# Patient Record
Sex: Female | Born: 1935
Health system: Southern US, Community
[De-identification: ages and names within clinical notes are randomized; demographics above are authoritative.]

## PROBLEM LIST (undated history)

## (undated) DIAGNOSIS — C801 Malignant (primary) neoplasm, unspecified: Secondary | ICD-10-CM

## (undated) DIAGNOSIS — K219 Gastro-esophageal reflux disease without esophagitis: Secondary | ICD-10-CM

## (undated) DIAGNOSIS — F419 Anxiety disorder, unspecified: Secondary | ICD-10-CM

## (undated) DIAGNOSIS — H269 Unspecified cataract: Secondary | ICD-10-CM

## (undated) DIAGNOSIS — I2510 Atherosclerotic heart disease of native coronary artery without angina pectoris: Secondary | ICD-10-CM

## (undated) DIAGNOSIS — T4145XA Adverse effect of unspecified anesthetic, initial encounter: Secondary | ICD-10-CM

## (undated) DIAGNOSIS — I6529 Occlusion and stenosis of unspecified carotid artery: Secondary | ICD-10-CM

## (undated) DIAGNOSIS — I472 Ventricular tachycardia: Secondary | ICD-10-CM

## (undated) DIAGNOSIS — I10 Essential (primary) hypertension: Secondary | ICD-10-CM

## (undated) DIAGNOSIS — K4090 Unilateral inguinal hernia, without obstruction or gangrene, not specified as recurrent: Secondary | ICD-10-CM

## (undated) DIAGNOSIS — E119 Type 2 diabetes mellitus without complications: Secondary | ICD-10-CM

## (undated) DIAGNOSIS — I4729 Other ventricular tachycardia: Secondary | ICD-10-CM

## (undated) DIAGNOSIS — E876 Hypokalemia: Secondary | ICD-10-CM

## (undated) DIAGNOSIS — T7840XA Allergy, unspecified, initial encounter: Secondary | ICD-10-CM

## (undated) DIAGNOSIS — T8859XA Other complications of anesthesia, initial encounter: Secondary | ICD-10-CM

## (undated) DIAGNOSIS — R739 Hyperglycemia, unspecified: Secondary | ICD-10-CM

## (undated) DIAGNOSIS — R0602 Shortness of breath: Secondary | ICD-10-CM

## (undated) DIAGNOSIS — M1990 Unspecified osteoarthritis, unspecified site: Secondary | ICD-10-CM

## (undated) HISTORY — DX: Unspecified cataract: H26.9

## (undated) HISTORY — DX: Malignant (primary) neoplasm, unspecified: C80.1

## (undated) HISTORY — DX: Allergy, unspecified, initial encounter: T78.40XA

## (undated) HISTORY — PX: OTHER SURGICAL HISTORY: SHX169

## (undated) HISTORY — DX: Atherosclerotic heart disease of native coronary artery without angina pectoris: I25.10

## (undated) HISTORY — DX: Gastro-esophageal reflux disease without esophagitis: K21.9

## (undated) HISTORY — DX: Essential (primary) hypertension: I10

## (undated) HISTORY — DX: Shortness of breath: R06.02

## (undated) HISTORY — PX: COLONOSCOPY: SHX174

## (undated) HISTORY — DX: Ventricular tachycardia: I47.2

## (undated) HISTORY — PX: REFRACTIVE SURGERY: SHX103

## (undated) HISTORY — DX: Hyperglycemia, unspecified: R73.9

## (undated) HISTORY — DX: Anxiety disorder, unspecified: F41.9

## (undated) HISTORY — DX: Occlusion and stenosis of unspecified carotid artery: I65.29

## (undated) HISTORY — DX: Other ventricular tachycardia: I47.29

## (undated) HISTORY — DX: Unspecified osteoarthritis, unspecified site: M19.90

## (undated) HISTORY — DX: Type 2 diabetes mellitus without complications: E11.9

## (undated) HISTORY — DX: Hypokalemia: E87.6

---

## 2001-10-16 ENCOUNTER — Ambulatory Visit (HOSPITAL_COMMUNITY): Admission: RE | Admit: 2001-10-16 | Discharge: 2001-10-16 | Payer: Self-pay | Admitting: Internal Medicine

## 2001-10-16 ENCOUNTER — Encounter: Payer: Self-pay | Admitting: Internal Medicine

## 2001-11-05 ENCOUNTER — Other Ambulatory Visit: Admission: RE | Admit: 2001-11-05 | Discharge: 2001-11-05 | Payer: Self-pay | Admitting: Obstetrics and Gynecology

## 2001-12-16 ENCOUNTER — Ambulatory Visit (HOSPITAL_COMMUNITY): Admission: RE | Admit: 2001-12-16 | Discharge: 2001-12-16 | Payer: Self-pay | Admitting: Internal Medicine

## 2002-06-24 ENCOUNTER — Emergency Department (HOSPITAL_COMMUNITY): Admission: EM | Admit: 2002-06-24 | Discharge: 2002-06-24 | Payer: Self-pay | Admitting: Emergency Medicine

## 2002-11-20 ENCOUNTER — Ambulatory Visit (HOSPITAL_COMMUNITY): Admission: RE | Admit: 2002-11-20 | Discharge: 2002-11-20 | Payer: Self-pay | Admitting: Internal Medicine

## 2002-11-20 ENCOUNTER — Encounter: Payer: Self-pay | Admitting: Internal Medicine

## 2003-06-04 ENCOUNTER — Encounter: Payer: Self-pay | Admitting: Internal Medicine

## 2003-06-04 ENCOUNTER — Ambulatory Visit (HOSPITAL_COMMUNITY): Admission: RE | Admit: 2003-06-04 | Discharge: 2003-06-04 | Payer: Self-pay | Admitting: Internal Medicine

## 2003-08-17 ENCOUNTER — Ambulatory Visit (HOSPITAL_COMMUNITY): Admission: RE | Admit: 2003-08-17 | Discharge: 2003-08-17 | Payer: Self-pay | Admitting: Family Medicine

## 2003-11-24 ENCOUNTER — Ambulatory Visit (HOSPITAL_COMMUNITY): Admission: RE | Admit: 2003-11-24 | Discharge: 2003-11-24 | Payer: Self-pay | Admitting: Family Medicine

## 2004-09-27 ENCOUNTER — Ambulatory Visit: Payer: Self-pay | Admitting: Family Medicine

## 2004-10-28 ENCOUNTER — Ambulatory Visit: Payer: Self-pay | Admitting: Family Medicine

## 2004-11-22 ENCOUNTER — Ambulatory Visit: Payer: Self-pay | Admitting: Family Medicine

## 2004-12-06 ENCOUNTER — Ambulatory Visit (HOSPITAL_COMMUNITY): Admission: RE | Admit: 2004-12-06 | Discharge: 2004-12-06 | Payer: Self-pay | Admitting: Family Medicine

## 2004-12-22 ENCOUNTER — Ambulatory Visit (HOSPITAL_COMMUNITY): Admission: RE | Admit: 2004-12-22 | Discharge: 2004-12-22 | Payer: Self-pay | Admitting: General Surgery

## 2005-01-24 ENCOUNTER — Ambulatory Visit: Payer: Self-pay | Admitting: Family Medicine

## 2005-01-26 ENCOUNTER — Ambulatory Visit (HOSPITAL_COMMUNITY): Admission: RE | Admit: 2005-01-26 | Discharge: 2005-01-26 | Payer: Self-pay | Admitting: Family Medicine

## 2005-02-20 ENCOUNTER — Ambulatory Visit (HOSPITAL_COMMUNITY): Admission: RE | Admit: 2005-02-20 | Discharge: 2005-02-20 | Payer: Self-pay | Admitting: Urology

## 2005-03-07 ENCOUNTER — Ambulatory Visit: Payer: Self-pay | Admitting: Family Medicine

## 2005-05-17 ENCOUNTER — Ambulatory Visit: Payer: Self-pay | Admitting: Family Medicine

## 2005-05-19 ENCOUNTER — Ambulatory Visit (HOSPITAL_COMMUNITY): Admission: RE | Admit: 2005-05-19 | Discharge: 2005-05-19 | Payer: Self-pay | Admitting: Family Medicine

## 2005-05-29 ENCOUNTER — Ambulatory Visit (HOSPITAL_COMMUNITY): Admission: RE | Admit: 2005-05-29 | Discharge: 2005-05-29 | Payer: Self-pay | Admitting: Family Medicine

## 2005-05-30 ENCOUNTER — Ambulatory Visit: Payer: Self-pay | Admitting: Family Medicine

## 2005-06-20 ENCOUNTER — Ambulatory Visit: Payer: Self-pay | Admitting: Family Medicine

## 2005-07-10 ENCOUNTER — Ambulatory Visit: Payer: Self-pay | Admitting: Family Medicine

## 2005-07-27 ENCOUNTER — Ambulatory Visit: Payer: Self-pay | Admitting: Family Medicine

## 2005-07-31 ENCOUNTER — Ambulatory Visit: Payer: Self-pay | Admitting: Family Medicine

## 2005-11-14 ENCOUNTER — Ambulatory Visit: Payer: Self-pay | Admitting: Family Medicine

## 2005-11-16 ENCOUNTER — Ambulatory Visit (HOSPITAL_COMMUNITY): Admission: RE | Admit: 2005-11-16 | Discharge: 2005-11-16 | Payer: Self-pay | Admitting: Family Medicine

## 2005-12-04 ENCOUNTER — Ambulatory Visit: Payer: Self-pay | Admitting: Family Medicine

## 2005-12-27 ENCOUNTER — Ambulatory Visit (HOSPITAL_COMMUNITY): Admission: RE | Admit: 2005-12-27 | Discharge: 2005-12-27 | Payer: Self-pay | Admitting: Urology

## 2005-12-28 ENCOUNTER — Ambulatory Visit: Payer: Self-pay | Admitting: Family Medicine

## 2006-01-04 ENCOUNTER — Ambulatory Visit (HOSPITAL_COMMUNITY): Admission: RE | Admit: 2006-01-04 | Discharge: 2006-01-04 | Payer: Self-pay | Admitting: Family Medicine

## 2006-01-25 ENCOUNTER — Other Ambulatory Visit: Admission: RE | Admit: 2006-01-25 | Discharge: 2006-01-25 | Payer: Self-pay | Admitting: Family Medicine

## 2006-01-25 ENCOUNTER — Encounter: Payer: Self-pay | Admitting: Family Medicine

## 2006-01-25 ENCOUNTER — Ambulatory Visit: Payer: Self-pay | Admitting: Family Medicine

## 2006-02-05 ENCOUNTER — Encounter (HOSPITAL_COMMUNITY): Admission: RE | Admit: 2006-02-05 | Discharge: 2006-03-07 | Payer: Self-pay | Admitting: Family Medicine

## 2006-03-15 ENCOUNTER — Ambulatory Visit (HOSPITAL_COMMUNITY): Admission: RE | Admit: 2006-03-15 | Discharge: 2006-03-15 | Payer: Self-pay | Admitting: Family Medicine

## 2006-04-27 ENCOUNTER — Ambulatory Visit: Payer: Self-pay | Admitting: Family Medicine

## 2006-06-06 ENCOUNTER — Ambulatory Visit: Payer: Self-pay | Admitting: Family Medicine

## 2006-06-15 ENCOUNTER — Ambulatory Visit: Payer: Self-pay | Admitting: Family Medicine

## 2006-06-29 ENCOUNTER — Ambulatory Visit: Payer: Self-pay | Admitting: Family Medicine

## 2006-09-03 ENCOUNTER — Ambulatory Visit: Payer: Self-pay | Admitting: Family Medicine

## 2006-12-05 ENCOUNTER — Ambulatory Visit: Payer: Self-pay | Admitting: Family Medicine

## 2006-12-06 ENCOUNTER — Encounter: Payer: Self-pay | Admitting: Family Medicine

## 2006-12-06 LAB — CONVERTED CEMR LAB
Basophils Relative: 0 % (ref 0–1)
Eosinophils Absolute: 0.2 10*3/uL (ref 0.0–0.7)
Glucose, Bld: 95 mg/dL (ref 70–99)
Lymphs Abs: 1.4 10*3/uL (ref 0.7–3.3)
MCV: 93.4 fL (ref 78.0–100.0)
Neutro Abs: 3.1 10*3/uL (ref 1.7–7.7)
Neutrophils Relative %: 59 % (ref 43–77)
Platelets: 239 10*3/uL (ref 150–400)
Potassium: 3.8 meq/L (ref 3.5–5.3)
Sodium: 142 meq/L (ref 135–145)
TSH: 0.856 microintl units/mL (ref 0.350–5.50)
WBC: 5.2 10*3/uL (ref 4.0–10.5)

## 2007-01-10 ENCOUNTER — Ambulatory Visit (HOSPITAL_COMMUNITY): Admission: RE | Admit: 2007-01-10 | Discharge: 2007-01-10 | Payer: Self-pay | Admitting: Family Medicine

## 2007-01-18 ENCOUNTER — Encounter (INDEPENDENT_AMBULATORY_CARE_PROVIDER_SITE_OTHER): Payer: Self-pay | Admitting: Specialist

## 2007-01-18 ENCOUNTER — Ambulatory Visit (HOSPITAL_COMMUNITY): Admission: RE | Admit: 2007-01-18 | Discharge: 2007-01-18 | Payer: Self-pay | Admitting: Family Medicine

## 2007-01-28 ENCOUNTER — Ambulatory Visit: Payer: Self-pay | Admitting: Family Medicine

## 2007-01-28 ENCOUNTER — Encounter: Payer: Self-pay | Admitting: Family Medicine

## 2007-01-28 ENCOUNTER — Other Ambulatory Visit: Admission: RE | Admit: 2007-01-28 | Discharge: 2007-01-28 | Payer: Self-pay | Admitting: Family Medicine

## 2007-01-28 LAB — CONVERTED CEMR LAB: Pap Smear: NORMAL

## 2007-04-24 ENCOUNTER — Ambulatory Visit: Payer: Self-pay | Admitting: Family Medicine

## 2007-04-24 ENCOUNTER — Ambulatory Visit (HOSPITAL_COMMUNITY): Admission: RE | Admit: 2007-04-24 | Discharge: 2007-04-24 | Payer: Self-pay | Admitting: Family Medicine

## 2007-04-25 ENCOUNTER — Encounter: Payer: Self-pay | Admitting: Family Medicine

## 2007-05-22 ENCOUNTER — Ambulatory Visit: Payer: Self-pay | Admitting: Family Medicine

## 2007-06-04 ENCOUNTER — Encounter: Payer: Self-pay | Admitting: Family Medicine

## 2007-06-04 LAB — CONVERTED CEMR LAB
Albumin: 4.3 g/dL (ref 3.5–5.2)
CO2: 33 meq/L — ABNORMAL HIGH (ref 19–32)
Chloride: 102 meq/L (ref 96–112)
HDL: 77 mg/dL (ref >39)
LDL Cholesterol: 96 mg/dL (ref 0–99)
Sodium: 147 meq/L — ABNORMAL HIGH (ref 135–145)
Total Bilirubin: 0.5 mg/dL (ref 0.3–1.2)
Total CHOL/HDL Ratio: 2.4

## 2007-06-18 ENCOUNTER — Ambulatory Visit: Payer: Self-pay | Admitting: Family Medicine

## 2007-07-15 ENCOUNTER — Encounter: Payer: Self-pay | Admitting: Family Medicine

## 2007-07-15 DIAGNOSIS — I1A0 Resistant hypertension: Secondary | ICD-10-CM | POA: Insufficient documentation

## 2007-07-15 DIAGNOSIS — E785 Hyperlipidemia, unspecified: Secondary | ICD-10-CM | POA: Insufficient documentation

## 2007-07-15 DIAGNOSIS — F411 Generalized anxiety disorder: Secondary | ICD-10-CM | POA: Insufficient documentation

## 2007-07-15 DIAGNOSIS — J301 Allergic rhinitis due to pollen: Secondary | ICD-10-CM | POA: Insufficient documentation

## 2007-07-15 DIAGNOSIS — M810 Age-related osteoporosis without current pathological fracture: Secondary | ICD-10-CM | POA: Insufficient documentation

## 2007-07-15 DIAGNOSIS — I10 Essential (primary) hypertension: Secondary | ICD-10-CM | POA: Insufficient documentation

## 2007-07-15 DIAGNOSIS — E782 Mixed hyperlipidemia: Secondary | ICD-10-CM | POA: Insufficient documentation

## 2007-07-15 HISTORY — DX: Generalized anxiety disorder: F41.1

## 2007-07-18 ENCOUNTER — Ambulatory Visit: Payer: Self-pay | Admitting: Family Medicine

## 2007-07-18 LAB — CONVERTED CEMR LAB
BUN: 13 mg/dL (ref 6–23)
Basophils Absolute: 0 10*3/uL (ref 0.0–0.1)
CO2: 32 meq/L (ref 19–32)
Chloride: 100 meq/L (ref 96–112)
Creatinine, Ser: 0.97 mg/dL (ref 0.40–1.20)
Hemoglobin: 11.9 g/dL — ABNORMAL LOW (ref 12.0–15.0)
Lymphocytes Relative: 24 % (ref 12–46)
Monocytes Absolute: 0.7 10*3/uL (ref 0.2–0.7)
Monocytes Relative: 9 % (ref 3–11)
Neutro Abs: 4.7 10*3/uL (ref 1.7–7.7)
RBC: 3.98 M/uL (ref 3.87–5.11)

## 2007-09-04 ENCOUNTER — Ambulatory Visit: Payer: Self-pay | Admitting: Family Medicine

## 2007-09-19 DIAGNOSIS — C801 Malignant (primary) neoplasm, unspecified: Secondary | ICD-10-CM

## 2007-09-19 HISTORY — DX: Malignant (primary) neoplasm, unspecified: C80.1

## 2007-09-19 HISTORY — PX: BREAST LUMPECTOMY: SHX2

## 2007-09-26 ENCOUNTER — Ambulatory Visit: Payer: Self-pay | Admitting: Family Medicine

## 2007-09-27 ENCOUNTER — Encounter: Payer: Self-pay | Admitting: Family Medicine

## 2007-10-01 ENCOUNTER — Ambulatory Visit (HOSPITAL_COMMUNITY): Admission: RE | Admit: 2007-10-01 | Discharge: 2007-10-01 | Payer: Self-pay | Admitting: Family Medicine

## 2007-12-20 ENCOUNTER — Ambulatory Visit: Payer: Self-pay | Admitting: Family Medicine

## 2007-12-25 ENCOUNTER — Ambulatory Visit: Payer: Self-pay | Admitting: Family Medicine

## 2007-12-27 ENCOUNTER — Encounter: Payer: Self-pay | Admitting: Family Medicine

## 2007-12-27 LAB — CONVERTED CEMR LAB
Basophils Absolute: 0 10*3/uL (ref 0.0–0.1)
Basophils Relative: 1 % (ref 0–1)
Calcium: 10.1 mg/dL (ref 8.4–10.5)
Chloride: 100 meq/L (ref 96–112)
Cholesterol: 202 mg/dL — ABNORMAL HIGH (ref 0–200)
Creatinine, Ser: 0.95 mg/dL (ref 0.40–1.20)
Eosinophils Absolute: 0.1 10*3/uL (ref 0.0–0.7)
Eosinophils Relative: 2 % (ref 0–5)
HDL: 95 mg/dL (ref >39)
Hemoglobin: 13.3 g/dL (ref 12.0–15.0)
MCHC: 32.4 g/dL (ref 30.0–36.0)
MCV: 94.1 fL (ref 78.0–100.0)
Monocytes Absolute: 0.5 10*3/uL (ref 0.1–1.0)
Neutro Abs: 3.6 10*3/uL (ref 1.7–7.7)
RDW: 14.7 % (ref 11.5–15.5)
Triglycerides: 45 mg/dL (ref <150)

## 2008-02-04 ENCOUNTER — Ambulatory Visit (HOSPITAL_COMMUNITY): Admission: RE | Admit: 2008-02-04 | Discharge: 2008-02-04 | Payer: Self-pay | Admitting: Family Medicine

## 2008-02-05 ENCOUNTER — Ambulatory Visit: Payer: Self-pay | Admitting: Family Medicine

## 2008-02-24 ENCOUNTER — Ambulatory Visit (HOSPITAL_COMMUNITY): Admission: RE | Admit: 2008-02-24 | Discharge: 2008-02-24 | Payer: Self-pay | Admitting: Family Medicine

## 2008-03-05 ENCOUNTER — Encounter: Admission: RE | Admit: 2008-03-05 | Discharge: 2008-03-05 | Payer: Self-pay | Admitting: Family Medicine

## 2008-03-05 ENCOUNTER — Encounter (INDEPENDENT_AMBULATORY_CARE_PROVIDER_SITE_OTHER): Payer: Self-pay | Admitting: Diagnostic Radiology

## 2008-03-11 ENCOUNTER — Ambulatory Visit: Payer: Self-pay | Admitting: Family Medicine

## 2008-03-13 ENCOUNTER — Ambulatory Visit (HOSPITAL_COMMUNITY): Admission: RE | Admit: 2008-03-13 | Discharge: 2008-03-13 | Payer: Self-pay | Admitting: Family Medicine

## 2008-04-07 ENCOUNTER — Ambulatory Visit: Payer: Self-pay | Admitting: Family Medicine

## 2008-04-13 ENCOUNTER — Encounter: Payer: Self-pay | Admitting: Family Medicine

## 2008-04-29 ENCOUNTER — Encounter: Admission: RE | Admit: 2008-04-29 | Discharge: 2008-04-29 | Payer: Self-pay | Admitting: General Surgery

## 2008-04-30 ENCOUNTER — Ambulatory Visit (HOSPITAL_BASED_OUTPATIENT_CLINIC_OR_DEPARTMENT_OTHER): Admission: RE | Admit: 2008-04-30 | Discharge: 2008-04-30 | Payer: Self-pay | Admitting: General Surgery

## 2008-04-30 ENCOUNTER — Encounter (HOSPITAL_BASED_OUTPATIENT_CLINIC_OR_DEPARTMENT_OTHER): Payer: Self-pay | Admitting: General Surgery

## 2008-04-30 ENCOUNTER — Encounter: Admission: RE | Admit: 2008-04-30 | Discharge: 2008-04-30 | Payer: Self-pay | Admitting: General Surgery

## 2008-05-11 ENCOUNTER — Encounter: Payer: Self-pay | Admitting: Family Medicine

## 2008-05-20 ENCOUNTER — Ambulatory Visit: Payer: Self-pay | Admitting: Family Medicine

## 2008-05-21 DIAGNOSIS — D0590 Unspecified type of carcinoma in situ of unspecified breast: Secondary | ICD-10-CM

## 2008-05-21 HISTORY — DX: Unspecified type of carcinoma in situ of unspecified breast: D05.90

## 2008-06-04 ENCOUNTER — Telehealth: Payer: Self-pay | Admitting: Family Medicine

## 2008-06-18 ENCOUNTER — Telehealth: Payer: Self-pay | Admitting: Family Medicine

## 2008-07-06 ENCOUNTER — Telehealth: Payer: Self-pay | Admitting: Family Medicine

## 2008-07-09 ENCOUNTER — Encounter: Payer: Self-pay | Admitting: Family Medicine

## 2008-07-09 ENCOUNTER — Ambulatory Visit (HOSPITAL_BASED_OUTPATIENT_CLINIC_OR_DEPARTMENT_OTHER): Admission: RE | Admit: 2008-07-09 | Discharge: 2008-07-09 | Payer: Self-pay | Admitting: General Surgery

## 2008-07-09 ENCOUNTER — Encounter (HOSPITAL_BASED_OUTPATIENT_CLINIC_OR_DEPARTMENT_OTHER): Payer: Self-pay | Admitting: General Surgery

## 2008-07-15 ENCOUNTER — Ambulatory Visit: Payer: Self-pay | Admitting: Family Medicine

## 2008-07-28 ENCOUNTER — Ambulatory Visit: Payer: Self-pay | Admitting: Oncology

## 2008-07-29 ENCOUNTER — Encounter: Payer: Self-pay | Admitting: Family Medicine

## 2008-08-11 ENCOUNTER — Encounter: Payer: Self-pay | Admitting: Family Medicine

## 2008-08-18 ENCOUNTER — Encounter: Payer: Self-pay | Admitting: Family Medicine

## 2008-08-31 ENCOUNTER — Ambulatory Visit: Payer: Self-pay | Admitting: Family Medicine

## 2008-08-31 DIAGNOSIS — E876 Hypokalemia: Secondary | ICD-10-CM | POA: Insufficient documentation

## 2008-08-31 DIAGNOSIS — J309 Allergic rhinitis, unspecified: Secondary | ICD-10-CM | POA: Insufficient documentation

## 2008-08-31 LAB — CONVERTED CEMR LAB
BUN: 20 mg/dL (ref 6–23)
CO2: 30 meq/L (ref 19–32)
Chloride: 98 meq/L (ref 96–112)
Creatinine, Ser: 0.91 mg/dL (ref 0.40–1.20)
Glucose, Bld: 82 mg/dL (ref 70–99)
Potassium: 3.2 meq/L — ABNORMAL LOW (ref 3.5–5.3)

## 2008-09-29 ENCOUNTER — Encounter: Admission: RE | Admit: 2008-09-29 | Discharge: 2008-09-29 | Payer: Self-pay | Admitting: Radiation Oncology

## 2008-09-29 ENCOUNTER — Encounter: Payer: Self-pay | Admitting: Family Medicine

## 2008-10-12 ENCOUNTER — Encounter: Payer: Self-pay | Admitting: Family Medicine

## 2008-10-14 ENCOUNTER — Encounter: Payer: Self-pay | Admitting: Family Medicine

## 2008-10-16 ENCOUNTER — Telehealth: Payer: Self-pay | Admitting: Family Medicine

## 2008-10-19 ENCOUNTER — Encounter: Payer: Self-pay | Admitting: Family Medicine

## 2008-10-21 ENCOUNTER — Encounter: Payer: Self-pay | Admitting: Family Medicine

## 2008-11-02 ENCOUNTER — Ambulatory Visit: Payer: Self-pay | Admitting: Family Medicine

## 2008-11-06 ENCOUNTER — Encounter: Payer: Self-pay | Admitting: Family Medicine

## 2008-11-06 LAB — CONVERTED CEMR LAB
ALT: 17 units/L (ref 0–35)
Alkaline Phosphatase: 72 units/L (ref 39–117)
BUN: 20 mg/dL (ref 6–23)
Bilirubin, Direct: 0.1 mg/dL (ref 0.0–0.3)
Cholesterol: 196 mg/dL (ref 0–200)
Creatinine, Ser: 0.95 mg/dL (ref 0.40–1.20)
Glucose, Bld: 94 mg/dL (ref 70–99)
Indirect Bilirubin: 0.4 mg/dL (ref 0.0–0.9)
Total Protein: 7.1 g/dL (ref 6.0–8.3)
Triglycerides: 53 mg/dL (ref <150)

## 2008-11-07 ENCOUNTER — Telehealth: Payer: Self-pay | Admitting: Family Medicine

## 2008-11-13 LAB — CONVERTED CEMR LAB
BUN: 20 mg/dL (ref 6–23)
Chloride: 101 meq/L (ref 96–112)
Creatinine, Ser: 1.14 mg/dL (ref 0.40–1.20)
Glucose, Bld: 121 mg/dL — ABNORMAL HIGH (ref 70–99)

## 2008-11-23 ENCOUNTER — Encounter: Payer: Self-pay | Admitting: Family Medicine

## 2008-11-26 LAB — CONVERTED CEMR LAB
Glucose, 2 hour: 139 mg/dL (ref 70–139)
Glucose, Fasting: 119 mg/dL — ABNORMAL HIGH (ref 70–99)

## 2008-12-08 ENCOUNTER — Encounter: Payer: Self-pay | Admitting: Family Medicine

## 2009-02-17 ENCOUNTER — Ambulatory Visit: Payer: Self-pay | Admitting: Family Medicine

## 2009-02-24 LAB — CONVERTED CEMR LAB
ALT: 11 units/L (ref 0–35)
AST: 17 units/L (ref 0–37)
Alkaline Phosphatase: 60 units/L (ref 39–117)
BUN: 20 mg/dL (ref 6–23)
Basophils Absolute: 0 10*3/uL (ref 0.0–0.1)
Basophils Relative: 0 % (ref 0–1)
Bilirubin, Direct: 0.1 mg/dL (ref 0.0–0.3)
Calcium: 9.7 mg/dL (ref 8.4–10.5)
Cholesterol: 163 mg/dL (ref 0–200)
Creatinine, Ser: 0.92 mg/dL (ref 0.40–1.20)
Eosinophils Relative: 3 % (ref 0–5)
Glucose, Bld: 103 mg/dL — ABNORMAL HIGH (ref 70–99)
HCT: 38.8 % (ref 36.0–46.0)
Hemoglobin: 12.6 g/dL (ref 12.0–15.0)
Indirect Bilirubin: 0.2 mg/dL (ref 0.0–0.9)
Lymphocytes Relative: 25 % (ref 12–46)
MCHC: 32.5 g/dL (ref 30.0–36.0)
Monocytes Absolute: 0.6 10*3/uL (ref 0.1–1.0)
Platelets: 248 10*3/uL (ref 150–400)
Potassium: 3.2 meq/L — ABNORMAL LOW (ref 3.5–5.3)
RDW: 14.7 % (ref 11.5–15.5)
Total Protein: 6.9 g/dL (ref 6.0–8.3)
Triglycerides: 74 mg/dL (ref <150)
VLDL: 15 mg/dL (ref 0–40)

## 2009-02-25 ENCOUNTER — Ambulatory Visit (HOSPITAL_COMMUNITY): Admission: RE | Admit: 2009-02-25 | Discharge: 2009-02-25 | Payer: Self-pay | Admitting: Internal Medicine

## 2009-02-25 ENCOUNTER — Ambulatory Visit (HOSPITAL_COMMUNITY): Admission: RE | Admit: 2009-02-25 | Discharge: 2009-02-25 | Payer: Self-pay | Admitting: Family Medicine

## 2009-05-11 ENCOUNTER — Encounter: Payer: Self-pay | Admitting: Family Medicine

## 2009-05-27 ENCOUNTER — Other Ambulatory Visit: Admission: RE | Admit: 2009-05-27 | Discharge: 2009-05-27 | Payer: Self-pay | Admitting: Family Medicine

## 2009-05-27 ENCOUNTER — Ambulatory Visit: Payer: Self-pay | Admitting: Family Medicine

## 2009-05-27 ENCOUNTER — Encounter: Payer: Self-pay | Admitting: Family Medicine

## 2009-05-27 LAB — CONVERTED CEMR LAB
Bilirubin Urine: NEGATIVE
Blood in Urine, dipstick: NEGATIVE
Glucose, Urine, Semiquant: NEGATIVE
Ketones, urine, test strip: NEGATIVE
Nitrite: NEGATIVE
OCCULT 1: NEGATIVE
Protein, U semiquant: NEGATIVE
Specific Gravity, Urine: 1.015
Urobilinogen, UA: 0.2
WBC Urine, dipstick: NEGATIVE
pH: 8.5

## 2009-06-04 LAB — CONVERTED CEMR LAB
Albumin: 4 g/dL (ref 3.5–5.2)
Alkaline Phosphatase: 50 units/L (ref 39–117)
Bilirubin, Direct: 0.1 mg/dL (ref 0.0–0.3)
CO2: 28 meq/L (ref 19–32)
Chloride: 102 meq/L (ref 96–112)
Creatinine, Ser: 0.81 mg/dL (ref 0.40–1.20)
Glucose, Bld: 112 mg/dL — ABNORMAL HIGH (ref 70–99)
HDL: 69 mg/dL (ref >39)
LDL Cholesterol: 93 mg/dL (ref 0–99)
TSH: 1.353 microintl units/mL (ref 0.350–4.500)
Total Bilirubin: 0.5 mg/dL (ref 0.3–1.2)
Total CHOL/HDL Ratio: 2.6

## 2009-06-18 ENCOUNTER — Encounter: Payer: Self-pay | Admitting: Family Medicine

## 2009-07-28 ENCOUNTER — Telehealth: Payer: Self-pay | Admitting: Family Medicine

## 2009-07-29 ENCOUNTER — Ambulatory Visit: Payer: Self-pay | Admitting: Family Medicine

## 2009-08-05 ENCOUNTER — Ambulatory Visit: Payer: Self-pay | Admitting: Family Medicine

## 2009-08-26 ENCOUNTER — Telehealth: Payer: Self-pay | Admitting: Family Medicine

## 2009-08-27 ENCOUNTER — Telehealth: Payer: Self-pay | Admitting: Family Medicine

## 2009-09-15 ENCOUNTER — Encounter: Payer: Self-pay | Admitting: Family Medicine

## 2009-09-27 ENCOUNTER — Ambulatory Visit (HOSPITAL_COMMUNITY): Admission: RE | Admit: 2009-09-27 | Discharge: 2009-09-27 | Payer: Self-pay | Admitting: Family Medicine

## 2009-09-27 ENCOUNTER — Telehealth: Payer: Self-pay | Admitting: Family Medicine

## 2009-10-07 ENCOUNTER — Telehealth: Payer: Self-pay | Admitting: Family Medicine

## 2009-10-07 ENCOUNTER — Encounter: Payer: Self-pay | Admitting: Family Medicine

## 2009-10-20 ENCOUNTER — Encounter: Payer: Self-pay | Admitting: Family Medicine

## 2009-11-15 ENCOUNTER — Telehealth: Payer: Self-pay | Admitting: Family Medicine

## 2009-11-15 ENCOUNTER — Ambulatory Visit: Payer: Self-pay | Admitting: Family Medicine

## 2009-11-15 LAB — CONVERTED CEMR LAB
Bilirubin Urine: NEGATIVE
Glucose, Urine, Semiquant: NEGATIVE
Ketones, urine, test strip: NEGATIVE
pH: 7

## 2009-11-17 ENCOUNTER — Encounter: Payer: Self-pay | Admitting: Physician Assistant

## 2009-11-17 ENCOUNTER — Telehealth: Payer: Self-pay | Admitting: Family Medicine

## 2009-11-24 ENCOUNTER — Encounter: Payer: Self-pay | Admitting: Family Medicine

## 2009-12-15 ENCOUNTER — Ambulatory Visit: Payer: Self-pay | Admitting: Family Medicine

## 2009-12-15 DIAGNOSIS — N3000 Acute cystitis without hematuria: Secondary | ICD-10-CM | POA: Insufficient documentation

## 2009-12-15 LAB — CONVERTED CEMR LAB
Glucose, Urine, Semiquant: NEGATIVE
Specific Gravity, Urine: 1.01
WBC Urine, dipstick: NEGATIVE
pH: 7

## 2009-12-17 ENCOUNTER — Encounter: Payer: Self-pay | Admitting: Family Medicine

## 2009-12-22 ENCOUNTER — Encounter: Payer: Self-pay | Admitting: Family Medicine

## 2009-12-31 ENCOUNTER — Telehealth: Payer: Self-pay | Admitting: Family Medicine

## 2010-02-04 ENCOUNTER — Telehealth: Payer: Self-pay | Admitting: Family Medicine

## 2010-03-02 LAB — CONVERTED CEMR LAB
Calcium: 9 mg/dL (ref 8.4–10.5)
Creatinine, Ser: 0.87 mg/dL (ref 0.40–1.20)
Hgb A1c MFr Bld: 6.5 % — ABNORMAL HIGH (ref <5.7)

## 2010-03-04 ENCOUNTER — Ambulatory Visit: Payer: Self-pay | Admitting: Family Medicine

## 2010-03-04 DIAGNOSIS — R5383 Other fatigue: Secondary | ICD-10-CM

## 2010-03-04 DIAGNOSIS — R5381 Other malaise: Secondary | ICD-10-CM | POA: Insufficient documentation

## 2010-03-09 ENCOUNTER — Encounter: Payer: Self-pay | Admitting: Family Medicine

## 2010-03-17 LAB — CONVERTED CEMR LAB
ALT: 14 units/L (ref 0–35)
AST: 24 units/L (ref 0–37)
Basophils Relative: 0 % (ref 0–1)
Bilirubin, Direct: 0.1 mg/dL (ref 0.0–0.3)
Cholesterol: 162 mg/dL (ref 0–200)
Indirect Bilirubin: 0.3 mg/dL (ref 0.0–0.9)
MCHC: 32.1 g/dL (ref 30.0–36.0)
Monocytes Relative: 7 % (ref 3–12)
Neutro Abs: 4.3 10*3/uL (ref 1.7–7.7)
Neutrophils Relative %: 63 % (ref 43–77)
RBC: 4.18 M/uL (ref 3.87–5.11)
Total CHOL/HDL Ratio: 2.1
WBC: 6.8 10*3/uL (ref 4.0–10.5)

## 2010-03-24 ENCOUNTER — Telehealth: Payer: Self-pay | Admitting: Family Medicine

## 2010-03-28 ENCOUNTER — Ambulatory Visit: Payer: Self-pay | Admitting: Family Medicine

## 2010-03-28 LAB — CONVERTED CEMR LAB
Blood Glucose, Fasting: 111 mg/dL
Blood in Urine, dipstick: NEGATIVE
Nitrite: NEGATIVE
Protein, U semiquant: NEGATIVE
Urobilinogen, UA: 0.2

## 2010-03-29 ENCOUNTER — Encounter: Payer: Self-pay | Admitting: Family Medicine

## 2010-04-27 ENCOUNTER — Ambulatory Visit (HOSPITAL_COMMUNITY): Admission: RE | Admit: 2010-04-27 | Discharge: 2010-04-27 | Payer: Self-pay | Admitting: Internal Medicine

## 2010-04-27 ENCOUNTER — Telehealth: Payer: Self-pay | Admitting: Family Medicine

## 2010-04-28 ENCOUNTER — Ambulatory Visit: Payer: Self-pay | Admitting: Family Medicine

## 2010-04-28 LAB — CONVERTED CEMR LAB
Ketones, urine, test strip: NEGATIVE
Nitrite: NEGATIVE
Protein, U semiquant: NEGATIVE
Urobilinogen, UA: 0.2

## 2010-04-29 ENCOUNTER — Encounter: Payer: Self-pay | Admitting: Family Medicine

## 2010-04-29 LAB — CONVERTED CEMR LAB
Creatinine, Urine: 37.2 mg/dL
Microalb Creat Ratio: 140.1 mg/g — ABNORMAL HIGH (ref 0.0–30.0)
Microalb, Ur: 5.21 mg/dL — ABNORMAL HIGH (ref 0.00–1.89)

## 2010-05-03 ENCOUNTER — Encounter: Payer: Self-pay | Admitting: Family Medicine

## 2010-05-07 DIAGNOSIS — IMO0001 Reserved for inherently not codable concepts without codable children: Secondary | ICD-10-CM | POA: Insufficient documentation

## 2010-06-28 ENCOUNTER — Ambulatory Visit: Payer: Self-pay | Admitting: Family Medicine

## 2010-06-28 ENCOUNTER — Inpatient Hospital Stay (HOSPITAL_COMMUNITY): Admission: AD | Admit: 2010-06-28 | Discharge: 2010-07-06 | Payer: Self-pay

## 2010-06-28 DIAGNOSIS — E870 Hyperosmolality and hypernatremia: Secondary | ICD-10-CM | POA: Insufficient documentation

## 2010-06-28 LAB — CONVERTED CEMR LAB
Chloride: 102 meq/L (ref 96–112)
Potassium: 2.8 meq/L — ABNORMAL LOW (ref 3.5–5.3)

## 2010-06-30 LAB — CONVERTED CEMR LAB

## 2010-07-07 ENCOUNTER — Telehealth: Payer: Self-pay | Admitting: Family Medicine

## 2010-07-11 ENCOUNTER — Ambulatory Visit: Payer: Self-pay | Admitting: Family Medicine

## 2010-07-27 ENCOUNTER — Telehealth: Payer: Self-pay | Admitting: Family Medicine

## 2010-07-27 LAB — CONVERTED CEMR LAB
BUN: 23 mg/dL (ref 6–23)
Chloride: 102 meq/L (ref 96–112)
Creatinine, Ser: 1.07 mg/dL (ref 0.40–1.20)
Glucose, Bld: 136 mg/dL — ABNORMAL HIGH (ref 70–99)

## 2010-10-09 ENCOUNTER — Encounter: Payer: Self-pay | Admitting: Family Medicine

## 2010-10-09 ENCOUNTER — Encounter: Payer: Self-pay | Admitting: Radiation Oncology

## 2010-10-11 ENCOUNTER — Telehealth: Payer: Self-pay | Admitting: Family Medicine

## 2010-10-18 NOTE — Assessment & Plan Note (Signed)
Summary: per dr   Vital Signs:  Patient profile:   77 year old female Menstrual status:  postmenopausal Height:      61 inches Weight:      128.50 pounds BMI:     24.37 O2 Sat:      98 % Pulse rate:   86 / minute Pulse rhythm:   regular Resp:     16 per minute BP sitting:   144 / 76  (left arm) Cuff size:   regular  Vitals Entered By: Kate Sable LPN (June 17, 624THL 075-GRM AM) CC: Follow up visit, per dr. Moshe Cipro   CC:  Follow up visit and per dr. Moshe Cipro.  History of Present Illness: pt is in as a new diabetic, based on a recent lab result of HBA1C of 6.5. She denies polyuria, polydypsia, blurred vision. She does state that shehas been eating alot of fruit recently, and feels this is why her sugaris elevated. She has had several recent tick exposure, mostt recently a red puritic lesion on her right shoulder and she isrequesting med for this. She denies myalgias or fever  Current Medications (verified): 1)  Metoprolol Tartrate 25 Mg  Tabs (Metoprolol Tartrate) .... Take 1 Tablet By Mouth Once A Day 2)  Klor-Con M20 20 Meq  Tbcr (Potassium Chloride Crys Cr) .... Four Tablets Alt. With Three Tablets Daily 3)  Cetirizine Hcl 10 Mg  Tabs (Cetirizine Hcl) .... Take 1 Tablet By Mouth Once A Day 4)  Oscal 500/200 D-3 500-200 Mg-Unit Tabs (Calcium-Vitamin D) .... One Tab By Mouth Tid 5)  Nitrostat 0.4 Mg Subl (Nitroglycerin) .... Uad 6)  Alprazolam 0.5 Mg Tabs (Alprazolam) .... One Tab By Mouth Qhs 7)  Multivitamins  Tabs (Multiple Vitamin) .... Take 1 Tablet By Mouth Once A Day 8)  Vitamin C 500 Mg Tabs (Ascorbic Acid) .... Take 1 Tablet By Mouth Once A Day 9)  Tamoxifen Citrate 20 Mg Tabs (Tamoxifen Citrate) .... Take 1 Tablet By Mouth Once A Day 10)  Flonase 50 Mcg/act Susp (Fluticasone Propionate) .... 2 Puffs Per Nostril Per Day As Needed 11)  Losartan Potassium-Hctz 100-25 Mg Tabs (Losartan Potassium-Hctz) .... Take 1 Tablet By Mouth Once A Day 12)  Amlodipine Besylate 10 Mg Tabs  (Amlodipine Besylate) .... Take 1 Tablet By Mouth Once A Day 13)  Fosamax 70 Mg Tabs (Alendronate Sodium) .... One Tab By Mouth Q Week 14)  Simvastatin 20 Mg Tabs (Simvastatin) .... Take 1 Tab By Mouth At Bedtime  Allergies (verified): 1)  ! Citalopram  Review of Systems      See HPI General:  Denies chills and fever. Eyes:  Denies discharge and red eye. ENT:  Denies hoarseness, nasal congestion, sinus pressure, and sore throat. CV:  Denies chest pain or discomfort, palpitations, and swelling of feet. Resp:  Denies cough and sputum productive. GI:  Denies abdominal pain, constipation, diarrhea, nausea, and vomiting. GU:  Denies dysuria and urinary frequency. MS:  Denies joint pain and stiffness. Derm:  Complains of insect bite(s), itching, and lesion(s). Neuro:  Denies inability to speak and seizures. Psych:  Denies anxiety and depression. Endo:  Denies excessive thirst and excessive urination. Heme:  Denies abnormal bruising and bleeding. Allergy:  Complains of seasonal allergies.  Physical Exam  General:  Well-developed,well-nourished,in no acute distress; alert,appropriate and cooperative throughout examination HEENT: No facial asymmetry,  EOMI, No sinus tenderness, TM's Clear, oropharynx  pink and moist.   Chest: Clear to auscultation bilaterally.  CVS: S1, S2, No murmurs,  No S3.   Abd: Soft, Nontender.  MS: Adequate ROM spine, hips, shoulders and knees.  Ext: No edema.   CNS: CN 2-12 intact, power tone and sensation normal throughout.   Skin: Intact,erythematous mildly tender macular rash on  the right shoulder Psych: Good eye contact, normal affect.  Memory intact, not anxious or depressed appearing.    Impression & Recommendations:  Problem # 1:  DIABETES MELLITUS, TYPE II (ICD-250.00) Assessment Comment Only  Her updated medication list for this problem includes:    Losartan Potassium-hctz 100-25 Mg Tabs (Losartan potassium-hctz) .Marland Kitchen... Take 1 tablet by mouth once  a day  Labs Reviewed: Creat: 0.87 (03/01/2010)    Reviewed HgBA1c results: 6.5 (03/01/2010), new dx wil, attempt lifestyle change only with regular testing, educationstarted in office, will refer to class  Problem # 2:  TICK BITE (ICD-E906.4) Assessment: Comment Only doxycycline prescribed and pt advised to wearprotective clothing when outside and check skin regularly  Problem # 3:  HYPERTENSION (ICD-401.9) Assessment: Deteriorated  Her updated medication list for this problem includes:    Metoprolol Tartrate 25 Mg Tabs (Metoprolol tartrate) .Marland Kitchen... Take 1 tablet by mouth once a day    Losartan Potassium-hctz 100-25 Mg Tabs (Losartan potassium-hctz) .Marland Kitchen... Take 1 tablet by mouth once a day    Amlodipine Besylate 10 Mg Tabs (Amlodipine besylate) .Marland Kitchen... Take 1 tablet by mouth once a day  BP today: 144/76 Prior BP: 126/74 (12/15/2009)  Labs Reviewed: K+: 3.8 (03/01/2010) Creat: : 0.87 (03/01/2010)   Chol: 176 (06/03/2009)   HDL: 69 (06/03/2009)   LDL: 93 (06/03/2009)   TG: 68 (06/03/2009)  Problem # 4:  HYPERLIPIDEMIA (ICD-272.4) Assessment: Comment Only  Her updated medication list for this problem includes:    Simvastatin 20 Mg Tabs (Simvastatin) .Marland Kitchen... Take 1 tab by mouth at bedtime  Labs Reviewed: SGOT: 26 (06/03/2009)   SGPT: 14 (06/03/2009)   HDL:69 (06/03/2009), 81 (02/23/2009)  LDL:93 (06/03/2009), 67 (02/23/2009)  Chol:176 (06/03/2009), 163 (02/23/2009)  Trig:68 (06/03/2009), 74 (02/23/2009) fasting labs past due need to be done asap  Complete Medication List: 1)  Metoprolol Tartrate 25 Mg Tabs (Metoprolol tartrate) .... Take 1 tablet by mouth once a day 2)  Klor-con M20 20 Meq Tbcr (Potassium chloride crys cr) .... Four tablets alt. with three tablets daily 3)  Cetirizine Hcl 10 Mg Tabs (Cetirizine hcl) .... Take 1 tablet by mouth once a day 4)  Oscal 500/200 D-3 500-200 Mg-unit Tabs (Calcium-vitamin d) .... One tab by mouth tid 5)  Nitrostat 0.4 Mg Subl (Nitroglycerin)  .... Uad 6)  Alprazolam 0.5 Mg Tabs (Alprazolam) .... One tab by mouth qhs 7)  Multivitamins Tabs (Multiple vitamin) .... Take 1 tablet by mouth once a day 8)  Vitamin C 500 Mg Tabs (Ascorbic acid) .... Take 1 tablet by mouth once a day 9)  Tamoxifen Citrate 20 Mg Tabs (Tamoxifen citrate) .... Take 1 tablet by mouth once a day 10)  Flonase 50 Mcg/act Susp (Fluticasone propionate) .... 2 puffs per nostril per day as needed 11)  Losartan Potassium-hctz 100-25 Mg Tabs (Losartan potassium-hctz) .... Take 1 tablet by mouth once a day 12)  Amlodipine Besylate 10 Mg Tabs (Amlodipine besylate) .... Take 1 tablet by mouth once a day 13)  Fosamax 70 Mg Tabs (Alendronate sodium) .... One tab by mouth q week 14)  Simvastatin 20 Mg Tabs (Simvastatin) .... Take 1 tab by mouth at bedtime 15)  Doxycycline Hyclate 100 Mg Caps (Doxycycline hyclate) .... Take 1 capsule by mouth two  times a day  Patient Instructions: 1)  F/u in 6 weeks with your diary. 2)  nurse to e`xplain and teach re diabetes. 3)  need to test blood sugars evry morning before breakfat and record. 4)  fasting sugars, should be between 80 to 120. 5)  you will be referred to [facility] in [place] 6)  no meds now, but if after 2 weeks your sugars are still high pls call so i can start you on meds Prescriptions: DOXYCYCLINE HYCLATE 100 MG CAPS (DOXYCYCLINE HYCLATE) Take 1 capsule by mouth two times a day  #14 x 0   Entered and Authorized by:   Tula Nakayama MD   Signed by:   Tula Nakayama MD on 03/04/2010   Method used:   Electronically to        [facility]. [phone]* (retail)       [address]       [place], [place]  [address]       Ph: [phone] or [phone]       Fax: [phone]   RxID:   [id number]

## 2010-10-18 NOTE — Assessment & Plan Note (Signed)
Summary: office visit per doc   Vital Signs:  Patient profile:   77 year old female Menstrual status:  postmenopausal Height:      61 inches Weight:      125 pounds BMI:     23.70 O2 Sat:      98 % Pulse rate:   68 / minute Pulse rhythm:   regular Resp:     16 per minute BP sitting:   120 / 80  (left arm)  Vitals Entered By: Kate Sable LPN (July 11, 624THL 579FGE AM) CC: has cut back on sweets and carbs and she is making lifestyle changes   CC:  has cut back on sweets and carbs and she is making lifestyle changes.  History of Present Illness: Reports  that she has been doing fairly well. Denies recent fever or chills. She has reduced her carbs and sweets and is walking regulalrly, all in an effort to control her blood sugars.She has sporadically checked her blood sugars, and her fastings range between 120 to 140, most often over 130. She denies polyuria, polydypsia, blurred vision or hypoglycemic episodes. Denies sinus pressure, nasal congestion , ear pain or sore throat. Denies chest congestion, or cough productive of sputum. Denies chest pain, palpitations, PND, orthopnea or leg swelling. Denies abdominal pain, nausea, vomitting, diarrhea or constipation. Denies change in bowel movements or bloody stool.  Denies  joint pain, swelling, or reduced mobility. Denies headaches, vertigo, seizures. Denies depression,  or insomnia.Her anxiety is improved and controlled on medication Denies  rash, lesions, or itch.     Current Medications (verified): 1)  Metoprolol Tartrate 25 Mg  Tabs (Metoprolol Tartrate) .... Take 1 Tablet By Mouth Once A Day 2)  Klor-Con M20 20 Meq  Tbcr (Potassium Chloride Crys Cr) .... Four Tablets Alt. With Three Tablets Daily 3)  Cetirizine Hcl 10 Mg  Tabs (Cetirizine Hcl) .... Take 1 Tablet By Mouth Once A Day 4)  Oscal 500/200 D-3 500-200 Mg-Unit Tabs (Calcium-Vitamin D) .... One Tab By Mouth Tid 5)  Alprazolam 0.5 Mg Tabs (Alprazolam) .... One Tab By  Mouth Qhs 6)  Multivitamins  Tabs (Multiple Vitamin) .... Take 1 Tablet By Mouth Once A Day 7)  Tamoxifen Citrate 20 Mg Tabs (Tamoxifen Citrate) .... Take 1 Tablet By Mouth Once A Day 8)  Losartan Potassium-Hctz 100-25 Mg Tabs (Losartan Potassium-Hctz) .... Take 1 Tablet By Mouth Once A Day 9)  Amlodipine Besylate 10 Mg Tabs (Amlodipine Besylate) .... Take 1 Tablet By Mouth Once A Day 10)  Simvastatin 20 Mg Tabs (Simvastatin) .... Take 1 Tab By Mouth At Bedtime 11)  Accu Chek Strips and Lancets .... Once Daily Testing 12)  Accu-Chek Aviva  Strp (Glucose Blood) .... Once Daily Testing 13)  Accu-Chek Soft Touch Lancets  Misc (Lancets) .... Once Daily Testing  Allergies (verified): 1)  ! Citalopram  Review of Systems      See HPI Eyes:  Complains of vision loss-both eyes; denies discharge, eye pain, and red eye; wears corrective lenses. GU:  Complains of dysuria and urinary frequency; 3 week h/o malodors urine, no flank pain, fever or chills. Endo:  Complains of excessive urination; fasting sugars 118,121,120,132,140. Heme:  Denies abnormal bruising and bleeding. Allergy:  Denies hives or rash and itching eyes.   Impression & Recommendations:  Problem # 1:  DIABETES MELLITUS, TYPE II (ICD-250.00) Assessment Unchanged  Her updated medication list for this problem includes:    Losartan Potassium-hctz 100-25 Mg Tabs (Losartan potassium-hctz) .Marland Kitchen... Take  1 tablet by mouth once a day    Metformin Hcl 500 Mg Tabs (Metformin hcl) ..... One half tablet once daily  Orders: Glucose, (CBG) [id number]), fasting today is 111  Problem # 2:  ALLERGIC RHINITIS (ICD-477.9) Assessment: Improved  The following medications were removed from the medication list:    Flonase 50 Mcg/act Susp (Fluticasone propionate) .Marland Kitchen... 2 puffs per nostril per day as needed Her updated medication list for this problem includes:    Cetirizine Hcl 10 Mg Tabs (Cetirizine hcl) .Marland Kitchen... Take 1 tablet by mouth once a day  Problem  # 3:  ANXIETY DISORDER, GENERALIZED (ICD-300.02) Assessment: Improved  Her updated medication list for this problem includes:    Alprazolam 0.5 Mg Tabs (Alprazolam) ..... One tab by mouth qhs  Problem # 4:  HYPERTENSION (ICD-401.9) Assessment: Improved  Her updated medication list for this problem includes:    Metoprolol Tartrate 25 Mg Tabs (Metoprolol tartrate) .Marland Kitchen... Take 1 tablet by mouth once a day    Losartan Potassium-hctz 100-25 Mg Tabs (Losartan potassium-hctz) .Marland Kitchen... Take 1 tablet by mouth once a day    Amlodipine Besylate 10 Mg Tabs (Amlodipine besylate) .Marland Kitchen... Take 1 tablet by mouth once a day  BP today: 120/80 Prior BP: 144/76 (03/04/2010)  Labs Reviewed: K+: 3.8 (03/01/2010) Creat: : 0.87 (03/01/2010)   Chol: 162 (03/16/2010)   HDL: 79 (03/16/2010)   LDL: 72 (03/16/2010)   TG: 55 (03/16/2010)  Problem # 5:  HYPERLIPIDEMIA (ICD-272.4) Assessment: Improved  Her updated medication list for this problem includes:    Simvastatin 20 Mg Tabs (Simvastatin) .Marland Kitchen... Take 1 tab by mouth at bedtime  Labs Reviewed: SGOT: 24 (03/16/2010)   SGPT: 14 (03/16/2010)   HDL:79 (03/16/2010), 69 (06/03/2009)  LDL:72 (03/16/2010), 93 (06/03/2009)  Chol:162 (03/16/2010), 176 (06/03/2009)  Trig:55 (03/16/2010), 68 (06/03/2009)  Problem # 6:  ACUTE CYSTITIS (ICD-595.0) Assessment: Comment Only  The following medications were removed from the medication list:    Doxycycline Hyclate 100 Mg Caps (Doxycycline hyclate) .Marland Kitchen... Take 1 capsule by mouth two times a day Her updated medication list for this problem includes:    Ciprofloxacin Hcl 500 Mg Tabs (Ciprofloxacin hcl) .Marland Kitchen... Take 1 tablet by mouth two times a day  Orders: UA Dipstick W/ Micro (manual) (81000) T-Culture, Urine [id number])  Encouraged to push clear liquids, get enough rest, and take acetaminophen as needed. To be seen in 10 days if no improvement, sooner if worse.  Complete Medication List: 1)  Metoprolol Tartrate 25 Mg Tabs  (Metoprolol tartrate) .... Take 1 tablet by mouth once a day 2)  Klor-con M20 20 Meq Tbcr (Potassium chloride crys cr) .... Four tablets alt. with three tablets daily 3)  Cetirizine Hcl 10 Mg Tabs (Cetirizine hcl) .... Take 1 tablet by mouth once a day 4)  Oscal 500/200 D-3 500-200 Mg-unit Tabs (Calcium-vitamin d) .... One tab by mouth tid 5)  Alprazolam 0.5 Mg Tabs (Alprazolam) .... One tab by mouth qhs 6)  Multivitamins Tabs (Multiple vitamin) .... Take 1 tablet by mouth once a day 7)  Tamoxifen Citrate 20 Mg Tabs (Tamoxifen citrate) .... Take 1 tablet by mouth once a day 8)  Losartan Potassium-hctz 100-25 Mg Tabs (Losartan potassium-hctz) .... Take 1 tablet by mouth once a day 9)  Amlodipine Besylate 10 Mg Tabs (Amlodipine besylate) .... Take 1 tablet by mouth once a day 10)  Simvastatin 20 Mg Tabs (Simvastatin) .... Take 1 tab by mouth at bedtime 11)  Accu Chek Strips and Lancets  .... Once  daily testing 12)  Accu-chek Aviva Strp (Glucose blood) .... Once daily testing 13)  Accu-chek Soft Touch Lancets Misc (Lancets) .... Once daily testing 14)  Metformin Hcl 500 Mg Tabs (Metformin hcl) .... One half tablet once daily 15)  Ciprofloxacin Hcl 500 Mg Tabs (Ciprofloxacin hcl) .... Take 1 tablet by mouth two times a day  Patient Instructions: 1)  F/U September 16 or after. 2)  New med for you blood sugar , you are diabetic. 3)  You are being treated for a UTI Prescriptions: CIPROFLOXACIN HCL 500 MG TABS (CIPROFLOXACIN HCL) Take 1 tablet by mouth two times a day  #14 x 0   Entered and Authorized by:   Tula Nakayama MD   Signed by:   Tula Nakayama MD on 03/28/2010   Method used:   Electronically to        [facility]. [phone]* (retail)       [address]       [place], [place]  [address]       Ph: [phone] or [phone]       Fax: [phone]   RxID:   [id number] METFORMIN HCL 500 MG TABS (METFORMIN HCL) one half tablet once daily  #16 x 2   Entered and  Authorized by:   Tula Nakayama MD   Signed by:   Tula Nakayama MD on 03/28/2010   Method used:   Electronically to        [facility]. [phone]* (retail)       [address]       [place], [place]  [address]       Ph: [phone] or [phone]       Fax: [phone]   RxID:   [id number]   Laboratory Results   Urine Tests    Routine Urinalysis   Color: straw Appearance: Clear Glucose: negative   (Normal Range: Negative) Bilirubin: negative   (Normal Range: Negative) Ketone: negative   (Normal Range: Negative) Spec. Gravity: 1.015   (Normal Range: 1.003-1.035) Blood: negative   (Normal Range: Negative) pH: 8.0   (Normal Range: 5.0-8.0) Protein: negative   (Normal Range: Negative) Urobilinogen: 0.2   (Normal Range: 0-1) Nitrite: negative   (Normal Range: Negative) Leukocyte Esterace: small   (Normal Range: Negative)     Blood Tests     Glucose (fasting): 111 mg/dL   (Normal Range: 70-105)

## 2010-10-18 NOTE — Assessment & Plan Note (Signed)
Summary: office visit   Vital Signs:  Patient profile:   77 year old female Menstrual status:  postmenopausal Height:      61 inches Weight:      125.50 pounds Resp:     16 per minute BP sitting:   130 / 70  (left arm) CC: follow up   CC:  follow up.  History of Present Illness: Pt in for review of her chronic medical problems and recent lab data. She denies recent fever or chills. She reports ongoing anxiety , which she feels may be worsening. She actually is requesting an inc in her xanax dose, but i advised against it aT THIS TIME. She reports inc post nasal drainage and nasal congestion.she denies sore throat or productive cough. the drainage is clear and is attributed to uncontrolled allergies which often accompanies changes in the weather. She reports that she is being diligent with her diet and her blood sugars are doing well.  Problems Prior to Update: 1)  Hypernatremia  (ICD-276.0) 2)  Insect Bite, Infected  (ICD-919.5) 3)  Fatigue  (ICD-780.79) 4)  Tick Bite  (ICD-E906.4) 5)  Diabetes Mellitus, Type II  (ICD-250.00) 6)  Acute Cystitis  (ICD-595.0) 7)  Hypokalemia  (ICD-276.8) 8)  Allergic Rhinitis  (ICD-477.9) 9)  Carcinoma in Situ of Breast  (ICD-233.0) 10)  Allergic Rhinitis, Seasonal  (ICD-477.0) 11)  Hyperlipidemia  (ICD-272.4) 12)  Anxiety Disorder, Generalized  (ICD-300.02) 13)  Osteoporosis Nos  (ICD-733.00) 14)  Hypertension  (ICD-401.9)  Medications Prior to Update: 1)  Metoprolol Tartrate 25 Mg  Tabs (Metoprolol Tartrate) .... Take 1 Tablet By Mouth Once A Day 2)  Klor-Con M20 20 Meq  Tbcr (Potassium Chloride Crys Cr) .... Four Tablets Alt. With Three Tablets Daily 3)  Cetirizine Hcl 10 Mg  Tabs (Cetirizine Hcl) .... Take 1 Tablet By Mouth Once A Day 4)  Oscal 500/200 D-3 500-200 Mg-Unit Tabs (Calcium-Vitamin D) .... One Tab By Mouth Tid 5)  Alprazolam 0.5 Mg Tabs (Alprazolam) .... One Tab By Mouth Qhs 6)  Multivitamins  Tabs (Multiple Vitamin) ....  Take 1 Tablet By Mouth Once A Day 7)  Tamoxifen Citrate 20 Mg Tabs (Tamoxifen Citrate) .... Take 1 Tablet By Mouth Once A Day 8)  Losartan Potassium-Hctz 100-25 Mg Tabs (Losartan Potassium-Hctz) .... Take 1 Tablet By Mouth Once A Day 9)  Amlodipine Besylate 10 Mg Tabs (Amlodipine Besylate) .... Take 1 Tablet By Mouth Once A Day 10)  Simvastatin 20 Mg Tabs (Simvastatin) .... Take 1 Tab By Mouth At Bedtime 11)  Accu Chek Strips and Lancets .... Once Daily Testing 12)  Accu-Chek Aviva  Strp (Glucose Blood) .... Once Daily Testing 13)  Accu-Chek Soft Touch Lancets  Misc (Lancets) .... Once Daily Testing 14)  Metformin Hcl 500 Mg Tabs (Metformin Hcl) .... One Half Tablet Once Daily 15)  Penicillin V Potassium 500 Mg Tabs (Penicillin V Potassium) .... Take 1 Tablet By Mouth Three Times A Day 16)  Buspirone Hcl 5 Mg Tabs (Buspirone Hcl) .... Take 1 Tablet By Mouth Two Times A Day  Allergies (verified): 1)  ! Citalopram  Review of Systems      See HPI General:  Complains of fatigue and sleep disorder. Eyes:  Complains of vision loss-both eyes; denies discharge, eye pain, and red eye; wears corrective lenses. ENT:  Complains of nasal congestion and postnasal drainage; denies hoarseness and sinus pressure. CV:  Denies chest pain or discomfort, palpitations, and swelling of feet. Resp:  Denies cough, shortness  of breath, and sputum productive. GI:  Denies abdominal pain, constipation, diarrhea, nausea, and vomiting. GU:  Denies dysuria and urinary frequency. MS:  Denies joint pain, low back pain, mid back pain, and stiffness. Derm:  Complains of itching and rash; chronic rash/itch on right upper lip. Neuro:  Denies headaches, seizures, and tingling. Psych:  Complains of anxiety; denies easily angered. Endo:  Denies excessive thirst and excessive urination. Heme:  Denies abnormal bruising and bleeding. Allergy:  Complains of seasonal allergies.  Physical Exam  General:   Well-developed,well-nourished,in no acute distress; alert,appropriate and cooperative throughout examination HEENT: No facial asymmetry,  EOMI, No sinus tenderness, TM's Clear, oropharynx  pink and moist.   Chest: Clear to auscultation bilaterally.  CVS: S1, S2, No murmurs, No S3.   Abd: Soft, Nontender.  MS: Adequate ROM spine, hips, shoulders and knees.  Ext: No edema.   CNS: CN 2-12 intact, power tone and sensation normal throughout.   Skin: Intact, no visible lesions or rashes.  Psych: Good eye contact, normal affect.  Memory intact, not anxious or depressed appearing.     Impression & Recommendations:  Problem # 1:  HYPERNATREMIA (ICD-276.0) Assessment Comment Only Pt hosp[italised today for further mx  Problem # 2:  DIABETES MELLITUS, TYPE II (ICD-250.00) Assessment: Improved  The following medications were removed from the medication list:    Metformin Hcl 500 Mg Tabs (Metformin hcl) ..... One half tablet once daily Her updated medication list for this problem includes:    Losartan Potassium-hctz 100-25 Mg Tabs (Losartan potassium-hctz) .Marland Kitchen... Take 1 tablet by mouth once a day  Labs Reviewed: Creat: 0.97 (06/27/2010)    Reviewed HgBA1c results: 6.2 (06/27/2010), pt to d/c metformin  6.5 (03/01/2010)  Problem # 3:  HYPOKALEMIA (ICD-276.8) Assessment: Deteriorated pt hospitalised for further management  Problem # 4:  CARCINOMA IN SITU OF BREAST (ICD-233.0) Assessment: Comment Only continue daily tamoxifen  Problem # 5:  ANXIETY DISORDER, GENERALIZED (ICD-300.02) Assessment: Deteriorated  The following medications were removed from the medication list:    Buspirone Hcl 5 Mg Tabs (Buspirone hcl) .Marland Kitchen... Take 1 tablet by mouth two times a day Her updated medication list for this problem includes:    Alprazolam 0.5 Mg Tabs (Alprazolam) ..... One tab by mouth qhs  Problem # 6:  HYPERLIPIDEMIA (ICD-272.4) Assessment: Improved  The following medications were removed  from the medication list:    Simvastatin 20 Mg Tabs (Simvastatin) .Marland Kitchen... Take 1 tab by mouth at bedtime  Labs Reviewed: SGOT: 24 (03/16/2010)   SGPT: 14 (03/16/2010)   HDL:79 (03/16/2010), 69 (06/03/2009)  LDL:72 (03/16/2010), 93 (06/03/2009)  Chol:162 (03/16/2010), 176 (06/03/2009)  Trig:55 (03/16/2010), 68 (06/03/2009)  Complete Medication List: 1)  Klor-con M20 20 Meq Tbcr (Potassium chloride crys cr) .... Four tablets alt. with three tablets daily 2)  Cetirizine Hcl 10 Mg Tabs (Cetirizine hcl) .... Take 1 tablet by mouth once a day 3)  Oscal 500/200 D-3 500-200 Mg-unit Tabs (Calcium-vitamin d) .... One tab by mouth tid 4)  Alprazolam 0.5 Mg Tabs (Alprazolam) .... One tab by mouth qhs 5)  Multivitamins Tabs (Multiple vitamin) .... Take 1 tablet by mouth once a day 6)  Tamoxifen Citrate 20 Mg Tabs (Tamoxifen citrate) .... Take 1 tablet by mouth once a day 7)  Losartan Potassium-hctz 100-25 Mg Tabs (Losartan potassium-hctz) .... Take 1 tablet by mouth once a day 8)  Amlodipine Besylate 10 Mg Tabs (Amlodipine besylate) .... Take 1 tablet by mouth once a day 9)  Accu Chek Strips  and Lancets  .... Once daily testing 10)  Accu-chek Aviva Strp (Glucose blood) .... Once daily testing 11)  Accu-chek Soft Touch Lancets Misc (Lancets) .... Once daily testing 12)  Metoprolol Succinate 25 Mg Xr24h-tab (Metoprolol succinate) .... Take 1 tablet by mouth once a day 13)  Citracal Plus Tabs (Multiple minerals-vitamins) .... Take 1 tablet by mouth two times a day (630/500)  Patient Instructions: 1)  Please schedule a follow-up appointment in 2 weeks. 2)  You are being admitted because your sodium is too hig and potassium too low. 3)  Pls stop metformin and simvastatin. 4)  Meds are a s listed. Prescriptions: METOPROLOL SUCCINATE 25 MG XR24H-TAB (METOPROLOL SUCCINATE) Take 1 tablet by mouth once a day  #30 x 5   Entered and Authorized by:   Tula Nakayama MD   Signed by:   Tula Nakayama MD on  06/28/2010   Method used:   Printed then faxed to ...       [address] [phone]* (retail)       [address]       [place], [place]  [address]       Ph: [phone] or [phone]       Fax: [phone]   RxID:   [id number]

## 2010-10-18 NOTE — Progress Notes (Signed)
Summary: still hurting  Phone Note Call from Patient   Summary of Call: wants you to call her at [phone] still hurting Initial call taken by: Dierdre Harness,  November 17, 2009 10:07 AM  Follow-up for Phone Call        called patient, no answer Follow-up by: Kate Sable LPN,  March  2, 624THL 11:06 AM  Additional Follow-up for Phone Call Additional follow up Details #1::        She called because she was still having pain in her back and down the left leg. She took an advil and it is feeling better so she will call back if it starts hurting again. But as of now she said it feels alot better Additional Follow-up by: Kate Sable LPN,  March  2, 624THL 2:19 PM

## 2010-10-18 NOTE — Progress Notes (Signed)
  Phone Note From Pharmacy   Caller: [place]. [phone]* Summary of Call: coverage issues. benicar and nifedipine  losartan hctz and amlodipine covered Initial call taken by: Jimmey Ralph LPN,  January 10, 624THL 9:43 AM  Follow-up for Phone Call        changes have been made, write d/c orders on thescripts pls, and fax to the pharmacy, notify pt ,and pharmacy and have her sched a nurse BP check in 5 to 6 weeks pls. The scripts have been printed, you need to write d/c Benicar/hctz and nifedipine before faxing to he rphamacy Follow-up by: Tula Nakayama MD,  September 27, 2009 10:09 AM  Additional Follow-up for Phone Call Additional follow up Details #1::        rx sent, called patient, left message Additional Follow-up by: Jimmey Ralph LPN,  January 10, 624THL 10:28 AM    Additional Follow-up for Phone Call Additional follow up Details #2::    patient aware Follow-up by: Jimmey Ralph LPN,  January 12, 624THL 1:51 PM  New/Updated Medications: LOSARTAN POTASSIUM-HCTZ 100-25 MG TABS (LOSARTAN POTASSIUM-HCTZ) Take 1 tablet by mouth once a day AMLODIPINE BESYLATE 10 MG TABS (AMLODIPINE BESYLATE) Take 1 tablet by mouth once a day Prescriptions: LOSARTAN POTASSIUM-HCTZ 100-25 MG TABS (LOSARTAN POTASSIUM-HCTZ) Take 1 tablet by mouth once a day  #30 x 3   Entered by:   Jimmey Ralph LPN   Authorized by:   Tula Nakayama MD   Signed by:   Jimmey Ralph LPN on 579FGE   Method used:   Printed then faxed to ...       [address] [phone]* (retail)       [address]       [place], [place]  [address]       Ph: [phone] or [phone]       Fax: [phone]   RxID:   [id number] AMLODIPINE BESYLATE 10 MG TABS (AMLODIPINE BESYLATE) Take 1 tablet by mouth once a day  #30 x 3   Entered and Authorized by:   Tula Nakayama MD   Signed by:   Tula Nakayama MD on 09/27/2009   Method used:   Printed then faxed to ...       [address] [phone]* (retail)  [address]       [place], [place]  [address]       Ph: [phone] or [phone]       Fax: [phone]   RxID:   [id number] LOSARTAN POTASSIUM-HCTZ 100-25 MG TABS (LOSARTAN POTASSIUM-HCTZ) Take 1 tablet by mouth once a day  #30 x 3   Entered and Authorized by:   Tula Nakayama MD   Signed by:   Tula Nakayama MD on 09/27/2009   Method used:   Electronically to        [facility]. [phone]* (retail)       [address]       [place],   [address]       Ph: [phone] or [phone]       Fax: [phone]   RxID:   [id number]

## 2010-10-18 NOTE — Progress Notes (Signed)
  Phone Note Call from Patient   Caller: Patient Summary of Call: patient states she is having the same pains she had before she was hospitalized for her potassium Initial call taken by: Baldomero Lamy LPN,  November  9, 624THL 8:27 AM  Follow-up for Phone Call        lab ordered stat per dr Vickey Ewbank Follow-up by: Baldomero Lamy LPN,  November  9, 624THL 8:28 AM

## 2010-10-18 NOTE — Progress Notes (Signed)
Summary: [facility]   Imported By: Dierdre Harness 10/26/2009 11:20:19  _____________________________________________________________________  External Attachment:    Type:   Image     Comment:   External Document

## 2010-10-18 NOTE — Letter (Signed)
Summary: chronic disease referral  chronic disease referral   Imported By: Dierdre Harness 03/09/2010 11:35:14  _____________________________________________________________________  External Attachment:    Type:   Image     Comment:   External Document

## 2010-10-18 NOTE — Miscellaneous (Signed)
Summary: Orders Update  Clinical Lists Changes  Problems: Added new problem of FATIGUE (ICD-780.79) Orders: Added new Test order of T-Hepatic Function [phone]) - Signed Added new Test order of T-Lipid Profile [phone]) - Signed Added new Test order of T-CBC w/Diff [phone]) - Signed

## 2010-10-18 NOTE — Progress Notes (Signed)
Summary: [facility]   Imported By: Dierdre Harness 11/30/2009 09:48:17  _____________________________________________________________________  External Attachment:    Type:   Image     Comment:   External Document

## 2010-10-18 NOTE — Miscellaneous (Signed)
Summary: DIABETIC SUPPLIES  Clinical Lists Changes  Medications: Added new medication of * ACCU CHEK STRIPS AND LANCETS once daily testing - Signed Rx of ACCU CHEK STRIPS AND LANCETS once daily testing;  #62month x 5;  Signed;  Entered by: Kate Sable LPN;  Authorized by: Tula Nakayama MD;  Method used: Printed then faxed to [facility]. [phone]*, [address] [place], [place], [place]  [address], Ph: [phone] or [phone], Fax: [phone]    Prescriptions: ACCU CHEK STRIPS AND LANCETS once daily testing  #53month x 5   Entered by:   Kate Sable LPN   Authorized by:   Tula Nakayama MD   Signed by:   Kate Sable LPN on [id number]   Method used:   Printed then faxed to ...       [address] [phone]* (retail)       [address]       [place], [place]  [address]       Ph: [phone] or [phone]       Fax: [phone]   RxID:   [id number]

## 2010-10-18 NOTE — Progress Notes (Signed)
[facility]   Imported By: Dierdre Harness 10/08/2009 16:27:32  _____________________________________________________________________  External Attachment:    Type:   Image     Comment:   External Document

## 2010-10-18 NOTE — Progress Notes (Signed)
Summary: lab  Phone Note Call from Patient   Summary of Call: dr. Blanch Media office would like for nurse to fax over her lab. pt called and stated this. and stated that nurse already knew to do this.  Initial call taken by: Lenn Cal,  October 07, 2009 9:41 AM  Follow-up for Phone Call        lab faxed over Follow-up by: Jimmey Ralph LPN,  January 20, 624THL 10:57 AM

## 2010-10-18 NOTE — Assessment & Plan Note (Signed)
Summary: F UP   Vital Signs:  Patient profile:   77 year old female Menstrual status:  postmenopausal Height:      61 inches Weight:      127.50 pounds BMI:     24.18 O2 Sat:      97 % Pulse rate:   70 / minute Pulse rhythm:   regular Resp:     16 per minute BP sitting:   126 / 74  (left arm) Cuff size:   regular  Vitals Entered By: Kate Sable LPN (March 30, 624THL 579FGE PM) CC: Follow up chronic problems   CC:  Follow up chronic problems.  History of Present Illness: Reports  that she is doing much better. She was recently treated for a UTI, she still has some frequency, so rept testing is deemed necessary Denies recent fever or chills. Denies sinus pressure, nasal congestion , ear pain or sore throat. Denies chest congestion, or cough productive of sputum. Denies chest pain, palpitations, PND, orthopnea or leg swelling. Denies abdominal pain, nausea, vomitting, diarrhea or constipation. Denies change in bowel movements or bloody stool. Denies dysuria , , incontinence or hesitancy. Denies  joint pain, swelling, or reduced mobility. Denies headaches, vertigo, seizures. Denies depression, anxiety or insomnia.controlled on meds. Denies  rash, lesions, or itch.     Current Medications (verified): 1)  Metoprolol Tartrate 25 Mg  Tabs (Metoprolol Tartrate) .... Take 1 Tablet By Mouth Once A Day 2)  Klor-Con M20 20 Meq  Tbcr (Potassium Chloride Crys Cr) .... Four Tablets Alt. With Three Tablets Daily 3)  Cetirizine Hcl 10 Mg  Tabs (Cetirizine Hcl) .... Take 1 Tablet By Mouth Once A Day 4)  Oscal 500/200 D-3 500-200 Mg-Unit Tabs (Calcium-Vitamin D) .... One Tab By Mouth Tid 5)  Nitrostat 0.4 Mg Subl (Nitroglycerin) .... Uad 6)  Alprazolam 0.5 Mg Tabs (Alprazolam) .... One Tab By Mouth Qhs 7)  Multivitamins  Tabs (Multiple Vitamin) .... Take 1 Tablet By Mouth Once A Day 8)  Vitamin C 500 Mg Tabs (Ascorbic Acid) .... Take 1 Tablet By Mouth Once A Day 9)  Tamoxifen Citrate 20 Mg  Tabs (Tamoxifen Citrate) .... Take 1 Tablet By Mouth Once A Day 10)  Flonase 50 Mcg/act Susp (Fluticasone Propionate) .... 2 Puffs Per Nostril Per Day As Needed 11)  Losartan Potassium-Hctz 100-25 Mg Tabs (Losartan Potassium-Hctz) .... Take 1 Tablet By Mouth Once A Day 12)  Amlodipine Besylate 10 Mg Tabs (Amlodipine Besylate) .... Take 1 Tablet By Mouth Once A Day 13)  Fosamax 70 Mg Tabs (Alendronate Sodium) .... One Tab By Mouth Q Week 14)  Simvastatin 20 Mg Tabs (Simvastatin) .... Take 1 Tab By Mouth At Bedtime  Allergies (verified): 1)  ! Citalopram  Review of Systems      See HPI Eyes:  Denies blurring, discharge, eye pain, and red eye. GU:  Complains of urinary frequency. Endo:  Denies excessive thirst and excessive urination. Heme:  Denies abnormal bruising and bleeding. Allergy:  Complains of seasonal allergies.  Physical Exam  General:  Well-developed,well-nourished,in no acute distress; alert,appropriate and cooperative throughout examination HEENT: No facial asymmetry,  EOMI, No sinus tenderness, TM's Clear, oropharynx  pink and moist.   Chest: Clear to auscultation bilaterally.  CVS: S1, S2, No murmurs, No S3.   Abd: Soft, Nontender.  MS: Adequate ROM spine, hips, shoulders and knees.  Ext: No edema.   CNS: CN 2-12 intact, power tone and sensation normal throughout.   Skin: Intact, no visible lesions  or rashes.  Psych: Good eye contact, normal affect.  Memory intact, not anxious or depressed appearing.    Impression & Recommendations:  Problem # 1:  IMPAIRED FASTING GLUCOSE (ICD-790.21) Assessment Comment Only  Orders: T- Hemoglobin A1C [id number])  Problem # 2:  ACUTE CYSTITIS (ICD-595.0) Assessment: Comment Only  The following medications were removed from the medication list:    Veetids 500 Mg Tabs (Penicillin v potassium) .Marland Kitchen... Take 1 tablet by mouth three times a day    Bactrim Ds 800-160 Mg Tabs (Sulfamethoxazole-trimethoprim) ..... One tab by mouth  two times a day  Orders: UA Dipstick W/ Micro (manual) ([id number])will hold treatment pending culture report T-Culture, Urine [id number])  Problem # 3:  HYPERTENSION (ICD-401.9) Assessment: Improved  Her updated medication list for this problem includes:    Metoprolol Tartrate 25 Mg Tabs (Metoprolol tartrate) .Marland Kitchen... Take 1 tablet by mouth once a day    Losartan Potassium-hctz 100-25 Mg Tabs (Losartan potassium-hctz) .Marland Kitchen... Take 1 tablet by mouth once a day    Amlodipine Besylate 10 Mg Tabs (Amlodipine besylate) .Marland Kitchen... Take 1 tablet by mouth once a day  BP today: 126/74 Prior BP: 152/78 (11/15/2009)  Labs Reviewed: K+: 3.4 (06/03/2009) Creat: : 0.81 (06/03/2009)   Chol: 176 (06/03/2009)   HDL: 69 (06/03/2009)   LDL: 93 (06/03/2009)   TG: 68 (06/03/2009)  Problem # 4:  HYPERLIPIDEMIA (ICD-272.4) Assessment: Comment Only  Her updated medication list for this problem includes:    Simvastatin 20 Mg Tabs (Simvastatin) .Marland Kitchen... Take 1 tab by mouth at bedtime  Labs Reviewed: SGOT: 26 (06/03/2009)   SGPT: 14 (06/03/2009)   HDL:69 (06/03/2009), 81 (02/23/2009)  LDL:93 (06/03/2009), 67 (02/23/2009)  Chol:176 (06/03/2009), 163 (02/23/2009)  Trig:68 (06/03/2009), 74 (02/23/2009) fasting labs past due  Problem # 5:  ANXIETY DISORDER, GENERALIZED (ICD-300.02) Assessment: Improved  Her updated medication list for this problem includes:    Alprazolam 0.5 Mg Tabs (Alprazolam) ..... One tab by mouth qhs  Problem # 6:  CARCINOMA IN SITU OF BREAST (ICD-233.0) Assessment: Comment Only currently on tamoxifen, pt advised to have pelvic US recommended by oncology, i explained the reason and she appeared to understand  Complete Medication List: 1)  Metoprolol Tartrate 25 Mg Tabs (Metoprolol tartrate) .... Take 1 tablet by mouth once a day 2)  Klor-con M20 20 Meq Tbcr (Potassium chloride crys cr) .... Four tablets alt. with three tablets daily 3)  Cetirizine Hcl 10 Mg Tabs (Cetirizine hcl) .... Take 1  tablet by mouth once a day 4)  Oscal 500/200 D-3 500-200 Mg-unit Tabs (Calcium-vitamin d) .... One tab by mouth tid 5)  Nitrostat 0.4 Mg Subl (Nitroglycerin) .... Uad 6)  Alprazolam 0.5 Mg Tabs (Alprazolam) .... One tab by mouth qhs 7)  Multivitamins Tabs (Multiple vitamin) .... Take 1 tablet by mouth once a day 8)  Vitamin C 500 Mg Tabs (Ascorbic acid) .... Take 1 tablet by mouth once a day 9)  Tamoxifen Citrate 20 Mg Tabs (Tamoxifen citrate) .... Take 1 tablet by mouth once a day 10)  Flonase 50 Mcg/act Susp (Fluticasone propionate) .... 2 puffs per nostril per day as needed 11)  Losartan Potassium-hctz 100-25 Mg Tabs (Losartan potassium-hctz) .... Take 1 tablet by mouth once a day 12)  Amlodipine Besylate 10 Mg Tabs (Amlodipine besylate) .... Take 1 tablet by mouth once a day 13)  Fosamax 70 Mg Tabs (Alendronate sodium) .... One tab by mouth q week 14)  Simvastatin 20 Mg Tabs (Simvastatin) .... Take 1 tab by mouth  at bedtime  Other Orders: T-Basic Metabolic Panel ([id number])  Patient Instructions: 1)  Please schedule a follow-up appointment in 4 months. 2)  Pls get the pelvic US to check the lining of your womb 3)    we qwill check your urine again today 4)  i am glad you are doing very well Prescriptions: METOPROLOL TARTRATE 25 MG  TABS (METOPROLOL TARTRATE) Take 1 tablet by mouth once a day  #60 Tablet x 4   Entered by:   Kate Sable LPN   Authorized by:   Tula Nakayama MD   Signed by:   Kate Sable LPN on 624THL   Method used:   Electronically to        [facility]. [phone]* (retail)       [address]       [place], [place]  [address]       Ph: [phone] or [phone]       Fax: [phone]   RxID:   [id number]   Laboratory Results   Urine Tests    Routine Urinalysis   Color: lt. yellow Appearance: Clear Glucose: negative   (Normal Range: Negative) Bilirubin: negative   (Normal Range: Negative) Ketone: negative   (Normal Range:  Negative) Spec. Gravity: 1.010   (Normal Range: 1.003-1.035) Blood: trace-intact   (Normal Range: Negative) pH: 7.0   (Normal Range: 5.0-8.0) Protein: negative   (Normal Range: Negative) Urobilinogen: 0.2   (Normal Range: 0-1) Nitrite: negative   (Normal Range: Negative) Leukocyte Esterace: negative   (Normal Range: Negative)

## 2010-10-18 NOTE — Progress Notes (Signed)
Summary: speak with nurse  Phone Note Call from Patient   Summary of Call: needs to speak with nurs about test strips. [id number] Initial call taken by: Lenn Cal,  March 24, 2010 10:33 AM  Follow-up for Phone Call        pls send in lancets and strips for once daily testing for her meter, dx 250.00 Follow-up by: Tula Nakayama MD,  March 24, 2010 12:22 PM  Additional Follow-up for Phone Call Additional follow up Details #1::        Phone Call Completed, Rx Called In Additional Follow-up by: Baldomero Lamy LPN,  July  7, 624THL 075-GRM PM    New/Updated Medications: ACCU-CHEK AVIVA  STRP (GLUCOSE BLOOD) once daily testing ACCU-CHEK SOFT TOUCH LANCETS  MISC (LANCETS) once daily testing Prescriptions: ACCU-CHEK SOFT TOUCH LANCETS  MISC (LANCETS) once daily testing  #100 x 2   Entered by:   Baldomero Lamy LPN   Authorized by:   Tula Nakayama MD   Signed by:   Baldomero Lamy LPN on [id number]   Method used:   Electronically to        [facility]. [phone]* (retail)       [address]       [place], [place]  [address]       Ph: [phone] or [phone]       Fax: [phone]   RxID:   [id number] [place] (GLUCOSE BLOOD) once daily testing  #100 x 2   Entered by:   Baldomero Lamy LPN   Authorized by:   Tula Nakayama MD   Signed by:   Baldomero Lamy LPN on [id number]   Method used:   Electronically to        [facility]. [phone]* (retail)       [address]       [place], [place]  [address]       Ph: [phone] or [phone]       Fax: [phone]   RxID:   [id number]

## 2010-10-18 NOTE — Assessment & Plan Note (Signed)
Summary: OV   Vital Signs:  Patient profile:   77 year old female Menstrual status:  postmenopausal Height:      61 inches Weight:      126.25 pounds BMI:     23.94 O2 Sat:      99 % on Room air Pulse rate:   72 / minute Pulse rhythm:   regular Resp:     16 per minute BP sitting:   130 / 70  (left arm)  Vitals Entered By: Baldomero Lamy LPN (August 11, 624THL 1:08 PM)  O2 Flow:  Room air CC: spider bite right leg- bottom lip also appears to be swollen Is Patient Diabetic? Yes Did you bring your meter with you today? No Pain Assessment Patient in pain? no      Comments did not bring meds to ov   CC:  spider bite right leg- bottom lip also appears to be swollen.  History of Present Illness: pt bit by a spider yesterday, she thinks it was a small brown spider. States she went to the Ed yesterday, this was cleaned with alcohol swab states the area "got tight" was swollen and red, she had excessive pain with it and used an ice pack. She has in the past been bitten by a spider. she is also concerned about slight swelling of the lower lip She states she is taking the metformin regularly, but has not been testing regularly and states thast the strips cost over $100 Reports short term memory loss. she denies any recent fever or chills. She denies head or chest congestion. She denies significant joint pain or reduced mobility.  Allergies (verified): 1)  ! Citalopram  Review of Systems      See HPI Eyes:  Complains of vision loss-both eyes. CV:  Denies chest pain or discomfort, difficulty breathing at night, difficulty breathing while lying down, palpitations, and swelling of feet. GI:  Denies abdominal pain, constipation, diarrhea, nausea, and vomiting. GU:  Complains of dysuria and urinary frequency; 3 day history. Neuro:  Complains of memory loss. Psych:  Complains of anxiety; denies depression. Endo:  Denies excessive thirst and excessive urination. Heme:  Denies abnormal  bruising and bleeding. Allergy:  Complains of seasonal allergies.  Physical Exam  General:  Well-developed,well-nourished,in no acute distress; alert,appropriate and cooperative throughout examination HEENT: No facial asymmetry,  EOMI, No sinus tenderness, TM's Clear, oropharynx  pink and moist.   Chest: Clear to auscultation bilaterally.  CVS: S1, S2, No murmurs, No S3.   Abd: Soft, Nontender.  MS: Adequate ROM spine, hips, shoulders and knees.  Ext: No edema.   CNS: CN 2-12 intact, power tone and sensation normal throughout.   Skin: Intact,erythematous mildly tender area on right leg Psych: Good eye contact, normal affect.  Memory intact, not anxious or depressed appearing.    Impression & Recommendations:  Problem # 1:  DIABETES MELLITUS, TYPE II (ICD-250.00) Assessment Comment Only  Her updated medication list for this problem includes:    Losartan Potassium-hctz 100-25 Mg Tabs (Losartan potassium-hctz) .Marland Kitchen... Take 1 tablet by mouth once a day    Metformin Hcl 500 Mg Tabs (Metformin hcl) ..... One half tablet once daily  Orders: T- Hemoglobin A1C [id number]) T-Urine Microalbumin w/creat. ratio [phone])  Labs Reviewed: Creat: 0.87 (03/01/2010)    Reviewed HgBA1c results: 6.5 (03/01/2010)  Problem # 2:  ANXIETY DISORDER, GENERALIZED (ICD-300.02) Assessment: Improved  Her updated medication list for this problem includes:    Alprazolam 0.5 Mg Tabs (Alprazolam) ..... One  tab by mouth qhs    Buspirone Hcl 5 Mg Tabs (Buspirone hcl) .Marland Kitchen... Take 1 tablet by mouth two times a day  Problem # 3:  HYPERTENSION (ICD-401.9) Assessment: Unchanged  Her updated medication list for this problem includes:    Metoprolol Tartrate 25 Mg Tabs (Metoprolol tartrate) .Marland Kitchen... Take 1 tablet by mouth once a day    Losartan Potassium-hctz 100-25 Mg Tabs (Losartan potassium-hctz) .Marland Kitchen... Take 1 tablet by mouth once a day    Amlodipine Besylate 10 Mg Tabs (Amlodipine besylate) .Marland Kitchen... Take 1  tablet by mouth once a day  Orders: T-Basic Metabolic Panel (99991111) Urinalysis (81003-65000)  BP today: 130/70 Prior BP: 120/80 (03/28/2010)  Labs Reviewed: K+: 3.8 (03/01/2010) Creat: : 0.87 (03/01/2010)   Chol: 162 (03/16/2010)   HDL: 79 (03/16/2010)   LDL: 72 (03/16/2010)   TG: 55 (03/16/2010)  Problem # 4:  ALLERGY TO INSECTS AND ARACHNIDS (ICD-V15.06) Assessment: Deteriorated antibiotic prescribed  Problem # 5:  ACUTE CYSTITIS (ICD-595.0) Assessment: Comment Only  The following medications were removed from the medication list:    Ciprofloxacin Hcl 500 Mg Tabs (Ciprofloxacin hcl) .Marland Kitchen... Take 1 tablet by mouth two times a day Her updated medication list for this problem includes:    Penicillin V Potassium 500 Mg Tabs (Penicillin v potassium) .Marland Kitchen... Take 1 tablet by mouth three times a day  Orders: T-Culture, Urine [id number])  Encouraged to push clear liquids, get enough rest, and take acetaminophen as needed. To be seen in 10 days if no improvement, sooner if worse.  Complete Medication List: 1)  Metoprolol Tartrate 25 Mg Tabs (Metoprolol tartrate) .... Take 1 tablet by mouth once a day 2)  Klor-con M20 20 Meq Tbcr (Potassium chloride crys cr) .... Four tablets alt. with three tablets daily 3)  Cetirizine Hcl 10 Mg Tabs (Cetirizine hcl) .... Take 1 tablet by mouth once a day 4)  Oscal 500/200 D-3 500-200 Mg-unit Tabs (Calcium-vitamin d) .... One tab by mouth tid 5)  Alprazolam 0.5 Mg Tabs (Alprazolam) .... One tab by mouth qhs 6)  Multivitamins Tabs (Multiple vitamin) .... Take 1 tablet by mouth once a day 7)  Tamoxifen Citrate 20 Mg Tabs (Tamoxifen citrate) .... Take 1 tablet by mouth once a day 8)  Losartan Potassium-hctz 100-25 Mg Tabs (Losartan potassium-hctz) .... Take 1 tablet by mouth once a day 9)  Amlodipine Besylate 10 Mg Tabs (Amlodipine besylate) .... Take 1 tablet by mouth once a day 10)  Simvastatin 20 Mg Tabs (Simvastatin) .... Take 1 tab by mouth at  bedtime 11)  Accu Chek Strips and Lancets  .... Once daily testing 12)  Accu-chek Aviva Strp (Glucose blood) .... Once daily testing 13)  Accu-chek Soft Touch Lancets Misc (Lancets) .... Once daily testing 14)  Metformin Hcl 500 Mg Tabs (Metformin hcl) .... One half tablet once daily 15)  Penicillin V Potassium 500 Mg Tabs (Penicillin v potassium) .... Take 1 tablet by mouth three times a day 16)  Buspirone Hcl 5 Mg Tabs (Buspirone hcl) .... Take 1 tablet by mouth two times a day  Patient Instructions: 1)  F/U Sept 18 or after. 2)  Med will be sent in for your spider bite, also a new med will be sent in for anxiety , you need to wean  off the xanax start half at night for 1 week, then half Mon , Wed and Friday the following week, then stop if possible.  3)  BMP prior to visit, ICD-9:   Sept 16 or  after 4)  HbgA1C prior to visit, ICD-9: Prescriptions: BUSPIRONE HCL 5 MG TABS (BUSPIRONE HCL) Take 1 tablet by mouth two times a day  #60 x 2   Entered and Authorized by:   Tula Nakayama MD   Signed by:   Tula Nakayama MD on 04/28/2010   Method used:   Electronically to        [facility]. [phone]* (retail)       [address]       [place], [place]  [address]       Ph: [phone] or [phone]       Fax: [phone]   RxID:   [id number] PENICILLIN V POTASSIUM 500 MG TABS (PENICILLIN V POTASSIUM) Take 1 tablet by mouth three times a day  #21 x 0   Entered and Authorized by:   Tula Nakayama MD   Signed by:   Tula Nakayama MD on 04/28/2010   Method used:   Electronically to        [facility]. [phone]* (retail)       [address]       [place], [place]  [address]       Ph: [phone] or [phone]       Fax: [phone]   RxID:   [id number]   Laboratory Results   Urine Tests  Date/Time Received: April 28, 2010 1:55 PM  Date/Time Reported: April 28, 2010 1:55 PM   Routine Urinalysis   Color: yellow Appearance:  Clear Glucose: negative   (Normal Range: Negative) Bilirubin: negative   (Normal Range: Negative) Ketone: negative   (Normal Range: Negative) Spec. Gravity: 1.015   (Normal Range: 1.003-1.035) Blood: trace-intact   (Normal Range: Negative) pH: 7.5   (Normal Range: 5.0-8.0) Protein: negative   (Normal Range: Negative) Urobilinogen: 0.2   (Normal Range: 0-1) Nitrite: negative   (Normal Range: Negative) Leukocyte Esterace: negative   (Normal Range: Negative)

## 2010-10-18 NOTE — Progress Notes (Signed)
Summary: [facility]   Imported By: Dierdre Harness 11/25/2009 13:56:01  _____________________________________________________________________  External Attachment:    Type:   Image     Comment:   External Document

## 2010-10-18 NOTE — Progress Notes (Signed)
Summary: bite  Phone Note Call from Patient   Summary of Call: pt called in and said she got bitten by something. Stated leg was swollen and had ice on it. offered appt with Dawn but she wouldn't take it.  so  i made her one for doc in the morning at 8:15. Told her if it got worse to go to ED. pt stated she would. but also wanted to document in case nurse wanted to call her back. [phone] Initial call taken by: Lenn Cal,  April 27, 2010 2:57 PM  Follow-up for Phone Call        noted Follow-up by: Tula Nakayama MD,  April 28, 2010 8:08 AM

## 2010-10-18 NOTE — Progress Notes (Signed)
  Phone Note Call from Patient   Summary of Call: Patient states that over the weekend she started having pain in her back and she has been running a temperature. Having pains across her whole back that goes down her legs, sometimes unbearable, chills for 3 days. Wanted to come in to see doctor to get some shots for the pain. Luann scheduled her for 4:15 today Initial call taken by: Kate Sable LPN,  February 28, 624THL 9:15 AM

## 2010-10-18 NOTE — Assessment & Plan Note (Signed)
Summary: back pain- room 1   Vital Signs:  Patient profile:   77 year old female Menstrual status:  postmenopausal Height:      61 inches Weight:      127.25 pounds BMI:     24.13 O2 Sat:      98 % on Room air Pulse rate:   76 / minute Resp:     16 per minute BP sitting:   152 / 78  (left arm)  Vitals Entered By: Baldomero Lamy LPN (February 28, 624THL 4:00 PM) CC: back pain down left leg- with activity Is Patient Diabetic? No Pain Assessment      Location: back   CC:  back pain down left leg- with activity.  History of Present Illness: Pt c/o pain in her back x about 4-5 days. Has moved around & varied in location, but today is in the lower back.  She has had some aching in her hips with this off & on too.  Also has had some chills with it.  No change in urination.  Pt states she always has incontinence.  Pt is requesting shots to help with the pain she is having.  The pain is overall better today than it was over the wkend.  She denies any injury or aggrevating factor.   Current Medications (verified): 1)  Metoprolol Tartrate 25 Mg  Tabs (Metoprolol Tartrate) .... Take 1 Tablet By Mouth Once A Day 2)  Klor-Con M20 20 Meq  Tbcr (Potassium Chloride Crys Cr) .... Four Tablets Alt. With Three Tablets Daily 3)  Cetirizine Hcl 10 Mg  Tabs (Cetirizine Hcl) .... Take 1 Tablet By Mouth Once A Day 4)  Oscal 500/200 D-3 500-200 Mg-Unit Tabs (Calcium-Vitamin D) .... One Tab By Mouth Tid 5)  Nitrostat 0.4 Mg Subl (Nitroglycerin) .... Uad 6)  Alprazolam 0.5 Mg Tabs (Alprazolam) .... One Tab By Mouth Qhs 7)  Multivitamins  Tabs (Multiple Vitamin) .... Take 1 Tablet By Mouth Once A Day 8)  Vitamin C 500 Mg Tabs (Ascorbic Acid) .... Take 1 Tablet By Mouth Once A Day 9)  Tamoxifen Citrate 20 Mg Tabs (Tamoxifen Citrate) .... Take 1 Tablet By Mouth Once A Day 10)  Flonase 50 Mcg/act Susp (Fluticasone Propionate) .... 2 Puffs Per Nostril Per Day As Needed 11)  Veetids 500 Mg Tabs (Penicillin V  Potassium) .... Take 1 Tablet By Mouth Three Times A Day 12)  Losartan Potassium-Hctz 100-25 Mg Tabs (Losartan Potassium-Hctz) .... Take 1 Tablet By Mouth Once A Day 13)  Amlodipine Besylate 10 Mg Tabs (Amlodipine Besylate) .... Take 1 Tablet By Mouth Once A Day 14)  Fosamax 70 Mg Tabs (Alendronate Sodium) .... One Tab By Mouth Q Week  Allergies (verified): 1)  ! Citalopram  Past History:  Past medical history reviewed for relevance to current acute and chronic problems.  Past Medical History: Reviewed history from 05/20/2008 and no changes required. ALLERGIC RHINITIS, SEASONAL (ICD-477.0) HYPERLIPIDEMIA (ICD-272.4) ANXIETY DISORDER, GENERALIZED (ICD-300.02) OSTEOPOROSIS NOS (ICD-733.00) HYPERTENSION (ICD-401.9) Left breast cancer 2009  Review of Systems General:  Complains of chills and fatigue; denies fever. CV:  Denies chest pain or discomfort. Resp:  Denies cough and shortness of breath. GI:  Denies change in bowel habits, diarrhea, nausea, and vomiting. GU:  Denies dysuria, hematuria, and urinary frequency. MS:  Complains of low back pain, mid back pain, and muscle aches; denies muscle weakness and stiffness. Derm:  Denies rash. Neuro:  Denies numbness and tingling.  Physical Exam  General:  Well-developed,well-nourished,in  no acute distress; alert,appropriate and cooperative throughout examination Head:  Normocephalic and atraumatic without obvious abnormalities. No apparent alopecia or balding. Ears:  External ear exam shows no significant lesions or deformities.  Otoscopic examination reveals clear canals, tympanic membranes are intact bilaterally without bulging, retraction, inflammation or discharge. Hearing is grossly normal bilaterally. Nose:  External nasal examination shows no deformity or inflammation. Nasal mucosa are pink and moist without lesions or exudates. Mouth:  Oral mucosa and oropharynx without lesions or exudates.  Teeth in good repair. Neck:  No  deformities, masses, or tenderness noted. Lungs:  Normal respiratory effort, chest expands symmetrically. Lungs are clear to auscultation, no crackles or wheezes. Heart:  Normal rate and regular rhythm. S1 and S2 normal without gallop, murmur, click, rub or other extra sounds. Abdomen:  Bowel sounds positive,abdomen soft and non-tender without masses, organomegaly or hernias noted. Msk:  normal ROM.  TTP LS spine in the L4-5 area.  Nontender SI joints & sciatic notch.  Nl strength bilat LE. Extremities:  No clubbing, cyanosis, edema, or deformity noted with normal full range of motion of all joints.   Neurologic:  alert & oriented X3, strength normal in all extremities, gait normal, and DTRs symmetrical and normal.     Impression & Recommendations:  Problem # 1:  LOW BACK PAIN, ACUTE (ICD-724.2) Assessment New Discussed may try heat to low back to help with discomfort.  Orders: Depo- Medrol 80mg  (J1040) Ketorolac-Toradol 15mg  [id number]) Admin of Therapeutic Inj  intramuscular or subcutaneous [id number])  Problem # 2:  HYPERTENSION (ICD-401.9)  Her updated medication list for this problem includes:    Metoprolol Tartrate 25 Mg Tabs (Metoprolol tartrate) .Marland Kitchen... Take 1 tablet by mouth once a day    Losartan Potassium-hctz 100-25 Mg Tabs (Losartan potassium-hctz) .Marland Kitchen... Take 1 tablet by mouth once a day    Amlodipine Besylate 10 Mg Tabs (Amlodipine besylate) .Marland Kitchen... Take 1 tablet by mouth once a day  BP today: 152/78 Prior BP: 130/62 (08/05/2009)  Labs Reviewed: K+: 3.4 (06/03/2009) Creat: : 0.81 (06/03/2009)   Chol: 176 (06/03/2009)   HDL: 69 (06/03/2009)   LDL: 93 (06/03/2009)   TG: 68 (06/03/2009)  Problem # 3:  URINARY INCONTINENCE (ICD-788.30) Assessment: Unchanged  Complete Medication List: 1)  Metoprolol Tartrate 25 Mg Tabs (Metoprolol tartrate) .... Take 1 tablet by mouth once a day 2)  Klor-con M20 20 Meq Tbcr (Potassium chloride crys cr) .... Four tablets alt. with three tablets  daily 3)  Cetirizine Hcl 10 Mg Tabs (Cetirizine hcl) .... Take 1 tablet by mouth once a day 4)  Oscal 500/200 D-3 500-200 Mg-unit Tabs (Calcium-vitamin d) .... One tab by mouth tid 5)  Nitrostat 0.4 Mg Subl (Nitroglycerin) .... Uad 6)  Alprazolam 0.5 Mg Tabs (Alprazolam) .... One tab by mouth qhs 7)  Multivitamins Tabs (Multiple vitamin) .... Take 1 tablet by mouth once a day 8)  Vitamin C 500 Mg Tabs (Ascorbic acid) .... Take 1 tablet by mouth once a day 9)  Tamoxifen Citrate 20 Mg Tabs (Tamoxifen citrate) .... Take 1 tablet by mouth once a day 10)  Flonase 50 Mcg/act Susp (Fluticasone propionate) .... 2 puffs per nostril per day as needed 11)  Veetids 500 Mg Tabs (Penicillin v potassium) .... Take 1 tablet by mouth three times a day 12)  Losartan Potassium-hctz 100-25 Mg Tabs (Losartan potassium-hctz) .... Take 1 tablet by mouth once a day 13)  Amlodipine Besylate 10 Mg Tabs (Amlodipine besylate) .... Take 1 tablet by mouth once a  day 14)  Fosamax 70 Mg Tabs (Alendronate sodium) .... One tab by mouth q week  Other Orders: UA Dipstick W/ Micro (manual) (81000) T-Culture, Urine [id number])  Patient Instructions: 1)  Please schedule a follow-up appointment as needed. 2)  Take 400-600mg  of Ibuprofen (Advil, Motrin) with food every 4-6 hours as needed for relief of pain or comfort. 3)  May try heat to low back as needed for pain.  Laboratory Results   Urine Tests  Date/Time Received: November 15, 2009  Date/Time Reported: November 15, 2009   Routine Urinalysis   Color: yellow Appearance: Clear Glucose: negative   (Normal Range: Negative) Bilirubin: negative   (Normal Range: Negative) Ketone: negative   (Normal Range: Negative) Spec. Gravity: 1.010   (Normal Range: 1.003-1.035) Blood: negative   (Normal Range: Negative) pH: 7.0   (Normal Range: 5.0-8.0) Protein: 100   (Normal Range: Negative) Urobilinogen: 0.2   (Normal Range: 0-1) Nitrite: negative   (Normal Range:  Negative) Leukocyte Esterace: small   (Normal Range: Negative)        Medication Administration  Injection # 1:    Medication: Depo- Medrol 80mg     Diagnosis: LOW BACK PAIN, ACUTE (ICD-724.2)    Route: IM    Site: RUOQ gluteus    Exp Date: 11/11    Lot #: [id number]    Mfr: Pharmacia    Patient tolerated injection without complications    Given by: Baldomero Lamy LPN (February 28, 624THL 4:47 PM)  Injection # 2:    Medication: Ketorolac-Toradol 15mg     Diagnosis: LOW BACK PAIN, ACUTE (ICD-724.2)    Route: IM    Site: LUOQ gluteus    Exp Date: 04/19/2011    Lot #: [id number]    Mfr: [email or web address]    Comments: toradol 60mg  given    Patient tolerated injection without complications    Given by: Baldomero Lamy LPN (February 28, 624THL 4:48 PM)  Orders Added: 1)  UA Dipstick W/ Micro (manual) [81000] 2)  T-Culture, Urine [id number] 3)  Depo- Medrol 80mg  [J1040] 4)  Ketorolac-Toradol 15mg  [J1885] 5)  Admin of Therapeutic Inj  intramuscular or subcutaneous [[id number]] 6)  Est. Patient Level IV [id number]

## 2010-10-18 NOTE — Progress Notes (Signed)
Summary: [facility]   Imported By: Dierdre Harness 05/09/2010 14:13:55  _____________________________________________________________________  External Attachment:    Type:   Image     Comment:   External Document

## 2010-10-18 NOTE — Progress Notes (Signed)
Summary: PHARMACY  Phone Note Call from Patient   Summary of Call: [facility] PHARMICIST LEFT MESSAGE  SHE IS CONFUSED ABOUT HER MEDICINES  CALL BACK AT [phone] Initial call taken by: Dierdre Harness,  July 07, 2010 4:57 PM  Follow-up for Phone Call        she needs to taske the meds as per her hosp d/c summary, may need to come by this [pm for someone to review them with her Follow-up by: Tula Nakayama MD,  July 08, 2010 12:40 PM  Additional Follow-up for Phone Call Additional follow up Details #1::        Emily Jennings just had follow up visit this week  Additional Follow-up by: Baldomero Lamy LPN,  October 25, 624THL 9:41 AM

## 2010-10-18 NOTE — Assessment & Plan Note (Signed)
Summary: hospital follow up   Vital Signs:  Patient profile:   77 year old female Menstrual status:  postmenopausal Height:      61 inches Weight:      125.75 pounds BMI:     23.85 O2 Sat:      99 % on Room air Pulse rate:   76 / minute Resp:     16 per minute BP sitting:   148 / 66  (left arm)  Vitals Entered By: Louie Casa CMA (July 11, 2010 9:47 AM)  O2 Flow:  Room air CC: follow up   CC:  follow up.  History of Present Illness: pt recently hospitalised with hyponatremiua, med adjustments were made which to date sghe has not followed since her d/c unfortunately. She feels well and has an appt with her nephrologist in the next 3 days.  Denies recent fever or chills. Denies sinus pressure, nasal congestion , ear pain or sore throat. Denies chest congestion, or cough productive of sputum. Denies chest pain, palpitations, PND, orthopnea or leg swelling. Denies abdominal pain, nausea, vomitting, diarrhea or constipation. Denies change in bowel movements or bloody stool. Denies dysuria , frequency, incontinence or hesitancy. Denies  joint pain, swelling, or reduced mobility. Denies headaches, vertigo, seizures. Denies depression, anxiety or insomnia. Denies  rash, lesions, or itch.     Current Medications (verified): 1)  Klor-Con M20 20 Meq  Tbcr (Potassium Chloride Crys Cr) .... Four Tablets Alt. With Three Tablets Daily 2)  Cetirizine Hcl 10 Mg  Tabs (Cetirizine Hcl) .... Take 1 Tablet By Mouth Once A Day 3)  Alprazolam 0.5 Mg Tabs (Alprazolam) .... One Tab By Mouth At Bedtime. 4)  Multivitamins  Tabs (Multiple Vitamin) .... Take 1 Tablet By Mouth Once A Day 5)  Tamoxifen Citrate 20 Mg Tabs (Tamoxifen Citrate) .... Take 1 Tablet By Mouth Once A Day 6)  Losartan Potassium-Hctz 100-25 Mg Tabs (Losartan Potassium-Hctz) .... Take 1 Tablet By Mouth Once A Day 7)  Amlodipine Besylate 10 Mg Tabs (Amlodipine Besylate) .... Take 1 Tablet By Mouth Once A Day 8)  Accu Chek  Strips and Lancets .... Once Daily Testing 9)  Accu-Chek Aviva  Strp (Glucose Blood) .... Once Daily Testing 10)  Accu-Chek Soft Touch Lancets  Misc (Lancets) .... Once Daily Testing 11)  Metoprolol Succinate 25 Mg Xr24h-Tab (Metoprolol Succinate) .... Take 1 Tablet By Mouth Once A Day 12)  Citracal Plus  Tabs (Multiple Minerals-Vitamins) .... Take 1 Tablet By Mouth Two Times A Day (630/500)  Allergies (verified): 1)  ! Citalopram  Review of Systems      See HPI Eyes:  Complains of vision loss-both eyes. Endo:  Denies cold intolerance, excessive thirst, and excessive urination. Heme:  Denies abnormal bruising and bleeding. Allergy:  Complains of seasonal allergies; denies hives or rash and itching eyes.  Physical Exam  General:  Well-developed,well-nourished,in no acute distress; alert,appropriate and cooperative throughout examination HEENT: No facial asymmetry,  EOMI, No sinus tenderness, TM's Clear, oropharynx  pink and moist.   Chest: Clear to auscultation bilaterally.  CVS: S1, S2, No murmurs, No S3.   Abd: Soft, Nontender.  Rectal: no mass, stool is guaic negative, no palpable mass MS: Adequate ROM spine, hips, shoulders and knees.  Ext: No edema.   CNS: CN 2-12 intact, power tone and sensation normal throughout.   Skin: Intact, no visible lesions or rashes.  Psych: Good eye contact, normal affect.  Memory intact, not anxious or depressed appearing.    Impression &  Recommendations:  Problem # 1:  HYPERNATREMIA (ICD-276.0) Assessment Improved  Problem # 2:  HYPOKALEMIA (ICD-276.8) Assessment: Improved  Orders: Medicare Electronic Prescription [phone])  Problem # 3:  ALLERGIC RHINITIS (ICD-477.9) Assessment: Unchanged  The following medications were removed from the medication list:    Cetirizine Hcl 10 Mg Tabs (Cetirizine hcl) .Marland Kitchen... Take 1 tablet by mouth once a day Her updated medication list for this problem includes:    Zyrtec Allergy 10 Mg Tbdp (Cetirizine  hcl) .Marland Kitchen... Take 1 tablet by mouth once a day as needed for uncontrolled allergies  Problem # 4:  ANXIETY DISORDER, GENERALIZED (ICD-300.02) Assessment: Improved  Her updated medication list for this problem includes:    Alprazolam 0.5 Mg Tabs (Alprazolam) ..... One tab by mouth at bedtime.  Problem # 5:  HYPERTENSION (ICD-401.9) Assessment: Deteriorated  The following medications were removed from the medication list:    Losartan Potassium-hctz 100-25 Mg Tabs (Losartan potassium-hctz) .Marland Kitchen... Take 1 tablet by mouth once a day Her updated medication list for this problem includes:    Amlodipine Besylate 10 Mg Tabs (Amlodipine besylate) .Marland Kitchen... Take 1 tablet by mouth once a day    Metoprolol Succinate 25 Mg Xr24h-tab (Metoprolol succinate) .Marland Kitchen... Take 1 tablet by mouth once a day    Losartan Potassium 100 Mg Tabs (Losartan potassium) .Marland Kitchen... Take 1 tablet by mouth once a day    Amiloride Hcl 5 Mg Tabs (Amiloride hcl) .Marland Kitchen..Marland Kitchen Two tablets once daily I excplained the impt of following the new med list to prevent electrolyte disturbance, she understands BP today: 148/66 Prior BP: 130/70 (06/28/2010)  Labs Reviewed: K+: 2.8 (06/27/2010) Creat: : 0.97 (06/27/2010)   Chol: TNP mg/dL (06/28/2010)   HDL: TNP mg/dL (06/28/2010)   LDL: NOT CALC mg/dL (06/28/2010)   TG: TNP mg/dL (06/28/2010)  Problem # 6:  Preventive Health Care (ICD-V70.0) Assessment: Comment Only rectal exam done , stool is guaic neg and no mmass palpated. Annual screen was past due  Complete Medication List: 1)  Alprazolam 0.5 Mg Tabs (Alprazolam) .... One tab by mouth at bedtime. 2)  Multivitamins Tabs (Multiple vitamin) .... Take 1 tablet by mouth once a day 3)  Tamoxifen Citrate 20 Mg Tabs (Tamoxifen citrate) .... Take 1 tablet by mouth once a day 4)  Amlodipine Besylate 10 Mg Tabs (Amlodipine besylate) .... Take 1 tablet by mouth once a day 5)  Accu Chek Strips and Lancets  .... Once daily testing 6)  Accu-chek Aviva Strp  (Glucose blood) .... Once daily testing 7)  Accu-chek Soft Touch Lancets Misc (Lancets) .... Once daily testing 8)  Metoprolol Succinate 25 Mg Xr24h-tab (Metoprolol succinate) .... Take 1 tablet by mouth once a day 9)  Losartan Potassium 100 Mg Tabs (Losartan potassium) .... Take 1 tablet by mouth once a day 10)  Amiloride Hcl 5 Mg Tabs (Amiloride hcl) .... Two tablets once daily 11)  Zyrtec Allergy 10 Mg Tbdp (Cetirizine hcl) .... Take 1 tablet by mouth once a day as needed for uncontrolled allergies 12)  Citracal Plus Tabs (Multiple minerals-vitamins) .... Take 1 tablet by mouth two times a day (630/500) 13)  Potassium Chloride Crys Cr 20 Meq Cr-tabs (Potassium chloride crys cr) .... Take 1 tablet by mouth once a day  Other Orders: T-Basic Metabolic Panel ([id number]) T- Hemoglobin A1C [phone]) Hemoccult Guaiac-1 spec.(in office) [phone])  Patient Instructions: 1)  f/u   end January, call if you need me before 2)  pls keep all appt with the nephrologist 3)  PLS sTOP  losartan/hctz 4)  pLS  fill the 2 new blood pressure pills and start  5)  losartan tomorrow, but the amiloride needs to be taken today. 6)  the dose of potassium is REDUCED to one daily 7)  I am glad that you are better, rectaI today 8)  hBA1c and chem7 non fasting in january Prescriptions: POTASSIUM CHLORIDE CRYS CR 20 MEQ CR-TABS (POTASSIUM CHLORIDE CRYS CR) Take 1 tablet by mouth once a day  #30 x 3   Entered and Authorized by:   Tula Nakayama MD   Signed by:   Tula Nakayama MD on 07/11/2010   Method used:   Printed then faxed to ...       [address] [phone]* (retail)       [address]       [place], [place]  [address]       Ph: [phone] or [phone]       Fax: [phone]   RxID:   [id number] AMILORIDE HCL 5 MG TABS (AMILORIDE HCL) two tablets once daily  #60 x 3   Entered and Authorized by:   Tula Nakayama MD   Signed by:   Tula Nakayama MD on 07/11/2010    Method used:   Printed then faxed to ...       [address] [phone]* (retail)       [address]       [place], [place]  [address]       Ph: [phone] or [phone]       Fax: [phone]   RxID:   [id number] LOSARTAN POTASSIUM 100 MG TABS (LOSARTAN POTASSIUM) Take 1 tablet by mouth once a day  #30 x 3   Entered and Authorized by:   Tula Nakayama MD   Signed by:   Tula Nakayama MD on 07/11/2010   Method used:   Printed then faxed to ...       [address] [phone]* (retail)       [address]       [place],   [address]       Ph: [phone] or [phone]       Fax: [phone]   RxID:   [id number]    Orders Added: 1)  Est. Patient Level IV [id number] 2)  Medicare Electronic Prescription [id number] 3)  T-Basic Metabolic Panel 0000000 4)  T- Hemoglobin A1C [83036-23375] 5)  Hemoccult Guaiac-1 spec.(in office) [id number]    Laboratory Results  Date/Time Received: July 11, 2010 12:00 PM  Date/Time Reported: July 11, 2010 12:00 PM   Stool - Occult Blood Hemmoccult #1: negative Date: 07/11/2010 Comments: 5030 5/14 50590 1L 03/12 Baldomero Lamy LPN  October 24, 624THL 12:01 PM

## 2010-10-18 NOTE — Progress Notes (Signed)
Summary: OBGYN  OBGYN   Imported By: Dierdre Harness 12/22/2009 10:41:24  _____________________________________________________________________  External Attachment:    Type:   Image     Comment:   External Document

## 2010-10-18 NOTE — Progress Notes (Signed)
Summary: tick bite  Phone Note Call from Patient   Summary of Call: had two tick bites and it itching. can she get some more meds for this. [id number] Initial call taken by: Lenn Cal,  Feb 04, 2010 9:48 AM  Follow-up for Phone Call        if just itching use tc benadryyl, if appears red and is sore as if infected pls erx doxycycline 100mg  one twice daily #14 Follow-up by: Tula Nakayama MD,  Feb 04, 2010 12:35 PM  Additional Follow-up for Phone Call Additional follow up Details #1::        called patient, left message Additional Follow-up by: Kate Sable LPN,  May 20, 624THL [id number] PM    Additional Follow-up for Phone Call Additional follow up Details #2::    returned call, left message Follow-up by: Baldomero Lamy LPN,  May 20, 624THL [id number] PM  Additional Follow-up for Phone Call Additional follow up Details #3:: Details for Additional Follow-up Action Taken: Patient states it just itches. I advised her she didn't need an antibiotic unless the area became red or infected looking. She said Benadryl doesn't agree with her so I told her to try some hydrocortisone cream and call back if  she needed the antibiotic Additional Follow-up by: Kate Sable LPN,  May 20, 624THL 624THL PM

## 2010-10-18 NOTE — Progress Notes (Signed)
Summary: rx for allegry  Phone Note Call from Patient   Summary of Call: pt states she has allegies really bad and taking allgrey meds,. but not helping. will doc call her in something. [id number] Initial call taken by: Lenn Cal,  December 31, 2009 8:25 AM  Follow-up for Phone Call        pls advise her I sent ina pred dose pack Follow-up by: Tula Nakayama MD,  December 31, 2009 12:22 PM  Additional Follow-up for Phone Call Additional follow up Details #1::        returned call, no answer Additional Follow-up by: Baldomero Lamy LPN,  April 15, 624THL 1:19 PM    New/Updated Medications: PREDNISONE (PAK) 5 MG TABS (PREDNISONE) Use as directed Prescriptions: PREDNISONE (PAK) 5 MG TABS (PREDNISONE) Use as directed  #21 x 0   Entered and Authorized by:   Tula Nakayama MD   Signed by:   Tula Nakayama MD on 12/31/2009   Method used:   Electronically to        [facility]. [phone]* (retail)       [address]       [place], [place]  [address]       Ph: [phone] or [phone]       Fax: [phone]   RxID:   [id number]

## 2010-10-18 NOTE — Letter (Signed)
Summary: med review sheet  med review sheet   Imported By: Lenn Cal 06/28/2010 15:49:20  _____________________________________________________________________  External Attachment:    Type:   Image     Comment:   External Document

## 2010-10-20 NOTE — Progress Notes (Signed)
Summary: nurse  Phone Note Call from Patient   Summary of Call: pt has been real fatigue and feels like potissum may be lowered. needs to take two aday.  [id number] Initial call taken by: Lenn Cal,  October 11, 2010 4:52 PM  Follow-up for Phone Call        let pt get chem 7 checked as a stat tomorrow morning pls Follow-up by: Tula Nakayama MD,  October 11, 2010 5:00 PM  Additional Follow-up for Phone Call Additional follow up Details #1::        patient aware Additional Follow-up by: Baldomero Lamy LPN,  January 24, [id number] 5:04 PM

## 2010-11-02 ENCOUNTER — Encounter: Payer: Self-pay | Admitting: Family Medicine

## 2010-11-15 NOTE — Letter (Signed)
[facility]: [facility]   Imported By: Dierdre Harness 11/09/2010 13:52:38  _____________________________________________________________________  External Attachment:    Type:   Image     Comment:   External Document

## 2010-11-30 LAB — BASIC METABOLIC PANEL
BUN: 10 mg/dL (ref 6–23)
BUN: 11 mg/dL (ref 6–23)
BUN: 13 mg/dL (ref 6–23)
BUN: 15 mg/dL (ref 6–23)
CO2: 29 mEq/L (ref 19–32)
CO2: 31 mEq/L (ref 19–32)
CO2: 31 mEq/L (ref 19–32)
Calcium: 8.5 mg/dL (ref 8.4–10.5)
Calcium: 8.6 mg/dL (ref 8.4–10.5)
Calcium: 8.7 mg/dL (ref 8.4–10.5)
Calcium: 9.1 mg/dL (ref 8.4–10.5)
Calcium: 9.1 mg/dL (ref 8.4–10.5)
Chloride: 105 mEq/L (ref 96–112)
Chloride: 110 mEq/L (ref 96–112)
Creatinine, Ser: 0.7 mg/dL (ref 0.4–1.2)
Creatinine, Ser: 0.7 mg/dL (ref 0.4–1.2)
Creatinine, Ser: 0.71 mg/dL (ref 0.4–1.2)
Creatinine, Ser: 0.82 mg/dL (ref 0.4–1.2)
Creatinine, Ser: 0.83 mg/dL (ref 0.4–1.2)
GFR calc non Af Amer: >60 mL/min (ref >60)
GFR calc non Af Amer: >60 mL/min (ref >60)
GFR calc non Af Amer: >60 mL/min (ref >60)
Glucose, Bld: 112 mg/dL — ABNORMAL HIGH (ref 70–99)
Glucose, Bld: 122 mg/dL — ABNORMAL HIGH (ref 70–99)
Glucose, Bld: 127 mg/dL — ABNORMAL HIGH (ref 70–99)

## 2010-11-30 LAB — MAGNESIUM: Magnesium: 2.2 mg/dL (ref 1.5–2.5)

## 2010-12-01 LAB — COMPREHENSIVE METABOLIC PANEL
AST: 17 U/L (ref 0–37)
Albumin: 3.3 g/dL — ABNORMAL LOW (ref 3.5–5.2)
BUN: 11 mg/dL (ref 6–23)
BUN: 9 mg/dL (ref 6–23)
CO2: 32 mEq/L (ref 19–32)
CO2: 37 mEq/L — ABNORMAL HIGH (ref 19–32)
Calcium: 8.7 mg/dL (ref 8.4–10.5)
Chloride: 101 mEq/L (ref 96–112)
Chloride: 104 mEq/L (ref 96–112)
Creatinine, Ser: 0.85 mg/dL (ref 0.4–1.2)
Creatinine, Ser: 0.87 mg/dL (ref 0.4–1.2)
GFR calc non Af Amer: >60 mL/min (ref >60)
GFR calc non Af Amer: >60 mL/min (ref >60)
Glucose, Bld: 137 mg/dL — ABNORMAL HIGH (ref 70–99)
Sodium: 141 mEq/L (ref 135–145)
Total Bilirubin: 0.3 mg/dL (ref 0.3–1.2)
Total Bilirubin: 0.5 mg/dL (ref 0.3–1.2)

## 2010-12-01 LAB — OSMOLALITY, URINE
Osmolality, Ur: 246 mOsm/kg — ABNORMAL LOW (ref 390–1090)
Osmolality, Ur: 246 mOsm/kg — ABNORMAL LOW (ref 390–1090)

## 2010-12-01 LAB — URINALYSIS, ROUTINE W REFLEX MICROSCOPIC
Bilirubin Urine: NEGATIVE
Ketones, ur: NEGATIVE mg/dL
Nitrite: NEGATIVE
Protein, ur: NEGATIVE mg/dL
Urobilinogen, UA: 0.2 mg/dL (ref 0.0–1.0)
pH: 6 (ref 5.0–8.0)

## 2010-12-01 LAB — BASIC METABOLIC PANEL
BUN: 9 mg/dL (ref 6–23)
Creatinine, Ser: 0.78 mg/dL (ref 0.4–1.2)
GFR calc Af Amer: >60 mL/min (ref >60)
GFR calc non Af Amer: >60 mL/min (ref >60)
Potassium: 2.7 mEq/L — CL (ref 3.5–5.1)

## 2010-12-01 LAB — CBC
MCH: 31 pg (ref 26.0–34.0)
MCV: 91.1 fL (ref 78.0–100.0)
Platelets: 195 10*3/uL (ref 150–400)
Platelets: 230 10*3/uL (ref 150–400)
RBC: 3.72 MIL/uL — ABNORMAL LOW (ref 3.87–5.11)
RDW: 14.2 % (ref 11.5–15.5)
WBC: 8.6 10*3/uL (ref 4.0–10.5)

## 2010-12-01 LAB — DIFFERENTIAL
Basophils Absolute: 0 10*3/uL (ref 0.0–0.1)
Basophils Absolute: 0 10*3/uL (ref 0.0–0.1)
Eosinophils Relative: 2 % (ref 0–5)
Lymphocytes Relative: 30 % (ref 12–46)
Lymphocytes Relative: 32 % (ref 12–46)
Lymphs Abs: 2.6 10*3/uL (ref 0.7–4.0)
Neutro Abs: 3.8 10*3/uL (ref 1.7–7.7)
Neutrophils Relative %: 58 % (ref 43–77)

## 2010-12-01 LAB — NA AND K (SODIUM & POTASSIUM), RAND UR: Sodium, Ur: 20 mEq/L

## 2010-12-01 LAB — ALDOSTERONE + RENIN ACTIVITY W/ RATIO: Aldosterone: 26 ng/dL

## 2010-12-01 LAB — OSMOLALITY: Osmolality: 292 mOsm/kg (ref 275–300)

## 2010-12-01 LAB — CHLORIDE, URINE, RANDOM: Chloride Urine: 38 mEq/L

## 2010-12-01 LAB — HEMOGLOBIN A1C
Hgb A1c MFr Bld: 6.3 % — ABNORMAL HIGH (ref <5.7)
Mean Plasma Glucose: 134 mg/dL — ABNORMAL HIGH (ref <117)

## 2010-12-01 LAB — MAGNESIUM: Magnesium: 2.3 mg/dL (ref 1.5–2.5)

## 2010-12-26 ENCOUNTER — Telehealth: Payer: Self-pay | Admitting: Family Medicine

## 2010-12-26 ENCOUNTER — Other Ambulatory Visit: Payer: Self-pay | Admitting: *Deleted

## 2010-12-26 ENCOUNTER — Other Ambulatory Visit: Payer: Self-pay

## 2010-12-26 MED ORDER — ALPRAZOLAM 0.5 MG PO TABS: 0.5000 mg | ORAL_TABLET | Freq: Every evening | ORAL | Status: DC | PRN

## 2010-12-26 MED ORDER — ALPRAZOLAM 0.5 MG PO TABS: 0.5000 mg | ORAL_TABLET | Freq: Every evening | ORAL | Status: AC | PRN

## 2010-12-26 NOTE — Telephone Encounter (Signed)
Sent to pharmacy 

## 2010-12-26 NOTE — Telephone Encounter (Signed)
Patient needs to schedule ov. If cannot wait until first available appointment, needs to go to urgent care or ER.

## 2010-12-27 ENCOUNTER — Emergency Department (HOSPITAL_COMMUNITY)
Admission: EM | Admit: 2010-12-27 | Discharge: 2010-12-27 | Disposition: A | Discharge status: Home / Self Care | Payer: Medicare Other | Attending: Emergency Medicine | Admitting: Emergency Medicine

## 2010-12-27 DIAGNOSIS — H9209 Otalgia, unspecified ear: Secondary | ICD-10-CM | POA: Insufficient documentation

## 2010-12-27 DIAGNOSIS — Z79899 Other long term (current) drug therapy: Secondary | ICD-10-CM | POA: Insufficient documentation

## 2010-12-27 DIAGNOSIS — R631 Polydipsia: Secondary | ICD-10-CM | POA: Insufficient documentation

## 2010-12-27 DIAGNOSIS — M810 Age-related osteoporosis without current pathological fracture: Secondary | ICD-10-CM | POA: Insufficient documentation

## 2010-12-27 DIAGNOSIS — E119 Type 2 diabetes mellitus without complications: Secondary | ICD-10-CM | POA: Insufficient documentation

## 2010-12-27 DIAGNOSIS — I10 Essential (primary) hypertension: Secondary | ICD-10-CM | POA: Insufficient documentation

## 2010-12-27 DIAGNOSIS — R5383 Other fatigue: Secondary | ICD-10-CM | POA: Insufficient documentation

## 2010-12-27 DIAGNOSIS — R5381 Other malaise: Secondary | ICD-10-CM | POA: Insufficient documentation

## 2010-12-27 LAB — DIFFERENTIAL
Basophils Absolute: 0.1 10*3/uL (ref 0.0–0.1)
Lymphocytes Relative: 30 % (ref 12–46)
Monocytes Absolute: 0.4 10*3/uL (ref 0.1–1.0)
Monocytes Relative: 7 % (ref 3–12)
Neutro Abs: 3.7 10*3/uL (ref 1.7–7.7)

## 2010-12-27 LAB — URINE MICROSCOPIC-ADD ON

## 2010-12-27 LAB — CBC
HCT: 38.5 % (ref 36.0–46.0)
Hemoglobin: 13 g/dL (ref 12.0–15.0)
MCH: 29.8 pg (ref 26.0–34.0)
MCHC: 33.8 g/dL (ref 30.0–36.0)
RBC: 4.36 MIL/uL (ref 3.87–5.11)

## 2010-12-27 LAB — BASIC METABOLIC PANEL
CO2: 28 mEq/L (ref 19–32)
Calcium: 8.8 mg/dL (ref 8.4–10.5)
Chloride: 96 mEq/L (ref 96–112)
Glucose, Bld: 561 mg/dL (ref 70–99)
Sodium: 131 mEq/L — ABNORMAL LOW (ref 135–145)

## 2010-12-27 LAB — URINALYSIS, ROUTINE W REFLEX MICROSCOPIC
Bilirubin Urine: NEGATIVE
Glucose, UA: >1000 mg/dL — AB
Specific Gravity, Urine: 1.01 (ref 1.005–1.030)
Urobilinogen, UA: 0.2 mg/dL (ref 0.0–1.0)

## 2010-12-27 LAB — GLUCOSE, CAPILLARY
Glucose-Capillary: 578 mg/dL (ref 70–99)
Glucose-Capillary: 430 mg/dL — ABNORMAL HIGH (ref 70–99)
Glucose-Capillary: 241 mg/dL — ABNORMAL HIGH (ref 70–99)

## 2010-12-27 LAB — POCT CARDIAC MARKERS

## 2010-12-28 ENCOUNTER — Inpatient Hospital Stay (HOSPITAL_COMMUNITY)
Admission: RE | Admit: 2010-12-28 | Discharge: 2010-12-28 | Disposition: A | Discharge status: Home / Self Care | Payer: No Typology Code available for payment source | Source: Ambulatory Visit | Attending: Emergency Medicine | Admitting: Emergency Medicine

## 2010-12-28 ENCOUNTER — Ambulatory Visit (INDEPENDENT_AMBULATORY_CARE_PROVIDER_SITE_OTHER): Payer: Medicare Other | Admitting: Family Medicine

## 2010-12-28 ENCOUNTER — Encounter: Payer: Self-pay | Admitting: Family Medicine

## 2010-12-28 ENCOUNTER — Inpatient Hospital Stay (HOSPITAL_COMMUNITY)
Admission: EM | Admit: 2010-12-28 | Discharge: 2010-12-31 | DRG: 639 | Disposition: A | Discharge status: Home Health | Payer: Medicare Other | Source: Ambulatory Visit | Attending: Emergency Medicine | Admitting: Emergency Medicine

## 2010-12-28 VITALS — BP 160/78 | HR 88 | Resp 16 | Ht 62.0 in | Wt 130.1 lb

## 2010-12-28 DIAGNOSIS — I10 Essential (primary) hypertension: Secondary | ICD-10-CM | POA: Diagnosis present

## 2010-12-28 DIAGNOSIS — I129 Hypertensive chronic kidney disease with stage 1 through stage 4 chronic kidney disease, or unspecified chronic kidney disease: Secondary | ICD-10-CM

## 2010-12-28 DIAGNOSIS — Z794 Long term (current) use of insulin: Secondary | ICD-10-CM

## 2010-12-28 DIAGNOSIS — E1100 Type 2 diabetes mellitus with hyperosmolarity without nonketotic hyperglycemic-hyperosmolar coma (NKHHC): Principal | ICD-10-CM | POA: Diagnosis present

## 2010-12-28 DIAGNOSIS — IMO0002 Reserved for concepts with insufficient information to code with codable children: Secondary | ICD-10-CM

## 2010-12-28 DIAGNOSIS — Z853 Personal history of malignant neoplasm of breast: Secondary | ICD-10-CM

## 2010-12-28 DIAGNOSIS — M810 Age-related osteoporosis without current pathological fracture: Secondary | ICD-10-CM | POA: Diagnosis present

## 2010-12-28 DIAGNOSIS — E876 Hypokalemia: Secondary | ICD-10-CM | POA: Diagnosis not present

## 2010-12-28 DIAGNOSIS — IMO0001 Reserved for inherently not codable concepts without codable children: Secondary | ICD-10-CM

## 2010-12-28 DIAGNOSIS — E1165 Type 2 diabetes mellitus with hyperglycemia: Secondary | ICD-10-CM

## 2010-12-28 LAB — HEPATIC FUNCTION PANEL
Albumin: 3.2 g/dL — ABNORMAL LOW (ref 3.5–5.2)
Alkaline Phosphatase: 86 U/L (ref 39–117)
Indirect Bilirubin: 0.4 mg/dL (ref 0.3–0.9)
Total Protein: 5.6 g/dL — ABNORMAL LOW (ref 6.0–8.3)

## 2010-12-28 LAB — POCT URINALYSIS DIPSTICK
Bilirubin, UA: NEGATIVE
Glucose, UA: NEGATIVE
Nitrite, UA: NEGATIVE

## 2010-12-28 LAB — CBC
HCT: 38.5 % (ref 36.0–46.0)
Hemoglobin: 13.1 g/dL (ref 12.0–15.0)
RBC: 4.3 MIL/uL (ref 3.87–5.11)

## 2010-12-28 LAB — DIFFERENTIAL
Basophils Absolute: 0 10*3/uL (ref 0.0–0.1)
Basophils Relative: 0 % (ref 0–1)
Lymphocytes Relative: 23 % (ref 12–46)
Monocytes Absolute: 0.6 10*3/uL (ref 0.1–1.0)
Monocytes Relative: 8 % (ref 3–12)
Neutro Abs: 4.5 10*3/uL (ref 1.7–7.7)
Neutrophils Relative %: 67 % (ref 43–77)

## 2010-12-28 LAB — GLUCOSE, CAPILLARY
Glucose-Capillary: 420 mg/dL — ABNORMAL HIGH (ref 70–99)
Glucose-Capillary: 146 mg/dL — ABNORMAL HIGH (ref 70–99)

## 2010-12-28 LAB — BASIC METABOLIC PANEL
CO2: 29 mEq/L (ref 19–32)
Calcium: 8.7 mg/dL (ref 8.4–10.5)
Chloride: 99 mEq/L (ref 96–112)
Creatinine, Ser: 1.04 mg/dL (ref 0.4–1.2)
GFR calc Af Amer: >60 mL/min (ref >60)
Sodium: 136 mEq/L (ref 135–145)

## 2010-12-28 LAB — GLUCOSE, POCT (MANUAL RESULT ENTRY): POC Glucose: HIGH

## 2010-12-28 LAB — URINALYSIS, MICROSCOPIC ONLY
Leukocytes, UA: NEGATIVE
Nitrite: NEGATIVE
Protein, ur: 30 mg/dL — AB
Specific Gravity, Urine: 1.015 (ref 1.005–1.030)
Urobilinogen, UA: 0.2 mg/dL (ref 0.0–1.0)

## 2010-12-28 NOTE — Progress Notes (Signed)
  Subjective:    Patient ID: Emily Jennings, female    DOB: 1933/11/28, 77 y.o.   MRN: [id number]  HPI  Drymouth, excessive thirst, weak, feels unsteady at times, yestreday morning couldn't pull herself out of bed, stumbled to bathroom.Pt had been seen in the Ed yesterday and her blood sugar was over 500, she feels worse today, and in the office her sugar is too high to read , and she has ketonuria.  She clearly needs hospitalization based on her symptoms , and for stabilization. A direct call is placed to the admitting service , and she is a direct admission.  Review of Systems Denies recent fever or chills. Denies sinus pressure, nasal congestion, ear pain or sore throat. Denies chest congestion, productive cough or wheezing. Denies chest pains, palpitations, paroxysmal nocturnal dyspnea, orthopnea and leg swelling Denies abdominal pain, nausea, vomiting,diarrhea or constipation.  Denies rectal bleeding or change in bowel movement.      Objective:   Physical Exam Patient alert and oriented , ill appearing, and dehydrated HEENT: No facial asymmetry, EOMI, no sinus tenderness, TM's clear, Oropharynx pink and moist.  Neck supple no adenopathy.  Chest: Clear to auscultation bilaterally.  CVS: S1, S2 no murmurs, no S3.  ABD: Soft non tender. Bowel sounds normal.  Ext: No edema  MS: Adequate ROM spine, shoulders, hips and knees.   CNS: CN 2-12 intact, power, tone and sensation normal throughout.        Assessment & Plan:  1. Type two diabetes: uncontrolled and ketotic, direct admission . 2. HTN: controlled.

## 2010-12-28 NOTE — Patient Instructions (Addendum)
You are being directlyto the hospital, we are calling for a bed,and I have spoken with the admitting MD.   F/u in 1 to 2 weeks

## 2010-12-29 LAB — BASIC METABOLIC PANEL
BUN: 10 mg/dL (ref 6–23)
Chloride: 102 mEq/L (ref 96–112)
Glucose, Bld: 444 mg/dL — ABNORMAL HIGH (ref 70–99)
Potassium: 3.3 mEq/L — ABNORMAL LOW (ref 3.5–5.1)
Sodium: 139 mEq/L (ref 135–145)

## 2010-12-29 LAB — DIFFERENTIAL
Basophils Absolute: 0 10*3/uL (ref 0.0–0.1)
Lymphocytes Relative: 40 % (ref 12–46)
Neutro Abs: 3 10*3/uL (ref 1.7–7.7)
Neutrophils Relative %: 48 % (ref 43–77)

## 2010-12-29 LAB — COMPREHENSIVE METABOLIC PANEL
ALT: 17 U/L (ref 0–35)
Alkaline Phosphatase: 53 U/L (ref 39–117)
CO2: 27 mEq/L (ref 19–32)
GFR calc non Af Amer: >60 mL/min (ref >60)
Glucose, Bld: 200 mg/dL — ABNORMAL HIGH (ref 70–99)
Potassium: 2.1 mEq/L — CL (ref 3.5–5.1)
Sodium: 136 mEq/L (ref 135–145)

## 2010-12-29 LAB — GLUCOSE, CAPILLARY
Glucose-Capillary: 108 mg/dL — ABNORMAL HIGH (ref 70–99)
Glucose-Capillary: 145 mg/dL — ABNORMAL HIGH (ref 70–99)
Glucose-Capillary: 206 mg/dL — ABNORMAL HIGH (ref 70–99)
Glucose-Capillary: 412 mg/dL — ABNORMAL HIGH (ref 70–99)

## 2010-12-29 LAB — LIPID PANEL
Cholesterol: 135 mg/dL (ref 0–200)
HDL: 43 mg/dL (ref >39)
LDL Cholesterol: 78 mg/dL (ref 0–99)
Triglycerides: 72 mg/dL (ref <150)

## 2010-12-29 LAB — HEMOGLOBIN A1C
Hgb A1c MFr Bld: 11.1 % — ABNORMAL HIGH (ref <5.7)
Mean Plasma Glucose: 263 mg/dL — ABNORMAL HIGH (ref <117)

## 2010-12-29 LAB — TSH: TSH: 0.645 u[IU]/mL (ref 0.350–4.500)

## 2010-12-29 LAB — CBC
HCT: 35 % — ABNORMAL LOW (ref 36.0–46.0)
Hemoglobin: 12 g/dL (ref 12.0–15.0)
RBC: 3.93 MIL/uL (ref 3.87–5.11)
WBC: 6.3 10*3/uL (ref 4.0–10.5)

## 2010-12-29 LAB — MAGNESIUM
Magnesium: 1.5 mg/dL (ref 1.5–2.5)
Magnesium: 1.8 mg/dL (ref 1.5–2.5)

## 2010-12-30 ENCOUNTER — Encounter: Payer: Self-pay | Admitting: Family Medicine

## 2010-12-30 LAB — CBC
HCT: 36.4 % (ref 36.0–46.0)
Hemoglobin: 12.4 g/dL (ref 12.0–15.0)
MCH: 30.5 pg (ref 26.0–34.0)
MCHC: 34.1 g/dL (ref 30.0–36.0)
MCV: 89.4 fL (ref 78.0–100.0)
RBC: 4.07 MIL/uL (ref 3.87–5.11)

## 2010-12-30 LAB — DIFFERENTIAL
Lymphocytes Relative: 36 % (ref 12–46)
Lymphs Abs: 3.6 10*3/uL (ref 0.7–4.0)
Monocytes Absolute: 0.7 10*3/uL (ref 0.1–1.0)
Monocytes Relative: 7 % (ref 3–12)
Neutro Abs: 5.2 10*3/uL (ref 1.7–7.7)
Neutrophils Relative %: 53 % (ref 43–77)

## 2010-12-30 LAB — URINE CULTURE
Colony Count: NO GROWTH
Culture  Setup Time: 201204120346
Special Requests: NEGATIVE

## 2010-12-30 LAB — BASIC METABOLIC PANEL
BUN: 10 mg/dL (ref 6–23)
CO2: 32 mEq/L (ref 19–32)
Calcium: 9.6 mg/dL (ref 8.4–10.5)
Chloride: 106 mEq/L (ref 96–112)
Creatinine, Ser: 0.75 mg/dL (ref 0.4–1.2)
GFR calc Af Amer: >60 mL/min (ref >60)
GFR calc non Af Amer: >60 mL/min (ref >60)
Glucose, Bld: 51 mg/dL — ABNORMAL LOW (ref 70–99)
Potassium: 3.1 mEq/L — ABNORMAL LOW (ref 3.5–5.1)
Sodium: 137 mEq/L (ref 135–145)

## 2010-12-30 LAB — GLUCOSE, CAPILLARY
Glucose-Capillary: 132 mg/dL — ABNORMAL HIGH (ref 70–99)
Glucose-Capillary: 311 mg/dL — ABNORMAL HIGH (ref 70–99)
Glucose-Capillary: 106 mg/dL — ABNORMAL HIGH (ref 70–99)
Glucose-Capillary: 263 mg/dL — ABNORMAL HIGH (ref 70–99)

## 2010-12-31 LAB — BASIC METABOLIC PANEL
BUN: 17 mg/dL (ref 6–23)
Chloride: 109 mEq/L (ref 96–112)
GFR calc non Af Amer: >60 mL/min (ref >60)
Glucose, Bld: 56 mg/dL — ABNORMAL LOW (ref 70–99)
Potassium: 4 mEq/L (ref 3.5–5.1)

## 2011-01-03 ENCOUNTER — Ambulatory Visit (INDEPENDENT_AMBULATORY_CARE_PROVIDER_SITE_OTHER): Payer: Medicare Other | Admitting: Family Medicine

## 2011-01-03 ENCOUNTER — Encounter: Payer: Self-pay | Admitting: Family Medicine

## 2011-01-03 VITALS — BP 170/90 | HR 70 | Resp 16 | Ht 61.5 in | Wt 132.1 lb

## 2011-01-03 DIAGNOSIS — E109 Type 1 diabetes mellitus without complications: Secondary | ICD-10-CM

## 2011-01-03 DIAGNOSIS — IMO0001 Reserved for inherently not codable concepts without codable children: Secondary | ICD-10-CM

## 2011-01-03 DIAGNOSIS — E119 Type 2 diabetes mellitus without complications: Secondary | ICD-10-CM

## 2011-01-03 DIAGNOSIS — I10 Essential (primary) hypertension: Secondary | ICD-10-CM

## 2011-01-03 MED ORDER — LANCETS MISC: Status: DC

## 2011-01-03 MED ORDER — INSULIN PEN NEEDLE 32G X 4 MM MISC: Status: DC

## 2011-01-03 MED ORDER — GLUCOSE BLOOD VI STRP: ORAL_STRIP | Status: DC

## 2011-01-03 NOTE — Patient Instructions (Addendum)
F/u in 3 weeks.  Chem 7 non fasting next week Wednesday  Please do start taking the spironolactone for your blood pressure, and also the metformin for your sugar  Lantus (insulin) is to be reduced to 10 units at breakfast and 10 units at supper  Call if you have any problems   pls test and record 3 times daily: before breakfast, either before or 2 hours after lunch, and at bedtime

## 2011-01-03 NOTE — Progress Notes (Signed)
  Subjective:    Patient ID: Emily Jennings, female    DOB: 1934-01-15, 77 y.o.   MRN: [id number]  HPI Pt in for hsopital f/u, she was recently started on insulin which she uses twice daily, states her sugars are better, was 177 this morning, today in the office asymptomatic at 47.  She states she feels better than at the last visit, but is clearly visibly shaken and understandably so, by her low blood sugar. She will clearly need close supervision by home health and medication adjustments   Review of Systems Denies recent fever or chills. Denies sinus pressure, nasal congestion, ear pain or sore throat. Denies chest congestion, productive cough or wheezing.Chronic seasonal allergies , worse during the Spring Denies chest pains, palpitations, paroxysmal nocturnal dyspnea, orthopnea and leg swelling Denies abdominal pain, nausea, vomiting,diarrhea or constipation.  Denies rectal bleeding or change in bowel movement. Denies dysuria, frequency, hesitancy or incontinence. Denies joint pain, swelling and limitation and mobility. Denies headaches, seizure, numbness, or tingling. Denies depression, or insomnia.She has chronic anxiety. Denies skin break down or rash.        Objective:   Physical Exam Patient alert and oriented and in no Cardiopulmonary distress.Anxious  HEENT: No facial asymmetry, EOMI, no sinus tenderness, TM's clear, Oropharynx pink and moist.  Neck supple no adenopathy.  Chest: Clear to auscultation bilaterally.  CVS: S1, S2 no murmurs, no S3.  ABD: Soft non tender. Bowel sounds normal.  Ext: No edema  [id number]  ROM spine, shoulders, hips and knees.  Skin: Intact, no ulcerations or rash noted.  Psych: Good eye contact, normal affect. Memory intact not anxious or depressed appearing.  CNS: CN 2-12 intact, power, tone and sensation normal throughout.        Assessment & Plan:  1. Type 2 DM : newly placed on insulin and is overcorrected with asymptomatic  hypoglycemia 2. Hypertension:Controlled, no changes in medication.  3. Hyperlipidemia: Hyperlipidemia:Low fat diet discussed and encouraged. 4. Chronic anxiety: deteriorated due to recent hospitalization, continue xanax as before

## 2011-01-11 ENCOUNTER — Other Ambulatory Visit: Payer: Self-pay | Admitting: Family Medicine

## 2011-01-16 ENCOUNTER — Telehealth: Payer: Self-pay | Admitting: Family Medicine

## 2011-01-16 ENCOUNTER — Other Ambulatory Visit (INDEPENDENT_AMBULATORY_CARE_PROVIDER_SITE_OTHER): Payer: No Typology Code available for payment source

## 2011-01-16 ENCOUNTER — Encounter: Payer: Self-pay | Admitting: Family Medicine

## 2011-01-16 DIAGNOSIS — E109 Type 1 diabetes mellitus without complications: Secondary | ICD-10-CM

## 2011-01-16 MED ORDER — GLUCOSE BLOOD VI STRP: ORAL_STRIP | Status: DC

## 2011-01-16 NOTE — Telephone Encounter (Signed)
Patient aware. Strips and lab order sent

## 2011-01-17 ENCOUNTER — Ambulatory Visit (INDEPENDENT_AMBULATORY_CARE_PROVIDER_SITE_OTHER): Payer: Medicare Other | Admitting: Family Medicine

## 2011-01-17 ENCOUNTER — Encounter: Payer: Self-pay | Admitting: Family Medicine

## 2011-01-17 VITALS — BP 160/80 | HR 75 | Resp 16 | Ht 61.5 in | Wt 130.1 lb

## 2011-01-17 DIAGNOSIS — E119 Type 2 diabetes mellitus without complications: Secondary | ICD-10-CM

## 2011-01-17 DIAGNOSIS — R197 Diarrhea, unspecified: Secondary | ICD-10-CM

## 2011-01-17 DIAGNOSIS — E785 Hyperlipidemia, unspecified: Secondary | ICD-10-CM

## 2011-01-17 DIAGNOSIS — I10 Essential (primary) hypertension: Secondary | ICD-10-CM

## 2011-01-17 LAB — BASIC METABOLIC PANEL
CO2: 32 mEq/L (ref 19–32)
Glucose, Bld: 89 mg/dL (ref 70–99)
Potassium: 3.8 mEq/L (ref 3.5–5.3)
Sodium: 144 mEq/L (ref 135–145)

## 2011-01-17 MED ORDER — SPIRONOLACTONE 25 MG PO TABS: 25.0000 mg | ORAL_TABLET | Freq: Two times a day (BID) | ORAL | Status: DC

## 2011-01-17 MED ORDER — DIPHENOXYLATE-ATROPINE 2.5-0.025 MG PO TABS: 1.0000 | ORAL_TABLET | Freq: Four times a day (QID) | ORAL | Status: AC | PRN

## 2011-01-17 NOTE — Patient Instructions (Signed)
F/u in 4 weeks  Chem 7 one day before next visit, non fasting.  Dose increase on spironolactone to twice daily.  Lomotil will be sent in for diarreah, use only if needed

## 2011-01-17 NOTE — Assessment & Plan Note (Signed)
Medication compliance addressed. Commitment to regular exercise and healthy  food choices, with portion control discussed. DASH diet and low fat diet discussed and literature offered. Changes in medication made at this visit.  

## 2011-01-17 NOTE — Progress Notes (Signed)
  Subjective:    Patient ID: Emily Jennings, female    DOB: 31-Aug-1934, 77 y.o.   MRN: [id number]  HPI Diarreah up to 6 times today, wants med for this, has been going on for approx 4 days. She denies nausea or vomiting. Pt has had to taper down the insulin she was started on and at this time is only on metformin for blood uagr control. States the reason her sugar became so high is that she had ben eating an excessive amy of chocolate and sweets.    Review of Systems Denies recent fever or chills. Denies sinus pressure, nasal congestion, ear pain or sore throat. Denies chest congestion, productive cough or wheezing. Denies chest pains, palpitations, paroxysmal nocturnal dyspnea, orthopnea and leg swelling  Denies dysuria, frequency, hesitancy or incontinence.  Denies depression,but is experiencing increased  anxiety with the changes in her health.. Denies skin break down or rash.        Objective:   Physical Exam Patient alert and oriented and in no Cardiopulmonary distress.  HEENT: No facial asymmetry, EOMI, no sinus tenderness,  Oropharynx pink and moist.  Neck supple no adenopathy.  Chest: Clear to auscultation bilaterally.  CVS: S1, S2 no murmurs, no S3.  ABD: Soft non tender. Bowel sounds normal.  Ext: No edema  MS: Adequate ROM spine, shoulders, hips and knees.  Skin: Intact, no ulcerations or rash noted.  Psych: Good eye contact, normal affect. Memory intact  anxious but not  depressed appearing.  CNS: CN 2-12 intact, power, tone and sensation normal throughout.        Assessment & Plan:

## 2011-01-19 ENCOUNTER — Telehealth: Payer: Self-pay | Admitting: Family Medicine

## 2011-01-19 ENCOUNTER — Other Ambulatory Visit (INDEPENDENT_AMBULATORY_CARE_PROVIDER_SITE_OTHER): Payer: No Typology Code available for payment source

## 2011-01-19 DIAGNOSIS — E119 Type 2 diabetes mellitus without complications: Secondary | ICD-10-CM

## 2011-01-19 MED ORDER — METFORMIN HCL 500 MG PO TABS: 500.0000 mg | ORAL_TABLET | Freq: Two times a day (BID) | ORAL | Status: DC

## 2011-01-19 NOTE — Telephone Encounter (Signed)
Patient aware to use benadryl or hydrocortisone cream for itch and to make appt if it gets worse

## 2011-01-19 NOTE — H&P (Signed)
NAME:  Emily Jennings, Emily Jennings                 ACCOUNT NO.:  [id number]  MEDICAL RECORD NO.:  [id number]           PATIENT TYPE:  I  LOCATION:  IC11                          FACILITY:  [facility]  PHYSICIAN:  Merlyn Bollen, DO         DATE OF BIRTH:  1934/02/03  DATE OF ADMISSION:  12/28/2010 DATE OF DISCHARGE:  LH                             HISTORY & PHYSICAL   CHIEF COMPLAINT:  Weakness, malaise, myalgias, polyuria.  HISTORY OF PRESENT ILLNESS:  The patient is a 77 year old female who states for the last 2-3 weeks she has felt very weak, very tired all the time.  She has had aches and pains she does usually have.  Her mouth has been very dry and her eyes are dry.  She states that she has also been urinating a lot.  Yesterday, she felt so bad, she can hardly get out of bed, so she came to the emergency department.  In the emergency department yesterday, the patient's glucose was 575.  They did minced to get it down to 200, then they sent her home.  Today, she was sick all over again with the same symptoms and so she comes here today as a direct admit from her primary care doctor, Dr. Moshe Cipro.  PAST MEDICAL HISTORY:  Significant for left breast cancer, hypertension, diabetes mellitus new diagnosis, hyperkalemia, osteoporosis, seasonal allergies.  PAST SURGICAL HISTORY:  Left breast lumpectomy.  MEDICATIONS AT HOME: 1. Alprazolam 0.5 mg one p.o. at bedtime. 2. Amiloride 5 mg 2 p.o. daily. 3. Amlodipine 10 mg one p.o. daily. 4. Artificial tears 2 drops daily. 5. Calcium carbonate D3 500 mg/200 mg one p.o. 3 times daily. 6. Cetirizine 10 mg one p.o. daily. 7. Flonase 2 sprays daily as needed for seasonal allergies. 8. Losartan 50 mg 2 p.o. daily. 9. Metoprolol 25 mg one p.o. daily. 10.Potassium chloride 20 mEq one p.o. daily. 11.Tamoxifen 20 mg one p.o. daily. 12.Vitamin C 1000 mg one p.o. daily. 13.Vitamin E one tablet daily.  ALLERGIES:  NKDA.  SOCIAL HISTORY:  No tobacco.  No alcohol.   No recreational drug use. She has 2 children, lives at home with her husband.  REVIEW OF SYSTEMS:  CONSTITUTIONAL:  Negative for fevers.  Positive for chills, positive for weakness, positive for malaise, positive for myalgias.  CNS:  Positive for lightheadedness.  Negative for seizure. Negative for specific limb weakness.  ENT:  No nasal congestion, no throat pain, no coryza.  CARDIOVASCULAR:  No chest pain.  No palpitations.  No orthopnea.  RESPIRATORY:  No cough.  No shortness of breath.  No wheezing.  GASTROINTESTINAL:  Positive for nausea.  Negative for vomiting.  Negative for constipation.  Negative for diarrhea. GENITOURINARY:  Positive for polyuria.  Negative for dysuria.  Negative for urinary retention.  RENAL:  No flank pain, no swelling, no pruritus. SKIN:  No rashes, no sores, no lesions.  HEMATOLOGIC:  No easy bruising, no purpura, no clots.  LYMPHS:  No lymphadenopathy.  No painful lymph nodes.  No specific lymph swelling.  FAMILY HISTORY:  Mother died at age 39 of arterial  diseases.  She has 2 brothers who both have had MIs, and hypertension runs in the family.  PHYSICAL EXAMINATION:  VITAL SIGNS:  Heart rate 73, respiratory rate 20, blood pressure 176/87, and O2 sat 94% on room air. GENERAL:  The patient is awake, alert, and oriented x3, and able to give fair history. EYES:  Pupils equal, round, reactive to light and accommodation. External ocular movements bilaterally intact.  Sclarea nonicteric, noninjected. MOUTH:  Oral mucosa is dry.  No lesions, no sores.  Pharynx:  Clear.  No erythema.  No exudate. NECK:  Negative for JVD.  Negative for thyromegaly.  Negative for lymphadenopathy. HEART:  Regular rate and rhythm at 80 beats per minute without murmur, ectopy, or gallop.  No lateral PMI.  No thrills. LUNGS:  Clear to auscultation bilaterally without wheezes, rales, or rhonchi.  No increased work of breathing.  No tactile fremitus. ABDOMEN:  Soft, nontender and  nondistended.  Positive bowel sounds.  No hepatosplenomegaly.  No hernias palpated. EXTREMITIES:  Negative for cyanosis, clubbing, or edema.  The patient is somewhat diminished dorsalis pedis and popliteal pulses bilaterally.  No carotid bruits bilaterally. NEUROLOGIC:  Cranial nerves II through XII grossly intact.  Motor and sensory intact.  LABORATORY DATA:  Some of the patient's laboratory is still pending. Sodium 136, potassium 3.8, chloride 99, CO2 is 29, BUN 17, creatinine 1.04, calcium 8.7, GFR is 52, total protein is 5.6, albumin 3.2, AST is 23, ALT 23, alk phos 36, T. bili 0.5, D. bili 0.1, and indirect bilirubin 0.4.  WBC is 6.8, hemoglobin 13.1, hematocrit 38.5, and platelets are 215, 67% neutrophils, 23% lymphs.  Remainder of laboratory is pending.  Last night, the patient's cardiac enzymes were negative.  ASSESSMENT: 1. Nonketotic hyperosmolar syndrome. 2. Diabetes mellitus type 2, uncontrolled. 3. Hypertension, also uncontrolled. 4. Generalized weakness. 5. History of hyperkalemia.  PLAN: 1. IV fluid. 2. Insulin drip. 3. We will put the patient on medication to keep her glucose in     control as outpatient.I spent 53 minutes on this admission.                                           ______________________________ Karie Kirks, DO     AS/MEDQ  D:  12/28/2010  T:  12/29/2010  Job:  [id number]  cc:   Norwood Levo. Moshe Cipro, M.D. Fax: [phone]  Electronically Signed by Karie Kirks DO on 01/19/2011 06:06:35 PM

## 2011-01-19 NOTE — Telephone Encounter (Signed)
Spoke with nurse. Received clarification

## 2011-01-19 NOTE — Discharge Summary (Signed)
NAME:  Emily Jennings, Emily Jennings                 ACCOUNT NO.:  [id number]  MEDICAL RECORD NO.:  [id number]           PATIENT TYPE:  I  LOCATION:  A217                          FACILITY:  [facility]  PHYSICIAN:  Lynnae Ludemann, DO         DATE OF BIRTH:  1934-07-04  DATE OF ADMISSION:  12/28/2010 DATE OF DISCHARGE:  04/14/2012LH                              DISCHARGE SUMMARY   ADMISSION DIAGNOSES: 1. Nonketotic hyperosmolar syndrome. 2. Diabetes mellitus, type 2, uncontrolled. 3. Hypertension, uncontrolled. 4. Hypokalemia. 5. Generalized weakness.  HISTORY OF PRESENT ILLNESS:  Please see H and [place] COURSE:  The patient was admitted.  She was given IV fluids. She was on insulin drip.  Her potassium was supplemented.  The patient's blood glucoses were very difficult to bring under control.  The patient is requiring insulin.  She was not controlled at all on metformin that she was taking at home.  Hypokalemia in the patient was severe, required a great deal of supplementation to bring under control.  The patient is on 20 mEq of potassium 3 times daily at home.  In the last 24 hours, her blood sugars have been better and at this morning her potassium is 4.1. The patient will be discharged to home in good condition.  DISCHARGE DIAGNOSES: 1. Hypokalemia, resolved. 2. Hyperosmolar nonketotic syndrome due to uncontrolled diabetes     mellitus type 2, under better control. 3. Hypotension. 4. Osteoporosis. 5. History of breast cancer.  DISCHARGE INSTRUCTIONS:  The patient will be discharged to home with home health for glucose control in good condition.  DIET:  2000-calorie ADA.  MEDICATIONS: 1. Acetaminophen 325 two p.o. every 4 hours as needed for pain. 2. Ascorbic acid 500 mg 2 p.o. by mouth daily. 3. She was given an diabetes starter kit inpatient.  She can take it     to home and continue to use it. 4. Lantus SoloSTAR Pen 26 units subcu every morning and she is to take     14 units subcu  every evening. 5. Metoprolol XL 50 mg 1 p.o. daily.  If the patient prefers, she can     take 2 of the 25 mg tablets that she is taking at home.  She can     take those twice daily until she runs out, then she can the 50. 6. Spirolactone 25 mg 1 p.o. daily. 7. Alprazolam 5 mg 1 p.o. at bedtime. 8. Amlodipine 10 mg 1 p.o. daily. 9. Artificial tears 2 drops both eyes as needed daily. 10.Amiloride 5 mg 1 p.o. daily. 11.Calcium carbonate with vitamin D3 500/200 mg 1 p.o. t.i.d. 12.Cetirizine 10 mg 1 p.o. daily. 13.Flonase 2 sprays intranasally daily as needed for nasal congestion. 14.Losartan 50 mg 1 p.o. daily. 15.Potassium chloride 20 mEq 1 p.o. 3 times daily. 16.Tamoxifen 20 mg 1 p.o. daily. 17.Vitamin D3 400 units 1 p.o. daily.  She is to follow up with Dr. Moshe Cipro in 2-4 weeks and my recommendation is that she have a chemistry drawn at that visit.  The patient is to check fingerstick blood sugar each morning before  breakfast, every evening, and before dinner.  She is to record these and take them when she visits Dr. Moshe Cipro.  The patient has also been given a script for glucometer.  I spent 40 minutes on this discharge.                                           ______________________________ Karie Kirks, DO     AS/MEDQ  D:  12/31/2010  T:  12/31/2010  Job:  [id number]  cc:   Norwood Levo. Moshe Cipro, M.D. Fax: [phone]  Electronically Signed by Karie Kirks DO on 01/19/2011 06:06:31 PM

## 2011-01-19 NOTE — Telephone Encounter (Signed)
Med sent in.

## 2011-01-22 DIAGNOSIS — E119 Type 2 diabetes mellitus without complications: Secondary | ICD-10-CM | POA: Insufficient documentation

## 2011-01-22 NOTE — Assessment & Plan Note (Signed)
Pt will be maintained  on oral agent only at this time

## 2011-01-22 NOTE — Assessment & Plan Note (Signed)
Nearly at goal on no med, however the disease trio is sufficient to justify addition of a statin, will discuss in the future

## 2011-01-24 DIAGNOSIS — IMO0001 Reserved for inherently not codable concepts without codable children: Secondary | ICD-10-CM

## 2011-01-24 DIAGNOSIS — M810 Age-related osteoporosis without current pathological fracture: Secondary | ICD-10-CM

## 2011-01-24 DIAGNOSIS — I10 Essential (primary) hypertension: Secondary | ICD-10-CM

## 2011-01-24 DIAGNOSIS — Z794 Long term (current) use of insulin: Secondary | ICD-10-CM

## 2011-01-24 DIAGNOSIS — Z853 Personal history of malignant neoplasm of breast: Secondary | ICD-10-CM

## 2011-01-24 DIAGNOSIS — E1165 Type 2 diabetes mellitus with hyperglycemia: Secondary | ICD-10-CM

## 2011-01-25 ENCOUNTER — Telehealth: Payer: Self-pay | Admitting: *Deleted

## 2011-01-25 ENCOUNTER — Other Ambulatory Visit (INDEPENDENT_AMBULATORY_CARE_PROVIDER_SITE_OTHER): Payer: No Typology Code available for payment source | Admitting: *Deleted

## 2011-01-25 DIAGNOSIS — I10 Essential (primary) hypertension: Secondary | ICD-10-CM

## 2011-01-25 MED ORDER — AMLODIPINE BESYLATE 5 MG PO TABS: 5.0000 mg | ORAL_TABLET | Freq: Every day | ORAL | Status: DC

## 2011-01-25 MED ORDER — METOPROLOL TARTRATE 50 MG PO TABS: 50.0000 mg | ORAL_TABLET | Freq: Every day | ORAL | Status: DC

## 2011-01-25 NOTE — Telephone Encounter (Signed)
The 50 mg talet, her last ov her bp was high, pls correct our list to co-ordinate with this

## 2011-01-25 NOTE — Telephone Encounter (Signed)
Addended by: Baldomero Lamy on: 01/25/2011 11:53 AM   Modules accepted: Orders, Medications

## 2011-01-25 NOTE — Telephone Encounter (Signed)
Home health nurse aware and changed in patient chart

## 2011-01-25 NOTE — Telephone Encounter (Signed)
Home health nurse aware patient should be on metoprolol 50

## 2011-01-31 NOTE — Op Note (Signed)
NAME:  [name], [name] NO.:  [id number]   MEDICAL RECORD NO.:  [id number]          PATIENT TYPE:  AMB   LOCATION:  DSC                          FACILITY:  [facility]   PHYSICIAN:  Kathrin Penner, M.D.   DATE OF BIRTH:  June 27, 1934   DATE OF PROCEDURE:  DATE OF DISCHARGE:                               OPERATIVE REPORT   PREOPERATIVE DIAGNOSIS:  Ductal carcinoma in situ of the left breast.   POSTOPERATIVE DIAGNOSIS:  Ductal carcinoma in situ of the left breast.   PATHOLOGY:  Pending.   PROCEDURE:  Needle-localized excision of left breast lesion.   SURGEON:  Kathrin Penner, MD   ASSISTANT:  OR tech.   ANESTHESIA:  General.   SPECIMENS TO THE LAB:  Left breast tissue.   ESTIMATED BLOOD LOSS:  Minimal.   COMPLICATIONS:  None.   The patient returned to the PACU in excellent condition.   The patient is a 77 year old lady who presents with a category 4  suspicious abnormality of the left breast which on core biopsy shows  carcinoma in situ.  The patient comes to the operating room now after  the risks and potential benefits of surgery have been fully discussed,  all questions answered, and consent for surgery obtained.   PROCEDURE:  Following the induction of satisfactory general anesthesia  with the patient positioned supinely, the left breast was prepped and  draped to be included in the sterile operative field.  Identification of  the patient as Emily Jennings and the site to be operated on as the left  side was made.  I made a transversely placed incision between the two  localizing needles within the breast, deepened this through the skin and  subcutaneous tissues raising of flaps inferiorly and superiorly to  include the superior most needle and inferiorly and anteriorly to  include the more anterior and superior needle.  A wide wedge of breast  tissue was taken all the way down to the chest wall and marked with  sutures, long suture anterior margin and double  suture medial margin.  This was sent for specimen mammography which was noted to have a clip  and the wires well in place.   All areas of dissection checked for hemostasis and noted to be dry.  Additional bleeding points were treated with some electrocautery.  Breast tissue was reapproximated with interrupted 2-0 Vicryl sutures.  Subcutaneous tissue  was also closed with interrupted 2-0 Vicryl sutures.  Skin closed with a  5-0 Monocryl suture, then reinforced with Steri-Strips and Dermabond.  The patient then removed from the operating room to the recovery room in  stable condition.  She tolerated the procedure well.      Kathrin Penner, M.D.  Electronically Signed     PB/MEDQ  D:  04/30/2008  T:  05/01/2008  Job:  [id number]

## 2011-01-31 NOTE — Op Note (Signed)
NAME:  QUINASIA, GUSSLER NO.:  [id number]   MEDICAL RECORD NO.:  [id number]          PATIENT TYPE:  AMB   LOCATION:  [place]                          FACILITY:  [facility]   PHYSICIAN:  Kathrin Penner, M.D.   DATE OF BIRTH:  08/27/1934   DATE OF PROCEDURE:  07/09/2008  DATE OF DISCHARGE:                               OPERATIVE REPORT   PREOPERATIVE DIAGNOSIS:  Carcinoma of the left breast, status post  excision with close margins.   POSTOPERATIVE DIAGNOSIS:  Carcinoma of the left breast, status post  excision with close margins.   PROCEDURE:  Re-excision of left breast lumpectomy site.   SURGEON:  Kathrin Penner, MD.   ASSISTANT:  OR nurse.   ANESTHESIA:  General.   SPECIMENS TO THE LABORATORY:  Breast tissue, left breast.   ESTIMATED BLOOD LOSS:  Minimal.   COMPLICATIONS:  None.   The patient returned to the PACU in excellent condition.   Ms. Mardean Mayerson is a 77 year old lady with a core-biopsy diagnosed DCIS  of the left breast.  Final pathology report shows only DCIS, however  margins are quite close measuring up to 0.1 cm from the cauterized  margins.  The patient comes back to the operating room now for re-  excision for wider margins prior to radiation therapy.   DESCRIPTION OF PROCEDURE:  The patient was positioned supine and  following the induction of general anesthesia, the left breast was  prepped and draped to be included in a sterile operative field.  The  patient was identified as [name] and the operation to be done was a  left breast re-excision.  I made an elliptical incision around the old  biopsy incision deepening this through the skin raising superior,  medial, inferior, and lateral flaps taking the entire biopsy cavity down  to the chest wall and removing it in its entirety and forwarding it to  pathologic evaluation.  I marked the superior margin with a single  suture and the medial margin with a double suture.  Hemostasis was  obtained with electrocautery.  Sponge and instrument counts were doubly  verified.  Subcutaneous  tissue was closed with interrupted 3-0 Vicryl sutures.  Skin was closed  with running 5-0 Monocryl suture and reinforced with Steri-Strips.  Sterile dressings were applied.  Anesthetic reversed.  The patient  removed from the operating room to the recovery room in stable  condition.  She tolerated the procedure well.      Kathrin Penner, M.D.  Electronically Signed     PB/MEDQ  D:  07/09/2008  T:  07/10/2008  Job:  [id number]   cc:   Kathrin Penner, M.D.

## 2011-02-03 NOTE — H&P (Signed)
NAME:  Emily Jennings, Emily Jennings                 ACCOUNT NO.:  [id number]   MEDICAL RECORD NO.:  [id number]          PATIENT TYPE:  AMB   LOCATION:  DAY                           FACILITY:  [facility]   PHYSICIAN:  Leane Para C. Tamala Julian, M.D.   DATE OF BIRTH:  [birth year]   DATE OF ADMISSION:  DATE OF DISCHARGE:  [place]                                HISTORY & PHYSICAL   A 77 year old female was referred for colonoscopy.  The patient's father has a  history of colon cancer and died of complications of colon cancer at age 50.  The patient has had loose bowel movements, though he has not had rectal  bleeding.  He is scheduled for colonoscopy.   PAST HISTORY:  1.  He has hypertension.  2.  Hypokalemia.  3.  Osteoporosis.   MEDICATIONS:  1.  Altace 10 mg every day.  2.  Fosamax 70 mg a week.  3.  Klor-Con 20 mEq one day with three tabs every other day.  4.  Aldactone 25 mg daily.  5.  Alprazolam 0.5 mg four times daily.  6.  Nifedipine 90 mg daily.  7.  Benicar HCT 40/25 daily.  8.  Singulair 10 mg daily.   PHYSICAL EXAMINATION:  VITAL SIGNS:  Blood pressure 158/82, pulse 18,  respirations 72, weight 125 pounds.  HEENT:  Unremarkable.  NECK:  Supple.  No JVD, bruit, adenopathy, or thyromegaly.  CHEST:  Clear to auscultation.  HEART:  Regular rate and rhythm without murmur, gallop or rub.  ABDOMEN:  Soft, nontender.  No masses.  EXTREMITIES:  No cyanosis, clubbing, or edema.  NEUROLOGIC:  No focal motor, sensory, or cerebellar deficit.   IMPRESSION:  1.  Family history of colon cancer.  2.  Episodic diarrhea.  3.  Hypertension.  4.  Hypokalemia.  5.  Osteoporosis.   PLAN:  Screening colonoscopy.      LCS/MEDQ  D:  12/21/2004  T:  12/21/2004  Job:  [id number]

## 2011-02-14 ENCOUNTER — Encounter: Payer: Self-pay | Admitting: Family Medicine

## 2011-02-15 ENCOUNTER — Encounter: Payer: Self-pay | Admitting: Family Medicine

## 2011-02-15 ENCOUNTER — Ambulatory Visit (INDEPENDENT_AMBULATORY_CARE_PROVIDER_SITE_OTHER): Payer: Medicare Other | Admitting: Family Medicine

## 2011-02-15 ENCOUNTER — Telehealth: Payer: Self-pay | Admitting: Family Medicine

## 2011-02-15 VITALS — BP 158/80 | HR 72 | Resp 16 | Ht 61.0 in | Wt 129.4 lb

## 2011-02-15 DIAGNOSIS — E119 Type 2 diabetes mellitus without complications: Secondary | ICD-10-CM

## 2011-02-15 DIAGNOSIS — E785 Hyperlipidemia, unspecified: Secondary | ICD-10-CM

## 2011-02-15 DIAGNOSIS — R5381 Other malaise: Secondary | ICD-10-CM

## 2011-02-15 DIAGNOSIS — E876 Hypokalemia: Secondary | ICD-10-CM

## 2011-02-15 DIAGNOSIS — I10 Essential (primary) hypertension: Secondary | ICD-10-CM

## 2011-02-15 LAB — BASIC METABOLIC PANEL
BUN: 24 mg/dL — ABNORMAL HIGH (ref 6–23)
CO2: 29 mEq/L (ref 19–32)
Chloride: 99 mEq/L (ref 96–112)
Creat: 0.89 mg/dL (ref 0.40–1.20)

## 2011-02-15 MED ORDER — POTASSIUM CHLORIDE CRYS ER 20 MEQ PO TBCR: 20.0000 meq | EXTENDED_RELEASE_TABLET | Freq: Two times a day (BID) | ORAL | Status: DC

## 2011-02-15 MED ORDER — METOPROLOL TARTRATE 50 MG PO TABS: ORAL_TABLET | ORAL | Status: DC

## 2011-02-15 NOTE — Patient Instructions (Addendum)
F/U  July 20 or after  HBA1C and chem 7 JUly 17 or after.NOn fasting  PLS do not eat  too much cake or sweets  Increase potassium to one twice daily.  Increase metoprolol to one twice daily

## 2011-02-16 NOTE — Telephone Encounter (Signed)
Already was taken care of.

## 2011-02-17 ENCOUNTER — Other Ambulatory Visit (INDEPENDENT_AMBULATORY_CARE_PROVIDER_SITE_OTHER): Payer: No Typology Code available for payment source

## 2011-02-17 DIAGNOSIS — I10 Essential (primary) hypertension: Secondary | ICD-10-CM

## 2011-02-17 MED ORDER — METOPROLOL TARTRATE 50 MG PO TABS: ORAL_TABLET | ORAL | Status: DC

## 2011-02-20 ENCOUNTER — Other Ambulatory Visit: Payer: Self-pay | Admitting: Family Medicine

## 2011-02-22 ENCOUNTER — Telehealth (INDEPENDENT_AMBULATORY_CARE_PROVIDER_SITE_OTHER): Payer: No Typology Code available for payment source | Admitting: Family Medicine

## 2011-02-22 DIAGNOSIS — E109 Type 1 diabetes mellitus without complications: Secondary | ICD-10-CM

## 2011-02-22 MED ORDER — LANCETS MISC: Status: DC

## 2011-02-22 NOTE — Telephone Encounter (Signed)
Patient was requesting refill on her Lancets Sent as requested

## 2011-02-26 NOTE — Assessment & Plan Note (Signed)
Improved and controlled, pt does not need insulin

## 2011-02-26 NOTE — Progress Notes (Signed)
  Subjective:    Patient ID: Emily Jennings, female    DOB: 04/16/1934, 77 y.o.   MRN: [id number]  HPI The PT is here for follow up and re-evaluation of chronic medical conditions, medication management and review of recent lab and radiology data.  Preventive health is updated, specifically  Cancer screening, Osteoporosis screening and Immunization.   Questions or concerns regarding consultations or procedures which the PT has had in the interim are  addressed. The PT denies any adverse reactions to current medications since the last visit.  There are no new concerns.  There are no specific complaints  Reports and has meter to show fasting sugars are seldom over 130 on oral med only      Review of Systems Denies recent fever or chills. Denies sinus pressure, nasal congestion, ear pain or sore throat. Denies chest congestion, productive cough or wheezing. Denies chest pains, palpitations, paroxysmal nocturnal dyspnea, orthopnea and leg swelling Denies abdominal pain, nausea, vomiting,diarrhea or constipation.  Denies rectal bleeding or change in bowel movement. Denies dysuria, frequency, hesitancy or incontinence. Denies joint pain, swelling and limitation in mobility. Denies headaches, seizure, numbness, or tingling. Denies depression, anxiety or insomnia. Denies skin break down or rash.        Objective:   Physical Exam Patient alert and oriented and in no Cardiopulmonary distress.  HEENT: No facial asymmetry, EOMI, no sinus tenderness, TM's clear, Oropharynx pink and moist.  Neck supple no adenopathy.  Chest: Clear to auscultation bilaterally.  CVS: S1, S2 no murmurs, no S3.  ABD: Soft non tender. Bowel sounds normal.  Ext: No edema  MS: Adequate ROM spine, shoulders, hips and knees.  Skin: Intact, no ulcerations or rash noted.  Psych: Good eye contact, normal affect. Memory intact not anxious or depressed appearing.  CNS: CN 2-12 intact, power, tone and sensation  normal throughout.        Assessment & Plan:

## 2011-02-26 NOTE — Assessment & Plan Note (Signed)
Low fat diet discussed and encouraged, no management changes

## 2011-02-26 NOTE — Assessment & Plan Note (Addendum)
Inadequately corrected, dose increase on potassium

## 2011-02-26 NOTE — Assessment & Plan Note (Signed)
Improved, though still not at goal, further adjustments to be made

## 2011-02-27 ENCOUNTER — Telehealth: Payer: Self-pay | Admitting: Family Medicine

## 2011-02-27 ENCOUNTER — Encounter: Payer: Self-pay | Admitting: Family Medicine

## 2011-02-27 ENCOUNTER — Ambulatory Visit (INDEPENDENT_AMBULATORY_CARE_PROVIDER_SITE_OTHER): Payer: Medicare Other | Admitting: Family Medicine

## 2011-02-27 ENCOUNTER — Other Ambulatory Visit: Payer: Self-pay | Admitting: Family Medicine

## 2011-02-27 ENCOUNTER — Ambulatory Visit (HOSPITAL_COMMUNITY)
Admission: RE | Admit: 2011-02-27 | Discharge: 2011-02-27 | Disposition: A | Discharge status: Home / Self Care | Payer: Medicare Other | Source: Ambulatory Visit | Attending: Family Medicine | Admitting: Family Medicine

## 2011-02-27 VITALS — BP 160/82 | HR 64 | Resp 16 | Ht 61.0 in | Wt 128.4 lb

## 2011-02-27 DIAGNOSIS — I10 Essential (primary) hypertension: Secondary | ICD-10-CM

## 2011-02-27 DIAGNOSIS — M25522 Pain in left elbow: Secondary | ICD-10-CM

## 2011-02-27 DIAGNOSIS — S6990XA Unspecified injury of unspecified wrist, hand and finger(s), initial encounter: Secondary | ICD-10-CM | POA: Insufficient documentation

## 2011-02-27 DIAGNOSIS — M25529 Pain in unspecified elbow: Secondary | ICD-10-CM

## 2011-02-27 DIAGNOSIS — W19XXXA Unspecified fall, initial encounter: Secondary | ICD-10-CM | POA: Insufficient documentation

## 2011-02-27 DIAGNOSIS — E119 Type 2 diabetes mellitus without complications: Secondary | ICD-10-CM

## 2011-02-27 DIAGNOSIS — S59909A Unspecified injury of unspecified elbow, initial encounter: Secondary | ICD-10-CM | POA: Insufficient documentation

## 2011-02-27 MED ORDER — MELOXICAM 15 MG PO TABS: ORAL_TABLET | ORAL | Status: DC

## 2011-02-27 MED ORDER — AMLODIPINE BESYLATE 10 MG PO TABS: 10.0000 mg | ORAL_TABLET | Freq: Every day | ORAL | Status: DC

## 2011-02-27 NOTE — Patient Instructions (Signed)
cPE as before.  Xray today for elbow pain and med is sent in.  BP is too high, iNCREASE amlodipine 5mg  to tWO tablets once daily till done, then new script is for oNE 10mg  tablet once daily

## 2011-02-27 NOTE — Telephone Encounter (Signed)
Fell one night last week while she was going to the bathroom. Hit her left elbow on the tub and it is much better now but it was hurting really bad Saturday. Still swollen and is sore but not like it was. Wants to know if she can have an xray of it to be sure. Also she is c/o of a knot above her pubic bone and ear. Advised work in for [employer]

## 2011-02-27 NOTE — Progress Notes (Signed)
  Subjective:    Patient ID: Emily Jennings, female    DOB: Jul 06, 1934, 78 y.o.   MRN: [id number]  HPI Pt fell last week Wednesday, hurt right groin, concern about swelling in the area, weight bearing well, but the left elbow that she hit has become increasingly tender is slightly swollen she has full rom of the joint , 3 days ago could not bend it. Otherwise no new complaints Reports fasting sugars are generally less than 130   Review of Systems Denies recent fever or chills. Denies sinus pressure, nasal congestion, ear pain or sore throat. Denies chest congestion, productive cough or wheezing. Denies chest pains, palpitations, paroxysmal nocturnal dyspnea, orthopnea and leg swelling Denies abdominal pain, nausea, vomiting,diarrhea or constipation.  Denies rectal bleeding or change in bowel movement. Denies dysuria, frequency, hesitancy or incontinence.  Denies headaches, seizure, numbness, or tingling. Denies depression,uncontrolled  anxiety or insomnia. Denies skin break down or rash.        Objective:   Physical Exam Patient alert and oriented and in no Cardiopulmonary distress.  HEENT: No facial asymmetry, EOMI, no sinus tenderness, TM's clear, Oropharynx pink and moist.  Neck supple no adenopathy.  Chest: Clear to auscultation bilaterally.  CVS: S1, S2 no murmurs, no S3.  ABD: Soft non tender. Bowel sounds normal.  Ext: No edema  MS: Adequate ROM spine, shoulders, hips and knees.Decrease ROM left elbow with swelling and tenderness  Skin: Intact, no ulcerations or rash noted.  Psych: Good eye contact, normal affect. Memory intact not anxious or depressed appearing.  CNS: CN 2-12 intact, power, tone and sensation normal throughout.        Assessment & Plan:

## 2011-02-27 NOTE — Telephone Encounter (Signed)
Called patient no answer.

## 2011-03-08 ENCOUNTER — Encounter: Payer: Self-pay | Admitting: Orthopedic Surgery

## 2011-03-08 ENCOUNTER — Ambulatory Visit (INDEPENDENT_AMBULATORY_CARE_PROVIDER_SITE_OTHER): Payer: Medicare Other | Admitting: Orthopedic Surgery

## 2011-03-08 VITALS — HR 78 | Ht 61.0 in | Wt 128.0 lb

## 2011-03-08 DIAGNOSIS — S5000XA Contusion of unspecified elbow, initial encounter: Secondary | ICD-10-CM | POA: Insufficient documentation

## 2011-03-08 NOTE — Progress Notes (Signed)
   Chief complaint LEFT elbow pain  The pain is located over the LEFT olecranon, however at this time no pain.  No swelling at this time although she did have some.  No loss of movement.  Review of systems no numbness or tingling in the elbow or upper extremity  General appearance was normal she had a small frame her vital signs were recorded.  She had normal pulse in the ulna and radius artery.  Skin was intact.  No lymphadenopathy.  Reflexes were 2+ bilaterally.  She was awake and alert.  Elbow range of motion is normal on her nontender strength was normal.  X-rays were negative except for what appears to be a bone fleck at the coronoid, clinically no tenderness there  Impression elbow contusion  Follow up as

## 2011-03-08 NOTE — Patient Instructions (Signed)
Take tylenol as needed for pain   Ok to do normal activity

## 2011-03-12 NOTE — Assessment & Plan Note (Signed)
Medication compliance addressed. Commitment to regular exercise and healthy  food choices, with portion control discussed. DASH diet and low fat diet discussed and literature offered. Changes in medication made at this visit.  

## 2011-03-12 NOTE — Assessment & Plan Note (Signed)
Improved for rept HBA1C in July when due

## 2011-04-05 ENCOUNTER — Other Ambulatory Visit: Payer: Self-pay | Admitting: Family Medicine

## 2011-04-05 DIAGNOSIS — C50919 Malignant neoplasm of unspecified site of unspecified female breast: Secondary | ICD-10-CM

## 2011-04-06 ENCOUNTER — Other Ambulatory Visit: Payer: Self-pay

## 2011-04-06 ENCOUNTER — Telehealth: Payer: Self-pay | Admitting: Family Medicine

## 2011-04-06 MED ORDER — ALPRAZOLAM 0.5 MG PO TABS: 0.5000 mg | ORAL_TABLET | Freq: Every evening | ORAL | Status: DC | PRN

## 2011-04-06 NOTE — Progress Notes (Signed)
Refilled as requested  

## 2011-04-07 ENCOUNTER — Telehealth: Payer: Self-pay | Admitting: Family Medicine

## 2011-04-07 ENCOUNTER — Other Ambulatory Visit: Payer: Self-pay

## 2011-04-07 MED ORDER — ALPRAZOLAM 0.5 MG PO TABS: 0.5000 mg | ORAL_TABLET | Freq: Every evening | ORAL | Status: DC | PRN

## 2011-04-07 MED ORDER — POTASSIUM CHLORIDE CRYS ER 20 MEQ PO TBCR: 20.0000 meq | EXTENDED_RELEASE_TABLET | Freq: Two times a day (BID) | ORAL | Status: DC

## 2011-04-07 NOTE — Telephone Encounter (Signed)
Both we sent in if she calls back

## 2011-04-07 NOTE — Telephone Encounter (Signed)
Faxed in

## 2011-04-11 ENCOUNTER — Encounter: Payer: Self-pay | Admitting: Family Medicine

## 2011-04-11 LAB — HEMOGLOBIN A1C
Hgb A1c MFr Bld: 7.3 % — ABNORMAL HIGH (ref <5.7)
Mean Plasma Glucose: 163 mg/dL — ABNORMAL HIGH (ref <117)

## 2011-04-11 LAB — BASIC METABOLIC PANEL
BUN: 33 mg/dL — ABNORMAL HIGH (ref 6–23)
Chloride: 101 mEq/L (ref 96–112)
Creat: 1.13 mg/dL — ABNORMAL HIGH (ref 0.50–1.10)
Glucose, Bld: 111 mg/dL — ABNORMAL HIGH (ref 70–99)
Potassium: 4.5 mEq/L (ref 3.5–5.3)

## 2011-04-13 ENCOUNTER — Ambulatory Visit (INDEPENDENT_AMBULATORY_CARE_PROVIDER_SITE_OTHER): Payer: Medicare Other | Admitting: Family Medicine

## 2011-04-13 ENCOUNTER — Other Ambulatory Visit (HOSPITAL_COMMUNITY)
Admission: RE | Admit: 2011-04-13 | Discharge: 2011-04-13 | Disposition: A | Discharge status: Home / Self Care | Payer: Medicare Other | Source: Ambulatory Visit | Attending: Family Medicine | Admitting: Family Medicine

## 2011-04-13 ENCOUNTER — Encounter: Payer: Self-pay | Admitting: Family Medicine

## 2011-04-13 DIAGNOSIS — Z0000 Encounter for general adult medical examination without abnormal findings: Secondary | ICD-10-CM

## 2011-04-13 DIAGNOSIS — E119 Type 2 diabetes mellitus without complications: Secondary | ICD-10-CM

## 2011-04-13 DIAGNOSIS — Z1211 Encounter for screening for malignant neoplasm of colon: Secondary | ICD-10-CM

## 2011-04-13 DIAGNOSIS — Z01419 Encounter for gynecological examination (general) (routine) without abnormal findings: Secondary | ICD-10-CM | POA: Insufficient documentation

## 2011-04-13 DIAGNOSIS — I10 Essential (primary) hypertension: Secondary | ICD-10-CM

## 2011-04-13 LAB — POC HEMOCCULT BLD/STL (OFFICE/1-CARD/DIAGNOSTIC): Fecal Occult Blood, POC: NEGATIVE

## 2011-04-13 NOTE — Patient Instructions (Addendum)
F/u October 25  Or shortly afwer.  hBA1C and Chem 7 non fasting also microalb oct 23 or after  Your pressure is excellent , and your blood sugar much better, psl cut back on cookies, and sweets, so that your next blood sugar test is even better  No med changes at this time

## 2011-04-13 NOTE — Progress Notes (Signed)
  Subjective:    Patient ID: Emily Jennings, female    DOB: Apr 22, 1934, 77 y.o.   MRN: [id number]  HPI The PT is here for annual exam, and re-evaluation of chronic medical conditions, medication management and review of recent lab and radiology data.  Preventive health is updated, specifically  Cancer screening, and Immunization.   Questions or concerns regarding consultations or procedures which the PT has had in the interim are  addressed. The PT denies any adverse reactions to current medications since the last visit.  There are no new concerns.  There are no specific complaints       Review of Systems Denies recent fever or chills. Denies sinus pressure, nasal congestion, ear pain or sore throat. Denies chest congestion, productive cough or wheezing. Denies chest pains, palpitations, paroxysmal nocturnal dyspnea, orthopnea and leg swelling Denies abdominal pain, nausea, vomiting,diarrhea or constipation.  Denies rectal bleeding or change in bowel movement. Denies dysuria, frequency, hesitancy or incontinence. Denies joint pain, swelling and limitation in mobility. Denies headaches, seizure, numbness, or tingling. Denies depression, anxiety or insomnia. Denies skin break down or rash.        Objective:   Physical Exam Pleasant well nourished female, alert and oriented x 3, in no cardio-pulmonary distress. Afebrile. HEENT No facial trauma or asymetry.  No sinus tenderness EOMI, PERTL, fundoscopic exam is normal, no hemorhage or exudate. External ears normal, tympanic membranes clear. Oropharynx moist, no exudate, good dentition. Neck: supple, no adenopathy,JVD or thyromegaly.No bruits.  Chest: Clear to ascultation bilaterally.No crackles or wheezes. Non tender to palpation  Breast: No asymetry,no masses. No nipple discharge or inversion. No axillary or supraclavicular adenopathy  Cardiovascular system; Heart sounds normal,  S1 and  S2 ,no S3.  No murmur, or  thrill. Apical beat not displaced Peripheral pulses normal.  Abdomen: Soft, non tender, no organomegaly or masses. No bruits. Bowel sounds normal. No guarding, tenderness or rebound.  Rectal:  No mass. guaic negative stool.  GU: External genitalia normal. No lesions. Vaginal canal normal.No discharge. Uterus normal size, no adnexal masses, no cervical motion or adnexal tenderness.  Musculoskeletal exam: Full ROM of spine, hips , shoulders and knees. No deformity ,swelling or crepitus noted. No muscle wasting or atrophy.   Neurologic: Cranial nerves 2 to 12 intact. Power, tone ,sensation and reflexes normal throughout. No disturbance in gait. No tremor.  Skin: Intact, no ulceration, erythema , scaling or rash noted. Pigmentation normal throughout  Psych; Normal mood and affect. Judgement and concentration normal        Assessment & Plan:   No problem-specific assessment & plan notes found for this encounter.

## 2011-04-18 ENCOUNTER — Encounter: Payer: Self-pay | Admitting: *Deleted

## 2011-04-24 ENCOUNTER — Telehealth: Payer: Self-pay | Admitting: Family Medicine

## 2011-04-25 ENCOUNTER — Telehealth: Payer: Self-pay | Admitting: Family Medicine

## 2011-04-25 DIAGNOSIS — E876 Hypokalemia: Secondary | ICD-10-CM

## 2011-04-25 NOTE — Telephone Encounter (Signed)
I have spoke with the patient and have already sent a message to the dr and am waiting for her to respond

## 2011-04-25 NOTE — Telephone Encounter (Signed)
Patient aware and will have lab drawn

## 2011-04-25 NOTE — Telephone Encounter (Signed)
She said the 2 a day is not working. When she wakes up in the morning she has cramps. She thinks her potassium is too low and she needs to increase it. She only has these cramps when she is low so she increased it to 3x a day and now she is out and needs a new rx sent to the pharmacy with the new directions. [facility] [place]

## 2011-04-25 NOTE — Telephone Encounter (Signed)
She needs a chem 7 with her potassium tablets being taken 3 times daily so I can be sure this is safwe before I prescribe , pls order, NOT stat thanks

## 2011-04-27 LAB — BASIC METABOLIC PANEL
Calcium: 9.4 mg/dL (ref 8.4–10.5)
Chloride: 104 mEq/L (ref 96–112)
Creat: 1.31 mg/dL — ABNORMAL HIGH (ref 0.50–1.10)
Sodium: 139 mEq/L (ref 135–145)

## 2011-05-02 ENCOUNTER — Other Ambulatory Visit: Payer: Self-pay | Admitting: Family Medicine

## 2011-05-02 DIAGNOSIS — C50919 Malignant neoplasm of unspecified site of unspecified female breast: Secondary | ICD-10-CM

## 2011-05-03 ENCOUNTER — Ambulatory Visit (HOSPITAL_COMMUNITY)
Admission: RE | Admit: 2011-05-03 | Discharge: 2011-05-03 | Disposition: A | Discharge status: Home / Self Care | Payer: Medicare Other | Source: Ambulatory Visit | Attending: Family Medicine | Admitting: Family Medicine

## 2011-05-03 DIAGNOSIS — C50919 Malignant neoplasm of unspecified site of unspecified female breast: Secondary | ICD-10-CM

## 2011-05-03 DIAGNOSIS — Z853 Personal history of malignant neoplasm of breast: Secondary | ICD-10-CM | POA: Insufficient documentation

## 2011-05-13 ENCOUNTER — Other Ambulatory Visit: Payer: Self-pay | Admitting: Family Medicine

## 2011-05-15 ENCOUNTER — Telehealth: Payer: Self-pay | Admitting: Family Medicine

## 2011-05-15 MED ORDER — LOSARTAN POTASSIUM 100 MG PO TABS: ORAL_TABLET | ORAL | Status: DC

## 2011-05-15 NOTE — Telephone Encounter (Signed)
Med refilled as requested

## 2011-05-19 NOTE — Telephone Encounter (Signed)
Patient aware.

## 2011-05-29 ENCOUNTER — Telehealth: Payer: Self-pay | Admitting: Family Medicine

## 2011-05-29 NOTE — Telephone Encounter (Signed)
Called [facility] and her last fill was 8/15. Ok to authorize 5 days early? She flushed them by mistake while cleaning out her medicine cabinet

## 2011-05-30 ENCOUNTER — Other Ambulatory Visit: Payer: Self-pay

## 2011-05-30 ENCOUNTER — Telehealth: Payer: Self-pay | Admitting: Family Medicine

## 2011-05-30 NOTE — Telephone Encounter (Signed)
Can fill one day only, explain this pls and authorize fill one day early only pls

## 2011-05-30 NOTE — Telephone Encounter (Signed)
Faxed to pharmacy to fill on the 14th. 1 day early only

## 2011-05-31 NOTE — Telephone Encounter (Signed)
CALLED PATIENT, LEFT MESSAGE

## 2011-06-01 NOTE — Telephone Encounter (Signed)
PATIENT AWARE XANAX WILL NOT BE FILLED EARLY, STATES SHE THINKS IT IS DUE FOR A REFILL TODAY, PATIENT WILL CALL PHARMACY

## 2011-06-05 ENCOUNTER — Other Ambulatory Visit: Payer: Self-pay | Admitting: Family Medicine

## 2011-06-16 LAB — COMPREHENSIVE METABOLIC PANEL
ALT: 18
Alkaline Phosphatase: 76
BUN: 17
CO2: 37 — ABNORMAL HIGH
GFR calc non Af Amer: 56 — ABNORMAL LOW
Glucose, Bld: 95
Potassium: 3.3 — ABNORMAL LOW
Sodium: 143
Total Bilirubin: 0.5

## 2011-06-16 LAB — DIFFERENTIAL
Basophils Absolute: 0
Basophils Relative: 1
Eosinophils Absolute: 0.2
Neutrophils Relative %: 66

## 2011-06-16 LAB — CBC
HCT: 37.5
Hemoglobin: 12.7
RBC: 4.09

## 2011-06-16 LAB — PROTIME-INR
INR: 1
Prothrombin Time: 12.8

## 2011-06-20 LAB — DIFFERENTIAL
Basophils Relative: 1
Eosinophils Absolute: 0.2
Lymphs Abs: 1.8
Neutrophils Relative %: 56

## 2011-06-20 LAB — CBC
MCV: 91
Platelets: 225
WBC: 5.9

## 2011-06-20 LAB — BASIC METABOLIC PANEL
BUN: 15
Calcium: 9.9
Chloride: 95 — ABNORMAL LOW
Creatinine, Ser: 0.96
GFR calc non Af Amer: 57 — ABNORMAL LOW

## 2011-06-28 ENCOUNTER — Other Ambulatory Visit: Payer: Self-pay | Admitting: Family Medicine

## 2011-07-07 ENCOUNTER — Other Ambulatory Visit: Payer: Self-pay | Admitting: Family Medicine

## 2011-07-14 ENCOUNTER — Encounter: Payer: Self-pay | Admitting: Family Medicine

## 2011-07-17 ENCOUNTER — Ambulatory Visit (INDEPENDENT_AMBULATORY_CARE_PROVIDER_SITE_OTHER): Payer: Medicare Other | Admitting: Family Medicine

## 2011-07-17 ENCOUNTER — Encounter: Payer: Self-pay | Admitting: Family Medicine

## 2011-07-17 VITALS — BP 120/70 | HR 74 | Resp 16 | Ht 61.5 in | Wt 133.8 lb

## 2011-07-17 DIAGNOSIS — I2510 Atherosclerotic heart disease of native coronary artery without angina pectoris: Secondary | ICD-10-CM

## 2011-07-17 DIAGNOSIS — I10 Essential (primary) hypertension: Secondary | ICD-10-CM

## 2011-07-17 DIAGNOSIS — Z23 Encounter for immunization: Secondary | ICD-10-CM

## 2011-07-17 DIAGNOSIS — E119 Type 2 diabetes mellitus without complications: Secondary | ICD-10-CM

## 2011-07-17 DIAGNOSIS — J309 Allergic rhinitis, unspecified: Secondary | ICD-10-CM

## 2011-07-17 MED ORDER — CETIRIZINE HCL 10 MG PO CHEW: 10.0000 mg | CHEWABLE_TABLET | Freq: Every day | ORAL | Status: DC

## 2011-07-17 MED ORDER — FLUTICASONE PROPIONATE 50 MCG/ACT NA SUSP: 2.0000 | Freq: Every day | NASAL | Status: DC

## 2011-07-17 MED ORDER — CETIRIZINE HCL 10 MG PO TABS: ORAL_TABLET | ORAL | Status: DC

## 2011-07-17 NOTE — Patient Instructions (Addendum)
F/u in 4 months. You will be referred to [facility] cardiology for routine f/u per your request  [place] 3 TO 7 DAYS BEFORE YOUR NEXT [facility]  VISIT.  THIS WILL IMPROVE THE QUALITY OF YOUR CARE. CMP and EGFR, HBA1C  In 4 months, non fasting.  TdAP and flu vaccines  It is important that you exercise regularly at least 30 minutes 5 times a week. If you develop chest pain, have severe difficulty breathing, or feel very tired, stop exercising immediately and seek medical attention  A healthy diet is rich in fruit, vegetables and whole grains. Poultry fish, nuts and beans are a healthy choice for protein rather then red meat. A low sodium diet and drinking 64 ounces of water daily is generally recommended. Oils and sweet should be limited. Carbohydrates especially for those who are diabetic or overweight, should be limited to 30-45 gram per meal. It is important to eat on a regular schedule, at least 3 times daily. Snacks should be primarily fruits, vegetables or nuts.

## 2011-07-17 NOTE — Assessment & Plan Note (Signed)
Pt reports fasting sugars under 100

## 2011-07-17 NOTE — Assessment & Plan Note (Signed)
Controlled, no change in medication  

## 2011-07-17 NOTE — Assessment & Plan Note (Signed)
Uncontrolled 

## 2011-07-18 LAB — HEMOGLOBIN A1C: Mean Plasma Glucose: 143 mg/dL — ABNORMAL HIGH (ref <117)

## 2011-07-18 LAB — MICROALBUMIN / CREATININE URINE RATIO
Microalb Creat Ratio: 162.2 mg/g — ABNORMAL HIGH (ref 0.0–30.0)
Microalb, Ur: 5.06 mg/dL — ABNORMAL HIGH (ref 0.00–1.89)

## 2011-07-18 LAB — BASIC METABOLIC PANEL
CO2: 29 mEq/L (ref 19–32)
Calcium: 9.8 mg/dL (ref 8.4–10.5)
Glucose, Bld: 88 mg/dL (ref 70–99)
Potassium: 4.8 mEq/L (ref 3.5–5.3)
Sodium: 137 mEq/L (ref 135–145)

## 2011-07-23 NOTE — Progress Notes (Signed)
  Subjective:    Patient ID: Emily Jennings, female    DOB: 08-03-1934, 77 y.o.   MRN: [id number]  HPI The PT is here for follow up and re-evaluation of chronic medical conditions, medication management and review of any available recent lab and radiology data.  Preventive health is updated, specifically  Cancer screening and Immunization.   Questions or concerns regarding consultations or procedures which the PT has had in the interim are  addressed. The PT denies any adverse reactions to current medications since the last visit.  There are no new concerns.  There are no specific complaints Fasting blood sugars are checked at least 5 times per week, and are seldom over 130     Review of Systems See HPI Denies recent fever or chills. Denies sinus pressure, nasal congestion, ear pain or sore throat. Denies chest congestion, productive cough or wheezing. Denies chest pains, palpitations and leg swelling Denies abdominal pain, nausea, vomiting,diarrhea or constipation.   Denies dysuria, frequency, hesitancy or incontinence. Denies joint pain, swelling and limitation in mobility. Denies headaches, seizures, numbness, or tingling. Denies depression, anxiety or insomnia. Denies skin break down or rash.        Objective:   Physical Exam Patient alert and oriented and in no cardiopulmonary distress.  HEENT: No facial asymmetry, EOMI, no sinus tenderness,  oropharynx pink and moist.  Neck supple no adenopathy.  Chest: Clear to auscultation bilaterally.  CVS: S1, S2 no murmurs, no S3.  ABD: Soft non tender. Bowel sounds normal.  Ext: No edema  MS: Adequate ROM spine, shoulders, hips and knees.  Skin: Intact, no ulcerations or rash noted.  Psych: Good eye contact, normal affect. Memory intact not anxious or depressed appearing.  CNS: CN 2-12 intact, power, tone and sensation normal throughout. Diabetic Foot Check:  Appearance - no lesions, ulcers or calluses Skin - no unusual  pallor or redness Sensation - grossly intact to light touch Monofilament testing -  Right - Great toe, medial, central, lateral ball and posterior foot intact Left - Great toe, medial, central, lateral ball and posterior foot intact Pulses Left - Dorsalis Pedis and Posterior Tibia normal Right - Dorsalis Pedis and Posterior Tibia normal        Assessment & Plan:

## 2011-07-23 NOTE — Assessment & Plan Note (Signed)
Pt hs been followed in the past by cardiology, and is requesting re evaluation Though she has risk factors she  is asymptomatic of active cad at this time. She will be referred per her request

## 2011-08-17 ENCOUNTER — Other Ambulatory Visit: Payer: Self-pay | Admitting: Family Medicine

## 2011-08-27 ENCOUNTER — Other Ambulatory Visit: Payer: Self-pay | Admitting: Family Medicine

## 2011-09-07 ENCOUNTER — Telehealth: Payer: Self-pay | Admitting: Family Medicine

## 2011-09-07 NOTE — Telephone Encounter (Signed)
Called patient left message

## 2011-09-07 NOTE — Telephone Encounter (Signed)
Called patient no answer.

## 2011-09-08 ENCOUNTER — Other Ambulatory Visit: Payer: Self-pay

## 2011-09-08 MED ORDER — LORATADINE 10 MG PO TABS: 10.0000 mg | ORAL_TABLET | Freq: Every day | ORAL | Status: DC

## 2011-09-08 NOTE — Progress Notes (Signed)
Not feeling bad, just has runny nose. Advised urgent care if she felt worse

## 2011-09-13 ENCOUNTER — Other Ambulatory Visit: Payer: Self-pay | Admitting: Family Medicine

## 2011-09-25 ENCOUNTER — Telehealth: Payer: Self-pay | Admitting: Family Medicine

## 2011-09-27 NOTE — Telephone Encounter (Signed)
Spoke with pt and went over different blood pressure meds.  Called [facility] and authorized refill on Aldactone because pt has misplaced bottle.

## 2011-10-09 ENCOUNTER — Other Ambulatory Visit: Payer: Self-pay | Admitting: Family Medicine

## 2011-11-01 DIAGNOSIS — M810 Age-related osteoporosis without current pathological fracture: Secondary | ICD-10-CM | POA: Diagnosis not present

## 2011-11-01 DIAGNOSIS — C50919 Malignant neoplasm of unspecified site of unspecified female breast: Secondary | ICD-10-CM | POA: Diagnosis not present

## 2011-11-01 DIAGNOSIS — Z79810 Long term (current) use of selective estrogen receptor modulators (SERMs): Secondary | ICD-10-CM | POA: Diagnosis not present

## 2011-11-01 DIAGNOSIS — Z79899 Other long term (current) drug therapy: Secondary | ICD-10-CM | POA: Diagnosis not present

## 2011-11-01 DIAGNOSIS — E119 Type 2 diabetes mellitus without complications: Secondary | ICD-10-CM | POA: Diagnosis not present

## 2011-11-14 DIAGNOSIS — Z01419 Encounter for gynecological examination (general) (routine) without abnormal findings: Secondary | ICD-10-CM | POA: Diagnosis not present

## 2011-11-14 DIAGNOSIS — N852 Hypertrophy of uterus: Secondary | ICD-10-CM | POA: Diagnosis not present

## 2011-11-14 DIAGNOSIS — C50919 Malignant neoplasm of unspecified site of unspecified female breast: Secondary | ICD-10-CM | POA: Diagnosis not present

## 2011-11-16 ENCOUNTER — Ambulatory Visit (INDEPENDENT_AMBULATORY_CARE_PROVIDER_SITE_OTHER): Payer: Medicare Other | Admitting: Family Medicine

## 2011-11-16 ENCOUNTER — Other Ambulatory Visit: Payer: Self-pay | Admitting: Family Medicine

## 2011-11-16 ENCOUNTER — Encounter: Payer: Self-pay | Admitting: Family Medicine

## 2011-11-16 VITALS — BP 140/78 | Ht 61.5 in | Wt 131.0 lb

## 2011-11-16 DIAGNOSIS — J309 Allergic rhinitis, unspecified: Secondary | ICD-10-CM

## 2011-11-16 DIAGNOSIS — E785 Hyperlipidemia, unspecified: Secondary | ICD-10-CM

## 2011-11-16 DIAGNOSIS — J019 Acute sinusitis, unspecified: Secondary | ICD-10-CM | POA: Diagnosis not present

## 2011-11-16 DIAGNOSIS — I10 Essential (primary) hypertension: Secondary | ICD-10-CM

## 2011-11-16 DIAGNOSIS — G4700 Insomnia, unspecified: Secondary | ICD-10-CM | POA: Diagnosis not present

## 2011-11-16 DIAGNOSIS — R5381 Other malaise: Secondary | ICD-10-CM

## 2011-11-16 DIAGNOSIS — R5383 Other fatigue: Secondary | ICD-10-CM

## 2011-11-16 DIAGNOSIS — E119 Type 2 diabetes mellitus without complications: Secondary | ICD-10-CM

## 2011-11-16 HISTORY — DX: Insomnia, unspecified: G47.00

## 2011-11-16 MED ORDER — ALPRAZOLAM 0.5 MG PO TABS: ORAL_TABLET | ORAL | Status: DC

## 2011-11-16 MED ORDER — MELATONIN 2.5 MG PO CAPS: 1.0000 | ORAL_CAPSULE | Freq: Every day | ORAL | Status: DC

## 2011-11-16 MED ORDER — LORATADINE 10 MG PO TABS: 10.0000 mg | ORAL_TABLET | Freq: Every day | ORAL | Status: DC

## 2011-11-16 MED ORDER — PENICILLIN V POTASSIUM 500 MG PO TABS: 500.0000 mg | ORAL_TABLET | Freq: Three times a day (TID) | ORAL | Status: AC

## 2011-11-16 MED ORDER — METHYLPREDNISOLONE ACETATE 40 MG/ML IJ SUSP
40.0000 mg | Freq: Once | INTRAMUSCULAR | Status: AC
  Administered 2011-11-16: 40 mg via INTRAMUSCULAR

## 2011-11-16 MED ORDER — CEFTRIAXONE SODIUM 500 MG IJ SOLR
500.0000 mg | Freq: Once | INTRAMUSCULAR | Status: AC
  Administered 2011-11-16: 500 mg via INTRAMUSCULAR

## 2011-11-16 MED ORDER — POTASSIUM CHLORIDE CRYS ER 20 MEQ PO TBCR: EXTENDED_RELEASE_TABLET | ORAL | Status: DC

## 2011-11-16 NOTE — Progress Notes (Signed)
  Subjective:    Patient ID: Emily Jennings, female    DOB: [birth year], 78 y.o.   MRN: [id number]  HPI The PT is here for follow up and re-evaluation of chronic medical conditions, medication management and review of any available recent lab and radiology data.  Preventive health is updated, specifically  Cancer screening and Immunization.   Questions or concerns regarding consultations or procedures which the PT has had in the interim are  addressed. The PT denies any adverse reactions to current medications since the last visit.  2 week h/o sinus pressure, foul tasting green drainage, excessive nasal congestion sore throat with a tickle and increased non productive cough. She has had intermittent chills and low grade fever Denies polyuria , polydipsia, blurred vision or hypoglycemic episodes       Review of Systems See HPI Denies chest congestion, productive cough or wheezing. Denies chest pains, palpitations and leg swelling Denies abdominal pain, nausea, vomiting,diarrhea or constipation.   Denies dysuria, frequency, hesitancy or incontinence. Denies joint pain, swelling and limitation in mobility. Denies headaches, seizures, numbness, or tingling. Denies depression, anxiety or insomnia. Denies skin break down or rash.        Objective:   Physical Exam Patient alert and oriented and in no cardiopulmonary distress.  HEENT: No facial asymmetry, EOMI, frontal and maxillary  sinus tenderness,  oropharynx pink and moist.  Neck supple , bilateral anterior cervical adenopathy.  Chest: Clear to auscultation bilaterally.  CVS: S1, S2 no murmurs, no S3.  ABD: Soft non tender. Bowel sounds normal.  Ext: No edema  MS: Adequate ROM spine, shoulders, hips and knees.  Skin: Intact, no ulcerations or rash noted.  Psych: Good eye contact, normal affect. Memory intact not anxious or depressed appearing.  CNS: CN 2-12 intact, power, tone and sensation normal throughout.          Assessment & Plan:

## 2011-11-16 NOTE — Patient Instructions (Addendum)
F/u in 4.5 month  You are receiving rocephin 500mg  and depo medrol 40 mg today for sinusitis and uncontrolled allergies  Penicillin is sent to pharmacy  Start melatonin at night one tablet to help with sleep  hBA1c and chem 7 today Fasting labs prior to return visit, cbc, lipid , chem 7 hBA1C , tSh

## 2011-11-17 LAB — BASIC METABOLIC PANEL
CO2: 28 mEq/L (ref 19–32)
Calcium: 10.4 mg/dL (ref 8.4–10.5)
Sodium: 143 mEq/L (ref 135–145)

## 2011-11-17 LAB — HEMOGLOBIN A1C
Hgb A1c MFr Bld: 6.5 % — ABNORMAL HIGH (ref <5.7)
Mean Plasma Glucose: 140 mg/dL — ABNORMAL HIGH (ref <117)

## 2011-11-17 NOTE — Assessment & Plan Note (Signed)
Increased symptoms, daily use of medication stressed and depo medrol administered

## 2011-11-17 NOTE — Assessment & Plan Note (Signed)
Acute infection treat with rocephin and oral antibiotics

## 2011-11-17 NOTE — Assessment & Plan Note (Signed)
Adequate  Control, no med change, though still suboptimal

## 2011-11-17 NOTE — Assessment & Plan Note (Signed)
Controlled, reduce metformin to one daily and focus on diet

## 2011-11-17 NOTE — Progress Notes (Signed)
Addended by: Eual Fines on: 11/17/2011 03:42 PM   Modules accepted: Orders

## 2011-12-01 ENCOUNTER — Other Ambulatory Visit: Payer: Self-pay | Admitting: Family Medicine

## 2011-12-04 DIAGNOSIS — N83209 Unspecified ovarian cyst, unspecified side: Secondary | ICD-10-CM | POA: Diagnosis not present

## 2011-12-04 DIAGNOSIS — C50919 Malignant neoplasm of unspecified site of unspecified female breast: Secondary | ICD-10-CM | POA: Diagnosis not present

## 2011-12-04 DIAGNOSIS — D251 Intramural leiomyoma of uterus: Secondary | ICD-10-CM | POA: Diagnosis not present

## 2011-12-04 DIAGNOSIS — N852 Hypertrophy of uterus: Secondary | ICD-10-CM | POA: Diagnosis not present

## 2012-01-08 ENCOUNTER — Other Ambulatory Visit: Payer: Self-pay | Admitting: Family Medicine

## 2012-01-22 ENCOUNTER — Other Ambulatory Visit: Payer: Self-pay | Admitting: Family Medicine

## 2012-02-01 ENCOUNTER — Encounter: Payer: Self-pay | Admitting: Family Medicine

## 2012-02-05 ENCOUNTER — Telehealth: Payer: Self-pay | Admitting: Family Medicine

## 2012-02-05 ENCOUNTER — Other Ambulatory Visit: Payer: Self-pay | Admitting: Family Medicine

## 2012-02-05 MED ORDER — GLUCOSE BLOOD VI STRP: ORAL_STRIP | Status: DC

## 2012-02-05 NOTE — Telephone Encounter (Signed)
Was sent in on 5/6. Resent in again today

## 2012-02-13 ENCOUNTER — Other Ambulatory Visit: Payer: Self-pay | Admitting: Family Medicine

## 2012-02-14 DIAGNOSIS — E119 Type 2 diabetes mellitus without complications: Secondary | ICD-10-CM | POA: Diagnosis not present

## 2012-02-14 DIAGNOSIS — I10 Essential (primary) hypertension: Secondary | ICD-10-CM | POA: Diagnosis not present

## 2012-02-26 ENCOUNTER — Other Ambulatory Visit: Payer: Self-pay | Admitting: Family Medicine

## 2012-02-29 ENCOUNTER — Other Ambulatory Visit: Payer: Self-pay

## 2012-02-29 DIAGNOSIS — M25522 Pain in left elbow: Secondary | ICD-10-CM

## 2012-03-18 ENCOUNTER — Other Ambulatory Visit: Payer: Self-pay

## 2012-03-18 ENCOUNTER — Telehealth: Payer: Self-pay | Admitting: Family Medicine

## 2012-03-18 DIAGNOSIS — H2510 Age-related nuclear cataract, unspecified eye: Secondary | ICD-10-CM | POA: Diagnosis not present

## 2012-03-18 DIAGNOSIS — H04129 Dry eye syndrome of unspecified lacrimal gland: Secondary | ICD-10-CM | POA: Diagnosis not present

## 2012-03-18 DIAGNOSIS — M3500 Sicca syndrome, unspecified: Secondary | ICD-10-CM | POA: Diagnosis not present

## 2012-03-18 MED ORDER — ALPRAZOLAM 0.5 MG PO TABS: ORAL_TABLET | ORAL | Status: DC

## 2012-03-18 NOTE — Telephone Encounter (Signed)
Refilled

## 2012-03-28 ENCOUNTER — Encounter: Payer: Self-pay | Admitting: Family Medicine

## 2012-03-28 ENCOUNTER — Ambulatory Visit (INDEPENDENT_AMBULATORY_CARE_PROVIDER_SITE_OTHER): Payer: Medicare Other | Admitting: Family Medicine

## 2012-03-28 VITALS — BP 134/72 | HR 70 | Resp 18 | Ht 61.5 in | Wt 130.0 lb

## 2012-03-28 DIAGNOSIS — Z0000 Encounter for general adult medical examination without abnormal findings: Secondary | ICD-10-CM

## 2012-03-28 DIAGNOSIS — E1165 Type 2 diabetes mellitus with hyperglycemia: Secondary | ICD-10-CM

## 2012-03-28 DIAGNOSIS — E119 Type 2 diabetes mellitus without complications: Secondary | ICD-10-CM | POA: Diagnosis not present

## 2012-03-28 DIAGNOSIS — N058 Unspecified nephritic syndrome with other morphologic changes: Secondary | ICD-10-CM

## 2012-03-28 DIAGNOSIS — E1129 Type 2 diabetes mellitus with other diabetic kidney complication: Secondary | ICD-10-CM

## 2012-03-28 DIAGNOSIS — F411 Generalized anxiety disorder: Secondary | ICD-10-CM

## 2012-03-28 DIAGNOSIS — IMO0002 Reserved for concepts with insufficient information to code with codable children: Secondary | ICD-10-CM

## 2012-03-28 DIAGNOSIS — D0590 Unspecified type of carcinoma in situ of unspecified breast: Secondary | ICD-10-CM

## 2012-03-28 DIAGNOSIS — I10 Essential (primary) hypertension: Secondary | ICD-10-CM

## 2012-03-28 DIAGNOSIS — E785 Hyperlipidemia, unspecified: Secondary | ICD-10-CM

## 2012-03-28 MED ORDER — BUSPIRONE HCL 5 MG PO TABS: 5.0000 mg | ORAL_TABLET | Freq: Two times a day (BID) | ORAL | Status: DC

## 2012-03-28 NOTE — Patient Instructions (Addendum)
F/u in 3.5 month with rectal  HBA1C in 4 month   New medication for anxiety Buspar TWICE daily  Start regular exercise 30 minutes 5 days per week, this is important for your health  Healthy diet involves a lot of fresh fruit and vegetable. Drink water aim for 60 ounces daily  Blood pressure is excellent

## 2012-03-29 LAB — HEMOGLOBIN A1C
Hgb A1c MFr Bld: 8.8 % — ABNORMAL HIGH (ref <5.7)
Mean Plasma Glucose: 206 mg/dL — ABNORMAL HIGH (ref <117)

## 2012-03-29 LAB — COMPLETE METABOLIC PANEL WITH GFR
ALT: 11 U/L (ref 0–35)
AST: 19 U/L (ref 0–37)
Alkaline Phosphatase: 53 U/L (ref 39–117)
Calcium: 10.1 mg/dL (ref 8.4–10.5)
GFR, Est African American: 47 mL/min — ABNORMAL LOW
GFR, Est Non African American: 41 mL/min — ABNORMAL LOW
Glucose, Bld: 166 mg/dL — ABNORMAL HIGH (ref 70–99)
Potassium: 4.6 mEq/L (ref 3.5–5.3)
Sodium: 133 mEq/L — ABNORMAL LOW (ref 135–145)

## 2012-03-29 MED ORDER — GLIPIZIDE ER 5 MG PO TB24: 5.0000 mg | ORAL_TABLET | Freq: Every day | ORAL | Status: DC

## 2012-03-29 MED ORDER — METFORMIN HCL 500 MG PO TABS: 500.0000 mg | ORAL_TABLET | Freq: Three times a day (TID) | ORAL | Status: DC

## 2012-03-31 ENCOUNTER — Encounter: Payer: Self-pay | Admitting: Family Medicine

## 2012-03-31 DIAGNOSIS — Z0000 Encounter for general adult medical examination without abnormal findings: Secondary | ICD-10-CM | POA: Insufficient documentation

## 2012-03-31 NOTE — Progress Notes (Signed)
Subjective:    Patient ID: Emily Jennings, female    DOB: 1933/10/17, 78 y.o.   MRN: [id number]  HPI    Review of Systems     Objective:   Physical Exam        Assessment & Plan:  Preventive Screening-Counseling & Management   Patient present here today for a Medicare annual wellness visit.   Current Problems (verified)   Medications Prior to Visit Allergies (verified)   PAST HISTORY  Family History  Social History    Risk Factors  Current exercise habits: 2 to 3 times per week, approx 30 mins each time. Encouraged increased exercise with a 5 day minimum commitment   Dietary issues discussed:low carb, no concentrated sweets, low fat diet also low sodium  Cardiac risk factors: hypertension, diabetes and hyperlipidemia  Depression Screen  (Note: if answer to either of the following is "Yes", a more complete depression screening is indicated)   Over the past two weeks, have you felt down, depressed or hopeless? No  Over the past two weeks, have you felt little interest or pleasure in doing things? No  Have you lost interest or pleasure in daily life? No  Do you often feel hopeless? No  Do you cry easily over simple problems? No   Activities of Daily Living  In your present state of health, do you have any difficulty performing the following activities?  Driving?: No Managing money?: No Feeding yourself?:No Getting from bed to chair?:No Climbing a flight of stairs?:No Preparing food and eating?:No Bathing or showering?:No Getting dressed?:No Getting to the toilet?:No Using the toilet?:No Moving around from place to place?: No  Fall Risk Assessment In the past year have you fallen or had a near fall?:No Are you currently taking any medications that make you dizzy? yes   Hearing Difficulties: No Do you often ask people to speak up or repeat themselves?:No Do you experience ringing or noises in your ears?:No Do you have difficulty understanding soft or  whispered voices?sometimes  Cognitive Testing  Alert? Yes Normal Appearance?Yes  Oriented to person? Yes Place? Yes  Time? Yes  Displays appropriate judgment?Yes  Can read the correct time from a watch face? yes Are you having problems remembering things?No  Advanced Directives have been discussed with the patient?Yes    List the Names of Other Physician/Practitioners you currently use:    Indicate any recent Medical Services you may have received from other than [facility] providers in the past year (date may be approximate).   Assessment:    Annual Wellness Exam   Plan:    During the course of the visit the patient was educated and counseled about appropriate screening and preventive services including:  A healthy diet is rich in fruit, vegetables and whole grains. Poultry fish, nuts and beans are a healthy choice for protein rather then red meat. A low sodium diet and drinking 64 ounces of water daily is generally recommended. Oils and sweet should be limited. Carbohydrates especially for those who are diabetic or overweight, should be limited to 30-45 gram per meal. It is important to eat on a regular schedule, at least 3 times daily. Snacks should be primarily fruits, vegetables or nuts. It is important that you exercise regularly at least 30 minutes 5 times a week. If you develop chest pain, have severe difficulty breathing, or feel very tired, stop exercising immediately and seek medical attention  Immunization reviewed and updated. Cancer screening reviewed and updated    Patient Instructions (  the written plan) was given to the patient.  Medicare Attestation  I have personally reviewed:  The patient's medical and social history  Their use of alcohol, tobacco or illicit drugs  Their current medications and supplements  The patient's functional ability including ADLs,fall risks, home safety risks, cognitive, and hearing and visual impairment  Diet and physical activities    Evidence for depression or mood disorders  The patient's weight, height, BMI, and visual acuity have been recorded in the chart. I have made referrals, counseling, and provided education to the patient based on review of the above and I have provided the patient with a written personalized care plan for preventive services.

## 2012-03-31 NOTE — Assessment & Plan Note (Signed)
Annual wellness exam done at this visit. History reviewed, current level of functioning, and recommendations for improved quality of life as well as any deficits in care addressed. The issue of advanced directives was initiated at the visit also

## 2012-03-31 NOTE — Assessment & Plan Note (Signed)
Deteriorated dose increase on medication, pt to call if blood sugars remain elevated

## 2012-03-31 NOTE — Assessment & Plan Note (Signed)
Followed by oncology 

## 2012-03-31 NOTE — Assessment & Plan Note (Signed)
Controlled, no change in medication  

## 2012-03-31 NOTE — Assessment & Plan Note (Signed)
Uncontrolled, add buspar

## 2012-04-10 ENCOUNTER — Other Ambulatory Visit: Payer: Self-pay | Admitting: Family Medicine

## 2012-04-15 ENCOUNTER — Other Ambulatory Visit: Payer: Self-pay

## 2012-04-15 MED ORDER — ALPRAZOLAM 0.5 MG PO TABS: ORAL_TABLET | ORAL | Status: DC

## 2012-05-06 ENCOUNTER — Other Ambulatory Visit: Payer: Self-pay | Admitting: Family Medicine

## 2012-06-05 ENCOUNTER — Encounter (HOSPITAL_COMMUNITY): Payer: Medicare Other

## 2012-06-05 ENCOUNTER — Ambulatory Visit (HOSPITAL_COMMUNITY)
Admission: RE | Admit: 2012-06-05 | Discharge: 2012-06-05 | Disposition: A | Discharge status: Home / Self Care | Payer: Medicare Other | Source: Ambulatory Visit | Attending: Family Medicine | Admitting: Family Medicine

## 2012-06-05 DIAGNOSIS — Z853 Personal history of malignant neoplasm of breast: Secondary | ICD-10-CM | POA: Diagnosis not present

## 2012-06-05 DIAGNOSIS — R922 Inconclusive mammogram: Secondary | ICD-10-CM | POA: Diagnosis not present

## 2012-06-05 DIAGNOSIS — C50919 Malignant neoplasm of unspecified site of unspecified female breast: Secondary | ICD-10-CM

## 2012-07-08 ENCOUNTER — Other Ambulatory Visit: Payer: Self-pay | Admitting: Family Medicine

## 2012-07-12 ENCOUNTER — Other Ambulatory Visit: Payer: Self-pay

## 2012-07-12 ENCOUNTER — Telehealth: Payer: Self-pay | Admitting: Family Medicine

## 2012-07-12 MED ORDER — ALPRAZOLAM 0.5 MG PO TABS: ORAL_TABLET | ORAL | Status: DC

## 2012-07-12 NOTE — Telephone Encounter (Signed)
Printed for Dr to sign

## 2012-07-29 ENCOUNTER — Encounter: Payer: Self-pay | Admitting: Family Medicine

## 2012-07-29 ENCOUNTER — Ambulatory Visit (INDEPENDENT_AMBULATORY_CARE_PROVIDER_SITE_OTHER): Payer: Medicare Other | Admitting: Family Medicine

## 2012-07-29 VITALS — BP 118/60 | HR 79 | Temp 98.2°F | Resp 15 | Ht 61.5 in | Wt 127.0 lb

## 2012-07-29 DIAGNOSIS — J309 Allergic rhinitis, unspecified: Secondary | ICD-10-CM | POA: Diagnosis not present

## 2012-07-29 DIAGNOSIS — R5381 Other malaise: Secondary | ICD-10-CM

## 2012-07-29 DIAGNOSIS — IMO0002 Reserved for concepts with insufficient information to code with codable children: Secondary | ICD-10-CM

## 2012-07-29 DIAGNOSIS — J329 Chronic sinusitis, unspecified: Secondary | ICD-10-CM | POA: Insufficient documentation

## 2012-07-29 DIAGNOSIS — N058 Unspecified nephritic syndrome with other morphologic changes: Secondary | ICD-10-CM | POA: Diagnosis not present

## 2012-07-29 DIAGNOSIS — E785 Hyperlipidemia, unspecified: Secondary | ICD-10-CM | POA: Diagnosis not present

## 2012-07-29 DIAGNOSIS — R5383 Other fatigue: Secondary | ICD-10-CM | POA: Diagnosis not present

## 2012-07-29 DIAGNOSIS — E1129 Type 2 diabetes mellitus with other diabetic kidney complication: Secondary | ICD-10-CM | POA: Diagnosis not present

## 2012-07-29 DIAGNOSIS — E1165 Type 2 diabetes mellitus with hyperglycemia: Secondary | ICD-10-CM

## 2012-07-29 DIAGNOSIS — J019 Acute sinusitis, unspecified: Secondary | ICD-10-CM | POA: Diagnosis not present

## 2012-07-29 DIAGNOSIS — I10 Essential (primary) hypertension: Secondary | ICD-10-CM

## 2012-07-29 MED ORDER — METHYLPREDNISOLONE ACETATE 40 MG/ML IJ SUSP
40.0000 mg | Freq: Once | INTRAMUSCULAR | Status: AC
  Administered 2012-07-29: 40 mg via INTRAMUSCULAR

## 2012-07-29 MED ORDER — FLUTICASONE PROPIONATE 50 MCG/ACT NA SUSP: 2.0000 | Freq: Every day | NASAL | Status: DC

## 2012-07-29 MED ORDER — PENICILLIN V POTASSIUM 500 MG PO TABS: 500.0000 mg | ORAL_TABLET | Freq: Two times a day (BID) | ORAL | Status: DC

## 2012-07-29 NOTE — Patient Instructions (Addendum)
Sinus infection Take the antibiotics Shot given Use the nose spray- Flonase Come in the morning for your fasting labs  Keep appt for Wed

## 2012-07-29 NOTE — Assessment & Plan Note (Signed)
She has baseline allergies as well, advised to restart flonase for the rhinitis, continue loratadine

## 2012-07-29 NOTE — Progress Notes (Signed)
  Subjective:    Patient ID: Emily Jennings, female    DOB: [birth year], 78 y.o.   MRN: [id number]  HPI Sinus drainage and pressure for the past 3 weeks. She also has chills and feels fatigued and has been sneezing a lot. Denies any sick contacts. Minimal  Cough non productive. Has not taken any over-the-counter medications.Feels a little off balance with the congestion and pressure in her ears She is asking for antibiotics and a shot which typically helps her symptoms. Denies nausea vomiting had one episode of loose stools otherwise normal bowels   Review of Systems  GEN- denies fatigue, fever, weight loss,weakness, recent illness HEENT- denies eye drainage, change in vision, +nasal discharge, CVS- denies chest pain, palpitations RESP- denies SOB,+ cough, wheeze ABD- denies N/V, change in stools, abd pain GU- denies dysuria, hematuria, dribbling, incontinence MSK- denies joint pain, muscle aches, injury Neuro- denies headache, dizziness, syncope, seizure activity      Objective:   Physical Exam GEN- NAD, alert and oriented x3, non toxic appearing HEENT- PERRL, EOMI, non injected sclera, pink conjunctiva, MMM, oropharynx mild injection, TM clear bilat no effusion, Right TM obscured by wax + maxillary sinus tenderness, inflammed turbinates,  Nasal drainage  Neck- Supple, no LAD CVS- RRR, no murmur RESP-CTAB ABS-NABS,soft,NT,ND EXT- No edema Pulses- Radial 2+ Neuro- No focal deficits, CNII-XII in tact          Assessment & Plan:

## 2012-07-29 NOTE — Assessment & Plan Note (Signed)
Treat with antibiotics, depo medrol 40mg  IM given

## 2012-07-29 NOTE — Addendum Note (Signed)
Addended by: Eual Fines on: 07/29/2012 01:29 PM   Modules accepted: Orders

## 2012-07-30 ENCOUNTER — Ambulatory Visit: Payer: Medicare Other | Admitting: Family Medicine

## 2012-07-30 DIAGNOSIS — N058 Unspecified nephritic syndrome with other morphologic changes: Secondary | ICD-10-CM | POA: Diagnosis not present

## 2012-07-30 DIAGNOSIS — R5381 Other malaise: Secondary | ICD-10-CM | POA: Diagnosis not present

## 2012-07-30 DIAGNOSIS — E785 Hyperlipidemia, unspecified: Secondary | ICD-10-CM | POA: Diagnosis not present

## 2012-07-30 DIAGNOSIS — E1129 Type 2 diabetes mellitus with other diabetic kidney complication: Secondary | ICD-10-CM | POA: Diagnosis not present

## 2012-07-30 LAB — COMPLETE METABOLIC PANEL WITH GFR
Albumin: 4.4 g/dL (ref 3.5–5.2)
BUN: 30 mg/dL — ABNORMAL HIGH (ref 6–23)
CO2: 24 mEq/L (ref 19–32)
Calcium: 9.9 mg/dL (ref 8.4–10.5)
Chloride: 106 mEq/L (ref 96–112)
GFR, Est African American: 46 mL/min — ABNORMAL LOW
GFR, Est Non African American: 40 mL/min — ABNORMAL LOW
Glucose, Bld: 116 mg/dL — ABNORMAL HIGH (ref 70–99)
Potassium: 5.4 mEq/L — ABNORMAL HIGH (ref 3.5–5.3)

## 2012-07-30 LAB — CBC WITH DIFFERENTIAL/PLATELET
Basophils Relative: 1 % (ref 0–1)
Eosinophils Absolute: 0.3 10*3/uL (ref 0.0–0.7)
Lymphs Abs: 1.8 10*3/uL (ref 0.7–4.0)
MCH: 29.9 pg (ref 26.0–34.0)
Neutrophils Relative %: 63 % (ref 43–77)
Platelets: 287 10*3/uL (ref 150–400)
RBC: 4.25 MIL/uL (ref 3.87–5.11)

## 2012-07-30 LAB — LIPID PANEL
Cholesterol: 159 mg/dL (ref 0–200)
Total CHOL/HDL Ratio: 2.4 Ratio

## 2012-07-31 ENCOUNTER — Ambulatory Visit (INDEPENDENT_AMBULATORY_CARE_PROVIDER_SITE_OTHER): Payer: Medicare Other | Admitting: Family Medicine

## 2012-07-31 ENCOUNTER — Other Ambulatory Visit: Payer: Self-pay | Admitting: Family Medicine

## 2012-07-31 ENCOUNTER — Encounter: Payer: Self-pay | Admitting: Family Medicine

## 2012-07-31 VITALS — BP 126/70 | HR 74 | Resp 18 | Ht 61.5 in | Wt 128.1 lb

## 2012-07-31 DIAGNOSIS — F411 Generalized anxiety disorder: Secondary | ICD-10-CM

## 2012-07-31 DIAGNOSIS — G4700 Insomnia, unspecified: Secondary | ICD-10-CM

## 2012-07-31 DIAGNOSIS — L259 Unspecified contact dermatitis, unspecified cause: Secondary | ICD-10-CM | POA: Diagnosis not present

## 2012-07-31 DIAGNOSIS — I10 Essential (primary) hypertension: Secondary | ICD-10-CM

## 2012-07-31 DIAGNOSIS — L309 Dermatitis, unspecified: Secondary | ICD-10-CM

## 2012-07-31 DIAGNOSIS — N058 Unspecified nephritic syndrome with other morphologic changes: Secondary | ICD-10-CM | POA: Diagnosis not present

## 2012-07-31 DIAGNOSIS — J019 Acute sinusitis, unspecified: Secondary | ICD-10-CM

## 2012-07-31 DIAGNOSIS — E1129 Type 2 diabetes mellitus with other diabetic kidney complication: Secondary | ICD-10-CM | POA: Diagnosis not present

## 2012-07-31 DIAGNOSIS — D0590 Unspecified type of carcinoma in situ of unspecified breast: Secondary | ICD-10-CM

## 2012-07-31 DIAGNOSIS — J309 Allergic rhinitis, unspecified: Secondary | ICD-10-CM

## 2012-07-31 DIAGNOSIS — IMO0002 Reserved for concepts with insufficient information to code with codable children: Secondary | ICD-10-CM

## 2012-07-31 DIAGNOSIS — E1165 Type 2 diabetes mellitus with hyperglycemia: Secondary | ICD-10-CM | POA: Diagnosis not present

## 2012-07-31 LAB — HEMOGLOBIN A1C
Hgb A1c MFr Bld: 6.7 % — ABNORMAL HIGH (ref <5.7)
Mean Plasma Glucose: 146 mg/dL — ABNORMAL HIGH (ref <117)

## 2012-07-31 MED ORDER — CLOBETASOL PROPIONATE 0.05 % EX CREA: TOPICAL_CREAM | CUTANEOUS | Status: AC

## 2012-07-31 MED ORDER — SPIRONOLACTONE 25 MG PO TABS: ORAL_TABLET | ORAL | Status: DC

## 2012-07-31 MED ORDER — LOSARTAN POTASSIUM 100 MG PO TABS: ORAL_TABLET | ORAL | Status: DC

## 2012-07-31 MED ORDER — POTASSIUM CHLORIDE CRYS ER 20 MEQ PO TBCR: EXTENDED_RELEASE_TABLET | ORAL | Status: DC

## 2012-07-31 MED ORDER — ALPRAZOLAM 0.5 MG PO TABS: ORAL_TABLET | ORAL | Status: DC

## 2012-07-31 MED ORDER — LORATADINE 10 MG PO TABS: 10.0000 mg | ORAL_TABLET | Freq: Every day | ORAL | Status: DC

## 2012-07-31 NOTE — Patient Instructions (Addendum)
Annual wellness in 4 month, please call if you need me before  Blood pressure, cholesterol and blood sugar are all excellent, no med changes   Return on 08/07/2012 for flu vaccine    hBA1C in 4 month   Take sudafed one daily a for the next 3 days , then , as needed, for excessive sinus drainage  New cream to be applied to rash on legs is prescribed

## 2012-07-31 NOTE — Progress Notes (Signed)
  Subjective:    Patient ID: Emily Jennings, female    DOB: [birth year], 78 y.o.   MRN: [id number]  HPI The PT is here for follow up and re-evaluation of chronic medical conditions, medication management and review of any available recent lab and radiology data.  Preventive health is updated, specifically  Cancer screening and Immunization.   Currently on treatment for acute sinusitis, this is day 3, and she reports much improvement The PT denies any adverse reactions to current medications since the last visit.  C/o pruritid flat red rash on leg x 2 weeks , wants med for ths, staaes it comes and goes, thinks it is her eczema      Review of Systems See HPI Denies recent fever or chills. Denies sinus pressure, nasal congestion, ear pain or sore throat. Denies chest congestion, productive cough or wheezing. Denies chest pains, palpitations and leg swelling Denies abdominal pain, nausea, vomiting,diarrhea or constipation.   Denies dysuria, frequency, hesitancy or incontinence. Denies joint pain, swelling and limitation in mobility. Denies headaches, seizures, numbness, or tingling. Denies depression, anxiety or insomnia.         Objective:   Physical Exam  Patient alert and oriented and in no cardiopulmonary distress.  HEENT: No facial asymmetry, EOMI, maxillary  sinus tenderness,  oropharynx pink and moist.  Neck supple no adenopathy.  Chest: Clear to auscultation bilaterally.  CVS: S1, S2 no murmurs, no S3.  ABD: Soft non tender. Bowel sounds normal.  Ext: No edema  MS: Adequate ROM spine, shoulders, hips and knees.  Skin: Intact,erythematous macular rash on left leg  Psych: Good eye contact, normal affect. Memory intact not anxious or depressed appearing.  CNS: CN 2-12 intact, power, tone and sensation normal throughout. Diabetic Foot Check:  Appearance - no lesions, ulcers or calluses.[place] and crowding of toes Skin - no unusual pallor or redness Sensation -  grossly intact to light touch Pulses Left - Dorsalis Pedis and Posterior Tibia normal Right - Dorsalis Pedis and Posterior Tibia normal        Assessment & Plan:

## 2012-08-04 NOTE — Assessment & Plan Note (Signed)
Sleep hygiene discussed, continue xanax as before

## 2012-08-04 NOTE — Assessment & Plan Note (Signed)
Controlled, no change in medication DASH diet and commitment to daily physical activity for a minimum of 30 minutes discussed and encouraged, as a part of hypertension management. The importance of attaining a healthy weight is also discussed.  

## 2012-08-04 NOTE — Assessment & Plan Note (Signed)
Followed by ooncology, on tamoxifen, no recurrence in 4 years

## 2012-08-04 NOTE — Assessment & Plan Note (Signed)
Increased symptoms with change in weather, importance of daily med stressed as well as saline flushes

## 2012-08-04 NOTE — Assessment & Plan Note (Signed)
Controlled, no change in medication  

## 2012-08-04 NOTE — Assessment & Plan Note (Signed)
Improving on antibiotics, pt to complete course before she returns for the flu vaccine

## 2012-08-04 NOTE — Assessment & Plan Note (Signed)
Allergic type rash on leg, steroid prescribed

## 2012-08-04 NOTE — Assessment & Plan Note (Signed)
Controlled, no change in medication Patient educated about the importance of limiting  Carbohydrate intake , the need to commit to daily physical activity for a minimum of 30 minutes , and to commit weight loss. The fact that changes in all these areas will reduce or eliminate all together the development of diabetes is stressed.    

## 2012-08-06 ENCOUNTER — Other Ambulatory Visit: Payer: Self-pay | Admitting: Family Medicine

## 2012-08-14 ENCOUNTER — Other Ambulatory Visit: Payer: Self-pay | Admitting: Family Medicine

## 2012-08-23 ENCOUNTER — Encounter: Payer: Self-pay | Admitting: Family Medicine

## 2012-08-23 ENCOUNTER — Ambulatory Visit (INDEPENDENT_AMBULATORY_CARE_PROVIDER_SITE_OTHER): Payer: Medicare Other | Admitting: Family Medicine

## 2012-08-23 VITALS — BP 140/70 | HR 82 | Temp 98.6°F | Resp 15 | Wt 126.0 lb

## 2012-08-23 DIAGNOSIS — E785 Hyperlipidemia, unspecified: Secondary | ICD-10-CM

## 2012-08-23 DIAGNOSIS — J309 Allergic rhinitis, unspecified: Secondary | ICD-10-CM | POA: Diagnosis not present

## 2012-08-23 DIAGNOSIS — Z1211 Encounter for screening for malignant neoplasm of colon: Secondary | ICD-10-CM

## 2012-08-23 DIAGNOSIS — N058 Unspecified nephritic syndrome with other morphologic changes: Secondary | ICD-10-CM | POA: Diagnosis not present

## 2012-08-23 DIAGNOSIS — J301 Allergic rhinitis due to pollen: Secondary | ICD-10-CM

## 2012-08-23 DIAGNOSIS — E1129 Type 2 diabetes mellitus with other diabetic kidney complication: Secondary | ICD-10-CM

## 2012-08-23 DIAGNOSIS — IMO0002 Reserved for concepts with insufficient information to code with codable children: Secondary | ICD-10-CM

## 2012-08-23 DIAGNOSIS — I10 Essential (primary) hypertension: Secondary | ICD-10-CM

## 2012-08-23 MED ORDER — PREDNISONE (PAK) 5 MG PO TABS: 5.0000 mg | ORAL_TABLET | ORAL | Status: DC

## 2012-08-23 MED ORDER — METHYLPREDNISOLONE ACETATE 80 MG/ML IJ SUSP
80.0000 mg | Freq: Once | INTRAMUSCULAR | Status: AC
  Administered 2012-08-23: 80 mg via INTRAMUSCULAR

## 2012-08-23 NOTE — Progress Notes (Signed)
  Subjective:    Patient ID: Emily Jennings, female    DOB: 12/09/1933, 78 y.o.   MRN: [id number]  HPI 1 week h/o increased and uncontrolled allergy symptoms, excessive clear nasal drainage, sneezing and cough. No sputum production, fever or chill, , no ear pain , tickle in throat, but not excessive pain. Health history review reveals tat colonoscopy is past due so she is refered for same, denies change in BM, has no known f/h of colon ca Blood sugars remain within range when tested, eye exam has been completed this year   Review of Systems See HPI Denies recent fever or chills. c/o sinus pressure and  nasal congestion,denies  ear pain or sore throat. Denies chest congestion, productive cough or wheezing. Denies chest pains, palpitations and leg swelling Denies abdominal pain, nausea, vomiting,diarrhea or constipation.   Denies dysuria, frequency, hesitancy or incontinence. Denies joint pain, swelling and limitation in mobility. Denies headaches, seizures, numbness, or tingling. Denies depression, anxiety or insomnia. Denies skin break down or rash.        Objective:   Physical Exam  Patient alert and oriented and in no cardiopulmonary distress.  HEENT: No facial asymmetry, EOMI, mild maxillary and frontal sinus tenderness,  oropharynx pink and moist.  Neck supple no adenopathy.Nasal mucosa erythematous and edematous. TM clear  Chest: Clear to auscultation bilaterally.  CVS: S1, S2 no murmurs, no S3.  ABD: Soft non tender. Bowel sounds normal.  Ext: No edema  MS: Adequate ROM spine, shoulders, hips and knees.  Skin: Intact, no ulcerations or rash noted.  Psych: Good eye contact, normal affect. Memory intact not anxious or depressed appearing.  CNS: CN 2-12 intact, power, tone and sensation normal throughout.       Assessment & Plan:

## 2012-08-23 NOTE — Patient Instructions (Addendum)
F/u in end February, call if you need me before  You are being  Referred for  a colonoscopy since past due.  You are being treated for uncontrolled allergies, with depo medrol 80mg   im in office and prednisone dose pack. Your bloodsugar will likely increase slightly during the time you are on prednisone. If sugars are staying above 250 pls call. Drink a lot of water.  Ok to take sudafed one twice daily for the next 3 to 5 days  hBA1C , cmp and eGFR end feb , non fasting, before visit

## 2012-08-25 NOTE — Assessment & Plan Note (Signed)
Uncontrolled, pred dose apck and sudafed

## 2012-08-25 NOTE — Assessment & Plan Note (Signed)
Controlled, no change in medication Patient advised to reduce carb and sweets, commit to regular physical activity, take meds as prescribed, test blood as directed, and attempt to lose weight, to improve blood sugar control.  

## 2012-08-25 NOTE — Assessment & Plan Note (Signed)
Slightly increased systolic, however has recently been taking sudafed as decongestant

## 2012-08-25 NOTE — Assessment & Plan Note (Signed)
Uncontrolled symptoms, short sharp course of steroids and in office depo medrol

## 2012-08-26 ENCOUNTER — Encounter (INDEPENDENT_AMBULATORY_CARE_PROVIDER_SITE_OTHER): Payer: Self-pay | Admitting: *Deleted

## 2012-10-14 DIAGNOSIS — H2510 Age-related nuclear cataract, unspecified eye: Secondary | ICD-10-CM | POA: Diagnosis not present

## 2012-10-14 DIAGNOSIS — H04129 Dry eye syndrome of unspecified lacrimal gland: Secondary | ICD-10-CM | POA: Diagnosis not present

## 2012-10-24 ENCOUNTER — Other Ambulatory Visit: Payer: Self-pay | Admitting: Family Medicine

## 2012-10-30 DIAGNOSIS — N058 Unspecified nephritic syndrome with other morphologic changes: Secondary | ICD-10-CM | POA: Diagnosis not present

## 2012-10-30 DIAGNOSIS — E1165 Type 2 diabetes mellitus with hyperglycemia: Secondary | ICD-10-CM | POA: Diagnosis not present

## 2012-10-30 LAB — COMPLETE METABOLIC PANEL WITH GFR
ALT: 12 U/L (ref 0–35)
AST: 17 U/L (ref 0–37)
Alkaline Phosphatase: 55 U/L (ref 39–117)
CO2: 28 mEq/L (ref 19–32)
Creat: 1.18 mg/dL — ABNORMAL HIGH (ref 0.50–1.10)
GFR, Est African American: 52 mL/min — ABNORMAL LOW
Sodium: 141 mEq/L (ref 135–145)
Total Bilirubin: 0.3 mg/dL (ref 0.3–1.2)
Total Protein: 6.5 g/dL (ref 6.0–8.3)

## 2012-10-30 LAB — HEMOGLOBIN A1C
Hgb A1c MFr Bld: 7.4 % — ABNORMAL HIGH (ref <5.7)
Mean Plasma Glucose: 166 mg/dL — ABNORMAL HIGH (ref <117)

## 2012-10-31 ENCOUNTER — Other Ambulatory Visit: Payer: Self-pay | Admitting: Family Medicine

## 2012-11-11 ENCOUNTER — Ambulatory Visit (INDEPENDENT_AMBULATORY_CARE_PROVIDER_SITE_OTHER): Payer: Medicare Other | Admitting: Family Medicine

## 2012-11-11 ENCOUNTER — Encounter: Payer: Self-pay | Admitting: Family Medicine

## 2012-11-11 VITALS — BP 140/72 | HR 62 | Resp 16 | Ht 61.5 in | Wt 125.4 lb

## 2012-11-11 DIAGNOSIS — IMO0001 Reserved for inherently not codable concepts without codable children: Secondary | ICD-10-CM

## 2012-11-11 DIAGNOSIS — R809 Proteinuria, unspecified: Secondary | ICD-10-CM

## 2012-11-11 DIAGNOSIS — E1129 Type 2 diabetes mellitus with other diabetic kidney complication: Secondary | ICD-10-CM

## 2012-11-11 DIAGNOSIS — Z9889 Other specified postprocedural states: Secondary | ICD-10-CM

## 2012-11-11 DIAGNOSIS — R5383 Other fatigue: Secondary | ICD-10-CM

## 2012-11-11 DIAGNOSIS — R350 Frequency of micturition: Secondary | ICD-10-CM | POA: Insufficient documentation

## 2012-11-11 DIAGNOSIS — I10 Essential (primary) hypertension: Secondary | ICD-10-CM

## 2012-11-11 DIAGNOSIS — Z86010 Personal history of colonic polyps: Secondary | ICD-10-CM

## 2012-11-11 DIAGNOSIS — Z1211 Encounter for screening for malignant neoplasm of colon: Secondary | ICD-10-CM

## 2012-11-11 DIAGNOSIS — N390 Urinary tract infection, site not specified: Secondary | ICD-10-CM | POA: Diagnosis not present

## 2012-11-11 DIAGNOSIS — Z0000 Encounter for general adult medical examination without abnormal findings: Secondary | ICD-10-CM

## 2012-11-11 DIAGNOSIS — R5381 Other malaise: Secondary | ICD-10-CM

## 2012-11-11 LAB — POCT URINALYSIS DIPSTICK
Blood, UA: NEGATIVE
Glucose, UA: 100
Nitrite, UA: NEGATIVE
Urobilinogen, UA: 0.2
pH, UA: 6.5

## 2012-11-11 MED ORDER — OXYBUTYNIN CHLORIDE 5 MG PO TABS: 5.0000 mg | ORAL_TABLET | Freq: Two times a day (BID) | ORAL | Status: DC

## 2012-11-11 MED ORDER — ALPRAZOLAM 0.5 MG PO TABS: ORAL_TABLET | ORAL | Status: DC

## 2012-11-11 MED ORDER — LOSARTAN POTASSIUM 100 MG PO TABS: ORAL_TABLET | ORAL | Status: DC

## 2012-11-11 MED ORDER — GLIPIZIDE ER 5 MG PO TB24: ORAL_TABLET | ORAL | Status: DC

## 2012-11-11 NOTE — Assessment & Plan Note (Signed)
Ccua, also trial of oxybutynin

## 2012-11-11 NOTE — Patient Instructions (Addendum)
F/u June.Call if you need me before  HBA1C, cmp and EGFr, non fasting before visit in June  You are referred for colonoscopy in [facility] peer your request  New medication for urinary incontinence

## 2012-11-11 NOTE — Progress Notes (Signed)
Subjective:    Patient ID: Emily Jennings, female    DOB: [birth year], 79 y.o.   MRN: [id number]  HPI  Preventive Screening-Counseling & Management   Patient present here today for a Medicare annual wellness visit.  C/o urinary frequency and inability to hold urine which is worsening Current Problems (verified)   Medications Prior to Visit Allergies (verified)   PAST HISTORY  Family History; 2 brothers and 2 sisters living. Both brothers have had CABG this year, they are both in their 50's, no cancer, no stroke  Social History Married for 78 years mom of 2 adult children. 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 Never tobacco, or drug use, no alcohol   Risk Factors  Current exercise habits:  2 days per week 15 mins. Needs to increase to 5 days per week 30 mions  Dietary issues discussed:low carb, low sodium, low fat diet   Cardiac risk factors: dM,   Depression Screen  (Note: if answer to either of the following is "Yes", a more complete depression screening is indicated)   Over the past two weeks, have you felt down, depressed or hopeless? No  Over the past two weeks, have you felt little interest or pleasure in doing things? No  Have you lost interest or pleasure in daily life? No  Do you often feel hopeless? No  Do you cry easily over simple problems? No   Activities of Daily Living  In your present state of health, do you have any difficulty performing the following activities?  Driving?: No Managing money?: No Feeding yourself?:No Getting from bed to chair?:No Climbing a flight of stairs?:occasional right knee pain with near buckle once last year while climbing steps Preparing food and eating?:No Bathing or showering?:No Getting dressed?:No Getting to the toilet?:No Using the toilet?:No Moving around from place to place?: No  Fall Risk Assessment In the past year have you fallen or had a near fall?:No Are you currently taking any medications that make you  dizziness?:No   Hearing Difficulties: No Do you often ask people to speak up or repeat themselves?:yes reports hearing loss in right ear, but able to hear me clearly . No interest in ENT eval at this time Do you experience ringing or noises in your ears?:No Do you have difficulty understanding soft or whispered voices?:yes Cognitive Testing  Alert? Yes Normal Appearance?Yes  Oriented to person? Yes Place? Yes  Time? Yes  Displays appropriate judgment?Yes  Can read the correct time from a watch face? yes Are you having problems remembering things?yes, failed mini cog with clock draw  Advanced Directives have been discussed with the patient?Yes    List the Names of Other Physician/Practitioners you currently use:    Indicate any recent Medical Services you may have received from other than [facility] providers in the past year (date may be approximate).   Assessment:    Annual Wellness Exam   Plan:    During the course of the visit the patient was educated and counseled about appropriate screening and preventive services including:  A healthy diet is rich in fruit, vegetables and whole grains. Poultry fish, nuts and beans are a healthy choice for protein rather then red meat. A low sodium diet and drinking 64 ounces of water daily is generally recommended. Oils and sweet should be limited. Carbohydrates especially for those who are diabetic or overweight, should be limited to 30-45 gram per meal. It is important to eat on a regular schedule, at least 3  times daily. Snacks should be primarily fruits, vegetables or nuts. It is important that you exercise regularly at least 30 minutes 5 times a week. If you develop chest pain, have severe difficulty breathing, or feel very tired, stop exercising immediately and seek medical attention  Immunization reviewed and updated. Cancer screening reviewed and updated    Patient Instructions (the written plan) was given to the patient.  Medicare  Attestation  I have personally reviewed:  The patient's medical and social history  Their use of alcohol, tobacco or illicit drugs  Their current medications and supplements  The patient's functional ability including ADLs,fall risks, home safety risks, cognitive, and hearing and visual impairment  Diet and physical activities  Evidence for depression or mood disorders  The patient's weight, height, BMI, and visual acuity have been recorded in the chart. I have made referrals, counseling, and provided education to the patient based on review of the above and I have provided the patient with a written personalized care plan for preventive services.     Review of Systems     Objective:   Physical Exam  CCUA: trace leukocytes, will await c/s before prescribing antibiotic. Trial of oxybutynin, as she has symptoms of urge incontinence      Assessment & Plan:

## 2012-11-11 NOTE — Assessment & Plan Note (Signed)
Annual wellness as documented. Pt is highly functional, not depressed, not a high fall risk and memory intact,  Advised pt of the need for regular physical activity

## 2012-11-12 ENCOUNTER — Encounter: Payer: Self-pay | Admitting: Gastroenterology

## 2012-11-13 ENCOUNTER — Encounter: Payer: Medicare Other | Admitting: Internal Medicine

## 2012-11-13 DIAGNOSIS — C50919 Malignant neoplasm of unspecified site of unspecified female breast: Secondary | ICD-10-CM

## 2012-11-13 LAB — URINE CULTURE: Organism ID, Bacteria: NO GROWTH

## 2012-11-29 ENCOUNTER — Encounter: Payer: Medicare Other | Admitting: Family Medicine

## 2012-12-10 ENCOUNTER — Ambulatory Visit (AMBULATORY_SURGERY_CENTER): Payer: Medicare Other | Admitting: *Deleted

## 2012-12-10 VITALS — Ht 61.5 in | Wt 127.0 lb

## 2012-12-10 DIAGNOSIS — Z800 Family history of malignant neoplasm of digestive organs: Secondary | ICD-10-CM

## 2012-12-10 DIAGNOSIS — Z1211 Encounter for screening for malignant neoplasm of colon: Secondary | ICD-10-CM

## 2012-12-10 MED ORDER — NA SULFATE-K SULFATE-MG SULF 17.5-3.13-1.6 GM/177ML PO SOLN: ORAL | Status: DC

## 2012-12-10 NOTE — Progress Notes (Addendum)
Pt had colonoscopy previously with Dr. Tamala Julian at [facility].  Release of information sheet filled out and given to Hope Pigeon, [place]

## 2012-12-16 DIAGNOSIS — N83209 Unspecified ovarian cyst, unspecified side: Secondary | ICD-10-CM | POA: Diagnosis not present

## 2012-12-16 DIAGNOSIS — Z853 Personal history of malignant neoplasm of breast: Secondary | ICD-10-CM | POA: Diagnosis not present

## 2012-12-16 DIAGNOSIS — D259 Leiomyoma of uterus, unspecified: Secondary | ICD-10-CM | POA: Diagnosis not present

## 2012-12-16 DIAGNOSIS — N852 Hypertrophy of uterus: Secondary | ICD-10-CM | POA: Diagnosis not present

## 2012-12-24 ENCOUNTER — Ambulatory Visit (AMBULATORY_SURGERY_CENTER): Payer: Medicare Other | Admitting: Gastroenterology

## 2012-12-24 ENCOUNTER — Encounter: Payer: Self-pay | Admitting: Gastroenterology

## 2012-12-24 ENCOUNTER — Other Ambulatory Visit: Payer: Self-pay | Admitting: Gastroenterology

## 2012-12-24 VITALS — BP 145/78 | HR 78 | Temp 99.1°F | Resp 24 | Ht 61.5 in | Wt 127.0 lb

## 2012-12-24 DIAGNOSIS — I2510 Atherosclerotic heart disease of native coronary artery without angina pectoris: Secondary | ICD-10-CM | POA: Diagnosis not present

## 2012-12-24 DIAGNOSIS — F411 Generalized anxiety disorder: Secondary | ICD-10-CM | POA: Diagnosis not present

## 2012-12-24 DIAGNOSIS — E119 Type 2 diabetes mellitus without complications: Secondary | ICD-10-CM | POA: Diagnosis not present

## 2012-12-24 DIAGNOSIS — Z1211 Encounter for screening for malignant neoplasm of colon: Secondary | ICD-10-CM | POA: Diagnosis not present

## 2012-12-24 DIAGNOSIS — D126 Benign neoplasm of colon, unspecified: Secondary | ICD-10-CM

## 2012-12-24 DIAGNOSIS — I10 Essential (primary) hypertension: Secondary | ICD-10-CM | POA: Diagnosis not present

## 2012-12-24 LAB — GLUCOSE, CAPILLARY: Glucose-Capillary: 119 mg/dL — ABNORMAL HIGH (ref 70–99)

## 2012-12-24 MED ORDER — SODIUM CHLORIDE 0.9 % IV SOLN: 500.0000 mL | INTRAVENOUS | Status: DC

## 2012-12-24 NOTE — Progress Notes (Signed)
Called to room to assist during endoscopic procedure.  Patient ID and intended procedure confirmed with present staff. Received instructions for my participation in the procedure from the performing physician.  

## 2012-12-24 NOTE — Patient Instructions (Addendum)
YOU HAD AN ENDOSCOPIC PROCEDURE TODAY AT THE [facility]: Refer to the procedure report that was given to you for any specific questions about what was found during the examination.  If the procedure report does not answer your questions, please call your gastroenterologist to clarify.  If you requested that your care partner not be given the details of your procedure findings, then the procedure report has been included in a sealed envelope for you to review at your convenience later.  YOU SHOULD EXPECT: Some feelings of bloating in the abdomen. Passage of more gas than usual.  Walking can help get rid of the air that was put into your GI tract during the procedure and reduce the bloating. If you had a lower endoscopy (such as a colonoscopy or flexible sigmoidoscopy) you may notice spotting of blood in your stool or on the toilet paper. If you underwent a bowel prep for your procedure, then you may not have a normal bowel movement for a few days.  DIET: Your first meal following the procedure should be a light meal and then it is ok to progress to your normal diet.  A half-sandwich or bowl of soup is an example of a good first meal.  Heavy or fried foods are harder to digest and may make you feel nauseous or bloated.  Likewise meals heavy in dairy and vegetables can cause extra gas to form and this can also increase the bloating.  Drink plenty of fluids but you should avoid alcoholic beverages for 24 hours.  ACTIVITY: Your care partner should take you home directly after the procedure.  You should plan to take it easy, moving slowly for the rest of the day.  You can resume normal activity the day after the procedure however you should NOT DRIVE or use heavy machinery for 24 hours (because of the sedation medicines used during the test).    SYMPTOMS TO REPORT IMMEDIATELY: A gastroenterologist can be reached at any hour.  During normal business hours, 8:30 AM to 5:00 PM Monday through Friday,  call [phone].  After hours and on weekends, please call the GI answering service at [phone] who will take a message and have the physician on call contact you.   Following lower endoscopy (colonoscopy or flexible sigmoidoscopy):  Excessive amounts of blood in the stool  Significant tenderness or worsening of abdominal pains  Swelling of the abdomen that is new, acute  Fever of 100F or higher  Following upper endoscopy (EGD)  Vomiting of blood or coffee ground material  New chest pain or pain under the shoulder blades  Painful or persistently difficult swallowing  New shortness of breath  Fever of 100F or higher  Black, tarry-looking stools  FOLLOW UP: If any biopsies were taken you will be contacted by phone or by letter within the next 1-3 weeks.  Call your gastroenterologist if you have not heard about the biopsies in 3 weeks.  Our staff will call the home number listed on your records the next business day following your procedure to check on you and address any questions or concerns that you may have at that time regarding the information given to you following your procedure. This is a courtesy call and so if there is no answer at the home number and we have not heard from you through the emergency physician on call, we will assume that you have returned to your regular daily activities without incident.  SIGNATURES/CONFIDENTIALITY: You and/or your care   partner have signed paperwork which will be entered into your electronic medical record.  These signatures attest to the fact that that the information above on your After Visit Summary has been reviewed and is understood.  Full responsibility of the confidentiality of this discharge information lies with you and/or your care-partner.  

## 2012-12-24 NOTE — Op Note (Signed)
[facility]  [address] [place], [id number]   COLONOSCOPY PROCEDURE REPORT  PATIENT: Emily Jennings, Emily Jennings  MR#: [id number] BIRTHDATE: April 23, 1934 , 78  yrs. old GENDER: Female ENDOSCOPIST: Ladene Artist, MD, [facility] REFERRED [id number] Moshe Cipro, M.D. PROCEDURE DATE:  12/24/2012 PROCEDURE:   Colonoscopy with snare polypectomy ASA CLASS:   Class II INDICATIONS:average risk screening. MEDICATIONS: MAC sedation, administered by CRNA and propofol (Diprivan) 90mg  IV DESCRIPTION OF PROCEDURE:   After the risks benefits and alternatives of the procedure were thoroughly explained, informed consent was obtained.  A digital rectal exam revealed no abnormalities of the rectum.   The LB [id number]  endoscope was introduced through the anus and advanced to the cecum, which was identified by both the appendix and ileocecal valve. No adverse events experienced.   IC valve photo did not capture. Unable to retroflex in rectum. The quality of the prep was Suprep excellent The instrument was then slowly withdrawn as the colon was fully examined.  COLON FINDINGS: A sessile polyp measuring 6 mm in size was found in the proximal transverse colon.  A polypectomy was performed with a cold snare.  The resection was complete and the polyp tissue was completely retrieved.   A sessile polyp measuring 8 mm in size was found in the distal transverse colon.  A polypectomy was performed using snare cautery.  The resection was complete and the polyp tissue was completely retrieved.   Mild diverticulosis was noted in the sigmoid colon.   The colon was otherwise normal.  There was no diverticulosis, inflammation, polyps or cancers unless previously stated.  Retroflexion was not performed due to a narrow rectal vault. The time to cecum=2 minutes 12 seconds.  Withdrawal time=7 minutes 58 seconds.  The scope was withdrawn and the procedure completed.  COMPLICATIONS: There were no  complications.  ENDOSCOPIC IMPRESSION: 1.   Sessile polyp measuring 6 mm in the proximal transverse colon; polypectomy performed with a cold snare 2.   Sessile polyp measuring 8 mm in the distal transverse colon; polypectomy performed using snare cautery 3.   Mild diverticulosis was noted in the sigmoid colon  RECOMMENDATIONS: 1.  Hold aspirin, aspirin products, and anti-inflammatory medication for 2 weeks. 2.  Await pathology results 3.  High fiber diet with liberal fluid intake. 4.  Consider repeat Colonoscopy in 5 years if polyp(s) adenomatous, although she will be over age 34 then.   eSigned:  Ladene Artist, MD, [facility] 12/24/2012 8:57 AM

## 2012-12-24 NOTE — Progress Notes (Signed)
Patient did not experience any of the following events: a burn prior to discharge; a fall within the facility; wrong site/side/patient/procedure/implant event; or a hospital transfer or hospital admission upon discharge from the facility. (G8907) Patient did not have preoperative order for IV antibiotic SSI prophylaxis. (G8918)  

## 2012-12-24 NOTE — Progress Notes (Signed)
Propofol given over incremental dosages 

## 2012-12-25 ENCOUNTER — Telehealth: Payer: Self-pay

## 2012-12-25 ENCOUNTER — Other Ambulatory Visit: Payer: Self-pay | Admitting: Family Medicine

## 2012-12-25 NOTE — Telephone Encounter (Signed)
Left message on answering machine. 

## 2012-12-31 ENCOUNTER — Encounter: Payer: Self-pay | Admitting: Gastroenterology

## 2012-12-31 DIAGNOSIS — N920 Excessive and frequent menstruation with regular cycle: Secondary | ICD-10-CM | POA: Diagnosis not present

## 2012-12-31 DIAGNOSIS — N950 Postmenopausal bleeding: Secondary | ICD-10-CM | POA: Diagnosis not present

## 2012-12-31 DIAGNOSIS — N859 Noninflammatory disorder of uterus, unspecified: Secondary | ICD-10-CM | POA: Diagnosis not present

## 2013-01-01 ENCOUNTER — Other Ambulatory Visit: Payer: Self-pay

## 2013-01-01 MED ORDER — ALPRAZOLAM 0.5 MG PO TABS: ORAL_TABLET | ORAL | Status: DC

## 2013-01-06 ENCOUNTER — Other Ambulatory Visit: Payer: Self-pay | Admitting: Family Medicine

## 2013-01-10 ENCOUNTER — Ambulatory Visit (INDEPENDENT_AMBULATORY_CARE_PROVIDER_SITE_OTHER): Payer: Medicare Other | Admitting: Family Medicine

## 2013-01-10 ENCOUNTER — Encounter: Payer: Self-pay | Admitting: Family Medicine

## 2013-01-10 VITALS — BP 130/62 | HR 62 | Resp 16 | Wt 123.0 lb

## 2013-01-10 DIAGNOSIS — K219 Gastro-esophageal reflux disease without esophagitis: Secondary | ICD-10-CM

## 2013-01-10 MED ORDER — OMEPRAZOLE 20 MG PO CPDR: 20.0000 mg | DELAYED_RELEASE_CAPSULE | Freq: Every day | ORAL | Status: DC

## 2013-01-10 NOTE — Patient Instructions (Addendum)
Start the acid reflux medication once a day See below Follow-up with Dr. Moshe Cipro  Diet for Gastroesophageal Reflux Disease, Adult Reflux is when stomach acid flows up into the esophagus. The esophagus becomes irritated and sore (inflammation). When reflux happens often and is severe, it is called gastroesophageal reflux disease (GERD). What you eat can help ease any discomfort caused by GERD. FOODS OR DRINKS TO AVOID OR LIMIT  Coffee and black tea, with or without caffeine.  Bubbly (carbonated) drinks with caffeine or energy drinks.  Strong spices, such as pepper, cayenne pepper, curry, or chili powder.  Peppermint or spearmint.  Chocolate.  High-fat foods, such as meats, fried food, oils, butter, or nuts.  Fruits and vegetables that cause discomfort. This includes citrus fruits and tomatoes.  Alcohol. If a certain food or drink irritates your GERD, avoid eating or drinking it. THINGS THAT MAY HELP GERD INCLUDE:  Eat meals slowly.  Eat 5 to 6 small meals a day, not 3 large meals.  Do not eat food for a certain amount of time if it causes discomfort.  Wait 3 hours after eating before lying down.  Keep the head of your bed raised 6 to 9 inches (15 23 centimeters). Put a foam wedge or blocks under the legs of the bed.  Stay active. Weight loss, if needed, may help ease your discomfort.  Wear loose-fitting clothing.  Do not smoke or chew tobacco. Document Released: 03/05/2012 Document Reviewed: 11/14/2011 [facility] Patient Information 2013 [place].

## 2013-01-12 DIAGNOSIS — K219 Gastro-esophageal reflux disease without esophagitis: Secondary | ICD-10-CM | POA: Insufficient documentation

## 2013-01-12 NOTE — Progress Notes (Signed)
  Subjective:    Patient ID: Emily Jennings, female    DOB: [birth year], 79 y.o.   MRN: [id number]  HPI  Pt presents with burning sensation and belching after she eats, worse at night after dinner and she lays down, avoids certain foods such as Oranges, tomatoes makes it worse. Denies abd pain, but had some soreness in upper abdomen.Denies chest pain, SOB. Feels good otherwise, symptoms past couple of weeks, has tried [place] these did not help. Recent colonoscopy by GI. Has felt food sitting in chest after eating denies dysphagia   Review of Systems - per above  GEN- denies fatigue, fever, weight loss,weakness, recent illness HEENT- denies eye drainage, change in vision, nasal discharge, CVS- denies chest pain, palpitations RESP- denies SOB, cough, wheeze ABD- denies N/V, change in stools, abd pain GU- denies dysuria, hematuria, dribbling, incontinence      Objective:   Physical Exam  GEN- NAD, alert and oriented x3 HEENT- PERRL, EOMI, non injected sclera, pink conjunctiva, MMM, oropharynx clear CVS- RRR, no murmur RESP-CTAB ABD-NABS,soft,NT,ND EXT- No edema Pulses- Radial 2+       Assessment & Plan:

## 2013-01-12 NOTE — Assessment & Plan Note (Addendum)
Start prilosec once a day Pt knows foods to avoid If no improvement may need EGD

## 2013-01-23 DIAGNOSIS — E119 Type 2 diabetes mellitus without complications: Secondary | ICD-10-CM | POA: Diagnosis not present

## 2013-01-23 DIAGNOSIS — I10 Essential (primary) hypertension: Secondary | ICD-10-CM | POA: Diagnosis not present

## 2013-01-27 ENCOUNTER — Ambulatory Visit (INDEPENDENT_AMBULATORY_CARE_PROVIDER_SITE_OTHER): Payer: Medicare Other | Admitting: Family Medicine

## 2013-01-27 ENCOUNTER — Encounter: Payer: Self-pay | Admitting: Family Medicine

## 2013-01-27 VITALS — BP 152/76 | HR 80 | Resp 18 | Ht 61.5 in | Wt 125.1 lb

## 2013-01-27 DIAGNOSIS — J42 Unspecified chronic bronchitis: Secondary | ICD-10-CM | POA: Diagnosis not present

## 2013-01-27 DIAGNOSIS — J309 Allergic rhinitis, unspecified: Secondary | ICD-10-CM | POA: Diagnosis not present

## 2013-01-27 DIAGNOSIS — IMO0001 Reserved for inherently not codable concepts without codable children: Secondary | ICD-10-CM

## 2013-01-27 DIAGNOSIS — R809 Proteinuria, unspecified: Secondary | ICD-10-CM

## 2013-01-27 DIAGNOSIS — E1129 Type 2 diabetes mellitus with other diabetic kidney complication: Secondary | ICD-10-CM

## 2013-01-27 DIAGNOSIS — J322 Chronic ethmoidal sinusitis: Secondary | ICD-10-CM | POA: Diagnosis not present

## 2013-01-27 DIAGNOSIS — I10 Essential (primary) hypertension: Secondary | ICD-10-CM

## 2013-01-27 MED ORDER — PENICILLIN V POTASSIUM 500 MG PO TABS: 500.0000 mg | ORAL_TABLET | Freq: Three times a day (TID) | ORAL | Status: DC

## 2013-01-27 MED ORDER — AMLODIPINE BESYLATE 10 MG PO TABS: ORAL_TABLET | ORAL | Status: DC

## 2013-01-27 MED ORDER — BENZONATATE 100 MG PO CAPS: 100.0000 mg | ORAL_CAPSULE | Freq: Three times a day (TID) | ORAL | Status: DC | PRN

## 2013-01-27 MED ORDER — LOSARTAN POTASSIUM 100 MG PO TABS: ORAL_TABLET | ORAL | Status: DC

## 2013-01-27 MED ORDER — PREDNISONE (PAK) 5 MG PO TABS: 5.0000 mg | ORAL_TABLET | ORAL | Status: DC

## 2013-01-27 MED ORDER — METOPROLOL TARTRATE 50 MG PO TABS: ORAL_TABLET | ORAL | Status: DC

## 2013-01-27 MED ORDER — PREDNISONE 5 MG PO TABS: 5.0000 mg | ORAL_TABLET | Freq: Two times a day (BID) | ORAL | Status: AC

## 2013-01-27 NOTE — Patient Instructions (Addendum)
F/u mid to end October  You are prescribed medication for uncontrolled allergies, also decongestant and antibiotics  Emily Jennings will be referred to podiatry for foot care  Non fasting , cmp and EGFR, HBA1C  Early July

## 2013-01-27 NOTE — Progress Notes (Signed)
  Subjective:    Patient ID: Emily Jennings, female    DOB: [birth year], 79 y.o.   MRN: [id number]  HPI 3 day h/o chest congestion with cream sputum also sinus drainage which is thick and foul tasting for approximately 5 days, also reports intermittent chills and malaise. No productive cough but notes chest congestion, denies ear pain. Blood sugars have not as been as good as in the past, but does state that her diet needs improvement   Review of Systems See HPI  Denies chest pains, palpitations and leg swelling Denies abdominal pain, nausea, vomiting,diarrhea or constipation.   Denies dysuria, frequency, hesitancy or incontinence. Denies joint pain, swelling and limitation in mobility. Denies headaches, seizures, numbness, or tingling. Denies depression, anxiety or insomnia. Denies skin break down or rash.        Objective:   Physical Exam  Patient alert and oriented and in no cardiopulmonary distress.  HEENT: No facial asymmetry, EOMI, ethmoid  sinus tenderness,  oropharynx pink and moist.  Neck supple no adenopathy.  Chest: Clear to auscultation bilaterally.No crackles or wheezes, adequate air entry  CVS: S1, S2 no murmurs, no S3.  ABD: Soft non tender. Bowel sounds normal.  Ext: No edema  MS: Adequate ROM spine, shoulders, hips and knees.  Skin: Intact, no ulcerations or rash noted.  Psych: Good eye contact, normal affect. Memory intact not anxious or depressed appearing.  CNS: CN 2-12 intact, power, tone and sensation normal throughout.       Assessment & Plan:

## 2013-02-02 DIAGNOSIS — J322 Chronic ethmoidal sinusitis: Secondary | ICD-10-CM | POA: Insufficient documentation

## 2013-02-02 NOTE — Assessment & Plan Note (Signed)
Adequate control, updated lab prior to next visit. Patient advised to reduce carb and sweets, commit to regular physical activity, take meds as prescribed, test blood as directed, and attempt to lose weight, to improve blood sugar control.

## 2013-02-02 NOTE — Assessment & Plan Note (Signed)
Sub optimal control, however no change in med DASH diet and commitment to daily physical activity for a minimum of 30 minutes discussed and encouraged, as a part of hypertension management. The importance of attaining a healthy weight is also discussed.

## 2013-02-02 NOTE — Assessment & Plan Note (Signed)
Uncontrolled, needs to take medication for allergies daily

## 2013-02-02 NOTE — Assessment & Plan Note (Signed)
deciongestants prescrribed also antibiotics based on current symptoms of acute sinusitis

## 2013-02-02 NOTE — Assessment & Plan Note (Signed)
Antibiotic course prescribed 

## 2013-02-04 ENCOUNTER — Telehealth: Payer: Self-pay | Admitting: Family Medicine

## 2013-02-04 NOTE — Telephone Encounter (Signed)
Hard script written for nonngeneric med, pls let pt know , sher may collect, or you send to pharmacy and let pharmacist know, thanks

## 2013-02-04 NOTE — Telephone Encounter (Signed)
Please advise 

## 2013-02-05 NOTE — Telephone Encounter (Signed)
Sent to pharmacy 

## 2013-02-20 ENCOUNTER — Other Ambulatory Visit: Payer: Self-pay | Admitting: Family Medicine

## 2013-03-03 ENCOUNTER — Ambulatory Visit (INDEPENDENT_AMBULATORY_CARE_PROVIDER_SITE_OTHER): Payer: Medicare Other | Admitting: Family Medicine

## 2013-03-03 ENCOUNTER — Encounter: Payer: Self-pay | Admitting: Family Medicine

## 2013-03-03 VITALS — BP 122/68 | HR 78 | Resp 18 | Ht 61.5 in | Wt 123.1 lb

## 2013-03-03 DIAGNOSIS — R809 Proteinuria, unspecified: Secondary | ICD-10-CM | POA: Diagnosis not present

## 2013-03-03 DIAGNOSIS — K219 Gastro-esophageal reflux disease without esophagitis: Secondary | ICD-10-CM

## 2013-03-03 DIAGNOSIS — G4700 Insomnia, unspecified: Secondary | ICD-10-CM

## 2013-03-03 DIAGNOSIS — N3000 Acute cystitis without hematuria: Secondary | ICD-10-CM

## 2013-03-03 DIAGNOSIS — I10 Essential (primary) hypertension: Secondary | ICD-10-CM

## 2013-03-03 DIAGNOSIS — IMO0001 Reserved for inherently not codable concepts without codable children: Secondary | ICD-10-CM

## 2013-03-03 DIAGNOSIS — F411 Generalized anxiety disorder: Secondary | ICD-10-CM

## 2013-03-03 DIAGNOSIS — E1129 Type 2 diabetes mellitus with other diabetic kidney complication: Secondary | ICD-10-CM

## 2013-03-03 LAB — POCT URINALYSIS DIPSTICK
Bilirubin, UA: NEGATIVE
Glucose, UA: NEGATIVE
Nitrite, UA: NEGATIVE
Urobilinogen, UA: 0.2

## 2013-03-03 MED ORDER — OMEPRAZOLE 20 MG PO CPDR: 20.0000 mg | DELAYED_RELEASE_CAPSULE | Freq: Every day | ORAL | Status: DC

## 2013-03-03 MED ORDER — LOSARTAN POTASSIUM 100 MG PO TABS: ORAL_TABLET | ORAL | Status: DC

## 2013-03-03 MED ORDER — METOPROLOL TARTRATE 50 MG PO TABS: ORAL_TABLET | ORAL | Status: DC

## 2013-03-03 MED ORDER — POTASSIUM CHLORIDE CRYS ER 20 MEQ PO TBCR: EXTENDED_RELEASE_TABLET | ORAL | Status: DC

## 2013-03-03 MED ORDER — AMLODIPINE BESYLATE 10 MG PO TABS: ORAL_TABLET | ORAL | Status: DC

## 2013-03-03 MED ORDER — TEMAZEPAM 15 MG PO CAPS: 15.0000 mg | ORAL_CAPSULE | Freq: Every evening | ORAL | Status: DC | PRN

## 2013-03-03 MED ORDER — GLIPIZIDE ER 5 MG PO TB24: ORAL_TABLET | ORAL | Status: DC

## 2013-03-03 MED ORDER — SPIRONOLACTONE 25 MG PO TABS: ORAL_TABLET | ORAL | Status: DC

## 2013-03-03 NOTE — Assessment & Plan Note (Signed)
Reports recent poor control, labs today. Patient advised to reduce carb and sweets, commit to regular physical activity, take meds as prescribed, test blood as directed, and attempt to lose weight, to improve blood sugar control.

## 2013-03-03 NOTE — Progress Notes (Signed)
  Subjective:    Patient ID: Emily Jennings, female    DOB: 1934/06/05, 79 y.o.   MRN: [id number]  HPI The PT is here for follow up and re-evaluation of chronic medical conditions, medication management and review of any available recent lab and radiology data.  Preventive health is updated, specifically  Cancer screening and Immunization.   Questions or concerns regarding consultations or procedures which the PT has had in the interim are  addressed. States her blood sugar became very high when she took prednisone for one day, went up to 500. This was 6 weeks ago. C/o episode of diarreha  An vomiting for 1 day on 5 /14, states she also had some flank pain. staes family members told her with sugar so high,  She could have a stroke or kidney failure, just coming in to the office after 5 weeks States sugar has been abnormal since since first of June, fasting around 150, yesterday  96, today 110 Left hip pain x 2 weeks, no instability or falls. C/o lower abdominal pain intermittently since colonoscopy, cramping, worried she may have colon cancer, I advised her she was just checked 2 months ago, unlikely anything new has developed  So suddenly, she is to continue to monitor her symptoms  Wants alternative for sleep, reports memory loss and also not effctive anymore     Review of Systems See HPI Denies recent fever or chills. Denies sinus pressure, nasal congestion, ear pain or sore throat. Denies chest congestion, productive cough or wheezing. Denies chest pains, palpitations and leg swelling Denies  nausea, vomiting,diarrhea or constipation.   4 day h/o urinary frequency no dysuria. Denies headaches, seizures, numbness, or tingling.  Denies skin break down or rash.        Objective:   Physical Exam  Patient alert and oriented and in no cardiopulmonary distress.  HEENT: No facial asymmetry, EOMI, no sinus tenderness,  oropharynx pink and moist.  Neck supple no adenopathy.  Chest:  Clear to auscultation bilaterally.  CVS: S1, S2 no murmurs, no S3.  ABD: Soft non tender. Bowel sounds normal.no flank or suprapubic tenderness  Ext: No edema  MS: Adequate ROM spine, shoulders, hips and knees.Area of "tenderness" is over right hip upper aspect  Skin: Intact, no ulcerations or rash noted.  Psych: Good eye contact, normal affect. Memory intact mildly anxious not  depressed appearing.  CNS: CN 2-12 intact, power, tone and sensation normal throughout.       Assessment & Plan:

## 2013-03-03 NOTE — Assessment & Plan Note (Signed)
Sleep hygiene reviewed, trial of restoril

## 2013-03-03 NOTE — Addendum Note (Signed)
Addended by: Denman George B on: 03/03/2013 01:00 PM   Modules accepted: Orders, Medications

## 2013-03-03 NOTE — Assessment & Plan Note (Signed)
Controlled, no change in medication DASH diet and commitment to daily physical activity for a minimum of 30 minutes discussed and encouraged, as a part of hypertension management. The importance of attaining a healthy weight is also discussed.  

## 2013-03-03 NOTE — Assessment & Plan Note (Signed)
Improved, had depended on xanax only for sleep and this is no longer working

## 2013-03-03 NOTE — Assessment & Plan Note (Signed)
Controlled, no change in medication  

## 2013-03-03 NOTE — Assessment & Plan Note (Signed)
Urinary frequency with mildly abnormal UA, will wait on c/s before treating

## 2013-03-03 NOTE — Patient Instructions (Addendum)
F/u in mid October, cancel July appt   Call if you need me before  HBA1C , chem 7 and EGFR today   New medication for sleep is restoril/tamazepam one at bedtime, no more xanax  Practice good sleep hygiene,you will get the sheet again  Pain is over the left hip, and I believe this is due to arthritis, Use aspercreme, or bengay to rub the area .  Urine suggests possible infection, not very likely however.  We will contact you in the next 2 to 3 days if you do have an infection that needs treatment

## 2013-03-04 LAB — COMPLETE METABOLIC PANEL WITH GFR
ALT: 18 U/L (ref 0–35)
Albumin: 4.3 g/dL (ref 3.5–5.2)
Alkaline Phosphatase: 68 U/L (ref 39–117)
CO2: 27 mEq/L (ref 19–32)
GFR, Est African American: 55 mL/min — ABNORMAL LOW
Glucose, Bld: 212 mg/dL — ABNORMAL HIGH (ref 70–99)
Potassium: 4.3 mEq/L (ref 3.5–5.3)
Sodium: 135 mEq/L (ref 135–145)
Total Bilirubin: 0.4 mg/dL (ref 0.3–1.2)
Total Protein: 6.5 g/dL (ref 6.0–8.3)

## 2013-03-04 LAB — HEMOGLOBIN A1C
Hgb A1c MFr Bld: 7.8 % — ABNORMAL HIGH (ref <5.7)
Mean Plasma Glucose: 177 mg/dL — ABNORMAL HIGH (ref <117)

## 2013-03-05 LAB — URINE CULTURE
Colony Count: NO GROWTH
Organism ID, Bacteria: NO GROWTH

## 2013-03-25 DIAGNOSIS — B351 Tinea unguium: Secondary | ICD-10-CM | POA: Diagnosis not present

## 2013-03-25 DIAGNOSIS — M2010 Hallux valgus (acquired), unspecified foot: Secondary | ICD-10-CM | POA: Diagnosis not present

## 2013-03-25 DIAGNOSIS — B353 Tinea pedis: Secondary | ICD-10-CM | POA: Diagnosis not present

## 2013-03-25 DIAGNOSIS — M2040 Other hammer toe(s) (acquired), unspecified foot: Secondary | ICD-10-CM | POA: Diagnosis not present

## 2013-04-07 ENCOUNTER — Other Ambulatory Visit: Payer: Self-pay | Admitting: Family Medicine

## 2013-04-16 ENCOUNTER — Other Ambulatory Visit: Payer: Self-pay | Admitting: Family Medicine

## 2013-04-22 ENCOUNTER — Telehealth: Payer: Self-pay | Admitting: Family Medicine

## 2013-04-22 MED ORDER — GLIPIZIDE ER 5 MG PO TB24: ORAL_TABLET | ORAL | Status: DC

## 2013-04-22 NOTE — Telephone Encounter (Signed)
Refill sent in

## 2013-04-29 ENCOUNTER — Other Ambulatory Visit: Payer: Self-pay | Admitting: Family Medicine

## 2013-05-05 ENCOUNTER — Other Ambulatory Visit: Payer: Self-pay | Admitting: Family Medicine

## 2013-05-16 ENCOUNTER — Other Ambulatory Visit: Payer: Self-pay | Admitting: Family Medicine

## 2013-05-16 DIAGNOSIS — Z23 Encounter for immunization: Secondary | ICD-10-CM | POA: Diagnosis not present

## 2013-05-20 ENCOUNTER — Other Ambulatory Visit: Payer: Self-pay

## 2013-05-20 MED ORDER — OMEPRAZOLE 20 MG PO CPDR: 20.0000 mg | DELAYED_RELEASE_CAPSULE | Freq: Every day | ORAL | Status: DC

## 2013-07-01 DIAGNOSIS — B353 Tinea pedis: Secondary | ICD-10-CM | POA: Diagnosis not present

## 2013-07-01 DIAGNOSIS — B351 Tinea unguium: Secondary | ICD-10-CM | POA: Diagnosis not present

## 2013-07-01 DIAGNOSIS — M79609 Pain in unspecified limb: Secondary | ICD-10-CM | POA: Diagnosis not present

## 2013-07-07 ENCOUNTER — Other Ambulatory Visit: Payer: Self-pay | Admitting: Family Medicine

## 2013-07-07 DIAGNOSIS — E1129 Type 2 diabetes mellitus with other diabetic kidney complication: Secondary | ICD-10-CM | POA: Diagnosis not present

## 2013-07-07 DIAGNOSIS — R809 Proteinuria, unspecified: Secondary | ICD-10-CM | POA: Diagnosis not present

## 2013-07-07 DIAGNOSIS — I10 Essential (primary) hypertension: Secondary | ICD-10-CM | POA: Diagnosis not present

## 2013-07-08 ENCOUNTER — Ambulatory Visit (INDEPENDENT_AMBULATORY_CARE_PROVIDER_SITE_OTHER): Payer: Medicare Other | Admitting: Family Medicine

## 2013-07-08 ENCOUNTER — Encounter (INDEPENDENT_AMBULATORY_CARE_PROVIDER_SITE_OTHER): Payer: Self-pay

## 2013-07-08 ENCOUNTER — Encounter: Payer: Self-pay | Admitting: Family Medicine

## 2013-07-08 VITALS — BP 140/74 | HR 84 | Resp 16 | Ht 61.5 in | Wt 123.1 lb

## 2013-07-08 DIAGNOSIS — E1129 Type 2 diabetes mellitus with other diabetic kidney complication: Secondary | ICD-10-CM

## 2013-07-08 DIAGNOSIS — Z79899 Other long term (current) drug therapy: Secondary | ICD-10-CM | POA: Diagnosis not present

## 2013-07-08 DIAGNOSIS — E119 Type 2 diabetes mellitus without complications: Secondary | ICD-10-CM

## 2013-07-08 DIAGNOSIS — R809 Proteinuria, unspecified: Secondary | ICD-10-CM

## 2013-07-08 DIAGNOSIS — I10 Essential (primary) hypertension: Secondary | ICD-10-CM | POA: Diagnosis not present

## 2013-07-08 DIAGNOSIS — R5381 Other malaise: Secondary | ICD-10-CM

## 2013-07-08 DIAGNOSIS — K219 Gastro-esophageal reflux disease without esophagitis: Secondary | ICD-10-CM

## 2013-07-08 DIAGNOSIS — IMO0001 Reserved for inherently not codable concepts without codable children: Secondary | ICD-10-CM

## 2013-07-08 LAB — HEMOGLOBIN A1C
Hgb A1c MFr Bld: 7.2 % — ABNORMAL HIGH (ref <5.7)
Mean Plasma Glucose: 160 mg/dL — ABNORMAL HIGH (ref <117)

## 2013-07-08 LAB — COMPLETE METABOLIC PANEL WITH GFR
ALT: 14 U/L (ref 0–35)
Albumin: 4 g/dL (ref 3.5–5.2)
BUN: 26 mg/dL — ABNORMAL HIGH (ref 6–23)
CO2: 29 mEq/L (ref 19–32)
Calcium: 9.2 mg/dL (ref 8.4–10.5)
Chloride: 104 mEq/L (ref 96–112)
Creat: 1.01 mg/dL (ref 0.50–1.10)
GFR, Est African American: 62 mL/min
Potassium: 4.3 mEq/L (ref 3.5–5.3)

## 2013-07-08 MED ORDER — METOPROLOL TARTRATE 50 MG PO TABS: ORAL_TABLET | ORAL | Status: DC

## 2013-07-08 MED ORDER — POTASSIUM CHLORIDE CRYS ER 20 MEQ PO TBCR: EXTENDED_RELEASE_TABLET | ORAL | Status: DC

## 2013-07-08 MED ORDER — AMLODIPINE BESYLATE 10 MG PO TABS: ORAL_TABLET | ORAL | Status: DC

## 2013-07-08 MED ORDER — LOSARTAN POTASSIUM 100 MG PO TABS: ORAL_TABLET | ORAL | Status: DC

## 2013-07-08 NOTE — Patient Instructions (Addendum)
F/u, for annual wellness, in feb 27 or after call if you need me before  Fasting lipid, cmp and EGFR, hBA1c, TSH , CBC, microalb  lab Feb 25 or after   No med changes, you are doing very well  It is important that you exercise regularly at least 30 minutes 5 times a week. If you develop chest pain, have severe difficulty breathing, or feel very tired, stop exercising immediately and seek medical attention

## 2013-07-08 NOTE — Progress Notes (Signed)
  Subjective:    Patient ID: Emily Jennings, female    DOB: [birth year], 79 y.o.   MRN: [id number]  HPI The PT is here for follow up and re-evaluation of chronic medical conditions, medication management and review of any available recent lab and radiology data.  Preventive health is updated, specifically  Cancer screening and Immunization.   Questions or concerns regarding consultations or procedures which the PT has had in the interim are  addressed. The PT denies any adverse reactions to current medications since the last visit.  There are no new concerns.  There are no specific complaints       Review of Systems See HPI Denies recent fever or chills. Denies sinus pressure, nasal congestion, ear pain or sore throat. Denies chest congestion, productive cough or wheezing. Denies chest pains, palpitations and leg swelling Denies abdominal pain, nausea, vomiting,diarrhea or constipation.   Denies dysuria, frequency, hesitancy or incontinence. Denies joint pain, swelling and limitation in mobility. Denies headaches, seizures, numbness, or tingling. Denies depression, anxiety or insomnia. Denies skin break down or rash.        Objective:   Physical Exam  Patient alert and oriented and in no cardiopulmonary distress.  HEENT: No facial asymmetry, EOMI, no sinus tenderness,  oropharynx pink and moist.  Neck supple no adenopathy.  Chest: Clear to auscultation bilaterally.  CVS: S1, S2 no murmurs, no S3.  ABD: Soft non tender. Bowel sounds normal.  Ext: No edema  MS: Adequate ROM spine, shoulders, hips and knees.  Skin: Intact, no ulcerations or rash noted.  Psych: Good eye contact, normal affect. Memory intact not anxious or depressed appearing.  CNS: CN 2-12 intact, power, tone and sensation normal throughout.       Assessment & Plan:

## 2013-08-05 ENCOUNTER — Other Ambulatory Visit: Payer: Self-pay | Admitting: Family Medicine

## 2013-08-05 DIAGNOSIS — Z9889 Other specified postprocedural states: Secondary | ICD-10-CM

## 2013-08-05 DIAGNOSIS — C50912 Malignant neoplasm of unspecified site of left female breast: Secondary | ICD-10-CM

## 2013-08-19 ENCOUNTER — Telehealth: Payer: Self-pay | Admitting: Family Medicine

## 2013-08-19 NOTE — Assessment & Plan Note (Signed)
Controlled, no change in medication DASH diet and commitment to daily physical activity for a minimum of 30 minutes discussed and encouraged, as a part of hypertension management. The importance of attaining a healthy weight is also discussed.  

## 2013-08-19 NOTE — Telephone Encounter (Signed)
Pls contact pt, explain that though cholesterol last Nov 2013, when checked, medical guidelines recommend "statin" to reduce risk of vascular ds in diabetics, heart disease and stroke, so I have entered pravachol 10mg  pls fax in after you spk to her if she agrees , if not let me know pls

## 2013-08-19 NOTE — Assessment & Plan Note (Signed)
Controlled, no change in medication  

## 2013-08-19 NOTE — Assessment & Plan Note (Signed)
Controlled, no change in medication Patient advised to reduce carb and sweets, commit to regular physical activity, take meds as prescribed, test blood as directed, and attempt to lose weight, to improve blood sugar control.  

## 2013-08-26 DIAGNOSIS — H04129 Dry eye syndrome of unspecified lacrimal gland: Secondary | ICD-10-CM | POA: Diagnosis not present

## 2013-08-26 DIAGNOSIS — H2510 Age-related nuclear cataract, unspecified eye: Secondary | ICD-10-CM | POA: Diagnosis not present

## 2013-08-26 DIAGNOSIS — E1139 Type 2 diabetes mellitus with other diabetic ophthalmic complication: Secondary | ICD-10-CM | POA: Diagnosis not present

## 2013-08-27 ENCOUNTER — Ambulatory Visit (HOSPITAL_COMMUNITY)
Admission: RE | Admit: 2013-08-27 | Discharge: 2013-08-27 | Disposition: A | Discharge status: Home / Self Care | Payer: Medicare Other | Source: Ambulatory Visit | Attending: Family Medicine | Admitting: Family Medicine

## 2013-08-27 DIAGNOSIS — C50912 Malignant neoplasm of unspecified site of left female breast: Secondary | ICD-10-CM

## 2013-08-27 DIAGNOSIS — R922 Inconclusive mammogram: Secondary | ICD-10-CM | POA: Diagnosis not present

## 2013-08-27 DIAGNOSIS — Z853 Personal history of malignant neoplasm of breast: Secondary | ICD-10-CM | POA: Insufficient documentation

## 2013-08-27 DIAGNOSIS — Z9889 Other specified postprocedural states: Secondary | ICD-10-CM

## 2013-09-04 ENCOUNTER — Other Ambulatory Visit: Payer: Self-pay | Admitting: Family Medicine

## 2013-09-15 ENCOUNTER — Other Ambulatory Visit: Payer: Self-pay | Admitting: Family Medicine

## 2013-09-26 NOTE — Telephone Encounter (Signed)
Number listed in chart is out of order.   Will send letter for patient to contact office upon receiving.

## 2013-09-29 ENCOUNTER — Encounter: Payer: Self-pay | Admitting: Family Medicine

## 2013-09-29 ENCOUNTER — Encounter (INDEPENDENT_AMBULATORY_CARE_PROVIDER_SITE_OTHER): Payer: Self-pay

## 2013-09-29 ENCOUNTER — Ambulatory Visit (INDEPENDENT_AMBULATORY_CARE_PROVIDER_SITE_OTHER): Payer: Medicare Other | Admitting: Family Medicine

## 2013-09-29 ENCOUNTER — Ambulatory Visit (HOSPITAL_COMMUNITY)
Admission: RE | Admit: 2013-09-29 | Discharge: 2013-09-29 | Disposition: A | Discharge status: Home / Self Care | Payer: Medicare Other | Source: Ambulatory Visit | Attending: Family Medicine | Admitting: Family Medicine

## 2013-09-29 ENCOUNTER — Telehealth (HOSPITAL_COMMUNITY): Payer: Self-pay | Admitting: Dietician

## 2013-09-29 VITALS — BP 132/72 | HR 88 | Resp 16 | Ht 61.5 in | Wt 114.4 lb

## 2013-09-29 DIAGNOSIS — R05 Cough: Secondary | ICD-10-CM | POA: Diagnosis not present

## 2013-09-29 DIAGNOSIS — J309 Allergic rhinitis, unspecified: Secondary | ICD-10-CM | POA: Diagnosis not present

## 2013-09-29 DIAGNOSIS — E1122 Type 2 diabetes mellitus with diabetic chronic kidney disease: Secondary | ICD-10-CM | POA: Insufficient documentation

## 2013-09-29 DIAGNOSIS — R5381 Other malaise: Secondary | ICD-10-CM

## 2013-09-29 DIAGNOSIS — N1831 Chronic kidney disease, stage 3a: Secondary | ICD-10-CM | POA: Insufficient documentation

## 2013-09-29 DIAGNOSIS — R053 Chronic cough: Secondary | ICD-10-CM | POA: Insufficient documentation

## 2013-09-29 DIAGNOSIS — E119 Type 2 diabetes mellitus without complications: Secondary | ICD-10-CM | POA: Diagnosis not present

## 2013-09-29 DIAGNOSIS — R059 Cough, unspecified: Secondary | ICD-10-CM

## 2013-09-29 DIAGNOSIS — N183 Chronic kidney disease, stage 3 (moderate): Secondary | ICD-10-CM

## 2013-09-29 DIAGNOSIS — E1065 Type 1 diabetes mellitus with hyperglycemia: Secondary | ICD-10-CM

## 2013-09-29 DIAGNOSIS — IMO0001 Reserved for inherently not codable concepts without codable children: Secondary | ICD-10-CM

## 2013-09-29 DIAGNOSIS — IMO0002 Reserved for concepts with insufficient information to code with codable children: Secondary | ICD-10-CM

## 2013-09-29 DIAGNOSIS — E11649 Type 2 diabetes mellitus with hypoglycemia without coma: Secondary | ICD-10-CM | POA: Insufficient documentation

## 2013-09-29 DIAGNOSIS — Z79899 Other long term (current) drug therapy: Secondary | ICD-10-CM

## 2013-09-29 DIAGNOSIS — E1165 Type 2 diabetes mellitus with hyperglycemia: Secondary | ICD-10-CM

## 2013-09-29 DIAGNOSIS — I10 Essential (primary) hypertension: Secondary | ICD-10-CM | POA: Diagnosis not present

## 2013-09-29 DIAGNOSIS — Z794 Long term (current) use of insulin: Secondary | ICD-10-CM | POA: Insufficient documentation

## 2013-09-29 DIAGNOSIS — R5383 Other fatigue: Secondary | ICD-10-CM

## 2013-09-29 LAB — CBC WITH DIFFERENTIAL/PLATELET
Basophils Absolute: 0 10*3/uL (ref 0.0–0.1)
Basophils Relative: 0 % (ref 0–1)
EOS ABS: 0.2 10*3/uL (ref 0.0–0.7)
EOS PCT: 3 % (ref 0–5)
HCT: 41 % (ref 36.0–46.0)
HEMOGLOBIN: 13.2 g/dL (ref 12.0–15.0)
LYMPHS ABS: 1.6 10*3/uL (ref 0.7–4.0)
LYMPHS PCT: 18 % (ref 12–46)
MCH: 29.4 pg (ref 26.0–34.0)
MCHC: 32.2 g/dL (ref 30.0–36.0)
MCV: 91.3 fL (ref 78.0–100.0)
Monocytes Absolute: 0.7 10*3/uL (ref 0.1–1.0)
Monocytes Relative: 8 % (ref 3–12)
Neutro Abs: 6 10*3/uL (ref 1.7–7.7)
Neutrophils Relative %: 71 % (ref 43–77)
PLATELETS: 284 10*3/uL (ref 150–400)
RBC: 4.49 MIL/uL (ref 3.87–5.11)
RDW: 13.3 % (ref 11.5–15.5)
WBC: 8.5 10*3/uL (ref 4.0–10.5)

## 2013-09-29 LAB — POCT URINALYSIS DIPSTICK
BILIRUBIN UA: NEGATIVE
GLUCOSE UA: 500
Ketones, UA: NEGATIVE
LEUKOCYTES UA: NEGATIVE
NITRITE UA: NEGATIVE
Protein, UA: NEGATIVE
RBC UA: NEGATIVE
Spec Grav, UA: 1.01
Urobilinogen, UA: 0.2
pH, UA: 7

## 2013-09-29 MED ORDER — PREDNISONE 5 MG PO TABS: 5.0000 mg | ORAL_TABLET | Freq: Two times a day (BID) | ORAL | Status: DC

## 2013-09-29 MED ORDER — INSULIN GLARGINE 100 UNIT/ML SOLOSTAR PEN: 12.0000 [IU] | PEN_INJECTOR | Freq: Every day | SUBCUTANEOUS | Status: DC

## 2013-09-29 MED ORDER — FLUTICASONE PROPIONATE 50 MCG/ACT NA SUSP: 2.0000 | Freq: Every day | NASAL | Status: DC

## 2013-09-29 MED ORDER — INSULIN ASPART 100 UNIT/ML ~~LOC~~ SOLN
10.0000 [IU] | Freq: Once | SUBCUTANEOUS | Status: AC
Start: 2013-09-29 — End: 2013-09-29
  Administered 2013-09-29: 10 [IU] via SUBCUTANEOUS

## 2013-09-29 NOTE — Telephone Encounter (Signed)
Received referral via fax from Dr. Moshe Cipro for dx: uncontrolled diabetes.

## 2013-09-29 NOTE — Patient Instructions (Addendum)
F/u in 10 days with meter and log book Your exam and symptoms today are consistent with uncontrolled allergies.  nose spray sent to pharmacy  OK to take sudafed one daily for runny nose  CXR today please   cMP and EGFr, CBc and diff today please  Accucheck in office today. Is 593, your blood suggar is uncontrolled you need to resume lantus start at 12 units every day, before breakfast. You will get short acting insulin in the office today 10 units, please drink about 8 glasses of water daily to help  Goal for fasting blood sugar ranges from 80 to 120 and 2 hours after any meal or at bedtime should be between 130 to 170. Call with concerns, youn eed individual diabetic class this week if possible, we will refer   Test blood sugar three times daily and reciord Reduce metformin to one daily due to c/o chronic nausea , loose stools and weight loss.  Pls start testing blood sugar  Every day, I may need to increase the glucotrol dose, I am concerned about your blood sugar  Jan 24 or after microalb, fasting lipid and HBA1C please

## 2013-09-29 NOTE — Progress Notes (Signed)
   Subjective:    Patient ID: Emily Jennings, female    DOB: [birth year], 80 y.o.   MRN: [id number]  HPI  Poor appetite, excessive dry mouth, weight loss, cough, periorbital pressure excess sneezing , feels dry all over, vaginal dryness. C/o excessive watery eyes and clear nasal drainage, no fever or chills Not testing blood sugars, c/o soreness in mouth, and ulcers , increased fatigue, and excessive thirst and urination in the past several weeks  Review of Systems See HPI Denies recent fever or chills. Denies sinus pressure,  ear pain or sore throat. Denies chest congestion,  or wheezing. Denies chest pains, palpitations and leg swelling Denies abdominal pain, nausea, vomiting,diarrhea or constipation.   Denies dysuria, frequency, hesitancy or incontinence. Denies joint pain, swelling and limitation in mobility. Denies headaches, seizures, numbness, or tingling. Denies depression,  Uncontrolled anxiety or insomnia. Denies skin break down or rash.        Objective:   Physical Exam BP 132/72  Pulse 88  Resp 16  Ht 5' 1.5" (1.562 m)  Wt 114 lb 6.4 oz (51.891 kg)  BMI 21.27 kg/m2  SpO2 97% Patient alert and oriented and in no cardiopulmonary distress.  HEENT: No facial asymmetry, EOMI, no sinus tenderness,  oropharynx pink and dry.  Neck supple no adenopathy.Nasal mucosa erythematous, clear nasal drainage  Chest: Clear to auscultation bilaterally.  CVS: S1, S2 no murmurs, no S3.  ABD: Soft non tender. Bowel sounds normal.  Ext: No edema  MS: Adequate ROM spine, shoulders, hips and knees.  Skin: Intact, no ulcerations or rash noted.  Psych: Good eye contact, normal affect. Memory intact not anxious or depressed appearing.  CNS: CN 2-12 intact, power, tone and sensation normal throughout.        Assessment & Plan:  Allergic rhinitis Uncontrolled, daily flonase and intermittent, short term  use of sudafed   HYPERTENSION Controlled, no change in medication DASH  diet and commitment to daily physical activity for a minimum of 30 minutes discussed and encouraged, as a part of hypertension management. The importance of attaining a healthy weight is also discussed.   Diabetes mellitus, insulin dependent (IDDM), uncontrolled Deteriorated,pt has been eating increased sweets and  Has not been testing blood sugar, office blood sugar markedly elevated, insulin administered, and she is to modify her behavior Patient advised to reduce carb and sweets, commit to regular physical activity, take meds as prescribed, test blood as directed, and attempt to lose weight, to improve blood sugar control. Updated lab needed at/ before next visit.    Chronic cough Worsened due to uncontrolled allergies  FATIGUE Increased , due to uncontrolled diabetes

## 2013-09-29 NOTE — Telephone Encounter (Signed)
Called and left message on voicemail at [facility].

## 2013-09-30 ENCOUNTER — Telehealth: Payer: Self-pay | Admitting: *Deleted

## 2013-09-30 LAB — COMPLETE METABOLIC PANEL WITH GFR
ALT: 24 U/L (ref 0–35)
AST: 22 U/L (ref 0–37)
Albumin: 4.3 g/dL (ref 3.5–5.2)
Alkaline Phosphatase: 96 U/L (ref 39–117)
BUN: 16 mg/dL (ref 6–23)
CALCIUM: 9.6 mg/dL (ref 8.4–10.5)
CO2: 31 mEq/L (ref 19–32)
CREATININE: 0.98 mg/dL (ref 0.50–1.10)
Chloride: 95 mEq/L — ABNORMAL LOW (ref 96–112)
GFR, Est African American: 64 mL/min
GFR, Est Non African American: 56 mL/min — ABNORMAL LOW
Glucose, Bld: 538 mg/dL (ref 70–99)
Potassium: 4.3 mEq/L (ref 3.5–5.3)
Sodium: 135 mEq/L (ref 135–145)
Total Bilirubin: 0.6 mg/dL (ref 0.3–1.2)
Total Protein: 6.8 g/dL (ref 6.0–8.3)

## 2013-09-30 NOTE — Telephone Encounter (Signed)
Pt called stating she would like her test strips called into [facility]. Please advise [phone]

## 2013-10-01 NOTE — Telephone Encounter (Signed)
Patient collected rx and took with her

## 2013-10-03 NOTE — Telephone Encounter (Signed)
No response from previous contact attempt. Sent letter to pt home via [employer] Mail in attempt to contact pt to schedule appointment.  

## 2013-10-08 NOTE — Telephone Encounter (Signed)
Pt left voicemail at [phone].

## 2013-10-08 NOTE — Telephone Encounter (Signed)
Called and left message at [phone].

## 2013-10-09 ENCOUNTER — Ambulatory Visit: Payer: Medicare Other | Admitting: Family Medicine

## 2013-10-09 ENCOUNTER — Ambulatory Visit (INDEPENDENT_AMBULATORY_CARE_PROVIDER_SITE_OTHER): Payer: Medicare Other | Admitting: Family Medicine

## 2013-10-09 ENCOUNTER — Encounter: Payer: Self-pay | Admitting: Family Medicine

## 2013-10-09 VITALS — BP 138/80 | HR 77 | Resp 16 | Ht 61.5 in | Wt 120.0 lb

## 2013-10-09 DIAGNOSIS — I10 Essential (primary) hypertension: Secondary | ICD-10-CM | POA: Diagnosis not present

## 2013-10-09 DIAGNOSIS — E119 Type 2 diabetes mellitus without complications: Secondary | ICD-10-CM | POA: Diagnosis not present

## 2013-10-09 DIAGNOSIS — E1165 Type 2 diabetes mellitus with hyperglycemia: Principal | ICD-10-CM

## 2013-10-09 DIAGNOSIS — IMO0002 Reserved for concepts with insufficient information to code with codable children: Secondary | ICD-10-CM

## 2013-10-09 DIAGNOSIS — IMO0001 Reserved for inherently not codable concepts without codable children: Secondary | ICD-10-CM

## 2013-10-09 DIAGNOSIS — E1065 Type 1 diabetes mellitus with hyperglycemia: Secondary | ICD-10-CM | POA: Diagnosis not present

## 2013-10-09 DIAGNOSIS — Z794 Long term (current) use of insulin: Principal | ICD-10-CM

## 2013-10-09 LAB — HEMOGLOBIN A1C
Hgb A1c MFr Bld: 10.8 % — ABNORMAL HIGH (ref <5.7)
MEAN PLASMA GLUCOSE: 263 mg/dL — AB (ref <117)

## 2013-10-09 LAB — GLUCOSE, POCT (MANUAL RESULT ENTRY): POC GLUCOSE: 106 mg/dL — AB (ref 70–99)

## 2013-10-09 NOTE — Telephone Encounter (Signed)
No further response from pt. Referral filed.

## 2013-10-09 NOTE — Patient Instructions (Signed)
F/u in  Early May call if you need me before  HBa1C, today  STOP metformin  Continue lantus 10 units every day and glipizide 5 mg one every day    Fasting lipid, cmp and EGFR and HBA1C, microalb in early May   Goal for pre breakfast sugar, 100 to 135, bedtime  140 to 180

## 2013-10-10 ENCOUNTER — Telehealth (HOSPITAL_COMMUNITY): Payer: Self-pay | Admitting: Dietician

## 2013-10-10 ENCOUNTER — Other Ambulatory Visit: Payer: Self-pay

## 2013-10-10 MED ORDER — INSULIN PEN NEEDLE 31G X 8 MM MISC: Status: DC

## 2013-10-10 NOTE — Telephone Encounter (Signed)
Received voicemail left at [phone].

## 2013-10-10 NOTE — Telephone Encounter (Signed)
Called at [phone]. Appointment scheduled for 10/17/13 at 1000.

## 2013-10-12 NOTE — Assessment & Plan Note (Signed)
Marked improvement on current regime with restricted diet, updated lab prior to next visit, no med change Patient advised to reduce carb and sweets, commit to regular physical activity, take meds as prescribed, test blood as directed, and attempt to lose weight, to improve blood sugar control.

## 2013-10-12 NOTE — Assessment & Plan Note (Signed)
Controlled, no change in medication DASH diet and commitment to daily physical activity for a minimum of 30 minutes discussed and encouraged, as a part of hypertension management. The importance of attaining a healthy weight is also discussed.  

## 2013-10-12 NOTE — Progress Notes (Signed)
   Subjective:    Patient ID: Emily Jennings, female    DOB: 30-Jun-1934, 80 y.o.   MRN: [id number]  HPI Pt in for close f/u of uncontrolled diabetes, now on insulin her blood sugars are much improved, fasting sugars are seldom over 130 and bedtime sugars the same C/o diarrheah with metformin states that is why she stopped thew drug will therefore change her to glipizide. Feels much better with nl sugar , denies polyuria , polydipsia or dry mouth   Review of Systems See HPI Denies recent fever or chills. Denies sinus pressure, nasal congestion, ear pain or sore throat. Denies chest congestion, productive cough or wheezing. Denies chest pains, palpitations and leg swelling  Denies skin break down or rash.        Objective:   Physical Exam Patient alert and oriented and in no cardiopulmonary distress.  HEENT: No facial asymmetry, EOMI, no sinus tenderness,  oropharynx pink and moist.  Neck supple no adenopathy.  Chest: Clear to auscultation bilaterally.  CVS: S1, S2 no murmurs, no S3.  ABD: Soft non tender. Bowel sounds normal.  Ext: No edema  MS: Adequate ROM spine, shoulders, hips and knees.  SCNS: CN 2-12 intact, power, tone and sensation normal throughout.        Assessment & Plan:

## 2013-10-17 ENCOUNTER — Encounter (HOSPITAL_COMMUNITY): Payer: Self-pay | Admitting: Dietician

## 2013-10-17 ENCOUNTER — Telehealth (HOSPITAL_COMMUNITY): Payer: Self-pay | Admitting: Dietician

## 2013-10-17 NOTE — Progress Notes (Signed)
Outpatient Initial Nutrition Assessment  Date:10/17/2013   Appt Start Time: 1100  Referring Physician: Dr. Moshe Cipro Reason for Visit: diabetes  Nutrition Assessment:  Height: 5' 1.5" (156.2 cm)   Weight: 117 lb (53.071 kg)   IBW: 108  %IBW: 108% UBW: 120#  %UBW: 98% Body mass index is 21.75 kg/(m^2).  Goal Weight: Maintenance Weight hx: Wt Readings from Last 10 Encounters:  10/17/13 117 lb (53.071 kg)  10/09/13 120 lb (54.432 kg)  09/29/13 114 lb 6.4 oz (51.891 kg)  07/08/13 123 lb 1.9 oz (55.847 kg)  03/03/13 123 lb 1.3 oz (55.829 kg)  01/27/13 125 lb 1.3 oz (56.736 kg)  01/10/13 123 lb (55.792 kg)  12/24/12 127 lb (57.607 kg)  12/10/12 127 lb (57.607 kg)  11/11/12 125 lb 6.4 oz (56.881 kg)    Estimated nutritional needs:  Kcals/ day: 1200-1400 Protein (grams)/day: 44-55 Fluid (L)/ day: 1.2-1.4  PMH:  Past Medical History  Diagnosis Date  . Anxiety   . Hypertension   . Allergy   . Hyperglycemia   . Low blood potassium   . Diabetes mellitus   . Cancer 2009    breast, carcinoma in situ left  . Osteoporosis   . Non-insulin dependent type 2 diabetes mellitus   . Coronary artery disease     cardiac catheterization on 03/20/2006  LAD mid 40% stenosis, left circumflex mild 40% stenosis, RCA mid-vessel 40% to 50% lesion   EF 60%  . Carotid stenosis     11/16/2005  mild plaque formation and stenosis proximal right ECA  . Ventricular tachycardia, non-sustained     developed during stress test 02/08/2006, spontaneously aborted, mild reversible apical defect  . Shortness of breath     2D Echocardiogram 01/26/2009   EF of greater than 55%, mild MR, mild TR, normal ventricular function  . Arthritis   . Cataract   . GERD (gastroesophageal reflux disease)     Medications:  Current Outpatient Rx  Name  Route  Sig  Dispense  Refill  . amLODipine (NORVASC) 10 MG tablet      TAKE 1 TABLET (10 MG TOTAL) BY MOUTH DAILY.   30 tablet   4   . B-D ULTRA-FINE 33 LANCETS MISC      USE TO TEST BLOOD SUGAR ONCE DAILY   100 each   PRN   . BAYER CONTOUR TEST test strip      USE TO TEST BLOOD SUGAR ONCE DAILY   50 each   PRN   . calcium-vitamin D (OSCAL WITH D) 500-200 MG-UNIT per tablet   Oral   Take 2 tablets by mouth daily with breakfast.         . cetirizine (ZYRTEC) 10 MG tablet   Oral   Take 10 mg by mouth daily.         . fluticasone (FLONASE) 50 MCG/ACT nasal spray   Each Nare   Place 2 sprays into both nostrils daily.   16 g   2   . Garlic 4580 MG CAPS   Oral   Take 2 capsules by mouth daily.         Marland Kitchen glipiZIDE (GLUCOTROL XL) 5 MG 24 hr tablet      TAKE ONE TABLET BY MOUTH EVERY DAY   30 tablet   4   . Insulin Glargine (LANTUS) 100 UNIT/ML Solostar Pen   Subcutaneous   Inject 12 Units into the skin daily.   15 mL   11   . Insulin Pen  Needle 31G X 8 MM MISC      Use to inject insulin   50 each   11   . losartan (COZAAR) 100 MG tablet      TAKE ONE TABLET BY MOUTH EVERY DAY   30 tablet   4   . metoprolol (LOPRESSOR) 50 MG tablet      TAKE ONE TABLET BY MOUTH TWICE DAILY   60 tablet   4   . Multiple Vitamins-Minerals (MULTI COMPLETE PO)   Oral   Take 1 tablet by mouth daily.           Marland Kitchen omeprazole (PRILOSEC) 20 MG capsule      TAKE ONE CAPSULE BY MOUTH ONCE DAILY FOR ACID   60 capsule   5   . potassium chloride SA (K-DUR,KLOR-CON) 20 MEQ tablet      TAKE ONE TABLET BY MOUTH TWICE DAILY   60 tablet   4   . pravastatin (PRAVACHOL) 10 MG tablet   Oral   Take 1 tablet (10 mg total) by mouth daily.   30 tablet   11   . spironolactone (ALDACTONE) 25 MG tablet      TAKE ONE TABLET BY MOUTH TWICE DAILY   60 tablet   4   . tamoxifen (NOLVADEX) 20 MG tablet   Oral   Take 20 mg by mouth daily.            Labs: CMP     Component Value Date/Time   NA 135 09/29/2013 1150   K 4.3 09/29/2013 1150   CL 95* 09/29/2013 1150   CO2 31 09/29/2013 1150   GLUCOSE 538* 09/29/2013 1150   BUN 16 09/29/2013 1150    CREATININE 0.98 09/29/2013 1150   CREATININE 0.89 12/31/2010 0411   CALCIUM 9.6 09/29/2013 1150   PROT 6.8 09/29/2013 1150   ALBUMIN 4.3 09/29/2013 1150   AST 22 09/29/2013 1150   ALT 24 09/29/2013 1150   ALKPHOS 96 09/29/2013 1150   BILITOT 0.6 09/29/2013 1150   GFRNONAA >60 12/31/2010 0411   GFRAA  Value: >60        The eGFR has been calculated using the MDRD equation. This calculation has not been validated in all clinical situations. eGFR's persistently <60 mL/min signify possible Chronic Kidney Disease. 12/31/2010 0411    Lipid Panel     Component Value Date/Time   CHOL 159 07/29/2012 1142   TRIG 59 07/29/2012 1142   HDL 65 07/29/2012 1142   CHOLHDL 2.4 07/29/2012 1142   VLDL 12 07/29/2012 1142   LDLCALC 82 07/29/2012 1142     Lab Results  Component Value Date   HGBA1C 10.8* 10/09/2013   HGBA1C 7.2* 07/07/2013   HGBA1C 7.8* 03/03/2013   Lab Results  Component Value Date   MICROALBUR 1.41 11/11/2012   LDLCALC 82 07/29/2012   CREATININE 0.98 09/29/2013     Lifestyle/ social habits: Ms. Mckamey resides in [place] with her husband, who is present with her today. She reports hyperglycemia during the holidays due to dietary indiscretion. She reports CBGs in the 28's at Dr. Griffin Dakin office. She states she was put on insulin 10 days ago. CBGs are now in the 170's and steadily going down per her report.   Nutrition hx/habits: Ms. Dalal reports that she would like to get her diabetes under control. Per her husband, her main issue is portion control. He reports "she knows what to do, but just doesn't do it for herself".  Ms. Harnden reports  that she is trying to get "on a schedule" but it is difficult for her. She reports that she would wake up in the late morning and stay up late and night. She know has a consistent meal pattern. She is trying to cut back on her sweets.   Diet recall: Breakfast: 1/2 cup oat meal with 1/2 tsp butter, bowl of peaches with walnuts, cup of black coffee, water; Lunch:  1/2 flatbread sub, 2 tbs chicken salad; Dinner: chicken drumstick, broccoli, green peas; Snacks: apple, 1/2 sandwich, peanut butter crackers; Beverages mainly consist of milk, water, black coffee, unsweetened tea  Nutrition Diagnosis: Inconsistent carbohydrate intake r/t disordered eating patter AEB Hgb A1c: 10.8.  Nutrition Intervention: Nutrition rx: 1300 Kcal NAS,  diabetic diet; 3 meals per day; one night time snack; low calorie beverages only; Physical activity sd tolerated  Education/Counseling Provided: Educated pt on principles of diabetic diet. Discussed carbohydrate metabolism in relation to diabetes. Educated pt on basic self-management principles including: signs and symptoms of hyperglycemia and hypoglycemia, goals for fasting and postprandial blood sugars, goals for Hgb A1c, importance of checking feet, importance of keeping PCP appointments, and foot care. Educated pt on plate method, portion sizes, and sources of carbohydrate. Discussed importance of regular meal pattern. Discussed importance of adding sources of whole grains to diet to improve glycemic control. Also encouraged to choose low fat dairy, lean meats, and whole fruits and vegetables more often. Discussed options of artificial sweeteners and encouraged pt to use which brand she liked best. Discussed nutritional content of foods commonly eaten and discussed healthier alternatives. Discussed importance of compliance to prevent further complications of disease. Educated pt on importance of physical activity (goal of at least 30 minutes 5 times per week) along with a healthy diet to achieve weight loss and glycemic goals. Encouraged slow, moderate weight loss of 1-2# per week, or 7-10% of current body weight. Provided "Diabetes and You" and "Carbohydrate Counting and Meal Planning" handouts. Used TeachBack to assess understanding.   Understanding, Motivation, Ability to Follow Recommendations: Expect fair compliance.   Monitoring  and Evaluation: Goals: 1) Hgb A1c < 7.0; 2) Physical activity as tolerated  Recommendations: 1) Continue to watch portions; 2) One starch per meal; 3) Low calorie beverages only; 4) Choose small pieces of fruit, such as clementines  F/U: PRN. Provided RD contact information.   Donnovan Stamour A. Jimmye Norman, RD, LDN 10/17/2013  Appt EndTime: 3716

## 2013-10-17 NOTE — Telephone Encounter (Signed)
Pt was a no-show for appointment scheduled for 10/17/2013 at 1000.

## 2013-10-21 DIAGNOSIS — B351 Tinea unguium: Secondary | ICD-10-CM | POA: Diagnosis not present

## 2013-10-21 DIAGNOSIS — M79609 Pain in unspecified limb: Secondary | ICD-10-CM | POA: Diagnosis not present

## 2013-10-26 NOTE — Assessment & Plan Note (Signed)
Increased , due to uncontrolled diabetes

## 2013-10-26 NOTE — Assessment & Plan Note (Signed)
Deteriorated,pt has been eating increased sweets and  Has not been testing blood sugar, office blood sugar markedly elevated, insulin administered, and she is to modify her behavior Patient advised to reduce carb and sweets, commit to regular physical activity, take meds as prescribed, test blood as directed, and attempt to lose weight, to improve blood sugar control. Updated lab needed at/ before next visit.

## 2013-10-26 NOTE — Assessment & Plan Note (Signed)
Controlled, no change in medication DASH diet and commitment to daily physical activity for a minimum of 30 minutes discussed and encouraged, as a part of hypertension management. The importance of attaining a healthy weight is also discussed.  

## 2013-10-26 NOTE — Assessment & Plan Note (Signed)
Uncontrolled, daily flonase and intermittent, short term  use of sudafed

## 2013-10-26 NOTE — Assessment & Plan Note (Signed)
Worsened due to uncontrolled allergies

## 2013-11-05 ENCOUNTER — Ambulatory Visit (INDEPENDENT_AMBULATORY_CARE_PROVIDER_SITE_OTHER): Payer: Medicare Other | Admitting: Family Medicine

## 2013-11-05 ENCOUNTER — Encounter (INDEPENDENT_AMBULATORY_CARE_PROVIDER_SITE_OTHER): Payer: Self-pay

## 2013-11-05 ENCOUNTER — Encounter: Payer: Self-pay | Admitting: Family Medicine

## 2013-11-05 VITALS — BP 120/74 | HR 84 | Temp 99.6°F | Resp 16 | Ht 61.5 in | Wt 118.0 lb

## 2013-11-05 DIAGNOSIS — J209 Acute bronchitis, unspecified: Secondary | ICD-10-CM

## 2013-11-05 DIAGNOSIS — B9689 Other specified bacterial agents as the cause of diseases classified elsewhere: Secondary | ICD-10-CM | POA: Diagnosis not present

## 2013-11-05 DIAGNOSIS — J019 Acute sinusitis, unspecified: Secondary | ICD-10-CM | POA: Diagnosis not present

## 2013-11-05 DIAGNOSIS — Z794 Long term (current) use of insulin: Secondary | ICD-10-CM

## 2013-11-05 DIAGNOSIS — J309 Allergic rhinitis, unspecified: Secondary | ICD-10-CM

## 2013-11-05 DIAGNOSIS — IMO0002 Reserved for concepts with insufficient information to code with codable children: Secondary | ICD-10-CM | POA: Diagnosis not present

## 2013-11-05 DIAGNOSIS — I10 Essential (primary) hypertension: Secondary | ICD-10-CM | POA: Diagnosis not present

## 2013-11-05 DIAGNOSIS — IMO0001 Reserved for inherently not codable concepts without codable children: Secondary | ICD-10-CM

## 2013-11-05 DIAGNOSIS — E1165 Type 2 diabetes mellitus with hyperglycemia: Secondary | ICD-10-CM

## 2013-11-05 DIAGNOSIS — E1065 Type 1 diabetes mellitus with hyperglycemia: Secondary | ICD-10-CM

## 2013-11-05 DIAGNOSIS — A499 Bacterial infection, unspecified: Secondary | ICD-10-CM

## 2013-11-05 DIAGNOSIS — J208 Acute bronchitis due to other specified organisms: Principal | ICD-10-CM

## 2013-11-05 MED ORDER — CEFTRIAXONE SODIUM 500 MG IJ SOLR
500.0000 mg | Freq: Once | INTRAMUSCULAR | Status: AC
  Administered 2013-11-05: 500 mg via INTRAMUSCULAR

## 2013-11-05 MED ORDER — IPRATROPIUM BROMIDE 0.02 % IN SOLN
0.5000 mg | Freq: Once | RESPIRATORY_TRACT | Status: AC
  Administered 2013-11-05: 0.5 mg via RESPIRATORY_TRACT

## 2013-11-05 MED ORDER — AZITHROMYCIN 250 MG PO TABS: ORAL_TABLET | ORAL | Status: DC

## 2013-11-05 MED ORDER — ALBUTEROL SULFATE (2.5 MG/3ML) 0.083% IN NEBU
2.5000 mg | INHALATION_SOLUTION | Freq: Once | RESPIRATORY_TRACT | Status: AC
  Administered 2013-11-05: 2.5 mg via RESPIRATORY_TRACT

## 2013-11-05 MED ORDER — PROMETHAZINE-DM 6.25-15 MG/5ML PO SYRP: ORAL_SOLUTION | ORAL | Status: DC

## 2013-11-05 MED ORDER — BENZONATATE 100 MG PO CAPS: 100.0000 mg | ORAL_CAPSULE | Freq: Two times a day (BID) | ORAL | Status: DC | PRN

## 2013-11-05 MED ORDER — ALBUTEROL SULFATE HFA 108 (90 BASE) MCG/ACT IN AERS: 1.0000 | INHALATION_SPRAY | Freq: Four times a day (QID) | RESPIRATORY_TRACT | Status: DC | PRN

## 2013-11-05 NOTE — Progress Notes (Signed)
   Subjective:    Patient ID: Emily Jennings, female    DOB: 02/22/1934, 80 y.o.   MRN: [id number]  HPI 1 week h/o worsening head and chest congestion, associated with  chills intermittently. Nasal drainage has thickened , and is yellowish green, and at times bloody. Sputum is thick and yellow. Denies  bilateral ear pressure, denies hearing loss and sore throat. Increasing fatigue , poor appetitie and sleep disturbed by cough. No improvement with OTC medication. States that for approx 1 week before she had been experiencing excessive allergy symptoms Blood sugars have remained good, fasting blood sugars generally under 110    Review of Systems See HPI  Denies chest pains, palpitations and leg swelling Denies abdominal pain, nausea, vomiting,diarrhea or constipation.   Denies dysuria, frequency, hesitancy or incontinence. Denies joint pain, swelling and limitation in mobility. Denies headaches, seizures, numbness, or tingling. Denies depression, anxiety or insomnia. Denies skin break down or rash.        Objective:   Physical Exam  BP 120/74  Pulse 84  Temp(Src) 99.6 F (37.6 C) (Oral)  Resp 16  Ht 5' 1.5" (1.562 m)  Wt 118 lb (53.524 kg)  BMI 21.94 kg/m2  SpO2 94% Patient alert and oriented and in no cardiopulmonary distress.  HEENT: No facial asymmetry, EOMI, maxillary  sinus tenderness,  oropharynx pink and moist.  Neck supple no adenopathy.TM clear   Chest: bilateral wheezes, decreased though adequate air entry, scattered crackles  CVS: S1, S2 no murmurs, no S3.  ABD: Soft non tender. Bowel sounds normal.  Ext: No edema  MS: Adequate ROM spine, shoulders, hips and knees.  Skin: Intact, no ulcerations or rash noted.  Psych: Good eye contact, normal affect. Memory intact not anxious or depressed appearing.  CNS: CN 2-12 intact, power, tone and sensation normal throughout.       Assessment & Plan:  Acute bacterial bronchitis Antibiotics and  decongestants prescribed medication also administered at office visit.     Acute sinusitis Antibiotic and decongestant prescribed.    Diabetes mellitus, insulin dependent (IDDM), uncontrolled Marked improvement based on patient reporting Updated lab needed at/ before next visit.   Allergic rhinitis Uncontrolled, needs to take medication daily , as prescribed  HYPERTENSION Controlled, no change in medication

## 2013-11-05 NOTE — Patient Instructions (Addendum)
F/u as before, call if worsening or no better in the next 3 to 5 days  You are treated for acute bronchitis, sinusitis and uncontrolled allergy symptoms. It is very important that you use your allergy medications, nasal spray and tablet EVERY day.  Medication x 4 have been sent to the pharmacy, please fill and take them all as directed.  Glad that blood sugar is well controled now

## 2013-11-06 DIAGNOSIS — J0100 Acute maxillary sinusitis, unspecified: Secondary | ICD-10-CM | POA: Insufficient documentation

## 2013-11-06 NOTE — Assessment & Plan Note (Signed)
Antibiotics and decongestants prescribed medication also administered at office visit.

## 2013-11-06 NOTE — Assessment & Plan Note (Signed)
Controlled, no change in medication  

## 2013-11-06 NOTE — Assessment & Plan Note (Signed)
Antibiotic and decongestant prescribed 

## 2013-11-06 NOTE — Assessment & Plan Note (Signed)
Marked improvement based on patient reporting Updated lab needed at/ before next visit.

## 2013-11-06 NOTE — Assessment & Plan Note (Signed)
Uncontrolled, needs to take medication daily , as prescribed

## 2013-11-07 ENCOUNTER — Other Ambulatory Visit: Payer: Self-pay

## 2013-11-07 DIAGNOSIS — J309 Allergic rhinitis, unspecified: Secondary | ICD-10-CM

## 2013-11-07 MED ORDER — METOPROLOL TARTRATE 50 MG PO TABS: ORAL_TABLET | ORAL | Status: DC

## 2013-11-07 MED ORDER — POTASSIUM CHLORIDE CRYS ER 20 MEQ PO TBCR: EXTENDED_RELEASE_TABLET | ORAL | Status: DC

## 2013-11-07 MED ORDER — AMLODIPINE BESYLATE 10 MG PO TABS: ORAL_TABLET | ORAL | Status: DC

## 2013-11-07 MED ORDER — FLUTICASONE PROPIONATE 50 MCG/ACT NA SUSP: 2.0000 | Freq: Every day | NASAL | Status: DC

## 2013-11-07 MED ORDER — LOSARTAN POTASSIUM 100 MG PO TABS: ORAL_TABLET | ORAL | Status: DC

## 2013-11-11 ENCOUNTER — Emergency Department (HOSPITAL_COMMUNITY)
Admission: EM | Admit: 2013-11-11 | Discharge: 2013-11-11 | Disposition: A | Discharge status: Home / Self Care | Payer: Medicare Other | Attending: Emergency Medicine | Admitting: Emergency Medicine

## 2013-11-11 ENCOUNTER — Emergency Department (HOSPITAL_COMMUNITY): Payer: Medicare Other

## 2013-11-11 ENCOUNTER — Encounter (HOSPITAL_COMMUNITY): Payer: Self-pay | Admitting: Emergency Medicine

## 2013-11-11 DIAGNOSIS — Z794 Long term (current) use of insulin: Secondary | ICD-10-CM | POA: Diagnosis not present

## 2013-11-11 DIAGNOSIS — M25539 Pain in unspecified wrist: Secondary | ICD-10-CM | POA: Diagnosis not present

## 2013-11-11 DIAGNOSIS — I2510 Atherosclerotic heart disease of native coronary artery without angina pectoris: Secondary | ICD-10-CM | POA: Diagnosis not present

## 2013-11-11 DIAGNOSIS — K219 Gastro-esophageal reflux disease without esophagitis: Secondary | ICD-10-CM | POA: Diagnosis not present

## 2013-11-11 DIAGNOSIS — S59909A Unspecified injury of unspecified elbow, initial encounter: Secondary | ICD-10-CM | POA: Diagnosis not present

## 2013-11-11 DIAGNOSIS — IMO0002 Reserved for concepts with insufficient information to code with codable children: Secondary | ICD-10-CM | POA: Insufficient documentation

## 2013-11-11 DIAGNOSIS — Z853 Personal history of malignant neoplasm of breast: Secondary | ICD-10-CM | POA: Insufficient documentation

## 2013-11-11 DIAGNOSIS — Z79899 Other long term (current) drug therapy: Secondary | ICD-10-CM | POA: Insufficient documentation

## 2013-11-11 DIAGNOSIS — I10 Essential (primary) hypertension: Secondary | ICD-10-CM | POA: Insufficient documentation

## 2013-11-11 DIAGNOSIS — Z9889 Other specified postprocedural states: Secondary | ICD-10-CM | POA: Diagnosis not present

## 2013-11-11 DIAGNOSIS — S6990XA Unspecified injury of unspecified wrist, hand and finger(s), initial encounter: Secondary | ICD-10-CM | POA: Insufficient documentation

## 2013-11-11 DIAGNOSIS — E876 Hypokalemia: Secondary | ICD-10-CM | POA: Insufficient documentation

## 2013-11-11 DIAGNOSIS — M79609 Pain in unspecified limb: Secondary | ICD-10-CM | POA: Diagnosis not present

## 2013-11-11 DIAGNOSIS — M129 Arthropathy, unspecified: Secondary | ICD-10-CM | POA: Diagnosis not present

## 2013-11-11 DIAGNOSIS — Z8659 Personal history of other mental and behavioral disorders: Secondary | ICD-10-CM | POA: Diagnosis not present

## 2013-11-11 DIAGNOSIS — M810 Age-related osteoporosis without current pathological fracture: Secondary | ICD-10-CM | POA: Insufficient documentation

## 2013-11-11 DIAGNOSIS — E119 Type 2 diabetes mellitus without complications: Secondary | ICD-10-CM | POA: Diagnosis not present

## 2013-11-11 DIAGNOSIS — Y92009 Unspecified place in unspecified non-institutional (private) residence as the place of occurrence of the external cause: Secondary | ICD-10-CM | POA: Insufficient documentation

## 2013-11-11 DIAGNOSIS — M79641 Pain in right hand: Secondary | ICD-10-CM

## 2013-11-11 DIAGNOSIS — Y9389 Activity, other specified: Secondary | ICD-10-CM | POA: Insufficient documentation

## 2013-11-11 DIAGNOSIS — W010XXA Fall on same level from slipping, tripping and stumbling without subsequent striking against object, initial encounter: Secondary | ICD-10-CM | POA: Insufficient documentation

## 2013-11-11 DIAGNOSIS — W19XXXA Unspecified fall, initial encounter: Secondary | ICD-10-CM

## 2013-11-11 MED ORDER — ASPIRIN 81 MG PO CHEW
324.0000 mg | CHEWABLE_TABLET | Freq: Once | ORAL | Status: AC
  Administered 2013-11-11: 162 mg via ORAL
  Filled 2013-11-11: qty 4

## 2013-11-11 NOTE — ED Notes (Signed)
Patient states she tripped in her house and landed on her right hand.  Patient c/o right hand pain.

## 2013-11-11 NOTE — ED Provider Notes (Signed)
CSN: [id number]     Arrival date & time 11/11/13  0041 History   First MD Initiated Contact with Patient 11/11/13 0226     Chief Complaint  Patient presents with  . Hand Injury     (Consider location/radiation/quality/duration/timing/severity/associated sxs/prior Treatment) HPI Comments: 80 yo female with right hand pain after tripping in her house PTA on her foot and hitting right hand.  No other injuries. No sxs prior to the fall.  Pt feels well otherwise. Pain with palpation.    Patient is a 80 y.o. female presenting with hand injury. The history is provided by the patient.  Hand Injury Associated symptoms: no back pain, no fever and no neck pain     Past Medical History  Diagnosis Date  . Anxiety   . Hypertension   . Allergy   . Hyperglycemia   . Low blood potassium   . Diabetes mellitus   . Cancer 2009    breast, carcinoma in situ left  . Osteoporosis   . Non-insulin dependent type 2 diabetes mellitus   . Coronary artery disease     cardiac catheterization on 03/20/2006  LAD mid 40% stenosis, left circumflex mild 40% stenosis, RCA mid-vessel 40% to 50% lesion   EF 60%  . Carotid stenosis     11/16/2005  mild plaque formation and stenosis proximal right ECA  . Ventricular tachycardia, non-sustained     developed during stress test 02/08/2006, spontaneously aborted, mild reversible apical defect  . Shortness of breath     2D Echocardiogram 01/26/2009   EF of greater than 55%, mild MR, mild TR, normal ventricular function  . Arthritis   . Cataract   . GERD (gastroesophageal reflux disease)    Past Surgical History  Procedure Laterality Date  . Cyst removed from left foot    . Breast lumpectomy      Left breast 2009   Family History  Problem Relation Age of Onset  . Hypertension Mother   . Hyperlipidemia Mother   . Cancer Father     pancreatic  . Colon cancer Father   . Heart disease Brother     bypass  . Heart disease Brother     bypass  . Arthritis    .  Asthma    . Diabetes    . Hypertension Brother   . Colon cancer Paternal Aunt   . Esophageal cancer Neg Hx   . Stomach cancer Neg Hx   . Rectal cancer Neg Hx    History  Substance Use Topics  . Smoking status: Never Smoker   . Smokeless tobacco: Never Used  . Alcohol Use: No   OB History   Grav Para Term Preterm Abortions TAB SAB Ect Mult Living                 Review of Systems  Constitutional: Negative for fever and chills.  Respiratory: Negative for shortness of breath.   Cardiovascular: Negative for chest pain.  Gastrointestinal: Negative for vomiting and abdominal pain.  Musculoskeletal: Negative for back pain, neck pain and neck stiffness.  Neurological: Negative for light-headedness and headaches.      Allergies  Benadryl; Citalopram; Metformin and related; and Tylenol  Home Medications   Current Outpatient Rx  Name  Route  Sig  Dispense  Refill  . albuterol (PROVENTIL HFA;VENTOLIN HFA) 108 (90 BASE) MCG/ACT inhaler   Inhalation   Inhale 1-2 puffs into the lungs every 6 (six) hours as needed for wheezing or shortness of  breath.   18 g   0   . amLODipine (NORVASC) 10 MG tablet      TAKE 1 TABLET (10 MG TOTAL) BY MOUTH DAILY.   90 tablet   1   . azithromycin (ZITHROMAX) 250 MG tablet      Two tablets on day one, then one tablet once daily for  4 days   6 tablet   0   . B-D ULTRA-FINE 33 LANCETS MISC      USE TO TEST BLOOD SUGAR ONCE DAILY   100 each   PRN   . BAYER CONTOUR TEST test strip      USE TO TEST BLOOD SUGAR ONCE DAILY   50 each   PRN   . benzonatate (TESSALON) 100 MG capsule   Oral   Take 1 capsule (100 mg total) by mouth 2 (two) times daily as needed for cough.   14 capsule   0   . calcium-vitamin D (OSCAL WITH D) 500-200 MG-UNIT per tablet   Oral   Take 2 tablets by mouth daily with breakfast.         . cetirizine (ZYRTEC) 10 MG tablet   Oral   Take 10 mg by mouth daily.         . fluticasone (FLONASE) 50 MCG/ACT  nasal spray   Each Nare   Place 2 sprays into both nostrils daily.   48 g   1   . Garlic [id number] MG CAPS   Oral   Take 2 capsules by mouth daily.         Marland Kitchen glipiZIDE (GLUCOTROL XL) 5 MG 24 hr tablet      TAKE ONE TABLET BY MOUTH EVERY DAY   30 tablet   4   . Insulin Glargine (LANTUS) 100 UNIT/ML Solostar Pen   Subcutaneous   Inject 12 Units into the skin daily.   15 mL   11   . Insulin Pen Needle 31G X 8 MM MISC      Use to inject insulin   50 each   11   . losartan (COZAAR) 100 MG tablet      TAKE ONE TABLET BY MOUTH EVERY DAY   90 tablet   1   . metoprolol (LOPRESSOR) 50 MG tablet      TAKE ONE TABLET BY MOUTH TWICE DAILY   180 tablet   1   . Multiple Vitamins-Minerals (MULTI COMPLETE PO)   Oral   Take 1 tablet by mouth daily.           Marland Kitchen omeprazole (PRILOSEC) 20 MG capsule      TAKE ONE CAPSULE BY MOUTH ONCE DAILY FOR ACID   60 capsule   5   . potassium chloride SA (K-DUR,KLOR-CON) 20 MEQ tablet      TAKE ONE TABLET BY MOUTH TWICE DAILY   180 tablet   1   . pravastatin (PRAVACHOL) 10 MG tablet   Oral   Take 1 tablet (10 mg total) by mouth daily.   30 tablet   11   . promethazine-dextromethorphan (PROMETHAZINE-DM) 6.25-15 MG/5ML syrup      One teaspoon at bedtime, for uncontrolled cough   180 mL   0   . spironolactone (ALDACTONE) 25 MG tablet      TAKE ONE TABLET BY MOUTH TWICE DAILY   60 tablet   4   . tamoxifen (NOLVADEX) 20 MG tablet   Oral   Take 20 mg by mouth daily.  BP 146/70  Pulse 72  Temp(Src) 98.3 F (36.8 C) (Oral)  Resp 18  Ht 5\' 1"  (1.549 m)  Wt 118 lb (53.524 kg)  BMI 22.31 kg/m2  SpO2 97% Physical Exam  Nursing note and vitals reviewed. Constitutional: She appears well-developed and well-nourished. No distress.  HENT:  Head: Normocephalic and atraumatic.  Neck: Normal range of motion. Neck supple.  Pulmonary/Chest: Effort normal.  Musculoskeletal: She exhibits tenderness. She exhibits no  edema.  Tender proximal thumb/ MCP, no focal snuff box tenderness, full rom wrist and fingers however painful, nv intact, mild distal radial tender, no step off    ED Course  Procedures (including critical care time) Labs Review Labs Reviewed - No data to display Imaging Review Dg Wrist Complete Right  11/11/2013   CLINICAL DATA:  Status post fall; right hand and wrist pain.  EXAM: RIGHT WRIST - COMPLETE 3+ VIEW  COMPARISON:  None.  FINDINGS: There is no evidence of fracture or dislocation. The carpal rows are intact, and demonstrate normal alignment. The joint spaces are preserved. Mild negative ulnar variance is noted.  No significant soft tissue abnormalities are seen.  IMPRESSION: No evidence of fracture or dislocation.   Electronically Signed   By: Garald Balding M.D.   On: 11/11/2013 02:50   Dg Hand Complete Right  11/11/2013   CLINICAL DATA:  Fall with right hand injury and pain.  EXAM: RIGHT HAND - COMPLETE 3+ VIEW  COMPARISON:  None.  FINDINGS: No fracture, subluxation or dislocation identified.  The joint spaces are within normal limits.  No focal bony lesions are present.  IMPRESSION: Negative.   Electronically Signed   By: Hassan Rowan M.D.   On: 11/11/2013 01:21    EKG Interpretation   None       MDM   Final diagnoses:  Right hand pain  Fall   Xrays reviewed, no acute fx. Mechanical fall. Fup outpt with ortho.  ASA for pain.  Results and differential diagnosis were discussed with the patient. Close follow up outpatient was discussed, patient comfortable with the plan.      Mariea Clonts, MD 11/11/13 [phone]

## 2013-11-11 NOTE — Discharge Instructions (Signed)
Use ice and over the counter pain medicines as needed.  If you were given medicines take as directed.  If you are on coumadin or contraceptives realize their levels and effectiveness is altered by many different medicines.  If you have any reaction (rash, tongues swelling, other) to the medicines stop taking and see a physician.   Please follow up as directed and return to the ER or see a physician for new or worsening symptoms.  Thank you.

## 2013-11-17 ENCOUNTER — Ambulatory Visit: Payer: Medicare Other | Admitting: Family Medicine

## 2013-11-18 ENCOUNTER — Other Ambulatory Visit: Payer: Self-pay

## 2013-11-18 MED ORDER — BD LANCET ULTRAFINE 33G MISC: Status: DC

## 2013-11-20 ENCOUNTER — Other Ambulatory Visit: Payer: Self-pay | Admitting: Family Medicine

## 2013-11-21 ENCOUNTER — Other Ambulatory Visit: Payer: Self-pay

## 2013-11-21 MED ORDER — BD LANCET ULTRAFINE 33G MISC: Status: DC

## 2013-11-27 ENCOUNTER — Telehealth: Payer: Self-pay | Admitting: Family Medicine

## 2013-11-27 MED ORDER — BD LANCET ULTRAFINE 33G MISC: Status: DC

## 2013-11-27 NOTE — Telephone Encounter (Signed)
LANCETS ON MED LIST SENT TO [facility] PER PT REQUEST

## 2013-12-30 DIAGNOSIS — B351 Tinea unguium: Secondary | ICD-10-CM | POA: Diagnosis not present

## 2013-12-30 DIAGNOSIS — E1149 Type 2 diabetes mellitus with other diabetic neurological complication: Secondary | ICD-10-CM | POA: Diagnosis not present

## 2014-01-21 ENCOUNTER — Ambulatory Visit: Payer: Medicare Other | Admitting: Family Medicine

## 2014-02-13 ENCOUNTER — Telehealth: Payer: Self-pay | Admitting: *Deleted

## 2014-02-13 ENCOUNTER — Other Ambulatory Visit: Payer: Self-pay

## 2014-02-13 MED ORDER — GLIPIZIDE ER 5 MG PO TB24: ORAL_TABLET | ORAL | Status: DC

## 2014-02-13 NOTE — Telephone Encounter (Signed)
Pt called wanting to get her blood sugar medication refilled per pt it is glipizide she needs it called [facility] the [facility] in [place].

## 2014-03-10 DIAGNOSIS — B351 Tinea unguium: Secondary | ICD-10-CM | POA: Diagnosis not present

## 2014-03-10 DIAGNOSIS — M79609 Pain in unspecified limb: Secondary | ICD-10-CM | POA: Diagnosis not present

## 2014-03-10 DIAGNOSIS — S90426A Blister (nonthermal), unspecified lesser toe(s), initial encounter: Secondary | ICD-10-CM | POA: Diagnosis not present

## 2014-03-10 DIAGNOSIS — S90829A Blister (nonthermal), unspecified foot, initial encounter: Secondary | ICD-10-CM | POA: Diagnosis not present

## 2014-03-19 ENCOUNTER — Ambulatory Visit (INDEPENDENT_AMBULATORY_CARE_PROVIDER_SITE_OTHER): Payer: Medicare Other | Admitting: Family Medicine

## 2014-03-19 ENCOUNTER — Encounter: Payer: Self-pay | Admitting: Family Medicine

## 2014-03-19 VITALS — BP 158/80 | HR 74 | Resp 16 | Wt 116.0 lb

## 2014-03-19 DIAGNOSIS — R5383 Other fatigue: Secondary | ICD-10-CM | POA: Diagnosis not present

## 2014-03-19 DIAGNOSIS — Z91199 Patient's noncompliance with other medical treatment and regimen due to unspecified reason: Secondary | ICD-10-CM

## 2014-03-19 DIAGNOSIS — E1129 Type 2 diabetes mellitus with other diabetic kidney complication: Secondary | ICD-10-CM

## 2014-03-19 DIAGNOSIS — IMO0001 Reserved for inherently not codable concepts without codable children: Secondary | ICD-10-CM

## 2014-03-19 DIAGNOSIS — N289 Disorder of kidney and ureter, unspecified: Secondary | ICD-10-CM

## 2014-03-19 DIAGNOSIS — Z79899 Other long term (current) drug therapy: Secondary | ICD-10-CM | POA: Diagnosis not present

## 2014-03-19 DIAGNOSIS — Z9119 Patient's noncompliance with other medical treatment and regimen: Secondary | ICD-10-CM

## 2014-03-19 DIAGNOSIS — E1121 Type 2 diabetes mellitus with diabetic nephropathy: Secondary | ICD-10-CM

## 2014-03-19 DIAGNOSIS — E1065 Type 1 diabetes mellitus with hyperglycemia: Secondary | ICD-10-CM | POA: Diagnosis not present

## 2014-03-19 DIAGNOSIS — R5381 Other malaise: Secondary | ICD-10-CM

## 2014-03-19 DIAGNOSIS — I10 Essential (primary) hypertension: Secondary | ICD-10-CM | POA: Diagnosis not present

## 2014-03-19 DIAGNOSIS — N183 Chronic kidney disease, stage 3 unspecified: Secondary | ICD-10-CM

## 2014-03-19 DIAGNOSIS — Z794 Long term (current) use of insulin: Principal | ICD-10-CM

## 2014-03-19 DIAGNOSIS — IMO0002 Reserved for concepts with insufficient information to code with codable children: Secondary | ICD-10-CM | POA: Diagnosis not present

## 2014-03-19 DIAGNOSIS — N049 Nephrotic syndrome with unspecified morphologic changes: Secondary | ICD-10-CM

## 2014-03-19 DIAGNOSIS — E1165 Type 2 diabetes mellitus with hyperglycemia: Principal | ICD-10-CM

## 2014-03-19 DIAGNOSIS — Z23 Encounter for immunization: Secondary | ICD-10-CM

## 2014-03-19 LAB — GLUCOSE, POCT (MANUAL RESULT ENTRY): POC GLUCOSE: 335 mg/dL — AB (ref 70–99)

## 2014-03-19 MED ORDER — AMLODIPINE BESYLATE 10 MG PO TABS: ORAL_TABLET | ORAL | Status: DC

## 2014-03-19 MED ORDER — LOSARTAN POTASSIUM 100 MG PO TABS: ORAL_TABLET | ORAL | Status: DC

## 2014-03-19 NOTE — Patient Instructions (Addendum)
F/u in  Early August, bring all medication, bring yopur log book and meter  Test and record 3 times daily, call with concerns Goal for fasting blood sugar ranges from 100 to 1140 and 2 hours after any meal or at bedtime should be between 140 to 180.   Need to resume glipizide and spironolactone, blood pressure and blood sugar are both high  Insulin 5 uniits in office today  Prevnar today  Microalb and foot exam today  HbA1c, LIPID, CMP AND egfR, Hba1c AND Tsh TODAY  You need to attend diabetic class which nurse will explain that we will start this  here  You are referred to [facility] due to uncontrolled chronic medical conditions esp diabetes. Moderate complexity, poor pt insight

## 2014-03-20 ENCOUNTER — Other Ambulatory Visit: Payer: Self-pay | Admitting: Family Medicine

## 2014-03-20 LAB — COMPLETE METABOLIC PANEL WITH GFR
ALK PHOS: 95 U/L (ref 39–117)
ALT: 25 U/L (ref 0–35)
AST: 26 U/L (ref 0–37)
Albumin: 4 g/dL (ref 3.5–5.2)
BUN: 21 mg/dL (ref 6–23)
CHLORIDE: 97 meq/L (ref 96–112)
CO2: 28 mEq/L (ref 19–32)
Calcium: 9.3 mg/dL (ref 8.4–10.5)
Creat: 1.26 mg/dL — ABNORMAL HIGH (ref 0.50–1.10)
GFR, EST AFRICAN AMERICAN: 47 mL/min — AB
GFR, EST NON AFRICAN AMERICAN: 41 mL/min — AB
Glucose, Bld: 334 mg/dL — ABNORMAL HIGH (ref 70–99)
POTASSIUM: 4.6 meq/L (ref 3.5–5.3)
Sodium: 133 mEq/L — ABNORMAL LOW (ref 135–145)
TOTAL PROTEIN: 6.1 g/dL (ref 6.0–8.3)
Total Bilirubin: 0.4 mg/dL (ref 0.2–1.2)

## 2014-03-20 LAB — LIPID PANEL
CHOL/HDL RATIO: 2.1 ratio
Cholesterol: 173 mg/dL (ref 0–200)
HDL: 83 mg/dL (ref >39)
LDL Cholesterol: 70 mg/dL (ref 0–99)
Triglycerides: 101 mg/dL (ref <150)
VLDL: 20 mg/dL (ref 0–40)

## 2014-03-20 LAB — TSH: TSH: 0.677 u[IU]/mL (ref 0.350–4.500)

## 2014-03-20 LAB — HEMOGLOBIN A1C
HEMOGLOBIN A1C: 10.5 % — AB (ref <5.7)
MEAN PLASMA GLUCOSE: 255 mg/dL — AB (ref <117)

## 2014-03-23 ENCOUNTER — Telehealth: Payer: Self-pay | Admitting: Family Medicine

## 2014-03-23 DIAGNOSIS — Z91199 Patient's noncompliance with other medical treatment and regimen due to unspecified reason: Secondary | ICD-10-CM | POA: Insufficient documentation

## 2014-03-23 DIAGNOSIS — IMO0002 Reserved for concepts with insufficient information to code with codable children: Secondary | ICD-10-CM | POA: Diagnosis not present

## 2014-03-23 DIAGNOSIS — Z9119 Patient's noncompliance with other medical treatment and regimen: Secondary | ICD-10-CM | POA: Insufficient documentation

## 2014-03-23 DIAGNOSIS — E1121 Type 2 diabetes mellitus with diabetic nephropathy: Secondary | ICD-10-CM | POA: Insufficient documentation

## 2014-03-23 DIAGNOSIS — N183 Chronic kidney disease, stage 3 (moderate): Secondary | ICD-10-CM

## 2014-03-23 DIAGNOSIS — E1065 Type 1 diabetes mellitus with hyperglycemia: Secondary | ICD-10-CM | POA: Diagnosis not present

## 2014-03-23 DIAGNOSIS — N1832 Chronic kidney disease, stage 3b: Secondary | ICD-10-CM | POA: Insufficient documentation

## 2014-03-23 LAB — MICROALBUMIN / CREATININE URINE RATIO
Creatinine, Urine: 115 mg/dL
Microalb Creat Ratio: 597 mg/g — ABNORMAL HIGH (ref 0.0–30.0)
Microalb, Ur: 68.65 mg/dL — ABNORMAL HIGH (ref 0.00–1.89)

## 2014-03-23 NOTE — Assessment & Plan Note (Signed)
Worsened with uncontrolled blood sugar, explained the association to pt in the hoipe that she will be more compliant with treatment

## 2014-03-23 NOTE — Telephone Encounter (Signed)
Pt called stating we have not [facility] [place] and the problem is we have not sent it and she does not know why. Pt is taking spironlactone Please advise [phone]

## 2014-03-23 NOTE — Assessment & Plan Note (Signed)
Uncontrolled and deteriorated. Refer to [facility] for help with diabetes management / also needs to attend diabetic class. New higher dose of glipizide 10mg  daily, and lantus 12 units daily, needs to test 3 times daily and return with log and meter in 4 to 5 weeks, call sooner with questions

## 2014-03-23 NOTE — Telephone Encounter (Signed)
pLS SCHED ov HERE FOR EARLY aUGUST, NONE IN SYSTEM. aLSO SHE IS REFERRED TO dR [name] RE UNCONTROLLED DIABETES, PLS FAX RECENT LABS AND ASK D FOR ASAP APPT , THANKS

## 2014-03-23 NOTE — Assessment & Plan Note (Signed)
Pt lasrt seen in January, missed scheduled appt 3 to 4 months later, stating that she "forgot". Also states her blood sugars have remained uncontrolled with wide fluctuations at home, but she didi not call or come in for help. Would benefit from endocrinologist managing her diabetes and will offer this as well as referring her for assistance through [facility] I will make adjustments to her meds until she is seen by endo

## 2014-03-23 NOTE — Assessment & Plan Note (Signed)
Pt needs to be more consistent with management of her iDDM which has deteriorated and her hTN She will be referred to a nephrologist at next visit. Help with her disease management is also needed and a referral is being made, she is also to attend class here

## 2014-03-23 NOTE — Progress Notes (Signed)
Subjective:    Patient ID: Emily Jennings, female    DOB: 14-Mar-1934, 80 y.o.   MRN: [id number]  HPI The PT is here for follow up and re-evaluation of chronic medical conditions, medication management and reviewCONCERNS ABOUT HER UNCONTROLLED BLOOD SUGAR of any available recent lab and radiology data.  Preventive health is updated, specifically  Cancer screening and Immunization.   . The PT denies any adverse reactions to current medications since the last visit.  STATES THAT BLOOD SUGARS CONTINUE TO FLUCTUATE A LOT AND REMAIN UNCONTROLLED, LOWS IN THE 60'S HIGHS OVER 300, FEELS FATIGUED AND TIRED WITH DRY MOUTH, ":FORGOT SOONER APPOINTMENT, AND DID NOT THINK SHE SHOULD CALL/COME IN WITH CONCERNS ABOUT HER UNCONTROLLED BLOOD SUGAR (despite the fact that she has been told this in thje past)    Review of Systems See HPI Denies recent fever or chills.c/o excessive fatigue Denies sinus pressure, nasal congestion, ear pain or sore throat. Denies chest congestion, productive cough or wheezing. Denies chest pains, palpitations and leg swelling Denies abdominal pain, nausea, vomiting,diarrhea or constipation.   Denies dysuria, frequency, hesitancy or incontinence. Denies uncontrolled  joint pain, swelling and limitation in mobility. Denies headaches, seizures, numbness, or tingling. Denies depression, uncontrolled  anxiety or insomnia. Denies skin break down or rash.        Objective:   Physical Exam  BP 158/80  Pulse 74  Resp 16  Wt 116 lb (52.617 kg)  SpO2 98% Patient alert and oriented and in no cardiopulmonary distress.  HEENT: No facial asymmetry, EOMI,   oropharynx pink and moist.  Neck supple no JVD, no mass.  Chest: Clear to auscultation bilaterally.  CVS: S1, S2 no murmurs, no S3.Regular rate.  ABD: Soft non tender.   Ext: No edema  MS: Adequate ROM spine, shoulders, hips and knees.  Skin: Intact, no ulcerations or rash noted.  Psych: Good eye contact, normal  affect. Memory intact not anxious or depressed appearing.  CNS: CN 2-12 intact, power,  normal throughout.no focal deficits noted.       Assessment & Plan:  Diabetes mellitus, insulin dependent (IDDM), uncontrolled Uncontrolled and deteriorated. Refer to [facility] for help with diabetes management / also needs to attend diabetic class. New higher dose of glipizide 10mg  daily, and lantus 12 units daily, needs to test 3 times daily and return with log and meter in 4 to 5 weeks, call sooner with questions  HYPERTENSION Uncontrolled, pt to resume spironolactone DASH diet and commitment to daily physical activity for a minimum of 30 minutes discussed and encouraged, as a part of hypertension management. The importance of attaining a healthy weight is also discussed.   Diabetic hypertension-nephrosis syndrome Deteriorated in past 12 monhts. Improved control of BP and diabetes needed  FATIGUE Worsened with uncontrolled blood sugar, explained the association to pt in the hoipe that she will be more compliant with treatment  CKD (chronic kidney disease) stage 3, GFR 30-59 ml/min Pt needs to be more consistent with management of her iDDM which has deteriorated and her hTN She will be referred to a nephrologist at next visit. Help with her disease management is also needed and a referral is being made, she is also to attend class here  Medical non-compliance Pt lasrt seen in January, missed scheduled appt 3 to 4 months later, stating that she "forgot". Also states her blood sugars have remained uncontrolled with wide fluctuations at home, but she didi not call or come in for help. Would benefit from endocrinologist  managing her diabetes and will offer this as well as referring her for assistance through [facility] I will make adjustments to her meds until she is seen by endo

## 2014-03-23 NOTE — Assessment & Plan Note (Signed)
Uncontrolled, pt to resume spironolactone DASH diet and commitment to daily physical activity for a minimum of 30 minutes discussed and encouraged, as a part of hypertension management. The importance of attaining a healthy weight is also discussed.

## 2014-03-23 NOTE — Assessment & Plan Note (Signed)
Deteriorated in past 12 monhts. Improved control of BP and diabetes needed

## 2014-03-24 DIAGNOSIS — Z23 Encounter for immunization: Secondary | ICD-10-CM | POA: Diagnosis not present

## 2014-03-24 DIAGNOSIS — IMO0002 Reserved for concepts with insufficient information to code with codable children: Secondary | ICD-10-CM | POA: Diagnosis not present

## 2014-03-24 DIAGNOSIS — E1065 Type 1 diabetes mellitus with hyperglycemia: Secondary | ICD-10-CM | POA: Diagnosis not present

## 2014-03-24 MED ORDER — INSULIN ASPART 100 UNIT/ML ~~LOC~~ SOLN
5.0000 [IU] | Freq: Once | SUBCUTANEOUS | Status: AC
  Administered 2014-03-24: 5 [IU] via SUBCUTANEOUS

## 2014-03-24 NOTE — Addendum Note (Signed)
Addended by: Eual Fines on: 03/24/2014 10:52 AM   Modules accepted: Orders

## 2014-03-24 NOTE — Addendum Note (Signed)
Addended by: Eual Fines on: 03/24/2014 07:54 AM   Modules accepted: Orders

## 2014-03-25 ENCOUNTER — Other Ambulatory Visit: Payer: Self-pay

## 2014-03-25 DIAGNOSIS — IMO0001 Reserved for inherently not codable concepts without codable children: Secondary | ICD-10-CM

## 2014-03-25 DIAGNOSIS — E1165 Type 2 diabetes mellitus with hyperglycemia: Principal | ICD-10-CM

## 2014-03-25 DIAGNOSIS — Z794 Long term (current) use of insulin: Principal | ICD-10-CM

## 2014-03-25 MED ORDER — GLIPIZIDE ER 10 MG PO TB24: 10.0000 mg | ORAL_TABLET | Freq: Every day | ORAL | Status: DC

## 2014-03-25 NOTE — Addendum Note (Signed)
Addended by: Eual Fines on: 03/25/2014 03:24 PM   Modules accepted: Orders

## 2014-03-27 ENCOUNTER — Other Ambulatory Visit: Payer: Self-pay

## 2014-03-27 MED ORDER — SPIRONOLACTONE 25 MG PO TABS: ORAL_TABLET | ORAL | Status: DC

## 2014-04-02 ENCOUNTER — Telehealth: Payer: Self-pay

## 2014-04-02 NOTE — Telephone Encounter (Signed)
Needs to start lantus 15 units today, then go up to 18 uinits next week Monday if stioll too high, also needs to call back next week if needed, and needs to attends our class

## 2014-04-02 NOTE — Telephone Encounter (Signed)
Patient aware.  Will increase lantus starting tomorrow and then will increase to 18 on Tuesday 7/21

## 2014-04-27 ENCOUNTER — Encounter: Payer: Self-pay | Admitting: Family Medicine

## 2014-04-27 ENCOUNTER — Ambulatory Visit (INDEPENDENT_AMBULATORY_CARE_PROVIDER_SITE_OTHER): Payer: Medicare Other | Admitting: Family Medicine

## 2014-04-27 ENCOUNTER — Encounter (INDEPENDENT_AMBULATORY_CARE_PROVIDER_SITE_OTHER): Payer: Self-pay

## 2014-04-27 VITALS — BP 136/80 | HR 64 | Resp 18 | Ht 61.5 in | Wt 116.0 lb

## 2014-04-27 DIAGNOSIS — I10 Essential (primary) hypertension: Secondary | ICD-10-CM | POA: Diagnosis not present

## 2014-04-27 DIAGNOSIS — IMO0002 Reserved for concepts with insufficient information to code with codable children: Secondary | ICD-10-CM | POA: Diagnosis not present

## 2014-04-27 DIAGNOSIS — E1065 Type 1 diabetes mellitus with hyperglycemia: Secondary | ICD-10-CM | POA: Diagnosis not present

## 2014-04-27 DIAGNOSIS — IMO0001 Reserved for inherently not codable concepts without codable children: Secondary | ICD-10-CM

## 2014-04-27 DIAGNOSIS — E1165 Type 2 diabetes mellitus with hyperglycemia: Principal | ICD-10-CM

## 2014-04-27 DIAGNOSIS — Z794 Long term (current) use of insulin: Principal | ICD-10-CM

## 2014-04-27 LAB — GLUCOSE, POCT (MANUAL RESULT ENTRY): POC Glucose: 129 mg/dl — AB (ref 70–99)

## 2014-04-27 MED ORDER — GLIPIZIDE ER 10 MG PO TB24: 10.0000 mg | ORAL_TABLET | Freq: Every day | ORAL | Status: DC

## 2014-04-27 NOTE — Progress Notes (Signed)
   Subjective:    Patient ID: Emily Jennings, female    DOB: 1933-12-22, 80 y.o.   MRN: [id number]  HPI Pt in for  Re evaluation of uncontroled IDDM Had been testing at least once daily, often twice, unfortunately blood suaras continue to vary a lot for hig 50's , to over 200. Overall better but a lot of room for improvement.Feels badly when sugar is low, I remind her that blood sugar which is too low can put  Her in a coma She has been out of glipizide for 1 week, and remains uncertain of appropriate dose and has been taking up to 18 units lantus , when the prescribed dose is 10    Review of Systems See HPI Denies recent fever or chills. Denies sinus pressure, nasal congestion, ear pain or sore throat. Denies chest congestion, productive cough or wheezing.  Denies skin break down or rash.        Objective:   Physical Exam  BP 136/80  Pulse 64  Resp 18  Ht 5' 1.5" (1.562 m)  Wt 116 lb (52.617 kg)  BMI 21.57 kg/m2  SpO2 99% Patient alert and oriented and in no cardiopulmonary distress.  HEENT: No facial asymmetry, EOMI,   oropharynx pink and moist.  Neck supple no JVD, no mass.  Chest: Clear to auscultation bilaterally.  CVS: S1, S2 no murmurs, no S3.Regular rate.  ABD: Soft non tender.   Ext: No edema  .       Assessment & Plan:  Diabetes mellitus, insulin dependent (IDDM), uncontrolled Improved, but still uncontrolled, pt still non compliant with medications as well as with diet. Has visiting in nurse in the am coming to her home, states she "knows what to do" so uncertain of the need ,I gently reminded her , that she should happily take all the help she can get till blood sugaar is controlled. Already compliance and understanding remains a concern Patient advised to reduce carb and sweets, commit to regular physical activity, take meds as prescribed, test blood as directed, and attempt to lose weight, to improve blood sugar control. Updated lab needed at/ before  next visit.   HYPERTENSION Controlled, no change in medication DASH diet and commitment to daily physical activity for a minimum of 30 minutes discussed and encouraged, as a part of hypertension management. The importance of attaining a healthy weight is also discussed.

## 2014-04-27 NOTE — Patient Instructions (Addendum)
F/u for annual wellness October 5 or after, call if you need me  Before  Call for flu vaccine in next 2 to 3 weeks  Take every morning at breakfast glucotrol Er 100m ONE tablet  Take every day at lunchtime lantus 10 units.  Call in if blood sugars are too high or too low.  Your blood sugar should nOT BE Less than 80 and NOT BE more than 180  HBA1C and chem 7 and EGFR October 2 or after, but at least 3 days before next apppt  BP and cholesterol are excellent

## 2014-04-27 NOTE — Assessment & Plan Note (Signed)
Controlled, no change in medication DASH diet and commitment to daily physical activity for a minimum of 30 minutes discussed and encouraged, as a part of hypertension management. The importance of attaining a healthy weight is also discussed.  

## 2014-04-27 NOTE — Assessment & Plan Note (Signed)
Improved, but still uncontrolled, pt still non compliant with medications as well as with diet. Has visiting in nurse in the am coming to her home, states she "knows what to do" so uncertain of the need ,I gently reminded her , that she should happily take all the help she can get till blood sugaar is controlled. Already compliance and understanding remains a concern Patient advised to reduce carb and sweets, commit to regular physical activity, take meds as prescribed, test blood as directed, and attempt to lose weight, to improve blood sugar control. Updated lab needed at/ before next visit.

## 2014-05-19 ENCOUNTER — Encounter: Payer: Self-pay | Admitting: Family Medicine

## 2014-05-19 ENCOUNTER — Encounter (INDEPENDENT_AMBULATORY_CARE_PROVIDER_SITE_OTHER): Payer: Self-pay

## 2014-05-19 ENCOUNTER — Ambulatory Visit (HOSPITAL_COMMUNITY)
Admission: RE | Admit: 2014-05-19 | Discharge: 2014-05-19 | Disposition: A | Discharge status: Home / Self Care | Payer: Medicare Other | Source: Ambulatory Visit | Attending: Family Medicine | Admitting: Family Medicine

## 2014-05-19 ENCOUNTER — Ambulatory Visit (INDEPENDENT_AMBULATORY_CARE_PROVIDER_SITE_OTHER): Payer: Medicare Other | Admitting: Family Medicine

## 2014-05-19 VITALS — BP 108/68 | HR 66 | Resp 18 | Ht 61.5 in | Wt 115.1 lb

## 2014-05-19 DIAGNOSIS — M7120 Synovial cyst of popliteal space [Baker], unspecified knee: Secondary | ICD-10-CM | POA: Insufficient documentation

## 2014-05-19 DIAGNOSIS — M25569 Pain in unspecified knee: Secondary | ICD-10-CM | POA: Insufficient documentation

## 2014-05-19 DIAGNOSIS — M79609 Pain in unspecified limb: Secondary | ICD-10-CM | POA: Diagnosis not present

## 2014-05-19 DIAGNOSIS — IMO0001 Reserved for inherently not codable concepts without codable children: Secondary | ICD-10-CM

## 2014-05-19 DIAGNOSIS — M25571 Pain in right ankle and joints of right foot: Secondary | ICD-10-CM

## 2014-05-19 DIAGNOSIS — M79661 Pain in right lower leg: Secondary | ICD-10-CM

## 2014-05-19 DIAGNOSIS — E1065 Type 1 diabetes mellitus with hyperglycemia: Secondary | ICD-10-CM

## 2014-05-19 DIAGNOSIS — Z23 Encounter for immunization: Secondary | ICD-10-CM

## 2014-05-19 DIAGNOSIS — M25579 Pain in unspecified ankle and joints of unspecified foot: Secondary | ICD-10-CM | POA: Diagnosis not present

## 2014-05-19 DIAGNOSIS — Z794 Long term (current) use of insulin: Secondary | ICD-10-CM

## 2014-05-19 DIAGNOSIS — IMO0002 Reserved for concepts with insufficient information to code with codable children: Secondary | ICD-10-CM

## 2014-05-19 DIAGNOSIS — E1165 Type 2 diabetes mellitus with hyperglycemia: Secondary | ICD-10-CM

## 2014-05-19 DIAGNOSIS — M25561 Pain in right knee: Secondary | ICD-10-CM

## 2014-05-19 DIAGNOSIS — I10 Essential (primary) hypertension: Secondary | ICD-10-CM

## 2014-05-19 NOTE — Progress Notes (Signed)
   Subjective:    Patient ID: Emily Jennings, female    DOB: 09-01-1934, 80 y.o.   MRN: [id number]  HPI 4 day h/o increased right knee pain and swelling, states she nearly fell while going down a step and landed on her buttock C/o right calf pain and swelling since then, also c/o right ankle pain States blood sugars are always 300 and more on 10 units of insulin, has not called in    Review of Systems See HPI Denies recent fever or chills. Denies sinus pressure, nasal congestion, ear pain or sore throat. Denies chest congestion, productive cough or wheezing. Denies chest pains, palpitations and leg swelling Denies abdominal pain, nausea, vomiting,diarrhea or constipation.   Denies dysuria, frequency, hesitancy or incontinence.  Denies headaches, seizures, numbness, or tingling. Denies depression, anxiety or insomnia. Denies skin break down or rash.        Objective:   Physical Exam BP 108/68  Pulse 66  Resp 18  Ht 5' 1.5" (1.562 m)  Wt 115 lb 1.9 oz (52.218 kg)  BMI 21.40 kg/m2  SpO2 97% Patient alert and oriented and in no cardiopulmonary distress.  HEENT: No facial asymmetry, EOMI,   oropharynx pink and moist.  Neck supple no JVD, no mass.  Chest: Clear to auscultation bilaterally.  CVS: S1, S2 no murmurs, no S3.Regular rate.  ABD: Soft non tender.   Ext: No edema  MS: Adequate ROM spine, shoulders, decreased in right knee, which is swollen Right calf tender and swollen Skin: Intact, no ulcerations or rash noted.  Psych: Good eye contact, normal affect. Memory intact not anxious or depressed appearing.  CNS: CN 2-12 intact, power,  normal throughout.no focal deficits noted.        Assessment & Plan:  Knee pain Right knee paina and swelling , near fall 4 days ago Doppler shows large popliteal cyst, will refer to ortho  Right calf pain Acute right calf pain and tenderness, venous doppler  Today to r/o DVt  Diabetes mellitus, insulin dependent (IDDM),  uncontrolled Uncontrolled, needs to call in to nurse in 1 week. Lantus dose increased  Has also been referred to endo  HYPERTENSION Controlled, no change in medication   Need for prophylactic vaccination and inoculation against influenza Vaccine administered at visit.

## 2014-05-19 NOTE — Assessment & Plan Note (Addendum)
Right knee paina and swelling , near fall 4 days ago Doppler shows large popliteal cyst, will refer to ortho

## 2014-05-19 NOTE — Patient Instructions (Addendum)
F/u as before  You need to get an [place] of your right calf, we are arranging this You are referred to Dr Aline Brochure to evaluate your right knee and right ankle   START 15 units lantus today, call in to nurse Velna Hatchet) next week Tuesday with  blood sugar results please  Be careful not to fall  Flu vaccine today

## 2014-05-24 DIAGNOSIS — Z23 Encounter for immunization: Secondary | ICD-10-CM | POA: Insufficient documentation

## 2014-05-24 NOTE — Assessment & Plan Note (Signed)
Vaccine administered at visit.  

## 2014-05-24 NOTE — Assessment & Plan Note (Signed)
Controlled, no change in medication  

## 2014-05-24 NOTE — Assessment & Plan Note (Signed)
Uncontrolled, needs to call in to nurse in 1 week. Lantus dose increased  Has also been referred to endo

## 2014-05-24 NOTE — Assessment & Plan Note (Signed)
Acute right calf pain and tenderness, venous doppler  Today to r/o DVt

## 2014-05-26 ENCOUNTER — Telehealth: Payer: Self-pay

## 2014-05-26 NOTE — Telephone Encounter (Signed)
Pls let her know these numbers look much better, keep avoiding the sweets, no change in her lantus dose  Call back next week

## 2014-05-26 NOTE — Telephone Encounter (Signed)
Patients fasting sugars have been 101, 219, 130, 204, 115 and she does notice that sometimes when her sugars are high that it is because she has ate something sweet the evening before. Is taking 14 units of lantus in the am

## 2014-05-27 NOTE — Telephone Encounter (Signed)
Patient aware.

## 2014-05-28 ENCOUNTER — Other Ambulatory Visit: Payer: Self-pay | Admitting: Family Medicine

## 2014-06-02 ENCOUNTER — Telehealth: Payer: Self-pay

## 2014-06-02 NOTE — Telephone Encounter (Signed)
Patient aware.

## 2014-06-02 NOTE — Telephone Encounter (Signed)
Taking 14 units of insulin daily. ALSO- has been taking 1 tsp of organic vinegar every night. Fasting readings  12th- 246 13th- 265 14th- 272 15th- 75 - took 2 tsp of organic vinegar the night before and woke up to a 75 sugar reading   Asked about eating times and tries to eat same time daily and told me everything she was eating and all are low in carbs and sweets. A lot of cabbage, and vegetables

## 2014-06-02 NOTE — Telephone Encounter (Signed)
Since most of her reading are over 200, I would advise just the one tsp of vinegar, to keep things on the same level, and increase her  Insulin to 18 units daily.(I do not believe that 1 extra teaspoon of vinegar will do what several units of insulin will not do, so to avoid "confusion") Let her know you will call back with any dose changes on 06/15/2014

## 2014-06-04 ENCOUNTER — Other Ambulatory Visit: Payer: Self-pay | Admitting: Family Medicine

## 2014-06-10 ENCOUNTER — Telehealth: Payer: Self-pay | Admitting: Family Medicine

## 2014-06-10 MED ORDER — POTASSIUM CHLORIDE CRYS ER 20 MEQ PO TBCR: EXTENDED_RELEASE_TABLET | ORAL | Status: DC

## 2014-06-10 NOTE — Telephone Encounter (Signed)
Sent in

## 2014-06-11 ENCOUNTER — Encounter: Payer: Self-pay | Admitting: Orthopedic Surgery

## 2014-06-11 ENCOUNTER — Ambulatory Visit (INDEPENDENT_AMBULATORY_CARE_PROVIDER_SITE_OTHER): Payer: Medicare Other | Admitting: Orthopedic Surgery

## 2014-06-11 ENCOUNTER — Ambulatory Visit (INDEPENDENT_AMBULATORY_CARE_PROVIDER_SITE_OTHER): Payer: Medicare Other

## 2014-06-11 VITALS — BP 153/78 | Ht 61.5 in | Wt 115.1 lb

## 2014-06-11 DIAGNOSIS — M25569 Pain in unspecified knee: Secondary | ICD-10-CM | POA: Diagnosis not present

## 2014-06-11 DIAGNOSIS — IMO0002 Reserved for concepts with insufficient information to code with codable children: Secondary | ICD-10-CM

## 2014-06-11 DIAGNOSIS — M5410 Radiculopathy, site unspecified: Secondary | ICD-10-CM | POA: Insufficient documentation

## 2014-06-11 DIAGNOSIS — M25561 Pain in right knee: Secondary | ICD-10-CM

## 2014-06-11 DIAGNOSIS — M1711 Unilateral primary osteoarthritis, right knee: Secondary | ICD-10-CM | POA: Insufficient documentation

## 2014-06-11 DIAGNOSIS — M1710 Unilateral primary osteoarthritis, unspecified knee: Secondary | ICD-10-CM

## 2014-06-11 MED ORDER — GABAPENTIN 100 MG PO CAPS: ORAL_CAPSULE | ORAL | Status: DC

## 2014-06-11 NOTE — Progress Notes (Signed)
New   Subjective:    Emily Jennings is a 80 y.o. female who presents with knee pain involving Right knee with radicular pain down the right leg. Her knee pain is medial her radicular symptoms are and to the right foot. She has a history of degenerative disc disease which was not treated surgically. Onset was 2 months ago. Inciting event: none known. Current symptoms include: pain located Medial joint, stiffness and swelling, along with severe radiating pain down the right leg. Pain is aggravated by any weight bearing, standing and walking. Patient has had no prior knee problems. Evaluation to date: none. Treatment to date: Bayer aspirin and blue emu cream.  The following portions of the patient's history were reviewed and updated as appropriate: allergies, current medications, past family history, past medical history, past social history, past surgical history and problem list.   Review of Systems Pertinent items are noted in HPI.    Night sweats, sinus problems, ankle leg swelling, frequent urination, frequent thirst, anxiety, seasonal allergy all other systems were negative  Medical history diabetes hypertension and cancer  She had breast surgery in 2009  No allergies  Family history hypertension heart disease with cancer. Does not smoke or drink. Objective:    BP 153/78  Ht 5' 1.5" (1.562 m)  Wt 115 lb 1.9 oz (52.218 kg)  BMI 21.40 kg/m2 GENERAL ORIENTATION MOOD  UPPEREXTREMITIES: Are normal  Right knee: positive exam findings: crepitus and medial joint line tenderness and negative exam findings: no effusion, no erythema, ACL stable, PCL stable, MCL stable, LCL stable, no patellar laxity, McMurray's negative and FROM  Left knee:  normal and no effusion, full active range of motion, no joint line tenderness, ligamentous structures intact.   L-spine evaluation shows that the skin is normal but she is tender at L5-S1 L4-L5 and the right SI joint. She has decreased range of motion  of the spine muscle tone in the lumbar area is normal. Stability tests were deferred no swelling was noted SKIN normal  CV normal except for mild edema bilaterally  LYMPH negative in both legs  SENSATION decreased sensation on the right lateral anterior compartment of the leg consistent with L5 root issue left-sided normal  DTR 1+ at the knees 0 at the ankles  COORDINATION BALANCE normal   X-ray right knee: shows DJD changes, likely chronic    Assessment:    Right Osteoarthritis of the knee with radicular symptoms from her back pain and back problem    Plan:    I injected the knee for the arthritis and I started her on gabapentin for her radicular symptoms   Procedure note right knee injection verbal consent was obtained to inject right knee joint  Timeout was completed to confirm the site of injection  The medications used were 40 mg of Depo-Medrol and 1% lidocaine 3 cc  Anesthesia was provided by ethyl chloride and the skin was prepped with alcohol.  After cleaning the skin with alcohol a 20-gauge needle was used to inject the right knee joint. There were no complications. A sterile bandage was applied.   Meds ordered this encounter  Medications  . gabapentin (NEURONTIN) 100 MG capsule    Sig: Two tabs by mouth at bedtime    Dispense:  60 capsule    Refill:  2

## 2014-06-25 ENCOUNTER — Telehealth: Payer: Self-pay

## 2014-06-25 NOTE — Telephone Encounter (Signed)
pls let her know and send in keflex 500 mg one twice daily for 5 days only, if not improving will need to call back and get gyn referral to open the cyst, call [place] next Monday, if worse over the weekend will need to go to urgent care or ED for attention

## 2014-06-25 NOTE — Telephone Encounter (Signed)
Received call from Emily Jennings.  She states that area in left groin is hard and the size of a small egg.  No signs of skin being broken.  She is requesting that the area be evaluated.

## 2014-06-25 NOTE — Telephone Encounter (Signed)
Patient aware and appointment scheduled.  

## 2014-06-25 NOTE — Telephone Encounter (Addendum)
Work in at 8 15 am as double book please, let pt know not to fill antibioitc as does niot seem to be the problem/pls do nmot send the keflex

## 2014-06-26 ENCOUNTER — Other Ambulatory Visit: Payer: Self-pay | Admitting: Family Medicine

## 2014-06-26 ENCOUNTER — Ambulatory Visit (HOSPITAL_COMMUNITY)
Admission: RE | Admit: 2014-06-26 | Discharge: 2014-06-26 | Disposition: A | Discharge status: Home / Self Care | Payer: Medicare Other | Source: Ambulatory Visit | Attending: Family Medicine | Admitting: Family Medicine

## 2014-06-26 ENCOUNTER — Ambulatory Visit (INDEPENDENT_AMBULATORY_CARE_PROVIDER_SITE_OTHER): Payer: Medicare Other | Admitting: Family Medicine

## 2014-06-26 ENCOUNTER — Encounter: Payer: Self-pay | Admitting: Family Medicine

## 2014-06-26 ENCOUNTER — Encounter (INDEPENDENT_AMBULATORY_CARE_PROVIDER_SITE_OTHER): Payer: Self-pay

## 2014-06-26 VITALS — BP 144/74 | HR 91 | Resp 16 | Ht 61.5 in | Wt 110.8 lb

## 2014-06-26 DIAGNOSIS — R229 Localized swelling, mass and lump, unspecified: Principal | ICD-10-CM

## 2014-06-26 DIAGNOSIS — R1909 Other intra-abdominal and pelvic swelling, mass and lump: Secondary | ICD-10-CM

## 2014-06-26 DIAGNOSIS — IMO0002 Reserved for concepts with insufficient information to code with codable children: Secondary | ICD-10-CM

## 2014-06-26 DIAGNOSIS — E1165 Type 2 diabetes mellitus with hyperglycemia: Secondary | ICD-10-CM

## 2014-06-26 DIAGNOSIS — IMO0001 Reserved for inherently not codable concepts without codable children: Secondary | ICD-10-CM

## 2014-06-26 DIAGNOSIS — E1065 Type 1 diabetes mellitus with hyperglycemia: Secondary | ICD-10-CM

## 2014-06-26 DIAGNOSIS — I10 Essential (primary) hypertension: Secondary | ICD-10-CM | POA: Diagnosis not present

## 2014-06-26 DIAGNOSIS — R112 Nausea with vomiting, unspecified: Secondary | ICD-10-CM | POA: Diagnosis not present

## 2014-06-26 DIAGNOSIS — Z794 Long term (current) use of insulin: Secondary | ICD-10-CM

## 2014-06-26 MED ORDER — TRAMADOL HCL 50 MG PO TABS: ORAL_TABLET | ORAL | Status: DC

## 2014-06-26 NOTE — Progress Notes (Signed)
   Subjective:    Patient ID: Emily Jennings, female    DOB: 09-02-1934, 80 y.o.   MRN: [id number]  HPI 2 day h/o tender lump in left groin, denies vomit, no fever or chills, states she had loose stools which have cleared, no drainage from area, no trauma to area, no previous swelling    Review of Systems See HPI Denies recent fever or chills. Denies sinus pressure, nasal congestion, ear pain or sore throat. Denies chest congestion, productive cough or wheezing. Denies chest pains, palpitations and leg swelling . Denies depression, anxiety or insomnia. Denies skin break down or rash.        Objective:   Physical Exam BP 144/74  Pulse 91  Resp 16  Ht 5' 1.5" (1.562 m)  Wt 110 lb 12.8 oz (50.259 kg)  BMI 20.60 kg/m2  SpO2 97% Patient alert and oriented and in no cardiopulmonary distress.pt anxious  HEENT: No facial asymmetry, EOMI,   oropharynx pink and moist.  Neck supple no JVD, no mass.  Chest: Clear to auscultation bilaterally.  CVS: S1, S2 no murmurs, no S3.Regular rate.  ABD: Soft non tender. No organomegaly or mass, bowel sounds normal. Swelling in left groin, tender to deep palpation, no erythema , warmth or drainaage, not a hernia, not an abcess  Ext: No edema  MS: Adequate ROM spine, shoulders, hips and knees.  Skin: Intact, no ulcerations or rash noted.  Psych: Good eye contact, normal affect. Memory intact not anxious or depressed appearing.  CNS: CN 2-12 intact, power,  normal throughout.no focal deficits noted.        Assessment & Plan:  Lump in the groin Approx 4 cm left groin swelling, not infected, tender to deep palpation, US reveals complex structure , uncertain etiology, surgery to evaluate and manage asap. Tramadol prescribed for pain  Essential hypertension Controlled, no change in medication   Diabetes mellitus, insulin dependent (IDDM), uncontrolled Uncontrolled Patient advised to reduce carb and sweets, commit to regular physical  activity, take meds as prescribed, test blood as directed,   to improve blood sugar control.

## 2014-06-26 NOTE — Patient Instructions (Signed)
F/u as before  You are referred for an ultrasound of the swelling in your left groin.  You are referred to see Dr Arnoldo Morale about this  Swelling  Medication is prescribed for pain, it may make you sleepy, so be careful not to take it if you are driving or moving around outside of your home

## 2014-06-28 NOTE — Assessment & Plan Note (Signed)
Controlled, no change in medication  

## 2014-06-28 NOTE — Assessment & Plan Note (Signed)
Approx 4 cm left groin swelling, not infected, tender to deep palpation, US reveals complex structure , uncertain etiology, surgery to evaluate and manage asap. Tramadol prescribed for pain

## 2014-06-28 NOTE — Assessment & Plan Note (Signed)
Uncontrolled Patient advised to reduce carb and sweets, commit to regular physical activity, take meds as prescribed, test blood as directed,   to improve blood sugar control.

## 2014-07-03 ENCOUNTER — Telehealth: Payer: Self-pay

## 2014-07-03 NOTE — Telephone Encounter (Signed)
States her sugars have been better this week with just one high reading.  180 265 192 152 144 has noticed that her sugars are better when she drinks a lot of water so she has been doing so.

## 2014-07-03 NOTE — Telephone Encounter (Signed)
If current lantus dose is 12 units , (as  on file) I recommend increasing by 3 units to 15 units daily, and change dose on med list If she is on a different dose, please increase by 3 units and enter new dose also  If you have questions pls ask

## 2014-07-06 ENCOUNTER — Other Ambulatory Visit: Payer: Self-pay

## 2014-07-06 DIAGNOSIS — Z794 Long term (current) use of insulin: Principal | ICD-10-CM

## 2014-07-06 DIAGNOSIS — IMO0001 Reserved for inherently not codable concepts without codable children: Secondary | ICD-10-CM

## 2014-07-06 DIAGNOSIS — E1165 Type 2 diabetes mellitus with hyperglycemia: Principal | ICD-10-CM

## 2014-07-06 MED ORDER — INSULIN GLARGINE 100 UNIT/ML SOLOSTAR PEN
21.0000 [IU] | PEN_INJECTOR | Freq: Every day | SUBCUTANEOUS | Status: DC
Start: 2014-07-06 — End: 2014-10-13

## 2014-07-06 NOTE — Telephone Encounter (Signed)
Patient aware and was taking 18 units (not 12) advised to increase 3 units to 21 and I would call her back to check on her sugars mid next week

## 2014-07-07 ENCOUNTER — Telehealth: Payer: Self-pay | Admitting: Family Medicine

## 2014-07-07 DIAGNOSIS — R1904 Left lower quadrant abdominal swelling, mass and lump: Secondary | ICD-10-CM | POA: Diagnosis not present

## 2014-07-07 NOTE — Telephone Encounter (Signed)
Received a letter stating to come [place] and she just can not sit there that long with her kidneys and will need water to  Keep her blood sugars down would like to have a note to get out please call back

## 2014-07-07 NOTE — Telephone Encounter (Signed)
pls type a note of excuse on medical grounds , I will sign

## 2014-07-09 NOTE — Telephone Encounter (Signed)
Letter composed.

## 2014-07-15 DIAGNOSIS — E109 Type 1 diabetes mellitus without complications: Secondary | ICD-10-CM | POA: Diagnosis not present

## 2014-07-15 LAB — COMPLETE METABOLIC PANEL WITH GFR
ALBUMIN: 4.4 g/dL (ref 3.5–5.2)
ALT: 16 U/L (ref 0–35)
AST: 18 U/L (ref 0–37)
Alkaline Phosphatase: 97 U/L (ref 39–117)
BUN: 23 mg/dL (ref 6–23)
CALCIUM: 10.3 mg/dL (ref 8.4–10.5)
CHLORIDE: 100 meq/L (ref 96–112)
CO2: 32 mEq/L (ref 19–32)
Creat: 0.98 mg/dL (ref 0.50–1.10)
GFR, EST NON AFRICAN AMERICAN: 55 mL/min — AB
GFR, Est African American: 64 mL/min
GLUCOSE: 255 mg/dL — AB (ref 70–99)
POTASSIUM: 5.1 meq/L (ref 3.5–5.3)
Sodium: 139 mEq/L (ref 135–145)
Total Bilirubin: 0.6 mg/dL (ref 0.2–1.2)
Total Protein: 6.9 g/dL (ref 6.0–8.3)

## 2014-07-15 LAB — HEMOGLOBIN A1C
Hgb A1c MFr Bld: 10.9 % — ABNORMAL HIGH (ref <5.7)
Mean Plasma Glucose: 266 mg/dL — ABNORMAL HIGH (ref <117)

## 2014-07-16 IMAGING — CR DG WRIST COMPLETE 3+V*R*
2 series · 2 of 2 positions shown · non-contrast
Comparison: None.

CLINICAL DATA: Status post fall; right hand and wrist pain.

EXAM:
RIGHT WRIST - COMPLETE 3+ VIEW

[view not recorded (1 of 2)]
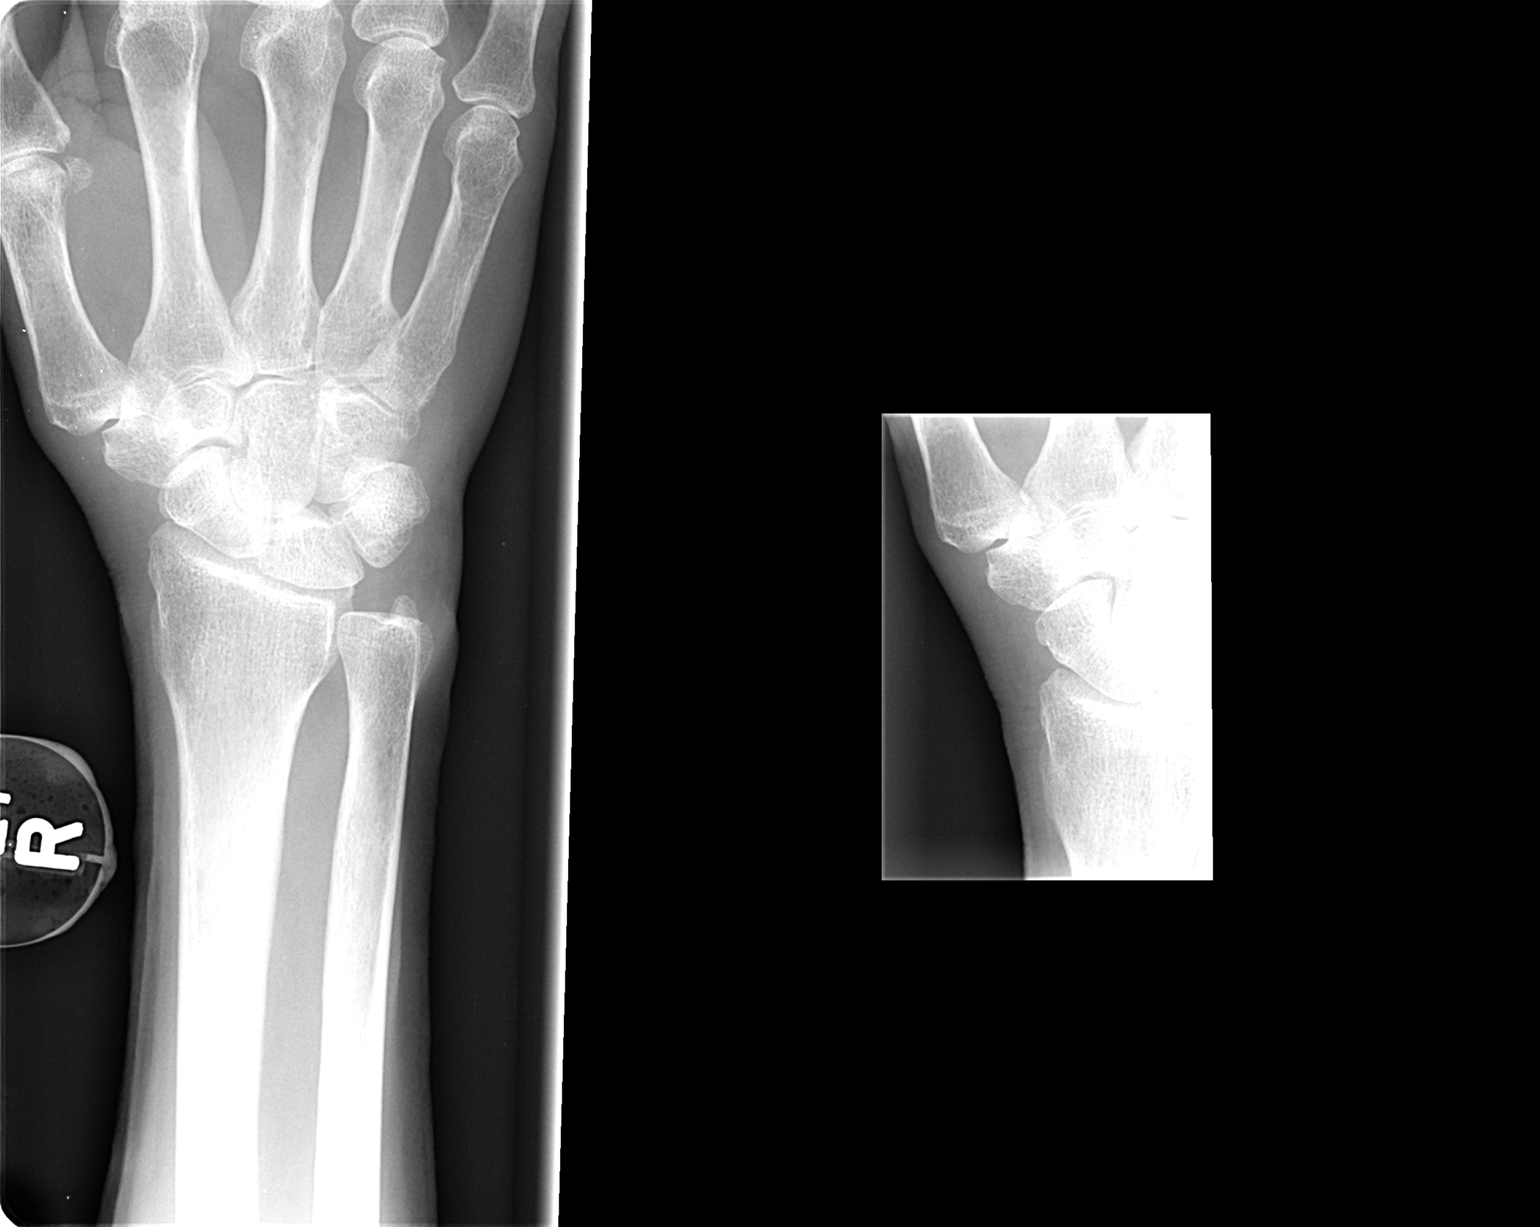

[view not recorded (2 of 2)]
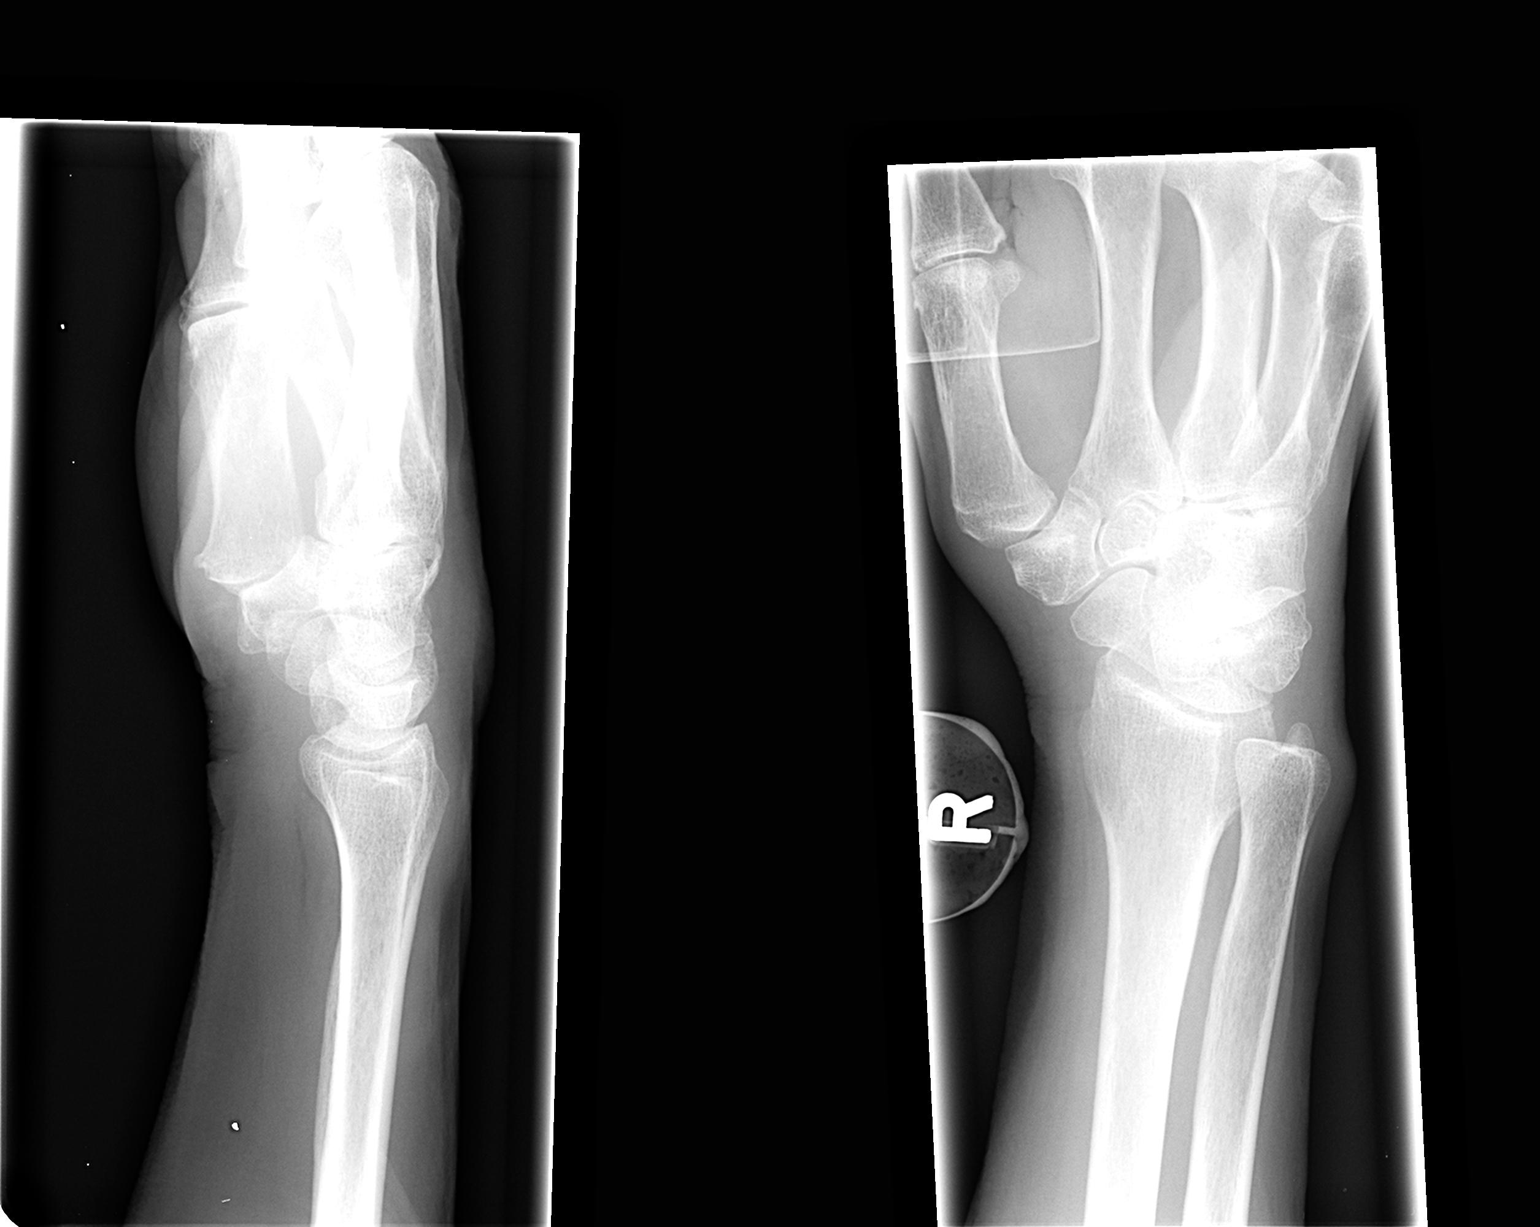

[2 of 2 positions shown; findings below may reference images not displayed]

FINDINGS: There is no evidence of fracture or dislocation. The carpal rows are
intact, and demonstrate normal alignment. The joint spaces are
preserved. Mild negative ulnar variance is noted.

No significant soft tissue abnormalities are seen.
IMPRESSION: No evidence of fracture or dislocation.

## 2014-07-17 ENCOUNTER — Encounter (INDEPENDENT_AMBULATORY_CARE_PROVIDER_SITE_OTHER): Payer: Self-pay

## 2014-07-17 ENCOUNTER — Encounter: Payer: Self-pay | Admitting: Family Medicine

## 2014-07-17 ENCOUNTER — Ambulatory Visit (INDEPENDENT_AMBULATORY_CARE_PROVIDER_SITE_OTHER): Payer: Medicare Other | Admitting: Family Medicine

## 2014-07-17 VITALS — BP 138/72 | HR 65 | Resp 16 | Ht 62.0 in | Wt 116.0 lb

## 2014-07-17 DIAGNOSIS — E1065 Type 1 diabetes mellitus with hyperglycemia: Secondary | ICD-10-CM

## 2014-07-17 DIAGNOSIS — E1165 Type 2 diabetes mellitus with hyperglycemia: Secondary | ICD-10-CM

## 2014-07-17 DIAGNOSIS — Z794 Long term (current) use of insulin: Secondary | ICD-10-CM

## 2014-07-17 DIAGNOSIS — Z0000 Encounter for general adult medical examination without abnormal findings: Secondary | ICD-10-CM

## 2014-07-17 DIAGNOSIS — R1909 Other intra-abdominal and pelvic swelling, mass and lump: Secondary | ICD-10-CM | POA: Diagnosis not present

## 2014-07-17 DIAGNOSIS — D0592 Unspecified type of carcinoma in situ of left breast: Secondary | ICD-10-CM

## 2014-07-17 DIAGNOSIS — D259 Leiomyoma of uterus, unspecified: Secondary | ICD-10-CM

## 2014-07-17 DIAGNOSIS — IMO0001 Reserved for inherently not codable concepts without codable children: Secondary | ICD-10-CM

## 2014-07-17 DIAGNOSIS — N83209 Unspecified ovarian cyst, unspecified side: Secondary | ICD-10-CM

## 2014-07-17 DIAGNOSIS — Z13820 Encounter for screening for osteoporosis: Secondary | ICD-10-CM

## 2014-07-17 LAB — GLUCOSE, POCT (MANUAL RESULT ENTRY): POC Glucose: 212 mg/dl — AB (ref 70–99)

## 2014-07-17 NOTE — Progress Notes (Signed)
Subjective:    Patient ID: Emily Jennings, female    DOB: December 31, 1933, 80 y.o.   MRN: [id number]  HPI Preventive Screening-Counseling & Management   Patient present here today for a Medicare annual wellness visit.   Current Problems (verified)   Medications Prior to Visit Allergies (verified)   PAST HISTORY  Family History (verified)   Social History Married for 17 years, 2 children, homemaker    Risk Factors  Current exercise habits:  Exercises at home with small weights daily , needs to start aerobic aactivity  Dietary issues discussed: Heart healthy diet, limits sugars and carbs and eats a lot of fruits and vegetables   Cardiac risk factors: brothers dx with heart disease (one in his 68's and one at age 50), pt is diabetic  Depression Screen  (Note: if answer to either of the following is "Yes", a more complete depression screening is indicated)   Over the past two weeks, have you felt down, depressed or hopeless? No  Over the past two weeks, have you felt little interest or pleasure in doing things? No  Have you lost interest or pleasure in daily life? No  Do you often feel hopeless? No  Do you cry easily over simple problems? No   Activities of Daily Living  In your present state of health, do you have any difficulty performing the following activities?  Driving?: No Managing money?: No Feeding yourself?:No Getting from bed to chair?:No Climbing a flight of stairs?:No Preparing food and eating?:No Bathing or showering?:No Getting dressed?:No Getting to the toilet?:No Using the toilet?:No Moving around from place to place?: No  Fall Risk Assessment In the past year have you fallen or had a near fall?: yes, tripped over her husbands feet  Are you currently taking any medications that make you dizzy?: no    Hearing Difficulties: No Do you often ask people to speak up or repeat themselves?: sometimes ,but not  Do you experience ringing or noises in your  ears?:No Do you have difficulty understanding soft or whispered voices?:No  Cognitive Testing  Alert? Yes Normal Appearance?Yes  Oriented to person? Yes Place? Yes  Time? Yes  Displays appropriate judgment?Yes  Can read the correct time from a watch face? yes Are you having problems remembering things?No  Advanced Directives have been discussed with the patient?Yes, will complete one before the year is done , full code   List the Names of Other Physician/Practitioners you currently use:  Dr Aline Brochure (ortho)  Dr Arnoldo Morale (surgeon)  Indicate any recent Medical Services you may have received from other than [facility] providers in the past year (date may be approximate).   Assessment:    Annual Wellness Exam   Plan:      Medicare Attestation  I have personally reviewed:  The patient's medical and social history  Their use of alcohol, tobacco or illicit drugs  Their current medications and supplements  The patient's functional ability including ADLs,fall risks, home safety risks, cognitive, and hearing and visual impairment  Diet and physical activities  Evidence for depression or mood disorders  The patient's weight, height, BMI, and visual acuity have been recorded in the chart. I have made referrals, counseling, and provided education to the patient based on review of the above and I have provided the patient with a written personalized care plan for preventive services.      Review of Systems     Objective:   Physical Exam  BP 138/72  Pulse 65  Resp 16  Ht 5\' 2"  (1.575 m)  Wt 116 lb (52.617 kg)  BMI 21.21 kg/m2  SpO2 98%       Assessment & Plan:

## 2014-07-17 NOTE — Patient Instructions (Signed)
F/u in 3.5 month, call if you need me before  Be careful about falls and no scatter rugs and de clutter your home  You are referred for bone density test  Please call bt next week Wednesday tio let us know if you want to be referred to [facility] for surgery on groin mass  You are referred to Dr Dorris Fetch since blood sugar is remaining too high  Please call number provided for help with getting your living will in writing since you have thought about this and you have strong feelings about certain things   Will send for last gyne visit, I believe that you need a return scheduled

## 2014-07-18 ENCOUNTER — Encounter: Payer: Self-pay | Admitting: Family Medicine

## 2014-07-18 DIAGNOSIS — D259 Leiomyoma of uterus, unspecified: Secondary | ICD-10-CM | POA: Insufficient documentation

## 2014-07-18 NOTE — Assessment & Plan Note (Signed)
uncontrolled for entire year. Wads referred to endo in July , has not been I have reached out trying to do weekly/ biweekly calls and support from the office in the past 3 months, unfortunately , most recent HBA1C is worse than before, she NEEDS endo care , will enlist [facility] help to have her follow through

## 2014-07-18 NOTE — Assessment & Plan Note (Signed)
Annual exam as documented. Counseling done  re healthy lifestyle involving commitment to 150 minutes exercise per week, heart healthy diet, and attaining healthy weight.The importance of adequate sleep also discussed. Regular seat belt use and home safety, is also discussed. Changes in health habits are decided on by the patient with goals and time frames  set for achieving them. Immunization and cancer screening needs are specifically addressed at this visit. Will refer pt for pelvic US has past h/o ovarian cyst, past h/o breast cancer and tamoxifen use  , also has h/o fibroids. She also needs a bone density test

## 2014-07-18 NOTE — Assessment & Plan Note (Addendum)
Discussed the need to have the lesion removed. Initially stated did not want it done locally, but per nurse , at end of  Visit , she decided to stay in [place], will enlist the help of her very able / interested [place] with care co ordination, pt anxious and overwhelmed, understandably so

## 2014-07-20 ENCOUNTER — Other Ambulatory Visit: Payer: Self-pay | Admitting: Family Medicine

## 2014-07-20 DIAGNOSIS — R1909 Other intra-abdominal and pelvic swelling, mass and lump: Secondary | ICD-10-CM

## 2014-07-20 DIAGNOSIS — D0592 Unspecified type of carcinoma in situ of left breast: Secondary | ICD-10-CM

## 2014-07-20 DIAGNOSIS — D259 Leiomyoma of uterus, unspecified: Secondary | ICD-10-CM

## 2014-07-20 DIAGNOSIS — N83209 Unspecified ovarian cyst, unspecified side: Secondary | ICD-10-CM

## 2014-07-22 ENCOUNTER — Ambulatory Visit (HOSPITAL_COMMUNITY)
Admission: RE | Admit: 2014-07-22 | Discharge: 2014-07-22 | Disposition: A | Discharge status: Home / Self Care | Payer: Medicare Other | Source: Ambulatory Visit | Attending: Family Medicine | Admitting: Family Medicine

## 2014-07-22 ENCOUNTER — Ambulatory Visit (HOSPITAL_COMMUNITY): Payer: Medicare Other

## 2014-07-22 DIAGNOSIS — N83209 Unspecified ovarian cyst, unspecified side: Secondary | ICD-10-CM

## 2014-07-22 DIAGNOSIS — D259 Leiomyoma of uterus, unspecified: Secondary | ICD-10-CM | POA: Diagnosis not present

## 2014-07-22 DIAGNOSIS — R1909 Other intra-abdominal and pelvic swelling, mass and lump: Secondary | ICD-10-CM

## 2014-07-22 DIAGNOSIS — D0592 Unspecified type of carcinoma in situ of left breast: Secondary | ICD-10-CM

## 2014-07-23 ENCOUNTER — Ambulatory Visit (HOSPITAL_COMMUNITY)
Admission: RE | Admit: 2014-07-23 | Discharge: 2014-07-23 | Disposition: A | Discharge status: Home / Self Care | Payer: Medicare Other | Source: Ambulatory Visit | Attending: Family Medicine | Admitting: Family Medicine

## 2014-07-23 DIAGNOSIS — E119 Type 2 diabetes mellitus without complications: Secondary | ICD-10-CM | POA: Diagnosis not present

## 2014-07-23 DIAGNOSIS — Z13820 Encounter for screening for osteoporosis: Secondary | ICD-10-CM

## 2014-07-23 DIAGNOSIS — Z780 Asymptomatic menopausal state: Secondary | ICD-10-CM | POA: Diagnosis not present

## 2014-07-23 DIAGNOSIS — M810 Age-related osteoporosis without current pathological fracture: Secondary | ICD-10-CM | POA: Insufficient documentation

## 2014-07-23 DIAGNOSIS — Z794 Long term (current) use of insulin: Secondary | ICD-10-CM | POA: Insufficient documentation

## 2014-07-28 ENCOUNTER — Encounter: Payer: Self-pay | Admitting: Family Medicine

## 2014-07-28 DIAGNOSIS — M810 Age-related osteoporosis without current pathological fracture: Secondary | ICD-10-CM | POA: Insufficient documentation

## 2014-07-28 DIAGNOSIS — R1904 Left lower quadrant abdominal swelling, mass and lump: Secondary | ICD-10-CM | POA: Diagnosis not present

## 2014-07-29 NOTE — H&P (Signed)
  NTS SOAP Note  Vital Signs:  Vitals as of: [id number]: Systolic 0000000: Diastolic 74: Heart Rate 66: Temp 97.64F: Height 20ft 0in: Weight 114Lbs 0 Ounces: BMI 22.26  BMI : 22.26 kg/m2  Subjective: This 80 year old female presents for of a lump in the left groin.  Patient states it suddenly came up.  U/S shows complex lesion,  but does not appear to be a lymph node.  No fevers.  No recent change noted.  Review of Symptoms:  Constitutional:unremarkable   dizzy Eyes:unremarkable   Nose/Mouth/Throat:unremarkable Cardiovascular:  unremarkable Respiratory:unremarkable Gastrointestinal:  unremarkable   Genitourinary:urinary hesitancy joint,  back,  and neck pain Skin:unremarkable Hematolgic/Lymphatic:unremarkable   Allergic/Immunologic:unremarkable   Past Medical History:  Reviewed  Past Medical History  Surgical History: ? Medical Problems: DCIS breast,  HTN,  osteoporosis,  CVD,  GERD,  IDDM Allergies: benadryl,  citalopram,  metformin,  tylenol  Medications: proventil,  amlodipine,  neurontin,  glucatrol,  lantus,  cozaar,  lopressor,  prilosec,  aldactone,  kdur,  ultram   Social History:Reviewed  Social History  Preferred Language: English Race:  Black or African American Ethnicity: Not Hispanic / Latino Age: 63 year Marital Status:  M   Smoking Status: Never smoker reviewed on 07/07/2014 Functional Status reviewed on 07/07/2014 ------------------------------------------------ Bathing: Normal Cooking: Normal Dressing: Normal Driving: Normal Eating: Normal Managing Meds: Normal Oral Care: Normal Shopping: Normal Toileting: Normal Transferring: Normal Walking: Normal Cognitive Status reviewed on 07/07/2014 ------------------------------------------------ Attention: Normal Decision Making: Normal Language: Normal Memory: Normal Motor: Normal Perception: Normal Problem Solving: Normal Visual and Spatial: Normal   Family  History:Reviewed  Family Health History Family History is Unknown    Objective Information: General:Well appearing, well nourished in no distress. Neck:Supple without lymphadenopathy.  Heart:RRR, no murmur or gallop.  Normal S1, S2.  No S3, S4.  Lungs:  CTA bilaterally, no wheezes, rhonchi, rales.  Breathing unlabored. No adenopathy in axillas. Abdomen:Soft, NT/ND, no HSM, no masses.  3cm oval,  mobile mass in the left groin,  below inguinal ridge.  No lymphadenopathy noted.  Assessment:Left groin mass,  ?occult femoral hernia containing adipose tissue,  old hematoma  Diagnoses: 789.30  R19.04 Groin mass (Left lower quadrant abdominal swelling, mass and lump)  Procedures: [id number] - OFFICE OUTPATIENT NEW 30 MINUTES    Plan:I explained to patient that I would like to explore and remove the mass.  The risks and benefits of the procedure were fully explained to the patient, who gave informed consent.

## 2014-08-05 ENCOUNTER — Ambulatory Visit (INDEPENDENT_AMBULATORY_CARE_PROVIDER_SITE_OTHER): Payer: Medicare Other | Admitting: Obstetrics and Gynecology

## 2014-08-05 VITALS — Ht 61.0 in

## 2014-08-05 DIAGNOSIS — R1909 Other intra-abdominal and pelvic swelling, mass and lump: Secondary | ICD-10-CM | POA: Diagnosis not present

## 2014-08-05 NOTE — Progress Notes (Signed)
[facility] Visit  Patient name: Emily Jennings MRN [id number]  Date of birth: 12-13-1933  CC & HPI:  Emily Jennings is a 80 y.o. female presenting today for mass that is above her pubic bone. Pt states that she has had this area since the first of October and that she has had an [place] for this area and her PCP wants her to have it removed. Pt does not want to be cut on if she can help it. Pt states that the area only causes her pain when the area is pressed on and then it only hurts the day after. She reports that she has an appointment for it to be removed on December 4th. She denies any other symptoms. Pt last pap was in 2013 and was normal.    ROS:  +Mass in the Left inguinal area No other complaints.   Pertinent History Reviewed:   Reviewed: Significant for  Medical         Past Medical History  Diagnosis Date  . Anxiety   . Hypertension   . Allergy   . Hyperglycemia   . Low blood potassium   . Diabetes mellitus   . Osteoporosis   . Non-insulin dependent type 2 diabetes mellitus   . Coronary artery disease     cardiac catheterization on 03/20/2006  LAD mid 40% stenosis, left circumflex mild 40% stenosis, RCA mid-vessel 40% to 50% lesion   EF 60%  . Carotid stenosis     11/16/2005  mild plaque formation and stenosis proximal right ECA  . Ventricular tachycardia, non-sustained     developed during stress test 02/08/2006, spontaneously aborted, mild reversible apical defect  . Shortness of breath     2D Echocardiogram 01/26/2009   EF of greater than 55%, mild MR, mild TR, normal ventricular function  . Arthritis   . Cataract   . GERD (gastroesophageal reflux disease)   . Cancer 2009    breast, carcinoma in situ left                              Surgical Hx:    Past Surgical History  Procedure Laterality Date  . Cyst removed from left foot    . Breast lumpectomy      Left breast 2009   Medications: Reviewed & Updated - see associated section  Current outpatient prescriptions: albuterol (PROVENTIL HFA;VENTOLIN HFA) 108 (90 BASE) MCG/ACT inhaler, Inhale 1-2 puffs into the lungs every 6 (six) hours as needed for wheezing or shortness of breath., Disp: 18 g, Rfl: 0;  amLODipine (NORVASC) 10 MG tablet, TAKE 1 TABLET (10 MG TOTAL) BY MOUTH DAILY., Disp: 90 tablet, Rfl: 1;  B-D ULTRA-FINE 33 LANCETS MISC, USE TO TEST BLOOD SUGAR TWICE DAILY dx 250.03, Disp: 100 each, Rfl: 5 BAYER CONTOUR NEXT TEST test strip, USE TEST STRIPS TO CHECK BLOOD SUGAR THREE TIMES DAILY, Disp: 100 each, Rfl: 3;  calcium-vitamin D (OSCAL WITH D) 500-200 MG-UNIT per tablet, Take 2 tablets by mouth daily with breakfast., Disp: , Rfl: ;  fluticasone (FLONASE) 50 MCG/ACT nasal spray, Place 2 sprays into both nostrils daily., Disp: 48 g, Rfl: 1;  gabapentin (NEURONTIN) 100 MG capsule, Two tabs by mouth at bedtime, Disp: 60 capsule, Rfl: 2 Garlic [id number] MG CAPS, Take 2 capsules by mouth daily., Disp: , Rfl: ;  glipiZIDE (GLUCOTROL XL) 10 MG 24 hr tablet, Take 1 tablet (10 mg total) by  mouth daily with breakfast., Disp: 30 tablet, Rfl: 3;  Insulin Glargine (LANTUS) 100 UNIT/ML Solostar Pen, Inject 21 Units into the skin daily., Disp: 15 mL, Rfl: 11;  Insulin Pen Needle 31G X 8 MM MISC, Use to inject insulin, Disp: 50 each, Rfl: 11 losartan (COZAAR) 100 MG tablet, TAKE ONE TABLET BY MOUTH EVERY DAY, Disp: 90 tablet, Rfl: 1;  metoprolol (LOPRESSOR) 50 MG tablet, TAKE 1 TABLET BY MOUTH TWICE A DAY, Disp: 180 tablet, Rfl: 1;  Multiple Vitamins-Minerals (MULTI COMPLETE PO), Take 1 tablet by mouth daily.  , Disp: , Rfl: ;  omeprazole (PRILOSEC) 20 MG capsule, TAKE ONE CAPSULE BY MOUTH ONCE DAILY FOR ACID, Disp: 60 capsule, Rfl: 5 potassium chloride SA (K-DUR,KLOR-CON) 20 MEQ tablet, TAKE ONE TABLET BY MOUTH TWICE DAILY, Disp: 180 tablet, Rfl: 1;  spironolactone (ALDACTONE) 25 MG tablet, TAKE ONE TABLET BY MOUTH TWICE DAILY, Disp: 60 tablet, Rfl: 3;  traMADol (ULTRAM) 50 MG tablet, One tablet at  bedtime, as needed, for pain, Disp: 10 tablet, Rfl: 0   Social History: Reviewed -  reports that she has never smoked. She has never used smokeless tobacco.  Objective Findings:  Vitals: Height 5\' 1"  (1.549 m).  Physical Examination:  Lymph nodes/groin Left:2 cm Mobile rubbery nodule in the Left groin area with slight indentable texture. Pelvic - normal external genitalia, vulva, vagina, cervix, uterus and adnexa,  VULVA: normal appearing vulva with no masses, tenderness or lesions,  VAGINA: normal appearing vagina with normal color and discharge, no lesions,  CERVIX: normal appearing cervix without discharge or lesions, Nabothian cyst at 10 and 2 o'clock,  UTERUS: uterus is normal size, shape, consistency and nontender,  ADNEXA: normal adnexa in size, nontender and no masses Extremity: no masses , injuries or sites of infection.   Assessment & Plan:   A:  1. Mass in the left inguinal area ?Lymphnode 2. Normal pelvic exam in office  P:  1. Recommend for pt to continue with the surgery that is scheduled for 08/21/14 2.   This chart was scribed for Jonnie Kind, MD by Steva Colder, ED Scribe. The patient was seen in room 2 at 11:40 AM.

## 2014-08-05 NOTE — Progress Notes (Signed)
Patient ID: Emily Jennings, female   DOB: 1933-12-05, 80 y.o.   MRN: [id number] Pt here today for area above her pubic bone. Pt states that she has had this area since the first part of October and that she has had an [place] for this area and her PCP wants her to have it removed. Pt does not want to be cut on if she can help it. Pt states that the area only causes her pain when the area is pressed on and then it only hurts the day after.

## 2014-08-06 ENCOUNTER — Telehealth: Payer: Self-pay

## 2014-08-06 MED ORDER — ALENDRONATE SODIUM 70 MG PO TABS: 70.0000 mg | ORAL_TABLET | ORAL | Status: DC

## 2014-08-06 NOTE — Telephone Encounter (Signed)
-----   Message from [name], MD sent at 07/28/2014 11:19 PM EST ----- Please let patient know that she has osteoporosis, which means that her bones have thinned a lot over the years, are not as strong as before, and she is therefore at an increased risk of a fracture, with or without falling. Apart from calcium and vit D supplements daily and   regular weight bearing  physical activity , it is recommended that she starts once weekly medication to strengthen her bones. Please send in whichever drug is covered, either actonel 35 mg one tablet once weekly #4 refill 11 , or fosamax 70 mg once weekly #4  refill  11 and let her know, once she agrees to start the medication. If already on bone builder needs to continue this, and she will have repeat bone density test in 2 to 3 years.

## 2014-08-06 NOTE — Telephone Encounter (Signed)
Med sent and patient aware to check with pharmacy

## 2014-08-17 ENCOUNTER — Inpatient Hospital Stay (HOSPITAL_COMMUNITY): Admission: RE | Admit: 2014-08-17 | Payer: Medicare Other | Source: Ambulatory Visit

## 2014-08-21 ENCOUNTER — Ambulatory Visit (HOSPITAL_COMMUNITY): Admission: RE | Admit: 2014-08-21 | Payer: Medicare Other | Source: Ambulatory Visit | Admitting: General Surgery

## 2014-08-21 ENCOUNTER — Encounter (HOSPITAL_COMMUNITY): Admission: RE | Payer: Self-pay | Source: Ambulatory Visit

## 2014-08-21 SURGERY — EXCISION MASS
Anesthesia: General

## 2014-08-31 DIAGNOSIS — H2513 Age-related nuclear cataract, bilateral: Secondary | ICD-10-CM | POA: Diagnosis not present

## 2014-08-31 DIAGNOSIS — H268 Other specified cataract: Secondary | ICD-10-CM | POA: Diagnosis not present

## 2014-08-31 DIAGNOSIS — H35363 Drusen (degenerative) of macula, bilateral: Secondary | ICD-10-CM | POA: Diagnosis not present

## 2014-08-31 DIAGNOSIS — H2589 Other age-related cataract: Secondary | ICD-10-CM | POA: Diagnosis not present

## 2014-09-02 ENCOUNTER — Other Ambulatory Visit: Payer: Self-pay | Admitting: Family Medicine

## 2014-09-15 ENCOUNTER — Other Ambulatory Visit: Payer: Self-pay | Admitting: *Deleted

## 2014-09-15 MED ORDER — GABAPENTIN 100 MG PO CAPS: ORAL_CAPSULE | ORAL | Status: DC

## 2014-09-16 ENCOUNTER — Other Ambulatory Visit: Payer: Self-pay | Admitting: Family Medicine

## 2014-09-16 ENCOUNTER — Other Ambulatory Visit: Payer: Self-pay

## 2014-09-16 DIAGNOSIS — C50912 Malignant neoplasm of unspecified site of left female breast: Secondary | ICD-10-CM

## 2014-09-16 MED ORDER — GLIPIZIDE ER 10 MG PO TB24: ORAL_TABLET | ORAL | Status: DC

## 2014-09-16 MED ORDER — ALENDRONATE SODIUM 70 MG PO TABS: 70.0000 mg | ORAL_TABLET | ORAL | Status: DC

## 2014-09-16 MED ORDER — SPIRONOLACTONE 25 MG PO TABS: ORAL_TABLET | ORAL | Status: DC

## 2014-09-29 ENCOUNTER — Ambulatory Visit (HOSPITAL_COMMUNITY)
Admission: RE | Admit: 2014-09-29 | Discharge: 2014-09-29 | Disposition: A | Discharge status: Home / Self Care | Payer: Medicare Other | Source: Ambulatory Visit | Attending: Family Medicine | Admitting: Family Medicine

## 2014-09-29 DIAGNOSIS — R922 Inconclusive mammogram: Secondary | ICD-10-CM | POA: Diagnosis not present

## 2014-09-29 DIAGNOSIS — C50912 Malignant neoplasm of unspecified site of left female breast: Secondary | ICD-10-CM

## 2014-09-29 DIAGNOSIS — Z853 Personal history of malignant neoplasm of breast: Secondary | ICD-10-CM | POA: Insufficient documentation

## 2014-10-13 ENCOUNTER — Other Ambulatory Visit: Payer: Self-pay | Admitting: Family Medicine

## 2014-10-14 ENCOUNTER — Other Ambulatory Visit: Payer: Self-pay | Admitting: Family Medicine

## 2014-10-15 ENCOUNTER — Other Ambulatory Visit: Payer: Self-pay | Admitting: Family Medicine

## 2014-10-16 ENCOUNTER — Other Ambulatory Visit: Payer: Self-pay | Admitting: Family Medicine

## 2014-10-19 ENCOUNTER — Other Ambulatory Visit: Payer: Self-pay | Admitting: Family Medicine

## 2014-10-21 DIAGNOSIS — H2513 Age-related nuclear cataract, bilateral: Secondary | ICD-10-CM | POA: Diagnosis not present

## 2014-10-23 ENCOUNTER — Other Ambulatory Visit: Payer: Self-pay | Admitting: Ophthalmology

## 2014-10-23 MED ORDER — TETRACAINE HCL 0.5 % OP SOLN: 1.0000 [drp] | OPHTHALMIC | Status: AC

## 2014-10-23 NOTE — H&P (Signed)
History & Physical:   DATE:   10-21-14  NAME:  Emily Jennings, Emily Jennings      [name]       HISTORY OF PRESENT ILLNESS: Chief Eye Complaints blurry vision   blood sugar 95mg % this am. Va has decrease in OS, unable to read.   HPI: EYES: Reports symptoms of     LOCATION:   LEFT EYE        QUALITY/COURSE:   Reports condition is worsening.        INTENSITY/SEVERITY:    Reports measurement ( or degree) as moderate.      DURATION:   Reports the general length of symptoms to be months.     ACTIVE PROBLEMS: Pseudoexfoliation, lens capsule   ICD10:   ICD9: 366.11  Onset: 10/21/2014 11:57  Initial Date:    Drusen (degenerative) of macula, bilateral   ICD10: H35.363  ICD9:   Onset: 08/31/2014 12:04  Initial Date:    Age-related nuclear cataract, bilateral   ICD10: H25.13  ICD9:   Onset: 08/31/2014 10:24  Initial Date:   worse OS causing progressive angle narrowing w EIOP OS  Dry eye syndrome   ICD10: H04.129  ICD9: 375.15  Onset: 08/31/2014 10:24  Initial Date:  SURGERIES: Lumpectomy 2009 (left) Pick List - Surgeries  MEDICATIONS: Ilevro: 0.3% suspension SIG-  1 drop in OS QD   Ciloxan (Ciprofloxacin) Solution: 0.3% solution SIG-  1 drop in OS BID  REVIEW OF SYSTEMS: ROS:   GEN- Constitutional: HENT: GEN - Endocrine: Reports symptoms of LUNGS/Respiratory:  HEART/Cardiovascular: Reports symptoms of ABD/Gastrointestinal:   Musculoskeletal (BJE): +++++      leg/thigh pain or problems    leg cramps   NEURO/Neurological: PSYCH/Psychiatric:    Is the pt oriented to time, place, person? yes  Mood depressed __ normal  agitated __  TOBACCO: Smoker Status:     Tobacco use:     Tobacco cessation:          Smoker Status:   Never smoker   ICD10: Z87.898 ICD9: V13.89 Onset: 08/31/2014 09:59  SOCIAL HISTORY: Starter Pick List - Social History  FAMILY HISTORY:  Family History - 1st Degree Relatives:  Mother dead.  ALLERGIES:Drug Allergies.  No Known.   Starter - Allergies -  Summary:  PHYSICAL EXAMINATION: VS: BMI: 22.3.  BP: 144/65.  H: 61.00 in.  P: 67 /min.  W: 118lbs 0oz.    Va     OD cc 20/25-2 OS cc 20/50 ph NI  EYEGLASSES:  OD  +2.50 +1.00 @11  OS  +0.25 +1.00 @170  ADD: +3.00  MR  10/21/2014 10:25  OD: +2.50 +1.00 x023 20/25-1 OS: plano +1.50 x151 20/40 ADD: +2.75  K's OD: 46.75 47.00  OS: 47.00 47.25  VF:   OD full in all four quadrants OS full in all four quadrants  Motility full  PUPILS: 2 mm OD         3 mm OS     -mg  EYELIDS & OCULAR ADNEXA   increase upper lid tissue  SLE: Conjunctiva   quiet  Cornea  arcus OU    anterior chamber  deep and quiet  OU  Iris brown  Lens 1 to 2  nuclear  sclerosis  OD    2+  nuclear  sclerosis  OS  PXF OU  Vitreous   CCT  Ta   in mmHg    OD 16  OS 22 Time 10/21/2014 11:06   Gonio OD angle open 360 to s scleral spur   OS convex iris angle open to  scleral spur  more narrow than OD    Dilation    Fundus:  optic nerve  OD    40% cup                                                                  OS  45-50% cup difficult view    Macula      OD    dull                                                OS dull  Vessels narrow   Periphery drusen OU     Exam: GENERAL: Appearance: HEAD, EARS, NOSE AND THROAT: Ears-Nose (external) Inspection: Externally, nose and ears are normal in appearance and without scars, lesions, or nodules.      Hearing assessment shows no problems with normal conversation.      LUNGS and RESPIRATORY: Lung auscultation elicits no wheezing, rhonci, rales or rubs and with equal breath sounds.    Respiratory effort described as breathing is unlabored and chest movement is symmetrical.    HEART (Cardiovascular): Heart auscultation discovers regular rate and rhythm; no murmur, gallop or rub. Normal heart sounds.    ABDOMEN (Gastrointestinal): Mass/Tenderness Exam: Neither are present.     MUSCULOSKELETAL (BJE): Inspection-Palpation: No major bone,  joint, tendon, or muscle changes.      NEUROLOGICAL: Alert and oriented. No major deficits of coordination or sensation.      PSYCHIATRIC: Insight and judgment appear  both to be intact and appropriate.    Mood and affect are described as normal mood and full affect.    SKIN: Skin Inspection: No rashes or lesions  ADMITTING DIAGNOSIS: Pseudoexfoliation, lens capsule   ICD10:   ICD9: 366.11  Onset: 10/21/2014 11:57   Drusen (degenerative) of macula, bilateral   ICD10: H35.363  ICD9:   Onset: 08/31/2014 12:04  Initial Date:    Age-related nuclear cataract, bilateral   ICD10: H25.13  ICD9:   Onset: 08/31/2014 10:24  Initial Date:   worse OS causing progressive angle narrowing w EIOP OS  Dry eye syndrome   ICD10: H04.129  ICD9: 375.15  Onset: 08/31/2014 10:24  SURGICAL TREATMENT PLAN: phaco emulsion cataract extraction  with  intraocular lens implant  OS   Risk and benefits of surgery have been reviewed with the patient and the patient agrees to proceed with the surgical procedure.   ascan now Actions:     Handouts: New Handout, What is a cataract?.    ___________________________ Marylynn Pearson, Brooke Bonito. Breast cancer   ICD10: [id number]  ICD9: 174.9  Onset: 08/31/2014 10:36  Resolved: 08/31/2014  10:36   Starter - Inactive Problems:

## 2014-10-27 ENCOUNTER — Encounter: Payer: Self-pay | Admitting: Family Medicine

## 2014-10-27 ENCOUNTER — Ambulatory Visit (INDEPENDENT_AMBULATORY_CARE_PROVIDER_SITE_OTHER): Payer: Medicare Other | Admitting: Family Medicine

## 2014-10-27 ENCOUNTER — Encounter (HOSPITAL_COMMUNITY): Payer: Self-pay | Admitting: *Deleted

## 2014-10-27 VITALS — BP 140/78 | HR 69 | Resp 16 | Ht 61.0 in | Wt 121.0 lb

## 2014-10-27 DIAGNOSIS — IMO0001 Reserved for inherently not codable concepts without codable children: Secondary | ICD-10-CM

## 2014-10-27 DIAGNOSIS — I10 Essential (primary) hypertension: Secondary | ICD-10-CM

## 2014-10-27 DIAGNOSIS — M810 Age-related osteoporosis without current pathological fracture: Secondary | ICD-10-CM | POA: Diagnosis not present

## 2014-10-27 DIAGNOSIS — J309 Allergic rhinitis, unspecified: Secondary | ICD-10-CM

## 2014-10-27 DIAGNOSIS — E559 Vitamin D deficiency, unspecified: Secondary | ICD-10-CM | POA: Diagnosis not present

## 2014-10-27 DIAGNOSIS — E1065 Type 1 diabetes mellitus with hyperglycemia: Secondary | ICD-10-CM | POA: Diagnosis not present

## 2014-10-27 DIAGNOSIS — E109 Type 1 diabetes mellitus without complications: Secondary | ICD-10-CM | POA: Diagnosis not present

## 2014-10-27 DIAGNOSIS — K219 Gastro-esophageal reflux disease without esophagitis: Secondary | ICD-10-CM | POA: Diagnosis not present

## 2014-10-27 DIAGNOSIS — E1165 Type 2 diabetes mellitus with hyperglycemia: Secondary | ICD-10-CM

## 2014-10-27 DIAGNOSIS — Z794 Long term (current) use of insulin: Secondary | ICD-10-CM

## 2014-10-27 LAB — COMPLETE METABOLIC PANEL WITH GFR
ALBUMIN: 4 g/dL (ref 3.5–5.2)
ALT: 16 U/L (ref 0–35)
AST: 22 U/L (ref 0–37)
Alkaline Phosphatase: 83 U/L (ref 39–117)
BUN: 20 mg/dL (ref 6–23)
CALCIUM: 9.7 mg/dL (ref 8.4–10.5)
CHLORIDE: 101 meq/L (ref 96–112)
CO2: 29 meq/L (ref 19–32)
Creat: 0.98 mg/dL (ref 0.50–1.10)
GFR, Est African American: 64 mL/min
GFR, Est Non African American: 55 mL/min — ABNORMAL LOW
Glucose, Bld: 128 mg/dL — ABNORMAL HIGH (ref 70–99)
Potassium: 4.9 mEq/L (ref 3.5–5.3)
Sodium: 137 mEq/L (ref 135–145)
Total Bilirubin: 0.4 mg/dL (ref 0.2–1.2)
Total Protein: 6.4 g/dL (ref 6.0–8.3)

## 2014-10-27 LAB — HEMOGLOBIN A1C
Hgb A1c MFr Bld: 9.8 % — ABNORMAL HIGH (ref <5.7)
Mean Plasma Glucose: 235 mg/dL — ABNORMAL HIGH (ref <117)

## 2014-10-27 LAB — GLUCOSE, POCT (MANUAL RESULT ENTRY)

## 2014-10-27 MED ORDER — PHENYLEPHRINE HCL 2.5 % OP SOLN
1.0000 [drp] | OPHTHALMIC | Status: AC
  Administered 2014-10-28: 1 [drp] via OPHTHALMIC
  Filled 2014-10-27: qty 2

## 2014-10-27 MED ORDER — CYCLOPENTOLATE HCL 1 % OP SOLN
1.0000 [drp] | OPHTHALMIC | Status: AC
  Administered 2014-10-28: 1 [drp] via OPHTHALMIC
  Filled 2014-10-27: qty 2

## 2014-10-27 MED ORDER — GATIFLOXACIN 0.5 % OP SOLN
1.0000 [drp] | OPHTHALMIC | Status: AC | PRN
  Administered 2014-10-28 (×3): 1 [drp] via OPHTHALMIC
  Filled 2014-10-27: qty 2.5

## 2014-10-27 MED ORDER — TROPICAMIDE 1 % OP SOLN
1.0000 [drp] | OPHTHALMIC | Status: AC
  Administered 2014-10-28: 1 [drp] via OPHTHALMIC
  Filled 2014-10-27: qty 3

## 2014-10-27 NOTE — Progress Notes (Signed)
   Subjective:    Patient ID: Emily Jennings, female    DOB: [birth year], 81 y.o.   MRN: [id number]  HPI Takes 20 units lantus and glipizide  10mg  every day Blood sugar is varying, often over 200, c/o polyuria, polydipsia and dry mouth Has cataract surgery tomorrow She is having a lot of stress since her great miece has been missing for over 45 days  Review of Systems See HPI Denies recent fever or chills. Denies sinus pressure, nasal congestion, ear pain or sore throat. Denies chest congestion, productive cough or wheezing. Denies chest pains, palpitations and leg swelling Denies abdominal pain, nausea, vomiting,diarrhea or constipation.   Denies dysuria, frequency, hesitancy or incontinence. Denies joint pain, swelling and limitation in mobility. Denies headaches, seizures, numbness, or tingling. C/o increased depression, anxiety and  insomnia. C/o dark  Rash on lower lip, has used multiple different topical agents and keeps biting at the area        Objective:   Physical Exam BP 140/78 mmHg  Pulse 69  Resp 16  Ht 5\' 1"  (1.549 m)  Wt 121 lb (54.885 kg)  BMI 22.87 kg/m2  SpO2 96% Patient alert and oriented and in no cardiopulmonary distress.  HEENT: No facial asymmetry, EOMI,   oropharynx pink and moist.  Neck supple no JVD, no mass.  Chest: Clear to auscultation bilaterally.  CVS: S1, S2 no murmurs, no S3.Regular rate.  ABD: Soft non tender.   Ext: No edema  MS: Adequate ROM spine, shoulders, hips and knees.  Skin: Intact,hyperpigmented rash noted.  Psych: Good eye contact, normal affect. Memory intact mildly anxious and  depressed appearing.  CNS: CN 2-12 intact, power,  normal throughout.no focal deficits noted.        Assessment & Plan:  Essential hypertension Fairly good control, and thougj not at goal currently, no med chnages DASH diet and commitment to daily physical activity for a minimum of 30 minutes discussed and encouraged, as a part of  hypertension management. The importance of attaining a healthy weight is also discussed.    Allergic rhinitis Controlled, no change in medication    GERD (gastroesophageal reflux disease) Controlled, no change in medication    Diabetes mellitus, insulin dependent (IDDM), uncontrolled deteriorated , despite attempts in house to improve management with weekly tele calls Patient advised to reduce carb and sweets, commit to regular physical activity, take meds as prescribed, test blood as directed, and attempt to lose weight, to improve blood sugar control. Needs to return to endo for follow up, has upcoming appt supposedly   Osteoporosis Controlled, no change in medication

## 2014-10-27 NOTE — Patient Instructions (Signed)
Annual physical exam in 3 month, call if you need me before\   All the best with eye surgery tomorrow  cmp and EGFR, HBA1C, vit D today   No changes in medication  You and your family are in my thoughts and prayeras 

## 2014-10-28 ENCOUNTER — Ambulatory Visit (HOSPITAL_COMMUNITY): Payer: Medicare Other | Admitting: Anesthesiology

## 2014-10-28 ENCOUNTER — Encounter (HOSPITAL_COMMUNITY): Payer: Self-pay | Admitting: Anesthesiology

## 2014-10-28 ENCOUNTER — Ambulatory Visit (HOSPITAL_COMMUNITY)
Admission: RE | Admit: 2014-10-28 | Discharge: 2014-10-28 | Disposition: A | Discharge status: Home / Self Care | Payer: Medicare Other | Source: Ambulatory Visit | Attending: Ophthalmology | Admitting: Ophthalmology

## 2014-10-28 ENCOUNTER — Encounter (HOSPITAL_COMMUNITY)
Admission: RE | Disposition: A | Discharge status: Home / Self Care | Payer: Self-pay | Source: Ambulatory Visit | Attending: Ophthalmology

## 2014-10-28 DIAGNOSIS — H2512 Age-related nuclear cataract, left eye: Secondary | ICD-10-CM | POA: Diagnosis not present

## 2014-10-28 DIAGNOSIS — Z794 Long term (current) use of insulin: Secondary | ICD-10-CM | POA: Insufficient documentation

## 2014-10-28 DIAGNOSIS — K219 Gastro-esophageal reflux disease without esophagitis: Secondary | ICD-10-CM | POA: Diagnosis not present

## 2014-10-28 DIAGNOSIS — I10 Essential (primary) hypertension: Secondary | ICD-10-CM | POA: Diagnosis not present

## 2014-10-28 DIAGNOSIS — H2513 Age-related nuclear cataract, bilateral: Secondary | ICD-10-CM | POA: Diagnosis not present

## 2014-10-28 DIAGNOSIS — I2510 Atherosclerotic heart disease of native coronary artery without angina pectoris: Secondary | ICD-10-CM | POA: Diagnosis not present

## 2014-10-28 DIAGNOSIS — M1990 Unspecified osteoarthritis, unspecified site: Secondary | ICD-10-CM | POA: Diagnosis not present

## 2014-10-28 DIAGNOSIS — E119 Type 2 diabetes mellitus without complications: Secondary | ICD-10-CM | POA: Insufficient documentation

## 2014-10-28 HISTORY — PX: CATARACT EXTRACTION W/PHACO: SHX586

## 2014-10-28 HISTORY — DX: Other complications of anesthesia, initial encounter: T88.59XA

## 2014-10-28 HISTORY — DX: Adverse effect of unspecified anesthetic, initial encounter: T41.45XA

## 2014-10-28 HISTORY — DX: Unilateral inguinal hernia, without obstruction or gangrene, not specified as recurrent: K40.90

## 2014-10-28 LAB — BASIC METABOLIC PANEL
ANION GAP: 9 (ref 5–15)
BUN: 20 mg/dL (ref 6–23)
CO2: 27 mmol/L (ref 19–32)
Calcium: 9.8 mg/dL (ref 8.4–10.5)
Chloride: 104 mmol/L (ref 96–112)
Creatinine, Ser: 1.17 mg/dL — ABNORMAL HIGH (ref 0.50–1.10)
GFR calc non Af Amer: 43 mL/min — ABNORMAL LOW (ref >90)
GFR, EST AFRICAN AMERICAN: 50 mL/min — AB (ref >90)
Glucose, Bld: 55 mg/dL — ABNORMAL LOW (ref 70–99)
POTASSIUM: 4.3 mmol/L (ref 3.5–5.1)
Sodium: 140 mmol/L (ref 135–145)

## 2014-10-28 LAB — VITAMIN D 25 HYDROXY (VIT D DEFICIENCY, FRACTURES): Vit D, 25-Hydroxy: 33 ng/mL (ref 30–100)

## 2014-10-28 LAB — CBC
HEMATOCRIT: 38.4 % (ref 36.0–46.0)
HEMOGLOBIN: 12.8 g/dL (ref 12.0–15.0)
MCH: 30.1 pg (ref 26.0–34.0)
MCHC: 33.3 g/dL (ref 30.0–36.0)
MCV: 90.4 fL (ref 78.0–100.0)
Platelets: 262 10*3/uL (ref 150–400)
RBC: 4.25 MIL/uL (ref 3.87–5.11)
RDW: 13.4 % (ref 11.5–15.5)
WBC: 5.7 10*3/uL (ref 4.0–10.5)

## 2014-10-28 LAB — GLUCOSE, CAPILLARY
Glucose-Capillary: 50 mg/dL — ABNORMAL LOW (ref 70–99)
Glucose-Capillary: 116 mg/dL — ABNORMAL HIGH (ref 70–99)

## 2014-10-28 SURGERY — PHACOEMULSIFICATION, CATARACT, WITH IOL INSERTION
Anesthesia: Monitor Anesthesia Care | Site: Eye | Laterality: Left

## 2014-10-28 MED ORDER — SODIUM CHLORIDE 0.9 % IV SOLN
INTRAVENOUS | Status: DC | PRN
  Administered 2014-10-28: 08:00:00 via INTRAVENOUS

## 2014-10-28 MED ORDER — NA CHONDROIT SULF-NA HYALURON 40-30 MG/ML IO SOLN
INTRAOCULAR | Status: AC
  Filled 2014-10-28: qty 0.5

## 2014-10-28 MED ORDER — 0.9 % SODIUM CHLORIDE (POUR BTL) OPTIME
TOPICAL | Status: DC | PRN
  Administered 2014-10-28: 1000 mL

## 2014-10-28 MED ORDER — DEXTROSE 50 % IV SOLN
INTRAVENOUS | Status: AC
  Administered 2014-10-28: 25 mL
  Filled 2014-10-28: qty 50

## 2014-10-28 MED ORDER — FENTANYL CITRATE 0.05 MG/ML IJ SOLN
INTRAMUSCULAR | Status: DC | PRN
  Administered 2014-10-28 (×4): 25 ug via INTRAVENOUS

## 2014-10-28 MED ORDER — GLYCOPYRROLATE 0.2 MG/ML IJ SOLN
INTRAMUSCULAR | Status: DC | PRN
  Administered 2014-10-28: 0.2 mg via INTRAVENOUS

## 2014-10-28 MED ORDER — LIDOCAINE HCL (PF) 1 % IJ SOLN
INTRAMUSCULAR | Status: DC | PRN
  Administered 2014-10-28: 30 mL

## 2014-10-28 MED ORDER — TETRACAINE HCL 0.5 % OP SOLN
OPHTHALMIC | Status: AC
  Filled 2014-10-28: qty 2

## 2014-10-28 MED ORDER — NA CHONDROIT SULF-NA HYALURON 40-30 MG/ML IO SOLN
INTRAOCULAR | Status: DC | PRN
  Administered 2014-10-28 (×2): 0.5 mL via INTRAOCULAR

## 2014-10-28 MED ORDER — PROPOFOL 10 MG/ML IV BOLUS
INTRAVENOUS | Status: AC
  Filled 2014-10-28: qty 20

## 2014-10-28 MED ORDER — SODIUM HYALURONATE 10 MG/ML IO SOLN
INTRAOCULAR | Status: AC
  Filled 2014-10-28: qty 0.85

## 2014-10-28 MED ORDER — LIDOCAINE HCL (CARDIAC) 20 MG/ML IV SOLN
INTRAVENOUS | Status: AC
  Filled 2014-10-28: qty 5

## 2014-10-28 MED ORDER — BUPIVACAINE HCL (PF) 0.75 % IJ SOLN
INTRAMUSCULAR | Status: AC
  Filled 2014-10-28: qty 10

## 2014-10-28 MED ORDER — SODIUM HYALURONATE 10 MG/ML IO SOLN
INTRAOCULAR | Status: DC | PRN
  Administered 2014-10-28: 0.85 mL via INTRAOCULAR

## 2014-10-28 MED ORDER — ONDANSETRON HCL 4 MG/2ML IJ SOLN
INTRAMUSCULAR | Status: AC
  Filled 2014-10-28: qty 2

## 2014-10-28 MED ORDER — TETRACAINE HCL 0.5 % OP SOLN
OPHTHALMIC | Status: DC | PRN
  Administered 2014-10-28: 2 [drp] via OPHTHALMIC

## 2014-10-28 MED ORDER — FENTANYL CITRATE 0.05 MG/ML IJ SOLN
INTRAMUSCULAR | Status: AC
  Filled 2014-10-28: qty 5

## 2014-10-28 MED ORDER — ACETYLCHOLINE CHLORIDE 1:100 IO SOLR
INTRAOCULAR | Status: AC
  Filled 2014-10-28: qty 1

## 2014-10-28 MED ORDER — SODIUM CHLORIDE 0.9 % IV SOLN
INTRAVENOUS | Status: DC
  Administered 2014-10-28: 08:00:00 via INTRAVENOUS

## 2014-10-28 MED ORDER — LIDOCAINE HCL 2 % IJ SOLN
INTRAMUSCULAR | Status: AC
  Filled 2014-10-28: qty 20

## 2014-10-28 MED ORDER — PHENYLEPHRINE HCL 2.5 % OP SOLN
1.0000 [drp] | OPHTHALMIC | Status: AC | PRN
  Administered 2014-10-28 (×2): 1 [drp] via OPHTHALMIC

## 2014-10-28 MED ORDER — DEXAMETHASONE SODIUM PHOSPHATE 10 MG/ML IJ SOLN
INTRAMUSCULAR | Status: AC
  Filled 2014-10-28: qty 1

## 2014-10-28 MED ORDER — TOBRAMYCIN 0.3 % OP OINT
TOPICAL_OINTMENT | OPHTHALMIC | Status: DC | PRN
  Administered 2014-10-28: 1 via OPHTHALMIC

## 2014-10-28 MED ORDER — PROPOFOL 10 MG/ML IV BOLUS
INTRAVENOUS | Status: DC | PRN
  Administered 2014-10-28 (×2): 20 mg via INTRAVENOUS

## 2014-10-28 MED ORDER — BSS IO SOLN
INTRAOCULAR | Status: AC
  Filled 2014-10-28: qty 500

## 2014-10-28 MED ORDER — HYALURONIDASE HUMAN 150 UNIT/ML IJ SOLN
INTRAMUSCULAR | Status: AC
  Filled 2014-10-28: qty 1

## 2014-10-28 MED ORDER — ACETYLCHOLINE CHLORIDE 1:100 IO SOLR
INTRAOCULAR | Status: DC | PRN
  Administered 2014-10-28: 10 mg via INTRAOCULAR

## 2014-10-28 MED ORDER — LIDOCAINE HCL (CARDIAC) 20 MG/ML IV SOLN
INTRAVENOUS | Status: DC | PRN
  Administered 2014-10-28 (×2): 10 mg via INTRAVENOUS

## 2014-10-28 MED ORDER — LIDOCAINE-EPINEPHRINE 2 %-1:100000 IJ SOLN
INTRAMUSCULAR | Status: DC | PRN
  Administered 2014-10-28: 5 mL via RETROBULBAR

## 2014-10-28 MED ORDER — BSS IO SOLN
INTRAOCULAR | Status: DC | PRN
  Administered 2014-10-28: 15 mL via INTRAOCULAR

## 2014-10-28 MED ORDER — TROPICAMIDE 1 % OP SOLN
1.0000 [drp] | OPHTHALMIC | Status: DC | PRN
  Administered 2014-10-28: 1 [drp] via OPHTHALMIC

## 2014-10-28 MED ORDER — GENTAMICIN SULFATE 40 MG/ML IJ SOLN
INTRAMUSCULAR | Status: AC
  Filled 2014-10-28: qty 2

## 2014-10-28 MED ORDER — BSS IO SOLN
INTRAOCULAR | Status: AC
  Filled 2014-10-28: qty 15

## 2014-10-28 MED ORDER — EPINEPHRINE HCL 1 MG/ML IJ SOLN
INTRAMUSCULAR | Status: AC
  Filled 2014-10-28: qty 1

## 2014-10-28 MED ORDER — PILOCARPINE HCL 4 % OP SOLN
OPHTHALMIC | Status: AC
  Filled 2014-10-28: qty 15

## 2014-10-28 MED ORDER — EPINEPHRINE HCL 1 MG/ML IJ SOLN
INTRAOCULAR | Status: DC | PRN
  Administered 2014-10-28: 08:00:00

## 2014-10-28 MED ORDER — LIDOCAINE HCL (PF) 1 % IJ SOLN
INTRAMUSCULAR | Status: AC
Start: 2014-10-28 — End: 2014-10-28
  Filled 2014-10-28: qty 30

## 2014-10-28 MED ORDER — LIDOCAINE-EPINEPHRINE 2 %-1:100000 IJ SOLN
INTRAMUSCULAR | Status: AC
  Filled 2014-10-28: qty 1

## 2014-10-28 MED ORDER — TOBRAMYCIN-DEXAMETHASONE 0.3-0.1 % OP OINT
TOPICAL_OINTMENT | OPHTHALMIC | Status: AC
  Filled 2014-10-28: qty 3.5

## 2014-10-28 SURGICAL SUPPLY — 39 items
APL SRG 3 HI ABS STRL LF PLS (MISCELLANEOUS) ×1
APPLICATOR COTTON TIP 6IN STRL (MISCELLANEOUS) ×2 IMPLANT
APPLICATOR DR MATTHEWS STRL (MISCELLANEOUS) ×2 IMPLANT
BLADE KERATOME 2.75 (BLADE) ×2 IMPLANT
CANNULA ANTERIOR CHAMBER 27GA (MISCELLANEOUS) ×2 IMPLANT
CORDS BIPOLAR (ELECTRODE) IMPLANT
COVER MAYO STAND STRL (DRAPES) ×2 IMPLANT
DRAPE OPHTHALMIC 40X48 W POUCH (DRAPES) ×2 IMPLANT
DRAPE RETRACTOR (MISCELLANEOUS) ×2 IMPLANT
GLOVE BIO SURGEON STRL SZ7 (GLOVE) ×2 IMPLANT
GLOVE BIO SURGEON STRL SZ7.5 (GLOVE) ×1 IMPLANT
GLOVE BIO SURGEON STRL SZ8 (GLOVE) ×2 IMPLANT
GLOVE BIOGEL PI IND STRL 7.0 (GLOVE) IMPLANT
GLOVE BIOGEL PI INDICATOR 7.0 (GLOVE) ×1
GOWN STRL REUS W/ TWL LRG LVL3 (GOWN DISPOSABLE) ×2 IMPLANT
GOWN STRL REUS W/TWL LRG LVL3 (GOWN DISPOSABLE) ×4
KIT BASIN OR (CUSTOM PROCEDURE TRAY) ×2 IMPLANT
KIT ROOM TURNOVER OR (KITS) ×2 IMPLANT
LENS IOL ACRSF IQ PC 20.5 (Intraocular Lens) IMPLANT
LENS IOL ACRYSOF IQ POST 20.5 (Intraocular Lens) ×2 IMPLANT
NDL 18GX1X1/2 (RX/OR ONLY) (NEEDLE) ×1 IMPLANT
NDL 25GX 5/8IN NON SAFETY (NEEDLE) ×1 IMPLANT
NDL FILTER BLUNT 18X1 1/2 (NEEDLE) ×1 IMPLANT
NEEDLE 18GX1X1/2 (RX/OR ONLY) (NEEDLE) ×2 IMPLANT
NEEDLE 25GX 5/8IN NON SAFETY (NEEDLE) ×2 IMPLANT
NEEDLE FILTER BLUNT 18X 1/2SAF (NEEDLE) ×1
NEEDLE FILTER BLUNT 18X1 1/2 (NEEDLE) ×1 IMPLANT
NS IRRIG 1000ML POUR BTL (IV SOLUTION) ×2 IMPLANT
PACK CATARACT CUSTOM (CUSTOM PROCEDURE TRAY) ×2 IMPLANT
PAD ARMBOARD 7.5X6 YLW CONV (MISCELLANEOUS) ×2 IMPLANT
PAK PIK CVS CATARACT (OPHTHALMIC) ×2 IMPLANT
RING MALYGIN (MISCELLANEOUS) ×1 IMPLANT
SUT ETHILON 10 0 CS140 6 (SUTURE) IMPLANT
SUT SILK 6 0 G 6 (SUTURE) IMPLANT
SYR TB 1ML LUER SLIP (SYRINGE) ×2 IMPLANT
TIP ABS 45DEG FLARED 0.9MM (TIP) ×2 IMPLANT
TOWEL OR 17X26 10 PK STRL BLUE (TOWEL DISPOSABLE) ×2 IMPLANT
WATER STERILE IRR 1000ML POUR (IV SOLUTION) ×2 IMPLANT
WIPE INSTRUMENT VISIWIPE 73X73 (MISCELLANEOUS) ×2 IMPLANT

## 2014-10-28 NOTE — Op Note (Signed)
Preoperative diagnosis: Visually significant cataract with pseudoexfoliation and poorly dilating pupil left eye Postop diagnosis: Same Procedure: Phacoemulsification with intraocular lens implant using Mylugin ranging a special device to dilate pupil Anesthesia: 2% Xylocaine with epinephrine in a 50-50 mixture 0.75% Marcaine with ample Wydase Combinations: None Procedure: The patient was transported to the operating room where she was given a peribulbar block with the aforementioned local anesthetic agent. Following this the patient's face prepped and draped in usual sterile fashion with a surgeon sitting temporally and the operating microscope in position it was noted that the pupil measured 6 mm therefore Weck-Cel sponge was used to fixate the globe and a 15 blade was used to enter through inferior clear cornea Viscoat was injected into the anterior chamber following this with an additional Weck-Cel sponge to fixate the globe a 2.75 mm keratome blade was used in a stepwise fashion through temporal clear cornea to into the anterior chamber following this it was noted that the pupil had constricted to 5.5 mm. Therefore it was necessary to inject additional Viscoat and positioned a Mylugin ring to expand the pupil to 6.2 mm. Following this a bent 25-gauge needle was used to incise anterior capsule and a continuous tear curvilinear capsulorrhexis was formed. BSS was then used to hydrodissect the nucleus the chamber was noted to shallow at this point additional Viscoat was injected and the nucleus was loosened within the capsular bag and hydrodissection took place. Following this the phacoemulsification unit was then used to remove the epinucleus the nucleus was sculpted centrally then using a Kuglen hook the nucleus was separated also using a snapper Hook the nucleus was divided into 3 quadrants all nuclear fragments were then removed from the eye and the posterior capsule remained intact the  irrigation-aspiration handpiece was used to strip cortical fibers from the very floppy posterior capsule. The capsule remained intact and the eye was expanded with Provisc. The intraocular lens implant was then examined and noted to have no defects the lens was an Alcon AcrySof SN 60 WF IQ lens 20.5 dpt SN number [id number] the lens was placed in the lens injector and injected into the capsular bag and positioned with a Kuglen hook following this the Kuglen hook was used to separate the eyelets of the ring the ring was then removed with a ring retractor. The irrigation-aspiration handpiece was then used to remove viscoelastic from the eye. Following this the eye was pressurized a single 10-0 nylon suture was placed at the incision and there being no leakage all instruments were removed from the eye topical Tobradex ointment was applied to the eye a patch and Fox U were placed and the patient returned to recovery area in stable condition. Marylynn Pearson Junior M.D.

## 2014-10-28 NOTE — Op Note (Signed)
Preoperative diagnosis: Visually significant cataract with pseudoexfoliation and poorly dilating pupil left eye Postop diagnosis: Same Procedure: Phacoemulsification with intraocular lens implant using Mylugin ranging a special device to dilate pupil Anesthesia: 2% Xylocaine with epinephrine in a 50-50 mixture 0.75% Marcaine with ample Wydase Combinations: None Procedure: The patient was transported to the operating room where she was given a peribulbar block with the aforementioned local anesthetic agent. Following this the patient's face prepped and draped in usual sterile fashion with a surgeon sitting temporally and the operating microscope in position it was noted that the pupil measured 6 mm therefore Weck-Cel sponge was used to fixate the globe and a 15 blade was used to enter through inferior clear cornea Viscoat was injected into the anterior chamber following this with an additional Weck-Cel sponge to fixate the globe a 2.75 mm keratome blade was used in a stepwise fashion through temporal clear cornea to into the anterior chamber following this it was noted that the pupil had constricted to 5.5 mm. Therefore it was necessary to inject additional Viscoat and positioned a Mylugin ring to expand the pupil to 6.2 mm. Following this a bent 25-gauge needle was used to incise anterior capsule and a continuous tear curvilinear capsulorrhexis was formed. BSS was then used to hydrodissect the nucleus the chamber was noted to shallow at this point additional Viscoat was injected and the nucleus was loosened within the capsular bag and hydrodissection took place. Following this the phacoemulsification unit was then used to remove the epinucleus the nucleus was sculpted centrally then using a Kuglen hook the nucleus was separated also using a snapper Hook the nucleus was divided into 3 quadrants all nuclear fragments were then removed from the eye and the posterior capsule remained intact the  irrigation-aspiration handpiece was used to strip cortical fibers from the very floppy posterior capsule. The capsule remained intact and the eye was expanded with Provisc. The intraocular lens implant was then examined and noted to have no defects the lens was an Alcon AcrySof SN 60 WF IQ lens 20.5 dpt SN number [id number] the lens was placed in the lens injector and injected into the capsular bag and positioned with a Kuglen hook following this the Kuglen hook was used to separate the eyelets of the ring the ring was then removed with a ring retractor. The irrigation-aspiration handpiece was then used to remove viscoelastic from the eye. Following this the eye was pressurized a single 10-0 nylon suture was placed at the incision and there being no leakage all instruments were removed from the eye topical Tobradex ointment was applied to the eye a patch and Fox U were placed and the patient returned to recovery area in stable condition. Marylynn Pearson Junior M.D.

## 2014-10-28 NOTE — Anesthesia Preprocedure Evaluation (Addendum)
Anesthesia Evaluation  Patient identified by MRN, date of birth, ID band Patient awake    Reviewed: Allergy & Precautions, NPO status , Patient's Chart, lab work & pertinent test results  Airway Mallampati: II  TM Distance: >3 FB Neck ROM: Full    Dental  (+) Upper Dentures   Pulmonary  breath sounds clear to auscultation        Cardiovascular hypertension, Pt. on medications and Pt. on home beta blockers + CAD and + Peripheral Vascular Disease Rhythm:Regular Rate:Normal     Neuro/Psych negative neurological ROS     GI/Hepatic Neg liver ROS, GERD-  ,  Endo/Other  diabetes, Type 2, Insulin Dependent  Renal/GU CRFRenal disease     Musculoskeletal   Abdominal   Peds  Hematology negative hematology ROS (+)   Anesthesia Other Findings   Reproductive/Obstetrics                            Anesthesia Physical Anesthesia Plan  ASA: III  Anesthesia Plan: MAC   Post-op Pain Management:    Induction: Intravenous  Airway Management Planned: Natural Airway and Simple Face Mask  Additional Equipment:   Intra-op Plan:   Post-operative Plan:   Informed Consent: I have reviewed the patients History and Physical, chart, labs and discussed the procedure including the risks, benefits and alternatives for the proposed anesthesia with the patient or authorized representative who has indicated his/her understanding and acceptance.     Plan Discussed with: CRNA  Anesthesia Plan Comments:         Anesthesia Quick Evaluation

## 2014-10-28 NOTE — Transfer of Care (Signed)
Immediate Anesthesia Transfer of Care Note  Patient: Emily Jennings  Procedure(s) Performed: Procedure(s): PHACO EMULSION CATARACT EXTRACTION WITH INTRAOCULAR LENS IMPLANT LEFT EYE (IOC) (Left)  Patient Location: PACU  Anesthesia Type:MAC  Level of Consciousness: awake, alert , oriented and sedated  Airway & Oxygen Therapy: Patient Spontanous Breathing  Post-op Assessment: Report given to RN, Post -op Vital signs reviewed and stable and Patient moving all extremities  Post vital signs: Reviewed and stable  Last Vitals:  Filed Vitals:   10/28/14 0715  BP: 155/67  Pulse: 64  Temp: [id number] C    Complications: No apparent anesthesia complications

## 2014-10-28 NOTE — Anesthesia Postprocedure Evaluation (Signed)
  Anesthesia Post-op Note  Patient: Emily Jennings  Procedure(s) Performed: Procedure(s): PHACO EMULSION CATARACT EXTRACTION WITH INTRAOCULAR LENS IMPLANT LEFT EYE (IOC) (Left)  Patient Location: PACU  Anesthesia Type:MAC  Level of Consciousness: awake and alert   Airway and Oxygen Therapy: Patient Spontanous Breathing  Post-op Pain: none  Post-op Assessment: Post-op Vital signs reviewed  Post-op Vital Signs: Reviewed  Last Vitals:  Filed Vitals:   10/28/14 1030  BP: 129/61  Pulse: 68  Temp:   Resp: 16    Complications: No apparent anesthesia complications

## 2014-10-28 NOTE — Discharge Instructions (Signed)
The patient may remove the eye patch this afternoon at 4:00 PM. Instilled the eyedrops given to the patient at [place] office. The patient should sleep on her back or right side and do not apply any pressure to the eye. In the morning use the same eyedrops again. The patient should take her usual pain medication if the pain is not relieved the patient should call the doctor's office.

## 2014-10-28 NOTE — Interval H&P Note (Signed)
History and Physical Interval Note:  10/28/2014 8:21 AM  Emily Jennings  has presented today for surgery, with the diagnosis of AGE RELATED NUCLEAR CATATARACT LEFT EYE   The various methods of treatment have been discussed with the patient and family. After consideration of risks, benefits and other options for treatment, the patient has consented to  Procedure(s): PHACO EMULSION CATARACT EXTRACTION WITH INTRAOCULAR LENS IMPLANT LEFT EYE (IOC) (Left) as a surgical intervention .  The patient's history has been reviewed, patient examined, no change in status, stable for surgery.  I have reviewed the patient's chart and labs.  Questions were answered to the patient's satisfaction.     Tamu Golz

## 2014-10-28 NOTE — H&P (View-Only) (Signed)
History & Physical:   DATE:   10-21-14  NAME:  Emily Jennings, Emily Jennings      [name]       HISTORY OF PRESENT ILLNESS: Chief Eye Complaints blurry vision   blood sugar 95mg % this am. Va has decrease in OS, unable to read.   HPI: EYES: Reports symptoms of     LOCATION:   LEFT EYE        QUALITY/COURSE:   Reports condition is worsening.        INTENSITY/SEVERITY:    Reports measurement ( or degree) as moderate.      DURATION:   Reports the general length of symptoms to be months.     ACTIVE PROBLEMS: Pseudoexfoliation, lens capsule   ICD10:   ICD9: 366.11  Onset: 10/21/2014 11:57  Initial Date:    Drusen (degenerative) of macula, bilateral   ICD10: H35.363  ICD9:   Onset: 08/31/2014 12:04  Initial Date:    Age-related nuclear cataract, bilateral   ICD10: H25.13  ICD9:   Onset: 08/31/2014 10:24  Initial Date:   worse OS causing progressive angle narrowing w EIOP OS  Dry eye syndrome   ICD10: H04.129  ICD9: 375.15  Onset: 08/31/2014 10:24  Initial Date:  SURGERIES: Lumpectomy 2009 (left) Pick List - Surgeries  MEDICATIONS: Ilevro: 0.3% suspension SIG-  1 drop in OS QD   Ciloxan (Ciprofloxacin) Solution: 0.3% solution SIG-  1 drop in OS BID  REVIEW OF SYSTEMS: ROS:   GEN- Constitutional: HENT: GEN - Endocrine: Reports symptoms of LUNGS/Respiratory:  HEART/Cardiovascular: Reports symptoms of ABD/Gastrointestinal:   Musculoskeletal (BJE): +++++      leg/thigh pain or problems    leg cramps   NEURO/Neurological: PSYCH/Psychiatric:    Is the pt oriented to time, place, person? yes  Mood depressed __ normal  agitated __  TOBACCO: Smoker Status:     Tobacco use:     Tobacco cessation:          Smoker Status:   Never smoker   ICD10: Z87.898 ICD9: V13.89 Onset: 08/31/2014 09:59  SOCIAL HISTORY: Starter Pick List - Social History  FAMILY HISTORY:  Family History - 1st Degree Relatives:  Mother dead.  ALLERGIES:Drug Allergies.  No Known.   Starter - Allergies -  Summary:  PHYSICAL EXAMINATION: VS: BMI: 22.3.  BP: 144/65.  H: 61.00 in.  P: 67 /min.  W: 118lbs 0oz.    Va     OD cc 20/25-2 OS cc 20/50 ph NI  EYEGLASSES:  OD  +2.50 +1.00 @11  OS  +0.25 +1.00 @170  ADD: +3.00  MR  10/21/2014 10:25  OD: +2.50 +1.00 x023 20/25-1 OS: plano +1.50 x151 20/40 ADD: +2.75  K's OD: 46.75 47.00  OS: 47.00 47.25  VF:   OD full in all four quadrants OS full in all four quadrants  Motility full  PUPILS: 2 mm OD         3 mm OS     -mg  EYELIDS & OCULAR ADNEXA   increase upper lid tissue  SLE: Conjunctiva   quiet  Cornea  arcus OU    anterior chamber  deep and quiet  OU  Iris brown  Lens 1 to 2  nuclear  sclerosis  OD    2+  nuclear  sclerosis  OS  PXF OU  Vitreous   CCT  Ta   in mmHg    OD 16  OS 22 Time 10/21/2014 11:06   Gonio OD angle open 360 to s scleral spur   OS convex iris angle open to  scleral spur  more narrow than OD    Dilation    Fundus:  optic nerve  OD    40% cup                                                                  OS  45-50% cup difficult view    Macula      OD    dull                                                OS dull  Vessels narrow   Periphery drusen OU     Exam: GENERAL: Appearance: HEAD, EARS, NOSE AND THROAT: Ears-Nose (external) Inspection: Externally, nose and ears are normal in appearance and without scars, lesions, or nodules.      Hearing assessment shows no problems with normal conversation.      LUNGS and RESPIRATORY: Lung auscultation elicits no wheezing, rhonci, rales or rubs and with equal breath sounds.    Respiratory effort described as breathing is unlabored and chest movement is symmetrical.    HEART (Cardiovascular): Heart auscultation discovers regular rate and rhythm; no murmur, gallop or rub. Normal heart sounds.    ABDOMEN (Gastrointestinal): Mass/Tenderness Exam: Neither are present.     MUSCULOSKELETAL (BJE): Inspection-Palpation: No major bone,  joint, tendon, or muscle changes.      NEUROLOGICAL: Alert and oriented. No major deficits of coordination or sensation.      PSYCHIATRIC: Insight and judgment appear  both to be intact and appropriate.    Mood and affect are described as normal mood and full affect.    SKIN: Skin Inspection: No rashes or lesions  ADMITTING DIAGNOSIS: Pseudoexfoliation, lens capsule   ICD10:   ICD9: 366.11  Onset: 10/21/2014 11:57   Drusen (degenerative) of macula, bilateral   ICD10: H35.363  ICD9:   Onset: 08/31/2014 12:04  Initial Date:    Age-related nuclear cataract, bilateral   ICD10: H25.13  ICD9:   Onset: 08/31/2014 10:24  Initial Date:   worse OS causing progressive angle narrowing w EIOP OS  Dry eye syndrome   ICD10: H04.129  ICD9: 375.15  Onset: 08/31/2014 10:24  SURGICAL TREATMENT PLAN: phaco emulsion cataract extraction  with  intraocular lens implant  OS   Risk and benefits of surgery have been reviewed with the patient and the patient agrees to proceed with the surgical procedure.   ascan now Actions:     Handouts: New Handout, What is a cataract?.    ___________________________ Marylynn Pearson, Brooke Bonito. Breast cancer   ICD10: 8486361359  ICD9: 174.9  Onset: 08/31/2014 10:36  Resolved: 08/31/2014  10:36   Starter - Inactive Problems:

## 2014-10-30 ENCOUNTER — Encounter (HOSPITAL_COMMUNITY): Payer: Self-pay | Admitting: Ophthalmology

## 2014-11-08 NOTE — Assessment & Plan Note (Signed)
deteriorated , despite attempts in house to improve management with weekly tele calls Patient advised to reduce carb and sweets, commit to regular physical activity, take meds as prescribed, test blood as directed, and attempt to lose weight, to improve blood sugar control. Needs to return to endo for follow up, has upcoming appt supposedly

## 2014-11-08 NOTE — Assessment & Plan Note (Signed)
Controlled, no change in medication  

## 2014-11-08 NOTE — Assessment & Plan Note (Signed)
Fairly good control, and thougj not at goal currently, no med chnages DASH diet and commitment to daily physical activity for a minimum of 30 minutes discussed and encouraged, as a part of hypertension management. The importance of attaining a healthy weight is also discussed.

## 2014-11-23 ENCOUNTER — Other Ambulatory Visit: Payer: Self-pay | Admitting: Family Medicine

## 2014-12-01 ENCOUNTER — Telehealth: Payer: Self-pay

## 2014-12-01 ENCOUNTER — Other Ambulatory Visit: Payer: Self-pay | Admitting: Family Medicine

## 2014-12-01 NOTE — Telephone Encounter (Signed)
I will try ione more time with her but needs to comply, bring her meds to visit and clearly know what she is taking for the sugar and when, will need to test 3 times daily and write down  Start this today schedule appt for next week pls

## 2014-12-01 NOTE — Telephone Encounter (Signed)
I will try again ONLY with the understanding that she takes meds as prescribed

## 2014-12-02 NOTE — Telephone Encounter (Signed)
Called patient and she voiced understanding that she needs to comply with treatment and also check blood sugars 3x daily until she comes in for her appointment on 3/24.

## 2014-12-09 ENCOUNTER — Ambulatory Visit: Payer: Self-pay | Admitting: *Deleted

## 2014-12-10 ENCOUNTER — Encounter: Payer: Self-pay | Admitting: Family Medicine

## 2014-12-10 ENCOUNTER — Ambulatory Visit (INDEPENDENT_AMBULATORY_CARE_PROVIDER_SITE_OTHER): Payer: Medicare Other | Admitting: Family Medicine

## 2014-12-10 VITALS — BP 136/72 | HR 82 | Resp 16 | Ht 61.0 in | Wt 120.4 lb

## 2014-12-10 DIAGNOSIS — K219 Gastro-esophageal reflux disease without esophagitis: Secondary | ICD-10-CM

## 2014-12-10 DIAGNOSIS — E1065 Type 1 diabetes mellitus with hyperglycemia: Secondary | ICD-10-CM | POA: Diagnosis not present

## 2014-12-10 DIAGNOSIS — IMO0001 Reserved for inherently not codable concepts without codable children: Secondary | ICD-10-CM

## 2014-12-10 DIAGNOSIS — M25519 Pain in unspecified shoulder: Secondary | ICD-10-CM

## 2014-12-10 DIAGNOSIS — J3089 Other allergic rhinitis: Secondary | ICD-10-CM

## 2014-12-10 DIAGNOSIS — E1165 Type 2 diabetes mellitus with hyperglycemia: Principal | ICD-10-CM

## 2014-12-10 DIAGNOSIS — I10 Essential (primary) hypertension: Secondary | ICD-10-CM | POA: Diagnosis not present

## 2014-12-10 DIAGNOSIS — Z794 Long term (current) use of insulin: Principal | ICD-10-CM

## 2014-12-10 LAB — GLUCOSE, POCT (MANUAL RESULT ENTRY): POC GLUCOSE: 206 mg/dL — AB (ref 70–99)

## 2014-12-10 MED ORDER — INSULIN GLARGINE 100 UNIT/ML SOLOSTAR PEN: PEN_INJECTOR | SUBCUTANEOUS | Status: DC

## 2014-12-10 NOTE — Patient Instructions (Signed)
F/u in 2 weeks, call if you need me before  Need to eat on a regular schedule  Test and write on sheet, before breakfast, before lunch and at bedtime (10pm)  Bring sheet and meds to every visit  Goal for fasting blood sugar ranges from 90 to 140 , and goal for bedtime 140 to 180 Goal for before lunch,  100 to 200  New script for lantus is 25 units every morning, BUT stay on the 21 units for now  Need to attend diabetic class  It is important that you exercise regularly at least 30 minutes 5 times a week. If you develop chest pain, have severe difficulty breathing, or feel very tired, stop exercising immediately and seek medical attention   A healthy diet is rich in fruit, vegetables and whole grains. Poultry fish, nuts and beans are a healthy choice for protein rather then red meat. A low sodium diet and drinking 64 ounces of water daily is generally recommended. Oils and sweet should be limited. Carbohydrates especially for those who are diabetic or overweight, should be limited to 604-45 gram per meal. It is important to eat on a regular schedule, at least 3 times daily. Snacks should be primarily fruits, vegetables or nuts.

## 2014-12-12 ENCOUNTER — Telehealth: Payer: Self-pay | Admitting: Family Medicine

## 2014-12-12 DIAGNOSIS — M25519 Pain in unspecified shoulder: Secondary | ICD-10-CM | POA: Insufficient documentation

## 2014-12-12 NOTE — Assessment & Plan Note (Addendum)
Uncontrolled, pt in today stating that she will elect to se an endo in [place], rather than be followed in [place]. She clearly understands the need to follow specifically testing , recording, a carb counted diet and commit to regular exercise. He needs to and will return to local diabetiuc educator. Very low threshold for referral to endo , her understanding of her disease and ability to follow through is severely limited Counseled by myself as well as the nurse for 20 minutes  Patient educated about the importance of limiting  Carbohydrate intake , the need to commit to daily physical activity for a minimum of 30 minutes , and to commit weight loss. The fact that changes in all these areas will reduce or eliminate all together the development of diabetes is stressed.   Diabetic Labs Latest Ref Rng 10/28/2014 10/27/2014 07/15/2014 03/23/2014 03/19/2014  HbA1c <5.7 % - 9.8(H) 10.9(H) - 10.5(H)  Microalbumin 0.00 - 1.89 mg/dL - - - 68.65(H) -  Micro/Creat Ratio 0.0 - 30.0 mg/g - - - 597.0(H) -  Chol 0 - 200 mg/dL - - - - 173  HDL >39 mg/dL - - - - 83  Calc LDL 0 - 99 mg/dL - - - - 70  Triglycerides <150 mg/dL - - - - 101  Creatinine 0.50 - 1.10 mg/dL 1.17(H) 0.98 0.98 - 1.26(H)   BP/Weight 12/10/2014 10/28/2014 10/27/2014 07/17/2014 06/26/2014 123456 123456  Systolic BP XX123456 Q000111Q XX123456 0000000 123456 0000000 [id number]  Diastolic BP 72 61 78 72 74 78 68  Wt. (Lbs) 120.4 121 121 116 110.8 115.12 115.12  BMI 22.76 22.87 22.87 21.21 20.6 21.4 21.4   Foot/eye exam completion dates 03/19/2014 01/27/2013  Foot Form Completion Done Done

## 2014-12-12 NOTE — Assessment & Plan Note (Signed)
Tramadol to be sent ni for as needed use, 20 tabs prescribed, max of 1 daily

## 2014-12-12 NOTE — Assessment & Plan Note (Signed)
Increased symptoms with season change , daily flonase started

## 2014-12-12 NOTE — Assessment & Plan Note (Signed)
Controlled, no change in medication  

## 2014-12-12 NOTE — Telephone Encounter (Signed)
Pls fax in tramadol which I entered after her visit for her c/o shoulder pain and let her know Pls also ask and document in tele msg her reported fasting and bedtime sugars , and get a feel from her as to if things are improving with sugar, my threshold for referring her to [facility] per her specification is very low pls send me feedback, thanks!

## 2014-12-12 NOTE — Assessment & Plan Note (Signed)
Controlled, no change in medication DASH diet and commitment to daily physical activity for a minimum of 30 minutes discussed and encouraged, as a part of hypertension management. The importance of attaining a healthy weight is also discussed.  BP/Weight 12/10/2014 10/28/2014 10/27/2014 07/17/2014 06/26/2014 123456 123456  Systolic BP XX123456 Q000111Q XX123456 0000000 123456 0000000 [id number]  Diastolic BP 72 61 78 72 74 78 68  Wt. (Lbs) 120.4 121 121 116 110.8 115.12 115.12  BMI 22.76 22.87 22.87 21.21 20.6 21.4 21.4    CMP Latest Ref Rng 10/28/2014 10/27/2014 07/15/2014  Glucose 70 - 99 mg/dL 55(L) 128(H) 255(H)  BUN 6 - 23 mg/dL 20 20 23   Creatinine 0.50 - 1.10 mg/dL 1.17(H) 0.98 0.98  Sodium 135 - 145 mmol/L 140 137 139  Potassium 3.5 - 5.1 mmol/L 4.3 4.9 5.1  Chloride 96 - 112 mmol/L 104 101 100  CO2 19 - 32 mmol/L 27 29 32  Calcium 8.4 - 10.5 mg/dL 9.8 9.7 10.3  Total Protein 6.0 - 8.3 g/dL - 6.4 6.9  Total Bilirubin 0.2 - 1.2 mg/dL - 0.4 0.6  Alkaline Phos 39 - 117 U/L - 83 97  AST 0 - 37 U/L - 22 18  ALT 0 - 35 U/L - 16 16

## 2014-12-12 NOTE — Progress Notes (Signed)
Subjective:    Patient ID: Emily Jennings, female    DOB: Jun 23, 1934, 81 y.o.   MRN: [id number]  HPI Pt called in last week requesting that I again resume responsibility for her diabetes which is markedly uncontrolled. She is here for one further attempt, if this fails , she states she will see  Endo in [place]. Willing to return to local diabetic educator , and she is in need of a lot of hand holding and re education at this time C/o blood sugars up and down, with no understanding as to why, she believes she eats as she should most of the time Does have dry mouth  And fatigue Log is brought in where she tests 3 times daily, but both pt and myself are able to really track the numbers she has recorded, she is leaving with a log sheet which she is to return with. Increased anxiety continues, now due to her health as well as illness and recent death of close young family members Exercise and diet commitment are both compromised at thsi time    Review of Systems See HPI Denies recent fever or chills.c/o fatigue Denies sinus pressure,  Has had increased nasal congestion, good response to flonase, denies  ear pain or sore throat. Denies chest congestion, productive cough or wheezing. Denies chest pains, palpitations and leg swelling Denies abdominal pain, nausea, vomiting,diarrhea or constipation.   Denies dysuria, frequency, hesitancy or incontinence. C/o shoulder pain and limitation in mobility. Denies headaches, seizures, numbness, or tingling. Denies depression, does have  anxiety and mild  insomnia. Denies skin break down or rash.        Objective:   Physical Exam BP 136/72 mmHg  Pulse 82  Resp 16  Ht 5\' 1"  (1.549 m)  Wt 120 lb 6.4 oz (54.613 kg)  BMI 22.76 kg/m2  SpO2 98% Patient alert and oriented and in no cardiopulmonary distress.  HEENT: No facial asymmetry, EOMI,   oropharynx pink and moist.  Neck supple no JVD, no mass.  Chest: Clear to auscultation  bilaterally.  CVS: S1, S2 no murmurs, no S3.Regular rate.  ABD: Soft non tender.   Ext: No edema  MS: Adequate ROM spine,s, hips and knees.Slightly reduced in shoulders  Skin: Intact, no ulcerations or rash noted.  Psych: Good eye contact, normal affect. Memory mildly impaired,  anxious not  depressed appearing.  CNS: CN 2-12 intact, power,  normal throughout.no focal deficits noted.        Assessment & Plan:  Diabetes mellitus, insulin dependent (IDDM), uncontrolled Uncontrolled, pt in today stating that she will elect to se an endo in [place], rather than be followed in [place]. She clearly understands the need to follow specifically testing , recording, a carb counted diet and commit to regular exercise. He needs to and will return to local diabetiuc educator. Very low threshold for referral to endo , her understanding of her disease and ability to follow through is severely limited Counseled by myself as well as the nurse for 20 minutes  Patient educated about the importance of limiting  Carbohydrate intake , the need to commit to daily physical activity for a minimum of 30 minutes , and to commit weight loss. The fact that changes in all these areas will reduce or eliminate all together the development of diabetes is stressed.   Diabetic Labs Latest Ref Rng 10/28/2014 10/27/2014 07/15/2014 03/23/2014 03/19/2014  HbA1c <5.7 % - 9.8(H) 10.9(H) - 10.5(H)  Microalbumin 0.00 - 1.89 mg/dL - - -  68.65(H) -  Micro/Creat Ratio 0.0 - 30.0 mg/g - - - 597.0(H) -  Chol 0 - 200 mg/dL - - - - 173  HDL >39 mg/dL - - - - 83  Calc LDL 0 - 99 mg/dL - - - - 70  Triglycerides <150 mg/dL - - - - 101  Creatinine 0.50 - 1.10 mg/dL 1.17(H) 0.98 0.98 - 1.26(H)   BP/Weight 12/10/2014 10/28/2014 10/27/2014 07/17/2014 06/26/2014 123456 123456  Systolic BP XX123456 Q000111Q XX123456 0000000 123456 0000000 [id number]  Diastolic BP 72 61 78 72 74 78 68  Wt. (Lbs) 120.4 121 121 116 110.8 115.12 115.12  BMI 22.76 22.87 22.87 21.21  20.6 21.4 21.4   Foot/eye exam completion dates 03/19/2014 01/27/2013  Foot Form Completion Done Done        Essential hypertension Controlled, no change in medication DASH diet and commitment to daily physical activity for a minimum of 30 minutes discussed and encouraged, as a part of hypertension management. The importance of attaining a healthy weight is also discussed.  BP/Weight 12/10/2014 10/28/2014 10/27/2014 07/17/2014 06/26/2014 123456 123456  Systolic BP XX123456 Q000111Q XX123456 0000000 123456 0000000 [id number]  Diastolic BP 72 61 78 72 74 78 68  Wt. (Lbs) 120.4 121 121 116 110.8 115.12 115.12  BMI 22.76 22.87 22.87 21.21 20.6 21.4 21.4    CMP Latest Ref Rng 10/28/2014 10/27/2014 07/15/2014  Glucose 70 - 99 mg/dL 55(L) 128(H) 255(H)  BUN 6 - 23 mg/dL 20 20 23   Creatinine 0.50 - 1.10 mg/dL 1.17(H) 0.98 0.98  Sodium 135 - 145 mmol/L 140 137 139  Potassium 3.5 - 5.1 mmol/L 4.3 4.9 5.1  Chloride 96 - 112 mmol/L 104 101 100  CO2 19 - 32 mmol/L 27 29 32  Calcium 8.4 - 10.5 mg/dL 9.8 9.7 10.3  Total Protein 6.0 - 8.3 g/dL - 6.4 6.9  Total Bilirubin 0.2 - 1.2 mg/dL - 0.4 0.6  Alkaline Phos 39 - 117 U/L - 83 97  AST 0 - 37 U/L - 22 18  ALT 0 - 35 U/L - 16 16        Pain in joint, shoulder region Tramadol to be sent ni for as needed use, 20 tabs prescribed, max of 1 daily   GERD (gastroesophageal reflux disease) Controlled, no change in medication    Allergic rhinitis Increased symptoms with season change , daily flonase started

## 2014-12-14 ENCOUNTER — Other Ambulatory Visit: Payer: Self-pay

## 2014-12-14 ENCOUNTER — Ambulatory Visit: Payer: Self-pay | Admitting: *Deleted

## 2014-12-14 DIAGNOSIS — M25519 Pain in unspecified shoulder: Secondary | ICD-10-CM

## 2014-12-14 MED ORDER — TRAMADOL HCL 50 MG PO TABS: ORAL_TABLET | ORAL | Status: DC

## 2014-12-14 NOTE — Telephone Encounter (Signed)
Called patient and left message for them to return call at the office   

## 2014-12-15 ENCOUNTER — Other Ambulatory Visit: Payer: Self-pay | Admitting: Family Medicine

## 2014-12-15 NOTE — Telephone Encounter (Signed)
Increase to 25 units pls

## 2014-12-15 NOTE — Telephone Encounter (Signed)
[place] said her sugar got up to 403 yesterday evening because she spilt some cake with her hubby for anniversary. Yest am it was 96 and then it went up to 236. Wants to know if ok go ahead and go up to 25 units?

## 2014-12-17 NOTE — Telephone Encounter (Signed)
Pt aware.

## 2014-12-22 ENCOUNTER — Ambulatory Visit (INDEPENDENT_AMBULATORY_CARE_PROVIDER_SITE_OTHER): Payer: Medicare Other | Admitting: Family Medicine

## 2014-12-22 ENCOUNTER — Encounter: Payer: Self-pay | Admitting: Family Medicine

## 2014-12-22 VITALS — BP 140/74 | HR 78 | Resp 16 | Ht 61.0 in | Wt 121.1 lb

## 2014-12-22 DIAGNOSIS — IMO0001 Reserved for inherently not codable concepts without codable children: Secondary | ICD-10-CM

## 2014-12-22 DIAGNOSIS — I10 Essential (primary) hypertension: Secondary | ICD-10-CM

## 2014-12-22 DIAGNOSIS — E1065 Type 1 diabetes mellitus with hyperglycemia: Secondary | ICD-10-CM

## 2014-12-22 DIAGNOSIS — E1165 Type 2 diabetes mellitus with hyperglycemia: Principal | ICD-10-CM

## 2014-12-22 DIAGNOSIS — Z794 Long term (current) use of insulin: Principal | ICD-10-CM

## 2014-12-22 LAB — GLUCOSE, POCT (MANUAL RESULT ENTRY): POC Glucose: 203 mg/dl — AB (ref 70–99)

## 2014-12-22 NOTE — Patient Instructions (Signed)
F/u in 2 weeks with log sheet  You are improving, below are YOUR blood sugar goals, based on the log sheet you have brought in today  Blood sugar goal before breakfast is 90 to 140  Before lunch goal range of 150 to 200  Bedtime blood sugar range is 150 to 230, check bedtime blood sugar 2 hours AFTER your last meal/snack  Write them all down, make a note if you have a bedtime snack  (need this if your bedtime sugar is less than 150, write down your snack also)

## 2014-12-22 NOTE — Assessment & Plan Note (Signed)
Uncontrolled , but improving , will return in 2 weeks, 15 mins spent in direct pt ed She has an appt scheduled with nutritionist in the near future

## 2014-12-23 ENCOUNTER — Ambulatory Visit: Payer: Medicare Other | Admitting: Family Medicine

## 2014-12-24 ENCOUNTER — Ambulatory Visit: Payer: Medicare Other | Admitting: Family Medicine

## 2014-12-27 NOTE — Assessment & Plan Note (Signed)
Elevated at thsi visit, pt ancxious and reports not taking med at her usual tiime today Nop med change DASH diet and commitment to daily physical activity for a minimum of 30 minutes discussed and encouraged, as a part of hypertension management. The importance of attaining a healthy weight is also discussed.  BP/Weight 12/22/2014 12/10/2014 10/28/2014 10/27/2014 07/17/2014 06/26/2014 123456  Systolic BP XX123456 XX123456 Q000111Q XX123456 0000000 123456 0000000  Diastolic BP 74 72 61 78 72 74 78  Wt. (Lbs) 121.12 120.4 121 121 116 110.8 115.12  BMI 22.9 22.76 22.87 22.87 21.21 20.6 21.4

## 2014-12-27 NOTE — Progress Notes (Signed)
   Subjective:    Patient ID: Emily Jennings, female    DOB: 1934/06/11, 81 y.o.   MRN: [id number]  HPI Pt in primarily for f/u of uncontrolled IDDM  Denies polyuria, polydipsia, blurred vision , or hypoglycemic episodes.  Blod sugar log for past 2 weeks shows some improvement , however, fasting sugars are generally between 140 to 180 and bedtime between 200 to 250.  Med adjustments made, pt education done , she will return in 2 weeks and knows to call with concerns, Plan is that if progress made I will assume her care again, states she will go to endo in [place] if needed, but states will not return to local endo    Review of Systems See HPI     Objective:   Physical Exam BP 140/74 mmHg  Pulse 78  Resp 16  Ht 5\' 1"  (1.549 m)  Wt 121 lb 1.9 oz (54.94 kg)  BMI 22.90 kg/m2  SpO2 97% Patient alert and oriented and in no cardiopulmonary distress.  HEENT: No facial asymmetry, EOMI,   oropharynx pink and moist.  Neck supple no JVD, no mass.  Chest: Clear to auscultation bilaterally.  CVS: S1, S2 no murmurs, no S3.Regular rate.  ABD: Soft non tender.   Ext: No edema  .        Assessment & Plan:  Diabetes mellitus, insulin dependent (IDDM), uncontrolled Uncontrolled , but improving , will return in 2 weeks, 15 mins spent in direct pt ed She has an appt scheduled with nutritionist in the near future   Essential hypertension Elevated at thsi visit, pt ancxious and reports not taking med at her usual tiime today Nop med change DASH diet and commitment to daily physical activity for a minimum of 30 minutes discussed and encouraged, as a part of hypertension management. The importance of attaining a healthy weight is also discussed.  BP/Weight 12/22/2014 12/10/2014 10/28/2014 10/27/2014 07/17/2014 06/26/2014 123456  Systolic BP XX123456 XX123456 Q000111Q XX123456 0000000 123456 0000000  Diastolic BP 74 72 61 78 72 74 78  Wt. (Lbs) 121.12 120.4 121 121 116 110.8 115.12  BMI 22.9 22.76 22.87 22.87 21.21  20.6 21.4

## 2014-12-28 ENCOUNTER — Other Ambulatory Visit: Payer: Self-pay | Admitting: Family Medicine

## 2015-01-06 ENCOUNTER — Encounter: Payer: Self-pay | Admitting: Nutrition

## 2015-01-06 ENCOUNTER — Encounter: Payer: Medicare Other | Attending: Family Medicine | Admitting: Nutrition

## 2015-01-06 ENCOUNTER — Ambulatory Visit: Payer: Medicare Other | Admitting: Family Medicine

## 2015-01-06 VITALS — Ht 61.0 in | Wt 121.0 lb

## 2015-01-06 DIAGNOSIS — IMO0002 Reserved for concepts with insufficient information to code with codable children: Secondary | ICD-10-CM

## 2015-01-06 DIAGNOSIS — E118 Type 2 diabetes mellitus with unspecified complications: Secondary | ICD-10-CM | POA: Insufficient documentation

## 2015-01-06 DIAGNOSIS — E1165 Type 2 diabetes mellitus with hyperglycemia: Secondary | ICD-10-CM

## 2015-01-06 DIAGNOSIS — Z713 Dietary counseling and surveillance: Secondary | ICD-10-CM | POA: Insufficient documentation

## 2015-01-06 DIAGNOSIS — Z794 Long term (current) use of insulin: Secondary | ICD-10-CM | POA: Insufficient documentation

## 2015-01-06 NOTE — Progress Notes (Signed)
  Medical Nutrition Therapy:  Appt start time: 1330 end time:  1430.  Assessment:  Primary concerns today: DIabetes. Most recent A1C 9.8% at Dr. [facility].. She does the cooking and shopping.  Eats three meals per day. Her husband is here with her today. She likes to snack between meals at times and eat popcorn after supper. Doesn't drink much water. Not exercising much. Takes her Lantus in the morning and gives it  in the stomach and sometimes in the legs. Has had a low blood sugar in the past. Didn't bring meter or BS log. Tests three times per day;  BS  200's in the am and near 150's before dinner.  Diet is inconsistent in CHO at times at meals and snacks. Not enough low carb vegetables and fresh fruits. Willing to change eating habits to improve blood sugars.       Lantus 35 units.     Glipizide 10 mg daily       Preferred Learning Style:   Auditory  Visual  Hands on  Learning Readiness:  Contemplating  Ready  Change in progress   MEDICATIONS:  See list DIETARY INTAKE:   24-hr recall:  B ( AM): 1 boiled egg, 1/2 c cherrios, milk 4 oz Snk ( AM): none but sometimes cheese/crackers L ( PM):Tuna, cabbage, rice 3/4 c and 2 ritz crackers, water Snk ( PM): peanuts or seeds D ( PM):  Ribs, collard greens, carrots, muffins, toss salad, 1/4 c fruit, water Snk ( PM): popcorn, Beverages: water  Usual physical activity: walks some.  Estimated energy needs: 1500 calories 170 g carbohydrates 112 g protein 42 g fat  Progress Towards Goal(s):  In progress.   Nutritional Diagnosis:  NB-1.1 Food and nutrition-related knowledge deficit As related to Diabetes.  As evidenced by A1C 9.8%..    Intervention:  Nutrition counseling and diabetes education provided on disease, meal planning, low fat low sodium high fiber diet, target ranges for blood sugars, MY Plate, CHO Counting, portion sizes,  Need to avoid snacks, benefits of exercise, treatment and symptoms of hyper/hypoglcymia  and complications of DM.  Goals:  1. Eat three meals per day. 2. Increase water to 4 bottles of water /8 cups per day 3. Cut out snacks between meals 4.  Take Lantus at 9 pm instead of in the morning 5. Walk up and down driveway 5 times daily. 6. Get A1C doen to 7% in three months.  Teaching Method Utilized:  Visual Auditory Hands on  Handouts given during visit include:  The Plate Method  Meal Plan Card  Barriers to learning/adherence to lifestyle change: NOne  Demonstrated degree of understanding via:  Teach Back   Monitoring/Evaluation:  Dietary intake, exercise, meal planning, SBG, and body weight in 1 month(s).  >>>>Recommend to consider stopping the Glipizide since she is on Lantus and consider a trail of adding extended release Metformin and making sure she takes it AFTER breakfast instead of on a empty stomach.>>>>

## 2015-01-06 NOTE — Patient Instructions (Addendum)
Goals:  1. Eat three meals per day. 2. Increase water to 4 bottles of water /8 cups per day 3. Cut out snacks between meals 4.  Take Lantus at 9 pm instead of in the morning 5. Walk up and down driveway 5 times daily. 6. Get A1C doen to 7% in three months.

## 2015-01-07 ENCOUNTER — Ambulatory Visit (INDEPENDENT_AMBULATORY_CARE_PROVIDER_SITE_OTHER): Payer: Medicare Other | Admitting: Family Medicine

## 2015-01-07 ENCOUNTER — Encounter: Payer: Self-pay | Admitting: Family Medicine

## 2015-01-07 VITALS — BP 140/70 | HR 76 | Resp 16 | Ht 61.0 in | Wt 120.0 lb

## 2015-01-07 DIAGNOSIS — I10 Essential (primary) hypertension: Secondary | ICD-10-CM

## 2015-01-07 DIAGNOSIS — Z794 Long term (current) use of insulin: Principal | ICD-10-CM

## 2015-01-07 DIAGNOSIS — IMO0001 Reserved for inherently not codable concepts without codable children: Secondary | ICD-10-CM

## 2015-01-07 DIAGNOSIS — E1065 Type 1 diabetes mellitus with hyperglycemia: Secondary | ICD-10-CM | POA: Diagnosis not present

## 2015-01-07 DIAGNOSIS — E1165 Type 2 diabetes mellitus with hyperglycemia: Principal | ICD-10-CM

## 2015-01-07 LAB — GLUCOSE, POCT (MANUAL RESULT ENTRY): POC Glucose: 266 mg/dl — AB (ref 70–99)

## 2015-01-07 NOTE — Patient Instructions (Addendum)
F/u in May 12 or after call if you need me before  No changes in medication  Take lantus (insulin) at 9 pm every night  It is important that you exercise regularly at least 30 minutes 7 times a week. If you develop chest pain, have severe difficulty breathing, or feel very tired, stop exercising immediately and seek medical attention   Fasting lipid cmp and EGFr and HBA1B May

## 2015-01-07 NOTE — Progress Notes (Signed)
   Subjective:    Patient ID: Emily Jennings, female    DOB: 11/30/1933, 81 y.o.   MRN: [id number]  HPI The PT is here for follow up and re-evaluation of Uncontrolled diabetes. She has her log sheet which still shows more variation in blood sugars than desired , but marked improvement Saw diabetic ediucator yesterday and feels more empowered to take control of her disease She has faithfully tested twice daily and has her log Denies polyuria, polydipsia, blurred vision , or hypoglycemic episodes.      Review of Systems See HPI Denies recent fever or chills. Denies sinus pressure, nasal congestion, ear pain or sore throat. Denies chest congestion, productive cough or wheezing. Denies chest pains, palpitations and leg swelling Denies abdominal pain, nausea, vomiting,diarrhea or constipation.   Denies skin break down or rash. Still very stressed as young family member remains missing        Objective:   Physical Exam BP 140/70 mmHg  Pulse 76  Resp 16  Ht 5\' 1"  (1.549 m)  Wt 120 lb (54.432 kg)  BMI 22.69 kg/m2  SpO2 96% Patient alert and oriented and in no cardiopulmonary distress.  HEENT: No facial asymmetry, EOMI,   oropharynx pink and moist.  Neck supple no JVD, no mass.  Chest: Clear to auscultation bilaterally.  CVS: S1, S2 no murmurs, no S3.Regular rate.  ABD: Soft non tender.   Ext: No edema          Assessment & Plan:  Diabetes mellitus, insulin dependent (IDDM), uncontrolled Improved readings,  Fasting sugars generally less than 170 and bedtime sugars under 250 on average. Still having marked fluctuations, however, saw diabetic educator yesterday and seems to have come away with a lot of knowledge that she intends to apply Will change lantus to bedtime , she was taking at different times During the day Lab due 5/6 she will return after this, no med change Commitment to daily exercise is also discussed   Essential hypertension Elevated at not at  goal.no med change DASH diet and commitment to daily physical activity for a minimum of 30 minutes discussed and encouraged, as a part of hypertension management. The importance of attaining a healthy weight is also discussed.  BP/Weight 01/07/2015 01/06/2015 12/22/2014 12/10/2014 10/28/2014 10/27/2014 [id number]  Systolic BP XX123456 - XX123456 XX123456 Q000111Q [id number] 0000000  Diastolic BP 70 - 74 72 61 78 72  Wt. (Lbs) 120 121 121.12 120.4 121 121 116  BMI 22.69 22.87 22.9 22.76 22.87 22.87 21.21

## 2015-01-10 NOTE — Assessment & Plan Note (Signed)
Elevated at not at goal.no med change DASH diet and commitment to daily physical activity for a minimum of 30 minutes discussed and encouraged, as a part of hypertension management. The importance of attaining a healthy weight is also discussed.  BP/Weight 01/07/2015 01/06/2015 12/22/2014 12/10/2014 10/28/2014 10/27/2014 [id number]  Systolic BP XX123456 - XX123456 XX123456 Q000111Q XX123456 0000000  Diastolic BP 70 - 74 72 61 78 72  Wt. (Lbs) 120 121 121.12 120.4 121 121 116  BMI 22.69 22.87 22.9 22.76 22.87 22.87 21.21

## 2015-01-10 NOTE — Assessment & Plan Note (Signed)
Improved readings,  Fasting sugars generally less than 170 and bedtime sugars under 250 on average. Still having marked fluctuations, however, saw diabetic educator yesterday and seems to have come away with a lot of knowledge that she intends to apply Will change lantus to bedtime , she was taking at different times During the day Lab due 5/6 she will return after this, no med change Commitment to daily exercise is also discussed

## 2015-01-11 ENCOUNTER — Encounter: Payer: Self-pay | Admitting: *Deleted

## 2015-01-11 NOTE — Patient Outreach (Signed)
[place]) Care Management  I have been unable to maintain contact with Emily Jennings over the last 2 months, after greater than 3 separate phone contact attempts. I will discharge Emily Jennings from [facility] services. We will be happy to engage with Emily Jennings at any time in the future should she be interested and in need of case management services.   [facility]  [phone]

## 2015-01-12 NOTE — Patient Outreach (Signed)
[place] [facility]) Care Management  01/12/2015  EULALAH ATTEBERY November 16, 1933 [id number]   Received notification from Janalyn Shy, RN to close case due to unable to maintain contact.  Ronnell Freshwater. [place] CM Assistant Phone: [phone] Fax: [phone]

## 2015-01-14 ENCOUNTER — Telehealth: Payer: Self-pay

## 2015-01-14 NOTE — Telephone Encounter (Signed)
She states taking her insulin at 9 at night is making her sugar drop too low in the am. For the past few days her sugar has been in the low 50's and high 40's. Wants to go back to taking it at 9-10 in the morning. Wants to know if that is ok with you and if she needs to stay on the same dose (25 units)

## 2015-01-14 NOTE — Telephone Encounter (Signed)
Definitely take it consistently at the same time every day is what is most important, if morning works better for her then take it then Dose should stay the same based on numbers I saw last week, needs to eat consistently also

## 2015-01-14 NOTE — Telephone Encounter (Signed)
Patient aware.

## 2015-01-19 ENCOUNTER — Other Ambulatory Visit: Payer: Self-pay | Admitting: Family Medicine

## 2015-01-19 ENCOUNTER — Telehealth: Payer: Self-pay | Admitting: *Deleted

## 2015-01-19 NOTE — Telephone Encounter (Signed)
Noted and med refilled

## 2015-01-19 NOTE — Telephone Encounter (Signed)
Pt called stating she needs test strips. Please advise

## 2015-02-02 ENCOUNTER — Ambulatory Visit (INDEPENDENT_AMBULATORY_CARE_PROVIDER_SITE_OTHER): Payer: Medicare Other | Admitting: Family Medicine

## 2015-02-02 VITALS — BP 138/70 | HR 66 | Resp 18 | Ht 61.0 in | Wt 121.1 lb

## 2015-02-02 DIAGNOSIS — Z794 Long term (current) use of insulin: Secondary | ICD-10-CM

## 2015-02-02 DIAGNOSIS — W57XXXA Bitten or stung by nonvenomous insect and other nonvenomous arthropods, initial encounter: Secondary | ICD-10-CM

## 2015-02-02 DIAGNOSIS — E1065 Type 1 diabetes mellitus with hyperglycemia: Secondary | ICD-10-CM | POA: Diagnosis not present

## 2015-02-02 DIAGNOSIS — E1165 Type 2 diabetes mellitus with hyperglycemia: Secondary | ICD-10-CM

## 2015-02-02 DIAGNOSIS — J3089 Other allergic rhinitis: Secondary | ICD-10-CM

## 2015-02-02 DIAGNOSIS — K219 Gastro-esophageal reflux disease without esophagitis: Secondary | ICD-10-CM

## 2015-02-02 DIAGNOSIS — E1121 Type 2 diabetes mellitus with diabetic nephropathy: Secondary | ICD-10-CM | POA: Diagnosis not present

## 2015-02-02 DIAGNOSIS — T148 Other injury of unspecified body region: Secondary | ICD-10-CM | POA: Diagnosis not present

## 2015-02-02 DIAGNOSIS — IMO0001 Reserved for inherently not codable concepts without codable children: Secondary | ICD-10-CM

## 2015-02-02 MED ORDER — DOXYCYCLINE HYCLATE 100 MG PO TABS: 100.0000 mg | ORAL_TABLET | Freq: Two times a day (BID) | ORAL | Status: DC

## 2015-02-02 NOTE — Patient Instructions (Addendum)
Annual physical exam in 3 month, call if you need me before  Labs needed tomorrow fasting please  CONTINUE weekly calls to office with blood sugar values, if needed , I will see you sooner than 3 months  Remember to eat consistently in terms of times, and amount of carbohydrates in the meal  Thanks for choosing [facility], we consider it a privelige to serve you.   Medication sent for 5 days due to tick bites

## 2015-02-03 DIAGNOSIS — E109 Type 1 diabetes mellitus without complications: Secondary | ICD-10-CM | POA: Diagnosis not present

## 2015-02-03 DIAGNOSIS — E1065 Type 1 diabetes mellitus with hyperglycemia: Secondary | ICD-10-CM | POA: Diagnosis not present

## 2015-02-04 LAB — COMPLETE METABOLIC PANEL WITH GFR
ALK PHOS: 112 U/L (ref 39–117)
ALT: 18 U/L (ref 0–35)
AST: 22 U/L (ref 0–37)
Albumin: 4.1 g/dL (ref 3.5–5.2)
BUN: 26 mg/dL — ABNORMAL HIGH (ref 6–23)
CHLORIDE: 98 meq/L (ref 96–112)
CO2: 27 mEq/L (ref 19–32)
Calcium: 9.3 mg/dL (ref 8.4–10.5)
Creat: 1.08 mg/dL (ref 0.50–1.10)
GFR, Est African American: 57 mL/min — ABNORMAL LOW
GFR, Est Non African American: 49 mL/min — ABNORMAL LOW
Glucose, Bld: 330 mg/dL — ABNORMAL HIGH (ref 70–99)
Potassium: 4.8 mEq/L (ref 3.5–5.3)
Sodium: 134 mEq/L — ABNORMAL LOW (ref 135–145)
TOTAL PROTEIN: 6.5 g/dL (ref 6.0–8.3)
Total Bilirubin: 0.4 mg/dL (ref 0.2–1.2)

## 2015-02-04 LAB — LIPID PANEL
CHOL/HDL RATIO: 1.9 ratio
CHOLESTEROL: 166 mg/dL (ref 0–200)
HDL: 89 mg/dL (ref >=46)
LDL Cholesterol: 68 mg/dL (ref 0–99)
Triglycerides: 44 mg/dL (ref <150)
VLDL: 9 mg/dL (ref 0–40)

## 2015-02-04 LAB — HEMOGLOBIN A1C
Hgb A1c MFr Bld: 9.8 % — ABNORMAL HIGH (ref <5.7)
MEAN PLASMA GLUCOSE: 235 mg/dL — AB (ref <117)

## 2015-02-05 ENCOUNTER — Ambulatory Visit: Payer: Medicare Other | Admitting: Nutrition

## 2015-02-19 ENCOUNTER — Telehealth: Payer: Self-pay

## 2015-02-19 ENCOUNTER — Encounter: Payer: Self-pay | Admitting: Family Medicine

## 2015-02-19 DIAGNOSIS — E1165 Type 2 diabetes mellitus with hyperglycemia: Principal | ICD-10-CM

## 2015-02-19 DIAGNOSIS — IMO0001 Reserved for inherently not codable concepts without codable children: Secondary | ICD-10-CM

## 2015-02-19 DIAGNOSIS — Z794 Long term (current) use of insulin: Principal | ICD-10-CM

## 2015-02-19 NOTE — Telephone Encounter (Signed)
noted 

## 2015-02-19 NOTE — Telephone Encounter (Signed)
Blood sugar reading for this week are as follows  Fasting: 199, [phone]  Referral entered

## 2015-02-22 ENCOUNTER — Ambulatory Visit: Payer: Medicare Other | Admitting: Nutrition

## 2015-02-28 IMAGING — US US EXTREM LOW*L* LIMITED
1 series · 13 of 18 positions shown · non-contrast
Comparison: None

CLINICAL DATA: Mass in LEFT groin for 2 days, tenderness, no
nausea, vomiting or fever, not felt represent a hernia on physical
exam ; no known recent LEFT inguinal procedures

EXAM:
ULTRASOUND LEFT LOWER EXTREMITY LIMITED
TECHNIQUE: Ultrasound examination of the lower extremity soft tissues was
performed in the area of clinical concern at the LEFT groin.

[Series 1: us extrem low*left* limited · 0.06mm/px · 13 of 18 slices shown]
[im 1/18]
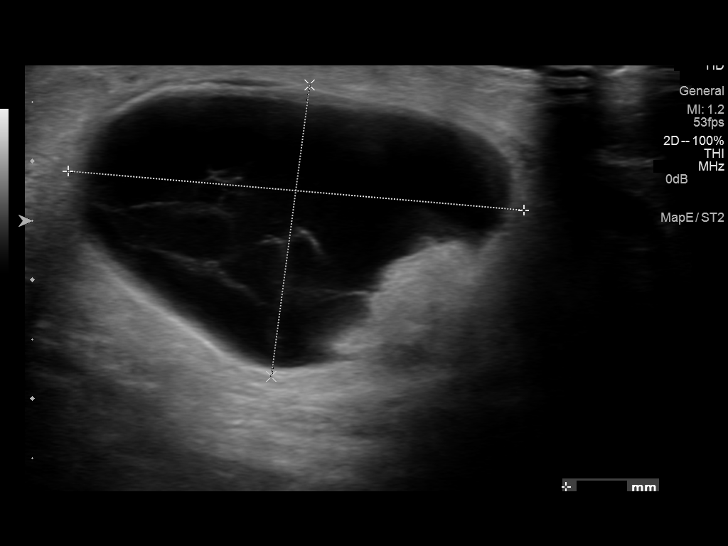
[im 3/18]
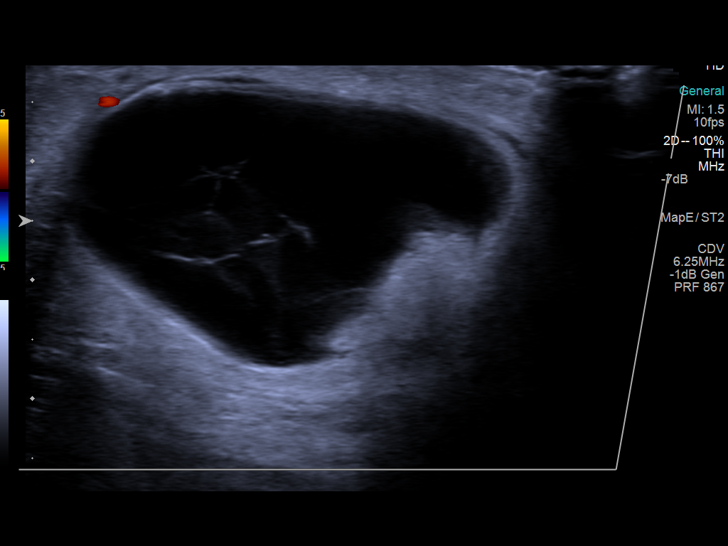
[im 4/18]
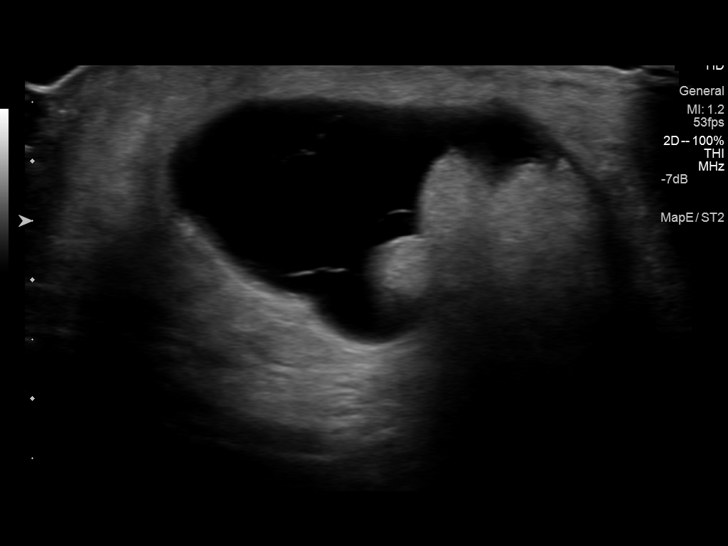
[im 5/18]
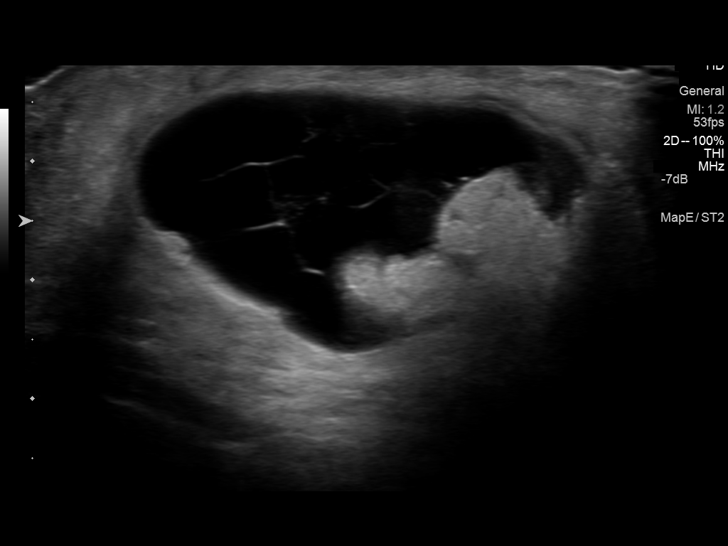
[im 7/18]
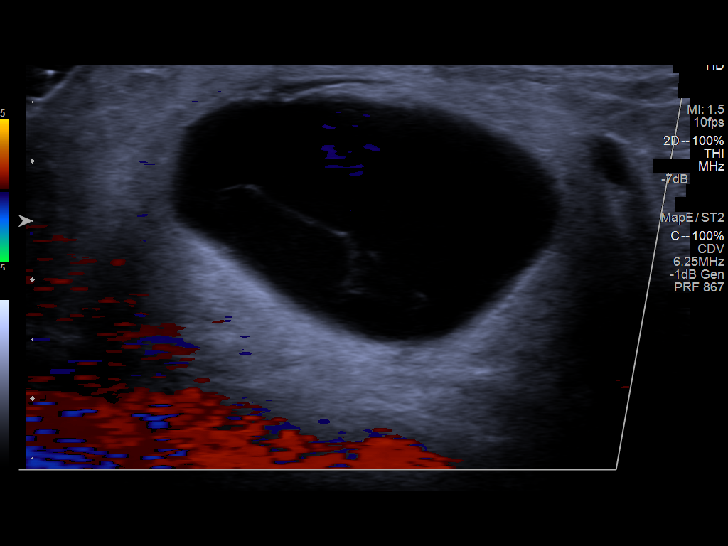
[im 8/18]
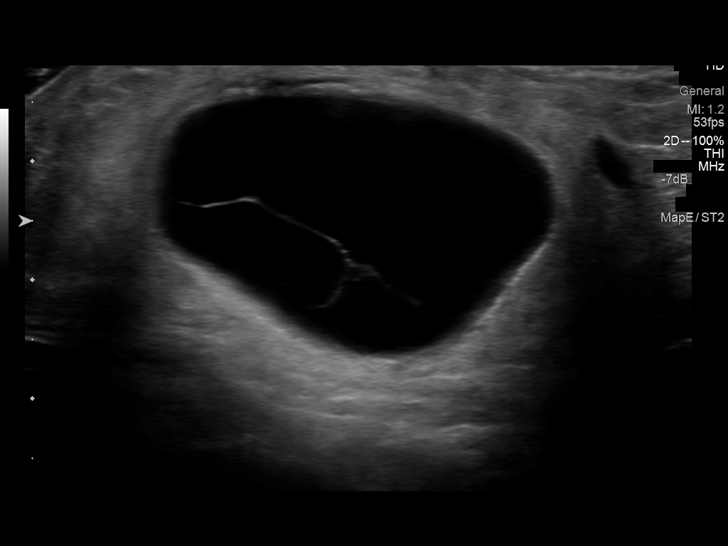
[im 10/18]
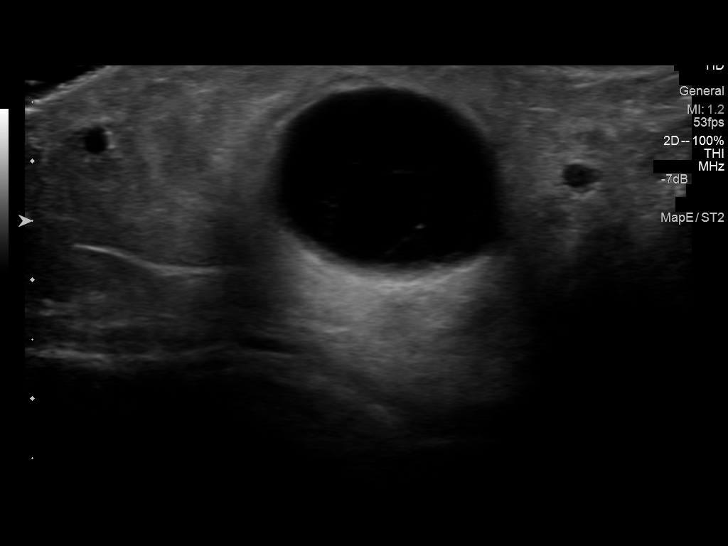
[im 11/18]
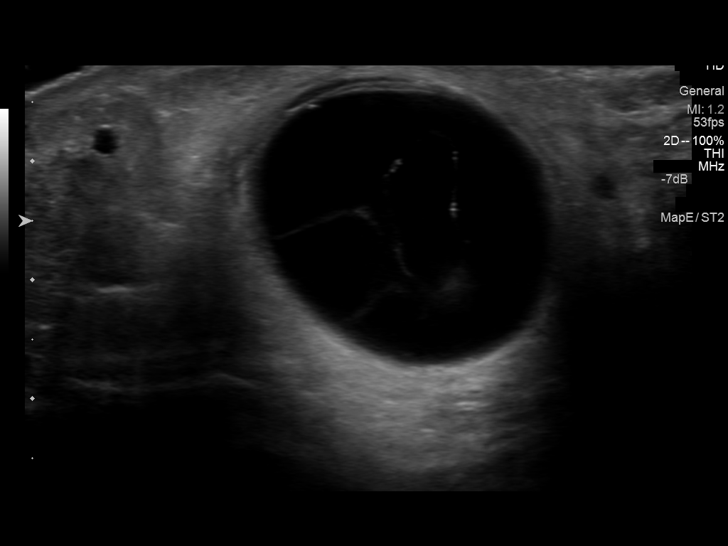
[im 12/18]
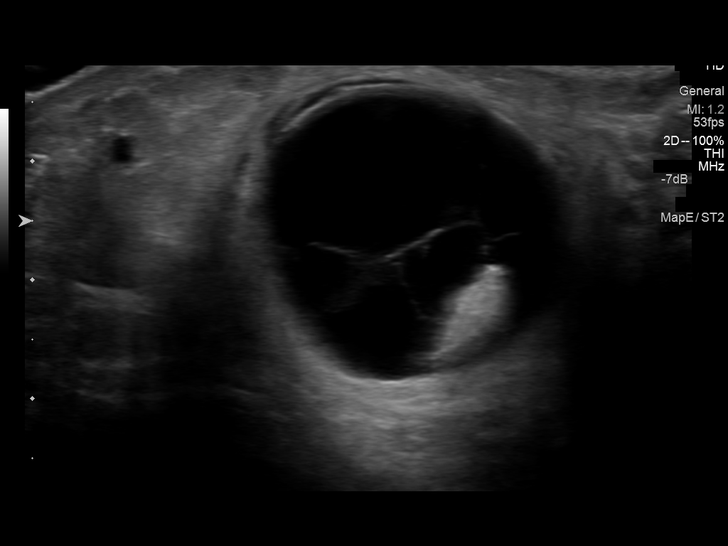
[im 14/18]
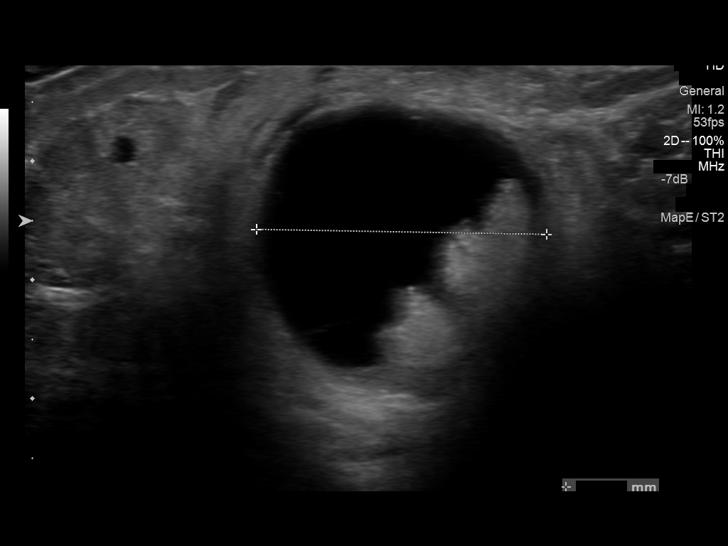
[im 15/18]
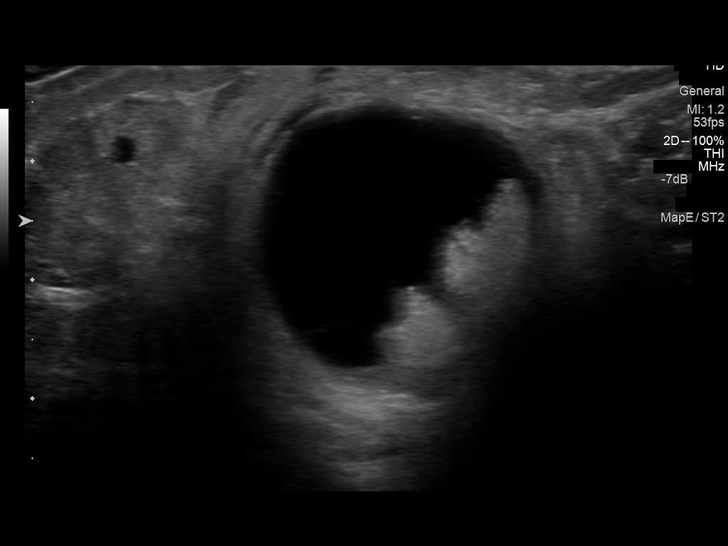
[im 16/18]
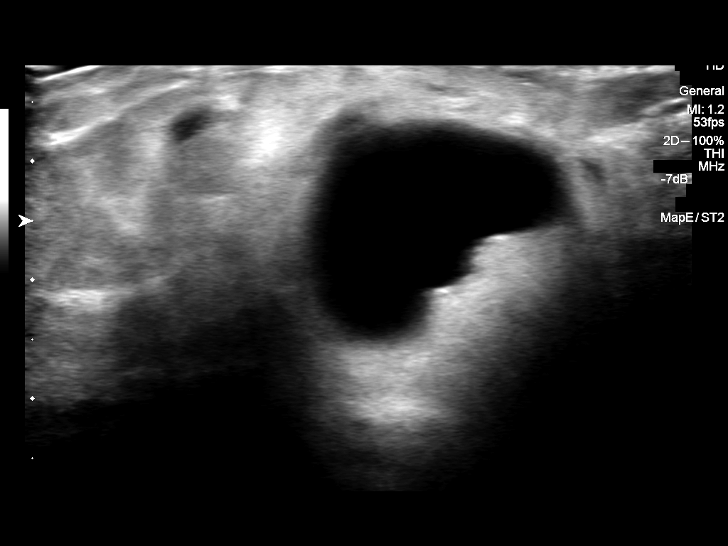
[im 18/18]
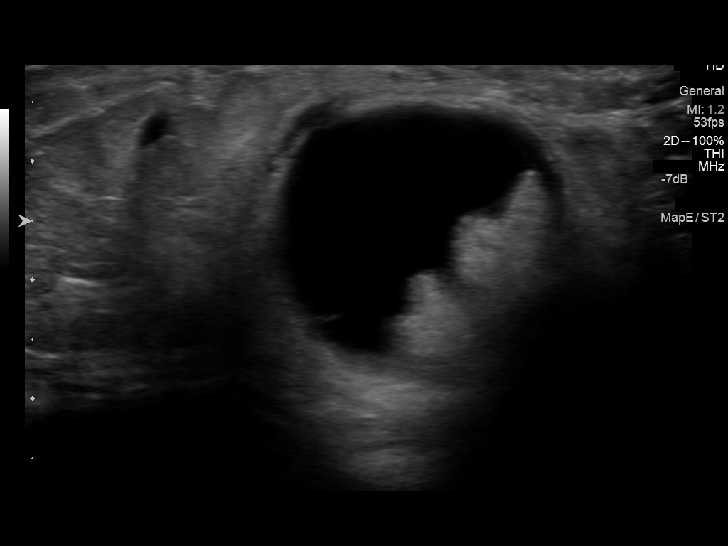

[13 of 18 positions shown; findings below may reference images not displayed]

FINDINGS: Complex subcutaneous lesion identified measuring 3.9 x 2.4 x 2.5 cm.

Lesion is predominantly cystic with scattered thin septations.

Large mural nodule inferiorly 18 x 11 x 9 mm.

No definite blood flow within the mural nodule or wall on color
Doppler imaging.

Lesion appears isolated to the subcutaneous soft tissues without
definite deep extension.

No calcification within lesion or surrounding hypervascularity.
IMPRESSION: Predominately cystic subcutaneous mass with a large intracystic
mural nodule in the LEFT inguinal region at the site of palpable
concern, overall measuring 3.9 x 2.4 x 2.5 cm.

Differential diagnosis includes old hematoma, nonspecific complex
cystic mass, less likely lymphangioma due to presence of mural
nodule, not felt to represent a complicated lymph node.

Consider MR imaging with and without contrast or excision.

Findings discussed with Dr. Geio at time of interpretation 1310
hr on 06/26/2014.

## 2015-03-13 ENCOUNTER — Other Ambulatory Visit: Payer: Self-pay | Admitting: Family Medicine

## 2015-03-29 ENCOUNTER — Encounter: Payer: Self-pay | Admitting: Nutrition

## 2015-03-29 ENCOUNTER — Encounter: Payer: Medicare Other | Attending: "Endocrinology | Admitting: Nutrition

## 2015-03-29 VITALS — Ht 61.0 in | Wt 124.0 lb

## 2015-03-29 DIAGNOSIS — E119 Type 2 diabetes mellitus without complications: Secondary | ICD-10-CM | POA: Insufficient documentation

## 2015-03-29 DIAGNOSIS — Z6823 Body mass index (BMI) 23.0-23.9, adult: Secondary | ICD-10-CM | POA: Insufficient documentation

## 2015-03-29 DIAGNOSIS — Z713 Dietary counseling and surveillance: Secondary | ICD-10-CM | POA: Insufficient documentation

## 2015-03-29 DIAGNOSIS — E785 Hyperlipidemia, unspecified: Secondary | ICD-10-CM | POA: Diagnosis not present

## 2015-03-29 DIAGNOSIS — E1165 Type 2 diabetes mellitus with hyperglycemia: Secondary | ICD-10-CM

## 2015-03-29 DIAGNOSIS — E1122 Type 2 diabetes mellitus with diabetic chronic kidney disease: Secondary | ICD-10-CM | POA: Diagnosis not present

## 2015-03-29 DIAGNOSIS — I10 Essential (primary) hypertension: Secondary | ICD-10-CM | POA: Diagnosis not present

## 2015-03-29 DIAGNOSIS — IMO0002 Reserved for concepts with insufficient information to code with codable children: Secondary | ICD-10-CM

## 2015-03-29 DIAGNOSIS — E118 Type 2 diabetes mellitus with unspecified complications: Secondary | ICD-10-CM

## 2015-03-29 NOTE — Progress Notes (Signed)
  Medical Nutrition Therapy:  Appt start time: 1330 end time:  1400.  Assessment:  Primary concerns today: DIabetes. Most recent A1C 9.8%  She failed to follow up from last visit. Here to see Dr. Dorris Fetch. She is going to work in cutting out snacks as recommended last time. 20 Lantus at night. Usually giving it in abdomen and occassional in the legs. No meter and BS readings. Has been told to rest 4 times per day for 1 week. Husband in the waiting area today. BS was very good today less than 100 before breakfast and before lunch today. BS was 250's last night and she was afraid it was going to drop and so she eats a snack before bed. Reeducated on target ranges for blood sugars. She may have some memory issues. Diet still low in fresh fruits and vegetables and whole grains. Needs to cut out snacks between meals and drink more water. No current A1C .      Lantus 35 units.     Glipizide 10 mg daily       Preferred Learning Style:   Auditory  Visual  Hands on  Learning Readiness:  Contemplating  Ready  Change in progress  MEDICATIONS:  See list DIETARY INTAKE:   24-hr recall:  B ( AM): BS  87  1 cup oatmeal and boiled egg and coffee  water Snk ( AM):  L ( PM):chicken salad sandwich and cereal bar, water Snk ( PM):  D ( PM):  Chicken salad sandwich  Snk ( PM): popcorn, Beverages: water  Usual physical activity: walks some.  Estimated energy needs: 1500 calories 170 g carbohydrates 112 g protein 42 g fat  Progress Towards Goal(s):  In progress.   Nutritional Diagnosis:  NB-1.1 Food and nutrition-related knowledge deficit As related to Diabetes.  As evidenced by A1C 9.8%..    Intervention:  Nutrition counseling and diabetes education provided on disease, meal planning, low fat low sodium high fiber diet, target ranges for blood sugars, MY Plate, CHO Counting, portion sizes,  Need to avoid snacks, benefits of exercise, treatment and symptoms of hyper/hypoglcymia and  complications of DM.  Reviewed meal planning again and avoiding snacks between meals and safe target ranges for blood sugars. Goal A1C 7A%.   Goals:  1. Eat three meals per day. 2. Increase water to 4 bottles of water /8 cups per day 3. Cut out snacks between meals Increase vegetables and fresh fruits daily. 4.  Take Lantus at 9 pm instead of in the morning 5. Walk up and down driveway 5 times daily. 6. Get A1C down to 7% in three months.  Stop Glipizide as recommended by Dr. Dorris Fetch today.  Teaching Method Utilized:  Visual Auditory Hands on  Handouts given during visit include:  The Plate Method  Meal Plan Card  Barriers to learning/adherence to lifestyle change: None  Demonstrated degree of understanding via:  Teach Back   Monitoring/Evaluation:  Dietary intake, exercise, meal planning, SBG, and body weight in 1 week at Dr. Liliane Channel visit.(s).

## 2015-03-29 NOTE — Patient Instructions (Signed)
Goals:  1. Eat three meals per day. 2. Increase water to 4 bottles of water /8 cups per day 3. Cut out snacks between meals Increase vegetables and fresh fruits daily. 4.  Take Lantus at 9 pm instead of in the morning 5. Walk up and down driveway 5 times daily. 6. Get A1C down to 7% in three months.  Stop Glipizide as recommended by Dr. Dorris Fetch today.

## 2015-04-07 DIAGNOSIS — E785 Hyperlipidemia, unspecified: Secondary | ICD-10-CM | POA: Diagnosis not present

## 2015-04-07 DIAGNOSIS — E1122 Type 2 diabetes mellitus with diabetic chronic kidney disease: Secondary | ICD-10-CM | POA: Diagnosis not present

## 2015-04-07 DIAGNOSIS — I10 Essential (primary) hypertension: Secondary | ICD-10-CM | POA: Diagnosis not present

## 2015-04-11 ENCOUNTER — Encounter: Payer: Self-pay | Admitting: Family Medicine

## 2015-04-11 NOTE — Assessment & Plan Note (Signed)
Multiple bites, doxycycline prescribed, no evidence of retained ticks

## 2015-04-11 NOTE — Assessment & Plan Note (Signed)
Controlled, no change in medication  

## 2015-04-11 NOTE — Assessment & Plan Note (Signed)
Deterioated, will refer to endo if this is actually the case which was confirmed once updated lab available Will continiue to follow until she has established with a new Provider Emily Jennings is reminded of the importance of commitment to daily physical activity for 30 minutes or more, as able and the need to limit carbohydrate intake to 30 to 60 grams per meal to help with blood sugar control.   The need to take medication as prescribed, test blood sugar as directed, and to call between visits if there is a concern that blood sugar is uncontrolled is also discussed.   Emily Jennings is reminded of the importance of daily foot exam, annual eye examination, and good blood sugar, blood pressure and cholesterol control.  Diabetic Labs Latest Ref Rng 02/03/2015 10/28/2014 10/27/2014 07/15/2014 03/23/2014  HbA1c <5.7 % 9.8(H) - 9.8(H) 10.9(H) -  Microalbumin 0.00 - 1.89 mg/dL - - - - 68.65(H)  Micro/Creat Ratio 0.0 - 30.0 mg/g - - - - 597.0(H)  Chol 0 - 200 mg/dL 166 - - - -  HDL >=46 mg/dL 89 - - - -  Calc LDL 0 - 99 mg/dL 68 - - - -  Triglycerides <150 mg/dL 44 - - - -  Creatinine 0.50 - 1.10 mg/dL 1.08 1.17(H) 0.98 0.98 -   BP/Weight 03/29/2015 02/02/2015 01/07/2015 01/06/2015 12/22/2014 12/10/2014 [id number]  Systolic BP - 0000000 XX123456 - XX123456 XX123456 Q000111Q  Diastolic BP - 70 70 - 74 72 61  Wt. (Lbs) 124 121.08 120 121 121.12 120.4 121  BMI 23.44 22.89 22.69 22.87 22.9 22.76 22.87   Foot/eye exam completion dates 03/19/2014 01/27/2013  Foot Form Completion Done Done

## 2015-04-11 NOTE — Assessment & Plan Note (Addendum)
Diagnosed in 2009, treated with 5 years of tamoxifen, no recuurrence

## 2015-04-11 NOTE — Progress Notes (Signed)
Emily Jennings     MRN: [id number]      DOB: 26-Dec-1933   HPI Emily Jennings is here for follow up and re-evaluation of chronic medical conditions, medication management and review of any available recent lab and radiology data.  Preventive health is updated, specifically  Cancer screening and Immunization.   Questions or concerns regarding consultations or procedures which the PT has had in the interim are  addressed. The PT denies any adverse reactions to current medications since the last visit.  States she has had uncontrolled blood sugars as has been eating out a lot C/o multiple recent tick bites which she wants checked  ROS Denies recent fever or chills. Denies sinus pressure, nasal congestion, ear pain or sore throat. Denies chest congestion, productive cough or wheezing. Denies chest pains, palpitations and leg swelling Denies abdominal pain, nausea, vomiting,diarrhea or constipation.   Denies dysuria, frequency, hesitancy or incontinence. Denies joint pain, swelling and limitation in mobility. Denies headaches, seizures, numbness, or tingling. Denies depression, anxiety or insomnia.   PE  BP 138/70 mmHg  Pulse 66  Resp 18  Ht 5\' 1"  (1.549 m)  Wt 121 lb 1.3 oz (54.922 kg)  BMI 22.89 kg/m2  SpO2 98%  Patient alert and oriented and in no cardiopulmonary distress.  HEENT: No facial asymmetry, EOMI,   oropharynx pink and moist.  Neck supple no JVD, no mass.  Chest: Clear to auscultation bilaterally.  CVS: S1, S2 no murmurs, no S3.Regular rate.  ABD: Soft non tender.   Ext: No edema  MS: Adequate ROM spine, shoulders, hips and knees.  Skin: multiple tick bites noted, no superinfection , no retained ticks  Psych: Good eye contact, normal affect. Memory intact not anxious or depressed appearing.  CNS: CN 2-12 intact, power,  normal throughout.no focal deficits noted.   Assessment & Plan   Tick bites Multiple bites, doxycycline prescribed, no evidence of retained  ticks  Diabetes mellitus, insulin dependent (IDDM), uncontrolled Deterioated, will refer to endo if this is actually the case which was confirmed once updated lab available Will continiue to follow until she has established with a new Provider Emily Jennings is reminded of the importance of commitment to daily physical activity for 30 minutes or more, as able and the need to limit carbohydrate intake to 30 to 60 grams per meal to help with blood sugar control.   The need to take medication as prescribed, test blood sugar as directed, and to call between visits if there is a concern that blood sugar is uncontrolled is also discussed.   Emily Jennings is reminded of the importance of daily foot exam, annual eye examination, and good blood sugar, blood pressure and cholesterol control.  Diabetic Labs Latest Ref Rng 02/03/2015 10/28/2014 10/27/2014 07/15/2014 03/23/2014  HbA1c <5.7 % 9.8(H) - 9.8(H) 10.9(H) -  Microalbumin 0.00 - 1.89 mg/dL - - - - 68.65(H)  Micro/Creat Ratio 0.0 - 30.0 mg/g - - - - 597.0(H)  Chol 0 - 200 mg/dL 166 - - - -  HDL >=46 mg/dL 89 - - - -  Calc LDL 0 - 99 mg/dL 68 - - - -  Triglycerides <150 mg/dL 44 - - - -  Creatinine 0.50 - 1.10 mg/dL 1.08 1.17(H) 0.98 0.98 -   BP/Weight 03/29/2015 02/02/2015 01/07/2015 01/06/2015 12/22/2014 12/10/2014 XX123456  Systolic BP - 0000000 XX123456 - XX123456 XX123456 Q000111Q  Diastolic BP - 70 70 - 74 72 61  Wt. (Lbs) 124 121.08 120 121 121.12 120.4  121  BMI 23.44 22.89 22.69 22.87 22.9 22.76 22.87   Foot/eye exam completion dates 03/19/2014 01/27/2013  Foot Form Completion Done Done         Diabetic hypertension-nephrosis syndrome Controlled, no change in medication DASH diet and commitment to daily physical activity for a minimum of 30 minutes discussed and encouraged, as a part of hypertension management. The importance of attaining a healthy weight is also discussed.  BP/Weight 03/29/2015 02/02/2015 01/07/2015 01/06/2015 12/22/2014 12/10/2014 XX123456  Systolic BP - 0000000  XX123456 - XX123456 XX123456 Q000111Q  Diastolic BP - 70 70 - 74 72 61  Wt. (Lbs) 124 121.08 120 121 121.12 120.4 121  BMI 23.44 22.89 22.69 22.87 22.9 22.76 22.87        Carcinoma in situ of breast Diagnosed in 2009, treated with 5 years of tamoxifen, no recuurrence  GERD (gastroesophageal reflux disease) Controlled, no change in medication   Allergic rhinitis Controlled, no change in medication

## 2015-04-11 NOTE — Assessment & Plan Note (Signed)
Controlled, no change in medication DASH diet and commitment to daily physical activity for a minimum of 30 minutes discussed and encouraged, as a part of hypertension management. The importance of attaining a healthy weight is also discussed.  BP/Weight 03/29/2015 02/02/2015 01/07/2015 01/06/2015 12/22/2014 12/10/2014 XX123456  Systolic BP - 0000000 XX123456 - XX123456 XX123456 Q000111Q  Diastolic BP - 70 70 - 74 72 61  Wt. (Lbs) 124 121.08 120 121 121.12 120.4 121  BMI 23.44 22.89 22.69 22.87 22.9 22.76 22.87

## 2015-04-19 ENCOUNTER — Telehealth: Payer: Self-pay | Admitting: *Deleted

## 2015-04-19 ENCOUNTER — Other Ambulatory Visit: Payer: Self-pay | Admitting: Family Medicine

## 2015-04-19 NOTE — Telephone Encounter (Signed)
Med refilled.

## 2015-04-19 NOTE — Telephone Encounter (Signed)
Pt called requesting test strips to [facility] in [place] pt said Dr. Dorris Fetch has her checking her sugar 4 times a day, pt also said her pharmacy is faxing over a request. Please advise

## 2015-05-12 ENCOUNTER — Ambulatory Visit: Payer: Self-pay | Admitting: Family Medicine

## 2015-05-12 ENCOUNTER — Encounter: Payer: Self-pay | Admitting: *Deleted

## 2015-05-13 ENCOUNTER — Ambulatory Visit: Payer: Self-pay | Admitting: Nutrition

## 2015-05-19 ENCOUNTER — Other Ambulatory Visit: Payer: Self-pay | Admitting: Family Medicine

## 2015-05-20 ENCOUNTER — Ambulatory Visit: Payer: Self-pay | Admitting: Family Medicine

## 2015-06-02 DIAGNOSIS — E782 Mixed hyperlipidemia: Secondary | ICD-10-CM | POA: Diagnosis not present

## 2015-06-02 DIAGNOSIS — I10 Essential (primary) hypertension: Secondary | ICD-10-CM | POA: Diagnosis not present

## 2015-06-02 DIAGNOSIS — E1122 Type 2 diabetes mellitus with diabetic chronic kidney disease: Secondary | ICD-10-CM | POA: Diagnosis not present

## 2015-06-02 DIAGNOSIS — E559 Vitamin D deficiency, unspecified: Secondary | ICD-10-CM | POA: Diagnosis not present

## 2015-06-02 LAB — HEMOGLOBIN A1C: HEMOGLOBIN A1C: 9.2 % — AB (ref 4.0–6.0)

## 2015-06-04 ENCOUNTER — Ambulatory Visit: Payer: Self-pay | Admitting: Nutrition

## 2015-06-04 ENCOUNTER — Other Ambulatory Visit: Payer: Self-pay | Admitting: Family Medicine

## 2015-06-04 DIAGNOSIS — E1122 Type 2 diabetes mellitus with diabetic chronic kidney disease: Secondary | ICD-10-CM | POA: Diagnosis not present

## 2015-06-04 DIAGNOSIS — I10 Essential (primary) hypertension: Secondary | ICD-10-CM | POA: Diagnosis not present

## 2015-06-04 DIAGNOSIS — E785 Hyperlipidemia, unspecified: Secondary | ICD-10-CM | POA: Diagnosis not present

## 2015-06-05 ENCOUNTER — Other Ambulatory Visit: Payer: Self-pay | Admitting: Family Medicine

## 2015-06-07 ENCOUNTER — Telehealth: Payer: Self-pay | Admitting: *Deleted

## 2015-06-07 NOTE — Telephone Encounter (Signed)
Pt called [facility] stating she needs her test strips called in she is out and she needs to check her blood sugar. Number for strips [id number] per patient

## 2015-06-07 NOTE — Telephone Encounter (Signed)
Message sent to the pharmacy to contact Dr nida

## 2015-06-09 ENCOUNTER — Other Ambulatory Visit: Payer: Self-pay | Admitting: Family Medicine

## 2015-06-10 ENCOUNTER — Encounter: Payer: Self-pay | Admitting: "Endocrinology

## 2015-06-10 DIAGNOSIS — H40013 Open angle with borderline findings, low risk, bilateral: Secondary | ICD-10-CM | POA: Diagnosis not present

## 2015-06-10 DIAGNOSIS — H35363 Drusen (degenerative) of macula, bilateral: Secondary | ICD-10-CM | POA: Diagnosis not present

## 2015-06-10 DIAGNOSIS — H04123 Dry eye syndrome of bilateral lacrimal glands: Secondary | ICD-10-CM | POA: Diagnosis not present

## 2015-06-10 DIAGNOSIS — H2511 Age-related nuclear cataract, right eye: Secondary | ICD-10-CM | POA: Diagnosis not present

## 2015-06-15 ENCOUNTER — Other Ambulatory Visit: Payer: Self-pay | Admitting: Family Medicine

## 2015-06-16 ENCOUNTER — Other Ambulatory Visit: Payer: Self-pay | Admitting: "Endocrinology

## 2015-06-16 ENCOUNTER — Other Ambulatory Visit: Payer: Self-pay

## 2015-06-16 MED ORDER — GLUCOSE BLOOD VI STRP: 1.0000 | ORAL_STRIP | Freq: Three times a day (TID) | Status: DC

## 2015-06-18 ENCOUNTER — Other Ambulatory Visit: Payer: Self-pay

## 2015-06-18 MED ORDER — GLUCOSE BLOOD VI STRP: 1.0000 | ORAL_STRIP | Freq: Three times a day (TID) | Status: DC

## 2015-06-21 ENCOUNTER — Ambulatory Visit (INDEPENDENT_AMBULATORY_CARE_PROVIDER_SITE_OTHER): Payer: Medicare Other | Admitting: "Endocrinology

## 2015-06-21 ENCOUNTER — Encounter: Payer: Self-pay | Admitting: "Endocrinology

## 2015-06-21 VITALS — BP 133/72 | HR 65 | Ht 61.0 in | Wt 126.0 lb

## 2015-06-21 DIAGNOSIS — Z794 Long term (current) use of insulin: Secondary | ICD-10-CM

## 2015-06-21 DIAGNOSIS — N183 Chronic kidney disease, stage 3 unspecified: Secondary | ICD-10-CM

## 2015-06-21 DIAGNOSIS — E1122 Type 2 diabetes mellitus with diabetic chronic kidney disease: Secondary | ICD-10-CM | POA: Diagnosis not present

## 2015-06-21 DIAGNOSIS — I10 Essential (primary) hypertension: Secondary | ICD-10-CM

## 2015-06-21 DIAGNOSIS — Z23 Encounter for immunization: Secondary | ICD-10-CM | POA: Diagnosis not present

## 2015-06-21 MED ORDER — INSULIN ASPART PROT & ASPART (70-30 MIX) 100 UNIT/ML ~~LOC~~ SUSP: 15.0000 [IU] | Freq: Two times a day (BID) | SUBCUTANEOUS | Status: DC

## 2015-06-21 NOTE — Progress Notes (Signed)
Subjective:    Patient ID: Emily Jennings, female    DOB: 26-Apr-1934,    Past Medical History  Diagnosis Date  . Anxiety   . Hypertension   . Allergy   . Hyperglycemia   . Low blood potassium   . Diabetes mellitus   . Osteoporosis   . Non-insulin dependent type 2 diabetes mellitus ([place])   . Coronary artery disease     cardiac catheterization on 03/20/2006  LAD mid 40% stenosis, left circumflex mild 40% stenosis, RCA mid-vessel 40% to 50% lesion   EF 60%  . Carotid stenosis     11/16/2005  mild plaque formation and stenosis proximal right ECA  . Ventricular tachycardia, non-sustained (HCC)     developed during stress test 02/08/2006, spontaneously aborted, mild reversible apical defect  . Arthritis   . Cataract   . GERD (gastroesophageal reflux disease)   . Cancer [facility]) 2009    breast, carcinoma in situ left  . Complication of anesthesia   . Hernia, inguinal     left  . Shortness of breath     2D Echocardiogram 01/26/2009   EF of greater than 55%, mild MR, mild TR, normal ventricular function   Past Surgical History  Procedure Laterality Date  . Cyst removed from left foot    . Breast lumpectomy      Left breast 2009  . Colonoscopy    . Cataract extraction w/phaco Left 10/28/2014    Procedure: PHACO EMULSION CATARACT EXTRACTION WITH INTRAOCULAR LENS IMPLANT LEFT EYE (IOC);  Surgeon: Marylynn Pearson, MD;  Location: [facility];  Service: Ophthalmology;  Laterality: Left;   Social History   Social History  . Marital Status: Married    Spouse Name: N/A  . Number of Children: N/A  . Years of Education: na   Occupational History  . retired    Social History Main Topics  . Smoking status: Never Smoker   . Smokeless tobacco: Never Used  . Alcohol Use: No  . Drug Use: No  . Sexual Activity: Not Currently   Other Topics Concern  . None   Social History Narrative   Outpatient Encounter Prescriptions as of 06/21/2015  Medication Sig  . alendronate (FOSAMAX) 70 MG tablet  Take 1 tablet (70 mg total) by mouth every 7 (seven) days. Take with a full glass of water on an empty stomach.  Marland Kitchen amLODipine (NORVASC) 10 MG tablet TAKE 1 TABLET (10 MG TOTAL) BY MOUTH DAILY.  . calcium-vitamin D (OSCAL WITH D) 500-200 MG-UNIT per tablet Take 2 tablets by mouth daily with breakfast.  . Difluprednate 0.05 % EMUL Apply 1 drop to eye. Left eye twice daily  . gabapentin (NEURONTIN) 100 MG capsule Two tabs by mouth at bedtime  . Garlic [id number] MG CAPS Take 2 capsules by mouth daily.  Marland Kitchen KLOR-CON M20 20 MEQ tablet TAKE ONE TABLET BY MOUTH TWICE DAILY  . losartan (COZAAR) 100 MG tablet TAKE 1 TABLET BY MOUTH EVERY DAY  . metoprolol (LOPRESSOR) 50 MG tablet TAKE 1 TABLET BY MOUTH TWICE A DAY  . Multiple Vitamins-Minerals (MULTI COMPLETE PO) Take 1 tablet by mouth daily.    Marland Kitchen spironolactone (ALDACTONE) 25 MG tablet TAKE ONE TABLET BY MOUTH TWICE DAILY  . [DISCONTINUED] glipiZIDE (GLUCOTROL XL) 10 MG 24 hr tablet TAKE 1 TABLET BY MOUTH EVERY DAY WITH BREAKFAST  . albuterol (PROVENTIL HFA;VENTOLIN HFA) 108 (90 BASE) MCG/ACT inhaler Inhale 1-2 puffs into the lungs every 6 (six) hours as needed for  wheezing or shortness of breath. (Patient not taking: Reported on 06/21/2015)  . amLODipine (NORVASC) 10 MG tablet TAKE 1 TABLET BY MOUTH EVERY DAY  . B-D ULTRA-FINE 33 LANCETS MISC TEST TWICE A DAY  . B-D ULTRAFINE III SHORT PEN 31G X 8 MM MISC USE AS DIRECTED  . doxycycline (VIBRA-TABS) 100 MG tablet Take 1 tablet (100 mg total) by mouth 2 (two) times daily. (Patient not taking: Reported on 06/21/2015)  . fluticasone (FLONASE) 50 MCG/ACT nasal spray Place 2 sprays into both nostrils daily. (Patient not taking: Reported on 06/21/2015)  . glucose blood (BAYER CONTOUR NEXT TEST) test strip 1 each by Other route 3 (three) times daily. Use as instructed  . insulin aspart protamine- aspart (NOVOLOG MIX 70/30) (70-30) 100 UNIT/ML injection Inject 0.15 mLs (15 Units total) into the skin 2 (two) times daily with  a meal.  . omeprazole (PRILOSEC) 20 MG capsule TAKE ONE CAPSULE BY MOUTH EVERY DAY FOR ACID (Patient not taking: Reported on 06/21/2015)  . [DISCONTINUED] Insulin Glargine (LANTUS SOLOSTAR) 100 UNIT/ML Solostar Pen INJECT 25  UNITS INTO THE SKIN DAILY (Patient taking differently: 30 Units. INJECT 30  UNITS INTO THE SKIN DAILY)   No facility-administered encounter medications on file as of 06/21/2015.   ALLERGIES: Allergies  Allergen Reactions  . Benadryl [Diphenhydramine Hcl] Hypertension  . Citalopram     Pt unsure  . Metformin And Related Diarrhea  . Tylenol [Acetaminophen] Other (See Comments)    Patient stated tylenol makes her "feel high"   VACCINATION STATUS: Immunization History  Administered Date(s) Administered  . Influenza Split 07/17/2011  . Influenza Whole 06/18/2007, 07/29/2009, 05/16/2013  . Influenza,inj,Quad PF,36+ Mos 05/19/2014  . Pneumococcal Conjugate-13 03/24/2014  . Pneumococcal Polysaccharide-23 05/20/2008  . Td 06/18/2002  . Tdap 07/17/2011    HPI   Emily Jennings is a [age]-year-old female patient with medical history as above. She was diagnosed with type 2 diabetes at approximately age 67 years. She is here to follow-up for uncontrolled type 2 diabetes complicated by CK D. Her recent A1c was high at 9.2%. Emily Jennings remains on Lantus 30 units daily at bedtime. She was advised not to take glipizide however she used some doses which caused significant fluctuation in her blood glucose profile including hypoglycemia into the 50s. She brought her last Lantus pain with her to clinic.  Review of Systems  Constitutional: Positive for fatigue. Negative for unexpected weight change.  HENT: Negative for trouble swallowing and voice change.   Eyes: Negative for visual disturbance.  Respiratory: Negative for cough, shortness of breath and wheezing.   Cardiovascular: Negative for chest pain, palpitations and leg swelling.  Gastrointestinal: Negative for nausea, vomiting and  diarrhea.  Endocrine: Positive for polydipsia and polyuria. Negative for cold intolerance, heat intolerance and polyphagia.  Musculoskeletal: Negative for myalgias and arthralgias.  Skin: Negative for color change, pallor, rash and wound.  Neurological: Negative for seizures and headaches.  Psychiatric/Behavioral: Negative for suicidal ideas and confusion.    Objective:    BP 133/72 mmHg  Pulse 65  Ht 5\' 1"  (1.549 m)  Wt 126 lb (57.153 kg)  BMI 23.82 kg/m2  SpO2 98%  Wt Readings from Last 3 Encounters:  06/21/15 126 lb (57.153 kg)  06/04/15 123 lb (55.792 kg)  03/29/15 124 lb (56.246 kg)    Physical Exam  Constitutional: She is oriented to person, place, and time. She appears well-developed.  HENT:  Head: Normocephalic and atraumatic.  Eyes: EOM are normal.  Neck: Normal range  of motion. Neck supple. No tracheal deviation present. No thyromegaly present.  Cardiovascular: Normal rate and regular rhythm.   Pulmonary/Chest: Effort normal and breath sounds normal.  Abdominal: Soft. Bowel sounds are normal. There is no tenderness. There is no guarding.  Musculoskeletal: Normal range of motion. She exhibits no edema.  Neurological: She is alert and oriented to person, place, and time. She has normal reflexes. No cranial nerve deficit. Coordination normal.  Skin: Skin is warm and dry. No rash noted. No erythema. No pallor.  Psychiatric: She has a normal mood and affect. Judgment normal.    Results for orders placed or performed in visit on 06/10/15  Hemoglobin A1c  Result Value Ref Range   Hgb A1c MFr Bld 9.2 (A) 4.0 - 6.0 %   Complete Blood Count (Most recent): Lab Results  Component Value Date   WBC 5.7 10/28/2014   HGB 12.8 10/28/2014   HCT 38.4 10/28/2014   MCV 90.4 10/28/2014   PLT 262 10/28/2014   Chemistry (most recent): Lab Results  Component Value Date   NA 134* 02/03/2015   K 4.8 02/03/2015   CL 98 02/03/2015   CO2 27 02/03/2015   BUN 26* 02/03/2015    CREATININE 1.08 02/03/2015   Diabetic Labs (most recent): Lab Results  Component Value Date   HGBA1C 9.2* 06/02/2015   HGBA1C 9.8* 02/03/2015   HGBA1C 9.8* 10/27/2014   Lipid profile (most recent): Lab Results  Component Value Date   TRIG 44 02/03/2015   CHOL 166 02/03/2015         Assessment & Plan:   1. Type 2 diabetes mellitus with stage 3 chronic kidney disease, with long-term current use of insulin   Patient came with significantly fluctuating blood glucose profile, and her recent A1c of 9.2 %. Recent labs reviewed. Patient remains at a high risk for more acute and chronic complications of diabetes which include CAD, CVA, CKD, retinopathy, and neuropathy. These are all discussed in detail with the patient. I have re-counseled the patient on diet management and weight loss, by adopting a carbohydrate restricted / protein rich  Diet. Patient is advised to stick to a routine mealtimes to eat 3 meals  a day and avoid unnecessary snacks ( to snack only to correct hypoglycemia). Patient is advised to eliminate simple carbs  from Emily Jennings diet including cakes desserts ice cream soda (  diet and regular) , sweet tea , Candies,  chips and cookies, artificial sweeteners,   and "sugar-free" products .  This will help patient to have stable blood glucose profile and potentially lose weight.  The patient  is  scheduled with Jearld Fenton, RDN, CDE for individualized DM education. I have approached patient to continue on  intensive monitoring of blood glucose and insulin therapy, and patient agrees. She would need basal bolus insulin.  However she cannot follow optimal insulin instructions for safe use. I will proceed to initiate premixed  insulin NovoLog 70/30  15 units units with breakfast and 15 units with supper associated with strict monitoring of blood glucose before meals 3 times a day. -I advised her to discontinue Lantus. - Patient is warned not to take insulin without proper  monitoring per orders. -Adjustment parameters are given for hypo and hyperglycemia in writing. -Patient is encouraged to call clinic for blood glucose levels less than 70 or above 300 mg /dl.  -Due to significant fluctuation in her blood glucose profile, I advised her not to use glipizide. -Target numbers for A1c, LDL,  HDL, Triglycerides, Waist Circumference were discussed in detail.  2) HTN: Controlled. Continue current medications including ACEI.  3)  Weight/Diet: CDE consult, exercise, and carbs information provided.  4) Chronic Care: -Patient is on ACEI  medications and encouraged to continue to follow up with Ophthalmology, Podiatrist at least yearly or according to recommendations, and advised to stay away from smoking. I have recommended yearly flu vaccine and pneumonia vaccination at least every 5 years; moderate intensity exercise for up to 150 minutes weekly; and  sleep for at least 7 hours a day.  Patient to bring meter and  blood glucose logs during Emily Jennings next visit.  Follow up plan: Return in about 4 weeks (around 07/19/2015) for diabetes, high blood pressure, follow up with meter and logs- no labs.  Glade Lloyd, MD Phone: [phone]  Fax: [phone]   06/21/2015, 4:24 PM

## 2015-07-08 ENCOUNTER — Ambulatory Visit: Payer: Self-pay | Admitting: Family Medicine

## 2015-07-08 ENCOUNTER — Other Ambulatory Visit: Payer: Self-pay | Admitting: Family Medicine

## 2015-07-21 ENCOUNTER — Ambulatory Visit (INDEPENDENT_AMBULATORY_CARE_PROVIDER_SITE_OTHER): Payer: Medicare Other | Admitting: "Endocrinology

## 2015-07-21 ENCOUNTER — Encounter: Payer: Self-pay | Admitting: "Endocrinology

## 2015-07-21 VITALS — BP 138/70 | HR 70 | Ht 61.0 in | Wt 130.0 lb

## 2015-07-21 DIAGNOSIS — E1122 Type 2 diabetes mellitus with diabetic chronic kidney disease: Secondary | ICD-10-CM | POA: Diagnosis not present

## 2015-07-21 DIAGNOSIS — Z794 Long term (current) use of insulin: Secondary | ICD-10-CM

## 2015-07-21 DIAGNOSIS — N183 Chronic kidney disease, stage 3 (moderate): Secondary | ICD-10-CM | POA: Diagnosis not present

## 2015-07-21 DIAGNOSIS — I10 Essential (primary) hypertension: Secondary | ICD-10-CM

## 2015-07-21 NOTE — Progress Notes (Signed)
Subjective:    Patient ID: Emily Jennings, female    DOB: [birth year],    Past Medical History  Diagnosis Date  . Anxiety   . Hypertension   . Allergy   . Hyperglycemia   . Low blood potassium   . Diabetes mellitus   . Osteoporosis   . Non-insulin dependent type 2 diabetes mellitus ([place])   . Coronary artery disease     cardiac catheterization on 03/20/2006  LAD mid 40% stenosis, left circumflex mild 40% stenosis, RCA mid-vessel 40% to 50% lesion   EF 60%  . Carotid stenosis     11/16/2005  mild plaque formation and stenosis proximal right ECA  . Ventricular tachycardia, non-sustained (HCC)     developed during stress test 02/08/2006, spontaneously aborted, mild reversible apical defect  . Arthritis   . Cataract   . GERD (gastroesophageal reflux disease)   . Cancer [facility]) 2009    breast, carcinoma in situ left  . Complication of anesthesia   . Hernia, inguinal     left  . Shortness of breath     2D Echocardiogram 01/26/2009   EF of greater than 55%, mild MR, mild TR, normal ventricular function   Past Surgical History  Procedure Laterality Date  . Cyst removed from left foot    . Breast lumpectomy      Left breast 2009  . Colonoscopy    . Cataract extraction w/phaco Left 10/28/2014    Procedure: PHACO EMULSION CATARACT EXTRACTION WITH INTRAOCULAR LENS IMPLANT LEFT EYE (IOC);  Surgeon: Marylynn Pearson, MD;  Location: [place];  Service: Ophthalmology;  Laterality: Left;   Social History   Social History  . Marital Status: Married    Spouse Name: N/A  . Number of Children: N/A  . Years of Education: na   Occupational History  . retired    Social History Main Topics  . Smoking status: Never Smoker   . Smokeless tobacco: Never Used  . Alcohol Use: No  . Drug Use: No  . Sexual Activity: Not Currently   Other Topics Concern  . None   Social History Narrative   Outpatient Encounter Prescriptions as of 07/21/2015  Medication Sig  . amLODipine (NORVASC) 10 MG tablet TAKE  1 TABLET (10 MG TOTAL) BY MOUTH DAILY.  . calcium-vitamin D (OSCAL WITH D) 500-200 MG-UNIT per tablet Take 2 tablets by mouth daily with breakfast.  . doxycycline (VIBRA-TABS) 100 MG tablet Take 1 tablet (100 mg total) by mouth 2 (two) times daily.  . fluticasone (FLONASE) 50 MCG/ACT nasal spray Place 2 sprays into both nostrils daily.  Marland Kitchen gabapentin (NEURONTIN) 100 MG capsule Two tabs by mouth at bedtime  . Garlic [id number] MG CAPS Take 2 capsules by mouth daily.  Marland Kitchen glucose blood (BAYER CONTOUR NEXT TEST) test strip 1 each by Other route 3 (three) times daily. Use as instructed  . insulin aspart protamine- aspart (NOVOLOG MIX 70/30) (70-30) 100 UNIT/ML injection Inject 0.15 mLs (15 Units total) into the skin 2 (two) times daily with a meal.  . KLOR-CON M20 20 MEQ tablet TAKE ONE TABLET BY MOUTH TWICE DAILY  . losartan (COZAAR) 100 MG tablet TAKE 1 TABLET BY MOUTH EVERY DAY  . metoprolol (LOPRESSOR) 50 MG tablet TAKE 1 TABLET BY MOUTH TWICE A DAY  . Multiple Vitamins-Minerals (MULTI COMPLETE PO) Take 1 tablet by mouth daily.    Marland Kitchen omeprazole (PRILOSEC) 20 MG capsule TAKE ONE CAPSULE BY MOUTH EVERY DAY FOR ACID  .  spironolactone (ALDACTONE) 25 MG tablet TAKE ONE TABLET BY MOUTH TWICE DAILY  . albuterol (PROVENTIL HFA;VENTOLIN HFA) 108 (90 BASE) MCG/ACT inhaler Inhale 1-2 puffs into the lungs every 6 (six) hours as needed for wheezing or shortness of breath. (Patient not taking: Reported on 06/21/2015)  . alendronate (FOSAMAX) 70 MG tablet TAKE 1 TABLET BY MOUTH EVERY 7 DAYS. TAKE WITH A FULL GLASS OF WATER ON AN EMPTY STOMACH  . amLODipine (NORVASC) 10 MG tablet TAKE 1 TABLET BY MOUTH EVERY DAY  . B-D ULTRA-FINE 33 LANCETS MISC TEST TWICE A DAY  . B-D ULTRAFINE III SHORT PEN 31G X 8 MM MISC USE AS DIRECTED  . Difluprednate 0.05 % EMUL Apply 1 drop to eye. Left eye twice daily   No facility-administered encounter medications on file as of 07/21/2015.   ALLERGIES: Allergies  Allergen Reactions  .  Benadryl [Diphenhydramine Hcl] Hypertension  . Citalopram     Pt unsure  . Metformin And Related Diarrhea  . Tylenol [Acetaminophen] Other (See Comments)    Patient stated tylenol makes her "feel high"   VACCINATION STATUS: Immunization History  Administered Date(s) Administered  . Influenza Split 07/17/2011  . Influenza Whole 06/18/2007, 07/29/2009, 05/16/2013  . Influenza,inj,Quad PF,36+ Mos 05/19/2014  . Pneumococcal Conjugate-13 03/24/2014  . Pneumococcal Polysaccharide-23 05/20/2008  . Td 06/18/2002  . Tdap 07/17/2011    HPI   Emily Jennings is a 81 year old female patient with medical history as above. She was diagnosed with type 2 diabetes at approximately age 51 years. She is here to follow-up for uncontrolled type 2 diabetes complicated by CK D. Her recent A1c was high at 9.2%. She is now on Novolog 70/30 15 units BID. She has better BG readings , some mild hypoglycemia in teh evening hours. She feels better.  She is  advised not to take glipizide however she used some doses which caused significant fluctuation in her blood glucose profile including hypoglycemia into the 50s.   Review of Systems  Constitutional: Negative for fatigue and unexpected weight change.  HENT: Negative for trouble swallowing and voice change.   Eyes: Negative for visual disturbance.  Respiratory: Negative for cough, shortness of breath and wheezing.   Cardiovascular: Negative for chest pain, palpitations and leg swelling.  Gastrointestinal: Negative for nausea, vomiting and diarrhea.  Endocrine: Negative for cold intolerance, heat intolerance, polydipsia, polyphagia and polyuria.  Musculoskeletal: Negative for myalgias and arthralgias.  Skin: Negative for color change, pallor, rash and wound.  Neurological: Negative for seizures and headaches.  Psychiatric/Behavioral: Negative for suicidal ideas and confusion.    Objective:    BP 138/70 mmHg  Pulse 70  Ht 5\' 1"  (1.549 m)  Wt 130 lb (58.968  kg)  BMI 24.58 kg/m2  SpO2 98%  Wt Readings from Last 3 Encounters:  07/21/15 130 lb (58.968 kg)  06/21/15 126 lb (57.153 kg)  06/04/15 123 lb (55.792 kg)    Physical Exam  Constitutional: She is oriented to person, place, and time. She appears well-developed.  HENT:  Head: Normocephalic and atraumatic.  Eyes: EOM are normal.  Neck: Normal range of motion. Neck supple. No tracheal deviation present. No thyromegaly present.  Cardiovascular: Normal rate and regular rhythm.   Pulmonary/Chest: Effort normal and breath sounds normal.  Abdominal: Soft. Bowel sounds are normal. There is no tenderness. There is no guarding.  Musculoskeletal: Normal range of motion. She exhibits no edema.  Neurological: She is alert and oriented to person, place, and time. She has normal reflexes. No cranial  nerve deficit. Coordination normal.  Skin: Skin is warm and dry. No rash noted. No erythema. No pallor.  Psychiatric: She has a normal mood and affect. Judgment normal.    Results for orders placed or performed in visit on 06/10/15  Hemoglobin A1c  Result Value Ref Range   Hgb A1c MFr Bld 9.2 (A) 4.0 - 6.0 %   Complete Blood Count (Most recent): Lab Results  Component Value Date   WBC 5.7 10/28/2014   HGB 12.8 10/28/2014   HCT 38.4 10/28/2014   MCV 90.4 10/28/2014   PLT 262 10/28/2014   Chemistry (most recent): Lab Results  Component Value Date   NA 134* 02/03/2015   K 4.8 02/03/2015   CL 98 02/03/2015   CO2 27 02/03/2015   BUN 26* 02/03/2015   CREATININE 1.08 02/03/2015   Diabetic Labs (most recent): Lab Results  Component Value Date   HGBA1C 9.2* 06/02/2015   HGBA1C 9.8* 02/03/2015   HGBA1C 9.8* 10/27/2014   Lipid profile (most recent): Lab Results  Component Value Date   TRIG 44 02/03/2015   CHOL 166 02/03/2015      Assessment & Plan:   1. Type 2 diabetes mellitus with stage 3 chronic kidney disease, with long-term current use of insulin   Patient came with  significantly fluctuating blood glucose profile, and her recent A1c of 9.2 %. Recent labs reviewed. Patient remains at a high risk for more acute and chronic complications of diabetes which include CAD, CVA, CKD, retinopathy, and neuropathy. These are all discussed in detail with the patient. I have re-counseled the patient on diet management and weight loss, by adopting a carbohydrate restricted / protein rich  Diet. Patient is advised to stick to a routine mealtimes to eat 3 meals  a day and avoid unnecessary snacks ( to snack only to correct hypoglycemia). Patient is advised to eliminate simple carbs  from their diet including cakes desserts ice cream soda (  diet and regular) , sweet tea , Candies,  chips and cookies, artificial sweeteners,   and "sugar-free" products .  This will help patient to have stable blood glucose profile and potentially lose weight.  The patient  Has been scheduled with Jearld Fenton, RDN, CDE for individualized DM education. I have approached patient to continue on  intensive monitoring of blood glucose and insulin therapy, and patient agrees. She would need basal bolus insulin.  However she cannot follow optimal insulin instructions for safe use. I will proceed with  premixed  insulin NovoLog 70/30  15 units  QAM with breakfast and 10 units with supper when pre-meal glucose is above 90 mg/dL . She is advised to continue  strict monitoring of blood glucose before meals 3 times a day.  - Patient is warned not to take insulin without proper monitoring per orders.  -Patient is encouraged to call clinic for blood glucose levels less than 70 or above 300 mg /dl.  -Target numbers for A1c, LDL, HDL, Triglycerides, Waist Circumference were discussed in detail.  2) HTN: Controlled. Continue current medications including ACEI.  3)  Weight/Diet: CDE consult, exercise, and carbs information provided.  4) Chronic Care: -Patient is on ACEI  medications and encouraged to  continue to follow up with Ophthalmology, Podiatrist at least yearly or according to recommendations, and advised to stay away from smoking. I have recommended yearly flu vaccine and pneumonia vaccination at least every 5 years; moderate intensity exercise for up to 150 minutes weekly; and  sleep for at  least 7 hours a day.  Patient to bring meter and  blood glucose logs during their next visit.  Follow up plan: Return in about 8 weeks (around 09/15/2015) for diabetes, high blood pressure.  Glade Lloyd, MD Phone: [phone]  Fax: [phone]   07/21/2015, 12:03 PM

## 2015-07-22 ENCOUNTER — Other Ambulatory Visit: Payer: Self-pay | Admitting: Family Medicine

## 2015-08-24 ENCOUNTER — Other Ambulatory Visit: Payer: Self-pay | Admitting: Family Medicine

## 2015-08-24 DIAGNOSIS — Z1231 Encounter for screening mammogram for malignant neoplasm of breast: Secondary | ICD-10-CM

## 2015-09-02 ENCOUNTER — Other Ambulatory Visit: Payer: Self-pay | Admitting: Family Medicine

## 2015-09-11 ENCOUNTER — Other Ambulatory Visit: Payer: Self-pay | Admitting: "Endocrinology

## 2015-09-15 ENCOUNTER — Ambulatory Visit (HOSPITAL_COMMUNITY)
Admission: RE | Admit: 2015-09-15 | Discharge: 2015-09-15 | Disposition: A | Discharge status: Home / Self Care | Payer: Medicare Other | Source: Ambulatory Visit | Attending: Family Medicine | Admitting: Family Medicine

## 2015-09-15 ENCOUNTER — Encounter: Payer: Self-pay | Admitting: Family Medicine

## 2015-09-15 ENCOUNTER — Other Ambulatory Visit: Payer: Self-pay | Admitting: Family Medicine

## 2015-09-15 ENCOUNTER — Other Ambulatory Visit: Payer: Self-pay | Admitting: "Endocrinology

## 2015-09-15 ENCOUNTER — Ambulatory Visit (INDEPENDENT_AMBULATORY_CARE_PROVIDER_SITE_OTHER): Payer: Medicare Other | Admitting: Family Medicine

## 2015-09-15 VITALS — BP 138/72 | HR 96 | Resp 18 | Ht 61.0 in | Wt 129.0 lb

## 2015-09-15 DIAGNOSIS — N183 Chronic kidney disease, stage 3 unspecified: Secondary | ICD-10-CM

## 2015-09-15 DIAGNOSIS — Z794 Long term (current) use of insulin: Secondary | ICD-10-CM

## 2015-09-15 DIAGNOSIS — E1122 Type 2 diabetes mellitus with diabetic chronic kidney disease: Secondary | ICD-10-CM | POA: Diagnosis not present

## 2015-09-15 DIAGNOSIS — R079 Chest pain, unspecified: Secondary | ICD-10-CM | POA: Diagnosis not present

## 2015-09-15 DIAGNOSIS — Z0000 Encounter for general adult medical examination without abnormal findings: Secondary | ICD-10-CM

## 2015-09-15 DIAGNOSIS — R0789 Other chest pain: Secondary | ICD-10-CM

## 2015-09-15 DIAGNOSIS — E1129 Type 2 diabetes mellitus with other diabetic kidney complication: Secondary | ICD-10-CM | POA: Diagnosis not present

## 2015-09-15 LAB — BASIC METABOLIC PANEL
BUN: 19 mg/dL (ref 7–25)
CHLORIDE: 102 mmol/L (ref 98–110)
CO2: 26 mmol/L (ref 20–31)
Calcium: 9.5 mg/dL (ref 8.6–10.4)
Creat: 1.16 mg/dL — ABNORMAL HIGH (ref 0.60–0.93)
GLUCOSE: 194 mg/dL — AB (ref 65–99)
POTASSIUM: 4.7 mmol/L (ref 3.5–5.3)
Sodium: 141 mmol/L (ref 135–146)

## 2015-09-15 LAB — HEMOGLOBIN A1C
Hgb A1c MFr Bld: 7.6 % — ABNORMAL HIGH (ref <5.7)
Mean Plasma Glucose: 171 mg/dL — ABNORMAL HIGH (ref <117)

## 2015-09-15 NOTE — Assessment & Plan Note (Signed)
Left lower postrior chest pain x 5 months, pressure worsens, h/o breast caner

## 2015-09-15 NOTE — Assessment & Plan Note (Signed)

## 2015-09-15 NOTE — Patient Instructions (Signed)
Annual exam in 4.5 month, call if  You need me before  Fall Prevention in the Home  Falls can cause injuries. They can happen to people of all ages. There are many things you can do to make your home safe and to help prevent falls.  WHAT CAN I DO ON THE OUTSIDE OF MY HOME?  Regularly fix the edges of walkways and driveways and fix any cracks.  Remove anything that might make you trip as you walk through a door, such as a raised step or threshold.  Trim any bushes or trees on the path to your home.  Use bright outdoor lighting.  Clear any walking paths of anything that might make someone trip, such as rocks or tools.  Regularly check to see if handrails are loose or broken. Make sure that both sides of any steps have handrails.  Any raised decks and porches should have guardrails on the edges.  Have any leaves, snow, or ice cleared regularly.  Use sand or salt on walking paths during winter.  Clean up any spills in your garage right away. This includes oil or grease spills. WHAT CAN I DO IN THE BATHROOM?   Use night lights.  Install grab bars by the toilet and in the tub and shower. Do not use towel bars as grab bars.  Use non-skid mats or decals in the tub or shower.  If you need to sit down in the shower, use a plastic, non-slip stool.  Keep the floor dry. Clean up any water that spills on the floor as soon as it happens.  Remove soap buildup in the tub or shower regularly.  Attach bath mats securely with double-sided non-slip rug tape.  Do not have throw rugs and other things on the floor that can make you trip. WHAT CAN I DO IN THE BEDROOM?  Use night lights.  Make sure that you have a light by your bed that is easy to reach.  Do not use any sheets or blankets that are too big for your bed. They should not hang down onto the floor.  Have a firm chair that has side arms. You can use this for support while you get dressed.  Do not have throw rugs and other things  on the floor that can make you trip. WHAT CAN I DO IN THE KITCHEN?  Clean up any spills right away.  Avoid walking on wet floors.  Keep items that you use a lot in easy-to-reach places.  If you need to reach something above you, use a strong step stool that has a grab bar.  Keep electrical cords out of the way.  Do not use floor polish or wax that makes floors slippery. If you must use wax, use non-skid floor wax.  Do not have throw rugs and other things on the floor that can make you trip. WHAT CAN I DO WITH MY STAIRS?  Do not leave any items on the stairs.  Make sure that there are handrails on both sides of the stairs and use them. Fix handrails that are broken or loose. Make sure that handrails are as long as the stairways.  Check any carpeting to make sure that it is firmly attached to the stairs. Fix any carpet that is loose or worn.  Avoid having throw rugs at the top or bottom of the stairs. If you do have throw rugs, attach them to the floor with carpet tape.  Make sure that you have a light  switch at the top of the stairs and the bottom of the stairs. If you do not have them, ask someone to add them for you. WHAT ELSE CAN I DO TO HELP PREVENT FALLS?  Wear shoes that:  Do not have high heels.  Have rubber bottoms.  Are comfortable and fit you well.  Are closed at the toe. Do not wear sandals.  If you use a stepladder:  Make sure that it is fully opened. Do not climb a closed stepladder.  Make sure that both sides of the stepladder are locked into place.  Ask someone to hold it for you, if possible.  Clearly mark and make sure that you can see:  Any grab bars or handrails.  First and last steps.  Where the edge of each step is.  Use tools that help you move around (mobility aids) if they are needed. These include:  Canes.  Walkers.  Scooters.  Crutches.  Turn on the lights when you go into a dark area. Replace any light bulbs as soon as they  burn out.  Set up your furniture so you have a clear path. Avoid moving your furniture around.  If any of your floors are uneven, fix them.  If there are any pets around you, be aware of where they are.  Review your medicines with your doctor. Some medicines can make you feel dizzy. This can increase your chance of falling. Ask your doctor what other things that you can do to help prevent falls.   This information is not intended to replace advice given to you by your health care provider. Make sure you discuss any questions you have with your health care provider.   Document Released: 07/01/2009 Document Revised: 01/19/2015 Document Reviewed: 10/09/2014 Elsevier Interactive Patient Education 2016 [employer].   Thanks for choosing [facility], we consider it a privelige to serve you.  All the best for 2017!

## 2015-09-15 NOTE — Progress Notes (Signed)
Subjective:    Patient ID: Emily Jennings, female    DOB: [birth year], 81 y.o.   MRN: [id number]  HPI Preventive Screening-Counseling & Management   Patient present here today for a Medicare annual wellness visit.   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 No  Over the past two weeks, have you felt little interest or pleasure in doing things? No  Have you lost interest or pleasure in daily life? No  Do you often feel hopeless? No  Do you cry easily over simple problems? No   Activities of Daily Living  In your present state of health, do you have any difficulty performing the following activities?  Driving?: No Managing money?: No Feeding yourself?:No Getting from bed to chair?:No Climbing a flight of stairs?:No Preparing food and eating?:No Bathing or showering?:No Getting dressed?:No Getting to the toilet?:No Using the toilet?:No Moving around from place to place?: No  Fall Risk Assessment In the past year have you fallen or had a near fall?:No Are you currently taking any medications that make you dizzy?:tramadol which she used in March   Hearing Difficulties: No Do you often ask people to speak up or repeat themselves?:yes at times Do you experience ringing or noises in your ears?:No Do you have difficulty understanding soft or whispered voices?:yes  Cognitive Testing  Alert? Yes Normal Appearance?Yes  Oriented to person? Yes Place? Yes  Time? Yes  Displays appropriate judgment?Yes  Can  read the correct time from a watch face? yes Are you having problems remembering things?No  Advanced Directives have been discussed with the patient?Yes and brochure provided    List the Names of Other Physician/Practitioners you currently use: careteams updated    Indicate any recent Medical Services you may have received from other than [facility] providers in the past year (date may be approximate).   Assessment:    Annual Wellness Exam   Plan:    During the course of the visit the patient was educated and counseled about appropriate screening and preventive services including:  A healthy diet is rich in fruit, vegetables and whole grains. Poultry fish, nuts and beans are a healthy choice for protein rather then red meat. A low sodium diet and drinking 64 ounces of water daily is generally recommended. Oils and sweet should be limited. Carbohydrates especially for those who are diabetic or overweight, should be limited to 30-45 gram per meal. It is important to eat on a regular schedule, at least 3 times daily. Snacks should be primarily fruits, vegetables or nuts. It is important that you exercise regularly at least 30 minutes 5 times a week. If you develop chest pain, have severe difficulty breathing, or feel very tired, stop exercising immediately and seek medical attention  Immunization reviewed and updated. Cancer screening reviewed and updated    Patient Instructions (the written plan) was given to the patient.  Medicare Attestation  I have personally reviewed:  The patient's medical and social history  Their use of alcohol,  tobacco or illicit drugs  Their current medications and supplements  The patient's functional ability including ADLs,fall risks, home safety risks, cognitive, and hearing and visual impairment  Diet and physical activities  Evidence for depression or mood disorders  The patient's weight, height, BMI, and visual acuity have been recorded in the chart. I have  made referrals, counseling, and provided education to the patient based on review of the above and I have provided the patient with a written personalized care plan for preventive services.      Review of Systems     Objective:   Physical Exam BP 138/72 mmHg  Pulse 96  Resp 18  Ht 5\' 1"  (1.549 m)  Wt 129 lb (58.514 kg)  BMI 24.39 kg/m2  SpO2 97%  Chest: clear to ascultation bilaterally. No erythema , warmth or rash present. Tender over inferior aspect of left posterior chest      Assessment & Plan:  Medicare annual wellness visit, subsequent Annual exam as documented. Counseling done  re healthy lifestyle involving commitment to 150 minutes exercise per week, heart healthy diet, and attaining healthy weight.The importance of adequate sleep also discussed. Regular seat belt use and home safety, is also discussed. Changes in health habits are decided on by the patient with goals and time frames  set for achieving them. Immunization and cancer screening needs are specifically addressed at this visit.   Chest pain Left lower postrior chest pain x 5 months, pressure worsens, h/o breast caner

## 2015-09-16 ENCOUNTER — Ambulatory Visit (INDEPENDENT_AMBULATORY_CARE_PROVIDER_SITE_OTHER): Payer: Medicare Other | Admitting: "Endocrinology

## 2015-09-16 ENCOUNTER — Encounter: Payer: Self-pay | Admitting: "Endocrinology

## 2015-09-16 ENCOUNTER — Other Ambulatory Visit (HOSPITAL_COMMUNITY)
Admission: RE | Admit: 2015-09-16 | Discharge: 2015-09-16 | Disposition: A | Discharge status: Home / Self Care | Payer: Medicare Other | Source: Skilled Nursing Facility | Attending: Family Medicine | Admitting: Family Medicine

## 2015-09-16 VITALS — BP 141/78 | HR 96 | Ht 61.0 in | Wt 128.0 lb

## 2015-09-16 DIAGNOSIS — E1122 Type 2 diabetes mellitus with diabetic chronic kidney disease: Secondary | ICD-10-CM | POA: Diagnosis not present

## 2015-09-16 DIAGNOSIS — Z794 Long term (current) use of insulin: Secondary | ICD-10-CM

## 2015-09-16 DIAGNOSIS — I10 Essential (primary) hypertension: Secondary | ICD-10-CM | POA: Diagnosis not present

## 2015-09-16 DIAGNOSIS — N183 Chronic kidney disease, stage 3 unspecified: Secondary | ICD-10-CM

## 2015-09-16 MED ORDER — INSULIN ASPART PROT & ASPART (70-30 MIX) 100 UNIT/ML ~~LOC~~ SUSP: SUBCUTANEOUS | Status: DC

## 2015-09-16 NOTE — Progress Notes (Signed)
Subjective:    Patient ID: Emily Jennings, female    DOB: 1934/07/29,    Past Medical History  Diagnosis Date  . Anxiety   . Hypertension   . Allergy   . Hyperglycemia   . Low blood potassium   . Diabetes mellitus   . Osteoporosis   . Non-insulin dependent type 2 diabetes mellitus ([place])   . Coronary artery disease     cardiac catheterization on 03/20/2006  LAD mid 40% stenosis, left circumflex mild 40% stenosis, RCA mid-vessel 40% to 50% lesion   EF 60%  . Carotid stenosis     11/16/2005  mild plaque formation and stenosis proximal right ECA  . Ventricular tachycardia, non-sustained (HCC)     developed during stress test 02/08/2006, spontaneously aborted, mild reversible apical defect  . Arthritis   . Cataract   . GERD (gastroesophageal reflux disease)   . Cancer [facility]) 2009    breast, carcinoma in situ left  . Complication of anesthesia   . Hernia, inguinal     left  . Shortness of breath     2D Echocardiogram 01/26/2009   EF of greater than 55%, mild MR, mild TR, normal ventricular function   Past Surgical History  Procedure Laterality Date  . Cyst removed from left foot    . Breast lumpectomy      Left breast 2009  . Colonoscopy    . Cataract extraction w/phaco Left 10/28/2014    Procedure: PHACO EMULSION CATARACT EXTRACTION WITH INTRAOCULAR LENS IMPLANT LEFT EYE (IOC);  Surgeon: Marylynn Pearson, MD;  Location: [place];  Service: Ophthalmology;  Laterality: Left;   Social History   Social History  . Marital Status: Married    Spouse Name: N/A  . Number of Children: N/A  . Years of Education: na   Occupational History  . retired    Social History Main Topics  . Smoking status: Never Smoker   . Smokeless tobacco: Never Used  . Alcohol Use: No  . Drug Use: No  . Sexual Activity: Not Currently   Other Topics Concern  . None   Social History Narrative   Outpatient Encounter Prescriptions as of 09/16/2015  Medication Sig  . alendronate (FOSAMAX) 70 MG tablet  TAKE 1 TABLET BY MOUTH EVERY 7 DAYS. TAKE WITH A FULL GLASS OF WATER ON AN EMPTY STOMACH  . amLODipine (NORVASC) 10 MG tablet TAKE 1 TABLET (10 MG TOTAL) BY MOUTH DAILY.  Marland Kitchen B-D ULTRA-FINE 33 LANCETS MISC TEST TWICE A DAY  . B-D ULTRAFINE III SHORT PEN 31G X 8 MM MISC USE AS DIRECTED  . calcium-vitamin D (OSCAL WITH D) 500-200 MG-UNIT per tablet Take 2 tablets by mouth daily with breakfast.  . Difluprednate 0.05 % EMUL Apply 1 drop to eye. Left eye twice daily  . glucose blood (BAYER CONTOUR NEXT TEST) test strip 1 each by Other route 3 (three) times daily. Use as instructed  . insulin aspart protamine- aspart (NOVOLOG MIX 70/30) (70-30) 100 UNIT/ML injection Inject 15 units with breakfast and 10 units with supper when blood glucose is above 90.  Marland Kitchen KLOR-CON M20 20 MEQ tablet TAKE ONE TABLET BY MOUTH TWICE DAILY  . losartan (COZAAR) 100 MG tablet TAKE 1 TABLET BY MOUTH EVERY DAY  . metoprolol (LOPRESSOR) 50 MG tablet TAKE 1 TABLET BY MOUTH TWICE A DAY  . spironolactone (ALDACTONE) 25 MG tablet TAKE ONE TABLET BY MOUTH TWICE DAILY  . vitamin E 400 UNIT capsule Take 400 Units by  mouth daily.  . [DISCONTINUED] NOVOLOG MIX 70/30 (70-30) 100 UNIT/ML injection INJECT 0.15 MLS (15 UNITS TOTAL) INTO THE SKIN 2 (TWO) TIMES DAILY WITH A MEAL.  . Multiple Vitamins-Minerals (MULTI COMPLETE PO) Take 1 tablet by mouth daily. Reported on 09/16/2015   No facility-administered encounter medications on file as of 09/16/2015.   ALLERGIES: Allergies  Allergen Reactions  . Benadryl [Diphenhydramine Hcl] Hypertension  . Citalopram     Pt unsure  . Metformin And Related Diarrhea  . Tramadol Other (See Comments)    Felt light headed and dizzy  . Tylenol [Acetaminophen] Other (See Comments)    Patient stated tylenol makes her "feel high"   VACCINATION STATUS: Immunization History  Administered Date(s) Administered  . Influenza Split 07/17/2011  . Influenza Whole 06/18/2007, 07/29/2009, 05/16/2013  .  Influenza,inj,Quad PF,36+ Mos 05/19/2014  . Pneumococcal Conjugate-13 03/24/2014  . Pneumococcal Polysaccharide-23 05/20/2008  . Td 06/18/2002  . Tdap 07/17/2011    Diabetes She presents for her follow-up diabetic visit. She has type 2 diabetes mellitus. Onset time: She was diagnosed at approximate age of 81 years. Her disease course has been improving. There are no hypoglycemic associated symptoms. Pertinent negatives for hypoglycemia include no confusion, headaches, pallor or seizures. There are no diabetic associated symptoms. Pertinent negatives for diabetes include no chest pain, no fatigue, no polydipsia, no polyphagia and no polyuria. There are no hypoglycemic complications. Symptoms are improving. Diabetic complications include nephropathy. Risk factors for coronary artery disease include diabetes mellitus, dyslipidemia, hypertension and sedentary lifestyle. Current diabetic treatment includes insulin injections. She is compliant with treatment some of the time. Her weight is stable. She is following a generally unhealthy diet. She has had a previous visit with a dietitian. She never participates in exercise. Home blood sugar record trend: She did not bring the meter nor blood glucose profile to review. An ACE inhibitor/angiotensin II receptor blocker is being taken.  Hyperlipidemia This is a chronic problem. The current episode started more than 1 year ago. Pertinent negatives include no chest pain, myalgias or shortness of breath. Risk factors for coronary artery disease include diabetes mellitus, dyslipidemia and a sedentary lifestyle.  Hypertension This is a chronic problem. The current episode started more than 1 year ago. Pertinent negatives include no chest pain, headaches, palpitations or shortness of breath. Risk factors for coronary artery disease include diabetes mellitus, dyslipidemia and sedentary lifestyle. Past treatments include angiotensin blockers.      Review of Systems   Constitutional: Negative for fatigue and unexpected weight change.  HENT: Negative for trouble swallowing and voice change.   Eyes: Negative for visual disturbance.  Respiratory: Negative for cough, shortness of breath and wheezing.   Cardiovascular: Negative for chest pain, palpitations and leg swelling.  Gastrointestinal: Negative for nausea, vomiting and diarrhea.  Endocrine: Negative for cold intolerance, heat intolerance, polydipsia, polyphagia and polyuria.  Musculoskeletal: Negative for myalgias and arthralgias.  Skin: Negative for color change, pallor, rash and wound.  Neurological: Negative for seizures and headaches.  Psychiatric/Behavioral: Negative for suicidal ideas and confusion.    Objective:    BP 141/78 mmHg  Pulse 96  Ht 5\' 1"  (1.549 m)  Wt 128 lb (58.06 kg)  BMI 24.20 kg/m2  SpO2 96%  Wt Readings from Last 3 Encounters:  09/16/15 128 lb (58.06 kg)  09/15/15 129 lb (58.514 kg)  07/21/15 130 lb (58.968 kg)    Physical Exam  Constitutional: She is oriented to person, place, and time. She appears well-developed.  HENT:  Head:  Normocephalic and atraumatic.  Eyes: EOM are normal.  Neck: Normal range of motion. Neck supple. No tracheal deviation present. No thyromegaly present.  Cardiovascular: Normal rate and regular rhythm.   Pulmonary/Chest: Effort normal and breath sounds normal.  Abdominal: Soft. Bowel sounds are normal. There is no tenderness. There is no guarding.  Musculoskeletal: Normal range of motion. She exhibits no edema.  Neurological: She is alert and oriented to person, place, and time. She has normal reflexes. No cranial nerve deficit. Coordination normal.  Skin: Skin is warm and dry. No rash noted. No erythema. No pallor.  Psychiatric: She has a normal mood and affect. Judgment normal.    Results for orders placed or performed in visit on [id number]  Basic metabolic panel  Result Value Ref Range   Sodium 141 135 - 146 mmol/L   Potassium 4.7  3.5 - 5.3 mmol/L   Chloride 102 98 - 110 mmol/L   CO2 26 20 - 31 mmol/L   Glucose, Bld 194 (H) 65 - 99 mg/dL   BUN 19 7 - 25 mg/dL   Creat 1.16 (H) 0.60 - 0.93 mg/dL   Calcium 9.5 8.6 - 10.4 mg/dL  Hemoglobin A1c  Result Value Ref Range   Hgb A1c MFr Bld 7.6 (H) <5.7 %   Mean Plasma Glucose 171 (H) <117 mg/dL   Complete Blood Count (Most recent): Lab Results  Component Value Date   WBC 5.7 10/28/2014   HGB 12.8 10/28/2014   HCT 38.4 10/28/2014   MCV 90.4 10/28/2014   PLT 262 10/28/2014   Chemistry (most recent): Lab Results  Component Value Date   NA 141 09/15/2015   K 4.7 09/15/2015   CL 102 09/15/2015   CO2 26 09/15/2015   BUN 19 09/15/2015   CREATININE 1.16* 09/15/2015   Diabetic Labs (most recent): Lab Results  Component Value Date   HGBA1C 7.6* 09/15/2015   HGBA1C 9.2* 06/02/2015   HGBA1C 9.8* 02/03/2015   Lipid profile (most recent): Lab Results  Component Value Date   TRIG 44 02/03/2015   CHOL 166 02/03/2015      Assessment & Plan:   1. Type 2 diabetes mellitus with stage 3 chronic kidney disease, with long-term current use of insulin   Patient came with significantly fluctuating blood glucose profile, and her recent A1c is improved to 7.6% from 9.2 %. Recent labs reviewed. Patient remains at a high risk for more acute and chronic complications of diabetes which include CAD, CVA, CKD, retinopathy, and neuropathy. These are all discussed in detail with the patient. I have re-counseled the patient on diet management and weight loss, by adopting a carbohydrate restricted / protein rich  Diet. Patient is advised to stick to a routine mealtimes to eat 3 meals  a day and avoid unnecessary snacks ( to snack only to correct hypoglycemia). Patient is advised to eliminate simple carbs  from their diet including cakes desserts ice cream soda (  diet and regular) , sweet tea , Candies,  chips and cookies, artificial sweeteners,   and "sugar-free" products .  This  will help patient to have stable blood glucose profile and potentially lose weight.  The patient  Has been scheduled with Jearld Fenton, RDN, CDE for individualized DM education. I have approached patient to continue on  intensive monitoring of blood glucose and insulin therapy, and patient agrees. She would need basal bolus insulin.  However she cannot follow optimal insulin instructions for safe use. I will continue with  premixed  insulin NovoLog 70/30  15 units  QAM with breakfast and 10 units with supper when pre-meal glucose is above 90 mg/dL . She is advised to continue  strict monitoring of blood glucose before meals 3 times a day.  - Patient is warned not to take insulin without proper monitoring per orders.  -Patient is encouraged to call clinic for blood glucose levels less than 70 or above 300 mg /dl.  -Target numbers for A1c, LDL, HDL, Triglycerides, Waist Circumference were discussed in detail.  2) HTN: Controlled. Continue current medications including ACEI.  3)  Weight/Diet: CDE consult, exercise, and carbs information provided.  4) Chronic Care: -Patient is on ARB  medications and encouraged to continue to follow up with Ophthalmology, Podiatrist at least yearly or according to recommendations, and advised to stay away from smoking. I have recommended yearly flu vaccine and pneumonia vaccination at least every 5 years; moderate intensity exercise for up to 150 minutes weekly; and  sleep for at least 7 hours a day.  Patient to bring meter and  blood glucose logs during their next visit.  Follow up plan: Return in about 3 months (around 12/15/2015) for diabetes, high blood pressure, follow up with pre-visit labs, meter, and logs.  Glade Lloyd, MD Phone: [phone]  Fax: [phone]   09/16/2015, 1:17 PM

## 2015-09-17 ENCOUNTER — Other Ambulatory Visit: Payer: Self-pay

## 2015-09-17 LAB — MICROALBUMIN / CREATININE URINE RATIO
CREATININE, URINE: 28 mg/dL (ref 20–320)
MICROALB UR: 2.2 mg/dL
MICROALB/CREAT RATIO: 79 ug/mg{creat} — AB (ref <30)

## 2015-09-17 MED ORDER — "INSULIN SYRINGE 31G X 5/16"" 0.5 ML MISC": 1.0000 | Freq: Two times a day (BID) | Status: DC

## 2015-10-04 ENCOUNTER — Other Ambulatory Visit: Payer: Self-pay

## 2015-10-04 ENCOUNTER — Ambulatory Visit (HOSPITAL_COMMUNITY)
Admission: RE | Admit: 2015-10-04 | Discharge: 2015-10-04 | Disposition: A | Discharge status: Home / Self Care | Payer: PPO | Source: Ambulatory Visit | Attending: Family Medicine | Admitting: Family Medicine

## 2015-10-04 DIAGNOSIS — Z1231 Encounter for screening mammogram for malignant neoplasm of breast: Secondary | ICD-10-CM | POA: Insufficient documentation

## 2015-10-04 MED ORDER — BLOOD GLUCOSE MONITOR KIT: PACK | Status: DC

## 2015-10-04 MED ORDER — GLUCOSE BLOOD VI STRP: ORAL_STRIP | Status: DC

## 2015-10-26 ENCOUNTER — Telehealth: Payer: Self-pay | Admitting: Family Medicine

## 2015-10-26 NOTE — Telephone Encounter (Signed)
Patient is stating that her new Ins [employer] does not cover her brand of Potassium but I dont see it on her list, please advise?

## 2015-10-31 ENCOUNTER — Other Ambulatory Visit: Payer: Self-pay | Admitting: "Endocrinology

## 2015-11-01 ENCOUNTER — Other Ambulatory Visit: Payer: Self-pay

## 2015-11-01 MED ORDER — POTASSIUM CHLORIDE ER 20 MEQ PO TBCR: EXTENDED_RELEASE_TABLET | ORAL | Status: DC

## 2015-11-01 NOTE — Telephone Encounter (Signed)
Will bring her book of formulary drugs by so it can be changed

## 2015-11-19 ENCOUNTER — Other Ambulatory Visit: Payer: Self-pay | Admitting: Family Medicine

## 2015-11-25 ENCOUNTER — Other Ambulatory Visit: Payer: Self-pay | Admitting: Family Medicine

## 2015-11-29 ENCOUNTER — Other Ambulatory Visit: Payer: Self-pay | Admitting: "Endocrinology

## 2015-12-06 ENCOUNTER — Other Ambulatory Visit: Payer: Self-pay | Admitting: Family Medicine

## 2015-12-16 ENCOUNTER — Other Ambulatory Visit: Payer: Self-pay | Admitting: "Endocrinology

## 2015-12-20 ENCOUNTER — Ambulatory Visit: Payer: Self-pay | Admitting: "Endocrinology

## 2015-12-22 ENCOUNTER — Other Ambulatory Visit: Payer: Self-pay | Admitting: "Endocrinology

## 2015-12-22 DIAGNOSIS — N183 Chronic kidney disease, stage 3 (moderate): Secondary | ICD-10-CM | POA: Diagnosis not present

## 2015-12-22 DIAGNOSIS — Z794 Long term (current) use of insulin: Secondary | ICD-10-CM | POA: Diagnosis not present

## 2015-12-22 DIAGNOSIS — E1122 Type 2 diabetes mellitus with diabetic chronic kidney disease: Secondary | ICD-10-CM | POA: Diagnosis not present

## 2015-12-22 LAB — BASIC METABOLIC PANEL
BUN: 31 mg/dL — ABNORMAL HIGH (ref 7–25)
CALCIUM: 9.7 mg/dL (ref 8.6–10.4)
CO2: 28 mmol/L (ref 20–31)
CREATININE: 1.5 mg/dL — AB (ref 0.60–0.93)
Chloride: 102 mmol/L (ref 98–110)
GLUCOSE: 185 mg/dL — AB (ref 65–99)
Potassium: 4.5 mmol/L (ref 3.5–5.3)
SODIUM: 138 mmol/L (ref 135–146)

## 2015-12-22 LAB — HEMOGLOBIN A1C
HEMOGLOBIN A1C: 7.2 % — AB (ref <5.7)
Mean Plasma Glucose: 160 mg/dL

## 2015-12-23 ENCOUNTER — Encounter: Payer: Self-pay | Admitting: "Endocrinology

## 2015-12-23 ENCOUNTER — Ambulatory Visit (INDEPENDENT_AMBULATORY_CARE_PROVIDER_SITE_OTHER): Payer: Medicare Other | Admitting: "Endocrinology

## 2015-12-23 VITALS — BP 137/71 | HR 84 | Ht 61.0 in | Wt 134.0 lb

## 2015-12-23 DIAGNOSIS — N183 Chronic kidney disease, stage 3 (moderate): Secondary | ICD-10-CM

## 2015-12-23 DIAGNOSIS — Z794 Long term (current) use of insulin: Secondary | ICD-10-CM | POA: Diagnosis not present

## 2015-12-23 DIAGNOSIS — E1122 Type 2 diabetes mellitus with diabetic chronic kidney disease: Secondary | ICD-10-CM | POA: Diagnosis not present

## 2015-12-23 DIAGNOSIS — I10 Essential (primary) hypertension: Secondary | ICD-10-CM

## 2015-12-23 NOTE — Patient Instructions (Signed)

## 2015-12-23 NOTE — Progress Notes (Signed)
Subjective:    Patient ID: Emily Jennings, female    DOB: 1933-11-30,    Past Medical History  Diagnosis Date  . Anxiety   . Hypertension   . Allergy   . Hyperglycemia   . Low blood potassium   . Diabetes mellitus   . Osteoporosis   . Non-insulin dependent type 2 diabetes mellitus ([place])   . Coronary artery disease     cardiac catheterization on 03/20/2006  LAD mid 40% stenosis, left circumflex mild 40% stenosis, RCA mid-vessel 40% to 50% lesion   EF 60%  . Carotid stenosis     11/16/2005  mild plaque formation and stenosis proximal right ECA  . Ventricular tachycardia, non-sustained (HCC)     developed during stress test 02/08/2006, spontaneously aborted, mild reversible apical defect  . Arthritis   . Cataract   . GERD (gastroesophageal reflux disease)   . Cancer [facility]) 2009    breast, carcinoma in situ left  . Complication of anesthesia   . Hernia, inguinal     left  . Shortness of breath     2D Echocardiogram 01/26/2009   EF of greater than 55%, mild MR, mild TR, normal ventricular function   Past Surgical History  Procedure Laterality Date  . Cyst removed from left foot    . Breast lumpectomy      Left breast 2009  . Colonoscopy    . Cataract extraction w/phaco Left 10/28/2014    Procedure: PHACO EMULSION CATARACT EXTRACTION WITH INTRAOCULAR LENS IMPLANT LEFT EYE (IOC);  Surgeon: Marylynn Pearson, MD;  Location: [place];  Service: Ophthalmology;  Laterality: Left;   Social History   Social History  . Marital Status: Married    Spouse Name: N/A  . Number of Children: N/A  . Years of Education: na   Occupational History  . retired    Social History Main Topics  . Smoking status: Never Smoker   . Smokeless tobacco: Never Used  . Alcohol Use: No  . Drug Use: No  . Sexual Activity: Not Currently   Other Topics Concern  . None   Social History Narrative   Outpatient Encounter Prescriptions as of 12/23/2015  Medication Sig  . alendronate (FOSAMAX) 70 MG tablet TAKE  1 TABLET BY MOUTH EVERY 7 DAYS. TAKE WITH A FULL GLASS OF WATER ON AN EMPTY STOMACH  . amLODipine (NORVASC) 10 MG tablet TAKE 1 TABLET BY MOUTH EVERY DAY  . B-D ULTRAFINE III SHORT PEN 31G X 8 MM MISC USE AS DIRECTED  . blood glucose meter kit and supplies KIT One touch Ultra. Use up to two times daily as directed. (FOR ICD E11.65)  . calcium-vitamin D (OSCAL WITH D) 500-200 MG-UNIT per tablet Take 2 tablets by mouth daily with breakfast.  . Difluprednate 0.05 % EMUL Apply 1 drop to eye. Left eye twice daily  . Insulin Syringe-Needle U-100 (INSULIN SYRINGE .5CC/31GX5/16") 31G X 5/16" 0.5 ML MISC 1 each by Does not apply route 2 (two) times daily.  Marland Kitchen losartan (COZAAR) 100 MG tablet TAKE 1 TABLET BY MOUTH EVERY DAY  . metoprolol (LOPRESSOR) 50 MG tablet TAKE 1 TABLET BY MOUTH TWICE A DAY  . Multiple Vitamins-Minerals (MULTI COMPLETE PO) Take 1 tablet by mouth daily. Reported on 09/16/2015  . NOVOLOG MIX 70/30 (70-30) 100 UNIT/ML injection INJECT 15 UNITS WITH BREAKFAST AND 10 UNITS WITH SUPPER WHEN BLOOD GLUCOSE IS ABOVE 90.  Marland Kitchen ONE TOUCH ULTRA TEST test strip USE TO TEST BLOOD SUGAR 2  TIMES A DAY  . ONETOUCH DELICA LANCETS FINE MISC USE TO OBTAIN A BLOOD SPECIMEN 2 TIMES A DAY  . Potassium Chloride ER 20 MEQ TBCR 1 tablet twice daily  . spironolactone (ALDACTONE) 25 MG tablet TAKE ONE TABLET BY MOUTH TWICE DAILY  . vitamin E 400 UNIT capsule Take 400 Units by mouth daily.  . [DISCONTINUED] amLODipine (NORVASC) 10 MG tablet TAKE 1 TABLET (10 MG TOTAL) BY MOUTH DAILY.   No facility-administered encounter medications on file as of 12/23/2015.   ALLERGIES: Allergies  Allergen Reactions  . Benadryl [Diphenhydramine Hcl] Hypertension  . Citalopram     Pt unsure  . Metformin And Related Diarrhea  . Tramadol Other (See Comments)    Felt light headed and dizzy  . Tylenol [Acetaminophen] Other (See Comments)    Patient stated tylenol makes her "feel high"   VACCINATION STATUS: Immunization History   Administered Date(s) Administered  . Influenza Split 07/17/2011  . Influenza Whole 06/18/2007, 07/29/2009, 05/16/2013  . Influenza,inj,Quad PF,36+ Mos 05/19/2014  . Pneumococcal Conjugate-13 03/24/2014  . Pneumococcal Polysaccharide-23 05/20/2008  . Td 06/18/2002  . Tdap 07/17/2011    Diabetes She presents for her follow-up diabetic visit. She has type 2 diabetes mellitus. Onset time: She was diagnosed at approximate age of 74 years. Her disease course has been improving. There are no hypoglycemic associated symptoms. Pertinent negatives for hypoglycemia include no confusion, headaches, pallor or seizures. There are no diabetic associated symptoms. Pertinent negatives for diabetes include no chest pain, no fatigue, no polydipsia, no polyphagia and no polyuria. There are no hypoglycemic complications. Symptoms are improving. Diabetic complications include nephropathy. Risk factors for coronary artery disease include diabetes mellitus, dyslipidemia, hypertension and sedentary lifestyle. Current diabetic treatment includes insulin injections. She is compliant with treatment some of the time. Her weight is stable. She is following a generally unhealthy diet. She has had a previous visit with a dietitian. She never participates in exercise. Home blood sugar record trend: She came with better blood glucose profile, A1c 7.2%. She made some insulin dosing errors covering her meals even when pre-meal blood glucose is below 90 mg/dL. An ACE inhibitor/angiotensin II receptor blocker is being taken.  Hyperlipidemia This is a chronic problem. The current episode started more than 1 year ago. Pertinent negatives include no chest pain, myalgias or shortness of breath. Risk factors for coronary artery disease include diabetes mellitus, dyslipidemia and a sedentary lifestyle.  Hypertension This is a chronic problem. The current episode started more than 1 year ago. Pertinent negatives include no chest pain,  headaches, palpitations or shortness of breath. Risk factors for coronary artery disease include diabetes mellitus, dyslipidemia and sedentary lifestyle. Past treatments include angiotensin blockers.      Review of Systems  Constitutional: Negative for fatigue and unexpected weight change.  HENT: Negative for trouble swallowing and voice change.   Eyes: Negative for visual disturbance.  Respiratory: Negative for cough, shortness of breath and wheezing.   Cardiovascular: Negative for chest pain, palpitations and leg swelling.  Gastrointestinal: Negative for nausea, vomiting and diarrhea.  Endocrine: Negative for cold intolerance, heat intolerance, polydipsia, polyphagia and polyuria.  Musculoskeletal: Negative for myalgias and arthralgias.  Skin: Negative for color change, pallor, rash and wound.  Neurological: Negative for seizures and headaches.  Psychiatric/Behavioral: Negative for suicidal ideas and confusion.    Objective:    BP 137/71 mmHg  Pulse 84  Ht 5' 1"  (1.549 m)  Wt 134 lb (60.782 kg)  BMI 25.33 kg/m2  SpO2 99%  Wt Readings from Last 3 Encounters:  12/23/15 134 lb (60.782 kg)  09/16/15 128 lb (58.06 kg)  09/15/15 129 lb (58.514 kg)    Physical Exam  Constitutional: She is oriented to person, place, and time. She appears well-developed.  HENT:  Head: Normocephalic and atraumatic.  Eyes: EOM are normal.  Neck: Normal range of motion. Neck supple. No tracheal deviation present. No thyromegaly present.  Cardiovascular: Normal rate and regular rhythm.   Pulmonary/Chest: Effort normal and breath sounds normal.  Abdominal: Soft. Bowel sounds are normal. There is no tenderness. There is no guarding.  Musculoskeletal: Normal range of motion. She exhibits no edema.  Neurological: She is alert and oriented to person, place, and time. She has normal reflexes. No cranial nerve deficit. Coordination normal.  Skin: Skin is warm and dry. No rash noted. No erythema. No pallor.   Psychiatric: She has a normal mood and affect. Judgment normal.    Results for orders placed or performed in visit on 76/16/07  Basic metabolic panel  Result Value Ref Range   Sodium 138 135 - 146 mmol/L   Potassium 4.5 3.5 - 5.3 mmol/L   Chloride 102 98 - 110 mmol/L   CO2 28 20 - 31 mmol/L   Glucose, Bld 185 (H) 65 - 99 mg/dL   BUN 31 (H) 7 - 25 mg/dL   Creat 1.50 (H) 0.60 - 0.93 mg/dL   Calcium 9.7 8.6 - 10.4 mg/dL  Hemoglobin A1c  Result Value Ref Range   Hgb A1c MFr Bld 7.2 (H) <5.7 %   Mean Plasma Glucose 160 mg/dL   Complete Blood Count (Most recent): Lab Results  Component Value Date   WBC 5.7 10/28/2014   HGB 12.8 10/28/2014   HCT 38.4 10/28/2014   MCV 90.4 10/28/2014   PLT 262 10/28/2014   Chemistry (most recent): Lab Results  Component Value Date   NA 138 12/22/2015   K 4.5 12/22/2015   CL 102 12/22/2015   CO2 28 12/22/2015   BUN 31* 12/22/2015   CREATININE 1.50* 12/22/2015   Diabetic Labs (most recent): Lab Results  Component Value Date   HGBA1C 7.2* 12/22/2015   HGBA1C 7.6* 09/15/2015   HGBA1C 9.2* 06/02/2015   Lipid profile (most recent): Lab Results  Component Value Date   TRIG 44 02/03/2015   CHOL 166 02/03/2015      Assessment & Plan:   1. Type 2 diabetes mellitus with stage 3 chronic kidney disease, with long-term current use of insulin   Patient came with significantly fluctuating blood glucose profile, and her recent A1c is improved to 7.6% from 9.2 %. Recent labs reviewed. Patient remains at a high risk for more acute and chronic complications of diabetes which include CAD, CVA, CKD, retinopathy, and neuropathy. These are all discussed in detail with the patient. I have re-counseled the patient on diet management and weight loss, by adopting a carbohydrate restricted / protein rich  Diet. Patient is advised to stick to a routine mealtimes to eat 3 meals  a day and avoid unnecessary snacks ( to snack only to correct  hypoglycemia). Patient is advised to eliminate simple carbs  from their diet including cakes desserts ice cream soda (  diet and regular) , sweet tea , Candies,  chips and cookies, artificial sweeteners,   and "sugar-free" products .  This will help patient to have stable blood glucose profile and potentially lose weight.  The patient  Has been scheduled with Jearld Fenton, RDN, CDE for individualized  DM education. I have approached patient to continue on  intensive monitoring of blood glucose and insulin therapy, and patient agrees. She would need basal bolus insulin.  However she cannot follow optimal insulin instructions for safe use. I will continue with  premixed  insulin NovoLog 70/30  15 units  QAM with breakfast and 10 units with supper when pre-meal glucose is above 90 mg/dL . She is advised to continue  strict monitoring of blood glucose before meals 3 times a day.  - Patient is warned not to take insulin without proper monitoring per orders.  -Patient is encouraged to call clinic for blood glucose levels less than 70 or above 300 mg /dl.  -Target numbers for A1c, LDL, HDL, Triglycerides, Waist Circumference were discussed in detail.  2) HTN: Controlled. Continue current medications including ACEI. 3. I will obtain lipid panel before her next visit4 4)  Weight/Diet: CDE consult, exercise, and carbs information provided.   4) Chronic Care: -Patient is on ARB  medications and encouraged to continue to follow up with Ophthalmology, Podiatrist at least yearly or according to recommendations, and advised to stay away from smoking. I have recommended yearly flu vaccine and pneumonia vaccination at least every 5 years; moderate intensity exercise for up to 150 minutes weekly; and  sleep for at least 7 hours a day.  Patient to bring meter and  blood glucose logs during their next visit.  Follow up plan: Return in about 3 months (around 03/23/2016) for diabetes, high blood pressure, follow up  with pre-visit labs, meter, and logs.  Glade Lloyd, MD Phone: [phone]  Fax: [phone]   12/23/2015, 3:38 PM

## 2016-01-13 ENCOUNTER — Telehealth: Payer: Self-pay | Admitting: Family Medicine

## 2016-01-13 ENCOUNTER — Other Ambulatory Visit: Payer: Self-pay | Admitting: Family Medicine

## 2016-01-13 DIAGNOSIS — H40013 Open angle with borderline findings, low risk, bilateral: Secondary | ICD-10-CM | POA: Diagnosis not present

## 2016-01-13 LAB — HM DIABETES EYE EXAM

## 2016-01-13 MED ORDER — ALENDRONATE SODIUM 70 MG PO TABS: ORAL_TABLET | ORAL | Status: DC

## 2016-01-13 NOTE — Telephone Encounter (Signed)
Patient is requesting a refill on her medication alendronate (FOSAMAX) 70 MG tablet called to [facility]

## 2016-01-13 NOTE — Telephone Encounter (Signed)
Med refilled.

## 2016-01-25 ENCOUNTER — Ambulatory Visit (INDEPENDENT_AMBULATORY_CARE_PROVIDER_SITE_OTHER): Payer: PPO | Admitting: Family Medicine

## 2016-01-25 ENCOUNTER — Encounter: Payer: Self-pay | Admitting: Family Medicine

## 2016-01-25 ENCOUNTER — Other Ambulatory Visit (HOSPITAL_COMMUNITY)
Admission: RE | Admit: 2016-01-25 | Discharge: 2016-01-25 | Disposition: A | Discharge status: Home / Self Care | Payer: PPO | Source: Ambulatory Visit | Attending: Family Medicine | Admitting: Family Medicine

## 2016-01-25 VITALS — BP 130/70 | HR 74 | Resp 18 | Ht 61.0 in | Wt 133.0 lb

## 2016-01-25 DIAGNOSIS — Z0000 Encounter for general adult medical examination without abnormal findings: Secondary | ICD-10-CM | POA: Diagnosis not present

## 2016-01-25 DIAGNOSIS — D259 Leiomyoma of uterus, unspecified: Secondary | ICD-10-CM

## 2016-01-25 DIAGNOSIS — E1122 Type 2 diabetes mellitus with diabetic chronic kidney disease: Secondary | ICD-10-CM

## 2016-01-25 DIAGNOSIS — Z1211 Encounter for screening for malignant neoplasm of colon: Secondary | ICD-10-CM

## 2016-01-25 DIAGNOSIS — Z124 Encounter for screening for malignant neoplasm of cervix: Secondary | ICD-10-CM | POA: Insufficient documentation

## 2016-01-25 DIAGNOSIS — N183 Chronic kidney disease, stage 3 unspecified: Secondary | ICD-10-CM

## 2016-01-25 DIAGNOSIS — Z794 Long term (current) use of insulin: Secondary | ICD-10-CM

## 2016-01-25 LAB — HEMOCCULT GUIAC POC 1CARD (OFFICE): Fecal Occult Blood, POC: NEGATIVE

## 2016-01-25 NOTE — Patient Instructions (Addendum)
F/u in 4.5 month,  Call if you need me sooner  You are referred for an [place] to re v evaluate your fibroid  You are referred to podiatrist  Thanks for choosing [facility], we consider it a privelige to serve you. PLS work on blood sugar control!

## 2016-01-25 NOTE — Progress Notes (Signed)
   Subjective:    Patient ID: Emily Jennings, female    DOB: [birth year], 82 y.o.   MRN: [id number]  HPI  Patient is in for annual physical exam. No other health concerns are expressed or addressed at the visit. Recent labs, if available are reviewed. Immunization is reviewed , and  updated if needed.   Review of Systems See HPI     Objective:   Physical Exam  BP 130/70 mmHg  Pulse 74  Resp 18  Ht 5\' 1"  (1.549 m)  Wt 133 lb 0.6 oz (60.347 kg)  BMI 25.15 kg/m2  SpO2 96%  Pleasant well nourished female, alert and oriented x 3, in no cardio-pulmonary distress. Afebrile. HEENT No facial trauma or asymetry. Sinuses non tender.  Extra occullar muscles intact, pupils equally reactive to light. External ears normal, tympanic membranes clear. Oropharynx moist, no exudate, poor dentition. Neck: supple, no adenopathy,JVD or thyromegaly.No bruits.  Chest: Clear to ascultation bilaterally.No crackles or wheezes. Non tender to palpation  Breast: Asymetry,partial left mastectomy,no masses or lumps. No tenderness. No nipple discharge or inversion. No axillary or supraclavicular adenopathy  Cardiovascular system; Heart sounds normal,  S1 and  S2 ,no S3.  No murmur, or thrill. Apical beat not displaced Peripheral pulses normal.  Abdomen: Soft, non tender, no organomegaly ,ass arising from pelvis to 18 week size No bruits. Bowel sounds normal. No guarding, tenderness or rebound.  Rectal:  Normal sphincter tone. No mass.No rectal masses.  Guaiac negative stool.  GU: External genitalia normal female genitalia , female distribution of hair. No lesions. Urethral meatus normal in size, no  Prolapse, no lesions visibly  Present. Bladder non tender. Vagina pink and moist , with no visible lesions , discharge present . Adequate pelvic support no  cystocele or rectocele noted Cervix pink and appears healthy, no lesions or ulcerations noted, no discharge noted from os Uterus  enlarged to 18 weeks, no adnexal masses, no cervical motion or adnexal tenderness.   Musculoskeletal exam: Full ROM of spine, hips , shoulders and knees. No deformity ,swelling or crepitus noted. No muscle wasting or atrophy.   Neurologic: Cranial nerves 2 to 12 intact. Power, tone ,sensation and reflexes normal throughout. No disturbance in gait. No tremor.  Skin: Intact, no ulceration, erythema , scaling or rash noted. Pigmentation normal throughout  Psych; Normal mood and affect. Judgement and concentration normal       Assessment & Plan:  Annual physical exam Annual exam as documented. Counseling done  re healthy lifestyle involving commitment to 150 minutes exercise per week, heart healthy diet, and attaining healthy weight.The importance of adequate sleep also discussed. Regular seat belt use and home safety, is also discussed. Changes in health habits are decided on by the patient with goals and time frames  set for achieving them. Immunization and cancer screening needs are specifically addressed at this visit.   Fibroid uterus Markedly enlarged, clinically appear to be larger than at last exam, ultrasound needed to more accurately evaluate

## 2016-01-26 ENCOUNTER — Encounter: Payer: Self-pay | Admitting: Family Medicine

## 2016-01-26 NOTE — Assessment & Plan Note (Signed)

## 2016-01-27 LAB — CYTOLOGY - PAP

## 2016-01-28 ENCOUNTER — Other Ambulatory Visit: Payer: Self-pay

## 2016-01-28 DIAGNOSIS — D259 Leiomyoma of uterus, unspecified: Secondary | ICD-10-CM

## 2016-01-29 ENCOUNTER — Encounter: Payer: Self-pay | Admitting: Family Medicine

## 2016-01-29 NOTE — Assessment & Plan Note (Signed)
Markedly enlarged, clinically appear to be larger than at last exam, ultrasound needed to more accurately evaluate

## 2016-02-01 ENCOUNTER — Ambulatory Visit (HOSPITAL_COMMUNITY)
Admission: RE | Admit: 2016-02-01 | Discharge: 2016-02-01 | Disposition: A | Discharge status: Home / Self Care | Payer: PPO | Source: Ambulatory Visit | Attending: Family Medicine | Admitting: Family Medicine

## 2016-02-01 DIAGNOSIS — D259 Leiomyoma of uterus, unspecified: Secondary | ICD-10-CM | POA: Insufficient documentation

## 2016-02-02 ENCOUNTER — Other Ambulatory Visit: Payer: Self-pay | Admitting: Family Medicine

## 2016-02-02 DIAGNOSIS — D259 Leiomyoma of uterus, unspecified: Secondary | ICD-10-CM

## 2016-02-28 ENCOUNTER — Telehealth: Payer: Self-pay | Admitting: Family Medicine

## 2016-02-28 NOTE — Telephone Encounter (Signed)
Pt referred to Dr Glo Herring for enlarging fibroids at an advanced age. Still no appt, PLS ensure she gets appt and give her the appt info personally, on phone andin hand   ?? pls ask Any help I can provide pls let me know

## 2016-02-28 NOTE — Telephone Encounter (Signed)
Patient is scheduled for Tues June 20th at 2:30

## 2016-03-07 ENCOUNTER — Encounter: Payer: Self-pay | Admitting: Obstetrics and Gynecology

## 2016-03-07 ENCOUNTER — Ambulatory Visit (INDEPENDENT_AMBULATORY_CARE_PROVIDER_SITE_OTHER): Payer: PPO | Admitting: Obstetrics and Gynecology

## 2016-03-07 VITALS — BP 120/60 | Wt 130.5 lb

## 2016-03-07 DIAGNOSIS — K4090 Unilateral inguinal hernia, without obstruction or gangrene, not specified as recurrent: Secondary | ICD-10-CM

## 2016-03-07 DIAGNOSIS — N852 Hypertrophy of uterus: Secondary | ICD-10-CM | POA: Diagnosis not present

## 2016-03-07 DIAGNOSIS — D259 Leiomyoma of uterus, unspecified: Secondary | ICD-10-CM | POA: Diagnosis not present

## 2016-03-07 NOTE — Progress Notes (Signed)
Patient ID: Emily Jennings, female   DOB: [birth year], 82 y.o.   MRN: [id number]   [facility] Visit  @DATE @            Patient name: Emily Jennings MRN [id number]  Date of birth: 1933/10/22  CC & HPI:  Emily Jennings is a 82 y.o. female presenting today for GYN evaluation for suspected uterine enlargement. Recent GYN exam raised the question of 18 weeks size uterine enlargement which was greater than previous visits. The patient has had a transvaginal ultrasound performed in the interim through [facility]. The paper records are reviewed and show a small 2 x 3 cm fibroid in the uterine fundus to smaller fibroids posteriorly. Ovaries are medicine within normal limits though the left ovary is greater than the right. Abdominal exam is interesting that the patient has a small left inguinal hernia, that's been there for years. She does splint and guard the abdomen she has put on weight recently in the lower abdomen it was difficult exam until the patient was able to relax  ROS:  ROS Weight gain, patient perceives her abdomen is Foley. There is no ascites is no fluid wave when the patient delivered a transverse relaxer abdomen the lower abdomen is soft  Pertinent History Reviewed:   Reviewed: Significant for  Medical         Past Medical History  Diagnosis Date  . Anxiety   . Hypertension   . Allergy   . Hyperglycemia   . Low blood potassium   . Diabetes mellitus   . Osteoporosis   . Non-insulin dependent type 2 diabetes mellitus ([place])   . Coronary artery disease     cardiac catheterization on 03/20/2006  LAD mid 40% stenosis, left circumflex mild 40% stenosis, RCA mid-vessel 40% to 50% lesion   EF 60%  . Carotid stenosis     11/16/2005  mild plaque formation and stenosis proximal right ECA  . Ventricular tachycardia, non-sustained (HCC)     developed during stress test 02/08/2006, spontaneously aborted, mild reversible apical defect  . Arthritis   . Cataract   . GERD  (gastroesophageal reflux disease)   . Complication of anesthesia   . Hernia, inguinal     left  . Shortness of breath     2D Echocardiogram 01/26/2009   EF of greater than 55%, mild MR, mild TR, normal ventricular function  . Cancer [facility]) 2009    breast, carcinoma in situ left                              Surgical Hx:    Past Surgical History  Procedure Laterality Date  . Cyst removed from left foot    . Breast lumpectomy      Left breast 2009  . Colonoscopy    . Cataract extraction w/phaco Left 10/28/2014    Procedure: PHACO EMULSION CATARACT EXTRACTION WITH INTRAOCULAR LENS IMPLANT LEFT EYE (IOC);  Surgeon: Marylynn Pearson, MD;  Location: [place];  Service: Ophthalmology;  Laterality: Left;   Medications: Reviewed & Updated - see associated section                       Current outpatient prescriptions:  .  alendronate (FOSAMAX) 70 MG tablet, TAKE 1 TABLET BY MOUTH EVERY 7 DAYS. TAKE WITH A FULL GLASS OF WATER ON AN EMPTY STOMACH, Disp: 12 tablet, Rfl: 1 .  amLODipine (  NORVASC) 10 MG tablet, TAKE 1 TABLET BY MOUTH EVERY DAY, Disp: 90 tablet, Rfl: 1 .  B-D ULTRAFINE III SHORT PEN 31G X 8 MM MISC, USE AS DIRECTED, Disp: 100 each, Rfl: 3 .  blood glucose meter kit and supplies KIT, One touch Ultra. Use up to two times daily as directed. (FOR ICD E11.65), Disp: 1 each, Rfl: 0 .  calcium-vitamin D (OSCAL WITH D) 500-200 MG-UNIT per tablet, Take 2 tablets by mouth daily with breakfast., Disp: , Rfl:  .  Insulin Syringe-Needle U-100 (INSULIN SYRINGE .5CC/31GX5/16") 31G X 5/16" 0.5 ML MISC, 1 each by Does not apply route 2 (two) times daily., Disp: 100 each, Rfl: 5 .  losartan (COZAAR) 100 MG tablet, TAKE 1 TABLET BY MOUTH EVERY DAY, Disp: 90 tablet, Rfl: 1 .  metoprolol (LOPRESSOR) 50 MG tablet, TAKE 1 TABLET BY MOUTH TWICE A DAY, Disp: 180 tablet, Rfl: 0 .  Multiple Vitamins-Minerals (MULTI COMPLETE PO), Take 1 tablet by mouth daily. Reported on 09/16/2015, Disp: , Rfl:  .  NOVOLOG MIX 70/30  (70-30) 100 UNIT/ML injection, INJECT 15 UNITS WITH BREAKFAST AND 10 UNITS WITH SUPPER WHEN BLOOD GLUCOSE IS ABOVE 90., Disp: 10 mL, Rfl: 2 .  ONE TOUCH ULTRA TEST test strip, USE TO TEST BLOOD SUGAR 2 TIMES A DAY, Disp: 100 each, Rfl: 5 .  ONETOUCH DELICA LANCETS FINE MISC, USE TO OBTAIN A BLOOD SPECIMEN 2 TIMES A DAY, Disp: 100 each, Rfl: 5 .  Potassium Chloride ER 20 MEQ TBCR, 1 tablet twice daily, Disp: 180 tablet, Rfl: 1 .  spironolactone (ALDACTONE) 25 MG tablet, TAKE ONE TABLET BY MOUTH TWICE DAILY, Disp: 180 tablet, Rfl: 1   Social History: Reviewed -  reports that she has never smoked. She has never used smokeless tobacco.  Objective Findings:  Vitals: Blood pressure 120/60, weight 130 lb 8 oz (59.194 kg).  Physical Examination: Mental status - alert, oriented to person, place, and time, normal mood, behavior, speech, dress, motor activity, and thought processes Eyes - pupils equal and reactive, extraocular eye movements intact Abdomen - no rebound tenderness noted Lower abdominal fullness which when the patient relaxes is able to be examined and there is no suspicious abnormalities there is a left inguinal hernia about 3 cm in maximum diameter and that remains unchanged from prior exams Pelvic is not repeated patient specifically denies pain at this time   Assessment & Plan:   A:  1. Minimally enlarged uterus secondary to long-standing fibroids considered stable 2. Abdominal firmness below umbilicus felt to be patient guarding due to left inguinal hernia, combined with weight gain 3. Stable 3 cm left inguinal hernia 2 years P:  1. Will do an ultrasound in 3 months to reassure patient. We'll also assess diameter of left inguinal hernia at that time as well by ultrasound 2. Patient is comfortable with this minimally interventionist plan. Do not feel that any surgical interventions or additional testing of this time are warranted

## 2016-03-12 ENCOUNTER — Other Ambulatory Visit: Payer: Self-pay | Admitting: "Endocrinology

## 2016-03-12 ENCOUNTER — Other Ambulatory Visit: Payer: Self-pay | Admitting: Family Medicine

## 2016-03-30 ENCOUNTER — Ambulatory Visit: Payer: Self-pay | Admitting: "Endocrinology

## 2016-04-06 ENCOUNTER — Other Ambulatory Visit: Payer: Self-pay | Admitting: "Endocrinology

## 2016-04-06 DIAGNOSIS — E1122 Type 2 diabetes mellitus with diabetic chronic kidney disease: Secondary | ICD-10-CM | POA: Diagnosis not present

## 2016-04-06 DIAGNOSIS — Z794 Long term (current) use of insulin: Secondary | ICD-10-CM | POA: Diagnosis not present

## 2016-04-06 DIAGNOSIS — N183 Chronic kidney disease, stage 3 (moderate): Secondary | ICD-10-CM | POA: Diagnosis not present

## 2016-04-06 LAB — BASIC METABOLIC PANEL
BUN: 27 mg/dL — AB (ref 7–25)
CHLORIDE: 103 mmol/L (ref 98–110)
CO2: 26 mmol/L (ref 20–31)
CREATININE: 1.48 mg/dL — AB (ref 0.60–0.88)
Calcium: 9.5 mg/dL (ref 8.6–10.4)
GLUCOSE: 135 mg/dL — AB (ref 65–99)
POTASSIUM: 4.7 mmol/L (ref 3.5–5.3)
Sodium: 139 mmol/L (ref 135–146)

## 2016-04-06 LAB — LIPID PANEL
CHOLESTEROL: 176 mg/dL (ref 125–200)
HDL: 110 mg/dL (ref >=46)
LDL CALC: 59 mg/dL (ref <130)
Total CHOL/HDL Ratio: 1.6 Ratio (ref <=5.0)
Triglycerides: 37 mg/dL (ref <150)
VLDL: 7 mg/dL (ref <30)

## 2016-04-07 LAB — HEMOGLOBIN A1C
Hgb A1c MFr Bld: 7.5 % — ABNORMAL HIGH (ref <5.7)
MEAN PLASMA GLUCOSE: 169 mg/dL

## 2016-04-20 ENCOUNTER — Ambulatory Visit (INDEPENDENT_AMBULATORY_CARE_PROVIDER_SITE_OTHER): Payer: PPO | Admitting: "Endocrinology

## 2016-04-20 ENCOUNTER — Encounter: Payer: Self-pay | Admitting: "Endocrinology

## 2016-04-20 VITALS — BP 151/72 | HR 81 | Wt 133.2 lb

## 2016-04-20 DIAGNOSIS — I10 Essential (primary) hypertension: Secondary | ICD-10-CM | POA: Diagnosis not present

## 2016-04-20 DIAGNOSIS — E1122 Type 2 diabetes mellitus with diabetic chronic kidney disease: Secondary | ICD-10-CM | POA: Diagnosis not present

## 2016-04-20 DIAGNOSIS — N183 Chronic kidney disease, stage 3 (moderate): Secondary | ICD-10-CM

## 2016-04-20 DIAGNOSIS — Z794 Long term (current) use of insulin: Secondary | ICD-10-CM

## 2016-04-20 NOTE — Progress Notes (Signed)
Subjective:    Patient ID: Emily Jennings, female    DOB: September 26, 1933,    Past Medical History:  Diagnosis Date  . Allergy   . Anxiety   . Arthritis   . Cancer [facility]) 2009   breast, carcinoma in situ left  . Carotid stenosis    11/16/2005  mild plaque formation and stenosis proximal right ECA  . Cataract   . Complication of anesthesia   . Coronary artery disease    cardiac catheterization on 03/20/2006  LAD mid 40% stenosis, left circumflex mild 40% stenosis, RCA mid-vessel 40% to 50% lesion   EF 60%  . Diabetes mellitus   . GERD (gastroesophageal reflux disease)   . Hernia, inguinal    left  . Hyperglycemia   . Hypertension   . Low blood potassium   . Non-insulin dependent type 2 diabetes mellitus ([place])   . Osteoporosis   . Shortness of breath    2D Echocardiogram 01/26/2009   EF of greater than 55%, mild MR, mild TR, normal ventricular function  . Ventricular tachycardia, non-sustained (HCC)    developed during stress test 02/08/2006, spontaneously aborted, mild reversible apical defect   Past Surgical History:  Procedure Laterality Date  . BREAST LUMPECTOMY     Left breast 2009  . CATARACT EXTRACTION W/PHACO Left 10/28/2014   Procedure: PHACO EMULSION CATARACT EXTRACTION WITH INTRAOCULAR LENS IMPLANT LEFT EYE (IOC);  Surgeon: Marylynn Pearson, MD;  Location: [place];  Service: Ophthalmology;  Laterality: Left;  . COLONOSCOPY    . cyst removed from left foot     Social History   Social History  . Marital status: Married    Spouse name: N/A  . Number of children: N/A  . Years of education: na   Occupational History  . retired Retired   Social History Main Topics  . Smoking status: Never Smoker  . Smokeless tobacco: Never Used  . Alcohol use No  . Drug use: No  . Sexual activity: Not Currently    Birth control/ protection: Post-menopausal   Other Topics Concern  . None   Social History Narrative  . None   Outpatient Encounter Prescriptions as of 04/20/2016   Medication Sig  . alendronate (FOSAMAX) 70 MG tablet TAKE 1 TABLET BY MOUTH EVERY 7 DAYS. TAKE WITH A FULL GLASS OF WATER ON AN EMPTY STOMACH  . amLODipine (NORVASC) 10 MG tablet TAKE 1 TABLET BY MOUTH EVERY DAY  . B-D ULTRAFINE III SHORT PEN 31G X 8 MM MISC USE AS DIRECTED  . blood glucose meter kit and supplies KIT One touch Ultra. Use up to two times daily as directed. (FOR ICD E11.65)  . calcium-vitamin D (OSCAL WITH D) 500-200 MG-UNIT per tablet Take 2 tablets by mouth daily with breakfast.  . Insulin Syringe-Needle U-100 (INSULIN SYRINGE .5CC/31GX5/16") 31G X 5/16" 0.5 ML MISC 1 each by Does not apply route 2 (two) times daily.  Marland Kitchen losartan (COZAAR) 100 MG tablet TAKE 1 TABLET BY MOUTH EVERY DAY  . metoprolol (LOPRESSOR) 50 MG tablet TAKE 1 TABLET BY MOUTH TWICE A DAY  . Multiple Vitamins-Minerals (MULTI COMPLETE PO) Take 1 tablet by mouth daily. Reported on 09/16/2015  . NOVOLOG MIX 70/30 (70-30) 100 UNIT/ML injection INJECT 15 UNITS WITH BREAKFAST AND 10 UNITS WITH SUPPER WHEN BLOOD GLUCOSE IS ABOVE 90.  Marland Kitchen ONE TOUCH ULTRA TEST test strip USE TO TEST BLOOD SUGAR 2 TIMES A DAY  . ONETOUCH DELICA LANCETS FINE MISC USE TO OBTAIN A  BLOOD SPECIMEN 2 TIMES A DAY  . Potassium Chloride ER 20 MEQ TBCR 1 tablet twice daily  . spironolactone (ALDACTONE) 25 MG tablet TAKE ONE TABLET BY MOUTH TWICE DAILY   No facility-administered encounter medications on file as of 04/20/2016.    ALLERGIES: Allergies  Allergen Reactions  . Benadryl [Diphenhydramine Hcl] Hypertension  . Citalopram     Pt unsure  . Metformin And Related Diarrhea  . Tramadol Other (See Comments)    Felt light headed and dizzy  . Tylenol [Acetaminophen] Other (See Comments)    Patient stated tylenol makes her "feel high"   VACCINATION STATUS: Immunization History  Administered Date(s) Administered  . Influenza Split 07/17/2011  . Influenza Whole 06/18/2007, 07/29/2009, 05/16/2013  . Influenza,inj,Quad PF,36+ Mos 05/19/2014   . Pneumococcal Conjugate-13 03/24/2014  . Pneumococcal Polysaccharide-23 05/20/2008  . Td 06/18/2002  . Tdap 07/17/2011    Diabetes  She presents for her follow-up diabetic visit. She has type 2 diabetes mellitus. Onset time: She was diagnosed at approximate age of 35 years. Her disease course has been improving. There are no hypoglycemic associated symptoms. Pertinent negatives for hypoglycemia include no confusion, headaches, pallor or seizures. There are no diabetic associated symptoms. Pertinent negatives for diabetes include no chest pain, no fatigue, no polydipsia, no polyphagia and no polyuria. There are no hypoglycemic complications. Symptoms are improving. Diabetic complications include nephropathy. Risk factors for coronary artery disease include diabetes mellitus, dyslipidemia, hypertension and sedentary lifestyle. Current diabetic treatment includes insulin injections. She is compliant with treatment some of the time. Her weight is stable. She is following a generally unhealthy diet. She has had a previous visit with a dietitian. She never participates in exercise. Home blood sugar record trend: She came with better blood glucose profile, A1c 7.5%. She still is making  some insulin dosing errors covering her meals even when pre-meal blood glucose is below 90 mg/dL. An ACE inhibitor/angiotensin II receptor blocker is being taken.  Hyperlipidemia  This is a chronic problem. The current episode started more than 1 year ago. Pertinent negatives include no chest pain, myalgias or shortness of breath. Risk factors for coronary artery disease include diabetes mellitus, dyslipidemia and a sedentary lifestyle.  Hypertension  This is a chronic problem. The current episode started more than 1 year ago. Pertinent negatives include no chest pain, headaches, palpitations or shortness of breath. Risk factors for coronary artery disease include diabetes mellitus, dyslipidemia and sedentary lifestyle. Past  treatments include angiotensin blockers.      Review of Systems  Constitutional: Negative for fatigue and unexpected weight change.  HENT: Negative for trouble swallowing and voice change.   Eyes: Negative for visual disturbance.  Respiratory: Negative for cough, shortness of breath and wheezing.   Cardiovascular: Negative for chest pain, palpitations and leg swelling.  Gastrointestinal: Negative for diarrhea, nausea and vomiting.  Endocrine: Negative for cold intolerance, heat intolerance, polydipsia, polyphagia and polyuria.  Musculoskeletal: Negative for arthralgias and myalgias.  Skin: Negative for color change, pallor, rash and wound.  Neurological: Negative for seizures and headaches.  Psychiatric/Behavioral: Negative for confusion and suicidal ideas.    Objective:    BP (!) 151/72   Pulse 81   Wt 133 lb 4 oz (60.4 kg)   BMI 25.18 kg/m   Wt Readings from Last 3 Encounters:  04/20/16 133 lb 4 oz (60.4 kg)  03/07/16 130 lb 8 oz (59.2 kg)  01/25/16 133 lb 0.6 oz (60.3 kg)    Physical Exam  Constitutional: She is  oriented to person, place, and time. She appears well-developed.  HENT:  Head: Normocephalic and atraumatic.  Eyes: EOM are normal.  Neck: Normal range of motion. Neck supple. No tracheal deviation present. No thyromegaly present.  Cardiovascular: Normal rate and regular rhythm.   Pulmonary/Chest: Effort normal and breath sounds normal.  Abdominal: Soft. Bowel sounds are normal. There is no tenderness. There is no guarding.  Musculoskeletal: Normal range of motion. She exhibits no edema.  Neurological: She is alert and oriented to person, place, and time. She has normal reflexes. No cranial nerve deficit. Coordination normal.  Skin: Skin is warm and dry. No rash noted. No erythema. No pallor.  Psychiatric: She has a normal mood and affect. Judgment normal.    Results for orders placed or performed in visit on 52/77/82  Basic metabolic panel  Result Value Ref  Range   Sodium 139 135 - 146 mmol/L   Potassium 4.7 3.5 - 5.3 mmol/L   Chloride 103 98 - 110 mmol/L   CO2 26 20 - 31 mmol/L   Glucose, Bld 135 (H) 65 - 99 mg/dL   BUN 27 (H) 7 - 25 mg/dL   Creat 1.48 (H) 0.60 - 0.88 mg/dL   Calcium 9.5 8.6 - 10.4 mg/dL  Lipid panel  Result Value Ref Range   Cholesterol 176 125 - 200 mg/dL   Triglycerides 37 <150 mg/dL   HDL 110 >=46 mg/dL   Total CHOL/HDL Ratio 1.6 <=5.0 Ratio   VLDL 7 <30 mg/dL   LDL Cholesterol 59 <130 mg/dL  Hemoglobin A1c  Result Value Ref Range   Hgb A1c MFr Bld 7.5 (H) <5.7 %   Mean Plasma Glucose 169 mg/dL   Complete Blood Count (Most recent): Lab Results  Component Value Date   WBC 5.7 10/28/2014   HGB 12.8 10/28/2014   HCT 38.4 10/28/2014   MCV 90.4 10/28/2014   PLT 262 10/28/2014   Chemistry (most recent): Lab Results  Component Value Date   NA 139 04/06/2016   K 4.7 04/06/2016   CL 103 04/06/2016   CO2 26 04/06/2016   BUN 27 (H) 04/06/2016   CREATININE 1.48 (H) 04/06/2016   Diabetic Labs (most recent): Lab Results  Component Value Date   HGBA1C 7.5 (H) 04/06/2016   HGBA1C 7.2 (H) 12/22/2015   HGBA1C 7.6 (H) 09/15/2015   Lipid profile (most recent): Lab Results  Component Value Date   TRIG 37 04/06/2016   CHOL 176 04/06/2016     Assessment & Plan:   1. Type 2 diabetes mellitus with stage 3 chronic kidney disease, with long-term current use of insulin   Patient came with significantly fluctuating blood glucose profile, and her recent A1c is improved to 7.6% from 9.2 %. Recent labs reviewed. Patient remains at a high risk for more acute and chronic complications of diabetes which include CAD, CVA, CKD, retinopathy, and neuropathy. These are all discussed in detail with the patient. I have re-counseled the patient on diet management and weight loss, by adopting a carbohydrate restricted / protein rich  Diet. Patient is advised to stick to a routine mealtimes to eat 3 meals  a day and avoid  unnecessary snacks ( to snack only to correct hypoglycemia). Patient is advised to eliminate simple carbs  from their diet including cakes desserts ice cream soda (  diet and regular) , sweet tea , Candies,  chips and cookies, artificial sweeteners,   and "sugar-free" products .  This will help patient to have stable blood  glucose profile and potentially lose weight.  The patient  Has been scheduled with Jearld Fenton, RDN, CDE for individualized DM education. I have approached patient to continue on  intensive monitoring of blood glucose and insulin therapy, and patient agrees. She would need basal bolus insulin.  However she cannot follow optimal insulin instructions for safe use. - The insulin dosing errors she keeps making her concerning.  - I had a long discussion with her specifically to avoid injecting insulin when pre-meal blood glucose levels are below 90.  - If this continues to be the problem by next visit, she should be considered for either placement in nursing home or group home.  - For now , I will continue with  premixed  insulin NovoLog 70/30  15 units  QAM with breakfast and 10 units with supper when pre-meal glucose is above 90 mg/dL . She is advised to continue  strict monitoring of blood glucose before meals 3 times a day.  - Patient is warned not to take insulin without proper monitoring per orders.  -Patient is encouraged to call clinic for blood glucose levels less than 70 or above 300 mg /dl.  -Target numbers for A1c, LDL, HDL, Triglycerides, Waist Circumference were discussed in detail.  2) HTN: uncontrolled. Continue current medications including ACEI. 3. Lipids: I will obtain lipid panel before her next visit. 4)  Weight/Diet: CDE consult, exercise, and carbs information provided.   4) Chronic Care: -Patient is on ARB  medications and encouraged to continue to follow up with Ophthalmology, Podiatrist at least yearly or according to recommendations, and advised to  stay away from smoking. I have recommended yearly flu vaccine and pneumonia vaccination at least every 5 years; moderate intensity exercise for up to 150 minutes weekly; and  sleep for at least 7 hours a day.  Patient to bring meter and  blood glucose logs during their next visit.  Follow up plan: Return in about 3 months (around 07/21/2016) for follow up with pre-visit labs, meter, and logs.  Glade Lloyd, MD Phone: [phone]  Fax: [phone]   04/20/2016, 11:34 AM

## 2016-05-03 ENCOUNTER — Other Ambulatory Visit: Payer: Self-pay

## 2016-05-03 MED ORDER — GLUCOSE BLOOD VI STRP: ORAL_STRIP | 5 refills | Status: DC

## 2016-05-11 ENCOUNTER — Other Ambulatory Visit: Payer: Self-pay | Admitting: Family Medicine

## 2016-05-19 IMAGING — DX DG CHEST 2V
2 series · 2 of 2 positions shown · non-contrast
Comparison: 09/29/2013

CLINICAL DATA: Chest pain for 5 months

EXAM:
CHEST  2 VIEW

[chest pa]
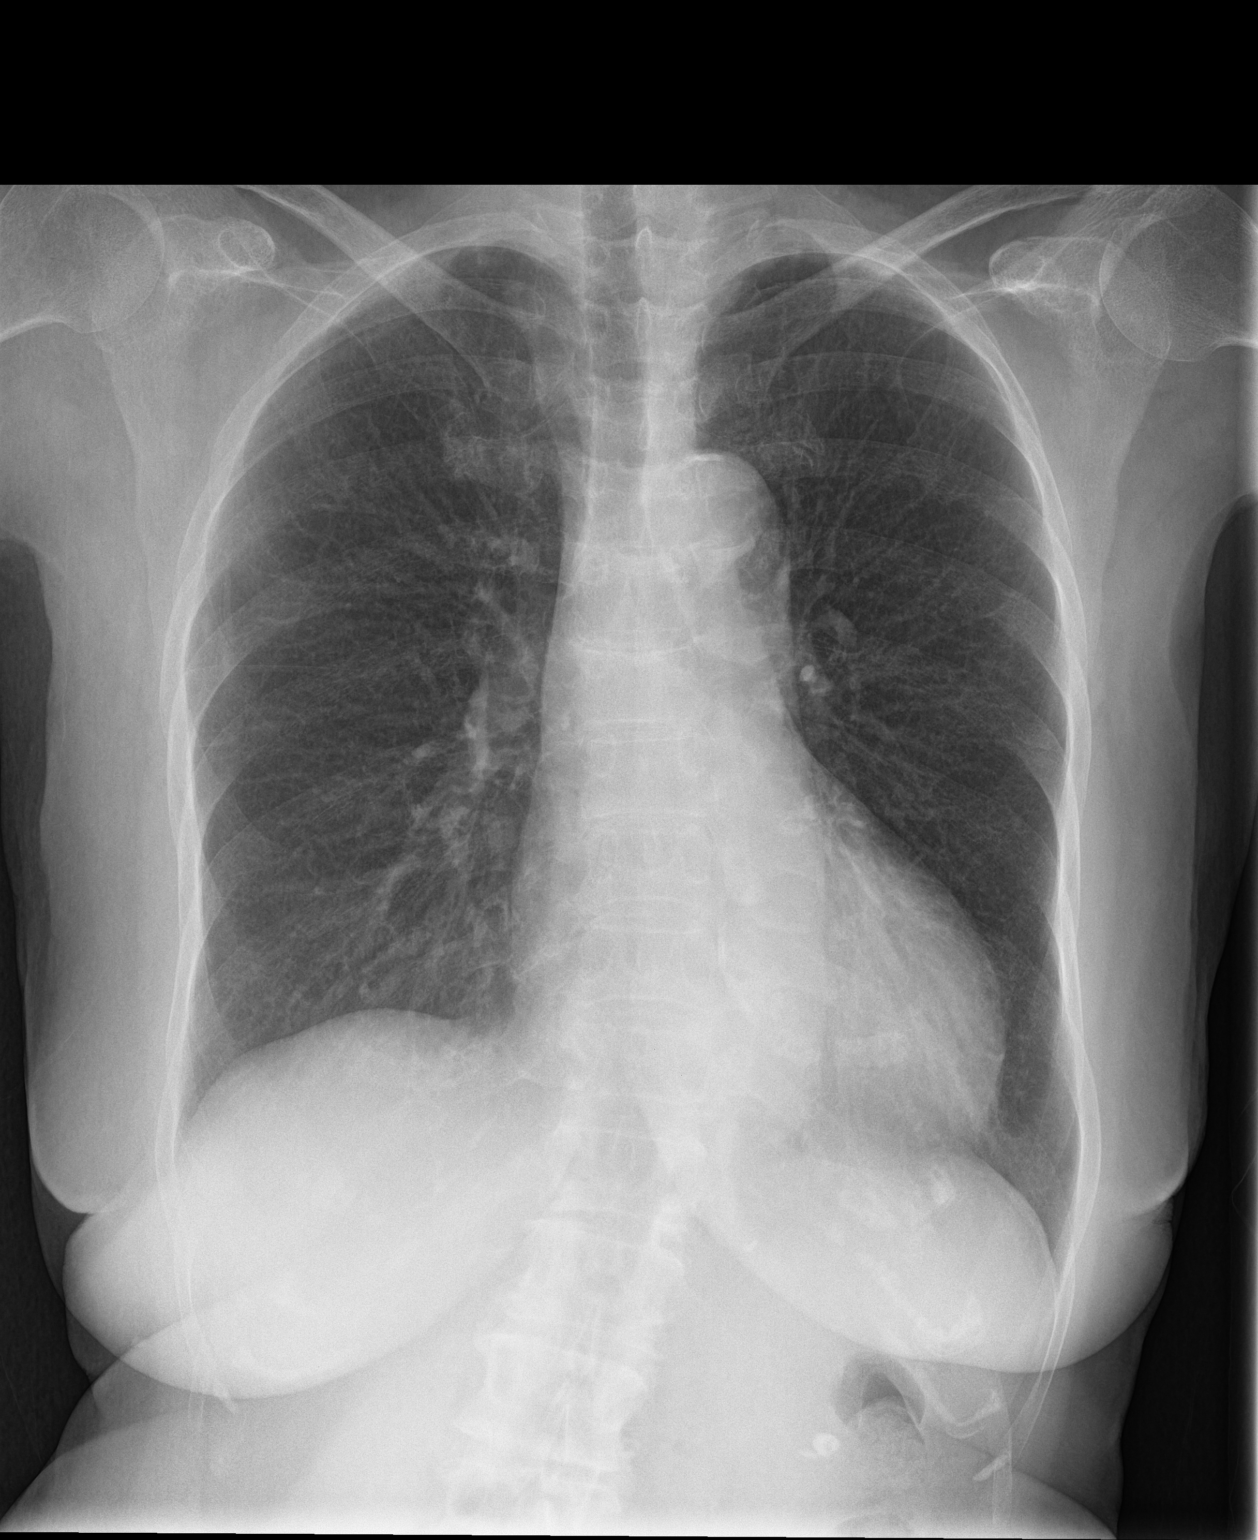

[chest lat]
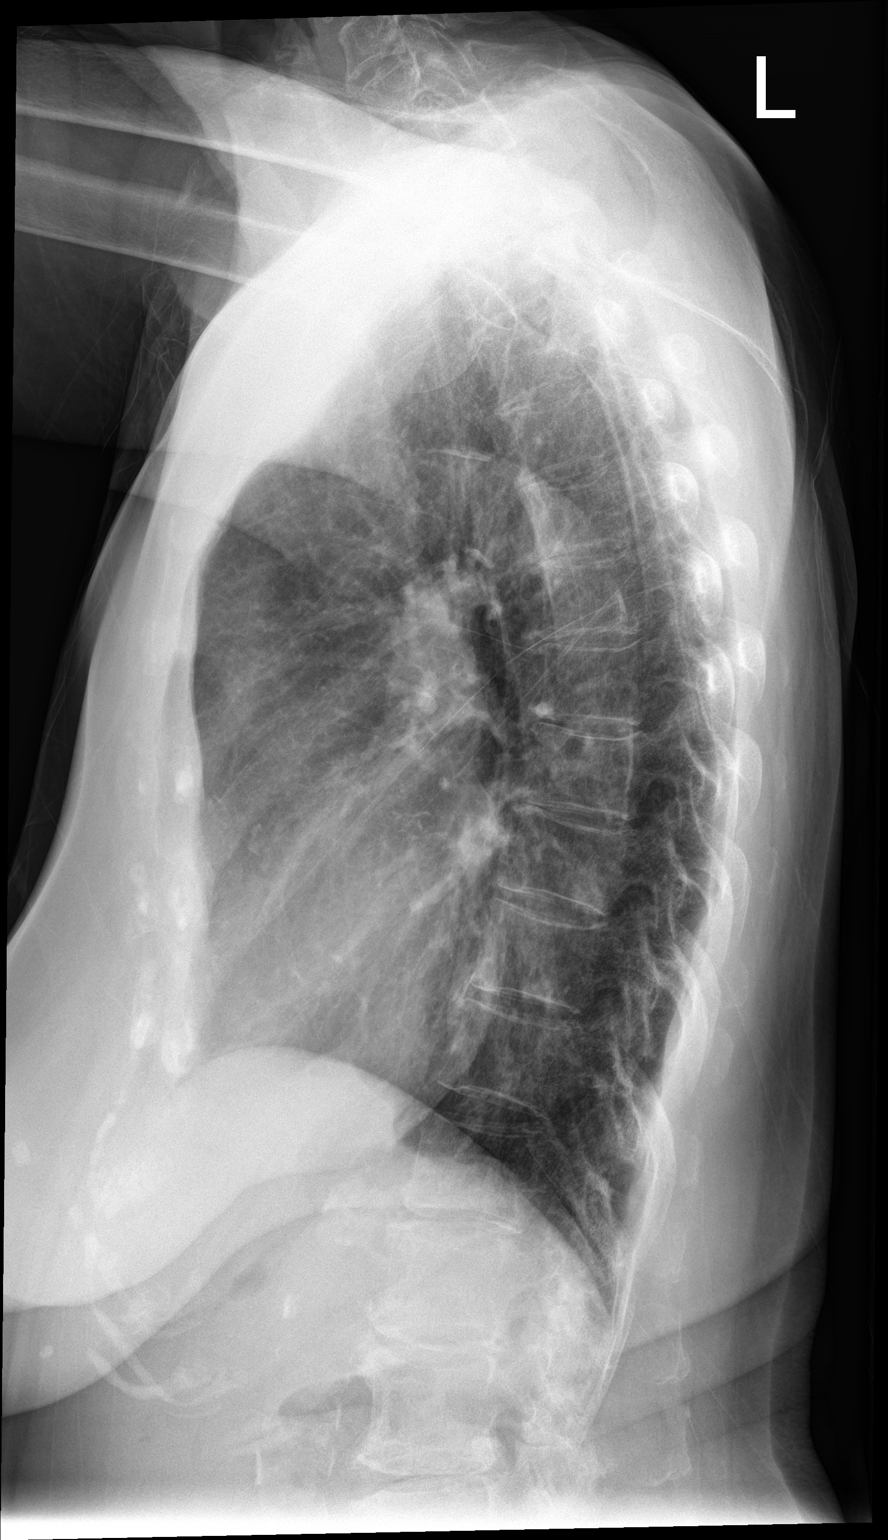

[2 of 2 positions shown; findings below may reference images not displayed]

FINDINGS: Moderate cardiomegaly. Lungs remain hyperaerated. No new
consolidation or mass. No pleural effusion or pneumothorax. Stable
scoliosis.
IMPRESSION: No active cardiopulmonary disease.

## 2016-06-07 ENCOUNTER — Other Ambulatory Visit: Payer: Self-pay

## 2016-06-07 ENCOUNTER — Telehealth: Payer: Self-pay | Admitting: Family Medicine

## 2016-06-07 NOTE — Telephone Encounter (Signed)
LM to reschedule - Dr. Moshe Cipro PAL

## 2016-06-11 ENCOUNTER — Other Ambulatory Visit: Payer: Self-pay | Admitting: "Endocrinology

## 2016-06-13 ENCOUNTER — Other Ambulatory Visit: Payer: Self-pay | Admitting: "Endocrinology

## 2016-06-14 ENCOUNTER — Ambulatory Visit: Payer: Self-pay | Admitting: Family Medicine

## 2016-06-15 ENCOUNTER — Other Ambulatory Visit: Payer: Self-pay

## 2016-06-15 ENCOUNTER — Ambulatory Visit: Payer: Self-pay | Admitting: Obstetrics and Gynecology

## 2016-06-16 ENCOUNTER — Other Ambulatory Visit: Payer: Self-pay | Admitting: Obstetrics and Gynecology

## 2016-06-16 DIAGNOSIS — N852 Hypertrophy of uterus: Secondary | ICD-10-CM

## 2016-06-16 DIAGNOSIS — H40013 Open angle with borderline findings, low risk, bilateral: Secondary | ICD-10-CM | POA: Diagnosis not present

## 2016-06-19 ENCOUNTER — Ambulatory Visit (INDEPENDENT_AMBULATORY_CARE_PROVIDER_SITE_OTHER): Payer: PPO | Admitting: Obstetrics and Gynecology

## 2016-06-19 ENCOUNTER — Encounter: Payer: Self-pay | Admitting: Obstetrics and Gynecology

## 2016-06-19 ENCOUNTER — Ambulatory Visit (INDEPENDENT_AMBULATORY_CARE_PROVIDER_SITE_OTHER): Payer: PPO

## 2016-06-19 VITALS — BP 152/56 | HR 72 | Ht 61.5 in | Wt 135.0 lb

## 2016-06-19 DIAGNOSIS — N852 Hypertrophy of uterus: Secondary | ICD-10-CM | POA: Diagnosis not present

## 2016-06-19 DIAGNOSIS — D250 Submucous leiomyoma of uterus: Secondary | ICD-10-CM | POA: Diagnosis not present

## 2016-06-19 DIAGNOSIS — R938 Abnormal findings on diagnostic imaging of other specified body structures: Secondary | ICD-10-CM | POA: Diagnosis not present

## 2016-06-19 DIAGNOSIS — N854 Malposition of uterus: Secondary | ICD-10-CM

## 2016-06-19 DIAGNOSIS — D251 Intramural leiomyoma of uterus: Secondary | ICD-10-CM

## 2016-06-19 NOTE — Progress Notes (Addendum)
Patient ID: Emily Jennings, female   DOB: March 31, 1934, 82 y.o.   MRN: [id number]   [place] [facility] Visit  @DATE @            Patient name: Emily Jennings MRN [id number]  Date of birth: 04-16-1934  CC & HPI:   Chief Complaint  Patient presents with   Follow-up    ultrasound today     Emily Jennings is a 82 y.o. female presenting today for discussion of transvaginal and pelvic US results obtained today. Pt had [place] today for continued monitoring of uterine and ovarian enlargement.   Her last [place] was 02/01/16 with the following findings:   Uterus  Measurements: 9.6 x 3.6 x 5.8 cm. There are uterine fibroids. One in the uterine fundus measuring 2.8 x 3.2 x 3.3 cm. The second is in the posterior uterine wall 1 x 1.4 x 0.8 cm.  Right ovary  Measurements: 1.7 x 0.9 x 1.2 cm. Normal appearance/no adnexal mass.    ROS:  Review of Systems  Constitutional:       Discussion only; no complaints      Pertinent History Reviewed:   Reviewed: Significant for fibroid uterus, breast lumpectomy, DM2, HTN, CAD, osteoporosis  Medical         Past Medical History:  Diagnosis Date   Allergy    Anxiety    Arthritis    Cancer ([place]) 2009   breast, carcinoma in situ left   Carotid stenosis    11/16/2005  mild plaque formation and stenosis proximal right ECA   Cataract    Complication of anesthesia    Coronary artery disease    cardiac catheterization on 03/20/2006  LAD mid 40% stenosis, left circumflex mild 40% stenosis, RCA mid-vessel 40% to 50% lesion   EF 60%   Diabetes mellitus    GERD (gastroesophageal reflux disease)    Hernia, inguinal    left   Hyperglycemia    Hypertension    Low blood potassium    Non-insulin dependent type 2 diabetes mellitus ([place])    Osteoporosis    Shortness of breath    2D Echocardiogram 01/26/2009   EF of greater than 55%, mild MR, mild TR, normal ventricular function   Ventricular tachycardia, non-sustained (HCC)    developed during  stress test 02/08/2006, spontaneously aborted, mild reversible apical defect                              Surgical Hx:    Past Surgical History:  Procedure Laterality Date   BREAST LUMPECTOMY     Left breast 2009   CATARACT EXTRACTION W/PHACO Left 10/28/2014   Procedure: PHACO EMULSION CATARACT EXTRACTION WITH INTRAOCULAR LENS IMPLANT LEFT EYE ([place]);  Surgeon: Marylynn Pearson, MD;  Location: [place];  Service: Ophthalmology;  Laterality: Left;   COLONOSCOPY     cyst removed from left foot     Medications: Reviewed & Updated - see associated section                       Current Outpatient Prescriptions:    alendronate (FOSAMAX) 70 MG tablet, TAKE 1 TABLET BY MOUTH EVERY 7 DAYS. TAKE WITH A FULL GLASS OF WATER ON AN EMPTY STOMACH, Disp: 12 tablet, Rfl: 1   amLODipine (NORVASC) 10 MG tablet, TAKE 1 TABLET BY MOUTH EVERY DAY, Disp: 90 tablet, Rfl: 1   B-D ULTRAFINE III SHORT PEN  31G X 8 MM MISC, USE AS DIRECTED, Disp: 100 each, Rfl: 3   blood glucose meter kit and supplies KIT, One touch Ultra. Use up to two times daily as directed. (FOR ICD E11.65), Disp: 1 each, Rfl: 0   calcium-vitamin D (OSCAL WITH D) 500-200 MG-UNIT per tablet, Take 2 tablets by mouth daily with breakfast., Disp: , Rfl:    glucose blood (ONE TOUCH ULTRA TEST) test strip, Use as instructed 4 x daily. E11.65, Disp: 150 each, Rfl: 5   Insulin Syringe-Needle U-100 (INSULIN SYRINGE .5CC/31GX5/16") 31G X 5/16" 0.5 ML MISC, 1 each by Does not apply route 2 (two) times daily., Disp: 100 each, Rfl: 5   KLOR-CON M20 20 MEQ tablet, TAKE 1 TABLET BY MOUTH TWICE A DAY, Disp: 180 tablet, Rfl: 1   losartan (COZAAR) 100 MG tablet, TAKE 1 TABLET BY MOUTH EVERY DAY, Disp: 90 tablet, Rfl: 1   metoprolol (LOPRESSOR) 50 MG tablet, TAKE 1 TABLET BY MOUTH TWICE A DAY, Disp: 180 tablet, Rfl: 0   Multiple Vitamins-Minerals (MULTI COMPLETE PO), Take 1 tablet by mouth daily. Reported on 09/16/2015, Disp: , Rfl:    NOVOLOG MIX 70/30 (70-30)  100 UNIT/ML injection, INJECT 15 UNITS WITH BREAKFAST AND 10 UNITS WITH SUPPER WHEN BLOOD GLUCOSE IS ABOVE 90., Disp: 10 mL, Rfl: 2   ONETOUCH DELICA LANCETS FINE MISC, USE TO OBTAIN A BLOOD SPECIMEN 2 TIMES A DAY, Disp: 100 each, Rfl: 5   spironolactone (ALDACTONE) 25 MG tablet, TAKE ONE TABLET BY MOUTH TWICE DAILY, Disp: 180 tablet, Rfl: 1   Social History: Reviewed -  reports that she has never smoked. She has never used smokeless tobacco.  Objective Findings:  Vitals: Blood pressure (!) 152/56, pulse 72, height 5' 1.5" (1.562 m), weight 135 lb (61.2 kg).  Physical Examination: Discussion only   Assessment & Plan:   A:  1. Fibroid uterus  2. [place] today non concerning for ovarian masses; no evidence of ovarian enlargement   P:  1. Repeat US in 6 months  2. F/u PRN     By signing my name below, I, Hansel Feinstein, attest that this documentation has been prepared under the direction and in the presence of Jonnie Kind, MD. Electronically Signed: Hansel Feinstein, ED Scribe. 06/19/16. 4:17 PM.  I personally performed the services described in this documentation, which was SCRIBED in my presence. The recorded information has been reviewed and considered accurate. It has been edited as necessary during review. Jonnie Kind, MD

## 2016-06-19 NOTE — Progress Notes (Signed)
PELVIC US TA/TV: heterogeneous anteverted uterus w/two fibroids (#1) intramural fibroid mid uterus 3.3 x 3.1 x 3 cm,(#2) intramural post fibroid 1 x .8 x 1 cm (no significant change),complex thickened endometrium 7.3 mm,normal ov's bilat,no pain during ultrasound,ov's appear mobile,no free fluid

## 2016-06-20 NOTE — Progress Notes (Signed)
Patient ID: Emily Jennings, female   DOB: [birth year], 82 y.o.   MRN: [id number]   [facility] [facility] Visit  @DATE @            Patient name: Emily Jennings MRN [id number]  Date of birth: 1934/01/16  CC & HPI:   Chief Complaint  Patient presents with  . Follow-up    ultrasound today     Emily Jennings is a 82 y.o. female presenting today for discussion of transvaginal and pelvic US results obtained today. Pt had [place] today for continued monitoring of uterine and ovarian enlargement.   Her last [place] was 02/01/16 with the following findings:   Uterus  Measurements: 9.6 x 3.6 x 5.8 cm. There are uterine fibroids. One in the uterine fundus measuring 2.8 x 3.2 x 3.3 cm. The second is in the posterior uterine wall 1 x 1.4 x 0.8 cm.  Right ovary  Measurements: 1.7 x 0.9 x 1.2 cm. Normal appearance/no adnexal mass.    ROS:  Review of Systems  Constitutional:       Discussion only; no complaints      Pertinent History Reviewed:   Reviewed: Significant for fibroid uterus, breast lumpectomy, DM2, HTN, CAD, osteoporosis  Medical         Past Medical History:  Diagnosis Date  . Allergy   . Anxiety   . Arthritis   . Cancer [facility]) 2009   breast, carcinoma in situ left  . Carotid stenosis    11/16/2005  mild plaque formation and stenosis proximal right ECA  . Cataract   . Complication of anesthesia   . Coronary artery disease    cardiac catheterization on 03/20/2006  LAD mid 40% stenosis, left circumflex mild 40% stenosis, RCA mid-vessel 40% to 50% lesion   EF 60%  . Diabetes mellitus   . GERD (gastroesophageal reflux disease)   . Hernia, inguinal    left  . Hyperglycemia   . Hypertension   . Low blood potassium   . Non-insulin dependent type 2 diabetes mellitus ([place])   . Osteoporosis   . Shortness of breath    2D Echocardiogram 01/26/2009   EF of greater than 55%, mild MR, mild TR, normal ventricular function  . Ventricular tachycardia, non-sustained (HCC)    developed during  stress test 02/08/2006, spontaneously aborted, mild reversible apical defect                              Surgical Hx:    Past Surgical History:  Procedure Laterality Date  . BREAST LUMPECTOMY     Left breast 2009  . CATARACT EXTRACTION W/PHACO Left 10/28/2014   Procedure: PHACO EMULSION CATARACT EXTRACTION WITH INTRAOCULAR LENS IMPLANT LEFT EYE (IOC);  Surgeon: Marylynn Pearson, MD;  Location: [place];  Service: Ophthalmology;  Laterality: Left;  . COLONOSCOPY    . cyst removed from left foot     Medications: Reviewed & Updated - see associated section                       Current Outpatient Prescriptions:  .  alendronate (FOSAMAX) 70 MG tablet, TAKE 1 TABLET BY MOUTH EVERY 7 DAYS. TAKE WITH A FULL GLASS OF WATER ON AN EMPTY STOMACH, Disp: 12 tablet, Rfl: 1 .  amLODipine (NORVASC) 10 MG tablet, TAKE 1 TABLET BY MOUTH EVERY DAY, Disp: 90 tablet, Rfl: 1 .  B-D ULTRAFINE III SHORT PEN  31G X 8 MM MISC, USE AS DIRECTED, Disp: 100 each, Rfl: 3 .  blood glucose meter kit and supplies KIT, One touch Ultra. Use up to two times daily as directed. (FOR ICD E11.65), Disp: 1 each, Rfl: 0 .  calcium-vitamin D (OSCAL WITH D) 500-200 MG-UNIT per tablet, Take 2 tablets by mouth daily with breakfast., Disp: , Rfl:  .  glucose blood (ONE TOUCH ULTRA TEST) test strip, Use as instructed 4 x daily. E11.65, Disp: 150 each, Rfl: 5 .  Insulin Syringe-Needle U-100 (INSULIN SYRINGE .5CC/31GX5/16") 31G X 5/16" 0.5 ML MISC, 1 each by Does not apply route 2 (two) times daily., Disp: 100 each, Rfl: 5 .  KLOR-CON M20 20 MEQ tablet, TAKE 1 TABLET BY MOUTH TWICE A DAY, Disp: 180 tablet, Rfl: 1 .  losartan (COZAAR) 100 MG tablet, TAKE 1 TABLET BY MOUTH EVERY DAY, Disp: 90 tablet, Rfl: 1 .  metoprolol (LOPRESSOR) 50 MG tablet, TAKE 1 TABLET BY MOUTH TWICE A DAY, Disp: 180 tablet, Rfl: 0 .  Multiple Vitamins-Minerals (MULTI COMPLETE PO), Take 1 tablet by mouth daily. Reported on 09/16/2015, Disp: , Rfl:  .  NOVOLOG MIX 70/30 (70-30)  100 UNIT/ML injection, INJECT 15 UNITS WITH BREAKFAST AND 10 UNITS WITH SUPPER WHEN BLOOD GLUCOSE IS ABOVE 90., Disp: 10 mL, Rfl: 2 .  ONETOUCH DELICA LANCETS FINE MISC, USE TO OBTAIN A BLOOD SPECIMEN 2 TIMES A DAY, Disp: 100 each, Rfl: 5 .  spironolactone (ALDACTONE) 25 MG tablet, TAKE ONE TABLET BY MOUTH TWICE DAILY, Disp: 180 tablet, Rfl: 1   Social History: Reviewed -  reports that she has never smoked. She has never used smokeless tobacco.  Objective Findings:  Vitals: Blood pressure (!) 152/56, pulse 72, height 5' 1.5" (1.562 m), weight 135 lb (61.2 kg).  Physical Examination: Discussion only   Assessment & Plan:   A:  1. Fibroid uterus  2. [place] today non concerning for ovarian masses; no evidence of ovarian enlargement   P:  1. Repeat US in 6 months  2. F/u PRN     By signing my name below, I, Hansel Feinstein, attest that this documentation has been prepared under the direction and in the presence of Jonnie Kind, MD. Electronically Signed: Hansel Feinstein, ED Scribe. 06/19/16. 4:17 PM.  I personally performed the services described in this documentation, which was SCRIBED in my presence. The recorded information has been reviewed and considered accurate. It has been edited as necessary during review. Jonnie Kind, MD

## 2016-06-26 ENCOUNTER — Encounter: Payer: Self-pay | Admitting: Family Medicine

## 2016-06-26 ENCOUNTER — Ambulatory Visit (INDEPENDENT_AMBULATORY_CARE_PROVIDER_SITE_OTHER): Payer: PPO | Admitting: Family Medicine

## 2016-06-26 VITALS — BP 116/62 | HR 67 | Temp 99.1°F | Ht 61.0 in | Wt 132.0 lb

## 2016-06-26 DIAGNOSIS — Z794 Long term (current) use of insulin: Secondary | ICD-10-CM

## 2016-06-26 DIAGNOSIS — I10 Essential (primary) hypertension: Secondary | ICD-10-CM

## 2016-06-26 DIAGNOSIS — B351 Tinea unguium: Secondary | ICD-10-CM

## 2016-06-26 DIAGNOSIS — J0100 Acute maxillary sinusitis, unspecified: Secondary | ICD-10-CM

## 2016-06-26 DIAGNOSIS — N183 Chronic kidney disease, stage 3 unspecified: Secondary | ICD-10-CM

## 2016-06-26 DIAGNOSIS — E1122 Type 2 diabetes mellitus with diabetic chronic kidney disease: Secondary | ICD-10-CM

## 2016-06-26 DIAGNOSIS — J301 Allergic rhinitis due to pollen: Secondary | ICD-10-CM | POA: Diagnosis not present

## 2016-06-26 DIAGNOSIS — M79674 Pain in right toe(s): Secondary | ICD-10-CM

## 2016-06-26 MED ORDER — MONTELUKAST SODIUM 10 MG PO TABS: 10.0000 mg | ORAL_TABLET | Freq: Every day | ORAL | 3 refills | Status: DC

## 2016-06-26 MED ORDER — AZITHROMYCIN 250 MG PO TABS: ORAL_TABLET | ORAL | 0 refills | Status: DC

## 2016-06-26 NOTE — Assessment & Plan Note (Signed)
Uncontrolled, start singulair

## 2016-06-26 NOTE — Assessment & Plan Note (Addendum)
C/o painful ingrown toenails right great toe , which are thickened , refer diabetic foot care Improved seeing endo [name] is reminded of the importance of commitment to daily physical activity for 30 minutes or more, as able and the need to limit carbohydrate intake to 30 to 60 grams per meal to help with blood sugar control.   The need to take medication as prescribed, test blood sugar as directed, and to call between visits if there is a concern that blood sugar is uncontrolled is also discussed.   Emily Jennings is reminded of the importance of daily foot exam, annual eye examination, and good blood sugar, blood pressure and cholesterol control.  Diabetic Labs Latest Ref Rng & Units 04/06/2016 12/22/2015 09/15/2015 06/02/2015 02/03/2015  HbA1c <5.7 % 7.5(H) 7.2(H) 7.6(H) 9.2(A) 9.8(H)  Microalbumin Not estab mg/dL - - 2.2 - -  Micro/Creat Ratio <30 mcg/mg creat - - 79(H) - -  Chol 125 - 200 mg/dL 176 - - - 166  HDL >=46 mg/dL 110 - - - 89  Calc LDL <130 mg/dL 59 - - - 68  Triglycerides <150 mg/dL 37 - - - 44  Creatinine 0.60 - 0.88 mg/dL 1.48(H) 1.50(H) 1.16(H) - 1.08   BP/Weight 06/26/2016 06/19/2016 04/20/2016 03/07/2016 01/25/2016 12/23/2015 87/19/5974  Systolic BP 718 550 158 682 574 935 521  Diastolic BP 62 56 72 60 70 71 78  Wt. (Lbs) 132.04 135 133.25 130.5 133.04 134 128  BMI 24.95 25.09 25.18 24.67 25.15 25.33 24.2   Foot/eye exam completion dates Latest Ref Rng & Units 01/25/2016 01/13/2016  Eye Exam No Retinopathy - No Retinopathy  Foot Form Completion - Done -

## 2016-06-26 NOTE — Assessment & Plan Note (Signed)
New onset x 4 days, azithromycin sent

## 2016-06-26 NOTE — Patient Instructions (Addendum)
Wellness visit Dec 29 or after, call if you need me before. You are being treated for sinus infection  You need to return in November for flu vaccine,Mon through Thursday, no appt needed  Blood pressure is good and sugar when last checked Is improved  You are referred for foot exam and management of right great toe pain  It is important that you exercise regularly at least 30 minutes 5 times a week. If you develop chest pain, have severe difficulty breathing, or feel very tired, stop exercising immediately and seek medical attention

## 2016-06-27 NOTE — Progress Notes (Signed)
Emily Jennings     MRN: [id number]      DOB: 1934-08-10   HPI Emily Jennings is here reporting 2 month h/o "being sick" excess cough and clear drainage, takes claritin daily 4 day h/o right sided facial pressure and yellowish nasal drainage, intermittent chills , no documented fever. C/o excess coughj occasional clear sputum in past 4 days. C/o painful right great toe, states was told ingrown and requests and needs diabetic foot care. C/o elevated blood sugars over 200 ijn past 1 to 2 weeks  ROS  Denies chest pains, palpitations and leg swelling Denies abdominal pain, nausea, vomiting,diarrhea or constipation.   Denies dysuria, frequency, hesitancy or incontinence. Denies joint pain, swelling and limitation in mobility. Denies headaches, seizures, numbness, or tingling. Denies depression, anxiety or insomnia. Denies skin break down or rash.   PE  BP 116/62 (BP Location: Left Arm, Patient Position: Sitting, Cuff Size: Normal)   Pulse 67   Temp 99.1 F (37.3 C) (Oral)   Ht 5\' 1"  (1.549 m)   Wt 132 lb 0.6 oz (59.9 kg)   SpO2 98%   BMI 24.95 kg/m   Patient alert and oriented and in no cardiopulmonary distress.  HEENT: No facial asymmetry, EOMI,   oropharynx pink and moist.  Neck supple no JVD, no mass.Right maxillary sinus tenderness, TM clear, erythema and edema of nasal mucosa Chest: Clear to auscultation bilaterally.  CVS: S1, S2 no murmurs, no S3.Regular rate.  ABD: Soft non tender.   Ext: No edema  MS: Adequate ROM spine, shoulders, hips and knees.  Skin: Intact, onychomycosis and discolored toenails on both feet , tender on palpation of right great toe  Psych: Good eye contact, normal affect. Memory intact not anxious or depressed appearing.  CNS: CN 2-12 intact, power,  normal throughout.no focal deficits noted.   Assessment & Plan  Allergic rhinitis Uncontrolled, start singulair  Maxillary sinusitis, acute New onset x 4 days, azithromycin sent  Type 2  diabetes mellitus with stage 3 chronic kidney disease (HCC) C/o painful ingrown toenails right great toe , which are thickened , refer diabetic foot care Improved seeing endo Emily Jennings is reminded of the importance of commitment to daily physical activity for 30 minutes or more, as able and the need to limit carbohydrate intake to 30 to 60 grams per meal to help with blood sugar control.   The need to take medication as prescribed, test blood sugar as directed, and to call between visits if there is a concern that blood sugar is uncontrolled is also discussed.   Emily Jennings is reminded of the importance of daily foot exam, annual eye examination, and good blood sugar, blood pressure and cholesterol control.  Diabetic Labs Latest Ref Rng & Units 04/06/2016 12/22/2015 09/15/2015 06/02/2015 02/03/2015  HbA1c <5.7 % 7.5(H) 7.2(H) 7.6(H) 9.2(A) 9.8(H)  Microalbumin Not estab mg/dL - - 2.2 - -  Micro/Creat Ratio <30 mcg/mg creat - - 79(H) - -  Chol 125 - 200 mg/dL 176 - - - 166  HDL >=46 mg/dL 110 - - - 89  Calc LDL <130 mg/dL 59 - - - 68  Triglycerides <150 mg/dL 37 - - - 44  Creatinine 0.60 - 0.88 mg/dL 1.48(H) 1.50(H) 1.16(H) - 1.08   BP/Weight 06/26/2016 06/19/2016 04/20/2016 03/07/2016 01/25/2016 12/23/2015 19/41/7408  Systolic BP 144 818 563 149 702 637 858  Diastolic BP 62 56 72 60 70 71 78  Wt. (Lbs) 132.04 135 133.25 130.5 133.04 134 128  BMI  24.95 25.09 25.18 24.67 25.15 25.33 24.2   Foot/eye exam completion dates Latest Ref Rng & Units 01/25/2016 01/13/2016  Eye Exam No Retinopathy - No Retinopathy  Foot Form Completion - Done -        Essential hypertension Controlled, no change in medication DASH diet and commitment to daily physical activity for a minimum of 30 minutes discussed and encouraged, as a part of hypertension management. The importance of attaining a healthy weight is also discussed.  BP/Weight 06/26/2016 06/19/2016 04/20/2016 03/07/2016 01/25/2016 12/23/2015 43/56/8616  Systolic BP 837  290 211 155 208 022 336  Diastolic BP 62 56 72 60 70 71 78  Wt. (Lbs) 132.04 135 133.25 130.5 133.04 134 128  BMI 24.95 25.09 25.18 24.67 25.15 25.33 24.2

## 2016-06-27 NOTE — Assessment & Plan Note (Signed)
Controlled, no change in medication DASH diet and commitment to daily physical activity for a minimum of 30 minutes discussed and encouraged, as a part of hypertension management. The importance of attaining a healthy weight is also discussed.  BP/Weight 06/26/2016 06/19/2016 04/20/2016 03/07/2016 01/25/2016 12/23/2015 78/24/2353  Systolic BP 614 431 540 086 761 950 932  Diastolic BP 62 56 72 60 70 71 78  Wt. (Lbs) 132.04 135 133.25 130.5 133.04 134 128  BMI 24.95 25.09 25.18 24.67 25.15 25.33 24.2

## 2016-06-30 ENCOUNTER — Other Ambulatory Visit: Payer: Self-pay

## 2016-06-30 DIAGNOSIS — B9689 Other specified bacterial agents as the cause of diseases classified elsewhere: Secondary | ICD-10-CM

## 2016-06-30 DIAGNOSIS — J208 Acute bronchitis due to other specified organisms: Principal | ICD-10-CM

## 2016-06-30 MED ORDER — ALBUTEROL SULFATE HFA 108 (90 BASE) MCG/ACT IN AERS: 1.0000 | INHALATION_SPRAY | Freq: Four times a day (QID) | RESPIRATORY_TRACT | 0 refills | Status: DC | PRN

## 2016-07-09 ENCOUNTER — Other Ambulatory Visit: Payer: Self-pay | Admitting: Family Medicine

## 2016-07-11 ENCOUNTER — Telehealth: Payer: Self-pay | Admitting: Family Medicine

## 2016-07-11 NOTE — Telephone Encounter (Signed)
Pt is still sick with congestion and would like a phone call back on what to do

## 2016-07-12 NOTE — Telephone Encounter (Signed)
Pt scheduled an appt to be seen tomorrow

## 2016-07-13 ENCOUNTER — Ambulatory Visit (INDEPENDENT_AMBULATORY_CARE_PROVIDER_SITE_OTHER): Payer: PPO | Admitting: Family Medicine

## 2016-07-13 ENCOUNTER — Encounter: Payer: Self-pay | Admitting: Family Medicine

## 2016-07-13 VITALS — BP 130/78 | HR 69 | Temp 97.9°F | Ht 61.0 in | Wt 133.0 lb

## 2016-07-13 DIAGNOSIS — J302 Other seasonal allergic rhinitis: Secondary | ICD-10-CM

## 2016-07-13 DIAGNOSIS — I10 Essential (primary) hypertension: Secondary | ICD-10-CM | POA: Diagnosis not present

## 2016-07-13 MED ORDER — AZELASTINE HCL 0.1 % NA SOLN: 2.0000 | Freq: Two times a day (BID) | NASAL | 12 refills | Status: DC

## 2016-07-13 NOTE — Patient Instructions (Addendum)
Keep appt as before, call iff you need me sooner  New for allergies  Is nose spray to use twice daily to nostrils, continue bedtime allergy pill  Call to send in sputum for testing  if continues to be thick and yellow in next 2 weeks

## 2016-07-14 ENCOUNTER — Ambulatory Visit (INDEPENDENT_AMBULATORY_CARE_PROVIDER_SITE_OTHER): Payer: PPO | Admitting: Podiatry

## 2016-07-14 ENCOUNTER — Encounter: Payer: Self-pay | Admitting: Podiatry

## 2016-07-14 VITALS — BP 138/68 | HR 64 | Resp 14

## 2016-07-14 DIAGNOSIS — M79676 Pain in unspecified toe(s): Secondary | ICD-10-CM | POA: Diagnosis not present

## 2016-07-14 DIAGNOSIS — E1149 Type 2 diabetes mellitus with other diabetic neurological complication: Secondary | ICD-10-CM

## 2016-07-14 DIAGNOSIS — B351 Tinea unguium: Secondary | ICD-10-CM

## 2016-07-14 NOTE — Progress Notes (Signed)
   Subjective:    Patient ID: Emily Jennings, female    DOB: [birth year], 82 y.o.   MRN: [id number]  HPI this patient presents the office with chief complaint of painful sore toes since the summer. She states that the nails are painful as she walks and wears her shoes. She denies any drainage or infection to the toenails. She presents the office today for an evaluation and treatment of these long painful nails    Review of Systems  HENT: Positive for sinus pressure.   Respiratory: Positive for cough.   All other systems reviewed and are negative.      Objective:   Physical Exam GENERAL APPEARANCE: Alert, conversant. Appropriately groomed. No acute distress.  VASCULAR: Pedal pulses are  palpable at  [facility] and PT bilateral.  Capillary refill time is immediate to all digits,  Normal temperature gradient.  Digital hair growth is present bilateral  NEUROLOGIC: sensation is diminished-absent  to 5.07 monofilament at 5/5 sites bilateral.  Light touch is intact bilateral, Muscle strength normal.  MUSCULOSKELETAL: acceptable muscle strength, tone and stability bilateral.  Intrinsic muscluature intact bilateral.  Rectus appearance of foot and digits noted bilateral.   DERMATOLOGIC: skin color, texture, and turgor are within normal limits.  No preulcerative lesions or ulcers  are seen, no interdigital maceration noted.  No open lesions present. No drainage noted.   NAILS  Thick disfigured discolored nails both feet.            Assessment & Plan:  Onychomycosis  B/L  Diabetes with neuropathy   IE  Debridement of nails.  RTC 3 months.   Gardiner Barefoot DPM

## 2016-07-15 ENCOUNTER — Encounter: Payer: Self-pay | Admitting: Family Medicine

## 2016-07-15 NOTE — Progress Notes (Signed)
Emily Jennings     MRN: [id number]      DOB: 11/23/1933   HPI Emily Jennings is here with a 3 week h/o excess cough and sneezing, primarily clear watery drainage, no fever, chills or body aches. Denies ear pain, or sore throat Denies polyuria, polydipsia, blurred vision , or hypoglycemic episodes.   ROS Denies recent fever or chills. . Denies chest pains, palpitations and leg swelling Denies abdominal pain, nausea, vomiting,diarrhea or constipation.   Denies dysuria, frequency, hesitancy or incontinence. Denies joint pain, swelling and limitation in mobility. Denies headaches, seizures, numbness, or tingling. Denies depression, anxiety or insomnia. Denies skin break down or rash.   PE  BP 130/78   Pulse 69   Temp 97.9 F (36.6 C) (Oral)   Ht 5\' 1"  (1.549 m)   Wt 133 lb (60.3 kg)   SpO2 96%   BMI 25.13 kg/m   Patient alert and oriented and in no cardiopulmonary distress.  HEENT: No facial asymmetry, EOMI,   oropharynx pink and moist.  Neck supple no JVD, no mass.Erythema and edema of nasal mucosa, no sinus tenderness, TM clear  Chest: Clear to auscultation bilaterally.  CVS: S1, S2 no murmurs, no S3.Regular rate.  ABD: Soft non tender.   Ext: No edema  MS: Adequate ROM spine, shoulders, hips and knees.  Skin: Intact, no ulcerations or rash noted.  Psych: Good eye contact, normal affect. Memory intact not anxious or depressed appearing.  CNS: CN 2-12 intact, power,  normal throughout.no focal deficits noted.   Assessment & Plan Allergic rhinitis Uncontrolled, add Astelin spray for daily use, continue oral medication, encouraged use of netty pot also   Essential hypertension Controlled, no change in medication DASH diet and commitment to daily physical activity for a minimum of 30 minutes discussed and encouraged, as a part of hypertension management. The importance of attaining a healthy weight is also discussed.  BP/Weight 07/14/2016 07/13/2016 06/26/2016  06/19/2016 04/20/2016 9/32/3557 11/18/2023  Systolic BP 427 062 376 283 151 761 607  Diastolic BP 68 78 62 56 72 60 70  Wt. (Lbs) - 133 132.04 135 133.25 130.5 133.04  BMI - 25.13 24.95 25.09 25.18 24.67 25.15       Type 2 diabetes mellitus with stage 3 chronic kidney disease (HCC) Improved and treated by endo Emily Jennings is reminded of the importance of commitment to daily physical activity for 30 minutes or more, as able and the need to limit carbohydrate intake to 30 to 60 grams per meal to help with blood sugar control.   The need to take medication as prescribed, test blood sugar as directed, and to call between visits if there is a concern that blood sugar is uncontrolled is also discussed.   Emily Jennings is reminded of the importance of daily foot exam, annual eye examination, and good blood sugar, blood pressure and cholesterol control.  Diabetic Labs Latest Ref Rng & Units 04/06/2016 12/22/2015 09/15/2015 06/02/2015 02/03/2015  HbA1c <5.7 % 7.5(H) 7.2(H) 7.6(H) 9.2(A) 9.8(H)  Microalbumin Not estab mg/dL - - 2.2 - -  Micro/Creat Ratio <30 mcg/mg creat - - 79(H) - -  Chol 125 - 200 mg/dL 176 - - - 166  HDL >=46 mg/dL 110 - - - 89  Calc LDL <130 mg/dL 59 - - - 68  Triglycerides <150 mg/dL 37 - - - 44  Creatinine 0.60 - 0.88 mg/dL 1.48(H) 1.50(H) 1.16(H) - 1.08   BP/Weight 07/14/2016 07/13/2016 06/26/2016 06/19/2016 04/20/2016 03/07/2016 01/25/2016  Systolic BP 979 150 413 643 837 793 968  Diastolic BP 68 78 62 56 72 60 70  Wt. (Lbs) - 133 132.04 135 133.25 130.5 133.04  BMI - 25.13 24.95 25.09 25.18 24.67 25.15   Foot/eye exam completion dates Latest Ref Rng & Units 01/25/2016 01/13/2016  Eye Exam No Retinopathy - No Retinopathy  Foot Form Completion - Done -

## 2016-07-15 NOTE — Assessment & Plan Note (Signed)
Improved and treated by endo Ms. Vandenheuvel is reminded of the importance of commitment to daily physical activity for 30 minutes or more, as able and the need to limit carbohydrate intake to 30 to 60 grams per meal to help with blood sugar control.   The need to take medication as prescribed, test blood sugar as directed, and to call between visits if there is a concern that blood sugar is uncontrolled is also discussed.   Ms. Weinhold is reminded of the importance of daily foot exam, annual eye examination, and good blood sugar, blood pressure and cholesterol control.  Diabetic Labs Latest Ref Rng & Units 04/06/2016 12/22/2015 09/15/2015 06/02/2015 02/03/2015  HbA1c <5.7 % 7.5(H) 7.2(H) 7.6(H) 9.2(A) 9.8(H)  Microalbumin Not estab mg/dL - - 2.2 - -  Micro/Creat Ratio <30 mcg/mg creat - - 79(H) - -  Chol 125 - 200 mg/dL 176 - - - 166  HDL >=46 mg/dL 110 - - - 89  Calc LDL <130 mg/dL 59 - - - 68  Triglycerides <150 mg/dL 37 - - - 44  Creatinine 0.60 - 0.88 mg/dL 1.48(H) 1.50(H) 1.16(H) - 1.08   BP/Weight 07/14/2016 07/13/2016 06/26/2016 06/19/2016 04/20/2016 2/62/0355 05/25/4162  Systolic BP 845 364 680 321 224 825 003  Diastolic BP 68 78 62 56 72 60 70  Wt. (Lbs) - 133 132.04 135 133.25 130.5 133.04  BMI - 25.13 24.95 25.09 25.18 24.67 25.15   Foot/eye exam completion dates Latest Ref Rng & Units 01/25/2016 01/13/2016  Eye Exam No Retinopathy - No Retinopathy  Foot Form Completion - Done -

## 2016-07-15 NOTE — Assessment & Plan Note (Signed)
Controlled, no change in medication DASH diet and commitment to daily physical activity for a minimum of 30 minutes discussed and encouraged, as a part of hypertension management. The importance of attaining a healthy weight is also discussed.  BP/Weight 07/14/2016 07/13/2016 06/26/2016 06/19/2016 04/20/2016 7/62/8315 09/24/6158  Systolic BP 737 106 269 485 462 703 500  Diastolic BP 68 78 62 56 72 60 70  Wt. (Lbs) - 133 132.04 135 133.25 130.5 133.04  BMI - 25.13 24.95 25.09 25.18 24.67 25.15

## 2016-07-15 NOTE — Assessment & Plan Note (Signed)
Uncontrolled, add Astelin spray for daily use, continue oral medication, encouraged use of netty pot also

## 2016-07-26 ENCOUNTER — Other Ambulatory Visit: Payer: Self-pay | Admitting: "Endocrinology

## 2016-07-26 DIAGNOSIS — N183 Chronic kidney disease, stage 3 (moderate): Secondary | ICD-10-CM | POA: Diagnosis not present

## 2016-07-26 DIAGNOSIS — E1122 Type 2 diabetes mellitus with diabetic chronic kidney disease: Secondary | ICD-10-CM | POA: Diagnosis not present

## 2016-07-26 DIAGNOSIS — Z794 Long term (current) use of insulin: Secondary | ICD-10-CM | POA: Diagnosis not present

## 2016-07-26 LAB — COMPLETE METABOLIC PANEL WITH GFR
ALBUMIN: 4.2 g/dL (ref 3.6–5.1)
ALK PHOS: 53 U/L (ref 33–130)
ALT: 10 U/L (ref 6–29)
AST: 18 U/L (ref 10–35)
BILIRUBIN TOTAL: 0.6 mg/dL (ref 0.2–1.2)
BUN: 26 mg/dL — AB (ref 7–25)
CALCIUM: 9.5 mg/dL (ref 8.6–10.4)
CO2: 26 mmol/L (ref 20–31)
CREATININE: 1.27 mg/dL — AB (ref 0.60–0.88)
Chloride: 98 mmol/L (ref 98–110)
GFR, Est African American: 46 mL/min — ABNORMAL LOW (ref >=60)
GFR, Est Non African American: 40 mL/min — ABNORMAL LOW (ref >=60)
GLUCOSE: 225 mg/dL — AB (ref 65–99)
POTASSIUM: 4.8 mmol/L (ref 3.5–5.3)
Sodium: 133 mmol/L — ABNORMAL LOW (ref 135–146)
TOTAL PROTEIN: 6.5 g/dL (ref 6.1–8.1)

## 2016-07-26 LAB — HEMOGLOBIN A1C
HEMOGLOBIN A1C: 7.2 % — AB (ref <5.7)
Mean Plasma Glucose: 160 mg/dL

## 2016-07-27 ENCOUNTER — Encounter: Payer: Self-pay | Admitting: "Endocrinology

## 2016-07-27 ENCOUNTER — Ambulatory Visit (INDEPENDENT_AMBULATORY_CARE_PROVIDER_SITE_OTHER): Payer: PPO | Admitting: "Endocrinology

## 2016-07-27 VITALS — BP 134/66 | HR 73 | Ht 61.0 in | Wt 132.0 lb

## 2016-07-27 DIAGNOSIS — I10 Essential (primary) hypertension: Secondary | ICD-10-CM

## 2016-07-27 DIAGNOSIS — E118 Type 2 diabetes mellitus with unspecified complications: Secondary | ICD-10-CM | POA: Diagnosis not present

## 2016-07-27 DIAGNOSIS — IMO0002 Reserved for concepts with insufficient information to code with codable children: Secondary | ICD-10-CM | POA: Insufficient documentation

## 2016-07-27 DIAGNOSIS — E119 Type 2 diabetes mellitus without complications: Secondary | ICD-10-CM | POA: Insufficient documentation

## 2016-07-27 DIAGNOSIS — E1165 Type 2 diabetes mellitus with hyperglycemia: Secondary | ICD-10-CM

## 2016-07-27 LAB — GLUCOSE, POCT (MANUAL RESULT ENTRY): POC GLUCOSE: 215 mg/dL — AB (ref 70–99)

## 2016-07-27 NOTE — Progress Notes (Signed)
Subjective:    Patient ID: Emily Jennings, female    DOB: June 13, 1934,    Past Medical History:  Diagnosis Date  . Allergy   . Anxiety   . Arthritis   . Cancer [facility]) 2009   breast, carcinoma in situ left  . Carotid stenosis    11/16/2005  mild plaque formation and stenosis proximal right ECA  . Cataract   . Complication of anesthesia   . Coronary artery disease    cardiac catheterization on 03/20/2006  LAD mid 40% stenosis, left circumflex mild 40% stenosis, RCA mid-vessel 40% to 50% lesion   EF 60%  . Diabetes mellitus   . GERD (gastroesophageal reflux disease)   . Hernia, inguinal    left  . Hyperglycemia   . Hypertension   . Low blood potassium   . Non-insulin dependent type 2 diabetes mellitus ([place])   . Osteoporosis   . Shortness of breath    2D Echocardiogram 01/26/2009   EF of greater than 55%, mild MR, mild TR, normal ventricular function  . Ventricular tachycardia, non-sustained (HCC)    developed during stress test 02/08/2006, spontaneously aborted, mild reversible apical defect   Past Surgical History:  Procedure Laterality Date  . BREAST LUMPECTOMY     Left breast 2009  . CATARACT EXTRACTION W/PHACO Left 10/28/2014   Procedure: PHACO EMULSION CATARACT EXTRACTION WITH INTRAOCULAR LENS IMPLANT LEFT EYE (IOC);  Surgeon: Marylynn Pearson, MD;  Location: [place];  Service: Ophthalmology;  Laterality: Left;  . COLONOSCOPY    . cyst removed from left foot     Social History   Social History  . Marital status: Married    Spouse name: N/A  . Number of children: N/A  . Years of education: na   Occupational History  . retired Retired   Social History Main Topics  . Smoking status: Never Smoker  . Smokeless tobacco: Never Used  . Alcohol use No  . Drug use: No  . Sexual activity: Not Currently    Birth control/ protection: Post-menopausal   Other Topics Concern  . None   Social History Narrative  . None   Outpatient Encounter Prescriptions as of 07/27/2016   Medication Sig  . acetaminophen (TYLENOL) 500 MG tablet Take 500 mg by mouth every 6 (six) hours as needed.  Marland Kitchen albuterol (PROVENTIL HFA;VENTOLIN HFA) 108 (90 Base) MCG/ACT inhaler Inhale 1-2 puffs into the lungs every 6 (six) hours as needed for wheezing or shortness of breath.  Marland Kitchen alendronate (FOSAMAX) 70 MG tablet TAKE 1 TABLET BY MOUTH EVERY 7 DAYS. TAKE WITH A FULL GLASS OF WATER ON AN EMPTY STOMACH  . amLODipine (NORVASC) 10 MG tablet TAKE 1 TABLET BY MOUTH EVERY DAY  . azelastine (ASTELIN) 0.1 % nasal spray Place 2 sprays into both nostrils 2 (two) times daily. Use in each nostril as directed  . azithromycin (ZITHROMAX) 250 MG tablet Two tablets on day one , then one tablet once daily for 4 days  . B-D ULTRAFINE III SHORT PEN 31G X 8 MM MISC USE AS DIRECTED  . blood glucose meter kit and supplies KIT One touch Ultra. Use up to two times daily as directed. (FOR ICD E11.65)  . calcium-vitamin D (OSCAL WITH D) 500-200 MG-UNIT per tablet Take 2 tablets by mouth daily with breakfast.  . glucose blood (ONE TOUCH ULTRA TEST) test strip Use as instructed 4 x daily. E11.65  . Insulin Syringe-Needle U-100 (INSULIN SYRINGE .5CC/31GX5/16") 31G X 5/16" 0.5 ML MISC  1 each by Does not apply route 2 (two) times daily.  Marland Kitchen KLOR-CON M20 20 MEQ tablet TAKE 1 TABLET BY MOUTH TWICE A DAY  . loperamide (ANTI-DIARRHEAL) 2 MG tablet Take 4 mg by mouth daily.  Marland Kitchen loratadine (ALLERGY RELIEF) 10 MG tablet Take 10 mg by mouth daily.  Marland Kitchen losartan (COZAAR) 100 MG tablet TAKE 1 TABLET BY MOUTH EVERY DAY  . metoprolol (LOPRESSOR) 50 MG tablet TAKE 1 TABLET BY MOUTH TWICE A DAY  . montelukast (SINGULAIR) 10 MG tablet Take 1 tablet (10 mg total) by mouth at bedtime.  . Multiple Vitamins-Minerals (MULTI COMPLETE PO) Take 1 tablet by mouth daily. Reported on 09/16/2015  . NOVOLOG MIX 70/30 (70-30) 100 UNIT/ML injection INJECT 15 UNITS WITH BREAKFAST AND 10 UNITS WITH SUPPER WHEN BLOOD GLUCOSE IS ABOVE 90.  Marland Kitchen ONETOUCH DELICA  LANCETS FINE MISC USE TO OBTAIN A BLOOD SPECIMEN 2 TIMES A DAY  . spironolactone (ALDACTONE) 25 MG tablet TAKE ONE TABLET BY MOUTH TWICE DAILY   No facility-administered encounter medications on file as of 07/27/2016.    ALLERGIES: Allergies  Allergen Reactions  . Benadryl [Diphenhydramine Hcl] Hypertension  . Citalopram     Pt unsure  . Metformin And Related Diarrhea  . Tramadol Other (See Comments)    Felt light headed and dizzy  . Tylenol [Acetaminophen] Other (See Comments)    Patient stated tylenol makes her "feel high"   VACCINATION STATUS: Immunization History  Administered Date(s) Administered  . Influenza Split 07/17/2011  . Influenza Whole 06/18/2007, 07/29/2009, 05/16/2013  . Influenza,inj,Quad PF,36+ Mos 05/19/2014  . Pneumococcal Conjugate-13 03/24/2014  . Pneumococcal Polysaccharide-23 05/20/2008  . Td 06/18/2002  . Tdap 07/17/2011    Diabetes  She presents for her follow-up diabetic visit. She has type 2 diabetes mellitus. Onset time: She was diagnosed at approximate age of 27 years. Her disease course has been improving. There are no hypoglycemic associated symptoms. Pertinent negatives for hypoglycemia include no confusion, headaches, pallor or seizures. There are no diabetic associated symptoms. Pertinent negatives for diabetes include no chest pain, no fatigue, no polydipsia, no polyphagia and no polyuria. There are no hypoglycemic complications. Symptoms are improving. Diabetic complications include nephropathy. Risk factors for coronary artery disease include diabetes mellitus, dyslipidemia, hypertension and sedentary lifestyle. Current diabetic treatment includes insulin injections. She is compliant with treatment some of the time. Her weight is stable. She is following a generally unhealthy diet. She has had a previous visit with a dietitian. She never participates in exercise. Home blood sugar record trend: She came with better blood glucose profile, A1c 7.5%. She  still is making  some insulin dosing errors covering her meals even when pre-meal blood glucose is below 90 mg/dL. An ACE inhibitor/angiotensin II receptor blocker is being taken.  Hyperlipidemia  This is a chronic problem. The current episode started more than 1 year ago. Pertinent negatives include no chest pain, myalgias or shortness of breath. Risk factors for coronary artery disease include diabetes mellitus, dyslipidemia and a sedentary lifestyle.  Hypertension  This is a chronic problem. The current episode started more than 1 year ago. Pertinent negatives include no chest pain, headaches, palpitations or shortness of breath. Risk factors for coronary artery disease include diabetes mellitus, dyslipidemia and sedentary lifestyle. Past treatments include angiotensin blockers.      Review of Systems  Constitutional: Negative for fatigue and unexpected weight change.  HENT: Negative for trouble swallowing and voice change.   Eyes: Negative for visual disturbance.  Respiratory: Negative  for cough, shortness of breath and wheezing.   Cardiovascular: Negative for chest pain, palpitations and leg swelling.  Gastrointestinal: Negative for diarrhea, nausea and vomiting.  Endocrine: Negative for cold intolerance, heat intolerance, polydipsia, polyphagia and polyuria.  Musculoskeletal: Negative for arthralgias and myalgias.  Skin: Negative for color change, pallor, rash and wound.  Neurological: Negative for seizures and headaches.  Psychiatric/Behavioral: Negative for confusion and suicidal ideas.    Objective:    BP 134/66   Pulse 73   Ht 5' 1"  (1.549 m)   Wt 132 lb (59.9 kg)   BMI 24.94 kg/m   Wt Readings from Last 3 Encounters:  07/27/16 132 lb (59.9 kg)  07/13/16 133 lb (60.3 kg)  06/26/16 132 lb 0.6 oz (59.9 kg)    Physical Exam  Constitutional: She is oriented to person, place, and time. She appears well-developed.  HENT:  Head: Normocephalic and atraumatic.  Eyes: EOM are  normal.  Neck: Normal range of motion. Neck supple. No tracheal deviation present. No thyromegaly present.  Cardiovascular: Normal rate and regular rhythm.   Pulmonary/Chest: Effort normal and breath sounds normal.  Abdominal: Soft. Bowel sounds are normal. There is no tenderness. There is no guarding.  Musculoskeletal: Normal range of motion. She exhibits no edema.  Neurological: She is alert and oriented to person, place, and time. She has normal reflexes. No cranial nerve deficit. Coordination normal.  Skin: Skin is warm and dry. No rash noted. No erythema. No pallor.  Psychiatric: She has a normal mood and affect. Judgment normal.    Results for orders placed or performed in visit on 07/27/16  POCT glucose (manual entry)  Result Value Ref Range   POC Glucose 215 (A) 70 - 99 mg/dl   Complete Blood Count (Most recent): Lab Results  Component Value Date   WBC 5.7 10/28/2014   HGB 12.8 10/28/2014   HCT 38.4 10/28/2014   MCV 90.4 10/28/2014   PLT 262 10/28/2014   Chemistry (most recent): Lab Results  Component Value Date   NA 133 (L) 07/26/2016   K 4.8 07/26/2016   CL 98 07/26/2016   CO2 26 07/26/2016   BUN 26 (H) 07/26/2016   CREATININE 1.27 (H) 07/26/2016   Diabetic Labs (most recent): Lab Results  Component Value Date   HGBA1C 7.2 (H) 07/26/2016   HGBA1C 7.5 (H) 04/06/2016   HGBA1C 7.2 (H) 12/22/2015   Lipid profile (most recent): Lab Results  Component Value Date   TRIG 37 04/06/2016   CHOL 176 04/06/2016     Assessment & Plan:   1. Type 2 diabetes mellitus with stage 3 chronic kidney disease, with long-term current use of insulin   Patient came with significantly fluctuating blood glucose profile, and her recent A1c is improved to 7.2% from 9.2 %. Recent labs reviewed. Patient remains at a high risk for more acute and chronic complications of diabetes which include CAD, CVA, CKD, retinopathy, and neuropathy. These are all discussed in detail with the  patient. I have re-counseled the patient on diet management and weight loss, by adopting a carbohydrate restricted / protein rich  Diet. Patient is advised to stick to a routine mealtimes to eat 3 meals  a day and avoid unnecessary snacks ( to snack only to correct hypoglycemia). Patient is advised to eliminate simple carbs  from their diet including cakes desserts ice cream soda (  diet and regular) , sweet tea , Candies,  chips and cookies, artificial sweeteners,   and "sugar-free" products .  This will help patient to have stable blood glucose profile and potentially lose weight.  The patient  Has been scheduled with Jearld Fenton, RDN, CDE for individualized DM education. I have approached patient to continue on  intensive monitoring of blood glucose and insulin therapy, and patient agrees. She would need basal bolus insulin.  However she cannot follow optimal insulin instructions for safe use. - She has done better since last visit.  - I had a long discussion with her specifically to avoid injecting insulin when pre-meal blood glucose levels are below 90.  - If this continues to be the problem by next visit, she should be considered for either placement in nursing home or group home.  - For now , I will continue with  premixed  insulin NovoLog 70/30  15 units  QAM with breakfast and 10 units with supper when pre-meal glucose is above 90 mg/dL . She is advised to continue  strict monitoring of blood glucose before meals and at bedtime.  - Patient is warned not to take insulin without proper monitoring per orders.  -Patient is encouraged to call clinic for blood glucose levels less than 70 or above 300 mg /dl.  -Target numbers for A1c, LDL, HDL, Triglycerides, Waist Circumference were discussed in detail.  2) HTN: uncontrolled. Continue current medications including ACEI. 3. Lipids: I will obtain lipid panel before her next visit. 4)  Weight/Diet: CDE consult, exercise, and carbs  information provided.   4) Chronic Care: -Patient is on ARB  medications and encouraged to continue to follow up with Ophthalmology, Podiatrist at least yearly or according to recommendations, and advised to stay away from smoking. I have recommended yearly flu vaccine and pneumonia vaccination at least every 5 years; moderate intensity exercise for up to 150 minutes weekly; and  sleep for at least 7 hours a day.  Patient to bring meter and  blood glucose logs during their next visit.  Follow up plan: Return in about 3 months (around 10/27/2016) for follow up with pre-visit labs, meter, and logs.  Glade Lloyd, MD Phone: [phone]  Fax: [phone]   07/27/2016, 3:29 PM

## 2016-08-14 ENCOUNTER — Other Ambulatory Visit: Payer: Self-pay | Admitting: "Endocrinology

## 2016-08-22 ENCOUNTER — Ambulatory Visit (INDEPENDENT_AMBULATORY_CARE_PROVIDER_SITE_OTHER): Payer: PPO

## 2016-08-22 DIAGNOSIS — Z23 Encounter for immunization: Secondary | ICD-10-CM | POA: Diagnosis not present

## 2016-08-28 ENCOUNTER — Other Ambulatory Visit: Payer: Self-pay | Admitting: Family Medicine

## 2016-09-21 ENCOUNTER — Ambulatory Visit: Payer: Self-pay

## 2016-09-28 ENCOUNTER — Ambulatory Visit (INDEPENDENT_AMBULATORY_CARE_PROVIDER_SITE_OTHER): Payer: PPO

## 2016-09-28 VITALS — BP 130/72 | HR 70 | Temp 97.6°F | Ht 61.0 in | Wt 130.1 lb

## 2016-09-28 DIAGNOSIS — Z0000 Encounter for general adult medical examination without abnormal findings: Secondary | ICD-10-CM

## 2016-09-28 DIAGNOSIS — I10 Essential (primary) hypertension: Secondary | ICD-10-CM

## 2016-09-28 DIAGNOSIS — Z780 Asymptomatic menopausal state: Secondary | ICD-10-CM

## 2016-09-28 DIAGNOSIS — N183 Chronic kidney disease, stage 3 unspecified: Secondary | ICD-10-CM

## 2016-09-28 DIAGNOSIS — Z1231 Encounter for screening mammogram for malignant neoplasm of breast: Secondary | ICD-10-CM

## 2016-09-28 DIAGNOSIS — M810 Age-related osteoporosis without current pathological fracture: Secondary | ICD-10-CM

## 2016-09-28 DIAGNOSIS — Z1239 Encounter for other screening for malignant neoplasm of breast: Secondary | ICD-10-CM

## 2016-09-28 NOTE — Patient Instructions (Addendum)
Health maintenance:Due for shingles vaccine, patient to call insurance company to see if this covered. Due for Mammogram this has been ordered today, please call and schedule   Abnormal screenings: None   Patient concerns: None   Nurse concerns: None   Next PCP appt: in 4 months with Dr. Moshe Cipro for your annual physical.  Advance directive discussed with patient today. Copy provided for patient to complete at home and have notarized. Patient agrees to have copy sent to our office once it is complete.   Fall Prevention in the Home Introduction Falls can cause injuries. They can happen to people of all ages. There are many things you can do to make your home safe and to help prevent falls. What can I do on the outside of my home?  Regularly fix the edges of walkways and driveways and fix any cracks.  Remove anything that might make you trip as you walk through a door, such as a raised step or threshold.  Trim any bushes or trees on the path to your home.  Use bright outdoor lighting.  Clear any walking paths of anything that might make someone trip, such as rocks or tools.  Regularly check to see if handrails are loose or broken. Make sure that both sides of any steps have handrails.  Any raised decks and porches should have guardrails on the edges.  Have any leaves, snow, or ice cleared regularly.  Use sand or salt on walking paths during winter.  Clean up any spills in your garage right away. This includes oil or grease spills. What can I do in the bathroom?  Use night lights.  Install grab bars by the toilet and in the tub and shower. Do not use towel bars as grab bars.  Use non-skid mats or decals in the tub or shower.  If you need to sit down in the shower, use a plastic, non-slip stool.  Keep the floor dry. Clean up any water that spills on the floor as soon as it happens.  Remove soap buildup in the tub or shower regularly.  Attach bath mats securely with  double-sided non-slip rug tape.  Do not have throw rugs and other things on the floor that can make you trip. What can I do in the bedroom?  Use night lights.  Make sure that you have a light by your bed that is easy to reach.  Do not use any sheets or blankets that are too big for your bed. They should not hang down onto the floor.  Have a firm chair that has side arms. You can use this for support while you get dressed.  Do not have throw rugs and other things on the floor that can make you trip. What can I do in the kitchen?  Clean up any spills right away.  Avoid walking on wet floors.  Keep items that you use a lot in easy-to-reach places.  If you need to reach something above you, use a strong step stool that has a grab bar.  Keep electrical cords out of the way.  Do not use floor polish or wax that makes floors slippery. If you must use wax, use non-skid floor wax.  Do not have throw rugs and other things on the floor that can make you trip. What can I do with my stairs?  Do not leave any items on the stairs.  Make sure that there are handrails on both sides of the stairs and use them.  Fix handrails that are broken or loose. Make sure that handrails are as long as the stairways.  Check any carpeting to make sure that it is firmly attached to the stairs. Fix any carpet that is loose or worn.  Avoid having throw rugs at the top or bottom of the stairs. If you do have throw rugs, attach them to the floor with carpet tape.  Make sure that you have a light switch at the top of the stairs and the bottom of the stairs. If you do not have them, ask someone to add them for you. What else can I do to help prevent falls?  Wear shoes that:  Do not have high heels.  Have rubber bottoms.  Are comfortable and fit you well.  Are closed at the toe. Do not wear sandals.  If you use a stepladder:  Make sure that it is fully opened. Do not climb a closed stepladder.  Make  sure that both sides of the stepladder are locked into place.  Ask someone to hold it for you, if possible.  Clearly mark and make sure that you can see:  Any grab bars or handrails.  First and last steps.  Where the edge of each step is.  Use tools that help you move around (mobility aids) if they are needed. These include:  Canes.  Walkers.  Scooters.  Crutches.  Turn on the lights when you go into a dark area. Replace any light bulbs as soon as they burn out.  Set up your furniture so you have a clear path. Avoid moving your furniture around.  If any of your floors are uneven, fix them.  If there are any pets around you, be aware of where they are.  Review your medicines with your doctor. Some medicines can make you feel dizzy. This can increase your chance of falling. Ask your doctor what other things that you can do to help prevent falls. This information is not intended to replace advice given to you by your health care provider. Make sure you discuss any questions you have with your health care provider. Document Released: 07/01/2009 Document Revised: 02/10/2016 Document Reviewed: 10/09/2014  2017 [employer]vier   Health Maintenance, Female Introduction Adopting a healthy lifestyle and getting preventive care can go a long way to promote health and wellness. Talk with your health care provider about what schedule of regular examinations is right for you. This is a good chance for you to check in with your provider about disease prevention and staying healthy. In between checkups, there are plenty of things you can do on your own. Experts have done a lot of research about which lifestyle changes and preventive measures are most likely to keep you healthy. Ask your health care provider for more information. Weight and diet Eat a healthy diet  Be sure to include plenty of vegetables, fruits, low-fat dairy products, and lean protein.  Do not eat a lot of foods high in  solid fats, added sugars, or salt.  Get regular exercise. This is one of the most important things you can do for your health.  Most adults should exercise for at least 150 minutes each week. The exercise should increase your heart rate and make you sweat (moderate-intensity exercise).  Most adults should also do strengthening exercises at least twice a week. This is in addition to the moderate-intensity exercise. Maintain a healthy weight  Body mass index (BMI) is a measurement that can be used to identify possible  weight problems. It estimates body fat based on height and weight. Your health care provider can help determine your BMI and help you achieve or maintain a healthy weight.  For females 48 years of age and older:  A BMI below 18.5 is considered underweight.  A BMI of 18.5 to 24.9 is normal.  A BMI of 25 to 29.9 is considered overweight.  A BMI of 30 and above is considered obese. Watch levels of cholesterol and blood lipids  You should start having your blood tested for lipids and cholesterol at 81 years of age, then have this test every 5 years.  You may need to have your cholesterol levels checked more often if:  Your lipid or cholesterol levels are high.  You are older than 81 years of age.  You are at high risk for heart disease. Cancer screening Skin Cancer  Check your skin from head to toe regularly.  Tell your health care provider about any new moles or changes in moles, especially if there is a change in a mole's shape or color.  Also tell your health care provider if you have a mole that is larger than the size of a pencil eraser.  Always use sunscreen. Apply sunscreen liberally and repeatedly throughout the day.  Protect yourself by wearing long sleeves, pants, a wide-brimmed hat, and sunglasses whenever you are outside. Heart disease, diabetes, and high blood pressure  High blood pressure causes heart disease and increases the risk of stroke. High  blood pressure is more likely to develop in:  People who have blood pressure in the high end of the normal range (130-139/85-89 mm Hg).  People who are overweight or obese.  People who are African American.  If you are 39-23 years of age, have your blood pressure checked every 3-5 years. If you are 62 years of age or older, have your blood pressure checked every year. You should have your blood pressure measured twice-once when you are at a hospital or clinic, and once when you are not at a hospital or clinic. Record the average of the two measurements. To check your blood pressure when you are not at a hospital or clinic, you can use:  An automated blood pressure machine at a pharmacy.  A home blood pressure monitor.  If you are between 49 years and 20 years old, ask your health care provider if you should take aspirin to prevent strokes.  Have regular diabetes screenings. This involves taking a blood sample to check your fasting blood sugar level.  If you are at a normal weight and have a low risk for diabetes, have this test once every three years after 81 years of age.  If you are overweight and have a high risk for diabetes, consider being tested at a younger age or more often. Preventing infection Hepatitis B  If you have a higher risk for hepatitis B, you should be screened for this virus. You are considered at high risk for hepatitis B if:  You were born in a country where hepatitis B is common. Ask your health care provider which countries are considered high risk.  Your parents were born in a high-risk country, and you have not been immunized against hepatitis B (hepatitis B vaccine).  You have HIV or AIDS.  You use needles to inject street drugs.  You live with someone who has hepatitis B.  You have had sex with someone who has hepatitis B.  You get hemodialysis treatment.  You  take certain medicines for conditions, including cancer, organ transplantation, and  autoimmune conditions. Hepatitis C  Blood testing is recommended for:  Everyone born from [birth year] through 1963.  Anyone with known risk factors for hepatitis C. Osteoporosis and menopause  Osteoporosis is a disease in which the bones lose minerals and strength with aging. This can result in serious bone fractures. Your risk for osteoporosis can be identified using a bone density scan.  If you are 31 years of age or older, or if you are at risk for osteoporosis and fractures, ask your health care provider if you should be screened.  Ask your health care provider whether you should take a calcium or vitamin D supplement to lower your risk for osteoporosis.  Menopause may have certain physical symptoms and risks.  Hormone replacement therapy may reduce some of these symptoms and risks. Talk to your health care provider about whether hormone replacement therapy is right for you. Follow these instructions at home:  Schedule regular health, dental, and eye exams.  Stay current with your immunizations.  Do not use any tobacco products including cigarettes, chewing tobacco, or electronic cigarettes.  Limit alcohol intake to no more than 1 drink per day for nonpregnant women. One drink equals 12 ounces of beer, 5 ounces of wine, or 1 ounces of hard liquor.  Do not use street drugs.  Do not share needles.  Ask your health care provider for help if you need support or information about quitting drugs.  Tell your health care provider if you often feel depressed.  Tell your health care provider if you have ever been abused or do not feel safe at home. This information is not intended to replace advice given to you by your health care provider. Make sure you discuss any questions you have with your health care provider. Document Released: 03/20/2011 Document Revised: 02/10/2016 Document Reviewed: 06/08/2015  2017 [employer]vier

## 2016-09-28 NOTE — Progress Notes (Signed)
Subjective:   Emily Jennings is a 83 y.o. female who presents for Medicare Annual (Subsequent) preventive examination.  Review of Systems:  Cardiac Risk Factors include: advanced age (>66mn, >>85women);diabetes mellitus;hypertension     Objective:     Vitals: BP 130/72   Pulse 70   Temp 97.6 F (36.4 C) (Oral)   Ht _0  (1.549 m)   Wt 130 lb 1.9 oz (59 kg)   SpO2 97%   BMI 24.59 kg/m   Body mass index is 24.59 kg/m.   Tobacco History  Smoking Status  . Never Smoker  Smokeless Tobacco  . Never Used     Counseling given: Not Answered   Past Medical History:  Diagnosis Date  . Allergy   . Anxiety   . Arthritis   . Cancer ([facility] 2009   breast, carcinoma in situ left  . Carotid stenosis    11/16/2005  mild plaque formation and stenosis proximal right ECA  . Cataract   . Complication of anesthesia   . Coronary artery disease    cardiac catheterization on 03/20/2006  LAD mid 40% stenosis, left circumflex mild 40% stenosis, RCA mid-vessel 40% to 50% lesion   EF 60%  . Diabetes mellitus   . GERD (gastroesophageal reflux disease)   . Hernia, inguinal    left  . Hyperglycemia   . Hypertension   . Low blood potassium   . Non-insulin dependent type 2 diabetes mellitus ([facility] [employer]   . Osteoporosis   . Shortness of breath    2D Echocardiogram 01/26/2009   EF of greater than 55%, mild MR, mild TR, normal ventricular function  . Ventricular tachycardia, non-sustained (HCC)    developed during stress test 02/08/2006, spontaneously aborted, mild reversible apical defect   Past Surgical History:  Procedure Laterality Date  . BREAST LUMPECTOMY     Left breast 2009  . CATARACT EXTRACTION W/PHACO Left 10/28/2014   Procedure: PHACO EMULSION CATARACT EXTRACTION WITH INTRAOCULAR LENS IMPLANT LEFT EYE (IOC);  Surgeon: RMarylynn Pearson MD;  [place]  Service: Ophthalmology;  Laterality: Left;  . COLONOSCOPY    . cyst removed from left foot     Family History  Problem Relation  Age of Onset  . Hypertension Mother   . Hyperlipidemia Mother   . Stroke Mother   . Cancer Father     pancreatic  . Colon cancer Father   . Heart disease Brother 40    bypass  . Heart disease Brother 662   bypass  . Arthritis    . Asthma    . Diabetes    . Colon cancer Paternal Aunt   . Esophageal cancer Neg Hx   . Stomach cancer Neg Hx   . Rectal cancer Neg Hx    History  Sexual Activity  . Sexual activity: Not Currently  . Birth control/ protection: Post-menopausal    Outpatient Encounter Prescriptions as of 09/28/2016  Medication Sig  . albuterol (PROVENTIL HFA;VENTOLIN HFA) 108 (90 Base) MCG/ACT inhaler Inhale 1-2 puffs into the lungs every 6 (six) hours as needed for wheezing or shortness of breath.  .Marland Kitchenalendronate (FOSAMAX) 70 MG tablet TAKE 1 TABLET BY MOUTH EVERY 7 DAYS. TAKE WITH A FULL GLASS OF WATER ON AN EMPTY STOMACH  . amLODipine (NORVASC) 10 MG tablet TAKE 1 TABLET BY MOUTH EVERY DAY  . azelastine (ASTELIN) 0.1 % nasal spray Place 2 sprays into both nostrils 2 (two) times daily. Use in each nostril as directed (Patient  taking differently: Place 2 sprays into both nostrils 2 (two) times daily as needed. Use in each nostril as directed)  . B-D ULTRAFINE III SHORT PEN 31G X 8 MM MISC USE AS DIRECTED  . blood glucose meter kit and supplies KIT One touch Ultra. Use up to two times daily as directed. (FOR ICD E11.65)  . calcium-vitamin D (OSCAL WITH D) 500-200 MG-UNIT per tablet Take 1 tablet by mouth daily with breakfast.   . Insulin Syringe-Needle U-100 (INSULIN SYRINGE .5CC/31GX5/16") 31G X 5/16" 0.5 ML MISC 1 each by Does not apply route 2 (two) times daily.  Marland Kitchen KLOR-CON M20 20 MEQ tablet TAKE 1 TABLET BY MOUTH TWICE A DAY  . loperamide (ANTI-DIARRHEAL) 2 MG tablet Take 4 mg by mouth as needed for diarrhea or loose stools.   Marland Kitchen loratadine (ALLERGY RELIEF) 10 MG tablet Take 10 mg by mouth daily as needed for allergies.   Marland Kitchen losartan (COZAAR) 100 MG tablet TAKE 1 TABLET BY  MOUTH EVERY DAY  . metoprolol (LOPRESSOR) 50 MG tablet TAKE 1 TABLET BY MOUTH TWICE A DAY  . montelukast (SINGULAIR) 10 MG tablet Take 1 tablet (10 mg total) by mouth at bedtime.  Marland Kitchen NOVOLOG MIX 70/30 (70-30) 100 UNIT/ML injection INJECT 15 UNITS WITH BREAKFAST AND 10 UNITS WITH SUPPER WHEN BLOOD GLUCOSE IS ABOVE 90.  Marland Kitchen ONE TOUCH ULTRA TEST test strip USE AS INSTRUCTED 4 X DAILY. E11.65  . ONETOUCH DELICA LANCETS FINE MISC USE TO OBTAIN A BLOOD SPECIMEN 2 TIMES A DAY  . spironolactone (ALDACTONE) 25 MG tablet TAKE ONE TABLET BY MOUTH TWICE DAILY  . acetaminophen (TYLENOL) 500 MG tablet Take 500 mg by mouth every 6 (six) hours as needed.  . [DISCONTINUED] azithromycin (ZITHROMAX) 250 MG tablet Two tablets on day one , then one tablet once daily for 4 days  . [DISCONTINUED] Multiple Vitamins-Minerals (MULTI COMPLETE PO) Take 1 tablet by mouth daily. Reported on 09/16/2015   No facility-administered encounter medications on file as of 09/28/2016.     Activities of Daily Living In your present state of health, do you have any difficulty performing the following activities: 09/28/2016  Hearing? Y  Vision? Y  Difficulty concentrating or making decisions? N  Walking or climbing stairs? N  Dressing or bathing? N  Doing errands, shopping? N  Preparing Food and eating ? N  Using the Toilet? N  In the past six months, have you accidently leaked urine? N  Do you have problems with loss of bowel control? N  Managing your Medications? N  Managing your Finances? N  Housekeeping or managing your Housekeeping? N  Some recent data might be hidden    Patient Care Team: [name], MD as PCP - General Cassandria Anger, MD as Consulting Physician (Endocrinology) Marylynn Pearson, MD as Consulting Physician (Ophthalmology)    Assessment:    Exercise Activities and Dietary recommendations Current Exercise Habits: Home exercise routine, Type of exercise: walking;stretching, Time (Minutes): 15,  Frequency (Times/Week): 2, Weekly Exercise (Minutes/Week): 30, Intensity: Mild  Goals      Patient Stated   . Exercise 3x per week (30 min per time) (pt-stated)          Starting 09/29/2016 I would like to start exercising 20 minutes a day every day of the week.      Fall Risk Fall Risk  09/28/2016 12/23/2015 09/16/2015 09/15/2015 07/21/2015  Falls in the past year? _0   Number falls in past yr: - - - - -  Injury with Fall? - - - - -  Risk for fall due to : - - - - -  Risk for fall due to (comments): - - - - -   Depression Screen PHQ 2/9 Scores 09/28/2016 06/26/2016 12/23/2015 09/16/2015  PHQ - 2 Score 0 0 0 0  PHQ- 9 Score - 0 - -     Cognitive Function  Normal   6CIT Screen 09/28/2016  What Year? 0 points  What month? 0 points  What time? 0 points  Count back from 20 0 points  Months in reverse 0 points  Repeat phrase 2 points  Total Score 2    Immunization History  Administered Date(s) Administered  . Influenza Split 07/17/2011  . Influenza Whole 06/18/2007, 07/29/2009, 05/16/2013  . Influenza,inj,Quad PF,36+ Mos 05/19/2014, 08/22/2016  . Pneumococcal Conjugate-13 03/24/2014  . Pneumococcal Polysaccharide-23 05/20/2008  . Td 06/18/2002  . Tdap 07/17/2011   Screening Tests Health Maintenance  Topic Date Due  . ZOSTAVAX  09/18/2017 (Originally 02/25/1996)  . OPHTHALMOLOGY EXAM  01/12/2017  . HEMOGLOBIN A1C  01/23/2017  . FOOT EXAM  01/24/2017  . COLONOSCOPY  12/24/2017  . TETANUS/TDAP  07/16/2021  . INFLUENZA VACCINE  Completed  . DEXA SCAN  Completed  . PNA vac Low Risk Adult  Completed      Plan:   I have personally reviewed and addressed the Medicare Annual Wellness questionnaire and have noted the following in the patient's chart:  A. Medical and social history B. Use of alcohol, tobacco or illicit drugs  C. Current medications and supplements D. Functional ability and status E.  Nutritional status F.  Physical activity G. Advance directives  discussed today, copy given to              patient today to fill out H. List of other physicians I.  Hospitalizations, surgeries, and ER visits in previous  12 months J.  [place] to include hearing, vision, cognitive,  depression L. Referrals and appointments - hearing screen,  Mammogram, and DEXA ordered today  In addition, I have reviewed and discussed with patient certain preventive protocols, quality metrics, and best practice recommendations. A written personalized care plan for preventive services as well as general preventive health recommendations were provided to patient.  Signed,   Stormy Fabian, LPN Lead Nurse Health Advisor

## 2016-10-01 NOTE — Addendum Note (Signed)
Addended by: [name] on: 10/01/2016 06:07 PM   Modules accepted: Orders

## 2016-10-02 ENCOUNTER — Other Ambulatory Visit: Payer: Self-pay | Admitting: Family Medicine

## 2016-10-02 DIAGNOSIS — Z1231 Encounter for screening mammogram for malignant neoplasm of breast: Secondary | ICD-10-CM

## 2016-10-08 ENCOUNTER — Other Ambulatory Visit: Payer: Self-pay | Admitting: Family Medicine

## 2016-10-09 ENCOUNTER — Ambulatory Visit (HOSPITAL_COMMUNITY): Payer: Self-pay

## 2016-10-10 ENCOUNTER — Ambulatory Visit: Payer: PPO | Admitting: Podiatry

## 2016-10-16 ENCOUNTER — Ambulatory Visit (HOSPITAL_COMMUNITY)
Admission: RE | Admit: 2016-10-16 | Discharge: 2016-10-16 | Disposition: A | Discharge status: Home / Self Care | Payer: PPO | Source: Ambulatory Visit | Attending: Family Medicine | Admitting: Family Medicine

## 2016-10-16 ENCOUNTER — Other Ambulatory Visit: Payer: Self-pay | Admitting: "Endocrinology

## 2016-10-16 DIAGNOSIS — Z1231 Encounter for screening mammogram for malignant neoplasm of breast: Secondary | ICD-10-CM

## 2016-10-16 DIAGNOSIS — Z853 Personal history of malignant neoplasm of breast: Secondary | ICD-10-CM | POA: Diagnosis not present

## 2016-10-23 ENCOUNTER — Other Ambulatory Visit: Payer: Self-pay | Admitting: Family Medicine

## 2016-10-31 ENCOUNTER — Other Ambulatory Visit: Payer: Self-pay | Admitting: "Endocrinology

## 2016-10-31 DIAGNOSIS — N183 Chronic kidney disease, stage 3 (moderate): Secondary | ICD-10-CM | POA: Diagnosis not present

## 2016-10-31 DIAGNOSIS — E118 Type 2 diabetes mellitus with unspecified complications: Secondary | ICD-10-CM | POA: Diagnosis not present

## 2016-10-31 DIAGNOSIS — I10 Essential (primary) hypertension: Secondary | ICD-10-CM | POA: Diagnosis not present

## 2016-10-31 DIAGNOSIS — Z780 Asymptomatic menopausal state: Secondary | ICD-10-CM | POA: Diagnosis not present

## 2016-10-31 DIAGNOSIS — E1165 Type 2 diabetes mellitus with hyperglycemia: Secondary | ICD-10-CM | POA: Diagnosis not present

## 2016-10-31 LAB — CBC
HCT: 39 % (ref 35.0–45.0)
HEMOGLOBIN: 12.5 g/dL (ref 11.7–15.5)
MCH: 30 pg (ref 27.0–33.0)
MCHC: 32.1 g/dL (ref 32.0–36.0)
MCV: 93.5 fL (ref 80.0–100.0)
MPV: 10.4 fL (ref 7.5–12.5)
PLATELETS: 250 10*3/uL (ref 140–400)
RBC: 4.17 MIL/uL (ref 3.80–5.10)
RDW: 13.8 % (ref 11.0–15.0)
WBC: 4.8 10*3/uL (ref 3.8–10.8)

## 2016-10-31 LAB — COMPLETE METABOLIC PANEL WITH GFR
ALBUMIN: 4.2 g/dL (ref 3.6–5.1)
ALK PHOS: 61 U/L (ref 33–130)
ALT: 11 U/L (ref 6–29)
AST: 19 U/L (ref 10–35)
BUN: 31 mg/dL — AB (ref 7–25)
CO2: 30 mmol/L (ref 20–31)
Calcium: 9.5 mg/dL (ref 8.6–10.4)
Chloride: 104 mmol/L (ref 98–110)
Creat: 1.48 mg/dL — ABNORMAL HIGH (ref 0.60–0.88)
GFR, Est African American: 38 mL/min — ABNORMAL LOW (ref >=60)
GFR, Est Non African American: 33 mL/min — ABNORMAL LOW (ref >=60)
GLUCOSE: 88 mg/dL (ref 65–99)
POTASSIUM: 4.4 mmol/L (ref 3.5–5.3)
SODIUM: 142 mmol/L (ref 135–146)
TOTAL PROTEIN: 6.7 g/dL (ref 6.1–8.1)
Total Bilirubin: 0.5 mg/dL (ref 0.2–1.2)

## 2016-10-31 LAB — LIPID PANEL
Cholesterol: 188 mg/dL (ref <200)
HDL: 107 mg/dL (ref >50)
LDL CALC: 73 mg/dL (ref <100)
Total CHOL/HDL Ratio: 1.8 Ratio (ref <5.0)
Triglycerides: 39 mg/dL (ref <150)
VLDL: 8 mg/dL (ref <30)

## 2016-10-31 LAB — VITAMIN D 25 HYDROXY (VIT D DEFICIENCY, FRACTURES): Vit D, 25-Hydroxy: 36 ng/mL (ref 30–100)

## 2016-11-01 ENCOUNTER — Ambulatory Visit (INDEPENDENT_AMBULATORY_CARE_PROVIDER_SITE_OTHER): Payer: PPO | Admitting: "Endocrinology

## 2016-11-01 ENCOUNTER — Encounter: Payer: Self-pay | Admitting: "Endocrinology

## 2016-11-01 VITALS — BP 130/60 | HR 66 | Resp 18 | Ht 61.0 in | Wt 135.0 lb

## 2016-11-01 DIAGNOSIS — E1122 Type 2 diabetes mellitus with diabetic chronic kidney disease: Secondary | ICD-10-CM

## 2016-11-01 DIAGNOSIS — N183 Chronic kidney disease, stage 3 unspecified: Secondary | ICD-10-CM

## 2016-11-01 DIAGNOSIS — Z794 Long term (current) use of insulin: Secondary | ICD-10-CM | POA: Diagnosis not present

## 2016-11-01 DIAGNOSIS — I10 Essential (primary) hypertension: Secondary | ICD-10-CM

## 2016-11-01 LAB — HEMOGLOBIN A1C
Hgb A1c MFr Bld: 7 % — ABNORMAL HIGH (ref <5.7)
MEAN PLASMA GLUCOSE: 154 mg/dL

## 2016-11-01 NOTE — Progress Notes (Signed)
  Subjective:    Patient ID: Emily Jennings, female    DOB: 11/10/1933,    Past Medical History:  Diagnosis Date  . Allergy   . Anxiety   . Arthritis   . Cancer (HCC) 2009   breast, carcinoma in situ left  . Carotid stenosis    11/16/2005  mild plaque formation and stenosis proximal right ECA  . Cataract   . Complication of anesthesia   . Coronary artery disease    cardiac catheterization on 03/20/2006  LAD mid 40% stenosis, left circumflex mild 40% stenosis, RCA mid-vessel 40% to 50% lesion   EF 60%  . Diabetes mellitus   . GERD (gastroesophageal reflux disease)   . Hernia, inguinal    left  . Hyperglycemia   . Hypertension   . Low blood potassium   . Non-insulin dependent type 2 diabetes mellitus (HCC)   . Osteoporosis   . Shortness of breath    2D Echocardiogram 01/26/2009   EF of greater than 55%, mild MR, mild TR, normal ventricular function  . Ventricular tachycardia, non-sustained (HCC)    developed during stress test 02/08/2006, spontaneously aborted, mild reversible apical defect   Past Surgical History:  Procedure Laterality Date  . BREAST LUMPECTOMY     Left breast 2009  . CATARACT EXTRACTION W/PHACO Left 10/28/2014   Procedure: PHACO EMULSION CATARACT EXTRACTION WITH INTRAOCULAR LENS IMPLANT LEFT EYE (IOC);  Surgeon: Roy Whitaker, MD;  Location: [facility];  Service: Ophthalmology;  Laterality: Left;  . COLONOSCOPY    . cyst removed from left foot     Social History   Social History  . Marital status: Married    Spouse name: N/A  . Number of children: N/A  . Years of education: na   Occupational History  . retired Retired   Social History Main Topics  . Smoking status: Never Smoker  . Smokeless tobacco: Never Used  . Alcohol use No  . Drug use: No  . Sexual activity: Not Currently    Birth control/ protection: Post-menopausal   Other Topics Concern  . None   Social History Narrative  . None   Outpatient Encounter Prescriptions as of 11/01/2016   Medication Sig  . acetaminophen (TYLENOL) 500 MG tablet Take 500 mg by mouth every 6 (six) hours as needed.  . albuterol (PROVENTIL HFA;VENTOLIN HFA) 108 (90 Base) MCG/ACT inhaler Inhale 1-2 puffs into the lungs every 6 (six) hours as needed for wheezing or shortness of breath.  . alendronate (FOSAMAX) 70 MG tablet TAKE 1 TABLET BY MOUTH EVERY 7 DAYS. TAKE WITH A FULL GLASS OF WATER ON AN EMPTY STOMACH  . amLODipine (NORVASC) 10 MG tablet TAKE 1 TABLET BY MOUTH EVERY DAY  . azelastine (ASTELIN) 0.1 % nasal spray Place 2 sprays into both nostrils 2 (two) times daily. Use in each nostril as directed (Patient taking differently: Place 2 sprays into both nostrils 2 (two) times daily as needed. Use in each nostril as directed)  . B-D INS SYRINGE 0.5CC/31GX5/16 31G X 5/16" 0.5 ML MISC 1 EACH BY DOES NOT APPLY ROUTE 2 (TWO) TIMES DAILY.  . B-D ULTRAFINE III SHORT PEN 31G X 8 MM MISC USE AS DIRECTED  . blood glucose meter kit and supplies KIT One touch Ultra. Use up to two times daily as directed. (FOR ICD E11.65)  . calcium-vitamin D (OSCAL WITH D) 500-200 MG-UNIT per tablet Take 1 tablet by mouth daily with breakfast.   . KLOR-CON M20 20 MEQ tablet   TAKE 1 TABLET BY MOUTH TWICE A DAY  . loperamide (ANTI-DIARRHEAL) 2 MG tablet Take 4 mg by mouth as needed for diarrhea or loose stools.   Marland Kitchen loratadine (ALLERGY RELIEF) 10 MG tablet Take 10 mg by mouth daily as needed for allergies.   Marland Kitchen losartan (COZAAR) 100 MG tablet TAKE 1 TABLET BY MOUTH EVERY DAY  . metoprolol (LOPRESSOR) 50 MG tablet TAKE 1 TABLET BY MOUTH TWICE A DAY  . montelukast (SINGULAIR) 10 MG tablet TAKE 1 TABLET (10 MG TOTAL) BY MOUTH AT BEDTIME.  Marland Kitchen NOVOLOG MIX 70/30 (70-30) 100 UNIT/ML injection INJECT 15 UNITS WITH BREAKFAST AND 10 UNITS WITH SUPPER WHEN BLOOD GLUCOSE IS ABOVE 90.  Marland Kitchen ONE TOUCH ULTRA TEST test strip USE AS INSTRUCTED 4 X DAILY. E11.65  . ONETOUCH DELICA LANCETS FINE MISC USE TO OBTAIN A BLOOD SPECIMEN 2 TIMES A DAY  .  spironolactone (ALDACTONE) 25 MG tablet TAKE ONE TABLET BY MOUTH TWICE DAILY   No facility-administered encounter medications on file as of 11/01/2016.    ALLERGIES: Allergies  Allergen Reactions  . Benadryl [Diphenhydramine Hcl] Hypertension  . Citalopram     Pt unsure  . Metformin And Related Diarrhea  . Tramadol Other (See Comments)    Felt light headed and dizzy  . Tylenol [Acetaminophen] Other (See Comments)    Patient stated tylenol makes her "feel high"   VACCINATION STATUS: Immunization History  Administered Date(s) Administered  . Influenza Split 07/17/2011  . Influenza Whole 06/18/2007, 07/29/2009, 05/16/2013  . Influenza,inj,Quad PF,36+ Mos 05/19/2014, 08/22/2016  . Pneumococcal Conjugate-13 03/24/2014  . Pneumococcal Polysaccharide-23 05/20/2008  . Td 06/18/2002  . Tdap 07/17/2011    Diabetes  She presents for her follow-up diabetic visit. She has type 2 diabetes mellitus. Onset time: She was diagnosed at approximate age of 18 years. Her disease course has been improving. There are no hypoglycemic associated symptoms. Pertinent negatives for hypoglycemia include no confusion, headaches, pallor or seizures. There are no diabetic associated symptoms. Pertinent negatives for diabetes include no chest pain, no fatigue, no polydipsia, no polyphagia and no polyuria. There are no hypoglycemic complications. Symptoms are improving. Diabetic complications include nephropathy. Risk factors for coronary artery disease include diabetes mellitus, dyslipidemia, hypertension and sedentary lifestyle. Current diabetic treatment includes insulin injections. She is compliant with treatment some of the time. Her weight is stable. She is following a generally unhealthy diet. She has had a previous visit with a dietitian. She never participates in exercise. Home blood sugar record trend: She came with better blood glucose profile, A1c 7.5%. She still is making  some insulin dosing errors covering  her meals even when pre-meal blood glucose is below 90 mg/dL. Her breakfast blood glucose range is generally 140-180 mg/dl. Her lunch blood glucose range is generally 140-180 mg/dl. Her dinner blood glucose range is generally 130-140 mg/dl. Her overall blood glucose range is 140-180 mg/dl. An ACE inhibitor/angiotensin II receptor blocker is being taken.  Hyperlipidemia  This is a chronic problem. The current episode started more than 1 year ago. Pertinent negatives include no chest pain, myalgias or shortness of breath. Risk factors for coronary artery disease include diabetes mellitus, dyslipidemia and a sedentary lifestyle.  Hypertension  This is a chronic problem. The current episode started more than 1 year ago. Pertinent negatives include no chest pain, headaches, palpitations or shortness of breath. Risk factors for coronary artery disease include diabetes mellitus, dyslipidemia and sedentary lifestyle. Past treatments include angiotensin blockers.      Review of  Systems  Constitutional: Negative for fatigue and unexpected weight change.  HENT: Negative for trouble swallowing and voice change.   Eyes: Negative for visual disturbance.  Respiratory: Negative for cough, shortness of breath and wheezing.   Cardiovascular: Negative for chest pain, palpitations and leg swelling.  Gastrointestinal: Negative for diarrhea, nausea and vomiting.  Endocrine: Negative for cold intolerance, heat intolerance, polydipsia, polyphagia and polyuria.  Musculoskeletal: Negative for arthralgias and myalgias.  Skin: Negative for color change, pallor, rash and wound.  Neurological: Negative for seizures and headaches.  Psychiatric/Behavioral: Negative for confusion and suicidal ideas.    Objective:    BP 130/60   Pulse 66   Resp 18   Ht 5' 1" (1.549 m)   Wt 135 lb (61.2 kg)   SpO2 98%   BMI 25.51 kg/m   Wt Readings from Last 3 Encounters:  11/01/16 135 lb (61.2 kg)  09/28/16 130 lb 1.9 oz (59 kg)   07/27/16 132 lb (59.9 kg)    Physical Exam  Constitutional: She is oriented to person, place, and time. She appears well-developed.  HENT:  Head: Normocephalic and atraumatic.  Eyes: EOM are normal.  Neck: Normal range of motion. Neck supple. No tracheal deviation present. No thyromegaly present.  Cardiovascular: Normal rate and regular rhythm.   Pulmonary/Chest: Effort normal and breath sounds normal.  Abdominal: Soft. Bowel sounds are normal. There is no tenderness. There is no guarding.  Musculoskeletal: Normal range of motion. She exhibits no edema.  Neurological: She is alert and oriented to person, place, and time. She has normal reflexes. No cranial nerve deficit. Coordination normal.  Skin: Skin is warm and dry. No rash noted. No erythema. No pallor.  Psychiatric: She has a normal mood and affect. Judgment normal.    Results for orders placed or performed in visit on 10/31/16  Hemoglobin A1c  Result Value Ref Range   Hgb A1c MFr Bld 7.0 (H) <5.7 %   Mean Plasma Glucose 154 mg/dL   Complete Blood Count (Most recent): Lab Results  Component Value Date   WBC 4.8 10/31/2016   HGB 12.5 10/31/2016   HCT 39.0 10/31/2016   MCV 93.5 10/31/2016   PLT 250 10/31/2016   Chemistry (most recent): Lab Results  Component Value Date   NA 142 10/31/2016   K 4.4 10/31/2016   CL 104 10/31/2016   CO2 30 10/31/2016   BUN 31 (H) 10/31/2016   CREATININE 1.48 (H) 10/31/2016   Diabetic Labs (most recent): Lab Results  Component Value Date   HGBA1C 7.0 (H) 10/31/2016   HGBA1C 7.2 (H) 07/26/2016   HGBA1C 7.5 (H) 04/06/2016   Lipid profile (most recent): Lab Results  Component Value Date   TRIG 39 10/31/2016   CHOL 188 10/31/2016     Assessment & Plan:   1. Type 2 diabetes mellitus with stage 3 chronic kidney disease, with long-term current use of insulin   Patient came with significantly fluctuating blood glucose profile With tight bedtime blood glucose profile, her recent  A1c is improved to 7% from 9.2 %. Recent labs reviewed. Patient remains at a high risk for more acute and chronic complications of diabetes which include CAD, CVA, CKD, retinopathy, and neuropathy. These are all discussed in detail with the patient. I have re-counseled the patient on diet management and weight loss, by adopting a carbohydrate restricted / protein rich  Diet. Patient is advised to stick to a routine mealtimes to eat 3 meals  a day and avoid unnecessary snacks (  to snack only to correct hypoglycemia). Patient is advised to eliminate simple carbs  from their diet including cakes desserts ice cream soda (  diet and regular) , sweet tea , Candies,  chips and cookies, artificial sweeteners,   and "sugar-free" products .  This will help patient to have stable blood glucose profile and potentially lose weight.  The patient  Has been scheduled with Jearld Fenton, RDN, CDE for individualized DM education. I have approached patient to continue on  intensive monitoring of blood glucose and insulin therapy, and patient agrees. She would need basal bolus insulin.  However she cannot follow optimal insulin instructions for safe use. - She has done better since last visit.  - I had a long discussion with her specifically to avoid injecting insulin when pre-meal blood glucose levels are below 90.  - If this continues to be the problem by next visit, she should be considered for either placement in nursing home or group home.  - For now , I will continue with  premixed  insulin NovoLog 70/30  15 units  only with breakfast when pre-meal glucose is above 90 mg/dL . She is advised to continue  strict monitoring of blood glucose before meals and at bedtime.  - Patient is warned not to take insulin without proper monitoring per orders.  -Patient is encouraged to call clinic for blood glucose levels less than 70 or above 300 mg /dl.  -Target numbers for A1c, LDL, HDL, Triglycerides, Waist Circumference  were discussed in detail.  2) HTN: uncontrolled. Continue current medications including ACEI. 3. Lipids: I will obtain lipid panel before her next visit. 4)  Weight/Diet: CDE consult, exercise, and carbs information provided.   4) Chronic Care: -Patient is on [facility]B  medications and encouraged to continue to follow up with Ophthalmology, Podiatrist at least yearly or according to recommendations, and advised to stay away from smoking. I have recommended yearly flu vaccine and pneumonia vaccination at least every 5 years; moderate intensity exercise for up to 150 minutes weekly; and  sleep for at least 7 hours a day.  Patient to bring meter and  blood glucose logs during their next visit.  Follow up plan: Return in about 3 months (around 01/29/2017) for follow up with pre-visit labs, meter, and logs.  Glade Lloyd, MD Phone: [phone]  Fax: [phone]   11/01/2016, 4:15 PM

## 2016-11-08 ENCOUNTER — Other Ambulatory Visit: Payer: Self-pay | Admitting: Family Medicine

## 2016-11-08 ENCOUNTER — Other Ambulatory Visit: Payer: Self-pay | Admitting: "Endocrinology

## 2016-11-28 ENCOUNTER — Encounter: Payer: Self-pay | Admitting: Family Medicine

## 2016-11-28 ENCOUNTER — Ambulatory Visit (INDEPENDENT_AMBULATORY_CARE_PROVIDER_SITE_OTHER): Payer: PPO | Admitting: Family Medicine

## 2016-11-28 VITALS — BP 136/70 | HR 65 | Resp 15 | Ht 61.0 in | Wt 135.0 lb

## 2016-11-28 DIAGNOSIS — N183 Chronic kidney disease, stage 3 (moderate): Secondary | ICD-10-CM

## 2016-11-28 DIAGNOSIS — H6123 Impacted cerumen, bilateral: Secondary | ICD-10-CM

## 2016-11-28 DIAGNOSIS — Z794 Long term (current) use of insulin: Secondary | ICD-10-CM

## 2016-11-28 DIAGNOSIS — J302 Other seasonal allergic rhinitis: Secondary | ICD-10-CM

## 2016-11-28 DIAGNOSIS — H6121 Impacted cerumen, right ear: Secondary | ICD-10-CM

## 2016-11-28 DIAGNOSIS — D0591 Unspecified type of carcinoma in situ of right breast: Secondary | ICD-10-CM

## 2016-11-28 DIAGNOSIS — E1122 Type 2 diabetes mellitus with diabetic chronic kidney disease: Secondary | ICD-10-CM

## 2016-11-28 DIAGNOSIS — I10 Essential (primary) hypertension: Secondary | ICD-10-CM

## 2016-11-28 DIAGNOSIS — H6120 Impacted cerumen, unspecified ear: Secondary | ICD-10-CM | POA: Insufficient documentation

## 2016-11-28 MED ORDER — SPIRONOLACTONE 25 MG PO TABS: 25.0000 mg | ORAL_TABLET | Freq: Two times a day (BID) | ORAL | 1 refills | Status: DC

## 2016-11-28 MED ORDER — MONTELUKAST SODIUM 10 MG PO TABS: 10.0000 mg | ORAL_TABLET | Freq: Every day | ORAL | 1 refills | Status: DC

## 2016-11-28 MED ORDER — METOPROLOL TARTRATE 50 MG PO TABS: 50.0000 mg | ORAL_TABLET | Freq: Two times a day (BID) | ORAL | 1 refills | Status: DC

## 2016-11-28 MED ORDER — AMLODIPINE BESYLATE 10 MG PO TABS: 10.0000 mg | ORAL_TABLET | Freq: Every day | ORAL | 1 refills | Status: DC

## 2016-11-28 MED ORDER — LOSARTAN POTASSIUM 100 MG PO TABS
100.0000 mg | ORAL_TABLET | Freq: Every day | ORAL | 1 refills | Status: DC
Start: 2016-11-28 — End: 2017-09-06

## 2016-11-28 MED ORDER — AZELASTINE HCL 0.1 % NA SOLN: 2.0000 | Freq: Two times a day (BID) | NASAL | 12 refills | Status: DC

## 2016-11-28 MED ORDER — POTASSIUM CHLORIDE CRYS ER 20 MEQ PO TBCR: 20.0000 meq | EXTENDED_RELEASE_TABLET | Freq: Two times a day (BID) | ORAL | 1 refills | Status: DC

## 2016-11-28 NOTE — Assessment & Plan Note (Signed)
Improved greatly with endo care Emily Jennings is reminded of the importance of commitment to daily physical activity for 30 minutes or more, as able and the need to limit carbohydrate intake to 30 to 60 grams per meal to help with blood sugar control.   The need to take medication as prescribed, test blood sugar as directed, and to call between visits if there is a concern that blood sugar is uncontrolled is also discussed.   Emily Jennings is reminded of the importance of daily foot exam, annual eye examination, and good blood sugar, blood pressure and cholesterol control.  Diabetic Labs Latest Ref Rng & Units 10/31/2016 07/26/2016 04/06/2016 12/22/2015 09/15/2015  HbA1c <5.7 % 7.0(H) 7.2(H) 7.5(H) 7.2(H) 7.6(H)  Microalbumin Not estab mg/dL - - - - 2.2  Micro/Creat Ratio <30 mcg/mg creat - - - - 79(H)  Chol <200 mg/dL 188 - 176 - -  HDL >50 mg/dL 107 - 110 - -  Calc LDL <100 mg/dL 73 - 59 - -  Triglycerides <150 mg/dL 39 - 37 - -  Creatinine 0.60 - 0.88 mg/dL 1.48(H) 1.27(H) 1.48(H) 1.50(H) 1.16(H)   BP/Weight 11/28/2016 11/01/2016 09/28/2016 07/27/2016 07/14/2016 07/13/2016 35/10/4816  Systolic BP 590 931 121 624 469 507 225  Diastolic BP 70 60 72 66 68 78 62  Wt. (Lbs) 135 135 130.12 132 - 133 132.04  BMI 25.51 25.51 24.59 24.94 - 25.13 24.95   Foot/eye exam completion dates Latest Ref Rng & Units 01/25/2016 01/13/2016  Eye Exam No Retinopathy - No Retinopathy  Foot Form Completion - Done -

## 2016-11-28 NOTE — Progress Notes (Signed)
Emily Jennings     MRN: [id number]      DOB: 10/03/1933   HPI Emily Jennings is here for follow up and re-evaluation of chronic medical conditions, medication management and review of any available recent lab and radiology data.  Preventive health is updated, specifically  Cancer screening and Immunization.   Questions or concerns regarding consultations or procedures which the PT has had in the interim are  Addressed.Blood sugar may be a bit over corrected but is doing very well with endo, now reports a decrease in insulin dose as she has to eat to bump up her blood sugar The PT denies any adverse reactions to current medications since the last visit.  C/o drippy watery nose, clear drainage, started since past 3 weeks, though nasal spray prescribed in the Fall, not using this, only singulair, denies fever, chills or productive cough  No regular exercise, plans to change this, states she gets short of breath sometimes  ROS Denies recent fever or chills.  Denies chest congestion, productive cough or wheezing. Denies chest pains, palpitations and leg swelling Denies abdominal pain, nausea, vomiting,diarrhea or constipation.   Denies dysuria, frequency, hesitancy or incontinence. Denies joint pain, swelling and limitation in mobility. Denies headaches, seizures, numbness, or tingling. Denies depression, anxiety or insomnia. Denies skin break down or rash.   PE  BP 136/70   Pulse 65   Resp 15   Ht 5\' 1"  (1.549 m)   Wt 135 lb (61.2 kg)   SpO2 99%   BMI 25.51 kg/m   Patient alert and oriented and in no cardiopulmonary distress.  HEENT: No facial asymmetry, EOMI,   oropharynx pink and moist.  Neck supple no JVD, no mass.TM bilaterally occ;luded with cerumen. Nasal mucosa erythematous and edematous, no sinus tenderness  Chest: Clear to auscultation bilaterally.  CVS: S1, S2 no murmurs, no S3.Regular rate.  ABD: Soft non tender.   Ext: No edema  MS: Adequate ROM spine, shoulders,  hips and knees.  Skin: Intact, no ulcerations or rash noted.  Psych: Good eye contact, normal affect. Memory intact not anxious or depressed appearing.  CNS: CN 2-12 intact, power,  normal throughout.no focal deficits noted.   Assessment & Plan  Allergic rhinitis Uncontrolled symptoms with season change, re educated re need to commit to daily Astelin and continue Singulair orally  Essential hypertension Controlled, no change in medication DASH diet and commitment to daily physical activity for a minimum of 30 minutes discussed and encouraged, as a part of hypertension management. The importance of attaining a healthy weight is also discussed.  BP/Weight 11/28/2016 11/01/2016 09/28/2016 07/27/2016 07/14/2016 07/13/2016 38/09/173  Systolic BP 102 585 277 824 235 361 443  Diastolic BP 70 60 72 66 68 78 62  Wt. (Lbs) 135 135 130.12 132 - 133 132.04  BMI 25.51 25.51 24.59 24.94 - 25.13 24.95       Cerumen impaction Bilateral impaction, left worse tha right, refer to Dr Meda Coffee for irrigation  Type 2 diabetes mellitus with stage 3 chronic kidney disease ([place]) Improved greatly with endo care Emily Jennings is reminded of the importance of commitment to daily physical activity for 30 minutes or more, as able and the need to limit carbohydrate intake to 30 to 60 grams per meal to help with blood sugar control.   The need to take medication as prescribed, test blood sugar as directed, and to call between visits if there is a concern that blood sugar is uncontrolled is also discussed.  Emily Jennings is reminded of the importance of daily foot exam, annual eye examination, and good blood sugar, blood pressure and cholesterol control.  Diabetic Labs Latest Ref Rng & Units 10/31/2016 07/26/2016 04/06/2016 12/22/2015 09/15/2015  HbA1c <5.7 % 7.0(H) 7.2(H) 7.5(H) 7.2(H) 7.6(H)  Microalbumin Not estab mg/dL - - - - 2.2  Micro/Creat Ratio <30 mcg/mg creat - - - - 79(H)  Chol <200 mg/dL 188 - 176 - -  HDL >50  mg/dL 107 - 110 - -  Calc LDL <100 mg/dL 73 - 59 - -  Triglycerides <150 mg/dL 39 - 37 - -  Creatinine 0.60 - 0.88 mg/dL 1.48(H) 1.27(H) 1.48(H) 1.50(H) 1.16(H)   BP/Weight 11/28/2016 11/01/2016 09/28/2016 07/27/2016 07/14/2016 07/13/2016 65/02/8126  Systolic BP 517 001 749 449 675 916 384  Diastolic BP 70 60 72 66 68 78 62  Wt. (Lbs) 135 135 130.12 132 - 133 132.04  BMI 25.51 25.51 24.59 24.94 - 25.13 24.95   Foot/eye exam completion dates Latest Ref Rng & Units 01/25/2016 01/13/2016  Eye Exam No Retinopathy - No Retinopathy  Foot Form Completion - Done -        Carcinoma in situ of breast Mammogram in 09/2016 is normal

## 2016-11-28 NOTE — Progress Notes (Signed)
Patient seen for cerumen impaction R Ear successfully lavaged and curetted clear patient tolerated well TM clear post flush YSN

## 2016-11-28 NOTE — Assessment & Plan Note (Signed)
Mammogram in 09/2016 is normal

## 2016-11-28 NOTE — Assessment & Plan Note (Signed)
Controlled, no change in medication DASH diet and commitment to daily physical activity for a minimum of 30 minutes discussed and encouraged, as a part of hypertension management. The importance of attaining a healthy weight is also discussed.  BP/Weight 11/28/2016 11/01/2016 09/28/2016 07/27/2016 07/14/2016 07/13/2016 98/12/7306  Systolic BP 569 437 005 259 102 890 228  Diastolic BP 70 60 72 66 68 78 62  Wt. (Lbs) 135 135 130.12 132 - 133 132.04  BMI 25.51 25.51 24.59 24.94 - 25.13 24.95

## 2016-11-28 NOTE — Assessment & Plan Note (Signed)
Bilateral impaction, left worse tha right, refer to Dr Meda Coffee for irrigation

## 2016-11-28 NOTE — Assessment & Plan Note (Signed)
Uncontrolled symptoms with season change, re educated re need to commit to daily Astelin and continue Singulair orally

## 2016-11-28 NOTE — Patient Instructions (Addendum)
Physical exam as before  in May, call if you need me sooner  Please start daily nose spray, Astelin, 2 puffs twice daily, continue daily singulair for alllergies  It is important that you exercise regularly at least 30 minutes 5 times a week. If you develop chest pain, have severe difficulty breathing, or feel very tired, stop exercising immediately and seek medical attention  Please work on good  health habits so that your health will improve. 1. Commitment to daily physical activity for 30 to 60  minutes, if you are able to do this.  2. Commitment to wise food choices. Aim for half of your  food intake to be vegetable and fruit, one quarter starchy foods, and one quarter protein. Try to eat on a regular schedule  3 meals per day, snacking between meals should be limited to vegetables or fruits or small portions of nuts. 64 ounces of water per day is generally recommended, unless you have specific health conditions, like heart failure or kidney failure where you will need to limit fluid intake.  3. Commitment to sufficient and a  good quality of physical and mental rest daily, generally between 6 to 8 hours per day.  WITH PERSISTANCE AND PERSEVERANCE, THE IMPOSSIBLE , BECOMES THE NORM!

## 2016-12-19 ENCOUNTER — Encounter: Payer: Self-pay | Admitting: Podiatry

## 2016-12-19 ENCOUNTER — Ambulatory Visit (INDEPENDENT_AMBULATORY_CARE_PROVIDER_SITE_OTHER): Payer: PPO | Admitting: Podiatry

## 2016-12-19 DIAGNOSIS — M79676 Pain in unspecified toe(s): Secondary | ICD-10-CM | POA: Diagnosis not present

## 2016-12-19 DIAGNOSIS — B351 Tinea unguium: Secondary | ICD-10-CM | POA: Diagnosis not present

## 2016-12-19 DIAGNOSIS — E1149 Type 2 diabetes mellitus with other diabetic neurological complication: Secondary | ICD-10-CM

## 2016-12-19 NOTE — Progress Notes (Signed)
   Subjective:    Patient ID: Emily Jennings, female    DOB: [birth year], 83 y.o.   MRN: [id number]  HPI this patient presents the office with chief complaint of painful sore toes . She states that the nails are painful as she walks and wears her shoes. She denies any drainage or infection to the toenails. She presents the office today for an evaluation and treatment of these long painful nails    Review of Systems  HENT: Positive for sinus pressure.   Respiratory: Positive for cough.   All other systems reviewed and are negative.      Objective:   Physical Exam GENERAL APPEARANCE: Alert, conversant. Appropriately groomed. No acute distress.  VASCULAR: Pedal pulses are  palpable at  [facility] and PT bilateral.  Capillary refill time is immediate to all digits,  Normal temperature gradient.  Digital hair growth is present bilateral  NEUROLOGIC: sensation is diminished-absent  to 5.07 monofilament at 5/5 sites bilateral.  Light touch is intact bilateral, Muscle strength normal.  MUSCULOSKELETAL: acceptable muscle strength, tone and stability bilateral.  Intrinsic muscluature intact bilateral.  HAV  B/L   DERMATOLOGIC: skin color, texture, and turgor are within normal limits.  No preulcerative lesions or ulcers  are seen, no interdigital maceration noted.  No open lesions present. No drainage noted.   NAILS  Thick disfigured discolored nails both feet.            Assessment & Plan:  Onychomycosis  B/L  Diabetes with neuropathy   IE  Debridement of nails.  RTC 3 months.   Gardiner Barefoot DPM

## 2017-01-25 ENCOUNTER — Encounter: Payer: PPO | Admitting: Family Medicine

## 2017-01-25 ENCOUNTER — Encounter: Payer: Self-pay | Admitting: Family Medicine

## 2017-01-26 ENCOUNTER — Other Ambulatory Visit: Payer: Self-pay | Admitting: "Endocrinology

## 2017-01-26 DIAGNOSIS — Z794 Long term (current) use of insulin: Secondary | ICD-10-CM | POA: Diagnosis not present

## 2017-01-26 DIAGNOSIS — E1122 Type 2 diabetes mellitus with diabetic chronic kidney disease: Secondary | ICD-10-CM | POA: Diagnosis not present

## 2017-01-26 DIAGNOSIS — N183 Chronic kidney disease, stage 3 (moderate): Secondary | ICD-10-CM | POA: Diagnosis not present

## 2017-01-26 LAB — COMPREHENSIVE METABOLIC PANEL
ALBUMIN: 4 g/dL (ref 3.6–5.1)
ALT: 11 U/L (ref 6–29)
AST: 21 U/L (ref 10–35)
Alkaline Phosphatase: 59 U/L (ref 33–130)
BILIRUBIN TOTAL: 0.5 mg/dL (ref 0.2–1.2)
BUN: 34 mg/dL — ABNORMAL HIGH (ref 7–25)
CALCIUM: 9.8 mg/dL (ref 8.6–10.4)
CO2: 26 mmol/L (ref 20–31)
Chloride: 103 mmol/L (ref 98–110)
Creat: 1.53 mg/dL — ABNORMAL HIGH (ref 0.60–0.88)
Glucose, Bld: 156 mg/dL — ABNORMAL HIGH (ref 65–99)
Potassium: 5.1 mmol/L (ref 3.5–5.3)
Sodium: 138 mmol/L (ref 135–146)
Total Protein: 6.4 g/dL (ref 6.1–8.1)

## 2017-01-27 LAB — HEMOGLOBIN A1C
Hgb A1c MFr Bld: 7.3 % — ABNORMAL HIGH (ref <5.7)
MEAN PLASMA GLUCOSE: 163 mg/dL

## 2017-01-31 ENCOUNTER — Ambulatory Visit (INDEPENDENT_AMBULATORY_CARE_PROVIDER_SITE_OTHER): Payer: PPO | Admitting: "Endocrinology

## 2017-01-31 ENCOUNTER — Encounter: Payer: Self-pay | Admitting: "Endocrinology

## 2017-01-31 VITALS — BP 131/67 | HR 71 | Ht 61.0 in | Wt 136.0 lb

## 2017-01-31 DIAGNOSIS — E1122 Type 2 diabetes mellitus with diabetic chronic kidney disease: Secondary | ICD-10-CM | POA: Diagnosis not present

## 2017-01-31 DIAGNOSIS — N183 Chronic kidney disease, stage 3 unspecified: Secondary | ICD-10-CM

## 2017-01-31 DIAGNOSIS — I10 Essential (primary) hypertension: Secondary | ICD-10-CM

## 2017-01-31 DIAGNOSIS — Z794 Long term (current) use of insulin: Secondary | ICD-10-CM

## 2017-01-31 NOTE — Progress Notes (Signed)
Subjective:    Patient ID: Emily Jennings, female    DOB: 10-14-1933,    Past Medical History:  Diagnosis Date  . Allergy   . Anxiety   . Arthritis   . Cancer [facility]) 2009   breast, carcinoma in situ left  . Carotid stenosis    11/16/2005  mild plaque formation and stenosis proximal right ECA  . Cataract   . Complication of anesthesia   . Coronary artery disease    cardiac catheterization on 03/20/2006  LAD mid 40% stenosis, left circumflex mild 40% stenosis, RCA mid-vessel 40% to 50% lesion   EF 60%  . Diabetes mellitus   . GERD (gastroesophageal reflux disease)   . Hernia, inguinal    left  . Hyperglycemia   . Hypertension   . Low blood potassium   . Non-insulin dependent type 2 diabetes mellitus ([place])   . Osteoporosis   . Shortness of breath    2D Echocardiogram 01/26/2009   EF of greater than 55%, mild MR, mild TR, normal ventricular function  . Ventricular tachycardia, non-sustained ([facility])    developed during stress test 02/08/2006, spontaneously aborted, mild reversible apical defect   Past Surgical History:  Procedure Laterality Date  . BREAST LUMPECTOMY     Left breast 2009  . CATARACT EXTRACTION W/PHACO Left 10/28/2014   Procedure: PHACO EMULSION CATARACT EXTRACTION WITH INTRAOCULAR LENS IMPLANT LEFT EYE (IOC);  Surgeon: Marylynn Pearson, MD;  Location: [place];  Service: Ophthalmology;  Laterality: Left;  . COLONOSCOPY    . cyst removed from left foot     Social History   Social History  . Marital status: Married    Spouse name: N/A  . Number of children: N/A  . Years of education: na   Occupational History  . retired Retired   Social History Main Topics  . Smoking status: Never Smoker  . Smokeless tobacco: Never Used  . Alcohol use No  . Drug use: No  . Sexual activity: Not Currently    Birth control/ protection: Post-menopausal   Other Topics Concern  . None   Social History Narrative  . None   Outpatient Encounter Prescriptions as of 01/31/2017   Medication Sig  . acetaminophen (TYLENOL) 500 MG tablet Take 500 mg by mouth every 6 (six) hours as needed.  Marland Kitchen albuterol (PROVENTIL HFA;VENTOLIN HFA) 108 (90 Base) MCG/ACT inhaler Inhale 1-2 puffs into the lungs every 6 (six) hours as needed for wheezing or shortness of breath.  Marland Kitchen alendronate (FOSAMAX) 70 MG tablet TAKE 1 TABLET BY MOUTH EVERY 7 DAYS. TAKE WITH A FULL GLASS OF WATER ON AN EMPTY STOMACH  . amLODipine (NORVASC) 10 MG tablet Take 1 tablet (10 mg total) by mouth daily.  Marland Kitchen azelastine (ASTELIN) 0.1 % nasal spray Place 2 sprays into both nostrils 2 (two) times daily. Use in each nostril as directed  . B-D INS SYRINGE 0.5CC/31GX5/16 31G X 5/16" 0.5 ML MISC 1 EACH BY DOES NOT APPLY ROUTE 2 (TWO) TIMES DAILY.  Marland Kitchen B-D ULTRAFINE III SHORT PEN 31G X 8 MM MISC USE AS DIRECTED  . blood glucose meter kit and supplies KIT One touch Ultra. Use up to two times daily as directed. (FOR ICD E11.65)  . calcium-vitamin D (OSCAL WITH D) 500-200 MG-UNIT per tablet Take 1 tablet by mouth daily with breakfast.   . loperamide (ANTI-DIARRHEAL) 2 MG tablet Take 4 mg by mouth as needed for diarrhea or loose stools.   Marland Kitchen loratadine (ALLERGY RELIEF) 10  MG tablet Take 10 mg by mouth daily as needed for allergies.   Marland Kitchen losartan (COZAAR) 100 MG tablet Take 1 tablet (100 mg total) by mouth daily.  . metoprolol (LOPRESSOR) 50 MG tablet Take 1 tablet (50 mg total) by mouth 2 (two) times daily.  . montelukast (SINGULAIR) 10 MG tablet Take 1 tablet (10 mg total) by mouth at bedtime.  Marland Kitchen NOVOLOG MIX 70/30 (70-30) 100 UNIT/ML injection INJECT 15 UNITS WITH BREAKFAST AND 10 UNITS WITH SUPPER WHEN BLOOD GLUCOSE IS ABOVE 90.  Marland Kitchen ONE TOUCH ULTRA TEST test strip USE AS INSTRUCTED 4 X DAILY. E11.65  . ONETOUCH DELICA LANCETS FINE MISC USE TO OBTAIN A BLOOD SPECIMEN 2 TIMES A DAY  . potassium chloride SA (KLOR-CON M20) 20 MEQ tablet Take 1 tablet (20 mEq total) by mouth 2 (two) times daily.  Marland Kitchen spironolactone (ALDACTONE) 25 MG tablet  Take 1 tablet (25 mg total) by mouth 2 (two) times daily.  . [DISCONTINUED] ONE TOUCH ULTRA TEST test strip USE AS INSTRUCTED 4 X DAILY. E11.65   No facility-administered encounter medications on file as of 01/31/2017.    ALLERGIES: Allergies  Allergen Reactions  . Benadryl [Diphenhydramine Hcl] Hypertension  . Citalopram     Pt unsure  . Metformin And Related Diarrhea  . Tramadol Other (See Comments)    Felt light headed and dizzy  . Tylenol [Acetaminophen] Other (See Comments)    Patient stated tylenol makes her "feel high"   VACCINATION STATUS: Immunization History  Administered Date(s) Administered  . Influenza Split 07/17/2011  . Influenza Whole 06/18/2007, 07/29/2009, 05/16/2013  . Influenza,inj,Quad PF,36+ Mos 05/19/2014, 08/22/2016  . Pneumococcal Conjugate-13 03/24/2014  . Pneumococcal Polysaccharide-23 05/20/2008  . Td 06/18/2002  . Tdap 07/17/2011    Diabetes  She presents for her follow-up diabetic visit. She has type 2 diabetes mellitus. Onset time: She was diagnosed at approximate age of 30 years. Her disease course has been stable. There are no hypoglycemic associated symptoms. Pertinent negatives for hypoglycemia include no confusion, headaches, pallor or seizures. There are no diabetic associated symptoms. Pertinent negatives for diabetes include no chest pain, no fatigue, no polydipsia, no polyphagia and no polyuria. There are no hypoglycemic complications. Symptoms are stable. Diabetic complications include nephropathy. Risk factors for coronary artery disease include diabetes mellitus, dyslipidemia, hypertension and sedentary lifestyle. Current diabetic treatment includes insulin injections. She is compliant with treatment some of the time. Her weight is stable. She is following a generally unhealthy diet. She has had a previous visit with a dietitian. She never participates in exercise. Home blood sugar record trend: She came with better blood glucose profile, A1c  7.5%. She still is making  some insulin dosing errors covering her meals even when pre-meal blood glucose is below 90 mg/dL. Her breakfast blood glucose range is generally 140-180 mg/dl. Her lunch blood glucose range is generally 140-180 mg/dl. Her dinner blood glucose range is generally 140-180 mg/dl. Her overall blood glucose range is 140-180 mg/dl. An ACE inhibitor/angiotensin II receptor blocker is being taken.  Hyperlipidemia  This is a chronic problem. The current episode started more than 1 year ago. Pertinent negatives include no chest pain, myalgias or shortness of breath. Risk factors for coronary artery disease include diabetes mellitus, dyslipidemia and a sedentary lifestyle.  Hypertension  This is a chronic problem. The current episode started more than 1 year ago. Pertinent negatives include no chest pain, headaches, palpitations or shortness of breath. Risk factors for coronary artery disease include diabetes mellitus, dyslipidemia  and sedentary lifestyle. Past treatments include angiotensin blockers.      Review of Systems  Constitutional: Negative for fatigue and unexpected weight change.  HENT: Negative for trouble swallowing and voice change.   Eyes: Negative for visual disturbance.  Respiratory: Negative for cough, shortness of breath and wheezing.   Cardiovascular: Negative for chest pain, palpitations and leg swelling.  Gastrointestinal: Negative for diarrhea, nausea and vomiting.  Endocrine: Negative for cold intolerance, heat intolerance, polydipsia, polyphagia and polyuria.  Musculoskeletal: Negative for arthralgias and myalgias.  Skin: Negative for color change, pallor, rash and wound.  Neurological: Negative for seizures and headaches.  Psychiatric/Behavioral: Negative for confusion and suicidal ideas.    Objective:    BP 131/67   Pulse 71   Ht 5' 1"  (1.549 m)   Wt 136 lb (61.7 kg)   BMI 25.70 kg/m   Wt Readings from Last 3 Encounters:  01/31/17 136 lb (61.7  kg)  11/28/16 135 lb (61.2 kg)  11/01/16 135 lb (61.2 kg)    Physical Exam  Constitutional: She is oriented to person, place, and time. She appears well-developed.  HENT:  Head: Normocephalic and atraumatic.  Eyes: EOM are normal.  Neck: Normal range of motion. Neck supple. No tracheal deviation present. No thyromegaly present.  Cardiovascular: Normal rate and regular rhythm.   Pulmonary/Chest: Effort normal and breath sounds normal.  Abdominal: Soft. Bowel sounds are normal. There is no tenderness. There is no guarding.  Musculoskeletal: Normal range of motion. She exhibits no edema.  Neurological: She is alert and oriented to person, place, and time. She has normal reflexes. No cranial nerve deficit. Coordination normal.  Skin: Skin is warm and dry. No rash noted. No erythema. No pallor.  Psychiatric: She has a normal mood and affect. Judgment normal.    Results for orders placed or performed in visit on 01/26/17  Comprehensive metabolic panel  Result Value Ref Range   Sodium 138 135 - 146 mmol/L   Potassium 5.1 3.5 - 5.3 mmol/L   Chloride 103 98 - 110 mmol/L   CO2 26 20 - 31 mmol/L   Glucose, Bld 156 (H) 65 - 99 mg/dL   BUN 34 (H) 7 - 25 mg/dL   Creat 1.53 (H) 0.60 - 0.88 mg/dL   Total Bilirubin 0.5 0.2 - 1.2 mg/dL   Alkaline Phosphatase 59 33 - 130 U/L   AST 21 10 - 35 U/L   ALT 11 6 - 29 U/L   Total Protein 6.4 6.1 - 8.1 g/dL   Albumin 4.0 3.6 - 5.1 g/dL   Calcium 9.8 8.6 - 10.4 mg/dL  Hemoglobin A1c  Result Value Ref Range   Hgb A1c MFr Bld 7.3 (H) <5.7 %   Mean Plasma Glucose 163 mg/dL   Complete Blood Count (Most recent): Lab Results  Component Value Date   WBC 4.8 10/31/2016   HGB 12.5 10/31/2016   HCT 39.0 10/31/2016   MCV 93.5 10/31/2016   PLT 250 10/31/2016   Chemistry (most recent): Lab Results  Component Value Date   NA 138 01/26/2017   K 5.1 01/26/2017   CL 103 01/26/2017   CO2 26 01/26/2017   BUN 34 (H) 01/26/2017   CREATININE 1.53 (H)  01/26/2017   Diabetic Labs (most recent): Lab Results  Component Value Date   HGBA1C 7.3 (H) 01/26/2017   HGBA1C 7.0 (H) 10/31/2016   HGBA1C 7.2 (H) 07/26/2016   Lipid profile (most recent): Lab Results  Component Value Date   TRIG 39  10/31/2016   CHOL 188 10/31/2016     Assessment & Plan:   1. Type 2 diabetes mellitus with stage 3 chronic kidney disease, with long-term current use of insulin   Patient came with Improving blood glucose profile .  No major hypoglycemic, her recent A1c is Stable at 7.3 %, generally improved from  9.2 %. Recent labs reviewed.  Patient remains at a high risk for more acute and chronic complications of diabetes which include CAD, CVA, CKD, retinopathy, and neuropathy. These are all discussed in detail with the patient. I have re-counseled the patient on diet management and weight loss, by adopting a carbohydrate restricted / protein rich  Diet. Patient is advised to stick to a routine mealtimes to eat 3 meals  a day and avoid unnecessary snacks ( to snack only to correct hypoglycemia). Patient is advised to eliminate simple carbs  from their diet including cakes desserts ice cream soda (  diet and regular) , sweet tea , Candies,  chips and cookies, artificial sweeteners,   and "sugar-free" products .  This will help patient to have stable blood glucose profile and potentially lose weight.  The patient  Has been scheduled with Jearld Fenton, RDN, CDE for individualized DM education. I have approached patient to continue on  intensive monitoring of blood glucose and insulin therapy, and patient agrees. She would need basal bolus insulin.  However she cannot follow optimal insulin instructions for safe use.  - I had a long discussion with her specifically to avoid injecting insulin when pre-meal blood glucose levels are below 90.   - For now , I will continue with  premixed  insulin NovoLog 70/30  15 units  only with breakfast when pre-meal glucose is  above 90 mg/dL . She is advised to continue  strict monitoring of blood glucose before meals and at bedtime.  -Patient is encouraged to call clinic for blood glucose levels less than 70 or above 300 mg /dl.  -Target numbers for A1c, LDL, HDL, Triglycerides, Waist Circumference were discussed in detail.  2) HTN: controlled. Continue current medications including ACEI. 3. Lipids: Controlled, LDL 73 . She is not on statins.  4)  Weight/Diet: CDE consult in progress, exercise, and carbs information provided.   4) Chronic Care: -Patient is on ARB  medications and encouraged to continue to follow up with Ophthalmology, Podiatrist at least yearly or according to recommendations, and advised to stay away from smoking. I have recommended yearly flu vaccine and pneumonia vaccination at least every 5 years; moderate intensity exercise for up to 150 minutes weekly; and  sleep for at least 7 hours a day.  Patient to bring meter and  blood glucose logs during their next visit.  Follow up plan: Return in about 3 months (around 05/03/2017) for follow up with pre-visit labs, meter, and logs.  Glade Lloyd, MD Phone: [phone]  Fax: [phone]   01/31/2017, 3:29 PM

## 2017-02-21 ENCOUNTER — Telehealth: Payer: Self-pay | Admitting: Family Medicine

## 2017-02-21 NOTE — Telephone Encounter (Signed)
FYI:  Patient left message on voice mail 9:57 stating someone called her from our office and told her to call back, but she didn't know who.

## 2017-03-01 ENCOUNTER — Other Ambulatory Visit: Payer: Self-pay | Admitting: "Endocrinology

## 2017-03-02 ENCOUNTER — Other Ambulatory Visit: Payer: Self-pay | Admitting: Family Medicine

## 2017-03-19 ENCOUNTER — Ambulatory Visit (INDEPENDENT_AMBULATORY_CARE_PROVIDER_SITE_OTHER): Payer: PPO | Admitting: Family Medicine

## 2017-03-19 ENCOUNTER — Encounter: Payer: Self-pay | Admitting: Family Medicine

## 2017-03-19 VITALS — BP 130/60 | HR 75 | Temp 98.9°F | Resp 14 | Ht 61.0 in | Wt 137.0 lb

## 2017-03-19 DIAGNOSIS — Z0000 Encounter for general adult medical examination without abnormal findings: Secondary | ICD-10-CM | POA: Diagnosis not present

## 2017-03-19 DIAGNOSIS — E1021 Type 1 diabetes mellitus with diabetic nephropathy: Secondary | ICD-10-CM | POA: Diagnosis not present

## 2017-03-19 NOTE — Assessment & Plan Note (Signed)
Emily Jennings is reminded of the importance of commitment to daily physical activity for 30 minutes or more, as able and the need to limit carbohydrate intake to 30 to 60 grams per meal to help with blood sugar control.   The need to take medication as prescribed, test blood sugar as directed, and to call between visits if there is a concern that blood sugar is uncontrolled is also discussed.   Emily Jennings is reminded of the importance of daily foot exam, annual eye examination, and good blood sugar, blood pressure and cholesterol control.  Diabetic Labs Latest Ref Rng & Units 01/26/2017 10/31/2016 07/26/2016 04/06/2016 12/22/2015  HbA1c <5.7 % 7.3(H) 7.0(H) 7.2(H) 7.5(H) 7.2(H)  Microalbumin Not estab mg/dL - - - - -  Micro/Creat Ratio <30 mcg/mg creat - - - - -  Chol <200 mg/dL - 188 - 176 -  HDL >50 mg/dL - 107 - 110 -  Calc LDL <100 mg/dL - 73 - 59 -  Triglycerides <150 mg/dL - 39 - 37 -  Creatinine 0.60 - 0.88 mg/dL 1.53(H) 1.48(H) 1.27(H) 1.48(H) 1.50(H)   BP/Weight 03/19/2017 01/31/2017 11/28/2016 11/01/2016 09/28/2016 07/27/2016 97/47/1855  Systolic BP 015 868 257 493 552 174 715  Diastolic BP 60 67 70 60 72 66 68  Wt. (Lbs) 137 136 135 135 130.12 132 -  BMI 25.89 25.7 25.51 25.51 24.59 24.94 -   Foot/eye exam completion dates Latest Ref Rng & Units 01/25/2016 01/13/2016  Eye Exam No Retinopathy - No Retinopathy  Foot Form Completion - Done -

## 2017-03-19 NOTE — Patient Instructions (Signed)
F/u in 4.5 month, call if you needme before  Congrats on excellent blood pressure and blood sugar  Stop singulair since not helping  It is important that you exercise regularly at least 30 minutes 5 times a week. If you develop chest pain, have severe difficulty breathing, or feel very tired, stop exercising immediately and seek medical attention   Please be careful not to fall  Thank you  for choosing [facility]. We consider it a privelige to serve you.  Delivering excellent health care in a caring and  compassionate way is our goal.  Partnering with you,  so that together we can achieve this goal is our strategy.

## 2017-03-19 NOTE — Assessment & Plan Note (Signed)

## 2017-03-20 ENCOUNTER — Ambulatory Visit (INDEPENDENT_AMBULATORY_CARE_PROVIDER_SITE_OTHER): Payer: PPO | Admitting: Podiatry

## 2017-03-20 DIAGNOSIS — M79676 Pain in unspecified toe(s): Secondary | ICD-10-CM

## 2017-03-20 DIAGNOSIS — B351 Tinea unguium: Secondary | ICD-10-CM

## 2017-03-20 DIAGNOSIS — E1149 Type 2 diabetes mellitus with other diabetic neurological complication: Secondary | ICD-10-CM

## 2017-03-20 NOTE — Progress Notes (Signed)
   Subjective:    Patient ID: Emily Jennings, female    DOB: 1933/10/05, 83 y.o.   MRN: [id number]  HPI this patient presents the office with chief complaint of painful sore toes . She states that the nails are painful as she walks and wears her shoes. She denies any drainage or infection to the toenails. She presents the office today for an evaluation and treatment of these long painful nails    Review of Systems  HENT: Positive for sinus pressure.   Respiratory: Positive for cough.   All other systems reviewed and are negative.      Objective:   Physical Exam GENERAL APPEARANCE: Alert, conversant. Appropriately groomed. No acute distress.  VASCULAR: Pedal pulses are  palpable at  [facility] and PT bilateral.  Capillary refill time is immediate to all digits,  Normal temperature gradient.  Digital hair growth is present bilateral  NEUROLOGIC: sensation is diminished-absent  to 5.07 monofilament at 5/5 sites bilateral.  Light touch is intact bilateral, Muscle strength normal.  MUSCULOSKELETAL: acceptable muscle strength, tone and stability bilateral.  Intrinsic muscluature intact bilateral.  HAV  B/L   DERMATOLOGIC: skin color, texture, and turgor are within normal limits.  No preulcerative lesions or ulcers  are seen, no interdigital maceration noted.  No open lesions present. No drainage noted.   NAILS  Thick disfigured discolored nails both feet.            Assessment & Plan:  Onychomycosis  B/L  Diabetes with neuropathy   IE  Debridement of nails.  RTC 3 months.   Gardiner Barefoot DPM

## 2017-03-24 ENCOUNTER — Encounter: Payer: Self-pay | Admitting: Family Medicine

## 2017-03-24 NOTE — Progress Notes (Signed)
Emily Jennings     MRN: [id number]      DOB: 11/06/1933  HPI: Patient is in for annual physical exam. No other health concerns are expressed or addressed at the visit. Recent labs, if available are reviewed. Immunization is reviewed , and  updated if needed.   PE: BP 130/60 (BP Location: Left Arm, Patient Position: Sitting)   Pulse 75   Temp 98.9 F (37.2 C)   Wt 137 lb (62.1 kg)   SpO2 95%   BMI 25.89 kg/m   Pleasant  female, alert and oriented x 3, in no cardio-pulmonary distress. Afebrile. HEENT No facial trauma or asymetry. Sinuses non tender.  Extra occullar muscles intact, pupils equally reactive to light. External ears normal, tympanic membranes clear. Oropharynx moist, no exudate. Neck: supple, no adenopathy,JVD or thyromegaly.No bruits.  Chest: Clear to ascultation bilaterally.No crackles or wheezes. Non tender to palpation  Breast: Minor asymet due to left lumpectomy,no masses or lumps. No tenderness. No nipple discharge or inversion. No axillary or supraclavicular adenopathy  Cardiovascular system; Heart sounds normal,  S1 and  S2 ,no S3.  No murmur, or thrill. Apical beat not displaced Peripheral pulses normal.  Abdomen: Soft, non tender, no organomegaly or masses. No bruits. Bowel sounds normal. No guarding, tenderness or rebound.  Rectal:  Normal sphincter tone. No rectal mass. Guaiac negative stool.  GU: External genitalia normal female genitalia , normal female distribution of hair. No lesions. Urethral meatus normal in size, no  Prolapse, no lesions visibly  Present. Bladder non tender. Vagina pink and moist , with no visible lesions , discharge present . Adequate pelvic support no  cystocele or rectocele noted Cervix pink and appears healthy, no lesions or ulcerations noted, no discharge noted from os Uterus enlarged , no adnexal masses, no cervical motion or adnexal tenderness.   Musculoskeletal exam: Full ROM of spine, hips ,  shoulders and knees. No deformity ,swelling or crepitus noted. No muscle wasting or atrophy.   Neurologic: Cranial nerves 2 to 12 intact. Power, tone ,sensation and reflexes normal throughout. No disturbance in gait. No tremor.  Skin: Intact, no ulceration, erythema , scaling or rash noted. Pigmentation normal throughout  Psych; Normal mood and affect. Judgement and concentration normal   Assessment & Plan:  Annual physical exam Annual exam as documented. Counseling done  re healthy lifestyle involving commitment to 150 minutes exercise per week, heart healthy diet, and attaining healthy weight.The importance of adequate sleep also discussed. Regular seat belt use and home safety, is also discussed. Changes in health habits are decided on by the patient with goals and time frames  set for achieving them. Immunization and cancer screening needs are specifically addressed at this visit.   Type 2 diabetes mellitus with stage 3 chronic kidney disease ([place]) Ms. Denunzio is reminded of the importance of commitment to daily physical activity for 30 minutes or more, as able and the need to limit carbohydrate intake to 30 to 60 grams per meal to help with blood sugar control.   The need to take medication as prescribed, test blood sugar as directed, and to call between visits if there is a concern that blood sugar is uncontrolled is also discussed.   Ms. Barsch is reminded of the importance of daily foot exam, annual eye examination, and good blood sugar, blood pressure and cholesterol control.  Diabetic Labs Latest Ref Rng & Units 01/26/2017 10/31/2016 07/26/2016 04/06/2016 12/22/2015  HbA1c <5.7 % 7.3(H) 7.0(H) 7.2(H) 7.5(H) 7.2(H)  Microalbumin Not estab mg/dL - - - - -  Micro/Creat Ratio <30 mcg/mg creat - - - - -  Chol <200 mg/dL - 188 - 176 -  HDL >50 mg/dL - 107 - 110 -  Calc LDL <100 mg/dL - 73 - 59 -  Triglycerides <150 mg/dL - 39 - 37 -  Creatinine 0.60 - 0.88 mg/dL 1.53(H) 1.48(H)  1.27(H) 1.48(H) 1.50(H)   BP/Weight 03/19/2017 01/31/2017 11/28/2016 11/01/2016 09/28/2016 07/27/2016 87/68/1157  Systolic BP 262 035 597 416 384 536 468  Diastolic BP 60 67 70 60 72 66 68  Wt. (Lbs) 137 136 135 135 130.12 132 -  BMI 25.89 25.7 25.51 25.51 24.59 24.94 -   Foot/eye exam completion dates Latest Ref Rng & Units 01/25/2016 01/13/2016  Eye Exam No Retinopathy - No Retinopathy  Foot Form Completion - Done -

## 2017-03-26 ENCOUNTER — Telehealth: Payer: Self-pay | Admitting: Family Medicine

## 2017-03-26 LAB — HEMOCCULT GUIAC POC 1CARD (OFFICE): FECAL OCCULT BLD: NEGATIVE

## 2017-03-26 NOTE — Addendum Note (Signed)
Addended by: Merceda Elks on: 03/26/2017 08:01 AM   Modules accepted: Orders

## 2017-03-26 NOTE — Telephone Encounter (Signed)
Pt called and lvm on nurse line stating to give [place] a call to discuss about her problem.  Please f/u

## 2017-03-28 NOTE — Telephone Encounter (Signed)
Called patient and left message for them to return call at the office   

## 2017-03-28 NOTE — Telephone Encounter (Signed)
Left voicemail returning call

## 2017-03-29 NOTE — Telephone Encounter (Signed)
Pt c/o small wound on ankle that is drainaing. Advised to call first thing at 8am to be scheduled for a same day appt with dr Meda Coffee

## 2017-03-30 ENCOUNTER — Encounter: Payer: Self-pay | Admitting: Family Medicine

## 2017-03-30 ENCOUNTER — Ambulatory Visit (INDEPENDENT_AMBULATORY_CARE_PROVIDER_SITE_OTHER): Payer: PPO | Admitting: Family Medicine

## 2017-03-30 VITALS — BP 118/68 | HR 72 | Temp 98.2°F | Resp 16 | Ht 61.0 in | Wt 135.1 lb

## 2017-03-30 DIAGNOSIS — S81802A Unspecified open wound, left lower leg, initial encounter: Secondary | ICD-10-CM

## 2017-03-30 MED ORDER — SULFAMETHOXAZOLE-TRIMETHOPRIM 800-160 MG PO TABS: 1.0000 | ORAL_TABLET | Freq: Two times a day (BID) | ORAL | 0 refills | Status: DC

## 2017-03-30 NOTE — Patient Instructions (Signed)
Keep clean and dry Take antibiotic twice a day for 5 days Come in if this does not help resolve the wound/drainage

## 2017-03-30 NOTE — Progress Notes (Signed)
Chief Complaint  Patient presents with  . Joint Swelling    x 2 months  here for infected wound Open area L ankle for a couple of weeks "Top came off and pus came out" Now red, sore, swollen and she is worried about diabetic wound infection No fever Sugar controlled   Patient Active Problem List   Diagnosis Date Noted  . Annual physical exam 01/25/2016  . Osteoporosis 07/28/2014  . Uterine fibroid 07/18/2014  . Lump in the groin 06/26/2014  . Diabetic hypertension-nephrosis syndrome ([place]) 03/23/2014  . CKD (chronic kidney disease) stage 3, GFR 30-59 ml/min 03/23/2014  . Type 2 diabetes mellitus with stage 3 chronic kidney disease ([place]) 09/29/2013  . Allergic rhinitis 01/27/2013  . GERD (gastroesophageal reflux disease) 01/12/2013  . Insomnia 11/16/2011  . CVD (cardiovascular disease) 07/17/2011  . Carcinoma in situ of breast 05/21/2008  . ANXIETY DISORDER, GENERALIZED 07/15/2007  . Essential hypertension 07/15/2007    Outpatient Encounter Prescriptions as of 03/30/2017  Medication Sig  . acetaminophen (TYLENOL) 500 MG tablet Take 500 mg by mouth every 6 (six) hours as needed.  Marland Kitchen albuterol (PROVENTIL HFA;VENTOLIN HFA) 108 (90 Base) MCG/ACT inhaler Inhale 1-2 puffs into the lungs every 6 (six) hours as needed for wheezing or shortness of breath.  Marland Kitchen alendronate (FOSAMAX) 70 MG tablet TAKE 1 TABLET BY MOUTH EVERY 7 DAYS. TAKE WITH A FULL GLASS OF WATER ON AN EMPTY STOMACH  . amLODipine (NORVASC) 10 MG tablet Take 1 tablet (10 mg total) by mouth daily.  Marland Kitchen azelastine (ASTELIN) 0.1 % nasal spray Place 2 sprays into both nostrils 2 (two) times daily. Use in each nostril as directed  . B-D INS SYRINGE 0.5CC/31GX5/16 31G X 5/16" 0.5 ML MISC 1 EACH BY DOES NOT APPLY ROUTE 2 (TWO) TIMES DAILY.  Marland Kitchen B-D ULTRAFINE III SHORT PEN 31G X 8 MM MISC USE AS DIRECTED  . blood glucose meter kit and supplies KIT One touch Ultra. Use up to two times daily as directed. (FOR ICD E11.65)  .  calcium-vitamin D (OSCAL WITH D) 500-200 MG-UNIT per tablet Take 1 tablet by mouth daily with breakfast.   . loperamide (ANTI-DIARRHEAL) 2 MG tablet Take 4 mg by mouth as needed for diarrhea or loose stools.   Marland Kitchen loratadine (ALLERGY RELIEF) 10 MG tablet Take 10 mg by mouth daily as needed for allergies.   Marland Kitchen losartan (COZAAR) 100 MG tablet Take 1 tablet (100 mg total) by mouth daily.  . metoprolol (LOPRESSOR) 50 MG tablet Take 1 tablet (50 mg total) by mouth 2 (two) times daily.  . Multiple Vitamins-Minerals (MULTIVITAMIN WITH IRON-MINERALS) liquid Take by mouth daily.  Marland Kitchen NOVOLOG MIX 70/30 (70-30) 100 UNIT/ML injection INJECT 15 UNITS WITH BREAKFAST AND 10 UNITS WITH SUPPER WHEN BLOOD GLUCOSE IS ABOVE 90.  . omega-3 acid ethyl esters (LOVAZA) 1 g capsule Take by mouth 2 (two) times daily.  . ONE TOUCH ULTRA TEST test strip USE AS INSTRUCTED 4 X DAILY. E11.65  . ONETOUCH DELICA LANCETS 29J MISC USE TO OBTAIN A BLOOD SPECIMEN 2 TIMES A DAY  . potassium chloride SA (KLOR-CON M20) 20 MEQ tablet Take 1 tablet (20 mEq total) by mouth 2 (two) times daily.  Marland Kitchen spironolactone (ALDACTONE) 25 MG tablet Take 1 tablet (25 mg total) by mouth 2 (two) times daily.  Marland Kitchen sulfamethoxazole-trimethoprim (BACTRIM DS,SEPTRA DS) 800-160 MG tablet Take 1 tablet by mouth 2 (two) times daily.   No facility-administered encounter medications on file as of 03/30/2017.  Allergies  Allergen Reactions  . Benadryl [Diphenhydramine Hcl] Hypertension  . Citalopram     Pt unsure  . Metformin And Related Diarrhea  . Tramadol Other (See Comments)    Felt light headed and dizzy  . Tylenol [Acetaminophen] Other (See Comments)    Patient stated tylenol makes her "feel high"    Review of Systems  Constitutional: Negative for fatigue and fever.  Endocrine: Negative for polydipsia and polyuria.  Musculoskeletal: Negative for myalgias.  Skin: Positive for color change and wound.  All other systems reviewed and are  negative.   BP 118/68 (BP Location: Right Arm, Patient Position: Sitting, Cuff Size: Normal)   Pulse 72   Temp 98.2 F (36.8 C) (Temporal)   Resp 16   Ht 5' 1" (1.549 m)   Wt 135 lb 1.3 oz (61.3 kg)   SpO2 94%   BMI 25.52 kg/m   Physical Exam  Constitutional: She is oriented to person, place, and time. She appears well-developed and well-nourished. No distress.  HENT:  Head: Normocephalic and atraumatic.  Mouth/Throat: Oropharynx is clear and moist.  Neurological: She is alert and oriented to person, place, and time.  Skin: Rash noted.  Left ankle above med malleolus there is a 1 cm shallow ulcer with yellow center - no drainage.  Surrounding erythema.  Mild soft tissue swelling.  Psychiatric: She has a normal mood and affect. Her behavior is normal.    ASSESSMENT/PLAN:  1. Wound of left lower extremity, initial encounter Mild early infection.  Wound care discussed   Patient Instructions  Keep clean and dry Take antibiotic twice a day for 5 days Come in if this does not help resolve the wound/drainage   Yvonne Sue Nelson, MD 

## 2017-04-10 ENCOUNTER — Other Ambulatory Visit: Payer: Self-pay | Admitting: "Endocrinology

## 2017-04-30 ENCOUNTER — Encounter: Payer: Self-pay | Admitting: Family Medicine

## 2017-04-30 ENCOUNTER — Ambulatory Visit (INDEPENDENT_AMBULATORY_CARE_PROVIDER_SITE_OTHER): Payer: PPO | Admitting: Family Medicine

## 2017-04-30 VITALS — BP 120/64 | HR 66 | Temp 98.9°F | Resp 16 | Ht 61.0 in | Wt 133.2 lb

## 2017-04-30 DIAGNOSIS — I10 Essential (primary) hypertension: Secondary | ICD-10-CM

## 2017-04-30 DIAGNOSIS — J0100 Acute maxillary sinusitis, unspecified: Secondary | ICD-10-CM | POA: Diagnosis not present

## 2017-04-30 MED ORDER — AZITHROMYCIN 250 MG PO TABS: ORAL_TABLET | ORAL | 0 refills | Status: DC

## 2017-04-30 MED ORDER — BENZONATATE 100 MG PO CAPS: 100.0000 mg | ORAL_CAPSULE | Freq: Two times a day (BID) | ORAL | 0 refills | Status: DC | PRN

## 2017-04-30 NOTE — Progress Notes (Signed)
   Emily Jennings     MRN: [id number]      DOB: 01/30/1934   HPI Emily Jennings 3 day  h/o worsening head and chest congestion, associated with chills intermittently. Nasal drainage has thickened , and is yellowish ,she is coughing at times. Denies  bilateral ear pressure, denies hearing loss and sore throat.  No improvement with OTC medication.    ROS See HPI  Denies chest pains, palpitations and leg swelling Denies abdominal pain, nausea, vomiting,diarrhea or constipation.   Denies dysuria, frequency, hesitancy or incontinence. Denies joint pain, swelling and limitation in mobility. Denies headaches, seizures, numbness, or tingling. Denies depression, anxiety or insomnia. Denies skin break down or rash.   PE  BP 120/64 (BP Location: Right Arm, Patient Position: Sitting, Cuff Size: Normal)   Pulse 66   Temp 98.9 F (37.2 C) (Other (Comment))   Resp 16   Ht 5\' 1"  (1.549 m)   Wt 133 lb 4 oz (60.4 kg)   SpO2 98%   BMI 25.18 kg/m   Patient alert and oriented and in no cardiopulmonary distress.  HEENT: No facial asymmetry, EOMI,   oropharynx pink and moist.  Neck supple no JVD, no mass.Maxillary sinus tenderness, TM clear Chest: Clear to auscultation bilaterally.  CVS: S1, S2 no murmurs, no S3.Regular rate.  ABD: Soft non tender.   Ext: No edema  MS: Adequate ROM spine, shoulders, hips and knees.  Skin: Intact, no ulcerations or rash noted.  Psych: Good eye contact, normal affect. Memory intact not anxious or depressed appearing.  CNS: CN 2-12 intact, power,  normal throughout.no focal deficits noted.   Assessment & Plan  Maxillary sinusitis, acute Z pack and tessalon perels prescribed  Essential hypertension Controlled, no change in medication   Type 2 diabetes mellitus with stage 3 chronic kidney disease ([place]) controlld and managed by endo

## 2017-04-30 NOTE — Patient Instructions (Signed)
F/u as before, call if you need me sooner  Medication is sent in for sinusitis , azithromycin adn tessalon perles , to [facility]  Please drink a lot of water and get rest, tell grandson no cooking this week,sorry, grandma has to get well!

## 2017-05-01 NOTE — Assessment & Plan Note (Signed)
controlld and managed by endo

## 2017-05-01 NOTE — Assessment & Plan Note (Signed)
Controlled, no change in medication  

## 2017-05-01 NOTE — Assessment & Plan Note (Signed)
Z pack and tessalon perels prescribed

## 2017-05-03 ENCOUNTER — Ambulatory Visit: Payer: PPO | Admitting: "Endocrinology

## 2017-05-04 ENCOUNTER — Encounter: Payer: Self-pay | Admitting: "Endocrinology

## 2017-05-14 ENCOUNTER — Telehealth: Payer: Self-pay | Admitting: *Deleted

## 2017-05-14 NOTE — Telephone Encounter (Signed)
I tried to call patient to schedule no answer left a message.

## 2017-05-14 NOTE — Telephone Encounter (Signed)
Patient called left message returning my call. When can patient be seen?

## 2017-05-14 NOTE — Telephone Encounter (Signed)
Patient called left message stating she hurt her leg and needs to see Dr Moshe Cipro, I tried to call patient back to get more information, no answer left message. Please advise

## 2017-05-14 NOTE — Telephone Encounter (Signed)
Can see Dr Meda Coffee this pm in open slot. Please notify patient

## 2017-05-15 NOTE — Telephone Encounter (Signed)
Tried to call patient back but mailbox is full and cannot accept messages

## 2017-05-16 ENCOUNTER — Other Ambulatory Visit: Payer: Self-pay | Admitting: "Endocrinology

## 2017-05-16 DIAGNOSIS — N183 Chronic kidney disease, stage 3 (moderate): Secondary | ICD-10-CM | POA: Diagnosis not present

## 2017-05-16 DIAGNOSIS — E1122 Type 2 diabetes mellitus with diabetic chronic kidney disease: Secondary | ICD-10-CM | POA: Diagnosis not present

## 2017-05-16 DIAGNOSIS — Z794 Long term (current) use of insulin: Secondary | ICD-10-CM | POA: Diagnosis not present

## 2017-05-16 LAB — COMPREHENSIVE METABOLIC PANEL
ALK PHOS: 66 U/L (ref 33–130)
ALT: 11 U/L (ref 6–29)
AST: 18 U/L (ref 10–35)
Albumin: 4.2 g/dL (ref 3.6–5.1)
BUN: 24 mg/dL (ref 7–25)
CO2: 26 mmol/L (ref 20–32)
CREATININE: 1.15 mg/dL — AB (ref 0.60–0.88)
Calcium: 9.6 mg/dL (ref 8.6–10.4)
Chloride: 100 mmol/L (ref 98–110)
GLUCOSE: 128 mg/dL — AB (ref 65–99)
Potassium: 5.1 mmol/L (ref 3.5–5.3)
SODIUM: 135 mmol/L (ref 135–146)
TOTAL PROTEIN: 6.8 g/dL (ref 6.1–8.1)
Total Bilirubin: 0.5 mg/dL (ref 0.2–1.2)

## 2017-05-16 NOTE — Telephone Encounter (Signed)
Can be seen tomorrow in same day slot

## 2017-05-16 NOTE — Telephone Encounter (Signed)
I called patient to schedule her for tomorrow, no answer and voice mail is full.

## 2017-05-17 ENCOUNTER — Encounter: Payer: Self-pay | Admitting: Family Medicine

## 2017-05-17 ENCOUNTER — Ambulatory Visit (HOSPITAL_COMMUNITY)
Admission: RE | Admit: 2017-05-17 | Discharge: 2017-05-17 | Disposition: A | Discharge status: Home / Self Care | Payer: PPO | Source: Ambulatory Visit | Attending: Family Medicine | Admitting: Family Medicine

## 2017-05-17 ENCOUNTER — Ambulatory Visit: Payer: Self-pay | Admitting: Family Medicine

## 2017-05-17 ENCOUNTER — Ambulatory Visit (INDEPENDENT_AMBULATORY_CARE_PROVIDER_SITE_OTHER): Payer: PPO | Admitting: Family Medicine

## 2017-05-17 VITALS — BP 138/64 | HR 69 | Temp 97.6°F | Ht 61.0 in | Wt 130.0 lb

## 2017-05-17 DIAGNOSIS — M79661 Pain in right lower leg: Secondary | ICD-10-CM | POA: Diagnosis not present

## 2017-05-17 DIAGNOSIS — Z23 Encounter for immunization: Secondary | ICD-10-CM

## 2017-05-17 DIAGNOSIS — S81811A Laceration without foreign body, right lower leg, initial encounter: Secondary | ICD-10-CM

## 2017-05-17 DIAGNOSIS — I10 Essential (primary) hypertension: Secondary | ICD-10-CM

## 2017-05-17 DIAGNOSIS — M898X9 Other specified disorders of bone, unspecified site: Secondary | ICD-10-CM

## 2017-05-17 LAB — CBC
HEMATOCRIT: 40.1 % (ref 35.0–45.0)
Hemoglobin: 12.9 g/dL (ref 11.7–15.5)
MCH: 30.2 pg (ref 27.0–33.0)
MCHC: 32.2 g/dL (ref 32.0–36.0)
MCV: 93.9 fL (ref 80.0–100.0)
MPV: 9.9 fL (ref 7.5–12.5)
PLATELETS: 331 10*3/uL (ref 140–400)
RBC: 4.27 MIL/uL (ref 3.80–5.10)
RDW: 14 % (ref 11.0–15.0)
WBC: 5.9 10*3/uL (ref 3.8–10.8)

## 2017-05-17 LAB — HEMOGLOBIN A1C
HEMOGLOBIN A1C: 7.5 % — AB (ref <5.7)
Mean Plasma Glucose: 169 mg/dL

## 2017-05-17 MED ORDER — CLINDAMYCIN HCL 150 MG PO CAPS: 150.0000 mg | ORAL_CAPSULE | Freq: Three times a day (TID) | ORAL | 0 refills | Status: DC

## 2017-05-17 MED ORDER — BENZONATATE 100 MG PO CAPS: 100.0000 mg | ORAL_CAPSULE | Freq: Two times a day (BID) | ORAL | 0 refills | Status: DC | PRN

## 2017-05-17 MED ORDER — MONTELUKAST SODIUM 10 MG PO TABS
10.0000 mg | ORAL_TABLET | Freq: Every day | ORAL | 3 refills | Status: DC
Start: 2017-05-17 — End: 2017-12-04

## 2017-05-17 NOTE — Progress Notes (Signed)
   Emily Jennings     MRN: [id number]      DOB: 1934/02/18   HPI [name] is here stating that on 05/07/2017 she scraped her right shin down to the bone now it is red and tender, no fever , chills or pus draining  From it, had recently been on Z pack  ROS See HPI  Denies recent fever or chills. Denies sinus pressure, nasal congestion, ear pain or sore throat. Denies chest congestion, productive cough or wheezing. Denies chest pains, palpitations and leg swelling C/o right leg pain and mild limitation in mobility Denies headaches, seizures, numbness, or tingling. Denies depression, anxiety or insomnia.  PE  BP 138/64 (BP Location: Left Arm, Patient Position: Sitting, Cuff Size: Normal)   Pulse 69   Temp 97.6 F (36.4 C)   Ht 5\' 1"  (1.549 m)   Wt 130 lb (59 kg)   SpO2 98%   BMI 24.56 kg/m   Patient alert and oriented and in no cardiopulmonary distress.  HEENT: No facial asymmetry, EOMI,   oropharynx pink and moist.  Neck supple no JVD, no mass.  Chest: Clear to auscultation bilaterally.  CVS: S1, S2 no murmurs, no S3.Regular rate.  Ext: No edema  MS: Adequate ROM spine, shoulders, hips and knees.  Skin: erythema , warmth and tenderness with healing laceration approximately 10 inches long on right shin, no current purulent drainage , ut crusting present   Psych: Good eye contact, normal affect. Memory intact not anxious or depressed appearing.  CNS: CN 2-12 intact, power,  normal throughout.no focal deficits noted.   Assessment & Plan  Laceration of skin of right lower leg 11 day h/o right leg trauma with residual swelling , warmth and tenderness, , x ray of leg ,labs and 1 week antibiotic course  Essential hypertension Controlled, no change in medication DASH diet and commitment to daily physical activity for a minimum of 30 minutes discussed and encouraged, as a part of hypertension management. The importance of attaining a healthy weight is also  discussed.  BP/Weight 05/17/2017 04/30/2017 03/30/2017 03/19/2017 01/31/2017 11/28/2016 6/59/9357  Systolic BP 017 793 903 009 233 007 622  Diastolic BP 64 64 68 60 67 70 60  Wt. (Lbs) 130 133.25 135.08 137 136 135 135  BMI 24.56 25.18 25.52 25.89 25.7 25.51 25.51

## 2017-05-17 NOTE — Patient Instructions (Signed)
F/u as before  Call if you need me sooner  Keep skin over the cut clean and dry  1 week course of antibiotic cleocin is prescribed  Xray of rigth leg today  CBC and chem 7 and EGFR today  Flu vaccine today  Please do not try to behave like a teenager!  Thank you  for choosing [facility]. We consider it a privelige to serve you.  Delivering excellent health care in a caring and  compassionate way is our goal.  Partnering with you,  so that together we can achieve this goal is our strategy.

## 2017-05-18 ENCOUNTER — Encounter: Payer: Self-pay | Admitting: Family Medicine

## 2017-05-19 DIAGNOSIS — S81811A Laceration without foreign body, right lower leg, initial encounter: Secondary | ICD-10-CM | POA: Insufficient documentation

## 2017-05-19 NOTE — Assessment & Plan Note (Signed)
11 day h/o right leg trauma with residual swelling , warmth and tenderness, , x ray of leg ,labs and 1 week antibiotic course

## 2017-05-19 NOTE — Assessment & Plan Note (Signed)
Controlled, no change in medication DASH diet and commitment to daily physical activity for a minimum of 30 minutes discussed and encouraged, as a part of hypertension management. The importance of attaining a healthy weight is also discussed.  BP/Weight 05/17/2017 04/30/2017 03/30/2017 03/19/2017 01/31/2017 11/28/2016 8/84/5733  Systolic BP 448 301 599 689 570 220 266  Diastolic BP 64 64 68 60 67 70 60  Wt. (Lbs) 130 133.25 135.08 137 136 135 135  BMI 24.56 25.18 25.52 25.89 25.7 25.51 25.51

## 2017-05-31 ENCOUNTER — Ambulatory Visit (INDEPENDENT_AMBULATORY_CARE_PROVIDER_SITE_OTHER): Payer: PPO | Admitting: "Endocrinology

## 2017-05-31 ENCOUNTER — Encounter: Payer: Self-pay | Admitting: "Endocrinology

## 2017-05-31 VITALS — BP 132/75 | HR 69 | Ht 61.0 in | Wt 131.0 lb

## 2017-05-31 DIAGNOSIS — Z794 Long term (current) use of insulin: Secondary | ICD-10-CM

## 2017-05-31 DIAGNOSIS — I10 Essential (primary) hypertension: Secondary | ICD-10-CM

## 2017-05-31 DIAGNOSIS — N183 Chronic kidney disease, stage 3 unspecified: Secondary | ICD-10-CM

## 2017-05-31 DIAGNOSIS — E1122 Type 2 diabetes mellitus with diabetic chronic kidney disease: Secondary | ICD-10-CM | POA: Diagnosis not present

## 2017-05-31 NOTE — Progress Notes (Signed)
Subjective:    Patient ID: Emily Jennings, female    DOB: [birth year],    Past Medical History:  Diagnosis Date  . Allergy   . Anxiety   . Arthritis   . Cancer [facility]) 2009   breast, carcinoma in situ left  . Carotid stenosis    11/16/2005  mild plaque formation and stenosis proximal right ECA  . Cataract   . Complication of anesthesia   . Coronary artery disease    cardiac catheterization on 03/20/2006  LAD mid 40% stenosis, left circumflex mild 40% stenosis, RCA mid-vessel 40% to 50% lesion   EF 60%  . Diabetes mellitus   . GERD (gastroesophageal reflux disease)   . Hernia, inguinal    left  . Hyperglycemia   . Hypertension   . Low blood potassium   . Non-insulin dependent type 2 diabetes mellitus ([place])   . Osteoporosis   . Shortness of breath    2D Echocardiogram 01/26/2009   EF of greater than 55%, mild MR, mild TR, normal ventricular function  . Ventricular tachycardia, non-sustained (HCC)    developed during stress test 02/08/2006, spontaneously aborted, mild reversible apical defect   Past Surgical History:  Procedure Laterality Date  . BREAST LUMPECTOMY     Left breast 2009  . CATARACT EXTRACTION W/PHACO Left 10/28/2014   Procedure: PHACO EMULSION CATARACT EXTRACTION WITH INTRAOCULAR LENS IMPLANT LEFT EYE (IOC);  Surgeon: Marylynn Pearson, MD;  Location: [facility];  Service: Ophthalmology;  Laterality: Left;  . COLONOSCOPY    . cyst removed from left foot     Social History   Social History  . Marital status: Married    Spouse name: N/A  . Number of children: N/A  . Years of education: na   Occupational History  . retired Retired   Social History Main Topics  . Smoking status: Never Smoker  . Smokeless tobacco: Never Used  . Alcohol use No  . Drug use: No  . Sexual activity: Not Currently    Birth control/ protection: Post-menopausal   Other Topics Concern  . None   Social History Narrative  . None   Outpatient Encounter Prescriptions as of 05/31/2017   Medication Sig  . acetaminophen (TYLENOL) 500 MG tablet Take 500 mg by mouth every 6 (six) hours as needed.  Marland Kitchen albuterol (PROVENTIL HFA;VENTOLIN HFA) 108 (90 Base) MCG/ACT inhaler Inhale 1-2 puffs into the lungs every 6 (six) hours as needed for wheezing or shortness of breath.  Marland Kitchen alendronate (FOSAMAX) 70 MG tablet TAKE 1 TABLET BY MOUTH EVERY 7 DAYS. TAKE WITH A FULL GLASS OF WATER ON AN EMPTY STOMACH  . amLODipine (NORVASC) 10 MG tablet Take 1 tablet (10 mg total) by mouth daily.  Marland Kitchen azelastine (ASTELIN) 0.1 % nasal spray Place 2 sprays into both nostrils 2 (two) times daily. Use in each nostril as directed  . B-D INS SYRINGE 0.5CC/31GX5/16 31G X 5/16" 0.5 ML MISC 1 EACH BY DOES NOT APPLY ROUTE 2 (TWO) TIMES DAILY.  Marland Kitchen B-D ULTRAFINE III SHORT PEN 31G X 8 MM MISC USE AS DIRECTED  . benzonatate (TESSALON) 100 MG capsule Take 1 capsule (100 mg total) by mouth 2 (two) times daily as needed for cough.  . blood glucose meter kit and supplies KIT One touch Ultra. Use up to two times daily as directed. (FOR ICD E11.65)  . calcium-vitamin D (OSCAL WITH D) 500-200 MG-UNIT per tablet Take 1 tablet by mouth daily with breakfast.   . loperamide (  ANTI-DIARRHEAL) 2 MG tablet Take 4 mg by mouth as needed for diarrhea or loose stools.   Marland Kitchen loratadine (ALLERGY RELIEF) 10 MG tablet Take 10 mg by mouth daily as needed for allergies.   Marland Kitchen losartan (COZAAR) 100 MG tablet Take 1 tablet (100 mg total) by mouth daily.  . metoprolol (LOPRESSOR) 50 MG tablet Take 1 tablet (50 mg total) by mouth 2 (two) times daily.  . montelukast (SINGULAIR) 10 MG tablet Take 1 tablet (10 mg total) by mouth at bedtime.  . Multiple Vitamins-Minerals (MULTIVITAMIN WITH IRON-MINERALS) liquid Take by mouth daily.  Marland Kitchen NOVOLOG MIX 70/30 (70-30) 100 UNIT/ML injection INJECT 15 UNITS WITH BREAKFAST AND 10 UNITS WITH SUPPER WHEN BLOOD GLUCOSE IS ABOVE 90.  . omega-3 acid ethyl esters (LOVAZA) 1 g capsule Take by mouth 2 (two) times daily.  . ONE  TOUCH ULTRA TEST test strip USE AS INSTRUCTED 4 X DAILY. E11.65  . ONETOUCH DELICA LANCETS 55V MISC USE TO OBTAIN A BLOOD SPECIMEN 2 TIMES A DAY  . potassium chloride SA (KLOR-CON M20) 20 MEQ tablet Take 1 tablet (20 mEq total) by mouth 2 (two) times daily.  Marland Kitchen spironolactone (ALDACTONE) 25 MG tablet Take 1 tablet (25 mg total) by mouth 2 (two) times daily.  Marland Kitchen sulfamethoxazole-trimethoprim (BACTRIM DS,SEPTRA DS) 800-160 MG tablet Take 1 tablet by mouth 2 (two) times daily.  . [DISCONTINUED] benzonatate (TESSALON) 100 MG capsule Take 1 capsule (100 mg total) by mouth 2 (two) times daily as needed for cough.  . [DISCONTINUED] clindamycin (CLEOCIN) 150 MG capsule Take 1 capsule (150 mg total) by mouth 3 (three) times daily.   No facility-administered encounter medications on file as of 05/31/2017.    ALLERGIES: Allergies  Allergen Reactions  . Benadryl [Diphenhydramine Hcl] Hypertension  . Citalopram     Pt unsure  . Metformin And Related Diarrhea  . Tramadol Other (See Comments)    Felt light headed and dizzy  . Tylenol [Acetaminophen] Other (See Comments)    Patient stated tylenol makes her "feel high"   VACCINATION STATUS: Immunization History  Administered Date(s) Administered  . Influenza Split 07/17/2011  . Influenza Whole 06/18/2007, 07/29/2009, 05/16/2013  . Influenza,inj,Quad PF,6+ Mos 05/19/2014, 08/22/2016, 05/17/2017  . Pneumococcal Conjugate-13 03/24/2014  . Pneumococcal Polysaccharide-23 05/20/2008  . Td 06/18/2002  . Tdap 07/17/2011    Diabetes  She presents for her follow-up diabetic visit. She has type 2 diabetes mellitus. Onset time: She was diagnosed at approximate age of 16 years. Her disease course has been stable. There are no hypoglycemic associated symptoms. Pertinent negatives for hypoglycemia include no confusion, headaches, pallor or seizures. There are no diabetic associated symptoms. Pertinent negatives for diabetes include no chest pain, no fatigue, no  polydipsia, no polyphagia and no polyuria. There are no hypoglycemic complications. Symptoms are stable. Diabetic complications include nephropathy. Risk factors for coronary artery disease include diabetes mellitus, dyslipidemia, hypertension and sedentary lifestyle. Current diabetic treatment includes insulin injections. She is compliant with treatment some of the time. Her weight is stable. She is following a generally unhealthy diet. She has had a previous visit with a dietitian. She never participates in exercise. Her breakfast blood glucose range is generally 140-180 mg/dl. Her lunch blood glucose range is generally 140-180 mg/dl. Her dinner blood glucose range is generally 140-180 mg/dl. Her overall blood glucose range is 140-180 mg/dl. An ACE inhibitor/angiotensin II receptor blocker is being taken.  Hyperlipidemia  This is a chronic problem. The current episode started more than 1 year ago.  Pertinent negatives include no chest pain, myalgias or shortness of breath. Risk factors for coronary artery disease include diabetes mellitus, dyslipidemia and a sedentary lifestyle.  Hypertension  This is a chronic problem. The current episode started more than 1 year ago. Pertinent negatives include no chest pain, headaches, palpitations or shortness of breath. Risk factors for coronary artery disease include diabetes mellitus, dyslipidemia and sedentary lifestyle. Past treatments include angiotensin blockers.      Review of Systems  Constitutional: Negative for fatigue and unexpected weight change.  HENT: Negative for trouble swallowing and voice change.   Eyes: Negative for visual disturbance.  Respiratory: Negative for cough, shortness of breath and wheezing.   Cardiovascular: Negative for chest pain, palpitations and leg swelling.  Gastrointestinal: Negative for diarrhea, nausea and vomiting.  Endocrine: Negative for cold intolerance, heat intolerance, polydipsia, polyphagia and polyuria.   Musculoskeletal: Negative for arthralgias and myalgias.  Skin: Negative for color change, pallor, rash and wound.  Neurological: Negative for seizures and headaches.  Psychiatric/Behavioral: Negative for confusion and suicidal ideas.    Objective:    BP 132/75   Pulse 69   Ht _0  (1.549 m)   Wt 131 lb (59.4 kg)   BMI 24.75 kg/m   Wt Readings from Last 3 Encounters:  05/31/17 131 lb (59.4 kg)  05/17/17 130 lb (59 kg)  04/30/17 133 lb 4 oz (60.4 kg)    Physical Exam  Constitutional: She is oriented to person, place, and time. She appears well-developed.  HENT:  Head: Normocephalic and atraumatic.  Eyes: EOM are normal.  Neck: Normal range of motion. Neck supple. No tracheal deviation present. No thyromegaly present.  Cardiovascular: Normal rate and regular rhythm.   Pulmonary/Chest: Effort normal and breath sounds normal.  Abdominal: Soft. Bowel sounds are normal. There is no tenderness. There is no guarding.  Musculoskeletal: Normal range of motion. She exhibits no edema.  Neurological: She is alert and oriented to person, place, and time. She has normal reflexes. No cranial nerve deficit. Coordination normal.  Skin: Skin is warm and dry. No rash noted. No erythema. No pallor.  Psychiatric: She has a normal mood and affect. Judgment normal.    Results for orders placed or performed in visit on 05/16/17  CBC  Result Value Ref Range   WBC 5.9 3.8 - 10.8 K/uL   RBC 4.27 3.80 - 5.10 MIL/uL   Hemoglobin 12.9 11.7 - 15.5 g/dL   HCT 40.1 35.0 - 45.0 %   MCV 93.9 80.0 - 100.0 fL   MCH 30.2 27.0 - 33.0 pg   MCHC 32.2 32.0 - 36.0 g/dL   RDW 14.0 11.0 - 15.0 %   Platelets 331 140 - 400 K/uL   MPV 9.9 7.5 - 12.5 fL   Complete Blood Count (Most recent): Lab Results  Component Value Date   WBC 5.9 05/16/2017   HGB 12.9 05/16/2017   HCT 40.1 05/16/2017   MCV 93.9 05/16/2017   PLT 331 05/16/2017   Chemistry (most recent): Lab Results  Component Value Date   NA 135  05/16/2017   K 5.1 05/16/2017   CL 100 05/16/2017   CO2 26 05/16/2017   BUN 24 05/16/2017   CREATININE 1.15 (H) 05/16/2017   Diabetic Labs (most recent): Lab Results  Component Value Date   HGBA1C 7.5 (H) 05/16/2017   HGBA1C 7.3 (H) 01/26/2017   HGBA1C 7.0 (H) 10/31/2016   Lipid profile (most recent): Lab Results  Component Value Date   TRIG 39 10/31/2016  CHOL 188 10/31/2016     Assessment & Plan:   1. Type 2 diabetes mellitus with stage 3 chronic kidney disease, with long-term current use of insulin   Patient came with Improving blood glucose profile .  No major hypoglycemic, her recent A1c is Stable at 7.5 %, generally improved from  9.2 %. Recent labs reviewed.  Patient remains at a high risk for more acute and chronic complications of diabetes which include CAD, CVA, CKD, retinopathy, and neuropathy. These are all discussed in detail with the patient. I have re-counseled the patient on diet management and weight loss, by adopting a carbohydrate restricted / protein rich  Diet. Patient is advised to stick to a routine mealtimes to eat 3 meals  a day and avoid unnecessary snacks ( to snack only to correct hypoglycemia). Patient is advised to eliminate simple carbs  from their diet including cakes desserts ice cream soda (  diet and regular) , sweet tea , Candies,  chips and cookies, artificial sweeteners,   and "sugar-free" products .  This will help patient to have stable blood glucose profile and potentially lose weight.  The patient  Has been scheduled with Jearld Fenton, RDN, CDE for individualized DM education. I have approached patient to continue on  intensive monitoring of blood glucose and insulin therapy, and patient agrees. She would need basal bolus insulin.  However she cannot follow optimal insulin instructions for safe use.  - I had a long discussion with her specifically to avoid injecting insulin when pre-meal blood glucose levels are below 90.   - For now  , I will continue with  premixed  insulin NovoLog 70/30  15 units  with breakfast  and 10 units with supper when pre-meal glucose is above 90 mg/dL . She is advised to continue  strict monitoring of blood glucose before meals and at bedtime.  -Patient is encouraged to call clinic for blood glucose levels less than 70 or above 300 mg /dl.  -Target numbers for A1c, LDL, HDL, Triglycerides, Waist Circumference were discussed in detail.  2) HTN: controlled. Continue current medications including ACEI. 3. Lipids: Controlled, LDL 73 . She is not on statins.  4)  Weight/Diet: CDE consult in progress, exercise, and carbs information provided.   4) Chronic Care:  -Patient is on ARB  medications and encouraged to continue to follow up with Ophthalmology, Podiatrist at least yearly or according to recommendations, and advised to stay away from smoking. I have recommended yearly flu vaccine and pneumonia vaccination at least every 5 years; and  sleep for at least 7 hours a day.  - Time spent with the patient: 25 min, of which >50% was spent in reviewing her sugar logs , discussing her hypo- and hyper-glycemic episodes, reviewing her current and  previous labs and insulin doses and developing a plan to avoid hypo- and hyper-glycemia.    Follow up plan: Return in about 3 months (around 08/30/2017) for meter, and logs.  Glade Lloyd, MD Phone: [phone]  Fax: [phone]  This note was partially dictated with voice recognition software. Similar sounding words can be transcribed inadequately or may not  be corrected upon review.  05/31/2017, 2:47 PM

## 2017-06-13 ENCOUNTER — Other Ambulatory Visit: Payer: Self-pay | Admitting: Family Medicine

## 2017-06-19 ENCOUNTER — Ambulatory Visit (INDEPENDENT_AMBULATORY_CARE_PROVIDER_SITE_OTHER): Payer: PPO | Admitting: Podiatry

## 2017-06-19 ENCOUNTER — Encounter: Payer: Self-pay | Admitting: Podiatry

## 2017-06-19 DIAGNOSIS — B351 Tinea unguium: Secondary | ICD-10-CM

## 2017-06-19 DIAGNOSIS — E1149 Type 2 diabetes mellitus with other diabetic neurological complication: Secondary | ICD-10-CM

## 2017-06-19 DIAGNOSIS — M79676 Pain in unspecified toe(s): Secondary | ICD-10-CM | POA: Diagnosis not present

## 2017-06-19 NOTE — Progress Notes (Signed)
Complaint:  Visit Type: Patient returns to my office for continued preventative foot care services. Complaint: Patient states" my nails have grown long and thick and become painful to walk and wear shoes" Patient has been diagnosed with DM with no foot complications. The patient presents for preventative foot care services. No changes to ROS  Podiatric Exam: Vascular: dorsalis pedis and posterior tibial pulses are palpable bilateral. Capillary return is immediate. Temperature gradient is WNL. Skin turgor WNL  Sensorium: Diminished/absent Semmes Weinstein monofilament test. Normal tactile sensation bilaterally. Nail Exam: Pt has thick disfigured discolored nails with subungual debris noted bilateral entire nail hallux through fifth toenails Ulcer Exam: There is no evidence of ulcer or pre-ulcerative changes or infection. Orthopedic Exam: Muscle tone and strength are WNL. No limitations in general ROM. No crepitus or effusions noted. Foot type and digits show no abnormalities. HAV  B/L Skin: No Porokeratosis. No infection or ulcers  Diagnosis:  Onychomycosis, , Pain in right toe, pain in left toes  Treatment & Plan Procedures and Treatment: Consent by patient was obtained for treatment procedures.   Debridement of mycotic and hypertrophic toenails, 1 through 5 bilateral and clearing of subungual debris. No ulceration, no infection noted.  Return Visit-Office Procedure: Patient instructed to return to the office for a follow up visit 3 months for continued evaluation and treatment.    Gardiner Barefoot DPM

## 2017-07-23 ENCOUNTER — Other Ambulatory Visit: Payer: Self-pay | Admitting: "Endocrinology

## 2017-08-08 ENCOUNTER — Ambulatory Visit: Payer: Self-pay | Admitting: Family Medicine

## 2017-08-14 ENCOUNTER — Ambulatory Visit: Payer: Self-pay | Admitting: Family Medicine

## 2017-08-20 ENCOUNTER — Other Ambulatory Visit: Payer: Self-pay | Admitting: Family Medicine

## 2017-08-21 ENCOUNTER — Encounter: Payer: Self-pay | Admitting: Family Medicine

## 2017-08-29 ENCOUNTER — Other Ambulatory Visit (HOSPITAL_COMMUNITY)
Admission: RE | Admit: 2017-08-29 | Discharge: 2017-08-29 | Disposition: A | Discharge status: Home / Self Care | Payer: PPO | Source: Ambulatory Visit | Attending: "Endocrinology | Admitting: "Endocrinology

## 2017-08-29 DIAGNOSIS — Z794 Long term (current) use of insulin: Secondary | ICD-10-CM | POA: Insufficient documentation

## 2017-08-29 LAB — COMPREHENSIVE METABOLIC PANEL
ALT: 15 U/L (ref 14–54)
AST: 24 U/L (ref 15–41)
Albumin: 3.9 g/dL (ref 3.5–5.0)
Alkaline Phosphatase: 64 U/L (ref 38–126)
Anion gap: 8 (ref 5–15)
BILIRUBIN TOTAL: 0.6 mg/dL (ref 0.3–1.2)
BUN: 23 mg/dL — AB (ref 6–20)
CO2: 30 mmol/L (ref 22–32)
CREATININE: 1.09 mg/dL — AB (ref 0.44–1.00)
Calcium: 9.5 mg/dL (ref 8.9–10.3)
Chloride: 98 mmol/L — ABNORMAL LOW (ref 101–111)
GFR calc Af Amer: 54 mL/min — ABNORMAL LOW (ref >60)
GFR, EST NON AFRICAN AMERICAN: 46 mL/min — AB (ref >60)
Glucose, Bld: 234 mg/dL — ABNORMAL HIGH (ref 65–99)
POTASSIUM: 3.6 mmol/L (ref 3.5–5.1)
Sodium: 136 mmol/L (ref 135–145)
TOTAL PROTEIN: 6.8 g/dL (ref 6.5–8.1)

## 2017-08-29 LAB — HEMOGLOBIN A1C
HEMOGLOBIN A1C: 7.2 % — AB (ref 4.8–5.6)
Mean Plasma Glucose: 159.94 mg/dL

## 2017-08-30 ENCOUNTER — Encounter: Payer: Self-pay | Admitting: "Endocrinology

## 2017-08-30 ENCOUNTER — Ambulatory Visit (INDEPENDENT_AMBULATORY_CARE_PROVIDER_SITE_OTHER): Payer: PPO | Admitting: "Endocrinology

## 2017-08-30 VITALS — BP 189/72 | HR 71 | Ht 61.0 in | Wt 131.0 lb

## 2017-08-30 DIAGNOSIS — Z794 Long term (current) use of insulin: Secondary | ICD-10-CM

## 2017-08-30 DIAGNOSIS — N183 Chronic kidney disease, stage 3 unspecified: Secondary | ICD-10-CM

## 2017-08-30 DIAGNOSIS — E1122 Type 2 diabetes mellitus with diabetic chronic kidney disease: Secondary | ICD-10-CM

## 2017-08-30 DIAGNOSIS — I10 Essential (primary) hypertension: Secondary | ICD-10-CM

## 2017-08-30 LAB — VITAMIN D 25 HYDROXY (VIT D DEFICIENCY, FRACTURES): VIT D 25 HYDROXY: 47.5 ng/mL (ref 30.0–100.0)

## 2017-08-30 NOTE — Progress Notes (Signed)
Subjective:    Patient ID: Emily Jennings, female    DOB: 1933/11/30,    Past Medical History:  Diagnosis Date  . Allergy   . Anxiety   . Arthritis   . Cancer [facility]) 2009   breast, carcinoma in situ left  . Carotid stenosis    11/16/2005  mild plaque formation and stenosis proximal right ECA  . Cataract   . Complication of anesthesia   . Coronary artery disease    cardiac catheterization on 03/20/2006  LAD mid 40% stenosis, left circumflex mild 40% stenosis, RCA mid-vessel 40% to 50% lesion   EF 60%  . Diabetes mellitus   . GERD (gastroesophageal reflux disease)   . Hernia, inguinal    left  . Hyperglycemia   . Hypertension   . Low blood potassium   . Non-insulin dependent type 2 diabetes mellitus ([place])   . Osteoporosis   . Shortness of breath    2D Echocardiogram 01/26/2009   EF of greater than 55%, mild MR, mild TR, normal ventricular function  . Ventricular tachycardia, non-sustained (HCC)    developed during stress test 02/08/2006, spontaneously aborted, mild reversible apical defect   Past Surgical History:  Procedure Laterality Date  . BREAST LUMPECTOMY     Left breast 2009  . CATARACT EXTRACTION W/PHACO Left 10/28/2014   Procedure: PHACO EMULSION CATARACT EXTRACTION WITH INTRAOCULAR LENS IMPLANT LEFT EYE (IOC);  Surgeon: Marylynn Pearson, MD;  Location: [facility];  Service: Ophthalmology;  Laterality: Left;  . COLONOSCOPY    . cyst removed from left foot     Social History   Socioeconomic History  . Marital status: Married    Spouse name: None  . Number of children: None  . Years of education: na  . Highest education level: None  Social Needs  . Financial resource strain: None  . Food insecurity - worry: None  . Food insecurity - inability: None  . Transportation needs - medical: None  . Transportation needs - non-medical: None  Occupational History  . Occupation: retired    Fish farm manager: retired  Tobacco Use  . Smoking status: Never Smoker  . Smokeless tobacco:  Never Used  Substance and Sexual Activity  . Alcohol use: No  . Drug use: No  . Sexual activity: Not Currently    Birth control/protection: Post-menopausal  Other Topics Concern  . None  Social History Narrative  . None   Outpatient Encounter Medications as of 08/30/2017  Medication Sig  . acetaminophen (TYLENOL) 500 MG tablet Take 500 mg by mouth every 6 (six) hours as needed.  Marland Kitchen albuterol (PROVENTIL HFA;VENTOLIN HFA) 108 (90 Base) MCG/ACT inhaler Inhale 1-2 puffs into the lungs every 6 (six) hours as needed for wheezing or shortness of breath.  Marland Kitchen alendronate (FOSAMAX) 70 MG tablet TAKE 1 TABLET BY MOUTH EVERY 7 DAYS. TAKE WITH A FULL GLASS OF WATER ON AN EMPTY STOMACH  . amLODipine (NORVASC) 10 MG tablet Take 1 tablet (10 mg total) by mouth daily.  Marland Kitchen amLODipine (NORVASC) 10 MG tablet TAKE 1 TABLET BY MOUTH EVERY DAY  . azelastine (ASTELIN) 0.1 % nasal spray Place 2 sprays into both nostrils 2 (two) times daily. Use in each nostril as directed  . B-D INS SYRINGE 0.5CC/31GX5/16 31G X 5/16" 0.5 ML MISC 1 EACH BY DOES NOT APPLY ROUTE 2 (TWO) TIMES DAILY.  Marland Kitchen B-D ULTRAFINE III SHORT PEN 31G X 8 MM MISC USE AS DIRECTED  . benzonatate (TESSALON) 100 MG capsule Take 1  capsule (100 mg total) by mouth 2 (two) times daily as needed for cough.  . blood glucose meter kit and supplies KIT One touch Ultra. Use up to two times daily as directed. (FOR ICD E11.65)  . calcium-vitamin D (OSCAL WITH D) 500-200 MG-UNIT per tablet Take 1 tablet by mouth daily with breakfast.   . loperamide (ANTI-DIARRHEAL) 2 MG tablet Take 4 mg by mouth as needed for diarrhea or loose stools.   Marland Kitchen loratadine (ALLERGY RELIEF) 10 MG tablet Take 10 mg by mouth daily as needed for allergies.   Marland Kitchen losartan (COZAAR) 100 MG tablet Take 1 tablet (100 mg total) by mouth daily.  . metoprolol tartrate (LOPRESSOR) 50 MG tablet TAKE 1 TABLET (50 MG TOTAL) BY MOUTH 2 (TWO) TIMES DAILY.  . montelukast (SINGULAIR) 10 MG tablet Take 1 tablet (10  mg total) by mouth at bedtime.  . Multiple Vitamins-Minerals (MULTIVITAMIN WITH IRON-MINERALS) liquid Take by mouth daily.  Marland Kitchen NOVOLOG MIX 70/30 (70-30) 100 UNIT/ML injection INJECT 15 UNITS WITH BREAKFAST AND 10 UNITS WITH SUPPER WHEN BLOOD GLUCOSE IS ABOVE 90.  . omega-3 acid ethyl esters (LOVAZA) 1 g capsule Take by mouth 2 (two) times daily.  . ONE TOUCH ULTRA TEST test strip USE AS INSTRUCTED 4 X DAILY. E11.65  . ONETOUCH DELICA LANCETS 02O MISC USE TO OBTAIN A BLOOD SPECIMEN 2 TIMES A DAY  . potassium chloride SA (KLOR-CON M20) 20 MEQ tablet Take 1 tablet (20 mEq total) by mouth 2 (two) times daily.  Marland Kitchen spironolactone (ALDACTONE) 25 MG tablet Take 1 tablet (25 mg total) by mouth 2 (two) times daily.  Marland Kitchen sulfamethoxazole-trimethoprim (BACTRIM DS,SEPTRA DS) 800-160 MG tablet Take 1 tablet by mouth 2 (two) times daily.   No facility-administered encounter medications on file as of 08/30/2017.    ALLERGIES: Allergies  Allergen Reactions  . Benadryl [Diphenhydramine Hcl] Hypertension  . Citalopram     Pt unsure  . Metformin And Related Diarrhea  . Tramadol Other (See Comments)    Felt light headed and dizzy  . Tylenol [Acetaminophen] Other (See Comments)    Patient stated tylenol makes her "feel high"   VACCINATION STATUS: Immunization History  Administered Date(s) Administered  . Influenza Split 07/17/2011  . Influenza Whole 06/18/2007, 07/29/2009, 05/16/2013  . Influenza,inj,Quad PF,6+ Mos 05/19/2014, 08/22/2016, 05/17/2017  . Pneumococcal Conjugate-13 03/24/2014  . Pneumococcal Polysaccharide-23 05/20/2008  . Td 06/18/2002  . Tdap 07/17/2011    Diabetes  She presents for her follow-up diabetic visit. She has type 2 diabetes mellitus. Onset time: She was diagnosed at approximate age of 55 years. Her disease course has been improving. There are no hypoglycemic associated symptoms. Pertinent negatives for hypoglycemia include no confusion, headaches, pallor or seizures. There are  no diabetic associated symptoms. Pertinent negatives for diabetes include no chest pain, no fatigue, no polydipsia, no polyphagia and no polyuria. There are no hypoglycemic complications. Symptoms are improving. Diabetic complications include nephropathy. Risk factors for coronary artery disease include diabetes mellitus, dyslipidemia, hypertension and sedentary lifestyle. Current diabetic treatment includes insulin injections. She is compliant with treatment some of the time. Her weight is stable. She is following a generally unhealthy diet. She has had a previous visit with a dietitian. She never participates in exercise. Her home blood glucose trend is fluctuating minimally. Her breakfast blood glucose range is generally 140-180 mg/dl. Her lunch blood glucose range is generally 140-180 mg/dl. Her dinner blood glucose range is generally 140-180 mg/dl. Her overall blood glucose range is 140-180 mg/dl. An  ACE inhibitor/angiotensin II receptor blocker is being taken.  Hyperlipidemia  This is a chronic problem. The current episode started more than 1 year ago. Pertinent negatives include no chest pain, myalgias or shortness of breath. Risk factors for coronary artery disease include diabetes mellitus, dyslipidemia and a sedentary lifestyle.  Hypertension  This is a chronic problem. The current episode started more than 1 year ago. Pertinent negatives include no chest pain, headaches, palpitations or shortness of breath. Risk factors for coronary artery disease include diabetes mellitus, dyslipidemia and sedentary lifestyle. Past treatments include angiotensin blockers.      Review of Systems  Constitutional: Negative for fatigue and unexpected weight change.  HENT: Negative for trouble swallowing and voice change.   Eyes: Negative for visual disturbance.  Respiratory: Negative for cough, shortness of breath and wheezing.   Cardiovascular: Negative for chest pain, palpitations and leg swelling.   Gastrointestinal: Negative for diarrhea, nausea and vomiting.  Endocrine: Negative for cold intolerance, heat intolerance, polydipsia, polyphagia and polyuria.  Musculoskeletal: Negative for arthralgias and myalgias.  Skin: Negative for color change, pallor, rash and wound.  Neurological: Negative for seizures and headaches.  Psychiatric/Behavioral: Negative for confusion and suicidal ideas.    Objective:    BP (!) 189/72   Pulse 71   Ht 5' 1"  (1.549 m)   Wt 131 lb (59.4 kg)   BMI 24.75 kg/m   Wt Readings from Last 3 Encounters:  08/30/17 131 lb (59.4 kg)  05/31/17 131 lb (59.4 kg)  05/17/17 130 lb (59 kg)    Physical Exam  Constitutional: She is oriented to person, place, and time. She appears well-developed.  HENT:  Head: Normocephalic and atraumatic.  Eyes: EOM are normal.  Neck: Normal range of motion. Neck supple. No tracheal deviation present. No thyromegaly present.  Cardiovascular: Normal rate and regular rhythm.  Pulmonary/Chest: Effort normal and breath sounds normal.  Abdominal: Soft. Bowel sounds are normal. There is no tenderness. There is no guarding.  Musculoskeletal: Normal range of motion. She exhibits no edema.  Neurological: She is alert and oriented to person, place, and time. She has normal reflexes. No cranial nerve deficit. Coordination normal.  Skin: Skin is warm and dry. No rash noted. No erythema. No pallor.  Psychiatric: She has a normal mood and affect. Judgment normal.    Results for orders placed or performed during the hospital encounter of 08/29/17  Comprehensive metabolic panel  Result Value Ref Range   Sodium 136 135 - 145 mmol/L   Potassium 3.6 3.5 - 5.1 mmol/L   Chloride 98 (L) 101 - 111 mmol/L   CO2 30 22 - 32 mmol/L   Glucose, Bld 234 (H) 65 - 99 mg/dL   BUN 23 (H) 6 - 20 mg/dL   Creatinine, Ser 1.09 (H) 0.44 - 1.00 mg/dL   Calcium 9.5 8.9 - 10.3 mg/dL   Total Protein 6.8 6.5 - 8.1 g/dL   Albumin 3.9 3.5 - 5.0 g/dL   AST 24 15 -  41 U/L   ALT 15 14 - 54 U/L   Alkaline Phosphatase 64 38 - 126 U/L   Total Bilirubin 0.6 0.3 - 1.2 mg/dL   GFR calc non Af Amer 46 (L) >60 mL/min   GFR calc Af Amer 54 (L) >60 mL/min   Anion gap 8 5 - 15  Hemoglobin A1c  Result Value Ref Range   Hgb A1c MFr Bld 7.2 (H) 4.8 - 5.6 %   Mean Plasma Glucose 159.94 mg/dL  VITAMIN D  25 Hydroxy (Vit-D Deficiency, Fractures)  Result Value Ref Range   Vit D, 25-Hydroxy 47.5 30.0 - 100.0 ng/mL   Complete Blood Count (Most recent): Lab Results  Component Value Date   WBC 5.9 05/16/2017   HGB 12.9 05/16/2017   HCT 40.1 05/16/2017   MCV 93.9 05/16/2017   PLT 331 05/16/2017   Chemistry (most recent): Lab Results  Component Value Date   NA 136 08/29/2017   K 3.6 08/29/2017   CL 98 (L) 08/29/2017   CO2 30 08/29/2017   BUN 23 (H) 08/29/2017   CREATININE 1.09 (H) 08/29/2017   Diabetic Labs (most recent): Lab Results  Component Value Date   HGBA1C 7.2 (H) 08/29/2017   HGBA1C 7.5 (H) 05/16/2017   HGBA1C 7.3 (H) 01/26/2017   Lipid profile (most recent): Lab Results  Component Value Date   TRIG 39 10/31/2016   CHOL 188 10/31/2016     Assessment & Plan:   1. Type 2 diabetes mellitus with stage 3 chronic kidney disease, with long-term current use of insulin   Patient came with Improving blood glucose profile .  No major hypoglycemic, her recent A1c is Stable at 7.2 %, generally improved from  9.2 %. Recent labs reviewed.  Patient remains at a high risk for more acute and chronic complications of diabetes which include CAD, CVA, CKD, retinopathy, and neuropathy. These are all discussed in detail with the patient. I have re-counseled the patient on diet management and weight loss, by adopting a carbohydrate restricted / protein rich  Diet.  -  Suggestion is made for her to avoid simple carbohydrates  from her diet including Cakes, Sweet Desserts / Pastries, Ice Cream, Soda (diet and regular), Sweet Tea, Candies, Chips, Cookies, Store  Bought Juices, Alcohol in Excess of  1-2 drinks a day, Artificial Sweeteners, and "Sugar-free" Products. This will help patient to have stable blood glucose profile and potentially avoid unintended weight gain.  I have approached patient to continue on  intensive monitoring of blood glucose and insulin therapy, and patient agrees.   - She has done fairly well on premixed insulin, I will continue on the same regimen. - I had a long discussion with her specifically to avoid injecting insulin when pre-meal blood glucose levels are below 90.   -  I will continue with NovoLog 70/30  15 units  with breakfast  and 10 units with supper when pre-meal glucose is above 90 mg/dL . - She is warned not to inject insulin without proper monitoring of blood glucose. She is advised to continue  strict monitoring of blood glucose before meals and at bedtime.  -Patient is encouraged to call clinic for blood glucose levels less than 70 or above 300 mg /dl.  -Target numbers for A1c, LDL, HDL, Triglycerides, Waist Circumference were discussed in detail.  2) HTN: uncontrolled. Continue current medications including ACEI. 3. Lipids: Controlled, LDL 73 . She is not on statins.  4)  Weight/Diet: CDE consult in progress, exercise, and carbs information provided.   4) Chronic Care:  -Patient is on ARB  medications and encouraged to continue to follow up with Ophthalmology, Podiatrist at least yearly or according to recommendations, and advised to stay away from smoking. I have recommended yearly flu vaccine and pneumonia vaccination at least every 5 years; and  sleep for at least 7 hours a day.  - Time spent with the patient: 25 min, of which >50% was spent in reviewing her sugar logs , discussing her hypo- and  hyper-glycemic episodes, reviewing her current and  previous labs and insulin doses and developing a plan to avoid hypo- and hyper-glycemia.   Follow up plan: Return in about 3 months (around  11/28/2017).  Glade Lloyd, MD Phone: [phone]  Fax: [phone]  This note was partially dictated with voice recognition software. Similar sounding words can be transcribed inadequately or may not  be corrected upon review.  08/30/2017, 3:19 PM

## 2017-09-06 ENCOUNTER — Ambulatory Visit (INDEPENDENT_AMBULATORY_CARE_PROVIDER_SITE_OTHER): Payer: PPO | Admitting: Family Medicine

## 2017-09-06 VITALS — BP 132/84 | HR 72 | Resp 14 | Ht 61.0 in | Wt 131.0 lb

## 2017-09-06 DIAGNOSIS — Z794 Long term (current) use of insulin: Secondary | ICD-10-CM | POA: Diagnosis not present

## 2017-09-06 DIAGNOSIS — J3089 Other allergic rhinitis: Secondary | ICD-10-CM

## 2017-09-06 DIAGNOSIS — I10 Essential (primary) hypertension: Secondary | ICD-10-CM | POA: Diagnosis not present

## 2017-09-06 DIAGNOSIS — Z780 Asymptomatic menopausal state: Secondary | ICD-10-CM | POA: Diagnosis not present

## 2017-09-06 DIAGNOSIS — N183 Chronic kidney disease, stage 3 (moderate): Secondary | ICD-10-CM | POA: Diagnosis not present

## 2017-09-06 DIAGNOSIS — E1122 Type 2 diabetes mellitus with diabetic chronic kidney disease: Secondary | ICD-10-CM | POA: Diagnosis not present

## 2017-09-06 MED ORDER — LOSARTAN POTASSIUM 50 MG PO TABS: 50.0000 mg | ORAL_TABLET | Freq: Every day | ORAL | 3 refills | Status: DC

## 2017-09-06 NOTE — Patient Instructions (Addendum)
Wellness with nurse  end January  Physical exam with MD mid July or after  Blood pressure is improved  Please take allergy medication as prescribed  All the best for 2019!  Keep active   Thank you  for choosing [facility]. We consider it a privelige to serve you.  Delivering excellent health care in a caring and  compassionate way is our goal.  Partnering with you,  so that together we can achieve this goal is our strategy.

## 2017-09-07 ENCOUNTER — Other Ambulatory Visit: Payer: Self-pay | Admitting: Family Medicine

## 2017-09-08 ENCOUNTER — Encounter: Payer: Self-pay | Admitting: Family Medicine

## 2017-09-08 NOTE — Assessment & Plan Note (Signed)
Controlled, no change in medication DASH diet and commitment to daily physical activity for a minimum of 30 minutes discussed and encouraged, as a part of hypertension management. The importance of attaining a healthy weight is also discussed.  BP/Weight 09/06/2017 08/30/2017 05/31/2017 05/17/2017 04/30/2017 6/60/6301 6/0/1093  Systolic BP 235 573 [phone] 762  Diastolic BP 84 72 75 64 64 68 60  Wt. (Lbs) 131 131 131 130 133.25 135.08 137  BMI 24.75 24.75 24.75 24.56 25.18 25.52 25.89

## 2017-09-08 NOTE — Assessment & Plan Note (Signed)
Controlled, no change in medication  

## 2017-09-08 NOTE — Progress Notes (Signed)
Emily Jennings     MRN: [id number]      DOB: April 26, 1934   HPI Emily Jennings is here for follow up and re-evaluation of chronic medical conditions, medication management and review of any available recent lab and radiology data.  Preventive health is updated, specifically  Cancer screening and Immunization.   Questions or concerns regarding consultations or procedures which the PT has had in the interim are  addressed. The PT denies any adverse reactions to current medications since the last visit.  There are no new concerns.  There are no specific complaints  Denies polyuria, polydipsia, blurred vision , or hypoglycemic episodes.   ROS Denies recent fever or chills. Denies sinus pressure, nasal congestion, ear pain or sore throat. Denies chest congestion, productive cough or wheezing. Denies chest pains, palpitations and leg swelling Denies abdominal pain, nausea, vomiting,diarrhea or constipation.   Denies dysuria, frequency, hesitancy or incontinence. Denies joint pain, swelling and limitation in mobility. Denies headaches, seizures, numbness, or tingling. Denies depression, anxiety or insomnia. Denies skin break down or rash.   PE BP 132/84   Pulse 72   Resp 14   Ht 5\' 1"  (1.549 m)   Wt 131 lb (59.4 kg)   SpO2 97%   BMI 24.75 kg/m     Patient alert and oriented and in no cardiopulmonary distress.  HEENT: No facial asymmetry, EOMI,   oropharynx pink and moist.  Neck supple no JVD, no mass.  Chest: Clear to auscultation bilaterally.  CVS: S1, S2 no murmurs, no S3.Regular rate.  ABD: Soft non tender.   Ext: No edema  MS: Adequate ROM spine, shoulders, hips and knees.  Skin: Intact, no ulcerations or rash noted.  Psych: Good eye contact, normal affect. Memory intact not anxious or depressed appearing.  CNS: CN 2-12 intact, power,  normal throughout.no focal deficits noted.   Assessment & Plan Essential hypertension Controlled, no change in medication DASH diet  and commitment to daily physical activity for a minimum of 30 minutes discussed and encouraged, as a part of hypertension management. The importance of attaining a healthy weight is also discussed.  BP/Weight 09/06/2017 08/30/2017 05/31/2017 05/17/2017 04/30/2017 2/67/1245 8/0/9983  Systolic BP 382 505 397 673 419 379 024  Diastolic BP 84 72 75 64 64 68 60  Wt. (Lbs) 131 131 131 130 133.25 135.08 137  BMI 24.75 24.75 24.75 24.56 25.18 25.52 25.89       Type 2 diabetes mellitus with stage 3 chronic kidney disease, with long-term current use of insulin (HCC) Managed by endo and controlled [name] is reminded of the importance of commitment to daily physical activity for 30 minutes or more, as able and the need to limit carbohydrate intake to 30 to 60 grams per meal to help with blood sugar control.   The need to take medication as prescribed, test blood sugar as directed, and to call between visits if there is a concern that blood sugar is uncontrolled is also discussed.   [name] is reminded of the importance of daily foot exam, annual eye examination, and good blood sugar, blood pressure and cholesterol control.  Diabetic Labs Latest Ref Rng & Units 08/29/2017 05/16/2017 01/26/2017 10/31/2016 07/26/2016  HbA1c 4.8 - 5.6 % 7.2(H) 7.5(H) 7.3(H) 7.0(H) 7.2(H)  Microalbumin Not estab mg/dL - - - - -  Micro/Creat Ratio <30 mcg/mg creat - - - - -  Chol <200 mg/dL - - - 188 -  HDL >50 mg/dL - - - 107 -  Calc LDL <100 mg/dL - - - 73 -  Triglycerides <150 mg/dL - - - 39 -  Creatinine 0.44 - 1.00 mg/dL 1.09(H) 1.15(H) 1.53(H) 1.48(H) 1.27(H)   BP/Weight 09/06/2017 08/30/2017 05/31/2017 05/17/2017 04/30/2017 5/43/6067 7/0/3403  Systolic BP 524 818 590 931 121 624 469  Diastolic BP 84 72 75 64 64 68 60  Wt. (Lbs) 131 131 131 130 133.25 135.08 137  BMI 24.75 24.75 24.75 24.56 25.18 25.52 25.89   Foot/eye exam completion dates Latest Ref Rng & Units 03/19/2017 01/25/2016  Eye Exam No Retinopathy - -    Foot Form Completion - Done Done        Allergic rhinitis Controlled, no change in medication

## 2017-09-08 NOTE — Progress Notes (Deleted)
Emily Jennings     MRN: [id number]      DOB: Dec 02, 1933   HPI Emily Jennings is here for follow up and re-evaluation of chronic medical conditions, medication management and review of any available recent lab and radiology data.  Preventive health is updated, specifically  Cancer screening and Immunization.   Questions or concerns regarding consultations or procedures which the PT has had in the interim are  addressed. The PT denies any adverse reactions to current medications since the last visit.  There are no new concerns.  There are no specific complaints  Denies polyuria, polydipsia, blurred vision , or hypoglycemic episodes.   ROS Denies recent fever or chills. Denies sinus pressure, nasal congestion, ear pain or sore throat. Denies chest congestion, productive cough or wheezing. Denies chest pains, palpitations and leg swelling Denies abdominal pain, nausea, vomiting,diarrhea or constipation.   Denies dysuria, frequency, hesitancy or incontinence. Denies joint pain, swelling and limitation in mobility. Denies headaches, seizures, numbness, or tingling. Denies depression, anxiety or insomnia. Denies skin break down or rash.   PE BP 132/84   Pulse 72   Resp 14   Ht 5\' 1"  (1.549 m)   Wt 131 lb (59.4 kg)   SpO2 97%   BMI 24.75 kg/m     Patient alert and oriented and in no cardiopulmonary distress.  HEENT: No facial asymmetry, EOMI,   oropharynx pink and moist.  Neck supple no JVD, no mass.  Chest: Clear to auscultation bilaterally.  CVS: S1, S2 no murmurs, no S3.Regular rate.  ABD: Soft non tender.   Ext: No edema  MS: Adequate ROM spine, shoulders, hips and knees.  Skin: Intact, no ulcerations or rash noted.  Psych: Good eye contact, normal affect. Memory intact not anxious or depressed appearing.  CNS: CN 2-12 intact, power,  normal throughout.no focal deficits noted.   Assessment & Plan Essential hypertension Controlled, no change in medication DASH diet  and commitment to daily physical activity for a minimum of 30 minutes discussed and encouraged, as a part of hypertension management. The importance of attaining a healthy weight is also discussed.  BP/Weight 09/06/2017 08/30/2017 05/31/2017 05/17/2017 04/30/2017 0/99/8338 10/23/537  Systolic BP 767 341 937 902 409 735 329  Diastolic BP 84 72 75 64 64 68 60  Wt. (Lbs) 131 131 131 130 133.25 135.08 137  BMI 24.75 24.75 24.75 24.56 25.18 25.52 25.89       Type 2 diabetes mellitus with stage 3 chronic kidney disease, with long-term current use of insulin (HCC) Managed by endo and controlled Emily Jennings is reminded of the importance of commitment to daily physical activity for 30 minutes or more, as able and the need to limit carbohydrate intake to 30 to 60 grams per meal to help with blood sugar control.   The need to take medication as prescribed, test blood sugar as directed, and to call between visits if there is a concern that blood sugar is uncontrolled is also discussed.   Emily Jennings is reminded of the importance of daily foot exam, annual eye examination, and good blood sugar, blood pressure and cholesterol control.  Diabetic Labs Latest Ref Rng & Units 08/29/2017 05/16/2017 01/26/2017 10/31/2016 07/26/2016  HbA1c 4.8 - 5.6 % 7.2(H) 7.5(H) 7.3(H) 7.0(H) 7.2(H)  Microalbumin Not estab mg/dL - - - - -  Micro/Creat Ratio <30 mcg/mg creat - - - - -  Chol <200 mg/dL - - - 188 -  HDL >50 mg/dL - - - 107 -  Calc LDL <100 mg/dL - - - 73 -  Triglycerides <150 mg/dL - - - 39 -  Creatinine 0.44 - 1.00 mg/dL 1.09(H) 1.15(H) 1.53(H) 1.48(H) 1.27(H)   BP/Weight 09/06/2017 08/30/2017 05/31/2017 05/17/2017 04/30/2017 3/82/5053 05/25/6733  Systolic BP 193 790 240 973 532 992 426  Diastolic BP 84 72 75 64 64 68 60  Wt. (Lbs) 131 131 131 130 133.25 135.08 137  BMI 24.75 24.75 24.75 24.56 25.18 25.52 25.89   Foot/eye exam completion dates Latest Ref Rng & Units 03/19/2017 01/25/2016  Eye Exam No Retinopathy - -    Foot Form Completion - Done Done        Allergic rhinitis Controlled, no change in medication

## 2017-09-08 NOTE — Assessment & Plan Note (Signed)
Managed by endo and controlled Emily Jennings is reminded of the importance of commitment to daily physical activity for 30 minutes or more, as able and the need to limit carbohydrate intake to 30 to 60 grams per meal to help with blood sugar control.   The need to take medication as prescribed, test blood sugar as directed, and to call between visits if there is a concern that blood sugar is uncontrolled is also discussed.   Emily Jennings is reminded of the importance of daily foot exam, annual eye examination, and good blood sugar, blood pressure and cholesterol control.  Diabetic Labs Latest Ref Rng & Units 08/29/2017 05/16/2017 01/26/2017 10/31/2016 07/26/2016  HbA1c 4.8 - 5.6 % 7.2(H) 7.5(H) 7.3(H) 7.0(H) 7.2(H)  Microalbumin Not estab mg/dL - - - - -  Micro/Creat Ratio <30 mcg/mg creat - - - - -  Chol <200 mg/dL - - - 188 -  HDL >50 mg/dL - - - 107 -  Calc LDL <100 mg/dL - - - 73 -  Triglycerides <150 mg/dL - - - 39 -  Creatinine 0.44 - 1.00 mg/dL 1.09(H) 1.15(H) 1.53(H) 1.48(H) 1.27(H)   BP/Weight 09/06/2017 08/30/2017 05/31/2017 05/17/2017 04/30/2017 6/85/9923 12/18/4434  Systolic BP 016 580 063 494 944 739 584  Diastolic BP 84 72 75 64 64 68 60  Wt. (Lbs) 131 131 131 130 133.25 135.08 137  BMI 24.75 24.75 24.75 24.56 25.18 25.52 25.89   Foot/eye exam completion dates Latest Ref Rng & Units 03/19/2017 01/25/2016  Eye Exam No Retinopathy - -  Foot Form Completion - Done Done

## 2017-09-12 ENCOUNTER — Other Ambulatory Visit: Payer: Self-pay

## 2017-09-12 MED ORDER — METOPROLOL TARTRATE 50 MG PO TABS: 50.0000 mg | ORAL_TABLET | Freq: Two times a day (BID) | ORAL | 1 refills | Status: DC

## 2017-09-14 ENCOUNTER — Other Ambulatory Visit: Payer: Self-pay | Admitting: Family Medicine

## 2017-09-14 DIAGNOSIS — Z1231 Encounter for screening mammogram for malignant neoplasm of breast: Secondary | ICD-10-CM

## 2017-09-19 ENCOUNTER — Ambulatory Visit: Payer: PPO | Admitting: Podiatry

## 2017-10-01 ENCOUNTER — Ambulatory Visit: Payer: Self-pay

## 2017-10-01 ENCOUNTER — Ambulatory Visit (INDEPENDENT_AMBULATORY_CARE_PROVIDER_SITE_OTHER): Payer: PPO

## 2017-10-01 VITALS — BP 120/60 | HR 62 | Temp 98.0°F | Resp 16 | Ht 61.5 in | Wt 130.8 lb

## 2017-10-01 DIAGNOSIS — Z0000 Encounter for general adult medical examination without abnormal findings: Secondary | ICD-10-CM

## 2017-10-01 NOTE — Progress Notes (Signed)
Subjective:   Emily Jennings is a 84 y.o. female who presents for Medicare Annual (Subsequent) preventive examination.  Review of Systems:         Objective:     Vitals: There were no vitals taken for this visit.  There is no height or weight on file to calculate BMI.  Advanced Directives 09/28/2016 03/29/2015 01/06/2015  Does Patient Have a Medical Advance Directive? No No No  Would patient like information on creating a medical advance directive? Yes (MAU/Ambulatory/Procedural Areas - Information given) - No - patient declined information    Tobacco Social History   Tobacco Use  Smoking Status Never Smoker  Smokeless Tobacco Never Used     Counseling given: Not Answered   Clinical Intake:                       Past Medical History:  Diagnosis Date  . Allergy   . Anxiety   . Arthritis   . Cancer [facility]) 2009   breast, carcinoma in situ left  . Carotid stenosis    11/16/2005  mild plaque formation and stenosis proximal right ECA  . Cataract   . Complication of anesthesia   . Coronary artery disease    cardiac catheterization on 03/20/2006  LAD mid 40% stenosis, left circumflex mild 40% stenosis, RCA mid-vessel 40% to 50% lesion   EF 60%  . Diabetes mellitus   . GERD (gastroesophageal reflux disease)   . Hernia, inguinal    left  . Hyperglycemia   . Hypertension   . Low blood potassium   . Non-insulin dependent type 2 diabetes mellitus ([place])   . Osteoporosis   . Shortness of breath    2D Echocardiogram 01/26/2009   EF of greater than 55%, mild MR, mild TR, normal ventricular function  . Ventricular tachycardia, non-sustained (HCC)    developed during stress test 02/08/2006, spontaneously aborted, mild reversible apical defect   Past Surgical History:  Procedure Laterality Date  . BREAST LUMPECTOMY     Left breast 2009  . CATARACT EXTRACTION W/PHACO Left 10/28/2014   Procedure: PHACO EMULSION CATARACT EXTRACTION WITH INTRAOCULAR LENS IMPLANT LEFT  EYE (IOC);  Surgeon: Marylynn Pearson, MD;  Location: [place];  Service: Ophthalmology;  Laterality: Left;  . COLONOSCOPY    . cyst removed from left foot     Family History  Problem Relation Age of Onset  . Hypertension Mother   . Hyperlipidemia Mother   . Stroke Mother   . Cancer Father        pancreatic  . Colon cancer Father   . Heart disease Brother 40       bypass  . Heart disease Brother 25       bypass  . Arthritis Unknown   . Asthma Unknown   . Diabetes Unknown   . Colon cancer Paternal Aunt   . Esophageal cancer Neg Hx   . Stomach cancer Neg Hx   . Rectal cancer Neg Hx    Social History   Socioeconomic History  . Marital status: Married    Spouse name: Not on file  . Number of children: Not on file  . Years of education: na  . Highest education level: Not on file  Social Needs  . Financial resource strain: Not on file  . Food insecurity - worry: Not on file  . Food insecurity - inability: Not on file  . Transportation needs - medical: Not on file  .  Transportation needs - non-medical: Not on file  Occupational History  . Occupation: retired    Fish farm manager: retired  Tobacco Use  . Smoking status: Never Smoker  . Smokeless tobacco: Never Used  Substance and Sexual Activity  . Alcohol use: No  . Drug use: No  . Sexual activity: Not Currently    Birth control/protection: Post-menopausal  Other Topics Concern  . Not on file  Social History Narrative  . Not on file    Outpatient Encounter Medications as of 10/01/2017  Medication Sig  . acetaminophen (TYLENOL) 500 MG tablet Take 500 mg by mouth every 6 (six) hours as needed.  Marland Kitchen albuterol (PROVENTIL HFA;VENTOLIN HFA) 108 (90 Base) MCG/ACT inhaler Inhale 1-2 puffs into the lungs every 6 (six) hours as needed for wheezing or shortness of breath.  Marland Kitchen alendronate (FOSAMAX) 70 MG tablet TAKE 1 TABLET BY MOUTH EVERY 7 DAYS. TAKE WITH A FULL GLASS OF WATER ON AN EMPTY STOMACH  . amLODipine (NORVASC) 10 MG tablet Take 1  tablet (10 mg total) by mouth daily.  Marland Kitchen azelastine (ASTELIN) 0.1 % nasal spray Place 2 sprays into both nostrils 2 (two) times daily. Use in each nostril as directed  . B-D INS SYRINGE 0.5CC/31GX5/16 31G X 5/16" 0.5 ML MISC 1 EACH BY DOES NOT APPLY ROUTE 2 (TWO) TIMES DAILY.  Marland Kitchen B-D ULTRAFINE III SHORT PEN 31G X 8 MM MISC USE AS DIRECTED  . blood glucose meter kit and supplies KIT One touch Ultra. Use up to two times daily as directed. (FOR ICD E11.65)  . calcium-vitamin D (OSCAL WITH D) 500-200 MG-UNIT per tablet Take 1 tablet by mouth daily with breakfast.   . loratadine (ALLERGY RELIEF) 10 MG tablet Take 10 mg by mouth daily as needed for allergies.   Marland Kitchen losartan (COZAAR) 50 MG tablet Take 1 tablet (50 mg total) by mouth daily.  . metoprolol tartrate (LOPRESSOR) 50 MG tablet Take 1 tablet (50 mg total) by mouth 2 (two) times daily.  . montelukast (SINGULAIR) 10 MG tablet Take 1 tablet (10 mg total) by mouth at bedtime.  . Multiple Vitamins-Minerals (MULTIVITAMIN WITH IRON-MINERALS) liquid Take by mouth daily.  Marland Kitchen NOVOLOG MIX 70/30 (70-30) 100 UNIT/ML injection INJECT 15 UNITS WITH BREAKFAST AND 10 UNITS WITH SUPPER WHEN BLOOD GLUCOSE IS ABOVE 90.  . omega-3 acid ethyl esters (LOVAZA) 1 g capsule Take by mouth 2 (two) times daily.  . ONE TOUCH ULTRA TEST test strip USE AS INSTRUCTED 4 X DAILY. E11.65  . ONETOUCH DELICA LANCETS 42A MISC USE TO OBTAIN A BLOOD SPECIMEN 2 TIMES A DAY  . potassium chloride SA (KLOR-CON M20) 20 MEQ tablet Take 1 tablet (20 mEq total) by mouth 2 (two) times daily.  Marland Kitchen spironolactone (ALDACTONE) 25 MG tablet Take 1 tablet (25 mg total) by mouth 2 (two) times daily.   No facility-administered encounter medications on file as of 10/01/2017.     Activities of Daily Living In your present state of health, do you have any difficulty performing the following activities: 03/30/2017  Hearing? N  Vision? N  Difficulty concentrating or making decisions? N  Walking or climbing  stairs? N  Dressing or bathing? N  Doing errands, shopping? N  Some recent data might be hidden    Patient Care Team: [name], MD as PCP - General [name], [name], MD as Consulting Physician (Endocrinology) Marylynn Pearson, MD as Consulting Physician (Ophthalmology)    Assessment:   This is a routine wellness examination for Emily Jennings.  Exercise Activities and Dietary recommendations    Goals    . Exercise 3x per week (30 min per time) (pt-stated)     Starting 09/29/2016 I would like to start exercising 20 minutes a day every day of the week.       Fall Risk Fall Risk  05/31/2017 05/31/2017 05/17/2017 03/19/2017 01/31/2017  Falls in the past year? No No No No No  Number falls in past yr: - - - - -  Injury with Fall? - - - - -  Risk for fall due to : - - - - -  Risk for fall due to: Comment - - - - -   Is the patient's home free of loose throw rugs in walkways, pet beds, electrical cords, etc?   yes      Grab bars in the bathroom? no      Handrails on the stairs?   No stairs in the home      Adequate lighting?   yes    Depression Screen PHQ 2/9 Scores 05/31/2017 05/31/2017 05/17/2017 03/19/2017  PHQ - 2 Score 0 0 0 0  PHQ- 9 Score - - - -     Cognitive Function     6CIT Screen 09/28/2016  What Year? 0 points  What month? 0 points  What time? 0 points  Count back from 20 0 points  Months in reverse 0 points  Repeat phrase 2 points  Total Score 2    Immunization History  Administered Date(s) Administered  . Influenza Split 07/17/2011  . Influenza Whole 06/18/2007, 07/29/2009, 05/16/2013  . Influenza,inj,Quad PF,6+ Mos 05/19/2014, 08/22/2016, 05/17/2017  . Pneumococcal Conjugate-13 03/24/2014  . Pneumococcal Polysaccharide-23 05/20/2008  . Td 06/18/2002  . Tdap 07/17/2011    Qualifies for Shingles Vaccine? Yes, she will discuss with Dr. Moshe Cipro  Screening Tests Health Maintenance  Topic Date Due  . COLONOSCOPY  12/24/2017  . HEMOGLOBIN A1C   02/27/2018  . FOOT EXAM  03/19/2018  . OPHTHALMOLOGY EXAM  04/12/2018  . TETANUS/TDAP  07/16/2021  . INFLUENZA VACCINE  Completed  . DEXA SCAN  Completed  . PNA vac Low Risk Adult  Completed    Cancer Screenings: Lung: Low Dose CT Chest recommended if Age 36-80 years, 30 pack-year currently smoking OR have quit w/in 15years. Patient does not qualify. Breast:  Up to date on Mammogram? Yes   Up to date of Bone Density/Dexa? Yes Colorectal: Will discuss with Dr. Moshe Cipro      Plan:      I have personally reviewed and noted the following in the patient's chart:   . Medical and social history . Use of alcohol, tobacco or illicit drugs  . Current medications and supplements . Functional ability and status . Nutritional status . Physical activity . Advanced directives . List of other physicians . Hospitalizations, surgeries, and ER visits in previous 12 months . Vitals . Screenings to include cognitive, depression, and falls . Referrals and appointments  In addition, I have reviewed and discussed with patient certain preventive protocols, quality metrics, and best practice recommendations. A written personalized care plan for preventive services as well as general preventive health recommendations were provided to patient.     Merceda Elks, LPN  1/61/0960

## 2017-10-01 NOTE — Patient Instructions (Signed)
Emily Jennings , Thank you for taking time to come for your Medicare Wellness Visit. I appreciate your ongoing commitment to your health goals. Please review the following plan we discussed and let me know if I can assist you in the future.   Screening recommendations/referrals: Colonoscopy: Discuss with Dr. Moshe Cipro- it looks like you may be due this year Mammogram: Scheduled for 10/18/2017 Bone Density: Scheduled for 10/18/2017 Recommended yearly ophthalmology/optometry visit for glaucoma screening and checkup Recommended yearly dental visit for hygiene and checkup  Vaccinations: Influenza vaccine: Due 04/2018 Pneumococcal vaccine: Due 2020 Tdap vaccine: Due 2022 Shingles vaccine: Discuss with Dr. Moshe Cipro- we highly recommend this!    Advanced directives: Discussed today in office  Conditions/risks identified: Fall  Next appointment: 04/03/2018   Preventive Care 65 Years and Older, Female Preventive care refers to lifestyle choices and visits with your health care provider that can promote health and wellness. What does preventive care include?  A yearly physical exam. This is also called an annual well check.  Dental exams once or twice a year.  Routine eye exams. Ask your health care provider how often you should have your eyes checked.  Personal lifestyle choices, including:  Daily care of your teeth and gums.  Regular physical activity.  Eating a healthy diet.  Avoiding tobacco and drug use.  Limiting alcohol use.  Practicing safe sex.  Taking low-dose aspirin every day.  Taking vitamin and mineral supplements as recommended by your health care provider. What happens during an annual well check? The services and screenings done by your health care provider during your annual well check will depend on your age, overall health, lifestyle risk factors, and family history of disease. Counseling  Your health care provider may ask you questions about your:  Alcohol  use.  Tobacco use.  Drug use.  Emotional well-being.  Home and relationship well-being.  Sexual activity.  Eating habits.  History of falls.  Memory and ability to understand (cognition).  Work and work Statistician.  Reproductive health. Screening  You may have the following tests or measurements:  Height, weight, and BMI.  Blood pressure.  Lipid and cholesterol levels. These may be checked every 5 years, or more frequently if you are over 7 years old.  Skin check.  Lung cancer screening. You may have this screening every year starting at age 4 if you have a 30-pack-year history of smoking and currently smoke or have quit within the past 15 years.  Fecal occult blood test (FOBT) of the stool. You may have this test every year starting at age 53.  Flexible sigmoidoscopy or colonoscopy. You may have a sigmoidoscopy every 5 years or a colonoscopy every 10 years starting at age 57.  Hepatitis C blood test.  Hepatitis B blood test.  Sexually transmitted disease (STD) testing.  Diabetes screening. This is done by checking your blood sugar (glucose) after you have not eaten for a while (fasting). You may have this done every 1-3 years.  Bone density scan. This is done to screen for osteoporosis. You may have this done starting at age 59.  Mammogram. This may be done every 1-2 years. Talk to your health care provider about how often you should have regular mammograms. Talk with your health care provider about your test results, treatment options, and if necessary, the need for more tests. Vaccines  Your health care provider may recommend certain vaccines, such as:  Influenza vaccine. This is recommended every year.  Tetanus, diphtheria, and acellular pertussis (  Tdap, Td) vaccine. You may need a Td booster every 10 years.  Zoster vaccine. You may need this after age 42.  Pneumococcal 13-valent conjugate (PCV13) vaccine. One dose is recommended after age  57.  Pneumococcal polysaccharide (PPSV23) vaccine. One dose is recommended after age 2. Talk to your health care provider about which screenings and vaccines you need and how often you need them. This information is not intended to replace advice given to you by your health care provider. Make sure you discuss any questions you have with your health care provider. Document Released: 10/01/2015 Document Revised: 05/24/2016 Document Reviewed: 07/06/2015 Elsevier Interactive Patient Education  2017 [place] Prevention in the Home Falls can cause injuries. They can happen to people of all ages. There are many things you can do to make your home safe and to help prevent falls. What can I do on the outside of my home?  Regularly fix the edges of walkways and driveways and fix any cracks.  Remove anything that might make you trip as you walk through a door, such as a raised step or threshold.  Trim any bushes or trees on the path to your home.  Use bright outdoor lighting.  Clear any walking paths of anything that might make someone trip, such as rocks or tools.  Regularly check to see if handrails are loose or broken. Make sure that both sides of any steps have handrails.  Any raised decks and porches should have guardrails on the edges.  Have any leaves, snow, or ice cleared regularly.  Use sand or salt on walking paths during winter.  Clean up any spills in your garage right away. This includes oil or grease spills. What can I do in the bathroom?  Use night lights.  Install grab bars by the toilet and in the tub and shower. Do not use towel bars as grab bars.  Use non-skid mats or decals in the tub or shower.  If you need to sit down in the shower, use a plastic, non-slip stool.  Keep the floor dry. Clean up any water that spills on the floor as soon as it happens.  Remove soap buildup in the tub or shower regularly.  Attach bath mats securely with double-sided  non-slip rug tape.  Do not have throw rugs and other things on the floor that can make you trip. What can I do in the bedroom?  Use night lights.  Make sure that you have a light by your bed that is easy to reach.  Do not use any sheets or blankets that are too big for your bed. They should not hang down onto the floor.  Have a firm chair that has side arms. You can use this for support while you get dressed.  Do not have throw rugs and other things on the floor that can make you trip. What can I do in the kitchen?  Clean up any spills right away.  Avoid walking on wet floors.  Keep items that you use a lot in easy-to-reach places.  If you need to reach something above you, use a strong step stool that has a grab bar.  Keep electrical cords out of the way.  Do not use floor polish or wax that makes floors slippery. If you must use wax, use non-skid floor wax.  Do not have throw rugs and other things on the floor that can make you trip. What can I do with my stairs?  Do  not leave any items on the stairs.  Make sure that there are handrails on both sides of the stairs and use them. Fix handrails that are broken or loose. Make sure that handrails are as long as the stairways.  Check any carpeting to make sure that it is firmly attached to the stairs. Fix any carpet that is loose or worn.  Avoid having throw rugs at the top or bottom of the stairs. If you do have throw rugs, attach them to the floor with carpet tape.  Make sure that you have a light switch at the top of the stairs and the bottom of the stairs. If you do not have them, ask someone to add them for you. What else can I do to help prevent falls?  Wear shoes that:  Do not have high heels.  Have rubber bottoms.  Are comfortable and fit you well.  Are closed at the toe. Do not wear sandals.  If you use a stepladder:  Make sure that it is fully opened. Do not climb a closed stepladder.  Make sure that both  sides of the stepladder are locked into place.  Ask someone to hold it for you, if possible.  Clearly mark and make sure that you can see:  Any grab bars or handrails.  First and last steps.  Where the edge of each step is.  Use tools that help you move around (mobility aids) if they are needed. These include:  Canes.  Walkers.  Scooters.  Crutches.  Turn on the lights when you go into a dark area. Replace any light bulbs as soon as they burn out.  Set up your furniture so you have a clear path. Avoid moving your furniture around.  If any of your floors are uneven, fix them.  If there are any pets around you, be aware of where they are.  Review your medicines with your doctor. Some medicines can make you feel dizzy. This can increase your chance of falling. Ask your doctor what other things that you can do to help prevent falls. This information is not intended to replace advice given to you by your health care provider. Make sure you discuss any questions you have with your health care provider. Document Released: 07/01/2009 Document Revised: 02/10/2016 Document Reviewed: 10/09/2014 Elsevier Interactive Patient Education  2017 [place] for Adults  A healthy lifestyle and preventive care can promote health and wellness. Preventive health guidelines for adults include the following key practices.  . A routine yearly physical is a good way to check with your health care provider about your health and preventive screening. It is a chance to share any concerns and updates on your health and to receive a thorough exam.  . Visit your dentist for a routine exam and preventive care every 6 months. Brush your teeth twice a day and floss once a day. Good oral hygiene prevents tooth decay and gum disease.  . The frequency of eye exams is based on your age, health, family medical history, use  of contact lenses, and other factors. Follow your health care  provider's ecommendations for frequency of eye exams.  . Eat a healthy diet. Foods like vegetables, fruits, whole grains, low-fat dairy products, and lean protein foods contain the nutrients you need without too many calories. Decrease your intake of foods high in solid fats, added sugars, and salt. Eat the right amount of calories for you. Get information about a proper diet from your  health care provider, if necessary.  . Regular physical exercise is one of the most important things you can do for your health. Most adults should get at least 150 minutes of moderate-intensity exercise (any activity that increases your heart rate and causes you to sweat) each week. In addition, most adults need muscle-strengthening exercises on 2 or more days a week.  Silver Sneakers may be a benefit available to you. To determine eligibility, you may visit the website: www.silversneakers.com or contact program at (636)850-3605 Mon-Fri between 8AM-8PM.   . Maintain a healthy weight. The body mass index (BMI) is a screening tool to identify possible weight problems. It provides an estimate of body fat based on height and weight. Your health care provider can find your BMI and can help you achieve or maintain a healthy weight.   For adults 20 years and older: ? A BMI below 18.5 is considered underweight. ? A BMI of 18.5 to 24.9 is normal. ? A BMI of 25 to 29.9 is considered overweight. ? A BMI of 30 and above is considered obese.   . Maintain normal blood lipids and cholesterol levels by exercising and minimizing your intake of saturated fat. Eat a balanced diet with plenty of fruit and vegetables. Blood tests for lipids and cholesterol should begin at age 21 and be repeated every 5 years. If your lipid or cholesterol levels are high, you are over 50, or you are at high risk for heart disease, you may need your cholesterol levels checked more frequently. Ongoing high lipid and cholesterol levels should be treated  with medicines if diet and exercise are not working.  . If you smoke, find out from your health care provider how to quit. If you do not use tobacco, please do not start.  . If you choose to drink alcohol, please do not consume more than 2 drinks per day. One drink is considered to be 12 ounces (355 mL) of beer, 5 ounces (148 mL) of wine, or 1.5 ounces (44 mL) of liquor.  . If you are 24-37 years old, ask your health care provider if you should take aspirin to prevent strokes.  . Use sunscreen. Apply sunscreen liberally and repeatedly throughout the day. You should seek shade when your shadow is shorter than you. Protect yourself by wearing long sleeves, pants, a wide-brimmed hat, and sunglasses year round, whenever you are outdoors.  . Once a month, do a whole body skin exam, using a mirror to look at the skin on your back. Tell your health care provider of new moles, moles that have irregular borders, moles that are larger than a pencil eraser, or moles that have changed in shape or color.

## 2017-10-18 ENCOUNTER — Encounter (HOSPITAL_COMMUNITY): Payer: Self-pay

## 2017-10-18 ENCOUNTER — Ambulatory Visit (HOSPITAL_COMMUNITY)
Admission: RE | Admit: 2017-10-18 | Discharge: 2017-10-18 | Disposition: A | Discharge status: Home / Self Care | Payer: PPO | Source: Ambulatory Visit | Attending: Family Medicine | Admitting: Family Medicine

## 2017-10-18 DIAGNOSIS — Z780 Asymptomatic menopausal state: Secondary | ICD-10-CM | POA: Diagnosis not present

## 2017-10-18 DIAGNOSIS — M810 Age-related osteoporosis without current pathological fracture: Secondary | ICD-10-CM | POA: Diagnosis not present

## 2017-10-18 DIAGNOSIS — Z1231 Encounter for screening mammogram for malignant neoplasm of breast: Secondary | ICD-10-CM | POA: Diagnosis not present

## 2017-10-23 ENCOUNTER — Encounter: Payer: Self-pay | Admitting: Family Medicine

## 2017-10-23 ENCOUNTER — Ambulatory Visit (INDEPENDENT_AMBULATORY_CARE_PROVIDER_SITE_OTHER): Payer: PPO | Admitting: Family Medicine

## 2017-10-23 VITALS — BP 144/74 | HR 74 | Resp 16 | Ht 62.0 in | Wt 131.0 lb

## 2017-10-23 DIAGNOSIS — R634 Abnormal weight loss: Secondary | ICD-10-CM

## 2017-10-23 DIAGNOSIS — R9389 Abnormal findings on diagnostic imaging of other specified body structures: Secondary | ICD-10-CM

## 2017-10-23 DIAGNOSIS — E1122 Type 2 diabetes mellitus with diabetic chronic kidney disease: Secondary | ICD-10-CM | POA: Diagnosis not present

## 2017-10-23 DIAGNOSIS — I10 Essential (primary) hypertension: Secondary | ICD-10-CM

## 2017-10-23 DIAGNOSIS — R102 Pelvic and perineal pain: Secondary | ICD-10-CM

## 2017-10-23 DIAGNOSIS — N183 Chronic kidney disease, stage 3 unspecified: Secondary | ICD-10-CM

## 2017-10-23 DIAGNOSIS — D126 Benign neoplasm of colon, unspecified: Secondary | ICD-10-CM

## 2017-10-23 DIAGNOSIS — Z794 Long term (current) use of insulin: Secondary | ICD-10-CM

## 2017-10-23 DIAGNOSIS — R140 Abdominal distension (gaseous): Secondary | ICD-10-CM

## 2017-10-23 NOTE — Patient Instructions (Signed)
You are referred to Dr Oneida Alar  For colonoscopy  You are referred back to Dr Glo Herring for re evaluation of your womb  You are referred for abdominal and pelvic scan to evaluate pain  We will call with appointment for your scan    Please contact us if you have no telephone service by Thursday for appointment information

## 2017-10-25 ENCOUNTER — Encounter: Payer: Self-pay | Admitting: Family Medicine

## 2017-10-26 DIAGNOSIS — D126 Benign neoplasm of colon, unspecified: Secondary | ICD-10-CM | POA: Insufficient documentation

## 2017-10-26 DIAGNOSIS — R9389 Abnormal findings on diagnostic imaging of other specified body structures: Secondary | ICD-10-CM

## 2017-10-26 HISTORY — DX: Abnormal findings on diagnostic imaging of other specified body structures: R93.89

## 2017-10-26 NOTE — Progress Notes (Signed)
Emily Jennings     MRN: [id number]      DOB: 02/20/1934   HPI Emily Jennings is here with a 3 month h/o RLQ abdominal pain and distension, she is concerned , stating that she be;lieves that it "is time for her repeat colonoscopy" and now she wants to have this done by a local GI Specialist , [place] who recently did her son's colonoscopy, rather than travel to [place]. Record review reports 2 tubular adenomas in her transverse colon on 12/2012.  Denies change in stool caliber or bowel habit Of note , she also had incomplete gyne evaluation  , should have had endometria biopsy but this was not done , for a thickened endometrium, she does have a h/o breast cancer, so she needs to be referred back to gynecology also and I have n made her [place] Denies recent fever or chills. Denies sinus pressure, nasal congestion, ear pain or sore throat. Denies chest congestion, productive cough or wheezing. Denies chest pains, palpitations and leg swelling Denies  nausea, vomiting,diarrhea or constipation.   Denies dysuria, frequency, hesitancy or incontinence. Denies joint pain, swelling and limitation in mobility. Denies headaches, seizures, numbness, or tingling. Denies depression, anxiety or insomnia. Denies skin break down or rash.   PE  BP (!) 144/74   Pulse 74   Resp 16   Ht 5\' 2"  (1.575 m)   Wt 131 lb (59.4 kg)   SpO2 93%   BMI 23.96 kg/m   Patient alert and oriented and in no cardiopulmonary distress.  HEENT: No facial asymmetry, EOMI,   oropharynx pink and moist.  Neck supple no JVD, no mass.  Chest: Clear to auscultation bilaterally.  CVS: S1, S2 no murmurs, no S3.Regular rate.  ABD: Right lower quadrant distended with tenderness, possible mass, no guarding or rebound, normal BS.   Ext: No edema  MS: Adequate though reduced  ROM spine, shoulders, hips and knees.  Skin: Intact, no ulcerations or rash noted.  Psych: Good eye contact, normal  affect. Memory intact not anxious or depressed appearing.  CNS: CN 2-12 intact, power,  normal throughout.no focal deficits noted.   Assessment & Plan  Tubular adenoma of colon 3 month h/o abdominal pain and RLQ distension needs scan and repeat colonoscopy, now requests local GI Doc , referral entered   Essential hypertension Controlled, no change in medication   Type 2 diabetes mellitus with stage 3 chronic kidney disease, with long-term current use of insulin ([place]) Controlled and managed by endo   Diabetic Labs Latest Ref Rng & Units 08/29/2017 05/16/2017 01/26/2017 10/31/2016 07/26/2016  HbA1c 4.8 - 5.6 % 7.2(H) 7.5(H) 7.3(H) 7.0(H) 7.2(H)  Microalbumin Not estab mg/dL - - - - -  Micro/Creat Ratio <30 mcg/mg creat - - - - -  Chol <200 mg/dL - - - 188 -  HDL >50 mg/dL - - - 107 -  Calc LDL <100 mg/dL - - - 73 -  Triglycerides <150 mg/dL - - - 39 -  Creatinine 0.44 - 1.00 mg/dL 1.09(H) 1.15(H) 1.53(H) 1.48(H) 1.27(H)   BP/Weight 10/23/2017 10/01/2017 09/06/2017 08/30/2017 05/31/2017 05/17/2017 05/12/538  Systolic BP 767 341 937 902 409 735 329  Diastolic BP 74 60 84 72 75 64 64  Wt. (Lbs) 131 130.75 131 131 131 130 133.25  BMI 23.96 24.3 24.75 24.75 24.75 24.56 25.18   Foot/eye exam completion dates Latest Ref Rng & Units 03/19/2017 01/25/2016  Eye Exam No Retinopathy - -  Foot Form Completion - Done Done        Thickened endometrium C/o 3 month h/o RLQ pain and distension, has h/o tubular adenomas of colon, transverse x 2 and also thickened endometrium, referred for abdominal and pelvic scan and also to gyne and GI asap

## 2017-10-26 NOTE — Assessment & Plan Note (Signed)
Controlled, no change in medication  

## 2017-10-26 NOTE — Assessment & Plan Note (Signed)
3 month h/o abdominal pain and RLQ distension needs scan and repeat colonoscopy, now requests local GI Doc , referral entered

## 2017-10-26 NOTE — Assessment & Plan Note (Signed)
Controlled and managed by endo   Diabetic Labs Latest Ref Rng & Units 08/29/2017 05/16/2017 01/26/2017 10/31/2016 07/26/2016  HbA1c 4.8 - 5.6 % 7.2(H) 7.5(H) 7.3(H) 7.0(H) 7.2(H)  Microalbumin Not estab mg/dL - - - - -  Micro/Creat Ratio <30 mcg/mg creat - - - - -  Chol <200 mg/dL - - - 188 -  HDL >50 mg/dL - - - 107 -  Calc LDL <100 mg/dL - - - 73 -  Triglycerides <150 mg/dL - - - 39 -  Creatinine 0.44 - 1.00 mg/dL 1.09(H) 1.15(H) 1.53(H) 1.48(H) 1.27(H)   BP/Weight 10/23/2017 10/01/2017 09/06/2017 08/30/2017 05/31/2017 05/17/2017 02/09/8184  Systolic BP 909 311 216 244 695 072 257  Diastolic BP 74 60 84 72 75 64 64  Wt. (Lbs) 131 130.75 131 131 131 130 133.25  BMI 23.96 24.3 24.75 24.75 24.75 24.56 25.18   Foot/eye exam completion dates Latest Ref Rng & Units 03/19/2017 01/25/2016  Eye Exam No Retinopathy - -  Foot Form Completion - Done Done

## 2017-10-26 NOTE — Assessment & Plan Note (Signed)
C/o 3 month h/o RLQ pain and distension, has h/o tubular adenomas of colon, transverse x 2 and also thickened endometrium, referred for abdominal and pelvic scan and also to gyne and GI asap

## 2017-11-05 ENCOUNTER — Encounter: Payer: Self-pay | Admitting: Obstetrics and Gynecology

## 2017-11-05 ENCOUNTER — Ambulatory Visit: Payer: PPO | Admitting: Obstetrics and Gynecology

## 2017-11-05 VITALS — BP 136/76 | HR 64 | Ht 61.5 in | Wt 131.0 lb

## 2017-11-05 DIAGNOSIS — R1031 Right lower quadrant pain: Secondary | ICD-10-CM

## 2017-11-05 DIAGNOSIS — N814 Uterovaginal prolapse, unspecified: Secondary | ICD-10-CM | POA: Diagnosis not present

## 2017-11-05 NOTE — Progress Notes (Signed)
[facility] Visit  @DATE @            Patient name: Emily Jennings MRN [id number]  Date of birth: 1934-01-25  CC & HPI: CC RLQ pain, now resolved.  Emily Jennings is a 84 y.o. female presenting today for constant RLQ pain that has been occurring for 3 months. She denies any vaginal bleeding, trouble with bowel movements, or recent surgeries. No other associated symptoms noted. She saw Dr. Moshe Cipro on 10/26/17 for the same issue, and has been referred to office by her. She is not having any pain today.No alleviating factors noted. Patient has not tried any medications  Patient has h/o tubular adenomas of colon transverse x2, and thickened endometrium  ROS:  ROS  (+)RLQ pain (-) fever (-) vaginal bleeding All systems are negative except as noted in the HPI and PMH.   Pertinent History Reviewed:   Reviewed: Medical         Past Medical History:  Diagnosis Date  . Allergy   . Anxiety   . Arthritis   . Cancer [facility]) 2009   breast, carcinoma in situ left  . Carotid stenosis    11/16/2005  mild plaque formation and stenosis proximal right ECA  . Cataract   . Complication of anesthesia   . Coronary artery disease    cardiac catheterization on 03/20/2006  LAD mid 40% stenosis, left circumflex mild 40% stenosis, RCA mid-vessel 40% to 50% lesion   EF 60%  . Diabetes mellitus   . GERD (gastroesophageal reflux disease)   . Hernia, inguinal    left  . Hyperglycemia   . Hypertension   . Low blood potassium   . Non-insulin dependent type 2 diabetes mellitus ([place])   . Osteoporosis   . Shortness of breath    2D Echocardiogram 01/26/2009   EF of greater than 55%, mild MR, mild TR, normal ventricular function  . Ventricular tachycardia, non-sustained ([facility])    developed during stress test 02/08/2006, spontaneously aborted, mild reversible apical defect                              Surgical Hx:    Past Surgical History:  Procedure Laterality Date  . BREAST LUMPECTOMY     Left breast  2009  . CATARACT EXTRACTION W/PHACO Left 10/28/2014   Procedure: PHACO EMULSION CATARACT EXTRACTION WITH INTRAOCULAR LENS IMPLANT LEFT EYE (IOC);  Surgeon: Marylynn Pearson, MD;  Location: [place];  Service: Ophthalmology;  Laterality: Left;  . COLONOSCOPY    . cyst removed from left foot     Medications: Reviewed & Updated - see associated section                       Current Outpatient Medications:  .  acetaminophen (TYLENOL) 500 MG tablet, Take 500 mg by mouth every 6 (six) hours as needed., Disp: , Rfl:  .  alendronate (FOSAMAX) 70 MG tablet, TAKE 1 TABLET BY MOUTH EVERY 7 DAYS. TAKE WITH A FULL GLASS OF WATER ON AN EMPTY STOMACH, Disp: 12 tablet, Rfl: 1 .  amLODipine (NORVASC) 10 MG tablet, Take 1 tablet (10 mg total) by mouth daily., Disp: 90 tablet, Rfl: 1 .  B-D INS SYRINGE 0.5CC/31GX5/16 31G X 5/16" 0.5 ML MISC, 1 EACH BY DOES NOT APPLY ROUTE 2 (TWO) TIMES DAILY., Disp: 100 each, Rfl: 3 .  B-D ULTRAFINE III SHORT PEN 31G X 8  MM MISC, USE AS DIRECTED, Disp: 100 each, Rfl: 3 .  blood glucose meter kit and supplies KIT, One touch Ultra. Use up to two times daily as directed. (FOR ICD E11.65), Disp: 1 each, Rfl: 0 .  calcium-vitamin D (OSCAL WITH D) 500-200 MG-UNIT per tablet, Take 1 tablet by mouth daily with breakfast. , Disp: , Rfl:  .  losartan (COZAAR) 50 MG tablet, Take 1 tablet (50 mg total) by mouth daily., Disp: 90 tablet, Rfl: 3 .  metoprolol tartrate (LOPRESSOR) 50 MG tablet, Take 1 tablet (50 mg total) by mouth 2 (two) times daily., Disp: 180 tablet, Rfl: 1 .  Multiple Vitamins-Minerals (MULTIVITAMIN WITH IRON-MINERALS) liquid, Take by mouth daily., Disp: , Rfl:  .  NOVOLOG MIX 70/30 (70-30) 100 UNIT/ML injection, INJECT 15 UNITS WITH BREAKFAST AND 10 UNITS WITH SUPPER WHEN BLOOD GLUCOSE IS ABOVE 90., Disp: 30 mL, Rfl: 2 .  omega-3 acid ethyl esters (LOVAZA) 1 g capsule, Take by mouth 2 (two) times daily., Disp: , Rfl:  .  ONE TOUCH ULTRA TEST test strip, USE AS INSTRUCTED 4 X DAILY.  E11.65, Disp: 150 each, Rfl: 5 .  ONETOUCH DELICA LANCETS 07H MISC, USE TO OBTAIN A BLOOD SPECIMEN 2 TIMES A DAY, Disp: 100 each, Rfl: 4 .  potassium chloride SA (KLOR-CON M20) 20 MEQ tablet, Take 1 tablet (20 mEq total) by mouth 2 (two) times daily., Disp: 180 tablet, Rfl: 1 .  spironolactone (ALDACTONE) 25 MG tablet, Take 1 tablet (25 mg total) by mouth 2 (two) times daily., Disp: 180 tablet, Rfl: 1 .  albuterol (PROVENTIL HFA;VENTOLIN HFA) 108 (90 Base) MCG/ACT inhaler, Inhale 1-2 puffs into the lungs every 6 (six) hours as needed for wheezing or shortness of breath. (Patient not taking: Reported on 11/05/2017), Disp: 18 g, Rfl: 0 .  azelastine (ASTELIN) 0.1 % nasal spray, Place 2 sprays into both nostrils 2 (two) times daily. Use in each nostril as directed (Patient not taking: Reported on 11/05/2017), Disp: 30 mL, Rfl: 12 .  loratadine (ALLERGY RELIEF) 10 MG tablet, Take 10 mg by mouth daily as needed for allergies. , Disp: , Rfl:  .  montelukast (SINGULAIR) 10 MG tablet, Take 1 tablet (10 mg total) by mouth at bedtime. (Patient not taking: Reported on 11/05/2017), Disp: 90 tablet, Rfl: 3   Social History: Reviewed -  reports that  has never smoked. she has never used smokeless tobacco.  Objective Findings:  Vitals: Blood pressure 136/76, pulse 64, height 5' 1.5" (1.562 m), weight 131 lb (59.4 kg).  PHYSICAL EXAMINATION General appearance - alert, well appearing, and in no distress, oriented to person, place, and time and normal appearing weight Mental status - alert, oriented to person, place, and time, normal mood, behavior, speech, dress, motor activity, and thought processes  miuliptarous cervix  PELVIC External genitalia - normal Vulva - normal Vagina - normal Cervix - multiparous Uterus -2nd degree uterine descensus, would drops down almost to the entrance Adnexa - normal  Rectal exam: negative without mass, lesions or tenderness, stool guaiac negative.   Assessment & Plan:    A:  1.  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 2. No acute pain at this time,   P:  1.  f/u PRN  By signing my name below, I, Izna Ahmed, attest that this documentation has been prepared under the direction and in the presence of Jonnie Kind, MD. Electronically Signed: Jabier Gauss, Medical Scribe. 11/05/17. 3:53 PM.  I personally performed the services  described in this documentation, which was SCRIBED in my presence. The recorded information has been reviewed and considered accurate. It has been edited as necessary during review. Jonnie Kind, MD

## 2017-11-09 ENCOUNTER — Ambulatory Visit (HOSPITAL_COMMUNITY)
Admission: RE | Admit: 2017-11-09 | Discharge: 2017-11-09 | Disposition: A | Discharge status: Home / Self Care | Payer: PPO | Source: Ambulatory Visit | Attending: Family Medicine | Admitting: Family Medicine

## 2017-11-09 DIAGNOSIS — R140 Abdominal distension (gaseous): Secondary | ICD-10-CM | POA: Diagnosis present

## 2017-11-09 DIAGNOSIS — R634 Abnormal weight loss: Secondary | ICD-10-CM

## 2017-11-09 DIAGNOSIS — I700 Atherosclerosis of aorta: Secondary | ICD-10-CM | POA: Diagnosis not present

## 2017-11-09 DIAGNOSIS — N281 Cyst of kidney, acquired: Secondary | ICD-10-CM | POA: Diagnosis not present

## 2017-11-09 DIAGNOSIS — R109 Unspecified abdominal pain: Secondary | ICD-10-CM | POA: Diagnosis not present

## 2017-11-09 DIAGNOSIS — D259 Leiomyoma of uterus, unspecified: Secondary | ICD-10-CM | POA: Insufficient documentation

## 2017-11-09 LAB — POCT I-STAT CREATININE: CREATININE: 1.1 mg/dL — AB (ref 0.44–1.00)

## 2017-11-09 MED ORDER — IOPAMIDOL (ISOVUE-300) INJECTION 61%
100.0000 mL | Freq: Once | INTRAVENOUS | Status: AC | PRN
  Administered 2017-11-09: 100 mL via INTRAVENOUS

## 2017-11-27 DIAGNOSIS — E1122 Type 2 diabetes mellitus with diabetic chronic kidney disease: Secondary | ICD-10-CM | POA: Diagnosis not present

## 2017-11-27 DIAGNOSIS — Z794 Long term (current) use of insulin: Secondary | ICD-10-CM | POA: Diagnosis not present

## 2017-11-27 DIAGNOSIS — N183 Chronic kidney disease, stage 3 (moderate): Secondary | ICD-10-CM | POA: Diagnosis not present

## 2017-11-28 ENCOUNTER — Ambulatory Visit (INDEPENDENT_AMBULATORY_CARE_PROVIDER_SITE_OTHER): Payer: PPO | Admitting: "Endocrinology

## 2017-11-28 ENCOUNTER — Encounter: Payer: Self-pay | Admitting: "Endocrinology

## 2017-11-28 VITALS — BP 132/81 | HR 71 | Ht 61.5 in | Wt 132.0 lb

## 2017-11-28 DIAGNOSIS — N183 Chronic kidney disease, stage 3 unspecified: Secondary | ICD-10-CM

## 2017-11-28 DIAGNOSIS — E1122 Type 2 diabetes mellitus with diabetic chronic kidney disease: Secondary | ICD-10-CM | POA: Diagnosis not present

## 2017-11-28 DIAGNOSIS — Z794 Long term (current) use of insulin: Secondary | ICD-10-CM | POA: Diagnosis not present

## 2017-11-28 DIAGNOSIS — I10 Essential (primary) hypertension: Secondary | ICD-10-CM | POA: Diagnosis not present

## 2017-11-28 LAB — HEMOGLOBIN A1C
EAG (MMOL/L): 9.7 (calc)
Hgb A1c MFr Bld: 7.7 % of total Hgb — ABNORMAL HIGH (ref <5.7)
Mean Plasma Glucose: 174 (calc)

## 2017-11-28 LAB — COMPLETE METABOLIC PANEL WITH GFR
AG RATIO: 1.7 (calc) (ref 1.0–2.5)
ALT: 12 U/L (ref 6–29)
AST: 17 U/L (ref 10–35)
Albumin: 4.3 g/dL (ref 3.6–5.1)
Alkaline phosphatase (APISO): 67 U/L (ref 33–130)
BUN/Creatinine Ratio: 22 (calc) (ref 6–22)
BUN: 27 mg/dL — ABNORMAL HIGH (ref 7–25)
CALCIUM: 9.9 mg/dL (ref 8.6–10.4)
CO2: 27 mmol/L (ref 20–32)
Chloride: 103 mmol/L (ref 98–110)
Creat: 1.24 mg/dL — ABNORMAL HIGH (ref 0.60–0.88)
GFR, EST AFRICAN AMERICAN: 47 mL/min/{1.73_m2} — AB (ref >=60)
GFR, EST NON AFRICAN AMERICAN: 41 mL/min/{1.73_m2} — AB (ref >=60)
Globulin: 2.5 g/dL (calc) (ref 1.9–3.7)
Glucose, Bld: 80 mg/dL (ref 65–99)
POTASSIUM: 4.7 mmol/L (ref 3.5–5.3)
Sodium: 141 mmol/L (ref 135–146)
TOTAL PROTEIN: 6.8 g/dL (ref 6.1–8.1)
Total Bilirubin: 0.6 mg/dL (ref 0.2–1.2)

## 2017-11-28 NOTE — Progress Notes (Signed)
Subjective:    Patient ID: Emily Jennings, female    DOB: 1933/11/01,    Past Medical History:  Diagnosis Date  . Allergy   . Anxiety   . Arthritis   . Cancer [facility]) 2009   breast, carcinoma in situ left  . Carotid stenosis    11/16/2005  mild plaque formation and stenosis proximal right ECA  . Cataract   . Complication of anesthesia   . Coronary artery disease    cardiac catheterization on 03/20/2006  LAD mid 40% stenosis, left circumflex mild 40% stenosis, RCA mid-vessel 40% to 50% lesion   EF 60%  . Diabetes mellitus   . GERD (gastroesophageal reflux disease)   . Hernia, inguinal    left  . Hyperglycemia   . Hypertension   . Low blood potassium   . Non-insulin dependent type 2 diabetes mellitus ([place])   . Osteoporosis   . Shortness of breath    2D Echocardiogram 01/26/2009   EF of greater than 55%, mild MR, mild TR, normal ventricular function  . Ventricular tachycardia, non-sustained (HCC)    developed during stress test 02/08/2006, spontaneously aborted, mild reversible apical defect   Past Surgical History:  Procedure Laterality Date  . BREAST LUMPECTOMY     Left breast 2009  . CATARACT EXTRACTION W/PHACO Left 10/28/2014   Procedure: PHACO EMULSION CATARACT EXTRACTION WITH INTRAOCULAR LENS IMPLANT LEFT EYE (IOC);  Surgeon: Marylynn Pearson, MD;  Location: [facility];  Service: Ophthalmology;  Laterality: Left;  . COLONOSCOPY    . cyst removed from left foot     Social History   Socioeconomic History  . Marital status: Married    Spouse name: None  . Number of children: None  . Years of education: na  . Highest education level: None  Social Needs  . Financial resource strain: Not hard at all  . Food insecurity - worry: Never true  . Food insecurity - inability: Never true  . Transportation needs - medical: No  . Transportation needs - non-medical: No  Occupational History  . Occupation: retired    Fish farm manager: retired  Tobacco Use  . Smoking status: Never Smoker  .  Smokeless tobacco: Never Used  Substance and Sexual Activity  . Alcohol use: No  . Drug use: No  . Sexual activity: Not Currently    Birth control/protection: Post-menopausal  Other Topics Concern  . None  Social History Narrative  . None   Outpatient Encounter Medications as of 11/28/2017  Medication Sig  . acetaminophen (TYLENOL) 500 MG tablet Take 500 mg by mouth every 6 (six) hours as needed.  Marland Kitchen albuterol (PROVENTIL HFA;VENTOLIN HFA) 108 (90 Base) MCG/ACT inhaler Inhale 1-2 puffs into the lungs every 6 (six) hours as needed for wheezing or shortness of breath. (Patient not taking: Reported on 11/05/2017)  . alendronate (FOSAMAX) 70 MG tablet TAKE 1 TABLET BY MOUTH EVERY 7 DAYS. TAKE WITH A FULL GLASS OF WATER ON AN EMPTY STOMACH  . amLODipine (NORVASC) 10 MG tablet Take 1 tablet (10 mg total) by mouth daily.  Marland Kitchen azelastine (ASTELIN) 0.1 % nasal spray Place 2 sprays into both nostrils 2 (two) times daily. Use in each nostril as directed (Patient not taking: Reported on 11/05/2017)  . B-D INS SYRINGE 0.5CC/31GX5/16 31G X 5/16" 0.5 ML MISC 1 EACH BY DOES NOT APPLY ROUTE 2 (TWO) TIMES DAILY.  Marland Kitchen B-D ULTRAFINE III SHORT PEN 31G X 8 MM MISC USE AS DIRECTED  . blood glucose meter kit  and supplies KIT One touch Ultra. Use up to two times daily as directed. (FOR ICD E11.65)  . calcium-vitamin D (OSCAL WITH D) 500-200 MG-UNIT per tablet Take 1 tablet by mouth daily with breakfast.   . loratadine (ALLERGY RELIEF) 10 MG tablet Take 10 mg by mouth daily as needed for allergies.   Marland Kitchen losartan (COZAAR) 50 MG tablet Take 1 tablet (50 mg total) by mouth daily.  . metoprolol tartrate (LOPRESSOR) 50 MG tablet Take 1 tablet (50 mg total) by mouth 2 (two) times daily.  . montelukast (SINGULAIR) 10 MG tablet Take 1 tablet (10 mg total) by mouth at bedtime. (Patient not taking: Reported on 11/05/2017)  . Multiple Vitamins-Minerals (MULTIVITAMIN WITH IRON-MINERALS) liquid Take by mouth daily.  Marland Kitchen NOVOLOG MIX 70/30  (70-30) 100 UNIT/ML injection INJECT 15 UNITS WITH BREAKFAST AND 10 UNITS WITH SUPPER WHEN BLOOD GLUCOSE IS ABOVE 90.  . omega-3 acid ethyl esters (LOVAZA) 1 g capsule Take by mouth 2 (two) times daily.  . ONE TOUCH ULTRA TEST test strip USE AS INSTRUCTED 4 X DAILY. E11.65  . ONETOUCH DELICA LANCETS 25K MISC USE TO OBTAIN A BLOOD SPECIMEN 2 TIMES A DAY  . potassium chloride SA (KLOR-CON M20) 20 MEQ tablet Take 1 tablet (20 mEq total) by mouth 2 (two) times daily.  Marland Kitchen spironolactone (ALDACTONE) 25 MG tablet Take 1 tablet (25 mg total) by mouth 2 (two) times daily.   No facility-administered encounter medications on file as of 11/28/2017.    ALLERGIES: Allergies  Allergen Reactions  . Benadryl [Diphenhydramine Hcl] Hypertension  . Citalopram     Pt unsure  . Metformin And Related Diarrhea  . Tramadol Other (See Comments)    Felt light headed and dizzy  . Tylenol [Acetaminophen] Other (See Comments)    Patient stated tylenol makes her "feel high"   VACCINATION STATUS: Immunization History  Administered Date(s) Administered  . Influenza Split 07/17/2011  . Influenza Whole 06/18/2007, 07/29/2009, 05/16/2013  . Influenza,inj,Quad PF,6+ Mos 05/19/2014, 08/22/2016, 05/17/2017  . Pneumococcal Conjugate-13 03/24/2014  . Pneumococcal Polysaccharide-23 05/20/2008  . Td 06/18/2002  . Tdap 07/17/2011    Diabetes  She presents for her follow-up diabetic visit. She has type 2 diabetes mellitus. Onset time: She was diagnosed at approximate age of 47 years. Her disease course has been improving. There are no hypoglycemic associated symptoms. Pertinent negatives for hypoglycemia include no confusion, headaches, pallor or seizures. There are no diabetic associated symptoms. Pertinent negatives for diabetes include no chest pain, no fatigue, no polydipsia, no polyphagia and no polyuria. There are no hypoglycemic complications. Symptoms are improving. Diabetic complications include nephropathy. Risk  factors for coronary artery disease include diabetes mellitus, dyslipidemia, hypertension and sedentary lifestyle. Current diabetic treatment includes insulin injections. She is compliant with treatment some of the time. Her weight is stable. She is following a generally unhealthy diet. She has had a previous visit with a dietitian. She never participates in exercise. Her home blood glucose trend is fluctuating minimally. Her breakfast blood glucose range is generally 140-180 mg/dl. Her lunch blood glucose range is generally 140-180 mg/dl. Her dinner blood glucose range is generally 140-180 mg/dl. Her overall blood glucose range is 140-180 mg/dl. An ACE inhibitor/angiotensin II receptor blocker is being taken.  Hyperlipidemia  This is a chronic problem. The current episode started more than 1 year ago. Pertinent negatives include no chest pain, myalgias or shortness of breath. Risk factors for coronary artery disease include diabetes mellitus, dyslipidemia and a sedentary lifestyle.  Hypertension  This is a chronic problem. The current episode started more than 1 year ago. Pertinent negatives include no chest pain, headaches, palpitations or shortness of breath. Risk factors for coronary artery disease include diabetes mellitus, dyslipidemia and sedentary lifestyle. Past treatments include angiotensin blockers.      Review of Systems  Constitutional: Negative for fatigue and unexpected weight change.  HENT: Negative for trouble swallowing and voice change.   Eyes: Negative for visual disturbance.  Respiratory: Negative for cough, shortness of breath and wheezing.   Cardiovascular: Negative for chest pain, palpitations and leg swelling.  Gastrointestinal: Negative for diarrhea, nausea and vomiting.  Endocrine: Negative for cold intolerance, heat intolerance, polydipsia, polyphagia and polyuria.  Musculoskeletal: Negative for arthralgias and myalgias.  Skin: Negative for color change, pallor, rash and  wound.  Neurological: Negative for seizures and headaches.  Psychiatric/Behavioral: Negative for confusion and suicidal ideas.    Objective:    BP 132/81   Pulse 71   Ht 5' 1.5" (1.562 m)   Wt 132 lb (59.9 kg)   BMI 24.54 kg/m   Wt Readings from Last 3 Encounters:  11/28/17 132 lb (59.9 kg)  11/05/17 131 lb (59.4 kg)  10/23/17 131 lb (59.4 kg)    Physical Exam  Constitutional: She is oriented to person, place, and time. She appears well-developed.  HENT:  Head: Normocephalic and atraumatic.  Eyes: EOM are normal.  Neck: Normal range of motion. Neck supple. No tracheal deviation present. No thyromegaly present.  Cardiovascular: Normal rate and regular rhythm.  Pulmonary/Chest: Effort normal and breath sounds normal.  Abdominal: Soft. Bowel sounds are normal. There is no tenderness. There is no guarding.  Musculoskeletal: Normal range of motion. She exhibits no edema.  Neurological: She is alert and oriented to person, place, and time. She has normal reflexes. No cranial nerve deficit. Coordination normal.  Skin: Skin is warm and dry. No rash noted. No erythema. No pallor.  Psychiatric: She has a normal mood and affect. Judgment normal.    Results for orders placed or performed during the hospital encounter of 11/09/17  I-STAT creatinine  Result Value Ref Range   Creatinine, Ser 1.10 (H) 0.44 - 1.00 mg/dL   Complete Blood Count (Most recent): Lab Results  Component Value Date   WBC 5.9 05/16/2017   HGB 12.9 05/16/2017   HCT 40.1 05/16/2017   MCV 93.9 05/16/2017   PLT 331 05/16/2017   Chemistry (most recent): Lab Results  Component Value Date   NA 141 11/27/2017   K 4.7 11/27/2017   CL 103 11/27/2017   CO2 27 11/27/2017   BUN 27 (H) 11/27/2017   CREATININE 1.24 (H) 11/27/2017   Diabetic Labs (most recent): Lab Results  Component Value Date   HGBA1C 7.7 (H) 11/27/2017   HGBA1C 7.2 (H) 08/29/2017   HGBA1C 7.5 (H) 05/16/2017   Lipid profile (most  recent): Lab Results  Component Value Date   TRIG 39 10/31/2016   CHOL 188 10/31/2016     Assessment & Plan:   1. Type 2 diabetes mellitus with stage 3 chronic kidney disease, with long-term current use of insulin   Patient came with Improving blood glucose profile .  No major hypoglycemic, her recent A1c is acceptable at  7.7 %, generally improved from  9.2 %. Recent labs reviewed.  Patient remains at a high risk for more acute and chronic complications of diabetes which include CAD, CVA, CKD, retinopathy, and neuropathy. These are all discussed in detail with the patient. I have  re-counseled the patient on diet management and weight loss, by adopting a carbohydrate restricted / protein rich  Diet.  -  Suggestion is made for her to avoid simple carbohydrates  from her diet including Cakes, Sweet Desserts / Pastries, Ice Cream, Soda (diet and regular), Sweet Tea, Candies, Chips, Cookies, Store Bought Juices, Alcohol in Excess of  1-2 drinks a day, Artificial Sweeteners, and "Sugar-free" Products. This will help patient to have stable blood glucose profile and potentially avoid unintended weight gain.  I have approached patient to continue on  intensive monitoring of blood glucose and insulin therapy, and patient agrees.   - She has done fairly well on premixed insulin, I will continue on the same regimen. - I had a long discussion with her specifically to avoid injecting insulin when pre-meal blood glucose levels are below 90.   -  I will continue with NovoLog 70/30  15 units  with breakfast  and 10 units with supper when pre-meal glucose is above 90 mg/dL . - She is warned not to inject insulin without proper monitoring of blood glucose. She is advised to continue  strict monitoring of blood glucose before meals and at bedtime.  -Patient is encouraged to call clinic for blood glucose levels less than 70 or above 300 mg /dl.  -Target numbers for A1c, LDL, HDL, Triglycerides, Waist  Circumference were discussed in detail.  2) HTN: controlled. She is advised to  Continue current medications including ACEI. 3. Lipids: Controlled, LDL 73 . She is not on statins.  4)  Weight/Diet: CDE consult in progress, exercise, and carbs information provided.   4) Chronic Care:  -Patient is on ARB  medications and encouraged to continue to follow up with Ophthalmology, Podiatrist at least yearly or according to recommendations, and advised to stay away from smoking. I have recommended yearly flu vaccine and pneumonia vaccination at least every 5 years; and  sleep for at least 7 hours a day.  - Time spent with the patient: 25 min, of which >50% was spent in reviewing her blood glucose logs , discussing her hypo- and hyper-glycemic episodes, reviewing her current and  previous labs and insulin doses and developing a plan to avoid hypo- and hyper-glycemia. Please refer to Patient Instructions for Blood Glucose Monitoring and Insulin/Medications Dosing Guide"  in media tab for additional information. Christain Hewitt Shorts participated in the discussions, expressed understanding, and voiced agreement with the above plans.  All questions were answered to her satisfaction. she is encouraged to contact clinic should she have any questions or concerns prior to her return visit.    Follow up plan: Return in about 4 months (around 03/30/2018) for meter, and logs.  Glade Lloyd, MD Phone: [phone]  Fax: [phone]  This note was partially dictated with voice recognition software. Similar sounding words can be transcribed inadequately or may not  be corrected upon review.  11/28/2017, 9:05 PM

## 2017-11-29 ENCOUNTER — Other Ambulatory Visit: Payer: Self-pay | Admitting: Family Medicine

## 2017-11-30 LAB — COMPLETE METABOLIC PANEL WITH GFR

## 2017-11-30 LAB — HEMOGLOBIN A1C

## 2017-12-04 ENCOUNTER — Encounter: Payer: Self-pay | Admitting: Family Medicine

## 2017-12-04 ENCOUNTER — Ambulatory Visit (INDEPENDENT_AMBULATORY_CARE_PROVIDER_SITE_OTHER): Payer: PPO | Admitting: Family Medicine

## 2017-12-04 VITALS — BP 160/70 | HR 85 | Resp 16 | Ht 61.5 in | Wt 128.1 lb

## 2017-12-04 DIAGNOSIS — Z794 Long term (current) use of insulin: Secondary | ICD-10-CM | POA: Diagnosis not present

## 2017-12-04 DIAGNOSIS — I10 Essential (primary) hypertension: Secondary | ICD-10-CM | POA: Diagnosis not present

## 2017-12-04 DIAGNOSIS — J3089 Other allergic rhinitis: Secondary | ICD-10-CM

## 2017-12-04 DIAGNOSIS — N183 Chronic kidney disease, stage 3 unspecified: Secondary | ICD-10-CM

## 2017-12-04 DIAGNOSIS — E1122 Type 2 diabetes mellitus with diabetic chronic kidney disease: Secondary | ICD-10-CM | POA: Diagnosis not present

## 2017-12-04 MED ORDER — FLUTICASONE PROPIONATE 50 MCG/ACT NA SUSP: 1.0000 | Freq: Every day | NASAL | 1 refills | Status: DC

## 2017-12-04 MED ORDER — MONTELUKAST SODIUM 10 MG PO TABS: 10.0000 mg | ORAL_TABLET | Freq: Every day | ORAL | 1 refills | Status: DC

## 2017-12-04 MED ORDER — CHLORPHENIRAMINE MALEATE 4 MG PO TABS: ORAL_TABLET | ORAL | 0 refills | Status: DC

## 2017-12-04 NOTE — Patient Instructions (Addendum)
Physical exam with MD end August  Return in 1 week for nurse to re check blood pressure, you need to take your medication EVERY day and night  We will send your urine for testing g and add your cholesterol blood test on to recent labs   For allergies, NEW, montelukast 1 daily  Flonase spray once daily  Chlorpheniramine tablet use only if you have excess runny nose  It is important that you exercise regularly at least 30 minutes 5 times a week. If you develop chest pain, have severe difficulty breathing, or feel very tired, stop exercising immediately and seek medical attention  Thank you  for choosing [facility]. We consider it a privelige to serve you.  Delivering excellent health care in a caring and  compassionate way is our goal.  Partnering with you,  so that together we can achieve this goal is our strategy.

## 2017-12-04 NOTE — Progress Notes (Signed)
Emily Jennings     MRN: [id number]      DOB: 1933-11-26   HPI Emily Jennings is here for follow up and re-evaluation of chronic medical conditions, medication management and review of any available recent lab and radiology data.  Preventive health is updated, specifically  Cancer screening and Immunization.   Questions or concerns regarding consultations or procedures which the PT has had in the interim are  addressed.  2 day h/o clear runny nose with excess sneezing, no cough, no fever or chills Denies polyuria, polydipsia, blurred vision , or hypoglycemic episodes. Missed BP med yesterday  ROS Denies recent fever or chills. C/o  sinus pressure, nasal congestion, denies  ear pain or sore throat. Denies chest congestion, productive cough or wheezing. Denies chest pains, palpitations and leg swelling Denies abdominal pain, nausea, vomiting,diarrhea or constipation.   Denies dysuria, frequency, hesitancy or incontinence. Denies joint pain, swelling and limitation in mobility. Denies headaches, seizures, numbness, or tingling. Denies depression, anxiety or insomnia. Denies skin break down or rash.   PE  BP (!) 160/70   Pulse 85   Resp 16   Ht 5' 1.5" (1.562 m)   Wt 128 lb 1.9 oz (58.1 kg)   SpO2 95%   BMI 23.82 kg/m   Patient alert and oriented and in no cardiopulmonary distress.  HEENT: No facial asymmetry, EOMI,   oropharynx pink and moist.  Neck supple no JVD, no mass.TM clear, erythema and edema of nasal mucosa, conjunctival injection  Chest: Clear to auscultation bilaterally.  CVS: S1, S2 no murmurs, no S3.Regular rate.  ABD: Soft non tender.   Ext: No edema  MS: Adequate ROM spine, shoulders, hips and knees.  Skin: Intact, no ulcerations or rash noted.  Psych: Good eye contact, normal affect. Memory intact not anxious or depressed appearing.  CNS: CN 2-12 intact, power,  normal throughout.no focal deficits noted.   Assessment & Plan  Essential  hypertension Elevated at visit as pt forgot medication yesterday, will return for re eval DASH diet and commitment to daily physical activity for a minimum of 30 minutes discussed and encouraged, as a part of hypertension management. The importance of attaining a healthy weight is also discussed.  BP/Weight 12/04/2017 11/28/2017 11/05/2017 10/23/2017 10/01/2017 09/06/2017 00/17/4944  Systolic BP 967 591 638 466 599 357 017  Diastolic BP 70 81 76 74 60 84 72  Wt. (Lbs) 128.12 132 131 131 130.75 131 131  BMI 23.82 24.54 24.35 23.96 24.3 24.75 24.75   Generally adequately controlled, no med change    Type 2 diabetes mellitus with stage 3 chronic kidney disease, with long-term current use of insulin [facility]) Emily Jennings is reminded of the importance of commitment to daily physical activity for 30 minutes or more, as able and the need to limit carbohydrate intake to 30 to 60 grams per meal to help with blood sugar control.   The need to take medication as prescribed, test blood sugar as directed, and to call between visits if there is a concern that blood sugar is uncontrolled is also discussed.   Emily Jennings is reminded of the importance of daily foot exam, annual eye examination, and good blood sugar, blood pressure and cholesterol control. Controlled, no change in medication  Managed by endo  Diabetic Labs Latest Ref Rng & Units 12/05/2017 11/27/2017 11/27/2017 11/09/2017 08/29/2017  HbA1c - - CANCELED 7.7(H) - 7.2(H)  Microalbumin Not Estab. ug/mL 1,218.8(H) - - - -  Micro/Creat Ratio 0.0 - 30.0  mg/g creat 783.3(H) - - - -  Chol <200 mg/dL - - - - -  HDL >50 mg/dL - - - - -  Calc LDL <100 mg/dL - - - - -  Triglycerides <150 mg/dL - - - - -  Creatinine 0.60 - 0.88 mg/dL - - 1.24(H) 1.10(H) 1.09(H)   BP/Weight 12/04/2017 11/28/2017 11/05/2017 10/23/2017 10/01/2017 09/06/2017 95/74/7340  Systolic BP 370 964 383 818 403 754 360  Diastolic BP 70 81 76 74 60 84 72  Wt. (Lbs) 128.12 132 131 131 130.75 131 131   BMI 23.82 24.54 24.35 23.96 24.3 24.75 24.75   Foot/eye exam completion dates Latest Ref Rng & Units 03/19/2017 01/25/2016  Eye Exam No Retinopathy - -  Foot Form Completion - Done Done        Allergic rhinitis Uncontrolled needs to commit to daily medication use

## 2017-12-05 ENCOUNTER — Other Ambulatory Visit (HOSPITAL_COMMUNITY)
Admission: RE | Admit: 2017-12-05 | Discharge: 2017-12-05 | Disposition: A | Discharge status: Home / Self Care | Payer: PPO | Source: Other Acute Inpatient Hospital | Attending: Family Medicine | Admitting: Family Medicine

## 2017-12-05 DIAGNOSIS — N183 Chronic kidney disease, stage 3 (moderate): Secondary | ICD-10-CM | POA: Insufficient documentation

## 2017-12-05 DIAGNOSIS — Z794 Long term (current) use of insulin: Secondary | ICD-10-CM | POA: Insufficient documentation

## 2017-12-05 DIAGNOSIS — E1122 Type 2 diabetes mellitus with diabetic chronic kidney disease: Secondary | ICD-10-CM | POA: Diagnosis not present

## 2017-12-06 LAB — MICROALBUMIN / CREATININE URINE RATIO
Creatinine, Urine: 155.6 mg/dL
Microalb Creat Ratio: 783.3 mg/g creat — ABNORMAL HIGH (ref 0.0–30.0)
Microalb, Ur: 1218.8 ug/mL — ABNORMAL HIGH

## 2017-12-10 ENCOUNTER — Other Ambulatory Visit: Payer: Self-pay

## 2017-12-10 MED ORDER — LOSARTAN POTASSIUM 50 MG PO TABS: 50.0000 mg | ORAL_TABLET | Freq: Every day | ORAL | 3 refills | Status: DC

## 2017-12-16 NOTE — Assessment & Plan Note (Signed)
[name] is reminded of the importance of commitment to daily physical activity for 30 minutes or more, as able and the need to limit carbohydrate intake to 30 to 60 grams per meal to help with blood sugar control.   The need to take medication as prescribed, test blood sugar as directed, and to call between visits if there is a concern that blood sugar is uncontrolled is also discussed.   [name] is reminded of the importance of daily foot exam, annual eye examination, and good blood sugar, blood pressure and cholesterol control. Controlled, no change in medication  Managed by endo  Diabetic Labs Latest Ref Rng & Units 12/05/2017 11/27/2017 11/27/2017 11/09/2017 08/29/2017  HbA1c - - CANCELED 7.7(H) - 7.2(H)  Microalbumin Not Estab. ug/mL 1,218.8(H) - - - -  Micro/Creat Ratio 0.0 - 30.0 mg/g creat 783.3(H) - - - -  Chol <200 mg/dL - - - - -  HDL >50 mg/dL - - - - -  Calc LDL <100 mg/dL - - - - -  Triglycerides <150 mg/dL - - - - -  Creatinine 0.60 - 0.88 mg/dL - - 1.24(H) 1.10(H) 1.09(H)   BP/Weight 12/04/2017 11/28/2017 11/05/2017 10/23/2017 10/01/2017 09/06/2017 57/84/6962  Systolic BP 952 841 324 401 027 253 664  Diastolic BP 70 81 76 74 60 84 72  Wt. (Lbs) 128.12 132 131 131 130.75 131 131  BMI 23.82 24.54 24.35 23.96 24.3 24.75 24.75   Foot/eye exam completion dates Latest Ref Rng & Units 03/19/2017 01/25/2016  Eye Exam No Retinopathy - -  Foot Form Completion - Done Done

## 2017-12-16 NOTE — Assessment & Plan Note (Signed)
Elevated at visit as pt forgot medication yesterday, will return for re eval DASH diet and commitment to daily physical activity for a minimum of 30 minutes discussed and encouraged, as a part of hypertension management. The importance of attaining a healthy weight is also discussed.  BP/Weight 12/04/2017 11/28/2017 11/05/2017 10/23/2017 10/01/2017 09/06/2017 62/26/3335  Systolic BP 456 256 389 373 428 768 115  Diastolic BP 70 81 76 74 60 84 72  Wt. (Lbs) 128.12 132 131 131 130.75 131 131  BMI 23.82 24.54 24.35 23.96 24.3 24.75 24.75   Generally adequately controlled, no med change

## 2017-12-16 NOTE — Assessment & Plan Note (Signed)
Uncontrolled needs to commit to daily medication use

## 2017-12-17 ENCOUNTER — Telehealth: Payer: Self-pay | Admitting: Family Medicine

## 2017-12-17 ENCOUNTER — Encounter: Payer: Self-pay | Admitting: Gastroenterology

## 2017-12-17 NOTE — Telephone Encounter (Signed)
Patient states she needs a referral to Dr.Fields for colonoscopy.

## 2017-12-18 ENCOUNTER — Other Ambulatory Visit: Payer: Self-pay | Admitting: Family Medicine

## 2017-12-18 DIAGNOSIS — D126 Benign neoplasm of colon, unspecified: Secondary | ICD-10-CM

## 2017-12-18 NOTE — Progress Notes (Signed)
[facility]  

## 2017-12-18 NOTE — Telephone Encounter (Signed)
Referral is sent , please let her know

## 2017-12-18 NOTE — Telephone Encounter (Signed)
Left message

## 2017-12-18 NOTE — Telephone Encounter (Signed)
Please advise 

## 2017-12-21 ENCOUNTER — Other Ambulatory Visit: Payer: Self-pay | Admitting: "Endocrinology

## 2017-12-25 ENCOUNTER — Other Ambulatory Visit: Payer: Self-pay

## 2017-12-25 ENCOUNTER — Encounter: Payer: Self-pay | Admitting: Family Medicine

## 2017-12-25 ENCOUNTER — Ambulatory Visit (INDEPENDENT_AMBULATORY_CARE_PROVIDER_SITE_OTHER): Payer: PPO | Admitting: Family Medicine

## 2017-12-25 VITALS — BP 152/66 | HR 90 | Temp 99.2°F | Resp 22 | Ht 61.5 in | Wt 125.1 lb

## 2017-12-25 DIAGNOSIS — J3089 Other allergic rhinitis: Secondary | ICD-10-CM | POA: Diagnosis not present

## 2017-12-25 DIAGNOSIS — J069 Acute upper respiratory infection, unspecified: Secondary | ICD-10-CM | POA: Diagnosis not present

## 2017-12-25 DIAGNOSIS — R062 Wheezing: Secondary | ICD-10-CM

## 2017-12-25 MED ORDER — BENZONATATE 100 MG PO CAPS: 100.0000 mg | ORAL_CAPSULE | Freq: Two times a day (BID) | ORAL | 0 refills | Status: DC | PRN

## 2017-12-25 MED ORDER — IPRATROPIUM BROMIDE 0.02 % IN SOLN
0.5000 mg | Freq: Once | RESPIRATORY_TRACT | Status: AC
  Administered 2017-12-25: 0.5 mg via RESPIRATORY_TRACT

## 2017-12-25 MED ORDER — ALBUTEROL SULFATE 108 (90 BASE) MCG/ACT IN AEPB: 2.0000 | INHALATION_SPRAY | Freq: Four times a day (QID) | RESPIRATORY_TRACT | 0 refills | Status: DC | PRN

## 2017-12-25 MED ORDER — PREDNISONE 20 MG PO TABS: 20.0000 mg | ORAL_TABLET | Freq: Two times a day (BID) | ORAL | 0 refills | Status: DC

## 2017-12-25 MED ORDER — AZITHROMYCIN 250 MG PO TABS: ORAL_TABLET | ORAL | 0 refills | Status: DC

## 2017-12-25 MED ORDER — ALBUTEROL SULFATE (2.5 MG/3ML) 0.083% IN NEBU
2.5000 mg | INHALATION_SOLUTION | Freq: Once | RESPIRATORY_TRACT | Status: AC
  Administered 2017-12-25: 2.5 mg via RESPIRATORY_TRACT

## 2017-12-25 NOTE — Progress Notes (Signed)
Chief Complaint  Patient presents with  . Nausea  . chest congestion  . Cough  Usual PCP is Tula Nakayama MD Patient is seen today for an acute visit She has had an upper respiratory infection for 5 days, getting worse.  She has cough with some thick sputum.  She is quite short of breath with wheezing.  She tells me she does not usually have any underlying lung disease such as asthma or COPD.  She has had wheezing with allergies and upper respiratory infections in the past.  She does not have any inhalers at home. She has not taken her temperature but has had some sweats and chills. She has sinus pressure pain, nasal congestion, postnasal drip, scratchy throat.  Mild ear pain.  Headache.  Very tired. She has central chest pain, points to her sternum, with coughing.  No pain with inspiration. No known exposure to illness such as flu or pneumonia. She was just treated for an upper respiratory infection by Dr. Moshe Cipro in February.  She does not feel like she ever completely got better from the last infection, and now is sick again. Chart is reviewed.  She is a well-controlled diabetic.  A1c was 7.7.  Patient Active Problem List   Diagnosis Date Noted  . Uterus descensus 11/05/2017  . Tubular adenoma of colon 10/26/2017  . Thickened endometrium 10/26/2017  . Osteoporosis 07/28/2014  . Uterine fibroid 07/18/2014  . Lump in the groin 06/26/2014  . Diabetic hypertension-nephrosis syndrome ([place]) 03/23/2014  . CKD (chronic kidney disease) stage 3, GFR 30-59 ml/min (HCC) 03/23/2014  . Type 2 diabetes mellitus with stage 3 chronic kidney disease, with long-term current use of insulin ([place]) 09/29/2013  . Allergic rhinitis 01/27/2013  . GERD (gastroesophageal reflux disease) 01/12/2013  . Insomnia 11/16/2011  . CVD (cardiovascular disease) 07/17/2011  . Carcinoma in situ of breast 05/21/2008  . ANXIETY DISORDER, GENERALIZED 07/15/2007  . Essential hypertension 07/15/2007    Outpatient  Encounter Medications as of 12/25/2017  Medication Sig  . acetaminophen (TYLENOL) 500 MG tablet Take 500 mg by mouth every 6 (six) hours as needed.  Marland Kitchen alendronate (FOSAMAX) 70 MG tablet TAKE 1 TABLET BY MOUTH EVERY 7 DAYS. TAKE WITH A FULL GLASS OF WATER ON AN EMPTY STOMACH  . amLODipine (NORVASC) 10 MG tablet Take 1 tablet (10 mg total) by mouth daily.  . B-D ULTRAFINE III SHORT PEN 31G X 8 MM MISC USE AS DIRECTED  . BD INSULIN SYRINGE U/F 31G X 5/16" 0.5 ML MISC USE AS DIRECTED TWICE DAILY  . blood glucose meter kit and supplies KIT One touch Ultra. Use up to two times daily as directed. (FOR ICD E11.65)  . calcium-vitamin D (OSCAL WITH D) 500-200 MG-UNIT per tablet Take 1 tablet by mouth daily with breakfast.   . fluticasone (FLONASE) 50 MCG/ACT nasal spray Place 1 spray into both nostrils daily.  Marland Kitchen KLOR-CON M20 20 MEQ tablet TAKE 1 TABLET BY MOUTH TWICE A DAY  . losartan (COZAAR) 50 MG tablet Take 1 tablet (50 mg total) by mouth daily.  . metoprolol tartrate (LOPRESSOR) 50 MG tablet Take 1 tablet (50 mg total) by mouth 2 (two) times daily.  . montelukast (SINGULAIR) 10 MG tablet Take 1 tablet (10 mg total) by mouth at bedtime.  . Multiple Vitamins-Minerals (MULTIVITAMIN WITH IRON-MINERALS) liquid Take by mouth daily.  Marland Kitchen NOVOLOG MIX 70/30 (70-30) 100 UNIT/ML injection INJECT 15 UNITS WITH BREAKFAST AND 10 UNITS WITH SUPPER WHEN BLOOD GLUCOSE IS ABOVE  90.  . omega-3 acid ethyl esters (LOVAZA) 1 g capsule Take by mouth 2 (two) times daily.  . ONE TOUCH ULTRA TEST test strip USE AS INSTRUCTED 4 X DAILY. E11.65  . ONETOUCH DELICA LANCETS 84O MISC USE TO OBTAIN A BLOOD SPECIMEN 2 TIMES A DAY  . potassium chloride SA (KLOR-CON M20) 20 MEQ tablet Take 1 tablet (20 mEq total) by mouth 2 (two) times daily.  Marland Kitchen spironolactone (ALDACTONE) 25 MG tablet Take 1 tablet (25 mg total) by mouth 2 (two) times daily.  . Albuterol Sulfate (PROAIR RESPICLICK) 962 (90 Base) MCG/ACT AEPB Inhale 2 puffs into the lungs  every 6 (six) hours as needed.  Marland Kitchen azithromycin (ZITHROMAX) 250 MG tablet tad  . benzonatate (TESSALON) 100 MG capsule Take 1 capsule (100 mg total) by mouth 2 (two) times daily as needed for cough.  . predniSONE (DELTASONE) 20 MG tablet Take 1 tablet (20 mg total) by mouth 2 (two) times daily with a meal.   No facility-administered encounter medications on file as of 12/25/2017.     Allergies  Allergen Reactions  . Benadryl [Diphenhydramine Hcl] Hypertension  . Citalopram   . Metformin And Related Diarrhea  . Tramadol Other (See Comments)    Felt light headed and dizzy  . Tylenol [Acetaminophen] Other (See Comments)    Patient stated tylenol makes her "feel high"    Review of Systems  Constitutional: Positive for appetite change, chills and fatigue. Negative for activity change and unexpected weight change.  HENT: Positive for congestion, postnasal drip, rhinorrhea, sinus pressure and sore throat. Negative for dental problem.   Eyes: Negative for redness and visual disturbance.  Respiratory: Positive for cough, shortness of breath and wheezing.   Cardiovascular: Negative for chest pain, palpitations and leg swelling.  Gastrointestinal: Positive for nausea. Negative for diarrhea and vomiting.  Genitourinary: Negative for difficulty urinating and frequency.  Musculoskeletal: Negative for arthralgias and back pain.  Neurological: Positive for headaches. Negative for dizziness and light-headedness.  Psychiatric/Behavioral: Positive for sleep disturbance. Negative for dysphoric mood. The patient is not nervous/anxious.    Physical Exam  Constitutional: She is oriented to person, place, and time. She appears well-developed and well-nourished. She appears distressed.  Appears moderately ill, dyspneic  HENT:  Head: Normocephalic and atraumatic.  Right Ear: External ear normal.  Left Ear: External ear normal.  Mouth/Throat: Oropharynx is clear and moist.  Nasal membranes are erythematous  and congested  Eyes: Pupils are equal, round, and reactive to light. Conjunctivae are normal.  Neck: Normal range of motion.  Cardiovascular: Normal rate, regular rhythm and normal heart sounds.  Pulmonary/Chest: Effort normal. She has wheezes. She has no rales.  Initial exam: Both lungs with extensive inspiratory wheezing, tachypnea Follow-up exam: Few scattered end inspiratory wheezing with anterior rhonchi, no rales  Abdominal: Soft. Bowel sounds are normal. There is no tenderness.  Musculoskeletal: Normal range of motion. She exhibits no edema.  Lymphadenopathy:    She has no cervical adenopathy.  Neurological: She is alert and oriented to person, place, and time.  Psychiatric: She has a normal mood and affect. Her behavior is normal.  Pleasant, amusing conversation    BP (!) 152/66   Pulse 90   Temp 99.2 F (37.3 C) (Oral)   Resp (!) 22   Ht 5' 1.5" (1.562 m)   Wt 125 lb 1.3 oz (56.7 kg)   SpO2 92%   BMI 23.25 kg/m     ASSESSMENT/PLAN:  1. Wheezing Nebulizer treatment given in office -  albuterol (PROVENTIL) (2.5 MG/3ML) 0.083% nebulizer solution 2.5 mg - ipratropium (ATROVENT) nebulizer solution 0.5 mg  2. Seasonal allergic rhinitis due to other allergic trigger  3. Upper respiratory tract infection, unspecified type Treated with 5 days of antibiotics, 5 days of prednisone, Tessalon for cough.   Patient Instructions  Drink plenty of water  Take the tessalon as needed for cough  Use the inhaler as needed for cough  Take the azithromycin antibiotic as directed Take the prednisone for 5 days  Call if not improving in a few days    Raylene Everts, MD

## 2017-12-25 NOTE — Patient Instructions (Signed)
Drink plenty of water  Take the tessalon as needed for cough  Use the inhaler as needed for cough  Take the azithromycin antibiotic as directed Take the prednisone for 5 days  Call if not improving in a few days

## 2018-02-07 ENCOUNTER — Other Ambulatory Visit: Payer: Self-pay | Admitting: "Endocrinology

## 2018-02-13 ENCOUNTER — Other Ambulatory Visit: Payer: Self-pay | Admitting: Family Medicine

## 2018-02-16 ENCOUNTER — Other Ambulatory Visit: Payer: Self-pay | Admitting: Family Medicine

## 2018-02-20 ENCOUNTER — Ambulatory Visit (INDEPENDENT_AMBULATORY_CARE_PROVIDER_SITE_OTHER): Payer: PPO | Admitting: Gastroenterology

## 2018-02-20 ENCOUNTER — Encounter: Payer: Self-pay | Admitting: Gastroenterology

## 2018-02-20 VITALS — BP 144/70 | HR 76 | Ht 61.0 in | Wt 130.2 lb

## 2018-02-20 DIAGNOSIS — Z86010 Personal history of colonic polyps: Secondary | ICD-10-CM

## 2018-02-20 MED ORDER — NA SULFATE-K SULFATE-MG SULF 17.5-3.13-1.6 GM/177ML PO SOLN: 1.0000 | Freq: Once | ORAL | 0 refills | Status: AC

## 2018-02-20 NOTE — Patient Instructions (Signed)
You have been scheduled for a colonoscopy. Please follow written instructions given to you at your visit today.  Please pick up your prep supplies at the pharmacy within the next 1-3 days. If you use inhalers (even only as needed), please bring them with you on the day of your procedure. Your physician has requested that you go to www.startemmi.com and enter the access code given to you at your visit today. This web site gives a general overview about your procedure. However, you should still follow specific instructions given to you by our office regarding your preparation for the procedure.  Normal BMI (Body Mass Index- based on height and weight) is between 23 and 30. Your BMI today is Body mass index is 24.61 kg/m. Marland Kitchen Please consider follow up  regarding your BMI with your Primary Care Provider.  Thank you for choosing me and [facility].  Pricilla Riffle. Dagoberto Ligas., MD., [facility]

## 2018-02-20 NOTE — Progress Notes (Signed)
History of Present Illness: This is an 84 year old female referred by [name], MD for the evaluation of personal history of adenomatous colon polyp.  Colonoscopy in April 2014 showed 2 adenomatous colon polyps and left colon diverticulosis.  She is here to discuss surveillance colonoscopy.  No ongoing digestive complaints.  Denies weight loss, abdominal pain, constipation, diarrhea, change in stool caliber, melena, hematochezia, nausea, vomiting, dysphagia, reflux symptoms, chest pain.    Allergies  Allergen Reactions  . Benadryl [Diphenhydramine Hcl] Hypertension  . Citalopram   . Metformin And Related Diarrhea  . Tramadol Other (See Comments)    Felt light headed and dizzy  . Tylenol [Acetaminophen] Other (See Comments)    Patient stated tylenol makes her "feel high"   Outpatient Medications Prior to Visit  Medication Sig Dispense Refill  . acetaminophen (TYLENOL) 500 MG tablet Take 500 mg by mouth every 6 (six) hours as needed.    . Albuterol Sulfate (PROAIR RESPICLICK) 161 (90 Base) MCG/ACT AEPB Inhale 2 puffs into the lungs every 6 (six) hours as needed. 1 each 0  . alendronate (FOSAMAX) 70 MG tablet TAKE 1 TABLET BY MOUTH EVERY 7 DAYS. TAKE WITH A FULL GLASS OF WATER ON AN EMPTY STOMACH 12 tablet 0  . amLODipine (NORVASC) 10 MG tablet Take 1 tablet (10 mg total) by mouth daily. 90 tablet 1  . aspirin 81 MG chewable tablet Chew by mouth daily.    . B-D ULTRAFINE III SHORT PEN 31G X 8 MM MISC USE AS DIRECTED 100 each 3  . BD INSULIN SYRINGE U/F 31G X 5/16" 0.5 ML MISC USE AS DIRECTED TWICE DAILY 100 each 5  . blood glucose meter kit and supplies KIT One touch Ultra. Use up to two times daily as directed. (FOR ICD E11.65) 1 each 0  . calcium-vitamin D (OSCAL WITH D) 500-200 MG-UNIT per tablet Take 1 tablet by mouth daily with breakfast.     . KLOR-CON M20 20 MEQ tablet TAKE 1 TABLET BY MOUTH TWICE A DAY 180 tablet 0  . losartan (COZAAR) 50 MG tablet Take 1 tablet (50 mg  total) by mouth daily. 90 tablet 3  . metoprolol tartrate (LOPRESSOR) 50 MG tablet Take 1 tablet (50 mg total) by mouth 2 (two) times daily. 180 tablet 1  . NOVOLOG MIX 70/30 (70-30) 100 UNIT/ML injection INJECT 15 UNITS WITH BREAKFAST AND 10 UNITS WITH SUPPER WHEN BLOOD GLUCOSE IS ABOVE 90. 15 mL 2  . omega-3 acid ethyl esters (LOVAZA) 1 g capsule Take by mouth 2 (two) times daily.    . ONE TOUCH ULTRA TEST test strip USE AS INSTRUCTED 4 X DAILY. E11.65 150 each 5  . ONETOUCH DELICA LANCETS 09U MISC USE TO OBTAIN A BLOOD SPECIMEN 2 TIMES A DAY 100 each 4  . potassium chloride SA (KLOR-CON M20) 20 MEQ tablet Take 1 tablet (20 mEq total) by mouth 2 (two) times daily. 180 tablet 1  . spironolactone (ALDACTONE) 25 MG tablet TAKE 1 TABLET (25 MG TOTAL) BY MOUTH 2 (TWO) TIMES DAILY. 180 tablet 0  . azithromycin (ZITHROMAX) 250 MG tablet tad 6 tablet 0  . benzonatate (TESSALON) 100 MG capsule Take 1 capsule (100 mg total) by mouth 2 (two) times daily as needed for cough. 20 capsule 0  . fluticasone (FLONASE) 50 MCG/ACT nasal spray Place 1 spray into both nostrils daily. 16 g 1  . montelukast (SINGULAIR) 10 MG tablet Take 1 tablet (10 mg total) by mouth at  bedtime. 90 tablet 1  . Multiple Vitamins-Minerals (MULTIVITAMIN WITH IRON-MINERALS) liquid Take by mouth daily.    . predniSONE (DELTASONE) 20 MG tablet Take 1 tablet (20 mg total) by mouth 2 (two) times daily with a meal. 10 tablet 0   No facility-administered medications prior to visit.    Past Medical History:  Diagnosis Date  . Allergy   . Anxiety   . Arthritis   . Cancer [facility]) 2009   breast, carcinoma in situ left  . Carotid stenosis    11/16/2005  mild plaque formation and stenosis proximal right ECA  . Cataract   . Complication of anesthesia   . Coronary artery disease    cardiac catheterization on 03/20/2006  LAD mid 40% stenosis, left circumflex mild 40% stenosis, RCA mid-vessel 40% to 50% lesion   EF 60%  . Diabetes mellitus   .  GERD (gastroesophageal reflux disease)   . Hernia, inguinal    left  . Hyperglycemia   . Hypertension   . Low blood potassium   . Non-insulin dependent type 2 diabetes mellitus ([place])   . Osteoporosis   . Shortness of breath    2D Echocardiogram 01/26/2009   EF of greater than 55%, mild MR, mild TR, normal ventricular function  . Ventricular tachycardia, non-sustained (HCC)    developed during stress test 02/08/2006, spontaneously aborted, mild reversible apical defect   Past Surgical History:  Procedure Laterality Date  . BREAST LUMPECTOMY     Left breast 2009  . CATARACT EXTRACTION W/PHACO Left 10/28/2014   Procedure: PHACO EMULSION CATARACT EXTRACTION WITH INTRAOCULAR LENS IMPLANT LEFT EYE (IOC);  Surgeon: Marylynn Pearson, MD;  Location: [place];  Service: Ophthalmology;  Laterality: Left;  . COLONOSCOPY    . cyst removed from left foot     Social History   Socioeconomic History  . Marital status: Married    Spouse name: Not on file  . Number of children: Not on file  . Years of education: na  . Highest education level: Not on file  Occupational History  . 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 Financial resource strain: Not hard at all  . Food insecurity:    Worry: Never true    Inability: Never true  . Transportation needs:    Medical: No    Non-medical: No  Tobacco Use  . Smoking status: Never Smoker  . Smokeless tobacco: Never Used  Substance and Sexual Activity  . Alcohol use: No  . Drug use: No  . Sexual activity: Not Currently    Birth control/protection: Post-menopausal  Lifestyle  . Physical activity:    Days per week: 2 days    Minutes per session: 50 min  . Stress: Not at all  Relationships  . Social connections:    Talks on phone: Twice a week    Gets together: Once a week    Attends religious service: More than 4 times per year    Active member of club or organization: No    Attends meetings of clubs or organizations: Never     Relationship status: Married  Other Topics Concern  . Not on file  Social History Narrative  . Not on file   Family History  Problem Relation Age of Onset  . Hypertension Mother   . Hyperlipidemia Mother   . Stroke Mother   . Cancer Father        pancreatic  . Colon cancer Father   . Heart  disease Brother 50       bypass  . Heart disease Brother 72       bypass  . Arthritis Unknown   . Asthma Unknown   . Diabetes Unknown   . Colon cancer Paternal Aunt   . Esophageal cancer Neg Hx   . Stomach cancer Neg Hx   . Rectal cancer Neg Hx       Review of Systems: Pertinent positive and negative review of systems were noted in the above HPI section. All other review of systems were otherwise negative.   Physical Exam: General: Well developed, well nourished, no acute distress Head: Normocephalic and atraumatic Eyes:  sclerae anicteric, EOMI Ears: Normal auditory acuity Mouth: No deformity or lesions Neck: Supple, no masses or thyromegaly Lungs: Clear throughout to auscultation Heart: Regular rate and rhythm; no murmurs, rubs or bruits Abdomen: Soft, non tender and non distended. No masses, hepatosplenomegaly or hernias noted. Normal Bowel sounds Rectal: Deferred to colonoscopy Musculoskeletal: Symmetrical with no gross deformities  Skin: No lesions on visible extremities Pulses:  Normal pulses noted Extremities: No clubbing, cyanosis, edema or deformities noted Neurological: Alert oriented x 4, grossly nonfocal Cervical Nodes:  No significant cervical adenopathy Inguinal Nodes: No significant inguinal adenopathy Psychological:  Alert and cooperative. Normal mood and affect  Assessment and Recommendations:  1.  Personal history of adenomatous colon polyps.  She is in good health and would like to proceed with surveillance colonoscopy.  Schedule colonoscopy.  The risks (including bleeding, perforation, infection, missed lesions, medication reactions and possible  hospitalization or surgery if complications occur), benefits, and alternatives to colonoscopy with possible biopsy and possible polypectomy were discussed with the patient and they consent to proceed.     cc: [name], MD [address], [place], [place] [address]

## 2018-02-27 ENCOUNTER — Encounter: Payer: Self-pay | Admitting: Gastroenterology

## 2018-03-01 ENCOUNTER — Other Ambulatory Visit: Payer: Self-pay | Admitting: Family Medicine

## 2018-03-12 ENCOUNTER — Encounter: Payer: Self-pay | Admitting: Gastroenterology

## 2018-03-15 ENCOUNTER — Other Ambulatory Visit: Payer: Self-pay | Admitting: "Endocrinology

## 2018-03-29 DIAGNOSIS — E1122 Type 2 diabetes mellitus with diabetic chronic kidney disease: Secondary | ICD-10-CM | POA: Diagnosis not present

## 2018-03-29 DIAGNOSIS — I10 Essential (primary) hypertension: Secondary | ICD-10-CM | POA: Diagnosis not present

## 2018-03-29 DIAGNOSIS — Z794 Long term (current) use of insulin: Secondary | ICD-10-CM | POA: Diagnosis not present

## 2018-03-29 DIAGNOSIS — N183 Chronic kidney disease, stage 3 (moderate): Secondary | ICD-10-CM | POA: Diagnosis not present

## 2018-03-29 LAB — LIPID PANEL
Cholesterol: 214 mg/dL — ABNORMAL HIGH (ref <200)
HDL: 106 mg/dL (ref >50)
LDL CHOLESTEROL (CALC): 98 mg/dL
NON-HDL CHOLESTEROL (CALC): 108 mg/dL (ref <130)
Total CHOL/HDL Ratio: 2 (calc) (ref <5.0)
Triglycerides: 33 mg/dL (ref <150)

## 2018-04-01 ENCOUNTER — Ambulatory Visit: Payer: PPO | Admitting: "Endocrinology

## 2018-04-03 ENCOUNTER — Other Ambulatory Visit: Payer: Self-pay

## 2018-04-03 ENCOUNTER — Ambulatory Visit (INDEPENDENT_AMBULATORY_CARE_PROVIDER_SITE_OTHER): Payer: PPO | Admitting: Family Medicine

## 2018-04-03 ENCOUNTER — Encounter: Payer: Self-pay | Admitting: Family Medicine

## 2018-04-03 VITALS — BP 130/80 | HR 67 | Ht 61.0 in | Wt 130.0 lb

## 2018-04-03 DIAGNOSIS — E1122 Type 2 diabetes mellitus with diabetic chronic kidney disease: Secondary | ICD-10-CM

## 2018-04-03 DIAGNOSIS — N183 Chronic kidney disease, stage 3 (moderate): Secondary | ICD-10-CM

## 2018-04-03 DIAGNOSIS — Z794 Long term (current) use of insulin: Secondary | ICD-10-CM

## 2018-04-03 DIAGNOSIS — Z0000 Encounter for general adult medical examination without abnormal findings: Secondary | ICD-10-CM

## 2018-04-03 NOTE — Patient Instructions (Signed)
F/U with MD end November, call if you need me before  No medication changes at this time  Please get your colonoscopy before you return  Keep active and keep your blood pressure and blood sugar down  Please get toe nails cut with Specialist

## 2018-04-03 NOTE — Progress Notes (Signed)
    [address]     MRN: [id number]      DOB: 1934-06-11  HPI: Patient is in for annual physical exam. No other health concerns are expressed or addressed at the visit. Recent labs, are reviewed. Immunization is reviewed , and  updated if needed.   PE: BP 130/80   Pulse 67   Ht 5\' 1"  (1.549 m)   Wt 130 lb (59 kg)   SpO2 97%   BMI 24.56 kg/m    Pleasant  female, alert and oriented x 3, in no cardio-pulmonary distress. Afebrile. HEENT No facial trauma or asymetry. Sinuses non tender.  Extra occullar muscles intact,. External ears normal, tympanic membranes clear. Oropharynx moist, no exudate. Neck: supple, no adenopathy,JVD or thyromegaly.No bruits.  Chest: Clear to ascultation bilaterally.No crackles or wheezes. Non tender to palpation  Breast: Left lumpectomy,no masses or lumps. No tenderness. No nipple discharge left nipple  inversion.chronic No axillary or supraclavicular adenopathy  Cardiovascular system; Heart sounds normal,  S1 and  S2 ,no S3.  No murmur, or thrill. Apical beat not displaced Peripheral pulses normal.  Abdomen: Soft, non tender, no organomegaly or masses. No bruits. Bowel sounds normal. No guarding, tenderness or rebound.  .  GU: Asymptomatic, no exam incdicated  Musculoskeletal exam: Full ROM of spine, hips , shoulders and knees. No deformity ,swelling or crepitus noted. No muscle wasting or atrophy.   Neurologic: Cranial nerves 2 to 12 intact. Power, tone , normal throughout. No disturbance in gait. No tremor.  Skin: Intact, no ulceration, erythema , scaling or rash noted. Pigmentation normal throughout  Psych; Normal mood and affect. Judgement and concentration normal   Assessment & Plan:  Annual physical exam Annual exam as documented.  Immunization and cancer screening needs are specifically addressed at this visit. Need for colonoscopy is stressed, pt states she will follow through  Type 2 diabetes mellitus with  stage 3 chronic kidney disease, with long-term current use of insulin (HCC) Controlled, no change in medication Managed by endocrinology

## 2018-04-06 ENCOUNTER — Encounter: Payer: Self-pay | Admitting: Family Medicine

## 2018-04-06 NOTE — Assessment & Plan Note (Signed)
Controlled, no change in medication Managed by endocrinology

## 2018-04-06 NOTE — Assessment & Plan Note (Signed)
Annual exam as documented.  Immunization and cancer screening needs are specifically addressed at this visit. Need for colonoscopy is stressed, pt states she will follow through

## 2018-04-15 ENCOUNTER — Ambulatory Visit (INDEPENDENT_AMBULATORY_CARE_PROVIDER_SITE_OTHER): Payer: PPO | Admitting: "Endocrinology

## 2018-04-15 ENCOUNTER — Encounter: Payer: Self-pay | Admitting: "Endocrinology

## 2018-04-15 VITALS — BP 137/70 | HR 71 | Ht 61.0 in | Wt 129.8 lb

## 2018-04-15 DIAGNOSIS — N183 Chronic kidney disease, stage 3 unspecified: Secondary | ICD-10-CM

## 2018-04-15 DIAGNOSIS — I10 Essential (primary) hypertension: Secondary | ICD-10-CM | POA: Diagnosis not present

## 2018-04-15 DIAGNOSIS — Z794 Long term (current) use of insulin: Secondary | ICD-10-CM

## 2018-04-15 DIAGNOSIS — E1122 Type 2 diabetes mellitus with diabetic chronic kidney disease: Secondary | ICD-10-CM | POA: Diagnosis not present

## 2018-04-15 NOTE — Patient Instructions (Signed)

## 2018-04-15 NOTE — Progress Notes (Signed)
Endocrinology follow-up note  Subjective:    Patient ID: Emily Jennings, female    DOB: 02-Jul-1934,    Past Medical History:  Diagnosis Date  . Allergy   . Anxiety   . Arthritis   . Cancer [facility]) 2009   breast, carcinoma in situ left  . Carotid stenosis    11/16/2005  mild plaque formation and stenosis proximal right ECA  . Cataract   . Complication of anesthesia   . Coronary artery disease    cardiac catheterization on 03/20/2006  LAD mid 40% stenosis, left circumflex mild 40% stenosis, RCA mid-vessel 40% to 50% lesion   EF 60%  . Diabetes mellitus   . GERD (gastroesophageal reflux disease)   . Hernia, inguinal    left  . Hyperglycemia   . Hypertension   . Low blood potassium   . Non-insulin dependent type 2 diabetes mellitus ([place])   . Osteoporosis   . Shortness of breath    2D Echocardiogram 01/26/2009   EF of greater than 55%, mild MR, mild TR, normal ventricular function  . Ventricular tachycardia, non-sustained (HCC)    developed during stress test 02/08/2006, spontaneously aborted, mild reversible apical defect   Past Surgical History:  Procedure Laterality Date  . BREAST LUMPECTOMY Left 2009   Left breast 2009  . CATARACT EXTRACTION W/PHACO Left 10/28/2014   Procedure: PHACO EMULSION CATARACT EXTRACTION WITH INTRAOCULAR LENS IMPLANT LEFT EYE (IOC);  Surgeon: Marylynn Pearson, MD;  Location: [facility];  Service: Ophthalmology;  Laterality: Left;  . COLONOSCOPY    . cyst removed from left foot     Social History   Socioeconomic History  . Marital status: Married    Spouse name: Not on file  . Number of children: Not on file  . Years of education: na  . Highest education level: Not on file  Occupational History  . 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 Financial resource strain: Not hard at all  . Food insecurity:    Worry: Never true    Inability: Never true  . Transportation needs:    Medical: No    Non-medical: No  Tobacco Use  . Smoking  status: Never Smoker  . Smokeless tobacco: Never Used  Substance and Sexual Activity  . Alcohol use: No  . Drug use: No  . Sexual activity: Not Currently    Birth control/protection: Post-menopausal  Lifestyle  . Physical activity:    Days per week: 2 days    Minutes per session: 50 min  . Stress: Not at all  Relationships  . Social connections:    Talks on phone: Twice a week    Gets together: Once a week    Attends religious service: More than 4 times per year    Active member of club or organization: No    Attends meetings of clubs or organizations: Never    Relationship status: Married  Other Topics Concern  . Not on file  Social History Narrative  . Not on file   Outpatient Encounter Medications as of 04/15/2018  Medication Sig  . acetaminophen (TYLENOL) 500 MG tablet Take 500 mg by mouth every 6 (six) hours as needed.  Marland Kitchen alendronate (FOSAMAX) 70 MG tablet TAKE 1 TABLET BY MOUTH EVERY 7 DAYS. TAKE WITH A FULL GLASS OF WATER ON AN EMPTY STOMACH  . amLODipine (NORVASC) 10 MG tablet Take 1 tablet (10 mg total) by mouth daily.  Marland Kitchen aspirin 81 MG chewable tablet Chew  by mouth daily.  . B-D ULTRAFINE III SHORT PEN 31G X 8 MM MISC USE AS DIRECTED  . BD INSULIN SYRINGE U/F 31G X 5/16" 0.5 ML MISC USE AS DIRECTED TWICE DAILY  . blood glucose meter kit and supplies KIT One touch Ultra. Use up to two times daily as directed. (FOR ICD E11.65)  . calcium-vitamin D (OSCAL WITH D) 500-200 MG-UNIT per tablet Take 1 tablet by mouth daily with breakfast.   . losartan (COZAAR) 50 MG tablet Take 1 tablet (50 mg total) by mouth daily.  . metoprolol tartrate (LOPRESSOR) 50 MG tablet Take 1 tablet (50 mg total) by mouth 2 (two) times daily.  Marland Kitchen NOVOLOG MIX 70/30 (70-30) 100 UNIT/ML injection INJECT 15 UNITS WITH BREAKFAST AND 10 UNITS WITH SUPPER WHEN BLOOD GLUCOSE IS ABOVE 90.  Marland Kitchen ONE TOUCH ULTRA TEST test strip USE TO TEST BLOOD SUGAR 4 TIMES A DAY (E11.65)  . ONETOUCH DELICA LANCETS 68L MISC USE  TO OBTAIN A BLOOD SPECIMEN 2 TIMES A DAY  . potassium chloride SA (KLOR-CON M20) 20 MEQ tablet Take 1 tablet (20 mEq total) by mouth 2 (two) times daily.  Marland Kitchen spironolactone (ALDACTONE) 25 MG tablet TAKE 1 TABLET (25 MG TOTAL) BY MOUTH 2 (TWO) TIMES DAILY.  Marland Kitchen Albuterol Sulfate (PROAIR RESPICLICK) 275 (90 Base) MCG/ACT AEPB Inhale 2 puffs into the lungs every 6 (six) hours as needed. (Patient not taking: Reported on 04/15/2018)  . omega-3 acid ethyl esters (LOVAZA) 1 g capsule Take by mouth 2 (two) times daily.   No facility-administered encounter medications on file as of 04/15/2018.    ALLERGIES: Allergies  Allergen Reactions  . Benadryl [Diphenhydramine Hcl] Hypertension  . Citalopram   . Metformin And Related Diarrhea  . Tramadol Other (See Comments)    Felt light headed and dizzy  . Tylenol [Acetaminophen] Other (See Comments)    Patient stated tylenol makes her "feel high"   VACCINATION STATUS: Immunization History  Administered Date(s) Administered  . Influenza Split 07/17/2011  . Influenza Whole 06/18/2007, 07/29/2009, 05/16/2013  . Influenza,inj,Quad PF,6+ Mos 05/19/2014, 08/22/2016, 05/17/2017  . Pneumococcal Conjugate-13 03/24/2014  . Pneumococcal Polysaccharide-23 05/20/2008  . Td 06/18/2002  . Tdap 07/17/2011    Diabetes  She presents for her follow-up diabetic visit. She has type 2 diabetes mellitus. Onset time: She was diagnosed at approximate age of 74 years. Her disease course has been stable. There are no hypoglycemic associated symptoms. Pertinent negatives for hypoglycemia include no confusion, headaches, pallor or seizures. There are no diabetic associated symptoms. Pertinent negatives for diabetes include no chest pain, no fatigue, no polydipsia, no polyphagia and no polyuria. There are no hypoglycemic complications. Symptoms are stable. Diabetic complications include nephropathy. Risk factors for coronary artery disease include diabetes mellitus, dyslipidemia,  hypertension and sedentary lifestyle. Current diabetic treatment includes insulin injections. She is compliant with treatment some of the time. Her weight is stable. She is following a generally unhealthy diet. She has had a previous visit with a dietitian. She never participates in exercise. Her home blood glucose trend is fluctuating minimally. Her breakfast blood glucose range is generally 140-180 mg/dl. Her lunch blood glucose range is generally 140-180 mg/dl. Her dinner blood glucose range is generally 140-180 mg/dl. Her overall blood glucose range is 140-180 mg/dl. An ACE inhibitor/angiotensin II receptor blocker is being taken.  Hyperlipidemia  This is a chronic problem. The current episode started more than 1 year ago. Pertinent negatives include no chest pain, myalgias or shortness of breath. Risk factors  for coronary artery disease include diabetes mellitus, dyslipidemia and a sedentary lifestyle.  Hypertension  This is a chronic problem. The current episode started more than 1 year ago. Pertinent negatives include no chest pain, headaches, palpitations or shortness of breath. Risk factors for coronary artery disease include diabetes mellitus, dyslipidemia and sedentary lifestyle. Past treatments include angiotensin blockers.      Review of Systems  Constitutional: Negative for fatigue and unexpected weight change.  HENT: Negative for trouble swallowing and voice change.   Eyes: Negative for visual disturbance.  Respiratory: Negative for cough, shortness of breath and wheezing.   Cardiovascular: Negative for chest pain, palpitations and leg swelling.  Gastrointestinal: Negative for diarrhea, nausea and vomiting.  Endocrine: Negative for cold intolerance, heat intolerance, polydipsia, polyphagia and polyuria.  Musculoskeletal: Negative for arthralgias and myalgias.  Skin: Negative for color change, pallor, rash and wound.  Neurological: Negative for seizures and headaches.   Psychiatric/Behavioral: Negative for confusion and suicidal ideas.    Objective:    BP 137/70 (BP Location: Left Arm, Patient Position: Sitting)   Pulse 71   Ht _0  (1.549 m)   Wt 129 lb 12.8 oz (58.9 kg)   SpO2 97%   BMI 24.53 kg/m   Wt Readings from Last 3 Encounters:  04/15/18 129 lb 12.8 oz (58.9 kg)  04/03/18 130 lb (59 kg)  02/20/18 130 lb 4 oz (59.1 kg)    Physical Exam  Constitutional: She is oriented to person, place, and time. She appears well-developed.  HENT:  Head: Normocephalic and atraumatic.  Eyes: EOM are normal.  Neck: Normal range of motion. Neck supple. No tracheal deviation present. No thyromegaly present.  Cardiovascular: Normal rate and regular rhythm.  Pulmonary/Chest: Effort normal.  Abdominal: There is no tenderness. There is no guarding.  Musculoskeletal: Normal range of motion. She exhibits no edema.  Neurological: She is alert and oriented to person, place, and time. No cranial nerve deficit. Coordination normal.  Skin: No rash noted. No erythema. No pallor.  Psychiatric: She has a normal mood and affect. Judgment normal.    Results for orders placed or performed during the hospital encounter of 12/05/17  Microalbumin / creatinine urine ratio  Result Value Ref Range   Microalb, Ur 1,218.8 (H) Not Estab. ug/mL   Microalb Creat Ratio 783.3 (H) 0.0 - 30.0 mg/g creat   Creatinine, Urine 155.6 Not Estab. mg/dL   Complete Blood Count (Most recent): Lab Results  Component Value Date   WBC 5.9 05/16/2017   HGB 12.9 05/16/2017   HCT 40.1 05/16/2017   MCV 93.9 05/16/2017   PLT 331 05/16/2017   Chemistry (most recent): Lab Results  Component Value Date   NA 141 11/27/2017   K 4.7 11/27/2017   CL 103 11/27/2017   CO2 27 11/27/2017   BUN 27 (H) 11/27/2017   CREATININE 1.24 (H) 11/27/2017   Diabetic Labs (most recent): Lab Results  Component Value Date   HGBA1C 7.7 (H) 11/27/2017   HGBA1C CANCELED 11/27/2017   HGBA1C 7.2 (H) 08/29/2017    Lipid Panel     Component Value Date/Time   CHOL 214 (H) 03/29/2018 0820   TRIG 33 03/29/2018 0820   HDL 106 03/29/2018 0820   CHOLHDL 2.0 03/29/2018 0820   VLDL 8 10/31/2016 0845   LDLCALC 98 03/29/2018 0820     Assessment & Plan:   1. Type 2 diabetes mellitus with stage 3 chronic kidney disease, with long-term current use of insulin   Patient came with  Improving blood glucose profile .  No major hypoglycemic, her recent A1c is acceptable at  7.7 %, generally improved from  9.2 %. Recent labs reviewed.  Patient remains at a high risk for more acute and chronic complications of diabetes which include CAD, CVA, CKD, retinopathy, and neuropathy. These are all discussed in detail with the patient. I have re-counseled the patient on diet management and weight loss, by adopting a carbohydrate restricted / protein rich  Diet.  -  Suggestion is made for her to avoid simple carbohydrates  from her diet including Cakes, Sweet Desserts / Pastries, Ice Cream, Soda (diet and regular), Sweet Tea, Candies, Chips, Cookies, Store Bought Juices, Alcohol in Excess of  1-2 drinks a day, Artificial Sweeteners, and "Sugar-free" Products. This will help patient to have stable blood glucose profile and potentially avoid unintended weight gain.   I have approached patient to continue on  intensive monitoring of blood glucose and insulin therapy, and patient agrees.   - She continued to do fairly well on premixed insulin.  -I had a long discussion with her specifically to avoid injecting insulin without monitoring blood glucose.  -  I will continue with NovoLog 70/30  15 units  with breakfast  and 10 units with supper when pre-meal glucose is above 90 mg/dL . - She is warned not to inject insulin without proper monitoring of blood glucose. She is advised to continue monitoring her blood glucose 4 times a day-before meals and at bedtime.    -Patient is encouraged to call clinic for blood glucose levels  less than 70 or above 300 mg /dl.  -Target numbers for A1c, LDL, HDL, Triglycerides, Waist Circumference were discussed in detail.  2) HTN: controlled. She is advised to  Continue current medications including ACEI. 3. Lipids: Her most recent lipid panel showed LDL at 98.  She has not tolerated statins.  She is advised to stay on omega-3 fatty acids.  4)  Weight/Diet: CDE consult in progress, exercise, and carbs information provided.   4) Chronic Care:  -Patient is on ARB  medications and encouraged to continue to follow up with Ophthalmology, Podiatrist at least yearly or according to recommendations, and advised to stay away from smoking. I have recommended yearly flu vaccine and pneumonia vaccination at least every 5 years; and  sleep for at least 7 hours a day.  - Time spent with the patient: 25 min, of which >50% was spent in reviewing her blood glucose logs , discussing her hypo- and hyper-glycemic episodes, reviewing her current and  previous labs and insulin doses and developing a plan to avoid hypo- and hyper-glycemia. Please refer to Patient Instructions for Blood Glucose Monitoring and Insulin/Medications Dosing Guide"  in media tab for additional information. Emily Jennings participated in the discussions, expressed understanding, and voiced agreement with the above plans.  All questions were answered to her satisfaction. she is encouraged to contact clinic should she have any questions or concerns prior to her return visit.   Follow up plan: Return in about 3 months (around 07/16/2018) for follow up with pre-visit labs, meter, and logs.  Glade Lloyd, MD Phone: [phone]  Fax: [phone]  This note was partially dictated with voice recognition software. Similar sounding words can be transcribed inadequately or may not  be corrected upon review.  04/15/2018, 6:22 PM

## 2018-05-03 ENCOUNTER — Other Ambulatory Visit: Payer: Self-pay | Admitting: "Endocrinology

## 2018-05-12 ENCOUNTER — Other Ambulatory Visit: Payer: Self-pay | Admitting: Family Medicine

## 2018-05-21 ENCOUNTER — Other Ambulatory Visit: Payer: Self-pay | Admitting: "Endocrinology

## 2018-05-29 ENCOUNTER — Other Ambulatory Visit: Payer: Self-pay | Admitting: Family Medicine

## 2018-06-16 ENCOUNTER — Other Ambulatory Visit: Payer: Self-pay | Admitting: Family Medicine

## 2018-06-18 ENCOUNTER — Encounter: Payer: Self-pay | Admitting: Gastroenterology

## 2018-06-26 DIAGNOSIS — H40023 Open angle with borderline findings, high risk, bilateral: Secondary | ICD-10-CM | POA: Diagnosis not present

## 2018-06-26 DIAGNOSIS — H2511 Age-related nuclear cataract, right eye: Secondary | ICD-10-CM | POA: Diagnosis not present

## 2018-07-17 ENCOUNTER — Ambulatory Visit: Payer: PPO | Admitting: "Endocrinology

## 2018-07-17 ENCOUNTER — Encounter: Payer: Self-pay | Admitting: Gastroenterology

## 2018-07-31 ENCOUNTER — Encounter: Payer: Self-pay | Admitting: Gastroenterology

## 2018-08-12 ENCOUNTER — Telehealth: Payer: Self-pay | Admitting: Family Medicine

## 2018-08-12 ENCOUNTER — Ambulatory Visit: Payer: Self-pay | Admitting: Family Medicine

## 2018-08-12 NOTE — Telephone Encounter (Signed)
Missed appt 11/25 at 2pm.  Called pt to re-schedule missed appt. LMOM

## 2018-08-13 ENCOUNTER — Encounter: Payer: Self-pay | Admitting: Family Medicine

## 2018-08-13 ENCOUNTER — Ambulatory Visit (INDEPENDENT_AMBULATORY_CARE_PROVIDER_SITE_OTHER): Payer: PPO | Admitting: Family Medicine

## 2018-08-13 VITALS — BP 138/80 | HR 88 | Resp 16 | Ht 61.0 in | Wt 128.0 lb

## 2018-08-13 DIAGNOSIS — M810 Age-related osteoporosis without current pathological fracture: Secondary | ICD-10-CM

## 2018-08-13 DIAGNOSIS — I2510 Atherosclerotic heart disease of native coronary artery without angina pectoris: Secondary | ICD-10-CM | POA: Diagnosis not present

## 2018-08-13 DIAGNOSIS — I10 Essential (primary) hypertension: Secondary | ICD-10-CM

## 2018-08-13 DIAGNOSIS — J3089 Other allergic rhinitis: Secondary | ICD-10-CM | POA: Diagnosis not present

## 2018-08-13 DIAGNOSIS — Z794 Long term (current) use of insulin: Secondary | ICD-10-CM

## 2018-08-13 DIAGNOSIS — Z23 Encounter for immunization: Secondary | ICD-10-CM | POA: Diagnosis not present

## 2018-08-13 DIAGNOSIS — N183 Chronic kidney disease, stage 3 (moderate): Secondary | ICD-10-CM | POA: Diagnosis not present

## 2018-08-13 DIAGNOSIS — E1122 Type 2 diabetes mellitus with diabetic chronic kidney disease: Secondary | ICD-10-CM

## 2018-08-13 DIAGNOSIS — Z1231 Encounter for screening mammogram for malignant neoplasm of breast: Secondary | ICD-10-CM

## 2018-08-13 MED ORDER — ROSUVASTATIN CALCIUM 5 MG PO TABS: 5.0000 mg | ORAL_TABLET | Freq: Every day | ORAL | 3 refills | Status: DC

## 2018-08-13 MED ORDER — METOPROLOL TARTRATE 50 MG PO TABS: 50.0000 mg | ORAL_TABLET | Freq: Two times a day (BID) | ORAL | 1 refills | Status: DC

## 2018-08-13 MED ORDER — AMLODIPINE BESYLATE 10 MG PO TABS: 10.0000 mg | ORAL_TABLET | Freq: Every day | ORAL | 1 refills | Status: DC

## 2018-08-13 MED ORDER — POTASSIUM CHLORIDE CRYS ER 20 MEQ PO TBCR: 20.0000 meq | EXTENDED_RELEASE_TABLET | Freq: Two times a day (BID) | ORAL | 1 refills | Status: DC

## 2018-08-13 NOTE — Patient Instructions (Addendum)
Wellness with nurse Jan 15 or after  Please schedule her mammogram on a Tuesday if possible in Feb  MD follow  Up in 4.5 months, call if you need me before  Flu vaccine today  CBc, fasting lipid, cmp and EGFR and TSH today and vitamin D   New additional medicine to reduce risk of heart disease , generic crestior 5 mg one at bedtime  Increase Y visit to minimum of 3 / week, and continue to enjoy your family, esp your grandson!

## 2018-08-14 DIAGNOSIS — N183 Chronic kidney disease, stage 3 (moderate): Secondary | ICD-10-CM | POA: Diagnosis not present

## 2018-08-14 DIAGNOSIS — E1122 Type 2 diabetes mellitus with diabetic chronic kidney disease: Secondary | ICD-10-CM | POA: Diagnosis not present

## 2018-08-14 DIAGNOSIS — M810 Age-related osteoporosis without current pathological fracture: Secondary | ICD-10-CM | POA: Diagnosis not present

## 2018-08-14 DIAGNOSIS — I10 Essential (primary) hypertension: Secondary | ICD-10-CM | POA: Diagnosis not present

## 2018-08-14 DIAGNOSIS — I2510 Atherosclerotic heart disease of native coronary artery without angina pectoris: Secondary | ICD-10-CM | POA: Diagnosis not present

## 2018-08-14 DIAGNOSIS — Z794 Long term (current) use of insulin: Secondary | ICD-10-CM | POA: Diagnosis not present

## 2018-08-14 NOTE — Assessment & Plan Note (Signed)
No current flare, best contol noted with as needed chlorpheniramine

## 2018-08-14 NOTE — Assessment & Plan Note (Signed)
Controlled, no change in medication DASH diet and commitment to daily physical activity for a minimum of 30 minutes discussed and encouraged, as a part of hypertension management. The importance of attaining a healthy weight is also discussed.  BP/Weight 08/13/2018 04/15/2018 04/03/2018 02/20/2018 12/25/2017 12/04/2017 1/76/1607  Systolic BP 371 062 694 854 627 035 009  Diastolic BP 80 70 80 70 66 70 81  Wt. (Lbs) 128 129.8 130 130.25 125.08 128.12 132  BMI 24.19 24.53 24.56 24.61 23.25 23.82 24.54

## 2018-08-14 NOTE — Progress Notes (Signed)
Emily Jennings     MRN: [id number]      DOB: 1934/01/30   HPI Emily Jennings is here for follow up and re-evaluation of chronic medical conditions, medication management and review of any available recent lab and radiology data.  Preventive health is updated, specifically  Cancer screening and Immunization.   Questions or concerns regarding consultations or procedures which the PT has had in the interim are  addressed. The PT denies any adverse reactions to current medications since the last visit.  C/o intermittent cramps posterior left thigh, not disabling Denies polyuria, polydipsia, blurred vision , or hypoglycemic episodes.  There are no specific complaints   ROS Denies recent fever or chills. Denies sinus pressure, nasal congestion, ear pain or sore throat. Denies chest congestion, productive cough or wheezing. Denies chest pains, palpitations and leg swelling Denies abdominal pain, nausea, vomiting,diarrhea or constipation.   Denies dysuria, frequency, hesitancy or incontinence. Denies joint pain, swelling and limitation in mobility. Denies headaches, seizures, numbness, or tingling. Denies depression, anxiety or insomnia. Denies skin break down or rash.   PE  BP 138/80   Pulse 88   Resp 16   Ht 5\' 1"  (1.549 m)   Wt 128 lb (58.1 kg)   SpO2 96%   BMI 24.19 kg/m   Patient alert and oriented and in no cardiopulmonary distress.  HEENT: No facial asymmetry, EOMI,   oropharynx pink and moist.  Neck supple no JVD, no mass.  Chest: Clear to auscultation bilaterally.  CVS: S1, S2 no murmurs, no S3.Regular rate.  ABD: Soft non tender.   Ext: No edema  MS: Adequate ROM spine, shoulders, hips and knees.  Skin: Intact, no ulcerations or rash noted.  Psych: Good eye contact, normal affect. Memory intact not anxious or depressed appearing.  CNS: CN 2-12 intact, power,  normal throughout.no focal deficits noted.   Assessment & Plan  Essential hypertension Controlled, no  change in medication DASH diet and commitment to daily physical activity for a minimum of 30 minutes discussed and encouraged, as a part of hypertension management. The importance of attaining a healthy weight is also discussed.  BP/Weight 08/13/2018 04/15/2018 04/03/2018 02/20/2018 12/25/2017 12/04/2017 0/09/7492  Systolic BP 496 759 163 846 659 935 701  Diastolic BP 80 70 80 70 66 70 81  Wt. (Lbs) 128 129.8 130 130.25 125.08 128.12 132  BMI 24.19 24.53 24.56 24.61 23.25 23.82 24.54       Type 2 diabetes mellitus with stage 3 chronic kidney disease, with long-term current use of insulin (HCC) Controlled, no change in medication Emily Jennings is reminded of the importance of commitment to daily physical activity for 30 minutes or more, as able and the need to limit carbohydrate intake to 30 to 60 grams per meal to help with blood sugar control.   The need to take medication as prescribed, test blood sugar as directed, and to call between visits if there is a concern that blood sugar is uncontrolled is also discussed.   Emily Jennings is reminded of the importance of daily foot exam, annual eye examination, and good blood sugar, blood pressure and cholesterol control.  Diabetic Labs Latest Ref Rng & Units 03/29/2018 12/05/2017 11/27/2017 11/27/2017 11/09/2017  HbA1c - - - CANCELED 7.7(H) -  Microalbumin Not Estab. ug/mL - 1,218.8(H) - - -  Micro/Creat Ratio 0.0 - 30.0 mg/g creat - 783.3(H) - - -  Chol <200 mg/dL 214(H) - - - -  HDL >50 mg/dL 106 - - - -  Calc LDL mg/dL (calc) 98 - - - -  Triglycerides <150 mg/dL 33 - - - -  Creatinine 0.60 - 0.88 mg/dL - - - 1.24(H) 1.10(H)   BP/Weight 08/13/2018 04/15/2018 04/03/2018 02/20/2018 12/25/2017 12/04/2017 04/12/2034  Systolic BP 597 416 384 536 468 032 122  Diastolic BP 80 70 80 70 66 70 81  Wt. (Lbs) 128 129.8 130 130.25 125.08 128.12 132  BMI 24.19 24.53 24.56 24.61 23.25 23.82 24.54   Foot/eye exam completion dates Latest Ref Rng & Units 04/03/2018 03/19/2017  Eye  Exam No Retinopathy - -  Foot Form Completion - Done Done   Start statin therapy     Allergic rhinitis No current flare, best contol noted with as needed chlorpheniramine

## 2018-08-14 NOTE — Assessment & Plan Note (Signed)
Controlled, no change in medication Emily Jennings is reminded of the importance of commitment to daily physical activity for 30 minutes or more, as able and the need to limit carbohydrate intake to 30 to 60 grams per meal to help with blood sugar control.   The need to take medication as prescribed, test blood sugar as directed, and to call between visits if there is a concern that blood sugar is uncontrolled is also discussed.   Emily Jennings is reminded of the importance of daily foot exam, annual eye examination, and good blood sugar, blood pressure and cholesterol control.  Diabetic Labs Latest Ref Rng & Units 03/29/2018 12/05/2017 11/27/2017 11/27/2017 11/09/2017  HbA1c - - - CANCELED 7.7(H) -  Microalbumin Not Estab. ug/mL - 1,218.8(H) - - -  Micro/Creat Ratio 0.0 - 30.0 mg/g creat - 783.3(H) - - -  Chol <200 mg/dL 214(H) - - - -  HDL >50 mg/dL 106 - - - -  Calc LDL mg/dL (calc) 98 - - - -  Triglycerides <150 mg/dL 33 - - - -  Creatinine 0.60 - 0.88 mg/dL - - - 1.24(H) 1.10(H)   BP/Weight 08/13/2018 04/15/2018 04/03/2018 02/20/2018 12/25/2017 12/04/2017 12/01/1759  Systolic BP 607 371 062 694 854 627 035  Diastolic BP 80 70 80 70 66 70 81  Wt. (Lbs) 128 129.8 130 130.25 125.08 128.12 132  BMI 24.19 24.53 24.56 24.61 23.25 23.82 24.54   Foot/eye exam completion dates Latest Ref Rng & Units 04/03/2018 03/19/2017  Eye Exam No Retinopathy - -  Foot Form Completion - Done Done   Start statin therapy

## 2018-08-15 LAB — COMPLETE METABOLIC PANEL WITH GFR
AG Ratio: 1.9 (calc) (ref 1.0–2.5)
ALT: 14 U/L (ref 6–29)
AST: 26 U/L (ref 10–35)
Albumin: 4.2 g/dL (ref 3.6–5.1)
Alkaline phosphatase (APISO): 65 U/L (ref 33–130)
BUN/Creatinine Ratio: 16 (calc) (ref 6–22)
BUN: 19 mg/dL (ref 7–25)
CALCIUM: 9.6 mg/dL (ref 8.6–10.4)
CO2: 29 mmol/L (ref 20–32)
CREATININE: 1.18 mg/dL — AB (ref 0.60–0.88)
Chloride: 102 mmol/L (ref 98–110)
GFR, EST NON AFRICAN AMERICAN: 43 mL/min/{1.73_m2} — AB (ref >=60)
GFR, Est African American: 50 mL/min/{1.73_m2} — ABNORMAL LOW (ref >=60)
GLOBULIN: 2.2 g/dL (ref 1.9–3.7)
Glucose, Bld: 188 mg/dL — ABNORMAL HIGH (ref 65–99)
Potassium: 4.5 mmol/L (ref 3.5–5.3)
Sodium: 141 mmol/L (ref 135–146)
Total Bilirubin: 0.6 mg/dL (ref 0.2–1.2)
Total Protein: 6.4 g/dL (ref 6.1–8.1)

## 2018-08-15 LAB — LIPID PANEL
CHOL/HDL RATIO: 1.9 (calc) (ref <5.0)
CHOLESTEROL: 202 mg/dL — AB (ref <200)
HDL: 106 mg/dL (ref >50)
LDL Cholesterol (Calc): 83 mg/dL (calc)
Non-HDL Cholesterol (Calc): 96 mg/dL (calc) (ref <130)
Triglycerides: 44 mg/dL (ref <150)

## 2018-08-15 LAB — TSH: TSH: 1.14 mIU/L (ref 0.40–4.50)

## 2018-08-15 LAB — HEMOGLOBIN A1C
EAG (MMOL/L): 9.7 (calc)
Hgb A1c MFr Bld: 7.7 % of total Hgb — ABNORMAL HIGH (ref <5.7)
MEAN PLASMA GLUCOSE: 174 (calc)

## 2018-08-15 LAB — VITAMIN D 25 HYDROXY (VIT D DEFICIENCY, FRACTURES): VIT D 25 HYDROXY: 35 ng/mL (ref 30–100)

## 2018-08-19 ENCOUNTER — Encounter: Payer: Self-pay | Admitting: Gastroenterology

## 2018-08-19 ENCOUNTER — Ambulatory Visit (AMBULATORY_SURGERY_CENTER): Payer: Self-pay

## 2018-08-19 VITALS — Ht 60.0 in | Wt 131.8 lb

## 2018-08-19 DIAGNOSIS — Z86010 Personal history of colonic polyps: Secondary | ICD-10-CM

## 2018-08-19 MED ORDER — NA SULFATE-K SULFATE-MG SULF 17.5-3.13-1.6 GM/177ML PO SOLN: 1.0000 | Freq: Once | ORAL | 0 refills | Status: AC

## 2018-08-19 NOTE — Progress Notes (Signed)
Denies allergies to eggs or soy products. Denies complication of anesthesia or sedation. Denies use of weight loss medication. Denies use of O2.   Emmi instructions declined.   A sample of Suprep was given to the patient. Patient verbalizes understanding of diabetic instructions.

## 2018-08-20 ENCOUNTER — Encounter: Payer: Self-pay | Admitting: Family Medicine

## 2018-08-27 ENCOUNTER — Encounter: Payer: Self-pay | Admitting: "Endocrinology

## 2018-08-27 ENCOUNTER — Encounter: Payer: Self-pay | Admitting: Family Medicine

## 2018-08-27 ENCOUNTER — Ambulatory Visit (INDEPENDENT_AMBULATORY_CARE_PROVIDER_SITE_OTHER): Payer: PPO | Admitting: Family Medicine

## 2018-08-27 ENCOUNTER — Ambulatory Visit (INDEPENDENT_AMBULATORY_CARE_PROVIDER_SITE_OTHER): Payer: PPO | Admitting: "Endocrinology

## 2018-08-27 VITALS — BP 140/65 | HR 87 | Ht 61.0 in | Wt 131.0 lb

## 2018-08-27 DIAGNOSIS — M62838 Other muscle spasm: Secondary | ICD-10-CM | POA: Diagnosis not present

## 2018-08-27 DIAGNOSIS — N183 Chronic kidney disease, stage 3 (moderate): Secondary | ICD-10-CM | POA: Diagnosis not present

## 2018-08-27 DIAGNOSIS — M25512 Pain in left shoulder: Secondary | ICD-10-CM

## 2018-08-27 DIAGNOSIS — E782 Mixed hyperlipidemia: Secondary | ICD-10-CM | POA: Diagnosis not present

## 2018-08-27 DIAGNOSIS — J209 Acute bronchitis, unspecified: Secondary | ICD-10-CM | POA: Insufficient documentation

## 2018-08-27 DIAGNOSIS — Z794 Long term (current) use of insulin: Secondary | ICD-10-CM | POA: Diagnosis not present

## 2018-08-27 DIAGNOSIS — J0100 Acute maxillary sinusitis, unspecified: Secondary | ICD-10-CM

## 2018-08-27 DIAGNOSIS — J3089 Other allergic rhinitis: Secondary | ICD-10-CM

## 2018-08-27 DIAGNOSIS — E1122 Type 2 diabetes mellitus with diabetic chronic kidney disease: Secondary | ICD-10-CM | POA: Diagnosis not present

## 2018-08-27 DIAGNOSIS — I10 Essential (primary) hypertension: Secondary | ICD-10-CM

## 2018-08-27 MED ORDER — MONTELUKAST SODIUM 10 MG PO TABS: 10.0000 mg | ORAL_TABLET | Freq: Every day | ORAL | 3 refills | Status: DC

## 2018-08-27 MED ORDER — ACETAMINOPHEN 500 MG PO TABS: ORAL_TABLET | ORAL | 0 refills | Status: DC

## 2018-08-27 MED ORDER — TIZANIDINE HCL 2 MG PO TABS: ORAL_TABLET | ORAL | 0 refills | Status: DC

## 2018-08-27 MED ORDER — KETOROLAC TROMETHAMINE 60 MG/2ML IM SOLN
30.0000 mg | Freq: Once | INTRAMUSCULAR | Status: AC
  Administered 2018-08-27: 30 mg via INTRAMUSCULAR

## 2018-08-27 MED ORDER — AZITHROMYCIN 250 MG PO TABS: ORAL_TABLET | ORAL | 0 refills | Status: DC

## 2018-08-27 MED ORDER — BENZONATATE 100 MG PO CAPS: 100.0000 mg | ORAL_CAPSULE | Freq: Two times a day (BID) | ORAL | 0 refills | Status: DC | PRN

## 2018-08-27 NOTE — Progress Notes (Signed)
Endocrinology follow-up note  Subjective:    Patient ID: Emily Jennings, female    DOB: 29-Mar-1934,    Past Medical History:  Diagnosis Date  . Allergy   . Anxiety   . Arthritis   . Cancer [facility]) 2009   breast, carcinoma in situ left  . Carotid stenosis    11/16/2005  mild plaque formation and stenosis proximal right ECA  . Cataract   . Complication of anesthesia   . Coronary artery disease    cardiac catheterization on 03/20/2006  LAD mid 40% stenosis, left circumflex mild 40% stenosis, RCA mid-vessel 40% to 50% lesion   EF 60%  . Diabetes mellitus   . GERD (gastroesophageal reflux disease)   . Hernia, inguinal    left  . Hyperglycemia   . Hypertension   . Low blood potassium   . Non-insulin dependent type 2 diabetes mellitus ([place])   . Osteoporosis   . Shortness of breath    2D Echocardiogram 01/26/2009   EF of greater than 55%, mild MR, mild TR, normal ventricular function  . Ventricular tachycardia, non-sustained (HCC)    developed during stress test 02/08/2006, spontaneously aborted, mild reversible apical defect   Past Surgical History:  Procedure Laterality Date  . BREAST LUMPECTOMY Left 2009   Left breast 2009  . CATARACT EXTRACTION W/PHACO Left 10/28/2014   Procedure: PHACO EMULSION CATARACT EXTRACTION WITH INTRAOCULAR LENS IMPLANT LEFT EYE (IOC);  Surgeon: Marylynn Pearson, MD;  Location: [facility];  Service: Ophthalmology;  Laterality: Left;  . COLONOSCOPY    . cyst removed from left foot     Social History   Socioeconomic History  . Marital status: Married    Spouse name: Not on file  . Number of children: Not on file  . Years of education: na  . Highest education level: Not on file  Occupational History  . 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 Financial resource strain: Not hard at all  . Food insecurity:    Worry: Never true    Inability: Never true  . Transportation needs:    Medical: No    Non-medical: No  Tobacco Use  . Smoking  status: Never Smoker  . Smokeless tobacco: Never Used  Substance and Sexual Activity  . Alcohol use: No  . Drug use: No  . Sexual activity: Not Currently    Birth control/protection: Post-menopausal  Lifestyle  . Physical activity:    Days per week: 2 days    Minutes per session: 50 min  . Stress: Not at all  Relationships  . Social connections:    Talks on phone: Twice a week    Gets together: Once a week    Attends religious service: More than 4 times per year    Active member of club or organization: No    Attends meetings of clubs or organizations: Never    Relationship status: Married  Other Topics Concern  . Not on file  Social History Narrative  . Not on file   Outpatient Encounter Medications as of 08/27/2018  Medication Sig  . acetaminophen (TYLENOL) 500 MG tablet Take one tablet two times daily for 3 days, then as needed  . Albuterol Sulfate (PROAIR RESPICLICK) 970 (90 Base) MCG/ACT AEPB Inhale 2 puffs into the lungs every 6 (six) hours as needed. (Patient not taking: Reported on 08/27/2018)  . alendronate (FOSAMAX) 70 MG tablet TAKE 1 TABLET BY MOUTH EVERY 7 DAYS. TAKE WITH A FULL GLASS  OF WATER ON AN EMPTY STOMACH  . amLODipine (NORVASC) 10 MG tablet Take 1 tablet (10 mg total) by mouth daily.  Marland Kitchen aspirin EC 81 MG tablet Take 81 mg by mouth daily.  Marland Kitchen azithromycin (ZITHROMAX) 250 MG tablet Two tablets on day one and one tablet once daily for an additional four days  . B-D ULTRAFINE III SHORT PEN 31G X 8 MM MISC USE AS DIRECTED  . BD INSULIN SYRINGE U/F 31G X 5/16" 0.5 ML MISC USE AS DIRECTED TWICE DAILY  . benzonatate (TESSALON) 100 MG capsule Take 1 capsule (100 mg total) by mouth 2 (two) times daily as needed for cough.  . blood glucose meter kit and supplies KIT One touch Ultra. Use up to two times daily as directed. (FOR ICD E11.65)  . calcium-vitamin D (OSCAL WITH D) 500-200 MG-UNIT per tablet Take 1 tablet by mouth daily with breakfast.   . KLOR-CON M20 20 MEQ  tablet TAKE 1 TABLET BY MOUTH TWICE A DAY  . losartan (COZAAR) 50 MG tablet Take 1 tablet (50 mg total) by mouth daily.  . metoprolol tartrate (LOPRESSOR) 50 MG tablet Take 1 tablet (50 mg total) by mouth 2 (two) times daily.  . montelukast (SINGULAIR) 10 MG tablet Take 1 tablet (10 mg total) by mouth at bedtime.  Marland Kitchen NOVOLOG MIX 70/30 (70-30) 100 UNIT/ML injection INJECT 15 UNITS WITH BREAKFAST AND 10 UNITS WITH SUPPER WHEN BLOOD GLUCOSE IS ABOVE 90.  . omega-3 acid ethyl esters (LOVAZA) 1 g capsule Take by mouth 2 (two) times daily.  . ONE TOUCH ULTRA TEST test strip USE TO TEST BLOOD SUGAR 4 TIMES A DAY (E11.65)  . ONETOUCH DELICA LANCETS 16X MISC USE TO OBTAIN A BLOOD SPECIMEN 2 TIMES A DAY  . potassium chloride SA (KLOR-CON M20) 20 MEQ tablet Take 1 tablet (20 mEq total) by mouth 2 (two) times daily.  . rosuvastatin (CRESTOR) 5 MG tablet Take 1 tablet (5 mg total) by mouth at bedtime.  Marland Kitchen spironolactone (ALDACTONE) 25 MG tablet TAKE 1 TABLET BY MOUTH TWICE A DAY  . tiZANidine (ZANAFLEX) 2 MG tablet Take one tablet at bedtime , as needed, for neck spasm   No facility-administered encounter medications on file as of 08/27/2018.    ALLERGIES: Allergies  Allergen Reactions  . Benadryl [Diphenhydramine Hcl] Hypertension  . Citalopram   . Metformin And Related Diarrhea  . Tramadol Other (See Comments)    Felt light headed and dizzy  . Tylenol [Acetaminophen] Other (See Comments)    Patient stated tylenol makes her "feel high"   VACCINATION STATUS: Immunization History  Administered Date(s) Administered  . Influenza Split 07/17/2011  . Influenza Whole 06/18/2007, 07/29/2009, 05/16/2013  . Influenza, High Dose Seasonal PF 08/13/2018  . Influenza,inj,Quad PF,6+ Mos 05/19/2014, 08/22/2016, 05/17/2017  . Pneumococcal Conjugate-13 03/24/2014  . Pneumococcal Polysaccharide-23 05/20/2008  . Td 06/18/2002  . Tdap 07/17/2011    Diabetes  She presents for her follow-up diabetic visit. She  has type 2 diabetes mellitus. Onset time: She was diagnosed at approximate age of 78 years. Her disease course has been stable. There are no hypoglycemic associated symptoms. Pertinent negatives for hypoglycemia include no confusion, headaches, pallor or seizures. There are no diabetic associated symptoms. Pertinent negatives for diabetes include no chest pain, no fatigue, no polydipsia, no polyphagia and no polyuria. There are no hypoglycemic complications. Symptoms are stable. Diabetic complications include nephropathy. Risk factors for coronary artery disease include diabetes mellitus, dyslipidemia, hypertension and sedentary lifestyle. Current diabetic treatment  includes insulin injections. She is compliant with treatment some of the time. Her weight is fluctuating minimally. She is following a generally unhealthy diet. She has had a previous visit with a dietitian. She never participates in exercise. Her home blood glucose trend is fluctuating minimally. Her breakfast blood glucose range is generally 140-180 mg/dl. Her lunch blood glucose range is generally 140-180 mg/dl. Her dinner blood glucose range is generally 140-180 mg/dl. Her overall blood glucose range is 140-180 mg/dl. An ACE inhibitor/angiotensin II receptor blocker is being taken.  Hyperlipidemia  This is a chronic problem. The current episode started more than 1 year ago. Pertinent negatives include no chest pain, myalgias or shortness of breath. Risk factors for coronary artery disease include diabetes mellitus, dyslipidemia and a sedentary lifestyle.  Hypertension  This is a chronic problem. The current episode started more than 1 year ago. Pertinent negatives include no chest pain, headaches, palpitations or shortness of breath. Risk factors for coronary artery disease include diabetes mellitus, dyslipidemia and sedentary lifestyle. Past treatments include angiotensin blockers.      Review of Systems  Constitutional: Negative for  fatigue and unexpected weight change.  HENT: Negative for trouble swallowing and voice change.   Eyes: Negative for visual disturbance.  Respiratory: Negative for cough, shortness of breath and wheezing.   Cardiovascular: Negative for chest pain, palpitations and leg swelling.  Gastrointestinal: Negative for diarrhea, nausea and vomiting.  Endocrine: Negative for cold intolerance, heat intolerance, polydipsia, polyphagia and polyuria.  Musculoskeletal: Negative for arthralgias and myalgias.  Skin: Negative for color change, pallor, rash and wound.  Neurological: Negative for seizures and headaches.  Psychiatric/Behavioral: Negative for confusion and suicidal ideas.    Objective:    BP 140/65   Pulse 87   Ht 5' 1" (1.549 m)   Wt 131 lb (59.4 kg)   BMI 24.75 kg/m   Wt Readings from Last 3 Encounters:  08/27/18 131 lb (59.4 kg)  08/27/18 127 lb 0.6 oz (57.6 kg)  08/19/18 131 lb 12.8 oz (59.8 kg)    Physical Exam  Constitutional: She is oriented to person, place, and time. She appears well-developed.  HENT:  Head: Normocephalic and atraumatic.  Eyes: EOM are normal.  Neck: Normal range of motion. Neck supple. No tracheal deviation present. No thyromegaly present.  Cardiovascular: Normal rate and regular rhythm.  Pulmonary/Chest: Effort normal.  Abdominal: There is no tenderness. There is no guarding.  Musculoskeletal: Normal range of motion. She exhibits no edema.  Neurological: She is alert and oriented to person, place, and time. No cranial nerve deficit. Coordination normal.  Skin: No rash noted. No erythema. No pallor.  Psychiatric: She has a normal mood and affect. Judgment normal.    Results for orders placed or performed in visit on 08/13/18  Lipid panel  Result Value Ref Range   Cholesterol 202 (H) <200 mg/dL   HDL 106 >50 mg/dL   Triglycerides 44 <150 mg/dL   LDL Cholesterol (Calc) 83 mg/dL (calc)   Total CHOL/HDL Ratio 1.9 <5.0 (calc)   Non-HDL Cholesterol  (Calc) 96 <130 mg/dL (calc)  TSH  Result Value Ref Range   TSH 1.14 0.40 - 4.50 mIU/L  VITAMIN D 25 Hydroxy (Vit-D Deficiency, Fractures)  Result Value Ref Range   Vit D, 25-Hydroxy 35 30 - 100 ng/mL   Complete Blood Count (Most recent): Lab Results  Component Value Date   WBC 5.9 05/16/2017   HGB 12.9 05/16/2017   HCT 40.1 05/16/2017   MCV 93.9 05/16/2017  PLT 331 05/16/2017   Chemistry (most recent): Lab Results  Component Value Date   NA 141 08/14/2018   K 4.5 08/14/2018   CL 102 08/14/2018   CO2 29 08/14/2018   BUN 19 08/14/2018   CREATININE 1.18 (H) 08/14/2018   Diabetic Labs (most recent): Lab Results  Component Value Date   HGBA1C 7.7 (H) 08/14/2018   HGBA1C 7.7 (H) 11/27/2017   HGBA1C CANCELED 11/27/2017   Lipid Panel     Component Value Date/Time   CHOL 202 (H) 08/14/2018 0948   TRIG 44 08/14/2018 0948   HDL 106 08/14/2018 0948   CHOLHDL 1.9 08/14/2018 0948   VLDL 8 10/31/2016 0845   LDLCALC 83 08/14/2018 0948     Assessment & Plan:   1. Type 2 diabetes mellitus with stage 3 chronic kidney disease, with long-term current use of insulin   Patient came with near target glycemic profile.  No major hypoglycemia reported nor documented.   -Her previsit labs showed her recent A1c is acceptable at  7.7 %, generally improved from  9.2 %. Recent labs reviewed.  Patient remains at a high risk for more acute and chronic complications of diabetes which include CAD, CVA, CKD, retinopathy, and neuropathy. These are all discussed in detail with the patient. I have re-counseled the patient on diet management and weight loss, by adopting a carbohydrate restricted / protein rich  Diet.  -  Suggestion is made for her to avoid simple carbohydrates  from her diet including Cakes, Sweet Desserts / Pastries, Ice Cream, Soda (diet and regular), Sweet Tea, Candies, Chips, Cookies, Store Bought Juices, Alcohol in Excess of  1-2 drinks a day, Artificial Sweeteners, and  "Sugar-free" Products. This will help patient to have stable blood glucose profile and potentially avoid unintended weight gain.  I have approached patient to continue on  intensive monitoring of blood glucose and insulin therapy, and patient agrees.   - She continued to do fairly well on premixed insulin.  -I had a long discussion with her specifically to avoid injecting insulin without monitoring blood glucose.  -She is advised to continue NovoLog 70/30  15 units  with breakfast  and 10 units with supper when pre-meal glucose is above 90 mg/dL . - She is warned not to inject insulin without proper monitoring of blood glucose. She is advised to continue monitoring her blood glucose 4 times a day-before meals and at bedtime.    -Patient is encouraged to call clinic for blood glucose levels less than 70 or above 300 mg /dl.  -Target numbers for A1c, LDL, HDL, Triglycerides, Waist Circumference were discussed in detail.  2) HTN: Her blood pressure is controlled to target.  She is advised to continue her current blood pressure medications including spironolactone 25 mg daily, losartan 50 mg p.o. daily.    3. Lipids: Her most recent lipid panel showed LDL at 98.  She has not tolerated statins.  She is advised to stay on omega-3 fatty acids.  4)  Weight/Diet: CDE consult in progress, exercise, and carbs information provided.   4) Chronic Care:  -Patient is on ARB  medications and encouraged to continue to follow up with Ophthalmology, Podiatrist at least yearly or according to recommendations, and advised to stay away from smoking. I have recommended yearly flu vaccine and pneumonia vaccination at least every 5 years; and  sleep for at least 7 hours a day.  - Time spent with the patient: 25 min, of which >50% was spent in  reviewing her blood glucose logs , discussing her hypo- and hyper-glycemic episodes, reviewing her current and  previous labs and insulin doses and developing a plan to avoid  hypo- and hyper-glycemia. Please refer to Patient Instructions for Blood Glucose Monitoring and Insulin/Medications Dosing Guide"  in media tab for additional information. Anetra Hewitt Shorts participated in the discussions, expressed understanding, and voiced agreement with the above plans.  All questions were answered to her satisfaction. she is encouraged to contact clinic should she have any questions or concerns prior to her return visit.   Follow up plan: Return in about 3 months (around 11/26/2018) for Meter, and Logs.  Glade Lloyd, MD Phone: [phone]  Fax: [phone]  This note was partially dictated with voice recognition software. Similar sounding words can be transcribed inadequately or may not  be corrected upon review.  08/27/2018, 3:13 PM

## 2018-08-27 NOTE — Patient Instructions (Addendum)
F/U as before, call if you need me sooner  Toradol 30 mg IM in the office today for neck pain  Tylenol ES and a muscle relaxant are prescribed for neck pain and spasm  You are treated for sinus congestion and bronchitis, decongestant and antibiotic and allergy tablet  are prescribed

## 2018-08-27 NOTE — Patient Instructions (Signed)

## 2018-08-27 NOTE — Progress Notes (Signed)
   TANIQUE MATNEY     MRN: [id number]      DOB: [birth year]   HPI Ms. Harroun is here stating that after being in a building this past Saturday, she started having woke with pain in right ear , increased congestion of head and chest , cough, not much sputum, and nasal drainage sometimes yellow, intermittent chills  Neck pain and left ear sided, also c/o left shoulder pain and neck stiffness  ROS  Denies chest pains, palpitations and leg swelling Denies abdominal pain, nausea, vomiting,diarrhea or constipation.   Denies dysuria, frequency, hesitancy or incontinence.  Denies headaches, seizures, numbness, or tingling. Denies depression, anxiety or insomnia. Denies skin break down or rash.   PE  BP (!) 168/68 (BP Location: Left Arm, Patient Position: Sitting, Cuff Size: Normal)   Pulse 100   Temp 98.9 F (37.2 C) (Oral)   Resp 12   Ht 5\' 1"  (1.549 m)   Wt 127 lb 0.6 oz (57.6 kg)   SpO2 95% Comment: room air  BMI 24.00 kg/m   Patient alert and oriented and in no cardiopulmonary distress.Ill appearing  HEENT: No facial asymmetry, EOMI,   oropharynx pink and moist.  Neck decreased ROM with spasm,  no JVD, no mass.Maxillarysinus tenderness  Chest: decreased air entry though adequate, bilateral basilar crackles, no wheezes  CVS: S1, S2 no murmurs, no S3.Regular rate.  ABD: Soft non tender.   Ext: No edema  MS: Adequate ROM spine,  hips and knees.Decreased in left shoulder  Skin: Intact, no ulcerations or rash noted.  Psych: Good eye contact, normal affect. Memory intact not anxious or depressed appearing.  CNS: CN 2-12 intact, power,  normal throughout.no focal deficits noted.   Assessment & Plan  Acute bronchitis Decongestant and antibiotic prescribed  Neck muscle spasm Toradol ad ministered and muscle relaxant prescribed  Maxillary sinusitis, acute Antibiotic prescribed and saline nasal flushes recommended  Acute pain of left shoulder Toradol admistered IM in office  and tylenol recommmended  Essential hypertension Elevated at visit, not normally the case, delay in taking medication and use of excess oTC decongestant  Allergic rhinitis Uncontrolled , additional medication started, singulair

## 2018-09-02 ENCOUNTER — Encounter: Payer: Self-pay | Admitting: Family Medicine

## 2018-09-02 DIAGNOSIS — M25512 Pain in left shoulder: Secondary | ICD-10-CM | POA: Insufficient documentation

## 2018-09-02 NOTE — Assessment & Plan Note (Signed)
Toradol admistered IM in office and tylenol recommmended

## 2018-09-02 NOTE — Assessment & Plan Note (Signed)
Elevated at visit, not normally the case, delay in taking medication and use of excess oTC decongestant

## 2018-09-02 NOTE — Assessment & Plan Note (Signed)
Decongestant and antibiotic prescribed 

## 2018-09-02 NOTE — Assessment & Plan Note (Signed)
Toradol ad ministered and muscle relaxant prescribed

## 2018-09-02 NOTE — Assessment & Plan Note (Addendum)
Uncontrolled , additional medication started, singulair

## 2018-09-02 NOTE — Assessment & Plan Note (Signed)
Antibiotic prescribed and saline nasal flushes recommended

## 2018-09-03 ENCOUNTER — Encounter: Payer: Self-pay | Admitting: Gastroenterology

## 2018-09-03 ENCOUNTER — Ambulatory Visit (AMBULATORY_SURGERY_CENTER): Payer: PPO | Admitting: Gastroenterology

## 2018-09-03 VITALS — BP 146/73 | HR 77 | Temp 98.2°F | Resp 10 | Ht 60.0 in | Wt 131.0 lb

## 2018-09-03 DIAGNOSIS — Z86010 Personal history of colonic polyps: Secondary | ICD-10-CM | POA: Diagnosis not present

## 2018-09-03 DIAGNOSIS — D122 Benign neoplasm of ascending colon: Secondary | ICD-10-CM

## 2018-09-03 DIAGNOSIS — D123 Benign neoplasm of transverse colon: Secondary | ICD-10-CM | POA: Diagnosis not present

## 2018-09-03 DIAGNOSIS — D124 Benign neoplasm of descending colon: Secondary | ICD-10-CM | POA: Diagnosis not present

## 2018-09-03 MED ORDER — SODIUM CHLORIDE 0.9 % IV SOLN: 500.0000 mL | Freq: Once | INTRAVENOUS | Status: DC

## 2018-09-03 NOTE — Patient Instructions (Signed)
Handouts given on diverticulosis, polyps and high fiber diet.   YOU HAD AN ENDOSCOPIC PROCEDURE TODAY AT [facility]:   Refer to the procedure report that was given to you for any specific questions about what was found during the examination.  If the procedure report does not answer your questions, please call your gastroenterologist to clarify.  If you requested that your care partner not be given the details of your procedure findings, then the procedure report has been included in a sealed envelope for you to review at your convenience later.  YOU SHOULD EXPECT: Some feelings of bloating in the abdomen. Passage of more gas than usual.  Walking can help get rid of the air that was put into your GI tract during the procedure and reduce the bloating. If you had a lower endoscopy (such as a colonoscopy or flexible sigmoidoscopy) you may notice spotting of blood in your stool or on the toilet paper. If you underwent a bowel prep for your procedure, you may not have a normal bowel movement for a few days.  Please Note:  You might notice some irritation and congestion in your nose or some drainage.  This is from the oxygen used during your procedure.  There is no need for concern and it should clear up in a day or so.  SYMPTOMS TO REPORT IMMEDIATELY:   Following lower endoscopy (colonoscopy or flexible sigmoidoscopy):  Excessive amounts of blood in the stool  Significant tenderness or worsening of abdominal pains  Swelling of the abdomen that is new, acute  Fever of 100F or higher   For urgent or emergent issues, a gastroenterologist can be reached at any hour by calling [phone].   DIET:  We do recommend a small meal at first, but then you may proceed to your regular diet.  Drink plenty of fluids but you should avoid alcoholic beverages for 24 hours.  ACTIVITY:  You should plan to take it easy for the rest of today and you should NOT DRIVE or use heavy machinery until  tomorrow (because of the sedation medicines used during the test).    FOLLOW UP: Our staff will call the number listed on your records the next business day following your procedure to check on you and address any questions or concerns that you may have regarding the information given to you following your procedure. If we do not reach you, we will leave a message.  However, if you are feeling well and you are not experiencing any problems, there is no need to return our call.  We will assume that you have returned to your regular daily activities without incident.  If any biopsies were taken you will be contacted by phone or by letter within the next 1-3 weeks.  Please call us at [phone] if you have not heard about the biopsies in 3 weeks.    SIGNATURES/CONFIDENTIALITY: You and/or your care partner have signed paperwork which will be entered into your electronic medical record.  These signatures attest to the fact that that the information above on your After Visit Summary has been reviewed and is understood.  Full responsibility of the confidentiality of this discharge information lies with you and/or your care-partner.

## 2018-09-03 NOTE — Progress Notes (Signed)
PT taken to PACU. Monitors in place. VSS. Report given to RN. 

## 2018-09-03 NOTE — Progress Notes (Signed)
Called to room to assist during endoscopic procedure.  Patient ID and intended procedure confirmed with present staff. Received instructions for my participation in the procedure from the performing physician.  

## 2018-09-03 NOTE — Progress Notes (Signed)
Pt's states no medical or surgical changes since previsit or office visit. 

## 2018-09-03 NOTE — Op Note (Signed)
[place] Patient Name: Emily Jennings Procedure Date: 09/03/2018 9:19 AM MRN: [id number] Endoscopist: Ladene Artist , MD Age: 84 Referring MD:  Date of Birth: 1933/10/17 Gender: Female Account #: [id number] Procedure:                Colonoscopy Indications:              Surveillance: Personal history of adenomatous                            polyps on last colonoscopy 5 years ago Medicines:                Monitored Anesthesia Care Procedure:                Pre-Anesthesia Assessment:                           - Prior to the procedure, a History and Physical                            was performed, and patient medications and                            allergies were reviewed. The patient's tolerance of                            previous anesthesia was also reviewed. The risks                            and benefits of the procedure and the sedation                            options and risks were discussed with the patient.                            All questions were answered, and informed consent                            was obtained. Prior Anticoagulants: The patient has                            taken no previous anticoagulant or antiplatelet                            agents. ASA Grade Assessment: II - A patient with                            mild systemic disease. After reviewing the risks                            and benefits, the patient was deemed in                            satisfactory condition to undergo the procedure.  After obtaining informed consent, the colonoscope                            was passed under direct vision. Throughout the                            procedure, the patient's blood pressure, pulse, and                            oxygen saturations were monitored continuously. The                            Model PCF-[id number]) scope was introduced                            through the anus and  advanced to the the cecum,                            identified by appendiceal orifice and ileocecal                            valve. The ileocecal valve, appendiceal orifice,                            and rectum were photographed. The quality of the                            bowel preparation was good. The colonoscopy was                            performed without difficulty. The patient tolerated                            the procedure well. Scope In: 9:33:00 AM Scope Out: 9:50:53 AM Scope Withdrawal Time: 0 hours 14 minutes 16 seconds  Total Procedure Duration: 0 hours 17 minutes 53 seconds  Findings:                 The perianal and digital rectal examinations were                            normal.                           Six sessile polyps were found in the descending                            colon (1), transverse colon (2) and ascending colon                            (3). The polyps were 6 to 8 mm in size. These                            polyps were removed with a cold snare. Resection  and retrieval were complete.                           Multiple medium-mouthed diverticula were found in                            the left colon.                           The exam was otherwise without abnormality on                            direct views. Unable to retroflex due a narrow                            rectal. Complications:            No immediate complications. Estimated blood loss:                            None. Estimated Blood Loss:     Estimated blood loss: none. Impression:               - Six 6 to 8 mm polyps in the descending colon, in                            the transverse colon and in the ascending colon,                            removed with a cold snare. Resected and retrieved.                           - Diverticulosis in the left colon.                           - The examination was otherwise normal on direct                             and retroflexion views. Recommendation:           - Patient has a contact number available for                            emergencies. The signs and symptoms of potential                            delayed complications were discussed with the                            patient. Return to normal activities tomorrow.                            Written discharge instructions were provided to the                            patient.                           -  Resume previous diet.                           - Continue present medications.                           - Await pathology results.                           - No repeat colonoscopy due to age. Ladene Artist, MD 09/03/2018 9:56:56 AM This report has been signed electronically.

## 2018-09-04 ENCOUNTER — Telehealth: Payer: Self-pay

## 2018-09-04 ENCOUNTER — Telehealth: Payer: Self-pay | Admitting: *Deleted

## 2018-09-04 NOTE — Telephone Encounter (Signed)
Called [phone] and left a messaged we tried to reach pt for a follow up call. maw

## 2018-09-04 NOTE — Telephone Encounter (Signed)
  Follow up Call-  Call back number 09/03/2018  Post procedure Call Back phone  # [phone]  Permission to leave phone message Yes  Some recent data might be hidden     Patient questions:  Left message to call us if necessary.

## 2018-09-09 ENCOUNTER — Other Ambulatory Visit: Payer: Self-pay | Admitting: "Endocrinology

## 2018-09-09 ENCOUNTER — Encounter: Payer: Self-pay | Admitting: Gastroenterology

## 2018-09-25 DIAGNOSIS — H40023 Open angle with borderline findings, high risk, bilateral: Secondary | ICD-10-CM | POA: Diagnosis not present

## 2018-09-25 DIAGNOSIS — E119 Type 2 diabetes mellitus without complications: Secondary | ICD-10-CM | POA: Diagnosis not present

## 2018-09-25 DIAGNOSIS — H2511 Age-related nuclear cataract, right eye: Secondary | ICD-10-CM | POA: Diagnosis not present

## 2018-09-26 ENCOUNTER — Encounter: Payer: Self-pay | Admitting: *Deleted

## 2018-10-09 ENCOUNTER — Ambulatory Visit (INDEPENDENT_AMBULATORY_CARE_PROVIDER_SITE_OTHER): Payer: PPO

## 2018-10-09 ENCOUNTER — Ambulatory Visit: Payer: PPO

## 2018-10-09 VITALS — BP 160/72 | HR 81 | Resp 15 | Ht 60.0 in | Wt 127.0 lb

## 2018-10-09 DIAGNOSIS — Z0000 Encounter for general adult medical examination without abnormal findings: Secondary | ICD-10-CM | POA: Diagnosis not present

## 2018-10-09 NOTE — Patient Instructions (Signed)
Emily Jennings , Thank you for taking time to come for your Medicare Wellness Visit. I appreciate your ongoing commitment to your health goals. Please review the following plan we discussed and let me know if I can assist you in the future.   Screening recommendations/referrals: Colonoscopy: up to date Mammogram: up to date  Bone Density: scheduled Recommended yearly ophthalmology/optometry visit for glaucoma screening and checkup Recommended yearly dental visit for hygiene and checkup  Vaccinations: Influenza vaccine: complete Pneumococcal vaccine: complete Tdap vaccine: up to date Shingles vaccine: ask insurance if covered    Advanced directives: form given   Conditions/risks identified: diabetes, fall risk   Next appointment: wellness in 1 year   Preventive Care 63 Years and Older, Female Preventive care refers to lifestyle choices and visits with your health care provider that can promote health and wellness. What does preventive care include?  A yearly physical exam. This is also called an annual well check.  Dental exams once or twice a year.  Routine eye exams. Ask your health care provider how often you should have your eyes checked.  Personal lifestyle choices, including:  Daily care of your teeth and gums.  Regular physical activity.  Eating a healthy diet.  Avoiding tobacco and drug use.  Limiting alcohol use.  Practicing safe sex.  Taking low-dose aspirin every day.  Taking vitamin and mineral supplements as recommended by your health care provider. What happens during an annual well check? The services and screenings done by your health care provider during your annual well check will depend on your age, overall health, lifestyle risk factors, and family history of disease. Counseling  Your health care provider may ask you questions about your:  Alcohol use.  Tobacco use.  Drug use.  Emotional well-being.  Home and relationship  well-being.  Sexual activity.  Eating habits.  History of falls.  Memory and ability to understand (cognition).  Work and work Statistician.  Reproductive health. Screening  You may have the following tests or measurements:  Height, weight, and BMI.  Blood pressure.  Lipid and cholesterol levels. These may be checked every 5 years, or more frequently if you are over 57 years old.  Skin check.  Lung cancer screening. You may have this screening every year starting at age 56 if you have a 30-pack-year history of smoking and currently smoke or have quit within the past 15 years.  Fecal occult blood test (FOBT) of the stool. You may have this test every year starting at age 29.  Flexible sigmoidoscopy or colonoscopy. You may have a sigmoidoscopy every 5 years or a colonoscopy every 10 years starting at age 57.  Hepatitis C blood test.  Hepatitis B blood test.  Sexually transmitted disease (STD) testing.  Diabetes screening. This is done by checking your blood sugar (glucose) after you have not eaten for a while (fasting). You may have this done every 1-3 years.  Bone density scan. This is done to screen for osteoporosis. You may have this done starting at age 37.  Mammogram. This may be done every 1-2 years. Talk to your health care provider about how often you should have regular mammograms. Talk with your health care provider about your test results, treatment options, and if necessary, the need for more tests. Vaccines  Your health care provider may recommend certain vaccines, such as:  Influenza vaccine. This is recommended every year.  Tetanus, diphtheria, and acellular pertussis (Tdap, Td) vaccine. You may need a Td booster every 10  years.  Zoster vaccine. You may need this after age 92.  Pneumococcal 13-valent conjugate (PCV13) vaccine. One dose is recommended after age 61.  Pneumococcal polysaccharide (PPSV23) vaccine. One dose is recommended after age  72. Talk to your health care provider about which screenings and vaccines you need and how often you need them. This information is not intended to replace advice given to you by your health care provider. Make sure you discuss any questions you have with your health care provider. Document Released: 10/01/2015 Document Revised: 05/24/2016 Document Reviewed: 07/06/2015 Elsevier Interactive Patient Education  2017 [place] Prevention in the Home Falls can cause injuries. They can happen to people of all ages. There are many things you can do to make your home safe and to help prevent falls. What can I do on the outside of my home?  Regularly fix the edges of walkways and driveways and fix any cracks.  Remove anything that might make you trip as you walk through a door, such as a raised step or threshold.  Trim any bushes or trees on the path to your home.  Use bright outdoor lighting.  Clear any walking paths of anything that might make someone trip, such as rocks or tools.  Regularly check to see if handrails are loose or broken. Make sure that both sides of any steps have handrails.  Any raised decks and porches should have guardrails on the edges.  Have any leaves, snow, or ice cleared regularly.  Use sand or salt on walking paths during winter.  Clean up any spills in your garage right away. This includes oil or grease spills. What can I do in the bathroom?  Use night lights.  Install grab bars by the toilet and in the tub and shower. Do not use towel bars as grab bars.  Use non-skid mats or decals in the tub or shower.  If you need to sit down in the shower, use a plastic, non-slip stool.  Keep the floor dry. Clean up any water that spills on the floor as soon as it happens.  Remove soap buildup in the tub or shower regularly.  Attach bath mats securely with double-sided non-slip rug tape.  Do not have throw rugs and other things on the floor that can make  you trip. What can I do in the bedroom?  Use night lights.  Make sure that you have a light by your bed that is easy to reach.  Do not use any sheets or blankets that are too big for your bed. They should not hang down onto the floor.  Have a firm chair that has side arms. You can use this for support while you get dressed.  Do not have throw rugs and other things on the floor that can make you trip. What can I do in the kitchen?  Clean up any spills right away.  Avoid walking on wet floors.  Keep items that you use a lot in easy-to-reach places.  If you need to reach something above you, use a strong step stool that has a grab bar.  Keep electrical cords out of the way.  Do not use floor polish or wax that makes floors slippery. If you must use wax, use non-skid floor wax.  Do not have throw rugs and other things on the floor that can make you trip. What can I do with my stairs?  Do not leave any items on the stairs.  Make sure that  there are handrails on both sides of the stairs and use them. Fix handrails that are broken or loose. Make sure that handrails are as long as the stairways.  Check any carpeting to make sure that it is firmly attached to the stairs. Fix any carpet that is loose or worn.  Avoid having throw rugs at the top or bottom of the stairs. If you do have throw rugs, attach them to the floor with carpet tape.  Make sure that you have a light switch at the top of the stairs and the bottom of the stairs. If you do not have them, ask someone to add them for you. What else can I do to help prevent falls?  Wear shoes that:  Do not have high heels.  Have rubber bottoms.  Are comfortable and fit you well.  Are closed at the toe. Do not wear sandals.  If you use a stepladder:  Make sure that it is fully opened. Do not climb a closed stepladder.  Make sure that both sides of the stepladder are locked into place.  Ask someone to hold it for you, if  possible.  Clearly mark and make sure that you can see:  Any grab bars or handrails.  First and last steps.  Where the edge of each step is.  Use tools that help you move around (mobility aids) if they are needed. These include:  Canes.  Walkers.  Scooters.  Crutches.  Turn on the lights when you go into a dark area. Replace any light bulbs as soon as they burn out.  Set up your furniture so you have a clear path. Avoid moving your furniture around.  If any of your floors are uneven, fix them.  If there are any pets around you, be aware of where they are.  Review your medicines with your doctor. Some medicines can make you feel dizzy. This can increase your chance of falling. Ask your doctor what other things that you can do to help prevent falls. This information is not intended to replace advice given to you by your health care provider. Make sure you discuss any questions you have with your health care provider. Document Released: 07/01/2009 Document Revised: 02/10/2016 Document Reviewed: 10/09/2014 Elsevier Interactive Patient Education  2017 [employer].

## 2018-10-09 NOTE — Progress Notes (Signed)
Subjective:   Emily Jennings is a 85 y.o. female who presents for Medicare Annual (Subsequent) preventive examination.  Review of Systems:   Cardiac Risk Factors include: advanced age (>64mn, >>54women);diabetes mellitus;dyslipidemia     Objective:     Vitals: BP (!) 160/72   Pulse 81   Resp 15   Ht 5' (1.524 m)   Wt 127 lb (57.6 kg)   SpO2 95%   BMI 24.80 kg/m   Body mass index is 24.8 kg/m.  Advanced Directives 10/09/2018 09/03/2018 10/01/2017 09/28/2016 03/29/2015 01/06/2015  Does Patient Have a Medical Advance Directive? No Yes Yes No No No  Would patient like information on creating a medical advance directive? Yes (ED - Information included in AVS) - - Yes (MAU/Ambulatory/Procedural Areas - Information given) - No - patient declined information    Tobacco Social History   Tobacco Use  Smoking Status Never Smoker  Smokeless Tobacco Never Used     Counseling given: Not Answered   Clinical Intake:     Pain : No/denies pain Pain Score: 0-No pain     Nutritional Status: BMI of 19-24  Normal Diabetes: Yes CBG done?: No Did pt. bring in CBG monitor from home?: No  How often do you need to have someone help you when you read instructions, pamphlets, or other written materials from your doctor or pharmacy?: 1 - Never What is the last grade level you completed in school?: 12 grade- some college   Interpreter Needed?: No  Information entered by :: Amylia Collazos lpn   Past Medical History:  Diagnosis Date  . Allergy   . Anxiety   . Arthritis   . Cancer ([facility] 2009   breast, carcinoma in situ left  . Carotid stenosis    11/16/2005  mild plaque formation and stenosis proximal right ECA  . Cataract   . Complication of anesthesia   . Coronary artery disease    cardiac catheterization on 03/20/2006  LAD mid 40% stenosis, left circumflex mild 40% stenosis, RCA mid-vessel 40% to 50% lesion   EF 60%  . Diabetes mellitus   . GERD (gastroesophageal reflux disease)     . Hernia, inguinal    left  . Hyperglycemia   . Hypertension   . Low blood potassium   . [place]   . Osteoporosis   . Shortness of breath    2D Echocardiogram 01/26/2009   EF of greater than 55%, mild MR, mild TR, normal ventricular function  . Ventricular tachycardia, non-sustained (HCC)    developed during stress test 02/08/2006, spontaneously aborted, mild reversible apical defect   Past Surgical History:  Procedure Laterality Date  . BREAST LUMPECTOMY Left 2009   Left breast 2009  . CATARACT EXTRACTION W/PHACO Left 10/28/2014   Procedure: PHACO EMULSION CATARACT EXTRACTION WITH INTRAOCULAR LENS IMPLANT LEFT EYE (IOC);  Surgeon: RMarylynn Pearson MD;  [facility]  Service: Ophthalmology;  Laterality: Left;  . COLONOSCOPY    . cyst removed from left foot     Family History  Problem Relation Age of Onset  . Hypertension Mother   . Hyperlipidemia Mother   . Stroke Mother   . Cancer Father        pancreatic  . Colon cancer Father   . Heart disease Brother 40       bypass  . Heart disease Brother 622      bypass  . Arthritis Other   . Asthma Other   .  Diabetes Other   . Colon cancer Paternal Aunt   . Esophageal cancer Neg Hx   . Stomach cancer Neg Hx   . Rectal cancer Neg Hx    Social History   Socioeconomic History  . Marital status: Married    Spouse name: Not on file  . Number of children: 2  . Years of education: na  . Highest education level: Some college, no degree  Occupational History  . 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 Financial resource strain: Not hard at all  . Food insecurity:    Worry: Never true    Inability: Never true  . Transportation needs:    Medical: No    Non-medical: No  Tobacco Use  . Smoking status: Never Smoker  . Smokeless tobacco: Never Used  Substance and Sexual Activity  . Alcohol use: No  . Drug use: No  . Sexual activity: Not Currently    Birth  control/protection: Post-menopausal  Lifestyle  . Physical activity:    Days per week: 2 days    Minutes per session: 50 min  . Stress: Not at all  Relationships  . Social connections:    Talks on phone: More than three times a week    Gets together: Three times a week    Attends religious service: More than 4 times per year    Active member of club or organization: Yes    Attends meetings of clubs or organizations: More than 4 times per year    Relationship status: Married  Other Topics Concern  . Not on file  Social History Narrative  . Not on file    Outpatient Encounter Medications as of 10/09/2018  Medication Sig  . acetaminophen (TYLENOL) 500 MG tablet Take one tablet two times daily for 3 days, then as needed  . Albuterol Sulfate (PROAIR RESPICLICK) 956 (90 Base) MCG/ACT AEPB Inhale 2 puffs into the lungs every 6 (six) hours as needed.  Marland Kitchen alendronate (FOSAMAX) 70 MG tablet TAKE 1 TABLET BY MOUTH EVERY 7 DAYS. TAKE WITH A FULL GLASS OF WATER ON AN EMPTY STOMACH  . amLODipine (NORVASC) 10 MG tablet Take 1 tablet (10 mg total) by mouth daily.  Marland Kitchen aspirin EC 81 MG tablet Take 81 mg by mouth daily.  . B-D ULTRAFINE III SHORT PEN 31G X 8 MM MISC USE AS DIRECTED  . BD INSULIN SYRINGE U/F 31G X 5/16" 0.5 ML MISC USE AS DIRECTED TWICE DAILY  . benzonatate (TESSALON) 100 MG capsule Take 1 capsule (100 mg total) by mouth 2 (two) times daily as needed for cough.  . blood glucose meter kit and supplies KIT One touch Ultra. Use up to two times daily as directed. (FOR ICD E11.65)  . calcium-vitamin D (OSCAL WITH D) 500-200 MG-UNIT per tablet Take 1 tablet by mouth daily with breakfast.   . KLOR-CON M20 20 MEQ tablet TAKE 1 TABLET BY MOUTH TWICE A DAY  . losartan (COZAAR) 50 MG tablet Take 1 tablet (50 mg total) by mouth daily.  . metoprolol tartrate (LOPRESSOR) 50 MG tablet Take 1 tablet (50 mg total) by mouth 2 (two) times daily.  . montelukast (SINGULAIR) 10 MG tablet Take 1 tablet (10 mg  total) by mouth at bedtime.  Marland Kitchen NOVOLOG MIX 70/30 (70-30) 100 UNIT/ML injection INJECT 15 UNITS WITH BREAKFAST AND 10 UNITS WITH SUPPER WHEN BLOOD GLUCOSE IS ABOVE 90.  . omega-3 acid ethyl esters (LOVAZA) 1 g capsule Take by mouth  2 (two) times daily.  . ONE TOUCH ULTRA TEST test strip USE TO TEST BLOOD SUGAR 4 TIMES A DAY (E11.65)  . ONETOUCH DELICA LANCETS 50I MISC USE TO OBTAIN A BLOOD SPECIMEN 2 TIMES A DAY  . potassium chloride SA (KLOR-CON M20) 20 MEQ tablet Take 1 tablet (20 mEq total) by mouth 2 (two) times daily.  . rosuvastatin (CRESTOR) 5 MG tablet Take 1 tablet (5 mg total) by mouth at bedtime.  Marland Kitchen spironolactone (ALDACTONE) 25 MG tablet TAKE 1 TABLET BY MOUTH TWICE A DAY  . tiZANidine (ZANAFLEX) 2 MG tablet Take one tablet at bedtime , as needed, for neck spasm   No facility-administered encounter medications on file as of 10/09/2018.     Activities of Daily Living In your present state of health, do you have any difficulty performing the following activities: 10/09/2018  Hearing? N  Vision? N  Difficulty concentrating or making decisions? N  Walking or climbing stairs? N  Dressing or bathing? N  Doing errands, shopping? N  Preparing Food and eating ? N  Using the Toilet? N  In the past six months, have you accidently leaked urine? Y  Do you have problems with loss of bowel control? N  Managing your Medications? N  Managing your Finances? N  Housekeeping or managing your Housekeeping? N  Some recent data might be hidden    Patient Care Team: [name], MD as PCP - General [name], [name], MD as Consulting Physician (Endocrinology) Marylynn Pearson, MD as Consulting Physician (Ophthalmology)    Assessment:   This is a routine wellness examination for Monasia.  Exercise Activities and Dietary recommendations Current Exercise Habits: Structured exercise class, Type of exercise: yoga, Time (Minutes): 55, Frequency (Times/Week): 2, Weekly Exercise  (Minutes/Week): 110, Intensity: Moderate  Goals    . Exercise 3x per week (30 min per time) (pt-stated)     Starting 09/29/2016 I would like to start exercising 20 minutes a day every day of the week.    Marland Kitchen HEMOGLOBIN A1C < 7.0     Cut carbs and portion sizes     . Prevent falls     Slow down and take her time       Fall Risk Fall Risk  10/09/2018 08/27/2018 08/27/2018 08/13/2018 04/03/2018  Falls in the past year? 1 0 0 0 No  Number falls in past yr: 0 - 0 - -  Injury with Fall? 0 - 0 - -  Risk for fall due to : - - - - -  Risk for fall due to: Comment - - - - -   Is the patient's home free of loose throw rugs in walkways, pet beds, electrical cords, etc?   yes      Grab bars in the bathroom? yes      Handrails on the stairs?   yes      Adequate lighting?   yes  Timed Get Up and Go performed: performed in 6 seconds without difficulty  Depression Screen PHQ 2/9 Scores 10/09/2018 08/27/2018 08/27/2018 08/13/2018  PHQ - 2 Score 3 0 0 0  PHQ- 9 Score 6 - - -     Cognitive Function     6CIT Screen 10/09/2018 10/01/2017 09/28/2016  What Year? 0 points 0 points 0 points  What month? 0 points 0 points 0 points  What time? 0 points 0 points 0 points  Count back from 20 0 points 2 points 0 points  Months in reverse 0 points  0 points 0 points  Repeat phrase 2 points 2 points 2 points  Total Score _0 Immunization History  Administered Date(s) Administered  . Influenza Split 07/17/2011  . Influenza Whole 06/18/2007, 07/29/2009, 05/16/2013  . Influenza, High Dose Seasonal PF 08/13/2018  . Influenza,inj,Quad PF,6+ Mos 05/19/2014, 08/22/2016, 05/17/2017  . Pneumococcal Conjugate-13 03/24/2014  . Pneumococcal Polysaccharide-23 05/20/2008  . Td 06/18/2002  . Tdap 07/17/2011    Qualifies for Shingles Vaccine? Ask insurance if covered   Screening Tests Health Maintenance  Topic Date Due  . OPHTHALMOLOGY EXAM  04/12/2018  . HEMOGLOBIN A1C  02/12/2019  . FOOT EXAM   04/07/2019  . TETANUS/TDAP  07/16/2021  . INFLUENZA VACCINE  Completed  . DEXA SCAN  Completed  . PNA vac Low Risk Adult  Completed    Cancer Screenings: Lung: Low Dose CT Chest recommended if Age 19-80 years, 30 pack-year currently smoking OR have quit w/in 15years. Patient does not qualify. Breast:  Up to date on Mammogram? Yes   Up to date of Bone Density/Dexa? Yes Colorectal: up to date   Additional Screenings: complete Hepatitis C Screening:      Plan:    I have personally reviewed and noted the following in the patient's chart:   . Medical and social history . Use of alcohol, tobacco or illicit drugs  . Current medications and supplements . Functional ability and status . Nutritional status . Physical activity . Advanced directives . List of other physicians . Hospitalizations, surgeries, and ER visits in previous 12 months . Vitals . Screenings to include cognitive, depression, and falls . Referrals and appointments  In addition, I have reviewed and discussed with patient certain preventive protocols, quality metrics, and best practice recommendations. A written personalized care plan for preventive services as well as general preventive health recommendations were provided to patient.     Kate Sable, LPN, LPN  [id number]

## 2018-10-23 ENCOUNTER — Ambulatory Visit (HOSPITAL_COMMUNITY)
Admission: RE | Admit: 2018-10-23 | Discharge: 2018-10-23 | Disposition: A | Discharge status: Home / Self Care | Payer: PPO | Source: Ambulatory Visit | Attending: Family Medicine | Admitting: Family Medicine

## 2018-10-23 DIAGNOSIS — Z1231 Encounter for screening mammogram for malignant neoplasm of breast: Secondary | ICD-10-CM | POA: Diagnosis not present

## 2018-11-01 ENCOUNTER — Other Ambulatory Visit: Payer: Self-pay | Admitting: Family Medicine

## 2018-11-21 ENCOUNTER — Other Ambulatory Visit: Payer: Self-pay | Admitting: "Endocrinology

## 2018-11-25 DIAGNOSIS — Z794 Long term (current) use of insulin: Secondary | ICD-10-CM | POA: Diagnosis not present

## 2018-11-25 DIAGNOSIS — N183 Chronic kidney disease, stage 3 (moderate): Secondary | ICD-10-CM | POA: Diagnosis not present

## 2018-11-25 DIAGNOSIS — E1122 Type 2 diabetes mellitus with diabetic chronic kidney disease: Secondary | ICD-10-CM | POA: Diagnosis not present

## 2018-11-26 ENCOUNTER — Encounter: Payer: Self-pay | Admitting: "Endocrinology

## 2018-11-26 ENCOUNTER — Ambulatory Visit (INDEPENDENT_AMBULATORY_CARE_PROVIDER_SITE_OTHER): Payer: PPO | Admitting: "Endocrinology

## 2018-11-26 VITALS — BP 124/77 | HR 74 | Ht 61.0 in | Wt 129.0 lb

## 2018-11-26 DIAGNOSIS — Z794 Long term (current) use of insulin: Secondary | ICD-10-CM | POA: Diagnosis not present

## 2018-11-26 DIAGNOSIS — I10 Essential (primary) hypertension: Secondary | ICD-10-CM | POA: Diagnosis not present

## 2018-11-26 DIAGNOSIS — N183 Chronic kidney disease, stage 3 unspecified: Secondary | ICD-10-CM

## 2018-11-26 DIAGNOSIS — E782 Mixed hyperlipidemia: Secondary | ICD-10-CM

## 2018-11-26 DIAGNOSIS — E1122 Type 2 diabetes mellitus with diabetic chronic kidney disease: Secondary | ICD-10-CM

## 2018-11-26 LAB — COMPLETE METABOLIC PANEL WITH GFR
AG Ratio: 1.7 (calc) (ref 1.0–2.5)
ALT: 11 U/L (ref 6–29)
AST: 19 U/L (ref 10–35)
Albumin: 3.9 g/dL (ref 3.6–5.1)
Alkaline phosphatase (APISO): 69 U/L (ref 37–153)
BUN/Creatinine Ratio: 21 (calc) (ref 6–22)
BUN: 25 mg/dL (ref 7–25)
CO2: 29 mmol/L (ref 20–32)
Calcium: 9.4 mg/dL (ref 8.6–10.4)
Chloride: 103 mmol/L (ref 98–110)
Creat: 1.19 mg/dL — ABNORMAL HIGH (ref 0.60–0.88)
GFR, Est African American: 49 mL/min/{1.73_m2} — ABNORMAL LOW (ref >=60)
GFR, Est Non African American: 42 mL/min/{1.73_m2} — ABNORMAL LOW (ref >=60)
Globulin: 2.3 g/dL (calc) (ref 1.9–3.7)
Glucose, Bld: 264 mg/dL — ABNORMAL HIGH (ref 65–139)
Potassium: 4.6 mmol/L (ref 3.5–5.3)
SODIUM: 140 mmol/L (ref 135–146)
Total Bilirubin: 0.5 mg/dL (ref 0.2–1.2)
Total Protein: 6.2 g/dL (ref 6.1–8.1)

## 2018-11-26 LAB — HEMOGLOBIN A1C
HEMOGLOBIN A1C: 7.3 %{Hb} — AB (ref <5.7)
MEAN PLASMA GLUCOSE: 163 (calc)
eAG (mmol/L): 9 (calc)

## 2018-11-26 MED ORDER — INSULIN ASPART PROT & ASPART (70-30 MIX) 100 UNIT/ML ~~LOC~~ SUSP: SUBCUTANEOUS | 2 refills | Status: DC

## 2018-11-26 NOTE — Progress Notes (Signed)
Endocrinology follow-up note  Subjective:    Patient ID: Emily Jennings, female    DOB: 03/18/1934,    Past Medical History:  Diagnosis Date  . Allergy   . Anxiety   . Arthritis   . Cancer [facility]) 2009   breast, carcinoma in situ left  . Carotid stenosis    11/16/2005  mild plaque formation and stenosis proximal right ECA  . Cataract   . Complication of anesthesia   . Coronary artery disease    cardiac catheterization on 03/20/2006  LAD mid 40% stenosis, left circumflex mild 40% stenosis, RCA mid-vessel 40% to 50% lesion   EF 60%  . Diabetes mellitus   . GERD (gastroesophageal reflux disease)   . Hernia, inguinal    left  . Hyperglycemia   . Hypertension   . Low blood potassium   . Non-insulin dependent type 2 diabetes mellitus ([place])   . Osteoporosis   . Shortness of breath    2D Echocardiogram 01/26/2009   EF of greater than 55%, mild MR, mild TR, normal ventricular function  . Ventricular tachycardia, non-sustained ([facility])    developed during stress test 02/08/2006, spontaneously aborted, mild reversible apical defect   Past Surgical History:  Procedure Laterality Date  . BREAST LUMPECTOMY Left 2009   Left breast 2009  . CATARACT EXTRACTION W/PHACO Left 10/28/2014   Procedure: PHACO EMULSION CATARACT EXTRACTION WITH INTRAOCULAR LENS IMPLANT LEFT EYE (IOC);  Surgeon: Marylynn Pearson, MD;  Location: [place];  Service: Ophthalmology;  Laterality: Left;  . COLONOSCOPY    . cyst removed from left foot     Social History   Socioeconomic History  . Marital status: Married    Spouse name: Not on file  . Number of children: 2  . Years of education: na  . Highest education level: Some college, no degree  Occupational History  . 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 Financial resource strain: Not hard at all  . Food insecurity:    Worry: Never true    Inability: Never true  . Transportation needs:    Medical: No    Non-medical: No  Tobacco Use  . Smoking  status: Never Smoker  . Smokeless tobacco: Never Used  Substance and Sexual Activity  . Alcohol use: No  . Drug use: No  . Sexual activity: Not Currently    Birth control/protection: Post-menopausal  Lifestyle  . Physical activity:    Days per week: 2 days    Minutes per session: 50 min  . Stress: Not at all  Relationships  . Social connections:    Talks on phone: More than three times a week    Gets together: Three times a week    Attends religious service: More than 4 times per year    Active member of club or organization: Yes    Attends meetings of clubs or organizations: More than 4 times per year    Relationship status: Married  Other Topics Concern  . Not on file  Social History Narrative   Lives with her husband and son and attends yoga at the [employer] [facility] [employer] 2 days a week and speaks to her grandson nightly on the phone   Outpatient Encounter Medications as of 11/26/2018  Medication Sig  . acetaminophen (TYLENOL) 500 MG tablet Take one tablet two times daily for 3 days, then as needed  . alendronate (FOSAMAX) 70 MG tablet TAKE 1 TABLET BY MOUTH EVERY 7 DAYS. TAKE WITH A FULL  GLASS OF WATER ON AN EMPTY STOMACH  . amLODipine (NORVASC) 10 MG tablet Take 1 tablet (10 mg total) by mouth daily.  Marland Kitchen aspirin EC 81 MG tablet Take 81 mg by mouth daily.  . B-D ULTRAFINE III SHORT PEN 31G X 8 MM MISC USE AS DIRECTED  . BD INSULIN SYRINGE U/F 31G X 5/16" 0.5 ML MISC USE AS DIRECTED TWICE DAILY  . blood glucose meter kit and supplies KIT One touch Ultra. Use up to two times daily as directed. (FOR ICD E11.65)  . calcium-vitamin D (OSCAL WITH D) 500-200 MG-UNIT per tablet Take 1 tablet by mouth daily with breakfast.   . insulin aspart protamine- aspart (NOVOLOG MIX 70/30) (70-30) 100 UNIT/ML injection INJECT 10 UNITS WITH BREAKFAST AND 10 UNITS WITH SUPPER WHEN BLOOD GLUCOSE IS ABOVE 90.  Marland Kitchen KLOR-CON M20 20 MEQ tablet TAKE 1 TABLET BY MOUTH TWICE A DAY  . losartan (COZAAR) 50 MG tablet Take 1 tablet  (50 mg total) by mouth daily.  . metoprolol tartrate (LOPRESSOR) 50 MG tablet Take 1 tablet (50 mg total) by mouth 2 (two) times daily.  . montelukast (SINGULAIR) 10 MG tablet Take 1 tablet (10 mg total) by mouth at bedtime.  Marland Kitchen omega-3 acid ethyl esters (LOVAZA) 1 g capsule Take by mouth 2 (two) times daily.  . ONE TOUCH ULTRA TEST test strip USE TO TEST BLOOD SUGAR 4 TIMES A DAY (E11.65)  . ONETOUCH DELICA LANCETS 16X MISC USE TO OBTAIN A BLOOD SPECIMEN 2 TIMES A DAY  . potassium chloride SA (KLOR-CON M20) 20 MEQ tablet Take 1 tablet (20 mEq total) by mouth 2 (two) times daily.  . rosuvastatin (CRESTOR) 5 MG tablet Take 1 tablet (5 mg total) by mouth at bedtime.  Marland Kitchen spironolactone (ALDACTONE) 25 MG tablet TAKE 1 TABLET BY MOUTH TWICE A DAY  . tiZANidine (ZANAFLEX) 2 MG tablet Take one tablet at bedtime , as needed, for neck spasm  . [DISCONTINUED] Albuterol Sulfate (PROAIR RESPICLICK) 096 (90 Base) MCG/ACT AEPB Inhale 2 puffs into the lungs every 6 (six) hours as needed.  . [DISCONTINUED] benzonatate (TESSALON) 100 MG capsule Take 1 capsule (100 mg total) by mouth 2 (two) times daily as needed for cough.  . [DISCONTINUED] NOVOLOG MIX 70/30 (70-30) 100 UNIT/ML injection INJECT 15 UNITS WITH BREAKFAST AND 10 UNITS WITH SUPPER WHEN BLOOD GLUCOSE IS ABOVE 90.   No facility-administered encounter medications on file as of 11/26/2018.    ALLERGIES: Allergies  Allergen Reactions  . Benadryl [Diphenhydramine Hcl] Hypertension  . Citalopram   . Metformin And Related Diarrhea  . Tramadol Other (See Comments)    Felt light headed and dizzy  . Tylenol [Acetaminophen] Other (See Comments)    Patient stated tylenol makes her "feel high"   VACCINATION STATUS: Immunization History  Administered Date(s) Administered  . Influenza Split 07/17/2011  . Influenza Whole 06/18/2007, 07/29/2009, 05/16/2013  . Influenza, High Dose Seasonal PF 08/13/2018  . Influenza,inj,Quad PF,6+ Mos 05/19/2014, 08/22/2016,  05/17/2017  . Pneumococcal Conjugate-13 03/24/2014  . Pneumococcal Polysaccharide-23 05/20/2008  . Td 06/18/2002  . Tdap 07/17/2011    Diabetes  She presents for her follow-up diabetic visit. She has type 2 diabetes mellitus. Onset time: She was diagnosed at approximate age of 76 years. Her disease course has been improving. There are no hypoglycemic associated symptoms. Pertinent negatives for hypoglycemia include no confusion, headaches, pallor or seizures. There are no diabetic associated symptoms. Pertinent negatives for diabetes include no chest pain, no fatigue, no polydipsia,  no polyphagia and no polyuria. There are no hypoglycemic complications. Symptoms are improving. Diabetic complications include nephropathy. Risk factors for coronary artery disease include diabetes mellitus, dyslipidemia, hypertension and sedentary lifestyle. Current diabetic treatment includes insulin injections. She is compliant with treatment some of the time. Her weight is fluctuating minimally. She is following a generally unhealthy diet. She has had a previous visit with a dietitian. She never participates in exercise. Her home blood glucose trend is fluctuating minimally. Her breakfast blood glucose range is generally 140-180 mg/dl. Her lunch blood glucose range is generally 140-180 mg/dl. Her dinner blood glucose range is generally 140-180 mg/dl. Her overall blood glucose range is 140-180 mg/dl. An ACE inhibitor/angiotensin II receptor blocker is being taken.  Hyperlipidemia  This is a chronic problem. The current episode started more than 1 year ago. The problem is controlled. Exacerbating diseases include diabetes. Pertinent negatives include no chest pain, myalgias or shortness of breath. Risk factors for coronary artery disease include diabetes mellitus, dyslipidemia and a sedentary lifestyle.  Hypertension  This is a chronic problem. The current episode started more than 1 year ago. Pertinent negatives include no  chest pain, headaches, palpitations or shortness of breath. Risk factors for coronary artery disease include diabetes mellitus, dyslipidemia and sedentary lifestyle. Past treatments include angiotensin blockers.      Review of Systems  Constitutional: Negative for fatigue and unexpected weight change.  HENT: Negative for trouble swallowing and voice change.   Eyes: Negative for visual disturbance.  Respiratory: Negative for cough, shortness of breath and wheezing.   Cardiovascular: Negative for chest pain, palpitations and leg swelling.  Gastrointestinal: Negative for diarrhea, nausea and vomiting.  Endocrine: Negative for cold intolerance, heat intolerance, polydipsia, polyphagia and polyuria.  Musculoskeletal: Negative for arthralgias and myalgias.  Skin: Negative for color change, pallor, rash and wound.  Neurological: Negative for seizures and headaches.  Psychiatric/Behavioral: Negative for confusion and suicidal ideas.    Objective:    BP 124/77   Pulse 74   Ht 5' 1" (1.549 m)   Wt 129 lb (58.5 kg)   BMI 24.37 kg/m   Wt Readings from Last 3 Encounters:  11/26/18 129 lb (58.5 kg)  10/09/18 127 lb (57.6 kg)  09/03/18 131 lb (59.4 kg)    Physical Exam  Constitutional: She is oriented to person, place, and time. She appears well-developed.  HENT:  Head: Normocephalic and atraumatic.  Eyes: EOM are normal.  Neck: Normal range of motion. Neck supple. No tracheal deviation present. No thyromegaly present.  Cardiovascular: Normal rate and regular rhythm.  Pulmonary/Chest: Effort normal.  Abdominal: There is no abdominal tenderness. There is no guarding.  Musculoskeletal: Normal range of motion.        General: No edema.  Neurological: She is alert and oriented to person, place, and time. No cranial nerve deficit. Coordination normal.  Skin: No rash noted. No erythema. No pallor.  Psychiatric: She has a normal mood and affect. Judgment normal.    Results for orders placed  or performed in visit on 08/27/18  Hemoglobin A1c  Result Value Ref Range   Hgb A1c MFr Bld 7.3 (H) <5.7 % of total Hgb   Mean Plasma Glucose 163 (calc)   eAG (mmol/L) 9.0 (calc)  COMPLETE METABOLIC PANEL WITH GFR  Result Value Ref Range   Glucose, Bld 264 (H) 65 - 139 mg/dL   BUN 25 7 - 25 mg/dL   Creat 1.19 (H) 0.60 - 0.88 mg/dL   GFR, Est Non African American  42 (L) > OR = 60 mL/min/1.71m   GFR, Est African American 49 (L) > OR = 60 mL/min/1.770m  BUN/Creatinine Ratio 21 6 - 22 (calc)   Sodium 140 135 - 146 mmol/L   Potassium 4.6 3.5 - 5.3 mmol/L   Chloride 103 98 - 110 mmol/L   CO2 29 20 - 32 mmol/L   Calcium 9.4 8.6 - 10.4 mg/dL   Total Protein 6.2 6.1 - 8.1 g/dL   Albumin 3.9 3.6 - 5.1 g/dL   Globulin 2.3 1.9 - 3.7 g/dL (calc)   AG Ratio 1.7 1.0 - 2.5 (calc)   Total Bilirubin 0.5 0.2 - 1.2 mg/dL   Alkaline phosphatase (APISO) 69 37 - 153 U/L   AST 19 10 - 35 U/L   ALT 11 6 - 29 U/L   Complete Blood Count (Most recent): Lab Results  Component Value Date   WBC 5.9 05/16/2017   HGB 12.9 05/16/2017   HCT 40.1 05/16/2017   MCV 93.9 05/16/2017   PLT 331 05/16/2017   Chemistry (most recent): Lab Results  Component Value Date   NA 140 11/25/2018   K 4.6 11/25/2018   CL 103 11/25/2018   CO2 29 11/25/2018   BUN 25 11/25/2018   CREATININE 1.19 (H) 11/25/2018   Diabetic Labs (most recent): Lab Results  Component Value Date   HGBA1C 7.3 (H) 11/25/2018   HGBA1C 7.7 (H) 08/14/2018   HGBA1C 7.7 (H) 11/27/2017   HGBA1C CANCELED 11/27/2017   Lipid Panel     Component Value Date/Time   CHOL 202 (H) 08/14/2018 0948   TRIG 44 08/14/2018 0948   HDL 106 08/14/2018 0948   CHOLHDL 1.9 08/14/2018 0948   VLDL 8 10/31/2016 0845   LDLCALC 83 08/14/2018 0948     Assessment & Plan:   1. Type 2 diabetes mellitus with stage 3 chronic kidney disease, with long-term current use of insulin   Patient came with generally improving glycemic profile, has random hypoglycemic  episodes.   -Her previsit labs showed her A1c is 7.3%, generally improving from 9.2%.  Her recent labs were reviewed with her.     Patient remains at a high risk for more acute and chronic complications of diabetes which include CAD, CVA, CKD, retinopathy, and neuropathy. These are all discussed in detail with the patient. I have re-counseled the patient on diet management and weight loss, by adopting a carbohydrate restricted / protein rich  Diet.  - Patient admits there is a room for improvement in her diet and drink choices. -  Suggestion is made for her to avoid simple carbohydrates  from her diet including Cakes, Sweet Desserts / Pastries, Ice Cream, Soda (diet and regular), Sweet Tea, Candies, Chips, Cookies, Store Bought Juices, Alcohol in Excess of  1-2 drinks a day, Artificial Sweeteners, and "Sugar-free" Products. This will help patient to have stable blood glucose profile and potentially avoid unintended weight gain.  I have approached patient to continue on  intensive monitoring of blood glucose and insulin therapy, and patient agrees.   - She continued to do fairly well on premixed insulin.  - Given her rare and random hypoglycemic episodes, advised her to lower her NovoLog 70/30 to 10 units with breakfast and 10 units with supper when pre-meal glucose is above 90 mg/dL . - She is warned not to inject insulin without proper monitoring of blood glucose. She is advised to continue monitoring her blood glucose 4 times a day-before meals and at bedtime.    -  Patient is encouraged to call clinic for blood glucose levels less than 70 or above 300 mg /dl.  -Target numbers for A1c, LDL, HDL, Triglycerides, Waist Circumference were discussed in detail.  2) HTN: Her blood pressure is controlled to target.   She is advised to continue her current blood pressure medications including spironolactone 25 mg daily, losartan 50 mg p.o. daily.    3. Lipids: Her most recent lipid panel showed LDL at  98.  She has not tolerated statins.  She is advised to stay on omega-3 fatty acids.  4)  Weight/Diet: CDE consult in progress, exercise, and carbs information provided.   4) Chronic Care:  -Patient is on ARB  medications and encouraged to continue to follow up with Ophthalmology, Podiatrist at least yearly or according to recommendations, and advised to stay away from smoking. I have recommended yearly flu vaccine and pneumonia vaccination at least every 5 years; and  sleep for at least 7 hours a day.  - Time spent with the patient: 25 min, of which >50% was spent in reviewing her blood glucose logs , discussing her hypoglycemia and hyperglycemia episodes, reviewing her current and  previous labs / studies and medications  doses and developing a plan to avoid hypoglycemia and hyperglycemia. Please refer to Patient Instructions for Blood Glucose Monitoring and Insulin/Medications Dosing Guide"  in media tab for additional information. Please  also refer to " Patient Self Inventory" in the Media  tab for reviewed elements of pertinent patient history.  Liany Hewitt Shorts participated in the discussions, expressed understanding, and voiced agreement with the above plans.  All questions were answered to her satisfaction. she is encouraged to contact clinic should she have any questions or concerns prior to her return visit.   Follow up plan: No follow-ups on file.  Glade Lloyd, MD Phone: [phone]  Fax: [phone]  This note was partially dictated with voice recognition software. Similar sounding words can be transcribed inadequately or may not  be corrected upon review.  11/26/2018, 4:48 PM

## 2018-11-26 NOTE — Patient Instructions (Signed)

## 2018-12-25 ENCOUNTER — Other Ambulatory Visit: Payer: Self-pay

## 2018-12-25 ENCOUNTER — Ambulatory Visit: Payer: PPO | Admitting: Family Medicine

## 2018-12-26 ENCOUNTER — Other Ambulatory Visit: Payer: Self-pay

## 2018-12-26 ENCOUNTER — Ambulatory Visit: Payer: PPO | Admitting: Family Medicine

## 2019-01-23 ENCOUNTER — Ambulatory Visit (INDEPENDENT_AMBULATORY_CARE_PROVIDER_SITE_OTHER): Payer: PPO | Admitting: Family Medicine

## 2019-01-23 ENCOUNTER — Encounter: Payer: Self-pay | Admitting: Family Medicine

## 2019-01-23 VITALS — BP 124/77 | Ht 61.0 in | Wt 129.0 lb

## 2019-01-23 DIAGNOSIS — E782 Mixed hyperlipidemia: Secondary | ICD-10-CM | POA: Diagnosis not present

## 2019-01-23 DIAGNOSIS — Z794 Long term (current) use of insulin: Secondary | ICD-10-CM

## 2019-01-23 DIAGNOSIS — N183 Chronic kidney disease, stage 3 unspecified: Secondary | ICD-10-CM

## 2019-01-23 DIAGNOSIS — E1122 Type 2 diabetes mellitus with diabetic chronic kidney disease: Secondary | ICD-10-CM | POA: Diagnosis not present

## 2019-01-23 DIAGNOSIS — I10 Essential (primary) hypertension: Secondary | ICD-10-CM | POA: Diagnosis not present

## 2019-01-23 DIAGNOSIS — J3089 Other allergic rhinitis: Secondary | ICD-10-CM | POA: Diagnosis not present

## 2019-01-23 MED ORDER — LOSARTAN POTASSIUM 50 MG PO TABS: 50.0000 mg | ORAL_TABLET | Freq: Every day | ORAL | 3 refills | Status: DC

## 2019-01-23 NOTE — Progress Notes (Signed)
Virtual Visit via Telephone Note  I connected with Emily Jennings on 01/23/19 at  9:40 AM EDT by telephone and verified that I am speaking with the correct person using two identifiers.  Location: Patient: home Provider: office   I discussed the limitations, risks, security and privacy concerns of performing an evaluation and management service by telephone and the availability of in person appointments. I also discussed with the patient that there may be a patient responsible charge related to this service. The patient expressed understanding and agreed to proceed.   History of Present Illness: F/zu chronic problems and medication review and update States she has not taken cozaar for 3 months, but will fill today Denies adverse s/e from the medication cozaar states it was unavailable when she tried to refill, never called to notify us and I have no recall of a shortage Hs concerns that spironolactone is causing dehydration, I recommended she continue and that her most recent lab did  Not show this, and she should ensure adequate  Water intake Feels as though she is doing well overall, Had an excellent diabetes report recently and is given a 6 month f/u with dose reduction in insulin Increased sinus congestion and clear runny nose in the past 1 month, but prefers oTC medication for thi over prescription so she will buy this Denies recent fever or chills.  Denies chest congestion, productive cough or wheezing. Denies chest pains, palpitations and leg swelling Denies abdominal pain, nausea, vomiting,diarrhea or constipation.   Denies dysuria, frequency, hesitancy or incontinence. Denies joint pain, swelling and limitation in mobility. Denies headaches, seizures, numbness, or tingling. Denies depression, anxiety or insomnia. Denies skin break down or rash.       Observations/Objective: BP 124/77   Ht 5\' 1"  (1.549 m)   Wt 129 lb (58.5 kg)   BMI 24.37 kg/m  Good communication with no  confusion and intact memory. Alert and oriented x 3 No signs of respiratory distress during sppech    Assessment and Plan: Essential hypertension Controlled, no change in medication DASH diet and commitment to daily physical activity for a minimum of 30 minutes discussed and encouraged, as a part of hypertension management. The importance of attaining a healthy weight is also discussed.  BP/Weight 01/23/2019 11/26/2018 10/09/2018 09/03/2018 08/27/2018 08/27/2018 33/04/2504  Systolic BP 397 673 419 379 024 097 -  Diastolic BP 77 77 72 73 70 65 -  Wt. (Lbs) 129 129 127 131 127.04 131 131.8  BMI 24.37 24.37 24.8 25.58 24 24.75 25.74   Needs to resume cozaar as controlled on this and necessary for renal protection    Mixed hyperlipidemia Hyperlipidemia:Low fat diet discussed and encouraged.   Lipid Panel  Lab Results  Component Value Date   CHOL 202 (H) 08/14/2018   HDL 106 08/14/2018   LDLCALC 83 08/14/2018   TRIG 44 08/14/2018   CHOLHDL 1.9 08/14/2018  Updated lab needed at/ before next visit.      Type 2 diabetes mellitus with stage 3 chronic kidney disease, with long-term current use of insulin ([place]) Controlled  Well and managed by Endo. Ms. Moise is reminded of the importance of commitment to daily physical activity for 30 minutes or more, as able and the need to limit carbohydrate intake to 30 to 60 grams per meal to help with blood sugar control.   The need to take medication as prescribed, test blood sugar as directed, and to call between visits if there is a concern that blood  sugar is uncontrolled is also discussed.   Ms. Roanhorse is reminded of the importance of daily foot exam, annual eye examination, and good blood sugar, blood pressure and cholesterol control.  Diabetic Labs Latest Ref Rng & Units 11/25/2018 08/14/2018 03/29/2018 12/05/2017 11/27/2017  HbA1c <5.7 % of total Hgb 7.3(H) 7.7(H) - - CANCELED  Microalbumin Not Estab. ug/mL - - - 1,218.8(H) -  Micro/Creat  Ratio 0.0 - 30.0 mg/g creat - - - 783.3(H) -  Chol <200 mg/dL - 202(H) 214(H) - -  HDL >50 mg/dL - 106 106 - -  Calc LDL mg/dL (calc) - 83 98 - -  Triglycerides <150 mg/dL - 44 33 - -  Creatinine 0.60 - 0.88 mg/dL 1.19(H) 1.18(H) - - -   BP/Weight 01/23/2019 11/26/2018 10/09/2018 09/03/2018 08/27/2018 08/27/2018 15/09/7614  Systolic BP 073 710 626 948 546 270 -  Diastolic BP 77 77 72 73 70 65 -  Wt. (Lbs) 129 129 127 131 127.04 131 131.8  BMI 24.37 24.37 24.8 25.58 24 24.75 25.74   Foot/eye exam completion dates Latest Ref Rng & Units 04/03/2018 03/19/2017  Eye Exam No Retinopathy - -  Foot Form Completion - Done Done         Allergic rhinitis Currently having symptom flare , which is not uncommon during the Spring, pt will start OTC medication of her choice , advised daily use    Follow Up Instructions:    I discussed the assessment and treatment plan with the patient. The patient was provided an opportunity to ask questions and all were answered. The patient agreed with the plan and demonstrated an understanding of the instructions.   The patient was advised to call back or seek an in-person evaluation if the symptoms worsen or if the condition fails to improve as anticipated I provided 25 minutes of non-face-to-face time during this encounter.   Tula Nakayama, MD

## 2019-01-23 NOTE — Assessment & Plan Note (Signed)
Hyperlipidemia:Low fat diet discussed and encouraged.   Lipid Panel  Lab Results  Component Value Date   CHOL 202 (H) 08/14/2018   HDL 106 08/14/2018   LDLCALC 83 08/14/2018   TRIG 44 08/14/2018   CHOLHDL 1.9 08/14/2018  Updated lab needed at/ before next visit.

## 2019-01-23 NOTE — Assessment & Plan Note (Signed)
Controlled, no change in medication DASH diet and commitment to daily physical activity for a minimum of 30 minutes discussed and encouraged, as a part of hypertension management. The importance of attaining a healthy weight is also discussed.  BP/Weight 01/23/2019 11/26/2018 10/09/2018 09/03/2018 08/27/2018 08/27/2018 79/10/1781  Systolic BP 754 237 023 017 209 106 -  Diastolic BP 77 77 72 73 70 65 -  Wt. (Lbs) 129 129 127 131 127.04 131 131.8  BMI 24.37 24.37 24.8 25.58 24 24.75 25.74   Needs to resume cozaar as controlled on this and necessary for renal protection

## 2019-01-23 NOTE — Patient Instructions (Addendum)
Annual physical exam with MD end October, call if you need me sooner  Please resume cozaar today , this helps to protect your heart, brain and kidneys Stay on same dose of spironolactone  Fasting lipid, cmp and EGFr, and microalb first week in June ( [facility])  Congrats on excellent blood sugar control  Any OTC allergy medication, allegra, claritin or zyrtec   Social distancing. Frequent hand washing with soap and water Keeping your hands off of your face.Wear a mask and maintain a 6 ft distance These 3 practices will help to keep both you and your community healthy during this time. Please practice them faithfully!  Thanks for choosing [facility], we consider it a privelige to serve you.

## 2019-01-23 NOTE — Assessment & Plan Note (Signed)
Controlled  Well and managed by Endo. Emily Jennings is reminded of the importance of commitment to daily physical activity for 30 minutes or more, as able and the need to limit carbohydrate intake to 30 to 60 grams per meal to help with blood sugar control.   The need to take medication as prescribed, test blood sugar as directed, and to call between visits if there is a concern that blood sugar is uncontrolled is also discussed.   Emily Jennings is reminded of the importance of daily foot exam, annual eye examination, and good blood sugar, blood pressure and cholesterol control.  Diabetic Labs Latest Ref Rng & Units 11/25/2018 08/14/2018 03/29/2018 12/05/2017 11/27/2017  HbA1c <5.7 % of total Hgb 7.3(H) 7.7(H) - - CANCELED  Microalbumin Not Estab. ug/mL - - - 1,218.8(H) -  Micro/Creat Ratio 0.0 - 30.0 mg/g creat - - - 783.3(H) -  Chol <200 mg/dL - 202(H) 214(H) - -  HDL >50 mg/dL - 106 106 - -  Calc LDL mg/dL (calc) - 83 98 - -  Triglycerides <150 mg/dL - 44 33 - -  Creatinine 0.60 - 0.88 mg/dL 1.19(H) 1.18(H) - - -   BP/Weight 01/23/2019 11/26/2018 10/09/2018 09/03/2018 08/27/2018 08/27/2018 33/11/8327  Systolic BP 191 660 600 459 977 414 -  Diastolic BP 77 77 72 73 70 65 -  Wt. (Lbs) 129 129 127 131 127.04 131 131.8  BMI 24.37 24.37 24.8 25.58 24 24.75 25.74   Foot/eye exam completion dates Latest Ref Rng & Units 04/03/2018 03/19/2017  Eye Exam No Retinopathy - -  Foot Form Completion - Done Done

## 2019-01-23 NOTE — Assessment & Plan Note (Signed)
Currently having symptom flare , which is not uncommon during the Spring, pt will start OTC medication of her choice , advised daily use

## 2019-02-11 ENCOUNTER — Other Ambulatory Visit: Payer: Self-pay | Admitting: Family Medicine

## 2019-02-23 ENCOUNTER — Other Ambulatory Visit: Payer: Self-pay | Admitting: Family Medicine

## 2019-03-13 ENCOUNTER — Encounter: Payer: Self-pay | Admitting: *Deleted

## 2019-04-03 DIAGNOSIS — Z794 Long term (current) use of insulin: Secondary | ICD-10-CM | POA: Diagnosis not present

## 2019-04-03 DIAGNOSIS — N183 Chronic kidney disease, stage 3 (moderate): Secondary | ICD-10-CM | POA: Diagnosis not present

## 2019-04-03 DIAGNOSIS — E782 Mixed hyperlipidemia: Secondary | ICD-10-CM | POA: Diagnosis not present

## 2019-04-03 DIAGNOSIS — E1122 Type 2 diabetes mellitus with diabetic chronic kidney disease: Secondary | ICD-10-CM | POA: Diagnosis not present

## 2019-04-04 LAB — MICROALBUMIN / CREATININE URINE RATIO
Creatinine, Urine: 21 mg/dL (ref 20–275)
Microalb Creat Ratio: 1090 mcg/mg creat — ABNORMAL HIGH (ref <30)
Microalb, Ur: 22.9 mg/dL

## 2019-04-04 LAB — LIPID PANEL
Cholesterol: 164 mg/dL (ref <200)
HDL: 92 mg/dL (ref >=50)
LDL Cholesterol (Calc): 59 mg/dL (calc)
Non-HDL Cholesterol (Calc): 72 mg/dL (calc) (ref <130)
Total CHOL/HDL Ratio: 1.8 (calc) (ref <5.0)
Triglycerides: 44 mg/dL (ref <150)

## 2019-04-04 LAB — COMPLETE METABOLIC PANEL WITH GFR
AG Ratio: 1.8 (calc) (ref 1.0–2.5)
ALT: 13 U/L (ref 6–29)
AST: 20 U/L (ref 10–35)
Albumin: 4.1 g/dL (ref 3.6–5.1)
Alkaline phosphatase (APISO): 66 U/L (ref 37–153)
BUN/Creatinine Ratio: 15 (calc) (ref 6–22)
BUN: 17 mg/dL (ref 7–25)
CO2: 28 mmol/L (ref 20–32)
Calcium: 9.5 mg/dL (ref 8.6–10.4)
Chloride: 97 mmol/L — ABNORMAL LOW (ref 98–110)
Creat: 1.11 mg/dL — ABNORMAL HIGH (ref 0.60–0.88)
GFR, Est African American: 53 mL/min/{1.73_m2} — ABNORMAL LOW (ref >=60)
GFR, Est Non African American: 46 mL/min/{1.73_m2} — ABNORMAL LOW (ref >=60)
Globulin: 2.3 g/dL (calc) (ref 1.9–3.7)
Glucose, Bld: 159 mg/dL — ABNORMAL HIGH (ref 65–99)
Potassium: 4.5 mmol/L (ref 3.5–5.3)
Sodium: 133 mmol/L — ABNORMAL LOW (ref 135–146)
Total Bilirubin: 0.5 mg/dL (ref 0.2–1.2)
Total Protein: 6.4 g/dL (ref 6.1–8.1)

## 2019-04-07 ENCOUNTER — Other Ambulatory Visit: Payer: Self-pay | Admitting: "Endocrinology

## 2019-04-07 ENCOUNTER — Encounter: Payer: Self-pay | Admitting: Family Medicine

## 2019-04-09 DIAGNOSIS — E119 Type 2 diabetes mellitus without complications: Secondary | ICD-10-CM | POA: Diagnosis not present

## 2019-04-09 DIAGNOSIS — H40023 Open angle with borderline findings, high risk, bilateral: Secondary | ICD-10-CM | POA: Diagnosis not present

## 2019-04-09 DIAGNOSIS — H35363 Drusen (degenerative) of macula, bilateral: Secondary | ICD-10-CM | POA: Diagnosis not present

## 2019-04-09 DIAGNOSIS — Z961 Presence of intraocular lens: Secondary | ICD-10-CM | POA: Diagnosis not present

## 2019-04-21 ENCOUNTER — Other Ambulatory Visit: Payer: Self-pay | Admitting: Family Medicine

## 2019-04-27 ENCOUNTER — Other Ambulatory Visit: Payer: Self-pay | Admitting: "Endocrinology

## 2019-05-15 ENCOUNTER — Encounter: Payer: Self-pay | Admitting: *Deleted

## 2019-05-27 DIAGNOSIS — N183 Chronic kidney disease, stage 3 (moderate): Secondary | ICD-10-CM | POA: Diagnosis not present

## 2019-05-27 DIAGNOSIS — E1122 Type 2 diabetes mellitus with diabetic chronic kidney disease: Secondary | ICD-10-CM | POA: Diagnosis not present

## 2019-05-27 DIAGNOSIS — Z794 Long term (current) use of insulin: Secondary | ICD-10-CM | POA: Diagnosis not present

## 2019-05-28 LAB — COMPLETE METABOLIC PANEL WITH GFR
AG Ratio: 1.6 (calc) (ref 1.0–2.5)
ALT: 15 U/L (ref 6–29)
AST: 24 U/L (ref 10–35)
Albumin: 3.9 g/dL (ref 3.6–5.1)
Alkaline phosphatase (APISO): 66 U/L (ref 37–153)
BUN/Creatinine Ratio: 19 (calc) (ref 6–22)
BUN: 23 mg/dL (ref 7–25)
CO2: 27 mmol/L (ref 20–32)
Calcium: 9.6 mg/dL (ref 8.6–10.4)
Chloride: 102 mmol/L (ref 98–110)
Creat: 1.24 mg/dL — ABNORMAL HIGH (ref 0.60–0.88)
GFR, Est African American: 47 mL/min/{1.73_m2} — ABNORMAL LOW (ref >=60)
GFR, Est Non African American: 40 mL/min/{1.73_m2} — ABNORMAL LOW (ref >=60)
Globulin: 2.4 g/dL (calc) (ref 1.9–3.7)
Glucose, Bld: 207 mg/dL — ABNORMAL HIGH (ref 65–99)
Potassium: 4.5 mmol/L (ref 3.5–5.3)
Sodium: 138 mmol/L (ref 135–146)
Total Bilirubin: 0.4 mg/dL (ref 0.2–1.2)
Total Protein: 6.3 g/dL (ref 6.1–8.1)

## 2019-05-28 LAB — HEMOGLOBIN A1C
Hgb A1c MFr Bld: 7.4 % of total Hgb — ABNORMAL HIGH (ref <5.7)
Mean Plasma Glucose: 166 (calc)
eAG (mmol/L): 9.2 (calc)

## 2019-05-29 ENCOUNTER — Other Ambulatory Visit: Payer: Self-pay

## 2019-05-29 ENCOUNTER — Encounter: Payer: Self-pay | Admitting: "Endocrinology

## 2019-05-29 ENCOUNTER — Ambulatory Visit (INDEPENDENT_AMBULATORY_CARE_PROVIDER_SITE_OTHER): Payer: PPO | Admitting: "Endocrinology

## 2019-05-29 ENCOUNTER — Encounter (INDEPENDENT_AMBULATORY_CARE_PROVIDER_SITE_OTHER): Payer: Self-pay

## 2019-05-29 DIAGNOSIS — E1122 Type 2 diabetes mellitus with diabetic chronic kidney disease: Secondary | ICD-10-CM | POA: Diagnosis not present

## 2019-05-29 DIAGNOSIS — N183 Chronic kidney disease, stage 3 (moderate): Secondary | ICD-10-CM | POA: Diagnosis not present

## 2019-05-29 DIAGNOSIS — Z794 Long term (current) use of insulin: Secondary | ICD-10-CM

## 2019-05-29 DIAGNOSIS — E782 Mixed hyperlipidemia: Secondary | ICD-10-CM

## 2019-05-29 DIAGNOSIS — I10 Essential (primary) hypertension: Secondary | ICD-10-CM | POA: Diagnosis not present

## 2019-05-29 NOTE — Progress Notes (Signed)
05/29/2019                                                    Endocrinology Telehealth Visit Follow up Note -During COVID -19 Pandemic  This visit type was conducted due to national recommendations for restrictions regarding the COVID-19 Pandemic  in an effort to limit this patient's exposure and mitigate transmission of the corona virus.  Due to her co-morbid illnesses, Emily Jennings is at  moderate to high risk for complications without adequate follow up.  This format is felt to be most appropriate for her at this time.  I connected with this patient on 05/29/2019   by telephone and verified that I am speaking with the correct person using two identifiers. Emily Jennings, [birth year]. she has verbally consented to this visit. All issues noted in this document were discussed and addressed. The format was not optimal for physical exam.    Subjective:    Patient ID: Emily Jennings, female    DOB: 02-22-1934,    Past Medical History:  Diagnosis Date  . Allergy   . Anxiety   . Arthritis   . Cancer [facility]) 2009   breast, carcinoma in situ left  . Carotid stenosis    11/16/2005  mild plaque formation and stenosis proximal right ECA  . Cataract   . Complication of anesthesia   . Coronary artery disease    cardiac catheterization on 03/20/2006  LAD mid 40% stenosis, left circumflex mild 40% stenosis, RCA mid-vessel 40% to 50% lesion   EF 60%  . Diabetes mellitus   . GERD (gastroesophageal reflux disease)   . Hernia, inguinal    left  . Hyperglycemia   . Hypertension   . Low blood potassium   . Non-insulin dependent type 2 diabetes mellitus ([place])   . Osteoporosis   . Shortness of breath    2D Echocardiogram 01/26/2009   EF of greater than 55%, mild MR, mild TR, normal ventricular function  . Ventricular tachycardia, non-sustained (HCC)    developed during stress test 02/08/2006, spontaneously aborted, mild reversible apical defect   Past Surgical History:  Procedure Laterality Date  . BREAST  LUMPECTOMY Left 2009   Left breast 2009  . CATARACT EXTRACTION W/PHACO Left 10/28/2014   Procedure: PHACO EMULSION CATARACT EXTRACTION WITH INTRAOCULAR LENS IMPLANT LEFT EYE (IOC);  Surgeon: Marylynn Pearson, MD;  Location: [place];  Service: Ophthalmology;  Laterality: Left;  . COLONOSCOPY    . cyst removed from left foot     Social History   Socioeconomic History  . Marital status: Married    Spouse name: Not on file  . Number of children: 2  . Years of education: na  . Highest education level: Some college, no degree  Occupational History  . 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 Financial resource strain: Not hard at all  . Food insecurity    Worry: Never true    Inability: Never true  . Transportation needs    Medical: No    Non-medical: No  Tobacco Use  . Smoking status: Never Smoker  . Smokeless tobacco: Never Used  Substance and Sexual Activity  . Alcohol use: No  . Drug use: No  . Sexual activity: Not Currently    Birth control/protection: Post-menopausal  Lifestyle  .  Physical activity    Days per week: 2 days    Minutes per session: 50 min  . Stress: Not at all  Relationships  . Social connections    Talks on phone: More than three times a week    Gets together: Three times a week    Attends religious service: More than 4 times per year    Active member of club or organization: Yes    Attends meetings of clubs or organizations: More than 4 times per year    Relationship status: Married  Other Topics Concern  . Not on file  Social History Narrative   Lives with her husband and son and attends yoga at the [facility] 2 days a week and speaks to her grandson nightly on the phone   Outpatient Encounter Medications as of 05/29/2019  Medication Sig  . acetaminophen (TYLENOL) 500 MG tablet Take one tablet two times daily for 3 days, then as needed  . alendronate (FOSAMAX) 70 MG tablet TAKE 1 TABLET BY MOUTH EVERY 7 DAYS. TAKE WITH A FULL GLASS OF WATER  ON AN EMPTY STOMACH  . amLODipine (NORVASC) 10 MG tablet TAKE 1 TABLET BY MOUTH EVERY DAY  . aspirin EC 81 MG tablet Take 81 mg by mouth daily.  . B-D ULTRAFINE III SHORT PEN 31G X 8 MM MISC USE AS DIRECTED  . BD INSULIN SYRINGE U/F 31G X 5/16" 0.5 ML MISC USE AS DIRECTED TWICE DAILY  . blood glucose meter kit and supplies KIT One touch Ultra. Use up to two times daily as directed. (FOR ICD E11.65)  . cholecalciferol (VITAMIN D3) 25 MCG (1000 UT) tablet Take 1,000 Units by mouth daily.  . insulin aspart protamine- aspart (NOVOLOG MIX 70/30) (70-30) 100 UNIT/ML injection INJECT 10 UNITS WITH BREAKFAST AND 10 UNITS WITH SUPPER WHEN BLOOD GLUCOSE IS ABOVE 90.  Marland Kitchen KLOR-CON M20 20 MEQ tablet TAKE 1 TABLET BY MOUTH TWICE A DAY  . KLOR-CON M20 20 MEQ tablet TAKE 1 TABLET BY MOUTH TWICE A DAY  . losartan (COZAAR) 50 MG tablet Take 1 tablet (50 mg total) by mouth daily.  . metoprolol tartrate (LOPRESSOR) 50 MG tablet Take 1 tablet (50 mg total) by mouth 2 (two) times daily.  Marland Kitchen omega-3 acid ethyl esters (LOVAZA) 1 g capsule Take by mouth 2 (two) times daily.  Glory Rosebush DELICA LANCETS 81W MISC USE TO OBTAIN A BLOOD SPECIMEN 2 TIMES A DAY  . ONETOUCH ULTRA test strip USE TO TEST BLOOD SUGAR 4 TIMES A DAY (E11.65)  . rosuvastatin (CRESTOR) 5 MG tablet Take 1 tablet (5 mg total) by mouth at bedtime.  Marland Kitchen spironolactone (ALDACTONE) 25 MG tablet TAKE 1 TABLET BY MOUTH TWICE A DAY   No facility-administered encounter medications on file as of 05/29/2019.    ALLERGIES: Allergies  Allergen Reactions  . Benadryl [Diphenhydramine Hcl] Hypertension  . Citalopram   . Metformin And Related Diarrhea  . Tramadol Other (See Comments)    Felt light headed and dizzy  . Tylenol [Acetaminophen] Other (See Comments)    Patient stated tylenol makes her "feel high"   VACCINATION STATUS: Immunization History  Administered Date(s) Administered  . Influenza Split 07/17/2011  . Influenza Whole 06/18/2007, 07/29/2009,  05/16/2013  . Influenza, High Dose Seasonal PF 08/13/2018  . Influenza,inj,Quad PF,6+ Mos 05/19/2014, 08/22/2016, 05/17/2017  . Pneumococcal Conjugate-13 03/24/2014  . Pneumococcal Polysaccharide-23 05/20/2008  . Td 06/18/2002  . Tdap 07/17/2011    Diabetes She presents for her follow-up diabetic visit.  She has type 2 diabetes mellitus. Onset time: She was diagnosed at approximate age of 77 years. Her disease course has been stable. There are no hypoglycemic associated symptoms. Pertinent negatives for hypoglycemia include no confusion, headaches, pallor or seizures. There are no diabetic associated symptoms. Pertinent negatives for diabetes include no chest pain, no fatigue, no polydipsia, no polyphagia and no polyuria. There are no hypoglycemic complications. Symptoms are stable. Diabetic complications include nephropathy. Risk factors for coronary artery disease include diabetes mellitus, dyslipidemia, hypertension and sedentary lifestyle. Current diabetic treatment includes insulin injections. She is compliant with treatment some of the time. She is following a generally unhealthy diet. She has had a previous visit with a dietitian. She never participates in exercise. Her home blood glucose trend is fluctuating minimally. Her breakfast blood glucose range is generally 140-180 mg/dl. Her dinner blood glucose range is generally 140-180 mg/dl. Her overall blood glucose range is 140-180 mg/dl. An ACE inhibitor/angiotensin II receptor blocker is being taken.  Hyperlipidemia This is a chronic problem. The current episode started more than 1 year ago. The problem is controlled. Exacerbating diseases include diabetes. Pertinent negatives include no chest pain, myalgias or shortness of breath. Risk factors for coronary artery disease include diabetes mellitus, dyslipidemia and a sedentary lifestyle.  Hypertension This is a chronic problem. The current episode started more than 1 year ago. Pertinent  negatives include no chest pain, headaches, palpitations or shortness of breath. Risk factors for coronary artery disease include diabetes mellitus, dyslipidemia and sedentary lifestyle. Past treatments include angiotensin blockers.      Objective:    There were no vitals taken for this visit.  Wt Readings from Last 3 Encounters:  01/23/19 129 lb (58.5 kg)  11/26/18 129 lb (58.5 kg)  10/09/18 127 lb (57.6 kg)      Results for orders placed or performed in visit on 01/23/19  Lipid panel  Result Value Ref Range   Cholesterol 164 <200 mg/dL   HDL 92 > OR = 50 mg/dL   Triglycerides 44 <150 mg/dL   LDL Cholesterol (Calc) 59 mg/dL (calc)   Total CHOL/HDL Ratio 1.8 <5.0 (calc)   Non-HDL Cholesterol (Calc) 72 <130 mg/dL (calc)  COMPLETE METABOLIC PANEL WITH GFR  Result Value Ref Range   Glucose, Bld 159 (H) 65 - 99 mg/dL   BUN 17 7 - 25 mg/dL   Creat 1.11 (H) 0.60 - 0.88 mg/dL   GFR, Est Non African American 46 (L) > OR = 60 mL/min/1.52m   GFR, Est African American 53 (L) > OR = 60 mL/min/1.754m  BUN/Creatinine Ratio 15 6 - 22 (calc)   Sodium 133 (L) 135 - 146 mmol/L   Potassium 4.5 3.5 - 5.3 mmol/L   Chloride 97 (L) 98 - 110 mmol/L   CO2 28 20 - 32 mmol/L   Calcium 9.5 8.6 - 10.4 mg/dL   Total Protein 6.4 6.1 - 8.1 g/dL   Albumin 4.1 3.6 - 5.1 g/dL   Globulin 2.3 1.9 - 3.7 g/dL (calc)   AG Ratio 1.8 1.0 - 2.5 (calc)   Total Bilirubin 0.5 0.2 - 1.2 mg/dL   Alkaline phosphatase (APISO) 66 37 - 153 U/L   AST 20 10 - 35 U/L   ALT 13 6 - 29 U/L  Microalbumin / creatinine urine ratio  Result Value Ref Range   Creatinine, Urine 21 20 - 275 mg/dL   Microalb, Ur 22.9 mg/dL   Microalb Creat Ratio 1,090 (H) <30 mcg/mg creat   Complete  Blood Count (Most recent): Lab Results  Component Value Date   WBC 5.9 05/16/2017   HGB 12.9 05/16/2017   HCT 40.1 05/16/2017   MCV 93.9 05/16/2017   PLT 331 05/16/2017   Chemistry (most recent): Lab Results  Component Value Date   NA 138  05/27/2019   K 4.5 05/27/2019   CL 102 05/27/2019   CO2 27 05/27/2019   BUN 23 05/27/2019   CREATININE 1.24 (H) 05/27/2019   Diabetic Labs (most recent): Lab Results  Component Value Date   HGBA1C 7.4 (H) 05/27/2019   HGBA1C 7.3 (H) 11/25/2018   HGBA1C 7.7 (H) 08/14/2018   Lipid Panel     Component Value Date/Time   CHOL 164 04/03/2019 0852   TRIG 44 04/03/2019 0852   HDL 92 04/03/2019 0852   CHOLHDL 1.8 04/03/2019 0852   VLDL 8 10/31/2016 0845   LDLCALC 59 04/03/2019 0852     Assessment & Plan:   1. Type 2 diabetes mellitus with stage 3 chronic kidney disease, with long-term current use of insulin   Patient came with generally improving glycemic profile, has random hypoglycemic episodes.   -Her previsit labs showed A1c of 7.4%, remains generally stable.  She has no major hypoglycemia nor hyperglycemia.  She is reporting near target glycemic profile averaging 150-165.      Her recent labs were reviewed with her.     Patient remains at a high risk for more acute and chronic complications of diabetes which include CAD, CVA, CKD, retinopathy, and neuropathy. These are all discussed in detail with the patient. I have re-counseled the patient on diet management and weight loss, by adopting a carbohydrate restricted / protein rich  Diet.  - she  admits there is a room for improvement in her diet and drink choices. -  Suggestion is made for her to avoid simple carbohydrates  from her diet including Cakes, Sweet Desserts / Pastries, Ice Cream, Soda (diet and regular), Sweet Tea, Candies, Chips, Cookies, Sweet Pastries,  Store Bought Juices, Alcohol in Excess of  1-2 drinks a day, Artificial Sweeteners, Coffee Creamer, and "Sugar-free" Products. This will help patient to have stable blood glucose profile and potentially avoid unintended weight gain.  I have approached patient to continue on  intensive monitoring of blood glucose and insulin therapy, and patient agrees.   - She  continued to do fairly well on premixed insulin.  -She does not have hypoglycemia  Episodes, advised to continue  NovoLog 70/30  10 units with breakfast and 10 units with supper when pre-meal glucose is above 90 mg/dL . - She is warned not to inject insulin without proper monitoring of blood glucose. She is advised to continue monitoring her blood glucose 4 times a day-before meals and at bedtime.    -Patient is encouraged to call clinic for blood glucose levels less than 70 or above 300 mg /dl.  -Target numbers for A1c, LDL, HDL, Triglycerides, Waist Circumference were discussed in detail.  2) HTN: she is advised to home monitor blood pressure and report if > 140/90 on 2 separate readings.    She is advised to continue her current blood pressure medications including spironolactone 25 mg daily, losartan 50 mg p.o. daily.    3. Lipids: Her most recent lipid panel showed LDL at 98.  She has not tolerated statins.  She is advised to stay on omega-3 fatty acids.  4)  Weight/Diet: CDE consult in progress, exercise, and carbs information provided.   4) Chronic  Care:  -Patient is on ARB  medications and encouraged to continue to follow up with Ophthalmology, Podiatrist at least yearly or according to recommendations, and advised to stay away from smoking. I have recommended yearly flu vaccine and pneumonia vaccination at least every 5 years; and  sleep for at least 7 hours a day.  - Patient Care Time Today:  25 min, of which >50% was spent in  counseling and the rest reviewing her  current and  previous labs/studies, previous treatments, her blood glucose readings, and medications' doses and developing a plan for long-term care based on the latest recommendations for standards of care.   Emily Jennings participated in the discussions, expressed understanding, and voiced agreement with the above plans.  All questions were answered to her satisfaction. she is encouraged to contact clinic should she have  any questions or concerns prior to her return visit.  Follow up plan: Return in about 4 months (around 09/28/2019) for Bring Meter and Logs- A1c in Office.  Glade Lloyd, MD Phone: [phone]  Fax: [phone]  This note was partially dictated with voice recognition software. Similar sounding words can be transcribed inadequately or may not  be corrected upon review.  05/29/2019, 4:26 PM

## 2019-06-02 ENCOUNTER — Ambulatory Visit (INDEPENDENT_AMBULATORY_CARE_PROVIDER_SITE_OTHER): Payer: PPO

## 2019-06-02 ENCOUNTER — Other Ambulatory Visit: Payer: Self-pay

## 2019-06-02 DIAGNOSIS — Z23 Encounter for immunization: Secondary | ICD-10-CM | POA: Diagnosis not present

## 2019-06-26 ENCOUNTER — Encounter: Payer: PPO | Admitting: Family Medicine

## 2019-07-01 ENCOUNTER — Encounter (INDEPENDENT_AMBULATORY_CARE_PROVIDER_SITE_OTHER): Payer: Self-pay

## 2019-07-01 ENCOUNTER — Encounter: Payer: Self-pay | Admitting: Family Medicine

## 2019-07-01 ENCOUNTER — Other Ambulatory Visit: Payer: Self-pay

## 2019-07-01 ENCOUNTER — Ambulatory Visit (INDEPENDENT_AMBULATORY_CARE_PROVIDER_SITE_OTHER): Payer: PPO | Admitting: Family Medicine

## 2019-07-01 VITALS — BP 160/78 | HR 71 | Resp 15 | Ht 60.0 in | Wt 118.1 lb

## 2019-07-01 DIAGNOSIS — Z794 Long term (current) use of insulin: Secondary | ICD-10-CM

## 2019-07-01 DIAGNOSIS — Z0000 Encounter for general adult medical examination without abnormal findings: Secondary | ICD-10-CM | POA: Diagnosis not present

## 2019-07-01 DIAGNOSIS — I10 Essential (primary) hypertension: Secondary | ICD-10-CM | POA: Diagnosis not present

## 2019-07-01 DIAGNOSIS — E1122 Type 2 diabetes mellitus with diabetic chronic kidney disease: Secondary | ICD-10-CM

## 2019-07-01 DIAGNOSIS — N183 Chronic kidney disease, stage 3 unspecified: Secondary | ICD-10-CM

## 2019-07-01 MED ORDER — LOSARTAN POTASSIUM-HCTZ 50-12.5 MG PO TABS: 1.0000 | ORAL_TABLET | Freq: Every day | ORAL | 3 refills | Status: DC

## 2019-07-01 MED ORDER — BENZONATATE 100 MG PO CAPS: 100.0000 mg | ORAL_CAPSULE | Freq: Two times a day (BID) | ORAL | 1 refills | Status: DC | PRN

## 2019-07-01 MED ORDER — SPIRONOLACTONE 25 MG PO TABS: 25.0000 mg | ORAL_TABLET | Freq: Every day | ORAL | 1 refills | Status: DC

## 2019-07-01 NOTE — Progress Notes (Signed)
Emily Jennings     MRN: [id number]      DOB: 12/29/1933  HPI: Patient is in for annual physical exam. No other health concerns are expressed or addressed at the visit. Recent labs, if available are reviewed. Immunization is reviewed , and  updated if needed.   PE: BP (!) 160/78   Pulse 71   Resp 15   Ht 5' (1.524 m)   Wt 118 lb 1.9 oz (53.6 kg)   SpO2 96%   BMI 23.07 kg/m   Pleasant  female, alert and oriented x 3, in no cardio-pulmonary distress. Afebrile. HEENT No facial trauma or asymetry. Sinuses non tender.  Extra occullar muscles intact.. External ears normal, . Neck: supple, no adenopathy,JVD or thyromegaly.No bruits.  Chest: Clear to ascultation bilaterally.No crackles or wheezes. Non tender to palpation  Breast: No asymetry,no masses or lumps. No tenderness. No nipple discharge or inversion. No axillary or supraclavicular adenopathy  Cardiovascular system; Heart sounds normal,  S1 and  S2 ,no S3.  No murmur, or thrill. Apical beat not displaced Peripheral pulses normal.  Abdomen: Soft, non tender, no organomegaly or masses. No bruits. Bowel sounds normal. No guarding, tenderness or rebound.   Musculoskeletal exam: Full ROM of spine, hips , shoulders and knees. No deformity ,swelling or crepitus noted. No muscle wasting or atrophy.   Neurologic: Cranial nerves 2 to 12 intact. Power, tone ,sensation and reflexes normal throughout. No disturbance in gait. No tremor.  Skin: Intact, fungal infection between 4th and 5th toes bilatrerally Pigmentation normal throughout  Psych; Normal mood and affect. Judgement and concentration normal   Assessment & Plan:  Annual physical exam Annual exam as documented. Counseling done  re healthy lifestyle involving commitment to 150 minutes exercise per week, heart healthy diet, and attaining healthy weight.The importance of adequate sleep also discussed. Regular seat belt use and home safety, is also  discussed. Changes in health habits are decided on by the patient with goals and time frames  set for achieving them. Immunization and cancer screening needs are specifically addressed at this visit.   Essential hypertension Uncontrolled  Change to cozaar/ hctz  Today, follow up in 5 weeks DASH diet and commitment to daily physical activity for a minimum of 30 minutes discussed and encouraged, as a part of hypertension management. The importance of attaining a healthy weight is also discussed.  BP/Weight 07/01/2019 01/23/2019 11/26/2018 10/09/2018 09/03/2018 08/27/2018 19/50/9326  Systolic BP 712 458 099 833 825 053 976  Diastolic BP 78 77 77 72 73 70 65  Wt. (Lbs) 118.12 129 129 127 131 127.04 131  BMI 23.07 24.37 24.37 24.8 25.58 24 24.75       Type 2 diabetes mellitus with stage 3 chronic kidney disease, with long-term current use of insulin ([place]) Emily Jennings is reminded of the importance of commitment to daily physical activity for 30 minutes or more, as able and the need to limit carbohydrate intake to 30 to 60 grams per meal to help with blood sugar control.   The need to take medication as prescribed, test blood sugar as directed, and to call between visits if there is a concern that blood sugar is uncontrolled is also discussed.   Emily Jennings is reminded of the importance of daily foot exam, annual eye examination, and good blood sugar, blood pressure and cholesterol control.  Diabetic Labs Latest Ref Rng & Units 05/27/2019 04/03/2019 11/25/2018 08/14/2018 03/29/2018  HbA1c <5.7 % of total Hgb 7.4(H) - 7.3(H)  7.7(H) -  Microalbumin mg/dL - 22.9 - - -  Micro/Creat Ratio <30 mcg/mg creat - 1,090(H) - - -  Chol <200 mg/dL - 164 - 202(H) 214(H)  HDL > OR = 50 mg/dL - 92 - 106 106  Calc LDL mg/dL (calc) - 59 - 83 98  Triglycerides <150 mg/dL - 44 - 44 33  Creatinine 0.60 - 0.88 mg/dL 1.24(H) 1.11(H) 1.19(H) 1.18(H) -   BP/Weight 07/01/2019 01/23/2019 11/26/2018 10/09/2018 09/03/2018 08/27/2018  61/16/4353  Systolic BP 912 258 346 219 471 252 712  Diastolic BP 78 77 77 72 73 70 65  Wt. (Lbs) 118.12 129 129 127 131 127.04 131  BMI 23.07 24.37 24.37 24.8 25.58 24 24.75   Foot/eye exam completion dates Latest Ref Rng & Units 07/01/2019 04/03/2018  Eye Exam No Retinopathy - -  Foot Form Completion - Done Done   Managed by Endo and controlled

## 2019-07-01 NOTE — Patient Instructions (Addendum)
F/U in 5 to 6 weeks in office with MD , re evaluate blood pressure  New for high blood pressure is hyzaar ( cozaar/ HCTZ 50/12.5 one daily) STOP losartan 50 mg once you staRT THIS  TESSALON PERLES ARE PRESCRIBED  CLOTRIMAZOLE ANTIFUNGAL SPRAY WHICH IS OPVER THE COUNTER IS EXCELLENT FOR THE RASHES BETWEEN YOUR TOES. uSE FOR 7 TO 10 DAYS TWICE DAILY, THEN AS NEEDED  NON FASTING CHEM 7 AND egfr 3 DAYS BEFORE F/U  Thanks for choosing [facility], we consider it a privelige to serve you.

## 2019-07-01 NOTE — Assessment & Plan Note (Addendum)
Uncontrolled  Change to cozaar/ hctz  Today, follow up in 5 weeks DASH diet and commitment to daily physical activity for a minimum of 30 minutes discussed and encouraged, as a part of hypertension management. The importance of attaining a healthy weight is also discussed.  BP/Weight 07/01/2019 01/23/2019 11/26/2018 10/09/2018 09/03/2018 08/27/2018 65/20/7619  Systolic BP 155 027 142 320 094 179 199  Diastolic BP 78 77 77 72 73 70 65  Wt. (Lbs) 118.12 129 129 127 131 127.04 131  BMI 23.07 24.37 24.37 24.8 25.58 24 24.75

## 2019-07-01 NOTE — Assessment & Plan Note (Signed)

## 2019-07-02 ENCOUNTER — Encounter: Payer: Self-pay | Admitting: Family Medicine

## 2019-07-02 NOTE — Assessment & Plan Note (Signed)
Emily Jennings is reminded of the importance of commitment to daily physical activity for 30 minutes or more, as able and the need to limit carbohydrate intake to 30 to 60 grams per meal to help with blood sugar control.   The need to take medication as prescribed, test blood sugar as directed, and to call between visits if there is a concern that blood sugar is uncontrolled is also discussed.   Emily Jennings is reminded of the importance of daily foot exam, annual eye examination, and good blood sugar, blood pressure and cholesterol control.  Diabetic Labs Latest Ref Rng & Units 05/27/2019 04/03/2019 11/25/2018 08/14/2018 03/29/2018  HbA1c <5.7 % of total Hgb 7.4(H) - 7.3(H) 7.7(H) -  Microalbumin mg/dL - 22.9 - - -  Micro/Creat Ratio <30 mcg/mg creat - 1,090(H) - - -  Chol <200 mg/dL - 164 - 202(H) 214(H)  HDL > OR = 50 mg/dL - 92 - 106 106  Calc LDL mg/dL (calc) - 59 - 83 98  Triglycerides <150 mg/dL - 44 - 44 33  Creatinine 0.60 - 0.88 mg/dL 1.24(H) 1.11(H) 1.19(H) 1.18(H) -   BP/Weight 07/01/2019 01/23/2019 11/26/2018 10/09/2018 09/03/2018 08/27/2018 13/24/4010  Systolic BP 272 536 644 034 742 595 638  Diastolic BP 78 77 77 72 73 70 65  Wt. (Lbs) 118.12 129 129 127 131 127.04 131  BMI 23.07 24.37 24.37 24.8 25.58 24 24.75   Foot/eye exam completion dates Latest Ref Rng & Units 07/01/2019 04/03/2018  Eye Exam No Retinopathy - -  Foot Form Completion - Done Done   Managed by Endo and controlled

## 2019-07-10 ENCOUNTER — Encounter: Payer: PPO | Admitting: Family Medicine

## 2019-07-21 ENCOUNTER — Telehealth: Payer: Self-pay

## 2019-07-21 NOTE — Telephone Encounter (Signed)
Please call the patient regarding medication, giving her a feeling of of balance.

## 2019-07-21 NOTE — Telephone Encounter (Signed)
Can she comrn for oV tomorrow to reval blood pressure , was very high at last visit, and bring her meds, I hopefully can add her on, or Jarrett Soho may see her this week when/ if available , please arrange, needs in office visit

## 2019-07-21 NOTE — Telephone Encounter (Signed)
Spoke with patient regarding new med for bp she was started on Engineer, technical sales) and she stated that it is making her off balanced and she is stumbling a lot. Would like for medication to be changed. Please advise.

## 2019-07-23 ENCOUNTER — Encounter: Payer: Self-pay | Admitting: Family Medicine

## 2019-07-23 ENCOUNTER — Other Ambulatory Visit: Payer: Self-pay

## 2019-07-23 ENCOUNTER — Ambulatory Visit (INDEPENDENT_AMBULATORY_CARE_PROVIDER_SITE_OTHER): Payer: PPO | Admitting: Family Medicine

## 2019-07-23 VITALS — BP 140/60 | HR 67 | Temp 97.8°F | Resp 15 | Ht 61.0 in | Wt 122.0 lb

## 2019-07-23 DIAGNOSIS — I10 Essential (primary) hypertension: Secondary | ICD-10-CM

## 2019-07-23 DIAGNOSIS — Z9181 History of falling: Secondary | ICD-10-CM

## 2019-07-23 MED ORDER — LOSARTAN POTASSIUM 50 MG PO TABS: 50.0000 mg | ORAL_TABLET | Freq: Every day | ORAL | 0 refills | Status: DC

## 2019-07-23 NOTE — Progress Notes (Signed)
  Subjective:     Patient ID: Emily Jennings, female   DOB: 02/08/1934, 85 y.o.   MRN: [id number]  Emily Jennings presents for Off balance  Emily Jennings is an 85-year-old female patient of Dr. Simpson's.  Who was seen last October 13 at that time she was changed to losartan hydrochlorothiazide combo pill.  For blood pressure that was not well controlled.  She reports that 3 days or so after starting the medication she started feeling like she was stumbling and wobbly and not well on her feet.  She denies having any falls.  She denies having any dizziness or lightheadedness or spinning-like feelings.  She reports that she went to try to get out of the bathtub and that is when she knew that she needed to stop the medicine because she felt like she might fall.  She reports that she did not have this problem when she was just on the losartan regular.  She reports she is been taking her Norvasc and spironolactone as directed.  Has been avoiding salt.  Does not actively exercise though.  Since stopping the combo medication she has felt much better and has not had any of the feelings that she was having.  Today patient denies signs and symptoms of COVID 19 infection including fever, chills, cough, shortness of breath, and headache.  Past Medical, Surgical, Social History, Allergies, and Medications have been Reviewed.   Past Medical History:  Diagnosis Date  . Allergy   . Anxiety   . Arthritis   . Cancer (HCC) 2009   breast, carcinoma in situ left  . Carotid stenosis    11/16/2005  mild plaque formation and stenosis proximal right ECA  . Cataract   . Complication of anesthesia   . Coronary artery disease    cardiac catheterization on 03/20/2006  LAD mid 40% stenosis, left circumflex mild 40% stenosis, RCA mid-vessel 40% to 50% lesion   EF 60%  . Diabetes mellitus   . GERD (gastroesophageal reflux disease)   . Hernia, inguinal    left  . Hyperglycemia   . Hypertension   . Low blood potassium   .  Non-insulin dependent type 2 diabetes mellitus (HCC)   . Osteoporosis   . Shortness of breath    2D Echocardiogram 01/26/2009   EF of greater than 55%, mild MR, mild TR, normal ventricular function  . Ventricular tachycardia, non-sustained (HCC)    developed during stress test 02/08/2006, spontaneously aborted, mild reversible apical defect   Past Surgical History:  Procedure Laterality Date  . BREAST LUMPECTOMY Left 2009   Left breast 2009  . CATARACT EXTRACTION W/PHACO Left 10/28/2014   Procedure: PHACO EMULSION CATARACT EXTRACTION WITH INTRAOCULAR LENS IMPLANT LEFT EYE (IOC);  Surgeon: Roy Whitaker, MD;  Location: [facility] OR;  Service: Ophthalmology;  Laterality: Left;  . COLONOSCOPY    . cyst removed from left foot     Social History   Socioeconomic History  . Marital status: Married    Spouse name: Not on file  . Number of children: 2  . Years of education: na  . Highest education level: Some college, no degree  Occupational History  . Occupation: retired    Employer: retired  Social Needs  . Financial resource strain: Not hard at all  . Food insecurity    Worry: Never true    Inability: Never true  . Transportation needs    Medical: No    Non-medical: No  Tobacco Use  .   Smoking status: Never Smoker  . Smokeless tobacco: Never Used  Substance and Sexual Activity  . Alcohol use: No  . Drug use: No  . Sexual activity: Not Currently    Birth control/protection: Post-menopausal  Lifestyle  . Physical activity    Days per week: 2 days    Minutes per session: 50 min  . Stress: Not at all  Relationships  . Social connections    Talks on phone: More than three times a week    Gets together: Three times a week    Attends religious service: More than 4 times per year    Active member of club or organization: Yes    Attends meetings of clubs or organizations: More than 4 times per year    Relationship status: Married  . Intimate partner violence    Fear of current or ex  partner: No    Emotionally abused: No    Physically abused: No    Forced sexual activity: No  Other Topics Concern  . Not on file  Social History Narrative   Lives with her husband and son and attends yoga at the [employer] 2 days a week and speaks to her grandson nightly on the phone    Outpatient Encounter Medications as of 07/23/2019  Medication Sig  . acetaminophen (TYLENOL) 500 MG tablet Take one tablet two times daily for 3 days, then as needed  . alendronate (FOSAMAX) 70 MG tablet TAKE 1 TABLET BY MOUTH EVERY 7 DAYS. TAKE WITH A FULL GLASS OF WATER ON AN EMPTY STOMACH  . amLODipine (NORVASC) 10 MG tablet TAKE 1 TABLET BY MOUTH EVERY DAY  . aspirin EC 81 MG tablet Take 81 mg by mouth daily.  . B-D ULTRAFINE III SHORT PEN 31G X 8 MM MISC USE AS DIRECTED  . BD INSULIN SYRINGE U/F 31G X 5/16" 0.5 ML MISC USE AS DIRECTED TWICE DAILY  . blood glucose meter kit and supplies KIT One touch Ultra. Use up to two times daily as directed. (FOR ICD E11.65)  . cholecalciferol (VITAMIN D3) 25 MCG (1000 UT) tablet Take 1,000 Units by mouth daily.  . insulin aspart protamine- aspart (NOVOLOG MIX 70/30) (70-30) 100 UNIT/ML injection INJECT 10 UNITS WITH BREAKFAST AND 10 UNITS WITH SUPPER WHEN BLOOD GLUCOSE IS ABOVE 90.  . KLOR-CON M20 20 MEQ tablet TAKE 1 TABLET BY MOUTH TWICE A DAY  . losartan-hydrochlorothiazide (HYZAAR) 50-12.5 MG tablet Take 1 tablet by mouth daily.  . metoprolol tartrate (LOPRESSOR) 50 MG tablet Take 1 tablet (50 mg total) by mouth 2 (two) times daily.  . omega-3 acid ethyl esters (LOVAZA) 1 g capsule Take by mouth 2 (two) times daily.  . ONETOUCH DELICA LANCETS 33G MISC USE TO OBTAIN A BLOOD SPECIMEN 2 TIMES A DAY  . ONETOUCH ULTRA test strip USE TO TEST BLOOD SUGAR 4 TIMES A DAY (E11.65)  . rosuvastatin (CRESTOR) 5 MG tablet Take 1 tablet (5 mg total) by mouth at bedtime.  . spironolactone (ALDACTONE) 25 MG tablet Take 1 tablet (25 mg total) by mouth daily.  . [DISCONTINUED]  benzonatate (TESSALON) 100 MG capsule Take 1 capsule (100 mg total) by mouth 2 (two) times daily as needed for cough.  . [DISCONTINUED] KLOR-CON M20 20 MEQ tablet TAKE 1 TABLET BY MOUTH TWICE A DAY   No facility-administered encounter medications on file as of 07/23/2019.    Allergies  Allergen Reactions  . Benadryl [Diphenhydramine Hcl] Hypertension  . Citalopram   . Metformin And Related Diarrhea  .   Tramadol Other (See Comments)    Felt light headed and dizzy  . Tylenol [Acetaminophen] Other (See Comments)    Patient stated tylenol makes her "feel high"    Review of Systems  HENT: Negative.   Eyes: Negative.   Respiratory: Negative.   Cardiovascular: Negative.   Gastrointestinal: Negative.   Endocrine: Negative.   Genitourinary: Negative.   Musculoskeletal: Negative.   Skin: Negative.   Allergic/Immunologic: Negative.   Neurological: Negative.        See HPI   Hematological: Negative.   Psychiatric/Behavioral: Negative.   All other systems reviewed and are negative.      Objective:     BP 140/60   Pulse 67   Temp 97.8 F (36.6 C) (Temporal)   Resp 15   Ht 5' 1" (1.549 m)   Wt 122 lb (55.3 kg)   SpO2 94%   BMI 23.05 kg/m   Physical Exam Vitals signs and nursing note reviewed.  Constitutional:      Appearance: Normal appearance. She is well-developed, well-groomed and normal weight.  HENT:     Head: Normocephalic and atraumatic.     Right Ear: External ear normal.     Left Ear: External ear normal.     Nose: Nose normal.     Mouth/Throat:     Mouth: Mucous membranes are moist.     Pharynx: Oropharynx is clear.  Eyes:     General:        Right eye: No discharge.        Left eye: No discharge.     Conjunctiva/sclera: Conjunctivae normal.  Neck:     Musculoskeletal: Normal range of motion and neck supple.  Cardiovascular:     Rate and Rhythm: Normal rate and regular rhythm.     Pulses: Normal pulses.     Heart sounds: Normal heart sounds.   Pulmonary:     Effort: Pulmonary effort is normal.     Breath sounds: Normal breath sounds.  Musculoskeletal: Normal range of motion.  Skin:    General: Skin is warm.  Neurological:     General: No focal deficit present.     Mental Status: She is alert and oriented to person, place, and time.  Psychiatric:        Attention and Perception: Attention normal.        Mood and Affect: Mood normal.        Speech: Speech normal.        Behavior: Behavior normal. Behavior is cooperative.        Thought Content: Thought content normal.        Cognition and Memory: Cognition normal.        Judgment: Judgment normal.        Assessment and Plan       1. Essential hypertension Reported that she has not been taking the hydrochlorothiazide losartan combo. She said that when she started taking the combo medication back in October after seeing Dr. Moshe Cipro she had  "stumble feelings" not dizziness or lightheadedness but she felt like she was at a risk for falling and felt wobbly so she stopped taking the medication.  Today I have recommended to fully stop the losartan combo with hydrochlorothiazide since she had side effects. And that she start taking the losartan 50 mg regularly daily continue her amlodipine 10 mg daily and take her spironolactone daily as well and earlier in the day to prevent her from having problems with going to the bathroom as  she reports that it causes her to stay up late at night but she has not been taking it during the day because it prevents her from being able to go out and do what she wants to do.  She has follow-up with Dr. Simpson in 2 weeks at that time Dr. Simpson can decide whether or not she needs to adjust or change her medications again.  Blood pressure appears controlled today.  Even without taking the combo medication for the last week or two.   2. At risk for falls Given age and medications such as spironolactone patient is at risk for fall.  Encouraged her to  make sure that she moves slowly and safely and if she has any other ongoing issues with feeling wobbly or stumbling a lot to let us know.  She reports she is not had any of the symptoms since stopping the combo pill.  Follow Up: 08/06/2019  Hannah M. Mills, DNP, AGNP-BC [facility] [address], [address] [place], [place] [address] Office Hours: Mon-Thurs 8 am-5 pm; Fri 8 am-12 pm Office Phone:  [phone]  Office Fax: [phone]        

## 2019-07-23 NOTE — Addendum Note (Signed)
Addended by: Perlie Mayo on: 07/23/2019 12:39 PM   Modules accepted: Orders

## 2019-07-23 NOTE — Telephone Encounter (Signed)
Reached out and lvm for the pt to call the office to schedule

## 2019-07-23 NOTE — Patient Instructions (Addendum)
    I appreciate the opportunity to provide you with the care for your health and wellness. Today we discussed: blood pressure medication   Follow up: 08/06/2019 with Dr Moshe Cipro she will check BP and make sure it is okay  No labs or referrals today  STOP the losartan combo with HCTZ  RESTART losartan 50 mg daily  CONTINUE Amlodipine 10 mg daily  START taking Spironolactone in early afternoon to prevent being up all night using restroom.  Please continue to practice social distancing to keep you, your family, and our community safe.  If you must go out, please wear a mask and practice good handwashing.  It was a pleasure to see you and I look forward to continuing to work together on your health and well-being. Please do not hesitate to call the office if you need care or have questions about your care.  Have a wonderful day and week. With Gratitude, Cherly Beach, DNP, AGNP-BC

## 2019-07-28 ENCOUNTER — Telehealth: Payer: Self-pay | Admitting: "Endocrinology

## 2019-07-28 NOTE — Telephone Encounter (Signed)
Pt left a VM inquiring if she could switch from novolog to humalog due to insurance.

## 2019-07-28 NOTE — Telephone Encounter (Signed)
Pt had questions about could we change med in Jan. I advised her to call back the end of Dec to have this sent in.

## 2019-08-01 DIAGNOSIS — I10 Essential (primary) hypertension: Secondary | ICD-10-CM | POA: Diagnosis not present

## 2019-08-01 LAB — BASIC METABOLIC PANEL WITH GFR
BUN/Creatinine Ratio: 22 (calc) (ref 6–22)
BUN: 27 mg/dL — ABNORMAL HIGH (ref 7–25)
CO2: 31 mmol/L (ref 20–32)
Calcium: 9.8 mg/dL (ref 8.6–10.4)
Chloride: 101 mmol/L (ref 98–110)
Creat: 1.24 mg/dL — ABNORMAL HIGH (ref 0.60–0.88)
GFR, Est African American: 47 mL/min/{1.73_m2} — ABNORMAL LOW (ref >=60)
GFR, Est Non African American: 40 mL/min/{1.73_m2} — ABNORMAL LOW (ref >=60)
Glucose, Bld: 217 mg/dL — ABNORMAL HIGH (ref 65–99)
Potassium: 4.5 mmol/L (ref 3.5–5.3)
Sodium: 138 mmol/L (ref 135–146)

## 2019-08-06 ENCOUNTER — Encounter: Payer: Self-pay | Admitting: Family Medicine

## 2019-08-06 ENCOUNTER — Other Ambulatory Visit: Payer: Self-pay

## 2019-08-06 ENCOUNTER — Ambulatory Visit (INDEPENDENT_AMBULATORY_CARE_PROVIDER_SITE_OTHER): Payer: PPO | Admitting: Family Medicine

## 2019-08-06 VITALS — BP 138/80 | HR 70 | Temp 96.0°F | Resp 15 | Ht 61.0 in | Wt 124.0 lb

## 2019-08-06 DIAGNOSIS — R197 Diarrhea, unspecified: Secondary | ICD-10-CM | POA: Diagnosis not present

## 2019-08-06 DIAGNOSIS — Z794 Long term (current) use of insulin: Secondary | ICD-10-CM

## 2019-08-06 DIAGNOSIS — E782 Mixed hyperlipidemia: Secondary | ICD-10-CM

## 2019-08-06 DIAGNOSIS — N183 Chronic kidney disease, stage 3 unspecified: Secondary | ICD-10-CM | POA: Diagnosis not present

## 2019-08-06 DIAGNOSIS — E1122 Type 2 diabetes mellitus with diabetic chronic kidney disease: Secondary | ICD-10-CM | POA: Diagnosis not present

## 2019-08-06 DIAGNOSIS — I10 Essential (primary) hypertension: Secondary | ICD-10-CM

## 2019-08-06 DIAGNOSIS — R195 Other fecal abnormalities: Secondary | ICD-10-CM | POA: Diagnosis not present

## 2019-08-06 NOTE — Patient Instructions (Addendum)
F/U with MD in office fo re evaluation end March, call if you need me sooner  You need to call and make an appointment with Dr Fuller Plan about your bowel movements. In the meantime use OTC immodium one avery 2 to  3 days , if needed to reduce stool frequency. Do NOT over use , you will be constipated  Letter from Dr Lanelle Bal is given to you to call his office , I have entered the referral  Continue current BP medication

## 2019-08-06 NOTE — Assessment & Plan Note (Signed)
Controlled, no change in medication DASH diet and commitment to daily physical activity for a minimum of 30 minutes discussed and encouraged, as a part of hypertension management. The importance of attaining a healthy weight is also discussed.  BP/Weight 08/06/2019 07/23/2019 07/01/2019 01/23/2019 11/26/2018 10/09/2018 07/15/2535  Systolic BP 644 034 742 595 638 756 433  Diastolic BP 80 60 78 77 77 72 73  Wt. (Lbs) 124 122 118.12 129 129 127 131  BMI 23.43 23.05 23.07 24.37 24.37 24.8 25.58

## 2019-08-06 NOTE — Assessment & Plan Note (Addendum)
FREQUENT  LOOS STOOL FOR YEARS, BUT WORSENING IN PAST 6 WEEKS, SOMETIMES STOOLBALLS, STRINGY HAD SEVERAL TUBULAR ADENOMAS IN 2019. START IMMODIUM AS NEEDED, LOWEST EFFECTIVE DOSE AND GI REEVAL

## 2019-08-09 ENCOUNTER — Other Ambulatory Visit: Payer: Self-pay | Admitting: "Endocrinology

## 2019-08-23 ENCOUNTER — Encounter: Payer: Self-pay | Admitting: Family Medicine

## 2019-08-23 NOTE — Progress Notes (Signed)
Emily Jennings     MRN: [id number]      DOB: 25-May-1934   HPI Emily Jennings is here for follow up and re-evaluation of chronic medical conditions, in particular , uncontrolled hypertension.,she denies adverse s/e to current medications, she denies light headedness  C/o fecal urgency and increased frequency of soft stool. Record review reveals that her GI re eval is overdue and she is reminded of importance of following through Denies polyuria, polydipsia, blurred vision , or hypoglycemic episodes.  ROS Denies recent fever or chills. Denies sinus pressure, nasal congestion, ear pain or sore throat. Denies chest congestion, productive cough or wheezing. Denies chest pains, palpitations and leg swelling  Denies dysuria, frequency, hesitancy or incontinence. Denies joint pain, swelling and limitation in mobility. Denies headaches, seizures, numbness, or tingling. Denies depression, anxiety or insomnia. Denies skin break down or rash.   PE  BP 138/80   Pulse 70   Temp (!) 96 F (35.6 C) (Temporal)   Resp 15   Ht 5\' 1"  (1.829 m)   Wt 124 lb (56.2 kg)   BMI 23.43 kg/m   Patient alert and oriented and in no cardiopulmonary distress.  HEENT: No facial asymmetry, EOMI,     Neck supple .  Chest: Clear to auscultation bilaterally.  CVS: S1, S2 no murmurs, no S3.Regular rate.  ABD: Soft non tender.   Ext: No edema  MS: Adequate ROM spine, shoulders, hips and knees.  Skin: Intact, no ulcerations or rash noted.  Psych: Good eye contact, normal affect. Memory intact not anxious or depressed appearing.  CNS: CN 2-12 intact, power,  normal throughout.no focal deficits noted.   Assessment & Plan  Essential hypertension Controlled, no change in medication DASH diet and commitment to daily physical activity for a minimum of 30 minutes discussed and encouraged, as a part of hypertension management. The importance of attaining a healthy weight is also discussed.  BP/Weight 08/06/2019  07/23/2019 07/01/2019 01/23/2019 11/26/2018 10/09/2018 93/71/6967  Systolic BP 893 810 175 102 585 277 824  Diastolic BP 80 60 78 77 77 72 73  Wt. (Lbs) 124 122 118.12 129 129 127 131  BMI 23.43 23.05 23.07 24.37 24.37 24.8 25.58       Frequent loose stools FREQUENT  LOOS STOOL FOR YEARS, BUT WORSENING IN PAST 6 WEEKS, SOMETIMES STOOLBALLS, STRINGY HAD SEVERAL TUBULAR ADENOMAS IN 2019. START IMMODIUM AS NEEDED, LOWEST EFFECTIVE DOSE AND GI REEVAL  Type 2 diabetes mellitus with stage 3 chronic kidney disease, with long-term current use of insulin ([place]) Emily Jennings is reminded of the importance of commitment to daily physical activity for 30 minutes or more, as able and the need to limit carbohydrate intake to 30 to 60 grams per meal to help with blood sugar control.   The need to take medication as prescribed, test blood sugar as directed, and to call between visits if there is a concern that blood sugar is uncontrolled is also discussed.   Emily Jennings is reminded of the importance of daily foot exam, annual eye examination, and good blood sugar, blood pressure and cholesterol control.  Diabetic Labs Latest Ref Rng & Units 08/01/2019 05/27/2019 04/03/2019 11/25/2018 08/14/2018  HbA1c <5.7 % of total Hgb - 7.4(H) - 7.3(H) 7.7(H)  Microalbumin mg/dL - - 22.9 - -  Micro/Creat Ratio <30 mcg/mg creat - - 1,090(H) - -  Chol <200 mg/dL - - 164 - 202(H)  HDL > OR = 50 mg/dL - - 92 - 106  Calc  LDL mg/dL (calc) - - 59 - 83  Triglycerides <150 mg/dL - - 44 - 44  Creatinine 0.60 - 0.88 mg/dL 1.24(H) 1.24(H) 1.11(H) 1.19(H) 1.18(H)   BP/Weight 08/06/2019 07/23/2019 07/01/2019 01/23/2019 11/26/2018 10/09/2018 96/22/2979  Systolic BP 892 119 417 408 144 818 563  Diastolic BP 80 60 78 77 77 72 73  Wt. (Lbs) 124 122 118.12 129 129 127 131  BMI 23.43 23.05 23.07 24.37 24.37 24.8 25.58   Foot/eye exam completion dates Latest Ref Rng & Units 07/01/2019 04/03/2018  Eye Exam No Retinopathy - -  Foot Form Completion -  Done Done   Managed by Endo and doing very well     Mixed hyperlipidemia Hyperlipidemia:Low fat diet discussed and encouraged.   Lipid Panel  Lab Results  Component Value Date   CHOL 164 04/03/2019   HDL 92 04/03/2019   LDLCALC 59 04/03/2019   TRIG 44 04/03/2019   CHOLHDL 1.8 04/03/2019   Controlled, no change in medication

## 2019-08-23 NOTE — Assessment & Plan Note (Signed)
Emily Jennings is reminded of the importance of commitment to daily physical activity for 30 minutes or more, as able and the need to limit carbohydrate intake to 30 to 60 grams per meal to help with blood sugar control.   The need to take medication as prescribed, test blood sugar as directed, and to call between visits if there is a concern that blood sugar is uncontrolled is also discussed.   Emily Jennings is reminded of the importance of daily foot exam, annual eye examination, and good blood sugar, blood pressure and cholesterol control.  Diabetic Labs Latest Ref Rng & Units 08/01/2019 05/27/2019 04/03/2019 11/25/2018 08/14/2018  HbA1c <5.7 % of total Hgb - 7.4(H) - 7.3(H) 7.7(H)  Microalbumin mg/dL - - 22.9 - -  Micro/Creat Ratio <30 mcg/mg creat - - 1,090(H) - -  Chol <200 mg/dL - - 164 - 202(H)  HDL > OR = 50 mg/dL - - 92 - 106  Calc LDL mg/dL (calc) - - 59 - 83  Triglycerides <150 mg/dL - - 44 - 44  Creatinine 0.60 - 0.88 mg/dL 1.24(H) 1.24(H) 1.11(H) 1.19(H) 1.18(H)   BP/Weight 08/06/2019 07/23/2019 07/01/2019 01/23/2019 11/26/2018 10/09/2018 84/16/6063  Systolic BP 016 010 932 355 732 202 542  Diastolic BP 80 60 78 77 77 72 73  Wt. (Lbs) 124 122 118.12 129 129 127 131  BMI 23.43 23.05 23.07 24.37 24.37 24.8 25.58   Foot/eye exam completion dates Latest Ref Rng & Units 07/01/2019 04/03/2018  Eye Exam No Retinopathy - -  Foot Form Completion - Done Done   Managed by Endo and doing very well

## 2019-08-23 NOTE — Assessment & Plan Note (Signed)
Hyperlipidemia:Low fat diet discussed and encouraged.   Lipid Panel  Lab Results  Component Value Date   CHOL 164 04/03/2019   HDL 92 04/03/2019   LDLCALC 59 04/03/2019   TRIG 44 04/03/2019   CHOLHDL 1.8 04/03/2019   Controlled, no change in medication

## 2019-09-01 ENCOUNTER — Other Ambulatory Visit: Payer: Self-pay

## 2019-09-01 ENCOUNTER — Telehealth: Payer: Self-pay | Admitting: *Deleted

## 2019-09-01 DIAGNOSIS — I10 Essential (primary) hypertension: Secondary | ICD-10-CM

## 2019-09-01 MED ORDER — LOSARTAN POTASSIUM 50 MG PO TABS: 50.0000 mg | ORAL_TABLET | Freq: Every day | ORAL | 0 refills | Status: DC

## 2019-09-01 NOTE — Telephone Encounter (Signed)
Changed the med back to losartan potassium and pharmacy is aware to d/c the losartan hctz

## 2019-09-01 NOTE — Telephone Encounter (Signed)
Pt just picked up her medication they gave her the losartin hctz and she cannot take that she takes the losartin with potassium in it she wanted to know if we could cancel the one with hctz in it and she needs the one with potassium sent [facility] [place]

## 2019-09-03 ENCOUNTER — Other Ambulatory Visit: Payer: Self-pay | Admitting: Family Medicine

## 2019-09-20 ENCOUNTER — Other Ambulatory Visit: Payer: Self-pay | Admitting: Family Medicine

## 2019-09-29 ENCOUNTER — Other Ambulatory Visit: Payer: Self-pay

## 2019-09-29 ENCOUNTER — Telehealth: Payer: Self-pay | Admitting: "Endocrinology

## 2019-09-29 MED ORDER — INSULIN LISPRO PROT & LISPRO (75-25 MIX) 100 UNIT/ML ~~LOC~~ SUSP: 10.0000 [IU] | Freq: Two times a day (BID) | SUBCUTANEOUS | 0 refills | Status: DC

## 2019-09-29 NOTE — Telephone Encounter (Signed)
Pt is requesting to be changed from novolog to humalog due to insurance. Patient's pharmacy is [facility] in [place]

## 2019-09-29 NOTE — Telephone Encounter (Signed)
Novolog changed to humalog 75/25 10u b.i.d per Dr. Dorris Fetch

## 2019-09-30 ENCOUNTER — Other Ambulatory Visit (HOSPITAL_COMMUNITY): Payer: Self-pay | Admitting: Family Medicine

## 2019-09-30 DIAGNOSIS — Z1231 Encounter for screening mammogram for malignant neoplasm of breast: Secondary | ICD-10-CM

## 2019-10-01 ENCOUNTER — Ambulatory Visit: Payer: PPO | Admitting: "Endocrinology

## 2019-10-16 ENCOUNTER — Ambulatory Visit (INDEPENDENT_AMBULATORY_CARE_PROVIDER_SITE_OTHER): Payer: Medicare Other | Admitting: "Endocrinology

## 2019-10-16 ENCOUNTER — Other Ambulatory Visit: Payer: Self-pay

## 2019-10-16 ENCOUNTER — Encounter: Payer: Self-pay | Admitting: "Endocrinology

## 2019-10-16 VITALS — BP 153/70 | HR 62 | Ht 61.0 in | Wt 123.4 lb

## 2019-10-16 DIAGNOSIS — Z794 Long term (current) use of insulin: Secondary | ICD-10-CM

## 2019-10-16 DIAGNOSIS — E782 Mixed hyperlipidemia: Secondary | ICD-10-CM

## 2019-10-16 DIAGNOSIS — N1831 Chronic kidney disease, stage 3a: Secondary | ICD-10-CM | POA: Diagnosis not present

## 2019-10-16 DIAGNOSIS — I10 Essential (primary) hypertension: Secondary | ICD-10-CM | POA: Diagnosis not present

## 2019-10-16 DIAGNOSIS — E1121 Type 2 diabetes mellitus with diabetic nephropathy: Secondary | ICD-10-CM | POA: Diagnosis not present

## 2019-10-16 LAB — POCT GLYCOSYLATED HEMOGLOBIN (HGB A1C): Hemoglobin A1C: 7.7 % — AB (ref 4.0–5.6)

## 2019-10-16 MED ORDER — GLIPIZIDE ER 2.5 MG PO TB24: 2.5000 mg | ORAL_TABLET | Freq: Every day | ORAL | 3 refills | Status: DC

## 2019-10-16 NOTE — Patient Instructions (Signed)
COVID-19 Vaccine Information can be found at: [email or web address] For questions related to vaccine distribution or appointments, please email [email or web address] or call [phone]    

## 2019-10-16 NOTE — Progress Notes (Signed)
10/16/2019  Endocrinology follow-up note   Subjective:    Patient ID: Emily Jennings, female    DOB: [birth year],    Past Medical History:  Diagnosis Date  . Allergy   . Anxiety   . Arthritis   . Cancer [facility]) 2009   breast, carcinoma in situ left  . Carotid stenosis    11/16/2005  mild plaque formation and stenosis proximal right ECA  . Cataract   . Complication of anesthesia   . Coronary artery disease    cardiac catheterization on 03/20/2006  LAD mid 40% stenosis, left circumflex mild 40% stenosis, RCA mid-vessel 40% to 50% lesion   EF 60%  . Diabetes mellitus   . GERD (gastroesophageal reflux disease)   . Hernia, inguinal    left  . Hyperglycemia   . Hypertension   . Low blood potassium   . Non-insulin dependent type 2 diabetes mellitus ([place])   . Osteoporosis   . Shortness of breath    2D Echocardiogram 01/26/2009   EF of greater than 55%, mild MR, mild TR, normal ventricular function  . Ventricular tachycardia, non-sustained (HCC)    developed during stress test 02/08/2006, spontaneously aborted, mild reversible apical defect   Past Surgical History:  Procedure Laterality Date  . BREAST LUMPECTOMY Left 2009   Left breast 2009  . CATARACT EXTRACTION W/PHACO Left 10/28/2014   Procedure: PHACO EMULSION CATARACT EXTRACTION WITH INTRAOCULAR LENS IMPLANT LEFT EYE (IOC);  Surgeon: Marylynn Pearson, MD;  Location: [place];  Service: Ophthalmology;  Laterality: Left;  . COLONOSCOPY    . cyst removed from left foot     Social History   Socioeconomic History  . Marital status: Married    Spouse name: Not on file  . Number of children: 2  . Years of education: na  . Highest education level: Some college, no degree  Occupational History  . Occupation: retired    Fish farm manager: retired  Tobacco Use  . Smoking status: Never Smoker  . Smokeless tobacco: Never Used  Substance and Sexual Activity  . Alcohol use: No  . Drug use: No  . Sexual activity: Not Currently    Birth  control/protection: Post-menopausal  Other Topics Concern  . Not on file  Social History Narrative   Lives with her husband and son and attends yoga at the [facility] [employer] 2 days a week and speaks to her grandson nightly on the phone   Social Determinants of Health   Financial Resource Strain:   . Difficulty of Paying Living Expenses: Not on file  Food Insecurity:   . Worried About Charity fundraiser in the Last Year: Not on file  . Ran Out of Food in the Last Year: Not on file  Transportation Needs:   . Lack of Transportation (Medical): Not on file  . Lack of Transportation (Non-Medical): Not on file  Physical Activity:   . Days of Exercise per Week: Not on file  . Minutes of Exercise per Session: Not on file  Stress:   . Feeling of Stress : Not on file  Social Connections:   . Frequency of Communication with Friends and Family: Not on file  . Frequency of Social Gatherings with Friends and Family: Not on file  . Attends Religious Services: Not on file  . Active Member of Clubs or Organizations: Not on file  . Attends Archivist Meetings: Not on file  . Marital Status: Not on file   Outpatient Encounter Medications as of 10/16/2019  Medication Sig  . acetaminophen (TYLENOL) 500 MG tablet Take one tablet two times daily for 3 days, then as needed  . alendronate (FOSAMAX) 70 MG tablet TAKE 1 TABLET BY MOUTH EVERY 7 DAYS. TAKE WITH A FULL GLASS OF WATER ON AN EMPTY STOMACH  . amLODipine (NORVASC) 10 MG tablet TAKE 1 TABLET BY MOUTH EVERY DAY  . aspirin EC 81 MG tablet Take 81 mg by mouth daily.  . B-D ULTRAFINE III SHORT PEN 31G X 8 MM MISC USE AS DIRECTED  . BD INSULIN SYRINGE U/F 31G X 5/16" 0.5 ML MISC USE AS DIRECTED TWICE DAILY  . blood glucose meter kit and supplies KIT One touch Ultra. Use up to two times daily as directed. (FOR ICD E11.65)  . cholecalciferol (VITAMIN D3) 25 MCG (1000 UT) tablet Take 1,000 Units by mouth daily.  . glipiZIDE (GLUCOTROL XL) 2.5 MG 24 hr  tablet Take 1 tablet (2.5 mg total) by mouth daily with breakfast.  . insulin lispro protamine-lispro (HUMALOG 75/25 MIX) (75-25) 100 UNIT/ML SUSP injection Inject 10 Units into the skin 2 (two) times daily.  . KLOR-CON M20 20 MEQ tablet TAKE 1 TABLET BY MOUTH TWICE A DAY  . losartan (COZAAR) 50 MG tablet Take 1 tablet (50 mg total) by mouth daily.  . metoprolol tartrate (LOPRESSOR) 50 MG tablet TAKE 1 TABLET BY MOUTH TWICE A DAY  . omega-3 acid ethyl esters (LOVAZA) 1 g capsule Take by mouth 2 (two) times daily.  . ONETOUCH DELICA LANCETS 33G MISC USE TO OBTAIN A BLOOD SPECIMEN 2 TIMES A DAY  . ONETOUCH ULTRA test strip USE TO TEST BLOOD SUGAR 4 TIMES A DAY (E11.65)  . rosuvastatin (CRESTOR) 5 MG tablet Take 1 tablet (5 mg total) by mouth at bedtime.  . spironolactone (ALDACTONE) 25 MG tablet Take 1 tablet (25 mg total) by mouth daily.   No facility-administered encounter medications on file as of 10/16/2019.   ALLERGIES: Allergies  Allergen Reactions  . Benadryl [Diphenhydramine Hcl] Hypertension  . Citalopram   . Metformin And Related Diarrhea  . Tramadol Other (See Comments)    Felt light headed and dizzy  . Tylenol [Acetaminophen] Other (See Comments)    Patient stated tylenol makes her "feel high"   VACCINATION STATUS: Immunization History  Administered Date(s) Administered  . Fluad Quad(high Dose 65+) 06/02/2019  . Influenza Split 07/17/2011  . Influenza Whole 06/18/2007, 07/29/2009, 05/16/2013  . Influenza, High Dose Seasonal PF 08/13/2018  . Influenza,inj,Quad PF,6+ Mos 05/19/2014, 08/22/2016, 05/17/2017  . Pneumococcal Conjugate-13 03/24/2014  . Pneumococcal Polysaccharide-23 05/20/2008  . Td 06/18/2002  . Tdap 07/17/2011    Diabetes She presents for her follow-up diabetic visit. She has type 2 diabetes mellitus. Onset time: She was diagnosed at approximate age of 77 years. Her disease course has been fluctuating. There are no hypoglycemic associated symptoms.  Pertinent negatives for hypoglycemia include no confusion, headaches, pallor or seizures. There are no diabetic associated symptoms. Pertinent negatives for diabetes include no chest pain, no fatigue, no polydipsia, no polyphagia and no polyuria. There are no hypoglycemic complications. Symptoms are stable. Diabetic complications include nephropathy. Risk factors for coronary artery disease include diabetes mellitus, dyslipidemia, hypertension and sedentary lifestyle. Current diabetic treatment includes insulin injections. She is compliant with treatment some of the time. Her weight is fluctuating minimally. She is following a generally unhealthy diet. When asked about meal planning, she reported none. She has had a previous visit with a dietitian. She never participates in exercise. Her home   blood glucose trend is fluctuating minimally. Her breakfast blood glucose range is generally 140-180 mg/dl. Her lunch blood glucose range is generally 140-180 mg/dl. Her dinner blood glucose range is generally 140-180 mg/dl. Her bedtime blood glucose range is generally 140-180 mg/dl. Her overall blood glucose range is 140-180 mg/dl. An ACE inhibitor/angiotensin II receptor blocker is being taken.  Hyperlipidemia This is a chronic problem. The current episode started more than 1 year ago. The problem is controlled. Exacerbating diseases include diabetes. Pertinent negatives include no chest pain, myalgias or shortness of breath. Risk factors for coronary artery disease include diabetes mellitus, dyslipidemia and a sedentary lifestyle.  Hypertension This is a chronic problem. The current episode started more than 1 year ago. Pertinent negatives include no chest pain, headaches, palpitations or shortness of breath. Risk factors for coronary artery disease include diabetes mellitus, dyslipidemia and sedentary lifestyle. Past treatments include angiotensin blockers.    .  Objective:    BP (!) 153/70   Pulse 62   Ht 5' 1"  (1.549 m)   Wt 123 lb 6.4 oz (56 kg)   BMI 23.32 kg/m   Wt Readings from Last 3 Encounters:  10/16/19 123 lb 6.4 oz (56 kg)  08/06/19 124 lb (56.2 kg)  07/23/19 122 lb (55.3 kg)     Physical Exam- Limited  Constitutional:  Body mass index is 23.32 kg/m. , not in acute distress, normal state of mind Eyes:  EOMI, no exophthalmos Neck: Supple Respiratory: Adequate breathing efforts Musculoskeletal: no gross deformities, strength intact in all four extremities, no gross restriction of joint movements Skin:  no rashes, no hyperemia Neurological: no tremor with outstretched hands.   Results for orders placed or performed in visit on 10/16/19  HgB A1c  Result Value Ref Range   Hemoglobin A1C 7.7 (A) 4.0 - 5.6 %   HbA1c POC (<> result, manual entry)     HbA1c, POC (prediabetic range)     HbA1c, POC (controlled diabetic range)     Complete Blood Count (Most recent): Lab Results  Component Value Date   WBC 5.9 05/16/2017   HGB 12.9 05/16/2017   HCT 40.1 05/16/2017   MCV 93.9 05/16/2017   PLT 331 05/16/2017   Chemistry (most recent): Lab Results  Component Value Date   NA 138 08/01/2019   K 4.5 08/01/2019   CL 101 08/01/2019   CO2 31 08/01/2019   BUN 27 (H) 08/01/2019   CREATININE 1.24 (H) 08/01/2019   Diabetic Labs (most recent): Lab Results  Component Value Date   HGBA1C 7.7 (A) 10/16/2019   HGBA1C 7.4 (H) 05/27/2019   HGBA1C 7.3 (H) 11/25/2018   Lipid Panel     Component Value Date/Time   CHOL 164 04/03/2019 0852   TRIG 44 04/03/2019 0852   HDL 92 04/03/2019 0852   CHOLHDL 1.8 04/03/2019 0852   VLDL 8 10/31/2016 0845   LDLCALC 59 04/03/2019 0852     Assessment & Plan:   1. Type 2 diabetes mellitus with stage 3 chronic kidney disease, with long-term current use of insulin   Patient came with generally improving glycemic profile, has random hypoglycemic episodes.   -Her point-of-care A1c is 7.7%, slightly increasing from 7.4%.  Her average blood glucose  is 150 for the last 7 days, 203 for the last 14 days, 225 for the last 30 days.       Her recent labs were reviewed with her.     Patient remains at a high risk for more acute   and chronic complications of diabetes which include CAD, CVA, CKD, retinopathy, and neuropathy. These are all discussed in detail with the patient. I have re-counseled the patient on diet management and weight loss, by adopting a carbohydrate restricted / protein rich  Diet. - she  admits there is a room for improvement in her diet and drink choices. -  Suggestion is made for her to avoid simple carbohydrates  from her diet including Cakes, Sweet Desserts / Pastries, Ice Cream, Soda (diet and regular), Sweet Tea, Candies, Chips, Cookies, Sweet Pastries,  Store Bought Juices, Alcohol in Excess of  1-2 drinks a day, Artificial Sweeteners, Coffee Creamer, and "Sugar-free" Products. This will help patient to have stable blood glucose profile and potentially avoid unintended weight gain.   I have approached patient to continue on  intensive monitoring of blood glucose and insulin therapy, and patient agrees.   - She continued to do fairly well on premixed insulin.  -She does not have hypoglycemia  Episodes, advised to continue Humalog 75/25  10 units with breakfast and 10 units with supper when pre-meal glucose is above 90 mg/dL . - She is warned not to inject insulin without proper monitoring of blood glucose. She is advised to continue monitoring her blood glucose 4 times a day-before meals and at bedtime.    -Patient is encouraged to call clinic for blood glucose levels less than 70 or above 300 mg /dl. -To give her slightly better control of glycemia, I discussed and added low-dose glipizide 2.5 mg XL p.o. daily at breakfast. -Target numbers for A1c, LDL, HDL, Triglycerides, Waist Circumference were discussed in detail.  2) HTN: Her blood pressure is slightly above target.    She is advised to continue her current blood  pressure medications including spironolactone 25 mg daily, losartan 50 mg p.o. daily.    3. Lipids: Her most recent lipid panel showed LDL improving to 59 from  98.  She  She is advised to stay on omega-3 fatty acids.  4)  Weight/Diet: CDE consult in progress, exercise, and carbs information provided.   4) Chronic Care:  -Patient is on ARB  medications and encouraged to continue to follow up with Ophthalmology, Podiatrist at least yearly or according to recommendations, and advised to stay away from smoking. I have recommended yearly flu vaccine and pneumonia vaccination at least every 5 years; and  sleep for at least 7 hours a day.  - Time spent on this patient care encounter:  35 min, of which > 50% was spent in  counseling and the rest reviewing her blood glucose logs , discussing her hypoglycemia and hyperglycemia episodes, reviewing her current and  previous labs / studies  ( including abstraction from other facilities) and medications  doses and developing a  long term treatment plan and documenting her care.   Please refer to Patient Instructions for Blood Glucose Monitoring and Insulin/Medications Dosing Guide"  in media tab for additional information. Please  also refer to " Patient Self Inventory" in the Media  tab for reviewed elements of pertinent patient history.  Samanth Hewitt Shorts participated in the discussions, expressed understanding, and voiced agreement with the above plans.  All questions were answered to her satisfaction. she is encouraged to contact clinic should she have any questions or concerns prior to her return visit.   Follow up plan: Return in about 4 months (around 02/13/2020) for Bring Meter and Logs- A1c in Office.  Glade Lloyd, MD Phone: [phone]  Fax: [phone]  This note was partially dictated with voice recognition software. Similar sounding words can be transcribed inadequately or may not  be corrected upon review.  10/16/2019, 2:37 PM 

## 2019-10-17 ENCOUNTER — Ambulatory Visit: Payer: Self-pay

## 2019-10-20 ENCOUNTER — Other Ambulatory Visit: Payer: Self-pay | Admitting: "Endocrinology

## 2019-10-26 ENCOUNTER — Ambulatory Visit: Payer: Medicare Other | Attending: Internal Medicine

## 2019-10-26 ENCOUNTER — Other Ambulatory Visit: Payer: Self-pay

## 2019-10-26 ENCOUNTER — Other Ambulatory Visit: Payer: Self-pay | Admitting: Family Medicine

## 2019-10-26 DIAGNOSIS — Z23 Encounter for immunization: Secondary | ICD-10-CM | POA: Insufficient documentation

## 2019-10-26 NOTE — Progress Notes (Signed)
   Covid-19 Vaccination Clinic  Name:  Emily Jennings    MRN: [id number] DOB: 10-Sep-1934  10/26/2019  [name] was observed post Covid-19 immunization for 15 minutes without incidence. She was provided with Vaccine Information Sheet and instruction to access the V-Safe system.   [name] was instructed to call 911 with any severe reactions post vaccine: Marland Kitchen Difficulty breathing  . Swelling of your face and throat  . A fast heartbeat  . A bad rash all over your body  . Dizziness and weakness    Immunizations Administered    Name Date Dose VIS Date Route   Moderna COVID-19 Vaccine 10/26/2019  3:00 PM 0.5 mL 08/19/2019 Intramuscular   Manufacturer: Moderna   Lot: [id number]   [place]: [id number]

## 2019-10-27 ENCOUNTER — Ambulatory Visit (HOSPITAL_COMMUNITY)
Admission: RE | Admit: 2019-10-27 | Discharge: 2019-10-27 | Disposition: A | Discharge status: Home / Self Care | Payer: Medicare Other | Source: Ambulatory Visit | Attending: Family Medicine | Admitting: Family Medicine

## 2019-10-27 ENCOUNTER — Other Ambulatory Visit: Payer: Self-pay

## 2019-10-27 DIAGNOSIS — Z1231 Encounter for screening mammogram for malignant neoplasm of breast: Secondary | ICD-10-CM | POA: Insufficient documentation

## 2019-10-30 ENCOUNTER — Other Ambulatory Visit: Payer: Self-pay

## 2019-10-30 MED ORDER — INSULIN LISPRO PROT & LISPRO (75-25 MIX) 100 UNIT/ML ~~LOC~~ SUSP: 10.0000 [IU] | Freq: Two times a day (BID) | SUBCUTANEOUS | 0 refills | Status: DC

## 2019-11-04 ENCOUNTER — Other Ambulatory Visit: Payer: Self-pay | Admitting: "Endocrinology

## 2019-11-05 ENCOUNTER — Encounter: Payer: Self-pay | Admitting: Family Medicine

## 2019-11-05 ENCOUNTER — Other Ambulatory Visit: Payer: Self-pay

## 2019-11-05 ENCOUNTER — Ambulatory Visit (INDEPENDENT_AMBULATORY_CARE_PROVIDER_SITE_OTHER): Payer: Medicare Other | Admitting: Family Medicine

## 2019-11-05 VITALS — BP 153/70 | HR 62 | Resp 15 | Ht 61.0 in | Wt 123.0 lb

## 2019-11-05 DIAGNOSIS — Z0000 Encounter for general adult medical examination without abnormal findings: Secondary | ICD-10-CM

## 2019-11-05 NOTE — Patient Instructions (Signed)
Emily Jennings , Thank you for taking time to come for your Medicare Wellness Visit. I appreciate your ongoing commitment to your health goals. Please review the following plan we discussed and let me know if I can assist you in the future.   Please continue to practice social distancing to keep you, your family, and our community safe.  If you must go out, please wear a Mask and practice good handwashing.  Screening recommendations/referrals: Colonoscopy: up to date Mammogram: up to date Bone Density: up to date  Recommended yearly ophthalmology/optometry visit for glaucoma screening and checkup Recommended yearly dental visit for hygiene and checkup  Vaccinations: Influenza vaccine: up to date  Pneumococcal vaccine: up to date  Tdap vaccine: up to date  Shingles vaccine: declined  Advanced directives:  completed  Conditions/risks identified: Falls  Next appointment: 12/17/2019  Preventive Care 84 Years and Older, Female Preventive care refers to lifestyle choices and visits with your health care provider that can promote health and wellness. What does preventive care include?  A yearly physical exam. This is also called an annual well check.  Dental exams once or twice a year.  Routine eye exams. Ask your health care provider how often you should have your eyes checked.  Personal lifestyle choices, including:  Daily care of your teeth and gums.  Regular physical activity.  Eating a healthy diet.  Avoiding tobacco and drug use.  Limiting alcohol use.  Practicing safe sex.  Taking low-dose aspirin every day.  Taking vitamin and mineral supplements as recommended by your health care provider. What happens during an annual well check? The services and screenings done by your health care provider during your annual well check will depend on your age, overall health, lifestyle risk factors, and family history of disease. Counseling  Your health care provider may ask you  questions about your:  Alcohol use.  Tobacco use.  Drug use.  Emotional well-being.  Home and relationship well-being.  Sexual activity.  Eating habits.  History of falls.  Memory and ability to understand (cognition).  Work and work Statistician.  Reproductive health. Screening  You may have the following tests or measurements:  Height, weight, and BMI.  Blood pressure.  Lipid and cholesterol levels. These may be checked every 5 years, or more frequently if you are over 67 years old.  Skin check.  Lung cancer screening. You may have this screening every year starting at age 5 if you have a 30-pack-year history of smoking and currently smoke or have quit within the past 15 years.  Fecal occult blood test (FOBT) of the stool. You may have this test every year starting at age 67.  Flexible sigmoidoscopy or colonoscopy. You may have a sigmoidoscopy every 5 years or a colonoscopy every 10 years starting at age 62.  Hepatitis C blood test.  Hepatitis B blood test.  Sexually transmitted disease (STD) testing.  Diabetes screening. This is done by checking your blood sugar (glucose) after you have not eaten for a while (fasting). You may have this done every 1-3 years.  Bone density scan. This is done to screen for osteoporosis. You may have this done starting at age 5.  Mammogram. This may be done every 1-2 years. Talk to your health care provider about how often you should have regular mammograms. Talk with your health care provider about your test results, treatment options, and if necessary, the need for more tests. Vaccines  Your health care provider may recommend certain vaccines, such  as:  Influenza vaccine. This is recommended every year.  Tetanus, diphtheria, and acellular pertussis (Tdap, Td) vaccine. You may need a Td booster every 10 years.  Zoster vaccine. You may need this after age 11.  Pneumococcal 13-valent conjugate (PCV13) vaccine. One dose is  recommended after age 36.  Pneumococcal polysaccharide (PPSV23) vaccine. One dose is recommended after age 64. Talk to your health care provider about which screenings and vaccines you need and how often you need them. This information is not intended to replace advice given to you by your health care provider. Make sure you discuss any questions you have with your health care provider. Document Released: 10/01/2015 Document Revised: 05/24/2016 Document Reviewed: 07/06/2015 Elsevier Interactive Patient Education  2017 [place] Prevention in the Home Falls can cause injuries. They can happen to people of all ages. There are many things you can do to make your home safe and to help prevent falls. What can I do on the outside of my home?  Regularly fix the edges of walkways and driveways and fix any cracks.  Remove anything that might make you trip as you walk through a door, such as a raised step or threshold.  Trim any bushes or trees on the path to your home.  Use bright outdoor lighting.  Clear any walking paths of anything that might make someone trip, such as rocks or tools.  Regularly check to see if handrails are loose or broken. Make sure that both sides of any steps have handrails.  Any raised decks and porches should have guardrails on the edges.  Have any leaves, snow, or ice cleared regularly.  Use sand or salt on walking paths during winter.  Clean up any spills in your garage right away. This includes oil or grease spills. What can I do in the bathroom?  Use night lights.  Install grab bars by the toilet and in the tub and shower. Do not use towel bars as grab bars.  Use non-skid mats or decals in the tub or shower.  If you need to sit down in the shower, use a plastic, non-slip stool.  Keep the floor dry. Clean up any water that spills on the floor as soon as it happens.  Remove soap buildup in the tub or shower regularly.  Attach bath mats  securely with double-sided non-slip rug tape.  Do not have throw rugs and other things on the floor that can make you trip. What can I do in the bedroom?  Use night lights.  Make sure that you have a light by your bed that is easy to reach.  Do not use any sheets or blankets that are too big for your bed. They should not hang down onto the floor.  Have a firm chair that has side arms. You can use this for support while you get dressed.  Do not have throw rugs and other things on the floor that can make you trip. What can I do in the kitchen?  Clean up any spills right away.  Avoid walking on wet floors.  Keep items that you use a lot in easy-to-reach places.  If you need to reach something above you, use a strong step stool that has a grab bar.  Keep electrical cords out of the way.  Do not use floor polish or wax that makes floors slippery. If you must use wax, use non-skid floor wax.  Do not have throw rugs and other things on the  floor that can make you trip. What can I do with my stairs?  Do not leave any items on the stairs.  Make sure that there are handrails on both sides of the stairs and use them. Fix handrails that are broken or loose. Make sure that handrails are as long as the stairways.  Check any carpeting to make sure that it is firmly attached to the stairs. Fix any carpet that is loose or worn.  Avoid having throw rugs at the top or bottom of the stairs. If you do have throw rugs, attach them to the floor with carpet tape.  Make sure that you have a light switch at the top of the stairs and the bottom of the stairs. If you do not have them, ask someone to add them for you. What else can I do to help prevent falls?  Wear shoes that:  Do not have high heels.  Have rubber bottoms.  Are comfortable and fit you well.  Are closed at the toe. Do not wear sandals.  If you use a stepladder:  Make sure that it is fully opened. Do not climb a closed  stepladder.  Make sure that both sides of the stepladder are locked into place.  Ask someone to hold it for you, if possible.  Clearly mark and make sure that you can see:  Any grab bars or handrails.  First and last steps.  Where the edge of each step is.  Use tools that help you move around (mobility aids) if they are needed. These include:  Canes.  Walkers.  Scooters.  Crutches.  Turn on the lights when you go into a dark area. Replace any light bulbs as soon as they burn out.  Set up your furniture so you have a clear path. Avoid moving your furniture around.  If any of your floors are uneven, fix them.  If there are any pets around you, be aware of where they are.  Review your medicines with your doctor. Some medicines can make you feel dizzy. This can increase your chance of falling. Ask your doctor what other things that you can do to help prevent falls. This information is not intended to replace advice given to you by your health care provider. Make sure you discuss any questions you have with your health care provider. Document Released: 07/01/2009 Document Revised: 02/10/2016 Document Reviewed: 10/09/2014 Elsevier Interactive Patient Education  2017 [employer].

## 2019-11-05 NOTE — Progress Notes (Signed)
Subjective:   Emily Jennings is a 86 y.o. female who presents for Medicare Annual (Subsequent) preventive examination.  Location of Patient: Home Location of Provider: Telehealth Consent was obtain for visit to be over via telehealth.   I verified that I am speaking with the correct person using two identifiers.   Review of Systems:    Cardiac Risk Factors include: advanced age (>37mn, >>7women);diabetes mellitus;dyslipidemia;hypertension     Objective:     Vitals: BP (!) 153/70   Pulse 62   Resp 15   Ht 5' 1"  (1.549 m)   Wt 123 lb (55.8 kg)   BMI 23.24 kg/m   Body mass index is 23.24 kg/m.  Advanced Directives 10/09/2018 09/03/2018 10/01/2017 09/28/2016 03/29/2015 01/06/2015  Does Patient Have a Medical Advance Directive? No Yes Yes No No No  Would patient like information on creating a medical advance directive? Yes (ED - Information included in AVS) - - Yes (MAU/Ambulatory/Procedural Areas - Information given) - No - patient declined information    Tobacco Social History   Tobacco Use  Smoking Status Never Smoker  Smokeless Tobacco Never Used     Counseling given: Yes   Clinical Intake:  Pre-visit preparation completed: Yes  Pain : No/denies pain Pain Score: 0-No pain     Nutritional Status: BMI of 19-24  Normal Nutritional Risks: None Diabetes: Yes CBG done?: No Did pt. bring in CBG monitor from home?: No  How often do you need to have someone help you when you read instructions, pamphlets, or other written materials from your doctor or pharmacy?: 2 - Rarely What is the last grade level you completed in school?: 12  Interpreter Needed?: No     Past Medical History:  Diagnosis Date  . Allergy   . Anxiety   . Arthritis   . Cancer ([facility] 2009   breast, carcinoma in situ left  . Carotid stenosis    11/16/2005  mild plaque formation and stenosis proximal right ECA  . Cataract   . Complication of anesthesia   . Coronary artery disease     cardiac catheterization on 03/20/2006  LAD mid 40% stenosis, left circumflex mild 40% stenosis, RCA mid-vessel 40% to 50% lesion   EF 60%  . Diabetes mellitus   . GERD (gastroesophageal reflux disease)   . Hernia, inguinal    left  . Hyperglycemia   . Hypertension   . Low blood potassium   . [place] dependent [place] 2 [place]   . Osteoporosis   . Shortness of breath    2D Echocardiogram 01/26/2009   EF of greater than 55%, mild MR, mild TR, normal ventricular function  . Ventricular tachycardia, non-sustained (HCC)    developed during stress test 02/08/2006, spontaneously aborted, mild reversible apical defect   Past Surgical History:  Procedure Laterality Date  . BREAST LUMPECTOMY Left 2009   Left breast 2009  . CATARACT EXTRACTION W/PHACO Left 10/28/2014   Procedure: PHACO EMULSION CATARACT EXTRACTION WITH INTRAOCULAR LENS IMPLANT LEFT EYE (IOC);  Surgeon: RMarylynn Pearson MD;  [facility]  Service: Ophthalmology;  Laterality: Left;  . COLONOSCOPY    . cyst removed from left foot     Family History  Problem Relation Age of Onset  . Hypertension Mother   . Hyperlipidemia Mother   . Stroke Mother   . Cancer Father        pancreatic  . Colon cancer Father   . Heart disease Brother 471  bypass  . Heart disease Brother 18       bypass  . Arthritis Other   . Asthma Other   . Diabetes Other   . Colon cancer Paternal Aunt   . Esophageal cancer Neg Hx   . Stomach cancer Neg Hx   . Rectal cancer Neg Hx    Social History   Socioeconomic History  . Marital status: Married    Spouse name: Not on file  . Number of children: 2  . Years of education: na  . Highest education level: Some college, no degree  Occupational History  . Occupation: retired    Fish farm manager: retired  Tobacco Use  . Smoking status: Never Smoker  . Smokeless tobacco: Never Used  Substance and Sexual Activity  . Alcohol use: No  . Drug use: No  . Sexual activity: Not Currently     Birth control/protection: Post-menopausal  Other Topics Concern  . Not on file  Social History Narrative   Lives with her husband and son and attends yoga at the [facility] 2 days a week and speaks to her grandson nightly on the phone   Social Determinants of Health   Financial Resource Strain:   . Difficulty of Paying Living Expenses: Not on file  Food Insecurity:   . Worried About Charity fundraiser in the Last Year: Not on file  . Ran Out of Food in the Last Year: Not on file  Transportation Needs:   . Lack of Transportation (Medical): Not on file  . Lack of Transportation (Non-Medical): Not on file  Physical Activity:   . Days of Exercise per Week: Not on file  . Minutes of Exercise per Session: Not on file  Stress:   . Feeling of Stress : Not on file  Social Connections:   . Frequency of Communication with Friends and Family: Not on file  . Frequency of Social Gatherings with Friends and Family: Not on file  . Attends Religious Services: Not on file  . Active Member of Clubs or Organizations: Not on file  . Attends Archivist Meetings: Not on file  . Marital Status: Not on file    Outpatient Encounter Medications as of 11/05/2019  Medication Sig  . acetaminophen (TYLENOL) 500 MG tablet Take one tablet two times daily for 3 days, then as needed  . alendronate (FOSAMAX) 70 MG tablet TAKE 1 TABLET BY MOUTH EVERY 7 DAYS. TAKE WITH A FULL GLASS OF WATER ON AN EMPTY STOMACH  . amLODipine (NORVASC) 10 MG tablet TAKE 1 TABLET BY MOUTH EVERY DAY  . aspirin EC 81 MG tablet Take 81 mg by mouth daily.  . B-D ULTRAFINE III SHORT PEN 31G X 8 MM MISC USE AS DIRECTED  . BD INSULIN SYRINGE U/F 31G X 5/16" 0.5 ML MISC USE AS DIRECTED TWICE DAILY  . blood glucose meter kit and supplies KIT One touch Ultra. Use up to two times daily as directed. (FOR ICD E11.65)  . cholecalciferol (VITAMIN D3) 25 MCG (1000 UT) tablet Take 1,000 Units by mouth daily.  Marland Kitchen glipiZIDE (GLUCOTROL XL) 2.5 MG 24  hr tablet Take 1 tablet (2.5 mg total) by mouth daily with breakfast.  . HUMALOG MIX 75/25 (75-25) 100 UNIT/ML SUSP injection INJECT 10 UNITS INTO THE SKIN TWICE DAILY.  Marland Kitchen KLOR-CON M20 20 MEQ tablet TAKE 1 TABLET BY MOUTH TWICE A DAY  . losartan (COZAAR) 50 MG tablet Take 1 tablet (50 mg total) by mouth daily.  . metoprolol  tartrate (LOPRESSOR) 50 MG tablet TAKE 1 TABLET BY MOUTH TWICE A DAY  . omega-3 acid ethyl esters (LOVAZA) 1 g capsule Take by mouth 2 (two) times daily.  Glory Rosebush DELICA LANCETS 32T MISC USE TO OBTAIN A BLOOD SPECIMEN 2 TIMES A DAY  . ONETOUCH ULTRA test strip USE TO TEST BLOOD SUGAR 4 TIMES A DAY (E11.65)  . rosuvastatin (CRESTOR) 5 MG tablet TAKE 1 TABLET BY MOUTH EVERYDAY AT BEDTIME  . spironolactone (ALDACTONE) 25 MG tablet Take 1 tablet (25 mg total) by mouth daily.   No facility-administered encounter medications on file as of 11/05/2019.    Activities of Daily Living In your present state of health, do you have any difficulty performing the following activities: 11/05/2019  Hearing? Y  Vision? N  Difficulty concentrating or making decisions? N  Walking or climbing stairs? N  Dressing or bathing? N  Doing errands, shopping? N  Preparing Food and eating ? N  Using the Toilet? N  In the past six months, have you accidently leaked urine? N  Do you have problems with loss of bowel control? N  Managing your Medications? N  Managing your Finances? N  Housekeeping or managing your Housekeeping? N  Some recent data might be hidden    Patient Care Team: [name], MD as PCP - General (Family Medicine) Cassandria Anger, MD as Consulting Physician (Endocrinology) Marylynn Pearson, MD as Consulting Physician (Ophthalmology)    Assessment:   This is a routine wellness examination for Deborra.  Exercise Activities and Dietary recommendations Current Exercise Habits: Home exercise routine, Type of exercise: strength training/weights;walking, Time  (Minutes): 20, Frequency (Times/Week): 6, Weekly Exercise (Minutes/Week): 120, Intensity: Mild, Exercise limited by: None identified  Goals    . Exercise 3x per week (30 min per time) (pt-stated)     Starting 09/29/2016 I would like to start exercising 20 minutes a day every day of the week.    Marland Kitchen HEMOGLOBIN A1C < 7.0     Cut carbs and portion sizes     . Prevent falls     Slow down and take her time       Fall Risk Fall Risk  11/05/2019 08/06/2019 07/23/2019 07/01/2019 01/23/2019  Falls in the past year? 0 0 0 0 0  Number falls in past yr: 0 0 0 0 0  Injury with Fall? 0 0 0 0 0  Risk for fall due to : - - - - -  Risk for fall due to: Comment - - - - -  Follow up Falls evaluation completed;Education provided;Falls prevention discussed - - - -   Is the patient's home free of loose throw rugs in walkways, pet beds, electrical cords, etc?   yes      Grab bars in the bathroom? yes      Handrails on the stairs?   yes      Adequate lighting?   yes   Depression Screen PHQ 2/9 Scores 11/05/2019 07/23/2019 07/01/2019 10/09/2018  PHQ - 2 Score 0 0 0 3  PHQ- 9 Score - - - 6     Cognitive Function     6CIT Screen 11/05/2019 10/09/2018 10/01/2017 09/28/2016  What Year? 0 points 0 points 0 points 0 points  What month? 0 points 0 points 0 points 0 points  What time? 0 points 0 points 0 points 0 points  Count back from 20 0 points 0 points 2 points 0 points  Months in reverse 0 points  0 points 0 points 0 points  Repeat phrase 2 points 2 points 2 points 2 points  Total Score 2 2 4 2     Immunization History  Administered Date(s) Administered  . Fluad Quad(high Dose 65+) 06/02/2019  . Influenza Split 07/17/2011  . Influenza Whole 06/18/2007, 07/29/2009, 05/16/2013  . Influenza, High Dose Seasonal PF 08/13/2018  . Influenza,inj,Quad PF,6+ Mos 05/19/2014, 08/22/2016, 05/17/2017  . Moderna SARS-COVID-2 Vaccination 10/26/2019  . Pneumococcal Conjugate-13 03/24/2014  . Pneumococcal  Polysaccharide-23 05/20/2008  . Td 06/18/2002  . Tdap 07/17/2011    Qualifies for Shingles Vaccine? Declined   Screening Tests Health Maintenance  Topic Date Due  . OPHTHALMOLOGY EXAM  04/12/2018  . HEMOGLOBIN A1C  04/14/2020  . FOOT EXAM  07/01/2020  . TETANUS/TDAP  07/16/2021  . INFLUENZA VACCINE  Completed  . DEXA SCAN  Completed  . PNA vac Low Risk Adult  Completed    Cancer Screenings: Lung: Low Dose CT Chest recommended if Age 22-80 years, 30 pack-year currently smoking OR have quit w/in 15years. Patient does not qualify. Breast:  Up to date on Mammogram? Yes   Up to date of Bone Density/Dexa? Yes Colorectal:  n/a  Additional Screenings:   Hepatitis C Screening:      Plan:       1. Encounter for Medicare annual wellness exam  I have personally reviewed and noted the following in the patient's chart:   . Medical and social history . Use of alcohol, tobacco or illicit drugs  . Current medications and supplements . Functional ability and status . Nutritional status . Physical activity . Advanced directives . List of other physicians . Hospitalizations, surgeries, and ER visits in previous 12 months . Vitals . Screenings to include cognitive, depression, and falls . Referrals and appointments  In addition, I have reviewed and discussed with patient certain preventive protocols, quality metrics, and best practice recommendations. A written personalized care plan for preventive services as well as general preventive health recommendations were provided to patient.     I provided 20 minutes of non-face-to-face time during this encounter.    Perlie Mayo, NP  11/05/2019

## 2019-11-12 ENCOUNTER — Other Ambulatory Visit: Payer: Self-pay | Admitting: "Endocrinology

## 2019-11-19 ENCOUNTER — Other Ambulatory Visit: Payer: Self-pay | Admitting: Family Medicine

## 2019-11-25 ENCOUNTER — Telehealth: Payer: Self-pay

## 2019-11-25 ENCOUNTER — Other Ambulatory Visit: Payer: Self-pay

## 2019-11-25 DIAGNOSIS — I10 Essential (primary) hypertension: Secondary | ICD-10-CM

## 2019-11-25 MED ORDER — LOSARTAN POTASSIUM 50 MG PO TABS: 50.0000 mg | ORAL_TABLET | Freq: Every day | ORAL | 1 refills | Status: DC

## 2019-11-25 NOTE — Telephone Encounter (Signed)
Pt needs her losartin \\BP  refilled

## 2019-11-25 NOTE — Telephone Encounter (Signed)
Refill sent in

## 2019-11-26 ENCOUNTER — Ambulatory Visit: Payer: Medicare Other | Attending: Internal Medicine

## 2019-11-26 ENCOUNTER — Other Ambulatory Visit: Payer: Self-pay

## 2019-11-26 DIAGNOSIS — Z23 Encounter for immunization: Secondary | ICD-10-CM

## 2019-11-26 NOTE — Progress Notes (Signed)
   Covid-19 Vaccination Clinic  Name:  SOHA THORUP    MRN: [id number] DOB: 1934/07/19  11/26/2019  Ms. Sheets was observed post Covid-19 immunization for 15 minutes without incident. She was provided with Vaccine Information Sheet and instruction to access the V-Safe system.   Ms. Eddleman was instructed to call 911 with any severe reactions post vaccine: Marland Kitchen Difficulty breathing  . Swelling of face and throat  . A fast heartbeat  . A bad rash all over body  . Dizziness and weakness   Immunizations Administered    Name Date Dose VIS Date Route   Moderna COVID-19 Vaccine 11/26/2019  3:45 PM 0.5 mL 08/19/2019 Intramuscular   Manufacturer: Moderna   Lot: [id number]   [place]: [id number]

## 2019-12-17 ENCOUNTER — Ambulatory Visit: Payer: PPO | Admitting: Family Medicine

## 2019-12-22 ENCOUNTER — Other Ambulatory Visit: Payer: Self-pay | Admitting: Family Medicine

## 2020-01-08 ENCOUNTER — Other Ambulatory Visit: Payer: Self-pay | Admitting: "Endocrinology

## 2020-01-19 ENCOUNTER — Other Ambulatory Visit: Payer: Self-pay | Admitting: "Endocrinology

## 2020-01-21 ENCOUNTER — Other Ambulatory Visit: Payer: Self-pay

## 2020-01-21 MED ORDER — INSULIN PEN NEEDLE 32G X 4 MM MISC: 1.0000 | 3 refills | Status: DC

## 2020-01-22 ENCOUNTER — Other Ambulatory Visit: Payer: Self-pay

## 2020-01-22 MED ORDER — INSULIN PEN NEEDLE 32G X 4 MM MISC: 1.0000 | 3 refills | Status: DC

## 2020-01-26 ENCOUNTER — Other Ambulatory Visit: Payer: Self-pay

## 2020-01-26 ENCOUNTER — Other Ambulatory Visit: Payer: Self-pay | Admitting: Family Medicine

## 2020-01-26 MED ORDER — "INSULIN SYRINGE-NEEDLE U-100 31G X 5/16"" 0.5 ML MISC": 3 refills | Status: DC

## 2020-01-29 ENCOUNTER — Telehealth: Payer: Self-pay

## 2020-01-29 ENCOUNTER — Other Ambulatory Visit: Payer: Self-pay | Admitting: *Deleted

## 2020-01-29 NOTE — Telephone Encounter (Signed)
Klor-con mg 22 needs refill please call to [facility] [place]

## 2020-01-29 NOTE — Telephone Encounter (Signed)
Medication was sent 01-27-20 pt notified

## 2020-02-17 ENCOUNTER — Ambulatory Visit: Payer: Medicare Other | Admitting: "Endocrinology

## 2020-02-25 ENCOUNTER — Encounter: Payer: Self-pay | Admitting: "Endocrinology

## 2020-02-25 ENCOUNTER — Ambulatory Visit (INDEPENDENT_AMBULATORY_CARE_PROVIDER_SITE_OTHER): Payer: Medicare Other | Admitting: "Endocrinology

## 2020-02-25 ENCOUNTER — Other Ambulatory Visit: Payer: Self-pay

## 2020-02-25 VITALS — BP 173/74 | HR 65 | Ht 61.0 in | Wt 126.2 lb

## 2020-02-25 DIAGNOSIS — I10 Essential (primary) hypertension: Secondary | ICD-10-CM | POA: Diagnosis not present

## 2020-02-25 DIAGNOSIS — N1831 Chronic kidney disease, stage 3a: Secondary | ICD-10-CM | POA: Diagnosis not present

## 2020-02-25 DIAGNOSIS — Z794 Long term (current) use of insulin: Secondary | ICD-10-CM

## 2020-02-25 DIAGNOSIS — E782 Mixed hyperlipidemia: Secondary | ICD-10-CM

## 2020-02-25 DIAGNOSIS — E1121 Type 2 diabetes mellitus with diabetic nephropathy: Secondary | ICD-10-CM

## 2020-02-25 LAB — POCT GLYCOSYLATED HEMOGLOBIN (HGB A1C): Hemoglobin A1C: 7.2 % — AB (ref 4.0–5.6)

## 2020-02-25 NOTE — Progress Notes (Signed)
02/25/2020  Endocrinology follow-up note   Subjective:    Patient ID: Emily Jennings, female    DOB: 10-Nov-1933,    Past Medical History:  Diagnosis Date  . Allergy   . Anxiety   . Arthritis   . Cancer [facility]) 2009   breast, carcinoma in situ left  . Carotid stenosis    11/16/2005  mild plaque formation and stenosis proximal right ECA  . Cataract   . Complication of anesthesia   . Coronary artery disease    cardiac catheterization on 03/20/2006  LAD mid 40% stenosis, left circumflex mild 40% stenosis, RCA mid-vessel 40% to 50% lesion   EF 60%  . Diabetes mellitus   . GERD (gastroesophageal reflux disease)   . Hernia, inguinal    left  . Hyperglycemia   . Hypertension   . Low blood potassium   . Non-insulin dependent type 2 diabetes mellitus ([place])   . Osteoporosis   . Shortness of breath    2D Echocardiogram 01/26/2009   EF of greater than 55%, mild MR, mild TR, normal ventricular function  . Ventricular tachycardia, non-sustained (HCC)    developed during stress test 02/08/2006, spontaneously aborted, mild reversible apical defect   Past Surgical History:  Procedure Laterality Date  . BREAST LUMPECTOMY Left 2009   Left breast 2009  . CATARACT EXTRACTION W/PHACO Left 10/28/2014   Procedure: PHACO EMULSION CATARACT EXTRACTION WITH INTRAOCULAR LENS IMPLANT LEFT EYE (IOC);  Surgeon: Marylynn Pearson, MD;  Location: [facility];  Service: Ophthalmology;  Laterality: Left;  . COLONOSCOPY    . cyst removed from left foot     Social History   Socioeconomic History  . Marital status: Married    Spouse name: Not on file  . Number of children: 2  . Years of education: na  . Highest education level: Some college, no degree  Occupational History  . Occupation: retired    Fish farm manager: retired  Tobacco Use  . Smoking status: Never Smoker  . Smokeless tobacco: Never Used  Substance and Sexual Activity  . Alcohol use: No  . Drug use: No  . Sexual activity: Not Currently    Birth  control/protection: Post-menopausal  Other Topics Concern  . Not on file  Social History Narrative   Lives with her husband and son and attends yoga at the [facility] 2 days a week and speaks to her grandson nightly on the phone   Social Determinants of Health   Financial Resource Strain:   . Difficulty of Paying Living Expenses:   Food Insecurity:   . Worried About Charity fundraiser in the Last Year:   . Arboriculturist in the Last Year:   Transportation Needs:   . Film/video editor (Medical):   Marland Kitchen Lack of Transportation (Non-Medical):   Physical Activity:   . Days of Exercise per Week:   . Minutes of Exercise per Session:   Stress:   . Feeling of Stress :   Social Connections:   . Frequency of Communication with Friends and Family:   . Frequency of Social Gatherings with Friends and Family:   . Attends Religious Services:   . Active Member of Clubs or Organizations:   . Attends Archivist Meetings:   Marland Kitchen Marital Status:    Outpatient Encounter Medications as of 02/25/2020  Medication Sig  . acetaminophen (TYLENOL) 500 MG tablet Take one tablet two times daily for 3 days, then as needed  . alendronate (FOSAMAX) 70 MG tablet  TAKE 1 TABLET BY MOUTH EVERY 7 DAYS. TAKE WITH A FULL GLASS OF WATER ON AN EMPTY STOMACH  . amLODipine (NORVASC) 10 MG tablet TAKE 1 TABLET BY MOUTH EVERY DAY  . aspirin EC 81 MG tablet Take 81 mg by mouth daily.  . blood glucose meter kit and supplies KIT One touch Ultra. Use up to two times daily as directed. (FOR ICD E11.65)  . cholecalciferol (VITAMIN D3) 25 MCG (1000 UT) tablet Take 1,000 Units by mouth daily.  Marland Kitchen glipiZIDE (GLUCOTROL XL) 2.5 MG 24 hr tablet TAKE 1 TABLET (2.5 MG TOTAL) BY MOUTH DAILY WITH BREAKFAST.  Marland Kitchen HUMALOG MIX 75/25 (75-25) 100 UNIT/ML SUSP injection INJECT 10 UNITS INTO THE SKIN TWICE DAILY.  Marland Kitchen Insulin Pen Needle 32G X 4 MM MISC 1 each by Does not apply route as directed.  . Insulin Syringe-Needle U-100 (BD INSULIN SYRINGE  U/F) 31G X 5/16" 0.5 ML MISC USE AS DIRECTED TWICE DAILY  . KLOR-CON M20 20 MEQ tablet TAKE 1 TABLET BY MOUTH TWICE A DAY  . losartan (COZAAR) 50 MG tablet Take 1 tablet (50 mg total) by mouth daily.  . metoprolol tartrate (LOPRESSOR) 50 MG tablet TAKE 1 TABLET BY MOUTH TWICE A DAY  . omega-3 acid ethyl esters (LOVAZA) 1 g capsule Take by mouth 2 (two) times daily.  Glory Rosebush Delica Lancets 14H MISC USE AS DIRECTED TO CHECK BLOOD GLUCOSE FOUR TIMES DAILY  . ONETOUCH ULTRA test strip USE TO TEST BLOOD SUGAR 4 TIMES A DAY (E11.65)  . rosuvastatin (CRESTOR) 5 MG tablet TAKE 1 TABLET BY MOUTH EVERYDAY AT BEDTIME  . spironolactone (ALDACTONE) 25 MG tablet Take 1 tablet (25 mg total) by mouth daily.   No facility-administered encounter medications on file as of 02/25/2020.   ALLERGIES: Allergies  Allergen Reactions  . Benadryl [Diphenhydramine Hcl] Hypertension  . Citalopram   . Metformin And Related Diarrhea  . Tramadol Other (See Comments)    Felt light headed and dizzy  . Tylenol [Acetaminophen] Other (See Comments)    Patient stated tylenol makes her "feel high"   VACCINATION STATUS: Immunization History  Administered Date(s) Administered  . Fluad Quad(high Dose 65+) 06/02/2019  . Influenza Split 07/17/2011  . Influenza Whole 06/18/2007, 07/29/2009, 05/16/2013  . Influenza, High Dose Seasonal PF 08/13/2018  . Influenza,inj,Quad PF,6+ Mos 05/19/2014, 08/22/2016, 05/17/2017  . Moderna SARS-COVID-2 Vaccination 10/26/2019, 11/26/2019  . Pneumococcal Conjugate-13 03/24/2014  . Pneumococcal Polysaccharide-23 05/20/2008  . Td 06/18/2002  . Tdap 07/17/2011    Diabetes She presents for her follow-up diabetic visit. She has type 2 diabetes mellitus. Onset time: She was diagnosed at approximate age of 29 years. Her disease course has been improving. There are no hypoglycemic associated symptoms. Pertinent negatives for hypoglycemia include no confusion, headaches, pallor or seizures. There  are no diabetic associated symptoms. Pertinent negatives for diabetes include no chest pain, no fatigue, no polydipsia, no polyphagia and no polyuria. There are no hypoglycemic complications. Symptoms are improving. Diabetic complications include nephropathy. Risk factors for coronary artery disease include diabetes mellitus, dyslipidemia, hypertension and sedentary lifestyle. Current diabetic treatment includes insulin injections. She is compliant with treatment most of the time. Her weight is fluctuating minimally. She is following a generally unhealthy diet. When asked about meal planning, she reported none. She has had a previous visit with a dietitian. She never participates in exercise. She monitors blood glucose at home 3-4 x per day. Her home blood glucose trend is decreasing steadily. Her breakfast blood glucose range  is generally 140-180 mg/dl. Her lunch blood glucose range is generally 140-180 mg/dl. Her dinner blood glucose range is generally 140-180 mg/dl. Her bedtime blood glucose range is generally 140-180 mg/dl. Her overall blood glucose range is 140-180 mg/dl. An ACE inhibitor/angiotensin II receptor blocker is being taken.  Hyperlipidemia This is a chronic problem. The current episode started more than 1 year ago. The problem is controlled. Exacerbating diseases include diabetes. Pertinent negatives include no chest pain, myalgias or shortness of breath. Risk factors for coronary artery disease include diabetes mellitus, dyslipidemia and a sedentary lifestyle.  Hypertension This is a chronic problem. The current episode started more than 1 year ago. Pertinent negatives include no chest pain, headaches, palpitations or shortness of breath. Risk factors for coronary artery disease include diabetes mellitus, dyslipidemia and sedentary lifestyle. Past treatments include angiotensin blockers.      Review of systems  Constitutional: + Minimally fluctuating body weight,  current  Body mass index  is 23.85 kg/m. , no fatigue, no subjective hyperthermia, no subjective hypothermia Eyes: no blurry vision, no xerophthalmia ENT: no sore throat, no nodules palpated in throat, no dysphagia/odynophagia, no hoarseness Cardiovascular: no Chest Pain, no Shortness of Breath, no palpitations, no leg swelling Respiratory: no cough, no shortness of breath Gastrointestinal: no Nausea/Vomiting/Diarhhea Musculoskeletal: no muscle/joint aches Skin: no rashes, no hyperemia Neurological: no tremors, no numbness, no tingling, no dizziness Psychiatric: no depression, no anxiety   Objective:    BP (!) 173/74   Pulse 65   Ht 5' 1"  (1.549 m)   Wt 126 lb 3.2 oz (57.2 kg)   BMI 23.85 kg/m   Wt Readings from Last 3 Encounters:  02/25/20 126 lb 3.2 oz (57.2 kg)  11/05/19 123 lb (55.8 kg)  10/16/19 123 lb 6.4 oz (56 kg)     Physical Exam- Limited  Constitutional:  Body mass index is 23.85 kg/m. , not in acute distress, normal state of mind Eyes:  EOMI, no exophthalmos Neck: Supple Thyroid: No gross goiter Respiratory: Adequate breathing efforts Musculoskeletal: no gross deformities, strength intact in all four extremities, no gross restriction of joint movements Skin:  no rashes, no hyperemia Neurological: no tremor with outstretched hands,    Results for orders placed or performed in visit on 02/25/20  HgB A1c  Result Value Ref Range   Hemoglobin A1C 7.2 (A) 4.0 - 5.6 %   HbA1c POC (<> result, manual entry)     HbA1c, POC (prediabetic range)     HbA1c, POC (controlled diabetic range)     Complete Blood Count (Most recent): Lab Results  Component Value Date   WBC 5.9 05/16/2017   HGB 12.9 05/16/2017   HCT 40.1 05/16/2017   MCV 93.9 05/16/2017   PLT 331 05/16/2017   Chemistry (most recent): Lab Results  Component Value Date   NA 138 08/01/2019   K 4.5 08/01/2019   CL 101 08/01/2019   CO2 31 08/01/2019   BUN 27 (H) 08/01/2019   CREATININE 1.24 (H) 08/01/2019   Diabetic Labs  (most recent): Lab Results  Component Value Date   HGBA1C 7.2 (A) 02/25/2020   HGBA1C 7.7 (A) 10/16/2019   HGBA1C 7.4 (H) 05/27/2019   Lipid Panel     Component Value Date/Time   CHOL 164 04/03/2019 0852   TRIG 44 04/03/2019 0852   HDL 92 04/03/2019 0852   CHOLHDL 1.8 04/03/2019 0852   VLDL 8 10/31/2016 0845   LDLCALC 59 04/03/2019 0852     Assessment & Plan:  1. Type 2 diabetes mellitus with stage 3 chronic kidney disease, with long-term current use of insulin   Patient came with generally improving glycemic profile, has random hypoglycemic episodes.   -Her point-of-care A1c is 7.2% improving from 7.7%.  Her average blood glucose is between 150-165.    Her recent labs were reviewed with her.     Patient remains at a high risk for more acute and chronic complications of diabetes which include CAD, CVA, CKD, retinopathy, and neuropathy. These are all discussed in detail with the patient. I have re-counseled the patient on diet management and weight loss, by adopting a carbohydrate restricted / protein rich  Diet.  - she  admits there is a room for improvement in her diet and drink choices. -  Suggestion is made for her to avoid simple carbohydrates  from her diet including Cakes, Sweet Desserts / Pastries, Ice Cream, Soda (diet and regular), Sweet Tea, Candies, Chips, Cookies, Sweet Pastries,  Store Bought Juices, Alcohol in Excess of  1-2 drinks a day, Artificial Sweeteners, Coffee Creamer, and "Sugar-free" Products. This will help patient to have stable blood glucose profile and potentially avoid unintended weight gain.  I have approached patient to continue on  intensive monitoring of blood glucose and insulin therapy, and patient agrees.   - She continued to do fairly well on premixed insulin.  -She does  have random hypoglycemic episodes.  She is advised to avoid injecting insulin if her Premeal readings are below 100 mg per DL.   -Otherwise, she will continue  Humalog  75/25  10 units with breakfast and 10 units with supper when pre-meal glucose is above 100 mg/dL . - She is warned not to inject insulin without proper monitoring of blood glucose. She is advised to continue monitoring her blood glucose 4 times a day-before meals and at bedtime.    -Patient is encouraged to call clinic for blood glucose levels less than 70 or above 300 mg /dl. -She is tolerating and benefiting from low-dose glipizide.  She is advised to continue glipizide 2.5 mg XL p.o. daily at breakfast. -Target numbers for A1c, LDL, HDL, Triglycerides, Waist Circumference were discussed in detail.  2) HTN:  Her blood pressure is not controlled to target.    She is advised to continue her current blood pressure medications including spironolactone 25 mg daily, losartan 50 mg p.o. daily.  Amlodipine 10 mg p.o. daily was added recently.    3. Lipids: Her most recent lipid panel showed LDL improving to 59 from 98.   She  She is advised to stay on omega-3 fatty acids.  4)  Weight/Diet: Her BMI is 23.8-not a candidate for weight loss.   CDE consult in progress, exercise, and carbs information provided.   4) Chronic Care:  -Patient is on ARB  medications and encouraged to continue to follow up with Ophthalmology, Podiatrist at least yearly or according to recommendations, and advised to stay away from smoking. I have recommended yearly flu vaccine and pneumonia vaccination at least every 5 years; and  sleep for at least 7 hours a day.   She is advised to maintain close follow-up with her PMD Dr. Tula Nakayama  - Time spent on this patient care encounter:  35 min, of which > 50% was spent in  counseling and the rest reviewing her blood glucose logs , discussing her hypoglycemia and hyperglycemia episodes, reviewing her current and  previous labs / studies  ( including abstraction from other facilities) and  medications  doses and developing a  long term treatment plan and documenting her care.    Please refer to Patient Instructions for Blood Glucose Monitoring and Insulin/Medications Dosing Guide"  in media tab for additional information. Please  also refer to " Patient Self Inventory" in the Media  tab for reviewed elements of pertinent patient history.  Deosha Hewitt Shorts participated in the discussions, expressed understanding, and voiced agreement with the above plans.  All questions were answered to her satisfaction. she is encouraged to contact clinic should she have any questions or concerns prior to her return visit.   Follow up plan: Return in about 4 months (around 06/26/2020) for F/U with Pre-visit Labs, Meter, Logs, A1c here.Glade Lloyd, MD Phone: [phone]  Fax: [phone]  This note was partially dictated with voice recognition software. Similar sounding words can be transcribed inadequately or may not  be corrected upon review.  02/25/2020, 3:34 PM

## 2020-03-24 ENCOUNTER — Encounter: Payer: Self-pay | Admitting: Family Medicine

## 2020-03-24 ENCOUNTER — Ambulatory Visit (INDEPENDENT_AMBULATORY_CARE_PROVIDER_SITE_OTHER): Payer: Medicare Other | Admitting: Family Medicine

## 2020-03-24 ENCOUNTER — Other Ambulatory Visit: Payer: Self-pay

## 2020-03-24 VITALS — BP 138/80 | HR 68 | Temp 97.4°F | Resp 18 | Ht 60.0 in | Wt 128.8 lb

## 2020-03-24 DIAGNOSIS — L989 Disorder of the skin and subcutaneous tissue, unspecified: Secondary | ICD-10-CM

## 2020-03-24 DIAGNOSIS — E1121 Type 2 diabetes mellitus with diabetic nephropathy: Secondary | ICD-10-CM

## 2020-03-24 DIAGNOSIS — I10 Essential (primary) hypertension: Secondary | ICD-10-CM | POA: Diagnosis not present

## 2020-03-24 DIAGNOSIS — Z794 Long term (current) use of insulin: Secondary | ICD-10-CM

## 2020-03-24 DIAGNOSIS — M810 Age-related osteoporosis without current pathological fracture: Secondary | ICD-10-CM

## 2020-03-24 DIAGNOSIS — N1831 Chronic kidney disease, stage 3a: Secondary | ICD-10-CM

## 2020-03-24 DIAGNOSIS — E782 Mixed hyperlipidemia: Secondary | ICD-10-CM

## 2020-03-24 MED ORDER — MUPIROCIN 2 % EX OINT: TOPICAL_OINTMENT | Freq: Two times a day (BID) | CUTANEOUS | 0 refills | Status: DC

## 2020-03-24 NOTE — Progress Notes (Addendum)
Subjective:  Patient ID: Emily Jennings, female    DOB: [birth year]  Age: 86 y.o. MRN: [id number]  CC:  Chief Complaint  Patient presents with  . Follow-up    follow up general check up does have sore on right leg they are hard to heal feels like she needs an atibiotic she has been putting diaper rash cream on it it does have sharp pains that runs through it       HPI  HPI  [name] is an 86 year old female patient of Dr. Griffin Dakin.  She presents today for checkup.  Only concern is a sore on the right leg. Reports taking medications without any issues.  Denies having trouble sleeping at times. Denies having trouble chewing or swallowing or appetite changes. Denies having changes in bladder or bowel habits, denies blood in urine or stool Denies Falls.  Reports some forgetfulness and skin concerns with a sore on Right leg. She does not recall and injury. She has been using diaper cream on it. She thinks it is due to her DM.   She denies chest pain, shortness of breath, cough, leg swelling or palpitations, fevers or chills, headaches or   Today patient denies signs and symptoms of COVID 19 infection including fever, chills, cough, shortness of breath, and headache. Past Medical, Surgical, Social History, Allergies, and Medications have been Reviewed.   Past Medical History:  Diagnosis Date  . Allergy   . Anxiety   . ANXIETY DISORDER, GENERALIZED 07/15/2007   Qualifier: Diagnosis of  By: Rennie Plowman    . Arthritis   . Cancer [facility]) 2009   breast, carcinoma in situ left  . Carcinoma in situ of breast 05/21/2008   Qualifier: Diagnosis of  By: Moshe Cipro MD, Margaret  Diagnosed in 2009, completed 5 year course of tamoxifen, no evidence of recurrence   . Carotid stenosis    11/16/2005  mild plaque formation and stenosis proximal right ECA  . Cataract   . Complication of anesthesia   . Coronary artery disease    cardiac catheterization on 03/20/2006  LAD mid 40% stenosis, left  circumflex mild 40% stenosis, RCA mid-vessel 40% to 50% lesion   EF 60%  . Diabetes mellitus   . GERD (gastroesophageal reflux disease)   . Hernia, inguinal    left  . Hyperglycemia   . Hypertension   . Insomnia 11/16/2011  . Low blood potassium   . Non-insulin dependent type 2 diabetes mellitus ([place])   . Osteoporosis   . Shortness of breath    2D Echocardiogram 01/26/2009   EF of greater than 55%, mild MR, mild TR, normal ventricular function  . Thickened endometrium 10/26/2017   Noted by gyne in 2017, missed 6 month follow up, referred in 09/2017  . Ventricular tachycardia, non-sustained (HCC)    developed during stress test 02/08/2006, spontaneously aborted, mild reversible apical defect    Current Meds  Medication Sig  . acetaminophen (TYLENOL) 500 MG tablet Take one tablet two times daily for 3 days, then as needed  . alendronate (FOSAMAX) 70 MG tablet TAKE 1 TABLET BY MOUTH EVERY 7 DAYS. TAKE WITH A FULL GLASS OF WATER ON AN EMPTY STOMACH  . amLODipine (NORVASC) 10 MG tablet TAKE 1 TABLET BY MOUTH EVERY DAY  . aspirin EC 81 MG tablet Take 81 mg by mouth daily.  . blood glucose meter kit and supplies KIT One touch Ultra. Use up to two times daily as directed. (FOR ICD E11.65)  .  cholecalciferol (VITAMIN D3) 25 MCG (1000 UT) tablet Take 1,000 Units by mouth daily.  Marland Kitchen glipiZIDE (GLUCOTROL XL) 2.5 MG 24 hr tablet TAKE 1 TABLET (2.5 MG TOTAL) BY MOUTH DAILY WITH BREAKFAST.  Marland Kitchen HUMALOG MIX 75/25 (75-25) 100 UNIT/ML SUSP injection INJECT 10 UNITS INTO THE SKIN TWICE DAILY.  Marland Kitchen Insulin Pen Needle 32G X 4 MM MISC 1 each by Does not apply route as directed.  . Insulin Syringe-Needle U-100 (BD INSULIN SYRINGE U/F) 31G X 5/16" 0.5 ML MISC USE AS DIRECTED TWICE DAILY  . KLOR-CON M20 20 MEQ tablet TAKE 1 TABLET BY MOUTH TWICE A DAY  . losartan (COZAAR) 50 MG tablet Take 1 tablet (50 mg total) by mouth daily.  . metoprolol tartrate (LOPRESSOR) 50 MG tablet TAKE 1 TABLET BY MOUTH TWICE A DAY  .  omega-3 acid ethyl esters (LOVAZA) 1 g capsule Take by mouth 2 (two) times daily.  Glory Rosebush Delica Lancets 59F MISC USE AS DIRECTED TO CHECK BLOOD GLUCOSE FOUR TIMES DAILY  . ONETOUCH ULTRA test strip USE TO TEST BLOOD SUGAR 4 TIMES A DAY (E11.65)  . rosuvastatin (CRESTOR) 5 MG tablet TAKE 1 TABLET BY MOUTH EVERYDAY AT BEDTIME  . spironolactone (ALDACTONE) 25 MG tablet Take 1 tablet (25 mg total) by mouth daily.    ROS:  Review of Systems  Constitutional: Negative.   HENT: Negative.   Eyes: Negative.   Respiratory: Negative.   Cardiovascular: Negative.   Gastrointestinal: Negative.   Genitourinary: Negative.   Musculoskeletal: Negative.   Skin: Negative.   Neurological: Negative.   Endo/Heme/Allergies: Negative.   Psychiatric/Behavioral: Negative.   All other systems reviewed and are negative.    Objective:   Today's Vitals: BP 138/80 (BP Location: Right Arm, Patient Position: Sitting, Cuff Size: Normal)   Pulse 68   Temp (!) 97.4 F (36.3 C) (Temporal)   Resp 18   Ht 5' (1.524 m)   Wt 128 lb 12.8 oz (58.4 kg)   SpO2 99%   BMI 25.15 kg/m  Vitals with BMI 03/24/2020 02/25/2020 11/05/2019  Height 5' 0" 5' 1" 5' 1"  Weight 128 lbs 13 oz 126 lbs 3 oz 123 lbs  BMI 25.15 63.84 66.59  Systolic 935 701 779  Diastolic 80 74 70  Pulse 68 65 62     Physical Exam Vitals and nursing note reviewed.  Constitutional:      Appearance: Normal appearance. She is well-developed, well-groomed and overweight.  HENT:     Head: Normocephalic and atraumatic.     Right Ear: External ear normal.     Left Ear: External ear normal.     Mouth/Throat:     Comments: Mask in place  Eyes:     General:        Right eye: No discharge.        Left eye: No discharge.     Conjunctiva/sclera: Conjunctivae normal.  Cardiovascular:     Rate and Rhythm: Normal rate and regular rhythm.     Pulses: Normal pulses.     Heart sounds: Normal heart sounds.  Pulmonary:     Effort: Pulmonary effort is  normal.     Breath sounds: Normal breath sounds.  Musculoskeletal:        General: Normal range of motion.     Cervical back: Normal range of motion and neck supple.  Skin:    General: Skin is warm.     Findings: Wound present.     Comments: 3-5 mm sore on  right shin. Mild erythema around it. No swelling, no induration   Neurological:     General: No focal deficit present.     Mental Status: She is alert and oriented to person, place, and time.  Psychiatric:        Attention and Perception: Attention normal.        Mood and Affect: Mood normal.        Speech: Speech normal.        Behavior: Behavior normal. Behavior is cooperative.        Thought Content: Thought content normal.        Cognition and Memory: Cognition normal.        Judgment: Judgment normal.       Assessment   1. Type 2 diabetes mellitus with stage 3a chronic kidney disease, with long-term current use of insulin ([place])   2. Essential hypertension   3. Osteoporosis, unspecified osteoporosis type, unspecified pathological fracture presence   4. Mixed hyperlipidemia   5. Stage 3a chronic kidney disease   6. Leg sore     Tests ordered Orders Placed This Encounter  Procedures  . CBC  . COMPLETE METABOLIC PANEL WITH GFR     Plan: Please see assessment and plan per problem list above.   Meds ordered this encounter  Medications  . mupirocin ointment (BACTROBAN) 2 %    Sig: Apply topically 2 (two) times daily for 7 days.    Dispense:  22 g    Refill:  0    Order Specific Question:   Supervising Provider    Answer:   [name] [[id number]]    Patient to follow-up in 4-5 months .  Perlie Mayo, NP

## 2020-03-24 NOTE — Patient Instructions (Addendum)
I appreciate the opportunity to provide you with care for your health and wellness. Today we discussed: overall health   Follow up: 4-5 months  Labs today  Referrals today  Use cream on sore on leg, call in week if not getting better.  Please continue to practice social distancing to keep you, your family, and our community safe.  If you must go out, please wear a mask and practice good handwashing.  It was a pleasure to see you and I look forward to continuing to work together on your health and well-being. Please do not hesitate to call the office if you need care or have questions about your care.  Have a wonderful day and week. With Gratitude, Cherly Beach, DNP, AGNP-BC

## 2020-03-24 NOTE — Assessment & Plan Note (Signed)
Bactroban ordered topically, if not improved in a week or worse we will consider oral antibiotics

## 2020-03-24 NOTE — Assessment & Plan Note (Signed)
On Fosamax, continue this and weight bearing exercises

## 2020-03-24 NOTE — Assessment & Plan Note (Signed)
[name] is encouraged to maintain a well balanced diet that is low in salt. Controlled, continue current medication regimen.  Additionally, she is also reminded that exercise is beneficial for heart health and control of  Blood pressure. 30-60 minutes daily is recommended-walking was suggested.

## 2020-03-24 NOTE — Assessment & Plan Note (Signed)
Updated labs are being checked

## 2020-03-24 NOTE — Assessment & Plan Note (Signed)
Emily Jennings is encouraged to check blood sugar daily as directed. Continue current medications. Is on statin as well. Educated on importance of maintain a well balanced diabetic friendly diet. She is reminded the importance of maintaining  good blood sugars,  taking medications as directed, daily foot care, annual eye exams. Additionally educated about keeping good control over blood pressure and cholesterol as well.

## 2020-03-24 NOTE — Assessment & Plan Note (Signed)
Will need updated labs at next appt Heart healthy diet encouraged

## 2020-03-25 LAB — COMPLETE METABOLIC PANEL WITH GFR
AG Ratio: 1.8 (calc) (ref 1.0–2.5)
ALT: 17 U/L (ref 6–29)
AST: 27 U/L (ref 10–35)
Albumin: 3.9 g/dL (ref 3.6–5.1)
Alkaline phosphatase (APISO): 80 U/L (ref 37–153)
BUN/Creatinine Ratio: 26 (calc) — ABNORMAL HIGH (ref 6–22)
BUN: 25 mg/dL (ref 7–25)
CO2: 32 mmol/L (ref 20–32)
Calcium: 9.4 mg/dL (ref 8.6–10.4)
Chloride: 100 mmol/L (ref 98–110)
Creat: 0.97 mg/dL — ABNORMAL HIGH (ref 0.60–0.88)
GFR, Est African American: 62 mL/min/{1.73_m2} (ref >=60)
GFR, Est Non African American: 54 mL/min/{1.73_m2} — ABNORMAL LOW (ref >=60)
Globulin: 2.2 g/dL (calc) (ref 1.9–3.7)
Glucose, Bld: 85 mg/dL (ref 65–139)
Potassium: 4.2 mmol/L (ref 3.5–5.3)
Sodium: 137 mmol/L (ref 135–146)
Total Bilirubin: 0.4 mg/dL (ref 0.2–1.2)
Total Protein: 6.1 g/dL (ref 6.1–8.1)

## 2020-03-25 LAB — CBC
HCT: 39.3 % (ref 35.0–45.0)
Hemoglobin: 12.8 g/dL (ref 11.7–15.5)
MCH: 30.3 pg (ref 27.0–33.0)
MCHC: 32.6 g/dL (ref 32.0–36.0)
MCV: 92.9 fL (ref 80.0–100.0)
MPV: 10.5 fL (ref 7.5–12.5)
Platelets: 239 10*3/uL (ref 140–400)
RBC: 4.23 10*6/uL (ref 3.80–5.10)
RDW: 12.5 % (ref 11.0–15.0)
WBC: 5.9 10*3/uL (ref 3.8–10.8)

## 2020-03-29 ENCOUNTER — Ambulatory Visit: Payer: Medicare Other | Admitting: Family Medicine

## 2020-03-30 ENCOUNTER — Other Ambulatory Visit: Payer: Self-pay

## 2020-03-30 DIAGNOSIS — Z794 Long term (current) use of insulin: Secondary | ICD-10-CM

## 2020-03-30 MED ORDER — GLIPIZIDE ER 2.5 MG PO TB24: 2.5000 mg | ORAL_TABLET | Freq: Every day | ORAL | 1 refills | Status: DC

## 2020-03-30 MED ORDER — ONETOUCH DELICA LANCETS 33G MISC: 3 refills | Status: DC

## 2020-03-30 MED ORDER — ONETOUCH ULTRA VI STRP: ORAL_STRIP | 3 refills | Status: DC

## 2020-03-30 MED ORDER — POTASSIUM CHLORIDE CRYS ER 20 MEQ PO TBCR: 20.0000 meq | EXTENDED_RELEASE_TABLET | Freq: Two times a day (BID) | ORAL | 1 refills | Status: DC

## 2020-03-31 ENCOUNTER — Other Ambulatory Visit: Payer: Self-pay

## 2020-03-31 DIAGNOSIS — I10 Essential (primary) hypertension: Secondary | ICD-10-CM

## 2020-03-31 MED ORDER — METOPROLOL TARTRATE 50 MG PO TABS: 50.0000 mg | ORAL_TABLET | Freq: Two times a day (BID) | ORAL | 1 refills | Status: DC

## 2020-03-31 MED ORDER — AMLODIPINE BESYLATE 10 MG PO TABS: 10.0000 mg | ORAL_TABLET | Freq: Every day | ORAL | 1 refills | Status: DC

## 2020-03-31 MED ORDER — LOSARTAN POTASSIUM 50 MG PO TABS: 50.0000 mg | ORAL_TABLET | Freq: Every day | ORAL | 1 refills | Status: DC

## 2020-03-31 MED ORDER — ALENDRONATE SODIUM 70 MG PO TABS: ORAL_TABLET | ORAL | 1 refills | Status: DC

## 2020-03-31 MED ORDER — POTASSIUM CHLORIDE CRYS ER 20 MEQ PO TBCR: 20.0000 meq | EXTENDED_RELEASE_TABLET | Freq: Two times a day (BID) | ORAL | 1 refills | Status: DC

## 2020-03-31 MED ORDER — SPIRONOLACTONE 25 MG PO TABS: 25.0000 mg | ORAL_TABLET | Freq: Every day | ORAL | 1 refills | Status: DC

## 2020-04-05 ENCOUNTER — Other Ambulatory Visit: Payer: Self-pay | Admitting: "Endocrinology

## 2020-04-05 DIAGNOSIS — E1122 Type 2 diabetes mellitus with diabetic chronic kidney disease: Secondary | ICD-10-CM

## 2020-04-05 DIAGNOSIS — Z794 Long term (current) use of insulin: Secondary | ICD-10-CM

## 2020-04-21 ENCOUNTER — Other Ambulatory Visit: Payer: Self-pay

## 2020-04-21 DIAGNOSIS — Z794 Long term (current) use of insulin: Secondary | ICD-10-CM

## 2020-04-21 MED ORDER — HUMALOG MIX 75/25 (75-25) 100 UNIT/ML ~~LOC~~ SUSP: SUBCUTANEOUS | 2 refills | Status: DC

## 2020-05-14 ENCOUNTER — Telehealth: Payer: Self-pay

## 2020-05-14 NOTE — Telephone Encounter (Signed)
Pt lvm that she needs a call regarding bill

## 2020-05-20 ENCOUNTER — Other Ambulatory Visit: Payer: Self-pay | Admitting: Family Medicine

## 2020-05-20 DIAGNOSIS — I10 Essential (primary) hypertension: Secondary | ICD-10-CM

## 2020-05-23 ENCOUNTER — Other Ambulatory Visit: Payer: Self-pay | Admitting: Family Medicine

## 2020-05-26 NOTE — Telephone Encounter (Signed)
Spoke with pt, and called [employer] for the pt to address the issues with claiim and provider Dr Moshe Cipro not being in network

## 2020-05-27 ENCOUNTER — Other Ambulatory Visit: Payer: Self-pay

## 2020-05-27 ENCOUNTER — Ambulatory Visit (INDEPENDENT_AMBULATORY_CARE_PROVIDER_SITE_OTHER): Payer: Medicare Other

## 2020-05-27 DIAGNOSIS — Z23 Encounter for immunization: Secondary | ICD-10-CM | POA: Diagnosis not present

## 2020-05-27 NOTE — Telephone Encounter (Signed)
Pt brung in the billing, and the billl is for Dr Dorris Fetch, I advised pt that her specialist copay is $35.00 and the bill was correct.

## 2020-06-23 DIAGNOSIS — Z961 Presence of intraocular lens: Secondary | ICD-10-CM | POA: Diagnosis not present

## 2020-06-23 DIAGNOSIS — H2511 Age-related nuclear cataract, right eye: Secondary | ICD-10-CM | POA: Diagnosis not present

## 2020-06-23 DIAGNOSIS — H40023 Open angle with borderline findings, high risk, bilateral: Secondary | ICD-10-CM | POA: Diagnosis not present

## 2020-06-28 ENCOUNTER — Ambulatory Visit: Payer: Medicare Other | Admitting: "Endocrinology

## 2020-06-29 DIAGNOSIS — N1831 Chronic kidney disease, stage 3a: Secondary | ICD-10-CM | POA: Diagnosis not present

## 2020-06-29 DIAGNOSIS — E1121 Type 2 diabetes mellitus with diabetic nephropathy: Secondary | ICD-10-CM | POA: Diagnosis not present

## 2020-06-29 DIAGNOSIS — Z794 Long term (current) use of insulin: Secondary | ICD-10-CM | POA: Diagnosis not present

## 2020-06-30 ENCOUNTER — Encounter: Payer: Self-pay | Admitting: Nurse Practitioner

## 2020-06-30 ENCOUNTER — Ambulatory Visit (INDEPENDENT_AMBULATORY_CARE_PROVIDER_SITE_OTHER): Payer: Medicare Other | Admitting: Nurse Practitioner

## 2020-06-30 ENCOUNTER — Other Ambulatory Visit: Payer: Self-pay

## 2020-06-30 VITALS — BP 165/77 | HR 64 | Ht 60.0 in | Wt 132.6 lb

## 2020-06-30 DIAGNOSIS — E1122 Type 2 diabetes mellitus with diabetic chronic kidney disease: Secondary | ICD-10-CM

## 2020-06-30 DIAGNOSIS — N1831 Chronic kidney disease, stage 3a: Secondary | ICD-10-CM | POA: Diagnosis not present

## 2020-06-30 DIAGNOSIS — I10 Essential (primary) hypertension: Secondary | ICD-10-CM

## 2020-06-30 DIAGNOSIS — Z794 Long term (current) use of insulin: Secondary | ICD-10-CM

## 2020-06-30 DIAGNOSIS — E782 Mixed hyperlipidemia: Secondary | ICD-10-CM | POA: Diagnosis not present

## 2020-06-30 LAB — COMPLETE METABOLIC PANEL WITH GFR
AG Ratio: 1.7 (calc) (ref 1.0–2.5)
ALT: 16 U/L (ref 6–29)
AST: 26 U/L (ref 10–35)
Albumin: 3.9 g/dL (ref 3.6–5.1)
Alkaline phosphatase (APISO): 68 U/L (ref 37–153)
BUN/Creatinine Ratio: 22 (calc) (ref 6–22)
BUN: 27 mg/dL — ABNORMAL HIGH (ref 7–25)
CO2: 30 mmol/L (ref 20–32)
Calcium: 9.1 mg/dL (ref 8.6–10.4)
Chloride: 105 mmol/L (ref 98–110)
Creat: 1.24 mg/dL — ABNORMAL HIGH (ref 0.60–0.88)
GFR, Est African American: 46 mL/min/{1.73_m2} — ABNORMAL LOW (ref >=60)
GFR, Est Non African American: 40 mL/min/{1.73_m2} — ABNORMAL LOW (ref >=60)
Globulin: 2.3 g/dL (calc) (ref 1.9–3.7)
Glucose, Bld: 126 mg/dL — ABNORMAL HIGH (ref 65–99)
Potassium: 4.1 mmol/L (ref 3.5–5.3)
Sodium: 142 mmol/L (ref 135–146)
Total Bilirubin: 0.5 mg/dL (ref 0.2–1.2)
Total Protein: 6.2 g/dL (ref 6.1–8.1)

## 2020-06-30 LAB — T4, FREE: Free T4: 1.1 ng/dL (ref 0.8–1.8)

## 2020-06-30 LAB — TSH: TSH: 1.29 mIU/L (ref 0.40–4.50)

## 2020-06-30 LAB — POCT GLYCOSYLATED HEMOGLOBIN (HGB A1C): Hemoglobin A1C: 7.2 % — AB (ref 4.0–5.6)

## 2020-06-30 LAB — LIPID PANEL
Cholesterol: 207 mg/dL — ABNORMAL HIGH (ref <200)
HDL: 115 mg/dL (ref >=50)
LDL Cholesterol (Calc): 80 mg/dL (calc)
Non-HDL Cholesterol (Calc): 92 mg/dL (calc) (ref <130)
Total CHOL/HDL Ratio: 1.8 (calc) (ref <5.0)
Triglycerides: 50 mg/dL (ref <150)

## 2020-06-30 MED ORDER — GLUCOSE BLOOD VI STRP: ORAL_STRIP | 12 refills | Status: DC

## 2020-06-30 NOTE — Progress Notes (Signed)
06/30/2020  Endocrinology follow-up note   Subjective:    Patient ID: Emily Jennings, female    DOB: June 06, 1934,    Past Medical History:  Diagnosis Date  . Allergy   . Anxiety   . ANXIETY DISORDER, GENERALIZED 07/15/2007   Qualifier: Diagnosis of  By: Rennie Plowman    . Arthritis   . Cancer [facility]) 2009   breast, carcinoma in situ left  . Carcinoma in situ of breast 05/21/2008   Qualifier: Diagnosis of  By: Moshe Cipro MD, Margaret  Diagnosed in 2009, completed 5 year course of tamoxifen, no evidence of recurrence   . Carotid stenosis    11/16/2005  mild plaque formation and stenosis proximal right ECA  . Cataract   . Complication of anesthesia   . Coronary artery disease    cardiac catheterization on 03/20/2006  LAD mid 40% stenosis, left circumflex mild 40% stenosis, RCA mid-vessel 40% to 50% lesion   EF 60%  . Diabetes mellitus   . GERD (gastroesophageal reflux disease)   . Hernia, inguinal    left  . Hyperglycemia   . Hypertension   . Insomnia 11/16/2011  . Low blood potassium   . Non-insulin dependent type 2 diabetes mellitus ([facility])   . Osteoporosis   . Shortness of breath    2D Echocardiogram 01/26/2009   EF of greater than 55%, mild MR, mild TR, normal ventricular function  . Thickened endometrium 10/26/2017   Noted by gyne in 2017, missed 6 month follow up, referred in 09/2017  . Ventricular tachycardia, non-sustained ([facility])    developed during stress test 02/08/2006, spontaneously aborted, mild reversible apical defect   Past Surgical History:  Procedure Laterality Date  . BREAST LUMPECTOMY Left 2009   Left breast 2009  . CATARACT EXTRACTION W/PHACO Left 10/28/2014   Procedure: PHACO EMULSION CATARACT EXTRACTION WITH INTRAOCULAR LENS IMPLANT LEFT EYE (IOC);  Surgeon: Marylynn Pearson, MD;  Location: [facility];  Service: Ophthalmology;  Laterality: Left;  . COLONOSCOPY    . cyst removed from left foot     Social History   Socioeconomic History  . Marital status: Married     Spouse name: Not on file  . Number of children: 2  . Years of education: na  . Highest education level: Some college, no degree  Occupational History  . Occupation: retired    Fish farm manager: retired  Tobacco Use  . Smoking status: Never Smoker  . Smokeless tobacco: Never Used  Vaping Use  . Vaping Use: Never used  Substance and Sexual Activity  . Alcohol use: No  . Drug use: No  . Sexual activity: Not Currently    Birth control/protection: Post-menopausal  Other Topics Concern  . Not on file  Social History Narrative   Lives with her husband and son and attends yoga at the [facility] 2 days a week and speaks to her grandson nightly on the phone   Social Determinants of Health   Financial Resource Strain:   . Difficulty of Paying Living Expenses: Not on file  Food Insecurity:   . Worried About Charity fundraiser in the Last Year: Not on file  . Ran Out of Food in the Last Year: Not on file  Transportation Needs:   . Lack of Transportation (Medical): Not on file  . Lack of Transportation (Non-Medical): Not on file  Physical Activity:   . Days of Exercise per Week: Not on file  . Minutes of Exercise per Session: Not on file  Stress:   .  Feeling of Stress : Not on file  Social Connections:   . Frequency of Communication with Friends and Family: Not on file  . Frequency of Social Gatherings with Friends and Family: Not on file  . Attends Religious Services: Not on file  . Active Member of Clubs or Organizations: Not on file  . Attends Archivist Meetings: Not on file  . Marital Status: Not on file   Outpatient Encounter Medications as of 06/30/2020  Medication Sig  . acetaminophen (TYLENOL) 500 MG tablet Take one tablet two times daily for 3 days, then as needed  . alendronate (FOSAMAX) 70 MG tablet TAKE 1 TABLET BY MOUTH EVERY 7 DAYS. TAKE WITH A FULL GLASS OF WATER ON AN EMPTY STOMACH  . amLODipine (NORVASC) 10 MG tablet TAKE 1 TABLET BY MOUTH EVERY DAY  . aspirin EC 81  MG tablet Take 81 mg by mouth daily.  . blood glucose meter kit and supplies KIT One touch Ultra. Use up to two times daily as directed. (FOR ICD E11.65)  . cholecalciferol (VITAMIN D3) 25 MCG (1000 UT) tablet Take 1,000 Units by mouth daily.  . insulin lispro protamine-lispro (HUMALOG MIX 75/25) (75-25) 100 UNIT/ML SUSP injection INJECT 10 UNITS INTO THE SKIN TWICE DAILY.  Marland Kitchen Insulin Pen Needle 32G X 4 MM MISC 1 each by Does not apply route as directed.  . Insulin Syringe-Needle U-100 (BD INSULIN SYRINGE U/F) 31G X 5/16" 0.5 ML MISC USE AS DIRECTED TWICE DAILY  . losartan (COZAAR) 50 MG tablet TAKE 1 TABLET BY MOUTH EVERY DAY *STOP LOSARTAN-HCTZ  . metoprolol tartrate (LOPRESSOR) 50 MG tablet Take 1 tablet (50 mg total) by mouth 2 (two) times daily.  Marland Kitchen omega-3 acid ethyl esters (LOVAZA) 1 g capsule Take by mouth 2 (two) times daily.  Emily Jennings 84X MISC USE AS DIRECTED TO CHECK BLOOD GLUCOSE FOUR TIMES DAILY  . potassium chloride SA (KLOR-CON M20) 20 MEQ tablet Take 1 tablet (20 mEq total) by mouth 2 (two) times daily.  . rosuvastatin (CRESTOR) 5 MG tablet TAKE 1 TABLET BY MOUTH EVERYDAY AT BEDTIME  . spironolactone (ALDACTONE) 25 MG tablet Take 1 tablet (25 mg total) by mouth daily.  . [DISCONTINUED] glipiZIDE (GLUCOTROL XL) 2.5 MG 24 hr tablet TAKE 1 TABLET (2.5 MG TOTAL) BY MOUTH DAILY WITH BREAKFAST.  . [DISCONTINUED] glucose blood (ONETOUCH ULTRA) test strip USE TO TEST BLOOD SUGAR 4 TIMES A DAY (E11.65)  . glucose blood test strip Use as instructed to monitor blood glucose 3 times daily, before breakfast, before supper, before bed.  . RESTASIS 0.05 % ophthalmic emulsion    No facility-administered encounter medications on file as of 06/30/2020.   ALLERGIES: Allergies  Allergen Reactions  . Benadryl [Diphenhydramine Hcl] Hypertension  . Citalopram   . Metformin And Related Diarrhea  . Tramadol Other (See Comments)    Felt light headed and dizzy  . Tylenol  [Acetaminophen] Other (See Comments)    Patient stated tylenol makes her "feel high"   VACCINATION STATUS: Immunization History  Administered Date(s) Administered  . Fluad Quad(high Dose 65+) 06/02/2019, 05/27/2020  . Influenza Split 07/17/2011  . Influenza Whole 06/18/2007, 07/29/2009, 05/16/2013  . Influenza, High Dose Seasonal PF 08/13/2018  . Influenza,inj,Quad PF,6+ Mos 05/19/2014, 08/22/2016, 05/17/2017  . Moderna SARS-COVID-2 Vaccination 10/26/2019, 11/26/2019  . Pneumococcal Conjugate-13 03/24/2014  . Pneumococcal Polysaccharide-23 05/20/2008  . Td 06/18/2002  . Tdap 07/17/2011    Diabetes She presents for her follow-up diabetic visit. She has type  2 diabetes mellitus. Onset time: She was diagnosed at approximate age of 54 years. Her disease course has been stable. There are no hypoglycemic associated symptoms. Pertinent negatives for hypoglycemia include no confusion, headaches, pallor or seizures. There are no diabetic associated symptoms. Pertinent negatives for diabetes include no chest pain, no fatigue, no polydipsia, no polyphagia and no polyuria. There are no hypoglycemic complications. Symptoms are stable. Diabetic complications include heart disease and nephropathy. Risk factors for coronary artery disease include diabetes mellitus, dyslipidemia, hypertension, sedentary lifestyle and post-menopausal. Current diabetic treatment includes insulin injections and oral agent (monotherapy). She is compliant with treatment most of the time. Her weight is fluctuating minimally. She is following a generally unhealthy diet. When asked about meal planning, she reported none. She has had a previous visit with a dietitian. She never participates in exercise. Her home blood glucose trend is fluctuating dramatically. Her breakfast blood glucose range is generally 130-140 mg/dl. Her dinner blood glucose range is generally 180-200 mg/dl. Her bedtime blood glucose range is generally 70-90 mg/dl.  (She presents today with her meter and logs showing near target fasting glycemic profile and tightening postprandial glycemic profile.  She has frequent night time hypoglycemia, associated with inadequate dinner intake.  Her POCT A1C today is 7.2%, unchanged from last visit.) An ACE inhibitor/angiotensin II receptor blocker is being taken. She does not see a podiatrist.Eye exam is current.  Hyperlipidemia This is a chronic problem. The current episode started more than 1 year ago. The problem is controlled. Recent lipid tests were reviewed and are normal. Exacerbating diseases include chronic renal disease and diabetes. Factors aggravating her hyperlipidemia include beta blockers and fatty foods. Pertinent negatives include no chest pain, myalgias or shortness of breath. Current antihyperlipidemic treatment includes statins. The current treatment provides moderate improvement of lipids. Compliance problems include adherence to diet and adherence to exercise.  Risk factors for coronary artery disease include diabetes mellitus, dyslipidemia, a sedentary lifestyle, hypertension and post-menopausal.  Hypertension This is a chronic problem. The current episode started more than 1 year ago. The problem has been gradually improving since onset. The problem is controlled. Pertinent negatives include no chest pain, headaches, palpitations or shortness of breath. Risk factors for coronary artery disease include diabetes mellitus, dyslipidemia, sedentary lifestyle and post-menopausal state. Past treatments include angiotensin blockers, calcium channel blockers, beta blockers and diuretics. The current treatment provides moderate improvement. There are no compliance problems.  Hypertensive end-organ damage includes kidney disease and CAD/MI. Identifiable causes of hypertension include chronic renal disease.    Review of systems  Constitutional: + Minimally fluctuating body weight,  current Body mass index is 25.9 kg/m.  , no fatigue, no subjective hyperthermia, no subjective hypothermia Eyes: no blurry vision, no xerophthalmia ENT: no sore throat, no nodules palpated in throat, no dysphagia/odynophagia, no hoarseness Cardiovascular: no chest pain, no shortness of breath, no palpitations, no leg swelling Respiratory: no cough, no shortness of breath Gastrointestinal: no nausea/vomiting/diarrhea Musculoskeletal: no muscle/joint aches Skin: no rashes, no hyperemia Neurological: no tremors, no numbness, no tingling, no dizziness Psychiatric: no depression, no anxiety   Objective:    BP (!) 165/77 (BP Location: Left Arm)   Pulse 64   Ht 5' (1.524 m)   Wt 132 lb 9.6 oz (60.1 kg)   BMI 25.90 kg/m   Wt Readings from Last 3 Encounters:  06/30/20 132 lb 9.6 oz (60.1 kg)  03/24/20 128 lb 12.8 oz (58.4 kg)  02/25/20 126 lb 3.2 oz (57.2 kg)    BP  Readings from Last 3 Encounters:  06/30/20 (!) 165/77  03/24/20 138/80  02/25/20 (!) 173/74    Physical Exam- Limited  Constitutional:  Body mass index is 25.9 kg/m. , not in acute distress, normal state of mind, forgetful at times Eyes:  EOMI, no exophthalmos Neck: Supple Thyroid: No gross goiter Cardiovascular: RRR, no murmers, rubs, or gallops, no edema Respiratory: Adequate breathing efforts, no crackles, rales, rhonchi, or wheezing Musculoskeletal: + heberden's nodes in bilateral fingers, strength intact in all four extremities, no gross restriction of joint movements Skin:  no rashes, no hyperemia Neurological: no tremor with outstretched hands  POCT ABI Results 06/30/20   Right ABI:  1.23      Left ABI:  1.13  Right leg systolic / diastolic: 220/25 mmHg Left leg systolic / diastolic: 427/06 mmHg  Arm systolic / diastolic: 237/62 mmHG  Detailed report will be scanned into patient chart.   Results for orders placed or performed in visit on 06/30/20  HgB A1c  Result Value Ref Range   Hemoglobin A1C 7.2 (A) 4.0 - 5.6 %   HbA1c POC (<>  result, manual entry)     HbA1c, POC (prediabetic range)     HbA1c, POC (controlled diabetic range)     Complete Blood Count (Most recent): Lab Results  Component Value Date   WBC 5.9 03/24/2020   HGB 12.8 03/24/2020   HCT 39.3 03/24/2020   MCV 92.9 03/24/2020   PLT 239 03/24/2020   Chemistry (most recent): Lab Results  Component Value Date   NA 142 06/29/2020   K 4.1 06/29/2020   CL 105 06/29/2020   CO2 30 06/29/2020   BUN 27 (H) 06/29/2020   CREATININE 1.24 (H) 06/29/2020   Diabetic Labs (most recent): Lab Results  Component Value Date   HGBA1C 7.2 (A) 06/30/2020   HGBA1C 7.2 (A) 02/25/2020   HGBA1C 7.7 (A) 10/16/2019   Lipid Panel     Component Value Date/Time   CHOL 207 (H) 06/29/2020 0916   TRIG 50 06/29/2020 0916   HDL 115 06/29/2020 0916   CHOLHDL 1.8 06/29/2020 0916   VLDL 8 10/31/2016 0845   LDLCALC 80 06/29/2020 0916     Assessment & Plan:   1. Type 2 diabetes mellitus with stage 3 chronic kidney disease, with long-term current use of insulin   She presents today with her meter and logs showing near target fasting glycemic profile and tightening postprandial glycemic profile.  She has frequent night time hypoglycemia, associated with inadequate dinner intake.  Her POCT A1C today is 7.2%, unchanged from last visit.     Her recent labs were reviewed with her.    Patient remains at a high risk for more acute and chronic complications of diabetes which include CAD, CVA, CKD, retinopathy, and neuropathy. These are all discussed in detail with the patient.  - Nutritional counseling repeated at each appointment due to patients tendency to fall back in to old habits.  - The patient admits there is a room for improvement in their diet and drink choices. -  Suggestion is made for the patient to avoid simple carbohydrates from their diet including Cakes, Sweet Desserts / Pastries, Ice Cream, Soda (diet and regular), Sweet Tea, Candies, Chips, Cookies, Sweet  Pastries,  Store Bought Juices, Alcohol in Excess of  1-2 drinks a day, Artificial Sweeteners, Coffee Creamer, and "Sugar-free" Products. This will help patient to have stable blood glucose profile and potentially avoid unintended weight gain.   - I encouraged the  patient to switch to  unprocessed or minimally processed complex starch and increased protein intake (animal or plant source), fruits, and vegetables.   - Patient is advised to stick to a routine mealtimes to eat 3 meals  a day and avoid unnecessary snacks ( to snack only to correct hypoglycemia).  I have approached patient to continue on  intensive monitoring of blood glucose and insulin therapy, and patient agrees.   - She continued to do fairly well on a simplified treatment with premixed insulin. Avoiding hypoglycemia is the #1 priority in her case.   -Based on her frequent postprandial hypoglycemia, will discontinue Glipizide today. -She is advised to continue Humalog 75/25 10 units with breakfast and 10 units with supper, if her glucose is above 90 and she is eating.  -She is encouraged to continue monitoring blood glucose at least 3 times per day, before injecting insulin at breakfast and supper, and before bed.  She is instructed to call the clinic if she has glucose readings less than 70 or greater than 200 for 3 tests in a row.  (provided her with new sample meter from office as hers was getting outdated).  - She is warned not to inject insulin without proper monitoring of blood glucose.  -Target numbers for A1c, LDL, HDL, Triglycerides, Waist Circumference were discussed in detail.  2) HTN:  Her blood pressure is not controlled to target.  She is advised to continue Amlodipine 10 mg po daily, Losartan 50 mg po daily, Metoprolol 50 mg po twice daily, and Spironolactone 25 mg po daily.  Will consider changing dose of meds on subsequent visit if BP remains elevated.  3) Lipids:  Her most recent lipid panel from 06/29/20 shows  controlled LDL of 80.  She is advised to continue Crestor 5 mg po daily at bedtime and Lovaza 1 gm po twice daily.  Side effects and precautions discussed with patient.  4)  Weight/Diet:  Her Body mass index is 25.9 kg/m.-not a candidate for weight loss.  CDE consult in progress, exercise, and carbs information provided.   4) Chronic Care: -Patient is on ARB medications and encouraged to continue to follow up with Ophthalmology, Podiatrist at least yearly or according to recommendations, and advised to stay away from smoking. I have recommended yearly flu vaccine and pneumonia vaccination at least every 5 years; and  sleep for at least 7 hours a day.   She is advised to maintain close follow-up with her PCP:  [name], MD  - Time spent on this patient care encounter:  35 min, of which > 50% was spent in  counseling and the rest reviewing her blood glucose logs , discussing her hypoglycemia and hyperglycemia episodes, reviewing her current and  previous labs / studies  ( including abstraction from other facilities) and medications  doses and developing a  long term treatment plan and documenting her care.   Please refer to Patient Instructions for Blood Glucose Monitoring and Insulin/Medications Dosing Guide"  in media tab for additional information. Please  also refer to " Patient Self Inventory" in the Media  tab for reviewed elements of pertinent patient history.  Breslyn Hewitt Shorts participated in the discussions, expressed understanding, and voiced agreement with the above plans.  All questions were answered to her satisfaction. she is encouraged to contact clinic should she have any questions or concerns prior to her return visit.  Follow up plan: Return in about 4 months (around 10/31/2020) for Diabetes follow up- A1c  and urine micro in office, No previsit labs, Bring glucometer and logs.  Rayetta Pigg, [facility] [address] [place],  [place] [address] Phone: [phone] Fax: [phone]  06/30/2020, 3:54 PM

## 2020-06-30 NOTE — Patient Instructions (Signed)

## 2020-07-08 ENCOUNTER — Encounter: Payer: Medicare Other | Admitting: Family Medicine

## 2020-07-13 ENCOUNTER — Other Ambulatory Visit: Payer: Self-pay | Admitting: "Endocrinology

## 2020-07-13 DIAGNOSIS — N1831 Chronic kidney disease, stage 3a: Secondary | ICD-10-CM

## 2020-07-13 DIAGNOSIS — Z794 Long term (current) use of insulin: Secondary | ICD-10-CM

## 2020-07-17 ENCOUNTER — Other Ambulatory Visit: Payer: Self-pay | Admitting: Family Medicine

## 2020-07-21 ENCOUNTER — Encounter: Payer: Medicare Other | Admitting: Family Medicine

## 2020-07-27 ENCOUNTER — Other Ambulatory Visit: Payer: Self-pay | Admitting: "Endocrinology

## 2020-07-27 ENCOUNTER — Other Ambulatory Visit: Payer: Self-pay | Admitting: Family Medicine

## 2020-07-27 DIAGNOSIS — Z794 Long term (current) use of insulin: Secondary | ICD-10-CM

## 2020-08-03 ENCOUNTER — Other Ambulatory Visit: Payer: Self-pay | Admitting: "Endocrinology

## 2020-08-03 DIAGNOSIS — E1122 Type 2 diabetes mellitus with diabetic chronic kidney disease: Secondary | ICD-10-CM

## 2020-08-03 DIAGNOSIS — Z794 Long term (current) use of insulin: Secondary | ICD-10-CM

## 2020-08-24 ENCOUNTER — Ambulatory Visit: Payer: Medicare Other | Admitting: Family Medicine

## 2020-08-26 ENCOUNTER — Other Ambulatory Visit: Payer: Self-pay

## 2020-08-26 ENCOUNTER — Ambulatory Visit (INDEPENDENT_AMBULATORY_CARE_PROVIDER_SITE_OTHER): Payer: Medicare Other | Admitting: Family Medicine

## 2020-08-26 ENCOUNTER — Encounter: Payer: Self-pay | Admitting: Family Medicine

## 2020-08-26 VITALS — BP 158/80 | HR 71 | Resp 16 | Ht 61.0 in | Wt 130.0 lb

## 2020-08-26 DIAGNOSIS — E1122 Type 2 diabetes mellitus with diabetic chronic kidney disease: Secondary | ICD-10-CM | POA: Diagnosis not present

## 2020-08-26 DIAGNOSIS — E782 Mixed hyperlipidemia: Secondary | ICD-10-CM | POA: Diagnosis not present

## 2020-08-26 DIAGNOSIS — N1831 Chronic kidney disease, stage 3a: Secondary | ICD-10-CM | POA: Diagnosis not present

## 2020-08-26 DIAGNOSIS — I10 Essential (primary) hypertension: Secondary | ICD-10-CM

## 2020-08-26 DIAGNOSIS — Z0001 Encounter for general adult medical examination with abnormal findings: Secondary | ICD-10-CM

## 2020-08-26 DIAGNOSIS — Z794 Long term (current) use of insulin: Secondary | ICD-10-CM

## 2020-08-26 MED ORDER — LOSARTAN POTASSIUM 100 MG PO TABS: 100.0000 mg | ORAL_TABLET | Freq: Every day | ORAL | 3 refills | Status: DC

## 2020-08-26 NOTE — Patient Instructions (Addendum)
F/U in office with MD last week in Feb, call if you need me before  Increase losartan to 100 mg daily starting tomorrow   Take meds at 9 am and 9 pm  Careful not to fall  Cut back on fried and fatty foods  Thanks for choosing [facility], we consider it a privelige to serve you.

## 2020-08-27 ENCOUNTER — Encounter: Payer: Self-pay | Admitting: Internal Medicine

## 2020-08-27 ENCOUNTER — Ambulatory Visit (INDEPENDENT_AMBULATORY_CARE_PROVIDER_SITE_OTHER): Payer: Medicare Other | Admitting: Internal Medicine

## 2020-08-27 VITALS — BP 187/78 | HR 66 | Temp 97.7°F | Resp 16 | Ht 61.0 in | Wt 130.1 lb

## 2020-08-27 DIAGNOSIS — R609 Edema, unspecified: Secondary | ICD-10-CM

## 2020-08-27 DIAGNOSIS — I10 Essential (primary) hypertension: Secondary | ICD-10-CM | POA: Diagnosis not present

## 2020-08-27 NOTE — Assessment & Plan Note (Signed)
BP Readings from Last 1 Encounters:  08/27/20 (!) 187/78   Uncontrolled, Losartan was increased yesterday, but has not started taking it yet. Counseled for compliance with the medications Advised DASH diet and moderate exercise/walking, at least 150 mins/week

## 2020-08-27 NOTE — Assessment & Plan Note (Addendum)
Underneath the lip, mild Does not seem to be angioedema History of allergic reaction to spider bite, about 23 years ago. No reports of insect bite recently. Advised to take Benadryl PRN for allergic reaction Educated about angioedema-like allergic reaction and advised to go to ER in that case Advised to have a dental check up for possible gum etiology

## 2020-08-27 NOTE — Patient Instructions (Signed)
Please perform warm water gargles and stay hydrated by taking at least 2 liters of fluid in a day.  Okay to take Benadryl for allergies.  Please schedule a visit with your Dentist.  If you have severe lip swelling leading to difficulty opening mouth or difficulty breathing, please go to ER immediately.

## 2020-08-27 NOTE — Progress Notes (Signed)
Established Patient Office Visit  Subjective:  Patient ID: Emily Jennings, female    DOB: May 22, 1934  Age: 86 y.o. MRN: [id number]  CC:  Chief Complaint  Patient presents with  . Insect Bite    Pt had a insect bite 23 years ago and she has always had this problem however it always comes back and is flared up and swelling. Needs something for this.     HPI Emily Jennings is an 86 year old female with PMH of HTN, DM and HLD who presents for evaluation of lip spot. She has had it for many years. She reports a spiter bite about 23 years ago, after which she had swelling beneath the lip. She has noticed similar swelling beneath the lip, which appears to be very mild. She denies any recent insect bite. She denies any pain, numbness, or twitching. She denies fever, chills, dyspnea, nausea or vomiting. She has not had dental checkup for long time.  Past Medical History:  Diagnosis Date  . Allergy   . Anxiety   . ANXIETY DISORDER, GENERALIZED 07/15/2007   Qualifier: Diagnosis of  By: Rennie Plowman    . Arthritis   . Cancer [facility]) 2009   breast, carcinoma in situ left  . Carcinoma in situ of breast 05/21/2008   Qualifier: Diagnosis of  By: Moshe Cipro MD, Margaret  Diagnosed in 2009, completed 5 year course of tamoxifen, no evidence of recurrence   . Carotid stenosis    11/16/2005  mild plaque formation and stenosis proximal right ECA  . Cataract   . Complication of anesthesia   . Coronary artery disease    cardiac catheterization on 03/20/2006  LAD mid 40% stenosis, left circumflex mild 40% stenosis, RCA mid-vessel 40% to 50% lesion   EF 60%  . Diabetes mellitus   . GERD (gastroesophageal reflux disease)   . Hernia, inguinal    left  . Hyperglycemia   . Hypertension   . Insomnia 11/16/2011  . Low blood potassium   . Non-insulin dependent type 2 diabetes mellitus ([place])   . Osteoporosis   . Shortness of breath    2D Echocardiogram 01/26/2009   EF of greater than 55%, mild MR, mild TR, normal  ventricular function  . Thickened endometrium 10/26/2017   Noted by gyne in 2017, missed 6 month follow up, referred in 09/2017  . Ventricular tachycardia, non-sustained (HCC)    developed during stress test 02/08/2006, spontaneously aborted, mild reversible apical defect    Past Surgical History:  Procedure Laterality Date  . BREAST LUMPECTOMY Left 2009   Left breast 2009  . CATARACT EXTRACTION W/PHACO Left 10/28/2014   Procedure: PHACO EMULSION CATARACT EXTRACTION WITH INTRAOCULAR LENS IMPLANT LEFT EYE (IOC);  Surgeon: Marylynn Pearson, MD;  Location: [facility];  Service: Ophthalmology;  Laterality: Left;  . COLONOSCOPY    . cyst removed from left foot      Family History  Problem Relation Age of Onset  . Hypertension Mother   . Hyperlipidemia Mother   . Stroke Mother   . Cancer Father        pancreatic  . Colon cancer Father   . Heart disease Brother 40       bypass  . Heart disease Brother 26       bypass  . Arthritis Other   . Asthma Other   . Diabetes Other   . Colon cancer Paternal Aunt   . Esophageal cancer Neg Hx   . Stomach cancer Neg Hx   .  Rectal cancer Neg Hx     Social History   Socioeconomic History  . Marital status: Married    Spouse name: Not on file  . Number of children: 2  . Years of education: na  . Highest education level: Some college, no degree  Occupational History  . Occupation: retired    Fish farm manager: retired  Tobacco Use  . Smoking status: Never Smoker  . Smokeless tobacco: Never Used  Vaping Use  . Vaping Use: Never used  Substance and Sexual Activity  . Alcohol use: No  . Drug use: No  . Sexual activity: Not Currently    Birth control/protection: Post-menopausal  Other Topics Concern  . Not on file  Social History Narrative   Lives with her husband and son and attends yoga at the [employer] [facility] [employer] [facility] [facility] 2 days a week and speaks to her grandson nightly on the phone   Social Determinants of Health   Financial Resource Strain: Not on file  Food Insecurity:  Not on file  Transportation Needs: Not on file  Physical Activity: Not on file  Stress: Not on file  Social Connections: Not on file  Intimate Partner Violence: Not on file    Outpatient Medications Prior to Visit  Medication Sig Dispense Refill  . acetaminophen (TYLENOL) 500 MG tablet Take one tablet two times daily for 3 days, then as needed 30 tablet 0  . alendronate (FOSAMAX) 70 MG tablet TAKE 1 TABLET BY MOUTH  EVERY 7 DAYS TAKE WITH A  FULL GLASS OF WATER ON AN  EMPTY STOMACH 12 tablet 3  . amLODipine (NORVASC) 10 MG tablet TAKE 1 TABLET BY MOUTH EVERY DAY 90 tablet 1  . aspirin EC 81 MG tablet Take 81 mg by mouth daily.    . blood glucose meter kit and supplies KIT One touch Ultra. Use up to two times daily as directed. (FOR ICD E11.65) 1 each 0  . cholecalciferol (VITAMIN D3) 25 MCG (1000 UT) tablet Take 1,000 Units by mouth daily.    Marland Kitchen glucose blood test strip Use as instructed to monitor blood glucose 3 times daily, before breakfast, before supper, before bed. 100 each 12  . insulin lispro protamine-lispro (HUMALOG MIX 75/25) (75-25) 100 UNIT/ML SUSP injection INJECT SUBCUTANEOUSLY 10  UNITS TWICE DAILY 30 mL 3  . Insulin Pen Needle 32G X 4 MM MISC 1 each by Does not apply route as directed. 100 each 3  . Insulin Syringe-Needle U-100 (BD INSULIN SYRINGE U/F) 31G X 5/16" 0.5 ML MISC USE AS DIRECTED TWICE DAILY 100 each 3  . losartan (COZAAR) 100 MG tablet Take 1 tablet (100 mg total) by mouth daily. 30 tablet 3  . metoprolol tartrate (LOPRESSOR) 50 MG tablet Take 1 tablet (50 mg total) by mouth 2 (two) times daily. 180 tablet 1  . OneTouch Delica Lancets 94H MISC USE AS DIRECTED TO CHECK BLOOD GLUCOSE FOUR TIMES DAILY 400 each 3  . potassium chloride SA (KLOR-CON M20) 20 MEQ tablet Take 1 tablet (20 mEq total) by mouth 2 (two) times daily. 180 tablet 1  . RESTASIS 0.05 % ophthalmic emulsion     . rosuvastatin (CRESTOR) 5 MG tablet TAKE 1 TABLET BY MOUTH EVERYDAY AT BEDTIME 90 tablet  3  . spironolactone (ALDACTONE) 25 MG tablet TAKE 1 TABLET BY MOUTH  DAILY 90 tablet 3   No facility-administered medications prior to visit.    Allergies  Allergen Reactions  . Benadryl [Diphenhydramine Hcl] Hypertension  . Citalopram   . Metformin And  Related Diarrhea  . Tramadol Other (See Comments)    Felt light headed and dizzy  . Tylenol [Acetaminophen] Other (See Comments)    Patient stated tylenol makes her "feel high"    ROS Review of Systems  Constitutional: Negative for chills and fever.  HENT: Negative for congestion, sinus pressure, sinus pain and sore throat.   Eyes: Negative for pain and discharge.  Respiratory: Negative for cough and shortness of breath.   Cardiovascular: Negative for chest pain and palpitations.  Gastrointestinal: Negative for abdominal pain, constipation, diarrhea, nausea and vomiting.  Endocrine: Negative for polydipsia and polyuria.  Genitourinary: Negative for dysuria and hematuria.  Musculoskeletal: Positive for arthralgias. Negative for neck pain and neck stiffness.  Skin: Negative for rash.  Neurological: Negative for dizziness and weakness.  Psychiatric/Behavioral: Negative for agitation and behavioral problems.      Objective:    Physical Exam Constitutional:      General: She is not in acute distress. HENT:     Head: Normocephalic and atraumatic.     Nose: Nose normal.     Mouth/Throat:     Mouth: Mucous membranes are dry.      Comments: Mild swelling Eyes:     General: No scleral icterus.    Extraocular Movements: Extraocular movements intact.     Pupils: Pupils are equal, round, and reactive to light.  Cardiovascular:     Rate and Rhythm: Normal rate and regular rhythm.     Pulses: Normal pulses.     Heart sounds: Normal heart sounds.  Pulmonary:     Breath sounds: Normal breath sounds. No wheezing or rales.  Neurological:     Mental Status: She is alert and oriented to person, place, and time. Mental status is at  baseline.     BP (!) 187/78 (BP Location: Right Arm, Patient Position: Sitting, Cuff Size: Normal)   Pulse 66   Temp 97.7 F (36.5 C) (Oral)   Resp 16   Ht _0  (1.549 m)   Wt 130 lb 1.9 oz (59 kg)   SpO2 97%   BMI 24.59 kg/m  Wt Readings from Last 3 Encounters:  08/27/20 130 lb 1.9 oz (59 kg)  08/26/20 130 lb (59 kg)  06/30/20 132 lb 9.6 oz (60.1 kg)     Health Maintenance Due  Topic Date Due  . OPHTHALMOLOGY EXAM  04/12/2018  . COVID-19 Vaccine (3 - Booster for Moderna series) 05/28/2020    There are no preventive care reminders to display for this patient.  Lab Results  Component Value Date   TSH 1.29 06/29/2020   Lab Results  Component Value Date   WBC 5.9 03/24/2020   HGB 12.8 03/24/2020   HCT 39.3 03/24/2020   MCV 92.9 03/24/2020   PLT 239 03/24/2020   Lab Results  Component Value Date   NA 142 06/29/2020   K 4.1 06/29/2020   CO2 30 06/29/2020   GLUCOSE 126 (H) 06/29/2020   BUN 27 (H) 06/29/2020   CREATININE 1.24 (H) 06/29/2020   BILITOT 0.5 06/29/2020   ALKPHOS 64 08/29/2017   AST 26 06/29/2020   ALT 16 06/29/2020   PROT 6.2 06/29/2020   ALBUMIN 3.9 08/29/2017   CALCIUM 9.1 06/29/2020   ANIONGAP 8 08/29/2017   Lab Results  Component Value Date   CHOL 207 (H) 06/29/2020   Lab Results  Component Value Date   HDL 115 06/29/2020   Lab Results  Component Value Date   LDLCALC 80 06/29/2020  Lab Results  Component Value Date   TRIG 50 06/29/2020   Lab Results  Component Value Date   CHOLHDL 1.8 06/29/2020   Lab Results  Component Value Date   HGBA1C 7.2 (A) 06/30/2020      Assessment & Plan:   Problem List Items Addressed This Visit      Cardiovascular and Mediastinum   Essential hypertension    BP Readings from Last 1 Encounters:  08/27/20 (!) 187/78   Uncontrolled, Losartan was increased yesterday, but has not started taking it yet. Counseled for compliance with the medications Advised DASH diet and moderate  exercise/walking, at least 150 mins/week         Other   Swelling - Primary    Underneath the lip, mild Does not seem to be angioedema History of allergic reaction to spider bite, about 23 years ago. No reports of insect bite recently. Advised to take Benadryl PRN for allergic reaction Educated about angioedema-like allergic reaction and advised to go to ER in that case Advised to have a dental check up for possible gum etiology         No orders of the defined types were placed in this encounter.   Follow-up: Return if symptoms worsen or fail to improve.    Lindell Spar, MD

## 2020-08-28 ENCOUNTER — Encounter: Payer: Self-pay | Admitting: Family Medicine

## 2020-08-28 ENCOUNTER — Other Ambulatory Visit: Payer: Self-pay | Admitting: Family Medicine

## 2020-08-28 DIAGNOSIS — Z0001 Encounter for general adult medical examination with abnormal findings: Secondary | ICD-10-CM | POA: Insufficient documentation

## 2020-08-28 NOTE — Assessment & Plan Note (Signed)
Controlled, no change in medication Emily Jennings is reminded of the importance of commitment to daily physical activity for 30 minutes or more, as able and the need to limit carbohydrate intake to 30 to 60 grams per meal to help with blood sugar control.   The need to take medication as prescribed, test blood sugar as directed, and to call between visits if there is a concern that blood sugar is uncontrolled is also discussed.   Emily Jennings is reminded of the importance of daily foot exam, annual eye examination, and good blood sugar, blood pressure and cholesterol control.  Diabetic Labs Latest Ref Rng & Units 06/30/2020 06/29/2020 03/24/2020 02/25/2020 10/16/2019  HbA1c 4.0 - 5.6 % 7.2(A) - - 7.2(A) 7.7(A)  Microalbumin mg/dL - - - - -  Micro/Creat Ratio <30 mcg/mg creat - - - - -  Chol <200 mg/dL - 207(H) - - -  HDL > OR = 50 mg/dL - 115 - - -  Calc LDL mg/dL (calc) - 80 - - -  Triglycerides <150 mg/dL - 50 - - -  Creatinine 0.60 - 0.88 mg/dL - 1.24(H) 0.97(H) - -   BP/Weight 08/27/2020 08/26/2020 06/30/2020 03/24/2020 02/25/2020 11/05/2019 03/27/6268  Systolic BP 485 462 703 500 938 182 993  Diastolic BP 78 80 77 80 74 70 70  Wt. (Lbs) 130.12 130 132.6 128.8 126.2 123 123.4  BMI 24.59 24.56 25.9 25.15 23.85 23.24 23.32   Foot/eye exam completion dates Latest Ref Rng & Units 08/26/2020 07/01/2019  Eye Exam No Retinopathy - -  Foot Form Completion - Done Done

## 2020-08-28 NOTE — Assessment & Plan Note (Signed)
Hyperlipidemia:Low fat diet discussed and encouraged.   Lipid Panel  Lab Results  Component Value Date   CHOL 207 (H) 06/29/2020   HDL 115 06/29/2020   LDLCALC 80 06/29/2020   TRIG 50 06/29/2020   CHOLHDL 1.8 06/29/2020  needs to reduce fat in diet

## 2020-08-28 NOTE — Assessment & Plan Note (Signed)
Uncontrolled , increase losartan dose DASH diet and commitment to daily physical activity for a minimum of 30 minutes discussed and encouraged, as a part of hypertension management. The importance of attaining a healthy weight is also discussed.  BP/Weight 08/27/2020 08/26/2020 06/30/2020 03/24/2020 02/25/2020 11/05/2019 1/55/0271  Systolic BP 423 200 941 791 995 790 092  Diastolic BP 78 80 77 80 74 70 70  Wt. (Lbs) 130.12 130 132.6 128.8 126.2 123 123.4  BMI 24.59 24.56 25.9 25.15 23.85 23.24 23.32

## 2020-08-28 NOTE — Assessment & Plan Note (Signed)

## 2020-08-28 NOTE — Progress Notes (Signed)
Emily Jennings     MRN: [id number]      DOB: [birth year]  HPI: Patient is in for annual physical exam. Uncontrolled hypertension is addrssed Recent labs,  are reviewed. Immunization is reviewed , and  updated if needed.   PE: BP (!) 158/80   Pulse 71   Resp 16   Ht 5\' 1"  (1.549 m)   Wt 130 lb (59 kg)   SpO2 97%   BMI 24.56 kg/m   Pleasant  female, alert and oriented x 3, in no cardio-pulmonary distress. Afebrile. HEENT No facial trauma or asymetry. Sinuses non tender.  Extra occullar muscles intact.. External ears normal, . Neck: supple, no adenopathy,JVD or thyromegaly.No bruits.  Chest: Clear to ascultation bilaterally.No crackles or wheezes. Non tender to palpation  Breast: No masses or lumps. No tenderness. No nipple discharge or left nipple  inversion. No axillary or supraclavicular adenopathy  Cardiovascular system; Heart sounds normal,  S1 and  S2 ,no S3.  No murmur, or thrill. Apical beat not displaced Peripheral pulses normal.  Abdomen: Soft, non tender, no organomegaly or masses. No bruits. Bowel sounds normal. No guarding, tenderness or rebound.     Musculoskeletal exam: Full ROM of spine, hips , shoulders and knees. No deformity ,swelling or crepitus noted. No muscle wasting or atrophy.   Neurologic: Cranial nerves 2 to 12 intact. Power, tone ,sensation and reflexes normal throughout. No disturbance in gait. No tremor.  Skin: Intact, no ulceration, erythema , scaling or rash noted. Pigmentation normal throughout  Psych; Normal mood and affect. Judgement and concentration normal   Assessment & Plan:  Essential hypertension Uncontrolled , increase losartan dose DASH diet and commitment to daily physical activity for a minimum of 30 minutes discussed and encouraged, as a part of hypertension management. The importance of attaining a healthy weight is also discussed.  BP/Weight 08/27/2020 08/26/2020 06/30/2020 03/24/2020 02/25/2020  11/05/2019 2/83/6629  Systolic BP 476 546 503 546 568 127 517  Diastolic BP 78 80 77 80 74 70 70  Wt. (Lbs) 130.12 130 132.6 128.8 126.2 123 123.4  BMI 24.59 24.56 25.9 25.15 23.85 23.24 23.32       Annual visit for general adult medical examination with abnormal findings Annual exam as documented. Counseling done  re healthy lifestyle involving commitment to 150 minutes exercise per week, heart healthy diet, and attaining healthy weight.The importance of adequate sleep also discussed. Regular seat belt use and home safety, is also discussed. Changes in health habits are decided on by the patient with goals and time frames  set for achieving them. Immunization and cancer screening needs are specifically addressed at this visit.   Type 2 diabetes mellitus with stage 3a chronic kidney disease, with long-term current use of insulin (HCC) Controlled, no change in medication Ms. Kho is reminded of the importance of commitment to daily physical activity for 30 minutes or more, as able and the need to limit carbohydrate intake to 30 to 60 grams per meal to help with blood sugar control.   The need to take medication as prescribed, test blood sugar as directed, and to call between visits if there is a concern that blood sugar is uncontrolled is also discussed.   Ms. Evon is reminded of the importance of daily foot exam, annual eye examination, and good blood sugar, blood pressure and cholesterol control.  Diabetic Labs Latest Ref Rng & Units 06/30/2020 06/29/2020 03/24/2020 02/25/2020 10/16/2019  HbA1c 4.0 - 5.6 % 7.2(A) - - 7.2(A) 7.7(A)  Microalbumin mg/dL - - - - -  Micro/Creat Ratio <30 mcg/mg creat - - - - -  Chol <200 mg/dL - 207(H) - - -  HDL > OR = 50 mg/dL - 115 - - -  Calc LDL mg/dL (calc) - 80 - - -  Triglycerides <150 mg/dL - 50 - - -  Creatinine 0.60 - 0.88 mg/dL - 1.24(H) 0.97(H) - -   BP/Weight 08/27/2020 08/26/2020 06/30/2020 03/24/2020 02/25/2020 11/05/2019 2/44/9753  Systolic BP  005 110 211 173 567 014 103  Diastolic BP 78 80 77 80 74 70 70  Wt. (Lbs) 130.12 130 132.6 128.8 126.2 123 123.4  BMI 24.59 24.56 25.9 25.15 23.85 23.24 23.32   Foot/eye exam completion dates Latest Ref Rng & Units 08/26/2020 07/01/2019  Eye Exam No Retinopathy - -  Foot Form Completion - Done Done        Mixed hyperlipidemia Hyperlipidemia:Low fat diet discussed and encouraged.   Lipid Panel  Lab Results  Component Value Date   CHOL 207 (H) 06/29/2020   HDL 115 06/29/2020   LDLCALC 80 06/29/2020   TRIG 50 06/29/2020   CHOLHDL 1.8 06/29/2020  needs to reduce fat in diet

## 2020-08-30 ENCOUNTER — Telehealth: Payer: Self-pay | Admitting: Nurse Practitioner

## 2020-08-30 ENCOUNTER — Other Ambulatory Visit: Payer: Self-pay

## 2020-08-30 DIAGNOSIS — Z794 Long term (current) use of insulin: Secondary | ICD-10-CM

## 2020-08-30 MED ORDER — HUMALOG MIX 75/25 (75-25) 100 UNIT/ML ~~LOC~~ SUSP: SUBCUTANEOUS | 3 refills | Status: DC

## 2020-08-30 NOTE — Telephone Encounter (Signed)
Sent in

## 2020-08-30 NOTE — Telephone Encounter (Signed)
Pt is needing a refill on   insulin lispro protamine-lispro (HUMALOG MIX 75/25) (75-25) 100 UNIT/ML SUSP injection   Pt states she does not any longer use [facility]  [place] [place] - [place] Phone:  [phone]  Fax:  [phone]

## 2020-09-21 ENCOUNTER — Other Ambulatory Visit: Payer: Self-pay | Admitting: Family Medicine

## 2020-09-21 ENCOUNTER — Telehealth: Payer: Self-pay

## 2020-09-21 NOTE — Telephone Encounter (Signed)
Patient needing a refill on spironolactone

## 2020-09-21 NOTE — Telephone Encounter (Signed)
This has been refilled earlier

## 2020-09-28 ENCOUNTER — Other Ambulatory Visit (HOSPITAL_COMMUNITY): Payer: Self-pay | Admitting: Family Medicine

## 2020-09-28 DIAGNOSIS — Z1231 Encounter for screening mammogram for malignant neoplasm of breast: Secondary | ICD-10-CM

## 2020-10-14 ENCOUNTER — Other Ambulatory Visit: Payer: Self-pay

## 2020-10-14 ENCOUNTER — Telehealth (INDEPENDENT_AMBULATORY_CARE_PROVIDER_SITE_OTHER): Payer: Medicare Other | Admitting: Family Medicine

## 2020-10-14 VITALS — Ht 61.0 in | Wt 131.0 lb

## 2020-10-14 DIAGNOSIS — I10 Essential (primary) hypertension: Secondary | ICD-10-CM

## 2020-10-14 DIAGNOSIS — E1122 Type 2 diabetes mellitus with diabetic chronic kidney disease: Secondary | ICD-10-CM | POA: Diagnosis not present

## 2020-10-14 DIAGNOSIS — L989 Disorder of the skin and subcutaneous tissue, unspecified: Secondary | ICD-10-CM | POA: Diagnosis not present

## 2020-10-14 DIAGNOSIS — R067 Sneezing: Secondary | ICD-10-CM | POA: Diagnosis not present

## 2020-10-14 DIAGNOSIS — N1831 Chronic kidney disease, stage 3a: Secondary | ICD-10-CM

## 2020-10-14 DIAGNOSIS — R220 Localized swelling, mass and lump, head: Secondary | ICD-10-CM | POA: Diagnosis not present

## 2020-10-14 DIAGNOSIS — D126 Benign neoplasm of colon, unspecified: Secondary | ICD-10-CM | POA: Diagnosis not present

## 2020-10-14 DIAGNOSIS — Z794 Long term (current) use of insulin: Secondary | ICD-10-CM | POA: Diagnosis not present

## 2020-10-14 DIAGNOSIS — M79604 Pain in right leg: Secondary | ICD-10-CM

## 2020-10-14 DIAGNOSIS — E782 Mixed hyperlipidemia: Secondary | ICD-10-CM | POA: Diagnosis not present

## 2020-10-14 MED ORDER — MUPIROCIN 2 % EX OINT: TOPICAL_OINTMENT | Freq: Two times a day (BID) | CUTANEOUS | 0 refills | Status: AC

## 2020-10-14 NOTE — Assessment & Plan Note (Signed)
DASH diet and commitment to daily physical activity for a minimum of 30 minutes discussed and encouraged, as a part of hypertension management. The importance of attaining a healthy weight is also discussed.  BP/Weight 10/14/2020 08/27/2020 08/26/2020 06/30/2020 03/24/2020 02/25/2020 9/39/6886  Systolic BP - 484 720 721 828 833 744  Diastolic BP - 78 80 77 80 74 70  Wt. (Lbs) 131 130.12 130 132.6 128.8 126.2 123  BMI 24.75 24.59 24.56 25.9 25.15 23.85 23.24   In offic e eva;l

## 2020-10-14 NOTE — Patient Instructions (Addendum)
F/U in office  re evaluate uncontrolled blood Pressure in 4 weeks, call if you need me sooner  Video f/u with Dr Moshe Cipro in 3 months  Please get X ray of your right leg, this is ordered  You are referred to allergist fo testing due to c/o lip swelling intermittently over the years, worse in the past 3 months  Thanks for choosing [facility], we consider it a privelige to serve you.

## 2020-10-14 NOTE — Progress Notes (Signed)
Virtual Visit via Telephone Note  I connected with Emily Jennings on 10/14/20 at  3:20 PM EST by telephone and verified that I am speaking with the correct person using two identifiers.  Location: Patient: home Provider: work   I discussed the limitations, risks, security and privacy concerns of performing an evaluation and management service by telephone and the availability of in person appointments. I also discussed with the patient that there may be a patient responsible charge related to this service. The patient expressed understanding and agreed to proceed.   History of Present Illness: H/o lower lip swelling following spider bite 23 years ago, now still concerned about lower lip swelling intermittently C/o right leg swelling and pain  , no recent trauma   Observations/Objective: Ht 5\' 1"  (1.549 m)   Wt 131 lb (59.4 kg)   BMI 24.75 kg/m  Good communication with no confusion and intact memory. Alert and oriented x 3 No signs of respiratory distress during speech    Assessment and Plan:  Essential hypertension DASH diet and commitment to daily physical activity for a minimum of 30 minutes discussed and encouraged, as a part of hypertension management. The importance of attaining a healthy weight is also discussed.  BP/Weight 10/14/2020 08/27/2020 08/26/2020 06/30/2020 03/24/2020 02/25/2020 11/06/7586  Systolic BP - 325 498 264 158 309 407  Diastolic BP - 78 80 77 80 74 70  Wt. (Lbs) 131 130.12 130 132.6 128.8 126.2 123  BMI 24.75 24.59 24.56 25.9 25.15 23.85 23.24   In offic e eva;l    Lip swelling Intermittent over past 23 years. No specific inciting agent noted, denies cough excessive salivation in mouth no wheezing Refer to allergist, pt reports remote spider bite history to th area and is concerned about this. Clinical in office exam in the past unrevealing  Right leg pain Chronic c/o right leg pain and swelling with no recent trauma, will obtain X ray  Mixed  hyperlipidemia Hyperlipidemia:Low fat diet discussed and encouraged.   Lipid Panel  Lab Results  Component Value Date   CHOL 207 (H) 06/29/2020   HDL 115 06/29/2020   [place] 80 06/29/2020   TRIG 50 06/29/2020   CHOLHDL 1.8 06/29/2020    needs to lower fat intake   Type 2 diabetes mellitus with stage 3a chronic kidney disease, with long-term current use of insulin ([place]) Emily Jennings is reminded of the importance of commitment to daily physical activity for 30 minutes or more, as able and the need to limit carbohydrate intake to 30 to 60 grams per meal to help with blood sugar control.   The need to take medication as prescribed, test blood sugar as directed, and to call between visits if there is a concern that blood sugar is uncontrolled is also discussed.  Controlled and managed by Endo  Emily Jennings is reminded of the importance of daily foot exam, annual eye examination, and good blood sugar, blood pressure and cholesterol control.  Diabetic Labs Latest Ref Rng & Units 06/30/2020 06/29/2020 03/24/2020 02/25/2020 10/16/2019  HbA1c 4.0 - 5.6 % 7.2(A) - - 7.2(A) 7.7(A)  Microalbumin mg/dL - - - - -  Micro/Creat Ratio <30 mcg/mg creat - - - - -  Chol <200 mg/dL - 207(H) - - -  HDL > OR = 50 mg/dL - 115 - - -  Calc LDL mg/dL (calc) - 80 - - -  Triglycerides <150 mg/dL - 50 - - -  Creatinine 0.60 - 0.88 mg/dL - 1.24(H) 0.97(H) - -  BP/Weight 10/14/2020 08/27/2020 08/26/2020 06/30/2020 03/24/2020 02/25/2020 6/38/4665  Systolic BP - 993 570 177 939 030 092  Diastolic BP - 78 80 77 80 74 70  Wt. (Lbs) 131 130.12 130 132.6 128.8 126.2 123  BMI 24.75 24.59 24.56 25.9 25.15 23.85 23.24   Foot/eye exam completion dates Latest Ref Rng & Units 08/26/2020 07/01/2019  Eye Exam No Retinopathy - -  Foot Form Completion - Done Done        Tubular adenoma of colon No c/o abdominal pain or change in BM, no specific GI follow up on record review   Follow Up Instructions:    I discussed the  assessment and treatment plan with the patient. The patient was provided an opportunity to ask questions and all were answered. The patient agreed with the plan and demonstrated an understanding of the instructions.   The patient was advised to call back or seek an in-person evaluation if the symptoms worsen or if the condition fails to improve as anticipated.  I provided 18 minutes of non-face-to-face time during this encounter.   Emily Nakayama, MD

## 2020-10-20 ENCOUNTER — Other Ambulatory Visit: Payer: Self-pay | Admitting: Family Medicine

## 2020-10-21 ENCOUNTER — Other Ambulatory Visit: Payer: Self-pay

## 2020-10-21 MED ORDER — LOSARTAN POTASSIUM 100 MG PO TABS: 100.0000 mg | ORAL_TABLET | Freq: Every day | ORAL | 5 refills | Status: DC

## 2020-10-27 ENCOUNTER — Other Ambulatory Visit: Payer: Self-pay

## 2020-10-27 ENCOUNTER — Telehealth: Payer: Self-pay

## 2020-10-27 DIAGNOSIS — N1831 Chronic kidney disease, stage 3a: Secondary | ICD-10-CM

## 2020-10-27 DIAGNOSIS — Z794 Long term (current) use of insulin: Secondary | ICD-10-CM

## 2020-10-27 MED ORDER — BLOOD GLUCOSE MONITOR KIT: PACK | 0 refills | Status: DC

## 2020-10-27 MED ORDER — ONETOUCH ULTRA VI STRP: ORAL_STRIP | 12 refills | Status: DC

## 2020-10-27 NOTE — Telephone Encounter (Signed)
Patient left a voicemail that she has been having readings in the 300's and 500's in the evenings. Can you return her call?

## 2020-10-27 NOTE — Telephone Encounter (Signed)
Increase to 15 units with breakfast and 10 units with supper to avoid night time lows.

## 2020-10-27 NOTE — Telephone Encounter (Signed)
Patient stated that BG readings are as follows, 6th am 311, 283 supper, hs 53, 7th was high and forgot to write down, 8th am 127, supper 567, 166 hs, 9th 96 am, please advise, she is taking 10 units BID

## 2020-10-27 NOTE — Telephone Encounter (Signed)
Returned call to patient and advised, verbalized understanding 

## 2020-10-29 ENCOUNTER — Other Ambulatory Visit: Payer: Self-pay

## 2020-10-29 ENCOUNTER — Ambulatory Visit (HOSPITAL_COMMUNITY)
Admission: RE | Admit: 2020-10-29 | Discharge: 2020-10-29 | Disposition: A | Discharge status: Home / Self Care | Payer: Medicare Other | Source: Ambulatory Visit | Attending: Family Medicine | Admitting: Family Medicine

## 2020-10-29 DIAGNOSIS — R600 Localized edema: Secondary | ICD-10-CM | POA: Diagnosis not present

## 2020-10-29 DIAGNOSIS — M1711 Unilateral primary osteoarthritis, right knee: Secondary | ICD-10-CM | POA: Diagnosis not present

## 2020-10-29 DIAGNOSIS — I708 Atherosclerosis of other arteries: Secondary | ICD-10-CM | POA: Diagnosis not present

## 2020-10-29 DIAGNOSIS — Z1231 Encounter for screening mammogram for malignant neoplasm of breast: Secondary | ICD-10-CM | POA: Diagnosis not present

## 2020-10-29 DIAGNOSIS — M79604 Pain in right leg: Secondary | ICD-10-CM | POA: Insufficient documentation

## 2020-10-29 DIAGNOSIS — I878 Other specified disorders of veins: Secondary | ICD-10-CM | POA: Diagnosis not present

## 2020-10-30 ENCOUNTER — Encounter: Payer: Self-pay | Admitting: Family Medicine

## 2020-10-30 DIAGNOSIS — E1122 Type 2 diabetes mellitus with diabetic chronic kidney disease: Secondary | ICD-10-CM | POA: Insufficient documentation

## 2020-10-30 DIAGNOSIS — R220 Localized swelling, mass and lump, head: Secondary | ICD-10-CM | POA: Insufficient documentation

## 2020-10-30 DIAGNOSIS — N184 Chronic kidney disease, stage 4 (severe): Secondary | ICD-10-CM | POA: Insufficient documentation

## 2020-10-30 DIAGNOSIS — M79604 Pain in right leg: Secondary | ICD-10-CM | POA: Insufficient documentation

## 2020-10-30 NOTE — Assessment & Plan Note (Signed)
No c/o abdominal pain or change in BM, no specific GI follow up on record review

## 2020-10-30 NOTE — Assessment & Plan Note (Signed)
Intermittent over past 23 years. No specific inciting agent noted, denies cough excessive salivation in mouth no wheezing Refer to allergist, pt reports remote spider bite history to th area and is concerned about this. Clinical in office exam in the past unrevealing

## 2020-10-30 NOTE — Assessment & Plan Note (Signed)
Chronic c/o right leg pain and swelling with no recent trauma, will obtain X ray

## 2020-10-30 NOTE — Assessment & Plan Note (Addendum)
Emily Jennings is reminded of the importance of commitment to daily physical activity for 30 minutes or more, as able and the need to limit carbohydrate intake to 30 to 60 grams per meal to help with blood sugar control.   The need to take medication as prescribed, test blood sugar as directed, and to call between visits if there is a concern that blood sugar is uncontrolled is also discussed.  Controlled and managed by Endo  Emily Jennings is reminded of the importance of daily foot exam, annual eye examination, and good blood sugar, blood pressure and cholesterol control.  Diabetic Labs Latest Ref Rng & Units 06/30/2020 06/29/2020 03/24/2020 02/25/2020 10/16/2019  HbA1c 4.0 - 5.6 % 7.2(A) - - 7.2(A) 7.7(A)  Microalbumin mg/dL - - - - -  Micro/Creat Ratio <30 mcg/mg creat - - - - -  Chol <200 mg/dL - 207(H) - - -  HDL > OR = 50 mg/dL - 115 - - -  Calc LDL mg/dL (calc) - 80 - - -  Triglycerides <150 mg/dL - 50 - - -  Creatinine 0.60 - 0.88 mg/dL - 1.24(H) 0.97(H) - -   BP/Weight 10/14/2020 08/27/2020 08/26/2020 06/30/2020 03/24/2020 02/25/2020 1/85/6314  Systolic BP - 970 263 785 885 027 741  Diastolic BP - 78 80 77 80 74 70  Wt. (Lbs) 131 130.12 130 132.6 128.8 126.2 123  BMI 24.75 24.59 24.56 25.9 25.15 23.85 23.24   Foot/eye exam completion dates Latest Ref Rng & Units 08/26/2020 07/01/2019  Eye Exam No Retinopathy - -  Foot Form Completion - Done Done

## 2020-10-30 NOTE — Assessment & Plan Note (Signed)
Hyperlipidemia:Low fat diet discussed and encouraged.   Lipid Panel  Lab Results  Component Value Date   CHOL 207 (H) 06/29/2020   HDL 115 06/29/2020   LDLCALC 80 06/29/2020   TRIG 50 06/29/2020   CHOLHDL 1.8 06/29/2020    needs to lower fat intake

## 2020-11-04 ENCOUNTER — Other Ambulatory Visit (HOSPITAL_COMMUNITY): Payer: Self-pay | Admitting: Family Medicine

## 2020-11-04 DIAGNOSIS — R928 Other abnormal and inconclusive findings on diagnostic imaging of breast: Secondary | ICD-10-CM

## 2020-11-05 ENCOUNTER — Other Ambulatory Visit: Payer: Self-pay

## 2020-11-05 ENCOUNTER — Encounter: Payer: Medicare Other | Admitting: Family Medicine

## 2020-11-08 ENCOUNTER — Other Ambulatory Visit: Payer: Self-pay

## 2020-11-10 ENCOUNTER — Ambulatory Visit: Payer: Medicare Other | Admitting: Nurse Practitioner

## 2020-11-10 ENCOUNTER — Telehealth (INDEPENDENT_AMBULATORY_CARE_PROVIDER_SITE_OTHER): Payer: Medicare Other

## 2020-11-10 ENCOUNTER — Other Ambulatory Visit: Payer: Self-pay

## 2020-11-10 VITALS — Ht 60.0 in | Wt 131.0 lb

## 2020-11-10 DIAGNOSIS — Z0000 Encounter for general adult medical examination without abnormal findings: Secondary | ICD-10-CM

## 2020-11-10 DIAGNOSIS — H0015 Chalazion left lower eyelid: Secondary | ICD-10-CM | POA: Diagnosis not present

## 2020-11-10 DIAGNOSIS — H2511 Age-related nuclear cataract, right eye: Secondary | ICD-10-CM | POA: Diagnosis not present

## 2020-11-10 DIAGNOSIS — H47232 Glaucomatous optic atrophy, left eye: Secondary | ICD-10-CM | POA: Diagnosis not present

## 2020-11-10 DIAGNOSIS — H04123 Dry eye syndrome of bilateral lacrimal glands: Secondary | ICD-10-CM | POA: Diagnosis not present

## 2020-11-10 LAB — HM DIABETES EYE EXAM

## 2020-11-10 NOTE — Patient Instructions (Addendum)
Emily Jennings , Thank you for taking time to come for your Medicare Wellness Visit. I appreciate your ongoing commitment to your health goals. Please review the following plan we discussed and let me know if I can assist you in the future.   Screening recommendations/referrals: Colonoscopy: Complete Mammogram: Complete Bone Density: Complete Recommended yearly ophthalmology/optometry visit for glaucoma screening and checkup Recommended yearly dental visit for hygiene and checkup  Vaccinations: Influenza vaccine: Fall 2022 Pneumococcal vaccine: Complete Tdap vaccine: 07/16/21 Shingles vaccine: Declined  Advanced directives: POA and Living Will  Conditions/risks identified: None  Next appointment: 11/11/20 @ 1 pm   Preventive Care 65 Years and Older, Female Preventive care refers to lifestyle choices and visits with your health care provider that can promote health and wellness. What does preventive care include?  A yearly physical exam. This is also called an annual well check.  Dental exams once or twice a year.  Routine eye exams. Ask your health care provider how often you should have your eyes checked.  Personal lifestyle choices, including:  Daily care of your teeth and gums.  Regular physical activity.  Eating a healthy diet.  Avoiding tobacco and drug use.  Limiting alcohol use.  Practicing safe sex.  Taking low-dose aspirin every day.  Taking vitamin and mineral supplements as recommended by your health care provider. What happens during an annual well check? The services and screenings done by your health care provider during your annual well check will depend on your age, overall health, lifestyle risk factors, and family history of disease. Counseling  Your health care provider may ask you questions about your:  Alcohol use.  Tobacco use.  Drug use.  Emotional well-being.  Home and relationship well-being.  Sexual activity.  Eating  habits.  History of falls.  Memory and ability to understand (cognition).  Work and work Statistician.  Reproductive health. Screening  You may have the following tests or measurements:  Height, weight, and BMI.  Blood pressure.  Lipid and cholesterol levels. These may be checked every 5 years, or more frequently if you are over 38 years old.  Skin check.  Lung cancer screening. You may have this screening every year starting at age 23 if you have a 30-pack-year history of smoking and currently smoke or have quit within the past 15 years.  Fecal occult blood test (FOBT) of the stool. You may have this test every year starting at age 61.  Flexible sigmoidoscopy or colonoscopy. You may have a sigmoidoscopy every 5 years or a colonoscopy every 10 years starting at age 49.  Hepatitis C blood test.  Hepatitis B blood test.  Sexually transmitted disease (STD) testing.  Diabetes screening. This is done by checking your blood sugar (glucose) after you have not eaten for a while (fasting). You may have this done every 1-3 years.  Bone density scan. This is done to screen for osteoporosis. You may have this done starting at age 74.  Mammogram. This may be done every 1-2 years. Talk to your health care provider about how often you should have regular mammograms. Talk with your health care provider about your test results, treatment options, and if necessary, the need for more tests. Vaccines  Your health care provider may recommend certain vaccines, such as:  Influenza vaccine. This is recommended every year.  Tetanus, diphtheria, and acellular pertussis (Tdap, Td) vaccine. You may need a Td booster every 10 years.  Zoster vaccine. You may need this after age 40.  Pneumococcal  13-valent conjugate (PCV13) vaccine. One dose is recommended after age 25.  Pneumococcal polysaccharide (PPSV23) vaccine. One dose is recommended after age 12. Talk to your health care provider about which  screenings and vaccines you need and how often you need them. This information is not intended to replace advice given to you by your health care provider. Make sure you discuss any questions you have with your health care provider. Document Released: 10/01/2015 Document Revised: 05/24/2016 Document Reviewed: 07/06/2015 Elsevier Interactive Patient Education  2017 [employer] Prevention in the Home Falls can cause injuries. They can happen to people of all ages. There are many things you can do to make your home safe and to help prevent falls. What can I do on the outside of my home?  Regularly fix the edges of walkways and driveways and fix any cracks.  Remove anything that might make you trip as you walk through a door, such as a raised step or threshold.  Trim any bushes or trees on the path to your home.  Use bright outdoor lighting.  Clear any walking paths of anything that might make someone trip, such as rocks or tools.  Regularly check to see if handrails are loose or broken. Make sure that both sides of any steps have handrails.  Any raised decks and porches should have guardrails on the edges.  Have any leaves, snow, or ice cleared regularly.  Use sand or salt on walking paths during winter.  Clean up any spills in your garage right away. This includes oil or grease spills. What can I do in the bathroom?  Use night lights.  Install grab bars by the toilet and in the tub and shower. Do not use towel bars as grab bars.  Use non-skid mats or decals in the tub or shower.  If you need to sit down in the shower, use a plastic, non-slip stool.  Keep the floor dry. Clean up any water that spills on the floor as soon as it happens.  Remove soap buildup in the tub or shower regularly.  Attach bath mats securely with double-sided non-slip rug tape.  Do not have throw rugs and other things on the floor that can make you trip. What can I do in the bedroom?  Use  night lights.  Make sure that you have a light by your bed that is easy to reach.  Do not use any sheets or blankets that are too big for your bed. They should not hang down onto the floor.  Have a firm chair that has side arms. You can use this for support while you get dressed.  Do not have throw rugs and other things on the floor that can make you trip. What can I do in the kitchen?  Clean up any spills right away.  Avoid walking on wet floors.  Keep items that you use a lot in easy-to-reach places.  If you need to reach something above you, use a strong step stool that has a grab bar.  Keep electrical cords out of the way.  Do not use floor polish or wax that makes floors slippery. If you must use wax, use non-skid floor wax.  Do not have throw rugs and other things on the floor that can make you trip. What can I do with my stairs?  Do not leave any items on the stairs.  Make sure that there are handrails on both sides of the stairs and use them. Fix  handrails that are broken or loose. Make sure that handrails are as long as the stairways.  Check any carpeting to make sure that it is firmly attached to the stairs. Fix any carpet that is loose or worn.  Avoid having throw rugs at the top or bottom of the stairs. If you do have throw rugs, attach them to the floor with carpet tape.  Make sure that you have a light switch at the top of the stairs and the bottom of the stairs. If you do not have them, ask someone to add them for you. What else can I do to help prevent falls?  Wear shoes that:  Do not have high heels.  Have rubber bottoms.  Are comfortable and fit you well.  Are closed at the toe. Do not wear sandals.  If you use a stepladder:  Make sure that it is fully opened. Do not climb a closed stepladder.  Make sure that both sides of the stepladder are locked into place.  Ask someone to hold it for you, if possible.  Clearly mark and make sure that you  can see:  Any grab bars or handrails.  First and last steps.  Where the edge of each step is.  Use tools that help you move around (mobility aids) if they are needed. These include:  Canes.  Walkers.  Scooters.  Crutches.  Turn on the lights when you go into a dark area. Replace any light bulbs as soon as they burn out.  Set up your furniture so you have a clear path. Avoid moving your furniture around.  If any of your floors are uneven, fix them.  If there are any pets around you, be aware of where they are.  Review your medicines with your doctor. Some medicines can make you feel dizzy. This can increase your chance of falling. Ask your doctor what other things that you can do to help prevent falls. This information is not intended to replace advice given to you by your health care provider. Make sure you discuss any questions you have with your health care provider. Document Released: 07/01/2009 Document Revised: 02/10/2016 Document Reviewed: 10/09/2014 Elsevier Interactive Patient Education  2017 [employer].

## 2020-11-10 NOTE — Progress Notes (Signed)
Subjective:   Emily Jennings is a 87 y.o. female who presents for Medicare Annual (Subsequent) preventive examination.  Review of Systems       Objective:    There were no vitals filed for this visit. There is no height or weight on file to calculate BMI.  Advanced Directives 10/09/2018 09/03/2018 10/01/2017 09/28/2016 03/29/2015 01/06/2015  Does Patient Have a Medical Advance Directive? No Yes Yes No No No  Would patient like information on creating a medical advance directive? Yes (ED - Information included in AVS) - - Yes (MAU/Ambulatory/Procedural Areas - Information given) - No - patient declined information    Current Medications (verified) Outpatient Encounter Medications as of 11/10/2020  Medication Sig  . acetaminophen (TYLENOL) 500 MG tablet Take one tablet two times daily for 3 days, then as needed  . alendronate (FOSAMAX) 70 MG tablet TAKE 1 TABLET BY MOUTH  EVERY 7 DAYS TAKE WITH A  FULL GLASS OF WATER ON AN  EMPTY STOMACH  . amLODipine (NORVASC) 10 MG tablet TAKE 1 TABLET BY MOUTH EVERY DAY  . aspirin EC 81 MG tablet Take 81 mg by mouth daily.  . blood glucose meter kit and supplies KIT One touch Ultra. Use three times daily as directed. (FOR ICD E11.65)  . cholecalciferol (VITAMIN D3) 25 MCG (1000 UT) tablet Take 1,000 Units by mouth daily.  Marland Kitchen glucose blood (ONETOUCH ULTRA) test strip Use as instructed to test blood glucose three times daily. DX E11.65  . insulin lispro protamine-lispro (HUMALOG MIX 75/25) (75-25) 100 UNIT/ML SUSP injection INJECT SUBCUTANEOUSLY 10  UNITS TWICE DAILY  . Insulin Pen Needle 32G X 4 MM MISC 1 each by Does not apply route as directed.  . Insulin Syringe-Needle U-100 (BD INSULIN SYRINGE U/F) 31G X 5/16" 0.5 ML MISC USE AS DIRECTED TWICE DAILY  . losartan (COZAAR) 100 MG tablet Take 1 tablet (100 mg total) by mouth daily.  . metoprolol tartrate (LOPRESSOR) 50 MG tablet Take 1 tablet (50 mg total) by mouth 2 (two) times daily.  Glory Rosebush  Delica Lancets 29F MISC USE AS DIRECTED TO CHECK BLOOD GLUCOSE FOUR TIMES DAILY  . potassium chloride SA (KLOR-CON M20) 20 MEQ tablet Take 1 tablet (20 mEq total) by mouth 2 (two) times daily.  . RESTASIS 0.05 % ophthalmic emulsion   . rosuvastatin (CRESTOR) 5 MG tablet TAKE 1 TABLET BY MOUTH EVERYDAY AT BEDTIME  . spironolactone (ALDACTONE) 25 MG tablet TAKE 1 TABLET BY MOUTH EVERY DAY   No facility-administered encounter medications on file as of 11/10/2020.    Allergies (verified) Benadryl [diphenhydramine hcl], Citalopram, Metformin and related, Tramadol, and Tylenol [acetaminophen]   History: Past Medical History:  Diagnosis Date  . Allergy   . Anxiety   . ANXIETY DISORDER, GENERALIZED 07/15/2007   Qualifier: Diagnosis of  By: Rennie Plowman    . Arthritis   . Cancer [facility]) 2009   breast, carcinoma in situ left  . Carcinoma in situ of breast 05/21/2008   Qualifier: Diagnosis of  By: Moshe Cipro MD, Margaret  Diagnosed in 2009, completed 5 year course of tamoxifen, no evidence of recurrence   . Carotid stenosis    11/16/2005  mild plaque formation and stenosis proximal right ECA  . Cataract   . Complication of anesthesia   . Coronary artery disease    cardiac catheterization on 03/20/2006  LAD mid 40% stenosis, left circumflex mild 40% stenosis, RCA mid-vessel 40% to 50% lesion   EF 60%  . Diabetes  mellitus   . GERD (gastroesophageal reflux disease)   . Hernia, inguinal    left  . Hyperglycemia   . Hypertension   . Insomnia 11/16/2011  . Low blood potassium   . Non-insulin dependent type 2 diabetes mellitus ([place])   . Osteoporosis   . Shortness of breath    2D Echocardiogram 01/26/2009   EF of greater than 55%, mild MR, mild TR, normal ventricular function  . Thickened endometrium 10/26/2017   Noted by gyne in 2017, missed 6 month follow up, referred in 09/2017  . Ventricular tachycardia, non-sustained ([facility])    developed during stress test 02/08/2006, spontaneously aborted, mild  reversible apical defect   Past Surgical History:  Procedure Laterality Date  . BREAST LUMPECTOMY Left 2009   Left breast 2009  . CATARACT EXTRACTION W/PHACO Left 10/28/2014   Procedure: PHACO EMULSION CATARACT EXTRACTION WITH INTRAOCULAR LENS IMPLANT LEFT EYE (IOC);  Surgeon: Marylynn Pearson, MD;  Location: [facility];  Service: Ophthalmology;  Laterality: Left;  . COLONOSCOPY    . cyst removed from left foot     Family History  Problem Relation Age of Onset  . Hypertension Mother   . Hyperlipidemia Mother   . Stroke Mother   . Cancer Father        pancreatic  . Colon cancer Father   . Heart disease Brother 40       bypass  . Heart disease Brother 29       bypass  . Arthritis Other   . Asthma Other   . Diabetes Other   . Colon cancer Paternal Aunt   . Esophageal cancer Neg Hx   . Stomach cancer Neg Hx   . Rectal cancer Neg Hx    Social History   Socioeconomic History  . Marital status: Married    Spouse name: Not on file  . Number of children: 2  . Years of education: na  . Highest education level: Some college, no degree  Occupational History  . Occupation: retired    Fish farm manager: retired  Tobacco Use  . Smoking status: Never Smoker  . Smokeless tobacco: Never Used  Vaping Use  . Vaping Use: Never used  Substance and Sexual Activity  . Alcohol use: No  . Drug use: No  . Sexual activity: Not Currently    Birth control/protection: Post-menopausal  Other Topics Concern  . Not on file  Social History Narrative   Lives with her husband and son and attends yoga at the [facility] [employer] 2 days a week and speaks to her grandson nightly on the phone   Social Determinants of Health   Financial Resource Strain: Not on file  Food Insecurity: Not on file  Transportation Needs: Not on file  Physical Activity: Not on file  Stress: Not on file  Social Connections: Not on file    Tobacco Counseling Counseling given: Not Answered   Clinical Intake:                 Diabetic?  yes         Activities of Daily Living No flowsheet data found.  Patient Care Team: [name], MD as PCP - General (Family Medicine) Cassandria Anger, MD as Consulting Physician (Endocrinology) Marylynn Pearson, MD as Consulting Physician (Ophthalmology)  Indicate any recent Medical Services you may have received from other than [facility] providers in the past year (date may be approximate).     Assessment:   This is a routine wellness examination for [place].  Hearing/Vision screen No exam data present  Dietary issues and exercise activities discussed:    Goals    .  Exercise 3x per week (30 min per time) (pt-stated)      Starting 09/29/2016 I would like to start exercising 20 minutes a day every day of the week.    Marland Kitchen  HEMOGLOBIN A1C < 7.0      Cut carbs and portion sizes     .  Prevent falls      Slow down and take her time      Depression Screen PHQ 2/9 Scores 10/14/2020 08/27/2020 08/26/2020 03/24/2020 11/05/2019 07/23/2019 07/01/2019  PHQ - 2 Score 0 0 0 0 0 0 0  PHQ- 9 Score - - - - - - -    Fall Risk Fall Risk  10/14/2020 08/27/2020 08/26/2020 03/24/2020 11/05/2019  Falls in the past year? 0 0 0 0 0  Number falls in past yr: 0 0 - 0 0  Injury with Fall? 0 0 - 0 0  Risk for fall due to : - No Fall Risks - No Fall Risks -  Risk for fall due to: Comment - - - - -  Follow up - Falls evaluation completed - Falls evaluation completed Falls evaluation completed;Education provided;Falls prevention discussed    FALL RISK PREVENTION PERTAINING TO THE HOME:  Any stairs in or around the home? No  If so, are there any without handrails? n/a Home free of loose throw rugs in walkways, pet beds, electrical cords, etc? Yes  Adequate lighting in your home to reduce risk of falls? Yes   ASSISTIVE DEVICES UTILIZED TO PREVENT FALLS:  Life alert? No  Use of a cane, walker or w/c? No  Grab bars in the bathroom? Yes  Shower chair or bench in shower? No  Elevated toilet  seat or a handicapped toilet? No   TIMED UP AND GO:  Was the test performed? No   Length of time to ambulate n/a   Cognitive Function:     6CIT Screen 11/05/2019 10/09/2018 10/01/2017 09/28/2016  What Year? 0 points 0 points 0 points 0 points  What month? 0 points 0 points 0 points 0 points  What time? 0 points 0 points 0 points 0 points  Count back from 20 0 points 0 points 2 points 0 points  Months in reverse 0 points 0 points 0 points 0 points  Repeat phrase 2 points 2 points 2 points 2 points  Total Score _0 Immunizations Immunization History  Administered Date(s) Administered  . Fluad Quad(high Dose 65+) 06/02/2019, 05/27/2020  . Influenza Split 07/17/2011  . Influenza Whole 06/18/2007, 07/29/2009, 05/16/2013  . Influenza, High Dose Seasonal PF 08/13/2018  . Influenza,inj,Quad PF,6+ Mos 05/19/2014, 08/22/2016, 05/17/2017  . Moderna Sars-Covid-2 Vaccination 10/26/2019, 11/26/2019  . Pneumococcal Conjugate-13 03/24/2014  . Pneumococcal Polysaccharide-23 05/20/2008  . Td 06/18/2002  . Tdap 07/17/2011    TDAP status: Up to date  Flu Vaccine status: Up to date  Pneumococcal vaccine status: Up to date  Covid-19 vaccine status: Completed vaccines  Qualifies for Shingles Vaccine? Yes   Zostavax completed No   Shingrix Completed?: No.    Education has been provided regarding the importance of this vaccine. Patient has been advised to call insurance company to determine out of pocket expense if they have not yet received this vaccine. Advised may also receive vaccine at local pharmacy or Health Dept. Verbalized acceptance and understanding.  Screening Tests Health Maintenance  Topic Date Due  . OPHTHALMOLOGY EXAM  04/12/2018  . COVID-19 Vaccine (3 - Booster for Moderna series) 05/28/2020  . HEMOGLOBIN A1C  12/29/2020  . TETANUS/TDAP  07/16/2021  . FOOT EXAM  08/28/2021  . INFLUENZA VACCINE  Completed  . DEXA SCAN  Completed  . PNA vac Low Risk Adult  Completed     Health Maintenance  Health Maintenance Due  Topic Date Due  . OPHTHALMOLOGY EXAM  04/12/2018  . COVID-19 Vaccine (3 - Booster for Moderna series) 05/28/2020    Colorectal cancer screening: No longer required.   Mammogram status: No longer required due to age 3+.  Bone Density status: Completed 10/18/17. Results reflect: Bone density results: NORMAL. Repeat every 5 years.  Lung Cancer Screening: (Low Dose CT Chest recommended if Age 87-80 years, 30 pack-year currently smoking OR have quit w/in 15years.) does not qualify.   Lung Cancer Screening Referral: n/a  Additional Screening:  Hepatitis C Screening: does not qualify; Completed  Vision Screening: Recommended annual ophthalmology exams for early detection of glaucoma and other disorders of the eye. Is the patient up to date with their annual eye exam?  Yes  Who is the provider or what is the name of the office in which the patient attends annual eye exams? Dr. Julian Hy in [place] If pt is not established with a provider, would they like to be referred to a provider to establish care? n/a.   Dental Screening: Recommended annual dental exams for proper oral hygiene  Community Resource Referral / Chronic Care Management: CRR required this visit?  No   CCM required this visit?  No      Plan:     I have personally reviewed and noted the following in the patient's chart:   . Medical and social history . Use of alcohol, tobacco or illicit drugs  . Current medications and supplements . Functional ability and status . Nutritional status . Physical activity . Advanced directives . List of other physicians . Hospitalizations, surgeries, and ER visits in previous 12 months . Vitals . Screenings to include cognitive, depression, and falls . Referrals and appointments  In addition, I have reviewed and discussed with patient certain preventive protocols, quality metrics, and best practice recommendations. A written  personalized care plan for preventive services as well as general preventive health recommendations were provided to patient.     Laretta Bolster, [place]   [id number]   Nurse Notes: AWV conducted by nurse in office by phone. Patient gave consent to telehealth visit via audio. Provider here in office at the time of this visit. Patient at home at the time of this visit. Visit took 30 minutes to complete.

## 2020-11-11 ENCOUNTER — Ambulatory Visit (INDEPENDENT_AMBULATORY_CARE_PROVIDER_SITE_OTHER): Payer: Medicare Other | Admitting: Internal Medicine

## 2020-11-11 ENCOUNTER — Encounter: Payer: Self-pay | Admitting: Internal Medicine

## 2020-11-11 ENCOUNTER — Other Ambulatory Visit: Payer: Self-pay

## 2020-11-11 VITALS — BP 157/71 | HR 76 | Temp 97.4°F | Ht 60.5 in | Wt 134.0 lb

## 2020-11-11 DIAGNOSIS — I10 Essential (primary) hypertension: Secondary | ICD-10-CM | POA: Diagnosis not present

## 2020-11-11 DIAGNOSIS — E1122 Type 2 diabetes mellitus with diabetic chronic kidney disease: Secondary | ICD-10-CM

## 2020-11-11 DIAGNOSIS — N1831 Chronic kidney disease, stage 3a: Secondary | ICD-10-CM

## 2020-11-11 DIAGNOSIS — Z794 Long term (current) use of insulin: Secondary | ICD-10-CM

## 2020-11-11 NOTE — Assessment & Plan Note (Signed)
Lab Results  Component Value Date   HGBA1C 7.2 (A) 06/30/2020   On Humalog 70/30 - Takes 16 U in AM and 10 U in PM Home blood glucose around 110s On ARB and statin Check BMP as last BMP showed worse kidney function Avoid nephrotoxic agents

## 2020-11-11 NOTE — Assessment & Plan Note (Addendum)
BP Readings from Last 1 Encounters:  11/11/20 (!) 157/71   Uncontrolled with Amlodipine, Losartan, Metoprolol and Spironolactone Could be elevated due to situational anxiety and/or white coat syndrome Would keep the same regimen for now If persistently elevated, will increase dose of Spironolactone Counseled for compliance with the medications Advised DASH diet and moderate exercise/walking, at least 150 mins/week

## 2020-11-11 NOTE — Patient Instructions (Signed)
Please follow low salt diet and take medications at the same time everyday.  Please get fasting blood tests done before the next visit.

## 2020-11-11 NOTE — Progress Notes (Signed)
Established Patient Office Visit  Subjective:  Patient ID: Emily Jennings, female    DOB: [birth year]  Age: 87 y.o. MRN: [id number]  CC:  Chief Complaint  Patient presents with  . Hypertension    Follow up    HPI Emily Jennings presents for follow up of HTN and DM.  BP is uncontrolled. Takes medications regularly. Patient denies headache, dizziness, chest pain, dyspnea or palpitations. She states that she was anxious today as her Yolanda Bonine had to leave for [place] and she had her medications late.  She checks her blood glucose regularly, which have been around 110s. She takes insulin regularly.   Past Medical History:  Diagnosis Date  . Allergy   . Anxiety   . ANXIETY DISORDER, GENERALIZED 07/15/2007   Qualifier: Diagnosis of  By: Rennie Plowman    . Arthritis   . Cancer [facility]) 2009   breast, carcinoma in situ left  . Carcinoma in situ of breast 05/21/2008   Qualifier: Diagnosis of  By: Moshe Cipro MD, Margaret  Diagnosed in 2009, completed 5 year course of tamoxifen, no evidence of recurrence   . Carotid stenosis    11/16/2005  mild plaque formation and stenosis proximal right ECA  . Cataract   . Complication of anesthesia   . Coronary artery disease    cardiac catheterization on 03/20/2006  LAD mid 40% stenosis, left circumflex mild 40% stenosis, RCA mid-vessel 40% to 50% lesion   EF 60%  . Diabetes mellitus   . GERD (gastroesophageal reflux disease)   . Hernia, inguinal    left  . Hyperglycemia   . Hypertension   . Insomnia 11/16/2011  . Low blood potassium   . Non-insulin dependent type 2 diabetes mellitus ([place])   . Osteoporosis   . Shortness of breath    2D Echocardiogram 01/26/2009   EF of greater than 55%, mild MR, mild TR, normal ventricular function  . Thickened endometrium 10/26/2017   Noted by gyne in 2017, missed 6 month follow up, referred in 09/2017  . Ventricular tachycardia, non-sustained (HCC)    developed during stress test 02/08/2006, spontaneously aborted,  mild reversible apical defect    Past Surgical History:  Procedure Laterality Date  . BREAST LUMPECTOMY Left 2009   Left breast 2009  . CATARACT EXTRACTION W/PHACO Left 10/28/2014   Procedure: PHACO EMULSION CATARACT EXTRACTION WITH INTRAOCULAR LENS IMPLANT LEFT EYE (IOC);  Surgeon: Marylynn Pearson, MD;  Location: [place];  Service: Ophthalmology;  Laterality: Left;  . COLONOSCOPY    . cyst removed from left foot      Family History  Problem Relation Age of Onset  . Hypertension Mother   . Hyperlipidemia Mother   . Stroke Mother   . Cancer Father        pancreatic  . Colon cancer Father   . Heart disease Brother 40       bypass  . Heart disease Brother 23       bypass  . Arthritis Other   . Asthma Other   . Diabetes Other   . Colon cancer Paternal Aunt   . Esophageal cancer Neg Hx   . Stomach cancer Neg Hx   . Rectal cancer Neg Hx     Social History   Socioeconomic History  . Marital status: Married    Spouse name: Not on file  . Number of children: 2  . Years of education: na  . Highest education level: Some college, no degree  Occupational History  .  Occupation: retired    Fish farm manager: retired  Tobacco Use  . Smoking status: Never Smoker  . Smokeless tobacco: Never Used  Vaping Use  . Vaping Use: Never used  Substance and Sexual Activity  . Alcohol use: No  . Drug use: No  . Sexual activity: Not Currently    Birth control/protection: Post-menopausal  Other Topics Concern  . Not on file  Social History Narrative   Lives with her husband and son and attends yoga at the [facility] 2 days a week and speaks to her grandson nightly on the phone   Social Determinants of Health   Financial Resource Strain: Low Risk   . Difficulty of Paying Living Expenses: Not hard at all  Food Insecurity: No Food Insecurity  . Worried About Charity fundraiser in the Last Year: Never true  . Ran Out of Food in the Last Year: Never true  Transportation Needs: No Transportation Needs  .  Lack of Transportation (Medical): No  . Lack of Transportation (Non-Medical): No  Physical Activity: Insufficiently Active  . Days of Exercise per Week: 7 days  . Minutes of Exercise per Session: 20 min  Stress: No Stress Concern Present  . Feeling of Stress : Not at all  Social Connections: Moderately Isolated  . Frequency of Communication with Friends and Family: More than three times a week  . Frequency of Social Gatherings with Friends and Family: Twice a week  . Attends Religious Services: Never  . Active Member of Clubs or Organizations: No  . Attends Archivist Meetings: Never  . Marital Status: Married  Human resources officer Violence: Not At Risk  . Fear of Current or Ex-Partner: No  . Emotionally Abused: No  . Physically Abused: No  . Sexually Abused: No    Outpatient Medications Prior to Visit  Medication Sig Dispense Refill  . acetaminophen (TYLENOL) 500 MG tablet Take one tablet two times daily for 3 days, then as needed 30 tablet 0  . alendronate (FOSAMAX) 70 MG tablet TAKE 1 TABLET BY MOUTH  EVERY 7 DAYS TAKE WITH A  FULL GLASS OF WATER ON AN  EMPTY STOMACH 12 tablet 3  . amLODipine (NORVASC) 10 MG tablet TAKE 1 TABLET BY MOUTH EVERY DAY 90 tablet 1  . aspirin EC 81 MG tablet Take 81 mg by mouth daily.    . blood glucose meter kit and supplies KIT One touch Ultra. Use three times daily as directed. (FOR ICD E11.65) 1 each 0  . cholecalciferol (VITAMIN D3) 25 MCG (1000 UT) tablet Take 1,000 Units by mouth daily.    Marland Kitchen glucose blood (ONETOUCH ULTRA) test strip Use as instructed to test blood glucose three times daily. DX E11.65 300 each 12  . insulin lispro protamine-lispro (HUMALOG MIX 75/25) (75-25) 100 UNIT/ML SUSP injection INJECT SUBCUTANEOUSLY 10  UNITS TWICE DAILY 30 mL 3  . Insulin Pen Needle 32G X 4 MM MISC 1 each by Does not apply route as directed. 100 each 3  . Insulin Syringe-Needle U-100 (BD INSULIN SYRINGE U/F) 31G X 5/16" 0.5 ML MISC USE AS DIRECTED  TWICE DAILY 100 each 3  . losartan (COZAAR) 100 MG tablet Take 1 tablet (100 mg total) by mouth daily. 30 tablet 5  . metoprolol tartrate (LOPRESSOR) 50 MG tablet Take 1 tablet (50 mg total) by mouth 2 (two) times daily. 180 tablet 1  . OneTouch Delica Lancets 78L MISC USE AS DIRECTED TO CHECK BLOOD GLUCOSE FOUR TIMES DAILY 400 each 3  .  potassium chloride SA (KLOR-CON M20) 20 MEQ tablet Take 1 tablet (20 mEq total) by mouth 2 (two) times daily. 180 tablet 1  . RESTASIS 0.05 % ophthalmic emulsion     . rosuvastatin (CRESTOR) 5 MG tablet TAKE 1 TABLET BY MOUTH EVERYDAY AT BEDTIME 90 tablet 3  . spironolactone (ALDACTONE) 25 MG tablet TAKE 1 TABLET BY MOUTH EVERY DAY 90 tablet 1   No facility-administered medications prior to visit.    Allergies  Allergen Reactions  . Benadryl [Diphenhydramine Hcl] Hypertension  . Citalopram   . Metformin And Related Diarrhea  . Tramadol Other (See Comments)    Felt light headed and dizzy  . Tylenol [Acetaminophen] Other (See Comments)    Patient stated tylenol makes her "feel high"    ROS Review of Systems  Constitutional: Negative for chills and fever.  HENT: Negative for congestion, sinus pressure, sinus pain and sore throat.   Eyes: Negative for pain and discharge.  Respiratory: Negative for cough and shortness of breath.   Cardiovascular: Negative for chest pain and palpitations.  Gastrointestinal: Negative for abdominal pain, constipation, diarrhea, nausea and vomiting.  Endocrine: Negative for polydipsia and polyuria.  Genitourinary: Negative for dysuria and hematuria.  Musculoskeletal: Negative for neck pain and neck stiffness.  Skin: Negative for rash.  Neurological: Negative for dizziness and weakness.  Psychiatric/Behavioral: Negative for agitation and behavioral problems.      Objective:    Physical Exam Vitals reviewed.  Constitutional:      General: She is not in acute distress.    Appearance: She is not diaphoretic.  HENT:      Head: Normocephalic and atraumatic.     Nose: Nose normal. No congestion.     Mouth/Throat:     Mouth: Mucous membranes are moist.     Pharynx: No posterior oropharyngeal erythema.  Eyes:     General: No scleral icterus.    Extraocular Movements: Extraocular movements intact.     Pupils: Pupils are equal, round, and reactive to light.  Cardiovascular:     Rate and Rhythm: Normal rate and regular rhythm.     Pulses: Normal pulses.     Heart sounds: Normal heart sounds. No murmur heard.   Pulmonary:     Breath sounds: Normal breath sounds. No wheezing or rales.  Abdominal:     Palpations: Abdomen is soft.     Tenderness: There is no abdominal tenderness.  Musculoskeletal:     Cervical back: Neck supple. No tenderness.     Right lower leg: No edema.     Left lower leg: No edema.  Skin:    General: Skin is warm.     Findings: No rash.  Neurological:     General: No focal deficit present.     Mental Status: She is alert and oriented to person, place, and time.  Psychiatric:        Mood and Affect: Mood normal.        Behavior: Behavior normal.     BP (!) 157/71 (BP Location: Right Arm, Patient Position: Sitting)   Pulse 76   Temp (!) 97.4 F (36.3 C) (Temporal)   Ht 5' 0.5" (1.537 m)   Wt 134 lb (60.8 kg)   SpO2 98%   BMI 25.74 kg/m  Wt Readings from Last 3 Encounters:  11/11/20 134 lb (60.8 kg)  11/10/20 131 lb (59.4 kg)  10/14/20 131 lb (59.4 kg)     Health Maintenance Due  Topic Date Due  . OPHTHALMOLOGY  EXAM  04/12/2018    There are no preventive care reminders to display for this patient.  Lab Results  Component Value Date   TSH 1.29 06/29/2020   Lab Results  Component Value Date   WBC 5.9 03/24/2020   HGB 12.8 03/24/2020   HCT 39.3 03/24/2020   MCV 92.9 03/24/2020   PLT 239 03/24/2020   Lab Results  Component Value Date   NA 142 06/29/2020   K 4.1 06/29/2020   CO2 30 06/29/2020   GLUCOSE 126 (H) 06/29/2020   BUN 27 (H) 06/29/2020    CREATININE 1.24 (H) 06/29/2020   BILITOT 0.5 06/29/2020   ALKPHOS 64 08/29/2017   AST 26 06/29/2020   ALT 16 06/29/2020   PROT 6.2 06/29/2020   ALBUMIN 3.9 08/29/2017   CALCIUM 9.1 06/29/2020   ANIONGAP 8 08/29/2017   Lab Results  Component Value Date   CHOL 207 (H) 06/29/2020   Lab Results  Component Value Date   HDL 115 06/29/2020   Lab Results  Component Value Date   LDLCALC 80 06/29/2020   Lab Results  Component Value Date   TRIG 50 06/29/2020   Lab Results  Component Value Date   CHOLHDL 1.8 06/29/2020   Lab Results  Component Value Date   HGBA1C 7.2 (A) 06/30/2020      Assessment & Plan:   Problem List Items Addressed This Visit      Cardiovascular and Mediastinum   Essential hypertension - Primary    BP Readings from Last 1 Encounters:  11/11/20 (!) 157/71   Uncontrolled with Amlodipine, Losartan, Metoprolol and Spironolactone Could be elevated due to situational anxiety and/or white coat syndrome Would keep the same regimen for now If persistently elevated, will increase dose of Spironolactone Counseled for compliance with the medications Advised DASH diet and moderate exercise/walking, at least 150 mins/week          Endocrine   Type 2 diabetes mellitus with stage 3a chronic kidney disease, with long-term current use of insulin (HCC)    Lab Results  Component Value Date   HGBA1C 7.2 (A) 06/30/2020   On Humalog 70/30 - Takes 16 U in AM and 10 U in PM Home blood glucose around 110s On ARB and statin Check BMP as last BMP showed worse kidney function Avoid nephrotoxic agents      Relevant Orders   Basic Metabolic Panel (BMET)      No orders of the defined types were placed in this encounter.   Follow-up: Return in about 4 weeks (around 12/09/2020) for HTN and blood tests review.    Lindell Spar, MD

## 2020-11-17 ENCOUNTER — Other Ambulatory Visit: Payer: Self-pay

## 2020-11-17 ENCOUNTER — Ambulatory Visit: Payer: Medicare Other | Admitting: Nurse Practitioner

## 2020-11-17 ENCOUNTER — Encounter: Payer: Self-pay | Admitting: Nurse Practitioner

## 2020-11-17 VITALS — BP 170/82 | HR 66 | Ht 60.5 in | Wt 134.2 lb

## 2020-11-17 DIAGNOSIS — N1831 Chronic kidney disease, stage 3a: Secondary | ICD-10-CM | POA: Diagnosis not present

## 2020-11-17 DIAGNOSIS — Z794 Long term (current) use of insulin: Secondary | ICD-10-CM

## 2020-11-17 DIAGNOSIS — I10 Essential (primary) hypertension: Secondary | ICD-10-CM

## 2020-11-17 DIAGNOSIS — E1122 Type 2 diabetes mellitus with diabetic chronic kidney disease: Secondary | ICD-10-CM

## 2020-11-17 DIAGNOSIS — E782 Mixed hyperlipidemia: Secondary | ICD-10-CM

## 2020-11-17 LAB — POCT UA - MICROALBUMIN
Creatinine, POC: 50 mg/dL
Microalbumin Ur, POC: 150 mg/L

## 2020-11-17 LAB — POCT GLYCOSYLATED HEMOGLOBIN (HGB A1C): HbA1c, POC (controlled diabetic range): 7.1 % — AB (ref 0.0–7.0)

## 2020-11-17 NOTE — Progress Notes (Signed)
11/17/2020  Endocrinology follow-up note   Subjective:    Patient ID: Emily Jennings, female    DOB: 28-Jul-1934,    Past Medical History:  Diagnosis Date  . Allergy   . Anxiety   . ANXIETY DISORDER, GENERALIZED 07/15/2007   Qualifier: Diagnosis of  By: Rennie Plowman    . Arthritis   . Cancer [facility]) 2009   breast, carcinoma in situ left  . Carcinoma in situ of breast 05/21/2008   Qualifier: Diagnosis of  By: Moshe Cipro MD, Margaret  Diagnosed in 2009, completed 5 year course of tamoxifen, no evidence of recurrence   . Carotid stenosis    11/16/2005  mild plaque formation and stenosis proximal right ECA  . Cataract   . Complication of anesthesia   . Coronary artery disease    cardiac catheterization on 03/20/2006  LAD mid 40% stenosis, left circumflex mild 40% stenosis, RCA mid-vessel 40% to 50% lesion   EF 60%  . Diabetes mellitus   . GERD (gastroesophageal reflux disease)   . Hernia, inguinal    left  . Hyperglycemia   . Hypertension   . Insomnia 11/16/2011  . Low blood potassium   . Non-insulin dependent type 2 diabetes mellitus ([place])   . Osteoporosis   . Shortness of breath    2D Echocardiogram 01/26/2009   EF of greater than 55%, mild MR, mild TR, normal ventricular function  . Thickened endometrium 10/26/2017   Noted by gyne in 2017, missed 6 month follow up, referred in 09/2017  . Ventricular tachycardia, non-sustained (HCC)    developed during stress test 02/08/2006, spontaneously aborted, mild reversible apical defect   Past Surgical History:  Procedure Laterality Date  . BREAST LUMPECTOMY Left 2009   Left breast 2009  . CATARACT EXTRACTION W/PHACO Left 10/28/2014   Procedure: PHACO EMULSION CATARACT EXTRACTION WITH INTRAOCULAR LENS IMPLANT LEFT EYE (IOC);  Surgeon: Marylynn Pearson, MD;  Location: [facility];  Service: Ophthalmology;  Laterality: Left;  . COLONOSCOPY    . cyst removed from left foot     Social History   Socioeconomic History  . Marital status: Married     Spouse name: Not on file  . Number of children: 2  . Years of education: na  . Highest education level: Some college, no degree  Occupational History  . Occupation: retired    Fish farm manager: retired  Tobacco Use  . Smoking status: Never Smoker  . Smokeless tobacco: Never Used  Vaping Use  . Vaping Use: Never used  Substance and Sexual Activity  . Alcohol use: No  . Drug use: No  . Sexual activity: Not Currently    Birth control/protection: Post-menopausal  Other Topics Concern  . Not on file  Social History Narrative   Lives with her husband and son and attends yoga at the [facility] 2 days a week and speaks to her grandson nightly on the phone   Social Determinants of Health   Financial Resource Strain: Low Risk   . Difficulty of Paying Living Expenses: Not hard at all  Food Insecurity: No Food Insecurity  . Worried About Charity fundraiser in the Last Year: Never true  . Ran Out of Food in the Last Year: Never true  Transportation Needs: No Transportation Needs  . Lack of Transportation (Medical): No  . Lack of Transportation (Non-Medical): No  Physical Activity: Insufficiently Active  . Days of Exercise per Week: 7 days  . Minutes of Exercise per Session: 20 min  Stress: No  Stress Concern Present  . Feeling of Stress : Not at all  Social Connections: Moderately Isolated  . Frequency of Communication with Friends and Family: More than three times a week  . Frequency of Social Gatherings with Friends and Family: Twice a week  . Attends Religious Services: Never  . Active Member of Clubs or Organizations: No  . Attends Archivist Meetings: Never  . Marital Status: Married   Outpatient Encounter Medications as of 11/17/2020  Medication Sig  . acetaminophen (TYLENOL) 500 MG tablet Take one tablet two times daily for 3 days, then as needed  . alendronate (FOSAMAX) 70 MG tablet TAKE 1 TABLET BY MOUTH  EVERY 7 DAYS TAKE WITH A  FULL GLASS OF WATER ON AN  EMPTY STOMACH  .  amLODipine (NORVASC) 10 MG tablet TAKE 1 TABLET BY MOUTH EVERY DAY  . aspirin EC 81 MG tablet Take 81 mg by mouth daily.  . blood glucose meter kit and supplies KIT One touch Ultra. Use three times daily as directed. (FOR ICD E11.65)  . cholecalciferol (VITAMIN D3) 25 MCG (1000 UT) tablet Take 1,000 Units by mouth daily.  Marland Kitchen glucose blood (ONETOUCH ULTRA) test strip Use as instructed to test blood glucose three times daily. DX E11.65  . insulin lispro protamine-lispro (HUMALOG MIX 75/25) (75-25) 100 UNIT/ML SUSP injection INJECT SUBCUTANEOUSLY 10  UNITS TWICE DAILY (Patient taking differently: INJECT SUBCUTANEOUSLY 15  UNITS in AM 10 unit at [employer])  . Insulin Pen Needle 32G X 4 MM MISC 1 each by Does not apply route as directed.  . Insulin Syringe-Needle U-100 (BD INSULIN SYRINGE U/F) 31G X 5/16" 0.5 ML MISC USE AS DIRECTED TWICE DAILY  . losartan (COZAAR) 100 MG tablet Take 1 tablet (100 mg total) by mouth daily.  . metoprolol tartrate (LOPRESSOR) 50 MG tablet Take 1 tablet (50 mg total) by mouth 2 (two) times daily.  . mupirocin ointment (BACTROBAN) 2 % Apply topically 2 (two) times daily.  Glory Rosebush Delica Lancets 12R MISC USE AS DIRECTED TO CHECK BLOOD GLUCOSE FOUR TIMES DAILY  . potassium chloride SA (KLOR-CON M20) 20 MEQ tablet Take 1 tablet (20 mEq total) by mouth 2 (two) times daily.  . RESTASIS 0.05 % ophthalmic emulsion   . rosuvastatin (CRESTOR) 5 MG tablet TAKE 1 TABLET BY MOUTH EVERYDAY AT BEDTIME  . spironolactone (ALDACTONE) 25 MG tablet TAKE 1 TABLET BY MOUTH EVERY DAY   No facility-administered encounter medications on file as of 11/17/2020.   ALLERGIES: Allergies  Allergen Reactions  . Benadryl [Diphenhydramine Hcl] Hypertension  . Citalopram   . Metformin And Related Diarrhea  . Tramadol Other (See Comments)    Felt light headed and dizzy  . Tylenol [Acetaminophen] Other (See Comments)    Patient stated tylenol makes her "feel high"   VACCINATION STATUS: Immunization  History  Administered Date(s) Administered  . Fluad Quad(high Dose 65+) 06/02/2019, 05/27/2020  . Influenza Split 07/17/2011  . Influenza Whole 06/18/2007, 07/29/2009, 05/16/2013  . Influenza, High Dose Seasonal PF 08/13/2018  . Influenza,inj,Quad PF,6+ Mos 05/19/2014, 08/22/2016, 05/17/2017  . Moderna Sars-Covid-2 Vaccination 10/26/2019, 11/26/2019, 07/29/2020  . Pneumococcal Conjugate-13 03/24/2014  . Pneumococcal Polysaccharide-23 05/20/2008  . Td 06/18/2002  . Tdap 07/17/2011    Diabetes She presents for her follow-up diabetic visit. She has type 2 diabetes mellitus. Onset time: She was diagnosed at approximate age of 75 years. Her disease course has been stable. There are no hypoglycemic associated symptoms. Pertinent negatives for hypoglycemia include no confusion,  headaches, pallor or seizures. There are no diabetic associated symptoms. Pertinent negatives for diabetes include no chest pain, no fatigue, no polydipsia, no polyphagia and no polyuria. There are no hypoglycemic complications. Symptoms are stable. Diabetic complications include heart disease and nephropathy. Risk factors for coronary artery disease include diabetes mellitus, dyslipidemia, hypertension, sedentary lifestyle and post-menopausal. Current diabetic treatment includes insulin injections. She is compliant with treatment most of the time. Her weight is fluctuating minimally. She is following a generally unhealthy diet. When asked about meal planning, she reported none. She has had a previous visit with a dietitian. She never participates in exercise. Her home blood glucose trend is fluctuating minimally. Her overall blood glucose range is 140-180 mg/dl. (She presents today with her meter, no logs, showing continued fluctuating glycemic profile. Her POCT A1c today is 7.1%, essentially unchanged from previous visit.  She does have significant hypoglycemia usually in the early afternoon hours.  She adjusts her doses of premixed  insulin based on her glucose readings.  She also did not stop taking her Glipizide as recommended at last visit.  ) An ACE inhibitor/angiotensin II receptor blocker is being taken. She does not see a podiatrist.Eye exam is current.  Hyperlipidemia This is a chronic problem. The current episode started more than 1 year ago. The problem is controlled. Recent lipid tests were reviewed and are normal. Exacerbating diseases include chronic renal disease and diabetes. Factors aggravating her hyperlipidemia include beta blockers and fatty foods. Pertinent negatives include no chest pain, myalgias or shortness of breath. Current antihyperlipidemic treatment includes statins. The current treatment provides moderate improvement of lipids. Compliance problems include adherence to diet and adherence to exercise.  Risk factors for coronary artery disease include diabetes mellitus, dyslipidemia, a sedentary lifestyle, hypertension and post-menopausal.  Hypertension This is a chronic problem. The current episode started more than 1 year ago. The problem has been waxing and waning since onset. The problem is uncontrolled. Pertinent negatives include no chest pain, headaches, palpitations or shortness of breath. Risk factors for coronary artery disease include diabetes mellitus, dyslipidemia, sedentary lifestyle and post-menopausal state. Past treatments include angiotensin blockers, calcium channel blockers, beta blockers and diuretics. The current treatment provides moderate improvement. There are no compliance problems.  Hypertensive end-organ damage includes kidney disease and CAD/MI. Identifiable causes of hypertension include chronic renal disease.    Review of systems  Constitutional: + Minimally fluctuating body weight,  current Body mass index is 25.78 kg/m. , no fatigue, no subjective hyperthermia, no subjective hypothermia Eyes: no blurry vision, no xerophthalmia ENT: no sore throat, no nodules palpated in  throat, no dysphagia/odynophagia, no hoarseness Cardiovascular: no chest pain, no shortness of breath, no palpitations, no leg swelling Respiratory: no cough, no shortness of breath Gastrointestinal: no nausea/vomiting/diarrhea Musculoskeletal: no muscle/joint aches Skin: no rashes, no hyperemia Neurological: no tremors, no numbness, no tingling, no dizziness Psychiatric: no depression, no anxiety   Objective:    BP (!) 170/82 (BP Location: Right Arm, Patient Position: Sitting)   Pulse 66   Ht 5' 0.5" (1.537 m)   Wt 134 lb 3.2 oz (60.9 kg)   BMI 25.78 kg/m   Wt Readings from Last 3 Encounters:  11/17/20 134 lb 3.2 oz (60.9 kg)  11/11/20 134 lb (60.8 kg)  11/10/20 131 lb (59.4 kg)    BP Readings from Last 3 Encounters:  11/17/20 (!) 170/82  11/11/20 (!) 157/71  08/27/20 (!) 187/78    Physical Exam- Limited  Constitutional:  Body mass index is 25.78 kg/m. ,  not in acute distress, mildly anxious state of mind, forgetful at times Eyes:  EOMI, no exophthalmos Neck: Supple Cardiovascular: RRR, no murmers, rubs, or gallops, no edema Respiratory: Adequate breathing efforts, no crackles, rales, rhonchi, or wheezing Musculoskeletal: + heberden's nodes in bilateral fingers, strength intact in all four extremities, no gross restriction of joint movements Skin:  no rashes, no hyperemia Neurological: no tremor with outstretched hands    Results for orders placed or performed in visit on 11/17/20  HgB A1c  Result Value Ref Range   Hemoglobin A1C     HbA1c POC (<> result, manual entry)     HbA1c, POC (prediabetic range)     HbA1c, POC (controlled diabetic range) 7.1 (A) 0.0 - 7.0 %  POCT UA - Microalbumin  Result Value Ref Range   Microalbumin Ur, POC 150 mg/L   Creatinine, POC 50 mg/dL   Albumin/Creatinine Ratio, Urine, POC     Complete Blood Count (Most recent): Lab Results  Component Value Date   WBC 5.9 03/24/2020   HGB 12.8 03/24/2020   HCT 39.3 03/24/2020   MCV  92.9 03/24/2020   PLT 239 03/24/2020   Chemistry (most recent): Lab Results  Component Value Date   NA 142 06/29/2020   K 4.1 06/29/2020   CL 105 06/29/2020   CO2 30 06/29/2020   BUN 27 (H) 06/29/2020   CREATININE 1.24 (H) 06/29/2020   Diabetic Labs (most recent): Lab Results  Component Value Date   HGBA1C 7.1 (A) 11/17/2020   HGBA1C 7.2 (A) 06/30/2020   HGBA1C 7.2 (A) 02/25/2020   Lipid Panel     Component Value Date/Time   CHOL 207 (H) 06/29/2020 0916   TRIG 50 06/29/2020 0916   HDL 115 06/29/2020 0916   CHOLHDL 1.8 06/29/2020 0916   VLDL 8 10/31/2016 0845   LDLCALC 80 06/29/2020 0916     Assessment & Plan:   1) Type 2 diabetes mellitus with stage 3 chronic kidney disease, with long-term current use of insulin   She presents today with her meter, no logs, showing continued fluctuating glycemic profile. Her POCT A1c today is 7.1%, essentially unchanged from previous visit.  She does have significant hypoglycemia usually in the early afternoon hours.  She adjusts her doses of premixed insulin based on her glucose readings.  She also did not stop taking her Glipizide as recommended at last visit.      Her recent labs were reviewed with her.    Patient remains at a high risk for more acute and chronic complications of diabetes which include CAD, CVA, CKD, retinopathy, and neuropathy. These are all discussed in detail with the patient.  - Nutritional counseling repeated at each appointment due to patients tendency to fall back in to old habits.  - The patient admits there is a room for improvement in their diet and drink choices. -  Suggestion is made for the patient to avoid simple carbohydrates from their diet including Cakes, Sweet Desserts / Pastries, Ice Cream, Soda (diet and regular), Sweet Tea, Candies, Chips, Cookies, Sweet Pastries,  Store Bought Juices, Alcohol in Excess of  1-2 drinks a day, Artificial Sweeteners, Coffee Creamer, and "Sugar-free" Products. This  will help patient to have stable blood glucose profile and potentially avoid unintended weight gain.   - I encouraged the patient to switch to  unprocessed or minimally processed complex starch and increased protein intake (animal or plant source), fruits, and vegetables.   - Patient is advised to stick to a  routine mealtimes to eat 3 meals  a day and avoid unnecessary snacks ( to snack only to correct hypoglycemia).  I have approached patient to continue on  intensive monitoring of blood glucose and insulin therapy, and patient agrees.   - She continued to do fairly well on a simplified treatment with premixed insulin. Avoiding hypoglycemia is the #1 priority in her case, she does have good support with husband and adult son at home.   -Based on her postprandial hypoglycemia, she is advised to stop the Glipizide again.  I explained to her that low glucose is much more unsafe than high glucose at this time.  I also recommended she lower her dose of Humalog 75/25 back to 10 units in the morning with breakfast and 10 units with supper if her glucose is above 90 and she is eating.  She was very resistant to this change and I suspect she will continue to alter her doses based on glucose readings.    -She is encouraged to continue monitoring blood glucose at least 3 times per day, before injecting insulin at breakfast and supper, and before bed.  She is instructed to call the clinic if she has glucose readings less than 70 or greater than 200 for 3 tests in a row.    - She is warned not to inject insulin without proper monitoring of blood glucose.  -Target numbers for A1c, LDL, HDL, Triglycerides, Waist Circumference were discussed in detail.  2) HTN:  Her blood pressure is not controlled to target.  She is advised to continue Amlodipine 10 mg po daily, Losartan 50 mg po daily, Metoprolol 50 mg po twice daily, and Spironolactone 25 mg po daily.  May need to adjust dose at next visit if BP remains  elevated.  3) Lipids:  Her most recent lipid panel from 06/29/20 shows controlled LDL of 80.  She is advised to continue Crestor 5 mg po daily at bedtime and Lovaza 1 gm po twice daily.  Side effects and precautions discussed with patient.  4)  Weight/Diet:  Her Body mass index is 25.78 kg/m.-not a candidate for weight loss.  CDE consult in progress, exercise, and carbs information provided.   5) Chronic Care: -Patient is on ARB medications and Statins and encouraged to continue to follow up with Ophthalmology, Podiatrist at least yearly or according to recommendations, and advised to stay away from smoking. I have recommended yearly flu vaccine and pneumonia vaccination at least every 5 years; and  sleep for at least 7 hours a day.   She is advised to maintain close follow-up with her PCP:  [name], MD  - Time spent on this patient care encounter:  40 min, of which > 50% was spent in  counseling and the rest reviewing her blood glucose logs , discussing her hypoglycemia and hyperglycemia episodes, reviewing her current and  previous labs / studies  ( including abstraction from other facilities) and medications  doses and developing a  long term treatment plan and documenting her care.   Please refer to Patient Instructions for Blood Glucose Monitoring and Insulin/Medications Dosing Guide"  in media tab for additional information. Please  also refer to " Patient Self Inventory" in the Media  tab for reviewed elements of pertinent patient history.  Alli Hewitt Shorts participated in the discussions, expressed understanding, and voiced agreement with the above plans.  All questions were answered to her satisfaction. she is encouraged to contact clinic should she have any questions  or concerns prior to her return visit.  Follow up plan: Return in about 4 months (around 03/19/2021) for Diabetes follow up with A1c in office, No previsit labs, Bring glucometer and logs.  Rayetta Pigg,  [facility] [address] [place], [place] [address] Phone: [phone] Fax: [phone]  11/17/2020, 3:46 PM

## 2020-11-17 NOTE — Patient Instructions (Signed)

## 2020-11-19 ENCOUNTER — Inpatient Hospital Stay (HOSPITAL_COMMUNITY): Admission: RE | Admit: 2020-11-19 | Payer: Medicare Other | Source: Ambulatory Visit

## 2020-11-19 ENCOUNTER — Ambulatory Visit (HOSPITAL_COMMUNITY): Payer: Medicare Other

## 2020-11-26 ENCOUNTER — Ambulatory Visit: Payer: Medicare Other | Admitting: Allergy & Immunology

## 2020-11-26 ENCOUNTER — Encounter: Payer: Self-pay | Admitting: Allergy & Immunology

## 2020-11-26 ENCOUNTER — Other Ambulatory Visit: Payer: Self-pay

## 2020-11-26 VITALS — BP 140/90 | HR 73 | Temp 97.9°F | Resp 16 | Ht 61.0 in | Wt 133.6 lb

## 2020-11-26 DIAGNOSIS — R220 Localized swelling, mass and lump, head: Secondary | ICD-10-CM

## 2020-11-26 DIAGNOSIS — T783XXD Angioneurotic edema, subsequent encounter: Secondary | ICD-10-CM

## 2020-11-26 DIAGNOSIS — R067 Sneezing: Secondary | ICD-10-CM | POA: Diagnosis not present

## 2020-11-26 MED ORDER — FAMOTIDINE 20 MG PO TABS: 20.0000 mg | ORAL_TABLET | Freq: Two times a day (BID) | ORAL | 5 refills | Status: DC

## 2020-11-26 NOTE — Patient Instructions (Addendum)
1. Lip swelling - Your history does not have any "red flags" such as fevers, joint pains, or permanent skin changes that would be concerning for a more serious cause of swelling.  - We will get some labs to rule out serious causes of hives: complete blood count, tryptase level, chronic urticaria panel, CMP, ESR, and CRP. - Chronic swelling is often times a self limited process and will "burn out" over 6-12 months, although this is not always the case.  - In the meantime, start suppressive dosing of antihistamines:   - Morning:  Zyrtec (cetirizine) 91m +  Pepcid (famotidine) 263m - Evening:  Zyrtec (cetirizine) 1058m Pepcid (famotidine) 6m48m. Sneezing - We are going to get some environmental allergy testing via the blood to see if we cna find evidence of allergies. - We are starting the cetirizine as above. - We will call you in 1-2 weeks with the results of the testing.   3. Return in about 3 months (around 02/26/2021).    Please inform us o[place]oreaany Emergency Department visits, hospitalizations, or changes in symptoms. Call us bKoreaore going to the ED for breathing or allergy symptoms since we might be able to fit you in for a sick visit. Feel free to contact us aKoreatime with any questions, problems, or concerns.  It was a pleasure to meet you today!  Websites that have reliable patient information: 1. American Academy of Asthma, Allergy, and Immunology: www.aaaai.org 2. Food Allergy Research and Education (FARE): foodallergy.org 3. Mothers of Asthmatics: http://www.asthmacommunitynetwork.org 4. American College of Allergy, Asthma, and Immunology: www.acaai.org   COVID-19 Vaccine Information can be found at: [email or web address] questions related to vaccine distribution or appointments, please email [email or web address] or call [phone] realize that you might be concerned about having an allergic reaction to the  COVID19 vaccines. To help with that concern, WE ARE OFFERING THE COVID19 VACCINES IN OUR OFFICE! Ask the front desk for dates!     "Like" us oKoreaFacebook and Instagram for our latest updates!      A healthy democracy works best when ALL [employer] [place] [employer]ur[employer]ate! Make sure you are registered to vote! If you have moved or changed any of your contact information, you will need to get this updated before voting!  In some cases, you MAY be able to register to vote online: [email or web address]

## 2020-11-26 NOTE — Progress Notes (Signed)
NEW PATIENT  Date of Service/Encounter:  11/26/20  Referring provider: [name], MD   Assessment:   Lip swelling   Sneezing   Angioedema  Plan/Recommendations:   1. Lip swelling - Your history does not have any "red flags" such as fevers, joint pains, or permanent skin changes that would be concerning for a more serious cause of swelling.  - We will get some labs to rule out serious causes of hives: complete blood count, tryptase level, chronic urticaria panel, CMP, ESR, and CRP. - Chronic swelling is often times a self limited process and will "burn out" over 6-12 months, although this is not always the case.  - In the meantime, start suppressive dosing of antihistamines:   - Morning:  Zyrtec (cetirizine) 93m +  Pepcid (famotidine) 2110m - Evening:  Zyrtec (cetirizine) 1038m Pepcid (famotidine) 43m71m. Sneezing - We are going to get some environmental allergy testing via the blood to see if we cna find evidence of allergies. - We are starting the cetirizine as above. - We will call you in 1-2 weeks with the results of the testing.   3. Return in about 3 months (around 02/26/2021).    Subjective:   Emily GARROWa 86 y40. female presenting today for evaluation of  Chief Complaint  Patient presents with  . Angioedema  . Allergic Rhinitis     Emily Jennings a history of the following: Patient Active Problem List   Diagnosis Date Noted  . Lip swelling 10/30/2020  . Right leg pain 10/30/2020  . Swelling 08/27/2020  . Tubular adenoma of colon 10/26/2017  . Osteoporosis 07/28/2014  . Diabetic hypertension-nephrosis syndrome (HCC)[place]/02/2014  . CKD (chronic kidney disease) stage 3, GFR 30-59 ml/min (HCC) 03/23/2014  . Type 2 diabetes mellitus with stage 3a chronic kidney disease, with long-term current use of insulin (HCC)[place]/08/2014  . Allergic rhinitis 01/27/2013  . GERD (gastroesophageal reflux disease) 01/12/2013  . CVD (cardiovascular  disease) 07/17/2011  . Mixed hyperlipidemia 07/15/2007  . Essential hypertension 07/15/2007    History obtained from: chart review and patient.  Emily Jennings referred by SimpFayrene Helper.     Emily Jennings 86 y19. female presenting for an evaluation of lip swelling.  She presents for evaluation of lip swelling which has been ongoing for about 1 year.  Her history is rather vague.  She reports that she has swelling all the time and her lips.  And never progresses and her throat and never encompasses any GI symptoms, hives, or irritability.  She thinks it has something to do with spider bites, although she does not really remember a particular spider bite leading to this reaction.  She has no itching with this.  She never went to the emergency room.  She has been told to take Benadryl for it, but ironically Benadryl seem to make it worse.  It does not seem to happen with any particular food.  She tolerates all the major food allergens without adverse event.  She reports that her lips are very swollen every morning.  There was no change in her medication associated with the initiation of the lip swelling.  She has been on the same set of medications for around 1 year, although within the last 2 years her blood pressure medication was increased.  She is currently on amlodipine, metoprolol, and spironolactone.  She is not on an ACE inhibitor from what I can conceive  in the chart.  She does report some sneezing that is worse during the pollen season.  Allergy tested and has never been on shots.  She does not really take much for it.  Otherwise, there is no history of other atopic diseases, including asthma, food allergies, drug allergies, stinging insect allergies, eczema or contact dermatitis. There is no significant infectious history. Vaccinations are up to date.    Past Medical History: Patient Active Problem List   Diagnosis Date Noted  . Lip swelling 10/30/2020  . Right leg pain  10/30/2020  . Swelling 08/27/2020  . Tubular adenoma of colon 10/26/2017  . Osteoporosis 07/28/2014  . Diabetic hypertension-nephrosis syndrome ([place]) 03/23/2014  . CKD (chronic kidney disease) stage 3, GFR 30-59 ml/min (HCC) 03/23/2014  . Type 2 diabetes mellitus with stage 3a chronic kidney disease, with long-term current use of insulin ([place]) 09/29/2013  . Allergic rhinitis 01/27/2013  . GERD (gastroesophageal reflux disease) 01/12/2013  . CVD (cardiovascular disease) 07/17/2011  . Mixed hyperlipidemia 07/15/2007  . Essential hypertension 07/15/2007    Medication List:  Allergies as of 11/26/2020      Reactions   Benadryl [diphenhydramine Hcl] Hypertension   Citalopram    Metformin And Related Diarrhea   Tramadol Other (See Comments)   Felt light headed and dizzy   Tylenol [acetaminophen] Other (See Comments)   Patient stated tylenol makes her "feel high"      Medication List       Accurate as of November 26, 2020 11:59 PM. If you have any questions, ask your nurse or doctor.        acetaminophen 500 MG tablet Commonly known as: TYLENOL Take one tablet two times daily for 3 days, then as needed   alendronate 70 MG tablet Commonly known as: FOSAMAX TAKE 1 TABLET BY MOUTH  EVERY 7 DAYS TAKE WITH A  FULL GLASS OF WATER ON AN  EMPTY STOMACH   amLODipine 10 MG tablet Commonly known as: NORVASC TAKE 1 TABLET BY MOUTH EVERY DAY   aspirin EC 81 MG tablet Take 81 mg by mouth daily.   blood glucose meter kit and supplies Kit One touch Ultra. Use three times daily as directed. (FOR ICD E11.65)   cholecalciferol 25 MCG (1000 UNIT) tablet Commonly known as: VITAMIN D3 Take 1,000 Units by mouth daily.   famotidine 20 MG tablet Commonly known as: Pepcid Take 1 tablet (20 mg total) by mouth 2 (two) times daily. Started by: Valentina Shaggy, MD   glipiZIDE 10 MG 24 hr tablet Commonly known as: GLUCOTROL XL Take 10 mg by mouth daily with breakfast.   HumaLOG Mix 75/25  (75-25) 100 UNIT/ML Susp injection Generic drug: insulin lispro protamine-lispro INJECT SUBCUTANEOUSLY 10  UNITS TWICE DAILY What changed: additional instructions   Insulin Pen Needle 32G X 4 MM Misc 1 each by Does not apply route as directed.   Insulin Syringe-Needle U-100 31G X 5/16" 0.5 ML Misc Commonly known as: BD Insulin Syringe U/F USE AS DIRECTED TWICE DAILY   losartan 100 MG tablet Commonly known as: COZAAR Take 1 tablet (100 mg total) by mouth daily.   metoprolol tartrate 50 MG tablet Commonly known as: LOPRESSOR Take 1 tablet (50 mg total) by mouth 2 (two) times daily.   mupirocin ointment 2 % Commonly known as: BACTROBAN Apply topically 2 (two) times daily.   OneTouch Delica Lancets 23R Misc USE AS DIRECTED TO CHECK BLOOD GLUCOSE FOUR TIMES DAILY   OneTouch Ultra test strip Generic drug:  glucose blood Use as instructed to test blood glucose three times daily. DX E11.65   potassium chloride SA 20 MEQ tablet Commonly known as: Klor-Con M20 Take 1 tablet (20 mEq total) by mouth 2 (two) times daily.   Restasis 0.05 % ophthalmic emulsion Generic drug: cycloSPORINE   rosuvastatin 5 MG tablet Commonly known as: CRESTOR TAKE 1 TABLET BY MOUTH EVERYDAY AT BEDTIME   spironolactone 25 MG tablet Commonly known as: ALDACTONE TAKE 1 TABLET BY MOUTH EVERY DAY       Birth History: non-contributory  Developmental History: non-contributory  Past Surgical History: Past Surgical History:  Procedure Laterality Date  . BREAST LUMPECTOMY Left 2009   Left breast 2009  . CATARACT EXTRACTION W/PHACO Left 10/28/2014   Procedure: PHACO EMULSION CATARACT EXTRACTION WITH INTRAOCULAR LENS IMPLANT LEFT EYE (IOC);  Surgeon: Marylynn Pearson, MD;  Location: [facility];  Service: Ophthalmology;  Laterality: Left;  . COLONOSCOPY    . cyst removed from left foot       Family History: Family History  Problem Relation Age of Onset  . Hypertension Mother   . Hyperlipidemia Mother   .  Stroke Mother   . Urticaria Mother   . Cancer Father        pancreatic  . Colon cancer Father   . Heart disease Brother 40       bypass  . Heart disease Brother 2       bypass  . Arthritis Other   . Asthma Other   . Diabetes Other   . Colon cancer Paternal Aunt   . Esophageal cancer Neg Hx   . Stomach cancer Neg Hx   . Rectal cancer Neg Hx      Social History: Kesha lives at home in a house that is 85 years old.  There is tile flooring throughout the home.  She has heat pump for heating and cooling.  She does have some ceiling fans.  There is a dog outside of the home.  There are no dust mite covers on the bedding.  There is no tobacco exposure.  She is currently retired.  She is not exposed to fumes, chemicals, or dust.  She does not have a HEPA filter in the home.   Review of Systems  Constitutional: Negative.  Negative for chills, fever, malaise/fatigue and weight loss.  HENT: Negative.  Negative for congestion, ear discharge and ear pain.   Eyes: Negative for pain, discharge and redness.  Respiratory: Negative for cough, sputum production, shortness of breath and wheezing.   Cardiovascular: Negative.  Negative for chest pain and palpitations.  Gastrointestinal: Negative for abdominal pain, constipation, diarrhea, heartburn, nausea and vomiting.  Skin: Negative.  Negative for itching and rash.       Positive for lip angioedema.  Neurological: Negative for dizziness and headaches.  Endo/Heme/Allergies: Negative for environmental allergies. Does not bruise/bleed easily.       Objective:   Blood pressure 140/90, pulse 73, temperature 97.9 F (36.6 C), temperature source Temporal, resp. rate 16, height _0  (1.549 m), weight 133 lb 9.6 oz (60.6 kg), SpO2 97 %. Body mass index is 25.24 kg/m.   Physical Exam:   Physical Exam Constitutional:      Appearance: She is well-developed.  HENT:     Head: Normocephalic and atraumatic.     Right Ear: Tympanic membrane, ear  canal and external ear normal. No drainage, swelling or tenderness. Tympanic membrane is not injected, scarred, erythematous, retracted or bulging.  Left Ear: Tympanic membrane, ear canal and external ear normal. No drainage, swelling or tenderness. Tympanic membrane is not injected, scarred, erythematous, retracted or bulging.     Nose: No nasal deformity, septal deviation, mucosal edema or rhinorrhea.     Right Sinus: No maxillary sinus tenderness or frontal sinus tenderness.     Left Sinus: No maxillary sinus tenderness or frontal sinus tenderness.     Comments: There is some clear rhinorrhea.    Mouth/Throat:     Lips: Pink.     Mouth: Mucous membranes are moist. Mucous membranes are not pale and not dry.     Pharynx: Uvula midline.     Comments: I do not appreciate much in the way of lip swelling today.  She does not have any pictures to show me of when it gets particularly bad. Eyes:     General:        Right eye: No discharge.        Left eye: No discharge.     Conjunctiva/sclera: Conjunctivae normal.     Right eye: Right conjunctiva is not injected. No chemosis.    Left eye: Left conjunctiva is not injected. No chemosis.    Pupils: Pupils are equal, round, and reactive to light.  Cardiovascular:     Rate and Rhythm: Normal rate and regular rhythm.     Heart sounds: Normal heart sounds.  Pulmonary:     Effort: Pulmonary effort is normal. No tachypnea, accessory muscle usage or respiratory distress.     Breath sounds: Normal breath sounds. No wheezing, rhonchi or rales.  Chest:     Chest wall: No tenderness.  Abdominal:     Tenderness: There is no abdominal tenderness. There is no guarding or rebound.  Lymphadenopathy:     Head:     Right side of head: No submandibular, tonsillar or occipital adenopathy.     Left side of head: No submandibular, tonsillar or occipital adenopathy.     Cervical: No cervical adenopathy.  Skin:    Coloration: Skin is not pale.     Findings: No  abrasion, erythema, petechiae or rash. Rash is not papular, urticarial or vesicular.  Neurological:     Mental Status: She is alert.      Diagnostic studies: labs sent instead     Salvatore Marvel, MD [facility] [place] [facility] [place]

## 2020-11-30 LAB — IGE+ALLERGENS ZONE 2(30)

## 2020-11-30 LAB — ALPHA-GAL PANEL
Allergen Lamb IgE: <0.1 kU/L
Beef IgE: <0.1 kU/L
IgE (Immunoglobulin E), Serum: 19 IU/mL (ref 6–495)
O215-IgE Alpha-Gal: <0.1 kU/L
Pork IgE: <0.1 kU/L

## 2020-11-30 LAB — ALLERGEN PROFILE, MOLD
Aureobasidi Pullulans IgE: <0.1 kU/L
Candida Albicans IgE: <0.1 kU/L
M009-IgE Fusarium proliferatum: <0.1 kU/L
M014-IgE Epicoccum purpur: <0.1 kU/L
Phoma Betae IgE: <0.1 kU/L
Setomelanomma Rostrat: <0.1 kU/L

## 2020-12-04 LAB — CMP14+EGFR
ALT: 21 IU/L (ref 0–32)
AST: 32 IU/L (ref 0–40)
Albumin/Globulin Ratio: 1.9 (ref 1.2–2.2)
Albumin: 4.6 g/dL (ref 3.6–4.6)
Alkaline Phosphatase: 99 IU/L (ref 44–121)
BUN/Creatinine Ratio: 21 (ref 12–28)
BUN: 31 mg/dL — ABNORMAL HIGH (ref 8–27)
Bilirubin Total: 0.2 mg/dL (ref 0.0–1.2)
CO2: 25 mmol/L (ref 20–29)
Calcium: 9.9 mg/dL (ref 8.7–10.3)
Chloride: 101 mmol/L (ref 96–106)
Creatinine, Ser: 1.49 mg/dL — ABNORMAL HIGH (ref 0.57–1.00)
Globulin, Total: 2.4 g/dL (ref 1.5–4.5)
Glucose: 73 mg/dL (ref 65–99)
Potassium: 4.1 mmol/L (ref 3.5–5.2)
Sodium: 140 mmol/L (ref 134–144)
Total Protein: 7 g/dL (ref 6.0–8.5)
eGFR: 34 mL/min/{1.73_m2} — ABNORMAL LOW (ref >59)

## 2020-12-04 LAB — C-REACTIVE PROTEIN: CRP: 2 mg/L (ref 0–10)

## 2020-12-04 LAB — C1 ESTERASE INHIBITOR: C1INH SerPl-mCnc: 42 mg/dL — ABNORMAL HIGH (ref 21–39)

## 2020-12-04 LAB — C1 ESTERASE INHIBITOR, FUNCTIONAL: C1INH Functional/C1INH Total MFr SerPl: 100 %mean normal

## 2020-12-04 LAB — C3 AND C4
Complement C3, Serum: 116 mg/dL (ref 82–167)
Complement C4, Serum: 27 mg/dL (ref 12–38)

## 2020-12-04 LAB — TRYPTASE: Tryptase: 13.3 ug/L — ABNORMAL HIGH (ref 2.2–13.2)

## 2020-12-04 LAB — COMPLEMENT COMPONENT C1Q: Complement C1Q: 17.3 mg/dL (ref 10.3–20.5)

## 2020-12-04 LAB — ANTINUCLEAR ANTIBODIES, IFA: ANA Titer 1: NEGATIVE

## 2020-12-04 LAB — SEDIMENTATION RATE: Sed Rate: 8 mm/hr (ref 0–40)

## 2020-12-06 ENCOUNTER — Other Ambulatory Visit: Payer: Self-pay

## 2020-12-06 MED ORDER — ONETOUCH DELICA LANCETS 33G MISC: 1.0000 | Freq: Three times a day (TID) | 2 refills | Status: DC

## 2020-12-07 DIAGNOSIS — E1122 Type 2 diabetes mellitus with diabetic chronic kidney disease: Secondary | ICD-10-CM | POA: Diagnosis not present

## 2020-12-07 DIAGNOSIS — N1831 Chronic kidney disease, stage 3a: Secondary | ICD-10-CM | POA: Diagnosis not present

## 2020-12-07 DIAGNOSIS — Z794 Long term (current) use of insulin: Secondary | ICD-10-CM | POA: Diagnosis not present

## 2020-12-08 LAB — BASIC METABOLIC PANEL
BUN/Creatinine Ratio: 17 (ref 12–28)
BUN: 22 mg/dL (ref 8–27)
CO2: 24 mmol/L (ref 20–29)
Calcium: 9.4 mg/dL (ref 8.7–10.3)
Chloride: 97 mmol/L (ref 96–106)
Creatinine, Ser: 1.3 mg/dL — ABNORMAL HIGH (ref 0.57–1.00)
Glucose: 142 mg/dL — ABNORMAL HIGH (ref 65–99)
Potassium: 4.9 mmol/L (ref 3.5–5.2)
Sodium: 135 mmol/L (ref 134–144)
eGFR: 41 mL/min/{1.73_m2} — ABNORMAL LOW (ref >59)

## 2020-12-09 ENCOUNTER — Encounter: Payer: Self-pay | Admitting: Internal Medicine

## 2020-12-09 ENCOUNTER — Other Ambulatory Visit: Payer: Self-pay

## 2020-12-09 ENCOUNTER — Ambulatory Visit (INDEPENDENT_AMBULATORY_CARE_PROVIDER_SITE_OTHER): Payer: Medicare Other | Admitting: Internal Medicine

## 2020-12-09 VITALS — BP 140/72 | HR 69 | Resp 18 | Ht 61.0 in | Wt 130.1 lb

## 2020-12-09 DIAGNOSIS — I10 Essential (primary) hypertension: Secondary | ICD-10-CM

## 2020-12-09 DIAGNOSIS — N1832 Chronic kidney disease, stage 3b: Secondary | ICD-10-CM | POA: Diagnosis not present

## 2020-12-09 NOTE — Progress Notes (Signed)
Established Patient Office Visit  Subjective:  Patient ID: Emily Jennings, female    DOB: Aug 10, 1934  Age: 87 y.o. MRN: [id number]  CC:  Chief Complaint  Patient presents with  . Follow-up    4 week follow up     HPI Emily Jennings presents for follow up of HTN.  BP was 140/72 today, which would be considered controlled for her considering her age. Her BP remains around 110s/70s at home. She takes medications regularly. Patient denies headache, dizziness, chest pain, dyspnea or palpitations.  She had BMP done recently and asked about her kidney function. She denies any dysuria or hematuria.  Past Medical History:  Diagnosis Date  . Allergy   . Anxiety   . ANXIETY DISORDER, GENERALIZED 07/15/2007   Qualifier: Diagnosis of  By: Rennie Plowman    . Arthritis   . Cancer [facility]) 2009   breast, carcinoma in situ left  . Carcinoma in situ of breast 05/21/2008   Qualifier: Diagnosis of  By: Moshe Cipro MD, Margaret  Diagnosed in 2009, completed 5 year course of tamoxifen, no evidence of recurrence   . Carotid stenosis    11/16/2005  mild plaque formation and stenosis proximal right ECA  . Cataract   . Complication of anesthesia   . Coronary artery disease    cardiac catheterization on 03/20/2006  LAD mid 40% stenosis, left circumflex mild 40% stenosis, RCA mid-vessel 40% to 50% lesion   EF 60%  . Diabetes mellitus   . GERD (gastroesophageal reflux disease)   . Hernia, inguinal    left  . Hyperglycemia   . Hypertension   . Insomnia 11/16/2011  . Low blood potassium   . Non-insulin dependent type 2 diabetes mellitus ([place])   . Osteoporosis   . Shortness of breath    2D Echocardiogram 01/26/2009   EF of greater than 55%, mild MR, mild TR, normal ventricular function  . Thickened endometrium 10/26/2017   Noted by gyne in 2017, missed 6 month follow up, referred in 09/2017  . Ventricular tachycardia, non-sustained (HCC)    developed during stress test 02/08/2006, spontaneously aborted, mild  reversible apical defect    Past Surgical History:  Procedure Laterality Date  . BREAST LUMPECTOMY Left 2009   Left breast 2009  . CATARACT EXTRACTION W/PHACO Left 10/28/2014   Procedure: PHACO EMULSION CATARACT EXTRACTION WITH INTRAOCULAR LENS IMPLANT LEFT EYE (IOC);  Surgeon: Marylynn Pearson, MD;  Location: [facility];  Service: Ophthalmology;  Laterality: Left;  . COLONOSCOPY    . cyst removed from left foot      Family History  Problem Relation Age of Onset  . Hypertension Mother   . Hyperlipidemia Mother   . Stroke Mother   . Urticaria Mother   . Cancer Father        pancreatic  . Colon cancer Father   . Heart disease Brother 40       bypass  . Heart disease Brother 52       bypass  . Arthritis Other   . Asthma Other   . Diabetes Other   . Colon cancer Paternal Aunt   . Esophageal cancer Neg Hx   . Stomach cancer Neg Hx   . Rectal cancer Neg Hx     Social History   Socioeconomic History  . Marital status: Married    Spouse name: Not on file  . Number of children: 2  . Years of education: na  . Highest education level: Some college, no  degree  Occupational History  . Occupation: retired    Fish farm manager: retired  Tobacco Use  . Smoking status: Never Smoker  . Smokeless tobacco: Never Used  Vaping Use  . Vaping Use: Never used  Substance and Sexual Activity  . Alcohol use: No  . Drug use: No  . Sexual activity: Not Currently    Birth control/protection: Post-menopausal  Other Topics Concern  . Not on file  Social History Narrative   Lives with her husband and son and attends yoga at the [facility] 2 days a week and speaks to her grandson nightly on the phone   Social Determinants of Health   Financial Resource Strain: Low Risk   . Difficulty of Paying Living Expenses: Not hard at all  Food Insecurity: No Food Insecurity  . Worried About Charity fundraiser in the Last Year: Never true  . Ran Out of Food in the Last Year: Never true  Transportation Needs: No  Transportation Needs  . Lack of Transportation (Medical): No  . Lack of Transportation (Non-Medical): No  Physical Activity: Insufficiently Active  . Days of Exercise per Week: 7 days  . Minutes of Exercise per Session: 20 min  Stress: No Stress Concern Present  . Feeling of Stress : Not at all  Social Connections: Moderately Isolated  . Frequency of Communication with Friends and Family: More than three times a week  . Frequency of Social Gatherings with Friends and Family: Twice a week  . Attends Religious Services: Never  . Active Member of Clubs or Organizations: No  . Attends Archivist Meetings: Never  . Marital Status: Married  Human resources officer Violence: Not At Risk  . Fear of Current or Ex-Partner: No  . Emotionally Abused: No  . Physically Abused: No  . Sexually Abused: No    Outpatient Medications Prior to Visit  Medication Sig Dispense Refill  . acetaminophen (TYLENOL) 500 MG tablet Take one tablet two times daily for 3 days, then as needed 30 tablet 0  . alendronate (FOSAMAX) 70 MG tablet TAKE 1 TABLET BY MOUTH  EVERY 7 DAYS TAKE WITH A  FULL GLASS OF WATER ON AN  EMPTY STOMACH 12 tablet 3  . amLODipine (NORVASC) 10 MG tablet TAKE 1 TABLET BY MOUTH EVERY DAY 90 tablet 1  . aspirin EC 81 MG tablet Take 81 mg by mouth daily.    . blood glucose meter kit and supplies KIT One touch Ultra. Use three times daily as directed. (FOR ICD E11.65) 1 each 0  . cholecalciferol (VITAMIN D3) 25 MCG (1000 UT) tablet Take 1,000 Units by mouth daily.    . famotidine (PEPCID) 20 MG tablet Take 1 tablet (20 mg total) by mouth 2 (two) times daily. 60 tablet 5  . glipiZIDE (GLUCOTROL XL) 10 MG 24 hr tablet Take 10 mg by mouth daily with breakfast.    . glucose blood (ONETOUCH ULTRA) test strip Use as instructed to test blood glucose three times daily. DX E11.65 300 each 12  . insulin lispro protamine-lispro (HUMALOG MIX 75/25) (75-25) 100 UNIT/ML SUSP injection INJECT SUBCUTANEOUSLY  10  UNITS TWICE DAILY (Patient taking differently: INJECT SUBCUTANEOUSLY 15  UNITS in AM 10 unit at Dinner) 30 mL 3  . Insulin Pen Needle 32G X 4 MM MISC 1 each by Does not apply route as directed. 100 each 3  . Insulin Syringe-Needle U-100 (BD INSULIN SYRINGE U/F) 31G X 5/16" 0.5 ML MISC USE AS DIRECTED TWICE DAILY 100 each 3  .  losartan (COZAAR) 100 MG tablet Take 1 tablet (100 mg total) by mouth daily. 30 tablet 5  . metoprolol tartrate (LOPRESSOR) 50 MG tablet Take 1 tablet (50 mg total) by mouth 2 (two) times daily. 180 tablet 1  . mupirocin ointment (BACTROBAN) 2 % Apply topically 2 (two) times daily.    Glory Rosebush Delica Lancets 93T MISC 1 each by Does not apply route 3 (three) times daily. Use to check glucose three times daily 100 each 2  . potassium chloride SA (KLOR-CON M20) 20 MEQ tablet Take 1 tablet (20 mEq total) by mouth 2 (two) times daily. 180 tablet 1  . RESTASIS 0.05 % ophthalmic emulsion     . rosuvastatin (CRESTOR) 5 MG tablet TAKE 1 TABLET BY MOUTH EVERYDAY AT BEDTIME 90 tablet 3  . spironolactone (ALDACTONE) 25 MG tablet TAKE 1 TABLET BY MOUTH EVERY DAY 90 tablet 1   No facility-administered medications prior to visit.    Allergies  Allergen Reactions  . Benadryl [Diphenhydramine Hcl] Hypertension  . Citalopram   . Metformin And Related Diarrhea  . Tramadol Other (See Comments)    Felt light headed and dizzy  . Tylenol [Acetaminophen] Other (See Comments)    Patient stated tylenol makes her "feel high"    ROS Review of Systems  Constitutional: Negative for chills and fever.  HENT: Negative for congestion, sinus pressure, sinus pain and sore throat.   Eyes: Negative for pain and discharge.  Respiratory: Negative for cough and shortness of breath.   Cardiovascular: Negative for chest pain and palpitations.  Gastrointestinal: Negative for abdominal pain, constipation, diarrhea, nausea and vomiting.  Endocrine: Negative for polydipsia and polyuria.   Genitourinary: Negative for dysuria and hematuria.  Musculoskeletal: Negative for neck pain and neck stiffness.  Skin: Negative for rash.  Neurological: Negative for dizziness and weakness.  Psychiatric/Behavioral: Negative for agitation and behavioral problems.      Objective:    Physical Exam Vitals reviewed.  Constitutional:      General: She is not in acute distress.    Appearance: She is not diaphoretic.  HENT:     Head: Normocephalic and atraumatic.     Nose: Nose normal. No congestion.     Mouth/Throat:     Mouth: Mucous membranes are moist.     Pharynx: No posterior oropharyngeal erythema.  Eyes:     General: No scleral icterus.    Extraocular Movements: Extraocular movements intact.     Pupils: Pupils are equal, round, and reactive to light.  Cardiovascular:     Rate and Rhythm: Normal rate and regular rhythm.     Pulses: Normal pulses.     Heart sounds: Normal heart sounds. No murmur heard.   Pulmonary:     Breath sounds: Normal breath sounds. No wheezing or rales.  Musculoskeletal:     Cervical back: Neck supple. No tenderness.     Right lower leg: No edema.     Left lower leg: No edema.  Skin:    General: Skin is warm.     Findings: No rash.  Neurological:     General: No focal deficit present.     Mental Status: She is alert and oriented to person, place, and time.  Psychiatric:        Mood and Affect: Mood normal.        Behavior: Behavior normal.     BP 140/72 (BP Location: Right Arm, Patient Position: Sitting, Cuff Size: Normal)   Pulse 69   Resp 18  Ht 5' 1"  (1.549 m)   Wt 130 lb 1.9 oz (59 kg)   SpO2 98%   BMI 24.59 kg/m  Wt Readings from Last 3 Encounters:  12/09/20 130 lb 1.9 oz (59 kg)  11/26/20 133 lb 9.6 oz (60.6 kg)  11/17/20 134 lb 3.2 oz (60.9 kg)     Health Maintenance Due  Topic Date Due  . OPHTHALMOLOGY EXAM  04/12/2018    There are no preventive care reminders to display for this patient.  Lab Results  Component  Value Date   TSH 1.29 06/29/2020   Lab Results  Component Value Date   WBC 5.9 03/24/2020   HGB 12.8 03/24/2020   HCT 39.3 03/24/2020   MCV 92.9 03/24/2020   PLT 239 03/24/2020   Lab Results  Component Value Date   NA 135 12/07/2020   K 4.9 12/07/2020   CO2 24 12/07/2020   GLUCOSE 142 (H) 12/07/2020   BUN 22 12/07/2020   CREATININE 1.30 (H) 12/07/2020   BILITOT 0.2 11/26/2020   ALKPHOS 99 11/26/2020   AST 32 11/26/2020   ALT 21 11/26/2020   PROT 7.0 11/26/2020   ALBUMIN 4.6 11/26/2020   CALCIUM 9.4 12/07/2020   ANIONGAP 8 08/29/2017   Lab Results  Component Value Date   CHOL 207 (H) 06/29/2020   Lab Results  Component Value Date   HDL 115 06/29/2020   Lab Results  Component Value Date   LDLCALC 80 06/29/2020   Lab Results  Component Value Date   TRIG 50 06/29/2020   Lab Results  Component Value Date   CHOLHDL 1.8 06/29/2020   Lab Results  Component Value Date   HGBA1C 7.1 (A) 11/17/2020      Assessment & Plan:   Problem List Items Addressed This Visit      Cardiovascular and Mediastinum   Essential hypertension - Primary    BP Readings from Last 1 Encounters:  12/09/20 140/72   Overall well-controlled with Amlodipine, Losartan, Metoprolol and Spironolactone Mild elevation can be due to white coat hypertension Would keep the same regimen for now Counseled for compliance with the medications Advised DASH diet and moderate exercise/walking, at least 150 mins/week        Genitourinary   Stage 3b chronic kidney disease ([place])    Last BMP reviewed With proteinuria, on Losartan Will check BMP, if persistent or worsening kidney function, will refer  to Nephrology Avoid nephrotoxic agents      Relevant Orders   Basic Metabolic Panel (BMET)      No orders of the defined types were placed in this encounter.   Follow-up: Return in about 4 months (around 04/10/2021), or if symptoms worsen or fail to improve.    Lindell Spar, MD

## 2020-12-09 NOTE — Assessment & Plan Note (Addendum)
BP Readings from Last 1 Encounters:  12/09/20 140/72   Overall well-controlled with Amlodipine, Losartan, Metoprolol and Spironolactone Mild elevation can be due to white coat hypertension Would keep the same regimen for now Counseled for compliance with the medications Advised DASH diet and moderate exercise/walking, at least 150 mins/week

## 2020-12-09 NOTE — Patient Instructions (Signed)
Continue to take same medications for now.  If you notice blood pressure more than 150/90 on 3 consecutive measurements, please contact us.

## 2020-12-09 NOTE — Assessment & Plan Note (Signed)
Last BMP reviewed With proteinuria, on Losartan Will check BMP, if persistent or worsening kidney function, will refer  to Nephrology Avoid nephrotoxic agents

## 2020-12-14 ENCOUNTER — Ambulatory Visit (HOSPITAL_COMMUNITY)
Admission: RE | Admit: 2020-12-14 | Discharge: 2020-12-14 | Disposition: A | Discharge status: Home / Self Care | Payer: Medicare Other | Source: Ambulatory Visit | Attending: Family Medicine | Admitting: Family Medicine

## 2020-12-14 ENCOUNTER — Other Ambulatory Visit: Payer: Self-pay

## 2020-12-14 DIAGNOSIS — R928 Other abnormal and inconclusive findings on diagnostic imaging of breast: Secondary | ICD-10-CM | POA: Insufficient documentation

## 2020-12-14 DIAGNOSIS — R922 Inconclusive mammogram: Secondary | ICD-10-CM | POA: Diagnosis not present

## 2020-12-14 DIAGNOSIS — Z853 Personal history of malignant neoplasm of breast: Secondary | ICD-10-CM | POA: Diagnosis not present

## 2020-12-16 ENCOUNTER — Other Ambulatory Visit: Payer: Self-pay | Admitting: Family Medicine

## 2020-12-27 ENCOUNTER — Other Ambulatory Visit: Payer: Self-pay | Admitting: Family Medicine

## 2020-12-27 ENCOUNTER — Emergency Department (HOSPITAL_COMMUNITY): Payer: Medicare Other

## 2020-12-27 ENCOUNTER — Encounter (HOSPITAL_COMMUNITY): Payer: Self-pay | Admitting: *Deleted

## 2020-12-27 ENCOUNTER — Emergency Department (HOSPITAL_COMMUNITY)
Admission: EM | Admit: 2020-12-27 | Discharge: 2020-12-28 | Disposition: A | Discharge status: Home / Self Care | Payer: Medicare Other | Attending: Emergency Medicine | Admitting: Emergency Medicine

## 2020-12-27 ENCOUNTER — Other Ambulatory Visit: Payer: Self-pay

## 2020-12-27 DIAGNOSIS — I2510 Atherosclerotic heart disease of native coronary artery without angina pectoris: Secondary | ICD-10-CM | POA: Diagnosis not present

## 2020-12-27 DIAGNOSIS — S52612A Displaced fracture of left ulna styloid process, initial encounter for closed fracture: Secondary | ICD-10-CM | POA: Insufficient documentation

## 2020-12-27 DIAGNOSIS — I129 Hypertensive chronic kidney disease with stage 1 through stage 4 chronic kidney disease, or unspecified chronic kidney disease: Secondary | ICD-10-CM | POA: Insufficient documentation

## 2020-12-27 DIAGNOSIS — S0181XA Laceration without foreign body of other part of head, initial encounter: Secondary | ICD-10-CM | POA: Diagnosis not present

## 2020-12-27 DIAGNOSIS — S0990XA Unspecified injury of head, initial encounter: Secondary | ICD-10-CM | POA: Diagnosis not present

## 2020-12-27 DIAGNOSIS — Z853 Personal history of malignant neoplasm of breast: Secondary | ICD-10-CM | POA: Insufficient documentation

## 2020-12-27 DIAGNOSIS — Y9301 Activity, walking, marching and hiking: Secondary | ICD-10-CM | POA: Diagnosis not present

## 2020-12-27 DIAGNOSIS — W1839XA Other fall on same level, initial encounter: Secondary | ICD-10-CM | POA: Diagnosis not present

## 2020-12-27 DIAGNOSIS — Z23 Encounter for immunization: Secondary | ICD-10-CM | POA: Insufficient documentation

## 2020-12-27 DIAGNOSIS — Z951 Presence of aortocoronary bypass graft: Secondary | ICD-10-CM | POA: Insufficient documentation

## 2020-12-27 DIAGNOSIS — G319 Degenerative disease of nervous system, unspecified: Secondary | ICD-10-CM | POA: Diagnosis not present

## 2020-12-27 DIAGNOSIS — R9431 Abnormal electrocardiogram [ECG] [EKG]: Secondary | ICD-10-CM | POA: Diagnosis not present

## 2020-12-27 DIAGNOSIS — S6992XA Unspecified injury of left wrist, hand and finger(s), initial encounter: Secondary | ICD-10-CM | POA: Diagnosis present

## 2020-12-27 DIAGNOSIS — E1122 Type 2 diabetes mellitus with diabetic chronic kidney disease: Secondary | ICD-10-CM | POA: Diagnosis not present

## 2020-12-27 DIAGNOSIS — N1832 Chronic kidney disease, stage 3b: Secondary | ICD-10-CM | POA: Diagnosis not present

## 2020-12-27 DIAGNOSIS — S52615A Nondisplaced fracture of left ulna styloid process, initial encounter for closed fracture: Secondary | ICD-10-CM | POA: Diagnosis not present

## 2020-12-27 DIAGNOSIS — M7989 Other specified soft tissue disorders: Secondary | ICD-10-CM | POA: Diagnosis not present

## 2020-12-27 DIAGNOSIS — Z794 Long term (current) use of insulin: Secondary | ICD-10-CM | POA: Insufficient documentation

## 2020-12-27 DIAGNOSIS — M25532 Pain in left wrist: Secondary | ICD-10-CM | POA: Diagnosis not present

## 2020-12-27 DIAGNOSIS — S52502A Unspecified fracture of the lower end of left radius, initial encounter for closed fracture: Secondary | ICD-10-CM | POA: Diagnosis not present

## 2020-12-27 DIAGNOSIS — S62102A Fracture of unspecified carpal bone, left wrist, initial encounter for closed fracture: Secondary | ICD-10-CM

## 2020-12-27 DIAGNOSIS — I6381 Other cerebral infarction due to occlusion or stenosis of small artery: Secondary | ICD-10-CM | POA: Diagnosis not present

## 2020-12-27 DIAGNOSIS — I517 Cardiomegaly: Secondary | ICD-10-CM | POA: Diagnosis not present

## 2020-12-27 LAB — CBG MONITORING, ED: Glucose-Capillary: 116 mg/dL — ABNORMAL HIGH (ref 70–99)

## 2020-12-27 MED ORDER — LIDOCAINE-EPINEPHRINE 1 %-1:100000 IJ SOLN
20.0000 mL | Freq: Once | INTRAMUSCULAR | Status: DC
  Filled 2020-12-27: qty 20

## 2020-12-27 MED ORDER — TETANUS-DIPHTH-ACELL PERTUSSIS 5-2.5-18.5 LF-MCG/0.5 IM SUSY
0.5000 mL | PREFILLED_SYRINGE | Freq: Once | INTRAMUSCULAR | Status: AC
  Administered 2020-12-28: 0.5 mL via INTRAMUSCULAR
  Filled 2020-12-27: qty 0.5

## 2020-12-27 NOTE — ED Provider Notes (Incomplete)
Emergency Department Provider Note  I have reviewed the triage vital signs and the nursing notes.  HISTORY  Chief Complaint Head Injury (States her blood sugar dropped and she had a syncopal episode, fell against a cabinet causing laceration to forehead. Swelling and deformity left wrist)   HPI Emily Jennings is a 87 y.o. female who presents the emerge department after a fall.  Patient states that she was getting ready to eat and her blood sugar got low (she later stated that it was measured at 49) when she was walking along and fell forward.  She hit her head sustaining a laceration to her forehead and then subsequently landed on outstretched hands and obtained an injury to her left wrist which is deformed.  She states she did not hurt a thing else.  She may have gotten fuzzyheaded but is not sure if she actually passed out.  She does not think that she did.  No chest pain or back pain surrounding the incident.  No other associated symptoms at this time.   No other associated or modifying symptoms.    Past Medical History:  Diagnosis Date  . Allergy   . Anxiety   . ANXIETY DISORDER, GENERALIZED 07/15/2007   Qualifier: Diagnosis of  By: Rennie Plowman    . Arthritis   . Cancer [facility]) 2009   breast, carcinoma in situ left  . Carcinoma in situ of breast 05/21/2008   Qualifier: Diagnosis of  By: Moshe Cipro MD, Margaret  Diagnosed in 2009, completed 5 year course of tamoxifen, no evidence of recurrence   . Carotid stenosis    11/16/2005  mild plaque formation and stenosis proximal right ECA  . Cataract   . Complication of anesthesia   . Coronary artery disease    cardiac catheterization on 03/20/2006  LAD mid 40% stenosis, left circumflex mild 40% stenosis, RCA mid-vessel 40% to 50% lesion   EF 60%  . Diabetes mellitus   . GERD (gastroesophageal reflux disease)   . Hernia, inguinal    left  . Hyperglycemia   . Hypertension   . Insomnia 11/16/2011  . Low blood potassium   .  Non-insulin dependent type 2 diabetes mellitus ([place])   . Osteoporosis   . Shortness of breath    2D Echocardiogram 01/26/2009   EF of greater than 55%, mild MR, mild TR, normal ventricular function  . Thickened endometrium 10/26/2017   Noted by gyne in 2017, missed 6 month follow up, referred in 09/2017  . Ventricular tachycardia, non-sustained ([facility])    developed during stress test 02/08/2006, spontaneously aborted, mild reversible apical defect    Patient Active Problem List   Diagnosis Date Noted  . Lip swelling 10/30/2020  . Right leg pain 10/30/2020  . Swelling 08/27/2020  . Tubular adenoma of colon 10/26/2017  . Osteoporosis 07/28/2014  . Diabetic hypertension-nephrosis syndrome ([place]) 03/23/2014  . Stage 3b chronic kidney disease ([place]) 03/23/2014  . Type 2 diabetes mellitus with stage 3a chronic kidney disease, with long-term current use of insulin ([place]) 09/29/2013  . Allergic rhinitis 01/27/2013  . GERD (gastroesophageal reflux disease) 01/12/2013  . CVD (cardiovascular disease) 07/17/2011  . Mixed hyperlipidemia 07/15/2007  . Essential hypertension 07/15/2007    Past Surgical History:  Procedure Laterality Date  . BREAST LUMPECTOMY Left 2009   Left breast 2009  . CATARACT EXTRACTION W/PHACO Left 10/28/2014   Procedure: PHACO EMULSION CATARACT EXTRACTION WITH INTRAOCULAR LENS IMPLANT LEFT EYE (IOC);  Surgeon: Marylynn Pearson, MD;  Location: [facility]  OR;  Service: Ophthalmology;  Laterality: Left;  . COLONOSCOPY    . cyst removed from left foot      Current Outpatient Rx  . Order #: [id number] Class: Normal  . Order #: [id number] Class: Normal  . Order #: [id number] Class: Normal  . Order #: [id number] Class: Historical Med  . Order #: [id number] Class: Print  . Order #: [id number] Class: Historical Med  . Order #: [id number] Class: Normal  . Order #: [id number] Class: Historical Med  . Order #: [id number] Class: Normal  . Order #: [id number] Class: Normal  . Order #: [id number] Class: Normal  .  Order #: [id number] Class: Normal  . Order #: [id number] Class: Normal  . Order #: [id number] Class: Normal  . Order #: [id number] Class: Normal  . Order #: [id number] Class: Historical Med  . Order #: [id number] Class: Normal  . Order #: [id number] Class: Historical Med  . Order #: [id number] Class: Normal  . Order #: [id number] Class: Normal    Allergies Benadryl [diphenhydramine hcl], Citalopram, Metformin and related, Tramadol, and Tylenol [acetaminophen]  Family History  Problem Relation Age of Onset  . Hypertension Mother   . Hyperlipidemia Mother   . Stroke Mother   . Urticaria Mother   . Cancer Father        pancreatic  . Colon cancer Father   . Heart disease Brother 40       bypass  . Heart disease Brother 64       bypass  . Arthritis Other   . Asthma Other   . Diabetes Other   . Colon cancer Paternal Aunt   . Esophageal cancer Neg Hx   . Stomach cancer Neg Hx   . Rectal cancer Neg Hx     Social History Social History   Tobacco Use  . Smoking status: Never Smoker  . Smokeless tobacco: Never Used  Vaping Use  . Vaping Use: Never used  Substance Use Topics  . Alcohol use: No  . Drug use: No    Review of Systems  All other systems negative except as documented in the HPI. All pertinent positives and negatives as reviewed in the HPI. ____________________________________________  PHYSICAL EXAM:  VITAL SIGNS: ED Triage Vitals  Enc Vitals Group     BP 12/27/20 1858 (!) 159/71     Pulse Rate 12/27/20 1858 79     Resp 12/27/20 1858 20     Temp 12/27/20 1858 98.3 F (36.8 C)     Temp Source 12/27/20 1858 Oral     SpO2 12/27/20 1858 100 %    Constitutional: Alert and oriented. Well appearing and in no acute distress. Eyes: Conjunctivae are normal. PERRL. EOMI. Head: laceration to forehead, hemostatic, approximately 2.5 cm. Nose: No congestion/rhinnorhea. Mouth/Throat: Mucous membranes are moist.  Oropharynx non-erythematous. Neck: No stridor.  No meningeal signs.    Cardiovascular: Normal rate, regular rhythm. Good peripheral circulation. Grossly normal heart sounds.   Respiratory: Normal respiratory effort.  No retractions. Lungs CTAB. Gastrointestinal: Soft and nontender. No distention.  Musculoskeletal: No lower extremity tenderness nor edema. No gross deformities of extremities. Neurologic:  Normal speech and language. No gross focal neurologic deficits are appreciated.  Skin:  Skin is warm, dry and intact. No rash noted.  ____________________________________________   LABS (all labs ordered are listed, but only abnormal results are displayed)  Labs Reviewed  CBG MONITORING, ED - Abnormal; Notable for the following components:      Result Value   Glucose-Capillary 116 (*)    All other components within normal limits  CBC WITH DIFFERENTIAL/PLATELET  COMPREHENSIVE METABOLIC  PANEL   ____________________________________________  EKG  *** ____________________________________________  RADIOLOGY  DG Wrist Complete Left  Result Date: 12/27/2020 CLINICAL DATA:  Left wrist pain and deformity after fall today. EXAM: LEFT WRIST - COMPLETE 3+ VIEW COMPARISON:  None. FINDINGS: Comminuted and displaced distal radial metaphyseal fracture. There is dorsal displacement with apex volar angulation of distal fracture fragment. Disruption of the distal radioulnar joint. Displaced ulna styloid fracture. The carpal bones remain aligned with the displaced distal radial fracture fragment. Generalized soft tissue edema about the wrist. IMPRESSION: Comminuted and displaced distal radial metaphyseal fracture with disruption of the distal radioulnar joint. Displaced ulna styloid fracture. Electronically Signed   By: Keith Rake M.D.   On: 12/27/2020 23:38   DG Chest Portable 1 View  Result Date: 12/27/2020 CLINICAL DATA:  Fall today. EXAM: PORTABLE CHEST 1 VIEW COMPARISON:  09/15/2015 FINDINGS: Mild cardiomegaly. Aortic atherosclerosis and tortuosity. There is  chronic hyperinflation and bronchitic change. No pneumothorax or pleural effusion. No acute airspace disease. The bones are under mineralized. No acute osseous abnormalities are seen. IMPRESSION: 1. No acute abnormality. 2. Mild cardiomegaly.  Chronic hyperinflation and bronchitic change. Aortic Atherosclerosis (ICD10-I70.0). Electronically Signed   By: Keith Rake M.D.   On: 12/27/2020 23:40   ____________________________________________  PROCEDURES  Procedure(s) performed:   Procedures ____________________________________________  INITIAL IMPRESSION / ASSESSMENT AND PLAN    This patient presents to the ED for concern of ***, this involves an extensive number of treatment options, and is a complaint that carries with it a high risk of complications and morbidity.  The differential diagnosis includes ***  Additional history obtained:   Additional history obtained from ***  Previous records obtained and reviewed ***  ED Course  Clinical Course as of 12/27/20 2344  Mon Dec 27, 2020  2342 DG Wrist Complete Left Fracture/dislocation. Will need reduction [JM]  2342 DG Chest Portable 1 View Reassurring, no obvious rib fractures, edema, consolidation or significant cardiomegaly.  [JM]    Clinical Course User Index [JM] Mesner, Corene Cornea, MD     Medicines ordered:  Medications  lidocaine-EPINEPHrine (XYLOCAINE W/EPI) 1 %-1:100000 (with pres) injection 20 mL (has no administration in time range)  Tdap (BOOSTRIX) injection 0.5 mL (has no administration in time range)     Consultations Obtained:   I consulted ***  and discussed lab and imaging findings   CRITICAL INTERVENTIONS:  . ***  Reevaluation:  After the interventions stated above, I reevaluated the patient and found ***  FINAL IMPRESSION AND PLAN Final diagnoses:  None    . ***  ***Medical screening exam was performed and I feel the patient has had appropriate emergency department evaluation and work-up  for their chief complaint and is stable for ADMISSION to the hospitalist time.  I discussed with *** with the ***service and discussed labs, imaging and other work-up in the emergency room.  They agree to admission for further management and work-up of said condition. *** A medical screening exam was performed and I feel the patient has had an appropriate workup for their chief complaint at this time and likelihood of emergent condition existing is low. They have been counseled on decision, DISCHARGE, follow up and which symptoms necessitate immediate return to the emergency department. They or their family verbally stated understanding and agreement with plan and discharged in stable condition.   ____________________________________________   NEW OUTPATIENT MEDICATIONS STARTED DURING THIS VISIT:  New Prescriptions   No medications on file    Note:  This note was  prepared with assistance of Systems analyst. Occasional wrong-word or sound-a-like substitutions may have occurred due to the inherent limitations of voice recognition software.

## 2020-12-27 NOTE — ED Provider Notes (Signed)
Emergency Department Provider Note  I have reviewed the triage vital signs and the nursing notes.  HISTORY  Chief Complaint Head Injury (States her blood sugar dropped and she had a syncopal episode, fell against a cabinet causing laceration to forehead. Swelling and deformity left wrist)   HPI Emily Jennings is a 87 y.o. female who presents the emerge department after a fall.  Patient states that she was getting ready to eat and her blood sugar got low (she later stated that it was measured at 49) when she was walking along and fell forward.  She hit her head sustaining a laceration to her forehead and then subsequently landed on outstretched hands and obtained an injury to her left wrist which is deformed.  She states she did not hurt a thing else.  She may have gotten fuzzyheaded but is not sure if she actually passed out.  She does not think that she did.  No chest pain or back pain surrounding the incident.  No other associated symptoms at this time.   No other associated or modifying symptoms.    Past Medical History:  Diagnosis Date  . Allergy   . Anxiety   . ANXIETY DISORDER, GENERALIZED 07/15/2007   Qualifier: Diagnosis of  By: Rennie Plowman    . Arthritis   . Cancer [facility]) 2009   breast, carcinoma in situ left  . Carcinoma in situ of breast 05/21/2008   Qualifier: Diagnosis of  By: Moshe Cipro MD, Margaret  Diagnosed in 2009, completed 5 year course of tamoxifen, no evidence of recurrence   . Carotid stenosis    11/16/2005  mild plaque formation and stenosis proximal right ECA  . Cataract   . Complication of anesthesia   . Coronary artery disease    cardiac catheterization on 03/20/2006  LAD mid 40% stenosis, left circumflex mild 40% stenosis, RCA mid-vessel 40% to 50% lesion   EF 60%  . Diabetes mellitus   . GERD (gastroesophageal reflux disease)   . Hernia, inguinal    left  . Hyperglycemia   . Hypertension   . Insomnia 11/16/2011  . Low blood potassium   .  Non-insulin dependent type 2 diabetes mellitus ([place])   . Osteoporosis   . Shortness of breath    2D Echocardiogram 01/26/2009   EF of greater than 55%, mild MR, mild TR, normal ventricular function  . Thickened endometrium 10/26/2017   Noted by gyne in 2017, missed 6 month follow up, referred in 09/2017  . Ventricular tachycardia, non-sustained ([facility])    developed during stress test 02/08/2006, spontaneously aborted, mild reversible apical defect    Patient Active Problem List   Diagnosis Date Noted  . Lip swelling 10/30/2020  . Right leg pain 10/30/2020  . Swelling 08/27/2020  . Tubular adenoma of colon 10/26/2017  . Osteoporosis 07/28/2014  . Diabetic hypertension-nephrosis syndrome ([place]) 03/23/2014  . Stage 3b chronic kidney disease ([place]) 03/23/2014  . Type 2 diabetes mellitus with stage 3a chronic kidney disease, with long-term current use of insulin ([place]) 09/29/2013  . Allergic rhinitis 01/27/2013  . GERD (gastroesophageal reflux disease) 01/12/2013  . CVD (cardiovascular disease) 07/17/2011  . Mixed hyperlipidemia 07/15/2007  . Essential hypertension 07/15/2007    Past Surgical History:  Procedure Laterality Date  . BREAST LUMPECTOMY Left 2009   Left breast 2009  . CATARACT EXTRACTION W/PHACO Left 10/28/2014   Procedure: PHACO EMULSION CATARACT EXTRACTION WITH INTRAOCULAR LENS IMPLANT LEFT EYE (IOC);  Surgeon: Marylynn Pearson, MD;  Location: [facility]  OR;  Service: Ophthalmology;  Laterality: Left;  . COLONOSCOPY    . cyst removed from left foot      Current Outpatient Rx  . Order #: [id number] Class: Print  . Order #: [id number] Class: Normal  . Order #: [id number] Class: Normal  . Order #: [id number] Class: Normal  . Order #: [id number] Class: Historical Med  . Order #: [id number] Class: Print  . Order #: [id number] Class: Historical Med  . Order #: [id number] Class: Normal  . Order #: [id number] Class: Historical Med  . Order #: [id number] Class: Normal  . Order #: [id number] Class: Normal  .  Order #: [id number] Class: Normal  . Order #: [id number] Class: Normal  . Order #: [id number] Class: Normal  . Order #: [id number] Class: Normal  . Order #: [id number] Class: Normal  . Order #: [id number] Class: Historical Med  . Order #: [id number] Class: Normal  . Order #: [id number] Class: Historical Med  . Order #: [id number] Class: Normal  . Order #: [id number] Class: Normal    Allergies Benadryl [diphenhydramine hcl], Citalopram, Metformin and related, Tramadol, and Tylenol [acetaminophen]  Family History  Problem Relation Age of Onset  . Hypertension Mother   . Hyperlipidemia Mother   . Stroke Mother   . Urticaria Mother   . Cancer Father        pancreatic  . Colon cancer Father   . Heart disease Brother 40       bypass  . Heart disease Brother 18       bypass  . Arthritis Other   . Asthma Other   . Diabetes Other   . Colon cancer Paternal Aunt   . Esophageal cancer Neg Hx   . Stomach cancer Neg Hx   . Rectal cancer Neg Hx     Social History Social History   Tobacco Use  . Smoking status: Never Smoker  . Smokeless tobacco: Never Used  Vaping Use  . Vaping Use: Never used  Substance Use Topics  . Alcohol use: No  . Drug use: No    Review of Systems  All other systems negative except as documented in the HPI. All pertinent positives and negatives as reviewed in the HPI. ____________________________________________  PHYSICAL EXAM:  VITAL SIGNS: ED Triage Vitals  Enc Vitals Group     BP 12/27/20 1858 (!) 159/71     Pulse Rate 12/27/20 1858 79     Resp 12/27/20 1858 20     Temp 12/27/20 1858 98.3 F (36.8 C)     Temp Source 12/27/20 1858 Oral     SpO2 12/27/20 1858 100 %    Constitutional: Alert and oriented. Well appearing and in no acute distress. Eyes: Conjunctivae are normal. PERRL. EOMI. Head: laceration to forehead, hemostatic, approximately 2.5 cm. Nose: No congestion/rhinnorhea. Mouth/Throat: Mucous membranes are moist.  Oropharynx  non-erythematous. Neck: No stridor.  No meningeal signs.   Cardiovascular: Normal rate, regular rhythm. Good peripheral circulation. Grossly normal heart sounds.   Respiratory: Normal respiratory effort.  No retractions. Lungs CTAB. Gastrointestinal: Soft and nontender. No distention.  Musculoskeletal: No lower extremity tenderness nor edema. No gross deformities of extremities. Neurologic:  Normal speech and language. No gross focal neurologic deficits are appreciated.  Skin:  Skin is warm, dry and intact. No rash noted.  ____________________________________________   LABS (all labs ordered are listed, but only abnormal results are displayed)  Labs Reviewed  COMPREHENSIVE METABOLIC PANEL - Abnormal; Notable for the following components:      Result Value   Sodium 133 (*)    Glucose, Bld 195 (*)  BUN 28 (*)    Creatinine, Ser 1.34 (*)    GFR, Estimated 39 (*)    All other components within normal limits  CBG MONITORING, ED - Abnormal; Notable for the following components:   Glucose-Capillary 116 (*)    All other components within normal limits  CBC WITH DIFFERENTIAL/PLATELET   ____________________________________________  EKG   EKG Interpretation  Date/Time:  Tuesday December 28 2020 00:01:12 EDT Ventricular Rate:  98 PR Interval:  201 QRS Duration: 83 QT Interval:  363 QTC Calculation: 464 R Axis:   65 Text Interpretation: Sinus rhythm Biatrial enlargement Abnormal R-wave progression, early transition ST depression, consider ischemia, lateral lds Minimal ST elevation, anterior leads Baseline wander in lead(s) V3 V4 Confirmed by Merrily Pew [phone]) on 12/28/2020 12:48:12 AM       ____________________________________________  RADIOLOGY  DG Wrist Complete Left  Result Date: 12/28/2020 CLINICAL DATA:  Post reduction. EXAM: LEFT WRIST - COMPLETE 3+ VIEW COMPARISON:  December 27, 2020 FINDINGS: The left wrist was imaged in a fiberglass cast with subsequently obscured  osseous and soft tissue detail. Acute fracture is seen involving the distal left radius. Approximately 2.5 mm dorsal displacement of the distal fracture site is seen. A displaced fracture of the left ulnar styloid is also noted. There is no evidence of dislocation. Diffuse soft tissue swelling is seen. IMPRESSION: Status post reduction of fractures of the distal left radius and left ulnar styloid. Electronically Signed   By: Virgina Norfolk M.D.   On: 12/28/2020 02:52   DG Wrist Complete Left  Result Date: 12/27/2020 CLINICAL DATA:  Left wrist pain and deformity after fall today. EXAM: LEFT WRIST - COMPLETE 3+ VIEW COMPARISON:  None. FINDINGS: Comminuted and displaced distal radial metaphyseal fracture. There is dorsal displacement with apex volar angulation of distal fracture fragment. Disruption of the distal radioulnar joint. Displaced ulna styloid fracture. The carpal bones remain aligned with the displaced distal radial fracture fragment. Generalized soft tissue edema about the wrist. IMPRESSION: Comminuted and displaced distal radial metaphyseal fracture with disruption of the distal radioulnar joint. Displaced ulna styloid fracture. Electronically Signed   By: Keith Rake M.D.   On: 12/27/2020 23:38   CT Head Wo Contrast  Result Date: 12/28/2020 CLINICAL DATA:  Status post trauma. EXAM: CT HEAD WITHOUT CONTRAST TECHNIQUE: Contiguous axial images were obtained from the base of the skull through the vertex without intravenous contrast. COMPARISON:  None. FINDINGS: Brain: There is mild cerebral atrophy with widening of the extra-axial spaces and ventricular dilatation. There are areas of decreased attenuation within the white matter tracts of the supratentorial brain, consistent with microvascular disease changes. Small, chronic bilateral basal ganglia lacunar infarcts are seen. Vascular: No hyperdense vessel or unexpected calcification. Skull: Normal. Negative for fracture or focal lesion.  Sinuses/Orbits: There is mild sphenoid sinus mucosal thickening. Other: There is mild left frontal scalp soft tissue swelling. IMPRESSION: 1. Mild left frontal scalp soft tissue swelling without evidence of an acute fracture or acute intracranial abnormality. 2. Small, chronic bilateral basal ganglia lacunar infarcts. Electronically Signed   By: Virgina Norfolk M.D.   On: 12/28/2020 00:00   DG Chest Portable 1 View  Result Date: 12/27/2020 CLINICAL DATA:  Fall today. EXAM: PORTABLE CHEST 1 VIEW COMPARISON:  09/15/2015 FINDINGS: Mild cardiomegaly. Aortic atherosclerosis and tortuosity. There is chronic hyperinflation and bronchitic change. No pneumothorax or pleural effusion. No acute airspace disease. The bones are under mineralized. No acute osseous abnormalities are seen. IMPRESSION: 1. No acute abnormality. 2. Mild  cardiomegaly.  Chronic hyperinflation and bronchitic change. Aortic Atherosclerosis (ICD10-I70.0). Electronically Signed   By: Keith Rake M.D.   On: 12/27/2020 23:40   ____________________________________________  PROCEDURES  Procedure(s) performed:   Marland KitchenMarland KitchenLaceration Repair  Date/Time: 12/28/2020 3:09 AM Performed by: Merrily Pew, MD Authorized by: Merrily Pew, MD   Consent:    Consent obtained:  Verbal   Consent given by:  Patient   Risks discussed:  Infection, need for additional repair, nerve damage, poor wound healing, poor cosmetic result, pain, retained foreign body, tendon damage and vascular damage   Alternatives discussed:  No treatment, delayed treatment and observation Universal protocol:    Patient identity confirmed:  Verbally with patient and arm band Anesthesia:    Anesthesia method:  Local infiltration   Local anesthetic:  Lidocaine 1% WITH epi Laceration details:    Location:  Face   Face location:  Forehead   Length (cm):  3   Depth (mm):  2 Pre-procedure details:    Preparation:  Patient was prepped and draped in usual sterile fashion and  imaging obtained to evaluate for foreign bodies Exploration:    Limited defect created (wound extended): no     Wound exploration: wound explored through full range of motion   Treatment:    Area cleansed with:  Saline   Amount of cleaning:  Extensive   Irrigation solution:  Sterile water   Irrigation volume:  125   Irrigation method:  Syringe   Visualized foreign bodies/material removed: no     Debridement:  None   Undermining:  None Skin repair:    Repair method:  Sutures   Suture size:  5-0   Suture material:  Fast-absorbing gut   Suture technique:  Simple interrupted   Number of sutures:  5 Approximation:    Approximation:  Close Repair type:    Repair type:  Simple Post-procedure details:    Dressing:  Open (no dressing)   Procedure completion:  Tolerated well, no immediate complications  .Sedation  Date/Time: 12/28/2020 3:11 AM Performed by: Merrily Pew, MD Authorized by: Merrily Pew, MD   Consent:    Consent obtained:  Verbal   Consent given by:  Patient   Risks discussed:  Allergic reaction, dysrhythmia, inadequate sedation, nausea, vomiting, respiratory compromise necessitating ventilatory assistance and intubation, prolonged sedation necessitating reversal and prolonged hypoxia resulting in organ damage   Alternatives discussed:  Analgesia without sedation, anxiolysis and regional anesthesia Universal protocol:    Procedure explained and questions answered to patient or proxy's satisfaction: yes     Test results available: yes     Imaging studies available: yes     Immediately prior to procedure, a time out was called: yes   Indications:    Procedure performed:  Fracture reduction   Procedure necessitating sedation performed by:  Physician performing sedation Pre-sedation assessment:    Time since last food or drink:  6   NPO status caution: urgency dictates proceeding with non-ideal NPO status     ASA classification: class 2 - patient with mild systemic  disease     Mouth opening:  2 finger widths   Thyromental distance:  3 finger widths   Mallampati score:  II - soft palate, uvula, fauces visible   Neck mobility: normal     Pre-sedation assessments completed and reviewed: airway patency, cardiovascular function, hydration status, mental status, nausea/vomiting, pain level, respiratory function and temperature   Immediate pre-procedure details:    Reassessment: Patient reassessed immediately prior to procedure  Reviewed: vital signs     Verified: bag valve mask available, emergency equipment available, intubation equipment available, IV patency confirmed, oxygen available and suction available   Procedure details (see MAR for exact dosages):    Preoxygenation:  Nasal cannula   Sedation:  Propofol   Intended level of sedation: deep   Analgesia:  Fentanyl   Intra-procedure monitoring:  Blood pressure monitoring, cardiac monitor, continuous pulse oximetry, continuous capnometry, frequent vital sign checks and frequent LOC assessments   Intra-procedure events: none     Total Provider sedation time (minutes):  32 Post-procedure details:    Attendance: Constant attendance by certified staff until patient recovered     Recovery: Patient returned to pre-procedure baseline     Post-sedation assessments completed and reviewed: airway patency, cardiovascular function, hydration status, mental status, nausea/vomiting, pain level, respiratory function and temperature     Patient is stable for discharge or admission: yes     Procedure completion:  Tolerated well, no immediate complications .Ortho Injury Treatment  Date/Time: 12/28/2020 3:12 AM Performed by: Merrily Pew, MD Authorized by: Merrily Pew, MD   Consent:    Consent obtained:  Verbal   Risks discussed:  Fracture, irreducible dislocation, recurrent dislocation, nerve damage, restricted joint movement, stiffness and vascular damage   Alternatives discussed:  No treatmentInjury  location: wrist Location details: left wrist Injury type: fracture-dislocation Pre-procedure neurovascular assessment: neurovascularly intact Pre-procedure distal perfusion: normal Pre-procedure neurological function: normal Pre-procedure range of motion: reduced Anesthesia: hematoma block  Anesthesia: Local anesthesia used: yes Anesthetic total: 10 mL  Patient sedated: Yes. Refer to sedation procedure documentation for details of sedation. Manipulation performed: yes Skin traction used: no Skeletal traction used: no Reduction successful: yes (improved, not definitive treatment though) X-ray confirmed reduction: yes Immobilization: splint Splint type: sugar tong Splint Applied by: ED Nurse and ED Provider Supplies used: Ortho-Glass Post-procedure neurovascular assessment: post-procedure neurovascularly intact Post-procedure distal perfusion: normal Post-procedure neurological function: normal Post-procedure range of motion: unchanged    ____________________________________________  INITIAL IMPRESSION / ASSESSMENT AND PLAN    This patient presents to the ED for concern of fall secondary to hypoglycemia, this involves an extensive number of treatment options, and is a complaint that carries with it a high risk of complications and morbidity.  The differential diagnosis includes wrist fracture, forehead laceration, intracranial injury.  Additional history obtained:   Additional history obtained from Teressa Senter  Previous records obtained and reviewed in epic  ED Course  Clinical Course as of 12/28/20 0315  Mon Dec 27, 2020  2342 DG Wrist Complete Left Fracture/dislocation. Will need reduction [JM]  2342 DG Chest Portable 1 View Reassurring, no obvious rib fractures, edema, consolidation or significant cardiomegaly.  [JM]  Tue Dec 28, 2020  0017 CT Head Wo Contrast [JM]    Clinical Course User Index [JM] Chidi Shirer, Corene Cornea, MD     Medicines ordered:  Medications   lidocaine-EPINEPHrine (XYLOCAINE W/EPI) 1 %-1:100000 (with pres) injection 20 mL (has no administration in time range)  Tdap (BOOSTRIX) injection 0.5 mL (0.5 mLs Intramuscular Given 12/28/20 0002)  fentaNYL (SUBLIMAZE) injection 50 mcg (50 mcg Intravenous Given 12/28/20 0044)  propofol (DIPRIVAN) 10 mg/mL bolus/IV push 120 mg (120 mg Intravenous Given 12/28/20 0205)  lactated ringers bolus 1,000 mL (1,000 mLs Intravenous New Bag/Given 12/28/20 0207)    Consultations Obtained:   I consulted no one and discussed lab and imaging findings   CRITICAL INTERVENTIONS:  . Procedural sedation for dislocation/fracture reduction . Laceration repair . Imaging  Reevaluation:  After  the interventions stated above, I reevaluated the patient and found improved alignment of wrist but not perfect will likely need surgery.  Will refer to hand surgery.  Laceration repaired at bedside per the note above.  Used absorbable sutures will not need those removed likely.  FINAL IMPRESSION AND PLAN Final diagnoses:  Laceration of forehead, initial encounter  Closed fracture of left wrist, initial encounter   A medical screening exam was performed and I feel the patient has had an appropriate workup for their chief complaint at this time and likelihood of emergent condition existing is low. They have been counseled on decision, DISCHARGE, follow up and which symptoms necessitate immediate return to the emergency department. They or their family verbally stated understanding and agreement with plan and discharged in stable condition.   ____________________________________________   NEW OUTPATIENT MEDICATIONS STARTED DURING THIS VISIT:  New Prescriptions   OXYCODONE-ACETAMINOPHEN (PERCOCET) 5-325 MG TABLET    Take 1 tablet by mouth every 6 (six) hours as needed for severe pain.    Note:  This note was prepared with assistance of Dragon voice recognition software. Occasional wrong-word or sound-a-like  substitutions may have occurred due to the inherent limitations of voice recognition software.   Alisea Matte, Corene Cornea, MD 12/28/20 [phone]

## 2020-12-28 ENCOUNTER — Emergency Department (HOSPITAL_COMMUNITY): Payer: Medicare Other

## 2020-12-28 LAB — CBC WITH DIFFERENTIAL/PLATELET
Abs Immature Granulocytes: 0.04 10*3/uL (ref 0.00–0.07)
Basophils Absolute: 0 10*3/uL (ref 0.0–0.1)
Basophils Relative: 0 %
Eosinophils Absolute: 0 10*3/uL (ref 0.0–0.5)
Eosinophils Relative: 0 %
HCT: 40.5 % (ref 36.0–46.0)
Hemoglobin: 12.7 g/dL (ref 12.0–15.0)
Immature Granulocytes: 1 %
Lymphocytes Relative: 12 %
Lymphs Abs: 1.1 10*3/uL (ref 0.7–4.0)
MCH: 30.4 pg (ref 26.0–34.0)
MCHC: 31.4 g/dL (ref 30.0–36.0)
MCV: 96.9 fL (ref 80.0–100.0)
Monocytes Absolute: 0.6 10*3/uL (ref 0.1–1.0)
Monocytes Relative: 7 %
Neutro Abs: 6.9 10*3/uL (ref 1.7–7.7)
Neutrophils Relative %: 80 %
Platelets: 235 10*3/uL (ref 150–400)
RBC: 4.18 MIL/uL (ref 3.87–5.11)
RDW: 13.2 % (ref 11.5–15.5)
WBC: 8.7 10*3/uL (ref 4.0–10.5)
nRBC: 0 % (ref 0.0–0.2)

## 2020-12-28 LAB — COMPREHENSIVE METABOLIC PANEL
ALT: 21 U/L (ref 0–44)
AST: 32 U/L (ref 15–41)
Albumin: 3.9 g/dL (ref 3.5–5.0)
Alkaline Phosphatase: 65 U/L (ref 38–126)
Anion gap: 13 (ref 5–15)
BUN: 28 mg/dL — ABNORMAL HIGH (ref 8–23)
CO2: 22 mmol/L (ref 22–32)
Calcium: 9.3 mg/dL (ref 8.9–10.3)
Chloride: 98 mmol/L (ref 98–111)
Creatinine, Ser: 1.34 mg/dL — ABNORMAL HIGH (ref 0.44–1.00)
GFR, Estimated: 39 mL/min — ABNORMAL LOW (ref >60)
Glucose, Bld: 195 mg/dL — ABNORMAL HIGH (ref 70–99)
Potassium: 4.2 mmol/L (ref 3.5–5.1)
Sodium: 133 mmol/L — ABNORMAL LOW (ref 135–145)
Total Bilirubin: 0.7 mg/dL (ref 0.3–1.2)
Total Protein: 6.7 g/dL (ref 6.5–8.1)

## 2020-12-28 MED ORDER — PROPOFOL 10 MG/ML IV BOLUS
120.0000 mg | Freq: Once | INTRAVENOUS | Status: AC
  Administered 2020-12-28: 120 mg via INTRAVENOUS
  Filled 2020-12-28: qty 20

## 2020-12-28 MED ORDER — OXYCODONE-ACETAMINOPHEN 5-325 MG PO TABS: 1.0000 | ORAL_TABLET | Freq: Four times a day (QID) | ORAL | 0 refills | Status: DC | PRN

## 2020-12-28 MED ORDER — FENTANYL CITRATE (PF) 100 MCG/2ML IJ SOLN
50.0000 ug | Freq: Once | INTRAMUSCULAR | Status: AC
Start: 2020-12-28 — End: 2020-12-28
  Administered 2020-12-28: 50 ug via INTRAVENOUS
  Filled 2020-12-28: qty 2

## 2020-12-28 MED ORDER — LACTATED RINGERS IV BOLUS
1000.0000 mL | Freq: Once | INTRAVENOUS | Status: AC
  Administered 2020-12-28: 1000 mL via INTRAVENOUS

## 2020-12-28 NOTE — Discharge Instructions (Addendum)
If you are able to control your pain with ibuprofen and, naproxen, Tylenol or other measures please do not get the prescription filled however if the pain is too much to bear than the prescription is for that reason. Please follow-up with orthopedic surgeon whose number is on your paperwork for further management of your wrist fracture. Your sutures should absorb and fall out within 5 to 7 days.  If not follow-up with your doctor and have them removed.

## 2020-12-29 ENCOUNTER — Other Ambulatory Visit: Payer: Self-pay

## 2020-12-29 ENCOUNTER — Encounter: Payer: Self-pay | Admitting: Orthopedic Surgery

## 2020-12-29 ENCOUNTER — Ambulatory Visit: Payer: Medicare Other | Admitting: Orthopedic Surgery

## 2020-12-29 VITALS — BP 153/83 | HR 86 | Ht 61.0 in | Wt 130.0 lb

## 2020-12-29 DIAGNOSIS — S52532A Colles' fracture of left radius, initial encounter for closed fracture: Secondary | ICD-10-CM

## 2020-12-29 NOTE — Progress Notes (Signed)
New Patient Visit  Assessment: Emily Jennings is a 87 y.o. RHD female with the following: Left distal radius fracture   Plan: Emily Jennings sustained a left distal radius fracture that underwent closed reduction in the emergency department under conscious sedation.  She also has a minimally displaced ulnar styloid fracture.  Post reduction x-rays demonstrated acceptable alignment of these fractures.  Her splint was a little long, and this was modified in clinic today.  We will plan to keep her in the current splint for an additional 2 weeks.  After that, we will transition her to a cast.  We discussed the likely progression, including the anticipated recovery.  I do think that she will achieve excellent function once the fracture is healed, although she may have a residual deformity.  All questions were answered, and she is in agreement with this plan.     Follow-up: Return in about 2 weeks (around 01/12/2021).  Subjective:  Chief Complaint  Patient presents with  . Arm Injury    Lt arm DOI 12/28/20    History of Present Illness: Emily Jennings is a 87 y.o.  RHD female who presents for evaluation of a left wrist injury.  She fell 2 days ago, and injured her wrist.  She was evaluated in the emergency department, and her left distal radius fracture was reduced under conscious sedation.  Her pain has been minimal since the reduction.  She has tolerated the splint, although she notes it is tight around her fingers, as well as her elbow.  No numbness or tingling.  No previous fragility fractures   Review of Systems: No fevers or chills No numbness or tingling No chest pain No shortness of breath No bowel or bladder dysfunction No GI distress No headaches   Medical History:  Past Medical History:  Diagnosis Date  . Allergy   . Anxiety   . ANXIETY DISORDER, GENERALIZED 07/15/2007   Qualifier: Diagnosis of  By: Rennie Plowman    . Arthritis   . Cancer [facility]) 2009   breast, carcinoma in  situ left  . Carcinoma in situ of breast 05/21/2008   Qualifier: Diagnosis of  By: Moshe Cipro MD, Margaret  Diagnosed in 2009, completed 5 year course of tamoxifen, no evidence of recurrence   . Carotid stenosis    11/16/2005  mild plaque formation and stenosis proximal right ECA  . Cataract   . Complication of anesthesia   . Coronary artery disease    cardiac catheterization on 03/20/2006  LAD mid 40% stenosis, left circumflex mild 40% stenosis, RCA mid-vessel 40% to 50% lesion   EF 60%  . Diabetes mellitus   . GERD (gastroesophageal reflux disease)   . Hernia, inguinal    left  . Hyperglycemia   . Hypertension   . Insomnia 11/16/2011  . Low blood potassium   . Non-insulin dependent type 2 diabetes mellitus ([place])   . Osteoporosis   . Shortness of breath    2D Echocardiogram 01/26/2009   EF of greater than 55%, mild MR, mild TR, normal ventricular function  . Thickened endometrium 10/26/2017   Noted by gyne in 2017, missed 6 month follow up, referred in 09/2017  . Ventricular tachycardia, non-sustained (HCC)    developed during stress test 02/08/2006, spontaneously aborted, mild reversible apical defect    Past Surgical History:  Procedure Laterality Date  . BREAST LUMPECTOMY Left 2009   Left breast 2009  . CATARACT EXTRACTION W/PHACO Left 10/28/2014   Procedure: PHACO EMULSION CATARACT  EXTRACTION WITH INTRAOCULAR LENS IMPLANT LEFT EYE (IOC);  Surgeon: Marylynn Pearson, MD;  Location: [place];  Service: Ophthalmology;  Laterality: Left;  . COLONOSCOPY    . cyst removed from left foot      Family History  Problem Relation Age of Onset  . Hypertension Mother   . Hyperlipidemia Mother   . Stroke Mother   . Urticaria Mother   . Cancer Father        pancreatic  . Colon cancer Father   . Heart disease Brother 40       bypass  . Heart disease Brother 24       bypass  . Arthritis Other   . Asthma Other   . Diabetes Other   . Colon cancer Paternal Aunt   . Esophageal cancer Neg Hx   .  Stomach cancer Neg Hx   . Rectal cancer Neg Hx    Social History   Tobacco Use  . Smoking status: Never Smoker  . Smokeless tobacco: Never Used  Vaping Use  . Vaping Use: Never used  Substance Use Topics  . Alcohol use: No  . Drug use: No    Allergies  Allergen Reactions  . Benadryl [Diphenhydramine Hcl] Hypertension  . Citalopram   . Metformin And Related Diarrhea  . Tramadol Other (See Comments)    Felt light headed and dizzy    Current Meds  Medication Sig  . acetaminophen (TYLENOL) 500 MG tablet Take one tablet two times daily for 3 days, then as needed  . alendronate (FOSAMAX) 70 MG tablet TAKE 1 TABLET BY MOUTH  EVERY 7 DAYS TAKE WITH A  FULL GLASS OF WATER ON AN  EMPTY STOMACH  . amLODipine (NORVASC) 10 MG tablet TAKE 1 TABLET BY MOUTH EVERY DAY  . aspirin EC 81 MG tablet Take 81 mg by mouth daily.  . blood glucose meter kit and supplies KIT One touch Ultra. Use three times daily as directed. (FOR ICD E11.65)  . cholecalciferol (VITAMIN D3) 25 MCG (1000 UT) tablet Take 1,000 Units by mouth daily.  Marland Kitchen glipiZIDE (GLUCOTROL XL) 10 MG 24 hr tablet Take 10 mg by mouth daily with breakfast.  . glucose blood (ONETOUCH ULTRA) test strip Use as instructed to test blood glucose three times daily. DX E11.65  . insulin lispro protamine-lispro (HUMALOG MIX 75/25) (75-25) 100 UNIT/ML SUSP injection INJECT SUBCUTANEOUSLY 10  UNITS TWICE DAILY (Patient taking differently: INJECT SUBCUTANEOUSLY 15  UNITS in AM 10 unit at [employer])  . Insulin Pen Needle 32G X 4 MM MISC 1 each by Does not apply route as directed.  . Insulin Syringe-Needle U-100 (BD INSULIN SYRINGE U/F) 31G X 5/16" 0.5 ML MISC USE AS DIRECTED TWICE DAILY  . KLOR-CON M20 20 MEQ tablet TAKE 1 TABLET BY MOUTH TWICE A DAY  . losartan (COZAAR) 100 MG tablet Take 1 tablet (100 mg total) by mouth daily.  . metoprolol tartrate (LOPRESSOR) 50 MG tablet TAKE 1 TABLET BY MOUTH TWICE A DAY  . mupirocin ointment (BACTROBAN) 2 % Apply  topically 2 (two) times daily.  Glory Rosebush Delica Lancets 16X MISC 1 each by Does not apply route 3 (three) times daily. Use to check glucose three times daily  . oxyCODONE-acetaminophen (PERCOCET) 5-325 MG tablet Take 1 tablet by mouth every 6 (six) hours as needed for severe pain.  Marland Kitchen RESTASIS 0.05 % ophthalmic emulsion   . rosuvastatin (CRESTOR) 5 MG tablet TAKE 1 TABLET BY MOUTH EVERYDAY AT BEDTIME  . spironolactone (  ALDACTONE) 25 MG tablet TAKE 1 TABLET BY MOUTH EVERY DAY    Objective: BP (!) 153/83   Pulse 86   Ht 5' 1"  (1.549 m)   Wt 130 lb (59 kg)   BMI 24.56 kg/m   Physical Exam:  General: Alert and oriented.  No acute distress. Gait: Normal  Evaluation left upper extremity demonstrates a splint that is clean, dry and intact.  Exposed fingers do have some swelling.  Minimal motion is appreciated due to the length of the splint which extends distal to the MCP joints.  Fingers are warm and well-perfused.    IMAGING: I personally reviewed images previously obtained from the ED  X-ray of the left radius demonstrates a displaced fracture, with dorsal angulation.  There is also a minimally displaced fracture of the ulnar styloid.  Possible widening at the DRUJ.  Postreduction x-rays demonstrates acceptable alignment of the distal radius fracture.  New Medications:  No orders of the defined types were placed in this encounter.     Mordecai Rasmussen, MD  12/29/2020 1:14 PM

## 2021-01-03 ENCOUNTER — Telehealth: Payer: Self-pay

## 2021-01-03 DIAGNOSIS — E782 Mixed hyperlipidemia: Secondary | ICD-10-CM

## 2021-01-03 NOTE — Telephone Encounter (Signed)
Needs fasting lipid panel.

## 2021-01-03 NOTE — Telephone Encounter (Signed)
Pt is calling to see if she needs lab work , also needs to know if its Fasting   Please call the pt

## 2021-01-03 NOTE — Telephone Encounter (Signed)
Pt is aware of Fasting labs

## 2021-01-04 DIAGNOSIS — N1832 Chronic kidney disease, stage 3b: Secondary | ICD-10-CM | POA: Diagnosis not present

## 2021-01-04 NOTE — Addendum Note (Signed)
Addended by: Eual Fines on: 01/04/2021 08:35 AM   Modules accepted: Orders

## 2021-01-05 LAB — BASIC METABOLIC PANEL
BUN/Creatinine Ratio: 16 (ref 12–28)
BUN: 24 mg/dL (ref 8–27)
CO2: 22 mmol/L (ref 20–29)
Calcium: 9.6 mg/dL (ref 8.7–10.3)
Chloride: 99 mmol/L (ref 96–106)
Creatinine, Ser: 1.46 mg/dL — ABNORMAL HIGH (ref 0.57–1.00)
Glucose: 223 mg/dL — ABNORMAL HIGH (ref 65–99)
Potassium: 4.7 mmol/L (ref 3.5–5.2)
Sodium: 140 mmol/L (ref 134–144)
eGFR: 35 mL/min/{1.73_m2} — ABNORMAL LOW (ref >59)

## 2021-01-12 ENCOUNTER — Encounter: Payer: Self-pay | Admitting: Orthopedic Surgery

## 2021-01-12 ENCOUNTER — Other Ambulatory Visit: Payer: Self-pay

## 2021-01-12 ENCOUNTER — Ambulatory Visit (INDEPENDENT_AMBULATORY_CARE_PROVIDER_SITE_OTHER): Payer: Medicare Other | Admitting: Orthopedic Surgery

## 2021-01-12 ENCOUNTER — Ambulatory Visit: Payer: Medicare Other

## 2021-01-12 VITALS — Ht 61.0 in | Wt 130.0 lb

## 2021-01-12 DIAGNOSIS — S52532D Colles' fracture of left radius, subsequent encounter for closed fracture with routine healing: Secondary | ICD-10-CM

## 2021-01-12 DIAGNOSIS — S52532A Colles' fracture of left radius, initial encounter for closed fracture: Secondary | ICD-10-CM

## 2021-01-12 NOTE — Progress Notes (Signed)
Orthopaedic Postop Note  Assessment: Emily Jennings is a 87 y.o. female who sustained a left distal radius fracture, treated in a splint  Plan: Repeat radiographs demonstrates maintenance of alignment.  There is some intra-articular involvement, but overall alignment looks good.  She has tolerated the splint.  We will plan to place her in a short arm cast at this point, and evaluate her in 2-3 weeks.  She tolerated casting well.  Cast application -left short arm cast   Verbal consent was obtained and the correct extremity was identified. A well padded, appropriately molded short arm cast was applied to the left arm Fingers remained warm and well perfused.   There were no sharp edges Patient tolerated the procedure well Cast care instructions were provided    Follow-up: Return in about 2 weeks (around 01/26/2021). XR at next visit: Left wrist out of the cast  Subjective:  Chief Complaint  Patient presents with  . Fracture    Lt wrist fx DOI 12/27/20    History of Present Illness: Emily Jennings is a 87 y.o. female who presents following the above stated injury.  She has done well in her splint.  Her pain has been controlled.  She does note some swelling in her fingers.  As result, she does have some decreased sensation.  No issues otherwise.  Review of Systems: No fevers or chills No numbness or tingling No Chest Pain No shortness of breath   Objective: Ht 5\' 1"  (1.549 m)   Wt 130 lb (59 kg)   BMI 24.56 kg/m   Physical Exam:  Elderly female.  No acute distress.  After removal of the splint, there is minimal swelling about the wrist.  However, her fingers are diffusely swollen.  Slightly decreased sensation to the swollen fingers.  There is no skin breakdown underneath the splint.  Tenderness to palpation at the distal radius.  Fingers are warm and well-perfused.  IMAGING: I personally ordered and reviewed the following images:  X-ray of the left wrist was obtained in  clinic today and demonstrates maintenance of alignment.  There is been no interval displacement.  Overall radial tilt, radial height and inclination are all within acceptable limits.  Interval callus formation around the fracture.  Impression: Left distal radius fracture in acceptable alignment.   Mordecai Rasmussen, MD 01/12/2021 9:46 PM

## 2021-01-12 NOTE — Patient Instructions (Signed)
General Cast Instructions  1.  You were placed in a cast in clinic today.  Please keep the cast material clean, dry and intact.  Please do not use anything to itch the under the cast.  If it gets itchy, you can consider taking benadryl, or similar medication.  If the cast material gets wet, place it on a towel and use a hair dryer on a low setting. 2.  Tylenol or Ibuprofen/Naproxen as needed.   3.  Recommend elevating your extremity as much as possible to help with swelling. 4.  F/u 2-3 weeks, cast off and repeat XR  

## 2021-01-13 ENCOUNTER — Encounter: Payer: Self-pay | Admitting: Family Medicine

## 2021-01-13 ENCOUNTER — Ambulatory Visit (INDEPENDENT_AMBULATORY_CARE_PROVIDER_SITE_OTHER): Payer: Medicare Other | Admitting: Family Medicine

## 2021-01-13 VITALS — BP 162/74 | HR 75 | Resp 15 | Ht 61.0 in | Wt 126.1 lb

## 2021-01-13 DIAGNOSIS — Z794 Long term (current) use of insulin: Secondary | ICD-10-CM

## 2021-01-13 DIAGNOSIS — I10 Essential (primary) hypertension: Secondary | ICD-10-CM

## 2021-01-13 DIAGNOSIS — E782 Mixed hyperlipidemia: Secondary | ICD-10-CM | POA: Diagnosis not present

## 2021-01-13 DIAGNOSIS — I2510 Atherosclerotic heart disease of native coronary artery without angina pectoris: Secondary | ICD-10-CM

## 2021-01-13 DIAGNOSIS — R55 Syncope and collapse: Secondary | ICD-10-CM | POA: Diagnosis not present

## 2021-01-13 DIAGNOSIS — E1122 Type 2 diabetes mellitus with diabetic chronic kidney disease: Secondary | ICD-10-CM | POA: Diagnosis not present

## 2021-01-13 DIAGNOSIS — N1831 Chronic kidney disease, stage 3a: Secondary | ICD-10-CM | POA: Diagnosis not present

## 2021-01-13 MED ORDER — ROSUVASTATIN CALCIUM 5 MG PO TABS: 5.0000 mg | ORAL_TABLET | Freq: Every day | ORAL | 3 refills | Status: DC

## 2021-01-13 NOTE — Progress Notes (Signed)
   Emily Jennings     MRN: [id number]      DOB: 07-07-1934   HPI Emily Jennings is here for follow up and re-evaluation of chronic medical conditions, medication management and review of any available recent lab and radiology data.  Had a syncopal episode at home, resulting in laceration to her forehead and fractured forearm. Thinks her sugar may have fallen and does state that at times she feels as though she is not eating " enough". Most recent HBa1C was 7.1  ROS Denies recent fever or chills. Denies sinus pressure, nasal congestion, ear pain or sore throat. Denies chest congestion, productive cough or wheezing. Denies chest pains, palpitations and leg swelling Denies abdominal pain, nausea, vomiting,diarrhea or constipation.   Denies dysuria, frequency, hesitancy or incontinence. Chronic  joint pain, swelling and limitation in mobility. Denies headaches, seizures, numbness, or tingling. Denies depression, anxiety or insomnia. Denies skin break down or rash.   PE  BP (!) 162/74   Pulse 75   Resp 15   Ht 5\' 1"  (1.549 m)   Wt 126 lb 1.9 oz (57.2 kg)   SpO2 97%   BMI 23.83 kg/m   Patient alert and oriented and in no cardiopulmonary distress.  HEENT: No facial asymmetry, EOMI,     Neck supple .Bruit present  Chest: Clear to auscultation bilaterally.  CVS: S1, S2 no murmurs, no S3.Regular rate.  ABD: Soft non tender.   Ext: No edema  MS: Adequate ROM spine, shoulders, hips and knees.  Skin: Intact, no ulcerations or rash noted.  Psych: Good eye contact, normal affect. Memory intact not anxious or depressed appearing.  CNS: CN 2-12 intact, power,  normal throughout.no focal deficits noted.   Assessment & Plan  Essential hypertension Elevated , no change in medication, re eval in 6 weeks, notably hydralazine is missing at the visit, uncertain if she is taking it DASH diet and commitment to daily physical activity for a minimum of 30 minutes discussed and encouraged, as a  part of hypertension management. The importance of attaining a healthy weight is also discussed.  BP/Weight 01/13/2021 01/12/2021 12/29/2020 12/28/2020 12/09/2020 9/97/7414 10/21/9530  Systolic BP 023 - 343 568 616 837 290  Diastolic BP 74 - 83 69 72 90 82  Wt. (Lbs) 126.12 130 130 - 130.12 133.6 134.2  BMI 23.83 24.56 24.56 - 24.59 25.24 25.78       Mixed hyperlipidemia Hyperlipidemia:Low fat diet discussed and encouraged.   Lipid Panel  Lab Results  Component Value Date   CHOL 207 (H) 06/29/2020   HDL 115 06/29/2020   [place] 80 06/29/2020   TRIG 50 06/29/2020   CHOLHDL 1.8 06/29/2020   Needs to reduce fat in diet    Type 2 diabetes mellitus with stage 3a chronic kidney disease, with long-term current use of insulin (HCC) Managed by Endo wil alert Nd to recent syncopal event  CVD (cardiovascular disease) Needs carotid doppler to eval for vascular disease/ stenosis

## 2021-01-13 NOTE — Patient Instructions (Addendum)
F/U in 6 to 8 weeks , re evaluate blood pressure, please bring ALL medication to next visit  New is crestor 5 mg one at bedtime  I will let Dr Dorris Fetch know that you passed out   You are referred for an [place] to look at the circulation  To your brain  Very important that you eat regularly and that you eat enough at each meal

## 2021-01-16 ENCOUNTER — Encounter: Payer: Self-pay | Admitting: Family Medicine

## 2021-01-16 NOTE — Assessment & Plan Note (Addendum)
Hyperlipidemia:Low fat diet discussed and encouraged. Start low dos crestor   Lipid Panel  Lab Results  Component Value Date   CHOL 207 (H) 06/29/2020   HDL 115 06/29/2020   LDLCALC 80 06/29/2020   TRIG 50 06/29/2020   CHOLHDL 1.8 06/29/2020   Needs to reduce fat in diet

## 2021-01-16 NOTE — Assessment & Plan Note (Signed)
Needs carotid doppler to eval for vascular disease/ stenosis

## 2021-01-16 NOTE — Assessment & Plan Note (Signed)
Managed by Endo wil alert Nd to recent syncopal event

## 2021-01-16 NOTE — Assessment & Plan Note (Signed)
Elevated , no change in medication, re eval in 6 weeks, notably hydralazine is missing at the visit, uncertain if she is taking it DASH diet and commitment to daily physical activity for a minimum of 30 minutes discussed and encouraged, as a part of hypertension management. The importance of attaining a healthy weight is also discussed.  BP/Weight 01/13/2021 01/12/2021 12/29/2020 12/28/2020 12/09/2020 04/15/210 09/22/5206  Systolic BP 022 - 336 122 449 753 005  Diastolic BP 74 - 83 69 72 90 82  Wt. (Lbs) 126.12 130 130 - 130.12 133.6 134.2  BMI 23.83 24.56 24.56 - 24.59 25.24 25.78

## 2021-01-19 ENCOUNTER — Other Ambulatory Visit: Payer: Self-pay

## 2021-01-19 ENCOUNTER — Ambulatory Visit (INDEPENDENT_AMBULATORY_CARE_PROVIDER_SITE_OTHER): Payer: Medicare Other | Admitting: Orthopedic Surgery

## 2021-01-19 DIAGNOSIS — Z4689 Encounter for fitting and adjustment of other specified devices: Secondary | ICD-10-CM

## 2021-01-19 DIAGNOSIS — S52532D Colles' fracture of left radius, subsequent encounter for closed fracture with routine healing: Secondary | ICD-10-CM

## 2021-01-20 ENCOUNTER — Encounter: Payer: Self-pay | Admitting: Orthopedic Surgery

## 2021-01-20 NOTE — Progress Notes (Signed)
Orthopaedic Postop Note  Assessment: Emily Jennings is a 87 y.o. female who sustained a left distal radius fracture, treated in a splint  Plan: Cast evaluated, and trimmed appropriately.  Moleskin was placed in the area is causing the abrasion.  Patient was content with the improvements.  If she has any further issues she should return to clinic earlier than scheduled.   Follow-up: No follow-ups on file. XR at next visit: Left wrist out of the cast  Subjective:  Chief Complaint  Patient presents with  . Cast Problem    Left wrist     History of Present Illness: Emily Jennings is a 87 y.o. female who presents following the above stated injury.  She was casted last week, and has tolerated the cast well.  She noted some sharp areas of the cast, as well as some rubbing of the cast material on her thumb.  She presents for an adjustment of the left short arm cast.  Review of Systems: No fevers or chills No numbness or tingling No Chest Pain No shortness of breath   Objective: There were no vitals taken for this visit.  Physical Exam:  Evaluation of the cast on the left forearm and wrist demonstrates some sharp areas.  No skin breakdown in this area.  The skin in the first webspace is intact without obvious breakdown.  IMAGING: I personally ordered and reviewed the following images:  No new imaging obtained today.   Mordecai Rasmussen, MD 01/20/2021 10:25 AM

## 2021-01-21 ENCOUNTER — Other Ambulatory Visit: Payer: Self-pay | Admitting: Family Medicine

## 2021-01-24 ENCOUNTER — Ambulatory Visit (HOSPITAL_COMMUNITY)
Admission: RE | Admit: 2021-01-24 | Discharge: 2021-01-24 | Disposition: A | Discharge status: Home / Self Care | Payer: Medicare Other | Source: Ambulatory Visit | Attending: Family Medicine | Admitting: Family Medicine

## 2021-01-24 DIAGNOSIS — R55 Syncope and collapse: Secondary | ICD-10-CM | POA: Insufficient documentation

## 2021-01-24 DIAGNOSIS — I6523 Occlusion and stenosis of bilateral carotid arteries: Secondary | ICD-10-CM | POA: Diagnosis not present

## 2021-01-26 ENCOUNTER — Other Ambulatory Visit: Payer: Self-pay

## 2021-01-26 ENCOUNTER — Ambulatory Visit: Payer: Medicare Other

## 2021-01-26 ENCOUNTER — Ambulatory Visit (INDEPENDENT_AMBULATORY_CARE_PROVIDER_SITE_OTHER): Payer: Medicare Other | Admitting: Orthopedic Surgery

## 2021-01-26 ENCOUNTER — Encounter: Payer: Self-pay | Admitting: Orthopedic Surgery

## 2021-01-26 VITALS — Ht 61.0 in | Wt 126.0 lb

## 2021-01-26 DIAGNOSIS — S52532D Colles' fracture of left radius, subsequent encounter for closed fracture with routine healing: Secondary | ICD-10-CM

## 2021-01-26 NOTE — Progress Notes (Signed)
Orthopaedic Postop Note  Assessment: Emily Jennings is a 87 y.o. female who sustained a left distal radius fracture, treated in a splint  Plan: Healing well.  Pain is improving. Plan for one more cast.  Elevate hand to improve swelling.  Wiggle fingers.  F/u 2 weeks.  Cast application - left short arm cast   Verbal consent was obtained and the correct extremity was identified. A well padded, appropriately molded short arm cast was applied to the left arm Fingers remained warm and well perfused.   There were no sharp edges Patient tolerated the procedure well Cast care instructions were provided    Follow-up: No follow-ups on file. XR at next visit: Left wrist out of the cast  Subjective:  Chief Complaint  Patient presents with  . Fracture    Lt radial fx DOI 12/27/20    History of Present Illness: Emily Jennings is a 87 y.o. female who presents following the above stated injury.  Injury sustained a month ago.  She is tolerating the cast, but does not like it.  Sensation intact.  Pain is controlled.  She has swelling in her hand.  Pain with movement of the fingers.   Review of Systems: No fevers or chills No numbness or tingling No Chest Pain No shortness of breath   Objective: Ht 5\' 1"  (1.549 m)   Wt 126 lb (57.2 kg)   BMI 23.81 kg/m   Physical Exam:  Alert and oriented.  No acute distress  Swelling to all fingers.  Able to flex and extend all of her fingers.  EPL intact.  Sensation to dorsal 1st webspace, index finger and small finger intact.  Tenderness at the distal radius.  Mild ecchymosis.   IMAGING: I personally ordered and reviewed the following images:  XR of the left wrist demonstrates distal radius fracture, extra-articular.  No interval displacement.  Neutral tilt of the joint surface.  Interval consolidation.   Impression: Left distal radius fracture in acceptable alignment.    Mordecai Rasmussen, MD 01/26/2021 12:03 PM

## 2021-01-26 NOTE — Patient Instructions (Signed)

## 2021-02-02 ENCOUNTER — Other Ambulatory Visit: Payer: Self-pay | Admitting: "Endocrinology

## 2021-02-02 DIAGNOSIS — N1831 Chronic kidney disease, stage 3a: Secondary | ICD-10-CM

## 2021-02-02 DIAGNOSIS — Z794 Long term (current) use of insulin: Secondary | ICD-10-CM

## 2021-02-09 ENCOUNTER — Other Ambulatory Visit: Payer: Self-pay

## 2021-02-09 ENCOUNTER — Ambulatory Visit: Payer: Medicare Other

## 2021-02-09 ENCOUNTER — Ambulatory Visit (INDEPENDENT_AMBULATORY_CARE_PROVIDER_SITE_OTHER): Payer: Medicare Other | Admitting: Orthopedic Surgery

## 2021-02-09 ENCOUNTER — Encounter: Payer: Self-pay | Admitting: Orthopedic Surgery

## 2021-02-09 VITALS — Ht 61.0 in | Wt 126.0 lb

## 2021-02-09 DIAGNOSIS — S52532D Colles' fracture of left radius, subsequent encounter for closed fracture with routine healing: Secondary | ICD-10-CM

## 2021-02-09 NOTE — Progress Notes (Signed)
Orthopaedic Postop Note  Assessment: Emily Jennings is a 87 y.o. female who sustained a left distal radius fracture, treated in a splint  Plan: She has done well.  Repeat radiographs demonstrates healing wihtout interval displacement.  Transition to a removable wrist splint.  Work on ROM.  No lifting greater than a coffee cup.     Follow-up: Return in about 4 weeks (around 03/09/2021). XR at next visit: Left wrist out of the cast  Subjective:  Chief Complaint  Patient presents with  . Fracture    Lt wrist fx DOI: 12/27/20    History of Present Illness: Emily Jennings is a 87 y.o. female who presents following the above stated injury.  She has done well.  She has been immobilized for 6 weeks.  Pain is controlled.  Complains of continued swelling of the fingers.  No numbness or tingling.   Review of Systems: No fevers or chills No numbness or tingling No Chest Pain No shortness of breath   Objective: Ht 5\' 1"  (1.549 m)   Wt 126 lb (57.2 kg)   BMI 23.81 kg/m   Physical Exam:  Alert and oriented.  No acute distress  Swelling to all fingers.  Able to flex and extend all of her fingers.  EPL intact.  Sensation to dorsal 1st webspace, index finger and small finger intact.  Tenderness at the distal radius.  Mild ecchymosis.   IMAGING: I personally ordered and reviewed the following images:  XR of the left wrist without interval displacement.  Callus formation.  Slight radial translation, but height is acceptable.  Loss of volar tilt.  Unchanged  Impression: Left distal radius fracture in acceptable alignment.      Mordecai Rasmussen, MD 02/09/2021 11:56 AM

## 2021-03-02 ENCOUNTER — Ambulatory Visit: Payer: Medicare Other | Admitting: Allergy & Immunology

## 2021-03-09 ENCOUNTER — Other Ambulatory Visit: Payer: Self-pay

## 2021-03-09 ENCOUNTER — Encounter: Payer: Self-pay | Admitting: Orthopedic Surgery

## 2021-03-09 ENCOUNTER — Ambulatory Visit (INDEPENDENT_AMBULATORY_CARE_PROVIDER_SITE_OTHER): Payer: Medicare Other | Admitting: Orthopedic Surgery

## 2021-03-09 VITALS — Ht 61.0 in | Wt 126.0 lb

## 2021-03-09 DIAGNOSIS — S52532D Colles' fracture of left radius, subsequent encounter for closed fracture with routine healing: Secondary | ICD-10-CM

## 2021-03-09 NOTE — Progress Notes (Signed)
Orthopaedic Postop Note  Assessment: Emily Jennings is a 87 y.o. female who sustained a left distal radius fracture, treated in a splint  Plan: Her left wrist continues to improve.  She tolerates flexion and extension in her wrist.  However, her fingers have become stiff.  She has residual swelling.  She does note some pain with trying to achieve a full clenched fist.  As a result, I recommended occupational therapy.  A referral was placed today.  We will see her back in 6 weeks for repeat evaluation and range of motion check.  If she has any issues in the interim, she should contact the clinic.   Follow-up: Return in about 6 weeks (around 04/20/2021). XR at next visit: None  Subjective:  Chief Complaint  Patient presents with   Fracture    Lt wrist fracture DOI 12/27/20    History of Present Illness: Emily Jennings is a 87 y.o. female who presents following the above stated injury.  She was last seen in clinic approximately 4 weeks ago.  She has been using a splint at all times since then.  She has been removing the splint to work on some range of motion exercises.  Her wrist is doing well.  However, she is more concerned about her fingers.  She has some swelling and stiffness, primarily to the PIP joints of all fingers.  She is unable to make a full fist.  No bruising or swelling about the wrist.  Review of Systems: No fevers or chills No numbness or tingling No Chest Pain No shortness of breath   Objective: Ht 5\' 1"  (1.549 m)   Wt 126 lb (57.2 kg)   BMI 23.81 kg/m   Physical Exam:  Alert and oriented.  No acute distress  Minimal tenderness to palpation about the wrist.  No bruising is appreciated.  No swelling of the wrist.  Her fingers remain mildly swollen.  She is tenderness to palpation of the PIP joint to all fingers.  1 cm short of a clenched fist.  Forcing a clenched fist does recreate some pain in her fingers.  Fingers are warm and well-perfused.  Sensation is intact  all fingers.   IMAGING: I personally ordered and reviewed the following images:  No new imaging obtained today.   Mordecai Rasmussen, MD 03/09/2021 5:07 PM

## 2021-03-10 ENCOUNTER — Ambulatory Visit (INDEPENDENT_AMBULATORY_CARE_PROVIDER_SITE_OTHER): Payer: Medicare Other | Admitting: Family Medicine

## 2021-03-10 VITALS — BP 171/78 | HR 67 | Resp 16 | Ht 61.0 in | Wt 127.8 lb

## 2021-03-10 DIAGNOSIS — E782 Mixed hyperlipidemia: Secondary | ICD-10-CM

## 2021-03-10 DIAGNOSIS — I10 Essential (primary) hypertension: Secondary | ICD-10-CM | POA: Diagnosis not present

## 2021-03-10 DIAGNOSIS — E1122 Type 2 diabetes mellitus with diabetic chronic kidney disease: Secondary | ICD-10-CM | POA: Diagnosis not present

## 2021-03-10 DIAGNOSIS — Z794 Long term (current) use of insulin: Secondary | ICD-10-CM | POA: Diagnosis not present

## 2021-03-10 DIAGNOSIS — N1831 Chronic kidney disease, stage 3a: Secondary | ICD-10-CM | POA: Diagnosis not present

## 2021-03-10 MED ORDER — AMLODIPINE BESYLATE 2.5 MG PO TABS: ORAL_TABLET | ORAL | 2 refills | Status: DC

## 2021-03-10 MED ORDER — MUPIROCIN 2 % EX OINT: TOPICAL_OINTMENT | Freq: Two times a day (BID) | CUTANEOUS | 1 refills | Status: DC

## 2021-03-10 NOTE — Patient Instructions (Addendum)
F/u end August, call if you need me before   Blood  pressure is still too high  Please take all the medications that you are currently taking, morning meds are at 10 :30 am  New additional medication for blood pressure is amlodipine 2.5 mg at 10: 30 pm  Fasting lipid, cmp and eGFr 4 days before next visit  It is important that you exercise regularly at least 30 minutes 5 times a week. If you develop chest pain, have severe difficulty breathing, or feel very tired, stop exercising immediately and seek medical attention   Thanks for choosing [facility], we consider it a privelige to serve you.

## 2021-03-13 ENCOUNTER — Encounter: Payer: Self-pay | Admitting: Family Medicine

## 2021-03-13 NOTE — Progress Notes (Signed)
DOSIA YODICE     MRN: [id number]      DOB: 1933/09/21   HPI Ms. Donahoo is here for follow up and re-evaluation of chronic medical conditions, medication management and review of any available recent lab and radiology data.  Preventive health is updated, specifically  Cancer screening and Immunization.   Questions or concerns regarding consultations or procedures which the PT has had in the interim are  addressed. The PT denies any adverse reactions to current medications since the last visit.  There are no new concerns.  There are no specific complaints  Denies polyuria, polydipsia, blurred vision , or hypoglycemic episodes.   ROS Denies recent fever or chills. Denies sinus pressure, nasal congestion, ear pain or sore throat. Denies chest congestion, productive cough or wheezing. Denies chest pains, palpitations and leg swelling Denies abdominal pain, nausea, vomiting,diarrhea or constipation.   Denies dysuria, frequency, hesitancy or incontinence. Denies uncontrolled  joint pain, swelling and limitation in mobility. Denies headaches, seizures, numbness, or tingling. Denies depression, anxiety or insomnia. Denies skin break down or rash.   PE  BP (!) 171/78   Pulse 67   Resp 16   Ht 5\' 1"  (1.549 m)   Wt 127 lb 12.8 oz (58 kg)   SpO2 96%   BMI 24.15 kg/m   Patient alert and oriented and in no cardiopulmonary distress.  HEENT: No facial asymmetry, EOMI,     Neck supple .  Chest: Clear to auscultation bilaterally.  CVS: S1, S2 no murmurs, no S3.Regular rate.  ABD: Soft non tender.   Ext: No edema  MS: Adequate though reduced ROM spine, shoulders, hips and knees.  Skin: Intact, no ulcerations or rash noted.  Psych: Good eye contact, normal affect. Memory intact not anxious or depressed appearing.  CNS: CN 2-12 intact, power,  normal throughout.no focal deficits noted.   Assessment & Plan  Essential hypertension Uncontrolled, add amlodipine 2.5 mg in the  eveneng DASH diet and commitment to daily physical activity for a minimum of 30 minutes discussed and encouraged, as a part of hypertension management. The importance of attaining a healthy weight is also discussed.  BP/Weight 03/10/2021 03/09/2021 02/09/2021 01/26/2021 01/13/2021 01/12/2021 04/02/9677  Systolic BP 938 - - - 101 - 751  Diastolic BP 78 - - - 74 - 83  Wt. (Lbs) 127.8 126 126 126 126.12 130 130  BMI 24.15 23.81 23.81 23.81 23.83 24.56 24.56       Mixed hyperlipidemia Hyperlipidemia:Low fat diet discussed and encouraged.   Lipid Panel  Lab Results  Component Value Date   CHOL 207 (H) 06/29/2020   HDL 115 06/29/2020   LDLCALC 80 06/29/2020   TRIG 50 06/29/2020   CHOLHDL 1.8 06/29/2020   Updated lab needed at/ before next visit.     Type 2 diabetes mellitus with stage 3a chronic kidney disease, with long-term current use of insulin [facility]) Ms. Perl is reminded of the importance of commitment to daily physical activity for 30 minutes or more, as able and the need to limit carbohydrate intake to 30 to 60 grams per meal to help with blood sugar control.   The need to take medication as prescribed, test blood sugar as directed, and to call between visits if there is a concern that blood sugar is uncontrolled is also discussed.   Ms. Knowlton is reminded of the importance of daily foot exam, annual eye examination, and good blood sugar, blood pressure and cholesterol control. Controlled and managed by Endo  Diabetic Labs Latest Ref Rng & Units 01/04/2021 12/27/2020 12/07/2020 11/26/2020 11/17/2020  HbA1c 0.0 - 7.0 % - - - - 7.1(A)  Microalbumin mg/L - - - - 150  Micro/Creat Ratio <30 mcg/mg creat - - - - -  Chol <200 mg/dL - - - - -  HDL > OR = 50 mg/dL - - - - -  Calc LDL mg/dL (calc) - - - - -  Triglycerides <150 mg/dL - - - - -  Creatinine 0.57 - 1.00 mg/dL 1.46(H) 1.34(H) 1.30(H) 1.49(H) -   BP/Weight 03/10/2021 03/09/2021 02/09/2021 01/26/2021 01/13/2021 01/12/2021 5/99/2341   Systolic BP 443 - - - 601 - 658  Diastolic BP 78 - - - 74 - 83  Wt. (Lbs) 127.8 126 126 126 126.12 130 130  BMI 24.15 23.81 23.81 23.81 23.83 24.56 24.56   Foot/eye exam completion dates Latest Ref Rng & Units 08/26/2020 07/01/2019  Eye Exam No Retinopathy - -  Foot Form Completion - Done Done

## 2021-03-13 NOTE — Assessment & Plan Note (Signed)
Hyperlipidemia:Low fat diet discussed and encouraged.   Lipid Panel  Lab Results  Component Value Date   CHOL 207 (H) 06/29/2020   HDL 115 06/29/2020   LDLCALC 80 06/29/2020   TRIG 50 06/29/2020   CHOLHDL 1.8 06/29/2020   Updated lab needed at/ before next visit.

## 2021-03-13 NOTE — Assessment & Plan Note (Signed)
Emily Jennings is reminded of the importance of commitment to daily physical activity for 30 minutes or more, as able and the need to limit carbohydrate intake to 30 to 60 grams per meal to help with blood sugar control.   The need to take medication as prescribed, test blood sugar as directed, and to call between visits if there is a concern that blood sugar is uncontrolled is also discussed.   Emily Jennings is reminded of the importance of daily foot exam, annual eye examination, and good blood sugar, blood pressure and cholesterol control. Controlled and managed by Endo  Diabetic Labs Latest Ref Rng & Units 01/04/2021 12/27/2020 12/07/2020 11/26/2020 11/17/2020  HbA1c 0.0 - 7.0 % - - - - 7.1(A)  Microalbumin mg/L - - - - 150  Micro/Creat Ratio <30 mcg/mg creat - - - - -  Chol <200 mg/dL - - - - -  HDL > OR = 50 mg/dL - - - - -  Calc LDL mg/dL (calc) - - - - -  Triglycerides <150 mg/dL - - - - -  Creatinine 0.57 - 1.00 mg/dL 1.46(H) 1.34(H) 1.30(H) 1.49(H) -   BP/Weight 03/10/2021 03/09/2021 02/09/2021 01/26/2021 01/13/2021 01/12/2021 8/46/6599  Systolic BP 357 - - - 017 - 793  Diastolic BP 78 - - - 74 - 83  Wt. (Lbs) 127.8 126 126 126 126.12 130 130  BMI 24.15 23.81 23.81 23.81 23.83 24.56 24.56   Foot/eye exam completion dates Latest Ref Rng & Units 08/26/2020 07/01/2019  Eye Exam No Retinopathy - -  Foot Form Completion - Done Done

## 2021-03-13 NOTE — Assessment & Plan Note (Signed)
Uncontrolled, add amlodipine 2.5 mg in the eveneng DASH diet and commitment to daily physical activity for a minimum of 30 minutes discussed and encouraged, as a part of hypertension management. The importance of attaining a healthy weight is also discussed.  BP/Weight 03/10/2021 03/09/2021 02/09/2021 01/26/2021 01/13/2021 01/12/2021 05/21/2839  Systolic BP 698 - - - 614 - 830  Diastolic BP 78 - - - 74 - 83  Wt. (Lbs) 127.8 126 126 126 126.12 130 130  BMI 24.15 23.81 23.81 23.81 23.83 24.56 24.56

## 2021-03-14 ENCOUNTER — Encounter (HOSPITAL_COMMUNITY): Payer: Self-pay

## 2021-03-14 ENCOUNTER — Ambulatory Visit (HOSPITAL_COMMUNITY): Payer: Medicare Other | Attending: Orthopedic Surgery

## 2021-03-14 ENCOUNTER — Other Ambulatory Visit: Payer: Self-pay

## 2021-03-14 DIAGNOSIS — R278 Other lack of coordination: Secondary | ICD-10-CM | POA: Diagnosis not present

## 2021-03-14 DIAGNOSIS — M25632 Stiffness of left wrist, not elsewhere classified: Secondary | ICD-10-CM | POA: Diagnosis not present

## 2021-03-14 DIAGNOSIS — R29898 Other symptoms and signs involving the musculoskeletal system: Secondary | ICD-10-CM | POA: Diagnosis not present

## 2021-03-14 DIAGNOSIS — M25532 Pain in left wrist: Secondary | ICD-10-CM | POA: Diagnosis not present

## 2021-03-14 NOTE — Patient Instructions (Signed)
Complete these exercises 2 times a day. You can do more if you want. 1 set of 10 repetitions for each.   AROM: Finger Flexion / Extension   Actively bend fingers of right hand. Start with knuckles furthest from palm, and slowly make a fist. Hold __3__ seconds. Relax. Then straighten fingers as far as possible. Repeat __10__ times per set.   Copyright  VHI. All rights reserved.   Paper Crumpling Exercise   Begin with right palm down on piece of paper. Maintaining contact between surface and heel of hand, crumple paper into a ball. Repeat ___10_ times per set.   Copyright  VHI. All rights reserved.     Towel Roll Squeeze   With right forearm resting on surface, gently squeeze towel. Repeat __10__ times per set.  Copyright  VHI. All rights reserved.   Abduction / Adduction (Active)    With hand flat on table, spread all fingers apart, then bring them together as close as possible. Repeat __10__ times.   Copyright  VHI. All rights reserved.  AROM: Thumb Abduction / Adduction   Actively pull right thumb away from palm as far as possible. Hold ___3_ seconds. Then bring thumb back to touch fingers. Try not to bend fingers toward thumb. Repeat ___10_ times per set.   Copyright  VHI. All rights reserved.

## 2021-03-15 ENCOUNTER — Ambulatory Visit: Payer: Medicare Other | Admitting: "Endocrinology

## 2021-03-15 ENCOUNTER — Encounter: Payer: Self-pay | Admitting: "Endocrinology

## 2021-03-15 VITALS — BP 152/66 | HR 68 | Ht 61.0 in | Wt 129.6 lb

## 2021-03-15 DIAGNOSIS — E1122 Type 2 diabetes mellitus with diabetic chronic kidney disease: Secondary | ICD-10-CM

## 2021-03-15 DIAGNOSIS — N1831 Chronic kidney disease, stage 3a: Secondary | ICD-10-CM | POA: Diagnosis not present

## 2021-03-15 DIAGNOSIS — Z794 Long term (current) use of insulin: Secondary | ICD-10-CM | POA: Diagnosis not present

## 2021-03-15 LAB — POCT GLYCOSYLATED HEMOGLOBIN (HGB A1C): HbA1c, POC (controlled diabetic range): 6.9 % (ref 0.0–7.0)

## 2021-03-15 NOTE — Patient Instructions (Signed)

## 2021-03-15 NOTE — Progress Notes (Signed)
03/15/2021  Endocrinology follow-up note   Subjective:    Patient ID: Emily Jennings, female    DOB: 11-30-1933,    Past Medical History:  Diagnosis Date   Allergy    Anxiety    ANXIETY DISORDER, GENERALIZED 07/15/2007   Qualifier: Diagnosis of  By: Rennie Plowman     Arthritis    Cancer [facility]) 2009   breast, carcinoma in situ left   Carcinoma in situ of breast 05/21/2008   Qualifier: Diagnosis of  By: Moshe Cipro MD, Margaret  Diagnosed in 2009, completed 5 year course of tamoxifen, no evidence of recurrence    Carotid stenosis    11/16/2005  mild plaque formation and stenosis proximal right ECA   Cataract    Complication of anesthesia    Coronary artery disease    cardiac catheterization on 03/20/2006  LAD mid 40% stenosis, left circumflex mild 40% stenosis, RCA mid-vessel 40% to 50% lesion   EF 60%   Diabetes mellitus    GERD (gastroesophageal reflux disease)    Hernia, inguinal    left   Hyperglycemia    Hypertension    Insomnia 11/16/2011   Low blood potassium    Non-insulin dependent type 2 diabetes mellitus ([place])    Osteoporosis    Shortness of breath    2D Echocardiogram 01/26/2009   EF of greater than 55%, mild MR, mild TR, normal ventricular function   Thickened endometrium 10/26/2017   Noted by gyne in 2017, missed 6 month follow up, referred in 09/2017   Ventricular tachycardia, non-sustained ([place])    developed during stress test 02/08/2006, spontaneously aborted, mild reversible apical defect   Past Surgical History:  Procedure Laterality Date   BREAST LUMPECTOMY Left 2009   Left breast 2009   CATARACT EXTRACTION W/PHACO Left 10/28/2014   Procedure: PHACO EMULSION CATARACT EXTRACTION WITH INTRAOCULAR LENS IMPLANT LEFT EYE ([place]);  Surgeon: Marylynn Pearson, MD;  Location: [facility];  Service: Ophthalmology;  Laterality: Left;   COLONOSCOPY     cyst removed from left foot     Social History   Socioeconomic History   Marital status: Married    Spouse name: Not on file    Number of children: 2   Years of education: na   Highest education level: Some college, no degree  Occupational History   Occupation: retired    Fish farm manager: retired  Tobacco Use   Smoking status: Never   Smokeless tobacco: Never  Scientific laboratory technician Use: Never used  Substance and Sexual Activity   Alcohol use: No   Drug use: No   Sexual activity: Not Currently    Birth control/protection: Post-menopausal  Other Topics Concern   Not on file  Social History Narrative   Lives with her husband and son and attends yoga at the [facility] 2 days a week and speaks to her grandson nightly on the phone   Social Determinants of Health   Financial Resource Strain: Low Risk    Difficulty of Paying Living Expenses: Not hard at all  Food Insecurity: No Food Insecurity   Worried About Charity fundraiser in the Last Year: Never true   Arboriculturist in the Last Year: Never true  Transportation Needs: No Transportation Needs   Lack of Transportation (Medical): No   Lack of Transportation (Non-Medical): No  Physical Activity: Insufficiently Active   Days of Exercise per Week: 7 days   Minutes of Exercise per Session: 20 min  Stress: No Stress Concern  Present   Feeling of Stress : Not at all  Social Connections: Moderately Isolated   Frequency of Communication with Friends and Family: More than three times a week   Frequency of Social Gatherings with Friends and Family: Twice a week   Attends Religious Services: Never   Marine scientist or Organizations: No   Attends Archivist Meetings: Never   Marital Status: Married   Outpatient Encounter Medications as of 03/15/2021  Medication Sig   acetaminophen (TYLENOL) 500 MG tablet Take one tablet two times daily for 3 days, then as needed   alendronate (FOSAMAX) 70 MG tablet TAKE 1 TABLET BY MOUTH EVERY 7 DAYS. TAKE WITH A FULL GLASS OF WATER ON AN EMPTY STOMACH   amLODipine (NORVASC) 10 MG tablet TAKE 1 TABLET BY MOUTH EVERY DAY    amLODipine (NORVASC) 2.5 MG tablet Take one tablet every evening by mouth at 10:30 pm   aspirin EC 81 MG tablet Take 81 mg by mouth daily.   blood glucose meter kit and supplies KIT One touch Ultra. Use three times daily as directed. (FOR ICD E11.65)   cholecalciferol (VITAMIN D3) 25 MCG (1000 UT) tablet Take 1,000 Units by mouth daily.   glucose blood (ONETOUCH ULTRA) test strip USE TO TEST 3 TIMES A DAY   insulin lispro protamine-lispro (HUMALOG MIX 75/25) (75-25) 100 UNIT/ML SUSP injection INJECT SUBCUTANEOUSLY 10  UNITS TWICE DAILY (Patient taking differently: INJECT SUBCUTANEOUSLY 15  UNITS in AM 10 unit at [place])   Insulin Pen Needle 32G X 4 MM MISC 1 each by Does not apply route as directed.   Insulin Syringe-Needle U-100 (BD INSULIN SYRINGE U/F) 31G X 5/16" 0.5 ML MISC USE AS DIRECTED TWICE DAILY   KLOR-CON M20 20 MEQ tablet TAKE 1 TABLET BY MOUTH TWICE A DAY   losartan (COZAAR) 100 MG tablet Take 1 tablet (100 mg total) by mouth daily.   metoprolol tartrate (LOPRESSOR) 50 MG tablet TAKE 1 TABLET BY MOUTH TWICE A DAY   mupirocin ointment (BACTROBAN) 2 % Apply topically 2 (two) times daily.   OneTouch Delica Lancets 84Z MISC 1 each by Does not apply route 3 (three) times daily. Use to check glucose three times daily   RESTASIS 0.05 % ophthalmic emulsion    rosuvastatin (CRESTOR) 5 MG tablet Take 1 tablet (5 mg total) by mouth daily.   spironolactone (ALDACTONE) 25 MG tablet TAKE 1 TABLET BY MOUTH EVERY DAY   No facility-administered encounter medications on file as of 03/15/2021.   ALLERGIES: Allergies  Allergen Reactions   Benadryl [Diphenhydramine Hcl] Hypertension   Citalopram    Metformin And Related Diarrhea   Tramadol Other (See Comments)    Felt light headed and dizzy   VACCINATION STATUS: Immunization History  Administered Date(s) Administered   Fluad Quad(high Dose 65+) 06/02/2019, 05/27/2020   Influenza Split 07/17/2011   Influenza Whole 06/18/2007, 07/29/2009,  05/16/2013   Influenza, High Dose Seasonal PF 08/13/2018   Influenza,inj,Quad PF,6+ Mos 05/19/2014, 08/22/2016, 05/17/2017   Moderna Sars-Covid-2 Vaccination 10/26/2019, 11/26/2019, 07/29/2020   Pneumococcal Conjugate-13 03/24/2014   Pneumococcal Polysaccharide-23 05/20/2008   Td 06/18/2002   Tdap 07/17/2011, 12/28/2020    Diabetes She presents for her follow-up diabetic visit. She has type 2 diabetes mellitus. Onset time: She was diagnosed at approximate age of 27 years. Her disease course has been improving. There are no hypoglycemic associated symptoms. Pertinent negatives for hypoglycemia include no confusion, headaches, pallor or seizures. There are no diabetic associated symptoms. Pertinent  negatives for diabetes include no chest pain, no fatigue, no polydipsia, no polyphagia and no polyuria. There are no hypoglycemic complications. Symptoms are improving. Diabetic complications include heart disease and nephropathy. Risk factors for coronary artery disease include diabetes mellitus, dyslipidemia, hypertension, sedentary lifestyle and post-menopausal. Current diabetic treatment includes insulin injections. She is compliant with treatment most of the time. Her weight is fluctuating minimally. She is following a generally unhealthy diet. When asked about meal planning, she reported none. She has had a previous visit with a dietitian. She never participates in exercise. Her home blood glucose trend is fluctuating minimally. Her breakfast blood glucose range is generally 140-180 mg/dl. Her bedtime blood glucose range is generally 140-180 mg/dl. Her overall blood glucose range is 140-180 mg/dl. (She presents with her meter showing average blood glucose of 155-177 over the last 30 days.  Her point-of-care A1c 6.9%, overall improving.  She did not document hypoglycemia.  She is currently on Humalog 75/25 10 units twice daily AC. ) An ACE inhibitor/angiotensin II receptor blocker is being taken. She does  not see a podiatrist.Eye exam is current.  Hyperlipidemia This is a chronic problem. The current episode started more than 1 year ago. The problem is controlled. Recent lipid tests were reviewed and are normal. Exacerbating diseases include chronic renal disease and diabetes. Factors aggravating her hyperlipidemia include beta blockers and fatty foods. Pertinent negatives include no chest pain, myalgias or shortness of breath. Current antihyperlipidemic treatment includes statins. The current treatment provides moderate improvement of lipids. Compliance problems include adherence to diet and adherence to exercise.  Risk factors for coronary artery disease include diabetes mellitus, dyslipidemia, a sedentary lifestyle, hypertension and post-menopausal.  Hypertension This is a chronic problem. The current episode started more than 1 year ago. The problem has been waxing and waning since onset. The problem is uncontrolled. Pertinent negatives include no chest pain, headaches, palpitations or shortness of breath. Risk factors for coronary artery disease include diabetes mellitus, dyslipidemia, sedentary lifestyle and post-menopausal state. Past treatments include angiotensin blockers, calcium channel blockers, beta blockers and diuretics. The current treatment provides moderate improvement. There are no compliance problems.  Hypertensive end-organ damage includes kidney disease and CAD/MI. Identifiable causes of hypertension include chronic renal disease.   Review of systems  Constitutional: + Minimally fluctuating body weight,  current Body mass index is 24.49 kg/m. , no fatigue, no subjective hyperthermia, no subjective hypothermia    Objective:    BP (!) 152/66   Pulse 68   Ht 5' 1"  (1.549 m)   Wt 129 lb 9.6 oz (58.8 kg)   BMI 24.49 kg/m   Wt Readings from Last 3 Encounters:  03/15/21 129 lb 9.6 oz (58.8 kg)  03/10/21 127 lb 12.8 oz (58 kg)  03/09/21 126 lb (57.2 kg)    BP Readings from Last  3 Encounters:  03/15/21 (!) 152/66  03/10/21 (!) 171/78  01/13/21 (!) 162/74    Physical Exam- Limited  Constitutional:  Body mass index is 24.49 kg/m. , not in acute distress, mildly anxious state of mind, forgetful at times     Results for orders placed or performed in visit on 03/15/21  HgB A1c  Result Value Ref Range   Hemoglobin A1C     HbA1c POC (<> result, manual entry)     HbA1c, POC (prediabetic range)     HbA1c, POC (controlled diabetic range) 6.9 0.0 - 7.0 %   Complete Blood Count (Most recent): Lab Results  Component Value Date  WBC 8.7 12/27/2020   HGB 12.7 12/27/2020   HCT 40.5 12/27/2020   MCV 96.9 12/27/2020   PLT 235 12/27/2020   Chemistry (most recent): Lab Results  Component Value Date   NA 140 01/04/2021   K 4.7 01/04/2021   CL 99 01/04/2021   CO2 22 01/04/2021   BUN 24 01/04/2021   CREATININE 1.46 (H) 01/04/2021   Diabetic Labs (most recent): Lab Results  Component Value Date   HGBA1C 6.9 03/15/2021   HGBA1C 7.1 (A) 11/17/2020   HGBA1C 7.2 (A) 06/30/2020   Lipid Panel     Component Value Date/Time   CHOL 207 (H) 06/29/2020 0916   TRIG 50 06/29/2020 0916   HDL 115 06/29/2020 0916   CHOLHDL 1.8 06/29/2020 0916   VLDL 8 10/31/2016 0845   LDLCALC 80 06/29/2020 0916     Assessment & Plan:   1) Type 2 diabetes mellitus with stage 3 chronic kidney disease, with long-term current use of insulin    She presents with her meter showing average blood glucose of 155-177 over the last 30 days.  Her point-of-care A1c 6.9%, overall improving.  She did not document hypoglycemia.  She is currently on Humalog 75/25 10 units twice daily AC.  Her recent labs were reviewed with her.    Patient remains at a high risk for more acute and chronic complications of diabetes which include CAD, CVA, CKD, retinopathy, and neuropathy. These are all discussed in detail with the patient.  - Nutritional counseling repeated at each appointment due to patients  tendency to fall back in to old habits. - she acknowledges that there is a room for improvement in her food and drink choices. - Suggestion is made for her to avoid simple carbohydrates  from her diet including Cakes, Sweet Desserts, Ice Cream, Soda (diet and regular), Sweet Tea, Candies, Chips, Cookies, Store Bought Juices, Alcohol in Excess of  1-2 drinks a day, Artificial Sweeteners,  Coffee Creamer, and "Sugar-free" Products, Lemonade. This will help patient to have more stable blood glucose profile and potentially avoid unintended weight gain.  - I encouraged the patient to switch to  unprocessed or minimally processed complex starch and increased protein intake (animal or plant source), fruits, and vegetables.   - Patient is advised to stick to a routine mealtimes to eat 3 meals  a day and avoid unnecessary snacks ( to snack only to correct hypoglycemia).  I have approached patient to continue on  intensive monitoring of blood glucose and insulin therapy, and patient agrees.   - She continued to do fairly well on a simplified treatment with premixed insulin. Avoiding hypoglycemia is the #1 priority in her case, she does have good support with husband and adult son at home.   -Based on her presentation with near target glycemic profile, she is advised to continue Humalog 75/25 10 units twice daily AC-with breakfast and with supper only when Premeal blood glucose are above 90 mg per DL.    She is advised to stay off of glipizide.    She is advised and willing to continue to monitor blood glucose at least twice a day-breakfast, before supper, and at any other time as needed.     She is instructed to call the clinic if she has glucose readings less than 70 or greater than 200 for 3 tests in a row.    - She is warned not to inject insulin without proper monitoring of blood glucose.  -Target numbers for A1c, LDL,  HDL, Triglycerides, Waist Circumference were discussed in detail.  2) HTN:  -Her  blood pressure is controlled to target.  She is advised to continue Amlodipine 10 mg po daily, Losartan 50 mg po daily, Metoprolol 50 mg po twice daily, and Spironolactone 25 mg po daily.  May need to adjust dose at next visit if BP remains elevated.  3) Lipids:  Her most recent lipid panel from 06/29/20 shows controlled LDL of 80.  She is advised to continue Crestor 5 mg p.o. daily at bedtime.  She is also on  Lovaza 1 gm po twice daily.  Side effects and precautions discussed with patient.  4)  Weight/Diet:  Her Body mass index is 24.49 kg/m.-not a candidate for weight loss.  CDE consult in progress, exercise, and carbs information provided.   5) Chronic Care: -Patient is on ARB medications and Statins and encouraged to continue to follow up with Ophthalmology, Podiatrist at least yearly or according to recommendations, and advised to stay away from smoking. I have recommended yearly flu vaccine and pneumonia vaccination at least every 5 years; and  sleep for at least 7 hours a day.   She is advised to maintain close follow-up with her PCP:  [name], MD    I spent 34 minutes in the care of the patient today including review of labs from [facility], Lipids, Thyroid Function, Hematology (current and previous including abstractions from other facilities); face-to-face time discussing  her blood glucose readings/logs, discussing hypoglycemia and hyperglycemia episodes and symptoms, medications doses, her options of short and long term treatment based on the latest standards of care / guidelines;  discussion about incorporating lifestyle medicine;  and documenting the encounter.    Please refer to Patient Instructions for Blood Glucose Monitoring and Insulin/Medications Dosing Guide"  in media tab for additional information. Please  also refer to " Patient Self Inventory" in the Media  tab for reviewed elements of pertinent patient history.  Bali Hewitt Shorts participated in the discussions,  expressed understanding, and voiced agreement with the above plans.  All questions were answered to her satisfaction. she is encouraged to contact clinic should she have any questions or concerns prior to her return visit.   Follow up plan: Return in about 6 months (around 09/14/2021) for F/U with Pre-visit Labs, Meter, Logs, A1c here.Rayetta Pigg, FNP-BC [facility] [address] [place], [place] [address] Phone: [phone] Fax: [phone]  03/15/2021, 3:23 PM

## 2021-03-15 NOTE — Therapy (Addendum)
[place], [place], [address] Phone: [phone]   Fax:  [phone]  Occupational Therapy Evaluation  Patient Details  Name: Emily Jennings MRN: [id number] Date of Birth: October 18, 1933 Referring Provider (OT): Larena Glassman, MD   Encounter Date: 03/14/2021   OT End of Session - 03/15/21 0847     Visit Number 1    Number of Visits 12    Date for OT Re-Evaluation 04/25/21    Authorization Type [employer] medicare    Authorization Time Period $30 copay, no visit limit, no authorization needed.    Progress Note Due on Visit 10    OT Start Time 1355   Pt arrived late   OT Stop Time 1437    OT Time Calculation (min) 42 min    Activity Tolerance Patient tolerated treatment well    Behavior During Therapy WFL for tasks assessed/performed             Past Medical History:  Diagnosis Date   Allergy    Anxiety    ANXIETY DISORDER, GENERALIZED 07/15/2007   Qualifier: Diagnosis of  By: Rennie Plowman     Arthritis    Cancer [facility]) 2009   breast, carcinoma in situ left   Carcinoma in situ of breast 05/21/2008   Qualifier: Diagnosis of  By: Moshe Cipro MD, Margaret  Diagnosed in 2009, completed 5 year course of tamoxifen, no evidence of recurrence    Carotid stenosis    11/16/2005  mild plaque formation and stenosis proximal right ECA   Cataract    Complication of anesthesia    Coronary artery disease    cardiac catheterization on 03/20/2006  LAD mid 40% stenosis, left circumflex mild 40% stenosis, RCA mid-vessel 40% to 50% lesion   EF 60%   Diabetes mellitus    GERD (gastroesophageal reflux disease)    Hernia, inguinal    left   Hyperglycemia    Hypertension    Insomnia 11/16/2011   Low blood potassium    Non-insulin dependent type 2 diabetes mellitus ([place])    Osteoporosis    Shortness of breath    2D Echocardiogram 01/26/2009   EF of greater than 55%, mild MR, mild TR, normal ventricular function   Thickened endometrium  10/26/2017   Noted by gyne in 2017, missed 6 month follow up, referred in 09/2017   Ventricular tachycardia, non-sustained ([place])    developed during stress test 02/08/2006, spontaneously aborted, mild reversible apical defect    Past Surgical History:  Procedure Laterality Date   BREAST LUMPECTOMY Left 2009   Left breast 2009   CATARACT EXTRACTION W/PHACO Left 10/28/2014   Procedure: PHACO EMULSION CATARACT EXTRACTION WITH INTRAOCULAR LENS IMPLANT LEFT EYE ([place]);  Surgeon: Marylynn Pearson, MD;  Location: [facility];  Service: Ophthalmology;  Laterality: Left;   COLONOSCOPY     cyst removed from left foot      There were no vitals filed for this visit.   Subjective Assessment - 03/14/21 1359     Subjective  S: It doesn't hurt me unless I'm trying to pick something up heavy or do something I'm not supposed to do.    Pertinent History Patient is an 87 y/o female S/P right radius colles fracture that was sustained on 12/27/20 when she experienced a mechanical fall. Pt was referred patient Dr. Amedeo Kinsman for evaluation and treatment.    Patient Stated Goals To have no pain/discomfort and return to using her right arm normal.  Currently in Pain? Yes    Pain Score 1     Pain Location Wrist    Pain Orientation Left    Pain Descriptors / Indicators Sore    Pain Type Chronic pain    Pain Onset More than a month ago    Pain Frequency Constant    Aggravating Factors  does not aggravate it    Pain Relieving Factors nothing has been done.    Effect of Pain on Daily Activities no effect    Multiple Pain Sites No               [facility] OT Assessment - 03/14/21 1404       Assessment   Medical Diagnosis left radius colles fracture    Referring Provider (OT) Larena Glassman, MD    Onset Date/Surgical Date 12/27/20    Hand Dominance Right    Next MD Visit --   no follow up appointment needed right now.   Prior Therapy None for this referral      Precautions   Precautions None      Restrictions    Weight Bearing Restrictions No      Balance Screen   Has the patient fallen in the past 6 months Yes    How many times? 1    Has the patient had a decrease in activity level because of a fear of falling?  No    Is the patient reluctant to leave their home because of a fear of falling?  No      Home  Environment   Family/patient expects to be discharged to: Private residence    Living Arrangements Spouse/significant other    Available Help at Discharge --   and adult son     Prior Function   Level of Independence Independent    Vocation Retired      ADL   ADL comments Difficulty squeezing out a washcloth, lifting anything heavy (ie. can't lift a glass of water), zippers, cutting up food.      Mobility   Mobility Status Independent      Written Expression   Dominant Hand Right      Vision - History   Baseline Vision Wears glasses all the time      Cognition   Overall Cognitive Status Within Functional Limits for tasks assessed      Observation/Other Assessments   Focus on Therapeutic Outcomes (FOTO)  N/A    Other Surveys  Select    Quick DASH  56.82      Coordination   9 Hole Peg Test --   Assess next session   Box and Blocks Assess next session      Edema   Edema slight edema noted in right hand and wrist. Will measure next session to assess need for edema glove.      ROM / Strength   AROM / PROM / Strength AROM;PROM;Strength      Palpation   Palpation comment Mod fascial restrictions noted in the right forearm, wrist and hand.      AROM   Overall AROM Comments shoulder and elbow A/ROM is WNL in all ranges.    AROM Assessment Site Wrist;Forearm    Right/Left Elbow --    Right/Left Forearm Left    Left Forearm Pronation 78 Degrees    Left Forearm Supination 66 Degrees    Right/Left Wrist Left    Left Wrist Extension 32 Degrees    Left Wrist Flexion 36 Degrees  Left Wrist Radial Deviation 32 Degrees    Left Wrist Ulnar Deviation 14 Degrees      Strength    Strength Assessment Site Hand;Wrist;Forearm    Right/Left Forearm Left    Left Forearm Pronation 3/5    Left Forearm Supination 3/5    Right/Left Wrist Left    Left Wrist Flexion 3/5    Left Wrist Extension 3/5    Left Wrist Radial Deviation 3/5    Left Wrist Ulnar Deviation 3/5    Right/Left hand Left;Right    Right Hand Grip (lbs) 35    Right Hand Lateral Pinch 16 lbs    Right Hand 3 Point Pinch 12 lbs    Left Hand Grip (lbs) 0    Left Hand Lateral Pinch 8 lbs    Left Hand 3 Point Pinch 4 lbs      Left Hand AROM   L Thumb MCP 0-60 34 Degrees    L Thumb IP 0-80 42 Degrees    L Thumb Radial ADduction/ABduction 0-55 54    L Thumb Palmar ADduction/ABduction 0-45 42                 Quick Dash - 03/14/21 1408     Open a tight or new jar Unable    Do heavy household chores (wash walls, wash floors) Unable    Carry a shopping bag or briefcase Unable    Wash your back Unable    Use a knife to cut food Unable    Recreational activities in which you take some force or impact through your arm, shoulder, or hand (golf, hammering, tennis) Moderate difficulty    During the past week, to what extent has your arm, shoulder or hand problem interfered with your normal social activities with family, friends, neighbors, or groups? Not at all    During the past week, to what extent has your arm, shoulder or hand problem limited your work or other regular daily activities [place]    Arm, shoulder, or hand pain. Mild    Tingling (pins and needles) in your arm, shoulder, or hand None    Difficulty Sleeping No difficulty    DASH Score 56.82 %                        OT Education - 03/15/21 0846     Education Details hand A/ROM. Discussed location of fracture and how it can effect the entire LUE as well as typical timeframe of recovery.    Person(s) Educated Patient    Methods Explanation;Handout;Demonstration    Comprehension Verbalized understanding;Returned  demonstration              OT Short Term Goals - 03/15/21 0853       OT SHORT TERM GOAL #1   Title Patient will be educated and independent with HEP in order to faciliate her progress in therapy and return to using her LUE as her dominant extremity 75% or more of the time with all needed ADL tasks.    Time 3    Period Weeks    Status New    Target Date 04/04/21      OT SHORT TERM GOAL #2   Title Patient will increase her LUE wrist, hand, and forearm P/ROM to WNL while completing basic self care tasks with less difficulty.    Time 3    Period Weeks    Status New      OT SHORT TERM  GOAL #3   Title Patient will increase her left grip strength to 8# and pinch strength to 8# overall in order to manipulate items and holding small objects and objects of moderate weight with increase strength.    Time 3    Period Weeks    Status New               OT Long Term Goals - 03/15/21 0855       OT LONG TERM GOAL #1   Title Patient will increase her LUE wrist, hand, and forearm A/ROM to [facility] while returning to using her LUE as her dominant extremity for 100% of required daily tasks.    Time 6    Period Weeks    Status New    Target Date 04/25/21      OT LONG TERM GOAL #2   Title Patient will increase her left wrist and forearm to 4/5 in order to be able to pick up and stabilize items of medium weight withut pain and dropping them.    Time 6    Period Weeks    Status New      OT LONG TERM GOAL #3   Title Patient will increase her left hand strength to 12# and pinch strength to 9-10# where needed in order to return to folding laundry and opening jars and containers with less difficulty.    Time 6    Period Weeks    Status New      OT LONG TERM GOAL #4   Title Patient will decrease her left UE fascial restrictions to min amount or less in order to increase functional mobility needed to complete all desired self care tasks such as simple meal prep.    Time 6    Period Weeks     Status New                   Plan - 03/15/21 0848     Clinical Impression Statement A: Patient is an 87 y/o female S/P left radius colles fracture causing increased pain, fascial restrictions, and decreased strength, ROM, and coordination resulting in the inability to utilize her RUE as her dominant extremity.    OT Occupational Profile and History Problem Focused Assessment - Including review of records relating to presenting problem    Occupational performance deficits (Please refer to evaluation for details): ADL's;Leisure    Body Structure / Function / Physical Skills ADL;UE functional use;Fascial restriction;Pain;FMC;Continence;ROM;IADL;Strength;Edema    Rehab Potential Excellent    Clinical Decision Making Limited treatment options, no task modification necessary    Comorbidities Affecting Occupational Performance: None    Modification or Assistance to Complete Evaluation  No modification of tasks or assist necessary to complete eval    OT Frequency 2x / week    OT Duration 6 weeks    OT Treatment/Interventions Self-care/ADL training;Ultrasound;Patient/family education;DME and/or AE instruction;Paraffin;Passive range of motion;Cryotherapy;Electrical Stimulation;Moist Heat;Neuromuscular education;Therapeutic exercise;Manual Therapy;Therapeutic activities;Splinting    Plan P: Patient will benefit from skilled OT services to increase functional use of her RUE and return to using her RUE as her dominant extremity. Treatment Plan: Myofascial release, manual stretching, A/ROM, fine motor coordination, hand strengthening, wrist strength, shoulder stability. Modalities PRN. NEXT SESSION: assess coordination by completing 9 hole peg test and Box and Blocks. Make goals if needed. Assess/measure edema. Provide wrist A/ROM and stretches to HEP.    OT Home Exercise Plan eval: hand A/ROM    Consulted and Agree with Plan of Care  Patient             Patient will benefit from skilled  therapeutic intervention in order to improve the following deficits and impairments:   Body Structure / Function / Physical Skills: ADL, UE functional use, Fascial restriction, Pain, FMC, Continence, ROM, IADL, Strength, Edema       Visit Diagnosis: Other symptoms and signs involving the musculoskeletal system - Plan: Ot plan of care cert/re-cert  Stiffness of left wrist, not elsewhere classified - Plan: Ot plan of care cert/re-cert  Pain in left wrist - Plan: Ot plan of care cert/re-cert  Other lack of coordination - Plan: Ot plan of care cert/re-cert    Problem List Patient Active Problem List   Diagnosis Date Noted   Syncope and collapse 01/13/2021   Lip swelling 10/30/2020   Right leg pain 10/30/2020   Tubular adenoma of colon 10/26/2017   Osteoporosis 07/28/2014   Diabetic hypertension-nephrosis syndrome ([place]) 03/23/2014   Stage 3b chronic kidney disease ([place]) 03/23/2014   Type 2 diabetes mellitus with stage 3a chronic kidney disease, with long-term current use of insulin ([place]) 09/29/2013   Allergic rhinitis 01/27/2013   GERD (gastroesophageal reflux disease) 01/12/2013   CVD (cardiovascular disease) 07/17/2011   Mixed hyperlipidemia 07/15/2007   Essential hypertension 07/15/2007   Ailene Ravel, OTR/L,CBIS  [phone]   03/15/2021, 10:12 AM  [facility] [address] [place], [place], [address] Phone: [phone]   Fax:  [phone]  Name: Emily Jennings MRN: [id number] Date of Birth: 1934-07-19

## 2021-03-17 ENCOUNTER — Ambulatory Visit (HOSPITAL_COMMUNITY): Payer: Medicare Other

## 2021-03-17 ENCOUNTER — Other Ambulatory Visit: Payer: Self-pay

## 2021-03-17 ENCOUNTER — Encounter (HOSPITAL_COMMUNITY): Payer: Self-pay

## 2021-03-17 DIAGNOSIS — R29898 Other symptoms and signs involving the musculoskeletal system: Secondary | ICD-10-CM

## 2021-03-17 DIAGNOSIS — M25632 Stiffness of left wrist, not elsewhere classified: Secondary | ICD-10-CM | POA: Diagnosis not present

## 2021-03-17 DIAGNOSIS — R278 Other lack of coordination: Secondary | ICD-10-CM

## 2021-03-17 DIAGNOSIS — M25532 Pain in left wrist: Secondary | ICD-10-CM | POA: Diagnosis not present

## 2021-03-17 NOTE — Patient Instructions (Signed)
AROM Exercises   1) Wrist Flexion  Start with wrist at edge of table, palm facing up. With wrist hanging slightly off table, curl wrist upward, and back down.      2) Wrist Extension  Start with wrist at edge of table, palm facing down. With wrist slightly off the edge of the table, curl wrist up and back down.      3) Radial Deviations  Start with forearm flat against a table, wrist hanging slightly off the edge, and palm facing the wall. Bending at the wrist only, and keeping palm facing the wall, bend wrist so fist is pointing towards the floor, back up to start position, and up towards the ceiling. Return to start.        4) WRIST PRONATION  Turn your forearm towards palm face down.  Keep your elbow bent and by the side of your  Body.      5) WRIST SUPINATION  Turn your forearm towards palm face up.  Keep your elbow bent and by the side of your  Body.      *Complete exercises _____10_ times each, _____1-2__ times per day*

## 2021-03-18 NOTE — Therapy (Signed)
[facility], [place], [address] Phone: [phone]   Fax:  [phone]  Occupational Therapy Treatment  Patient Details  Name: Emily Jennings MRN: [id number] Date of Birth: 1933-12-02 Referring Provider (OT): Larena Glassman, MD   Encounter Date: 03/17/2021   OT End of Session - 03/18/21 0958     Visit Number 2    Number of Visits 12    Date for OT Re-Evaluation 04/25/21    Authorization Type [employer] medicare    Authorization Time Period $30 copay, no visit limit, no authorization needed.    Progress Note Due on Visit 10    OT Start Time 1515    OT Stop Time 1554    OT Time Calculation (min) 39 min    Activity Tolerance Patient tolerated treatment well    Behavior During Therapy WFL for tasks assessed/performed             Past Medical History:  Diagnosis Date   Allergy    Anxiety    ANXIETY DISORDER, GENERALIZED 07/15/2007   Qualifier: Diagnosis of  By: Rennie Plowman     Arthritis    Cancer [facility]) 2009   breast, carcinoma in situ left   Carcinoma in situ of breast 05/21/2008   Qualifier: Diagnosis of  By: Moshe Cipro MD, Margaret  Diagnosed in 2009, completed 5 year course of tamoxifen, no evidence of recurrence    Carotid stenosis    11/16/2005  mild plaque formation and stenosis proximal right ECA   Cataract    Complication of anesthesia    Coronary artery disease    cardiac catheterization on 03/20/2006  LAD mid 40% stenosis, left circumflex mild 40% stenosis, RCA mid-vessel 40% to 50% lesion   EF 60%   Diabetes mellitus    GERD (gastroesophageal reflux disease)    Hernia, inguinal    left   Hyperglycemia    Hypertension    Insomnia 11/16/2011   Low blood potassium    Non-insulin dependent type 2 diabetes mellitus ([place])    Osteoporosis    Shortness of breath    2D Echocardiogram 01/26/2009   EF of greater than 55%, mild MR, mild TR, normal ventricular function   Thickened endometrium 10/26/2017   Noted by  gyne in 2017, missed 6 month follow up, referred in 09/2017   Ventricular tachycardia, non-sustained ([place])    developed during stress test 02/08/2006, spontaneously aborted, mild reversible apical defect    Past Surgical History:  Procedure Laterality Date   BREAST LUMPECTOMY Left 2009   Left breast 2009   CATARACT EXTRACTION W/PHACO Left 10/28/2014   Procedure: PHACO EMULSION CATARACT EXTRACTION WITH INTRAOCULAR LENS IMPLANT LEFT EYE ([place]);  Surgeon: Marylynn Pearson, MD;  Location: [facility];  Service: Ophthalmology;  Laterality: Left;   COLONOSCOPY     cyst removed from left foot      There were no vitals filed for this visit.   Subjective Assessment - 03/17/21 1548     Subjective  S: I did my exercises this morning.    Currently in Pain? Yes    Pain Score 1     Pain Location Wrist    Pain Orientation Left    Pain Descriptors / Indicators Sore    Pain Type Chronic pain    Pain Onset More than a month ago    Pain Frequency Occasional    Aggravating Factors  nothing    Pain Relieving Factors nothing    Effect of  Pain on Daily Activities no effect                [facility] OT Assessment - 03/17/21 1521       Assessment   Medical Diagnosis left radius colles fracture      Precautions   Precautions None      Coordination   9 Hole Peg Test Right;Left    Right 9 Hole Peg Test 23.4"    Left 9 Hole Peg Test 26.8"    Box and Blocks R: 59 L: 51      Edema   Edema Right hand MCP joints: 19.0 cm left hand MCP joints: 19.4 cm                      OT Treatments/Exercises (OP) - 03/17/21 1549       Exercises   Exercises Wrist;Hand      Wrist Exercises   Other wrist exercises P/ROM flexion, extension, ulnar and radial deviation; 10X      Hand Exercises   Other Hand Exercises P/ROM, A/ROM Composite finger flexion and extension 10X      Manual Therapy   Manual Therapy Myofascial release;Edema management    Manual therapy comments Manual techniques completed prior  to exercises.    Edema Management Edema management technique completed to left hand, forearm, and fingers to reduce swelling in order to increase joint mobility.    Myofascial Release Myofascial release and manual stretching completed to left hand, wrist, and forearm to decrease fascial restrictions and increase joint mobility in a pain free zone.                    OT Education - 03/18/21 0958     Education Details wrist A/ROM added to HEP.    Person(s) Educated Patient    Methods Explanation;Demonstration;Verbal cues;Handout;Tactile cues    Comprehension Returned demonstration;Verbalized understanding              OT Short Term Goals - 03/18/21 0959       OT SHORT TERM GOAL #1   Title Patient will be educated and independent with HEP in order to faciliate her progress in therapy and return to using her LUE as her dominant extremity 75% or more of the time with all needed ADL tasks.    Time 3    Period Weeks    Status On-going    Target Date 04/04/21      OT SHORT TERM GOAL #2   Title Patient will increase her LUE wrist, hand, and forearm P/ROM to WNL while completing basic self care tasks with less difficulty.    Time 3    Period Weeks    Status On-going      OT SHORT TERM GOAL #3   Title Patient will increase her left grip strength to 8# and pinch strength to 8# overall in order to manipulate items and holding small objects and objects of moderate weight with increase strength.    Time 3    Period Weeks    Status On-going               OT Long Term Goals - 03/18/21 0959       OT LONG TERM GOAL #1   Title Patient will increase her LUE wrist, hand, and forearm A/ROM to [facility] while returning to using her LUE as her dominant extremity for 100% of required daily tasks.    Time 6  Period Weeks    Status On-going      OT LONG TERM GOAL #2   Title Patient will increase her left wrist and forearm to 4/5 in order to be able to pick up and stabilize items  of medium weight withut pain and dropping them.    Time 6    Period Weeks    Status On-going      OT LONG TERM GOAL #3   Title Patient will increase her left hand strength to 12# and pinch strength to 9-10# where needed in order to return to folding laundry and opening jars and containers with less difficulty.    Time 6    Period Weeks    Status On-going      OT LONG TERM GOAL #4   Title Patient will decrease her left UE fascial restrictions to min amount or less in order to increase functional mobility needed to complete all desired self care tasks such as simple meal prep.    Time 6    Period Weeks    Status On-going                   Plan - 03/18/21 1000     Clinical Impression Statement A: Initiated myofascial release and edema management techniques to left hand and wrist with noticeable decrease with swelling post technique. Passively patient was able to make a loose fist with fingertips touching palm although reports discomfort. Reviewed wrist A/ROM exercises added to HEP with VC for form and technique. Wrist flexion and extension continue to be limited with passive and active ROM.    Body Structure / Function / Physical Skills ADL;UE functional use;Fascial restriction;Pain;FMC;Continence;ROM;IADL;Strength;Edema    Plan P: Continue with myofascial release and edema management. Increase P/ROM of wrist and hand. Add sponges and yellow putty.    OT Home Exercise Plan eval: hand A/ROM 6/30: wrist A/ROM    Consulted and Agree with Plan of Care Patient             Patient will benefit from skilled therapeutic intervention in order to improve the following deficits and impairments:   Body Structure / Function / Physical Skills: ADL, UE functional use, Fascial restriction, Pain, FMC, Continence, ROM, IADL, Strength, Edema       Visit Diagnosis: Pain in left wrist  Stiffness of left wrist, not elsewhere classified  Other symptoms and signs involving the  musculoskeletal system  Other lack of coordination    Problem List Patient Active Problem List   Diagnosis Date Noted   Syncope and collapse 01/13/2021   Lip swelling 10/30/2020   Right leg pain 10/30/2020   Tubular adenoma of colon 10/26/2017   Osteoporosis 07/28/2014   Diabetic hypertension-nephrosis syndrome ([place]) 03/23/2014   Stage 3b chronic kidney disease ([place]) 03/23/2014   Type 2 diabetes mellitus with stage 3a chronic kidney disease, with long-term current use of insulin ([place]) 09/29/2013   Allergic rhinitis 01/27/2013   GERD (gastroesophageal reflux disease) 01/12/2013   CVD (cardiovascular disease) 07/17/2011   Mixed hyperlipidemia 07/15/2007   Essential hypertension 07/15/2007    Ailene Ravel, OTR/L,CBIS  [phone]   03/18/2021, 10:03 AM  [place] [address] [place], [place], [address] Phone: [phone]   Fax:  [phone]  Name: Emily Jennings MRN: [id number] Date of Birth: 1933/10/11

## 2021-03-22 ENCOUNTER — Ambulatory Visit (HOSPITAL_COMMUNITY): Payer: Medicare Other | Attending: Orthopedic Surgery | Admitting: Occupational Therapy

## 2021-03-22 ENCOUNTER — Encounter (HOSPITAL_COMMUNITY): Payer: Self-pay | Admitting: Occupational Therapy

## 2021-03-22 ENCOUNTER — Other Ambulatory Visit: Payer: Self-pay

## 2021-03-22 DIAGNOSIS — M25532 Pain in left wrist: Secondary | ICD-10-CM | POA: Diagnosis not present

## 2021-03-22 DIAGNOSIS — M25632 Stiffness of left wrist, not elsewhere classified: Secondary | ICD-10-CM | POA: Diagnosis not present

## 2021-03-22 DIAGNOSIS — R278 Other lack of coordination: Secondary | ICD-10-CM

## 2021-03-22 DIAGNOSIS — R29898 Other symptoms and signs involving the musculoskeletal system: Secondary | ICD-10-CM

## 2021-03-22 NOTE — Therapy (Signed)
[place], [place], [address] Phone: [phone]   Fax:  [phone]  Occupational Therapy Treatment  Patient Details  Name: Emily Jennings MRN: [id number] Date of Birth: Jan 06, 1934 Referring Provider (OT): Larena Glassman, MD   Encounter Date: 03/22/2021   OT End of Session - 03/22/21 7253     Visit Number 3    Number of Visits 12    Date for OT Re-Evaluation 04/25/21    Authorization Type [employer] medicare    Authorization Time Period $30 copay, no visit limit, no authorization needed.    Progress Note Due on Visit 10    OT Start Time 1604    OT Stop Time 1644    OT Time Calculation (min) 40 min    Activity Tolerance Patient tolerated treatment well    Behavior During Therapy WFL for tasks assessed/performed             Past Medical History:  Diagnosis Date   Allergy    Anxiety    ANXIETY DISORDER, GENERALIZED 07/15/2007   Qualifier: Diagnosis of  By: Rennie Plowman     Arthritis    Cancer [facility]) 2009   breast, carcinoma in situ left   Carcinoma in situ of breast 05/21/2008   Qualifier: Diagnosis of  By: Moshe Cipro MD, Margaret  Diagnosed in 2009, completed 5 year course of tamoxifen, no evidence of recurrence    Carotid stenosis    11/16/2005  mild plaque formation and stenosis proximal right ECA   Cataract    Complication of anesthesia    Coronary artery disease    cardiac catheterization on 03/20/2006  LAD mid 40% stenosis, left circumflex mild 40% stenosis, RCA mid-vessel 40% to 50% lesion   EF 60%   Diabetes mellitus    GERD (gastroesophageal reflux disease)    Hernia, inguinal    left   Hyperglycemia    Hypertension    Insomnia 11/16/2011   Low blood potassium    Non-insulin dependent type 2 diabetes mellitus ([place])    Osteoporosis    Shortness of breath    2D Echocardiogram 01/26/2009   EF of greater than 55%, mild MR, mild TR, normal ventricular function   Thickened endometrium 10/26/2017   Noted by  gyne in 2017, missed 6 month follow up, referred in 09/2017   Ventricular tachycardia, non-sustained ([place])    developed during stress test 02/08/2006, spontaneously aborted, mild reversible apical defect    Past Surgical History:  Procedure Laterality Date   BREAST LUMPECTOMY Left 2009   Left breast 2009   CATARACT EXTRACTION W/PHACO Left 10/28/2014   Procedure: PHACO EMULSION CATARACT EXTRACTION WITH INTRAOCULAR LENS IMPLANT LEFT EYE ([place]);  Surgeon: Marylynn Pearson, MD;  Location: [facility];  Service: Ophthalmology;  Laterality: Left;   COLONOSCOPY     cyst removed from left foot      There were no vitals filed for this visit.   Subjective Assessment - 03/22/21 1606     Subjective  S: It looks like it's still swollen.    Currently in Pain? Yes    Pain Score 1     Pain Location Wrist    Pain Orientation Left    Pain Descriptors / Indicators Sore    Pain Type Chronic pain    Pain Radiating Towards N/A    Pain Onset More than a month ago    Pain Frequency Occasional    Aggravating Factors  nothing    Pain Relieving  Factors nothing    Effect of Pain on Daily Activities no effect    Multiple Pain Sites No                [facility] OT Assessment - 03/22/21 1606       Assessment   Medical Diagnosis left radius colles fracture      Precautions   Precautions None                      OT Treatments/Exercises (OP) - 03/22/21 1607       Exercises   Exercises Wrist;Hand;Theraputty      Weighted Stretch Over Towel Roll   Wrist Flexion - Weighted Stretch 2 pounds;60 seconds    Wrist Extension - Weighted Stretch 2 pounds;60 seconds      Wrist Exercises   Wrist Flexion PROM;5 reps;AROM;10 reps    Wrist Extension PROM;5 reps;AROM;10 reps    Wrist Radial Deviation AROM;10 reps    Wrist Ulnar Deviation AROM;10 reps      Additional Wrist Exercises   Sponges 17, 17, 17      Hand Exercises   Other Hand Exercises composite digit flexion/extension, 10X      Theraputty    Theraputty - Flatten yellow-standing    Theraputty - Roll yellow      Manual Therapy   Manual Therapy Myofascial release;Edema management    Manual therapy comments Manual techniques completed prior to exercises.    Edema Management Edema management technique completed to left hand, forearm, and fingers to reduce swelling in order to increase joint mobility.    Myofascial Release Myofascial release and manual stretching completed to left hand, wrist, and forearm to decrease fascial restrictions and increase joint mobility in a pain free zone.                      OT Short Term Goals - 03/18/21 0959       OT SHORT TERM GOAL #1   Title Patient will be educated and independent with HEP in order to faciliate her progress in therapy and return to using her LUE as her dominant extremity 75% or more of the time with all needed ADL tasks.    Time 3    Period Weeks    Status On-going    Target Date 04/04/21      OT SHORT TERM GOAL #2   Title Patient will increase her LUE wrist, hand, and forearm P/ROM to WNL while completing basic self care tasks with less difficulty.    Time 3    Period Weeks    Status On-going      OT SHORT TERM GOAL #3   Title Patient will increase her left grip strength to 8# and pinch strength to 8# overall in order to manipulate items and holding small objects and objects of moderate weight with increase strength.    Time 3    Period Weeks    Status On-going               OT Long Term Goals - 03/18/21 0959       OT LONG TERM GOAL #1   Title Patient will increase her LUE wrist, hand, and forearm A/ROM to [facility] while returning to using her LUE as her dominant extremity for 100% of required daily tasks.    Time 6    Period Weeks    Status On-going      OT LONG TERM GOAL #2  Title Patient will increase her left wrist and forearm to 4/5 in order to be able to pick up and stabilize items of medium weight withut pain and dropping them.    Time 6     Period Weeks    Status On-going      OT LONG TERM GOAL #3   Title Patient will increase her left hand strength to 12# and pinch strength to 9-10# where needed in order to return to folding laundry and opening jars and containers with less difficulty.    Time 6    Period Weeks    Status On-going      OT LONG TERM GOAL #4   Title Patient will decrease her left UE fascial restrictions to min amount or less in order to increase functional mobility needed to complete all desired self care tasks such as simple meal prep.    Time 6    Period Weeks    Status On-going                   Plan - 03/22/21 1630     Clinical Impression Statement A: Pt reports her HEP is going well. Continued with myofascial release and edema management today, passive stretching completed to left wrist and digits. Pt able to tolerate index, middle, and ring fingers touching palm during stretching. Pt completing wrist A/ROM, added weighted wrist stretches, and sponges for grasp. Pt completing yellow theraputty tasks, reports mild/mod soreness with rolling. Verbal cuing for form and technique with tasks.    Body Structure / Function / Physical Skills ADL;UE functional use;Fascial restriction;Pain;FMC;Continence;ROM;IADL;Strength;Edema    Plan P: continue with manual techniques, weighted wrist stretches, trial hand gripper for grip strengthening    OT Home Exercise Plan eval: hand A/ROM 6/30: wrist A/ROM    Consulted and Agree with Plan of Care Patient             Patient will benefit from skilled therapeutic intervention in order to improve the following deficits and impairments:   Body Structure / Function / Physical Skills: ADL, UE functional use, Fascial restriction, Pain, FMC, Continence, ROM, IADL, Strength, Edema       Visit Diagnosis: Pain in left wrist  Stiffness of left wrist, not elsewhere classified  Other symptoms and signs involving the musculoskeletal system  Other lack of  coordination    Problem List Patient Active Problem List   Diagnosis Date Noted   Syncope and collapse 01/13/2021   Lip swelling 10/30/2020   Right leg pain 10/30/2020   Tubular adenoma of colon 10/26/2017   Osteoporosis 07/28/2014   Diabetic hypertension-nephrosis syndrome ([place]) 03/23/2014   Stage 3b chronic kidney disease ([place]) 03/23/2014   Type 2 diabetes mellitus with stage 3a chronic kidney disease, with long-term current use of insulin ([place]) 09/29/2013   Allergic rhinitis 01/27/2013   GERD (gastroesophageal reflux disease) 01/12/2013   CVD (cardiovascular disease) 07/17/2011   Mixed hyperlipidemia 07/15/2007   Essential hypertension 07/15/2007    Guadelupe Sabin, OTR/L  [phone] 03/22/2021, 4:45 PM  [place] [address] [place], [place], [address] Phone: [phone]   Fax:  [phone]  Name: Emily Jennings MRN: [id number] Date of Birth: [birth year]

## 2021-03-24 ENCOUNTER — Ambulatory Visit (HOSPITAL_COMMUNITY): Payer: Medicare Other

## 2021-03-24 ENCOUNTER — Encounter (HOSPITAL_COMMUNITY): Payer: Self-pay

## 2021-03-24 ENCOUNTER — Other Ambulatory Visit: Payer: Self-pay

## 2021-03-24 DIAGNOSIS — M25632 Stiffness of left wrist, not elsewhere classified: Secondary | ICD-10-CM | POA: Diagnosis not present

## 2021-03-24 DIAGNOSIS — R278 Other lack of coordination: Secondary | ICD-10-CM | POA: Diagnosis not present

## 2021-03-24 DIAGNOSIS — M25532 Pain in left wrist: Secondary | ICD-10-CM | POA: Diagnosis not present

## 2021-03-24 DIAGNOSIS — R29898 Other symptoms and signs involving the musculoskeletal system: Secondary | ICD-10-CM

## 2021-03-24 NOTE — Therapy (Signed)
[place], [place], [address] Phone: [phone]   Fax:  [phone]  Occupational Therapy Treatment  Patient Details  Name: Emily Jennings MRN: [id number] Date of Birth: [birth year] Referring Provider (OT): Larena Glassman, MD   Encounter Date: 03/24/2021   OT End of Session - 03/24/21 1022     Visit Number 4    Number of Visits 12    Date for OT Re-Evaluation 04/25/21    Authorization Type [employer] medicare    Authorization Time Period $30 copay, no visit limit, no authorization needed.    Progress Note Due on Visit 10    OT Start Time [phone]   pt arrived late   OT Stop Time 1028    OT Time Calculation (min) 33 min    Activity Tolerance Patient tolerated treatment well    Behavior During Therapy WFL for tasks assessed/performed             Past Medical History:  Diagnosis Date   Allergy    Anxiety    ANXIETY DISORDER, GENERALIZED 07/15/2007   Qualifier: Diagnosis of  By: Rennie Plowman     Arthritis    Cancer [facility]) 2009   breast, carcinoma in situ left   Carcinoma in situ of breast 05/21/2008   Qualifier: Diagnosis of  By: Moshe Cipro MD, Margaret  Diagnosed in 2009, completed 5 year course of tamoxifen, no evidence of recurrence    Carotid stenosis    11/16/2005  mild plaque formation and stenosis proximal right ECA   Cataract    Complication of anesthesia    Coronary artery disease    cardiac catheterization on 03/20/2006  LAD mid 40% stenosis, left circumflex mild 40% stenosis, RCA mid-vessel 40% to 50% lesion   EF 60%   Diabetes mellitus    GERD (gastroesophageal reflux disease)    Hernia, inguinal    left   Hyperglycemia    Hypertension    Insomnia 11/16/2011   Low blood potassium    Non-insulin dependent type 2 diabetes mellitus ([place])    Osteoporosis    Shortness of breath    2D Echocardiogram 01/26/2009   EF of greater than 55%, mild MR, mild TR, normal ventricular function   Thickened endometrium  10/26/2017   Noted by gyne in 2017, missed 6 month follow up, referred in 09/2017   Ventricular tachycardia, non-sustained ([place])    developed during stress test 02/08/2006, spontaneously aborted, mild reversible apical defect    Past Surgical History:  Procedure Laterality Date   BREAST LUMPECTOMY Left 2009   Left breast 2009   CATARACT EXTRACTION W/PHACO Left 10/28/2014   Procedure: PHACO EMULSION CATARACT EXTRACTION WITH INTRAOCULAR LENS IMPLANT LEFT EYE ([place]);  Surgeon: Marylynn Pearson, MD;  Location: [facility];  Service: Ophthalmology;  Laterality: Left;   COLONOSCOPY     cyst removed from left foot      There were no vitals filed for this visit.   Subjective Assessment - 03/24/21 0955     Subjective  S: My fingers are just sore today.    Currently in Pain? Yes    Pain Score 3     Pain Location Finger (Comment which one)   2nd-5th digit   Pain Orientation Left    Pain Descriptors / Indicators Sore    Pain Type Chronic pain    Pain Onset More than a month ago    Pain Frequency Occasional    Aggravating Factors  nothing  Pain Relieving Factors nothing    Effect of Pain on Daily Activities no effect    Multiple Pain Sites No                [facility] OT Assessment - 03/24/21 1002       Assessment   Medical Diagnosis left radius colles fracture      Precautions   Precautions None                      OT Treatments/Exercises (OP) - 03/24/21 0957       Exercises   Exercises Wrist;Hand;Theraputty      Weighted Stretch Over Towel Roll   Wrist Flexion - Weighted Stretch 2 pounds;60 seconds    Wrist Extension - Weighted Stretch 2 pounds;60 seconds      Additional Wrist Exercises   Sponges 12,14    Hand Gripper with Large Beads all beads with gripper set at 11# horizontal    Hand Gripper with Medium Beads all beads with gripper set at 11# horizontal    Hand Gripper with Small Beads all beads with gripper set at 7# horizontal      Hand Exercises   Other Hand  Exercises composite digit flexion/extension, 10X, held for 3" each position.    Other Hand Exercises Utilized red resistive clothespin to pick up 25 high resistance sponges using 3 point pinch      Theraputty   Theraputty Hand- Locate Pegs 8/8 beads located using left hand - yellow putty                      OT Short Term Goals - 03/18/21 0959       OT SHORT TERM GOAL #1   Title Patient will be educated and independent with HEP in order to faciliate her progress in therapy and return to using her LUE as her dominant extremity 75% or more of the time with all needed ADL tasks.    Time 3    Period Weeks    Status On-going    Target Date 04/04/21      OT SHORT TERM GOAL #2   Title Patient will increase her LUE wrist, hand, and forearm P/ROM to WNL while completing basic self care tasks with less difficulty.    Time 3    Period Weeks    Status On-going      OT SHORT TERM GOAL #3   Title Patient will increase her left grip strength to 8# and pinch strength to 8# overall in order to manipulate items and holding small objects and objects of moderate weight with increase strength.    Time 3    Period Weeks    Status On-going               OT Long Term Goals - 03/18/21 0959       OT LONG TERM GOAL #1   Title Patient will increase her LUE wrist, hand, and forearm A/ROM to [facility] while returning to using her LUE as her dominant extremity for 100% of required daily tasks.    Time 6    Period Weeks    Status On-going      OT LONG TERM GOAL #2   Title Patient will increase her left wrist and forearm to 4/5 in order to be able to pick up and stabilize items of medium weight withut pain and dropping them.    Time 6    Period Weeks  Status On-going      OT LONG TERM GOAL #3   Title Patient will increase her left hand strength to 12# and pinch strength to 9-10# where needed in order to return to folding laundry and opening jars and containers with less difficulty.     Time 6    Period Weeks    Status On-going      OT LONG TERM GOAL #4   Title Patient will decrease her left UE fascial restrictions to min amount or less in order to increase functional mobility needed to complete all desired self care tasks such as simple meal prep.    Time 6    Period Weeks    Status On-going                   Plan - 03/24/21 1043     Clinical Impression Statement A: Due to time contraint myofascial release was not completed this session. Continued with weighted wrist stretch to increase flexion and extension. Focused on hand strength with handgripper and pinch strength with resistive clothespin. Provided VC for form and technique.    Body Structure / Function / Physical Skills ADL;UE functional use;Fascial restriction;Pain;FMC;Continence;ROM;IADL;Strength;Edema    Plan P: continue with manual techniques. May want to try paraffin and heat prior to stretching with hand in a fist. Continue with handgripper for hand strengthening. Provide putty HEP.    Consulted and Agree with Plan of Care Patient             Patient will benefit from skilled therapeutic intervention in order to improve the following deficits and impairments:   Body Structure / Function / Physical Skills: ADL, UE functional use, Fascial restriction, Pain, FMC, Continence, ROM, IADL, Strength, Edema       Visit Diagnosis: Pain in left wrist  Stiffness of left wrist, not elsewhere classified  Other lack of coordination  Other symptoms and signs involving the musculoskeletal system    Problem List Patient Active Problem List   Diagnosis Date Noted   Syncope and collapse 01/13/2021   Lip swelling 10/30/2020   Right leg pain 10/30/2020   Tubular adenoma of colon 10/26/2017   Osteoporosis 07/28/2014   Diabetic hypertension-nephrosis syndrome ([place]) 03/23/2014   Stage 3b chronic kidney disease ([place]) 03/23/2014   Type 2 diabetes mellitus with stage 3a chronic kidney disease, with  long-term current use of insulin ([place]) 09/29/2013   Allergic rhinitis 01/27/2013   GERD (gastroesophageal reflux disease) 01/12/2013   CVD (cardiovascular disease) 07/17/2011   Mixed hyperlipidemia 07/15/2007   Essential hypertension 07/15/2007   Ailene Ravel, OTR/L,CBIS  [phone]   03/24/2021, 10:48 AM  [place], [place], [address] Phone: [phone]   Fax:  [phone]  Name: Emily Jennings MRN: [id number] Date of Birth: 10-08-1933

## 2021-03-29 ENCOUNTER — Ambulatory Visit (HOSPITAL_COMMUNITY): Payer: Medicare Other | Admitting: Occupational Therapy

## 2021-03-29 ENCOUNTER — Encounter (HOSPITAL_COMMUNITY): Payer: Self-pay | Admitting: Occupational Therapy

## 2021-03-29 ENCOUNTER — Other Ambulatory Visit: Payer: Self-pay

## 2021-03-29 DIAGNOSIS — R278 Other lack of coordination: Secondary | ICD-10-CM | POA: Diagnosis not present

## 2021-03-29 DIAGNOSIS — M25632 Stiffness of left wrist, not elsewhere classified: Secondary | ICD-10-CM | POA: Diagnosis not present

## 2021-03-29 DIAGNOSIS — M25532 Pain in left wrist: Secondary | ICD-10-CM | POA: Diagnosis not present

## 2021-03-29 DIAGNOSIS — R29898 Other symptoms and signs involving the musculoskeletal system: Secondary | ICD-10-CM | POA: Diagnosis not present

## 2021-03-29 NOTE — Therapy (Signed)
[place], [place], [address] Phone: [phone]   Fax:  [phone]  Occupational Therapy Treatment  Patient Details  Name: Emily Jennings MRN: [id number] Date of Birth: [birth year] Referring Provider (OT): Larena Glassman, MD   Encounter Date: 03/29/2021   OT End of Session - 03/29/21 1553     Visit Number 5    Number of Visits 12    Date for OT Re-Evaluation 04/25/21    Authorization Type [employer] medicare    Authorization Time Period $30 copay, no visit limit, no authorization needed.    Progress Note Due on Visit 10    OT Start Time 1513    OT Stop Time 1557    OT Time Calculation (min) 44 min    Activity Tolerance Patient tolerated treatment well    Behavior During Therapy WFL for tasks assessed/performed             Past Medical History:  Diagnosis Date   Allergy    Anxiety    ANXIETY DISORDER, GENERALIZED 07/15/2007   Qualifier: Diagnosis of  By: Rennie Plowman     Arthritis    Cancer [facility]) 2009   breast, carcinoma in situ left   Carcinoma in situ of breast 05/21/2008   Qualifier: Diagnosis of  By: Moshe Cipro MD, Margaret  Diagnosed in 2009, completed 5 year course of tamoxifen, no evidence of recurrence    Carotid stenosis    11/16/2005  mild plaque formation and stenosis proximal right ECA   Cataract    Complication of anesthesia    Coronary artery disease    cardiac catheterization on 03/20/2006  LAD mid 40% stenosis, left circumflex mild 40% stenosis, RCA mid-vessel 40% to 50% lesion   EF 60%   Diabetes mellitus    GERD (gastroesophageal reflux disease)    Hernia, inguinal    left   Hyperglycemia    Hypertension    Insomnia 11/16/2011   Low blood potassium    Non-insulin dependent type 2 diabetes mellitus ([place])    Osteoporosis    Shortness of breath    2D Echocardiogram 01/26/2009   EF of greater than 55%, mild MR, mild TR, normal ventricular function   Thickened endometrium 10/26/2017   Noted by  gyne in 2017, missed 6 month follow up, referred in 09/2017   Ventricular tachycardia, non-sustained ([place])    developed during stress test 02/08/2006, spontaneously aborted, mild reversible apical defect    Past Surgical History:  Procedure Laterality Date   BREAST LUMPECTOMY Left 2009   Left breast 2009   CATARACT EXTRACTION W/PHACO Left 10/28/2014   Procedure: PHACO EMULSION CATARACT EXTRACTION WITH INTRAOCULAR LENS IMPLANT LEFT EYE ([place]);  Surgeon: Marylynn Pearson, MD;  Location: [place];  Service: Ophthalmology;  Laterality: Left;   COLONOSCOPY     cyst removed from left foot      There were no vitals filed for this visit.   Subjective Assessment - 03/29/21 1514     Subjective  S: I think I'm squeezing better now.    Currently in Pain? No/denies                [facility] OT Assessment - 03/29/21 1514       Assessment   Medical Diagnosis left radius colles fracture      Precautions   Precautions None                      OT Treatments/Exercises (  OP) - 03/29/21 1514       Exercises   Exercises Wrist;Hand;Theraputty      Weighted Stretch Over Towel Roll   Wrist Flexion - Weighted Stretch 2 pounds;60 seconds    Wrist Extension - Weighted Stretch 2 pounds;60 seconds      Wrist Exercises   Wrist Flexion PROM;5 reps;AROM;10 reps    Wrist Extension PROM;5 reps;AROM;10 reps    Wrist Radial Deviation AROM;10 reps    Wrist Ulnar Deviation AROM;10 reps    Other wrist exercises P/ROM forearm supination/pronation, 5X      Additional Wrist Exercises   Sponges 17, 16, 14    Hand Gripper with Large Beads all beads with gripper set at 20# vertical    Hand Gripper with Medium Beads all beads with gripper set at 15# vertical    Hand Gripper with Small Beads all beads with gripper set at 15# horizontal      Theraputty   Theraputty - Flatten yellow    Theraputty - Roll yellow    Theraputty - Grip yellow    Theraputty - Pinch yellow-3 point & lateral      Manual  Therapy   Manual Therapy Myofascial release;Edema management    Manual therapy comments Manual techniques completed prior to exercises.    Edema Management Edema management technique completed to left hand, forearm, and fingers to reduce swelling in order to increase joint mobility.    Myofascial Release Myofascial release and manual stretching completed to left hand, wrist, and forearm to decrease fascial restrictions and increase joint mobility in a pain free zone.                    OT Education - 03/29/21 1546     Education Details yellow theraputty grip and pinch strengthening    Person(s) Educated Patient    Methods Explanation;Demonstration;Verbal cues;Handout;Tactile cues    Comprehension Returned demonstration;Verbalized understanding              OT Short Term Goals - 03/18/21 0959       OT SHORT TERM GOAL #1   Title Patient will be educated and independent with HEP in order to faciliate her progress in therapy and return to using her LUE as her dominant extremity 75% or more of the time with all needed ADL tasks.    Time 3    Period Weeks    Status On-going    Target Date 04/04/21      OT SHORT TERM GOAL #2   Title Patient will increase her LUE wrist, hand, and forearm P/ROM to WNL while completing basic self care tasks with less difficulty.    Time 3    Period Weeks    Status On-going      OT SHORT TERM GOAL #3   Title Patient will increase her left grip strength to 8# and pinch strength to 8# overall in order to manipulate items and holding small objects and objects of moderate weight with increase strength.    Time 3    Period Weeks    Status On-going               OT Long Term Goals - 03/18/21 0959       OT LONG TERM GOAL #1   Title Patient will increase her LUE wrist, hand, and forearm A/ROM to [facility] while returning to using her LUE as her dominant extremity for 100% of required daily tasks.    Time 6  Period Weeks    Status On-going       OT LONG TERM GOAL #2   Title Patient will increase her left wrist and forearm to 4/5 in order to be able to pick up and stabilize items of medium weight withut pain and dropping them.    Time 6    Period Weeks    Status On-going      OT LONG TERM GOAL #3   Title Patient will increase her left hand strength to 12# and pinch strength to 9-10# where needed in order to return to folding laundry and opening jars and containers with less difficulty.    Time 6    Period Weeks    Status On-going      OT LONG TERM GOAL #4   Title Patient will decrease her left UE fascial restrictions to min amount or less in order to increase functional mobility needed to complete all desired self care tasks such as simple meal prep.    Time 6    Period Weeks    Status On-going                   Plan - 03/29/21 1544     Clinical Impression Statement A: Resumed myofascial release and manual techniques this session, continued with passive stretching to left wrist and digits. Pt completing A/ROM, grasp activity, and hand gripper tasks. Pt able to increase hand gripper resistance to 20# with large beads and 15# with medium and small beads. Reviewed new HEP for grip and pinch strengthening. Verbal cuing for form and technique.    Body Structure / Function / Physical Skills ADL;UE functional use;Fascial restriction;Pain;FMC;Continence;ROM;IADL;Strength;Edema    Plan P: Continue with passive wrist stretching and A/ROM increasing repetitions to 15, hand strengthening, add pinch task. Follow up on HEP    OT Home Exercise Plan eval: hand A/ROM 6/30: wrist A/ROM; 7/12: yellow theraputty for grip and pinch strengthening    Consulted and Agree with Plan of Care Patient             Patient will benefit from skilled therapeutic intervention in order to improve the following deficits and impairments:   Body Structure / Function / Physical Skills: ADL, UE functional use, Fascial restriction, Pain, FMC,  Continence, ROM, IADL, Strength, Edema       Visit Diagnosis: Pain in left wrist  Stiffness of left wrist, not elsewhere classified  Other lack of coordination  Other symptoms and signs involving the musculoskeletal system    Problem List Patient Active Problem List   Diagnosis Date Noted   Syncope and collapse 01/13/2021   Lip swelling 10/30/2020   Right leg pain 10/30/2020   Tubular adenoma of colon 10/26/2017   Osteoporosis 07/28/2014   Diabetic hypertension-nephrosis syndrome ([place]) 03/23/2014   Stage 3b chronic kidney disease ([place]) 03/23/2014   Type 2 diabetes mellitus with stage 3a chronic kidney disease, with long-term current use of insulin ([place]) 09/29/2013   Allergic rhinitis 01/27/2013   GERD (gastroesophageal reflux disease) 01/12/2013   CVD (cardiovascular disease) 07/17/2011   Mixed hyperlipidemia 07/15/2007   Essential hypertension 07/15/2007    Guadelupe Sabin, OTR/L  [phone] 03/29/2021, 4:25 PM  [facility] [address] [place], [place], [address] Phone: [phone]   Fax:  [phone]  Name: Emily Jennings MRN: [id number] Date of Birth: 10-May-1934

## 2021-03-29 NOTE — Patient Instructions (Signed)
  Home Exercises Program Theraputty Exercises  Do the following exercises 2 times a day using your affected hand.  1. Roll putty into a ball.  2. Make into a pancake.  3. Roll putty into a roll.  4. Pinch along log with first finger and thumb.   5. Make into a ball.  6. Roll it back into a log.   7. Pinch using thumb and side of first finger.  8. Roll into a ball, then flatten into a pancake.  9. Using your fingers, make putty into a mountain.  10. Roll putty back into a ball and squeeze gently for 2-3 minutes.    

## 2021-03-30 ENCOUNTER — Other Ambulatory Visit: Payer: Self-pay | Admitting: Family Medicine

## 2021-04-01 ENCOUNTER — Ambulatory Visit (HOSPITAL_COMMUNITY): Payer: Medicare Other

## 2021-04-05 ENCOUNTER — Other Ambulatory Visit: Payer: Self-pay

## 2021-04-05 ENCOUNTER — Ambulatory Visit (HOSPITAL_COMMUNITY): Payer: Medicare Other | Admitting: Occupational Therapy

## 2021-04-05 ENCOUNTER — Encounter (HOSPITAL_COMMUNITY): Payer: Self-pay | Admitting: Occupational Therapy

## 2021-04-05 DIAGNOSIS — R29898 Other symptoms and signs involving the musculoskeletal system: Secondary | ICD-10-CM | POA: Diagnosis not present

## 2021-04-05 DIAGNOSIS — R278 Other lack of coordination: Secondary | ICD-10-CM

## 2021-04-05 DIAGNOSIS — M25632 Stiffness of left wrist, not elsewhere classified: Secondary | ICD-10-CM | POA: Diagnosis not present

## 2021-04-05 DIAGNOSIS — M25532 Pain in left wrist: Secondary | ICD-10-CM | POA: Diagnosis not present

## 2021-04-05 NOTE — Therapy (Signed)
[place], [place], [address] Phone: [phone]   Fax:  [phone]  Occupational Therapy Treatment  Patient Details  Name: Emily Jennings MRN: [id number] Date of Birth: 1934-08-25 Referring Provider (OT): Larena Glassman, MD   Encounter Date: 04/05/2021   OT End of Session - 04/05/21 1552     Visit Number 6    Number of Visits 12    Date for OT Re-Evaluation 04/25/21    Authorization Type [employer] medicare    Authorization Time Period $30 copay, no visit limit, no authorization needed.    Progress Note Due on Visit 10    OT Start Time 1517    OT Stop Time 1557    OT Time Calculation (min) 40 min    Activity Tolerance Patient tolerated treatment well    Behavior During Therapy WFL for tasks assessed/performed             Past Medical History:  Diagnosis Date   Allergy    Anxiety    ANXIETY DISORDER, GENERALIZED 07/15/2007   Qualifier: Diagnosis of  By: Rennie Plowman     Arthritis    Cancer [facility]) 2009   breast, carcinoma in situ left   Carcinoma in situ of breast 05/21/2008   Qualifier: Diagnosis of  By: Moshe Cipro MD, Margaret  Diagnosed in 2009, completed 5 year course of tamoxifen, no evidence of recurrence    Carotid stenosis    11/16/2005  mild plaque formation and stenosis proximal right ECA   Cataract    Complication of anesthesia    Coronary artery disease    cardiac catheterization on 03/20/2006  LAD mid 40% stenosis, left circumflex mild 40% stenosis, RCA mid-vessel 40% to 50% lesion   EF 60%   Diabetes mellitus    GERD (gastroesophageal reflux disease)    Hernia, inguinal    left   Hyperglycemia    Hypertension    Insomnia 11/16/2011   Low blood potassium    Non-insulin dependent type 2 diabetes mellitus ([place])    Osteoporosis    Shortness of breath    2D Echocardiogram 01/26/2009   EF of greater than 55%, mild MR, mild TR, normal ventricular function   Thickened endometrium 10/26/2017   Noted by  gyne in 2017, missed 6 month follow up, referred in 09/2017   Ventricular tachycardia, non-sustained ([place])    developed during stress test 02/08/2006, spontaneously aborted, mild reversible apical defect    Past Surgical History:  Procedure Laterality Date   BREAST LUMPECTOMY Left 2009   Left breast 2009   CATARACT EXTRACTION W/PHACO Left 10/28/2014   Procedure: PHACO EMULSION CATARACT EXTRACTION WITH INTRAOCULAR LENS IMPLANT LEFT EYE ([place]);  Surgeon: Marylynn Pearson, MD;  Location: [place];  Service: Ophthalmology;  Laterality: Left;   COLONOSCOPY     cyst removed from left foot      There were no vitals filed for this visit.   Subjective Assessment - 04/05/21 1517     Subjective  S: I lifted something and my wrist was hurting, but it's fine now.    Currently in Pain? No/denies                [facility] OT Assessment - 04/05/21 1517       Assessment   Medical Diagnosis left radius colles fracture      Precautions   Precautions None  OT Treatments/Exercises (OP) - 04/05/21 1519       Exercises   Exercises Wrist;Hand;Theraputty      Wrist Exercises   Wrist Flexion PROM;5 reps;AROM;10 reps    Wrist Extension PROM;5 reps;AROM;10 reps    Wrist Radial Deviation AROM;10 reps    Wrist Ulnar Deviation AROM;10 reps      Additional Wrist Exercises   Sponges 18, 16, 18    Hand Gripper with Large Beads all beads with gripper set at 25# horizontal    Hand Gripper with Medium Beads all beads with gripper set at 25# vertical      Hand Exercises   Other Hand Exercises Pt using yellow clothespin and 3 point pinch to grasp and stack high resistance sponges. Pt completing 3 towers of 5 sponges.      Manual Therapy   Manual Therapy Myofascial release;Edema management    Manual therapy comments Manual techniques completed prior to exercises.    Edema Management Edema management technique completed to left hand, forearm, and fingers to reduce swelling in  order to increase joint mobility.    Myofascial Release Myofascial release and manual stretching completed to left hand, wrist, and forearm to decrease fascial restrictions and increase joint mobility in a pain free zone.                      OT Short Term Goals - 03/18/21 0959       OT SHORT TERM GOAL #1   Title Patient will be educated and independent with HEP in order to faciliate her progress in therapy and return to using her LUE as her dominant extremity 75% or more of the time with all needed ADL tasks.    Time 3    Period Weeks    Status On-going    Target Date 04/04/21      OT SHORT TERM GOAL #2   Title Patient will increase her LUE wrist, hand, and forearm P/ROM to WNL while completing basic self care tasks with less difficulty.    Time 3    Period Weeks    Status On-going      OT SHORT TERM GOAL #3   Title Patient will increase her left grip strength to 8# and pinch strength to 8# overall in order to manipulate items and holding small objects and objects of moderate weight with increase strength.    Time 3    Period Weeks    Status On-going               OT Long Term Goals - 03/18/21 0959       OT LONG TERM GOAL #1   Title Patient will increase her LUE wrist, hand, and forearm A/ROM to [facility] while returning to using her LUE as her dominant extremity for 100% of required daily tasks.    Time 6    Period Weeks    Status On-going      OT LONG TERM GOAL #2   Title Patient will increase her left wrist and forearm to 4/5 in order to be able to pick up and stabilize items of medium weight withut pain and dropping them.    Time 6    Period Weeks    Status On-going      OT LONG TERM GOAL #3   Title Patient will increase her left hand strength to 12# and pinch strength to 9-10# where needed in order to return to folding laundry and opening jars and  containers with less difficulty.    Time 6    Period Weeks    Status On-going      OT LONG TERM GOAL #4    Title Patient will decrease her left UE fascial restrictions to min amount or less in order to increase functional mobility needed to complete all desired self care tasks such as simple meal prep.    Time 6    Period Weeks    Status On-going                   Plan - 04/05/21 1537     Clinical Impression Statement A: Continued with manual techniques with pt reporting decreased tight feeling when completed. Pt able to make a fist today, continued working on grasp and grip strengthening increasing gripper resistance. Also continued with wrist ROM with passive stretching and A/ROM. Added pinch task using resistive clothespins and high resistance sponges, trialed red clothespin however downgraded to yellow due to difficulty. Verbal cuing for form and technique.    Body Structure / Function / Physical Skills ADL;UE functional use;Fascial restriction;Pain;FMC;Continence;ROM;IADL;Strength;Edema    Plan P: Continue with pinch task, grip strengthening, wrist ROM increasing repetitions to 15    OT Home Exercise Plan eval: hand A/ROM 6/30: wrist A/ROM; 7/12: yellow theraputty for grip and pinch strengthening    Consulted and Agree with Plan of Care Patient             Patient will benefit from skilled therapeutic intervention in order to improve the following deficits and impairments:   Body Structure / Function / Physical Skills: ADL, UE functional use, Fascial restriction, Pain, FMC, Continence, ROM, IADL, Strength, Edema       Visit Diagnosis: Pain in left wrist  Stiffness of left wrist, not elsewhere classified  Other lack of coordination  Other symptoms and signs involving the musculoskeletal system    Problem List Patient Active Problem List   Diagnosis Date Noted   Syncope and collapse 01/13/2021   Lip swelling 10/30/2020   Right leg pain 10/30/2020   Tubular adenoma of colon 10/26/2017   Osteoporosis 07/28/2014   Diabetic hypertension-nephrosis syndrome ([place])  03/23/2014   Stage 3b chronic kidney disease ([place]) 03/23/2014   Type 2 diabetes mellitus with stage 3a chronic kidney disease, with long-term current use of insulin ([place]) 09/29/2013   Allergic rhinitis 01/27/2013   GERD (gastroesophageal reflux disease) 01/12/2013   CVD (cardiovascular disease) 07/17/2011   Mixed hyperlipidemia 07/15/2007   Essential hypertension 07/15/2007    Guadelupe Sabin, OTR/L  [phone] 04/05/2021, 3:58 PM  [facility] [address] [place], [place], [address] Phone: [phone]   Fax:  [phone]  Name: Emily Jennings MRN: [id number] Date of Birth: 11/16/1933

## 2021-04-07 ENCOUNTER — Ambulatory Visit (HOSPITAL_COMMUNITY): Payer: Medicare Other | Admitting: Occupational Therapy

## 2021-04-07 ENCOUNTER — Other Ambulatory Visit: Payer: Self-pay

## 2021-04-07 ENCOUNTER — Encounter (HOSPITAL_COMMUNITY): Payer: Self-pay | Admitting: Occupational Therapy

## 2021-04-07 DIAGNOSIS — M25632 Stiffness of left wrist, not elsewhere classified: Secondary | ICD-10-CM | POA: Diagnosis not present

## 2021-04-07 DIAGNOSIS — M25532 Pain in left wrist: Secondary | ICD-10-CM | POA: Diagnosis not present

## 2021-04-07 DIAGNOSIS — R278 Other lack of coordination: Secondary | ICD-10-CM

## 2021-04-07 DIAGNOSIS — R29898 Other symptoms and signs involving the musculoskeletal system: Secondary | ICD-10-CM

## 2021-04-07 NOTE — Therapy (Signed)
[place], [place], [address] Phone: [phone]   Fax:  [phone]  Occupational Therapy Treatment  Patient Details  Name: Emily Jennings MRN: [id number] Date of Birth: 04-Oct-1933 Referring Provider (OT): Larena Glassman, MD   Encounter Date: 04/07/2021   OT End of Session - 04/07/21 1556     Visit Number 7    Number of Visits 12    Date for OT Re-Evaluation 04/25/21    Authorization Type [employer] medicare    Authorization Time Period $30 copay, no visit limit, no authorization needed.    Progress Note Due on Visit 10    OT Start Time 1513    OT Stop Time 1554    OT Time Calculation (min) 41 min    Activity Tolerance Patient tolerated treatment well    Behavior During Therapy WFL for tasks assessed/performed             Past Medical History:  Diagnosis Date   Allergy    Anxiety    ANXIETY DISORDER, GENERALIZED 07/15/2007   Qualifier: Diagnosis of  By: Rennie Plowman     Arthritis    Cancer [facility]) 2009   breast, carcinoma in situ left   Carcinoma in situ of breast 05/21/2008   Qualifier: Diagnosis of  By: Moshe Cipro MD, Margaret  Diagnosed in 2009, completed 5 year course of tamoxifen, no evidence of recurrence    Carotid stenosis    11/16/2005  mild plaque formation and stenosis proximal right ECA   Cataract    Complication of anesthesia    Coronary artery disease    cardiac catheterization on 03/20/2006  LAD mid 40% stenosis, left circumflex mild 40% stenosis, RCA mid-vessel 40% to 50% lesion   EF 60%   Diabetes mellitus    GERD (gastroesophageal reflux disease)    Hernia, inguinal    left   Hyperglycemia    Hypertension    Insomnia 11/16/2011   Low blood potassium    Non-insulin dependent type 2 diabetes mellitus ([place])    Osteoporosis    Shortness of breath    2D Echocardiogram 01/26/2009   EF of greater than 55%, mild MR, mild TR, normal ventricular function   Thickened endometrium 10/26/2017   Noted by  gyne in 2017, missed 6 month follow up, referred in 09/2017   Ventricular tachycardia, non-sustained ([place])    developed during stress test 02/08/2006, spontaneously aborted, mild reversible apical defect    Past Surgical History:  Procedure Laterality Date   BREAST LUMPECTOMY Left 2009   Left breast 2009   CATARACT EXTRACTION W/PHACO Left 10/28/2014   Procedure: PHACO EMULSION CATARACT EXTRACTION WITH INTRAOCULAR LENS IMPLANT LEFT EYE ([place]);  Surgeon: Marylynn Pearson, MD;  Location: [place];  Service: Ophthalmology;  Laterality: Left;   COLONOSCOPY     cyst removed from left foot      There were no vitals filed for this visit.   Subjective Assessment - 04/07/21 1514     Subjective  S: It could be doing better.    Currently in Pain? No/denies                [facility] OT Assessment - 04/07/21 1514       Assessment   Medical Diagnosis left radius colles fracture      Precautions   Precautions None                      OT Treatments/Exercises (OP) -  04/07/21 1515       Exercises   Exercises Wrist;Hand;Theraputty      Wrist Exercises   Wrist Flexion PROM;5 reps;AROM;15 reps    Wrist Extension PROM;5 reps;AROM;15 reps    Wrist Radial Deviation AROM;15 reps    Wrist Ulnar Deviation AROM;15 reps    Other wrist exercises P/ROM forearm supination/pronation, 5X      Additional Wrist Exercises   Hand Gripper with Large Beads all beads with gripper set at 22# horizontal    Hand Gripper with Medium Beads all beads with gripper set at 22# horizontal    Hand Gripper with Small Beads all beads with gripper set at 20# horizontal      Hand Exercises   Other Hand Exercises Pt using red clothespin and lateral pinch to grasp and move 25 high resistance sponges to a bucket. Pt then transitioning to 3 point pinch placing 15 sponges into bucket      Manual Therapy   Manual Therapy Myofascial release;Edema management    Manual therapy comments Manual techniques completed prior  to exercises.    Edema Management Edema management technique completed to left hand, forearm, and fingers to reduce swelling in order to increase joint mobility.    Myofascial Release Myofascial release and manual stretching completed to left hand, wrist, and forearm to decrease fascial restrictions and increase joint mobility in a pain free zone.      Fine Motor Coordination (Hand/Wrist)   Fine Motor Coordination Manipulating coins;Flipping cards    Flipping cards Rotating card 2x then flipping 2x, completed 5 cards    Manipulating coins Pt working on in hand manipulation to translate coins from palm to fingertips. min/mod difficulty decreasing to min difficulty with practice. Occasionally dropping coins while manipulating.                      OT Short Term Goals - 03/18/21 0959       OT SHORT TERM GOAL #1   Title Patient will be educated and independent with HEP in order to faciliate her progress in therapy and return to using her LUE as her dominant extremity 75% or more of the time with all needed ADL tasks.    Time 3    Period Weeks    Status On-going    Target Date 04/04/21      OT SHORT TERM GOAL #2   Title Patient will increase her LUE wrist, hand, and forearm P/ROM to WNL while completing basic self care tasks with less difficulty.    Time 3    Period Weeks    Status On-going      OT SHORT TERM GOAL #3   Title Patient will increase her left grip strength to 8# and pinch strength to 8# overall in order to manipulate items and holding small objects and objects of moderate weight with increase strength.    Time 3    Period Weeks    Status On-going               OT Long Term Goals - 03/18/21 0959       OT LONG TERM GOAL #1   Title Patient will increase her LUE wrist, hand, and forearm A/ROM to [facility] while returning to using her LUE as her dominant extremity for 100% of required daily tasks.    Time 6    Period Weeks    Status On-going      OT LONG TERM  GOAL #2  Title Patient will increase her left wrist and forearm to 4/5 in order to be able to pick up and stabilize items of medium weight withut pain and dropping them.    Time 6    Period Weeks    Status On-going      OT LONG TERM GOAL #3   Title Patient will increase her left hand strength to 12# and pinch strength to 9-10# where needed in order to return to folding laundry and opening jars and containers with less difficulty.    Time 6    Period Weeks    Status On-going      OT LONG TERM GOAL #4   Title Patient will decrease her left UE fascial restrictions to min amount or less in order to increase functional mobility needed to complete all desired self care tasks such as simple meal prep.    Time 6    Period Weeks    Status On-going                   Plan - 04/07/21 1529     Clinical Impression Statement A: Continued with manual techniques, passive stretching, pt able to make a full fist today. Continued with wrist ROM increasing repetitions to 15. Continued with grip and pinch strengthening. Added in-hand manipulation task with coin activity and card flipping. Pt was able to progress to red clothespin during pinch activity. Verbal cuing for form and technique.    Body Structure / Function / Physical Skills ADL;UE functional use;Fascial restriction;Pain;FMC;Continence;ROM;IADL;Strength;Edema    Plan P: manual techniques prn, continue with grip and pinch strengthening, fine motor coordination tasks    OT Home Exercise Plan eval: hand A/ROM 6/30: wrist A/ROM; 7/12: yellow theraputty for grip and pinch strengthening    Consulted and Agree with Plan of Care Patient             Patient will benefit from skilled therapeutic intervention in order to improve the following deficits and impairments:   Body Structure / Function / Physical Skills: ADL, UE functional use, Fascial restriction, Pain, FMC, Continence, ROM, IADL, Strength, Edema       Visit Diagnosis: Pain in  left wrist  Stiffness of left wrist, not elsewhere classified  Other lack of coordination  Other symptoms and signs involving the musculoskeletal system    Problem List Patient Active Problem List   Diagnosis Date Noted   Syncope and collapse 01/13/2021   Lip swelling 10/30/2020   Right leg pain 10/30/2020   Tubular adenoma of colon 10/26/2017   Osteoporosis 07/28/2014   Diabetic hypertension-nephrosis syndrome ([place]) 03/23/2014   Stage 3b chronic kidney disease ([place]) 03/23/2014   Type 2 diabetes mellitus with stage 3a chronic kidney disease, with long-term current use of insulin ([place]) 09/29/2013   Allergic rhinitis 01/27/2013   GERD (gastroesophageal reflux disease) 01/12/2013   CVD (cardiovascular disease) 07/17/2011   Mixed hyperlipidemia 07/15/2007   Essential hypertension 07/15/2007    Guadelupe Sabin, OTR/L  [phone] 04/07/2021, 3:58 PM  [place] [address] [place], [place], [address] Phone: [phone]   Fax:  [phone]  Name: Emily Jennings MRN: [id number] Date of Birth: [birth year]

## 2021-04-12 ENCOUNTER — Other Ambulatory Visit: Payer: Self-pay

## 2021-04-12 ENCOUNTER — Encounter (HOSPITAL_COMMUNITY): Payer: Self-pay | Admitting: Occupational Therapy

## 2021-04-12 ENCOUNTER — Ambulatory Visit (HOSPITAL_COMMUNITY): Payer: Medicare Other | Admitting: Occupational Therapy

## 2021-04-12 DIAGNOSIS — R29898 Other symptoms and signs involving the musculoskeletal system: Secondary | ICD-10-CM | POA: Diagnosis not present

## 2021-04-12 DIAGNOSIS — M25632 Stiffness of left wrist, not elsewhere classified: Secondary | ICD-10-CM | POA: Diagnosis not present

## 2021-04-12 DIAGNOSIS — M25532 Pain in left wrist: Secondary | ICD-10-CM

## 2021-04-12 DIAGNOSIS — R278 Other lack of coordination: Secondary | ICD-10-CM | POA: Diagnosis not present

## 2021-04-12 NOTE — Therapy (Signed)
[place], [place], [address] Phone: [phone]   Fax:  [phone]  Occupational Therapy Treatment  Patient Details  Name: Emily Jennings MRN: [id number] Date of Birth: Jun 19, 1934 Referring Provider (OT): Larena Glassman, MD   Encounter Date: 04/12/2021   OT End of Session - 04/12/21 1553     Visit Number 8    Number of Visits 12    Date for OT Re-Evaluation 04/25/21    Authorization Type [employer] medicare    Authorization Time Period $30 copay, no visit limit, no authorization needed.    Progress Note Due on Visit 10    OT Start Time 1518    OT Stop Time 1557    OT Time Calculation (min) 39 min    Activity Tolerance Patient tolerated treatment well    Behavior During Therapy WFL for tasks assessed/performed             Past Medical History:  Diagnosis Date   Allergy    Anxiety    ANXIETY DISORDER, GENERALIZED 07/15/2007   Qualifier: Diagnosis of  By: Rennie Plowman     Arthritis    Cancer [facility]) 2009   breast, carcinoma in situ left   Carcinoma in situ of breast 05/21/2008   Qualifier: Diagnosis of  By: Moshe Cipro MD, Margaret  Diagnosed in 2009, completed 5 year course of tamoxifen, no evidence of recurrence    Carotid stenosis    11/16/2005  mild plaque formation and stenosis proximal right ECA   Cataract    Complication of anesthesia    Coronary artery disease    cardiac catheterization on 03/20/2006  LAD mid 40% stenosis, left circumflex mild 40% stenosis, RCA mid-vessel 40% to 50% lesion   EF 60%   Diabetes mellitus    GERD (gastroesophageal reflux disease)    Hernia, inguinal    left   Hyperglycemia    Hypertension    Insomnia 11/16/2011   Low blood potassium    Non-insulin dependent type 2 diabetes mellitus ([place])    Osteoporosis    Shortness of breath    2D Echocardiogram 01/26/2009   EF of greater than 55%, mild MR, mild TR, normal ventricular function   Thickened endometrium 10/26/2017   Noted by  gyne in 2017, missed 6 month follow up, referred in 09/2017   Ventricular tachycardia, non-sustained ([place])    developed during stress test 02/08/2006, spontaneously aborted, mild reversible apical defect    Past Surgical History:  Procedure Laterality Date   BREAST LUMPECTOMY Left 2009   Left breast 2009   CATARACT EXTRACTION W/PHACO Left 10/28/2014   Procedure: PHACO EMULSION CATARACT EXTRACTION WITH INTRAOCULAR LENS IMPLANT LEFT EYE ([place]);  Surgeon: Marylynn Pearson, MD;  Location: [place];  Service: Ophthalmology;  Laterality: Left;   COLONOSCOPY     cyst removed from left foot      There were no vitals filed for this visit.   Subjective Assessment - 04/12/21 1519     Subjective  S: It gets stiff and I think arthritis sets in.    Currently in Pain? No/denies                [facility] OT Assessment - 04/12/21 1519       Assessment   Medical Diagnosis left radius colles fracture      Precautions   Precautions None  OT Treatments/Exercises (OP) - 04/12/21 1519       Exercises   Exercises Wrist;Hand;Theraputty      Wrist Exercises   Wrist Flexion PROM;5 reps;Strengthening;15 reps    Bar Weights/Barbell (Wrist Flexion) 1 lb    Wrist Extension PROM;5 reps;Strengthening;15 reps    Bar Weights/Barbell (Wrist Extension) 1 lb    Wrist Radial Deviation Strengthening;15 reps    Bar Weights/Barbell (Radial Deviation) 1 lb    Wrist Ulnar Deviation Strengthening;15 reps    Bar Weights/Barbell (Ulnar Deviation) 1 lb    Other wrist exercises Wrist flexion stretch with arm extended 2x20"    Other wrist exercises Wrist extension stretch with arm extended 2x20"      Additional Wrist Exercises   Hand Gripper with Large Beads all beads with gripper set at 22# horizontal    Hand Gripper with Medium Beads all beads with gripper set at 22# horizontal    Hand Gripper with Small Beads all beads with gripper set at 22# horizontal      Fine Motor Coordination  (Hand/Wrist)   Fine Motor Coordination Grooved pegs    Grooved pegs Pt working on holding 5-6 pegs in palm while translating 1 peg to fingertips and placing into pegboard. Increased time for task, occasionally dropping pegs from ulnar side of hand.                      OT Short Term Goals - 03/18/21 0959       OT SHORT TERM GOAL #1   Title Patient will be educated and independent with HEP in order to faciliate her progress in therapy and return to using her LUE as her dominant extremity 75% or more of the time with all needed ADL tasks.    Time 3    Period Weeks    Status On-going    Target Date 04/04/21      OT SHORT TERM GOAL #2   Title Patient will increase her LUE wrist, hand, and forearm P/ROM to WNL while completing basic self care tasks with less difficulty.    Time 3    Period Weeks    Status On-going      OT SHORT TERM GOAL #3   Title Patient will increase her left grip strength to 8# and pinch strength to 8# overall in order to manipulate items and holding small objects and objects of moderate weight with increase strength.    Time 3    Period Weeks    Status On-going               OT Long Term Goals - 03/18/21 0959       OT LONG TERM GOAL #1   Title Patient will increase her LUE wrist, hand, and forearm A/ROM to [facility] while returning to using her LUE as her dominant extremity for 100% of required daily tasks.    Time 6    Period Weeks    Status On-going      OT LONG TERM GOAL #2   Title Patient will increase her left wrist and forearm to 4/5 in order to be able to pick up and stabilize items of medium weight withut pain and dropping them.    Time 6    Period Weeks    Status On-going      OT LONG TERM GOAL #3   Title Patient will increase her left hand strength to 12# and pinch strength to 9-10# where needed in order to  return to folding laundry and opening jars and containers with less difficulty.    Time 6    Period Weeks    Status On-going       OT LONG TERM GOAL #4   Title Patient will decrease her left UE fascial restrictions to min amount or less in order to increase functional mobility needed to complete all desired self care tasks such as simple meal prep.    Time 6    Period Weeks    Status On-going                   Plan - 04/12/21 1539     Clinical Impression Statement A: Pt reports she has been using her hand and wrist at home and can make a straight fist now. Continued with passive stretching, added wrist flexion/extension stretch with arm extended. Continued with grip strengthening, increasing hand gripper to 22# for small beads. Added grooved pegboard task to work on sustained hold of small objects as well as in-hand manipulation. Verbal cuing for form and technique, as well as LUE position while performing tasks.    Body Structure / Function / Physical Skills ADL;UE functional use;Fascial restriction;Pain;FMC;Continence;ROM;IADL;Strength;Edema    Plan P: progress note. passive stretching, continue with grip and pinch strengthening-attempt to increase hand gripper resistance for large and medium beads, fine motor coordination tasks    OT Home Exercise Plan eval: hand A/ROM 6/30: wrist A/ROM; 7/12: yellow theraputty for grip and pinch strengthening    Consulted and Agree with Plan of Care Patient             Patient will benefit from skilled therapeutic intervention in order to improve the following deficits and impairments:   Body Structure / Function / Physical Skills: ADL, UE functional use, Fascial restriction, Pain, FMC, Continence, ROM, IADL, Strength, Edema       Visit Diagnosis: Pain in left wrist  Stiffness of left wrist, not elsewhere classified  Other lack of coordination  Other symptoms and signs involving the musculoskeletal system    Problem List Patient Active Problem List   Diagnosis Date Noted   Syncope and collapse 01/13/2021   Lip swelling 10/30/2020   Right leg pain  10/30/2020   Tubular adenoma of colon 10/26/2017   Osteoporosis 07/28/2014   Diabetic hypertension-nephrosis syndrome ([place]) 03/23/2014   Stage 3b chronic kidney disease ([place]) 03/23/2014   Type 2 diabetes mellitus with stage 3a chronic kidney disease, with long-term current use of insulin ([place]) 09/29/2013   Allergic rhinitis 01/27/2013   GERD (gastroesophageal reflux disease) 01/12/2013   CVD (cardiovascular disease) 07/17/2011   Mixed hyperlipidemia 07/15/2007   Essential hypertension 07/15/2007    Guadelupe Sabin, OTR/L  [phone] 04/12/2021, 4:00 PM  [place] [address] [place], [place], [address] Phone: [phone]   Fax:  [phone]  Name: ROY SNUFFER MRN: [id number] Date of Birth: 1933-11-07

## 2021-04-13 DIAGNOSIS — H35363 Drusen (degenerative) of macula, bilateral: Secondary | ICD-10-CM | POA: Diagnosis not present

## 2021-04-13 DIAGNOSIS — H2511 Age-related nuclear cataract, right eye: Secondary | ICD-10-CM | POA: Diagnosis not present

## 2021-04-13 DIAGNOSIS — E119 Type 2 diabetes mellitus without complications: Secondary | ICD-10-CM | POA: Diagnosis not present

## 2021-04-14 ENCOUNTER — Ambulatory Visit (INDEPENDENT_AMBULATORY_CARE_PROVIDER_SITE_OTHER): Payer: Medicare Other | Admitting: Family Medicine

## 2021-04-14 ENCOUNTER — Encounter: Payer: Self-pay | Admitting: Family Medicine

## 2021-04-14 ENCOUNTER — Encounter (HOSPITAL_COMMUNITY): Payer: Self-pay | Admitting: Occupational Therapy

## 2021-04-14 ENCOUNTER — Other Ambulatory Visit: Payer: Self-pay

## 2021-04-14 ENCOUNTER — Ambulatory Visit (HOSPITAL_COMMUNITY): Payer: Medicare Other | Admitting: Occupational Therapy

## 2021-04-14 VITALS — BP 138/70 | HR 73 | Resp 16 | Ht 61.0 in | Wt 125.1 lb

## 2021-04-14 DIAGNOSIS — I10 Essential (primary) hypertension: Secondary | ICD-10-CM

## 2021-04-14 DIAGNOSIS — N1831 Chronic kidney disease, stage 3a: Secondary | ICD-10-CM | POA: Diagnosis not present

## 2021-04-14 DIAGNOSIS — M810 Age-related osteoporosis without current pathological fracture: Secondary | ICD-10-CM

## 2021-04-14 DIAGNOSIS — R29898 Other symptoms and signs involving the musculoskeletal system: Secondary | ICD-10-CM | POA: Diagnosis not present

## 2021-04-14 DIAGNOSIS — M25632 Stiffness of left wrist, not elsewhere classified: Secondary | ICD-10-CM

## 2021-04-14 DIAGNOSIS — R278 Other lack of coordination: Secondary | ICD-10-CM | POA: Diagnosis not present

## 2021-04-14 DIAGNOSIS — E1122 Type 2 diabetes mellitus with diabetic chronic kidney disease: Secondary | ICD-10-CM | POA: Diagnosis not present

## 2021-04-14 DIAGNOSIS — M25532 Pain in left wrist: Secondary | ICD-10-CM

## 2021-04-14 DIAGNOSIS — E782 Mixed hyperlipidemia: Secondary | ICD-10-CM | POA: Diagnosis not present

## 2021-04-14 DIAGNOSIS — Z794 Long term (current) use of insulin: Secondary | ICD-10-CM | POA: Diagnosis not present

## 2021-04-14 NOTE — Therapy (Signed)
[facility] [place], [place], [address] Phone: [phone]   Fax:  [phone]  Occupational Therapy Treatment  Patient Details  Name: Emily Jennings MRN: [id number] Date of Birth: Jun 10, 1934 Referring Provider (OT): Larena Glassman, MD   Encounter Date: 04/14/2021   OT End of Session - 04/14/21 1552     Visit Number 9    Number of Visits 12    Date for OT Re-Evaluation 04/25/21    Authorization Type [employer] medicare    Authorization Time Period $30 copay, no visit limit, no authorization needed.    Progress Note Due on Visit 61    OT Start Time 1515    OT Stop Time 1554    OT Time Calculation (min) 39 min    Activity Tolerance Patient tolerated treatment well    Behavior During Therapy WFL for tasks assessed/performed             Past Medical History:  Diagnosis Date   Allergy    Anxiety    ANXIETY DISORDER, GENERALIZED 07/15/2007   Qualifier: Diagnosis of  By: Rennie Plowman     Arthritis    Cancer [facility]) 2009   breast, carcinoma in situ left   Carcinoma in situ of breast 05/21/2008   Qualifier: Diagnosis of  By: Moshe Cipro MD, Margaret  Diagnosed in 2009, completed 5 year course of tamoxifen, no evidence of recurrence    Carotid stenosis    11/16/2005  mild plaque formation and stenosis proximal right ECA   Cataract    Complication of anesthesia    Coronary artery disease    cardiac catheterization on 03/20/2006  LAD mid 40% stenosis, left circumflex mild 40% stenosis, RCA mid-vessel 40% to 50% lesion   EF 60%   Diabetes mellitus    GERD (gastroesophageal reflux disease)    Hernia, inguinal    left   Hyperglycemia    Hypertension    Insomnia 11/16/2011   Low blood potassium    Non-insulin dependent type 2 diabetes mellitus ([place])    Osteoporosis    Shortness of breath    2D Echocardiogram 01/26/2009   EF of greater than 55%, mild MR, mild TR, normal ventricular function   Thickened endometrium 10/26/2017   Noted by  gyne in 2017, missed 6 month follow up, referred in 09/2017   Ventricular tachycardia, non-sustained ([place])    developed during stress test 02/08/2006, spontaneously aborted, mild reversible apical defect    Past Surgical History:  Procedure Laterality Date   BREAST LUMPECTOMY Left 2009   Left breast 2009   CATARACT EXTRACTION W/PHACO Left 10/28/2014   Procedure: PHACO EMULSION CATARACT EXTRACTION WITH INTRAOCULAR LENS IMPLANT LEFT EYE ([place]);  Surgeon: Marylynn Pearson, MD;  Location: [facility];  Service: Ophthalmology;  Laterality: Left;   COLONOSCOPY     cyst removed from left foot      There were no vitals filed for this visit.   Subjective Assessment - 04/14/21 1513     Subjective  S: I didn't get to do anything with my hand this morning.    Currently in Pain? No/denies                [facility] OT Assessment - 04/14/21 1513       Assessment   Medical Diagnosis left radius colles fracture      Precautions   Precautions None  OT Treatments/Exercises (OP) - 04/14/21 1518       Exercises   Exercises Wrist;Hand;Theraputty      Wrist Exercises   Wrist Flexion PROM;5 reps;Strengthening;15 reps    Bar Weights/Barbell (Wrist Flexion) 1 lb    Wrist Extension PROM;5 reps;Strengthening;15 reps    Bar Weights/Barbell (Wrist Extension) 1 lb    Wrist Radial Deviation Strengthening;15 reps    Bar Weights/Barbell (Radial Deviation) 1 lb    Wrist Ulnar Deviation Strengthening;15 reps    Bar Weights/Barbell (Ulnar Deviation) 1 lb    Other wrist exercises Wrist flexion stretch with arm extended 2x20"    Other wrist exercises Wrist extension stretch with arm extended 2x20"      Hand Exercises   Other Hand Exercises Pt using red clothespin and 3 point pinch to grasp and stack 16 high resistance sponges to a bucket. Pt then transitioning to lateral pinch placing 16 sponges into bucket      Theraputty   Theraputty - Flatten red-standing    Theraputty - Roll  red    Theraputty - Grip red    Theraputty - Pinch red 3 point and lateral      Fine Motor Coordination (Hand/Wrist)   Fine Motor Coordination Manipulating coins    Manipulating coins Pt working on in hand manipulation to translate coins from palm to fingertips. min difficulty. Occasionally dropping coins while manipulating.                      OT Short Term Goals - 03/18/21 0959       OT SHORT TERM GOAL #1   Title Patient will be educated and independent with HEP in order to faciliate her progress in therapy and return to using her LUE as her dominant extremity 75% or more of the time with all needed ADL tasks.    Time 3    Period Weeks    Status On-going    Target Date 04/04/21      OT SHORT TERM GOAL #2   Title Patient will increase her LUE wrist, hand, and forearm P/ROM to WNL while completing basic self care tasks with less difficulty.    Time 3    Period Weeks    Status On-going      OT SHORT TERM GOAL #3   Title Patient will increase her left grip strength to 8# and pinch strength to 8# overall in order to manipulate items and holding small objects and objects of moderate weight with increase strength.    Time 3    Period Weeks    Status On-going               OT Long Term Goals - 03/18/21 0959       OT LONG TERM GOAL #1   Title Patient will increase her LUE wrist, hand, and forearm A/ROM to [facility] while returning to using her LUE as her dominant extremity for 100% of required daily tasks.    Time 6    Period Weeks    Status On-going      OT LONG TERM GOAL #2   Title Patient will increase her left wrist and forearm to 4/5 in order to be able to pick up and stabilize items of medium weight withut pain and dropping them.    Time 6    Period Weeks    Status On-going      OT LONG TERM GOAL #3   Title Patient will increase her left  hand strength to 12# and pinch strength to 9-10# where needed in order to return to folding laundry and opening jars and  containers with less difficulty.    Time 6    Period Weeks    Status On-going      OT LONG TERM GOAL #4   Title Patient will decrease her left UE fascial restrictions to min amount or less in order to increase functional mobility needed to complete all desired self care tasks such as simple meal prep.    Time 6    Period Weeks    Status On-going                   Plan - 04/14/21 1539     Clinical Impression Statement A: Pt reports she has not been able to do her exercises since last visit. Continued with passive stretching and wrist strengthening. Pinch activity for lateral and 3 point, increased time for successful stacking on resistive sponges. Upgraded to red putty for grip and pinch strengthening. Verbal cuing for form and technique. Pt is making good progress towards her goals, ROM and strength continue to improve, pt is now able to make a full fist and perform sustained gripping activities. She is using her left hand during all ADLs with min difficulty.    Body Structure / Function / Physical Skills ADL;UE functional use;Fascial restriction;Pain;FMC;Continence;ROM;IADL;Strength;Edema    Plan P: passive stretching, continue with grip and pinch strengthening-attempt to increase hand gripper resistance for large and medium beads    OT Home Exercise Plan eval: hand A/ROM 6/30: wrist A/ROM; 7/12: yellow theraputty for grip and pinch strengthening    Consulted and Agree with Plan of Care Patient             Patient will benefit from skilled therapeutic intervention in order to improve the following deficits and impairments:   Body Structure / Function / Physical Skills: ADL, UE functional use, Fascial restriction, Pain, FMC, Continence, ROM, IADL, Strength, Edema       Visit Diagnosis: Pain in left wrist  Stiffness of left wrist, not elsewhere classified  Other lack of coordination  Other symptoms and signs involving the musculoskeletal system    Problem  List Patient Active Problem List   Diagnosis Date Noted   Syncope and collapse 01/13/2021   Lip swelling 10/30/2020   Right leg pain 10/30/2020   Tubular adenoma of colon 10/26/2017   Osteoporosis 07/28/2014   Diabetic hypertension-nephrosis syndrome ([place]) 03/23/2014   Stage 3b chronic kidney disease ([place]) 03/23/2014   Type 2 diabetes mellitus with stage 3a chronic kidney disease, with long-term current use of insulin ([place]) 09/29/2013   Allergic rhinitis 01/27/2013   GERD (gastroesophageal reflux disease) 01/12/2013   CVD (cardiovascular disease) 07/17/2011   Mixed hyperlipidemia 07/15/2007   Essential hypertension 07/15/2007    Guadelupe Sabin, OTR/L  [phone] 04/14/2021, 3:55 PM  [facility] [address] [place], [place], [address] Phone: [phone]   Fax:  [phone]  Name: Emily Jennings MRN: [id number] Date of Birth: [birth year]

## 2021-04-14 NOTE — Patient Instructions (Addendum)
Annual exam in office with MD , December 10 or after, please keep September appointment  Blood pressure is good  Try to get shingrix #1 vaccine in August  at your pharmacy   Please bring covid card to next visit  Continue exercise and good eating habits, sugar is excellent  Thanks for choosing [facility], we consider it a privelige to serve you.

## 2021-04-17 ENCOUNTER — Encounter: Payer: Self-pay | Admitting: Family Medicine

## 2021-04-17 NOTE — Assessment & Plan Note (Signed)
Managed by Endo and controlled Emily Jennings is reminded of the importance of commitment to daily physical activity for 30 minutes or more, as able and the need to limit carbohydrate intake to 30 to 60 grams per meal to help with blood sugar control.   The need to take medication as prescribed, test blood sugar as directed, and to call between visits if there is a concern that blood sugar is uncontrolled is also discussed.   Emily Jennings is reminded of the importance of daily foot exam, annual eye examination, and good blood sugar, blood pressure and cholesterol control.  Diabetic Labs Latest Ref Rng & Units 03/15/2021 01/04/2021 12/27/2020 12/07/2020 11/26/2020  HbA1c 0.0 - 7.0 % 6.9 - - - -  Microalbumin mg/L - - - - -  Micro/Creat Ratio <30 mcg/mg creat - - - - -  Chol <200 mg/dL - - - - -  HDL > OR = 50 mg/dL - - - - -  Calc LDL mg/dL (calc) - - - - -  Triglycerides <150 mg/dL - - - - -  Creatinine 0.57 - 1.00 mg/dL - 1.46(H) 1.34(H) 1.30(H) 1.49(H)   BP/Weight 04/14/2021 03/15/2021 03/10/2021 03/09/2021 02/09/2021 01/26/2021 7/61/6073  Systolic BP 710 626 948 - - - 546  Diastolic BP 70 66 78 - - - 74  Wt. (Lbs) 125.12 129.6 127.8 126 126 126 126.12  BMI 23.64 24.49 24.15 23.81 23.81 23.81 23.83   Foot/eye exam completion dates Latest Ref Rng & Units 11/10/2020 11/10/2020  Eye Exam No Retinopathy No Retinopathy No Retinopathy  Foot Form Completion - - -

## 2021-04-17 NOTE — Assessment & Plan Note (Signed)
Controlled, no change in medication DASH diet and commitment to daily physical activity for a minimum of 30 minutes discussed and encouraged, as a part of hypertension management. The importance of attaining a healthy weight is also discussed.  BP/Weight 04/14/2021 03/15/2021 03/10/2021 03/09/2021 02/09/2021 01/26/2021 04/06/9105  Systolic BP 816 619 694 - - - 098  Diastolic BP 68 66 78 - - - 74  Wt. (Lbs) 125.12 129.6 127.8 126 126 126 126.12  BMI 23.64 24.49 24.15 23.81 23.81 23.81 23.83

## 2021-04-17 NOTE — Assessment & Plan Note (Signed)
Continue fosamax weekly with calcium and vit D Daily weight bearing exercie encouraged

## 2021-04-17 NOTE — Progress Notes (Signed)
Emily Jennings     MRN: [id number]      DOB: [birth year]   HPI Emily Jennings is here for follow up and re-evaluation of chronic medical conditions, medication management and review of any available recent lab and radiology data.  Preventive health is updated, specifically  Cancer screening and Immunization.   Questions or concerns regarding consultations or procedures which the PT has had in the interim are  addressed. The PT denies any adverse reactions to current medications since the last visit.  There are no new concerns.  There are no specific complaints   ROS Denies recent fever or chills. Denies sinus pressure, nasal congestion, ear pain or sore throat. Denies chest congestion, productive cough or wheezing. Denies chest pains, palpitations aDenies abdominal pain, nausea, vomiting,diarrhea or constipation.   Denies dysuria, frequency, hesitancy or incontinence. Denies joint pain, swelling and limitation in mobility. Denies headaches, seizures, numbness, or tingling. Denies depression, anxiety or insomnia. Denies skin break down or rash.   PE BP 138/70   Pulse 73   Resp 16   Ht 5\' 1"  (1.549 m)   Wt 125 lb 1.9 oz (56.8 kg)   SpO2 94%   BMI 23.64 kg/m   Patient alert and oriented and in no cardiopulmonary distress.  HEENT: No facial asymmetry, EOMI,     Neck supple .  Chest: Clear to auscultation bilaterally.  CVS: S1, S2 no murmurs, no S3.Regular rate.  ABD: Soft non tender.   Ext: No edema  MS: Adequate ROM spine, shoulders, hips and knees.  Skin: Intact, no ulcerations or rash noted.  Psych: Good eye contact, normal affect. Memory intact not anxious or depressed appearing.  CNS: CN 2-12 intact, power,  normal throughout.no focal deficits noted.   Assessment & Plan  Essential hypertension Controlled, no change in medication DASH diet and commitment to daily physical activity for a minimum of 30 minutes discussed and encouraged, as a part of hypertension  management. The importance of attaining a healthy weight is also discussed.  BP/Weight 04/14/2021 03/15/2021 03/10/2021 03/09/2021 02/09/2021 01/26/2021 09/19/5850  Systolic BP 778 242 353 - - - 614  Diastolic BP 68 66 78 - - - 74  Wt. (Lbs) 125.12 129.6 127.8 126 126 126 126.12  BMI 23.64 24.49 24.15 23.81 23.81 23.81 23.83       Mixed hyperlipidemia Hyperlipidemia:Low fat diet discussed and encouraged. Updated lab needed at/ before next visit.    Lipid Panel  Lab Results  Component Value Date   CHOL 207 (H) 06/29/2020   HDL 115 06/29/2020   [place] 80 06/29/2020   TRIG 50 06/29/2020   CHOLHDL 1.8 06/29/2020   Needs to reduce fried and fatty foods    Type 2 diabetes mellitus with stage 3a chronic kidney disease, with long-term current use of insulin ([place]) Managed by Endo and controlled Emily Jennings is reminded of the importance of commitment to daily physical activity for 30 minutes or more, as able and the need to limit carbohydrate intake to 30 to 60 grams per meal to help with blood sugar control.   The need to take medication as prescribed, test blood sugar as directed, and to call between visits if there is a concern that blood sugar is uncontrolled is also discussed.   Emily Jennings is reminded of the importance of daily foot exam, annual eye examination, and good blood sugar, blood pressure and cholesterol control.  Diabetic Labs Latest Ref Rng & Units 03/15/2021 01/04/2021 12/27/2020 12/07/2020 11/26/2020  HbA1c  0.0 - 7.0 % 6.9 - - - -  Microalbumin mg/L - - - - -  Micro/Creat Ratio <30 mcg/mg creat - - - - -  Chol <200 mg/dL - - - - -  HDL > OR = 50 mg/dL - - - - -  Calc LDL mg/dL (calc) - - - - -  Triglycerides <150 mg/dL - - - - -  Creatinine 0.57 - 1.00 mg/dL - 1.46(H) 1.34(H) 1.30(H) 1.49(H)   BP/Weight 04/14/2021 03/15/2021 03/10/2021 03/09/2021 02/09/2021 01/26/2021 8/87/5797  Systolic BP 282 060 156 - - - 153  Diastolic BP 70 66 78 - - - 74  Wt. (Lbs) 125.12 129.6 127.8 126  126 126 126.12  BMI 23.64 24.49 24.15 23.81 23.81 23.81 23.83   Foot/eye exam completion dates Latest Ref Rng & Units 11/10/2020 11/10/2020  Eye Exam No Retinopathy No Retinopathy No Retinopathy  Foot Form Completion - - -        Osteoporosis Continue fosamax weekly with calcium and vit D Daily weight bearing exercie encouraged

## 2021-04-17 NOTE — Assessment & Plan Note (Addendum)
Hyperlipidemia:Low fat diet discussed and encouraged. Updated lab needed at/ before next visit.    Lipid Panel  Lab Results  Component Value Date   CHOL 207 (H) 06/29/2020   HDL 115 06/29/2020   LDLCALC 80 06/29/2020   TRIG 50 06/29/2020   CHOLHDL 1.8 06/29/2020   Needs to reduce fried and fatty foods

## 2021-04-19 ENCOUNTER — Ambulatory Visit (HOSPITAL_COMMUNITY): Payer: Medicare Other | Attending: Orthopedic Surgery

## 2021-04-19 ENCOUNTER — Other Ambulatory Visit: Payer: Self-pay

## 2021-04-19 ENCOUNTER — Encounter (HOSPITAL_COMMUNITY): Payer: Self-pay

## 2021-04-19 DIAGNOSIS — R278 Other lack of coordination: Secondary | ICD-10-CM | POA: Diagnosis not present

## 2021-04-19 DIAGNOSIS — M25632 Stiffness of left wrist, not elsewhere classified: Secondary | ICD-10-CM | POA: Insufficient documentation

## 2021-04-19 DIAGNOSIS — M25532 Pain in left wrist: Secondary | ICD-10-CM | POA: Diagnosis not present

## 2021-04-19 DIAGNOSIS — R29898 Other symptoms and signs involving the musculoskeletal system: Secondary | ICD-10-CM | POA: Diagnosis not present

## 2021-04-19 NOTE — Patient Instructions (Signed)
Theraputty Home Exercise Program  Complete 1-2 times a day.  putty squeeze  Pt. should squeeze putty in hand trying to keep it round by rotating putty after each squeeze. push fingers through putty to palm each time. Complete for ___3-5___ minutes.   PUTTY KEY GRIP  Hold the putty at the top of your hand. Squeeze the putty between your thumb and the side of your 2nd finger as shown. Complete for __3-5______ minutes.    PUTTY 3 JAW CHUCK  Roll up some putty into a ball then flatten it. Then, firmly squeeze it with your first 3 fingers as shown. Complete for __3-5____ minutes.    Wrist stretch at wall. Place hand against wall and try to get your palm as flat as possible. Hold for 15-20 seconds. Complete 2 times.       WRIST EXTENSOR STRETCH  Use your unaffected hand to bend the affected wrist down as shown.   Keep the elbow straight on the affected side the entire time.  Hold for 15-20 seconds. Complete 2 times.   Prayer Stretch  With the palms together and the thumbs crossed, slowly spread your elbows apart along the table top.  Hold for 15-20 seconds. Complete 2 times

## 2021-04-21 ENCOUNTER — Encounter (HOSPITAL_COMMUNITY): Payer: Medicare Other

## 2021-04-21 NOTE — Therapy (Signed)
[facility] [address] [place], [place], [address] Phone: [phone]   Fax:  [phone]  Occupational Therapy Treatment Reassessment and discharge  Patient Details  Name: Emily Jennings MRN: [id number] Date of Birth: [birth year] Referring Provider (OT): Larena Glassman, MD   Encounter Date: 04/19/2021   OT End of Session - 04/21/21 1054     Visit Number 10    Number of Visits 12    Authorization Type [employer] medicare    Authorization Time Period $30 copay, no visit limit, no authorization needed.    Progress Note Due on Visit 47    OT Start Time 1650   reassessment and discharge   OT Stop Time 1728    OT Time Calculation (min) 38 min    Activity Tolerance Patient tolerated treatment well    Behavior During Therapy WFL for tasks assessed/performed             Past Medical History:  Diagnosis Date   Allergy    Anxiety    ANXIETY DISORDER, GENERALIZED 07/15/2007   Qualifier: Diagnosis of  By: Rennie Plowman     Arthritis    Cancer [facility]) 2009   breast, carcinoma in situ left   Carcinoma in situ of breast 05/21/2008   Qualifier: Diagnosis of  By: Moshe Cipro MD, Margaret  Diagnosed in 2009, completed 5 year course of tamoxifen, no evidence of recurrence    Carotid stenosis    11/16/2005  mild plaque formation and stenosis proximal right ECA   Cataract    Complication of anesthesia    Coronary artery disease    cardiac catheterization on 03/20/2006  LAD mid 40% stenosis, left circumflex mild 40% stenosis, RCA mid-vessel 40% to 50% lesion   EF 60%   Diabetes mellitus    GERD (gastroesophageal reflux disease)    Hernia, inguinal    left   Hyperglycemia    Hypertension    Insomnia 11/16/2011   Low blood potassium    Non-insulin dependent type 2 diabetes mellitus ([place])    Osteoporosis    Shortness of breath    2D Echocardiogram 01/26/2009   EF of greater than 55%, mild MR, mild TR, normal ventricular function   Thickened endometrium  10/26/2017   Noted by gyne in 2017, missed 6 month follow up, referred in 09/2017   Ventricular tachycardia, non-sustained ([place])    developed during stress test 02/08/2006, spontaneously aborted, mild reversible apical defect    Past Surgical History:  Procedure Laterality Date   BREAST LUMPECTOMY Left 2009   Left breast 2009   CATARACT EXTRACTION W/PHACO Left 10/28/2014   Procedure: PHACO EMULSION CATARACT EXTRACTION WITH INTRAOCULAR LENS IMPLANT LEFT EYE ([place]);  Surgeon: Marylynn Pearson, MD;  Location: [facility];  Service: Ophthalmology;  Laterality: Left;   COLONOSCOPY     cyst removed from left foot      There were no vitals filed for this visit.   Subjective Assessment - 04/21/21 1055     Subjective  S: I'm using this hand for everything that I can at home.    Currently in Pain? No/denies               04/19/21 1659  Assessment  Medical Diagnosis left radius colles fracture  Precautions  Precautions None  ROM / Strength  AROM / PROM / Strength AROM;Strength  AROM  Right/Left Forearm Left  Left Forearm Pronation 90 Degrees (previous: 78)  Left Forearm Supination 90 Degrees (previous: 66)  Right/Left  Wrist Left  Left Wrist Extension 42 Degrees (previous: 32)  Left Wrist Flexion 35 Degrees (previous: 36)  Left Wrist Radial Deviation 40 Degrees (previous: 32)  Left Wrist Ulnar Deviation 34 Degrees (preivous: 14)  Strength  Left Forearm Pronation 5/5 (previous: 3/5)  Left Forearm Supination 5/5 (previous: 3/5)  Left Wrist Flexion 5/5 (previous: 3/5)  Left Wrist Extension 5/5 (previous: 3/5)  Left Wrist Radial Deviation 5/5 (previous: 3/5)  Left Wrist Ulnar Deviation 5/5 (previous: 3/5)  Right/Left Wrist Left  Right/Left hand Left  Left Hand Grip (lbs) 25 (previous: 0)  Left Hand Lateral Pinch 10 lbs (previous: 8)  Left Hand 3 Point Pinch 7 lbs (previous:4)                       OT Education - 04/21/21 1111     Education Details  updated HEP: continue with putty HEP, added wrist flexion and extension stretch.    Person(s) Educated Patient    Methods Explanation;Demonstration;Handout;Verbal cues    Comprehension Returned demonstration;Verbalized understanding              OT Short Term Goals - 04/19/21 1708       OT SHORT TERM GOAL #1   Title Patient will be educated and independent with HEP in order to faciliate her progress in therapy and return to using her LUE as her dominant extremity 75% or more of the time with all needed ADL tasks.    Time 3    Period Weeks    Status Achieved    Target Date 04/04/21      OT SHORT TERM GOAL #2   Title Patient will increase her LUE wrist, hand, and forearm P/ROM to WNL while completing basic self care tasks with less difficulty.    Time 3    Period Weeks    Status Partially Met      OT SHORT TERM GOAL #3   Title Patient will increase her left grip strength to 8# and pinch strength to 8# overall in order to manipulate items and holding small objects and objects of moderate weight with increase strength.    Time 3    Period Weeks    Status Achieved               OT Long Term Goals - 04/19/21 1708       OT LONG TERM GOAL #1   Title Patient will increase her LUE wrist, hand, and forearm A/ROM to [facility] while returning to using her LUE as her non-dominant extremity for 100% of required daily tasks.    Time 6    Period Weeks    Status Achieved      OT LONG TERM GOAL #2   Title Patient will increase her left wrist and forearm to 4/5 in order to be able to pick up and stabilize items of medium weight withut pain and dropping them.    Time 6    Period Weeks    Status Achieved      OT LONG TERM GOAL #3   Title Patient will increase her left hand strength to 12# and pinch strength to 9-10# where needed in order to return to folding laundry and opening jars and containers with less difficulty.    Time 6    Period Weeks    Status Achieved      OT LONG TERM  GOAL #4   Title Patient will decrease her left UE fascial restrictions to  min amount or less in order to increase functional mobility needed to complete all desired self care tasks such as simple meal prep.    Time 6    Period Weeks    Status Achieved                   Plan - 04/21/21 1059     Clinical Impression Statement A: Reassessment completed this date. Patient is able to demonstrate progress in hand strength as well as A/ROM of the wrist, elbow, and hand. Wrist flexion and extension continue to have ROM deficits. 2/3 STgs have been met with one additional goals partially met. All LTGs have been met. Patient reports that she is utilizing her left hand for all daily tasks and using it as her non dominant extremity as able. HEP was reviewed and an updated packet was provided. Discharge is recommended at this time and patient is in agreement.    Body Structure / Function / Physical Skills ADL;UE functional use;Fascial restriction;Pain;FMC;Continence;ROM;IADL;Strength;Edema    Plan P: D/C from skilled OT services with HEP.    Consulted and Agree with Plan of Care Patient             Patient will benefit from skilled therapeutic intervention in order to improve the following deficits and impairments:   Body Structure / Function / Physical Skills: ADL, UE functional use, Fascial restriction, Pain, FMC, Continence, ROM, IADL, Strength, Edema       Visit Diagnosis: Pain in left wrist  Stiffness of left wrist, not elsewhere classified  Other lack of coordination  Other symptoms and signs involving the musculoskeletal system    Problem List Patient Active Problem List   Diagnosis Date Noted   Syncope and collapse 01/13/2021   Lip swelling 10/30/2020   Right leg pain 10/30/2020   Tubular adenoma of colon 10/26/2017   Osteoporosis 07/28/2014   Diabetic hypertension-nephrosis syndrome ([place]) 03/23/2014   Stage 3b chronic kidney disease ([place]) 03/23/2014   Type 2  diabetes mellitus with stage 3a chronic kidney disease, with long-term current use of insulin ([place]) 09/29/2013   Allergic rhinitis 01/27/2013   GERD (gastroesophageal reflux disease) 01/12/2013   CVD (cardiovascular disease) 07/17/2011   Mixed hyperlipidemia 07/15/2007   Essential hypertension 07/15/2007    OCCUPATIONAL THERAPY DISCHARGE SUMMARY  Visits from Start of Care: 10  Current functional level related to goals / functional outcomes: See above   Remaining deficits: See above   Education / Equipment: See above   Patient agrees to discharge. Patient goals were met. Patient is being discharged due to meeting the stated rehab goals.Ailene Ravel, OTR/L,CBIS  [phone]  04/21/2021, 11:12 AM  [facility] [place], [place], [address] Phone: [phone]   Fax:  [phone]  Name: Emily Jennings MRN: [id number] Date of Birth: 1933-11-07

## 2021-04-25 ENCOUNTER — Encounter (HOSPITAL_COMMUNITY): Payer: Medicare Other

## 2021-05-19 ENCOUNTER — Ambulatory Visit: Payer: Medicare Other | Admitting: Family Medicine

## 2021-05-24 DIAGNOSIS — E782 Mixed hyperlipidemia: Secondary | ICD-10-CM | POA: Diagnosis not present

## 2021-05-24 DIAGNOSIS — I10 Essential (primary) hypertension: Secondary | ICD-10-CM | POA: Diagnosis not present

## 2021-05-25 ENCOUNTER — Ambulatory Visit: Payer: Medicare Other | Admitting: Family Medicine

## 2021-05-25 LAB — CMP14+EGFR
ALT: 18 IU/L (ref 0–32)
AST: 27 IU/L (ref 0–40)
Albumin/Globulin Ratio: 1.8 (ref 1.2–2.2)
Albumin: 4.2 g/dL (ref 3.6–4.6)
Alkaline Phosphatase: 113 IU/L (ref 44–121)
BUN/Creatinine Ratio: 20 (ref 12–28)
BUN: 34 mg/dL — ABNORMAL HIGH (ref 8–27)
Bilirubin Total: <0.2 mg/dL (ref 0.0–1.2)
CO2: 26 mmol/L (ref 20–29)
Calcium: 9.6 mg/dL (ref 8.7–10.3)
Chloride: 100 mmol/L (ref 96–106)
Creatinine, Ser: 1.71 mg/dL — ABNORMAL HIGH (ref 0.57–1.00)
Globulin, Total: 2.3 g/dL (ref 1.5–4.5)
Glucose: 197 mg/dL — ABNORMAL HIGH (ref 65–99)
Potassium: 4.4 mmol/L (ref 3.5–5.2)
Sodium: 139 mmol/L (ref 134–144)
Total Protein: 6.5 g/dL (ref 6.0–8.5)
eGFR: 29 mL/min/{1.73_m2} — ABNORMAL LOW (ref >59)

## 2021-05-25 LAB — LIPID PANEL
Chol/HDL Ratio: 1.7 ratio (ref 0.0–4.4)
Cholesterol, Total: 193 mg/dL (ref 100–199)
HDL: 116 mg/dL (ref >39)
LDL Chol Calc (NIH): 69 mg/dL (ref 0–99)
Triglycerides: 38 mg/dL (ref 0–149)
VLDL Cholesterol Cal: 8 mg/dL (ref 5–40)

## 2021-05-27 ENCOUNTER — Encounter (INDEPENDENT_AMBULATORY_CARE_PROVIDER_SITE_OTHER): Payer: Self-pay

## 2021-05-27 ENCOUNTER — Ambulatory Visit (INDEPENDENT_AMBULATORY_CARE_PROVIDER_SITE_OTHER): Payer: Medicare Other | Admitting: Family Medicine

## 2021-05-27 ENCOUNTER — Other Ambulatory Visit: Payer: Self-pay

## 2021-05-27 ENCOUNTER — Encounter: Payer: Self-pay | Admitting: Family Medicine

## 2021-05-27 VITALS — BP 150/75 | HR 70 | Temp 98.9°F | Resp 16 | Ht 61.0 in | Wt 127.0 lb

## 2021-05-27 DIAGNOSIS — R55 Syncope and collapse: Secondary | ICD-10-CM

## 2021-05-27 DIAGNOSIS — Z79899 Other long term (current) drug therapy: Secondary | ICD-10-CM

## 2021-05-27 DIAGNOSIS — E782 Mixed hyperlipidemia: Secondary | ICD-10-CM | POA: Diagnosis not present

## 2021-05-27 DIAGNOSIS — E1122 Type 2 diabetes mellitus with diabetic chronic kidney disease: Secondary | ICD-10-CM

## 2021-05-27 DIAGNOSIS — N1831 Chronic kidney disease, stage 3a: Secondary | ICD-10-CM | POA: Diagnosis not present

## 2021-05-27 DIAGNOSIS — Z23 Encounter for immunization: Secondary | ICD-10-CM | POA: Diagnosis not present

## 2021-05-27 DIAGNOSIS — I10 Essential (primary) hypertension: Secondary | ICD-10-CM | POA: Diagnosis not present

## 2021-05-27 DIAGNOSIS — Z794 Long term (current) use of insulin: Secondary | ICD-10-CM | POA: Diagnosis not present

## 2021-05-27 NOTE — Patient Instructions (Signed)
Annual exam as before  You are referred to Pharmacist for help with medication management and adherence  Flu vaccine today  Please set out and take medciations every day as prescrinbed  Drink 64 Oz water every day Non fasting HBA1C and chm 7 and EGFRfirst week in October  It is important that you exercise regularly at least 30 minutes 5 times a week. If you develop chest pain, have severe difficulty breathing, or feel very tired, stop exercising immediately and seek medical attention  Thanks for choosing [facility], we consider it a privelige to serve you.

## 2021-05-27 NOTE — Progress Notes (Signed)
Emily Jennings     MRN: [id number]      DOB: 1934/05/11   HPI Emily Jennings is here for follow up and re-evaluation of chronic medical conditions, medication management and review of any available recent lab and radiology data.  Preventive health is updated, specifically  Cancer screening and Immunization.   Questions or concerns regarding consultations or procedures which the PT has had in the interim are  addressed.  Slipped and fell in her home while moving quickly 2 weeks ago, had on flip flops broke fall with right hand ,  has full rom Reports she sometimes to forgts to take evening meds, has a rather complicated regime, needs multiple drugs for her blood pressure which is a challenge, sttaes she forgot medication today, though she is trying her best ROS Denies recent fever or chills. Denies sinus pressure, nasal congestion, ear pain or sore throat. Denies chest congestion, productive cough or wheezing. Denies chest pains, palpitations and leg swelling Denies abdominal pain, nausea, vomiting,diarrhea or constipation.   Denies dysuria, frequency, hesitancy or incontinence. Right hand pain following recent fall, though has full ROM. Denies headaches, seizures, numbness, or tingling. Denies depression, anxiety or insomnia. Denies skin break down or rash.   PE  BP (!) 150/75 (BP Location: Right Arm, Patient Position: Sitting, Cuff Size: Normal)   Pulse 70   Temp 98.9 F (37.2 C)   Resp 16   Ht 5\' 1"  (1.549 m)   Wt 127 lb (57.6 kg)   SpO2 94%   BMI 24.00 kg/m   Patient alert and oriented and in no cardiopulmonary distress.  HEENT: No facial asymmetry, EOMI,     Neck supple .  Chest: Clear to auscultation bilaterally.  CVS: S1, S2 no murmurs, no S3.Regular rate.  ABD: Soft non tender.   Ext: No edema  MS: Adequate ROM spine, shoulders, hips and knees.  Skin: Intact, no ulcerations or rash noted.  Psych: Good eye contact, normal affect. Memory intact not anxious or  depressed appearing.  CNS: CN 2-12 intact, power,  normal throughout.no focal deficits noted.   Assessment & Plan  Essential hypertension Improved , on multiple meds which are difficult to keep up with, uncertain if less complex regime is possible, refer pharmacy,changing pharmacy to get pre packaged is also a good option , once pill burden detrmined DASH diet and commitment to daily physical activity for a minimum of 30 minutes discussed and encouraged, as a part of hypertension management. The importance of attaining a healthy weight is also discussed.  BP/Weight 05/27/2021 04/14/2021 03/15/2021 03/10/2021 03/09/2021 02/09/2021 2/37/6283  Systolic BP 151 761 607 371 - - -  Diastolic BP 75 70 66 78 - - -  Wt. (Lbs) 127 125.12 129.6 127.8 126 126 126  BMI 24 23.64 24.49 24.15 23.81 23.81 23.81       Mixed hyperlipidemia Hyperlipidemia:Low fat diet discussed and encouraged.   Lipid Panel  Lab Results  Component Value Date   CHOL 193 05/24/2021   HDL 116 05/24/2021   LDLCALC 69 05/24/2021   TRIG 38 05/24/2021   CHOLHDL 1.7 05/24/2021     Controlled, no change in medication   Syncope and collapse Following hypoglycemic episode, med has been sbsequently adjusted by Endo  Type 2 diabetes mellitus with stage 3a chronic kidney disease, with long-term current use of insulin [facility]) Emily Jennings is reminded of the importance of commitment to daily physical activity for 30 minutes or more, as able and the need to  limit carbohydrate intake to 30 to 60 grams per meal to help with blood sugar control.   The need to take medication as prescribed, test blood sugar as directed, and to call between visits if there is a concern that blood sugar is uncontrolled is also discussed.   Emily Jennings is reminded of the importance of daily foot exam, annual eye examination, and good blood sugar, blood pressure and cholesterol control. Has 6 month endo follow up, will check HBA1C in the interim asdescribes  fluctuations in BG  Diabetic Labs Latest Ref Rng & Units 05/24/2021 03/15/2021 01/04/2021 12/27/2020 12/07/2020  HbA1c 0.0 - 7.0 % - 6.9 - - -  Microalbumin mg/L - - - - -  Micro/Creat Ratio <30 mcg/mg creat - - - - -  Chol 100 - 199 mg/dL 193 - - - -  HDL >39 mg/dL 116 - - - -  Calc LDL 0 - 99 mg/dL 69 - - - -  Triglycerides 0 - 149 mg/dL 38 - - - -  Creatinine 0.57 - 1.00 mg/dL 1.71(H) - 1.46(H) 1.34(H) 1.30(H)   BP/Weight 05/27/2021 04/14/2021 03/15/2021 03/10/2021 03/09/2021 02/09/2021 10/26/221  Systolic BP 361 224 497 530 - - -  Diastolic BP 75 70 66 78 - - -  Wt. (Lbs) 127 125.12 129.6 127.8 126 126 126  BMI 24 23.64 24.49 24.15 23.81 23.81 23.81   Foot/eye exam completion dates Latest Ref Rng & Units 11/10/2020 11/10/2020  Eye Exam No Retinopathy No Retinopathy No Retinopathy  Foot Form Completion - - -

## 2021-05-29 ENCOUNTER — Encounter: Payer: Self-pay | Admitting: Family Medicine

## 2021-05-29 ENCOUNTER — Other Ambulatory Visit: Payer: Self-pay | Admitting: Family Medicine

## 2021-05-29 NOTE — Assessment & Plan Note (Signed)
Improved , on multiple meds which are difficult to keep up with, uncertain if less complex regime is possible, refer pharmacy,changing pharmacy to get pre packaged is also a good option , once pill burden detrmined DASH diet and commitment to daily physical activity for a minimum of 30 minutes discussed and encouraged, as a part of hypertension management. The importance of attaining a healthy weight is also discussed.  BP/Weight 05/27/2021 04/14/2021 03/15/2021 03/10/2021 03/09/2021 02/09/2021 10/10/4823  Systolic BP 003 704 888 916 - - -  Diastolic BP 75 70 66 78 - - -  Wt. (Lbs) 127 125.12 129.6 127.8 126 126 126  BMI 24 23.64 24.49 24.15 23.81 23.81 23.81

## 2021-05-29 NOTE — Assessment & Plan Note (Signed)
Emily Jennings is reminded of the importance of commitment to daily physical activity for 30 minutes or more, as able and the need to limit carbohydrate intake to 30 to 60 grams per meal to help with blood sugar control.   The need to take medication as prescribed, test blood sugar as directed, and to call between visits if there is a concern that blood sugar is uncontrolled is also discussed.   Ms. Horkey is reminded of the importance of daily foot exam, annual eye examination, and good blood sugar, blood pressure and cholesterol control. Has 6 month endo follow up, will check HBA1C in the interim asdescribes fluctuations in BG  Diabetic Labs Latest Ref Rng & Units 05/24/2021 03/15/2021 01/04/2021 12/27/2020 12/07/2020  HbA1c 0.0 - 7.0 % - 6.9 - - -  Microalbumin mg/L - - - - -  Micro/Creat Ratio <30 mcg/mg creat - - - - -  Chol 100 - 199 mg/dL 193 - - - -  HDL >39 mg/dL 116 - - - -  Calc LDL 0 - 99 mg/dL 69 - - - -  Triglycerides 0 - 149 mg/dL 38 - - - -  Creatinine 0.57 - 1.00 mg/dL 1.71(H) - 1.46(H) 1.34(H) 1.30(H)   BP/Weight 05/27/2021 04/14/2021 03/15/2021 03/10/2021 03/09/2021 02/09/2021 1/56/1537  Systolic BP 943 276 147 092 - - -  Diastolic BP 75 70 66 78 - - -  Wt. (Lbs) 127 125.12 129.6 127.8 126 126 126  BMI 24 23.64 24.49 24.15 23.81 23.81 23.81   Foot/eye exam completion dates Latest Ref Rng & Units 11/10/2020 11/10/2020  Eye Exam No Retinopathy No Retinopathy No Retinopathy  Foot Form Completion - - -

## 2021-05-29 NOTE — Assessment & Plan Note (Signed)
Hyperlipidemia:Low fat diet discussed and encouraged.   Lipid Panel  Lab Results  Component Value Date   CHOL 193 05/24/2021   HDL 116 05/24/2021   LDLCALC 69 05/24/2021   TRIG 38 05/24/2021   CHOLHDL 1.7 05/24/2021     Controlled, no change in medication

## 2021-05-29 NOTE — Assessment & Plan Note (Signed)
Following hypoglycemic episode, med has been sbsequently adjusted by Endo

## 2021-06-01 ENCOUNTER — Telehealth: Payer: Self-pay | Admitting: *Deleted

## 2021-06-01 NOTE — Chronic Care Management (AMB) (Signed)
  Chronic Care Management   Outreach Note  06/01/2021 Name: [name] MRN: [id number] DOB: November 04, 1933  Emily Jennings is a 87 y.o. year old female who is a primary care patient of Moshe Cipro Nor[place] Levo, MD. I reached out to [name] by phone today in response to a referral sent by Ms. Daniella W Wroe's PCP, Dr. Moshe Cipro.      An unsuccessful telephone outreach was attempted today. The patient was referred to the case management team for assistance with care management and care coordination.   Follow Up Plan: A HIPAA compliant phone message was left for the patient providing contact information and requesting a return call. The care management team will reach out to the patient again over the next 7 days.  If patient returns call to provider office, please advise to call [facility]  Direct Dial: [phone]

## 2021-06-01 NOTE — Chronic Care Management (AMB) (Signed)
  Chronic Care Management   Note  06/01/2021 Name: Emily Jennings MRN: [id number] DOB: 05/01/1934  Emily Jennings is a 87 y.o. year old female who is a primary care patient of Moshe Cipro [place] Levo, MD. I reached out to [name] by phone today in response to a referral sent by Emily Jennings's PCP, Dr. Moshe Cipro.      Emily Jennings was given information about Chronic Care Management services today including:  CCM service includes personalized support from designated clinical staff supervised by her physician, including individualized plan of care and coordination with other care providers 24/7 contact phone numbers for assistance for urgent and routine care needs. Service will only be billed when office clinical staff spend 20 minutes or more in a month to coordinate care. Only one practitioner may furnish and bill the service in a calendar month. The patient may stop CCM services at any time (effective at the end of the month) by phone call to the office staff. The patient will be responsible for cost sharing (co-pay) of up to 20% of the service fee (after annual deductible is met).  Patient agreed to services and verbal consent obtained.   Follow up plan: Face to Face appointment with care management team member scheduled for: 06/16/21  [facility]  Direct Dial: [phone]

## 2021-06-05 ENCOUNTER — Other Ambulatory Visit: Payer: Self-pay | Admitting: Family Medicine

## 2021-06-14 ENCOUNTER — Other Ambulatory Visit: Payer: Self-pay | Admitting: Family Medicine

## 2021-06-16 ENCOUNTER — Ambulatory Visit (INDEPENDENT_AMBULATORY_CARE_PROVIDER_SITE_OTHER): Payer: Medicare Other | Admitting: Pharmacist

## 2021-06-16 ENCOUNTER — Other Ambulatory Visit: Payer: Self-pay

## 2021-06-16 DIAGNOSIS — N1831 Chronic kidney disease, stage 3a: Secondary | ICD-10-CM

## 2021-06-16 DIAGNOSIS — Z794 Long term (current) use of insulin: Secondary | ICD-10-CM

## 2021-06-16 DIAGNOSIS — M810 Age-related osteoporosis without current pathological fracture: Secondary | ICD-10-CM

## 2021-06-16 DIAGNOSIS — N1832 Chronic kidney disease, stage 3b: Secondary | ICD-10-CM

## 2021-06-16 DIAGNOSIS — E782 Mixed hyperlipidemia: Secondary | ICD-10-CM

## 2021-06-16 DIAGNOSIS — I10 Essential (primary) hypertension: Secondary | ICD-10-CM

## 2021-06-16 NOTE — Patient Instructions (Signed)
Emily Jennings,  It was great to talk to you today!  Please call me with any questions or concerns.   Visit Information   PATIENT GOALS:   Goals Addressed             This Visit's Progress    Medication Management       Patient Goals/Self-Care Activities Over the next 90 days, patient will:  Focus on medication adherence by keeping up with prescription refills and either using a pill box or reminders to take your medications at the prescribed times Check blood sugar three times a day at the following times: 5-15 minutes before breakfast, 5-15 minutes before dinner, bedtime, and whenever patient experiences symptoms of hypo/hyperglycemia, document, and provide at future appointments Check blood pressure at least once daily, document, and provide at future appointments        Consent to CCM Services: Emily Jennings was given information about Chronic Care Management services including:  CCM service includes personalized support from designated clinical staff supervised by her physician, including individualized plan of care and coordination with other care providers 24/7 contact phone numbers for assistance for urgent and routine care needs. Service will only be billed when office clinical staff spend 20 minutes or more in a month to coordinate care. Only one practitioner may furnish and bill the service in a calendar month. The patient may stop CCM services at any time (effective at the end of the month) by phone call to the office staff. The patient will be responsible for cost sharing (co-pay) of up to 20% of the service fee (after annual deductible is met).  Patient agreed to services and verbal consent obtained.   The patient verbalized understanding of instructions, educational materials, and care plan provided today and declined offer to receive copy of patient instructions, educational materials, and care plan.   Face to Face appointment with care management team member  scheduled for: 07/07/21  Kennon Holter, PharmD Clinical Pharmacist [facility] [phone]  CLINICAL CARE PLAN: Patient Care Plan: Medication Management     Problem Identified: HTN, T2DM, Osteoporosis, HLD, CKD   Priority: High  Onset Date: 06/16/2021     Long-Range Goal: Disease Progression Prevention   Start Date: 06/16/2021  Expected End Date: 09/14/2021  This Visit's Progress: On track  Priority: High  Note:   Current Barriers:  Unable to independently monitor therapeutic efficacy Unable to achieve control of diabetes and hypertension  Pharmacist Clinical Goal(s):  Over the next 90 days, patient will Achieve adherence to monitoring guidelines and medication adherence to achieve therapeutic efficacy Achieve control of diabetes and hypertension as evidenced by improved fasting blood sugar, improved A1c, and improved blood pressure control through collaboration with PharmD and provider.   Interventions: 1:1 collaboration with Emily Helper, MD regarding development and update of comprehensive plan of care as evidenced by provider attestation and co-signature Inter-disciplinary care team collaboration (see longitudinal plan of care) Comprehensive medication review performed; medication list updated in electronic medical record  Type 2 Diabetes - followed by endocrinology Dr. Dorris Jennings and Emily Jennings: Uncontrolled; Most recent A1c above goal of <7% per ADA guidelines Current medications: Humalog Mix 75/25 10-15 units subcutaneously before breakfast and dinner Intolerances:  metformin (diarrhea) Taking medications as directed: no, patient is prescribed 10 units subcutaneously twice daily before meals; however, patient correcting for hyperglycemia and reports taking 12-15 units if blood glucose elevated in the 200s  Side effects thought to be attributed to current medication regimen: yes, patient  reports hypoglyecmia 2-3 times per week and had a hypoglycemia  event back in April 2022 that caused the patient to fall and fracture her wrist Hypoglycemia prevention: none but will recommend Current meal patterns:  patient reports that she does not eat very much; small portions. Mostly only likes to eat 2 meals per day but since she is on insulin feels like she has to eat again in the afternoon or her blood glucose will drop too low Current exercise: not discussed today On a statin: yes On aspirin 81 mg daily: was taking 325 mg but recommended to decrease dose to 81 mg Last albumin/cr ration: 1090; on an ACEi/ARB: yes Pneumonia vaccine: series complete Influenza vaccine: up to date Current glucose readings:  patient does not have logs or OneTouch meter with her today. Verbally reports that her blood glucose mostly fluctuates between 90-200s fasting and pre-prandial but blood glucose is 50-100s at bedtime Continue Humalog Mix 75/25 10 units subcutaneously before breakfast and dinner Check c-peptide Instructed to monitor blood sugars three times a day at the following times: 5-15 minutes before breakfast, 5-15 minutes before dinner, bedtime, and whenever patient experiences symptoms of hypo/hyperglycemia  Consider decreasing insulin requirements given frequent hypoglycemia. Will send message to endocrinology. If patient does not have contraindications to GLP-1 agonist therapy and has sufficient endogenous insulin production then patient may benefit from a once weekly GLP-1 agonist rather than insulin therapy given high risk of hypoglycemia. Can discuss affordability options with patient if that is a barrier. Patient given blood glucose log in clinic today. Instructed to fill out and return to follow-up with log and blood glucose meter  Hypertension: Blood pressure under poor control. Blood pressure is above goal of <130/80 mmHg per 2017 AHA/ACC guidelines. Current medications: losartan 100 mg by mouth every evening, amlodipine 10 mg by mouth every morning and  2.5 mg by mouth every evening, spironolactone 25 mg by mouth once daily, and metoprolol tartrate 50 mg by mouth twice daily Intolerances:  to be determined; patient is complaining of swelling in her feet. Amlodipine the likely culprit Taking medications as directed: yes, patient reports improved adherence recently. Side effects thought to be attributed to current medication regimen:  possible peripheral edema from amlodipine Current home blood pressure: patient has a machine at home but is not checking  Encourage dietary sodium restriction/DASH diet Recommend regular aerobic exercise Discussed need for medication compliance Reviewed risks of hypertension, principles of treatment and consequences of untreated hypertension Patient given blood pressure log in clinic today and instructed to check blood pressure daily and document   In an effort to simplify regimen for PCP referral, I would recommend the following changes:  Decrease amlodipine to 10 mg by mouth daily. Patient reports peripheral edema which is most likely associated with high dose of amlodipine. May need to lower even further but can see if edema improves after lowering amlodipine to 10 mg daily To improve adherence, would recommend combining amlodipine with ARB. Recommend switching from amlodipine and losartan to Azor (amlodipine-olmesartan 10-40 mg by mouth daily) Consider switching beta blocker therapy from metoprolol tartrate to carvedilol given alpha-1 blocking properties making it a more effective blood pressure lowering beta blocker. Equivalent dose conversion would be carvedilol 12.5 mg by mouth twice daily  Continue spironolactone 25 mg by mouth daily Given current renal function, additional blood pressure lowering options limited to hydralazine if additional blood pressure lowering therapy is needed once the above regimen is simplified and patient improves adherence   Hyperlipidemia:  Controlled. LDL at goal of <70 due to very  high risk given diabetes + at least 1 additional major risk factor (hypertension, chronic kidney disease (CKD), and family history of ASCVD) per 2020 AACE/ACE guidelines. Triglycerides at goal of <150 per 2020 AACE/ACE guidelines. Current medications: rosuvastatin 5 mg by mouth once daily Intolerances:  rosuvastatin (patient reports she thinks this causes swelling of her feet; more likely to be associated with amlodipine Taking medications as directed: no, patient reports that she has stopped taking rosuvastatin and does not want to take it anymore Side effects thought to be attributed to current medication regimen:  patient reports she thinks her feet were swelling from rosuvastatin Encourage dietary reduction of high fat containing foods such as butter, nuts, bacon, egg yolks, etc. Recommend regular aerobic exercise Reviewed risks of hyperlipidemia, principles of treatment and consequences of untreated hyperlipidemia Discussed need for medication compliance Re-check lipid panel in 4-12 weeks Consider switching from rosuvastatin to atorvastatin 10 mg by mouth daily  Chronic Kidney Disease Stage 3b - GFR 30-44 (Moderate to Severely Reduced Function): Appropriately managed Current medications: losartan 100 mg by mouth once daily, spironolactone 25 mg by mouth once daily, and rosuvastatin 5 mg by mouth once daily Most recent GFR: 29 mL/min Most recent albumin/cr ratio: 1090 on ARB Encouraged healthy diet including more fruits and vegetables Recommend adequate hydration  Discussed need for medication compliance Discussed need for improved control of blood pressure Discussed need for improved control of blood sugar Continue ARB, MRA, and statin  Osteoporosis: Suboptimally managed Current medications: alendronate (Fosamax) 70 mg by mouth once weekly and vitamin D3 2,000 units by mouth daily Intolerances: none Taking medications as directed: yes Side effects thought to be attributed to current  medication regimen: no Last DEXA unknown Falls reported in last year: 1 that lead to wrist fracture in April 2022 Continue alendronate (Fosamax) 70 mg by mouth once weekly and vitamin D3 2,000 units by mouth daily Recommend 1,000 mg of elemental calcium per day from diet or supplementation. Supplemental calcium not necessary if appropriate amount obtained through diet. Recommend walking, low impact aerobic exercise, or strength training Consider bisphosphonate holiday since patient has been on therapy since 2015. May need to consider switching to Prolia.   Patient Goals/Self-Care Activities Over the next 90 days, patient will:  Focus on medication adherence by keeping up with prescription refills and either using a pill box or reminders to take your medications at the prescribed times Check blood sugar three times a day at the following times: 5-15 minutes before breakfast, 5-15 minutes before dinner, bedtime, and whenever patient experiences symptoms of hypo/hyperglycemia, document, and provide at future appointments Check blood pressure at least once daily, document, and provide at future appointments  Follow Up Plan: Face to Face appointment with care management team member scheduled for: 07/07/21

## 2021-06-16 NOTE — Chronic Care Management (AMB) (Addendum)
Chronic Care Management Pharmacy Note  06/16/2021 Name:  Emily Jennings MRN:  [id number] DOB:  1934-08-20  Summary: Type 2 Diabetes - followed by endocrinology Dr. Dorris Fetch and [name]: Patient is prescribed 10 units subcutaneously twice daily before meals; however, patient correcting for hyperglycemia and reports taking 12-15 units if blood glucose elevated in the 200s  Patient reports hypoglyecmia 2-3 times per week and had a hypoglycemia event back in April 2022 that caused the patient to fall and fracture her wrist Hypoglycemia prevention: none recommend glucagon kit Current glucose readings: patient does not have logs or OneTouch meter with her today. Verbally reports that her blood glucose mostly fluctuates between 90-200s fasting and pre-prandial but blood glucose is 50-100s at bedtime Consider checking c-peptide Consider decreasing insulin requirements given frequent hypoglycemia. Will send message to endocrinology. If patient does not have contraindications to GLP-1 agonist therapy and has sufficient endogenous insulin production then patient may benefit from a once weekly GLP-1 agonist rather than insulin therapy given high risk of hypoglycemia. Can discuss affordability options with patient if that is a barrier. Patient given blood glucose log in clinic today. Instructed to fill out and return to follow-up with log and blood glucose meter  Hypertension: In an effort to simplify regimen for PCP referral, I would recommend the following changes:  Decrease amlodipine to 10 mg by mouth daily. Patient reports peripheral edema which is most likely associated with high dose of amlodipine. May need to lower even further but can see if edema improves after lowering amlodipine to 10 mg daily To improve adherence, would recommend combining amlodipine with ARB. Recommend switching from amlodipine and losartan to Azor (amlodipine-olmesartan 10-40 mg by mouth daily) Consider switching beta blocker  therapy from metoprolol tartrate to carvedilol given alpha-1 blocking properties making it a more effective blood pressure lowering beta blocker. Equivalent dose conversion would be carvedilol 12.5 mg by mouth twice daily  Continue spironolactone 25 mg by mouth daily Given current renal function, additional blood pressure lowering options limited to hydralazine if additional blood pressure lowering therapy is needed once the above regimen is simplified and patient improves adherence   Hyperlipidemia: Patient reports that she has stopped taking rosuvastatin and does not want to take it anymore because she thinks it is causing her feet to swell Consider switching from rosuvastatin to atorvastatin 10 mg by mouth daily  Osteoporosis: Recommend 1,000 mg of elemental calcium per day from diet or supplementation. Supplemental calcium not necessary if appropriate amount obtained through diet. Consider bisphosphonate holiday since patient has been on therapy since 2015. May need to consider switching to Prolia.   Other: Patient reports taking Tylenol PM and aspirin 325 mg occasionally. Instructed patient to only take regular Tylenol and to avoid Tylenol PM since is contains Benadryl and anticholinergic properties are not ideal given her age. Additionally, patient instructed to decrease to aspirin 81 mg by mouth daily.   Subjective: Emily Jennings is an 87 y.o. year old female who is a primary patient of Moshe Cipro, Norwood Levo, MD.  The CCM team was consulted for assistance with disease management and care coordination needs.    Engaged with patient face to face for initial visit in response to provider referral for pharmacy case management and/or care coordination services.   Consent to Services:  The patient was given the following information about Chronic Care Management services today, agreed to services, and gave verbal consent: 1. CCM service includes personalized support from designated clinical staff  supervised by the primary care provider, including individualized plan of care and coordination with other care providers 2. 24/7 contact phone numbers for assistance for urgent and routine care needs. 3. Service will only be billed when office clinical staff spend 20 minutes or more in a month to coordinate care. 4. Only one practitioner may furnish and bill the service in a calendar month. 5.The patient may stop CCM services at any time (effective at the end of the month) by phone call to the office staff. 6. The patient will be responsible for cost sharing (co-pay) of up to 20% of the service fee (after annual deductible is met). Patient agreed to services and consent obtained.  Patient Care Team: [name], MD as PCP - General (Family Medicine) Cassandria Anger, MD as Consulting Physician (Endocrinology) Marylynn Pearson, MD as Consulting Physician (Ophthalmology) Beryle Lathe, [facility] (Pharmacist)  Objective:  Lab Results  Component Value Date   CREATININE 1.71 (H) 05/24/2021   CREATININE 1.46 (H) 01/04/2021   CREATININE 1.34 (H) 12/27/2020    Lab Results  Component Value Date   HGBA1C 6.9 03/15/2021   Last diabetic Eye exam:  Lab Results  Component Value Date/Time   HMDIABEYEEXA No Retinopathy 11/10/2020 01:18 PM    Last diabetic Foot exam: No results found for: HMDIABFOOTEX      Component Value Date/Time   CHOL 193 05/24/2021 0852   TRIG 38 05/24/2021 0852   HDL 116 05/24/2021 0852   CHOLHDL 1.7 05/24/2021 0852   CHOLHDL 1.8 06/29/2020 0916   VLDL 8 10/31/2016 0845   LDLCALC 69 05/24/2021 0852   LDLCALC 80 06/29/2020 0916    Hepatic Function Latest Ref Rng & Units 05/24/2021 12/27/2020 11/26/2020  Total Protein 6.0 - 8.5 g/dL 6.5 6.7 7.0  Albumin 3.6 - 4.6 g/dL 4.2 3.9 4.6  AST 0 - 40 IU/L 27 32 32  ALT 0 - 32 IU/L 18 21 21   Alk Phosphatase 44 - 121 IU/L 113 65 99  Total Bilirubin 0.0 - 1.2 mg/dL <0.2 0.7 0.2  Bilirubin, Direct 0.0 - 0.3 mg/dL - - -     Lab Results  Component Value Date/Time   TSH 1.29 06/29/2020 09:16 AM   TSH 1.14 08/14/2018 09:48 AM   FREET4 1.1 06/29/2020 09:16 AM   FREET4 1.26 12/28/2010 06:01 PM    CBC Latest Ref Rng & Units 12/27/2020 03/24/2020 05/16/2017  WBC 4.0 - 10.5 K/uL 8.7 5.9 5.9  Hemoglobin 12.0 - 15.0 g/dL 12.7 12.8 12.9  Hematocrit 36.0 - 46.0 % 40.5 39.3 40.1  Platelets 150 - 400 K/uL 235 239 331    Lab Results  Component Value Date/Time   VD25OH 35 08/14/2018 09:48 AM   VD25OH 47.5 08/29/2017 09:09 AM    Clinical ASCVD: No  The ASCVD Risk score (Arnett DK, et al., 2019) failed to calculate for the following reasons:   The 2019 ASCVD risk score is only valid for ages 66 to 23    Social History   Tobacco Use  Smoking Status Never  Smokeless Tobacco Never   BP Readings from Last 3 Encounters:  05/27/21 (!) 150/75  04/17/21 138/70  03/15/21 (!) 152/66   Pulse Readings from Last 3 Encounters:  05/27/21 70  04/14/21 73  03/15/21 68   Wt Readings from Last 3 Encounters:  05/27/21 127 lb (57.6 kg)  04/14/21 125 lb 1.9 oz (56.8 kg)  03/15/21 129 lb 9.6 oz (58.8 kg)    Assessment: Review of patient past  medical history, allergies, medications, health status, including review of consultants reports, laboratory and other test data, was performed as part of comprehensive evaluation and provision of chronic care management services.   SDOH:  (Social Determinants of Health) assessments and interventions performed:    CCM Care Plan  Allergies  Allergen Reactions   Benadryl [Diphenhydramine Hcl] Hypertension   Citalopram    Metformin And Related Diarrhea    Lost appetite and weight    Tramadol Other (See Comments)    Felt light headed and dizzy   Crestor [Rosuvastatin] Other (See Comments)    "Feet swelling"    Medications Reviewed Today     Reviewed by Beryle Lathe, [facility] (Pharmacist) on 06/16/21 at 1604  Med List Status: <None>   Medication Order Taking? Sig  Documenting Provider Last Dose Status Informant  acetaminophen (TYLENOL) 500 MG tablet [id number] No Take one tablet two times daily for 3 days, then as needed  Patient not taking: Reported on 06/16/2021   [name], MD Not Taking Active   alendronate (FOSAMAX) 70 MG tablet [id number] Yes TAKE 1 TABLET BY MOUTH EVERY 7 DAYS. TAKE WITH A FULL GLASS OF WATER ON AN EMPTY STOMACH [name], MD Taking Active   amLODipine (NORVASC) 10 MG tablet [id number] Yes TAKE 1 TABLET BY MOUTH EVERY DAY [name], MD Taking Active   amLODipine (NORVASC) 2.5 MG tablet [id number] Yes Take one tablet every evening by mouth at 10:30 pm [name], MD Taking Active   aspirin EC 81 MG tablet [id number] No Take 81 mg by mouth daily.  Patient not taking: Reported on 06/16/2021   [provider] Not Taking Active   blood glucose meter kit and supplies KIT [id number]  One touch Ultra. Use three times daily as directed. (FOR ICD E11.65) Cassandria Anger, MD  Active   cetirizine (ZYRTEC) 10 MG tablet [id number] Yes Take 10 mg by mouth daily. [provider] Taking Active   cholecalciferol (VITAMIN D3) 25 MCG (1000 UT) tablet [id number] Yes Take 2,000 Units by mouth daily. [provider] Taking Active   glucose blood (ONETOUCH ULTRA) test strip [id number]  USE TO TEST 3 TIMES A DAY [name], [name], MD  Active   insulin lispro protamine-lispro (HUMALOG MIX 75/25) (75-25) 100 UNIT/ML SUSP injection [id number] Yes INJECT SUBCUTANEOUSLY 10  UNITS TWICE DAILY  Patient taking differently: INJECT SUBCUTANEOUSLY 15  UNITS in AM 10 unit at [employer], Juanetta Beets, NP Taking Active            Med Note Waldo Laine, [place] Jun 16, 2021  3:18 PM) Takes 10-15 units units depending on blood glucose. Patient reports that if her blood glucose is >200 then she uses 12-15 units but ig blood glucose in the 100s only uses 10 units  Insulin Pen Needle 32G X 4 MM MISC  [id number]  1 each by Does not apply route as directed. Cassandria Anger, MD  Active   Insulin Syringe-Needle U-100 (BD INSULIN SYRINGE U/F) 31G X 5/16" 0.5 ML MISC [id number]  USE AS DIRECTED TWICE DAILY Cassandria Anger, MD  Active   KLOR-CON M20 20 MEQ tablet [id number] Yes TAKE 1 TABLET BY MOUTH TWICE A DAY [name], MD Taking Active   losartan (COZAAR) 100 MG tablet [id number] Yes TAKE 1 TABLET BY MOUTH EVERY DAY [name], MD Taking Active   metoprolol tartrate (LOPRESSOR) 50 MG tablet [id number] Yes TAKE 1 TABLET  BY MOUTH TWICE A DAY [name], MD Taking Active   Multiple Vitamins-Minerals (CENTRUM SILVER 50+WOMEN) TABS [id number] Yes Take 1 tablet by mouth daily. [provider] Taking Active Self  mupirocin ointment (BACTROBAN) 2 % [id number] Yes Apply topically 2 (two) times daily. [name], MD Taking Active   OneTouch Delica Lancets 37S MISC [id number]  1 each by Does not apply route 3 (three) times daily. Use to check glucose three times daily Brita Romp, NP  Active   RESTASIS 0.05 % ophthalmic emulsion [id number] Yes  [provider] Taking Active   rosuvastatin (CRESTOR) 5 MG tablet [id number] No Take 1 tablet (5 mg total) by mouth daily.  Patient not taking: Reported on 06/16/2021   [name], MD Not Taking Active   spironolactone (ALDACTONE) 25 MG tablet [id number] Yes TAKE 1 TABLET BY MOUTH EVERY DAY [name], MD Taking Active             Patient Active Problem List   Diagnosis Date Noted   Syncope and collapse 01/13/2021   Lip swelling 10/30/2020   Right leg pain 10/30/2020   Tubular adenoma of colon 10/26/2017   Osteoporosis 07/28/2014   Diabetic hypertension-nephrosis syndrome ([place]) 03/23/2014   Stage 3b chronic kidney disease ([place]) 03/23/2014   Type 2 diabetes mellitus with stage 3a chronic kidney disease, with long-term current use of insulin ([place]) 09/29/2013   Allergic rhinitis  01/27/2013   GERD (gastroesophageal reflux disease) 01/12/2013   CVD (cardiovascular disease) 07/17/2011   Mixed hyperlipidemia 07/15/2007   Essential hypertension 07/15/2007    Immunization History  Administered Date(s) Administered   Fluad Quad(high Dose 65+) 06/02/2019, 05/27/2020, 05/27/2021   Influenza Split 07/17/2011   Influenza Whole 06/18/2007, 07/29/2009, 05/16/2013   Influenza, High Dose Seasonal PF 08/13/2018   Influenza,inj,Quad PF,6+ Mos 05/19/2014, 08/22/2016, 05/17/2017   Moderna Sars-Covid-2 Vaccination 10/26/2019, 11/26/2019, 07/29/2020   Pneumococcal Conjugate-13 03/24/2014   Pneumococcal Polysaccharide-23 05/20/2008   Td 06/18/2002   Tdap 07/17/2011, 12/28/2020    Conditions to be addressed/monitored: HTN, HLD, DMII, CKD Stage 3b, and osteoporosis  Care Plan : Medication Management  Updates made by Beryle Lathe, [place] since 06/16/2021 12:00 AM     Problem: HTN, T2DM, Osteoporosis, HLD, CKD   Priority: High  Onset Date: 06/16/2021     Long-Range Goal: Disease Progression Prevention   Start Date: 06/16/2021  Expected End Date: 09/14/2021  This Visit's Progress: On track  Priority: High  Note:   Current Barriers:  Unable to independently monitor therapeutic efficacy Unable to achieve control of diabetes and hypertension  Pharmacist Clinical Goal(s):  Over the next 90 days, patient will Achieve adherence to monitoring guidelines and medication adherence to achieve therapeutic efficacy Achieve control of diabetes and hypertension as evidenced by improved fasting blood sugar, improved A1c, and improved blood pressure control through collaboration with PharmD and provider.   Interventions: 1:1 collaboration with [name], MD regarding development and update of comprehensive plan of care as evidenced by provider attestation and co-signature Inter-disciplinary care team collaboration (see longitudinal plan of care) Comprehensive medication  review performed; medication list updated in electronic medical record  Type 2 Diabetes - followed by endocrinology Dr. Dorris Fetch and [name]: Uncontrolled; Most recent A1c above goal of <7% per ADA guidelines Current medications: Humalog Mix 75/25 10-15 units subcutaneously before breakfast and dinner Intolerances:  metformin (diarrhea) Taking medications as directed: no, patient is prescribed 10 units subcutaneously twice daily before meals; however, patient correcting for  hyperglycemia and reports taking 12-15 units if blood glucose elevated in the 200s  Side effects thought to be attributed to current medication regimen: yes, patient reports hypoglyecmia 2-3 times per week and had a hypoglycemia event back in April 2022 that caused the patient to fall and fracture her wrist Hypoglycemia prevention: none but will recommend Current meal patterns:  patient reports that she does not eat very much; small portions. Mostly only likes to eat 2 meals per day but since she is on insulin feels like she has to eat again in the afternoon or her blood glucose will drop too low Current exercise: not discussed today On a statin: yes On aspirin 81 mg daily: was taking 325 mg but recommended to decrease dose to 81 mg Last albumin/cr ration: 1090; on an ACEi/ARB: yes Pneumonia vaccine: series complete Influenza vaccine: up to date Current glucose readings:  patient does not have logs or OneTouch meter with her today. Verbally reports that her blood glucose mostly fluctuates between 90-200s fasting and pre-prandial but blood glucose is 50-100s at bedtime Continue Humalog Mix 75/25 10 units subcutaneously before breakfast and dinner Check c-peptide Instructed to monitor blood sugars three times a day at the following times: 5-15 minutes before breakfast, 5-15 minutes before dinner, bedtime, and whenever patient experiences symptoms of hypo/hyperglycemia  Consider decreasing insulin requirements given frequent  hypoglycemia. Will send message to endocrinology. If patient does not have contraindications to GLP-1 agonist therapy and has sufficient endogenous insulin production then patient may benefit from a once weekly GLP-1 agonist rather than insulin therapy given high risk of hypoglycemia. Can discuss affordability options with patient if that is a barrier. Patient given blood glucose log in clinic today. Instructed to fill out and return to follow-up with log and blood glucose meter  Hypertension: Blood pressure under poor control. Blood pressure is above goal of <130/80 mmHg per 2017 AHA/ACC guidelines. Current medications: losartan 100 mg by mouth every evening, amlodipine 10 mg by mouth every morning and 2.5 mg by mouth every evening, spironolactone 25 mg by mouth once daily, and metoprolol tartrate 50 mg by mouth twice daily Intolerances:  to be determined; patient is complaining of swelling in her feet. Amlodipine the likely culprit Taking medications as directed: yes, patient reports improved adherence recently. Side effects thought to be attributed to current medication regimen:  possible peripheral edema from amlodipine Current home blood pressure: patient has a machine at home but is not checking  Encourage dietary sodium restriction/DASH diet Recommend regular aerobic exercise Discussed need for medication compliance Reviewed risks of hypertension, principles of treatment and consequences of untreated hypertension Patient given blood pressure log in clinic today and instructed to check blood pressure daily and document   In an effort to simplify regimen for PCP referral, I would recommend the following changes:  Decrease amlodipine to 10 mg by mouth daily. Patient reports peripheral edema which is most likely associated with high dose of amlodipine. May need to lower even further but can see if edema improves after lowering amlodipine to 10 mg daily To improve adherence, would recommend  combining amlodipine with ARB. Recommend switching from amlodipine and losartan to Azor (amlodipine-olmesartan 10-40 mg by mouth daily) Consider switching beta blocker therapy from metoprolol tartrate to carvedilol given alpha-1 blocking properties making it a more effective blood pressure lowering beta blocker. Equivalent dose conversion would be carvedilol 12.5 mg by mouth twice daily  Continue spironolactone 25 mg by mouth daily Given current renal function, additional blood pressure  lowering options limited to hydralazine if additional blood pressure lowering therapy is needed once the above regimen is simplified and patient improves adherence   Hyperlipidemia: Controlled. LDL at goal of <70 due to very high risk given diabetes + at least 1 additional major risk factor (hypertension, chronic kidney disease (CKD), and family history of ASCVD) per 2020 AACE/ACE guidelines. Triglycerides at goal of <150 per 2020 AACE/ACE guidelines. Current medications: rosuvastatin 5 mg by mouth once daily Intolerances:  rosuvastatin (patient reports she thinks this causes swelling of her feet; more likely to be associated with amlodipine Taking medications as directed: no, patient reports that she has stopped taking rosuvastatin and does not want to take it anymore Side effects thought to be attributed to current medication regimen:  patient reports she thinks her feet were swelling from rosuvastatin Encourage dietary reduction of high fat containing foods such as butter, nuts, bacon, egg yolks, etc. Recommend regular aerobic exercise Reviewed risks of hyperlipidemia, principles of treatment and consequences of untreated hyperlipidemia Discussed need for medication compliance Re-check lipid panel in 4-12 weeks Consider switching from rosuvastatin to atorvastatin 10 mg by mouth daily  Chronic Kidney Disease Stage 3b - GFR 30-44 (Moderate to Severely Reduced Function): Appropriately managed Current medications:  losartan 100 mg by mouth once daily, spironolactone 25 mg by mouth once daily, and rosuvastatin 5 mg by mouth once daily Most recent GFR: 29 mL/min Most recent albumin/cr ratio: 1090 on ARB Encouraged healthy diet including more fruits and vegetables Recommend adequate hydration  Discussed need for medication compliance Discussed need for improved control of blood pressure Discussed need for improved control of blood sugar Continue ARB, MRA, and statin  Osteoporosis: Suboptimally managed Current medications: alendronate (Fosamax) 70 mg by mouth once weekly and vitamin D3 2,000 units by mouth daily Intolerances: none Taking medications as directed: yes Side effects thought to be attributed to current medication regimen: no Last DEXA unknown Falls reported in last year: 1 that lead to wrist fracture in April 2022 Continue alendronate (Fosamax) 70 mg by mouth once weekly and vitamin D3 2,000 units by mouth daily Recommend 1,000 mg of elemental calcium per day from diet or supplementation. Supplemental calcium not necessary if appropriate amount obtained through diet. Recommend walking, low impact aerobic exercise, or strength training Consider bisphosphonate holiday since patient has been on therapy since 2015. May need to consider switching to Prolia.   Patient Goals/Self-Care Activities Over the next 90 days, patient will:  Focus on medication adherence by keeping up with prescription refills and either using a pill box or reminders to take your medications at the prescribed times Check blood sugar three times a day at the following times: 5-15 minutes before breakfast, 5-15 minutes before dinner, bedtime, and whenever patient experiences symptoms of hypo/hyperglycemia, document, and provide at future appointments Check blood pressure at least once daily, document, and provide at future appointments  Follow Up Plan: Face to Face appointment with care management team member scheduled for:  07/07/21      Medication Assistance: None required.  Patient affirms current coverage meets needs.  Patient's preferred pharmacy is:  [id number] [place]Phone: [phone]: [address] [address]Phone: [phone]: [phone] [facility]  [address]  [place] [address] Phone: [phone] Fax: [phone]  Follow Up:  Patient agrees to Care Plan and Follow-up.  Plan: Face to Face appointment with care management team member scheduled for: 07/07/21  Kennon Holter, PharmD Clinical Pharmacist [facility] [phone]

## 2021-06-17 ENCOUNTER — Other Ambulatory Visit: Payer: Self-pay | Admitting: Family Medicine

## 2021-06-17 DIAGNOSIS — E782 Mixed hyperlipidemia: Secondary | ICD-10-CM | POA: Diagnosis not present

## 2021-06-17 DIAGNOSIS — M810 Age-related osteoporosis without current pathological fracture: Secondary | ICD-10-CM | POA: Diagnosis not present

## 2021-06-17 DIAGNOSIS — E1122 Type 2 diabetes mellitus with diabetic chronic kidney disease: Secondary | ICD-10-CM

## 2021-06-17 DIAGNOSIS — Z794 Long term (current) use of insulin: Secondary | ICD-10-CM | POA: Diagnosis not present

## 2021-06-17 DIAGNOSIS — N1831 Chronic kidney disease, stage 3a: Secondary | ICD-10-CM

## 2021-06-17 DIAGNOSIS — I10 Essential (primary) hypertension: Secondary | ICD-10-CM

## 2021-06-20 ENCOUNTER — Telehealth: Payer: Self-pay | Admitting: Family Medicine

## 2021-06-20 NOTE — Telephone Encounter (Signed)
Patient called saying that she spoke with Pharmacy around about Thursday and doesn't agree with what was discussed, wants to speak with Nurse / Dr. Moshe Cipro.

## 2021-06-21 NOTE — Telephone Encounter (Signed)
Left vm to bring all meds to her next appt for review

## 2021-06-24 ENCOUNTER — Ambulatory Visit (INDEPENDENT_AMBULATORY_CARE_PROVIDER_SITE_OTHER): Payer: Medicare Other | Admitting: Family Medicine

## 2021-06-24 ENCOUNTER — Encounter: Payer: Self-pay | Admitting: Family Medicine

## 2021-06-24 ENCOUNTER — Other Ambulatory Visit: Payer: Self-pay

## 2021-06-24 VITALS — BP 165/74 | HR 68 | Resp 16 | Ht 61.0 in | Wt 129.0 lb

## 2021-06-24 DIAGNOSIS — E1122 Type 2 diabetes mellitus with diabetic chronic kidney disease: Secondary | ICD-10-CM | POA: Diagnosis not present

## 2021-06-24 DIAGNOSIS — Z79899 Other long term (current) drug therapy: Secondary | ICD-10-CM | POA: Diagnosis not present

## 2021-06-24 DIAGNOSIS — I10 Essential (primary) hypertension: Secondary | ICD-10-CM

## 2021-06-24 DIAGNOSIS — Z794 Long term (current) use of insulin: Secondary | ICD-10-CM

## 2021-06-24 DIAGNOSIS — E782 Mixed hyperlipidemia: Secondary | ICD-10-CM | POA: Diagnosis not present

## 2021-06-24 DIAGNOSIS — N1831 Chronic kidney disease, stage 3a: Secondary | ICD-10-CM

## 2021-06-24 MED ORDER — CARVEDILOL 12.5 MG PO TABS: 12.5000 mg | ORAL_TABLET | Freq: Two times a day (BID) | ORAL | 3 refills | Status: DC

## 2021-06-24 MED ORDER — AMLODIPINE-OLMESARTAN 10-40 MG PO TABS: 1.0000 | ORAL_TABLET | Freq: Every day | ORAL | 3 refills | Status: DC

## 2021-06-24 NOTE — Assessment & Plan Note (Signed)
Uncontrolled, med changes maxe DASH diet and commitment to daily physical activity for a minimum of 30 minutes discussed and encouraged, as a part of hypertension management. The importance of attaining a healthy weight is also discussed.  BP/Weight 06/24/2021 05/27/2021 04/14/2021 03/15/2021 03/10/2021 03/09/2021 4/47/3958  Systolic BP 441 712 787 183 672 - -  Diastolic BP 74 75 70 66 78 - -  Wt. (Lbs) 129 127 125.12 129.6 127.8 126 126  BMI 24.37 24 23.64 24.49 24.15 23.81 23.81

## 2021-06-24 NOTE — Assessment & Plan Note (Signed)
Reviewed antihypertensiove meds, changes to be implemented and the reason, she is in agreement and this is done Holding off on alternate statin now , but will call if she decides to try one before return visit

## 2021-06-24 NOTE — Patient Instructions (Signed)
Follow-up in December as before call if you need me sooner.  Nurse please review these medication changes again with Emily Jennings at checkout   New  for blood pressure is olmesartan 10-40 one tablet daily.   Stop amlodipine 10 mg  Stop amlodipine 2.5 mg  Stop losartan 100 mg. New is carvedilol 12.5 mg 1 tablet twice daily.   Stop metoprolol 50 mg twice daily.  Since you  report a lots of weakness and swelling with the Crestor you may stop this for now.   As we discussed this type of medicine  does reduce the risk of heart disease and stroke in all diabetics.   I am asking you to consider another similar medication and even taking it 2 to 3 days/week will have benefit.   Please call if you decide to try this before your next visit and I will change to a different but similar type of medicine and have prescribed.  Most likely atorvastatin 10 mg 2-3 times weekly.  It is important that you exercise regularly at least 30 minutes 5 times a week. If you develop chest pain, have severe difficulty breathing, or feel very tired, stop exercising immediately and seek medical attention  Think about what you will eat, plan ahead. Choose " clean, green, fresh or frozen" over canned, processed or packaged foods which are more sugary, salty and fatty. 70 to 75% of food eaten should be vegetables and fruit. Three meals at set times with snacks allowed between meals, but they must be fruit or vegetables. Aim to eat over a 12 hour period , example 7 am to 7 pm, and STOP after  your last meal of the day. Drink water,generally about 64 ounces per day, no other drink is as healthy. Fruit juice is best enjoyed in a healthy way, by EATING the fruit. Thanks for choosing [facility], we consider it a privelige to serve you.

## 2021-06-24 NOTE — Progress Notes (Signed)
Emily Jennings     MRN: [id number]      DOB: August 28, 1934   HPI Emily Jennings is here for follow up and re-evaluation of chronic medical conditions, and specifically medication management this in conjunction with recent pharmacy evall and recommendations Does not want statin at this time despite re education of the value and importance , will make antihypertensive changes as recommended, has stopped anmlodipine 2.5 mg and crestor otherwise was taking meds recommended   ROS Denies recent fever or chills. Denies sinus pressure, nasal congestion, ear pain or sore throat. Denies chest congestion, productive cough or wheezing. Denies chest pains, palpitations and leg swelling Denies abdominal pain, nausea, vomiting,diarrhea or constipation.   Denies dysuria, frequency, hesitancy or incontinence. Denies joint pain, swelling and limitation in mobility. Denies headaches, seizures, numbness, or tingling. Denies depression, anxiety or insomnia. Denies skin break down or rash.   PE  BP (!) 165/74   Pulse 68   Resp 16   Ht 5\' 1"  (1.549 m)   Wt 129 lb (58.5 kg)   SpO2 96%   BMI 24.37 kg/m   Patient alert and oriented and in no cardiopulmonary distress.  HEENT: No facial asymmetry, EOMI,     Neck supple .  Chest: Clear to auscultation bilaterally.  CVS: S1, S2 no murmurs, no S3.Regular rate.  ABD: Soft non tender.   Ext: No edema  MS: Adequate ROM spine, shoulders, hips and knees.  Skin: Intact, no ulcerations or rash noted.  Psych: Good eye contact, normal affect. Memory intact not anxious or depressed appearing.  CNS: CN 2-12 intact, power,  normal throughout.no focal deficits noted.   Assessment & Plan  Encounter for medication management Reviewed antihypertensiove meds, changes to be implemented and the reason, she is in agreement and this is done Holding off on alternate statin now , but will call if she decides to try one before return visit  Essential  hypertension Uncontrolled, med changes maxe DASH diet and commitment to daily physical activity for a minimum of 30 minutes discussed and encouraged, as a part of hypertension management. The importance of attaining a healthy weight is also discussed.  BP/Weight 06/24/2021 05/27/2021 04/14/2021 03/15/2021 03/10/2021 03/09/2021 6/83/4196  Systolic BP 222 979 892 119 417 - -  Diastolic BP 74 75 70 66 78 - -  Wt. (Lbs) 129 127 125.12 129.6 127.8 126 126  BMI 24.37 24 23.64 24.49 24.15 23.81 23.81       Mixed hyperlipidemia Hesitant to start alternate statin at this time, re educated re the value of statin therapy, she will call back   Type 2 diabetes mellitus with stage 3a chronic kidney disease, with long-term current use of insulin ([place]) Emily Jennings is reminded of the importance of commitment to daily physical activity for 30 minutes or more, as able and the need to limit carbohydrate intake to 30 to 60 grams per meal to help with blood sugar control.   The need to take medication as prescribed, test blood sugar as directed, and to call between visits if there is a concern that blood sugar is uncontrolled is also discussed.   Emily Jennings is reminded of the importance of daily foot exam, annual eye examination, and good blood sugar, blood pressure and cholesterol control. manged by Endo  Diabetic Labs Latest Ref Rng & Units 05/24/2021 03/15/2021 01/04/2021 12/27/2020 12/07/2020  HbA1c 0.0 - 7.0 % - 6.9 - - -  Microalbumin mg/L - - - - -  Micro/Creat Ratio <30 mcg/mg creat - - - - -  Chol 100 - 199 mg/dL 193 - - - -  HDL >39 mg/dL 116 - - - -  Calc LDL 0 - 99 mg/dL 69 - - - -  Triglycerides 0 - 149 mg/dL 38 - - - -  Creatinine 0.57 - 1.00 mg/dL 1.71(H) - 1.46(H) 1.34(H) 1.30(H)   BP/Weight 06/24/2021 05/27/2021 04/14/2021 03/15/2021 03/10/2021 03/09/2021 4/92/0100  Systolic BP 712 197 588 325 498 - -  Diastolic BP 74 75 70 66 78 - -  Wt. (Lbs) 129 127 125.12 129.6 127.8 126 126  BMI 24.37 24 23.64 24.49  24.15 23.81 23.81   Foot/eye exam completion dates Latest Ref Rng & Units 11/10/2020 11/10/2020  Eye Exam No Retinopathy No Retinopathy No Retinopathy  Foot Form Completion - - -

## 2021-06-24 NOTE — Assessment & Plan Note (Signed)
Emily Jennings is reminded of the importance of commitment to daily physical activity for 30 minutes or more, as able and the need to limit carbohydrate intake to 30 to 60 grams per meal to help with blood sugar control.   The need to take medication as prescribed, test blood sugar as directed, and to call between visits if there is a concern that blood sugar is uncontrolled is also discussed.   Emily Jennings is reminded of the importance of daily foot exam, annual eye examination, and good blood sugar, blood pressure and cholesterol control. manged by Endo  Diabetic Labs Latest Ref Rng & Units 05/24/2021 03/15/2021 01/04/2021 12/27/2020 12/07/2020  HbA1c 0.0 - 7.0 % - 6.9 - - -  Microalbumin mg/L - - - - -  Micro/Creat Ratio <30 mcg/mg creat - - - - -  Chol 100 - 199 mg/dL 193 - - - -  HDL >39 mg/dL 116 - - - -  Calc LDL 0 - 99 mg/dL 69 - - - -  Triglycerides 0 - 149 mg/dL 38 - - - -  Creatinine 0.57 - 1.00 mg/dL 1.71(H) - 1.46(H) 1.34(H) 1.30(H)   BP/Weight 06/24/2021 05/27/2021 04/14/2021 03/15/2021 03/10/2021 03/09/2021 12/10/4980  Systolic BP 641 583 094 076 808 - -  Diastolic BP 74 75 70 66 78 - -  Wt. (Lbs) 129 127 125.12 129.6 127.8 126 126  BMI 24.37 24 23.64 24.49 24.15 23.81 23.81   Foot/eye exam completion dates Latest Ref Rng & Units 11/10/2020 11/10/2020  Eye Exam No Retinopathy No Retinopathy No Retinopathy  Foot Form Completion - - -

## 2021-06-24 NOTE — Assessment & Plan Note (Signed)
Hesitant to start alternate statin at this time, re educated re the value of statin therapy, she will call back

## 2021-06-30 ENCOUNTER — Other Ambulatory Visit: Payer: Self-pay | Admitting: Family Medicine

## 2021-07-07 ENCOUNTER — Ambulatory Visit: Payer: Medicare Other

## 2021-07-11 ENCOUNTER — Other Ambulatory Visit: Payer: Self-pay | Admitting: Family Medicine

## 2021-07-12 ENCOUNTER — Ambulatory Visit: Payer: Medicare Other

## 2021-07-12 ENCOUNTER — Other Ambulatory Visit: Payer: Self-pay

## 2021-07-12 ENCOUNTER — Encounter (INDEPENDENT_AMBULATORY_CARE_PROVIDER_SITE_OTHER): Payer: Self-pay

## 2021-07-12 DIAGNOSIS — Z20822 Contact with and (suspected) exposure to covid-19: Secondary | ICD-10-CM | POA: Diagnosis not present

## 2021-07-14 ENCOUNTER — Ambulatory Visit (INDEPENDENT_AMBULATORY_CARE_PROVIDER_SITE_OTHER): Payer: Medicare Other | Admitting: Family Medicine

## 2021-07-14 ENCOUNTER — Encounter: Payer: Self-pay | Admitting: Family Medicine

## 2021-07-14 ENCOUNTER — Encounter (INDEPENDENT_AMBULATORY_CARE_PROVIDER_SITE_OTHER): Payer: Self-pay

## 2021-07-14 ENCOUNTER — Other Ambulatory Visit: Payer: Self-pay

## 2021-07-14 DIAGNOSIS — J0100 Acute maxillary sinusitis, unspecified: Secondary | ICD-10-CM

## 2021-07-14 DIAGNOSIS — J209 Acute bronchitis, unspecified: Secondary | ICD-10-CM

## 2021-07-14 LAB — NOVEL CORONAVIRUS, NAA: SARS-CoV-2, NAA: NOT DETECTED

## 2021-07-14 LAB — SARS-COV-2, NAA 2 DAY TAT

## 2021-07-14 MED ORDER — BENZONATATE 100 MG PO CAPS: 100.0000 mg | ORAL_CAPSULE | Freq: Two times a day (BID) | ORAL | 0 refills | Status: DC | PRN

## 2021-07-14 MED ORDER — AZITHROMYCIN 250 MG PO TABS: ORAL_TABLET | ORAL | 0 refills | Status: AC

## 2021-07-14 NOTE — Progress Notes (Signed)
Virtual Visit via Telephone Note  I connected with Emily Jennings on 07/14/21 at  1:20 PM EDT by telephone and verified that I am speaking with the correct person using two identifiers.  Location: Patient: home Provider: office   I discussed the limitations, risks, security and privacy concerns of performing an evaluation and management service by telephone and the availability of in person appointments. I also discussed with the patient that there may be a patient responsible charge related to this service. The patient expressed understanding and agreed to proceed.   History of Present Illness: Runny nose, sneeze, cough since 07/02/2021, thinks she had low grade fever  on 10/15 and 16, really had  loss of appetie no documented fever Cough, sputum which is yellow C/o facial pressure over cheeks Negative covid test this week   Observations/Objective: There were no vitals taken for this visit. Good communication with no confusion and intact memory. Alert and oriented x 3 Head  congestion, and cough heard  during visit Assessment and Plan: Acute bronchitis Tessalon perle and Z pack prescribed  Sinusitis Z pack  prescribed   Follow Up Instructions:    I discussed the assessment and treatment plan with the patient. The patient was provided an opportunity to ask questions and all were answered. The patient agreed with the plan and demonstrated an understanding of the instructions.   The patient was advised to call back or seek an in-person evaluation if the symptoms worsen or if the condition fails to improve as anticipated.  I provided  8 minutes of non-face-to-face time during this encounter.   Tula Nakayama, MD

## 2021-07-14 NOTE — Patient Instructions (Addendum)
F/U as before, call if you  need me before  Azithromycin and tessalon perles are prescribed for your cough and sinus congestion  Your covid test is NEGATIVE  Please get non fasting labs ro reevaluate your kidney function in the next 2 weeks

## 2021-07-17 ENCOUNTER — Encounter: Payer: Self-pay | Admitting: Family Medicine

## 2021-07-17 NOTE — Assessment & Plan Note (Signed)
Tessalon perle and Z pack prescribed ?

## 2021-07-17 NOTE — Assessment & Plan Note (Signed)
Z pack prescribed 

## 2021-07-25 ENCOUNTER — Telehealth: Payer: Self-pay | Admitting: Family Medicine

## 2021-07-25 NOTE — Telephone Encounter (Signed)
Called pt back. LVM to return call

## 2021-07-25 NOTE — Telephone Encounter (Signed)
Pt states that she started taking the blood pressure per the Dr and her BP is to high now 99\87.   Please call the pt

## 2021-07-25 NOTE — Telephone Encounter (Signed)
Needs in office bp check this week

## 2021-07-26 NOTE — Telephone Encounter (Signed)
Appt made

## 2021-07-28 ENCOUNTER — Other Ambulatory Visit: Payer: Self-pay

## 2021-07-28 ENCOUNTER — Ambulatory Visit (INDEPENDENT_AMBULATORY_CARE_PROVIDER_SITE_OTHER): Payer: Medicare Other | Admitting: Family Medicine

## 2021-07-28 ENCOUNTER — Telehealth: Payer: Self-pay

## 2021-07-28 ENCOUNTER — Encounter: Payer: Self-pay | Admitting: Family Medicine

## 2021-07-28 VITALS — BP 156/66 | HR 100 | Resp 18 | Ht 61.0 in | Wt 130.1 lb

## 2021-07-28 DIAGNOSIS — E1122 Type 2 diabetes mellitus with diabetic chronic kidney disease: Secondary | ICD-10-CM | POA: Diagnosis not present

## 2021-07-28 DIAGNOSIS — N1831 Chronic kidney disease, stage 3a: Secondary | ICD-10-CM

## 2021-07-28 DIAGNOSIS — Z794 Long term (current) use of insulin: Secondary | ICD-10-CM | POA: Diagnosis not present

## 2021-07-28 DIAGNOSIS — E782 Mixed hyperlipidemia: Secondary | ICD-10-CM | POA: Diagnosis not present

## 2021-07-28 DIAGNOSIS — I10 Essential (primary) hypertension: Secondary | ICD-10-CM | POA: Diagnosis not present

## 2021-07-28 NOTE — Telephone Encounter (Signed)
Patient called said she does not have this medicine at home carvedilol (COREG) 12.5 MG tablet  Pharmacy: [facility] [place]

## 2021-07-28 NOTE — Telephone Encounter (Signed)
Please contact patient to let her know whether or not she should pick up this medicine.

## 2021-07-28 NOTE — Patient Instructions (Addendum)
Keep appointment in December  Blood pressure is improved  Medication for BP is amlodipine/ olmesartan10/40 ONE daily Carvedilol 12.5 mg one TWICE daily Spironolactone 25 mg one daily  Chem 7 and EGFR today  Thanks for choosing [facility], we consider it a privelige to serve you.

## 2021-07-29 ENCOUNTER — Other Ambulatory Visit: Payer: Self-pay

## 2021-07-29 LAB — CMP14+EGFR
ALT: 18 IU/L (ref 0–32)
AST: 29 IU/L (ref 0–40)
Albumin/Globulin Ratio: 1.6 (ref 1.2–2.2)
Albumin: 3.6 g/dL (ref 3.6–4.6)
Alkaline Phosphatase: 89 IU/L (ref 44–121)
BUN/Creatinine Ratio: 15 (ref 12–28)
BUN: 23 mg/dL (ref 8–27)
Bilirubin Total: 0.2 mg/dL (ref 0.0–1.2)
CO2: 26 mmol/L (ref 20–29)
Calcium: 9.2 mg/dL (ref 8.7–10.3)
Chloride: 98 mmol/L (ref 96–106)
Creatinine, Ser: 1.49 mg/dL — ABNORMAL HIGH (ref 0.57–1.00)
Globulin, Total: 2.3 g/dL (ref 1.5–4.5)
Glucose: 161 mg/dL — ABNORMAL HIGH (ref 70–99)
Potassium: 4.4 mmol/L (ref 3.5–5.2)
Sodium: 138 mmol/L (ref 134–144)
Total Protein: 5.9 g/dL — ABNORMAL LOW (ref 6.0–8.5)
eGFR: 34 mL/min/{1.73_m2} — ABNORMAL LOW (ref >59)

## 2021-07-29 LAB — HEMOGLOBIN A1C
Est. average glucose Bld gHb Est-mCnc: 148 mg/dL
Hgb A1c MFr Bld: 6.8 % — ABNORMAL HIGH (ref 4.8–5.6)

## 2021-07-29 MED ORDER — CARVEDILOL 12.5 MG PO TABS: 12.5000 mg | ORAL_TABLET | Freq: Two times a day (BID) | ORAL | 3 refills | Status: DC

## 2021-07-29 NOTE — Telephone Encounter (Signed)
Pt aware to [facility] the [facility] and then stop the metoprolol

## 2021-07-31 ENCOUNTER — Encounter: Payer: Self-pay | Admitting: Family Medicine

## 2021-07-31 NOTE — Assessment & Plan Note (Signed)
Improved, medication adjustment made DASH diet and commitment to daily physical activity for a minimum of 30 minutes discussed and encouraged, as a part of hypertension management. The importance of attaining a healthy weight is also discussed.  BP/Weight 07/28/2021 06/24/2021 05/27/2021 04/14/2021 03/15/2021 03/10/2021 7/89/3810  Systolic BP 175 102 585 277 824 235 -  Diastolic BP 66 74 75 70 66 78 -  Wt. (Lbs) 130.08 129 127 125.12 129.6 127.8 126  BMI 24.58 24.37 24 23.64 24.49 24.15 23.81

## 2021-07-31 NOTE — Progress Notes (Signed)
Emily Jennings     MRN: [id number]      DOB: Apr 16, 1934   HPI Emily Jennings is here for follow up and re-evaluation of uncontrolled hypertension.  Tolerating current medication , no adverse s/e noted   ROS Denies recent fever or chills. Denies sinus pressure, nasal congestion, ear pain or sore throat. Denies chest congestion, productive cough or wheezing. Denies chest pains, palpitations and leg swelling Denies abdominal pain, nausea, vomiting,diarrhea or constipation.   Denies dysuria, frequency, hesitancy or incontinence. Denies joint pain, swelling and limitation in mobility. Denies headaches, seizures, numbness, or tingling. Denies depression, anxiety or insomnia. Denies skin break down or rash.   PE  BP (!) 156/66   Pulse 100   Resp 18   Ht 5\' 1"  (1.549 m)   Wt 130 lb 1.3 oz (59 kg)   SpO2 96%   BMI 24.58 kg/m   Patient alert and oriented and in no cardiopulmonary distress.  HEENT: No facial asymmetry, EOMI,     Neck supple .  Chest: Clear to auscultation bilaterally.  CVS: S1, S2 no murmurs, no S3.Regular rate.  ABD: Soft non tender.   Ext: No edema  MS: Adequate ROM spine, shoulders, hips and knees.  Skin: Intact, no ulcerations or rash noted.  Psych: Good eye contact, normal affect. Memory intact not anxious or depressed appearing.  CNS: CN 2-12 intact, power,  normal throughout.no focal deficits noted.   Assessment & Plan  Essential hypertension Improved, medication adjustment made DASH diet and commitment to daily physical activity for a minimum of 30 minutes discussed and encouraged, as a part of hypertension management. The importance of attaining a healthy weight is also discussed.  BP/Weight 07/28/2021 06/24/2021 05/27/2021 04/14/2021 03/15/2021 03/10/2021 03/16/4764  Systolic BP 465 035 465 681 275 170 -  Diastolic BP 66 74 75 70 66 78 -  Wt. (Lbs) 130.08 129 127 125.12 129.6 127.8 126  BMI 24.58 24.37 24 23.64 24.49 24.15 23.81       Type 2  diabetes mellitus with stage 3a chronic kidney disease, with long-term current use of insulin ([place]) Emily Jennings is reminded of the importance of commitment to daily physical activity for 30 minutes or more, as able and the need to limit carbohydrate intake to 30 to 60 grams per meal to help with blood sugar control.   The need to take medication as prescribed, test blood sugar as directed, and to call between visits if there is a concern that blood sugar is uncontrolled is also discussed.   Emily Jennings is reminded of the importance of daily foot exam, annual eye examination, and good blood sugar, blood pressure and cholesterol control. Controlled, no change in medication   Diabetic Labs Latest Ref Rng & Units 07/28/2021 05/24/2021 03/15/2021 01/04/2021 12/27/2020  HbA1c 4.8 - 5.6 % 6.8(H) - 6.9 - -  Microalbumin mg/L - - - - -  Micro/Creat Ratio <30 mcg/mg creat - - - - -  Chol 100 - 199 mg/dL - 193 - - -  HDL >39 mg/dL - 116 - - -  Calc LDL 0 - 99 mg/dL - 69 - - -  Triglycerides 0 - 149 mg/dL - 38 - - -  Creatinine 0.57 - 1.00 mg/dL 1.49(H) 1.71(H) - 1.46(H) 1.34(H)   BP/Weight 07/28/2021 06/24/2021 05/27/2021 04/14/2021 03/15/2021 03/10/2021 0/17/4944  Systolic BP 967 591 638 466 599 357 -  Diastolic BP 66 74 75 70 66 78 -  Wt. (Lbs) 130.08 129 127 125.12 129.6  127.8 126  BMI 24.58 24.37 24 23.64 24.49 24.15 23.81   Foot/eye exam completion dates Latest Ref Rng & Units 11/10/2020 11/10/2020  Eye Exam No Retinopathy No Retinopathy No Retinopathy  Foot Form Completion - - -

## 2021-07-31 NOTE — Assessment & Plan Note (Signed)
Emily Jennings is reminded of the importance of commitment to daily physical activity for 30 minutes or more, as able and the need to limit carbohydrate intake to 30 to 60 grams per meal to help with blood sugar control.   The need to take medication as prescribed, test blood sugar as directed, and to call between visits if there is a concern that blood sugar is uncontrolled is also discussed.   Emily Jennings is reminded of the importance of daily foot exam, annual eye examination, and good blood sugar, blood pressure and cholesterol control. Controlled, no change in medication   Diabetic Labs Latest Ref Rng & Units 07/28/2021 05/24/2021 03/15/2021 01/04/2021 12/27/2020  HbA1c 4.8 - 5.6 % 6.8(H) - 6.9 - -  Microalbumin mg/L - - - - -  Micro/Creat Ratio <30 mcg/mg creat - - - - -  Chol 100 - 199 mg/dL - 193 - - -  HDL >39 mg/dL - 116 - - -  Calc LDL 0 - 99 mg/dL - 69 - - -  Triglycerides 0 - 149 mg/dL - 38 - - -  Creatinine 0.57 - 1.00 mg/dL 1.49(H) 1.71(H) - 1.46(H) 1.34(H)   BP/Weight 07/28/2021 06/24/2021 05/27/2021 04/14/2021 03/15/2021 03/10/2021 0/93/2671  Systolic BP 245 809 983 382 505 397 -  Diastolic BP 66 74 75 70 66 78 -  Wt. (Lbs) 130.08 129 127 125.12 129.6 127.8 126  BMI 24.58 24.37 24 23.64 24.49 24.15 23.81   Foot/eye exam completion dates Latest Ref Rng & Units 11/10/2020 11/10/2020  Eye Exam No Retinopathy No Retinopathy No Retinopathy  Foot Form Completion - - -

## 2021-08-30 DIAGNOSIS — E119 Type 2 diabetes mellitus without complications: Secondary | ICD-10-CM | POA: Diagnosis not present

## 2021-08-30 DIAGNOSIS — H2511 Age-related nuclear cataract, right eye: Secondary | ICD-10-CM | POA: Diagnosis not present

## 2021-08-30 DIAGNOSIS — H35363 Drusen (degenerative) of macula, bilateral: Secondary | ICD-10-CM | POA: Diagnosis not present

## 2021-08-30 DIAGNOSIS — H401131 Primary open-angle glaucoma, bilateral, mild stage: Secondary | ICD-10-CM | POA: Diagnosis not present

## 2021-09-01 ENCOUNTER — Encounter: Payer: Medicare Other | Admitting: Family Medicine

## 2021-09-02 DIAGNOSIS — H401131 Primary open-angle glaucoma, bilateral, mild stage: Secondary | ICD-10-CM | POA: Diagnosis not present

## 2021-09-17 ENCOUNTER — Other Ambulatory Visit: Payer: Self-pay | Admitting: Family Medicine

## 2021-09-20 ENCOUNTER — Ambulatory Visit: Payer: Medicare Other | Admitting: "Endocrinology

## 2021-09-28 IMAGING — US US CAROTID DUPLEX BILAT
1 series · 13 of 24 positions shown · non-contrast
Comparison: None.

CLINICAL DATA: Lightheaded, syncope

EXAM:
BILATERAL CAROTID DUPLEX ULTRASOUND
TECHNIQUE: Gray scale imaging, color Doppler and duplex ultrasound were
performed of bilateral carotid and vertebral arteries in the neck.

[Series 1: us carotid bilateral · 13 of 68 slices shown]
[im 1/68]
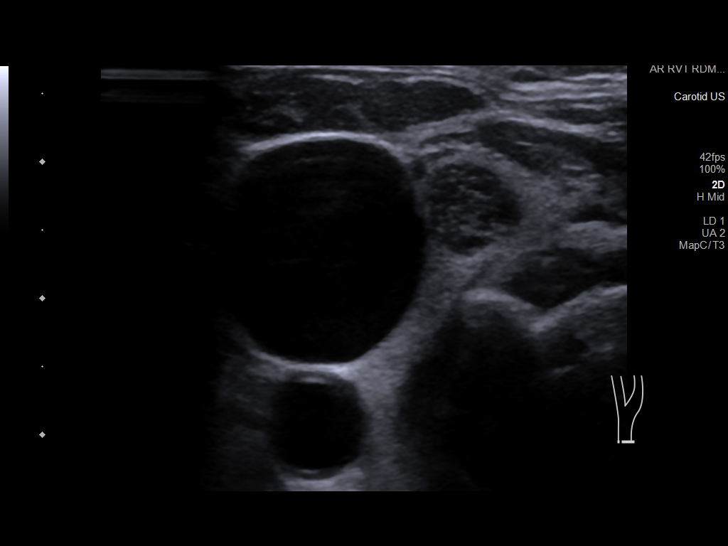
[im 6/68]
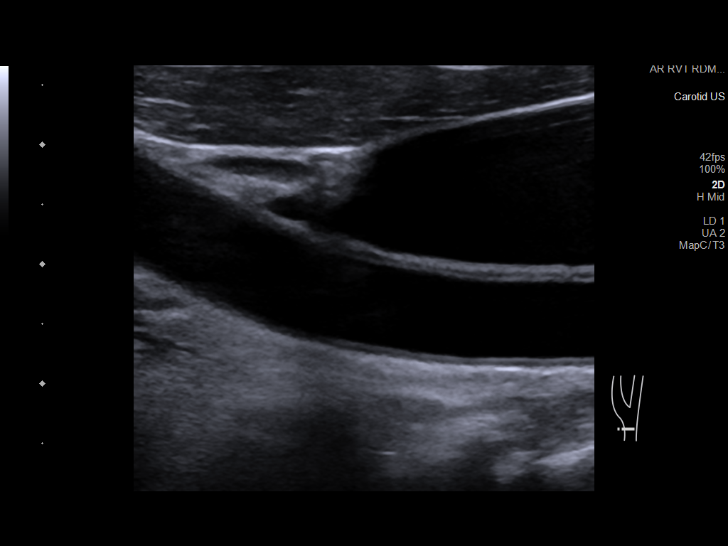
[im 12/68]
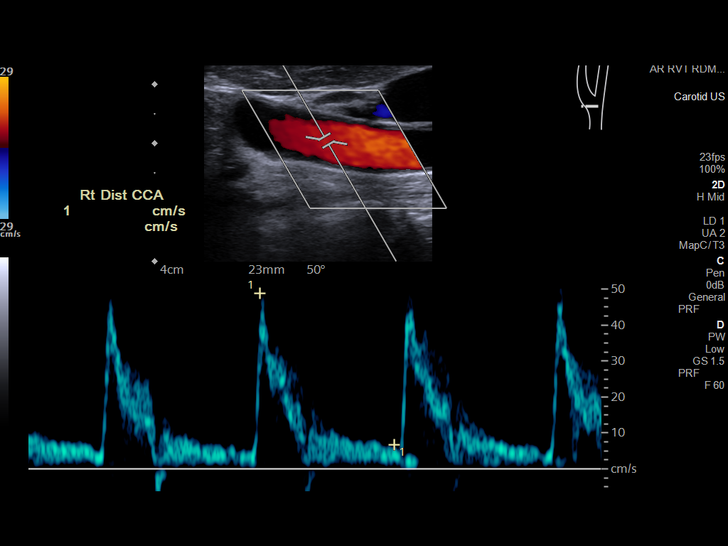
[im 18/68]
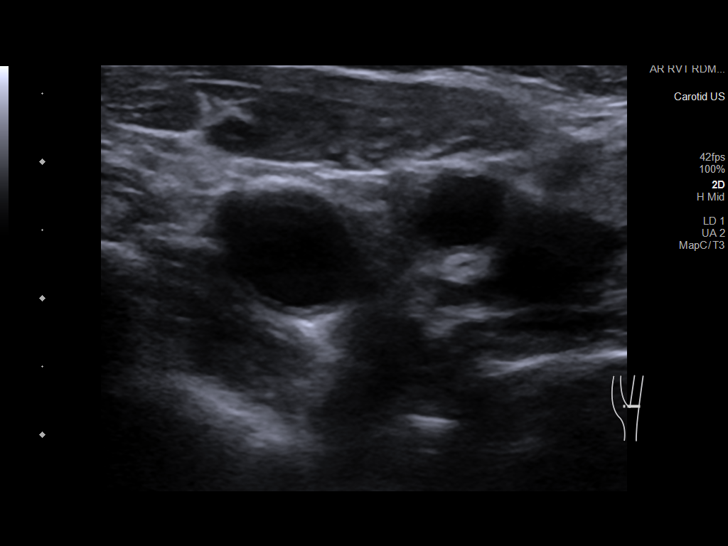
[im 24/68]
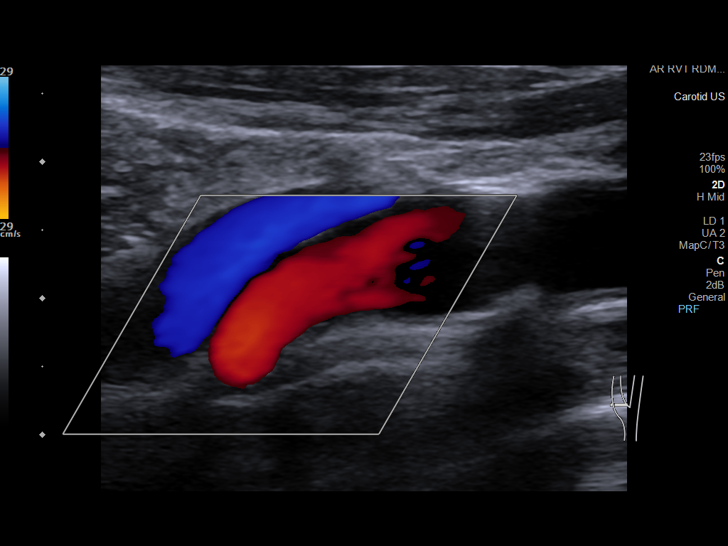
[im 30/68]
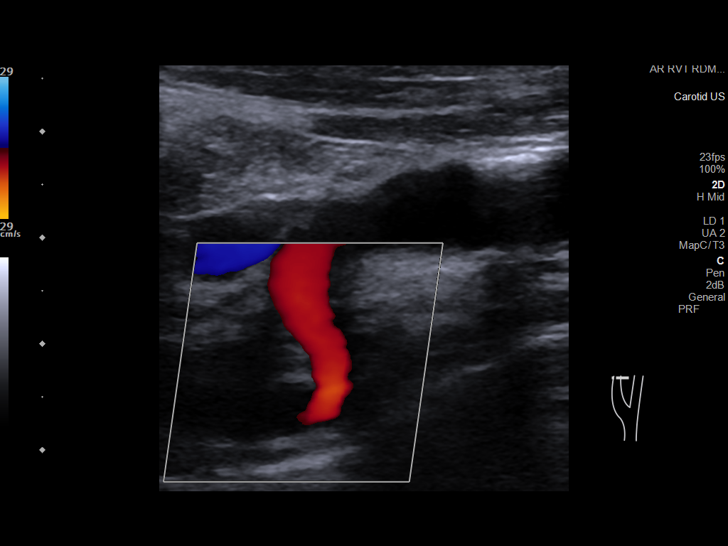
[im 35/68]
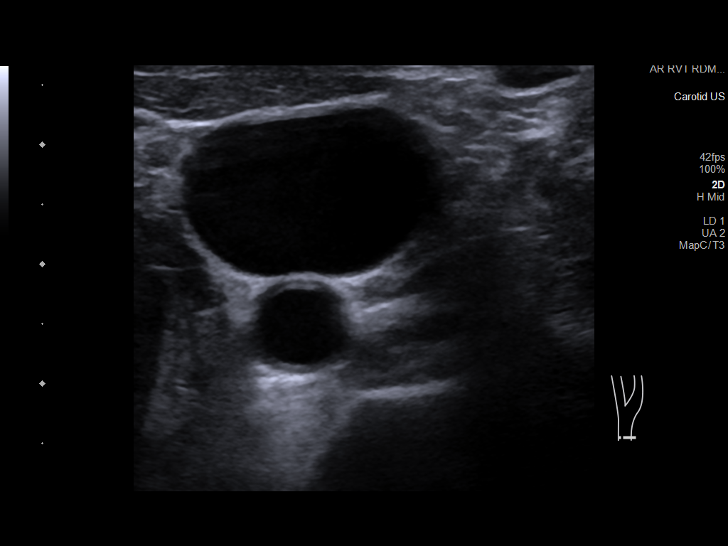
[im 38/68]
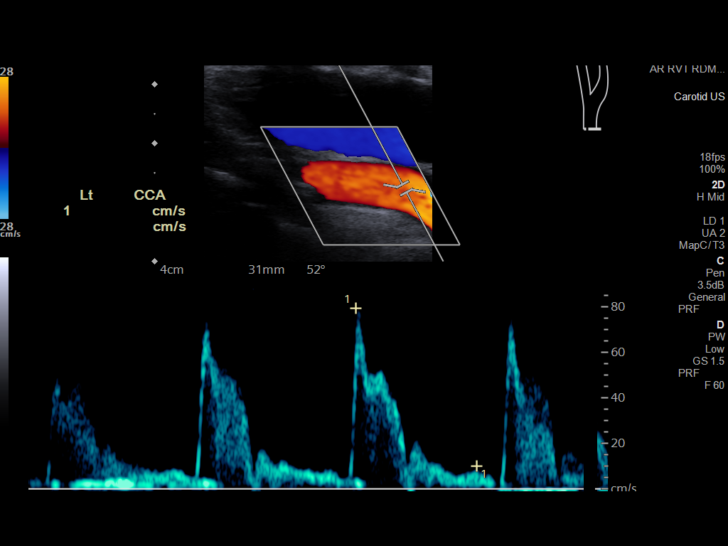
[im 44/68]
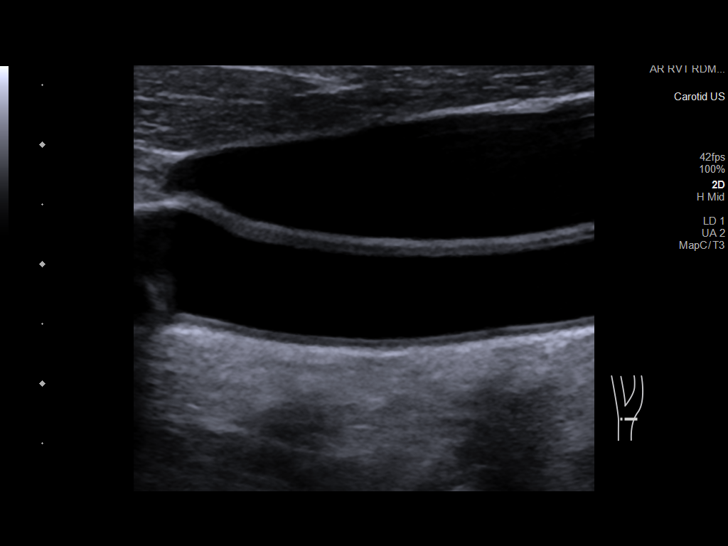
[im 50/68]
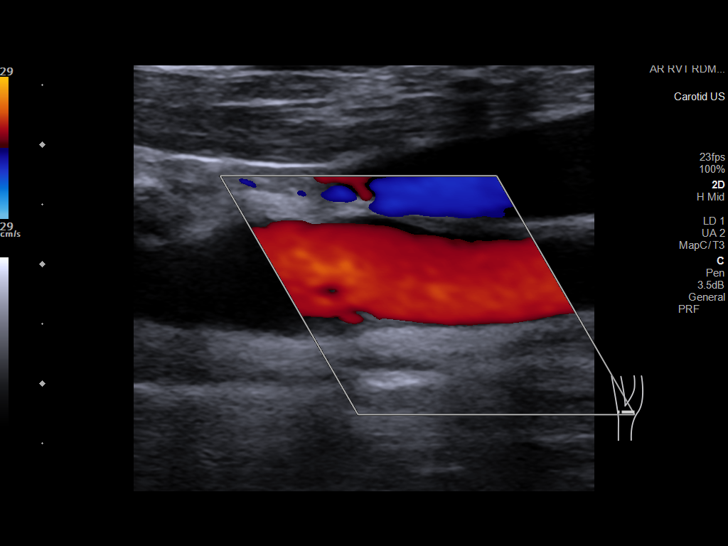
[im 56/68]
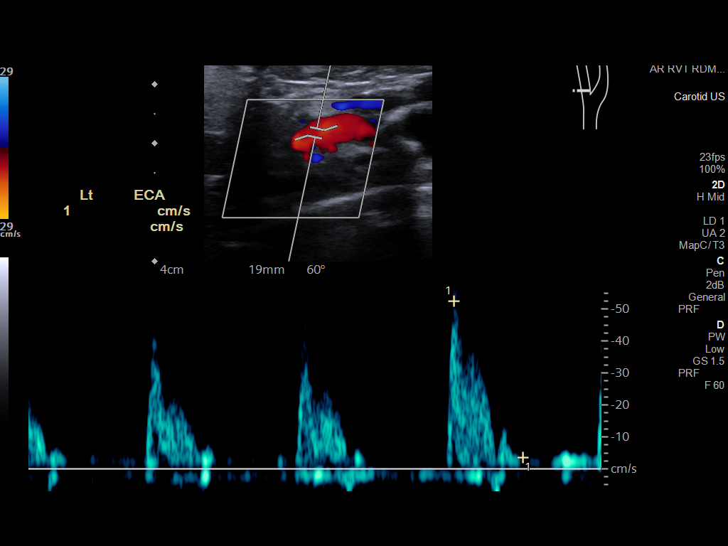
[im 62/68]
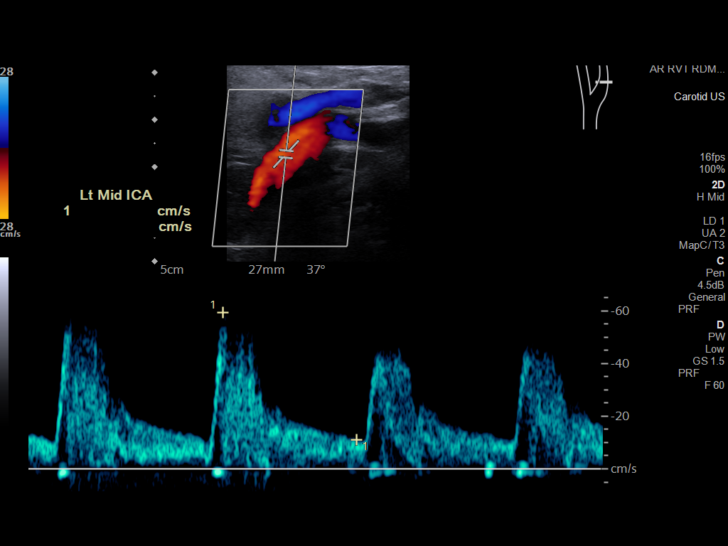
[im 68/68]
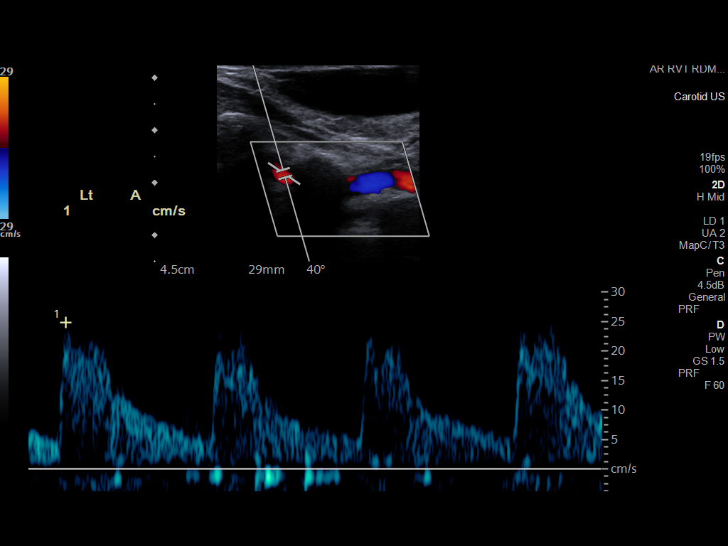

[13 of 24 positions shown; findings below may reference images not displayed]

FINDINGS: Criteria: Quantification of carotid stenosis is based on velocity
parameters that correlate the residual internal carotid diameter
with NASCET-based stenosis levels, using the diameter of the distal
internal carotid lumen as the denominator for stenosis measurement.

The following velocity measurements were obtained:

RIGHT

ICA: 88/14 cm/sec

CCA: 63/8 cm/sec

SYSTOLIC ICA/CCA RATIO:

ECA: 74 cm/sec

LEFT

ICA: 60/13 cm/sec

CCA: 61/7 cm/sec

SYSTOLIC ICA/CCA RATIO:

ECA: 53 cm/sec

RIGHT CAROTID ARTERY: Minor intimal thickening and minimal
hypoechoic plaque formation. No hemodynamically significant right
ICA stenosis, velocity elevation, or turbulent flow. Degree of
narrowing less than 50%.

RIGHT VERTEBRAL ARTERY:  Normal antegrade flow

LEFT CAROTID ARTERY: Similar minor intimal thickening and minimal
hypoechoic plaque formation. No hemodynamically significant left ICA
stenosis, velocity elevation, or turbulent flow.

LEFT VERTEBRAL ARTERY:  Normal antegrade flow
IMPRESSION: Minor carotid atherosclerosis. No hemodynamically significant ICA
stenosis. Degree of narrowing less than 50% bilaterally by
ultrasound criteria.

Patent antegrade vertebral flow bilaterally

## 2021-10-04 DIAGNOSIS — H35363 Drusen (degenerative) of macula, bilateral: Secondary | ICD-10-CM | POA: Diagnosis not present

## 2021-10-04 DIAGNOSIS — H401131 Primary open-angle glaucoma, bilateral, mild stage: Secondary | ICD-10-CM | POA: Diagnosis not present

## 2021-10-04 DIAGNOSIS — H2511 Age-related nuclear cataract, right eye: Secondary | ICD-10-CM | POA: Diagnosis not present

## 2021-10-04 DIAGNOSIS — E119 Type 2 diabetes mellitus without complications: Secondary | ICD-10-CM | POA: Diagnosis not present

## 2021-10-09 ENCOUNTER — Other Ambulatory Visit: Payer: Self-pay | Admitting: Nurse Practitioner

## 2021-10-09 DIAGNOSIS — E1122 Type 2 diabetes mellitus with diabetic chronic kidney disease: Secondary | ICD-10-CM

## 2021-10-09 DIAGNOSIS — Z794 Long term (current) use of insulin: Secondary | ICD-10-CM

## 2021-10-11 ENCOUNTER — Other Ambulatory Visit: Payer: Self-pay

## 2021-10-11 ENCOUNTER — Ambulatory Visit: Payer: Medicare Other | Admitting: "Endocrinology

## 2021-10-11 ENCOUNTER — Encounter: Payer: Self-pay | Admitting: "Endocrinology

## 2021-10-11 VITALS — BP 150/64 | HR 72 | Ht 61.0 in | Wt 127.0 lb

## 2021-10-11 DIAGNOSIS — I10 Essential (primary) hypertension: Secondary | ICD-10-CM | POA: Diagnosis not present

## 2021-10-11 DIAGNOSIS — E1122 Type 2 diabetes mellitus with diabetic chronic kidney disease: Secondary | ICD-10-CM | POA: Diagnosis not present

## 2021-10-11 DIAGNOSIS — N1831 Chronic kidney disease, stage 3a: Secondary | ICD-10-CM

## 2021-10-11 DIAGNOSIS — Z794 Long term (current) use of insulin: Secondary | ICD-10-CM

## 2021-10-11 DIAGNOSIS — E782 Mixed hyperlipidemia: Secondary | ICD-10-CM | POA: Diagnosis not present

## 2021-10-11 NOTE — Progress Notes (Signed)
10/11/2021  Endocrinology follow-up note   Subjective:    Patient ID: Emily Jennings, female    DOB: [birth year],    Past Medical History:  Diagnosis Date   Allergy    Anxiety    ANXIETY DISORDER, GENERALIZED 07/15/2007   Qualifier: Diagnosis of  By: Rennie Plowman     Arthritis    Cancer [facility]) 2009   breast, carcinoma in situ left   Carcinoma in situ of breast 05/21/2008   Qualifier: Diagnosis of  By: Moshe Cipro MD, Margaret  Diagnosed in 2009, completed 5 year course of tamoxifen, no evidence of recurrence    Carotid stenosis    11/16/2005  mild plaque formation and stenosis proximal right ECA   Cataract    Complication of anesthesia    Coronary artery disease    cardiac catheterization on 03/20/2006  LAD mid 40% stenosis, left circumflex mild 40% stenosis, RCA mid-vessel 40% to 50% lesion   EF 60%   Diabetes mellitus    GERD (gastroesophageal reflux disease)    Hernia, inguinal    left   Hyperglycemia    Hypertension    Insomnia 11/16/2011   Low blood potassium    Non-insulin dependent type 2 diabetes mellitus ([place])    Osteoporosis    Shortness of breath    2D Echocardiogram 01/26/2009   EF of greater than 55%, mild MR, mild TR, normal ventricular function   Thickened endometrium 10/26/2017   Noted by gyne in 2017, missed 6 month follow up, referred in 09/2017   Ventricular tachycardia, non-sustained    developed during stress test 02/08/2006, spontaneously aborted, mild reversible apical defect   Past Surgical History:  Procedure Laterality Date   BREAST LUMPECTOMY Left 2009   Left breast 2009   CATARACT EXTRACTION W/PHACO Left 10/28/2014   Procedure: PHACO EMULSION CATARACT EXTRACTION WITH INTRAOCULAR LENS IMPLANT LEFT EYE ([place]);  Surgeon: Marylynn Pearson, MD;  Location: [place];  Service: Ophthalmology;  Laterality: Left;   COLONOSCOPY     cyst removed from left foot     REFRACTIVE SURGERY Left    Social History   Socioeconomic History   Marital status: Married     Spouse name: Not on file   Number of children: 2   Years of education: na   Highest education level: Some college, no degree  Occupational History   Occupation: retired    Fish farm manager: retired  Tobacco Use   Smoking status: Never   Smokeless tobacco: Never  Scientific laboratory technician Use: Never used  Substance and Sexual Activity   Alcohol use: No   Drug use: No   Sexual activity: Not Currently    Birth control/protection: Post-menopausal  Other Topics Concern   Not on file  Social History Narrative   Lives with her husband and son and attends yoga at the [facility] 2 days a week and speaks to her grandson nightly on the phone   Social Determinants of Health   Financial Resource Strain: Low Risk    Difficulty of Paying Living Expenses: Not hard at all  Food Insecurity: No Food Insecurity   Worried About Charity fundraiser in the Last Year: Never true   Arboriculturist in the Last Year: Never true  Transportation Needs: No Transportation Needs   Lack of Transportation (Medical): No   Lack of Transportation (Non-Medical): No  Physical Activity: Insufficiently Active   Days of Exercise per Week: 7 days   Minutes of Exercise per Session: 20 min  Stress: No Stress Concern Present   Feeling of Stress : Not at all  Social Connections: Moderately Isolated   Frequency of Communication with Friends and Family: More than three times a week   Frequency of Social Gatherings with Friends and Family: Twice a week   Attends Religious Services: Never   Marine scientist or Organizations: No   Attends Archivist Meetings: Never   Marital Status: Married   Outpatient Encounter Medications as of 10/11/2021  Medication Sig   acetaminophen (TYLENOL) 500 MG tablet Take one tablet two times daily for 3 days, then as needed   alendronate (FOSAMAX) 70 MG tablet TAKE 1 TABLET BY MOUTH EVERY 7 DAYS. TAKE WITH A FULL GLASS OF WATER ON AN EMPTY STOMACH   amLODipine-olmesartan (AZOR) 10-40 MG tablet  Take 1 tablet by mouth daily.   aspirin EC 81 MG tablet Take 81 mg by mouth daily.   benzonatate (TESSALON) 100 MG capsule Take 1 capsule (100 mg total) by mouth 2 (two) times daily as needed for cough. (Patient not taking: Reported on 07/28/2021)   blood glucose meter kit and supplies KIT One touch Ultra. Use three times daily as directed. (FOR ICD E11.65)   carvedilol (COREG) 12.5 MG tablet Take 1 tablet (12.5 mg total) by mouth 2 (two) times daily with a meal.   cetirizine (ZYRTEC) 10 MG tablet Take 10 mg by mouth daily.   cholecalciferol (VITAMIN D3) 25 MCG (1000 UT) tablet Take 2,000 Units by mouth daily.   glucose blood (ONETOUCH ULTRA) test strip USE TO TEST 3 TIMES A DAY   HUMALOG MIX 75/25 (75-25) 100 UNIT/ML SUSP injection INJECT SUBCUTANEOUSLY 10 UNITS TWICE DAILY   Insulin Pen Needle 32G X 4 MM MISC 1 each by Does not apply route as directed.   Insulin Syringe-Needle U-100 (BD INSULIN SYRINGE U/F) 31G X 5/16" 0.5 ML MISC USE AS DIRECTED TWICE DAILY   KLOR-CON M20 20 MEQ tablet TAKE 1 TABLET BY MOUTH TWICE A DAY   Multiple Vitamins-Minerals (CENTRUM SILVER 50+WOMEN) TABS Take 1 tablet by mouth daily.   mupirocin ointment (BACTROBAN) 2 % Apply topically 2 (two) times daily.   OneTouch Delica Lancets 25K MISC 1 each by Does not apply route 3 (three) times daily. Use to check glucose three times daily   RESTASIS 0.05 % ophthalmic emulsion    spironolactone (ALDACTONE) 25 MG tablet TAKE 1 TABLET BY MOUTH EVERY DAY   No facility-administered encounter medications on file as of 10/11/2021.   ALLERGIES: Allergies  Allergen Reactions   Benadryl [Diphenhydramine Hcl] Hypertension   Citalopram    Metformin And Related Diarrhea    Lost appetite and weight    Tramadol Other (See Comments)    Felt light headed and dizzy   Crestor [Rosuvastatin] Other (See Comments)    "Feet swelling", makes her feel weak   VACCINATION STATUS: Immunization History  Administered Date(s) Administered    Fluad Quad(high Dose 65+) 06/02/2019, 05/27/2020, 05/27/2021   Influenza Split 07/17/2011   Influenza Whole 06/18/2007, 07/29/2009, 05/16/2013   Influenza, High Dose Seasonal PF 08/13/2018   Influenza,inj,Quad PF,6+ Mos 05/19/2014, 08/22/2016, 05/17/2017   Moderna SARS-COV2 Booster Vaccination 01/31/2021   Moderna Sars-Covid-2 Vaccination 10/26/2019, 11/26/2019, 07/29/2020   Pneumococcal Conjugate-13 03/24/2014   Pneumococcal Polysaccharide-23 05/20/2008   Td 06/18/2002   Tdap 07/17/2011, 12/28/2020    Diabetes She presents for her follow-up diabetic visit. She has type 2 diabetes mellitus. Onset time: She was diagnosed at approximate age of 16 years.  Her disease course has been worsening. There are no hypoglycemic associated symptoms. Pertinent negatives for hypoglycemia include no confusion, headaches, pallor or seizures. There are no diabetic associated symptoms. Pertinent negatives for diabetes include no chest pain, no fatigue, no polydipsia, no polyphagia and no polyuria. There are no hypoglycemic complications. Symptoms are worsening. Diabetic complications include heart disease and nephropathy. Risk factors for coronary artery disease include diabetes mellitus, dyslipidemia, hypertension, sedentary lifestyle and post-menopausal. Current diabetic treatment includes insulin injections. She is compliant with treatment most of the time. Her weight is fluctuating minimally. She is following a generally unhealthy diet. When asked about meal planning, she reported none. She has had a previous visit with a dietitian. She never participates in exercise. Her home blood glucose trend is fluctuating minimally. Her breakfast blood glucose range is generally 140-180 mg/dl. Her bedtime blood glucose range is generally 140-180 mg/dl. Her overall blood glucose range is 140-180 mg/dl. (She presents with her meter showing average blood glucose of 198-217, but random hypoglycemia into 40's.  Her point-of-care A1c  6.8%, overall improving.   She is currently on Humalog 75/25 10 units twice daily AC. ) An ACE inhibitor/angiotensin II receptor blocker is being taken. She does not see a podiatrist.Eye exam is current.  Hyperlipidemia This is a chronic problem. The current episode started more than 1 year ago. The problem is controlled. Recent lipid tests were reviewed and are normal. Exacerbating diseases include chronic renal disease and diabetes. Factors aggravating her hyperlipidemia include beta blockers and fatty foods. Pertinent negatives include no chest pain, myalgias or shortness of breath. Current antihyperlipidemic treatment includes statins. The current treatment provides moderate improvement of lipids. Compliance problems include adherence to diet and adherence to exercise.  Risk factors for coronary artery disease include diabetes mellitus, dyslipidemia, a sedentary lifestyle, hypertension and post-menopausal.  Hypertension This is a chronic problem. The current episode started more than 1 year ago. The problem has been waxing and waning since onset. The problem is uncontrolled. Pertinent negatives include no chest pain, headaches, palpitations or shortness of breath. Risk factors for coronary artery disease include diabetes mellitus, dyslipidemia, sedentary lifestyle and post-menopausal state. Past treatments include angiotensin blockers, calcium channel blockers, beta blockers and diuretics. The current treatment provides moderate improvement. There are no compliance problems.  Hypertensive end-organ damage includes kidney disease and CAD/MI. Identifiable causes of hypertension include chronic renal disease.   Review of systems  Constitutional: + Minimally fluctuating body weight,  current Body mass index is 24 kg/m. , no fatigue, no subjective hyperthermia, no subjective hypothermia    Objective:    BP (!) 150/64    Pulse 72    Ht 5' 1"  (1.549 m)    Wt 127 lb (57.6 kg)    BMI 24.00 kg/m   Wt  Readings from Last 3 Encounters:  10/11/21 127 lb (57.6 kg)  07/28/21 130 lb 1.3 oz (59 kg)  06/24/21 129 lb (58.5 kg)    BP Readings from Last 3 Encounters:  10/11/21 (!) 150/64  07/28/21 (!) 156/66  06/24/21 (!) 165/74    Physical Exam- Limited  Constitutional:  Body mass index is 24 kg/m. , not in acute distress, mildly anxious state of mind, forgetful at times     Results for orders placed or performed in visit on 07/12/21  Novel Coronavirus, NAA ([facility])   Specimen: Nasopharyngeal(NP) swabs in vial transport medium  Result Value Ref Range   SARS-CoV-2, NAA Not Detected Not Detected  SARS-COV-2, NAA 2 DAY TAT  Result Value Ref Range   SARS-CoV-2, NAA 2 DAY TAT Performed    Complete Blood Count (Most recent): Lab Results  Component Value Date   WBC 8.7 12/27/2020   HGB 12.7 12/27/2020   HCT 40.5 12/27/2020   MCV 96.9 12/27/2020   PLT 235 12/27/2020   Chemistry (most recent): Lab Results  Component Value Date   NA 138 07/28/2021   K 4.4 07/28/2021   CL 98 07/28/2021   CO2 26 07/28/2021   BUN 23 07/28/2021   CREATININE 1.49 (H) 07/28/2021   Diabetic Labs (most recent): Lab Results  Component Value Date   HGBA1C 6.8 (H) 07/28/2021   HGBA1C 6.9 03/15/2021   HGBA1C 7.1 (A) 11/17/2020   Lipid Panel     Component Value Date/Time   CHOL 193 05/24/2021 0852   TRIG 38 05/24/2021 0852   HDL 116 05/24/2021 0852   CHOLHDL 1.7 05/24/2021 0852   CHOLHDL 1.8 06/29/2020 0916   VLDL 8 10/31/2016 0845   LDLCALC 69 05/24/2021 0852   LDLCALC 80 06/29/2020 0916     Assessment & Plan:   1) Type 2 diabetes mellitus with stage 3 chronic kidney disease, with long-term current use of insulin    She presents with her meter showing average blood glucose of 198-217, but random hypoglycemia into 40's.  Her point-of-care A1c 6.8%, overall improving.   She is currently on Humalog 75/25 10 units twice daily AC.   Patient remains at a high risk for more acute and chronic  complications of diabetes which include CAD, CVA, CKD, retinopathy, and neuropathy. These are all discussed in detail with the patient.  - Nutritional counseling repeated at each appointment due to patients tendency to fall back in to old habits.  - she acknowledges that there is a room for improvement in her food and drink choices. - Suggestion is made for her to avoid simple carbohydrates  from her diet including Cakes, Sweet Desserts, Ice Cream, Soda (diet and regular), Sweet Tea, Candies, Chips, Cookies, Store Bought Juices, Alcohol , Artificial Sweeteners,  Coffee Creamer, and "Sugar-free" Products, Lemonade. This will help patient to have more stable blood glucose profile and potentially avoid unintended weight gain.  The following Lifestyle Medicine recommendations according to [place]  [facility]) were discussed and and offered to patient and she  agrees to start the journey:  A. Whole Foods, Plant-Based Nutrition comprising of fruits and vegetables, plant-based proteins, whole-grain carbohydrates was discussed in detail with the patient.   A list for source of those nutrients were also provided to the patient.  Patient will use only water or unsweetened tea for hydration.    - I encouraged the patient to switch to  unprocessed or minimally processed complex starch and increased protein intake (animal or plant source), fruits, and vegetables.   - Patient is advised to stick to a routine mealtimes to eat 3 meals  a day and avoid unnecessary snacks ( to snack only to correct hypoglycemia).  I have approached patient to continue on  intensive monitoring of blood glucose and insulin therapy, and patient agrees.   - She continued to do fairly well on a simplified treatment with premixed insulin, however, lately she does not always match her injections with a meal. -Avoiding hypoglycemia is the #1 priority in her case, she does not adequate social support.  -Based on  her presentation with random hypoglycemia , she will be kept on low dose insulin.  -she is advised to  continue Humalog 75/25 10 units twice daily AC-with breakfast and with supper only when Premeal blood glucose are above 100 mg per DL.    She is advised to stay off of glipizide.    She is advised and willing to continue to monitor blood glucose at least twice a day-breakfast, before supper, and at any other time as needed.     She is instructed to call the clinic if she has glucose readings less than 70 or greater than 200 for 3 tests in a row.    - She is warned not to inject insulin without proper monitoring of blood glucose.  -Target numbers for A1c, LDL, HDL, Triglycerides, Waist Circumference were discussed in detail.  2) HTN:  Her BP is not controlled to target.  She is advised to continue Amlodipine 10 mg po daily, Losartan 50 mg po daily, Metoprolol 50 mg po twice daily, and Spironolactone 25 mg po daily.  May need to adjust dose at next visit if BP remains elevated.  3) Lipids:  Her most recent lipid panel from 06/29/20 shows controlled LDL of 80.  She is advised to continue Crestor 5 mg p.o. daily at bedtime.  She is also on  Lovaza 1 gm po twice daily.  Side effects and precautions discussed with patient.  4)  Weight/Diet:  Her Body mass index is 24 kg/m.-not a candidate for weight loss.  CDE consult in progress, exercise, and carbs information provided.   5) Chronic Care: -Patient is on ARB medications and Statins and encouraged to continue to follow up with Ophthalmology, Podiatrist at least yearly or according to recommendations, and advised to stay away from smoking. I have recommended yearly flu vaccine and pneumonia vaccination at least every 5 years; and  sleep for at least 7 hours a day.   She is advised to maintain close follow-up with her PCP:  [name], MD      I spent 32 minutes in the care of the patient today including review of labs from [place],  Lipids, Thyroid Function, Hematology (current and previous including abstractions from other facilities); face-to-face time discussing  her blood glucose readings/logs, discussing hypoglycemia and hyperglycemia episodes and symptoms, medications doses, her options of short and long term treatment based on the latest standards of care / guidelines;  discussion about incorporating lifestyle medicine;  and documenting the encounter.    Please refer to Patient Instructions for Blood Glucose Monitoring and Insulin/Medications Dosing Guide"  in media tab for additional information. Please  also refer to " Patient Self Inventory" in the Media  tab for reviewed elements of pertinent patient history.  Nadine Hewitt Shorts participated in the discussions, expressed understanding, and voiced agreement with the above plans.  All questions were answered to her satisfaction. she is encouraged to contact clinic should she have any questions or concerns prior to her return visit.    Follow up plan: Return in about 4 weeks (around 11/08/2021) for F/U with Meter and Logs Only - no Labs.  Rayetta Pigg, [facility] [address] [place], [place] [address] Phone: [phone] Fax: [phone]  10/11/2021, 4:52 PM

## 2021-10-12 ENCOUNTER — Encounter: Payer: Self-pay | Admitting: Family Medicine

## 2021-10-12 ENCOUNTER — Ambulatory Visit (INDEPENDENT_AMBULATORY_CARE_PROVIDER_SITE_OTHER): Payer: Medicare Other | Admitting: Family Medicine

## 2021-10-12 VITALS — BP 175/73 | HR 73 | Ht 61.0 in | Wt 127.1 lb

## 2021-10-12 DIAGNOSIS — N1831 Chronic kidney disease, stage 3a: Secondary | ICD-10-CM

## 2021-10-12 DIAGNOSIS — I10 Essential (primary) hypertension: Secondary | ICD-10-CM

## 2021-10-12 DIAGNOSIS — Z794 Long term (current) use of insulin: Secondary | ICD-10-CM

## 2021-10-12 DIAGNOSIS — Z1231 Encounter for screening mammogram for malignant neoplasm of breast: Secondary | ICD-10-CM

## 2021-10-12 DIAGNOSIS — E1122 Type 2 diabetes mellitus with diabetic chronic kidney disease: Secondary | ICD-10-CM

## 2021-10-12 DIAGNOSIS — Z0000 Encounter for general adult medical examination without abnormal findings: Secondary | ICD-10-CM

## 2021-10-12 MED ORDER — SPIRONOLACTONE 50 MG PO TABS: 50.0000 mg | ORAL_TABLET | Freq: Every day | ORAL | 3 refills | Status: DC

## 2021-10-12 NOTE — Progress Notes (Signed)
Emily Jennings     MRN: [id number]      DOB: 1934-07-30  HPI: Patient is in for annual physical exam. Uncontrolled hypertension is also addressed. Recent labs,  are reviewed. Immunization is reviewed , and  updated if needed.   PE: BP (!) 175/73    Pulse 73    Ht 5\' 1"  (1.549 m)    Wt 127 lb 1.3 oz (57.6 kg)    SpO2 95%    BMI 24.01 kg/m   Pleasant  female, alert and oriented x 3, in no cardio-pulmonary distress. Afebrile. HEENT No facial trauma or asymetry. Sinuses non tender.  Extra occullar muscles intact.. External ears normal, . Neck: supple, no adenopathy,JVD or thyromegaly.No bruits.  Chest: Clear to ascultation bilaterally.No crackles or wheezes. Non tender to palpation   Cardiovascular system; Heart sounds normal,  S1 and  S2 ,no S3.  No murmur, or thrill. Apical beat not displaced Peripheral pulses normal.  Abdomen: Soft, non tender,  Musculoskeletal exam: Full ROM of spine, hips , shoulders and knees. No deformity ,swelling or crepitus noted. No muscle wasting or atrophy.   Neurologic: Cranial nerves 2 to 12 intact. Power, tone ,sensation and reflexes normal throughout. No disturbance in gait. No tremor.  Skin: Intact, no ulceration, erythema , scaling or rash noted. Pigmentation normal throughout  Psych; Normal mood and affect. Judgement and concentration normal   Assessment & Plan:  Annual physical exam Annual exam as documented. Counseling done  re healthy lifestyle involving commitment to 150 minutes exercise per week, heart healthy diet, and attaining healthy weight.The importance of adequate sleep also discussed. Regular seat belt use and home safety, is also discussed. Changes in health habits are decided on by the patient with goals and time frames  set for achieving them. Immunization and cancer screening needs are specifically addressed at this visit.   Essential hypertension Uncontrolled , increase spironolactone dose and re  assess DASH diet and commitment to daily physical activity for a minimum of 30 minutes discussed and encouraged, as a part of hypertension management. The importance of attaining a healthy weight is also discussed.  BP/Weight 10/12/2021 10/11/2021 07/28/2021 06/24/2021 05/27/2021 04/14/2021 2/45/8099  Systolic BP 833 825 053 976 734 193 790  Diastolic BP 73 64 66 74 75 70 66  Wt. (Lbs) 127.08 127 130.08 129 127 125.12 129.6  BMI 24.01 24 24.58 24.37 24 23.64 24.49       Type 2 diabetes mellitus with stage 3a chronic kidney disease, with long-term current use of insulin ([place]) Managed by Endo and controlled Emily Jennings is reminded of the importance of commitment to daily physical activity for 30 minutes or more, as able and the need to limit carbohydrate intake to 30 to 60 grams per meal to help with blood sugar control.   The need to take medication as prescribed, test blood sugar as directed, and to call between visits if there is a concern that blood sugar is uncontrolled is also discussed.   Emily Jennings is reminded of the importance of daily foot exam, annual eye examination, and good blood sugar, blood pressure and cholesterol control.  Diabetic Labs Latest Ref Rng & Units 07/28/2021 05/24/2021 03/15/2021 01/04/2021 12/27/2020  HbA1c 4.8 - 5.6 % 6.8(H) - 6.9 - -  Microalbumin mg/L - - - - -  Micro/Creat Ratio <30 mcg/mg creat - - - - -  Chol 100 - 199 mg/dL - 193 - - -  HDL >39 mg/dL - 116 - - -  Calc LDL 0 - 99 mg/dL - 69 - - -  Triglycerides 0 - 149 mg/dL - 38 - - -  Creatinine 0.57 - 1.00 mg/dL 1.49(H) 1.71(H) - 1.46(H) 1.34(H)   BP/Weight 10/12/2021 10/11/2021 07/28/2021 06/24/2021 05/27/2021 04/14/2021 5/33/1740  Systolic BP 992 780 044 715 806 386 854  Diastolic BP 73 64 66 74 75 70 66  Wt. (Lbs) 127.08 127 130.08 129 127 125.12 129.6  BMI 24.01 24 24.58 24.37 24 23.64 24.49   Foot/eye exam completion dates Latest Ref Rng & Units 11/10/2020 11/10/2020  Eye Exam No Retinopathy No Retinopathy No  Retinopathy  Foot Form Completion - - -

## 2021-10-12 NOTE — Assessment & Plan Note (Signed)

## 2021-10-12 NOTE — Patient Instructions (Signed)
F/U in 6 weeks, call if you neeed me sooner  New higher dose of spironolactone for blood pressure is 50 mg once daily , ( same as two 25 mg tablet daily0  Lipid, cmp and EGF 5 days before next appointment, please drink water  even the day you are having the blood work  Please schedule mammogram at checkout  Please get shingrix vaccines at your pharmacy if able  Thanks for choosing [facility], we consider it a privelige to serve you.

## 2021-10-18 ENCOUNTER — Other Ambulatory Visit: Payer: Self-pay | Admitting: Family Medicine

## 2021-10-23 ENCOUNTER — Encounter: Payer: Self-pay | Admitting: Family Medicine

## 2021-10-23 NOTE — Assessment & Plan Note (Signed)
Uncontrolled , increase spironolactone dose and re assess DASH diet and commitment to daily physical activity for a minimum of 30 minutes discussed and encouraged, as a part of hypertension management. The importance of attaining a healthy weight is also discussed.  BP/Weight 10/12/2021 10/11/2021 07/28/2021 06/24/2021 05/27/2021 04/14/2021 10/08/6242  Systolic BP 695 072 257 505 183 358 251  Diastolic BP 73 64 66 74 75 70 66  Wt. (Lbs) 127.08 127 130.08 129 127 125.12 129.6  BMI 24.01 24 24.58 24.37 24 23.64 24.49

## 2021-10-23 NOTE — Assessment & Plan Note (Signed)
Managed by Endo and controlled [name] is reminded of the importance of commitment to daily physical activity for 30 minutes or more, as able and the need to limit carbohydrate intake to 30 to 60 grams per meal to help with blood sugar control.   The need to take medication as prescribed, test blood sugar as directed, and to call between visits if there is a concern that blood sugar is uncontrolled is also discussed.   Emily Jennings is reminded of the importance of daily foot exam, annual eye examination, and good blood sugar, blood pressure and cholesterol control.  Diabetic Labs Latest Ref Rng & Units 07/28/2021 05/24/2021 03/15/2021 01/04/2021 12/27/2020  HbA1c 4.8 - 5.6 % 6.8(H) - 6.9 - -  Microalbumin mg/L - - - - -  Micro/Creat Ratio <30 mcg/mg creat - - - - -  Chol 100 - 199 mg/dL - 193 - - -  HDL >39 mg/dL - 116 - - -  Calc LDL 0 - 99 mg/dL - 69 - - -  Triglycerides 0 - 149 mg/dL - 38 - - -  Creatinine 0.57 - 1.00 mg/dL 1.49(H) 1.71(H) - 1.46(H) 1.34(H)   BP/Weight 10/12/2021 10/11/2021 07/28/2021 06/24/2021 05/27/2021 04/14/2021 10/15/1186  Systolic BP 677 373 668 159 470 761 518  Diastolic BP 73 64 66 74 75 70 66  Wt. (Lbs) 127.08 127 130.08 129 127 125.12 129.6  BMI 24.01 24 24.58 24.37 24 23.64 24.49   Foot/eye exam completion dates Latest Ref Rng & Units 11/10/2020 11/10/2020  Eye Exam No Retinopathy No Retinopathy No Retinopathy  Foot Form Completion - - -

## 2021-11-08 ENCOUNTER — Other Ambulatory Visit: Payer: Self-pay

## 2021-11-08 ENCOUNTER — Encounter: Payer: Self-pay | Admitting: "Endocrinology

## 2021-11-08 ENCOUNTER — Ambulatory Visit: Payer: Medicare Other | Admitting: "Endocrinology

## 2021-11-08 VITALS — BP 128/62 | HR 72 | Ht 61.0 in | Wt 126.6 lb

## 2021-11-08 DIAGNOSIS — N1831 Chronic kidney disease, stage 3a: Secondary | ICD-10-CM

## 2021-11-08 DIAGNOSIS — E1122 Type 2 diabetes mellitus with diabetic chronic kidney disease: Secondary | ICD-10-CM | POA: Diagnosis not present

## 2021-11-08 DIAGNOSIS — E782 Mixed hyperlipidemia: Secondary | ICD-10-CM | POA: Diagnosis not present

## 2021-11-08 DIAGNOSIS — Z794 Long term (current) use of insulin: Secondary | ICD-10-CM | POA: Diagnosis not present

## 2021-11-08 DIAGNOSIS — I10 Essential (primary) hypertension: Secondary | ICD-10-CM

## 2021-11-08 LAB — POCT GLYCOSYLATED HEMOGLOBIN (HGB A1C): HbA1c, POC (controlled diabetic range): 7.2 % — AB (ref 0.0–7.0)

## 2021-11-08 NOTE — Progress Notes (Signed)
11/08/2021  Endocrinology follow-up note   Subjective:    Patient ID: Emily Jennings, female    DOB: 1934-03-07,    Past Medical History:  Diagnosis Date   Allergy    Anxiety    ANXIETY DISORDER, GENERALIZED 07/15/2007   Qualifier: Diagnosis of  By: Rennie Plowman     Arthritis    Cancer [facility]) 2009   breast, carcinoma in situ left   Carcinoma in situ of breast 05/21/2008   Qualifier: Diagnosis of  By: Moshe Cipro MD, Margaret  Diagnosed in 2009, completed 5 year course of tamoxifen, no evidence of recurrence    Carotid stenosis    11/16/2005  mild plaque formation and stenosis proximal right ECA   Cataract    Complication of anesthesia    Coronary artery disease    cardiac catheterization on 03/20/2006  LAD mid 40% stenosis, left circumflex mild 40% stenosis, RCA mid-vessel 40% to 50% lesion   EF 60%   Diabetes mellitus    GERD (gastroesophageal reflux disease)    Hernia, inguinal    left   Hyperglycemia    Hypertension    Insomnia 11/16/2011   Low blood potassium    Non-insulin dependent type 2 diabetes mellitus ([place])    Osteoporosis    Shortness of breath    2D Echocardiogram 01/26/2009   EF of greater than 55%, mild MR, mild TR, normal ventricular function   Thickened endometrium 10/26/2017   Noted by gyne in 2017, missed 6 month follow up, referred in 09/2017   Ventricular tachycardia, non-sustained    developed during stress test 02/08/2006, spontaneously aborted, mild reversible apical defect   Past Surgical History:  Procedure Laterality Date   BREAST LUMPECTOMY Left 2009   Left breast 2009   CATARACT EXTRACTION W/PHACO Left 10/28/2014   Procedure: PHACO EMULSION CATARACT EXTRACTION WITH INTRAOCULAR LENS IMPLANT LEFT EYE ([place]);  Surgeon: Marylynn Pearson, MD;  Location: [place];  Service: Ophthalmology;  Laterality: Left;   COLONOSCOPY     cyst removed from left foot     REFRACTIVE SURGERY Left    Social History   Socioeconomic History   Marital status: Married     Spouse name: Not on file   Number of children: 2   Years of education: na   Highest education level: Some college, no degree  Occupational History   Occupation: retired    Fish farm manager: retired  Tobacco Use   Smoking status: Never   Smokeless tobacco: Never  Scientific laboratory technician Use: Never used  Substance and Sexual Activity   Alcohol use: No   Drug use: No   Sexual activity: Not Currently    Birth control/protection: Post-menopausal  Other Topics Concern   Not on file  Social History Narrative   Lives with her husband and son and attends yoga at the [facility] 2 days a week and speaks to her grandson nightly on the phone   Social Determinants of Health   Financial Resource Strain: Low Risk    Difficulty of Paying Living Expenses: Not hard at all  Food Insecurity: No Food Insecurity   Worried About Charity fundraiser in the Last Year: Never true   Arboriculturist in the Last Year: Never true  Transportation Needs: No Transportation Needs   Lack of Transportation (Medical): No   Lack of Transportation (Non-Medical): No  Physical Activity: Insufficiently Active   Days of Exercise per Week: 7 days   Minutes of Exercise per Session: 20 min  Stress: No Stress Concern Present   Feeling of Stress : Not at all  Social Connections: Moderately Isolated   Frequency of Communication with Friends and Family: More than three times a week   Frequency of Social Gatherings with Friends and Family: Twice a week   Attends Religious Services: Never   Marine scientist or Organizations: No   Attends Archivist Meetings: Never   Marital Status: Married   Outpatient Encounter Medications as of 11/08/2021  Medication Sig   acetaminophen (TYLENOL) 500 MG tablet Take one tablet two times daily for 3 days, then as needed   alendronate (FOSAMAX) 70 MG tablet TAKE 1 TABLET BY MOUTH EVERY 7 DAYS. TAKE WITH A FULL GLASS OF WATER ON AN EMPTY STOMACH   amLODipine-olmesartan (AZOR) 10-40 MG tablet  TAKE 1 TABLET BY MOUTH EVERY DAY   aspirin EC 81 MG tablet Take 81 mg by mouth daily.   benzonatate (TESSALON) 100 MG capsule Take 1 capsule (100 mg total) by mouth 2 (two) times daily as needed for cough.   blood glucose meter kit and supplies KIT One touch Ultra. Use three times daily as directed. (FOR ICD E11.65)   carvedilol (COREG) 12.5 MG tablet Take 1 tablet (12.5 mg total) by mouth 2 (two) times daily with a meal.   cholecalciferol (VITAMIN D3) 25 MCG (1000 UT) tablet Take 2,000 Units by mouth daily.   glucose blood (ONETOUCH ULTRA) test strip USE TO TEST 3 TIMES A DAY   HUMALOG MIX 75/25 (75-25) 100 UNIT/ML SUSP injection INJECT SUBCUTANEOUSLY 10 UNITS TWICE DAILY   Insulin Pen Needle 32G X 4 MM MISC 1 each by Does not apply route as directed.   Insulin Syringe-Needle U-100 (BD INSULIN SYRINGE U/F) 31G X 5/16" 0.5 ML MISC USE AS DIRECTED TWICE DAILY   ketorolac (ACULAR) 0.5 % ophthalmic solution Place 1 drop into the left eye 4 (four) times daily.   KLOR-CON M20 20 MEQ tablet TAKE 1 TABLET BY MOUTH TWICE A DAY   losartan (COZAAR) 100 MG tablet    Multiple Vitamins-Minerals (CENTRUM SILVER 50+WOMEN) TABS Take 1 tablet by mouth daily.   mupirocin ointment (BACTROBAN) 2 % Apply topically 2 (two) times daily.   OneTouch Delica Lancets 73Z MISC 1 each by Does not apply route 3 (three) times daily. Use to check glucose three times daily   RESTASIS 0.05 % ophthalmic emulsion    rosuvastatin (CRESTOR) 5 MG tablet    spironolactone (ALDACTONE) 50 MG tablet Take 1 tablet (50 mg total) by mouth daily.   [DISCONTINUED] cetirizine (ZYRTEC) 10 MG tablet Take 10 mg by mouth daily. (Patient not taking: Reported on 10/12/2021)   No facility-administered encounter medications on file as of 11/08/2021.   ALLERGIES: Allergies  Allergen Reactions   Benadryl [Diphenhydramine Hcl] Hypertension   Citalopram    Metformin And Related Diarrhea    Lost appetite and weight    Tramadol Other (See Comments)     Felt light headed and dizzy   Crestor [Rosuvastatin] Other (See Comments)    "Feet swelling", makes her feel weak   VACCINATION STATUS: Immunization History  Administered Date(s) Administered   Fluad Quad(high Dose 65+) 06/02/2019, 05/27/2020, 05/27/2021   Influenza Split 07/17/2011   Influenza Whole 06/18/2007, 07/29/2009, 05/16/2013   Influenza, High Dose Seasonal PF 08/13/2018   Influenza,inj,Quad PF,6+ Mos 05/19/2014, 08/22/2016, 05/17/2017   Moderna SARS-COV2 Booster Vaccination 01/31/2021   Moderna Sars-Covid-2 Vaccination 10/26/2019, 11/26/2019, 07/29/2020   Pneumococcal Conjugate-13 03/24/2014   Pneumococcal  Polysaccharide-23 05/20/2008   Td 06/18/2002   Tdap 07/17/2011, 12/28/2020    Diabetes She presents for her follow-up diabetic visit. She has type 2 diabetes mellitus. Onset time: She was diagnosed at approximate age of 54 years. Her disease course has been improving. There are no hypoglycemic associated symptoms. Pertinent negatives for hypoglycemia include no confusion, headaches, pallor or seizures. There are no diabetic associated symptoms. Pertinent negatives for diabetes include no chest pain, no fatigue, no polydipsia, no polyphagia and no polyuria. There are no hypoglycemic complications. Symptoms are improving. Diabetic complications include heart disease and nephropathy. Risk factors for coronary artery disease include diabetes mellitus, dyslipidemia, hypertension, sedentary lifestyle and post-menopausal. Current diabetic treatment includes insulin injections. She is compliant with treatment most of the time. Her weight is fluctuating minimally. She is following a generally unhealthy diet. When asked about meal planning, she reported none. She has had a previous visit with a dietitian. She never participates in exercise. Her home blood glucose trend is fluctuating minimally. Her breakfast blood glucose range is generally 140-180 mg/dl. Her bedtime blood glucose range is  generally 140-180 mg/dl. Her overall blood glucose range is 140-180 mg/dl. (She presents with better glycemic profile.  No significant hypoglycemia.  Her point-of-care A1c is 7.2%.     She is currently on Humalog 75/25 10 units twice daily AC. ) An ACE inhibitor/angiotensin II receptor blocker is being taken. She does not see a podiatrist.Eye exam is current.  Hyperlipidemia This is a chronic problem. The current episode started more than 1 year ago. The problem is controlled. Recent lipid tests were reviewed and are normal. Exacerbating diseases include chronic renal disease and diabetes. Factors aggravating her hyperlipidemia include beta blockers and fatty foods. Pertinent negatives include no chest pain, myalgias or shortness of breath. Current antihyperlipidemic treatment includes statins. The current treatment provides moderate improvement of lipids. Compliance problems include adherence to diet and adherence to exercise.  Risk factors for coronary artery disease include diabetes mellitus, dyslipidemia, a sedentary lifestyle, hypertension and post-menopausal.  Hypertension This is a chronic problem. The current episode started more than 1 year ago. The problem has been waxing and waning since onset. The problem is uncontrolled. Pertinent negatives include no chest pain, headaches, palpitations or shortness of breath. Risk factors for coronary artery disease include diabetes mellitus, dyslipidemia, sedentary lifestyle and post-menopausal state. Past treatments include angiotensin blockers, calcium channel blockers, beta blockers and diuretics. The current treatment provides moderate improvement. There are no compliance problems.  Hypertensive end-organ damage includes kidney disease and CAD/MI. Identifiable causes of hypertension include chronic renal disease.   Review of systems  Constitutional: + Minimally fluctuating body weight,  current Body mass index is 23.92 kg/m. , no fatigue, no subjective  hyperthermia, no subjective hypothermia    Objective:    BP 128/62    Pulse 72    Ht 5' 1"  (1.549 m)    Wt 126 lb 9.6 oz (57.4 kg)    BMI 23.92 kg/m   Wt Readings from Last 3 Encounters:  11/08/21 126 lb 9.6 oz (57.4 kg)  10/12/21 127 lb 1.3 oz (57.6 kg)  10/11/21 127 lb (57.6 kg)    BP Readings from Last 3 Encounters:  11/08/21 128/62  10/12/21 (!) 175/73  10/11/21 (!) 150/64    Physical Exam- Limited  Constitutional:  Body mass index is 23.92 kg/m. , not in acute distress, mildly anxious state of mind, forgetful at times     Results for orders placed or performed in visit  on 11/08/21  HgB A1c  Result Value Ref Range   Hemoglobin A1C     HbA1c POC (<> result, manual entry)     HbA1c, POC (prediabetic range)     HbA1c, POC (controlled diabetic range) 7.2 (A) 0.0 - 7.0 %   Complete Blood Count (Most recent): Lab Results  Component Value Date   WBC 8.7 12/27/2020   HGB 12.7 12/27/2020   HCT 40.5 12/27/2020   MCV 96.9 12/27/2020   PLT 235 12/27/2020   Chemistry (most recent): Lab Results  Component Value Date   NA 138 07/28/2021   K 4.4 07/28/2021   CL 98 07/28/2021   CO2 26 07/28/2021   BUN 23 07/28/2021   CREATININE 1.49 (H) 07/28/2021   Diabetic Labs (most recent): Lab Results  Component Value Date   HGBA1C 7.2 (A) 11/08/2021   HGBA1C 6.8 (H) 07/28/2021   HGBA1C 6.9 03/15/2021   Lipid Panel     Component Value Date/Time   CHOL 193 05/24/2021 0852   TRIG 38 05/24/2021 0852   HDL 116 05/24/2021 0852   CHOLHDL 1.7 05/24/2021 0852   CHOLHDL 1.8 06/29/2020 0916   VLDL 8 10/31/2016 0845   LDLCALC 69 05/24/2021 0852   LDLCALC 80 06/29/2020 0916     Assessment & Plan:   1) Type 2 diabetes mellitus with stage 3 chronic kidney disease, with long-term current use of insulin   She presents with better glycemic profile.  No significant hypoglycemia.  Her point-of-care A1c is 7.2%.     She is currently on Humalog 75/25 10 units twice daily  AC.  Patient remains at a high risk for more acute and chronic complications of diabetes which include CAD, CVA, CKD, retinopathy, and neuropathy. These are all discussed in detail with the patient.  - Nutritional counseling repeated at each appointment due to patients tendency to fall back in to old habits.   - she acknowledges that there is a room for improvement in her food and drink choices. - Suggestion is made for her to avoid simple carbohydrates  from her diet including Cakes, Sweet Desserts, Ice Cream, Soda (diet and regular), Sweet Tea, Candies, Chips, Cookies, Store Bought Juices, Alcohol , Artificial Sweeteners,  Coffee Creamer, and "Sugar-free" Products, Lemonade. This will help patient to have more stable blood glucose profile and potentially avoid unintended weight gain.  The following Lifestyle Medicine recommendations according to [place]  [facility]) were discussed and and offered to patient and she  agrees to start the journey:  A. Whole Foods, Plant-Based Nutrition comprising of fruits and vegetables, plant-based proteins, whole-grain carbohydrates was discussed in detail with the patient.   A list for source of those nutrients were also provided to the patient.  Patient will use only water or unsweetened tea for hydration.   - I encouraged the patient to switch to  unprocessed or minimally processed complex starch and increased protein intake (animal or plant source), fruits, and vegetables.   - Patient is advised to stick to a routine mealtimes to eat 3 meals  a day and avoid unnecessary snacks ( to snack only to correct hypoglycemia).  I have approached patient to continue on  intensive monitoring of blood glucose and insulin therapy, and patient agrees.   - She continued to do fairly well on a simplified treatment with premixed insulin, however, lately she does not always match her injections with a meal. -Avoiding hypoglycemia is the #1  priority in her case, she  does not adequate social support.  -Based on her presentation with stabilized glycemic profile, she will not need any more than 2 injections of insulin daily.   -She had a better cognitive function today.  She is advise to continue Humalog 75/25 10 units with breakfast and 10 units with supper only if her Premeal blood glucose readings are above 90 mg per DL.  She is advised to stay off of glipizide.    She is advised and willing to continue to monitor blood glucose at least twice a day-breakfast, before supper, and at any other time as needed.     She is instructed to call the clinic if she has glucose readings less than 70 or greater than 200 for 3 tests in a row.    - She is warned not to inject insulin without proper monitoring of blood glucose.  -Target numbers for A1c, LDL, HDL, Triglycerides, Waist Circumference were discussed in detail.  2) HTN:  Her BP is not controlled to target.  She is advised to continue Amlodipine 10 mg po daily, Losartan 50 mg po daily, Metoprolol 50 mg po twice daily, and Spironolactone 25 mg po daily.  May need to adjust dose at next visit if BP remains elevated.  3) Lipids:  Her most recent lipid panel from 06/29/20 shows controlled LDL of 80.  She is advised to continue Crestor 5 mg p.o. daily at bedtime.  She is also on  Lovaza 1 gm po twice daily.  Side effects and precautions discussed with patient.  4)  Weight/Diet:  Her Body mass index is 23.92 kg/m.-not a candidate for weight loss.  CDE consult in progress, exercise, and carbs information provided.   5) Chronic Care: -Patient is on ARB medications and Statins and encouraged to continue to follow up with Ophthalmology, Podiatrist at least yearly or according to recommendations, and advised to stay away from smoking. I have recommended yearly flu vaccine and pneumonia vaccination at least every 5 years; and  sleep for at least 7 hours a day.   She is advised to maintain  close follow-up with her PCP:  [name], MD   I spent 30 minutes in the care of the patient today including review of labs from [place], Lipids, Thyroid Function, Hematology (current and previous including abstractions from other facilities); face-to-face time discussing  her blood glucose readings/logs, discussing hypoglycemia and hyperglycemia episodes and symptoms, medications doses, her options of short and long term treatment based on the latest standards of care / guidelines;  discussion about incorporating lifestyle medicine;  and documenting the encounter.    Please refer to Patient Instructions for Blood Glucose Monitoring and Insulin/Medications Dosing Guide"  in media tab for additional information. Please  also refer to " Patient Self Inventory" in the Media  tab for reviewed elements of pertinent patient history.  Saquoia Hewitt Shorts participated in the discussions, expressed understanding, and voiced agreement with the above plans.  All questions were answered to her satisfaction. she is encouraged to contact clinic should she have any questions or concerns prior to her return visit.    Follow up plan: Return in about 3 months (around 02/05/2022) for Bring Meter and Logs- A1c in Office.  Rayetta Pigg, [facility] [address] [place], [place] [address] Phone: [phone] Fax: [phone]  11/08/2021, 6:11 PM

## 2021-11-14 ENCOUNTER — Ambulatory Visit (INDEPENDENT_AMBULATORY_CARE_PROVIDER_SITE_OTHER): Payer: Medicare Other

## 2021-11-14 ENCOUNTER — Other Ambulatory Visit: Payer: Self-pay

## 2021-11-14 VITALS — BP 159/67 | HR 77 | Ht 61.0 in | Wt 125.1 lb

## 2021-11-14 DIAGNOSIS — Z0000 Encounter for general adult medical examination without abnormal findings: Secondary | ICD-10-CM

## 2021-11-14 NOTE — Progress Notes (Signed)
Subjective:   Emily Jennings is a 88 y.o. female who presents for Medicare Annual Wellness.  Emily Jennings , Thank you for taking time to come for your Medicare Wellness Visit. I appreciate your ongoing commitment to your health goals. Please review the following plan we discussed and let me know if I can assist you in the future.   These are the goals we discussed:  Goals       Exercise 3x per week (30 min per time) (pt-stated)      Starting 09/29/2016 I would like to start exercising 20 minutes a day every day of the week.      HEMOGLOBIN A1C < 7.0      Cut carbs and portion sizes       Medication Management      Patient Goals/Self-Care Activities Over the next 90 days, patient will:  Focus on medication adherence by keeping up with prescription refills and either using a pill box or reminders to take your medications at the prescribed times Check blood sugar three times a day at the following times: 5-15 minutes before breakfast, 5-15 minutes before dinner, bedtime, and whenever patient experiences symptoms of hypo/hyperglycemia, document, and provide at future appointments Check blood pressure at least once daily, document, and provide at future appointments      Prevent falls      Slow down and take her time        This is a list of the screening recommended for you and due dates:  Health Maintenance  Topic Date Due   Zoster (Shingles) Vaccine (1 of 2) Never done   COVID-19 Vaccine (4 - Booster for Moderna series) 03/28/2021   Eye exam for diabetics  11/10/2021   Hemoglobin A1C  05/08/2022   Complete foot exam   10/23/2022   Tetanus Vaccine  12/29/2030   Pneumonia Vaccine  Completed   Flu Shot  Completed   DEXA scan (bone density measurement)  Completed   HPV Vaccine  Aged Out          Objective:    There were no vitals filed for this visit. There is no height or weight on file to calculate BMI.  Advanced Directives 03/15/2021 12/27/2020 11/10/2020 10/09/2018  09/03/2018 10/01/2017 09/28/2016  Does Patient Have a Medical Advance Directive? No No Yes No Yes Yes No  Type of Advance Directive - Public librarian;Living will - - - -  Copy of [place] in Chart? - - No - copy requested - - - -  Would patient like information on creating a medical advance directive? No - Patient declined No - Patient declined No - Patient declined Yes (ED - Information included in AVS) - - Yes (MAU/Ambulatory/Procedural Areas - Information given)    Current Medications (verified) Outpatient Encounter Medications as of 11/14/2021  Medication Sig   acetaminophen (TYLENOL) 500 MG tablet Take one tablet two times daily for 3 days, then as needed   alendronate (FOSAMAX) 70 MG tablet TAKE 1 TABLET BY MOUTH EVERY 7 DAYS. TAKE WITH A FULL GLASS OF WATER ON AN EMPTY STOMACH   amLODipine-olmesartan (AZOR) 10-40 MG tablet TAKE 1 TABLET BY MOUTH EVERY DAY   aspirin EC 81 MG tablet Take 81 mg by mouth daily.   benzonatate (TESSALON) 100 MG capsule Take 1 capsule (100 mg total) by mouth 2 (two) times daily as needed for cough.   blood glucose meter kit and supplies KIT One touch Ultra. Use  three times daily as directed. (FOR ICD E11.65)   carvedilol (COREG) 12.5 MG tablet Take 1 tablet (12.5 mg total) by mouth 2 (two) times daily with a meal.   cholecalciferol (VITAMIN D3) 25 MCG (1000 UT) tablet Take 2,000 Units by mouth daily.   glucose blood (ONETOUCH ULTRA) test strip USE TO TEST 3 TIMES A DAY   HUMALOG MIX 75/25 (75-25) 100 UNIT/ML SUSP injection INJECT SUBCUTANEOUSLY 10 UNITS TWICE DAILY   Insulin Pen Needle 32G X 4 MM MISC 1 each by Does not apply route as directed.   Insulin Syringe-Needle U-100 (BD INSULIN SYRINGE U/F) 31G X 5/16" 0.5 ML MISC USE AS DIRECTED TWICE DAILY   ketorolac (ACULAR) 0.5 % ophthalmic solution Place 1 drop into the left eye 4 (four) times daily.   KLOR-CON M20 20 MEQ tablet TAKE 1 TABLET BY MOUTH TWICE A DAY   losartan  (COZAAR) 100 MG tablet    Multiple Vitamins-Minerals (CENTRUM SILVER 50+WOMEN) TABS Take 1 tablet by mouth daily.   mupirocin ointment (BACTROBAN) 2 % Apply topically 2 (two) times daily.   OneTouch Delica Lancets 70B MISC 1 each by Does not apply route 3 (three) times daily. Use to check glucose three times daily   RESTASIS 0.05 % ophthalmic emulsion    rosuvastatin (CRESTOR) 5 MG tablet    spironolactone (ALDACTONE) 50 MG tablet Take 1 tablet (50 mg total) by mouth daily.   No facility-administered encounter medications on file as of 11/14/2021.    Allergies (verified) Benadryl [diphenhydramine hcl], Citalopram, Metformin and related, Tramadol, and Crestor [rosuvastatin]   History: Past Medical History:  Diagnosis Date   Allergy    Anxiety    ANXIETY DISORDER, GENERALIZED 07/15/2007   Qualifier: Diagnosis of  By: Rennie Plowman     Arthritis    Cancer [facility]) 2009   breast, carcinoma in situ left   Carcinoma in situ of breast 05/21/2008   Qualifier: Diagnosis of  By: Moshe Cipro MD, Margaret  Diagnosed in 2009, completed 5 year course of tamoxifen, no evidence of recurrence    Carotid stenosis    11/16/2005  mild plaque formation and stenosis proximal right ECA   Cataract    Complication of anesthesia    Coronary artery disease    cardiac catheterization on 03/20/2006  LAD mid 40% stenosis, left circumflex mild 40% stenosis, RCA mid-vessel 40% to 50% lesion   EF 60%   Diabetes mellitus    GERD (gastroesophageal reflux disease)    Hernia, inguinal    left   Hyperglycemia    Hypertension    Insomnia 11/16/2011   Low blood potassium    Non-insulin dependent type 2 diabetes mellitus ([place])    Osteoporosis    Shortness of breath    2D Echocardiogram 01/26/2009   EF of greater than 55%, mild MR, mild TR, normal ventricular function   Thickened endometrium 10/26/2017   Noted by gyne in 2017, missed 6 month follow up, referred in 09/2017   Ventricular tachycardia, non-sustained     developed during stress test 02/08/2006, spontaneously aborted, mild reversible apical defect   Past Surgical History:  Procedure Laterality Date   BREAST LUMPECTOMY Left 2009   Left breast 2009   CATARACT EXTRACTION W/PHACO Left 10/28/2014   Procedure: PHACO EMULSION CATARACT EXTRACTION WITH INTRAOCULAR LENS IMPLANT LEFT EYE ([place]);  Surgeon: Marylynn Pearson, MD;  Location: [facility];  Service: Ophthalmology;  Laterality: Left;   COLONOSCOPY     cyst removed from left foot  REFRACTIVE SURGERY Left    Family History  Problem Relation Age of Onset   Hypertension Mother    Hyperlipidemia Mother    Stroke Mother    Urticaria Mother    Cancer Father        pancreatic   Colon cancer Father    Heart disease Brother 38       bypass   Heart disease Brother 65       bypass   Arthritis Other    Asthma Other    Diabetes Other    Colon cancer Paternal Aunt    Esophageal cancer Neg Hx    Stomach cancer Neg Hx    Rectal cancer Neg Hx    Social History   Socioeconomic History   Marital status: Married    Spouse name: Not on file   Number of children: 2   Years of education: na   Highest education level: Some college, no degree  Occupational History   Occupation: retired    Fish farm manager: retired  Tobacco Use   Smoking status: Never   Smokeless tobacco: Never  Scientific laboratory technician Use: Never used  Substance and Sexual Activity   Alcohol use: No   Drug use: No   Sexual activity: Not Currently    Birth control/protection: Post-menopausal  Other Topics Concern   Not on file  Social History Narrative   Lives with her husband and son and attends yoga at the [facility] 2 days a week and speaks to her grandson nightly on the phone   Social Determinants of Health   Financial Resource Strain: Not on file  Food Insecurity: Not on file  Transportation Needs: Not on file  Physical Activity: Not on file  Stress: Not on file  Social Connections: Not on file    Tobacco Counseling Counseling  given: Not Answered   Clinical Intake:                 Diabetic?yes Nutrition Risk Assessment:  Has the patient had any N/V/D within the last 2 months?  Yes  Does the patient have any non-healing wounds?  No  Has the patient had any unintentional weight loss or weight gain?  No   Diabetes:  Is the patient diabetic?  Yes  If diabetic, was a CBG obtained today?  No  Did the patient bring in their glucometer from home?  No  How often do you monitor your CBG's? 4 times a day .   Financial Strains and Diabetes Management:  Are you having any financial strains with the device, your supplies or your medication? No .  Does the patient want to be seen by Chronic Care Management for management of their diabetes?  No  Would the patient like to be referred to a Nutritionist or for Diabetic Management?  No   Diabetic Exams:  Diabetic Eye Exam: Completed 11/10/20 Diabetic Foot Exam: Completed 10/23/21           Activities of Daily Living No flowsheet data found.  Patient Care Team: [name], MD as PCP - General (Family Medicine) Cassandria Anger, MD as Consulting Physician (Endocrinology) Marylynn Pearson, MD as Consulting Physician (Ophthalmology) Beryle Lathe, [facility] (Pharmacist)  Indicate any recent Medical Services you may have received from other than [facility] providers in the past year (date may be approximate).     Assessment:   This is a routine wellness examination for X[place].  Hearing/Vision screen No results found.  Dietary issues and exercise  activities discussed:     Goals Addressed   None   Depression Screen PHQ 2/9 Scores 10/12/2021 07/28/2021 06/24/2021 05/27/2021 12/09/2020 11/11/2020 11/10/2020  PHQ - 2 Score 0 0 0 0 0 0 0  PHQ- 9 Score - 0 - - - - -    Fall Risk Fall Risk  10/12/2021 07/28/2021 07/14/2021 06/24/2021 05/27/2021  Falls in the past year? 0 1 1 0 1  Number falls in past yr: 0 0 0 0 1  Injury with Fall? 0 1 0 0 1   Risk for fall due to : No Fall Risks - - - History of fall(s);Impaired balance/gait  Risk for fall due to: Comment - - - - -  Follow up Falls evaluation completed - - - Falls evaluation completed    FALL RISK PREVENTION PERTAINING TO THE HOME:  Any stairs in or around the home? Yes  If so, are there any without handrails? Yes  Home free of loose throw rugs in walkways, pet beds, electrical cords, etc? Yes  Adequate lighting in your home to reduce risk of falls? Yes   ASSISTIVE DEVICES UTILIZED TO PREVENT FALLS:  Life alert? No  Use of a cane, walker or w/c? Yes  Grab bars in the bathroom? No  Shower chair or bench in shower? No  Elevated toilet seat or a handicapped toilet? Yes        6CIT Screen 11/10/2020 11/05/2019 10/09/2018 10/01/2017 09/28/2016  What Year? 0 points 0 points 0 points 0 points 0 points  What month? 0 points 0 points 0 points 0 points 0 points  What time? 0 points 0 points 0 points 0 points 0 points  Count back from 20 0 points 0 points 0 points 2 points 0 points  Months in reverse 0 points 0 points 0 points 0 points 0 points  Repeat phrase 0 points 2 points 2 points 2 points 2 points  Total Score 0 2 2 4 2     Immunizations Immunization History  Administered Date(s) Administered   Fluad Quad(high Dose 65+) 06/02/2019, 05/27/2020, 05/27/2021   Influenza Split 07/17/2011   Influenza Whole 06/18/2007, 07/29/2009, 05/16/2013   Influenza, High Dose Seasonal PF 08/13/2018   Influenza,inj,Quad PF,6+ Mos 05/19/2014, 08/22/2016, 05/17/2017   Moderna SARS-COV2 Booster Vaccination 01/31/2021   Moderna Sars-Covid-2 Vaccination 10/26/2019, 11/26/2019, 07/29/2020   Pneumococcal Conjugate-13 03/24/2014   Pneumococcal Polysaccharide-23 05/20/2008   Td 06/18/2002   Tdap 07/17/2011, 12/28/2020    TDAP status: Up to date  Flu Vaccine status: Up to date  Pneumococcal vaccine status: Up to date  Covid-19 vaccine status: Completed vaccines  Qualifies for Shingles  Vaccine? No   Zostavax completed No   Shingrix Completed?: No.    Education has been provided regarding the importance of this vaccine. Patient has been advised to call insurance company to determine out of pocket expense if they have not yet received this vaccine. Advised may also receive vaccine at local pharmacy or Health Dept. Verbalized acceptance and understanding.  Screening Tests Health Maintenance  Topic Date Due   Zoster Vaccines- Shingrix (1 of 2) Never done   COVID-19 Vaccine (4 - Booster for Moderna series) 03/28/2021   OPHTHALMOLOGY EXAM  11/10/2021   HEMOGLOBIN A1C  05/08/2022   FOOT EXAM  10/23/2022   TETANUS/TDAP  12/29/2030   Pneumonia Vaccine 43+ Years old  Completed   INFLUENZA VACCINE  Completed   DEXA SCAN  Completed   HPV VACCINES  Aged Out    Health Maintenance  Health Maintenance Due  Topic Date Due   Zoster Vaccines- Shingrix (1 of 2) Never done   COVID-19 Vaccine (4 - Booster for Moderna series) 03/28/2021   OPHTHALMOLOGY EXAM  11/10/2021    Colorectal cancer screening: No longer required.   Mammogram status: No longer required due to age.  Bone Density status: Completed 10/18/17. Results reflect: Bone density results: OSTEOPOROSIS. Repeat every 2 years.  Lung Cancer Screening: (Low Dose CT Chest recommended if Age 60-80 years, 30 pack-year currently smoking OR have quit w/in 15years.) does not qualify.   Lung Cancer Screening Referral: no  Additional Screening:  Hepatitis C Screening: does not qualify; Completed   Vision Screening: Recommended annual ophthalmology exams for early detection of glaucoma and other disorders of the eye. Is the patient up to date with their annual eye exam?  Yes  Who is the provider or what is the name of the office in which the patient attends annual eye exams? Dr Venetia Maxon If pt is not established with a provider, would they like to be referred to a provider to establish care? No .   Dental Screening: Recommended  annual dental exams for proper oral hygiene  Community Resource Referral / Chronic Care Management: CRR required this visit?  No   CCM required this visit?  No      Plan:     I have personally reviewed and noted the following in the patients chart:   Medical and social history Use of alcohol, tobacco or illicit drugs  Current medications and supplements including opioid prescriptions.  Functional ability and status Nutritional status Physical activity Advanced directives List of other physicians Hospitalizations, surgeries, and ER visits in previous 12 months Vitals Screenings to include cognitive, depression, and falls Referrals and appointments  In addition, I have reviewed and discussed with patient certain preventive protocols, quality metrics, and best practice recommendations. A written personalized care plan for preventive services as well as general preventive health recommendations were provided to patient.     Quentin Angst, [place]   11/14/2021   Nurse Notes:

## 2021-11-14 NOTE — Patient Instructions (Signed)
Emily Jennings , Thank you for taking time to come for your Medicare Wellness Visit. I appreciate your ongoing commitment to your health goals. Please review the following plan we discussed and let me know if I can assist you in the future.   These are the goals we discussed:  Goals       Exercise 3x per week (30 min per time) (pt-stated)      Starting 09/29/2016 I would like to start exercising 20 minutes a day every day of the week.      HEMOGLOBIN A1C < 7.0      Cut carbs and portion sizes       Medication Management      Patient Goals/Self-Care Activities Over the next 90 days, patient will:  Focus on medication adherence by keeping up with prescription refills and either using a pill box or reminders to take your medications at the prescribed times Check blood sugar three times a day at the following times: 5-15 minutes before breakfast, 5-15 minutes before dinner, bedtime, and whenever patient experiences symptoms of hypo/hyperglycemia, document, and provide at future appointments Check blood pressure at least once daily, document, and provide at future appointments      Patient Stated      More exercise      Prevent falls      Slow down and take her time        This is a list of the screening recommended for you and due dates:  Health Maintenance  Topic Date Due   Zoster (Shingles) Vaccine (1 of 2) Never done   COVID-19 Vaccine (4 - Booster for Moderna series) 03/28/2021   Eye exam for diabetics  11/10/2021   Hemoglobin A1C  05/08/2022   Complete foot exam   10/23/2022   Tetanus Vaccine  12/29/2030   Pneumonia Vaccine  Completed   Flu Shot  Completed   DEXA scan (bone density measurement)  Completed   HPV Vaccine  Aged Out   Emily Jennings , Thank you for taking time to come for your Medicare Wellness Visit. I appreciate your ongoing commitment to your health goals. Please review the following plan we discussed and let me know if I can assist you in the future.   Screening  recommendations/referrals: Colonoscopy: Complete  Mammogram: Complete  Bone Density: Complete Recommended yearly ophthalmology/optometry visit for glaucoma screening and checkup Recommended yearly dental visit for hygiene and checkup  Vaccinations: Influenza vaccine: Done Pneumococcal vaccine: Done Tdap vaccine: Done  Shingles vaccine: Due now    Advanced directives: Yes   Conditions/risks identified: Diabetes  Next appointment: 1 year    Preventive Care 86 Years and Older, Female Preventive care refers to lifestyle choices and visits with your health care provider that can promote health and wellness. What does preventive care include? A yearly physical exam. This is also called an annual well check. Dental exams once or twice a year. Routine eye exams. Ask your health care provider how often you should have your eyes checked. Personal lifestyle choices, including: Daily care of your teeth and gums. Regular physical activity. Eating a healthy diet. Avoiding tobacco and drug use. Limiting alcohol use. Practicing safe sex. Taking low-dose aspirin every day. Taking vitamin and mineral supplements as recommended by your health care provider. What happens during an annual well check? The services and screenings done by your health care provider during your annual well check will depend on your age, overall health, lifestyle risk factors, and family history of  disease. Counseling  Your health care provider may ask you questions about your: Alcohol use. Tobacco use. Drug use. Emotional well-being. Home and relationship well-being. Sexual activity. Eating habits. History of falls. Memory and ability to understand (cognition). Work and work Statistician. Reproductive health. Screening  You may have the following tests or measurements: Height, weight, and BMI. Blood pressure. Lipid and cholesterol levels. These may be checked every 5 years, or more frequently if you are  over 58 years old. Skin check. Lung cancer screening. You may have this screening every year starting at age 30 if you have a 30-pack-year history of smoking and currently smoke or have quit within the past 15 years. Fecal occult blood test (FOBT) of the stool. You may have this test every year starting at age 58. Flexible sigmoidoscopy or colonoscopy. You may have a sigmoidoscopy every 5 years or a colonoscopy every 10 years starting at age 71. Hepatitis C blood test. Hepatitis B blood test. Sexually transmitted disease (STD) testing. Diabetes screening. This is done by checking your blood sugar (glucose) after you have not eaten for a while (fasting). You may have this done every 1-3 years. Bone density scan. This is done to screen for osteoporosis. You may have this done starting at age 28. Mammogram. This may be done every 1-2 years. Talk to your health care provider about how often you should have regular mammograms. Talk with your health care provider about your test results, treatment options, and if necessary, the need for more tests. Vaccines  Your health care provider may recommend certain vaccines, such as: Influenza vaccine. This is recommended every year. Tetanus, diphtheria, and acellular pertussis (Tdap, Td) vaccine. You may need a Td booster every 10 years. Zoster vaccine. You may need this after age 45. Pneumococcal 13-valent conjugate (PCV13) vaccine. One dose is recommended after age 80. Pneumococcal polysaccharide (PPSV23) vaccine. One dose is recommended after age 60. Talk to your health care provider about which screenings and vaccines you need and how often you need them. This information is not intended to replace advice given to you by your health care provider. Make sure you discuss any questions you have with your health care provider. Document Released: 10/01/2015 Document Revised: 05/24/2016 Document Reviewed: 07/06/2015 Elsevier Interactive Patient Education  2017  [place] Prevention in the Home Falls can cause injuries. They can happen to people of all ages. There are many things you can do to make your home safe and to help prevent falls. What can I do on the outside of my home? Regularly fix the edges of walkways and driveways and fix any cracks. Remove anything that might make you trip as you walk through a door, such as a raised step or threshold. Trim any bushes or trees on the path to your home. Use bright outdoor lighting. Clear any walking paths of anything that might make someone trip, such as rocks or tools. Regularly check to see if handrails are loose or broken. Make sure that both sides of any steps have handrails. Any raised decks and porches should have guardrails on the edges. Have any leaves, snow, or ice cleared regularly. Use sand or salt on walking paths during winter. Clean up any spills in your garage right away. This includes oil or grease spills. What can I do in the bathroom? Use night lights. Install grab bars by the toilet and in the tub and shower. Do not use towel bars as grab bars. Use non-skid mats or decals  in the tub or shower. If you need to sit down in the shower, use a plastic, non-slip stool. Keep the floor dry. Clean up any water that spills on the floor as soon as it happens. Remove soap buildup in the tub or shower regularly. Attach bath mats securely with double-sided non-slip rug tape. Do not have throw rugs and other things on the floor that can make you trip. What can I do in the bedroom? Use night lights. Make sure that you have a light by your bed that is easy to reach. Do not use any sheets or blankets that are too big for your bed. They should not hang down onto the floor. Have a firm chair that has side arms. You can use this for support while you get dressed. Do not have throw rugs and other things on the floor that can make you trip. What can I do in the kitchen? Clean up any spills  right away. Avoid walking on wet floors. Keep items that you use a lot in easy-to-reach places. If you need to reach something above you, use a strong step stool that has a grab bar. Keep electrical cords out of the way. Do not use floor polish or wax that makes floors slippery. If you must use wax, use non-skid floor wax. Do not have throw rugs and other things on the floor that can make you trip. What can I do with my stairs? Do not leave any items on the stairs. Make sure that there are handrails on both sides of the stairs and use them. Fix handrails that are broken or loose. Make sure that handrails are as long as the stairways. Check any carpeting to make sure that it is firmly attached to the stairs. Fix any carpet that is loose or worn. Avoid having throw rugs at the top or bottom of the stairs. If you do have throw rugs, attach them to the floor with carpet tape. Make sure that you have a light switch at the top of the stairs and the bottom of the stairs. If you do not have them, ask someone to add them for you. What else can I do to help prevent falls? Wear shoes that: Do not have high heels. Have rubber bottoms. Are comfortable and fit you well. Are closed at the toe. Do not wear sandals. If you use a stepladder: Make sure that it is fully opened. Do not climb a closed stepladder. Make sure that both sides of the stepladder are locked into place. Ask someone to hold it for you, if possible. Clearly mark and make sure that you can see: Any grab bars or handrails. First and last steps. Where the edge of each step is. Use tools that help you move around (mobility aids) if they are needed. These include: Canes. Walkers. Scooters. Crutches. Turn on the lights when you go into a dark area. Replace any light bulbs as soon as they burn out. Set up your furniture so you have a clear path. Avoid moving your furniture around. If any of your floors are uneven, fix them. If there are  any pets around you, be aware of where they are. Review your medicines with your doctor. Some medicines can make you feel dizzy. This can increase your chance of falling. Ask your doctor what other things that you can do to help prevent falls. This information is not intended to replace advice given to you by your health care provider. Make sure you  discuss any questions you have with your health care provider. Document Released: 07/01/2009 Document Revised: 02/10/2016 Document Reviewed: 10/09/2014 Elsevier Interactive Patient Education  2017 [employer].

## 2021-11-23 ENCOUNTER — Telehealth: Payer: Self-pay | Admitting: "Endocrinology

## 2021-11-23 ENCOUNTER — Encounter: Payer: Self-pay | Admitting: Family Medicine

## 2021-11-23 ENCOUNTER — Ambulatory Visit (INDEPENDENT_AMBULATORY_CARE_PROVIDER_SITE_OTHER): Payer: Medicare Other | Admitting: Family Medicine

## 2021-11-23 ENCOUNTER — Other Ambulatory Visit: Payer: Self-pay

## 2021-11-23 VITALS — BP 142/71 | HR 67 | Resp 16 | Ht 61.0 in | Wt 123.8 lb

## 2021-11-23 DIAGNOSIS — I10 Essential (primary) hypertension: Secondary | ICD-10-CM

## 2021-11-23 DIAGNOSIS — E782 Mixed hyperlipidemia: Secondary | ICD-10-CM

## 2021-11-23 DIAGNOSIS — R197 Diarrhea, unspecified: Secondary | ICD-10-CM | POA: Diagnosis not present

## 2021-11-23 DIAGNOSIS — E1122 Type 2 diabetes mellitus with diabetic chronic kidney disease: Secondary | ICD-10-CM | POA: Diagnosis not present

## 2021-11-23 DIAGNOSIS — K219 Gastro-esophageal reflux disease without esophagitis: Secondary | ICD-10-CM

## 2021-11-23 DIAGNOSIS — N1831 Chronic kidney disease, stage 3a: Secondary | ICD-10-CM

## 2021-11-23 DIAGNOSIS — Z794 Long term (current) use of insulin: Secondary | ICD-10-CM

## 2021-11-23 MED ORDER — ONETOUCH ULTRA VI STRP: ORAL_STRIP | 2 refills | Status: DC

## 2021-11-23 MED ORDER — MUPIROCIN 2 % EX OINT: TOPICAL_OINTMENT | Freq: Two times a day (BID) | CUTANEOUS | 1 refills | Status: DC

## 2021-11-23 NOTE — Telephone Encounter (Signed)
Pt states she was testing 3x a day and was changed to 4x a day and is needing a new prescription sent in stating that. ? ?One touch ultra ? ?[id number] [place]  [phone] ?Fax:  [phone] ?  ? ?

## 2021-11-23 NOTE — Telephone Encounter (Signed)
Rx sent 

## 2021-11-23 NOTE — Patient Instructions (Signed)
F/U end August, call if you need me sooner ? ?Please get fasting labs already ordered tomorrow, as we discussed ? ?Blood pressure is excellent, stay on same medication dose ? ?You are referred to GI Doc re loose stool ? ?It is important that you exercise regularly at least 30 minutes 5 times a week. If you develop chest pain, have severe difficulty breathing, or feel very tired, stop exercising immediately and seek medical attention  ? ?Thanks for choosing [facility], we consider it a privelige to serve you. ? ?

## 2021-11-23 NOTE — Assessment & Plan Note (Signed)
C/o ongoing loose stool and frequency, refer gI ?

## 2021-11-24 DIAGNOSIS — N1831 Chronic kidney disease, stage 3a: Secondary | ICD-10-CM | POA: Diagnosis not present

## 2021-11-24 DIAGNOSIS — Z794 Long term (current) use of insulin: Secondary | ICD-10-CM | POA: Diagnosis not present

## 2021-11-24 DIAGNOSIS — E1122 Type 2 diabetes mellitus with diabetic chronic kidney disease: Secondary | ICD-10-CM | POA: Diagnosis not present

## 2021-11-25 LAB — LIPID PANEL
Chol/HDL Ratio: 1.6 ratio (ref 0.0–4.4)
Cholesterol, Total: 192 mg/dL (ref 100–199)
HDL: 118 mg/dL (ref >39)
LDL Chol Calc (NIH): 65 mg/dL (ref 0–99)
Triglycerides: 43 mg/dL (ref 0–149)
VLDL Cholesterol Cal: 9 mg/dL (ref 5–40)

## 2021-11-25 LAB — CMP14+EGFR
ALT: 16 IU/L (ref 0–32)
AST: 22 IU/L (ref 0–40)
Albumin/Globulin Ratio: 2 (ref 1.2–2.2)
Albumin: 4 g/dL (ref 3.6–4.6)
Alkaline Phosphatase: 87 IU/L (ref 44–121)
BUN/Creatinine Ratio: 23 (ref 12–28)
BUN: 43 mg/dL — ABNORMAL HIGH (ref 8–27)
Bilirubin Total: 0.2 mg/dL (ref 0.0–1.2)
CO2: 23 mmol/L (ref 20–29)
Calcium: 9.4 mg/dL (ref 8.7–10.3)
Chloride: 104 mmol/L (ref 96–106)
Creatinine, Ser: 1.88 mg/dL — ABNORMAL HIGH (ref 0.57–1.00)
Globulin, Total: 2 g/dL (ref 1.5–4.5)
Glucose: 146 mg/dL — ABNORMAL HIGH (ref 70–99)
Potassium: 5.6 mmol/L — ABNORMAL HIGH (ref 3.5–5.2)
Sodium: 140 mmol/L (ref 134–144)
Total Protein: 6 g/dL (ref 6.0–8.5)
eGFR: 26 mL/min/{1.73_m2} — ABNORMAL LOW (ref >59)

## 2021-11-28 ENCOUNTER — Encounter: Payer: Self-pay | Admitting: Family Medicine

## 2021-11-28 ENCOUNTER — Encounter: Payer: Self-pay | Admitting: Gastroenterology

## 2021-11-28 NOTE — Assessment & Plan Note (Signed)
Hyperlipidemia:Low fat diet discussed and encouraged.   Lipid Panel  Lab Results  Component Value Date   CHOL 192 11/24/2021   HDL 118 11/24/2021   LDLCALC 65 11/24/2021   TRIG 43 11/24/2021   CHOLHDL 1.6 11/24/2021     Controlled, no change in medication  

## 2021-11-28 NOTE — Assessment & Plan Note (Signed)
Emily Jennings is reminded of the importance of commitment to daily physical activity for 30 minutes or more, as able and the need to limit carbohydrate intake to 30 to 60 grams per meal to help with blood sugar control.  ? ?The need to take medication as prescribed, test blood sugar as directed, and to call between visits if there is a concern that blood sugar is uncontrolled is also discussed.  ? ?Emily Jennings is reminded of the importance of daily foot exam, annual eye examination, and good blood sugar, blood pressure and cholesterol control. ?Managed by Endo and controlled ? ?Diabetic Labs Latest Ref Rng & Units 11/24/2021 11/08/2021 07/28/2021 05/24/2021 03/15/2021  ?HbA1c 0.0 - 7.0 % - 7.2(A) 6.8(H) - 6.9  ?Microalbumin mg/L - - - - -  ?Micro/Creat Ratio <30 mcg/mg creat - - - - -  ?Chol 100 - 199 mg/dL 192 - - 193 -  ?HDL >39 mg/dL 118 - - 116 -  ?Calc LDL 0 - 99 mg/dL 65 - - 69 -  ?Triglycerides 0 - 149 mg/dL 43 - - 38 -  ?Creatinine 0.57 - 1.00 mg/dL 1.88(H) - 1.49(H) 1.71(H) -  ? ?BP/Weight 11/23/2021 11/14/2021 11/08/2021 10/12/2021 10/11/2021 07/28/2021 06/24/2021  ?Systolic BP 630 160 109 323 150 156 165  ?Diastolic BP 71 67 62 73 64 66 74  ?Wt. (Lbs) 123.8 125.12 126.6 127.08 127 130.08 129  ?BMI 23.39 23.64 23.92 24.01 24 24.58 24.37  ? ?Foot/eye exam completion dates Latest Ref Rng & Units 11/10/2020 11/10/2020  ?Eye Exam No Retinopathy No Retinopathy No Retinopathy  ?Foot Form Completion - - -  ? ? ? ? ? ?

## 2021-11-28 NOTE — Assessment & Plan Note (Signed)
Marked improvement ad though not at goal, no med adjustment at this time ?DASH diet and commitment to daily physical activity for a minimum of 30 minutes discussed and encouraged, as a part of hypertension management. ?The importance of attaining a healthy weight is also discussed. ? ?BP/Weight 11/23/2021 11/14/2021 11/08/2021 10/12/2021 10/11/2021 07/28/2021 06/24/2021  ?Systolic BP 546 270 350 093 150 156 165  ?Diastolic BP 71 67 62 73 64 66 74  ?Wt. (Lbs) 123.8 125.12 126.6 127.08 127 130.08 129  ?BMI 23.39 23.64 23.92 24.01 24 24.58 24.37  ? ? ? ? ?

## 2021-11-28 NOTE — Assessment & Plan Note (Signed)
Controlled, no change in medication  

## 2021-11-28 NOTE — Progress Notes (Signed)
? ?Emily Jennings     MRN: [id number]      DOB: 1934-04-27 ? ? ?HPI ?Emily Jennings is here for follow up and re-evaluation of chronic medical conditions, medication management and review of any available recent lab and radiology data.  ?Preventive health is updated, specifically  Cancer screening and Immunization.   ?Questions or concerns regarding consultations or procedures which the PT has had in the interim are  addressed. ?The PT denies any adverse reactions to current medications since the last visit.  ?3 week h/o loose watery stool, no blood , no mucus, no N/V ?Denies polyuria, polydipsia, blurred vision , or hypoglycemic episodes. ?  ? ?ROS ?Denies recent fever or chills. ?Denies sinus pressure, nasal congestion, ear pain or sore throat. ?Denies chest congestion, productive cough or wheezing. ?Denies chest pains, palpitations and leg swelling ?   ?Denies dysuria, frequency, hesitancy or incontinence. ?Denies joint pain, swelling and limitation in mobility. ?Denies headaches, seizures, numbness, or tingling. ?Denies depression, anxiety or insomnia. ?Denies skin break down or rash. ? ? ?PE ? ?BP (!) 142/71   Pulse 67   Resp 16   Ht '5\' 1"'$  (1.549 m)   Wt 123 lb 12.8 oz (56.2 kg)   SpO2 94%   BMI 23.39 kg/m?  ? ?Patient alert and oriented and in no cardiopulmonary distress. ? ?HEENT: No facial asymmetry, EOMI,     Neck supple . ? ?Chest: Clear to auscultation bilaterally. ? ?CVS: S1, S2 no murmurs, no S3.Regular rate. ? ?ABD: Soft non tender.  ? ?Ext: No edema ? ?MS: Adequate though reduced  ROM spine, shoulders, hips and knees. ? ?Skin: Intact, no ulcerations or rash noted. ? ?Psych: Good eye contact, normal affect. Memory intact not anxious or depressed appearing. ? ?CNS: CN 2-12 intact, power,  normal throughout.no focal deficits noted. ? ? ?Assessment & Plan ? ?Diarrhea ?C/o ongoing loose stool and frequency, refer gI ? ?Essential hypertension ?Marked improvement ad though not at goal, no med adjustment at this  time ?DASH diet and commitment to daily physical activity for a minimum of 30 minutes discussed and encouraged, as a part of hypertension management. ?The importance of attaining a healthy weight is also discussed. ? ?BP/Weight 11/23/2021 11/14/2021 11/08/2021 10/12/2021 10/11/2021 07/28/2021 06/24/2021  ?Systolic BP 973 532 992 426 150 156 165  ?Diastolic BP 71 67 62 73 64 66 74  ?Wt. (Lbs) 123.8 125.12 126.6 127.08 127 130.08 129  ?BMI 23.39 23.64 23.92 24.01 24 24.58 24.37  ? ? ? ? ? ?Type 2 diabetes mellitus with stage 3a chronic kidney disease, with long-term current use of insulin ([place]) ?Emily Jennings is reminded of the importance of commitment to daily physical activity for 30 minutes or more, as able and the need to limit carbohydrate intake to 30 to 60 grams per meal to help with blood sugar control.  ? ?The need to take medication as prescribed, test blood sugar as directed, and to call between visits if there is a concern that blood sugar is uncontrolled is also discussed.  ? ?Emily Jennings is reminded of the importance of daily foot exam, annual eye examination, and good blood sugar, blood pressure and cholesterol control. ?Managed by Endo and controlled ? ?Diabetic Labs Latest Ref Rng & Units 11/24/2021 11/08/2021 07/28/2021 05/24/2021 03/15/2021  ?HbA1c 0.0 - 7.0 % - 7.2(A) 6.8(H) - 6.9  ?Microalbumin mg/L - - - - -  ?Micro/Creat Ratio <30 mcg/mg creat - - - - -  ?Chol 100 - 199  mg/dL 192 - - 193 -  ?HDL >39 mg/dL 118 - - 116 -  ?Calc LDL 0 - 99 mg/dL 65 - - 69 -  ?Triglycerides 0 - 149 mg/dL 43 - - 38 -  ?Creatinine 0.57 - 1.00 mg/dL 1.88(H) - 1.49(H) 1.71(H) -  ? ?BP/Weight 11/23/2021 11/14/2021 11/08/2021 10/12/2021 10/11/2021 07/28/2021 06/24/2021  ?Systolic BP 488 891 694 503 150 156 165  ?Diastolic BP 71 67 62 73 64 66 74  ?Wt. (Lbs) 123.8 125.12 126.6 127.08 127 130.08 129  ?BMI 23.39 23.64 23.92 24.01 24 24.58 24.37  ? ?Foot/eye exam completion dates Latest Ref Rng & Units 11/10/2020 11/10/2020  ?Eye Exam No Retinopathy No  Retinopathy No Retinopathy  ?Foot Form Completion - - -  ? ? ? ? ? ? ?Mixed hyperlipidemia ?Hyperlipidemia:Low fat diet discussed and encouraged. ? ? ?Lipid Panel  ?Lab Results  ?Component Value Date  ? CHOL 192 11/24/2021  ? HDL 118 11/24/2021  ? [place] 65 11/24/2021  ? TRIG 43 11/24/2021  ? CHOLHDL 1.6 11/24/2021  ? ? ? ?Controlled, no change in medication ? ? ?GERD (gastroesophageal reflux disease) ?Controlled, no change in medication ? ? ?

## 2021-11-30 ENCOUNTER — Other Ambulatory Visit: Payer: Self-pay

## 2021-11-30 ENCOUNTER — Telehealth: Payer: Self-pay

## 2021-11-30 DIAGNOSIS — I10 Essential (primary) hypertension: Secondary | ICD-10-CM

## 2021-11-30 NOTE — Telephone Encounter (Signed)
Patient returning call about lab results.  

## 2021-11-30 NOTE — Telephone Encounter (Signed)
LMTRC

## 2021-12-02 DIAGNOSIS — I10 Essential (primary) hypertension: Secondary | ICD-10-CM | POA: Diagnosis not present

## 2021-12-03 LAB — BMP8+EGFR
BUN/Creatinine Ratio: 23 (ref 12–28)
BUN: 36 mg/dL — ABNORMAL HIGH (ref 8–27)
CO2: 26 mmol/L (ref 20–29)
Calcium: 9.3 mg/dL (ref 8.7–10.3)
Chloride: 100 mmol/L (ref 96–106)
Creatinine, Ser: 1.57 mg/dL — ABNORMAL HIGH (ref 0.57–1.00)
Glucose: 328 mg/dL — ABNORMAL HIGH (ref 70–99)
Potassium: 5.3 mmol/L — ABNORMAL HIGH (ref 3.5–5.2)
Sodium: 137 mmol/L (ref 134–144)
eGFR: 32 mL/min/{1.73_m2} — ABNORMAL LOW (ref >59)

## 2021-12-16 ENCOUNTER — Ambulatory Visit (HOSPITAL_COMMUNITY): Payer: Medicare Other

## 2021-12-19 ENCOUNTER — Ambulatory Visit (HOSPITAL_COMMUNITY)
Admission: RE | Admit: 2021-12-19 | Discharge: 2021-12-19 | Disposition: A | Discharge status: Home / Self Care | Payer: Medicare Other | Source: Ambulatory Visit | Attending: Family Medicine | Admitting: Family Medicine

## 2021-12-19 ENCOUNTER — Other Ambulatory Visit: Payer: Self-pay | Admitting: "Endocrinology

## 2021-12-19 DIAGNOSIS — Z1231 Encounter for screening mammogram for malignant neoplasm of breast: Secondary | ICD-10-CM | POA: Diagnosis not present

## 2021-12-19 DIAGNOSIS — E1122 Type 2 diabetes mellitus with diabetic chronic kidney disease: Secondary | ICD-10-CM

## 2021-12-21 ENCOUNTER — Encounter: Payer: Self-pay | Admitting: Gastroenterology

## 2021-12-21 ENCOUNTER — Ambulatory Visit: Payer: Medicare Other | Admitting: Gastroenterology

## 2021-12-21 ENCOUNTER — Other Ambulatory Visit (INDEPENDENT_AMBULATORY_CARE_PROVIDER_SITE_OTHER): Payer: Medicare Other

## 2021-12-21 VITALS — BP 130/60 | HR 59 | Ht 61.0 in | Wt 120.0 lb

## 2021-12-21 DIAGNOSIS — R634 Abnormal weight loss: Secondary | ICD-10-CM | POA: Diagnosis not present

## 2021-12-21 DIAGNOSIS — R197 Diarrhea, unspecified: Secondary | ICD-10-CM | POA: Diagnosis not present

## 2021-12-21 DIAGNOSIS — R152 Fecal urgency: Secondary | ICD-10-CM

## 2021-12-21 LAB — TSH: TSH: 1.1 u[IU]/mL (ref 0.35–5.50)

## 2021-12-21 MED ORDER — DICYCLOMINE HCL 10 MG PO CAPS: 10.0000 mg | ORAL_CAPSULE | Freq: Three times a day (TID) | ORAL | 11 refills | Status: DC

## 2021-12-21 NOTE — Progress Notes (Signed)
? ? ?History of Present Illness: This is an 88 year old female referred by [name], MD for the evaluation of diarrhea, fecal urgency, weight loss.  She relates 2 to 3 years of intermittent difficulties with loose stools.  She generally has a bowel movement that is often urgent after meals sometimes this is loose and sometimes it is soft or formed.  She relates a decrease in appetite and 10 pound weight loss.  She has diabetes mellitus that does not appear to be under good control.  She has no specific food or beverage intolerances. Denies abdominal pain, constipation, change in stool caliber, melena, hematochezia, nausea, vomiting, dysphagia, reflux symptoms, chest pain. ? ? ?Colonoscopy 08/2018 ?- Six 6 to 8 mm polyps in the descending colon, in the transverse colon and in the ascending ?colon, removed with a cold snare. Resected and retrieved. ?- Diverticulosis in the left colon. ?- The examination was otherwise ?Path: TAs ? ? ?Allergies  ?Allergen Reactions  ? Benadryl [Diphenhydramine Hcl] Hypertension  ? Citalopram   ? Metformin And Related Diarrhea  ?  Lost appetite and weight   ? Tramadol Other (See Comments)  ?  Felt light headed and dizzy  ? Crestor [Rosuvastatin] Other (See Comments)  ?  "Feet swelling", makes her feel weak  ? ?Outpatient Medications Prior to Visit  ?Medication Sig Dispense Refill  ? acetaminophen (TYLENOL) 500 MG tablet Take one tablet two times daily for 3 days, then as needed 30 tablet 0  ? alendronate (FOSAMAX) 70 MG tablet TAKE 1 TABLET BY MOUTH EVERY 7 DAYS. TAKE WITH A FULL GLASS OF WATER ON AN EMPTY STOMACH 12 tablet 1  ? amLODipine-olmesartan (AZOR) 10-40 MG tablet TAKE 1 TABLET BY MOUTH EVERY DAY 30 tablet 3  ? aspirin EC 81 MG tablet Take 81 mg by mouth daily.    ? blood glucose meter kit and supplies KIT One touch Ultra. Use three times daily as directed. (FOR ICD E11.65) 1 each 0  ? carvedilol (COREG) 12.5 MG tablet Take 1 tablet (12.5 mg total) by mouth 2 (two)  times daily with a meal. 60 tablet 3  ? cholecalciferol (VITAMIN D3) 25 MCG (1000 UT) tablet Take 2,000 Units by mouth daily.    ? glucose blood (ONETOUCH ULTRA) test strip USE TO TEST 4 TIMES A DAY 100 strip 2  ? HUMALOG MIX 75/25 (75-25) 100 UNIT/ML SUSP injection INJECT SUBCUTANEOUSLY 10 UNITS TWICE DAILY 20 mL 0  ? Insulin Pen Needle 32G X 4 MM MISC 1 each by Does not apply route as directed. 100 each 3  ? Insulin Syringe-Needle U-100 (BD INSULIN SYRINGE U/F) 31G X 5/16" 0.5 ML MISC USE AS DIRECTED TWICE DAILY 100 each 3  ? KLOR-CON M20 20 MEQ tablet TAKE 1 TABLET BY MOUTH TWICE A DAY 180 tablet 1  ? Multiple Vitamins-Minerals (CENTRUM SILVER 50+WOMEN) TABS Take 1 tablet by mouth daily.    ? mupirocin ointment (BACTROBAN) 2 % Apply topically 2 (two) times daily. 22 g 1  ? OneTouch Delica Lancets 73S MISC 1 each by Does not apply route 3 (three) times daily. Use to check glucose three times daily 100 each 2  ? RESTASIS 0.05 % ophthalmic emulsion     ? rosuvastatin (CRESTOR) 5 MG tablet     ? spironolactone (ALDACTONE) 50 MG tablet Take 1 tablet (50 mg total) by mouth daily. 30 tablet 3  ? ?No facility-administered medications prior to visit.  ? ?Past Medical History:  ?Diagnosis Date  ?  Allergy   ? Anxiety   ? ANXIETY DISORDER, GENERALIZED 07/15/2007  ? Qualifier: Diagnosis of  By: Rennie Plowman    ? Arthritis   ? Cancer [facility]) 2009  ? breast, carcinoma in situ left  ? Carcinoma in situ of breast 05/21/2008  ? Qualifier: Diagnosis of  By: Moshe Cipro MD, Margaret  Diagnosed in 2009, completed 5 year course of tamoxifen, no evidence of recurrence   ? Carotid stenosis   ? 11/16/2005  mild plaque formation and stenosis proximal right ECA  ? Cataract   ? Complication of anesthesia   ? Coronary artery disease   ? cardiac catheterization on 03/20/2006  LAD mid 40% stenosis, left circumflex mild 40% stenosis, RCA mid-vessel 40% to 50% lesion   EF 60%  ? Diabetes mellitus   ? GERD (gastroesophageal reflux disease)   ? Hernia,  inguinal   ? left  ? Hyperglycemia   ? Hypertension   ? Insomnia 11/16/2011  ? Low blood potassium   ? Non-insulin dependent type 2 diabetes mellitus ([place])   ? Osteoporosis   ? Shortness of breath   ? 2D Echocardiogram 01/26/2009   EF of greater than 55%, mild MR, mild TR, normal ventricular function  ? Thickened endometrium 10/26/2017  ? Noted by gyne in 2017, missed 6 month follow up, referred in 09/2017  ? Ventricular tachycardia, non-sustained ([facility])   ? developed during stress test 02/08/2006, spontaneously aborted, mild reversible apical defect  ? ?Past Surgical History:  ?Procedure Laterality Date  ? BREAST LUMPECTOMY Left 2009  ? Left breast 2009  ? CATARACT EXTRACTION W/PHACO Left 10/28/2014  ? Procedure: PHACO EMULSION CATARACT EXTRACTION WITH INTRAOCULAR LENS IMPLANT LEFT EYE (IOC);  Surgeon: Marylynn Pearson, MD;  Location: [place];  Service: Ophthalmology;  Laterality: Left;  ? COLONOSCOPY    ? cyst removed from left foot    ? REFRACTIVE SURGERY Left   ? ?Social History  ? ?Socioeconomic History  ? Marital status: Married  ?  Spouse name: Not on file  ? Number of children: 2  ? Years of education: na  ? Highest education level: Some college, no degree  ?Occupational History  ? Occupation: retired  ?  Employer: retired  ?Tobacco Use  ? Smoking status: Never  ? Smokeless tobacco: Never  ?Vaping Use  ? Vaping Use: Never used  ?Substance and Sexual Activity  ? Alcohol use: No  ? Drug use: No  ? Sexual activity: Not Currently  ?  Birth control/protection: Post-menopausal  ?Other Topics Concern  ? Not on file  ?Social History Narrative  ? Lives with her husband and son and attends yoga at the [facility] 2 days a week and speaks to her grandson nightly on the phone  ? ?Social Determinants of Health  ? ?Financial Resource Strain: Low Risk   ? Difficulty of Paying Living Expenses: Not hard at all  ?Food Insecurity: No Food Insecurity  ? Worried About Charity fundraiser in the Last Year: Never true  ? Ran Out of Food in the Last  Year: Never true  ?Transportation Needs: No Transportation Needs  ? Lack of Transportation (Medical): No  ? Lack of Transportation (Non-Medical): No  ?Physical Activity: Insufficiently Active  ? Days of Exercise per Week: 7 days  ? Minutes of Exercise per Session: 20 min  ?Stress: No Stress Concern Present  ? Feeling of Stress : Only a little  ?Social Connections: Moderately Integrated  ? Frequency of Communication with Friends and Family: More than  three times a week  ? Frequency of Social Gatherings with Friends and Family: Once a week  ? Attends Religious Services: More than 4 times per year  ? Active Member of Clubs or Organizations: No  ? Attends Archivist Meetings: Never  ? Marital Status: Married  ? ?Family History  ?Problem Relation Age of Onset  ? Hypertension Mother   ? Hyperlipidemia Mother   ? Stroke Mother   ? Urticaria Mother   ? Cancer Father   ?     pancreatic  ? Colon cancer Father   ? Heart disease Brother 76  ?     bypass  ? Heart disease Brother 17  ?     bypass  ? Arthritis Other   ? Asthma Other   ? Diabetes Other   ? Colon cancer Paternal Aunt   ? Esophageal cancer Neg Hx   ? Stomach cancer Neg Hx   ? Rectal cancer Neg Hx   ? ? ? ?Review of Systems: Pertinent positive and negative review of systems were noted in the above HPI section. All other review of systems were otherwise negative. ? ? ?Physical Exam: ?General: Well developed, well nourished, no acute distress ?Head: Normocephalic and atraumatic ?Eyes: Sclerae anicteric, EOMI ?Ears: Normal auditory acuity ?Mouth: Not examined, mask on during Covid-19 pandemic ?Neck: Supple, no masses or thyromegaly ?Lungs: Clear throughout to auscultation ?Heart: Regular rate and rhythm; no murmurs, rubs or bruits ?Abdomen: Soft, non tender and non distended. No masses, hepatosplenomegaly or hernias noted. Normal Bowel sounds ?Rectal: Not done ?Musculoskeletal: Symmetrical with no gross deformities  ?Skin: No lesions on visible  extremities ?Pulses:  Normal pulses noted ?Extremities: No clubbing, cyanosis, edema or deformities noted ?Neurological: Alert oriented x 4, grossly nonfocal ?Cervical Nodes:  No significant cervical adenopathy ?[place]

## 2021-12-21 NOTE — Patient Instructions (Addendum)
Your provider has requested that you go to the basement level for lab work before leaving today. Press "B" on the elevator. The lab is located at the first door on the left as you exit the elevator. ? ? ?We have sent the following medications to your pharmacy for you to pick up at your convenience: dicyclomine 10 mg one tablet by mouth three times a day before meals. ? ?Increase your fluid intake.  ? ?You have been scheduled for a CT scan of the abdomen and pelvis at [facility] ([address]---this is in the same building as [facility]).  ? ?You are scheduled on 01/02/22 at 3:00 pm. You should arrive 15 minutes prior to your appointment time for registration. Please follow the written instructions below on the day of your exam: ? ?WARNING: IF YOU ARE ALLERGIC TO IODINE/X-RAY DYE, PLEASE NOTIFY RADIOLOGY IMMEDIATELY AT [phone]! YOU WILL BE GIVEN A 13 HOUR PREMEDICATION PREP. ? ?1) Do not eat or drink anything after 11:00 am (4 hours prior to your test) ?2) You have been given 2 bottles of oral contrast to drink. The solution may taste better if refrigerated, but do NOT add ice or any other liquid to this solution. Shake well before drinking. ?  ? Drink 1 bottle of contrast @ 1:00 pm (2 hours prior to your exam) ? Drink 1 bottle of contrast @ 2:00 pm (1 hour prior to your exam) ? ?You may take any medications as prescribed with a small amount of water, if necessary. If you take any of the following medications: METFORMIN, GLUCOPHAGE, GLUCOVANCE, AVANDAMET, RIOMET, FORTAMET, ACTOPLUS MET, JANUMET, [place] or METAGLIP, you MAY be asked to HOLD this medication 48 hours AFTER the exam. ? ?The purpose of you drinking the oral contrast is to aid in the visualization of your intestinal tract. The contrast solution may cause some diarrhea. Depending on your individual set of symptoms, you may also receive an intravenous injection of x-ray contrast/dye. Plan on being at [facility] for 30 minutes or longer, depending on the type of exam you are having performed. ? ?This test typically takes 30-45 minutes to complete. ? ?If you have any questions regarding your exam or if you need to reschedule, you may call the CT department at [phone] between the hours of 8:00 am and 5:00 pm, Monday-Friday. ? ?___________________________________________________________ ?Due to recent changes in healthcare laws, you may see the results of your imaging and laboratory studies on MyChart before your provider has had a chance to review them.  We understand that in some cases there may be results that are confusing or concerning to you. Not all laboratory results come back in the same time frame and the provider may be waiting for multiple results in order to interpret others.  Please give [place] 48 hours in order for your provider to thoroughly review all the results before contacting the office for clarification of your results.  ? ?The  GI providers would like to encourage you to use [facility] to communicate with providers for non-urgent requests or questions.  Due to long hold times on the telephone, sending your provider a message by [facility] may be a faster and more efficient way to get a response.  Please allow 48 business hours for a response.  Please remember that this is for non-urgent requests.  ? ?Thank you for choosing me and [facility]. ? ?Malcolm T. Dagoberto Ligas., MD., [facility] Regal ? ? ? ?

## 2021-12-22 ENCOUNTER — Other Ambulatory Visit: Payer: Medicare Other

## 2021-12-22 DIAGNOSIS — R634 Abnormal weight loss: Secondary | ICD-10-CM

## 2021-12-22 DIAGNOSIS — A048 Other specified bacterial intestinal infections: Secondary | ICD-10-CM | POA: Diagnosis not present

## 2021-12-22 DIAGNOSIS — R152 Fecal urgency: Secondary | ICD-10-CM

## 2021-12-22 DIAGNOSIS — R197 Diarrhea, unspecified: Secondary | ICD-10-CM | POA: Diagnosis not present

## 2021-12-22 LAB — TISSUE TRANSGLUTAMINASE, IGA: (tTG) Ab, IgA: <1 U/mL

## 2021-12-22 LAB — IGA: Immunoglobulin A: 101 mg/dL (ref 70–320)

## 2021-12-24 LAB — GI PROFILE, STOOL, PCR

## 2021-12-29 DIAGNOSIS — H35363 Drusen (degenerative) of macula, bilateral: Secondary | ICD-10-CM | POA: Diagnosis not present

## 2021-12-29 DIAGNOSIS — H2511 Age-related nuclear cataract, right eye: Secondary | ICD-10-CM | POA: Diagnosis not present

## 2021-12-29 DIAGNOSIS — H401431 Capsular glaucoma with pseudoexfoliation of lens, bilateral, mild stage: Secondary | ICD-10-CM | POA: Diagnosis not present

## 2021-12-29 DIAGNOSIS — E119 Type 2 diabetes mellitus without complications: Secondary | ICD-10-CM | POA: Diagnosis not present

## 2022-01-02 ENCOUNTER — Ambulatory Visit (INDEPENDENT_AMBULATORY_CARE_PROVIDER_SITE_OTHER)
Admission: RE | Admit: 2022-01-02 | Discharge: 2022-01-02 | Disposition: A | Discharge status: Home / Self Care | Payer: Medicare Other | Source: Ambulatory Visit | Attending: Gastroenterology | Admitting: Gastroenterology

## 2022-01-02 DIAGNOSIS — R634 Abnormal weight loss: Secondary | ICD-10-CM

## 2022-01-02 DIAGNOSIS — K529 Noninfective gastroenteritis and colitis, unspecified: Secondary | ICD-10-CM | POA: Diagnosis not present

## 2022-01-02 DIAGNOSIS — N200 Calculus of kidney: Secondary | ICD-10-CM | POA: Diagnosis not present

## 2022-01-02 DIAGNOSIS — R152 Fecal urgency: Secondary | ICD-10-CM

## 2022-01-02 DIAGNOSIS — R197 Diarrhea, unspecified: Secondary | ICD-10-CM

## 2022-01-02 DIAGNOSIS — Z853 Personal history of malignant neoplasm of breast: Secondary | ICD-10-CM | POA: Diagnosis not present

## 2022-01-04 ENCOUNTER — Other Ambulatory Visit: Payer: Self-pay | Admitting: Family Medicine

## 2022-01-11 ENCOUNTER — Other Ambulatory Visit: Payer: Self-pay | Admitting: Family Medicine

## 2022-01-19 ENCOUNTER — Other Ambulatory Visit: Payer: Self-pay | Admitting: Family Medicine

## 2022-01-20 ENCOUNTER — Other Ambulatory Visit: Payer: Self-pay | Admitting: Family Medicine

## 2022-01-26 ENCOUNTER — Other Ambulatory Visit: Payer: Self-pay | Admitting: Family Medicine

## 2022-02-06 ENCOUNTER — Ambulatory Visit: Payer: Medicare Other | Admitting: "Endocrinology

## 2022-02-07 ENCOUNTER — Ambulatory Visit: Payer: Medicare Other | Admitting: Gastroenterology

## 2022-02-07 ENCOUNTER — Encounter: Payer: Self-pay | Admitting: Gastroenterology

## 2022-02-07 VITALS — BP 158/70 | HR 70 | Ht 61.0 in | Wt 120.0 lb

## 2022-02-07 DIAGNOSIS — R197 Diarrhea, unspecified: Secondary | ICD-10-CM | POA: Diagnosis not present

## 2022-02-07 DIAGNOSIS — K5730 Diverticulosis of large intestine without perforation or abscess without bleeding: Secondary | ICD-10-CM | POA: Diagnosis not present

## 2022-02-07 NOTE — Progress Notes (Signed)
    Assessment     Constipation with intermittent diarrhea Norovirus, resolved   Recommendations    MiraLAX titrated between 1/2 capful and 1 capful qd to qod Diary to assist with determining foods and beverages to avoid that exacerbate symptoms Return to PCP for ongoing care.  GI follow-up as needed.   HPI    This is an 88 year old female returning for follow-up of diarrhea.  CT AP revealed large stool burden and colonic diverticulosis.  Stool studies revealed norovirus and was otherwise negative.  TSH, tTG IgA were normal.  Fecal calprotectin was ordered but not submitted.  She was recommended to take MiraLAX 1-2 times daily for possible overflow diarrhea.   Labs / Imaging       Latest Ref Rng & Units 11/24/2021    8:37 AM 07/28/2021   11:41 AM 05/24/2021    8:52 AM  Hepatic Function  Total Protein 6.0 - 8.5 g/dL 6.0   5.9   6.5    Albumin 3.6 - 4.6 g/dL 4.0   3.6   4.2    AST 0 - 40 IU/L $Remov'22   29   27    'oMaBtG$ ALT 0 - 32 IU/L $Remov'16   18   18    'gQiDyw$ Alk Phosphatase 44 - 121 IU/L 87   89   113    Total Bilirubin 0.0 - 1.2 mg/dL 0.2   0.2   <0.2         Latest Ref Rng & Units 12/27/2020   11:41 PM 03/24/2020    2:40 PM 05/16/2017    9:02 AM  CBC  WBC 4.0 - 10.5 K/uL 8.7   5.9   5.9    Hemoglobin 12.0 - 15.0 g/dL 12.7   12.8   12.9    Hematocrit 36.0 - 46.0 % 40.5   39.3   40.1    Platelets 150 - 400 K/uL 235   239   331      CT AP 01/03/2022 IMPRESSION: Bilateral nephrolithiasis. No evidence of ureteral calculi, hydronephrosis, or other acute findings. Colonic diverticulosis, without radiographic evidence of diverticulitis.  Stable small partially calcified uterine fibroid. Large stool burden again noted; recommend clinical correlation for possible constipation.   Current Medications, Allergies, Past Medical History, Past Surgical History, Family History and Social History were reviewed in [employer] record.    Physical Exam: General: Well developed, well  nourished, elderly, no acute distress Head: Normocephalic and atraumatic Eyes: Sclerae anicteric, EOMI Ears: Normal auditory acuity Mouth: Not examined, mask on during Covid-19 pandemic Lungs: Clear throughout to auscultation Heart: Regular rate and rhythm; no murmurs, rubs or bruits Abdomen: Soft, non tender and non distended. No masses, hepatosplenomegaly or hernias noted. Normal Bowel sounds Rectal: Not done Musculoskeletal: Symmetrical with no gross deformities  Pulses:  Normal pulses noted Extremities: No clubbing, cyanosis, edema or deformities noted Neurological: Alert oriented x 4, grossly nonfocal Psychological:  Alert and cooperative. Normal mood and affect   Ruperto Kiernan T. Fuller Plan, MD 02/07/2022, 9:55 AM

## 2022-02-07 NOTE — Patient Instructions (Signed)
Follow up with your primary care physician and [place] as needed.   Titrate your Miralax to 1/2 capful every other day to I capful daily to have a bowel movement daily.   The [facility] providers would like to encourage you to use [facility] to communicate with providers for non-urgent requests or questions.  Due to long hold times on the telephone, sending your provider a message by [facility] may be a faster and more efficient way to get a response.  Please allow 48 business hours for a response.  Please remember that this is for non-urgent requests.   Thank you for choosing me and [facility].  Pricilla Riffle. Dagoberto Ligas., MD., [facility] Regal

## 2022-02-09 ENCOUNTER — Other Ambulatory Visit: Payer: Self-pay

## 2022-02-09 ENCOUNTER — Other Ambulatory Visit: Payer: Self-pay | Admitting: "Endocrinology

## 2022-02-09 ENCOUNTER — Telehealth: Payer: Self-pay

## 2022-02-09 DIAGNOSIS — Z794 Long term (current) use of insulin: Secondary | ICD-10-CM

## 2022-02-09 MED ORDER — POTASSIUM CHLORIDE CRYS ER 20 MEQ PO TBCR: 20.0000 meq | EXTENDED_RELEASE_TABLET | Freq: Two times a day (BID) | ORAL | 1 refills | Status: DC

## 2022-02-09 NOTE — Telephone Encounter (Signed)
refilled 

## 2022-02-09 NOTE — Telephone Encounter (Signed)
Patient called need her KLOR-CON M20 20 MEQ tablet refill  Pharmacy: [facility]

## 2022-03-03 ENCOUNTER — Other Ambulatory Visit: Payer: Self-pay | Admitting: "Endocrinology

## 2022-03-03 DIAGNOSIS — N1831 Chronic kidney disease, stage 3a: Secondary | ICD-10-CM

## 2022-03-08 ENCOUNTER — Encounter: Payer: Self-pay | Admitting: "Endocrinology

## 2022-03-08 ENCOUNTER — Ambulatory Visit: Payer: Medicare Other | Admitting: "Endocrinology

## 2022-03-08 VITALS — BP 140/58 | HR 76 | Ht 61.0 in | Wt 121.0 lb

## 2022-03-08 DIAGNOSIS — E1122 Type 2 diabetes mellitus with diabetic chronic kidney disease: Secondary | ICD-10-CM | POA: Diagnosis not present

## 2022-03-08 DIAGNOSIS — I10 Essential (primary) hypertension: Secondary | ICD-10-CM | POA: Diagnosis not present

## 2022-03-08 DIAGNOSIS — Z794 Long term (current) use of insulin: Secondary | ICD-10-CM

## 2022-03-08 DIAGNOSIS — N1831 Chronic kidney disease, stage 3a: Secondary | ICD-10-CM | POA: Diagnosis not present

## 2022-03-08 DIAGNOSIS — E782 Mixed hyperlipidemia: Secondary | ICD-10-CM | POA: Diagnosis not present

## 2022-03-08 LAB — POCT GLYCOSYLATED HEMOGLOBIN (HGB A1C): HbA1c, POC (controlled diabetic range): 7.2 % — AB (ref 0.0–7.0)

## 2022-03-08 NOTE — Patient Instructions (Signed)

## 2022-03-08 NOTE — Progress Notes (Signed)
03/08/2022  Endocrinology follow-up note   Subjective:    Patient ID: Emily Jennings, female    DOB: [birth year],    Past Medical History:  Diagnosis Date   Allergy    Anxiety    ANXIETY DISORDER, GENERALIZED 07/15/2007   Qualifier: Diagnosis of  By: Rennie Plowman     Arthritis    Cancer [facility]) 2009   breast, carcinoma in situ left   Carcinoma in situ of breast 05/21/2008   Qualifier: Diagnosis of  By: Moshe Cipro MD, Margaret  Diagnosed in 2009, completed 5 year course of tamoxifen, no evidence of recurrence    Carotid stenosis    11/16/2005  mild plaque formation and stenosis proximal right ECA   Cataract    Complication of anesthesia    Coronary artery disease    cardiac catheterization on 03/20/2006  LAD mid 40% stenosis, left circumflex mild 40% stenosis, RCA mid-vessel 40% to 50% lesion   EF 60%   Diabetes mellitus    GERD (gastroesophageal reflux disease)    Hernia, inguinal    left   Hyperglycemia    Hypertension    Insomnia 11/16/2011   Low blood potassium    Non-insulin dependent type 2 diabetes mellitus ([place])    Osteoporosis    Shortness of breath    2D Echocardiogram 01/26/2009   EF of greater than 55%, mild MR, mild TR, normal ventricular function   Thickened endometrium 10/26/2017   Noted by gyne in 2017, missed 6 month follow up, referred in 09/2017   Ventricular tachycardia, non-sustained ([place])    developed during stress test 02/08/2006, spontaneously aborted, mild reversible apical defect   Past Surgical History:  Procedure Laterality Date   BREAST LUMPECTOMY Left 2009   Left breast 2009   CATARACT EXTRACTION W/PHACO Left 10/28/2014   Procedure: PHACO EMULSION CATARACT EXTRACTION WITH INTRAOCULAR LENS IMPLANT LEFT EYE ([place]);  Surgeon: Marylynn Pearson, MD;  Location: [place];  Service: Ophthalmology;  Laterality: Left;   COLONOSCOPY     cyst removed from left foot     REFRACTIVE SURGERY Left    Social History   Socioeconomic History   Marital status: Married     Spouse name: Not on file   Number of children: 2   Years of education: na   Highest education level: Some college, no degree  Occupational History   Occupation: retired    Fish farm manager: retired  Tobacco Use   Smoking status: Never   Smokeless tobacco: Never  Scientific laboratory technician Use: Never used  Substance and Sexual Activity   Alcohol use: No   Drug use: No   Sexual activity: Not Currently    Birth control/protection: Post-menopausal  Other Topics Concern   Not on file  Social History Narrative   Lives with her husband and son and attends yoga at the [facility] 2 days a week and speaks to her grandson nightly on the phone   Social Determinants of Health   Financial Resource Strain: Low Risk  (11/14/2021)   Overall Financial Resource Strain (CARDIA)    Difficulty of Paying Living Expenses: Not hard at all  Food Insecurity: No Food Insecurity (11/14/2021)   Hunger Vital Sign    Worried About Running Out of Food in the Last Year: Never true    [place] in the Last Year: Never true  Transportation Needs: No Transportation Needs (11/14/2021)   PRAPARE - Transportation    Lack of Transportation (Medical): No    Lack of  Transportation (Non-Medical): No  Physical Activity: Insufficiently Active (11/14/2021)   Exercise Vital Sign    Days of Exercise per Week: 7 days    Minutes of Exercise per Session: 20 min  Stress: No Stress Concern Present (11/14/2021)   [place]    Feeling of Stress : Only a little  Social Connections: Moderately Integrated (11/14/2021)   Social Connection and Isolation Panel [NHANES]    Frequency of Communication with Friends and Family: More than three times a week    Frequency of Social Gatherings with Friends and Family: Once a week    Attends Religious Services: More than 4 times per year    Active Member of [employer] or Organizations: No    Attends Archivist Meetings: Never    Marital  Status: Married   Outpatient Encounter Medications as of 03/08/2022  Medication Sig   acetaminophen (TYLENOL) 500 MG tablet Take one tablet two times daily for 3 days, then as needed   alendronate (FOSAMAX) 70 MG tablet TAKE 1 TABLET BY MOUTH EVERY 7 DAYS. TAKE WITH A FULL GLASS OF WATER ON AN EMPTY STOMACH   amLODipine-olmesartan (AZOR) 10-40 MG tablet TAKE 1 TABLET BY MOUTH EVERY DAY   aspirin EC 81 MG tablet Take 81 mg by mouth daily.   blood glucose meter kit and supplies KIT One touch Ultra. Use three times daily as directed. (FOR ICD E11.65)   carvedilol (COREG) 12.5 MG tablet TAKE 1 TABLET (12.5MG TOTAL) BY MOUTH TWICE A DAY WITH MEALS   cholecalciferol (VITAMIN D3) 25 MCG (1000 UT) tablet Take 2,000 Units by mouth daily.   dicyclomine (BENTYL) 10 MG capsule Take 1 capsule (10 mg total) by mouth 3 (three) times daily before meals.   glucose blood (ONETOUCH ULTRA) test strip USE TO TEST 2 TIMES A DAY   HUMALOG MIX 75/25 (75-25) 100 UNIT/ML SUSP injection INJECT SUBCUTANEOUSLY 10 UNITS TWICE DAILY   Insulin Pen Needle 32G X 4 MM MISC 1 each by Does not apply route as directed.   Insulin Syringe-Needle U-100 (BD INSULIN SYRINGE U/F) 31G X 5/16" 0.5 ML MISC USE AS DIRECTED TWICE DAILY   Multiple Vitamins-Minerals (CENTRUM SILVER 50+WOMEN) TABS Take 1 tablet by mouth daily.   mupirocin ointment (BACTROBAN) 2 % Apply topically 2 (two) times daily.   OneTouch Delica Lancets 10U MISC 1 each by Does not apply route 3 (three) times daily. Use to check glucose three times daily   potassium chloride SA (KLOR-CON M20) 20 MEQ tablet Take 1 tablet (20 mEq total) by mouth 2 (two) times daily.   RESTASIS 0.05 % ophthalmic emulsion    rosuvastatin (CRESTOR) 5 MG tablet TAKE 1 TABLET (5 MG TOTAL) BY MOUTH DAILY.   spironolactone (ALDACTONE) 50 MG tablet TAKE 1 TABLET BY MOUTH EVERY DAY   No facility-administered encounter medications on file as of 03/08/2022.   ALLERGIES: Allergies  Allergen Reactions    Benadryl [Diphenhydramine Hcl] Hypertension   Citalopram    Metformin And Related Diarrhea    Lost appetite and weight    Tramadol Other (See Comments)    Felt light headed and dizzy   Crestor [Rosuvastatin] Other (See Comments)    "Feet swelling", makes her feel weak   VACCINATION STATUS: Immunization History  Administered Date(s) Administered   Fluad Quad(high Dose 65+) 06/02/2019, 05/27/2020, 05/27/2021   Influenza Split 07/17/2011   Influenza Whole 06/18/2007, 07/29/2009, 05/16/2013   Influenza, High Dose Seasonal PF 08/13/2018  Influenza,inj,Quad PF,6+ Mos 05/19/2014, 08/22/2016, 05/17/2017   Moderna SARS-COV2 Booster Vaccination 01/31/2021   Moderna Sars-Covid-2 Vaccination 10/26/2019, 11/26/2019, 07/29/2020   Pneumococcal Conjugate-13 03/24/2014   Pneumococcal Polysaccharide-23 05/20/2008   Td 06/18/2002   Tdap 07/17/2011, 12/28/2020    Diabetes She presents for her follow-up diabetic visit. She has type 2 diabetes mellitus. Onset time: She was diagnosed at approximate age of 70 years. Her disease course has been stable. There are no hypoglycemic associated symptoms. Pertinent negatives for hypoglycemia include no confusion, headaches, pallor or seizures. There are no diabetic associated symptoms. Pertinent negatives for diabetes include no chest pain, no fatigue, no polydipsia, no polyphagia and no polyuria. There are no hypoglycemic complications. Symptoms are stable. Diabetic complications include heart disease and nephropathy. Risk factors for coronary artery disease include diabetes mellitus, dyslipidemia, hypertension, sedentary lifestyle and post-menopausal. Current diabetic treatment includes insulin injections. She is compliant with treatment most of the time. Her weight is fluctuating minimally. She is following a generally unhealthy diet. When asked about meal planning, she reported none. She has had a previous visit with a dietitian. She never participates in  exercise. Her home blood glucose trend is fluctuating minimally. Her breakfast blood glucose range is generally 140-180 mg/dl. Her bedtime blood glucose range is generally 140-180 mg/dl. Her overall blood glucose range is 140-180 mg/dl. (She presents with stable glycemic profile no hypoglycemia.  Her point-of-care A1c is 7.2% she is currently on Humalog 75/25 10 units twice daily.    ) An ACE inhibitor/angiotensin II receptor blocker is being taken. She does not see a podiatrist.Eye exam is current.  Hyperlipidemia This is a chronic problem. The current episode started more than 1 year ago. The problem is controlled. Recent lipid tests were reviewed and are normal. Exacerbating diseases include chronic renal disease and diabetes. Factors aggravating her hyperlipidemia include beta blockers and fatty foods. Pertinent negatives include no chest pain, myalgias or shortness of breath. Current antihyperlipidemic treatment includes statins. The current treatment provides moderate improvement of lipids. Compliance problems include adherence to diet and adherence to exercise.  Risk factors for coronary artery disease include diabetes mellitus, dyslipidemia, a sedentary lifestyle, hypertension and post-menopausal.  Hypertension This is a chronic problem. The current episode started more than 1 year ago. The problem has been waxing and waning since onset. The problem is uncontrolled. Pertinent negatives include no chest pain, headaches, palpitations or shortness of breath. Risk factors for coronary artery disease include diabetes mellitus, dyslipidemia, sedentary lifestyle and post-menopausal state. Past treatments include angiotensin blockers, calcium channel blockers, beta blockers and diuretics. The current treatment provides moderate improvement. There are no compliance problems.  Hypertensive end-organ damage includes kidney disease and CAD/MI. Identifiable causes of hypertension include chronic renal disease.     Review of systems  Constitutional: + Minimally fluctuating body weight,  current Body mass index is 22.86 kg/m. , no fatigue, no subjective hyperthermia, no subjective hypothermia    Objective:    BP (!) 140/58   Pulse 76   Ht _0  (1.549 m)   Wt 121 lb (54.9 kg)   BMI 22.86 kg/m   Wt Readings from Last 3 Encounters:  03/08/22 121 lb (54.9 kg)  02/07/22 120 lb (54.4 kg)  12/21/21 120 lb (54.4 kg)    BP Readings from Last 3 Encounters:  03/08/22 (!) 140/58  02/07/22 (!) 158/70  12/21/21 130/60    Physical Exam- Limited  Constitutional:  Body mass index is 22.86 kg/m. , not in acute distress, mildly anxious state  of mind, forgetful at times     Results for orders placed or performed in visit on 03/08/22  HgB A1c  Result Value Ref Range   Hemoglobin A1C     HbA1c POC (<> result, manual entry)     HbA1c, POC (prediabetic range)     HbA1c, POC (controlled diabetic range) 7.2 (A) 0.0 - 7.0 %   Complete Blood Count (Most recent): Lab Results  Component Value Date   WBC 8.7 12/27/2020   HGB 12.7 12/27/2020   HCT 40.5 12/27/2020   MCV 96.9 12/27/2020   PLT 235 12/27/2020   Chemistry (most recent): Lab Results  Component Value Date   NA 137 12/02/2021   K 5.3 (H) 12/02/2021   CL 100 12/02/2021   CO2 26 12/02/2021   BUN 36 (H) 12/02/2021   CREATININE 1.57 (H) 12/02/2021   Diabetic Labs (most recent): Lab Results  Component Value Date   HGBA1C 7.2 (A) 03/08/2022   HGBA1C 7.2 (A) 11/08/2021   HGBA1C 6.8 (H) 07/28/2021   MICROALBUR 150 11/17/2020   MICROALBUR 22.9 04/03/2019   MICROALBUR 1,218.8 (H) 12/05/2017   Lipid Panel     Component Value Date/Time   CHOL 192 11/24/2021 0837   TRIG 43 11/24/2021 0837   HDL 118 11/24/2021 0837   CHOLHDL 1.6 11/24/2021 0837   CHOLHDL 1.8 06/29/2020 0916   VLDL 8 10/31/2016 0845   LDLCALC 65 11/24/2021 0837   LDLCALC 80 06/29/2020 0916     Assessment & Plan:   1) Type 2 diabetes mellitus with stage 3  chronic kidney disease, with long-term current use of insulin   She presents with stable glycemic profile no hypoglycemia.  Her point-of-care A1c is 7.2% she is currently on Humalog 75/25 10 units twice daily.    Patient remains at a high risk for more acute and chronic complications of diabetes which include CAD, CVA, CKD, retinopathy, and neuropathy. These are all discussed in detail with the patient.  - Nutritional counseling repeated at each appointment due to patients tendency to fall back in to old habits.   - she acknowledges that there is a room for improvement in her food and drink choices. - Suggestion is made for her to avoid simple carbohydrates  from her diet including Cakes, Sweet Desserts, Ice Cream, Soda (diet and regular), Sweet Tea, Candies, Chips, Cookies, Store Bought Juices, Alcohol , Artificial Sweeteners,  Coffee Creamer, and "Sugar-free" Products, Lemonade. This will help patient to have more stable blood glucose profile and potentially avoid unintended weight gain.  The following Lifestyle Medicine recommendations according to [employer]) were discussed and and offered to patient and she  agrees to start the journey:  A. Whole Foods, Plant-Based Nutrition comprising of fruits and vegetables, plant-based proteins, whole-grain carbohydrates was discussed in detail with the patient.   A list for source of those nutrients were also provided to the patient.  Patient will use only water or unsweetened tea for hydration.   - I encouraged the patient to switch to  unprocessed or minimally processed complex starch and increased protein intake (animal or plant source), fruits, and vegetables.   - Patient is advised to stick to a routine mealtimes to eat 3 meals  a day and avoid unnecessary snacks ( to snack only to correct hypoglycemia).  I have approached patient to continue on  intensive monitoring of blood glucose and insulin therapy, and  patient agrees.   - She continued to do  fairly well on a simplified treatment with premixed insulin.    -Avoiding hypoglycemia is the #1 priority in her case, she does not adequate social support.  -Based on her presentation with stable glycemic profile, she will not need any additional intervention.  She is advised to continue Humalog 75/25 10 units with breakfast and 10 units with supper only if her [place] blood glucose readings above 90 mg per DL.    She is advised to stay off of glipizide.    She is advised and willing to continue to monitor blood glucose at least twice a day-breakfast, before supper, and at any other time as needed.     She is instructed to call the clinic if she has glucose readings less than 70 or greater than 200 for 3 tests in a row.    - She is warned not to inject insulin without proper monitoring of blood glucose.  -Target numbers for A1c, LDL, HDL, Triglycerides, Waist Circumference were discussed in detail.  2) HTN:  -Her blood pressure is controlled to target.  She is advised to continue Amlodipine 10 mg po daily, Losartan 50 mg po daily, Metoprolol 50 mg po twice daily, and Spironolactone 25 mg po daily.  May need to adjust dose at next visit if BP remains elevated.  3) Lipids:  Her most recent lipid panel from 06/29/20 shows controlled LDL of 80.  She is advised to continue Crestor 5 mg p.o. daily at bedtime.  She is also on  Lovaza 1 gm po twice daily.  Side effects and precautions discussed with patient.  4)  Weight/Diet:  Her Body mass index is 22.86 kg/m.-not a candidate for weight loss.  CDE consult in progress, exercise, and carbs information provided.   5) Chronic Care: -Patient is on ARB medications and Statins and encouraged to continue to follow up with Ophthalmology, Podiatrist at least yearly or according to recommendations, and advised to stay away from smoking. I have recommended yearly flu vaccine and pneumonia vaccination at least every 5  years; and  sleep for at least 7 hours a day.   She is advised to maintain close follow-up with her PCP:  [name], MD   I spent 35 minutes in the care of the patient today including review of labs from [facility], Lipids, Thyroid Function, Hematology (current and previous including abstractions from other facilities); face-to-face time discussing  her blood glucose readings/logs, discussing hypoglycemia and hyperglycemia episodes and symptoms, medications doses, her options of short and long term treatment based on the latest standards of care / guidelines;  discussion about incorporating lifestyle medicine;  and documenting the encounter.    Please refer to Patient Instructions for Blood Glucose Monitoring and Insulin/Medications Dosing Guide"  in media tab for additional information. Please  also refer to " Patient Self Inventory" in the Media  tab for reviewed elements of pertinent patient history.  Genise Hewitt Shorts participated in the discussions, expressed understanding, and voiced agreement with the above plans.  All questions were answered to her satisfaction. she is encouraged to contact clinic should she have any questions or concerns prior to her return visit.   Follow up plan: Return in about 4 months (around 07/08/2022) for F/U with Pre-visit Labs, Meter/CGM/Logs, A1c here.  Rayetta Pigg, [facility] [address] [place], [place] [address] Phone: [phone] Fax: [phone]  03/08/2022, 5:52 PM

## 2022-03-13 ENCOUNTER — Telehealth: Payer: Self-pay | Admitting: "Endocrinology

## 2022-03-13 NOTE — Telephone Encounter (Signed)
Pt states she was told to call us if her sugar gets over 300. She states last night before bed it was 399 and this morning it was 421. Pt was just here 6/21 Any advice?

## 2022-03-22 ENCOUNTER — Observation Stay (HOSPITAL_COMMUNITY): Payer: Medicare Other

## 2022-03-22 ENCOUNTER — Other Ambulatory Visit: Payer: Self-pay

## 2022-03-22 ENCOUNTER — Observation Stay (HOSPITAL_BASED_OUTPATIENT_CLINIC_OR_DEPARTMENT_OTHER)
Admission: EM | Admit: 2022-03-22 | Discharge: 2022-03-24 | Disposition: A | Discharge status: Home / Self Care | Payer: Medicare Other | Source: Home / Self Care | Attending: Emergency Medicine | Admitting: Emergency Medicine

## 2022-03-22 ENCOUNTER — Encounter (HOSPITAL_COMMUNITY): Payer: Self-pay

## 2022-03-22 DIAGNOSIS — E1110 Type 2 diabetes mellitus with ketoacidosis without coma: Secondary | ICD-10-CM | POA: Diagnosis not present

## 2022-03-22 DIAGNOSIS — Z79899 Other long term (current) drug therapy: Secondary | ICD-10-CM | POA: Insufficient documentation

## 2022-03-22 DIAGNOSIS — E782 Mixed hyperlipidemia: Secondary | ICD-10-CM | POA: Diagnosis present

## 2022-03-22 DIAGNOSIS — R600 Localized edema: Secondary | ICD-10-CM | POA: Diagnosis present

## 2022-03-22 DIAGNOSIS — R680 Hypothermia, not associated with low environmental temperature: Secondary | ICD-10-CM | POA: Insufficient documentation

## 2022-03-22 DIAGNOSIS — E871 Hypo-osmolality and hyponatremia: Secondary | ICD-10-CM | POA: Diagnosis not present

## 2022-03-22 DIAGNOSIS — I2510 Atherosclerotic heart disease of native coronary artery without angina pectoris: Secondary | ICD-10-CM | POA: Insufficient documentation

## 2022-03-22 DIAGNOSIS — Z853 Personal history of malignant neoplasm of breast: Secondary | ICD-10-CM | POA: Insufficient documentation

## 2022-03-22 DIAGNOSIS — Z83438 Family history of other disorder of lipoprotein metabolism and other lipidemia: Secondary | ICD-10-CM | POA: Diagnosis not present

## 2022-03-22 DIAGNOSIS — I129 Hypertensive chronic kidney disease with stage 1 through stage 4 chronic kidney disease, or unspecified chronic kidney disease: Secondary | ICD-10-CM | POA: Insufficient documentation

## 2022-03-22 DIAGNOSIS — Z7983 Long term (current) use of bisphosphonates: Secondary | ICD-10-CM | POA: Diagnosis not present

## 2022-03-22 DIAGNOSIS — Z800 Family history of malignant neoplasm of digestive organs: Secondary | ICD-10-CM | POA: Diagnosis not present

## 2022-03-22 DIAGNOSIS — T68XXXA Hypothermia, initial encounter: Secondary | ICD-10-CM | POA: Diagnosis present

## 2022-03-22 DIAGNOSIS — N1832 Chronic kidney disease, stage 3b: Secondary | ICD-10-CM | POA: Diagnosis present

## 2022-03-22 DIAGNOSIS — Z7982 Long term (current) use of aspirin: Secondary | ICD-10-CM | POA: Insufficient documentation

## 2022-03-22 DIAGNOSIS — E1010 Type 1 diabetes mellitus with ketoacidosis without coma: Secondary | ICD-10-CM | POA: Diagnosis not present

## 2022-03-22 DIAGNOSIS — R011 Cardiac murmur, unspecified: Secondary | ICD-10-CM | POA: Insufficient documentation

## 2022-03-22 DIAGNOSIS — E162 Hypoglycemia, unspecified: Secondary | ICD-10-CM

## 2022-03-22 DIAGNOSIS — Z91128 Patient's intentional underdosing of medication regimen for other reason: Secondary | ICD-10-CM | POA: Diagnosis not present

## 2022-03-22 DIAGNOSIS — I1A0 Resistant hypertension: Secondary | ICD-10-CM | POA: Diagnosis present

## 2022-03-22 DIAGNOSIS — Z888 Allergy status to other drugs, medicaments and biological substances status: Secondary | ICD-10-CM | POA: Diagnosis not present

## 2022-03-22 DIAGNOSIS — G9341 Metabolic encephalopathy: Secondary | ICD-10-CM | POA: Diagnosis not present

## 2022-03-22 DIAGNOSIS — Z823 Family history of stroke: Secondary | ICD-10-CM | POA: Diagnosis not present

## 2022-03-22 DIAGNOSIS — I10 Essential (primary) hypertension: Secondary | ICD-10-CM | POA: Diagnosis not present

## 2022-03-22 DIAGNOSIS — E1165 Type 2 diabetes mellitus with hyperglycemia: Secondary | ICD-10-CM | POA: Diagnosis not present

## 2022-03-22 DIAGNOSIS — E1122 Type 2 diabetes mellitus with diabetic chronic kidney disease: Secondary | ICD-10-CM | POA: Insufficient documentation

## 2022-03-22 DIAGNOSIS — R0602 Shortness of breath: Secondary | ICD-10-CM | POA: Diagnosis not present

## 2022-03-22 DIAGNOSIS — T383X6A Underdosing of insulin and oral hypoglycemic [antidiabetic] drugs, initial encounter: Secondary | ICD-10-CM | POA: Diagnosis not present

## 2022-03-22 DIAGNOSIS — M810 Age-related osteoporosis without current pathological fracture: Secondary | ICD-10-CM | POA: Diagnosis not present

## 2022-03-22 DIAGNOSIS — Z794 Long term (current) use of insulin: Secondary | ICD-10-CM | POA: Insufficient documentation

## 2022-03-22 DIAGNOSIS — E785 Hyperlipidemia, unspecified: Secondary | ICD-10-CM | POA: Diagnosis present

## 2022-03-22 DIAGNOSIS — E11649 Type 2 diabetes mellitus with hypoglycemia without coma: Secondary | ICD-10-CM | POA: Diagnosis not present

## 2022-03-22 DIAGNOSIS — Z8249 Family history of ischemic heart disease and other diseases of the circulatory system: Secondary | ICD-10-CM | POA: Diagnosis not present

## 2022-03-22 LAB — CBC
HCT: 36.4 % (ref 36.0–46.0)
Hemoglobin: 12.1 g/dL (ref 12.0–15.0)
MCH: 32 pg (ref 26.0–34.0)
MCHC: 33.2 g/dL (ref 30.0–36.0)
MCV: 96.3 fL (ref 80.0–100.0)
Platelets: 230 10*3/uL (ref 150–400)
RBC: 3.78 MIL/uL — ABNORMAL LOW (ref 3.87–5.11)
RDW: 13 % (ref 11.5–15.5)
WBC: 10.3 10*3/uL (ref 4.0–10.5)
nRBC: 0 % (ref 0.0–0.2)

## 2022-03-22 LAB — URINALYSIS, ROUTINE W REFLEX MICROSCOPIC
Bacteria, UA: NONE SEEN
Bilirubin Urine: NEGATIVE
Glucose, UA: 150 mg/dL — AB
Hgb urine dipstick: NEGATIVE
Ketones, ur: NEGATIVE mg/dL
Leukocytes,Ua: NEGATIVE
Nitrite: NEGATIVE
Protein, ur: >=300 mg/dL — AB
Specific Gravity, Urine: 1.01 (ref 1.005–1.030)
pH: 8 (ref 5.0–8.0)

## 2022-03-22 LAB — COMPREHENSIVE METABOLIC PANEL
ALT: 20 U/L (ref 0–44)
AST: 30 U/L (ref 15–41)
Albumin: 3.9 g/dL (ref 3.5–5.0)
Alkaline Phosphatase: 62 U/L (ref 38–126)
Anion gap: 7 (ref 5–15)
BUN: 38 mg/dL — ABNORMAL HIGH (ref 8–23)
CO2: 24 mmol/L (ref 22–32)
Calcium: 9.3 mg/dL (ref 8.9–10.3)
Chloride: 102 mmol/L (ref 98–111)
Creatinine, Ser: 1.72 mg/dL — ABNORMAL HIGH (ref 0.44–1.00)
GFR, Estimated: 29 mL/min — ABNORMAL LOW (ref >60)
Glucose, Bld: 269 mg/dL — ABNORMAL HIGH (ref 70–99)
Potassium: 4.4 mmol/L (ref 3.5–5.1)
Sodium: 133 mmol/L — ABNORMAL LOW (ref 135–145)
Total Bilirubin: 0.6 mg/dL (ref 0.3–1.2)
Total Protein: 6.8 g/dL (ref 6.5–8.1)

## 2022-03-22 LAB — BRAIN NATRIURETIC PEPTIDE: B Natriuretic Peptide: 193 pg/mL — ABNORMAL HIGH (ref 0.0–100.0)

## 2022-03-22 LAB — CBG MONITORING, ED
Glucose-Capillary: 148 mg/dL — ABNORMAL HIGH (ref 70–99)
Glucose-Capillary: 100 mg/dL — ABNORMAL HIGH (ref 70–99)
Glucose-Capillary: 96 mg/dL (ref 70–99)
Glucose-Capillary: 109 mg/dL — ABNORMAL HIGH (ref 70–99)
Glucose-Capillary: 139 mg/dL — ABNORMAL HIGH (ref 70–99)

## 2022-03-22 LAB — HEMOGLOBIN A1C
Hgb A1c MFr Bld: 7.2 % — ABNORMAL HIGH (ref 4.8–5.6)
Mean Plasma Glucose: 159.94 mg/dL

## 2022-03-22 LAB — GLUCOSE, CAPILLARY: Glucose-Capillary: 209 mg/dL — ABNORMAL HIGH (ref 70–99)

## 2022-03-22 LAB — TSH: TSH: 2.057 u[IU]/mL (ref 0.350–4.500)

## 2022-03-22 MED ORDER — CYCLOSPORINE 0.05 % OP EMUL
1.0000 [drp] | Freq: Every evening | OPHTHALMIC | Status: DC
Start: 2022-03-22 — End: 2022-03-24
  Administered 2022-03-22 – 2022-03-23 (×2): 1 [drp] via OPHTHALMIC
  Filled 2022-03-22: qty 30

## 2022-03-22 MED ORDER — SPIRONOLACTONE 25 MG PO TABS
50.0000 mg | ORAL_TABLET | Freq: Every day | ORAL | Status: DC
  Administered 2022-03-23 – 2022-03-24 (×2): 50 mg via ORAL
  Filled 2022-03-22 (×2): qty 2

## 2022-03-22 MED ORDER — DEXTROSE 50 % IV SOLN: 1.0000 | INTRAVENOUS | Status: DC | PRN

## 2022-03-22 MED ORDER — ALBUTEROL SULFATE (2.5 MG/3ML) 0.083% IN NEBU: 2.5000 mg | INHALATION_SOLUTION | Freq: Four times a day (QID) | RESPIRATORY_TRACT | Status: DC | PRN

## 2022-03-22 MED ORDER — SODIUM CHLORIDE 0.9% FLUSH
3.0000 mL | Freq: Two times a day (BID) | INTRAVENOUS | Status: DC
  Administered 2022-03-22 – 2022-03-24 (×3): 3 mL via INTRAVENOUS

## 2022-03-22 MED ORDER — ROSUVASTATIN CALCIUM 10 MG PO TABS
5.0000 mg | ORAL_TABLET | Freq: Every day | ORAL | Status: DC
  Administered 2022-03-23 – 2022-03-24 (×2): 5 mg via ORAL
  Filled 2022-03-22 (×2): qty 1

## 2022-03-22 MED ORDER — HYDRALAZINE HCL 20 MG/ML IJ SOLN: 10.0000 mg | INTRAMUSCULAR | Status: DC | PRN

## 2022-03-22 MED ORDER — NALOXONE HCL 4 MG/0.1ML NA LIQD
1.0000 | Freq: Once | NASAL | Status: AC
  Administered 2022-03-22: 1 via NASAL

## 2022-03-22 MED ORDER — CARVEDILOL 12.5 MG PO TABS
12.5000 mg | ORAL_TABLET | Freq: Two times a day (BID) | ORAL | Status: DC
  Administered 2022-03-22 – 2022-03-24 (×4): 12.5 mg via ORAL
  Filled 2022-03-22 (×3): qty 1

## 2022-03-22 MED ORDER — ENOXAPARIN SODIUM 30 MG/0.3ML IJ SOSY
30.0000 mg | PREFILLED_SYRINGE | INTRAMUSCULAR | Status: DC
  Administered 2022-03-22 – 2022-03-23 (×2): 30 mg via SUBCUTANEOUS
  Filled 2022-03-22 (×2): qty 0.3

## 2022-03-22 MED ORDER — SODIUM CHLORIDE 0.9% FLUSH
3.0000 mL | Freq: Two times a day (BID) | INTRAVENOUS | Status: DC
  Administered 2022-03-22 – 2022-03-24 (×4): 3 mL via INTRAVENOUS

## 2022-03-22 MED ORDER — ASPIRIN 81 MG PO TBEC
81.0000 mg | DELAYED_RELEASE_TABLET | Freq: Every day | ORAL | Status: DC
  Administered 2022-03-22 – 2022-03-24 (×3): 81 mg via ORAL
  Filled 2022-03-22 (×3): qty 1

## 2022-03-22 MED ORDER — SODIUM CHLORIDE 0.9% FLUSH: 3.0000 mL | INTRAVENOUS | Status: DC | PRN

## 2022-03-22 MED ORDER — AMLODIPINE-OLMESARTAN 10-40 MG PO TABS: 1.0000 | ORAL_TABLET | Freq: Every day | ORAL | Status: DC

## 2022-03-22 MED ORDER — IRBESARTAN 150 MG PO TABS
300.0000 mg | ORAL_TABLET | Freq: Every day | ORAL | Status: DC
  Administered 2022-03-23 – 2022-03-24 (×2): 300 mg via ORAL
  Filled 2022-03-22 (×2): qty 2

## 2022-03-22 MED ORDER — SODIUM CHLORIDE 0.9 % IV SOLN: 250.0000 mL | INTRAVENOUS | Status: DC | PRN

## 2022-03-22 MED ORDER — DEXTROSE 10 % IV SOLN: Freq: Once | INTRAVENOUS | Status: AC

## 2022-03-22 MED ORDER — DICYCLOMINE HCL 10 MG PO CAPS
10.0000 mg | ORAL_CAPSULE | Freq: Three times a day (TID) | ORAL | Status: DC
  Administered 2022-03-23 (×3): 10 mg via ORAL
  Filled 2022-03-22 (×3): qty 1

## 2022-03-22 MED ORDER — AMLODIPINE BESYLATE 5 MG PO TABS
10.0000 mg | ORAL_TABLET | Freq: Every day | ORAL | Status: DC
  Administered 2022-03-23 – 2022-03-24 (×2): 10 mg via ORAL
  Filled 2022-03-22 (×2): qty 2

## 2022-03-22 MED ORDER — ACETAMINOPHEN 325 MG PO TABS: 650.0000 mg | ORAL_TABLET | Freq: Four times a day (QID) | ORAL | Status: DC | PRN

## 2022-03-22 MED ORDER — ACETAMINOPHEN 650 MG RE SUPP: 650.0000 mg | Freq: Four times a day (QID) | RECTAL | Status: DC | PRN

## 2022-03-22 MED ORDER — DEXTROSE 50 % IV SOLN
INTRAVENOUS | Status: AC
  Administered 2022-03-22: 50 mL
  Filled 2022-03-22: qty 50

## 2022-03-22 NOTE — ED Notes (Signed)
Temp 98.4 oral, bearhugger turned off.

## 2022-03-22 NOTE — ED Triage Notes (Signed)
Tomi Bamberger, MD @ bedside to assess pt.

## 2022-03-22 NOTE — ED Triage Notes (Signed)
Pt BIB family, called for a stretcher to assist pt out of vehicle. Pt AMS with labored, uneven respirations. CBG read 16.

## 2022-03-22 NOTE — H&P (Signed)
History and Physical    Patient: Emily Jennings TML:[id number] DOB: 1933/11/05 DOA: 03/22/2022 DOS: the patient was seen and examined on 03/22/2022 PCP: [name], MD  Patient coming from: POV   Chief Complaint:  Chief Complaint  Patient presents with   Hypoglycemia    CBG 16 upon arrival   HPI: Emily Jennings is a 88 y.o. female with medical history significant of hypertension, diabetes mellitus type 2, breast cancer s/p lumpectomy, anxiety, and osteoporosis who presents with after being being noted to be altered. She had recently followed up with her endocrinologist Dr. Dorris Fetch on 6/21 in the office.  Review of telephone conversation on 6/26 it appeared that the patient had been advised to increase Humalog 75/25 insulin to 15 units twice daily if glucose is above 90, and she is eating 3 well-balanced meals per day.  At this time patient is alert and oriented.  She recalls that her blood sugar was 120 and she had taken 15 units of the Humalog 75/25 units prior to lunch.  She recalls eating breakfast and thinks she ate lunch as well.  Family found her at around 1 PM and noted that she was altered and not responding like normal.  Patient makes note that she never had any issues when her blood sugar  On admission to the emergency department patient was noted to have a CBG of 16 and rectal temperature 92.1 F.  Patient had been given Narcan, amp of D50, and started on D10 infusion.  Blood sugars improved and patient mental status started to clear.   Review of Systems: As mentioned in the history of present illness. All other systems reviewed and are negative. Past Medical History:  Diagnosis Date   Allergy    Anxiety    ANXIETY DISORDER, GENERALIZED 07/15/2007   Qualifier: Diagnosis of  By: Rennie Plowman     Arthritis    Cancer [facility]) 2009   breast, carcinoma in situ left   Carcinoma in situ of breast 05/21/2008   Qualifier: Diagnosis of  By: Moshe Cipro MD, Margaret  Diagnosed in 2009,  completed 5 year course of tamoxifen, no evidence of recurrence    Carotid stenosis    11/16/2005  mild plaque formation and stenosis proximal right ECA   Cataract    Complication of anesthesia    Coronary artery disease    cardiac catheterization on 03/20/2006  LAD mid 40% stenosis, left circumflex mild 40% stenosis, RCA mid-vessel 40% to 50% lesion   EF 60%   Diabetes mellitus    GERD (gastroesophageal reflux disease)    Hernia, inguinal    left   Hyperglycemia    Hypertension    Insomnia 11/16/2011   Low blood potassium    Non-insulin dependent type 2 diabetes mellitus ([place])    Osteoporosis    Shortness of breath    2D Echocardiogram 01/26/2009   EF of greater than 55%, mild MR, mild TR, normal ventricular function   Thickened endometrium 10/26/2017   Noted by gyne in 2017, missed 6 month follow up, referred in 09/2017   Ventricular tachycardia, non-sustained ([place])    developed during stress test 02/08/2006, spontaneously aborted, mild reversible apical defect   Past Surgical History:  Procedure Laterality Date   BREAST LUMPECTOMY Left 2009   Left breast 2009   CATARACT EXTRACTION W/PHACO Left 10/28/2014   Procedure: PHACO EMULSION CATARACT EXTRACTION WITH INTRAOCULAR LENS IMPLANT LEFT EYE ([place]);  Surgeon: Marylynn Pearson, MD;  Location: [facility];  Service: Ophthalmology;  Laterality: Left;   COLONOSCOPY     cyst removed from left foot     REFRACTIVE SURGERY Left    Social History:  reports that she has never smoked. She has never used smokeless tobacco. She reports that she does not drink alcohol and does not use drugs.  Allergies  Allergen Reactions   Benadryl [Diphenhydramine Hcl] Hypertension   Citalopram    Metformin And Related Diarrhea    Lost appetite and weight    Tramadol Other (See Comments)    Felt light headed and dizzy   Crestor [Rosuvastatin] Other (See Comments)    "Feet swelling", makes her feel weak    Family History  Problem Relation Age of Onset    Hypertension Mother    Hyperlipidemia Mother    Stroke Mother    Urticaria Mother    Cancer Father        pancreatic   Colon cancer Father    Heart disease Brother 41       bypass   Heart disease Brother 16       bypass   Arthritis Other    Asthma Other    Diabetes Other    Colon cancer Paternal Aunt    Esophageal cancer Neg Hx    Stomach cancer Neg Hx    Rectal cancer Neg Hx     Prior to Admission medications   Medication Sig Start Date End Date Taking? Authorizing Provider  acetaminophen (TYLENOL) 500 MG tablet Take one tablet two times daily for 3 days, then as needed 08/27/18   [name], MD  alendronate (FOSAMAX) 70 MG tablet TAKE 1 TABLET BY MOUTH EVERY 7 DAYS. TAKE WITH A FULL GLASS OF WATER ON AN EMPTY STOMACH 01/04/22   [name], MD  amLODipine-olmesartan (AZOR) 10-40 MG tablet TAKE 1 TABLET BY MOUTH EVERY DAY 01/19/22   [name], MD  aspirin EC 81 MG tablet Take 81 mg by mouth daily.    [provider]  blood glucose meter kit and supplies KIT One touch Ultra. Use three times daily as directed. (FOR ICD E11.65) 10/27/20   Cassandria Anger, MD  carvedilol (COREG) 12.5 MG tablet TAKE 1 TABLET (12.5MG TOTAL) BY MOUTH TWICE A DAY WITH MEALS 01/20/22   [name], MD  cholecalciferol (VITAMIN D3) 25 MCG (1000 UT) tablet Take 2,000 Units by mouth daily.    [provider]  dicyclomine (BENTYL) 10 MG capsule Take 1 capsule (10 mg total) by mouth 3 (three) times daily before meals. 12/21/21   Ladene Artist, MD  glucose blood (ONETOUCH ULTRA) test strip USE TO TEST 2 TIMES A DAY 02/09/22   Cassandria Anger, MD  HUMALOG MIX 75/25 (75-25) 100 UNIT/ML SUSP injection INJECT SUBCUTANEOUSLY 10 UNITS TWICE DAILY 03/06/22   Cassandria Anger, MD  Insulin Pen Needle 32G X 4 MM MISC 1 each by Does not apply route as directed. 01/22/20   Cassandria Anger, MD  Insulin Syringe-Needle U-100 (BD INSULIN SYRINGE U/F) 31G X 5/16"  0.5 ML MISC USE AS DIRECTED TWICE DAILY 01/26/20   [name], [name], MD  Multiple Vitamins-Minerals (CENTRUM SILVER 50+WOMEN) TABS Take 1 tablet by mouth daily.    [provider]  mupirocin ointment (BACTROBAN) 2 % Apply topically 2 (two) times daily. 11/23/21   [name], MD  OneTouch Delica Lancets 11H MISC 1 each by Does not apply route 3 (three) times daily. Use to check glucose three times daily  12/06/20   Brita Romp, NP  potassium chloride SA (KLOR-CON M20) 20 MEQ tablet Take 1 tablet (20 mEq total) by mouth 2 (two) times daily. 02/09/22   [name], MD  RESTASIS 0.05 % ophthalmic emulsion  06/10/20   [provider]  rosuvastatin (CRESTOR) 5 MG tablet TAKE 1 TABLET (5 MG TOTAL) BY MOUTH DAILY. 01/19/22   [name], MD  spironolactone (ALDACTONE) 50 MG tablet TAKE 1 TABLET BY MOUTH EVERY DAY 01/11/22   [name], MD    Physical Exam: Vitals:   03/22/22 1606 03/22/22 1630 03/22/22 1700 03/22/22 1730  BP: (!) 172/84 (!) 203/64 (!) 180/55 137/67  Pulse: 69 70 (!) 59 (!) 57  Resp: _0 Temp: (!) 92.1 F (33.4 C)     TempSrc: Rectal     SpO2: 94% 100% 100% 100%   Exam  Constitutional: Elderly female currently in no acute distress Eyes: PERRL, lids and conjunctivae normal ENMT: Mucous membranes are moist. Posterior pharynx clear of any exudate or lesions.  Neck: normal, supple. Respiratory: clear to auscultation bilaterally, no wheezing, no crackles. Normal respiratory effort. No accessory muscle use.  Cardiovascular: Regular rate and rhythm, positive systolic ejection murmur 0-8/6.   1+ pitting lower extremity edema noted bilaterally..  2+ pedal pulses.  Abdomen: no tenderness, no masses palpated. Bowel sounds positive.  Musculoskeletal: no clubbing / cyanosis. No joint deformity upper and lower extremities. Good ROM, no contractures. Normal muscle tone.  Skin: no rashes, lesions, ulcers. No induration Neurologic:  CN 2-12 grossly intact.  Strength 5/5 in all 4.  Psychiatric: Normal judgment and insight. Alert and oriented x 3. Normal mood.    Data Reviewed:  Reviewed labs and imaging as noted above in HPI  Assessment and Plan:  Uncontrolled diabetes mellitus type 2, with hypoglycemia Acute.  On admission patient was noted to have initial glucose of 16.  Last hemoglobin A1c 7.2 on 03/08/2022.  Home regimen of Humalog 75/25 insulin 15 units twice daily recently increased on 6/26 by her endocrinologist. -Admit to a progressive bed, but was able to be downgraded to telemetry -Hypoglycemic protocol -Continue D10 IV fluids -CBGs every 4 hours -Nursing care order placed to stop D10 IV fluids and transition to a sensitive sliding scale insulin once CBGs greater than 180 for 2 consecutive checks 4 hours apart. -Diabetic education consulted -May benefit from transitioning back to previous regimen of Humalog 75/25 insulin 10 units in interim until able to follow back up with her endocrinologist.  Acute metabolic encephalopathy Resolving.  Patient was noted to be acutely altered.  TSH within normal limits 2.057.  Symptoms thought secondary to hypoglycemia as her mentation has subsequently cleared. -Neurochecks every 4 hours -Check urine analysis  Hypothermia Resolved.  Thought secondary to the hypoglycemia.  Patient's temperature improved on Bair hugger and is currently 98.4 F.  Rule out possibility of infection. -Check urinalysis  Essential hypertension Home blood pressure regimen includes amlodipine-olmesartan 10-40 mg daily, Coreg 12.5 mg twice daily, and spironolactone 50 mg daily -Continue home regimen -Hydralazine IV as needed elevated blood pressure  Heart murmur lower extremity swelling Patient was noted to have a systolic murmur both lower extremity swelling.  She was placed on nasal cannula oxygen, but never documented to be hypoxic.  No significant JVD appreciated at this time.  Family  history is significant for heart disease. -Check chest x-ray -Add on BNP -Patient may need further work-up in the outpatient setting including echocardiogram  CKD stage IIIb /IV On admission patient presents with creatinine 1.72 with BUN 38.  Baseline creatinine appears to be relatively around this range. -Continue to monitor  Mixed hyperlipidemia -Continue Crestor  DVT prophylaxis: Lovenox Advance Care Planning:   Code Status: Full Code   Consults: None  Family Communication: Son updated at bedside  Severity of Illness: The appropriate patient status for this patient is OBSERVATION. Observation status is judged to be reasonable and necessary in order to provide the required intensity of service to ensure the patient's safety. The patient's presenting symptoms, physical exam findings, and initial radiographic and laboratory data in the context of their medical condition is felt to place them at decreased risk for further clinical deterioration. Furthermore, it is anticipated that the patient will be medically stable for discharge from the hospital within 2 midnights of admission.   Author: Norval Morton, MD 03/22/2022 6:21 PM  For on call review [email or web address].

## 2022-03-22 NOTE — ED Provider Notes (Signed)
[facility] EMERGENCY DEPARTMENT Provider Note   CSN: [id number] Arrival date & time: 03/22/22  1549     History  Chief complaint: Altered mental status.    Emily Jennings is a 88 y.o. female.  HPI Patient was brought in by private vehicle to the emergency department.  Family told the nurse that the patient has been acting this way since about 1:00.  Patient was unresponsive on arrival breathing spontaneously.  Patient not answering any questions, not following any commands Family reports that patient was fine when she woke up this morning.  She took her insulin this morning.  She did eat breakfast.  Family states they did check her blood sugar 1 time earlier today and it was low.  They gave her some Ensure.  Her mental status never really improved but her blood sugar when it was rechecked was better.  With her persistent diminished mental status they brought her to the ED    Home Medications Prior to Admission medications   Medication Sig Start Date End Date Taking? Authorizing Provider  acetaminophen (TYLENOL) 500 MG tablet Take one tablet two times daily for 3 days, then as needed 08/27/18   [name], MD  alendronate (FOSAMAX) 70 MG tablet TAKE 1 TABLET BY MOUTH EVERY 7 DAYS. TAKE WITH A FULL GLASS OF WATER ON AN EMPTY STOMACH 01/04/22   [name], MD  amLODipine-olmesartan (AZOR) 10-40 MG tablet TAKE 1 TABLET BY MOUTH EVERY DAY 01/19/22   [name], MD  aspirin EC 81 MG tablet Take 81 mg by mouth daily.    [provider]  blood glucose meter kit and supplies KIT One touch Ultra. Use three times daily as directed. (FOR ICD E11.65) 10/27/20   Cassandria Anger, MD  carvedilol (COREG) 12.5 MG tablet TAKE 1 TABLET (12.5MG TOTAL) BY MOUTH TWICE A DAY WITH MEALS 01/20/22   [name], MD  cholecalciferol (VITAMIN D3) 25 MCG (1000 UT) tablet Take 2,000 Units by mouth daily.    [provider]  dicyclomine (BENTYL) 10 MG capsule Take 1  capsule (10 mg total) by mouth 3 (three) times daily before meals. 12/21/21   Ladene Artist, MD  glucose blood (ONETOUCH ULTRA) test strip USE TO TEST 2 TIMES A DAY 02/09/22   Cassandria Anger, MD  HUMALOG MIX 75/25 (75-25) 100 UNIT/ML SUSP injection INJECT SUBCUTANEOUSLY 10 UNITS TWICE DAILY 03/06/22   Cassandria Anger, MD  Insulin Pen Needle 32G X 4 MM MISC 1 each by Does not apply route as directed. 01/22/20   Cassandria Anger, MD  Insulin Syringe-Needle U-100 (BD INSULIN SYRINGE U/F) 31G X 5/16" 0.5 ML MISC USE AS DIRECTED TWICE DAILY 01/26/20   [name], [name], MD  Multiple Vitamins-Minerals (CENTRUM SILVER 50+WOMEN) TABS Take 1 tablet by mouth daily.    [provider]  mupirocin ointment (BACTROBAN) 2 % Apply topically 2 (two) times daily. 11/23/21   [name], MD  OneTouch Delica Lancets 54T MISC 1 each by Does not apply route 3 (three) times daily. Use to check glucose three times daily 12/06/20   Brita Romp, NP  potassium chloride SA (KLOR-CON M20) 20 MEQ tablet Take 1 tablet (20 mEq total) by mouth 2 (two) times daily. 02/09/22   [name], MD  RESTASIS 0.05 % ophthalmic emulsion  06/10/20   [provider]  rosuvastatin (CRESTOR) 5 MG tablet TAKE 1 TABLET (5 MG TOTAL) BY MOUTH DAILY. 01/19/22  [name], MD  spironolactone (ALDACTONE) 50 MG tablet TAKE 1 TABLET BY MOUTH EVERY DAY 01/11/22   [name], MD      Allergies    Benadryl [diphenhydramine hcl], Citalopram, Metformin and related, Tramadol, and Crestor [rosuvastatin]    Review of Systems   Review of Systems  Physical Exam Updated Vital Signs BP 137/67   Pulse (!) 57   Temp (!) 92.1 F (33.4 C) (Rectal)   Resp 10   SpO2 100%  Physical Exam Vitals and nursing note reviewed.  Constitutional:      Appearance: She is well-developed. She is ill-appearing and diaphoretic.  HENT:     Head: Normocephalic and atraumatic.     Right Ear: External ear  normal.     Left Ear: External ear normal.  Eyes:     General: No scleral icterus.       Right eye: No discharge.        Left eye: No discharge.     Conjunctiva/sclera: Conjunctivae normal.  Neck:     Trachea: No tracheal deviation.  Cardiovascular:     Rate and Rhythm: Normal rate and regular rhythm.  Pulmonary:     Effort: Pulmonary effort is normal. No respiratory distress.     Breath sounds: Normal breath sounds. No stridor. No wheezing or rales.  Abdominal:     General: Bowel sounds are normal. There is no distension.     Palpations: Abdomen is soft.     Tenderness: There is no abdominal tenderness. There is no guarding or rebound.  Musculoskeletal:        General: No tenderness or deformity.     Cervical back: Neck supple.  Skin:    General: Skin is warm.     Findings: No rash.  Neurological:     GCS: GCS eye subscore is 4. GCS verbal subscore is 1. GCS motor subscore is 5.     Motor: Abnormal muscle tone present. No seizure activity.     Comments: No facial droop noted, pt not answering any questions, not following commands  Psychiatric:        Mood and Affect: Mood normal.    ED Results / Procedures / Treatments   Labs (all labs ordered are listed, but only abnormal results are displayed) Labs Reviewed  COMPREHENSIVE METABOLIC PANEL - Abnormal; Notable for the following components:      Result Value   Sodium 133 (*)    Glucose, Bld 269 (*)    BUN 38 (*)    Creatinine, Ser 1.72 (*)    GFR, Estimated 29 (*)    All other components within normal limits  CBC - Abnormal; Notable for the following components:   RBC 3.78 (*)    All other components within normal limits  CBG MONITORING, ED - Abnormal; Notable for the following components:   Glucose-Capillary 148 (*)    All other components within normal limits  CBG MONITORING, ED - Abnormal; Notable for the following components:   Glucose-Capillary 100 (*)    All other components within normal limits  TSH  CBG  MONITORING, ED  CBG MONITORING, ED    EKG None  Radiology No results found.  Procedures .Critical Care  Performed by: Dorie Rank, MD Authorized by: Dorie Rank, MD   Critical care provider statement:    Critical care time (minutes):  35   Critical care was time spent personally by me on the following activities:  Development of treatment plan with patient  or surrogate, discussions with consultants, evaluation of patient's response to treatment, examination of patient, ordering and review of laboratory studies, ordering and review of radiographic studies, ordering and performing treatments and interventions, pulse oximetry, re-evaluation of patient's condition and review of old charts .1-3 Lead EKG Interpretation  Performed by: Dorie Rank, MD Authorized by: Dorie Rank, MD     Interpretation: normal     ECG rate:  70   ECG rate assessment: normal     Rhythm: sinus rhythm     Ectopy: none     Conduction: normal       Medications Ordered in ED Medications  sodium chloride flush (NS) 0.9 % injection 3 mL (has no administration in time range)  sodium chloride flush (NS) 0.9 % injection 3 mL (has no administration in time range)  0.9 %  sodium chloride infusion (has no administration in time range)  dextrose 50 % solution (50 mLs  Given 03/22/22 1554)  naloxone (NARCAN) nasal spray 4 mg/0.1 mL (1 spray Nasal Provided for home use 03/22/22 1551)  dextrose 10 % infusion ( Intravenous New Bag/Given 03/22/22 1617)    ED Course/ Medical Decision Making/ A&P Clinical Course as of 03/22/22 1749  Wed Mar 22, 2022  1604 No response to initial Narcan.  Patient's bedside CBG was 16.  Amp of D50 given. [JK]  1635 Comprehensive metabolic panel(!) Blood sugar elevated to 69 [JK]  1635 CBC(!) Normal [JK]  1635 Temp(!): 92.1 F (33.4 C) Hypothermia noted [JK]  1717 Patient's mental status has improved.  Answering questions appropriately now [JK]  1744 Case discussed with Dr. Tamala Julian [JK]     Clinical Course User Index [JK] Dorie Rank, MD                           Medical Decision Making Differential diagnosis includes but not limited to stroke, cerebral hemorrhage hypoglycemia, uremia, toxic ingestion  Problems Addressed: Hypertension, unspecified type: acute illness or injury Hypoglycemia: acute illness or injury that poses a threat to life or bodily functions Hypothermia, initial encounter: acute illness or injury that poses a threat to life or bodily functions  Amount and/or Complexity of Data Reviewed Independent Historian: spouse External Data Reviewed: notes.    Details: Prior notes reviewed.  Patient recently had her insulin 75/25 increased to 15 units with breakfast and supper if her glucose is above 90 Labs: ordered. Decision-making details documented in ED Course.  Risk Prescription drug management. Decision regarding hospitalization.   Patient presented with altered mental status.  CBG ordered immediately when patient was brought to the bedside.  Blood sugar profoundly decreased at 16.  Patient's initial vital signs showed hypertension normal cardiac rhythm but was also noted to be hypothermic.  Patient was started on a warming blanket.  Her mental status has improved as she has been here.  She has been maintained on a dextrose infusion due to degree of her hypoglycemia.  Suspect her hypothermia is likely related to her profound hypoglycemia but I will add on a TSH.  No acute signs of infection at this time we will continue to monitor closely.  Unclear exactly what has precipitated this hypothermia but patient did have a recent increase in her home insulin regimen.  I will consult the medical service for admission and observation        Final Clinical Impression(s) / ED Diagnoses Final diagnoses:  Hypoglycemia  Hypothermia, initial encounter  Hypertension, unspecified type  Rx / DC Orders ED Discharge Orders     None         Dorie Rank,  MD 03/22/22 [phone]

## 2022-03-22 NOTE — ED Notes (Signed)
Pt is alert and oriented to self and place. Informed pt of why she was here (CBG 16, rectal temp 92.1 F on arrival). Pt comfortable at this time, family sitting with pt.

## 2022-03-23 DIAGNOSIS — E11649 Type 2 diabetes mellitus with hypoglycemia without coma: Secondary | ICD-10-CM

## 2022-03-23 DIAGNOSIS — I10 Essential (primary) hypertension: Secondary | ICD-10-CM

## 2022-03-23 DIAGNOSIS — Z794 Long term (current) use of insulin: Secondary | ICD-10-CM

## 2022-03-23 DIAGNOSIS — G9341 Metabolic encephalopathy: Secondary | ICD-10-CM

## 2022-03-23 DIAGNOSIS — R600 Localized edema: Secondary | ICD-10-CM | POA: Diagnosis not present

## 2022-03-23 LAB — GLUCOSE, CAPILLARY
Glucose-Capillary: 151 mg/dL — ABNORMAL HIGH (ref 70–99)
Glucose-Capillary: 312 mg/dL — ABNORMAL HIGH (ref 70–99)
Glucose-Capillary: 333 mg/dL — ABNORMAL HIGH (ref 70–99)
Glucose-Capillary: 334 mg/dL — ABNORMAL HIGH (ref 70–99)
Glucose-Capillary: 383 mg/dL — ABNORMAL HIGH (ref 70–99)
Glucose-Capillary: 397 mg/dL — ABNORMAL HIGH (ref 70–99)
Glucose-Capillary: 516 mg/dL (ref 70–99)
Glucose-Capillary: 78 mg/dL (ref 70–99)

## 2022-03-23 LAB — CBC
HCT: 31.9 % — ABNORMAL LOW (ref 36.0–46.0)
Hemoglobin: 10.3 g/dL — ABNORMAL LOW (ref 12.0–15.0)
MCH: 31.6 pg (ref 26.0–34.0)
MCHC: 32.3 g/dL (ref 30.0–36.0)
MCV: 97.9 fL (ref 80.0–100.0)
Platelets: 194 10*3/uL (ref 150–400)
RBC: 3.26 MIL/uL — ABNORMAL LOW (ref 3.87–5.11)
RDW: 13.2 % (ref 11.5–15.5)
WBC: 7.3 10*3/uL (ref 4.0–10.5)
nRBC: 0 % (ref 0.0–0.2)

## 2022-03-23 LAB — BASIC METABOLIC PANEL
Anion gap: 9 (ref 5–15)
BUN: 41 mg/dL — ABNORMAL HIGH (ref 8–23)
CO2: 20 mmol/L — ABNORMAL LOW (ref 22–32)
Calcium: 8.9 mg/dL (ref 8.9–10.3)
Chloride: 105 mmol/L (ref 98–111)
Creatinine, Ser: 1.88 mg/dL — ABNORMAL HIGH (ref 0.44–1.00)
GFR, Estimated: 26 mL/min — ABNORMAL LOW (ref >60)
Glucose, Bld: 405 mg/dL — ABNORMAL HIGH (ref 70–99)
Potassium: 4.9 mmol/L (ref 3.5–5.1)
Sodium: 134 mmol/L — ABNORMAL LOW (ref 135–145)

## 2022-03-23 LAB — GLUCOSE, RANDOM: Glucose, Bld: 535 mg/dL (ref 70–99)

## 2022-03-23 MED ORDER — INSULIN ASPART PROT & ASPART (70-30 MIX) 100 UNIT/ML ~~LOC~~ SUSP
10.0000 [IU] | Freq: Two times a day (BID) | SUBCUTANEOUS | Status: DC
  Administered 2022-03-23 – 2022-03-24 (×2): 10 [IU] via SUBCUTANEOUS
  Filled 2022-03-23: qty 10

## 2022-03-23 MED ORDER — INSULIN ASPART 100 UNIT/ML IJ SOLN: 0.0000 [IU] | Freq: Every day | INTRAMUSCULAR | Status: DC

## 2022-03-23 MED ORDER — INSULIN ASPART 100 UNIT/ML IJ SOLN
15.0000 [IU] | Freq: Once | INTRAMUSCULAR | Status: AC
Start: 2022-03-23 — End: 2022-03-23
  Administered 2022-03-23: 15 [IU] via SUBCUTANEOUS

## 2022-03-23 MED ORDER — INSULIN ASPART 100 UNIT/ML IJ SOLN: 0.0000 [IU] | Freq: Three times a day (TID) | INTRAMUSCULAR | Status: DC

## 2022-03-23 MED ORDER — INSULIN ASPART 100 UNIT/ML IJ SOLN
0.0000 [IU] | Freq: Three times a day (TID) | INTRAMUSCULAR | Status: DC
  Administered 2022-03-23: 7 [IU] via SUBCUTANEOUS
  Administered 2022-03-24: 9 [IU] via SUBCUTANEOUS

## 2022-03-23 MED ORDER — INSULIN ASPART PROT & ASPART (70-30 MIX) 100 UNIT/ML ~~LOC~~ SUSP
10.0000 [IU] | Freq: Two times a day (BID) | SUBCUTANEOUS | Status: DC
  Administered 2022-03-23: 10 [IU] via SUBCUTANEOUS
  Filled 2022-03-23: qty 10

## 2022-03-23 MED ORDER — INSULIN ASPART PROT & ASPART (70-30 MIX) 100 UNIT/ML ~~LOC~~ SUSP
10.0000 [IU] | Freq: Two times a day (BID) | SUBCUTANEOUS | Status: DC
  Filled 2022-03-23: qty 10

## 2022-03-23 MED ORDER — INSULIN ASPART 100 UNIT/ML IJ SOLN
4.0000 [IU] | Freq: Once | INTRAMUSCULAR | Status: AC
  Administered 2022-03-23: 4 [IU] via SUBCUTANEOUS

## 2022-03-23 NOTE — Care Management Obs Status (Signed)
[place] NOTIFICATION   Patient Details  Name: Emily Jennings MRN: [id number] Date of Birth: 23-Dec-1933   Medicare Observation Status Notification Given:       Tommy Medal 03/23/2022, 4:36 PM

## 2022-03-23 NOTE — Inpatient Diabetes Management (Addendum)
Inpatient Diabetes Program Recommendations  AACE/ADA: New Consensus Statement on Inpatient Glycemic Control (2015)  Target Ranges:  Prepandial:   less than 140 mg/dL      Peak postprandial:   less than 180 mg/dL (1-2 hours)      Critically ill patients:  140 - 180 mg/dL   Lab Results  Component Value Date   GLUCAP 516 (HH) 03/23/2022   HGBA1C 7.2 (H) 03/22/2022    Review of Glycemic Control  Diabetes history: DM type 2 Outpatient Diabetes medications: 75/25 15 units qam, 10 units qpm Current orders for Inpatient glycemic control:  70/30 10 units bid Novolog 0-9 units tid + hs  A1c 7.2% on 7/5  NOTE: hyperglycemia 400-500 range. First dose 70/30 given at lunchtime today. Watch trends for now.  Pt had recent visit with Dr. Dorris Fetch on 6/21 at that time A1c and glucose trends stable and it was decided that 75/25 would stay at current dose. However, on 6/26 pts glucose trends at home were>300. Dr. Liliane Channel NP increased dose. Pt did not have hypoglycemia issues until now.    Attempted to call pt room to discuss trends at home. No answer. Will attempt again at a later time.  Thanks,  Tama Headings RN, MSN, BC-ADM Inpatient Diabetes Coordinator Team Pager [phone] (8a-5p)

## 2022-03-23 NOTE — Progress Notes (Signed)
PROGRESS NOTE    Emily Jennings  XAJ:[id number] DOB: 01-24-1934 DOA: 03/22/2022 PCP: [name], MD    Brief Narrative:  88 year old female who has insulin-dependent diabetes, admitted to the hospital with severe symptomatic hypoglycemia and associated encephalopathy/hypothermia.  Recently had change in her insulin dosing and reportedly accidentally took a larger amount of insulin than was prescribed.  Overall blood sugars have improved since admission   Assessment & Plan:   Principal Problem:   Uncontrolled type 2 diabetes mellitus with hypoglycemia, with long-term current use of insulin (HCC) Active Problems:   Acute metabolic encephalopathy   Hypothermia   Essential hypertension   Stage 3b chronic kidney disease (HCC)   Mixed hyperlipidemia   Heart murmur   Bilateral lower extremity edema   Uncontrolled type 2 diabetes mellitus, with hypoglycemia and hyperglycemia -Admitted with severe symptomatic hypoglycemia -Reports that her insulin dosing of Humalog 75/25 was recently increased from 10 units twice daily to 15 units twice daily.  She is supposed to take 15 units if her blood sugar is greater than 200, 10 units if blood sugar is less than 200.  Reports she has been doing this for the past few weeks and blood sugars have been fairly well controlled in the low 100 range. -Yesterday, even though her blood sugar was in the low 100s, she took 15 units which she feels precipitated her hypoglycemic episodes -She does not normally have episodes of hypoglycemia -She was briefly placed on dextrose infusion on admission which has since been discontinued -Insulin not restarted until today and she has developed significant hyperglycemia -We will monitor blood sugars today to ensure stability -She will need follow-up with endocrinology  Acute metabolic encephalopathy -Related to hypoglycemia -Resolved  Hypothermia -Related to hypoglycemia -No signs of infection -Resolved  CKD  stage IIIb/IV -Creatinine currently at baseline   DVT prophylaxis: enoxaparin (LOVENOX) injection 30 mg Start: 03/22/22 2000  Code Status: Full code Family Communication: Discussed with son at the bedside Disposition Plan: Status is: Observation The patient remains OBS appropriate and will d/c before 2 midnights.     Consultants:    Procedures:    Antimicrobials:      Subjective: Patient is sitting up in chair, awake, alert, she is feeling better.  She did have some nausea and vomiting earlier today.  Currently eating lunch.  Objective: Vitals:   03/23/22 0013 03/23/22 0431 03/23/22 0800 03/23/22 1340  BP: (!) 187/71 (!) 146/76 (!) 144/74 (!) 127/58  Pulse: 96 91 92 73  Resp: '17 16 16 18  '$ Temp: 99 F (37.2 C) 99.3 F (37.4 C) 99 F (37.2 C) 98.1 F (36.7 C)  TempSrc: Oral Oral Oral Oral  SpO2: 100% 99% 99% 99%  Weight:      Height:        Intake/Output Summary (Last 24 hours) at 03/23/2022 1930 Last data filed at 03/23/2022 1700 Gross per 24 hour  Intake 240 ml  Output --  Net 240 ml   Filed Weights   03/22/22 2017  Weight: 53.3 kg    Examination:  General exam: Appears calm and comfortable  Respiratory system: Clear to auscultation. Respiratory effort normal. Cardiovascular system: S1 & S2 heard, RRR. No JVD, murmurs, rubs, gallops or clicks. No pedal edema. Gastrointestinal system: Abdomen is nondistended, soft and nontender. No organomegaly or masses felt. Normal bowel sounds heard. Central nervous system: Alert and oriented. No focal neurological deficits. Extremities: Symmetric 5 x 5 power. Skin: No rashes, lesions or ulcers Psychiatry: Judgement  and insight appear normal. Mood & affect appropriate.     Data Reviewed: I have personally reviewed following labs and imaging studies  CBC: Recent Labs  Lab 03/22/22 1600 03/23/22 0131  WBC 10.3 7.3  HGB 12.1 10.3*  HCT 36.4 31.9*  MCV 96.3 97.9  PLT 230 268   Basic Metabolic Panel: Recent  Labs  Lab 03/22/22 1600 03/23/22 0131 03/23/22 1200  NA 133* 134*  --   K 4.4 4.9  --   CL 102 105  --   CO2 24 20*  --   GLUCOSE 269* 405* 535*  BUN 38* 41*  --   CREATININE 1.72* 1.88*  --   CALCIUM 9.3 8.9  --    GFR: Estimated Creatinine Clearance: 16.2 mL/min (A) (by C-G formula based on SCr of 1.88 mg/dL (H)). Liver Function Tests: Recent Labs  Lab 03/22/22 1600  AST 30  ALT 20  ALKPHOS 62  BILITOT 0.6  PROT 6.8  ALBUMIN 3.9   No results for input(s): "LIPASE", "AMYLASE" in the last 168 hours. No results for input(s): "AMMONIA" in the last 168 hours. Coagulation Profile: No results for input(s): "INR", "PROTIME" in the last 168 hours. Cardiac Enzymes: No results for input(s): "CKTOTAL", "CKMB", "CKMBINDEX", "TROPONINI" in the last 168 hours. BNP (last 3 results) No results for input(s): "PROBNP" in the last 8760 hours. HbA1C: Recent Labs    03/22/22 1843  HGBA1C 7.2*   CBG: Recent Labs  Lab 03/23/22 0106 03/23/22 0432 03/23/22 0735 03/23/22 1122 03/23/22 1611  GLUCAP 397* 334* 312* 516* 333*   Lipid Profile: No results for input(s): "CHOL", "HDL", "LDLCALC", "TRIG", "CHOLHDL", "LDLDIRECT" in the last 72 hours. Thyroid Function Tests: Recent Labs    03/22/22 1757  TSH 2.057   Anemia Panel: No results for input(s): "VITAMINB12", "FOLATE", "FERRITIN", "TIBC", "IRON", "RETICCTPCT" in the last 72 hours. Sepsis Labs: No results for input(s): "PROCALCITON", "LATICACIDVEN" in the last 168 hours.  No results found for this or any previous visit (from the past 240 hour(s)).       Radiology Studies: DG CHEST PORT 1 VIEW  Result Date: 03/22/2022 CLINICAL DATA:  [id number].  Short of breath.  Hypoglycemia. EXAM: PORTABLE CHEST 1 VIEW COMPARISON:  Chest x-ray 12/27/2020, CT chest 01/02/2022 FINDINGS: The heart and mediastinal contours are unchanged. Aortic calcification. Stable left lower lobe calcified nodule. No focal consolidation. Chronic increased  markings with no overt pulmonary edema. No pleural effusion. No pneumothorax. No acute osseous abnormality. IMPRESSION: 1. No active disease. 2.  Aortic Atherosclerosis (ICD10-I70.0). Electronically Signed   By: Iven Finn M.D.   On: 03/22/2022 22:06        Scheduled Meds:  amLODipine  10 mg Oral Daily   And   irbesartan  300 mg Oral Daily   aspirin EC  81 mg Oral Daily   carvedilol  12.5 mg Oral BID WC   cycloSPORINE  1 drop Both Eyes QPM   dicyclomine  10 mg Oral TID AC   enoxaparin (LOVENOX) injection  30 mg Subcutaneous Q24H   insulin aspart  0-5 Units Subcutaneous QHS   insulin aspart  0-9 Units Subcutaneous TID WC   insulin aspart protamine- aspart  10 Units Subcutaneous BID WC   rosuvastatin  5 mg Oral Daily   sodium chloride flush  3 mL Intravenous Q12H   sodium chloride flush  3 mL Intravenous Q12H   spironolactone  50 mg Oral Daily   Continuous Infusions:  sodium chloride  LOS: 0 days    Time spent: 18mns    JKathie Dike MD [facility]   If 7PM-7AM, please contact night-coverage [email or web address]  03/23/2022, 7:30 PM

## 2022-03-23 NOTE — TOC Progression Note (Signed)
  Transition of Care [facility]) Screening Note   Patient Details  Name: Emily Jennings Date of Birth: November 26, 1933   Transition of Care [facility]) CM/SW Contact:    Boneta Lucks, RN Phone Number: 03/23/2022, 12:54 PM    Transition of Care Department [facility]) has reviewed patient and no TOC needs have been identified at this time. We will continue to monitor patient advancement through interdisciplinary progression rounds. If new patient transition needs arise, please place a TOC consult.    Expected Discharge Plan: Home/Self Care Barriers to Discharge: Continued Medical Work up  Expected Discharge Plan and Services Expected Discharge Plan: Home/Self Care

## 2022-03-23 NOTE — Care Management Obs Status (Signed)
[facility] [place] [facility] NOTIFICATION   Patient Details  Name: Emily Jennings MRN: [id number] Date of Birth: Jan 24, 1934   Medicare Observation Status Notification Given:  Yes    Tommy Medal 03/23/2022, 4:49 PM

## 2022-03-24 DIAGNOSIS — G9341 Metabolic encephalopathy: Secondary | ICD-10-CM | POA: Diagnosis not present

## 2022-03-24 DIAGNOSIS — R600 Localized edema: Secondary | ICD-10-CM | POA: Diagnosis not present

## 2022-03-24 DIAGNOSIS — E11649 Type 2 diabetes mellitus with hypoglycemia without coma: Secondary | ICD-10-CM | POA: Diagnosis not present

## 2022-03-24 DIAGNOSIS — I10 Essential (primary) hypertension: Secondary | ICD-10-CM | POA: Diagnosis not present

## 2022-03-24 LAB — GLUCOSE, CAPILLARY
Glucose-Capillary: 68 mg/dL — ABNORMAL LOW (ref 70–99)
Glucose-Capillary: 261 mg/dL — ABNORMAL HIGH (ref 70–99)
Glucose-Capillary: 336 mg/dL — ABNORMAL HIGH (ref 70–99)
Glucose-Capillary: 415 mg/dL — ABNORMAL HIGH (ref 70–99)
Glucose-Capillary: 352 mg/dL — ABNORMAL HIGH (ref 70–99)
Glucose-Capillary: 278 mg/dL — ABNORMAL HIGH (ref 70–99)
Glucose-Capillary: 304 mg/dL — ABNORMAL HIGH (ref 70–99)

## 2022-03-24 MED ORDER — INSULIN ASPART PROT & ASPART (70-30 MIX) 100 UNIT/ML ~~LOC~~ SUSP
5.0000 [IU] | SUBCUTANEOUS | Status: AC
Start: 2022-03-24 — End: 2022-03-24
  Administered 2022-03-24: 5 [IU] via SUBCUTANEOUS
  Filled 2022-03-24: qty 10

## 2022-03-24 MED ORDER — INSULIN ASPART PROT & ASPART (70-30 MIX) 100 UNIT/ML ~~LOC~~ SUSP
15.0000 [IU] | Freq: Two times a day (BID) | SUBCUTANEOUS | Status: DC
  Filled 2022-03-24: qty 10

## 2022-03-24 NOTE — Inpatient Diabetes Management (Signed)
Inpatient Diabetes Program Recommendations  AACE/ADA: New Consensus Statement on Inpatient Glycemic Control (2015)  Target Ranges:  Prepandial:   less than 140 mg/dL      Peak postprandial:   less than 180 mg/dL (1-2 hours)      Critically ill patients:  140 - 180 mg/dL   Lab Results  Component Value Date   GLUCAP 278 (H) 03/24/2022   HGBA1C 7.2 (H) 03/22/2022    Review of Glycemic Control  Diabetes history: DM type 2 Outpatient Diabetes medications: 75/25 15 units qam, 10 units qpm Current orders for Inpatient glycemic control:  70/30 15 units bid Novolog 0-9 units tid + hs  A1c 7.2% on 7/5  NOTE: hyperglycemia 400-500 range. First dose 70/30 given at lunchtime today. Watch trends for now.  Pt had recent visit with Dr. Dorris Fetch on 6/21 at that time A1c and glucose trends stable and it was decided that 75/25 would stay at current dose. However, on 6/26 pts glucose trends at home were>300. Dr. Liliane Channel NP increased dose. Pt did not have hypoglycemia issues until now.    Spoke with pt and pts husband briefly over the phone regarding Hypoglycemia at home. Glucose trends did not meet criteria for her to give the insulin and resulted in hypoglycemia. They have close follow up with Dr. Dorris Fetch and have close communication with him for dose adjustments by calling the office.  Thanks,  Tama Headings RN, MSN, BC-ADM Inpatient Diabetes Coordinator Team Pager [phone] (8a-5p)

## 2022-03-24 NOTE — Progress Notes (Addendum)
Discharge instructions reviewed and follow-up appts made. No new medications. Contacted Lynn, dietician ,and she will come to patient's room and talk to her before leaving.

## 2022-03-24 NOTE — Progress Notes (Signed)
Glocose 304 after eating a small amount of lunch and declining sliding scale insulin before lunch. Iv's removed and family going to get clothes

## 2022-03-24 NOTE — Progress Notes (Signed)
  RD consulted for nutrition education regarding diabetes.   Lab Results  Component Value Date   HGBA1C 7.2 (H) 03/22/2022    RD discussed different food groups and their effects on blood sugar, emphasizing carbohydrate-containing foods. Patient eats 3 meals daily and snack. Oatmeal and fruit breakfast, veggies at lunch and she cooks dinner for her and husband. Most days she eat some type of salad. She likes nuts sometimes for snack.   Discussed importance of controlled and consistent carbohydrate intake throughout the day. Provided examples of ways to balance meals/snacks and encouraged intake of high-fiber, whole grain complex carbohydrates.   Expect good compliance. Patient has demonstrated a good understanding of appropriate food choices for diabetes management. She is also conscious of portion sizes and limits excess protein due to her DM and CKD.  Body mass index is 22.2 kg/m. Pt meets criteria for normal based on current BMI.  Current diet order is CHO Modified, patient is consuming approximately 75% of meals at this time.   Labs and medications reviewed.   No further nutrition interventions warranted at this time. Patient is followed by Dr Dorris Fetch in the community.   Colman Cater MS,RD,CSG,LDN Contact: Shea Evans

## 2022-03-24 NOTE — Progress Notes (Signed)
Glucose 352 and refused novolog due to feeling like glucose was dropping. Checked about an hour later and was 278 when just beginning to eat lunch.  Dr. Roderic Palau aware and will recheck around 1400.  Husband at bedside and patient sitting up in chair eating.

## 2022-03-24 NOTE — Discharge Summary (Signed)
Physician Discharge Summary  Emily Jennings JKD:[id number] DOB: 1933-10-19 DOA: 03/22/2022  PCP: [name], MD  Admit date: 03/22/2022 Discharge date: 03/24/2022  Admitted From: Home Disposition: Home  Recommendations for Outpatient Follow-up:  Follow up with PCP in 1-2 weeks Please obtain BMP/CBC in one week Follow-up with endocrinology   Discharge Condition: Stable CODE STATUS: Full code Diet recommendation: Carb modified  Brief/Interim Summary: 88 year old female who has insulin-dependent diabetes, admitted to the hospital with severe symptomatic hypoglycemia and associated encephalopathy/hypothermia.  Recently had change in her insulin dosing and reportedly accidentally took a larger amount of insulin than was prescribed.  Overall blood sugars have improved since admission  Discharge Diagnoses:  Principal Problem:   Uncontrolled type 2 diabetes mellitus with hypoglycemia, with long-term current use of insulin (HCC) Active Problems:   Acute metabolic encephalopathy   Hypothermia   Essential hypertension   Stage 3b chronic kidney disease ([place])   Mixed hyperlipidemia   Heart murmur   Bilateral lower extremity edema  Uncontrolled type 2 diabetes mellitus, with hypoglycemia and hyperglycemia -Admitted with severe symptomatic hypoglycemia -Reports that her insulin dosing of Humalog 75/25 was recently increased from 10 units twice daily to 15 units twice daily.  She is supposed to take 15 units if her blood sugar is greater than 200, 10 units if blood sugar is less than 200.  Reports she has been doing this for the past few weeks and blood sugars have been fairly well controlled in the low 100 range. -On the morning of admission, even though her blood sugar was in the low 100s, she accidentally took 15 units which she feels precipitated her hypoglycemic episodes -She does not normally have episodes of hypoglycemia on current home regimen -She was briefly placed on dextrose  infusion on admission which has since been discontinued -Since restarting subcutaneous insulin, she has had some episodes of hypoglycemia/hypoglycemia, but overall blood sugars appear to be stabilizing -She will need follow-up with endocrinology   Acute metabolic encephalopathy -Related to hypoglycemia -Resolved   Hypothermia -Related to hypoglycemia -No signs of infection -Resolved   CKD stage IIIb/IV -Creatinine currently at baseline  Discharge Instructions  Discharge Instructions     Diet - low sodium heart healthy   Complete by: As directed    Increase activity slowly   Complete by: As directed       Allergies as of 03/24/2022       Reactions   Benadryl [diphenhydramine Hcl] Hypertension   Citalopram    Metformin And Related Diarrhea   Lost appetite and weight    Tramadol Other (See Comments)   Felt light headed and dizzy   Crestor [rosuvastatin] Other (See Comments)   "Feet swelling", makes her feel weak        Medication List     TAKE these medications    acetaminophen 500 MG tablet Commonly known as: TYLENOL Take one tablet two times daily for 3 days, then as needed   alendronate 70 MG tablet Commonly known as: FOSAMAX TAKE 1 TABLET BY MOUTH EVERY 7 DAYS. TAKE WITH A FULL GLASS OF WATER ON AN EMPTY STOMACH What changed: See the new instructions.   amLODipine-olmesartan 10-40 MG tablet Commonly known as: AZOR TAKE 1 TABLET BY MOUTH EVERY DAY   aspirin EC 81 MG tablet Take 81 mg by mouth daily.   blood glucose meter kit and supplies Kit One touch Ultra. Use three times daily as directed. (FOR ICD E11.65)   carvedilol 12.5 MG tablet Commonly  known as: COREG TAKE 1 TABLET (12.5MG TOTAL) BY MOUTH TWICE A DAY WITH MEALS What changed: See the new instructions.   Centrum Silver 50+Women Tabs Take 1 tablet by mouth daily.   cholecalciferol 25 MCG (1000 UNIT) tablet Commonly known as: VITAMIN D3 Take 2,000 Units by mouth daily.   dicyclomine 10  MG capsule Commonly known as: BENTYL Take 1 capsule (10 mg total) by mouth 3 (three) times daily before meals.   HumaLOG Mix 75/25 (75-25) 100 UNIT/ML Susp injection Generic drug: insulin lispro protamine-lispro INJECT SUBCUTANEOUSLY 10 UNITS TWICE DAILY What changed: See the new instructions.   Insulin Pen Needle 32G X 4 MM Misc 1 each by Does not apply route as directed.   Insulin Syringe-Needle U-100 31G X 5/16" 0.5 ML Misc Commonly known as: BD Insulin Syringe U/F USE AS DIRECTED TWICE DAILY   mupirocin ointment 2 % Commonly known as: BACTROBAN Apply topically 2 (two) times daily.   OneTouch Delica Lancets 23X Misc 1 each by Does not apply route 3 (three) times daily. Use to check glucose three times daily   OneTouch Ultra test strip Generic drug: glucose blood USE TO TEST 2 TIMES A DAY   potassium chloride SA 20 MEQ tablet Commonly known as: Klor-Con M20 Take 1 tablet (20 mEq total) by mouth 2 (two) times daily.   Restasis 0.05 % ophthalmic emulsion Generic drug: cycloSPORINE Place 1 drop into both eyes every evening.   rosuvastatin 5 MG tablet Commonly known as: CRESTOR TAKE 1 TABLET (5 MG TOTAL) BY MOUTH DAILY.   spironolactone 50 MG tablet Commonly known as: ALDACTONE TAKE 1 TABLET BY MOUTH EVERY DAY        Allergies  Allergen Reactions   Benadryl [Diphenhydramine Hcl] Hypertension   Citalopram    Metformin And Related Diarrhea    Lost appetite and weight    Tramadol Other (See Comments)    Felt light headed and dizzy   Crestor [Rosuvastatin] Other (See Comments)    "Feet swelling", makes her feel weak    Consultations:    Procedures/Studies: DG CHEST PORT 1 VIEW  Result Date: 03/22/2022 CLINICAL DATA:  [id number].  Short of breath.  Hypoglycemia. EXAM: PORTABLE CHEST 1 VIEW COMPARISON:  Chest x-ray 12/27/2020, CT chest 01/02/2022 FINDINGS: The heart and mediastinal contours are unchanged. Aortic calcification. Stable left lower lobe calcified  nodule. No focal consolidation. Chronic increased markings with no overt pulmonary edema. No pleural effusion. No pneumothorax. No acute osseous abnormality. IMPRESSION: 1. No active disease. 2.  Aortic Atherosclerosis (ICD10-I70.0). Electronically Signed   By: Iven Finn M.D.   On: 03/22/2022 22:06      Subjective: She is feeling better.  No new complaints  Discharge Exam: Vitals:   03/23/22 0800 03/23/22 1340 03/23/22 2128 03/24/22 0404  BP: (!) 144/74 (!) 127/58 (!) 154/66 136/89  Pulse: 92 73 77 81  Resp: _0 Temp: 99 F (37.2 C) 98.1 F (36.7 C) 98.1 F (36.7 C) 98.7 F (37.1 C)  TempSrc: Oral Oral Oral Oral  SpO2: 99% 99% 100% 97%  Weight:      Height:        General: Pt is alert, awake, not in acute distress Cardiovascular: RRR, S1/S2 +, no rubs, no gallops Respiratory: CTA bilaterally, no wheezing, no rhonchi Abdominal: Soft, NT, ND, bowel sounds + Extremities: no edema, no cyanosis    The results of significant diagnostics from this hospitalization (including imaging, microbiology, ancillary and laboratory) are listed below for  reference.     Microbiology: No results found for this or any previous visit (from the past 240 hour(s)).   Labs: BNP (last 3 results) Recent Labs    03/22/22 1600  BNP 595.3*   Basic Metabolic Panel: Recent Labs  Lab 03/22/22 1600 03/23/22 0131 03/23/22 1200  NA 133* 134*  --   K 4.4 4.9  --   CL 102 105  --   CO2 24 20*  --   GLUCOSE 269* 405* 535*  BUN 38* 41*  --   CREATININE 1.72* 1.88*  --   CALCIUM 9.3 8.9  --    Liver Function Tests: Recent Labs  Lab 03/22/22 1600  AST 30  ALT 20  ALKPHOS 62  BILITOT 0.6  PROT 6.8  ALBUMIN 3.9   No results for input(s): "LIPASE", "AMYLASE" in the last 168 hours. No results for input(s): "AMMONIA" in the last 168 hours. CBC: Recent Labs  Lab 03/22/22 1600 03/23/22 0131  WBC 10.3 7.3  HGB 12.1 10.3*  HCT 36.4 31.9*  MCV 96.3 97.9  PLT 230 194    Cardiac Enzymes: No results for input(s): "CKTOTAL", "CKMB", "CKMBINDEX", "TROPONINI" in the last 168 hours. BNP: Invalid input(s): "POCBNP" CBG: Recent Labs  Lab 03/24/22 0717 03/24/22 0937 03/24/22 1121 03/24/22 1239 03/24/22 1401  GLUCAP 336* 415* 352* 278* 304*   D-Dimer No results for input(s): "DDIMER" in the last 72 hours. Hgb A1c Recent Labs    03/22/22 1843  HGBA1C 7.2*   Lipid Profile No results for input(s): "CHOL", "HDL", "LDLCALC", "TRIG", "CHOLHDL", "LDLDIRECT" in the last 72 hours. Thyroid function studies Recent Labs    03/22/22 1757  TSH 2.057   Anemia work up No results for input(s): "VITAMINB12", "FOLATE", "FERRITIN", "TIBC", "IRON", "RETICCTPCT" in the last 72 hours. Urinalysis    Component Value Date/Time   COLORURINE YELLOW 03/22/2022 1956   APPEARANCEUR CLEAR 03/22/2022 1956   LABSPEC 1.010 03/22/2022 1956   PHURINE 8.0 03/22/2022 1956   GLUCOSEU 150 (A) 03/22/2022 1956   HGBUR NEGATIVE 03/22/2022 1956   HGBUR trace-intact 04/28/2010 [place] 03/22/2022 1956   BILIRUBINUR neg 09/29/2013 1341   KETONESUR NEGATIVE 03/22/2022 1956   PROTEINUR >=300 (A) 03/22/2022 1956   UROBILINOGEN 0.2 09/29/2013 1341   UROBILINOGEN 0.2 12/28/2010 1743   NITRITE NEGATIVE 03/22/2022 1956   LEUKOCYTESUR NEGATIVE 03/22/2022 1956   Sepsis Labs Recent Labs  Lab 03/22/22 1600 03/23/22 0131  WBC 10.3 7.3   Microbiology No results found for this or any previous visit (from the past 240 hour(s)).   Time coordinating discharge: 81mns  SIGNED:   JKathie Dike MD  [facility] 03/24/2022, 6:52 PM   If 7PM-7AM, please contact night-coverage [email or web address]

## 2022-03-25 ENCOUNTER — Encounter (HOSPITAL_COMMUNITY): Payer: Self-pay | Admitting: *Deleted

## 2022-03-25 ENCOUNTER — Other Ambulatory Visit: Payer: Self-pay

## 2022-03-25 ENCOUNTER — Inpatient Hospital Stay (HOSPITAL_COMMUNITY)
Admission: EM | Admit: 2022-03-25 | Discharge: 2022-03-27 | DRG: 637 | Disposition: A | Discharge status: Home / Self Care | Payer: Medicare Other | Attending: Internal Medicine | Admitting: Internal Medicine

## 2022-03-25 DIAGNOSIS — Z7982 Long term (current) use of aspirin: Secondary | ICD-10-CM | POA: Diagnosis not present

## 2022-03-25 DIAGNOSIS — E782 Mixed hyperlipidemia: Secondary | ICD-10-CM | POA: Diagnosis not present

## 2022-03-25 DIAGNOSIS — Z79899 Other long term (current) drug therapy: Secondary | ICD-10-CM

## 2022-03-25 DIAGNOSIS — I10 Essential (primary) hypertension: Secondary | ICD-10-CM | POA: Diagnosis present

## 2022-03-25 DIAGNOSIS — Z823 Family history of stroke: Secondary | ICD-10-CM

## 2022-03-25 DIAGNOSIS — E11649 Type 2 diabetes mellitus with hypoglycemia without coma: Principal | ICD-10-CM | POA: Diagnosis present

## 2022-03-25 DIAGNOSIS — E1165 Type 2 diabetes mellitus with hyperglycemia: Secondary | ICD-10-CM | POA: Diagnosis present

## 2022-03-25 DIAGNOSIS — E785 Hyperlipidemia, unspecified: Secondary | ICD-10-CM | POA: Diagnosis present

## 2022-03-25 DIAGNOSIS — E1122 Type 2 diabetes mellitus with diabetic chronic kidney disease: Secondary | ICD-10-CM | POA: Diagnosis present

## 2022-03-25 DIAGNOSIS — Z800 Family history of malignant neoplasm of digestive organs: Secondary | ICD-10-CM

## 2022-03-25 DIAGNOSIS — R011 Cardiac murmur, unspecified: Secondary | ICD-10-CM | POA: Diagnosis present

## 2022-03-25 DIAGNOSIS — E1010 Type 1 diabetes mellitus with ketoacidosis without coma: Secondary | ICD-10-CM | POA: Diagnosis not present

## 2022-03-25 DIAGNOSIS — Z888 Allergy status to other drugs, medicaments and biological substances status: Secondary | ICD-10-CM | POA: Diagnosis not present

## 2022-03-25 DIAGNOSIS — Z794 Long term (current) use of insulin: Secondary | ICD-10-CM

## 2022-03-25 DIAGNOSIS — Z8249 Family history of ischemic heart disease and other diseases of the circulatory system: Secondary | ICD-10-CM

## 2022-03-25 DIAGNOSIS — M810 Age-related osteoporosis without current pathological fracture: Secondary | ICD-10-CM | POA: Diagnosis present

## 2022-03-25 DIAGNOSIS — I1A0 Resistant hypertension: Secondary | ICD-10-CM | POA: Diagnosis present

## 2022-03-25 DIAGNOSIS — N1832 Chronic kidney disease, stage 3b: Secondary | ICD-10-CM | POA: Diagnosis not present

## 2022-03-25 DIAGNOSIS — Z91128 Patient's intentional underdosing of medication regimen for other reason: Secondary | ICD-10-CM

## 2022-03-25 DIAGNOSIS — Z853 Personal history of malignant neoplasm of breast: Secondary | ICD-10-CM | POA: Diagnosis not present

## 2022-03-25 DIAGNOSIS — I129 Hypertensive chronic kidney disease with stage 1 through stage 4 chronic kidney disease, or unspecified chronic kidney disease: Secondary | ICD-10-CM | POA: Diagnosis present

## 2022-03-25 DIAGNOSIS — T383X6A Underdosing of insulin and oral hypoglycemic [antidiabetic] drugs, initial encounter: Secondary | ICD-10-CM | POA: Diagnosis not present

## 2022-03-25 DIAGNOSIS — E1110 Type 2 diabetes mellitus with ketoacidosis without coma: Secondary | ICD-10-CM | POA: Diagnosis present

## 2022-03-25 DIAGNOSIS — G9341 Metabolic encephalopathy: Secondary | ICD-10-CM | POA: Diagnosis present

## 2022-03-25 DIAGNOSIS — R680 Hypothermia, not associated with low environmental temperature: Secondary | ICD-10-CM | POA: Diagnosis present

## 2022-03-25 DIAGNOSIS — E871 Hypo-osmolality and hyponatremia: Secondary | ICD-10-CM

## 2022-03-25 DIAGNOSIS — Z7983 Long term (current) use of bisphosphonates: Secondary | ICD-10-CM | POA: Diagnosis not present

## 2022-03-25 DIAGNOSIS — Z83438 Family history of other disorder of lipoprotein metabolism and other lipidemia: Secondary | ICD-10-CM | POA: Diagnosis not present

## 2022-03-25 LAB — URINALYSIS, ROUTINE W REFLEX MICROSCOPIC
Bacteria, UA: NONE SEEN
Bilirubin Urine: NEGATIVE
Glucose, UA: >=500 mg/dL — AB
Ketones, ur: 20 mg/dL — AB
Leukocytes,Ua: NEGATIVE
Nitrite: NEGATIVE
Protein, ur: 100 mg/dL — AB
Specific Gravity, Urine: 1.014 (ref 1.005–1.030)
pH: 5 (ref 5.0–8.0)

## 2022-03-25 LAB — CBC
HCT: 34.6 % — ABNORMAL LOW (ref 36.0–46.0)
Hemoglobin: 11.5 g/dL — ABNORMAL LOW (ref 12.0–15.0)
MCH: 31.5 pg (ref 26.0–34.0)
MCHC: 33.2 g/dL (ref 30.0–36.0)
MCV: 94.8 fL (ref 80.0–100.0)
Platelets: 201 10*3/uL (ref 150–400)
RBC: 3.65 MIL/uL — ABNORMAL LOW (ref 3.87–5.11)
RDW: 12.5 % (ref 11.5–15.5)
WBC: 6.3 10*3/uL (ref 4.0–10.5)
nRBC: 0 % (ref 0.0–0.2)

## 2022-03-25 LAB — BASIC METABOLIC PANEL
Anion gap: 15 (ref 5–15)
BUN: 47 mg/dL — ABNORMAL HIGH (ref 8–23)
CO2: 19 mmol/L — ABNORMAL LOW (ref 22–32)
Calcium: 9.1 mg/dL (ref 8.9–10.3)
Chloride: 93 mmol/L — ABNORMAL LOW (ref 98–111)
Creatinine, Ser: 1.96 mg/dL — ABNORMAL HIGH (ref 0.44–1.00)
GFR, Estimated: 24 mL/min — ABNORMAL LOW (ref >60)
Glucose, Bld: 561 mg/dL (ref 70–99)
Potassium: 5.3 mmol/L — ABNORMAL HIGH (ref 3.5–5.1)
Sodium: 127 mmol/L — ABNORMAL LOW (ref 135–145)

## 2022-03-25 LAB — CBG MONITORING, ED
Glucose-Capillary: 430 mg/dL — ABNORMAL HIGH (ref 70–99)
Glucose-Capillary: 453 mg/dL — ABNORMAL HIGH (ref 70–99)
Glucose-Capillary: 522 mg/dL (ref 70–99)
Glucose-Capillary: 544 mg/dL (ref 70–99)

## 2022-03-25 MED ORDER — HEPARIN SODIUM (PORCINE) 5000 UNIT/ML IJ SOLN
5000.0000 [IU] | Freq: Three times a day (TID) | INTRAMUSCULAR | Status: DC
Start: 2022-03-25 — End: 2022-03-27
  Administered 2022-03-26 – 2022-03-27 (×5): 5000 [IU] via SUBCUTANEOUS
  Filled 2022-03-25 (×5): qty 1

## 2022-03-25 MED ORDER — OXYCODONE HCL 5 MG PO TABS: 5.0000 mg | ORAL_TABLET | ORAL | Status: DC | PRN

## 2022-03-25 MED ORDER — DEXTROSE IN LACTATED RINGERS 5 % IV SOLN: INTRAVENOUS | Status: DC

## 2022-03-25 MED ORDER — IRBESARTAN 150 MG PO TABS
300.0000 mg | ORAL_TABLET | Freq: Every day | ORAL | Status: DC
  Administered 2022-03-26 – 2022-03-27 (×2): 300 mg via ORAL
  Filled 2022-03-25 (×2): qty 2

## 2022-03-25 MED ORDER — CARVEDILOL 12.5 MG PO TABS
12.5000 mg | ORAL_TABLET | Freq: Two times a day (BID) | ORAL | Status: DC
  Administered 2022-03-26 – 2022-03-27 (×3): 12.5 mg via ORAL
  Filled 2022-03-25 (×3): qty 1

## 2022-03-25 MED ORDER — ACETAMINOPHEN 650 MG RE SUPP: 650.0000 mg | Freq: Four times a day (QID) | RECTAL | Status: DC | PRN

## 2022-03-25 MED ORDER — ONDANSETRON HCL 4 MG/2ML IJ SOLN: 4.0000 mg | Freq: Four times a day (QID) | INTRAMUSCULAR | Status: DC | PRN

## 2022-03-25 MED ORDER — ROSUVASTATIN CALCIUM 5 MG PO TABS
5.0000 mg | ORAL_TABLET | Freq: Every day | ORAL | Status: DC
  Administered 2022-03-26 – 2022-03-27 (×2): 5 mg via ORAL
  Filled 2022-03-25 (×2): qty 1

## 2022-03-25 MED ORDER — AMLODIPINE-OLMESARTAN 10-40 MG PO TABS: 1.0000 | ORAL_TABLET | Freq: Every day | ORAL | Status: DC

## 2022-03-25 MED ORDER — AMLODIPINE BESYLATE 5 MG PO TABS
10.0000 mg | ORAL_TABLET | Freq: Every day | ORAL | Status: DC
  Administered 2022-03-26 – 2022-03-27 (×2): 10 mg via ORAL
  Filled 2022-03-25 (×2): qty 2

## 2022-03-25 MED ORDER — ASPIRIN 81 MG PO TBEC
81.0000 mg | DELAYED_RELEASE_TABLET | Freq: Every day | ORAL | Status: DC
  Administered 2022-03-26 – 2022-03-27 (×2): 81 mg via ORAL
  Filled 2022-03-25 (×2): qty 1

## 2022-03-25 MED ORDER — SPIRONOLACTONE 25 MG PO TABS
50.0000 mg | ORAL_TABLET | Freq: Every day | ORAL | Status: DC
  Administered 2022-03-26 – 2022-03-27 (×2): 50 mg via ORAL
  Filled 2022-03-25 (×2): qty 2

## 2022-03-25 MED ORDER — ACETAMINOPHEN 325 MG PO TABS: 650.0000 mg | ORAL_TABLET | Freq: Four times a day (QID) | ORAL | Status: DC | PRN

## 2022-03-25 MED ORDER — INSULIN REGULAR(HUMAN) IN NACL 100-0.9 UT/100ML-% IV SOLN
INTRAVENOUS | Status: DC
  Administered 2022-03-25: 7 [IU]/h via INTRAVENOUS
  Filled 2022-03-25: qty 100

## 2022-03-25 MED ORDER — DEXTROSE 50 % IV SOLN: 0.0000 mL | INTRAVENOUS | Status: DC | PRN

## 2022-03-25 MED ORDER — ONDANSETRON HCL 4 MG PO TABS: 4.0000 mg | ORAL_TABLET | Freq: Four times a day (QID) | ORAL | Status: DC | PRN

## 2022-03-25 MED ORDER — MORPHINE SULFATE (PF) 2 MG/ML IV SOLN: 2.0000 mg | INTRAVENOUS | Status: DC | PRN

## 2022-03-25 MED ORDER — LACTATED RINGERS IV BOLUS
20.0000 mL/kg | Freq: Once | INTRAVENOUS | Status: AC
  Administered 2022-03-25: 1066 mL via INTRAVENOUS

## 2022-03-25 MED ORDER — LACTATED RINGERS IV SOLN
INTRAVENOUS | Status: DC
Start: 2022-03-25 — End: 2022-03-26

## 2022-03-25 NOTE — ED Notes (Signed)
Date and time results received: 03/25/22 2232   Test: Glucose Critical Value: 561  Name of Provider Notified: Vanita Panda, MD

## 2022-03-25 NOTE — Assessment & Plan Note (Signed)
-   Continue amlodipine, olmesartan, Coreg

## 2022-03-25 NOTE — Assessment & Plan Note (Signed)
-   Glucose 561, bicarb 19, gap 15, pH on VBG is pending -Patient given 1 L bolus and continued on LR at 125 mils per hour -Potassium 5.3 we will continue to monitor with every 4 BMP -Continue insulin drip -Continue to monitor CBG -Admit to stepdown

## 2022-03-25 NOTE — Assessment & Plan Note (Signed)
Corrects for hyperglycemia Continue to monitor

## 2022-03-25 NOTE — Assessment & Plan Note (Signed)
Continue statin. 

## 2022-03-25 NOTE — ED Triage Notes (Signed)
Pt c/o hyperglycemia, blood sugars in the 500's.  Pt states she has no appetite.

## 2022-03-25 NOTE — Assessment & Plan Note (Signed)
Creatinine is very near to baseline GFR mid 20s Continue to monitor

## 2022-03-25 NOTE — ED Notes (Signed)
Hospitalist at bedside 

## 2022-03-25 NOTE — H&P (Signed)
History and Physical    Patient: Emily Jennings MRN:[id number] DOB: 08/06/1934 DOA: 03/25/2022 DOS: the patient was seen and examined on 03/25/2022 PCP: Simpson, Margaret E, MD  Patient coming from: Home  Chief Complaint:  Chief Complaint  Patient presents with   Hyperglycemia   HPI: Emily Jennings is a 88 y.o. female with medical history significant of anxiety, GERD, diabetes mellitus type 2 hypertension, hyperlipidemia, and more presents to ED with a chief complaint of hyperglycemia.  Patient had just been hospitalized for hypoglycemia.  Initially when she came in her glucose was 16.  Looks like she had recently had an increase in her insulin with her endocrinologist.  She reports that when she was discharged she was not advised to take any insulin.  Although she does have Humalog on her discharge med rec.  It was reduced from 15 units to 10 units twice daily.  Patient reports that when she got home she started noticing her sugar going up.  Last night it was 400s.  When she woke up this morning her fasting glucose was 500s.  She felt nauseous.  She had polyuria and polydipsia but no polyphasia.  She tried to drink some lemon juice to see if that would make the sugar go down and it did not.  She decided she had to come back into the ER.  Patient did have 1 episode of emesis that was nonbloody.  She denies any chest pain, headaches, dyspnea, dysuria.  She reports that she has had palpitations that started this morning.  They are intermittent.  They seem to be worse with exertion.  We will monitor for significant events with telemetry.  At last admission she was here for syncope, but she has not had any near syncope or syncopal events.  She has not felt dizzy.  She has no other complaints at this time.  Patient does not smoke, does not drink alcohol, does not use illicit drugs.  She is vaccinated for COVID.  Patient is full code. Review of Systems: As mentioned in the history of present illness. All other  systems reviewed and are negative. Past Medical History:  Diagnosis Date   Allergy    Anxiety    ANXIETY DISORDER, GENERALIZED 07/15/2007   Qualifier: Diagnosis of  By: Dickerson, Nicky     Arthritis    Cancer (HCC) 2009   breast, carcinoma in situ left   Carcinoma in situ of breast 05/21/2008   Qualifier: Diagnosis of  By: Simpson MD, Margaret  Diagnosed in 2009, completed 5 year course of tamoxifen, no evidence of recurrence    Carotid stenosis    11/16/2005  mild plaque formation and stenosis proximal right ECA   Cataract    Complication of anesthesia    Coronary artery disease    cardiac catheterization on 03/20/2006  LAD mid 40% stenosis, left circumflex mild 40% stenosis, RCA mid-vessel 40% to 50% lesion   EF 60%   Diabetes mellitus    GERD (gastroesophageal reflux disease)    Hernia, inguinal    left   Hyperglycemia    Hypertension    Insomnia 11/16/2011   Low blood potassium    Non-insulin dependent type 2 diabetes mellitus (HCC)    Osteoporosis    Shortness of breath    2D Echocardiogram 01/26/2009   EF of greater than 55%, mild MR, mild TR, normal ventricular function   Thickened endometrium 10/26/2017   Noted by gyne in 2017, missed 6 month follow up, referred in   09/2017   Ventricular tachycardia, non-sustained (HCC)    developed during stress test 02/08/2006, spontaneously aborted, mild reversible apical defect   Past Surgical History:  Procedure Laterality Date   BREAST LUMPECTOMY Left 2009   Left breast 2009   CATARACT EXTRACTION W/PHACO Left 10/28/2014   Procedure: PHACO EMULSION CATARACT EXTRACTION WITH INTRAOCULAR LENS IMPLANT LEFT EYE (IOC);  Surgeon: Roy Whitaker, MD;  Location: [facility] OR;  Service: Ophthalmology;  Laterality: Left;   COLONOSCOPY     cyst removed from left foot     REFRACTIVE SURGERY Left    Social History:  reports that she has never smoked. She has never used smokeless tobacco. She reports that she does not drink alcohol and does not use  drugs.  Allergies  Allergen Reactions   Benadryl [Diphenhydramine Hcl] Hypertension   Citalopram    Metformin And Related Diarrhea    Lost appetite and weight    Tramadol Other (See Comments)    Felt light headed and dizzy   Crestor [Rosuvastatin] Other (See Comments)    "Feet swelling", makes her feel weak    Family History  Problem Relation Age of Onset   Hypertension Mother    Hyperlipidemia Mother    Stroke Mother    Urticaria Mother    Cancer Father        pancreatic   Colon cancer Father    Heart disease Brother 40       bypass   Heart disease Brother 69       bypass   Arthritis Other    Asthma Other    Diabetes Other    Colon cancer Paternal Aunt    Esophageal cancer Neg Hx    Stomach cancer Neg Hx    Rectal cancer Neg Hx     Prior to Admission medications   Medication Sig Start Date End Date Taking? Authorizing Provider  acetaminophen (TYLENOL) 500 MG tablet Take one tablet two times daily for 3 days, then as needed 08/27/18   Simpson, Margaret E, MD  alendronate (FOSAMAX) 70 MG tablet TAKE 1 TABLET BY MOUTH EVERY 7 DAYS. TAKE WITH A FULL GLASS OF WATER ON AN EMPTY STOMACH Patient taking differently: Take 70 mg by mouth once a week. TAKE 1 TABLET BY MOUTH EVERY 7 DAYS. TAKE WITH A FULL GLASS OF WATER ON AN EMPTY STOMACH 01/04/22   Simpson, Margaret E, MD  amLODipine-olmesartan (AZOR) 10-40 MG tablet TAKE 1 TABLET BY MOUTH EVERY DAY 01/19/22   Simpson, Margaret E, MD  aspirin EC 81 MG tablet Take 81 mg by mouth daily.    [provider]  blood glucose meter kit and supplies KIT One touch Ultra. Use three times daily as directed. (FOR ICD E11.65) 10/27/20   [name], Gebreselassie W, MD  carvedilol (COREG) 12.5 MG tablet TAKE 1 TABLET (12.5MG TOTAL) BY MOUTH TWICE A DAY WITH MEALS Patient taking differently: Take 12.5 mg by mouth 2 (two) times daily with a meal. 01/20/22   Simpson, Margaret E, MD  cholecalciferol (VITAMIN D3) 25 MCG (1000 UT) tablet Take 2,000 Units  by mouth daily.    [provider]  dicyclomine (BENTYL) 10 MG capsule Take 1 capsule (10 mg total) by mouth 3 (three) times daily before meals. 12/21/21   Stark, Malcolm T, MD  glucose blood (ONETOUCH ULTRA) test strip USE TO TEST 2 TIMES A DAY 02/09/22   [name], Gebreselassie W, MD  HUMALOG MIX 75/25 (75-25) 100 UNIT/ML SUSP injection INJECT SUBCUTANEOUSLY 10 UNITS TWICE DAILY   Patient taking differently: Inject 10-15 Units into the skin See admin instructions. 15 units in the morning inject 10 units every evening 03/06/22   [name], Gebreselassie W, MD  Insulin Pen Needle 32G X 4 MM MISC 1 each by Does not apply route as directed. 01/22/20   [name], Gebreselassie W, MD  Insulin Syringe-Needle U-100 (BD INSULIN SYRINGE U/F) 31G X 5/16" 0.5 ML MISC USE AS DIRECTED TWICE DAILY 01/26/20   [name], Gebreselassie W, MD  Multiple Vitamins-Minerals (CENTRUM SILVER 50+WOMEN) TABS Take 1 tablet by mouth daily.    [provider]  mupirocin ointment (BACTROBAN) 2 % Apply topically 2 (two) times daily. 11/23/21   Simpson, Margaret E, MD  OneTouch Delica Lancets 33G MISC 1 each by Does not apply route 3 (three) times daily. Use to check glucose three times daily 12/06/20   Reardon, Whitney J, NP  potassium chloride SA (KLOR-CON M20) 20 MEQ tablet Take 1 tablet (20 mEq total) by mouth 2 (two) times daily. 02/09/22   Simpson, Margaret E, MD  RESTASIS 0.05 % ophthalmic emulsion Place 1 drop into both eyes every evening. 06/10/20   [provider]  rosuvastatin (CRESTOR) 5 MG tablet TAKE 1 TABLET (5 MG TOTAL) BY MOUTH DAILY. 01/19/22   Simpson, Margaret E, MD  spironolactone (ALDACTONE) 50 MG tablet TAKE 1 TABLET BY MOUTH EVERY DAY 01/11/22   Simpson, Margaret E, MD    Physical Exam: Vitals:   03/25/22 2111 03/25/22 2138  BP: (!) 163/81 (!) 160/66  Pulse: 88 87  Resp: 16 16  Temp: 98.1 F (36.7 C)   TempSrc: Oral   SpO2: 95% 96%   1.  General: Patient lying supine in bed,  no acute distress   2.  Psychiatric: Alert and oriented x 3, mood and behavior normal for situation, pleasant and cooperative with exam   3. Neurologic: Speech and language are normal, face is symmetric, moves all 4 extremities voluntarily, at baseline without acute deficits on limited exam   4. HEENMT:  Head is atraumatic, normocephalic, pupils reactive to light, neck is supple, trachea is midline, mucous membranes are moist   5. Respiratory : Lungs are clear to auscultation bilaterally without wheezing, rhonchi, rales, no cyanosis, no increase in work of breathing or accessory muscle use   6. Cardiovascular : Heart rate normal, rhythm is regular, positive for murmur, rubs or gallops, trace edema, peripheral pulses palpated   7. Gastrointestinal:  Abdomen is soft, nondistended, nontender to palpation bowel sounds active, no masses or organomegaly palpated   8. Skin:  Skin is warm, dry and intact without rashes, acute lesions, or ulcers on limited exam   9.Musculoskeletal:  No acute deformities or trauma, no asymmetry in tone, trace edema, peripheral pulses palpated, no tenderness to palpation in the extremities  Data Reviewed: In the ED Temp 98.1, heart rate 87-88, respiratory rate 16-16, blood pressure 160/66 satting at 96% No leukocytosis with a white blood cell count of 6.3, hemoglobin 11.5, platelets 201 Chemistry shows a pseudohyponatremia 127 that corrects for glucose of 561 Potassium is 5.3, chloride slightly low at 93, bicarb low at 19, creatinine very near to baseline at 1.96 Urine shows ketones, protein, greater than 500 glucose urea Patient was started on insulin drip, given 1 L bolus, started on LR 125 mL/h Admission requested for further management of hyperglycemic crisis  Assessment and Plan: * Hyperglycemic crisis due to diabetes mellitus (HCC) - Glucose 561, bicarb 19, gap 15, pH on VBG is pending -Patient given 1   L bolus and continued on LR at 125 mils per hour -Potassium 5.3 we will  continue to monitor with every 4 BMP -Continue insulin drip -Continue to monitor CBG -Admit to stepdown  Essential hypertension - Continue amlodipine, olmesartan, Coreg  Stage 3b chronic kidney disease (HCC) Creatinine is very near to baseline GFR mid 20s Continue to monitor  Mixed hyperlipidemia - Continue statin  GERD (gastroesophageal reflux disease) -      Advance Care Planning:   Code Status: Full Code   Consults: None  Family Communication: Husband at bedside  Severity of Illness: The appropriate patient status for this patient is INPATIENT. Inpatient status is judged to be reasonable and necessary in order to provide the required intensity of service to ensure the patient's safety. The patient's presenting symptoms, physical exam findings, and initial radiographic and laboratory data in the context of their chronic comorbidities is felt to place them at high risk for further clinical deterioration. Furthermore, it is not anticipated that the patient will be medically stable for discharge from the hospital within 2 midnights of admission.   * I certify that at the point of admission it is my clinical judgment that the patient will require inpatient hospital care spanning beyond 2 midnights from the point of admission due to high intensity of service, high risk for further deterioration and high frequency of surveillance required.*  Author: Rolla Plate, DO 03/25/2022 11:41 PM  For on call review [email or web address].

## 2022-03-26 DIAGNOSIS — E1165 Type 2 diabetes mellitus with hyperglycemia: Secondary | ICD-10-CM | POA: Diagnosis not present

## 2022-03-26 DIAGNOSIS — E1010 Type 1 diabetes mellitus with ketoacidosis without coma: Secondary | ICD-10-CM | POA: Diagnosis not present

## 2022-03-26 DIAGNOSIS — E871 Hypo-osmolality and hyponatremia: Secondary | ICD-10-CM | POA: Diagnosis not present

## 2022-03-26 DIAGNOSIS — I10 Essential (primary) hypertension: Secondary | ICD-10-CM | POA: Diagnosis not present

## 2022-03-26 LAB — BASIC METABOLIC PANEL
Anion gap: 9 (ref 5–15)
Anion gap: 5 (ref 5–15)
Anion gap: 7 (ref 5–15)
BUN: 43 mg/dL — ABNORMAL HIGH (ref 8–23)
BUN: 37 mg/dL — ABNORMAL HIGH (ref 8–23)
BUN: 37 mg/dL — ABNORMAL HIGH (ref 8–23)
CO2: 21 mmol/L — ABNORMAL LOW (ref 22–32)
CO2: 26 mmol/L (ref 22–32)
CO2: 27 mmol/L (ref 22–32)
Calcium: 8.5 mg/dL — ABNORMAL LOW (ref 8.9–10.3)
Calcium: 9.1 mg/dL (ref 8.9–10.3)
Calcium: 9 mg/dL (ref 8.9–10.3)
Chloride: 103 mmol/L (ref 98–111)
Chloride: 106 mmol/L (ref 98–111)
Chloride: 106 mmol/L (ref 98–111)
Creatinine, Ser: 1.81 mg/dL — ABNORMAL HIGH (ref 0.44–1.00)
Creatinine, Ser: 1.54 mg/dL — ABNORMAL HIGH (ref 0.44–1.00)
Creatinine, Ser: 1.4 mg/dL — ABNORMAL HIGH (ref 0.44–1.00)
GFR, Estimated: 27 mL/min — ABNORMAL LOW (ref >60)
GFR, Estimated: 33 mL/min — ABNORMAL LOW (ref >60)
GFR, Estimated: 37 mL/min — ABNORMAL LOW (ref >60)
Glucose, Bld: 338 mg/dL — ABNORMAL HIGH (ref 70–99)
Glucose, Bld: 167 mg/dL — ABNORMAL HIGH (ref 70–99)
Glucose, Bld: 133 mg/dL — ABNORMAL HIGH (ref 70–99)
Potassium: 4.2 mmol/L (ref 3.5–5.1)
Potassium: 4.1 mmol/L (ref 3.5–5.1)
Potassium: 4.3 mmol/L (ref 3.5–5.1)
Sodium: 133 mmol/L — ABNORMAL LOW (ref 135–145)
Sodium: 137 mmol/L (ref 135–145)
Sodium: 140 mmol/L (ref 135–145)

## 2022-03-26 LAB — CBC WITH DIFFERENTIAL/PLATELET
Abs Immature Granulocytes: 0.01 10*3/uL (ref 0.00–0.07)
Basophils Absolute: 0 10*3/uL (ref 0.0–0.1)
Basophils Relative: 1 %
Eosinophils Absolute: 0.1 10*3/uL (ref 0.0–0.5)
Eosinophils Relative: 2 %
HCT: 29.3 % — ABNORMAL LOW (ref 36.0–46.0)
Hemoglobin: 9.8 g/dL — ABNORMAL LOW (ref 12.0–15.0)
Immature Granulocytes: 0 %
Lymphocytes Relative: 24 %
Lymphs Abs: 1.5 10*3/uL (ref 0.7–4.0)
MCH: 31.3 pg (ref 26.0–34.0)
MCHC: 33.4 g/dL (ref 30.0–36.0)
MCV: 93.6 fL (ref 80.0–100.0)
Monocytes Absolute: 0.9 10*3/uL (ref 0.1–1.0)
Monocytes Relative: 15 %
Neutro Abs: 3.6 10*3/uL (ref 1.7–7.7)
Neutrophils Relative %: 58 %
Platelets: 186 10*3/uL (ref 150–400)
RBC: 3.13 MIL/uL — ABNORMAL LOW (ref 3.87–5.11)
RDW: 12.6 % (ref 11.5–15.5)
WBC: 6.2 10*3/uL (ref 4.0–10.5)
nRBC: 0 % (ref 0.0–0.2)

## 2022-03-26 LAB — GLUCOSE, CAPILLARY
Glucose-Capillary: 280 mg/dL — ABNORMAL HIGH (ref 70–99)
Glucose-Capillary: 203 mg/dL — ABNORMAL HIGH (ref 70–99)
Glucose-Capillary: 174 mg/dL — ABNORMAL HIGH (ref 70–99)
Glucose-Capillary: 135 mg/dL — ABNORMAL HIGH (ref 70–99)
Glucose-Capillary: 124 mg/dL — ABNORMAL HIGH (ref 70–99)
Glucose-Capillary: 115 mg/dL — ABNORMAL HIGH (ref 70–99)
Glucose-Capillary: 125 mg/dL — ABNORMAL HIGH (ref 70–99)
Glucose-Capillary: 119 mg/dL — ABNORMAL HIGH (ref 70–99)
Glucose-Capillary: 134 mg/dL — ABNORMAL HIGH (ref 70–99)
Glucose-Capillary: 125 mg/dL — ABNORMAL HIGH (ref 70–99)
Glucose-Capillary: 132 mg/dL — ABNORMAL HIGH (ref 70–99)
Glucose-Capillary: 221 mg/dL — ABNORMAL HIGH (ref 70–99)
Glucose-Capillary: 174 mg/dL — ABNORMAL HIGH (ref 70–99)
Glucose-Capillary: 204 mg/dL — ABNORMAL HIGH (ref 70–99)
Glucose-Capillary: 129 mg/dL — ABNORMAL HIGH (ref 70–99)
Glucose-Capillary: 218 mg/dL — ABNORMAL HIGH (ref 70–99)

## 2022-03-26 LAB — BLOOD GAS, VENOUS
Acid-base deficit: 1.7 mmol/L (ref 0.0–2.0)
Bicarbonate: 23 mmol/L (ref 20.0–28.0)
Drawn by: 1517
FIO2: 21 %
O2 Saturation: 83.8 %
Patient temperature: 36.7
pCO2, Ven: 38 mmHg — ABNORMAL LOW (ref 44–60)
pH, Ven: 7.39 (ref 7.25–7.43)
pO2, Ven: 49 mmHg — ABNORMAL HIGH (ref 32–45)

## 2022-03-26 LAB — MRSA NEXT GEN BY PCR, NASAL: MRSA by PCR Next Gen: NOT DETECTED

## 2022-03-26 LAB — BETA-HYDROXYBUTYRIC ACID
Beta-Hydroxybutyric Acid: 4.33 mmol/L — ABNORMAL HIGH (ref 0.05–0.27)
Beta-Hydroxybutyric Acid: 0.32 mmol/L — ABNORMAL HIGH (ref 0.05–0.27)

## 2022-03-26 LAB — MAGNESIUM: Magnesium: 1.9 mg/dL (ref 1.7–2.4)

## 2022-03-26 LAB — CBG MONITORING, ED: Glucose-Capillary: 381 mg/dL — ABNORMAL HIGH (ref 70–99)

## 2022-03-26 MED ORDER — LACTATED RINGERS IV SOLN: INTRAVENOUS | Status: DC

## 2022-03-26 MED ORDER — CHLORHEXIDINE GLUCONATE CLOTH 2 % EX PADS
6.0000 | MEDICATED_PAD | Freq: Every day | CUTANEOUS | Status: DC
  Administered 2022-03-26 – 2022-03-27 (×2): 6 via TOPICAL

## 2022-03-26 MED ORDER — INSULIN ASPART 100 UNIT/ML IJ SOLN
0.0000 [IU] | Freq: Three times a day (TID) | INTRAMUSCULAR | Status: DC
  Administered 2022-03-26: 2 [IU] via SUBCUTANEOUS
  Administered 2022-03-27: 3 [IU] via SUBCUTANEOUS
  Administered 2022-03-27: 11 [IU] via SUBCUTANEOUS

## 2022-03-26 MED ORDER — INSULIN GLARGINE-YFGN 100 UNIT/ML ~~LOC~~ SOLN
10.0000 [IU] | SUBCUTANEOUS | Status: DC
Start: 2022-03-26 — End: 2022-03-27
  Administered 2022-03-26: 10 [IU] via SUBCUTANEOUS
  Filled 2022-03-26 (×2): qty 0.1

## 2022-03-26 NOTE — ED Provider Notes (Signed)
[facility] EMERGENCY DEPARTMENT Provider Note   CSN: [id number] Arrival date & time: 03/25/22  2104     History  Chief Complaint  Patient presents with   Hyperglycemia    Emily Jennings is a 88 y.o. female.  HPI Patient presents 1 day after being urged from this facility.  She was admitted 5 days ago following an episode of hypoglycemia with a glucose of 16.  She notes that she did not feel quite right on discharge, and today has felt off, with mild nausea, and has had persistent hyperglycemia throughout the day in spite of taking her medication this morning.  No fever, fall, chills, vomiting.    Home Medications Prior to Admission medications   Medication Sig Start Date End Date Taking? Authorizing Provider  acetaminophen (TYLENOL) 500 MG tablet Take one tablet two times daily for 3 days, then as needed 08/27/18   [name], MD  alendronate (FOSAMAX) 70 MG tablet TAKE 1 TABLET BY MOUTH EVERY 7 DAYS. TAKE WITH A FULL GLASS OF WATER ON AN EMPTY STOMACH Patient taking differently: Take 70 mg by mouth once a week. TAKE 1 TABLET BY MOUTH EVERY 7 DAYS. TAKE WITH A FULL GLASS OF WATER ON AN EMPTY STOMACH 01/04/22   [name], MD  amLODipine-olmesartan (AZOR) 10-40 MG tablet TAKE 1 TABLET BY MOUTH EVERY DAY 01/19/22   [name], MD  aspirin EC 81 MG tablet Take 81 mg by mouth daily.    [provider]  blood glucose meter kit and supplies KIT One touch Ultra. Use three times daily as directed. (FOR ICD E11.65) 10/27/20   Cassandria Anger, MD  carvedilol (COREG) 12.5 MG tablet TAKE 1 TABLET (12.5MG TOTAL) BY MOUTH TWICE A DAY WITH MEALS Patient taking differently: Take 12.5 mg by mouth 2 (two) times daily with a meal. 01/20/22   [name], MD  cholecalciferol (VITAMIN D3) 25 MCG (1000 UT) tablet Take 2,000 Units by mouth daily.    [provider]  dicyclomine (BENTYL) 10 MG capsule Take 1 capsule (10 mg total) by mouth 3 (three) times  daily before meals. 12/21/21   Ladene Artist, MD  glucose blood (ONETOUCH ULTRA) test strip USE TO TEST 2 TIMES A DAY 02/09/22   Cassandria Anger, MD  HUMALOG MIX 75/25 (75-25) 100 UNIT/ML SUSP injection INJECT SUBCUTANEOUSLY 10 UNITS TWICE DAILY Patient taking differently: Inject 10-15 Units into the skin See admin instructions. 15 units in the morning inject 10 units every evening 03/06/22   Cassandria Anger, MD  Insulin Pen Needle 32G X 4 MM MISC 1 each by Does not apply route as directed. 01/22/20   Cassandria Anger, MD  Insulin Syringe-Needle U-100 (BD INSULIN SYRINGE U/F) 31G X 5/16" 0.5 ML MISC USE AS DIRECTED TWICE DAILY 01/26/20   [name], [name], MD  Multiple Vitamins-Minerals (CENTRUM SILVER 50+WOMEN) TABS Take 1 tablet by mouth daily.    [provider]  mupirocin ointment (BACTROBAN) 2 % Apply topically 2 (two) times daily. 11/23/21   [name], MD  OneTouch Delica Lancets 25K MISC 1 each by Does not apply route 3 (three) times daily. Use to check glucose three times daily 12/06/20   Brita Romp, NP  potassium chloride SA (KLOR-CON M20) 20 MEQ tablet Take 1 tablet (20 mEq total) by mouth 2 (two) times daily. 02/09/22   [name], MD  RESTASIS 0.05 % ophthalmic emulsion Place 1 drop into both eyes  every evening. 06/10/20   [provider]  rosuvastatin (CRESTOR) 5 MG tablet TAKE 1 TABLET (5 MG TOTAL) BY MOUTH DAILY. 01/19/22   [name], MD  spironolactone (ALDACTONE) 50 MG tablet TAKE 1 TABLET BY MOUTH EVERY DAY 01/11/22   [name], MD      Allergies    Benadryl [diphenhydramine hcl], Citalopram, Metformin and related, Tramadol, and Crestor [rosuvastatin]    Review of Systems   Review of Systems  All other systems reviewed and are negative.   Physical Exam Updated Vital Signs BP (!) 160/66   Pulse 87   Temp 98.1 F (36.7 C) (Oral)   Resp 16   SpO2 96%  Physical Exam Vitals and nursing note  reviewed.  Constitutional:      General: She is not in acute distress.    Appearance: She is well-developed.  HENT:     Head: Normocephalic and atraumatic.  Eyes:     Conjunctiva/sclera: Conjunctivae normal.  Cardiovascular:     Rate and Rhythm: Normal rate and regular rhythm.  Pulmonary:     Effort: Pulmonary effort is normal. No respiratory distress.     Breath sounds: Normal breath sounds. No stridor.  Abdominal:     General: There is no distension.  Skin:    General: Skin is warm and dry.  Neurological:     Mental Status: She is alert and oriented to person, place, and time.     Cranial Nerves: No cranial nerve deficit.  Psychiatric:        Mood and Affect: Mood normal.     ED Results / Procedures / Treatments   Labs (all labs ordered are listed, but only abnormal results are displayed) Labs Reviewed  BASIC METABOLIC PANEL - Abnormal; Notable for the following components:      Result Value   Sodium 127 (*)    Potassium 5.3 (*)    Chloride 93 (*)    CO2 19 (*)    Glucose, Bld 561 (*)    BUN 47 (*)    Creatinine, Ser 1.96 (*)    GFR, Estimated 24 (*)    All other components within normal limits  CBC - Abnormal; Notable for the following components:   RBC 3.65 (*)    Hemoglobin 11.5 (*)    HCT 34.6 (*)    All other components within normal limits  URINALYSIS, ROUTINE W REFLEX MICROSCOPIC - Abnormal; Notable for the following components:   Color, Urine STRAW (*)    Glucose, UA >=500 (*)    Hgb urine dipstick SMALL (*)    Ketones, ur 20 (*)    Protein, ur 100 (*)    All other components within normal limits  CBG MONITORING, ED - Abnormal; Notable for the following components:   Glucose-Capillary 544 (*)    All other components within normal limits  CBG MONITORING, ED - Abnormal; Notable for the following components:   Glucose-Capillary 522 (*)    All other components within normal limits  CBG MONITORING, ED - Abnormal; Notable for the following components:    Glucose-Capillary 453 (*)    All other components within normal limits  CBG MONITORING, ED - Abnormal; Notable for the following components:   Glucose-Capillary 430 (*)    All other components within normal limits  BETA-HYDROXYBUTYRIC ACID  BETA-HYDROXYBUTYRIC ACID  BASIC METABOLIC PANEL  BASIC METABOLIC PANEL  BASIC METABOLIC PANEL  MAGNESIUM  CBC WITH DIFFERENTIAL/PLATELET  BETA-HYDROXYBUTYRIC ACID  BASIC METABOLIC PANEL  BASIC  METABOLIC PANEL  BASIC METABOLIC PANEL  BLOOD GAS, VENOUS    EKG EKG Interpretation  Date/Time:  Saturday March 25 2022 23:41:58 EDT Ventricular Rate:  95 PR Interval:  214 QRS Duration: 87 QT Interval:  347 QTC Calculation: 437 R Axis:   96 Text Interpretation: Sinus rhythm Borderline prolonged PR interval RAE, consider biatrial enlargement Right ventricular hypertrophy Abnormal ECG Confirmed by Carmin Muskrat [id number]) on 03/26/2022 12:11:47 AM  Radiology No results found.  Procedures Procedures    Medications Ordered in ED Medications  insulin regular, human (MYXREDLIN) 100 units/ 100 mL infusion (7 Units/hr Intravenous New Bag/Given 03/25/22 2316)  lactated ringers infusion ( Intravenous New Bag/Given 03/25/22 2319)  dextrose 5 % in lactated ringers infusion (0 mLs Intravenous Hold 03/25/22 2320)  dextrose 50 % solution 0-50 mL (has no administration in time range)  aspirin EC tablet 81 mg (has no administration in time range)  carvedilol (COREG) tablet 12.5 mg (has no administration in time range)  rosuvastatin (CRESTOR) tablet 5 mg (has no administration in time range)  spironolactone (ALDACTONE) tablet 50 mg (has no administration in time range)  heparin injection 5,000 Units (has no administration in time range)  acetaminophen (TYLENOL) tablet 650 mg (has no administration in time range)    Or  acetaminophen (TYLENOL) suppository 650 mg (has no administration in time range)  oxyCODONE (Oxy IR/ROXICODONE) immediate release tablet 5 mg (has no  administration in time range)  morphine (PF) 2 MG/ML injection 2 mg (has no administration in time range)  ondansetron (ZOFRAN) tablet 4 mg (has no administration in time range)    Or  ondansetron (ZOFRAN) injection 4 mg (has no administration in time range)  amLODipine (NORVASC) tablet 10 mg (has no administration in time range)    And  irbesartan (AVAPRO) tablet 300 mg (has no administration in time range)  lactated ringers bolus 1,066 mL (1,066 mLs Intravenous New Bag/Given 03/25/22 2314)    ED Course/ Medical Decision Making/ A&P This patient with a Hx of insulin-dependent diabetes, recent hospitalization for hypoglycemia presents to the ED for concern of hyperglycemia, nausea, weak, this involves an extensive number of treatment options, and is a complaint that carries with it a high risk of complications and morbidity.    The differential diagnosis includes DKA, nonketotic hyperosmolar state,   Social Determinants of Health:  Advanced age  Additional history obtained:  Additional history and/or information obtained from chart review and, notable for review notable for discharge summary following admission for hypoglycemia   After the initial evaluation, orders, including: Monitoring, fluids, labs were initiated.   Patient placed on Cardiac and Pulse-Oximetry Monitors. The patient was maintained on a cardiac monitor.  The cardiac monitored showed an rhythm of 90 sinus normal The patient was also maintained on pulse oximetry. The readings were typically 100% room air normal   On repeat evaluation of the patient improved  Lab Tests:  I personally interpreted labs.  The pertinent results include: Hyperglycemia with anion gap 15, ketone urea consistent with DKA  Consultations Obtained:  I requested consultation with the internal medicine,  and discussed lab and imaging findings as well as pertinent plan - they recommend: Admission  Dispostion / Final MDM:  After  consideration of the diagnostic results and the patient's response to treatment, this pleasant elderly female presents with nausea, weakness, is found have hyperglycemia with labs consistent with mild DKA requiring continuous insulin, fluids, admission for monitoring, management.  No early evidence for concurrent infection, mental status changes.  Final Clinical Impression(s) / ED Diagnoses Final diagnoses:  Diabetic ketoacidosis without coma associated with type 1 diabetes mellitus ([place])  CRITICAL CARE Performed by: Carmin Muskrat Total critical care time: 35 minutes Critical care time was exclusive of separately billable procedures and treating other patients. Critical care was necessary to treat or prevent imminent or life-threatening deterioration. Critical care was time spent personally by me on the following activities: development of treatment plan with patient and/or surrogate as well as nursing, discussions with consultants, evaluation of patient's response to treatment, examination of patient, obtaining history from patient or surrogate, ordering and performing treatments and interventions, ordering and review of laboratory studies, ordering and review of radiographic studies, pulse oximetry and re-evaluation of patient's condition.    Carmin Muskrat, MD 03/26/22 [id number]

## 2022-03-26 NOTE — Progress Notes (Signed)
PROGRESS NOTE    Emily Jennings  PPI:[id number] DOB: 1934/08/18 DOA: 03/25/2022 PCP: [name], MD    Brief Narrative:  88 year old female with a history of insulin-dependent diabetes, recently admitted to the hospital with hypoglycemia when she accidentally took a large amount of insulin that was prescribed.  The patient was monitored in the hospital and discharged home with stable blood sugars.  After returning home, she was confused about her insulin regimen and did not take any insulin, thinking that taking the insulin would raise her blood sugar further.  She was readmitted with mild diabetic ketoacidosis.   Assessment & Plan:   Principal Problem:   Hyperglycemic crisis due to diabetes mellitus ([place]) Active Problems:   Essential hypertension   Stage 3b chronic kidney disease (HCC)   Mixed hyperlipidemia   Hyponatremia   Diabetic ketoacidosis, mild -Related to patient not taking her insulin at home -Started on IV fluids and intravenous insulin -Overall anion gap is closed, blood sugars have improved -She can transition off of insulin infusion -We will give 1 dose of Semglee this morning, can consider restarting 70/30 this evening  Type 2 diabetes, insulin-dependent, uncontrolled with hyperglycemia -Patient was recently discharged from the hospital after an episode of hypoglycemia.  When she returned home, she did not take her insulin despite blood sugars running high.  She had some confusion thinking that taking insulin might raise her sugar further. -Patient was educated on the importance of insulin and management of her blood sugar -We will go through further teaching prior to discharge  Chronic kidney disease stage IIIb -Creatinine history of baseline -Continue to monitor  Hyperlipidemia -Continue statin   DVT prophylaxis: heparin injection 5,000 Units Start: 03/25/22 2345 SCDs Start: 03/25/22 2341  Code Status: full code Family Communication: discussed  with family at bedside Disposition Plan: Status is: Inpatient Remains inpatient appropriate because: Continued management of blood sugars with IV insulin     Consultants:    Procedures:    Antimicrobials:      Subjective: Patient is feeling better. No vomiting.   Objective: Vitals:   03/26/22 0800 03/26/22 0900 03/26/22 1000 03/26/22 1100  BP: (!) 160/67 (!) 160/63 136/60 (!) 152/55  Pulse: 72 75 71 71  Resp: '20 14 11 16  '$ Temp:    98.7 F (37.1 C)  TempSrc:    Oral  SpO2: 100% 100% 100% 100%  Weight:      Height:        Intake/Output Summary (Last 24 hours) at 03/26/2022 1130 Last data filed at 03/26/2022 0900 Gross per 24 hour  Intake 1721.46 ml  Output 1900 ml  Net -178.54 ml   Filed Weights   03/26/22 0139 03/26/22 0409  Weight: 53.6 kg 53.6 kg    Examination:  General exam: Appears calm and comfortable  Respiratory system: Clear to auscultation. Respiratory effort normal. Cardiovascular system: S1 & S2 heard, RRR. No JVD, murmurs, rubs, gallops or clicks. No pedal edema. Gastrointestinal system: Abdomen is nondistended, soft and nontender. No organomegaly or masses felt. Normal bowel sounds heard. Central nervous system: Alert and oriented. No focal neurological deficits. Extremities: Symmetric 5 x 5 power. Skin: No rashes, lesions or ulcers Psychiatry: Judgement and insight appear normal. Mood & affect appropriate.     Data Reviewed: I have personally reviewed following labs and imaging studies  CBC: Recent Labs  Lab 03/22/22 1600 03/23/22 0131 03/25/22 2149 03/26/22 0415  WBC 10.3 7.3 6.3 6.2  NEUTROABS  --   --   --  3.6  HGB 12.1 10.3* 11.5* 9.8*  HCT 36.4 31.9* 34.6* 29.3*  MCV 96.3 97.9 94.8 93.6  PLT 230 194 201 782   Basic Metabolic Panel: Recent Labs  Lab 03/23/22 0131 03/23/22 1200 03/25/22 2149 03/26/22 0107 03/26/22 0415 03/26/22 0713  NA 134*  --  127* 133* 137 140  K 4.9  --  5.3* 4.2 4.1 4.3  CL 105  --  93* 103 106  106  CO2 20*  --  19* 21* 26 27  GLUCOSE 405* 535* 561* 338* 167* 133*  BUN 41*  --  47* 43* 37* 37*  CREATININE 1.88*  --  1.96* 1.81* 1.54* 1.40*  CALCIUM 8.9  --  9.1 8.5* 9.1 9.0  MG  --   --   --   --  1.9  --    GFR: Estimated Creatinine Clearance: 21.8 mL/min (A) (by C-G formula based on SCr of 1.4 mg/dL (H)). Liver Function Tests: Recent Labs  Lab 03/22/22 1600  AST 30  ALT 20  ALKPHOS 62  BILITOT 0.6  PROT 6.8  ALBUMIN 3.9   No results for input(s): "LIPASE", "AMYLASE" in the last 168 hours. No results for input(s): "AMMONIA" in the last 168 hours. Coagulation Profile: No results for input(s): "INR", "PROTIME" in the last 168 hours. Cardiac Enzymes: No results for input(s): "CKTOTAL", "CKMB", "CKMBINDEX", "TROPONINI" in the last 168 hours. BNP (last 3 results) No results for input(s): "PROBNP" in the last 8760 hours. HbA1C: No results for input(s): "HGBA1C" in the last 72 hours. CBG: Recent Labs  Lab 03/26/22 0639 03/26/22 0744 03/26/22 0846 03/26/22 0946 03/26/22 1038  GLUCAP 124* 115* 125* 119* 134*   Lipid Profile: No results for input(s): "CHOL", "HDL", "LDLCALC", "TRIG", "CHOLHDL", "LDLDIRECT" in the last 72 hours. Thyroid Function Tests: No results for input(s): "TSH", "T4TOTAL", "FREET4", "T3FREE", "THYROIDAB" in the last 72 hours. Anemia Panel: No results for input(s): "VITAMINB12", "FOLATE", "FERRITIN", "TIBC", "IRON", "RETICCTPCT" in the last 72 hours. Sepsis Labs: No results for input(s): "PROCALCITON", "LATICACIDVEN" in the last 168 hours.  Recent Results (from the past 240 hour(s))  MRSA Next Gen by PCR, Nasal     Status: None   Collection Time: 03/26/22  2:22 AM   Specimen: Nasal Mucosa; Nasal Swab  Result Value Ref Range Status   MRSA by PCR Next Gen NOT DETECTED NOT DETECTED Final    Comment: (NOTE) The GeneXpert MRSA Assay (FDA approved for NASAL specimens only), is one component of a comprehensive MRSA colonization  surveillance program. It is not intended to diagnose MRSA infection nor to guide or monitor treatment for MRSA infections. Test performance is not FDA approved in patients less than 58 years old. Performed at [facility], [address] [place], [place] [address]          Radiology Studies: No results found.      Scheduled Meds:  amLODipine  10 mg Oral Daily   And   irbesartan  300 mg Oral Daily   aspirin EC  81 mg Oral Daily   carvedilol  12.5 mg Oral BID WC   heparin  5,000 Units Subcutaneous Q8H   insulin aspart  0-15 Units Subcutaneous TID WC   insulin glargine-yfgn  10 Units Subcutaneous Q24H   rosuvastatin  5 mg Oral Daily   spironolactone  50 mg Oral Daily   Continuous Infusions:  insulin 0.9 Units/hr (03/26/22 1115)   lactated ringers       LOS: 1 day  Time spent: 44mns    JKathie Dike MD [facility]   If 7PM-7AM, please contact night-coverage [email or web address]  03/26/2022, 11:30 AM

## 2022-03-27 DIAGNOSIS — E871 Hypo-osmolality and hyponatremia: Secondary | ICD-10-CM | POA: Diagnosis not present

## 2022-03-27 DIAGNOSIS — E1010 Type 1 diabetes mellitus with ketoacidosis without coma: Secondary | ICD-10-CM | POA: Diagnosis not present

## 2022-03-27 DIAGNOSIS — E1165 Type 2 diabetes mellitus with hyperglycemia: Secondary | ICD-10-CM | POA: Diagnosis not present

## 2022-03-27 DIAGNOSIS — I10 Essential (primary) hypertension: Secondary | ICD-10-CM | POA: Diagnosis not present

## 2022-03-27 LAB — BASIC METABOLIC PANEL
Anion gap: 9 (ref 5–15)
BUN: 26 mg/dL — ABNORMAL HIGH (ref 8–23)
CO2: 25 mmol/L (ref 22–32)
Calcium: 8.9 mg/dL (ref 8.9–10.3)
Chloride: 105 mmol/L (ref 98–111)
Creatinine, Ser: 1.4 mg/dL — ABNORMAL HIGH (ref 0.44–1.00)
GFR, Estimated: 37 mL/min — ABNORMAL LOW (ref >60)
Glucose, Bld: 321 mg/dL — ABNORMAL HIGH (ref 70–99)
Potassium: 3.9 mmol/L (ref 3.5–5.1)
Sodium: 139 mmol/L (ref 135–145)

## 2022-03-27 LAB — GLUCOSE, CAPILLARY
Glucose-Capillary: 325 mg/dL — ABNORMAL HIGH (ref 70–99)
Glucose-Capillary: 185 mg/dL — ABNORMAL HIGH (ref 70–99)

## 2022-03-27 MED ORDER — INSULIN ASPART PROT & ASPART (70-30 MIX) 100 UNIT/ML ~~LOC~~ SUSP
15.0000 [IU] | Freq: Two times a day (BID) | SUBCUTANEOUS | Status: DC
Start: 2022-03-27 — End: 2022-03-27
  Administered 2022-03-27: 15 [IU] via SUBCUTANEOUS
  Filled 2022-03-27: qty 10

## 2022-03-27 NOTE — TOC Progression Note (Signed)
  Transition of Care [facility]) Screening Note   Patient Details  Name: Emily Jennings Date of Birth: 1934/08/02   Transition of Care [facility]) CM/SW Contact:    Boneta Lucks, RN Phone Number: 03/27/2022, 2:48 PM    Transition of Care Department [facility]) has reviewed patient and no TOC needs have been identified at this time. We will continue to monitor patient advancement through interdisciplinary progression rounds. If new patient transition needs arise, please place a TOC consult.     Expected Discharge Plan: Home/Self Care Barriers to Discharge: No Barriers Identified  Expected Discharge Plan and Services Expected Discharge Plan: Home/Self Care       Living arrangements for the past 2 months: Single Family Home Expected Discharge Date: 03/27/22

## 2022-03-27 NOTE — Inpatient Diabetes Management (Signed)
Inpatient Diabetes Program Recommendations  AACE/ADA: New Consensus Statement on Inpatient Glycemic Control (2015)  Target Ranges:  Prepandial:   less than 140 mg/dL      Peak postprandial:   less than 180 mg/dL (1-2 hours)      Critically ill patients:  140 - 180 mg/dL   Lab Results  Component Value Date   GLUCAP 185 (H) 03/27/2022   HGBA1C 7.2 (H) 03/22/2022    Spoke with pt and husband on the phone to review how and when to take her Humalog 75/25 at home. Discussed with pt and husband about the potential to change insulins. Encouraged them to follow up and contact Dr. Liliane Channel office this week for follow up and insulin titration.  Thanks,  Tama Headings RN, MSN, BC-ADM Inpatient Diabetes Coordinator Team Pager [phone] (8a-5p)

## 2022-03-27 NOTE — Progress Notes (Signed)
Ok to stop IV fluids per verbal from Dr Roderic Palau.

## 2022-03-27 NOTE — Progress Notes (Signed)
Pt and husband spoke with Diabetes Coordinator. Pt d/c at this timeNAD

## 2022-03-27 NOTE — Progress Notes (Signed)
SWOT nurse went over discharge summary with patient per Alyse Low RN (SWOT).

## 2022-03-27 NOTE — Discharge Summary (Signed)
Physician Discharge Summary  Emily Jennings:[id number] DOB: 04-05-1934 DOA: 03/25/2022  PCP: [name], MD  Admit date: 03/25/2022 Discharge date: 03/27/2022  Admitted From: Home Disposition: Home  Recommendations for Outpatient Follow-up:  Follow up with PCP in 1-2 weeks Please obtain BMP/CBC in one week Follow-up with endocrinology   Discharge Condition: Stable CODE STATUS: Full code Diet recommendation: Heart healthy  Brief/Interim Summary: 88 year old female with a history of insulin-dependent diabetes, recently admitted to the hospital with hypoglycemia when she accidentally took a large amount of insulin that was prescribed.  The patient was monitored in the hospital and discharged home with stable blood sugars.  After returning home, she was confused about her insulin regimen and did not take any insulin, thinking that taking the insulin would raise her blood sugar further.  She was readmitted with mild diabetic ketoacidosis.  Discharge Diagnoses:  Principal Problem:   Hyperglycemic crisis due to diabetes mellitus ([place]) Active Problems:   Essential hypertension   Stage 3b chronic kidney disease (HCC)   Mixed hyperlipidemia   Hyponatremia  Diabetic ketoacidosis, mild -Related to patient not taking her insulin at home -Started on IV fluids and intravenous insulin -Overall anion gap is closed, blood sugars have improved -She was transitioned off of insulin infusion -She is now back on 70/30 insulin   Type 2 diabetes, insulin-dependent, uncontrolled with hyperglycemia -Patient was recently discharged from the hospital after an episode of hypoglycemia.  When she returned home, she did not take her insulin despite blood sugars running high.  She had some confusion thinking that taking insulin might raise her sugar further. -Patient was educated on the importance of insulin and management of her blood sugar -We will go through further teaching prior to  discharge -Blood sugars currently stable on 70/30 insulin -She will need follow-up with endocrinology   Chronic kidney disease stage IIIb -Creatinine history of baseline -Continue to monitor   Hyperlipidemia -Continue statin  Discharge Instructions  Discharge Instructions     Diet - low sodium heart healthy   Complete by: As directed    Increase activity slowly   Complete by: As directed       Allergies as of 03/27/2022       Reactions   Benadryl [diphenhydramine Hcl] Hypertension   Citalopram Other (See Comments)   unknown   Metformin And Related Diarrhea   Lost appetite and weight    Tramadol Other (See Comments)   Felt light headed and dizzy   Crestor [rosuvastatin] Other (See Comments)   "Feet swelling", makes her feel weak        Medication List     TAKE these medications    acetaminophen 500 MG tablet Commonly known as: TYLENOL Take one tablet two times daily for 3 days, then as needed   alendronate 70 MG tablet Commonly known as: FOSAMAX TAKE 1 TABLET BY MOUTH EVERY 7 DAYS. TAKE WITH A FULL GLASS OF WATER ON AN EMPTY STOMACH What changed: See the new instructions.   amLODipine-olmesartan 10-40 MG tablet Commonly known as: AZOR TAKE 1 TABLET BY MOUTH EVERY DAY   aspirin EC 81 MG tablet Take 81 mg by mouth daily.   blood glucose meter kit and supplies Kit One touch Ultra. Use three times daily as directed. (FOR ICD E11.65)   carvedilol 12.5 MG tablet Commonly known as: COREG TAKE 1 TABLET (12.5MG TOTAL) BY MOUTH TWICE A DAY WITH MEALS What changed: See the new instructions.   Centrum Silver 50+Women Tabs Take  1 tablet by mouth daily.   cholecalciferol 25 MCG (1000 UNIT) tablet Commonly known as: VITAMIN D3 Take 2,000 Units by mouth daily.   dicyclomine 10 MG capsule Commonly known as: BENTYL Take 1 capsule (10 mg total) by mouth 3 (three) times daily before meals.   HumaLOG Mix 75/25 (75-25) 100 UNIT/ML Susp injection Generic drug:  insulin lispro protamine-lispro INJECT SUBCUTANEOUSLY 10 UNITS TWICE DAILY What changed: See the new instructions.   Insulin Pen Needle 32G X 4 MM Misc 1 each by Does not apply route as directed.   Insulin Syringe-Needle U-100 31G X 5/16" 0.5 ML Misc Commonly known as: BD Insulin Syringe U/F USE AS DIRECTED TWICE DAILY   mupirocin ointment 2 % Commonly known as: BACTROBAN Apply topically 2 (two) times daily.   OneTouch Delica Lancets 12Y Misc 1 each by Does not apply route 3 (three) times daily. Use to check glucose three times daily   OneTouch Ultra test strip Generic drug: glucose blood USE TO TEST 2 TIMES A DAY   potassium chloride SA 20 MEQ tablet Commonly known as: Klor-Con M20 Take 1 tablet (20 mEq total) by mouth 2 (two) times daily.   Restasis 0.05 % ophthalmic emulsion Generic drug: cycloSPORINE Place 1 drop into both eyes every evening.   rosuvastatin 5 MG tablet Commonly known as: CRESTOR TAKE 1 TABLET (5 MG TOTAL) BY MOUTH DAILY.   spironolactone 50 MG tablet Commonly known as: ALDACTONE TAKE 1 TABLET BY MOUTH EVERY DAY        Allergies  Allergen Reactions   Benadryl [Diphenhydramine Hcl] Hypertension   Citalopram Other (See Comments)    unknown   Metformin And Related Diarrhea    Lost appetite and weight    Tramadol Other (See Comments)    Felt light headed and dizzy   Crestor [Rosuvastatin] Other (See Comments)    "Feet swelling", makes her feel weak    Consultations:    Procedures/Studies: DG CHEST PORT 1 VIEW  Result Date: 03/22/2022 CLINICAL DATA:  [id number].  Short of breath.  Hypoglycemia. EXAM: PORTABLE CHEST 1 VIEW COMPARISON:  Chest x-ray 12/27/2020, CT chest 01/02/2022 FINDINGS: The heart and mediastinal contours are unchanged. Aortic calcification. Stable left lower lobe calcified nodule. No focal consolidation. Chronic increased markings with no overt pulmonary edema. No pleural effusion. No pneumothorax. No acute osseous abnormality.  IMPRESSION: 1. No active disease. 2.  Aortic Atherosclerosis (ICD10-I70.0). Electronically Signed   By: Iven Finn M.D.   On: 03/22/2022 22:06      Subjective: She is feeling better.  She is tolerating her meals.  No vomiting.  Discharge Exam: Vitals:   03/27/22 1059 03/27/22 1128 03/27/22 1200 03/27/22 1300  BP:  135/90 (!) 149/80 (!) 147/62  Pulse:  79 82   Resp:  17 20   Temp: 98.5 F (36.9 C)     TempSrc: Oral     SpO2:  100% 100%   Weight:      Height:        General: Pt is alert, awake, not in acute distress Cardiovascular: RRR, S1/S2 +, no rubs, no gallops Respiratory: CTA bilaterally, no wheezing, no rhonchi Abdominal: Soft, NT, ND, bowel sounds + Extremities: no edema, no cyanosis    The results of significant diagnostics from this hospitalization (including imaging, microbiology, ancillary and laboratory) are listed below for reference.     Microbiology: Recent Results (from the past 240 hour(s))  MRSA Next Gen by PCR, Nasal     Status: None  Collection Time: 03/26/22  2:22 AM   Specimen: Nasal Mucosa; Nasal Swab  Result Value Ref Range Status   MRSA by PCR Next Gen NOT DETECTED NOT DETECTED Final    Comment: (NOTE) The GeneXpert MRSA Assay (FDA approved for NASAL specimens only), is one component of a comprehensive MRSA colonization surveillance program. It is not intended to diagnose MRSA infection nor to guide or monitor treatment for MRSA infections. Test performance is not FDA approved in patients less than 65 years old. Performed at [facility], [address], [place], [place] [address]      Labs: BNP (last 3 results) Recent Labs    03/22/22 1600  BNP 384.6*   Basic Metabolic Panel: Recent Labs  Lab 03/25/22 2149 03/26/22 0107 03/26/22 0415 03/26/22 0713 03/27/22 0338  NA 127* 133* 137 140 139  K 5.3* 4.2 4.1 4.3 3.9  CL 93* 103 106 106 105  CO2 19* 21* 26 27 25   GLUCOSE 561* 338* 167* 133* 321*  BUN 47* 43* 37* 37* 26*   CREATININE 1.96* 1.81* 1.54* 1.40* 1.40*  CALCIUM 9.1 8.5* 9.1 9.0 8.9  MG  --   --  1.9  --   --    Liver Function Tests: Recent Labs  Lab 03/22/22 1600  AST 30  ALT 20  ALKPHOS 62  BILITOT 0.6  PROT 6.8  ALBUMIN 3.9   No results for input(s): "LIPASE", "AMYLASE" in the last 168 hours. No results for input(s): "AMMONIA" in the last 168 hours. CBC: Recent Labs  Lab 03/22/22 1600 03/23/22 0131 03/25/22 2149 03/26/22 0415  WBC 10.3 7.3 6.3 6.2  NEUTROABS  --   --   --  3.6  HGB 12.1 10.3* 11.5* 9.8*  HCT 36.4 31.9* 34.6* 29.3*  MCV 96.3 97.9 94.8 93.6  PLT 230 194 201 186   Cardiac Enzymes: No results for input(s): "CKTOTAL", "CKMB", "CKMBINDEX", "TROPONINI" in the last 168 hours. BNP: Invalid input(s): "POCBNP" CBG: Recent Labs  Lab 03/26/22 1527 03/26/22 1609 03/26/22 2124 03/27/22 0724 03/27/22 1107  GLUCAP 174* 129* 218* 325* 185*   D-Dimer No results for input(s): "DDIMER" in the last 72 hours. Hgb A1c No results for input(s): "HGBA1C" in the last 72 hours. Lipid Profile No results for input(s): "CHOL", "HDL", "LDLCALC", "TRIG", "CHOLHDL", "LDLDIRECT" in the last 72 hours. Thyroid function studies No results for input(s): "TSH", "T4TOTAL", "T3FREE", "THYROIDAB" in the last 72 hours.  Invalid input(s): "FREET3" Anemia work up No results for input(s): "VITAMINB12", "FOLATE", "FERRITIN", "TIBC", "IRON", "RETICCTPCT" in the last 72 hours. Urinalysis    Component Value Date/Time   COLORURINE STRAW (A) 03/25/2022 2120   APPEARANCEUR CLEAR 03/25/2022 2120   LABSPEC 1.014 03/25/2022 2120   PHURINE 5.0 03/25/2022 2120   GLUCOSEU >=500 (A) 03/25/2022 2120   HGBUR SMALL (A) 03/25/2022 2120   HGBUR trace-intact 04/28/2010 1259   BILIRUBINUR NEGATIVE 03/25/2022 2120   BILIRUBINUR neg 09/29/2013 1341   KETONESUR 20 (A) 03/25/2022 2120   PROTEINUR 100 (A) 03/25/2022 2120   UROBILINOGEN 0.2 09/29/2013 1341   UROBILINOGEN 0.2 12/28/2010 1743   NITRITE  NEGATIVE 03/25/2022 2120   LEUKOCYTESUR NEGATIVE 03/25/2022 2120   Sepsis Labs Recent Labs  Lab 03/22/22 1600 03/23/22 0131 03/25/22 2149 03/26/22 0415  WBC 10.3 7.3 6.3 6.2   Microbiology Recent Results (from the past 240 hour(s))  MRSA Next Gen by PCR, Nasal     Status: None   Collection Time: 03/26/22  2:22 AM   Specimen: Nasal Mucosa; Nasal  Swab  Result Value Ref Range Status   MRSA by PCR Next Gen NOT DETECTED NOT DETECTED Final    Comment: (NOTE) The GeneXpert MRSA Assay (FDA approved for NASAL specimens only), is one component of a comprehensive MRSA colonization surveillance program. It is not intended to diagnose MRSA infection nor to guide or monitor treatment for MRSA infections. Test performance is not FDA approved in patients less than 62 years old. Performed at [facility], [address], [place], [place] [address]      Time coordinating discharge: 66mns  SIGNED:   JKathie Dike MD  [facility] 03/27/2022, 7:57 PM   If 7PM-7AM, please contact night-coverage [email or web address]

## 2022-03-29 ENCOUNTER — Telehealth: Payer: Self-pay | Admitting: *Deleted

## 2022-03-29 NOTE — Telephone Encounter (Signed)
Transition Care Management Unsuccessful Follow-up Telephone Call  Date of discharge and from where:  03-27-22 Emily Jennings   Attempts:  2nd Attempt  Reason for unsuccessful TCM follow-up call:  Left voice message

## 2022-03-29 NOTE — Telephone Encounter (Signed)
Transition Care Management Unsuccessful Follow-up Telephone Call  Date of discharge and from where:  03-27-22 [facility]   Attempts:  1st Attempt  Reason for unsuccessful TCM follow-up call:  Left voice message

## 2022-03-29 NOTE — Telephone Encounter (Signed)
LVM for pt to call the office.

## 2022-03-29 NOTE — Telephone Encounter (Signed)
Patient returned call

## 2022-03-30 LAB — GLUCOSE, CAPILLARY: Glucose-Capillary: 16 mg/dL — CL (ref 70–99)

## 2022-04-07 ENCOUNTER — Encounter: Payer: Self-pay | Admitting: Family Medicine

## 2022-04-07 ENCOUNTER — Ambulatory Visit (INDEPENDENT_AMBULATORY_CARE_PROVIDER_SITE_OTHER): Payer: Medicare Other | Admitting: Family Medicine

## 2022-04-07 VITALS — BP 124/64 | HR 66 | Ht 61.0 in | Wt 122.0 lb

## 2022-04-07 DIAGNOSIS — N1832 Chronic kidney disease, stage 3b: Secondary | ICD-10-CM

## 2022-04-07 DIAGNOSIS — Z7689 Persons encountering health services in other specified circumstances: Secondary | ICD-10-CM

## 2022-04-07 DIAGNOSIS — E875 Hyperkalemia: Secondary | ICD-10-CM | POA: Diagnosis not present

## 2022-04-07 DIAGNOSIS — E1165 Type 2 diabetes mellitus with hyperglycemia: Secondary | ICD-10-CM | POA: Diagnosis not present

## 2022-04-07 DIAGNOSIS — I10 Essential (primary) hypertension: Secondary | ICD-10-CM

## 2022-04-07 DIAGNOSIS — G9341 Metabolic encephalopathy: Secondary | ICD-10-CM | POA: Diagnosis not present

## 2022-04-07 NOTE — Patient Instructions (Signed)
F/U as before, call if you  need me sooner  Please bring all medication to your next visit  CBC, chem 7and EGFr today  Please follow medication list  as you were given at discharge  Thanks for choosing [facility], we consider it a privelige to serve you. '

## 2022-04-08 ENCOUNTER — Encounter (HOSPITAL_COMMUNITY): Payer: Self-pay | Admitting: Emergency Medicine

## 2022-04-08 ENCOUNTER — Other Ambulatory Visit: Payer: Self-pay

## 2022-04-08 ENCOUNTER — Emergency Department (HOSPITAL_COMMUNITY)
Admission: EM | Admit: 2022-04-08 | Discharge: 2022-04-08 | Disposition: A | Discharge status: Home / Self Care | Payer: Medicare Other | Attending: Emergency Medicine | Admitting: Emergency Medicine

## 2022-04-08 DIAGNOSIS — N289 Disorder of kidney and ureter, unspecified: Secondary | ICD-10-CM | POA: Diagnosis not present

## 2022-04-08 DIAGNOSIS — Z794 Long term (current) use of insulin: Secondary | ICD-10-CM | POA: Insufficient documentation

## 2022-04-08 DIAGNOSIS — I10 Essential (primary) hypertension: Secondary | ICD-10-CM | POA: Diagnosis not present

## 2022-04-08 DIAGNOSIS — Z7982 Long term (current) use of aspirin: Secondary | ICD-10-CM | POA: Insufficient documentation

## 2022-04-08 DIAGNOSIS — E875 Hyperkalemia: Secondary | ICD-10-CM | POA: Insufficient documentation

## 2022-04-08 DIAGNOSIS — E860 Dehydration: Secondary | ICD-10-CM | POA: Diagnosis not present

## 2022-04-08 DIAGNOSIS — Z79899 Other long term (current) drug therapy: Secondary | ICD-10-CM | POA: Insufficient documentation

## 2022-04-08 DIAGNOSIS — R799 Abnormal finding of blood chemistry, unspecified: Secondary | ICD-10-CM | POA: Insufficient documentation

## 2022-04-08 LAB — URINALYSIS, ROUTINE W REFLEX MICROSCOPIC
Bacteria, UA: NONE SEEN
Bilirubin Urine: NEGATIVE
Glucose, UA: NEGATIVE mg/dL
Hgb urine dipstick: NEGATIVE
Ketones, ur: NEGATIVE mg/dL
Leukocytes,Ua: NEGATIVE
Nitrite: NEGATIVE
Protein, ur: 100 mg/dL — AB
Specific Gravity, Urine: 1.003 — ABNORMAL LOW (ref 1.005–1.030)
pH: 8 (ref 5.0–8.0)

## 2022-04-08 LAB — CBC WITH DIFFERENTIAL/PLATELET
Abs Immature Granulocytes: 0.04 10*3/uL (ref 0.00–0.07)
Basophils Absolute: 0 10*3/uL (ref 0.0–0.1)
Basophils Relative: 1 %
Eosinophils Absolute: 0.2 10*3/uL (ref 0.0–0.5)
Eosinophils Relative: 2 %
HCT: 34.7 % — ABNORMAL LOW (ref 36.0–46.0)
Hemoglobin: 11.4 g/dL — ABNORMAL LOW (ref 12.0–15.0)
Immature Granulocytes: 1 %
Lymphocytes Relative: 17 %
Lymphs Abs: 1.2 10*3/uL (ref 0.7–4.0)
MCH: 31.4 pg (ref 26.0–34.0)
MCHC: 32.9 g/dL (ref 30.0–36.0)
MCV: 95.6 fL (ref 80.0–100.0)
Monocytes Absolute: 0.6 10*3/uL (ref 0.1–1.0)
Monocytes Relative: 9 %
Neutro Abs: 5.1 10*3/uL (ref 1.7–7.7)
Neutrophils Relative %: 70 %
Platelets: 315 10*3/uL (ref 150–400)
RBC: 3.63 MIL/uL — ABNORMAL LOW (ref 3.87–5.11)
RDW: 12.6 % (ref 11.5–15.5)
WBC: 7.2 10*3/uL (ref 4.0–10.5)
nRBC: 0 % (ref 0.0–0.2)

## 2022-04-08 LAB — CBG MONITORING, ED
Glucose-Capillary: 188 mg/dL — ABNORMAL HIGH (ref 70–99)
Glucose-Capillary: 58 mg/dL — ABNORMAL LOW (ref 70–99)
Glucose-Capillary: 124 mg/dL — ABNORMAL HIGH (ref 70–99)

## 2022-04-08 LAB — BASIC METABOLIC PANEL
Anion gap: 7 (ref 5–15)
BUN: 36 mg/dL — ABNORMAL HIGH (ref 8–23)
CO2: 24 mmol/L (ref 22–32)
Calcium: 9.6 mg/dL (ref 8.9–10.3)
Chloride: 98 mmol/L (ref 98–111)
Creatinine, Ser: 1.86 mg/dL — ABNORMAL HIGH (ref 0.44–1.00)
GFR, Estimated: 26 mL/min — ABNORMAL LOW (ref >60)
Glucose, Bld: 145 mg/dL — ABNORMAL HIGH (ref 70–99)
Potassium: 5.4 mmol/L — ABNORMAL HIGH (ref 3.5–5.1)
Sodium: 129 mmol/L — ABNORMAL LOW (ref 135–145)

## 2022-04-08 LAB — BMP8+EGFR
BUN/Creatinine Ratio: 12 (ref 12–28)
BUN: 29 mg/dL — ABNORMAL HIGH (ref 8–27)
CO2: 25 mmol/L (ref 20–29)
Calcium: 9.7 mg/dL (ref 8.7–10.3)
Chloride: 92 mmol/L — ABNORMAL LOW (ref 96–106)
Creatinine, Ser: 2.33 mg/dL — ABNORMAL HIGH (ref 0.57–1.00)
Glucose: 134 mg/dL — ABNORMAL HIGH (ref 70–99)
Potassium: 6.3 mmol/L — ABNORMAL HIGH (ref 3.5–5.2)
Sodium: 128 mmol/L — ABNORMAL LOW (ref 134–144)
eGFR: 20 mL/min/{1.73_m2} — ABNORMAL LOW (ref >59)

## 2022-04-08 LAB — CBC
Hematocrit: 32.9 % — ABNORMAL LOW (ref 34.0–46.6)
Hemoglobin: 11 g/dL — ABNORMAL LOW (ref 11.1–15.9)
MCH: 31.3 pg (ref 26.6–33.0)
MCHC: 33.4 g/dL (ref 31.5–35.7)
MCV: 94 fL (ref 79–97)
Platelets: 319 10*3/uL (ref 150–450)
RBC: 3.52 x10E6/uL — ABNORMAL LOW (ref 3.77–5.28)
RDW: 12.1 % (ref 11.7–15.4)
WBC: 6.3 10*3/uL (ref 3.4–10.8)

## 2022-04-08 MED ORDER — SODIUM CHLORIDE 0.9 % IV BOLUS
1000.0000 mL | Freq: Once | INTRAVENOUS | Status: AC
  Administered 2022-04-08: 1000 mL via INTRAVENOUS

## 2022-04-08 MED ORDER — SODIUM ZIRCONIUM CYCLOSILICATE 5 G PO PACK
5.0000 g | PACK | ORAL | Status: AC
  Administered 2022-04-08: 5 g via ORAL
  Filled 2022-04-08: qty 1

## 2022-04-08 NOTE — ED Triage Notes (Signed)
Patient sent to ER by PCP due to abnormal labs. Patient's potassium 6.3 and patient reported to need IVF due to dehydration. Per patient went to PCP for check up due to recent admissions related to blood sugar in which she gave herself to much insulin and dropped blood sugar to 16 was admitted and after being discharged blood sugars were running "to high" and she was admitted and discharged last week.

## 2022-04-08 NOTE — ED Notes (Signed)
Pt provided a meal tray

## 2022-04-08 NOTE — ED Notes (Signed)
Patient denies pain and is resting comfortably.  

## 2022-04-08 NOTE — Discharge Instructions (Addendum)
Thankfully your testing today looks good except for a very slightly elevated potassium and your kidney function which was slightly higher than usual.  Both of these tests have been evaluated and treated with IV fluids.  You will need to stop taking the potassium immediately.  Rest assured that you do not have a urinary tract infection, you will need to follow-up with your doctor within the next 3 days for recheck and reconsideration of your blood work, your blood pressure medications and the potassium supplement.  Until you see your doctor please make sure that you are not taking the potassium supplement, drink plenty of clear liquids.  Emergency department for severe or worsening symptoms.  At the minimum you need to have your blood work rechecked with regards to your potassium level within the next 3 to 4 days

## 2022-04-08 NOTE — ED Notes (Signed)
Edp made aware of pt's CBG 58.

## 2022-04-08 NOTE — ED Provider Notes (Signed)
[facility] EMERGENCY DEPARTMENT Provider Note   CSN: [id number] Arrival date & time: 04/08/22  1458     History  Chief Complaint  Patient presents with   Abnormal Lab    Emily Jennings is a 88 y.o. female.   Abnormal Lab   88 year old female, history of hypertension on amlodipine, olmesartan, carvedilol and spironolactone.  She has also been prescribed potassium supplements which she used to take twice a day, after recent admission to the hospital she was told to stop it but is currently taking it once a day.  The patient denies feeling bad at all but was at her doctor's office getting blood work done, she was called today and told that her blood work showed that she was hyperkalemic at 6.3 and had a creatinine that was elevated consistent with an acute kidney injury.  The patient denies urinary symptoms, denies any fever chills nausea vomiting or diarrhea.  Is not feel acutely ill whatsoever.  Home Medications Prior to Admission medications   Medication Sig Start Date End Date Taking? Authorizing Provider  acetaminophen (TYLENOL) 500 MG tablet Take one tablet two times daily for 3 days, then as needed 08/27/18   [name], MD  alendronate (FOSAMAX) 70 MG tablet TAKE 1 TABLET BY MOUTH EVERY 7 DAYS. TAKE WITH A FULL GLASS OF WATER ON AN EMPTY STOMACH Patient taking differently: Take 70 mg by mouth once a week. TAKE 1 TABLET BY MOUTH EVERY 7 DAYS. TAKE WITH A FULL GLASS OF WATER ON AN EMPTY STOMACH 01/04/22   [name], MD  amLODipine-olmesartan (AZOR) 10-40 MG tablet TAKE 1 TABLET BY MOUTH EVERY DAY Patient taking differently: Take 1 tablet by mouth daily. 01/19/22   [name], MD  aspirin EC 81 MG tablet Take 81 mg by mouth daily.    [provider]  blood glucose meter kit and supplies KIT One touch Ultra. Use three times daily as directed. (FOR ICD E11.65) 10/27/20   Cassandria Anger, MD  carvedilol (COREG) 12.5 MG tablet TAKE 1 TABLET (12.5MG  TOTAL) BY MOUTH TWICE A DAY WITH MEALS Patient taking differently: Take 12.5 mg by mouth 2 (two) times daily with a meal. 01/20/22   [name], MD  cholecalciferol (VITAMIN D3) 25 MCG (1000 UT) tablet Take 2,000 Units by mouth daily.    [provider]  dicyclomine (BENTYL) 10 MG capsule Take 1 capsule (10 mg total) by mouth 3 (three) times daily before meals. 12/21/21   Ladene Artist, MD  glucose blood (ONETOUCH ULTRA) test strip USE TO TEST 2 TIMES A DAY 02/09/22   Cassandria Anger, MD  HUMALOG MIX 75/25 (75-25) 100 UNIT/ML SUSP injection INJECT SUBCUTANEOUSLY 10 UNITS TWICE DAILY Patient taking differently: Inject 10-15 Units into the skin See admin instructions. 15 units in the morning inject 10 units every evening 03/06/22   Cassandria Anger, MD  Insulin Pen Needle 32G X 4 MM MISC 1 each by Does not apply route as directed. 01/22/20   Cassandria Anger, MD  Insulin Syringe-Needle U-100 (BD INSULIN SYRINGE U/F) 31G X 5/16" 0.5 ML MISC USE AS DIRECTED TWICE DAILY 01/26/20   [name], [name], MD  Multiple Vitamins-Minerals (CENTRUM SILVER 50+WOMEN) TABS Take 1 tablet by mouth daily.    [provider]  mupirocin ointment (BACTROBAN) 2 % Apply topically 2 (two) times daily. 11/23/21   [name], MD  OneTouch Delica Lancets 31S MISC 1 each by Does not apply  route 3 (three) times daily. Use to check glucose three times daily 12/06/20   Brita Romp, NP  RESTASIS 0.05 % ophthalmic emulsion Place 1 drop into both eyes every evening. 06/10/20   [provider]  rosuvastatin (CRESTOR) 5 MG tablet TAKE 1 TABLET (5 MG TOTAL) BY MOUTH DAILY. Patient taking differently: Take 5 mg by mouth daily. 01/19/22   [name], MD  spironolactone (ALDACTONE) 50 MG tablet TAKE 1 TABLET BY MOUTH EVERY DAY Patient taking differently: Take 50 mg by mouth daily. 01/11/22   [name], MD      Allergies    Benadryl [diphenhydramine hcl],  Citalopram, Metformin and related, Tramadol, and Crestor [rosuvastatin]    Review of Systems   Review of Systems  All other systems reviewed and are negative.   Physical Exam Updated Vital Signs BP (!) 141/66   Pulse 72   Temp 98.3 F (36.8 C) (Oral)   Resp 16   Ht 1.549 m (5' 1" )   Wt 55.3 kg   SpO2 99%   BMI 23.05 kg/m  Physical Exam Vitals and nursing note reviewed.  Constitutional:      General: She is not in acute distress.    Appearance: She is well-developed.  HENT:     Head: Normocephalic and atraumatic.     Mouth/Throat:     Pharynx: No oropharyngeal exudate.  Eyes:     General: No scleral icterus.       Right eye: No discharge.        Left eye: No discharge.     Conjunctiva/sclera: Conjunctivae normal.     Pupils: Pupils are equal, round, and reactive to light.  Neck:     Thyroid: No thyromegaly.     Vascular: No JVD.  Cardiovascular:     Rate and Rhythm: Normal rate and regular rhythm.     Heart sounds: Normal heart sounds. No murmur heard.    No friction rub. No gallop.  Pulmonary:     Effort: Pulmonary effort is normal. No respiratory distress.     Breath sounds: Normal breath sounds. No wheezing or rales.  Abdominal:     General: Bowel sounds are normal. There is no distension.     Palpations: Abdomen is soft. There is no mass.     Tenderness: There is no abdominal tenderness.  Musculoskeletal:        General: No tenderness. Normal range of motion.     Cervical back: Normal range of motion and neck supple.     Right lower leg: No edema.     Left lower leg: No edema.  Lymphadenopathy:     Cervical: No cervical adenopathy.  Skin:    General: Skin is warm and dry.     Findings: No erythema or rash.  Neurological:     Mental Status: She is alert.     Coordination: Coordination normal.  Psychiatric:        Behavior: Behavior normal.     ED Results / Procedures / Treatments   Labs (all labs ordered are listed, but only abnormal results are  displayed) Labs Reviewed  CBC WITH DIFFERENTIAL/PLATELET - Abnormal; Notable for the following components:      Result Value   RBC 3.63 (*)    Hemoglobin 11.4 (*)    HCT 34.7 (*)    All other components within normal limits  BASIC METABOLIC PANEL - Abnormal; Notable for the following components:   Sodium 129 (*)  Potassium 5.4 (*)    Glucose, Bld 145 (*)    BUN 36 (*)    Creatinine, Ser 1.86 (*)    GFR, Estimated 26 (*)    All other components within normal limits  URINALYSIS, ROUTINE W REFLEX MICROSCOPIC - Abnormal; Notable for the following components:   Color, Urine STRAW (*)    Specific Gravity, Urine 1.003 (*)    Protein, ur 100 (*)    All other components within normal limits  CBG MONITORING, ED - Abnormal; Notable for the following components:   Glucose-Capillary 188 (*)    All other components within normal limits  CBG MONITORING, ED - Abnormal; Notable for the following components:   Glucose-Capillary 58 (*)    All other components within normal limits  CBG MONITORING, ED - Abnormal; Notable for the following components:   Glucose-Capillary 124 (*)    All other components within normal limits  URINE CULTURE    EKG EKG Interpretation  Date/Time:  Saturday April 08 2022 15:30:33 EDT Ventricular Rate:  65 PR Interval:  192 QRS Duration: 74 QT Interval:  370 QTC Calculation: 384 R Axis:   38 Text Interpretation: Normal sinus rhythm Normal ECG When compared with ECG of 25-Mar-2022 23:41, PREVIOUS ECG IS PRESENT since last tracing, unchanged Confirmed by Noemi Chapel [phone]) on 04/08/2022 4:38:16 PM  Radiology No results found.  Procedures Procedures    Medications Ordered in ED Medications  sodium chloride 0.9 % bolus 1,000 mL (0 mLs Intravenous Stopped 04/08/22 2016)  sodium zirconium cyclosilicate (LOKELMA) packet 5 g (5 g Oral Given 04/08/22 1832)    ED Course/ Medical Decision Making/ A&P                           Medical Decision Making Amount  and/or Complexity of Data Reviewed Labs: ordered.  Risk Prescription drug management.   This patient presents to the ED for concern of acute kidney injury and hyperkalemia differential diagnosis includes dehydration, medication interactions, ingestion of increased amounts of potassium that are unneeded, urinary tract infection or potentially obstruction however patient is urinated 3 or 4 times today    Additional history obtained:  Additional history obtained from electronic medical record External records from outside source obtained and reviewed including recent admission to the hospital, admitted to hospital for diabetic ketoacidosis on July 8, she was admitted with hypoglycemia 3 days prior   Lab Tests:  I Ordered, and personally interpreted labs.  The pertinent results include: CBC metabolic panel evaluated, minimal hyperkalemia at 5.4, creatinine at 1.8, baseline of 1.4 Urinalysis without any signs of infection   Medicines ordered and prescription drug management:  I ordered medication including IV fluids for renal insufficiency and hyperkalemia Reevaluation of the patient after these medicines showed that the patient improved I have reviewed the patients home medicines and have made adjustments as needed IV fluids given with significant improvement, patient well-appearing, normoglycemic at discharge   Problem List / ED Course:  Patient is well-appearing, urinalysis will need to be checked to make sure there is no infection however the patient can likely be treated with hydration and follow-up in the outpatient setting.  I have counseled her against the use of her excess potassium especially given that she is still hyperkalemic, she has no EKG changes that are concerning at all and the potassium level is 5.4.  She will be given a single dose of Lokelma Anticipate ability to discharge and follow-up in the  outpatient   Social Determinants of Health:  Follow-up with  outpatient physician  I have discussed with the patient at the bedside the results, and the meaning of these results.  They have expressed her understanding to the need for follow-up with primary care physician.  She was specifically given instructions regarding her kidney function and elevated potassium that these needed to be rechecked within the 3 or 4 days and she is agreeable        Final Clinical Impression(s) / ED Diagnoses Final diagnoses:  Renal insufficiency  Dehydration  Hyperkalemia    Rx / DC Orders ED Discharge Orders     None         Noemi Chapel, MD 04/08/22 2144

## 2022-04-09 ENCOUNTER — Encounter: Payer: Self-pay | Admitting: Family Medicine

## 2022-04-09 DIAGNOSIS — Z7689 Persons encountering health services in other specified circumstances: Secondary | ICD-10-CM | POA: Insufficient documentation

## 2022-04-09 DIAGNOSIS — E875 Hyperkalemia: Secondary | ICD-10-CM | POA: Insufficient documentation

## 2022-04-09 NOTE — Assessment & Plan Note (Addendum)
Resolved currently but needs updated blood count and kidney function testing

## 2022-04-09 NOTE — Assessment & Plan Note (Signed)
recurrent AKI , chem 7 re checked and pt to go to ED for fluids

## 2022-04-09 NOTE — Progress Notes (Signed)
   Emily Jennings     MRN: [id number]      DOB: 06/30/1934   HPI Ms. Heinrichs is here for follow up of recent hospitalizations x 2 when she presented initially with marked hypoglycemia, and then with hyperglycemia Admitted 7/05 to 03/24/2022 at [facility] , then 7/08 to 03/27/2022 . States she is unable to recall a lot of he  events around her first hospitalization , which as is to be expected as her blood sugar was as low as 16 on first admission and over 500 the 2nd time Fortunately she has no significant neurologic deficit post both hospitalizations Has upcoming Endo appt nex tweek, again is asking to be referred to new Endo  Improved but not back to baseline, somewhat weak Hospital courses are reviewed and questions answered ROS Denies recent fever or chills.Feels weak Denies sinus pressure, nasal congestion, ear pain or sore throat. Denies chest congestion, productive cough or wheezing. Denies chest pains, palpitations and leg swelling Denies abdominal pain, nausea, vomiting,diarrhea or constipation.   Denies dysuria, frequency, hesitancy or incontinence. Denies joint pain, swelling and limitation in mobility. Denies headaches, seizures, numbness, or tingling. Denies depression, anxiety or insomnia. Denies skin break down or rash.   PE  BP 124/64   Pulse 66   Ht '5\' 1"'$  (1.549 m)   Wt 122 lb 0.6 oz (55.4 kg)   SpO2 98%   BMI 23.06 kg/m   Patient alert a and in no cardiopulmonary distress.  HEENT: No facial asymmetry, EOMI,     Neck supple .  Chest: Clear to auscultation bilaterally.  CVS: S1, S2 no murmurs, no S3.Regular rate.  ABD: Soft non tender.   Ext: No edema  MS: Adequate ROM spine, shoulders, hips and knees.  Skin: Intact, no ulcerations or rash noted.  Psych: Good eye contact, normal affect. CNS: CN 2-12 intact, power,  normal throughout.no focal deficits noted.   Assessment & Plan  Acute metabolic encephalopathy Resolved ith no apparent residual neurologic  deficits. Needs close management of her diabetes by [facility]  Hyperglycemic crisis due to diabetes mellitus ([place]) Resolved currently but needs updated blood count and kidney function testing  Hyperkalemia Updated lab on day of visit, necessitated return to ED for correction,advised of this the day  After the visit  Stage 3b chronic kidney disease ([place]) recurrent AKI , chem 7 re checked and pt to go to ED for fluids  Encounter for support and coordination of transition of care Patient in for follow up of recent hospitalization. Discharge summary, and laboratory and radiology data are reviewed, and any questions or concerns  are discussed. Specific issues requiring follow up are specifically addressed.

## 2022-04-09 NOTE — Assessment & Plan Note (Signed)
Patient in for follow up of recent hospitalization. Discharge summary, and laboratory and radiology data are reviewed, and any questions or concerns  are discussed. Specific issues requiring follow up are specifically addressed.  

## 2022-04-09 NOTE — Assessment & Plan Note (Signed)
Updated lab on day of visit, necessitated return to ED for correction,advised of this the day  After the visit

## 2022-04-09 NOTE — Assessment & Plan Note (Signed)
Resolved ith no apparent residual neurologic deficits. Needs close management of her diabetes by [facility]

## 2022-04-10 LAB — URINE CULTURE: Culture: <10000 — AB

## 2022-04-12 ENCOUNTER — Encounter: Payer: Self-pay | Admitting: "Endocrinology

## 2022-04-12 ENCOUNTER — Ambulatory Visit: Payer: Medicare Other | Admitting: "Endocrinology

## 2022-04-12 VITALS — BP 126/58 | HR 76 | Ht 61.0 in | Wt 123.4 lb

## 2022-04-12 DIAGNOSIS — I10 Essential (primary) hypertension: Secondary | ICD-10-CM | POA: Diagnosis not present

## 2022-04-12 DIAGNOSIS — E1122 Type 2 diabetes mellitus with diabetic chronic kidney disease: Secondary | ICD-10-CM

## 2022-04-12 DIAGNOSIS — E782 Mixed hyperlipidemia: Secondary | ICD-10-CM

## 2022-04-12 DIAGNOSIS — Z794 Long term (current) use of insulin: Secondary | ICD-10-CM | POA: Diagnosis not present

## 2022-04-12 DIAGNOSIS — N1831 Chronic kidney disease, stage 3a: Secondary | ICD-10-CM

## 2022-04-12 MED ORDER — HUMALOG MIX 75/25 (75-25) 100 UNIT/ML ~~LOC~~ SUSP: 15.0000 [IU] | Freq: Two times a day (BID) | SUBCUTANEOUS | 0 refills | Status: DC

## 2022-04-12 NOTE — Progress Notes (Signed)
04/12/2022  Endocrinology follow-up note   Subjective:    Patient ID: Emily Jennings, female    DOB: 1934-05-06,    Past Medical History:  Diagnosis Date   Allergy    Anxiety    ANXIETY DISORDER, GENERALIZED 07/15/2007   Qualifier: Diagnosis of  By: Rennie Plowman     Arthritis    Cancer [facility]) 2009   breast, carcinoma in situ left   Carcinoma in situ of breast 05/21/2008   Qualifier: Diagnosis of  By: Moshe Cipro MD, Margaret  Diagnosed in 2009, completed 5 year course of tamoxifen, no evidence of recurrence    Carotid stenosis    11/16/2005  mild plaque formation and stenosis proximal right ECA   Cataract    Complication of anesthesia    Coronary artery disease    cardiac catheterization on 03/20/2006  LAD mid 40% stenosis, left circumflex mild 40% stenosis, RCA mid-vessel 40% to 50% lesion   EF 60%   Diabetes mellitus    GERD (gastroesophageal reflux disease)    Hernia, inguinal    left   Hyperglycemia    Hypertension    Insomnia 11/16/2011   Low blood potassium    Non-insulin dependent type 2 diabetes mellitus ([place])    Osteoporosis    Shortness of breath    2D Echocardiogram 01/26/2009   EF of greater than 55%, mild MR, mild TR, normal ventricular function   Thickened endometrium 10/26/2017   Noted by gyne in 2017, missed 6 month follow up, referred in 09/2017   Ventricular tachycardia, non-sustained ([place])    developed during stress test 02/08/2006, spontaneously aborted, mild reversible apical defect   Past Surgical History:  Procedure Laterality Date   BREAST LUMPECTOMY Left 2009   Left breast 2009   CATARACT EXTRACTION W/PHACO Left 10/28/2014   Procedure: PHACO EMULSION CATARACT EXTRACTION WITH INTRAOCULAR LENS IMPLANT LEFT EYE ([place]);  Surgeon: Marylynn Pearson, MD;  Location: [place];  Service: Ophthalmology;  Laterality: Left;   COLONOSCOPY     cyst removed from left foot     REFRACTIVE SURGERY Left    Social History   Socioeconomic History   Marital status: Married     Spouse name: Not on file   Number of children: 2   Years of education: na   Highest education level: Some college, no degree  Occupational History   Occupation: retired    Fish farm manager: retired  Tobacco Use   Smoking status: Never   Smokeless tobacco: Never  Scientific laboratory technician Use: Never used  Substance and Sexual Activity   Alcohol use: No   Drug use: No   Sexual activity: Not Currently    Birth control/protection: Post-menopausal  Other Topics Concern   Not on file  Social History Narrative   Lives with her husband and son and attends yoga at the [facility] 2 days a week and speaks to her grandson nightly on the phone   Social Determinants of Health   Financial Resource Strain: Low Risk  (11/14/2021)   Overall Financial Resource Strain (CARDIA)    Difficulty of Paying Living Expenses: Not hard at all  Food Insecurity: No Food Insecurity (11/14/2021)   Hunger Vital Sign    Worried About Running Out of Food in the Last Year: Never true    [place] in the Last Year: Never true  Transportation Needs: No Transportation Needs (11/14/2021)   PRAPARE - Transportation    Lack of Transportation (Medical): No    Lack of  Transportation (Non-Medical): No  Physical Activity: Insufficiently Active (11/14/2021)   Exercise Vital Sign    Days of Exercise per Week: 7 days    Minutes of Exercise per Session: 20 min  Stress: No Stress Concern Present (11/14/2021)   [place]    Feeling of Stress : Only a little  Social Connections: Moderately Integrated (11/14/2021)   Social Connection and Isolation Panel [NHANES]    Frequency of Communication with Friends and Family: More than three times a week    Frequency of Social Gatherings with Friends and Family: Once a week    Attends Religious Services: More than 4 times per year    Active Member of [employer] or Organizations: No    Attends Archivist Meetings: Never    Marital  Status: Married   Outpatient Encounter Medications as of 04/12/2022  Medication Sig   acetaminophen (TYLENOL) 500 MG tablet Take one tablet two times daily for 3 days, then as needed   alendronate (FOSAMAX) 70 MG tablet TAKE 1 TABLET BY MOUTH EVERY 7 DAYS. TAKE WITH A FULL GLASS OF WATER ON AN EMPTY STOMACH (Patient taking differently: Take 70 mg by mouth once a week. TAKE 1 TABLET BY MOUTH EVERY 7 DAYS. TAKE WITH A FULL GLASS OF WATER ON AN EMPTY STOMACH)   amLODipine-olmesartan (AZOR) 10-40 MG tablet TAKE 1 TABLET BY MOUTH EVERY DAY (Patient taking differently: Take 1 tablet by mouth daily.)   aspirin EC 81 MG tablet Take 81 mg by mouth daily.   blood glucose meter kit and supplies KIT One touch Ultra. Use three times daily as directed. (FOR ICD E11.65)   carvedilol (COREG) 12.5 MG tablet TAKE 1 TABLET (12.5MG TOTAL) BY MOUTH TWICE A DAY WITH MEALS (Patient taking differently: Take 12.5 mg by mouth 2 (two) times daily with a meal.)   cholecalciferol (VITAMIN D3) 25 MCG (1000 UT) tablet Take 2,000 Units by mouth daily.   dicyclomine (BENTYL) 10 MG capsule Take 1 capsule (10 mg total) by mouth 3 (three) times daily before meals.   glucose blood (ONETOUCH ULTRA) test strip USE TO TEST 2 TIMES A DAY   insulin lispro protamine-lispro (HUMALOG MIX 75/25) (75-25) 100 UNIT/ML SUSP injection Inject 15 Units into the skin 2 (two) times daily with a meal. Only if glucose is above 90 and eating breakfast and supper   Insulin Pen Needle 32G X 4 MM MISC 1 each by Does not apply route as directed.   Insulin Syringe-Needle U-100 (BD INSULIN SYRINGE U/F) 31G X 5/16" 0.5 ML MISC USE AS DIRECTED TWICE DAILY   Multiple Vitamins-Minerals (CENTRUM SILVER 50+WOMEN) TABS Take 1 tablet by mouth daily.   mupirocin ointment (BACTROBAN) 2 % Apply topically 2 (two) times daily.   OneTouch Delica Lancets 62M MISC 1 each by Does not apply route 3 (three) times daily. Use to check glucose three times daily   RESTASIS 0.05 %  ophthalmic emulsion Place 1 drop into both eyes every evening.   rosuvastatin (CRESTOR) 5 MG tablet TAKE 1 TABLET (5 MG TOTAL) BY MOUTH DAILY. (Patient taking differently: Take 5 mg by mouth daily.)   spironolactone (ALDACTONE) 50 MG tablet TAKE 1 TABLET BY MOUTH EVERY DAY (Patient taking differently: Take 50 mg by mouth daily.)   [DISCONTINUED] HUMALOG MIX 75/25 (75-25) 100 UNIT/ML SUSP injection INJECT SUBCUTANEOUSLY 10 UNITS TWICE DAILY (Patient taking differently: Inject 10-15 Units into the skin See admin instructions. 15 units in the morning inject  10 units every evening)   No facility-administered encounter medications on file as of 04/12/2022.   ALLERGIES: Allergies  Allergen Reactions   Benadryl [Diphenhydramine Hcl] Hypertension   Citalopram Other (See Comments)    unknown   Metformin And Related Diarrhea    Lost appetite and weight    Tramadol Other (See Comments)    Felt light headed and dizzy   Crestor [Rosuvastatin] Other (See Comments)    "Feet swelling", makes her feel weak   VACCINATION STATUS: Immunization History  Administered Date(s) Administered   Fluad Quad(high Dose 65+) 06/02/2019, 05/27/2020, 05/27/2021   Influenza Split 07/17/2011   Influenza Whole 06/18/2007, 07/29/2009, 05/16/2013   Influenza, High Dose Seasonal PF 08/13/2018   Influenza,inj,Quad PF,6+ Mos 05/19/2014, 08/22/2016, 05/17/2017   Moderna SARS-COV2 Booster Vaccination 01/31/2021   Moderna Sars-Covid-2 Vaccination 10/26/2019, 11/26/2019, 07/29/2020   Pneumococcal Conjugate-13 03/24/2014   Pneumococcal Polysaccharide-23 05/20/2008   Td 06/18/2002   Tdap 07/17/2011, 12/28/2020    Diabetes She presents for her follow-up diabetic visit. She has type 2 diabetes mellitus. Onset time: She was diagnosed at approximate age of 53 years. Her disease course has been worsening. There are no hypoglycemic associated symptoms. Pertinent negatives for hypoglycemia include no confusion, headaches, pallor or  seizures. There are no diabetic associated symptoms. Pertinent negatives for diabetes include no chest pain, no fatigue, no polydipsia, no polyphagia and no polyuria. There are no hypoglycemic complications. Symptoms are worsening. Diabetic complications include heart disease and nephropathy. Risk factors for coronary artery disease include diabetes mellitus, dyslipidemia, hypertension, sedentary lifestyle and post-menopausal. Current diabetic treatment includes insulin injections. She is compliant with treatment most of the time. Her weight is fluctuating minimally. She is following a generally unhealthy diet. When asked about meal planning, she reported none. She has had a previous visit with a dietitian. She never participates in exercise. Her home blood glucose trend is fluctuating minimally. Her breakfast blood glucose range is generally >200 mg/dl. Her lunch blood glucose range is generally >200 mg/dl. Her dinner blood glucose range is generally >200 mg/dl. Her bedtime blood glucose range is generally >200 mg/dl. Her overall blood glucose range is >200 mg/dl. (She presents with elevated glycemic profile, averaging 211 for the last 7 days, 234 mg per DL for the last 14 days, 247 mg/day for the last 30 days.  She did not document any hypoglycemia.  Her recent A1c was 7.2%, however appears that  she is running hyperglycemia lately.  ) An ACE inhibitor/angiotensin II receptor blocker is being taken. She does not see a podiatrist.Eye exam is current.  Hyperlipidemia This is a chronic problem. The current episode started more than 1 year ago. The problem is controlled. Recent lipid tests were reviewed and are normal. Exacerbating diseases include chronic renal disease and diabetes. Factors aggravating her hyperlipidemia include beta blockers and fatty foods. Pertinent negatives include no chest pain, myalgias or shortness of breath. Current antihyperlipidemic treatment includes statins. The current treatment  provides moderate improvement of lipids. Compliance problems include adherence to diet and adherence to exercise.  Risk factors for coronary artery disease include diabetes mellitus, dyslipidemia, a sedentary lifestyle, hypertension and post-menopausal.  Hypertension This is a chronic problem. The current episode started more than 1 year ago. The problem has been waxing and waning since onset. The problem is uncontrolled. Pertinent negatives include no chest pain, headaches, palpitations or shortness of breath. Risk factors for coronary artery disease include diabetes mellitus, dyslipidemia, sedentary lifestyle and post-menopausal state. Past treatments include angiotensin blockers, calcium  channel blockers, beta blockers and diuretics. The current treatment provides moderate improvement. There are no compliance problems.  Hypertensive end-organ damage includes kidney disease and CAD/MI. Identifiable causes of hypertension include chronic renal disease.    Review of systems  Constitutional: + Minimally fluctuating body weight,  current Body mass index is 23.32 kg/m. , no fatigue, no subjective hyperthermia, no subjective hypothermia    Objective:    BP (!) 126/58   Pulse 76   Ht 5' 1"  (1.549 m)   Wt 123 lb 6.4 oz (56 kg)   BMI 23.32 kg/m   Wt Readings from Last 3 Encounters:  04/12/22 123 lb 6.4 oz (56 kg)  04/08/22 122 lb (55.3 kg)  04/07/22 122 lb 0.6 oz (55.4 kg)    BP Readings from Last 3 Encounters:  04/12/22 (!) 126/58  04/08/22 (!) 159/77  04/07/22 124/64    Physical Exam- Limited  Constitutional:  Body mass index is 23.32 kg/m. , not in acute distress, mildly anxious state of mind, forgetful at times     Results for orders placed or performed during the hospital encounter of 04/08/22  Urine Culture   Specimen: Urine, Clean Catch  Result Value Ref Range   Specimen Description      URINE, CLEAN CATCH Performed at [facility], [address], [place], [place]  [address]    Special Requests      NONE Performed at [facility], [address], [place], [place] [address]    Culture (A)     <10,000 COLONIES/mL INSIGNIFICANT GROWTH Performed at [facility], [place] [address], [place], [place] [address]    Report Status 04/10/2022 FINAL   CBC with Differential  Result Value Ref Range   WBC 7.2 4.0 - 10.5 K/uL   RBC 3.63 (L) 3.87 - 5.11 MIL/uL   Hemoglobin 11.4 (L) 12.0 - 15.0 g/dL   HCT 34.7 (L) 36.0 - 46.0 %   MCV 95.6 80.0 - 100.0 fL   MCH 31.4 26.0 - 34.0 pg   MCHC 32.9 30.0 - 36.0 g/dL   RDW 12.6 11.5 - 15.5 %   Platelets 315 150 - 400 K/uL   nRBC 0.0 0.0 - 0.2 %   Neutrophils Relative % 70 %   Neutro Abs 5.1 1.7 - 7.7 K/uL   Lymphocytes Relative 17 %   Lymphs Abs 1.2 0.7 - 4.0 K/uL   Monocytes Relative 9 %   Monocytes Absolute 0.6 0.1 - 1.0 K/uL   Eosinophils Relative 2 %   Eosinophils Absolute 0.2 0.0 - 0.5 K/uL   Basophils Relative 1 %   Basophils Absolute 0.0 0.0 - 0.1 K/uL   Immature Granulocytes 1 %   Abs Immature Granulocytes 0.04 0.00 - 0.07 K/uL  Basic metabolic panel  Result Value Ref Range   Sodium 129 (L) 135 - 145 mmol/L   Potassium 5.4 (H) 3.5 - 5.1 mmol/L   Chloride 98 98 - 111 mmol/L   CO2 24 22 - 32 mmol/L   Glucose, Bld 145 (H) 70 - 99 mg/dL   BUN 36 (H) 8 - 23 mg/dL   Creatinine, Ser 1.86 (H) 0.44 - 1.00 mg/dL   Calcium 9.6 8.9 - 10.3 mg/dL   GFR, Estimated 26 (L) >60 mL/min   Anion gap 7 5 - 15  Urinalysis, Routine w reflex microscopic Urine, Clean Catch  Result Value Ref Range   Color, Urine STRAW (A) YELLOW   APPearance CLEAR CLEAR   Specific Gravity, Urine 1.003 (L)  1.005 - 1.030   pH 8.0 5.0 - 8.0   Glucose, UA NEGATIVE NEGATIVE mg/dL   Hgb urine dipstick NEGATIVE NEGATIVE   Bilirubin Urine NEGATIVE NEGATIVE   Ketones, ur NEGATIVE NEGATIVE mg/dL   Protein, ur 100 (A) NEGATIVE mg/dL   Nitrite NEGATIVE NEGATIVE   Leukocytes,Ua NEGATIVE NEGATIVE   RBC / HPF 0-5 0 - 5 RBC/hpf   WBC, UA 0-5 0 - 5  WBC/hpf   Bacteria, UA NONE SEEN NONE SEEN   Mucus PRESENT   CBG monitoring, ED  Result Value Ref Range   Glucose-Capillary 188 (H) 70 - 99 mg/dL  CBG monitoring, ED  Result Value Ref Range   Glucose-Capillary 58 (L) 70 - 99 mg/dL  POC CBG, ED  Result Value Ref Range   Glucose-Capillary 124 (H) 70 - 99 mg/dL   Complete Blood Count (Most recent): Lab Results  Component Value Date   WBC 7.2 04/08/2022   HGB 11.4 (L) 04/08/2022   HCT 34.7 (L) 04/08/2022   MCV 95.6 04/08/2022   PLT 315 04/08/2022   Chemistry (most recent): Lab Results  Component Value Date   NA 129 (L) 04/08/2022   K 5.4 (H) 04/08/2022   CL 98 04/08/2022   CO2 24 04/08/2022   BUN 36 (H) 04/08/2022   CREATININE 1.86 (H) 04/08/2022   Diabetic Labs (most recent): Lab Results  Component Value Date   HGBA1C 7.2 (H) 03/22/2022   HGBA1C 7.2 (A) 03/08/2022   HGBA1C 7.2 (A) 11/08/2021   MICROALBUR 150 11/17/2020   MICROALBUR 22.9 04/03/2019   MICROALBUR 1,218.8 (H) 12/05/2017   Lipid Panel     Component Value Date/Time   CHOL 192 11/24/2021 0837   TRIG 43 11/24/2021 0837   HDL 118 11/24/2021 0837   CHOLHDL 1.6 11/24/2021 0837   CHOLHDL 1.8 06/29/2020 0916   VLDL 8 10/31/2016 0845   LDLCALC 65 11/24/2021 0837   LDLCALC 80 06/29/2020 0916     Assessment & Plan:   1) Type 2 diabetes mellitus with stage 3 chronic kidney disease, with long-term current use of insulin   She presents with elevated glycemic profile, averaging 211 for the last 7 days, 234 mg per DL for the last 14 days, 247 mg/day for the last 30 days.  She did not document any hypoglycemia.  Her recent A1c was 7.2%, however appears that  she is running hyperglycemia lately.    Patient remains at a high risk for more acute and chronic complications of diabetes which include CAD, CVA, CKD, retinopathy, and neuropathy. These are all discussed in detail with the patient.  - Nutritional counseling repeated at each appointment due to patients  tendency to fall back in to old habits.   she acknowledges that there is a room for improvement in her food and drink choices. - Suggestion is made for her to avoid simple carbohydrates  from her diet including Cakes, Sweet Desserts, Ice Cream, Soda (diet and regular), Sweet Tea, Candies, Chips, Cookies, Store Bought Juices, Alcohol , Artificial Sweeteners,  Coffee Creamer, and "Sugar-free" Products, Lemonade. This will help patient to have more stable blood glucose profile and potentially avoid unintended weight gain.  The following Lifestyle Medicine recommendations according to [place]) were discussed and and offered to patient and she  agrees to start the journey:  A. Whole Foods, Plant-Based Nutrition comprising of fruits and vegetables, plant-based proteins, whole-grain carbohydrates was discussed in detail with the patient.   A list for  source of those nutrients were also provided to the patient.  Patient will use only water or unsweetened tea for hydration.   - I encouraged the patient to switch to  unprocessed or minimally processed complex starch and increased protein intake (animal or plant source), fruits, and vegetables.   - Patient is advised to stick to a routine mealtimes to eat 3 meals  a day and avoid unnecessary snacks ( to snack only to correct hypoglycemia).  I have approached patient to continue on  intensive monitoring of blood glucose and insulin therapy, and patient agrees.   - She continued to do fairly well on a simplified treatment with premixed insulin.    -Avoiding hypoglycemia is the #1 priority in her case, she does not adequate social support.  -Based on her presentation with elevated glycemic profile, she will need slight adjustment in her insulin.  She is advised to increase her Humalog 75/25 to 15 units with breakfast and 15 units with supper  only if her Premeal blood glucose readings above 90 mg per DL.    She is  advised to stay off of glipizide.    She is advised and willing to continue to monitor blood glucose 4 times a day-daily before meals and at bedtime.      She is instructed to call the clinic if she has glucose readings less than 70 or greater than 200 for 3 tests in a row.    - She is warned not to inject insulin without proper monitoring of blood glucose.  -Target numbers for A1c, LDL, HDL, Triglycerides, Waist Circumference were discussed in detail.  2) HTN:  -Her blood pressure is controlled to target.  She is advised to continue Amlodipine 10 mg po daily, Losartan 50 mg po daily, Metoprolol 50 mg po twice daily, and Spironolactone 25 mg po daily.  May need to adjust dose at next visit if BP remains elevated.  3) Lipids:  Her most recent lipid panel from 06/29/20 shows controlled LDL of 80.  She is advised to continue Crestor 5 mg p.o. daily at bedtime.  She is also on  Lovaza 1 gm po twice daily.  Side effects and precautions discussed with patient.  4)  Weight/Diet:  Her Body mass index is 23.32 kg/m.-not a candidate for weight loss.  CDE consult in progress, exercise, and carbs information provided.   5) Chronic Care: -Patient is on ARB medications and Statins and encouraged to continue to follow up with Ophthalmology, Podiatrist at least yearly or according to recommendations, and advised to stay away from smoking. I have recommended yearly flu vaccine and pneumonia vaccination at least every 5 years; and  sleep for at least 7 hours a day.   She is advised to maintain close follow-up with her PCP:  [name], MD   I spent 32 minutes in the care of the patient today including review of labs from [place], Lipids, Thyroid Function, Hematology (current and previous including abstractions from other facilities); face-to-face time discussing  her blood glucose readings/logs, discussing hypoglycemia and hyperglycemia episodes and symptoms, medications doses, her options of short and  long term treatment based on the latest standards of care / guidelines;  discussion about incorporating lifestyle medicine;  and documenting the encounter. Risk reduction counseling performed per USPSTF guidelines to reduce obesity and cardiovascular risk factors.     Please refer to Patient Instructions for Blood Glucose Monitoring and Insulin/Medications Dosing Guide"  in media tab for additional information. Please  also refer to " Patient Self Inventory" in the Media  tab for reviewed elements of pertinent patient history.  Brendy Hewitt Shorts participated in the discussions, expressed understanding, and voiced agreement with the above plans.  All questions were answered to her satisfaction. she is encouraged to contact clinic should she have any questions or concerns prior to her return visit.    Follow up plan: Return for Keep Reg. Appt. with Pre-visit Labs.  Rayetta Pigg, [facility] [address] [place], [place] [address] Phone: [phone] Fax: [phone]  04/12/2022, 9:12 AM

## 2022-04-14 ENCOUNTER — Encounter: Payer: Self-pay | Admitting: Family Medicine

## 2022-04-14 ENCOUNTER — Ambulatory Visit (INDEPENDENT_AMBULATORY_CARE_PROVIDER_SITE_OTHER): Payer: Medicare Other | Admitting: Family Medicine

## 2022-04-14 VITALS — BP 137/60 | HR 69 | Ht 61.0 in | Wt 122.0 lb

## 2022-04-14 DIAGNOSIS — E876 Hypokalemia: Secondary | ICD-10-CM | POA: Diagnosis not present

## 2022-04-14 DIAGNOSIS — E782 Mixed hyperlipidemia: Secondary | ICD-10-CM | POA: Diagnosis not present

## 2022-04-14 DIAGNOSIS — R011 Cardiac murmur, unspecified: Secondary | ICD-10-CM | POA: Diagnosis not present

## 2022-04-14 DIAGNOSIS — Z794 Long term (current) use of insulin: Secondary | ICD-10-CM | POA: Diagnosis not present

## 2022-04-14 DIAGNOSIS — E11649 Type 2 diabetes mellitus with hypoglycemia without coma: Secondary | ICD-10-CM

## 2022-04-14 DIAGNOSIS — E875 Hyperkalemia: Secondary | ICD-10-CM | POA: Diagnosis not present

## 2022-04-14 DIAGNOSIS — I10 Essential (primary) hypertension: Secondary | ICD-10-CM | POA: Diagnosis not present

## 2022-04-14 LAB — BMP8+EGFR
BUN/Creatinine Ratio: 21 (ref 12–28)
BUN: 33 mg/dL — ABNORMAL HIGH (ref 8–27)
CO2: 25 mmol/L (ref 20–29)
Calcium: 10.1 mg/dL (ref 8.7–10.3)
Chloride: 96 mmol/L (ref 96–106)
Creatinine, Ser: 1.54 mg/dL — ABNORMAL HIGH (ref 0.57–1.00)
Glucose: 312 mg/dL — ABNORMAL HIGH (ref 70–99)
Potassium: 5.4 mmol/L — ABNORMAL HIGH (ref 3.5–5.2)
Sodium: 132 mmol/L — ABNORMAL LOW (ref 134–144)
eGFR: 33 mL/min/{1.73_m2} — ABNORMAL LOW (ref >59)

## 2022-04-14 NOTE — Assessment & Plan Note (Signed)
Requests new Endo, will refer as requested, she is advised and is aware to keep all appointments with current Endo until she has established with new Provider

## 2022-04-14 NOTE — Patient Instructions (Addendum)
Reschedule August 8 appt to early October please, call if you need me before  You are referred to New Endo per your request , we will  get appt info for you. Yu need to continue to see Dr  Dorris Fetch on the schedule he has and keep your appointments, until you have established with a new PROVIDER  Stat CHEM 7 AND Egfr THIS MORNING, YOU WILL BE CONTACTED THIS AFTERNOON  SO YOU CAN KNOW IF YOU NEED TO RESUME POTASSIUM  NURSE PLS REFER FOR EYE EXAM IF SHE HAS NO APPT, THANKS  Thanks for choosing [facility], we consider it a privelige to serve you.

## 2022-04-15 ENCOUNTER — Telehealth: Payer: Self-pay | Admitting: Family Medicine

## 2022-04-15 ENCOUNTER — Encounter: Payer: Self-pay | Admitting: Family Medicine

## 2022-04-15 NOTE — Assessment & Plan Note (Signed)
Hyperlipidemia:Low fat diet discussed and encouraged.   Lipid Panel  Lab Results  Component Value Date   CHOL 192 11/24/2021   HDL 118 11/24/2021   LDLCALC 65 11/24/2021   TRIG 43 11/24/2021   CHOLHDL 1.6 11/24/2021   Controlled, no change in medication

## 2022-04-15 NOTE — Assessment & Plan Note (Signed)
Refer for echocardiogram to evaluate

## 2022-04-15 NOTE — Progress Notes (Signed)
   Emily Jennings     MRN: [id number]      DOB: 1933/11/18   HPI [name] is here for follow up from ED visit  on 04/08/2022 when she was treated for AKI with hyperkalemia, has been off of potassium since that time Requests new Endo and will refer  Blood sugar still fluctuating with highs and lows and states she is adjusting her insulin based on numbers that she gets as opposed to what she is being advised to do by Endo.Recently experienced severe hypo followed by hyperglycemia requiring hospitalization ROS Denies recent fever or chills. Denies sinus pressure, nasal congestion, ear pain or sore throat. Denies chest congestion, productive cough or wheezing. Denies chest pains, palpitations and leg swelling Denies abdominal pain, nausea, vomiting,diarrhea or constipation.   Denies dysuria, frequency, hesitancy or incontinence. Denies joint pain, swelling and limitation in mobility. Denies headaches, seizures, numbness, or tingling. Denies depression, anxiety or insomnia. Denies skin break down or rash.   PE  BP 137/60 (BP Location: Right Arm, Patient Position: Sitting, Cuff Size: Normal)   Pulse 69   Ht '5\' 1"'$  (1.549 m)   Wt 122 lb 0.6 oz (55.4 kg)   SpO2 94%   BMI 23.06 kg/m   Patient alert and oriented and in no cardiopulmonary distress.  HEENT: No facial asymmetry, EOMI,     Neck supple .  Chest: Clear to auscultation bilaterally.  CVS: S1, S2  systolic murmur, no S3.Regular rate.  ABD: Soft non tender.   Ext: No edema  MS: Adequate ROM spine, shoulders, hips and knees.  Skin: Intact, no ulcerations or rash noted.  Psych: Good eye contact, normal affect. Memory intact not anxious or depressed appearing.  CNS: CN 2-12 intact, power,  normal throughout.no focal deficits noted.   Assessment & Plan  Uncontrolled type 2 diabetes mellitus with hypoglycemia, with long-term current use of insulin ([place]) Requests new Endo, will refer as requested, she is advised and is aware  to keep all appointments with current Endo until she has established with new Provider  Hyperkalemia Updated lab needed, potassium normal off of supplement , advised pt to d/c potassium supplementt.   Essential hypertension Controlled, no change in medication   Mixed hyperlipidemia Hyperlipidemia:Low fat diet discussed and encouraged.   Lipid Panel  Lab Results  Component Value Date   CHOL 192 11/24/2021   HDL 118 11/24/2021   LDLCALC 65 11/24/2021   TRIG 43 11/24/2021   CHOLHDL 1.6 11/24/2021   Controlled, no change in medication     Heart murmur Refer for echocardiogram to evaluate

## 2022-04-15 NOTE — Telephone Encounter (Signed)
Pls advise pt that I have referred her to Cardiology here in [place] due to longstanding hypertension, recommend echocardiogram alsoto look at her heart valves ( murmur noted in record which is new)

## 2022-04-15 NOTE — Assessment & Plan Note (Signed)
Updated lab needed, potassium normal off of supplement , advised pt to d/c potassium supplementt.

## 2022-04-15 NOTE — Assessment & Plan Note (Signed)
Controlled, no change in medication  

## 2022-04-18 NOTE — Telephone Encounter (Signed)
Pt aware.

## 2022-04-19 ENCOUNTER — Telehealth: Payer: Self-pay | Admitting: "Endocrinology

## 2022-04-19 DIAGNOSIS — E1122 Type 2 diabetes mellitus with diabetic chronic kidney disease: Secondary | ICD-10-CM

## 2022-04-19 MED ORDER — ONETOUCH ULTRA VI STRP: ORAL_STRIP | 2 refills | Status: DC

## 2022-04-19 NOTE — Telephone Encounter (Signed)
Pt said she needs a refill on her one touch ultra strips. She said Dr Dorris Fetch has her testing 4x a day now instead of 2x and she is running out. [facility] [place]

## 2022-04-19 NOTE — Telephone Encounter (Signed)
Rx refill sent with instructions changed to test BG 4 times daily.

## 2022-04-25 ENCOUNTER — Ambulatory Visit: Payer: Medicare Other | Admitting: Family Medicine

## 2022-05-16 DIAGNOSIS — H401431 Capsular glaucoma with pseudoexfoliation of lens, bilateral, mild stage: Secondary | ICD-10-CM | POA: Diagnosis not present

## 2022-05-16 DIAGNOSIS — H2511 Age-related nuclear cataract, right eye: Secondary | ICD-10-CM | POA: Diagnosis not present

## 2022-05-16 DIAGNOSIS — H35363 Drusen (degenerative) of macula, bilateral: Secondary | ICD-10-CM | POA: Diagnosis not present

## 2022-05-16 DIAGNOSIS — E119 Type 2 diabetes mellitus without complications: Secondary | ICD-10-CM | POA: Diagnosis not present

## 2022-05-18 ENCOUNTER — Telehealth: Payer: Self-pay | Admitting: "Endocrinology

## 2022-05-18 DIAGNOSIS — Z794 Long term (current) use of insulin: Secondary | ICD-10-CM

## 2022-05-18 MED ORDER — HUMALOG MIX 75/25 (75-25) 100 UNIT/ML ~~LOC~~ SUSP: 15.0000 [IU] | Freq: Two times a day (BID) | SUBCUTANEOUS | 0 refills | Status: DC

## 2022-05-18 NOTE — Telephone Encounter (Signed)
Rx refill sent.

## 2022-05-18 NOTE — Telephone Encounter (Signed)
Pt said she needs a new RX sent in for Humalog. She said that she takes 15 units in the morning and 15 units at bedtime. Please make sure that it says this she says that insurance will pay for it. Please send to [facility] [place]

## 2022-05-24 ENCOUNTER — Other Ambulatory Visit: Payer: Self-pay

## 2022-05-24 DIAGNOSIS — Z794 Long term (current) use of insulin: Secondary | ICD-10-CM

## 2022-05-24 MED ORDER — HUMALOG MIX 75/25 (75-25) 100 UNIT/ML ~~LOC~~ SUSP: 15.0000 [IU] | Freq: Two times a day (BID) | SUBCUTANEOUS | 1 refills | Status: DC

## 2022-05-24 MED ORDER — HUMALOG MIX 75/25 (75-25) 100 UNIT/ML ~~LOC~~ SUSP: SUBCUTANEOUS | 1 refills | Status: DC

## 2022-05-25 ENCOUNTER — Other Ambulatory Visit: Payer: Self-pay

## 2022-05-25 DIAGNOSIS — Z794 Long term (current) use of insulin: Secondary | ICD-10-CM

## 2022-05-25 NOTE — Telephone Encounter (Signed)
Erroneous

## 2022-06-28 ENCOUNTER — Ambulatory Visit (INDEPENDENT_AMBULATORY_CARE_PROVIDER_SITE_OTHER): Payer: Medicare Other | Admitting: Family Medicine

## 2022-06-28 ENCOUNTER — Encounter: Payer: Self-pay | Admitting: Family Medicine

## 2022-06-28 VITALS — BP 150/70 | HR 74 | Ht 60.0 in | Wt 126.0 lb

## 2022-06-28 DIAGNOSIS — I10 Essential (primary) hypertension: Secondary | ICD-10-CM | POA: Diagnosis not present

## 2022-06-28 DIAGNOSIS — Z794 Long term (current) use of insulin: Secondary | ICD-10-CM

## 2022-06-28 DIAGNOSIS — E782 Mixed hyperlipidemia: Secondary | ICD-10-CM

## 2022-06-28 DIAGNOSIS — E11649 Type 2 diabetes mellitus with hypoglycemia without coma: Secondary | ICD-10-CM | POA: Diagnosis not present

## 2022-06-28 DIAGNOSIS — Z23 Encounter for immunization: Secondary | ICD-10-CM | POA: Diagnosis not present

## 2022-06-28 NOTE — Assessment & Plan Note (Signed)
Reports ongoing lability in blood sugars Emily Jennings is reminded of the importance of commitment to daily physical activity for 30 minutes or more, as able and the need to limit carbohydrate intake to 30 to 60 grams per meal to help with blood sugar control.   The need to take medication as prescribed, test blood sugar as directed, and to call between visits if there is a concern that blood sugar is uncontrolled is also discussed.   Emily Jennings is reminded of the importance of daily foot exam, annual eye examination, and good blood sugar, blood pressure and cholesterol control.     Latest Ref Rng & Units 04/14/2022   10:03 AM 04/08/2022    4:12 PM 04/07/2022    4:44 PM 03/27/2022    3:38 AM 03/26/2022    7:13 AM  Diabetic Labs  Creatinine 0.57 - 1.00 mg/dL 1.54  1.86  2.33  1.40  1.40       06/28/2022    2:06 PM 06/28/2022    1:54 PM 06/28/2022    1:52 PM 04/14/2022    9:16 AM 04/12/2022    8:41 AM 04/08/2022   10:18 PM 04/08/2022    9:00 PM  BP/Weight  Systolic BP 250 037 048 889 169 450 388  Diastolic BP 70 62 69 60 58 77 66  Wt. (Lbs)   126.04 122.04 123.4    BMI   24.62 kg/m2 23.06 kg/m2 23.32 kg/m2        Latest Ref Rng & Units 11/10/2020    1:18 PM 11/10/2020   12:00 AM  Foot/eye exam completion dates  Eye Exam No Retinopathy No Retinopathy     No Retinopathy         This result is from an external source.  Managed by Endo

## 2022-06-28 NOTE — Patient Instructions (Addendum)
Annual  exam in January with MD, pleasse scheduel, call if you need me before  Flu vaccine today  Need Covid vaccine , and Shingles vaccine  Cmp and EGFR, today  You will be contacted re your appointment with Endocrinology, the new Provider   Thanks for choosing [facility], we consider it a privelige to serve you.

## 2022-06-28 NOTE — Assessment & Plan Note (Addendum)
DASH diet and commitment to daily physical activity for a minimum of 30 minutes discussed and encouraged, as a part of hypertension management. The importance of attaining a healthy weight is also discussed. Not at goal, depeneding pon lab may increase dose of spiromolactone, pt to bring BP cuff  To next visit, reports better  BP at home     06/28/2022    2:06 PM 06/28/2022    1:54 PM 06/28/2022    1:52 PM 04/14/2022    9:16 AM 04/12/2022    8:41 AM 04/08/2022   10:18 PM 04/08/2022    9:00 PM  BP/Weight  Systolic BP 435 391 225 834 621 947 125  Diastolic BP 70 62 69 60 58 77 66  Wt. (Lbs)   126.04 122.04 123.4    BMI   24.62 kg/m2 23.06 kg/m2 23.32 kg/m2

## 2022-06-28 NOTE — Progress Notes (Signed)
Emily Jennings     MRN: [id number]      DOB: 1934/01/12   HPI Emily Jennings is here for follow up and re-evaluation of chronic medical conditions, medication management and review of any available recent lab and radiology data.  Preventive health is updated, specifically  Cancer screening and Immunization.   Still waiting on appt with new Endo and reports fluctuation in blood sugar values  ROS Denies recent fever or chills. Denies sinus pressure, nasal congestion, ear pain or sore throat. Denies chest congestion, productive cough or wheezing. Denies chest pains, palpitations and leg swelling Denies abdominal pain, nausea, vomiting,diarrhea or constipation.   Denies dysuria, frequency, hesitancy or incontinence. Denies uncontrolled  joint pain, swelling and limitation in mobility. Denies headaches, seizures, numbness, or tingling. Denies depression, anxiety or insomnia. Denies skin break down or rash.   PE  BP (!) 150/70   Pulse 74   Ht 5' (1.524 m)   Wt 126 lb 0.6 oz (57.2 kg)   SpO2 94%   BMI 24.62 kg/m   Patient alert and oriented and in no cardiopulmonary distress.  HEENT: No facial asymmetry, EOMI,     Neck supple .  Chest: Clear to auscultation bilaterally.  CVS: S1, S2 no murmurs, no S3.Regular rate.  ABD: Soft non tender.   Ext: No edema  MS: Adequate ROM spine, shoulders, hips and knees.  Skin: Intact, no ulcerations or rash noted.  Psych: Good eye contact, normal affect. Memory intact not anxious or depressed appearing.  CNS: CN 2-12 intact, power,  normal throughout.no focal deficits noted.   Assessment & Plan  Essential hypertension DASH diet and commitment to daily physical activity for a minimum of 30 minutes discussed and encouraged, as a part of hypertension management. The importance of attaining a healthy weight is also discussed. Not at goal, depeneding pon lab may increase dose of spiromolactone, pt to bring BP cuff  To next visit, reports better   BP at home     06/28/2022    2:06 PM 06/28/2022    1:54 PM 06/28/2022    1:52 PM 04/14/2022    9:16 AM 04/12/2022    8:41 AM 04/08/2022   10:18 PM 04/08/2022    9:00 PM  BP/Weight  Systolic BP 546 503 546 568 127 517 001  Diastolic BP 70 62 69 60 58 77 66  Wt. (Lbs)   126.04 122.04 123.4    BMI   24.62 kg/m2 23.06 kg/m2 23.32 kg/m2         Mixed hyperlipidemia Hyperlipidemia:Low fat diet discussed and encouraged.   Lipid Panel  Lab Results  Component Value Date   CHOL 192 11/24/2021   HDL 118 11/24/2021   LDLCALC 65 11/24/2021   TRIG 43 11/24/2021   CHOLHDL 1.6 11/24/2021     Controlled, no change in medication   Uncontrolled type 2 diabetes mellitus with hypoglycemia, with long-term current use of insulin ([facility]) Reports ongoing lability in blood sugars Emily Jennings is reminded of the importance of commitment to daily physical activity for 30 minutes or more, as able and the need to limit carbohydrate intake to 30 to 60 grams per meal to help with blood sugar control.   The need to take medication as prescribed, test blood sugar as directed, and to call between visits if there is a concern that blood sugar is uncontrolled is also discussed.   Emily Jennings is reminded of the importance of daily foot exam, annual eye examination, and good  blood sugar, blood pressure and cholesterol control.     Latest Ref Rng & Units 04/14/2022   10:03 AM 04/08/2022    4:12 PM 04/07/2022    4:44 PM 03/27/2022    3:38 AM 03/26/2022    7:13 AM  Diabetic Labs  Creatinine 0.57 - 1.00 mg/dL 1.54  1.86  2.33  1.40  1.40       06/28/2022    2:06 PM 06/28/2022    1:54 PM 06/28/2022    1:52 PM 04/14/2022    9:16 AM 04/12/2022    8:41 AM 04/08/2022   10:18 PM 04/08/2022    9:00 PM  BP/Weight  Systolic BP 254 982 641 583 094 076 808  Diastolic BP 70 62 69 60 58 77 66  Wt. (Lbs)   126.04 122.04 123.4    BMI   24.62 kg/m2 23.06 kg/m2 23.32 kg/m2        Latest Ref Rng & Units 11/10/2020    1:18 PM  11/10/2020   12:00 AM  Foot/eye exam completion dates  Eye Exam No Retinopathy No Retinopathy     No Retinopathy         This result is from an external source.  Managed by Endo

## 2022-06-28 NOTE — Assessment & Plan Note (Signed)
Hyperlipidemia:Low fat diet discussed and encouraged.   Lipid Panel  Lab Results  Component Value Date   CHOL 192 11/24/2021   HDL 118 11/24/2021   LDLCALC 65 11/24/2021   TRIG 43 11/24/2021   CHOLHDL 1.6 11/24/2021     Controlled, no change in medication

## 2022-06-29 LAB — CMP14+EGFR
ALT: 13 IU/L (ref 0–32)
AST: 24 IU/L (ref 0–40)
Albumin/Globulin Ratio: 1.6 (ref 1.2–2.2)
Albumin: 4 g/dL (ref 3.7–4.7)
Alkaline Phosphatase: 101 IU/L (ref 44–121)
BUN/Creatinine Ratio: 24 (ref 12–28)
BUN: 45 mg/dL — ABNORMAL HIGH (ref 8–27)
Bilirubin Total: 0.2 mg/dL (ref 0.0–1.2)
CO2: 23 mmol/L (ref 20–29)
Calcium: 9.3 mg/dL (ref 8.7–10.3)
Chloride: 98 mmol/L (ref 96–106)
Creatinine, Ser: 1.9 mg/dL — ABNORMAL HIGH (ref 0.57–1.00)
Globulin, Total: 2.5 g/dL (ref 1.5–4.5)
Glucose: 352 mg/dL — ABNORMAL HIGH (ref 70–99)
Potassium: 5 mmol/L (ref 3.5–5.2)
Sodium: 135 mmol/L (ref 134–144)
Total Protein: 6.5 g/dL (ref 6.0–8.5)
eGFR: 25 mL/min/{1.73_m2} — ABNORMAL LOW (ref >59)

## 2022-07-04 ENCOUNTER — Telehealth: Payer: Self-pay | Admitting: Family Medicine

## 2022-07-04 NOTE — Telephone Encounter (Signed)
Needs endo referral placed again for [facility]

## 2022-07-06 ENCOUNTER — Ambulatory Visit: Payer: Medicare Other | Admitting: "Endocrinology

## 2022-07-06 ENCOUNTER — Other Ambulatory Visit: Payer: Self-pay | Admitting: Family Medicine

## 2022-07-06 ENCOUNTER — Encounter: Payer: Self-pay | Admitting: "Endocrinology

## 2022-07-06 VITALS — BP 126/58 | HR 64 | Ht 60.0 in | Wt 126.8 lb

## 2022-07-06 DIAGNOSIS — Z794 Long term (current) use of insulin: Secondary | ICD-10-CM

## 2022-07-06 DIAGNOSIS — N184 Chronic kidney disease, stage 4 (severe): Secondary | ICD-10-CM

## 2022-07-06 DIAGNOSIS — I10 Essential (primary) hypertension: Secondary | ICD-10-CM | POA: Diagnosis not present

## 2022-07-06 DIAGNOSIS — E782 Mixed hyperlipidemia: Secondary | ICD-10-CM | POA: Diagnosis not present

## 2022-07-06 DIAGNOSIS — N1831 Chronic kidney disease, stage 3a: Secondary | ICD-10-CM

## 2022-07-06 DIAGNOSIS — E1122 Type 2 diabetes mellitus with diabetic chronic kidney disease: Secondary | ICD-10-CM

## 2022-07-06 LAB — POCT GLYCOSYLATED HEMOGLOBIN (HGB A1C): HbA1c, POC (controlled diabetic range): 7.4 % — AB (ref 0.0–7.0)

## 2022-07-06 NOTE — Progress Notes (Signed)
07/06/2022  Endocrinology follow-up note   Subjective:    Patient ID: Emily Jennings, female    DOB: Nov 28, 1933,    Past Medical History:  Diagnosis Date   Allergy    Anxiety    ANXIETY DISORDER, GENERALIZED 07/15/2007   Qualifier: Diagnosis of  By: Rennie Plowman     Arthritis    Cancer [facility]) 2009   breast, carcinoma in situ left   Carcinoma in situ of breast 05/21/2008   Qualifier: Diagnosis of  By: Moshe Cipro MD, Margaret  Diagnosed in 2009, completed 5 year course of tamoxifen, no evidence of recurrence    Carotid stenosis    11/16/2005  mild plaque formation and stenosis proximal right ECA   Cataract    Complication of anesthesia    Coronary artery disease    cardiac catheterization on 03/20/2006  LAD mid 40% stenosis, left circumflex mild 40% stenosis, RCA mid-vessel 40% to 50% lesion   EF 60%   Diabetes mellitus    GERD (gastroesophageal reflux disease)    Hernia, inguinal    left   Hyperglycemia    Hypertension    Insomnia 11/16/2011   Low blood potassium    Non-insulin dependent type 2 diabetes mellitus ([place])    Osteoporosis    Shortness of breath    2D Echocardiogram 01/26/2009   EF of greater than 55%, mild MR, mild TR, normal ventricular function   Thickened endometrium 10/26/2017   Noted by gyne in 2017, missed 6 month follow up, referred in 09/2017   Ventricular tachycardia, non-sustained ([place])    developed during stress test 02/08/2006, spontaneously aborted, mild reversible apical defect   Past Surgical History:  Procedure Laterality Date   BREAST LUMPECTOMY Left 2009   Left breast 2009   CATARACT EXTRACTION W/PHACO Left 10/28/2014   Procedure: PHACO EMULSION CATARACT EXTRACTION WITH INTRAOCULAR LENS IMPLANT LEFT EYE ([place]);  Surgeon: Marylynn Pearson, MD;  Location: [place];  Service: Ophthalmology;  Laterality: Left;   COLONOSCOPY     cyst removed from left foot     REFRACTIVE SURGERY Left    Social History   Socioeconomic History   Marital status: Married     Spouse name: Not on file   Number of children: 2   Years of education: na   Highest education level: Some college, no degree  Occupational History   Occupation: retired    Fish farm manager: retired  Tobacco Use   Smoking status: Never   Smokeless tobacco: Never  Scientific laboratory technician Use: Never used  Substance and Sexual Activity   Alcohol use: No   Drug use: No   Sexual activity: Not Currently    Birth control/protection: Post-menopausal  Other Topics Concern   Not on file  Social History Narrative   Lives with her husband and son and attends yoga at the [facility] 2 days a week and speaks to her grandson nightly on the phone   Social Determinants of Health   Financial Resource Strain: Low Risk  (11/14/2021)   Overall Financial Resource Strain (CARDIA)    Difficulty of Paying Living Expenses: Not hard at all  Food Insecurity: No Food Insecurity (11/14/2021)   Hunger Vital Sign    Worried About Running Out of Food in the Last Year: Never true    [place] in the Last Year: Never true  Transportation Needs: No Transportation Needs (11/14/2021)   PRAPARE - Transportation    Lack of Transportation (Medical): No    Lack of  Transportation (Non-Medical): No  Physical Activity: Insufficiently Active (11/14/2021)   Exercise Vital Sign    Days of Exercise per Week: 7 days    Minutes of Exercise per Session: 20 min  Stress: No Stress Concern Present (11/14/2021)   [place]    Feeling of Stress : Only a little  Social Connections: Moderately Integrated (11/14/2021)   Social Connection and Isolation Panel [NHANES]    Frequency of Communication with Friends and Family: More than three times a week    Frequency of Social Gatherings with Friends and Family: Once a week    Attends Religious Services: More than 4 times per year    Active Member of [employer] or Organizations: No    Attends Archivist Meetings: Never    Marital  Status: Married   Outpatient Encounter Medications as of 07/06/2022  Medication Sig   acetaminophen (TYLENOL) 500 MG tablet Take one tablet two times daily for 3 days, then as needed   alendronate (FOSAMAX) 70 MG tablet TAKE 1 TABLET BY MOUTH EVERY 7 DAYS. TAKE WITH A FULL GLASS OF WATER ON AN EMPTY STOMACH (Patient taking differently: Take 70 mg by mouth once a week. TAKE 1 TABLET BY MOUTH EVERY 7 DAYS. TAKE WITH A FULL GLASS OF WATER ON AN EMPTY STOMACH)   amLODipine-olmesartan (AZOR) 10-40 MG tablet TAKE 1 TABLET BY MOUTH EVERY DAY (Patient taking differently: Take 1 tablet by mouth daily.)   aspirin EC 81 MG tablet Take 81 mg by mouth daily.   blood glucose meter kit and supplies KIT One touch Ultra. Use three times daily as directed. (FOR ICD E11.65)   carvedilol (COREG) 12.5 MG tablet TAKE 1 TABLET (12.5MG TOTAL) BY MOUTH TWICE A DAY WITH MEALS (Patient taking differently: Take 12.5 mg by mouth 2 (two) times daily with a meal.)   cholecalciferol (VITAMIN D3) 25 MCG (1000 UT) tablet Take 2,000 Units by mouth daily.   dicyclomine (BENTYL) 10 MG capsule Take 1 capsule (10 mg total) by mouth 3 (three) times daily before meals.   glucose blood (ONETOUCH ULTRA) test strip USE TO TEST 4 TIMES A DAY   insulin lispro protamine-lispro (HUMALOG MIX 75/25) (75-25) 100 UNIT/ML SUSP injection 15 units bid if BG above 90   Insulin Pen Needle 32G X 4 MM MISC 1 each by Does not apply route as directed.   Insulin Syringe-Needle U-100 (BD INSULIN SYRINGE U/F) 31G X 5/16" 0.5 ML MISC USE AS DIRECTED TWICE DAILY   Multiple Vitamins-Minerals (CENTRUM SILVER 50+WOMEN) TABS Take 1 tablet by mouth daily.   mupirocin ointment (BACTROBAN) 2 % Apply topically 2 (two) times daily.   OneTouch Delica Lancets 13K MISC 1 each by Does not apply route 3 (three) times daily. Use to check glucose three times daily   RESTASIS 0.05 % ophthalmic emulsion Place 1 drop into both eyes every evening.   rosuvastatin (CRESTOR) 5 MG  tablet TAKE 1 TABLET (5 MG TOTAL) BY MOUTH DAILY. (Patient taking differently: Take 5 mg by mouth daily.)   spironolactone (ALDACTONE) 50 MG tablet TAKE 1 TABLET BY MOUTH EVERY DAY (Patient taking differently: Take 50 mg by mouth daily.)   No facility-administered encounter medications on file as of 07/06/2022.   ALLERGIES: Allergies  Allergen Reactions   Benadryl [Diphenhydramine Hcl] Hypertension   Citalopram Other (See Comments)    unknown   Metformin And Related Diarrhea    Lost appetite and weight    Tramadol Other (  See Comments)    Felt light headed and dizzy   Crestor [Rosuvastatin] Other (See Comments)    "Feet swelling", makes her feel weak   VACCINATION STATUS: Immunization History  Administered Date(s) Administered   Fluad Quad(high Dose 65+) 06/02/2019, 05/27/2020, 05/27/2021, 06/28/2022   Influenza Split 07/17/2011   Influenza Whole 06/18/2007, 07/29/2009, 05/16/2013   Influenza, High Dose Seasonal PF 08/13/2018   Influenza,inj,Quad PF,6+ Mos 05/19/2014, 08/22/2016, 05/17/2017   Moderna Covid-19 Vaccine Bivalent Booster 30yr & up 02/17/2022   Moderna SARS-COV2 Booster Vaccination 01/31/2021   Moderna Sars-Covid-2 Vaccination 10/26/2019, 11/26/2019, 07/29/2020   Pneumococcal Conjugate-13 03/24/2014   Pneumococcal Polysaccharide-23 05/20/2008   Td 06/18/2002   Tdap 07/17/2011, 12/28/2020    Diabetes She presents for her follow-up diabetic visit. She has type 2 diabetes mellitus. Onset time: She was diagnosed at approximate age of 89years. Her disease course has been improving. There are no hypoglycemic associated symptoms. Pertinent negatives for hypoglycemia include no confusion, headaches, pallor or seizures. There are no diabetic associated symptoms. Pertinent negatives for diabetes include no chest pain, no fatigue, no polydipsia, no polyphagia and no polyuria. There are no hypoglycemic complications. Symptoms are improving. Diabetic complications include heart  disease and nephropathy. Risk factors for coronary artery disease include diabetes mellitus, dyslipidemia, hypertension, sedentary lifestyle and post-menopausal. Current diabetic treatment includes insulin injections. She is compliant with treatment most of the time. Her weight is fluctuating minimally. She is following a generally unhealthy diet. When asked about meal planning, she reported none. She has had a previous visit with a dietitian. She never participates in exercise. Her home blood glucose trend is decreasing steadily. Her breakfast blood glucose range is generally 140-180 mg/dl. Her lunch blood glucose range is generally 140-180 mg/dl. Her dinner blood glucose range is generally 140-180 mg/dl. Her bedtime blood glucose range is generally 140-180 mg/dl. Her overall blood glucose range is 140-180 mg/dl. (She presents with improving glycemic profile.  Her point-of-care A1c is 7.4%.  She did not document any significant hypoglycemia. ) An ACE inhibitor/angiotensin II receptor blocker is being taken. She does not see a podiatrist.Eye exam is current.  Hyperlipidemia This is a chronic problem. The current episode started more than 1 year ago. The problem is controlled. Recent lipid tests were reviewed and are normal. Exacerbating diseases include chronic renal disease and diabetes. Factors aggravating her hyperlipidemia include beta blockers and fatty foods. Pertinent negatives include no chest pain, myalgias or shortness of breath. Current antihyperlipidemic treatment includes statins. The current treatment provides moderate improvement of lipids. Compliance problems include adherence to diet and adherence to exercise.  Risk factors for coronary artery disease include diabetes mellitus, dyslipidemia, a sedentary lifestyle, hypertension and post-menopausal.  Hypertension This is a chronic problem. The current episode started more than 1 year ago. The problem has been waxing and waning since onset. The  problem is uncontrolled. Pertinent negatives include no chest pain, headaches, palpitations or shortness of breath. Risk factors for coronary artery disease include diabetes mellitus, dyslipidemia, sedentary lifestyle and post-menopausal state. Past treatments include angiotensin blockers, calcium channel blockers, beta blockers and diuretics. The current treatment provides moderate improvement. There are no compliance problems.  Hypertensive end-organ damage includes kidney disease and CAD/MI. Identifiable causes of hypertension include chronic renal disease.    Review of systems  Constitutional: + Minimally fluctuating body weight,  current Body mass index is 24.76 kg/m. , no fatigue, no subjective hyperthermia, no subjective hypothermia    Objective:    BP (!) 126/58  Pulse 64   Ht 5' (1.524 m)   Wt 126 lb 12.8 oz (57.5 kg)   BMI 24.76 kg/m   Wt Readings from Last 3 Encounters:  07/06/22 126 lb 12.8 oz (57.5 kg)  06/28/22 126 lb 0.6 oz (57.2 kg)  04/14/22 122 lb 0.6 oz (55.4 kg)    BP Readings from Last 3 Encounters:  07/06/22 (!) 126/58  06/28/22 (!) 150/70  04/14/22 137/60    Physical Exam- Limited  Constitutional:  Body mass index is 24.76 kg/m. , not in acute distress, mildly anxious state of mind, forgetful at times     Results for orders placed or performed in visit on 07/06/22  HgB A1c  Result Value Ref Range   Hemoglobin A1C     HbA1c POC (<> result, manual entry)     HbA1c, POC (prediabetic range)     HbA1c, POC (controlled diabetic range) 7.4 (A) 0.0 - 7.0 %   Complete Blood Count (Most recent): Lab Results  Component Value Date   WBC 7.2 04/08/2022   HGB 11.4 (L) 04/08/2022   HCT 34.7 (L) 04/08/2022   MCV 95.6 04/08/2022   PLT 315 04/08/2022   Chemistry (most recent): Lab Results  Component Value Date   NA 135 06/28/2022   K 5.0 06/28/2022   CL 98 06/28/2022   CO2 23 06/28/2022   BUN 45 (H) 06/28/2022   CREATININE 1.90 (H) 06/28/2022    Diabetic Labs (most recent): Lab Results  Component Value Date   HGBA1C 7.4 (A) 07/06/2022   HGBA1C 7.2 (H) 03/22/2022   HGBA1C 7.2 (A) 03/08/2022   MICROALBUR 150 11/17/2020   MICROALBUR 22.9 04/03/2019   MICROALBUR 1,218.8 (H) 12/05/2017   Lipid Panel     Component Value Date/Time   CHOL 192 11/24/2021 0837   TRIG 43 11/24/2021 0837   HDL 118 11/24/2021 0837   CHOLHDL 1.6 11/24/2021 0837   CHOLHDL 1.8 06/29/2020 0916   VLDL 8 10/31/2016 0845   LDLCALC 65 11/24/2021 0837   LDLCALC 80 06/29/2020 0916     Assessment & Plan:   1) Type 2 diabetes mellitus with stage 3 chronic kidney disease, with long-term current use of insulin   She presents with improving glycemic profile.  Her point-of-care A1c is 7.4%.  She did not document any significant hypoglycemia.   Patient remains at a high risk for more acute and chronic complications of diabetes which include CAD, CVA, CKD, retinopathy, and neuropathy. These are all discussed in detail with the patient.  - Nutritional counseling repeated at each appointment due to patients tendency to fall back in to old habits.   she acknowledges that there is a room for improvement in her food and drink choices. - Suggestion is made for her to avoid simple carbohydrates  from her diet including Cakes, Sweet Desserts, Ice Cream, Soda (diet and regular), Sweet Tea, Candies, Chips, Cookies, Store Bought Juices, Alcohol , Artificial Sweeteners,  Coffee Creamer, and "Sugar-free" Products, Lemonade. This will help patient to have more stable blood glucose profile and potentially avoid unintended weight gain.  The following Lifestyle Medicine recommendations according to [place]  [facility]) were discussed and and offered to patient and she  agrees to start the journey:  A. Whole Foods, Plant-Based Nutrition comprising of fruits and vegetables, plant-based proteins, whole-grain carbohydrates was discussed in detail with the  patient.   A list for source of those nutrients were also provided to the patient.  Patient will use  only water or unsweetened tea for hydration.   - I encouraged the patient to switch to  unprocessed or minimally processed complex starch and increased protein intake (animal or plant source), fruits, and vegetables.   - Patient is advised to stick to a routine mealtimes to eat 3 meals  a day and avoid unnecessary snacks ( to snack only to correct hypoglycemia).  I have approached patient to continue on  intensive monitoring of blood glucose and insulin therapy, and patient agrees.   - She continued to do fairly well on a simplified treatment with premixed insulin.    -Avoiding hypoglycemia is the #1 priority in her case, she does not adequate social support.  -She presents with near target glycemic profile.  She is advised to continue Humalog 75/25 15 units with breakfast and 15 units with supper   only if her Premeal blood glucose readings above 90 mg per DL.   She is advised to stay off of glipizide.  She is advised and willing to continue to monitor blood glucose 4 times a day-daily before meals and at bedtime.      She is instructed to call the clinic if she has glucose readings less than 70 or greater than 200 for 3 tests in a row.    - She is warned not to inject insulin without proper monitoring of blood glucose.  -Target numbers for A1c, LDL, HDL, Triglycerides, Waist Circumference were discussed in detail.  2) HTN:  -Her blood pressure is controlled to target.  She is advised to continue Amlodipine 10 mg po daily, Losartan 50 mg po daily, Metoprolol 50 mg po twice daily, and Spironolactone 25 mg po daily.  May need to adjust dose at next visit if BP remains elevated.  3) Lipids:  -Her recent lipid panel showed LDL controlled at 65.  She is advised to continue Crestor 5 mg p.o. daily at bedtime.  She is also on  Lovaza 1 gm po twice daily.  Side effects and precautions discussed  with patient.  4)  Weight/Diet:  Her Body mass index is 24.76 kg/m.-not a candidate for weight loss.  CDE consult in progress, exercise, and carbs information provided.   5) Chronic Care: -Patient is on ARB medications and Statins and encouraged to continue to follow up with Ophthalmology, Podiatrist at least yearly or according to recommendations, and advised to stay away from smoking. I have recommended yearly flu vaccine and pneumonia vaccination at least every 5 years; and  sleep for at least 7 hours a day.   She is advised to maintain close follow-up with her PCP:  [name], MD    I spent 32 minutes in the care of the patient today including review of labs from [place], Lipids, Thyroid Function, Hematology (current and previous including abstractions from other facilities); face-to-face time discussing  her blood glucose readings/logs, discussing hypoglycemia and hyperglycemia episodes and symptoms, medications doses, her options of short and long term treatment based on the latest standards of care / guidelines;  discussion about incorporating lifestyle medicine;  and documenting the encounter. Risk reduction counseling performed per USPSTF guidelines to reduce cardiovascular risk factors.     Please refer to Patient Instructions for Blood Glucose Monitoring and Insulin/Medications Dosing Guide"  in media tab for additional information. Please  also refer to " Patient Self Inventory" in the Media  tab for reviewed elements of pertinent patient history.  Samanda Hewitt Shorts participated in the discussions, expressed understanding, and voiced agreement  with the above plans.  All questions were answered to her satisfaction. she is encouraged to contact clinic should she have any questions or concerns prior to her return visit.    Follow up plan: Return in about 4 months (around 11/06/2022) for F/U with Pre-visit Labs, Meter/CGM/Logs, A1c here.  Rayetta Pigg, [facility] [address] [place],  [place] [address] Phone: [phone] Fax: [phone]  07/06/2022, 1:49 PM

## 2022-07-11 ENCOUNTER — Ambulatory Visit: Payer: Medicare Other | Admitting: "Endocrinology

## 2022-07-15 ENCOUNTER — Other Ambulatory Visit: Payer: Self-pay | Admitting: "Endocrinology

## 2022-07-15 DIAGNOSIS — N1831 Chronic kidney disease, stage 3a: Secondary | ICD-10-CM

## 2022-07-20 ENCOUNTER — Other Ambulatory Visit: Payer: Self-pay | Admitting: Family Medicine

## 2022-07-20 ENCOUNTER — Ambulatory Visit: Payer: Medicare Other | Attending: Cardiology | Admitting: Cardiology

## 2022-07-20 ENCOUNTER — Encounter: Payer: Self-pay | Admitting: Cardiology

## 2022-07-20 VITALS — BP 140/65 | HR 69 | Ht 62.0 in | Wt 127.8 lb

## 2022-07-20 DIAGNOSIS — R011 Cardiac murmur, unspecified: Secondary | ICD-10-CM | POA: Diagnosis not present

## 2022-07-20 DIAGNOSIS — I10 Essential (primary) hypertension: Secondary | ICD-10-CM

## 2022-07-20 MED ORDER — SPIRONOLACTONE 25 MG PO TABS: 25.0000 mg | ORAL_TABLET | Freq: Every day | ORAL | 3 refills | Status: DC

## 2022-07-20 MED ORDER — CARVEDILOL 12.5 MG PO TABS: 18.7500 mg | ORAL_TABLET | Freq: Two times a day (BID) | ORAL | 3 refills | Status: DC

## 2022-07-20 NOTE — Progress Notes (Signed)
Clinical Summary Emily Jennings is a 88 y.o.female seen as a new consult, referred by Dr Moshe Cipro for the following medical problems.  1.Heart murmur - has had some mild DOE recently - no recent edema   2. HTN - compliant with meds     3. CKD IV  4. DM2   Past Medical History:  Diagnosis Date   Allergy    Anxiety    ANXIETY DISORDER, GENERALIZED 07/15/2007   Qualifier: Diagnosis of  By: Rennie Plowman     Arthritis    Cancer [facility]) 2009   breast, carcinoma in situ left   Carcinoma in situ of breast 05/21/2008   Qualifier: Diagnosis of  By: Moshe Cipro MD, Margaret  Diagnosed in 2009, completed 5 year course of tamoxifen, no evidence of recurrence    Carotid stenosis    11/16/2005  mild plaque formation and stenosis proximal right ECA   Cataract    Complication of anesthesia    Coronary artery disease    cardiac catheterization on 03/20/2006  LAD mid 40% stenosis, left circumflex mild 40% stenosis, RCA mid-vessel 40% to 50% lesion   EF 60%   Diabetes mellitus    GERD (gastroesophageal reflux disease)    Hernia, inguinal    left   Hyperglycemia    Hypertension    Insomnia 11/16/2011   Low blood potassium    Non-insulin dependent type 2 diabetes mellitus ([place])    Osteoporosis    Shortness of breath    2D Echocardiogram 01/26/2009   EF of greater than 55%, mild MR, mild TR, normal ventricular function   Thickened endometrium 10/26/2017   Noted by gyne in 2017, missed 6 month follow up, referred in 09/2017   Ventricular tachycardia, non-sustained ([place])    developed during stress test 02/08/2006, spontaneously aborted, mild reversible apical defect     Allergies  Allergen Reactions   Benadryl [Diphenhydramine Hcl] Hypertension   Citalopram Other (See Comments)    unknown   Metformin And Related Diarrhea    Lost appetite and weight    Tramadol Other (See Comments)    Felt light headed and dizzy   Crestor [Rosuvastatin] Other (See Comments)    "Feet swelling", makes  her feel weak     Current Outpatient Medications  Medication Sig Dispense Refill   acetaminophen (TYLENOL) 500 MG tablet Take one tablet two times daily for 3 days, then as needed 30 tablet 0   alendronate (FOSAMAX) 70 MG tablet TAKE 1 TABLET BY MOUTH EVERY 7 DAYS. TAKE WITH A FULL GLASS OF WATER ON AN EMPTY STOMACH (Patient taking differently: Take 70 mg by mouth once a week. TAKE 1 TABLET BY MOUTH EVERY 7 DAYS. TAKE WITH A FULL GLASS OF WATER ON AN EMPTY STOMACH) 12 tablet 1   amLODipine-olmesartan (AZOR) 10-40 MG tablet TAKE 1 TABLET BY MOUTH EVERY DAY 90 tablet 1   aspirin EC 81 MG tablet Take 81 mg by mouth daily.     blood glucose meter kit and supplies KIT One touch Ultra. Use three times daily as directed. (FOR ICD E11.65) 1 each 0   carvedilol (COREG) 12.5 MG tablet TAKE 1 TABLET (12.5MG TOTAL) BY MOUTH TWICE A DAY WITH MEALS 180 tablet 1   cholecalciferol (VITAMIN D3) 25 MCG (1000 UT) tablet Take 2,000 Units by mouth daily.     dicyclomine (BENTYL) 10 MG capsule Take 1 capsule (10 mg total) by mouth 3 (three) times daily before meals. 90 capsule 11  glucose blood (ONETOUCH ULTRA) test strip USE TO TEST 2 TIMES A DAY 100 strip 2   insulin lispro protamine-lispro (HUMALOG MIX 75/25) (75-25) 100 UNIT/ML SUSP injection 15 units bid if BG above 90 30 mL 1   Insulin Pen Needle 32G X 4 MM MISC 1 each by Does not apply route as directed. 100 each 3   Insulin Syringe-Needle U-100 (BD INSULIN SYRINGE U/F) 31G X 5/16" 0.5 ML MISC USE AS DIRECTED TWICE DAILY 100 each 3   Multiple Vitamins-Minerals (CENTRUM SILVER 50+WOMEN) TABS Take 1 tablet by mouth daily.     mupirocin ointment (BACTROBAN) 2 % Apply topically 2 (two) times daily. 22 g 1   OneTouch Delica Lancets 75I MISC 1 each by Does not apply route 3 (three) times daily. Use to check glucose three times daily 100 each 2   RESTASIS 0.05 % ophthalmic emulsion Place 1 drop into both eyes every evening.     rosuvastatin (CRESTOR) 5 MG tablet  TAKE 1 TABLET (5 MG TOTAL) BY MOUTH DAILY. (Patient taking differently: Take 5 mg by mouth daily.) 90 tablet 3   spironolactone (ALDACTONE) 50 MG tablet TAKE 1 TABLET BY MOUTH EVERY DAY (Patient taking differently: Take 50 mg by mouth daily.) 90 tablet 1   No current facility-administered medications for this visit.     Past Surgical History:  Procedure Laterality Date   BREAST LUMPECTOMY Left 2009   Left breast 2009   CATARACT EXTRACTION W/PHACO Left 10/28/2014   Procedure: PHACO EMULSION CATARACT EXTRACTION WITH INTRAOCULAR LENS IMPLANT LEFT EYE ([place]);  Surgeon: Marylynn Pearson, MD;  Location: [facility];  Service: Ophthalmology;  Laterality: Left;   COLONOSCOPY     cyst removed from left foot     REFRACTIVE SURGERY Left      Allergies  Allergen Reactions   Benadryl [Diphenhydramine Hcl] Hypertension   Citalopram Other (See Comments)    unknown   Metformin And Related Diarrhea    Lost appetite and weight    Tramadol Other (See Comments)    Felt light headed and dizzy   Crestor [Rosuvastatin] Other (See Comments)    "Feet swelling", makes her feel weak      Family History  Problem Relation Age of Onset   Hypertension Mother    Hyperlipidemia Mother    Stroke Mother    Urticaria Mother    Cancer Father        pancreatic   Colon cancer Father    Heart disease Brother 101       bypass   Heart disease Brother 65       bypass   Arthritis Other    Asthma Other    Diabetes Other    Colon cancer Paternal Aunt    Esophageal cancer Neg Hx    Stomach cancer Neg Hx    Rectal cancer Neg Hx      Social History Emily Jennings reports that she has never smoked. She has never been exposed to tobacco smoke. She has never used smokeless tobacco. Emily Jennings reports no history of alcohol use.   Review of Systems CONSTITUTIONAL: No weight loss, fever, chills, weakness or fatigue.  HEENT: Eyes: No visual loss, blurred vision, double vision or yellow sclerae.No hearing loss, sneezing,  congestion, runny nose or sore throat.  SKIN: No rash or itching.  CARDIOVASCULAR: no chest pain, no palpitations RESPIRATORY: per hpi GASTROINTESTINAL: No anorexia, nausea, vomiting or diarrhea. No abdominal pain or blood.  GENITOURINARY: No burning on urination, no polyuria  NEUROLOGICAL: No headache, dizziness, syncope, paralysis, ataxia, numbness or tingling in the extremities. No change in bowel or bladder control.  MUSCULOSKELETAL: No muscle, back pain, joint pain or stiffness.  LYMPHATICS: No enlarged nodes. No history of splenectomy.  PSYCHIATRIC: No history of depression or anxiety.  ENDOCRINOLOGIC: No reports of sweating, cold or heat intolerance. No polyuria or polydipsia.  Marland Kitchen   Physical Examination Vitals:   07/20/22 1338 07/20/22 1408  BP: (!) 142/64 (!) 140/65  Pulse: 69   SpO2: 98%    Filed Weights   07/20/22 1338  Weight: 127 lb 12.8 oz (58 kg)    Gen: resting comfortably, no acute distress HEENT: no scleral icterus, pupils equal round and reactive, no palptable cervical adenopathy,  CV: RRR, 3/6 systolic murmur rusb and apex, no jvd Resp: Clear to auscultation bilaterally GI: abdomen is soft, non-tender, non-distended, normal bowel sounds, no hepatosplenomegaly MSK: extremities are warm, no edema.  Skin: warm, no rash Neuro:  no focal deficits Psych: appropriate affect     Assessment and Plan  1.Heart murmur - obtain echo to further evaluat  2. HTN - above goal - GFR 25, cautious use of ARB and aldactone. Would lower aldactone to 27m daily, can increase coreg to 18.763mbid      JoArnoldo LenisM.D.

## 2022-07-20 NOTE — Patient Instructions (Addendum)
Medication Instructions:  Your physician has recommended you make the following change in your medication:  Decrease Aldactone to 25 mg tablets daily Increase Coreg to 18.75 mg tablets twice daily   Labwork: None  Testing/Procedures: Your physician has requested that you have an echocardiogram. Echocardiography is a painless test that uses sound waves to create images of your heart. It provides your doctor with information about the size and shape of your heart and how well your heart's chambers and valves are working. This procedure takes approximately one hour. There are no restrictions for this procedure. Please do NOT wear cologne, perfume, aftershave, or lotions (deodorant is allowed). Please arrive 15 minutes prior to your appointment time.   Follow-Up: Follow up with Dr. Harl Bowie in 6 months  Any Other Special Instructions Will Be Listed Below (If Applicable).     If you need a refill on your cardiac medications before your next appointment, please call your pharmacy.

## 2022-08-01 ENCOUNTER — Ambulatory Visit (HOSPITAL_COMMUNITY)
Admission: RE | Admit: 2022-08-01 | Discharge: 2022-08-01 | Disposition: A | Discharge status: Home / Self Care | Payer: Medicare Other | Source: Ambulatory Visit | Attending: Cardiology | Admitting: Cardiology

## 2022-08-01 DIAGNOSIS — R011 Cardiac murmur, unspecified: Secondary | ICD-10-CM | POA: Diagnosis not present

## 2022-08-01 LAB — ECHOCARDIOGRAM COMPLETE
AR max vel: 1.44 cm2
AV Area VTI: 1.64 cm2
AV Area mean vel: 1.65 cm2
AV Mean grad: 14.7 mmHg
AV Peak grad: 28.9 mmHg
Ao pk vel: 2.69 m/s
Area-P 1/2: 2.45 cm2
S' Lateral: 2 cm

## 2022-08-01 NOTE — Progress Notes (Signed)
*  PRELIMINARY RESULTS* Echocardiogram 2D Echocardiogram has been performed.  Samuel Germany 08/01/2022, 12:14 PM

## 2022-08-07 ENCOUNTER — Telehealth: Payer: Self-pay | Admitting: Family Medicine

## 2022-08-07 NOTE — Telephone Encounter (Signed)
Patient called and was seen at heart doctor taking pironolactone (ALDACTONE) 25 MG tablet  and now heart doctor told her was not good for her kidney, but her feet started swelling again  so patient started to take 50 mg now.  Patient said her feet swells at 25 mg but when she takes '50mg'$  she has no swelling. Patient asked for nurse to give her a call back. [phone].

## 2022-08-09 NOTE — Telephone Encounter (Signed)
LMTRC

## 2022-08-15 ENCOUNTER — Telehealth: Payer: Self-pay | Admitting: Cardiology

## 2022-08-15 NOTE — Telephone Encounter (Signed)
Patient notified and verbalized understanding. Patient had no questions or concerns at this time.  

## 2022-08-15 NOTE — Telephone Encounter (Signed)
-----   Message from Arnoldo Lenis, MD sent at 08/14/2022  8:21 AM EST ----- Echo shows heart pumping function is normal. Her aortic valve is mildly stiffened which creates her heart murmur but overall is working well and not a concern at this time, just something to continue to monitor  Zandra Abts MD

## 2022-08-15 NOTE — Telephone Encounter (Signed)
Pt is returning call in regards to results. Transferred to [place], [place].

## 2022-08-24 ENCOUNTER — Other Ambulatory Visit: Payer: Self-pay | Admitting: Family Medicine

## 2022-08-25 ENCOUNTER — Telehealth: Payer: Self-pay | Admitting: Family Medicine

## 2022-08-25 NOTE — Telephone Encounter (Signed)
States the spironolactone is bad for your kidneys and it has been making her feel bad and she didn't take any today. There are 2 different mg's on her medlist. '50mg'$  from you and 25 from cardiology. She states she was taking the '50mg'$ . States with her chronic dehydration and kidney issues she doesn't want to take it anymore

## 2022-08-28 ENCOUNTER — Telehealth: Payer: Self-pay | Admitting: Family Medicine

## 2022-08-28 NOTE — Telephone Encounter (Signed)
Spoke with patient she will come to be seen 08/29/22 @ 9:00 she is aware if anything changes she will go to ER to be evaluated.

## 2022-08-28 NOTE — Telephone Encounter (Signed)
Patient called with leg pain and upset stomach she is scheduled to be seen 08/29/22 @ 9:00

## 2022-08-28 NOTE — Telephone Encounter (Signed)
Pt called stating that she does not think the medication is agreeing with her. States she has leg swelling, diarrhea, & SOB. Wants to be called back by nurse?   Message sent in emergency chat

## 2022-08-29 ENCOUNTER — Encounter: Payer: Self-pay | Admitting: Family Medicine

## 2022-08-29 ENCOUNTER — Ambulatory Visit (INDEPENDENT_AMBULATORY_CARE_PROVIDER_SITE_OTHER): Payer: Medicare Other | Admitting: Family Medicine

## 2022-08-29 VITALS — BP 138/60 | HR 69 | Ht 62.0 in | Wt 130.1 lb

## 2022-08-29 DIAGNOSIS — E782 Mixed hyperlipidemia: Secondary | ICD-10-CM | POA: Diagnosis not present

## 2022-08-29 DIAGNOSIS — N1831 Chronic kidney disease, stage 3a: Secondary | ICD-10-CM | POA: Diagnosis not present

## 2022-08-29 DIAGNOSIS — E1122 Type 2 diabetes mellitus with diabetic chronic kidney disease: Secondary | ICD-10-CM

## 2022-08-29 DIAGNOSIS — I10 Essential (primary) hypertension: Secondary | ICD-10-CM | POA: Diagnosis not present

## 2022-08-29 DIAGNOSIS — N184 Chronic kidney disease, stage 4 (severe): Secondary | ICD-10-CM | POA: Diagnosis not present

## 2022-08-29 DIAGNOSIS — Z794 Long term (current) use of insulin: Secondary | ICD-10-CM | POA: Diagnosis not present

## 2022-08-29 NOTE — Patient Instructions (Signed)
Follow-up end of January as before, call if you need me sooner.  You are referred to nephrology it is vital that you do go so that the kidney specialist helps to manage your kidneys you do have chronic kidney disease.( please see if you can get an appt for her before she leaves)   I have removed spironolactone from your medication list since you are not taking it. '  It is important for you to take carvedilol 1-1/2 tablets 2 times daily as prescribed by cardiology.  That is a total of 3 tablets each day now you are taking 2 per day  you report, so need to increase dose and this will improve your blood pressure    A diet rich in vegetables especially will improve your blood sugar and your blood pressure as well as help to regulate your bowel, so keep working this as you are already doing.  Eye exam is past due please have this done if you have not had it for the year if you have we will send for the report.  Shingles vaccine and the current COVID vaccines are both at the pharmacy and both are recommended for you.  Best foot 2024. Thanks for choosing [facility], we consider it a privelige to serve you.

## 2022-09-02 ENCOUNTER — Encounter: Payer: Self-pay | Admitting: Family Medicine

## 2022-09-02 NOTE — Progress Notes (Signed)
Emily Jennings     MRN: [id number]      DOB: December 01, 1933   HPI Emily Jennings is here for follow up and re-evaluation of chronic medical conditions, medication management and review of any available recent lab and radiology data.  Preventive health is updated, specifically  Cancer screening and Immunization.   Questions or concerns regarding consultations or procedures which the PT has had in the interim are  addressed. The PT denies any adverse reactions to current medications since the last visit.  There are no new concerns.  There are no specific complaints   ROS Denies recent fever or chills. Denies sinus pressure, nasal congestion, ear pain or sore throat. Denies chest congestion, productive cough or wheezing. Denies chest pains, palpitations and leg swelling Denies abdominal pain, nausea, vomiting,diarrhea or constipation.   Denies dysuria, frequency, hesitancy or incontinence. Denies joint pain, swelling and limitation in mobility. Denies headaches, seizures, numbness, or tingling. Denies depression, anxiety or insomnia. Denies skin break down or rash.   PE  BP 138/60   Pulse 69   Ht '5\' 2"'$  (1.575 m)   Wt 130 lb 1.9 oz (59 kg)   SpO2 98%   BMI 23.80 kg/m   Patient alert and oriented and in no cardiopulmonary distress.  HEENT: No facial asymmetry, EOMI,     Neck supple .  Chest: Clear to auscultation bilaterally.  CVS: S1, S2 no murmurs, no S3.Regular rate.  ABD: Soft non tender.   Ext: No edema  MS: Adequate ROM spine, shoulders, hips and knees.  Skin: Intact, no ulcerations or rash noted.  Psych: Good eye contact, normal affect. Memory intact not anxious or depressed appearing.  CNS: CN 2-12 intact, power,  normal throughout.no focal deficits noted.   Assessment & Plan  Essential hypertension Uncontrolled DASH diet and commitment to daily physical activity for a minimum of 30 minutes discussed and encouraged, as a part of hypertension management. The  importance of attaining a healthy weight is also discussed.     08/29/2022   10:19 AM 08/29/2022    9:23 AM 08/29/2022    9:19 AM 07/20/2022    2:08 PM 07/20/2022    1:38 PM 07/06/2022    1:03 PM 06/28/2022    2:06 PM  BP/Weight  Systolic BP 106 269 485 462 703 500 938  Diastolic BP 60 60 60 65 64 58 70  Wt. (Lbs)   130.12  127.8 126.8   BMI   23.8 kg/m2  23.37 kg/m2 24.76 kg/m2        Type 2 diabetes mellitus with stage 3a chronic kidney disease, with long-term current use of insulin ([place]) Emily Jennings is reminded of the importance of commitment to daily physical activity for 30 minutes or more, as able and the need to limit carbohydrate intake to 30 to 60 grams per meal to help with blood sugar control.   The need to take medication as prescribed, test blood sugar as directed, and to call between visits if there is a concern that blood sugar is uncontrolled is also discussed.   Emily Jennings is reminded of the importance of daily foot exam, annual eye examination, and good blood sugar, blood pressure and cholesterol control.     Latest Ref Rng & Units 07/06/2022    1:16 PM 06/28/2022    2:24 PM 04/14/2022   10:03 AM 04/08/2022    4:12 PM 04/07/2022    4:44 PM  Diabetic Labs  HbA1c 0.0 - 7.0 % 7.4  Creatinine 0.57 - 1.00 mg/dL  1.90  1.54  1.86  2.33       08/29/2022   10:19 AM 08/29/2022    9:23 AM 08/29/2022    9:19 AM 07/20/2022    2:08 PM 07/20/2022    1:38 PM 07/06/2022    1:03 PM 06/28/2022    2:06 PM  BP/Weight  Systolic BP 470 962 836 629 476 546 503  Diastolic BP 60 60 60 65 64 58 70  Wt. (Lbs)   130.12  127.8 126.8   BMI   23.8 kg/m2  23.37 kg/m2 24.76 kg/m2       Latest Ref Rng & Units 11/10/2020    1:18 PM 11/10/2020   12:00 AM  Foot/eye exam completion dates  Eye Exam No Retinopathy No Retinopathy     No Retinopathy         This result is from an external source.      Managed, by Endo,  adequate control  Mixed hyperlipidemia Hyperlipidemia:Low fat  diet discussed and encouraged.   Lipid Panel  Lab Results  Component Value Date   CHOL 192 11/24/2021   HDL 118 11/24/2021   LDLCALC 65 11/24/2021   TRIG 43 11/24/2021   CHOLHDL 1.6 11/24/2021

## 2022-09-02 NOTE — Assessment & Plan Note (Addendum)
Uncontrolled DASH diet and commitment to daily physical activity for a minimum of 30 minutes discussed and encouraged, as a part of hypertension management. The importance of attaining a healthy weight is also discussed.     08/29/2022   10:19 AM 08/29/2022    9:23 AM 08/29/2022    9:19 AM 07/20/2022    2:08 PM 07/20/2022    1:38 PM 07/06/2022    1:03 PM 06/28/2022    2:06 PM  BP/Weight  Systolic BP 909 030 149 969 249 324 199  Diastolic BP 60 60 60 65 64 58 70  Wt. (Lbs)   130.12  127.8 126.8   BMI   23.8 kg/m2  23.37 kg/m2 24.76 kg/m2

## 2022-09-02 NOTE — Assessment & Plan Note (Signed)
Emily Jennings is reminded of the importance of commitment to daily physical activity for 30 minutes or more, as able and the need to limit carbohydrate intake to 30 to 60 grams per meal to help with blood sugar control.   The need to take medication as prescribed, test blood sugar as directed, and to call between visits if there is a concern that blood sugar is uncontrolled is also discussed.   Emily Jennings is reminded of the importance of daily foot exam, annual eye examination, and good blood sugar, blood pressure and cholesterol control.     Latest Ref Rng & Units 07/06/2022    1:16 PM 06/28/2022    2:24 PM 04/14/2022   10:03 AM 04/08/2022    4:12 PM 04/07/2022    4:44 PM  Diabetic Labs  HbA1c 0.0 - 7.0 % 7.4       Creatinine 0.57 - 1.00 mg/dL  1.90  1.54  1.86  2.33       08/29/2022   10:19 AM 08/29/2022    9:23 AM 08/29/2022    9:19 AM 07/20/2022    2:08 PM 07/20/2022    1:38 PM 07/06/2022    1:03 PM 06/28/2022    2:06 PM  BP/Weight  Systolic BP 916 384 665 993 570 177 939  Diastolic BP 60 60 60 65 64 58 70  Wt. (Lbs)   130.12  127.8 126.8   BMI   23.8 kg/m2  23.37 kg/m2 24.76 kg/m2       Latest Ref Rng & Units 11/10/2020    1:18 PM 11/10/2020   12:00 AM  Foot/eye exam completion dates  Eye Exam No Retinopathy No Retinopathy     No Retinopathy         This result is from an external source.      Managed, by Endo,  adequate control

## 2022-09-02 NOTE — Assessment & Plan Note (Signed)
Hyperlipidemia:Low fat diet discussed and encouraged.   Lipid Panel  Lab Results  Component Value Date   CHOL 192 11/24/2021   HDL 118 11/24/2021   LDLCALC 65 11/24/2021   TRIG 43 11/24/2021   CHOLHDL 1.6 11/24/2021

## 2022-09-05 ENCOUNTER — Ambulatory Visit (INDEPENDENT_AMBULATORY_CARE_PROVIDER_SITE_OTHER): Payer: Medicare Other | Admitting: Internal Medicine

## 2022-09-05 ENCOUNTER — Encounter (HOSPITAL_COMMUNITY): Payer: Self-pay

## 2022-09-05 ENCOUNTER — Other Ambulatory Visit: Payer: Self-pay

## 2022-09-05 ENCOUNTER — Emergency Department (HOSPITAL_COMMUNITY): Payer: Medicare Other

## 2022-09-05 ENCOUNTER — Encounter: Payer: Self-pay | Admitting: Internal Medicine

## 2022-09-05 ENCOUNTER — Observation Stay (HOSPITAL_COMMUNITY)
Admission: EM | Admit: 2022-09-05 | Discharge: 2022-09-06 | Disposition: A | Discharge status: Home / Self Care | Payer: Medicare Other | Attending: Family Medicine | Admitting: Family Medicine

## 2022-09-05 VITALS — BP 166/70 | HR 74 | Ht 60.0 in | Wt 141.0 lb

## 2022-09-05 DIAGNOSIS — I509 Heart failure, unspecified: Secondary | ICD-10-CM | POA: Diagnosis not present

## 2022-09-05 DIAGNOSIS — Z1152 Encounter for screening for COVID-19: Secondary | ICD-10-CM | POA: Insufficient documentation

## 2022-09-05 DIAGNOSIS — E876 Hypokalemia: Secondary | ICD-10-CM | POA: Diagnosis not present

## 2022-09-05 DIAGNOSIS — R0609 Other forms of dyspnea: Secondary | ICD-10-CM | POA: Insufficient documentation

## 2022-09-05 DIAGNOSIS — R0602 Shortness of breath: Secondary | ICD-10-CM | POA: Diagnosis not present

## 2022-09-05 DIAGNOSIS — Z853 Personal history of malignant neoplasm of breast: Secondary | ICD-10-CM | POA: Diagnosis not present

## 2022-09-05 DIAGNOSIS — R011 Cardiac murmur, unspecified: Secondary | ICD-10-CM

## 2022-09-05 DIAGNOSIS — Z79899 Other long term (current) drug therapy: Secondary | ICD-10-CM | POA: Diagnosis not present

## 2022-09-05 DIAGNOSIS — I10 Essential (primary) hypertension: Secondary | ICD-10-CM | POA: Diagnosis present

## 2022-09-05 DIAGNOSIS — I2510 Atherosclerotic heart disease of native coronary artery without angina pectoris: Secondary | ICD-10-CM | POA: Diagnosis not present

## 2022-09-05 DIAGNOSIS — K219 Gastro-esophageal reflux disease without esophagitis: Secondary | ICD-10-CM | POA: Diagnosis present

## 2022-09-05 DIAGNOSIS — Z794 Long term (current) use of insulin: Secondary | ICD-10-CM | POA: Diagnosis not present

## 2022-09-05 DIAGNOSIS — I5033 Acute on chronic diastolic (congestive) heart failure: Principal | ICD-10-CM | POA: Insufficient documentation

## 2022-09-05 DIAGNOSIS — N1832 Chronic kidney disease, stage 3b: Secondary | ICD-10-CM | POA: Diagnosis not present

## 2022-09-05 DIAGNOSIS — R600 Localized edema: Secondary | ICD-10-CM

## 2022-09-05 DIAGNOSIS — R2689 Other abnormalities of gait and mobility: Secondary | ICD-10-CM | POA: Insufficient documentation

## 2022-09-05 DIAGNOSIS — E119 Type 2 diabetes mellitus without complications: Secondary | ICD-10-CM

## 2022-09-05 DIAGNOSIS — Z139 Encounter for screening, unspecified: Secondary | ICD-10-CM | POA: Diagnosis not present

## 2022-09-05 DIAGNOSIS — I1A0 Resistant hypertension: Secondary | ICD-10-CM | POA: Diagnosis present

## 2022-09-05 DIAGNOSIS — E875 Hyperkalemia: Secondary | ICD-10-CM | POA: Diagnosis not present

## 2022-09-05 DIAGNOSIS — I110 Hypertensive heart disease with heart failure: Secondary | ICD-10-CM | POA: Diagnosis not present

## 2022-09-05 DIAGNOSIS — I130 Hypertensive heart and chronic kidney disease with heart failure and stage 1 through stage 4 chronic kidney disease, or unspecified chronic kidney disease: Secondary | ICD-10-CM | POA: Insufficient documentation

## 2022-09-05 DIAGNOSIS — E1122 Type 2 diabetes mellitus with diabetic chronic kidney disease: Secondary | ICD-10-CM | POA: Insufficient documentation

## 2022-09-05 DIAGNOSIS — J90 Pleural effusion, not elsewhere classified: Secondary | ICD-10-CM | POA: Diagnosis not present

## 2022-09-05 DIAGNOSIS — Z7982 Long term (current) use of aspirin: Secondary | ICD-10-CM | POA: Diagnosis not present

## 2022-09-05 LAB — CBC
HCT: 32 % — ABNORMAL LOW (ref 36.0–46.0)
Hemoglobin: 10.3 g/dL — ABNORMAL LOW (ref 12.0–15.0)
MCH: 30.4 pg (ref 26.0–34.0)
MCHC: 32.2 g/dL (ref 30.0–36.0)
MCV: 94.4 fL (ref 80.0–100.0)
Platelets: 205 10*3/uL (ref 150–400)
RBC: 3.39 MIL/uL — ABNORMAL LOW (ref 3.87–5.11)
RDW: 13.1 % (ref 11.5–15.5)
WBC: 5.9 10*3/uL (ref 4.0–10.5)
nRBC: 0 % (ref 0.0–0.2)

## 2022-09-05 LAB — URINALYSIS, ROUTINE W REFLEX MICROSCOPIC
Bilirubin Urine: NEGATIVE
Glucose, UA: 50 mg/dL — AB
Hgb urine dipstick: NEGATIVE
Ketones, ur: NEGATIVE mg/dL
Nitrite: NEGATIVE
Protein, ur: >=300 mg/dL — AB
Specific Gravity, Urine: 1.004 — ABNORMAL LOW (ref 1.005–1.030)
pH: 6 (ref 5.0–8.0)

## 2022-09-05 LAB — BASIC METABOLIC PANEL
Anion gap: 7 (ref 5–15)
BUN: 22 mg/dL (ref 8–23)
CO2: 29 mmol/L (ref 22–32)
Calcium: 8.8 mg/dL — ABNORMAL LOW (ref 8.9–10.3)
Chloride: 101 mmol/L (ref 98–111)
Creatinine, Ser: 1.55 mg/dL — ABNORMAL HIGH (ref 0.44–1.00)
GFR, Estimated: 32 mL/min — ABNORMAL LOW (ref >60)
Glucose, Bld: 131 mg/dL — ABNORMAL HIGH (ref 70–99)
Potassium: 3.5 mmol/L (ref 3.5–5.1)
Sodium: 137 mmol/L (ref 135–145)

## 2022-09-05 LAB — TROPONIN I (HIGH SENSITIVITY)
Troponin I (High Sensitivity): 8 ng/L (ref <18)
Troponin I (High Sensitivity): 8 ng/L (ref <18)

## 2022-09-05 LAB — RESP PANEL BY RT-PCR (RSV, FLU A&B, COVID)  RVPGX2
Influenza A by PCR: NEGATIVE
Influenza B by PCR: NEGATIVE
Resp Syncytial Virus by PCR: NEGATIVE
SARS Coronavirus 2 by RT PCR: NEGATIVE

## 2022-09-05 LAB — TSH: TSH: 0.678 u[IU]/mL (ref 0.350–4.500)

## 2022-09-05 LAB — GLUCOSE, CAPILLARY: Glucose-Capillary: 109 mg/dL — ABNORMAL HIGH (ref 70–99)

## 2022-09-05 LAB — MAGNESIUM: Magnesium: 2.3 mg/dL (ref 1.7–2.4)

## 2022-09-05 LAB — BRAIN NATRIURETIC PEPTIDE: B Natriuretic Peptide: 848 pg/mL — ABNORMAL HIGH (ref 0.0–100.0)

## 2022-09-05 MED ORDER — FUROSEMIDE 10 MG/ML IJ SOLN
40.0000 mg | Freq: Two times a day (BID) | INTRAMUSCULAR | Status: DC
  Administered 2022-09-06: 40 mg via INTRAVENOUS
  Filled 2022-09-05: qty 4

## 2022-09-05 MED ORDER — AMLODIPINE-OLMESARTAN 10-40 MG PO TABS: 1.0000 | ORAL_TABLET | Freq: Every day | ORAL | Status: DC

## 2022-09-05 MED ORDER — HEPARIN SODIUM (PORCINE) 5000 UNIT/ML IJ SOLN
5000.0000 [IU] | Freq: Three times a day (TID) | INTRAMUSCULAR | Status: DC
  Administered 2022-09-05 – 2022-09-06 (×2): 5000 [IU] via SUBCUTANEOUS
  Filled 2022-09-05 (×2): qty 1

## 2022-09-05 MED ORDER — ROSUVASTATIN CALCIUM 10 MG PO TABS
5.0000 mg | ORAL_TABLET | Freq: Every day | ORAL | Status: DC
  Administered 2022-09-06: 5 mg via ORAL
  Filled 2022-09-05: qty 1

## 2022-09-05 MED ORDER — SODIUM CHLORIDE 0.9% FLUSH
3.0000 mL | INTRAVENOUS | Status: DC | PRN
  Administered 2022-09-05: 3 mL via INTRAVENOUS

## 2022-09-05 MED ORDER — FUROSEMIDE 40 MG PO TABS: 40.0000 mg | ORAL_TABLET | Freq: Two times a day (BID) | ORAL | 0 refills | Status: DC

## 2022-09-05 MED ORDER — ASPIRIN 81 MG PO TBEC
81.0000 mg | DELAYED_RELEASE_TABLET | Freq: Every day | ORAL | Status: DC
  Administered 2022-09-05 – 2022-09-06 (×2): 81 mg via ORAL
  Filled 2022-09-05 (×2): qty 1

## 2022-09-05 MED ORDER — FUROSEMIDE 10 MG/ML IJ SOLN
40.0000 mg | Freq: Once | INTRAMUSCULAR | Status: AC
  Administered 2022-09-05: 40 mg via INTRAVENOUS
  Filled 2022-09-05: qty 4

## 2022-09-05 MED ORDER — SODIUM CHLORIDE 0.9% FLUSH
3.0000 mL | Freq: Two times a day (BID) | INTRAVENOUS | Status: DC
  Administered 2022-09-06: 3 mL via INTRAVENOUS

## 2022-09-05 MED ORDER — SODIUM CHLORIDE 0.9 % IV SOLN: 250.0000 mL | INTRAVENOUS | Status: DC | PRN

## 2022-09-05 MED ORDER — IRBESARTAN 150 MG PO TABS
300.0000 mg | ORAL_TABLET | Freq: Every day | ORAL | Status: DC
  Administered 2022-09-05 – 2022-09-06 (×2): 300 mg via ORAL
  Filled 2022-09-05 (×2): qty 2

## 2022-09-05 MED ORDER — ONDANSETRON HCL 4 MG/2ML IJ SOLN: 4.0000 mg | Freq: Four times a day (QID) | INTRAMUSCULAR | Status: DC | PRN

## 2022-09-05 MED ORDER — AMLODIPINE BESYLATE 5 MG PO TABS
10.0000 mg | ORAL_TABLET | Freq: Every day | ORAL | Status: DC
  Administered 2022-09-05 – 2022-09-06 (×2): 10 mg via ORAL
  Filled 2022-09-05 (×2): qty 2

## 2022-09-05 MED ORDER — ACETAMINOPHEN 325 MG PO TABS: 650.0000 mg | ORAL_TABLET | ORAL | Status: DC | PRN

## 2022-09-05 MED ORDER — POTASSIUM CHLORIDE CRYS ER 20 MEQ PO TBCR
40.0000 meq | EXTENDED_RELEASE_TABLET | Freq: Once | ORAL | Status: AC
  Administered 2022-09-05: 40 meq via ORAL
  Filled 2022-09-05: qty 2

## 2022-09-05 MED ORDER — INSULIN ASPART 100 UNIT/ML IJ SOLN
0.0000 [IU] | Freq: Three times a day (TID) | INTRAMUSCULAR | Status: DC
  Administered 2022-09-06: 8 [IU] via SUBCUTANEOUS
  Administered 2022-09-06: 3 [IU] via SUBCUTANEOUS

## 2022-09-05 NOTE — H&P (Signed)
History and Physical    Emily Jennings ACZ:[id number] DOB: Oct 20, 1933 DOA: 09/05/2022  PCP: [name], MD  Patient coming from: Home  I have personally briefly reviewed patient's old medical records in [place]  Chief Complaint: Shortness of breath  HPI: Emily Jennings is a 88 y.o. female with medical history significant of hypertension, type 2 diabetes mellitus, mild aortic stenosis, CKD stage IIIb, GERD presented to ED with shortness of breath.  Patient has been having shortness of breath for last couple of weeks.  She was initially referred by her PCP to cardiology for heart murmur, she underwent transthoracic echo and was found to have normal ejection fraction with indeterminant diastolic dysfunction and mild aortic stenosis.  Patient continued to have worsening shortness of breath with orthopnea so she went to see her PCP today.  Per PCP note, patient was found to have edema which was new over the course of last 3 weeks and she also was found to have some crackles at the bases so CHF was suspected and thus her PCP sent her to the emergency department.  Other than exertional shortness of breath and orthopnea, patient has no complaints.  Patient's husband is at the bedside as well.  ED Course: Upon arrival to ED, patient's blood pressure was slightly elevated otherwise she was saturating 96% on room air.  Chest x-ray shows vascular congestion.  BNP still is pending.  Rest of the labs are normal.  Patient is ordered 1 dose of IV Lasix and hospitalist service were consulted for admission.  Review of Systems: As per HPI otherwise negative.    Past Medical History:  Diagnosis Date   Allergy    Anxiety    ANXIETY DISORDER, GENERALIZED 07/15/2007   Qualifier: Diagnosis of  By: Rennie Plowman     Arthritis    Cancer [facility]) 2009   breast, carcinoma in situ left   Carcinoma in situ of breast 05/21/2008   Qualifier: Diagnosis of  By: Moshe Cipro MD, Margaret  Diagnosed in 2009,  completed 5 year course of tamoxifen, no evidence of recurrence    Carotid stenosis    11/16/2005  mild plaque formation and stenosis proximal right ECA   Cataract    Complication of anesthesia    Coronary artery disease    cardiac catheterization on 03/20/2006  LAD mid 40% stenosis, left circumflex mild 40% stenosis, RCA mid-vessel 40% to 50% lesion   EF 60%   Diabetes mellitus    GERD (gastroesophageal reflux disease)    Hernia, inguinal    left   Hyperglycemia    Hypertension    Insomnia 11/16/2011   Low blood potassium    Non-insulin dependent type 2 diabetes mellitus ([place])    Osteoporosis    Shortness of breath    2D Echocardiogram 01/26/2009   EF of greater than 55%, mild MR, mild TR, normal ventricular function   Thickened endometrium 10/26/2017   Noted by gyne in 2017, missed 6 month follow up, referred in 09/2017   Ventricular tachycardia, non-sustained ([place])    developed during stress test 02/08/2006, spontaneously aborted, mild reversible apical defect    Past Surgical History:  Procedure Laterality Date   BREAST LUMPECTOMY Left 2009   Left breast 2009   CATARACT EXTRACTION W/PHACO Left 10/28/2014   Procedure: PHACO EMULSION CATARACT EXTRACTION WITH INTRAOCULAR LENS IMPLANT LEFT EYE ([place]);  Surgeon: Marylynn Pearson, MD;  Location: [facility];  Service: Ophthalmology;  Laterality: Left;   COLONOSCOPY  cyst removed from left foot     REFRACTIVE SURGERY Left      reports that she has never smoked. She has never been exposed to tobacco smoke. She has never used smokeless tobacco. She reports that she does not drink alcohol and does not use drugs.  Allergies  Allergen Reactions   Benadryl [Diphenhydramine Hcl] Hypertension   Citalopram Other (See Comments)    unknown   Metformin And Related Diarrhea    Lost appetite and weight    Tramadol Other (See Comments)    Felt light headed and dizzy   Crestor [Rosuvastatin] Other (See Comments)    "Feet swelling", makes her feel weak     Family History  Problem Relation Age of Onset   Hypertension Mother    Hyperlipidemia Mother    Stroke Mother    Urticaria Mother    Cancer Father        pancreatic   Colon cancer Father    Heart disease Brother 38       bypass   Heart disease Brother 59       bypass   Arthritis Other    Asthma Other    Diabetes Other    Colon cancer Paternal Aunt    Esophageal cancer Neg Hx    Stomach cancer Neg Hx    Rectal cancer Neg Hx     Prior to Admission medications   Medication Sig Start Date End Date Taking? Authorizing Provider  acetaminophen (TYLENOL) 500 MG tablet Take one tablet two times daily for 3 days, then as needed 08/27/18   [name], MD  alendronate (FOSAMAX) 70 MG tablet TAKE 1 TABLET BY MOUTH EVERY 7 DAYS. TAKE WITH A FULL GLASS OF WATER ON AN EMPTY STOMACH Patient taking differently: Take 70 mg by mouth once a week. TAKE 1 TABLET BY MOUTH EVERY 7 DAYS. TAKE WITH A FULL GLASS OF WATER ON AN EMPTY STOMACH 01/04/22   [name], MD  amLODipine-olmesartan (AZOR) 10-40 MG tablet TAKE 1 TABLET BY MOUTH EVERY DAY 07/20/22   [name], MD  aspirin EC 81 MG tablet Take 81 mg by mouth daily.    [provider]  blood glucose meter kit and supplies KIT One touch Ultra. Use three times daily as directed. (FOR ICD E11.65) 10/27/20   Cassandria Anger, MD  carvedilol (COREG) 12.5 MG tablet Take 1.5 tablets (18.75 mg total) by mouth in the morning and at bedtime. 07/20/22   Arnoldo Lenis, MD  cholecalciferol (VITAMIN D3) 25 MCG (1000 UT) tablet Take 2,000 Units by mouth daily.    [provider]  dicyclomine (BENTYL) 10 MG capsule Take 1 capsule (10 mg total) by mouth 3 (three) times daily before meals. 12/21/21   Ladene Artist, MD  furosemide (LASIX) 40 MG tablet Take 1 tablet (40 mg total) by mouth 2 (two) times daily for 5 days. 09/05/22 09/10/22  Johnette Abraham, MD  glucose blood (ONETOUCH ULTRA) test strip USE TO TEST 2  TIMES A DAY 07/17/22   Cassandria Anger, MD  insulin lispro protamine-lispro (HUMALOG MIX 75/25) (75-25) 100 UNIT/ML SUSP injection 15 units bid if BG above 90 05/24/22   [name], [name], MD  Insulin Pen Needle 32G X 4 MM MISC 1 each by Does not apply route as directed. 01/22/20   Cassandria Anger, MD  Insulin Syringe-Needle U-100 (BD INSULIN SYRINGE U/F) 31G X 5/16" 0.5 ML MISC USE AS DIRECTED TWICE DAILY 01/26/20  Cassandria Anger, MD  Multiple Vitamins-Minerals (CENTRUM SILVER 50+WOMEN) TABS Take 1 tablet by mouth daily.    [provider]  mupirocin ointment (BACTROBAN) 2 % Apply topically 2 (two) times daily. 11/23/21   [name], MD  OneTouch Delica Lancets 50D MISC 1 each by Does not apply route 3 (three) times daily. Use to check glucose three times daily 12/06/20   Brita Romp, NP  RESTASIS 0.05 % ophthalmic emulsion Place 1 drop into both eyes every evening. 06/10/20   [provider]  rosuvastatin (CRESTOR) 5 MG tablet TAKE 1 TABLET (5 MG TOTAL) BY MOUTH DAILY. Patient taking differently: Take 5 mg by mouth daily. 01/19/22   [name], MD  spironolactone (ALDACTONE) 25 MG tablet Take 1 tablet (25 mg total) by mouth daily. 07/20/22 07/15/23  Arnoldo Lenis, MD    Physical Exam: Vitals:   09/05/22 1450 09/05/22 1451 09/05/22 1754  BP: (!) 180/71  (!) 169/71  Pulse: 71  78  Resp: (!) 22  17  Temp: 98 F (36.7 C)    TempSrc: Oral    SpO2: 96%  95%  Weight:  64 kg   Height:  5' (1.524 m)     Constitutional: NAD, calm, comfortable Vitals:   09/05/22 1450 09/05/22 1451 09/05/22 1754  BP: (!) 180/71  (!) 169/71  Pulse: 71  78  Resp: (!) 22  17  Temp: 98 F (36.7 C)    TempSrc: Oral    SpO2: 96%  95%  Weight:  64 kg   Height:  5' (1.524 m)    Eyes: PERRL, lids and conjunctivae normal ENMT: Mucous membranes are moist. Posterior pharynx clear of any exudate or lesions.Normal dentition.  Neck: normal, supple, no  masses, no thyromegaly Respiratory: clear to auscultation bilaterally, no wheezing, no crackles. Normal respiratory effort. No accessory muscle use.  Cardiovascular: Regular rate and rhythm, no murmurs / rubs / gallops. No extremity edema. 2+ pedal pulses. No carotid bruits.  Abdomen: no tenderness, no masses palpated. No hepatosplenomegaly. Bowel sounds positive.  Musculoskeletal: no clubbing / cyanosis. No joint deformity upper and lower extremities. Good ROM, no contractures. Normal muscle tone.  Skin: no rashes, lesions, ulcers. No induration Neurologic: CN 2-12 grossly intact. Sensation intact, DTR normal. Strength 5/5 in all 4.  Psychiatric: Normal judgment and insight. Alert and oriented x 3. Normal mood.    Labs on Admission: I have personally reviewed following labs and imaging studies  CBC: Recent Labs  Lab 09/05/22 1500  WBC 5.9  HGB 10.3*  HCT 32.0*  MCV 94.4  PLT 326   Basic Metabolic Panel: Recent Labs  Lab 09/05/22 1500 09/05/22 1727  NA 137  --   K 3.5  --   CL 101  --   CO2 29  --   GLUCOSE 131*  --   BUN 22  --   CREATININE 1.55*  --   CALCIUM 8.8*  --   MG  --  2.3   GFR: Estimated Creatinine Clearance: 21.8 mL/min (A) (by C-G formula based on SCr of 1.55 mg/dL (H)). Liver Function Tests: No results for input(s): "AST", "ALT", "ALKPHOS", "BILITOT", "PROT", "ALBUMIN" in the last 168 hours. No results for input(s): "LIPASE", "AMYLASE" in the last 168 hours. No results for input(s): "AMMONIA" in the last 168 hours. Coagulation Profile: No results for input(s): "INR", "PROTIME" in the last 168 hours. Cardiac Enzymes: No results for input(s): "CKTOTAL", "CKMB", "CKMBINDEX", "TROPONINI" in the last 168 hours.  BNP (last 3 results) No results for input(s): "PROBNP" in the last 8760 hours. HbA1C: No results for input(s): "HGBA1C" in the last 72 hours. CBG: No results for input(s): "GLUCAP" in the last 168 hours. Lipid Profile: No results for input(s):  "CHOL", "HDL", "LDLCALC", "TRIG", "CHOLHDL", "LDLDIRECT" in the last 72 hours. Thyroid Function Tests: No results for input(s): "TSH", "T4TOTAL", "FREET4", "T3FREE", "THYROIDAB" in the last 72 hours. Anemia Panel: No results for input(s): "VITAMINB12", "FOLATE", "FERRITIN", "TIBC", "IRON", "RETICCTPCT" in the last 72 hours. Urine analysis:    Component Value Date/Time   COLORURINE STRAW (A) 09/05/2022 1712   APPEARANCEUR CLEAR 09/05/2022 1712   LABSPEC 1.004 (L) 09/05/2022 1712   PHURINE 6.0 09/05/2022 1712   GLUCOSEU 50 (A) 09/05/2022 1712   HGBUR NEGATIVE 09/05/2022 1712   HGBUR trace-intact 04/28/2010 1259   BILIRUBINUR NEGATIVE 09/05/2022 1712   BILIRUBINUR neg 09/29/2013 1341   KETONESUR NEGATIVE 09/05/2022 1712   PROTEINUR >=300 (A) 09/05/2022 1712   UROBILINOGEN 0.2 09/29/2013 1341   UROBILINOGEN 0.2 12/28/2010 1743   NITRITE NEGATIVE 09/05/2022 1712   LEUKOCYTESUR TRACE (A) 09/05/2022 1712    Radiological Exams on Admission: DG Chest 2 View  Result Date: 09/05/2022 CLINICAL DATA:  Increased shortness of breath, dyspnea on exertion EXAM: CHEST - 2 VIEW COMPARISON:  03/22/2022 FINDINGS: Frontal and lateral views of the chest demonstrate an enlarged cardiac silhouette. There is increased central vascular congestion, with diffuse increased interstitial prominence. No evidence of airspace disease. Trace right pleural effusion. No pneumothorax. No acute bony abnormalities. IMPRESSION: 1. Findings most consistent with congestive heart failure and developing interstitial edema. Electronically Signed   By: Randa Ngo M.D.   On: 09/05/2022 15:19    EKG: Independently reviewed.  Normal sinus rhythm with no acute ST-T wave changes.  Assessment/Plan Principal Problem:   CHF (congestive heart failure) (HCC) Active Problems:   Essential hypertension   Stage 3b chronic kidney disease (HCC)   Type 2 diabetes mellitus (HCC)   GERD (gastroesophageal reflux disease)   Acute on  chronic diastolic (congestive) heart failure (HCC)   Acute on chronic congestive heart failure with preserved ejection fraction: Echo done just 3 weeks ago, no need to repeat echo.  Has very faint crackles at the bases and chest x-ray consistent with vascular congestion as well.  1 dose of IV Lasix 40 mg is ordered by ED, I will resume at 40 mg IV twice daily starting tomorrow and we will reassess tomorrow.  BNP is pending.  Low-sodium diet.  I will hold beta-blocker due to acute CHF.  Essential hypertension: Resume home dose of amlodipine and olmesartan.  Holding beta-blocker for the reasons mentioned above.  Hyperlipidemia: Resume Crestor.  Type 2 diabetes mellitus: Patient appears to be taking 75 over 2515 units twice daily as needed.  Her recent hemoglobin A1c was only 7.2.  I will recheck hemoglobin A1c and at the moment only start on SSI.  Will initiate and adjust long-acting insulin accordingly.  CKD stage IIIb: At baseline.  DVT prophylaxis: heparin injection 5,000 Units Start: 09/05/22 2200 Code Status: Full code Family Communication: Husband present at bedside.  Plan of care discussed with patient in length and he verbalized understanding and agreed with it. Disposition Plan: Likely home tomorrow Consults called: None  Darliss Cheney MD [facility]  *Please note that this is a verbal dictation therefore any spelling or grammatical errors are due to the "[facility]" system interpretation.  Please page via [place] and do not message via  secure chat for urgent patient care matters. Secure chat can be used for non urgent patient care matters. 09/05/2022, 6:39 PM  To contact the attending provider between 7A-7P or the covering provider during after hours 7P-7A, please log into the web site [email or web address]

## 2022-09-05 NOTE — ED Provider Notes (Signed)
[facility] EMERGENCY DEPARTMENT Provider Note   CSN: [id number] Arrival date & time: 09/05/22  1440     History  Chief Complaint  Patient presents with   Shortness of Breath    Emily Jennings is a 88 y.o. female who presents emergency department with concerns for shortness of breath onset several weeks.  Patient was informed by her primary care provider to come into the ED for likely admission.  Notes that her shortness of breath is worse with exertion.  Denies past medical history of CHF.  Has associated leg swelling that has been ongoing for several weeks.  Patient denies chest pain.    Per patient chart review: Patient was evaluated by her primary care provider today and instructed to come into the ED for admission for likely CHF exacerbation.  Patient was also sent a prescription for Lasix to her pharmacy.  The history is provided by the patient. No language interpreter was used.       Home Medications Prior to Admission medications   Medication Sig Start Date End Date Taking? Authorizing Provider  acetaminophen (TYLENOL) 500 MG tablet Take one tablet two times daily for 3 days, then as needed 08/27/18   [name], MD  alendronate (FOSAMAX) 70 MG tablet TAKE 1 TABLET BY MOUTH EVERY 7 DAYS. TAKE WITH A FULL GLASS OF WATER ON AN EMPTY STOMACH Patient taking differently: Take 70 mg by mouth once a week. TAKE 1 TABLET BY MOUTH EVERY 7 DAYS. TAKE WITH A FULL GLASS OF WATER ON AN EMPTY STOMACH 01/04/22   [name], MD  amLODipine-olmesartan (AZOR) 10-40 MG tablet TAKE 1 TABLET BY MOUTH EVERY DAY 07/20/22   [name], MD  aspirin EC 81 MG tablet Take 81 mg by mouth daily.    [provider]  blood glucose meter kit and supplies KIT One touch Ultra. Use three times daily as directed. (FOR ICD E11.65) 10/27/20   Cassandria Anger, MD  carvedilol (COREG) 12.5 MG tablet Take 1.5 tablets (18.75 mg total) by mouth in the morning and at bedtime.  07/20/22   Arnoldo Lenis, MD  cholecalciferol (VITAMIN D3) 25 MCG (1000 UT) tablet Take 2,000 Units by mouth daily.    [provider]  dicyclomine (BENTYL) 10 MG capsule Take 1 capsule (10 mg total) by mouth 3 (three) times daily before meals. 12/21/21   Ladene Artist, MD  furosemide (LASIX) 40 MG tablet Take 1 tablet (40 mg total) by mouth 2 (two) times daily for 5 days. 09/05/22 09/10/22  Johnette Abraham, MD  glucose blood (ONETOUCH ULTRA) test strip USE TO TEST 2 TIMES A DAY 07/17/22   Cassandria Anger, MD  insulin lispro protamine-lispro (HUMALOG MIX 75/25) (75-25) 100 UNIT/ML SUSP injection 15 units bid if BG above 90 05/24/22   [name], [name], MD  Insulin Pen Needle 32G X 4 MM MISC 1 each by Does not apply route as directed. 01/22/20   Cassandria Anger, MD  Insulin Syringe-Needle U-100 (BD INSULIN SYRINGE U/F) 31G X 5/16" 0.5 ML MISC USE AS DIRECTED TWICE DAILY 01/26/20   [name], [name], MD  Multiple Vitamins-Minerals (CENTRUM SILVER 50+WOMEN) TABS Take 1 tablet by mouth daily.    [provider]  mupirocin ointment (BACTROBAN) 2 % Apply topically 2 (two) times daily. 11/23/21   [name], MD  OneTouch Delica Lancets 38V MISC 1 each by Does not apply route 3 (three) times daily. Use to check glucose  three times daily 12/06/20   Brita Romp, NP  RESTASIS 0.05 % ophthalmic emulsion Place 1 drop into both eyes every evening. 06/10/20   [provider]  rosuvastatin (CRESTOR) 5 MG tablet TAKE 1 TABLET (5 MG TOTAL) BY MOUTH DAILY. Patient taking differently: Take 5 mg by mouth daily. 01/19/22   [name], MD  spironolactone (ALDACTONE) 25 MG tablet Take 1 tablet (25 mg total) by mouth daily. 07/20/22 07/15/23  Arnoldo Lenis, MD      Allergies    Benadryl [diphenhydramine hcl], Citalopram, Metformin and related, Tramadol, and Crestor [rosuvastatin]    Review of Systems   Review of Systems  Respiratory:  Positive for  shortness of breath.     Physical Exam Updated Vital Signs BP (!) 169/71 (BP Location: Left Arm)   Pulse 78   Temp 98 F (36.7 C) (Oral)   Resp 17   Ht 5' (1.524 m)   Wt 64 kg   SpO2 95%   BMI 27.54 kg/m  Physical Exam Vitals and nursing note reviewed.  Constitutional:      General: She is not in acute distress.    Appearance: She is not diaphoretic.  HENT:     Head: Normocephalic and atraumatic.     Mouth/Throat:     Pharynx: No oropharyngeal exudate.  Eyes:     General: No scleral icterus.    Conjunctiva/sclera: Conjunctivae normal.  Cardiovascular:     Rate and Rhythm: Normal rate and regular rhythm.     Pulses: Normal pulses.     Heart sounds: Normal heart sounds.  Pulmonary:     Effort: Pulmonary effort is normal. No respiratory distress.     Breath sounds: Normal breath sounds. No wheezing.  Abdominal:     General: Bowel sounds are normal.     Palpations: Abdomen is soft. There is no mass.     Tenderness: There is no abdominal tenderness. There is no guarding or rebound.  Musculoskeletal:        General: Normal range of motion.     Cervical back: Normal range of motion and neck supple.     Comments: Pitting edema noted to bilateral lower extremities without tenderness to palpation or overlying skin changes.  Skin:    General: Skin is warm and dry.  Neurological:     Mental Status: She is alert.  Psychiatric:        Behavior: Behavior normal.     ED Results / Procedures / Treatments   Labs (all labs ordered are listed, but only abnormal results are displayed) Labs Reviewed  BASIC METABOLIC PANEL - Abnormal; Notable for the following components:      Result Value   Glucose, Bld 131 (*)    Creatinine, Ser 1.55 (*)    Calcium 8.8 (*)    GFR, Estimated 32 (*)    All other components within normal limits  CBC - Abnormal; Notable for the following components:   RBC 3.39 (*)    Hemoglobin 10.3 (*)    HCT 32.0 (*)    All other components within normal  limits  URINALYSIS, ROUTINE W REFLEX MICROSCOPIC - Abnormal; Notable for the following components:   Color, Urine STRAW (*)    Specific Gravity, Urine 1.004 (*)    Glucose, UA 50 (*)    Protein, ur >=300 (*)    Leukocytes,Ua TRACE (*)    Bacteria, UA RARE (*)    All other components within normal limits  RESP PANEL  BY RT-PCR (RSV, FLU A&B, COVID)  [id number]  MAGNESIUM  BRAIN NATRIURETIC PEPTIDE  TROPONIN I (HIGH SENSITIVITY)  TROPONIN I (HIGH SENSITIVITY)    EKG EKG Interpretation  Date/Time:  Tuesday September 05 2022 14:55:36 EST Ventricular Rate:  68 PR Interval:  180 QRS Duration: 80 QT Interval:  392 QTC Calculation: 416 R Axis:   29 Text Interpretation: Normal sinus rhythm Confirmed by Godfrey Pick ([id number]) on 09/05/2022 5:51:03 PM  Radiology DG Chest 2 View  Result Date: 09/05/2022 CLINICAL DATA:  Increased shortness of breath, dyspnea on exertion EXAM: CHEST - 2 VIEW COMPARISON:  03/22/2022 FINDINGS: Frontal and lateral views of the chest demonstrate an enlarged cardiac silhouette. There is increased central vascular congestion, with diffuse increased interstitial prominence. No evidence of airspace disease. Trace right pleural effusion. No pneumothorax. No acute bony abnormalities. IMPRESSION: 1. Findings most consistent with congestive heart failure and developing interstitial edema. Electronically Signed   By: Randa Ngo M.D.   On: 09/05/2022 15:19    Procedures Procedures    Medications Ordered in ED Medications  potassium chloride SA (KLOR-CON M) CR tablet 40 mEq (has no administration in time range)  furosemide (LASIX) injection 40 mg (has no administration in time range)    ED Course/ Medical Decision Making/ A&P Clinical Course as of 09/05/22 1832  Tue Sep 05, 2022  1825 Discussed with patient and husband regarding plans for admission.  Patient has been agreeable at this time. [SB]  1827 Consult to hospitalist, Dr. Doristine Bosworth who agrees with admission at this  time. [SB]    Clinical Course User Index [SB] Wilma Wuthrich A, PA-C                           Medical Decision Making Amount and/or Complexity of Data Reviewed Labs: ordered. Radiology: ordered.  Risk Prescription drug management. Decision regarding hospitalization.   Patient presents to the ED complaining of concerns for shortness of breath onset several weeks.  Also notes leg swelling.  No history of CHF.  No antidiabetics at home.  Patient afebrile, not tachycardic or hypoxic.  Recent echocardiogram on 08/01/2022 shows LVEF of 60-65%.  On exam patient with pitting edema noted to bilateral lower extremities.  Differential diagnosis includes CHF exacerbation, PNA, COVID, flu, RSV.   Additional history obtained:  Additional history obtained from Spouse/Significant Other External records from outside source obtained and reviewed including: Patient was evaluated by her primary care provider today for similar concerns.  Her primary care provider recommended admission to the hospital however patient declined at this time and wanted to try outpatient management with Lasix.   Labs:  I ordered, and personally interpreted labs.  The pertinent results include:  BNP ordered with results pending at time of admission CBC without leukocytosis Initial troponin at 8, delta troponin ordered with results pending at time of admission. Magnesium ordered with results pending at time of admission Urinalysis unremarkable BMP with elevated creatinine 1.55, GFR decreased at 32, overall improved from previous value COVID, flu, RSV swab ordered with results pending at time of admission.  Imaging: I ordered imaging studies including CXR I independently visualized and interpreted imaging which showed  1. Findings most consistent with congestive heart failure and  developing interstitial edema.   I agree with the radiologist interpretation  Medications:  I ordered medication including Lasix, potassium  for diuresis I have reviewed the patients home medicines and have made adjustments as needed    Consultations: I  requested consultation with the Hospitalist, Dr. Doristine Bosworth and discussed lab and imaging findings as well as pertinent plan - they recommend: Recommends admission at this time.   Disposition: Presentation suspicious for CHF exacerbation.  Doubt pneumonia, COVID, flu, RSV at this time. Given CXR findings and patient appearing fluid overloaded, this seems likely as CHF exacerbation.  Case discussed with attending who agrees with admission at this time.  Patient reevaluated prior to consult and vital signs stable, in no acute distress, patient resting comfortably on stretcher. After consideration of the diagnostic results and the patients response to treatment, I feel that the patient would benefit from Admission to the hospital. Discussed admission plans with patient at bedside, patient agreeable at this time. Pt appears safe for admission.   This chart was dictated using voice recognition software, Dragon. Despite the best efforts of this provider to proofread and correct errors, errors may still occur which can change documentation meaning.  Final Clinical Impression(s) / ED Diagnoses Final diagnoses:  Acute on chronic congestive heart failure, unspecified heart failure type [facility])    Rx / DC Orders ED Discharge Orders     None         Talayah Picardi A, PA-C 09/05/22 1832    Godfrey Pick, MD 09/06/22 0110

## 2022-09-05 NOTE — ED Triage Notes (Signed)
Pt presents to ED with increased shortness of breath sent by PCP, Pt states has been getting worse over last couple weeks with exertion.

## 2022-09-05 NOTE — Progress Notes (Signed)
Acute Office Visit  Subjective:     Patient ID: Emily Jennings, female    DOB: March 09, 1934, 88 y.o.   MRN: [id number]  Chief Complaint  Patient presents with   Shortness of Breath    Seen sob was a side affect on spironolactone and has noticed its gotten worse since meds had been increased   Emily Jennings presents today for an acute visit to discuss increasing dyspnea on exertion.  She states that she has experienced symptoms of DOE over the last 6 months, however they have significantly worsened over the last 3 weeks.  She currently becomes short of breath with minimal exertion.  She describes orthopnea as well and provides an example of becoming short of breath when getting out of bed to go to the restroom at night.  Emily Jennings has previously attributed her symptoms to taking spironolactone.  She has not taken spironolactone in over 3 weeks, but her symptoms have not improved.  Of note, she has recently been evaluated by cardiology for similar symptoms.  TTE performed 11/14 demonstrated normal EF with no wall motion abnormalities.  RV systolic function appears normal as well.  She does have a mildly calcified aortic valve with a mean gradient of 14.7 mmHg.  Of note as well, she was evaluated by Dr. Moshe Cipro on 12/12.  Her weight is increased by 11 pounds since that time.  Review of Systems  Respiratory:  Positive for shortness of breath.   Cardiovascular:  Positive for orthopnea and PND. Negative for chest pain, palpitations and leg swelling.  All other systems reviewed and are negative.       Objective:    BP (!) 166/70   Pulse 74   Ht 5' (1.524 m)   Wt 141 lb (64 kg)   SpO2 92%   BMI 27.54 kg/m  BP Readings from Last 3 Encounters:  09/05/22 (!) 166/70  08/29/22 138/60  07/20/22 (!) 140/65      Physical Exam Vitals reviewed.  Constitutional:      General: She is not in acute distress.    Appearance: Normal appearance. She is not ill-appearing.  HENT:     Head: Normocephalic and  atraumatic.     Right Ear: External ear normal.     Left Ear: External ear normal.     Mouth/Throat:     Mouth: Mucous membranes are moist.     Pharynx: Oropharynx is clear. No oropharyngeal exudate or posterior oropharyngeal erythema.  Eyes:     Extraocular Movements: Extraocular movements intact.     Conjunctiva/sclera: Conjunctivae normal.     Pupils: Pupils are equal, round, and reactive to light.  Cardiovascular:     Rate and Rhythm: Normal rate and regular rhythm.     Pulses: Normal pulses.     Heart sounds: Murmur heard.  Pulmonary:     Effort: Pulmonary effort is normal.     Comments: Bibasilar crackles.  Becomes short of breath after long sentences. Abdominal:     General: Abdomen is flat. Bowel sounds are normal. There is no distension.     Palpations: Abdomen is soft.     Tenderness: There is no abdominal tenderness.  Musculoskeletal:        General: Normal range of motion.     Right lower leg: No edema.     Left lower leg: No edema.  Skin:    General: Skin is warm and dry.     Capillary Refill: Capillary refill takes less than 2  seconds.     Coloration: Skin is not jaundiced.  Neurological:     General: No focal deficit present.     Mental Status: She is alert and oriented to person, place, and time.  Psychiatric:        Mood and Affect: Mood normal.        Behavior: Behavior normal.       Assessment & Plan:   Problem List Items Addressed This Visit       Dyspnea on exertion - Primary    Chronic symptom that has significantly worsened over the last 3 weeks.  She was recently evaluated by Dr. Moshe Cipro on 12/12.  Her weight at that time was 130 lbs and her weight today is 141 lbs.  She describes becoming short of breath with minimal activity and also endorses orthopnea.  Bibasilar crackles are appreciated on exam.  She does not have pitting lower extremity edema.  Given her significant weight gain, exam today, and current symptoms I am concerned that this  represents volume overload. -I recommended that Emily Jennings present to the emergency department for evaluation given her significant weight gain and likely need for inpatient management.  She has declined to present to the emergency department today and would instead prefer to be treated in the outpatient setting. -I have prescribed Lasix 40 mg twice daily and will have her return to care before the end of the week if she chooses not to present to the emergency department. -Labs ordered today (CBC, CMP, BNP, TSH/T4, and iron studies)       Meds ordered this encounter  Medications   furosemide (LASIX) 40 MG tablet    Sig: Take 1 tablet (40 mg total) by mouth 2 (two) times daily for 5 days.    Dispense:  10 tablet    Refill:  0    Return in about 2 days (around 09/07/2022) for DOE.  Johnette Abraham, MD

## 2022-09-05 NOTE — Assessment & Plan Note (Signed)
Chronic symptom that has significantly worsened over the last 3 weeks.  She was recently evaluated by Dr. Moshe Cipro on 12/12.  Her weight at that time was 130 lbs and her weight today is 141 lbs.  She describes becoming short of breath with minimal activity and also endorses orthopnea.  Bibasilar crackles are appreciated on exam.  She does not have pitting lower extremity edema.  Given her significant weight gain, exam today, and current symptoms I am concerned that this represents volume overload. -I recommended that Ms. Guidone present to the emergency department for evaluation given her significant weight gain and likely need for inpatient management.  She has declined to present to the emergency department today and would instead prefer to be treated in the outpatient setting. -I have prescribed Lasix 40 mg twice daily and will have her return to care before the end of the week if she chooses not to present to the emergency department. -Labs ordered today (CBC, CMP, BNP, TSH/T4, and iron studies)

## 2022-09-05 NOTE — Patient Instructions (Signed)
It was a pleasure to see you today.  Thank you for giving [place] the opportunity to be involved in your care.  Below is a brief recap of your visit and next steps.  We will plan to see you again in 3 days  Summary Take lasix 40 mg twice daily for the next 3 days We will check labs today and plan for follow up before the end of the week

## 2022-09-05 NOTE — ED Notes (Addendum)
Ambulatory to restroom to have BM . Instructed to use call bell when back from restroom

## 2022-09-06 ENCOUNTER — Observation Stay (HOSPITAL_COMMUNITY): Payer: Medicare Other

## 2022-09-06 DIAGNOSIS — I509 Heart failure, unspecified: Secondary | ICD-10-CM | POA: Diagnosis not present

## 2022-09-06 DIAGNOSIS — I5033 Acute on chronic diastolic (congestive) heart failure: Secondary | ICD-10-CM | POA: Diagnosis not present

## 2022-09-06 DIAGNOSIS — N1832 Chronic kidney disease, stage 3b: Secondary | ICD-10-CM | POA: Diagnosis not present

## 2022-09-06 DIAGNOSIS — E876 Hypokalemia: Secondary | ICD-10-CM | POA: Diagnosis not present

## 2022-09-06 DIAGNOSIS — J811 Chronic pulmonary edema: Secondary | ICD-10-CM | POA: Diagnosis not present

## 2022-09-06 LAB — BASIC METABOLIC PANEL
Anion gap: 10 (ref 5–15)
BUN: 23 mg/dL (ref 8–23)
CO2: 28 mmol/L (ref 22–32)
Calcium: 8.5 mg/dL — ABNORMAL LOW (ref 8.9–10.3)
Chloride: 102 mmol/L (ref 98–111)
Creatinine, Ser: 1.51 mg/dL — ABNORMAL HIGH (ref 0.44–1.00)
GFR, Estimated: 33 mL/min — ABNORMAL LOW (ref >60)
Glucose, Bld: 288 mg/dL — ABNORMAL HIGH (ref 70–99)
Potassium: 3 mmol/L — ABNORMAL LOW (ref 3.5–5.1)
Sodium: 140 mmol/L (ref 135–145)

## 2022-09-06 LAB — GLUCOSE, CAPILLARY
Glucose-Capillary: 292 mg/dL — ABNORMAL HIGH (ref 70–99)
Glucose-Capillary: 176 mg/dL — ABNORMAL HIGH (ref 70–99)

## 2022-09-06 LAB — MAGNESIUM: Magnesium: 2.1 mg/dL (ref 1.7–2.4)

## 2022-09-06 MED ORDER — HUMALOG MIX 75/25 (75-25) 100 UNIT/ML ~~LOC~~ SUSP: 10.0000 [IU] | SUBCUTANEOUS | Status: DC

## 2022-09-06 MED ORDER — INSULIN ASPART PROT & ASPART (70-30 MIX) 100 UNIT/ML ~~LOC~~ SUSP
12.0000 [IU] | Freq: Two times a day (BID) | SUBCUTANEOUS | Status: DC
  Filled 2022-09-06: qty 10

## 2022-09-06 MED ORDER — CARVEDILOL 3.125 MG PO TABS
18.7500 mg | ORAL_TABLET | Freq: Two times a day (BID) | ORAL | Status: DC
  Administered 2022-09-06: 18.75 mg via ORAL
  Filled 2022-09-06: qty 2

## 2022-09-06 MED ORDER — LIVING BETTER WITH HEART FAILURE BOOK: Freq: Once | Status: AC

## 2022-09-06 MED ORDER — SPIRONOLACTONE 25 MG PO TABS: 25.0000 mg | ORAL_TABLET | Freq: Every day | ORAL | 0 refills | Status: DC

## 2022-09-06 MED ORDER — POTASSIUM CHLORIDE CRYS ER 20 MEQ PO TBCR
40.0000 meq | EXTENDED_RELEASE_TABLET | Freq: Two times a day (BID) | ORAL | Status: DC
  Administered 2022-09-06: 40 meq via ORAL
  Filled 2022-09-06: qty 2

## 2022-09-06 MED ORDER — FUROSEMIDE 40 MG PO TABS: 40.0000 mg | ORAL_TABLET | Freq: Every day | ORAL | 0 refills | Status: DC

## 2022-09-06 MED ORDER — POTASSIUM CHLORIDE CRYS ER 20 MEQ PO TBCR: 20.0000 meq | EXTENDED_RELEASE_TABLET | Freq: Every day | ORAL | 0 refills | Status: DC

## 2022-09-06 NOTE — TOC Transition Note (Signed)
Transition of Care [facility]) - CM/SW Discharge Note   Patient Details  Name: Emily Jennings MRN: [id number] Date of Birth: [birth year]  Transition of Care [facility] CM/SW Contact:  Boneta Lucks, RN Phone Number: 09/06/2022, 11:35 AM   Clinical Narrative:   Patient admitted with CHF. Living better with CHF book ordered for patient to review. Discuss [facility] NP for additional education. Patient is agreeable. Consult placed.    Final next level of care: Home/Self Care Barriers to Discharge: Barriers Resolved   Patient Goals and CMS Choice Patient states their goals for this hospitalization and ongoing recovery are:: to go home. CMS Medicare.gov Compare Post Acute Care list provided to:: Patient Choice offered to / list presented to : Patient    Discharge Placement               Patient and family notified of of transfer: 09/06/22  Discharge Plan and Services     Social Determinants of Health (SDOH) Interventions Food Insecurity Interventions: Intervention Not Indicated Housing Interventions: Intervention Not Indicated Transportation Interventions: Intervention Not Indicated Utilities Interventions: Intervention Not Indicated   Readmission Risk Interventions     No data to display

## 2022-09-06 NOTE — Evaluation (Signed)
Physical Therapy Evaluation Patient Details Name: Emily Jennings MRN: [id number] DOB: December 17, 1933 Today's Date: 09/06/2022  History of Present Illness  88 y.o. female with medical history significant of hypertension, type 2 diabetes mellitus, mild aortic stenosis, CKD stage IIIb, GERD presented to ED with shortness of breath.  Patient has been having shortness of breath for last couple of weeks.  She was initially referred by her PCP to cardiology for heart murmur, she underwent transthoracic echo and was found to have normal ejection fraction with indeterminant diastolic dysfunction and mild aortic stenosis.  Patient continued to have worsening shortness of breath with orthopnea so she went to see her PCP today.  Per PCP note, patient was found to have edema which was new over the course of last 3 weeks and she also was found to have some crackles at the bases so CHF was suspected and thus her PCP sent her to the emergency department.  Other than exertional shortness of breath and orthopnea, patient has no complaints.  Patient's husband is at the bedside as well.   Clinical Impression  Patient presents supine in bed with husband at bedside. She is very pleasant, alert and cooperative. Patient performs bed mobility Mod I. She is able to ambulate to restroom independently with no AD. Patient able to ambulate 75 feet in hallway with SBA and no AD. Able to ambulate 3 stairs using single rail and supervision. She shows good stability overall but does require intermittent standing rest breaks due to fatigue. At this time, patient most limited by fatigue. Educated patient and husband on continued mobility at home and to pace activity according to PRE, but try to get up and walk a few minutes every few hours to improve activity tolerance. Patient appears to have very good support with husband to assist as needed. Patient left sitting at EOB, husband present in room, phone and call bell in reach. Patient discharged to  care of nursing for ambulation daily as tolerated for length of stay.     Recommendations for follow up therapy are one component of a multi-disciplinary discharge planning process, led by the attending physician.  Recommendations may be updated based on patient status, additional functional criteria and insurance authorization.  Follow Up Recommendations No PT follow up      Assistance Recommended at Discharge Intermittent Supervision/Assistance  Patient can return home with the following  A little help with walking and/or transfers;Help with stairs or ramp for entrance    Equipment Recommendations Other (comment) (shower chair)  Recommendations for Other Services       Functional Status Assessment Patient has had a recent decline in their functional status and demonstrates the ability to make significant improvements in function in a reasonable and predictable amount of time.     Precautions / Restrictions Precautions Precautions: None Restrictions Weight Bearing Restrictions: No      Mobility  Bed Mobility Overal bed mobility: Modified Independent                  Transfers Overall transfer level: Modified independent Equipment used: None                    Ambulation/Gait Ambulation/Gait assistance: Supervision Gait Distance (Feet): 75 Feet Assistive device: Rolling walker (2 wheels) Gait Pattern/deviations: Decreased step length - right, Decreased step length - left Gait velocity: decreased        Stairs Stairs: Yes Stairs assistance: Supervision Stair Management: One rail Left Number of Stairs: 3 General  stair comments: alternating ascending, step to pattern descending  Wheelchair Mobility    Modified Rankin (Stroke Patients Only)       Balance Overall balance assessment: Modified Independent                                           Pertinent Vitals/Pain Pain Assessment Pain Assessment: No/denies pain     Home Living Family/patient expects to be discharged to:: Private residence Living Arrangements: Spouse/significant other Available Help at Discharge: Family Type of Home: House Home Access: Stairs to enter Entrance Stairs-Rails: None Technical brewer of Steps: 3   Home Layout: One level Home Equipment: Cane - single point      Prior Function Prior Level of Function : Independent/Modified Independent                     Hand Dominance        Extremity/Trunk Assessment   Upper Extremity Assessment Upper Extremity Assessment: Overall WFL for tasks assessed    Lower Extremity Assessment Lower Extremity Assessment: Overall WFL for tasks assessed    Cervical / Trunk Assessment Cervical / Trunk Assessment: Normal  Communication   Communication: No difficulties  Cognition Arousal/Alertness: Awake/alert Behavior During Therapy: WFL for tasks assessed/performed Overall Cognitive Status: Within Functional Limits for tasks assessed                                          General Comments General comments (skin integrity, edema, etc.): good seated and standing balance, most limited by fatigue    Exercises     Assessment/Plan    PT Assessment Patient does not need any further PT services  PT Problem List         PT Treatment Interventions      PT Goals (Current goals can be found in the Care Plan section)  Acute Rehab PT Goals Patient Stated Goal: Return home PT Goal Formulation: With patient/family Time For Goal Achievement: 09/06/22 Potential to Achieve Goals: Good    Frequency       Co-evaluation               AM-PAC PT "6 Clicks" Mobility  Outcome Measure Help needed turning from your back to your side while in a flat bed without using bedrails?: None Help needed moving from lying on your back to sitting on the side of a flat bed without using bedrails?: None Help needed moving to and from a bed to a chair  (including a wheelchair)?: None Help needed standing up from a chair using your arms (e.g., wheelchair or bedside chair)?: None Help needed to walk in hospital room?: None Help needed climbing 3-5 steps with a railing? : A Little 6 Click Score: 23    End of Session Equipment Utilized During Treatment: Gait belt Activity Tolerance: Patient tolerated treatment well;Patient limited by fatigue Patient left: in bed;with call bell/phone within reach;with family/visitor present Nurse Communication: Mobility status PT Visit Diagnosis: Unsteadiness on feet (R26.81);Other abnormalities of gait and mobility (R26.89)    Time: 2947-6546 PT Time Calculation (min) (ACUTE ONLY): 23 min   Charges:   PT Evaluation $PT Eval Low Complexity: 1 Low     2:01 PM, 09/06/22 Josue Hector PT DPT  Physical Therapist with [facility]  [facility]  [phone]

## 2022-09-06 NOTE — Discharge Summary (Signed)
Physician Discharge Summary  Emily Jennings BOF:[id number] DOB: 1934/05/05 DOA: 09/05/2022  PCP: [name], MD  Admit date: 09/05/2022 Discharge date: 09/06/2022  Admitted From:  Home  Disposition: Home   Recommendations for Outpatient Follow-up:  Follow up with PCP in 1 weeks Follow up with cardiology office in 2 weeks Please obtain BMP in 1 week to follow up renal function and electrolytes Please weigh every 1-2 days and report weight gain>5 lbs to PCP/cardiology.   Discharge Condition: STABLE   CODE STATUS: FULL DIET: 2 gm sodium restricted   Brief Hospitalization Summary: Please see all hospital notes, images, labs for full details of the hospitalization. ADMISSION HPI:  88 y.o. female with medical history significant of hypertension, type 2 diabetes mellitus, mild aortic stenosis, CKD stage IIIb, GERD presented to ED with shortness of breath.  Patient has been having shortness of breath for last couple of weeks.  She was initially referred by her PCP to cardiology for heart murmur, she underwent transthoracic echo and was found to have normal ejection fraction with indeterminant diastolic dysfunction and mild aortic stenosis.  Patient continued to have worsening shortness of breath with orthopnea so she went to see her PCP today.  Per PCP note, patient was found to have edema which was new over the course of last 3 weeks and she also was found to have some crackles at the bases so CHF was suspected and thus her PCP sent her to the emergency department.  Other than exertional shortness of breath and orthopnea, patient has no complaints.  Patient's husband is at the bedside as well.   ED Course: Upon arrival to ED, patient's blood pressure was slightly elevated otherwise she was saturating 96% on room air.  Chest x-ray shows vascular congestion.  BNP still is pending.  Rest of the labs are normal.  Patient is ordered 1 dose of IV Lasix and hospitalist service were consulted for  admission.  Hospital course  Briefly, patient was admitted with mild acute heart failure exacerbation with symptoms of shortness of breath.  She was started on IV Lasix 40 mg IV every 12 with good results.  Patient has diuresed well and her weight has come down to her baseline.  She is feeling much better.  She is ambulating.  She has no further shortness of breath symptoms.  She would like to go home today.  I have encouraged and advised close outpatient follow-up for repeat labs.  Working to replace potassium.  She will discharge on oral Lasix 40 mg daily, spironolactone 25 mg daily, resume prior home blood pressure medications.  Close outpatient follow-up with PCP and cardiology strongly recommended for labs and medication adjustments.  Patient verbalized understanding.  Patient was also advised to weigh daily and report greater than 5 pounds weight gain to PCP and cardiology for further instructions.  Patient verbalized understanding.  Discharge Diagnoses:  Principal Problem:   CHF (congestive heart failure) (HCC) Active Problems:   Hypokalemia   Essential hypertension   Type 2 diabetes mellitus (HCC)   GERD (gastroesophageal reflux disease)   Stage 3b chronic kidney disease (HCC)   Acute on chronic diastolic (congestive) heart failure [facility])   Discharge Instructions: Discharge Instructions     Ambulatory referral to Cardiology   Complete by: As directed    Hospital Follow up for Heart Failure with med changes, needs labs BMP      Allergies as of 09/06/2022       Reactions   Benadryl [diphenhydramine  Hcl] Hypertension   Citalopram Other (See Comments)   unknown   Metformin And Related Diarrhea   Lost appetite and weight    Tramadol Other (See Comments)   Felt light headed and dizzy   Crestor [rosuvastatin] Other (See Comments)   "Feet swelling", makes her feel weak        Medication List     TAKE these medications    acetaminophen 500 MG tablet Commonly known as:  TYLENOL Take one tablet two times daily for 3 days, then as needed   alendronate 70 MG tablet Commonly known as: FOSAMAX TAKE 1 TABLET BY MOUTH EVERY 7 DAYS. TAKE WITH A FULL GLASS OF WATER ON AN EMPTY STOMACH What changed: See the new instructions.   amLODipine-olmesartan 10-40 MG tablet Commonly known as: AZOR TAKE 1 TABLET BY MOUTH EVERY DAY   aspirin EC 81 MG tablet Take 81 mg by mouth daily.   blood glucose meter kit and supplies Kit One touch Ultra. Use three times daily as directed. (FOR ICD E11.65)   carvedilol 12.5 MG tablet Commonly known as: COREG Take 1.5 tablets (18.75 mg total) by mouth in the morning and at bedtime.   cholecalciferol 25 MCG (1000 UNIT) tablet Commonly known as: VITAMIN D3 Take 2,000 Units by mouth daily.   furosemide 40 MG tablet Commonly known as: LASIX Take 1 tablet (40 mg total) by mouth daily. What changed: when to take this   HumaLOG Mix 75/25 (75-25) 100 UNIT/ML Susp injection Generic drug: insulin lispro protamine-lispro Inject 10-15 Units into the skin See admin instructions. 10 units bid if BG above 90 15 units  if it is over 300 What changed:  how much to take how to take this when to take this additional instructions   Insulin Pen Needle 32G X 4 MM Misc 1 each by Does not apply route as directed.   Insulin Syringe-Needle U-100 31G X 5/16" 0.5 ML Misc Commonly known as: BD Insulin Syringe U/F USE AS DIRECTED TWICE DAILY   mupirocin ointment 2 % Commonly known as: BACTROBAN Apply topically 2 (two) times daily.   OneTouch Delica Lancets 19J Misc 1 each by Does not apply route 3 (three) times daily. Use to check glucose three times daily   OneTouch Ultra test strip Generic drug: glucose blood USE TO TEST 2 TIMES A DAY   potassium chloride SA 20 MEQ tablet Commonly known as: KLOR-CON M Take 1 tablet (20 mEq total) by mouth daily.   rosuvastatin 5 MG tablet Commonly known as: CRESTOR TAKE 1 TABLET (5 MG TOTAL) BY  MOUTH DAILY.   spironolactone 25 MG tablet Commonly known as: Aldactone Take 1 tablet (25 mg total) by mouth daily.        Follow-up Information     [name], MD. Schedule an appointment as soon as possible for a visit in 1 week(s).   Specialty: Family Medicine Why: Hospital Follow Up Contact information: [address], [place] [address] [phone]         Arnoldo Lenis, MD. Schedule an appointment as soon as possible for a visit in 2 week(s).   Specialty: Cardiology Why: Hospital Follow Up Contact information: [place] [address] [phone]                Allergies  Allergen Reactions   Benadryl [Diphenhydramine Hcl] Hypertension   Citalopram Other (See Comments)    unknown   Metformin And Related Diarrhea  Lost appetite and weight    Tramadol Other (See Comments)    Felt light headed and dizzy   Crestor [Rosuvastatin] Other (See Comments)    "Feet swelling", makes her feel weak   Allergies as of 09/06/2022       Reactions   Benadryl [diphenhydramine Hcl] Hypertension   Citalopram Other (See Comments)   unknown   Metformin And Related Diarrhea   Lost appetite and weight    Tramadol Other (See Comments)   Felt light headed and dizzy   Crestor [rosuvastatin] Other (See Comments)   "Feet swelling", makes her feel weak        Medication List     TAKE these medications    acetaminophen 500 MG tablet Commonly known as: TYLENOL Take one tablet two times daily for 3 days, then as needed   alendronate 70 MG tablet Commonly known as: FOSAMAX TAKE 1 TABLET BY MOUTH EVERY 7 DAYS. TAKE WITH A FULL GLASS OF WATER ON AN EMPTY STOMACH What changed: See the new instructions.   amLODipine-olmesartan 10-40 MG tablet Commonly known as: AZOR TAKE 1 TABLET BY MOUTH EVERY DAY   aspirin EC 81 MG tablet Take 81 mg by mouth daily.   blood glucose meter kit and supplies Kit One touch Ultra. Use three  times daily as directed. (FOR ICD E11.65)   carvedilol 12.5 MG tablet Commonly known as: COREG Take 1.5 tablets (18.75 mg total) by mouth in the morning and at bedtime.   cholecalciferol 25 MCG (1000 UNIT) tablet Commonly known as: VITAMIN D3 Take 2,000 Units by mouth daily.   furosemide 40 MG tablet Commonly known as: LASIX Take 1 tablet (40 mg total) by mouth daily. What changed: when to take this   HumaLOG Mix 75/25 (75-25) 100 UNIT/ML Susp injection Generic drug: insulin lispro protamine-lispro Inject 10-15 Units into the skin See admin instructions. 10 units bid if BG above 90 15 units  if it is over 300 What changed:  how much to take how to take this when to take this additional instructions   Insulin Pen Needle 32G X 4 MM Misc 1 each by Does not apply route as directed.   Insulin Syringe-Needle U-100 31G X 5/16" 0.5 ML Misc Commonly known as: BD Insulin Syringe U/F USE AS DIRECTED TWICE DAILY   mupirocin ointment 2 % Commonly known as: BACTROBAN Apply topically 2 (two) times daily.   OneTouch Delica Lancets 58K Misc 1 each by Does not apply route 3 (three) times daily. Use to check glucose three times daily   OneTouch Ultra test strip Generic drug: glucose blood USE TO TEST 2 TIMES A DAY   potassium chloride SA 20 MEQ tablet Commonly known as: KLOR-CON M Take 1 tablet (20 mEq total) by mouth daily.   rosuvastatin 5 MG tablet Commonly known as: CRESTOR TAKE 1 TABLET (5 MG TOTAL) BY MOUTH DAILY.   spironolactone 25 MG tablet Commonly known as: Aldactone Take 1 tablet (25 mg total) by mouth daily.        Procedures/Studies: DG CHEST PORT 1 VIEW  Result Date: 09/06/2022 CLINICAL DATA:  Congestive heart failure in a female at age 89. EXAM: PORTABLE CHEST 1 VIEW COMPARISON:  September 05, 2022 FINDINGS: EKG leads project over the chest. Heart size stable enlarged, at least moderate cardiac enlargement but accentuated by portable technique and AP  projection. Interstitial prominence which is similar to previous imaging. No of biller opacities or lobar consolidation. No pneumothorax. On limited assessment no acute  skeletal process. IMPRESSION: Cardiomegaly with signs of pulmonary edema. Correlate with evidence of heart failure or volume overload. Electronically Signed   By: Zetta Bills M.D.   On: 09/06/2022 09:46   DG Chest 2 View  Result Date: 09/05/2022 CLINICAL DATA:  Increased shortness of breath, dyspnea on exertion EXAM: CHEST - 2 VIEW COMPARISON:  03/22/2022 FINDINGS: Frontal and lateral views of the chest demonstrate an enlarged cardiac silhouette. There is increased central vascular congestion, with diffuse increased interstitial prominence. No evidence of airspace disease. Trace right pleural effusion. No pneumothorax. No acute bony abnormalities. IMPRESSION: 1. Findings most consistent with congestive heart failure and developing interstitial edema. Electronically Signed   By: Randa Ngo M.D.   On: 09/05/2022 15:19     Subjective: Pt says she is feeling much better, breathing better since being diuresed with IV lasix and he weight has come down by a lot.  She says she feels ready to go home today.    Discharge Exam: Vitals:   09/06/22 0026 09/06/22 0545  BP: (!) 161/60 (!) 173/70  Pulse: 87 89  Resp: 19 19  Temp: 98 F (36.7 C) 98.3 F (36.8 C)  SpO2: 100% 93%   Vitals:   09/05/22 2001 09/05/22 2033 09/06/22 0026 09/06/22 0545  BP: (!) 170/88 (!) 189/87 (!) 161/60 (!) 173/70  Pulse: 76 83 87 89  Resp:  _0 Temp:  97.8 F (36.6 C) 98 F (36.7 C) 98.3 F (36.8 C)  TempSrc:  Oral    SpO2: 94% 97% 100% 93%  Weight:  60.5 kg    Height:  5' (1.524 m)     General: Pt is alert, awake, not in acute distress Cardiovascular: RRR, S1/S2 +, no rubs, no gallops Respiratory: CTA bilaterally, no wheezing, no rhonchi, no crackles heard.  Abdominal: Soft, NT, ND, bowel sounds + Extremities: trace pretibial edema, no  cyanosis   The results of significant diagnostics from this hospitalization (including imaging, microbiology, ancillary and laboratory) are listed below for reference.     Microbiology: Recent Results (from the past 240 hour(s))  Resp panel by RT-PCR (RSV, Flu A&B, Covid) Anterior Nasal Swab     Status: None   Collection Time: 09/05/22  5:12 PM   Specimen: Anterior Nasal Swab  Result Value Ref Range Status   SARS Coronavirus 2 by RT PCR NEGATIVE NEGATIVE Final    Comment: (NOTE) SARS-CoV-2 target nucleic acids are NOT DETECTED.  The SARS-CoV-2 RNA is generally detectable in upper respiratory specimens during the acute phase of infection. The lowest concentration of SARS-CoV-2 viral copies this assay can detect is 138 copies/mL. A negative result does not preclude SARS-Cov-2 infection and should not be used as the sole basis for treatment or other patient management decisions. A negative result may occur with  improper specimen collection/handling, submission of specimen other than nasopharyngeal swab, presence of viral mutation(s) within the areas targeted by this assay, and inadequate number of viral copies(<138 copies/mL). A negative result must be combined with clinical observations, patient history, and epidemiological information. The expected result is Negative.  Fact Sheet for Patients:  EntrepreneurPulse.com.au  Fact Sheet for Healthcare Providers:  IncredibleEmployment.be  This test is no t yet approved or cleared by the [place] FDA and  has been authorized for detection and/or diagnosis of SARS-CoV-2 by FDA under an Emergency Use Authorization (EUA). This EUA will remain  in effect (meaning this test can be used) for the duration of the COVID-19 declaration under Section  564(b)(1) of the Act, 21 U.S.C.section 360bbb-3(b)(1), unless the authorization is terminated  or revoked sooner.       Influenza A by PCR NEGATIVE  NEGATIVE Final   Influenza B by PCR NEGATIVE NEGATIVE Final    Comment: (NOTE) The Xpert Xpress SARS-CoV-2/FLU/RSV plus assay is intended as an aid in the diagnosis of influenza from Nasopharyngeal swab specimens and should not be used as a sole basis for treatment. Nasal washings and aspirates are unacceptable for Xpert Xpress SARS-CoV-2/FLU/RSV testing.  Fact Sheet for Patients: EntrepreneurPulse.com.au  Fact Sheet for Healthcare Providers: IncredibleEmployment.be  This test is not yet approved or cleared by the [place] FDA and has been authorized for detection and/or diagnosis of SARS-CoV-2 by FDA under an Emergency Use Authorization (EUA). This EUA will remain in effect (meaning this test can be used) for the duration of the COVID-19 declaration under Section 564(b)(1) of the Act, 21 U.S.C. section 360bbb-3(b)(1), unless the authorization is terminated or revoked.     Resp Syncytial Virus by PCR NEGATIVE NEGATIVE Final    Comment: (NOTE) Fact Sheet for Patients: EntrepreneurPulse.com.au  Fact Sheet for Healthcare Providers: IncredibleEmployment.be  This test is not yet approved or cleared by the [place] FDA and has been authorized for detection and/or diagnosis of SARS-CoV-2 by FDA under an Emergency Use Authorization (EUA). This EUA will remain in effect (meaning this test can be used) for the duration of the COVID-19 declaration under Section 564(b)(1) of the Act, 21 U.S.C. section 360bbb-3(b)(1), unless the authorization is terminated or revoked.  Performed at [facility], [address] [place], [place] [address]      Labs: BNP (last 3 results) Recent Labs    03/22/22 1600 09/05/22 1144 09/05/22 1500  BNP 193.0* WILL FOLLOW 774.1*   Basic Metabolic Panel: Recent Labs  Lab 09/05/22 1144 09/05/22 1500 09/05/22 1727 09/06/22 0437 09/06/22 0512  NA 136 137  --  140  --    K 3.9 3.5  --  3.0*  --   CL 96 101  --  102  --   CO2 27 29  --  28  --   GLUCOSE 156* 131*  --  288*  --   BUN 20 22  --  23  --   CREATININE 1.35* 1.55*  --  1.51*  --   CALCIUM 9.1 8.8*  --  8.5*  --   MG  --   --  2.3  --  2.1   Liver Function Tests: Recent Labs  Lab 09/05/22 1144  AST 32  ALT 28  ALKPHOS 70  BILITOT 0.4  PROT 6.1  ALBUMIN 3.8   No results for input(s): "LIPASE", "AMYLASE" in the last 168 hours. No results for input(s): "AMMONIA" in the last 168 hours. CBC: Recent Labs  Lab 09/05/22 1144 09/05/22 1500  WBC 5.4 5.9  NEUTROABS 3.5  --   HGB 11.1 10.3*  HCT 35.0 32.0*  MCV 95 94.4  PLT 215 205   Cardiac Enzymes: No results for input(s): "CKTOTAL", "CKMB", "CKMBINDEX", "TROPONINI" in the last 168 hours. BNP: Invalid input(s): "POCBNP" CBG: Recent Labs  Lab 09/05/22 2104 09/06/22 0803  GLUCAP 109* 292*   D-Dimer No results for input(s): "DDIMER" in the last 72 hours. Hgb A1c No results for input(s): "HGBA1C" in the last 72 hours. Lipid Profile No results for input(s): "CHOL", "HDL", "LDLCALC", "TRIG", "CHOLHDL", "LDLDIRECT" in the last 72 hours. Thyroid function studies Recent Labs    09/05/22 1500  TSH 0.678  Anemia work up Recent Labs    09/05/22 1144  FERRITIN 123  TIBC 288  IRON 54   Urinalysis    Component Value Date/Time   COLORURINE STRAW (A) 09/05/2022 1712   APPEARANCEUR CLEAR 09/05/2022 1712   LABSPEC 1.004 (L) 09/05/2022 1712   PHURINE 6.0 09/05/2022 1712   GLUCOSEU 50 (A) 09/05/2022 1712   HGBUR NEGATIVE 09/05/2022 1712   HGBUR trace-intact 04/28/2010 1259   BILIRUBINUR NEGATIVE 09/05/2022 1712   BILIRUBINUR neg 09/29/2013 1341   KETONESUR NEGATIVE 09/05/2022 1712   PROTEINUR >=300 (A) 09/05/2022 1712   UROBILINOGEN 0.2 09/29/2013 1341   UROBILINOGEN 0.2 12/28/2010 1743   NITRITE NEGATIVE 09/05/2022 1712   LEUKOCYTESUR TRACE (A) 09/05/2022 1712   Sepsis Labs Recent Labs  Lab 09/05/22 1144  09/05/22 1500  WBC 5.4 5.9   Microbiology Recent Results (from the past 240 hour(s))  Resp panel by RT-PCR (RSV, Flu A&B, Covid) Anterior Nasal Swab     Status: None   Collection Time: 09/05/22  5:12 PM   Specimen: Anterior Nasal Swab  Result Value Ref Range Status   SARS Coronavirus 2 by RT PCR NEGATIVE NEGATIVE Final    Comment: (NOTE) SARS-CoV-2 target nucleic acids are NOT DETECTED.  The SARS-CoV-2 RNA is generally detectable in upper respiratory specimens during the acute phase of infection. The lowest concentration of SARS-CoV-2 viral copies this assay can detect is 138 copies/mL. A negative result does not preclude SARS-Cov-2 infection and should not be used as the sole basis for treatment or other patient management decisions. A negative result may occur with  improper specimen collection/handling, submission of specimen other than nasopharyngeal swab, presence of viral mutation(s) within the areas targeted by this assay, and inadequate number of viral copies(<138 copies/mL). A negative result must be combined with clinical observations, patient history, and epidemiological information. The expected result is Negative.  Fact Sheet for Patients:  EntrepreneurPulse.com.au  Fact Sheet for Healthcare Providers:  IncredibleEmployment.be  This test is no t yet approved or cleared by the [place] FDA and  has been authorized for detection and/or diagnosis of SARS-CoV-2 by FDA under an Emergency Use Authorization (EUA). This EUA will remain  in effect (meaning this test can be used) for the duration of the COVID-19 declaration under Section 564(b)(1) of the Act, 21 U.S.C.section 360bbb-3(b)(1), unless the authorization is terminated  or revoked sooner.       Influenza A by PCR NEGATIVE NEGATIVE Final   Influenza B by PCR NEGATIVE NEGATIVE Final    Comment: (NOTE) The Xpert Xpress SARS-CoV-2/FLU/RSV plus assay is intended as an  aid in the diagnosis of influenza from Nasopharyngeal swab specimens and should not be used as a sole basis for treatment. Nasal washings and aspirates are unacceptable for Xpert Xpress SARS-CoV-2/FLU/RSV testing.  Fact Sheet for Patients: EntrepreneurPulse.com.au  Fact Sheet for Healthcare Providers: IncredibleEmployment.be  This test is not yet approved or cleared by the [place] FDA and has been authorized for detection and/or diagnosis of SARS-CoV-2 by FDA under an Emergency Use Authorization (EUA). This EUA will remain in effect (meaning this test can be used) for the duration of the COVID-19 declaration under Section 564(b)(1) of the Act, 21 U.S.C. section 360bbb-3(b)(1), unless the authorization is terminated or revoked.     Resp Syncytial Virus by PCR NEGATIVE NEGATIVE Final    Comment: (NOTE) Fact Sheet for Patients: EntrepreneurPulse.com.au  Fact Sheet for Healthcare Providers: IncredibleEmployment.be  This test is not yet approved or cleared by the [place]  FDA and has been authorized for detection and/or diagnosis of SARS-CoV-2 by FDA under an Emergency Use Authorization (EUA). This EUA will remain in effect (meaning this test can be used) for the duration of the COVID-19 declaration under Section 564(b)(1) of the Act, 21 U.S.C. section 360bbb-3(b)(1), unless the authorization is terminated or revoked.  Performed at [facility], [address] [place], [place] [address]    Time coordinating discharge:   SIGNED:  Irwin Brakeman, MD  [facility] 09/06/2022, 11:37 AM How to contact the [facility] Attending or Consulting provider [place] or covering provider during after hours [place], for this patient?  Check the care team in [facility] and look for a) attending/consulting [facility] provider listed and b) the [facility] team listed Log into [email or web address] and use [place]'s universal password to  access. If you do not have the password, please contact the hospital operator. Locate the [facility] provider you are looking for under [facility] and page to a number that you can be directly reached. If you still have difficulty reaching the provider, please page the [facility] (Director on Call) for the Hospitalists listed on amion for assistance.

## 2022-09-06 NOTE — Discharge Instructions (Addendum)
PLEASE HAVE LABS DONE IN 1 WEEK WITH PRIMARY CARE OR CARDIOLOGY TO FOLLOW UP POTASSIUM AND KIDNEY FUNCTION.      IMPORTANT INFORMATION: PAY CLOSE ATTENTION   PHYSICIAN DISCHARGE INSTRUCTIONS  Follow with Primary care provider  Emily Helper, MD  and other consultants as instructed by your Hospitalist Physician  [place] IF SYMPTOMS COME BACK, WORSEN OR NEW PROBLEM DEVELOPS   Please note: You were cared for by a hospitalist during your hospital stay. Every effort will be made to forward records to your primary care provider.  You can request that your primary care provider send for your hospital records if they have not received them.  Once you are discharged, your primary care physician will handle any further medical issues. Please note that NO REFILLS for any discharge medications will be authorized once you are discharged, as it is imperative that you return to your primary care physician (or establish a relationship with a primary care physician if you do not have one) for your post hospital discharge needs so that they can reassess your need for medications and monitor your lab values.  Please get a complete blood count and chemistry panel checked by your Primary MD at your next visit, and again as instructed by your Primary MD.  Get Medicines reviewed and adjusted: Please take all your medications with you for your next visit with your Primary MD  Laboratory/radiological data: Please request your Primary MD to go over all hospital tests and procedure/radiological results at the follow up, please ask your primary care provider to get all Hospital records sent to his/her office.  In some cases, they will be blood work, cultures and biopsy results pending at the time of your discharge. Please request that your primary care provider follow up on these results.  If you are diabetic, please bring your blood sugar readings with you to your follow up  appointment with primary care.    Please call and make your follow up appointments as soon as possible.    Also Note the following: If you experience worsening of your admission symptoms, develop shortness of breath, life threatening emergency, suicidal or homicidal thoughts you must seek medical attention immediately by calling 911 or calling your MD immediately  if symptoms less severe.  You must read complete instructions/literature along with all the possible adverse reactions/side effects for all the Medicines you take and that have been prescribed to you. Take any new Medicines after you have completely understood and accpet all the possible adverse reactions/side effects.   Do not drive when taking Pain medications or sleeping medications (Benzodiazepines)  Do not take more than prescribed Pain, Sleep and Anxiety Medications. It is not advisable to combine anxiety,sleep and pain medications without talking with your primary care practitioner  Special Instructions: If you have smoked or chewed Tobacco  in the last 2 yrs please stop smoking, stop any regular Alcohol  and or any Recreational drug use.  Wear Seat belts while driving.  Do not drive if taking any narcotic, mind altering or controlled substances or recreational drugs or alcohol.

## 2022-09-07 ENCOUNTER — Encounter: Payer: Self-pay | Admitting: Nurse Practitioner

## 2022-09-07 ENCOUNTER — Ambulatory Visit: Payer: Medicare Other | Attending: Nurse Practitioner | Admitting: Nurse Practitioner

## 2022-09-07 ENCOUNTER — Ambulatory Visit: Payer: Medicare Other | Admitting: Internal Medicine

## 2022-09-07 VITALS — BP 146/50 | HR 76 | Ht 62.0 in | Wt 132.6 lb

## 2022-09-07 DIAGNOSIS — I10 Essential (primary) hypertension: Secondary | ICD-10-CM

## 2022-09-07 DIAGNOSIS — I5033 Acute on chronic diastolic (congestive) heart failure: Secondary | ICD-10-CM | POA: Diagnosis not present

## 2022-09-07 DIAGNOSIS — Z79899 Other long term (current) drug therapy: Secondary | ICD-10-CM

## 2022-09-07 DIAGNOSIS — R0602 Shortness of breath: Secondary | ICD-10-CM | POA: Diagnosis not present

## 2022-09-07 DIAGNOSIS — R0609 Other forms of dyspnea: Secondary | ICD-10-CM | POA: Diagnosis not present

## 2022-09-07 DIAGNOSIS — I350 Nonrheumatic aortic (valve) stenosis: Secondary | ICD-10-CM | POA: Diagnosis not present

## 2022-09-07 DIAGNOSIS — R600 Localized edema: Secondary | ICD-10-CM

## 2022-09-07 MED ORDER — FUROSEMIDE 40 MG PO TABS: 40.0000 mg | ORAL_TABLET | Freq: Two times a day (BID) | ORAL | 0 refills | Status: DC

## 2022-09-07 MED ORDER — SPIRONOLACTONE 25 MG PO TABS: 25.0000 mg | ORAL_TABLET | Freq: Every day | ORAL | 6 refills | Status: DC

## 2022-09-07 NOTE — Progress Notes (Unsigned)
Cardiology Office Note:    Date:  09/07/2022  ID:  Emily Jennings, DOB May 06, 1934, MRN [id number]  PCP:  [name], [place] [facility] Cardiologist:  Carlyle Dolly, MD     Referring MD: [name], MD   CC: Here for Hospital follow-up  History of Present Illness:    Emily Jennings is a 88 y.o. female with a hx of the following:  Cardiovascular disease HFpEF Type 2 diabetes GERD Stage III CKD Hyperlipidemia History of syncope and collapse Leg edema Murmur Dyspnea on exertion Mild AS  Patient is a 88 year old female with past medical history as mentioned above.  She first saw Dr. Carlyle Dolly on July 20, 2022.  She was overall doing well from a cardiac perspective.  Was compliant with her medications.  Had noted some mild DOE, denied any recent swelling.  Blood pressure was elevated in office that day systolic BP was around 174B.  Echocardiogram was arranged for heart murmur found on exam. Aldactone was lowered to 25 mg daily and carvedilol was increased to 18.75 mg twice daily due to history of chronic kidney disease stage IV and to help with mildly elevated BP in office.  Echocardiogram revealed normal EF, no RWMA, moderate LVH, elevated LAP, normal PASP, moderately dilated left atrium, probable ruptured chordae attached to anterior MV leaflet, trivial MR, mildly calcified aortic valve, mild aortic stenosis with mean gradient measuring 14.7 mmHg, no other significant valvular abnormalities.  Was told to follow-up in 6 months.  Most recently, she had recent admission on September 05, 2022 due to worsening shortness of breath, orthopnea.  She had seen her PCP was found to have edema, this was new over the course of 3 weeks, was found to have crackles at the bases so CHF was suspected, PCP sent her to the ED.  She had no other complaints besides exertional shortness of breath and orthopnea.  CXR revealed vascular congestion.  Was started on IV  Lasix.  She diuresed well, weight came back down to baseline.  Symptoms improved.  She was discharged on Lasix 40 mg daily, Aldactone 25 mg daily, and would resume previous home BP medicines.  It was suggested she follow-up with cardiology and her PCP.  Today patient presents for follow-up.  She states she is "not as badly winded," however she walks about 30 feet and finds herself gasping for breath. Brings in her pills bottles and I reviewed them with her to see what she is taking. Says she hasn't taken Lasix yet nor has she taken Aldactone. Says she was supposed to receive another dose of IV Lasix prior to d/c however she was d/c early that day. Denies any chest pain, worsening shortness of breath, palpitations, syncope, presyncope, dizziness, orthopnea, PND, acute bleeding, claudication.  States she has had a chronic swelling along lower extremity edema.  Denies any other questions or concerns today.  Past Medical History:  Diagnosis Date   Allergy    Anxiety    ANXIETY DISORDER, GENERALIZED 07/15/2007   Qualifier: Diagnosis of  By: Rennie Plowman     Arthritis    Cancer [facility]) 2009   breast, carcinoma in situ left   Carcinoma in situ of breast 05/21/2008   Qualifier: Diagnosis of  By: Moshe Cipro MD, Margaret  Diagnosed in 2009, completed 5 year course of tamoxifen, no evidence of recurrence    Carotid stenosis    11/16/2005  mild plaque formation and stenosis proximal right ECA  Cataract    Complication of anesthesia    Coronary artery disease    cardiac catheterization on 03/20/2006  LAD mid 40% stenosis, left circumflex mild 40% stenosis, RCA mid-vessel 40% to 50% lesion   EF 60%   Diabetes mellitus    GERD (gastroesophageal reflux disease)    Hernia, inguinal    left   Hyperglycemia    Hypertension    Insomnia 11/16/2011   Low blood potassium    Non-insulin dependent type 2 diabetes mellitus ([place])    Osteoporosis    Shortness of breath    2D Echocardiogram 01/26/2009   EF of greater  than 55%, mild MR, mild TR, normal ventricular function   Thickened endometrium 10/26/2017   Noted by gyne in 2017, missed 6 month follow up, referred in 09/2017   Ventricular tachycardia, non-sustained ([place])    developed during stress test 02/08/2006, spontaneously aborted, mild reversible apical defect    Past Surgical History:  Procedure Laterality Date   BREAST LUMPECTOMY Left 2009   Left breast 2009   CATARACT EXTRACTION W/PHACO Left 10/28/2014   Procedure: PHACO EMULSION CATARACT EXTRACTION WITH INTRAOCULAR LENS IMPLANT LEFT EYE ([place]);  Surgeon: Marylynn Pearson, MD;  Location: [place];  Service: Ophthalmology;  Laterality: Left;   COLONOSCOPY     cyst removed from left foot     REFRACTIVE SURGERY Left     Current Medications: Current Meds  Medication Sig   acetaminophen (TYLENOL) 500 MG tablet Take one tablet two times daily for 3 days, then as needed   alendronate (FOSAMAX) 70 MG tablet TAKE 1 TABLET BY MOUTH EVERY 7 DAYS. TAKE WITH A FULL GLASS OF WATER ON AN EMPTY STOMACH (Patient taking differently: Take 70 mg by mouth once a week. TAKE 1 TABLET BY MOUTH EVERY 7 DAYS. TAKE WITH A FULL GLASS OF WATER ON AN EMPTY STOMACH)   amLODipine-olmesartan (AZOR) 10-40 MG tablet TAKE 1 TABLET BY MOUTH EVERY DAY   aspirin EC 81 MG tablet Take 81 mg by mouth daily.   carvedilol (COREG) 12.5 MG tablet Take 1.5 tablets (18.75 mg total) by mouth in the morning and at bedtime.   cholecalciferol (VITAMIN D3) 25 MCG (1000 UT) tablet Take 2,000 Units by mouth daily.   insulin lispro protamine-lispro (HUMALOG MIX 75/25) (75-25) 100 UNIT/ML SUSP injection Inject 10-15 Units into the skin See admin instructions. 10 units bid if BG above 90 15 units  if it is over 300   potassium chloride SA (KLOR-CON M) 20 MEQ tablet Take 1 tablet (20 mEq total) by mouth daily.   rosuvastatin (CRESTOR) 5 MG tablet TAKE 1 TABLET (5 MG TOTAL) BY MOUTH DAILY. (Patient taking differently: Take 5 mg by mouth daily.)    furosemide  (LASIX) 40 MG tablet Take 1 tablet (40 mg total) by mouth daily.     Allergies:   Benadryl [diphenhydramine hcl], Citalopram, Metformin and related, Tramadol, and Crestor [rosuvastatin]   Social History   Socioeconomic History   Marital status: Married    Spouse name: Not on file   Number of children: 2   Years of education: na   Highest education level: Some college, no degree  Occupational History   Occupation: retired    Fish farm manager: retired  Tobacco Use   Smoking status: Never    Passive exposure: Never   Smokeless tobacco: Never  Vaping Use   Vaping Use: Never used  Substance and Sexual Activity   Alcohol use: No   Drug use: No  Sexual activity: Not Currently    Birth control/protection: Post-menopausal  Other Topics Concern   Not on file  Social History Narrative   Lives with her husband and son and attends yoga at the [facility] 2 days a week and speaks to her grandson nightly on the phone   Social Determinants of Health   Financial Resource Strain: Low Risk  (11/14/2021)   Overall Financial Resource Strain (CARDIA)    Difficulty of Paying Living Expenses: Not hard at all  Food Insecurity: No Food Insecurity (09/08/2022)   Hunger Vital Sign    Worried About Running Out of Food in the Last Year: Never true    [place] in the Last Year: Never true  Transportation Needs: No Transportation Needs (09/08/2022)   PRAPARE - Hydrologist (Medical): No    Lack of Transportation (Non-Medical): No  Physical Activity: Insufficiently Active (11/14/2021)   Exercise Vital Sign    Days of Exercise per Week: 7 days    Minutes of Exercise per Session: 20 min  Stress: No Stress Concern Present (11/14/2021)   [place]    Feeling of Stress : Only a little  Social Connections: Moderately Integrated (11/14/2021)   Social Connection and Isolation Panel [NHANES]    Frequency of Communication  with Friends and Family: More than three times a week    Frequency of Social Gatherings with Friends and Family: Once a week    Attends Religious Services: More than 4 times per year    Active Member of [employer] or Organizations: No    Attends Music therapist: Never    Marital Status: Married     Family History: The patient's family history includes Arthritis in an other family member; Asthma in an other family member; Cancer in her father; Colon cancer in her father and paternal aunt; Diabetes in an other family member; Heart disease (age of onset: 29) in her brother; Heart disease (age of onset: 31) in her brother; Hyperlipidemia in her mother; Hypertension in her mother; Stroke in her mother; Urticaria in her mother. There is no history of Esophageal cancer, Stomach cancer, or Rectal cancer.  ROS:   Review of Systems  Constitutional: Negative.   HENT: Negative.    Eyes: Negative.   Respiratory:  Positive for shortness of breath. Negative for cough, hemoptysis, sputum production and wheezing.   Cardiovascular:  Positive for leg swelling. Negative for chest pain, palpitations, orthopnea, claudication and PND.  Gastrointestinal: Negative.   Genitourinary: Negative.   Musculoskeletal: Negative.   Skin: Negative.   Neurological: Negative.   Endo/Heme/Allergies: Negative.   Psychiatric/Behavioral: Negative.      Please see the history of present illness.    All other systems reviewed and are negative.  EKGs/Labs/Other Studies Reviewed:    The following studies were reviewed today:   EKG:  EKG is not ordered today.    1 view chest x-ray on September 06, 2022: Cardiomegaly with signs of pulmonary edema.  Correlate with evidence of heart failure or volume overload.  2 view CXR on 09/05/2022: Findings most consistent with congestive heart failure and developing interstitial edema.   2D echocardiogram on August 01, 2022:  1. Left ventricular ejection fraction, by  estimation, is 60 to 65%. The  left ventricle has normal function. The left ventricle has no regional  wall motion abnormalities. There is moderate left ventricular hypertrophy.  Left ventricular diastolic  parameters are indeterminate.  Elevated left atrial pressure.   2. Right ventricular systolic function is normal. The right ventricular  size is normal. There is normal pulmonary artery systolic pressure.   3. Left atrial size was moderately dilated.   4. Probable ruptured chordae attached to anterior MV leaflet. . The  mitral valve is normal in structure. Trivial mitral valve regurgitation.  No evidence of mitral stenosis.   5. The tricuspid valve is abnormal.   6. The aortic valve is tricuspid. There is mild calcification of the  aortic valve. There is mild thickening of the aortic valve. Aortic valve  regurgitation is not visualized. Mild aortic valve stenosis. Aortic valve  mean gradient measures 14.7 mmHg.  Aortic valve peak gradient measures 28.9 mmHg. Aortic valve area, by VTI  measures 1.64 cm.   7. The inferior vena cava is normal in size with greater than 50%  respiratory variability, suggesting right atrial pressure of 3 mmHg.  Bilateral carotid duplex on Jan 24, 2021: Minor carotid atherosclerosis. No hemodynamically significant ICA stenosis. Degree of narrowing less than 50% bilaterally by ultrasound criteria.   Patent antegrade vertebral flow bilaterally  Recent Labs: 09/05/2022: ALT 28; B Natriuretic Peptide 848.0; Hemoglobin 10.3; Platelets 205; TSH 0.678 09/06/2022: BUN 23; Creatinine, Ser 1.51; Magnesium 2.1; Potassium 3.0; Sodium 140  Recent Lipid Panel    Component Value Date/Time   CHOL 192 11/24/2021 0837   TRIG 43 11/24/2021 0837   HDL 118 11/24/2021 0837   CHOLHDL 1.6 11/24/2021 0837   CHOLHDL 1.8 06/29/2020 0916   VLDL 8 10/31/2016 0845   LDLCALC 65 11/24/2021 0837   LDLCALC 80 06/29/2020 0916    Physical Exam:    VS:  BP (!) 146/50   Pulse 76    Ht '5\' 2"'$  (1.575 m)   Wt 132 lb 9.6 oz (60.1 kg)   SpO2 95%   BMI 24.25 kg/m     Wt Readings from Last 3 Encounters:  09/07/22 132 lb 9.6 oz (60.1 kg)  09/05/22 133 lb 6.1 oz (60.5 kg)  09/05/22 141 lb (64 kg)     GEN: Well nourished, well developed in no acute distress HEENT: Normal NECK: No JVD; No carotid bruits CARDIAC: S1/S2, Grade 2/6 noted along RSB and LSB,, no rubs or gallops noted, 2+ peripheral pulses throughout, strong bilaterally RESPIRATORY:  Coarse and diminshed to auscultation without rales, wheezing or rhonchi, short of breath with exertion, labored breathing with speaking during interview.  MUSCULOSKELETAL:  nonpitting edema along BLE; No deformity  SKIN: Warm and dry NEUROLOGIC:  Alert and oriented x 3 PSYCHIATRIC:  Normal affect   ASSESSMENT:    1. Acute on chronic heart failure with preserved ejection fraction ([place])   2. Shortness of breath   3. Dyspnea on exertion   4. Medication management   5. Nonrheumatic aortic valve stenosis   6. Leg edema   7. Essential hypertension    PLAN:    In order of problems listed above:  Acute on chronic HFpEF exacerbation, medication management, shortness of breath, DOE Recently hospitalized for suspected CHF exacerbation. Most revealed echo revealed EF normal, mild AS, moderate LVH, normal PASP. CXR yesterday showed cardiomegaly with signs of pulmonary edema, this correlated with evidence of heart failure or volume overload. I suspect patient was d/c too early based on what she tells me. She states she did not receive a dose of IV Lasix she was supposed to receive prior to discharge. She has not taken her Lasix PO medication for today, nor has  she taken Aldactone. She is short of breath while talking to me during interview. I did state based on her current signs and symptoms, she should present back to ED, however she politely declines and would rather manage this outpatient. I discussed with her the risks regarding this,  and she verbalized understanding.  She was instructed by clinic RN Georgina Peer, RN) to take a Lasix tablet with her potassium before leaving the office today. She was also instructed to pick up her Aldactone from the pharmacy and to take that medication as well before the end of today. Consulted Dr. Carlyle Dolly and he agreed with the following regarding the plan of care: we will increase Lasix to 40 mg BID x 3 days, then returning to Lasix 40 mg daily, will obtain BMET in 1 week. Discussed with her about medication compliance. Wt log given today. Low sodium diet, fluid restriction <2L, and daily weights encouraged. Educated to contact our office for weight gain of 2 lbs overnight or 5 lbs in one week. Continue current medication regimen.   Mild AS Echo 07/2022 revealed mildly calcified aortic valve and mild aortic valve stenosis with mean gradient measuring 14.7 mmHg, aortic valve peak gradient measured at 28.9 mmHg. VTI measured 1.64 cm2. Grade 2/6 murmur noted on exam. Continue current medication regimen. Plan to update 2D echo in 1 year for monitoring.   Leg edema Stable, nonpitting edema bilaterally. Increasing Lasix and instructed her to start Aldactone, as mentioned above. Low salt, heart healthy diet and regular cardiovascular exercise encouraged. Discussed wearing compression stockings during the day.   HTN BP today on arrival 146/50, repeat BP 140/59. Discussed to monitor BP at home at least 2 hours after medications and sitting for 5-10 minutes. BP log given today and recommended to obtain omron cuff. Discussed to notify office if SBP is consistently > 140. Increasing lasix as mentioned above, and she has been instructed to begin taking Aldactone today, she verbalized understanding. Low salt, heart healthy diet and regular cardiovascular exercise encouraged.   5. Disposition: Follow up with me in 1 week or sooner if anything changes.    Medication Adjustments/Labs and Tests  Ordered: Current medicines are reviewed at length with the patient today.  Concerns regarding medicines are outlined above.  Orders Placed This Encounter  Procedures   Basic metabolic panel   Meds ordered this encounter  Medications   spironolactone (ALDACTONE) 25 MG tablet    Sig: Take 1 tablet (25 mg total) by mouth daily.    Dispense:  30 tablet    Refill:  6   furosemide (LASIX) 40 MG tablet    Sig: Take 1 tablet (40 mg total) by mouth 2 (two) times daily. For 3 days, then reduce to 40 mg daily    Dispense:  10 tablet    Refill:  0    Patient Instructions  Medication Instructions:  Your physician has recommended you make the following change in your medication:  Tomorrow (09/08/2022) start furosemide 40 mg twice daily for 3 days, then reduce to 40 mg daily Pick your spironolactone 25 mg prescription up from your pharmacy Continue other medications the same  Labwork: BMET (09/12/2022) [facility] ([place]) or your family doctor's office  Testing/Procedures: none  Follow-Up: Your physician recommends that you schedule a follow-up appointment in: next Wednesday 09/13/2022 '@9'$ :00 am  Any Other Special Instructions Will Be Listed Below (If Applicable). Your physician has requested that you regularly monitor and record your blood pressure  readings at home. Please use the same machine at the same time of day to check your readings and record them to bring to your follow-up visit. Your physician recommends that you weigh, daily, at the same time every day, and in the same amount of clothing. Please record your daily weights on the handout provided and bring it to your next appointment. [place] information  If you need a refill on your cardiac medications before your next appointment, please call your pharmacy.   Signed, Finis Bud, NP  09/09/2022 7:39 PM    [place]

## 2022-09-07 NOTE — Patient Instructions (Addendum)
Medication Instructions:  Your physician has recommended you make the following change in your medication:  Tomorrow (09/08/2022) start furosemide 40 mg twice daily for 3 days, then reduce to 40 mg daily Pick your spironolactone 25 mg prescription up from your pharmacy Continue other medications the same  Labwork: BMET (09/12/2022) [facility] ([place]) or your family doctor's office  Testing/Procedures: none  Follow-Up: Your physician recommends that you schedule a follow-up appointment in: next Wednesday 09/13/2022 '@9'$ :00 am  Any Other Special Instructions Will Be Listed Below (If Applicable). Your physician has requested that you regularly monitor and record your blood pressure readings at home. Please use the same machine at the same time of day to check your readings and record them to bring to your follow-up visit. Your physician recommends that you weigh, daily, at the same time every day, and in the same amount of clothing. Please record your daily weights on the handout provided and bring it to your next appointment. [place] information  If you need a refill on your cardiac medications before your next appointment, please call your pharmacy.

## 2022-09-08 ENCOUNTER — Telehealth: Payer: Self-pay | Admitting: *Deleted

## 2022-09-08 ENCOUNTER — Encounter: Payer: Self-pay | Admitting: *Deleted

## 2022-09-08 LAB — CBC WITH DIFFERENTIAL/PLATELET
Basophils Absolute: 0.1 10*3/uL (ref 0.0–0.2)
Basos: 1 %
EOS (ABSOLUTE): 0.2 10*3/uL (ref 0.0–0.4)
Eos: 4 %
Hematocrit: 35 % (ref 34.0–46.6)
Hemoglobin: 11.1 g/dL (ref 11.1–15.9)
Immature Grans (Abs): 0 10*3/uL (ref 0.0–0.1)
Immature Granulocytes: 0 %
Lymphocytes Absolute: 1 10*3/uL (ref 0.7–3.1)
Lymphs: 18 %
MCH: 30.2 pg (ref 26.6–33.0)
MCHC: 31.7 g/dL (ref 31.5–35.7)
MCV: 95 fL (ref 79–97)
Monocytes Absolute: 0.7 10*3/uL (ref 0.1–0.9)
Monocytes: 12 %
Neutrophils Absolute: 3.5 10*3/uL (ref 1.4–7.0)
Neutrophils: 65 %
Platelets: 215 10*3/uL (ref 150–450)
RBC: 3.68 x10E6/uL — ABNORMAL LOW (ref 3.77–5.28)
RDW: 12.4 % (ref 11.7–15.4)
WBC: 5.4 10*3/uL (ref 3.4–10.8)

## 2022-09-08 LAB — CMP14+EGFR
ALT: 28 IU/L (ref 0–32)
AST: 32 IU/L (ref 0–40)
Albumin/Globulin Ratio: 1.7 (ref 1.2–2.2)
Albumin: 3.8 g/dL (ref 3.7–4.7)
Alkaline Phosphatase: 70 IU/L (ref 44–121)
BUN/Creatinine Ratio: 15 (ref 12–28)
BUN: 20 mg/dL (ref 8–27)
Bilirubin Total: 0.4 mg/dL (ref 0.0–1.2)
CO2: 27 mmol/L (ref 20–29)
Calcium: 9.1 mg/dL (ref 8.7–10.3)
Chloride: 96 mmol/L (ref 96–106)
Creatinine, Ser: 1.35 mg/dL — ABNORMAL HIGH (ref 0.57–1.00)
Globulin, Total: 2.3 g/dL (ref 1.5–4.5)
Glucose: 156 mg/dL — ABNORMAL HIGH (ref 70–99)
Potassium: 3.9 mmol/L (ref 3.5–5.2)
Sodium: 136 mmol/L (ref 134–144)
Total Protein: 6.1 g/dL (ref 6.0–8.5)
eGFR: 38 mL/min/{1.73_m2} — ABNORMAL LOW (ref >59)

## 2022-09-08 LAB — TSH+FREE T4
Free T4: 1.41 ng/dL (ref 0.82–1.77)
TSH: 0.94 u[IU]/mL (ref 0.450–4.500)

## 2022-09-08 LAB — IRON,TIBC AND FERRITIN PANEL
Ferritin: 123 ng/mL (ref 15–150)
Iron Saturation: 19 % (ref 15–55)
Iron: 54 ug/dL (ref 27–139)
Total Iron Binding Capacity: 288 ug/dL (ref 250–450)
UIBC: 234 ug/dL (ref 118–369)

## 2022-09-08 LAB — BRAIN NATRIURETIC PEPTIDE: BNP: 543.6 pg/mL — ABNORMAL HIGH (ref 0.0–100.0)

## 2022-09-08 NOTE — Patient Outreach (Signed)
  Care Coordination [facility] Note Transition Care Management Follow-up Telephone Call Date of discharge and from where: 09/06/22 from [facility] How have you been since you were released from the hospital? I am feeling pretty good. Any questions or concerns? No  Items Reviewed: Did the pt receive and understand the discharge instructions provided? Yes  Medications obtained and verified? Yes  Other? Yes  Any new allergies since your discharge? No  Dietary orders reviewed? No Do you have support at home? Yes   Home Care and Equipment/Supplies: Were home health services ordered? no If so, what is the name of the agency? N/A  Has the agency set up a time to come to the patient's home? not applicable Were any new equipment or medical supplies ordered?  No What is the name of the medical supply agency? N/A Were you able to get the supplies/equipment? not applicable Do you have any questions related to the use of the equipment or supplies? No  Functional Questionnaire: (I = Independent and D = Dependent) ADLs: I  Bathing/Dressing- I  Meal Prep- I  Eating- I  Maintaining continence- I  Transferring/Ambulation- I  Managing Meds- I  Follow up appointments reviewed:  PCP Hospital f/u appt confirmed? Yes  Scheduled to see Dr. Moshe Cipro on 10/10/21 @ 1:00pm. [facility] f/u appt confirmed? Yes  Scheduled to see Cardiology on 12/21  Are transportation arrangements needed? No  If their condition worsens, is the pt aware to call PCP or go to the Emergency Dept.? Yes Was the patient provided with contact information for the PCP's office or ED? Yes Was to pt encouraged to call back with questions or concerns? Yes  SDOH assessments and interventions completed:   Yes SDOH Interventions Today    Flowsheet Row Most Recent Value  SDOH Interventions   Food Insecurity Interventions Intervention Not Indicated  Housing Interventions Intervention Not Indicated  Transportation Interventions  Intervention Not Indicated  Utilities Interventions Intervention Not Indicated       Goals Addressed             This Visit's Progress    I will follow the HF Action plan daily and call my provider if I have a wt gain, increased SOB or edema over the next 30 days.       Care Coordination Interventions: Basic overview and discussion of pathophysiology of Heart Failure reviewed Provided education on low sodium diet Reviewed Heart Failure Action Plan in depth and provided written copy Assessed need for readable accurate scales in home Advised patient to weigh each morning after emptying bladder Discussed importance of daily weight and advised patient to weigh and record daily Reviewed role of diuretics in prevention of fluid overload and management of heart failure; Screening for signs and symptoms of depression related to chronic disease state  Assessed social determinant of health barriers           Care Coordination Interventions:  No Care Coordination interventions needed at this time.   Encounter Outcome:  Pt. Visit Completed    Kayleen Memos C. Myrtie Neither, MSN, [facility] Gerontological Nurse Practitioner [facility] Care Management [phone]   HOME VISIT SCHEDULED FOR 09/14/22 AT 2:00PM

## 2022-09-12 ENCOUNTER — Encounter (HOSPITAL_COMMUNITY): Payer: Self-pay | Admitting: Emergency Medicine

## 2022-09-12 ENCOUNTER — Other Ambulatory Visit: Payer: Self-pay

## 2022-09-12 DIAGNOSIS — Z961 Presence of intraocular lens: Secondary | ICD-10-CM | POA: Diagnosis present

## 2022-09-12 DIAGNOSIS — Z79899 Other long term (current) drug therapy: Secondary | ICD-10-CM

## 2022-09-12 DIAGNOSIS — G4700 Insomnia, unspecified: Secondary | ICD-10-CM | POA: Diagnosis present

## 2022-09-12 DIAGNOSIS — Z888 Allergy status to other drugs, medicaments and biological substances status: Secondary | ICD-10-CM

## 2022-09-12 DIAGNOSIS — I350 Nonrheumatic aortic (valve) stenosis: Secondary | ICD-10-CM | POA: Diagnosis not present

## 2022-09-12 DIAGNOSIS — Z8249 Family history of ischemic heart disease and other diseases of the circulatory system: Secondary | ICD-10-CM | POA: Diagnosis not present

## 2022-09-12 DIAGNOSIS — Z885 Allergy status to narcotic agent status: Secondary | ICD-10-CM

## 2022-09-12 DIAGNOSIS — R0902 Hypoxemia: Secondary | ICD-10-CM | POA: Diagnosis not present

## 2022-09-12 DIAGNOSIS — Z86000 Personal history of in-situ neoplasm of breast: Secondary | ICD-10-CM

## 2022-09-12 DIAGNOSIS — Z833 Family history of diabetes mellitus: Secondary | ICD-10-CM | POA: Diagnosis not present

## 2022-09-12 DIAGNOSIS — Z794 Long term (current) use of insulin: Secondary | ICD-10-CM | POA: Diagnosis not present

## 2022-09-12 DIAGNOSIS — I511 Rupture of chordae tendineae, not elsewhere classified: Secondary | ICD-10-CM | POA: Diagnosis present

## 2022-09-12 DIAGNOSIS — E1122 Type 2 diabetes mellitus with diabetic chronic kidney disease: Secondary | ICD-10-CM | POA: Diagnosis present

## 2022-09-12 DIAGNOSIS — Z1152 Encounter for screening for COVID-19: Secondary | ICD-10-CM

## 2022-09-12 DIAGNOSIS — Z823 Family history of stroke: Secondary | ICD-10-CM | POA: Diagnosis not present

## 2022-09-12 DIAGNOSIS — I5033 Acute on chronic diastolic (congestive) heart failure: Secondary | ICD-10-CM | POA: Diagnosis not present

## 2022-09-12 DIAGNOSIS — R0609 Other forms of dyspnea: Secondary | ICD-10-CM | POA: Diagnosis not present

## 2022-09-12 DIAGNOSIS — R54 Age-related physical debility: Secondary | ICD-10-CM | POA: Diagnosis present

## 2022-09-12 DIAGNOSIS — N1832 Chronic kidney disease, stage 3b: Secondary | ICD-10-CM | POA: Diagnosis not present

## 2022-09-12 DIAGNOSIS — E785 Hyperlipidemia, unspecified: Secondary | ICD-10-CM | POA: Diagnosis not present

## 2022-09-12 DIAGNOSIS — Z83438 Family history of other disorder of lipoprotein metabolism and other lipidemia: Secondary | ICD-10-CM | POA: Diagnosis not present

## 2022-09-12 DIAGNOSIS — K219 Gastro-esophageal reflux disease without esophagitis: Secondary | ICD-10-CM | POA: Diagnosis not present

## 2022-09-12 DIAGNOSIS — R0602 Shortness of breath: Secondary | ICD-10-CM | POA: Diagnosis not present

## 2022-09-12 DIAGNOSIS — Z7982 Long term (current) use of aspirin: Secondary | ICD-10-CM

## 2022-09-12 DIAGNOSIS — I130 Hypertensive heart and chronic kidney disease with heart failure and stage 1 through stage 4 chronic kidney disease, or unspecified chronic kidney disease: Principal | ICD-10-CM | POA: Diagnosis present

## 2022-09-12 DIAGNOSIS — D631 Anemia in chronic kidney disease: Secondary | ICD-10-CM | POA: Diagnosis not present

## 2022-09-12 DIAGNOSIS — N183 Chronic kidney disease, stage 3 unspecified: Secondary | ICD-10-CM | POA: Diagnosis not present

## 2022-09-12 DIAGNOSIS — E1165 Type 2 diabetes mellitus with hyperglycemia: Secondary | ICD-10-CM | POA: Diagnosis present

## 2022-09-12 DIAGNOSIS — Z800 Family history of malignant neoplasm of digestive organs: Secondary | ICD-10-CM | POA: Diagnosis not present

## 2022-09-12 DIAGNOSIS — Z825 Family history of asthma and other chronic lower respiratory diseases: Secondary | ICD-10-CM

## 2022-09-12 DIAGNOSIS — F411 Generalized anxiety disorder: Secondary | ICD-10-CM | POA: Diagnosis present

## 2022-09-12 DIAGNOSIS — Z9842 Cataract extraction status, left eye: Secondary | ICD-10-CM

## 2022-09-12 DIAGNOSIS — I340 Nonrheumatic mitral (valve) insufficiency: Secondary | ICD-10-CM | POA: Diagnosis not present

## 2022-09-12 DIAGNOSIS — R0600 Dyspnea, unspecified: Secondary | ICD-10-CM | POA: Diagnosis not present

## 2022-09-12 DIAGNOSIS — I10 Essential (primary) hypertension: Secondary | ICD-10-CM | POA: Diagnosis not present

## 2022-09-12 LAB — CBC
HCT: 31.9 % — ABNORMAL LOW (ref 36.0–46.0)
Hemoglobin: 10.4 g/dL — ABNORMAL LOW (ref 12.0–15.0)
MCH: 30.4 pg (ref 26.0–34.0)
MCHC: 32.6 g/dL (ref 30.0–36.0)
MCV: 93.3 fL (ref 80.0–100.0)
Platelets: 205 10*3/uL (ref 150–400)
RBC: 3.42 MIL/uL — ABNORMAL LOW (ref 3.87–5.11)
RDW: 12.9 % (ref 11.5–15.5)
WBC: 6.1 10*3/uL (ref 4.0–10.5)
nRBC: 0 % (ref 0.0–0.2)

## 2022-09-12 LAB — BASIC METABOLIC PANEL
Anion gap: 9 (ref 5–15)
BUN: 30 mg/dL — ABNORMAL HIGH (ref 8–23)
CO2: 29 mmol/L (ref 22–32)
Calcium: 8.9 mg/dL (ref 8.9–10.3)
Chloride: 92 mmol/L — ABNORMAL LOW (ref 98–111)
Creatinine, Ser: 1.92 mg/dL — ABNORMAL HIGH (ref 0.44–1.00)
GFR, Estimated: 25 mL/min — ABNORMAL LOW (ref >60)
Glucose, Bld: 229 mg/dL — ABNORMAL HIGH (ref 70–99)
Potassium: 3.8 mmol/L (ref 3.5–5.1)
Sodium: 130 mmol/L — ABNORMAL LOW (ref 135–145)

## 2022-09-12 NOTE — ED Triage Notes (Signed)
Pt states she was discharged too soon from hospital, family states pt tires out easy now.

## 2022-09-13 ENCOUNTER — Other Ambulatory Visit (HOSPITAL_COMMUNITY): Payer: Self-pay | Admitting: *Deleted

## 2022-09-13 ENCOUNTER — Inpatient Hospital Stay (HOSPITAL_COMMUNITY)
Admission: EM | Admit: 2022-09-13 | Discharge: 2022-09-17 | DRG: 291 | Disposition: A | Discharge status: Home / Self Care | Payer: Medicare Other | Attending: Family Medicine | Admitting: Family Medicine

## 2022-09-13 ENCOUNTER — Ambulatory Visit: Payer: Medicare Other | Admitting: Nurse Practitioner

## 2022-09-13 ENCOUNTER — Emergency Department (HOSPITAL_COMMUNITY): Payer: Medicare Other

## 2022-09-13 ENCOUNTER — Observation Stay (HOSPITAL_BASED_OUTPATIENT_CLINIC_OR_DEPARTMENT_OTHER): Payer: Medicare Other

## 2022-09-13 DIAGNOSIS — I5033 Acute on chronic diastolic (congestive) heart failure: Secondary | ICD-10-CM

## 2022-09-13 DIAGNOSIS — I350 Nonrheumatic aortic (valve) stenosis: Secondary | ICD-10-CM

## 2022-09-13 DIAGNOSIS — I1A0 Resistant hypertension: Secondary | ICD-10-CM | POA: Diagnosis present

## 2022-09-13 DIAGNOSIS — K219 Gastro-esophageal reflux disease without esophagitis: Secondary | ICD-10-CM | POA: Diagnosis present

## 2022-09-13 DIAGNOSIS — I10 Essential (primary) hypertension: Secondary | ICD-10-CM

## 2022-09-13 DIAGNOSIS — E119 Type 2 diabetes mellitus without complications: Secondary | ICD-10-CM

## 2022-09-13 DIAGNOSIS — R0609 Other forms of dyspnea: Secondary | ICD-10-CM

## 2022-09-13 DIAGNOSIS — N1832 Chronic kidney disease, stage 3b: Secondary | ICD-10-CM | POA: Diagnosis present

## 2022-09-13 DIAGNOSIS — N1831 Chronic kidney disease, stage 3a: Secondary | ICD-10-CM

## 2022-09-13 DIAGNOSIS — R0602 Shortness of breath: Principal | ICD-10-CM

## 2022-09-13 DIAGNOSIS — E1122 Type 2 diabetes mellitus with diabetic chronic kidney disease: Secondary | ICD-10-CM

## 2022-09-13 DIAGNOSIS — I509 Heart failure, unspecified: Secondary | ICD-10-CM

## 2022-09-13 DIAGNOSIS — I340 Nonrheumatic mitral (valve) insufficiency: Secondary | ICD-10-CM | POA: Diagnosis not present

## 2022-09-13 DIAGNOSIS — N183 Chronic kidney disease, stage 3 unspecified: Secondary | ICD-10-CM | POA: Diagnosis not present

## 2022-09-13 DIAGNOSIS — Z794 Long term (current) use of insulin: Secondary | ICD-10-CM

## 2022-09-13 LAB — URINALYSIS, ROUTINE W REFLEX MICROSCOPIC
Bacteria, UA: NONE SEEN
Bilirubin Urine: NEGATIVE
Glucose, UA: >=500 mg/dL — AB
Hgb urine dipstick: NEGATIVE
Ketones, ur: 20 mg/dL — AB
Nitrite: NEGATIVE
Protein, ur: 100 mg/dL — AB
Specific Gravity, Urine: 1.006 (ref 1.005–1.030)
pH: 6 (ref 5.0–8.0)

## 2022-09-13 LAB — TROPONIN I (HIGH SENSITIVITY): Troponin I (High Sensitivity): 9 ng/L (ref <18)

## 2022-09-13 LAB — GLUCOSE, CAPILLARY
Glucose-Capillary: 501 mg/dL (ref 70–99)
Glucose-Capillary: 296 mg/dL — ABNORMAL HIGH (ref 70–99)

## 2022-09-13 LAB — RESP PANEL BY RT-PCR (RSV, FLU A&B, COVID)  RVPGX2
Influenza A by PCR: NEGATIVE
Influenza B by PCR: NEGATIVE
Resp Syncytial Virus by PCR: NEGATIVE
SARS Coronavirus 2 by RT PCR: NEGATIVE

## 2022-09-13 LAB — BASIC METABOLIC PANEL
BUN/Creatinine Ratio: 14 (ref 12–28)
BUN: 26 mg/dL (ref 8–27)
CO2: 29 mmol/L (ref 20–29)
Calcium: 9.4 mg/dL (ref 8.7–10.3)
Chloride: 94 mmol/L — ABNORMAL LOW (ref 96–106)
Creatinine, Ser: 1.82 mg/dL — ABNORMAL HIGH (ref 0.57–1.00)
Glucose: 210 mg/dL — ABNORMAL HIGH (ref 70–99)
Potassium: 4 mmol/L (ref 3.5–5.2)
Sodium: 136 mmol/L (ref 134–144)
eGFR: 27 mL/min/{1.73_m2} — ABNORMAL LOW (ref >59)

## 2022-09-13 LAB — ECHOCARDIOGRAM COMPLETE
AR max vel: 1.62 cm2
AV Area VTI: 1.95 cm2
AV Area mean vel: 1.9 cm2
AV Mean grad: 20.7 mmHg
AV Peak grad: 41.3 mmHg
Ao pk vel: 3.21 m/s
Area-P 1/2: 3.99 cm2
MV VTI: 3.68 cm2
S' Lateral: 1.7 cm

## 2022-09-13 LAB — BRAIN NATRIURETIC PEPTIDE: B Natriuretic Peptide: 710 pg/mL — ABNORMAL HIGH (ref 0.0–100.0)

## 2022-09-13 MED ORDER — FUROSEMIDE 10 MG/ML IJ SOLN
40.0000 mg | Freq: Once | INTRAMUSCULAR | Status: AC
  Administered 2022-09-13: 40 mg via INTRAVENOUS
  Filled 2022-09-13: qty 4

## 2022-09-13 MED ORDER — SODIUM CHLORIDE 0.9% FLUSH: 3.0000 mL | INTRAVENOUS | Status: DC | PRN

## 2022-09-13 MED ORDER — AMLODIPINE BESYLATE 5 MG PO TABS
10.0000 mg | ORAL_TABLET | Freq: Every day | ORAL | Status: DC
  Administered 2022-09-14 – 2022-09-17 (×4): 10 mg via ORAL
  Filled 2022-09-13 (×4): qty 2

## 2022-09-13 MED ORDER — ROSUVASTATIN CALCIUM 10 MG PO TABS
5.0000 mg | ORAL_TABLET | Freq: Every day | ORAL | Status: DC
  Administered 2022-09-13 – 2022-09-17 (×5): 5 mg via ORAL
  Filled 2022-09-13 (×6): qty 1

## 2022-09-13 MED ORDER — INSULIN ASPART 100 UNIT/ML IJ SOLN
24.0000 [IU] | Freq: Once | INTRAMUSCULAR | Status: AC
  Administered 2022-09-13: 24 [IU] via SUBCUTANEOUS

## 2022-09-13 MED ORDER — INSULIN ASPART 100 UNIT/ML IJ SOLN
0.0000 [IU] | Freq: Every day | INTRAMUSCULAR | Status: DC
  Administered 2022-09-13: 3 [IU] via SUBCUTANEOUS

## 2022-09-13 MED ORDER — INSULIN GLARGINE-YFGN 100 UNIT/ML ~~LOC~~ SOLN
12.0000 [IU] | Freq: Once | SUBCUTANEOUS | Status: DC
  Filled 2022-09-13: qty 0.12

## 2022-09-13 MED ORDER — FUROSEMIDE 10 MG/ML IJ SOLN
40.0000 mg | Freq: Two times a day (BID) | INTRAMUSCULAR | Status: DC
  Administered 2022-09-13 – 2022-09-17 (×8): 40 mg via INTRAVENOUS
  Filled 2022-09-13 (×8): qty 4

## 2022-09-13 MED ORDER — ACETAMINOPHEN 650 MG RE SUPP: 650.0000 mg | Freq: Four times a day (QID) | RECTAL | Status: DC | PRN

## 2022-09-13 MED ORDER — INSULIN ASPART 100 UNIT/ML IJ SOLN
0.0000 [IU] | Freq: Three times a day (TID) | INTRAMUSCULAR | Status: DC
  Administered 2022-09-14: 2 [IU] via SUBCUTANEOUS
  Administered 2022-09-14: 11 [IU] via SUBCUTANEOUS
  Administered 2022-09-15: 5 [IU] via SUBCUTANEOUS
  Administered 2022-09-15 (×2): 3 [IU] via SUBCUTANEOUS
  Administered 2022-09-16: 5 [IU] via SUBCUTANEOUS
  Administered 2022-09-16: 8 [IU] via SUBCUTANEOUS
  Administered 2022-09-16: 2 [IU] via SUBCUTANEOUS
  Administered 2022-09-17: 5 [IU] via SUBCUTANEOUS
  Administered 2022-09-17: 8 [IU] via SUBCUTANEOUS

## 2022-09-13 MED ORDER — SODIUM CHLORIDE 0.9 % IV SOLN
1.0000 g | INTRAVENOUS | Status: AC
  Administered 2022-09-13 – 2022-09-15 (×3): 1 g via INTRAVENOUS
  Filled 2022-09-13 (×3): qty 10

## 2022-09-13 MED ORDER — POLYETHYLENE GLYCOL 3350 17 G PO PACK: 17.0000 g | PACK | Freq: Every day | ORAL | Status: DC | PRN

## 2022-09-13 MED ORDER — SODIUM CHLORIDE 0.9% FLUSH
3.0000 mL | Freq: Two times a day (BID) | INTRAVENOUS | Status: DC
  Administered 2022-09-14 – 2022-09-17 (×4): 3 mL via INTRAVENOUS

## 2022-09-13 MED ORDER — HEPARIN SODIUM (PORCINE) 5000 UNIT/ML IJ SOLN
5000.0000 [IU] | Freq: Three times a day (TID) | INTRAMUSCULAR | Status: DC
  Administered 2022-09-13 – 2022-09-16 (×8): 5000 [IU] via SUBCUTANEOUS
  Filled 2022-09-13 (×8): qty 1

## 2022-09-13 MED ORDER — ASPIRIN 81 MG PO TBEC
81.0000 mg | DELAYED_RELEASE_TABLET | Freq: Every day | ORAL | Status: DC
  Administered 2022-09-13 – 2022-09-16 (×4): 81 mg via ORAL
  Filled 2022-09-13 (×5): qty 1

## 2022-09-13 MED ORDER — VITAMIN D 25 MCG (1000 UNIT) PO TABS
2000.0000 [IU] | ORAL_TABLET | Freq: Every day | ORAL | Status: DC
  Administered 2022-09-13 – 2022-09-17 (×5): 2000 [IU] via ORAL
  Filled 2022-09-13 (×5): qty 2

## 2022-09-13 MED ORDER — TRAZODONE HCL 50 MG PO TABS: 50.0000 mg | ORAL_TABLET | Freq: Every evening | ORAL | Status: DC | PRN

## 2022-09-13 MED ORDER — SODIUM CHLORIDE 0.9% FLUSH
3.0000 mL | Freq: Two times a day (BID) | INTRAVENOUS | Status: DC
  Administered 2022-09-13 – 2022-09-17 (×7): 3 mL via INTRAVENOUS

## 2022-09-13 MED ORDER — HYDRALAZINE HCL 20 MG/ML IJ SOLN
10.0000 mg | Freq: Four times a day (QID) | INTRAMUSCULAR | Status: DC | PRN
  Administered 2022-09-16: 10 mg via INTRAVENOUS
  Filled 2022-09-13: qty 1

## 2022-09-13 MED ORDER — ONDANSETRON HCL 4 MG PO TABS: 4.0000 mg | ORAL_TABLET | Freq: Four times a day (QID) | ORAL | Status: DC | PRN

## 2022-09-13 MED ORDER — SPIRONOLACTONE 25 MG PO TABS
25.0000 mg | ORAL_TABLET | Freq: Every day | ORAL | Status: DC
  Administered 2022-09-13 – 2022-09-17 (×5): 25 mg via ORAL
  Filled 2022-09-13 (×5): qty 1

## 2022-09-13 MED ORDER — SODIUM CHLORIDE 0.9 % IV SOLN: INTRAVENOUS | Status: DC | PRN

## 2022-09-13 MED ORDER — ACETAMINOPHEN 325 MG PO TABS: 650.0000 mg | ORAL_TABLET | Freq: Four times a day (QID) | ORAL | Status: DC | PRN

## 2022-09-13 MED ORDER — ONDANSETRON HCL 4 MG/2ML IJ SOLN: 4.0000 mg | Freq: Four times a day (QID) | INTRAMUSCULAR | Status: DC | PRN

## 2022-09-13 MED ORDER — BISACODYL 10 MG RE SUPP: 10.0000 mg | Freq: Every day | RECTAL | Status: DC | PRN

## 2022-09-13 MED ORDER — CARVEDILOL 12.5 MG PO TABS
12.5000 mg | ORAL_TABLET | Freq: Two times a day (BID) | ORAL | Status: DC
  Administered 2022-09-13 – 2022-09-17 (×8): 12.5 mg via ORAL
  Filled 2022-09-13 (×8): qty 1

## 2022-09-13 MED ORDER — POTASSIUM CHLORIDE CRYS ER 20 MEQ PO TBCR
20.0000 meq | EXTENDED_RELEASE_TABLET | Freq: Every day | ORAL | Status: DC
  Administered 2022-09-13 – 2022-09-17 (×5): 20 meq via ORAL
  Filled 2022-09-13 (×5): qty 1

## 2022-09-13 MED ORDER — INSULIN GLARGINE-YFGN 100 UNIT/ML ~~LOC~~ SOLN
12.0000 [IU] | Freq: Every day | SUBCUTANEOUS | Status: DC
  Administered 2022-09-13 – 2022-09-17 (×5): 12 [IU] via SUBCUTANEOUS
  Filled 2022-09-13 (×6): qty 0.12

## 2022-09-13 MED ORDER — AMLODIPINE BESYLATE 5 MG PO TABS
10.0000 mg | ORAL_TABLET | Freq: Once | ORAL | Status: AC
  Administered 2022-09-13: 10 mg via ORAL
  Filled 2022-09-13: qty 2

## 2022-09-13 NOTE — ED Notes (Signed)
Pt SpO2 while ambulating on RA 93%, while sitting, she was at 89%

## 2022-09-13 NOTE — ED Notes (Signed)
Lunch tray given. 

## 2022-09-13 NOTE — Progress Notes (Deleted)
Cardiology Office Note:    Date:  09/07/2022  ID:  Emily Jennings, DOB [birth year], MRN [id number]  PCP:  [name], [facility] Cardiologist:  Carlyle Dolly, MD     Referring MD: [name], MD   CC: Here for Hospital follow-up  History of Present Illness:    Emily Jennings is a 88 y.o. female with a hx of the following:  Cardiovascular disease HFpEF Type 2 diabetes GERD Stage III CKD Hyperlipidemia History of syncope and collapse Leg edema Murmur Dyspnea on exertion Mild AS  Patient is a 88 year old female with past medical history as mentioned above.  She first saw Dr. Carlyle Dolly on July 20, 2022.  She was overall doing well from a cardiac perspective.  Was compliant with her medications.  Had noted some mild DOE, denied any recent swelling.  Blood pressure was elevated in office that day systolic BP was around 563O.  Echocardiogram was arranged for heart murmur found on exam. Aldactone was lowered to 25 mg daily and carvedilol was increased to 18.75 mg twice daily due to history of chronic kidney disease stage IV and to help with mildly elevated BP in office.  Echocardiogram revealed normal EF, no RWMA, moderate LVH, elevated LAP, normal PASP, moderately dilated left atrium, probable ruptured chordae attached to anterior MV leaflet, trivial MR, mildly calcified aortic valve, mild aortic stenosis with mean gradient measuring 14.7 mmHg, no other significant valvular abnormalities.  Was told to follow-up in 6 months.  Most recently, she had recent admission on September 05, 2022 due to worsening shortness of breath, orthopnea.  She had seen her PCP was found to have edema, this was new over the course of 3 weeks, was found to have crackles at the bases so CHF was suspected, PCP sent her to the ED.  She had no other complaints besides exertional shortness of breath and orthopnea.  CXR revealed vascular congestion.  Was started on IV  Lasix.  She diuresed well, weight came back down to baseline.  Symptoms improved.  She was discharged on Lasix 40 mg daily, Aldactone 25 mg daily, and would resume previous home BP medicines.  It was suggested she follow-up with cardiology and her PCP.  Today patient presents for follow-up.  She states she is "not as badly winded," however she walks about 30 feet and finds herself gasping for breath. Brings in her pills bottles and I reviewed them with her to see what she is taking. Says she hasn't taken Lasix yet nor has she taken Aldactone. Says she was supposed to receive another dose of IV Lasix prior to d/c however she was d/c early that day. Denies any chest pain, worsening shortness of breath, palpitations, syncope, presyncope, dizziness, orthopnea, PND, acute bleeding, claudication.  States she has had a chronic swelling along lower extremity edema.  Denies any other questions or concerns today.  Past Medical History:  Diagnosis Date   Allergy    Anxiety    ANXIETY DISORDER, GENERALIZED 07/15/2007   Qualifier: Diagnosis of  By: Rennie Plowman     Arthritis    Cancer [facility]) 2009   breast, carcinoma in situ left   Carcinoma in situ of breast 05/21/2008   Qualifier: Diagnosis of  By: Moshe Cipro MD, Margaret  Diagnosed in 2009, completed 5 year course of tamoxifen, no evidence of recurrence    Carotid stenosis    11/16/2005  mild plaque formation and stenosis proximal right ECA  Cataract    Complication of anesthesia    Coronary artery disease    cardiac catheterization on 03/20/2006  LAD mid 40% stenosis, left circumflex mild 40% stenosis, RCA mid-vessel 40% to 50% lesion   EF 60%   Diabetes mellitus    GERD (gastroesophageal reflux disease)    Hernia, inguinal    left   Hyperglycemia    Hypertension    Insomnia 11/16/2011   Low blood potassium    Non-insulin dependent type 2 diabetes mellitus ([place])    Osteoporosis    Shortness of breath    2D Echocardiogram 01/26/2009   EF of greater  than 55%, mild MR, mild TR, normal ventricular function   Thickened endometrium 10/26/2017   Noted by gyne in 2017, missed 6 month follow up, referred in 09/2017   Ventricular tachycardia, non-sustained ([place])    developed during stress test 02/08/2006, spontaneously aborted, mild reversible apical defect    Past Surgical History:  Procedure Laterality Date   BREAST LUMPECTOMY Left 2009   Left breast 2009   CATARACT EXTRACTION W/PHACO Left 10/28/2014   Procedure: PHACO EMULSION CATARACT EXTRACTION WITH INTRAOCULAR LENS IMPLANT LEFT EYE ([place]);  Surgeon: Marylynn Pearson, MD;  Location: [place];  Service: Ophthalmology;  Laterality: Left;   COLONOSCOPY     cyst removed from left foot     REFRACTIVE SURGERY Left     Current Medications: Current Meds  Medication Sig   acetaminophen (TYLENOL) 500 MG tablet Take one tablet two times daily for 3 days, then as needed   alendronate (FOSAMAX) 70 MG tablet TAKE 1 TABLET BY MOUTH EVERY 7 DAYS. TAKE WITH A FULL GLASS OF WATER ON AN EMPTY STOMACH (Patient taking differently: Take 70 mg by mouth once a week. TAKE 1 TABLET BY MOUTH EVERY 7 DAYS. TAKE WITH A FULL GLASS OF WATER ON AN EMPTY STOMACH)   amLODipine-olmesartan (AZOR) 10-40 MG tablet TAKE 1 TABLET BY MOUTH EVERY DAY   aspirin EC 81 MG tablet Take 81 mg by mouth daily.   carvedilol (COREG) 12.5 MG tablet Take 1.5 tablets (18.75 mg total) by mouth in the morning and at bedtime.   cholecalciferol (VITAMIN D3) 25 MCG (1000 UT) tablet Take 2,000 Units by mouth daily.   insulin lispro protamine-lispro (HUMALOG MIX 75/25) (75-25) 100 UNIT/ML SUSP injection Inject 10-15 Units into the skin See admin instructions. 10 units bid if BG above 90 15 units  if it is over 300   potassium chloride SA (KLOR-CON M) 20 MEQ tablet Take 1 tablet (20 mEq total) by mouth daily.   rosuvastatin (CRESTOR) 5 MG tablet TAKE 1 TABLET (5 MG TOTAL) BY MOUTH DAILY. (Patient taking differently: Take 5 mg by mouth daily.)    furosemide  (LASIX) 40 MG tablet Take 1 tablet (40 mg total) by mouth daily.     Allergies:   Benadryl [diphenhydramine hcl], Citalopram, Metformin and related, Tramadol, and Crestor [rosuvastatin]   Social History   Socioeconomic History   Marital status: Married    Spouse name: Not on file   Number of children: 2   Years of education: na   Highest education level: Some college, no degree  Occupational History   Occupation: retired    Fish farm manager: retired  Tobacco Use   Smoking status: Never    Passive exposure: Never   Smokeless tobacco: Never  Vaping Use   Vaping Use: Never used  Substance and Sexual Activity   Alcohol use: No   Drug use: No  Sexual activity: Not Currently    Birth control/protection: Post-menopausal  Other Topics Concern   Not on file  Social History Narrative   Lives with her husband and son and attends yoga at the [employer] 2 days a week and speaks to her grandson nightly on the phone   Social Determinants of Health   Financial Resource Strain: Low Risk  (11/14/2021)   Overall Financial Resource Strain (CARDIA)    Difficulty of Paying Living Expenses: Not hard at all  Food Insecurity: No Food Insecurity (09/08/2022)   Hunger Vital Sign    Worried About Running Out of Food in the Last Year: Never true    [place] in the Last Year: Never true  Transportation Needs: No Transportation Needs (09/08/2022)   PRAPARE - Hydrologist (Medical): No    Lack of Transportation (Non-Medical): No  Physical Activity: Insufficiently Active (11/14/2021)   Exercise Vital Sign    Days of Exercise per Week: 7 days    Minutes of Exercise per Session: 20 min  Stress: No Stress Concern Present (11/14/2021)   [place]    Feeling of Stress : Only a little  Social Connections: Moderately Integrated (11/14/2021)   Social Connection and Isolation Panel [NHANES]    Frequency of Communication  with Friends and Family: More than three times a week    Frequency of Social Gatherings with Friends and Family: Once a week    Attends Religious Services: More than 4 times per year    Active Member of [employer] or Organizations: No    Attends Music therapist: Never    Marital Status: Married     Family History: The patient's family history includes Arthritis in an other family member; Asthma in an other family member; Cancer in her father; Colon cancer in her father and paternal aunt; Diabetes in an other family member; Heart disease (age of onset: 16) in her brother; Heart disease (age of onset: 79) in her brother; Hyperlipidemia in her mother; Hypertension in her mother; Stroke in her mother; Urticaria in her mother. There is no history of Esophageal cancer, Stomach cancer, or Rectal cancer.  ROS:   Review of Systems  Constitutional: Negative.   HENT: Negative.    Eyes: Negative.   Respiratory:  Positive for shortness of breath. Negative for cough, hemoptysis, sputum production and wheezing.   Cardiovascular:  Positive for leg swelling. Negative for chest pain, palpitations, orthopnea, claudication and PND.  Gastrointestinal: Negative.   Genitourinary: Negative.   Musculoskeletal: Negative.   Skin: Negative.   Neurological: Negative.   Endo/Heme/Allergies: Negative.   Psychiatric/Behavioral: Negative.      Please see the history of present illness.    All other systems reviewed and are negative.  EKGs/Labs/Other Studies Reviewed:    The following studies were reviewed today:   EKG:  EKG is not ordered today.    1 view chest x-ray on September 06, 2022: Cardiomegaly with signs of pulmonary edema.  Correlate with evidence of heart failure or volume overload.  2 view CXR on 09/05/2022: Findings most consistent with congestive heart failure and developing interstitial edema.   2D echocardiogram on August 01, 2022:  1. Left ventricular ejection fraction, by  estimation, is 60 to 65%. The  left ventricle has normal function. The left ventricle has no regional  wall motion abnormalities. There is moderate left ventricular hypertrophy.  Left ventricular diastolic  parameters are indeterminate.  Elevated left atrial pressure.   2. Right ventricular systolic function is normal. The right ventricular  size is normal. There is normal pulmonary artery systolic pressure.   3. Left atrial size was moderately dilated.   4. Probable ruptured chordae attached to anterior MV leaflet. . The  mitral valve is normal in structure. Trivial mitral valve regurgitation.  No evidence of mitral stenosis.   5. The tricuspid valve is abnormal.   6. The aortic valve is tricuspid. There is mild calcification of the  aortic valve. There is mild thickening of the aortic valve. Aortic valve  regurgitation is not visualized. Mild aortic valve stenosis. Aortic valve  mean gradient measures 14.7 mmHg.  Aortic valve peak gradient measures 28.9 mmHg. Aortic valve area, by VTI  measures 1.64 cm.   7. The inferior vena cava is normal in size with greater than 50%  respiratory variability, suggesting right atrial pressure of 3 mmHg.  Bilateral carotid duplex on Jan 24, 2021: Minor carotid atherosclerosis. No hemodynamically significant ICA stenosis. Degree of narrowing less than 50% bilaterally by ultrasound criteria.   Patent antegrade vertebral flow bilaterally  Recent Labs: 09/05/2022: ALT 28; B Natriuretic Peptide 848.0; TSH 0.678 09/06/2022: Magnesium 2.1 09/12/2022: BUN 30; Creatinine, Ser 1.92; Hemoglobin 10.4; Platelets 205; Potassium 3.8; Sodium 130  Recent Lipid Panel    Component Value Date/Time   CHOL 192 11/24/2021 0837   TRIG 43 11/24/2021 0837   HDL 118 11/24/2021 0837   CHOLHDL 1.6 11/24/2021 0837   CHOLHDL 1.8 06/29/2020 0916   VLDL 8 10/31/2016 0845   LDLCALC 65 11/24/2021 0837   LDLCALC 80 06/29/2020 0916    Physical Exam:    VS:  There were no  vitals taken for this visit.    Wt Readings from Last 3 Encounters:  09/12/22 132 lb 4.4 oz (60 kg)  09/07/22 132 lb 9.6 oz (60.1 kg)  09/05/22 133 lb 6.1 oz (60.5 kg)     GEN: Well nourished, well developed in no acute distress HEENT: Normal NECK: No JVD; No carotid bruits CARDIAC: S1/S2, Grade 2/6 noted along RSB and LSB,, no rubs or gallops noted, 2+ peripheral pulses throughout, strong bilaterally RESPIRATORY:  Coarse and diminshed to auscultation without rales, wheezing or rhonchi, short of breath with exertion, labored breathing with speaking during interview.  MUSCULOSKELETAL:  nonpitting edema along BLE; No deformity  SKIN: Warm and dry NEUROLOGIC:  Alert and oriented x 3 PSYCHIATRIC:  Normal affect   ASSESSMENT:    No diagnosis found.  PLAN:    In order of problems listed above:  Acute on chronic HFpEF exacerbation, medication management, shortness of breath, DOE Recently hospitalized for suspected CHF exacerbation. Most revealed echo revealed EF normal, mild AS, moderate LVH, normal PASP. CXR yesterday showed cardiomegaly with signs of pulmonary edema, this correlated with evidence of heart failure or volume overload. I suspect patient was d/c too early based on what she tells me. She states she did not receive a dose of IV Lasix she was supposed to receive prior to discharge. She has not taken her Lasix PO medication for today, nor has she taken Aldactone. She is short of breath while talking to me during interview. I did state based on her current signs and symptoms, she should present back to ED, however she politely declines and would rather manage this outpatient. I discussed with her the risks regarding this, and she verbalized understanding.  She was instructed by clinic RN Georgina Peer, RN) to take a Lasix  tablet with her potassium before leaving the office today. She was also instructed to pick up her Aldactone from the pharmacy and to take that medication as well  before the end of today. Consulted Dr. Carlyle Dolly and he agreed with the following regarding the plan of care: we will increase Lasix to 40 mg BID x 3 days, then returning to Lasix 40 mg daily, will obtain BMET in 1 week. Discussed with her about medication compliance. Wt log given today. Low sodium diet, fluid restriction <2L, and daily weights encouraged. Educated to contact our office for weight gain of 2 lbs overnight or 5 lbs in one week. Continue current medication regimen.   Mild AS Echo 07/2022 revealed mildly calcified aortic valve and mild aortic valve stenosis with mean gradient measuring 14.7 mmHg, aortic valve peak gradient measured at 28.9 mmHg. VTI measured 1.64 cm2. Grade 2/6 murmur noted on exam. Continue current medication regimen. Plan to update 2D echo in 1 year for monitoring.   Leg edema Stable, nonpitting edema bilaterally. Increasing Lasix and instructed her to start Aldactone, as mentioned above. Low salt, heart healthy diet and regular cardiovascular exercise encouraged. Discussed wearing compression stockings during the day.   HTN BP today on arrival 146/50, repeat BP 140/59. Discussed to monitor BP at home at least 2 hours after medications and sitting for 5-10 minutes. BP log given today and recommended to obtain omron cuff. Discussed to notify office if SBP is consistently > 140. Increasing lasix as mentioned above, and she has been instructed to begin taking Aldactone today, she verbalized understanding. Low salt, heart healthy diet and regular cardiovascular exercise encouraged.   5. Disposition: Follow up with me in 1 week or sooner if anything changes.    Medication Adjustments/Labs and Tests Ordered: Current medicines are reviewed at length with the patient today.  Concerns regarding medicines are outlined above.  No orders of the defined types were placed in this encounter.  No orders of the defined types were placed in this encounter.   There are no  Patient Instructions on file for this visit.   Signed, Finis Bud, NP  09/13/2022 4:58 AM    [place]

## 2022-09-13 NOTE — Progress Notes (Signed)
*  PRELIMINARY RESULTS* Echocardiogram 2D Echocardiogram has been performed.  Emily Jennings 09/13/2022, 2:18 PM

## 2022-09-13 NOTE — H&P (Signed)
Patient Demographics:    Emily Jennings, is a 88 y.o. female  MRN: [id number]   DOB - 1933-12-14  Admit Date - 09/13/2022  Outpatient Primary MD for the patient is [name], MD   Assessment & Plan:   Assessment and Plan:  1)Acute on chronic diastolic dysfunction CHF--patient presenting with elevated BNP, shortness of breath, increasing lower extremity edema and weight gain... 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 -No significant mitral stenosis noted -Cardiology consult appreciated -PTA patient abruptly stopped and Lasix around 09/10/2022 -Cardiology recommends restarting IV Lasix at 40 mg twice daily, can sinew Aldactone -REDs Vest -Daily weight and fluid input and output charting  2)DM2-A1c 7.4 reflecting uncontrolled diabetes with hyperglycemia -Glucose is above 500 at this time -Give Lantus insulin x 1 dose -Use Novolog/Humalog Sliding scale insulin with Accu-Cheks/Fingersticks as ordered   3)HTN-somewhat elevated at this time- -hold olmesartan -RestartAmlodipine and Coreg  may use IV Hydralazine 10 mg  Every 4 hours Prn for systolic blood pressure over 160 mmhg  4)HLD-continue Crestor  5)  CKD stage - 3B -Watch renal function closely especially with aggressive diuresis - renally adjust medications, avoid nephrotoxic agents / dehydration  / hypotension  6) chronic anemia--probably related to underlying CKD -No bleeding concerns -Hold off on ESA/Procrit -Recheck  CBC in a.m.  Status is: Inpatient  Dispo: The patient is from: Home              Anticipated d/c is to: Home with [facility]               Anticipated d/c date is: 2 days              Patient currently is not medically stable to d/c. Barriers: Not Clinically Stable-    With History of - Reviewed by me  Past Medical History:  Diagnosis Date   Allergy    Anxiety    ANXIETY DISORDER, GENERALIZED 07/15/2007   Qualifier: Diagnosis of  By: Rennie Plowman     Arthritis    Cancer [facility]) 2009   breast, carcinoma in situ left   Carcinoma in situ of breast 05/21/2008   Qualifier: Diagnosis of  By: Moshe Cipro MD, Margaret  Diagnosed in 2009, completed 5 year course of tamoxifen, no evidence of recurrence    Carotid stenosis    11/16/2005  mild plaque formation and stenosis proximal right ECA   Cataract    Complication of anesthesia    Coronary artery disease    cardiac catheterization on 03/20/2006  LAD mid 40% stenosis, left circumflex mild 40% stenosis, RCA mid-vessel 40% to 50% lesion  EF 60%   Diabetes mellitus    GERD (gastroesophageal reflux disease)    Hernia, inguinal    left   Hyperglycemia    Hypertension    Insomnia 11/16/2011   Low blood potassium    Non-insulin dependent type 2 diabetes mellitus ([place])    Osteoporosis    Shortness of breath    2D Echocardiogram 01/26/2009   EF of greater than 55%, mild MR, mild TR, normal ventricular function   Thickened endometrium 10/26/2017   Noted by gyne in 2017, missed 6 month follow up, referred in 09/2017   Ventricular tachycardia, non-sustained ([place])    developed during stress test 02/08/2006, spontaneously aborted, mild reversible apical defect      Past Surgical History:  Procedure Laterality Date   BREAST LUMPECTOMY Left 2009   Left breast 2009   CATARACT EXTRACTION W/PHACO Left 10/28/2014   Procedure: PHACO EMULSION CATARACT EXTRACTION WITH INTRAOCULAR LENS IMPLANT LEFT EYE ([place]);  Surgeon: Marylynn Pearson, MD;  Location: [place];   Service: Ophthalmology;  Laterality: Left;   COLONOSCOPY     cyst removed from left foot     REFRACTIVE SURGERY Left     Chief Complaint  Patient presents with   Weakness      HPI:    Emily Jennings  is a 88 y.o. female  with medical history significant of hypertension, type 2 diabetes mellitus, mild aortic stenosis, CKD stage IIIb, GERD , chronic diastolic dysfunction CHF patient discharged from inpatient service on 09/06/2022 after treatment for CHF this admission with diuretics -Postdischarge it appears patient was not taking her diuretics, patient was seen as outpatient on 09/07/2022 and advised to take Lasix and Aldactone, she took it for a couple of days but stopped taking it on 09/10/2022 because she felt like she was not voiding enough -She started drinking a lot of water thinking that she needed to drink more water so she can void -Became more short of breath with increasing orthopnea and dyspnea on exertion and increasing leg edema -In the ED chest x-ray without acute cardiopulmonary findings -Patient denies chest pains -UA with possible UTI -Patient is COVID, flu and RSV negative -Troponin is WNL at 9, -BNP is elevated at 710 -EKG requested and pending   Review of systems:    In addition to the HPI above,   A full Review of  Systems was done, all other systems reviewed are negative except as noted above in HPI     Social History:  Reviewed by me    Social History   Tobacco Use   Smoking status: Never    Passive exposure: Never   Smokeless tobacco: Never  Substance Use Topics   Alcohol use: No      Family History :  Reviewed by me    Family History  Problem Relation Age of Onset   Hypertension Mother    Hyperlipidemia Mother    Stroke Mother    Urticaria Mother    Cancer Father        pancreatic   Colon cancer Father    Heart disease Brother 35       bypass   Heart disease Brother 73       bypass   Arthritis Other    Asthma Other    Diabetes  Other    Colon cancer Paternal Aunt    Esophageal cancer Neg Hx    Stomach cancer Neg Hx    Rectal cancer Neg Hx  Home Medications:   Prior to Admission medications   Medication Sig Start Date End Date Taking? Authorizing Provider  acetaminophen (TYLENOL) 500 MG tablet Take one tablet two times daily for 3 days, then as needed 08/27/18  Yes [name], MD  alendronate (FOSAMAX) 70 MG tablet TAKE 1 TABLET BY MOUTH EVERY 7 DAYS. TAKE WITH A FULL GLASS OF WATER ON AN EMPTY STOMACH Patient taking differently: Take 70 mg by mouth once a week. TAKE 1 TABLET BY MOUTH EVERY 7 DAYS. TAKE WITH A FULL GLASS OF WATER ON AN EMPTY STOMACH 01/04/22  Yes [name], MD  amLODipine-olmesartan (AZOR) 10-40 MG tablet TAKE 1 TABLET BY MOUTH EVERY DAY 07/20/22  Yes [name], MD  aspirin EC 81 MG tablet Take 81 mg by mouth daily.   Yes [provider]  carvedilol (COREG) 12.5 MG tablet Take 1.5 tablets (18.75 mg total) by mouth in the morning and at bedtime. 07/20/22  Yes BranchAlphonse Guild, MD  cholecalciferol (VITAMIN D3) 25 MCG (1000 UT) tablet Take 2,000 Units by mouth daily.   Yes [provider]  furosemide (LASIX) 40 MG tablet Take 1 tablet (40 mg total) by mouth 2 (two) times daily. For 3 days, then reduce to 40 mg daily 09/07/22  Yes Finis Bud, NP  insulin lispro protamine-lispro (HUMALOG MIX 75/25) (75-25) 100 UNIT/ML SUSP injection Inject 10-15 Units into the skin See admin instructions. 10 units bid if BG above 90 15 units  if it is over 300 09/06/22  Yes Johnson, Clanford L, MD  potassium chloride SA (KLOR-CON M) 20 MEQ tablet Take 1 tablet (20 mEq total) by mouth daily. 09/06/22  Yes Johnson, Clanford L, MD  rosuvastatin (CRESTOR) 5 MG tablet TAKE 1 TABLET (5 MG TOTAL) BY MOUTH DAILY. Patient taking differently: Take 5 mg by mouth daily. 01/19/22  Yes [name], MD  blood glucose meter kit and supplies KIT One touch Ultra. Use three times  daily as directed. (FOR ICD E11.65) 10/27/20   Cassandria Anger, MD  glucose blood (ONETOUCH ULTRA) test strip USE TO TEST 2 TIMES A DAY 07/17/22   Cassandria Anger, MD  Insulin Pen Needle 32G X 4 MM MISC 1 each by Does not apply route as directed. 01/22/20   Cassandria Anger, MD  Insulin Syringe-Needle U-100 (BD INSULIN SYRINGE U/F) 31G X 5/16" 0.5 ML MISC USE AS DIRECTED TWICE DAILY 01/26/20   [name], [name], MD  mupirocin ointment (BACTROBAN) 2 % Apply topically 2 (two) times daily. Patient not taking: Reported on 09/08/2022 11/23/21   [name], MD  OneTouch Delica Lancets 67T MISC 1 each by Does not apply route 3 (three) times daily. Use to check glucose three times daily 12/06/20   Brita Romp, NP  spironolactone (ALDACTONE) 25 MG tablet Take 1 tablet (25 mg total) by mouth daily. Patient not taking: Reported on 09/08/2022 09/07/22 09/07/23  Finis Bud, NP     Allergies:     Allergies  Allergen Reactions   Benadryl [Diphenhydramine Hcl] Hypertension   Citalopram Other (See Comments)    unknown   Metformin And Related Diarrhea    Lost appetite and weight    Tramadol Other (See Comments)    Felt light headed and dizzy   Crestor [Rosuvastatin] Other (See Comments)    "Feet swelling", makes her feel weak     Physical Exam:   Vitals  Blood pressure (!) 170/63, pulse 96, temperature 98.5 F (36.9  C), temperature source Oral, resp. rate 18, height 5' (1.524 m), weight 60.3 kg, SpO2 99 %.  Physical Examination: General appearance - alert, dyspnea on exertion persist Mental status - alert, oriented to person, place, and time,  Eyes - sclera anicteric Neck - supple,  +ve JVD elevation , Chest -, diminished breath sounds, no rales actually, just a few scattered wheezes Heart - S1 and S2 normal, regular , 2/6 SM Abdomen - soft, nontender, nondistended, +BS Neurological - screening mental status exam normal, neck supple without rigidity, cranial  nerves II through XII intact, DTR's normal and symmetric Extremities - +ve pitting pedal edema noted, intact peripheral pulses  Skin - warm, dry   Data Review:    CBC Recent Labs  Lab 09/12/22 2209  WBC 6.1  HGB 10.4*  HCT 31.9*  PLT 205  MCV 93.3  MCH 30.4  MCHC 32.6  RDW 12.9   ------------------------------------------------------------------------------------------------------------------  Chemistries  Recent Labs  Lab 09/12/22 1047 09/12/22 2209  NA 136 130*  K 4.0 3.8  CL 94* 92*  CO2 29 29  GLUCOSE 210* 229*  BUN 26 30*  CREATININE 1.82* 1.92*  CALCIUM 9.4 8.9   ------------------------------------------------------------------------------------------------------------------ estimated creatinine clearance is 17.1 mL/min (A) (by C-G formula based on SCr of 1.92 mg/dL (H)). ------------------------------------------------------------------------------------------------------------------ ------------------------------------------------------------------------------------------------------------------    Component Value Date/Time   BNP 710.0 (H) 09/13/2022 0740   Urinalysis    Component Value Date/Time   COLORURINE STRAW (A) 09/13/2022 0630   APPEARANCEUR CLEAR 09/13/2022 0630   LABSPEC 1.006 09/13/2022 0630   PHURINE 6.0 09/13/2022 0630   GLUCOSEU >=500 (A) 09/13/2022 0630   HGBUR NEGATIVE 09/13/2022 0630   HGBUR trace-intact 04/28/2010 1259   BILIRUBINUR NEGATIVE 09/13/2022 0630   BILIRUBINUR neg 09/29/2013 1341   KETONESUR 20 (A) 09/13/2022 0630   PROTEINUR 100 (A) 09/13/2022 0630   UROBILINOGEN 0.2 09/29/2013 1341   UROBILINOGEN 0.2 12/28/2010 1743   NITRITE NEGATIVE 09/13/2022 0630   LEUKOCYTESUR TRACE (A) 09/13/2022 0630    ----------------------------------------------------------------------------------------------------------------   Imaging Results:    ECHOCARDIOGRAM COMPLETE  Result Date: 09/13/2022    ECHOCARDIOGRAM REPORT   Patient  Name:   Emily Jennings Date of Exam: 09/13/2022 Medical Rec #:  [id number]     Height:       62.0 in Accession #:    [id number]    Weight:       132.3 lb Date of Birth:  04/14/1934      BSA:          1.604 m Patient Age:    48 years      BP:           148/71 mmHg Patient Gender: F             HR:           93 bpm. Exam Location:  [facility] Procedure: 2D Echo, Cardiac Doppler and Color Doppler Indications:    Dyspnea  History:        Patient has prior history of Echocardiogram examinations, most                 recent 08/01/2022. CHF, CAD, Signs/Symptoms:Syncope, Murmur and                 Edema; Risk Factors:Hypertension, Diabetes and Dyslipidemia.                 Breast CA (resolved).  Sonographer:    Wenda Low Referring Phys: [id number] [place]  1. Left ventricular ejection fraction, by estimation, is 60 to 65%. The left ventricle has normal function. The left ventricle has no regional wall motion abnormalities. There is mild left ventricular hypertrophy. Left ventricular diastolic parameters are consistent with Grade II diastolic dysfunction (pseudonormalization). Elevated left ventricular end-diastolic pressure.  2. Right ventricular systolic function is normal. The right ventricular size is normal. There is moderately elevated pulmonary artery systolic pressure.  3. Right atrial size was mildly dilated.  4. 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 The mitral valve is abnormal. Trivial mitral valve regurgitation. No evidence of mitral stenosis.  5. Higher gradients likely from high CO DVI 0.69 and AVA aas high as 1.95 by VTI suggests not truly moderate AS Acceleration time also fairly normal . The aortic valve is tricuspid. There is moderate calcification of the aortic valve. There is moderate thickening of the aortic valve. Aortic valve regurgitation is not visualized. Mild  aortic valve stenosis.  6. The inferior vena cava is dilated in size with >50% respiratory variability, suggesting right atrial pressure of 8 mmHg. FINDINGS  Left Ventricle: Left ventricular ejection fraction, by estimation, is 60 to 65%. The left ventricle has normal function. The left ventricle has no regional wall motion abnormalities. The left ventricular internal cavity size was normal in size. There is  mild left ventricular hypertrophy. Left ventricular diastolic parameters are consistent with Grade II diastolic dysfunction (pseudonormalization). Elevated left ventricular end-diastolic pressure. Right Ventricle: The right ventricular size is normal. No increase in right ventricular wall thickness. Right ventricular systolic function is normal. There is moderately elevated pulmonary artery systolic pressure. The tricuspid regurgitant velocity is 3.52 m/s, and with an assumed right atrial pressure of 8 mmHg, the estimated right ventricular systolic pressure is 49.6 mmHg. Left Atrium: Left atrial size was normal in size. Right Atrium: Right atrial size was mildly dilated. Pericardium: Trivial pericardial effusion is present. The pericardial effusion is posterior to the left ventricle. 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 The mitral valve is abnormal. There is moderate thickening of the mitral valve leaflet(s). Trivial mitral valve regurgitation. No evidence of mitral valve stenosis. MV peak gradient, 12.8 mmHg. The mean mitral valve gradient is 6.0 mmHg. Tricuspid Valve: The tricuspid valve is normal in structure. Tricuspid valve regurgitation is trivial. No evidence of tricuspid stenosis. Aortic Valve: Higher gradients likely from high CO DVI 0.69 and AVA aas high as 1.95 by VTI suggests not truly moderate AS Acceleration time also fairly normal. The aortic valve is tricuspid. There  is moderate calcification of the aortic valve. There is moderate thickening of the aortic valve. Aortic valve regurgitation is not visualized. Mild aortic stenosis is present. Aortic valve mean gradient measures 20.7 mmHg. Aortic valve peak gradient measures 41.3 mmHg. Aortic valve area, by VTI measures 1.95 cm. Pulmonic Valve: The pulmonic valve was normal in structure. Pulmonic valve regurgitation is mild. No evidence of pulmonic stenosis. Aorta: The aortic root is normal in size and structure. Venous: The inferior vena cava is dilated in size with greater than 50% respiratory variability, suggesting right atrial pressure of 8 mmHg. IAS/Shunts: No atrial level shunt detected by color flow Doppler.  LEFT VENTRICLE PLAX 2D LVIDd:         4.30 cm   Diastology LVIDs:  1.70 cm   LV e' medial:    6.85 cm/s LV PW:         1.20 cm   LV E/e' medial:  23.5 LV IVS:        1.20 cm   LV e' lateral:   11.30 cm/s LVOT diam:     1.90 cm   LV E/e' lateral: 14.2 LV SV:         126 LV SV Index:   78 LVOT Area:     2.84 cm  RIGHT VENTRICLE RV Basal diam:  2.90 cm RV Mid diam:    2.00 cm RV S prime:     18.10 cm/s TAPSE (M-mode): 2.7 cm LEFT ATRIUM             Index        RIGHT ATRIUM           Index LA diam:        3.70 cm 2.31 cm/m   RA Area:     17.90 cm LA Vol (A2C):   64.5 ml 40.22 ml/m  RA Volume:   42.30 ml  26.38 ml/m LA Vol (A4C):   60.3 ml 37.60 ml/m LA Biplane Vol: 63.3 ml 39.47 ml/m  AORTIC VALVE                     PULMONIC VALVE AV Area (Vmax):    1.62 cm      PV Vmax:       1.15 m/s AV Area (Vmean):   1.90 cm      PV Peak grad:  5.3 mmHg AV Area (VTI):     1.95 cm AV Vmax:           321.33 cm/s AV Vmean:          208.667 cm/s AV VTI:            0.643 m AV Peak Grad:      41.3 mmHg AV Mean Grad:      20.7 mmHg LVOT Vmax:         184.00 cm/s LVOT Vmean:        140.000 cm/s LVOT VTI:          0.443 m LVOT/AV VTI ratio: 0.69  AORTA Ao Root diam: 3.00 cm MITRAL VALVE                TRICUSPID VALVE MV Area  (PHT): 3.99 cm     TR Peak grad:   49.6 mmHg MV Area VTI:   3.68 cm     TR Vmax:        352.00 cm/s MV Peak grad:  12.8 mmHg MV Mean grad:  6.0 mmHg     SHUNTS MV Vmax:       1.79 m/s     Systemic VTI:  0.44 m MV Vmean:      112.0 cm/s   Systemic Diam: 1.90 cm MV Decel Time: 190 msec MV E velocity: 161.00 cm/s MV A velocity: 135.00 cm/s MV E/A ratio:  1.19 Jenkins Rouge MD Electronically signed by Jenkins Rouge MD Signature Date/Time: 09/13/2022/4:20:14 PM    Final    DG Chest Portable 1 View  Result Date: 09/13/2022 CLINICAL DATA:  88 year old female with shortness of breath. EXAM: PORTABLE CHEST 1 VIEW COMPARISON:  Portable chest 09/06/2022 and earlier. FINDINGS: Portable AP upright view at 0728 hours. Stable lung volumes and cardiomegaly. Calcified aortic atherosclerosis. Other mediastinal contours are within normal limits. Visualized  tracheal air column is within normal limits. Chronic left lung base hypo ventilation is stable. Elsewhere when allowing for portable technique the lungs are clear. Hair artifact suspected at the upper chest greater on the left. No acute osseous abnormality identified. Paucity of bowel gas in the visible abdomen. IMPRESSION: No acute cardiopulmonary abnormality. Mild cardiomegaly. Aortic Atherosclerosis (ICD10-I70.0). Electronically Signed   By: Genevie Ann M.D.   On: 09/13/2022 07:48    Radiological Exams on Admission: ECHOCARDIOGRAM COMPLETE  Result Date: 09/13/2022    ECHOCARDIOGRAM REPORT   Patient Name:   Emily Jennings Date of Exam: 09/13/2022 Medical Rec #:  [id number]     Height:       62.0 in Accession #:    [id number]    Weight:       132.3 lb Date of Birth:  01-Apr-1934      BSA:          1.604 m Patient Age:    89 years      BP:           148/71 mmHg Patient Gender: F             HR:           93 bpm. Exam Location:  [facility] Procedure: 2D Echo, Cardiac Doppler and Color Doppler Indications:    Dyspnea  History:        Patient has prior history of Echocardiogram  examinations, most                 recent 08/01/2022. CHF, CAD, Signs/Symptoms:Syncope, Murmur and                 Edema; Risk Factors:Hypertension, Diabetes and Dyslipidemia.                 Breast CA (resolved).  Sonographer:    Wenda Low Referring Phys: [id number] [place]  1. Left ventricular ejection fraction, by estimation, is 60 to 65%. The left ventricle has normal function. The left ventricle has no regional wall motion abnormalities. There is mild left ventricular hypertrophy. Left ventricular diastolic parameters are consistent with Grade II diastolic dysfunction (pseudonormalization). Elevated left ventricular end-diastolic pressure.  2. Right ventricular systolic function is normal. The right ventricular size is normal. There is moderately elevated pulmonary artery systolic pressure.  3. Right atrial size was mildly dilated.  4. 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 The mitral valve is abnormal. Trivial mitral valve regurgitation. No evidence of mitral stenosis.  5. Higher gradients likely from high CO DVI 0.69 and AVA aas high as 1.95 by VTI suggests not truly moderate AS Acceleration time also fairly normal . The aortic valve is tricuspid. There is moderate calcification of the aortic valve. There is moderate thickening of the aortic valve. Aortic valve regurgitation is not visualized. Mild aortic valve stenosis.  6. The inferior vena cava is dilated in size with >50% respiratory variability, suggesting right atrial pressure of 8 mmHg. FINDINGS  Left Ventricle: Left ventricular ejection fraction, by estimation, is 60 to 65%. The left ventricle has normal function. The left ventricle has no regional wall motion abnormalities. The left ventricular internal cavity size was normal in size. There is  mild left ventricular hypertrophy. Left ventricular diastolic  parameters are consistent with Grade II diastolic dysfunction (pseudonormalization). Elevated left ventricular end-diastolic pressure. Right Ventricle: The right ventricular  size is normal. No increase in right ventricular wall thickness. Right ventricular systolic function is normal. There is moderately elevated pulmonary artery systolic pressure. The tricuspid regurgitant velocity is 3.52 m/s, and with an assumed right atrial pressure of 8 mmHg, the estimated right ventricular systolic pressure is 17.4 mmHg. Left Atrium: Left atrial size was normal in size. Right Atrium: Right atrial size was mildly dilated. Pericardium: Trivial pericardial effusion is present. The pericardial effusion is posterior to the left ventricle. 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 The mitral valve is abnormal. There is moderate thickening of the mitral valve leaflet(s). Trivial mitral valve regurgitation. No evidence of mitral valve stenosis. MV peak gradient, 12.8 mmHg. The mean mitral valve gradient is 6.0 mmHg. Tricuspid Valve: The tricuspid valve is normal in structure. Tricuspid valve regurgitation is trivial. No evidence of tricuspid stenosis. Aortic Valve: Higher gradients likely from high CO DVI 0.69 and AVA aas high as 1.95 by VTI suggests not truly moderate AS Acceleration time also fairly normal. The aortic valve is tricuspid. There is moderate calcification of the aortic valve. There is moderate thickening of the aortic valve. Aortic valve regurgitation is not visualized. Mild aortic stenosis is present. Aortic valve mean gradient measures 20.7 mmHg. Aortic valve peak gradient measures 41.3 mmHg. Aortic valve area, by VTI measures 1.95 cm. Pulmonic Valve: The pulmonic valve was normal in structure. Pulmonic valve regurgitation is mild. No evidence of pulmonic stenosis. Aorta: The aortic root is  normal in size and structure. Venous: The inferior vena cava is dilated in size with greater than 50% respiratory variability, suggesting right atrial pressure of 8 mmHg. IAS/Shunts: No atrial level shunt detected by color flow Doppler.  LEFT VENTRICLE PLAX 2D LVIDd:         4.30 cm   Diastology LVIDs:         1.70 cm   LV e' medial:    6.85 cm/s LV PW:         1.20 cm   LV E/e' medial:  23.5 LV IVS:        1.20 cm   LV e' lateral:   11.30 cm/s LVOT diam:     1.90 cm   LV E/e' lateral: 14.2 LV SV:         126 LV SV Index:   78 LVOT Area:     2.84 cm  RIGHT VENTRICLE RV Basal diam:  2.90 cm RV Mid diam:    2.00 cm RV S prime:     18.10 cm/s TAPSE (M-mode): 2.7 cm LEFT ATRIUM             Index        RIGHT ATRIUM           Index LA diam:        3.70 cm 2.31 cm/m   RA Area:     17.90 cm LA Vol (A2C):   64.5 ml 40.22 ml/m  RA Volume:   42.30 ml  26.38 ml/m LA Vol (A4C):   60.3 ml 37.60 ml/m LA Biplane Vol: 63.3 ml 39.47 ml/m  AORTIC VALVE                     PULMONIC VALVE AV Area (Vmax):    1.62 cm      PV Vmax:       1.15 m/s AV Area (  Vmean):   1.90 cm      PV Peak grad:  5.3 mmHg AV Area (VTI):     1.95 cm AV Vmax:           321.33 cm/s AV Vmean:          208.667 cm/s AV VTI:            0.643 m AV Peak Grad:      41.3 mmHg AV Mean Grad:      20.7 mmHg LVOT Vmax:         184.00 cm/s LVOT Vmean:        140.000 cm/s LVOT VTI:          0.443 m LVOT/AV VTI ratio: 0.69  AORTA Ao Root diam: 3.00 cm MITRAL VALVE                TRICUSPID VALVE MV Area (PHT): 3.99 cm     TR Peak grad:   49.6 mmHg MV Area VTI:   3.68 cm     TR Vmax:        352.00 cm/s MV Peak grad:  12.8 mmHg MV Mean grad:  6.0 mmHg     SHUNTS MV Vmax:       1.79 m/s     Systemic VTI:  0.44 m MV Vmean:      112.0 cm/s   Systemic Diam: 1.90 cm MV Decel Time: 190 msec MV E velocity: 161.00 cm/s MV A velocity: 135.00 cm/s MV E/A ratio:  1.19 Jenkins Rouge MD Electronically signed by Jenkins Rouge MD Signature Date/Time: 09/13/2022/4:20:14 PM    Final     DG Chest Portable 1 View  Result Date: 09/13/2022 CLINICAL DATA:  88 year old female with shortness of breath. EXAM: PORTABLE CHEST 1 VIEW COMPARISON:  Portable chest 09/06/2022 and earlier. FINDINGS: Portable AP upright view at 0728 hours. Stable lung volumes and cardiomegaly. Calcified aortic atherosclerosis. Other mediastinal contours are within normal limits. Visualized tracheal air column is within normal limits. Chronic left lung base hypo ventilation is stable. Elsewhere when allowing for portable technique the lungs are clear. Hair artifact suspected at the upper chest greater on the left. No acute osseous abnormality identified. Paucity of bowel gas in the visible abdomen. IMPRESSION: No acute cardiopulmonary abnormality. Mild cardiomegaly. Aortic Atherosclerosis (ICD10-I70.0). Electronically Signed   By: Genevie Ann M.D.   On: 09/13/2022 07:48    DVT Prophylaxis -SCD /Heparin AM Labs Ordered, also please review Full Orders  Family Communication: Admission, patients condition and plan of care including tests being ordered have been discussed with the patient who indicate understanding and agree with the plan   Condition -stablle  Roxan Hockey M.D on 09/13/2022 at 5:34 PM Go to [email or web address] -  for contact info  [facility] - Office  [phone]

## 2022-09-13 NOTE — ED Notes (Signed)
Pt states she normally takes her BP medication in the mornings and asks if she can get something for high BP--MD made aware

## 2022-09-13 NOTE — Consult Note (Addendum)
Cardiology Consultation   Patient ID: WINNI EHRHARD MRN: [id number]; DOB: Feb 24, 1934  Admit date: 09/13/2022 Date of Consult: 09/13/2022  PCP:  [name], [facility] Cardiologist:  Carlyle Dolly, MD   {  Patient Profile:   Emily Jennings is a 88 y.o. female with a hx of HFpEF, type 2 diabetes, GERD, stage 3 CKD, hyperlipidemia, mild AS who is being seen 09/13/2022 for the evaluation of CHF at the request of Dr. Oswald Hillock.  History of Present Illness:   Emily Jennings is followed by Dr. Harl Bowie for the above cardiac issues.  Echo was ordered for heart murmur in November 2023.  Showed normal EF, no wall motion abnormalities, moderate LVH, moderately dilated left atrium, probable ruptured chordae attached to anterior leaflet, trivial MR, mild AS.   She was admitted September 05, 2022 for shortness of breath.  She was seen by her PCP lower leg edema and crackles on exam, so she was sent to the ER.  Patient was started on IV Lasix.  He was discharged on Lasix 40 mg daily.  She was seen in follow-up September 07, 2022 reported she not been taking her Lasix.  She was instructed to increase Lasix to 40 mg twice daily for 3 days and return to Lasix 40 mg daily.  She was instructed to take this along with potassium, and start spironolactone.  Patient presented to the [facility] 09/13/2022 for shortness of breath and lower leg edema. The patient reports she stopped taking her lasix 4 days ago. She felt she was not urinating enough and lasix was helping. She denies chest pain, has some discomfort with breathing.   In the ER blood pressure 165/65, pulse 73, afebrile, respiratory rate 22, 92 percent O2.  Labs showed creatinine 1.82, BUN 26, WBC 6.1, hemoglobin 10.4.  BNP 710.  High-sensitivity troponin normal.  Respiratory panel negative.  Chest x-ray nonacute. Patient was admitted for further work-up.   Past Medical History:  Diagnosis Date   Allergy    Anxiety     ANXIETY DISORDER, GENERALIZED 07/15/2007   Qualifier: Diagnosis of  By: Rennie Plowman     Arthritis    Cancer [facility]) 2009   breast, carcinoma in situ left   Carcinoma in situ of breast 05/21/2008   Qualifier: Diagnosis of  By: Moshe Cipro MD, Margaret  Diagnosed in 2009, completed 5 year course of tamoxifen, no evidence of recurrence    Carotid stenosis    11/16/2005  mild plaque formation and stenosis proximal right ECA   Cataract    Complication of anesthesia    Coronary artery disease    cardiac catheterization on 03/20/2006  LAD mid 40% stenosis, left circumflex mild 40% stenosis, RCA mid-vessel 40% to 50% lesion   EF 60%   Diabetes mellitus    GERD (gastroesophageal reflux disease)    Hernia, inguinal    left   Hyperglycemia    Hypertension    Insomnia 11/16/2011   Low blood potassium    Non-insulin dependent type 2 diabetes mellitus ([place])    Osteoporosis    Shortness of breath    2D Echocardiogram 01/26/2009   EF of greater than 55%, mild MR, mild TR, normal ventricular function   Thickened endometrium 10/26/2017   Noted by gyne in 2017, missed 6 month follow up, referred in 09/2017   Ventricular tachycardia, non-sustained ([place])    developed during stress test 02/08/2006, spontaneously aborted, mild reversible apical defect  Past Surgical History:  Procedure Laterality Date   BREAST LUMPECTOMY Left 2009   Left breast 2009   CATARACT EXTRACTION W/PHACO Left 10/28/2014   Procedure: PHACO EMULSION CATARACT EXTRACTION WITH INTRAOCULAR LENS IMPLANT LEFT EYE ([place]);  Surgeon: Marylynn Pearson, MD;  Location: [facility];  Service: Ophthalmology;  Laterality: Left;   COLONOSCOPY     cyst removed from left foot     REFRACTIVE SURGERY Left      Home Medications:  Prior to Admission medications   Medication Sig Start Date End Date Taking? Authorizing Provider  acetaminophen (TYLENOL) 500 MG tablet Take one tablet two times daily for 3 days, then as needed 08/27/18  Yes [name],  MD  alendronate (FOSAMAX) 70 MG tablet TAKE 1 TABLET BY MOUTH EVERY 7 DAYS. TAKE WITH A FULL GLASS OF WATER ON AN EMPTY STOMACH Patient taking differently: Take 70 mg by mouth once a week. TAKE 1 TABLET BY MOUTH EVERY 7 DAYS. TAKE WITH A FULL GLASS OF WATER ON AN EMPTY STOMACH 01/04/22  Yes [name], MD  amLODipine-olmesartan (AZOR) 10-40 MG tablet TAKE 1 TABLET BY MOUTH EVERY DAY 07/20/22  Yes [name], MD  aspirin EC 81 MG tablet Take 81 mg by mouth daily.   Yes [provider]  carvedilol (COREG) 12.5 MG tablet Take 1.5 tablets (18.75 mg total) by mouth in the morning and at bedtime. 07/20/22  Yes BranchAlphonse Guild, MD  cholecalciferol (VITAMIN D3) 25 MCG (1000 UT) tablet Take 2,000 Units by mouth daily.   Yes [provider]  furosemide (LASIX) 40 MG tablet Take 1 tablet (40 mg total) by mouth 2 (two) times daily. For 3 days, then reduce to 40 mg daily 09/07/22  Yes Finis Bud, NP  insulin lispro protamine-lispro (HUMALOG MIX 75/25) (75-25) 100 UNIT/ML SUSP injection Inject 10-15 Units into the skin See admin instructions. 10 units bid if BG above 90 15 units  if it is over 300 09/06/22  Yes Johnson, Clanford L, MD  potassium chloride SA (KLOR-CON M) 20 MEQ tablet Take 1 tablet (20 mEq total) by mouth daily. 09/06/22  Yes Johnson, Clanford L, MD  rosuvastatin (CRESTOR) 5 MG tablet TAKE 1 TABLET (5 MG TOTAL) BY MOUTH DAILY. Patient taking differently: Take 5 mg by mouth daily. 01/19/22  Yes [name], MD  blood glucose meter kit and supplies KIT One touch Ultra. Use three times daily as directed. (FOR ICD E11.65) 10/27/20   Cassandria Anger, MD  glucose blood (ONETOUCH ULTRA) test strip USE TO TEST 2 TIMES A DAY 07/17/22   Cassandria Anger, MD  Insulin Pen Needle 32G X 4 MM MISC 1 each by Does not apply route as directed. 01/22/20   Cassandria Anger, MD  Insulin Syringe-Needle U-100 (BD INSULIN SYRINGE U/F) 31G X 5/16" 0.5 ML MISC USE AS  DIRECTED TWICE DAILY 01/26/20   [name], [name], MD  mupirocin ointment (BACTROBAN) 2 % Apply topically 2 (two) times daily. Patient not taking: Reported on 09/08/2022 11/23/21   [name], MD  OneTouch Delica Lancets 26Z MISC 1 each by Does not apply route 3 (three) times daily. Use to check glucose three times daily 12/06/20   Brita Romp, NP  spironolactone (ALDACTONE) 25 MG tablet Take 1 tablet (25 mg total) by mouth daily. Patient not taking: Reported on 09/08/2022 09/07/22 09/07/23  Finis Bud, NP    Inpatient Medications: Scheduled Meds:  furosemide  40 mg Intravenous Once  Continuous Infusions:  PRN Meds:   Allergies:    Allergies  Allergen Reactions   Benadryl [Diphenhydramine Hcl] Hypertension   Citalopram Other (See Comments)    unknown   Metformin And Related Diarrhea    Lost appetite and weight    Tramadol Other (See Comments)    Felt light headed and dizzy   Crestor [Rosuvastatin] Other (See Comments)    "Feet swelling", makes her feel weak    Social History:   Social History   Socioeconomic History   Marital status: Married    Spouse name: Not on file   Number of children: 2   Years of education: na   Highest education level: Some college, no degree  Occupational History   Occupation: retired    Fish farm manager: retired  Tobacco Use   Smoking status: Never    Passive exposure: Never   Smokeless tobacco: Never  Vaping Use   Vaping Use: Never used  Substance and Sexual Activity   Alcohol use: No   Drug use: No   Sexual activity: Not Currently    Birth control/protection: Post-menopausal  Other Topics Concern   Not on file  Social History Narrative   Lives with her husband and son and attends yoga at the [facility] 2 days a week and speaks to her grandson nightly on the phone   Social Determinants of Health   Financial Resource Strain: Low Risk  (11/14/2021)   Overall Financial Resource Strain (CARDIA)    Difficulty of Paying  Living Expenses: Not hard at all  Food Insecurity: No Food Insecurity (09/08/2022)   Hunger Vital Sign    Worried About Running Out of Food in the Last Year: Never true    Ran Out of Food in the Last Year: Never true  Transportation Needs: No Transportation Needs (09/08/2022)   PRAPARE - Hydrologist (Medical): No    Lack of Transportation (Non-Medical): No  Physical Activity: Insufficiently Active (11/14/2021)   Exercise Vital Sign    Days of Exercise per Week: 7 days    Minutes of Exercise per Session: 20 min  Stress: No Stress Concern Present (11/14/2021)   [place]    Feeling of Stress : Only a little  Social Connections: Moderately Integrated (11/14/2021)   Social Connection and Isolation Panel [NHANES]    Frequency of Communication with Friends and Family: More than three times a week    Frequency of Social Gatherings with Friends and Family: Once a week    Attends Religious Services: More than 4 times per year    Active Member of [employer] or Organizations: No    Attends Archivist Meetings: Never    Marital Status: Married  Human resources officer Violence: Not At Risk (09/08/2022)   Humiliation, Afraid, Rape, and Kick questionnaire    Fear of Current or Ex-Partner: No    Emotionally Abused: No    Physically Abused: No    Sexually Abused: No    Family History:    Family History  Problem Relation Age of Onset   Hypertension Mother    Hyperlipidemia Mother    Stroke Mother    Urticaria Mother    Cancer Father        pancreatic   Colon cancer Father    Heart disease Brother 1       bypass   Heart disease Brother 68       bypass   Arthritis Other  Asthma Other    Diabetes Other    Colon cancer Paternal Aunt    Esophageal cancer Neg Hx    Stomach cancer Neg Hx    Rectal cancer Neg Hx      ROS:  Please see the history of present illness.   All other ROS reviewed  and negative.     Physical Exam/Data:   Vitals:   09/13/22 0715 09/13/22 0737 09/13/22 0800 09/13/22 0830  BP: (!) 175/69  (!) 163/65 (!) 156/61  Pulse: 88  89 86  Resp: 20  20   Temp:  98.1 F (36.7 C)  98.1 F (36.7 C)  TempSrc:  Oral  Oral  SpO2: 93%  95% 98%  Weight:      Height:       No intake or output data in the 24 hours ending 09/13/22 0945    09/12/2022    9:56 PM 09/07/2022    2:29 PM 09/05/2022    8:33 PM  Last 3 Weights  Weight (lbs) 132 lb 4.4 oz 132 lb 9.6 oz 133 lb 6.1 oz  Weight (kg) 60 kg 60.147 kg 60.5 kg     Body mass index is 24.19 kg/m.  General:  Well nourished, well developed, in no acute distress HEENT: normal Neck: no JVD Vascular: No carotid bruits; Distal pulses 2+ bilaterally Cardiac:  normal S1, S2; RRR; + murmur  Lungs:  diffusely diminished  Abd: soft, nontender, no hepatomegaly  Ext: no edema Musculoskeletal:  No deformities, BUE and BLE strength normal and equal Skin: warm and dry  Neuro:  CNs 2-12 intact, no focal abnormalities noted Psych:  Normal affect   EKG:  The EKG was personally reviewed and demonstrates:  pending Telemetry:  Telemetry was personally reviewed and demonstrates:  unable to review  Relevant CV Studies:  Echo 07/2022  1. Left ventricular ejection fraction, by estimation, is 60 to 65%. The  left ventricle has normal function. The left ventricle has no regional  wall motion abnormalities. There is moderate left ventricular hypertrophy.  Left ventricular diastolic  parameters are indeterminate. Elevated left atrial pressure.   2. Right ventricular systolic function is normal. The right ventricular  size is normal. There is normal pulmonary artery systolic pressure.   3. Left atrial size was moderately dilated.   4. Probable ruptured chordae attached to anterior MV leaflet. . The  mitral valve is normal in structure. Trivial mitral valve regurgitation.  No evidence of mitral stenosis.   5. The tricuspid  valve is abnormal.   6. The aortic valve is tricuspid. There is mild calcification of the  aortic valve. There is mild thickening of the aortic valve. Aortic valve  regurgitation is not visualized. Mild aortic valve stenosis. Aortic valve  mean gradient measures 14.7 mmHg.  Aortic valve peak gradient measures 28.9 mmHg. Aortic valve area, by VTI  measures 1.64 cm.   7. The inferior vena cava is normal in size with greater than 50%  respiratory variability, suggesting right atrial pressure of 3 mmHg.   Laboratory Data:  High Sensitivity Troponin:   Recent Labs  Lab 09/05/22 1500 09/05/22 1727 09/13/22 0740  TROPONINIHS _0 Chemistry Recent Labs  Lab 09/12/22 1047 09/12/22 2209  NA 136 130*  K 4.0 3.8  CL 94* 92*  CO2 29 29  GLUCOSE 210* 229*  BUN 26 30*  CREATININE 1.82* 1.92*  CALCIUM 9.4 8.9  GFRNONAA  --  25*  ANIONGAP  --  9    No results for input(s): "PROT", "ALBUMIN", "AST", "ALT", "ALKPHOS", "BILITOT" in the last 168 hours. Lipids No results for input(s): "CHOL", "TRIG", "HDL", "LABVLDL", "LDLCALC", "CHOLHDL" in the last 168 hours.  Hematology Recent Labs  Lab 09/12/22 2209  WBC 6.1  RBC 3.42*  HGB 10.4*  HCT 31.9*  MCV 93.3  MCH 30.4  MCHC 32.6  RDW 12.9  PLT 205   Thyroid No results for input(s): "TSH", "FREET4" in the last 168 hours.  BNP Recent Labs  Lab 09/13/22 0740  BNP 710.0*    DDimer No results for input(s): "DDIMER" in the last 168 hours.   Radiology/Studies:  DG Chest Portable 1 View  Result Date: 09/13/2022 CLINICAL DATA:  88 year old female with shortness of breath. EXAM: PORTABLE CHEST 1 VIEW COMPARISON:  Portable chest 09/06/2022 and earlier. FINDINGS: Portable AP upright view at 0728 hours. Stable lung volumes and cardiomegaly. Calcified aortic atherosclerosis. Other mediastinal contours are within normal limits. Visualized tracheal air column is within normal limits. Chronic left lung base hypo ventilation is stable.  Elsewhere when allowing for portable technique the lungs are clear. Hair artifact suspected at the upper chest greater on the left. No acute osseous abnormality identified. Paucity of bowel gas in the visible abdomen. IMPRESSION: No acute cardiopulmonary abnormality. Mild cardiomegaly. Aortic Atherosclerosis (ICD10-I70.0). Electronically Signed   By: Genevie Ann M.D.   On: 09/13/2022 07:48     Assessment and Plan:   Acute on chronic diastolic heart failure SOB -Patient presented with shortness of breath and lower leg edema for the last few days.  She stopped her Lasix 4 days ago.  Patient was also supposed to be on spironolactone as well -Second admission this month for acute heart failure -On admission BNP in the 700s.  Chest x-ray non-acute, she was hypoxic with O2 down to 89% -Patient denies chest pain -Echo from November 2023 showed LVEF 60 to 65%, moderate LVH, probable ruptured chordae attached to the anterior MV leaflet, mitral valve is normal, trivial MR, mild AS -Repeat echo with focus on mitral valve -Plan to diuresis with IV Lasix 40 mg twice daily -PTA Lasix 40 mg daily -continue PTA Coreg 12.5 mg twice daily, amlodipine/olmesartan 10-36m daily, and spironolactone 25 mg daily -Monitor strict I's and O's, kidney function, daily weights with diuresis  Abnormal echo -Recent echo showed normal EF with probable ruptured chordae attached to anterior leaflet -Repeat echo as above - possible contributing to SOB -Not likely surgical candidate, continue medical management  HTN - BP elevated - restart home medications  CKD stage 3 - baseline around 1.5 - Scr 1.82 on admission - monitor with diuresis  For questions or updates, please contact [place]spectPlease consult [email or web address] for contact info under    Signed, Cadence HArlyss Repress 09/13/2022 9:45 AM   Patient examined chart reviewed discussed care with PA, patient and family Exam with frail elderly black female.  Loud mixed murmur AS/MR. Lungs clear JVP elevated abdomen benign plus one edema. BNP mildly elevated CXR no frank edema. Agree with admission for diuresis Etiology of low sats and dyspnea likely mixed. Reviewed recent echo and has mild/mod AS She has thickened MV with mass attached to anterior chord causing SAM but no LVOT turbulence and mild appearing MR May be functional and worse with exertion Also concern for amyloid with some LVH/speckled myocardium Given age not sure it would be worth Tc scan Will see how she responds to diuresis . Also check extended  respiratory panel   Jenkins Rouge MD [facility]

## 2022-09-13 NOTE — ED Provider Notes (Signed)
[facility] EMERGENCY DEPARTMENT Provider Note   CSN: [id number] Arrival date & time: 09/12/22  2141     History Chief Complaint  Patient presents with   Weakness    HPI Emily Jennings is a 88 y.o. female presenting for chief complaint of shortness of breath.  She has a history of heart failure recently admitted to the hospital.  Seen at cardiology clinic last week after hospital discharge.  There is concern for continued fluid overload.  She was recommended to come back to the emergency department at that time but patient declined.  She was offered outpatient diuresis with twice daily Lasix this weekend.  She has continued to take her twice daily Lasix and has become more short of breath.  She states that she has not urinated in 48 hours.  She endorses dyspnea on exertion.   Patient's recorded medical, surgical, social, medication list and allergies were reviewed in the Snapshot window as part of the initial history.   Review of Systems   Review of Systems  Constitutional:  Positive for fatigue. Negative for chills and fever.  HENT:  Negative for ear pain and sore throat.   Eyes:  Negative for pain and visual disturbance.  Respiratory:  Positive for shortness of breath. Negative for cough.   Cardiovascular:  Negative for chest pain and palpitations.  Gastrointestinal:  Negative for abdominal pain and vomiting.  Genitourinary:  Negative for dysuria and hematuria.  Musculoskeletal:  Negative for arthralgias and back pain.  Skin:  Negative for color change and rash.  Neurological:  Negative for seizures and syncope.  All other systems reviewed and are negative.   Physical Exam Updated Vital Signs BP (!) 156/61   Pulse 86   Temp 98.1 F (36.7 C) (Oral)   Resp 20   Ht '5\' 2"'$  (1.575 m)   Wt 60 kg   SpO2 98%   BMI 24.19 kg/m  Physical Exam Vitals and nursing note reviewed.  Constitutional:      General: She is not in acute distress.    Appearance: She is well-developed.   HENT:     Head: Normocephalic and atraumatic.  Eyes:     Conjunctiva/sclera: Conjunctivae normal.  Cardiovascular:     Rate and Rhythm: Normal rate and regular rhythm.     Heart sounds: No murmur heard. Pulmonary:     Effort: Pulmonary effort is normal. No respiratory distress.     Breath sounds: Rales present.  Abdominal:     Palpations: Abdomen is soft.     Tenderness: There is no abdominal tenderness.  Musculoskeletal:        General: No swelling.     Cervical back: Neck supple.     Right lower leg: Edema present.     Left lower leg: Edema present.  Skin:    General: Skin is warm and dry.     Capillary Refill: Capillary refill takes less than 2 seconds.  Neurological:     Mental Status: She is alert.  Psychiatric:        Mood and Affect: Mood normal.      ED Course/ Medical Decision Making/ A&P    Procedures Procedures   Medications Ordered in ED Medications  furosemide (LASIX) injection 40 mg (has no administration in time range)  amLODipine (NORVASC) tablet 10 mg (10 mg Oral Given 09/13/22 0942)    Medical Decision Making:    XINYI BATTON is a 88 y.o. female who presented to the ED today with shortness  of breath detailed above.     Additional history discussed with patient's family/caregivers.  Patient's presentation is complicated by their history of multiple comorbid medical conditions including advanced age and underlying heart failure.  Patient placed on continuous vitals and telemetry monitoring while in ED which was reviewed periodically.   Complete initial physical exam performed, notably the patient  was hemodynamically stable in no acute distress.  She is tachypneic even at rest satting 92% on room air.  Ambulatory sats were performed and demonstrate***.      Reviewed and confirmed nursing documentation for past medical history, family history, social history.    Initial Assessment:   With the patient's presentation of shortness of breath, most  likely diagnosis is heart failure exacerbation. Other diagnoses were considered including (but not limited to) pneumonia, pneumothorax, infection including viral etiologies. These are considered less likely due to history of present illness and physical exam findings.   This is most consistent with an acute life/limb threatening illness complicated by underlying chronic conditions.  Initial Plan:  Screening labs including CBC and Metabolic panel to evaluate for infectious or metabolic etiology of disease.  Urinalysis with reflex culture ordered to evaluate for UTI or relevant urologic/nephrologic pathology.  CXR to evaluate for structural/infectious intrathoracic pathology.  BNP/troponin EKG to evaluate for cardiac pathology. Objective evaluation as below reviewed with plan for close reassessment  Initial Study Results:   Laboratory  All laboratory results reviewed without evidence of clinically relevant pathology.   ***Exceptions include: ***   EKG EKG was reviewed independently. Rate, rhythm, axis, intervals all examined and without medically relevant abnormality. ST segments without concerns for elevations.    Radiology  All images reviewed independently. Agree with radiology report at this time.   DG Chest Portable 1 View  Result Date: 09/13/2022 CLINICAL DATA:  88 year old female with shortness of breath. EXAM: PORTABLE CHEST 1 VIEW COMPARISON:  Portable chest 09/06/2022 and earlier. FINDINGS: Portable AP upright view at 0728 hours. Stable lung volumes and cardiomegaly. Calcified aortic atherosclerosis. Other mediastinal contours are within normal limits. Visualized tracheal air column is within normal limits. Chronic left lung base hypo ventilation is stable. Elsewhere when allowing for portable technique the lungs are clear. Hair artifact suspected at the upper chest greater on the left. No acute osseous abnormality identified. Paucity of bowel gas in the visible abdomen. IMPRESSION: No  acute cardiopulmonary abnormality. Mild cardiomegaly. Aortic Atherosclerosis (ICD10-I70.0). Electronically Signed   By: Genevie Ann M.D.   On: 09/13/2022 07:48     Consults:  Case discussed with Dr. Johnsie Cancel of cardiology.  He stated patient could be admitted and would plan to consult in the inpatient setting.  Final Assessment and Plan:   ***   ***  Clinical Impression: No diagnosis found.   Data Unavailable   Final Clinical Impression(s) / ED Diagnoses Final diagnoses:  None    Rx / DC Orders ED Discharge Orders     None

## 2022-09-13 NOTE — Plan of Care (Signed)
  Problem: Health Behavior/Discharge Planning: Goal: Ability to manage health-related needs will improve Outcome: Progressing   

## 2022-09-14 ENCOUNTER — Other Ambulatory Visit: Payer: Self-pay | Admitting: *Deleted

## 2022-09-14 DIAGNOSIS — N183 Chronic kidney disease, stage 3 unspecified: Secondary | ICD-10-CM | POA: Diagnosis not present

## 2022-09-14 DIAGNOSIS — Z961 Presence of intraocular lens: Secondary | ICD-10-CM | POA: Diagnosis present

## 2022-09-14 DIAGNOSIS — Z794 Long term (current) use of insulin: Secondary | ICD-10-CM | POA: Diagnosis not present

## 2022-09-14 DIAGNOSIS — Z83438 Family history of other disorder of lipoprotein metabolism and other lipidemia: Secondary | ICD-10-CM | POA: Diagnosis not present

## 2022-09-14 DIAGNOSIS — I340 Nonrheumatic mitral (valve) insufficiency: Secondary | ICD-10-CM | POA: Diagnosis not present

## 2022-09-14 DIAGNOSIS — Z86000 Personal history of in-situ neoplasm of breast: Secondary | ICD-10-CM | POA: Diagnosis not present

## 2022-09-14 DIAGNOSIS — D631 Anemia in chronic kidney disease: Secondary | ICD-10-CM | POA: Diagnosis not present

## 2022-09-14 DIAGNOSIS — Z823 Family history of stroke: Secondary | ICD-10-CM | POA: Diagnosis not present

## 2022-09-14 DIAGNOSIS — Z1152 Encounter for screening for COVID-19: Secondary | ICD-10-CM | POA: Diagnosis not present

## 2022-09-14 DIAGNOSIS — N1832 Chronic kidney disease, stage 3b: Secondary | ICD-10-CM | POA: Diagnosis not present

## 2022-09-14 DIAGNOSIS — I511 Rupture of chordae tendineae, not elsewhere classified: Secondary | ICD-10-CM | POA: Diagnosis not present

## 2022-09-14 DIAGNOSIS — I10 Essential (primary) hypertension: Secondary | ICD-10-CM | POA: Diagnosis not present

## 2022-09-14 DIAGNOSIS — E1165 Type 2 diabetes mellitus with hyperglycemia: Secondary | ICD-10-CM | POA: Diagnosis not present

## 2022-09-14 DIAGNOSIS — K219 Gastro-esophageal reflux disease without esophagitis: Secondary | ICD-10-CM | POA: Diagnosis present

## 2022-09-14 DIAGNOSIS — E785 Hyperlipidemia, unspecified: Secondary | ICD-10-CM | POA: Diagnosis not present

## 2022-09-14 DIAGNOSIS — Z825 Family history of asthma and other chronic lower respiratory diseases: Secondary | ICD-10-CM | POA: Diagnosis not present

## 2022-09-14 DIAGNOSIS — Z9842 Cataract extraction status, left eye: Secondary | ICD-10-CM | POA: Diagnosis not present

## 2022-09-14 DIAGNOSIS — Z833 Family history of diabetes mellitus: Secondary | ICD-10-CM | POA: Diagnosis not present

## 2022-09-14 DIAGNOSIS — Z800 Family history of malignant neoplasm of digestive organs: Secondary | ICD-10-CM | POA: Diagnosis not present

## 2022-09-14 DIAGNOSIS — R54 Age-related physical debility: Secondary | ICD-10-CM | POA: Diagnosis not present

## 2022-09-14 DIAGNOSIS — R0902 Hypoxemia: Secondary | ICD-10-CM | POA: Diagnosis present

## 2022-09-14 DIAGNOSIS — I130 Hypertensive heart and chronic kidney disease with heart failure and stage 1 through stage 4 chronic kidney disease, or unspecified chronic kidney disease: Secondary | ICD-10-CM | POA: Diagnosis not present

## 2022-09-14 DIAGNOSIS — E1122 Type 2 diabetes mellitus with diabetic chronic kidney disease: Secondary | ICD-10-CM | POA: Diagnosis not present

## 2022-09-14 DIAGNOSIS — I350 Nonrheumatic aortic (valve) stenosis: Secondary | ICD-10-CM | POA: Diagnosis present

## 2022-09-14 DIAGNOSIS — R0602 Shortness of breath: Secondary | ICD-10-CM | POA: Diagnosis not present

## 2022-09-14 DIAGNOSIS — Z8249 Family history of ischemic heart disease and other diseases of the circulatory system: Secondary | ICD-10-CM | POA: Diagnosis not present

## 2022-09-14 DIAGNOSIS — Z79899 Other long term (current) drug therapy: Secondary | ICD-10-CM | POA: Diagnosis not present

## 2022-09-14 DIAGNOSIS — R0600 Dyspnea, unspecified: Secondary | ICD-10-CM | POA: Diagnosis not present

## 2022-09-14 DIAGNOSIS — I5033 Acute on chronic diastolic (congestive) heart failure: Secondary | ICD-10-CM | POA: Diagnosis present

## 2022-09-14 LAB — CBC
HCT: 29.1 % — ABNORMAL LOW (ref 36.0–46.0)
Hemoglobin: 9.4 g/dL — ABNORMAL LOW (ref 12.0–15.0)
MCH: 30.1 pg (ref 26.0–34.0)
MCHC: 32.3 g/dL (ref 30.0–36.0)
MCV: 93.3 fL (ref 80.0–100.0)
Platelets: 210 10*3/uL (ref 150–400)
RBC: 3.12 MIL/uL — ABNORMAL LOW (ref 3.87–5.11)
RDW: 12.9 % (ref 11.5–15.5)
WBC: 7.6 10*3/uL (ref 4.0–10.5)
nRBC: 0 % (ref 0.0–0.2)

## 2022-09-14 LAB — RENAL FUNCTION PANEL
Albumin: 3.1 g/dL — ABNORMAL LOW (ref 3.5–5.0)
Anion gap: 9 (ref 5–15)
BUN: 37 mg/dL — ABNORMAL HIGH (ref 8–23)
CO2: 31 mmol/L (ref 22–32)
Calcium: 8.8 mg/dL — ABNORMAL LOW (ref 8.9–10.3)
Chloride: 100 mmol/L (ref 98–111)
Creatinine, Ser: 1.85 mg/dL — ABNORMAL HIGH (ref 0.44–1.00)
GFR, Estimated: 26 mL/min — ABNORMAL LOW (ref >60)
Glucose, Bld: 192 mg/dL — ABNORMAL HIGH (ref 70–99)
Phosphorus: 3 mg/dL (ref 2.5–4.6)
Potassium: 3 mmol/L — ABNORMAL LOW (ref 3.5–5.1)
Sodium: 140 mmol/L (ref 135–145)

## 2022-09-14 LAB — URINE CULTURE: Culture: <10000 — AB

## 2022-09-14 LAB — GLUCOSE, CAPILLARY
Glucose-Capillary: 162 mg/dL — ABNORMAL HIGH (ref 70–99)
Glucose-Capillary: 306 mg/dL — ABNORMAL HIGH (ref 70–99)
Glucose-Capillary: 136 mg/dL — ABNORMAL HIGH (ref 70–99)
Glucose-Capillary: 153 mg/dL — ABNORMAL HIGH (ref 70–99)

## 2022-09-14 MED ORDER — POTASSIUM CHLORIDE CRYS ER 20 MEQ PO TBCR
40.0000 meq | EXTENDED_RELEASE_TABLET | ORAL | Status: AC
  Administered 2022-09-14 (×2): 40 meq via ORAL
  Filled 2022-09-14 (×2): qty 2

## 2022-09-14 NOTE — Progress Notes (Signed)
  Transition of Care [facility]) Screening Note   Patient Details  Name: Emily Jennings Date of Birth: 08-Aug-1934   Transition of Care [facility] CM/SW Contact:    Iona Beard, [place] Phone Number: 09/14/2022, 10:44 AM    Transition of Care Department [facility]) has reviewed patient and no TOC needs have been identified at this time. We will continue to monitor patient advancement through interdisciplinary progression rounds. If new patient transition needs arise, please place a TOC consult.

## 2022-09-14 NOTE — Progress Notes (Signed)
PROGRESS NOTE     Emily Jennings, is a 88 y.o. female, DOB - January 05, 1934, QIW:[id number]  Admit date - 09/13/2022   Admitting Physician Zinnia Tindall Denton Brick, MD  Outpatient Primary MD for the patient is Emily Helper, MD  LOS - 0  Chief Complaint  Patient presents with   Weakness        Brief Narrative:  88 y.o. female  with medical history significant of hypertension, type 2 diabetes mellitus, mild aortic stenosis, CKD stage IIIb, GERD , chronic diastolic dysfunction CHF admitted on 09/13/2022 with acute on chronic CHF exacerbation    -Assessment and Plan: 1)Acute on chronic diastolic dysfunction CHF--patient presenting with elevated BNP, shortness of breath, increasing lower extremity edema and weight gain... 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 -No significant mitral stenosis noted -Cardiology consult appreciated -PTA patient abruptly stopped and Lasix around 09/10/2022 -Cardiology recommends restarting IV Lasix at 40 mg twice daily,  -REDs Vest -Continue Aldactone -Daily weight and fluid input and output charting Wt 132 >> 130lb   2)DM2-A1c 7.4 reflecting uncontrolled diabetes with hyperglycemia -Continue Lantus insulin 12 units daily --Use Novolog/Humalog Sliding scale insulin with Accu-Cheks/Fingersticks as ordered    3)HTN-somewhat elevated at this time- -hold olmesartan -c/n Amlodipine and Coreg  may use IV Hydralazine 10 mg  Every 4 hours Prn for systolic blood pressure over 160 mmhg   4)HLD-continue Crestor   5)  CKD stage - 3B -Watch renal function closely especially with aggressive diuresis - renally adjust medications, avoid  nephrotoxic agents / dehydration  / hypotension   6) chronic anemia--probably related to underlying CKD -No bleeding concerns -Hold off on ESA/Procrit    Status is: Inpatient   Dispo: The patient is from: Home              Anticipated d/c is to: Home with [facility]               Anticipated d/c date is: 2 days              Patient currently is not medically stable to d/c. Barriers: Not Clinically Stable-   Code Status :  -  Code Status: Full Code   Family Communication:    NA (patient is alert, awake and coherent)   DVT Prophylaxis  :   - SCDs  heparin injection 5,000 Units Start: 09/13/22 1800 Place TED hose Start: 09/13/22 1717 SCDs Start: 09/13/22 1638 Place TED hose Start: 09/13/22 1638   Lab Results  Component Value Date   PLT 210 09/14/2022    Inpatient Medications  Scheduled Meds:  amLODipine  10 mg Oral Daily   aspirin EC  81 mg Oral Daily   carvedilol  12.5 mg Oral BID WC   cholecalciferol  2,000 Units Oral Daily   furosemide  40 mg Intravenous Q12H   heparin  5,000 Units Subcutaneous Q8H   insulin aspart  0-15 Units Subcutaneous TID WC   insulin aspart  0-5 Units Subcutaneous QHS   insulin glargine-yfgn  12 Units Subcutaneous Daily   potassium chloride SA  20 mEq Oral Daily   rosuvastatin  5 mg Oral Daily   sodium chloride flush  3  mL Intravenous Q12H   sodium chloride flush  3 mL Intravenous Q12H   spironolactone  25 mg Oral Daily   Continuous Infusions:  sodium chloride     cefTRIAXone (ROCEPHIN)  IV 1 g (09/14/22 1812)   PRN Meds:.sodium chloride, acetaminophen **OR** acetaminophen, bisacodyl, hydrALAZINE, ondansetron **OR** ondansetron (ZOFRAN) IV, polyethylene glycol, sodium chloride flush, traZODone   Anti-infectives (From admission, onward)    Start     Dose/Rate Route Frequency Ordered Stop   09/13/22 1830  cefTRIAXone (ROCEPHIN) 1 g in sodium chloride 0.9 % 100 mL IVPB        1 g 200 mL/hr over 30 Minutes Intravenous Every 24 hours 09/13/22 1732            Subjective: Emily Jennings today has no fevers, no emesis,  No chest pain,    - Orthopnea improving -Dyspnea on exertion persist -Voiding okay   Objective: Vitals:   09/13/22 2018 09/14/22 0457 09/14/22 0555 09/14/22 0900  BP: (!) 159/60 (!) 157/60    Pulse: 85 88    Resp: 20 18    Temp: 98.6 F (37 C) 98.9 F (37.2 C)    TempSrc: Oral Oral    SpO2: 97% 93%    Weight:   60.5 kg 59.3 kg  Height:        Intake/Output Summary (Last 24 hours) at 09/14/2022 1824 Last data filed at 09/13/2022 2320 Gross per 24 hour  Intake 220 ml  Output 600 ml  Net -380 ml   Filed Weights   09/13/22 1600 09/14/22 0555 09/14/22 0900  Weight: 60.3 kg 60.5 kg 59.3 kg    Physical Exam  Physical Examination: General appearance - alert, dyspnea on exertion persist Mental status - alert, oriented to person, place, and time,  Eyes - sclera anicteric Neck - supple,  +ve JVD elevation , Chest -, diminished breath sounds, no wheezing  heart - S1 and S2 normal, regular , 2/6 SM Abdomen - soft, nontender, nondistended, +BS Neurological - screening mental status exam normal, neck supple without rigidity, cranial nerves II through XII intact, DTR's normal and symmetric Extremities - +ve pitting pedal edema noted, intact peripheral pulses  Skin - warm, dry  Data Reviewed: I have personally reviewed following labs and imaging studies  CBC: Recent Labs  Lab 09/12/22 2209 09/14/22 0434  WBC 6.1 7.6  HGB 10.4* 9.4*  HCT 31.9* 29.1*  MCV 93.3 93.3  PLT 205 161   Basic Metabolic Panel: Recent Labs  Lab 09/12/22 1047 09/12/22 2209 09/14/22 0434  NA 136 130* 140  K 4.0 3.8 3.0*  CL 94* 92* 100  CO2 '29 29 31  '$ GLUCOSE 210* 229* 192*  BUN 26 30* 37*  CREATININE 1.82* 1.92* 1.85*  CALCIUM 9.4 8.9 8.8*  PHOS  --   --  3.0   GFR: Estimated Creatinine Clearance: 17.6 mL/min (A) (by C-G formula based on SCr of 1.85 mg/dL (H)). Liver Function Tests: Recent Labs  Lab  09/14/22 0434  ALBUMIN 3.1*   Cardiac Enzymes: No results for input(s): "CKTOTAL", "CKMB", "CKMBINDEX", "TROPONINI" in the last 168 hours. BNP (last 3 results) No results for input(s): "PROBNP" in the last 8760 hours. HbA1C: No results for input(s): "HGBA1C" in the last 72 hours. Sepsis Labs: '@LABRCNTIP'$ (procalcitonin:4,lacticidven:4) ) Recent Results (from the past 240 hour(s))  Resp panel by RT-PCR (RSV, Flu A&B, Covid) Anterior Nasal Swab     Status: None   Collection Time: 09/05/22  5:12 PM   Specimen: Anterior Nasal Swab  Result Value Ref Range Status   SARS Coronavirus 2 by RT PCR NEGATIVE NEGATIVE Final    Comment: (NOTE) SARS-CoV-2 target nucleic acids are NOT DETECTED.  The SARS-CoV-2 RNA is generally detectable in upper respiratory specimens during the acute phase of infection. The lowest concentration of SARS-CoV-2 viral copies this assay can detect is 138 copies/mL. A negative result does not preclude SARS-Cov-2 infection and should not be used as the sole basis for treatment or other patient management decisions. A negative result may occur with  improper specimen collection/handling, submission of specimen other than nasopharyngeal swab, presence of viral mutation(s) within the areas targeted by this assay, and inadequate number of viral copies(<138 copies/mL). A negative result must be combined with clinical observations, patient history, and epidemiological information. The expected result is Negative.  Fact Sheet for Patients:  EntrepreneurPulse.com.au  Fact Sheet for Healthcare Providers:  IncredibleEmployment.be  This test is no t yet approved or cleared by the [place] FDA and  has been authorized for detection and/or diagnosis of SARS-CoV-2 by FDA under an Emergency Use Authorization (EUA). This EUA will remain  in effect (meaning this test can be used) for the duration of the COVID-19 declaration under Section  564(b)(1) of the Act, 21 U.S.C.section 360bbb-3(b)(1), unless the authorization is terminated  or revoked sooner.       Influenza A by PCR NEGATIVE NEGATIVE Final   Influenza B by PCR NEGATIVE NEGATIVE Final    Comment: (NOTE) The Xpert Xpress SARS-CoV-2/FLU/RSV plus assay is intended as an aid in the diagnosis of influenza from Nasopharyngeal swab specimens and should not be used as a sole basis for treatment. Nasal washings and aspirates are unacceptable for Xpert Xpress SARS-CoV-2/FLU/RSV testing.  Fact Sheet for Patients: EntrepreneurPulse.com.au  Fact Sheet for Healthcare Providers: IncredibleEmployment.be  This test is not yet approved or cleared by the [place] FDA and has been authorized for detection and/or diagnosis of SARS-CoV-2 by FDA under an Emergency Use Authorization (EUA). This EUA will remain in effect (meaning this test can be used) for the duration of the COVID-19 declaration under Section 564(b)(1) of the Act, 21 U.S.C. section 360bbb-3(b)(1), unless the authorization is terminated or revoked.     Resp Syncytial Virus by PCR NEGATIVE NEGATIVE Final    Comment: (NOTE) Fact Sheet for Patients: EntrepreneurPulse.com.au  Fact Sheet for Healthcare Providers: IncredibleEmployment.be  This test is not yet approved or cleared by the [place] FDA and has been authorized for detection and/or diagnosis of SARS-CoV-2 by FDA under an Emergency Use Authorization (EUA). This EUA will remain in effect (meaning this test can be used) for the duration of the COVID-19 declaration under Section 564(b)(1) of the Act, 21 U.S.C. section 360bbb-3(b)(1), unless the authorization is terminated or revoked.  Performed at [facility], [address], [place], [place] [address]   Urine Culture     Status: Abnormal   Collection Time: 09/13/22  6:30 AM   Specimen: Urine, Clean Catch  Result Value  Ref Range Status   Specimen Description   Final    URINE, CLEAN CATCH Performed at [facility], [address], [place], [place] [address]    Special Requests   Final    NONE Performed at [facility], [address], [place], [place] [address]    Culture (A)  Final    <10,000 COLONIES/mL INSIGNIFICANT GROWTH Performed at [facility] [address] [place], [place] [address]    Report Status 09/14/2022 FINAL  Final  Resp  panel by RT-PCR (RSV, Flu A&B, Covid) Anterior Nasal Swab     Status: None   Collection Time: 09/13/22  7:14 AM   Specimen: Anterior Nasal Swab  Result Value Ref Range Status   SARS Coronavirus 2 by RT PCR NEGATIVE NEGATIVE Final    Comment: (NOTE) SARS-CoV-2 target nucleic acids are NOT DETECTED.  The SARS-CoV-2 RNA is generally detectable in upper respiratory specimens during the acute phase of infection. The lowest concentration of SARS-CoV-2 viral copies this assay can detect is 138 copies/mL. A negative result does not preclude SARS-Cov-2 infection and should not be used as the sole basis for treatment or other patient management decisions. A negative result may occur with  improper specimen collection/handling, submission of specimen other than nasopharyngeal swab, presence of viral mutation(s) within the areas targeted by this assay, and inadequate number of viral copies(<138 copies/mL). A negative result must be combined with clinical observations, patient history, and epidemiological information. The expected result is Negative.  Fact Sheet for Patients:  EntrepreneurPulse.com.au  Fact Sheet for Healthcare Providers:  IncredibleEmployment.be  This test is no t yet approved or cleared by the [place] FDA and  has been authorized for detection and/or diagnosis of SARS-CoV-2 by FDA under an Emergency Use Authorization (EUA). This EUA will remain  in effect (meaning this test can be used) for the  duration of the COVID-19 declaration under Section 564(b)(1) of the Act, 21 U.S.C.section 360bbb-3(b)(1), unless the authorization is terminated  or revoked sooner.       Influenza A by PCR NEGATIVE NEGATIVE Final   Influenza B by PCR NEGATIVE NEGATIVE Final    Comment: (NOTE) The Xpert Xpress SARS-CoV-2/FLU/RSV plus assay is intended as an aid in the diagnosis of influenza from Nasopharyngeal swab specimens and should not be used as a sole basis for treatment. Nasal washings and aspirates are unacceptable for Xpert Xpress SARS-CoV-2/FLU/RSV testing.  Fact Sheet for Patients: EntrepreneurPulse.com.au  Fact Sheet for Healthcare Providers: IncredibleEmployment.be  This test is not yet approved or cleared by the [place] FDA and has been authorized for detection and/or diagnosis of SARS-CoV-2 by FDA under an Emergency Use Authorization (EUA). This EUA will remain in effect (meaning this test can be used) for the duration of the COVID-19 declaration under Section 564(b)(1) of the Act, 21 U.S.C. section 360bbb-3(b)(1), unless the authorization is terminated or revoked.     Resp Syncytial Virus by PCR NEGATIVE NEGATIVE Final    Comment: (NOTE) Fact Sheet for Patients: EntrepreneurPulse.com.au  Fact Sheet for Healthcare Providers: IncredibleEmployment.be  This test is not yet approved or cleared by the [place] FDA and has been authorized for detection and/or diagnosis of SARS-CoV-2 by FDA under an Emergency Use Authorization (EUA). This EUA will remain in effect (meaning this test can be used) for the duration of the COVID-19 declaration under Section 564(b)(1) of the Act, 21 U.S.C. section 360bbb-3(b)(1), unless the authorization is terminated or revoked.  Performed at [facility], [address], [place], [place] [address]       Radiology Studies: ECHOCARDIOGRAM COMPLETE  Result Date:  09/13/2022    ECHOCARDIOGRAM REPORT   Patient Name:   HOLLIN CREWE Date of Exam: 09/13/2022 Medical Rec #:  [id number]     Height:       62.0 in Accession #:    [id number]    Weight:       132.3 lb Date of Birth:  Jul 31, 1934      BSA:  1.604 m Patient Age:    61 years      BP:           148/71 mmHg Patient Gender: F             HR:           93 bpm. Exam Location:  [facility] Procedure: 2D Echo, Cardiac Doppler and Color Doppler Indications:    Dyspnea  History:        Patient has prior history of Echocardiogram examinations, most                 recent 08/01/2022. CHF, CAD, Signs/Symptoms:Syncope, Murmur and                 Edema; Risk Factors:Hypertension, Diabetes and Dyslipidemia.                 Breast CA (resolved).  Sonographer:    Wenda Low Referring Phys: [id number] [place]  1. Left ventricular ejection fraction, by estimation, is 60 to 65%. The left ventricle has normal function. The left ventricle has no regional wall motion abnormalities. There is mild left ventricular hypertrophy. Left ventricular diastolic parameters are consistent with Grade II diastolic dysfunction (pseudonormalization). Elevated left ventricular end-diastolic pressure.  2. Right ventricular systolic function is normal. The right ventricular size is normal. There is moderately elevated pulmonary artery systolic pressure.  3. Right atrial size was mildly dilated.  4. 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 The mitral valve is abnormal. Trivial mitral valve regurgitation. No evidence of mitral stenosis.  5. Higher gradients likely from high CO DVI 0.69 and AVA aas high as 1.95 by VTI suggests not truly moderate AS Acceleration time also fairly normal . The aortic valve is tricuspid. There is moderate calcification of the aortic valve. There is moderate thickening of the aortic valve. Aortic  valve regurgitation is not visualized. Mild aortic valve stenosis.  6. The inferior vena cava is dilated in size with >50% respiratory variability, suggesting right atrial pressure of 8 mmHg. FINDINGS  Left Ventricle: Left ventricular ejection fraction, by estimation, is 60 to 65%. The left ventricle has normal function. The left ventricle has no regional wall motion abnormalities. The left ventricular internal cavity size was normal in size. There is  mild left ventricular hypertrophy. Left ventricular diastolic parameters are consistent with Grade II diastolic dysfunction (pseudonormalization). Elevated left ventricular end-diastolic pressure. Right Ventricle: The right ventricular size is normal. No increase in right ventricular wall thickness. Right ventricular systolic function is normal. There is moderately elevated pulmonary artery systolic pressure. The tricuspid regurgitant velocity is 3.52 m/s, and with an assumed right atrial pressure of 8 mmHg, the estimated right ventricular systolic pressure is 26.8 mmHg. Left Atrium: Left atrial size was normal in size. Right Atrium: Right atrial size was mildly dilated. Pericardium: Trivial pericardial effusion is present. The pericardial effusion is posterior to the left ventricle. 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 The mitral valve is abnormal. There is moderate thickening of the mitral valve leaflet(s). Trivial mitral valve regurgitation. No evidence of mitral valve stenosis. MV peak gradient, 12.8 mmHg. The mean mitral valve gradient is 6.0 mmHg. Tricuspid Valve: The tricuspid valve is normal in structure. Tricuspid valve regurgitation is trivial. No  evidence of tricuspid stenosis. Aortic Valve: Higher gradients likely from high CO DVI 0.69 and AVA aas high as 1.95 by VTI suggests not truly moderate AS Acceleration time also fairly  normal. The aortic valve is tricuspid. There is moderate calcification of the aortic valve. There is moderate thickening of the aortic valve. Aortic valve regurgitation is not visualized. Mild aortic stenosis is present. Aortic valve mean gradient measures 20.7 mmHg. Aortic valve peak gradient measures 41.3 mmHg. Aortic valve area, by VTI measures 1.95 cm. Pulmonic Valve: The pulmonic valve was normal in structure. Pulmonic valve regurgitation is mild. No evidence of pulmonic stenosis. Aorta: The aortic root is normal in size and structure. Venous: The inferior vena cava is dilated in size with greater than 50% respiratory variability, suggesting right atrial pressure of 8 mmHg. IAS/Shunts: No atrial level shunt detected by color flow Doppler.  LEFT VENTRICLE PLAX 2D LVIDd:         4.30 cm   Diastology LVIDs:         1.70 cm   LV e' medial:    6.85 cm/s LV PW:         1.20 cm   LV E/e' medial:  23.5 LV IVS:        1.20 cm   LV e' lateral:   11.30 cm/s LVOT diam:     1.90 cm   LV E/e' lateral: 14.2 LV SV:         126 LV SV Index:   78 LVOT Area:     2.84 cm  RIGHT VENTRICLE RV Basal diam:  2.90 cm RV Mid diam:    2.00 cm RV S prime:     18.10 cm/s TAPSE (M-mode): 2.7 cm LEFT ATRIUM             Index        RIGHT ATRIUM           Index LA diam:        3.70 cm 2.31 cm/m   RA Area:     17.90 cm LA Vol (A2C):   64.5 ml 40.22 ml/m  RA Volume:   42.30 ml  26.38 ml/m LA Vol (A4C):   60.3 ml 37.60 ml/m LA Biplane Vol: 63.3 ml 39.47 ml/m  AORTIC VALVE                     PULMONIC VALVE AV Area (Vmax):    1.62 cm      PV Vmax:       1.15 m/s AV Area (Vmean):   1.90 cm      PV Peak grad:  5.3 mmHg AV Area (VTI):     1.95 cm AV Vmax:           321.33 cm/s AV Vmean:          208.667 cm/s AV VTI:            0.643 m AV Peak Grad:      41.3 mmHg AV Mean Grad:      20.7 mmHg LVOT Vmax:         184.00 cm/s LVOT Vmean:        140.000 cm/s LVOT VTI:          0.443 m LVOT/AV VTI ratio: 0.69  AORTA Ao Root diam: 3.00 cm MITRAL  VALVE                TRICUSPID VALVE MV Area (PHT): 3.99 cm  TR Peak grad:   49.6 mmHg MV Area VTI:   3.68 cm     TR Vmax:        352.00 cm/s MV Peak grad:  12.8 mmHg MV Mean grad:  6.0 mmHg     SHUNTS MV Vmax:       1.79 m/s     Systemic VTI:  0.44 m MV Vmean:      112.0 cm/s   Systemic Diam: 1.90 cm MV Decel Time: 190 msec MV E velocity: 161.00 cm/s MV A velocity: 135.00 cm/s MV E/A ratio:  1.19 Jenkins Rouge MD Electronically signed by Jenkins Rouge MD Signature Date/Time: 09/13/2022/4:20:14 PM    Final    DG Chest Portable 1 View  Result Date: 09/13/2022 CLINICAL DATA:  88 year old female with shortness of breath. EXAM: PORTABLE CHEST 1 VIEW COMPARISON:  Portable chest 09/06/2022 and earlier. FINDINGS: Portable AP upright view at 0728 hours. Stable lung volumes and cardiomegaly. Calcified aortic atherosclerosis. Other mediastinal contours are within normal limits. Visualized tracheal air column is within normal limits. Chronic left lung base hypo ventilation is stable. Elsewhere when allowing for portable technique the lungs are clear. Hair artifact suspected at the upper chest greater on the left. No acute osseous abnormality identified. Paucity of bowel gas in the visible abdomen. IMPRESSION: No acute cardiopulmonary abnormality. Mild cardiomegaly. Aortic Atherosclerosis (ICD10-I70.0). Electronically Signed   By: Genevie Ann M.D.   On: 09/13/2022 07:48     Scheduled Meds:  amLODipine  10 mg Oral Daily   aspirin EC  81 mg Oral Daily   carvedilol  12.5 mg Oral BID WC   cholecalciferol  2,000 Units Oral Daily   furosemide  40 mg Intravenous Q12H   heparin  5,000 Units Subcutaneous Q8H   insulin aspart  0-15 Units Subcutaneous TID WC   insulin aspart  0-5 Units Subcutaneous QHS   insulin glargine-yfgn  12 Units Subcutaneous Daily   potassium chloride SA  20 mEq Oral Daily   rosuvastatin  5 mg Oral Daily   sodium chloride flush  3 mL Intravenous Q12H   sodium chloride flush  3 mL Intravenous  Q12H   spironolactone  25 mg Oral Daily   Continuous Infusions:  sodium chloride     cefTRIAXone (ROCEPHIN)  IV 1 g (09/14/22 1812)     LOS: 0 days    Roxan Hockey M.D on 09/14/2022 at 6:24 PM  Go to [email or web address] - for contact info  [facility] - Office  [phone]  If 7PM-7AM, please contact night-coverage [email or web address] 09/14/2022, 6:24 PM

## 2022-09-14 NOTE — Progress Notes (Signed)
Cardiologist:  Zandra Abts  Subjective:  Breathing improved eating breakfast  Objective:  Vitals:   09/13/22 1600 09/13/22 2018 09/14/22 0457 09/14/22 0555  BP: (!) 170/63 (!) 159/60 (!) 157/60   Pulse: 96 85 88   Resp: '18 20 18   '$ Temp: 98.5 F (36.9 C) 98.6 F (37 C) 98.9 F (37.2 C)   TempSrc: Oral Oral Oral   SpO2: 99% 97% 93%   Weight: 60.3 kg   60.5 kg  Height: 5' (1.524 m)       Intake/Output from previous day:  Intake/Output Summary (Last 24 hours) at 09/14/2022 0809 Last data filed at 09/13/2022 2320 Gross per 24 hour  Intake 220 ml  Output 600 ml  Net -380 ml    Physical Exam: Black female in no distress Restricted inspiratory intake no acitve wheezing SEM 4/6 AS Abdomen benign Trace edema  Lab Results: Basic Metabolic Panel: Recent Labs    09/12/22 2209 09/14/22 0434  NA 130* 140  K 3.8 3.0*  CL 92* 100  CO2 29 31  GLUCOSE 229* 192*  BUN 30* 37*  CREATININE 1.92* 1.85*  CALCIUM 8.9 8.8*  PHOS  --  3.0   Liver Function Tests: Recent Labs    09/14/22 0434  ALBUMIN 3.1*   No results for input(s): "LIPASE", "AMYLASE" in the last 72 hours. CBC: Recent Labs    09/12/22 2209 09/14/22 0434  WBC 6.1 7.6  HGB 10.4* 9.4*  HCT 31.9* 29.1*  MCV 93.3 93.3  PLT 205 210     Imaging: ECHOCARDIOGRAM COMPLETE  Result Date: 09/13/2022    ECHOCARDIOGRAM REPORT   Patient Name:   Emily Jennings Date of Exam: 09/13/2022 Medical Rec #:  [id number]     Height:       62.0 in Accession #:    [id number]    Weight:       132.3 lb Date of Birth:  August 16, 1934      BSA:          1.604 m Patient Age:    88 years      BP:           148/71 mmHg Patient Gender: F             HR:           93 bpm. Exam Location:  [facility] Procedure: 2D Echo, Cardiac Doppler and Color Doppler Indications:    Dyspnea  History:        Patient has prior history of Echocardiogram examinations, most                 recent 08/01/2022. CHF, CAD, Signs/Symptoms:Syncope, Murmur and                  Edema; Risk Factors:Hypertension, Diabetes and Dyslipidemia.                 Breast CA (resolved).  Sonographer:    Wenda Low Referring Phys: [id number] Opp  1. Left ventricular ejection fraction, by estimation, is 60 to 65%. The left ventricle has normal function. The left ventricle has no regional wall motion abnormalities. There is mild left ventricular hypertrophy. Left ventricular diastolic parameters are consistent with Grade II diastolic dysfunction (pseudonormalization). Elevated left ventricular end-diastolic pressure.  2. Right ventricular systolic function is normal. The right ventricular size is normal. There is moderately elevated pulmonary artery systolic pressure.  3. Right atrial size was mildly dilated.  4. 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 The mitral valve is abnormal. Trivial mitral valve regurgitation. No evidence of mitral stenosis.  5. Higher gradients likely from high CO DVI 0.69 and AVA aas high as 1.95 by VTI suggests not truly moderate AS Acceleration time also fairly normal . The aortic valve is tricuspid. There is moderate calcification of the aortic valve. There is moderate thickening of the aortic valve. Aortic valve regurgitation is not visualized. Mild aortic valve stenosis.  6. The inferior vena cava is dilated in size with >50% respiratory variability, suggesting right atrial pressure of 8 mmHg. FINDINGS  Left Ventricle: Left ventricular ejection fraction, by estimation, is 60 to 65%. The left ventricle has normal function. The left ventricle has no regional wall motion abnormalities. The left ventricular internal cavity size was normal in size. There is  mild left ventricular hypertrophy. Left ventricular diastolic parameters are consistent with Grade II diastolic dysfunction (pseudonormalization). Elevated left ventricular  end-diastolic pressure. Right Ventricle: The right ventricular size is normal. No increase in right ventricular wall thickness. Right ventricular systolic function is normal. There is moderately elevated pulmonary artery systolic pressure. The tricuspid regurgitant velocity is 3.52 m/s, and with an assumed right atrial pressure of 8 mmHg, the estimated right ventricular systolic pressure is 33.8 mmHg. Left Atrium: Left atrial size was normal in size. Right Atrium: Right atrial size was mildly dilated. Pericardium: Trivial pericardial effusion is present. The pericardial effusion is posterior to the left ventricle. 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 The mitral valve is abnormal. There is moderate thickening of the mitral valve leaflet(s). Trivial mitral valve regurgitation. No evidence of mitral valve stenosis. MV peak gradient, 12.8 mmHg. The mean mitral valve gradient is 6.0 mmHg. Tricuspid Valve: The tricuspid valve is normal in structure. Tricuspid valve regurgitation is trivial. No evidence of tricuspid stenosis. Aortic Valve: Higher gradients likely from high CO DVI 0.69 and AVA aas high as 1.95 by VTI suggests not truly moderate AS Acceleration time also fairly normal. The aortic valve is tricuspid. There is moderate calcification of the aortic valve. There is moderate thickening of the aortic valve. Aortic valve regurgitation is not visualized. Mild aortic stenosis is present. Aortic valve mean gradient measures 20.7 mmHg. Aortic valve peak gradient measures 41.3 mmHg. Aortic valve area, by VTI measures 1.95 cm. Pulmonic Valve: The pulmonic valve was normal in structure. Pulmonic valve regurgitation is mild. No evidence of pulmonic stenosis. Aorta: The aortic root is normal in size and structure. Venous: The inferior vena cava is dilated in size with greater than 50% respiratory  variability, suggesting right atrial pressure of 8 mmHg. IAS/Shunts: No atrial level shunt detected by color flow Doppler.  LEFT VENTRICLE PLAX 2D LVIDd:         4.30 cm   Diastology LVIDs:         1.70 cm   LV e' medial:    6.85 cm/s LV PW:         1.20 cm   LV E/e' medial:  23.5 LV IVS:        1.20 cm   LV e' lateral:   11.30 cm/s LVOT diam:     1.90 cm   LV E/e' lateral: 14.2 LV SV:         126  LV SV Index:   78 LVOT Area:     2.84 cm  RIGHT VENTRICLE RV Basal diam:  2.90 cm RV Mid diam:    2.00 cm RV S prime:     18.10 cm/s TAPSE (M-mode): 2.7 cm LEFT ATRIUM             Index        RIGHT ATRIUM           Index LA diam:        3.70 cm 2.31 cm/m   RA Area:     17.90 cm LA Vol (A2C):   64.5 ml 40.22 ml/m  RA Volume:   42.30 ml  26.38 ml/m LA Vol (A4C):   60.3 ml 37.60 ml/m LA Biplane Vol: 63.3 ml 39.47 ml/m  AORTIC VALVE                     PULMONIC VALVE AV Area (Vmax):    1.62 cm      PV Vmax:       1.15 m/s AV Area (Vmean):   1.90 cm      PV Peak grad:  5.3 mmHg AV Area (VTI):     1.95 cm AV Vmax:           321.33 cm/s AV Vmean:          208.667 cm/s AV VTI:            0.643 m AV Peak Grad:      41.3 mmHg AV Mean Grad:      20.7 mmHg LVOT Vmax:         184.00 cm/s LVOT Vmean:        140.000 cm/s LVOT VTI:          0.443 m LVOT/AV VTI ratio: 0.69  AORTA Ao Root diam: 3.00 cm MITRAL VALVE                TRICUSPID VALVE MV Area (PHT): 3.99 cm     TR Peak grad:   49.6 mmHg MV Area VTI:   3.68 cm     TR Vmax:        352.00 cm/s MV Peak grad:  12.8 mmHg MV Mean grad:  6.0 mmHg     SHUNTS MV Vmax:       1.79 m/s     Systemic VTI:  0.44 m MV Vmean:      112.0 cm/s   Systemic Diam: 1.90 cm MV Decel Time: 190 msec MV E velocity: 161.00 cm/s MV A velocity: 135.00 cm/s MV E/A ratio:  1.19 Jenkins Rouge MD Electronically signed by Jenkins Rouge MD Signature Date/Time: 09/13/2022/4:20:14 PM    Final    DG Chest Portable 1 View  Result Date: 09/13/2022 CLINICAL DATA:  88 year old female with shortness of  breath. EXAM: PORTABLE CHEST 1 VIEW COMPARISON:  Portable chest 09/06/2022 and earlier. FINDINGS: Portable AP upright view at 0728 hours. Stable lung volumes and cardiomegaly. Calcified aortic atherosclerosis. Other mediastinal contours are within normal limits. Visualized tracheal air column is within normal limits. Chronic left lung base hypo ventilation is stable. Elsewhere when allowing for portable technique the lungs are clear. Hair artifact suspected at the upper chest greater on the left. No acute osseous abnormality identified. Paucity of bowel gas in the visible abdomen. IMPRESSION: No acute cardiopulmonary abnormality. Mild cardiomegaly. Aortic Atherosclerosis (ICD10-I70.0). Electronically Signed   By: Genevie Ann M.D.   On: 09/13/2022 07:48  Cardiac Studies:  ECG: NSR normal ECG   Telemetry: NSR 09/14/2022   Echo: EF 60-65% moderate AS trivial MR   Medications:    amLODipine  10 mg Oral Daily   aspirin EC  81 mg Oral Daily   carvedilol  12.5 mg Oral BID WC   cholecalciferol  2,000 Units Oral Daily   furosemide  40 mg Intravenous Q12H   heparin  5,000 Units Subcutaneous Q8H   insulin aspart  0-15 Units Subcutaneous TID WC   insulin aspart  0-5 Units Subcutaneous QHS   insulin glargine-yfgn  12 Units Subcutaneous Daily   potassium chloride SA  20 mEq Oral Daily   rosuvastatin  5 mg Oral Daily   sodium chloride flush  3 mL Intravenous Q12H   sodium chloride flush  3 mL Intravenous Q12H   spironolactone  25 mg Oral Daily      sodium chloride     cefTRIAXone (ROCEPHIN)  IV 1 g (09/13/22 2109)    Assessment/Plan:   CHF:  diastolic with moderate AS continue aldactone and lasix supplement K continue iv diuresis one more day D/c with lasix 60 mg bid and aldactone 25 mg daily Should be ready for D/c in am will arrange f/u with Dr Harl Bowie TOC AS:  moderate stable also ? Ruptured secondary chord without significant MR DM:  Discussed low carb diet.  Target hemoglobin A1c is 6.5 or less.   Continue current medications. HLD continue statin  HTN  improved continue norvasc and core along with diuretic  Ok to d/c in am outpatient f/u arranged Cardiology will sign off   Jenkins Rouge 09/14/2022, 8:09 AM

## 2022-09-15 ENCOUNTER — Inpatient Hospital Stay (HOSPITAL_COMMUNITY): Payer: Medicare Other

## 2022-09-15 DIAGNOSIS — I5033 Acute on chronic diastolic (congestive) heart failure: Secondary | ICD-10-CM | POA: Diagnosis not present

## 2022-09-15 LAB — GLUCOSE, CAPILLARY
Glucose-Capillary: 188 mg/dL — ABNORMAL HIGH (ref 70–99)
Glucose-Capillary: 217 mg/dL — ABNORMAL HIGH (ref 70–99)
Glucose-Capillary: 169 mg/dL — ABNORMAL HIGH (ref 70–99)
Glucose-Capillary: 148 mg/dL — ABNORMAL HIGH (ref 70–99)

## 2022-09-15 LAB — RENAL FUNCTION PANEL
Albumin: 3.4 g/dL — ABNORMAL LOW (ref 3.5–5.0)
Anion gap: 8 (ref 5–15)
BUN: 31 mg/dL — ABNORMAL HIGH (ref 8–23)
CO2: 31 mmol/L (ref 22–32)
Calcium: 8.6 mg/dL — ABNORMAL LOW (ref 8.9–10.3)
Chloride: 101 mmol/L (ref 98–111)
Creatinine, Ser: 1.82 mg/dL — ABNORMAL HIGH (ref 0.44–1.00)
GFR, Estimated: 27 mL/min — ABNORMAL LOW (ref >60)
Glucose, Bld: 219 mg/dL — ABNORMAL HIGH (ref 70–99)
Phosphorus: 2.5 mg/dL (ref 2.5–4.6)
Potassium: 3.6 mmol/L (ref 3.5–5.1)
Sodium: 140 mmol/L (ref 135–145)

## 2022-09-15 LAB — BASIC METABOLIC PANEL
Anion gap: 9 (ref 5–15)
BUN: 31 mg/dL — ABNORMAL HIGH (ref 8–23)
CO2: 30 mmol/L (ref 22–32)
Calcium: 8.6 mg/dL — ABNORMAL LOW (ref 8.9–10.3)
Chloride: 100 mmol/L (ref 98–111)
Creatinine, Ser: 1.67 mg/dL — ABNORMAL HIGH (ref 0.44–1.00)
GFR, Estimated: 30 mL/min — ABNORMAL LOW (ref >60)
Glucose, Bld: 219 mg/dL — ABNORMAL HIGH (ref 70–99)
Potassium: 3.5 mmol/L (ref 3.5–5.1)
Sodium: 139 mmol/L (ref 135–145)

## 2022-09-15 LAB — MAGNESIUM: Magnesium: 2 mg/dL (ref 1.7–2.4)

## 2022-09-15 LAB — BRAIN NATRIURETIC PEPTIDE: B Natriuretic Peptide: 827 pg/mL — ABNORMAL HIGH (ref 0.0–100.0)

## 2022-09-15 MED ORDER — FUROSEMIDE 10 MG/ML IJ SOLN
40.0000 mg | Freq: Once | INTRAMUSCULAR | Status: AC
  Administered 2022-09-15: 40 mg via INTRAVENOUS
  Filled 2022-09-15: qty 4

## 2022-09-15 MED ORDER — ACETAZOLAMIDE SODIUM 500 MG IJ SOLR
250.0000 mg | Freq: Every morning | INTRAMUSCULAR | Status: AC
  Administered 2022-09-16 – 2022-09-17 (×2): 250 mg via INTRAVENOUS
  Filled 2022-09-15 (×2): qty 500

## 2022-09-15 NOTE — Progress Notes (Signed)
PROGRESS NOTE     Emily Jennings, is a 88 y.o. female, DOB - [birth year], ION:[id number]  Admit date - 09/13/2022   Admitting Physician Aeron Donaghey Denton Brick, MD  Outpatient Primary MD for the patient is [name], MD  LOS - 1  Chief Complaint  Patient presents with   Weakness        Brief Narrative:  88 y.o. female  with medical history significant of hypertension, type 2 diabetes mellitus, mild aortic stenosis, CKD stage IIIb, GERD , chronic diastolic dysfunction CHF admitted on 09/13/2022 with acute on chronic CHF exacerbation    -Assessment and Plan: 1)Acute on chronic diastolic dysfunction CHF--patient presenting with elevated BNP, shortness of breath, increasing lower extremity edema and weight gain... 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 -No significant mitral stenosis noted -Cardiology consult appreciated -PTA patient abruptly stopped and Lasix around 09/10/2022 -Cardiology recommends restarting IV Lasix at 40 mg twice daily,  -REDs Vest -Continue Aldactone -Daily weight and fluid input and output charting BNP is trending up 710 >>827 Wt 132 >> 130>>127.7 -Continue IV Lasix at 40 mg every 12 hours =-Consider adding Diamox -Repeat chest x-ray in a.m.   2)DM2-A1c 7.4 reflecting uncontrolled diabetes with hyperglycemia -Continue Lantus insulin 12 units daily --Use Novolog/Humalog Sliding scale insulin with Accu-Cheks/Fingersticks as ordered    3)HTN- - -hold olmesartan -c/n Amlodipine and Coreg  may use IV Hydralazine 10 mg  Every 4 hours Prn for systolic blood pressure over 160 mmhg   4)HLD-continue Crestor   5)  CKD stage -  3B -Watch renal function closely especially with aggressive diuresis -Creatinine actually trending down a bit with diuresis - renally adjust medications, avoid nephrotoxic agents / dehydration  / hypotension   6) chronic anemia--probably related to underlying CKD -No bleeding concerns -Hold off on ESA/Procrit  -Negative urine culture will stop IV Rocephin    Status is: Inpatient   Dispo: The patient is from: Home              Anticipated d/c is to: Home with [facility]               Anticipated d/c date is: 2 days              Patient currently is not medically stable to d/c. Barriers: Not Clinically Stable-   Code Status :  -  Code Status: Full Code   Family Communication:    NA (patient is alert, awake and coherent)   DVT Prophylaxis  :   - SCDs  heparin injection 5,000 Units Start: 09/13/22 1800 Place TED hose Start: 09/13/22 1717 SCDs Start: 09/13/22 1638 Place TED hose Start: 09/13/22 1638   Lab Results  Component Value Date   PLT 210 09/14/2022    Inpatient Medications  Scheduled Meds:  [START ON 09/16/2022] acetaZOLAMIDE  250 mg Intravenous q morning   amLODipine  10 mg Oral Daily   aspirin EC  81 mg Oral Daily   carvedilol  12.5 mg Oral BID WC   cholecalciferol  2,000 Units Oral Daily   furosemide  40 mg Intravenous Q12H   heparin  5,000 Units Subcutaneous Q8H   insulin aspart  0-15 Units Subcutaneous TID WC   insulin aspart  0-5 Units Subcutaneous QHS   insulin glargine-yfgn  12 Units Subcutaneous Daily   potassium chloride SA  20 mEq Oral Daily   rosuvastatin  5 mg Oral Daily   sodium chloride flush  3 mL Intravenous Q12H   sodium chloride flush  3 mL Intravenous Q12H   spironolactone  25 mg Oral Daily   Continuous Infusions:  sodium chloride     cefTRIAXone (ROCEPHIN)  IV Stopped (09/15/22 1950)   PRN Meds:.sodium chloride, acetaminophen **OR** acetaminophen, bisacodyl, hydrALAZINE, ondansetron **OR** ondansetron (ZOFRAN) IV, polyethylene glycol, sodium  chloride flush, traZODone   Anti-infectives (From admission, onward)    Start     Dose/Rate Route Frequency Ordered Stop   09/13/22 1830  cefTRIAXone (ROCEPHIN) 1 g in sodium chloride 0.9 % 100 mL IVPB        1 g 200 mL/hr over 30 Minutes Intravenous Every 24 hours 09/13/22 1732         Subjective: Emily Jennings today has no fevers, no emesis,  No chest pain,    -Patient with increasing dyspnea overnight was placed on oxygen overnight -Voiding well   Objective: Vitals:   09/15/22 0004 09/15/22 0556 09/15/22 0628 09/15/22 1546  BP: (!) 157/70 (!) 179/70  (!) 149/72  Pulse: 81 83  77  Resp:    20  Temp: 98 F (36.7 C) 98.4 F (36.9 C)  98.4 F (36.9 C)  TempSrc: Oral Oral  Oral  SpO2: 100% 99%  100%  Weight:   57.9 kg   Height:        Intake/Output Summary (Last 24 hours) at 09/15/2022 1954 Last data filed at 09/15/2022 1252 Gross per 24 hour  Intake --  Output 2600 ml  Net -2600 ml   Filed Weights   09/14/22 0555 09/14/22 0900 09/15/22 0628  Weight: 60.5 kg 59.3 kg 57.9 kg    Physical Exam  Physical Examination: General appearance - alert, dyspnea on exertion persist Mental status - alert, oriented to person, place, and time,  Eyes - sclera anicteric Neck - supple,  +ve JVD elevation , Chest -, diminished breath sounds, no wheezing  heart - S1 and S2 normal, regular , 2/6 SM Abdomen - soft, nontender, nondistended, +BS Neurological - screening mental status exam normal, neck supple without rigidity, cranial nerves II through XII intact, DTR's normal and symmetric Extremities - +ve pitting pedal edema noted, intact peripheral pulses  Skin - warm, dry  Data Reviewed: I have personally reviewed following labs and imaging studies  CBC: Recent Labs  Lab 09/12/22 2209 09/14/22 0434  WBC 6.1 7.6  HGB 10.4* 9.4*  HCT 31.9* 29.1*  MCV 93.3 93.3  PLT 205 154   Basic Metabolic Panel: Recent Labs  Lab 09/12/22 1047 09/12/22 2209 09/14/22 0434  09/15/22 0032  NA 136 130* 140 140  139  K 4.0 3.8 3.0* 3.6  3.5  CL 94* 92* 100 101  100  CO2 '29 29 31 31  30  '$ GLUCOSE 210* 229* 192* 219*  219*  BUN 26 30* 37* 31*  31*  CREATININE 1.82* 1.92* 1.85* 1.82*  1.67*  CALCIUM 9.4 8.9 8.8* 8.6*  8.6*  MG  --   --   --  2.0  PHOS  --   --  3.0 2.5   GFR: Estimated Creatinine Clearance: 17.7 mL/min (A) (by C-G formula based on SCr of 1.82 mg/dL (H)). Liver Function Tests: Recent Labs  Lab 09/14/22  5456 09/15/22 0032  ALBUMIN 3.1* 3.4*   Recent Results (from the past 240 hour(s))  Urine Culture     Status: Abnormal   Collection Time: 09/13/22  6:30 AM   Specimen: Urine, Clean Catch  Result Value Ref Range Status   Specimen Description   Final    URINE, CLEAN CATCH Performed at [facility], [address] [place], [place] [address]    Special Requests   Final    NONE Performed at [facility], [address], [place], [place] [address]    Culture (A)  Final    <10,000 COLONIES/mL INSIGNIFICANT GROWTH Performed at [facility] [address], [place],  [address]    Report Status 09/14/2022 FINAL  Final  Resp panel by RT-PCR (RSV, Flu A&B, Covid) Anterior Nasal Swab     Status: None   Collection Time: 09/13/22  7:14 AM   Specimen: Anterior Nasal Swab  Result Value Ref Range Status   SARS Coronavirus 2 by RT PCR NEGATIVE NEGATIVE Final    Comment: (NOTE) SARS-CoV-2 target nucleic acids are NOT DETECTED.  The SARS-CoV-2 RNA is generally detectable in upper respiratory specimens during the acute phase of infection. The lowest concentration of SARS-CoV-2 viral copies this assay can detect is 138 copies/mL. A negative result does not preclude SARS-Cov-2 infection and should not be used as the sole basis for treatment or other patient management decisions. A negative result may occur with  improper specimen collection/handling, submission of specimen other than nasopharyngeal swab, presence of viral  mutation(s) within the areas targeted by this assay, and inadequate number of viral copies(<138 copies/mL). A negative result must be combined with clinical observations, patient history, and epidemiological information. The expected result is Negative.  Fact Sheet for Patients:  EntrepreneurPulse.com.au  Fact Sheet for Healthcare Providers:  IncredibleEmployment.be  This test is no t yet approved or cleared by the [place] FDA and  has been authorized for detection and/or diagnosis of SARS-CoV-2 by FDA under an Emergency Use Authorization (EUA). This EUA will remain  in effect (meaning this test can be used) for the duration of the COVID-19 declaration under Section 564(b)(1) of the Act, 21 U.S.C.section 360bbb-3(b)(1), unless the authorization is terminated  or revoked sooner.       Influenza A by PCR NEGATIVE NEGATIVE Final   Influenza B by PCR NEGATIVE NEGATIVE Final    Comment: (NOTE) The Xpert Xpress SARS-CoV-2/FLU/RSV plus assay is intended as an aid in the diagnosis of influenza from Nasopharyngeal swab specimens and should not be used as a sole basis for treatment. Nasal washings and aspirates are unacceptable for Xpert Xpress SARS-CoV-2/FLU/RSV testing.  Fact Sheet for Patients: EntrepreneurPulse.com.au  Fact Sheet for Healthcare Providers: IncredibleEmployment.be  This test is not yet approved or cleared by the [place] FDA and has been authorized for detection and/or diagnosis of SARS-CoV-2 by FDA under an Emergency Use Authorization (EUA). This EUA will remain in effect (meaning this test can be used) for the duration of the COVID-19 declaration under Section 564(b)(1) of the Act, 21 U.S.C. section 360bbb-3(b)(1), unless the authorization is terminated or revoked.     Resp Syncytial Virus by PCR NEGATIVE NEGATIVE Final    Comment: (NOTE) Fact Sheet for  Patients: EntrepreneurPulse.com.au  Fact Sheet for Healthcare Providers: IncredibleEmployment.be  This test is not yet approved or cleared by the [place] FDA and has been authorized for detection and/or diagnosis of SARS-CoV-2 by FDA under an Emergency Use Authorization (EUA). This EUA  will remain in effect (meaning this test can be used) for the duration of the COVID-19 declaration under Section 564(b)(1) of the Act, 21 U.S.C. section 360bbb-3(b)(1), unless the authorization is terminated or revoked.  Performed at [facility], [address], [place], [place] [address]       Radiology Studies: DG CHEST PORT 1 VIEW  Result Date: 09/15/2022 CLINICAL DATA:  Dyspnea EXAM: PORTABLE CHEST 1 VIEW COMPARISON:  09/13/2022 FINDINGS: Stable cardiomegaly. Aortic atherosclerotic calcification. Chronic coarsening of the interstitium with peribronchovascular crowding in the lower lungs. No pleural effusion or pneumothorax. IMPRESSION: Stable exam compared to 09/13/2022. Chronic bronchitic change. Cardiomegaly. Electronically Signed   By: Placido Sou M.D.   On: 09/15/2022 00:43     Scheduled Meds:  [START ON 09/16/2022] acetaZOLAMIDE  250 mg Intravenous q morning   amLODipine  10 mg Oral Daily   aspirin EC  81 mg Oral Daily   carvedilol  12.5 mg Oral BID WC   cholecalciferol  2,000 Units Oral Daily   furosemide  40 mg Intravenous Q12H   heparin  5,000 Units Subcutaneous Q8H   insulin aspart  0-15 Units Subcutaneous TID WC   insulin aspart  0-5 Units Subcutaneous QHS   insulin glargine-yfgn  12 Units Subcutaneous Daily   potassium chloride SA  20 mEq Oral Daily   rosuvastatin  5 mg Oral Daily   sodium chloride flush  3 mL Intravenous Q12H   sodium chloride flush  3 mL Intravenous Q12H   spironolactone  25 mg Oral Daily   Continuous Infusions:  sodium chloride     cefTRIAXone (ROCEPHIN)  IV Stopped (09/15/22 1950)     LOS: 1 day     Roxan Hockey M.D on 09/15/2022 at 7:54 PM  Go to [email or web address] - for contact info  [facility] - Office  [phone]  If 7PM-7AM, please contact night-coverage [email or web address] 09/15/2022, 7:54 PM

## 2022-09-15 NOTE — TOC Initial Note (Signed)
Transition of Care [facility]) - Initial/Assessment Note    Patient Details  Name: Emily Jennings MRN: [id number] Date of Birth: [birth year]  Transition of Care [facility]) CM/SW Contact:    Iona Beard, [place] Phone Number: 09/15/2022, 1:57 PM  Clinical Narrative:                 Pt is high risk for readmission. CSW spoke with pt and spouse in room to complete assessment. Pt lives with her husband. Pt states that she is able to complete her ADL's independently. Pt can drive but states that he family provides transportation. Pt has had HH in the past. Pt states that she does not use any DME. CSW updated by Deloria Lair, NP with ventricular health who states that she will follow up with pt once she is D/C. TOC to follow.   Expected Discharge Plan: Home/Self Care Barriers to Discharge: Continued Medical Work up   Patient Goals and CMS Choice Patient states their goals for this hospitalization and ongoing recovery are:: return home CMS Medicare.gov Compare Post Acute Care list provided to:: Patient Choice offered to / list presented to : Patient      Expected Discharge Plan and Services In-house Referral: Clinical Social Work Discharge Planning Services: CM Consult   Living arrangements for the past 2 months: Single Family Home                                      Prior Living Arrangements/Services Living arrangements for the past 2 months: Single Family Home Lives with:: Spouse Patient language and need for interpreter reviewed:: Yes Do you feel safe going back to the place where you live?: Yes      Need for Family Participation in Patient Care: Yes (Comment) Care giver support system in place?: Yes (comment)   Criminal Activity/Legal Involvement Pertinent to Current Situation/Hospitalization: No - Comment as needed  Activities of Daily Living Home Assistive Devices/Equipment: None ADL Screening (condition at time of admission) Patient's cognitive ability adequate to  safely complete daily activities?: Yes Is the patient deaf or have difficulty hearing?: No Does the patient have difficulty seeing, even when wearing glasses/contacts?: No Does the patient have difficulty concentrating, remembering, or making decisions?: No Patient able to express need for assistance with ADLs?: Yes Does the patient have difficulty dressing or bathing?: No Independently performs ADLs?: Yes (appropriate for developmental age) Does the patient have difficulty walking or climbing stairs?: No Weakness of Legs: None Weakness of Arms/Hands: None  Permission Sought/Granted                  Emotional Assessment Appearance:: Appears stated age Attitude/Demeanor/Rapport: Engaged Affect (typically observed): Accepting Orientation: : Oriented to Self, Oriented to Place, Oriented to  Time, Oriented to Situation Alcohol / Substance Use: Not Applicable Psych Involvement: No (comment)  Admission diagnosis:  SOB (shortness of breath) [R06.02] Acute exacerbation of CHF (congestive heart failure) (HCC) [I50.9] Patient Active Problem List   Diagnosis Date Noted   Acute exacerbation of CHF (congestive heart failure) ([place]) 09/13/2022   Acute on chronic congestive heart failure with left ventricular diastolic dysfunction ([place]) 09/13/2022   Chronic kidney disease (CKD) stage G3b/A2, moderately decreased glomerular filtration rate (GFR) between 30-44 mL/min/1.73 square meter and albuminuria creatinine ratio between 30-299 mg/g ([place]) 09/13/2022   Mild aortic valve stenosis 09/13/2022   Dyspnea on exertion 09/05/2022   CHF (congestive heart failure) (  [place]) 09/05/2022   Acute on chronic diastolic (congestive) heart failure ([place]) 09/05/2022   Hyperkalemia 04/09/2022   Hyperglycemic crisis due to diabetes mellitus ([place]) 03/25/2022   Hyponatremia 97/98/9211   Acute metabolic encephalopathy 94/17/4081   Hypothermia 03/22/2022   Heart murmur 03/22/2022   Bilateral lower extremity edema  03/22/2022   Syncope and collapse 01/13/2021   Lip swelling 10/30/2020   Right leg pain 10/30/2020   Tubular adenoma of colon 10/26/2017   Osteoporosis 07/28/2014   Diabetic hypertension-nephrosis syndrome ([place]) 03/23/2014   Stage 3b chronic kidney disease ([place]) 03/23/2014   Type 2 diabetes mellitus with stage 3a chronic kidney disease, with long-term current use of insulin ([place]) 09/29/2013   Allergic rhinitis 01/27/2013   GERD (gastroesophageal reflux disease) 01/12/2013   CVD (cardiovascular disease) 07/17/2011   Type 2 diabetes mellitus ([place]) 01/22/2011   Hypokalemia 08/31/2008   Mixed hyperlipidemia 07/15/2007   Essential hypertension 07/15/2007   PCP:  [name], MD Pharmacy:   [id number] [place] [place]Phone: [phone]: [phone] [facility] [address]Phone: [phone]: [phone]    Social Determinants of Health (SDOH) Social History: SDOH Screenings   Food Insecurity: No Food Insecurity (09/08/2022)  Housing: Low Risk  (09/08/2022)  Transportation Needs: No Transportation Needs (09/08/2022)  Utilities: Not At Risk (09/08/2022)  Alcohol Screen: Low Risk  (11/14/2021)  Depression (PHQ2-9): Low Risk  (09/08/2022)  Financial Resource Strain: Low Risk  (11/14/2021)  Physical Activity: Insufficiently Active (11/14/2021)  Social Connections: Moderately Integrated (11/14/2021)  Stress: No Stress Concern Present (11/14/2021)  Tobacco Use: Low Risk  (09/12/2022)   SDOH Interventions:     Readmission Risk Interventions     No data to display

## 2022-09-15 NOTE — Progress Notes (Signed)
Informed Dr. Bridgett Larsson - Pt is complaining of SOB. Room air her O2 was 90%. I put her on 2L [place]. Her O2 went up to 94%. She is now complaining that she feels like her breathing is getting worse. He ordered labs and CX.

## 2022-09-15 NOTE — Care Management Important Message (Signed)
Important Message  Patient Details  Name: Emily Jennings MRN: [id number] Date of Birth: September 15, 1934   Medicare Important Message Given:  Yes     Tommy Medal 09/15/2022, 1:16 PM

## 2022-09-15 NOTE — Plan of Care (Signed)
  Problem: Education: Goal: Knowledge of General Education information will improve Description Including pain rating scale, medication(s)/side effects and non-pharmacologic comfort measures Outcome: Progressing   

## 2022-09-16 ENCOUNTER — Inpatient Hospital Stay (HOSPITAL_COMMUNITY): Payer: Medicare Other

## 2022-09-16 DIAGNOSIS — I5033 Acute on chronic diastolic (congestive) heart failure: Secondary | ICD-10-CM | POA: Diagnosis not present

## 2022-09-16 LAB — RENAL FUNCTION PANEL
Albumin: 3.3 g/dL — ABNORMAL LOW (ref 3.5–5.0)
Anion gap: 9 (ref 5–15)
BUN: 25 mg/dL — ABNORMAL HIGH (ref 8–23)
CO2: 31 mmol/L (ref 22–32)
Calcium: 8.9 mg/dL (ref 8.9–10.3)
Chloride: 104 mmol/L (ref 98–111)
Creatinine, Ser: 1.53 mg/dL — ABNORMAL HIGH (ref 0.44–1.00)
GFR, Estimated: 33 mL/min — ABNORMAL LOW (ref >60)
Glucose, Bld: 120 mg/dL — ABNORMAL HIGH (ref 70–99)
Phosphorus: 3.2 mg/dL (ref 2.5–4.6)
Potassium: 3.4 mmol/L — ABNORMAL LOW (ref 3.5–5.1)
Sodium: 144 mmol/L (ref 135–145)

## 2022-09-16 LAB — GLUCOSE, CAPILLARY
Glucose-Capillary: 150 mg/dL — ABNORMAL HIGH (ref 70–99)
Glucose-Capillary: 235 mg/dL — ABNORMAL HIGH (ref 70–99)
Glucose-Capillary: 268 mg/dL — ABNORMAL HIGH (ref 70–99)
Glucose-Capillary: 152 mg/dL — ABNORMAL HIGH (ref 70–99)

## 2022-09-16 MED ORDER — POTASSIUM CHLORIDE CRYS ER 20 MEQ PO TBCR
40.0000 meq | EXTENDED_RELEASE_TABLET | ORAL | Status: AC
  Filled 2022-09-16: qty 2

## 2022-09-16 NOTE — Progress Notes (Signed)
PROGRESS NOTE     Emily Jennings, is a 88 y.o. female, DOB - [birth year], UDJ:[id number]  Admit date - 09/13/2022   Admitting Physician Joselin Crandell Denton Brick, MD  Outpatient Primary MD for the patient is [name], MD  LOS - 2  Chief Complaint  Patient presents with   Weakness        Brief Narrative:  88 y.o. female  with medical history significant of hypertension, type 2 diabetes mellitus, mild aortic stenosis, CKD stage IIIb, GERD , chronic diastolic dysfunction CHF admitted on 09/13/2022 with acute on chronic CHF exacerbation    -Assessment and Plan: 1)Acute on chronic diastolic dysfunction CHF--patient presenting with elevated BNP, shortness of breath, increasing lower extremity edema and weight gain... 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 -No significant mitral stenosis noted -Cardiology consult appreciated -PTA patient abruptly stopped and Lasix around 09/10/2022 -Cardiology recommends restarting IV Lasix at 40 mg twice daily,  -Continue Aldactone -Daily weight and fluid input and output charting BNP is trending up 710 >>827 Wt 132 >> 130>>127.7>>122.58 -Fluid balance is inaccurate -Continue IV Lasix at 40 mg every 12 hours =-Consider adding Diamox -Repeat chest x-ray on 09/16/2022 with mild interstitial prominence/pulmonary venous congestion without overt edema -Orthopnea and dyspnea on rest resolving, dyspnea on exertion improving    2)DM2-A1c 7.4 reflecting uncontrolled diabetes with hyperglycemia -Continue Lantus insulin 12 units daily --Use Novolog/Humalog Sliding scale insulin with Accu-Cheks/Fingersticks as ordered    3)HTN-  - -hold olmesartan -c/n Amlodipine and Coreg  may use IV Hydralazine 10 mg  Every 4 hours Prn for systolic blood pressure over 160 mmhg   4)HLD-continue Crestor   5)  CKD stage - 3B -Watch renal function closely especially with aggressive diuresis -Creatinine actually trending down a bit with diuresis - renally adjust medications, avoid nephrotoxic agents / dehydration  / hypotension   6) chronic anemia--probably related to underlying CKD -No bleeding concerns -Hold off on ESA/Procrit  -Negative urine culture, stopped IV Rocephin    Status is: Inpatient   Dispo: The patient is from: Home              Anticipated d/c is to: Home with [facility]               Anticipated d/c date is: 1 days              Patient currently is not medically stable to d/c. Barriers: Not Clinically Stable-   Code Status :  -  Code Status: Full Code   Family Communication:    NA (patient is alert, awake and coherent)   DVT Prophylaxis  :   - SCDs  heparin injection 5,000 Units Start: 09/13/22 1800 Place TED hose Start: 09/13/22 1717 SCDs Start: 09/13/22 1638 Place TED hose Start: 09/13/22 1638   Lab Results  Component Value Date   PLT 210 09/14/2022    Inpatient Medications  Scheduled Meds:  acetaZOLAMIDE  250 mg Intravenous q morning   amLODipine  10 mg Oral Daily   aspirin EC  81 mg Oral Daily   carvedilol  12.5 mg Oral BID WC   cholecalciferol  2,000 Units Oral Daily   furosemide  40 mg Intravenous Q12H   heparin  5,000 Units Subcutaneous Q8H   insulin aspart  0-15 Units Subcutaneous TID WC   insulin aspart  0-5 Units Subcutaneous QHS   insulin glargine-yfgn  12 Units Subcutaneous Daily   potassium chloride SA  20 mEq Oral Daily   rosuvastatin  5 mg Oral Daily   sodium chloride flush  3 mL Intravenous Q12H   sodium chloride flush  3 mL Intravenous Q12H   spironolactone  25 mg Oral Daily   Continuous Infusions:  sodium chloride     PRN Meds:.sodium chloride, acetaminophen **OR**  acetaminophen, bisacodyl, hydrALAZINE, ondansetron **OR** ondansetron (ZOFRAN) IV, polyethylene glycol, sodium chloride flush, traZODone   Anti-infectives (From admission, onward)    Start     Dose/Rate Route Frequency Ordered Stop   09/13/22 1830  cefTRIAXone (ROCEPHIN) 1 g in sodium chloride 0.9 % 100 mL IVPB        1 g 200 mL/hr over 30 Minutes Intravenous Every 24 hours 09/13/22 1732 09/15/22 1950       Subjective: [name] today has no fevers, no emesis,  No chest pain,    -Orthopnea and dyspnea on rest resolving, dyspnea on exertion improving -Voiding really well   Objective: Vitals:   09/16/22 0629 09/16/22 0823 09/16/22 0900 09/16/22 1401  BP: (!) 176/67 (!) 171/77  (!) 145/56  Pulse: 82 85  82  Resp:    20  Temp:    98.7 F (37.1 C)  TempSrc:    Oral  SpO2:    100%  Weight: 54.9 kg  55.6 kg   Height:        Intake/Output Summary (Last 24 hours) at 09/16/2022 1643 Last data filed at 09/16/2022 1402 Gross per 24 hour  Intake 480 ml  Output 700 ml  Net -220 ml   Filed Weights   09/15/22 0628 09/16/22 0629 09/16/22 0900  Weight: 57.9 kg 54.9 kg 55.6 kg    Physical Exam  Physical Examination: General appearance - alert, dyspnea on exertion improving Mental status - alert, oriented to person, place, and time,  Eyes - sclera anicteric Neck - supple,  +ve JVD elevation , Chest -, diminished breath sounds, faint bibasilar Rales  heart - S1 and S2 normal, regular , 2/6 SM Abdomen - soft, nontender, nondistended, +BS Neurological - screening mental status exam normal, neck supple without rigidity, cranial nerves II through XII intact, DTR's normal and symmetric Extremities -improving pitting pedal edema noted, intact peripheral pulses  Skin - warm, dry  Data Reviewed: I have personally reviewed following labs and imaging studies  CBC: Recent Labs  Lab 09/12/22 2209 09/14/22 0434  WBC 6.1 7.6  HGB 10.4* 9.4*  HCT 31.9* 29.1*  MCV 93.3 93.3  PLT 205  425   Basic Metabolic Panel: Recent Labs  Lab 09/12/22 1047 09/12/22 2209 09/14/22 0434 09/15/22 0032 09/16/22 0528  NA 136 130* 140 140  139 144  K 4.0 3.8 3.0* 3.6  3.5 3.4*  CL 94* 92* 100 101  100 104  CO2 '29 29 31 31  30 31  '$ GLUCOSE 210* 229* 192* 219*  219* 120*  BUN 26 30* 37* 31*  31* 25*  CREATININE 1.82* 1.92* 1.85* 1.82*  1.67* 1.53*  CALCIUM 9.4 8.9 8.8* 8.6*  8.6* 8.9  MG  --   --   --  2.0  --   PHOS  --   --  3.0 2.5 3.2   GFR: Estimated Creatinine Clearance: 20.6 mL/min (  A) (by C-G formula based on SCr of 1.53 mg/dL (H)). Liver Function Tests: Recent Labs  Lab 09/14/22 0434 09/15/22 0032 09/16/22 0528  ALBUMIN 3.1* 3.4* 3.3*   Recent Results (from the past 240 hour(s))  Urine Culture     Status: Abnormal   Collection Time: 09/13/22  6:30 AM   Specimen: Urine, Clean Catch  Result Value Ref Range Status   Specimen Description   Final    URINE, CLEAN CATCH Performed at [facility], [address] [place], [place] [address]    Special Requests   Final    NONE Performed at [facility], [address], [place], [place] [address]    Culture (A)  Final    <10,000 COLONIES/mL INSIGNIFICANT GROWTH Performed at [facility], [place] [address], [place], [place] [address]    Report Status 09/14/2022 FINAL  Final  Resp panel by RT-PCR (RSV, Flu A&B, Covid) Anterior Nasal Swab     Status: None   Collection Time: 09/13/22  7:14 AM   Specimen: Anterior Nasal Swab  Result Value Ref Range Status   SARS Coronavirus 2 by RT PCR NEGATIVE NEGATIVE Final    Comment: (NOTE) SARS-CoV-2 target nucleic acids are NOT DETECTED.  The SARS-CoV-2 RNA is generally detectable in upper respiratory specimens during the acute phase of infection. The lowest concentration of SARS-CoV-2 viral copies this assay can detect is 138 copies/mL. A negative result does not preclude SARS-Cov-2 infection and should not be used as the sole basis for treatment or other patient  management decisions. A negative result may occur with  improper specimen collection/handling, submission of specimen other than nasopharyngeal swab, presence of viral mutation(s) within the areas targeted by this assay, and inadequate number of viral copies(<138 copies/mL). A negative result must be combined with clinical observations, patient history, and epidemiological information. The expected result is Negative.  Fact Sheet for Patients:  EntrepreneurPulse.com.au  Fact Sheet for Healthcare Providers:  IncredibleEmployment.be  This test is no t yet approved or cleared by the [place] FDA and  has been authorized for detection and/or diagnosis of SARS-CoV-2 by FDA under an Emergency Use Authorization (EUA). This EUA will remain  in effect (meaning this test can be used) for the duration of the COVID-19 declaration under Section 564(b)(1) of the Act, 21 U.S.C.section 360bbb-3(b)(1), unless the authorization is terminated  or revoked sooner.       Influenza A by PCR NEGATIVE NEGATIVE Final   Influenza B by PCR NEGATIVE NEGATIVE Final    Comment: (NOTE) The Xpert Xpress SARS-CoV-2/FLU/RSV plus assay is intended as an aid in the diagnosis of influenza from Nasopharyngeal swab specimens and should not be used as a sole basis for treatment. Nasal washings and aspirates are unacceptable for Xpert Xpress SARS-CoV-2/FLU/RSV testing.  Fact Sheet for Patients: EntrepreneurPulse.com.au  Fact Sheet for Healthcare Providers: IncredibleEmployment.be  This test is not yet approved or cleared by the [place] FDA and has been authorized for detection and/or diagnosis of SARS-CoV-2 by FDA under an Emergency Use Authorization (EUA). This EUA will remain in effect (meaning this test can be used) for the duration of the COVID-19 declaration under Section 564(b)(1) of the Act, 21 U.S.C. section 360bbb-3(b)(1),  unless the authorization is terminated or revoked.     Resp Syncytial Virus by PCR NEGATIVE NEGATIVE Final    Comment: (NOTE) Fact Sheet for Patients: EntrepreneurPulse.com.au  Fact Sheet for Healthcare Providers: IncredibleEmployment.be  This test is not yet approved or cleared by the [place]  States FDA and has been authorized for detection and/or diagnosis of SARS-CoV-2 by FDA under an Emergency Use Authorization (EUA). This EUA will remain in effect (meaning this test can be used) for the duration of the COVID-19 declaration under Section 564(b)(1) of the Act, 21 U.S.C. section 360bbb-3(b)(1), unless the authorization is terminated or revoked.  Performed at [facility], [address], [place], [place] [address]       Radiology Studies: DG Chest 2 View  Result Date: 09/16/2022 CLINICAL DATA:  Shortness of breath. Respiratory abnormality. Dyspnea. EXAM: CHEST - 2 VIEW COMPARISON:  September 15, 2022 FINDINGS: Stable mild cardiomegaly. The hila and mediastinum are unremarkable. No pneumothorax. No nodules or masses. No suspicious infiltrates. Mild interstitial prominence without overt edema. IMPRESSION: Mild interstitial prominence without overt edema may represent pulmonary venous congestion. No other acute abnormalities. Electronically Signed   By: Dorise Bullion III M.D.   On: 09/16/2022 09:40   DG CHEST PORT 1 VIEW  Result Date: 09/15/2022 CLINICAL DATA:  Dyspnea EXAM: PORTABLE CHEST 1 VIEW COMPARISON:  09/13/2022 FINDINGS: Stable cardiomegaly. Aortic atherosclerotic calcification. Chronic coarsening of the interstitium with peribronchovascular crowding in the lower lungs. No pleural effusion or pneumothorax. IMPRESSION: Stable exam compared to 09/13/2022. Chronic bronchitic change. Cardiomegaly. Electronically Signed   By: Placido Sou M.D.   On: 09/15/2022 00:43     Scheduled Meds:  acetaZOLAMIDE  250 mg Intravenous q morning    amLODipine  10 mg Oral Daily   aspirin EC  81 mg Oral Daily   carvedilol  12.5 mg Oral BID WC   cholecalciferol  2,000 Units Oral Daily   furosemide  40 mg Intravenous Q12H   heparin  5,000 Units Subcutaneous Q8H   insulin aspart  0-15 Units Subcutaneous TID WC   insulin aspart  0-5 Units Subcutaneous QHS   insulin glargine-yfgn  12 Units Subcutaneous Daily   potassium chloride SA  20 mEq Oral Daily   rosuvastatin  5 mg Oral Daily   sodium chloride flush  3 mL Intravenous Q12H   sodium chloride flush  3 mL Intravenous Q12H   spironolactone  25 mg Oral Daily   Continuous Infusions:  sodium chloride       LOS: 2 days    Roxan Hockey M.D on 09/16/2022 at 4:43 PM  Go to [email or web address] - for contact info  [facility] - Office  [phone]  If 7PM-7AM, please contact night-coverage [email or web address] 09/16/2022, 4:43 PM

## 2022-09-17 DIAGNOSIS — I5033 Acute on chronic diastolic (congestive) heart failure: Secondary | ICD-10-CM | POA: Diagnosis not present

## 2022-09-17 LAB — RENAL FUNCTION PANEL
Albumin: 3.2 g/dL — ABNORMAL LOW (ref 3.5–5.0)
Anion gap: 8 (ref 5–15)
BUN: 26 mg/dL — ABNORMAL HIGH (ref 8–23)
CO2: 27 mmol/L (ref 22–32)
Calcium: 9.1 mg/dL (ref 8.9–10.3)
Chloride: 103 mmol/L (ref 98–111)
Creatinine, Ser: 1.44 mg/dL — ABNORMAL HIGH (ref 0.44–1.00)
GFR, Estimated: 35 mL/min — ABNORMAL LOW (ref >60)
Glucose, Bld: 235 mg/dL — ABNORMAL HIGH (ref 70–99)
Phosphorus: 3.7 mg/dL (ref 2.5–4.6)
Potassium: 3.3 mmol/L — ABNORMAL LOW (ref 3.5–5.1)
Sodium: 138 mmol/L (ref 135–145)

## 2022-09-17 LAB — CBC
HCT: 34.2 % — ABNORMAL LOW (ref 36.0–46.0)
Hemoglobin: 10.9 g/dL — ABNORMAL LOW (ref 12.0–15.0)
MCH: 30.6 pg (ref 26.0–34.0)
MCHC: 31.9 g/dL (ref 30.0–36.0)
MCV: 96.1 fL (ref 80.0–100.0)
Platelets: 212 10*3/uL (ref 150–400)
RBC: 3.56 MIL/uL — ABNORMAL LOW (ref 3.87–5.11)
RDW: 12.9 % (ref 11.5–15.5)
WBC: 5.9 10*3/uL (ref 4.0–10.5)
nRBC: 0 % (ref 0.0–0.2)

## 2022-09-17 LAB — GLUCOSE, CAPILLARY
Glucose-Capillary: 293 mg/dL — ABNORMAL HIGH (ref 70–99)
Glucose-Capillary: 223 mg/dL — ABNORMAL HIGH (ref 70–99)

## 2022-09-17 MED ORDER — CARVEDILOL 12.5 MG PO TABS: 18.7500 mg | ORAL_TABLET | Freq: Two times a day (BID) | ORAL | 3 refills | Status: DC

## 2022-09-17 MED ORDER — FUROSEMIDE 40 MG PO TABS: 40.0000 mg | ORAL_TABLET | Freq: Two times a day (BID) | ORAL | 2 refills | Status: DC

## 2022-09-17 MED ORDER — POTASSIUM CHLORIDE CRYS ER 20 MEQ PO TBCR
40.0000 meq | EXTENDED_RELEASE_TABLET | ORAL | Status: AC
  Administered 2022-09-17 (×2): 40 meq via ORAL
  Filled 2022-09-17 (×2): qty 2

## 2022-09-17 MED ORDER — ASPIRIN EC 81 MG PO TBEC: 81.0000 mg | DELAYED_RELEASE_TABLET | Freq: Every day | ORAL | 11 refills | Status: DC

## 2022-09-17 MED ORDER — POTASSIUM CHLORIDE ER 20 MEQ PO TBCR: 20.0000 meq | EXTENDED_RELEASE_TABLET | Freq: Every day | ORAL | 1 refills | Status: DC

## 2022-09-17 MED ORDER — SPIRONOLACTONE 25 MG PO TABS: 25.0000 mg | ORAL_TABLET | Freq: Every day | ORAL | 6 refills | Status: DC

## 2022-09-17 NOTE — Progress Notes (Signed)
Patient has rested well and has voided several times this shift.  Patient is oriented but a forgetful at times. Patient weight this am was 116. 9 lbs on standing scale.  Patient is looking forward to possible discharge this am.

## 2022-09-17 NOTE — Discharge Instructions (Signed)
1)Very low-salt diet advised--less than 2 Gm of Sodium per day 2)Weigh yourself daily, call if you gain more than 3 pounds in 1 day or more than 5 pounds in 1 week as your diuretic medications may need to be adjusted 3)Limit your Fluid  intake to no more than 60 ounces (1.8 Liters) per day 4)Avoid ibuprofen/Advil/Aleve/Motrin/Goody Powders/Naproxen/BC powders/Meloxicam/Diclofenac/Indomethacin and other Nonsteroidal anti-inflammatory medications as these will make you more likely to bleed and can cause stomach ulcers, can also cause Kidney problems.  5)Repeat BMP and CBC Blood Test in 4 to 5 days

## 2022-09-17 NOTE — Discharge Summary (Signed)
Emily Jennings, is a 88 y.o. female  DOB [birth year]  MRN [id number].  Admission date:  09/13/2022  Admitting Physician  Roxan Hockey, MD  Discharge Date:  09/17/2022   Primary MD  [name], MD  Recommendations for primary care physician for things to follow:   1)Very low-salt diet advised--less than 2 Gm of Sodium per day 2)Weigh yourself daily, call if you gain more than 3 pounds in 1 day or more than 5 pounds in 1 week as your diuretic medications may need to be adjusted 3)Limit your Fluid  intake to no more than 60 ounces (1.8 Liters) per day 4)Avoid ibuprofen/Advil/Aleve/Motrin/Goody Powders/Naproxen/BC powders/Meloxicam/Diclofenac/Indomethacin and other Nonsteroidal anti-inflammatory medications as these will make you more likely to bleed and can cause stomach ulcers, can also cause Kidney problems.  5)Repeat BMP and CBC Blood Test in 4 to 5 days  Admission Diagnosis  SOB (shortness of breath) [R06.02] Acute exacerbation of CHF (congestive heart failure) (HCC) [I50.9]   Discharge Diagnosis  SOB (shortness of breath) [R06.02] Acute exacerbation of CHF (congestive heart failure) ([place]) [I50.9]    Principal Problem:   Acute on chronic diastolic (congestive) heart failure (HCC) Active Problems:   Acute exacerbation of CHF (congestive heart failure) (HCC)   Chronic kidney disease (CKD) stage G3b/A2, moderately decreased glomerular filtration rate (GFR) between 30-44 mL/min/1.73 square meter and albuminuria creatinine ratio between 30-299 mg/g (HCC)   Mild aortic valve stenosis   Type 2 diabetes mellitus with stage 3a chronic kidney disease, with long-term current use of insulin ([place])   Essential hypertension   Type 2 diabetes mellitus ([place])   GERD (gastroesophageal reflux disease)      Past Medical History:  Diagnosis Date   Allergy    Anxiety    ANXIETY DISORDER, GENERALIZED  07/15/2007   Qualifier: Diagnosis of  By: Rennie Plowman     Arthritis    Cancer [facility]) 2009   breast, carcinoma in situ left   Carcinoma in situ of breast 05/21/2008   Qualifier: Diagnosis of  By: Moshe Cipro MD, Margaret  Diagnosed in 2009, completed 5 year course of tamoxifen, no evidence of recurrence    Carotid stenosis    11/16/2005  mild plaque formation and stenosis proximal right ECA   Cataract    Complication of anesthesia    Coronary artery disease    cardiac catheterization on 03/20/2006  LAD mid 40% stenosis, left circumflex mild 40% stenosis, RCA mid-vessel 40% to 50% lesion   EF 60%   Diabetes mellitus    GERD (gastroesophageal reflux disease)    Hernia, inguinal    left   Hyperglycemia    Hypertension    Insomnia 11/16/2011   Low blood potassium    Non-insulin dependent type 2 diabetes mellitus ([place])    Osteoporosis    Shortness of breath    2D Echocardiogram 01/26/2009   EF of greater than 55%, mild MR, mild TR, normal ventricular function   Thickened endometrium 10/26/2017   Noted by gyne in 2017, missed 6  month follow up, referred in 09/2017   Ventricular tachycardia, non-sustained ([place])    developed during stress test 02/08/2006, spontaneously aborted, mild reversible apical defect    Past Surgical History:  Procedure Laterality Date   BREAST LUMPECTOMY Left 2009   Left breast 2009   CATARACT EXTRACTION W/PHACO Left 10/28/2014   Procedure: PHACO EMULSION CATARACT EXTRACTION WITH INTRAOCULAR LENS IMPLANT LEFT EYE ([place]);  Surgeon: Marylynn Pearson, MD;  Location: [place];  Service: Ophthalmology;  Laterality: Left;   COLONOSCOPY     cyst removed from left foot     REFRACTIVE SURGERY Left       HPI  from the history and physical done on the day of admission:     Emily Jennings  is a 88 y.o. female  with medical history significant of hypertension, type 2 diabetes mellitus, mild aortic stenosis, CKD stage IIIb, GERD , chronic diastolic dysfunction CHF patient discharged from  inpatient service on 09/06/2022 after treatment for CHF this admission with diuretics -Postdischarge it appears patient was not taking her diuretics, patient was seen as outpatient on 09/07/2022 and advised to take Lasix and Aldactone, she took it for a couple of days but stopped taking it on 09/10/2022 because she felt like she was not voiding enough -She started drinking a lot of water thinking that she needed to drink more water so she can void -Became more short of breath with increasing orthopnea and dyspnea on exertion and increasing leg edema -In the ED chest x-ray without acute cardiopulmonary findings -Patient denies chest pains -UA with possible UTI -Patient is COVID, flu and RSV negative -Troponin is WNL at 9, -BNP is elevated at 710 -EKG requested and pending     Hospital Course:    Brief Narrative:  88 y.o. female  with medical history significant of hypertension, type 2 diabetes mellitus, mild aortic stenosis, CKD stage IIIb, GERD , chronic diastolic dysfunction CHF admitted on 09/13/2022 with acute on chronic CHF exacerbation     -Assessment and Plan: 1)Acute on chronic diastolic dysfunction CHF--patient presenting with elevated BNP, shortness of breath, increasing lower extremity edema and weight gain... 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 -No significant mitral stenosis noted -Cardiology consult appreciated -PTA patient abruptly stopped and Lasix around 09/10/2022 -Cardiology consult appreciated Wt 133 >> 130>>127.7>>122.58>> 116.9 lb -Patient has diuresed very well with IV Lasix and IV Diamox -Weight trending down as noted above -No  further orthopnea no further dyspnea at rest no significant dyspnea on exertion -Post ambulation O2 sats 95 to 97% on room air -we will Discharge home on Lasix 40 mg twice daily along with Aldactone 25 mg daily repeat BMP in 4 to 5 days     2)DM2-A1c 7.4 reflecting uncontrolled diabetes with hyperglycemia -Continue PTA diabetic regimen   3)HTN- - -restart olmesartan -c/n Amlodipine and Coreg    4)HLD-continue Crestor   5)  CKD stage - 3B -Watch renal function closely especially with aggressive diuresis -Creatinine actually trending down  with diuresis Creatinine is 1.44 (down from 1.92 on admission) - renally adjust medications, avoid nephrotoxic agents / dehydration  / hypotension   6) chronic anemia--probably related to underlying CKD -No  bleeding concerns -Repeat CBC as outpatient in 4 to 5 days    Dispo: The patient is from: Home              Anticipated d/c is to: Home with [facility]    Discharge Condition: stable  Follow UP   Follow-up Information     [name], MD Follow up in 1 week(s).   Specialty: Family Medicine Why: Repeat CBC and Repeat BMP in 4 to 5 days Contact information: [address] [place] [address] [phone]                  Consults obtained-cardiology  Diet and Activity recommendation:  As advised  Discharge Instructions    Discharge Instructions     Call MD for:  difficulty breathing, headache or visual disturbances   Complete by: As directed    Call MD for:  persistant dizziness or light-headedness   Complete by: As directed    Call MD for:  persistant nausea and vomiting   Complete by: As directed    Call MD for:  temperature >100.4   Complete by: As directed    Diet - low sodium heart healthy   Complete by: As directed    Discharge instructions   Complete by: As directed    1)Very low-salt diet advised--less than 2 Gm of Sodium per day 2)Weigh yourself daily, call if you gain more than 3 pounds  in 1 day or more than 5 pounds in 1 week as your diuretic medications may need to be adjusted 3)Limit your Fluid  intake to no more than 60 ounces (1.8 Liters) per day 4)Avoid ibuprofen/Advil/Aleve/Motrin/Goody Powders/Naproxen/BC powders/Meloxicam/Diclofenac/Indomethacin and other Nonsteroidal anti-inflammatory medications as these will make you more likely to bleed and can cause stomach ulcers, can also cause Kidney problems.  5)Repeat BMP and CBC Blood Test in 4 to 5 days   Increase activity slowly   Complete by: As directed          Discharge Medications     Allergies as of 09/17/2022       Reactions   Benadryl [diphenhydramine Hcl] Hypertension   Citalopram Other (See Comments)   unknown   Metformin And Related Diarrhea   Lost appetite and weight    Tramadol Other (See Comments)   Felt light headed and dizzy   Crestor [rosuvastatin] Other (See Comments)   "Feet swelling", makes her feel weak        Medication List     STOP taking these medications    mupirocin ointment 2 % Commonly known as: BACTROBAN   potassium chloride SA 20 MEQ tablet Commonly known as: KLOR-CON M       TAKE these medications    acetaminophen 500 MG tablet Commonly known as: TYLENOL Take one tablet two times daily for 3 days, then as needed   alendronate 70 MG tablet Commonly known as: FOSAMAX TAKE 1 TABLET BY MOUTH EVERY 7 DAYS. TAKE WITH A FULL GLASS OF WATER ON AN EMPTY STOMACH What changed: See the new instructions.   amLODipine-olmesartan 10-40 MG tablet Commonly known as: AZOR TAKE 1 TABLET BY MOUTH EVERY DAY   aspirin EC 81 MG tablet Take 1 tablet (81 mg total) by mouth daily with breakfast. What changed: when to take this   blood glucose meter kit and supplies Kit One touch Ultra. Use three times daily as directed. (FOR ICD E11.65)   carvedilol 12.5 MG tablet Commonly known as: COREG Take 1.5  tablets (18.75 mg total) by mouth in the morning and at bedtime.    cholecalciferol 25 MCG (1000 UNIT) tablet Commonly known as: VITAMIN D3 Take 2,000 Units by mouth daily.   furosemide 40 MG tablet Commonly known as: LASIX Take 1 tablet (40 mg total) by mouth 2 (two) times daily. What changed: additional instructions   HumaLOG Mix 75/25 (75-25) 100 UNIT/ML Susp injection Generic drug: insulin lispro protamine-lispro Inject 10-15 Units into the skin See admin instructions. 10 units bid if BG above 90 15 units  if it is over 300   Insulin Pen Needle 32G X 4 MM Misc 1 each by Does not apply route as directed.   Insulin Syringe-Needle U-100 31G X 5/16" 0.5 ML Misc Commonly known as: BD Insulin Syringe U/F USE AS DIRECTED TWICE DAILY   OneTouch Delica Lancets 95J Misc 1 each by Does not apply route 3 (three) times daily. Use to check glucose three times daily   OneTouch Ultra test strip Generic drug: glucose blood USE TO TEST 2 TIMES A DAY   Potassium Chloride ER 20 MEQ Tbcr Take 20 mEq by mouth daily. 1 tab daily by mouth--- take while taking Lasix/furosemide   rosuvastatin 5 MG tablet Commonly known as: CRESTOR TAKE 1 TABLET (5 MG TOTAL) BY MOUTH DAILY.   spironolactone 25 MG tablet Commonly known as: Aldactone Take 1 tablet (25 mg total) by mouth daily.        Major procedures and Radiology Reports - PLEASE review detailed and final reports for all details, in brief -    DG Chest 2 View  Result Date: 09/16/2022 CLINICAL DATA:  Shortness of breath. Respiratory abnormality. Dyspnea. EXAM: CHEST - 2 VIEW COMPARISON:  September 15, 2022 FINDINGS: Stable mild cardiomegaly. The hila and mediastinum are unremarkable. No pneumothorax. No nodules or masses. No suspicious infiltrates. Mild interstitial prominence without overt edema. IMPRESSION: Mild interstitial prominence without overt edema may represent pulmonary venous congestion. No other acute abnormalities. Electronically Signed   By: Dorise Bullion III M.D.   On: 09/16/2022 09:40    DG CHEST PORT 1 VIEW  Result Date: 09/15/2022 CLINICAL DATA:  Dyspnea EXAM: PORTABLE CHEST 1 VIEW COMPARISON:  09/13/2022 FINDINGS: Stable cardiomegaly. Aortic atherosclerotic calcification. Chronic coarsening of the interstitium with peribronchovascular crowding in the lower lungs. No pleural effusion or pneumothorax. IMPRESSION: Stable exam compared to 09/13/2022. Chronic bronchitic change. Cardiomegaly. Electronically Signed   By: Placido Sou M.D.   On: 09/15/2022 00:43   ECHOCARDIOGRAM COMPLETE  Result Date: 09/13/2022    ECHOCARDIOGRAM REPORT   Patient Name:   ZAHRIAH ROES Date of Exam: 09/13/2022 Medical Rec #:  [id number]     Height:       62.0 in Accession #:    [id number]    Weight:       132.3 lb Date of Birth:  [birth year]      BSA:          1.604 m Patient Age:    18 years      BP:           148/71 mmHg Patient Gender: F             HR:           93 bpm. Exam Location:  [facility] Procedure: 2D Echo, Cardiac Doppler and Color Doppler Indications:    Dyspnea  History:        Patient has prior history of Echocardiogram examinations, most  recent 08/01/2022. CHF, CAD, Signs/Symptoms:Syncope, Murmur and                 Edema; Risk Factors:Hypertension, Diabetes and Dyslipidemia.                 Breast CA (resolved).  Sonographer:    Wenda Low Referring Phys: [id number] [place]  1. Left ventricular ejection fraction, by estimation, is 60 to 65%. The left ventricle has normal function. The left ventricle has no regional wall motion abnormalities. There is mild left ventricular hypertrophy. Left ventricular diastolic parameters are consistent with Grade II diastolic dysfunction (pseudonormalization). Elevated left ventricular end-diastolic pressure.  2. Right ventricular systolic function is normal. The right ventricular size is normal. There is moderately elevated pulmonary artery systolic pressure.  3. Right atrial size was mildly dilated.  4. 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 The mitral valve is abnormal. Trivial mitral valve regurgitation. No evidence of mitral stenosis.  5. Higher gradients likely from high CO DVI 0.69 and AVA aas high as 1.95 by VTI suggests not truly moderate AS Acceleration time also fairly normal . The aortic valve is tricuspid. There is moderate calcification of the aortic valve. There is moderate thickening of the aortic valve. Aortic valve regurgitation is not visualized. Mild aortic valve stenosis.  6. The inferior vena cava is dilated in size with >50% respiratory variability, suggesting right atrial pressure of 8 mmHg. FINDINGS  Left Ventricle: Left ventricular ejection fraction, by estimation, is 60 to 65%. The left ventricle has normal function. The left ventricle has no regional wall motion abnormalities. The left ventricular internal cavity size was normal in size. There is  mild left ventricular hypertrophy. Left ventricular diastolic parameters are consistent with Grade II diastolic dysfunction (pseudonormalization). Elevated left ventricular end-diastolic pressure. Right Ventricle: The right ventricular size is normal. No increase in right ventricular wall thickness. Right ventricular systolic function is normal. There is moderately elevated pulmonary artery systolic pressure. The tricuspid regurgitant velocity is 3.52 m/s, and with an assumed right atrial pressure of 8 mmHg, the estimated right ventricular systolic pressure is 25.8 mmHg. Left Atrium: Left atrial size was normal in size. Right Atrium: Right atrial size was mildly dilated. Pericardium: Trivial pericardial effusion is present. The pericardial effusion is posterior to the left ventricle. 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 The mitral valve is abnormal. There is moderate thickening of the mitral valve leaflet(s). Trivial mitral valve regurgitation. No evidence of mitral valve stenosis. MV peak gradient, 12.8 mmHg. The mean mitral valve gradient is 6.0 mmHg. Tricuspid Valve: The tricuspid valve is normal in structure. Tricuspid valve regurgitation is trivial. No evidence of tricuspid stenosis. Aortic Valve: Higher gradients likely from high CO DVI 0.69 and AVA aas high as 1.95 by VTI suggests not truly moderate AS Acceleration time also fairly normal. The aortic valve is tricuspid. There is moderate calcification of the aortic valve. There is moderate thickening of the aortic valve. Aortic valve regurgitation is not visualized. Mild aortic stenosis is present. Aortic valve mean gradient measures 20.7 mmHg. Aortic valve peak gradient measures 41.3 mmHg. Aortic valve area, by VTI measures 1.95 cm. Pulmonic Valve: The pulmonic valve was normal in structure. Pulmonic valve regurgitation is mild. No evidence of pulmonic stenosis.  Aorta: The aortic root is normal in size and structure. Venous: The inferior vena cava is dilated in size with greater than 50% respiratory variability, suggesting right atrial pressure of 8 mmHg. IAS/Shunts: No atrial level shunt detected by color flow Doppler.  LEFT VENTRICLE PLAX 2D LVIDd:         4.30 cm   Diastology LVIDs:         1.70 cm   LV e' medial:    6.85 cm/s LV PW:         1.20 cm   LV E/e' medial:  23.5 LV IVS:        1.20 cm   LV e' lateral:   11.30 cm/s LVOT diam:     1.90 cm   LV E/e' lateral: 14.2 LV SV:         126 LV SV Index:   78 LVOT Area:     2.84 cm  RIGHT VENTRICLE RV Basal diam:  2.90 cm RV Mid diam:    2.00 cm RV S prime:     18.10 cm/s TAPSE (M-mode): 2.7 cm LEFT ATRIUM             Index        RIGHT ATRIUM           Index LA diam:        3.70 cm 2.31 cm/m   RA Area:     17.90 cm LA Vol (A2C):   64.5 ml 40.22 ml/m  RA Volume:   42.30 ml  26.38 ml/m LA Vol (A4C):   60.3 ml  37.60 ml/m LA Biplane Vol: 63.3 ml 39.47 ml/m  AORTIC VALVE                     PULMONIC VALVE AV Area (Vmax):    1.62 cm      PV Vmax:       1.15 m/s AV Area (Vmean):   1.90 cm      PV Peak grad:  5.3 mmHg AV Area (VTI):     1.95 cm AV Vmax:           321.33 cm/s AV Vmean:          208.667 cm/s AV VTI:            0.643 m AV Peak Grad:      41.3 mmHg AV Mean Grad:      20.7 mmHg LVOT Vmax:         184.00 cm/s LVOT Vmean:        140.000 cm/s LVOT VTI:          0.443 m LVOT/AV VTI ratio: 0.69  AORTA Ao Root diam: 3.00 cm MITRAL VALVE                TRICUSPID VALVE MV Area (PHT): 3.99 cm     TR Peak grad:   49.6 mmHg MV Area VTI:   3.68 cm     TR Vmax:        352.00 cm/s MV Peak grad:  12.8 mmHg MV Mean grad:  6.0 mmHg     SHUNTS MV Vmax:       1.79 m/s     Systemic VTI:  0.44 m MV Vmean:      112.0 cm/s   Systemic Diam: 1.90 cm MV Decel Time: 190 msec MV E velocity: 161.00 cm/s MV A velocity: 135.00 cm/s MV E/A ratio:  1.19 Jenkins Rouge MD Electronically  signed by Jenkins Rouge MD Signature Date/Time: 09/13/2022/4:20:14 PM    Final    DG Chest Portable 1 View  Result Date: 09/13/2022 CLINICAL DATA:  88 year old female with shortness of breath. EXAM: PORTABLE CHEST 1 VIEW COMPARISON:  Portable chest 09/06/2022 and earlier. FINDINGS: Portable AP upright view at 0728 hours. Stable lung volumes and cardiomegaly. Calcified aortic atherosclerosis. Other mediastinal contours are within normal limits. Visualized tracheal air column is within normal limits. Chronic left lung base hypo ventilation is stable. Elsewhere when allowing for portable technique the lungs are clear. Hair artifact suspected at the upper chest greater on the left. No acute osseous abnormality identified. Paucity of bowel gas in the visible abdomen. IMPRESSION: No acute cardiopulmonary abnormality. Mild cardiomegaly. Aortic Atherosclerosis (ICD10-I70.0). Electronically Signed   By: Genevie Ann M.D.   On: 09/13/2022 07:48   DG CHEST PORT 1  VIEW  Result Date: 09/06/2022 CLINICAL DATA:  Congestive heart failure in a female at age 65. EXAM: PORTABLE CHEST 1 VIEW COMPARISON:  September 05, 2022 FINDINGS: EKG leads project over the chest. Heart size stable enlarged, at least moderate cardiac enlargement but accentuated by portable technique and AP projection. Interstitial prominence which is similar to previous imaging. No of biller opacities or lobar consolidation. No pneumothorax. On limited assessment no acute skeletal process. IMPRESSION: Cardiomegaly with signs of pulmonary edema. Correlate with evidence of heart failure or volume overload. Electronically Signed   By: Zetta Bills M.D.   On: 09/06/2022 09:46   DG Chest 2 View  Result Date: 09/05/2022 CLINICAL DATA:  Increased shortness of breath, dyspnea on exertion EXAM: CHEST - 2 VIEW COMPARISON:  03/22/2022 FINDINGS: Frontal and lateral views of the chest demonstrate an enlarged cardiac silhouette. There is increased central vascular congestion, with diffuse increased interstitial prominence. No evidence of airspace disease. Trace right pleural effusion. No pneumothorax. No acute bony abnormalities. IMPRESSION: 1. Findings most consistent with congestive heart failure and developing interstitial edema. Electronically Signed   By: Randa Ngo M.D.   On: 09/05/2022 15:19    Micro Results   Recent Results (from the past 240 hour(s))  Urine Culture     Status: Abnormal   Collection Time: 09/13/22  6:30 AM   Specimen: Urine, Clean Catch  Result Value Ref Range Status   Specimen Description   Final    URINE, CLEAN CATCH Performed at [facility], [address] [place], [place] [address]    Special Requests   Final    NONE Performed at [facility], [address] [place], [place] [address]    Culture (A)  Final    <10,000 COLONIES/mL INSIGNIFICANT GROWTH Performed at [facility] [address], [place], [place] [address]    Report Status 09/14/2022 FINAL  Final   Resp panel by RT-PCR (RSV, Flu A&B, Covid) Anterior Nasal Swab     Status: None   Collection Time: 09/13/22  7:14 AM   Specimen: Anterior Nasal Swab  Result Value Ref Range Status   SARS Coronavirus 2 by RT PCR NEGATIVE NEGATIVE Final    Comment: (NOTE) SARS-CoV-2 target nucleic acids are NOT DETECTED.  The SARS-CoV-2 RNA is generally detectable in upper respiratory specimens during the acute phase of infection. The lowest concentration of SARS-CoV-2 viral copies this assay can detect is 138 copies/mL. A negative result does not preclude SARS-Cov-2 infection and should not be used as the sole basis for treatment or other patient management decisions. A negative result may occur with  improper  specimen collection/handling, submission of specimen other than nasopharyngeal swab, presence of viral mutation(s) within the areas targeted by this assay, and inadequate number of viral copies(<138 copies/mL). A negative result must be combined with clinical observations, patient history, and epidemiological information. The expected result is Negative.  Fact Sheet for Patients:  EntrepreneurPulse.com.au  Fact Sheet for Healthcare Providers:  IncredibleEmployment.be  This test is no t yet approved or cleared by the [place] FDA and  has been authorized for detection and/or diagnosis of SARS-CoV-2 by FDA under an Emergency Use Authorization (EUA). This EUA will remain  in effect (meaning this test can be used) for the duration of the COVID-19 declaration under Section 564(b)(1) of the Act, 21 U.S.C.section 360bbb-3(b)(1), unless the authorization is terminated  or revoked sooner.       Influenza A by PCR NEGATIVE NEGATIVE Final   Influenza B by PCR NEGATIVE NEGATIVE Final    Comment: (NOTE) The Xpert Xpress SARS-CoV-2/FLU/RSV plus assay is intended as an aid in the diagnosis of influenza from Nasopharyngeal swab specimens and should not be used  as a sole basis for treatment. Nasal washings and aspirates are unacceptable for Xpert Xpress SARS-CoV-2/FLU/RSV testing.  Fact Sheet for Patients: EntrepreneurPulse.com.au  Fact Sheet for Healthcare Providers: IncredibleEmployment.be  This test is not yet approved or cleared by the [place] FDA and has been authorized for detection and/or diagnosis of SARS-CoV-2 by FDA under an Emergency Use Authorization (EUA). This EUA will remain in effect (meaning this test can be used) for the duration of the COVID-19 declaration under Section 564(b)(1) of the Act, 21 U.S.C. section 360bbb-3(b)(1), unless the authorization is terminated or revoked.     Resp Syncytial Virus by PCR NEGATIVE NEGATIVE Final    Comment: (NOTE) Fact Sheet for Patients: EntrepreneurPulse.com.au  Fact Sheet for Healthcare Providers: IncredibleEmployment.be  This test is not yet approved or cleared by the [place] FDA and has been authorized for detection and/or diagnosis of SARS-CoV-2 by FDA under an Emergency Use Authorization (EUA). This EUA will remain in effect (meaning this test can be used) for the duration of the COVID-19 declaration under Section 564(b)(1) of the Act, 21 U.S.C. section 360bbb-3(b)(1), unless the authorization is terminated or revoked.  Performed at [facility], [address], [place], [place] [address]     Today   Subjective    Sherald Feliz today has no new complaints -Husband at bedside, questions answered -No further orthopnea no further dyspnea at rest no significant dyspnea on exertion -Post ambulation O2 sats 95 to 97% on room air          Patient has been seen and examined prior to discharge   Objective   Blood pressure (!) 165/71, pulse 80, temperature 98.4 F (36.9 C), temperature source Oral, resp. rate 18, height 5' (1.524 m), weight 53 kg, SpO2 94 %.   Intake/Output Summary (Last  24 hours) at 09/17/2022 1104 Last data filed at 09/17/2022 1042 Gross per 24 hour  Intake 720 ml  Output 3600 ml  Net -2880 ml   Exam Gen:- Awake Alert, no acute distress , speaking in complete sentences, no significant dyspnea on exertion HEENT:- [place].AT, No sclera icterus Neck-Supple Neck,No JVD,.  Lungs-improved air movement, no wheezing CV- S1, S2 normal, regular, 2/6 SM Abd-  +ve B.Sounds, Abd Soft, No tenderness,    Extremity/Skin:-Much improved edema,   good pulses Psych-affect is appropriate, oriented x3 Neuro-no new focal deficits, no tremors    Data Review   CBC w  Diff:  Lab Results  Component Value Date   WBC 5.9 09/17/2022   HGB 10.9 (L) 09/17/2022   HGB 11.1 09/05/2022   HCT 34.2 (L) 09/17/2022   HCT 35.0 09/05/2022   PLT 212 09/17/2022   PLT 215 09/05/2022   LYMPHOPCT 17 04/08/2022   MONOPCT 9 04/08/2022   EOSPCT 2 04/08/2022   BASOPCT 1 04/08/2022    CMP:  Lab Results  Component Value Date   NA 138 09/17/2022   NA 136 09/12/2022   K 3.3 (L) 09/17/2022   CL 103 09/17/2022   CO2 27 09/17/2022   BUN 26 (H) 09/17/2022   BUN 26 09/12/2022   CREATININE 1.44 (H) 09/17/2022   CREATININE 1.24 (H) 06/29/2020   PROT 6.1 09/05/2022   ALBUMIN 3.2 (L) 09/17/2022   ALBUMIN 3.8 09/05/2022   BILITOT 0.4 09/05/2022   ALKPHOS 70 09/05/2022   AST 32 09/05/2022   ALT 28 09/05/2022   Total Discharge time is about 33 minutes  Roxan Hockey M.D on 09/17/2022 at 11:04 AM  Go to [email or web address] -  for contact info  [facility] - Office  [phone]

## 2022-09-19 ENCOUNTER — Telehealth: Payer: Self-pay | Admitting: Family Medicine

## 2022-09-19 ENCOUNTER — Telehealth: Payer: Self-pay | Admitting: *Deleted

## 2022-09-19 NOTE — Patient Outreach (Signed)
  Care Coordination TOC Note Transition Care Management Unsuccessful Follow-up Telephone Call  Date of discharge and from where:  [facility] 09/17/22  Attempts:  1st Attempt  Reason for unsuccessful TCM follow-up call:  Left voice message  Chong Sicilian, BSN, RN-BC RN Care Coordinator [place]: [phone] Main #: [phone]

## 2022-09-19 NOTE — Telephone Encounter (Signed)
Patient discharge hospital 12.31.2023 toc scheduled for 01.11.2024. needs a toc call

## 2022-09-20 ENCOUNTER — Telehealth: Payer: Self-pay

## 2022-09-20 ENCOUNTER — Encounter: Payer: Self-pay | Admitting: *Deleted

## 2022-09-20 ENCOUNTER — Telehealth: Payer: Self-pay | Admitting: *Deleted

## 2022-09-20 ENCOUNTER — Telehealth: Payer: Self-pay | Admitting: "Endocrinology

## 2022-09-20 NOTE — Telephone Encounter (Signed)
Spoke with pt, she checked her BG while on the phone with me. BG resulted in 217, discussed BG results with Dr.[name]. Advised pt to take her usual dose of humalog 75/25 at supper per Dr.[name]'s orders. Pt voiced understanding.

## 2022-09-20 NOTE — Telephone Encounter (Signed)
Tried to call pt, had to leave a message requesting pt to call the office back.

## 2022-09-20 NOTE — Progress Notes (Signed)
  Care Coordination   Note   09/20/2022 Name: Emily Jennings MRN: [id number] DOB: 11-04-1933  Emily Jennings is a 89 y.o. year old female who sees Moshe Cipro, [place]wood Levo, MD for primary care. I reached out to [employer] by phone today to offer care coordination services.  Emily Jennings was given information about Care Coordination services today including:   The Care Coordination services include support from the care team which includes your Nurse Coordinator, Clinical Social Worker, or Pharmacist.  The Care Coordination team is here to help remove barriers to the health concerns and goals most important to you. Care Coordination services are voluntary, and the patient may decline or stop services at any time by request to their care team member.   Care Coordination Consent Status: Patient agreed to services and verbal consent obtained.   Follow up plan:  Telephone appointment with care coordination team member scheduled for:  10/04/22  Encounter Outcome:  Pt. Scheduled  [place]  Direct Dial: [phone]

## 2022-09-20 NOTE — Telephone Encounter (Signed)
Pt returned call

## 2022-09-20 NOTE — Patient Outreach (Signed)
  Care Coordination [facility] Note Transition Care Management Unsuccessful Follow-up Telephone Call  Date of discharge and from where:  09/17/22 [facility] dx acute on chronic HF  Attempts:  2nd Attempt  Reason for unsuccessful TCM follow-up call:  Left voice message Peter Garter RN, [facility] [place] Management [phone]

## 2022-09-20 NOTE — Patient Outreach (Addendum)
Care Coordination TOC Note Transition Care Management Follow-up Telephone Call Date of discharge and from where: [facility] 09/17/22 How have you been since you were released from the hospital? "My breathing is better but my blood sugar is running really high. It was 475 before lunch and I took 20 units of 70/30. It was still up at 588 at 2:30 and I took another 15 units." Any questions or concerns? Yes. Blood sugar management. Patient had called Dr Liliane Channel office ealier today regarding elev blood sugar and they returned her call but have to leave a message. She has not talked with them.   Items Reviewed: Did the pt receive and understand the discharge instructions provided? Yes  Medications obtained and verified? Yes  Other? Yes . Reviewed discharge instructions 1)to check and record weight each morning after urinating and to call provider if she gains more than 3 lbs in 24 hours or 5 lbs in one week. Weight is stable at 114 this morning. 116 at discharge.  1)Very low-salt diet advised--less than 2 Gm of Sodium per day 3)Limit your Fluid  intake to no more than 60 ounces (1.8 Liters) per day 4)Avoid ibuprofen/Advil/Aleve/Motrin/Goody Powders/Naproxen/BC Powders/Meloxicam/Diclofenac/Indomethacin and other Nonsteroidal anti-inflammatory medications as these will make you more likely to bleed and can cause stomach ulcers, can also cause Kidney problems. 5)Repeat BMP and CBC Blood Test in 4 to 5 days  Discussed elevated blood sugar and reviewed readings while inpatient. No readings >300 inpatient. Has been running >400 & >500 at home. Taking Humalog 70/30 sliding scale for >300 but it's not dramatically improving. Discussed diet. Nothing out of the ordinary. No increase in simple carbs, sugar, etc. No documented administration of prednisone or other steroids while inpatient.  Any new allergies since your discharge? No  Dietary orders reviewed? Yes Do you have support at home? Yes   Home Care and  Equipment/Supplies: Were home health services ordered? No. Ordered prior to most recent inpatient stay but has not been able to come out yet.  If so, what is the name of the agency? Patient was unsure and unable to locate in chart  Has the agency set up a time to come to the patient's home? They are to reach back out Were any new equipment or medical supplies ordered?  No What is the name of the medical supply agency?  Were you able to get the supplies/equipment? not applicable Do you have any questions related to the use of the equipment or supplies? No  Functional Questionnaire: (I = Independent and D = Dependent) ADLs: I  Bathing/Dressing- I  Meal Prep- I  Eating- I  Maintaining continence- I  Transferring/Ambulation- I  Managing Meds- I  Follow up appointments reviewed:  PCP Hospital f/u appt confirmed? Yes  Scheduled to see Dr Moshe Cipro on 09/28/22 at 9:00 [facility] f/u appt confirmed? Yes  Scheduled to see Dr Arlington Calix (cardio) on 10/02/22 3:00 Are transportation arrangements needed? No  If their condition worsens, is the pt aware to call PCP or go to the Emergency Dept.? Yes Was the patient provided with contact information for the PCP's office or ED? Yes Was to pt encouraged to call back with questions or concerns? Yes  SDOH assessments and interventions completed:   No SDOH Interventions Today    Flowsheet Row Most Recent Value  SDOH Interventions   Housing Interventions Intervention Not Indicated  Transportation Interventions Intervention Not Indicated  Financial Strain Interventions Intervention Not Indicated       Care Coordination  Interventions:  Coordinated care with Dr Liliane Channel office. Talked with nurse, Johnanna Schneiders, via TEAMS regarding blood sugar elevation and transferred call to [facility] and verified that she received the transfer and talked with Ms Riese about her blood sugar management.    Referred for Care Coordination Services: Jackelyn Poling, RN Care  Coordinator  Encounter Outcome:  Pt. Visit Completed    Chong Sicilian, BSN, RN-BC RN Care Coordinator [place] Direct Dial: [phone] Main #: [phone]

## 2022-09-20 NOTE — Telephone Encounter (Deleted)
Error

## 2022-09-20 NOTE — Telephone Encounter (Signed)
Spoke with pt she stated she had checked her BG this morning at breakfast which was 450, she took her humalog 75/25 20 units at that time. States her BG at 2:45p.m. was 588 that is when she took 15 more units of her humalog 75/25. She just has rechecked her BG which is reading 521. Discussed with Dr.[name]. Advised pt not to take any more insulin until she checks her BG at supper time per Dr.[name]. Pt voiced understanding. I will give her a call before the end of the working day to follow up.

## 2022-09-20 NOTE — Telephone Encounter (Addendum)
Pt called and said her BS is 588 right now. I spoke with Dr Dorris Fetch and he wants you to call her. Pt was just recently admitted. There are tons of high BS readings in chart

## 2022-09-21 ENCOUNTER — Telehealth: Payer: Self-pay

## 2022-09-21 NOTE — Telephone Encounter (Signed)
Left a message requesting pt return call to the office. ?

## 2022-09-21 NOTE — Telephone Encounter (Signed)
THN made 2 attempts at [facility] call and couldn't reach pt. Can still bill TOC visit. Scheduled for 1/11

## 2022-09-28 ENCOUNTER — Telehealth: Payer: Self-pay | Admitting: Family Medicine

## 2022-09-28 ENCOUNTER — Ambulatory Visit (INDEPENDENT_AMBULATORY_CARE_PROVIDER_SITE_OTHER): Payer: Medicare Other | Admitting: Family Medicine

## 2022-09-28 ENCOUNTER — Encounter: Payer: Self-pay | Admitting: Family Medicine

## 2022-09-28 VITALS — BP 110/54 | HR 68 | Ht 62.0 in | Wt 117.0 lb

## 2022-09-28 DIAGNOSIS — E782 Mixed hyperlipidemia: Secondary | ICD-10-CM | POA: Diagnosis not present

## 2022-09-28 DIAGNOSIS — R600 Localized edema: Secondary | ICD-10-CM

## 2022-09-28 DIAGNOSIS — Z7689 Persons encountering health services in other specified circumstances: Secondary | ICD-10-CM

## 2022-09-28 DIAGNOSIS — Z794 Long term (current) use of insulin: Secondary | ICD-10-CM | POA: Diagnosis not present

## 2022-09-28 DIAGNOSIS — I10 Essential (primary) hypertension: Secondary | ICD-10-CM | POA: Diagnosis not present

## 2022-09-28 DIAGNOSIS — N1831 Chronic kidney disease, stage 3a: Secondary | ICD-10-CM | POA: Diagnosis not present

## 2022-09-28 DIAGNOSIS — I5033 Acute on chronic diastolic (congestive) heart failure: Secondary | ICD-10-CM

## 2022-09-28 DIAGNOSIS — N184 Chronic kidney disease, stage 4 (severe): Secondary | ICD-10-CM

## 2022-09-28 DIAGNOSIS — E1122 Type 2 diabetes mellitus with diabetic chronic kidney disease: Secondary | ICD-10-CM | POA: Diagnosis not present

## 2022-09-28 LAB — BMP8+EGFR
BUN/Creatinine Ratio: 19 (ref 12–28)
BUN: 40 mg/dL — ABNORMAL HIGH (ref 8–27)
CO2: 26 mmol/L (ref 20–29)
Calcium: 9.5 mg/dL (ref 8.7–10.3)
Chloride: 96 mmol/L (ref 96–106)
Creatinine, Ser: 2.07 mg/dL — ABNORMAL HIGH (ref 0.57–1.00)
Glucose: 155 mg/dL — ABNORMAL HIGH (ref 70–99)
Potassium: 5.1 mmol/L (ref 3.5–5.2)
Sodium: 136 mmol/L (ref 134–144)
eGFR: 23 mL/min/{1.73_m2} — ABNORMAL LOW (ref >59)

## 2022-09-28 NOTE — Telephone Encounter (Signed)
I have called and LMOM regarding her appt Monday 10/02/22 11:20 am regarding the CKD.  Appt in [place] at Dr Marylene Buerger office

## 2022-09-28 NOTE — Patient Instructions (Addendum)
F/U as before, keep annual exam visit, call if you need me sooner  Chem 7 and EGFr stat this morning  We will call this afternoon re labs  Keep Cardiology appt   Thanks for choosing [facility], we consider it a privelige to serve you.

## 2022-10-02 ENCOUNTER — Encounter: Payer: Self-pay | Admitting: Nurse Practitioner

## 2022-10-02 ENCOUNTER — Other Ambulatory Visit (HOSPITAL_COMMUNITY): Payer: Self-pay | Admitting: Nephrology

## 2022-10-02 ENCOUNTER — Ambulatory Visit: Payer: Medicare Other | Attending: Nurse Practitioner | Admitting: Nurse Practitioner

## 2022-10-02 ENCOUNTER — Encounter: Payer: Self-pay | Admitting: Family Medicine

## 2022-10-02 VITALS — BP 136/58 | HR 65 | Ht 61.0 in | Wt 118.2 lb

## 2022-10-02 DIAGNOSIS — I5032 Chronic diastolic (congestive) heart failure: Secondary | ICD-10-CM | POA: Diagnosis not present

## 2022-10-02 DIAGNOSIS — R809 Proteinuria, unspecified: Secondary | ICD-10-CM

## 2022-10-02 DIAGNOSIS — R600 Localized edema: Secondary | ICD-10-CM

## 2022-10-02 DIAGNOSIS — I350 Nonrheumatic aortic (valve) stenosis: Secondary | ICD-10-CM | POA: Diagnosis not present

## 2022-10-02 DIAGNOSIS — D638 Anemia in other chronic diseases classified elsewhere: Secondary | ICD-10-CM | POA: Diagnosis not present

## 2022-10-02 DIAGNOSIS — E1129 Type 2 diabetes mellitus with other diabetic kidney complication: Secondary | ICD-10-CM | POA: Diagnosis not present

## 2022-10-02 DIAGNOSIS — E1122 Type 2 diabetes mellitus with diabetic chronic kidney disease: Secondary | ICD-10-CM | POA: Diagnosis not present

## 2022-10-02 DIAGNOSIS — I129 Hypertensive chronic kidney disease with stage 1 through stage 4 chronic kidney disease, or unspecified chronic kidney disease: Secondary | ICD-10-CM | POA: Diagnosis not present

## 2022-10-02 DIAGNOSIS — N200 Calculus of kidney: Secondary | ICD-10-CM | POA: Diagnosis not present

## 2022-10-02 DIAGNOSIS — I10 Essential (primary) hypertension: Secondary | ICD-10-CM

## 2022-10-02 DIAGNOSIS — N1832 Chronic kidney disease, stage 3b: Secondary | ICD-10-CM | POA: Diagnosis not present

## 2022-10-02 DIAGNOSIS — Z79899 Other long term (current) drug therapy: Secondary | ICD-10-CM

## 2022-10-02 DIAGNOSIS — N189 Chronic kidney disease, unspecified: Secondary | ICD-10-CM | POA: Diagnosis not present

## 2022-10-02 NOTE — Assessment & Plan Note (Signed)
Hyperlipidemia:Low fat diet discussed and encouraged.   Lipid Panel  Lab Results  Component Value Date   CHOL 192 11/24/2021   HDL 118 11/24/2021   LDLCALC 65 11/24/2021   TRIG 43 11/24/2021   CHOLHDL 1.6 11/24/2021     Updated lab needed at/ before next visit.

## 2022-10-02 NOTE — Assessment & Plan Note (Signed)
resolved 

## 2022-10-02 NOTE — Patient Instructions (Addendum)
Medication Instructions:  Continue all current medications.   Labwork: BMET, BNP - orders given today Office will contact with results via phone, letter or mychart.     Testing/Procedures: none  Follow-Up: 6-8 weeks   Any Other Special Instructions Will Be Listed Below (If Applicable). [place] [facility] info sheet given today   If you need a refill on your cardiac medications before your next appointment, please call your pharmacy.

## 2022-10-02 NOTE — Assessment & Plan Note (Signed)
Improved following hospitalization, asymptomatic, 15 pound weight loss , during recent hopsitalization Re educated re need for daily weights and salt and fluid restriction Needs cardiology f/u

## 2022-10-02 NOTE — Assessment & Plan Note (Signed)
Controlled, no change in medication  

## 2022-10-02 NOTE — Progress Notes (Signed)
Cardiology Office Note:    Date:  10/02/2022  ID:  [name], DOB Aug 22, 1934, MRN [id number]  PCP:  Emily Helper, MD   Emily Jennings Cardiologist:  Emily Dolly, MD     Referring MD: Emily Helper, MD   CC: Here for hospital follow-up  History of Present Illness:    Emily Jennings is a 89 y.o. female with a hx of the following:  Cardiovascular disease HFpEF Type 2 diabetes GERD Stage III CKD Hyperlipidemia History of syncope and collapse Leg edema Murmur Dyspnea on exertion Mild AS  Patient is a delightful 89 year old female with past medical history as mentioned above.  She first saw Dr. Carlyle Jennings on July 20, 2022.  She was overall doing well from a cardiac perspective.  Was compliant with her medications.  Had noted some mild DOE, denied any recent swelling.  Blood pressure was elevated in office that day systolic BP was around 774J.  Echocardiogram was arranged for heart murmur found on exam. Aldactone was lowered to 25 mg daily and carvedilol was increased to 18.75 mg twice daily due to history of chronic kidney disease stage IV and to help with mildly elevated BP in office.  Echocardiogram revealed normal EF, no RWMA, moderate LVH, elevated LAP, normal PASP, moderately dilated left atrium, probable ruptured chordae attached to anterior MV leaflet, trivial MR, mildly calcified aortic valve, mild aortic stenosis with mean gradient measuring 14.7 mmHg, no other significant valvular abnormalities.  Was told to follow-up in 6 months.  Most recently, she had recent admission on September 05, 2022 due to worsening shortness of breath, orthopnea.  She had seen her PCP was found to have edema, this was new over the course of 3 weeks, was found to have crackles at the bases so CHF was suspected, PCP sent her to the ED.  She had no other complaints besides exertional shortness of breath and orthopnea.  CXR revealed vascular congestion.  Was  started on IV Lasix.  She diuresed well, weight came back down to baseline.  Symptoms improved.  She was discharged on Lasix 40 mg daily, Aldactone 25 mg daily, and would resume previous home BP medicines.  It was suggested she follow-up with cardiology and her PCP.  I last saw her for follow-up on 09/07/2022. Reported dyspnea walking to 30 feet, there was some medication noncompliance post discharge and was [facility] during office visit. I strongly recommended ED evaluation for presentation of acute on chronic CHF exacerbation, however patient declined. Lasix dosage was increased for a few days and was instructed to follow-up within the next week. Did not follow-up the following week as she was admitted at the time for CHF exacerbation. 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 Treated with IV Lasix and IV Diamox. Transitioned to PO meds. D/C home in stable condition on 09/17/2022.   Today she presents for hospital follow-up. She is doing much better, taking her medications, and tolerating them well. Denies any chest pain, worsening shortness of breath, palpitations, syncope, presyncope, dizziness, orthopnea, PND, acute bleeding, claudication. Weight is stable.  Past Medical History:  Diagnosis Date   Allergy    Anxiety    ANXIETY DISORDER, GENERALIZED 07/15/2007   Qualifier: Diagnosis of  Jennings: Emily Jennings     Arthritis    Cancer [facility])  2009   breast, carcinoma in situ left   Carcinoma in situ of breast 05/21/2008   Qualifier: Diagnosis of  Jennings: Emily Cipro MD, Margaret  Diagnosed in 2009, completed 5 year course of tamoxifen, no evidence of recurrence    Carotid stenosis    11/16/2005  mild plaque formation and stenosis proximal right ECA   Cataract    Complication of anesthesia    Coronary artery disease    cardiac  catheterization on 03/20/2006  LAD mid 40% stenosis, left circumflex mild 40% stenosis, RCA mid-vessel 40% to 50% lesion   EF 60%   Diabetes mellitus    GERD (gastroesophageal reflux disease)    Hernia, inguinal    left   Hyperglycemia    Hypertension    Insomnia 11/16/2011   Low blood potassium    Non-insulin dependent type 2 diabetes mellitus ([place])    Osteoporosis    Shortness of breath    2D Echocardiogram 01/26/2009   EF of greater than 55%, mild MR, mild TR, normal ventricular function   Thickened endometrium 10/26/2017   Noted Jennings gyne in 2017, missed 6 month follow up, referred in 09/2017   Ventricular tachycardia, non-sustained ([place])    developed during stress test 02/08/2006, spontaneously aborted, mild reversible apical defect    Past Surgical History:  Procedure Laterality Date   BREAST LUMPECTOMY Left 2009   Left breast 2009   CATARACT EXTRACTION W/PHACO Left 10/28/2014   Procedure: PHACO EMULSION CATARACT EXTRACTION WITH INTRAOCULAR LENS IMPLANT LEFT EYE ([place]);  Surgeon: Emily Pearson, MD;  Location: [facility];  Service: Ophthalmology;  Laterality: Left;   COLONOSCOPY     cyst removed from left foot     REFRACTIVE SURGERY Left     Current Medications: Current Meds  Medication Sig   amLODipine-olmesartan (AZOR) 10-40 MG tablet TAKE 1 TABLET Jennings MOUTH EVERY DAY   aspirin EC 81 MG tablet Take 1 tablet (81 mg total) Jennings mouth daily with breakfast.   blood glucose meter kit and supplies KIT One touch Ultra. Use three times daily as directed. (FOR ICD E11.65)   carvedilol (COREG) 12.5 MG tablet Take 1.5 tablets (18.75 mg total) Jennings mouth in the morning and at bedtime.   furosemide (LASIX) 40 MG tablet Take 1 tablet (40 mg total) Jennings mouth 2 (two) times daily.   glucose blood (ONETOUCH ULTRA) test strip USE TO TEST 2 TIMES A DAY   insulin lispro protamine-lispro (HUMALOG MIX 75/25) (75-25) 100 UNIT/ML SUSP injection Inject 10-15 Units into the skin See admin instructions. 10 units bid if  BG above 90 15 units  if it is over 300   Insulin Pen Needle 32G X 4 MM MISC 1 each Jennings Does not apply route as directed.   Insulin Syringe-Needle U-100 (BD INSULIN SYRINGE U/F) 31G X 5/16" 0.5 ML MISC USE AS DIRECTED TWICE DAILY   OneTouch Delica Lancets 84X MISC 1 each Jennings Does not apply route 3 (three) times daily. Use to check glucose three times daily   Potassium Chloride ER 20 MEQ TBCR Take 20 mEq Jennings mouth daily. 1 tab daily Jennings mouth--- take while taking Lasix/furosemide   rosuvastatin (CRESTOR) 5 MG tablet TAKE 1 TABLET (5 MG TOTAL) Jennings MOUTH DAILY. (Patient taking differently: Take 5 mg Jennings mouth daily.)   spironolactone (ALDACTONE) 25 MG tablet Take 1 tablet (25 mg total) Jennings mouth daily.      Allergies:   Benadryl [diphenhydramine hcl], Citalopram, Metformin and related, Tramadol, and Crestor [rosuvastatin]  Social History   Socioeconomic History   Marital status: Married    Spouse name: Not on file   Number of children: 2   Years of education: na   Highest education level: Some college, no degree  Occupational History   Occupation: retired    Fish farm manager: retired  Tobacco Use   Smoking status: Never    Passive exposure: Never   Smokeless tobacco: Never  Vaping Use   Vaping Use: Never used  Substance and Sexual Activity   Alcohol use: No   Drug use: No   Sexual activity: Not Currently    Birth control/protection: Post-menopausal  Other Topics Concern   Not on file  Social History Narrative   Lives with her husband and son and attends yoga at the [facility] 2 days a week and speaks to her grandson nightly on the phone   Social Determinants of Health   Financial Resource Strain: Low Risk  (09/20/2022)   Overall Financial Resource Strain (CARDIA)    Difficulty of Paying Living Expenses: Not very hard  Food Insecurity: No Food Insecurity (09/08/2022)   Hunger Vital Sign    Worried About Running Out of Food in the Last Year: Never true    [place] in the Last Year: Never  true  Transportation Needs: No Transportation Needs (09/20/2022)   PRAPARE - Hydrologist (Medical): No    Lack of Transportation (Non-Medical): No  Physical Activity: Insufficiently Active (11/14/2021)   Exercise Vital Sign    Days of Exercise per Week: 7 days    Minutes of Exercise per Session: 20 min  Stress: No Stress Concern Present (11/14/2021)   [place]    Feeling of Stress : Only a little  Social Connections: Moderately Integrated (11/14/2021)   Social Connection and Isolation Panel [NHANES]    Frequency of Communication with Friends and Family: More than three times a week    Frequency of Social Gatherings with Friends and Family: Once a week    Attends Religious Services: More than 4 times per year    Active Member of [employer] or Organizations: No    Attends Music therapist: Never    Marital Status: Married     Family History: The patient's family history includes Arthritis in an other family member; Asthma in an other family member; Cancer in her father; Colon cancer in her father and paternal aunt; Diabetes in an other family member; Heart disease (age of onset: 73) in her brother; Heart disease (age of onset: 25) in her brother; Hyperlipidemia in her mother; Hypertension in her mother; Stroke in her mother; Urticaria in her mother. There is no history of Esophageal cancer, Stomach cancer, or Rectal cancer.  ROS:   Review of Systems  Constitutional: Negative.   HENT: Negative.    Eyes: Negative.   Respiratory: Negative.    Cardiovascular: Negative.   Gastrointestinal: Negative.   Genitourinary: Negative.   Musculoskeletal: Negative.   Skin: Negative.   Neurological: Negative.   Endo/Heme/Allergies: Negative.   Psychiatric/Behavioral:  Positive for memory loss. Negative for depression, hallucinations, substance abuse and suicidal ideas. The patient is not  nervous/anxious and does not have insomnia.    Please see the history of present illness.    All other systems reviewed and are negative.  EKGs/Labs/Other Studies Reviewed:    The following studies were reviewed today:   EKG:  EKG is not ordered today.  Echocardiogram on 09/13/2022: 1. Left ventricular ejection fraction, Jennings estimation, is 60 to 65%. The  left ventricle has normal function. The left ventricle has no regional  wall motion abnormalities. There is mild left ventricular hypertrophy.  Left ventricular diastolic parameters  are consistent with Grade II diastolic dysfunction (pseudonormalization).  Elevated left ventricular end-diastolic pressure.   2. Right ventricular systolic function is normal. The right ventricular  size is normal. There is moderately elevated pulmonary artery systolic  pressure.   3. Right atrial size was mildly dilated.   4. 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 The mitral valve is abnormal. Trivial mitral valve  regurgitation. No evidence of mitral stenosis.   5. Higher gradients likely from high CO DVI 0.69 and AVA aas high as 1.95  Jennings VTI suggests not truly moderate AS Acceleration time also fairly normal  . The aortic valve is tricuspid. There is moderate calcification of the  aortic valve. There is moderate  thickening of the aortic valve. Aortic valve regurgitation is not  visualized. Mild aortic valve stenosis.   6. The inferior vena cava is dilated in size with >50% respiratory  variability, suggesting right atrial pressure of 8 mmHg.  1 view chest x-ray on September 06, 2022: Cardiomegaly with signs of pulmonary edema.  Correlate with evidence of heart failure or volume overload.  2 view CXR on 09/05/2022: Findings most consistent with congestive heart failure and developing interstitial edema.   2D echocardiogram  on August 01, 2022:  1. Left ventricular ejection fraction, Jennings estimation, is 60 to 65%. The  left ventricle has normal function. The left ventricle has no regional  wall motion abnormalities. There is moderate left ventricular hypertrophy.  Left ventricular diastolic  parameters are indeterminate. Elevated left atrial pressure.   2. Right ventricular systolic function is normal. The right ventricular  size is normal. There is normal pulmonary artery systolic pressure.   3. Left atrial size was moderately dilated.   4. Probable ruptured chordae attached to anterior MV leaflet. . The  mitral valve is normal in structure. Trivial mitral valve regurgitation.  No evidence of mitral stenosis.   5. The tricuspid valve is abnormal.   6. The aortic valve is tricuspid. There is mild calcification of the  aortic valve. There is mild thickening of the aortic valve. Aortic valve  regurgitation is not visualized. Mild aortic valve stenosis. Aortic valve  mean gradient measures 14.7 mmHg.  Aortic valve peak gradient measures 28.9 mmHg. Aortic valve area, Jennings VTI  measures 1.64 cm.   7. The inferior vena cava is normal in size with greater than 50%  respiratory variability, suggesting right atrial pressure of 3 mmHg.  Bilateral carotid duplex on Jan 24, 2021: Minor carotid atherosclerosis. No hemodynamically significant ICA stenosis. Degree of narrowing less than 50% bilaterally Jennings ultrasound criteria.   Patent antegrade vertebral flow bilaterally  Recent Labs: 09/05/2022: ALT 28; TSH 0.678 09/15/2022: B Natriuretic Peptide 827.0; Magnesium 2.0 09/17/2022: Hemoglobin 10.9; Platelets 212 09/28/2022: BUN 40; Creatinine, Ser 2.07; Potassium 5.1; Sodium 136  Recent Lipid Panel    Component Value Date/Time   CHOL 192 11/24/2021 0837   TRIG 43 11/24/2021 0837   HDL 118 11/24/2021 0837   CHOLHDL 1.6 11/24/2021 0837   CHOLHDL 1.8 06/29/2020 0916   VLDL 8 10/31/2016 0845   LDLCALC 65 11/24/2021  0837   LDLCALC 80 06/29/2020  0916    Physical Exam:    VS:  BP (!) 136/58   Pulse 65   Ht '5\' 1"'$  (1.549 m)   Wt 118 lb 3.2 oz (53.6 kg)   SpO2 98%   BMI 22.33 kg/m     Wt Readings from Last 3 Encounters:  10/02/22 118 lb 3.2 oz (53.6 kg)  09/28/22 117 lb 0.6 oz (53.1 kg)  09/17/22 116 lb 14.4 oz (53 kg)     GEN: Well nourished, well developed in no acute distress HEENT: Normal NECK: No JVD; No carotid bruits CARDIAC: S1/S2, Grade 2/6 noted, no rubs or gallops noted, 2+ peripheral pulses RESPIRATORY:  Clear and diminshed to auscultation without rales, wheezing or rhonchi MUSCULOSKELETAL:  no edema; No deformity  SKIN: Warm and dry NEUROLOGIC:  Alert and oriented x 3 PSYCHIATRIC:  Normal affect   ASSESSMENT:    1. Chronic congestive heart failure with left ventricular diastolic dysfunction (HCC)   2. Medication management   3. Nonrheumatic aortic valve stenosis   4. Leg edema   5. Essential hypertension   6. Stage 3b chronic kidney disease ([place])    PLAN:    In order of problems listed above:  HFpEF , medication management Recently re-hospitalized for CHF exacerbation. Recent Echo revealed EF normal, mild AS, moderate LVH, normal PASP. Euvolemic and well compensated on exam. Continue AZOR, Carvedilol, Lasix, potassium, and Aldactone. Low sodium diet, fluid restriction <2L, and daily weights encouraged. Educated to contact our office for weight gain of 2 lbs overnight or 5 lbs in one week. Wt log given today. Will obtain proBNP and BMET. If proBNP elevated, plan to increase Lasix if required for a few days, however, does appear euvolemic on exam.   Mild AS Echo 08/2022 revealed moderately calcified aortic valve and mild aortic valve stenosis Grade 2/6 murmur noted on exam. Continue current medication regimen. Plan to update 2D echo in 1 year for monitoring.   Leg edema Improved from last OV. Continue Lasix and potassium. Low salt, heart healthy diet and regular  cardiovascular exercise encouraged. She is wearing compression stockings today.   HTN BP today 136/58. Discussed to monitor BP at home at least 2 hours after medications and sitting for 5-10 minutes. BP log given today and recommended to obtain omron cuff. Discussed to notify office if SBP is consistently > 140. Continue current medication regimen. Low salt, heart healthy diet and regular cardiovascular exercise encouraged.   5. CKD stage 3b Recent sCr was 2.07 with eGFR at 23. Will repeat BMET at this time. Avoid nephrotoxic agents. Continue to follow up with PCP and Nephrology.   5. Disposition: Follow up with me in 6-8 weeks or sooner if anything changes.    Medication Adjustments/Labs and Tests Ordered: Current medicines are reviewed at length with the patient today.  Concerns regarding medicines are outlined above.  Orders Placed This Encounter  Procedures   Basic metabolic panel   Brain natriuretic peptide   No orders of the defined types were placed in this encounter.   Patient Instructions  Medication Instructions:  Continue all current medications.   Labwork: BMET, BNP - orders given today Office will contact with results via phone, letter or mychart.     Testing/Procedures: none  Follow-Up: 6-8 weeks   Any Other Special Instructions Will Be Listed Below (If Applicable). [place] [facility] info sheet given today   If you need a refill on your cardiac medications before your next appointment, please call your pharmacy.  Signed, Finis Bud, NP  10/02/2022 9:28 PM    [facility]

## 2022-10-02 NOTE — Assessment & Plan Note (Signed)
Ms. Tarkowski is reminded of the importance of commitment to daily physical activity for 30 minutes or more, as able and the need to limit carbohydrate intake to 30 to 60 grams per meal to help with blood sugar control.   The need to take medication as prescribed, test blood sugar as directed, and to call between visits if there is a concern that blood sugar is uncontrolled is also discussed.   Ms. Decook is reminded of the importance of daily foot exam, annual eye examination, and good blood sugar, blood pressure and cholesterol control.     Latest Ref Rng & Units 09/28/2022    9:56 AM 09/17/2022    5:21 AM 09/16/2022    5:28 AM 09/15/2022   12:32 AM 09/14/2022    4:34 AM  Diabetic Labs  Creatinine 0.57 - 1.00 mg/dL 2.07  1.44  1.53  1.82    1.67  1.85       10/02/2022    2:56 PM 09/28/2022    9:43 AM 09/28/2022    9:42 AM 09/28/2022    9:17 AM 09/17/2022    1:41 PM 09/17/2022    8:41 AM 09/17/2022    4:17 AM  BP/Weight  Systolic BP 944 967 591 [phone] 357  Diastolic BP 58 54 56 54 60 71 68  Wt. (Lbs) 118.2   117.04     BMI 22.33 kg/m2   21.41 kg/m2         Latest Ref Rng & Units 11/10/2020    1:18 PM 11/10/2020   12:00 AM  Foot/eye exam completion dates  Eye Exam No Retinopathy No Retinopathy     No Retinopathy         This result is from an external source.      Uncontrolled, needs to f/u with Endo

## 2022-10-02 NOTE — Assessment & Plan Note (Signed)
Patient in for follow up of recent hospitalization. Discharge summary, and laboratory and radiology data are reviewed, and any questions or concerns  are discussed. Specific issues requiring follow up are specifically addressed.  

## 2022-10-02 NOTE — Progress Notes (Signed)
Emily Jennings     MRN: [id number]      DOB: 1933-10-27   HPI Emily Jennings is here for follow up of hospiTALIZATION FROM 12/27 TO 09/17/2022 due to acute exaccerbation of CHF, acute on chronic diastolic heart failure, CKD Not taking medication as on discharge instructions Feels better than pre admission ROS Denies recent fever or chills. Denies sinus pressure, nasal congestion, ear pain or sore throat. Denies chest congestion, productive cough or wheezing. Denies chest pains, palpitations and reports marked improvement in  leg swelling Denies abdominal pain, nausea, vomiting,diarrhea or constipation.   Denies dysuria, frequency, hesitancy or incontinence. Denies uncontrolled  joint pain, swelling and limitation in mobility. Denies headaches, seizures, numbness, or tingling. Denies depression, anxiety or insomnia. Denies skin break down or rash.   PE  BP (!) 110/54   Pulse 68   Ht '5\' 2"'$  (1.575 m)   Wt 117 lb 0.6 oz (53.1 kg)   SpO2 96%   BMI 21.41 kg/m   Patient alert and oriented and in no cardiopulmonary distress.  HEENT: No facial asymmetry, EOMI,     Neck supple .  Chest: Clear to auscultation bilaterally.  CVS: S1, S2 systolic murmur, no S3.Regular rate.  ABD: Soft non tender.   Ext: No edema  MS: Adequate ROM spine, shoulders, hips and knees.  Skin: Intact, no ulcerations or rash noted.  Psych: Good eye contact, normal affect. Memory intact not anxious or depressed appearing.  CNS: CN 2-12 intact, power,  normal throughout.no focal deficits noted.   Assessment & Plan  Acute on chronic diastolic (congestive) heart failure (HCC) Improved following hospitalization, asymptomatic, 15 pound weight loss , during recent hopsitalization Re educated re need for daily weights and salt and fluid restriction Needs cardiology f/u   Essential hypertension Controlled, no change in medication   Type 2 diabetes mellitus with stage 3a chronic kidney disease, with long-term  current use of insulin [facility]) Emily Jennings is reminded of the importance of commitment to daily physical activity for 30 minutes or more, as able and the need to limit carbohydrate intake to 30 to 60 grams per meal to help with blood sugar control.   The need to take medication as prescribed, test blood sugar as directed, and to call between visits if there is a concern that blood sugar is uncontrolled is also discussed.   Emily Jennings is reminded of the importance of daily foot exam, annual eye examination, and good blood sugar, blood pressure and cholesterol control.     Latest Ref Rng & Units 09/28/2022    9:56 AM 09/17/2022    5:21 AM 09/16/2022    5:28 AM 09/15/2022   12:32 AM 09/14/2022    4:34 AM  Diabetic Labs  Creatinine 0.57 - 1.00 mg/dL 2.07  1.44  1.53  1.82    1.67  1.85       10/02/2022    2:56 PM 09/28/2022    9:43 AM 09/28/2022    9:42 AM 09/28/2022    9:17 AM 09/17/2022    1:41 PM 09/17/2022    8:41 AM 09/17/2022    4:17 AM  BP/Weight  Systolic BP 767 209 470 [phone]  Diastolic BP 58 54 56 54 60 71 68  Wt. (Lbs) 118.2   117.04     BMI 22.33 kg/m2   21.41 kg/m2         Latest Ref Rng & Units 11/10/2020    1:18 PM 11/10/2020  12:00 AM  Foot/eye exam completion dates  Eye Exam No Retinopathy No Retinopathy     No Retinopathy         This result is from an external source.      Uncontrolled, needs to f/u with Endo  Mixed hyperlipidemia Hyperlipidemia:Low fat diet discussed and encouraged.   Lipid Panel  Lab Results  Component Value Date   CHOL 192 11/24/2021   HDL 118 11/24/2021   LDLCALC 65 11/24/2021   TRIG 43 11/24/2021   CHOLHDL 1.6 11/24/2021     Updated lab needed at/ before next visit.   Bilateral lower extremity edema resolved  Encounter for support and coordination of transition of care Patient in for follow up of recent hospitalization. Discharge summary, and laboratory and radiology data are reviewed, and any questions or concerns   are discussed. Specific issues requiring follow up are specifically addressed.

## 2022-10-03 ENCOUNTER — Other Ambulatory Visit: Payer: Self-pay

## 2022-10-03 ENCOUNTER — Encounter: Payer: Medicare Other | Admitting: *Deleted

## 2022-10-03 MED ORDER — ONETOUCH DELICA LANCETS 33G MISC: 1.0000 | Freq: Three times a day (TID) | 2 refills | Status: DC

## 2022-10-03 MED ORDER — PEN NEEDLES 31G X 8 MM MISC: 1.0000 | Freq: Two times a day (BID) | 2 refills | Status: DC

## 2022-10-04 ENCOUNTER — Encounter: Payer: Self-pay | Admitting: *Deleted

## 2022-10-04 ENCOUNTER — Ambulatory Visit: Payer: Self-pay | Admitting: *Deleted

## 2022-10-05 ENCOUNTER — Telehealth: Payer: Self-pay | Admitting: "Endocrinology

## 2022-10-05 DIAGNOSIS — Z794 Long term (current) use of insulin: Secondary | ICD-10-CM

## 2022-10-05 MED ORDER — "INSULIN SYRINGE-NEEDLE U-100 31G X 5/16"" 0.5 ML MISC": 2 refills | Status: DC

## 2022-10-05 NOTE — Telephone Encounter (Signed)
Rx refill sent  

## 2022-10-05 NOTE — Telephone Encounter (Signed)
New message     1. Which medications need to be refilled? (please list name of each medication and dose if known) Insulin Syringe-Needle U-100 (BD INSULIN SYRINGE U/F) 31G X 5/16" 0.5 ML MISC   2. Which pharmacy/location (including street and city if local pharmacy) is medication to be sent to? [facility] in [place]    3. Do they need a 30 day or 90 day supply? 90 day supply

## 2022-10-09 ENCOUNTER — Other Ambulatory Visit: Payer: Self-pay

## 2022-10-09 DIAGNOSIS — R0609 Other forms of dyspnea: Secondary | ICD-10-CM

## 2022-10-09 MED ORDER — FUROSEMIDE 40 MG PO TABS: 40.0000 mg | ORAL_TABLET | Freq: Two times a day (BID) | ORAL | 2 refills | Status: DC

## 2022-10-09 NOTE — Telephone Encounter (Signed)
Refilled lasix 40 mg bid to [facility]

## 2022-10-10 ENCOUNTER — Encounter: Payer: Self-pay | Admitting: Family Medicine

## 2022-10-10 ENCOUNTER — Ambulatory Visit (INDEPENDENT_AMBULATORY_CARE_PROVIDER_SITE_OTHER): Payer: Medicare Other | Admitting: Family Medicine

## 2022-10-10 ENCOUNTER — Ambulatory Visit: Payer: Self-pay | Admitting: *Deleted

## 2022-10-10 VITALS — BP 138/60 | HR 70 | Ht 61.0 in | Wt 122.0 lb

## 2022-10-10 DIAGNOSIS — I10 Essential (primary) hypertension: Secondary | ICD-10-CM

## 2022-10-10 DIAGNOSIS — N184 Chronic kidney disease, stage 4 (severe): Secondary | ICD-10-CM | POA: Diagnosis not present

## 2022-10-10 DIAGNOSIS — Z0001 Encounter for general adult medical examination with abnormal findings: Secondary | ICD-10-CM

## 2022-10-10 DIAGNOSIS — E559 Vitamin D deficiency, unspecified: Secondary | ICD-10-CM | POA: Diagnosis not present

## 2022-10-10 DIAGNOSIS — N1831 Chronic kidney disease, stage 3a: Secondary | ICD-10-CM

## 2022-10-10 DIAGNOSIS — E782 Mixed hyperlipidemia: Secondary | ICD-10-CM

## 2022-10-10 DIAGNOSIS — E1122 Type 2 diabetes mellitus with diabetic chronic kidney disease: Secondary | ICD-10-CM | POA: Diagnosis not present

## 2022-10-10 DIAGNOSIS — Z1239 Encounter for other screening for malignant neoplasm of breast: Secondary | ICD-10-CM

## 2022-10-10 DIAGNOSIS — E785 Hyperlipidemia, unspecified: Secondary | ICD-10-CM | POA: Diagnosis not present

## 2022-10-10 DIAGNOSIS — E118 Type 2 diabetes mellitus with unspecified complications: Secondary | ICD-10-CM

## 2022-10-10 DIAGNOSIS — Z780 Asymptomatic menopausal state: Secondary | ICD-10-CM

## 2022-10-10 DIAGNOSIS — Z794 Long term (current) use of insulin: Secondary | ICD-10-CM

## 2022-10-10 NOTE — Patient Instructions (Addendum)
F/u in 4 months, call if you need me sooner, foot exam at visit  You are referred for bone density test.  Please schedule mammogram at checkout.  Fasting lipid hepatic panel and vitamin D level 3 to 5 days before next visit.  You need the COVID-vaccine and also the RSV vaccine.  Both are at your pharmacy.  Plan to get them 2 weeks apart.  Please schedule your eye exam which is past due.  You have been referred.  Thanks for choosing [facility], we consider it a privelige to serve you.

## 2022-10-10 NOTE — Patient Outreach (Signed)
  Care Coordination   10/10/2022 Name: Emily Jennings MRN: [id number] DOB: 1934/01/17   Care Coordination Outreach Attempts:  An unsuccessful telephone outreach was attempted for a scheduled appointment today.  Follow Up Plan:  Additional outreach attempts will be made to offer the patient care coordination information and services.   Encounter Outcome:  No Answer   Care Coordination Interventions:  Yes, provided    Collaborated with Finis Bud, NP (cardiology) via staff message regarding lasix and edema.   "Thanks for reaching out. When I asked her in office, she said her weight was stable. She should have a weight log and should be writing this down every day. I wanted to see what her labs showed and can base dose also off that with repeat BMET and BNP. I'm afraid if I scale back on Lasix she could end back in ED for fluid retention. She said her scale and our scale are different. I'm more concerned about her retaining fluid too. Please pass on to her that she needs to write down her weight every morning before she eats or drinks anything. If she gains more than 3 lbs in one day or more than 5 lbs in one week, she needs to let our office know. If she continues to lose significant amount of weight over the next week on her home scale, she needs to let our office know. I first want to see what her lab work shows first."  Chong Sicilian, BSN, RN-BC [place]: [phone] Main #: [phone]

## 2022-10-11 ENCOUNTER — Ambulatory Visit (HOSPITAL_COMMUNITY)
Admission: RE | Admit: 2022-10-11 | Discharge: 2022-10-11 | Disposition: A | Discharge status: Home / Self Care | Payer: Medicare Other | Source: Ambulatory Visit | Attending: Nephrology | Admitting: Nephrology

## 2022-10-11 ENCOUNTER — Ambulatory Visit: Payer: Self-pay | Admitting: *Deleted

## 2022-10-11 ENCOUNTER — Telehealth: Payer: Self-pay | Admitting: *Deleted

## 2022-10-11 DIAGNOSIS — I10 Essential (primary) hypertension: Secondary | ICD-10-CM | POA: Insufficient documentation

## 2022-10-11 DIAGNOSIS — N189 Chronic kidney disease, unspecified: Secondary | ICD-10-CM | POA: Diagnosis not present

## 2022-10-11 DIAGNOSIS — E1122 Type 2 diabetes mellitus with diabetic chronic kidney disease: Secondary | ICD-10-CM | POA: Diagnosis not present

## 2022-10-11 DIAGNOSIS — I5032 Chronic diastolic (congestive) heart failure: Secondary | ICD-10-CM | POA: Diagnosis not present

## 2022-10-11 DIAGNOSIS — N181 Chronic kidney disease, stage 1: Secondary | ICD-10-CM | POA: Diagnosis not present

## 2022-10-11 DIAGNOSIS — D638 Anemia in other chronic diseases classified elsewhere: Secondary | ICD-10-CM | POA: Insufficient documentation

## 2022-10-11 DIAGNOSIS — R809 Proteinuria, unspecified: Secondary | ICD-10-CM | POA: Diagnosis not present

## 2022-10-11 DIAGNOSIS — N200 Calculus of kidney: Secondary | ICD-10-CM | POA: Diagnosis not present

## 2022-10-11 DIAGNOSIS — R188 Other ascites: Secondary | ICD-10-CM | POA: Diagnosis not present

## 2022-10-11 NOTE — Progress Notes (Signed)
  Care Coordination Note  10/11/2022 Name: Emily Jennings MRN: [id number] DOB: 06/11/1934  [name] is a 89 y.o. year old female who is a primary care patient of [name], MD and is actively engaged with the care management team. I reached out to [name] by phone today to assist with re-scheduling a follow up visit with the RN Case Manager  Follow up plan: Unsuccessful telephone outreach attempt made. A HIPAA compliant phone message was left for the patient providing contact information and requesting a return call.   [place]  Direct Dial: [phone]

## 2022-10-11 NOTE — Patient Outreach (Signed)
  Care Coordination   10/11/2022 Name: Emily Jennings MRN: [id number] DOB: 1934/08/18   Care Coordination Outreach Attempts:  An unsuccessful telephone outreach was attempted for a scheduled appointment today.  Follow Up Plan:  Additional outreach attempts will be made to offer the patient care coordination information and services.   Encounter Outcome:  No Answer   Care Coordination Interventions:  No, not indicated    Reviewed office visit with PCP yesterday. VS stable. Would like to talk with patient about diuretic and pass on recommendation from cardiology dept. See telephone encounter from 10/10/22 for additional information.   Chong Sicilian, BSN, RN-BC RN Care Coordinator [place] Direct Dial: [phone] Main #: [phone]

## 2022-10-16 DIAGNOSIS — Z0001 Encounter for general adult medical examination with abnormal findings: Secondary | ICD-10-CM | POA: Insufficient documentation

## 2022-10-16 DIAGNOSIS — E559 Vitamin D deficiency, unspecified: Secondary | ICD-10-CM | POA: Insufficient documentation

## 2022-10-16 NOTE — Progress Notes (Signed)
  Care Coordination Note  10/16/2022 Name: Emily Jennings MRN: [id number] DOB: Dec 27, 1933  Emily Jennings is a 89 y.o. year old female who is a primary care patient of [name], MD and is actively engaged with the care management team. I reached out to [name] by phone today to assist with re-scheduling a follow up visit with the RN Case Manager  Follow up plan: Unsuccessful telephone outreach attempt made. A HIPAA compliant phone message was left for the patient providing contact information and requesting a return call.  We have been unable to make contact with the patient for follow up. The care management team is available to follow up with the patient after provider conversation with the patient regarding recommendation for care management engagement and subsequent re-referral to the care management team.   [place]  Direct Dial: [phone]

## 2022-10-16 NOTE — Assessment & Plan Note (Signed)
Controlled, no change in medication DASH diet and commitment to daily physical activity for a minimum of 30 minutes discussed and encouraged, as a part of hypertension management. The importance of attaining a healthy weight is also discussed.     10/10/2022    1:22 PM 10/10/2022    1:19 PM 10/02/2022    2:56 PM 09/28/2022    9:43 AM 09/28/2022    9:42 AM 09/28/2022    9:17 AM 09/17/2022    1:41 PM  BP/Weight  Systolic BP 250 539 767 341 [phone]  Diastolic BP 60 66 58 54 56 54 60  Wt. (Lbs)  122.04 118.2   117.04   BMI  23.06 kg/m2 22.33 kg/m2   21.41 kg/m2

## 2022-10-16 NOTE — Assessment & Plan Note (Signed)

## 2022-10-16 NOTE — Assessment & Plan Note (Signed)
Updated lab needed at/ before next visit.   

## 2022-10-16 NOTE — Assessment & Plan Note (Signed)
Hyperlipidemia:Low fat diet discussed and encouraged.   Lipid Panel  Lab Results  Component Value Date   CHOL 192 11/24/2021   HDL 118 11/24/2021   LDLCALC 65 11/24/2021   TRIG 43 11/24/2021   CHOLHDL 1.6 11/24/2021     Updated lab needed at/ before next visit.

## 2022-10-16 NOTE — Progress Notes (Signed)
Emily Jennings     MRN: [id number]      DOB: 16-Jun-1934  HPI: Patient is in for annual physical exam. No other health concerns are expressed or addressed at the visit. Recent labs,  are reviewed. Immunization is reviewed , and  updated if needed.   PE: BP 138/60 (BP Location: Right Arm, Patient Position: Sitting, Cuff Size: Normal)   Pulse 70   Ht '5\' 1"'$  (1.549 m)   Wt 122 lb 0.6 oz (55.4 kg)   SpO2 94%   BMI 23.06 kg/m   Pleasant  female, alert and oriented x 3, in no cardio-pulmonary distress. Afebrile. HEENT No facial trauma or asymetry. Sinuses non tender.  Extra occullar muscles intact.. External ears normal, . Neck: supple, no adenopathy,JVD or thyromegaly.No bruits.  Chest: Clear to ascultation bilaterally.No crackles or wheezes. Non tender to palpation    Cardiovascular system; Heart sounds normal,  S1 and  S2 ,no S3.  Systolic  murmur,no  thrill.  Peripheral pulses normal.  Abdomen: Soft, non tender,al. No guarding, tenderness or rebound.    Musculoskeletal exam: Full ROM of spine, hips , shoulders and knees. No deformity ,swelling or crepitus noted. No muscle wasting or atrophy.   Neurologic: Cranial nerves 2 to 12 intact. Power, tone ,sensation normal throughout. No disturbance in gait. No tremor.  Skin: Intact, no ulceration, erythema , scaling or rash noted. Pigmentation normal throughout  Psych; Normal mood and affect. Judgement and concentration normal   Assessment & Plan:  Encounter for Medicare annual examination with abnormal findings Annual exam as documented. Counseling done  re healthy lifestyle involving commitment to 150 minutes exercise per week, heart healthy diet, and attaining healthy weight.The importance of adequate sleep also discussed. Regular seat belt use and home safety, is also discussed. Changes in health habits are decided on by the patient with goals and time frames  set for achieving them. Immunization and  cancer screening needs are specifically addressed at this visit.   Type 2 diabetes mellitus with stage 3a chronic kidney disease, with long-term current use of insulin [facility]) [name] is reminded of the importance of commitment to daily physical activity for 30 minutes or more, as able and the need to limit carbohydrate intake to 30 to 60 grams per meal to help with blood sugar control.  Managed by endo  The need to take medication as prescribed, test blood sugar as directed, and to call between visits if there is a concern that blood sugar is uncontrolled is also discussed.   [name] is reminded of the importance of daily foot exam, annual eye examination, and good blood sugar, blood pressure and cholesterol control.     Latest Ref Rng & Units 09/28/2022    9:56 AM 09/17/2022    5:21 AM 09/16/2022    5:28 AM 09/15/2022   12:32 AM 09/14/2022    4:34 AM  Diabetic Labs  Creatinine 0.57 - 1.00 mg/dL 2.07  1.44  1.53  1.82    1.67  1.85       10/10/2022    1:22 PM 10/10/2022    1:19 PM 10/02/2022    2:56 PM 09/28/2022    9:43 AM 09/28/2022    9:42 AM 09/28/2022    9:17 AM 09/17/2022    1:41 PM  BP/Weight  Systolic BP 882 800 349 179 150 569 794  Diastolic BP 60 66 58 54 56 54 60  Wt. (Lbs)  122.04 118.2   117.04  BMI  23.06 kg/m2 22.33 kg/m2   21.41 kg/m2       Latest Ref Rng & Units 11/10/2020    1:18 PM 11/10/2020   12:00 AM  Foot/eye exam completion dates  Eye Exam No Retinopathy No Retinopathy     No Retinopathy         This result is from an external source.        Essential hypertension Controlled, no change in medication DASH diet and commitment to daily physical activity for a minimum of 30 minutes discussed and encouraged, as a part of hypertension management. The importance of attaining a healthy weight is also discussed.     10/10/2022    1:22 PM 10/10/2022    1:19 PM 10/02/2022    2:56 PM 09/28/2022    9:43 AM 09/28/2022    9:42 AM 09/28/2022    9:17 AM  09/17/2022    1:41 PM  BP/Weight  Systolic BP 009 233 007 622 633 354 562  Diastolic BP 60 66 58 54 56 54 60  Wt. (Lbs)  122.04 118.2   117.04   BMI  23.06 kg/m2 22.33 kg/m2   21.41 kg/m2        Hyperlipidemia LDL goal <100 Hyperlipidemia:Low fat diet discussed and encouraged.   Lipid Panel  Lab Results  Component Value Date   CHOL 192 11/24/2021   HDL 118 11/24/2021   LDLCALC 65 11/24/2021   TRIG 43 11/24/2021   CHOLHDL 1.6 11/24/2021     Updated lab needed at/ before next visit.   Vitamin D deficiency Updated lab needed at/ before next visit.

## 2022-10-16 NOTE — Assessment & Plan Note (Addendum)
Ms. Engert is reminded of the importance of commitment to daily physical activity for 30 minutes or more, as able and the need to limit carbohydrate intake to 30 to 60 grams per meal to help with blood sugar control.  Managed by endo  The need to take medication as prescribed, test blood sugar as directed, and to call between visits if there is a concern that blood sugar is uncontrolled is also discussed.   Ms. Bach is reminded of the importance of daily foot exam, annual eye examination, and good blood sugar, blood pressure and cholesterol control.     Latest Ref Rng & Units 09/28/2022    9:56 AM 09/17/2022    5:21 AM 09/16/2022    5:28 AM 09/15/2022   12:32 AM 09/14/2022    4:34 AM  Diabetic Labs  Creatinine 0.57 - 1.00 mg/dL 2.07  1.44  1.53  1.82    1.67  1.85       10/10/2022    1:22 PM 10/10/2022    1:19 PM 10/02/2022    2:56 PM 09/28/2022    9:43 AM 09/28/2022    9:42 AM 09/28/2022    9:17 AM 09/17/2022    1:41 PM  BP/Weight  Systolic BP 836 629 476 [phone] 568  Diastolic BP 60 66 58 54 56 54 60  Wt. (Lbs)  122.04 118.2   117.04   BMI  23.06 kg/m2 22.33 kg/m2   21.41 kg/m2       Latest Ref Rng & Units 11/10/2020    1:18 PM 11/10/2020   12:00 AM  Foot/eye exam completion dates  Eye Exam No Retinopathy No Retinopathy     No Retinopathy         This result is from an external source.

## 2022-10-17 ENCOUNTER — Telehealth: Payer: Self-pay | Admitting: *Deleted

## 2022-10-17 NOTE — Progress Notes (Signed)
  Care Coordination Note  10/17/2022 Name: MABREY HOWLAND MRN: [id number] DOB: [birth year]  Sherron Monday Foglesong is a 89 y.o. year old female who is a primary care patient of [name], MD and is actively engaged with the care management team. I reached out to [name] by phone today to assist with re-scheduling a follow up visit with the RN Case Manager  Follow up plan: Telephone appointment with care management team member scheduled for:10/23/22  [place]: [phone]

## 2022-10-20 ENCOUNTER — Telehealth: Payer: Self-pay | Admitting: Cardiology

## 2022-10-20 NOTE — Telephone Encounter (Signed)
Reports voiding at least 3 times today. Denies odor, burning or pain with voiding. Denies swelling, SOB, dizziness or chest pain. Advised to contact her PCP or nephrologist with this concern. Verbalized understanding of plan.

## 2022-10-20 NOTE — Telephone Encounter (Signed)
Pt c/o medication issue:  1. Name of Medication:   furosemide (LASIX) 40 MG tablet    2. How are you currently taking this medication (dosage and times per day)?    Take 1 tablet (40 mg total) by mouth 2 (two) times daily.    3. Are you having a reaction (difficulty breathing--STAT)? No  4. What is your medication issue? Pt would like a callback regarding medication not working like it should. Pt states that she has drank 5 glasses of water today and has yet to go to the bathroom. Please advise

## 2022-10-23 ENCOUNTER — Ambulatory Visit: Payer: Self-pay | Admitting: *Deleted

## 2022-10-23 NOTE — Patient Outreach (Signed)
  Care Coordination   10/23/2022 Name: Emily Jennings MRN: [id number] DOB: October 30, 1933   Care Coordination Outreach Attempts:  An unsuccessful telephone outreach was attempted for a scheduled appointment today.  Follow Up Plan:  Additional outreach attempts will be made to offer the patient care coordination information and services.   Encounter Outcome:  No Answer. Left message requesting return call.   Care Coordination Interventions:  No, not indicated    Chong Sicilian, BSN, RN-BC RN Care Coordinator [place] Direct Dial: [phone] Main #: [phone]

## 2022-10-24 DIAGNOSIS — E1122 Type 2 diabetes mellitus with diabetic chronic kidney disease: Secondary | ICD-10-CM | POA: Diagnosis not present

## 2022-10-24 DIAGNOSIS — I5032 Chronic diastolic (congestive) heart failure: Secondary | ICD-10-CM | POA: Diagnosis not present

## 2022-10-24 DIAGNOSIS — I129 Hypertensive chronic kidney disease with stage 1 through stage 4 chronic kidney disease, or unspecified chronic kidney disease: Secondary | ICD-10-CM | POA: Diagnosis not present

## 2022-10-24 DIAGNOSIS — D638 Anemia in other chronic diseases classified elsewhere: Secondary | ICD-10-CM | POA: Diagnosis not present

## 2022-10-24 DIAGNOSIS — N189 Chronic kidney disease, unspecified: Secondary | ICD-10-CM | POA: Diagnosis not present

## 2022-10-25 DIAGNOSIS — N189 Chronic kidney disease, unspecified: Secondary | ICD-10-CM | POA: Diagnosis not present

## 2022-10-25 DIAGNOSIS — I129 Hypertensive chronic kidney disease with stage 1 through stage 4 chronic kidney disease, or unspecified chronic kidney disease: Secondary | ICD-10-CM | POA: Diagnosis not present

## 2022-10-25 DIAGNOSIS — E559 Vitamin D deficiency, unspecified: Secondary | ICD-10-CM | POA: Diagnosis not present

## 2022-10-25 DIAGNOSIS — I5032 Chronic diastolic (congestive) heart failure: Secondary | ICD-10-CM | POA: Diagnosis not present

## 2022-10-25 DIAGNOSIS — E1122 Type 2 diabetes mellitus with diabetic chronic kidney disease: Secondary | ICD-10-CM | POA: Diagnosis not present

## 2022-10-25 DIAGNOSIS — D638 Anemia in other chronic diseases classified elsewhere: Secondary | ICD-10-CM | POA: Diagnosis not present

## 2022-10-25 DIAGNOSIS — Z79899 Other long term (current) drug therapy: Secondary | ICD-10-CM | POA: Diagnosis not present

## 2022-11-01 ENCOUNTER — Ambulatory Visit: Payer: Medicare Other | Attending: Cardiology | Admitting: Cardiology

## 2022-11-01 ENCOUNTER — Encounter: Payer: Self-pay | Admitting: Cardiology

## 2022-11-01 VITALS — BP 142/84 | HR 50 | Ht 61.0 in | Wt 123.6 lb

## 2022-11-01 DIAGNOSIS — I5032 Chronic diastolic (congestive) heart failure: Secondary | ICD-10-CM | POA: Diagnosis not present

## 2022-11-01 DIAGNOSIS — I10 Essential (primary) hypertension: Secondary | ICD-10-CM

## 2022-11-01 NOTE — Patient Instructions (Signed)
Medication Instructions:  Your physician recommends that you continue on your current medications as directed. Please refer to the Current Medication list given to you today.   Labwork: None  Testing/Procedures: None  Follow-Up: Follow up with Dr. Harl Bowie in 3 months.   Any Other Special Instructions Will Be Listed Below (If Applicable).     If you need a refill on your cardiac medications before your next appointment, please call your pharmacy.

## 2022-11-01 NOTE — Progress Notes (Signed)
Clinical Summary Emily Jennings is a 89 y.o.female seen today as a focused follow up for history of chronic HFpEF.   1. Chronic HFpEF - -08/2022 echo: LVEF 60-65%, grade II dd, - admit 08/2022 with volume overload - home weight 122 lbs -some LE edema mild, no SOB/DOE. No orthopnea - takes lasix 9m once every 2 days.     2.HTN - home bps 130s/70s - reports some white coat HTN   3. CKD IV   4. DM2 Past Medical History:  Diagnosis Date   Allergy    Anxiety    ANXIETY DISORDER, GENERALIZED 07/15/2007   Qualifier: Diagnosis of  By: DRennie Plowman    Arthritis    Cancer ([facility] 2009   breast, carcinoma in situ left   Carcinoma in situ of breast 05/21/2008   Qualifier: Diagnosis of  By: SMoshe CiproMD, Margaret  Diagnosed in 2009, completed 5 year course of tamoxifen, no evidence of recurrence    Carotid stenosis    11/16/2005  mild plaque formation and stenosis proximal right ECA   Cataract    Complication of anesthesia    Coronary artery disease    cardiac catheterization on 03/20/2006  LAD mid 40% stenosis, left circumflex mild 40% stenosis, RCA mid-vessel 40% to 50% lesion   EF 60%   Diabetes mellitus    GERD (gastroesophageal reflux disease)    Hernia, inguinal    left   Hyperglycemia    Hypertension    Insomnia 11/16/2011   Low blood potassium    Non-insulin dependent type 2 diabetes mellitus ([place]    Osteoporosis    Shortness of breath    2D Echocardiogram 01/26/2009   EF of greater than 55%, mild MR, mild TR, normal ventricular function   Thickened endometrium 10/26/2017   Noted by gyne in 2017, missed 6 month follow up, referred in 09/2017   Ventricular tachycardia, non-sustained ([facility]    developed during stress test 02/08/2006, spontaneously aborted, mild reversible apical defect     Allergies  Allergen Reactions   Benadryl [Diphenhydramine Hcl] Hypertension   Citalopram Other (See Comments)    unknown   Metformin And Related Diarrhea    Lost appetite and  weight    Tramadol Other (See Comments)    Felt light headed and dizzy   Crestor [Rosuvastatin] Other (See Comments)    "Feet swelling", makes her feel weak     Current Outpatient Medications  Medication Sig Dispense Refill   alendronate (FOSAMAX) 70 MG tablet TAKE 1 TABLET BY MOUTH EVERY 7 DAYS. TAKE WITH A FULL GLASS OF WATER ON AN EMPTY STOMACH (Patient not taking: Reported on 09/28/2022) 12 tablet 1   amLODipine-olmesartan (AZOR) 10-40 MG tablet TAKE 1 TABLET BY MOUTH EVERY DAY 90 tablet 1   aspirin EC 81 MG tablet Take 1 tablet (81 mg total) by mouth daily with breakfast. 30 tablet 11   blood glucose meter kit and supplies KIT One touch Ultra. Use three times daily as directed. (FOR ICD E11.65) 1 each 0   carvedilol (COREG) 12.5 MG tablet Take 1.5 tablets (18.75 mg total) by mouth in the morning and at bedtime. 270 tablet 3   furosemide (LASIX) 40 MG tablet Take 1 tablet (40 mg total) by mouth 2 (two) times daily. 60 tablet 2   glucose blood (ONETOUCH ULTRA) test strip USE TO TEST 2 TIMES A DAY 100 strip 2   insulin lispro protamine-lispro (HUMALOG MIX 75/25) (75-25) 100 UNIT/ML SUSP injection Inject  10-15 Units into the skin See admin instructions. 10 units bid if BG above 90 15 units  if it is over 300     Insulin Pen Needle (PEN NEEDLES) 31G X 8 MM MISC 1 each by Does not apply route in the morning and at bedtime. 200 each 2   Insulin Pen Needle 32G X 4 MM MISC 1 each by Does not apply route as directed. 100 each 3   Insulin Syringe-Needle U-100 (BD INSULIN SYRINGE U/F) 31G X 5/16" 0.5 ML MISC USE AS DIRECTED TWICE DAILY 200 each 2   OneTouch Delica Lancets [id number] MISC 1 each by Does not apply route 3 (three) times daily. Use to check glucose three times daily 200 each 2   Potassium Chloride ER 20 MEQ TBCR Take 20 mEq by mouth daily. 1 tab daily by mouth--- take while taking Lasix/furosemide 30 tablet 1   rosuvastatin (CRESTOR) 5 MG tablet TAKE 1 TABLET (5 MG TOTAL) BY MOUTH DAILY.  (Patient taking differently: Take 5 mg by mouth daily.) 90 tablet 3   spironolactone (ALDACTONE) 25 MG tablet Take 1 tablet (25 mg total) by mouth daily. (Patient not taking: Reported on 10/10/2022) 30 tablet 6   No current facility-administered medications for this visit.     Past Surgical History:  Procedure Laterality Date   BREAST LUMPECTOMY Left 2009   Left breast 2009   CATARACT EXTRACTION W/PHACO Left 10/28/2014   Procedure: PHACO EMULSION CATARACT EXTRACTION WITH INTRAOCULAR LENS IMPLANT LEFT EYE ([place]);  Surgeon: Marylynn Pearson, MD;  Location: [place];  Service: Ophthalmology;  Laterality: Left;   COLONOSCOPY     cyst removed from left foot     REFRACTIVE SURGERY Left      Allergies  Allergen Reactions   Benadryl [Diphenhydramine Hcl] Hypertension   Citalopram Other (See Comments)    unknown   Metformin And Related Diarrhea    Lost appetite and weight    Tramadol Other (See Comments)    Felt light headed and dizzy   Crestor [Rosuvastatin] Other (See Comments)    "Feet swelling", makes her feel weak      Family History  Problem Relation Age of Onset   Hypertension Mother    Hyperlipidemia Mother    Stroke Mother    Urticaria Mother    Cancer Father        pancreatic   Colon cancer Father    Heart disease Brother 55       bypass   Heart disease Brother 10       bypass   Arthritis Other    Asthma Other    Diabetes Other    Colon cancer Paternal Aunt    Esophageal cancer Neg Hx    Stomach cancer Neg Hx    Rectal cancer Neg Hx      Social History Emily Jennings reports that she has never smoked. She has never been exposed to tobacco smoke. She has never used smokeless tobacco. Emily Jennings reports no history of alcohol use.   Review of Systems CONSTITUTIONAL: No weight loss, fever, chills, weakness or fatigue.  HEENT: Eyes: No visual loss, blurred vision, double vision or yellow sclerae.No hearing loss, sneezing, congestion, runny nose or sore throat.  SKIN: No  rash or itching.  CARDIOVASCULAR: per hpi RESPIRATORY: No shortness of breath, cough or sputum.  GASTROINTESTINAL: No anorexia, nausea, vomiting or diarrhea. No abdominal pain or blood.  GENITOURINARY: No burning on urination, no polyuria NEUROLOGICAL: No headache, dizziness, syncope, paralysis, ataxia,  numbness or tingling in the extremities. No change in bowel or bladder control.  MUSCULOSKELETAL: No muscle, back pain, joint pain or stiffness.  LYMPHATICS: No enlarged nodes. No history of splenectomy.  PSYCHIATRIC: No history of depression or anxiety.  ENDOCRINOLOGIC: No reports of sweating, cold or heat intolerance. No polyuria or polydipsia.  Marland Kitchen   Physical Examination Today's Vitals   11/01/22 1107  BP: (!) 142/84  Pulse: (!) 50  SpO2: 97%  Weight: 123 lb 9.6 oz (56.1 kg)  Height: 5' 1"$  (1.549 m)   Body mass index is 23.35 kg/m.  Gen: resting comfortably, no acute distress HEENT: no scleral icterus, pupils equal round and reactive, no palptable cervical adenopathy,  CV: RRR, 3/6 systolic murmur rusb, no jvd Resp: Clear to auscultation bilaterally GI: abdomen is soft, non-tender, non-distended, normal bowel sounds, no hepatosplenomegaly MSK: trace bilateral edema Skin: warm, no rash Neuro:  no focal deficits Psych: appropriate affect   Diagnostic Studies  08/2022 echo 1. Left ventricular ejection fraction, by estimation, is 60 to 65%. The  left ventricle has normal function. The left ventricle has no regional  wall motion abnormalities. There is mild left ventricular hypertrophy.  Left ventricular diastolic parameters  are consistent with Grade II diastolic dysfunction (pseudonormalization).  Elevated left ventricular end-diastolic pressure.   2. Right ventricular systolic function is normal. The right ventricular  size is normal. There is moderately elevated pulmonary artery systolic  pressure.   3. Right atrial size was mildly dilated.   4. 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 The mitral valve is abnormal. Trivial mitral valve  regurgitation. No evidence of mitral stenosis.   5. Higher gradients likely from high CO DVI 0.69 and AVA aas high as 1.95  by VTI suggests not truly moderate AS Acceleration time also fairly normal  . The aortic valve is tricuspid. There is moderate calcification of the  aortic valve. There is moderate  thickening of the aortic valve. Aortic valve regurgitation is not  visualized. Mild aortic valve stenosis.   6. The inferior vena cava is dilated in size with >50% respiratory  variability, suggesting right atrial pressure of 8 mmHg.    Assessment and Plan   1.Chronic HFpEF - home weights stable 120-122, no cardiopulmnoary symptoms - she has cut back on her lasix essentially taking 20m every 2 days, on this regimen she has actually done well. Cr has improved since January when she was likely overdiursed. She is vigilant about checking weights and will take additional prn lasix based on weight gain, ok with her self adjusting her diuretic at this time  2. HTN - history of whtie coat HTN - home bp's at goal, continue current meds     JArnoldo Lenis M.D.

## 2022-11-06 ENCOUNTER — Ambulatory Visit: Payer: Medicare Other | Admitting: "Endocrinology

## 2022-11-10 ENCOUNTER — Ambulatory Visit: Payer: Self-pay | Admitting: *Deleted

## 2022-11-10 DIAGNOSIS — N200 Calculus of kidney: Secondary | ICD-10-CM | POA: Diagnosis not present

## 2022-11-10 DIAGNOSIS — R808 Other proteinuria: Secondary | ICD-10-CM | POA: Diagnosis not present

## 2022-11-10 DIAGNOSIS — R778 Other specified abnormalities of plasma proteins: Secondary | ICD-10-CM | POA: Diagnosis not present

## 2022-11-10 DIAGNOSIS — R9341 Abnormal radiologic findings on diagnostic imaging of renal pelvis, ureter, or bladder: Secondary | ICD-10-CM | POA: Diagnosis not present

## 2022-11-10 DIAGNOSIS — I350 Nonrheumatic aortic (valve) stenosis: Secondary | ICD-10-CM | POA: Diagnosis not present

## 2022-11-10 DIAGNOSIS — N170 Acute kidney failure with tubular necrosis: Secondary | ICD-10-CM | POA: Diagnosis not present

## 2022-11-10 DIAGNOSIS — I5032 Chronic diastolic (congestive) heart failure: Secondary | ICD-10-CM | POA: Diagnosis not present

## 2022-11-10 DIAGNOSIS — I129 Hypertensive chronic kidney disease with stage 1 through stage 4 chronic kidney disease, or unspecified chronic kidney disease: Secondary | ICD-10-CM | POA: Diagnosis not present

## 2022-11-10 DIAGNOSIS — N281 Cyst of kidney, acquired: Secondary | ICD-10-CM | POA: Diagnosis not present

## 2022-11-10 DIAGNOSIS — N189 Chronic kidney disease, unspecified: Secondary | ICD-10-CM | POA: Diagnosis not present

## 2022-11-10 DIAGNOSIS — E876 Hypokalemia: Secondary | ICD-10-CM | POA: Diagnosis not present

## 2022-11-10 DIAGNOSIS — E211 Secondary hyperparathyroidism, not elsewhere classified: Secondary | ICD-10-CM | POA: Diagnosis not present

## 2022-11-10 NOTE — Patient Outreach (Signed)
  Care Coordination   11/10/2022 Name: Emily Jennings MRN: [id number] DOB: 05/12/1934   Care Coordination Outreach Attempts:  An unsuccessful telephone outreach was attempted today to offer the patient information about available care coordination services as a benefit of their health plan.   Follow Up Plan:  Additional outreach attempts will be made to offer the patient care coordination information and services.   Encounter Outcome:  No Answer   Care Coordination Interventions:  No, not indicated    Emaley Applin L. Lavina Hamman, RN, BSN, [place] Coordinator Office number [phone]

## 2022-11-13 ENCOUNTER — Ambulatory Visit: Payer: Medicare Other | Admitting: Nurse Practitioner

## 2022-11-15 ENCOUNTER — Encounter: Payer: Medicare Other | Admitting: Family Medicine

## 2022-11-15 ENCOUNTER — Encounter: Payer: Self-pay | Admitting: Family Medicine

## 2022-11-15 NOTE — Progress Notes (Deleted)
Subjective:   Emily Jennings is a 89 y.o. female who presents for Medicare Annual (Subsequent) preventive examination.   I connected with  [name] on 11/15/22 by a audio enabled telemedicine application and verified that I am speaking with the correct person using two identifiers.  Patient Location: Home  Provider Location: Office/Clinic  I discussed the limitations of evaluation and management by telemedicine. The patient expressed understanding and agreed to proceed.   Review of Systems    Patient denies pain, fever, chills, chest pain, palpations ,shortness of breath, blurred vision,cough, abdominal pain, nausea, vomiting, headache, dizziness. Patient is not feeling nervous or anxious          Objective:    There were no vitals filed for this visit. There is no height or weight on file to calculate BMI.     09/12/2022    9:57 PM 09/05/2022    8:33 PM 09/05/2022    2:52 PM 04/08/2022    3:33 PM 03/26/2022    1:51 AM 03/26/2022    1:49 AM 03/22/2022    8:41 PM  Advanced Directives  Does Patient Have a Medical Advance Directive? No Yes Yes Yes  No   Type of Corporate treasurer of [place];Living will [place];Living will Living will     Does patient want to make changes to medical advance directive? No - Patient declined No - Patient declined       Would patient like information on creating a medical advance directive? No - Patient declined    No - Patient declined  No - Patient declined    Current Medications (verified) Outpatient Encounter Medications as of 11/15/2022  Medication Sig   alendronate (FOSAMAX) 70 MG tablet TAKE 1 TABLET BY MOUTH EVERY 7 DAYS. TAKE WITH A FULL GLASS OF WATER ON AN EMPTY STOMACH (Patient not taking: Reported on 09/28/2022)   amLODipine-olmesartan (AZOR) 10-40 MG tablet TAKE 1 TABLET BY MOUTH EVERY DAY   aspirin EC 81 MG tablet Take 1 tablet (81 mg total) by mouth daily with breakfast.   blood glucose meter  kit and supplies KIT One touch Ultra. Use three times daily as directed. (FOR ICD E11.65)   carvedilol (COREG) 12.5 MG tablet Take 12.5 mg by mouth 2 (two) times daily with a meal.   furosemide (LASIX) 40 MG tablet Take 1 tablet (40 mg total) by mouth 2 (two) times daily.   glucose blood (ONETOUCH ULTRA) test strip USE TO TEST 2 TIMES A DAY   insulin lispro protamine-lispro (HUMALOG MIX 75/25) (75-25) 100 UNIT/ML SUSP injection Inject 10-15 Units into the skin See admin instructions. 10 units bid if BG above 90 15 units  if it is over 300   Insulin Pen Needle (PEN NEEDLES) 31G X 8 MM MISC 1 each by Does not apply route in the morning and at bedtime.   Insulin Pen Needle 32G X 4 MM MISC 1 each by Does not apply route as directed.   Insulin Syringe-Needle U-100 (BD INSULIN SYRINGE U/F) 31G X 5/16" 0.5 ML MISC USE AS DIRECTED TWICE DAILY   OneTouch Delica Lancets 99991111 MISC 1 each by Does not apply route 3 (three) times daily. Use to check glucose three times daily   Potassium Chloride ER 20 MEQ TBCR Take 20 mEq by mouth daily. 1 tab daily by mouth--- take while taking Lasix/furosemide   rosuvastatin (CRESTOR) 5 MG tablet TAKE 1 TABLET (5 MG TOTAL) BY MOUTH DAILY. (Patient taking  differently: Take 5 mg by mouth daily.)   spironolactone (ALDACTONE) 25 MG tablet Take 1 tablet (25 mg total) by mouth daily. (Patient not taking: Reported on 10/10/2022)   No facility-administered encounter medications on file as of 11/15/2022.    Allergies (verified) Benadryl [diphenhydramine hcl], Citalopram, Metformin and related, Tramadol, and Crestor [rosuvastatin]   History: Past Medical History:  Diagnosis Date   Allergy    Anxiety    ANXIETY DISORDER, GENERALIZED 07/15/2007   Qualifier: Diagnosis of  By: Rennie Plowman     Arthritis    Cancer [facility]) 2009   breast, carcinoma in situ left   Carcinoma in situ of breast 05/21/2008   Qualifier: Diagnosis of  By: Moshe Cipro MD, Margaret  Diagnosed in 2009, completed 5  year course of tamoxifen, no evidence of recurrence    Carotid stenosis    11/16/2005  mild plaque formation and stenosis proximal right ECA   Cataract    Complication of anesthesia    Coronary artery disease    cardiac catheterization on 03/20/2006  LAD mid 40% stenosis, left circumflex mild 40% stenosis, RCA mid-vessel 40% to 50% lesion   EF 60%   Diabetes mellitus    GERD (gastroesophageal reflux disease)    Hernia, inguinal    left   Hyperglycemia    Hypertension    Insomnia 11/16/2011   Low blood potassium    Non-insulin dependent type 2 diabetes mellitus ([place])    Osteoporosis    Shortness of breath    2D Echocardiogram 01/26/2009   EF of greater than 55%, mild MR, mild TR, normal ventricular function   Thickened endometrium 10/26/2017   Noted by gyne in 2017, missed 6 month follow up, referred in 09/2017   Ventricular tachycardia, non-sustained ([place])    developed during stress test 02/08/2006, spontaneously aborted, mild reversible apical defect   Past Surgical History:  Procedure Laterality Date   BREAST LUMPECTOMY Left 2009   Left breast 2009   CATARACT EXTRACTION W/PHACO Left 10/28/2014   Procedure: PHACO EMULSION CATARACT EXTRACTION WITH INTRAOCULAR LENS IMPLANT LEFT EYE ([place]);  Surgeon: Marylynn Pearson, MD;  Location: [facility];  Service: Ophthalmology;  Laterality: Left;   COLONOSCOPY     cyst removed from left foot     REFRACTIVE SURGERY Left    Family History  Problem Relation Age of Onset   Hypertension Mother    Hyperlipidemia Mother    Stroke Mother    Urticaria Mother    Cancer Father        pancreatic   Colon cancer Father    Heart disease Brother 23       bypass   Heart disease Brother 33       bypass   Arthritis Other    Asthma Other    Diabetes Other    Colon cancer Paternal Aunt    Esophageal cancer Neg Hx    Stomach cancer Neg Hx    Rectal cancer Neg Hx    Social History   Socioeconomic History   Marital status: Married    Spouse name: Not on file    Number of children: 2   Years of education: na   Highest education level: Some college, no degree  Occupational History   Occupation: retired    Fish farm manager: retired  Tobacco Use   Smoking status: Never    Passive exposure: Never   Smokeless tobacco: Never  Vaping Use   Vaping Use: Never used  Substance and Sexual Activity   Alcohol  use: No   Drug use: No   Sexual activity: Not Currently    Birth control/protection: Post-menopausal  Other Topics Concern   Not on file  Social History Narrative   Lives with her husband and son and attends yoga at the [employer] 2 days a week and speaks to her grandson nightly on the phone   Social Determinants of Health   Financial Resource Strain: Low Risk  (09/20/2022)   Overall Financial Resource Strain (CARDIA)    Difficulty of Paying Living Expenses: Not very hard  Food Insecurity: No Food Insecurity (09/08/2022)   Hunger Vital Sign    Worried About Running Out of Food in the Last Year: Never true    [place] in the Last Year: Never true  Transportation Needs: No Transportation Needs (09/20/2022)   PRAPARE - Hydrologist (Medical): No    Lack of Transportation (Non-Medical): No  Physical Activity: Insufficiently Active (11/14/2021)   Exercise Vital Sign    Days of Exercise per Week: 7 days    Minutes of Exercise per Session: 20 min  Stress: No Stress Concern Present (11/14/2021)   [place]    Feeling of Stress : Only a little  Social Connections: Moderately Integrated (11/14/2021)   Social Connection and Isolation Panel [NHANES]    Frequency of Communication with Friends and Family: More than three times a week    Frequency of Social Gatherings with Friends and Family: Once a week    Attends Religious Services: More than 4 times per year    Active Member of [employer] or Organizations: No    Attends Archivist Meetings: Never    Marital  Status: Married    Tobacco Counseling Counseling given: Not Answered   Clinical Intake:                 Diabetic? Yes         Activities of Daily Living    09/13/2022    4:00 PM 09/08/2022    1:11 PM  In your present state of health, do you have any difficulty performing the following activities:  Hearing? 0 0  Vision? 0 1  Difficulty concentrating or making decisions? 0 0  Walking or climbing stairs? 0 0  Dressing or bathing? 0 0  Doing errands, shopping? 0 0  Preparing Food and eating ?  N  Using the Toilet?  N  In the past six months, have you accidently leaked urine?  Y  Do you have problems with loss of bowel control?  N  Managing your Medications?  N  Managing your Finances?  N  Housekeeping or managing your Housekeeping?  Y    Patient Care Team: [name], MD as PCP - General (Family Medicine) Harl Bowie Alphonse Guild, MD as PCP - Cardiology (Cardiology) Cassandria Anger, MD as Consulting Physician (Endocrinology) Marylynn Pearson, MD as Consulting Physician (Ophthalmology) Beryle Lathe, [facility] (Inactive) (Pharmacist) Barbaraann Faster, RN as [place] any recent Medical Services you may have received from other than [facility] providers in the past year (date may be approximate).     Assessment:   This is a routine wellness examination for [facility].  Hearing/Vision screen No results found.  Dietary issues and exercise activities discussed: Advise lifestyle modifications follow diet low in saturated fat, reduce dietary salt intake, avoid fatty foods, maintain an exercise routine 3 to 5 days  a week for a minimum total of 150 minutes.    Goals Addressed   None    Depression Screen    10/10/2022    1:21 PM 09/28/2022    9:19 AM 09/08/2022    1:09 PM 09/05/2022   11:17 AM 08/29/2022    9:23 AM 06/28/2022    1:53 PM 04/14/2022    9:16 AM  PHQ 2/9 Scores  PHQ - 2 Score 0 0 0 0 0 0 0    Fall  Risk    10/10/2022    1:21 PM 09/28/2022    9:19 AM 09/08/2022   12:56 PM 09/05/2022   11:16 AM 08/29/2022    9:22 AM  Fall Risk   Falls in the past year? 0 0 0 0 0  Number falls in past yr: 0 0 0 0 0  Injury with Fall? 0 0 0 0 0  Risk for fall due to : No Fall Risks Impaired balance/gait;History of fall(s)  No Fall Risks History of fall(s)  Follow up Falls evaluation completed Falls evaluation completed  Falls evaluation completed Falls evaluation completed    [place]:  Any stairs in or around the home? {YES/NO:21197} If so, are there any without handrails? {YES/NO:21197} Home free of loose throw rugs in walkways, pet beds, electrical cords, etc? {YES/NO:21197} Adequate lighting in your home to reduce risk of falls? {YES/NO:21197}  ASSISTIVE DEVICES UTILIZED TO PREVENT FALLS:  Life alert? {YES/NO:21197} Use of a cane, walker or w/c? {YES/NO:21197} Grab bars in the bathroom? {YES/NO:21197} Shower chair or bench in shower? {YES/NO:21197} Elevated toilet seat or a handicapped toilet? {YES/NO:21197}    Cognitive Function:    11/14/2021    3:20 PM  MMSE - Mini Mental State Exam  Not completed: Unable to complete        11/14/2021    3:20 PM 11/10/2020   10:53 AM 11/05/2019    2:18 PM 10/09/2018   10:22 AM 10/01/2017    2:22 PM  6CIT Screen  What Year? 0 points 0 points 0 points 0 points 0 points  What month? 0 points 0 points 0 points 0 points 0 points  What time? 0 points 0 points 0 points 0 points 0 points  Count back from 20 0 points 0 points 0 points 0 points 2 points  Months in reverse 0 points 0 points 0 points 0 points 0 points  Repeat phrase 0 points 0 points 2 points 2 points 2 points  Total Score 0 points 0 points 2 points 2 points 4 points    Immunizations Immunization History  Administered Date(s) Administered   Fluad Quad(high Dose 65+) 06/02/2019, 05/27/2020, 05/27/2021, 06/28/2022   Influenza Split 07/17/2011    Influenza Whole 06/18/2007, 07/29/2009, 05/16/2013   Influenza, High Dose Seasonal PF 08/13/2018   Influenza,inj,Quad PF,6+ Mos 05/19/2014, 08/22/2016, 05/17/2017   Moderna Covid-19 Vaccine Bivalent Booster 69yr & up 02/17/2022   Moderna SARS-COV2 Booster Vaccination 01/31/2021   Moderna Sars-Covid-2 Vaccination 10/26/2019, 11/26/2019, 07/29/2020   Pneumococcal Conjugate-13 03/24/2014   Pneumococcal Polysaccharide-23 05/20/2008   Td 06/18/2002   Tdap 07/17/2011, 12/28/2020    TDAP status: Up to date  Flu Vaccine status: Up to date  Pneumococcal vaccine status: Up to date  Covid-19 vaccine status: Information provided on how to obtain vaccines.   Qualifies for Shingles Vaccine? Yes   Zostavax completed No   Shingrix Completed?: No.    Education has been provided regarding the importance of this vaccine. Patient  has been advised to call insurance company to determine out of pocket expense if they have not yet received this vaccine. Advised may also receive vaccine at local pharmacy or Health Dept. Verbalized acceptance and understanding.  Screening Tests Health Maintenance  Topic Date Due   Zoster Vaccines- Shingrix (1 of 2) Never done   OPHTHALMOLOGY EXAM  11/10/2021   FOOT EXAM  10/23/2022   COVID-19 Vaccine (6 - 2023-24 season) 12/24/2022   HEMOGLOBIN A1C  01/05/2023   Medicare Annual Wellness (AWV)  10/17/2023   DTaP/Tdap/Td (4 - Td or Tdap) 12/29/2030   Pneumonia Vaccine 35+ Years old  Completed   INFLUENZA VACCINE  Completed   DEXA SCAN  Completed   HPV VACCINES  Aged Out    Health Maintenance  Health Maintenance Due  Topic Date Due   Zoster Vaccines- Shingrix (1 of 2) Never done   OPHTHALMOLOGY EXAM  11/10/2021   FOOT EXAM  10/23/2022    Colorectal cancer screening: No longer required.   Mammogram status: Completed 2023. Repeat every year  Bone Density status: Ordered 2024. Pt provided with contact info and advised to call to schedule appt.  Lung Cancer  Screening: (Low Dose CT Chest recommended if Age 80-80 years, 30 pack-year currently smoking OR have quit w/in 15years.) does not qualify.   Lung Cancer Screening Referral: N/A  Additional Screening:  Hepatitis C Screening: does qualify; not completed   Vision Screening: Recommended annual ophthalmology exams for early detection of glaucoma and other disorders of the eye. Is the patient up to date with their annual eye exam?  No  Who is the provider or what is the name of the office in which the patient attends annual eye exams? Dr. Lanell Matar  If pt is not established with a provider, would they like to be referred to a provider to establish care? No .   Dental Screening: Recommended annual dental exams for proper oral hygiene  Community Resource Referral / Chronic Care Management: CRR required this visit?  No   CCM required this visit?  No      Plan:     I have personally reviewed and noted the following in the patient's chart:   Medical and social history Use of alcohol, tobacco or illicit drugs  Current medications and supplements including opioid prescriptions. Patient is not currently taking opioid prescriptions. Functional ability and status Nutritional status Physical activity Advanced directives List of other physicians Hospitalizations, surgeries, and ER visits in previous 12 months Vitals Screenings to include cognitive, depression, and falls Referrals and appointments  In addition, I have reviewed and discussed with patient certain preventive protocols, quality metrics, and best practice recommendations. A written personalized care plan for preventive services as well as general preventive health recommendations were provided to patient.     [place], [place]   11/15/2022

## 2022-11-15 NOTE — Progress Notes (Signed)
Erroneous encounter-disregard

## 2022-11-18 ENCOUNTER — Other Ambulatory Visit: Payer: Self-pay | Admitting: "Endocrinology

## 2022-11-18 DIAGNOSIS — E1122 Type 2 diabetes mellitus with diabetic chronic kidney disease: Secondary | ICD-10-CM

## 2022-11-20 ENCOUNTER — Ambulatory Visit: Payer: Self-pay | Admitting: *Deleted

## 2022-11-20 ENCOUNTER — Encounter: Payer: Self-pay | Admitting: Family Medicine

## 2022-11-20 NOTE — Patient Outreach (Signed)
  Care Coordination   11/20/2022 Name: Emily Jennings MRN: [id number] DOB: [birth year]   Care Coordination Outreach Attempts:  An unsuccessful telephone outreach was attempted today to offer the patient information about available care coordination services as a benefit of their health plan.   Follow Up Plan:  Additional outreach attempts will be made to offer the patient care coordination information and services.   Encounter Outcome:  No Answer   Care Coordination Interventions:  No, not indicated    Kenise Barraco L. Lavina Hamman, RN, BSN, [place] Coordinator Office number [phone]

## 2022-11-20 NOTE — Progress Notes (Signed)
This encounter was created in error - please disregard.

## 2022-11-29 ENCOUNTER — Telehealth: Payer: Self-pay | Admitting: *Deleted

## 2022-11-29 NOTE — Progress Notes (Unsigned)
  Care Coordination Note  11/29/2022 Name: [name] MRN: [id number] DOB: 07-26-1934  Emily Jennings is a 89 y.o. year old female who is a primary care patient of [name], MD and is actively engaged with the care management team. I reached out to [name] by phone today to assist with re-scheduling a follow up visit with the RN Case Manager  Follow up plan: Unsuccessful telephone outreach attempt made. A HIPAA compliant phone message was left for the patient providing contact information and requesting a return call.   [place]  Direct Dial: [phone]

## 2022-11-30 NOTE — Progress Notes (Signed)
  Care Coordination Note  11/30/2022 Name: Emily Jennings MRN: [id number] DOB: 05-27-1934  Emily Jennings is a 89 y.o. year old female who is a primary care patient of [name], MD and is actively engaged with the care management team. I reached out to [name] by phone today to assist with re-scheduling a follow up visit with the RN Case Manager  Follow up plan: Telephone appointment with care management team member scheduled for:12/19/22  [place]  Direct Dial: [phone]

## 2022-11-30 NOTE — Progress Notes (Signed)
  Care Coordination Note  11/30/2022 Name: Emily Jennings MRN: [id number] DOB: 08/21/1934  Emily Jennings is a 89 y.o. year old female who is a primary care patient of [name], MD and is actively engaged with the care management team. I reached out to [name] by phone today to assist with re-scheduling a follow up visit with the RN Case Manager  Follow up plan: Unsuccessful telephone outreach attempt made. A HIPAA compliant phone message was left for the patient providing contact information and requesting a return call. We have been unable to make contact with the patient for follow up.   [place]  Direct Dial: [phone]

## 2022-12-08 ENCOUNTER — Ambulatory Visit: Payer: Self-pay | Admitting: *Deleted

## 2022-12-08 DIAGNOSIS — I5032 Chronic diastolic (congestive) heart failure: Secondary | ICD-10-CM | POA: Diagnosis not present

## 2022-12-08 DIAGNOSIS — I129 Hypertensive chronic kidney disease with stage 1 through stage 4 chronic kidney disease, or unspecified chronic kidney disease: Secondary | ICD-10-CM | POA: Diagnosis not present

## 2022-12-08 DIAGNOSIS — N189 Chronic kidney disease, unspecified: Secondary | ICD-10-CM | POA: Diagnosis not present

## 2022-12-08 DIAGNOSIS — E1122 Type 2 diabetes mellitus with diabetic chronic kidney disease: Secondary | ICD-10-CM | POA: Diagnosis not present

## 2022-12-08 DIAGNOSIS — I350 Nonrheumatic aortic (valve) stenosis: Secondary | ICD-10-CM | POA: Diagnosis not present

## 2022-12-08 NOTE — Patient Outreach (Signed)
  Care Coordination   Care coordination  Visit Note   12/08/2022 Name: Emily Jennings MRN: [id number] DOB: Nov 12, 1933  Emily Jennings is a 89 y.o. year old female who sees Emily Jennings, Emily Mallick, MD for primary care. I spoke with  Emily Jennings by phone today. She called to reschedule her outreach with Emily Jennings  What matters to the patients health and wellness today?  Rescheduling her outreach with Emily Jennings     Goals Addressed   None     SDOH assessments and interventions completed:  No     Care Coordination Interventions:  Yes, provided   Follow up plan: Follow up call scheduled for 03/08/23    Encounter Outcome:  Pt. Visit Completed   Emily Jennings L. Emily Penner, Emily, BSN, CCM [facility] Care Management Community Coordinator Office number [phone]

## 2022-12-19 ENCOUNTER — Encounter: Payer: Medicare Other | Admitting: *Deleted

## 2022-12-25 ENCOUNTER — Ambulatory Visit: Payer: Medicare Other | Admitting: Internal Medicine

## 2022-12-25 ENCOUNTER — Encounter: Payer: Self-pay | Admitting: Internal Medicine

## 2022-12-25 VITALS — BP 136/80 | HR 72 | Ht 60.0 in | Wt 130.0 lb

## 2022-12-25 DIAGNOSIS — Z794 Long term (current) use of insulin: Secondary | ICD-10-CM | POA: Diagnosis not present

## 2022-12-25 DIAGNOSIS — E1142 Type 2 diabetes mellitus with diabetic polyneuropathy: Secondary | ICD-10-CM

## 2022-12-25 DIAGNOSIS — E1122 Type 2 diabetes mellitus with diabetic chronic kidney disease: Secondary | ICD-10-CM

## 2022-12-25 DIAGNOSIS — N184 Chronic kidney disease, stage 4 (severe): Secondary | ICD-10-CM

## 2022-12-25 LAB — POCT GLYCOSYLATED HEMOGLOBIN (HGB A1C): Hemoglobin A1C: 6.9 % — AB (ref 4.0–5.6)

## 2022-12-25 LAB — POCT GLUCOSE (DEVICE FOR HOME USE): POC Glucose: 191 mg/dl — AB (ref 70–99)

## 2022-12-25 MED ORDER — DEXCOM G7 SENSOR MISC
1.0000 | 3 refills | Status: DC
Start: 2022-12-25 — End: 2023-06-02

## 2022-12-25 MED ORDER — INSULIN LISPRO PROT & LISPRO (75-25 MIX) 100 UNIT/ML KWIKPEN: PEN_INJECTOR | SUBCUTANEOUS | 11 refills | Status: DC

## 2022-12-25 MED ORDER — INSULIN PEN NEEDLE 32G X 4 MM MISC: 1.0000 | Freq: Two times a day (BID) | 3 refills | Status: DC

## 2022-12-25 NOTE — Progress Notes (Signed)
Name: Emily Jennings  MRN/ DOB: [id number], 12/14/1933   Age/ Sex: 89 y.o., female    PCP: [name], MD   Reason for Endocrinology Evaluation: Type 2 Diabetes Mellitus     Date of Initial Endocrinology Visit: 12/25/2022     PATIENT IDENTIFIER: Emily Jennings is a 89 y.o. female with a past medical history of DM, anxiety, Hx breast CA. The patient presented for initial endocrinology clinic visit on 12/25/2022 for consultative assistance with her diabetes management.    HPI: Emily Jennings was    Diagnosed with DM at age 57 Prior Medications tried/Intolerance: Metformin-weight loss Currently checking blood sugars 3 x / day,  before breakfast Hypoglycemia episodes : yes              Hemoglobin A1c has ranged from 6.8% in 2022, peaking at 9.2% in 2016.   Transferred care from Dr. Isidoro Donning office   Pt with heartburn symptoms   She adjusts her insulin based on pre-meal glucose  Pt with fear of hypoglycemia    HOME DIABETES REGIMEN: Humalog 75/25  10 units BID    Statin: yes ACE-I/ARB: no    METER DOWNLOAD SUMMARY: did not bring     DIABETIC COMPLICATIONS: Microvascular complications:  CKD IV Denies:  Last eye exam: Completed 2023  Macrovascular complications:   Denies: CAD, PVD, CVA   PAST HISTORY: Past Medical History:  Past Medical History:  Diagnosis Date   Allergy    Anxiety    ANXIETY DISORDER, GENERALIZED 07/15/2007   Qualifier: Diagnosis of  By: Chipper Herb     Arthritis    Cancer 2009   breast, carcinoma in situ left   Carcinoma in situ of breast 05/21/2008   Qualifier: Diagnosis of  By: [name] MD, Margaret  Diagnosed in 2009, completed 5 year course of tamoxifen, no evidence of recurrence    Carotid stenosis    11/16/2005  mild plaque formation and stenosis proximal right ECA   Cataract    Complication of anesthesia    Coronary artery disease    cardiac catheterization on 03/20/2006  LAD mid 40% stenosis, left circumflex mild 40%  stenosis, RCA mid-vessel 40% to 50% lesion   EF 60%   Diabetes mellitus    GERD (gastroesophageal reflux disease)    Hernia, inguinal    left   Hyperglycemia    Hypertension    Insomnia 11/16/2011   Low blood potassium    Non-insulin dependent type 2 diabetes mellitus    Osteoporosis    Shortness of breath    2D Echocardiogram 01/26/2009   EF of greater than 55%, mild MR, mild TR, normal ventricular function   Thickened endometrium 10/26/2017   Noted by gyne in 2017, missed 6 month follow up, referred in 09/2017   Ventricular tachycardia, non-sustained    developed during stress test 02/08/2006, spontaneously aborted, mild reversible apical defect   Past Surgical History:  Past Surgical History:  Procedure Laterality Date   BREAST LUMPECTOMY Left 2009   Left breast 2009   CATARACT EXTRACTION W/PHACO Left 10/28/2014   Procedure: PHACO EMULSION CATARACT EXTRACTION WITH INTRAOCULAR LENS IMPLANT LEFT EYE (IOC);  Surgeon: Chalmers Guest, MD;  Location: [facility] OR;  Service: Ophthalmology;  Laterality: Left;   COLONOSCOPY     cyst removed from left foot     REFRACTIVE SURGERY Left     Social History:  reports that she has never smoked. She has never been exposed to tobacco smoke. She has never used  smokeless tobacco. She reports that she does not drink alcohol and does not use drugs. Family History:  Family History  Problem Relation Age of Onset   Hypertension Mother    Hyperlipidemia Mother    Stroke Mother    Urticaria Mother    Cancer Father        pancreatic   Colon cancer Father    Heart disease Brother 40       bypass   Heart disease Brother 5069       bypass   Arthritis Other    Asthma Other    Diabetes Other    Colon cancer Paternal Aunt    Esophageal cancer Neg Hx    Stomach cancer Neg Hx    Rectal cancer Neg Hx      HOME MEDICATIONS: Allergies as of 12/25/2022       Reactions   Benadryl [diphenhydramine Hcl] Hypertension   Citalopram Other (See Comments)   unknown    Metformin And Related Diarrhea   Lost appetite and weight    Tramadol Other (See Comments)   Felt light headed and dizzy   Crestor [rosuvastatin] Other (See Comments)   "Feet swelling", makes her feel weak        Medication List        Accurate as of December 25, 2022  3:48 PM. If you have any questions, ask your nurse or doctor.          STOP taking these medications    HumaLOG Mix 75/25 (75-25) 100 UNIT/ML Susp injection Generic drug: insulin lispro protamine-lispro Replaced by: Insulin Lispro Prot & Lispro (75-25) 100 UNIT/ML Kwikpen Stopped by: Scarlette ShortsIbtehal J Justinn Welter, MD   Insulin Syringe-Needle U-100 31G X 5/16" 0.5 ML Misc Commonly known as: BD Insulin Syringe U/F Stopped by: Scarlette ShortsIbtehal J Nygeria Lager, MD   spironolactone 25 MG tablet Commonly known as: Aldactone Stopped by: Scarlette ShortsIbtehal J Aimee Heldman, MD       TAKE these medications    alendronate 70 MG tablet Commonly known as: FOSAMAX TAKE 1 TABLET BY MOUTH EVERY 7 DAYS. TAKE WITH A FULL GLASS OF WATER ON AN EMPTY STOMACH   amLODipine-olmesartan 10-40 MG tablet Commonly known as: AZOR TAKE 1 TABLET BY MOUTH EVERY DAY   aspirin EC 81 MG tablet Take 1 tablet (81 mg total) by mouth daily with breakfast.   blood glucose meter kit and supplies Kit One touch Ultra. Use three times daily as directed. (FOR ICD E11.65)   calcitRIOL 0.25 MCG capsule Commonly known as: ROCALTROL Take 0.25 mcg by mouth 3 (three) times a week.   carvedilol 12.5 MG tablet Commonly known as: COREG Take 12.5 mg by mouth 2 (two) times daily with a meal.   Dexcom G7 Sensor Misc 1 Device by Does not apply route as directed. Started by: Scarlette ShortsIbtehal J Melani Brisbane, MD   Farxiga 10 MG Tabs tablet Generic drug: dapagliflozin propanediol Take 10 mg by mouth every morning.   furosemide 40 MG tablet Commonly known as: LASIX Take 1 tablet (40 mg total) by mouth 2 (two) times daily.   Insulin Lispro Prot & Lispro (75-25) 100 UNIT/ML Kwikpen Commonly  known as: HumaLOG Mix 75/25 KwikPen Inject 10 Units into the skin daily before breakfast AND 12 Units daily before supper. Replaces: HumaLOG Mix 75/25 (75-25) 100 UNIT/ML Susp injection Started by: Scarlette ShortsIbtehal J Starlit Raburn, MD   Insulin Pen Needle 32G X 4 MM Misc 1 each by Does not apply route in the morning and at bedtime. What changed:  when to take this Another medication with the same name was removed. Continue taking this medication, and follow the directions you see here. Changed by: Scarlette Shorts, MD   OneTouch Delica Lancets 33G Misc 1 each by Does not apply route 3 (three) times daily. Use to check glucose three times daily   OneTouch Ultra test strip Generic drug: glucose blood USE TO TEST 2 TIMES A DAY   Potassium Chloride ER 20 MEQ Tbcr Take 20 mEq by mouth daily. 1 tab daily by mouth--- take while taking Lasix/furosemide   rosuvastatin 5 MG tablet Commonly known as: CRESTOR TAKE 1 TABLET (5 MG TOTAL) BY MOUTH DAILY.         ALLERGIES: Allergies  Allergen Reactions   Benadryl [Diphenhydramine Hcl] Hypertension   Citalopram Other (See Comments)    unknown   Metformin And Related Diarrhea    Lost appetite and weight    Tramadol Other (See Comments)    Felt light headed and dizzy   Crestor [Rosuvastatin] Other (See Comments)    "Feet swelling", makes her feel weak     REVIEW OF SYSTEMS: A comprehensive ROS was conducted with the patient and is negative except as per HPI    OBJECTIVE:   VITAL SIGNS: BP 136/80 (BP Location: Left Arm, Patient Position: Sitting, Cuff Size: Small)   Pulse 72   Ht 5' (1.524 m)   Wt 130 lb (59 kg)   SpO2 95%   BMI 25.39 kg/m    PHYSICAL EXAM:  General: Pt appears well and is in NAD  Neck: General: Supple without adenopathy or carotid bruits. Thyroid: Thyroid size normal.  No goiter or nodules appreciated.   Lungs: Clear with good BS bilat with no rales, rhonchi, or wheezes  Heart: RRR   Abdomen:  soft, nontender   Extremities:  Lower extremities - No pretibial edema.   Neuro: MS is good with appropriate affect, pt is alert and Ox3    DATA REVIEWED:  Lab Results  Component Value Date   HGBA1C 6.9 (A) 12/25/2022   HGBA1C 7.4 (A) 07/06/2022   HGBA1C 7.2 (H) 03/22/2022   Lab Results  Component Value Date   MICROALBUR 150 11/17/2020   LDLCALC 65 11/24/2021   CREATININE 2.07 (H) 09/28/2022   Lab Results  Component Value Date   MICRALBCREAT 1,090 (H) 04/03/2019    Lab Results  Component Value Date   CHOL 192 11/24/2021   HDL 118 11/24/2021   LDLCALC 65 11/24/2021   TRIG 43 11/24/2021   CHOLHDL 1.6 11/24/2021        Latest Reference Range & Units 09/28/22 09:56  Sodium 134 - 144 mmol/L 136  Potassium 3.5 - 5.2 mmol/L 5.1  Chloride 96 - 106 mmol/L 96  CO2 20 - 29 mmol/L 26  Glucose 70 - 99 mg/dL 932 (H)  BUN 8 - 27 mg/dL 40 (H)  Creatinine 3.55 - 1.00 mg/dL 7.32 (H)  Calcium 8.7 - 10.3 mg/dL 9.5  BUN/Creatinine Ratio 12 - 28  19  eGFR >59 mL/min/1.73 23 (L)   Old records , labs and images have been reviewed.    ASSESSMENT / PLAN / RECOMMENDATIONS:   1) Type 2 Diabetes Mellitus, Optimally controlled, With neuropathic and CKD IV complications - Most recent A1c of 6.9 %. Goal A1c < 7.5 %.    -Her A1c is acceptable but she does endorse hypoglycemia -The patient adjust her insulin based on Premeal glucose and at times postmeal glucose -For example, last night after  supper her BG was 320 mg/DL, she took 10 units of Humalog mix before supper, but when she noted the BG of 320 mg/DL she took an additional 5 units -I discussed the importance of safely using the insulin and taking it before the meal -I have discouraged the patient from adding extra insulin when she decides to as this will increase her risk of hypoglycemia -I again emphasized the importance of taking her insulin before breakfast and before supper -I have recommended CGM technology, I briefly went over the device in the  office, I referral has been placed to our CDE for training and a prescription will be sent to her DME supplier for Dexcom G7 -Limited glycemic agents due to CKD IV -Patient requested switching back to NovoLog mix, but when I went to order it this is not on the formulary and will be cost prohibitive   MEDICATIONS: Take Humalog mix 10 units before breakfast and 12 units before supper  EDUCATION / INSTRUCTIONS: BG monitoring instructions: Patient is instructed to check her blood sugars 2 times a day. Call [facility] if: BG persistently < 70  I reviewed the Rule of 15 for the treatment of hypoglycemia in detail with the patient. Literature supplied.   2) Diabetic complications:  Eye: Does not have known diabetic retinopathy.  Neuro/ Feet: Does  have known diabetic peripheral neuropathy. Renal: Patient does  have known baseline CKD. She is not on an ACEI/ARB at present.    Follow-up in 4 months   Signed electronically by: Abby Jaralla Eboney Claybrook, MD  [facility]  [facility] [address] [place], [place] [address] Phone: [phone] FAX: [phone]   CC: [name], MD [address] [place] [address] Phone: [phone]  Fax: [phone]    Return to Endocrinology clinic as below: Future Appointments  Date Time Provider Department Center  12/27/2022  1:15 PM AP-MM 1 AP-MM [place] [facility]  12/27/2022  1:30 PM AP-DG DEXA AP-DG [place] [facility]  12/28/2022  2:00 PM Clinton Gallant, RN THN-CCC None  01/18/2023  3:00 PM Iran Ouch Lennart Pall, PA-C [facility] [place] [facility]  02/27/2023  1:00 PM [name], MD RPC-RPC RPC  05/01/2023  2:00 PM Marlene Beidler, Konrad Dolores, MD LBPC-LBENDO None  12/03/2023  9:30 AM RPC-ANNUAL WELLNESS VISIT RPC-RPC RPC

## 2022-12-25 NOTE — Patient Instructions (Signed)
Take Humalog Mix 10 units before Breakfast and 12 units before Supper     HOW TO TREAT LOW BLOOD SUGARS (Blood sugar LESS THAN 70 MG/DL) Please follow the RULE OF 15 for the treatment of hypoglycemia treatment (when your (blood sugars are less than 70 mg/dL)   STEP 1: Take 15 grams of carbohydrates when your blood sugar is low, which includes:  3-4 GLUCOSE TABS  OR 3-4 OZ OF JUICE OR REGULAR SODA OR ONE TUBE OF GLUCOSE GEL    STEP 2: RECHECK blood sugar in 15 MINUTES STEP 3: If your blood sugar is still low at the 15 minute recheck --> then, go back to STEP 1 and treat AGAIN with another 15 grams of carbohydrates.

## 2022-12-27 ENCOUNTER — Ambulatory Visit (HOSPITAL_COMMUNITY)
Admission: RE | Admit: 2022-12-27 | Discharge: 2022-12-27 | Disposition: A | Discharge status: Home / Self Care | Payer: Medicare Other | Source: Ambulatory Visit | Attending: Family Medicine | Admitting: Family Medicine

## 2022-12-27 DIAGNOSIS — Z1239 Encounter for other screening for malignant neoplasm of breast: Secondary | ICD-10-CM

## 2022-12-27 DIAGNOSIS — Z780 Asymptomatic menopausal state: Secondary | ICD-10-CM | POA: Diagnosis not present

## 2022-12-27 DIAGNOSIS — M810 Age-related osteoporosis without current pathological fracture: Secondary | ICD-10-CM | POA: Diagnosis not present

## 2022-12-27 DIAGNOSIS — Z1231 Encounter for screening mammogram for malignant neoplasm of breast: Secondary | ICD-10-CM | POA: Diagnosis not present

## 2022-12-28 ENCOUNTER — Ambulatory Visit: Payer: Self-pay | Admitting: *Deleted

## 2022-12-28 NOTE — Patient Outreach (Signed)
  Care Coordination   Follow Up Visit Note   03/29/2023 late entry for 12/28/22 Name: Emily Jennings MRN: [id number] DOB: [birth year]  Emily Jennings is a 89 y.o. year old female who sees Emily Jennings, Emily Mallick, MD for primary care. I spoke with  Emily Jennings by phone today.  What matters to the patients health and wellness today?  Diabetes Hemoglobin A1c has ranged from 6.8% in 2022, peaking at 9.2% in 2016   Last A1c 6.9 on 12/25/22 She is trying to monitor her carbohydrate intake  Lives with husband & son Breakfast generally Oatmeal apples, boil egg toast tomato Interested in meal plans especially for breakfast Does not tolerate dairy products Reviewed MD appointments  Discussed how red meat, beef affect diabetic  Discussed eating a balanced diet   Goals Addressed             This Visit's Progress    Revised goal - I will manage my diet & insulin to prevent admissions. I will monitor for further kidney stones & contact providers for any changes. [facility] RN CM)       Care Coordination Interventions: Interventions Today    Flowsheet Row Most Recent Value  Chronic Disease   Chronic disease during today's visit Diabetes  General Interventions   General Interventions Discussed/Reviewed General Interventions Reviewed, Doctor Visits, Labs  Labs Hgb A1c every 6 months  Doctor Visits Discussed/Reviewed Doctor Visits Reviewed, PCP, Specialist  Durable Medical Equipment (DME) Glucomoter  PCP/Specialist Visits Compliance with follow-up visit  Education Interventions   Education Provided Provided Education  [diabetes nutrition]  Provided Verbal Education On Nutrition, Labs  Labs Reviewed Hgb A1c  Mental Health Interventions   Mental Health Discussed/Reviewed Mental Health Reviewed, Coping Strategies  Nutrition Interventions   Nutrition Discussed/Reviewed Nutrition Reviewed, Carbohydrate meal planning, Adding fruits and vegetables, Fluid intake, Portion sizes, Decreasing sugar intake,  Increasing proteins, Decreasing fats, Decreasing salt  Pharmacy Interventions   Pharmacy Dicussed/Reviewed Pharmacy Topics Discussed              SDOH assessments and interventions completed:  No  SDOH Interventions Today    Flowsheet Row Most Recent Value  SDOH Interventions   Food Insecurity Interventions Intervention Not Indicated  Housing Interventions Intervention Not Indicated  Transportation Interventions Intervention Not Indicated  Utilities Interventions Intervention Not Indicated  Financial Strain Interventions Intervention Not Indicated  Stress Interventions Intervention Not Indicated        Care Coordination Interventions:  Yes, provided   Follow up plan: Follow up call scheduled for 03/08/23    Encounter Outcome:  Pt. Visit Completed   Emily Jennings L. Noelle Penner, RN, BSN, CCM [facility] Care Management Community Coordinator Office number [phone]

## 2022-12-29 ENCOUNTER — Telehealth: Payer: Self-pay

## 2022-12-29 ENCOUNTER — Telehealth: Payer: Self-pay | Admitting: Family Medicine

## 2022-12-29 ENCOUNTER — Other Ambulatory Visit: Payer: Self-pay

## 2022-12-29 MED ORDER — POTASSIUM CHLORIDE ER 20 MEQ PO TBCR: 20.0000 meq | EXTENDED_RELEASE_TABLET | Freq: Every day | ORAL | 0 refills | Status: DC

## 2022-12-29 NOTE — Telephone Encounter (Signed)
Called and left a message for pt advising no message was left by [facility] but it looked like her PCP might have reached out to contact her. Pt advised to follow up with PCP. Or call back if she had any questions.

## 2022-12-29 NOTE — Telephone Encounter (Signed)
Pt called and left a message returning a call.

## 2022-12-29 NOTE — Telephone Encounter (Signed)
New message   1. Which medications need to be refilled? (please list name of each medication and dose if known) Potassium Chloride ER 20 MEQ TBCR   2. Which pharmacy/location (including street and city if local pharmacy) is medication to be sent to? [facility] in [place]    3. Do they need a 30 day or 90 day supply? 90 day supply

## 2022-12-29 NOTE — Telephone Encounter (Signed)
Med refilled.

## 2023-01-01 DIAGNOSIS — E1142 Type 2 diabetes mellitus with diabetic polyneuropathy: Secondary | ICD-10-CM | POA: Diagnosis not present

## 2023-01-09 ENCOUNTER — Other Ambulatory Visit: Payer: Self-pay | Admitting: "Endocrinology

## 2023-01-09 DIAGNOSIS — Z794 Long term (current) use of insulin: Secondary | ICD-10-CM

## 2023-01-15 ENCOUNTER — Other Ambulatory Visit: Payer: Self-pay | Admitting: Family Medicine

## 2023-01-17 ENCOUNTER — Telehealth: Payer: Self-pay

## 2023-01-17 DIAGNOSIS — N189 Chronic kidney disease, unspecified: Secondary | ICD-10-CM | POA: Diagnosis not present

## 2023-01-17 DIAGNOSIS — I129 Hypertensive chronic kidney disease with stage 1 through stage 4 chronic kidney disease, or unspecified chronic kidney disease: Secondary | ICD-10-CM | POA: Diagnosis not present

## 2023-01-17 DIAGNOSIS — I5032 Chronic diastolic (congestive) heart failure: Secondary | ICD-10-CM | POA: Diagnosis not present

## 2023-01-17 DIAGNOSIS — E1122 Type 2 diabetes mellitus with diabetic chronic kidney disease: Secondary | ICD-10-CM | POA: Diagnosis not present

## 2023-01-17 DIAGNOSIS — I350 Nonrheumatic aortic (valve) stenosis: Secondary | ICD-10-CM | POA: Diagnosis not present

## 2023-01-17 NOTE — Telephone Encounter (Signed)
Patient advised of changes and repeated them back to me.

## 2023-01-17 NOTE — Telephone Encounter (Signed)
Patient states that she has been having low blood sugar in the 50's. She states she passed out Sunday in church and she think she took more than the 10 units in the morning because it was her first time using it.  Yesterday  it was low again and she states that she took the 10 units and she is sure it was 10. This morning 336 and she has not taking any Humalog mix as of yet.

## 2023-01-18 ENCOUNTER — Ambulatory Visit: Payer: Medicare Other | Attending: Student | Admitting: Student

## 2023-01-18 ENCOUNTER — Encounter: Payer: Self-pay | Admitting: Student

## 2023-01-18 VITALS — BP 140/62 | HR 80 | Ht 60.0 in | Wt 129.0 lb

## 2023-01-18 DIAGNOSIS — I10 Essential (primary) hypertension: Secondary | ICD-10-CM | POA: Diagnosis not present

## 2023-01-18 DIAGNOSIS — N1832 Chronic kidney disease, stage 3b: Secondary | ICD-10-CM | POA: Diagnosis not present

## 2023-01-18 DIAGNOSIS — E1122 Type 2 diabetes mellitus with diabetic chronic kidney disease: Secondary | ICD-10-CM | POA: Diagnosis not present

## 2023-01-18 DIAGNOSIS — Z79899 Other long term (current) drug therapy: Secondary | ICD-10-CM

## 2023-01-18 DIAGNOSIS — I5032 Chronic diastolic (congestive) heart failure: Secondary | ICD-10-CM

## 2023-01-18 DIAGNOSIS — I129 Hypertensive chronic kidney disease with stage 1 through stage 4 chronic kidney disease, or unspecified chronic kidney disease: Secondary | ICD-10-CM | POA: Diagnosis not present

## 2023-01-18 DIAGNOSIS — E785 Hyperlipidemia, unspecified: Secondary | ICD-10-CM | POA: Diagnosis not present

## 2023-01-18 DIAGNOSIS — N189 Chronic kidney disease, unspecified: Secondary | ICD-10-CM | POA: Diagnosis not present

## 2023-01-18 DIAGNOSIS — I38 Endocarditis, valve unspecified: Secondary | ICD-10-CM | POA: Diagnosis not present

## 2023-01-18 DIAGNOSIS — I5033 Acute on chronic diastolic (congestive) heart failure: Secondary | ICD-10-CM | POA: Diagnosis not present

## 2023-01-18 MED ORDER — AMLODIPINE-OLMESARTAN 5-40 MG PO TABS: 1.0000 | ORAL_TABLET | Freq: Every day | ORAL | 3 refills | Status: DC

## 2023-01-18 NOTE — Progress Notes (Signed)
Cardiology Office Note    Date:  01/18/2023  ID:  Emily Jennings, DOB 17-Mar-1934, MRN [id number] Cardiologist: Dina Rich, MD    History of Present Illness:    Emily Jennings is a 89 y.o. female with past medical history of HFpEF, valvular heart disease, HTN, HLD, Stage 3 CKD, Type 2 DM and GERD who presents to the office today for 53-month follow-up.  She was examined by Dr. Wyline Mood in 10/2022 and reported some lower extremity edema but denied any associated dyspnea. She was taking Lasix 40 mg every 2 days. Given that her weight had been stable at 120 - 122 lbs, she was continued on her current regimen.  In talking with the patient today, she reports a variety of medical issues. She is being followed by Endocrinology and reports recent adjustments were made to her insulin and she feels like this is too strong as she is having episodes of hypoglycemia. She plans to call them today to further discuss. She was also evaluated by Nephrology today and given her lower extremity edema, he did recommend that her Lasix be increased from 40 mg daily to 40 mg twice daily and was also recommended to start Spironolactone 12.5 mg daily. She is hesitant about making so many medication adjustments at once. It was also recommended to reduce Amlodipine to 5 mg daily but she is on a combination pill of Amlodipine-Olmesartan. She reports her breathing has been stable and denies any specific dyspnea on exertion, orthopnea or PND. No recent chest pain or palpitations.  Studies Reviewed:   EKG: EKG is not ordered today. EKG from 09/13/2022 is reviewed and shows NSR, HR 96 with no acute ST changes.   Echocardiogram: 08/2022 IMPRESSIONS     1. Left ventricular ejection fraction, by estimation, is 60 to 65%. The  left ventricle has normal function. The left ventricle has no regional  wall motion abnormalities. There is mild left ventricular hypertrophy.  Left ventricular diastolic parameters  are consistent with  Grade II diastolic dysfunction (pseudonormalization).  Elevated left ventricular end-diastolic pressure.   2. Right ventricular systolic function is normal. The right ventricular  size is normal. There is moderately elevated pulmonary artery systolic  pressure.   3. Right atrial size was mildly dilated.   4. 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 The mitral valve is abnormal. Trivial mitral valve  regurgitation. No evidence of mitral stenosis.   5. Higher gradients likely from high CO DVI 0.69 and AVA aas high as 1.95  by VTI suggests not truly moderate AS Acceleration time also fairly normal  . The aortic valve is tricuspid. There is moderate calcification of the  aortic valve. There is moderate  thickening of the aortic valve. Aortic valve regurgitation is not  visualized. Mild aortic valve stenosis.   6. The inferior vena cava is dilated in size with >50% respiratory  variability, suggesting right atrial pressure of 8 mmHg.    Physical Exam:   VS:  BP (!) 140/62   Pulse 80   Ht 5' (1.524 m)   Wt 129 lb (58.5 kg)   SpO2 94%   BMI 25.19 kg/m    Wt Readings from Last 3 Encounters:  01/18/23 129 lb (58.5 kg)  12/25/22 130 lb (59 kg)  11/15/22 119 lb (54 kg)     GEN: Well nourished, well developed female appearing in  no acute distress NECK: No JVD; No carotid bruits CARDIAC: RRR, 2/6 SEM along RUSB.  RESPIRATORY:  Clear to auscultation without rales, wheezing or rhonchi  ABDOMEN: Appears non-distended. No obvious abdominal masses. EXTREMITIES: No clubbing or cyanosis. 1+ pitting edema bilaterally.  Distal pedal pulses are 2+ bilaterally.   Assessment and Plan:   1. Chronic HFpEF - EF was preserved at 60 to 65% by echocardiogram in 08/2022 but she did have Grade 2 DD. She does have lower extremity on examination today and I do agree with Nephrology in regards  to reducing Amlodipine from 10 mg daily to 5 mg daily to see if this will help with her symptoms.  She is hesitant to increase Lasix while also starting Spironolactone, therefore she will increase Lasix to 40 mg twice daily as previously recommend by Nephrology and hold on starting Spironolactone for now. We will obtain a follow-up BMET in 7-10 days for reassessment. If her lower extremity edema improves with reduction of Amlodipine, perhaps she can resume Lasix at her prior dose of 40 mg daily. She remains on Farxiga 10mg  daily.   2. 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 Was also noted to have mild AS. Would plan to arrange for a follow-up echocardiogram at the time of her next visit.  3. HTN - Her blood pressure is at 140/62 during today's visit and she reports this has been well-controlled when checked at home. It was recommended by Nephrology to reduce Amlodipine from 10 mg daily to 5 mg daily to see if this would help with her lower extremity edema but I did confirm with her pharmacy that she is on a combination pill of Amlodipine-Olmesartan. Will reduce Amlodipine-Olmesartan from 10-40 mg daily to 5-40mg  daily. I encouraged her to continue to follow readings at home. Will continue Coreg at current dosing of 12.5 mg twice daily.  4. HLD - LDL was at 65 in 11/2021. She remains on Crestor 5 mg daily.  5. Stage 3 CKD - Baseline creatinine 1.4 - 1.6. Was at 1.85 by recent labs on 01/17/2023. Followed by Dr. Wolfgang Phoenix.   Signed, Ellsworth Lennox, PA-C

## 2023-01-18 NOTE — Patient Instructions (Signed)
Medication Instructions:   Reduce Amlodipine-Olmesartan to 5-40mg  daily as discussed by Nephrology and a New Rx was sent to our pharmacy as you cannot cut your current tablets.    *If you need a refill on your cardiac medications before your next appointment, please call your pharmacy*   Lab Work:  Labs in 1 week.    If you have labs (blood work) drawn today and your tests are completely normal, you will receive your results only by: MyChart Message (if you have MyChart) OR A paper copy in the mail If you have any lab test that is abnormal or we need to change your treatment, we will call you to review the results.    Follow-Up: At [facility], you and your health needs are our priority.  As part of our continuing mission to provide you with exceptional heart care, we have created designated Provider Care Teams.  These Care Teams include your primary Cardiologist (physician) and Advanced Practice Providers (APPs -  Physician Assistants and Nurse Practitioners) who all work together to provide you with the care you need, when you need it.  We recommend signing up for the patient portal called "MyChart".  Sign up information is provided on this After Visit Summary.  MyChart is used to connect with patients for Virtual Visits (Telemedicine).  Patients are able to view lab/test results, encounter notes, upcoming appointments, etc.  Non-urgent messages can be sent to your provider as well.   To learn more about what you can do with MyChart, go to [email or web address].    Your next appointment:   6 month(s)  Provider:   You may see Dina Rich, MD or one of the following Advanced Practice Providers on your designated Care Team:   [place], PA-C  Jacolyn Reedy, [place]

## 2023-01-30 ENCOUNTER — Encounter: Payer: Self-pay | Admitting: Nutrition

## 2023-01-30 ENCOUNTER — Encounter: Payer: Medicare Other | Attending: Family Medicine | Admitting: Nutrition

## 2023-01-30 VITALS — Wt 110.0 lb

## 2023-01-30 DIAGNOSIS — E1121 Type 2 diabetes mellitus with diabetic nephropathy: Secondary | ICD-10-CM | POA: Diagnosis not present

## 2023-01-30 DIAGNOSIS — N1831 Chronic kidney disease, stage 3a: Secondary | ICD-10-CM | POA: Insufficient documentation

## 2023-01-30 DIAGNOSIS — Z794 Long term (current) use of insulin: Secondary | ICD-10-CM | POA: Diagnosis not present

## 2023-01-30 DIAGNOSIS — E1122 Type 2 diabetes mellitus with diabetic chronic kidney disease: Secondary | ICD-10-CM | POA: Diagnosis not present

## 2023-01-30 DIAGNOSIS — I5022 Chronic systolic (congestive) heart failure: Secondary | ICD-10-CM | POA: Insufficient documentation

## 2023-01-30 DIAGNOSIS — N1832 Chronic kidney disease, stage 3b: Secondary | ICD-10-CM | POA: Diagnosis not present

## 2023-01-30 NOTE — Progress Notes (Signed)
Medical Nutrition Therapy  Appointment Start time:  1530  Appointment End time:  1700  Primary concerns today: Type 2 Dm  Referral diagnosis: E11.8 Preferred learning style: No preference  Learning readiness: Ready    NUTRITION ASSESSMENT  89  yr old bfemale here with her husband. Sees Dr. Lonzo Cloud for Endocrinology. PCP Dr. [name]. She notes her FBS this am was 400 mg/dl.   This afternoon it was BS 229 this am. She notes she eats 2-3 meals per day but doesn't eat a lot at one time. Has the G7 to be put on today. I assisted her with getting it put on and she has her reader.  She notes she is fairly active. Is currently taking Fargixa and Humalog 75/25  6-7 units before breakfast and before supper. She notes she had a low blood sugar the other day and fell in the kitchen. Her son and husband helped her and got her something to eat. She has had a few low blood sugars in the past few weeks.  She has CKD and sees Dr. Naaman Plummer.   She is willing to work on eating better balanced whole plant based foods at meal times and avoid skipping mea.s.  Lab Results  Component Value Date   HGBA1C 6.9 (A) 12/25/2022      Latest Ref Rng & Units 09/28/2022    9:56 AM 09/17/2022    5:21 AM 09/16/2022    5:28 AM  CMP  Glucose 70 - 99 mg/dL 161  096  045   BUN 8 - 27 mg/dL 40  26  25   Creatinine 0.57 - 1.00 mg/dL 4.09  8.11  9.14   Sodium 134 - 144 mmol/L 136  138  144   Potassium 3.5 - 5.2 mmol/L 5.1  3.3  3.4   Chloride 96 - 106 mmol/L 96  103  104   CO2 20 - 29 mmol/L 26  27  31    Calcium 8.7 - 10.3 mg/dL 9.5  9.1  8.9      Anthropometrics  Wt Readings from Last 3 Encounters:  01/30/23 110 lb (49.9 kg)  01/18/23 129 lb (58.5 kg)  12/25/22 130 lb (59 kg)   Ht Readings from Last 3 Encounters:  01/18/23 5' (1.524 m)  12/25/22 5' (1.524 m)  11/15/22 5' (1.524 m)   Body mass index is 21.48 kg/m. @BMIFA @ Facility age limit for growth %iles is 20 years. Facility age limit for growth  %iles is 20 years.    Clinical Medical Hx: CHF, DM Type 2 Medications: Farxiga and Humalog 75/25. Labs:  Lab Results  Component Value Date   HGBA1C 6.9 (A) 12/25/2022      Latest Ref Rng & Units 09/28/2022    9:56 AM 09/17/2022    5:21 AM 09/16/2022    5:28 AM  CMP  Glucose 70 - 99 mg/dL 782  956  213   BUN 8 - 27 mg/dL 40  26  25   Creatinine 0.57 - 1.00 mg/dL 0.86  5.78  4.69   Sodium 134 - 144 mmol/L 136  138  144   Potassium 3.5 - 5.2 mmol/L 5.1  3.3  3.4   Chloride 96 - 106 mmol/L 96  103  104   CO2 20 - 29 mmol/L 26  27  31    Calcium 8.7 - 10.3 mg/dL 9.5  9.1  8.9    Lipid Panel     Component Value Date/Time   CHOL 192 11/24/2021 0837  TRIG 43 11/24/2021 0837   HDL 118 11/24/2021 0837   CHOLHDL 1.6 11/24/2021 0837   CHOLHDL 1.8 06/29/2020 0916   VLDL 8 10/31/2016 0845   LDLCALC 65 11/24/2021 0837   LDLCALC 80 06/29/2020 0916   LABVLDL 9 11/24/2021 0837    Notable Signs/Symptoms:Low blood sugars sometimes   Lifestyle & Dietary Hx Lives with husband. She cooks  Estimated daily fluid intake: 30 oz Supplements:  Sleep: good Stress / self-care: none Current average weekly physical activity: ADL and walks some  24-Hr Dietary Recall First Meal: fruit Snack: crackers Second Meal: sometimes vegetables or salad, water Snack:  Third Meal: meat and vegetables,  Snack:  Beverages: water  Estimated Energy Needs Calories: 1500 Carbohydrate: 170g Protein: 112g Fat: 42g   NUTRITION DIAGNOSIS  NB-1.1 Food and nutrition-related knowledge deficit As related to Dm Type 2.  As evidenced by A1C 6.9%..   NUTRITION INTERVENTION  Nutrition education (E-1) on the following topics:  Nutrition and Diabetes education provided on My Plate, CHO counting, meal planning, portion sizes, timing of meals, avoiding snacks between meals unless having a low blood sugar, target ranges for A1C and blood sugars, signs/symptoms and treatment of hyper/hypoglycemia, monitoring blood  sugars, taking medications as prescribed, benefits of exercising 30 minutes per day and prevention of complications of DM.   Lifestyle Medicine  - Whole Food, Plant Predominant Nutrition is highly recommended: Eat Plenty of vegetables, Mushrooms, fruits, Legumes, Whole Grains, Nuts, seeds in lieu of processed meats, processed snacks/pastries red meat, poultry, eggs.    -It is better to avoid simple carbohydrates including: Cakes, Sweet Desserts, Ice Cream, Soda (diet and regular), Sweet Tea, Candies, Chips, Cookies, Store Bought Juices, Alcohol in Excess of  1-2 drinks a day, Lemonade,  Artificial Sweeteners, Doughnuts, Coffee Creamers, "Sugar-free" Products, etc, etc.  This is not a complete list.....  Exercise: If you are able: 30 -60 minutes a day ,4 days a week, or 150 minutes a week.  The longer the better.  Combine stretch, strength, and aerobic activities.  If you were told in the past that you have high risk for cardiovascular diseases, you may seek evaluation by your heart doctor prior to initiating moderate to intense exercise programs.  CGM Training: CGM Dexcom 7    Intervention:   Understanding Glucose Sensing Dexcom 7 Programming Sensor Information  High Glucose: 180 mg/dl  Low Glucose 80 mg/dl  Other settings to be added at follow up visit Starting [employer] of Product  Entering BG, Calibration Technique and Graphs with Public librarian and Alarms   Follow Up Patient to see MD for insulin dose adjustments and to schedule visit with me for CGM follow up within 6  week(s).  Handouts Provided Include  Lifestyle Medicine G7 booklets   Learning Style & Readiness for Change Teaching method utilized: Visual & Auditory  Demonstrated degree of understanding via: Teach Back  Barriers to learning/adherence to lifestyle change: none  Goals Established by Pt Eat three meals per day at times discussed Eat protein and  healthy carbohydrates at each meals;= whole plant based foods Avoid low blood sugars Don't skip meals. Use Dexcom 7 to see how  your blood sugars are doing throughout the day based on what  you're eating and your insulin. Call MD if blood sugars are dropping below 80's or over 200 2-3 times in a row or in a week consistently.    MONITORING & EVALUATION Dietary intake, weekly physical activity,  and blood sugars in 1 month.  Next Steps  Patient is to work on eating better balanced meals.Marland Kitchen

## 2023-01-30 NOTE — Patient Instructions (Signed)
Eat three meals per day at times discussed Eat protein and healthy carbohydrates at each meals;= whole plant based foods Avoid low blood sugars-- BS less than 80 mg/dl. Don't skip meals. Use Dexcom 7 to see how  your blood sugars are doing throughout the day based on what  you're eating and your insulin. Call MD if blood sugars are dropping below 80's or over 200 2-3 times in a row or in a week consistently.

## 2023-01-31 ENCOUNTER — Ambulatory Visit: Payer: Medicare Other | Admitting: Cardiology

## 2023-01-31 DIAGNOSIS — E1142 Type 2 diabetes mellitus with diabetic polyneuropathy: Secondary | ICD-10-CM | POA: Diagnosis not present

## 2023-02-01 DIAGNOSIS — N1832 Chronic kidney disease, stage 3b: Secondary | ICD-10-CM | POA: Diagnosis not present

## 2023-02-01 DIAGNOSIS — Z79899 Other long term (current) drug therapy: Secondary | ICD-10-CM | POA: Diagnosis not present

## 2023-02-02 ENCOUNTER — Telehealth: Payer: Self-pay | Admitting: Cardiology

## 2023-02-02 LAB — BASIC METABOLIC PANEL
BUN/Creatinine Ratio: 15 (calc) (ref 6–22)
BUN: 25 mg/dL (ref 7–25)
CO2: 35 mmol/L — ABNORMAL HIGH (ref 20–32)
Calcium: 8.8 mg/dL (ref 8.6–10.4)
Chloride: 95 mmol/L — ABNORMAL LOW (ref 98–110)
Creat: 1.7 mg/dL — ABNORMAL HIGH (ref 0.60–0.95)
Glucose, Bld: 234 mg/dL — ABNORMAL HIGH (ref 65–139)
Potassium: 3.6 mmol/L (ref 3.5–5.3)
Sodium: 137 mmol/L (ref 135–146)

## 2023-02-02 NOTE — Telephone Encounter (Signed)
Pt returning nurses call regarding results. Please advise 

## 2023-02-02 NOTE — Telephone Encounter (Signed)
Emily Jennings, [place] M, PA-C  [place], Vowin[place] G, LPN Please let the patient know that her kidney function has improved with recent medication adjustments as creatinine was previously elevated to 1.85 on 01/17/2023 and is now down to 1.70.  Sodium and potassium are within a normal range. Please see if her lower extremity edema has improved with medication adjustments and also reducing her dose of Amlodipine.    Lab results discussed with patient. She reports lower extremity edema is now resolved.

## 2023-02-15 ENCOUNTER — Ambulatory Visit (HOSPITAL_COMMUNITY)
Admission: RE | Admit: 2023-02-15 | Discharge: 2023-02-15 | Disposition: A | Discharge status: Home / Self Care | Payer: Medicare Other | Source: Ambulatory Visit | Attending: Family Medicine | Admitting: Family Medicine

## 2023-02-15 ENCOUNTER — Encounter: Payer: Self-pay | Admitting: Family Medicine

## 2023-02-15 ENCOUNTER — Ambulatory Visit (INDEPENDENT_AMBULATORY_CARE_PROVIDER_SITE_OTHER): Payer: Medicare Other | Admitting: Family Medicine

## 2023-02-15 VITALS — BP 152/72 | HR 62 | Ht 60.0 in | Wt 115.0 lb

## 2023-02-15 DIAGNOSIS — M160 Bilateral primary osteoarthritis of hip: Secondary | ICD-10-CM | POA: Diagnosis not present

## 2023-02-15 DIAGNOSIS — M25551 Pain in right hip: Secondary | ICD-10-CM | POA: Insufficient documentation

## 2023-02-15 DIAGNOSIS — Z79899 Other long term (current) drug therapy: Secondary | ICD-10-CM | POA: Diagnosis not present

## 2023-02-15 DIAGNOSIS — I10 Essential (primary) hypertension: Secondary | ICD-10-CM | POA: Diagnosis not present

## 2023-02-15 DIAGNOSIS — Z8349 Family history of other endocrine, nutritional and metabolic diseases: Secondary | ICD-10-CM | POA: Diagnosis not present

## 2023-02-15 DIAGNOSIS — E1065 Type 1 diabetes mellitus with hyperglycemia: Secondary | ICD-10-CM | POA: Diagnosis not present

## 2023-02-15 DIAGNOSIS — M7989 Other specified soft tissue disorders: Secondary | ICD-10-CM | POA: Diagnosis not present

## 2023-02-15 DIAGNOSIS — Z833 Family history of diabetes mellitus: Secondary | ICD-10-CM | POA: Diagnosis not present

## 2023-02-15 DIAGNOSIS — E8809 Other disorders of plasma-protein metabolism, not elsewhere classified: Secondary | ICD-10-CM | POA: Diagnosis not present

## 2023-02-15 DIAGNOSIS — I129 Hypertensive chronic kidney disease with stage 1 through stage 4 chronic kidney disease, or unspecified chronic kidney disease: Secondary | ICD-10-CM | POA: Diagnosis not present

## 2023-02-15 DIAGNOSIS — E782 Mixed hyperlipidemia: Secondary | ICD-10-CM | POA: Diagnosis not present

## 2023-02-15 DIAGNOSIS — W19XXXA Unspecified fall, initial encounter: Secondary | ICD-10-CM | POA: Diagnosis present

## 2023-02-15 DIAGNOSIS — E1142 Type 2 diabetes mellitus with diabetic polyneuropathy: Secondary | ICD-10-CM | POA: Diagnosis not present

## 2023-02-15 DIAGNOSIS — M25559 Pain in unspecified hip: Secondary | ICD-10-CM | POA: Insufficient documentation

## 2023-02-15 DIAGNOSIS — E876 Hypokalemia: Secondary | ICD-10-CM | POA: Diagnosis not present

## 2023-02-15 DIAGNOSIS — Z7983 Long term (current) use of bisphosphonates: Secondary | ICD-10-CM | POA: Diagnosis not present

## 2023-02-15 DIAGNOSIS — Z8249 Family history of ischemic heart disease and other diseases of the circulatory system: Secondary | ICD-10-CM | POA: Diagnosis not present

## 2023-02-15 DIAGNOSIS — Z823 Family history of stroke: Secondary | ICD-10-CM | POA: Diagnosis not present

## 2023-02-15 DIAGNOSIS — I160 Hypertensive urgency: Secondary | ICD-10-CM | POA: Diagnosis not present

## 2023-02-15 DIAGNOSIS — N179 Acute kidney failure, unspecified: Secondary | ICD-10-CM | POA: Diagnosis not present

## 2023-02-15 DIAGNOSIS — R7989 Other specified abnormal findings of blood chemistry: Secondary | ICD-10-CM | POA: Diagnosis not present

## 2023-02-15 DIAGNOSIS — Z825 Family history of asthma and other chronic lower respiratory diseases: Secondary | ICD-10-CM | POA: Diagnosis not present

## 2023-02-15 DIAGNOSIS — Z7982 Long term (current) use of aspirin: Secondary | ICD-10-CM | POA: Diagnosis not present

## 2023-02-15 DIAGNOSIS — E860 Dehydration: Secondary | ICD-10-CM | POA: Diagnosis not present

## 2023-02-15 DIAGNOSIS — E46 Unspecified protein-calorie malnutrition: Secondary | ICD-10-CM | POA: Diagnosis not present

## 2023-02-15 DIAGNOSIS — I2510 Atherosclerotic heart disease of native coronary artery without angina pectoris: Secondary | ICD-10-CM | POA: Diagnosis not present

## 2023-02-15 DIAGNOSIS — Z7984 Long term (current) use of oral hypoglycemic drugs: Secondary | ICD-10-CM | POA: Diagnosis not present

## 2023-02-15 DIAGNOSIS — E1010 Type 1 diabetes mellitus with ketoacidosis without coma: Secondary | ICD-10-CM | POA: Diagnosis not present

## 2023-02-15 DIAGNOSIS — E8729 Other acidosis: Secondary | ICD-10-CM | POA: Diagnosis not present

## 2023-02-15 DIAGNOSIS — E1122 Type 2 diabetes mellitus with diabetic chronic kidney disease: Secondary | ICD-10-CM | POA: Diagnosis not present

## 2023-02-15 DIAGNOSIS — N189 Chronic kidney disease, unspecified: Secondary | ICD-10-CM | POA: Diagnosis not present

## 2023-02-15 DIAGNOSIS — K219 Gastro-esophageal reflux disease without esophagitis: Secondary | ICD-10-CM | POA: Diagnosis not present

## 2023-02-15 DIAGNOSIS — N1330 Unspecified hydronephrosis: Secondary | ICD-10-CM | POA: Diagnosis not present

## 2023-02-15 DIAGNOSIS — M810 Age-related osteoporosis without current pathological fracture: Secondary | ICD-10-CM | POA: Diagnosis not present

## 2023-02-15 DIAGNOSIS — E1110 Type 2 diabetes mellitus with ketoacidosis without coma: Secondary | ICD-10-CM | POA: Diagnosis not present

## 2023-02-15 DIAGNOSIS — I6521 Occlusion and stenosis of right carotid artery: Secondary | ICD-10-CM | POA: Diagnosis not present

## 2023-02-15 DIAGNOSIS — E785 Hyperlipidemia, unspecified: Secondary | ICD-10-CM | POA: Diagnosis not present

## 2023-02-15 DIAGNOSIS — N184 Chronic kidney disease, stage 4 (severe): Secondary | ICD-10-CM | POA: Diagnosis not present

## 2023-02-15 DIAGNOSIS — Z794 Long term (current) use of insulin: Secondary | ICD-10-CM | POA: Diagnosis not present

## 2023-02-15 DIAGNOSIS — Z888 Allergy status to other drugs, medicaments and biological substances status: Secondary | ICD-10-CM | POA: Diagnosis not present

## 2023-02-15 NOTE — Progress Notes (Signed)
Patient Office Visit   Subjective   Patient ID: Emily Jennings, female    DOB: 1934-06-18  Age: 89 y.o. MRN: [id number]  CC:  Chief Complaint  Patient presents with   Hip Injury    Patient states she hit her R hip on a fridge door. Says her sugar was low and she felt like she passed out, does not know if she fell or not.     HPI Emily Jennings 89 year old female, presents to the clinic for right hip pain S/P fall She  has a past medical history of Allergy, Anxiety, ANXIETY DISORDER, GENERALIZED (07/15/2007), Arthritis, Cancer (HCC) (2009), Carcinoma in situ of breast (05/21/2008), Carotid stenosis, Cataract, Complication of anesthesia, Coronary artery disease, Diabetes mellitus, GERD (gastroesophageal reflux disease), Hernia, inguinal, Hyperglycemia, Hypertension, Insomnia (11/16/2011), Low blood potassium, Non-insulin dependent type 2 diabetes mellitus (HCC), Osteoporosis, Shortness of breath, Thickened endometrium (10/26/2017), and Ventricular tachycardia, non-sustained (HCC).  Hip Pain  The injury mechanism was a direct blow and a fall 2 weeks ago. The pain is present in the right hip and right thigh. The quality of the pain is described as aching. The pain is at a severity of 4/10. The pain has been fluctuating since onset. Associated symptoms include muscle weakness. Pertinent negatives include no inability to bear weight, loss of motion, numbness or tingling. The symptoms are aggravated by movement. She has tried acetaminophen for the symptoms. The treatment provided moderate relief.        Outpatient Encounter Medications as of 02/15/2023  Medication Sig   alendronate (FOSAMAX) 70 MG tablet TAKE 1 TABLET BY MOUTH EVERY 7 DAYS. TAKE WITH A FULL GLASS OF WATER ON AN EMPTY STOMACH   amLODipine-olmesartan (AZOR) 5-40 MG tablet Take 1 tablet by mouth daily.   aspirin EC 81 MG tablet Take 1 tablet (81 mg total) by mouth daily with breakfast.   blood glucose meter kit and supplies KIT One touch  Ultra. Use three times daily as directed. (FOR ICD E11.65)   calcitRIOL (ROCALTROL) 0.25 MCG capsule Take 0.25 mcg by mouth 3 (three) times a week.   carvedilol (COREG) 12.5 MG tablet Take 12.5 mg by mouth 2 (two) times daily with a meal.   Continuous Blood Gluc Sensor (DEXCOM G7 SENSOR) MISC 1 Device by Does not apply route as directed.   FARXIGA 10 MG TABS tablet Take 10 mg by mouth every morning.   furosemide (LASIX) 40 MG tablet Take 1 tablet (40 mg total) by mouth 2 (two) times daily.   glucose blood (ONETOUCH ULTRA) test strip USE TO TEST 4 TIMES A DAY   Insulin Lispro Prot & Lispro (HUMALOG MIX 75/25 KWIKPEN) (75-25) 100 UNIT/ML Kwikpen Inject 10 Units into the skin daily before breakfast AND 12 Units daily before supper.   Insulin Pen Needle 32G X 4 MM MISC 1 each by Does not apply route in the morning and at bedtime.   OneTouch Delica Lancets 33G MISC 1 each by Does not apply route 3 (three) times daily. Use to check glucose three times daily   Potassium Chloride ER 20 MEQ TBCR Take 1 tablet (20 mEq total) by mouth daily. 1 tab daily by mouth--- take while taking Lasix/furosemide   rosuvastatin (CRESTOR) 5 MG tablet TAKE 1 TABLET (5 MG TOTAL) BY MOUTH DAILY.   spironolactone (ALDACTONE) 50 MG tablet Take 50 mg by mouth daily.   No facility-administered encounter medications on file as of 02/15/2023.    Past Surgical History:  Procedure Laterality Date   BREAST LUMPECTOMY Left 2009   Left breast 2009   CATARACT EXTRACTION W/PHACO Left 10/28/2014   Procedure: PHACO EMULSION CATARACT EXTRACTION WITH INTRAOCULAR LENS IMPLANT LEFT EYE (IOC);  Surgeon: Emily Guest, MD;  Location: [facility] OR;  Service: Ophthalmology;  Laterality: Left;   COLONOSCOPY     cyst removed from left foot     REFRACTIVE SURGERY Left     Review of Systems  Constitutional:  Negative for chills and fever.  Cardiovascular:  Negative for chest pain.  Genitourinary:  Negative for dysuria.  Musculoskeletal:  Negative  for myalgias.  Psychiatric/Behavioral:  Negative for depression.       Objective    BP (!) 152/72   Pulse 62   Ht 5' (1.524 m)   Wt 115 lb (52.2 kg)   SpO2 (!) 89%   BMI 22.46 kg/m   Physical Exam Vitals reviewed.  Constitutional:      General: She is not in acute distress.    Appearance: Normal appearance. She is not ill-appearing, toxic-appearing or diaphoretic.  HENT:     Head: Normocephalic.  Eyes:     General:        Right eye: No discharge.        Left eye: No discharge.     Conjunctiva/sclera: Conjunctivae normal.  Cardiovascular:     Rate and Rhythm: Normal rate.     Pulses: Normal pulses.     Heart sounds: Normal heart sounds.  Pulmonary:     Effort: Pulmonary effort is normal. No respiratory distress.     Breath sounds: Normal breath sounds.  Abdominal:     General: Bowel sounds are normal.     Palpations: Abdomen is soft.     Tenderness: There is no abdominal tenderness. There is no guarding.  Musculoskeletal:        General: Swelling present.     Cervical back: Normal range of motion.     Right hip: No tenderness. Decreased range of motion. Decreased strength.  Skin:    General: Skin is warm and dry.     Capillary Refill: Capillary refill takes less than 2 seconds.  Neurological:     General: No focal deficit present.     Mental Status: She is alert and oriented to person, place, and time.     Coordination: Coordination normal.     Gait: Gait normal.  Psychiatric:        Mood and Affect: Mood normal.        Behavior: Behavior normal.       Assessment & Plan:  Right hip pain -     DG HIP UNILAT W OR W/O PELVIS 2-3 VIEWS RIGHT; Future  Pain of right hip Assessment & Plan: S/P Fall two weeks ago Large knot noted on right hip Advise patient to take tylenol for pain Xray ordered awaiting results will follow up     Return if symptoms worsen or fail to improve.   Cruzita Lederer Newman Nip, FNP

## 2023-02-15 NOTE — Assessment & Plan Note (Signed)
S/P Fall two weeks ago Large knot noted on right hip Advise patient to take tylenol for pain Xray ordered awaiting results will follow up

## 2023-02-15 NOTE — Patient Instructions (Signed)
        Great to see you today.   - Please take medications as prescribed. - Follow up with your primary health provider if any health concerns arises. - If symptoms worsen please contact your primary care provider and/or visit the emergency department.  

## 2023-02-16 ENCOUNTER — Encounter: Payer: Self-pay | Admitting: Internal Medicine

## 2023-02-16 ENCOUNTER — Other Ambulatory Visit: Payer: Self-pay | Admitting: Internal Medicine

## 2023-02-16 ENCOUNTER — Ambulatory Visit: Payer: Medicare Other | Admitting: Internal Medicine

## 2023-02-16 ENCOUNTER — Inpatient Hospital Stay (HOSPITAL_COMMUNITY)
Admission: EM | Admit: 2023-02-16 | Discharge: 2023-02-21 | DRG: 638 | Disposition: A | Discharge status: Home / Self Care | Payer: Medicare Other | Attending: Internal Medicine | Admitting: Internal Medicine

## 2023-02-16 VITALS — BP 140/76 | HR 55 | Ht 60.0 in | Wt 116.0 lb

## 2023-02-16 DIAGNOSIS — Z800 Family history of malignant neoplasm of digestive organs: Secondary | ICD-10-CM

## 2023-02-16 DIAGNOSIS — E46 Unspecified protein-calorie malnutrition: Secondary | ICD-10-CM | POA: Insufficient documentation

## 2023-02-16 DIAGNOSIS — N184 Chronic kidney disease, stage 4 (severe): Secondary | ICD-10-CM | POA: Diagnosis not present

## 2023-02-16 DIAGNOSIS — N189 Chronic kidney disease, unspecified: Secondary | ICD-10-CM | POA: Insufficient documentation

## 2023-02-16 DIAGNOSIS — Z794 Long term (current) use of insulin: Secondary | ICD-10-CM

## 2023-02-16 DIAGNOSIS — I6521 Occlusion and stenosis of right carotid artery: Secondary | ICD-10-CM | POA: Diagnosis present

## 2023-02-16 DIAGNOSIS — I1A0 Resistant hypertension: Secondary | ICD-10-CM | POA: Diagnosis present

## 2023-02-16 DIAGNOSIS — Z7982 Long term (current) use of aspirin: Secondary | ICD-10-CM

## 2023-02-16 DIAGNOSIS — E1122 Type 2 diabetes mellitus with diabetic chronic kidney disease: Secondary | ICD-10-CM | POA: Diagnosis present

## 2023-02-16 DIAGNOSIS — E1142 Type 2 diabetes mellitus with diabetic polyneuropathy: Secondary | ICD-10-CM | POA: Diagnosis not present

## 2023-02-16 DIAGNOSIS — Z833 Family history of diabetes mellitus: Secondary | ICD-10-CM

## 2023-02-16 DIAGNOSIS — Z8349 Family history of other endocrine, nutritional and metabolic diseases: Secondary | ICD-10-CM

## 2023-02-16 DIAGNOSIS — Z823 Family history of stroke: Secondary | ICD-10-CM

## 2023-02-16 DIAGNOSIS — E876 Hypokalemia: Secondary | ICD-10-CM | POA: Diagnosis present

## 2023-02-16 DIAGNOSIS — E8729 Other acidosis: Secondary | ICD-10-CM | POA: Insufficient documentation

## 2023-02-16 DIAGNOSIS — E1110 Type 2 diabetes mellitus with ketoacidosis without coma: Principal | ICD-10-CM | POA: Diagnosis present

## 2023-02-16 DIAGNOSIS — E1010 Type 1 diabetes mellitus with ketoacidosis without coma: Principal | ICD-10-CM

## 2023-02-16 DIAGNOSIS — E785 Hyperlipidemia, unspecified: Secondary | ICD-10-CM | POA: Diagnosis present

## 2023-02-16 DIAGNOSIS — I10 Essential (primary) hypertension: Secondary | ICD-10-CM | POA: Diagnosis present

## 2023-02-16 DIAGNOSIS — Z7984 Long term (current) use of oral hypoglycemic drugs: Secondary | ICD-10-CM

## 2023-02-16 DIAGNOSIS — I2510 Atherosclerotic heart disease of native coronary artery without angina pectoris: Secondary | ICD-10-CM | POA: Diagnosis present

## 2023-02-16 DIAGNOSIS — Z825 Family history of asthma and other chronic lower respiratory diseases: Secondary | ICD-10-CM

## 2023-02-16 DIAGNOSIS — E782 Mixed hyperlipidemia: Secondary | ICD-10-CM | POA: Diagnosis present

## 2023-02-16 DIAGNOSIS — Z79899 Other long term (current) drug therapy: Secondary | ICD-10-CM

## 2023-02-16 DIAGNOSIS — I129 Hypertensive chronic kidney disease with stage 1 through stage 4 chronic kidney disease, or unspecified chronic kidney disease: Secondary | ICD-10-CM | POA: Diagnosis present

## 2023-02-16 DIAGNOSIS — W19XXXA Unspecified fall, initial encounter: Secondary | ICD-10-CM | POA: Diagnosis present

## 2023-02-16 DIAGNOSIS — Z888 Allergy status to other drugs, medicaments and biological substances status: Secondary | ICD-10-CM

## 2023-02-16 DIAGNOSIS — Z8249 Family history of ischemic heart disease and other diseases of the circulatory system: Secondary | ICD-10-CM

## 2023-02-16 DIAGNOSIS — M810 Age-related osteoporosis without current pathological fracture: Secondary | ICD-10-CM | POA: Diagnosis present

## 2023-02-16 DIAGNOSIS — Z7983 Long term (current) use of bisphosphonates: Secondary | ICD-10-CM

## 2023-02-16 DIAGNOSIS — R7989 Other specified abnormal findings of blood chemistry: Secondary | ICD-10-CM | POA: Insufficient documentation

## 2023-02-16 DIAGNOSIS — E8809 Other disorders of plasma-protein metabolism, not elsewhere classified: Secondary | ICD-10-CM | POA: Insufficient documentation

## 2023-02-16 DIAGNOSIS — E860 Dehydration: Secondary | ICD-10-CM | POA: Insufficient documentation

## 2023-02-16 DIAGNOSIS — K219 Gastro-esophageal reflux disease without esophagitis: Secondary | ICD-10-CM | POA: Diagnosis present

## 2023-02-16 DIAGNOSIS — S7001XA Contusion of right hip, initial encounter: Secondary | ICD-10-CM | POA: Diagnosis present

## 2023-02-16 DIAGNOSIS — N179 Acute kidney failure, unspecified: Secondary | ICD-10-CM | POA: Diagnosis present

## 2023-02-16 DIAGNOSIS — I160 Hypertensive urgency: Secondary | ICD-10-CM | POA: Diagnosis present

## 2023-02-16 MED ORDER — LANTUS SOLOSTAR 100 UNIT/ML ~~LOC~~ SOPN: 12.0000 [IU] | PEN_INJECTOR | Freq: Every day | SUBCUTANEOUS | 3 refills | Status: DC

## 2023-02-16 NOTE — Progress Notes (Signed)
Name: Emily Jennings  MRN/ DOB: [id number], 12/17/1933   Age/ Sex: 89 y.o., female    PCP: [name], MD   Reason for Endocrinology Evaluation: Type 2 Diabetes Mellitus     Date of Initial Endocrinology Visit: 12/25/2022    PATIENT IDENTIFIER: [name] is a 89 y.o. female with a past medical history of DM, anxiety, Hx breast CA. The patient presented for initial endocrinology clinic visit on 12/25/2022 for consultative assistance with her diabetes management.    HPI: Emily Jennings was    Diagnosed with DM at age 6 Prior Medications tried/Intolerance: Metformin-weight loss Currently checking blood sugars 3 x / day,  before breakfast Hypoglycemia episodes : yes              Hemoglobin A1c has ranged from 6.8% in 2022, peaking at 9.2% in 2016.   Transferred care from Dr. Isidoro Donning office   Pt with fear of hypoglycemia  She was started on Farxiga by her nephrologist 2024  SUBJECTIVE:   During the last visit (12/25/2022): A1c 6.9%  Today (02/16/23): Emily Jennings is here for follow-up on diabetes management. She  checks her blood sugars multiple times daily. The patient has  had hypoglycemic episodes since the last clinic visit, pt is symptomatic with these episodes.  She had a fall couple weeks ago, was seen by her PCP yesterday and a hip x-ray was ordered  She is on farxiga through nephrology  She has occasional nausea but no vomiting  Has occaional diarrhea with constipaiton    HOME DIABETES REGIMEN: Humalog 75/25  6 units BID    Statin: yes ACE-I/ARB: no    CONTINUOUS GLUCOSE MONITORING RECORD INTERPRETATION    Dates of Recording: 5/17-5/30/2024  Sensor description:dexcom  Results statistics:   CGM use % of time 100  Average and SD 206/79  Time in range      39  %  % Time Above 180 34  % Time above 250 26  % Time Below target <1   Glycemic patterns summary: Bg's trend down at night and increase during the day   Hyperglycemic episodes   postprandial   Hypoglycemic episodes occurred after a bolus   Overnight periods: variable     DIABETIC COMPLICATIONS: Microvascular complications:  CKD IV Denies:  Last eye exam: Completed 2023  Macrovascular complications:   Denies: CAD, PVD, CVA   PAST HISTORY: Past Medical History:  Past Medical History:  Diagnosis Date   Allergy    Anxiety    ANXIETY DISORDER, GENERALIZED 07/15/2007   Qualifier: Diagnosis of  By: Chipper Herb     Arthritis    Cancer [facility]) 2009   breast, carcinoma in situ left   Carcinoma in situ of breast 05/21/2008   Qualifier: Diagnosis of  By: [name] MD, Margaret  Diagnosed in 2009, completed 5 year course of tamoxifen, no evidence of recurrence    Carotid stenosis    11/16/2005  mild plaque formation and stenosis proximal right ECA   Cataract    Complication of anesthesia    Coronary artery disease    cardiac catheterization on 03/20/2006  LAD mid 40% stenosis, left circumflex mild 40% stenosis, RCA mid-vessel 40% to 50% lesion   EF 60%   Diabetes mellitus    GERD (gastroesophageal reflux disease)    Hernia, inguinal    left   Hyperglycemia    Hypertension    Insomnia 11/16/2011   Low blood potassium    Non-insulin dependent type 2  diabetes mellitus (HCC)    Osteoporosis    Shortness of breath    2D Echocardiogram 01/26/2009   EF of greater than 55%, mild MR, mild TR, normal ventricular function   Thickened endometrium 10/26/2017   Noted by gyne in 2017, missed 6 month follow up, referred in 09/2017   Ventricular tachycardia, non-sustained (HCC)    developed during stress test 02/08/2006, spontaneously aborted, mild reversible apical defect   Past Surgical History:  Past Surgical History:  Procedure Laterality Date   BREAST LUMPECTOMY Left 2009   Left breast 2009   CATARACT EXTRACTION W/PHACO Left 10/28/2014   Procedure: PHACO EMULSION CATARACT EXTRACTION WITH INTRAOCULAR LENS IMPLANT LEFT EYE (IOC);  Surgeon: Chalmers Guest, MD;   Location: [facility] OR;  Service: Ophthalmology;  Laterality: Left;   COLONOSCOPY     cyst removed from left foot     REFRACTIVE SURGERY Left     Social History:  reports that she has never smoked. She has never been exposed to tobacco smoke. She has never used smokeless tobacco. She reports that she does not drink alcohol and does not use drugs. Family History:  Family History  Problem Relation Age of Onset   Hypertension Mother    Hyperlipidemia Mother    Stroke Mother    Urticaria Mother    Cancer Father        pancreatic   Colon cancer Father    Heart disease Brother 40       bypass   Heart disease Brother 68       bypass   Arthritis Other    Asthma Other    Diabetes Other    Colon cancer Paternal Aunt    Esophageal cancer Neg Hx    Stomach cancer Neg Hx    Rectal cancer Neg Hx      HOME MEDICATIONS: Allergies as of 02/16/2023       Reactions   Benadryl [diphenhydramine Hcl] Hypertension   Citalopram Other (See Comments)   unknown   Metformin And Related Diarrhea   Lost appetite and weight    Tramadol Other (See Comments)   Felt light headed and dizzy   Crestor [rosuvastatin] Other (See Comments)   "Feet swelling", makes her feel weak        Medication List        Accurate as of Feb 16, 2023  2:14 PM. If you have any questions, ask your nurse or doctor.          STOP taking these medications    Insulin Lispro Prot & Lispro (75-25) 100 UNIT/ML Kwikpen Commonly known as: HumaLOG Mix 75/25 KwikPen Stopped by: Scarlette Shorts, MD       TAKE these medications    alendronate 70 MG tablet Commonly known as: FOSAMAX TAKE 1 TABLET BY MOUTH EVERY 7 DAYS. TAKE WITH A FULL GLASS OF WATER ON AN EMPTY STOMACH   amLODipine-olmesartan 5-40 MG tablet Commonly known as: Azor Take 1 tablet by mouth daily.   aspirin EC 81 MG tablet Take 1 tablet (81 mg total) by mouth daily with breakfast.   blood glucose meter kit and supplies Kit One touch Ultra. Use  three times daily as directed. (FOR ICD E11.65)   calcitRIOL 0.25 MCG capsule Commonly known as: ROCALTROL Take 0.25 mcg by mouth 3 (three) times a week.   carvedilol 12.5 MG tablet Commonly known as: COREG Take 12.5 mg by mouth 2 (two) times daily with a meal.   Dexcom G7 Sensor Misc  1 Device by Does not apply route as directed.   Farxiga 10 MG Tabs tablet Generic drug: dapagliflozin propanediol Take 10 mg by mouth every morning.   furosemide 40 MG tablet Commonly known as: LASIX Take 1 tablet (40 mg total) by mouth 2 (two) times daily.   Insulin Pen Needle 32G X 4 MM Misc 1 each by Does not apply route in the morning and at bedtime.   Lantus SoloStar 100 UNIT/ML Solostar Pen Generic drug: insulin glargine Inject 12 Units into the skin daily. Started by: Scarlette Shorts, MD   OneTouch Delica Lancets 33G Misc 1 each by Does not apply route 3 (three) times daily. Use to check glucose three times daily   OneTouch Ultra test strip Generic drug: glucose blood USE TO TEST 4 TIMES A DAY   Potassium Chloride ER 20 MEQ Tbcr Take 1 tablet (20 mEq total) by mouth daily. 1 tab daily by mouth--- take while taking Lasix/furosemide   rosuvastatin 5 MG tablet Commonly known as: CRESTOR TAKE 1 TABLET (5 MG TOTAL) BY MOUTH DAILY.   spironolactone 50 MG tablet Commonly known as: ALDACTONE Take 50 mg by mouth daily.         ALLERGIES: Allergies  Allergen Reactions   Benadryl [Diphenhydramine Hcl] Hypertension   Citalopram Other (See Comments)    unknown   Metformin And Related Diarrhea    Lost appetite and weight    Tramadol Other (See Comments)    Felt light headed and dizzy   Crestor [Rosuvastatin] Other (See Comments)    "Feet swelling", makes her feel weak     REVIEW OF SYSTEMS: A comprehensive ROS was conducted with the patient and is negative except as per HPI    OBJECTIVE:   VITAL SIGNS: BP (!) 140/76 (BP Location: Right Arm, Patient Position:  Sitting, Cuff Size: Small)   Pulse (!) 55   Ht 5' (1.524 m)   Wt 116 lb (52.6 kg)   SpO2 94%   BMI 22.65 kg/m    PHYSICAL EXAM:  General: Pt appears well and is in NAD  Lungs: Clear with good BS bilat   Heart: RRR   Abdomen:  soft, nontender  Extremities:  Lower extremities - Trace  pretibial edema.   Neuro: MS is good with appropriate affect, pt is alert and Ox3    DM Foot Exam 02/16/2023  The skin of the feet is intact without sores or ulcerations. The pedal pulses are 2+ on right and 2+ on left. The sensation is decreased  to a screening 5.07, 10 gram monofilament bilaterally   DATA REVIEWED:  Lab Results  Component Value Date   HGBA1C 6.9 (A) 12/25/2022   HGBA1C 7.4 (A) 07/06/2022   HGBA1C 7.2 (H) 03/22/2022    ASSESSMENT / PLAN / RECOMMENDATIONS:   1) Type 2 Diabetes Mellitus, Optimally controlled, With neuropathic and CKD IV complications - Most recent A1c of 6.9 %. Goal A1c < 7.5 %.    - Pt with hypoglycemia but also she has fear of it which makes it difficult to optimize glucose  - Given advance age , BG goal is 90-200 mg/dL  - She was started on Farxiga through nephrology which I suspect  has improved her insulin sensitivity, hence increased hypoglycemia  - Given the lack of flexibility with insulin mix, I will switch her to basal insulin and see if she needs add on therapy in the future or the basal /farxiga may be sufficient   MEDICATIONS: Stop  Humalog  mix  Start Toujeo 12 units QAM  Continue Farxiga 10 mg per nephrology  EDUCATION / INSTRUCTIONS: BG monitoring instructions: Patient is instructed to check her blood sugars 3 times a day. Call [facility] if: BG persistently < 70  I reviewed the Rule of 15 for the treatment of hypoglycemia in detail with the patient. Literature supplied.   2) Diabetic complications:  Eye: Does not have known diabetic retinopathy.  Neuro/ Feet: Does  have known diabetic peripheral neuropathy. Renal:  Patient does  have known baseline CKD. She is not on an ACEI/ARB at present.    Follow-up in 2 weeks   Signed electronically by: Lyndle Herrlich, MD  [facility] Endocrinology  [facility] [address] [place] [place], [place] [address] Phone: [phone] FAX: [phone]   CC: [name], MD [address], [address] [place] [address] Phone: [phone]  Fax: [phone]    Return to Endocrinology clinic as below: Future Appointments  Date Time Provider Department Center  02/27/2023  1:00 PM [name], MD [facility]  03/27/2023  3:00 PM Mare Loan, RD [facility] None  05/01/2023  2:00 PM Alphonsine Minium, Konrad Dolores, MD [facility] None  12/03/2023  9:30 AM RPC-ANNUAL WELLNESS VISIT [facility] R[facility]

## 2023-02-16 NOTE — Patient Instructions (Signed)
STOP Humalog MIX  Start Lantus 12 units ONCE daily EVERY Morning     HOW TO TREAT LOW BLOOD SUGARS (Blood sugar LESS THAN 70 MG/DL) Please follow the RULE OF 15 for the treatment of hypoglycemia treatment (when your (blood sugars are less than 70 mg/dL)   STEP 1: Take 15 grams of carbohydrates when your blood sugar is low, which includes:  3-4 GLUCOSE TABS  OR 3-4 OZ OF JUICE OR REGULAR SODA OR ONE TUBE OF GLUCOSE GEL    STEP 2: RECHECK blood sugar in 15 MINUTES STEP 3: If your blood sugar is still low at the 15 minute recheck --> then, go back to STEP 1 and treat AGAIN with another 15 grams of carbohydrates.

## 2023-02-17 ENCOUNTER — Encounter (HOSPITAL_COMMUNITY): Payer: Self-pay

## 2023-02-17 ENCOUNTER — Other Ambulatory Visit: Payer: Self-pay

## 2023-02-17 DIAGNOSIS — N179 Acute kidney failure, unspecified: Secondary | ICD-10-CM | POA: Diagnosis not present

## 2023-02-17 DIAGNOSIS — E46 Unspecified protein-calorie malnutrition: Secondary | ICD-10-CM | POA: Diagnosis not present

## 2023-02-17 DIAGNOSIS — E860 Dehydration: Secondary | ICD-10-CM | POA: Insufficient documentation

## 2023-02-17 DIAGNOSIS — I10 Essential (primary) hypertension: Secondary | ICD-10-CM

## 2023-02-17 DIAGNOSIS — E8809 Other disorders of plasma-protein metabolism, not elsewhere classified: Secondary | ICD-10-CM | POA: Diagnosis not present

## 2023-02-17 DIAGNOSIS — R7989 Other specified abnormal findings of blood chemistry: Secondary | ICD-10-CM

## 2023-02-17 DIAGNOSIS — E1065 Type 1 diabetes mellitus with hyperglycemia: Secondary | ICD-10-CM | POA: Diagnosis not present

## 2023-02-17 DIAGNOSIS — E876 Hypokalemia: Secondary | ICD-10-CM | POA: Diagnosis not present

## 2023-02-17 DIAGNOSIS — E782 Mixed hyperlipidemia: Secondary | ICD-10-CM | POA: Diagnosis not present

## 2023-02-17 DIAGNOSIS — N189 Chronic kidney disease, unspecified: Secondary | ICD-10-CM

## 2023-02-17 DIAGNOSIS — E1110 Type 2 diabetes mellitus with ketoacidosis without coma: Secondary | ICD-10-CM | POA: Diagnosis present

## 2023-02-17 DIAGNOSIS — E8729 Other acidosis: Secondary | ICD-10-CM | POA: Diagnosis not present

## 2023-02-17 LAB — COMPREHENSIVE METABOLIC PANEL
ALT: 29 U/L (ref 0–44)
AST: 49 U/L — ABNORMAL HIGH (ref 15–41)
Albumin: 3.3 g/dL — ABNORMAL LOW (ref 3.5–5.0)
Alkaline Phosphatase: 90 U/L (ref 38–126)
Anion gap: 31 — ABNORMAL HIGH (ref 5–15)
BUN: 47 mg/dL — ABNORMAL HIGH (ref 8–23)
CO2: 16 mmol/L — ABNORMAL LOW (ref 22–32)
Calcium: 8.7 mg/dL — ABNORMAL LOW (ref 8.9–10.3)
Chloride: 82 mmol/L — ABNORMAL LOW (ref 98–111)
Creatinine, Ser: 2.43 mg/dL — ABNORMAL HIGH (ref 0.44–1.00)
GFR, Estimated: 19 mL/min — ABNORMAL LOW (ref >60)
Glucose, Bld: 544 mg/dL (ref 70–99)
Potassium: 3 mmol/L — ABNORMAL LOW (ref 3.5–5.1)
Sodium: 129 mmol/L — ABNORMAL LOW (ref 135–145)
Total Bilirubin: 1.9 mg/dL — ABNORMAL HIGH (ref 0.3–1.2)
Total Protein: 6.2 g/dL — ABNORMAL LOW (ref 6.5–8.1)

## 2023-02-17 LAB — BLOOD GAS, VENOUS
Acid-base deficit: 8.9 mmol/L — ABNORMAL HIGH (ref 0.0–2.0)
Bicarbonate: 16.8 mmol/L — ABNORMAL LOW (ref 20.0–28.0)
Drawn by: 7049
FIO2: 21 %
O2 Saturation: 83 %
Patient temperature: 36.9
pCO2, Ven: 35 mmHg — ABNORMAL LOW (ref 44–60)
pH, Ven: 7.29 (ref 7.25–7.43)
pO2, Ven: 55 mmHg — ABNORMAL HIGH (ref 32–45)

## 2023-02-17 LAB — CBC WITH DIFFERENTIAL/PLATELET
Abs Immature Granulocytes: 0.06 10*3/uL (ref 0.00–0.07)
Basophils Absolute: 0 10*3/uL (ref 0.0–0.1)
Basophils Relative: 0 %
Eosinophils Absolute: 0 10*3/uL (ref 0.0–0.5)
Eosinophils Relative: 0 %
HCT: 37.2 % (ref 36.0–46.0)
Hemoglobin: 12.2 g/dL (ref 12.0–15.0)
Immature Granulocytes: 1 %
Lymphocytes Relative: 9 %
Lymphs Abs: 0.9 10*3/uL (ref 0.7–4.0)
MCH: 30.3 pg (ref 26.0–34.0)
MCHC: 32.8 g/dL (ref 30.0–36.0)
MCV: 92.5 fL (ref 80.0–100.0)
Monocytes Absolute: 0.4 10*3/uL (ref 0.1–1.0)
Monocytes Relative: 4 %
Neutro Abs: 8.1 10*3/uL — ABNORMAL HIGH (ref 1.7–7.7)
Neutrophils Relative %: 86 %
Platelets: 242 10*3/uL (ref 150–400)
RBC: 4.02 MIL/uL (ref 3.87–5.11)
RDW: 13.5 % (ref 11.5–15.5)
WBC: 9.5 10*3/uL (ref 4.0–10.5)
nRBC: 0 % (ref 0.0–0.2)

## 2023-02-17 LAB — CBG MONITORING, ED
Glucose-Capillary: 488 mg/dL — ABNORMAL HIGH (ref 70–99)
Glucose-Capillary: 525 mg/dL (ref 70–99)
Glucose-Capillary: 527 mg/dL (ref 70–99)

## 2023-02-17 LAB — BASIC METABOLIC PANEL
Anion gap: 17 — ABNORMAL HIGH (ref 5–15)
Anion gap: 14 (ref 5–15)
BUN: 48 mg/dL — ABNORMAL HIGH (ref 8–23)
BUN: 50 mg/dL — ABNORMAL HIGH (ref 8–23)
CO2: 23 mmol/L (ref 22–32)
CO2: 26 mmol/L (ref 22–32)
Calcium: 7.9 mg/dL — ABNORMAL LOW (ref 8.9–10.3)
Calcium: 8 mg/dL — ABNORMAL LOW (ref 8.9–10.3)
Chloride: 91 mmol/L — ABNORMAL LOW (ref 98–111)
Chloride: 91 mmol/L — ABNORMAL LOW (ref 98–111)
Creatinine, Ser: 2.24 mg/dL — ABNORMAL HIGH (ref 0.44–1.00)
Creatinine, Ser: 2.27 mg/dL — ABNORMAL HIGH (ref 0.44–1.00)
GFR, Estimated: 21 mL/min — ABNORMAL LOW (ref >60)
GFR, Estimated: 21 mL/min — ABNORMAL LOW (ref >60)
Glucose, Bld: 365 mg/dL — ABNORMAL HIGH (ref 70–99)
Glucose, Bld: 262 mg/dL — ABNORMAL HIGH (ref 70–99)
Potassium: 2.3 mmol/L — CL (ref 3.5–5.1)
Potassium: 3 mmol/L — ABNORMAL LOW (ref 3.5–5.1)
Sodium: 131 mmol/L — ABNORMAL LOW (ref 135–145)
Sodium: 131 mmol/L — ABNORMAL LOW (ref 135–145)

## 2023-02-17 LAB — GLUCOSE, CAPILLARY
Glucose-Capillary: 128 mg/dL — ABNORMAL HIGH (ref 70–99)
Glucose-Capillary: 185 mg/dL — ABNORMAL HIGH (ref 70–99)
Glucose-Capillary: 202 mg/dL — ABNORMAL HIGH (ref 70–99)
Glucose-Capillary: 247 mg/dL — ABNORMAL HIGH (ref 70–99)
Glucose-Capillary: 251 mg/dL — ABNORMAL HIGH (ref 70–99)
Glucose-Capillary: 252 mg/dL — ABNORMAL HIGH (ref 70–99)
Glucose-Capillary: 254 mg/dL — ABNORMAL HIGH (ref 70–99)
Glucose-Capillary: 264 mg/dL — ABNORMAL HIGH (ref 70–99)
Glucose-Capillary: 264 mg/dL — ABNORMAL HIGH (ref 70–99)
Glucose-Capillary: 269 mg/dL — ABNORMAL HIGH (ref 70–99)
Glucose-Capillary: 279 mg/dL — ABNORMAL HIGH (ref 70–99)
Glucose-Capillary: 290 mg/dL — ABNORMAL HIGH (ref 70–99)
Glucose-Capillary: 33 mg/dL — CL (ref 70–99)
Glucose-Capillary: 335 mg/dL — ABNORMAL HIGH (ref 70–99)
Glucose-Capillary: 388 mg/dL — ABNORMAL HIGH (ref 70–99)
Glucose-Capillary: 392 mg/dL — ABNORMAL HIGH (ref 70–99)
Glucose-Capillary: 453 mg/dL — ABNORMAL HIGH (ref 70–99)

## 2023-02-17 LAB — CBC
HCT: 30.9 % — ABNORMAL LOW (ref 36.0–46.0)
Hemoglobin: 9.9 g/dL — ABNORMAL LOW (ref 12.0–15.0)
MCH: 29.4 pg (ref 26.0–34.0)
MCHC: 32 g/dL (ref 30.0–36.0)
MCV: 91.7 fL (ref 80.0–100.0)
Platelets: 196 10*3/uL (ref 150–400)
RBC: 3.37 MIL/uL — ABNORMAL LOW (ref 3.87–5.11)
RDW: 13.6 % (ref 11.5–15.5)
WBC: 10 10*3/uL (ref 4.0–10.5)
nRBC: 0 % (ref 0.0–0.2)

## 2023-02-17 LAB — URINALYSIS, ROUTINE W REFLEX MICROSCOPIC
Bacteria, UA: NONE SEEN
Bilirubin Urine: NEGATIVE
Glucose, UA: >=500 mg/dL — AB
Ketones, ur: 20 mg/dL — AB
Nitrite: NEGATIVE
Protein, ur: >=300 mg/dL — AB
Specific Gravity, Urine: 1.015 (ref 1.005–1.030)
pH: 5 (ref 5.0–8.0)

## 2023-02-17 LAB — PHOSPHORUS: Phosphorus: 3.4 mg/dL (ref 2.5–4.6)

## 2023-02-17 LAB — MAGNESIUM: Magnesium: 2.2 mg/dL (ref 1.7–2.4)

## 2023-02-17 LAB — BETA-HYDROXYBUTYRIC ACID: Beta-Hydroxybutyric Acid: >8 mmol/L — ABNORMAL HIGH (ref 0.05–0.27)

## 2023-02-17 LAB — MRSA NEXT GEN BY PCR, NASAL: MRSA by PCR Next Gen: NOT DETECTED

## 2023-02-17 MED ORDER — ONDANSETRON HCL 4 MG/2ML IJ SOLN: 4.0000 mg | Freq: Four times a day (QID) | INTRAMUSCULAR | Status: DC | PRN

## 2023-02-17 MED ORDER — LACTATED RINGERS IV SOLN: INTRAVENOUS | Status: DC

## 2023-02-17 MED ORDER — INSULIN GLARGINE-YFGN 100 UNIT/ML ~~LOC~~ SOLN
12.0000 [IU] | Freq: Every day | SUBCUTANEOUS | Status: DC
  Administered 2023-02-17 – 2023-02-18 (×2): 12 [IU] via SUBCUTANEOUS
  Filled 2023-02-17 (×4): qty 0.12

## 2023-02-17 MED ORDER — CHLORHEXIDINE GLUCONATE CLOTH 2 % EX PADS
6.0000 | MEDICATED_PAD | Freq: Every day | CUTANEOUS | Status: DC
  Administered 2023-02-17 – 2023-02-21 (×4): 6 via TOPICAL

## 2023-02-17 MED ORDER — POTASSIUM CHLORIDE 10 MEQ/100ML IV SOLN
10.0000 meq | INTRAVENOUS | Status: AC
  Administered 2023-02-17 (×6): 10 meq via INTRAVENOUS
  Filled 2023-02-17 (×6): qty 100

## 2023-02-17 MED ORDER — POTASSIUM CHLORIDE CRYS ER 20 MEQ PO TBCR
40.0000 meq | EXTENDED_RELEASE_TABLET | Freq: Once | ORAL | Status: AC
  Administered 2023-02-17: 40 meq via ORAL
  Filled 2023-02-17: qty 2

## 2023-02-17 MED ORDER — ONDANSETRON HCL 4 MG/2ML IJ SOLN
4.0000 mg | Freq: Once | INTRAMUSCULAR | Status: AC
  Administered 2023-02-17: 4 mg via INTRAVENOUS
  Filled 2023-02-17: qty 2

## 2023-02-17 MED ORDER — INSULIN ASPART 100 UNIT/ML IV SOLN
8.0000 [IU] | Freq: Once | INTRAVENOUS | Status: AC
  Administered 2023-02-17: 8 [IU] via INTRAVENOUS

## 2023-02-17 MED ORDER — ORAL CARE MOUTH RINSE: 15.0000 mL | OROMUCOSAL | Status: DC | PRN

## 2023-02-17 MED ORDER — INSULIN REGULAR(HUMAN) IN NACL 100-0.9 UT/100ML-% IV SOLN
INTRAVENOUS | Status: DC
  Administered 2023-02-17: 6.5 [IU]/h via INTRAVENOUS
  Filled 2023-02-17: qty 100

## 2023-02-17 MED ORDER — SODIUM CHLORIDE 0.9 % IV SOLN: INTRAVENOUS | Status: DC

## 2023-02-17 MED ORDER — HYDRALAZINE HCL 20 MG/ML IJ SOLN
10.0000 mg | Freq: Four times a day (QID) | INTRAMUSCULAR | Status: DC | PRN
  Administered 2023-02-19 – 2023-02-21 (×4): 10 mg via INTRAVENOUS
  Filled 2023-02-17 (×4): qty 1

## 2023-02-17 MED ORDER — ORAL CARE MOUTH RINSE
15.0000 mL | OROMUCOSAL | Status: DC
  Administered 2023-02-17 – 2023-02-19 (×8): 15 mL via OROMUCOSAL

## 2023-02-17 MED ORDER — DEXTROSE 50 % IV SOLN: 0.0000 mL | INTRAVENOUS | Status: DC | PRN

## 2023-02-17 MED ORDER — INSULIN ASPART 100 UNIT/ML IJ SOLN
0.0000 [IU] | INTRAMUSCULAR | Status: DC
  Administered 2023-02-17: 3 [IU] via SUBCUTANEOUS
  Administered 2023-02-17 – 2023-02-18 (×2): 2 [IU] via SUBCUTANEOUS
  Administered 2023-02-18 (×2): 5 [IU] via SUBCUTANEOUS
  Administered 2023-02-18: 8 [IU] via SUBCUTANEOUS
  Administered 2023-02-19: 2 [IU] via SUBCUTANEOUS

## 2023-02-17 MED ORDER — ACETAMINOPHEN 650 MG RE SUPP: 650.0000 mg | Freq: Four times a day (QID) | RECTAL | Status: DC | PRN

## 2023-02-17 MED ORDER — PHENOL 1.4 % MT LIQD: 1.0000 | OROMUCOSAL | Status: DC | PRN

## 2023-02-17 MED ORDER — SODIUM CHLORIDE 0.9 % IV BOLUS
1000.0000 mL | Freq: Once | INTRAVENOUS | Status: AC
  Administered 2023-02-17: 1000 mL via INTRAVENOUS

## 2023-02-17 MED ORDER — INSULIN GLARGINE (1 UNIT DIAL) 300 UNIT/ML ~~LOC~~ SOPN: 12.0000 [IU] | PEN_INJECTOR | Freq: Every day | SUBCUTANEOUS | Status: DC

## 2023-02-17 MED ORDER — LACTATED RINGERS IV BOLUS
20.0000 mL/kg | Freq: Once | INTRAVENOUS | Status: AC
  Administered 2023-02-17: 1052 mL via INTRAVENOUS

## 2023-02-17 MED ORDER — ACETAMINOPHEN 325 MG PO TABS: 650.0000 mg | ORAL_TABLET | Freq: Four times a day (QID) | ORAL | Status: DC | PRN

## 2023-02-17 MED ORDER — ONDANSETRON HCL 4 MG PO TABS: 4.0000 mg | ORAL_TABLET | Freq: Four times a day (QID) | ORAL | Status: DC | PRN

## 2023-02-17 MED ORDER — POTASSIUM CHLORIDE 10 MEQ/100ML IV SOLN
10.0000 meq | INTRAVENOUS | Status: AC
  Administered 2023-02-17 (×4): 10 meq via INTRAVENOUS
  Filled 2023-02-17 (×4): qty 100

## 2023-02-17 MED ORDER — HEPARIN SODIUM (PORCINE) 5000 UNIT/ML IJ SOLN
5000.0000 [IU] | Freq: Three times a day (TID) | INTRAMUSCULAR | Status: DC
  Administered 2023-02-17 – 2023-02-21 (×12): 5000 [IU] via SUBCUTANEOUS
  Filled 2023-02-17 (×12): qty 1

## 2023-02-17 MED ORDER — DEXTROSE IN LACTATED RINGERS 5 % IV SOLN: INTRAVENOUS | Status: DC

## 2023-02-17 MED ORDER — MENTHOL 3 MG MT LOZG
1.0000 | LOZENGE | OROMUCOSAL | Status: DC | PRN
  Filled 2023-02-17: qty 9

## 2023-02-17 NOTE — ED Provider Notes (Signed)
[place] EMERGENCY DEPARTMENT AT [facility] Provider Note   CSN: [id number] Arrival date & time: 02/16/23  2345     History  Chief Complaint  Patient presents with   Hyperglycemia    Emily Jennings is a 89 y.o. female.  CBC is patient is an 89 year old female brought by her husband for evaluation of elevated blood sugar.  She reports a recent change in her insulin.  For the past couple of days, her sugars been running higher.  This evening it read "high", then patient presented here.  She is somewhat of a difficult historian, but denies to me that she is having other symptoms.  The history is provided by the patient.       Home Medications Prior to Admission medications   Medication Sig Start Date End Date Taking? Authorizing Provider  alendronate (FOSAMAX) 70 MG tablet TAKE 1 TABLET BY MOUTH EVERY 7 DAYS. TAKE WITH A FULL GLASS OF WATER ON AN EMPTY STOMACH 01/04/22   [name], MD  amLODipine-olmesartan (AZOR) 5-40 MG tablet Take 1 tablet by mouth daily. 01/18/23   Strader, Lennart Pall, PA-C  aspirin EC 81 MG tablet Take 1 tablet (81 mg total) by mouth daily with breakfast. 09/17/22   Emokpae, Courage, MD  blood glucose meter kit and supplies KIT One touch Ultra. Use three times daily as directed. (FOR ICD E11.65) 10/27/20   Roma Kayser, MD  calcitRIOL (ROCALTROL) 0.25 MCG capsule Take 0.25 mcg by mouth 3 (three) times a week.    [provider]  carvedilol (COREG) 12.5 MG tablet Take 12.5 mg by mouth 2 (two) times daily with a meal.    [provider]  Continuous Blood Gluc Sensor (DEXCOM G7 SENSOR) MISC 1 Device by Does not apply route as directed. 12/25/22   Shamleffer, Konrad Dolores, MD  FARXIGA 10 MG TABS tablet Take 10 mg by mouth every morning. 11/13/22   [provider]  furosemide (LASIX) 40 MG tablet Take 1 tablet (40 mg total) by mouth 2 (two) times daily. 10/09/22   Sharlene Dory, NP  glucose blood [facility] ULTRA)  test strip USE TO TEST 4 TIMES A DAY 01/09/23   Roma Kayser, MD  insulin glargine, 1 Unit Dial, (TOUJEO SOLOSTAR) 300 UNIT/ML Solostar Pen Inject 12 Units into the skin daily. 02/16/23   Shamleffer, Konrad Dolores, MD  Insulin Pen Needle 32G X 4 MM MISC 1 each by Does not apply route in the morning and at bedtime. 12/25/22   Shamleffer, Konrad Dolores, MD  OneTouch Delica Lancets 33G MISC 1 each by Does not apply route 3 (three) times daily. Use to check glucose three times daily 10/03/22   Roma Kayser, MD  Potassium Chloride ER 20 MEQ TBCR Take 1 tablet (20 mEq total) by mouth daily. 1 tab daily by mouth--- take while taking Lasix/furosemide 12/29/22   [name], MD  rosuvastatin (CRESTOR) 5 MG tablet TAKE 1 TABLET (5 MG TOTAL) BY MOUTH DAILY. 01/15/23   [name], MD  spironolactone (ALDACTONE) 50 MG tablet Take 50 mg by mouth daily. 11/19/22   [provider]      Allergies    Benadryl [diphenhydramine hcl], Citalopram, Metformin and related, Tramadol, and Crestor [rosuvastatin]    Review of Systems   Review of Systems  All other systems reviewed and are negative.   Physical Exam Updated Vital Signs BP (!) 178/66   Pulse 91   Temp 98.4 F (36.9 C) (  Oral)   Resp 18   Ht 5' (1.524 m)   Wt 52.6 kg   SpO2 96%   BMI 22.65 kg/m  Physical Exam Vitals and nursing note reviewed.  Constitutional:      General: She is not in acute distress.    Appearance: She is well-developed. She is not diaphoretic.  HENT:     Head: Normocephalic and atraumatic.  Cardiovascular:     Rate and Rhythm: Normal rate and regular rhythm.     Heart sounds: No murmur heard.    No friction rub. No gallop.  Pulmonary:     Effort: Pulmonary effort is normal. No respiratory distress.     Breath sounds: Normal breath sounds. No wheezing.  Abdominal:     General: Bowel sounds are normal. There is no distension.     Palpations: Abdomen is soft.     Tenderness: There  is no abdominal tenderness.  Musculoskeletal:        General: Normal range of motion.     Cervical back: Normal range of motion and neck supple.  Skin:    General: Skin is warm and dry.  Neurological:     General: No focal deficit present.     Mental Status: She is alert and oriented to person, place, and time.     ED Results / Procedures / Treatments   Labs (all labs ordered are listed, but only abnormal results are displayed) Labs Reviewed  CBC WITH DIFFERENTIAL/PLATELET - Abnormal; Notable for the following components:      Result Value   Neutro Abs 8.1 (*)    All other components within normal limits  CBG MONITORING, ED - Abnormal; Notable for the following components:   Glucose-Capillary 527 (*)    All other components within normal limits  COMPREHENSIVE METABOLIC PANEL    EKG EKG Interpretation  Date/Time:  Saturday February 17 2023 00:13:11 EDT Ventricular Rate:  92 PR Interval:  193 QRS Duration: 90 QT Interval:  395 QTC Calculation: 476 R Axis:   5 Text Interpretation: Sinus rhythm Probable left atrial enlargement Baseline wander in lead(s) II III aVF Confirmed by Geoffery Lyons ([id number]) on 02/17/2023 1:47:48 AM  Radiology No results found.  Procedures Procedures    Medications Ordered in ED Medications  sodium chloride 0.9 % bolus 1,000 mL (has no administration in time range)  ondansetron (ZOFRAN) injection 4 mg (has no administration in time range)  insulin aspart (novoLOG) injection 8 Units (has no administration in time range)    ED Course/ Medical Decision Making/ A&P  Patient is an 89 year old female with history of diabetes presenting with elevated blood sugars, weakness, and feeling generally unwell since her recent change in her diabetes medications by her primary doctor.  Patient arrives here afebrile with stable vital signs.  She is thin and somewhat chronically ill-appearing.  Physical examination otherwise unremarkable.  Laboratory studies  obtained.  Basically unremarkable, but metabolic panel reveals a sugar of 544, CO2 of 16, and anion gap of 31 consistent with diabetic ketoacidosis.  She also has AKI with a creatinine of 2.43 which is elevated from her baseline.  Patient was started with hydration and NovoLog.  She was then started on an insulin drip per hyperglycemic protocol.  Patient to be admitted to the hospitalist service for correction.  CRITICAL CARE Performed by: Geoffery Lyons Total critical care time: 35 minutes Critical care time was exclusive of separately billable procedures and treating other patients. Critical care was necessary to treat or prevent  imminent or life-threatening deterioration. Critical care was time spent personally by me on the following activities: development of treatment plan with patient and/or surrogate as well as nursing, discussions with consultants, evaluation of patient's response to treatment, examination of patient, obtaining history from patient or surrogate, ordering and performing treatments and interventions, ordering and review of laboratory studies, ordering and review of radiographic studies, pulse oximetry and re-evaluation of patient's condition.   Final Clinical Impression(s) / ED Diagnoses Final diagnoses:  None    Rx / DC Orders ED Discharge Orders     None         Geoffery Lyons, MD 02/17/23 [phone]

## 2023-02-17 NOTE — ED Notes (Addendum)
CBG during Triage is 527. EDP Notified.

## 2023-02-17 NOTE — Progress Notes (Signed)
Because of her extreme hypokalemia, the patient transitioned off the endo tool with several exceptions. Patient has been A&Ox4 all shift and mobilized at her highest level of mobility several times this shift. Vital signs stable. Provider discontinued tele. Safety maintained, call bell within reach, bed in lowest position, bed alarm on and functioning without any issues. Endorsed to the receiving nurse on unit 300. Spouse at the bedside when patient left the unit.

## 2023-02-17 NOTE — Progress Notes (Signed)
Patient arrived to unit, vitals are stable. Patient family is at bedside. Questions addressed and patient oriented to the room. Bed alarm is on. Call bell within reach. Patient admitted to MED- SURG.

## 2023-02-17 NOTE — H&P (Signed)
History and Physical    Patient: Emily Jennings:[id number] DOB: [birth year] DOA: 02/16/2023 DOS: the patient was seen and examined on 02/17/2023 PCP: [name], MD  Patient coming from: Home  Chief Complaint:  Chief Complaint  Patient presents with   Hyperglycemia   HPI: Emily Jennings is a 89 y.o. female with medical history significant of diabetes mellitus, hypertension, hyperlipidemia who presents to the emergency department after being brought by the husband for evaluation of elevated blood glucose level.  Patient states that there was a recent change in her insulin which resulted in having nausea, vomiting, several episodes of loose bowel movements after taking the new insulin.  She went back to her endocrinologist and adjustment was made to a new insulin prescribed.  On getting home, she checked her blood glucose level which read " high ", she called the pharmacy to fill the new prescription, however, she was advised by the pharmacist to go to the ED for further evaluation and management.   ED Course:  In the emergency department, BP was elevated at 170/66, but other vital signs are within normal range.  Workup in the ED showed normal CBC, BMP showed sodium 129, potassium 3.0, chloride 87, bicarb 16, blood glucose 544, BUN 47, creatinine 2.43 (baseline creatinine at 1.4-1.7), albumin 3.3, AST 49, ALT 29, total bilirubin 1.9, EGFR 19, anion gap 31, beta hydroxybutyric acid > 8.0 Patient was started on insulin drip per Endo tool.  Potassium was replenished.  Hospitalist was asked to admit patient for further evaluation and management.   Review of Systems: Review of systems as noted in the HPI. All other systems reviewed and are negative.   Past Medical History:  Diagnosis Date   Allergy    Anxiety    ANXIETY DISORDER, GENERALIZED 07/15/2007   Qualifier: Diagnosis of  By: Chipper Herb     Arthritis    Cancer [facility]) 2009   breast, carcinoma in situ left   Carcinoma in  situ of breast 05/21/2008   Qualifier: Diagnosis of  By: [name] MD, Margaret  Diagnosed in 2009, completed 5 year course of tamoxifen, no evidence of recurrence    Carotid stenosis    11/16/2005  mild plaque formation and stenosis proximal right ECA   Cataract    Complication of anesthesia    Coronary artery disease    cardiac catheterization on 03/20/2006  LAD mid 40% stenosis, left circumflex mild 40% stenosis, RCA mid-vessel 40% to 50% lesion   EF 60%   Diabetes mellitus    GERD (gastroesophageal reflux disease)    Hernia, inguinal    left   Hyperglycemia    Hypertension    Insomnia 11/16/2011   Low blood potassium    Non-insulin dependent type 2 diabetes mellitus (HCC)    Osteoporosis    Shortness of breath    2D Echocardiogram 01/26/2009   EF of greater than 55%, mild MR, mild TR, normal ventricular function   Thickened endometrium 10/26/2017   Noted by gyne in 2017, missed 6 month follow up, referred in 09/2017   Ventricular tachycardia, non-sustained (HCC)    developed during stress test 02/08/2006, spontaneously aborted, mild reversible apical defect   Past Surgical History:  Procedure Laterality Date   BREAST LUMPECTOMY Left 2009   Left breast 2009   CATARACT EXTRACTION W/PHACO Left 10/28/2014   Procedure: PHACO EMULSION CATARACT EXTRACTION WITH INTRAOCULAR LENS IMPLANT LEFT EYE (IOC);  Surgeon: Chalmers Guest, MD;  Location: [facility] OR;  Service: Ophthalmology;  Laterality: Left;   COLONOSCOPY     cyst removed from left foot     REFRACTIVE SURGERY Left     Social History:  reports that she has never smoked. She has never been exposed to tobacco smoke. She has never used smokeless tobacco. She reports that she does not drink alcohol and does not use drugs.   Allergies  Allergen Reactions   Benadryl [Diphenhydramine Hcl] Hypertension   Citalopram Other (See Comments)    unknown   Metformin And Related Diarrhea    Lost appetite and weight    Tramadol Other (See Comments)     Felt light headed and dizzy   Crestor [Rosuvastatin] Other (See Comments)    "Feet swelling", makes her feel weak    Family History  Problem Relation Age of Onset   Hypertension Mother    Hyperlipidemia Mother    Stroke Mother    Urticaria Mother    Cancer Father        pancreatic   Colon cancer Father    Heart disease Brother 40       bypass   Heart disease Brother 7       bypass   Arthritis Other    Asthma Other    Diabetes Other    Colon cancer Paternal Aunt    Esophageal cancer Neg Hx    Stomach cancer Neg Hx    Rectal cancer Neg Hx      Prior to Admission medications   Medication Sig Start Date End Date Taking? Authorizing Provider  alendronate (FOSAMAX) 70 MG tablet TAKE 1 TABLET BY MOUTH EVERY 7 DAYS. TAKE WITH A FULL GLASS OF WATER ON AN EMPTY STOMACH 01/04/22   [name], MD  amLODipine-olmesartan (AZOR) 5-40 MG tablet Take 1 tablet by mouth daily. 01/18/23   Strader, Lennart Pall, PA-C  aspirin EC 81 MG tablet Take 1 tablet (81 mg total) by mouth daily with breakfast. 09/17/22   Emokpae, Courage, MD  blood glucose meter kit and supplies KIT One touch Ultra. Use three times daily as directed. (FOR ICD E11.65) 10/27/20   Roma Kayser, MD  calcitRIOL (ROCALTROL) 0.25 MCG capsule Take 0.25 mcg by mouth 3 (three) times a week.    [provider]  carvedilol (COREG) 12.5 MG tablet Take 12.5 mg by mouth 2 (two) times daily with a meal.    [provider]  Continuous Blood Gluc Sensor (DEXCOM G7 SENSOR) MISC 1 Device by Does not apply route as directed. 12/25/22   Shamleffer, Konrad Dolores, MD  FARXIGA 10 MG TABS tablet Take 10 mg by mouth every morning. 11/13/22   [provider]  furosemide (LASIX) 40 MG tablet Take 1 tablet (40 mg total) by mouth 2 (two) times daily. 10/09/22   Sharlene Dory, NP  glucose blood [facility] ULTRA) test strip USE TO TEST 4 TIMES A DAY 01/09/23   Roma Kayser, MD  insulin glargine, 1 Unit Dial,  (TOUJEO SOLOSTAR) 300 UNIT/ML Solostar Pen Inject 12 Units into the skin daily. 02/16/23   Shamleffer, Konrad Dolores, MD  Insulin Pen Needle 32G X 4 MM MISC 1 each by Does not apply route in the morning and at bedtime. 12/25/22   Shamleffer, Konrad Dolores, MD  OneTouch Delica Lancets 33G MISC 1 each by Does not apply route 3 (three) times daily. Use to check glucose three times daily 10/03/22   Roma Kayser, MD  Potassium Chloride ER 20 MEQ TBCR Take 1 tablet (20 mEq  total) by mouth daily. 1 tab daily by mouth--- take while taking Lasix/furosemide 12/29/22   [name], MD  rosuvastatin (CRESTOR) 5 MG tablet TAKE 1 TABLET (5 MG TOTAL) BY MOUTH DAILY. 01/15/23   [name], MD  spironolactone (ALDACTONE) 50 MG tablet Take 50 mg by mouth daily. 11/19/22   [provider]    Physical Exam: BP (!) 177/67 (BP Location: Left Arm)   Pulse 92   Temp 98.3 F (36.8 C) (Oral)   Resp 20   Ht 5' (1.524 m)   Wt 52.7 kg   SpO2 98%   BMI 22.69 kg/m   General: 89 y.o. year-old female sleepy, but easily arousable and able to provide a history.  She was in no acute distress.   HEENT: NCAT, EOMI, dry mucous membrane Neck: Supple, trachea medial Cardiovascular: Regular rate and rhythm with no rubs or gallops.  No thyromegaly or JVD noted.  +2 lower extremity edema bilaterally. 2/4 pulses in all 4 extremities. Respiratory: Clear to auscultation with no wheezes or rales. Good inspiratory effort. Abdomen: Soft, nontender nondistended with normal bowel sounds x4 quadrants. Muskuloskeletal: No cyanosis, clubbing noted bilaterally Neuro: No focal neurologic deficit.  Sensation, reflexes intact Skin: No ulcerative lesions noted or rashes Psychiatry: Mood is appropriate for condition and setting          Labs on Admission:  Basic Metabolic Panel: Recent Labs  Lab 02/17/23 0028  NA 129*  K 3.0*  CL 82*  CO2 16*  GLUCOSE 544*  BUN 47*  CREATININE 2.43*  CALCIUM 8.7*    Liver Function Tests: Recent Labs  Lab 02/17/23 0028  AST 49*  ALT 29  ALKPHOS 90  BILITOT 1.9*  PROT 6.2*  ALBUMIN 3.3*   No results for input(s): "LIPASE", "AMYLASE" in the last 168 hours. No results for input(s): "AMMONIA" in the last 168 hours. CBC: Recent Labs  Lab 02/17/23 0028  WBC 9.5  NEUTROABS 8.1*  HGB 12.2  HCT 37.2  MCV 92.5  PLT 242   Cardiac Enzymes: No results for input(s): "CKTOTAL", "CKMB", "CKMBINDEX", "TROPONINI" in the last 168 hours.  BNP (last 3 results) Recent Labs    09/05/22 1500 09/13/22 0740 09/15/22 0032  BNP 848.0* 710.0* 827.0*    ProBNP (last 3 results) No results for input(s): "PROBNP" in the last 8760 hours.  CBG: Recent Labs  Lab 02/17/23 0003 02/17/23 0130 02/17/23 0203 02/17/23 0243 02/17/23 0316  GLUCAP 527* 525* 488* 453* 392*    Radiological Exams on Admission: No results found.  EKG: I independently viewed the EKG done and my findings are as followed: Normal sinus rhythm at a rate of 92 bpm  Assessment/Plan Present on Admission:  DKA (diabetic ketoacidosis) (HCC)  Hypokalemia  Essential hypertension  Hyperlipidemia LDL goal <100  Principal Problem:   DKA (diabetic ketoacidosis) (HCC) Active Problems:   Essential hypertension   Hyperlipidemia LDL goal <100   Hypokalemia   High anion gap metabolic acidosis   Pseudohyponatremia   Hypoalbuminemia due to protein-calorie malnutrition (HCC)   Acute kidney injury superimposed on chronic kidney disease (HCC)   Dehydration   High Anion gap metabolic acidosis secondary to DKA Hyperglycemia secondary to poorly controlled type 2 diabetes mellitus Continue insulin drip, IV LR with IV potassium per DKA protocol Transition IV LR to D5 LR when serum glucose reaches 250mg /dL Continue serial BMP and VBG Continue to monitor for anion gap closure (currently at 31) prior to transitioning patient to subcu insulin Continue NPO  Pseudohyponatremia Na 129,  corrected sodium level based on CBG (544) was 136 Continue to monitor sodium levels  Hypokalemia K+ 3.0, this will be replenished  Hypoalbuminemia possibly secondary to mild protein calorie malnutrition Albumin 3.3, consider protein supplement when patient resumes oral intake  Acute kidney injury on CKD 4 Dehydration Creatinine 2.43 (baseline creatinine at 1.4-1.7) Continue IV hydration Renally adjust medications, avoid nephrotoxic agents/dehydration/hypotension  Essential hypertension Continue IV hydralazine 10 mg every 6 hours as needed for SBP > 170 Consider starting patient's home meds when she resumes oral intake  Mixed hyperlipidemia Consider starting patient's home meds when she resumes oral intake   DVT prophylaxis: Heparin subcu   Advance Care Planning:   Code Status: Full Code   Consults: None  Family Communication: Husband at bedside (all questions answered to satisfaction)  Severity of Illness: The appropriate patient status for this patient is OBSERVATION. Observation status is judged to be reasonable and necessary in order to provide the required intensity of service to ensure the patient's safety. The patient's presenting symptoms, physical exam findings, and initial radiographic and laboratory data in the context of their medical condition is felt to place them at decreased risk for further clinical deterioration. Furthermore, it is anticipated that the patient will be medically stable for discharge from the hospital within 2 midnights of admission.   Author: Frankey Shown, DO 02/17/2023 4:13 AM  For on call review [email or web address].

## 2023-02-17 NOTE — ED Triage Notes (Signed)
Pt arrived via POV from home c/o elevated CBG at home reporting it was > 500. Pt endorses symptoms of N/V. Pt reports seeing PCP and recently had changes made with her insulin.

## 2023-02-17 NOTE — Progress Notes (Signed)
Emily Jennings is a 89 y.o. female with medical history significant of diabetes mellitus, hypertension, hyperlipidemia who presents to the emergency department after being brought by the husband for evaluation of elevated blood glucose level.  Patient states that there was a recent change in her insulin which resulted in having nausea, vomiting, several episodes of loose bowel movements after taking the new insulin.   Patient was admitted with DKA as well as severe hypokalemia in the setting of GI loss as well as AKI on CKD stage IV.  She will now be ready for transition off of IV insulin as her acidosis and anion gap have resolved.  She no longer has any significant GI upset.  Blood glucose levels are still somewhat elevated and plan will be to check hemoglobin A1c and obtain diabetes coordinator recommendations.  Patient was seen and evaluated at bedside and case discussed with husband.  Continue potassium repletion as well as IV fluid for treatment of AKI after transition and anticipate discharge in the next 24 hours.  Total care time: 35 minutes.

## 2023-02-18 ENCOUNTER — Observation Stay (HOSPITAL_COMMUNITY): Payer: Medicare Other

## 2023-02-18 DIAGNOSIS — Z8249 Family history of ischemic heart disease and other diseases of the circulatory system: Secondary | ICD-10-CM | POA: Diagnosis not present

## 2023-02-18 DIAGNOSIS — W19XXXA Unspecified fall, initial encounter: Secondary | ICD-10-CM | POA: Diagnosis present

## 2023-02-18 DIAGNOSIS — I2510 Atherosclerotic heart disease of native coronary artery without angina pectoris: Secondary | ICD-10-CM | POA: Diagnosis present

## 2023-02-18 DIAGNOSIS — E782 Mixed hyperlipidemia: Secondary | ICD-10-CM | POA: Diagnosis present

## 2023-02-18 DIAGNOSIS — I10 Essential (primary) hypertension: Secondary | ICD-10-CM | POA: Diagnosis not present

## 2023-02-18 DIAGNOSIS — E876 Hypokalemia: Secondary | ICD-10-CM | POA: Diagnosis present

## 2023-02-18 DIAGNOSIS — I6521 Occlusion and stenosis of right carotid artery: Secondary | ICD-10-CM | POA: Diagnosis present

## 2023-02-18 DIAGNOSIS — E8809 Other disorders of plasma-protein metabolism, not elsewhere classified: Secondary | ICD-10-CM | POA: Diagnosis present

## 2023-02-18 DIAGNOSIS — E1110 Type 2 diabetes mellitus with ketoacidosis without coma: Secondary | ICD-10-CM | POA: Diagnosis present

## 2023-02-18 DIAGNOSIS — Z8349 Family history of other endocrine, nutritional and metabolic diseases: Secondary | ICD-10-CM | POA: Diagnosis not present

## 2023-02-18 DIAGNOSIS — Z833 Family history of diabetes mellitus: Secondary | ICD-10-CM | POA: Diagnosis not present

## 2023-02-18 DIAGNOSIS — I160 Hypertensive urgency: Secondary | ICD-10-CM | POA: Diagnosis present

## 2023-02-18 DIAGNOSIS — N179 Acute kidney failure, unspecified: Secondary | ICD-10-CM | POA: Diagnosis present

## 2023-02-18 DIAGNOSIS — Z79899 Other long term (current) drug therapy: Secondary | ICD-10-CM | POA: Diagnosis not present

## 2023-02-18 DIAGNOSIS — E785 Hyperlipidemia, unspecified: Secondary | ICD-10-CM | POA: Diagnosis not present

## 2023-02-18 DIAGNOSIS — N184 Chronic kidney disease, stage 4 (severe): Secondary | ICD-10-CM | POA: Diagnosis present

## 2023-02-18 DIAGNOSIS — Z888 Allergy status to other drugs, medicaments and biological substances status: Secondary | ICD-10-CM | POA: Diagnosis not present

## 2023-02-18 DIAGNOSIS — K219 Gastro-esophageal reflux disease without esophagitis: Secondary | ICD-10-CM | POA: Diagnosis present

## 2023-02-18 DIAGNOSIS — E1122 Type 2 diabetes mellitus with diabetic chronic kidney disease: Secondary | ICD-10-CM | POA: Diagnosis present

## 2023-02-18 DIAGNOSIS — Z823 Family history of stroke: Secondary | ICD-10-CM | POA: Diagnosis not present

## 2023-02-18 DIAGNOSIS — Z7982 Long term (current) use of aspirin: Secondary | ICD-10-CM | POA: Diagnosis not present

## 2023-02-18 DIAGNOSIS — Z825 Family history of asthma and other chronic lower respiratory diseases: Secondary | ICD-10-CM | POA: Diagnosis not present

## 2023-02-18 DIAGNOSIS — Z7984 Long term (current) use of oral hypoglycemic drugs: Secondary | ICD-10-CM | POA: Diagnosis not present

## 2023-02-18 DIAGNOSIS — I129 Hypertensive chronic kidney disease with stage 1 through stage 4 chronic kidney disease, or unspecified chronic kidney disease: Secondary | ICD-10-CM | POA: Diagnosis present

## 2023-02-18 DIAGNOSIS — E860 Dehydration: Secondary | ICD-10-CM | POA: Diagnosis present

## 2023-02-18 DIAGNOSIS — Z7983 Long term (current) use of bisphosphonates: Secondary | ICD-10-CM | POA: Diagnosis not present

## 2023-02-18 DIAGNOSIS — M810 Age-related osteoporosis without current pathological fracture: Secondary | ICD-10-CM | POA: Diagnosis present

## 2023-02-18 LAB — CBC
HCT: 32.9 % — ABNORMAL LOW (ref 36.0–46.0)
Hemoglobin: 10.8 g/dL — ABNORMAL LOW (ref 12.0–15.0)
MCH: 29.8 pg (ref 26.0–34.0)
MCHC: 32.8 g/dL (ref 30.0–36.0)
MCV: 90.9 fL (ref 80.0–100.0)
Platelets: 198 10*3/uL (ref 150–400)
RBC: 3.62 MIL/uL — ABNORMAL LOW (ref 3.87–5.11)
RDW: 14 % (ref 11.5–15.5)
WBC: 14 10*3/uL — ABNORMAL HIGH (ref 4.0–10.5)
nRBC: 0 % (ref 0.0–0.2)

## 2023-02-18 LAB — GLUCOSE, CAPILLARY
Glucose-Capillary: 132 mg/dL — ABNORMAL HIGH (ref 70–99)
Glucose-Capillary: 176 mg/dL — ABNORMAL HIGH (ref 70–99)
Glucose-Capillary: 225 mg/dL — ABNORMAL HIGH (ref 70–99)
Glucose-Capillary: 227 mg/dL — ABNORMAL HIGH (ref 70–99)
Glucose-Capillary: 288 mg/dL — ABNORMAL HIGH (ref 70–99)
Glucose-Capillary: 46 mg/dL — ABNORMAL LOW (ref 70–99)
Glucose-Capillary: 48 mg/dL — ABNORMAL LOW (ref 70–99)
Glucose-Capillary: 50 mg/dL — ABNORMAL LOW (ref 70–99)
Glucose-Capillary: 68 mg/dL — ABNORMAL LOW (ref 70–99)
Glucose-Capillary: 71 mg/dL (ref 70–99)
Glucose-Capillary: 77 mg/dL (ref 70–99)

## 2023-02-18 LAB — BASIC METABOLIC PANEL
Anion gap: 10 (ref 5–15)
Anion gap: 7 (ref 5–15)
BUN: 53 mg/dL — ABNORMAL HIGH (ref 8–23)
BUN: 48 mg/dL — ABNORMAL HIGH (ref 8–23)
CO2: 28 mmol/L (ref 22–32)
CO2: 28 mmol/L (ref 22–32)
Calcium: 8.1 mg/dL — ABNORMAL LOW (ref 8.9–10.3)
Calcium: 7.9 mg/dL — ABNORMAL LOW (ref 8.9–10.3)
Chloride: 94 mmol/L — ABNORMAL LOW (ref 98–111)
Chloride: 97 mmol/L — ABNORMAL LOW (ref 98–111)
Creatinine, Ser: 2.34 mg/dL — ABNORMAL HIGH (ref 0.44–1.00)
Creatinine, Ser: 2.2 mg/dL — ABNORMAL HIGH (ref 0.44–1.00)
GFR, Estimated: 20 mL/min — ABNORMAL LOW (ref >60)
GFR, Estimated: 21 mL/min — ABNORMAL LOW (ref >60)
Glucose, Bld: 259 mg/dL — ABNORMAL HIGH (ref 70–99)
Glucose, Bld: 187 mg/dL — ABNORMAL HIGH (ref 70–99)
Potassium: 3.4 mmol/L — ABNORMAL LOW (ref 3.5–5.1)
Potassium: 3.5 mmol/L (ref 3.5–5.1)
Sodium: 132 mmol/L — ABNORMAL LOW (ref 135–145)
Sodium: 132 mmol/L — ABNORMAL LOW (ref 135–145)

## 2023-02-18 LAB — MAGNESIUM: Magnesium: 2.3 mg/dL (ref 1.7–2.4)

## 2023-02-18 MED ORDER — POTASSIUM CHLORIDE CRYS ER 20 MEQ PO TBCR
40.0000 meq | EXTENDED_RELEASE_TABLET | Freq: Once | ORAL | Status: AC
  Administered 2023-02-18: 40 meq via ORAL
  Filled 2023-02-18: qty 2

## 2023-02-18 MED ORDER — POLYETHYLENE GLYCOL 3350 17 G PO PACK
17.0000 g | PACK | Freq: Every day | ORAL | Status: DC
  Filled 2023-02-18 (×2): qty 1

## 2023-02-18 NOTE — TOC Initial Note (Signed)
Transition of Care [facility] [employer]) - Initial/Assessment Note    Patient Details  Name: Emily Jennings MRN: [id number] Date of Birth: 1934-05-07  Transition of Care (TOC) CM/SW Contact:    Catalina Gravel, LCSW Phone Number: 02/18/2023, 11:51 AM  Clinical Narrative:                 Pt moved from OBS to admission due to medical needs. DC 1-2 days.    Barriers to Discharge: Continued Medical Work up   Patient Goals and CMS Choice            Expected Discharge Plan and Services                                              Prior Living Arrangements/Services                       Activities of Daily Living Home Assistive Devices/Equipment: CBG Meter ADL Screening (condition at time of admission) Patient's cognitive ability adequate to safely complete daily activities?: Yes Is the patient deaf or have difficulty hearing?: Yes Does the patient have difficulty seeing, even when wearing glasses/contacts?: No Does the patient have difficulty concentrating, remembering, or making decisions?: No Patient able to express need for assistance with ADLs?: Yes Does the patient have difficulty dressing or bathing?: No Independently performs ADLs?: Yes (appropriate for developmental age) Does the patient have difficulty walking or climbing stairs?: No Weakness of Legs: None Weakness of Arms/Hands: None  Permission Sought/Granted                  Emotional Assessment              Admission diagnosis:  DKA (diabetic ketoacidosis) (HCC) [E11.10] Diabetic ketoacidosis without coma associated with type 1 diabetes mellitus (HCC) [E10.10] Patient Active Problem List   Diagnosis Date Noted   DKA (diabetic ketoacidosis) (HCC) 02/17/2023   High anion gap metabolic acidosis 02/17/2023   Pseudohyponatremia 02/17/2023   Hypoalbuminemia due to protein-calorie malnutrition (HCC) 02/17/2023   Acute kidney injury superimposed on chronic kidney disease (HCC) 02/17/2023    Dehydration 02/17/2023   Hip pain 02/15/2023   Encounter for Medicare annual examination with abnormal findings 10/16/2022   Vitamin D deficiency 10/16/2022   Acute exacerbation of CHF (congestive heart failure) (HCC) 09/13/2022   Acute on chronic congestive heart failure with left ventricular diastolic dysfunction (HCC) 09/13/2022   Chronic kidney disease (CKD) stage G3b/A2, moderately decreased glomerular filtration rate (GFR) between 30-44 mL/min/1.73 square meter and albuminuria creatinine ratio between 30-299 mg/g (HCC) 09/13/2022   Mild aortic valve stenosis 09/13/2022   Dyspnea on exertion 09/05/2022   CHF (congestive heart failure) (HCC) 09/05/2022   Acute on chronic diastolic (congestive) heart failure (HCC) 09/05/2022   Hyperkalemia 04/09/2022   Hyperglycemic crisis due to diabetes mellitus (HCC) 03/25/2022   Hyponatremia 03/25/2022   Acute metabolic encephalopathy 03/22/2022   Hypothermia 03/22/2022   Heart murmur 03/22/2022   Bilateral lower extremity edema 03/22/2022   Syncope and collapse 01/13/2021   Lip swelling 10/30/2020   Right leg pain 10/30/2020   Tubular adenoma of colon 10/26/2017   Osteoporosis 07/28/2014   Diabetic hypertension-nephrosis syndrome (HCC) 03/23/2014   Stage 3b chronic kidney disease (HCC) 03/23/2014   Type 2 diabetes mellitus with stage 3a chronic kidney disease, with long-term current use of insulin (HCC) 09/29/2013  Allergic rhinitis 01/27/2013   GERD (gastroesophageal reflux disease) 01/12/2013   CVD (cardiovascular disease) 07/17/2011   Hypokalemia 08/31/2008   Hyperlipidemia LDL goal <100 07/15/2007   Essential hypertension 07/15/2007   PCP:  [name], MD Pharmacy:   [phone] [place], [place] - [address] AT [facility] [address] [place] [address] Phone: [phone] Fax: [phone]  [facility]) - [place], [place] - [address] [facility] [address] [place] [address] [place] [address] Phone: [phone] Fax: [phone]     Social Determinants of Health (SDOH) Social History: SDOH Screenings   Food Insecurity: No Food Insecurity (02/17/2023)  Housing: Low Risk  (02/17/2023)  Transportation Needs: No Transportation Needs (02/17/2023)  Utilities: Not At Risk (02/17/2023)  Alcohol Screen: Low Risk  (11/15/2022)  Depression (PHQ2-9): Low Risk  (02/15/2023)  Financial Resource Strain: Low Risk  (12/28/2022)  Physical Activity: Sufficiently Active (11/15/2022)  Social Connections: Moderately Integrated (11/15/2022)  Stress: No Stress Concern Present (12/28/2022)  Tobacco Use: Low Risk  (02/17/2023)   SDOH Interventions:     Readmission Risk Interventions     No data to display

## 2023-02-18 NOTE — Progress Notes (Signed)
Patient had hypoglyemic event with cbg of 33.  Hypoglycemic protocol for adults started and MD notified.  Intervention done and will recheck cbg for improvement. Emily Jennings

## 2023-02-18 NOTE — Progress Notes (Signed)
Patient ambulated to and from bathroom today with standby assist. Voids with no complaints. One BM around 1300. No complaints of pain during this shift. Educated patient about insulin and IVF.

## 2023-02-18 NOTE — Progress Notes (Signed)
Patient  vitals have been stable and no acute complaints of pain over night.  Patient did have a hypoglycemic event with her 0000 cbg. Cbg did increase to wnl.  Patient has been alert and cooperative this entire shift.

## 2023-02-18 NOTE — Progress Notes (Signed)
PROGRESS NOTE    Emily Jennings  NGE:[id number] DOB: 1934/05/26 DOA: 02/16/2023 PCP: [name], MD   Brief Narrative:  Emily Jennings is a 89 y.o. female with medical history significant of diabetes mellitus, hypertension, hyperlipidemia who presents to the emergency department after being brought by the husband for evaluation of elevated blood glucose level.  Patient states that there was a recent change in her insulin which resulted in having nausea, vomiting, several episodes of loose bowel movements after taking the new insulin.    Patient was admitted with DKA as well as severe hypokalemia in the setting of GI loss as well as AKI on CKD stage IV.  She has been transitioned off of IV insulin, but continues to have AKI that is persisting and she will remain on IV fluid for further hydration with ultrasound renal pending.  Assessment & Plan:   Principal Problem:   DKA (diabetic ketoacidosis) (HCC) Active Problems:   Essential hypertension   Hyperlipidemia LDL goal <100   Hypokalemia   High anion gap metabolic acidosis   Pseudohyponatremia   Hypoalbuminemia due to protein-calorie malnutrition (HCC)   Acute kidney injury superimposed on chronic kidney disease (HCC)   Dehydration  Assessment and Plan:   High Anion gap metabolic acidosis secondary to DKA-resolved Hyperglycemia secondary to poorly controlled type 2 diabetes mellitus Awaiting A1c Appreciate diabetes coordinator recommendations Continue to monitor   Pseudohyponatremia Monitor normal saline   Hypokalemia Replete and reevaluate in a.m.   Hypoalbuminemia possibly secondary to mild protein calorie malnutrition Albumin 3.3, consider protein supplement when patient resumes oral intake   Acute kidney injury on CKD 4 Dehydration Creatinine 2.43 on admission (baseline creatinine at 1.4-1.7) Continue IV hydration Renally adjust medications, avoid nephrotoxic agents/dehydration/hypotension Check renal  ultrasound   Essential hypertension Continue IV hydralazine 10 mg every 6 hours as needed for SBP > 170 Consider starting patient's home meds when she resumes oral intake   Mixed hyperlipidemia Consider starting patient's home meds when she resumes oral intake   DVT prophylaxis: Heparin Code Status: Full Family Communication: Husband at bedside 6/1 Disposition Plan:  Status is: Observation The patient will require care spanning > 2 midnights and should be moved to inpatient because: Ongoing need for IV fluid   Consultants:  None  Procedures:  None  Antimicrobials:  None   Subjective: Patient seen and evaluated today with no new acute complaints or concerns. No acute concerns or events noted overnight.  She continues to have poor appetite and has very little oral intake.  Denies any nausea or vomiting or abdominal pain.  Objective: Vitals:   02/17/23 1808 02/17/23 2027 02/17/23 2348 02/18/23 0408  BP: (!) 151/67 121/61 (!) 159/67 (!) 157/77  Pulse: 78 72 72 74  Resp:  16 16 18   Temp: 97.9 F (36.6 C) 98.1 F (36.7 C) 98 F (36.7 C) 98 F (36.7 C)  TempSrc: Oral     SpO2: 97% 94% 94% 94%  Weight:      Height:        Intake/Output Summary (Last 24 hours) at 02/18/2023 0853 Last data filed at 02/18/2023 0008 Gross per 24 hour  Intake 2739.23 ml  Output 250 ml  Net 2489.23 ml   Filed Weights   02/17/23 0002 02/17/23 0253 02/17/23 0449  Weight: 52.6 kg 52.7 kg 53.8 kg    Examination:  General exam: Appears calm and comfortable  Respiratory system: Clear to auscultation. Respiratory effort normal. Cardiovascular system: S1 & S2 heard, RRR.  Gastrointestinal system: Abdomen is soft Central nervous system: Alert and awake Extremities: No edema Skin: No significant lesions noted Psychiatry: Flat affect.    Data Reviewed: I have personally reviewed following labs and imaging studies  CBC: Recent Labs  Lab 02/17/23 0028 02/17/23 0501 02/18/23 0429  WBC  9.5 10.0 14.0*  NEUTROABS 8.1*  --   --   HGB 12.2 9.9* 10.8*  HCT 37.2 30.9* 32.9*  MCV 92.5 91.7 90.9  PLT 242 196 198   Basic Metabolic Panel: Recent Labs  Lab 02/17/23 0028 02/17/23 0501 02/17/23 1019 02/18/23 0429  NA 129* 131* 131* 132*  K 3.0* 2.3* 3.0* 3.4*  CL 82* 91* 91* 94*  CO2 16* 23 26 28   GLUCOSE 544* 365* 262* 259*  BUN 47* 48* 50* 53*  CREATININE 2.43* 2.24* 2.27* 2.34*  CALCIUM 8.7* 7.9* 8.0* 8.1*  MG  --  2.2  --  2.3  PHOS  --  3.4  --   --    GFR: Estimated Creatinine Clearance: 12.4 mL/min (A) (by C-G formula based on SCr of 2.34 mg/dL (H)). Liver Function Tests: Recent Labs  Lab 02/17/23 0028  AST 49*  ALT 29  ALKPHOS 90  BILITOT 1.9*  PROT 6.2*  ALBUMIN 3.3*   No results for input(s): "LIPASE", "AMYLASE" in the last 168 hours. No results for input(s): "AMMONIA" in the last 168 hours. Coagulation Profile: No results for input(s): "INR", "PROTIME" in the last 168 hours. Cardiac Enzymes: No results for input(s): "CKTOTAL", "CKMB", "CKMBINDEX", "TROPONINI" in the last 168 hours. BNP (last 3 results) No results for input(s): "PROBNP" in the last 8760 hours. HbA1C: No results for input(s): "HGBA1C" in the last 72 hours. CBG: Recent Labs  Lab 02/18/23 0007 02/18/23 0022 02/18/23 0125 02/18/23 0406 02/18/23 0808  GLUCAP 50* 77 176* 227* 225*   Lipid Profile: No results for input(s): "CHOL", "HDL", "LDLCALC", "TRIG", "CHOLHDL", "LDLDIRECT" in the last 72 hours. Thyroid Function Tests: No results for input(s): "TSH", "T4TOTAL", "FREET4", "T3FREE", "THYROIDAB" in the last 72 hours. Anemia Panel: No results for input(s): "VITAMINB12", "FOLATE", "FERRITIN", "TIBC", "IRON", "RETICCTPCT" in the last 72 hours. Sepsis Labs: No results for input(s): "PROCALCITON", "LATICACIDVEN" in the last 168 hours.  Recent Results (from the past 240 hour(s))  MRSA Next Gen by PCR, Nasal     Status: None   Collection Time: 02/17/23  2:16 AM   Specimen:  Nasal Mucosa; Nasal Swab  Result Value Ref Range Status   MRSA by PCR Next Gen NOT DETECTED NOT DETECTED Final    Comment: (NOTE) The GeneXpert MRSA Assay (FDA approved for NASAL specimens only), is one component of a comprehensive MRSA colonization surveillance program. It is not intended to diagnose MRSA infection nor to guide or monitor treatment for MRSA infections. Test performance is not FDA approved in patients less than 36 years old. Performed at [facility], [address], [place], [place] [address]          Radiology Studies: No results found.      Scheduled Meds:  Chlorhexidine Gluconate Cloth  6 each Topical Daily   heparin  5,000 Units Subcutaneous Q8H   insulin aspart  0-15 Units Subcutaneous Q4H   insulin glargine-yfgn  12 Units Subcutaneous Daily   mouth rinse  15 mL Mouth Rinse 4 times per day   potassium chloride  40 mEq Oral Once   Continuous Infusions:  sodium chloride 75 mL/hr at 02/17/23 1836     LOS: 0 days  Time spent: 35 minutes    Nikolette Reindl Hoover Brunette, DO [facility]  If 7PM-7AM, please contact night-coverage [email or web address] 02/18/2023, 8:53 AM

## 2023-02-19 DIAGNOSIS — E1110 Type 2 diabetes mellitus with ketoacidosis without coma: Secondary | ICD-10-CM | POA: Diagnosis not present

## 2023-02-19 LAB — GLUCOSE, CAPILLARY
Glucose-Capillary: 121 mg/dL — ABNORMAL HIGH (ref 70–99)
Glucose-Capillary: 138 mg/dL — ABNORMAL HIGH (ref 70–99)
Glucose-Capillary: 139 mg/dL — ABNORMAL HIGH (ref 70–99)
Glucose-Capillary: 146 mg/dL — ABNORMAL HIGH (ref 70–99)
Glucose-Capillary: 159 mg/dL — ABNORMAL HIGH (ref 70–99)
Glucose-Capillary: 215 mg/dL — ABNORMAL HIGH (ref 70–99)
Glucose-Capillary: 217 mg/dL — ABNORMAL HIGH (ref 70–99)
Glucose-Capillary: 57 mg/dL — ABNORMAL LOW (ref 70–99)
Glucose-Capillary: 65 mg/dL — ABNORMAL LOW (ref 70–99)
Glucose-Capillary: 67 mg/dL — ABNORMAL LOW (ref 70–99)
Glucose-Capillary: 81 mg/dL (ref 70–99)
Glucose-Capillary: 90 mg/dL (ref 70–99)

## 2023-02-19 LAB — BASIC METABOLIC PANEL
Anion gap: 7 (ref 5–15)
BUN: 38 mg/dL — ABNORMAL HIGH (ref 8–23)
CO2: 29 mmol/L (ref 22–32)
Calcium: 7.8 mg/dL — ABNORMAL LOW (ref 8.9–10.3)
Chloride: 102 mmol/L (ref 98–111)
Creatinine, Ser: 1.64 mg/dL — ABNORMAL HIGH (ref 0.44–1.00)
GFR, Estimated: 30 mL/min — ABNORMAL LOW (ref >60)
Glucose, Bld: 149 mg/dL — ABNORMAL HIGH (ref 70–99)
Potassium: 3 mmol/L — ABNORMAL LOW (ref 3.5–5.1)
Sodium: 138 mmol/L (ref 135–145)

## 2023-02-19 LAB — CBC
HCT: 32.8 % — ABNORMAL LOW (ref 36.0–46.0)
Hemoglobin: 10.7 g/dL — ABNORMAL LOW (ref 12.0–15.0)
MCH: 29.9 pg (ref 26.0–34.0)
MCHC: 32.6 g/dL (ref 30.0–36.0)
MCV: 91.6 fL (ref 80.0–100.0)
Platelets: 177 10*3/uL (ref 150–400)
RBC: 3.58 MIL/uL — ABNORMAL LOW (ref 3.87–5.11)
RDW: 14.5 % (ref 11.5–15.5)
WBC: 8.6 10*3/uL (ref 4.0–10.5)
nRBC: 0 % (ref 0.0–0.2)

## 2023-02-19 LAB — MAGNESIUM: Magnesium: 2.1 mg/dL (ref 1.7–2.4)

## 2023-02-19 MED ORDER — INSULIN ASPART 100 UNIT/ML IJ SOLN
0.0000 [IU] | Freq: Three times a day (TID) | INTRAMUSCULAR | Status: DC
  Administered 2023-02-19: 3 [IU] via SUBCUTANEOUS
  Administered 2023-02-19: 2 [IU] via SUBCUTANEOUS
  Administered 2023-02-20: 3 [IU] via SUBCUTANEOUS
  Administered 2023-02-20: 1 [IU] via SUBCUTANEOUS
  Administered 2023-02-20: 5 [IU] via SUBCUTANEOUS
  Administered 2023-02-21 (×2): 3 [IU] via SUBCUTANEOUS

## 2023-02-19 MED ORDER — POTASSIUM CHLORIDE CRYS ER 20 MEQ PO TBCR
40.0000 meq | EXTENDED_RELEASE_TABLET | Freq: Once | ORAL | Status: AC
  Administered 2023-02-19: 40 meq via ORAL
  Filled 2023-02-19: qty 2

## 2023-02-19 MED ORDER — INSULIN GLARGINE-YFGN 100 UNIT/ML ~~LOC~~ SOLN
6.0000 [IU] | Freq: Every day | SUBCUTANEOUS | Status: DC
  Administered 2023-02-19 – 2023-02-21 (×3): 6 [IU] via SUBCUTANEOUS
  Filled 2023-02-19 (×4): qty 0.06

## 2023-02-19 MED ORDER — ROSUVASTATIN CALCIUM 5 MG PO TABS
5.0000 mg | ORAL_TABLET | Freq: Every day | ORAL | Status: DC
  Administered 2023-02-19 – 2023-02-21 (×3): 5 mg via ORAL
  Filled 2023-02-19 (×3): qty 1

## 2023-02-19 MED ORDER — AMLODIPINE BESYLATE 5 MG PO TABS
5.0000 mg | ORAL_TABLET | Freq: Every day | ORAL | Status: DC
  Administered 2023-02-19 – 2023-02-21 (×3): 5 mg via ORAL
  Filled 2023-02-19 (×3): qty 1

## 2023-02-19 MED ORDER — SPIRONOLACTONE 25 MG PO TABS
50.0000 mg | ORAL_TABLET | Freq: Every day | ORAL | Status: DC
  Administered 2023-02-19 – 2023-02-21 (×3): 50 mg via ORAL
  Filled 2023-02-19 (×3): qty 2

## 2023-02-19 MED ORDER — AMLODIPINE-OLMESARTAN 5-40 MG PO TABS: 1.0000 | ORAL_TABLET | Freq: Every day | ORAL | Status: DC

## 2023-02-19 MED ORDER — CARVEDILOL 12.5 MG PO TABS
12.5000 mg | ORAL_TABLET | Freq: Two times a day (BID) | ORAL | Status: DC
  Administered 2023-02-19 – 2023-02-20 (×2): 12.5 mg via ORAL
  Filled 2023-02-19 (×2): qty 1

## 2023-02-19 MED ORDER — INSULIN ASPART 100 UNIT/ML IJ SOLN
2.0000 [IU] | Freq: Three times a day (TID) | INTRAMUSCULAR | Status: DC
  Administered 2023-02-19 – 2023-02-21 (×6): 2 [IU] via SUBCUTANEOUS

## 2023-02-19 MED ORDER — INSULIN GLARGINE-YFGN 100 UNIT/ML ~~LOC~~ SOLN
6.0000 [IU] | Freq: Every day | SUBCUTANEOUS | Status: DC
  Filled 2023-02-19 (×2): qty 0.06

## 2023-02-19 MED ORDER — IRBESARTAN 150 MG PO TABS
300.0000 mg | ORAL_TABLET | Freq: Every day | ORAL | Status: DC
  Administered 2023-02-19 – 2023-02-21 (×3): 300 mg via ORAL
  Filled 2023-02-19 (×3): qty 2

## 2023-02-19 MED ORDER — INSULIN ASPART 100 UNIT/ML IJ SOLN: 0.0000 [IU] | Freq: Every day | INTRAMUSCULAR | Status: DC

## 2023-02-19 NOTE — TOC Initial Note (Signed)
Transition of Care [facility]) - Initial/Assessment Note    Patient Details  Name: Emily Jennings MRN: [id number] Date of Birth: 26-Oct-1933  Transition of Care [facility]) CM/SW Contact:    Karn Cassis, LCSW Phone Number: 02/19/2023, 8:07 AM  Clinical Narrative:  Pt admitted for DKA. Assessment completed due to high risk readmission score. Pt reports she lives with her husband and is fairly independent with ADLs. She has a cane but does not typically use. Pt drives herself to appointments or her husband will take her. She plans to return home when medically stable. No home health services prior to admission. Pt  reports no needs at this time. TOC will continue to follow.                  Expected Discharge Plan: Home/Self Care Barriers to Discharge: Continued Medical Work up   Patient Goals and CMS Choice Patient states their goals for this hospitalization and ongoing recovery are:: return home   Choice offered to / list presented to : Patient [place] ownership interest in [facility].provided to::  (n/a)    Expected Discharge Plan and Services In-house Referral: Clinical Social Work     Living arrangements for the past 2 months: Single Family Home                                      Prior Living Arrangements/Services Living arrangements for the past 2 months: Single Family Home Lives with:: Spouse Patient language and need for interpreter reviewed:: Yes Do you feel safe going back to the place where you live?: Yes      Need for Family Participation in Patient Care: No (Comment)   Current home services: DME (cane) Criminal Activity/Legal Involvement Pertinent to Current Situation/Hospitalization: No - Comment as needed  Activities of Daily Living Home Assistive Devices/Equipment: CBG Meter ADL Screening (condition at time of admission) Patient's cognitive ability adequate to safely complete daily activities?: Yes Is the patient deaf or have difficulty  hearing?: Yes Does the patient have difficulty seeing, even when wearing glasses/contacts?: No Does the patient have difficulty concentrating, remembering, or making decisions?: No Patient able to express need for assistance with ADLs?: Yes Does the patient have difficulty dressing or bathing?: No Independently performs ADLs?: Yes (appropriate for developmental age) Does the patient have difficulty walking or climbing stairs?: No Weakness of Legs: None Weakness of Arms/Hands: None  Permission Sought/Granted                  Emotional Assessment     Affect (typically observed): Appropriate Orientation: : Oriented to Self, Oriented to Place, Oriented to Situation, Oriented to  Time Alcohol / Substance Use: Not Applicable Psych Involvement: No (comment)  Admission diagnosis:  DKA (diabetic ketoacidosis) (HCC) [E11.10] Diabetic ketoacidosis without coma associated with type 1 diabetes mellitus (HCC) [E10.10] Patient Active Problem List   Diagnosis Date Noted   DKA (diabetic ketoacidosis) (HCC) 02/17/2023   High anion gap metabolic acidosis 02/17/2023   Pseudohyponatremia 02/17/2023   Hypoalbuminemia due to protein-calorie malnutrition (HCC) 02/17/2023   Acute kidney injury superimposed on chronic kidney disease (HCC) 02/17/2023   Dehydration 02/17/2023   Hip pain 02/15/2023   Encounter for Medicare annual examination with abnormal findings 10/16/2022   Vitamin D deficiency 10/16/2022   Acute exacerbation of CHF (congestive heart failure) (HCC) 09/13/2022   Acute on chronic congestive heart failure with left  ventricular diastolic dysfunction (HCC) 09/13/2022   Chronic kidney disease (CKD) stage G3b/A2, moderately decreased glomerular filtration rate (GFR) between 30-44 mL/min/1.73 square meter and albuminuria creatinine ratio between 30-299 mg/g (HCC) 09/13/2022   Mild aortic valve stenosis 09/13/2022   Dyspnea on exertion 09/05/2022   CHF (congestive heart failure) (HCC)  09/05/2022   Acute on chronic diastolic (congestive) heart failure (HCC) 09/05/2022   Hyperkalemia 04/09/2022   Hyperglycemic crisis due to diabetes mellitus (HCC) 03/25/2022   Hyponatremia 03/25/2022   Acute metabolic encephalopathy 03/22/2022   Hypothermia 03/22/2022   Heart murmur 03/22/2022   Bilateral lower extremity edema 03/22/2022   Syncope and collapse 01/13/2021   Lip swelling 10/30/2020   Right leg pain 10/30/2020   Tubular adenoma of colon 10/26/2017   Osteoporosis 07/28/2014   Diabetic hypertension-nephrosis syndrome (HCC) 03/23/2014   Stage 3b chronic kidney disease (HCC) 03/23/2014   Type 2 diabetes mellitus with stage 3a chronic kidney disease, with long-term current use of insulin (HCC) 09/29/2013   Allergic rhinitis 01/27/2013   GERD (gastroesophageal reflux disease) 01/12/2013   CVD (cardiovascular disease) 07/17/2011   Hypokalemia 08/31/2008   Hyperlipidemia LDL goal <100 07/15/2007   Essential hypertension 07/15/2007   PCP:  [name], MD Pharmacy:   [phone] [place], [place] - [address] AT [facility] [address] [place] [address] Phone: [phone] Fax: [phone]  [facility] Delivery) - [place], [place] - [address] [facility] [address] [place] [address] [place] [address] Phone: [phone] Fax: [phone]     Social Determinants of Health (SDOH) Social History: SDOH Screenings   Food Insecurity: No Food Insecurity (02/17/2023)  Housing: Low Risk  (02/17/2023)  Transportation Needs: No Transportation Needs (02/17/2023)  Utilities: Not At Risk (02/17/2023)  Alcohol Screen: Low Risk  (11/15/2022)  Depression (PHQ2-9): Low Risk  (02/15/2023)  Financial Resource Strain: Low Risk  (12/28/2022)  Physical Activity: Sufficiently Active (11/15/2022)  Social Connections: Moderately Integrated (11/15/2022)  Stress: No Stress Concern Present (12/28/2022)  Tobacco Use: Low Risk  (02/17/2023)   SDOH  Interventions:     Readmission Risk Interventions    02/19/2023    8:06 AM  Readmission Risk Prevention Plan  Transportation Screening Complete  HRI or Home Care Consult Complete  Social Work Consult for Recovery Care Planning/Counseling Complete  Palliative Care Screening Not Applicable  Medication Review [employer]) Complete

## 2023-02-19 NOTE — Inpatient Diabetes Management (Addendum)
Inpatient Diabetes Program Recommendations  AACE/ADA: New Consensus Statement on Inpatient Glycemic Control  Target Ranges:  Prepandial:   less than 140 mg/dL      Peak postprandial:   less than 180 mg/dL (1-2 hours)      Critically ill patients:  140 - 180 mg/dL    Latest Reference Range & Units 02/19/23 01:21 02/19/23 04:04 02/19/23 05:22 02/19/23 07:17  Glucose-Capillary 70 - 99 mg/dL 132 (H) 440 (H) 102 (H) 65 (L)     Latest Reference Range & Units 02/18/23 08:08 02/18/23 11:08 02/18/23 15:35 02/18/23 19:31 02/18/23 19:32 02/18/23 20:12 02/18/23 23:28  Glucose-Capillary 70 - 99 mg/dL 725 (H) 366 (H) 440 (H) 46 (L) 48 (L) 71 68 (L)     Latest Reference Range & Units 02/17/23 00:28  CO2 22 - 32 mmol/L 16 (L)  Glucose 70 - 99 mg/dL 347 (HH)  Anion gap 5 - 15  31 (H)    Latest Reference Range & Units 02/17/23 02:11  Beta-Hydroxybutyric Acid 0.05 - 0.27 mmol/L >8.00 (H)   Review of Glycemic Control  Diabetes history: DM2 Outpatient Diabetes medications: Lanuts 12 units daily, Farxiga 10 mg QAM Current orders for Inpatient glycemic control: Semglee 12 units daily, Novolog 0-15 units Q4H  Inpatient Diabetes Program Recommendations:    Insulin: Please consider decreasing Semglee to 6 units daily, decreasing Novolog correction to 0-9 units Q4H and adding Novolog 2 units TID with meals for meal coverage if patient eats at least 50% of meals.  Outpatient DM: May want to consider discontinuing Farxiga outpatient given increased risk of DKA (especially given patient presented in DKA).  NOTE: Noted patient seen Dr. Lonzo Jennings (Endocrinologist) on 02/16/23. Per office note on 02/16/23, patient was having hypoglycemia so mixed insulin was stopped (was taking Humalog 75/25 6-7 units BID) and started Lantus 12 units daily and continue Farxiga 10 mg daily (Farxiga was prescribed on 11/10/22 by nephrologist).  Patient admitted on 02/17/23 with DKA and treated with IV insulin. Patient is ordered Semglee  12 units daily and is now experiencing hypoglycemia.   Addendum 02/19/23@13 :50-Spoke with patient over the phone regarding DM control. Patient states that she starting seeing Dr. Lonzo Jennings in April 2024 and her insulin was changed. Patient states that she had been on Huamlog 75/25 insulin but it was changed to "an insulin that starts with a P" and she notes that she was taking 6 units QAM and 8 units QPM. Patient states that the doses of that insulin were too much and she could not take those dosages because it caused her glucose to go to low. Inquired about Marcelline Deist and patient states that she only takes insulin for her DM. Discussed Marcelline Deist and explained that according to the chart, her kidney doctor prescribed the medication. She states that if the kidney doctor prescribed it then she was most likely taking it. Patient reports that when she seen Dr. Lonzo Jennings on 02/16/23 that her insulin was changed to Lantus 12 units daily but she never got to take it since she came to the hospital that night. Patient reports that she got the Lantus insulin filled and she has it at home. Patient reports that she is using a Dexcom G7 CGM sensor and reader for glucose monitoring. Inquired about frequency of hypoglycemia and patient reported that it was mostly high over the past week because the other insulin dosages was too much for her. In talking with patient , still unclear as to whether patient was taking any insulin for week prior  to admission. Discussed that she experienced hypoglycemia yesterday and today so the long acting insulin was decreased. Explained that the Lantus dose may be changed as well at discharge. Discussed that Marcelline Deist may also be stopped but encouraged patient to pay close attention to discharge instructions so she will know exactly what to change. Patient states that she plans to follow up with Dr. Lonzo Jennings as well regarding DM. Patient verbalized understanding of information and has no questions at  this time. Will plan to follow up with patient in person tomorrow if patient remains inpatient.   Thanks, Emily Penner, RN, MSN, CDCES Diabetes Coordinator Inpatient Diabetes Program [phone] (Team Pager from 8am to 5pm)

## 2023-02-19 NOTE — Progress Notes (Signed)
Patient noted with blood glucose level <70 X 2 throughout night. Provider made aware. Able to increase blood glucose levels with po intake. Asymptomatic hypoglycemia episodes. Ordered to monitor blood glucose closely.

## 2023-02-19 NOTE — Progress Notes (Signed)
Mobility Specialist Progress Note:   02/19/23 1409  Mobility  Activity Ambulated with assistance in hallway  Level of Assistance Contact guard assist, steadying assist  Assistive Device Other (Comment) (HHA)  Distance Ambulated (ft) 40 ft  Range of Motion/Exercises Active;All extremities  Activity Response Tolerated well  Mobility Referral Yes  $Mobility charge 1 Mobility  Mobility Specialist Start Time (ACUTE ONLY) 1340  Mobility Specialist Stop Time (ACUTE ONLY) 1409  Mobility Specialist Time Calculation (min) (ACUTE ONLY) 29 min   Pt received EOB nurse at bedside. Pt agreeable to mobility session. Required HHA and CGA for safety. Tolerated well, c/o "feeling unbalanced". Returned pt to room, nurse at bedside, all needs met.   Feliciana Rossetti Mobility Specialist Please contact via Special educational needs teacher or  Rehab office at [phone]

## 2023-02-19 NOTE — Progress Notes (Signed)
PROGRESS NOTE    Emily Jennings  MVH:[id number] DOB: 08/07/1934 DOA: 02/16/2023 PCP: [name], MD   Brief Narrative:  Emily Jennings is a 89 y.o. female with medical history significant of diabetes mellitus, hypertension, hyperlipidemia who presents to the emergency department after being brought by the husband for evaluation of elevated blood glucose level.  Patient states that there was a recent change in her insulin which resulted in having nausea, vomiting, several episodes of loose bowel movements after taking the new insulin.    Patient was admitted with DKA as well as severe hypokalemia in the setting of GI loss as well as AKI on CKD stage IV.  She has been transitioned off of IV insulin, but continues to have AKI that has now improved on 6/3.  She continues to have labile blood glucose readings as well as elevated blood pressures.  Assessment & Plan:   Principal Problem:   DKA (diabetic ketoacidosis) (HCC) Active Problems:   Essential hypertension   Hyperlipidemia LDL goal <100   Hypokalemia   High anion gap metabolic acidosis   Pseudohyponatremia   Hypoalbuminemia due to protein-calorie malnutrition (HCC)   Acute kidney injury superimposed on chronic kidney disease (HCC)   Dehydration  Assessment and Plan:   High Anion gap metabolic acidosis secondary to DKA-resolved Hyperglycemia secondary to poorly controlled type 2 diabetes mellitus Awaiting A1c Appreciate diabetes coordinator recommendations Continue to monitor   Pseudohyponatremia Monitor normal saline   Hypokalemia Replete and reevaluate in a.m.   Hypoalbuminemia possibly secondary to mild protein calorie malnutrition Albumin 3.3, consider protein supplement when patient resumes oral intake   Acute kidney injury on CKD 4-resolved Dehydration Creatinine 2.43 on admission (baseline creatinine at 1.4-1.7) Continue IV hydration Renally adjust medications, avoid nephrotoxic  agents/dehydration/hypotension Renal ultrasound with nephrolithiasis and mild left hydronephrosis, follow-up with urology outpatient   Essential hypertension-elevated Continue IV hydralazine 10 mg every 6 hours as needed for SBP > 170 Resumed home blood pressure medications today Discontinue IV fluid   Mixed hyperlipidemia Consider starting patient's home meds when she resumes oral intake   DVT prophylaxis: Heparin Code Status: Full Family Communication: Husband at bedside 6/1 Disposition Plan:  Status is: Inpatient Remains inpatient appropriate because: Ongoing need for IV medications.    Consultants:  None  Procedures:  None  Antimicrobials:  None   Subjective: Patient seen and evaluated today with no new acute complaints or concerns. No acute concerns or events noted overnight.  She continues to have poor appetite and has very little oral intake.  Denies any nausea or vomiting or abdominal pain.  Objective: Vitals:   02/18/23 1341 02/18/23 1936 02/19/23 0359 02/19/23 1306  BP: (!) 164/71 (!) 146/70 (!) 167/77 (!) 183/101  Pulse: 80 81 74 92  Resp: 18 16 16 18   Temp: 98.3 F (36.8 C) 98.4 F (36.9 C) 98.6 F (37 C) 98.3 F (36.8 C)  TempSrc: Oral Oral Oral   SpO2: 97% 96% 97% 98%  Weight:      Height:        Intake/Output Summary (Last 24 hours) at 02/19/2023 1313 Last data filed at 02/19/2023 0838 Gross per 24 hour  Intake 1981.25 ml  Output --  Net 1981.25 ml   Filed Weights   02/17/23 0002 02/17/23 0253 02/17/23 0449  Weight: 52.6 kg 52.7 kg 53.8 kg    Examination:  General exam: Appears calm and comfortable  Respiratory system: Clear to auscultation. Respiratory effort normal. Cardiovascular system: S1 & S2 heard,  RRR.  Gastrointestinal system: Abdomen is soft Central nervous system: Alert and awake Extremities: No edema Skin: No significant lesions noted Psychiatry: Flat affect.    Data Reviewed: I have personally reviewed following labs  and imaging studies  CBC: Recent Labs  Lab 02/17/23 0028 02/17/23 0501 02/18/23 0429 02/19/23 0500  WBC 9.5 10.0 14.0* 8.6  NEUTROABS 8.1*  --   --   --   HGB 12.2 9.9* 10.8* 10.7*  HCT 37.2 30.9* 32.9* 32.8*  MCV 92.5 91.7 90.9 91.6  PLT 242 196 198 177   Basic Metabolic Panel: Recent Labs  Lab 02/17/23 0501 02/17/23 1019 02/18/23 0429 02/18/23 1431 02/19/23 0500  NA 131* 131* 132* 132* 138  K 2.3* 3.0* 3.4* 3.5 3.0*  CL 91* 91* 94* 97* 102  CO2 23 26 28 28 29   GLUCOSE 365* 262* 259* 187* 149*  BUN 48* 50* 53* 48* 38*  CREATININE 2.24* 2.27* 2.34* 2.20* 1.64*  CALCIUM 7.9* 8.0* 8.1* 7.9* 7.8*  MG 2.2  --  2.3  --  2.1  PHOS 3.4  --   --   --   --    GFR: Estimated Creatinine Clearance: 17.7 mL/min (A) (by C-G formula based on SCr of 1.64 mg/dL (H)). Liver Function Tests: Recent Labs  Lab 02/17/23 0028  AST 49*  ALT 29  ALKPHOS 90  BILITOT 1.9*  PROT 6.2*  ALBUMIN 3.3*   No results for input(s): "LIPASE", "AMYLASE" in the last 168 hours. No results for input(s): "AMMONIA" in the last 168 hours. Coagulation Profile: No results for input(s): "INR", "PROTIME" in the last 168 hours. Cardiac Enzymes: No results for input(s): "CKTOTAL", "CKMB", "CKMBINDEX", "TROPONINI" in the last 168 hours. BNP (last 3 results) No results for input(s): "PROBNP" in the last 8760 hours. HbA1C: No results for input(s): "HGBA1C" in the last 72 hours. CBG: Recent Labs  Lab 02/19/23 0522 02/19/23 0717 02/19/23 0826 02/19/23 0851 02/19/23 1119  GLUCAP 146* 65* 57* 81 215*   Lipid Profile: No results for input(s): "CHOL", "HDL", "LDLCALC", "TRIG", "CHOLHDL", "LDLDIRECT" in the last 72 hours. Thyroid Function Tests: No results for input(s): "TSH", "T4TOTAL", "FREET4", "T3FREE", "THYROIDAB" in the last 72 hours. Anemia Panel: No results for input(s): "VITAMINB12", "FOLATE", "FERRITIN", "TIBC", "IRON", "RETICCTPCT" in the last 72 hours. Sepsis Labs: No results for input(s):  "PROCALCITON", "LATICACIDVEN" in the last 168 hours.  Recent Results (from the past 240 hour(s))  MRSA Next Gen by PCR, Nasal     Status: None   Collection Time: 02/17/23  2:16 AM   Specimen: Nasal Mucosa; Nasal Swab  Result Value Ref Range Status   MRSA by PCR Next Gen NOT DETECTED NOT DETECTED Final    Comment: (NOTE) The GeneXpert MRSA Assay (FDA approved for NASAL specimens only), is one component of a comprehensive MRSA colonization surveillance program. It is not intended to diagnose MRSA infection nor to guide or monitor treatment for MRSA infections. Test performance is not FDA approved in patients less than 54 years old. Performed at [facility], [address], [place], [place] [address]          Radiology Studies: US RENAL  Result Date: 02/18/2023 CLINICAL DATA:  Acute renal insufficiency EXAM: RENAL / URINARY TRACT ULTRASOUND COMPLETE COMPARISON:  October 11, 2022 FINDINGS: Right Kidney: Renal measurements: 9.8 x 3.6 x 5.3 cm = volume: 97 mL. There appear to be several nonobstructive stones in the right kidney with the largest measuring 4 mm. Increased cortical echogenicity. There appear to  be multiple cysts in the right kidney with the largest measuring 3.8 cm. No follow-up imaging recommended for the cysts. Left Kidney: Renal measurements: 9.8 x 3.7 x 4.4 cm = volume: 83 mL. Increased cortical echogenicity. There appear to be stones in the left kidney with the largest measuring 10 mm. Mild hydronephrosis. There is a dominant cyst in the left kidney measuring 3.2 cm. No follow-up imaging recommended for the cyst. Bladder: Appears normal for degree of bladder distention. Other: None. IMPRESSION: 1. The study is limited due to shadowing bowel gas. 2. Nonobstructive stones in the right kidney. 3. Stones in the left kidney with the largest measuring 10 mm. Mild left hydronephrosis. Electronically Signed   By: Gerome Sam III M.D.   On: 02/18/2023 11:58        Scheduled  Meds:  amLODipine  5 mg Oral Daily   And   irbesartan  300 mg Oral Daily   carvedilol  12.5 mg Oral BID WC   Chlorhexidine Gluconate Cloth  6 each Topical Daily   heparin  5,000 Units Subcutaneous Q8H   insulin aspart  0-5 Units Subcutaneous QHS   insulin aspart  0-9 Units Subcutaneous TID WC   insulin aspart  2 Units Subcutaneous TID WC   insulin glargine-yfgn  6 Units Subcutaneous Daily   mouth rinse  15 mL Mouth Rinse 4 times per day   polyethylene glycol  17 g Oral Daily   potassium chloride  40 mEq Oral Once   rosuvastatin  5 mg Oral Daily     LOS: 1 day    Time spent: 35 minutes    Paysley Poplar Hoover Brunette, DO [facility]  If 7PM-7AM, please contact night-coverage [email or web address] 02/19/2023, 1:13 PM

## 2023-02-20 ENCOUNTER — Encounter (HOSPITAL_COMMUNITY): Payer: Self-pay | Admitting: Internal Medicine

## 2023-02-20 DIAGNOSIS — E1110 Type 2 diabetes mellitus with ketoacidosis without coma: Secondary | ICD-10-CM | POA: Diagnosis not present

## 2023-02-20 LAB — CBC
HCT: 35.8 % — ABNORMAL LOW (ref 36.0–46.0)
Hemoglobin: 11.6 g/dL — ABNORMAL LOW (ref 12.0–15.0)
MCH: 30.4 pg (ref 26.0–34.0)
MCHC: 32.4 g/dL (ref 30.0–36.0)
MCV: 94 fL (ref 80.0–100.0)
Platelets: 178 10*3/uL (ref 150–400)
RBC: 3.81 MIL/uL — ABNORMAL LOW (ref 3.87–5.11)
RDW: 14.5 % (ref 11.5–15.5)
WBC: 7.4 10*3/uL (ref 4.0–10.5)
nRBC: 0 % (ref 0.0–0.2)

## 2023-02-20 LAB — BASIC METABOLIC PANEL
Anion gap: 8 (ref 5–15)
BUN: 32 mg/dL — ABNORMAL HIGH (ref 8–23)
CO2: 26 mmol/L (ref 22–32)
Calcium: 8 mg/dL — ABNORMAL LOW (ref 8.9–10.3)
Chloride: 103 mmol/L (ref 98–111)
Creatinine, Ser: 1.4 mg/dL — ABNORMAL HIGH (ref 0.44–1.00)
GFR, Estimated: 37 mL/min — ABNORMAL LOW (ref >60)
Glucose, Bld: 227 mg/dL — ABNORMAL HIGH (ref 70–99)
Potassium: 3.6 mmol/L (ref 3.5–5.1)
Sodium: 137 mmol/L (ref 135–145)

## 2023-02-20 LAB — MAGNESIUM: Magnesium: 2.1 mg/dL (ref 1.7–2.4)

## 2023-02-20 LAB — GLUCOSE, CAPILLARY
Glucose-Capillary: 167 mg/dL — ABNORMAL HIGH (ref 70–99)
Glucose-Capillary: 287 mg/dL — ABNORMAL HIGH (ref 70–99)
Glucose-Capillary: 218 mg/dL — ABNORMAL HIGH (ref 70–99)
Glucose-Capillary: 141 mg/dL — ABNORMAL HIGH (ref 70–99)
Glucose-Capillary: 146 mg/dL — ABNORMAL HIGH (ref 70–99)

## 2023-02-20 MED ORDER — ASPIRIN 81 MG PO TBEC
81.0000 mg | DELAYED_RELEASE_TABLET | Freq: Every day | ORAL | Status: DC
  Administered 2023-02-21: 81 mg via ORAL
  Filled 2023-02-20: qty 1

## 2023-02-20 MED ORDER — CARVEDILOL 12.5 MG PO TABS
25.0000 mg | ORAL_TABLET | Freq: Two times a day (BID) | ORAL | Status: DC
  Administered 2023-02-20 – 2023-02-21 (×2): 25 mg via ORAL
  Filled 2023-02-20 (×2): qty 2

## 2023-02-20 MED ORDER — HYDRALAZINE HCL 20 MG/ML IJ SOLN
10.0000 mg | Freq: Once | INTRAMUSCULAR | Status: AC
  Administered 2023-02-20: 10 mg via INTRAVENOUS
  Filled 2023-02-20: qty 1

## 2023-02-20 MED ORDER — NICARDIPINE HCL IN NACL 20-0.86 MG/200ML-% IV SOLN
3.0000 mg/h | INTRAVENOUS | Status: DC
  Administered 2023-02-20: 5 mg/h via INTRAVENOUS
  Administered 2023-02-20: 4 mg/h via INTRAVENOUS
  Filled 2023-02-20 (×3): qty 200

## 2023-02-20 MED ORDER — FUROSEMIDE 40 MG PO TABS
40.0000 mg | ORAL_TABLET | Freq: Two times a day (BID) | ORAL | Status: DC
  Administered 2023-02-20 – 2023-02-21 (×2): 40 mg via ORAL
  Filled 2023-02-20 (×2): qty 1

## 2023-02-20 NOTE — Inpatient Diabetes Management (Signed)
Inpatient Diabetes Program Recommendations  AACE/ADA: New Consensus Statement on Inpatient Glycemic Control   Target Ranges:  Prepandial:   less than 140 mg/dL      Peak postprandial:   less than 180 mg/dL (1-2 hours)      Critically ill patients:  140 - 180 mg/dL    Latest Reference Range & Units 02/20/23 03:45 02/20/23 07:26 02/20/23 10:53 02/20/23 11:21  Glucose-Capillary 70 - 99 mg/dL 562 (H) 130 (H)  Novolog 7 units      Semglee 6 units 218 (H)  Novolog 5 units    Latest Reference Range & Units 02/19/23 08:26 02/19/23 08:51 02/19/23 11:19 02/19/23 15:19 02/19/23 16:27 02/19/23 20:02 02/19/23 21:09 02/19/23 23:45  Glucose-Capillary 70 - 99 mg/dL 57 (L) 81 865 (H)  Novolog 5 units @13 :04  Semglee 6 units @13 :52 217 (H) 159 (H)  Novolog 2 units @18 :11 67 (L) 90 121 (H)   Review of Glycemic Control  Diabetes history: DM2 Outpatient Diabetes medications: Lanuts 12 units daily, Farxiga 10 mg QAM Current orders for Inpatient glycemic control: Semglee 6 units daily, Novolog 0-9 units TID with meals, Novolog 0-5 units QHS, Novolog 2 units TID with meals  NOTE: Spoke with patient and her husband at bedside. Patient and her husband both confirm that patient got Lantus insulin filled and she has it at home. Discussed Marcelline Deist, how it works, and risk of causing DKA. Explained that Marcelline Deist would likely be discontinued at discharge and that she needed to be sure to pay attention to what dose of Lantus she needs to take in case it is changed. Encouraged patient to check glucose frequently at home using Dexcom G7 sensor and reader. Discussed hypoglycemia and treatment. Encouraged patient to reach out to Dr. Harvel Ricks office if she has any issues with hypoglycemia at home or if glucose is consistently staying over 200 mg/dl.  Patient verbalized understanding of information and has no questions at this time.   Thanks, Orlando Penner, RN, MSN, CDCES Diabetes Coordinator Inpatient Diabetes  Program [phone] (Team Pager from 8am to 5pm)

## 2023-02-20 NOTE — Plan of Care (Signed)

## 2023-02-20 NOTE — Progress Notes (Signed)
Please inform patient, Xray showed hematoma on right hip Rest and protect the bruised area. Put ice or a cold pack on the area for 10 to 20 minutes at a time. Prop up the bruised area on a pillow when you ice it or anytime you sit or lie down during the.Try to keep it above the level of your heart.

## 2023-02-20 NOTE — Progress Notes (Signed)
RN called due to persistent patient's BP in the 190s despite IV hydralazine treatment.  Patient was started on nicardipine drip and transferred to stepdown unit for closer monitoring.

## 2023-02-20 NOTE — Progress Notes (Signed)
PROGRESS NOTE    MORELIA CLUCK  XBM:[id number] DOB: 21-Apr-1934 DOA: 02/16/2023 PCP: [name], MD   Brief Narrative:  DYSTANY SHUEMAKE is a 89 y.o. female with medical history significant of diabetes mellitus, hypertension, hyperlipidemia who presents to the emergency department after being brought by the husband for evaluation of elevated blood glucose level.  Patient states that there was a recent change in her insulin which resulted in having nausea, vomiting, several episodes of loose bowel movements after taking the new insulin.    Patient was admitted with DKA as well as severe hypokalemia in the setting of GI loss as well as AKI on CKD stage IV.  She has been transitioned off of IV insulin, but continues to have AKI that has now improved on 6/3.  She continues to have labile blood glucose readings as well as elevated blood pressures and required transfer to ICU overnight for Cardene drip initiation due to elevated blood pressure readings.  Assessment & Plan:   Principal Problem:   DKA (diabetic ketoacidosis) (HCC) Active Problems:   Essential hypertension   Hyperlipidemia LDL goal <100   Hypokalemia   High anion gap metabolic acidosis   Pseudohyponatremia   Hypoalbuminemia due to protein-calorie malnutrition (HCC)   Acute kidney injury superimposed on chronic kidney disease (HCC)   Dehydration  Assessment and Plan:   High Anion gap metabolic acidosis secondary to DKA-resolved Hyperglycemia secondary to poorly controlled type 2 diabetes mellitus Appreciate diabetes coordinator recommendations and plan to discontinue Farxiga at home and possibly decrease Semglee dosing as well Continue to monitor  Hypertensive urgency in setting of essential hypertension Currently on Cardene drip Continue home medications as prescribed and resume home Lasix at this point as well due to improved kidney function   Hypoalbuminemia possibly secondary to mild protein calorie malnutrition    Acute kidney injury on CKD 4-resolved Dehydration Creatinine 2.43 on admission (baseline creatinine at 1.4-1.7) Continue IV hydration Renally adjust medications, avoid nephrotoxic agents/dehydration/hypotension Renal ultrasound with nephrolithiasis and mild left hydronephrosis, follow-up with urology outpatient   Mixed hyperlipidemia Continue statin   DVT prophylaxis: Heparin Code Status: Full Family Communication: Husband at bedside 6/1 Disposition Plan:  Status is: Inpatient Remains inpatient appropriate because: Ongoing need for IV medications.    Consultants:  None  Procedures:  None  Antimicrobials:  None   Subjective: Patient seen and evaluated today with no new acute complaints or concerns. No acute concerns or events noted overnight.  She continues to have poor appetite and has very little oral intake.  Denies any nausea or vomiting or abdominal pain.  Objective: Vitals:   02/20/23 0600 02/20/23 0700 02/20/23 0732 02/20/23 0800  BP: (!) 164/70 (!) 148/58 (!) 162/60 123/76  Pulse: (!) 105 (!) 107 (!) 115 (!) 110  Resp: 13 15 (!) 22 20  Temp:   98.6 F (37 C)   TempSrc:   Oral   SpO2: 97% 95% 96% 95%  Weight:      Height:        Intake/Output Summary (Last 24 hours) at 02/20/2023 1004 Last data filed at 02/20/2023 0800 Gross per 24 hour  Intake 461.89 ml  Output 500 ml  Net -38.11 ml   Filed Weights   02/17/23 0253 02/17/23 0449 02/20/23 0450  Weight: 52.7 kg 53.8 kg 65.7 kg    Examination:  General exam: Appears calm and comfortable  Respiratory system: Clear to auscultation. Respiratory effort normal. Cardiovascular system: S1 & S2 heard, RRR.  Gastrointestinal system:  Abdomen is soft Central nervous system: Alert and awake Extremities: No edema Skin: No significant lesions noted Psychiatry: Flat affect.    Data Reviewed: I have personally reviewed following labs and imaging studies  CBC: Recent Labs  Lab 02/17/23 0028 02/17/23 0501  02/18/23 0429 02/19/23 0500 02/20/23 0436  WBC 9.5 10.0 14.0* 8.6 7.4  NEUTROABS 8.1*  --   --   --   --   HGB 12.2 9.9* 10.8* 10.7* 11.6*  HCT 37.2 30.9* 32.9* 32.8* 35.8*  MCV 92.5 91.7 90.9 91.6 94.0  PLT 242 196 198 177 178   Basic Metabolic Panel: Recent Labs  Lab 02/17/23 0501 02/17/23 1019 02/18/23 0429 02/18/23 1431 02/19/23 0500 02/20/23 0436  NA 131* 131* 132* 132* 138 137  K 2.3* 3.0* 3.4* 3.5 3.0* 3.6  CL 91* 91* 94* 97* 102 103  CO2 23 26 28 28 29 26   GLUCOSE 365* 262* 259* 187* 149* 227*  BUN 48* 50* 53* 48* 38* 32*  CREATININE 2.24* 2.27* 2.34* 2.20* 1.64* 1.40*  CALCIUM 7.9* 8.0* 8.1* 7.9* 7.8* 8.0*  MG 2.2  --  2.3  --  2.1 2.1  PHOS 3.4  --   --   --   --   --    GFR: Estimated Creatinine Clearance: 24.4 mL/min (A) (by C-G formula based on SCr of 1.4 mg/dL (H)). Liver Function Tests: Recent Labs  Lab 02/17/23 0028  AST 49*  ALT 29  ALKPHOS 90  BILITOT 1.9*  PROT 6.2*  ALBUMIN 3.3*   No results for input(s): "LIPASE", "AMYLASE" in the last 168 hours. No results for input(s): "AMMONIA" in the last 168 hours. Coagulation Profile: No results for input(s): "INR", "PROTIME" in the last 168 hours. Cardiac Enzymes: No results for input(s): "CKTOTAL", "CKMB", "CKMBINDEX", "TROPONINI" in the last 168 hours. BNP (last 3 results) No results for input(s): "PROBNP" in the last 8760 hours. HbA1C: No results for input(s): "HGBA1C" in the last 72 hours. CBG: Recent Labs  Lab 02/19/23 2002 02/19/23 2109 02/19/23 2345 02/20/23 0345 02/20/23 0726  GLUCAP 67* 90 121* 167* 287*   Lipid Profile: No results for input(s): "CHOL", "HDL", "LDLCALC", "TRIG", "CHOLHDL", "LDLDIRECT" in the last 72 hours. Thyroid Function Tests: No results for input(s): "TSH", "T4TOTAL", "FREET4", "T3FREE", "THYROIDAB" in the last 72 hours. Anemia Panel: No results for input(s): "VITAMINB12", "FOLATE", "FERRITIN", "TIBC", "IRON", "RETICCTPCT" in the last 72 hours. Sepsis  Labs: No results for input(s): "PROCALCITON", "LATICACIDVEN" in the last 168 hours.  Recent Results (from the past 240 hour(s))  MRSA Next Gen by PCR, Nasal     Status: None   Collection Time: 02/17/23  2:16 AM   Specimen: Nasal Mucosa; Nasal Swab  Result Value Ref Range Status   MRSA by PCR Next Gen NOT DETECTED NOT DETECTED Final    Comment: (NOTE) The GeneXpert MRSA Assay (FDA approved for NASAL specimens only), is one component of a comprehensive MRSA colonization surveillance program. It is not intended to diagnose MRSA infection nor to guide or monitor treatment for MRSA infections. Test performance is not FDA approved in patients less than 34 years old. Performed at [facility], [address], [place], [place] [address]          Radiology Studies: US RENAL  Result Date: 02/18/2023 CLINICAL DATA:  Acute renal insufficiency EXAM: RENAL / URINARY TRACT ULTRASOUND COMPLETE COMPARISON:  October 11, 2022 FINDINGS: Right Kidney: Renal measurements: 9.8 x 3.6 x 5.3 cm = volume: 97 mL. There appear to  be several nonobstructive stones in the right kidney with the largest measuring 4 mm. Increased cortical echogenicity. There appear to be multiple cysts in the right kidney with the largest measuring 3.8 cm. No follow-up imaging recommended for the cysts. Left Kidney: Renal measurements: 9.8 x 3.7 x 4.4 cm = volume: 83 mL. Increased cortical echogenicity. There appear to be stones in the left kidney with the largest measuring 10 mm. Mild hydronephrosis. There is a dominant cyst in the left kidney measuring 3.2 cm. No follow-up imaging recommended for the cyst. Bladder: Appears normal for degree of bladder distention. Other: None. IMPRESSION: 1. The study is limited due to shadowing bowel gas. 2. Nonobstructive stones in the right kidney. 3. Stones in the left kidney with the largest measuring 10 mm. Mild left hydronephrosis. Electronically Signed   By: Gerome Sam III M.D.   On: 02/18/2023  11:58        Scheduled Meds:  amLODipine  5 mg Oral Daily   And   irbesartan  300 mg Oral Daily   carvedilol  12.5 mg Oral BID WC   Chlorhexidine Gluconate Cloth  6 each Topical Daily   heparin  5,000 Units Subcutaneous Q8H   insulin aspart  0-5 Units Subcutaneous QHS   insulin aspart  0-9 Units Subcutaneous TID WC   insulin aspart  2 Units Subcutaneous TID WC   insulin glargine-yfgn  6 Units Subcutaneous Daily   polyethylene glycol  17 g Oral Daily   rosuvastatin  5 mg Oral Daily   spironolactone  50 mg Oral Daily     LOS: 2 days    Time spent: 35 minutes    Candies Palm Hoover Brunette, DO [facility]  If 7PM-7AM, please contact night-coverage [email or web address] 02/20/2023, 10:04 AM

## 2023-02-20 NOTE — Progress Notes (Signed)
Pt's BP rechecked after blood pressure meds reordered with drop in readings.(See flowsheet for specifics). Decision made for safety to not give PRN hydralazine until pt's BP could be rechecked  once more to prevent pt's BP from possibly dropping to fast. Report given to night shift of BP reading and PRN that could be given. Encouraged to have BP checked after report to allow time for accurate reading.   Pt.known to be labile with Blood sugars and blood pressures per pt. Dr.Shaw informed of BP being high and medications were ordered right away.

## 2023-02-21 DIAGNOSIS — E785 Hyperlipidemia, unspecified: Secondary | ICD-10-CM | POA: Diagnosis not present

## 2023-02-21 DIAGNOSIS — E1110 Type 2 diabetes mellitus with ketoacidosis without coma: Secondary | ICD-10-CM | POA: Diagnosis not present

## 2023-02-21 DIAGNOSIS — I10 Essential (primary) hypertension: Secondary | ICD-10-CM | POA: Diagnosis not present

## 2023-02-21 DIAGNOSIS — E860 Dehydration: Secondary | ICD-10-CM

## 2023-02-21 LAB — BASIC METABOLIC PANEL
Anion gap: 7 (ref 5–15)
BUN: 30 mg/dL — ABNORMAL HIGH (ref 8–23)
CO2: 28 mmol/L (ref 22–32)
Calcium: 8 mg/dL — ABNORMAL LOW (ref 8.9–10.3)
Chloride: 102 mmol/L (ref 98–111)
Creatinine, Ser: 1.48 mg/dL — ABNORMAL HIGH (ref 0.44–1.00)
GFR, Estimated: 34 mL/min — ABNORMAL LOW (ref >60)
Glucose, Bld: 173 mg/dL — ABNORMAL HIGH (ref 70–99)
Potassium: 3.2 mmol/L — ABNORMAL LOW (ref 3.5–5.1)
Sodium: 137 mmol/L (ref 135–145)

## 2023-02-21 LAB — CBC
HCT: 34.1 % — ABNORMAL LOW (ref 36.0–46.0)
Hemoglobin: 10.9 g/dL — ABNORMAL LOW (ref 12.0–15.0)
MCH: 29.9 pg (ref 26.0–34.0)
MCHC: 32 g/dL (ref 30.0–36.0)
MCV: 93.7 fL (ref 80.0–100.0)
Platelets: 178 10*3/uL (ref 150–400)
RBC: 3.64 MIL/uL — ABNORMAL LOW (ref 3.87–5.11)
RDW: 14.7 % (ref 11.5–15.5)
WBC: 6.3 10*3/uL (ref 4.0–10.5)
nRBC: 0 % (ref 0.0–0.2)

## 2023-02-21 LAB — GLUCOSE, CAPILLARY
Glucose-Capillary: 135 mg/dL — ABNORMAL HIGH (ref 70–99)
Glucose-Capillary: 164 mg/dL — ABNORMAL HIGH (ref 70–99)
Glucose-Capillary: 167 mg/dL — ABNORMAL HIGH (ref 70–99)
Glucose-Capillary: 239 mg/dL — ABNORMAL HIGH (ref 70–99)
Glucose-Capillary: 244 mg/dL — ABNORMAL HIGH (ref 70–99)

## 2023-02-21 LAB — MAGNESIUM: Magnesium: 2.1 mg/dL (ref 1.7–2.4)

## 2023-02-21 MED ORDER — CARVEDILOL 25 MG PO TABS: 25.0000 mg | ORAL_TABLET | Freq: Two times a day (BID) | ORAL | 2 refills | Status: DC

## 2023-02-21 MED ORDER — POTASSIUM CHLORIDE CRYS ER 20 MEQ PO TBCR: 40.0000 meq | EXTENDED_RELEASE_TABLET | Freq: Two times a day (BID) | ORAL | Status: DC

## 2023-02-21 MED ORDER — POTASSIUM CHLORIDE CRYS ER 20 MEQ PO TBCR
40.0000 meq | EXTENDED_RELEASE_TABLET | Freq: Once | ORAL | Status: AC
  Administered 2023-02-21: 40 meq via ORAL
  Filled 2023-02-21: qty 2

## 2023-02-21 NOTE — Discharge Summary (Signed)
Physician Discharge Summary   Patient: Emily Jennings MRN: [id number] DOB: [birth year]  Admit date:     02/16/2023  Discharge date: 02/21/23  Discharge Physician: Vassie Loll   PCP: [name], MD   Recommendations at discharge:  Reassess blood pressure and adjust antihypertensive treatment as needed Make sure patient follow-up with urology/nephrology service given presence of nephrolithiasis and mild left hydronephrosis.  Patient with prior history of chronic kidney disease currently transition state from 3B to 4 and will benefit of medication adjustments by nephrology service. Continue close monitoring of patient's CBGs with further adjustment to hypoglycemic regimen as required.  Discharge Diagnoses: Principal Problem:   DKA (diabetic ketoacidosis) (HCC) Active Problems:   Essential hypertension   Hyperlipidemia LDL goal <100   Hypokalemia   High anion gap metabolic acidosis   Pseudohyponatremia   Hypoalbuminemia due to protein-calorie malnutrition (HCC)   Acute kidney injury superimposed on chronic kidney disease (HCC)   Dehydration   Brief hospital admission narrative: As per H&P written by Dr. Thomes Dinning on 02/17/2023 Emily Jennings is a 89 y.o. female with medical history significant of diabetes mellitus, hypertension, hyperlipidemia who presents to the emergency department after being brought by the husband for evaluation of elevated blood glucose level.  Patient states that there was a recent change in her insulin which resulted in having nausea, vomiting, several episodes of loose bowel movements after taking the new insulin.  She went back to her endocrinologist and adjustment was made to a new insulin prescribed.  On getting home, she checked her blood glucose level which read " high ", she called the pharmacy to fill the new prescription, however, she was advised by the pharmacist to go to the ED for further evaluation and management.    ED Course:  In the emergency  department, BP was elevated at 170/66, but other vital signs are within normal range.  Workup in the ED showed normal CBC, BMP showed sodium 129, potassium 3.0, chloride 87, bicarb 16, blood glucose 544, BUN 47, creatinine 2.43 (baseline creatinine at 1.4-1.7), albumin 3.3, AST 49, ALT 29, total bilirubin 1.9, EGFR 19, anion gap 31, beta hydroxybutyric acid > 8.0 Patient was started on insulin drip per Endo tool.  Potassium was replenished.  Hospitalist was asked to admit patient for further evaluation and management.  Assessment and Plan: High anion gap metabolic acidosis due to DKA -Resolved after receiving fluid resuscitation and insulin drip. -Patient advised to follow modified carbohydrate diet, maintain adequate hydration and close follow-up with PCP.  Hypertensive urgency in the setting of essential hypertension-patient in the requiring Cardene drip; then stabilize and discharge home on Lasix, spironolactone, adjusted dose of coreg and resumption of her amlodipine/azor meds. -Heart healthy/low-sodium diet recommended.  Acute kidney injury on chronic kidney disease -Patient baseline creatinine 1.4-1.7 -She is in transition stage of chronic kidney disease a stage IIIb due to chronic kidney disease stage IV. -Advised to maintain adequate hydration and continue low-sodium diet and antihypertensive agents. -Patient will require close follow-up with PCP and nephrology service.  Renal ultrasound with nephrolithiasis and mild left hydronephrosis -Outpatient follow-up with urology recommended  Hyperlipidemia -Continue statin. -Heart healthy diet recommended.  Consultants: None Procedures performed: See below for x-ray reports Disposition: Home Diet recommendation: Heart healthy/modified carbohydrate diet.  DISCHARGE MEDICATION: Allergies as of 02/21/2023       Reactions   Benadryl [diphenhydramine Hcl] Hypertension   Citalopram Other (See Comments)   unknown   Metformin And Related  Diarrhea  Lost appetite and weight    Tramadol Other (See Comments)   Felt light headed and dizzy   Crestor [rosuvastatin] Other (See Comments)   "Feet swelling", makes her feel weak        Medication List     TAKE these medications    alendronate 70 MG tablet Commonly known as: FOSAMAX TAKE 1 TABLET BY MOUTH EVERY 7 DAYS. TAKE WITH A FULL GLASS OF WATER ON AN EMPTY STOMACH   amLODipine-olmesartan 5-40 MG tablet Commonly known as: Azor Take 1 tablet by mouth daily.   aspirin EC 81 MG tablet Take 1 tablet (81 mg total) by mouth daily with breakfast.   blood glucose meter kit and supplies Kit One touch Ultra. Use three times daily as directed. (FOR ICD E11.65)   calcitRIOL 0.25 MCG capsule Commonly known as: ROCALTROL Take 0.25 mcg by mouth 3 (three) times a week.   carvedilol 25 MG tablet Commonly known as: COREG Take 1 tablet (25 mg total) by mouth 2 (two) times daily with a meal. What changed:  medication strength how much to take   Dexcom G7 Sensor Misc 1 Device by Does not apply route as directed.   Farxiga 10 MG Tabs tablet Generic drug: dapagliflozin propanediol Take 10 mg by mouth every morning.   furosemide 40 MG tablet Commonly known as: LASIX Take 1 tablet (40 mg total) by mouth 2 (two) times daily.   Insulin Pen Needle 32G X 4 MM Misc 1 each by Does not apply route in the morning and at bedtime.   OneTouch Delica Lancets 33G Misc 1 each by Does not apply route 3 (three) times daily. Use to check glucose three times daily   OneTouch Ultra test strip Generic drug: glucose blood USE TO TEST 4 TIMES A DAY   Potassium Chloride ER 20 MEQ Tbcr Take 1 tablet (20 mEq total) by mouth daily. 1 tab daily by mouth--- take while taking Lasix/furosemide   rosuvastatin 5 MG tablet Commonly known as: CRESTOR TAKE 1 TABLET (5 MG TOTAL) BY MOUTH DAILY.   spironolactone 50 MG tablet Commonly known as: ALDACTONE Take 50 mg by mouth daily.   Toujeo  SoloStar 300 UNIT/ML Solostar Pen Generic drug: insulin glargine (1 Unit Dial) Inject 12 Units into the skin daily. What changed: additional instructions        Follow-up Information     [name], MD. Schedule an appointment as soon as possible for a visit in 1 week(s).   Specialty: Family Medicine Contact information: [address] [place] [address] [phone]                Discharge Exam: Ceasar Mons Weights   02/17/23 0449 02/20/23 0450 02/21/23 0506  Weight: 53.8 kg 65.7 kg 55 kg   General exam: Alert, awake, oriented x 3; no acute distress.  Reporting good urine output and feeling much better. Respiratory system: Clear to auscultation. Respiratory effort normal.  Good saturation on room air. Cardiovascular system:RRR. No rubs or gallops. Gastrointestinal system: Abdomen is nondistended, soft and nontender. No organomegaly or masses felt. Normal bowel sounds heard. Central nervous system: Alert and oriented. No focal neurological deficits. Extremities: No cyanosis, clubbing or edema. Skin: No petechiae. Psychiatry: Judgement and insight appear normal. Mood & affect appropriate.   Condition at discharge: Stable and improved.  The results of significant diagnostics from this hospitalization (including imaging, microbiology, ancillary and laboratory) are listed below for reference.   Imaging Studies: DG HIP UNILAT  WITH PELVIS 2-3 VIEWS RIGHT  Result Date: 02/20/2023 CLINICAL DATA:  Recent fall.  Right hip/thigh knot. EXAM: DG HIP (WITH OR WITHOUT PELVIS) 2-3V RIGHT COMPARISON:  CT abdomen pelvis 01/02/2022. Abdomen x-ray 12/27/2005. FINDINGS: Degenerative change and scoliosis lower lumbar spine. Degenerative changes both SI joints and hips. No acute or focal bony abnormality identified. No evidence of acute fracture or dislocation. Punctate calcification noted over the left kidney consistent with known left renal stone. Aortoiliac atherosclerotic  vascular calcification. Pelvic calcifications consistent with phleboliths. Calcified fibroid again noted. Prominent soft tissue swelling adjacent to the right hip cannot be excluded. This could represent a hematoma given the patient's history. IMPRESSION: 1. Degenerative changes scoliosis lumbar spine. Degenerative changes both SI joints and hips. No acute bony abnormality identified. 2.  Aortoiliac atherosclerotic vascular disease. 3.  Calcified fibroid again noted. 4. Prominent soft tissue swelling adjacent to the right hip cannot be excluded. This could represent a hematoma given the patient's history. Electronically Signed   By: Maisie Fus  Register M.D.   On: 02/20/2023 08:38   US RENAL  Result Date: 02/18/2023 CLINICAL DATA:  Acute renal insufficiency EXAM: RENAL / URINARY TRACT ULTRASOUND COMPLETE COMPARISON:  October 11, 2022 FINDINGS: Right Kidney: Renal measurements: 9.8 x 3.6 x 5.3 cm = volume: 97 mL. There appear to be several nonobstructive stones in the right kidney with the largest measuring 4 mm. Increased cortical echogenicity. There appear to be multiple cysts in the right kidney with the largest measuring 3.8 cm. No follow-up imaging recommended for the cysts. Left Kidney: Renal measurements: 9.8 x 3.7 x 4.4 cm = volume: 83 mL. Increased cortical echogenicity. There appear to be stones in the left kidney with the largest measuring 10 mm. Mild hydronephrosis. There is a dominant cyst in the left kidney measuring 3.2 cm. No follow-up imaging recommended for the cyst. Bladder: Appears normal for degree of bladder distention. Other: None. IMPRESSION: 1. The study is limited due to shadowing bowel gas. 2. Nonobstructive stones in the right kidney. 3. Stones in the left kidney with the largest measuring 10 mm. Mild left hydronephrosis. Electronically Signed   By: Gerome Sam III M.D.   On: 02/18/2023 11:58    Microbiology: Results for orders placed or performed during the hospital encounter of  02/16/23  MRSA Next Gen by PCR, Nasal     Status: None   Collection Time: 02/17/23  2:16 AM   Specimen: Nasal Mucosa; Nasal Swab  Result Value Ref Range Status   MRSA by PCR Next Gen NOT DETECTED NOT DETECTED Final    Comment: (NOTE) The GeneXpert MRSA Assay (FDA approved for NASAL specimens only), is one component of a comprehensive MRSA colonization surveillance program. It is not intended to diagnose MRSA infection nor to guide or monitor treatment for MRSA infections. Test performance is not FDA approved in patients less than 43 years old. Performed at [facility], [address], [place], [place] [address]     Labs: CBC: Recent Labs  Lab 02/17/23 0028 02/17/23 0501 02/18/23 0429 02/19/23 0500 02/20/23 0436 02/21/23 0425  WBC 9.5 10.0 14.0* 8.6 7.4 6.3  NEUTROABS 8.1*  --   --   --   --   --   HGB 12.2 9.9* 10.8* 10.7* 11.6* 10.9*  HCT 37.2 30.9* 32.9* 32.8* 35.8* 34.1*  MCV 92.5 91.7 90.9 91.6 94.0 93.7  PLT 242 196 198 177 178 178   Basic Metabolic Panel: Recent Labs  Lab 02/17/23 0501 02/17/23 1019 02/18/23 0429 02/18/23  1431 02/19/23 0500 02/20/23 0436 02/21/23 0425  NA 131*   < > 132* 132* 138 137 137  K 2.3*   < > 3.4* 3.5 3.0* 3.6 3.2*  CL 91*   < > 94* 97* 102 103 102  CO2 23   < > 28 28 29 26 28   GLUCOSE 365*   < > 259* 187* 149* 227* 173*  BUN 48*   < > 53* 48* 38* 32* 30*  CREATININE 2.24*   < > 2.34* 2.20* 1.64* 1.40* 1.48*  CALCIUM 7.9*   < > 8.1* 7.9* 7.8* 8.0* 8.0*  MG 2.2  --  2.3  --  2.1 2.1 2.1  PHOS 3.4  --   --   --   --   --   --    < > = values in this interval not displayed.   Liver Function Tests: Recent Labs  Lab 02/17/23 0028  AST 49*  ALT 29  ALKPHOS 90  BILITOT 1.9*  PROT 6.2*  ALBUMIN 3.3*   CBG: Recent Labs  Lab 02/20/23 2047 02/21/23 0004 02/21/23 0424 02/21/23 0803 02/21/23 1138  GLUCAP 146* 135* 164* 244* 239*    Discharge time spent: greater than 30 minutes.  Signed: Vassie Loll, MD [facility] 02/21/2023

## 2023-02-21 NOTE — Consult Note (Signed)
   [facility] Inpatient Consult   02/21/2023  MYDA CANCHOLA Jan 17, 1934 [id number]  Primary Care Provider:  Dr. Syliva Overman [facility] Primary [facility])  Patient is currently active with [facility] [[facility]] Care Management for chronic disease management services.  Patient has been engaged by a [facility] RN.  Our community based plan of care has focused on disease management and community resource support.   Patient will receive a post hospital call and will be evaluated for assessments and disease process education.   Plan: Pt discharged home.  Inpatient Transition Of Care [TOC] team member to make aware that [facility] Care Management following.  Of note, [facility] Care Management services does not replace or interfere with any services that are needed or arranged by inpatient [facility] care management team.   For additional questions or referrals please contact:  Elliot Cousin, RN, BSN [facility] Liaison [place]   [facility]  Population Health Office Hours MTWF  8:00 am-6:00 pm Off on Thursday [phone] mobile [phone] [Office toll free line][facility] Office Hours are M-F 8:30 - 5 pm 24 hour nurse advise line [phone] Concierge  [email or web address]

## 2023-02-22 ENCOUNTER — Ambulatory Visit: Payer: Self-pay

## 2023-02-22 ENCOUNTER — Telehealth: Payer: Self-pay

## 2023-02-22 NOTE — Chronic Care Management (AMB) (Signed)
   02/22/2023  [name] 1934-04-03 [id number]   Reason for Encounter: Patient is not currently enrolled in the CCM program. CCM status changed to previously enrolled.  Alto Denver RN, MSN, CCM RN Care Manager  Chronic Care Management Direct Number: [phone]

## 2023-02-22 NOTE — Transitions of Care (Post Inpatient/ED Visit) (Signed)
   02/22/2023  Name: Emily Jennings MRN: [id number] DOB: [birth year]  Today's TOC FU Call Status: Today's TOC FU Call Status:: Unsuccessul Call (1st Attempt) Unsuccessful Call (1st Attempt) Date: 02/22/23  Attempted to reach the patient regarding the most recent Inpatient/ED visit.  Follow Up Plan: Additional outreach attempts will be made to reach the patient to complete the Transitions of Care (Post Inpatient/ED visit) call.     Antionette Fairy, RN,BSN,CCM [facility] Health/[facility] Care Management Care Management Community Coordinator Direct Phone: [phone] Toll Free: [phone] Fax: [phone]

## 2023-02-23 ENCOUNTER — Telehealth: Payer: Self-pay

## 2023-02-23 MED ORDER — TOUJEO SOLOSTAR 300 UNIT/ML ~~LOC~~ SOPN: 15.0000 [IU] | PEN_INJECTOR | Freq: Every day | SUBCUTANEOUS | 3 refills | Status: DC

## 2023-02-23 MED ORDER — INSULIN LISPRO (1 UNIT DIAL) 100 UNIT/ML (KWIKPEN): PEN_INJECTOR | SUBCUTANEOUS | 11 refills | Status: DC

## 2023-02-23 NOTE — Transitions of Care (Post Inpatient/ED Visit) (Signed)
   02/23/2023  Name: Emily Jennings MRN: [id number] DOB: [birth year]  Today's TOC FU Call Status: Today's TOC FU Call Status:: Unsuccessful Call (2nd Attempt) Unsuccessful Call (2nd Attempt) Date: 02/23/23  Attempted to reach the patient regarding the most recent Inpatient/ED visit.  Follow Up Plan: Additional outreach attempts will be made to reach the patient to complete the Transitions of Care (Post Inpatient/ED visit) call.     Antionette Fairy, RN,BSN,CCM [facility] Health/[facility] Care Management Care Management Community Coordinator Direct Phone: [phone] Toll Free: [phone] Fax: [phone]

## 2023-02-23 NOTE — Telephone Encounter (Signed)
Pt called to advise her blood sugars are in the 300's. She checked with a finger stick and her sugar was 416. Pt is currently taking 12 units of Tougeo right now. She is asking what changes does she need to make to get her sugars down,

## 2023-02-23 NOTE — Transitions of Care (Post Inpatient/ED Visit) (Signed)
   02/23/2023  Name: Emily Jennings MRN: [id number] DOB: Sep 28, 1933  Today's TOC FU Call Status: Today's TOC FU Call Status:: Unsuccessful Call (3rd Attempt) Unsuccessful Call (3rd Attempt) Date: 02/23/23  Attempted to reach the patient regarding the most recent Inpatient/ED visit.  Follow Up Plan: No further outreach attempts will be made at this time. We have been unable to contact the patient.    Antionette Fairy, RN,BSN,CCM [facility] Health/[facility] Care Management Care Management Community Coordinator Direct Phone: [phone] Toll Free: [phone] Fax: [phone]

## 2023-02-24 NOTE — Telephone Encounter (Signed)
Patient has been advised on medication adjustment and will follow them. Patient doesn't want to take the Toujeo as she feels it doesn't agree with her body at all. She would like to do just the sliding scale and wait to hear back from provider Monday regarding changing the Toujeo .

## 2023-02-27 ENCOUNTER — Ambulatory Visit (INDEPENDENT_AMBULATORY_CARE_PROVIDER_SITE_OTHER): Payer: Medicare Other | Admitting: Family Medicine

## 2023-02-27 ENCOUNTER — Encounter: Payer: Self-pay | Admitting: Family Medicine

## 2023-02-27 VITALS — BP 144/70 | HR 71 | Ht 60.0 in | Wt 119.0 lb

## 2023-02-27 DIAGNOSIS — I10 Essential (primary) hypertension: Secondary | ICD-10-CM | POA: Diagnosis not present

## 2023-02-27 DIAGNOSIS — N184 Chronic kidney disease, stage 4 (severe): Secondary | ICD-10-CM | POA: Diagnosis not present

## 2023-02-27 DIAGNOSIS — E1122 Type 2 diabetes mellitus with diabetic chronic kidney disease: Secondary | ICD-10-CM | POA: Diagnosis not present

## 2023-02-27 DIAGNOSIS — I130 Hypertensive heart and chronic kidney disease with heart failure and stage 1 through stage 4 chronic kidney disease, or unspecified chronic kidney disease: Secondary | ICD-10-CM | POA: Diagnosis not present

## 2023-02-27 DIAGNOSIS — N200 Calculus of kidney: Secondary | ICD-10-CM

## 2023-02-27 DIAGNOSIS — N1330 Unspecified hydronephrosis: Secondary | ICD-10-CM

## 2023-02-27 DIAGNOSIS — N1831 Chronic kidney disease, stage 3a: Secondary | ICD-10-CM | POA: Diagnosis not present

## 2023-02-27 DIAGNOSIS — M25451 Effusion, right hip: Secondary | ICD-10-CM | POA: Diagnosis not present

## 2023-02-27 DIAGNOSIS — J3089 Other allergic rhinitis: Secondary | ICD-10-CM

## 2023-02-27 DIAGNOSIS — Z794 Long term (current) use of insulin: Secondary | ICD-10-CM | POA: Diagnosis not present

## 2023-02-27 DIAGNOSIS — N183 Chronic kidney disease, stage 3 unspecified: Secondary | ICD-10-CM

## 2023-02-27 DIAGNOSIS — E1165 Type 2 diabetes mellitus with hyperglycemia: Secondary | ICD-10-CM

## 2023-02-27 DIAGNOSIS — Z09 Encounter for follow-up examination after completed treatment for conditions other than malignant neoplasm: Secondary | ICD-10-CM | POA: Diagnosis not present

## 2023-02-27 NOTE — Assessment & Plan Note (Addendum)
Fall x 4 weeks ago, has swelling on hip,size is 1.5 golf ball, no skin breakdown or redness, rept YS

## 2023-02-27 NOTE — Patient Instructions (Addendum)
F/U in 8 weeks cal, if you need me sooner  You are referred urgently for nursing to do evaluation at your home and  Get medications clarified  Labs today CBC, CHEM 7 AND egfr   NURSE PLEASE ORDER ULTRASOUND OF RIGHT HIP MASS/ SWELLING X 4 WEEKS, S/ P FALL  DISCUSS DIABETES MEDICATION WITH YOUR DIABETES DOC, VERY IMPORTANT YOU EAT 3 TIMES DAILY  AND YOU MAY ALSO NEED A BEDTIME SNACK

## 2023-02-28 ENCOUNTER — Telehealth: Payer: Self-pay | Admitting: *Deleted

## 2023-02-28 LAB — CBC
Hematocrit: 35.8 % (ref 34.0–46.6)
Hemoglobin: 11.3 g/dL (ref 11.1–15.9)
MCH: 29 pg (ref 26.6–33.0)
MCHC: 31.6 g/dL (ref 31.5–35.7)
MCV: 92 fL (ref 79–97)
Platelets: 301 10*3/uL (ref 150–450)
RBC: 3.9 x10E6/uL (ref 3.77–5.28)
RDW: 13.2 % (ref 11.7–15.4)
WBC: 5.9 10*3/uL (ref 3.4–10.8)

## 2023-02-28 LAB — BMP8+EGFR
BUN/Creatinine Ratio: 16 (ref 12–28)
BUN: 29 mg/dL — ABNORMAL HIGH (ref 8–27)
CO2: 30 mmol/L — ABNORMAL HIGH (ref 20–29)
Calcium: 8.6 mg/dL — ABNORMAL LOW (ref 8.7–10.3)
Chloride: 95 mmol/L — ABNORMAL LOW (ref 96–106)
Creatinine, Ser: 1.86 mg/dL — ABNORMAL HIGH (ref 0.57–1.00)
Glucose: 189 mg/dL — ABNORMAL HIGH (ref 70–99)
Potassium: 3.8 mmol/L (ref 3.5–5.2)
Sodium: 136 mmol/L (ref 134–144)
eGFR: 26 mL/min/{1.73_m2} — ABNORMAL LOW (ref >59)

## 2023-02-28 NOTE — Telephone Encounter (Signed)
Patient has stopped the Toujeo because she states that her sugars spike when she takes this medication.  Patient doesn't feel like short or long acting insulin are keeping her sugars controlled.

## 2023-02-28 NOTE — Progress Notes (Signed)
  Care Coordination  Outreach Note  02/28/2023 Name: JENALEE TREVIZO MRN: [id number] DOB: 02-Jun-1934   Care Coordination Outreach Attempts: An unsuccessful telephone outreach was attempted today to offer the patient information about available care coordination services.  Follow Up Plan:  Additional outreach attempts will be made to offer the patient care coordination information and services.   Encounter Outcome:  No Answer  Christie Nottingham  Care Coordination Care Guide  Direct Dial: [phone]

## 2023-02-28 NOTE — Telephone Encounter (Signed)
FYI: Patient has been advised and states she is not comfortable with taking 15 units of the long acting.

## 2023-03-03 DIAGNOSIS — E1142 Type 2 diabetes mellitus with diabetic polyneuropathy: Secondary | ICD-10-CM | POA: Diagnosis not present

## 2023-03-05 ENCOUNTER — Ambulatory Visit: Payer: Medicare Other | Admitting: Internal Medicine

## 2023-03-05 NOTE — Progress Notes (Signed)
  Care Coordination  Outreach Note  03/05/2023 Name: Emily Jennings MRN: [id number] DOB: [birth year]   Care Coordination Outreach Attempts: A second unsuccessful outreach was attempted today to offer the patient with information about available care coordination services.  Follow Up Plan:  Additional outreach attempts will be made to offer the patient care coordination information and services.   Encounter Outcome:  No Answer  Christie Nottingham  Care Coordination Care Guide  Direct Dial: [phone]

## 2023-03-05 NOTE — Progress Notes (Deleted)
Name: Emily Jennings  MRN/ DOB: [id number], [birth year]   Age/ Sex: 89 y.o., female    PCP: [name], MD   Reason for Endocrinology Evaluation: Type 2 Diabetes Mellitus     Date of Initial Endocrinology Visit: 12/25/2022    PATIENT IDENTIFIER: Emily Jennings is a 89 y.o. female with a past medical history of DM, anxiety, Hx breast CA. The patient presented for initial endocrinology clinic visit on 12/25/2022 for consultative assistance with her diabetes management.    HPI: Emily Jennings was    Diagnosed with DM at age 67 Prior Medications tried/Intolerance: Metformin-weight loss Currently checking blood sugars 3 x / day,  before breakfast Hypoglycemia episodes : yes              Hemoglobin A1c has ranged from 6.8% in 2022, peaking at 9.2% in 2016.   Transferred care from Dr. Isidoro Donning office   Pt with fear of hypoglycemia  She was started on Farxiga by her nephrologist 2024  SUBJECTIVE:   During the last visit (02/16/2023): A1c 6.9%     Today (03/05/23): Emily Jennings is here for follow-up on diabetes management. She  checks her blood sugars multiple times daily. The patient has  had hypoglycemic episodes since the last clinic visit, pt is symptomatic with these episodes.   Patient was hospitalized for DKA 02/21/2023  She is on farxiga through nephrology  She has occasional nausea but no vomiting  Has occaional diarrhea with constipaiton    HOME DIABETES REGIMEN: Toujeo 15 units daily Humalog (BG -130/45)    Statin: yes ACE-I/ARB: no    CONTINUOUS GLUCOSE MONITORING RECORD INTERPRETATION    Dates of Recording: 5/17-5/30/2024  Sensor description:dexcom  Results statistics:   CGM use % of time 100  Average and SD 206/79  Time in range      39  %  % Time Above 180 34  % Time above 250 26  % Time Below target <1   Glycemic patterns summary: Bg's trend down at night and increase during the day   Hyperglycemic episodes  postprandial    Hypoglycemic episodes occurred after a bolus   Overnight periods: variable     DIABETIC COMPLICATIONS: Microvascular complications:  CKD IV Denies:  Last eye exam: Completed 2023  Macrovascular complications:   Denies: CAD, PVD, CVA   PAST HISTORY: Past Medical History:  Past Medical History:  Diagnosis Date   Allergy    Anxiety    ANXIETY DISORDER, GENERALIZED 07/15/2007   Qualifier: Diagnosis of  By: Chipper Herb     Arthritis    Cancer [facility]) 2009   breast, carcinoma in situ left   Carcinoma in situ of breast 05/21/2008   Qualifier: Diagnosis of  By: [name] MD, Margaret  Diagnosed in 2009, completed 5 year course of tamoxifen, no evidence of recurrence    Carotid stenosis    11/16/2005  mild plaque formation and stenosis proximal right ECA   Cataract    Complication of anesthesia    Coronary artery disease    cardiac catheterization on 03/20/2006  LAD mid 40% stenosis, left circumflex mild 40% stenosis, RCA mid-vessel 40% to 50% lesion   EF 60%   Diabetes mellitus    GERD (gastroesophageal reflux disease)    Hernia, inguinal    left   Hyperglycemia    Hypertension    Insomnia 11/16/2011   Low blood potassium    Non-insulin dependent type 2 diabetes mellitus (HCC)    Osteoporosis  Shortness of breath    2D Echocardiogram 01/26/2009   EF of greater than 55%, mild MR, mild TR, normal ventricular function   Thickened endometrium 10/26/2017   Noted by gyne in 2017, missed 6 month follow up, referred in 09/2017   Ventricular tachycardia, non-sustained (HCC)    developed during stress test 02/08/2006, spontaneously aborted, mild reversible apical defect   Past Surgical History:  Past Surgical History:  Procedure Laterality Date   BREAST LUMPECTOMY Left 2009   Left breast 2009   CATARACT EXTRACTION W/PHACO Left 10/28/2014   Procedure: PHACO EMULSION CATARACT EXTRACTION WITH INTRAOCULAR LENS IMPLANT LEFT EYE (IOC);  Surgeon: Chalmers Guest, MD;  Location: [facility] OR;   Service: Ophthalmology;  Laterality: Left;   COLONOSCOPY     cyst removed from left foot     REFRACTIVE SURGERY Left     Social History:  reports that she has never smoked. She has never been exposed to tobacco smoke. She has never used smokeless tobacco. She reports that she does not drink alcohol and does not use drugs. Family History:  Family History  Problem Relation Age of Onset   Hypertension Mother    Hyperlipidemia Mother    Stroke Mother    Urticaria Mother    Cancer Father        pancreatic   Colon cancer Father    Heart disease Brother 40       bypass   Heart disease Brother 3       bypass   Arthritis Other    Asthma Other    Diabetes Other    Colon cancer Paternal Aunt    Esophageal cancer Neg Hx    Stomach cancer Neg Hx    Rectal cancer Neg Hx      HOME MEDICATIONS: Allergies as of 03/05/2023       Reactions   Benadryl [diphenhydramine Hcl] Hypertension   Citalopram Other (See Comments)   unknown   Metformin And Related Diarrhea   Lost appetite and weight    Tramadol Other (See Comments)   Felt light headed and dizzy   Crestor [rosuvastatin] Other (See Comments)   "Feet swelling", makes her feel weak        Medication List        Accurate as of March 05, 2023  8:17 AM. If you have any questions, ask your nurse or doctor.          alendronate 70 MG tablet Commonly known as: FOSAMAX TAKE 1 TABLET BY MOUTH EVERY 7 DAYS. TAKE WITH A FULL GLASS OF WATER ON AN EMPTY STOMACH   amLODipine-olmesartan 5-40 MG tablet Commonly known as: Azor Take 1 tablet by mouth daily.   aspirin EC 81 MG tablet Take 1 tablet (81 mg total) by mouth daily with breakfast.   blood glucose meter kit and supplies Kit One touch Ultra. Use three times daily as directed. (FOR ICD E11.65)   calcitRIOL 0.25 MCG capsule Commonly known as: ROCALTROL Take 0.25 mcg by mouth 3 (three) times a week.   carvedilol 25 MG tablet Commonly known as: COREG Take 1 tablet (25 mg  total) by mouth 2 (two) times daily with a meal.   Dexcom G7 Sensor Misc 1 Device by Does not apply route as directed.   Farxiga 10 MG Tabs tablet Generic drug: dapagliflozin propanediol Take 10 mg by mouth every morning.   furosemide 40 MG tablet Commonly known as: LASIX Take 1 tablet (40 mg total) by mouth 2 (two) times  daily.   insulin lispro 100 UNIT/ML KwikPen Commonly known as: HumaLOG KwikPen Max daily 30 units   Insulin Pen Needle 32G X 4 MM Misc 1 each by Does not apply route in the morning and at bedtime.   OneTouch Delica Lancets 33G Misc 1 each by Does not apply route 3 (three) times daily. Use to check glucose three times daily   OneTouch Ultra test strip Generic drug: glucose blood USE TO TEST 4 TIMES A DAY   Potassium Chloride ER 20 MEQ Tbcr Take 1 tablet (20 mEq total) by mouth daily. 1 tab daily by mouth--- take while taking Lasix/furosemide   rosuvastatin 5 MG tablet Commonly known as: CRESTOR TAKE 1 TABLET (5 MG TOTAL) BY MOUTH DAILY.   spironolactone 50 MG tablet Commonly known as: ALDACTONE Take 50 mg by mouth daily.   Toujeo SoloStar 300 UNIT/ML Solostar Pen Generic drug: insulin glargine (1 Unit Dial) Inject 15 Units into the skin daily.         ALLERGIES: Allergies  Allergen Reactions   Benadryl [Diphenhydramine Hcl] Hypertension   Citalopram Other (See Comments)    unknown   Metformin And Related Diarrhea    Lost appetite and weight    Tramadol Other (See Comments)    Felt light headed and dizzy   Crestor [Rosuvastatin] Other (See Comments)    "Feet swelling", makes her feel weak     REVIEW OF SYSTEMS: A comprehensive ROS was conducted with the patient and is negative except as per HPI    OBJECTIVE:   VITAL SIGNS: There were no vitals taken for this visit.   PHYSICAL EXAM:  General: Pt appears well and is in NAD  Lungs: Clear with good BS bilat   Heart: RRR   Abdomen:  soft, nontender  Extremities:  Lower extremities  - Trace  pretibial edema.   Neuro: MS is good with appropriate affect, pt is alert and Ox3    DM Foot Exam 02/16/2023  The skin of the feet is intact without sores or ulcerations. The pedal pulses are 2+ on right and 2+ on left. The sensation is decreased  to a screening 5.07, 10 gram monofilament bilaterally   DATA REVIEWED:  Lab Results  Component Value Date   HGBA1C 6.9 (A) 12/25/2022   HGBA1C 7.4 (A) 07/06/2022   HGBA1C 7.2 (H) 03/22/2022    ASSESSMENT / PLAN / RECOMMENDATIONS:   1) Type 2 Diabetes Mellitus, Optimally controlled, With neuropathic and CKD IV complications - Most recent A1c of 6.9 %. Goal A1c < 7.5 %.    - Pt with hypoglycemia but also she has fear of it which makes it difficult to optimize glucose  - Given advance age , BG goal is 90-200 mg/dL  - She was started on Farxiga through nephrology which I suspect  has improved her insulin sensitivity, hence increased hypoglycemia  - Given the lack of flexibility with insulin mix, I will switch her to basal insulin and see if she needs add on therapy in the future or the basal /farxiga may be sufficient   MEDICATIONS:  Start Toujeo 12 units QAM  Continue Farxiga 10 mg per nephrology  EDUCATION / INSTRUCTIONS: BG monitoring instructions: Patient is instructed to check her blood sugars 3 times a day. Call [facility] if: BG persistently < 70  I reviewed the Rule of 15 for the treatment of hypoglycemia in detail with the patient. Literature supplied.   2) Diabetic complications:  Eye: Does not have known  diabetic retinopathy.  Neuro/ Feet: Does  have known diabetic peripheral neuropathy. Renal: Patient does  have known baseline CKD. She is not on an ACEI/ARB at present.    Follow-up in 2 weeks   Signed electronically by: Lyndle Herrlich, MD  [facility]  [facility] [address] [place], [place] [address] Phone: [phone] FAX:  [phone]   CC: [name], MD [address] [place] [address] Phone: [phone]  Fax: [phone]    Return to Endocrinology clinic as below: Future Appointments  Date Time Provider Department Center  03/05/2023 12:10 PM Ephram Kornegay, Konrad Dolores, MD LBPC-LBENDO None  03/15/2023  9:30 AM AP-US 5 AP-US  H  03/27/2023  3:00 PM Mare Loan, RD NDM-NDMR None  04/24/2023  1:00 PM [name], MD RPC-RPC RPC  05/01/2023  2:00 PM Lenzie Sandler, Konrad Dolores, MD [facility] None  12/03/2023  9:30 AM RPC-ANNUAL WELLNESS VISIT RPC-RPC RPC

## 2023-03-05 NOTE — Progress Notes (Signed)
  Care Coordination   Note   03/05/2023 Name: ATENA SAYRE MRN: [id number] DOB: Jan 13, 1934  Len Blalock Nusser is a 89 y.o. year old female who sees [name], MD for primary care. I reached out to [name] by phone today to offer care coordination services.  Ms. Vangorden was given information about Care Coordination services today including:   The Care Coordination services include support from the care team which includes your Nurse Coordinator, Clinical Social Worker, or Pharmacist.  The Care Coordination team is here to help remove barriers to the health concerns and goals most important to you. Care Coordination services are voluntary, and the patient may decline or stop services at any time by request to their care team member.   Care Coordination Consent Status: Patient agreed to services and verbal consent obtained.   Follow up plan:  Telephone appointment with care coordination team member scheduled for:  03/08/23  Encounter Outcome:  Pt. Scheduled  [facility] Coordination Care Guide  Direct Dial: [phone]

## 2023-03-06 DIAGNOSIS — Z09 Encounter for follow-up examination after completed treatment for conditions other than malignant neoplasm: Secondary | ICD-10-CM | POA: Insufficient documentation

## 2023-03-06 DIAGNOSIS — N200 Calculus of kidney: Secondary | ICD-10-CM | POA: Insufficient documentation

## 2023-03-06 NOTE — Progress Notes (Signed)
Emily Jennings     MRN: [id number]      DOB: [birth year]  Chief Complaint  Patient presents with   Follow-up    Follow up recently seen in ER for kidney stone    HPI Emily Jennings is here for follow up of recent hospitalization from 5/31 to 02/21/2023 and re-evaluation of chronic medical conditions, medication management and review of any available recent lab and radiology data.  Preventive health is updated, specifically  Cancer screening and Immunization.   Questions or concerns regarding consultations or procedures which the PT has had in the interim are  addressed. C/o persistent right hip swelling since recent fsllx 4 week, was black and bluesome decease in size Blood sugar fluctuates and reports intolerance of toujeo, needs to speak with Endo ROS See HPI  Denies recent fever or chills. Denies sinus pressure, nasal congestion, ear pain or sore throat. Denies chest congestion, productive cough or wheezing. Denies chest pains, palpitations and leg swelling Denies abdominal pain, nausea, vomiting,diarrhea or constipation.   Denies dysuria, frequency, hesitancy or incontinence. Denies headaches, seizures, numbness, or tingling. C/o , anxiety Denies skin break down or rash.   PE  BP (!) 144/70   Pulse 71   Ht 5' (1.524 m)   Wt 119 lb (54 kg)   SpO2 96%   BMI 23.24 kg/m   Patient alert and oriented and in no cardiopulmonary distress.  HEENT: No facial asymmetry, EOMI,     Neck supple .  Chest: Clear to auscultation bilaterally.  CVS: S1, S2 no murmurs, no S3.Regular rate.  ABD: Soft non tender.   Ext: No edema  MS: Adequate ROM spine, shoulders, hips and knees.  Skin: Intact, no ulcerations or rash noted.  Psych: Good eye contact, normal affect. Memory intact not anxious or depressed appearing.  CNS: CN 2-12 intact, power,  normal throughout.no focal deficits noted.   Assessment & Plan  Hip swelling, right Fall x 4 weeks ago, has swelling on hip,size is 1.5 golf  ball, no skin breakdown or redness, rept [facility] discharge follow-up Patient in for follow up of recent hospitalization. Discharge summary, and laboratory and radiology data are reviewed, and any questions or concerns  are discussed. Specific issues requiring follow up are specifically addressed.   Essential hypertension Controlled, no change in medication   CKD stage 4 due to type 2 diabetes mellitus (HCC) Refer to nephrology for f/u, worsebned kidney function  Allergic rhinitis stable  Bilateral kidney stones Refer urology, mild hydronephrosis and worsening renal function, now  stage 4  Type 2 diabetes mellitus with stage 3a chronic kidney disease, with long-term current use of insulin [facility]) Emily Jennings is reminded of the importance of commitment to daily physical activity for 30 minutes or more, as able and the need to limit carbohydrate intake to 30 to 60 grams per meal to help with blood sugar control.   The need to take medication as prescribed, test blood sugar as directed, and to call between visits if there is a concern that blood sugar is uncontrolled is also discussed.   Emily Jennings is reminded of the importance of daily foot exam, annual eye examination, and good blood sugar, blood pressure and cholesterol control.     Latest Ref Rng & Units 02/27/2023    2:00 PM 02/21/2023    4:25 AM 02/20/2023    4:36 AM 02/19/2023    5:00 AM 02/18/2023    2:31 PM  Diabetic Labs  Creatinine 0.57 -  1.00 mg/dL 1.61  0.96  0.45  4.09  2.20       02/27/2023    1:36 PM 02/27/2023    1:26 PM 02/27/2023    1:15 PM 02/27/2023    1:13 PM 02/21/2023    3:00 PM 02/21/2023    2:00 PM 02/21/2023    1:00 PM  BP/Weight  Systolic BP 144 148 156 150 149 150 149  Diastolic BP 70 66 66 64 121 64 70  Wt. (Lbs)    119     BMI    23.24 kg/m2         Latest Ref Rng & Units 11/10/2020    1:18 PM 11/10/2020   12:00 AM  Foot/eye exam completion dates  Eye Exam No Retinopathy No Retinopathy     No Retinopathy          This result is from an external source.      Reports intolerance of med, f/u with [facility] HBA1C in 12/2022 is 6.9which is excellent

## 2023-03-06 NOTE — Assessment & Plan Note (Signed)
Controlled, no change in medication  

## 2023-03-06 NOTE — Assessment & Plan Note (Signed)
[name] is reminded of the importance of commitment to daily physical activity for 30 minutes or more, as able and the need to limit carbohydrate intake to 30 to 60 grams per meal to help with blood sugar control.   The need to take medication as prescribed, test blood sugar as directed, and to call between visits if there is a concern that blood sugar is uncontrolled is also discussed.   [name] is reminded of the importance of daily foot exam, annual eye examination, and good blood sugar, blood pressure and cholesterol control.     Latest Ref Rng & Units 02/27/2023    2:00 PM 02/21/2023    4:25 AM 02/20/2023    4:36 AM 02/19/2023    5:00 AM 02/18/2023    2:31 PM  Diabetic Labs  Creatinine 0.57 - 1.00 mg/dL 1.61  0.96  0.45  4.09  2.20       02/27/2023    1:36 PM 02/27/2023    1:26 PM 02/27/2023    1:15 PM 02/27/2023    1:13 PM 02/21/2023    3:00 PM 02/21/2023    2:00 PM 02/21/2023    1:00 PM  BP/Weight  Systolic BP 144 148 156 150 149 150 149  Diastolic BP 70 66 66 64 121 64 70  Wt. (Lbs)    119     BMI    23.24 kg/m2         Latest Ref Rng & Units 11/10/2020    1:18 PM 11/10/2020   12:00 AM  Foot/eye exam completion dates  Eye Exam No Retinopathy No Retinopathy     No Retinopathy         This result is from an external source.      Reports intolerance of med, f/u with [facility] HBA1C in 12/2022 is 6.9which is excellent

## 2023-03-06 NOTE — Assessment & Plan Note (Addendum)
stable °

## 2023-03-06 NOTE — Assessment & Plan Note (Signed)
Refer urology, mild hydronephrosis and worsening renal function, now  stage 4

## 2023-03-06 NOTE — Assessment & Plan Note (Signed)
Patient in for follow up of recent hospitalization. Discharge summary, and laboratory and radiology data are reviewed, and any questions or concerns  are discussed. Specific issues requiring follow up are specifically addressed.  

## 2023-03-06 NOTE — Assessment & Plan Note (Signed)
Refer to nephrology for f/u, worsebned kidney function

## 2023-03-08 ENCOUNTER — Telehealth: Payer: Self-pay

## 2023-03-08 ENCOUNTER — Ambulatory Visit: Payer: Self-pay | Admitting: *Deleted

## 2023-03-08 NOTE — Telephone Encounter (Signed)
Left another message to callback. If you can try to contact patient again before you leave.

## 2023-03-08 NOTE — Telephone Encounter (Signed)
Emily Jennings, She cannot stop Toujeo, but we can reduce the dose if it is too high for her.  Per my records, she is taking 15 units.  We can decrease the dose to 12 units and see how she does.

## 2023-03-08 NOTE — Telephone Encounter (Signed)
Edd Arbour  with the [employer] called stating that she had a visit with patient today and she reports episodes of hypoglycemia. Patient wants to stop the Toujeo because she feels this is causing the hypoglycemia. Patient has tried taking Touejo morning and night with same result.  Per Cala Bradford patient reports that she has a syncope episode where she hit the fridge and injured her arm. I attempted to contact patient 2 times and left a vm for her to callback.    CBG   69  first waking up   251 10:00am

## 2023-03-08 NOTE — Patient Instructions (Addendum)
Visit Information  Thank you for taking time to visit with me today. Please don't hesitate to contact me if I can be of assistance to you.   Following are the goals we discussed today:   Goals Addressed             This Visit's Progress    I will follow the HF Action plan daily and call my provider if I have a wt gain, increased SOB or edema over the next 30 days.   Not on track    Care Coordination Interventions: Interventions Today    Flowsheet Row Most Recent Value  Chronic Disease   Chronic disease during today's visit Diabetes  General Interventions   General Interventions Discussed/Reviewed General Interventions Reviewed, Labs, Sick Day Rules, Doctor Visits, Communication with  Labs Hgb A1c every 6 months  Doctor Visits Discussed/Reviewed Doctor Visits Reviewed, PCP, Specialist  Durable Medical Equipment (DME) Glucomoter  PCP/Specialist Visits Compliance with follow-up visit  Communication with --  [left a message for Dr [facility] assistant to return call to patient or this RN CM about her hypoglycemic episodes with use of Toujeo and patient interest in changing diabetes medicine management referred to 03/08/23 note]  Education Interventions   Education Provided --  [employer]  letter food labels, protein foods, DM/nutrition hypoglycemia]  Provided Verbal Education On Nutrition, Labs, Blood Sugar Monitoring, Medication  Labs Reviewed Hgb A1c, Lipid Profile  Mental Health Interventions   Mental Health Discussed/Reviewed Mental Health Discussed, Coping Strategies  Nutrition Interventions   Nutrition Discussed/Reviewed Nutrition Reviewed, Carbohydrate meal planning, Adding fruits and vegetables, Fluid intake, Portion sizes, Decreasing sugar intake, Increasing proteins, Decreasing fats, Decreasing salt, Supplemental nutrition  Pharmacy Interventions   Pharmacy Dicussed/Reviewed Pharmacy Topics Reviewed, Medications and their functions, Affording Medications  Safety Interventions    Safety Discussed/Reviewed Safety Discussed, Home Safety             Our next appointment is by telephone on 03/29/23 at 1000  Please call the care guide team at [phone] if you need to cancel or reschedule your appointment.   If you are experiencing a Mental Health or Behavioral Health Crisis or need someone to talk to, please call the Suicide and Crisis Lifeline: 988 call the [place] National Suicide Prevention Lifeline: 530-588-2375 or TTY: (825) 025-5742 TTY 843 646 8998) to talk to a trained counselor call 1-800-273-TALK (toll free, 24 hour hotline) call the [facility]: 310-278-4970 call 911   The patient verbalized understanding of instructions, educational materials, and care plan provided today and DECLINED offer to receive copy of patient instructions, educational materials, and care plan.   The patient has been provided with contact information for the care management team and has been advised to call with any health related questions or concerns.   Allan Bacigalupi L. Noelle Penner, RN, BSN, CCM [facility] Care Management Community Coordinator Office number [phone]

## 2023-03-08 NOTE — Telephone Encounter (Signed)
LMTRC  JMiller,RMA 

## 2023-03-08 NOTE — Telephone Encounter (Signed)
LMTCB

## 2023-03-08 NOTE — Patient Outreach (Signed)
Care Coordination   Follow Up Visit Note   03/08/2023 Name: Emily Jennings MRN: [id number] DOB: 1934-06-21  Emily Jennings is a 89 y.o. year old female who sees Emily Jennings, Emily Mallick, MD for primary care. I spoke with  Emily Jennings by phone today.  What matters to the patients health and wellness today?  " I am doing good. I am able to do for myself but I think this  medicine is too strong"  Diabetes medicine does not like her diabetes medicines "turned me into a diabetic comma" " I took that fifteen" - referring to her Toujeo Reports she "fell into my refrigerator and my husband had to help me up" Has a knot on her hip  Reports she preferred to remain on her sliding scale insulin Novolog & Humalog 75/25 Feels best for her to take Toujeo at night vs the mornings as her monitor is able to catch hypoglycemia/syncope moments Had at least 4 episodes so far Not scheduled to see Dr Emily Jennings until August 2024 after a July visit was cancelled Opened to seeing MD earlier to adjust her medications Having mainly night hypoglycemia episodes down to 30's 36 no matter if she takes the Toujeo in the mornings or at night  Hypoglycemia symptoms includes -Poor concentration, sweating, syncope, fatigue Ensure increases her blood sure too high to the 300's She prefer to take the "slim" as it does not increase the blood sugar that high generally in the 200's She states the cost of Toujeo is more than her regimen of Humalog/Novolog She reports having insomnia issues related to taking her lasix   CBG 251 at time of the outreach (1000) but was 69 upon rising this morning 03/08/23  She is cautious with  use of peanut butter related to dietary management of her congestive Heart Failure (CHF). Chronic Kidney disease (CKD) & high cholesterol until education provided  She voices understanding of portion/size of food, high protein foods, eating a variety of foods    Goals Addressed             This Visit's  Progress    I will follow the HF Action plan daily and call my provider if I have a wt gain, increased SOB or edema over the next 30 days.   Not on track    Care Coordination Interventions: Interventions Today    Flowsheet Row Most Recent Value  Chronic Disease   Chronic disease during today's visit Diabetes  General Interventions   General Interventions Discussed/Reviewed General Interventions Reviewed, Labs, Sick Day Rules, Doctor Visits, Communication with  Labs Hgb A1c every 6 months  Doctor Visits Discussed/Reviewed Doctor Visits Reviewed, PCP, Specialist  Durable Medical Equipment (DME) Glucomoter  PCP/Specialist Visits Compliance with follow-up visit  Communication with --  [left a message for Dr [facility] assistant to return call to patient or this RN CM about her hypoglycemic episodes with use of Toujeo and patient interest in changing diabetes medicine management referred to 03/08/23 note]  Education Interventions   Education Provided --  [employer]  letter food labels, protein foods, DM/nutrition hypoglycemia]  Provided Verbal Education On Nutrition, Labs, Blood Sugar Monitoring, Medication  Labs Reviewed Hgb A1c, Lipid Profile  Mental Health Interventions   Mental Health Discussed/Reviewed Mental Health Discussed, Coping Strategies  Nutrition Interventions   Nutrition Discussed/Reviewed Nutrition Reviewed, Carbohydrate meal planning, Adding fruits and vegetables, Fluid intake, Portion sizes, Decreasing sugar intake, Increasing proteins, Decreasing fats, Decreasing salt, Supplemental nutrition  Pharmacy Interventions   Pharmacy Dicussed/Reviewed  Pharmacy Topics Reviewed, Medications and their functions, Affording Medications  Safety Interventions   Safety Discussed/Reviewed Safety Discussed, Home Safety             SDOH assessments and interventions completed:  No     Care Coordination Interventions:  Yes, provided   Follow up plan: Follow up call scheduled for  03/29/23    Encounter Outcome:  Pt. Visit Completed   Emily Jennings L. Noelle Penner, RN, BSN, CCM [facility] Care Management Community Coordinator Office number [phone]

## 2023-03-12 ENCOUNTER — Telehealth: Payer: Self-pay | Admitting: Family Medicine

## 2023-03-12 NOTE — Telephone Encounter (Signed)
Patient states that she has dropped the Toujeo to 5 units for the last 2 days and sugars have been better regulated without the lows.

## 2023-03-12 NOTE — Telephone Encounter (Signed)
LMTRC  JMiller,RMA 

## 2023-03-12 NOTE — Telephone Encounter (Signed)
Patient states that she is using sliding scale with Novolog

## 2023-03-12 NOTE — Telephone Encounter (Signed)
Pt called in regard to amLODipine-olmesartan (AZOR) 5-40 MG tablet   States that it is making legs ankles and feet swell after taking the med.  Patient states that  legs swell all day until fluid pill is  taken.   Patient wants a call back in regard in regards on what to do.

## 2023-03-13 NOTE — Telephone Encounter (Signed)
LMTRC-KG 

## 2023-03-14 NOTE — Telephone Encounter (Signed)
Patient aware.

## 2023-03-15 ENCOUNTER — Ambulatory Visit (HOSPITAL_COMMUNITY): Admission: RE | Admit: 2023-03-15 | Payer: Medicare Other | Source: Ambulatory Visit

## 2023-03-24 ENCOUNTER — Other Ambulatory Visit: Payer: Self-pay | Admitting: Family Medicine

## 2023-03-27 ENCOUNTER — Encounter: Payer: Medicare Other | Attending: Family Medicine | Admitting: Nutrition

## 2023-03-27 DIAGNOSIS — E1121 Type 2 diabetes mellitus with diabetic nephropathy: Secondary | ICD-10-CM | POA: Diagnosis not present

## 2023-03-27 DIAGNOSIS — N1832 Chronic kidney disease, stage 3b: Secondary | ICD-10-CM | POA: Insufficient documentation

## 2023-03-27 DIAGNOSIS — I5022 Chronic systolic (congestive) heart failure: Secondary | ICD-10-CM | POA: Insufficient documentation

## 2023-03-27 NOTE — Progress Notes (Unsigned)
Medical Nutrition Therapy  Appointment Start time:  1530  Appointment End time:  1700  Primary concerns today: Type 2 Dm  Referral diagnosis: E11.8 Preferred learning style: No preference  Learning readiness: Ready    NUTRITION ASSESSMENT  89  yr old bfemale here with her husband. Sees Dr. Lonzo Cloud for Endocrinology. PCP Dr. [name]. She notes her She notes she was recently in the hospital from her blood sugars 02-16-23.  She is here with her husband today. Downloaded her reader from her dexcom.. Her dexcom shows inconsistent blood sugars. After long discussion, it appears that when giving her insulin, it leaks out some. This is apparent in her dexcom showing her BS staying high even when she said she was giving her insulin.  Since being in the hospital, her insulin regimen has changed. She notes she is suppose to take 12 units of her Toujeo but she insists that is too much insulin and she gives herself only 6-8 depending on what her blood sugar is. She is also on Humalog. She notes the amounts she takes with meal changes depending on what she thinks she needs... maybe 3-5 units sometimes.   Her self adjusting of her insulins is very concerning and putting her at high risk for complications and risk for DKA and other problems.   Her husband is supportive of her but she insists she knows what she is suppose to be doing. Stressed the dangers of self adjusting her medications.  Advised her and her husband to make an appt with Dr. Lonzo Cloud to set up a sliding scale regimen and follow it as instructed.   I suggested she start with 7 units of her Toujeo at night since she wasn't willing to take 12 units and we could see what her blood sugars are accordingly.     Lab Results  Component Value Date   HGBA1C 6.9 (A) 12/25/2022      Latest Ref Rng & Units 02/27/2023    2:00 PM 02/21/2023    4:25 AM 02/20/2023    4:36 AM  CMP  Glucose 70 - 99 mg/dL 409  811  914   BUN 8 - 27 mg/dL 29  30  32    Creatinine 0.57 - 1.00 mg/dL 7.82  9.56  2.13   Sodium 134 - 144 mmol/L 136  137  137   Potassium 3.5 - 5.2 mmol/L 3.8  3.2  3.6   Chloride 96 - 106 mmol/L 95  102  103   CO2 20 - 29 mmol/L 30  28  26    Calcium 8.7 - 10.3 mg/dL 8.6  8.0  8.0      Anthropometrics  Wt Readings from Last 3 Encounters:  02/27/23 119 lb (54 kg)  02/21/23 121 lb 4.1 oz (55 kg)  02/16/23 116 lb (52.6 kg)   Ht Readings from Last 3 Encounters:  02/27/23 5' (1.524 m)  02/17/23 5' (1.524 m)  02/16/23 5' (1.524 m)   There is no height or weight on file to calculate BMI. @BMIFA @ Facility age limit for growth %iles is 20 years. Facility age limit for growth %iles is 20 years.    Clinical Medical Hx: CHF, DM Type 2 Medications: Farxiga and Humalog 75/25. Labs:  Lab Results  Component Value Date   HGBA1C 6.9 (A) 12/25/2022      Latest Ref Rng & Units 02/27/2023    2:00 PM 02/21/2023    4:25 AM 02/20/2023    4:36 AM  CMP  Glucose 70 -  99 mg/dL 161  096  045   BUN 8 - 27 mg/dL 29  30  32   Creatinine 0.57 - 1.00 mg/dL 4.09  8.11  9.14   Sodium 134 - 144 mmol/L 136  137  137   Potassium 3.5 - 5.2 mmol/L 3.8  3.2  3.6   Chloride 96 - 106 mmol/L 95  102  103   CO2 20 - 29 mmol/L 30  28  26    Calcium 8.7 - 10.3 mg/dL 8.6  8.0  8.0    Lipid Panel     Component Value Date/Time   CHOL 192 11/24/2021 0837   TRIG 43 11/24/2021 0837   HDL 118 11/24/2021 0837   CHOLHDL 1.6 11/24/2021 0837   CHOLHDL 1.8 06/29/2020 0916   VLDL 8 10/31/2016 0845   LDLCALC 65 11/24/2021 0837   LDLCALC 80 06/29/2020 0916   LABVLDL 9 11/24/2021 0837    Notable Signs/Symptoms:Low blood sugars sometimes   Lifestyle & Dietary Hx Lives with husband. She cooks  Estimated daily fluid intake: 30 oz Supplements:  Sleep: good Stress / self-care: none Current average weekly physical activity: ADL and walks some  24-Hr Dietary Recall First Meal: fruit Snack: crackers Second Meal: sometimes vegetables or salad,  water Snack:  Third Meal: meat and vegetables,  Snack:  Beverages: water  Estimated Energy Needs Calories: 1500 Carbohydrate: 170g Protein: 112g Fat: 42g   NUTRITION DIAGNOSIS  NB-1.1 Food and nutrition-related knowledge deficit As related to Dm Type 2.  As evidenced by A1C 6.9%..   NUTRITION INTERVENTION  Nutrition education (E-1) on the following topics:  Nutrition and Diabetes education provided on My Plate, CHO counting, meal planning, portion sizes, timing of meals, avoiding snacks between meals unless having a low blood sugar, target ranges for A1C and blood sugars, signs/symptoms and treatment of hyper/hypoglycemia, monitoring blood sugars, taking medications as prescribed, benefits of exercising 30 minutes per day and prevention of complications of DM.   Lifestyle Medicine  - Whole Food, Plant Predominant Nutrition is highly recommended: Eat Plenty of vegetables, Mushrooms, fruits, Legumes, Whole Grains, Nuts, seeds in lieu of processed meats, processed snacks/pastries red meat, poultry, eggs.    -It is better to avoid simple carbohydrates including: Cakes, Sweet Desserts, Ice Cream, Soda (diet and regular), Sweet Tea, Candies, Chips, Cookies, Store Bought Juices, Alcohol in Excess of  1-2 drinks a day, Lemonade,  Artificial Sweeteners, Doughnuts, Coffee Creamers, "Sugar-free" Products, etc, etc.  This is not a complete list.....  Exercise: If you are able: 30 -60 minutes a day ,4 days a week, or 150 minutes a week.  The longer the better.  Combine stretch, strength, and aerobic activities.  If you were told in the past that you have high risk for cardiovascular diseases, you may seek evaluation by your heart doctor prior to initiating moderate to intense exercise programs.  CGM Training: CGM Dexcom 7    Intervention:   Understanding Glucose Sensing Dexcom 7 Programming Sensor Information  High Glucose: 180 mg/dl  Low Glucose 80 mg/dl  Other settings to be added at follow  up visit Starting [employer] of Product  Entering BG, Calibration Technique and Graphs with Public librarian and Alarms   Follow Up Patient to see MD for insulin dose adjustments and to schedule visit with me for CGM follow up within 6  week(s).  Handouts Provided Include  Lifestyle Medicine G7 booklets   Learning Style & Readiness for Change  Teaching method utilized: Visual & Auditory  Demonstrated degree of understanding via: Teach Back  Barriers to learning/adherence to lifestyle change: none  Goals Established by Pt Eat three meals per day at times discussed Eat protein and healthy carbohydrates at each meals;= whole plant based foods Avoid low blood sugars Don't skip meals. Use Dexcom 7 to see how  your blood sugars are doing throughout the day based on what  you're eating and your insulin. Call MD if blood sugars are dropping below 80's or over 200 2-3 times in a row or in a week consistently.    MONITORING & EVALUATION Dietary intake, weekly physical activity, and blood sugars in 1 month.  Next Steps  Patient is to work on eating better balanced meals.Marland Kitchen

## 2023-03-29 ENCOUNTER — Encounter: Payer: Self-pay | Admitting: Nutrition

## 2023-03-29 ENCOUNTER — Ambulatory Visit: Payer: Self-pay | Admitting: *Deleted

## 2023-03-29 NOTE — Patient Outreach (Signed)
  Care Coordination   03/29/2023 Name: Emily Jennings MRN: [id number] DOB: [birth year]   Care Coordination Outreach Attempts:  An unsuccessful telephone outreach was attempted today to offer the patient information about available care coordination services.  Follow Up Plan:  Additional outreach attempts will be made to offer the patient care coordination information and services.   Encounter Outcome:  No Answer   Care Coordination Interventions:  No, not indicated    Aili Casillas L. Noelle Penner, RN, BSN, CCM [facility] Care Management Community Coordinator Office number [phone]

## 2023-03-29 NOTE — Patient Instructions (Addendum)
Visit Information  Thank you for taking time to visit with me today. Please don't hesitate to contact me if I can be of assistance to you.   Following are the goals we discussed today:   Goals Addressed             This Visit's Progress    Revised goal - I will manage my diet & insulin to prevent admissions. I will monitor for further kidney stones & contact providers for any changes. [facility] RN CM)   On track    Care Coordination Interventions: Interventions Today    Flowsheet Row Most Recent Value  Chronic Disease   Chronic disease during today's visit Chronic Kidney Disease/End Stage Renal Disease (ESRD), Diabetes, Other  [kidney stones]  General Interventions   General Interventions Discussed/Reviewed General Interventions Reviewed, Doctor Visits  Doctor Visits Discussed/Reviewed Doctor Visits Reviewed, Specialist, PCP  PCP/Specialist Visits Compliance with follow-up visit  Exercise Interventions   Exercise Discussed/Reviewed Exercise Discussed, Physical Activity  Physical Activity Discussed/Reviewed Physical Activity Discussed, Home Exercise Program (HEP)  Loni Beckwith active at home]  Education Interventions   Education Provided Provided Printed Education, Provided Education  [kidney stones]  Provided Verbal Education On Nutrition, Labs, When to see the doctor  Mental Health Interventions   Mental Health Discussed/Reviewed Mental Health Reviewed, Coping Strategies  Nutrition Interventions   Nutrition Discussed/Reviewed Nutrition Reviewed, Decreasing salt  Pharmacy Interventions   Pharmacy Dicussed/Reviewed Pharmacy Topics Reviewed, Affording Medications              Our next appointment is by telephone on 05/30/23 at 1000  Please call the care guide team at [phone] if you need to cancel or reschedule your appointment.   If you are experiencing a Mental Health or Behavioral Health Crisis or need someone to talk to, please call the Suicide and Crisis Lifeline: 988 call  the [place] National Suicide Prevention Lifeline: (952)880-9982 or TTY: 2540962011 TTY 906 886 4127) to talk to a trained counselor call 1-800-273-TALK (toll free, 24 hour hotline) call the [facility]: 502-653-2184 call 911   No computer access, no preference for copy of AVS  The patient has been provided with contact information for the care management team and has been advised to call with any health related questions or concerns.   Pressley Barsky L. Noelle Penner, RN, BSN, CCM [facility] Care Management Community Coordinator Office number [phone]

## 2023-03-29 NOTE — Patient Instructions (Addendum)
Goals  Eat three meals per day at times discussed Eat protein and healthy carbohydrates at each meals;= whole plant based foods Avoid low blood sugars Don't skip meals. Use Dexcom 7 to see how  your blood sugars are doing throughout the day based on what  you're eating and your insulin. Call MD if blood sugars are dropping below 80's or over 200 2-3 times in a row or in a week consistently.

## 2023-03-29 NOTE — Patient Instructions (Signed)
Visit Information  Thank you for taking time to visit with me today. Please don't hesitate to contact me if I can be of assistance to you.   Following are the goals we discussed today:   Goals Addressed             This Visit's Progress    Revised goal - I will manage my diet & insulin to prevent admissions. I will monitor for further kidney stones & contact providers for any changes. [facility] RN CM)       Care Coordination Interventions: Interventions Today    Flowsheet Row Most Recent Value  Chronic Disease   Chronic disease during today's visit Diabetes  General Interventions   General Interventions Discussed/Reviewed General Interventions Reviewed, Doctor Visits, Labs  Labs Hgb A1c every 6 months  Doctor Visits Discussed/Reviewed Doctor Visits Reviewed, PCP, Specialist  Durable Medical Equipment (DME) Glucomoter  PCP/Specialist Visits Compliance with follow-up visit  Education Interventions   Education Provided Provided Education  [diabetes nutrition]  Provided Verbal Education On Nutrition, Labs  Labs Reviewed Hgb A1c  Mental Health Interventions   Mental Health Discussed/Reviewed Mental Health Reviewed, Coping Strategies  Nutrition Interventions   Nutrition Discussed/Reviewed Nutrition Reviewed, Carbohydrate meal planning, Adding fruits and vegetables, Fluid intake, Portion sizes, Decreasing sugar intake, Increasing proteins, Decreasing fats, Decreasing salt  Pharmacy Interventions   Pharmacy Dicussed/Reviewed Pharmacy Topics Discussed              Our next appointment is by telephone on 03/08/23 at 1000  Please call the care guide team at [phone] if you need to cancel or reschedule your appointment.   If you are experiencing a Mental Health or Behavioral Health Crisis or need someone to talk to, please call the Suicide and Crisis Lifeline: 988 call the [place] National Suicide Prevention Lifeline: 380-305-3293 or TTY: 208-643-1269 TTY 863-867-7294) to talk to a  trained counselor call 1-800-273-TALK (toll free, 24 hour hotline) call the [facility]: 629-112-6694 call 911   No computer access, no preference for copy of AVS    The patient has been provided with contact information for the care management team and has been advised to call with any health related questions or concerns.   Xylon Croom L. Noelle Penner, RN, BSN, CCM [facility] Care Management Community Coordinator Office number [phone]

## 2023-03-29 NOTE — Patient Outreach (Signed)
  Care Coordination   Follow Up Visit Note   03/29/2023 Name: Emily Jennings MRN: [id number] DOB: 10-Dec-1933  Emily Jennings is a 89 y.o. year old female who sees Emily Perches, MD for primary care. I spoke with  Emily Jennings by phone today. Patient returned a call to RN CM   What matters to the patients health and wellness today?  "Doing well. I am now seeing a dietitian" since 03/27/23  diabetes - Feels she was on too much insulin. The doses were decreased by the patient/ She does not prefer to take 15 units any more related to recent experience of "diabetic coma", emesis, led to an ED visit, kidney stones  Nephrology to be seen April 08, 2023 She believes she has passed the stone as she does not have any further pain    Goals Addressed             This Visit's Progress    Revised goal - I will manage my diet & insulin to prevent admissions. I will monitor for further kidney stones & contact providers for any changes. [facility] RN CM)   On track    Care Coordination Interventions: Interventions Today    Flowsheet Row Most Recent Value  Chronic Disease   Chronic disease during today's visit Chronic Kidney Disease/End Stage Renal Disease (ESRD), Diabetes, Other  [kidney stones]  General Interventions   General Interventions Discussed/Reviewed General Interventions Reviewed, Doctor Visits  Doctor Visits Discussed/Reviewed Doctor Visits Reviewed, Specialist, PCP  PCP/Specialist Visits Compliance with follow-up visit  Exercise Interventions   Exercise Discussed/Reviewed Exercise Discussed, Physical Activity  Physical Activity Discussed/Reviewed Physical Activity Discussed, Home Exercise Program (HEP)  Emily Jennings active at home]  Education Interventions   Education Provided Provided Printed Education, Provided Education  [kidney stones]  Provided Verbal Education On Nutrition, Labs, When to see the doctor  Mental Health Interventions   Mental Health Discussed/Reviewed Mental Health Reviewed,  Coping Strategies  Nutrition Interventions   Nutrition Discussed/Reviewed Nutrition Reviewed, Decreasing salt  Pharmacy Interventions   Pharmacy Dicussed/Reviewed Pharmacy Topics Reviewed, Affording Medications              SDOH assessments and interventions completed:  No     Care Coordination Interventions:  Yes, provided   Follow up plan: Follow up call scheduled for 05/30/23    Encounter Outcome:  Pt. Visit Completed   Emily Jennings L. Noelle Penner, RN, BSN, CCM [facility] Care Management Community Coordinator Office number [phone]

## 2023-03-30 DIAGNOSIS — E1122 Type 2 diabetes mellitus with diabetic chronic kidney disease: Secondary | ICD-10-CM | POA: Diagnosis not present

## 2023-03-30 DIAGNOSIS — N1330 Unspecified hydronephrosis: Secondary | ICD-10-CM | POA: Diagnosis not present

## 2023-03-30 DIAGNOSIS — R808 Other proteinuria: Secondary | ICD-10-CM | POA: Diagnosis not present

## 2023-03-30 DIAGNOSIS — N184 Chronic kidney disease, stage 4 (severe): Secondary | ICD-10-CM | POA: Diagnosis not present

## 2023-04-02 DIAGNOSIS — E1142 Type 2 diabetes mellitus with diabetic polyneuropathy: Secondary | ICD-10-CM | POA: Diagnosis not present

## 2023-04-03 ENCOUNTER — Encounter: Payer: Self-pay | Admitting: Family Medicine

## 2023-04-03 ENCOUNTER — Ambulatory Visit: Payer: Medicare Other | Admitting: Family Medicine

## 2023-04-03 VITALS — BP 180/80 | HR 70 | Ht 60.0 in | Wt 110.0 lb

## 2023-04-03 DIAGNOSIS — R011 Cardiac murmur, unspecified: Secondary | ICD-10-CM

## 2023-04-03 DIAGNOSIS — I5033 Acute on chronic diastolic (congestive) heart failure: Secondary | ICD-10-CM

## 2023-04-03 DIAGNOSIS — E1165 Type 2 diabetes mellitus with hyperglycemia: Secondary | ICD-10-CM | POA: Diagnosis not present

## 2023-04-03 DIAGNOSIS — I1A0 Resistant hypertension: Secondary | ICD-10-CM

## 2023-04-03 DIAGNOSIS — R930 Abnormal findings on diagnostic imaging of skull and head, not elsewhere classified: Secondary | ICD-10-CM | POA: Diagnosis not present

## 2023-04-03 DIAGNOSIS — R2681 Unsteadiness on feet: Secondary | ICD-10-CM

## 2023-04-03 DIAGNOSIS — E785 Hyperlipidemia, unspecified: Secondary | ICD-10-CM | POA: Diagnosis not present

## 2023-04-03 MED ORDER — HYDRALAZINE HCL 10 MG PO TABS
10.0000 mg | ORAL_TABLET | Freq: Three times a day (TID) | ORAL | 1 refills | Status: DC
Start: 2023-04-03 — End: 2023-04-24

## 2023-04-03 NOTE — Patient Instructions (Signed)
F/u as before, call if you need me sooner  You are being referred for a brain  scan due to abnormal gait, uncontrolled hypertension and h/o old infarcts, we will call with appointment info  NEW ADDITIONAL BP medication is hydralazine 10 mg take one tablet every 8 hours try to keep on that schedule for best blood pressure control  Continue carvedilol every 12 hours and spironolactone once daily  Thanks for choosing [facility], we consider it a privelige to serve you.

## 2023-04-03 NOTE — Assessment & Plan Note (Signed)
 Hyperlipidemia:Low fat diet discussed and encouraged.   Lipid Panel  Lab Results  Component Value Date   CHOL 192 11/24/2021   HDL 118 11/24/2021   LDLCALC 65 11/24/2021   TRIG 43 11/24/2021   CHOLHDL 1.6 11/24/2021     Updated lab needed at/ before next visit.

## 2023-04-03 NOTE — Assessment & Plan Note (Addendum)
Uncontrolled , start hydralazine 10 mg tid ,and continue current meds DASH diet and commitment to daily physical activity for a minimum of 30 minutes discussed and encouraged, as a part of hypertension management. The importance of attaining a healthy weight is also discussed.     04/03/2023    4:32 PM 04/03/2023    4:27 PM 04/03/2023    4:07 PM 04/03/2023    4:05 PM 02/27/2023    1:36 PM 02/27/2023    1:26 PM 02/27/2023    1:15 PM  BP/Weight  Systolic BP 180 178 197 191 144 148 156  Diastolic BP 80 86 81 81 70 66 66  Wt. (Lbs)    110.04     BMI    21.49 kg/m2

## 2023-04-03 NOTE — Assessment & Plan Note (Signed)
[name] is reminded of the importance of commitment to daily physical activity for 30 minutes or more, as able and the need to limit carbohydrate intake to 30 to 60 grams per meal to help with blood sugar control.   The need to take medication as prescribed, test blood sugar as directed, and to call between visits if there is a concern that blood sugar is uncontrolled is also discussed.   [name] is reminded of the importance of daily foot exam, annual eye examination, and good blood sugar, blood pressure and cholesterol control.     Latest Ref Rng & Units 02/27/2023    2:00 PM 02/21/2023    4:25 AM 02/20/2023    4:36 AM 02/19/2023    5:00 AM 02/18/2023    2:31 PM  Diabetic Labs  Creatinine 0.57 - 1.00 mg/dL 2.84  1.32  4.40  1.02  2.20       04/03/2023    4:32 PM 04/03/2023    4:27 PM 04/03/2023    4:07 PM 04/03/2023    4:05 PM 02/27/2023    1:36 PM 02/27/2023    1:26 PM 02/27/2023    1:15 PM  BP/Weight  Systolic BP 180 178 197 191 144 148 156  Diastolic BP 80 86 81 81 70 66 66  Wt. (Lbs)    110.04     BMI    21.49 kg/m2         Latest Ref Rng & Units 11/10/2020    1:18 PM 11/10/2020   12:00 AM  Foot/eye exam completion dates  Eye Exam No Retinopathy No Retinopathy     No Retinopathy         This result is from an external source.      Managed by Endo and uncontrolled currently reports repeated episodes of hypoglycemia

## 2023-04-03 NOTE — Assessment & Plan Note (Signed)
Currently stable, no decompensation

## 2023-04-03 NOTE — Progress Notes (Signed)
Emily Jennings     MRN: [id number]      DOB: May 16, 1934  Chief Complaint  Patient presents with   Follow-up    Bp high, patient reports low pulse and oxygen rate at home and stumbling     HPI Emily Jennings is here with above concerns, also reports several episdoes of hypoglycemia over past 2 to 5 weeks, has  Endo appt upcoming ROS Denies recent fever or chills. Denies sinus pressure, nasal congestion, ear pain or sore throat. Denies chest congestion, productive cough or wheezing. Denies chest pains, palpitations and leg swelling Denies abdominal pain, nausea, vomiting,diarrhea or constipation.   Denies dysuria, frequency, hesitancy or incontinence. a. Denies skin break down or rash.   PE  BP (!) 180/80   Pulse 70   Ht 5' (1.524 m)   Wt 110 lb 0.6 oz (49.9 kg)   SpO2 94%   BMI 21.49 kg/m   Patient alert and oriented and in no pulmonary distress.  HEENT: No facial asymmetry, EOMI,     Neck supple .  Chest: Clear to auscultation bilaterally.  CVS: S1, S2 systolic murmur murmurs, no S3.Regular rate.  ABD: Soft non tender.   Ext: No edema  MS: Adequate ROM spine, shoulders, hips and knees.  Skin: Intact, no ulcerations or rash noted.  Psych: Good eye contact, normal affect.  anxious and not  depressed appearing.  CNS: CN 2-12 intact, power,  normal throughout.no focal deficits noted.   Assessment & Plan  Resistant hypertension Uncontrolled , start hydralazine 10 mg tid ,and continue current meds DASH diet and commitment to daily physical activity for a minimum of 30 minutes discussed and encouraged, as a part of hypertension management. The importance of attaining a healthy weight is also discussed.     04/03/2023    4:32 PM 04/03/2023    4:27 PM 04/03/2023    4:07 PM 04/03/2023    4:05 PM 02/27/2023    1:36 PM 02/27/2023    1:26 PM 02/27/2023    1:15 PM  BP/Weight  Systolic BP 180 178 197 191 144 148 156  Diastolic BP 80 86 81 81 70 66 66  Wt. (Lbs)    110.04      BMI    21.49 kg/m2           Hyperglycemic crisis due to diabetes mellitus [facility]) Emily Jennings is reminded of the importance of commitment to daily physical activity for 30 minutes or more, as able and the need to limit carbohydrate intake to 30 to 60 grams per meal to help with blood sugar control.   The need to take medication as prescribed, test blood sugar as directed, and to call between visits if there is a concern that blood sugar is uncontrolled is also discussed.   Emily Jennings is reminded of the importance of daily foot exam, annual eye examination, and good blood sugar, blood pressure and cholesterol control.     Latest Ref Rng & Units 02/27/2023    2:00 PM 02/21/2023    4:25 AM 02/20/2023    4:36 AM 02/19/2023    5:00 AM 02/18/2023    2:31 PM  Diabetic Labs  Creatinine 0.57 - 1.00 mg/dL 3.47  4.25  9.56  3.87  2.20       04/03/2023    4:32 PM 04/03/2023    4:27 PM 04/03/2023    4:07 PM 04/03/2023    4:05 PM 02/27/2023    1:36 PM 02/27/2023  1:26 PM 02/27/2023    1:15 PM  BP/Weight  Systolic BP 180 178 197 191 144 148 156  Diastolic BP 80 86 81 81 70 66 66  Wt. (Lbs)    110.04     BMI    21.49 kg/m2         Latest Ref Rng & Units 11/10/2020    1:18 PM 11/10/2020   12:00 AM  Foot/eye exam completion dates  Eye Exam No Retinopathy No Retinopathy     No Retinopathy         This result is from an external source.      Managed by Endo and uncontrolled currently reports repeated episodes of hypoglycemia  Unsteady gait when walking C/o increased unsteadiness and fear of falling worse in past 2 to 3 months, needs brain scan, has old lacunar infarcts on previous head CT scan  Hyperlipidemia LDL goal <100 Hyperlipidemia:Low fat diet discussed and encouraged.   Lipid Panel  Lab Results  Component Value Date   CHOL 192 11/24/2021   HDL 118 11/24/2021   LDLCALC 65 11/24/2021   TRIG 43 11/24/2021   CHOLHDL 1.6 11/24/2021     Updated lab needed at/ before next  visit.   CHF (congestive heart failure) (HCC) Currently stable, no decompensation

## 2023-04-03 NOTE — Assessment & Plan Note (Signed)
C/o increased unsteadiness and fear of falling worse in past 2 to 3 months, needs brain scan, has old lacunar infarcts on previous head CT scan

## 2023-04-05 ENCOUNTER — Ambulatory Visit: Payer: Medicare Other | Admitting: Nutrition

## 2023-04-14 ENCOUNTER — Emergency Department (HOSPITAL_COMMUNITY)
Admission: EM | Admit: 2023-04-14 | Discharge: 2023-04-14 | Disposition: A | Discharge status: Home / Self Care | Payer: Medicare Other | Attending: Emergency Medicine | Admitting: Emergency Medicine

## 2023-04-14 ENCOUNTER — Encounter (HOSPITAL_COMMUNITY): Payer: Self-pay | Admitting: Emergency Medicine

## 2023-04-14 ENCOUNTER — Other Ambulatory Visit: Payer: Self-pay

## 2023-04-14 DIAGNOSIS — Z794 Long term (current) use of insulin: Secondary | ICD-10-CM | POA: Diagnosis not present

## 2023-04-14 DIAGNOSIS — Z79899 Other long term (current) drug therapy: Secondary | ICD-10-CM | POA: Insufficient documentation

## 2023-04-14 DIAGNOSIS — I10 Essential (primary) hypertension: Secondary | ICD-10-CM | POA: Insufficient documentation

## 2023-04-14 DIAGNOSIS — E1165 Type 2 diabetes mellitus with hyperglycemia: Secondary | ICD-10-CM | POA: Insufficient documentation

## 2023-04-14 DIAGNOSIS — Z7984 Long term (current) use of oral hypoglycemic drugs: Secondary | ICD-10-CM | POA: Diagnosis not present

## 2023-04-14 DIAGNOSIS — Z7982 Long term (current) use of aspirin: Secondary | ICD-10-CM | POA: Insufficient documentation

## 2023-04-14 DIAGNOSIS — R739 Hyperglycemia, unspecified: Secondary | ICD-10-CM | POA: Diagnosis present

## 2023-04-14 LAB — BASIC METABOLIC PANEL
Anion gap: 11 (ref 5–15)
BUN: 31 mg/dL — ABNORMAL HIGH (ref 8–23)
CO2: 22 mmol/L (ref 22–32)
Calcium: 8.5 mg/dL — ABNORMAL LOW (ref 8.9–10.3)
Chloride: 98 mmol/L (ref 98–111)
Creatinine, Ser: 1.61 mg/dL — ABNORMAL HIGH (ref 0.44–1.00)
GFR, Estimated: 31 mL/min — ABNORMAL LOW (ref >60)
Glucose, Bld: 389 mg/dL — ABNORMAL HIGH (ref 70–99)
Potassium: 3.8 mmol/L (ref 3.5–5.1)
Sodium: 131 mmol/L — ABNORMAL LOW (ref 135–145)

## 2023-04-14 LAB — BLOOD GAS, VENOUS
Acid-base deficit: 0.6 mmol/L (ref 0.0–2.0)
Bicarbonate: 25.4 mmol/L (ref 20.0–28.0)
Drawn by: 57579
O2 Saturation: 78.1 %
Patient temperature: 36.6
pCO2, Ven: 45 mmHg (ref 44–60)
pH, Ven: 7.36 (ref 7.25–7.43)
pO2, Ven: 45 mmHg (ref 32–45)

## 2023-04-14 LAB — URINALYSIS, ROUTINE W REFLEX MICROSCOPIC
Bacteria, UA: NONE SEEN
Bilirubin Urine: NEGATIVE
Glucose, UA: >=500 mg/dL — AB
Hgb urine dipstick: NEGATIVE
Ketones, ur: 5 mg/dL — AB
Leukocytes,Ua: NEGATIVE
Nitrite: NEGATIVE
Protein, ur: >=300 mg/dL — AB
Specific Gravity, Urine: 1.012 (ref 1.005–1.030)
pH: 6 (ref 5.0–8.0)

## 2023-04-14 LAB — CBG MONITORING, ED
Glucose-Capillary: 363 mg/dL — ABNORMAL HIGH (ref 70–99)
Glucose-Capillary: 327 mg/dL — ABNORMAL HIGH (ref 70–99)

## 2023-04-14 LAB — CBC
HCT: 36.9 % (ref 36.0–46.0)
Hemoglobin: 12.2 g/dL (ref 12.0–15.0)
MCH: 30.6 pg (ref 26.0–34.0)
MCHC: 33.1 g/dL (ref 30.0–36.0)
MCV: 92.5 fL (ref 80.0–100.0)
Platelets: 207 10*3/uL (ref 150–400)
RBC: 3.99 MIL/uL (ref 3.87–5.11)
RDW: 14.6 % (ref 11.5–15.5)
WBC: 5.7 10*3/uL (ref 4.0–10.5)
nRBC: 0 % (ref 0.0–0.2)

## 2023-04-14 MED ORDER — TOUJEO SOLOSTAR 300 UNIT/ML ~~LOC~~ SOPN: 15.0000 [IU] | PEN_INJECTOR | Freq: Every day | SUBCUTANEOUS | 3 refills | Status: DC

## 2023-04-14 MED ORDER — INSULIN ASPART 100 UNIT/ML IJ SOLN
10.0000 [IU] | Freq: Once | INTRAMUSCULAR | Status: AC
  Administered 2023-04-14: 10 [IU] via SUBCUTANEOUS
  Filled 2023-04-14: qty 1

## 2023-04-14 NOTE — ED Provider Notes (Signed)
[place] EMERGENCY DEPARTMENT AT [facility]  Provider Note  CSN: [id number] Arrival date & time: 04/14/23 0425  History Chief Complaint  Patient presents with   Hyperglycemia    Emily Jennings is a 89 y.o. female with history of poorly controlled HTN and DM recently started seeing a new endocrinologist who started her on Toujeo as well as humalog. She reports she was previously on insulin 75/25 and preferred that regimen although she did not have good control then. She presents tonight with elevated glucose. States her Toujeo pen was empty and she did not get her usual evening dose. She reports she has a refill available but she doesn't want to take it because she doesn't think it is the correct medication for her. Review of notes from EMR show she already decreased her dose from 15 units to 5 units. She denies any N/V, CP, SOB.    Home Medications Prior to Admission medications   Medication Sig Start Date End Date Taking? Authorizing Provider  alendronate (FOSAMAX) 70 MG tablet TAKE 1 TABLET BY MOUTH EVERY 7 DAYS. TAKE WITH A FULL GLASS OF WATER ON AN EMPTY STOMACH 01/04/22   [name], MD  amLODipine-olmesartan (AZOR) 5-40 MG tablet Take 1 tablet by mouth daily. 01/18/23   Strader, Lennart Pall, PA-C  aspirin EC 81 MG tablet Take 1 tablet (81 mg total) by mouth daily with breakfast. 09/17/22   Emokpae, Courage, MD  blood glucose meter kit and supplies KIT One touch Ultra. Use three times daily as directed. (FOR ICD E11.65) 10/27/20   Roma Kayser, MD  calcitRIOL (ROCALTROL) 0.25 MCG capsule Take 0.25 mcg by mouth 3 (three) times a week.    [provider]  carvedilol (COREG) 12.5 MG tablet Take two tablets by mouth two times daily 04/03/23   [name], MD  Continuous Blood Gluc Sensor (DEXCOM G7 SENSOR) MISC 1 Device by Does not apply route as directed. 12/25/22   Shamleffer, Konrad Dolores, MD  FARXIGA 10 MG TABS tablet Take 10 mg by mouth every  morning. 11/13/22   [provider]  furosemide (LASIX) 40 MG tablet Take 1 tablet (40 mg total) by mouth 2 (two) times daily. 10/09/22   Sharlene Dory, NP  glucose blood [facility] ULTRA) test strip USE TO TEST 4 TIMES A DAY 01/09/23   Roma Kayser, MD  hydrALAZINE (APRESOLINE) 10 MG tablet Take 1 tablet (10 mg total) by mouth 3 (three) times daily. 04/03/23   [name], MD  insulin glargine, 1 Unit Dial, (TOUJEO SOLOSTAR) 300 UNIT/ML Solostar Pen Inject 15 Units into the skin daily. 02/23/23   Shamleffer, Konrad Dolores, MD  insulin lispro (HUMALOG KWIKPEN) 100 UNIT/ML KwikPen Max daily 30 units 02/23/23   Shamleffer, Konrad Dolores, MD  Insulin Pen Needle 32G X 4 MM MISC 1 each by Does not apply route in the morning and at bedtime. 12/25/22   Shamleffer, Konrad Dolores, MD  OneTouch Delica Lancets 33G MISC 1 each by Does not apply route 3 (three) times daily. Use to check glucose three times daily 10/03/22   Roma Kayser, MD  Potassium Chloride ER 20 MEQ TBCR TAKE 1 TABLET DAILY BY MOUTH TAKE WHILE TAKING LASIX/FUROSEMIDE 03/26/23   [name], MD  rosuvastatin (CRESTOR) 5 MG tablet TAKE 1 TABLET (5 MG TOTAL) BY MOUTH DAILY. 01/15/23   [name], MD  spironolactone (ALDACTONE) 50 MG tablet Take 50 mg by mouth daily. 11/19/22  [provider]     Allergies    Benadryl [diphenhydramine hcl], Citalopram, Metformin and related, Tramadol, and Crestor [rosuvastatin]   Review of Systems   Review of Systems Please see HPI for pertinent positives and negatives  Physical Exam BP (!) 179/75   Pulse 75   Temp 97.9 F (36.6 C) (Oral)   Resp 10   Ht 5' (1.524 m)   Wt 50 kg   SpO2 97%   BMI 21.53 kg/m   Physical Exam Vitals and nursing note reviewed.  Constitutional:      Appearance: Normal appearance.  HENT:     Head: Normocephalic and atraumatic.     Nose: Nose normal.     Mouth/Throat:     Mouth: Mucous membranes are moist.   Eyes:     Extraocular Movements: Extraocular movements intact.     Conjunctiva/sclera: Conjunctivae normal.  Cardiovascular:     Rate and Rhythm: Normal rate.  Pulmonary:     Effort: Pulmonary effort is normal.     Breath sounds: Normal breath sounds.  Abdominal:     General: Abdomen is flat.     Palpations: Abdomen is soft.     Tenderness: There is no abdominal tenderness.  Musculoskeletal:        General: No swelling. Normal range of motion.     Cervical back: Neck supple.  Skin:    General: Skin is warm and dry.  Neurological:     General: No focal deficit present.     Mental Status: She is alert.  Psychiatric:        Mood and Affect: Mood normal.     ED Results / Procedures / Treatments   EKG None  Procedures Procedures  Medications Ordered in the ED Medications  insulin aspart (novoLOG) injection 10 Units (10 Units Subcutaneous Given 04/14/23 0546)    Initial Impression and Plan  Patient here with hyperglycemia in setting of medication non-compliance. She is relatively asymptomatic. She is also noted to be hypertensive, has been in the 180s at several recent doctor's visits. Will check labs to eval signs of DKA or end organ damage. Patient was encouraged to continue the regimen prescribed by her doctors and to discuss adjustments with them before stopping or changing the medications on her own.   ED Course   Clinical Course as of 04/14/23 0621  Sat Apr 14, 2023  0513 CBC is normal.  [CS]  0534 VBG is unremarkable.  [CS]  0534 BMP shows CKD about at baseline. Glucose is elevated, but no acidosis to suggest DKA. Will give a dose of SQ insulin.  [CS]  0539 UA with glucose and protein, but otherwise no signs of infection.  [CS]  0618 Patient's BP has returned to baseline without intervention. I again stressed the importance of taking the medications as prescribed by her doctors so that adjustments can be made appropriately. Otherwise she has no indication for  admission, labs are at baseline. PCP follow up, RTED for any other concerns.   [CS]    Clinical Course User Index [CS] Pollyann Savoy, MD     MDM Rules/Calculators/A&P Medical Decision Making Given presenting complaint, I considered that admission might be necessary. After review of results from ED lab and/or imaging studies, admission to the hospital is not indicated at this time.    Problems Addressed: Hyperglycemia: acute illness or injury  Amount and/or Complexity of Data Reviewed Labs: ordered. Decision-making details documented in ED Course.  Risk Prescription drug management. Decision  regarding hospitalization.     Final Clinical Impression(s) / ED Diagnoses Final diagnoses:  Hyperglycemia    Rx / DC Orders ED Discharge Orders     None        Pollyann Savoy, MD 04/14/23 [phone]

## 2023-04-14 NOTE — ED Notes (Signed)
ED Provider at bedside. 

## 2023-04-14 NOTE — ED Triage Notes (Signed)
Pt states her "sugars" have been running high since after lunch yesterday. States she recently switched MD's and her insulin regimen was changed.

## 2023-04-14 NOTE — ED Notes (Signed)
Patient denies pain and is resting comfortably. Pt provided warm blankets at this time.

## 2023-04-17 ENCOUNTER — Other Ambulatory Visit: Payer: Self-pay

## 2023-04-17 ENCOUNTER — Telehealth: Payer: Self-pay | Admitting: "Endocrinology

## 2023-04-17 ENCOUNTER — Telehealth: Payer: Self-pay | Admitting: Family Medicine

## 2023-04-17 DIAGNOSIS — E1165 Type 2 diabetes mellitus with hyperglycemia: Secondary | ICD-10-CM

## 2023-04-17 NOTE — Telephone Encounter (Signed)
Patient is unhappy with current diabetes referral. Thinks that provider is giving too much Insuline. Wants to be referred back to Dr. Fransico Him   Wants a call back

## 2023-04-17 NOTE — Telephone Encounter (Signed)
Called pt per Referral Coordinator that they received a referral on pt to come back and see Dr Fransico Him. I called pt to schedule a follow up , as she would not be a new pt, does not need a new referral

## 2023-04-17 NOTE — Telephone Encounter (Signed)
Referral sent 

## 2023-04-19 ENCOUNTER — Telehealth: Payer: Self-pay

## 2023-04-19 NOTE — Telephone Encounter (Signed)
Transition Care Management Follow-up Telephone Call Date of discharge and from where: 04/14/2023 [facility] How have you been since you were released from the hospital? Patient stated she is feeling much better. Any questions or concerns? No  Items Reviewed: Did the pt receive and understand the discharge instructions provided? Yes  Medications obtained and verified?  No medication prescribed. Other? No  Any new allergies since your discharge? No  Dietary orders reviewed? Yes Do you have support at home? Yes   Follow up appointments reviewed:  PCP Hospital f/u appt confirmed? Yes  Scheduled to see [name]. [name], MD on 04/24/2023 @ [facility]. Specialist Hospital f/u appt confirmed? No  Scheduled to see  on  @ . Are transportation arrangements needed? No  If their condition worsens, is the pt aware to call PCP or go to the Emergency Dept.? Yes Was the patient provided with contact information for the PCP's office or ED? Yes Was to pt encouraged to call back with questions or concerns? Yes  Giorgio Chabot [facility]  [facility] Population Health Community Resource Care Guide   [email or web address]  ?? [phone]   Website: [email or web address]  [email or web address]

## 2023-04-24 ENCOUNTER — Encounter: Payer: Self-pay | Admitting: Family Medicine

## 2023-04-24 ENCOUNTER — Ambulatory Visit (INDEPENDENT_AMBULATORY_CARE_PROVIDER_SITE_OTHER): Payer: Medicare Other | Admitting: Family Medicine

## 2023-04-24 VITALS — BP 167/73 | HR 74 | Ht 60.0 in | Wt 108.1 lb

## 2023-04-24 DIAGNOSIS — E785 Hyperlipidemia, unspecified: Secondary | ICD-10-CM | POA: Diagnosis not present

## 2023-04-24 DIAGNOSIS — I1A0 Resistant hypertension: Secondary | ICD-10-CM

## 2023-04-24 DIAGNOSIS — Z794 Long term (current) use of insulin: Secondary | ICD-10-CM

## 2023-04-24 DIAGNOSIS — E1122 Type 2 diabetes mellitus with diabetic chronic kidney disease: Secondary | ICD-10-CM | POA: Diagnosis not present

## 2023-04-24 DIAGNOSIS — N1831 Chronic kidney disease, stage 3a: Secondary | ICD-10-CM

## 2023-04-24 MED ORDER — HYDRALAZINE HCL 25 MG PO TABS: ORAL_TABLET | ORAL | 2 refills | Status: DC

## 2023-04-24 NOTE — Patient Instructions (Signed)
F/U in 5 tio 6 weeks, re evaluate blood pressure  STOP hydralazine 10 mg  New is hydralazine 25 mg one tablet two times daily, 8:30 in the morning and 8:30 AT NIGHT  cONTINUE CARVEDILOL 12.5 MG TWO TABLET TWICE DAILY  cONTINUE SPIRONOLACTONE 25 MG ONE DAILY  Thanks for choosing [facility], we consider it a privelige to serve you.

## 2023-04-25 ENCOUNTER — Ambulatory Visit (HOSPITAL_COMMUNITY)
Admission: RE | Admit: 2023-04-25 | Discharge: 2023-04-25 | Disposition: A | Discharge status: Home / Self Care | Payer: Medicare Other | Source: Ambulatory Visit | Attending: Family Medicine | Admitting: Family Medicine

## 2023-04-25 DIAGNOSIS — R2681 Unsteadiness on feet: Secondary | ICD-10-CM | POA: Insufficient documentation

## 2023-04-25 DIAGNOSIS — I6782 Cerebral ischemia: Secondary | ICD-10-CM | POA: Diagnosis not present

## 2023-04-25 DIAGNOSIS — R930 Abnormal findings on diagnostic imaging of skull and head, not elsewhere classified: Secondary | ICD-10-CM | POA: Insufficient documentation

## 2023-04-25 DIAGNOSIS — I639 Cerebral infarction, unspecified: Secondary | ICD-10-CM | POA: Diagnosis not present

## 2023-04-25 DIAGNOSIS — Z8673 Personal history of transient ischemic attack (TIA), and cerebral infarction without residual deficits: Secondary | ICD-10-CM | POA: Diagnosis not present

## 2023-05-01 ENCOUNTER — Encounter: Payer: Self-pay | Admitting: Internal Medicine

## 2023-05-01 ENCOUNTER — Ambulatory Visit: Payer: Medicare Other | Admitting: Internal Medicine

## 2023-05-01 VITALS — BP 160/80 | HR 56 | Ht 60.0 in | Wt 116.0 lb

## 2023-05-01 DIAGNOSIS — E1142 Type 2 diabetes mellitus with diabetic polyneuropathy: Secondary | ICD-10-CM

## 2023-05-01 DIAGNOSIS — N184 Chronic kidney disease, stage 4 (severe): Secondary | ICD-10-CM

## 2023-05-01 DIAGNOSIS — E1122 Type 2 diabetes mellitus with diabetic chronic kidney disease: Secondary | ICD-10-CM | POA: Diagnosis not present

## 2023-05-01 DIAGNOSIS — Z794 Long term (current) use of insulin: Secondary | ICD-10-CM

## 2023-05-01 DIAGNOSIS — I10 Essential (primary) hypertension: Secondary | ICD-10-CM | POA: Diagnosis not present

## 2023-05-01 LAB — POCT GLYCOSYLATED HEMOGLOBIN (HGB A1C): Hemoglobin A1C: 7.5 % — AB (ref 4.0–5.6)

## 2023-05-01 MED ORDER — TRESIBA FLEXTOUCH 100 UNIT/ML ~~LOC~~ SOPN: 5.0000 [IU] | PEN_INJECTOR | Freq: Every day | SUBCUTANEOUS | 1 refills | Status: DC

## 2023-05-01 MED ORDER — MUPIROCIN 2 % EX OINT: 1.0000 | TOPICAL_OINTMENT | Freq: Two times a day (BID) | CUTANEOUS | 0 refills | Status: DC

## 2023-05-01 MED ORDER — INSULIN LISPRO (1 UNIT DIAL) 100 UNIT/ML (KWIKPEN): 5.0000 [IU] | PEN_INJECTOR | Freq: Three times a day (TID) | SUBCUTANEOUS | 3 refills | Status: DC

## 2023-05-01 NOTE — Assessment & Plan Note (Signed)
Reports marked fluctuations in blood sugar and statesafter her next visit with current Endo she willdecide whereshe wants ot be seen.Of note , referral has alreafy been placed to Dr Fransico Him at her request

## 2023-05-01 NOTE — Progress Notes (Incomplete)
H&P  Chief Complaint: ***  History of Present Illness: Emily Jennings is a 89 y.o. year old female ***  Past Medical History:  Diagnosis Date   Allergy    Anxiety    ANXIETY DISORDER, GENERALIZED 07/15/2007   Qualifier: Diagnosis of  By: Chipper Herb     Arthritis    Cancer [facility]) 2009   breast, carcinoma in situ left   Carcinoma in situ of breast 05/21/2008   Qualifier: Diagnosis of  By: [name] MD, Margaret  Diagnosed in 2009, completed 5 year course of tamoxifen, no evidence of recurrence    Carotid stenosis    11/16/2005  mild plaque formation and stenosis proximal right ECA   Cataract    Complication of anesthesia    Coronary artery disease    cardiac catheterization on 03/20/2006  LAD mid 40% stenosis, left circumflex mild 40% stenosis, RCA mid-vessel 40% to 50% lesion   EF 60%   Diabetes mellitus    GERD (gastroesophageal reflux disease)    Hernia, inguinal    left   Hyperglycemia    Hypertension    Insomnia 11/16/2011   Low blood potassium    Non-insulin dependent type 2 diabetes mellitus (HCC)    Osteoporosis    Shortness of breath    2D Echocardiogram 01/26/2009   EF of greater than 55%, mild MR, mild TR, normal ventricular function   Thickened endometrium 10/26/2017   Noted by gyne in 2017, missed 6 month follow up, referred in 09/2017   Ventricular tachycardia, non-sustained (HCC)    developed during stress test 02/08/2006, spontaneously aborted, mild reversible apical defect    Past Surgical History:  Procedure Laterality Date   BREAST LUMPECTOMY Left 2009   Left breast 2009   CATARACT EXTRACTION W/PHACO Left 10/28/2014   Procedure: PHACO EMULSION CATARACT EXTRACTION WITH INTRAOCULAR LENS IMPLANT LEFT EYE (IOC);  Surgeon: Chalmers Guest, MD;  Location: [facility] OR;  Service: Ophthalmology;  Laterality: Left;   COLONOSCOPY     cyst removed from left foot     REFRACTIVE SURGERY Left     Home Medications:  (Not in a hospital admission)   Allergies:  Allergies   Allergen Reactions   Amlodipine Swelling   Benadryl [Diphenhydramine Hcl] Hypertension   Citalopram Other (See Comments)    unknown   Farxiga [Dapagliflozin] Other (See Comments)    DKA   Metformin And Related Diarrhea    Lost appetite and weight    Tramadol Other (See Comments)    Felt light headed and dizzy   Crestor [Rosuvastatin] Other (See Comments)    "Feet swelling", makes her feel weak    Family History  Problem Relation Age of Onset   Hypertension Mother    Hyperlipidemia Mother    Stroke Mother    Urticaria Mother    Cancer Father        pancreatic   Colon cancer Father    Heart disease Brother 40       bypass   Heart disease Brother 57       bypass   Arthritis Other    Asthma Other    Diabetes Other    Colon cancer Paternal Aunt    Esophageal cancer Neg Hx    Stomach cancer Neg Hx    Rectal cancer Neg Hx     Social History:  reports that she has never smoked. She has never been exposed to tobacco smoke. She has never used smokeless tobacco. She reports that she does not  drink alcohol and does not use drugs.  ROS: A complete review of systems was performed.  All systems are negative except for pertinent findings as noted.  Physical Exam:  Vital signs in last 24 hours: @VSRANGES @ General:  Alert and oriented, No acute distress HEENT: Normocephalic, atraumatic Neck: No JVD or lymphadenopathy Cardiovascular: Regular rate  Lungs: Normal inspiratory/expiratory excursion Abdomen: Soft, nontender, nondistended, no abdominal masses Back: No CVA tenderness Extremities: No edema Neurologic: Grossly intact  I have reviewed prior pt notes  I have reviewed notes from referring/previous physicians  I have reviewed urinalysis results  I have independently reviewed prior imaging  I have reviewed prior urine culture   Impression/Assessment:  ***  Plan:  ***  Bertram Millard  05/01/2023, 3:13 PM  Bertram Millard.  MD

## 2023-05-01 NOTE — Patient Instructions (Addendum)
STOP FARXIGA  Take Toujeo/Tresiba  5 units ONCE daily  Humalog 5 units with each meal    HOW TO TREAT LOW BLOOD SUGARS (Blood sugar LESS THAN 70 MG/DL) Please follow the RULE OF 15 for the treatment of hypoglycemia treatment (when your (blood sugars are less than 70 mg/dL)   STEP 1: Take 15 grams of carbohydrates when your blood sugar is low, which includes:  3-4 GLUCOSE TABS  OR 3-4 OZ OF JUICE OR REGULAR SODA OR ONE TUBE OF GLUCOSE GEL    STEP 2: RECHECK blood sugar in 15 MINUTES STEP 3: If your blood sugar is still low at the 15 minute recheck --> then, go back to STEP 1 and treat AGAIN with another 15 grams of carbohydrates.

## 2023-05-01 NOTE — Assessment & Plan Note (Signed)
 Hyperlipidemia:Low fat diet discussed and encouraged.   Lipid Panel  Lab Results  Component Value Date   CHOL 192 11/24/2021   HDL 118 11/24/2021   LDLCALC 65 11/24/2021   TRIG 43 11/24/2021   CHOLHDL 1.6 11/24/2021     Updated lab needed at/ before next visit.

## 2023-05-01 NOTE — Progress Notes (Signed)
Name: Emily Jennings  MRN/ DOB: [id number], 02/01/1934   Age/ Sex: 89 y.o., female    PCP: [name], MD   Reason for Endocrinology Evaluation: Type 2 Diabetes Mellitus     Date of Initial Endocrinology Visit: 12/25/2022    PATIENT IDENTIFIER: Emily Jennings is a 89 y.o. female with a past medical history of DM, anxiety, Hx breast CA. The patient presented for initial endocrinology clinic visit on 12/25/2022 for consultative assistance with her diabetes management.    HPI: [name] was    Diagnosed with DM at age 66 Prior Medications tried/Intolerance: Metformin-weight loss            Hemoglobin A1c has ranged from 6.8% in 2022, peaking at 9.2% in 2016.   Transferred care from Dr. Isidoro Donning office   Pt with fear of hypoglycemia  She was started on Farxiga by her nephrologist 2024   Switched Humalog Mix to basal insulin 01/2023 due to recurrent hypoglycemia   Stop Marcelline Deist 04/2023 due to DKA  SUBJECTIVE:   During the last visit (02/16/2023): A1c 6.9%  Today (05/01/23): [name] is here for follow-up on diabetes management. She  checks her blood sugars multiple times daily. The patient has  had hypoglycemic episodes since the last clinic visit, pt is symptomatic with these episodes.   She was admitted in May 2024 with DKA  She is on farxiga through nephrology    She has occasional nausea but no vomiting  Has occaional diarrhea with constipaiton   She denies any headaches    HOME DIABETES REGIMEN: Toujeo 5-6  units daily CF : Humalog (BG-130/45) - not taking       Statin: yes ACE-I/ARB: no    CONTINUOUS GLUCOSE MONITORING RECORD INTERPRETATION    Dates of Recording:7/31-8/13/2024  Sensor description:dexcom  Results statistics:   CGM use % of time 93  Average and SD 178/51  Time in range   54%  % Time Above 180 37  % Time above 250 8  % Time Below target <1   Glycemic patterns summary: Bg's trend down at night and increase  during the day   Hyperglycemic episodes  postprandial   Hypoglycemic episodes occurred after a bolus   Overnight periods: variable     DIABETIC COMPLICATIONS: Microvascular complications:  CKD IV Denies:  Last eye exam: Completed 2023  Macrovascular complications:   Denies: CAD, PVD, CVA   PAST HISTORY: Past Medical History:  Past Medical History:  Diagnosis Date   Allergy    Anxiety    ANXIETY DISORDER, GENERALIZED 07/15/2007   Qualifier: Diagnosis of  By: Chipper Herb     Arthritis    Cancer [facility]) 2009   breast, carcinoma in situ left   Carcinoma in situ of breast 05/21/2008   Qualifier: Diagnosis of  By: [name] MD, Margaret  Diagnosed in 2009, completed 5 year course of tamoxifen, no evidence of recurrence    Carotid stenosis    11/16/2005  mild plaque formation and stenosis proximal right ECA   Cataract    Complication of anesthesia    Coronary artery disease    cardiac catheterization on 03/20/2006  LAD mid 40% stenosis, left circumflex mild 40% stenosis, RCA mid-vessel 40% to 50% lesion   EF 60%   Diabetes mellitus    GERD (gastroesophageal reflux disease)    Hernia, inguinal    left   Hyperglycemia    Hypertension    Insomnia 11/16/2011   Low blood potassium  Non-insulin dependent type 2 diabetes mellitus (HCC)    Osteoporosis    Shortness of breath    2D Echocardiogram 01/26/2009   EF of greater than 55%, mild MR, mild TR, normal ventricular function   Thickened endometrium 10/26/2017   Noted by gyne in 2017, missed 6 month follow up, referred in 09/2017   Ventricular tachycardia, non-sustained (HCC)    developed during stress test 02/08/2006, spontaneously aborted, mild reversible apical defect   Past Surgical History:  Past Surgical History:  Procedure Laterality Date   BREAST LUMPECTOMY Left 2009   Left breast 2009   CATARACT EXTRACTION W/PHACO Left 10/28/2014   Procedure: PHACO EMULSION CATARACT EXTRACTION WITH INTRAOCULAR LENS IMPLANT LEFT  EYE (IOC);  Surgeon: Chalmers Guest, MD;  Location: [facility] OR;  Service: Ophthalmology;  Laterality: Left;   COLONOSCOPY     cyst removed from left foot     REFRACTIVE SURGERY Left     Social History:  reports that she has never smoked. She has never been exposed to tobacco smoke. She has never used smokeless tobacco. She reports that she does not drink alcohol and does not use drugs. Family History:  Family History  Problem Relation Age of Onset   Hypertension Mother    Hyperlipidemia Mother    Stroke Mother    Urticaria Mother    Cancer Father        pancreatic   Colon cancer Father    Heart disease Brother 40       bypass   Heart disease Brother 61       bypass   Arthritis Other    Asthma Other    Diabetes Other    Colon cancer Paternal Aunt    Esophageal cancer Neg Hx    Stomach cancer Neg Hx    Rectal cancer Neg Hx      HOME MEDICATIONS: Allergies as of 05/01/2023       Reactions   Amlodipine Swelling   Benadryl [diphenhydramine Hcl] Hypertension   Citalopram Other (See Comments)   unknown   Farxiga [dapagliflozin] Other (See Comments)   DKA   Metformin And Related Diarrhea   Lost appetite and weight    Tramadol Other (See Comments)   Felt light headed and dizzy   Crestor [rosuvastatin] Other (See Comments)   "Feet swelling", makes her feel weak        Medication List        Accurate as of May 01, 2023  2:19 PM. If you have any questions, ask your nurse or doctor.          STOP taking these medications    Farxiga 10 MG Tabs tablet Generic drug: dapagliflozin propanediol Stopped by: Johnney Ou        TAKE these medications    alendronate 70 MG tablet Commonly known as: FOSAMAX TAKE 1 TABLET BY MOUTH EVERY 7 DAYS. TAKE WITH A FULL GLASS OF WATER ON AN EMPTY STOMACH   aspirin EC 81 MG tablet Take 1 tablet (81 mg total) by mouth daily with breakfast.   blood glucose meter kit and supplies Kit One touch Ultra. Use three times daily  as directed. (FOR ICD E11.65)   calcitRIOL 0.25 MCG capsule Commonly known as: ROCALTROL Take 0.25 mcg by mouth 3 (three) times a week.   carvedilol 12.5 MG tablet Commonly known as: COREG Take two tablets by mouth two times daily   Dexcom G7 Sensor Misc 1 Device by Does not apply route as directed.  furosemide 40 MG tablet Commonly known as: LASIX Take 1 tablet (40 mg total) by mouth 2 (two) times daily.   hydrALAZINE 25 MG tablet Commonly known as: APRESOLINE Take one tablet by mouth two times daily What changed: Another medication with the same name was removed. Continue taking this medication, and follow the directions you see here. Changed by: Johnney Ou    insulin lispro 100 UNIT/ML KwikPen Commonly known as: HumaLOG KwikPen Max daily 30 units   Insulin Pen Needle 32G X 4 MM Misc 1 each by Does not apply route in the morning and at bedtime.   mupirocin ointment 2 % Commonly known as: BACTROBAN Apply 1 Application topically 2 (two) times daily.   OneTouch Delica Lancets 33G Misc 1 each by Does not apply route 3 (three) times daily. Use to check glucose three times daily   OneTouch Ultra test strip Generic drug: glucose blood USE TO TEST 4 TIMES A DAY   Potassium Chloride ER 20 MEQ Tbcr TAKE 1 TABLET DAILY BY MOUTH TAKE WHILE TAKING LASIX/FUROSEMIDE   rosuvastatin 5 MG tablet Commonly known as: CRESTOR TAKE 1 TABLET (5 MG TOTAL) BY MOUTH DAILY.   spironolactone 25 MG tablet Commonly known as: ALDACTONE Take 1 tablet (25 mg total) by mouth daily.   Toujeo SoloStar 300 UNIT/ML Solostar Pen Generic drug: insulin glargine (1 Unit Dial) Inject 15 Units into the skin daily. What changed: how much to take         ALLERGIES: Allergies  Allergen Reactions   Amlodipine Swelling   Benadryl [Diphenhydramine Hcl] Hypertension   Citalopram Other (See Comments)    unknown   Farxiga [Dapagliflozin] Other (See Comments)    DKA   Metformin And  Related Diarrhea    Lost appetite and weight    Tramadol Other (See Comments)    Felt light headed and dizzy   Crestor [Rosuvastatin] Other (See Comments)    "Feet swelling", makes her feel weak     REVIEW OF SYSTEMS: A comprehensive ROS was conducted with the patient and is negative except as per HPI    OBJECTIVE:   VITAL SIGNS: BP (!) 164/80 (BP Location: Left Arm, Patient Position: Sitting, Cuff Size: Small)   Pulse (!) 56   Ht 5' (1.524 m)   Wt 116 lb (52.6 kg)   SpO2 96%   BMI 22.65 kg/m    PHYSICAL EXAM:  General: Pt appears well and is in NAD  Lungs: Clear with good BS bilat   Heart: RRR   Extremities:  Lower extremities - Trace  pretibial edema.   Neuro: MS is good with appropriate affect, pt is alert and Ox3    DM Foot Exam 02/16/2023  The skin of the feet is intact without sores or ulcerations. The pedal pulses are 2+ on right and 2+ on left. The sensation is decreased  to a screening 5.07, 10 gram monofilament bilaterally   DATA REVIEWED:  Lab Results  Component Value Date   HGBA1C 6.9 (A) 12/25/2022   HGBA1C 7.4 (A) 07/06/2022   HGBA1C 7.2 (H) 03/22/2022    ASSESSMENT / PLAN / RECOMMENDATIONS:   1) Type 2 Diabetes Mellitus, Optimally controlled, With neuropathic and CKD IV complications - Most recent A1c of 7.5 %. Goal A1c < 7.5 %.     -Her A1c is optimal without hypoglycemia or severe hyperglycemia - Given advance age , BG goal is 90-200 mg/dL  - She was started on Farxiga through nephrology , we will discontinue due  to history of hospitalization for DKA -I attempted to prescribe correction scale for her but she has not been able to use it, she is more comfortable using a standard dose of prandial insulin with each meal -She would like to change Toujeo at this causing her $105 per pen, I will switch to [place] -Patient advised not to increase basal insulin due to hypoglycemia as that increases the risk of hypoglycemia  overnight  MEDICATIONS:  Decrease Toujeo/Tresiba 5 units daily Humalog 5 units with each meal Stop Farxiga  EDUCATION / INSTRUCTIONS: BG monitoring instructions: Patient is instructed to check her blood sugars 3 times a day. Call [facility] if: BG persistently < 70  I reviewed the Rule of 15 for the treatment of hypoglycemia in detail with the patient. Literature supplied.   2) Diabetic complications:  Eye: Does not have known diabetic retinopathy.  Neuro/ Feet: Does  have known diabetic peripheral neuropathy. Renal: Patient does  have known baseline CKD. She is not on an ACEI/ARB at present.  3) Resistant HTN:   -Asymptomatic at this time -She was recently evaluated by her PCP and was started on hydralazine -She also has carvedilol, furosemide, and spironolactone on her medication list -I will check Aldo/renin  Follow-up in 4 months    Signed electronically by: Lyndle Herrlich, MD  [facility]  [facility] [address] [place] [place], [place] [address] Phone: [phone] FAX: [phone]   CC: [name], MD [address] [place] [address] Phone: [phone]  Fax: [phone]    Return to Endocrinology clinic as below: Future Appointments  Date Time Provider Department Center  05/02/2023 10:00 AM Marcine Matar, MD [facility] None  05/30/2023 10:00 AM Clinton Gallant, RN [facility] None  06/13/2023  2:00 PM Mare Loan, RD NDM-NDMR None  06/26/2023  1:20 PM [name], MD [facility]  08/14/2023 12:10 PM , Konrad Dolores, MD [facility] None  12/03/2023  9:30 AM RPC-ANNUAL WELLNESS VISIT R[facility]RPC RPC

## 2023-05-01 NOTE — Progress Notes (Signed)
   Emily Jennings     MRN: [id number]      DOB: 19-Jul-1934  Chief Complaint  Patient presents with   Follow-up    Follow up bp eval, requesting mupirocin ointment    HPI Emily Jennings is here for follow up and re-evaluation of chronic medical conditions, medication management and review of any available recent lab and radiology data.  Preventive health is updated, specifically  Cancer screening and Immunization.   Questions or concerns regarding consultations or procedures which the PT has had in the interim are  addressed. The PT denies any adverse reactions to current medications since the last visit.  Still battling uncontrolled diabetes with highs and lows Still battling uncontrolled HTNs   ROS Denies recent fever or chills. Denies sinus pressure, nasal congestion, ear pain or sore throat. Denies chest congestion, productive cough or wheezing. Denies chest pains, palpitations and leg swelling Denies abdominal pain, nausea, vomiting,diarrhea or constipation.   Denies dysuria, frequency, hesitancy or incontinence. Chronic  joint pain, s and limitation in mobility. Denies headaches, seizures, numbness, or tingling. Chronic anxiety PE  BP (!) 167/73 (BP Location: Right Arm, Patient Position: Sitting, Cuff Size: Normal)   Pulse 74   Ht 5' (1.524 m)   Wt 108 lb 1.9 oz (49 kg)   SpO2 93%   BMI 21.12 kg/m   Patient alert and oriented  HEENT: No facial asymmetry, EOMI,     Neck decreased ROMChest: Clear to auscultation bilaterally.  CVS: S1, S2  murmurs, no S3.Regular rate.  ABD: Soft non tender.   Ext: No edema  MS: Adequate though educed  ROM spine, shoulders, hips and knees.  Skin: Intact, no ulcerations or rash noted.  Psych: Good eye contact, normal affect. Memory intact not anxious or depressed appearing.  CNS: CN 2-12 intact, power,  normal throughout.no focal deficits noted.   Assessment & Plan  Resistant hypertension Uncontrolled increase dose of hydralazine and  continue coreg tale every 12 hra and spironolactone DASH diet and commitment to daily physical activity for a minimum of 30 minutes discussed and encouraged, as a part of hypertension management. The importance of attaining a healthy weight is also discussed.     04/24/2023    1:21 PM 04/24/2023    1:16 PM 04/14/2023    6:30 AM 04/14/2023    6:00 AM 04/14/2023    5:13 AM 04/14/2023    4:45 AM 04/14/2023    4:36 AM  BP/Weight  Systolic BP 167 165 174 179 177 197   Diastolic BP 73 70 84 75 85 99   Wt. (Lbs)  108.12     110.23  BMI  21.12 kg/m2     21.53 kg/m2     F/I in 8 weeks  Type 2 diabetes mellitus with stage 3a chronic kidney disease, with long-term current use of insulin (HCC) Reports marked fluctuations in blood sugar and statesafter her next visit with current Endo she willdecide whereshe wants ot be seen.Of note , referral has alreafy been placed to Dr Fransico Him at her request  Hyperlipidemia LDL goal <100 Hyperlipidemia:Low fat diet discussed and encouraged.   Lipid Panel  Lab Results  Component Value Date   CHOL 192 11/24/2021   HDL 118 11/24/2021   LDLCALC 65 11/24/2021   TRIG 43 11/24/2021   CHOLHDL 1.6 11/24/2021     Updated lab needed at/ before next visit.

## 2023-05-01 NOTE — Assessment & Plan Note (Signed)
Uncontrolled increase dose of hydralazine and continue coreg tale every 12 hra and spironolactone DASH diet and commitment to daily physical activity for a minimum of 30 minutes discussed and encouraged, as a part of hypertension management. The importance of attaining a healthy weight is also discussed.     04/24/2023    1:21 PM 04/24/2023    1:16 PM 04/14/2023    6:30 AM 04/14/2023    6:00 AM 04/14/2023    5:13 AM 04/14/2023    4:45 AM 04/14/2023    4:36 AM  BP/Weight  Systolic BP 167 165 174 179 177 197   Diastolic BP 73 70 84 75 85 99   Wt. (Lbs)  108.12     110.23  BMI  21.12 kg/m2     21.53 kg/m2     F/I in 8 weeks

## 2023-05-02 ENCOUNTER — Encounter: Payer: Self-pay | Admitting: Internal Medicine

## 2023-05-02 ENCOUNTER — Ambulatory Visit: Payer: Medicare Other | Admitting: Urology

## 2023-05-02 DIAGNOSIS — N184 Chronic kidney disease, stage 4 (severe): Secondary | ICD-10-CM | POA: Diagnosis not present

## 2023-05-02 DIAGNOSIS — R808 Other proteinuria: Secondary | ICD-10-CM | POA: Diagnosis not present

## 2023-05-02 DIAGNOSIS — N189 Chronic kidney disease, unspecified: Secondary | ICD-10-CM | POA: Diagnosis not present

## 2023-05-02 DIAGNOSIS — N1330 Unspecified hydronephrosis: Secondary | ICD-10-CM

## 2023-05-02 DIAGNOSIS — E1122 Type 2 diabetes mellitus with diabetic chronic kidney disease: Secondary | ICD-10-CM | POA: Diagnosis not present

## 2023-05-02 DIAGNOSIS — N2581 Secondary hyperparathyroidism of renal origin: Secondary | ICD-10-CM | POA: Diagnosis not present

## 2023-05-03 DIAGNOSIS — E1142 Type 2 diabetes mellitus with diabetic polyneuropathy: Secondary | ICD-10-CM | POA: Diagnosis not present

## 2023-05-09 ENCOUNTER — Telehealth: Payer: Self-pay | Admitting: Family Medicine

## 2023-05-09 NOTE — Telephone Encounter (Signed)
Patient called in BP is running high in the 200's   This morning when checked it was reading 216/104. Patient wants a call back in regard

## 2023-05-09 NOTE — Telephone Encounter (Signed)
LMTRC-KG 

## 2023-05-10 NOTE — Telephone Encounter (Signed)
Pt  returning call in regard to high BP

## 2023-05-11 NOTE — Telephone Encounter (Signed)
Patient blood pressure 223/101 patient advised to go to ER

## 2023-05-13 ENCOUNTER — Telehealth: Payer: Self-pay | Admitting: Internal Medicine

## 2023-05-13 DIAGNOSIS — R0609 Other forms of dyspnea: Secondary | ICD-10-CM

## 2023-05-13 MED ORDER — POTASSIUM CHLORIDE ER 20 MEQ PO TBCR: 10.0000 meq | EXTENDED_RELEASE_TABLET | Freq: Every day | ORAL | Status: DC

## 2023-05-13 MED ORDER — SPIRONOLACTONE 50 MG PO TABS: 50.0000 mg | ORAL_TABLET | Freq: Every day | ORAL | 3 refills | Status: DC

## 2023-05-13 NOTE — Telephone Encounter (Signed)
Please let the pt know that based on her last labs , her blood pressure maybe high due to hormonal issue.   She needs to be on Spironolactone to help with the hormone issues.    She needs to take 50 mg  one tablet daily and take HALF of potassium daily with furosemide    She was on spironolactone, but on her last visit , she told me she stopped one of them but was not sure which one. She MUST stay on Spironolactone    Thank you

## 2023-05-13 NOTE — Telephone Encounter (Signed)
error 

## 2023-05-15 NOTE — Telephone Encounter (Signed)
LVM to return call.

## 2023-05-16 ENCOUNTER — Inpatient Hospital Stay (HOSPITAL_COMMUNITY)
Admission: EM | Admit: 2023-05-16 | Discharge: 2023-05-28 | DRG: 064 | Disposition: A | Discharge status: Home Health | Payer: Medicare Other | Attending: Family Medicine | Admitting: Family Medicine

## 2023-05-16 ENCOUNTER — Emergency Department (HOSPITAL_COMMUNITY): Payer: Medicare Other

## 2023-05-16 ENCOUNTER — Other Ambulatory Visit: Payer: Self-pay

## 2023-05-16 ENCOUNTER — Encounter: Payer: Self-pay | Admitting: Family Medicine

## 2023-05-16 ENCOUNTER — Ambulatory Visit (INDEPENDENT_AMBULATORY_CARE_PROVIDER_SITE_OTHER): Payer: Medicare Other | Admitting: Family Medicine

## 2023-05-16 ENCOUNTER — Encounter (HOSPITAL_COMMUNITY): Payer: Self-pay | Admitting: Neurology

## 2023-05-16 VITALS — BP 138/72 | HR 78 | Ht 60.0 in | Wt 118.0 lb

## 2023-05-16 DIAGNOSIS — I2510 Atherosclerotic heart disease of native coronary artery without angina pectoris: Secondary | ICD-10-CM | POA: Diagnosis not present

## 2023-05-16 DIAGNOSIS — I5033 Acute on chronic diastolic (congestive) heart failure: Secondary | ICD-10-CM | POA: Diagnosis present

## 2023-05-16 DIAGNOSIS — Z7189 Other specified counseling: Secondary | ICD-10-CM

## 2023-05-16 DIAGNOSIS — N1831 Chronic kidney disease, stage 3a: Secondary | ICD-10-CM | POA: Diagnosis not present

## 2023-05-16 DIAGNOSIS — I10 Essential (primary) hypertension: Secondary | ICD-10-CM | POA: Diagnosis not present

## 2023-05-16 DIAGNOSIS — E1165 Type 2 diabetes mellitus with hyperglycemia: Secondary | ICD-10-CM | POA: Diagnosis not present

## 2023-05-16 DIAGNOSIS — I160 Hypertensive urgency: Secondary | ICD-10-CM | POA: Diagnosis not present

## 2023-05-16 DIAGNOSIS — Z794 Long term (current) use of insulin: Secondary | ICD-10-CM | POA: Diagnosis not present

## 2023-05-16 DIAGNOSIS — R531 Weakness: Secondary | ICD-10-CM | POA: Diagnosis not present

## 2023-05-16 DIAGNOSIS — M810 Age-related osteoporosis without current pathological fracture: Secondary | ICD-10-CM | POA: Diagnosis present

## 2023-05-16 DIAGNOSIS — I5032 Chronic diastolic (congestive) heart failure: Secondary | ICD-10-CM | POA: Diagnosis not present

## 2023-05-16 DIAGNOSIS — E11649 Type 2 diabetes mellitus with hypoglycemia without coma: Secondary | ICD-10-CM | POA: Diagnosis not present

## 2023-05-16 DIAGNOSIS — J383 Other diseases of vocal cords: Secondary | ICD-10-CM | POA: Diagnosis not present

## 2023-05-16 DIAGNOSIS — D649 Anemia, unspecified: Secondary | ICD-10-CM | POA: Diagnosis not present

## 2023-05-16 DIAGNOSIS — G936 Cerebral edema: Secondary | ICD-10-CM | POA: Diagnosis present

## 2023-05-16 DIAGNOSIS — G9341 Metabolic encephalopathy: Secondary | ICD-10-CM | POA: Diagnosis not present

## 2023-05-16 DIAGNOSIS — E785 Hyperlipidemia, unspecified: Secondary | ICD-10-CM | POA: Diagnosis not present

## 2023-05-16 DIAGNOSIS — I619 Nontraumatic intracerebral hemorrhage, unspecified: Principal | ICD-10-CM | POA: Diagnosis present

## 2023-05-16 DIAGNOSIS — N1832 Chronic kidney disease, stage 3b: Secondary | ICD-10-CM | POA: Diagnosis present

## 2023-05-16 DIAGNOSIS — N200 Calculus of kidney: Secondary | ICD-10-CM | POA: Diagnosis not present

## 2023-05-16 DIAGNOSIS — I700 Atherosclerosis of aorta: Secondary | ICD-10-CM | POA: Diagnosis not present

## 2023-05-16 DIAGNOSIS — R5381 Other malaise: Secondary | ICD-10-CM | POA: Diagnosis present

## 2023-05-16 DIAGNOSIS — R4701 Aphasia: Secondary | ICD-10-CM | POA: Diagnosis present

## 2023-05-16 DIAGNOSIS — Z833 Family history of diabetes mellitus: Secondary | ICD-10-CM

## 2023-05-16 DIAGNOSIS — Z8249 Family history of ischemic heart disease and other diseases of the circulatory system: Secondary | ICD-10-CM | POA: Diagnosis not present

## 2023-05-16 DIAGNOSIS — E041 Nontoxic single thyroid nodule: Secondary | ICD-10-CM | POA: Diagnosis present

## 2023-05-16 DIAGNOSIS — I161 Hypertensive emergency: Secondary | ICD-10-CM | POA: Diagnosis present

## 2023-05-16 DIAGNOSIS — Z79899 Other long term (current) drug therapy: Secondary | ICD-10-CM | POA: Diagnosis not present

## 2023-05-16 DIAGNOSIS — R061 Stridor: Secondary | ICD-10-CM | POA: Diagnosis not present

## 2023-05-16 DIAGNOSIS — Z823 Family history of stroke: Secondary | ICD-10-CM

## 2023-05-16 DIAGNOSIS — I129 Hypertensive chronic kidney disease with stage 1 through stage 4 chronic kidney disease, or unspecified chronic kidney disease: Secondary | ICD-10-CM | POA: Diagnosis not present

## 2023-05-16 DIAGNOSIS — I130 Hypertensive heart and chronic kidney disease with heart failure and stage 1 through stage 4 chronic kidney disease, or unspecified chronic kidney disease: Secondary | ICD-10-CM | POA: Diagnosis present

## 2023-05-16 DIAGNOSIS — J811 Chronic pulmonary edema: Secondary | ICD-10-CM | POA: Diagnosis not present

## 2023-05-16 DIAGNOSIS — N179 Acute kidney failure, unspecified: Secondary | ICD-10-CM | POA: Diagnosis present

## 2023-05-16 DIAGNOSIS — R159 Full incontinence of feces: Secondary | ICD-10-CM | POA: Diagnosis not present

## 2023-05-16 DIAGNOSIS — Z83438 Family history of other disorder of lipoprotein metabolism and other lipidemia: Secondary | ICD-10-CM

## 2023-05-16 DIAGNOSIS — E876 Hypokalemia: Secondary | ICD-10-CM | POA: Diagnosis not present

## 2023-05-16 DIAGNOSIS — N2889 Other specified disorders of kidney and ureter: Secondary | ICD-10-CM | POA: Diagnosis not present

## 2023-05-16 DIAGNOSIS — I614 Nontraumatic intracerebral hemorrhage in cerebellum: Secondary | ICD-10-CM | POA: Diagnosis not present

## 2023-05-16 DIAGNOSIS — I1A0 Resistant hypertension: Secondary | ICD-10-CM | POA: Diagnosis present

## 2023-05-16 DIAGNOSIS — I672 Cerebral atherosclerosis: Secondary | ICD-10-CM | POA: Diagnosis not present

## 2023-05-16 DIAGNOSIS — I3139 Other pericardial effusion (noninflammatory): Secondary | ICD-10-CM | POA: Diagnosis present

## 2023-05-16 DIAGNOSIS — E782 Mixed hyperlipidemia: Secondary | ICD-10-CM | POA: Insufficient documentation

## 2023-05-16 DIAGNOSIS — E871 Hypo-osmolality and hyponatremia: Secondary | ICD-10-CM | POA: Diagnosis present

## 2023-05-16 DIAGNOSIS — G935 Compression of brain: Secondary | ICD-10-CM | POA: Diagnosis present

## 2023-05-16 DIAGNOSIS — I517 Cardiomegaly: Secondary | ICD-10-CM | POA: Diagnosis not present

## 2023-05-16 DIAGNOSIS — I6523 Occlusion and stenosis of bilateral carotid arteries: Secondary | ICD-10-CM | POA: Diagnosis not present

## 2023-05-16 DIAGNOSIS — I6782 Cerebral ischemia: Secondary | ICD-10-CM | POA: Diagnosis not present

## 2023-05-16 DIAGNOSIS — Z8261 Family history of arthritis: Secondary | ICD-10-CM

## 2023-05-16 DIAGNOSIS — E1122 Type 2 diabetes mellitus with diabetic chronic kidney disease: Secondary | ICD-10-CM | POA: Diagnosis present

## 2023-05-16 DIAGNOSIS — Z888 Allergy status to other drugs, medicaments and biological substances status: Secondary | ICD-10-CM

## 2023-05-16 DIAGNOSIS — E8779 Other fluid overload: Secondary | ICD-10-CM | POA: Diagnosis not present

## 2023-05-16 DIAGNOSIS — F411 Generalized anxiety disorder: Secondary | ICD-10-CM | POA: Diagnosis present

## 2023-05-16 DIAGNOSIS — N261 Atrophy of kidney (terminal): Secondary | ICD-10-CM | POA: Diagnosis not present

## 2023-05-16 DIAGNOSIS — Z853 Personal history of malignant neoplasm of breast: Secondary | ICD-10-CM

## 2023-05-16 DIAGNOSIS — Z66 Do not resuscitate: Secondary | ICD-10-CM | POA: Diagnosis not present

## 2023-05-16 DIAGNOSIS — R0602 Shortness of breath: Secondary | ICD-10-CM | POA: Diagnosis not present

## 2023-05-16 DIAGNOSIS — T508X5A Adverse effect of diagnostic agents, initial encounter: Secondary | ICD-10-CM | POA: Diagnosis present

## 2023-05-16 DIAGNOSIS — N1411 Contrast-induced nephropathy: Secondary | ICD-10-CM | POA: Diagnosis present

## 2023-05-16 DIAGNOSIS — I6389 Other cerebral infarction: Secondary | ICD-10-CM | POA: Diagnosis not present

## 2023-05-16 DIAGNOSIS — D631 Anemia in chronic kidney disease: Secondary | ICD-10-CM | POA: Diagnosis not present

## 2023-05-16 DIAGNOSIS — N281 Cyst of kidney, acquired: Secondary | ICD-10-CM | POA: Diagnosis not present

## 2023-05-16 DIAGNOSIS — Z825 Family history of asthma and other chronic lower respiratory diseases: Secondary | ICD-10-CM

## 2023-05-16 DIAGNOSIS — N183 Chronic kidney disease, stage 3 unspecified: Secondary | ICD-10-CM | POA: Diagnosis not present

## 2023-05-16 DIAGNOSIS — Z800 Family history of malignant neoplasm of digestive organs: Secondary | ICD-10-CM

## 2023-05-16 DIAGNOSIS — T502X5A Adverse effect of carbonic-anhydrase inhibitors, benzothiadiazides and other diuretics, initial encounter: Secondary | ICD-10-CM | POA: Diagnosis present

## 2023-05-16 DIAGNOSIS — I629 Nontraumatic intracranial hemorrhage, unspecified: Secondary | ICD-10-CM | POA: Diagnosis not present

## 2023-05-16 DIAGNOSIS — K219 Gastro-esophageal reflux disease without esophagitis: Secondary | ICD-10-CM | POA: Diagnosis present

## 2023-05-16 DIAGNOSIS — Z8673 Personal history of transient ischemic attack (TIA), and cerebral infarction without residual deficits: Secondary | ICD-10-CM

## 2023-05-16 DIAGNOSIS — R9089 Other abnormal findings on diagnostic imaging of central nervous system: Secondary | ICD-10-CM | POA: Diagnosis not present

## 2023-05-16 LAB — COMPREHENSIVE METABOLIC PANEL
ALT: 15 U/L (ref 0–44)
AST: 22 U/L (ref 15–41)
Albumin: 2.6 g/dL — ABNORMAL LOW (ref 3.5–5.0)
Alkaline Phosphatase: 64 U/L (ref 38–126)
Anion gap: 9 (ref 5–15)
BUN: 31 mg/dL — ABNORMAL HIGH (ref 8–23)
CO2: 27 mmol/L (ref 22–32)
Calcium: 8.1 mg/dL — ABNORMAL LOW (ref 8.9–10.3)
Chloride: 97 mmol/L — ABNORMAL LOW (ref 98–111)
Creatinine, Ser: 1.65 mg/dL — ABNORMAL HIGH (ref 0.44–1.00)
GFR, Estimated: 30 mL/min — ABNORMAL LOW (ref >60)
Glucose, Bld: 188 mg/dL — ABNORMAL HIGH (ref 70–99)
Potassium: 3.3 mmol/L — ABNORMAL LOW (ref 3.5–5.1)
Sodium: 133 mmol/L — ABNORMAL LOW (ref 135–145)
Total Bilirubin: 0.9 mg/dL (ref 0.3–1.2)
Total Protein: 4.5 g/dL — ABNORMAL LOW (ref 6.5–8.1)

## 2023-05-16 LAB — URINALYSIS, W/ REFLEX TO CULTURE (INFECTION SUSPECTED)
Bilirubin Urine: NEGATIVE
Glucose, UA: 150 mg/dL — AB
Hgb urine dipstick: NEGATIVE
Ketones, ur: 5 mg/dL — AB
Leukocytes,Ua: NEGATIVE
Nitrite: NEGATIVE
Protein, ur: >=300 mg/dL — AB
Specific Gravity, Urine: 1.007 (ref 1.005–1.030)
pH: 7 (ref 5.0–8.0)

## 2023-05-16 LAB — CBC WITH DIFFERENTIAL/PLATELET
Abs Immature Granulocytes: 0.01 10*3/uL (ref 0.00–0.07)
Basophils Absolute: 0 10*3/uL (ref 0.0–0.1)
Basophils Relative: 1 %
Eosinophils Absolute: 0.1 10*3/uL (ref 0.0–0.5)
Eosinophils Relative: 2 %
HCT: 33.5 % — ABNORMAL LOW (ref 36.0–46.0)
Hemoglobin: 10.8 g/dL — ABNORMAL LOW (ref 12.0–15.0)
Immature Granulocytes: 0 %
Lymphocytes Relative: 17 %
Lymphs Abs: 0.9 10*3/uL (ref 0.7–4.0)
MCH: 30.7 pg (ref 26.0–34.0)
MCHC: 32.2 g/dL (ref 30.0–36.0)
MCV: 95.2 fL (ref 80.0–100.0)
Monocytes Absolute: 0.6 10*3/uL (ref 0.1–1.0)
Monocytes Relative: 11 %
Neutro Abs: 3.8 10*3/uL (ref 1.7–7.7)
Neutrophils Relative %: 69 %
Platelets: 170 10*3/uL (ref 150–400)
RBC: 3.52 MIL/uL — ABNORMAL LOW (ref 3.87–5.11)
RDW: 14.2 % (ref 11.5–15.5)
WBC: 5.5 10*3/uL (ref 4.0–10.5)
nRBC: 0 % (ref 0.0–0.2)

## 2023-05-16 LAB — SODIUM: Sodium: 134 mmol/L — ABNORMAL LOW (ref 135–145)

## 2023-05-16 LAB — MRSA NEXT GEN BY PCR, NASAL: MRSA by PCR Next Gen: NOT DETECTED

## 2023-05-16 MED ORDER — STROKE: EARLY STAGES OF RECOVERY BOOK
Freq: Once | Status: AC
  Filled 2023-05-16: qty 1

## 2023-05-16 MED ORDER — ACETAMINOPHEN 160 MG/5ML PO SOLN: 650.0000 mg | ORAL | Status: DC | PRN

## 2023-05-16 MED ORDER — PANTOPRAZOLE SODIUM 40 MG IV SOLR: 40.0000 mg | Freq: Every day | INTRAVENOUS | Status: DC

## 2023-05-16 MED ORDER — ACETAMINOPHEN 325 MG PO TABS
650.0000 mg | ORAL_TABLET | ORAL | Status: DC | PRN
  Administered 2023-05-18: 650 mg via ORAL
  Filled 2023-05-16: qty 2

## 2023-05-16 MED ORDER — ACETAMINOPHEN 650 MG RE SUPP: 650.0000 mg | RECTAL | Status: DC | PRN

## 2023-05-16 MED ORDER — SODIUM CHLORIDE 3 % IV SOLN
INTRAVENOUS | Status: DC
  Filled 2023-05-16 (×7): qty 500

## 2023-05-16 MED ORDER — SENNOSIDES-DOCUSATE SODIUM 8.6-50 MG PO TABS
1.0000 | ORAL_TABLET | Freq: Two times a day (BID) | ORAL | Status: DC
  Administered 2023-05-17 – 2023-05-28 (×14): 1 via ORAL
  Filled 2023-05-16 (×17): qty 1

## 2023-05-16 MED ORDER — CHLORHEXIDINE GLUCONATE CLOTH 2 % EX PADS
6.0000 | MEDICATED_PAD | Freq: Every day | CUTANEOUS | Status: DC
  Administered 2023-05-17 – 2023-05-27 (×12): 6 via TOPICAL

## 2023-05-16 MED ORDER — IOHEXOL 350 MG/ML SOLN
50.0000 mL | Freq: Once | INTRAVENOUS | Status: AC | PRN
  Administered 2023-05-16: 50 mL via INTRAVENOUS

## 2023-05-16 MED ORDER — ORAL CARE MOUTH RINSE: 15.0000 mL | OROMUCOSAL | Status: DC | PRN

## 2023-05-16 MED ORDER — CLEVIDIPINE BUTYRATE 0.5 MG/ML IV EMUL
0.0000 mg/h | INTRAVENOUS | Status: DC
  Administered 2023-05-16: 10 mg/h via INTRAVENOUS
  Administered 2023-05-16: 2 mg/h via INTRAVENOUS
  Administered 2023-05-17: 12 mg/h via INTRAVENOUS
  Administered 2023-05-17: 20 mg/h via INTRAVENOUS
  Administered 2023-05-17: 18 mg/h via INTRAVENOUS
  Administered 2023-05-17: 11 mg/h via INTRAVENOUS
  Administered 2023-05-17: 12 mg/h via INTRAVENOUS
  Administered 2023-05-17: 10 mg/h via INTRAVENOUS
  Administered 2023-05-17: 20 mg/h via INTRAVENOUS
  Administered 2023-05-17: 10 mg/h via INTRAVENOUS
  Administered 2023-05-17: 12 mg/h via INTRAVENOUS
  Administered 2023-05-17: 13 mg/h via INTRAVENOUS
  Administered 2023-05-17: 12 mg/h via INTRAVENOUS
  Administered 2023-05-17: 10 mg/h via INTRAVENOUS
  Administered 2023-05-18: 16 mg/h via INTRAVENOUS
  Administered 2023-05-18: 9.5 mg/h via INTRAVENOUS
  Administered 2023-05-18: 16 mg/h via INTRAVENOUS
  Administered 2023-05-18: 5 mg/h via INTRAVENOUS
  Administered 2023-05-19: 2 mg/h via INTRAVENOUS
  Administered 2023-05-19: 8 mg/h via INTRAVENOUS
  Filled 2023-05-16 (×2): qty 100
  Filled 2023-05-16: qty 50
  Filled 2023-05-16: qty 100
  Filled 2023-05-16 (×7): qty 50
  Filled 2023-05-16: qty 100
  Filled 2023-05-16 (×4): qty 50
  Filled 2023-05-16 (×2): qty 100
  Filled 2023-05-16: qty 50
  Filled 2023-05-16: qty 100

## 2023-05-16 NOTE — Progress Notes (Signed)
Acute Office Visit  Subjective:    Patient ID: Emily Jennings, female    DOB: [birth year], 89 y.o.   MRN: [id number]  Chief Complaint  Patient presents with   Fatigue    Pt reports feeling of fatigue, weakness on right hand/arm, believes she had a stroke last night. Also has slurred speech.    HPI Patient is in today with a  has a history of cerebrovascular accident (CVA) and reported experiencing fatigue, weakness in the right hand and arm, and slurred speech during her clinic visit.   Past Medical History:  Diagnosis Date   Allergy    Anxiety    ANXIETY DISORDER, GENERALIZED 07/15/2007   Qualifier: Diagnosis of  By: Chipper Herb     Arthritis    Cancer [facility]) 2009   breast, carcinoma in situ left   Carcinoma in situ of breast 05/21/2008   Qualifier: Diagnosis of  By: [name] MD, Margaret  Diagnosed in 2009, completed 5 year course of tamoxifen, no evidence of recurrence    Carotid stenosis    11/16/2005  mild plaque formation and stenosis proximal right ECA   Cataract    Complication of anesthesia    Coronary artery disease    cardiac catheterization on 03/20/2006  LAD mid 40% stenosis, left circumflex mild 40% stenosis, RCA mid-vessel 40% to 50% lesion   EF 60%   Diabetes mellitus    GERD (gastroesophageal reflux disease)    Hernia, inguinal    left   Hyperglycemia    Hypertension    Insomnia 11/16/2011   Low blood potassium    Non-insulin dependent type 2 diabetes mellitus (HCC)    Osteoporosis    Shortness of breath    2D Echocardiogram 01/26/2009   EF of greater than 55%, mild MR, mild TR, normal ventricular function   Thickened endometrium 10/26/2017   Noted by gyne in 2017, missed 6 month follow up, referred in 09/2017   Ventricular tachycardia, non-sustained (HCC)    developed during stress test 02/08/2006, spontaneously aborted, mild reversible apical defect    Past Surgical History:  Procedure Laterality Date   BREAST LUMPECTOMY Left 2009   Left breast 2009    CATARACT EXTRACTION W/PHACO Left 10/28/2014   Procedure: PHACO EMULSION CATARACT EXTRACTION WITH INTRAOCULAR LENS IMPLANT LEFT EYE (IOC);  Surgeon: Chalmers Guest, MD;  Location: [facility] OR;  Service: Ophthalmology;  Laterality: Left;   COLONOSCOPY     cyst removed from left foot     REFRACTIVE SURGERY Left     Family History  Problem Relation Age of Onset   Hypertension Mother    Hyperlipidemia Mother    Stroke Mother    Urticaria Mother    Cancer Father        pancreatic   Colon cancer Father    Heart disease Brother 40       bypass   Heart disease Brother 35       bypass   Arthritis Other    Asthma Other    Diabetes Other    Colon cancer Paternal Aunt    Esophageal cancer Neg Hx    Stomach cancer Neg Hx    Rectal cancer Neg Hx     Social History   Socioeconomic History   Marital status: Married    Spouse name: Not on file   Number of children: 2   Years of education: na   Highest education level: Some college, no degree  Occupational History   Occupation: retired  Employer: retired  Tobacco Use   Smoking status: Never    Passive exposure: Never   Smokeless tobacco: Never  Vaping Use   Vaping status: Never Used  Substance and Sexual Activity   Alcohol use: No   Drug use: No   Sexual activity: Not Currently    Birth control/protection: Post-menopausal  Other Topics Concern   Not on file  Social History Narrative   Lives with her husband and son and attends yoga at the [facility] 2 days a week and speaks to her grandson nightly on the phone   Social Determinants of Health   Financial Resource Strain: Low Risk  (12/28/2022)   Overall Financial Resource Strain (CARDIA)    Difficulty of Paying Living Expenses: Not hard at all  Food Insecurity: No Food Insecurity (02/17/2023)   Hunger Vital Sign    Worried About Running Out of Food in the Last Year: Never true    Ran Out of Food in the Last Year: Never true  Transportation Needs: No Transportation Needs (02/17/2023)    PRAPARE - Administrator, Civil Service (Medical): No    Lack of Transportation (Non-Medical): No  Physical Activity: Sufficiently Active (11/15/2022)   Exercise Vital Sign    Days of Exercise per Week: 5 days    Minutes of Exercise per Session: 30 min  Stress: No Stress Concern Present (12/28/2022)   [employer] of Occupational Health - Occupational Stress Questionnaire    Feeling of Stress : Only a little  Social Connections: Moderately Integrated (11/15/2022)   Social Connection and Isolation Panel [NHANES]    Frequency of Communication with Friends and Family: Three times a week    Frequency of Social Gatherings with Friends and Family: Once a week    Attends Religious Services: More than 4 times per year    Active Member of [employer] or Organizations: No    Attends Banker Meetings: Never    Marital Status: Married  Catering manager Violence: Not At Risk (02/17/2023)   Humiliation, Afraid, Rape, and Kick questionnaire    Fear of Current or Ex-Partner: No    Emotionally Abused: No    Physically Abused: No    Sexually Abused: No    Outpatient Medications Prior to Visit  Medication Sig Dispense Refill   alendronate (FOSAMAX) 70 MG tablet TAKE 1 TABLET BY MOUTH EVERY 7 DAYS. TAKE WITH A FULL GLASS OF WATER ON AN EMPTY STOMACH 12 tablet 1   aspirin EC 81 MG tablet Take 1 tablet (81 mg total) by mouth daily with breakfast. 30 tablet 11   blood glucose meter kit and supplies KIT One touch Ultra. Use three times daily as directed. (FOR ICD E11.65) 1 each 0   calcitRIOL (ROCALTROL) 0.25 MCG capsule Take 0.25 mcg by mouth 3 (three) times a week.     carvedilol (COREG) 12.5 MG tablet Take two tablets by mouth two times daily     Continuous Blood Gluc Sensor (DEXCOM G7 SENSOR) MISC 1 Device by Does not apply route as directed. 9 each 3   furosemide (LASIX) 40 MG tablet Take 1 tablet (40 mg total) by mouth 2 (two) times daily. 60 tablet 2   glucose blood (ONETOUCH  ULTRA) test strip USE TO TEST 4 TIMES A DAY 200 strip 2   hydrALAZINE (APRESOLINE) 25 MG tablet Take one tablet by mouth two times daily 60 tablet 2   insulin degludec (TRESIBA FLEXTOUCH) 100 UNIT/ML FlexTouch Pen Inject 5 Units into the  skin daily. 15 mL 1   insulin lispro (HUMALOG KWIKPEN) 100 UNIT/ML KwikPen Inject 5 Units into the skin 3 (three) times daily. Max daily 30 units 15 mL 3   Insulin Pen Needle 32G X 4 MM MISC 1 each by Does not apply route in the morning and at bedtime. 200 each 3   mupirocin ointment (BACTROBAN) 2 % Apply 1 Application topically 2 (two) times daily. 22 g 0   OneTouch Delica Lancets 33G MISC 1 each by Does not apply route 3 (three) times daily. Use to check glucose three times daily 200 each 2   Potassium Chloride ER 20 MEQ TBCR Take 0.5 tablets (10 mEq total) by mouth daily in the afternoon.     rosuvastatin (CRESTOR) 5 MG tablet TAKE 1 TABLET (5 MG TOTAL) BY MOUTH DAILY. 90 tablet 3   spironolactone (ALDACTONE) 50 MG tablet Take 1 tablet (50 mg total) by mouth daily. 90 tablet 3   No facility-administered medications prior to visit.    Allergies  Allergen Reactions   Amlodipine Swelling   Benadryl [Diphenhydramine Hcl] Hypertension   Citalopram Other (See Comments)    unknown   Farxiga [Dapagliflozin] Other (See Comments)    DKA   Metformin And Related Diarrhea    Lost appetite and weight    Tramadol Other (See Comments)    Felt light headed and dizzy   Crestor [Rosuvastatin] Other (See Comments)    "Feet swelling", makes her feel weak    Review of Systems  Constitutional:  Positive for fatigue.  Neurological:  Positive for weakness.       Objective:    Physical Exam Neurological:     Motor: Weakness present.     Gait: Gait abnormal.     BP 138/72 (BP Location: Left Arm)   Pulse 78   Ht 5' (1.524 m)   Wt 118 lb (53.5 kg)   SpO2 94%   BMI 23.05 kg/m  Wt Readings from Last 3 Encounters:  05/16/23 118 lb (53.5 kg)  05/01/23 116 lb  (52.6 kg)  04/24/23 108 lb 1.9 oz (49 kg)       Assessment & Plan:  CVD (cardiovascular disease) Assessment & Plan: I contacted the Emergency Department and provided a report on Emily Jennings. The patient has a history of cerebrovascular accident (CVA) and reported experiencing fatigue, weakness in the right hand and arm, and slurred speech during her clinic visit.        Note: This chart has been completed using Engineer, civil (consulting) software, and while attempts have been made to ensure accuracy, certain words and phrases may not be transcribed as intended.    Gilmore Laroche, FNP

## 2023-05-16 NOTE — Telephone Encounter (Signed)
Called patient back but no answer 

## 2023-05-16 NOTE — Telephone Encounter (Signed)
Left message to call back  

## 2023-05-16 NOTE — ED Provider Notes (Addendum)
EMERGENCY DEPARTMENT AT [facility] Provider Note   CSN: [id number] Arrival date & time: 05/16/23  1529     History  Chief Complaint  Patient presents with   Aphasia   Headache    Emily Jennings is a 89 y.o. female.   Headache Patient presents with difficulty moving her right arm and some difficulty speaking.  Denies headache for me but had stated headache for nursing.  Had some symptoms of it yesterday.  Unsure when last normal was.  Is right-handed. not on blood thinners.  Has had no difficulty walking.    Past Medical History:  Diagnosis Date   Allergy    Anxiety    ANXIETY DISORDER, GENERALIZED 07/15/2007   Qualifier: Diagnosis of  By: Chipper Herb     Arthritis    Cancer [facility]) 2009   breast, carcinoma in situ left   Carcinoma in situ of breast 05/21/2008   Qualifier: Diagnosis of  By: [name] MD, Margaret  Diagnosed in 2009, completed 5 year course of tamoxifen, no evidence of recurrence    Carotid stenosis    11/16/2005  mild plaque formation and stenosis proximal right ECA   Cataract    Complication of anesthesia    Coronary artery disease    cardiac catheterization on 03/20/2006  LAD mid 40% stenosis, left circumflex mild 40% stenosis, RCA mid-vessel 40% to 50% lesion   EF 60%   Diabetes mellitus    GERD (gastroesophageal reflux disease)    Hernia, inguinal    left   Hyperglycemia    Hypertension    Insomnia 11/16/2011   Low blood potassium    Non-insulin dependent type 2 diabetes mellitus (HCC)    Osteoporosis    Shortness of breath    2D Echocardiogram 01/26/2009   EF of greater than 55%, mild MR, mild TR, normal ventricular function   Thickened endometrium 10/26/2017   Noted by gyne in 2017, missed 6 month follow up, referred in 09/2017   Ventricular tachycardia, non-sustained (HCC)    developed during stress test 02/08/2006, spontaneously aborted, mild reversible apical defect    Home Medications Prior to Admission medications    Medication Sig Start Date End Date Taking? Authorizing Provider  calcitRIOL (ROCALTROL) 0.25 MCG capsule Take 0.25 mcg by mouth 3 (three) times a week.   Yes [provider]  carvedilol (COREG) 12.5 MG tablet Take two tablets by mouth two times daily 04/03/23  Yes [name], MD  furosemide (LASIX) 40 MG tablet Take 1 tablet (40 mg total) by mouth 2 (two) times daily. Patient taking differently: Take 40 mg by mouth daily. 10/09/22  Yes Sharlene Dory, NP  hydrALAZINE (APRESOLINE) 25 MG tablet Take one tablet by mouth two times daily Patient taking differently: Take 25 mg by mouth 3 (three) times daily. Take one tablet by mouth two times daily 04/24/23  Yes [name], MD  insulin degludec (TRESIBA FLEXTOUCH) 100 UNIT/ML FlexTouch Pen Inject 5 Units into the skin daily. 05/01/23  Yes Shamleffer, Konrad Dolores, MD  insulin lispro (HUMALOG KWIKPEN) 100 UNIT/ML KwikPen Inject 5 Units into the skin 3 (three) times daily. Max daily 30 units 05/01/23  Yes Shamleffer, Konrad Dolores, MD  mupirocin ointment (BACTROBAN) 2 % Apply 1 Application topically 2 (two) times daily. 05/01/23  Yes [name], MD  rosuvastatin (CRESTOR) 5 MG tablet TAKE 1 TABLET (5 MG TOTAL) BY MOUTH DAILY. 01/15/23  Yes [name], MD  spironolactone (ALDACTONE) 50 MG tablet  Take 1 tablet (50 mg total) by mouth daily. 05/13/23  Yes Shamleffer, Konrad Dolores, MD  blood glucose meter kit and supplies KIT One touch Ultra. Use three times daily as directed. (FOR ICD E11.65) 10/27/20   Roma Kayser, MD  Continuous Blood Gluc Sensor (DEXCOM G7 SENSOR) MISC 1 Device by Does not apply route as directed. 12/25/22   Shamleffer, Konrad Dolores, MD  glucose blood (ONETOUCH ULTRA) test strip USE TO TEST 4 TIMES A DAY 01/09/23   Roma Kayser, MD  Insulin Pen Needle 32G X 4 MM MISC 1 each by Does not apply route in the morning and at bedtime. 12/25/22   Shamleffer, Konrad Dolores, MD  OneTouch  Delica Lancets 33G MISC 1 each by Does not apply route 3 (three) times daily. Use to check glucose three times daily 10/03/22   Roma Kayser, MD      Allergies    Amlodipine, Benadryl [diphenhydramine hcl], Citalopram, Farxiga [dapagliflozin], Metformin and related, Tramadol, and Crestor [rosuvastatin]    Review of Systems   Review of Systems  Neurological:  Positive for headaches.    Physical Exam Updated Vital Signs BP (!) 140/104   Pulse 82   Temp 98.5 F (36.9 C) (Oral)   Resp 15   Wt 55.4 kg   SpO2 94%   BMI 23.85 kg/m  Physical Exam Vitals and nursing note reviewed.  HENT:     Head: Atraumatic.  Eyes:     Comments: External ocular movements intact with conjugate gaze but does have some nystagmus to the right.  Skin:    General: Skin is warm.  Neurological:     Mental Status: She is alert and oriented to person, place, and time.     Comments: Face symmetric.  Does have some nystagmus gaze to the right.  Does have somewhat slurred speech at times and some word finding.  Finger-nose is off on the right.  Good heel shin bilaterally.  Good grip strength bilaterally.  Good flexion extension in the upper extremities.  Appears to have normal strength in the bilateral lower extremities.     ED Results / Procedures / Treatments   Labs (all labs ordered are listed, but only abnormal results are displayed) Labs Reviewed  CBC WITH DIFFERENTIAL/PLATELET - Abnormal; Notable for the following components:      Result Value   RBC 3.52 (*)    Hemoglobin 10.8 (*)    HCT 33.5 (*)    All other components within normal limits  COMPREHENSIVE METABOLIC PANEL - Abnormal; Notable for the following components:   Sodium 133 (*)    Potassium 3.3 (*)    Chloride 97 (*)    Glucose, Bld 188 (*)    BUN 31 (*)    Creatinine, Ser 1.65 (*)    Calcium 8.1 (*)    Total Protein 4.5 (*)    Albumin 2.6 (*)    GFR, Estimated 30 (*)    All other components within normal limits   URINALYSIS, W/ REFLEX TO CULTURE (INFECTION SUSPECTED) - Abnormal; Notable for the following components:   Color, Urine STRAW (*)    Glucose, UA 150 (*)    Ketones, ur 5 (*)    Protein, ur >=300 (*)    Bacteria, UA RARE (*)    All other components within normal limits  SODIUM - Abnormal; Notable for the following components:   Sodium 134 (*)    All other components within normal limits  MRSA NEXT GEN BY  PCR, NASAL  SODIUM  SODIUM  SODIUM  SODIUM    EKG None  Radiology CT ANGIO HEAD NECK W WO CM  Result Date: 05/16/2023 CLINICAL DATA:  Intracranial hemorrhage EXAM: CT ANGIOGRAPHY HEAD AND NECK WITH AND WITHOUT CONTRAST TECHNIQUE: Multidetector CT imaging of the head and neck was performed using the standard protocol during bolus administration of intravenous contrast. Multiplanar CT image reconstructions and MIPs were obtained to evaluate the vascular anatomy. Carotid stenosis measurements (when applicable) are obtained utilizing NASCET criteria, using the distal internal carotid diameter as the denominator. RADIATION DOSE REDUCTION: This exam was performed according to the departmental dose-optimization program which includes automated exposure control, adjustment of the mA and/or kV according to patient size and/or use of iterative reconstruction technique. CONTRAST:  50mL OMNIPAQUE IOHEXOL 350 MG/ML SOLN COMPARISON:  None Available. FINDINGS: CT HEAD FINDINGS Brain: In intraparenchymal hematoma within the right cerebellum measuring 2.1 x 2.0 x 1.0 cm (2 mL). Mild surrounding edema. No mass effect. The size and configuration of the ventricles and extra-axial CSF spaces are normal. There is hypoattenuation of the periventricular white matter, most commonly indicating chronic ischemic microangiopathy. Skull: The visualized skull base, calvarium and extracranial soft tissues are normal. Sinuses/Orbits: No fluid levels or advanced mucosal thickening of the visualized paranasal sinuses. No  mastoid or middle ear effusion. The orbits are normal. CTA NECK FINDINGS SKELETON: There is no bony spinal canal stenosis. No lytic or blastic lesion. OTHER NECK: Normal pharynx, larynx and major salivary glands. No cervical lymphadenopathy. 1.8 cm nodule in the left lobe of the thyroid gland. UPPER CHEST: No pneumothorax or pleural effusion. No nodules or masses. AORTIC ARCH: There is no calcific atherosclerosis of the aortic arch. Conventional 3 vessel aortic branching pattern. RIGHT CAROTID SYSTEM: Normal without aneurysm, dissection or stenosis. LEFT CAROTID SYSTEM: Normal without aneurysm, dissection or stenosis. VERTEBRAL ARTERIES: Left dominant configuration.There is no dissection, occlusion or flow-limiting stenosis to the skull base (V1-V3 segments). CTA HEAD FINDINGS POSTERIOR CIRCULATION: --Vertebral arteries: Normal V4 segments. --Inferior cerebellar arteries: Normal. --Basilar artery: Normal. --Superior cerebellar arteries: Normal. --Posterior cerebral arteries (PCA): Normal. ANTERIOR CIRCULATION: --Intracranial internal carotid arteries: Normal. --Anterior cerebral arteries (ACA): Normal. Both A1 segments are present. Patent anterior communicating artery (a-comm). --Middle cerebral arteries (MCA): Normal. VENOUS SINUSES: As permitted by contrast timing, patent. ANATOMIC VARIANTS: None Review of the MIP images confirms the above findings. IMPRESSION: 1. Intraparenchymal hematoma within the right cerebellum measuring 2.1 x 2.0 x 1.0 cm with mild surrounding edema. No mass effect. 2. No emergent large vessel occlusion or high-grade stenosis of the intracranial arteries. 3. No aneurysm or vascular malformation. 4. Incidental left thyroid nodule measuring 1.8 cm. Recommend non-emergent thyroid ultrasound if clinically warranted given patient age. Reference: J Am Coll Radiol. 2015 Feb;12(2): 143-50 Critical Value/emergent results were called by telephone at the time of interpretation on 05/16/2023 at 7:43 pm  to provider Benjiman Core , who verbally acknowledged these results. Electronically Signed   By: Deatra Robinson M.D.   On: 05/16/2023 19:43   DG Chest Portable 1 View  Result Date: 05/16/2023 CLINICAL DATA:  Weakness EXAM: PORTABLE CHEST 1 VIEW COMPARISON:  X-ray 09/16/2022 FINDINGS: Mildly enlarged cardiopericardial silhouette with a calcified aorta. Chronic lung changes with some interstitial components. No consolidation, pneumothorax or edema. Overlapping cardiac leads. Osteopenia and degenerative change. IMPRESSION: Enlarged cardiopericardial silhouette. Chronic lung changes. Hyperinflation. Electronically Signed   By: Karen Kays M.D.   On: 05/16/2023 16:54    Procedures Procedures    Medications  Ordered in ED Medications  clevidipine (CLEVIPREX) infusion 0.5 mg/mL (10 mg/hr Intravenous New Bag/Given 05/16/23 2140)  sodium chloride (hypertonic) 3 % solution ( Intravenous New Bag/Given 05/16/23 2001)  Chlorhexidine Gluconate Cloth 2 % PADS 6 each (has no administration in time range)  Oral care mouth rinse (has no administration in time range)   stroke: early stages of recovery book (has no administration in time range)  acetaminophen (TYLENOL) tablet 650 mg (has no administration in time range)    Or  acetaminophen (TYLENOL) 160 MG/5ML solution 650 mg (has no administration in time range)    Or  acetaminophen (TYLENOL) suppository 650 mg (has no administration in time range)  senna-docusate (Senokot-S) tablet 1 tablet (has no administration in time range)  pantoprazole (PROTONIX) injection 40 mg (has no administration in time range)  iohexol (OMNIPAQUE) 350 MG/ML injection 50 mL (50 mLs Intravenous Contrast Given 05/16/23 1921)    ED Course/ Medical Decision Making/ A&P                                 Medical Decision Making Amount and/or Complexity of Data Reviewed Labs: ordered. Radiology: ordered.  Risk Prescription drug management. Decision regarding  hospitalization.   Patient with difficulty speaking and difficulty moving right upper extremity.  Last normal was at the latest sometime yesterday.  Not a TNK candidate due to time of onset.  Differential diagnosis does include stroke.  Will get head CT including CTA.  Will get basic blood work.  CT scan independently interpreted and shows intracranial hemorrhage.  Blood pressure is elevated and will start aggressive blood pressure control.  Started on Cleviprex.  Discussed with Dr. Selina Cooley from neurology.  She started 3% saline.  Transfer to [facility] for admission.  CRITICAL CARE Performed by: Benjiman Core Total critical care time: 30 minutes Critical care time was exclusive of separately billable procedures and treating other patients. Critical care was necessary to treat or prevent imminent or life-threatening deterioration. Critical care was time spent personally by me on the following activities: development of treatment plan with patient and/or surrogate as well as nursing, discussions with consultants, evaluation of patient's response to treatment, examination of patient, obtaining history from patient or surrogate, ordering and performing treatments and interventions, ordering and review of laboratory studies, ordering and review of radiographic studies, pulse oximetry and re-evaluation of patient's condition.  Dr. Amada Jupiter is the accepting physician.          Final Clinical Impression(s) / ED Diagnoses Final diagnoses:  Right-sided nontraumatic intracerebral hemorrhage, unspecified cerebral location [facility])    Rx / DC Orders ED Discharge Orders     None         Benjiman Core, MD 05/16/23 2357    Benjiman Core, MD 05/22/23 1039

## 2023-05-16 NOTE — ED Notes (Signed)
AC to bring hypertonic saline

## 2023-05-16 NOTE — ED Notes (Signed)
Pickering at bedside. Has been made aware of elevated BP 226/88

## 2023-05-16 NOTE — Progress Notes (Signed)
I contacted the Emergency Department and provided a report on [name]. The patient has a history of cerebrovascular accident (CVA) and reported experiencing fatigue, weakness in the right hand and arm, and slurred speech during her clinic visit.

## 2023-05-16 NOTE — ED Triage Notes (Signed)
Pt bib family with complaints of headache behind eyes and slurred speech that started last night around 2130. No other complaints. No previous strokes. Pt also states that it is hard to grip with right hand. Pt has equal grip strength.

## 2023-05-16 NOTE — Progress Notes (Signed)
Neurology progress note  This is a 89 yo woman with hx DM2, HTN who presented with postorbital headache and dysarthria with R handed incoordination since 2130 last night. She is not on anticoagulation. CT head showed R cerebellar IPH with surrounding edema. Most recent BP 226/88.  Recommendations discussed with Dr. Rubin Payor EDP:  - STAT transfer to [facility] 4N neuro ICU under accepting MD Dr. Ritta Slot - Clevidipine gtt for SBP <150 - CTA pending - SCDs for DVT prophylaxis - Head CT in q 6 hrs assess stability of ICH - HOB elevated 30 degrees - Hypertonic saline 3% continuous PIV per protocol - No indication for neurosurgical intervention at this time  I placed a bed request and bed control is checking with AC about 4N availability. If patient is not able to get a 4N bed in short order she will need to be transferred to [facility] ED to ED.  Bing Neighbors, MD [facility] [phone]  If 7pm- 7am, please page neurology on call as listed in AMION.

## 2023-05-16 NOTE — Assessment & Plan Note (Signed)
I contacted the Emergency Department and provided a report on [name]. The patient has a history of cerebrovascular accident (CVA) and reported experiencing fatigue, weakness in the right hand and arm, and slurred speech during her clinic visit.

## 2023-05-16 NOTE — Progress Notes (Signed)
Arrived onto 4N rm 29 via [facility].

## 2023-05-17 ENCOUNTER — Inpatient Hospital Stay (HOSPITAL_COMMUNITY): Payer: Medicare Other

## 2023-05-17 ENCOUNTER — Other Ambulatory Visit (HOSPITAL_COMMUNITY): Payer: Medicare Other

## 2023-05-17 DIAGNOSIS — I6389 Other cerebral infarction: Secondary | ICD-10-CM

## 2023-05-17 DIAGNOSIS — Z66 Do not resuscitate: Secondary | ICD-10-CM | POA: Insufficient documentation

## 2023-05-17 DIAGNOSIS — Z7189 Other specified counseling: Secondary | ICD-10-CM | POA: Diagnosis not present

## 2023-05-17 DIAGNOSIS — I1A0 Resistant hypertension: Secondary | ICD-10-CM | POA: Diagnosis not present

## 2023-05-17 DIAGNOSIS — J383 Other diseases of vocal cords: Secondary | ICD-10-CM

## 2023-05-17 DIAGNOSIS — I614 Nontraumatic intracerebral hemorrhage in cerebellum: Secondary | ICD-10-CM | POA: Diagnosis not present

## 2023-05-17 LAB — GLUCOSE, CAPILLARY
Glucose-Capillary: 118 mg/dL — ABNORMAL HIGH (ref 70–99)
Glucose-Capillary: 135 mg/dL — ABNORMAL HIGH (ref 70–99)
Glucose-Capillary: 159 mg/dL — ABNORMAL HIGH (ref 70–99)
Glucose-Capillary: 214 mg/dL — ABNORMAL HIGH (ref 70–99)
Glucose-Capillary: 273 mg/dL — ABNORMAL HIGH (ref 70–99)
Glucose-Capillary: 83 mg/dL (ref 70–99)

## 2023-05-17 LAB — ECHOCARDIOGRAM COMPLETE
AR max vel: 1.33 cm2
AV Area VTI: 1.33 cm2
AV Area mean vel: 1.34 cm2
AV Mean grad: 18.5 mmHg
AV Peak grad: 33.2 mmHg
Ao pk vel: 2.88 m/s
Area-P 1/2: 3.23 cm2
Height: 60 in
MV VTI: 1.77 cm2
S' Lateral: 1.9 cm
Weight: 1954.16 [oz_av]

## 2023-05-17 LAB — BASIC METABOLIC PANEL
Anion gap: 14 (ref 5–15)
BUN: 33 mg/dL — ABNORMAL HIGH (ref 8–23)
CO2: 22 mmol/L (ref 22–32)
Calcium: 8.2 mg/dL — ABNORMAL LOW (ref 8.9–10.3)
Chloride: 107 mmol/L (ref 98–111)
Creatinine, Ser: 1.87 mg/dL — ABNORMAL HIGH (ref 0.44–1.00)
GFR, Estimated: 26 mL/min — ABNORMAL LOW (ref >60)
Glucose, Bld: 215 mg/dL — ABNORMAL HIGH (ref 70–99)
Potassium: 3.1 mmol/L — ABNORMAL LOW (ref 3.5–5.1)
Sodium: 143 mmol/L (ref 135–145)

## 2023-05-17 LAB — SODIUM
Sodium: 147 mmol/L — ABNORMAL HIGH (ref 135–145)
Sodium: 149 mmol/L — ABNORMAL HIGH (ref 135–145)

## 2023-05-17 MED ORDER — LABETALOL HCL 5 MG/ML IV SOLN
20.0000 mg | INTRAVENOUS | Status: DC | PRN
  Administered 2023-05-17 – 2023-05-23 (×15): 20 mg via INTRAVENOUS
  Filled 2023-05-17 (×16): qty 4

## 2023-05-17 MED ORDER — FAMOTIDINE IN NACL 20-0.9 MG/50ML-% IV SOLN
20.0000 mg | Freq: Every day | INTRAVENOUS | Status: DC
  Administered 2023-05-17: 20 mg via INTRAVENOUS
  Filled 2023-05-17: qty 50

## 2023-05-17 MED ORDER — HYDRALAZINE HCL 25 MG PO TABS
25.0000 mg | ORAL_TABLET | Freq: Three times a day (TID) | ORAL | Status: DC
  Administered 2023-05-17: 25 mg via ORAL
  Filled 2023-05-17: qty 1

## 2023-05-17 MED ORDER — INSULIN ASPART 100 UNIT/ML IJ SOLN
0.0000 [IU] | INTRAMUSCULAR | Status: DC
  Administered 2023-05-17: 8 [IU] via SUBCUTANEOUS
  Administered 2023-05-17: 5 [IU] via SUBCUTANEOUS

## 2023-05-17 MED ORDER — ALBUTEROL SULFATE (2.5 MG/3ML) 0.083% IN NEBU
2.5000 mg | INHALATION_SOLUTION | RESPIRATORY_TRACT | Status: DC | PRN
  Administered 2023-05-18 (×2): 2.5 mg via RESPIRATORY_TRACT
  Filled 2023-05-17 (×2): qty 3

## 2023-05-17 MED ORDER — HYDRALAZINE HCL 50 MG PO TABS
50.0000 mg | ORAL_TABLET | Freq: Three times a day (TID) | ORAL | Status: DC
  Administered 2023-05-17 – 2023-05-18 (×3): 50 mg via ORAL
  Filled 2023-05-17 (×3): qty 1

## 2023-05-17 MED ORDER — FUROSEMIDE 10 MG/ML IJ SOLN
20.0000 mg | Freq: Once | INTRAMUSCULAR | Status: AC
  Administered 2023-05-17: 20 mg via INTRAVENOUS
  Filled 2023-05-17: qty 2

## 2023-05-17 MED ORDER — POTASSIUM CHLORIDE CRYS ER 20 MEQ PO TBCR
40.0000 meq | EXTENDED_RELEASE_TABLET | Freq: Once | ORAL | Status: AC
  Administered 2023-05-17: 40 meq via ORAL
  Filled 2023-05-17: qty 2

## 2023-05-17 MED ORDER — SPIRONOLACTONE 25 MG PO TABS
50.0000 mg | ORAL_TABLET | Freq: Every day | ORAL | Status: DC
  Administered 2023-05-17 – 2023-05-19 (×3): 50 mg via ORAL
  Filled 2023-05-17 (×4): qty 2

## 2023-05-17 MED ORDER — METHYLPREDNISOLONE SODIUM SUCC 125 MG IJ SOLR
80.0000 mg | Freq: Every day | INTRAMUSCULAR | Status: AC
  Administered 2023-05-18 – 2023-05-19 (×2): 80 mg via INTRAVENOUS
  Filled 2023-05-17 (×2): qty 2

## 2023-05-17 MED ORDER — INSULIN ASPART 100 UNIT/ML IJ SOLN
0.0000 [IU] | INTRAMUSCULAR | Status: DC
  Administered 2023-05-17: 3 [IU] via SUBCUTANEOUS
  Administered 2023-05-17: 4 [IU] via SUBCUTANEOUS
  Administered 2023-05-18: 3 [IU] via SUBCUTANEOUS
  Administered 2023-05-18: 11 [IU] via SUBCUTANEOUS
  Administered 2023-05-18: 4 [IU] via SUBCUTANEOUS
  Administered 2023-05-18: 11 [IU] via SUBCUTANEOUS
  Administered 2023-05-18: 4 [IU] via SUBCUTANEOUS
  Administered 2023-05-19: 3 [IU] via SUBCUTANEOUS
  Administered 2023-05-19 – 2023-05-20 (×2): 7 [IU] via SUBCUTANEOUS
  Administered 2023-05-20: 4 [IU] via SUBCUTANEOUS
  Administered 2023-05-20: 11 [IU] via SUBCUTANEOUS
  Administered 2023-05-20: 4 [IU] via SUBCUTANEOUS
  Administered 2023-05-21: 11 [IU] via SUBCUTANEOUS
  Administered 2023-05-21 (×2): 7 [IU] via SUBCUTANEOUS
  Administered 2023-05-21: 3 [IU] via SUBCUTANEOUS
  Administered 2023-05-22 (×2): 4 [IU] via SUBCUTANEOUS
  Administered 2023-05-22: 15 [IU] via SUBCUTANEOUS

## 2023-05-17 MED ORDER — RACEPINEPHRINE HCL 2.25 % IN NEBU
0.5000 mL | INHALATION_SOLUTION | Freq: Once | RESPIRATORY_TRACT | Status: AC
  Administered 2023-05-17: 0.5 mL via RESPIRATORY_TRACT
  Filled 2023-05-17: qty 0.5

## 2023-05-17 MED ORDER — CARVEDILOL 12.5 MG PO TABS
12.5000 mg | ORAL_TABLET | Freq: Two times a day (BID) | ORAL | Status: DC
  Administered 2023-05-17 – 2023-05-19 (×5): 12.5 mg via ORAL
  Filled 2023-05-17 (×5): qty 1

## 2023-05-17 MED ORDER — ALBUTEROL SULFATE (2.5 MG/3ML) 0.083% IN NEBU: 2.5000 mg | INHALATION_SOLUTION | RESPIRATORY_TRACT | Status: DC

## 2023-05-17 MED ORDER — PANTOPRAZOLE SODIUM 40 MG PO TBEC
40.0000 mg | DELAYED_RELEASE_TABLET | Freq: Every day | ORAL | Status: DC
  Administered 2023-05-17 – 2023-05-18 (×2): 40 mg via ORAL
  Filled 2023-05-17 (×2): qty 1

## 2023-05-17 MED ORDER — METHYLPREDNISOLONE SODIUM SUCC 125 MG IJ SOLR
125.0000 mg | Freq: Once | INTRAMUSCULAR | Status: AC
  Administered 2023-05-17: 125 mg via INTRAVENOUS
  Filled 2023-05-17: qty 2

## 2023-05-17 NOTE — Progress Notes (Signed)
Echocardiogram 2D Echocardiogram has been performed.  Warren Lacy Terissa Haffey RDCS 05/17/2023, 1:00 PM

## 2023-05-17 NOTE — Evaluation (Addendum)
Occupational Therapy Evaluation Patient Details Name: Emily Jennings MRN: [id number] DOB: July 24, 1934 Today's Date: 05/17/2023   History of Present Illness Pt is a 89 y/o female presenting on 8/28 with dizziness. CT found with nontraumatic intracerebral hemorrhage R cerebellum.  PMH includes: anxiety, breast cancer, carotid stenosis, DM, HTN, osteoporosis, insomnia.   Clinical Impression   PTA patient independent with ADLs, light IADLs, mobility (using cane PRN).  Admitted for above and presents with problem list below, including impaired cognition, decreased activity tolerance, R sided decreased sensation and coordination, mild dizziness.  Pt reports having blurry vision on R, some nystagmus noted with visual scanning but inconsistent- recommend further visual assessment. Patient completing transfers with min assist, functional mobility with min assist using RW, and ADLs with up to min assist.  She requires cueing for safety and R UE awareness during session, mild impulsivity noted.  Based on performance today, believe she will best benefit from continued OT services acutely and after dc at [facility] level, given 24/7 support.  Will follow.     Pt cleared by MD, verbal order for participation in OT prior to session. VSS during session.       If plan is discharge home, recommend the following: A little help with walking and/or transfers;A little help with bathing/dressing/bathroom;Direct supervision/assist for medications management;Direct supervision/assist for financial management;Assist for transportation;Assistance with cooking/housework    Functional Status Assessment  Patient has had a recent decline in their functional status and demonstrates the ability to make significant improvements in function in a reasonable and predictable amount of time.  Equipment Recommendations  BSC/3in1    Recommendations for Other Services       Precautions / Restrictions Precautions Precautions:  Fall Precaution Comments: SBP 130-150 Restrictions Weight Bearing Restrictions: No      Mobility Bed Mobility               General bed mobility comments: OOB in recliner    Transfers Overall transfer level: Needs assistance Equipment used: Rolling walker (2 wheels) Transfers: Sit to/from Stand Sit to Stand: Min assist           General transfer comment: to power up and steady, pt pulling up on RW      Balance Overall balance assessment: Needs assistance Sitting-balance support: No upper extremity supported, Feet supported Sitting balance-Leahy Scale: Fair     Standing balance support: Bilateral upper extremity supported, During functional activity, No upper extremity supported Standing balance-Leahy Scale: Poor Standing balance comment: relies on external support                           ADL either performed or assessed with clinical judgement   ADL Overall ADL's : Needs assistance/impaired     Grooming: Minimal assistance;Sitting           Upper Body Dressing : Minimal assistance;Sitting   Lower Body Dressing: Minimal assistance;Sit to/from stand   Toilet Transfer: Minimal assistance;Ambulation;Rolling walker (2 wheels)   Toileting- Clothing Manipulation and Hygiene: Sit to/from stand;Minimal assistance       Functional mobility during ADLs: Minimal assistance;Rolling walker (2 wheels);Cueing for safety       Vision Patient Visual Report: Blurring of vision;Other (comment) (changes on R side) Vision Assessment?: Yes Eye Alignment: Within Functional Limits Alignment/Gaze Preference: Within Defined Limits Tracking/Visual Pursuits: Other (comment) (mild nystagmus noted with scanning towards R side, jumping to L but inconsistent) Additional Comments: pt reports vision feels "blurry" on R side; continue  assessment     Perception         Praxis         Pertinent Vitals/Pain Pain Assessment Pain Assessment: No/denies pain      Extremity/Trunk Assessment Upper Extremity Assessment Upper Extremity Assessment: Right hand dominant;RUE deficits/detail;Generalized weakness RUE Deficits / Details: some decreased sesnation and coordination, grossly  3+/5 RUE Sensation: decreased light touch RUE Coordination: decreased fine motor;decreased gross motor   Lower Extremity Assessment Lower Extremity Assessment: Defer to PT evaluation       Communication Communication Communication: Other (comment) (some expressive difficulties at times)   Cognition Arousal: Alert Behavior During Therapy: WFL for tasks assessed/performed Overall Cognitive Status: Impaired/Different from baseline Area of Impairment: Attention, Memory, Following commands, Safety/judgement, Awareness, Problem solving                   Current Attention Level: Sustained Memory: Decreased short-term memory Following Commands: Follows one step commands consistently, Follows one step commands with increased time, Follows multi-step commands inconsistently Safety/Judgement: Decreased awareness of safety, Decreased awareness of deficits Awareness: Emergent Problem Solving: Slow processing, Difficulty sequencing, Requires verbal cues General Comments: patient with some decreased problem solving and attention, requires redirection at times     General Comments  SBP from 146 to 130 during session, left with PT who was assessing BP further    Exercises     Shoulder Instructions      Home Living Family/patient expects to be discharged to:: Private residence Living Arrangements: Spouse/significant other Available Help at Discharge: Family;Available 24 hours/day Type of Home: House Home Access: Stairs to enter [employer] of Steps: 1 Entrance Stairs-Rails: None Home Layout: One level     Bathroom Shower/Tub: Chief Strategy Officer: Standard     Home Equipment: Cane - single point;Shower seat          Prior  Functioning/Environment Prior Level of Function : Independent/Modified Independent             Mobility Comments: independent, using cane as needed ADLs Comments: independent ADls, light IADls and med mgmt (she reports not driving recently)        OT Problem List: Decreased strength;Decreased activity tolerance;Impaired balance (sitting and/or standing);Decreased knowledge of use of DME or AE;Decreased knowledge of precautions;Impaired UE functional use;Decreased cognition;Decreased coordination      OT Treatment/Interventions: Self-care/ADL training;Neuromuscular education;DME and/or AE instruction;Therapeutic activities;Cognitive remediation/compensation;Visual/perceptual remediation/compensation;Balance training;Patient/family education    OT Goals(Current goals can be found in the care plan section) Acute Rehab OT Goals Patient Stated Goal: home OT Goal Formulation: With patient Time For Goal Achievement: 06/01/23 Potential to Achieve Goals: Fair  OT Frequency: Min 1X/week    Co-evaluation              AM-PAC OT "6 Clicks" Daily Activity     Outcome Measure Help from another person eating meals?: A Little Help from another person taking care of personal grooming?: A Little Help from another person toileting, which includes using toliet, bedpan, or urinal?: A Little Help from another person bathing (including washing, rinsing, drying)?: A Little Help from another person to put on and taking off regular upper body clothing?: A Little Help from another person to put on and taking off regular lower body clothing?: A Little 6 Click Score: 18   End of Session Equipment Utilized During Treatment: Gait belt;Rolling walker (2 wheels) Nurse Communication: Mobility status  Activity Tolerance: Patient tolerated treatment well Patient left: Other (comment) (with PT)  OT Visit Diagnosis:  Other abnormalities of gait and mobility (R26.89);Muscle weakness (generalized)  (M62.81);Other symptoms and signs involving cognitive function                Time: 1610-9604 OT Time Calculation (min): 17 min Charges:  OT General Charges $OT Visit: 1 Visit OT Evaluation $OT Eval Moderate Complexity: 1 Mod  Barry Brunner, OT Acute Rehabilitation Services Office [phone]   Chancy Milroy 05/17/2023, 12:12 PM

## 2023-05-17 NOTE — Plan of Care (Signed)
  Problem: Education: Goal: Knowledge of disease or condition will improve Outcome: Progressing   Problem: Intracerebral Hemorrhage Tissue Perfusion: Goal: Complications of Intracerebral Hemorrhage will be minimized Outcome: Progressing   Problem: Coping: Goal: Will verbalize positive feelings about self Outcome: Progressing

## 2023-05-17 NOTE — IPAL (Signed)
  Interdisciplinary Goals of Care Family Meeting   Date carried out: 05/17/2023  Location of the meeting: Bedside  Member's involved: Nurse Practitioner, Bedside Registered Nurse, and Family Member or next of kin  Durable Power of Attorney or acting medical decision maker: pt -- spouse if pt unable , who has asked for son to help.   Discussion: We discussed goals of care for Emily Jennings .  We talked about her airway sounds, which are upper airway noises-- and what we plan to do to aid in alleviating these symptoms  We talked about what our plans would be if her breathing were to decompensate to where she were not able to safely breathe -- would Emily Jennings wish to be intubated and placed on mechanical life support.  It was very clear that she would not -- both she and her son indicated this. Son elaborated further on past conversations that Emily Jennings has shared regarding this stance.  I then talked about code status and after explaining what different code statuses indicate, decision reached to change code status to DNR/I    Code status:   Code Status: Limited: Do not attempt resuscitation (DNR) -DNR-LIMITED -Do Not Intubate/DNI    Disposition: Continue current acute care  Time spent for the meeting: 21 min    Emily Clam, NP  05/17/2023, 7:29 PM

## 2023-05-17 NOTE — Consult Note (Addendum)
NAME:  Emily Jennings, MRN:  [id number], DOB:  12/19/1933, LOS: 1 ADMISSION DATE:  05/16/2023, CONSULTATION DATE:  05/17/23 REFERRING MD:  Pearlean Brownie - neuro , CHIEF COMPLAINT:  wheezing    History of Present Illness:  89 yo F PMH prior CVA HTN CAD DM CKD 3b who was admitted to neuro service 8/28 after presenting to ED w dizziness. Found to have associated poor R sided coordination, imaging revealed a R cerebellar hemorrhage. Started on a clevi gtt for HTN.   On 8/29 the patient went for follow up CT and had some wheezing.  PCCM is consulted in this setting    In talking with son at bedside, she has had periods of this "wheezing" sound on and off for months. Has not previously felt like her throat is tight or closing, and does not right now. Does not recall ever seeing ENT. Feels like she is breathing really comfortably right now   Pertinent  Medical History  CVA CAD HTN DM2 Hydronephrosis  CKD3b Diastolic HF  Mild AS  Hx breast cancer Significant Hospital Events: Including procedures, antibiotic start and stop dates in addition to other pertinent events   8/28 cerebellar bleed. Clevi gtt 8/29 PCCM consult for wheeze   Interim History / Subjective:  Feels like her breathing is better  Felt like it was worse laying flat   Son at bedside   Objective   Blood pressure 129/63, pulse 73, temperature 97.7 F (36.5 C), temperature source Oral, resp. rate 19, weight 55.4 kg, SpO2 99%.        Intake/Output Summary (Last 24 hours) at 05/17/2023 1901 Last data filed at 05/17/2023 1700 Gross per 24 hour  Intake 1810.06 ml  Output --  Net 1810.06 ml   Filed Weights   05/16/23 2135  Weight: 55.4 kg    Examination: General: chronically ill elderly F NAD  HENT: NCAT. [place]iska in place. Upper airway turbulence. No tongue/lip swelling. No posterior pharyngeal swelling or erythema. Mallampati II  Lungs: CTA, even unlabored  Cardiovascular: rr cap refill brisk s1s2  Abdomen: soft ndnt   Extremities: no acute joint deformity  Neuro: Following commands. Some mild dysarthria no focal deficit.  GU: defer  Resolved Hospital Problem list     Assessment & Plan:   Cerebellar Hemorrhage with brain compression  Suspected VCD Hypokalemia DM2 with hyperglycemia CKD 3b HTN HFpEF Small pericardial effusion  Mild-moderate AS  Anemia of chronic dz  DNR status // code status discussion  -we are consulted 8/29 for her resp status/wheezing. Her lungs are clear -- noise is upper airway. No throat tightness, swelling, no difficulty breathing right now, no distress.  P -DNR/I -- see IPAL note. Talked about BiPAP trial if indicated, but if she were to decompensate to where adv airway is indicated, would want to be made comfortable at that time instead.  -think this is most likely VCD but will try: -will try some racemic epi and steroids, PRN BiPAP  -re-eval in AM for continuation of IV steroids (ordered for 125 x1 now, 40BID for 2d starting tomorrow)  -consider ENT consult tomorrow  -CVA is being managed by primary -on HTS, follow Na  -SBP goal to liberalize to <160 later tonight. 130-150 in interim. Clevi gtt and PRN labetalol available + her home spiro, hydral, coreg -- with her high aldosterone:renin, wonder if incr spiro might be an option if needed.  -replace K PRN  -rSSI -- with new steroids might transiently need to incr coverage  -  PT/OT/SLP     PCCM will follow   Best Practice (right click and "Reselect all SmartList Selections" daily)   Diet/type: Regular consistency (see orders) DVT prophylaxis: SCD GI prophylaxis: H2B Lines: N/A Foley:  N/A Code Status:  DNR Last date of multidisciplinary goals of care discussion [8/29]  Labs   CBC: Recent Labs  Lab 05/16/23 1727  WBC 5.5  NEUTROABS 3.8  HGB 10.8*  HCT 33.5*  MCV 95.2  PLT 170    Basic Metabolic Panel: Recent Labs  Lab 05/16/23 1727 05/16/23 1956 05/17/23 0717 05/17/23 1626  NA 133* 134* 143  147*  K 3.3*  --  3.1*  --   CL 97*  --  107  --   CO2 27  --  22  --   GLUCOSE 188*  --  215*  --   BUN 31*  --  33*  --   CREATININE 1.65*  --  1.87*  --   CALCIUM 8.1*  --  8.2*  --    GFR: Estimated Creatinine Clearance: 16.6 mL/min (A) (by C-G formula based on SCr of 1.87 mg/dL (H)). Recent Labs  Lab 05/16/23 1727  WBC 5.5    Liver Function Tests: Recent Labs  Lab 05/16/23 1727  AST 22  ALT 15  ALKPHOS 64  BILITOT 0.9  PROT 4.5*  ALBUMIN 2.6*   No results for input(s): "LIPASE", "AMYLASE" in the last 168 hours. No results for input(s): "AMMONIA" in the last 168 hours.  ABG    Component Value Date/Time   HCO3 25.4 04/14/2023 0455   ACIDBASEDEF 0.6 04/14/2023 0455   O2SAT 78.1 04/14/2023 0455     Coagulation Profile: No results for input(s): "INR", "PROTIME" in the last 168 hours.  Cardiac Enzymes: No results for input(s): "CKTOTAL", "CKMB", "CKMBINDEX", "TROPONINI" in the last 168 hours.  HbA1C: Hemoglobin A1C  Date/Time Value Ref Range Status  05/01/2023 02:19 PM 7.5 (A) 4.0 - 5.6 % Final  12/25/2022 01:44 PM 6.9 (A) 4.0 - 5.6 % Final   HbA1c, POC (controlled diabetic range)  Date/Time Value Ref Range Status  07/06/2022 01:16 PM 7.4 (A) 0.0 - 7.0 % Final  03/08/2022 11:25 AM 7.2 (A) 0.0 - 7.0 % Final   Hgb A1c MFr Bld  Date/Time Value Ref Range Status  03/22/2022 06:43 PM 7.2 (H) 4.8 - 5.6 % Final    Comment:    (NOTE) Pre diabetes:          5.7%-6.4%  Diabetes:              >6.4%  Glycemic control for   <7.0% adults with diabetes     CBG: Recent Labs  Lab 05/17/23 0356 05/17/23 0728 05/17/23 1112 05/17/23 1616  GLUCAP 273* 214* 159* 83    Review of Systems:   Review of Systems  Constitutional: Negative.   HENT:  Negative for congestion and sore throat.   Eyes: Negative.   Respiratory:  Positive for cough, shortness of breath, wheezing and stridor. Negative for hemoptysis and sputum production.   Cardiovascular: Negative.    Gastrointestinal: Negative.   Genitourinary: Negative.   Musculoskeletal: Negative.   Skin: Negative.   Neurological:  Positive for focal weakness.     Past Medical History:  She,  has a past medical history of Allergy, Anxiety, ANXIETY DISORDER, GENERALIZED (07/15/2007), Arthritis, Cancer (HCC) (2009), Carcinoma in situ of breast (05/21/2008), Carotid stenosis, Cataract, Complication of anesthesia, Coronary artery disease, Diabetes mellitus, GERD (gastroesophageal reflux disease), Hernia, inguinal, Hyperglycemia,  Hypertension, Insomnia (11/16/2011), Low blood potassium, Non-insulin dependent type 2 diabetes mellitus (HCC), Osteoporosis, Shortness of breath, Thickened endometrium (10/26/2017), and Ventricular tachycardia, non-sustained (HCC).   Surgical History:   Past Surgical History:  Procedure Laterality Date   BREAST LUMPECTOMY Left 2009   Left breast 2009   CATARACT EXTRACTION W/PHACO Left 10/28/2014   Procedure: PHACO EMULSION CATARACT EXTRACTION WITH INTRAOCULAR LENS IMPLANT LEFT EYE (IOC);  Surgeon: Chalmers Guest, MD;  Location: [facility] OR;  Service: Ophthalmology;  Laterality: Left;   COLONOSCOPY     cyst removed from left foot     REFRACTIVE SURGERY Left      Social History:   reports that she has never smoked. She has never been exposed to tobacco smoke. She has never used smokeless tobacco. She reports that she does not drink alcohol and does not use drugs.   Family History:  Her family history includes Arthritis in an other family member; Asthma in an other family member; Cancer in her father; Colon cancer in her father and paternal aunt; Diabetes in an other family member; Heart disease (age of onset: 49) in her brother; Heart disease (age of onset: 52) in her brother; Hyperlipidemia in her mother; Hypertension in her mother; Stroke in her mother; Urticaria in her mother. There is no history of Esophageal cancer, Stomach cancer, or Rectal cancer.   Allergies Allergies  Allergen  Reactions   Amlodipine Swelling   Benadryl [Diphenhydramine Hcl] Hypertension   Citalopram Other (See Comments)    unknown   Farxiga [Dapagliflozin] Other (See Comments)    DKA   Metformin And Related Diarrhea    Lost appetite and weight    Tramadol Other (See Comments)    Felt light headed and dizzy   Crestor [Rosuvastatin] Other (See Comments)    "Feet swelling", makes her feel weak     Home Medications  Prior to Admission medications   Medication Sig Start Date End Date Taking? Authorizing Provider  calcitRIOL (ROCALTROL) 0.25 MCG capsule Take 0.25 mcg by mouth 3 (three) times a week.   Yes [provider]  carvedilol (COREG) 12.5 MG tablet Take two tablets by mouth two times daily 04/03/23  Yes [name], MD  furosemide (LASIX) 40 MG tablet Take 1 tablet (40 mg total) by mouth 2 (two) times daily. Patient taking differently: Take 40 mg by mouth daily. 10/09/22  Yes Sharlene Dory, NP  hydrALAZINE (APRESOLINE) 25 MG tablet Take one tablet by mouth two times daily Patient taking differently: Take 25 mg by mouth 3 (three) times daily. Take one tablet by mouth two times daily 04/24/23  Yes [name], MD  insulin degludec (TRESIBA FLEXTOUCH) 100 UNIT/ML FlexTouch Pen Inject 5 Units into the skin daily. 05/01/23  Yes Shamleffer, Konrad Dolores, MD  insulin lispro (HUMALOG KWIKPEN) 100 UNIT/ML KwikPen Inject 5 Units into the skin 3 (three) times daily. Max daily 30 units 05/01/23  Yes Shamleffer, Konrad Dolores, MD  mupirocin ointment (BACTROBAN) 2 % Apply 1 Application topically 2 (two) times daily. 05/01/23  Yes [name], MD  rosuvastatin (CRESTOR) 5 MG tablet TAKE 1 TABLET (5 MG TOTAL) BY MOUTH DAILY. 01/15/23  Yes [name], MD  spironolactone (ALDACTONE) 50 MG tablet Take 1 tablet (50 mg total) by mouth daily. 05/13/23  Yes Shamleffer, Konrad Dolores, MD  blood glucose meter kit and supplies KIT One touch Ultra. Use three times daily as  directed. (FOR ICD E11.65) 10/27/20   Roma Kayser, MD  Continuous  Blood Gluc Sensor (DEXCOM G7 SENSOR) MISC 1 Device by Does not apply route as directed. 12/25/22   Shamleffer, Konrad Dolores, MD  glucose blood (ONETOUCH ULTRA) test strip USE TO TEST 4 TIMES A DAY 01/09/23   Roma Kayser, MD  Insulin Pen Needle 32G X 4 MM MISC 1 each by Does not apply route in the morning and at bedtime. 12/25/22   Shamleffer, Konrad Dolores, MD  OneTouch Delica Lancets 33G MISC 1 each by Does not apply route 3 (three) times daily. Use to check glucose three times daily 10/03/22   Roma Kayser, MD     Critical care time: na High MDM      Tessie Fass MSN, AGACNP-BC [facility] Pulmonary/Critical Care Medicine Amion for pager  05/17/2023, 8:13 PM

## 2023-05-17 NOTE — Evaluation (Signed)
Physical Therapy Evaluation Patient Details Name: Emily Jennings MRN: [id number] DOB: June 29, 1934 Today's Date: 05/17/2023  History of Present Illness  Pt is a 89 y/o female presenting on 8/28 with dizziness. CT found with nontraumatic intracerebral hemorrhage R cerebellum.  PMH includes: anxiety, breast cancer, carotid stenosis, DM, HTN, osteoporosis, insomnia.  Clinical Impression  Pt admitted with above diagnosis. Pt from home where she was independent, used a SPC intermittently. Presents with coordination and processing deficits. Has some blurry vision and mils light headedness with mobility. Pt ambulated 66' with min A. Recommend HHPT.  Pt currently with functional limitations due to the deficits listed below (see PT Problem List). Pt will benefit from acute skilled PT to increase their independence and safety with mobility to allow discharge.           If plan is discharge home, recommend the following: A little help with walking and/or transfers;A little help with bathing/dressing/bathroom;Assist for transportation;Direct supervision/assist for financial management;Direct supervision/assist for medications management;Assistance with cooking/housework;Help with stairs or ramp for entrance   Can travel by private vehicle        Equipment Recommendations None recommended by PT  Recommendations for Other Services       Functional Status Assessment Patient has had a recent decline in their functional status and demonstrates the ability to make significant improvements in function in a reasonable and predictable amount of time.     Precautions / Restrictions Precautions Precautions: Fall Precaution Comments: SBP 130-150 Restrictions Weight Bearing Restrictions: No      Mobility  Bed Mobility               General bed mobility comments: OOB in recliner    Transfers Overall transfer level: Needs assistance Equipment used: Rolling walker (2 wheels) Transfers: Sit to/from  Stand Sit to Stand: Min assist           General transfer comment: to power up and steady, pt pulling up on RW. Min A with safety with stand to sit    Ambulation/Gait Ambulation/Gait assistance: Min assist, Contact guard assist Gait Distance (Feet): 70 Feet Assistive device: Rolling walker (2 wheels), IV Pole Gait Pattern/deviations: Step-through pattern, Trunk flexed Gait velocity: decreased Gait velocity interpretation: 1.31 - 2.62 ft/sec, indicative of limited community ambulator   General Gait Details: pt staying close to objects on L side. Pt had some difficulty navigating with RW. Did better with unilateral HHA as well as with IV pole on L side. Instructed her to use cane on L side when she gets home  Stairs            Wheelchair Mobility     Tilt Bed    Modified Rankin (Stroke Patients Only) Modified Rankin (Stroke Patients Only) Pre-Morbid Rankin Score: No symptoms Modified Rankin: Moderate disability     Balance Overall balance assessment: Needs assistance Sitting-balance support: No upper extremity supported, Feet supported Sitting balance-Leahy Scale: Fair     Standing balance support: Bilateral upper extremity supported, During functional activity, No upper extremity supported Standing balance-Leahy Scale: Poor Standing balance comment: relies on external support                             Pertinent Vitals/Pain Pain Assessment Pain Assessment: No/denies pain    Home Living Family/patient expects to be discharged to:: Private residence Living Arrangements: Spouse/significant other Available Help at Discharge: Family;Available 24 hours/day Type of Home: House Home Access: Stairs to enter Entrance  Stairs-Rails: None Entrance Stairs-Number of Steps: 1   Home Layout: One level Home Equipment: Cane - single point;Shower seat      Prior Function Prior Level of Function : Independent/Modified Independent             Mobility  Comments: independent, using cane as needed ADLs Comments: independent ADls, light IADls and med mgmt (she reports not driving recently)     Extremity/Trunk Assessment   Upper Extremity Assessment Upper Extremity Assessment: Defer to OT evaluation RUE Deficits / Details: some decreased sesnation and coordination, grossly  3+/5 RUE Sensation: decreased light touch RUE Coordination: decreased fine motor;decreased gross motor    Lower Extremity Assessment Lower Extremity Assessment: Generalized weakness       Communication   Communication Communication: Difficulty communicating thoughts/reduced clarity of speech (some expressive difficulties at times and slurring) Cueing Techniques: Verbal cues;Tactile cues  Cognition Arousal: Alert Behavior During Therapy: WFL for tasks assessed/performed Overall Cognitive Status: Impaired/Different from baseline Area of Impairment: Attention, Memory, Following commands, Safety/judgement, Awareness, Problem solving                   Current Attention Level: Sustained Memory: Decreased short-term memory Following Commands: Follows one step commands consistently, Follows one step commands with increased time, Follows multi-step commands inconsistently Safety/Judgement: Decreased awareness of safety, Decreased awareness of deficits Awareness: Emergent Problem Solving: Slow processing, Difficulty sequencing, Requires verbal cues General Comments: patient with some decreased problem solving and attention, requires redirection at times, somewhat internally distracted        General Comments General comments (skin integrity, edema, etc.): SBP 146 to 130 with sit to stand with OT, pt reports mild light headedness. SBP back up to 144 after ambulation    Exercises     Assessment/Plan    PT Assessment Patient needs continued PT services  PT Problem List Decreased strength;Decreased activity tolerance;Decreased balance;Decreased  mobility;Decreased coordination;Decreased cognition;Decreased knowledge of use of DME;Decreased safety awareness;Decreased knowledge of precautions       PT Treatment Interventions DME instruction;Gait training;Functional mobility training;Therapeutic activities;Therapeutic exercise;Balance training;Neuromuscular re-education;Cognitive remediation;Patient/family education    PT Goals (Current goals can be found in the Care Plan section)  Acute Rehab PT Goals Patient Stated Goal: return home PT Goal Formulation: With patient Time For Goal Achievement: 05/31/23 Potential to Achieve Goals: Good    Frequency Min 1X/week     Co-evaluation               AM-PAC PT "6 Clicks" Mobility  Outcome Measure Help needed turning from your back to your side while in a flat bed without using bedrails?: None Help needed moving from lying on your back to sitting on the side of a flat bed without using bedrails?: A Little Help needed moving to and from a bed to a chair (including a wheelchair)?: A Little Help needed standing up from a chair using your arms (e.g., wheelchair or bedside chair)?: A Little Help needed to walk in hospital room?: A Little Help needed climbing 3-5 steps with a railing? : A Little 6 Click Score: 19    End of Session Equipment Utilized During Treatment: Gait belt Activity Tolerance: Patient tolerated treatment well Patient left: in chair;with call bell/phone within reach;with chair alarm set Nurse Communication: Mobility status PT Visit Diagnosis: Unsteadiness on feet (R26.81);Difficulty in walking, not elsewhere classified (R26.2)    Time: 7829-5621 PT Time Calculation (min) (ACUTE ONLY): 13 min   Charges:   PT Evaluation $PT Eval Moderate Complexity: 1 Mod  PT General Charges $$ ACUTE PT VISIT: 1 Visit         Lyanne Co, PT  Acute Rehab Services Secure chat preferred Office [phone]   Elyse Hsu 05/17/2023, 12:32 PM

## 2023-05-17 NOTE — Progress Notes (Addendum)
STROKE TEAM PROGRESS NOTE   BRIEF HPI [name] is a 89 y.o. female with history of  hypertension, coronary artery disease, hyperglycemia, diabetes, who presents with dizziness that started yesterday.  She states that she mainly just feels off balance, but also notes she was having some difficulty coordinating her right side.  Due to this not getting better today, she is all care in emergency department where she was seen to have a right cerebellar hemorrhage.  She has been admitted for further management.  She was very hypertensive in the emergency department and started on a Cleviprex drip.   SIGNIFICANT HOSPITAL EVENTS 8/28-transferred to [facility] for ICU care  INTERIM HISTORY/SUBJECTIVE No family at the bedside. May have a slight dysarthria but no other focal neurological deficits. Increase BP medications to continue titrating down cleviprex. PT/OT currently recommending home health.  Currently on hypertonic saline at 76ml/hr. serum sodium this morning is 143  OBJECTIVE  CBC    Component Value Date/Time   WBC 5.5 05/16/2023 1727   RBC 3.52 (L) 05/16/2023 1727   HGB 10.8 (L) 05/16/2023 1727   HGB 11.3 02/27/2023 1400   HCT 33.5 (L) 05/16/2023 1727   HCT 35.8 02/27/2023 1400   PLT 170 05/16/2023 1727   PLT 301 02/27/2023 1400   MCV 95.2 05/16/2023 1727   MCV 92 02/27/2023 1400   MCH 30.7 05/16/2023 1727   MCHC 32.2 05/16/2023 1727   RDW 14.2 05/16/2023 1727   RDW 13.2 02/27/2023 1400   LYMPHSABS 0.9 05/16/2023 1727   LYMPHSABS 1.0 09/05/2022 1144   MONOABS 0.6 05/16/2023 1727   EOSABS 0.1 05/16/2023 1727   EOSABS 0.2 09/05/2022 1144   BASOSABS 0.0 05/16/2023 1727   BASOSABS 0.1 09/05/2022 1144    BMET    Component Value Date/Time   NA 143 05/17/2023 0717   NA 136 02/27/2023 1400   K 3.1 (L) 05/17/2023 0717   CL 107 05/17/2023 0717   CO2 22 05/17/2023 0717   GLUCOSE 215 (H) 05/17/2023 0717   BUN 33 (H) 05/17/2023 0717   BUN 29 (H) 02/27/2023 1400    CREATININE 1.87 (H) 05/17/2023 0717   CREATININE 1.70 (H) 02/01/2023 1356   CALCIUM 8.2 (L) 05/17/2023 0717   EGFR 26 (L) 02/27/2023 1400   GFRNONAA 26 (L) 05/17/2023 0717   GFRNONAA 40 (L) 06/29/2020 0916    IMAGING past 24 hours CT HEAD WO CONTRAST ( )  Result Date: 05/17/2023 CLINICAL DATA:  89 year old female with right cerebellar hemorrhage and edema. EXAM: CT HEAD WITHOUT CONTRAST TECHNIQUE: Contiguous axial images were obtained from the base of the skull through the vertex without intravenous contrast. RADIATION DOSE REDUCTION: This exam was performed according to the departmental dose-optimization program which includes automated exposure control, adjustment of the mA and/or kV according to patient size and/or use of iterative reconstruction technique. COMPARISON:  Head CT, CTA head and neck yesterday. Brain MRI 04/25/2023. FINDINGS: Brain: Hyperdense hemorrhage in the right cerebellum is rounded, 24 by 23 x 13 mm (AP by transverse by CC) for an estimated blood volume of 3-4 mL (versus 3 mL yesterday when measured with the same technique). Surrounding edema. Fourth ventricle and basilar cisterns remain patent. No IVH or extra-axial extension of blood is identified. Lateral and 3rd ventricle size appears stable from previous years. There is chronic nonspecific white matter hypodensity but no transependymal edema suspected. No midline shift. No new intracranial hemorrhage. No new cortically based infarct. Vascular: Extensive Calcified atherosclerosis at the skull  base. Skull: Intact.  No acute osseous abnormality identified. Sinuses/Orbits: Chronic right sphenoid sinusitis with mucoperiosteal thickening. Stable sinus and mastoid aeration. Other: No acute orbit or scalp soft tissue finding. IMPRESSION: 1. Right cerebellar hemorrhage is stable to perhaps 1 mL larger since yesterday (3-4 mL overall). Surrounding edema, but no significant posterior fossa mass effect, and no other complicating  features. 2. No new intracranial abnormality. Electronically Signed   By: Odessa Fleming M.D.   On: 05/17/2023 06:09   CT ANGIO HEAD NECK W WO CM  Result Date: 05/16/2023 CLINICAL DATA:  Intracranial hemorrhage EXAM: CT ANGIOGRAPHY HEAD AND NECK WITH AND WITHOUT CONTRAST TECHNIQUE: Multidetector CT imaging of the head and neck was performed using the standard protocol during bolus administration of intravenous contrast. Multiplanar CT image reconstructions and MIPs were obtained to evaluate the vascular anatomy. Carotid stenosis measurements (when applicable) are obtained utilizing NASCET criteria, using the distal internal carotid diameter as the denominator. RADIATION DOSE REDUCTION: This exam was performed according to the departmental dose-optimization program which includes automated exposure control, adjustment of the mA and/or kV according to patient size and/or use of iterative reconstruction technique. CONTRAST:  50mL OMNIPAQUE IOHEXOL 350 MG/ML SOLN COMPARISON:  None Available. FINDINGS: CT HEAD FINDINGS Brain: In intraparenchymal hematoma within the right cerebellum measuring 2.1 x 2.0 x 1.0 cm (2 mL). Mild surrounding edema. No mass effect. The size and configuration of the ventricles and extra-axial CSF spaces are normal. There is hypoattenuation of the periventricular white matter, most commonly indicating chronic ischemic microangiopathy. Skull: The visualized skull base, calvarium and extracranial soft tissues are normal. Sinuses/Orbits: No fluid levels or advanced mucosal thickening of the visualized paranasal sinuses. No mastoid or middle ear effusion. The orbits are normal. CTA NECK FINDINGS SKELETON: There is no bony spinal canal stenosis. No lytic or blastic lesion. OTHER NECK: Normal pharynx, larynx and major salivary glands. No cervical lymphadenopathy. 1.8 cm nodule in the left lobe of the thyroid gland. UPPER CHEST: No pneumothorax or pleural effusion. No nodules or masses. AORTIC ARCH: There  is no calcific atherosclerosis of the aortic arch. Conventional 3 vessel aortic branching pattern. RIGHT CAROTID SYSTEM: Normal without aneurysm, dissection or stenosis. LEFT CAROTID SYSTEM: Normal without aneurysm, dissection or stenosis. VERTEBRAL ARTERIES: Left dominant configuration.There is no dissection, occlusion or flow-limiting stenosis to the skull base (V1-V3 segments). CTA HEAD FINDINGS POSTERIOR CIRCULATION: --Vertebral arteries: Normal V4 segments. --Inferior cerebellar arteries: Normal. --Basilar artery: Normal. --Superior cerebellar arteries: Normal. --Posterior cerebral arteries (PCA): Normal. ANTERIOR CIRCULATION: --Intracranial internal carotid arteries: Normal. --Anterior cerebral arteries (ACA): Normal. Both A1 segments are present. Patent anterior communicating artery (a-comm). --Middle cerebral arteries (MCA): Normal. VENOUS SINUSES: As permitted by contrast timing, patent. ANATOMIC VARIANTS: None Review of the MIP images confirms the above findings. IMPRESSION: 1. Intraparenchymal hematoma within the right cerebellum measuring 2.1 x 2.0 x 1.0 cm with mild surrounding edema. No mass effect. 2. No emergent large vessel occlusion or high-grade stenosis of the intracranial arteries. 3. No aneurysm or vascular malformation. 4. Incidental left thyroid nodule measuring 1.8 cm. Recommend non-emergent thyroid ultrasound if clinically warranted given patient age. Reference: J Am Coll Radiol. 2015 Feb;12(2): 143-50 Critical Value/emergent results were called by telephone at the time of interpretation on 05/16/2023 at 7:43 pm to provider Benjiman Core , who verbally acknowledged these results. Electronically Signed   By: Deatra Robinson M.D.   On: 05/16/2023 19:43   DG Chest Portable 1 View  Result Date: 05/16/2023 CLINICAL DATA:  Weakness EXAM: PORTABLE  CHEST 1 VIEW COMPARISON:  X-ray 09/16/2022 FINDINGS: Mildly enlarged cardiopericardial silhouette with a calcified aorta. Chronic lung changes with  some interstitial components. No consolidation, pneumothorax or edema. Overlapping cardiac leads. Osteopenia and degenerative change. IMPRESSION: Enlarged cardiopericardial silhouette. Chronic lung changes. Hyperinflation. Electronically Signed   By: Karen Kays M.D.   On: 05/16/2023 16:54    Vitals:   05/17/23 1345 05/17/23 1400 05/17/23 1415 05/17/23 1430  BP: (!) 142/74 (!) 151/85 130/72 (!) 140/72  Pulse: 72 74 72 72  Resp: 12 14 (!) 9 11  Temp:      TempSrc:      SpO2: 93% 95% 95% 94%  Weight:         PHYSICAL EXAM General:  Alert, well-nourished, well-developed patient in no acute distress Psych:  Mood and affect appropriate for situation CV: Regular rate and rhythm on monitor Respiratory:  Regular, unlabored respirations on room air GI: Abdomen soft and nontender  NEURO:  Mental Status: AA&Ox3, patient is able to give clear and coherent history Speech/Language: speech is mildly dysarthric. no aphasia.  Naming, repetition, fluency, and comprehension intact.  Cranial Nerves:  II: PERRL. Visual fields full.  III, IV, VI: EOMI. Eyelids elevate symmetrically.  V: Sensation is intact to light touch and symmetrical to face.  VII: Face is symmetrical resting and smiling VIII: hearing intact to voice. IX, X: Palate elevates symmetrically. Phonation is normal.  KG:URKYHCWC shrug 5/5. XII: tongue is midline without fasciculations. Motor: 5/5 strength to all muscle groups tested.  Tone: is normal and bulk is normal Sensation- Intact to light touch bilaterally. Extinction absent to light touch to DSS.   Coordination: FTN intact bilaterally, HKS: no ataxia in BLE.No drift.  Gait- deferred   ASSESSMENT/PLAN  Intraparenchymal Hemorrhage:  Right cerebellar hemorrhage Etiology:  Hypertensive  CTA head & neck Intraparenchymal hematoma within the right cerebellum measuring 2.1 x 2.0 x 1.0 cm with mild surrounding edema. No mass effect. 2. No emergent large vessel occlusion or  high-grade stenosis of the intracranial arteries. No aneurysm or vascular malformation. Incidental left thyroid nodule measuring 1.8 cm. Recommend non-emergent thyroid ultrasound if clinically warranted given patient age. Repeat CT Head - Right cerebellar hemorrhage is stable to perhaps 1 mL larger since yesterday (3-4 mL overall). Surrounding edema, but no significant posterior fossa mass effect, and no other complicating features. MRI  Pending  2D Echo Pending  LDL 65 HgbA1c 7.5 VTE prophylaxis - SCDs No antithrombotic prior to admission, now on No antithrombotic Therapy recommendations:  Home Health PT and Home Health OT Disposition:  pending  Cerebral Edema with Brain Compression Hypertonic saline at 69ml/hr Q6 sodium checks  Congestive Heart Failure  Coronary Artery Disease Hypertension Home meds: Coreg, Lasix, hydralazine, spironolactone Daily Lasix on hold, one-time dose of 20 mg ordered Stable Blood Pressure Goal: SBP between 130-150 for 24 hours and then less than 160  IV Cleviprex Home meds resumed Labetalol 20 mg every 2hr as needed  Hyperlipidemia Home meds: Crestor, resumed in hospital LDL 65, goal < 70 Continue statin at discharge  Diabetes type II Uncontrolled Home meds: Insulin HgbA1c 7.5, goal < 7.0 CBGs SSI Recommend close follow-up with PCP for better DM control   Hospital day # 1  Patient seen and examined by NP/APP with MD. MD to update note as needed.   Elmer Picker, DNP, FNP-BC [facility] Pager: [phone]  STROKE MD NOTE :  I have personally obtained history,examined this patient, reviewed notes, independently viewed imaging studies, participated in  medical decision making and plan of care.ROS completed by me personally and pertinent positives fully documented  I have made any additions or clarifications directly to the above note. Agree with note above.  Patient presented with right cerebellar hypertensive parenchymal  hemorrhage with mild cytotoxic edema with no hydrocephalus.  Continue close neurological observation and strict blood pressure control with systolic goal between 130-150 for the first 24 hours and then below 160.  Continue hypertonic saline with serum sodium goal 150-155.  Check MRI scan.  Mobilize out of bed.  Therapy consults.  No family available at the bedside for discussion.This patient is critically ill and at significant risk of neurological worsening, death and care requires constant monitoring of vital signs, hemodynamics,respiratory and cardiac monitoring, extensive review of multiple databases, frequent neurological assessment, discussion with family, other specialists and medical decision making of high complexity.I have made any additions or clarifications directly to the above note.This critical care time does not reflect procedure time, or teaching time or supervisory time of PA/NP/Med Resident etc but could involve care discussion time.  I spent 30 minutes of neurocritical care time  in the care of  this patient.      Delia Heady, MD Medical Director [facility] Pager: [phone] 05/17/2023 3:13 PM   To contact Stroke Continuity provider, please refer to [email or web address]. After hours, contact General Neurology

## 2023-05-17 NOTE — H&P (Signed)
Neurology H&P  CC: Dizziness  History is obtained from: Patient, chart review  HPI: Emily Jennings is a 89 y.o. female with a history of hypertension, coronary artery disease, hyperglycemia, diabetes, who presents with dizziness that started yesterday.  She states that she mainly just feels off balance, but also notes she was having some difficulty coordinating her right side.  Due to this not getting better today, she is all care in emergency department where she was seen to have a right cerebellar hemorrhage.  She has been admitted for further management.  She was very hypertensive in the emergency department and started on a Cleviprex drip.   LKW: Sunday 4 PM tpa given?: No, ICH IR Thrombectomy? No, ICH NIHSS: 3(one for dysarthria and two for ataxia) ICH score: 2  Past Medical History:  Diagnosis Date   Allergy    Anxiety    ANXIETY DISORDER, GENERALIZED 07/15/2007   Qualifier: Diagnosis of  By: Chipper Herb     Arthritis    Cancer [facility]) 2009   breast, carcinoma in situ left   Carcinoma in situ of breast 05/21/2008   Qualifier: Diagnosis of  By: [name] MD, Margaret  Diagnosed in 2009, completed 5 year course of tamoxifen, no evidence of recurrence    Carotid stenosis    11/16/2005  mild plaque formation and stenosis proximal right ECA   Cataract    Complication of anesthesia    Coronary artery disease    cardiac catheterization on 03/20/2006  LAD mid 40% stenosis, left circumflex mild 40% stenosis, RCA mid-vessel 40% to 50% lesion   EF 60%   Diabetes mellitus    GERD (gastroesophageal reflux disease)    Hernia, inguinal    left   Hyperglycemia    Hypertension    Insomnia 11/16/2011   Low blood potassium    Non-insulin dependent type 2 diabetes mellitus (HCC)    Osteoporosis    Shortness of breath    2D Echocardiogram 01/26/2009   EF of greater than 55%, mild MR, mild TR, normal ventricular function   Thickened endometrium 10/26/2017   Noted by gyne in 2017, missed 6 month  follow up, referred in 09/2017   Ventricular tachycardia, non-sustained (HCC)    developed during stress test 02/08/2006, spontaneously aborted, mild reversible apical defect     Family History  Problem Relation Age of Onset   Hypertension Mother    Hyperlipidemia Mother    Stroke Mother    Urticaria Mother    Cancer Father        pancreatic   Colon cancer Father    Heart disease Brother 40       bypass   Heart disease Brother 61       bypass   Arthritis Other    Asthma Other    Diabetes Other    Colon cancer Paternal Aunt    Esophageal cancer Neg Hx    Stomach cancer Neg Hx    Rectal cancer Neg Hx      Social History:  reports that she has never smoked. She has never been exposed to tobacco smoke. She has never used smokeless tobacco. She reports that she does not drink alcohol and does not use drugs.   Prior to Admission medications   Medication Sig Start Date End Date Taking? Authorizing Provider  calcitRIOL (ROCALTROL) 0.25 MCG capsule Take 0.25 mcg by mouth 3 (three) times a week.   Yes [provider]  carvedilol (COREG) 12.5 MG tablet Take two tablets  by mouth two times daily 04/03/23  Yes [name], MD  furosemide (LASIX) 40 MG tablet Take 1 tablet (40 mg total) by mouth 2 (two) times daily. Patient taking differently: Take 40 mg by mouth daily. 10/09/22  Yes Sharlene Dory, NP  hydrALAZINE (APRESOLINE) 25 MG tablet Take one tablet by mouth two times daily Patient taking differently: Take 25 mg by mouth 3 (three) times daily. Take one tablet by mouth two times daily 04/24/23  Yes [name], MD  insulin degludec (TRESIBA FLEXTOUCH) 100 UNIT/ML FlexTouch Pen Inject 5 Units into the skin daily. 05/01/23  Yes Shamleffer, Konrad Dolores, MD  insulin lispro (HUMALOG KWIKPEN) 100 UNIT/ML KwikPen Inject 5 Units into the skin 3 (three) times daily. Max daily 30 units 05/01/23  Yes Shamleffer, Konrad Dolores, MD  mupirocin ointment (BACTROBAN) 2 %  Apply 1 Application topically 2 (two) times daily. 05/01/23  Yes [name], MD  rosuvastatin (CRESTOR) 5 MG tablet TAKE 1 TABLET (5 MG TOTAL) BY MOUTH DAILY. 01/15/23  Yes [name], MD  spironolactone (ALDACTONE) 50 MG tablet Take 1 tablet (50 mg total) by mouth daily. 05/13/23  Yes Shamleffer, Konrad Dolores, MD  blood glucose meter kit and supplies KIT One touch Ultra. Use three times daily as directed. (FOR ICD E11.65) 10/27/20   Roma Kayser, MD  Continuous Blood Gluc Sensor (DEXCOM G7 SENSOR) MISC 1 Device by Does not apply route as directed. 12/25/22   Shamleffer, Konrad Dolores, MD  glucose blood (ONETOUCH ULTRA) test strip USE TO TEST 4 TIMES A DAY 01/09/23   Roma Kayser, MD  Insulin Pen Needle 32G X 4 MM MISC 1 each by Does not apply route in the morning and at bedtime. 12/25/22   Shamleffer, Konrad Dolores, MD  OneTouch Delica Lancets 33G MISC 1 each by Does not apply route 3 (three) times daily. Use to check glucose three times daily 10/03/22   Roma Kayser, MD     Exam: Current vital signs: BP (!) 140/104   Pulse 82   Temp 98.8 F (37.1 C) (Oral)   Resp 15   Wt 55.4 kg   SpO2 94%   BMI 23.85 kg/m    Physical Exam  Constitutional: Appears well-developed and well-nourished.  Psych: Affect appropriate to situation Eyes: No scleral injection HENT: No OP obstrucion Head: Normocephalic.  Cardiovascular: Normal rate and regular rhythm.  Respiratory: Effort normal and breath sounds normal to anterior ascultation GI: Soft.  No distension. There is no tenderness.  Skin: WDI  Neuro: Mental Status: Patient is awake, alert, oriented to person, place, month, year, and situation. Patient is able to give a clear and coherent history. No signs of aphasia or neglect Cranial Nerves: II: Visual Fields are full. Pupils are equal, round, and reactive to light.   III,IV, VI: EOMI without ptosis or diplopia.  V: Facial sensation is symmetric to  temperature VII: Facial movement is symmetric.  VIII: hearing is intact to voice X: Uvula is midline and palate elevates symmetrically XI: Shoulder shrug is symmetric. XII: tongue is midline without atrophy or fasciculations.  Motor: Tone is normal. Bulk is normal. 5/5 strength was present in all four extremities.  Sensory: Sensation is symmetric to light touch and temperature in the arms and legs. Deep Tendon Reflexes: 2+ and symmetric in the biceps and patellae.  Plantars: Toes are downgoing bilaterally.  Cerebellar: FNF with clear ataxia in the right upper extremity, in the right lower extremity she has very  subtle difficulty with heel-knee-shin, but it does look different than her left.  Her left side appears intact.   I have reviewed labs in epic and the pertinent results are: Results for orders placed or performed during the hospital encounter of 05/16/23 (from the past 24 hour(s))  CBC with Differential     Status: Abnormal   Collection Time: 05/16/23  5:27 PM  Result Value Ref Range   WBC 5.5 4.0 - 10.5 K/uL   RBC 3.52 (L) 3.87 - 5.11 MIL/uL   Hemoglobin 10.8 (L) 12.0 - 15.0 g/dL   HCT 16.1 (L) 09.6 - 04.5 %   MCV 95.2 80.0 - 100.0 fL   MCH 30.7 26.0 - 34.0 pg   MCHC 32.2 30.0 - 36.0 g/dL   RDW 40.9 81.1 - 91.4 %   Platelets 170 150 - 400 K/uL   nRBC 0.0 0.0 - 0.2 %   Neutrophils Relative % 69 %   Neutro Abs 3.8 1.7 - 7.7 K/uL   Lymphocytes Relative 17 %   Lymphs Abs 0.9 0.7 - 4.0 K/uL   Monocytes Relative 11 %   Monocytes Absolute 0.6 0.1 - 1.0 K/uL   Eosinophils Relative 2 %   Eosinophils Absolute 0.1 0.0 - 0.5 K/uL   Basophils Relative 1 %   Basophils Absolute 0.0 0.0 - 0.1 K/uL   Immature Granulocytes 0 %   Abs Immature Granulocytes 0.01 0.00 - 0.07 K/uL  Comprehensive metabolic panel     Status: Abnormal   Collection Time: 05/16/23  5:27 PM  Result Value Ref Range   Sodium 133 (L) 135 - 145 mmol/L   Potassium 3.3 (L) 3.5 - 5.1 mmol/L   Chloride 97 (L) 98  - 111 mmol/L   CO2 27 22 - 32 mmol/L   Glucose, Bld 188 (H) 70 - 99 mg/dL   BUN 31 (H) 8 - 23 mg/dL   Creatinine, Ser 7.82 (H) 0.44 - 1.00 mg/dL   Calcium 8.1 (L) 8.9 - 10.3 mg/dL   Total Protein 4.5 (L) 6.5 - 8.1 g/dL   Albumin 2.6 (L) 3.5 - 5.0 g/dL   AST 22 15 - 41 U/L   ALT 15 0 - 44 U/L   Alkaline Phosphatase 64 38 - 126 U/L   Total Bilirubin 0.9 0.3 - 1.2 mg/dL   GFR, Estimated 30 (L) >60 mL/min   Anion gap 9 5 - 15  Urinalysis, w/ Reflex to Culture (Infection Suspected) -Urine, Unspecified Source     Status: Abnormal   Collection Time: 05/16/23  7:45 PM  Result Value Ref Range   Specimen Source URINE, UNSPE    Color, Urine STRAW (A) YELLOW   APPearance CLEAR CLEAR   Specific Gravity, Urine 1.007 1.005 - 1.030   pH 7.0 5.0 - 8.0   Glucose, UA 150 (A) NEGATIVE mg/dL   Hgb urine dipstick NEGATIVE NEGATIVE   Bilirubin Urine NEGATIVE NEGATIVE   Ketones, ur 5 (A) NEGATIVE mg/dL   Protein, ur >=956 (A) NEGATIVE mg/dL   Nitrite NEGATIVE NEGATIVE   Leukocytes,Ua NEGATIVE NEGATIVE   RBC / HPF 0-5 0 - 5 RBC/hpf   WBC, UA 0-5 0 - 5 WBC/hpf   Bacteria, UA RARE (A) NONE SEEN   Squamous Epithelial / HPF 0-5 0 - 5 /HPF  Sodium     Status: Abnormal   Collection Time: 05/16/23  7:56 PM  Result Value Ref Range   Sodium 134 (L) 135 - 145 mmol/L  MRSA Next Gen by PCR, Nasal  Status: None   Collection Time: 05/16/23  9:37 PM   Specimen: Nasal Mucosa; Nasal Swab  Result Value Ref Range   MRSA by PCR Next Gen NOT DETECTED NOT DETECTED  Glucose, capillary     Status: Abnormal   Collection Time: 05/17/23  3:56 AM  Result Value Ref Range   Glucose-Capillary 273 (H) 70 - 99 mg/dL   *Note: Due to a large number of results and/or encounters for the requested time period, some results have not been displayed. A complete set of results can be found in Results Review.     I have reviewed the images obtained: CT head-right cerebellar hemorrhage  Primary Diagnosis:  Nontraumatic  intracerebral hemorrhage in cerebellum  Secondary Diagnosis: Cerebral edema, Essential (primary) hypertension, Hypertension Emergency (SBP > 180 or DBP > 120 & end organ damage), Type 2 diabetes mellitus with hyperglycemia , CKD Stage 3 (GFR 30-59), and Hyponatremia, hypokalemia   Impression: 89 year old female with acute cerebellar hemorrhage, most likely hypertensive in etiology.  She has been started on hypertonic saline due to the location with some surrounding edema.  Plan: 1) Admit to ICU 2) no antiplatelets or anticoagulants 3) blood pressure control with goal systolic 130 - 150 4) Frequent neuro checks 5) If symptoms worsen or there is decreased mental status, repeat stat head CT 6) PT,OT,ST 7) would continue hypertonic saline for now, if she does not develop worsening edema, could consider discontinuing this. 8) repeat scan in the morning   This patient is critically ill and at significant risk of neurological worsening, death and care requires constant monitoring of vital signs, hemodynamics,respiratory and cardiac monitoring, neurological assessment, discussion with family, other specialists and medical decision making of high complexity. I spent 45 minutes of neurocritical care time  in the care of  this patient. This was time spent independent of any time provided by nurse practitioner or PA.  Ritta Slot, MD [facility] [phone]  If 7pm- 7am, please page neurology on call as listed in AMION.

## 2023-05-17 NOTE — Progress Notes (Signed)
PT Cancellation Note  Patient Details Name: Emily Jennings MRN: [id number] DOB: 19-May-1934   Cancelled Treatment:    Reason Eval/Treat Not Completed: Active bedrest order. Will follow when appropriate.   Lyanne Co, PT  Acute Rehab Services Secure chat preferred Office [phone]    Emily Jennings 05/17/2023, 9:04 AM

## 2023-05-17 NOTE — Progress Notes (Addendum)
OT Cancellation Note  Patient Details Name: QUENTASIA STMARIE MRN: [id number] DOB: Dec 04, 1933   Cancelled Treatment:    Reason Eval/Treat Not Completed: Active bedrest order. Orders received, noted orders to start at 2304 today (8/29).  Will follow.  Barry Brunner, OT Acute Rehabilitation Services Office [phone]   Chancy Milroy 05/17/2023, 8:46 AM

## 2023-05-17 NOTE — Progress Notes (Signed)
   05/17/23 2028  BiPAP/CPAP/SIPAP  BiPAP/CPAP/SIPAP Pt Type Adult  Reason BIPAP/CPAP not in use Non-compliant   Pt refused BiPAP use at this time. RN made aware. Pt is tolerating 4LNC at this time. Will continue to monitor.

## 2023-05-17 NOTE — Telephone Encounter (Signed)
Lmtcb.

## 2023-05-17 NOTE — Progress Notes (Signed)
AM head CT complete.

## 2023-05-18 ENCOUNTER — Inpatient Hospital Stay (HOSPITAL_COMMUNITY): Payer: Medicare Other

## 2023-05-18 DIAGNOSIS — R061 Stridor: Secondary | ICD-10-CM | POA: Diagnosis not present

## 2023-05-18 DIAGNOSIS — I614 Nontraumatic intracerebral hemorrhage in cerebellum: Secondary | ICD-10-CM | POA: Diagnosis not present

## 2023-05-18 DIAGNOSIS — I160 Hypertensive urgency: Secondary | ICD-10-CM | POA: Diagnosis not present

## 2023-05-18 LAB — BASIC METABOLIC PANEL
Anion gap: 12 (ref 5–15)
BUN: 36 mg/dL — ABNORMAL HIGH (ref 8–23)
CO2: 20 mmol/L — ABNORMAL LOW (ref 22–32)
Calcium: 8.3 mg/dL — ABNORMAL LOW (ref 8.9–10.3)
Chloride: 117 mmol/L — ABNORMAL HIGH (ref 98–111)
Creatinine, Ser: 2.16 mg/dL — ABNORMAL HIGH (ref 0.44–1.00)
GFR, Estimated: 22 mL/min — ABNORMAL LOW (ref >60)
Glucose, Bld: 158 mg/dL — ABNORMAL HIGH (ref 70–99)
Potassium: 3.7 mmol/L (ref 3.5–5.1)
Sodium: 149 mmol/L — ABNORMAL HIGH (ref 135–145)

## 2023-05-18 LAB — TRIGLYCERIDES: Triglycerides: 66 mg/dL (ref <150)

## 2023-05-18 LAB — MAGNESIUM: Magnesium: 2.3 mg/dL (ref 1.7–2.4)

## 2023-05-18 LAB — GLUCOSE, CAPILLARY
Glucose-Capillary: 112 mg/dL — ABNORMAL HIGH (ref 70–99)
Glucose-Capillary: 121 mg/dL — ABNORMAL HIGH (ref 70–99)
Glucose-Capillary: 156 mg/dL — ABNORMAL HIGH (ref 70–99)
Glucose-Capillary: 167 mg/dL — ABNORMAL HIGH (ref 70–99)
Glucose-Capillary: 286 mg/dL — ABNORMAL HIGH (ref 70–99)
Glucose-Capillary: 294 mg/dL — ABNORMAL HIGH (ref 70–99)

## 2023-05-18 LAB — SODIUM: Sodium: 150 mmol/L — ABNORMAL HIGH (ref 135–145)

## 2023-05-18 MED ORDER — POTASSIUM CHLORIDE CRYS ER 20 MEQ PO TBCR
20.0000 meq | EXTENDED_RELEASE_TABLET | Freq: Once | ORAL | Status: AC
  Administered 2023-05-18: 20 meq via ORAL
  Filled 2023-05-18: qty 1

## 2023-05-18 MED ORDER — HYDRALAZINE HCL 50 MG PO TABS
50.0000 mg | ORAL_TABLET | Freq: Once | ORAL | Status: AC
  Administered 2023-05-18: 50 mg via ORAL
  Filled 2023-05-18: qty 1

## 2023-05-18 MED ORDER — IPRATROPIUM-ALBUTEROL 0.5-2.5 (3) MG/3ML IN SOLN
3.0000 mL | Freq: Four times a day (QID) | RESPIRATORY_TRACT | Status: AC
  Administered 2023-05-18 – 2023-05-20 (×6): 3 mL via RESPIRATORY_TRACT
  Filled 2023-05-18 (×5): qty 3

## 2023-05-18 MED ORDER — FUROSEMIDE 10 MG/ML IJ SOLN
60.0000 mg | Freq: Once | INTRAMUSCULAR | Status: AC
  Administered 2023-05-18: 60 mg via INTRAVENOUS
  Filled 2023-05-18: qty 6

## 2023-05-18 MED ORDER — HYDRALAZINE HCL 20 MG/ML IJ SOLN
10.0000 mg | INTRAMUSCULAR | Status: DC | PRN
  Administered 2023-05-18 – 2023-05-24 (×10): 10 mg via INTRAVENOUS
  Filled 2023-05-18 (×11): qty 1

## 2023-05-18 MED ORDER — HYDRALAZINE HCL 50 MG PO TABS
100.0000 mg | ORAL_TABLET | Freq: Three times a day (TID) | ORAL | Status: DC
  Administered 2023-05-18 – 2023-05-28 (×30): 100 mg via ORAL
  Filled 2023-05-18 (×30): qty 2

## 2023-05-18 MED ORDER — HEPARIN SODIUM (PORCINE) 5000 UNIT/ML IJ SOLN
5000.0000 [IU] | Freq: Three times a day (TID) | INTRAMUSCULAR | Status: DC
  Administered 2023-05-18 – 2023-05-28 (×30): 5000 [IU] via SUBCUTANEOUS
  Filled 2023-05-18 (×30): qty 1

## 2023-05-18 NOTE — Progress Notes (Signed)
Physical Therapy Treatment Patient Details Name: Emily Jennings MRN: [id number] DOB: 19-Jun-1934 Today's Date: 05/18/2023   History of Present Illness Pt is a 89 y/o female presenting on 8/28 with dizziness. CT found with nontraumatic intracerebral hemorrhage R cerebellum.  PMH includes: anxiety, breast cancer, carotid stenosis, DM, HTN, osteoporosis, insomnia.    PT Comments  Patient resting in bed at start of session and agreeable to mobilize with therapy. Pt required min assist to completed bed mobility and noted to have strong posterior lean with sit>stand and mod assist required to stabilize balance in standing. Patient initiated gait with IV pole in Lt UE and Rt support from therapist, pt reported urge to have BM and ambulated into bathroom. Pt rushing due to urge and poor balance with Lt lean requiring Mod-max assist to control lower to [facility]. Total assist for pericare with pt reliant on RW for bil UE support in standing from [facility]. Pt amb with RW from bathroom back to recliner and min-mod assist for use of walker with balance much improved with bil UE support. BP elevated at start, RN aware and adjusted medications to stay within parameters.  Continue to recommend 24/7 support, assist with all IADLs (meals, meds, and fiances, no driving) at dc and pt will need physical assist with transfers and gait using RW.     If plan is discharge home, recommend the following: A little help with walking and/or transfers;A little help with bathing/dressing/bathroom;Assist for transportation;Direct supervision/assist for financial management;Direct supervision/assist for medications management;Assistance with cooking/housework;Help with stairs or ramp for entrance   Can travel by private vehicle        Equipment Recommendations  None recommended by PT    Recommendations for Other Services       Precautions / Restrictions Precautions Precautions: Fall Precaution Comments: SBP 130-150 Restrictions Weight  Bearing Restrictions: No     Mobility  Bed Mobility Overal bed mobility: Needs Assistance Bed Mobility: Supine to Sit     Supine to sit: Min assist     General bed mobility comments: min assist to elevate trunk, reaching for UE support of bed rail and to scoot to EOB.    Transfers Overall transfer level: Needs assistance Equipment used: Rolling walker (2 wheels), None Transfers: Sit to/from Stand Sit to Stand: Min assist, Mod assist           General transfer comment: Min assist to rise and mod assist to steady on first stand due to significant posterior lean. Mod assist to guide turn and control lowering to sit on commode and in recliner.    Ambulation/Gait Ambulation/Gait assistance: Min assist, Mod assist Gait Distance (Feet): 10 Feet (2x10) Assistive device: Rolling walker (2 wheels), 1 person hand held assist, IV Pole Gait Pattern/deviations: Step-through pattern, Decreased step length - right, Decreased step length - left, Decreased stride length, Shuffle, Trunk flexed Gait velocity: decr     General Gait Details: pt holding IV with Lt UE and Rt UE and support/guarding on Rt side from therapist. pt amb several short steps and reports urge for BM, pt amb into bathroom with Min assist at start fading to Mod assist as pt rushing to get to toilet. Pt required Mod-max assist to control turn to [facility] as unable to make it to toilet due to Lt lean. Pt amb out of bathroom with RW and cues to maitnain safe position to walker, balance improved with bil UE support and pt required min-mod assist to guide forward gait and turnto sit in  recliner.   Stairs             Wheelchair Mobility     Tilt Bed    Modified Rankin (Stroke Patients Only)       Balance Overall balance assessment: Needs assistance Sitting-balance support: No upper extremity supported, Feet supported Sitting balance-Leahy Scale: Fair     Standing balance support: Bilateral upper extremity  supported, No upper extremity supported, During functional activity Standing balance-Leahy Scale: Poor Standing balance comment: relies on UE and external support                            Cognition Arousal: Alert Behavior During Therapy: Impulsive Overall Cognitive Status: Impaired/Different from baseline Area of Impairment: Orientation, Attention, Memory, Following commands, Safety/judgement, Awareness, Problem solving                 Orientation Level: Disoriented to, Time Current Attention Level: Sustained Memory: Decreased recall of precautions, Decreased short-term memory Following Commands: Follows one step commands consistently, Follows one step commands with increased time, Follows multi-step commands inconsistently Safety/Judgement: Decreased awareness of deficits, Decreased awareness of safety Awareness: Emergent Problem Solving: Slow processing, Difficulty sequencing, Requires verbal cues General Comments: patient more impulsive today and requires redirection for safety. pt required repeated cues to prompt her to answer questions.        Exercises      General Comments        Pertinent Vitals/Pain Pain Assessment Pain Assessment: No/denies pain    Home Living                          Prior Function            PT Goals (current goals can now be found in the care plan section) Acute Rehab PT Goals PT Goal Formulation: With patient Time For Goal Achievement: 05/31/23 Potential to Achieve Goals: Good Progress towards PT goals: Progressing toward goals    Frequency    Min 1X/week      PT Plan      Co-evaluation              AM-PAC PT "6 Clicks" Mobility   Outcome Measure  Help needed turning from your back to your side while in a flat bed without using bedrails?: A Little Help needed moving from lying on your back to sitting on the side of a flat bed without using bedrails?: A Little Help needed moving to and  from a bed to a chair (including a wheelchair)?: A Little Help needed standing up from a chair using your arms (e.g., wheelchair or bedside chair)?: A Lot Help needed to walk in hospital room?: A Lot Help needed climbing 3-5 steps with a railing? : A Lot 6 Click Score: 15    End of Session Equipment Utilized During Treatment: Gait belt Activity Tolerance: Patient tolerated treatment well Patient left: in chair;with call bell/phone within reach;with chair alarm set Nurse Communication: Mobility status PT Visit Diagnosis: Unsteadiness on feet (R26.81);Difficulty in walking, not elsewhere classified (R26.2)     Time: 1660-6301 PT Time Calculation (min) (ACUTE ONLY): 40 min  Charges:    $Gait Training: 8-22 mins $Therapeutic Activity: 23-37 mins PT General Charges $$ ACUTE PT VISIT: 1 Visit                     Wynn Maudlin, DPT Acute Rehabilitation Services Office [phone]  05/18/23 5:21 PM

## 2023-05-18 NOTE — Consult Note (Signed)
NAME:  Emily Jennings, MRN:  [id number], DOB:  [birth year], LOS: 2 ADMISSION DATE:  05/16/2023, CONSULTATION DATE:  05/17/23 REFERRING MD:  Pearlean Brownie - neuro , CHIEF COMPLAINT:  wheezing    History of Present Illness:  89 yo F PMH prior CVA HTN CAD DM CKD 3b who was admitted to neuro service 8/28 after presenting to ED w dizziness. Found to have associated poor R sided coordination, imaging revealed a R cerebellar hemorrhage. Started on a clevi gtt for HTN.   On 8/29 the patient went for follow up CT and had some wheezing.  PCCM is consulted in this setting   In talking with son at bedside, she has had periods of this "wheezing" sound on and off for months. Has not previously felt like her throat is tight or closing, and does not right now. Does not recall ever seeing ENT. Feels like she is breathing really comfortably right now.  No previous tobacco use   Pertinent  Medical History  CVA CAD HTN DM2 Hydronephrosis  CKD3b Diastolic HF  Mild AS  Hx breast cancer  Significant Hospital Events: Including procedures, antibiotic start and stop dates in addition to other pertinent events   8/28 cerebellar bleed. Clevi gtt 8/29 PCCM consult for wheeze   Interim History / Subjective:  No acute events overnight Alert and oriented, walked back from bathroom without evidence of wheezing on exam.   Objective   Blood pressure (!) 165/67, pulse 82, temperature 98.3 F (36.8 C), temperature source Oral, resp. rate 16, weight 55.4 kg, SpO2 100%.        Intake/Output Summary (Last 24 hours) at 05/18/2023 0737 Last data filed at 05/18/2023 0700 Gross per 24 hour  Intake 1869.54 ml  Output --  Net 1869.54 ml   Filed Weights   05/16/23 2135  Weight: 55.4 kg    Examination: General: chronically ill elderly F NAD  HENT: NCAT. Winston in place.  Lungs: CTA, even unlabored  Cardiovascular:  SR on telemetry, warm and well perfused Abdomen: soft, non tender, non distended.  Extremities: no acute joint  deformity  Neuro: Alert and oriented, clear speech, soft voice, moving all extremities and ambulatory.  GU: defer  Resolved Hospital Problem list     Assessment & Plan:    Suspected VCD -8/29 consulted for her resp status/wheezing. At that time her lungs are clear -- noise is upper airway. No throat tightness, swelling, no difficulty breathing right now, no distress.  - Refused to wear Bipap yesterday afternoon - Steroids x 3 days.  - If re-occurs will need ENT consult  Cerebellar Hemorrhage with brain compression  - Neurology following - Goal SBP < 160  HTN HFpEF Small pericardial effusion  Mild-moderate AS  - Coreg, hydralazine, spironolactone. Cleviprex drip.  DM2 with hyperglycemia - On SSI  Anemia of chronic dz   - H/H stable  CKD 3b - BUN/Creatinine stable  PCCM will sign off, if has recurrent upper airway wheezing she will need ENT consult.  Best Practice (right click and "Reselect all SmartList Selections" daily)   Diet/type: Regular consistency (see orders) DVT prophylaxis: SCD, heparin subcutaneous  GI prophylaxis: H2B Lines: N/A Foley:  N/A Code Status:  DNR Last date of multidisciplinary goals of care discussion - -DNR/I -- see IPAL note. Talked about BiPAP trial if indicated, but if she were to decompensate to where adv airway is indicated, would want to be made comfortable at that time instead.   Labs   CBC: Recent Labs  Lab 05/16/23 1727  WBC 5.5  NEUTROABS 3.8  HGB 10.8*  HCT 33.5*  MCV 95.2  PLT 170    Basic Metabolic Panel: Recent Labs  Lab 05/16/23 1727 05/16/23 1956 05/17/23 0717 05/17/23 1626 05/17/23 2028 05/18/23 0508  NA 133* 134* 143 147* 149* 149*  K 3.3*  --  3.1*  --   --  3.7  CL 97*  --  107  --   --  117*  CO2 27  --  22  --   --  20*  GLUCOSE 188*  --  215*  --   --  158*  BUN 31*  --  33*  --   --  36*  CREATININE 1.65*  --  1.87*  --   --  2.16*  CALCIUM 8.1*  --  8.2*  --   --  8.3*  MG  --   --   --   --    --  2.3   GFR: Estimated Creatinine Clearance: 14.3 mL/min (A) (by C-G formula based on SCr of 2.16 mg/dL (H)). Recent Labs  Lab 05/16/23 1727  WBC 5.5    Liver Function Tests: Recent Labs  Lab 05/16/23 1727  AST 22  ALT 15  ALKPHOS 64  BILITOT 0.9  PROT 4.5*  ALBUMIN 2.6*   No results for input(s): "LIPASE", "AMYLASE" in the last 168 hours. No results for input(s): "AMMONIA" in the last 168 hours.  ABG    Component Value Date/Time   HCO3 25.4 04/14/2023 0455   ACIDBASEDEF 0.6 04/14/2023 0455   O2SAT 78.1 04/14/2023 0455     Coagulation Profile: No results for input(s): "INR", "PROTIME" in the last 168 hours.  Cardiac Enzymes: No results for input(s): "CKTOTAL", "CKMB", "CKMBINDEX", "TROPONINI" in the last 168 hours.  HbA1C: Hemoglobin A1C  Date/Time Value Ref Range Status  05/01/2023 02:19 PM 7.5 (A) 4.0 - 5.6 % Final  12/25/2022 01:44 PM 6.9 (A) 4.0 - 5.6 % Final   HbA1c, POC (controlled diabetic range)  Date/Time Value Ref Range Status  07/06/2022 01:16 PM 7.4 (A) 0.0 - 7.0 % Final  03/08/2022 11:25 AM 7.2 (A) 0.0 - 7.0 % Final   Hgb A1c MFr Bld  Date/Time Value Ref Range Status  03/22/2022 06:43 PM 7.2 (H) 4.8 - 5.6 % Final    Comment:    (NOTE) Pre diabetes:          5.7%-6.4%  Diabetes:              >6.4%  Glycemic control for   <7.0% adults with diabetes     CBG: Recent Labs  Lab 05/17/23 1112 05/17/23 1616 05/17/23 2001 05/17/23 2357 05/18/23 0356  GLUCAP 159* 83 135* 118* 167*     Critical care time: na High MDM      Earney Hamburg, MSN, AGACNP-BC [facility] Please use secure chat for messaging 05/18/2023, 7:37 AM

## 2023-05-18 NOTE — Consult Note (Signed)
Reason for Consult:wheezing Referring Physician: Tessie Fass RP  Emily Jennings is an 89 y.o. female.  HPI: hx of wheezing that has been going on for at least a year or longer.  She feels short of breath.  She does have some anxiety and and has been seen by neurology.  She had dizziness on presentation.  She has had worsening of some of her wheezing and responds to some medical therapy.  She does not have any acute changes in her voice but has had a hoarseness/low-volume voice for quite a while.  Asked to see her for evaluation of vocal cord dysfunction.  Past Medical History:  Diagnosis Date   Allergy    Anxiety    ANXIETY DISORDER, GENERALIZED 07/15/2007   Qualifier: Diagnosis of  By: Chipper Herb     Arthritis    Cancer [facility]) 2009   breast, carcinoma in situ left   Carcinoma in situ of breast 05/21/2008   Qualifier: Diagnosis of  By: [name] MD, Margaret  Diagnosed in 2009, completed 5 year course of tamoxifen, no evidence of recurrence    Carotid stenosis    11/16/2005  mild plaque formation and stenosis proximal right ECA   Cataract    Complication of anesthesia    Coronary artery disease    cardiac catheterization on 03/20/2006  LAD mid 40% stenosis, left circumflex mild 40% stenosis, RCA mid-vessel 40% to 50% lesion   EF 60%   Diabetes mellitus    GERD (gastroesophageal reflux disease)    Hernia, inguinal    left   Hyperglycemia    Hypertension    Insomnia 11/16/2011   Low blood potassium    Non-insulin dependent type 2 diabetes mellitus (HCC)    Osteoporosis    Shortness of breath    2D Echocardiogram 01/26/2009   EF of greater than 55%, mild MR, mild TR, normal ventricular function   Thickened endometrium 10/26/2017   Noted by gyne in 2017, missed 6 month follow up, referred in 09/2017   Ventricular tachycardia, non-sustained (HCC)    developed during stress test 02/08/2006, spontaneously aborted, mild reversible apical defect    Past Surgical History:  Procedure  Laterality Date   BREAST LUMPECTOMY Left 2009   Left breast 2009   CATARACT EXTRACTION W/PHACO Left 10/28/2014   Procedure: PHACO EMULSION CATARACT EXTRACTION WITH INTRAOCULAR LENS IMPLANT LEFT EYE (IOC);  Surgeon: Chalmers Guest, MD;  Location: [facility] OR;  Service: Ophthalmology;  Laterality: Left;   COLONOSCOPY     cyst removed from left foot     REFRACTIVE SURGERY Left     Family History  Problem Relation Age of Onset   Hypertension Mother    Hyperlipidemia Mother    Stroke Mother    Urticaria Mother    Cancer Father        pancreatic   Colon cancer Father    Heart disease Brother 40       bypass   Heart disease Brother 64       bypass   Arthritis Other    Asthma Other    Diabetes Other    Colon cancer Paternal Aunt    Esophageal cancer Neg Hx    Stomach cancer Neg Hx    Rectal cancer Neg Hx     Social History:  reports that she has never smoked. She has never been exposed to tobacco smoke. She has never used smokeless tobacco. She reports that she does not drink alcohol and does not use drugs.  Allergies:  Allergies  Allergen Reactions   Amlodipine Swelling   Benadryl [Diphenhydramine Hcl] Hypertension   Citalopram Other (See Comments)    unknown   Farxiga [Dapagliflozin] Other (See Comments)    DKA   Metformin And Related Diarrhea    Lost appetite and weight    Tramadol Other (See Comments)    Felt light headed and dizzy   Crestor [Rosuvastatin] Other (See Comments)    "Feet swelling", makes her feel weak    Medications: I have reviewed the patient's current medications.  Results for orders placed or performed during the hospital encounter of 05/16/23 (from the past 48 hour(s))  CBC with Differential     Status: Abnormal   Collection Time: 05/16/23  5:27 PM  Result Value Ref Range   WBC 5.5 4.0 - 10.5 K/uL   RBC 3.52 (L) 3.87 - 5.11 MIL/uL   Hemoglobin 10.8 (L) 12.0 - 15.0 g/dL   HCT 51.7 (L) 61.6 - 07.3 %   MCV 95.2 80.0 - 100.0 fL   MCH 30.7 26.0 - 34.0  pg   MCHC 32.2 30.0 - 36.0 g/dL   RDW 71.0 62.6 - 94.8 %   Platelets 170 150 - 400 K/uL   nRBC 0.0 0.0 - 0.2 %   Neutrophils Relative % 69 %   Neutro Abs 3.8 1.7 - 7.7 K/uL   Lymphocytes Relative 17 %   Lymphs Abs 0.9 0.7 - 4.0 K/uL   Monocytes Relative 11 %   Monocytes Absolute 0.6 0.1 - 1.0 K/uL   Eosinophils Relative 2 %   Eosinophils Absolute 0.1 0.0 - 0.5 K/uL   Basophils Relative 1 %   Basophils Absolute 0.0 0.0 - 0.1 K/uL   Immature Granulocytes 0 %   Abs Immature Granulocytes 0.01 0.00 - 0.07 K/uL    Comment: Performed at [facility], [address] [place], [place] [address]  Comprehensive metabolic panel     Status: Abnormal   Collection Time: 05/16/23  5:27 PM  Result Value Ref Range   Sodium 133 (L) 135 - 145 mmol/L   Potassium 3.3 (L) 3.5 - 5.1 mmol/L   Chloride 97 (L) 98 - 111 mmol/L   CO2 27 22 - 32 mmol/L   Glucose, Bld 188 (H) 70 - 99 mg/dL    Comment: Glucose reference range applies only to samples taken after fasting for at least 8 hours.   BUN 31 (H) 8 - 23 mg/dL   Creatinine, Ser 0.35 (H) 0.44 - 1.00 mg/dL   Calcium 8.1 (L) 8.9 - 10.3 mg/dL   Total Protein 4.5 (L) 6.5 - 8.1 g/dL   Albumin 2.6 (L) 3.5 - 5.0 g/dL   AST 22 15 - 41 U/L   ALT 15 0 - 44 U/L   Alkaline Phosphatase 64 38 - 126 U/L   Total Bilirubin 0.9 0.3 - 1.2 mg/dL   GFR, Estimated 30 (L) >60 mL/min    Comment: (NOTE) Calculated using the CKD-EPI Creatinine Equation (2021)    Anion gap 9 5 - 15    Comment: Performed at [facility], [address], [place], [place] [address]  Urinalysis, w/ Reflex to Culture (Infection Suspected) -Urine, Unspecified Source     Status: Abnormal   Collection Time: 05/16/23  7:45 PM  Result Value Ref Range   Specimen Source URINE, UNSPE    Color, Urine STRAW (A) YELLOW   APPearance CLEAR CLEAR   Specific Gravity, Urine 1.007 1.005 - 1.030   pH 7.0 5.0 -  8.0   Glucose, UA 150 (A) NEGATIVE mg/dL   Hgb urine dipstick NEGATIVE NEGATIVE   Bilirubin Urine  NEGATIVE NEGATIVE   Ketones, ur 5 (A) NEGATIVE mg/dL   Protein, ur >=409 (A) NEGATIVE mg/dL   Nitrite NEGATIVE NEGATIVE   Leukocytes,Ua NEGATIVE NEGATIVE   RBC / HPF 0-5 0 - 5 RBC/hpf   WBC, UA 0-5 0 - 5 WBC/hpf    Comment:        Reflex urine culture not performed if WBC <=10, OR if Squamous epithelial cells >5. If Squamous epithelial cells >5 suggest recollection.    Bacteria, UA RARE (A) NONE SEEN   Squamous Epithelial / HPF 0-5 0 - 5 /HPF    Comment: Performed at [facility], [address] [place], [place] [address]  Sodium     Status: Abnormal   Collection Time: 05/16/23  7:56 PM  Result Value Ref Range   Sodium 134 (L) 135 - 145 mmol/L    Comment: Performed at [facility], [address], [place], [place] [address]  MRSA Next Gen by PCR, Nasal     Status: None   Collection Time: 05/16/23  9:37 PM   Specimen: Nasal Mucosa; Nasal Swab  Result Value Ref Range   MRSA by PCR Next Gen NOT DETECTED NOT DETECTED    Comment: (NOTE) The GeneXpert MRSA Assay (FDA approved for NASAL specimens only), is one component of a comprehensive MRSA colonization surveillance program. It is not intended to diagnose MRSA infection nor to guide or monitor treatment for MRSA infections. Test performance is not FDA approved in patients less than 47 years old. Performed at [facility], [address] [place], [place] [address]   Glucose, capillary     Status: Abnormal   Collection Time: 05/17/23  3:56 AM  Result Value Ref Range   Glucose-Capillary 273 (H) 70 - 99 mg/dL    Comment: Glucose reference range applies only to samples taken after fasting for at least 8 hours.  Basic metabolic panel     Status: Abnormal   Collection Time: 05/17/23  7:17 AM  Result Value Ref Range   Sodium 143 135 - 145 mmol/L    Comment: DELTA CHECK NOTED   Potassium 3.1 (L) 3.5 - 5.1 mmol/L   Chloride 107 98 - 111 mmol/L   CO2 22 22 - 32 mmol/L   Glucose, Bld 215 (H) 70 - 99 mg/dL    Comment: Glucose  reference range applies only to samples taken after fasting for at least 8 hours.   BUN 33 (H) 8 - 23 mg/dL   Creatinine, Ser 0.86 (H) 0.44 - 1.00 mg/dL   Calcium 8.2 (L) 8.9 - 10.3 mg/dL   GFR, Estimated 26 (L) >60 mL/min    Comment: (NOTE) Calculated using the CKD-EPI Creatinine Equation (2021)    Anion gap 14 5 - 15    Comment: Performed at [facility], [address], [place], [place] [address]  Glucose, capillary     Status: Abnormal   Collection Time: 05/17/23  7:28 AM  Result Value Ref Range   Glucose-Capillary 214 (H) 70 - 99 mg/dL    Comment: Glucose reference range applies only to samples taken after fasting for at least 8 hours.  Glucose, capillary     Status: Abnormal   Collection Time: 05/17/23 11:12 AM  Result Value Ref Range   Glucose-Capillary 159 (H) 70 - 99 mg/dL    Comment: Glucose reference range applies only to  samples taken after fasting for at least 8 hours.  Glucose, capillary     Status: None   Collection Time: 05/17/23  4:16 PM  Result Value Ref Range   Glucose-Capillary 83 70 - 99 mg/dL    Comment: Glucose reference range applies only to samples taken after fasting for at least 8 hours.  Sodium     Status: Abnormal   Collection Time: 05/17/23  4:26 PM  Result Value Ref Range   Sodium 147 (H) 135 - 145 mmol/L    Comment: Performed at [facility], [address], [place], [place] [address]  Glucose, capillary     Status: Abnormal   Collection Time: 05/17/23  8:01 PM  Result Value Ref Range   Glucose-Capillary 135 (H) 70 - 99 mg/dL    Comment: Glucose reference range applies only to samples taken after fasting for at least 8 hours.  Sodium     Status: Abnormal   Collection Time: 05/17/23  8:28 PM  Result Value Ref Range   Sodium 149 (H) 135 - 145 mmol/L    Comment: Performed at [facility], [address], [place], [place] [address]  Glucose, capillary     Status: Abnormal   Collection Time: 05/17/23 11:57 PM  Result Value Ref  Range   Glucose-Capillary 118 (H) 70 - 99 mg/dL    Comment: Glucose reference range applies only to samples taken after fasting for at least 8 hours.  Glucose, capillary     Status: Abnormal   Collection Time: 05/18/23  3:56 AM  Result Value Ref Range   Glucose-Capillary 167 (H) 70 - 99 mg/dL    Comment: Glucose reference range applies only to samples taken after fasting for at least 8 hours.  Basic metabolic panel     Status: Abnormal   Collection Time: 05/18/23  5:08 AM  Result Value Ref Range   Sodium 149 (H) 135 - 145 mmol/L   Potassium 3.7 3.5 - 5.1 mmol/L   Chloride 117 (H) 98 - 111 mmol/L   CO2 20 (L) 22 - 32 mmol/L   Glucose, Bld 158 (H) 70 - 99 mg/dL    Comment: Glucose reference range applies only to samples taken after fasting for at least 8 hours.   BUN 36 (H) 8 - 23 mg/dL   Creatinine, Ser 0.98 (H) 0.44 - 1.00 mg/dL   Calcium 8.3 (L) 8.9 - 10.3 mg/dL   GFR, Estimated 22 (L) >60 mL/min    Comment: (NOTE) Calculated using the CKD-EPI Creatinine Equation (2021)    Anion gap 12 5 - 15    Comment: Performed at [facility], [address], [place], [place] [address]  Magnesium     Status: None   Collection Time: 05/18/23  5:08 AM  Result Value Ref Range   Magnesium 2.3 1.7 - 2.4 mg/dL    Comment: Performed at [facility], [address], [place], [place] [address]  Triglycerides     Status: None   Collection Time: 05/18/23  5:08 AM  Result Value Ref Range   Triglycerides 66 <150 mg/dL    Comment: Performed at [facility], [address], [place], [place] [address]  Glucose, capillary     Status: Abnormal   Collection Time: 05/18/23  7:42 AM  Result Value Ref Range   Glucose-Capillary 121 (H) 70 - 99 mg/dL    Comment: Glucose reference range applies only to samples taken after fasting for at  least 8 hours.  Sodium     Status: Abnormal   Collection Time: 05/18/23  8:00 AM  Result Value Ref Range   Sodium 150 (H) 135 - 145 mmol/L    Comment:  Performed at [facility], [address] [place], [place] [address]  Glucose, capillary     Status: Abnormal   Collection Time: 05/18/23 11:58 AM  Result Value Ref Range   Glucose-Capillary 294 (H) 70 - 99 mg/dL    Comment: Glucose reference range applies only to samples taken after fasting for at least 8 hours.  Glucose, capillary     Status: Abnormal   Collection Time: 05/18/23  3:31 PM  Result Value Ref Range   Glucose-Capillary 286 (H) 70 - 99 mg/dL    Comment: Glucose reference range applies only to samples taken after fasting for at least 8 hours.   *Note: Due to a large number of results and/or encounters for the requested time period, some results have not been displayed. A complete set of results can be found in Results Review.    CT HEAD WO CONTRAST ( )  Result Date: 05/17/2023 CLINICAL DATA:  Acute neurologic deficit EXAM: CT HEAD WITHOUT CONTRAST TECHNIQUE: Contiguous axial images were obtained from the base of the skull through the vertex without intravenous contrast. RADIATION DOSE REDUCTION: This exam was performed according to the departmental dose-optimization program which includes automated exposure control, adjustment of the mA and/or kV according to patient size and/or use of iterative reconstruction technique. COMPARISON:  05/17/2023 FINDINGS: Brain: Unchanged appearance of intraparenchymal hematoma in the right cerebellum. Generalized volume loss. There is periventricular hypoattenuation compatible with chronic microvascular disease. Vascular: Atherosclerotic calcification of the vertebral and internal carotid arteries at the skull base. No abnormal hyperdensity of the major intracranial arteries or dural venous sinuses. Skull: The visualized skull base, calvarium and extracranial soft tissues are normal. Sinuses/Orbits: Right sphenoid sinus opacification. The orbits are normal. IMPRESSION: Unchanged appearance of intraparenchymal hematoma in the right cerebellum.  Electronically Signed   By: Deatra Robinson M.D.   On: 05/17/2023 23:32   MR BRAIN WO CONTRAST  Result Date: 05/17/2023 CLINICAL DATA:  Stroke, hemorrhagic EXAM: MRI HEAD WITHOUT CONTRAST TECHNIQUE: Multiplanar, multiecho pulse sequences of the brain and surrounding structures were obtained without intravenous contrast. COMPARISON:  Same day CT head FINDINGS: Brain: Redemonstrated right cerebellar hematoma, which is grossly unchanged in size measuring up to 2.1 cm. Negative for an acute infarct. There is vasogenic edema surrounding the cerebellar hemorrhage with minimal mass effect on the fourth ventricle. No evidence of hydrocephalus. No extra-axial fluid collection. There is sequela of mild overall chronic microvascular ischemic change. Lack of IV contrast limits the ability to assess for an underlying lesion. Generalized volume loss. Vascular: Normal flow voids. Skull and upper cervical spine: Normal marrow signal. Sinuses/Orbits: No middle ear or mastoid effusion. Paranasal sinuses are clear. Orbits are unremarkable Other: None. IMPRESSION: Redemonstrated right cerebellar hematoma, which is grossly unchanged in size measuring up to 2.1 cm when compared to same day head CT. There is vasogenic edema surrounding the cerebellar hemorrhage with minimal mass effect on the fourth ventricle. No evidence of hydrocephalus. Electronically Signed   By: Lorenza Cambridge M.D.   On: 05/17/2023 19:46   DG CHEST PORT 1 VIEW  Result Date: 05/17/2023 CLINICAL DATA:  Respiratory abnormalities, intracranial hemorrhage EXAM: PORTABLE CHEST 1 VIEW COMPARISON:  05/16/2023 FINDINGS: Single frontal view of the chest demonstrates stable enlargement of the cardiac silhouette. Chronic interstitial prominence compatible  with scarring. No acute airspace disease, effusion, or pneumothorax. No acute bony abnormalities. IMPRESSION: 1. Stable chest, no acute process. Electronically Signed   By: Sharlet Salina M.D.   On: 05/17/2023 19:31    ECHOCARDIOGRAM COMPLETE  Result Date: 05/17/2023    ECHOCARDIOGRAM REPORT   Patient Name:   Emily Jennings Date of Exam: 05/17/2023 Medical Rec #:  [id number]     Height:       60.0 in Accession #:    [id number]    Weight:       122.1 lb Date of Birth:  [birth year]      BSA:          1.514 m Patient Age:    89 years      BP:           134/67 mmHg Patient Gender: F             HR:           78 bpm. Exam Location:  Inpatient Procedure: 2D Echo, Color Doppler and Cardiac Doppler Indications:    Stroke i63.9 (hemorrhagic)  History:        Patient has prior history of Echocardiogram examinations, most                 recent 09/13/2022. CHF; Risk Factors:Hypertension, Diabetes and                 Dyslipidemia.  Sonographer:    Irving Burton Senior RDCS Referring Phys: [phone] MCNEILL P KIRKPATRICK IMPRESSIONS  1. Left ventricular ejection fraction, by estimation, is 65 to 70%. The left ventricle has normal function. The left ventricle has no regional wall motion abnormalities. There is moderate concentric left ventricular hypertrophy. Left ventricular diastolic parameters are consistent with Grade II diastolic dysfunction (pseudonormalization).  2. Right ventricular systolic function is normal. The right ventricular size is normal. There is mildly elevated pulmonary artery systolic pressure. The estimated right ventricular systolic pressure is 38.3 mmHg.  3. Left atrial size was severely dilated.  4. Right atrial size was mildly dilated.  5. A small pericardial effusion is present. The pericardial effusion is anterior to the right ventricle.  6. Chordal systolic anterior motion of the mitral valve. . The mitral valve is grossly normal. Mild mitral valve regurgitation. No evidence of mitral stenosis.  7. The aortic valve is abnormal. There is moderate calcification of the aortic valve. Aortic valve regurgitation is trivial. Mild to moderate aortic valve stenosis. Aortic valve area, by VTI measures 1.33 cm. Aortic valve mean  gradient measures 18.5 mmHg. Aortic valve Vmax measures 2.88 m/s.  8. The inferior vena cava is normal in size with greater than 50% respiratory variability, suggesting right atrial pressure of 3 mmHg. FINDINGS  Left Ventricle: Left ventricular ejection fraction, by estimation, is 65 to 70%. The left ventricle has normal function. The left ventricle has no regional wall motion abnormalities. The left ventricular internal cavity size was normal in size. There is  moderate concentric left ventricular hypertrophy. Left ventricular diastolic parameters are consistent with Grade II diastolic dysfunction (pseudonormalization). Right Ventricle: The right ventricular size is normal. No increase in right ventricular wall thickness. Right ventricular systolic function is normal. There is mildly elevated pulmonary artery systolic pressure. The tricuspid regurgitant velocity is 2.97  m/s, and with an assumed right atrial pressure of 3 mmHg, the estimated right ventricular systolic pressure is 38.3 mmHg. Left Atrium: Left atrial size was severely dilated. Right Atrium: Right atrial size was mildly  dilated. Pericardium: A small pericardial effusion is present. The pericardial effusion is anterior to the right ventricle. Mitral Valve: Chordal systolic anterior motion of the mitral valve. The mitral valve is grossly normal. Mild mitral valve regurgitation. No evidence of mitral valve stenosis. MV peak gradient, 9.0 mmHg. The mean mitral valve gradient is 4.0 mmHg. Tricuspid Valve: The tricuspid valve is normal in structure. Tricuspid valve regurgitation is mild . No evidence of tricuspid stenosis. Aortic Valve: The aortic valve is abnormal. There is moderate calcification of the aortic valve. Aortic valve regurgitation is trivial. Mild to moderate aortic stenosis is present. Aortic valve mean gradient measures 18.5 mmHg. Aortic valve peak gradient  measures 33.2 mmHg. Aortic valve area, by VTI measures 1.33 cm. Pulmonic Valve: The  pulmonic valve was normal in structure. Pulmonic valve regurgitation is trivial. No evidence of pulmonic stenosis. Aorta: The aortic root is normal in size and structure. Venous: The inferior vena cava is normal in size with greater than 50% respiratory variability, suggesting right atrial pressure of 3 mmHg. IAS/Shunts: No atrial level shunt detected by color flow Doppler.  LEFT VENTRICLE PLAX 2D LVIDd:         3.90 cm   Diastology LVIDs:         1.90 cm   LV e' medial:    6.09 cm/s LV PW:         1.50 cm   LV E/e' medial:  22.3 LV IVS:        1.30 cm   LV e' lateral:   6.85 cm/s LVOT diam:     1.80 cm   LV E/e' lateral: 19.9 LV SV:         81 LV SV Index:   53 LVOT Area:     2.54 cm  RIGHT VENTRICLE RV S prime:     11.50 cm/s TAPSE (M-mode): 2.0 cm LEFT ATRIUM              Index        RIGHT ATRIUM           Index LA diam:        3.90 cm  2.58 cm/m   RA Area:     18.10 cm LA Vol (A2C):   104.0 ml 68.71 ml/m  RA Volume:   44.60 ml  29.47 ml/m LA Vol (A4C):   96.8 ml  63.95 ml/m LA Biplane Vol: 102.0 ml 67.39 ml/m  AORTIC VALVE AV Area (Vmax):    1.33 cm AV Area (Vmean):   1.34 cm AV Area (VTI):     1.33 cm AV Vmax:           288.00 cm/s AV Vmean:          203.000 cm/s AV VTI:            0.610 m AV Peak Grad:      33.2 mmHg AV Mean Grad:      18.5 mmHg LVOT Vmax:         151.00 cm/s LVOT Vmean:        107.000 cm/s LVOT VTI:          0.318 m LVOT/AV VTI ratio: 0.52  AORTA Ao Root diam: 2.70 cm Ao Asc diam:  3.00 cm MITRAL VALVE                TRICUSPID VALVE MV Area (PHT): 3.23 cm     TR Peak grad:   35.3 mmHg MV Area VTI:  1.77 cm     TR Vmax:        297.00 cm/s MV Peak grad:  9.0 mmHg MV Mean grad:  4.0 mmHg     SHUNTS MV Vmax:       1.50 m/s     Systemic VTI:  0.32 m MV Vmean:      93.4 cm/s    Systemic Diam: 1.80 cm MV Decel Time: 235 msec MV E velocity: 136.00 cm/s MV A velocity: 117.00 cm/s MV E/A ratio:  1.16 Weston Brass MD Electronically signed by Weston Brass MD Signature Date/Time:  05/17/2023/4:52:48 PM    Final    CT HEAD WO CONTRAST ( )  Result Date: 05/17/2023 CLINICAL DATA:  89 year old female with right cerebellar hemorrhage and edema. EXAM: CT HEAD WITHOUT CONTRAST TECHNIQUE: Contiguous axial images were obtained from the base of the skull through the vertex without intravenous contrast. RADIATION DOSE REDUCTION: This exam was performed according to the departmental dose-optimization program which includes automated exposure control, adjustment of the mA and/or kV according to patient size and/or use of iterative reconstruction technique. COMPARISON:  Head CT, CTA head and neck yesterday. Brain MRI 04/25/2023. FINDINGS: Brain: Hyperdense hemorrhage in the right cerebellum is rounded, 24 by 23 x 13 mm (AP by transverse by CC) for an estimated blood volume of 3-4 mL (versus 3 mL yesterday when measured with the same technique). Surrounding edema. Fourth ventricle and basilar cisterns remain patent. No IVH or extra-axial extension of blood is identified. Lateral and 3rd ventricle size appears stable from previous years. There is chronic nonspecific white matter hypodensity but no transependymal edema suspected. No midline shift. No new intracranial hemorrhage. No new cortically based infarct. Vascular: Extensive Calcified atherosclerosis at the skull base. Skull: Intact.  No acute osseous abnormality identified. Sinuses/Orbits: Chronic right sphenoid sinusitis with mucoperiosteal thickening. Stable sinus and mastoid aeration. Other: No acute orbit or scalp soft tissue finding. IMPRESSION: 1. Right cerebellar hemorrhage is stable to perhaps 1 mL larger since yesterday (3-4 mL overall). Surrounding edema, but no significant posterior fossa mass effect, and no other complicating features. 2. No new intracranial abnormality. Electronically Signed   By: Odessa Fleming M.D.   On: 05/17/2023 06:09   CT ANGIO HEAD NECK W WO CM  Result Date: 05/16/2023 CLINICAL DATA:  Intracranial hemorrhage EXAM:  CT ANGIOGRAPHY HEAD AND NECK WITH AND WITHOUT CONTRAST TECHNIQUE: Multidetector CT imaging of the head and neck was performed using the standard protocol during bolus administration of intravenous contrast. Multiplanar CT image reconstructions and MIPs were obtained to evaluate the vascular anatomy. Carotid stenosis measurements (when applicable) are obtained utilizing NASCET criteria, using the distal internal carotid diameter as the denominator. RADIATION DOSE REDUCTION: This exam was performed according to the departmental dose-optimization program which includes automated exposure control, adjustment of the mA and/or kV according to patient size and/or use of iterative reconstruction technique. CONTRAST:  50mL OMNIPAQUE IOHEXOL 350 MG/ML SOLN COMPARISON:  None Available. FINDINGS: CT HEAD FINDINGS Brain: In intraparenchymal hematoma within the right cerebellum measuring 2.1 x 2.0 x 1.0 cm (2 mL). Mild surrounding edema. No mass effect. The size and configuration of the ventricles and extra-axial CSF spaces are normal. There is hypoattenuation of the periventricular white matter, most commonly indicating chronic ischemic microangiopathy. Skull: The visualized skull base, calvarium and extracranial soft tissues are normal. Sinuses/Orbits: No fluid levels or advanced mucosal thickening of the visualized paranasal sinuses. No mastoid or middle ear effusion. The orbits are normal. CTA NECK FINDINGS SKELETON: There  is no bony spinal canal stenosis. No lytic or blastic lesion. OTHER NECK: Normal pharynx, larynx and major salivary glands. No cervical lymphadenopathy. 1.8 cm nodule in the left lobe of the thyroid gland. UPPER CHEST: No pneumothorax or pleural effusion. No nodules or masses. AORTIC ARCH: There is no calcific atherosclerosis of the aortic arch. Conventional 3 vessel aortic branching pattern. RIGHT CAROTID SYSTEM: Normal without aneurysm, dissection or stenosis. LEFT CAROTID SYSTEM: Normal without aneurysm,  dissection or stenosis. VERTEBRAL ARTERIES: Left dominant configuration.There is no dissection, occlusion or flow-limiting stenosis to the skull base (V1-V3 segments). CTA HEAD FINDINGS POSTERIOR CIRCULATION: --Vertebral arteries: Normal V4 segments. --Inferior cerebellar arteries: Normal. --Basilar artery: Normal. --Superior cerebellar arteries: Normal. --Posterior cerebral arteries (PCA): Normal. ANTERIOR CIRCULATION: --Intracranial internal carotid arteries: Normal. --Anterior cerebral arteries (ACA): Normal. Both A1 segments are present. Patent anterior communicating artery (a-comm). --Middle cerebral arteries (MCA): Normal. VENOUS SINUSES: As permitted by contrast timing, patent. ANATOMIC VARIANTS: None Review of the MIP images confirms the above findings. IMPRESSION: 1. Intraparenchymal hematoma within the right cerebellum measuring 2.1 x 2.0 x 1.0 cm with mild surrounding edema. No mass effect. 2. No emergent large vessel occlusion or high-grade stenosis of the intracranial arteries. 3. No aneurysm or vascular malformation. 4. Incidental left thyroid nodule measuring 1.8 cm. Recommend non-emergent thyroid ultrasound if clinically warranted given patient age. Reference: J Am Coll Radiol. 2015 Feb;12(2): 143-50 Critical Value/emergent results were called by telephone at the time of interpretation on 05/16/2023 at 7:43 pm to provider Benjiman Core , who verbally acknowledged these results. Electronically Signed   By: Deatra Robinson M.D.   On: 05/16/2023 19:43   DG Chest Portable 1 View  Result Date: 05/16/2023 CLINICAL DATA:  Weakness EXAM: PORTABLE CHEST 1 VIEW COMPARISON:  X-ray 09/16/2022 FINDINGS: Mildly enlarged cardiopericardial silhouette with a calcified aorta. Chronic lung changes with some interstitial components. No consolidation, pneumothorax or edema. Overlapping cardiac leads. Osteopenia and degenerative change. IMPRESSION: Enlarged cardiopericardial silhouette. Chronic lung changes.  Hyperinflation. Electronically Signed   By: Karen Kays M.D.   On: 05/16/2023 16:54    ROS Blood pressure (!) 142/77, pulse 81, temperature (!) 97.5 F (36.4 C), temperature source Axillary, resp. rate 18, weight 55.4 kg, SpO2 100%. Physical Exam HENT:     Head: Normocephalic.     Comments: She underwent a fiberoptic exam placing the scope through the left side of her nose.  Her airway looks normal with no swelling or lesions.  Vocal cords appear to move normally although there is occasionally an inappropriate adduction and she grunts a lot.  Overall her airway is wide open while she is experiencing shortness of breath.  I never was able to visualize the her airway while the wheezing was going on.  There is definitely no evidence of anything obstructive.  It does not appear there is any subglottic narrowing.  There is a slight pseudo sulcus suggesting reflux. Eyes:     Extraocular Movements: Extraocular movements intact.     Pupils: Pupils are equal, round, and reactive to light.  Musculoskeletal:     Cervical back: Normal range of motion.  Neurological:     Mental Status: She is alert.       Assessment/Plan: Wheezing-I did not visualize her airway while the wheezing was occurring but she is currently short of breath and feels like this is her baseline and while I was watching her inspire her vocal cords  are open the majority of the time yet she still has shortness of  breath.  I saw no obstructive lesions.  It did not appear there is an obvious subglottic narrowing.  A CT scan of her neck to evaluate the full length of her trachea for tracheal stenosis may be beneficial.  Suzanna Obey 05/18/2023, 3:43 PM

## 2023-05-18 NOTE — Progress Notes (Signed)
Patient had low urine output, about 75cc during my 12 hour shift. CCM paged and made aware.

## 2023-05-18 NOTE — Plan of Care (Signed)
  Problem: Education: Goal: Knowledge of disease or condition will improve Outcome: Progressing Goal: Knowledge of secondary prevention will improve (MUST DOCUMENT ALL) Outcome: Progressing   

## 2023-05-18 NOTE — Progress Notes (Signed)
Occupational Therapy Treatment Patient Details Name: Emily Jennings MRN: [id number] DOB: [birth year] Today's Date: 05/18/2023   History of present illness Pt is a 89 y/o female presenting on 8/28 with dizziness. CT found with nontraumatic intracerebral hemorrhage R cerebellum.  PMH includes: anxiety, breast cancer, carotid stenosis, DM, HTN, osteoporosis, insomnia.   OT comments  Patient supine in bed and agreeable to OT.  She is more impulsive and unsteady today, also with increased confusion.  She is disoriented to year, and unable to recall after reorientation.  She initially required mod assist to stand at EOB due to posterior lean while trying to take steps to bathroom, requiring redirection for safety and providing RW; completing transfers and functional mobility with min assist using RW.  She does demonstrate some awareness to functional needs in bathroom, but requires up to min assist for completion of Adls.  BP elevated initially, RN aware and adjusted medications to stay within parameters.  Continue to recommend 24/7 support, assist with all IADLs (meals, meds, and fiances, no driving) at dc. Will follow acutely.       If plan is discharge home, recommend the following:  A little help with walking and/or transfers;A little help with bathing/dressing/bathroom;Direct supervision/assist for medications management;Direct supervision/assist for financial management;Assist for transportation;Assistance with cooking/housework;Supervision due to cognitive status;Help with stairs or ramp for entrance   Equipment Recommendations  BSC/3in1;Other (comment) (RW)    Recommendations for Other Services      Precautions / Restrictions Precautions Precautions: Fall Precaution Comments: SBP 130-150 Restrictions Weight Bearing Restrictions: No       Mobility Bed Mobility Overal bed mobility: Needs Assistance Bed Mobility: Supine to Sit, Sit to Supine     Supine to sit: Min assist Sit to supine:  Contact guard assist   General bed mobility comments: min assist to elevate trunk, reaching for UE support    Transfers Overall transfer level: Needs assistance Equipment used: Rolling walker (2 wheels), None Transfers: Sit to/from Stand Sit to Stand: Min assist, Mod assist           General transfer comment: pt standing impulsively from EOB without AD, posterior lean and attempting to take steps requiring mod assist; redirected to sit and provided RW, improved to min assist but requires cueing for anterior shift, hand placement and safety     Balance Overall balance assessment: Needs assistance Sitting-balance support: No upper extremity supported, Feet supported Sitting balance-Leahy Scale: Fair     Standing balance support: Bilateral upper extremity supported, No upper extremity supported, During functional activity Standing balance-Leahy Scale: Poor Standing balance comment: relies on UE and external support                           ADL either performed or assessed with clinical judgement   ADL Overall ADL's : Needs assistance/impaired     Grooming: Set up;Sitting;Wash/dry hands           Upper Body Dressing : Set up;Sitting   Lower Body Dressing: Minimal assistance;Sit to/from stand   Toilet Transfer: Minimal assistance;Ambulation;Rolling walker (2 wheels)   Toileting- Clothing Manipulation and Hygiene: Minimal assistance;Sit to/from stand       Functional mobility during ADLs: Minimal assistance;Rolling walker (2 wheels);Cueing for safety General ADL Comments: pt with increased impulsivity and unsteadiness today, mild confusion    Extremity/Trunk Assessment Upper Extremity Assessment Upper Extremity Assessment:  (some increased edema in BUE, but likley from IV infiltations)  Vision       Perception     Praxis      Cognition Arousal: Alert Behavior During Therapy: Impulsive Overall Cognitive Status: Impaired/Different  from baseline Area of Impairment: Orientation, Attention, Memory, Following commands, Safety/judgement, Awareness, Problem solving                 Orientation Level: Disoriented to, Time Current Attention Level: Sustained Memory: Decreased recall of precautions, Decreased short-term memory Following Commands: Follows one step commands consistently, Follows one step commands with increased time, Follows multi-step commands inconsistently Safety/Judgement: Decreased awareness of deficits, Decreased awareness of safety Awareness: Emergent Problem Solving: Slow processing, Difficulty sequencing, Requires verbal cues General Comments: patient more impulsive today and requires redirection for safety, she is unable to recall year after re-orientation. Initally voices 2004, and then 2014. Functionally able to voice needs (needing new pad for underwear once in bathroom).        Exercises      Shoulder Instructions       General Comments BP 157/52 (88) at EOB and RN notified, adjusted meds and BP at end of session 145/58 (84). on 5L initally via [place], decreased to RA slowly and able to maintain Spo2 >99%, notable wheezing but RR WFL.  Pt requesting O2 once returned to bed due to SOB.    Pertinent Vitals/ Pain       Pain Assessment Pain Assessment: No/denies pain  Home Living                                          Prior Functioning/Environment              Frequency  Min 1X/week        Progress Toward Goals  OT Goals(current goals can now be found in the care plan section)  Progress towards OT goals: Progressing toward goals  Acute Rehab OT Goals Patient Stated Goal: home OT Goal Formulation: With patient Time For Goal Achievement: 06/01/23 Potential to Achieve Goals: Fair  Plan      Co-evaluation                 AM-PAC OT "6 Clicks" Daily Activity     Outcome Measure   Help from another person eating meals?: A Little Help from  another person taking care of personal grooming?: A Little Help from another person toileting, which includes using toliet, bedpan, or urinal?: A Little Help from another person bathing (including washing, rinsing, drying)?: A Little Help from another person to put on and taking off regular upper body clothing?: A Little Help from another person to put on and taking off regular lower body clothing?: A Little 6 Click Score: 18    End of Session Equipment Utilized During Treatment: Gait belt;Rolling walker (2 wheels)  OT Visit Diagnosis: Other abnormalities of gait and mobility (R26.89);Muscle weakness (generalized) (M62.81);Other symptoms and signs involving cognitive function   Activity Tolerance Patient tolerated treatment well   Patient Left in bed;with call bell/phone within reach;with bed alarm set;with nursing/sitter in room;with SCD's reapplied   Nurse Communication Mobility status        Time: 4132-4401 OT Time Calculation (min): 32 min  Charges: OT General Charges $OT Visit: 1 Visit OT Treatments $Self Care/Home Management : 23-37 mins  Barry Brunner, OT Acute Rehabilitation Services Office [phone]   Chancy Milroy 05/18/2023, 9:45 AM

## 2023-05-18 NOTE — Progress Notes (Signed)
 An USGPIV (ultrasound guided PIV) has been placed for short-term vasopressor infusion. A correctly placed ivWatch must be used when administering Vasopressors. Should this treatment be needed beyond 24 hours, central line access should be obtained.  It will be the responsibility of the bedside nurse to follow best practice to prevent extravasations.

## 2023-05-18 NOTE — Progress Notes (Signed)
SLP Cancellation Note  Patient Details Name: Emily Jennings MRN: [id number] DOB: 08-24-1934   Cancelled treatment:       Reason Eval/Treat Not Completed: Patient unavailable. Beginning session with PT. Will continue attempts to f/u for evaluation.    Gwynneth Aliment, M.A., CF-SLP Speech Language Pathology, Acute Rehabilitation Services  Secure Chat preferred [phone]  05/18/2023, 4:15 PM

## 2023-05-18 NOTE — Progress Notes (Signed)
Pt noted to have infiltration/extravasation in bilat PIV forearm sites. Dr. Amada Jupiter informed d/t pt's Emily Jennings having to be paused because of this. Pt fortunately had a third PIV in left wrist, which her 3% solution was placed. IV team at bedside at 0645 to place needed line/s. Pharmacy [facility]) informed and this RN instructed that both warm compress and cold works for infiltration/extravasation of  3% solution and Clev.

## 2023-05-18 NOTE — Evaluation (Signed)
Speech Language Pathology Evaluation Patient Details Name: Emily Jennings MRN: [id number] DOB: [birth year] Today's Date: 05/18/2023 Time: 1020-1039 SLP Time Calculation (min) (ACUTE ONLY): 19 min  Problem List:  Patient Active Problem List   Diagnosis Date Noted   DNR (do not resuscitate) 05/17/2023   Vocal cord dysfunction 05/17/2023   DNR (do not resuscitate) discussion 05/17/2023   Goals of care, counseling/discussion 05/17/2023   ICH (intracerebral hemorrhage) (HCC) 05/16/2023   Unsteady gait when walking 04/03/2023   Hospital discharge follow-up 03/06/2023   Bilateral kidney stones 03/06/2023   Hip swelling, right 02/27/2023   DKA (diabetic ketoacidosis) (HCC) 02/17/2023   High anion gap metabolic acidosis 02/17/2023   Pseudohyponatremia 02/17/2023   Hypoalbuminemia due to protein-calorie malnutrition (HCC) 02/17/2023   Acute kidney injury superimposed on chronic kidney disease (HCC) 02/17/2023   Dehydration 02/17/2023   Hip pain 02/15/2023   Encounter for Medicare annual examination with abnormal findings 10/16/2022   Vitamin D deficiency 10/16/2022   Acute exacerbation of CHF (congestive heart failure) (HCC) 09/13/2022   Acute on chronic congestive heart failure with left ventricular diastolic dysfunction (HCC) 09/13/2022   Chronic kidney disease (CKD) stage G3b/A2, moderately decreased glomerular filtration rate (GFR) between 30-44 mL/min/1.73 square meter and albuminuria creatinine ratio between 30-299 mg/g (HCC) 09/13/2022   Mild aortic valve stenosis 09/13/2022   Dyspnea on exertion 09/05/2022   CHF (congestive heart failure) (HCC) 09/05/2022   Acute on chronic diastolic (congestive) heart failure (HCC) 09/05/2022   Hyperkalemia 04/09/2022   Hyperglycemic crisis due to diabetes mellitus (HCC) 03/25/2022   Hyponatremia 03/25/2022   Acute metabolic encephalopathy 03/22/2022   Hypothermia 03/22/2022   Heart murmur 03/22/2022   Bilateral lower extremity edema 03/22/2022    Syncope and collapse 01/13/2021   Lip swelling 10/30/2020   CKD stage 4 due to type 2 diabetes mellitus (HCC) 10/30/2020   Tubular adenoma of colon 10/26/2017   Osteoporosis 07/28/2014   Diabetic hypertension-nephrosis syndrome (HCC) 03/23/2014   Type 2 diabetes mellitus with stage 3a chronic kidney disease, with long-term current use of insulin (HCC) 09/29/2013   Allergic rhinitis 01/27/2013   GERD (gastroesophageal reflux disease) 01/12/2013   CVD (cardiovascular disease) 07/17/2011   Hypokalemia 08/31/2008   Hyperlipidemia LDL goal <100 07/15/2007   Resistant hypertension 07/15/2007   Past Medical History:  Past Medical History:  Diagnosis Date   Allergy    Anxiety    ANXIETY DISORDER, GENERALIZED 07/15/2007   Qualifier: Diagnosis of  By: Chipper Herb     Arthritis    Cancer [facility]) 2009   breast, carcinoma in situ left   Carcinoma in situ of breast 05/21/2008   Qualifier: Diagnosis of  By: [name] MD, Margaret  Diagnosed in 2009, completed 5 year course of tamoxifen, no evidence of recurrence    Carotid stenosis    11/16/2005  mild plaque formation and stenosis proximal right ECA   Cataract    Complication of anesthesia    Coronary artery disease    cardiac catheterization on 03/20/2006  LAD mid 40% stenosis, left circumflex mild 40% stenosis, RCA mid-vessel 40% to 50% lesion   EF 60%   Diabetes mellitus    GERD (gastroesophageal reflux disease)    Hernia, inguinal    left   Hyperglycemia    Hypertension    Insomnia 11/16/2011   Low blood potassium    Non-insulin dependent type 2 diabetes mellitus (HCC)    Osteoporosis    Shortness of breath    2D Echocardiogram 01/26/2009   EF  of greater than 55%, mild MR, mild TR, normal ventricular function   Thickened endometrium 10/26/2017   Noted by gyne in 2017, missed 6 month follow up, referred in 09/2017   Ventricular tachycardia, non-sustained (HCC)    developed during stress test 02/08/2006, spontaneously aborted, mild  reversible apical defect   Past Surgical History:  Past Surgical History:  Procedure Laterality Date   BREAST LUMPECTOMY Left 2009   Left breast 2009   CATARACT EXTRACTION W/PHACO Left 10/28/2014   Procedure: PHACO EMULSION CATARACT EXTRACTION WITH INTRAOCULAR LENS IMPLANT LEFT EYE (IOC);  Surgeon: Chalmers Guest, MD;  Location: [facility] OR;  Service: Ophthalmology;  Laterality: Left;   COLONOSCOPY     cyst removed from left foot     REFRACTIVE SURGERY Left    HPI:  Pt is a 89 y/o female presenting on 8/28 with dizziness. CT found with nontraumatic intracerebral hemorrhage R cerebellum.  PMH includes: anxiety, breast cancer, carotid stenosis, DM, HTN, osteoporosis, insomnia.   Assessment / Plan / Recommendation Clinical Impression  Patient presents with a moderate dysarthria impacted by combination of suspected mild oral motor deficits (athough OME largely [facility]) and respiratory deficits. Additionally, she has impaired working memory and moderate complex reasoning deficit. Unclear baseline. Will continue to f/u.    SLP Assessment  SLP Recommendation/Assessment: Patient needs continued Speech Lanaguage Pathology Services SLP Visit Diagnosis: Dysarthria and anarthria (R47.1)    Recommendations for follow up therapy are one component of a multi-disciplinary discharge planning process, led by the attending physician.  Recommendations may be updated based on patient status, additional functional criteria and insurance authorization.    Follow Up Recommendations   (TBD)       Functional Status Assessment Patient has had a recent decline in their functional status and demonstrates the ability to make significant improvements in function in a reasonable and predictable amount of time.  Frequency and Duration min 2x/week  2 weeks      SLP Evaluation Cognition  Overall Cognitive Status: No family/caregiver present to determine baseline cognitive functioning Arousal/Alertness:  Awake/alert Orientation Level: Oriented X4 Attention: Sustained Sustained Attention: Impaired Sustained Attention Impairment: Verbal basic Memory: Impaired Memory Impairment: Retrieval deficit Awareness: Appears intact Problem Solving: Appears intact Problem Solving Impairment: Verbal basic Executive Function: Reasoning Reasoning: Impaired Reasoning Impairment: Verbal complex Safety/Judgment: Appears intact       Comprehension  Auditory Comprehension Overall Auditory Comprehension: Appears within functional limits for tasks assessed Visual Recognition/Discrimination Discrimination: Within Function Limits Reading Comprehension Reading Status: Within funtional limits    Expression Expression Primary Mode of Expression: Verbal Verbal Expression Overall Verbal Expression: Appears within functional limits for tasks assessed   Oral / Motor  Oral Motor/Sensory Function Overall Oral Motor/Sensory Function: Within functional limits (appears WFL based on OME) Motor Speech Overall Motor Speech: Impaired Respiration: Impaired Level of Impairment: Word Phonation: Normal Resonance: Within functional limits Articulation: Impaired Level of Impairment: Word Intelligibility: Intelligibility reduced Word: 50-74% accurate Phrase: 50-74% accurate Sentence: 50-74% accurate Conversation: 50-74% accurate Motor Planning: Witnin functional limits           [employer] MA, CCC-SLP  Donathan Buller Meryl 05/18/2023, 10:45 AM

## 2023-05-18 NOTE — Progress Notes (Addendum)
STROKE TEAM PROGRESS NOTE   BRIEF HPI [name] is a 89 y.o. female with history of  hypertension, coronary artery disease, hyperglycemia, diabetes, who presents with dizziness that started yesterday.  She states that she mainly just feels off balance, but also notes she was having some difficulty coordinating her right side.  Due to this not getting better today, she is all care in emergency department where she was seen to have a right cerebellar hemorrhage.  She has been admitted for further management.  She was very hypertensive in the emergency department and started on a Cleviprex drip.   SIGNIFICANT HOSPITAL EVENTS 8/28-transferred to [facility] for ICU care  INTERIM HISTORY/SUBJECTIVE Patient is seen in her room with no family at the bedside.  Slight dysarthria continues, but she has no other focal deficits.  Will discontinue hypertonic saline today and attempt to wean off Cleviprex in the hopes of transferring her out of the ICU tomorrow.  OBJECTIVE  CBC    Component Value Date/Time   WBC 5.5 05/16/2023 1727   RBC 3.52 (L) 05/16/2023 1727   HGB 10.8 (L) 05/16/2023 1727   HGB 11.3 02/27/2023 1400   HCT 33.5 (L) 05/16/2023 1727   HCT 35.8 02/27/2023 1400   PLT 170 05/16/2023 1727   PLT 301 02/27/2023 1400   MCV 95.2 05/16/2023 1727   MCV 92 02/27/2023 1400   MCH 30.7 05/16/2023 1727   MCHC 32.2 05/16/2023 1727   RDW 14.2 05/16/2023 1727   RDW 13.2 02/27/2023 1400   LYMPHSABS 0.9 05/16/2023 1727   LYMPHSABS 1.0 09/05/2022 1144   MONOABS 0.6 05/16/2023 1727   EOSABS 0.1 05/16/2023 1727   EOSABS 0.2 09/05/2022 1144   BASOSABS 0.0 05/16/2023 1727   BASOSABS 0.1 09/05/2022 1144    BMET    Component Value Date/Time   NA 150 (H) 05/18/2023 0800   NA 136 02/27/2023 1400   K 3.7 05/18/2023 0508   CL 117 (H) 05/18/2023 0508   CO2 20 (L) 05/18/2023 0508   GLUCOSE 158 (H) 05/18/2023 0508   BUN 36 (H) 05/18/2023 0508   BUN 29 (H) 02/27/2023 1400    CREATININE 2.16 (H) 05/18/2023 0508   CREATININE 1.70 (H) 02/01/2023 1356   CALCIUM 8.3 (L) 05/18/2023 0508   EGFR 26 (L) 02/27/2023 1400   GFRNONAA 22 (L) 05/18/2023 0508   GFRNONAA 40 (L) 06/29/2020 0916    IMAGING past 24 hours CT HEAD WO CONTRAST ( )  Result Date: 05/17/2023 CLINICAL DATA:  Acute neurologic deficit EXAM: CT HEAD WITHOUT CONTRAST TECHNIQUE: Contiguous axial images were obtained from the base of the skull through the vertex without intravenous contrast. RADIATION DOSE REDUCTION: This exam was performed according to the departmental dose-optimization program which includes automated exposure control, adjustment of the mA and/or kV according to patient size and/or use of iterative reconstruction technique. COMPARISON:  05/17/2023 FINDINGS: Brain: Unchanged appearance of intraparenchymal hematoma in the right cerebellum. Generalized volume loss. There is periventricular hypoattenuation compatible with chronic microvascular disease. Vascular: Atherosclerotic calcification of the vertebral and internal carotid arteries at the skull base. No abnormal hyperdensity of the major intracranial arteries or dural venous sinuses. Skull: The visualized skull base, calvarium and extracranial soft tissues are normal. Sinuses/Orbits: Right sphenoid sinus opacification. The orbits are normal. IMPRESSION: Unchanged appearance of intraparenchymal hematoma in the right cerebellum. Electronically Signed   By: Deatra Robinson M.D.   On: 05/17/2023 23:32   MR BRAIN WO CONTRAST  Result Date: 05/17/2023 CLINICAL DATA:  Stroke, hemorrhagic EXAM: MRI HEAD WITHOUT CONTRAST TECHNIQUE: Multiplanar, multiecho pulse sequences of the brain and surrounding structures were obtained without intravenous contrast. COMPARISON:  Same day CT head FINDINGS: Brain: Redemonstrated right cerebellar hematoma, which is grossly unchanged in size measuring up to 2.1 cm. Negative for an acute infarct. There is vasogenic edema  surrounding the cerebellar hemorrhage with minimal mass effect on the fourth ventricle. No evidence of hydrocephalus. No extra-axial fluid collection. There is sequela of mild overall chronic microvascular ischemic change. Lack of IV contrast limits the ability to assess for an underlying lesion. Generalized volume loss. Vascular: Normal flow voids. Skull and upper cervical spine: Normal marrow signal. Sinuses/Orbits: No middle ear or mastoid effusion. Paranasal sinuses are clear. Orbits are unremarkable Other: None. IMPRESSION: Redemonstrated right cerebellar hematoma, which is grossly unchanged in size measuring up to 2.1 cm when compared to same day head CT. There is vasogenic edema surrounding the cerebellar hemorrhage with minimal mass effect on the fourth ventricle. No evidence of hydrocephalus. Electronically Signed   By: Lorenza Cambridge M.D.   On: 05/17/2023 19:46   DG CHEST PORT 1 VIEW  Result Date: 05/17/2023 CLINICAL DATA:  Respiratory abnormalities, intracranial hemorrhage EXAM: PORTABLE CHEST 1 VIEW COMPARISON:  05/16/2023 FINDINGS: Single frontal view of the chest demonstrates stable enlargement of the cardiac silhouette. Chronic interstitial prominence compatible with scarring. No acute airspace disease, effusion, or pneumothorax. No acute bony abnormalities. IMPRESSION: 1. Stable chest, no acute process. Electronically Signed   By: Sharlet Salina M.D.   On: 05/17/2023 19:31    Vitals:   05/18/23 1200 05/18/23 1215 05/18/23 1230 05/18/23 1245  BP:  (!) 144/73 (!) 130/56 (!) 148/74  Pulse:  79 78 87  Resp:  15 (!) 9 20  Temp: (!) 97.5 F (36.4 C)     TempSrc: Axillary     SpO2:  100% 100% 100%  Weight:         PHYSICAL EXAM General:  Alert, well-nourished, well-developed pleasant elderly African-American lady in no acute distress Psych:  Mood and affect appropriate for situation CV: Regular rate and rhythm on monitor Respiratory:  Regular, unlabored respirations on room air GI:  Abdomen soft and nontender  NEURO:  Mental Status: AA&Ox2-3(gives wrong year), patient is able to give clear and coherent history Speech/Language: speech is mildly dysarthric. no aphasia.  Naming, repetition, fluency, and comprehension intact.  Cranial Nerves:  II: PERRL. Visual fields full.  III, IV, VI: EOMI. Eyelids elevate symmetrically.  V: Sensation is intact to light touch and symmetrical to face.  VII: Face is symmetrical resting and smiling VIII: hearing intact to voice. IX, X: Voice is slightly dysarthric XII: tongue is midline without fasciculations. Motor: 5/5 strength to all muscle groups tested.  Tone: is normal and bulk is normal Sensation- Intact to light touch bilaterally. Extinction absent to light touch to DSS.   Coordination: FTN intact bilaterally, HKS: no ataxia in BLE.No drift.  Gait- deferred   ASSESSMENT/PLAN  Intraparenchymal Hemorrhage:  Right cerebellar hemorrhage Etiology:  Hypertensive  CTA head & neck Intraparenchymal hematoma within the right cerebellum measuring 2.1 x 2.0 x 1.0 cm with mild surrounding edema. No mass effect. 2. No emergent large vessel occlusion or high-grade stenosis of the intracranial arteries. No aneurysm or vascular malformation. Incidental left thyroid nodule measuring 1.8 cm. Recommend non-emergent thyroid ultrasound if clinically warranted given patient age. Repeat CT Head - Right cerebellar hemorrhage is stable to perhaps 1 mL larger since yesterday (3-4 mL overall). Surrounding edema,  but no significant posterior fossa mass effect, and no other complicating features. MRI redemonstrated right cerebellar ICH grossly unchanged compared to last CT, with vasogenic edema and minimal mass effect on the fourth ventricle, no hydrocephalus 2D Echo EF 65 to 70%, grade 2 diastolic dysfunction, severely dilated left atrium, small pericardial effusion, no atrial level shunt LDL 65 HgbA1c 7.5 VTE prophylaxis - SCDs No antithrombotic prior  to admission, now on No antithrombotic Therapy recommendations:  Home Health PT and Home Health OT Disposition:  pending  Cerebral Edema with Brain Compression Hypertonic saline at 33ml/hr-discontinued 8/30 Q6 sodium checks  Congestive Heart Failure  Coronary Artery Disease Hypertension Home meds: Coreg, Lasix, hydralazine, spironolactone Daily Lasix on hold, one-time dose of 20 mg ordered Stable Blood Pressure Goal: SBP between 130-150 for 24 hours and then less than 160  IV Cleviprex Home meds resumed increased hydralazine to 100 mg 3 times daily Labetalol 20 mg every 2hr as needed IV hydralazine 10 mg every 4 as needed  Hyperlipidemia Home meds: Crestor, resumed in hospital LDL 65, goal < 70 Continue statin at discharge  Diabetes type II Uncontrolled Home meds: Insulin HgbA1c 7.5, goal < 7.0 CBGs SSI Recommend close follow-up with PCP for better DM control   Hospital day # 2  Patient seen and examined by NP/APP with MD. MD to update note as needed.   Cortney E Ernestina Columbia , MSN, AGACNP-BC [facility] See Amion for schedule and pager information 05/18/2023 1:35 PM   I have personally obtained history,examined this patient, reviewed notes, independently viewed imaging studies, participated in medical decision making and plan of care.ROS completed by me personally and pertinent positives fully documented  I have made any additions or clarifications directly to the above note. Agree with note above.  Patient neurological exam remained stable at with only mild dysarthria.  Blood pressure adequately controlled but still requiring IV Cleviprex drip.  Recommend increase as needed blood pressure medications and increase p.o. medication dosages and wean and discontinue appropriate prescription.  Discontinue hypertonic saline drip.  Mobilize out of bed.  Therapy consults.  Appreciate pulmonary critical care help with managing respiratory status.  Nebulizers and steroids  as per critical care team for upper airway issues.This patient is critically ill and at significant risk of neurological worsening, death and care requires constant monitoring of vital signs, hemodynamics,respiratory and cardiac monitoring, extensive review of multiple databases, frequent neurological assessment, discussion with family, other specialists and medical decision making of high complexity.I have made any additions or clarifications directly to the above note.This critical care time does not reflect procedure time, or teaching time or supervisory time of PA/NP/Med Resident etc but could involve care discussion time.  I spent 30 minutes of neurocritical care time  in the care of  this patient.    Delia Heady, MD Medical Director [facility] Pager: [phone] 05/18/2023 1:59 PM   To contact Stroke Continuity provider, please refer to [email or web address]. After hours, contact General Neurology

## 2023-05-18 NOTE — TOC Initial Note (Signed)
Transition of Care [facility]) - Initial/Assessment Note    Patient Details  Name: Emily Jennings MRN: [id number] Date of Birth: 12/21/1933  Transition of Care [facility]) CM/SW Contact:    Mearl Latin, LCSW Phone Number: 05/18/2023, 3:23 PM  Clinical Narrative:                 Patient admitted from home with spouse. TOC following for home health recommendation.   Expected Discharge Plan: Home w Home Health Services Barriers to Discharge: Continued Medical Work up   Patient Goals and CMS Choice            Expected Discharge Plan and Services   Discharge Planning Services: CM Consult   Living arrangements for the past 2 months: Single Family Home                                      Prior Living Arrangements/Services Living arrangements for the past 2 months: Single Family Home Lives with:: Spouse Patient language and need for interpreter reviewed:: Yes        Need for Family Participation in Patient Care: Yes (Comment) Care giver support system in place?: Yes (comment)   Criminal Activity/Legal Involvement Pertinent to Current Situation/Hospitalization: No - Comment as needed  Activities of Daily Living      Permission Sought/Granted                  Emotional Assessment       Orientation: : Oriented to Self, Oriented to Place, Oriented to  Time, Oriented to Situation Alcohol / Substance Use: Not Applicable Psych Involvement: No (comment)  Admission diagnosis:  ICH (intracerebral hemorrhage) (HCC) [I61.9] Right-sided nontraumatic intracerebral hemorrhage, unspecified cerebral location [facility]) [I61.9] Patient Active Problem List   Diagnosis Date Noted   DNR (do not resuscitate) 05/17/2023   Vocal cord dysfunction 05/17/2023   DNR (do not resuscitate) discussion 05/17/2023   Goals of care, counseling/discussion 05/17/2023   ICH (intracerebral hemorrhage) (HCC) 05/16/2023   Unsteady gait when walking 04/03/2023   Hospital discharge follow-up 03/06/2023    Bilateral kidney stones 03/06/2023   Hip swelling, right 02/27/2023   DKA (diabetic ketoacidosis) (HCC) 02/17/2023   High anion gap metabolic acidosis 02/17/2023   Pseudohyponatremia 02/17/2023   Hypoalbuminemia due to protein-calorie malnutrition (HCC) 02/17/2023   Acute kidney injury superimposed on chronic kidney disease (HCC) 02/17/2023   Dehydration 02/17/2023   Hip pain 02/15/2023   Encounter for Medicare annual examination with abnormal findings 10/16/2022   Vitamin D deficiency 10/16/2022   Acute exacerbation of CHF (congestive heart failure) (HCC) 09/13/2022   Acute on chronic congestive heart failure with left ventricular diastolic dysfunction (HCC) 09/13/2022   Chronic kidney disease (CKD) stage G3b/A2, moderately decreased glomerular filtration rate (GFR) between 30-44 mL/min/1.73 square meter and albuminuria creatinine ratio between 30-299 mg/g (HCC) 09/13/2022   Mild aortic valve stenosis 09/13/2022   Dyspnea on exertion 09/05/2022   CHF (congestive heart failure) (HCC) 09/05/2022   Acute on chronic diastolic (congestive) heart failure (HCC) 09/05/2022   Hyperkalemia 04/09/2022   Hyperglycemic crisis due to diabetes mellitus (HCC) 03/25/2022   Hyponatremia 03/25/2022   Acute metabolic encephalopathy 03/22/2022   Hypothermia 03/22/2022   Heart murmur 03/22/2022   Bilateral lower extremity edema 03/22/2022   Syncope and collapse 01/13/2021   Lip swelling 10/30/2020   CKD stage 4 due to type 2 diabetes mellitus (HCC) 10/30/2020   Tubular  adenoma of colon 10/26/2017   Osteoporosis 07/28/2014   Diabetic hypertension-nephrosis syndrome (HCC) 03/23/2014   Type 2 diabetes mellitus with stage 3a chronic kidney disease, with long-term current use of insulin (HCC) 09/29/2013   Allergic rhinitis 01/27/2013   GERD (gastroesophageal reflux disease) 01/12/2013   CVD (cardiovascular disease) 07/17/2011   Hypokalemia 08/31/2008   Hyperlipidemia LDL goal <100 07/15/2007    Resistant hypertension 07/15/2007   PCP:  [name], MD Pharmacy:   [facility] [phone] - [place], [place] - [address] AT [facility] [address] [place] [address] Phone: [phone] Fax: [phone]  [facility]) - [place], [place] - [address] [facility] [address] [place] [address] [place] [address] Phone: [phone] Fax: [phone]     Social Determinants of Health (SDOH) Social History: SDOH Screenings   Food Insecurity: No Food Insecurity (02/17/2023)  Housing: Low Risk  (02/17/2023)  Transportation Needs: No Transportation Needs (02/17/2023)  Utilities: Not At Risk (02/17/2023)  Alcohol Screen: Low Risk  (11/15/2022)  Depression (PHQ2-9): Low Risk  (05/16/2023)  Financial Resource Strain: Low Risk  (12/28/2022)  Physical Activity: Sufficiently Active (11/15/2022)  Social Connections: Moderately Integrated (11/15/2022)  Stress: No Stress Concern Present (12/28/2022)  Tobacco Use: Low Risk  (05/16/2023)   SDOH Interventions:     Readmission Risk Interventions    02/19/2023    8:06 AM  Readmission Risk Prevention Plan  Transportation Screening Complete  HRI or Home Care Consult Complete  Social Work Consult for Recovery Care Planning/Counseling Complete  Palliative Care Screening Not Applicable  Medication Review [employer]) Complete

## 2023-05-19 DIAGNOSIS — I614 Nontraumatic intracerebral hemorrhage in cerebellum: Secondary | ICD-10-CM | POA: Diagnosis not present

## 2023-05-19 LAB — CBC
HCT: 33.1 % — ABNORMAL LOW (ref 36.0–46.0)
Hemoglobin: 10.5 g/dL — ABNORMAL LOW (ref 12.0–15.0)
MCH: 31.2 pg (ref 26.0–34.0)
MCHC: 31.7 g/dL (ref 30.0–36.0)
MCV: 98.2 fL (ref 80.0–100.0)
Platelets: 195 10*3/uL (ref 150–400)
RBC: 3.37 MIL/uL — ABNORMAL LOW (ref 3.87–5.11)
RDW: 15.1 % (ref 11.5–15.5)
WBC: 12 10*3/uL — ABNORMAL HIGH (ref 4.0–10.5)
nRBC: 0 % (ref 0.0–0.2)

## 2023-05-19 LAB — GLUCOSE, CAPILLARY
Glucose-Capillary: 181 mg/dL — ABNORMAL HIGH (ref 70–99)
Glucose-Capillary: 44 mg/dL — CL (ref 70–99)
Glucose-Capillary: 139 mg/dL — ABNORMAL HIGH (ref 70–99)
Glucose-Capillary: 203 mg/dL — ABNORMAL HIGH (ref 70–99)
Glucose-Capillary: 60 mg/dL — ABNORMAL LOW (ref 70–99)
Glucose-Capillary: 69 mg/dL — ABNORMAL LOW (ref 70–99)
Glucose-Capillary: 93 mg/dL (ref 70–99)
Glucose-Capillary: 110 mg/dL — ABNORMAL HIGH (ref 70–99)

## 2023-05-19 LAB — BASIC METABOLIC PANEL
Anion gap: 12 (ref 5–15)
BUN: 43 mg/dL — ABNORMAL HIGH (ref 8–23)
CO2: 20 mmol/L — ABNORMAL LOW (ref 22–32)
Calcium: 8.9 mg/dL (ref 8.9–10.3)
Chloride: 111 mmol/L (ref 98–111)
Creatinine, Ser: 2.41 mg/dL — ABNORMAL HIGH (ref 0.44–1.00)
GFR, Estimated: 19 mL/min — ABNORMAL LOW (ref >60)
Glucose, Bld: 131 mg/dL — ABNORMAL HIGH (ref 70–99)
Potassium: 3.8 mmol/L (ref 3.5–5.1)
Sodium: 143 mmol/L (ref 135–145)

## 2023-05-19 MED ORDER — CARVEDILOL 12.5 MG PO TABS
25.0000 mg | ORAL_TABLET | Freq: Two times a day (BID) | ORAL | Status: DC
  Administered 2023-05-19 – 2023-05-28 (×18): 25 mg via ORAL
  Filled 2023-05-19 (×19): qty 2

## 2023-05-19 MED ORDER — DEXTROSE 50 % IV SOLN
25.0000 g | INTRAVENOUS | Status: AC
  Administered 2023-05-19: 25 g via INTRAVENOUS

## 2023-05-19 MED ORDER — DEXTROSE 50 % IV SOLN
INTRAVENOUS | Status: AC
  Filled 2023-05-19: qty 50

## 2023-05-19 MED ORDER — SODIUM CHLORIDE 0.9 % IV SOLN: INTRAVENOUS | Status: DC

## 2023-05-19 NOTE — Progress Notes (Addendum)
STROKE TEAM PROGRESS NOTE   BRIEF HPI [name] is a 89 y.o. female with history of  hypertension, coronary artery disease, hyperglycemia, diabetes, who presents with dizziness that started yesterday.  She states that she mainly just feels off balance, but also notes she was having some difficulty coordinating her right side.  Due to this not getting better today, she is all care in emergency department where she was seen to have a right cerebellar hemorrhage.  She has been admitted for further management.  She was very hypertensive in the emergency department and started on a Cleviprex drip.   SIGNIFICANT HOSPITAL EVENTS 8/28-transferred to [facility] for ICU care  INTERIM HISTORY/SUBJECTIVE Patient is seen in her room with no family at the bedside.  Dysarthria is improved but still present.  Continues to require Cleviprex for blood pressure control and has had low urine output over the last 24 hours, and creatinine has risen to 2.41.  Will start gentle IV fluids and discontinue spironolactone.  OBJECTIVE  CBC    Component Value Date/Time   WBC 12.0 (H) 05/19/2023 0429   RBC 3.37 (L) 05/19/2023 0429   HGB 10.5 (L) 05/19/2023 0429   HGB 11.3 02/27/2023 1400   HCT 33.1 (L) 05/19/2023 0429   HCT 35.8 02/27/2023 1400   PLT 195 05/19/2023 0429   PLT 301 02/27/2023 1400   MCV 98.2 05/19/2023 0429   MCV 92 02/27/2023 1400   MCH 31.2 05/19/2023 0429   MCHC 31.7 05/19/2023 0429   RDW 15.1 05/19/2023 0429   RDW 13.2 02/27/2023 1400   LYMPHSABS 0.9 05/16/2023 1727   LYMPHSABS 1.0 09/05/2022 1144   MONOABS 0.6 05/16/2023 1727   EOSABS 0.1 05/16/2023 1727   EOSABS 0.2 09/05/2022 1144   BASOSABS 0.0 05/16/2023 1727   BASOSABS 0.1 09/05/2022 1144    BMET    Component Value Date/Time   NA 143 05/19/2023 0429   NA 136 02/27/2023 1400   K 3.8 05/19/2023 0429   CL 111 05/19/2023 0429   CO2 20 (L) 05/19/2023 0429   GLUCOSE 131 (H) 05/19/2023 0429   BUN 43 (H) 05/19/2023  0429   BUN 29 (H) 02/27/2023 1400   CREATININE 2.41 (H) 05/19/2023 0429   CREATININE 1.70 (H) 02/01/2023 1356   CALCIUM 8.9 05/19/2023 0429   EGFR 26 (L) 02/27/2023 1400   GFRNONAA 19 (L) 05/19/2023 0429   GFRNONAA 40 (L) 06/29/2020 0916    IMAGING past 24 hours No results found.  Vitals:   05/19/23 1200 05/19/23 1215 05/19/23 1230 05/19/23 1245  BP: (!) 169/80 (!) 192/99 (!) 149/71 (!) 156/114  Pulse: 76 80 77 77  Resp: 15 (!) 25 15 16   Temp: 97.9 F (36.6 C)     TempSrc: Axillary     SpO2: 98% 100% 97% 98%  Weight:         PHYSICAL EXAM General:  Alert, well-nourished, well-developed pleasant elderly African-American lady in no acute distress Psych:  Mood and affect appropriate for situation CV: Regular rate and rhythm on monitor Respiratory:  Regular, unlabored respirations on room air GI: Abdomen soft and nontender  NEURO:  Mental Status: AA&Ox2-3(gives wrong month), patient is able to give clear and coherent history Speech/Language: speech is mildly dysarthric. no aphasia.  Naming, repetition, fluency, and comprehension intact.  Cranial Nerves:  II: PERRL. Visual fields full.  III, IV, VI: EOMI. Eyelids elevate symmetrically.  V: Sensation is intact to light touch and symmetrical to face.  VII: Face  is symmetrical resting and smiling VIII: hearing intact to voice. IX, X: Voice is slightly dysarthric XII: tongue is midline without fasciculations. Motor: 5/5 strength to all muscle groups tested.  Tone: is normal and bulk is normal Sensation- Intact to light touch bilaterally. Extinction absent to light touch to DSS.   Coordination: FTN with very mild right-sided ataxia, HKS: no ataxia in BLE.No drift.  Gait- deferred   ASSESSMENT/PLAN  Intraparenchymal Hemorrhage:  Right cerebellar hemorrhage, etiology: likely Hypertensive  CT head Intraparenchymal hematoma within the right cerebellum measuring 2.1 x 2.0 x 1.0 cm with mild surrounding edema.  CTA head and  neck - No emergent large vessel occlusion or high-grade stenosis of the intracranial arteries. No aneurysm or vascular malformation.   Repeat CT Head - Right cerebellar hemorrhage is stable to perhaps 1 mL larger since yesterday (3-4 mL overall). Surrounding edema, but no significant posterior fossa mass effect, and no other complicating features. MRI redemonstrated right cerebellar ICH grossly unchanged compared to last CT, with vasogenic edema and minimal mass effect on the fourth ventricle, no hydrocephalus 2D Echo EF 65 to 70%, grade 2 diastolic dysfunction, severely dilated left atrium, small pericardial effusion, no atrial level shunt LDL 65 HgbA1c 7.5 VTE prophylaxis - heparin subq No antithrombotic prior to admission, now on No antithrombotic due to ICH Therapy recommendations:  Home Health PT and Home Health OT Disposition:  pending  Cerebellar Edema Hypertonic saline at 8ml/hr->discontinued 8/30 Na now normal range Close neuro monitoring  CT 8/29 unchanged hematoma  Congestive Heart Failure  Wheezing with respiration Daily lasix at home S/p lasix yesterday 20mg  on 8/30 Per RN, family reported baseline wheezing CCM on board  Hypertension Home meds: Coreg, Lasix, hydralazine, spironolactone Stable BP goal < 160 IV Cleviprex, taper off as able On coreg 25 bid, hydralazine 100 Q8h Labetalol 20 mg every 2hr as needed IV hydralazine 10 mg every 4 as needed  Acute kidney injury Creatinine trending upwards, 1.65->1.87->2.16->2.41 Begin gentle IV fluids at 40 cc/h Discontinue spironolactone Close monitoring  Hyperlipidemia Home meds: Crestor 5, resumed in hospital LDL 65, goal < 70 Now crestor on hold Restart statin at discharge  Diabetes type II Uncontrolled Home meds: Insulin HgbA1c 7.5, goal < 7.0 CBGs SSI Recommend close follow-up with PCP for better DM control  Other stroke risk factors Advanced age CAD  Hospital day # 3  Patient seen and examined by  NP/APP with MD. MD to update note as needed.   Cortney E Ernestina Columbia , MSN, AGACNP-BC [facility] See Amion for schedule and pager information 05/19/2023 2:26 PM   ATTENDING NOTE: I reviewed above note and agree with the assessment and plan. Pt was seen and examined.   RN is at the bedside. Pt slightly sleepy but easily arousable, on exam, she is awake, alert, eyes open, orientated to place, age, year but not to month. No aphasia, however significant wheezing and mild respiratory distress, not able to finish a sentence with one breath, following all simple commands. Able to name and repeat, moderate dysarthria. No gaze palsy, tracking bilaterally, visual field full, PERRL. No facial droop. Tongue midline. Bilateral UEs 5/5, no drift. Bilaterally LEs 4/5, no drift. Sensation symmetrical bilaterally, b/l FTN intact but subjectively clumsy feeling on the right, b/l HTS intact. Gait not tested.   Pt continued to have wheezing, however, per RN, pt family reported similar wheezing at home. She has lasix at home but now with worsening AKI, will hold off lasix, start gentle  hydration, d/c spironolactone and increase coreg dose, pt urine output seems improving. Try to taper off cleviprex as able. If AKI getting worse, will need nephrology consult. PT and OT recommend home health.   For detailed assessment and plan, please refer to above/below as I have made changes wherever appropriate.   Marvel Plan, MD PhD Stroke Neurology 05/19/2023 8:02 PM  This patient is critically ill due to cerebellar ICH, hypertensive emergency, AKI, CHF and at significant risk of neurological worsening, death form hematoma expansion, brain hernation, renal failure and heart failure. This patient's care requires constant monitoring of vital signs, hemodynamics, respiratory and cardiac monitoring, review of multiple databases, neurological assessment, discussion with family, other specialists and medical decision making  of high complexity. I spent 40 minutes of neurocritical care time in the care of this patient.    To contact Stroke Continuity provider, please refer to [email or web address]. After hours, contact General Neurology

## 2023-05-19 NOTE — Plan of Care (Signed)
  Problem: Education: Goal: Knowledge of disease or condition will improve 05/19/2023 1655 by Franky Macho, RN Outcome: Progressing 05/19/2023 0911 by Franky Macho, RN Outcome: Progressing   Problem: Intracerebral Hemorrhage Tissue Perfusion: Goal: Complications of Intracerebral Hemorrhage will be minimized 05/19/2023 1655 by Franky Macho, RN Outcome: Progressing 05/19/2023 0911 by Franky Macho, RN Outcome: Progressing   Problem: Coping: Goal: Will verbalize positive feelings about self 05/19/2023 1655 by Franky Macho, RN Outcome: Progressing 05/19/2023 0911 by Franky Macho, RN Outcome: Progressing   Problem: Coping: Goal: Will identify appropriate support needs 05/19/2023 1655 by Franky Macho, RN Outcome: Progressing 05/19/2023 0911 by Franky Macho, RN Outcome: Progressing   Problem: Health Behavior/Discharge Planning: Goal: Goals will be collaboratively established with patient/family 05/19/2023 1655 by Franky Macho, RN Outcome: Progressing 05/19/2023 0911 by Franky Macho, RN Outcome: Progressing

## 2023-05-19 NOTE — Progress Notes (Signed)
SLP Cancellation Note  Patient Details Name: Emily Jennings MRN: [id number] DOB: [birth year]   Cancelled treatment:       Reason Eval/Treat Not Completed: SLP screened, RN and pt report no difficulties with eating/swallowing. Pt would like to defer full swallow eval for now. Pt passed [facility] on 8/27 and ENT scope (8/30) unremarkable. No needs identified at this time, will sign off.    Avie Echevaria, MA, CCC-SLP Acute Rehabilitation Services Office Number: [phone]  Paulette Blanch 05/19/2023, 3:25 PM

## 2023-05-19 NOTE — Progress Notes (Addendum)
CBG at 1528 read 60. Hypoglycemia protocol initiated. 4oz of apple juice given. Recheck at 1545 was 69/ Hypoglycemia protocol initiated and 4 oz of sprite given. Will repeat CBG.    1615: CBG 93

## 2023-05-20 ENCOUNTER — Inpatient Hospital Stay (HOSPITAL_COMMUNITY): Payer: Medicare Other

## 2023-05-20 DIAGNOSIS — I614 Nontraumatic intracerebral hemorrhage in cerebellum: Secondary | ICD-10-CM | POA: Diagnosis not present

## 2023-05-20 LAB — GLUCOSE, CAPILLARY
Glucose-Capillary: 102 mg/dL — ABNORMAL HIGH (ref 70–99)
Glucose-Capillary: 114 mg/dL — ABNORMAL HIGH (ref 70–99)
Glucose-Capillary: 115 mg/dL — ABNORMAL HIGH (ref 70–99)
Glucose-Capillary: 154 mg/dL — ABNORMAL HIGH (ref 70–99)
Glucose-Capillary: 168 mg/dL — ABNORMAL HIGH (ref 70–99)
Glucose-Capillary: 221 mg/dL — ABNORMAL HIGH (ref 70–99)
Glucose-Capillary: 278 mg/dL — ABNORMAL HIGH (ref 70–99)

## 2023-05-20 LAB — URINALYSIS, COMPLETE (UACMP) WITH MICROSCOPIC
Bacteria, UA: NONE SEEN
Bilirubin Urine: NEGATIVE
Glucose, UA: 50 mg/dL — AB
Hgb urine dipstick: NEGATIVE
Ketones, ur: NEGATIVE mg/dL
Leukocytes,Ua: NEGATIVE
Nitrite: NEGATIVE
Protein, ur: >=300 mg/dL — AB
Specific Gravity, Urine: 1.013 (ref 1.005–1.030)
pH: 5 (ref 5.0–8.0)

## 2023-05-20 LAB — BASIC METABOLIC PANEL
Anion gap: 11 (ref 5–15)
BUN: 49 mg/dL — ABNORMAL HIGH (ref 8–23)
CO2: 17 mmol/L — ABNORMAL LOW (ref 22–32)
Calcium: 8.3 mg/dL — ABNORMAL LOW (ref 8.9–10.3)
Chloride: 110 mmol/L (ref 98–111)
Creatinine, Ser: 2.78 mg/dL — ABNORMAL HIGH (ref 0.44–1.00)
GFR, Estimated: 16 mL/min — ABNORMAL LOW (ref >60)
Glucose, Bld: 169 mg/dL — ABNORMAL HIGH (ref 70–99)
Potassium: 4.2 mmol/L (ref 3.5–5.1)
Sodium: 138 mmol/L (ref 135–145)

## 2023-05-20 LAB — CBC
HCT: 29.9 % — ABNORMAL LOW (ref 36.0–46.0)
Hemoglobin: 9.6 g/dL — ABNORMAL LOW (ref 12.0–15.0)
MCH: 30.4 pg (ref 26.0–34.0)
MCHC: 32.1 g/dL (ref 30.0–36.0)
MCV: 94.6 fL (ref 80.0–100.0)
Platelets: 157 10*3/uL (ref 150–400)
RBC: 3.16 MIL/uL — ABNORMAL LOW (ref 3.87–5.11)
RDW: 15.3 % (ref 11.5–15.5)
WBC: 9.8 10*3/uL (ref 4.0–10.5)
nRBC: 0 % (ref 0.0–0.2)

## 2023-05-20 LAB — PROTEIN / CREATININE RATIO, URINE
Creatinine, Urine: 81 mg/dL
Protein Creatinine Ratio: 3.86 mg/mg{creat} — ABNORMAL HIGH (ref 0.00–0.15)
Total Protein, Urine: 313 mg/dL

## 2023-05-20 MED ORDER — FUROSEMIDE 10 MG/ML IJ SOLN
40.0000 mg | Freq: Two times a day (BID) | INTRAMUSCULAR | Status: DC
  Administered 2023-05-20 – 2023-05-23 (×6): 40 mg via INTRAVENOUS
  Filled 2023-05-20 (×6): qty 4

## 2023-05-20 MED ORDER — CLEVIDIPINE BUTYRATE 0.5 MG/ML IV EMUL
0.0000 mg/h | INTRAVENOUS | Status: DC
  Administered 2023-05-20: 2 mg/h via INTRAVENOUS

## 2023-05-20 NOTE — Plan of Care (Signed)
  Problem: Intracerebral Hemorrhage Tissue Perfusion: Goal: Complications of Intracerebral Hemorrhage will be minimized Outcome: Progressing   Problem: Health Behavior/Discharge Planning: Goal: Goals will be collaboratively established with patient/family Outcome: Progressing   Problem: Self-Care: Goal: Verbalization of feelings and concerns over difficulty with self-care will improve Outcome: Progressing   Problem: Self-Care: Goal: Ability to communicate needs accurately will improve Outcome: Progressing   Problem: Nutrition: Goal: Dietary intake will improve Outcome: Progressing   Problem: Fluid Volume: Goal: Ability to maintain a balanced intake and output will improve Outcome: Progressing

## 2023-05-20 NOTE — Progress Notes (Addendum)
STROKE TEAM PROGRESS NOTE   BRIEF HPI [name] is a 89 y.o. female with history of  hypertension, coronary artery disease, hyperglycemia, diabetes, who presents with dizziness that started yesterday.  She states that she mainly just feels off balance, but also notes she was having some difficulty coordinating her right side.  Due to this not getting better today, she is all care in emergency department where she was seen to have a right cerebellar hemorrhage.  She has been admitted for further management.  She was very hypertensive in the emergency department and started on a Cleviprex drip.   SIGNIFICANT HOSPITAL EVENTS 8/28-transferred to [facility] for ICU care  INTERIM HISTORY/SUBJECTIVE Patient is seen in her room with no family at the bedside.  Dysarthria continues to improve today.  Creatinine rising to 2.78 despite gentle IV hydration, will consult nephrology.  Patient had an episode of acute drowsiness in the afternoon, but would arouse to voice.  Stat head CT revealed no acute changes.  Patient continues to intermittently require Cleviprex for blood pressure control.  OBJECTIVE  CBC    Component Value Date/Time   WBC 9.8 05/20/2023 0403   RBC 3.16 (L) 05/20/2023 0403   HGB 9.6 (L) 05/20/2023 0403   HGB 11.3 02/27/2023 1400   HCT 29.9 (L) 05/20/2023 0403   HCT 35.8 02/27/2023 1400   PLT 157 05/20/2023 0403   PLT 301 02/27/2023 1400   MCV 94.6 05/20/2023 0403   MCV 92 02/27/2023 1400   MCH 30.4 05/20/2023 0403   MCHC 32.1 05/20/2023 0403   RDW 15.3 05/20/2023 0403   RDW 13.2 02/27/2023 1400   LYMPHSABS 0.9 05/16/2023 1727   LYMPHSABS 1.0 09/05/2022 1144   MONOABS 0.6 05/16/2023 1727   EOSABS 0.1 05/16/2023 1727   EOSABS 0.2 09/05/2022 1144   BASOSABS 0.0 05/16/2023 1727   BASOSABS 0.1 09/05/2022 1144    BMET    Component Value Date/Time   NA 138 05/20/2023 0403   NA 136 02/27/2023 1400   K 4.2 05/20/2023 0403   CL 110 05/20/2023 0403   CO2 17  (L) 05/20/2023 0403   GLUCOSE 169 (H) 05/20/2023 0403   BUN 49 (H) 05/20/2023 0403   BUN 29 (H) 02/27/2023 1400   CREATININE 2.78 (H) 05/20/2023 0403   CREATININE 1.70 (H) 02/01/2023 1356   CALCIUM 8.3 (L) 05/20/2023 0403   EGFR 26 (L) 02/27/2023 1400   GFRNONAA 16 (L) 05/20/2023 0403   GFRNONAA 40 (L) 06/29/2020 0916    IMAGING past 24 hours CT HEAD WO CONTRAST ( )  Result Date: 05/20/2023 CLINICAL DATA:  89 year old female with right cerebellar hemorrhage. EXAM: CT HEAD WITHOUT CONTRAST TECHNIQUE: Contiguous axial images were obtained from the base of the skull through the vertex without intravenous contrast. RADIATION DOSE REDUCTION: This exam was performed according to the departmental dose-optimization program which includes automated exposure control, adjustment of the mA and/or kV according to patient size and/or use of iterative reconstruction technique. COMPARISON:  Head CTs 05/17/2023 and earlier. FINDINGS: Brain: Hyperdense central right cerebellar hemorrhage now is 25 x 23 x 11 mm, stable since 05/17/2023. Regional edema remain stable. Partially effaced but patent 4th ventricle. And basilar cisterns remain patent. Stable gray-white differentiation elsewhere. No supratentorial midline shift or ventriculomegaly. No new intracranial hemorrhage. Vascular: Calcified atherosclerosis at the skull base. No suspicious intracranial vascular hyperdensity. Skull: Stable and intact. Sinuses/Orbits: Chronic right sphenoid sinusitis with mucoperiosteal thickening. Other Visualized paranasal sinuses and mastoids are stable and well aerated.  Other: No acute orbit or scalp soft tissue finding. IMPRESSION: 1. Right cerebellar hemorrhage unchanged since 05/17/2023. Stable edema with no significant intracranial mass effect or complicating features. 2. No new intracranial abnormality. Electronically Signed   By: Odessa Fleming M.D.   On: 05/20/2023 12:55    Vitals:   05/20/23 1200 05/20/23 1215 05/20/23 1230  05/20/23 1245  BP: (!) 171/73 (!) 155/67 127/67 (!) 158/63  Pulse: 74 79 70 76  Resp: 17 14 12 19   Temp: 98 F (36.7 C)     TempSrc: Oral     SpO2: 97% 99% 97% 98%  Weight:         PHYSICAL EXAM General:  Alert, well-nourished, well-developed pleasant elderly African-American lady in no acute distress Psych:  Mood and affect appropriate for situation CV: Regular rate and rhythm on monitor Respiratory:  Regular, unlabored respirations on room air GI: Abdomen soft and nontender  NEURO:  Mental Status: AA&Ox3, patient is able to give clear and coherent history Speech/Language: speech is mildly dysarthric. no aphasia.  Naming, repetition, fluency, and comprehension intact.  Cranial Nerves:  II: PERRL. Visual fields full.  III, IV, VI: EOMI. Eyelids elevate symmetrically.  V: Sensation is intact to light touch and symmetrical to face.  VII: Face is symmetrical resting and smiling VIII: hearing intact to voice. IX, X: Voice is slightly dysarthric XII: tongue is midline without fasciculations. Motor: 5/5 strength to all muscle groups tested.  Tone: is normal and bulk is normal Sensation- Intact to light touch bilaterally. Extinction absent to light touch to DSS.   Coordination: FTN with very mild right-sided ataxia, HKS: no ataxia in BLE.No drift.  Gait- deferred   ASSESSMENT/PLAN  Intraparenchymal Hemorrhage:  Right cerebellar hemorrhage, etiology: likely Hypertensive  CT head Intraparenchymal hematoma within the right cerebellum measuring 2.1 x 2.0 x 1.0 cm with mild surrounding edema.  CTA head and neck - No emergent large vessel occlusion or high-grade stenosis of the intracranial arteries. No aneurysm or vascular malformation.   Repeat CT Head - Right cerebellar hemorrhage is stable to perhaps 1 mL larger since yesterday (3-4 mL overall). Surrounding edema, but no significant posterior fossa mass effect, and no other complicating features. MRI redemonstrated right cerebellar  ICH grossly unchanged compared to last CT, with vasogenic edema and minimal mass effect on the fourth ventricle, no hydrocephalus Repeat CT head 9/1: Stable right cerebellar hemorrhage and no acute changes 2D Echo EF 65 to 70%, grade 2 diastolic dysfunction, severely dilated left atrium, small pericardial effusion, no atrial level shunt LDL 65 HgbA1c 7.5 VTE prophylaxis - heparin subq No antithrombotic prior to admission, now on No antithrombotic due to ICH Therapy recommendations:  Home Health PT and Home Health OT Disposition:  pending  Cerebellar Edema Hypertonic saline at 12ml/hr->discontinued 8/30 Na now normal range Close neuro monitoring  CT 8/29 unchanged hematoma  Congestive Heart Failure  wheezing Wheezing with respiration Daily lasix at home S/p lasix yesterday 20mg  on 8/30 Per RN, family reported baseline wheezing CCM did not consider wheezing associated with CHF ENT consulted and no tracheal obstruction seen  Hypertension Home meds: Coreg, Lasix, hydralazine, spironolactone Stable BP goal < 160 IV Cleviprex, taper off as able On coreg 25 bid, hydralazine 100 Q8h Labetalol 20 mg every 2hr as needed IV hydralazine 10 mg every 4 as needed  AKI on CRF Creatinine trending upwards, 1.65->1.87->2.16->2.41-> 2.78 D/C IVF, on lasix IV Q12h Discontinue spironolactone Nephrology consult placed, appreciate recommendations  Hyperlipidemia Home meds: Crestor 5, resumed  in hospital LDL 65, goal < 70 Now crestor on hold Restart statin at discharge  Diabetes type II Uncontrolled Home meds: Insulin HgbA1c 7.5, goal < 7.0 CBGs SSI Recommend close follow-up with PCP for better DM control  Other stroke risk factors Advanced age CAD  Hospital day # 4  Patient seen and examined by NP/APP with MD. MD to update note as needed.   Cortney E Ernestina Columbia , MSN, AGACNP-BC [facility] See Amion for schedule and pager information 05/20/2023 1:03 PM   ATTENDING  NOTE: I reviewed above note and agree with the assessment and plan. Pt was seen and examined.   RN at the bedside. Pt sleepy but easily arousable. Neuro stable, wheezing seems improved than yesterday. Speech easily than yesterday too. However, her Cre continues to elevate, nephrology consulted, felt to be fluid overload, d/c IVF and put on Lasix Q12h. Appreciate help. BP on the high end, off cleviprex, continue coreg and hydralazine. Hopefully the lasix will help out more BP management. Holding off transfer. Repeat CT  head stable hematoma and no hydro.   For detailed assessment and plan, please refer to above/below as I have made changes wherever appropriate.   Marvel Plan, MD PhD Stroke Neurology 05/20/2023 6:22 PM  This patient is critically ill due to cerebellar ICH, hypertensive emergency, AKI, CHF and at significant risk of neurological worsening, death form hematoma expansion, brain hernation, renal failure and heart failure. This patient's care requires constant monitoring of vital signs, hemodynamics, respiratory and cardiac monitoring, review of multiple databases, neurological assessment, discussion with family, other specialists and medical decision making of high complexity. I spent 40 minutes of neurocritical care time in the care of this patient.   To contact Stroke Continuity provider, please refer to [email or web address]. After hours, contact General Neurology

## 2023-05-20 NOTE — Progress Notes (Addendum)
Patient became more lethargic and hard to arouse around 12:15PM. Paged Dr. Roda Shutters and Ernestina Columbia (NP) and updated on status of the neurological assessment and stat CT was ordered. CT done and read. No changes. Patient now more alert and oriented.

## 2023-05-20 NOTE — Consult Note (Signed)
[facility] Renal Consultation Note  Requesting MD: Roda Shutters-  neurology Indication for Consultation: A on CRF  HPI:  Emily Jennings is a 89 y.o. female with HTN, DM and CAD presented to the ER on 8/28 with dizziness and trouble moving right side-  eval found a right cerebellar hemorrhage and very high BP-  admitted started on hypertonic saline due to cerebellar edema.  Regarding kidneys-  she seems to have some mild CKD that dates back to 2016-  a couple of episodes of A on CRF but on 04/14/23 crt was 1.6. Upon presentation of above crt was 1.6 but has worsened steadily since admitted - up to 2.7 today.  BP has been as low as 120-  off and on clevidipine- as high as 170 systolic.  Urine on 8/28 showed > 300 of protein -  not new-  no cells - UOP has not been well recorded.  Pt tells me that she has been making a normal amount of urine-  says that she has seen Dr. Wolfgang Phoenix as an OP-  had at least one stone in the past but did not have sxms of it.  She is really without complaint otherwise-  no headache-  says her weakness has improved-  looks like the cleviprex is triggered when SBP is over 160-  is currently on a dose of 6   Creat  Date/Time Value Ref Range Status  02/01/2023 01:56 PM 1.70 (H) 0.60 - 0.95 mg/dL Final  69/62/9528 41:32 AM 1.24 (H) 0.60 - 0.88 mg/dL Final    Comment:    For patients >65 years of age, the reference limit for Creatinine is approximately 13% higher for people identified as African-American. Marland Kitchen   03/24/2020 02:40 PM 0.97 (H) 0.60 - 0.88 mg/dL Final    Comment:    For patients >21 years of age, the reference limit for Creatinine is approximately 13% higher for people identified as African-American. .   08/01/2019 10:07 AM 1.24 (H) 0.60 - 0.88 mg/dL Final    Comment:    For patients >56 years of age, the reference limit for Creatinine is approximately 13% higher for people identified as African-American. Marland Kitchen   05/27/2019 08:53 AM 1.24 (H) 0.60 - 0.88  mg/dL Final    Comment:    For patients >76 years of age, the reference limit for Creatinine is approximately 13% higher for people identified as African-American. .   04/03/2019 08:52 AM 1.11 (H) 0.60 - 0.88 mg/dL Final    Comment:    For patients >76 years of age, the reference limit for Creatinine is approximately 13% higher for people identified as African-American. Marland Kitchen   11/25/2018 10:21 AM 1.19 (H) 0.60 - 0.88 mg/dL Final    Comment:    For patients >69 years of age, the reference limit for Creatinine is approximately 13% higher for people identified as African-American. .   08/14/2018 09:45 AM 1.18 (H) 0.60 - 0.88 mg/dL Final    Comment:    For patients >84 years of age, the reference limit for Creatinine is approximately 13% higher for people identified as African-American. .   11/27/2017 09:42 AM 1.24 (H) 0.60 - 0.88 mg/dL Final    Comment:    For patients >63 years of age, the reference limit for Creatinine is approximately 13% higher for people identified as African-American. Marland Kitchen   05/16/2017 09:02 AM 1.15 (H) 0.60 - 0.88 mg/dL Final    Comment:      For patients > or =  87 years of age: The upper reference limit for Creatinine is approximately 13% higher for people identified as African-American.     01/26/2017 09:41 AM 1.53 (H) 0.60 - 0.88 mg/dL Final    Comment:      For patients > or = 87 years of age: The upper reference limit for Creatinine is approximately 13% higher for people identified as African-American.     10/31/2016 08:45 AM 1.48 (H) 0.60 - 0.88 mg/dL Final    Comment:      For patients > or = 87 years of age: The upper reference limit for Creatinine is approximately 13% higher for people identified as African-American.     07/26/2016 09:14 AM 1.27 (H) 0.60 - 0.88 mg/dL Final    Comment:      For patients > or = 87 years of age: The upper reference limit for Creatinine is approximately 13% higher for people identified  as African-American.     04/06/2016 08:31 AM 1.48 (H) 0.60 - 0.88 mg/dL Final    Comment:      For patients > or = 87 years of age: The upper reference limit for Creatinine is approximately 13% higher for people identified as African-American.     12/22/2015 08:45 AM 1.50 (H) 0.60 - 0.93 mg/dL Final  91/47/8295 62:13 AM 1.16 (H) 0.60 - 0.93 mg/dL Final  08/65/7846 96:29 AM 1.08 0.50 - 1.10 mg/dL Final  52/84/1324 40:10 PM 0.98 0.50 - 1.10 mg/dL Final  27/25/3664 40:34 PM 0.98 0.50 - 1.10 mg/dL Final  74/25/9563 87:56 PM 1.26 (H) 0.50 - 1.10 mg/dL Final  43/32/9518 84:16 AM 0.98 0.50 - 1.10 mg/dL Final  60/63/0160 10:93 AM 1.01 0.50 - 1.10 mg/dL Final  23/55/7322 02:54 AM 1.11 (H) 0.50 - 1.10 mg/dL Final  27/02/2375 28:31 AM 1.18 (H) 0.50 - 1.10 mg/dL Final  51/76/1607 37:10 AM 1.31 (H) 0.50 - 1.10 mg/dL Final  62/69/4854 62:70 AM 1.28 (H) 0.50 - 1.10 mg/dL Final  35/00/9381 82:99 PM 1.26 (H) 0.50 - 1.10 mg/dL Final  37/16/9678 93:81 PM 1.12 (H) 0.50 - 1.10 mg/dL Final  01/75/1025 85:27 PM 1.31 (H) 0.50 - 1.10 mg/dL Final  78/24/2353 61:44 PM 1.13 (H) 0.50 - 1.10 mg/dL Final  31/54/0086 76:19 AM 0.89 0.40 - 1.20 mg/dL Final  50/93/2671 24:58 PM 0.97 0.40 - 1.20 mg/dL Final   Creatinine, Ser  Date/Time Value Ref Range Status  05/20/2023 04:03 AM 2.78 (H) 0.44 - 1.00 mg/dL Final  09/98/3382 50:53 AM 2.41 (H) 0.44 - 1.00 mg/dL Final  97/67/3419 37:90 AM 2.16 (H) 0.44 - 1.00 mg/dL Final  24/05/7352 29:92 AM 1.87 (H) 0.44 - 1.00 mg/dL Final  42/68/3419 62:22 PM 1.65 (H) 0.44 - 1.00 mg/dL Final  97/98/9211 94:17 AM 1.61 (H) 0.44 - 1.00 mg/dL Final  40/81/4481 85:63 PM 1.86 (H) 0.57 - 1.00 mg/dL Final  14/97/0263 78:58 AM 1.48 (H) 0.44 - 1.00 mg/dL Final  85/10/7739 28:78 AM 1.40 (H) 0.44 - 1.00 mg/dL Final  67/67/2094 70:96 AM 1.64 (H) 0.44 - 1.00 mg/dL Final  28/36/6294 76:54 PM 2.20 (H) 0.44 - 1.00 mg/dL Final  65/11/5463 68:12 AM 2.34 (H) 0.44 - 1.00 mg/dL Final  75/17/0017  49:44 AM 2.27 (H) 0.44 - 1.00 mg/dL Final  96/75/9163 84:66 AM 2.24 (H) 0.44 - 1.00 mg/dL Final  59/93/5701 77:93 AM 2.43 (H) 0.44 - 1.00 mg/dL Final  90/30/0923 30:07 AM 2.07 (H) 0.57 - 1.00 mg/dL Final  62/26/3335 45:62 AM 1.44 (H) 0.44 - 1.00 mg/dL Final  09/16/2022 05:28 AM 1.53 (H) 0.44 - 1.00 mg/dL Final  72/53/6644 03:47 AM 1.82 (H) 0.44 - 1.00 mg/dL Final  42/59/5638 75:64 AM 1.67 (H) 0.44 - 1.00 mg/dL Final  33/29/5188 41:66 AM 1.85 (H) 0.44 - 1.00 mg/dL Final  03/17/1600 09:32 PM 1.92 (H) 0.44 - 1.00 mg/dL Final  35/57/3220 25:42 AM 1.82 (H) 0.57 - 1.00 mg/dL Final  70/62/3762 83:15 AM 1.51 (H) 0.44 - 1.00 mg/dL Final  17/61/6073 71:06 PM 1.55 (H) 0.44 - 1.00 mg/dL Final  26/94/8546 27:03 AM 1.35 (H) 0.57 - 1.00 mg/dL Final  50/05/3817 29:93 PM 1.90 (H) 0.57 - 1.00 mg/dL Final  71/69/6789 38:10 AM 1.54 (H) 0.57 - 1.00 mg/dL Final  17/51/0258 52:77 PM 1.86 (H) 0.44 - 1.00 mg/dL Final  82/42/3536 14:43 PM 2.33 (H) 0.57 - 1.00 mg/dL Final  15/40/0867 61:95 AM 1.40 (H) 0.44 - 1.00 mg/dL Final  09/32/6712 45:80 AM 1.40 (H) 0.44 - 1.00 mg/dL Final  99/83/3825 05:39 AM 1.54 (H) 0.44 - 1.00 mg/dL Final  76/73/4193 79:02 AM 1.81 (H) 0.44 - 1.00 mg/dL Final  40/97/3532 99:24 PM 1.96 (H) 0.44 - 1.00 mg/dL Final  26/83/4196 22:29 AM 1.88 (H) 0.44 - 1.00 mg/dL Final  79/89/2119 41:74 PM 1.72 (H) 0.44 - 1.00 mg/dL Final  05/01/4817 56:31 PM 1.57 (H) 0.57 - 1.00 mg/dL Final  49/70/2637 85:88 AM 1.88 (H) 0.57 - 1.00 mg/dL Final  50/27/7412 87:86 AM 1.49 (H) 0.57 - 1.00 mg/dL Final  76/72/0947 09:62 AM 1.71 (H) 0.57 - 1.00 mg/dL Final  83/66/2947 65:46 AM 1.46 (H) 0.57 - 1.00 mg/dL Final  50/35/4656 81:27 PM 1.34 (H) 0.44 - 1.00 mg/dL Final  51/70/0174 94:49 AM 1.30 (H) 0.57 - 1.00 mg/dL Final  67/59/1638 46:65 PM 1.49 (H) 0.57 - 1.00 mg/dL Final  99/35/7017 79:39 PM 1.10 (H) 0.44 - 1.00 mg/dL Final  03/00/9233 00:76 AM 1.09 (H) 0.44 - 1.00 mg/dL Final  22/63/3354 56:25 AM 1.17 (H) 0.50 -  1.10 mg/dL Final  63/89/3734 28:76 AM 0.89 0.4 - 1.2 mg/dL Final  81/15/7262 03:55 AM 0.82 0.4 - 1.2 mg/dL Final  97/41/6384 53:64 AM 0.75 0.4 - 1.2 mg/dL Final  68/11/2120 48:25 PM 0.79 0.4 - 1.2 mg/dL Final     PMHx:   Past Medical History:  Diagnosis Date   Allergy    Anxiety    ANXIETY DISORDER, GENERALIZED 07/15/2007   Qualifier: Diagnosis of  By: Chipper Herb     Arthritis    Cancer [facility]) 2009   breast, carcinoma in situ left   Carcinoma in situ of breast 05/21/2008   Qualifier: Diagnosis of  By: [name] MD, Margaret  Diagnosed in 2009, completed 5 year course of tamoxifen, no evidence of recurrence    Carotid stenosis    11/16/2005  mild plaque formation and stenosis proximal right ECA   Cataract    Complication of anesthesia    Coronary artery disease    cardiac catheterization on 03/20/2006  LAD mid 40% stenosis, left circumflex mild 40% stenosis, RCA mid-vessel 40% to 50% lesion   EF 60%   Diabetes mellitus    GERD (gastroesophageal reflux disease)    Hernia, inguinal    left   Hyperglycemia    Hypertension    Insomnia 11/16/2011   Low blood potassium    Non-insulin dependent type 2 diabetes mellitus (HCC)    Osteoporosis    Shortness of breath    2D Echocardiogram 01/26/2009   EF of greater than 55%, mild MR, mild  TR, normal ventricular function   Thickened endometrium 10/26/2017   Noted by gyne in 2017, missed 6 month follow up, referred in 09/2017   Ventricular tachycardia, non-sustained (HCC)    developed during stress test 02/08/2006, spontaneously aborted, mild reversible apical defect    Past Surgical History:  Procedure Laterality Date   BREAST LUMPECTOMY Left 2009   Left breast 2009   CATARACT EXTRACTION W/PHACO Left 10/28/2014   Procedure: PHACO EMULSION CATARACT EXTRACTION WITH INTRAOCULAR LENS IMPLANT LEFT EYE (IOC);  Surgeon: Chalmers Guest, MD;  Location: [facility] OR;  Service: Ophthalmology;  Laterality: Left;   COLONOSCOPY     cyst removed from left  foot     REFRACTIVE SURGERY Left     Family Hx:  Family History  Problem Relation Age of Onset   Hypertension Mother    Hyperlipidemia Mother    Stroke Mother    Urticaria Mother    Cancer Father        pancreatic   Colon cancer Father    Heart disease Brother 40       bypass   Heart disease Brother 60       bypass   Arthritis Other    Asthma Other    Diabetes Other    Colon cancer Paternal Aunt    Esophageal cancer Neg Hx    Stomach cancer Neg Hx    Rectal cancer Neg Hx     Social History:  reports that she has never smoked. She has never been exposed to tobacco smoke. She has never used smokeless tobacco. She reports that she does not drink alcohol and does not use drugs.  Allergies:  Allergies  Allergen Reactions   Amlodipine Swelling   Benadryl [Diphenhydramine Hcl] Hypertension   Citalopram Other (See Comments)    unknown   Farxiga [Dapagliflozin] Other (See Comments)    DKA   Metformin And Related Diarrhea    Lost appetite and weight    Tramadol Other (See Comments)    Felt light headed and dizzy   Crestor [Rosuvastatin] Other (See Comments)    "Feet swelling", makes her feel weak    Medications: Prior to Admission medications   Medication Sig Start Date End Date Taking? Authorizing Provider  calcitRIOL (ROCALTROL) 0.25 MCG capsule Take 0.25 mcg by mouth 3 (three) times a week.   Yes [provider]  carvedilol (COREG) 12.5 MG tablet Take two tablets by mouth two times daily 04/03/23  Yes [name], MD  furosemide (LASIX) 40 MG tablet Take 1 tablet (40 mg total) by mouth 2 (two) times daily. Patient taking differently: Take 40 mg by mouth daily. 10/09/22  Yes Sharlene Dory, NP  hydrALAZINE (APRESOLINE) 25 MG tablet Take one tablet by mouth two times daily Patient taking differently: Take 25 mg by mouth 3 (three) times daily. Take one tablet by mouth two times daily 04/24/23  Yes [name], MD  insulin degludec (TRESIBA FLEXTOUCH)  100 UNIT/ML FlexTouch Pen Inject 5 Units into the skin daily. 05/01/23  Yes Shamleffer, Konrad Dolores, MD  insulin lispro (HUMALOG KWIKPEN) 100 UNIT/ML KwikPen Inject 5 Units into the skin 3 (three) times daily. Max daily 30 units 05/01/23  Yes Shamleffer, Konrad Dolores, MD  mupirocin ointment (BACTROBAN) 2 % Apply 1 Application topically 2 (two) times daily. 05/01/23  Yes [name], MD  rosuvastatin (CRESTOR) 5 MG tablet TAKE 1 TABLET (5 MG TOTAL) BY MOUTH DAILY. 01/15/23  Yes [name], MD  spironolactone (ALDACTONE) 50  MG tablet Take 1 tablet (50 mg total) by mouth daily. 05/13/23  Yes Shamleffer, Konrad Dolores, MD  blood glucose meter kit and supplies KIT One touch Ultra. Use three times daily as directed. (FOR ICD E11.65) 10/27/20   Roma Kayser, MD  Continuous Blood Gluc Sensor (DEXCOM G7 SENSOR) MISC 1 Device by Does not apply route as directed. 12/25/22   Shamleffer, Konrad Dolores, MD  glucose blood (ONETOUCH ULTRA) test strip USE TO TEST 4 TIMES A DAY 01/09/23   Roma Kayser, MD  Insulin Pen Needle 32G X 4 MM MISC 1 each by Does not apply route in the morning and at bedtime. 12/25/22   Shamleffer, Konrad Dolores, MD  OneTouch Delica Lancets 33G MISC 1 each by Does not apply route 3 (three) times daily. Use to check glucose three times daily 10/03/22   Roma Kayser, MD    I have reviewed the patient's current medications.  Labs:  Results for orders placed or performed during the hospital encounter of 05/16/23 (from the past 48 hour(s))  Glucose, capillary     Status: Abnormal   Collection Time: 05/18/23  3:31 PM  Result Value Ref Range   Glucose-Capillary 286 (H) 70 - 99 mg/dL    Comment: Glucose reference range applies only to samples taken after fasting for at least 8 hours.  Glucose, capillary     Status: Abnormal   Collection Time: 05/18/23  7:55 PM  Result Value Ref Range   Glucose-Capillary 156 (H) 70 - 99 mg/dL    Comment: Glucose  reference range applies only to samples taken after fasting for at least 8 hours.  Glucose, capillary     Status: Abnormal   Collection Time: 05/18/23 11:32 PM  Result Value Ref Range   Glucose-Capillary 112 (H) 70 - 99 mg/dL    Comment: Glucose reference range applies only to samples taken after fasting for at least 8 hours.  Glucose, capillary     Status: Abnormal   Collection Time: 05/19/23  3:43 AM  Result Value Ref Range   Glucose-Capillary 44 (LL) 70 - 99 mg/dL    Comment: Glucose reference range applies only to samples taken after fasting for at least 8 hours.   Comment 1 Notify RN   Glucose, capillary     Status: Abnormal   Collection Time: 05/19/23  4:18 AM  Result Value Ref Range   Glucose-Capillary 181 (H) 70 - 99 mg/dL    Comment: Glucose reference range applies only to samples taken after fasting for at least 8 hours.  Basic metabolic panel     Status: Abnormal   Collection Time: 05/19/23  4:29 AM  Result Value Ref Range   Sodium 143 135 - 145 mmol/L   Potassium 3.8 3.5 - 5.1 mmol/L   Chloride 111 98 - 111 mmol/L   CO2 20 (L) 22 - 32 mmol/L   Glucose, Bld 131 (H) 70 - 99 mg/dL    Comment: Glucose reference range applies only to samples taken after fasting for at least 8 hours.   BUN 43 (H) 8 - 23 mg/dL   Creatinine, Ser 6.21 (H) 0.44 - 1.00 mg/dL   Calcium 8.9 8.9 - 30.8 mg/dL   GFR, Estimated 19 (L) >60 mL/min    Comment: (NOTE) Calculated using the CKD-EPI Creatinine Equation (2021)    Anion gap 12 5 - 15    Comment: Performed at [facility], [address], [place], [place] [address]  CBC  Status: Abnormal   Collection Time: 05/19/23  4:29 AM  Result Value Ref Range   WBC 12.0 (H) 4.0 - 10.5 K/uL   RBC 3.37 (L) 3.87 - 5.11 MIL/uL   Hemoglobin 10.5 (L) 12.0 - 15.0 g/dL   HCT 95.6 (L) 38.7 - 56.4 %   MCV 98.2 80.0 - 100.0 fL   MCH 31.2 26.0 - 34.0 pg   MCHC 31.7 30.0 - 36.0 g/dL   RDW 33.2 95.1 - 88.4 %   Platelets 195 150 - 400 K/uL   nRBC 0.0  0.0 - 0.2 %    Comment: Performed at [facility], [address], [place], [place] [address]  Glucose, capillary     Status: Abnormal   Collection Time: 05/19/23  7:59 AM  Result Value Ref Range   Glucose-Capillary 139 (H) 70 - 99 mg/dL    Comment: Glucose reference range applies only to samples taken after fasting for at least 8 hours.  Glucose, capillary     Status: Abnormal   Collection Time: 05/19/23 11:12 AM  Result Value Ref Range   Glucose-Capillary 203 (H) 70 - 99 mg/dL    Comment: Glucose reference range applies only to samples taken after fasting for at least 8 hours.  Glucose, capillary     Status: Abnormal   Collection Time: 05/19/23  3:28 PM  Result Value Ref Range   Glucose-Capillary 60 (L) 70 - 99 mg/dL    Comment: Glucose reference range applies only to samples taken after fasting for at least 8 hours.  Glucose, capillary     Status: Abnormal   Collection Time: 05/19/23  3:47 PM  Result Value Ref Range   Glucose-Capillary 69 (L) 70 - 99 mg/dL    Comment: Glucose reference range applies only to samples taken after fasting for at least 8 hours.  Glucose, capillary     Status: None   Collection Time: 05/19/23  4:15 PM  Result Value Ref Range   Glucose-Capillary 93 70 - 99 mg/dL    Comment: Glucose reference range applies only to samples taken after fasting for at least 8 hours.  Glucose, capillary     Status: Abnormal   Collection Time: 05/19/23  8:07 PM  Result Value Ref Range   Glucose-Capillary 110 (H) 70 - 99 mg/dL    Comment: Glucose reference range applies only to samples taken after fasting for at least 8 hours.  Glucose, capillary     Status: Abnormal   Collection Time: 05/20/23 12:04 AM  Result Value Ref Range   Glucose-Capillary 114 (H) 70 - 99 mg/dL    Comment: Glucose reference range applies only to samples taken after fasting for at least 8 hours.  Glucose, capillary     Status: Abnormal   Collection Time: 05/20/23  3:42 AM  Result Value Ref  Range   Glucose-Capillary 154 (H) 70 - 99 mg/dL    Comment: Glucose reference range applies only to samples taken after fasting for at least 8 hours.  Basic metabolic panel     Status: Abnormal   Collection Time: 05/20/23  4:03 AM  Result Value Ref Range   Sodium 138 135 - 145 mmol/L   Potassium 4.2 3.5 - 5.1 mmol/L   Chloride 110 98 - 111 mmol/L   CO2 17 (L) 22 - 32 mmol/L   Glucose, Bld 169 (H) 70 - 99 mg/dL    Comment: Glucose reference range applies only to samples taken after fasting for at least 8 hours.  BUN 49 (H) 8 - 23 mg/dL   Creatinine, Ser 1.47 (H) 0.44 - 1.00 mg/dL   Calcium 8.3 (L) 8.9 - 10.3 mg/dL   GFR, Estimated 16 (L) >60 mL/min    Comment: (NOTE) Calculated using the CKD-EPI Creatinine Equation (2021)    Anion gap 11 5 - 15    Comment: Performed at [facility], [address] [place], [place] [address]  CBC     Status: Abnormal   Collection Time: 05/20/23  4:03 AM  Result Value Ref Range   WBC 9.8 4.0 - 10.5 K/uL   RBC 3.16 (L) 3.87 - 5.11 MIL/uL   Hemoglobin 9.6 (L) 12.0 - 15.0 g/dL   HCT 21.3 (L) 08.6 - 57.8 %   MCV 94.6 80.0 - 100.0 fL   MCH 30.4 26.0 - 34.0 pg   MCHC 32.1 30.0 - 36.0 g/dL   RDW 46.9 62.9 - 52.8 %   Platelets 157 150 - 400 K/uL   nRBC 0.0 0.0 - 0.2 %    Comment: Performed at [facility], [address] [place], [place] [address]  Glucose, capillary     Status: Abnormal   Collection Time: 05/20/23  7:31 AM  Result Value Ref Range   Glucose-Capillary 221 (H) 70 - 99 mg/dL    Comment: Glucose reference range applies only to samples taken after fasting for at least 8 hours.  Glucose, capillary     Status: Abnormal   Collection Time: 05/20/23 12:00 PM  Result Value Ref Range   Glucose-Capillary 278 (H) 70 - 99 mg/dL    Comment: Glucose reference range applies only to samples taken after fasting for at least 8 hours.   *Note: Due to a large number of results and/or encounters for the requested time period, some results  have not been displayed. A complete set of results can be found in Results Review.     ROS:  A comprehensive review of systems was negative.  Physical Exam: Vitals:   05/20/23 1400 05/20/23 1415  BP: (!) 162/69 (!) 161/77  Pulse: 77 73  Resp: (!) 27 (!) 23  Temp:    SpO2: 98% 100%     General: thin BF-  alert, NAD-  eating her meal-  husband at bedside HEENT: PERRLA, EOMI, mucous membranes moist  Neck: no JVD Heart: RRR Lungs: dec BS at bases Abdomen: soft , non tender Extremities: pitting edema throughout Skin: warm and dry Neuro: alert non focal  Assessment/Plan: 89 year old BF with HTN and stage 3 CKD- now presents with cerebellar hemorrhage with elevated BP and has had worsening of renal function since admit 1.Renal- appears that baseline CKD with crt at best of 1.6-  has proteinuria and has seen Bhutani as OP-  mention of stones and fullness in kidney-  SPEP negative-  was on aldactone.  Now with A on CRF in the setting of variable HTN due to cerebellar bleed and IV BP meds.  With common things being common I suspect this is a hemodynamic issue with variable BP as noted above but also want to look at this fullness of the kidney that she had as OP-  will recheck urinalysis now that renal function is worse-  place foley, check [place] and quantify protein.  No indications for dialysis but trend is worrisome 2. Hypertension/volume  - was noted that BP was "very high " on admit.  Over hosp has been variable-  as low as 120 and as high  as 170 systolic.  Is currently on coreg 25 BID and hydral 100 TID with PRN hydral and labetalol with utilizing cleviprex if SBP is over 170.  What I notice on exam is that she is very overloaded-  this could be contributing to her high BP-  am going to stop the IVF and start some lasix-  my goal would be to eliminate the need for the cleviprex 3. Anemia  - not a major issue    Cecille Aver 05/20/2023, 2:43 PM

## 2023-05-21 DIAGNOSIS — N179 Acute kidney failure, unspecified: Secondary | ICD-10-CM | POA: Diagnosis not present

## 2023-05-21 DIAGNOSIS — I161 Hypertensive emergency: Secondary | ICD-10-CM | POA: Diagnosis not present

## 2023-05-21 DIAGNOSIS — I614 Nontraumatic intracerebral hemorrhage in cerebellum: Secondary | ICD-10-CM | POA: Diagnosis not present

## 2023-05-21 LAB — CBC
HCT: 33.1 % — ABNORMAL LOW (ref 36.0–46.0)
Hemoglobin: 10.2 g/dL — ABNORMAL LOW (ref 12.0–15.0)
MCH: 30.1 pg (ref 26.0–34.0)
MCHC: 30.8 g/dL (ref 30.0–36.0)
MCV: 97.6 fL (ref 80.0–100.0)
Platelets: 157 10*3/uL (ref 150–400)
RBC: 3.39 MIL/uL — ABNORMAL LOW (ref 3.87–5.11)
RDW: 15.2 % (ref 11.5–15.5)
WBC: 7.9 10*3/uL (ref 4.0–10.5)
nRBC: 0 % (ref 0.0–0.2)

## 2023-05-21 LAB — BASIC METABOLIC PANEL
Anion gap: 11 (ref 5–15)
BUN: 58 mg/dL — ABNORMAL HIGH (ref 8–23)
CO2: 19 mmol/L — ABNORMAL LOW (ref 22–32)
Calcium: 8.2 mg/dL — ABNORMAL LOW (ref 8.9–10.3)
Chloride: 107 mmol/L (ref 98–111)
Creatinine, Ser: 2.97 mg/dL — ABNORMAL HIGH (ref 0.44–1.00)
GFR, Estimated: 15 mL/min — ABNORMAL LOW (ref >60)
Glucose, Bld: 218 mg/dL — ABNORMAL HIGH (ref 70–99)
Potassium: 3.9 mmol/L (ref 3.5–5.1)
Sodium: 137 mmol/L (ref 135–145)

## 2023-05-21 LAB — GLUCOSE, CAPILLARY
Glucose-Capillary: 102 mg/dL — ABNORMAL HIGH (ref 70–99)
Glucose-Capillary: 148 mg/dL — ABNORMAL HIGH (ref 70–99)
Glucose-Capillary: 215 mg/dL — ABNORMAL HIGH (ref 70–99)
Glucose-Capillary: 231 mg/dL — ABNORMAL HIGH (ref 70–99)
Glucose-Capillary: 300 mg/dL — ABNORMAL HIGH (ref 70–99)
Glucose-Capillary: 52 mg/dL — ABNORMAL LOW (ref 70–99)

## 2023-05-21 MED ORDER — DOXAZOSIN MESYLATE 1 MG PO TABS
1.0000 mg | ORAL_TABLET | Freq: Every day | ORAL | Status: DC
  Administered 2023-05-21: 1 mg via ORAL
  Filled 2023-05-21 (×2): qty 1

## 2023-05-21 MED ORDER — INSULIN GLARGINE-YFGN 100 UNIT/ML ~~LOC~~ SOLN
5.0000 [IU] | Freq: Every day | SUBCUTANEOUS | Status: DC
  Administered 2023-05-21 – 2023-05-23 (×3): 5 [IU] via SUBCUTANEOUS
  Filled 2023-05-21 (×3): qty 0.05

## 2023-05-21 NOTE — Progress Notes (Addendum)
STROKE TEAM PROGRESS NOTE   BRIEF HPI [name] is a 89 y.o. female with history of  hypertension, coronary artery disease, hyperglycemia, diabetes, who presents with dizziness that started yesterday.  She states that she mainly just feels off balance, but also notes she was having some difficulty coordinating her right side.  Due to this not getting better today, she is all care in emergency department where she was seen to have a right cerebellar hemorrhage.  She has been admitted for further management.  She was very hypertensive in the emergency department and started on a Cleviprex drip.   SIGNIFICANT HOSPITAL EVENTS 8/28-transferred to [facility] for ICU care 9/2 transfer to floor  INTERIM HISTORY/SUBJECTIVE No family at the bedside. She is sitting in the chair in NAD. Neurological exam is unchanged.  Will transfer her out of the ICU today  Cr 2.97  OBJECTIVE  CBC    Component Value Date/Time   WBC 7.9 05/21/2023 0400   RBC 3.39 (L) 05/21/2023 0400   HGB 10.2 (L) 05/21/2023 0400   HGB 11.3 02/27/2023 1400   HCT 33.1 (L) 05/21/2023 0400   HCT 35.8 02/27/2023 1400   PLT 157 05/21/2023 0400   PLT 301 02/27/2023 1400   MCV 97.6 05/21/2023 0400   MCV 92 02/27/2023 1400   MCH 30.1 05/21/2023 0400   MCHC 30.8 05/21/2023 0400   RDW 15.2 05/21/2023 0400   RDW 13.2 02/27/2023 1400   LYMPHSABS 0.9 05/16/2023 1727   LYMPHSABS 1.0 09/05/2022 1144   MONOABS 0.6 05/16/2023 1727   EOSABS 0.1 05/16/2023 1727   EOSABS 0.2 09/05/2022 1144   BASOSABS 0.0 05/16/2023 1727   BASOSABS 0.1 09/05/2022 1144    BMET    Component Value Date/Time   NA 137 05/21/2023 0400   NA 136 02/27/2023 1400   K 3.9 05/21/2023 0400   CL 107 05/21/2023 0400   CO2 19 (L) 05/21/2023 0400   GLUCOSE 218 (H) 05/21/2023 0400   BUN 58 (H) 05/21/2023 0400   BUN 29 (H) 02/27/2023 1400   CREATININE 2.97 (H) 05/21/2023 0400   CREATININE 1.70 (H) 02/01/2023 1356   CALCIUM 8.2 (L) 05/21/2023  0400   EGFR 26 (L) 02/27/2023 1400   GFRNONAA 15 (L) 05/21/2023 0400   GFRNONAA 40 (L) 06/29/2020 0916    IMAGING past 24 hours US RENAL  Result Date: 05/20/2023 CLINICAL DATA:  Acute renal insufficiency EXAM: RENAL / URINARY TRACT ULTRASOUND COMPLETE COMPARISON:  02/18/2023 FINDINGS: Right Kidney: Renal measurements: 7.1 x 3.2 x 4.2 cm = volume: 49.9 mL. Increased renal cortical echotexture and renal atrophy consistent with medical renal disease. No hydronephrosis. Subcentimeter simple cyst does not require imaging follow-up. Left Kidney: Renal measurements: 10.6 x 3.7 x 5.0 cm = volume: 102.9 mL. Increased renal cortical echotexture consistent with medical renal disease. Simple left renal cysts again seen, largest in the upper pole measuring 2.8 cm. No specific imaging follow-up is required. 11 mm calcification upper pole left kidney. No hydronephrosis. Bladder: Bladder is minimally distended, limiting its evaluation. No gross abnormality. Other: Incidental small left pleural effusion. IMPRESSION: 1. Bilateral increased renal cortical echotexture consistent with medical renal disease. 2. Nonobstructing 11 mm left renal calculus.  No hydronephrosis. 3. Simple bilateral renal cortical cysts do not require imaging follow-up. Electronically Signed   By: Sharlet Salina M.D.   On: 05/20/2023 19:53   CT HEAD WO CONTRAST ( )  Result Date: 05/20/2023 CLINICAL DATA:  89 year old female with right cerebellar hemorrhage. EXAM:  CT HEAD WITHOUT CONTRAST TECHNIQUE: Contiguous axial images were obtained from the base of the skull through the vertex without intravenous contrast. RADIATION DOSE REDUCTION: This exam was performed according to the departmental dose-optimization program which includes automated exposure control, adjustment of the mA and/or kV according to patient size and/or use of iterative reconstruction technique. COMPARISON:  Head CTs 05/17/2023 and earlier. FINDINGS: Brain: Hyperdense central right  cerebellar hemorrhage now is 25 x 23 x 11 mm, stable since 05/17/2023. Regional edema remain stable. Partially effaced but patent 4th ventricle. And basilar cisterns remain patent. Stable gray-white differentiation elsewhere. No supratentorial midline shift or ventriculomegaly. No new intracranial hemorrhage. Vascular: Calcified atherosclerosis at the skull base. No suspicious intracranial vascular hyperdensity. Skull: Stable and intact. Sinuses/Orbits: Chronic right sphenoid sinusitis with mucoperiosteal thickening. Other Visualized paranasal sinuses and mastoids are stable and well aerated. Other: No acute orbit or scalp soft tissue finding. IMPRESSION: 1. Right cerebellar hemorrhage unchanged since 05/17/2023. Stable edema with no significant intracranial mass effect or complicating features. 2. No new intracranial abnormality. Electronically Signed   By: Odessa Fleming M.D.   On: 05/20/2023 12:55    Vitals:   05/21/23 0930 05/21/23 1000 05/21/23 1030 05/21/23 1100  BP: (!) 171/64 (!) 150/73 (!) 164/83 (!) 150/63  Pulse: 69 70 70 68  Resp: 13 (!) 8 14 10   Temp:      TempSrc:      SpO2: 94% 92% 93% 94%  Weight:         PHYSICAL EXAM General:  Alert, well-nourished, well-developed pleasant elderly African-American lady in no acute distress Psych:  Mood and affect appropriate for situation CV: Regular rate and rhythm on monitor Respiratory:  Regular, unlabored respirations on room air GI: Abdomen soft and nontender  NEURO:  Mental Status: She is drowsy, awakens easily. AA&Ox3, patient is able to give clear and coherent history Speech/Language: speech is mildly dysarthric. no aphasia.  Naming, repetition, fluency, and comprehension intact.  Cranial Nerves:  II: PERRL. Visual fields full.  III, IV, VI: EOMI. Eyelids elevate symmetrically.  V: Sensation is intact to light touch and symmetrical to face.  VII: Face is symmetrical resting and smiling VIII: hearing intact to voice. IX, X: Voice is  slightly dysarthric XII: tongue is midline without fasciculations. Motor: 5/5 strength to all muscle groups tested.  Tone: is normal and bulk is normal Sensation- Intact to light touch bilaterally. Extinction absent to light touch to DSS.   Coordination: FTN with very mild right-sided ataxia, HKS: no ataxia in BLE.No drift.  Gait- deferred   ASSESSMENT/PLAN  Intraparenchymal Hemorrhage:  Right cerebellar hemorrhage, etiology: likely Hypertensive  CT head Intraparenchymal hematoma within the right cerebellum measuring 2.1 x 2.0 x 1.0 cm with mild surrounding edema.  CTA head and neck - No emergent large vessel occlusion or high-grade stenosis of the intracranial arteries. No aneurysm or vascular malformation.   Repeat CT Head - Right cerebellar hemorrhage is stable to perhaps 1 mL larger since yesterday (3-4 mL overall). Surrounding edema, but no significant posterior fossa mass effect, and no other complicating features. MRI redemonstrated right cerebellar ICH grossly unchanged compared to last CT, with vasogenic edema and minimal mass effect on the fourth ventricle, no hydrocephalus Repeat CT head 9/1: Stable right cerebellar hemorrhage and no acute changes 2D Echo EF 65 to 70%, grade 2 diastolic dysfunction, severely dilated left atrium, small pericardial effusion, no atrial level shunt LDL 65 HgbA1c 7.5 VTE prophylaxis - heparin subq No antithrombotic prior to admission, now  on No antithrombotic due to ICH Therapy recommendations:  Home Health PT and Home Health OT Disposition:  pending  Cerebellar Edema Hypertonic saline at 43ml/hr->discontinued 8/30 Na now normal range Close neuro monitoring  CT 8/29 unchanged hematoma  Congestive Heart Failure  Wheezing, improved Wheezing with respiration Daily lasix at home Now on lasix bid Per RN, family reported baseline wheezing CCM did not consider wheezing associated with CHF ENT consulted and no tracheal obstruction  seen  Hypertension Home meds: Coreg, Lasix, hydralazine, spironolactone Stable on the high end BP goal < 160 IV Cleviprex off  On coreg 25 bid, hydralazine 100 Q8h Started on doxazosin 1mg  by renal today  Labetalol 20 mg every 2hr as needed IV hydralazine 10 mg every 4 as needed  AKI on CRF Creatinine trending upwards, 1.65->1.87->2.16->2.41-> 2.78->2.97 D/C IVF, on lasix IV Q12h Discontinue spironolactone Nephrology consult placed, appreciate recommendations  Hyperlipidemia Home meds: Crestor 5, resumed in hospital LDL 65, goal < 70 Now crestor on hold Restart statin at discharge  Diabetes type II Uncontrolled Home meds: Insulin HgbA1c 7.5, goal < 7.0 CBGs SSI On insulin Recommend close follow-up with PCP for better DM control  Other stroke risk factors Advanced age CAD  Hospital day # 5  Patient seen and examined by NP/APP with MD. MD to update note as needed.   Gevena Mart DNP, ACNPC-AG  [facility]  ATTENDING NOTE: I reviewed above note and agree with the assessment and plan. Pt was seen and examined.   RN at the bedside. Pt sitting in chair, initially sleeping but easily arousable, pleasant and neuro unchanged. Wheezing has improved. However, BP still on the high end. Nephrology on board, added doxazosine. Cre continue to be elevated, now on lasix bid. Appreciate nephrology assistance. PT and OT recommend HH.   For detailed assessment and plan, please refer to above/below as I have made changes wherever appropriate.   Marvel Plan, MD PhD Stroke Neurology 05/21/2023 5:01 PM  This patient is critically ill due to cerebellar ICH, cerebellar edema, AKI, hypertensive emergency, CHF and at significant risk of neurological worsening, death form hydrocephalus, brain herniation, renal failure, heart failure. This patient's care requires constant monitoring of vital signs, hemodynamics, respiratory and cardiac monitoring, review of multiple databases,  neurological assessment, discussion with family, other specialists and medical decision making of high complexity. I spent 35 minutes of neurocritical care time in the care of this patient.     To contact Stroke Continuity provider, please refer to [email or web address]. After hours, contact General Neurology

## 2023-05-21 NOTE — Progress Notes (Signed)
OT Cancellation Note  Patient Details Name: Emily Jennings MRN: [id number] DOB: November 10, 1933   Cancelled Treatment:    Reason Eval/Treat Not Completed: Other (comment);Fatigue/lethargy limiting ability to participate. Nsg helped with bath and pt fatigued/asleep. Nsg asking to let pt rest at this time. Will return later time.   Imaan Padgett,HILLARY 05/21/2023, 9:19 AM Luisa Dago, OT/L   Acute OT Clinical Specialist Acute Rehabilitation Services Pager [phone] Office [phone]

## 2023-05-21 NOTE — Progress Notes (Signed)
Occupational Therapy Treatment Patient Details Name: Emily Jennings MRN: [id number] DOB: 1934-02-27 Today's Date: 05/21/2023   History of present illness Pt is a 89 y/o female presenting on 8/28 with dizziness. CT found with nontraumatic intracerebral hemorrhage R cerebellum.  PMH includes: anxiety, breast cancer, carotid stenosis, DM, HTN, osteoporosis, insomnia.   OT comments  Pt making steady progress toward goals. Overall min A with ADL and mobility @ RW level. 3/4 DOE with activity and mobility with VSS on RA. Son present at end of session and states his Mom's SOB is worse since having her stroke. Discussed recommendation for his Mom to have direct physical assistance with all mobility and ADL tasks after DC home. Continue to recommend HHOT and HH Aide. Acute OT to follow.       If plan is discharge home, recommend the following:  A little help with walking and/or transfers;A little help with bathing/dressing/bathroom;Direct supervision/assist for medications management;Direct supervision/assist for financial management;Assist for transportation;Assistance with cooking/housework;Supervision due to cognitive status;Help with stairs or ramp for entrance   Equipment Recommendations  BSC/3in1;Other (comment)    Recommendations for Other Services      Precautions / Restrictions Precautions Precautions: Fall Precaution Comments: SBP 130-150       Mobility Bed Mobility               General bed mobility comments: OOB in chair    Transfers Overall transfer level: Needs assistance   Transfers: Sit to/from Stand Sit to Stand: Min assist                 Balance     Sitting balance-Leahy Scale: Good       Standing balance-Leahy Scale: Poor                             ADL either performed or assessed with clinical judgement   ADL Overall ADL's : Needs assistance/impaired Eating/Feeding: Set up   Grooming: Set up;Supervision/safety   Upper Body  Bathing: Set up;Supervision/ safety   Lower Body Bathing: Minimal assistance;Cueing for safety   Upper Body Dressing : Set up;Supervision/safety;Sitting   Lower Body Dressing: Minimal assistance   Toilet Transfer: Minimal assistance   Toileting- Clothing Manipulation and Hygiene:  (pt has foley)       Functional mobility during ADLs: Minimal assistance;Cueing for safety General ADL Comments: SOB with activity 3/4 DOE; son reports SOB is worse since stroke    Extremity/Trunk Assessment Upper Extremity Assessment Upper Extremity Assessment: RUE deficits/detail RUE Deficits / Details: some decreased sesnation and coordination, grossly  3+/5; usin functionally            Vision   Additional Comments: able to read signs in near and distance vision   Perception     Praxis      Cognition Arousal: Alert Behavior During Therapy: Impulsive Overall Cognitive Status: Impaired/Different from baseline Area of Impairment: Safety/judgement, Awareness                   Current Attention Level: Selective Memory:  (decreased recall of safety information) Following Commands: Follows one step commands consistently Safety/Judgement: Decreased awareness of safety, Decreased awareness of deficits Awareness: Emergent Problem Solving: Slow processing General Comments: Son present adn states his Mom is getting back "closer to herself"; Pt initially trying to slide toward footrest when tryin to get up; cues for safety during sit - stand - pt impulsively trying ot sit before she was  backed up to chair        Exercises      Shoulder Instructions       General Comments      Pertinent Vitals/ Pain       Pain Assessment Pain Assessment: No/denies pain  Home Living                                          Prior Functioning/Environment              Frequency  Min 1X/week        Progress Toward Goals  OT Goals(current goals can now be found in  the care plan section)  Progress towards OT goals: Progressing toward goals  Acute Rehab OT Goals Patient Stated Goal: home OT Goal Formulation: With patient/family Time For Goal Achievement: 06/01/23 Potential to Achieve Goals: Good ADL Goals Pt Will Perform Grooming: with supervision;standing Pt Will Perform Lower Body Dressing: with supervision;sit to/from stand Pt Will Transfer to Toilet: with supervision;ambulating;bedside commode Pt Will Perform Toileting - Clothing Manipulation and hygiene: with modified independence;sit to/from stand Pt Will Perform Tub/Shower Transfer: Tub transfer;with supervision;shower seat Pt/caregiver will Perform Home Exercise Program: Increased strength;Right Upper extremity Additional ADL Goal #1: Pt will complete pill box test with less than 3 errors.  Plan      Co-evaluation                 AM-PAC OT "6 Clicks" Daily Activity     Outcome Measure   Help from another person eating meals?: A Little Help from another person taking care of personal grooming?: A Little Help from another person toileting, which includes using toliet, bedpan, or urinal?: A Little Help from another person bathing (including washing, rinsing, drying)?: A Little Help from another person to put on and taking off regular upper body clothing?: A Little Help from another person to put on and taking off regular lower body clothing?: A Little 6 Click Score: 18    End of Session Equipment Utilized During Treatment: Gait belt;Rolling walker (2 wheels)  OT Visit Diagnosis: Other abnormalities of gait and mobility (R26.89);Muscle weakness (generalized) (M62.81);Other symptoms and signs involving cognitive function   Activity Tolerance Patient tolerated treatment well   Patient Left in chair;with call bell/phone within reach;with chair alarm set;with family/visitor present   Nurse Communication Mobility status        Time: 4098-1191 OT Time Calculation (min): 23  min  Charges: OT General Charges $OT Visit: 1 Visit OT Treatments $Self Care/Home Management : 23-37 mins  Luisa Dago, OT/L   Acute OT Clinical Specialist Acute Rehabilitation Services Pager [phone] Office [phone]   [facility] 05/21/2023, 3:56 PM

## 2023-05-21 NOTE — Plan of Care (Signed)
Pt A/o x 4 (time reorientation), HTN. MD added another antihypertensive agent. Pt ambulates with assistance frequently. Family at the bedside and updated. Bed in low position, alarms are on, call bell in reach, continue to monitor

## 2023-05-21 NOTE — Progress Notes (Signed)
Patient ID: Emily Jennings, female   DOB: September 28, 1933, 89 y.o.   MRN: [id number] [facility] Progress Note   Assessment/ Plan:   1. Acute kidney Injury on chronic kidney disease stage III: Baseline creatinine around 1.6 (followed up by Dr. Wolfgang Phoenix).  Acute injury appears to be likely from a combination of hemodynamically mediated injury in the setting of hypertensive emergency/right cerebellar hemorrhage with fluctuations of renal perfusion along with contrast exposure on 8/28.  She is nonoliguric and does not have any acute indications for dialysis at this time.  Continue supportive management by streamlining blood pressure control and limiting additional nephrotoxin exposure. 2.  Hypertension: Remains with significantly elevated blood pressures in spite of stopping intravenous fluids/her hypertonic saline.  Currently on hydralazine 100 mg 3 times daily along with carvedilol 25 mg twice daily.  Will begin doxazosin 1 mg daily that can be uptitrated based on subsequent blood pressures (with the intention to transition to RAAS blockade when renal function back to baseline).  She is reported as allergic to calcium channel blocker/amlodipine-unclear reaction (or if this was simply pedal edema). 3.  Status post right cerebellar hemorrhage: Repeat imaging showed unchanged bleed with minimal mass effect and vasogenic edema.  Continue efforts at optimizing blood pressure control. 4.  History of congestive heart failure: Continue furosemide for volume management along with blood pressure control.  Subjective:   Denies any acute complaints, somewhat confused and does not recall what kind of reaction she had to amlodipine.   Objective:   BP (!) 152/76   Pulse 85   Temp 98.5 F (36.9 C) (Oral)   Resp (!) 22   Wt 55.4 kg   SpO2 95%   BMI 23.85 kg/m   Intake/Output Summary (Last 24 hours) at 05/21/2023 1032 Last data filed at 05/21/2023 4098 Gross per 24 hour  Intake 660.93 ml  Output 2400 ml   Net -1739.07 ml   Weight change:   Physical Exam: Gen: Comfortably sitting up in recliner, awakens to calling out her name CVS: Pulse regular rhythm, normal rate, S1-S2 normal Resp: Clear to auscultation bilaterally, no distinct rales or rhonchi Abd: Soft, flat, nontender, bowel sounds normal Ext: 1-2+ pitting edema over lower extremities  Imaging: US RENAL  Result Date: 05/20/2023 CLINICAL DATA:  Acute renal insufficiency EXAM: RENAL / URINARY TRACT ULTRASOUND COMPLETE COMPARISON:  02/18/2023 FINDINGS: Right Kidney: Renal measurements: 7.1 x 3.2 x 4.2 cm = volume: 49.9 mL. Increased renal cortical echotexture and renal atrophy consistent with medical renal disease. No hydronephrosis. Subcentimeter simple cyst does not require imaging follow-up. Left Kidney: Renal measurements: 10.6 x 3.7 x 5.0 cm = volume: 102.9 mL. Increased renal cortical echotexture consistent with medical renal disease. Simple left renal cysts again seen, largest in the upper pole measuring 2.8 cm. No specific imaging follow-up is required. 11 mm calcification upper pole left kidney. No hydronephrosis. Bladder: Bladder is minimally distended, limiting its evaluation. No gross abnormality. Other: Incidental small left pleural effusion. IMPRESSION: 1. Bilateral increased renal cortical echotexture consistent with medical renal disease. 2. Nonobstructing 11 mm left renal calculus.  No hydronephrosis. 3. Simple bilateral renal cortical cysts do not require imaging follow-up. Electronically Signed   By: Sharlet Salina M.D.   On: 05/20/2023 19:53   CT HEAD WO CONTRAST ( )  Result Date: 05/20/2023 CLINICAL DATA:  89 year old female with right cerebellar hemorrhage. EXAM: CT HEAD WITHOUT CONTRAST TECHNIQUE: Contiguous axial images were obtained from the base of the skull through the vertex without intravenous contrast.  RADIATION DOSE REDUCTION: This exam was performed according to the departmental dose-optimization program which  includes automated exposure control, adjustment of the mA and/or kV according to patient size and/or use of iterative reconstruction technique. COMPARISON:  Head CTs 05/17/2023 and earlier. FINDINGS: Brain: Hyperdense central right cerebellar hemorrhage now is 25 x 23 x 11 mm, stable since 05/17/2023. Regional edema remain stable. Partially effaced but patent 4th ventricle. And basilar cisterns remain patent. Stable gray-white differentiation elsewhere. No supratentorial midline shift or ventriculomegaly. No new intracranial hemorrhage. Vascular: Calcified atherosclerosis at the skull base. No suspicious intracranial vascular hyperdensity. Skull: Stable and intact. Sinuses/Orbits: Chronic right sphenoid sinusitis with mucoperiosteal thickening. Other Visualized paranasal sinuses and mastoids are stable and well aerated. Other: No acute orbit or scalp soft tissue finding. IMPRESSION: 1. Right cerebellar hemorrhage unchanged since 05/17/2023. Stable edema with no significant intracranial mass effect or complicating features. 2. No new intracranial abnormality. Electronically Signed   By: Odessa Fleming M.D.   On: 05/20/2023 12:55    Labs: BMET Recent Labs  Lab 05/16/23 1727 05/16/23 1956 05/17/23 0717 05/17/23 1626 05/17/23 2028 05/18/23 0508 05/18/23 0800 05/19/23 0429 05/20/23 0403 05/21/23 0400  NA 133*   < > 143 147* 149* 149* 150* 143 138 137  K 3.3*  --  3.1*  --   --  3.7  --  3.8 4.2 3.9  CL 97*  --  107  --   --  117*  --  111 110 107  CO2 27  --  22  --   --  20*  --  20* 17* 19*  GLUCOSE 188*  --  215*  --   --  158*  --  131* 169* 218*  BUN 31*  --  33*  --   --  36*  --  43* 49* 58*  CREATININE 1.65*  --  1.87*  --   --  2.16*  --  2.41* 2.78* 2.97*  CALCIUM 8.1*  --  8.2*  --   --  8.3*  --  8.9 8.3* 8.2*   < > = values in this interval not displayed.   CBC Recent Labs  Lab 05/16/23 1727 05/19/23 0429 05/20/23 0403 05/21/23 0400  WBC 5.5 12.0* 9.8 7.9  NEUTROABS 3.8  --   --    --   HGB 10.8* 10.5* 9.6* 10.2*  HCT 33.5* 33.1* 29.9* 33.1*  MCV 95.2 98.2 94.6 97.6  PLT 170 195 157 157    Medications:     carvedilol  25 mg Oral BID WC   Chlorhexidine Gluconate Cloth  6 each Topical Q0600   furosemide  40 mg Intravenous Q12H   heparin injection (subcutaneous)  5,000 Units Subcutaneous Q8H   hydrALAZINE  100 mg Oral TID   insulin aspart  0-20 Units Subcutaneous Q4H   insulin glargine-yfgn  5 Units Subcutaneous Daily   senna-docusate  1 tablet Oral BID   Zetta Bills, MD 05/21/2023, 10:32 AM

## 2023-05-22 ENCOUNTER — Encounter: Payer: Self-pay | Admitting: Internal Medicine

## 2023-05-22 DIAGNOSIS — I614 Nontraumatic intracerebral hemorrhage in cerebellum: Secondary | ICD-10-CM | POA: Diagnosis not present

## 2023-05-22 LAB — CBC
HCT: 31.1 % — ABNORMAL LOW (ref 36.0–46.0)
Hemoglobin: 10 g/dL — ABNORMAL LOW (ref 12.0–15.0)
MCH: 31.3 pg (ref 26.0–34.0)
MCHC: 32.2 g/dL (ref 30.0–36.0)
MCV: 97.2 fL (ref 80.0–100.0)
Platelets: 143 10*3/uL — ABNORMAL LOW (ref 150–400)
RBC: 3.2 MIL/uL — ABNORMAL LOW (ref 3.87–5.11)
RDW: 14.7 % (ref 11.5–15.5)
WBC: 6.1 10*3/uL (ref 4.0–10.5)
nRBC: 0 % (ref 0.0–0.2)

## 2023-05-22 LAB — BASIC METABOLIC PANEL
Anion gap: 11 (ref 5–15)
BUN: 58 mg/dL — ABNORMAL HIGH (ref 8–23)
CO2: 23 mmol/L (ref 22–32)
Calcium: 8.1 mg/dL — ABNORMAL LOW (ref 8.9–10.3)
Chloride: 105 mmol/L (ref 98–111)
Creatinine, Ser: 2.79 mg/dL — ABNORMAL HIGH (ref 0.44–1.00)
GFR, Estimated: 16 mL/min — ABNORMAL LOW (ref >60)
Glucose, Bld: 196 mg/dL — ABNORMAL HIGH (ref 70–99)
Potassium: 2.9 mmol/L — ABNORMAL LOW (ref 3.5–5.1)
Sodium: 139 mmol/L (ref 135–145)

## 2023-05-22 LAB — GLUCOSE, CAPILLARY
Glucose-Capillary: 107 mg/dL — ABNORMAL HIGH (ref 70–99)
Glucose-Capillary: 108 mg/dL — ABNORMAL HIGH (ref 70–99)
Glucose-Capillary: 148 mg/dL — ABNORMAL HIGH (ref 70–99)
Glucose-Capillary: 175 mg/dL — ABNORMAL HIGH (ref 70–99)
Glucose-Capillary: 196 mg/dL — ABNORMAL HIGH (ref 70–99)
Glucose-Capillary: 325 mg/dL — ABNORMAL HIGH (ref 70–99)
Glucose-Capillary: 92 mg/dL (ref 70–99)

## 2023-05-22 MED ORDER — DOXAZOSIN MESYLATE 2 MG PO TABS
2.0000 mg | ORAL_TABLET | Freq: Every day | ORAL | Status: DC
  Administered 2023-05-22 – 2023-05-23 (×2): 2 mg via ORAL
  Filled 2023-05-22 (×3): qty 1

## 2023-05-22 MED ORDER — INSULIN ASPART 100 UNIT/ML IJ SOLN
0.0000 [IU] | Freq: Three times a day (TID) | INTRAMUSCULAR | Status: DC
  Administered 2023-05-23: 5 [IU] via SUBCUTANEOUS
  Administered 2023-05-23 (×2): 3 [IU] via SUBCUTANEOUS
  Administered 2023-05-24: 7 [IU] via SUBCUTANEOUS
  Administered 2023-05-24: 5 [IU] via SUBCUTANEOUS
  Administered 2023-05-24: 9 [IU] via SUBCUTANEOUS
  Administered 2023-05-25: 5 [IU] via SUBCUTANEOUS
  Administered 2023-05-25: 2 [IU] via SUBCUTANEOUS
  Administered 2023-05-25 – 2023-05-26 (×2): 1 [IU] via SUBCUTANEOUS
  Administered 2023-05-26: 9 [IU] via SUBCUTANEOUS
  Administered 2023-05-26: 3 [IU] via SUBCUTANEOUS
  Administered 2023-05-27: 5 [IU] via SUBCUTANEOUS
  Administered 2023-05-27: 2 [IU] via SUBCUTANEOUS
  Administered 2023-05-27: 3 [IU] via SUBCUTANEOUS
  Administered 2023-05-28: 5 [IU] via SUBCUTANEOUS
  Administered 2023-05-28: 2 [IU] via SUBCUTANEOUS

## 2023-05-22 MED ORDER — INSULIN ASPART 100 UNIT/ML IJ SOLN
2.0000 [IU] | Freq: Three times a day (TID) | INTRAMUSCULAR | Status: DC
  Administered 2023-05-22 – 2023-05-28 (×18): 2 [IU] via SUBCUTANEOUS

## 2023-05-22 MED ORDER — ROSUVASTATIN CALCIUM 5 MG PO TABS
5.0000 mg | ORAL_TABLET | Freq: Every day | ORAL | Status: DC
  Administered 2023-05-22 – 2023-05-28 (×7): 5 mg via ORAL
  Filled 2023-05-22 (×7): qty 1

## 2023-05-22 MED ORDER — POTASSIUM CHLORIDE CRYS ER 20 MEQ PO TBCR
40.0000 meq | EXTENDED_RELEASE_TABLET | ORAL | Status: AC
  Administered 2023-05-22 (×2): 40 meq via ORAL
  Filled 2023-05-22 (×2): qty 2

## 2023-05-22 MED ORDER — INSULIN ASPART 100 UNIT/ML IJ SOLN: 0.0000 [IU] | Freq: Every day | INTRAMUSCULAR | Status: DC

## 2023-05-22 NOTE — TOC Progression Note (Signed)
Transition of Care [facility]) - Progression Note    Patient Details  Name: Emily Jennings MRN: [id number] Date of Birth: 14-Feb-1934  Transition of Care [facility]) CM/SW Contact  Kermit Balo, RN Phone Number: 05/22/2023, 4:12 PM  Clinical Narrative:    CM spoke to patient and she is in agreement with home health services. She has no preference. HH arranged with [facility]. Information on the AVS.  Pt has been managing her own medications at home. CM has asked her spouse to oversee these at home and he voiced understanding.  Spouse provides needed transportation. TOC following.   Expected Discharge Plan: Home w Home Health Services Barriers to Discharge: Continued Medical Work up  Expected Discharge Plan and Services   Discharge Planning Services: CM Consult   Living arrangements for the past 2 months: Single Family Home                                       Social Determinants of Health (SDOH) Interventions SDOH Screenings   Food Insecurity: No Food Insecurity (02/17/2023)  Housing: Low Risk  (02/17/2023)  Transportation Needs: No Transportation Needs (02/17/2023)  Utilities: Not At Risk (02/17/2023)  Alcohol Screen: Low Risk  (11/15/2022)  Depression (PHQ2-9): Low Risk  (05/16/2023)  Financial Resource Strain: Low Risk  (12/28/2022)  Physical Activity: Sufficiently Active (11/15/2022)  Social Connections: Moderately Integrated (11/15/2022)  Stress: No Stress Concern Present (12/28/2022)  Tobacco Use: Low Risk  (05/16/2023)    Readmission Risk Interventions    02/19/2023    8:06 AM  Readmission Risk Prevention Plan  Transportation Screening Complete  HRI or Home Care Consult Complete  Social Work Consult for Recovery Care Planning/Counseling Complete  Palliative Care Screening Not Applicable  Medication Review [employer]) Complete

## 2023-05-22 NOTE — Plan of Care (Signed)
  Problem: Intracerebral Hemorrhage Tissue Perfusion: Goal: Complications of Intracerebral Hemorrhage will be minimized Outcome: Progressing   Problem: Coping: Goal: Will verbalize positive feelings about self Outcome: Progressing   Problem: Self-Care: Goal: Ability to participate in self-care as condition permits will improve Outcome: Progressing   Problem: Nutrition: Goal: Risk of aspiration will decrease Outcome: Progressing Goal: Dietary intake will improve Outcome: Progressing

## 2023-05-22 NOTE — Telephone Encounter (Signed)
I have left multiple vm and have been unable to reach patient.

## 2023-05-22 NOTE — Progress Notes (Signed)
Patient ID: Emily Jennings, female   DOB: 01-21-1934, 89 y.o.   MRN: [id number] [facility] Progress Note   Assessment/ Plan:   1. Acute kidney Injury on chronic kidney disease stage III: Baseline creatinine around 1.6 (followed up by Dr. Wolfgang Phoenix).  Acute injury appears to be likely from a combination of hemodynamically mediated injury in the setting of hypertensive emergency/right cerebellar hemorrhage with fluctuations of renal perfusion along with contrast exposure on 8/28.  She is nonoliguric and labs are pending from this morning. 2.  Hypertension: Blood pressures continue to remain elevated, will increase doxazosin to 2 mg daily.  Amlodipine listed as allergy with "swelling"-suspect this might have been dose related pedal edema. 3.  Status post right cerebellar hemorrhage: Repeat imaging showed unchanged bleed with minimal mass effect and vasogenic edema.  Continue efforts at optimizing blood pressure control. 4.  History of congestive heart failure: Continue furosemide for volume unloading after hypertonic saline administration for intracranial bleed along with blood pressure control.  Subjective:   Transferred to telemetry unit overnight from neuro ICU.  Confused   Objective:   BP (!) 186/71 (BP Location: Right Arm)   Pulse 73   Temp 98.5 F (36.9 C) (Oral)   Resp 15   Wt 55.4 kg   SpO2 97%   BMI 23.85 kg/m   Intake/Output Summary (Last 24 hours) at 05/22/2023 0900 Last data filed at 05/22/2023 0223 Gross per 24 hour  Intake 900 ml  Output 1530 ml  Net -630 ml   Weight change:   Physical Exam: Gen: Somewhat confused, appears comfortable CVS: Pulse regular rhythm, normal rate, S1-S2 normal Resp: Clear to auscultation bilaterally, no distinct rales or rhonchi Abd: Soft, flat, nontender, bowel sounds normal Ext: 1-2+ pitting edema over lower extremities  Imaging: US RENAL  Result Date: 05/20/2023 CLINICAL DATA:  Acute renal insufficiency EXAM: RENAL / URINARY  TRACT ULTRASOUND COMPLETE COMPARISON:  02/18/2023 FINDINGS: Right Kidney: Renal measurements: 7.1 x 3.2 x 4.2 cm = volume: 49.9 mL. Increased renal cortical echotexture and renal atrophy consistent with medical renal disease. No hydronephrosis. Subcentimeter simple cyst does not require imaging follow-up. Left Kidney: Renal measurements: 10.6 x 3.7 x 5.0 cm = volume: 102.9 mL. Increased renal cortical echotexture consistent with medical renal disease. Simple left renal cysts again seen, largest in the upper pole measuring 2.8 cm. No specific imaging follow-up is required. 11 mm calcification upper pole left kidney. No hydronephrosis. Bladder: Bladder is minimally distended, limiting its evaluation. No gross abnormality. Other: Incidental small left pleural effusion. IMPRESSION: 1. Bilateral increased renal cortical echotexture consistent with medical renal disease. 2. Nonobstructing 11 mm left renal calculus.  No hydronephrosis. 3. Simple bilateral renal cortical cysts do not require imaging follow-up. Electronically Signed   By: Sharlet Salina M.D.   On: 05/20/2023 19:53   CT HEAD WO CONTRAST ( )  Result Date: 05/20/2023 CLINICAL DATA:  89 year old female with right cerebellar hemorrhage. EXAM: CT HEAD WITHOUT CONTRAST TECHNIQUE: Contiguous axial images were obtained from the base of the skull through the vertex without intravenous contrast. RADIATION DOSE REDUCTION: This exam was performed according to the departmental dose-optimization program which includes automated exposure control, adjustment of the mA and/or kV according to patient size and/or use of iterative reconstruction technique. COMPARISON:  Head CTs 05/17/2023 and earlier. FINDINGS: Brain: Hyperdense central right cerebellar hemorrhage now is 25 x 23 x 11 mm, stable since 05/17/2023. Regional edema remain stable. Partially effaced but patent 4th ventricle. And basilar cisterns remain patent.  Stable gray-white differentiation elsewhere. No  supratentorial midline shift or ventriculomegaly. No new intracranial hemorrhage. Vascular: Calcified atherosclerosis at the skull base. No suspicious intracranial vascular hyperdensity. Skull: Stable and intact. Sinuses/Orbits: Chronic right sphenoid sinusitis with mucoperiosteal thickening. Other Visualized paranasal sinuses and mastoids are stable and well aerated. Other: No acute orbit or scalp soft tissue finding. IMPRESSION: 1. Right cerebellar hemorrhage unchanged since 05/17/2023. Stable edema with no significant intracranial mass effect or complicating features. 2. No new intracranial abnormality. Electronically Signed   By: Odessa Fleming M.D.   On: 05/20/2023 12:55    Labs: BMET Recent Labs  Lab 05/16/23 1727 05/16/23 1956 05/17/23 0717 05/17/23 1626 05/17/23 2028 05/18/23 0508 05/18/23 0800 05/19/23 0429 05/20/23 0403 05/21/23 0400  NA 133*   < > 143 147* 149* 149* 150* 143 138 137  K 3.3*  --  3.1*  --   --  3.7  --  3.8 4.2 3.9  CL 97*  --  107  --   --  117*  --  111 110 107  CO2 27  --  22  --   --  20*  --  20* 17* 19*  GLUCOSE 188*  --  215*  --   --  158*  --  131* 169* 218*  BUN 31*  --  33*  --   --  36*  --  43* 49* 58*  CREATININE 1.65*  --  1.87*  --   --  2.16*  --  2.41* 2.78* 2.97*  CALCIUM 8.1*  --  8.2*  --   --  8.3*  --  8.9 8.3* 8.2*   < > = values in this interval not displayed.   CBC Recent Labs  Lab 05/16/23 1727 05/19/23 0429 05/20/23 0403 05/21/23 0400 05/22/23 0736  WBC 5.5 12.0* 9.8 7.9 6.1  NEUTROABS 3.8  --   --   --   --   HGB 10.8* 10.5* 9.6* 10.2* 10.0*  HCT 33.5* 33.1* 29.9* 33.1* 31.1*  MCV 95.2 98.2 94.6 97.6 97.2  PLT 170 195 157 157 143*    Medications:     carvedilol  25 mg Oral BID WC   Chlorhexidine Gluconate Cloth  6 each Topical Q0600   doxazosin  2 mg Oral Daily   furosemide  40 mg Intravenous Q12H   heparin injection (subcutaneous)  5,000 Units Subcutaneous Q8H   hydrALAZINE  100 mg Oral TID   insulin aspart  0-20  Units Subcutaneous Q4H   insulin glargine-yfgn  5 Units Subcutaneous Daily   senna-docusate  1 tablet Oral BID   Zetta Bills, MD 05/22/2023, 9:00 AM

## 2023-05-22 NOTE — Telephone Encounter (Signed)
Letter Sent.

## 2023-05-22 NOTE — Progress Notes (Signed)
STROKE TEAM PROGRESS NOTE   BRIEF HPI [name] is a 89 y.o. female with history of  hypertension, coronary artery disease, hyperglycemia, diabetes, who presents with dizziness that started yesterday.  She states that she mainly just feels off balance, but also notes she was having some difficulty coordinating her right side.  Due to this not getting better today, she is all care in emergency department where she was seen to have a right cerebellar hemorrhage.  She has been admitted for further management.  She was very hypertensive in the emergency department and started on a Cleviprex drip.   SIGNIFICANT HOSPITAL EVENTS 8/28-transferred to [facility] for ICU care 9/2 transfer to floor  INTERIM HISTORY/SUBJECTIVE No family at the bedside. PT is working with her. Pt comfortable, not in distress, wheezing much improved. Cre also improving. BP still high, doxazosin increased to 2mg   OBJECTIVE  CBC    Component Value Date/Time   WBC 6.1 05/22/2023 0736   RBC 3.20 (L) 05/22/2023 0736   HGB 10.0 (L) 05/22/2023 0736   HGB 11.3 02/27/2023 1400   HCT 31.1 (L) 05/22/2023 0736   HCT 35.8 02/27/2023 1400   PLT 143 (L) 05/22/2023 0736   PLT 301 02/27/2023 1400   MCV 97.2 05/22/2023 0736   MCV 92 02/27/2023 1400   MCH 31.3 05/22/2023 0736   MCHC 32.2 05/22/2023 0736   RDW 14.7 05/22/2023 0736   RDW 13.2 02/27/2023 1400   LYMPHSABS 0.9 05/16/2023 1727   LYMPHSABS 1.0 09/05/2022 1144   MONOABS 0.6 05/16/2023 1727   EOSABS 0.1 05/16/2023 1727   EOSABS 0.2 09/05/2022 1144   BASOSABS 0.0 05/16/2023 1727   BASOSABS 0.1 09/05/2022 1144    BMET    Component Value Date/Time   NA 139 05/22/2023 0736   NA 136 02/27/2023 1400   K 2.9 (L) 05/22/2023 0736   CL 105 05/22/2023 0736   CO2 23 05/22/2023 0736   GLUCOSE 196 (H) 05/22/2023 0736   BUN 58 (H) 05/22/2023 0736   BUN 29 (H) 02/27/2023 1400   CREATININE 2.79 (H) 05/22/2023 0736   CREATININE 1.70 (H) 02/01/2023 1356    CALCIUM 8.1 (L) 05/22/2023 0736   EGFR 26 (L) 02/27/2023 1400   GFRNONAA 16 (L) 05/22/2023 0736   GFRNONAA 40 (L) 06/29/2020 0916    IMAGING past 24 hours No results found.  Vitals:   05/22/23 0324 05/22/23 0507 05/22/23 0600 05/22/23 0836  BP: (!) 191/68 (!) 188/72 (!) 167/64 (!) 186/71  Pulse: 83  85 73  Resp: 16   15  Temp: 98.5 F (36.9 C)   98.5 F (36.9 C)  TempSrc: Oral   Oral  SpO2: 95%   97%  Weight:         PHYSICAL EXAM General:  Alert, well-nourished, well-developed pleasant elderly African-American lady in no acute distress Psych:  Mood and affect appropriate for situation CV: Regular rate and rhythm on monitor Respiratory:  Regular, unlabored respirations on room air GI: Abdomen soft and nontender  NEURO:  Mental Status: She is drowsy, awakens easily. AA&Ox3, patient is able to give clear and coherent history Speech/Language: speech is mildly dysarthric. no aphasia.  Naming, repetition, fluency, and comprehension intact.  Cranial Nerves:  II: PERRL. Visual fields full.  III, IV, VI: EOMI. Eyelids elevate symmetrically.  V: Sensation is intact to light touch and symmetrical to face.  VII: Face is symmetrical resting and smiling VIII: hearing intact to voice. IX, X: Voice is slightly dysarthric  XII: tongue is midline without fasciculations. Motor: 5/5 strength to all muscle groups tested.  Tone: is normal and bulk is normal Sensation- Intact to light touch bilaterally. Extinction absent to light touch to DSS.   Coordination: FTN with very mild right-sided ataxia, HKS: no ataxia in BLE.No drift.  Gait- deferred   ASSESSMENT/PLAN  Intraparenchymal Hemorrhage:  Right cerebellar hemorrhage, etiology: likely Hypertensive  CT head Intraparenchymal hematoma within the right cerebellum measuring 2.1 x 2.0 x 1.0 cm with mild surrounding edema.  CTA head and neck - No emergent large vessel occlusion or high-grade stenosis of the intracranial arteries. No  aneurysm or vascular malformation.   Repeat CT Head - Right cerebellar hemorrhage is stable to perhaps 1 mL larger since yesterday (3-4 mL overall). Surrounding edema, but no significant posterior fossa mass effect, and no other complicating features. MRI redemonstrated right cerebellar ICH grossly unchanged compared to last CT, with vasogenic edema and minimal mass effect on the fourth ventricle, no hydrocephalus Repeat CT head 9/1: Stable right cerebellar hemorrhage and no acute changes 2D Echo EF 65 to 70%, grade 2 diastolic dysfunction, severely dilated left atrium, small pericardial effusion, no atrial level shunt LDL 65 HgbA1c 7.5 VTE prophylaxis - heparin subq No antithrombotic prior to admission, now on No antithrombotic due to ICH Therapy recommendations:  Home Health PT and Home Health OT Disposition:  pending  Cerebellar Edema Hypertonic saline at 15ml/hr->discontinued 8/30 Na now normal range Close neuro monitoring  CT 8/29 and 9/1 unchanged hematoma, no hydro  Congestive Heart Failure  Wheezing, improved Wheezing with expiration, much improved Daily lasix at home Now on lasix bid Per RN, family reported baseline wheezing CCM did not consider wheezing associated with CHF ENT consulted and no tracheal obstruction seen  Hypertension Home meds: Coreg, Lasix, hydralazine, spironolactone Stable on the high end BP goal < 160 IV Cleviprex off  On coreg 25 bid, hydralazine 100 Q8h Started on doxazosin 1->2mg  by renal today  Labetalol 20 mg every 2hr as needed IV hydralazine 10 mg every 4 as needed  AKI on CRF Creatinine trending upwards, 1.65->1.87->2.16->2.41-> 2.78->2.97->2.79 D/C IVF, on lasix IV Q12h Discontinue spironolactone Nephrology consult placed, appreciate recommendations  Hyperlipidemia Home meds: Crestor 5  LDL 65, goal < 70 Now crestor resumed Continue statin at discharge  Diabetes type II Uncontrolled Hypoglycemic episode 9/2 Home meds:  Insulin HgbA1c 7.5, goal < 7.0 CBGs SSI On insulin, decreased dosing Recommend close follow-up with PCP for better DM control  Other stroke risk factors Advanced age CAD  Hospital day # 6  Marvel Plan, MD PhD Stroke Neurology 05/22/2023 11:53 AM      To contact Stroke Continuity provider, please refer to [email or web address]. After hours, contact General Neurology

## 2023-05-22 NOTE — Inpatient Diabetes Management (Signed)
Inpatient Diabetes Program Recommendations  AACE/ADA: New Consensus Statement on Inpatient Glycemic Control (2015)  Target Ranges:  Prepandial:   less than 140 mg/dL      Peak postprandial:   less than 180 mg/dL (1-2 hours)      Critically ill patients:  140 - 180 mg/dL   Lab Results  Component Value Date   GLUCAP 175 (H) 05/22/2023   HGBA1C 7.5 (A) 05/01/2023    Review of Glycemic Control  Latest Reference Range & Units 05/21/23 19:54 05/21/23 23:30 05/22/23 00:02 05/22/23 03:34 05/22/23 08:34  Glucose-Capillary 70 - 99 mg/dL 409 (H) 52 (L) 92 811 (H) 175 (H)   Diabetes history: DM 2 Outpatient Diabetes medications:  Humalog 5 units tid with meals, Tresiba 5 units daily Current orders for Inpatient glycemic control:  Novolog 0-20 units q 4 hours Semglee 5 units daily  Inpatient Diabetes Program Recommendations:   Note mild low overnight with q 4 hour coverage.  Consider reducing Novolog correction to sensitive tid with meals and HS (currently ordered q 4 hour correction/resistant) and add Novolog meal coverage 3 units tid with meals (hold if patient eats less than 50% or NPO). May need a little more basal but would wait until tomorrow.   Thanks,  Beryl Meager, RN, BC-ADM Inpatient Diabetes Coordinator Pager [phone]  (8a-5p)

## 2023-05-22 NOTE — Progress Notes (Signed)
   05/21/23 2326  Assess: MEWS Score  Temp 98.6 F (37 C)  BP (!) 160/73  MAP (mmHg) 101  Pulse Rate 68  Resp 16  Level of Consciousness Alert  SpO2 95 %  O2 Device Room Air  Patient Activity (if Appropriate) In bed  Assess: MEWS Score  MEWS Temp 0  MEWS Systolic 0  MEWS Pulse 0  MEWS RR 0  MEWS LOC 0  MEWS Score 0  MEWS Score Color Green  Assess: if the MEWS score is Yellow or Red  Were vital signs accurate and taken at a resting state? Yes  Provider Notification  Notification Reason Critical Result (Glucose 52, juice given, hypoglycemic protocol ordered and followed, new cbg = 92)  Assess: SIRS CRITERIA  SIRS Temperature  0  SIRS Pulse 0  SIRS Respirations  0  SIRS WBC 0  SIRS Score Sum  0

## 2023-05-22 NOTE — Progress Notes (Signed)
Physical Therapy Treatment Patient Details Name: Emily HERBST MRN: [id number] DOB: 05-Nov-1933 Today's Date: 05/22/2023   History of Present Illness Pt is a 89 y/o female presenting on 8/28 with dizziness. CT found with nontraumatic intracerebral hemorrhage R cerebellum.  PMH includes: anxiety, breast cancer, carotid stenosis, DM, HTN, osteoporosis, insomnia.    PT Comments  Patient is agreeable to PT session. She is eager to walk and expressed she wants to return home when able. Min A for bed mobility and initial standing bout. Subsequent standing bouts with only contact guard assistance. The patient ambulated 3 bouts in the room with rolling walker with contact guard assistance for safety. Energy conservation techniques discussed as patient is fatigued with activity and required frequent rest breaks with mobility efforts. Recommend to continue PT to maximize independence and to decrease caregiver burden.    If plan is discharge home, recommend the following: A little help with walking and/or transfers;A little help with bathing/dressing/bathroom;Assist for transportation;Direct supervision/assist for financial management;Direct supervision/assist for medications management;Assistance with cooking/housework;Help with stairs or ramp for entrance   Can travel by private vehicle        Equipment Recommendations  None recommended by PT    Recommendations for Other Services       Precautions / Restrictions Precautions Precautions: Fall Precaution Comments: BP goal < 160 per neurology Restrictions Weight Bearing Restrictions: No     Mobility  Bed Mobility Overal bed mobility: Needs Assistance Bed Mobility: Supine to Sit     Supine to sit: Min assist     General bed mobility comments: assistance for scooting hips towards edge of bed in preparation for standing. verbal cues for technique    Transfers Overall transfer level: Needs assistance Equipment used: Rolling walker (2  wheels) Transfers: Sit to/from Stand Sit to Stand: Min assist, Contact guard assist           General transfer comment: 4 standing bouts performed. patient required power up assistance for the first stand and only CGA for further standing bouts. cues for hand placement for safety    Ambulation/Gait Ambulation/Gait assistance: Contact guard assist Gait Distance (Feet):  (23ft, 39ft, 45ft) Assistive device: Rolling walker (2 wheels) Gait Pattern/deviations: Step-through pattern, Narrow base of support, Trunk flexed Gait velocity: decreased     General Gait Details: verbal cues for technique. CGA for safety. patient fatigues quickly and seated rest breaks required. energy conservation strategies discussed to use in home setting   Stairs             Wheelchair Mobility     Tilt Bed    Modified Rankin (Stroke Patients Only)       Balance Overall balance assessment: Needs assistance Sitting-balance support: No upper extremity supported, Feet supported Sitting balance-Leahy Scale: Good     Standing balance support: Bilateral upper extremity supported, No upper extremity supported, During functional activity Standing balance-Leahy Scale: Poor Standing balance comment: heavy reliance on rolling walker support. no external support required from therapist to maintain static standing, CGA provided with dynamic activity                            Cognition Arousal: Alert Behavior During Therapy: WFL for tasks assessed/performed Overall Cognitive Status: Impaired/Different from baseline Area of Impairment: Safety/judgement, Awareness, Memory, Following commands                     Memory: Decreased short-term memory Following Commands: Follows one  step commands consistently, Follows multi-step commands inconsistently Safety/Judgement: Decreased awareness of safety, Decreased awareness of deficits Awareness: Emergent Problem Solving: Slow processing           Exercises      General Comments General comments (skin integrity, edema, etc.): blood pressure 161/65 prior to mobilizing with medications given prior to PT session per discussion with nurse. no family in the room during session. the patient requests to return home and reports her spouse will be able to assist at home if needed. she also reports her son lives there as well      Pertinent Vitals/Pain Pain Assessment Pain Assessment: No/denies pain    Home Living                          Prior Function            PT Goals (current goals can now be found in the care plan section) Acute Rehab PT Goals Patient Stated Goal: to walk and to go home PT Goal Formulation: With patient Time For Goal Achievement: 05/31/23 Potential to Achieve Goals: Good Progress towards PT goals: Progressing toward goals    Frequency    Min 1X/week      PT Plan      Co-evaluation              AM-PAC PT "6 Clicks" Mobility   Outcome Measure  Help needed turning from your back to your side while in a flat bed without using bedrails?: A Little Help needed moving from lying on your back to sitting on the side of a flat bed without using bedrails?: A Little Help needed moving to and from a bed to a chair (including a wheelchair)?: A Little Help needed standing up from a chair using your arms (e.g., wheelchair or bedside chair)?: A Little Help needed to walk in hospital room?: A Little Help needed climbing 3-5 steps with a railing? : A Lot 6 Click Score: 17    End of Session Equipment Utilized During Treatment: Gait belt Activity Tolerance: Patient tolerated treatment well Patient left: in chair;with call bell/phone within reach;with chair alarm set Nurse Communication: Mobility status PT Visit Diagnosis: Unsteadiness on feet (R26.81);Difficulty in walking, not elsewhere classified (R26.2)     Time: 8676-1950 PT Time Calculation (min) (ACUTE ONLY): 25  min  Charges:    $Therapeutic Activity: 23-37 mins PT General Charges $$ ACUTE PT VISIT: 1 Visit                     Donna Bernard, PT, MPT   Ina Homes 05/22/2023, 1:45 PM

## 2023-05-22 NOTE — Progress Notes (Addendum)
PROGRESS NOTE    Emily Jennings  BJY:[id number] DOB: 1934/03/23 DOA: 05/16/2023 PCP: [name], MD   Brief Narrative:  [name] is a 89 y.o. female with history of  hypertension, coronary artery disease, hyperglycemia, diabetes, who presented with dizziness.  She was diagnosed with right cerebellar hemorrhage in the ED.  She was very hypertensive in the emergency department and started on a Cleviprex drip and admitted under neurology service.  Transferred under [facility] on 05/22/2023.  Assessment & Plan:   Principal Problem:   ICH (intracerebral hemorrhage) (HCC) Active Problems:   DNR (do not resuscitate)   Vocal cord dysfunction   DNR (do not resuscitate) discussion   Goals of care, counseling/discussion  Intraparenchymal Hemorrhage:  Right cerebellar hemorrhage, etiology: likely Hypertensive : CT head Intraparenchymal hematoma within the right cerebellum measuring 2.1 x 2.0 x 1.0 cm with mild surrounding edema. CTA head and neck - No emergent large vessel occlusion or high-grade stenosis of the intracranial arteries. No aneurysm or vascular malformation.   Repeat CT Head - Right cerebellar hemorrhage is stable to perhaps 1 mL larger since yesterday (3-4 mL overall). Surrounding edema, but no significant posterior fossa mass effect, and no other complicating features. MRI redemonstrated right cerebellar ICH grossly unchanged compared to last CT, with vasogenic edema and minimal mass effect on the fourth ventricle, no hydrocephalus Repeat CT head 9/1: Stable right cerebellar hemorrhage and no acute changes 2D Echo EF 65 to 70%, grade 2 diastolic dysfunction, severely dilated left atrium, small pericardial effusion, no atrial level shunt LDL 65 HgbA1c 7.5 VTE prophylaxis - heparin subq No antithrombotic prior to admission, now on No antithrombotic due to ICH Therapy recommendations:  Home Health PT and Home Health OT.    Cerebellar Edema Hypertonic saline at  6ml/hr->discontinued 8/30 Na now normal range CT 8/29 unchanged hematoma   Acute on chronic diastolic congestive Heart Failure: Daily lasix at home Now on lasix IV bid Per RN, family reported baseline wheezing but she had no wheezing on my examination today. ENT consulted and no tracheal obstruction seen.   Hypertension Home meds: Coreg, Lasix, hydralazine, spironolactone but currently she is on Coreg 25 twice daily, hydralazine 100 mg 3 times daily and doxazosin 1 mg p.o. daily. BP goal < 160 but blood pressure still elevated so nephrology has increased doxazosin to 2 mg. Labetalol 20 mg every 2hr as needed IV hydralazine 10 mg every 4 as needed   AKI on CKD stage IIIb: Baseline creatinine 1.4.  Creatinine peaked at 2.97 yesterday, slight improvement today at 2.79, she is nonoliguric.  Nephrology following.  No indication of dialysis.     Hyperlipidemia Home meds: Crestor 5, resumed in hospital LDL 65, goal < 70 Now crestor on hold Restart statin at discharge   Diabetes type II Uncontrolled Home meds: Insulin HgbA1c 7.5, goal < 7.0.  Looks like she had hypoglycemia with blood sugar of 52 yesterday, her Semglee was reduced to 5 units.  Continue SSI.  Hypokalemia: Will replenish.  DVT prophylaxis: heparin injection 5,000 Units Start: 05/18/23 1400 SCD's Start: 05/16/23 2304   Code Status: Limited: Do not attempt resuscitation (DNR) -DNR-LIMITED -Do Not Intubate/DNI   Family Communication:  None present at bedside.  Plan of care discussed with patient in length and he/she verbalized understanding and agreed with it.  Status is: Inpatient Remains inpatient appropriate because: Patient's blood pressure still elevated and needs medication adjustment.   Estimated body mass index is 23.85 kg/m as calculated from  the following:   Height as of an earlier encounter on 05/16/23: 5' (1.524 m).   Weight as of this encounter: 55.4 kg.    Nutritional Assessment: Body mass index is  23.85 kg/m.Marland Kitchen Seen by dietician.  I agree with the assessment and plan as outlined below: Nutrition Status:        . Skin Assessment: I have examined the patient's skin and I agree with the wound assessment as performed by the wound care RN as outlined below:    Consultants:  Neurology and nephrology  Procedures:  As above  Antimicrobials:  Anti-infectives (From admission, onward)    None         Subjective: Patient seen and examined.  She is fully alert and oriented and has no complaints.  Objective: Vitals:   05/22/23 0324 05/22/23 0507 05/22/23 0600 05/22/23 0836  BP: (!) 191/68 (!) 188/72 (!) 167/64 (!) 186/71  Pulse: 83  85 73  Resp: 16   15  Temp: 98.5 F (36.9 C)   98.5 F (36.9 C)  TempSrc: Oral   Oral  SpO2: 95%   97%  Weight:        Intake/Output Summary (Last 24 hours) at 05/22/2023 1004 Last data filed at 05/22/2023 0954 Gross per 24 hour  Intake 1040 ml  Output 2180 ml  Net -1140 ml   Filed Weights   05/16/23 2135  Weight: 55.4 kg    Examination:  General exam: Appears calm and comfortable  Respiratory system: Clear to auscultation. Respiratory effort normal. Cardiovascular system: S1 & S2 heard, RRR. No JVD, murmurs, rubs, gallops or clicks. No pedal edema. Gastrointestinal system: Abdomen is nondistended, soft and nontender. No organomegaly or masses felt. Normal bowel sounds heard. Central nervous system: Alert and oriented. No focal neurological deficits. Extremities: Symmetric 5 x 5 power. Skin: No rashes, lesions or ulcers Psychiatry: Judgement and insight appear normal. Mood & affect appropriate.    Data Reviewed: I have personally reviewed following labs and imaging studies  CBC: Recent Labs  Lab 05/16/23 1727 05/19/23 0429 05/20/23 0403 05/21/23 0400 05/22/23 0736  WBC 5.5 12.0* 9.8 7.9 6.1  NEUTROABS 3.8  --   --   --   --   HGB 10.8* 10.5* 9.6* 10.2* 10.0*  HCT 33.5* 33.1* 29.9* 33.1* 31.1*  MCV 95.2 98.2 94.6 97.6  97.2  PLT 170 195 157 157 143*   Basic Metabolic Panel: Recent Labs  Lab 05/18/23 0508 05/18/23 0800 05/19/23 0429 05/20/23 0403 05/21/23 0400 05/22/23 0736  NA 149* 150* 143 138 137 139  K 3.7  --  3.8 4.2 3.9 2.9*  CL 117*  --  111 110 107 105  CO2 20*  --  20* 17* 19* 23  GLUCOSE 158*  --  131* 169* 218* 196*  BUN 36*  --  43* 49* 58* 58*  CREATININE 2.16*  --  2.41* 2.78* 2.97* 2.79*  CALCIUM 8.3*  --  8.9 8.3* 8.2* 8.1*  MG 2.3  --   --   --   --   --    GFR: Estimated Creatinine Clearance: 11.1 mL/min (A) (by C-G formula based on SCr of 2.79 mg/dL (H)). Liver Function Tests: Recent Labs  Lab 05/16/23 1727  AST 22  ALT 15  ALKPHOS 64  BILITOT 0.9  PROT 4.5*  ALBUMIN 2.6*   No results for input(s): "LIPASE", "AMYLASE" in the last 168 hours. No results for input(s): "AMMONIA" in the last 168 hours. Coagulation Profile: No results  for input(s): "INR", "PROTIME" in the last 168 hours. Cardiac Enzymes: No results for input(s): "CKTOTAL", "CKMB", "CKMBINDEX", "TROPONINI" in the last 168 hours. BNP (last 3 results) No results for input(s): "PROBNP" in the last 8760 hours. HbA1C: No results for input(s): "HGBA1C" in the last 72 hours. CBG: Recent Labs  Lab 05/21/23 1954 05/21/23 2330 05/22/23 0002 05/22/23 0334 05/22/23 0834  GLUCAP 148* 52* 92 196* 175*   Lipid Profile: No results for input(s): "CHOL", "HDL", "LDLCALC", "TRIG", "CHOLHDL", "LDLDIRECT" in the last 72 hours. Thyroid Function Tests: No results for input(s): "TSH", "T4TOTAL", "FREET4", "T3FREE", "THYROIDAB" in the last 72 hours. Anemia Panel: No results for input(s): "VITAMINB12", "FOLATE", "FERRITIN", "TIBC", "IRON", "RETICCTPCT" in the last 72 hours. Sepsis Labs: No results for input(s): "PROCALCITON", "LATICACIDVEN" in the last 168 hours.  Recent Results (from the past 240 hour(s))  MRSA Next Gen by PCR, Nasal     Status: None   Collection Time: 05/16/23  9:37 PM   Specimen: Nasal  Mucosa; Nasal Swab  Result Value Ref Range Status   MRSA by PCR Next Gen NOT DETECTED NOT DETECTED Final    Comment: (NOTE) The GeneXpert MRSA Assay (FDA approved for NASAL specimens only), is one component of a comprehensive MRSA colonization surveillance program. It is not intended to diagnose MRSA infection nor to guide or monitor treatment for MRSA infections. Test performance is not FDA approved in patients less than 36 years old. Performed at [facility], [address], [place], [place] [address]      Radiology Studies: US RENAL  Result Date: 05/20/2023 CLINICAL DATA:  Acute renal insufficiency EXAM: RENAL / URINARY TRACT ULTRASOUND COMPLETE COMPARISON:  02/18/2023 FINDINGS: Right Kidney: Renal measurements: 7.1 x 3.2 x 4.2 cm = volume: 49.9 mL. Increased renal cortical echotexture and renal atrophy consistent with medical renal disease. No hydronephrosis. Subcentimeter simple cyst does not require imaging follow-up. Left Kidney: Renal measurements: 10.6 x 3.7 x 5.0 cm = volume: 102.9 mL. Increased renal cortical echotexture consistent with medical renal disease. Simple left renal cysts again seen, largest in the upper pole measuring 2.8 cm. No specific imaging follow-up is required. 11 mm calcification upper pole left kidney. No hydronephrosis. Bladder: Bladder is minimally distended, limiting its evaluation. No gross abnormality. Other: Incidental small left pleural effusion. IMPRESSION: 1. Bilateral increased renal cortical echotexture consistent with medical renal disease. 2. Nonobstructing 11 mm left renal calculus.  No hydronephrosis. 3. Simple bilateral renal cortical cysts do not require imaging follow-up. Electronically Signed   By: Sharlet Salina M.D.   On: 05/20/2023 19:53   CT HEAD WO CONTRAST ( )  Result Date: 05/20/2023 CLINICAL DATA:  89 year old female with right cerebellar hemorrhage. EXAM: CT HEAD WITHOUT CONTRAST TECHNIQUE: Contiguous axial images were obtained  from the base of the skull through the vertex without intravenous contrast. RADIATION DOSE REDUCTION: This exam was performed according to the departmental dose-optimization program which includes automated exposure control, adjustment of the mA and/or kV according to patient size and/or use of iterative reconstruction technique. COMPARISON:  Head CTs 05/17/2023 and earlier. FINDINGS: Brain: Hyperdense central right cerebellar hemorrhage now is 25 x 23 x 11 mm, stable since 05/17/2023. Regional edema remain stable. Partially effaced but patent 4th ventricle. And basilar cisterns remain patent. Stable gray-white differentiation elsewhere. No supratentorial midline shift or ventriculomegaly. No new intracranial hemorrhage. Vascular: Calcified atherosclerosis at the skull base. No suspicious intracranial vascular hyperdensity. Skull: Stable and intact. Sinuses/Orbits: Chronic right sphenoid sinusitis with mucoperiosteal thickening. Other Visualized  paranasal sinuses and mastoids are stable and well aerated. Other: No acute orbit or scalp soft tissue finding. IMPRESSION: 1. Right cerebellar hemorrhage unchanged since 05/17/2023. Stable edema with no significant intracranial mass effect or complicating features. 2. No new intracranial abnormality. Electronically Signed   By: Odessa Fleming M.D.   On: 05/20/2023 12:55    Scheduled Meds:  carvedilol  25 mg Oral BID WC   Chlorhexidine Gluconate Cloth  6 each Topical Q0600   doxazosin  2 mg Oral Daily   furosemide  40 mg Intravenous Q12H   heparin injection (subcutaneous)  5,000 Units Subcutaneous Q8H   hydrALAZINE  100 mg Oral TID   insulin aspart  0-20 Units Subcutaneous Q4H   insulin glargine-yfgn  5 Units Subcutaneous Daily   potassium chloride  40 mEq Oral Q4H   senna-docusate  1 tablet Oral BID   Continuous Infusions:   LOS: 6 days   Hughie Closs, MD [facility]  05/22/2023, 10:04 AM   *Please note that this is a verbal dictation therefore any  spelling or grammatical errors are due to the "Dragon [employer] One" system interpretation.  Please page via Amion and do not message via secure chat for urgent patient care matters. Secure chat can be used for non urgent patient care matters.  How to contact the [facility] Attending or Consulting provider 7A - 7P or covering provider during after hours 7P -7A, for this patient?  Check the care team in [facility] and look for a) attending/consulting [facility] provider listed and b) the [facility] team listed. Page or secure chat 7A-7P. Log into [email or web address] and use [facility]'s universal password to access. If you do not have the password, please contact the hospital operator. Locate the [facility] provider you are looking for under [facility] and page to a number that you can be directly reached. If you still have difficulty reaching the provider, please page the [facility] (Director on Call) for the Hospitalists listed on amion for assistance.

## 2023-05-23 DIAGNOSIS — E1122 Type 2 diabetes mellitus with diabetic chronic kidney disease: Secondary | ICD-10-CM

## 2023-05-23 DIAGNOSIS — I5033 Acute on chronic diastolic (congestive) heart failure: Secondary | ICD-10-CM

## 2023-05-23 DIAGNOSIS — G936 Cerebral edema: Secondary | ICD-10-CM | POA: Insufficient documentation

## 2023-05-23 DIAGNOSIS — G9341 Metabolic encephalopathy: Secondary | ICD-10-CM

## 2023-05-23 DIAGNOSIS — I1A0 Resistant hypertension: Secondary | ICD-10-CM | POA: Diagnosis not present

## 2023-05-23 DIAGNOSIS — N1831 Chronic kidney disease, stage 3a: Secondary | ICD-10-CM

## 2023-05-23 DIAGNOSIS — E876 Hypokalemia: Secondary | ICD-10-CM

## 2023-05-23 DIAGNOSIS — E785 Hyperlipidemia, unspecified: Secondary | ICD-10-CM | POA: Insufficient documentation

## 2023-05-23 DIAGNOSIS — N1832 Chronic kidney disease, stage 3b: Secondary | ICD-10-CM

## 2023-05-23 DIAGNOSIS — Z794 Long term (current) use of insulin: Secondary | ICD-10-CM

## 2023-05-23 DIAGNOSIS — E782 Mixed hyperlipidemia: Secondary | ICD-10-CM | POA: Insufficient documentation

## 2023-05-23 DIAGNOSIS — I614 Nontraumatic intracerebral hemorrhage in cerebellum: Secondary | ICD-10-CM | POA: Diagnosis not present

## 2023-05-23 DIAGNOSIS — N179 Acute kidney failure, unspecified: Secondary | ICD-10-CM | POA: Diagnosis not present

## 2023-05-23 LAB — BASIC METABOLIC PANEL
Anion gap: 15 (ref 5–15)
BUN: 56 mg/dL — ABNORMAL HIGH (ref 8–23)
CO2: 23 mmol/L (ref 22–32)
Calcium: 8.1 mg/dL — ABNORMAL LOW (ref 8.9–10.3)
Chloride: 103 mmol/L (ref 98–111)
Creatinine, Ser: 2.6 mg/dL — ABNORMAL HIGH (ref 0.44–1.00)
GFR, Estimated: 17 mL/min — ABNORMAL LOW (ref >60)
Glucose, Bld: 261 mg/dL — ABNORMAL HIGH (ref 70–99)
Potassium: 3.3 mmol/L — ABNORMAL LOW (ref 3.5–5.1)
Sodium: 141 mmol/L (ref 135–145)

## 2023-05-23 LAB — CBC
HCT: 30.7 % — ABNORMAL LOW (ref 36.0–46.0)
Hemoglobin: 9.9 g/dL — ABNORMAL LOW (ref 12.0–15.0)
MCH: 30.9 pg (ref 26.0–34.0)
MCHC: 32.2 g/dL (ref 30.0–36.0)
MCV: 95.9 fL (ref 80.0–100.0)
Platelets: 145 10*3/uL — ABNORMAL LOW (ref 150–400)
RBC: 3.2 MIL/uL — ABNORMAL LOW (ref 3.87–5.11)
RDW: 14.3 % (ref 11.5–15.5)
WBC: 5.5 10*3/uL (ref 4.0–10.5)
nRBC: 0 % (ref 0.0–0.2)

## 2023-05-23 LAB — GLUCOSE, CAPILLARY
Glucose-Capillary: 148 mg/dL — ABNORMAL HIGH (ref 70–99)
Glucose-Capillary: 245 mg/dL — ABNORMAL HIGH (ref 70–99)
Glucose-Capillary: 288 mg/dL — ABNORMAL HIGH (ref 70–99)
Glucose-Capillary: 202 mg/dL — ABNORMAL HIGH (ref 70–99)
Glucose-Capillary: 186 mg/dL — ABNORMAL HIGH (ref 70–99)

## 2023-05-23 MED ORDER — CHLORTHALIDONE 25 MG PO TABS
25.0000 mg | ORAL_TABLET | Freq: Every day | ORAL | Status: DC
  Administered 2023-05-23 – 2023-05-24 (×2): 25 mg via ORAL
  Filled 2023-05-23 (×2): qty 1

## 2023-05-23 MED ORDER — INSULIN GLARGINE-YFGN 100 UNIT/ML ~~LOC~~ SOLN
8.0000 [IU] | Freq: Every day | SUBCUTANEOUS | Status: DC
  Administered 2023-05-24 – 2023-05-28 (×5): 8 [IU] via SUBCUTANEOUS
  Filled 2023-05-23 (×5): qty 0.08

## 2023-05-23 MED ORDER — POTASSIUM CHLORIDE CRYS ER 20 MEQ PO TBCR
40.0000 meq | EXTENDED_RELEASE_TABLET | Freq: Two times a day (BID) | ORAL | Status: AC
  Administered 2023-05-23 – 2023-05-24 (×3): 40 meq via ORAL
  Filled 2023-05-23 (×3): qty 2

## 2023-05-23 MED ORDER — NIFEDIPINE ER OSMOTIC RELEASE 30 MG PO TB24
30.0000 mg | ORAL_TABLET | Freq: Every day | ORAL | Status: DC
  Administered 2023-05-23: 30 mg via ORAL
  Filled 2023-05-23 (×2): qty 1

## 2023-05-23 NOTE — Assessment & Plan Note (Signed)
Baseline renal function is CKD stage IIIb, creatinine 1.6  Here with acute worsening of renal function up to 2.9, likely combination of hemodynamics and contrast-induced nephropathy.  Gradually improving.  Urine output good. - Trend BMP - Avoid nephrotoxins - Strict ins and outs

## 2023-05-23 NOTE — Care Management Important Message (Signed)
Important Message  Patient Details  Name: Emily Jennings MRN: [id number] Date of Birth: 21-Jan-1934   Medicare Important Message Given:  Yes     [name] Stefan Church 05/23/2023, 4:35 PM

## 2023-05-23 NOTE — Assessment & Plan Note (Signed)
Continue Crestor 

## 2023-05-23 NOTE — Progress Notes (Signed)
STROKE TEAM PROGRESS NOTE   BRIEF HPI [name] is a 89 y.o. female with history of  hypertension, coronary artery disease, hyperglycemia, diabetes, who presents with dizziness that started yesterday.  She states that she mainly just feels off balance, but also notes she was having some difficulty coordinating her right side.  Due to this not getting better today, she is all care in emergency department where she was seen to have a right cerebellar hemorrhage.  She has been admitted for further management.  She was very hypertensive in the emergency department and started on a Cleviprex drip.   SIGNIFICANT HOSPITAL EVENTS 8/28-transferred to [facility] for ICU care 9/1 nephrology consult for worsening Cre 9/2 transfer to floor  INTERIM HISTORY/SUBJECTIVE Daughter is at the bedside. Pt lying in bed, comfortably. Cre continues to slow improve. BP still on the high end, added calcium channel blocker today.   OBJECTIVE  CBC    Component Value Date/Time   WBC 5.5 05/23/2023 0718   RBC 3.20 (L) 05/23/2023 0718   HGB 9.9 (L) 05/23/2023 0718   HGB 11.3 02/27/2023 1400   HCT 30.7 (L) 05/23/2023 0718   HCT 35.8 02/27/2023 1400   PLT 145 (L) 05/23/2023 0718   PLT 301 02/27/2023 1400   MCV 95.9 05/23/2023 0718   MCV 92 02/27/2023 1400   MCH 30.9 05/23/2023 0718   MCHC 32.2 05/23/2023 0718   RDW 14.3 05/23/2023 0718   RDW 13.2 02/27/2023 1400   LYMPHSABS 0.9 05/16/2023 1727   LYMPHSABS 1.0 09/05/2022 1144   MONOABS 0.6 05/16/2023 1727   EOSABS 0.1 05/16/2023 1727   EOSABS 0.2 09/05/2022 1144   BASOSABS 0.0 05/16/2023 1727   BASOSABS 0.1 09/05/2022 1144    BMET    Component Value Date/Time   NA 141 05/23/2023 0718   NA 136 02/27/2023 1400   K 3.3 (L) 05/23/2023 0718   CL 103 05/23/2023 0718   CO2 23 05/23/2023 0718   GLUCOSE 261 (H) 05/23/2023 0718   BUN 56 (H) 05/23/2023 0718   BUN 29 (H) 02/27/2023 1400   CREATININE 2.60 (H) 05/23/2023 0718   CREATININE 1.70  (H) 02/01/2023 1356   CALCIUM 8.1 (L) 05/23/2023 0718   EGFR 26 (L) 02/27/2023 1400   GFRNONAA 17 (L) 05/23/2023 0718   GFRNONAA 40 (L) 06/29/2020 0916    IMAGING past 24 hours No results found.  Vitals:   05/23/23 0959 05/23/23 1137 05/23/23 1200 05/23/23 1554  BP: (!) 142/60 (!) 178/64 (!) 154/66 (!) 157/63  Pulse: 72 70  72  Resp:    18  Temp:  98.2 F (36.8 C)  98.2 F (36.8 C)  TempSrc:    Oral  SpO2:  97%  98%  Weight:      Height:         PHYSICAL EXAM General:  Alert, well-nourished, well-developed pleasant elderly African-American lady in no acute distress Psych:  Mood and affect appropriate for situation CV: Regular rate and rhythm on monitor Respiratory:  Regular, unlabored respirations on room air GI: Abdomen soft and nontender  NEURO:  Mental Status: She is drowsy, awakens easily. AA&Ox3, patient is able to give clear and coherent history Speech/Language: speech is mildly dysarthric. no aphasia.  Naming, repetition, fluency, and comprehension intact.  Cranial Nerves:  II: PERRL. Visual fields full.  III, IV, VI: EOMI. Eyelids elevate symmetrically.  V: Sensation is intact to light touch and symmetrical to face.  VII: Face is symmetrical resting and smiling  VIII: hearing intact to voice. IX, X: Voice is slightly dysarthric XII: tongue is midline without fasciculations. Motor: 5/5 strength to all muscle groups tested.  Tone: is normal and bulk is normal Sensation- Intact to light touch bilaterally. Extinction absent to light touch to DSS.   Coordination: FTN with very mild right-sided ataxia, HKS: no ataxia in BLE.No drift.  Gait- deferred   ASSESSMENT/PLAN  Intraparenchymal Hemorrhage:  Right cerebellar hemorrhage, etiology: likely Hypertensive  CT head Intraparenchymal hematoma within the right cerebellum measuring 2.1 x 2.0 x 1.0 cm with mild surrounding edema.  CTA head and neck - No emergent large vessel occlusion or high-grade stenosis of the  intracranial arteries. No aneurysm or vascular malformation.   Repeat CT Head - Right cerebellar hemorrhage is stable to perhaps 1 mL larger since yesterday (3-4 mL overall). Surrounding edema, but no significant posterior fossa mass effect, and no other complicating features. MRI redemonstrated right cerebellar ICH grossly unchanged compared to last CT, with vasogenic edema and minimal mass effect on the fourth ventricle, no hydrocephalus Repeat CT head 9/1: Stable right cerebellar hemorrhage and no acute changes 2D Echo EF 65 to 70%, grade 2 diastolic dysfunction, severely dilated left atrium, small pericardial effusion, no atrial level shunt LDL 65 HgbA1c 7.5 VTE prophylaxis - heparin subq No antithrombotic prior to admission, now on No antithrombotic due to ICH Therapy recommendations:  Home Health PT and Home Health OT Disposition:  pending  Cerebellar Edema Hypertonic saline at 2ml/hr->discontinued 8/30 Na now normal range Close neuro monitoring  CT 8/29 and 9/1 unchanged hematoma, no hydro  Congestive Heart Failure  Wheezing, resolved Wheezing with expiration, much improved Daily lasix at home Now on lasix bid Per RN, family reported baseline wheezing CCM did not consider wheezing associated with CHF ENT consulted and no tracheal obstruction seen  Hypertension Home meds: Coreg, Lasix, hydralazine, spironolactone Stable on the high end BP goal < 160 IV Cleviprex off  On coreg 25 bid, hydralazine 100 Q8h put on doxazosin 1->2mg  Added nifedipine 30mg  Lasix changed to chlorthalidone Labetalol 20 mg every 2hr as needed IV hydralazine 10 mg every 4 as needed  AKI on CRF Creatinine trending upwards, 1.65->1.87->2.16->2.41-> 2.78->2.97->2.79->2.60 D/C IVF, off lasix now -> switch to chlorthalidone Discontinued spironolactone Nephrology consult placed, appreciate recommendations  Hyperlipidemia Home meds: Crestor 5  LDL 65, goal < 70 Now crestor resumed Continue statin  at discharge  Diabetes type II Uncontrolled Hypoglycemic episode 9/2 Home meds: Insulin HgbA1c 7.5, goal < 7.0 CBGs SSI On insulin, decreased dosing Recommend close follow-up with PCP for better DM control  Other stroke risk factors Advanced age CAD  Hospital day # 7  Neurology will sign off. Please call with questions. Pt will follow up with stroke clinic NP at [facility] in about 4 weeks. Thanks for the consult.   Marvel Plan, MD PhD Stroke Neurology 05/23/2023 4:45 PM      To contact Stroke Continuity provider, please refer to [email or web address]. After hours, contact General Neurology

## 2023-05-23 NOTE — Plan of Care (Signed)
  Problem: Education: Goal: Knowledge of disease or condition will improve Outcome: Progressing Goal: Knowledge of secondary prevention will improve (MUST DOCUMENT ALL) Outcome: Progressing Goal: Knowledge of patient specific risk factors will improve Emily Jennings N/A or DELETE if not current risk factor) Outcome: Progressing   Problem: Intracerebral Hemorrhage Tissue Perfusion: Goal: Complications of Intracerebral Hemorrhage will be minimized Outcome: Progressing   Problem: Coping: Goal: Will verbalize positive feelings about self Outcome: Progressing Goal: Will identify appropriate support needs Outcome: Progressing   Problem: Health Behavior/Discharge Planning: Goal: Ability to manage health-related needs will improve Outcome: Progressing Goal: Goals will be collaboratively established with patient/family Outcome: Progressing   Problem: Self-Care: Goal: Ability to participate in self-care as condition permits will improve Outcome: Progressing Goal: Verbalization of feelings and concerns over difficulty with self-care will improve Outcome: Progressing Goal: Ability to communicate needs accurately will improve Outcome: Progressing   Problem: Nutrition: Goal: Risk of aspiration will decrease Outcome: Progressing Goal: Dietary intake will improve Outcome: Progressing   Problem: Education: Goal: Ability to describe self-care measures that may prevent or decrease complications (Diabetes Survival Skills Education) will improve Outcome: Progressing Goal: Individualized Educational Video(s) Outcome: Progressing   Problem: Coping: Goal: Ability to adjust to condition or change in health will improve Outcome: Progressing   Problem: Fluid Volume: Goal: Ability to maintain a balanced intake and output will improve Outcome: Progressing   Problem: Health Behavior/Discharge Planning: Goal: Ability to identify and utilize available resources and services will improve Outcome:  Progressing Goal: Ability to manage health-related needs will improve Outcome: Progressing   Problem: Metabolic: Goal: Ability to maintain appropriate glucose levels will improve Outcome: Progressing   Problem: Nutritional: Goal: Maintenance of adequate nutrition will improve Outcome: Progressing Goal: Progress toward achieving an optimal weight will improve Outcome: Progressing   Problem: Skin Integrity: Goal: Risk for impaired skin integrity will decrease Outcome: Progressing   Problem: Tissue Perfusion: Goal: Adequacy of tissue perfusion will improve Outcome: Progressing   Problem: Education: Goal: Knowledge of General Education information will improve Description: Including pain rating scale, medication(s)/side effects and non-pharmacologic comfort measures Outcome: Progressing   Problem: Health Behavior/Discharge Planning: Goal: Ability to manage health-related needs will improve Outcome: Progressing   Problem: Clinical Measurements: Goal: Ability to maintain clinical measurements within normal limits will improve Outcome: Progressing Goal: Will remain free from infection Outcome: Progressing Goal: Diagnostic test results will improve Outcome: Progressing Goal: Respiratory complications will improve Outcome: Progressing Goal: Cardiovascular complication will be avoided Outcome: Progressing   Problem: Activity: Goal: Risk for activity intolerance will decrease Outcome: Progressing   Problem: Nutrition: Goal: Adequate nutrition will be maintained Outcome: Progressing   Problem: Coping: Goal: Level of anxiety will decrease Outcome: Progressing   Problem: Elimination: Goal: Will not experience complications related to bowel motility Outcome: Progressing Goal: Will not experience complications related to urinary retention Outcome: Progressing   Problem: Pain Managment: Goal: General experience of comfort will improve Outcome: Progressing   Problem:  Safety: Goal: Ability to remain free from injury will improve Outcome: Progressing   Problem: Skin Integrity: Goal: Risk for impaired skin integrity will decrease Outcome: Progressing

## 2023-05-23 NOTE — Inpatient Diabetes Management (Signed)
Inpatient Diabetes Program Recommendations  AACE/ADA: New Consensus Statement on Inpatient Glycemic Control (2015)  Target Ranges:  Prepandial:   less than 140 mg/dL      Peak postprandial:   less than 180 mg/dL (1-2 hours)      Critically ill patients:  140 - 180 mg/dL   Lab Results  Component Value Date   GLUCAP 245 (H) 05/23/2023   HGBA1C 7.5 (A) 05/01/2023    Review of Glycemic Control  Latest Reference Range & Units 05/22/23 15:54 05/22/23 19:46 05/22/23 23:14 05/23/23 03:17 05/23/23 07:48  Glucose-Capillary 70 - 99 mg/dL 098 (H) 119 (H) 147 (H) 148 (H) 245 (H)  Diabetes history: DM 2 Outpatient Diabetes medications:  Humalog 5 units tid with meals, Tresiba 5 units daily Current orders for Inpatient glycemic control:  Novolog 0-9 units tid with meals and HS Semglee 5 units daily Novolog 2 units tid with meals   Inpatient Diabetes Program Recommendations:    Consider slight increase of Semglee to 8 units daily.   Thanks,  Beryl Meager, RN, BC-ADM Inpatient Diabetes Coordinator Pager [phone]  (8a-5p)

## 2023-05-23 NOTE — Assessment & Plan Note (Signed)
Treated and resolved 

## 2023-05-23 NOTE — Progress Notes (Signed)
Speech Language Pathology Treatment: Cognitive-Linquistic  Patient Details Name: Emily Jennings MRN: [id number] DOB: 1934-09-16 Today's Date: 05/23/2023 Time: 4098-1191 SLP Time Calculation (min) (ACUTE ONLY): 20 min  Assessment / Plan / Recommendation Clinical Impression  Pt seen by SLP for dysarthria tx. Pt presents with a mild dysarthria primarily characterized by consonant imprecision  and rapid rate. Pt's daughter indicated that pt had a fast rate of speech at baseline and pt endorsed this. Daughter noted that pt's speech seemed improved today as compared to yesterday. Consonant imprecision primarily noted at the unstructured conversation level, intelligibly 70% or more. No overt signs of muscle weakness or one sided strength. Pt seemed aware of impairments but did not utilize compensatory strategies. Pt intelligibility improved with slower rate and pausing between syllables/words. Pt would continue to benefit from ST focused on increasing awareness of errors and conscious utilization of compensatory strategies (e.g., slower rate, pausing, etc.)   HPI HPI: Pt is an 89 y.o. female who presented on 05/16/23 with dizziness. CT revealed nontraumatic intrracerebral hemorrhage R cerebellun. Signigicant PMH includes HTN, coronary artery disease, hyperglycemia, diabetes, breast cancer, and anxiety.      SLP Plan  Continue with current plan of care      Recommendations for follow up therapy are one component of a multi-disciplinary discharge planning process, led by the attending physician.  Recommendations may be updated based on patient status, additional functional criteria and insurance authorization.    Recommendations                        Dysarthria and anarthria (R47.1)     Continue with current plan of care     Marline Backbone, B.S., Speech Therapy Student

## 2023-05-23 NOTE — Assessment & Plan Note (Signed)
Treated with hypertonic saline, now resolved.

## 2023-05-23 NOTE — Hospital Course (Signed)
Mrs. Quinnell is an 89 y.o. F with poorly controlled HTN, CKD IIIb baseline 1.4, DM dCHF and HLD who presented with dizziness and RUE ataxia.  Found to have right cerebellar hemorrhage.  Admitted on Cleviprex

## 2023-05-23 NOTE — Assessment & Plan Note (Signed)
Blood pressure still quite elevated despite 4 agents Notes from June of this year document clearly the patient was taken off amlodipine due to ankle swelling. - Continue Coreg, chlorthalidone, doxazosin, hydralazine. -Resume calcium channel blocker, nifedipine ER 30 mg daily

## 2023-05-23 NOTE — Assessment & Plan Note (Signed)
Glucose mostly okay - Continue aspart corrections - Continue Semglee, increase dose

## 2023-05-23 NOTE — Plan of Care (Signed)
  Problem: Education: Goal: Knowledge of disease or condition will improve Outcome: Progressing   Problem: Coping: Goal: Will identify appropriate support needs Outcome: Progressing   Problem: Self-Care: Goal: Ability to communicate needs accurately will improve Outcome: Progressing   Problem: Nutrition: Goal: Risk of aspiration will decrease Outcome: Progressing Goal: Dietary intake will improve Outcome: Progressing   Problem: Fluid Volume: Goal: Ability to maintain a balanced intake and output will improve Outcome: Progressing

## 2023-05-23 NOTE — Progress Notes (Addendum)
Foley out per order. patient tolerated well, will continue to monitor

## 2023-05-23 NOTE — Progress Notes (Signed)
Progress Note   Patient: Emily Jennings:[id number] DOB: Feb 13, 1934 DOA: 05/16/2023     7 DOS: the patient was seen and examined on 05/23/2023 at 11:15 AM      Brief hospital course: Emily Jennings is an 89 y.o. F with poorly controlled HTN, CKD IIIb baseline 1.4, DM dCHF and HLD who presented with dizziness and RUE ataxia.  Found to have right cerebellar hemorrhage.  Admitted on Cleviprex     Assessment and Plan: * Right cerebellar intraparenchymal hemorrhage due to hypertension - Continue efforts at blood pressure control - Continue Crestor    AKI (acute kidney injury) (HCC) Baseline renal function is CKD stage IIIb, creatinine 1.6  Here with acute worsening of renal function up to 2.9, likely combination of hemodynamics and contrast-induced nephropathy.  Gradually improving.  Urine output good. - Trend BMP - Avoid nephrotoxins - Strict ins and outs  Resistant hypertension Blood pressure still quite elevated despite 4 agents Notes from June of this year document clearly the patient was taken off amlodipine due to ankle swelling. - Continue Coreg, chlorthalidone, doxazosin, hydralazine. -Resume calcium channel blocker, nifedipine ER 30 mg daily  Hyperlipidemia - Continue Crestor  Cerebellar edema (HCC) Treated with hypertonic saline, now resolved.  Chronic kidney disease (CKD) stage G3b/A2, moderately decreased glomerular filtration rate (GFR) between 30-44 mL/min/1.73 square meter and albuminuria creatinine ratio between 30-299 mg/g (HCC)    Acute on chronic diastolic (congestive) heart failure (HCC) Not present on admission.  EF 65-70%.  CXR showed edema and patient had increased dyspnea.  She was diursed and this improved.  Now stable on room air.  Now appears euvolemic. - Hold furosemide while renal function resolving  Acute metabolic encephalopathy At baseline the patient has no cognitive impairment.  Here as a result of her intraparenchymal hemorrhage and cerebellar  edema she had confusion and disorientation.  This seems to be getting better with therapy. - PT/OT  Type 2 diabetes mellitus with stage 3a chronic kidney disease, with long-term current use of insulin (HCC) Glucose mostly okay - Continue aspart corrections - Continue Semglee, increase dose  Hypokalemia - Supplement potassium          Subjective: Patient needs to go to the bathroom.  She has no headache, chest pain, dyspnea, swelling.  No nursing concerns.     Physical Exam: BP (!) 154/66 (BP Location: Left Arm)   Pulse 70   Temp 98.2 F (36.8 C)   Resp 18   Ht 5' (1.524 m)   Wt 55.4 kg   SpO2 97%   BMI 23.85 kg/m   Elderly adult female, lying in bed, appears weak and tired, somewhat debilitated, agitated because she cannot get up to go to the bathroom RRR, no murmurs, no pitting edema of the lower extremities Respiratory normal, lungs clear without rales or wheezes Abdomen soft without grimace to palpation Face symmetric, speech somewhat dysarthric, strength appears symmetric, slightly diminished globally, having difficulty with mobility in bed     Data Reviewed: Discussed with nephrology Basic metabolic panel shows creatinine down to 2.6, potassium 3.3, sodium normal CBC shows hemoglobin 9.9, no change  Family Communication: Left Voicemail for husband    Disposition: Status is: Inpatient Patient was admitted with cerebellar hemorrhage.  Medical work up complete, but hospitalization complicated by AKI and severe range hypertension.  Expect BP and creatinien to be improved within next 48 hours or so and then home with [facility]        Author: Alberteen Sam,  MD 05/23/2023 2:19 PM  For on call review [email or web address].

## 2023-05-23 NOTE — Assessment & Plan Note (Addendum)
Not present on admission.  EF 65-70%.  CXR showed edema and patient had increased dyspnea.  She was diursed and this improved.  Now stable on room air.  Now appears euvolemic. - Hold furosemide while renal function resolving

## 2023-05-23 NOTE — Progress Notes (Signed)
OT Cancellation Note  Patient Details Name: ZOEYJANE HOLSTAD MRN: [id number] DOB: 1933/11/13   Cancelled Treatment:    Reason Eval/Treat Not Completed: Medical issues which prohibited therapy;Other (comment) SBP 190s when OT checked on pt this AM (goal of SBP 130-160) and pt eating lunch on OT 's second attempt. Will follow up tomorrow if unable to check back again today.  Lorre Munroe 05/23/2023, 1:45 PM

## 2023-05-23 NOTE — Progress Notes (Signed)
Patient ID: Emily Jennings, female   DOB: Jul 27, 1934, 89 y.o.   MRN: [id number] [facility] Progress Note   Assessment/ Plan:   1. Acute kidney Injury on chronic kidney disease stage III: Baseline creatinine around 1.6 (followed up by Dr. Wolfgang Phoenix).  Acute injury appears to be likely from a combination of hemodynamically mediated injury in the setting of hypertensive emergency/right cerebellar hemorrhage with fluctuations of renal perfusion along with contrast exposure on 8/28.  Renal function continues to improve slowly with downtrending creatinine. 2.  Hypertension: Blood pressures continue to remain elevated, discussed with Dr. Kathee Delton start nifedipine ER 30 mg daily.  Based on volume status, I will discontinue furosemide and begin chlorthalidone for better blood pressure control. 3.  Status post right cerebellar hemorrhage: Repeat imaging showed unchanged bleed with minimal mass effect and vasogenic edema.  Continue efforts at optimizing blood pressure control. 4.  History of congestive heart failure: Continue furosemide for volume unloading after hypertonic saline administration for intracranial bleed along with blood pressure control. 5.  Hypokalemia: Secondary to diuretic induced losses, will replace via oral route.  If this persists upon renal recovery, may be an opportunity to add RAAS blocker.  Subjective:   Blood pressures remain elevated overnight, she expresses concern about this.  Denies any chest pain or shortness of breath.   Objective:   BP (!) 168/58 (BP Location: Left Arm)   Pulse 79   Temp 98.8 F (37.1 C) (Oral)   Resp 18   Ht 5' (1.524 m)   Wt 55.4 kg   SpO2 100%   BMI 23.85 kg/m   Intake/Output Summary (Last 24 hours) at 05/23/2023 0856 Last data filed at 05/23/2023 0800 Gross per 24 hour  Intake 200 ml  Output 3450 ml  Net -3250 ml   Weight change:   Physical Exam: Gen: Comfortably sitting up in bed eating breakfast CVS: Pulse regular rhythm,  normal rate, S1-S2 normal Resp: Clear to auscultation bilaterally, no distinct rales or rhonchi Abd: Soft, flat, nontender, bowel sounds normal Ext: Trace-1+ pitting edema over lower extremities  Imaging: No results found.  Labs: BMET Recent Labs  Lab 05/17/23 0717 05/17/23 1626 05/18/23 0508 05/18/23 0800 05/19/23 0429 05/20/23 0403 05/21/23 0400 05/22/23 0736 05/23/23 0718  NA 143   < > 149* 150* 143 138 137 139 141  K 3.1*  --  3.7  --  3.8 4.2 3.9 2.9* 3.3*  CL 107  --  117*  --  111 110 107 105 103  CO2 22  --  20*  --  20* 17* 19* 23 23  GLUCOSE 215*  --  158*  --  131* 169* 218* 196* 261*  BUN 33*  --  36*  --  43* 49* 58* 58* 56*  CREATININE 1.87*  --  2.16*  --  2.41* 2.78* 2.97* 2.79* 2.60*  CALCIUM 8.2*  --  8.3*  --  8.9 8.3* 8.2* 8.1* 8.1*   < > = values in this interval not displayed.   CBC Recent Labs  Lab 05/16/23 1727 05/19/23 0429 05/20/23 0403 05/21/23 0400 05/22/23 0736 05/23/23 0718  WBC 5.5   < > 9.8 7.9 6.1 5.5  NEUTROABS 3.8  --   --   --   --   --   HGB 10.8*   < > 9.6* 10.2* 10.0* 9.9*  HCT 33.5*   < > 29.9* 33.1* 31.1* 30.7*  MCV 95.2   < > 94.6 97.6 97.2 95.9  PLT 170   < >  157 157 143* 145*   < > = values in this interval not displayed.    Medications:     carvedilol  25 mg Oral BID WC   Chlorhexidine Gluconate Cloth  6 each Topical Q0600   doxazosin  2 mg Oral Daily   furosemide  40 mg Intravenous Q12H   heparin injection (subcutaneous)  5,000 Units Subcutaneous Q8H   hydrALAZINE  100 mg Oral TID   insulin aspart  0-5 Units Subcutaneous QHS   insulin aspart  0-9 Units Subcutaneous TID WC   insulin aspart  2 Units Subcutaneous TID WC   insulin glargine-yfgn  5 Units Subcutaneous Daily   NIFEdipine  30 mg Oral Daily   potassium chloride  40 mEq Oral BID   rosuvastatin  5 mg Oral Daily   senna-docusate  1 tablet Oral BID   Zetta Bills, MD 05/23/2023, 8:56 AM

## 2023-05-23 NOTE — Assessment & Plan Note (Signed)
-   Continue efforts at blood pressure control - Continue Crestor

## 2023-05-23 NOTE — Assessment & Plan Note (Signed)
At baseline the patient has no cognitive impairment.  Here as a result of her intraparenchymal hemorrhage and cerebellar edema she had confusion and disorientation.  This seems to be getting better with therapy. - PT/OT

## 2023-05-24 DIAGNOSIS — G9341 Metabolic encephalopathy: Secondary | ICD-10-CM | POA: Diagnosis not present

## 2023-05-24 DIAGNOSIS — N179 Acute kidney failure, unspecified: Secondary | ICD-10-CM | POA: Diagnosis not present

## 2023-05-24 DIAGNOSIS — I614 Nontraumatic intracerebral hemorrhage in cerebellum: Secondary | ICD-10-CM | POA: Diagnosis not present

## 2023-05-24 DIAGNOSIS — I1A0 Resistant hypertension: Secondary | ICD-10-CM | POA: Diagnosis not present

## 2023-05-24 LAB — GLUCOSE, CAPILLARY
Glucose-Capillary: 373 mg/dL — ABNORMAL HIGH (ref 70–99)
Glucose-Capillary: 346 mg/dL — ABNORMAL HIGH (ref 70–99)
Glucose-Capillary: 263 mg/dL — ABNORMAL HIGH (ref 70–99)
Glucose-Capillary: 159 mg/dL — ABNORMAL HIGH (ref 70–99)

## 2023-05-24 LAB — BASIC METABOLIC PANEL
Anion gap: 13 (ref 5–15)
BUN: 57 mg/dL — ABNORMAL HIGH (ref 8–23)
CO2: 20 mmol/L — ABNORMAL LOW (ref 22–32)
Calcium: 8.1 mg/dL — ABNORMAL LOW (ref 8.9–10.3)
Chloride: 102 mmol/L (ref 98–111)
Creatinine, Ser: 2.74 mg/dL — ABNORMAL HIGH (ref 0.44–1.00)
GFR, Estimated: 16 mL/min — ABNORMAL LOW (ref >60)
Glucose, Bld: 427 mg/dL — ABNORMAL HIGH (ref 70–99)
Potassium: 4 mmol/L (ref 3.5–5.1)
Sodium: 135 mmol/L (ref 135–145)

## 2023-05-24 LAB — CBC
HCT: 32.3 % — ABNORMAL LOW (ref 36.0–46.0)
Hemoglobin: 10.2 g/dL — ABNORMAL LOW (ref 12.0–15.0)
MCH: 30.4 pg (ref 26.0–34.0)
MCHC: 31.6 g/dL (ref 30.0–36.0)
MCV: 96.4 fL (ref 80.0–100.0)
Platelets: 156 10*3/uL (ref 150–400)
RBC: 3.35 MIL/uL — ABNORMAL LOW (ref 3.87–5.11)
RDW: 14.1 % (ref 11.5–15.5)
WBC: 5.7 10*3/uL (ref 4.0–10.5)
nRBC: 0 % (ref 0.0–0.2)

## 2023-05-24 MED ORDER — NIFEDIPINE ER OSMOTIC RELEASE 60 MG PO TB24
60.0000 mg | ORAL_TABLET | Freq: Every day | ORAL | Status: DC
  Administered 2023-05-24 – 2023-05-28 (×5): 60 mg via ORAL
  Filled 2023-05-24 (×5): qty 1

## 2023-05-24 MED ORDER — LACTATED RINGERS IV SOLN: INTRAVENOUS | Status: AC

## 2023-05-24 NOTE — Progress Notes (Addendum)
Patient ID: Emily Jennings, female   DOB: 1934-08-15, 89 y.o.   MRN: [id number] [facility] Progress Note   Assessment/ Plan:   1. Acute kidney Injury on chronic kidney disease stage III: Baseline creatinine around 1.6 (followed up by Dr. Wolfgang Phoenix).  Acute injury appears to be likely from a combination of hemodynamically mediated injury in the setting of hypertensive emergency/right cerebellar hemorrhage with fluctuations of renal perfusion along with contrast exposure on 8/28.  Remains nonoliguric and with labs pending from this morning. 2.  Hypertension: Blood pressures appears to be generally improving, will discontinue doxazosin and increase dose of nifedipine to help consolidate/trim antihypertensive therapy and improve adherence. 3.  Status post right cerebellar hemorrhage: Repeat imaging showed unchanged bleed with minimal mass effect and vasogenic edema.  Will continue efforts at blood pressure control <130/80. 4.  History of congestive heart failure: Continue furosemide for volume unloading after hypertonic saline administration for intracranial bleed along with blood pressure control. 5.  Hypokalemia: Secondary to diuretic induced losses, replaced via oral route yesterday and awaiting labs from this morning.  If this persists upon renal recovery, may be an opportunity to add RAAS blocker.  Subjective:   Denies any acute complaints at this time including chest pain or shortness of breath.  No vision issues.   Objective:   BP (!) 159/61 (BP Location: Left Arm)   Pulse 94   Temp 98.5 F (36.9 C) (Oral)   Resp 18   Ht 5' (1.524 m)   Wt 55.4 kg   SpO2 97%   BMI 23.85 kg/m   Intake/Output Summary (Last 24 hours) at 05/24/2023 0833 Last data filed at 05/24/2023 0607 Gross per 24 hour  Intake 320 ml  Output 1302 ml  Net -982 ml   Weight change:   Physical Exam: Gen: Comfortably resting in bed watching television CVS: Pulse regular rhythm, normal rate, S1-S2 normal Resp:  Clear to auscultation bilaterally, no distinct rales or rhonchi Abd: Soft, flat, nontender, bowel sounds normal Ext: Trace edema over lower extremities  Imaging: No results found.  Labs: BMET Recent Labs  Lab 05/18/23 0508 05/18/23 0800 05/19/23 0429 05/20/23 0403 05/21/23 0400 05/22/23 0736 05/23/23 0718  NA 149* 150* 143 138 137 139 141  K 3.7  --  3.8 4.2 3.9 2.9* 3.3*  CL 117*  --  111 110 107 105 103  CO2 20*  --  20* 17* 19* 23 23  GLUCOSE 158*  --  131* 169* 218* 196* 261*  BUN 36*  --  43* 49* 58* 58* 56*  CREATININE 2.16*  --  2.41* 2.78* 2.97* 2.79* 2.60*  CALCIUM 8.3*  --  8.9 8.3* 8.2* 8.1* 8.1*   CBC Recent Labs  Lab 05/20/23 0403 05/21/23 0400 05/22/23 0736 05/23/23 0718  WBC 9.8 7.9 6.1 5.5  HGB 9.6* 10.2* 10.0* 9.9*  HCT 29.9* 33.1* 31.1* 30.7*  MCV 94.6 97.6 97.2 95.9  PLT 157 157 143* 145*    Medications:     carvedilol  25 mg Oral BID WC   Chlorhexidine Gluconate Cloth  6 each Topical Q0600   chlorthalidone  25 mg Oral Daily   doxazosin  2 mg Oral Daily   heparin injection (subcutaneous)  5,000 Units Subcutaneous Q8H   hydrALAZINE  100 mg Oral TID   insulin aspart  0-5 Units Subcutaneous QHS   insulin aspart  0-9 Units Subcutaneous TID WC   insulin aspart  2 Units Subcutaneous TID WC   insulin glargine-yfgn  8 Units Subcutaneous Daily   NIFEdipine  30 mg Oral Daily   potassium chloride  40 mEq Oral BID   rosuvastatin  5 mg Oral Daily   senna-docusate  1 tablet Oral BID   Zetta Bills, MD 05/24/2023, 8:33 AM

## 2023-05-24 NOTE — Plan of Care (Signed)
  Problem: Education: Goal: Knowledge of disease or condition will improve Outcome: Progressing Goal: Knowledge of secondary prevention will improve (MUST DOCUMENT ALL) Outcome: Progressing Goal: Knowledge of patient specific risk factors will improve (Mark N/A or DELETE if not current risk factor) Outcome: Progressing   

## 2023-05-24 NOTE — Progress Notes (Signed)
  Progress Note   Patient: Emily Jennings XBJ:[id number] DOB: 24-Jul-1934 DOA: 05/16/2023     8 DOS: the patient was seen and examined on 05/24/2023        Brief hospital course: Emily Jennings is an 89 y.o. F with poorly controlled HTN, CKD IIIb baseline 1.4, DM dCHF and HLD who presented with dizziness and RUE ataxia.  Found to have right cerebellar hemorrhage.  Admitted on Cleviprex     Assessment and Plan: * Right cerebellar intraparenchymal hemorrhage due to hypertension - Continue Crestor - BP goal <160 in short term, <130 long term   AKI (acute kidney injury) (HCC) Cr slightly up today. - Hold chlorthalidone - IV fluids - Trend BMP  Resistant hypertension BP down to goal.   - Continue Coreg, doxazosin, hydralazine - Continue CCB - Hold chlorthalidone  Hyperlipidemia - Continue Crestor  Acute on chronic diastolic (congestive) heart failure (HCC) Resolved - Hold home furosemide  Acute metabolic encephalopathy Seems much better - PT/OT  Type 2 diabetes mellitus with stage 3a chronic kidney disease, with long-term current use of insulin (HCC) Glucose stable - Continue aspart corrections - Continue Semglee insulin  Hypokalemia Repleted          Subjective: No new complaints.  Had some incontinence of bowel today.  No fever, no respiratory symptoms, no nursing concerns.     Physical Exam: BP 124/60 (BP Location: Left Arm)   Pulse 72   Temp 98.5 F (36.9 C) (Oral)   Resp 18   Ht 5' (1.524 m)   Wt 55.4 kg   SpO2 100%   BMI 23.85 kg/m   Thin adult female, working with PT, appears weak and tired MSK with slightly diminished muscle mass and fat, no deformities No abdominal distension or ascites No LE edema Attentive to questions, mild psychomotor slowing, oriented to self, husband, hospital.  Face symmetric, gait unsteady, strength symmetric but globally weak     Data Reviewed: Metabolic panel shows worsening creatinine, stable K   Family  Communication: Husband, son and grandson at the bedside    Disposition: Status is: Inpatient         Author: Alberteen Sam, MD 05/24/2023 4:50 PM  For on call review [email or web address].

## 2023-05-24 NOTE — Progress Notes (Signed)
Physical Therapy Treatment Patient Details Name: Emily Jennings MRN: [id number] DOB: [birth year] Today's Date: 05/24/2023   History of Present Illness Pt is a 89 y/o female presenting on 8/28 with dizziness. CT found with nontraumatic intracerebral hemorrhage R cerebellum.  PMH includes: anxiety, breast cancer, carotid stenosis, DM, HTN, osteoporosis, insomnia.    PT Comments  Declined practicing with SPC, requests RW only due to fear of falling. With this device, pt was able to ambulate at a CGA level for safety, no overt  signs of LOB. She had an episode of bowel urgency and incontinence while walking in the room. Able to perform peri-care on her own after toileting with CGA for safety. Reviewed safety and precautions. She is oriented to location only (unsure of month and reports 1954 as year.) Will continue to follow and progress acutely during admission. Patient will continue to benefit from skilled physical therapy services to further improve independence with functional mobility.     If plan is discharge home, recommend the following: A little help with walking and/or transfers;A little help with bathing/dressing/bathroom;Assist for transportation;Direct supervision/assist for financial management;Direct supervision/assist for medications management;Assistance with cooking/housework;Help with stairs or ramp for entrance   Can travel by private vehicle        Equipment Recommendations  Rolling walker (2 wheels)    Recommendations for Other Services       Precautions / Restrictions Precautions Precautions: Fall Precaution Comments: BP goal < 160 per neurology Restrictions Weight Bearing Restrictions: No     Mobility  Bed Mobility               General bed mobility comments: In recliner    Transfers Overall transfer level: Needs assistance Equipment used: Rolling walker (2 wheels) Transfers: Sit to/from Stand Sit to Stand: Supervision           General transfer  comment: Supervision for safety to rise from recliner and BSC several times.    Ambulation/Gait Ambulation/Gait assistance: Contact guard assist Gait Distance (Feet): 55 Feet Assistive device: Rolling walker (2 wheels) Gait Pattern/deviations: Step-through pattern, Narrow base of support, Trunk flexed Gait velocity: decreased Gait velocity interpretation: <1.31 ft/sec, indicative of household ambulator   General Gait Details: Cues for upright posture. Pt declined to practice with SPC. Feels more confident with RW. Used this device at a CGA level for safety. Required VC to navigate in congested areas. Bowel urgency and incontinence shortly after ambulating away from recliner. Easily fatigued, HR in 80s, SpO2 100% on RA.   Stairs Stairs:  (Declined)           Wheelchair Mobility     Tilt Bed    Modified Rankin (Stroke Patients Only) Modified Rankin (Stroke Patients Only) Pre-Morbid Rankin Score: No symptoms Modified Rankin: Moderately severe disability     Balance Overall balance assessment: Needs assistance Sitting-balance support: No upper extremity supported, Feet supported Sitting balance-Leahy Scale: Good     Standing balance support: No upper extremity supported, During functional activity Standing balance-Leahy Scale: Fair Standing balance comment: Able to stand and perform pericare at CGA level.                            Cognition Arousal: Alert Behavior During Therapy: WFL for tasks assessed/performed Overall Cognitive Status: No family/caregiver present to determine baseline cognitive functioning Area of Impairment: Safety/judgement, Awareness, Memory, Following commands, Orientation  Orientation Level: Disoriented to, Time, Place, Situation Current Attention Level: Selective Memory: Decreased short-term memory, Decreased recall of precautions Following Commands: Follows one step commands consistently, Follows multi-step  commands inconsistently Safety/Judgement: Decreased awareness of safety, Decreased awareness of deficits Awareness: Emergent Problem Solving: Slow processing, Requires verbal cues          Exercises      General Comments General comments (skin integrity, edema, etc.): Episode of bowel incontinene with loose stool while ambulating.      Pertinent Vitals/Pain Pain Assessment Pain Assessment: No/denies pain    Home Living                          Prior Function            PT Goals (current goals can now be found in the care plan section) Acute Rehab PT Goals Patient Stated Goal: to walk and to go home PT Goal Formulation: With patient Time For Goal Achievement: 05/31/23 Potential to Achieve Goals: Good Progress towards PT goals: Progressing toward goals    Frequency    Min 1X/week      PT Plan      Co-evaluation              AM-PAC PT "6 Clicks" Mobility   Outcome Measure  Help needed turning from your back to your side while in a flat bed without using bedrails?: A Little Help needed moving from lying on your back to sitting on the side of a flat bed without using bedrails?: A Little Help needed moving to and from a bed to a chair (including a wheelchair)?: A Little Help needed standing up from a chair using your arms (e.g., wheelchair or bedside chair)?: A Little Help needed to walk in hospital room?: A Little Help needed climbing 3-5 steps with a railing? : A Lot 6 Click Score: 17    End of Session Equipment Utilized During Treatment: Gait belt Activity Tolerance: Patient tolerated treatment well Patient left: in chair;with call bell/phone within reach;with chair alarm set;with SCD's reapplied   PT Visit Diagnosis: Unsteadiness on feet (R26.81);Difficulty in walking, not elsewhere classified (R26.2)     Time: 5366-4403 PT Time Calculation (min) (ACUTE ONLY): 24 min  Charges:    $Gait Training: 8-22 mins $Therapeutic Activity:  8-22 mins PT General Charges $$ ACUTE PT VISIT: 1 Visit                     Kathlyn Sacramento, PT, DPT [facility]  Rehabilitation Services Physical Therapist Office: [phone] Website: [email or web address]    Berton Mount 05/24/2023, 10:54 AM

## 2023-05-24 NOTE — Plan of Care (Signed)
  Problem: Education: Goal: Knowledge of disease or condition will improve Outcome: Progressing Goal: Knowledge of secondary prevention will improve (MUST DOCUMENT ALL) Outcome: Progressing Goal: Knowledge of patient specific risk factors will improve Elta [place] N/A or DELETE if not current risk factor) Outcome: Progressing   Problem: Intracerebral Hemorrhage Tissue Perfusion: Goal: Complications of Intracerebral Hemorrhage will be minimized Outcome: Progressing   Problem: Coping: Goal: Will verbalize positive feelings about self Outcome: Progressing Goal: Will identify appropriate support needs Outcome: Progressing   Problem: Health Behavior/Discharge Planning: Goal: Ability to manage health-related needs will improve Outcome: Progressing Goal: Goals will be collaboratively established with patient/family Outcome: Progressing   Problem: Self-Care: Goal: Ability to participate in self-care as condition permits will improve Outcome: Progressing Goal: Verbalization of feelings and concerns over difficulty with self-care will improve Outcome: Progressing Goal: Ability to communicate needs accurately will improve Outcome: Progressing   Problem: Nutrition: Goal: Risk of aspiration will decrease Outcome: Progressing Goal: Dietary intake will improve Outcome: Progressing   Problem: Education: Goal: Ability to describe self-care measures that may prevent or decrease complications (Diabetes Survival Skills Education) will improve Outcome: Progressing Goal: Individualized Educational Video(s) Outcome: Progressing   Problem: Coping: Goal: Ability to adjust to condition or change in health will improve Outcome: Progressing   Problem: Fluid Volume: Goal: Ability to maintain a balanced intake and output will improve Outcome: Progressing   Problem: Health Behavior/Discharge Planning: Goal: Ability to identify and utilize available resources and services will improve Outcome:  Progressing Goal: Ability to manage health-related needs will improve Outcome: Progressing   Problem: Metabolic: Goal: Ability to maintain appropriate glucose levels will improve Outcome: Progressing   Problem: Nutritional: Goal: Maintenance of adequate nutrition will improve Outcome: Progressing Goal: Progress toward achieving an optimal weight will improve Outcome: Progressing   Problem: Skin Integrity: Goal: Risk for impaired skin integrity will decrease Outcome: Progressing   Problem: Tissue Perfusion: Goal: Adequacy of tissue perfusion will improve Outcome: Progressing   Problem: Education: Goal: Knowledge of General Education information will improve Description: Including pain rating scale, medication(s)/side effects and non-pharmacologic comfort measures Outcome: Progressing   Problem: Health Behavior/Discharge Planning: Goal: Ability to manage health-related needs will improve Outcome: Progressing   Problem: Clinical Measurements: Goal: Ability to maintain clinical measurements within normal limits will improve Outcome: Progressing Goal: Will remain free from infection Outcome: Progressing Goal: Diagnostic test results will improve Outcome: Progressing Goal: Respiratory complications will improve Outcome: Progressing Goal: Cardiovascular complication will be avoided Outcome: Progressing   Problem: Activity: Goal: Risk for activity intolerance will decrease Outcome: Progressing   Problem: Nutrition: Goal: Adequate nutrition will be maintained Outcome: Progressing   Problem: Coping: Goal: Level of anxiety will decrease Outcome: Progressing   Problem: Elimination: Goal: Will not experience complications related to bowel motility Outcome: Progressing Goal: Will not experience complications related to urinary retention Outcome: Progressing   Problem: Pain Managment: Goal: General experience of comfort will improve Outcome: Progressing   Problem:  Safety: Goal: Ability to remain free from injury will improve Outcome: Progressing   Problem: Skin Integrity: Goal: Risk for impaired skin integrity will decrease Outcome: Progressing

## 2023-05-24 NOTE — Progress Notes (Signed)
Occupational Therapy Treatment Patient Details Name: Emily Jennings MRN: [id number] DOB: 1934-08-22 Today's Date: 05/24/2023   History of present illness Pt is a 89 y/o female presenting on 8/28 with dizziness. CT found with nontraumatic intracerebral hemorrhage R cerebellum.  PMH includes: anxiety, breast cancer, carotid stenosis, DM, HTN, osteoporosis, insomnia.   OT comments  Focused session on pill box test to further assess functional cognition. Pt demonstrating some strategies to recall prescription label instructions but observed difficulty implementing proper sorting of pill box resulting in fail of this assessment. See cognition section for further details. Pt using RUE functionally though demonstrating weakness and impaired coordination with difficulty opening med bottles and picking up "pills". Recommend daily assist with med mgmt at DC due to cognitive impairments.         If plan is discharge home, recommend the following:  A little help with walking and/or transfers;A little help with bathing/dressing/bathroom;Direct supervision/assist for medications management;Direct supervision/assist for financial management;Assist for transportation;Assistance with cooking/housework;Supervision due to cognitive status;Help with stairs or ramp for entrance   Equipment Recommendations  BSC/3in1;Other (comment) (RW)    Recommendations for Other Services      Precautions / Restrictions Precautions Precautions: Fall Precaution Comments: BP goal < 160 per neurology Restrictions Weight Bearing Restrictions: No       Mobility Bed Mobility               General bed mobility comments: In recliner    Transfers                         Balance                                           ADL either performed or assessed with clinical judgement   ADL Overall ADL's : Needs assistance/impaired                                       General  ADL Comments: Focus on pill box test assessment, functional use of RUE and problem solving.    Extremity/Trunk Assessment Upper Extremity Assessment Upper Extremity Assessment: Right hand dominant RUE Deficits / Details: minor impairments in sensation, coordination, strength but using this UE functionally. unable to open pill bottles   Lower Extremity Assessment Lower Extremity Assessment: Defer to PT evaluation        Vision   Vision Assessment?: No apparent visual deficits   Perception     Praxis      Cognition Arousal: Lethargic, Alert Behavior During Therapy: WFL for tasks assessed/performed Overall Cognitive Status: Impaired/Different from baseline Area of Impairment: Safety/judgement, Awareness, Memory, Following commands                   Current Attention Level: Selective Memory: Decreased short-term memory, Decreased recall of precautions Following Commands: Follows one step commands consistently, Follows multi-step commands inconsistently Safety/Judgement: Decreased awareness of safety, Decreased awareness of deficits Awareness: Intellectual, Emergent Problem Solving: Slow processing, Requires verbal cues General Comments: pt reporting sleepiness today but appropriately responsive during session. Functional cognition further assessed with The Pillbox Test: A Measure of Executive Functioning and Estimate of Medication Management. A straight pass/fail designation is determined by 3 or more errors of omission or misplacement on the task (with each "pill".  Pt had a total of > 18 errors.(>18 "pills" placed in incorrect section of pill box) and failed the assessment, demonstrating deficits with planning, mental flexibility, suboptimal search strategies, concrete thinking and difficulty with multitasking. Pt did demo some strategies of repeating the label instructions back to herself as a reminder but difficulty implementing these instructions with medications that were not  just 1x/day. Educated pt to have assist with meds at home.        Exercises      Shoulder Instructions       General Comments Episode of bowel incontinene with loose stool while ambulating.    Pertinent Vitals/ Pain       Pain Assessment Pain Assessment: No/denies pain  Home Living                                          Prior Functioning/Environment              Frequency  Min 1X/week        Progress Toward Goals  OT Goals(current goals can now be found in the care plan section)  Progress towards OT goals: Progressing toward goals  Acute Rehab OT Goals Patient Stated Goal: get a nap OT Goal Formulation: With patient/family Time For Goal Achievement: 06/01/23 Potential to Achieve Goals: Good ADL Goals Pt Will Perform Grooming: with supervision;standing Pt Will Perform Lower Body Dressing: with supervision;sit to/from stand Pt Will Transfer to Toilet: with supervision;ambulating;bedside commode Pt Will Perform Toileting - Clothing Manipulation and hygiene: with modified independence;sit to/from stand Pt Will Perform Tub/Shower Transfer: Tub transfer;with supervision;shower seat Pt/caregiver will Perform Home Exercise Program: Increased strength;Right Upper extremity Additional ADL Goal #1: Pt will complete pill box test with less than 3 errors.  Plan      Co-evaluation                 AM-PAC OT "6 Clicks" Daily Activity     Outcome Measure   Help from another person eating meals?: A Little Help from another person taking care of personal grooming?: A Little Help from another person toileting, which includes using toliet, bedpan, or urinal?: A Little Help from another person bathing (including washing, rinsing, drying)?: A Little Help from another person to put on and taking off regular upper body clothing?: A Little Help from another person to put on and taking off regular lower body clothing?: A Little 6 Click Score: 18     End of Session    OT Visit Diagnosis: Other abnormalities of gait and mobility (R26.89);Muscle weakness (generalized) (M62.81);Other symptoms and signs involving cognitive function   Activity Tolerance Patient tolerated treatment well;Patient limited by fatigue   Patient Left in chair;with call bell/phone within reach;with chair alarm set   Nurse Communication          Time: 8119-1478 OT Time Calculation (min): 21 min  Charges: OT General Charges $OT Visit: 1 Visit OT Treatments $Self Care/Home Management : 8-22 mins  Bradd Canary, OTR/L Acute Rehab Services Office: [phone]   Lorre Munroe 05/24/2023, 11:15 AM

## 2023-05-25 DIAGNOSIS — N179 Acute kidney failure, unspecified: Secondary | ICD-10-CM | POA: Diagnosis not present

## 2023-05-25 DIAGNOSIS — I614 Nontraumatic intracerebral hemorrhage in cerebellum: Secondary | ICD-10-CM | POA: Diagnosis not present

## 2023-05-25 DIAGNOSIS — I1A0 Resistant hypertension: Secondary | ICD-10-CM | POA: Diagnosis not present

## 2023-05-25 DIAGNOSIS — G9341 Metabolic encephalopathy: Secondary | ICD-10-CM | POA: Diagnosis not present

## 2023-05-25 LAB — CBC
HCT: 29 % — ABNORMAL LOW (ref 36.0–46.0)
Hemoglobin: 9.4 g/dL — ABNORMAL LOW (ref 12.0–15.0)
MCH: 31.4 pg (ref 26.0–34.0)
MCHC: 32.4 g/dL (ref 30.0–36.0)
MCV: 97 fL (ref 80.0–100.0)
Platelets: 160 10*3/uL (ref 150–400)
RBC: 2.99 MIL/uL — ABNORMAL LOW (ref 3.87–5.11)
RDW: 13.8 % (ref 11.5–15.5)
WBC: 6.1 10*3/uL (ref 4.0–10.5)
nRBC: 0 % (ref 0.0–0.2)

## 2023-05-25 LAB — GLUCOSE, CAPILLARY
Glucose-Capillary: 168 mg/dL — ABNORMAL HIGH (ref 70–99)
Glucose-Capillary: 176 mg/dL — ABNORMAL HIGH (ref 70–99)
Glucose-Capillary: 200 mg/dL — ABNORMAL HIGH (ref 70–99)
Glucose-Capillary: 282 mg/dL — ABNORMAL HIGH (ref 70–99)
Glucose-Capillary: 50 mg/dL — ABNORMAL LOW (ref 70–99)
Glucose-Capillary: 66 mg/dL — ABNORMAL LOW (ref 70–99)
Glucose-Capillary: 94 mg/dL (ref 70–99)

## 2023-05-25 LAB — BASIC METABOLIC PANEL
Anion gap: 7 (ref 5–15)
BUN: 51 mg/dL — ABNORMAL HIGH (ref 8–23)
CO2: 26 mmol/L (ref 22–32)
Calcium: 8 mg/dL — ABNORMAL LOW (ref 8.9–10.3)
Chloride: 101 mmol/L (ref 98–111)
Creatinine, Ser: 2.3 mg/dL — ABNORMAL HIGH (ref 0.44–1.00)
GFR, Estimated: 20 mL/min — ABNORMAL LOW (ref >60)
Glucose, Bld: 147 mg/dL — ABNORMAL HIGH (ref 70–99)
Potassium: 3.6 mmol/L (ref 3.5–5.1)
Sodium: 134 mmol/L — ABNORMAL LOW (ref 135–145)

## 2023-05-25 NOTE — Plan of Care (Signed)
  Problem: Education: Goal: Knowledge of disease or condition will improve Outcome: Progressing Goal: Knowledge of secondary prevention will improve (MUST DOCUMENT ALL) Outcome: Progressing Goal: Knowledge of patient specific risk factors will improve Elta [place] N/A or DELETE if not current risk factor) Outcome: Progressing   Problem: Intracerebral Hemorrhage Tissue Perfusion: Goal: Complications of Intracerebral Hemorrhage will be minimized Outcome: Progressing   Problem: Coping: Goal: Will verbalize positive feelings about self Outcome: Progressing Goal: Will identify appropriate support needs Outcome: Progressing   Problem: Health Behavior/Discharge Planning: Goal: Ability to manage health-related needs will improve Outcome: Progressing Goal: Goals will be collaboratively established with patient/family Outcome: Progressing   Problem: Self-Care: Goal: Ability to participate in self-care as condition permits will improve Outcome: Progressing Goal: Verbalization of feelings and concerns over difficulty with self-care will improve Outcome: Progressing Goal: Ability to communicate needs accurately will improve Outcome: Progressing   Problem: Nutrition: Goal: Risk of aspiration will decrease Outcome: Progressing Goal: Dietary intake will improve Outcome: Progressing   Problem: Education: Goal: Ability to describe self-care measures that may prevent or decrease complications (Diabetes Survival Skills Education) will improve Outcome: Progressing Goal: Individualized Educational Video(s) Outcome: Progressing   Problem: Coping: Goal: Ability to adjust to condition or change in health will improve Outcome: Progressing   Problem: Fluid Volume: Goal: Ability to maintain a balanced intake and output will improve Outcome: Progressing   Problem: Health Behavior/Discharge Planning: Goal: Ability to identify and utilize available resources and services will improve Outcome:  Progressing Goal: Ability to manage health-related needs will improve Outcome: Progressing   Problem: Metabolic: Goal: Ability to maintain appropriate glucose levels will improve Outcome: Progressing   Problem: Nutritional: Goal: Maintenance of adequate nutrition will improve Outcome: Progressing Goal: Progress toward achieving an optimal weight will improve Outcome: Progressing   Problem: Skin Integrity: Goal: Risk for impaired skin integrity will decrease Outcome: Progressing   Problem: Tissue Perfusion: Goal: Adequacy of tissue perfusion will improve Outcome: Progressing   Problem: Education: Goal: Knowledge of General Education information will improve Description: Including pain rating scale, medication(s)/side effects and non-pharmacologic comfort measures Outcome: Progressing   Problem: Health Behavior/Discharge Planning: Goal: Ability to manage health-related needs will improve Outcome: Progressing   Problem: Clinical Measurements: Goal: Ability to maintain clinical measurements within normal limits will improve Outcome: Progressing Goal: Will remain free from infection Outcome: Progressing Goal: Diagnostic test results will improve Outcome: Progressing Goal: Respiratory complications will improve Outcome: Progressing Goal: Cardiovascular complication will be avoided Outcome: Progressing   Problem: Activity: Goal: Risk for activity intolerance will decrease Outcome: Progressing   Problem: Nutrition: Goal: Adequate nutrition will be maintained Outcome: Progressing   Problem: Coping: Goal: Level of anxiety will decrease Outcome: Progressing   Problem: Elimination: Goal: Will not experience complications related to bowel motility Outcome: Progressing Goal: Will not experience complications related to urinary retention Outcome: Progressing   Problem: Pain Managment: Goal: General experience of comfort will improve Outcome: Progressing   Problem:  Safety: Goal: Ability to remain free from injury will improve Outcome: Progressing   Problem: Skin Integrity: Goal: Risk for impaired skin integrity will decrease Outcome: Progressing

## 2023-05-25 NOTE — TOC Progression Note (Signed)
Transition of Care [facility]) - Progression Note    Patient Details  Name: TEAL TREVATHAN MRN: [id number] Date of Birth: [birth year]  Transition of Care [facility]) CM/SW Contact  Kermit Balo, RN Phone Number: 05/25/2023, 10:14 AM  Clinical Narrative:     Pt will d/c home with spouse and home health services through [facility] when medically ready.  TOC following.  Expected Discharge Plan: Home w Home Health Services Barriers to Discharge: Continued Medical Work up  Expected Discharge Plan and Services   Discharge Planning Services: CM Consult   Living arrangements for the past 2 months: Single Family Home                                       Social Determinants of Health (SDOH) Interventions SDOH Screenings   Food Insecurity: No Food Insecurity (05/22/2023)  Housing: Low Risk  (05/22/2023)  Transportation Needs: No Transportation Needs (05/22/2023)  Utilities: Not At Risk (05/22/2023)  Alcohol Screen: Low Risk  (11/15/2022)  Depression (PHQ2-9): Low Risk  (05/16/2023)  Financial Resource Strain: Low Risk  (12/28/2022)  Physical Activity: Sufficiently Active (11/15/2022)  Social Connections: Moderately Integrated (11/15/2022)  Stress: No Stress Concern Present (12/28/2022)  Tobacco Use: Low Risk  (05/16/2023)    Readmission Risk Interventions    02/19/2023    8:06 AM  Readmission Risk Prevention Plan  Transportation Screening Complete  HRI or Home Care Consult Complete  Social Work Consult for Recovery Care Planning/Counseling Complete  Palliative Care Screening Not Applicable  Medication Review [employer]) Complete

## 2023-05-25 NOTE — Progress Notes (Signed)
Patient ID: Emily Jennings, female   DOB: July 06, 1934, 89 y.o.   MRN: [id number] [facility] Progress Note   Assessment/ Plan:   1. Acute kidney Injury on chronic kidney disease stage III: Baseline creatinine around 1.6 (followed up by Dr. Wolfgang Phoenix).  Acute injury appears to be likely from a combination of hemodynamically mediated injury in the setting of hypertensive emergency/right cerebellar hemorrhage with fluctuations of renal perfusion along with contrast exposure on 8/28.  Labs indicate some improvement of renal function after administration of IV fluids (raising concern that creatinine rise may have been a combination of poor intake/recently started thiazide).  Defer restarting thiazide until after renal function is back to baseline; this may or may not be in combination with RAAS based on her blood pressures. 2.  Hypertension: Blood pressures appears to be generally improving, transiently elevated yesterday possibly related to sodium loading from IV fluids.  Monitor on current therapy that we are attempting to streamline. 3.  Status post right cerebellar hemorrhage: Repeat imaging showed unchanged bleed with minimal mass effect and vasogenic edema.  Will continue efforts at blood pressure control <130/80. 4.  History of congestive heart failure: Continue furosemide for volume unloading after hypertonic saline administration for intracranial bleed along with blood pressure control.  Nephrology will sign off at this time and remain above for questions or concerns.  Recommend continued nephrology follow-up/surveillance by Dr. Wolfgang Phoenix upon discharge.  Subjective:   She denies any acute events overnight and feels that she is eating/drinking appropriately.  Oral intake encouraged.   Objective:   BP (!) 161/66   Pulse 73   Temp 98.3 F (36.8 C) (Oral)   Resp 18   Ht 5' (1.524 m)   Wt 55.4 kg   SpO2 100%   BMI 23.85 kg/m   Intake/Output Summary (Last 24 hours) at 05/25/2023  0920 Last data filed at 05/25/2023 0600 Gross per 24 hour  Intake 240 ml  Output --  Net 240 ml   Weight change:   Physical Exam: Gen: Resting comfortably in bed, working on breakfast CVS: Pulse regular rhythm, normal rate, S1-S2 normal Resp: Clear to auscultation bilaterally, no distinct rales or rhonchi Abd: Soft, flat, nontender, bowel sounds normal Ext: Trace edema over lower extremities  Imaging: No results found.  Labs: BMET Recent Labs  Lab 05/19/23 0429 05/20/23 0403 05/21/23 0400 05/22/23 0736 05/23/23 0718 05/24/23 0922 05/25/23 0545  NA 143 138 137 139 141 135 134*  K 3.8 4.2 3.9 2.9* 3.3* 4.0 3.6  CL 111 110 107 105 103 102 101  CO2 20* 17* 19* 23 23 20* 26  GLUCOSE 131* 169* 218* 196* 261* 427* 147*  BUN 43* 49* 58* 58* 56* 57* 51*  CREATININE 2.41* 2.78* 2.97* 2.79* 2.60* 2.74* 2.30*  CALCIUM 8.9 8.3* 8.2* 8.1* 8.1* 8.1* 8.0*   CBC Recent Labs  Lab 05/22/23 0736 05/23/23 0718 05/24/23 0922 05/25/23 0545  WBC 6.1 5.5 5.7 6.1  HGB 10.0* 9.9* 10.2* 9.4*  HCT 31.1* 30.7* 32.3* 29.0*  MCV 97.2 95.9 96.4 97.0  PLT 143* 145* 156 160    Medications:     carvedilol  25 mg Oral BID WC   Chlorhexidine Gluconate Cloth  6 each Topical Q0600   heparin injection (subcutaneous)  5,000 Units Subcutaneous Q8H   hydrALAZINE  100 mg Oral TID   insulin aspart  0-5 Units Subcutaneous QHS   insulin aspart  0-9 Units Subcutaneous TID WC   insulin aspart  2 Units Subcutaneous  TID WC   insulin glargine-yfgn  8 Units Subcutaneous Daily   NIFEdipine  60 mg Oral Daily   rosuvastatin  5 mg Oral Daily   senna-docusate  1 tablet Oral BID   Zetta Bills, MD 05/25/2023, 9:20 AM

## 2023-05-25 NOTE — Progress Notes (Signed)
Hypoglycemic Event  CBG: 50  Treatment: 8 oz juice/soda  Symptoms: None  Follow-up CBG: Time:2308 CBG Result:66  Possible Reasons for Event: Unknown  Comments/MD notified:Protocol used.     Emily Jennings

## 2023-05-25 NOTE — Progress Notes (Signed)
Speech Language Pathology Treatment: Cognitive-Linquistic  Patient Details Name: Emily Jennings MRN: [id number] DOB: 1933/11/09 Today's Date: 05/25/2023 Time: 7425-9563 SLP Time Calculation (min) (ACUTE ONLY): 15 min  Assessment / Plan / Recommendation Clinical Impression  Treatment focused on dysarthria as MD questioning if patient was more dysarthric than typical during his interaction. Patient alert and pleasant. Demonstrating intellectual awareness of dysarthria and reporting that she speaks quickly at baseline however unable to recall or verbalize compensatory strategies taught during most recent therapy session. SLP provided written cues today for compensatory strategies including slow rate, loud intensity, over-articulation, and pausing as needed as a portion of her dysarthria is caused by decreased breath support. Patient able to use these strategies during reading aloud at the sentence level and during conversation with initial moderate cueing, fading to min by the end of session. This SLP does not feel that there has been a decline in speech intelligibility. It is reasonable that dysarthria may worsen if patient fatigued or speaking too quickly.    HPI HPI: Pt is an 89 y.o. female who presented on 05/16/23 with dizziness. CT revealed nontraumatic intrracerebral hemorrhage R cerebellun. Signigicant PMH includes HTN, coronary artery disease, hyperglycemia, diabetes, breast cancer, and anxiety.      SLP Plan  Continue with current plan of care     Recommendations for follow up therapy are one component of a multi-disciplinary discharge planning process, led by the attending physician.  Recommendations may be updated based on patient status, additional functional criteria and insurance authorization.                     Oral care BID;Staff/trained caregiver to provide oral care     Dysarthria and anarthria (R47.1)     Continue with current plan of care    [facility] MA,  CCC-SLP  Emily Jennings  05/25/2023, 11:37 AM

## 2023-05-25 NOTE — Progress Notes (Signed)
Physical Therapy Treatment Patient Details Name: Emily Jennings MRN: [id number] DOB: 07-11-1934 Today's Date: 05/25/2023   History of Present Illness Pt is a 89 y/o female presenting on 8/28 with dizziness. CT found with nontraumatic intracerebral hemorrhage R cerebellum.  PMH includes: anxiety, breast cancer, carotid stenosis, DM, HTN, osteoporosis, insomnia.    PT Comments  Slight improvement in cognitive ability and gross mobility today. Reports feeling that she has more energy to ambulate longer distances. Still prefers to use RW for support/confidence. Minor instability when backing up to recliner which requires CGA and reviewed safety/awareness and techniques for safer transitions. Patient will continue to benefit from skilled physical therapy services to further improve independence with functional mobility.     If plan is discharge home, recommend the following: A little help with walking and/or transfers;A little help with bathing/dressing/bathroom;Assist for transportation;Direct supervision/assist for financial management;Direct supervision/assist for medications management;Assistance with cooking/housework;Help with stairs or ramp for entrance   Can travel by private vehicle        Equipment Recommendations  Rolling walker (2 wheels)    Recommendations for Other Services       Precautions / Restrictions Precautions Precautions: Fall Precaution Comments: BP goal < 160 per neurology Restrictions Weight Bearing Restrictions: No     Mobility  Bed Mobility               General bed mobility comments: On BSC when PT entered room.    Transfers Overall transfer level: Needs assistance Equipment used: Rolling walker (2 wheels) Transfers: Sit to/from Stand Sit to Stand: Supervision           General transfer comment: Supervision for safety. VC for hand placement. Able to perform from several different surfaces today.    Ambulation/Gait Ambulation/Gait  assistance: Contact guard assist Gait Distance (Feet): 65 Feet Assistive device: Rolling walker (2 wheels) Gait Pattern/deviations: Step-through pattern, Narrow base of support, Trunk flexed Gait velocity: decreased Gait velocity interpretation: <1.31 ft/sec, indicative of household ambulator   General Gait Details: Showing some improved RW control. CGA for safety without signs of buckling. Backing up shows instability. Reviewed and practiced this with her in room. Feels like she has more energy today.   Stairs             Wheelchair Mobility     Tilt Bed    Modified Rankin (Stroke Patients Only) Modified Rankin (Stroke Patients Only) Pre-Morbid Rankin Score: No symptoms Modified Rankin: Moderately severe disability     Balance Overall balance assessment: Needs assistance Sitting-balance support: No upper extremity supported, Feet supported Sitting balance-Leahy Scale: Good     Standing balance support: No upper extremity supported, During functional activity Standing balance-Leahy Scale: Fair Standing balance comment: Able to stand and perform pericare at CGA level.                            Cognition Arousal: Alert Behavior During Therapy: WFL for tasks assessed/performed Overall Cognitive Status: No family/caregiver present to determine baseline cognitive functioning Area of Impairment: Memory, Orientation, Problem solving                 Orientation Level: Disoriented to, Time ("1954")   Memory: Decreased short-term memory Following Commands: Follows one step commands consistently, Follows one step commands inconsistently     Problem Solving: Slow processing, Requires verbal cues General Comments: Recalls 2/3 words at end of session        Exercises  General Comments        Pertinent Vitals/Pain Pain Assessment Pain Assessment: No/denies pain    Home Living                          Prior Function             PT Goals (current goals can now be found in the care plan section) Acute Rehab PT Goals Patient Stated Goal: to walk and to go home PT Goal Formulation: With patient Time For Goal Achievement: 05/31/23 Potential to Achieve Goals: Good Progress towards PT goals: Progressing toward goals    Frequency    Min 1X/week      PT Plan      Co-evaluation              AM-PAC PT "6 Clicks" Mobility   Outcome Measure  Help needed turning from your back to your side while in a flat bed without using bedrails?: A Little Help needed moving from lying on your back to sitting on the side of a flat bed without using bedrails?: A Little Help needed moving to and from a bed to a chair (including a wheelchair)?: A Little Help needed standing up from a chair using your arms (e.g., wheelchair or bedside chair)?: A Little Help needed to walk in hospital room?: A Little Help needed climbing 3-5 steps with a railing? : A Lot 6 Click Score: 17    End of Session Equipment Utilized During Treatment: Gait belt Activity Tolerance: Patient tolerated treatment well Patient left: in chair;with call bell/phone within reach;with chair alarm set;with SCD's reapplied (ST in room) Nurse Communication: Mobility status PT Visit Diagnosis: Unsteadiness on feet (R26.81);Difficulty in walking, not elsewhere classified (R26.2)     Time: 4098-1191 PT Time Calculation (min) (ACUTE ONLY): 11 min  Charges:    $Gait Training: 8-22 mins PT General Charges $$ ACUTE PT VISIT: 1 Visit                     Kathlyn Sacramento, PT, DPT [facility] Physical Therapist Office: [phone] Website: [email or web address]    Berton Mount 05/25/2023, 1:14 PM

## 2023-05-25 NOTE — Progress Notes (Signed)
  Progress Note   Patient: Emily Jennings [id number] DOB: 07/06/1934 DOA: 05/16/2023     9 DOS: the patient was seen and examined on 05/25/2023 at 10:45 AM      Brief hospital course: Mrs. Emily Jennings is an 89 y.o. F with poorly controlled HTN, CKD IIIb baseline 1.4, DM dCHF and HLD who presented with dizziness and RUE ataxia.  Found to have right cerebellar hemorrhage.  Admitted on Cleviprex     Assessment and Plan: * Right cerebellar intraparenchymal hemorrhage due to hypertension -Continue Crestor    AKI (acute kidney injury) (HCC) Creatinine down to 2.3 today - Hold IV fluids - Hold thiazide - Check BMP tomorrow  Resistant hypertension -Continue Coreg, hydralazine, nifedipine  Hyperlipidemia - Continue Crestor  Acute on chronic diastolic (congestive) heart failure (HCC) -Hold furosemide for now, monitor renal function, possibly a little bit tomorrow or Sunday  Acute metabolic encephalopathy Seems back to baseline - Continue PT/OT  Type 2 diabetes mellitus with stage 3a chronic kidney disease, with long-term current use of insulin (HCC) -Continue Semglee - Continue aspart corrections  Hypokalemia           Subjective: No new complaints, says she is drinking well, no swelling, dyspnea, no nursing concerns     Physical Exam: BP (!) 170/70   Pulse 66   Temp 98 F (36.7 C)   Resp 18   Ht 5' (1.524 m)   Wt 55.4 kg   SpO2 100%   BMI 23.85 kg/m   Thin elderly female, sitting up in recliner, interactive and pleasant RRR, no murmurs, no peripheral edema Respiratory normal, lungs clear without rales or wheezes Abdomen soft no tenderness palpation or guarding, no ascites or distention Neurological exam stable from yesterday, still dysarthric and generally weak but no new focal weakness or numbness    Data Reviewed: Discussed with nephrology Basic metabolic panel shows improved creatinine   Family Communication: Called husband, left  voicemail    Disposition: Status is: Inpatient         Author: Alberteen Sam, MD 05/25/2023 6:19 PM  For on call review [email or web address].

## 2023-05-26 ENCOUNTER — Inpatient Hospital Stay (HOSPITAL_COMMUNITY): Payer: Medicare Other

## 2023-05-26 DIAGNOSIS — N179 Acute kidney failure, unspecified: Secondary | ICD-10-CM | POA: Diagnosis not present

## 2023-05-26 DIAGNOSIS — G9341 Metabolic encephalopathy: Secondary | ICD-10-CM | POA: Diagnosis not present

## 2023-05-26 DIAGNOSIS — I1A0 Resistant hypertension: Secondary | ICD-10-CM | POA: Diagnosis not present

## 2023-05-26 DIAGNOSIS — I614 Nontraumatic intracerebral hemorrhage in cerebellum: Secondary | ICD-10-CM | POA: Diagnosis not present

## 2023-05-26 LAB — BASIC METABOLIC PANEL
Anion gap: 8 (ref 5–15)
BUN: 57 mg/dL — ABNORMAL HIGH (ref 8–23)
CO2: 25 mmol/L (ref 22–32)
Calcium: 8 mg/dL — ABNORMAL LOW (ref 8.9–10.3)
Chloride: 97 mmol/L — ABNORMAL LOW (ref 98–111)
Creatinine, Ser: 2.57 mg/dL — ABNORMAL HIGH (ref 0.44–1.00)
GFR, Estimated: 18 mL/min — ABNORMAL LOW (ref >60)
Glucose, Bld: 296 mg/dL — ABNORMAL HIGH (ref 70–99)
Potassium: 3.8 mmol/L (ref 3.5–5.1)
Sodium: 130 mmol/L — ABNORMAL LOW (ref 135–145)

## 2023-05-26 LAB — GLUCOSE, CAPILLARY
Glucose-Capillary: 363 mg/dL — ABNORMAL HIGH (ref 70–99)
Glucose-Capillary: 326 mg/dL — ABNORMAL HIGH (ref 70–99)
Glucose-Capillary: 244 mg/dL — ABNORMAL HIGH (ref 70–99)
Glucose-Capillary: 144 mg/dL — ABNORMAL HIGH (ref 70–99)
Glucose-Capillary: 91 mg/dL (ref 70–99)

## 2023-05-26 NOTE — Progress Notes (Signed)
  Progress Note   Patient: Emily Jennings [id number] DOB: [birth year] DOA: 05/16/2023     10 DOS: the patient was see n and examined on 05/26/2023        Brief hospital course: Mrs. Ocejo is an 89 y.o. F with poorly controlled HTN, CKD IIIb baseline 1.4, DM dCHF and HLD who presented with dizziness and RUE ataxia.  Found to have right cerebellar hemorrhage.  Admitted on Cleviprex     Assessment and Plan: * Right cerebellar intraparenchymal hemorrhage due to hypertension - Continue Coreg, hydralazine, nifedipine, Crestor  AKI (acute kidney injury) (HCC) Creatinine trending up - Avoid chlorthalidone - Oral hydration  Resistant hypertension Blood pressure improved - Continue Coreg, hydralazine, nifedipine  Hyperlipidemia - Continue Crestor   Acute on chronic diastolic (congestive) heart failure (HCC) - Hold furosemide for now given AKI, but low threshold to resume  Acute metabolic encephalopathy Seems to be improving - Continue PT/OT - Avoid beers criteria medicines  Type 2 diabetes mellitus with stage 3a chronic kidney disease, with long-term current use of insulin (HCC) Glucose mostly okay - Continue aspart corrections - Continue Semglee  Hypokalemia            Subjective: Patient feels okay, short of breath overnight but no symptoms this morning.  Denies swelling but looks edematous on exam to me.  No focal weakness, numbness.  No increasing confusion, loss of consciousness, fever.     Physical Exam: BP 139/69 (BP Location: Left Arm)   Pulse 65   Temp 98.2 F (36.8 C) (Oral)   Resp 17   Ht 5' (1.524 m)   Wt 55.4 kg   SpO2 99%   BMI 23.85 kg/m   Thin elderly female, sitting in recliner, interactive, tired RRR, soft systolic murmur noted, no peripheral edema Respiratory rate normal, lungs clear without rales or wheezes Abdomen soft without tenderness palpation or guarding Heart dysarthria unchanged, generally weak, but no focal weakness or numbness,  oriented to self, hospital, Dr.  Tempie Donning Reviewed: Patient metabolic panel shows creatinine back up to 2.5      Disposition: Status is: Inpatient         Author: Alberteen Sam, MD 05/26/2023 2:58 PM  For on call review [email or web address].

## 2023-05-26 NOTE — Progress Notes (Signed)
Patient complains pf shortness of breath while in bed. Vitals stable. Patient requests to sit in chair. Transferred to chair with no issue. Joneen Roach, MD notified. Chest xray ordered.

## 2023-05-27 DIAGNOSIS — I1A0 Resistant hypertension: Secondary | ICD-10-CM | POA: Diagnosis not present

## 2023-05-27 DIAGNOSIS — G9341 Metabolic encephalopathy: Secondary | ICD-10-CM | POA: Diagnosis not present

## 2023-05-27 DIAGNOSIS — N179 Acute kidney failure, unspecified: Secondary | ICD-10-CM | POA: Diagnosis not present

## 2023-05-27 DIAGNOSIS — I614 Nontraumatic intracerebral hemorrhage in cerebellum: Secondary | ICD-10-CM | POA: Diagnosis not present

## 2023-05-27 LAB — GLUCOSE, CAPILLARY
Glucose-Capillary: 242 mg/dL — ABNORMAL HIGH (ref 70–99)
Glucose-Capillary: 281 mg/dL — ABNORMAL HIGH (ref 70–99)
Glucose-Capillary: 101 mg/dL — ABNORMAL HIGH (ref 70–99)

## 2023-05-27 LAB — BASIC METABOLIC PANEL
Anion gap: 8 (ref 5–15)
BUN: 53 mg/dL — ABNORMAL HIGH (ref 8–23)
CO2: 26 mmol/L (ref 22–32)
Calcium: 8 mg/dL — ABNORMAL LOW (ref 8.9–10.3)
Chloride: 96 mmol/L — ABNORMAL LOW (ref 98–111)
Creatinine, Ser: 2.41 mg/dL — ABNORMAL HIGH (ref 0.44–1.00)
GFR, Estimated: 19 mL/min — ABNORMAL LOW (ref >60)
Glucose, Bld: 291 mg/dL — ABNORMAL HIGH (ref 70–99)
Potassium: 4.2 mmol/L (ref 3.5–5.1)
Sodium: 130 mmol/L — ABNORMAL LOW (ref 135–145)

## 2023-05-27 MED ORDER — HYDROCHLOROTHIAZIDE 12.5 MG PO TABS
12.5000 mg | ORAL_TABLET | Freq: Every day | ORAL | Status: DC
  Administered 2023-05-27: 12.5 mg via ORAL
  Filled 2023-05-27: qty 1

## 2023-05-27 NOTE — Progress Notes (Signed)
  Progress Note   Patient: Emily Jennings [id number]:[id number] DOB: Oct 02, 1933 DOA: 05/16/2023     11 DOS: the patient was seen and examined on 05/27/2023 at 9:35AM      Brief hospital course: Emily Jennings is an 89 y.o. F with poorly controlled HTN, CKD IIIb baseline 1.4, DM dCHF and HLD who presented with dizziness and RUE ataxia.  Found to have right cerebellar hemorrhage.  Admitted on Cleviprex     Assessment and Plan: * Right cerebellar intraparenchymal hemorrhage due to hypertension  Cerebellar edema Admitted with 2 cm right cerebellar hemorrhage.  CT angiogram of the head and neck showed no LVO, no high-grade stenosis of carotids or intracranial arteries.  Repeat CT head stable.  She was treated with hypertonic saline and cerebellar edema improved.    Echocardiogram showed dilated LA, no cardiogenic source of embolism.  LDL 65.  Hemoglobin A1c 7.5%.  Continue Crestor.  Aspirin contraindicated due to ICH.  Neurologically improving, PT evaluation ordered home health.  -Continue Coreg, hydralazine, nifedipine - Start HCTZ - Continue Crestor    Acute on chronic diastolic (congestive) heart failure (HCC) Not present on admission.  EF 65-70%.    During admission, developed shortness of breath, chest x-ray showed edema.  She was diuresed with IV Lasix and this improved.  Starting to look swollen again Discussed with nephrology - Hold Lasix - Start hydrochlorothiazide   AKI (acute kidney injury) (HCC) Baseline renal function is CKD stage IIIb, creatinine 1.6  Here creatinine is worsened to the 2.9 range.  Suspect this is likely due to contrast-induced nephropathy and hemodynamics.  Urine output is good, but she has developed peripheral edema, discussed diuretics with nephrology today, they recommend HCTZ over furosemide - Daily BMP - Avoid nephrotoxins - Strict ins and outs   Resistant hypertension Blood pressure improved Recognize ankle swelling was a problem with amlodipine earlier,  but given stroke, recommend strict blood pressure control, with most effective agents possible. - Continue Coreg, hydralazine, nifedipine - Add HCTZ   Hyperlipidemia - Continue Crestor    Acute metabolic encephalopathy Resolved  Type 2 diabetes mellitus with stage 3a chronic kidney disease, with long-term current use of insulin (HCC) Sugars good - Continue Semglee, aspart corrections  Hypokalemia Resolved          Subjective: Patient is feeling well, she has no dyspnea, orthopnea, confusion, respiratory symptoms, fever     Physical Exam: BP (!) 152/68 (BP Location: Right Arm)   Pulse 66   Temp 97.9 F (36.6 C) (Oral)   Resp 20   Ht 5' (1.524 m)   Wt 55.4 kg   SpO2 100%   BMI 23.85 kg/m   Thin elderly female, sitting on the edge of the bed, interactive and appropriate RRR, systolic murmur noted, no peripheral edema Respiratory rate normal, lungs clear without rales or wheezes Abdomen soft without tenderness to palpation or guarding Unchanged dysarthria, generalized weakness but nothing focal, oriented to self, staff, hospital   Data Reviewed: Creatinine improved to 2.4  Family Communication: Called to husband    Disposition: Status is: Inpatient Likely home with home health tomorrow        Author: Alberteen Sam, MD 05/27/2023 2:24 PM  For on call review [email or web address].

## 2023-05-27 NOTE — Plan of Care (Signed)
  Problem: Coping: Goal: Will verbalize positive feelings about self Outcome: Progressing   Problem: Nutrition: Goal: Risk of aspiration will decrease Outcome: Progressing   Problem: Coping: Goal: Ability to adjust to condition or change in health will improve Outcome: Progressing   Problem: Nutritional: Goal: Maintenance of adequate nutrition will improve Outcome: Progressing Goal: Progress toward achieving an optimal weight will improve Outcome: Progressing

## 2023-05-28 ENCOUNTER — Other Ambulatory Visit (HOSPITAL_COMMUNITY): Payer: Self-pay

## 2023-05-28 DIAGNOSIS — I614 Nontraumatic intracerebral hemorrhage in cerebellum: Secondary | ICD-10-CM | POA: Diagnosis not present

## 2023-05-28 LAB — GLUCOSE, CAPILLARY
Glucose-Capillary: 161 mg/dL — ABNORMAL HIGH (ref 70–99)
Glucose-Capillary: 64 mg/dL — ABNORMAL LOW (ref 70–99)
Glucose-Capillary: 190 mg/dL — ABNORMAL HIGH (ref 70–99)
Glucose-Capillary: 251 mg/dL — ABNORMAL HIGH (ref 70–99)

## 2023-05-28 LAB — BASIC METABOLIC PANEL
Anion gap: 8 (ref 5–15)
BUN: 48 mg/dL — ABNORMAL HIGH (ref 8–23)
CO2: 26 mmol/L (ref 22–32)
Calcium: 8.3 mg/dL — ABNORMAL LOW (ref 8.9–10.3)
Chloride: 101 mmol/L (ref 98–111)
Creatinine, Ser: 2.12 mg/dL — ABNORMAL HIGH (ref 0.44–1.00)
GFR, Estimated: 22 mL/min — ABNORMAL LOW (ref >60)
Glucose, Bld: 80 mg/dL (ref 70–99)
Potassium: 3.4 mmol/L — ABNORMAL LOW (ref 3.5–5.1)
Sodium: 135 mmol/L (ref 135–145)

## 2023-05-28 MED ORDER — HYDRALAZINE HCL 100 MG PO TABS
100.0000 mg | ORAL_TABLET | Freq: Three times a day (TID) | ORAL | 0 refills | Status: DC
  Filled 2023-05-28: qty 270, 90d supply, fill #0

## 2023-05-28 MED ORDER — NIFEDIPINE ER 60 MG PO TB24
60.0000 mg | ORAL_TABLET | Freq: Every day | ORAL | 0 refills | Status: DC
  Filled 2023-05-28: qty 90, 90d supply, fill #0

## 2023-05-28 MED ORDER — HYDROCHLOROTHIAZIDE 12.5 MG PO TABS
12.5000 mg | ORAL_TABLET | Freq: Once | ORAL | Status: AC
  Administered 2023-05-28: 12.5 mg via ORAL
  Filled 2023-05-28: qty 1

## 2023-05-28 MED ORDER — HYDROCHLOROTHIAZIDE 12.5 MG PO TABS
12.5000 mg | ORAL_TABLET | Freq: Every day | ORAL | 0 refills | Status: DC
  Filled 2023-05-28: qty 90, 90d supply, fill #0

## 2023-05-28 MED ORDER — CARVEDILOL 25 MG PO TABS
25.0000 mg | ORAL_TABLET | Freq: Two times a day (BID) | ORAL | 0 refills | Status: DC
  Filled 2023-05-28: qty 180, 90d supply, fill #0

## 2023-05-28 NOTE — TOC Transition Note (Signed)
Transition of Care [facility]) - CM/SW Discharge Note   Patient Details  Name: Emily Jennings MRN: [id number] Date of Birth: 1934-08-02  Transition of Care [facility]) CM/SW Contact:  Kermit Balo, RN Phone Number: 05/28/2023, 9:42 AM   Clinical Narrative:    Pt is discharging home with home health services through [facility]. Information on the AVS. Frances Furbish will contact her for the first home visit. Walker ordered through [employer] and will be delivered to the room.  Pt has transportation home.   Final next level of care: Home w Home Health Services Barriers to Discharge: No Barriers Identified   Patient Goals and CMS Choice CMS Medicare.gov Compare Post Acute Care list provided to:: Patient Choice offered to / list presented to : Patient, Spouse  Discharge Placement                         Discharge Plan and Services Additional resources added to the After Visit Summary for     Discharge Planning Services: CM Consult            DME Arranged: [facility] DME Agency: [employer]       HH Arranged: PT, OT, Speech Therapy HH Agency: [facility] Date [facility] Agency Contacted: 05/28/23   Representative spoke with at [facility] Agency: cory  Social Determinants of Health (SDOH) Interventions SDOH Screenings   Food Insecurity: No Food Insecurity (05/22/2023)  Housing: Low Risk  (05/22/2023)  Transportation Needs: No Transportation Needs (05/22/2023)  Utilities: Not At Risk (05/22/2023)  Alcohol Screen: Low Risk  (11/15/2022)  Depression (PHQ2-9): Low Risk  (05/16/2023)  Financial Resource Strain: Low Risk  (12/28/2022)  Physical Activity: Sufficiently Active (11/15/2022)  Social Connections: Moderately Integrated (11/15/2022)  Stress: No Stress Concern Present (12/28/2022)  Tobacco Use: Low Risk  (05/16/2023)     Readmission Risk Interventions    02/19/2023    8:06 AM  Readmission Risk Prevention Plan  Transportation Screening Complete  HRI or Home Care Consult Complete  Social  Work Consult for Recovery Care Planning/Counseling Complete  Palliative Care Screening Not Applicable  Medication Review [employer]) Complete

## 2023-05-28 NOTE — Progress Notes (Signed)
Patient discharged home, per order. VSS. Respirations are even and unlabored. NAD. RA. PIV removed, tip intact. Patient denies CP or SOB.   Discharge education/instructions provided to patient by Helmut Muster, RN.    All personal belongings sent with patient. Patient's spouse transporting patient home via private vehicle.

## 2023-05-28 NOTE — Progress Notes (Signed)
Physical Therapy Treatment Patient Details Name: Emily Jennings MRN: [id number] DOB: [birth year] Today's Date: 05/28/2023   History of Present Illness Pt is a 89 y/o female presenting on 8/28 with dizziness. CT found with nontraumatic intracerebral hemorrhage R cerebellum.  PMH includes: anxiety, breast cancer, carotid stenosis, DM, HTN, osteoporosis, insomnia.    PT Comments  Pt seen for PT tx with pt agreeable. Pt with decreased safety awareness noted. Pt is able to increase ambulation distances with encouragement, ambulating 1 lap around nurses station with RW & supervision. PT provides cuing for gait pattern to progress to more normalized pattern but fair<>poor demo by pt. Educated pt on recommendation to use RW for safety with mobility & pt agreeable; pt anticipating d/c today.   If plan is discharge home, recommend the following: A little help with walking and/or transfers;A little help with bathing/dressing/bathroom;Assist for transportation;Direct supervision/assist for financial management;Direct supervision/assist for medications management;Assistance with cooking/housework;Help with stairs or ramp for entrance   Can travel by private vehicle        Equipment Recommendations  Rolling walker (2 wheels)    Recommendations for Other Services       Precautions / Restrictions Precautions Precautions: Fall Precaution Comments: BP goal < 160 per neurology Restrictions Weight Bearing Restrictions: No     Mobility  Bed Mobility               General bed mobility comments: not tested, pt received in recliner, left sitting EOB    Transfers   Equipment used: Rolling walker (2 wheels) Transfers: Sit to/from Stand Sit to Stand: Supervision           General transfer comment: STS from recliner but poor awareness of hand placement (stands with BUE on RW vs pushing to stand) & decreased safety awareness (stands prior to PT being ready, chair alarm going off)     Ambulation/Gait Ambulation/Gait assistance: Supervision Gait Distance (Feet): 100 Feet Assistive device: Rolling walker (2 wheels) Gait Pattern/deviations: Decreased step length - right, Decreased step length - left, Decreased dorsiflexion - right, Decreased dorsiflexion - left, Decreased stride length Gait velocity: decreased     General Gait Details: PT educates pt on need to ambulate within base of AD, increased dorsiflexion during swing phase & heel strike but fair<>poor return demo.   Stairs             Wheelchair Mobility     Tilt Bed    Modified Rankin (Stroke Patients Only)       Balance Overall balance assessment: Needs assistance Sitting-balance support: No upper extremity supported, Feet supported Sitting balance-Leahy Scale: Good     Standing balance support: During functional activity, Bilateral upper extremity supported, Reliant on assistive device for balance Standing balance-Leahy Scale: Fair                              Cognition Arousal: Alert Behavior During Therapy: WFL for tasks assessed/performed Overall Cognitive Status: No family/caregiver present to determine baseline cognitive functioning                         Following Commands: Follows one step commands consistently Safety/Judgement: Decreased awareness of safety, Decreased awareness of deficits              Exercises      General Comments        Pertinent Vitals/Pain Pain Assessment Pain Assessment: No/denies pain  Home Living                          Prior Function            PT Goals (current goals can now be found in the care plan section) Acute Rehab PT Goals Patient Stated Goal: to walk and to go home PT Goal Formulation: With patient Time For Goal Achievement: 05/31/23 Potential to Achieve Goals: Good Progress towards PT goals: Progressing toward goals    Frequency    Min 1X/week      PT Plan       Co-evaluation              AM-PAC PT "6 Clicks" Mobility   Outcome Measure  Help needed turning from your back to your side while in a flat bed without using bedrails?: None Help needed moving from lying on your back to sitting on the side of a flat bed without using bedrails?: A Little Help needed moving to and from a bed to a chair (including a wheelchair)?: A Little Help needed standing up from a chair using your arms (e.g., wheelchair or bedside chair)?: A Little Help needed to walk in hospital room?: A Little Help needed climbing 3-5 steps with a railing? : A Little 6 Click Score: 19    End of Session   Activity Tolerance: Patient tolerated treatment well Patient left: in bed;with family/visitor present;with nursing/sitter in room   PT Visit Diagnosis: Unsteadiness on feet (R26.81);Difficulty in walking, not elsewhere classified (R26.2)     Time: 6045-4098 PT Time Calculation (min) (ACUTE ONLY): 8 min  Charges:    $Gait Training: 8-22 mins PT General Charges $$ ACUTE PT VISIT: 1 Visit                     Aleda Grana, PT, DPT 05/28/23, 1:47 PM   Sandi Mariscal 05/28/2023, 1:46 PM

## 2023-05-28 NOTE — Progress Notes (Signed)
Pt discharged to home in stable condition. All discharge instructions reviewed with and given to pt's husbands. He gives complete verbal understanding of all of the above. Pt transported off unit via wheelchair with staff x 1 at chairside to private vehicle to transport to home.

## 2023-05-28 NOTE — Discharge Summary (Signed)
Physician Discharge Summary   Patient: Emily Jennings MRN: [id number] DOB: [birth year]  Admit date:     05/16/2023  Discharge date: 05/28/23  Discharge Physician: Alberteen Sam   PCP: [name], MD     Recommendations at discharge:  Follow up with PCP Dr. [name] in 1 week for stroke, HTN, AKI Follow up with Nephrology Dr. Wolfgang Phoenix in 1 week for AKI Dr. [name] and Dr. Wolfgang Phoenix: Please check BMP in 1 week Discharge HTN regimen:  Coreg 25 BID Hydralazine 100 TID Nifedipine ER 60 daily Hydrochlorothiazide 12.5 daily     Discharge Diagnoses: Principal Problem:   Right cerebellar intraparenchymal hemorrhage due to hypertension Active Problems:   AKI (acute kidney injury) (HCC)   Resistant hypertension   Hypokalemia   Type 2 diabetes mellitus with stage 3a chronic kidney disease, with long-term current use of insulin (HCC)   Acute metabolic encephalopathy   Acute on chronic diastolic (congestive) heart failure (HCC)   Chronic kidney disease (CKD) stage G3b/A2, moderately decreased glomerular filtration rate (GFR) between 30-44 mL/min/1.73 square meter and albuminuria creatinine ratio between 30-299 mg/g (HCC)   Cerebellar edema (HCC)   Hyperlipidemia      Hospital Course: Emily Jennings is an 89 y.o. F with poorly controlled HTN, CKD IIIb baseline 1.4, DM dCHF and HLD who presented with dizziness and RUE ataxia.  Found to have right cerebellar hemorrhage.  Admitted on Cleviprex     * Right cerebellar intraparenchymal hemorrhage due to hypertension  Cerebellar edema Admitted with 2 cm right cerebellar hemorrhage.  CT angiogram of the head and neck showed no LVO, no high-grade stenosis of carotids or intracranial arteries.  Repeat CT head stable.  She was treated with hypertonic saline and cerebellar edema improved.     Echocardiogram showed dilated LA, no cardiogenic source of embolism.  LDL 65.  Hemoglobin A1c 7.5%.   Continue Crestor.  Aspirin contraindicated  due to ICH.  Neurologically improving, PT evaluation ordered home health.     AKI (acute kidney injury) (HCC) Baseline renal function is CKD stage IIIb, creatinine 1.6   Here creatinine is worsened to the 2.9 range.  Suspect this was likely due to contrast-induced nephropathy and hemodynamics.  Urine output remained good.  In the last 2 days, we have started hydrochlorothiazide for edema and her creatinine has trended down to 2.1    Resistant hypertension Severe range hypertension at admission  Here, her Coreg, hydralazine were increased.  Spironolactone was held and hydrochlorothiazide was added.  CCB was restarted.  Recognize ankle swelling was a problem with amlodipine earlier, but given hypertensive hemorrhagic stroke, priority is on effective agents.     Acute on chronic diastolic (congestive) heart failure (HCC) Treated with diuretics and improved. - Follow up with Nephrology for diuretic adjustment       Hyperlipidemia On Crestor     Acute metabolic encephalopathy Resolved   Type 2 diabetes mellitus with stage 3a chronic kidney disease, with long-term current use of insulin (HCC) On long acting insulin   Hypokalemia Resolved               The [facility] Controlled Substances Registry was reviewed for this patient prior to discharge.  Consultants: Neurology, Nephrology   Disposition: Home health Diet recommendation:  Discharge Diet Orders (From admission, onward)     Start     Ordered   05/28/23 0000  Diet - low sodium heart healthy        05/28/23 1137  DISCHARGE MEDICATION: Allergies as of 05/28/2023       Reactions   Amlodipine Swelling   Benadryl [diphenhydramine Hcl] Hypertension   Citalopram Other (See Comments)   unknown   Farxiga [dapagliflozin] Other (See Comments)   DKA   Metformin And Related Diarrhea   Lost appetite and weight    Tramadol Other (See Comments)   Felt light headed and dizzy   Crestor  [rosuvastatin] Other (See Comments)   "Feet swelling", makes her feel weak. Pt takes 5mg  at home        Medication List     STOP taking these medications    furosemide 40 MG tablet Commonly known as: LASIX   spironolactone 50 MG tablet Commonly known as: ALDACTONE       TAKE these medications    blood glucose meter kit and supplies Kit One touch Ultra. Use three times daily as directed. (FOR ICD E11.65)   calcitRIOL 0.25 MCG capsule Commonly known as: ROCALTROL Take 0.25 mcg by mouth 3 (three) times a week.   carvedilol 25 MG tablet Commonly known as: COREG Take 1 tablet (25 mg total) by mouth 2 (two) times daily with a meal. What changed:  medication strength how much to take how to take this when to take this additional instructions   Dexcom G7 Sensor Misc 1 Device by Does not apply route as directed.   hydrALAZINE 100 MG tablet Commonly known as: APRESOLINE Take 1 tablet (100 mg total) by mouth 3 (three) times daily. What changed:  medication strength how much to take how to take this when to take this additional instructions   hydrochlorothiazide 12.5 MG tablet Commonly known as: HYDRODIURIL Take 1 tablet (12.5 mg total) by mouth daily.   insulin lispro 100 UNIT/ML KwikPen Commonly known as: HumaLOG KwikPen Inject 5 Units into the skin 3 (three) times daily. Max daily 30 units   Insulin Pen Needle 32G X 4 MM Misc 1 each by Does not apply route in the morning and at bedtime.   mupirocin ointment 2 % Commonly known as: BACTROBAN Apply 1 Application topically 2 (two) times daily.   NIFEdipine 60 MG 24 hr tablet Commonly known as: ADALAT CC Take 1 tablet (60 mg total) by mouth daily. Start taking on: May 29, 2023   OneTouch Delica Lancets 33G Misc 1 each by Does not apply route 3 (three) times daily. Use to check glucose three times daily   OneTouch Ultra test strip Generic drug: glucose blood USE TO TEST 4 TIMES A DAY    rosuvastatin 5 MG tablet Commonly known as: CRESTOR TAKE 1 TABLET (5 MG TOTAL) BY MOUTH DAILY.   Evaristo Bury FlexTouch 100 UNIT/ML FlexTouch Pen Generic drug: insulin degludec Inject 5 Units into the skin daily.        Follow-up Information     [facility], [facility] Follow up.   Specialty: Home Health Services Why: The home health agency will contact you for the first home visit Contact information: [address] [place] [address] [phone]         [facility]. Schedule an appointment as soon as possible for a visit in 1 month(s).   Specialty: Neurology Why: stroke clinic Contact information: [address] [place] [address] [phone]        Randa Lynn, MD. Schedule an appointment as soon as possible for a visit in 2 week(s).   Specialty: Nephrology Contact information: [address]  [place] [address] [phone]         [name], MD. Schedule an appointment as soon as possible for a visit in 1 week(s).   Specialty: Family Medicine Contact information: [address] [place] [address] [phone]                 Discharge Instructions     Ambulatory referral to Neurology   Complete by: As directed    Follow up with stroke clinic NP at [facility] in about 4 weeks. Thanks.   Diet - low sodium heart healthy   Complete by: As directed    Discharge instructions   Complete by: As directed    **IMPORTANT DISCHARGE INSTRUCTIONS**   From Dr. Maryfrances Bunnell: You were admitted for a stroke Here you were evaluated by the Neurologist This was a hemorrhagic stroke, meaning it was due to bleeding, we suspect due to excessively high blood pressure. This underscores how important it is to manage blood pressure.  In addition to your CT head, you had an echocardiogram (ultrasound of the heart) that showed normal heart for your age.    Your  cholesterol is well controled on your rosuvastatin  You should continue your Degludec Tresiba insulin to maintain good blood sugar  Go see the Neurologist in 8 weeks (see below, in the To Do section under [facility]) we have sent a referral there.  Call them if you haven't heard from them about your appointment time.  You had a small worsening of your kidney function while you were here, although this is getting better. Important: For your blood pressure and kidney function -- go see Dr. Wolfgang Phoenix as soon as you are able  Have your kidney function checked in 1 week  For now, for blood pressure, take: Carvedilol 25 mg twice daily (INCREASE in dose)  Hydralazine 100 mg three times daily (INCREASE in dose)  REPLACE spironolactone and furosemide with hydrochlorothiazide 12.5 mg daily  ADD nifedipine ER 60 mg once daily  Go see Dr. Wolfgang Phoenix   Increase activity slowly   Complete by: As directed        Discharge Exam: Filed Weights   05/16/23 2135 05/22/23 1740  Weight: 55.4 kg 55.4 kg    General: Pt is alert, awake, not in acute distress Cardiovascular: RRR, nl S1-S2, no murmurs appreciated.   1+ LE edema.   Respiratory: Normal respiratory rate and rhythm.  CTAB without rales or wheezes. Abdominal: Abdomen soft and non-tender.  No distension or HSM.   Neuro/Psych: Strength symmetric in upper and lower extremities.  Judgment and insight appear impaired mildly but stable.  Speech dysarthric since stroke.   Condition at discharge: fair  The results of significant diagnostics from this hospitalization (including imaging, microbiology, ancillary and laboratory) are listed below for reference.   Imaging Studies: DG CHEST PORT 1 VIEW  Result Date: 05/26/2023 CLINICAL DATA:  Shortness of breath EXAM: PORTABLE CHEST 1 VIEW COMPARISON:  Chest x-ray 05/18/2023 FINDINGS: Heart is enlarged. There are atherosclerotic calcifications of the aorta. There is no focal lung  infiltrate, pleural effusion or pneumothorax. No acute fractures are seen. IMPRESSION: Cardiomegaly. No acute pulmonary process. Electronically Signed   By: Darliss Cheney M.D.   On: 05/26/2023 02:42   US RENAL  Result Date: 05/20/2023 CLINICAL DATA:  Acute renal insufficiency EXAM: RENAL / URINARY TRACT ULTRASOUND COMPLETE COMPARISON:  02/18/2023 FINDINGS: Right Kidney: Renal measurements: 7.1 x 3.2 x 4.2 cm = volume: 49.9 mL.  Increased renal cortical echotexture and renal atrophy consistent with medical renal disease. No hydronephrosis. Subcentimeter simple cyst does not require imaging follow-up. Left Kidney: Renal measurements: 10.6 x 3.7 x 5.0 cm = volume: 102.9 mL. Increased renal cortical echotexture consistent with medical renal disease. Simple left renal cysts again seen, largest in the upper pole measuring 2.8 cm. No specific imaging follow-up is required. 11 mm calcification upper pole left kidney. No hydronephrosis. Bladder: Bladder is minimally distended, limiting its evaluation. No gross abnormality. Other: Incidental small left pleural effusion. IMPRESSION: 1. Bilateral increased renal cortical echotexture consistent with medical renal disease. 2. Nonobstructing 11 mm left renal calculus.  No hydronephrosis. 3. Simple bilateral renal cortical cysts do not require imaging follow-up. Electronically Signed   By: Sharlet Salina M.D.   On: 05/20/2023 19:53   CT HEAD WO CONTRAST ( )  Result Date: 05/20/2023 CLINICAL DATA:  89 year old female with right cerebellar hemorrhage. EXAM: CT HEAD WITHOUT CONTRAST TECHNIQUE: Contiguous axial images were obtained from the base of the skull through the vertex without intravenous contrast. RADIATION DOSE REDUCTION: This exam was performed according to the departmental dose-optimization program which includes automated exposure control, adjustment of the mA and/or kV according to patient size and/or use of iterative reconstruction technique. COMPARISON:  Head CTs  05/17/2023 and earlier. FINDINGS: Brain: Hyperdense central right cerebellar hemorrhage now is 25 x 23 x 11 mm, stable since 05/17/2023. Regional edema remain stable. Partially effaced but patent 4th ventricle. And basilar cisterns remain patent. Stable gray-white differentiation elsewhere. No supratentorial midline shift or ventriculomegaly. No new intracranial hemorrhage. Vascular: Calcified atherosclerosis at the skull base. No suspicious intracranial vascular hyperdensity. Skull: Stable and intact. Sinuses/Orbits: Chronic right sphenoid sinusitis with mucoperiosteal thickening. Other Visualized paranasal sinuses and mastoids are stable and well aerated. Other: No acute orbit or scalp soft tissue finding. IMPRESSION: 1. Right cerebellar hemorrhage unchanged since 05/17/2023. Stable edema with no significant intracranial mass effect or complicating features. 2. No new intracranial abnormality. Electronically Signed   By: Odessa Fleming M.D.   On: 05/20/2023 12:55   DG CHEST PORT 1 VIEW  Result Date: 05/18/2023 CLINICAL DATA:  Shortness of breath EXAM: PORTABLE CHEST 1 VIEW COMPARISON:  05/17/2023 FINDINGS: Mild to moderate cardiomegaly. Atherosclerotic calcification of the aortic arch. Stable bilateral interstitial accentuation favoring interstitial edema, less likely atypical infectious process. No blunting of the costophrenic angles. Mild lower thoracic spondylosis. IMPRESSION: 1. Bilateral interstitial accentuation favoring interstitial edema, less likely atypical infectious process. 2. Mild to moderate cardiomegaly. 3.  Aortic Atherosclerosis (ICD10-I70.0). Electronically Signed   By: Gaylyn Rong M.D.   On: 05/18/2023 16:45   CT HEAD WO CONTRAST ( )  Result Date: 05/17/2023 CLINICAL DATA:  Acute neurologic deficit EXAM: CT HEAD WITHOUT CONTRAST TECHNIQUE: Contiguous axial images were obtained from the base of the skull through the vertex without intravenous contrast. RADIATION DOSE REDUCTION: This  exam was performed according to the departmental dose-optimization program which includes automated exposure control, adjustment of the mA and/or kV according to patient size and/or use of iterative reconstruction technique. COMPARISON:  05/17/2023 FINDINGS: Brain: Unchanged appearance of intraparenchymal hematoma in the right cerebellum. Generalized volume loss. There is periventricular hypoattenuation compatible with chronic microvascular disease. Vascular: Atherosclerotic calcification of the vertebral and internal carotid arteries at the skull base. No abnormal hyperdensity of the major intracranial arteries or dural venous sinuses. Skull: The visualized skull base, calvarium and extracranial soft tissues are normal. Sinuses/Orbits: Right sphenoid sinus opacification. The orbits are normal. IMPRESSION: Unchanged appearance of intraparenchymal  hematoma in the right cerebellum. Electronically Signed   By: Deatra Robinson M.D.   On: 05/17/2023 23:32   MR BRAIN WO CONTRAST  Result Date: 05/17/2023 CLINICAL DATA:  Stroke, hemorrhagic EXAM: MRI HEAD WITHOUT CONTRAST TECHNIQUE: Multiplanar, multiecho pulse sequences of the brain and surrounding structures were obtained without intravenous contrast. COMPARISON:  Same day CT head FINDINGS: Brain: Redemonstrated right cerebellar hematoma, which is grossly unchanged in size measuring up to 2.1 cm. Negative for an acute infarct. There is vasogenic edema surrounding the cerebellar hemorrhage with minimal mass effect on the fourth ventricle. No evidence of hydrocephalus. No extra-axial fluid collection. There is sequela of mild overall chronic microvascular ischemic change. Lack of IV contrast limits the ability to assess for an underlying lesion. Generalized volume loss. Vascular: Normal flow voids. Skull and upper cervical spine: Normal marrow signal. Sinuses/Orbits: No middle ear or mastoid effusion. Paranasal sinuses are clear. Orbits are unremarkable Other: None.  IMPRESSION: Redemonstrated right cerebellar hematoma, which is grossly unchanged in size measuring up to 2.1 cm when compared to same day head CT. There is vasogenic edema surrounding the cerebellar hemorrhage with minimal mass effect on the fourth ventricle. No evidence of hydrocephalus. Electronically Signed   By: Lorenza Cambridge M.D.   On: 05/17/2023 19:46   DG CHEST PORT 1 VIEW  Result Date: 05/17/2023 CLINICAL DATA:  Respiratory abnormalities, intracranial hemorrhage EXAM: PORTABLE CHEST 1 VIEW COMPARISON:  05/16/2023 FINDINGS: Single frontal view of the chest demonstrates stable enlargement of the cardiac silhouette. Chronic interstitial prominence compatible with scarring. No acute airspace disease, effusion, or pneumothorax. No acute bony abnormalities. IMPRESSION: 1. Stable chest, no acute process. Electronically Signed   By: Sharlet Salina M.D.   On: 05/17/2023 19:31   ECHOCARDIOGRAM COMPLETE  Result Date: 05/17/2023    ECHOCARDIOGRAM REPORT   Patient Name:   DESRA ELICH Date of Exam: 05/17/2023 Medical Rec #:  [id number]     Height:       60.0 in Accession #:    [id number]    Weight:       122.1 lb Date of Birth:  06-Jun-1934      BSA:          1.514 m Patient Age:    89 years      BP:           134/67 mmHg Patient Gender: F             HR:           78 bpm. Exam Location:  Inpatient Procedure: 2D Echo, Color Doppler and Cardiac Doppler Indications:    Stroke i63.9 (hemorrhagic)  History:        Patient has prior history of Echocardiogram examinations, most                 recent 09/13/2022. CHF; Risk Factors:Hypertension, Diabetes and                 Dyslipidemia.  Sonographer:    Irving Burton Senior RDCS Referring Phys: [phone] MCNEILL P KIRKPATRICK IMPRESSIONS  1. Left ventricular ejection fraction, by estimation, is 65 to 70%. The left ventricle has normal function. The left ventricle has no regional wall motion abnormalities. There is moderate concentric left ventricular hypertrophy. Left ventricular  diastolic parameters are consistent with Grade II diastolic dysfunction (pseudonormalization).  2. Right ventricular systolic function is normal. The right ventricular size is normal. There is mildly elevated pulmonary artery systolic pressure. The estimated right ventricular systolic pressure  is 38.3 mmHg.  3. Left atrial size was severely dilated.  4. Right atrial size was mildly dilated.  5. A small pericardial effusion is present. The pericardial effusion is anterior to the right ventricle.  6. Chordal systolic anterior motion of the mitral valve. . The mitral valve is grossly normal. Mild mitral valve regurgitation. No evidence of mitral stenosis.  7. The aortic valve is abnormal. There is moderate calcification of the aortic valve. Aortic valve regurgitation is trivial. Mild to moderate aortic valve stenosis. Aortic valve area, by VTI measures 1.33 cm. Aortic valve mean gradient measures 18.5 mmHg. Aortic valve Vmax measures 2.88 m/s.  8. The inferior vena cava is normal in size with greater than 50% respiratory variability, suggesting right atrial pressure of 3 mmHg. FINDINGS  Left Ventricle: Left ventricular ejection fraction, by estimation, is 65 to 70%. The left ventricle has normal function. The left ventricle has no regional wall motion abnormalities. The left ventricular internal cavity size was normal in size. There is  moderate concentric left ventricular hypertrophy. Left ventricular diastolic parameters are consistent with Grade II diastolic dysfunction (pseudonormalization). Right Ventricle: The right ventricular size is normal. No increase in right ventricular wall thickness. Right ventricular systolic function is normal. There is mildly elevated pulmonary artery systolic pressure. The tricuspid regurgitant velocity is 2.97  m/s, and with an assumed right atrial pressure of 3 mmHg, the estimated right ventricular systolic pressure is 38.3 mmHg. Left Atrium: Left atrial size was severely dilated.  Right Atrium: Right atrial size was mildly dilated. Pericardium: A small pericardial effusion is present. The pericardial effusion is anterior to the right ventricle. Mitral Valve: Chordal systolic anterior motion of the mitral valve. The mitral valve is grossly normal. Mild mitral valve regurgitation. No evidence of mitral valve stenosis. MV peak gradient, 9.0 mmHg. The mean mitral valve gradient is 4.0 mmHg. Tricuspid Valve: The tricuspid valve is normal in structure. Tricuspid valve regurgitation is mild . No evidence of tricuspid stenosis. Aortic Valve: The aortic valve is abnormal. There is moderate calcification of the aortic valve. Aortic valve regurgitation is trivial. Mild to moderate aortic stenosis is present. Aortic valve mean gradient measures 18.5 mmHg. Aortic valve peak gradient  measures 33.2 mmHg. Aortic valve area, by VTI measures 1.33 cm. Pulmonic Valve: The pulmonic valve was normal in structure. Pulmonic valve regurgitation is trivial. No evidence of pulmonic stenosis. Aorta: The aortic root is normal in size and structure. Venous: The inferior vena cava is normal in size with greater than 50% respiratory variability, suggesting right atrial pressure of 3 mmHg. IAS/Shunts: No atrial level shunt detected by color flow Doppler.  LEFT VENTRICLE PLAX 2D LVIDd:         3.90 cm   Diastology LVIDs:         1.90 cm   LV e' medial:    6.09 cm/s LV PW:         1.50 cm   LV E/e' medial:  22.3 LV IVS:        1.30 cm   LV e' lateral:   6.85 cm/s LVOT diam:     1.80 cm   LV E/e' lateral: 19.9 LV SV:         81 LV SV Index:   53 LVOT Area:     2.54 cm  RIGHT VENTRICLE RV S prime:     11.50 cm/s TAPSE (M-mode): 2.0 cm LEFT ATRIUM              Index  RIGHT ATRIUM           Index LA diam:        3.90 cm  2.58 cm/m   RA Area:     18.10 cm LA Vol (A2C):   104.0 ml 68.71 ml/m  RA Volume:   44.60 ml  29.47 ml/m LA Vol (A4C):   96.8 ml  63.95 ml/m LA Biplane Vol: 102.0 ml 67.39 ml/m  AORTIC VALVE AV Area  (Vmax):    1.33 cm AV Area (Vmean):   1.34 cm AV Area (VTI):     1.33 cm AV Vmax:           288.00 cm/s AV Vmean:          203.000 cm/s AV VTI:            0.610 m AV Peak Grad:      33.2 mmHg AV Mean Grad:      18.5 mmHg LVOT Vmax:         151.00 cm/s LVOT Vmean:        107.000 cm/s LVOT VTI:          0.318 m LVOT/AV VTI ratio: 0.52  AORTA Ao Root diam: 2.70 cm Ao Asc diam:  3.00 cm MITRAL VALVE                TRICUSPID VALVE MV Area (PHT): 3.23 cm     TR Peak grad:   35.3 mmHg MV Area VTI:   1.77 cm     TR Vmax:        297.00 cm/s MV Peak grad:  9.0 mmHg MV Mean grad:  4.0 mmHg     SHUNTS MV Vmax:       1.50 m/s     Systemic VTI:  0.32 m MV Vmean:      93.4 cm/s    Systemic Diam: 1.80 cm MV Decel Time: 235 msec MV E velocity: 136.00 cm/s MV A velocity: 117.00 cm/s MV E/A ratio:  1.16 Weston Brass MD Electronically signed by Weston Brass MD Signature Date/Time: 05/17/2023/4:52:48 PM    Final    CT HEAD WO CONTRAST ( )  Result Date: 05/17/2023 CLINICAL DATA:  89 year old female with right cerebellar hemorrhage and edema. EXAM: CT HEAD WITHOUT CONTRAST TECHNIQUE: Contiguous axial images were obtained from the base of the skull through the vertex without intravenous contrast. RADIATION DOSE REDUCTION: This exam was performed according to the departmental dose-optimization program which includes automated exposure control, adjustment of the mA and/or kV according to patient size and/or use of iterative reconstruction technique. COMPARISON:  Head CT, CTA head and neck yesterday. Brain MRI 04/25/2023. FINDINGS: Brain: Hyperdense hemorrhage in the right cerebellum is rounded, 24 by 23 x 13 mm (AP by transverse by CC) for an estimated blood volume of 3-4 mL (versus 3 mL yesterday when measured with the same technique). Surrounding edema. Fourth ventricle and basilar cisterns remain patent. No IVH or extra-axial extension of blood is identified. Lateral and 3rd ventricle size appears stable from previous  years. There is chronic nonspecific white matter hypodensity but no transependymal edema suspected. No midline shift. No new intracranial hemorrhage. No new cortically based infarct. Vascular: Extensive Calcified atherosclerosis at the skull base. Skull: Intact.  No acute osseous abnormality identified. Sinuses/Orbits: Chronic right sphenoid sinusitis with mucoperiosteal thickening. Stable sinus and mastoid aeration. Other: No acute orbit or scalp soft tissue finding. IMPRESSION: 1. Right cerebellar hemorrhage is stable to perhaps 1 mL larger since yesterday (3-4 mL  overall). Surrounding edema, but no significant posterior fossa mass effect, and no other complicating features. 2. No new intracranial abnormality. Electronically Signed   By: Odessa Fleming M.D.   On: 05/17/2023 06:09   CT ANGIO HEAD NECK W WO CM  Result Date: 05/16/2023 CLINICAL DATA:  Intracranial hemorrhage EXAM: CT ANGIOGRAPHY HEAD AND NECK WITH AND WITHOUT CONTRAST TECHNIQUE: Multidetector CT imaging of the head and neck was performed using the standard protocol during bolus administration of intravenous contrast. Multiplanar CT image reconstructions and MIPs were obtained to evaluate the vascular anatomy. Carotid stenosis measurements (when applicable) are obtained utilizing NASCET criteria, using the distal internal carotid diameter as the denominator. RADIATION DOSE REDUCTION: This exam was performed according to the departmental dose-optimization program which includes automated exposure control, adjustment of the mA and/or kV according to patient size and/or use of iterative reconstruction technique. CONTRAST:  50mL OMNIPAQUE IOHEXOL 350 MG/ML SOLN COMPARISON:  None Available. FINDINGS: CT HEAD FINDINGS Brain: In intraparenchymal hematoma within the right cerebellum measuring 2.1 x 2.0 x 1.0 cm (2 mL). Mild surrounding edema. No mass effect. The size and configuration of the ventricles and extra-axial CSF spaces are normal. There is  hypoattenuation of the periventricular white matter, most commonly indicating chronic ischemic microangiopathy. Skull: The visualized skull base, calvarium and extracranial soft tissues are normal. Sinuses/Orbits: No fluid levels or advanced mucosal thickening of the visualized paranasal sinuses. No mastoid or middle ear effusion. The orbits are normal. CTA NECK FINDINGS SKELETON: There is no bony spinal canal stenosis. No lytic or blastic lesion. OTHER NECK: Normal pharynx, larynx and major salivary glands. No cervical lymphadenopathy. 1.8 cm nodule in the left lobe of the thyroid gland. UPPER CHEST: No pneumothorax or pleural effusion. No nodules or masses. AORTIC ARCH: There is no calcific atherosclerosis of the aortic arch. Conventional 3 vessel aortic branching pattern. RIGHT CAROTID SYSTEM: Normal without aneurysm, dissection or stenosis. LEFT CAROTID SYSTEM: Normal without aneurysm, dissection or stenosis. VERTEBRAL ARTERIES: Left dominant configuration.There is no dissection, occlusion or flow-limiting stenosis to the skull base (V1-V3 segments). CTA HEAD FINDINGS POSTERIOR CIRCULATION: --Vertebral arteries: Normal V4 segments. --Inferior cerebellar arteries: Normal. --Basilar artery: Normal. --Superior cerebellar arteries: Normal. --Posterior cerebral arteries (PCA): Normal. ANTERIOR CIRCULATION: --Intracranial internal carotid arteries: Normal. --Anterior cerebral arteries (ACA): Normal. Both A1 segments are present. Patent anterior communicating artery (a-comm). --Middle cerebral arteries (MCA): Normal. VENOUS SINUSES: As permitted by contrast timing, patent. ANATOMIC VARIANTS: None Review of the MIP images confirms the above findings. IMPRESSION: 1. Intraparenchymal hematoma within the right cerebellum measuring 2.1 x 2.0 x 1.0 cm with mild surrounding edema. No mass effect. 2. No emergent large vessel occlusion or high-grade stenosis of the intracranial arteries. 3. No aneurysm or vascular malformation.  4. Incidental left thyroid nodule measuring 1.8 cm. Recommend non-emergent thyroid ultrasound if clinically warranted given patient age. Reference: J Am Coll Radiol. 2015 Feb;12(2): 143-50 Critical Value/emergent results were called by telephone at the time of interpretation on 05/16/2023 at 7:43 pm to provider Benjiman Core , who verbally acknowledged these results. Electronically Signed   By: Deatra Robinson M.D.   On: 05/16/2023 19:43   DG Chest Portable 1 View  Result Date: 05/16/2023 CLINICAL DATA:  Weakness EXAM: PORTABLE CHEST 1 VIEW COMPARISON:  X-ray 09/16/2022 FINDINGS: Mildly enlarged cardiopericardial silhouette with a calcified aorta. Chronic lung changes with some interstitial components. No consolidation, pneumothorax or edema. Overlapping cardiac leads. Osteopenia and degenerative change. IMPRESSION: Enlarged cardiopericardial silhouette. Chronic lung changes. Hyperinflation. Electronically Signed  By: Karen Kays M.D.   On: 05/16/2023 16:54    Microbiology: Results for orders placed or performed during the hospital encounter of 05/16/23  MRSA Next Gen by PCR, Nasal     Status: None   Collection Time: 05/16/23  9:37 PM   Specimen: Nasal Mucosa; Nasal Swab  Result Value Ref Range Status   MRSA by PCR Next Gen NOT DETECTED NOT DETECTED Final    Comment: (NOTE) The GeneXpert MRSA Assay (FDA approved for NASAL specimens only), is one component of a comprehensive MRSA colonization surveillance program. It is not intended to diagnose MRSA infection nor to guide or monitor treatment for MRSA infections. Test performance is not FDA approved in patients less than 35 years old. Performed at [facility], [address] [place], [place] [address]    *Note: Due to a large number of results and/or encounters for the requested time period, some results have not been displayed. A complete set of results can be found in Results Review.    Labs: CBC: Recent Labs  Lab  05/22/23 0736 05/23/23 0718 05/24/23 0922 05/25/23 0545  WBC 6.1 5.5 5.7 6.1  HGB 10.0* 9.9* 10.2* 9.4*  HCT 31.1* 30.7* 32.3* 29.0*  MCV 97.2 95.9 96.4 97.0  PLT 143* 145* 156 160   Basic Metabolic Panel: Recent Labs  Lab 05/24/23 0922 05/25/23 0545 05/26/23 1127 05/27/23 1028 05/28/23 0722  NA 135 134* 130* 130* 135  K 4.0 3.6 3.8 4.2 3.4*  CL 102 101 97* 96* 101  CO2 20* 26 25 26 26   GLUCOSE 427* 147* 296* 291* 80  BUN 57* 51* 57* 53* 48*  CREATININE 2.74* 2.30* 2.57* 2.41* 2.12*  CALCIUM 8.1* 8.0* 8.0* 8.0* 8.3*   Liver Function Tests: No results for input(s): "AST", "ALT", "ALKPHOS", "BILITOT", "PROT", "ALBUMIN" in the last 168 hours. CBG: Recent Labs  Lab 05/27/23 1626 05/27/23 2110 05/28/23 0614 05/28/23 0758 05/28/23 1117  GLUCAP 161* 101* 64* 190* 251*    Discharge time spent: approximately 35 minutes spent on discharge counseling, evaluation of patient on day of discharge, and coordination of discharge planning with nursing, social work, pharmacy and case management  Signed: Alberteen Sam, MD [facility] 05/28/2023

## 2023-05-30 ENCOUNTER — Ambulatory Visit: Payer: Self-pay | Admitting: *Deleted

## 2023-05-30 ENCOUNTER — Telehealth: Payer: Self-pay

## 2023-05-30 ENCOUNTER — Telehealth: Payer: Self-pay | Admitting: Family Medicine

## 2023-05-30 NOTE — Transitions of Care (Post Inpatient/ED Visit) (Signed)
   05/30/2023  Name: TONDRA FILECCIA MRN: [id number] DOB: 1934-01-27  Today's TOC FU Call Status: Today's TOC FU Call Status:: Unsuccessful Call (1st Attempt) Unsuccessful Call (1st Attempt) Date: 05/30/23  Attempted to reach the patient regarding the most recent Inpatient/ED visit.  Follow Up Plan: Additional outreach attempts will be made to reach the patient to complete the Transitions of Care (Post Inpatient/ED visit) call.   Jodelle Gross RN, BSN, CCM [facility] RN Care Coordinator/ Transitions of Care Direct Dial: [phone]  Fax: [phone]

## 2023-05-30 NOTE — Patient Outreach (Signed)
  Care Coordination   05/30/2023 Name: Emily Jennings MRN: [id number] DOB: 05/21/1934   Care Coordination Outreach Attempts:  An unsuccessful telephone outreach was attempted for a scheduled appointment today.  Follow Up Plan:  Additional outreach attempts will be made to offer the patient care coordination information and services.   Encounter Outcome:  No Answer   Care Coordination Interventions:  No, not indicated    Blanchie Zeleznik L. Noelle Penner, RN, BSN, CCM, Care Management Coordinator [phone]

## 2023-05-30 NOTE — Telephone Encounter (Signed)
Pt TOC appt scheduled with Patel on 9/26 Pt DC 05/16/2023 - 05/28/2023 (12 days) [facility]   No available appts with PCP or within TOC timeframe

## 2023-05-31 ENCOUNTER — Telehealth: Payer: Self-pay

## 2023-05-31 NOTE — Telephone Encounter (Signed)
HFU scheduled with Simpson on 9/18 at 2:00pm. I have left several messages on her VM with the new date and time but if she calls back please make her aware

## 2023-05-31 NOTE — Transitions of Care (Post Inpatient/ED Visit) (Signed)
   05/31/2023  Name: Emily Jennings MRN: [id number] DOB: June 01, 1934  Today's TOC FU Call Status: Today's TOC FU Call Status:: Unsuccessful Call (2nd Attempt) Unsuccessful Call (2nd Attempt) Date: 05/31/23  Attempted to reach the patient regarding the most recent Inpatient/ED visit.  Follow Up Plan: Additional outreach attempts will be made to reach the patient to complete the Transitions of Care (Post Inpatient/ED visit) call.   Jodelle Gross RN, BSN, CCM [facility] RN Care Coordinator/ Transitions of Care Direct Dial: [phone]  Fax: [phone]

## 2023-06-01 ENCOUNTER — Inpatient Hospital Stay (HOSPITAL_COMMUNITY)
Admission: EM | Admit: 2023-06-01 | Discharge: 2023-06-04 | DRG: 291 | Disposition: A | Discharge status: Home Health | Payer: Medicare Other | Attending: Family Medicine | Admitting: Family Medicine

## 2023-06-01 ENCOUNTER — Telehealth: Payer: Self-pay

## 2023-06-01 ENCOUNTER — Emergency Department (HOSPITAL_COMMUNITY): Payer: Medicare Other

## 2023-06-01 ENCOUNTER — Other Ambulatory Visit: Payer: Self-pay

## 2023-06-01 ENCOUNTER — Encounter (HOSPITAL_COMMUNITY): Payer: Self-pay

## 2023-06-01 DIAGNOSIS — Z794 Long term (current) use of insulin: Secondary | ICD-10-CM

## 2023-06-01 DIAGNOSIS — Z86000 Personal history of in-situ neoplasm of breast: Secondary | ICD-10-CM | POA: Diagnosis not present

## 2023-06-01 DIAGNOSIS — R1900 Intra-abdominal and pelvic swelling, mass and lump, unspecified site: Secondary | ICD-10-CM | POA: Diagnosis not present

## 2023-06-01 DIAGNOSIS — E1122 Type 2 diabetes mellitus with diabetic chronic kidney disease: Secondary | ICD-10-CM | POA: Diagnosis not present

## 2023-06-01 DIAGNOSIS — N184 Chronic kidney disease, stage 4 (severe): Secondary | ICD-10-CM | POA: Diagnosis present

## 2023-06-01 DIAGNOSIS — I5033 Acute on chronic diastolic (congestive) heart failure: Secondary | ICD-10-CM | POA: Diagnosis present

## 2023-06-01 DIAGNOSIS — I2510 Atherosclerotic heart disease of native coronary artery without angina pectoris: Secondary | ICD-10-CM | POA: Diagnosis not present

## 2023-06-01 DIAGNOSIS — M810 Age-related osteoporosis without current pathological fracture: Secondary | ICD-10-CM | POA: Diagnosis not present

## 2023-06-01 DIAGNOSIS — N189 Chronic kidney disease, unspecified: Secondary | ICD-10-CM | POA: Diagnosis present

## 2023-06-01 DIAGNOSIS — I5031 Acute diastolic (congestive) heart failure: Secondary | ICD-10-CM

## 2023-06-01 DIAGNOSIS — F411 Generalized anxiety disorder: Secondary | ICD-10-CM | POA: Diagnosis present

## 2023-06-01 DIAGNOSIS — E1165 Type 2 diabetes mellitus with hyperglycemia: Secondary | ICD-10-CM | POA: Diagnosis not present

## 2023-06-01 DIAGNOSIS — E785 Hyperlipidemia, unspecified: Secondary | ICD-10-CM | POA: Diagnosis present

## 2023-06-01 DIAGNOSIS — I509 Heart failure, unspecified: Secondary | ICD-10-CM | POA: Diagnosis not present

## 2023-06-01 DIAGNOSIS — I130 Hypertensive heart and chronic kidney disease with heart failure and stage 1 through stage 4 chronic kidney disease, or unspecified chronic kidney disease: Principal | ICD-10-CM | POA: Diagnosis present

## 2023-06-01 DIAGNOSIS — Z833 Family history of diabetes mellitus: Secondary | ICD-10-CM | POA: Diagnosis not present

## 2023-06-01 DIAGNOSIS — I5020 Unspecified systolic (congestive) heart failure: Principal | ICD-10-CM

## 2023-06-01 DIAGNOSIS — Z79899 Other long term (current) drug therapy: Secondary | ICD-10-CM | POA: Diagnosis not present

## 2023-06-01 DIAGNOSIS — K219 Gastro-esophageal reflux disease without esophagitis: Secondary | ICD-10-CM | POA: Diagnosis not present

## 2023-06-01 DIAGNOSIS — E782 Mixed hyperlipidemia: Secondary | ICD-10-CM | POA: Diagnosis present

## 2023-06-01 DIAGNOSIS — N179 Acute kidney failure, unspecified: Secondary | ICD-10-CM | POA: Diagnosis not present

## 2023-06-01 DIAGNOSIS — Z8249 Family history of ischemic heart disease and other diseases of the circulatory system: Secondary | ICD-10-CM | POA: Diagnosis not present

## 2023-06-01 DIAGNOSIS — J90 Pleural effusion, not elsewhere classified: Secondary | ICD-10-CM | POA: Diagnosis not present

## 2023-06-01 DIAGNOSIS — Z83438 Family history of other disorder of lipoprotein metabolism and other lipidemia: Secondary | ICD-10-CM

## 2023-06-01 DIAGNOSIS — M1990 Unspecified osteoarthritis, unspecified site: Secondary | ICD-10-CM | POA: Diagnosis present

## 2023-06-01 DIAGNOSIS — Z8673 Personal history of transient ischemic attack (TIA), and cerebral infarction without residual deficits: Secondary | ICD-10-CM | POA: Diagnosis not present

## 2023-06-01 DIAGNOSIS — Z823 Family history of stroke: Secondary | ICD-10-CM

## 2023-06-01 DIAGNOSIS — N1832 Chronic kidney disease, stage 3b: Secondary | ICD-10-CM | POA: Diagnosis present

## 2023-06-01 DIAGNOSIS — N1831 Chronic kidney disease, stage 3a: Secondary | ICD-10-CM

## 2023-06-01 DIAGNOSIS — E876 Hypokalemia: Secondary | ICD-10-CM | POA: Diagnosis not present

## 2023-06-01 DIAGNOSIS — Z888 Allergy status to other drugs, medicaments and biological substances status: Secondary | ICD-10-CM

## 2023-06-01 DIAGNOSIS — M7989 Other specified soft tissue disorders: Secondary | ICD-10-CM | POA: Diagnosis not present

## 2023-06-01 DIAGNOSIS — R0989 Other specified symptoms and signs involving the circulatory and respiratory systems: Secondary | ICD-10-CM | POA: Diagnosis not present

## 2023-06-01 DIAGNOSIS — I1A0 Resistant hypertension: Secondary | ICD-10-CM | POA: Diagnosis present

## 2023-06-01 DIAGNOSIS — I110 Hypertensive heart disease with heart failure: Secondary | ICD-10-CM | POA: Diagnosis not present

## 2023-06-01 DIAGNOSIS — Z23 Encounter for immunization: Secondary | ICD-10-CM

## 2023-06-01 DIAGNOSIS — E871 Hypo-osmolality and hyponatremia: Secondary | ICD-10-CM | POA: Diagnosis not present

## 2023-06-01 DIAGNOSIS — Z885 Allergy status to narcotic agent status: Secondary | ICD-10-CM

## 2023-06-01 LAB — CBC
HCT: 36.1 % (ref 36.0–46.0)
Hemoglobin: 11.6 g/dL — ABNORMAL LOW (ref 12.0–15.0)
MCH: 30.8 pg (ref 26.0–34.0)
MCHC: 32.1 g/dL (ref 30.0–36.0)
MCV: 95.8 fL (ref 80.0–100.0)
Platelets: 253 10*3/uL (ref 150–400)
RBC: 3.77 MIL/uL — ABNORMAL LOW (ref 3.87–5.11)
RDW: 13.8 % (ref 11.5–15.5)
WBC: 6.7 10*3/uL (ref 4.0–10.5)
nRBC: 0 % (ref 0.0–0.2)

## 2023-06-01 LAB — BASIC METABOLIC PANEL
Anion gap: 10 (ref 5–15)
BUN: 40 mg/dL — ABNORMAL HIGH (ref 8–23)
CO2: 26 mmol/L (ref 22–32)
Calcium: 8.5 mg/dL — ABNORMAL LOW (ref 8.9–10.3)
Chloride: 91 mmol/L — ABNORMAL LOW (ref 98–111)
Creatinine, Ser: 2.08 mg/dL — ABNORMAL HIGH (ref 0.44–1.00)
GFR, Estimated: 23 mL/min — ABNORMAL LOW (ref >60)
Glucose, Bld: 196 mg/dL — ABNORMAL HIGH (ref 70–99)
Potassium: 3.6 mmol/L (ref 3.5–5.1)
Sodium: 127 mmol/L — ABNORMAL LOW (ref 135–145)

## 2023-06-01 LAB — BRAIN NATRIURETIC PEPTIDE: B Natriuretic Peptide: 939 pg/mL — ABNORMAL HIGH (ref 0.0–100.0)

## 2023-06-01 LAB — MAGNESIUM: Magnesium: 2.3 mg/dL (ref 1.7–2.4)

## 2023-06-01 MED ORDER — FUROSEMIDE 10 MG/ML IJ SOLN
20.0000 mg | Freq: Once | INTRAMUSCULAR | Status: AC
  Administered 2023-06-01: 20 mg via INTRAVENOUS
  Filled 2023-06-01: qty 2

## 2023-06-01 NOTE — ED Triage Notes (Addendum)
C/o bilateral lower extremity swelling, right upper extremity, and abd swelling.  Denies cp/sob Hx chf.  Patient on hydrochlorothiazide and reports missing 4-5 doses.

## 2023-06-01 NOTE — ED Provider Notes (Signed)
[place] EMERGENCY DEPARTMENT AT [facility] Provider Note   CSN: [id number] Arrival date & time: 06/01/23  1808     History {Add pertinent medical, surgical, social history, OB history to HPI:1} Chief Complaint  Patient presents with   Leg Swelling    Emily Jennings is a 89 y.o. female.  Patient with a history of heart failure and a recent admission for cerebral hemorrhage..  She complains of some swelling in her legs and some shortness of breath.   Shortness of Breath      Home Medications Prior to Admission medications   Medication Sig Start Date End Date Taking? Authorizing Provider  blood glucose meter kit and supplies KIT One touch Ultra. Use three times daily as directed. (FOR ICD E11.65) 10/27/20   Roma Kayser, MD  calcitRIOL (ROCALTROL) 0.25 MCG capsule Take 0.25 mcg by mouth 3 (three) times a week.    [provider]  carvedilol (COREG) 25 MG tablet Take 1 tablet (25 mg total) by mouth 2 (two) times daily with a meal. 05/28/23   Danford, Earl Lites, MD  Continuous Blood Gluc Sensor (DEXCOM G7 SENSOR) MISC 1 Device by Does not apply route as directed. 12/25/22   Shamleffer, Konrad Dolores, MD  glucose blood (ONETOUCH ULTRA) test strip USE TO TEST 4 TIMES A DAY 01/09/23   Roma Kayser, MD  hydrALAZINE (APRESOLINE) 100 MG tablet Take 1 tablet (100 mg total) by mouth 3 (three) times daily. 05/28/23   Danford, Earl Lites, MD  hydrochlorothiazide (HYDRODIURIL) 12.5 MG tablet Take 1 tablet (12.5 mg total) by mouth daily. 05/28/23   Danford, Earl Lites, MD  insulin degludec (TRESIBA FLEXTOUCH) 100 UNIT/ML FlexTouch Pen Inject 5 Units into the skin daily. 05/01/23   Shamleffer, Konrad Dolores, MD  insulin lispro (HUMALOG KWIKPEN) 100 UNIT/ML KwikPen Inject 5 Units into the skin 3 (three) times daily. Max daily 30 units 05/01/23   Shamleffer, Konrad Dolores, MD  Insulin Pen Needle 32G X 4 MM MISC 1 each by Does not apply route in the morning  and at bedtime. 12/25/22   Shamleffer, Konrad Dolores, MD  mupirocin ointment (BACTROBAN) 2 % Apply 1 Application topically 2 (two) times daily. 05/01/23   [name], MD  NIFEdipine (ADALAT CC) 60 MG 24 hr tablet Take 1 tablet (60 mg total) by mouth daily. 05/29/23   Danford, Earl Lites, MD  OneTouch Delica Lancets 33G MISC 1 each by Does not apply route 3 (three) times daily. Use to check glucose three times daily 10/03/22   Roma Kayser, MD  rosuvastatin (CRESTOR) 5 MG tablet TAKE 1 TABLET (5 MG TOTAL) BY MOUTH DAILY. 01/15/23   [name], MD      Allergies    Amlodipine, Benadryl [diphenhydramine hcl], Citalopram, Farxiga [dapagliflozin], Metformin and related, Tramadol, and Crestor [rosuvastatin]    Review of Systems   Review of Systems  Respiratory:  Positive for shortness of breath.     Physical Exam Updated Vital Signs BP 139/60   Pulse 74   Temp 97.8 F (36.6 C) (Oral)   Resp 20   Wt 52.6 kg   SpO2 96%   BMI 22.65 kg/m  Physical Exam  ED Results / Procedures / Treatments   Labs (all labs ordered are listed, but only abnormal results are displayed) Labs Reviewed  BASIC METABOLIC PANEL - Abnormal; Notable for the following components:      Result Value   Sodium 127 (*)    Chloride  91 (*)    Glucose, Bld 196 (*)    BUN 40 (*)    Creatinine, Ser 2.08 (*)    Calcium 8.5 (*)    GFR, Estimated 23 (*)    All other components within normal limits  BRAIN NATRIURETIC PEPTIDE - Abnormal; Notable for the following components:   B Natriuretic Peptide 939.0 (*)    All other components within normal limits  CBC - Abnormal; Notable for the following components:   RBC 3.77 (*)    Hemoglobin 11.6 (*)    All other components within normal limits  MAGNESIUM    EKG None  Radiology DG Chest Portable 1 View  Result Date: 06/01/2023 CLINICAL DATA:  Bilateral lower extremity swelling and right upper extremity and abdominal swelling EXAM: PORTABLE  CHEST 1 VIEW COMPARISON:  05/26/2023 FINDINGS: Stable cardiomegaly. Aortic atherosclerotic calcification. Pulmonary vascular congestion. Small bilateral pleural effusions. Bibasilar atelectasis. No pneumothorax. No displaced rib fractures. IMPRESSION: Stable exam since 05/26/2023. Cardiomegaly and small bilateral pleural effusions. Electronically Signed   By: Minerva Fester M.D.   On: 06/01/2023 19:56    Procedures Procedures  {Document cardiac monitor, telemetry assessment procedure when appropriate:1}  Medications Ordered in ED Medications  furosemide (LASIX) injection 20 mg (20 mg Intravenous Given 06/01/23 1953)    ED Course/ Medical Decision Making/ A&P   {   Click here for ABCD2, HEART and other calculatorsREFRESH Note before signing :1}                              Medical Decision Making Amount and/or Complexity of Data Reviewed Labs: ordered. Radiology: ordered. ECG/medicine tests: ordered.  Risk Prescription drug management. Decision regarding hospitalization.  Patient with moderate congestive heart failure.  She will be admitted to medicine for some diuresis  {Document critical care time when appropriate:1} {Document review of labs and clinical decision tools ie heart score, Chads2Vasc2 etc:1}  {Document your independent review of radiology images, and any outside records:1} {Document your discussion with family members, caretakers, and with consultants:1} {Document social determinants of health affecting pt's care:1} {Document your decision making why or why not admission, treatments were needed:1} Final Clinical Impression(s) / ED Diagnoses Final diagnoses:  Systolic congestive heart failure, unspecified HF chronicity (HCC)    Rx / DC Orders ED Discharge Orders     None

## 2023-06-01 NOTE — Transitions of Care (Post Inpatient/ED Visit) (Signed)
06/01/2023  Name: Emily Jennings MRN: [id number] DOB: 01/28/1934  Today's TOC FU Call Status: Today's TOC FU Call Status:: Unsuccessful Call (3rd Attempt) Unsuccessful Call (3rd Attempt) Date: 06/01/23  Attempted to reach the patient regarding the most recent Inpatient/ED visit.  Follow Up Plan: No further outreach attempts will be made at this time. We have been unable to contact the patient.  Jodelle Gross RN, BSN, CCM [facility] RN Care Coordinator/ Transitions of Care Direct Dial: [phone]  Fax: [phone]

## 2023-06-01 NOTE — ED Notes (Signed)
ED TO INPATIENT HANDOFF REPORT  ED Nurse Name and Phone #:   S Name/Age/Gender Emily Jennings 89 y.o. female Room/Bed: APA18/APA18  Code Status   Code Status: Full Code  Home/SNF/Other Home Patient oriented to: self, place, time, and situation Is this baseline? Yes   Triage Complete: Triage complete  Chief Complaint Acute diastolic CHF (congestive heart failure) (HCC) [I50.31]  Triage Note C/o bilateral lower extremity swelling, right upper extremity, and abd swelling.  Denies cp/sob Hx chf.  Patient on hydrochlorothiazide and reports missing 4-5 doses.    Allergies Allergies  Allergen Reactions   Amlodipine Swelling   Benadryl [Diphenhydramine Hcl] Hypertension   Citalopram Other (See Comments)    unknown   Farxiga [Dapagliflozin] Other (See Comments)    DKA   Metformin And Related Diarrhea    Lost appetite and weight    Tramadol Other (See Comments)    Felt light headed and dizzy   Crestor [Rosuvastatin] Other (See Comments)    "Feet swelling", makes her feel weak. Pt takes 5mg  at home    Level of Care/Admitting Diagnosis ED Disposition     ED Disposition  Admit   Condition  --   Comment  Hospital Area: [facility] [[id number]]  Level of Care: Telemetry [5]  Covid Evaluation: Asymptomatic - no recent exposure (last 10 days) testing not required  Diagnosis: Acute diastolic CHF (congestive heart failure) (HCC) [161096]  Admitting Physician: Lilyan Gilford [[id number]]  Attending Physician: Lilyan Gilford [[id number]]          B Medical/Surgery History Past Medical History:  Diagnosis Date   Allergy    Anxiety    ANXIETY DISORDER, GENERALIZED 07/15/2007   Qualifier: Diagnosis of  By: Chipper Herb     Arthritis    Cancer [facility]) 2009   breast, carcinoma in situ left   Carcinoma in situ of breast 05/21/2008   Qualifier: Diagnosis of  By: [name] MD, Margaret  Diagnosed in 2009, completed 5 year course of tamoxifen, no evidence of  recurrence    Carotid stenosis    11/16/2005  mild plaque formation and stenosis proximal right ECA   Cataract    Complication of anesthesia    Coronary artery disease    cardiac catheterization on 03/20/2006  LAD mid 40% stenosis, left circumflex mild 40% stenosis, RCA mid-vessel 40% to 50% lesion   EF 60%   Diabetes mellitus    GERD (gastroesophageal reflux disease)    Hernia, inguinal    left   Hyperglycemia    Hypertension    Insomnia 11/16/2011   Low blood potassium    Non-insulin dependent type 2 diabetes mellitus (HCC)    Osteoporosis    Shortness of breath    2D Echocardiogram 01/26/2009   EF of greater than 55%, mild MR, mild TR, normal ventricular function   Thickened endometrium 10/26/2017   Noted by gyne in 2017, missed 6 month follow up, referred in 09/2017   Ventricular tachycardia, non-sustained (HCC)    developed during stress test 02/08/2006, spontaneously aborted, mild reversible apical defect   Past Surgical History:  Procedure Laterality Date   BREAST LUMPECTOMY Left 2009   Left breast 2009   CATARACT EXTRACTION W/PHACO Left 10/28/2014   Procedure: PHACO EMULSION CATARACT EXTRACTION WITH INTRAOCULAR LENS IMPLANT LEFT EYE (IOC);  Surgeon: Chalmers Guest, MD;  Location: [facility] OR;  Service: Ophthalmology;  Laterality: Left;   COLONOSCOPY     cyst removed from left foot     REFRACTIVE SURGERY  Left      A IV Location/Drains/Wounds Patient Lines/Drains/Airways Status     Active Line/Drains/Airways     Name Placement date Placement time Site Days   Peripheral IV 06/01/23 20 G 1" Right Antecubital 06/01/23  1900  Antecubital  less than 1            Intake/Output Last 24 hours No intake or output data in the 24 hours ending 06/01/23 2342  Labs/Imaging Results for orders placed or performed during the hospital encounter of 06/01/23 (from the past 48 hour(s))  Basic metabolic panel     Status: Abnormal   Collection Time: 06/01/23  6:56 PM  Result Value Ref Range    Sodium 127 (L) 135 - 145 mmol/L   Potassium 3.6 3.5 - 5.1 mmol/L   Chloride 91 (L) 98 - 111 mmol/L   CO2 26 22 - 32 mmol/L   Glucose, Bld 196 (H) 70 - 99 mg/dL    Comment: Glucose reference range applies only to samples taken after fasting for at least 8 hours.   BUN 40 (H) 8 - 23 mg/dL   Creatinine, Ser 0.10 (H) 0.44 - 1.00 mg/dL   Calcium 8.5 (L) 8.9 - 10.3 mg/dL   GFR, Estimated 23 (L) >60 mL/min    Comment: (NOTE) Calculated using the CKD-EPI Creatinine Equation (2021)    Anion gap 10 5 - 15    Comment: Performed at [facility], [address], [place], [place] [address]  Magnesium     Status: None   Collection Time: 06/01/23  6:56 PM  Result Value Ref Range   Magnesium 2.3 1.7 - 2.4 mg/dL    Comment: Performed at [facility], [address] [place], [place] [address]  Brain natriuretic peptide (order ONLY if patient c/o SOB)     Status: Abnormal   Collection Time: 06/01/23  6:56 PM  Result Value Ref Range   B Natriuretic Peptide 939.0 (H) 0.0 - 100.0 pg/mL    Comment: Performed at [facility] [address], [place], [place] [address]  CBC     Status: Abnormal   Collection Time: 06/01/23  6:56 PM  Result Value Ref Range   WBC 6.7 4.0 - 10.5 K/uL   RBC 3.77 (L) 3.87 - 5.11 MIL/uL   Hemoglobin 11.6 (L) 12.0 - 15.0 g/dL   HCT 59.5 63.8 - 75.6 %   MCV 95.8 80.0 - 100.0 fL   MCH 30.8 26.0 - 34.0 pg   MCHC 32.1 30.0 - 36.0 g/dL   RDW 43.3 29.5 - 18.8 %   Platelets 253 150 - 400 K/uL   nRBC 0.0 0.0 - 0.2 %    Comment: Performed at [facility], [address], [place], [place] [address]   *Note: Due to a large number of results and/or encounters for the requested time period, some results have not been displayed. A complete set of results can be found in Results Review.   DG Chest Portable 1 View  Result Date: 06/01/2023 CLINICAL DATA:  Bilateral lower extremity swelling and right upper extremity and abdominal swelling EXAM: PORTABLE CHEST 1 VIEW COMPARISON:  05/26/2023  FINDINGS: Stable cardiomegaly. Aortic atherosclerotic calcification. Pulmonary vascular congestion. Small bilateral pleural effusions. Bibasilar atelectasis. No pneumothorax. No displaced rib fractures. IMPRESSION: Stable exam since 05/26/2023. Cardiomegaly and small bilateral pleural effusions. Electronically Signed   By: Minerva Fester M.D.   On: 06/01/2023 19:56    Pending Labs [employer] (From admission, onward)     Start  Ordered   Signed and Held  Comprehensive metabolic panel  Tomorrow morning,   R        Signed and Held   Signed and Held  Magnesium  Tomorrow morning,   R        Signed and Held   Signed and Held  CBC with Differential/Platelet  Tomorrow morning,   R        Signed and Held            Vitals/Pain Today's Vitals   06/01/23 2245 06/01/23 2300 06/01/23 2315 06/01/23 2330  BP: (!) 150/60 (!) 146/58 (!) 143/58 137/69  Pulse: 80 76 72 67  Resp: (!) 21 18 20 13   Temp:      TempSrc:      SpO2: 93% 93% 93% 95%  Weight:      PainSc:        Isolation Precautions No active isolations  Medications Medications  furosemide (LASIX) injection 20 mg (20 mg Intravenous Given 06/01/23 1953)    Mobility walks     Focused Assessments    R Recommendations: See Admitting Provider Note  Report given to:   Additional Notes:

## 2023-06-02 DIAGNOSIS — E871 Hypo-osmolality and hyponatremia: Secondary | ICD-10-CM

## 2023-06-02 DIAGNOSIS — Z794 Long term (current) use of insulin: Secondary | ICD-10-CM

## 2023-06-02 DIAGNOSIS — Z23 Encounter for immunization: Secondary | ICD-10-CM | POA: Diagnosis not present

## 2023-06-02 DIAGNOSIS — E1165 Type 2 diabetes mellitus with hyperglycemia: Secondary | ICD-10-CM | POA: Diagnosis present

## 2023-06-02 DIAGNOSIS — N1832 Chronic kidney disease, stage 3b: Secondary | ICD-10-CM

## 2023-06-02 DIAGNOSIS — K219 Gastro-esophageal reflux disease without esophagitis: Secondary | ICD-10-CM | POA: Diagnosis present

## 2023-06-02 DIAGNOSIS — Z823 Family history of stroke: Secondary | ICD-10-CM | POA: Diagnosis not present

## 2023-06-02 DIAGNOSIS — E785 Hyperlipidemia, unspecified: Secondary | ICD-10-CM

## 2023-06-02 DIAGNOSIS — Z8249 Family history of ischemic heart disease and other diseases of the circulatory system: Secondary | ICD-10-CM | POA: Diagnosis not present

## 2023-06-02 DIAGNOSIS — Z8673 Personal history of transient ischemic attack (TIA), and cerebral infarction without residual deficits: Secondary | ICD-10-CM | POA: Diagnosis not present

## 2023-06-02 DIAGNOSIS — I130 Hypertensive heart and chronic kidney disease with heart failure and stage 1 through stage 4 chronic kidney disease, or unspecified chronic kidney disease: Secondary | ICD-10-CM | POA: Diagnosis present

## 2023-06-02 DIAGNOSIS — Z833 Family history of diabetes mellitus: Secondary | ICD-10-CM | POA: Diagnosis not present

## 2023-06-02 DIAGNOSIS — I5033 Acute on chronic diastolic (congestive) heart failure: Secondary | ICD-10-CM | POA: Diagnosis present

## 2023-06-02 DIAGNOSIS — I1A0 Resistant hypertension: Secondary | ICD-10-CM | POA: Diagnosis not present

## 2023-06-02 DIAGNOSIS — N1831 Chronic kidney disease, stage 3a: Secondary | ICD-10-CM | POA: Diagnosis not present

## 2023-06-02 DIAGNOSIS — I5031 Acute diastolic (congestive) heart failure: Secondary | ICD-10-CM | POA: Diagnosis not present

## 2023-06-02 DIAGNOSIS — I2510 Atherosclerotic heart disease of native coronary artery without angina pectoris: Secondary | ICD-10-CM | POA: Diagnosis present

## 2023-06-02 DIAGNOSIS — E876 Hypokalemia: Secondary | ICD-10-CM | POA: Diagnosis present

## 2023-06-02 DIAGNOSIS — E1122 Type 2 diabetes mellitus with diabetic chronic kidney disease: Secondary | ICD-10-CM | POA: Diagnosis not present

## 2023-06-02 DIAGNOSIS — N184 Chronic kidney disease, stage 4 (severe): Secondary | ICD-10-CM | POA: Diagnosis not present

## 2023-06-02 DIAGNOSIS — M7989 Other specified soft tissue disorders: Secondary | ICD-10-CM | POA: Diagnosis present

## 2023-06-02 DIAGNOSIS — N189 Chronic kidney disease, unspecified: Secondary | ICD-10-CM | POA: Diagnosis present

## 2023-06-02 DIAGNOSIS — Z86000 Personal history of in-situ neoplasm of breast: Secondary | ICD-10-CM | POA: Diagnosis not present

## 2023-06-02 DIAGNOSIS — Z79899 Other long term (current) drug therapy: Secondary | ICD-10-CM | POA: Diagnosis not present

## 2023-06-02 DIAGNOSIS — Z83438 Family history of other disorder of lipoprotein metabolism and other lipidemia: Secondary | ICD-10-CM | POA: Diagnosis not present

## 2023-06-02 DIAGNOSIS — M1990 Unspecified osteoarthritis, unspecified site: Secondary | ICD-10-CM | POA: Diagnosis present

## 2023-06-02 DIAGNOSIS — N179 Acute kidney failure, unspecified: Secondary | ICD-10-CM | POA: Diagnosis present

## 2023-06-02 DIAGNOSIS — F411 Generalized anxiety disorder: Secondary | ICD-10-CM | POA: Diagnosis present

## 2023-06-02 DIAGNOSIS — M810 Age-related osteoporosis without current pathological fracture: Secondary | ICD-10-CM | POA: Diagnosis present

## 2023-06-02 LAB — COMPREHENSIVE METABOLIC PANEL
ALT: 19 U/L (ref 0–44)
AST: 21 U/L (ref 15–41)
Albumin: 2.3 g/dL — ABNORMAL LOW (ref 3.5–5.0)
Alkaline Phosphatase: 52 U/L (ref 38–126)
Anion gap: 10 (ref 5–15)
BUN: 41 mg/dL — ABNORMAL HIGH (ref 8–23)
CO2: 27 mmol/L (ref 22–32)
Calcium: 7.8 mg/dL — ABNORMAL LOW (ref 8.9–10.3)
Chloride: 93 mmol/L — ABNORMAL LOW (ref 98–111)
Creatinine, Ser: 2.14 mg/dL — ABNORMAL HIGH (ref 0.44–1.00)
GFR, Estimated: 22 mL/min — ABNORMAL LOW (ref >60)
Glucose, Bld: 205 mg/dL — ABNORMAL HIGH (ref 70–99)
Potassium: 2.8 mmol/L — ABNORMAL LOW (ref 3.5–5.1)
Sodium: 130 mmol/L — ABNORMAL LOW (ref 135–145)
Total Bilirubin: 0.7 mg/dL (ref 0.3–1.2)
Total Protein: 4.1 g/dL — ABNORMAL LOW (ref 6.5–8.1)

## 2023-06-02 LAB — GLUCOSE, CAPILLARY
Glucose-Capillary: 202 mg/dL — ABNORMAL HIGH (ref 70–99)
Glucose-Capillary: 283 mg/dL — ABNORMAL HIGH (ref 70–99)
Glucose-Capillary: 176 mg/dL — ABNORMAL HIGH (ref 70–99)
Glucose-Capillary: 269 mg/dL — ABNORMAL HIGH (ref 70–99)

## 2023-06-02 LAB — CBC WITH DIFFERENTIAL/PLATELET
Abs Immature Granulocytes: 0.03 10*3/uL (ref 0.00–0.07)
Basophils Absolute: 0 10*3/uL (ref 0.0–0.1)
Basophils Relative: 1 %
Eosinophils Absolute: 0.1 10*3/uL (ref 0.0–0.5)
Eosinophils Relative: 3 %
HCT: 27.4 % — ABNORMAL LOW (ref 36.0–46.0)
Hemoglobin: 8.9 g/dL — ABNORMAL LOW (ref 12.0–15.0)
Immature Granulocytes: 1 %
Lymphocytes Relative: 21 %
Lymphs Abs: 1 10*3/uL (ref 0.7–4.0)
MCH: 30.8 pg (ref 26.0–34.0)
MCHC: 32.5 g/dL (ref 30.0–36.0)
MCV: 94.8 fL (ref 80.0–100.0)
Monocytes Absolute: 0.5 10*3/uL (ref 0.1–1.0)
Monocytes Relative: 11 %
Neutro Abs: 3.2 10*3/uL (ref 1.7–7.7)
Neutrophils Relative %: 63 %
Platelets: 193 10*3/uL (ref 150–400)
RBC: 2.89 MIL/uL — ABNORMAL LOW (ref 3.87–5.11)
RDW: 13.9 % (ref 11.5–15.5)
WBC: 4.9 10*3/uL (ref 4.0–10.5)
nRBC: 0 % (ref 0.0–0.2)

## 2023-06-02 LAB — MAGNESIUM: Magnesium: 2.2 mg/dL (ref 1.7–2.4)

## 2023-06-02 MED ORDER — OXYCODONE HCL 5 MG PO TABS: 5.0000 mg | ORAL_TABLET | ORAL | Status: DC | PRN

## 2023-06-02 MED ORDER — CARVEDILOL 12.5 MG PO TABS
25.0000 mg | ORAL_TABLET | Freq: Two times a day (BID) | ORAL | Status: DC
  Administered 2023-06-02 – 2023-06-04 (×5): 25 mg via ORAL
  Filled 2023-06-02 (×5): qty 2

## 2023-06-02 MED ORDER — ACETAMINOPHEN 650 MG RE SUPP: 650.0000 mg | Freq: Four times a day (QID) | RECTAL | Status: DC | PRN

## 2023-06-02 MED ORDER — NIFEDIPINE ER OSMOTIC RELEASE 30 MG PO TB24
60.0000 mg | ORAL_TABLET | Freq: Every day | ORAL | Status: DC
  Administered 2023-06-02 – 2023-06-04 (×3): 60 mg via ORAL
  Filled 2023-06-02 (×3): qty 2

## 2023-06-02 MED ORDER — INSULIN ASPART 100 UNIT/ML IJ SOLN
0.0000 [IU] | Freq: Every day | INTRAMUSCULAR | Status: DC
  Administered 2023-06-02: 2 [IU] via SUBCUTANEOUS
  Administered 2023-06-02: 3 [IU] via SUBCUTANEOUS

## 2023-06-02 MED ORDER — ONDANSETRON HCL 4 MG/2ML IJ SOLN: 4.0000 mg | Freq: Four times a day (QID) | INTRAMUSCULAR | Status: DC | PRN

## 2023-06-02 MED ORDER — FUROSEMIDE 10 MG/ML IJ SOLN
40.0000 mg | Freq: Every day | INTRAMUSCULAR | Status: DC
  Administered 2023-06-02: 40 mg via INTRAVENOUS
  Filled 2023-06-02: qty 4

## 2023-06-02 MED ORDER — ACETAMINOPHEN 325 MG PO TABS: 650.0000 mg | ORAL_TABLET | Freq: Four times a day (QID) | ORAL | Status: DC | PRN

## 2023-06-02 MED ORDER — CALCITRIOL 0.25 MCG PO CAPS
0.2500 ug | ORAL_CAPSULE | ORAL | Status: DC
  Administered 2023-06-04: 0.25 ug via ORAL
  Filled 2023-06-02: qty 1

## 2023-06-02 MED ORDER — ROSUVASTATIN CALCIUM 10 MG PO TABS
5.0000 mg | ORAL_TABLET | Freq: Every day | ORAL | Status: DC
  Administered 2023-06-02 – 2023-06-04 (×3): 5 mg via ORAL
  Filled 2023-06-02 (×3): qty 1

## 2023-06-02 MED ORDER — INSULIN ASPART 100 UNIT/ML IJ SOLN
0.0000 [IU] | Freq: Three times a day (TID) | INTRAMUSCULAR | Status: DC
  Administered 2023-06-02 (×2): 3 [IU] via SUBCUTANEOUS
  Administered 2023-06-02 – 2023-06-03 (×2): 8 [IU] via SUBCUTANEOUS
  Administered 2023-06-03: 11 [IU] via SUBCUTANEOUS
  Administered 2023-06-04 (×2): 5 [IU] via SUBCUTANEOUS

## 2023-06-02 MED ORDER — HEPARIN SODIUM (PORCINE) 5000 UNIT/ML IJ SOLN
5000.0000 [IU] | Freq: Three times a day (TID) | INTRAMUSCULAR | Status: DC
  Administered 2023-06-02 – 2023-06-04 (×7): 5000 [IU] via SUBCUTANEOUS
  Filled 2023-06-02 (×7): qty 1

## 2023-06-02 MED ORDER — HYDRALAZINE HCL 25 MG PO TABS
100.0000 mg | ORAL_TABLET | Freq: Three times a day (TID) | ORAL | Status: DC
  Administered 2023-06-02 – 2023-06-04 (×6): 100 mg via ORAL
  Filled 2023-06-02 (×5): qty 4

## 2023-06-02 MED ORDER — INFLUENZA VAC A&B SURF ANT ADJ 0.5 ML IM SUSY
0.5000 mL | PREFILLED_SYRINGE | INTRAMUSCULAR | Status: AC
  Administered 2023-06-03: 0.5 mL via INTRAMUSCULAR
  Filled 2023-06-02: qty 0.5

## 2023-06-02 MED ORDER — ONDANSETRON HCL 4 MG PO TABS: 4.0000 mg | ORAL_TABLET | Freq: Four times a day (QID) | ORAL | Status: DC | PRN

## 2023-06-02 NOTE — H&P (Signed)
History and Physical    Patient: Emily Jennings OZH:[id number] DOB: 1934-06-12 DOA: 06/01/2023 DOS: the patient was seen and examined on 06/02/2023 PCP: [name], MD  Patient coming from: Home  Chief Complaint:  Chief Complaint  Patient presents with   Leg Swelling   HPI: JONATHAN DANGEL is a 89 y.o. female with medical history significant of right cerebellar intraparenchymal hemorrhage due to hypertension, CKD stage III, type 2 diabetes mellitus, diastolic CHF, and more presents the ED with a chief complaint of swelling in her legs and shortness of breath.  Unfortunately, patient is not able to give me much meaningful history.  She is very somnolent, and repeats to me that she is sleeping several times.  When asked why she came into the hospital she started talking about her brain bleed from the last admission.  When I reminded her that she had told other people that her legs were swollen she then said yes she had peripheral edema and shortness of breath.  After several more times of her telling me she is sleeping I decided more meaningful history might be obtained during dayshift.  Her last admission was discharged on September 9.  That was the admission for the intraparenchymal hemorrhage due to hypertension.  She was treated with hypertonic saline and her cerebellar edema improved.  Aspirin was contraindicated due to the hemorrhage.  Her creatinine was quite high at the last admission as well.  She was advised to follow-up with nephrology for diuretic adjustment.  She had had some diuresis during that admission.  No further history obtained at this time.  Patient is marked full code since she is not awake enough to have this conversation. Review of Systems: unable to review all systems due to the inability of the patient to answer questions. Past Medical History:  Diagnosis Date   Allergy    Anxiety    ANXIETY DISORDER, GENERALIZED 07/15/2007   Qualifier: Diagnosis of  By: Chipper Herb     Arthritis    Cancer [facility]) 2009   breast, carcinoma in situ left   Carcinoma in situ of breast 05/21/2008   Qualifier: Diagnosis of  By: [name] MD, Margaret  Diagnosed in 2009, completed 5 year course of tamoxifen, no evidence of recurrence    Carotid stenosis    11/16/2005  mild plaque formation and stenosis proximal right ECA   Cataract    Complication of anesthesia    Coronary artery disease    cardiac catheterization on 03/20/2006  LAD mid 40% stenosis, left circumflex mild 40% stenosis, RCA mid-vessel 40% to 50% lesion   EF 60%   Diabetes mellitus    GERD (gastroesophageal reflux disease)    Hernia, inguinal    left   Hyperglycemia    Hypertension    Insomnia 11/16/2011   Low blood potassium    Non-insulin dependent type 2 diabetes mellitus (HCC)    Osteoporosis    Shortness of breath    2D Echocardiogram 01/26/2009   EF of greater than 55%, mild MR, mild TR, normal ventricular function   Thickened endometrium 10/26/2017   Noted by gyne in 2017, missed 6 month follow up, referred in 09/2017   Ventricular tachycardia, non-sustained (HCC)    developed during stress test 02/08/2006, spontaneously aborted, mild reversible apical defect   Past Surgical History:  Procedure Laterality Date   BREAST LUMPECTOMY Left 2009   Left breast 2009   CATARACT EXTRACTION W/PHACO Left 10/28/2014   Procedure: PHACO EMULSION CATARACT  EXTRACTION WITH INTRAOCULAR LENS IMPLANT LEFT EYE (IOC);  Surgeon: Chalmers Guest, MD;  Location: [facility] OR;  Service: Ophthalmology;  Laterality: Left;   COLONOSCOPY     cyst removed from left foot     REFRACTIVE SURGERY Left    Social History:  reports that she has never smoked. She has never been exposed to tobacco smoke. She has never used smokeless tobacco. She reports that she does not drink alcohol and does not use drugs.  Allergies  Allergen Reactions   Amlodipine Swelling   Benadryl [Diphenhydramine Hcl] Hypertension   Citalopram Other (See Comments)     unknown   Farxiga [Dapagliflozin] Other (See Comments)    DKA   Metformin And Related Diarrhea    Lost appetite and weight    Tramadol Other (See Comments)    Felt light headed and dizzy   Crestor [Rosuvastatin] Other (See Comments)    "Feet swelling", makes her feel weak. Pt takes 5mg  at home    Family History  Problem Relation Age of Onset   Hypertension Mother    Hyperlipidemia Mother    Stroke Mother    Urticaria Mother    Cancer Father        pancreatic   Colon cancer Father    Heart disease Brother 40       bypass   Heart disease Brother 28       bypass   Arthritis Other    Asthma Other    Diabetes Other    Colon cancer Paternal Aunt    Esophageal cancer Neg Hx    Stomach cancer Neg Hx    Rectal cancer Neg Hx     Prior to Admission medications   Medication Sig Start Date End Date Taking? Authorizing Provider  blood glucose meter kit and supplies KIT One touch Ultra. Use three times daily as directed. (FOR ICD E11.65) 10/27/20   Roma Kayser, MD  calcitRIOL (ROCALTROL) 0.25 MCG capsule Take 0.25 mcg by mouth 3 (three) times a week.    [provider]  carvedilol (COREG) 25 MG tablet Take 1 tablet (25 mg total) by mouth 2 (two) times daily with a meal. 05/28/23   Danford, Earl Lites, MD  Continuous Blood Gluc Sensor (DEXCOM G7 SENSOR) MISC 1 Device by Does not apply route as directed. 12/25/22   Shamleffer, Konrad Dolores, MD  glucose blood (ONETOUCH ULTRA) test strip USE TO TEST 4 TIMES A DAY 01/09/23   Roma Kayser, MD  hydrALAZINE (APRESOLINE) 100 MG tablet Take 1 tablet (100 mg total) by mouth 3 (three) times daily. 05/28/23   Danford, Earl Lites, MD  hydrochlorothiazide (HYDRODIURIL) 12.5 MG tablet Take 1 tablet (12.5 mg total) by mouth daily. 05/28/23   Danford, Earl Lites, MD  insulin degludec (TRESIBA FLEXTOUCH) 100 UNIT/ML FlexTouch Pen Inject 5 Units into the skin daily. 05/01/23   Shamleffer, Konrad Dolores, MD  insulin lispro  (HUMALOG KWIKPEN) 100 UNIT/ML KwikPen Inject 5 Units into the skin 3 (three) times daily. Max daily 30 units 05/01/23   Shamleffer, Konrad Dolores, MD  Insulin Pen Needle 32G X 4 MM MISC 1 each by Does not apply route in the morning and at bedtime. 12/25/22   Shamleffer, Konrad Dolores, MD  mupirocin ointment (BACTROBAN) 2 % Apply 1 Application topically 2 (two) times daily. 05/01/23   [name], MD  NIFEdipine (ADALAT CC) 60 MG 24 hr tablet Take 1 tablet (60 mg total) by mouth daily. 05/29/23   Danford, Earl Lites,  MD  OneTouch Delica Lancets 33G MISC 1 each by Does not apply route 3 (three) times daily. Use to check glucose three times daily 10/03/22   Roma Kayser, MD  rosuvastatin (CRESTOR) 5 MG tablet TAKE 1 TABLET (5 MG TOTAL) BY MOUTH DAILY. 01/15/23   [name], MD    Physical Exam: Vitals:   06/02/23 0008 06/02/23 0021 06/02/23 0435 06/02/23 0500  BP: (!) 157/62  (!) 166/58   Pulse: 78  73   Resp: (!) 22     Temp: 98 F (36.7 C)  98.7 F (37.1 C)   TempSrc:   Oral   SpO2: 100%  100%   Weight:    59 kg  Height:  5' (1.524 m)     1.  General: Patient lying supine in bed,  no acute distress   2. Psychiatric: Alert and oriented x 3, mood and behavior normal for situation, pleasant and cooperative with exam   3. Neurologic: Speech and language are normal, face is symmetric, moves all 4 extremities voluntarily, at baseline without acute deficits on limited exam   4. HEENMT:  Head is atraumatic, normocephalic, pupils reactive to light, neck is supple, trachea is midline, mucous membranes are moist   5. Respiratory : Lungs are clear to auscultation bilaterally without wheezing, rhonchi, rales, no cyanosis, no increase in work of breathing or accessory muscle use   6. Cardiovascular : Heart rate normal, rhythm is regular, murmur present, rubs or gallops, significant peripheral edema, peripheral pulses palpated   7. Gastrointestinal:  Abdomen is  soft, nondistended, nontender to palpation bowel sounds active, no masses or organomegaly palpated   8. Skin:  Skin is warm, dry and intact without rashes, acute lesions, or ulcers on limited exam   9.Musculoskeletal:  No acute deformities or trauma, no asymmetry in tone, significant peripheral edema, peripheral pulses palpated, no tenderness to palpation in the extremities  Data Reviewed: In the ED Temp 97.8, heart rate 72, respiratory rate 18-20, blood pressure 136/59-141/81, satting 96% No leukocytosis Creatinine improving at 2.08 BNP elevated compared to prior admission at 939 Glucose is elevated at 196 EKG shows a heart rate of 74, sinus rhythm, QTc 460 Lasix 20 mg given in the ED  On chart review Last echo showed an ejection fraction of 65-70% with grade 2 diastolic dysfunction Chest x-ray shows cardiomegaly and small bilateral pleural effusion Assessment and Plan: * Acute diastolic CHF (congestive heart failure) (HCC) -continue beta blocker, crestor, spironolactone  Resistant hypertension -continue coreg, hydralazine, procardia -continue spironolactone -hydrochlorothiazide was stopped at the last admission  Hyperlipidemia -Continue statin  Chronic kidney disease (CKD) stage G3b/A2, moderately decreased glomerular filtration rate (GFR) between 30-44 mL/min/1.73 square meter and albuminuria creatinine ratio between 30-299 mg/g (HCC) -Cr baseline 1.6 -Cr up to 2.9 at the last admission -Cr today 2.08 -Cardio/renal syndrome most likely -Continue diuresis   Hyponatremia -hypervolemic hyponatremia -Na 127 -Continue lasix -continue to monitor  Type 2 diabetes mellitus with stage 3a chronic kidney disease, with long-term current use of insulin (HCC) -5 units of long acting insulin at baseline -Sliding scale coverage -Continue to monitor      Advance Care Planning:   Code Status: Full Code  Consults: None at this time  Family Communication: No family at  bedside  Severity of Illness: The appropriate patient status for this patient is OBSERVATION. Observation status is judged to be reasonable and necessary in order to provide the required intensity of service to ensure the patient's safety.  The patient's presenting symptoms, physical exam findings, and initial radiographic and laboratory data in the context of their medical condition is felt to place them at decreased risk for further clinical deterioration. Furthermore, it is anticipated that the patient will be medically stable for discharge from the hospital within 2 midnights of admission.   Author: Lilyan Gilford, DO 06/02/2023 5:14 AM  For on call review [email or web address].

## 2023-06-02 NOTE — Assessment & Plan Note (Signed)
-  continue coreg, hydralazine, procardia -continue spironolactone -hydrochlorothiazide was stopped at the last admission

## 2023-06-02 NOTE — Progress Notes (Signed)
ASSUMPTION OF CARE NOTE   06/02/2023 5:09 PM  Emily Jennings was seen and examined.  The H&P by the admitting provider, orders, imaging was reviewed.  Please see new orders.  Will continue to follow.   Vitals:   06/02/23 1037 06/02/23 1359  BP: (!) 151/58 (!) 134/55  Pulse:  72  Resp: 16 18  Temp: 98.3 F (36.8 C) 98.3 F (36.8 C)  SpO2: 99% 99%    Results for orders placed or performed during the hospital encounter of 06/01/23  Basic metabolic panel  Result Value Ref Range   Sodium 127 (L) 135 - 145 mmol/L   Potassium 3.6 3.5 - 5.1 mmol/L   Chloride 91 (L) 98 - 111 mmol/L   CO2 26 22 - 32 mmol/L   Glucose, Bld 196 (H) 70 - 99 mg/dL   BUN 40 (H) 8 - 23 mg/dL   Creatinine, Ser 1.61 (H) 0.44 - 1.00 mg/dL   Calcium 8.5 (L) 8.9 - 10.3 mg/dL   GFR, Estimated 23 (L) >60 mL/min   Anion gap 10 5 - 15  Magnesium  Result Value Ref Range   Magnesium 2.3 1.7 - 2.4 mg/dL  Brain natriuretic peptide (order ONLY if patient c/o SOB)  Result Value Ref Range   B Natriuretic Peptide 939.0 (H) 0.0 - 100.0 pg/mL  CBC  Result Value Ref Range   WBC 6.7 4.0 - 10.5 K/uL   RBC 3.77 (L) 3.87 - 5.11 MIL/uL   Hemoglobin 11.6 (L) 12.0 - 15.0 g/dL   HCT 09.6 04.5 - 40.9 %   MCV 95.8 80.0 - 100.0 fL   MCH 30.8 26.0 - 34.0 pg   MCHC 32.1 30.0 - 36.0 g/dL   RDW 81.1 91.4 - 78.2 %   Platelets 253 150 - 400 K/uL   nRBC 0.0 0.0 - 0.2 %  Comprehensive metabolic panel  Result Value Ref Range   Sodium 130 (L) 135 - 145 mmol/L   Potassium 2.8 (L) 3.5 - 5.1 mmol/L   Chloride 93 (L) 98 - 111 mmol/L   CO2 27 22 - 32 mmol/L   Glucose, Bld 205 (H) 70 - 99 mg/dL   BUN 41 (H) 8 - 23 mg/dL   Creatinine, Ser 9.56 (H) 0.44 - 1.00 mg/dL   Calcium 7.8 (L) 8.9 - 10.3 mg/dL   Total Protein 4.1 (L) 6.5 - 8.1 g/dL   Albumin 2.3 (L) 3.5 - 5.0 g/dL   AST 21 15 - 41 U/L   ALT 19 0 - 44 U/L   Alkaline Phosphatase 52 38 - 126 U/L   Total Bilirubin 0.7 0.3 - 1.2 mg/dL   GFR, Estimated 22 (L) >60 mL/min   Anion gap  10 5 - 15  Magnesium  Result Value Ref Range   Magnesium 2.2 1.7 - 2.4 mg/dL  CBC with Differential/Platelet  Result Value Ref Range   WBC 4.9 4.0 - 10.5 K/uL   RBC 2.89 (L) 3.87 - 5.11 MIL/uL   Hemoglobin 8.9 (L) 12.0 - 15.0 g/dL   HCT 21.3 (L) 08.6 - 57.8 %   MCV 94.8 80.0 - 100.0 fL   MCH 30.8 26.0 - 34.0 pg   MCHC 32.5 30.0 - 36.0 g/dL   RDW 46.9 62.9 - 52.8 %   Platelets 193 150 - 400 K/uL   nRBC 0.0 0.0 - 0.2 %   Neutrophils Relative % 63 %   Neutro Abs 3.2 1.7 - 7.7 K/uL   Lymphocytes Relative 21 %  Lymphs Abs 1.0 0.7 - 4.0 K/uL   Monocytes Relative 11 %   Monocytes Absolute 0.5 0.1 - 1.0 K/uL   Eosinophils Relative 3 %   Eosinophils Absolute 0.1 0.0 - 0.5 K/uL   Basophils Relative 1 %   Basophils Absolute 0.0 0.0 - 0.1 K/uL   Immature Granulocytes 1 %   Abs Immature Granulocytes 0.03 0.00 - 0.07 K/uL  Glucose, capillary  Result Value Ref Range   Glucose-Capillary 202 (H) 70 - 99 mg/dL  Glucose, capillary  Result Value Ref Range   Glucose-Capillary 283 (H) 70 - 99 mg/dL  Glucose, capillary  Result Value Ref Range   Glucose-Capillary 176 (H) 70 - 99 mg/dL   *Note: Due to a large number of results and/or encounters for the requested time period, some results have not been displayed. A complete set of results can be found in Results Review.     Maryln Manuel, MD [facility]   06/01/2023  6:21 PM How to contact the [facility] Attending or Consulting provider 7A - 7P or covering provider during after hours 7P -7A, for this patient?  Check the care team in [facility] and look for a) attending/consulting TRH provider listed and b) the [facility] team listed Log into [email or web address] and use 's universal password to access. If you do not have the password, please contact the hospital operator. Locate the [facility] provider you are looking for under [facility] and page to a number that you can be directly reached. If you still have difficulty reaching the provider, please  page the [facility] (Director on Call) for the Hospitalists listed on amion for assistance.

## 2023-06-02 NOTE — Assessment & Plan Note (Signed)
-  hypervolemic hyponatremia -Na 127 -Continue lasix -continue to monitor

## 2023-06-02 NOTE — Assessment & Plan Note (Signed)
-  Cr baseline 1.6 -Cr up to 2.9 at the last admission -Cr today 2.08 -Cardio/renal syndrome most likely -Continue diuresis

## 2023-06-02 NOTE — Assessment & Plan Note (Signed)
Continue statin. 

## 2023-06-02 NOTE — Progress Notes (Signed)
   06/02/23 1200  ReDS Vest / Clip  Station Marker A  Ruler Value 21  ReDS Value Range < 36  ReDS Actual Value 24

## 2023-06-02 NOTE — Assessment & Plan Note (Signed)
-  5 units of long acting insulin at baseline -Sliding scale coverage -Continue to monitor

## 2023-06-02 NOTE — Assessment & Plan Note (Signed)
-  continue beta blocker, crestor, spironolactone

## 2023-06-02 NOTE — TOC Initial Note (Signed)
Transition of Care [facility]) - Initial/Assessment Note    Patient Details  Name: Emily Jennings MRN: [id number] Date of Birth: March 11, 1934  [facility] CM/SW Contact:    Villa Herb, LCSWA Phone Number: 06/02/2023, 2:08 PM  Clinical Narrative:                 CSW met with pt at bedside to complete assessment. Pt sates she lives with her spouse and son. Pts spouse is able to assist with ADLs if needed. Pts family provides transportation when needed. Pt was recently set up with [facility] through [facility] at last hospital admission on 9/9. Pt uses a walker in the home. TOC to follow.   Expected Discharge Plan: Home w Home Health Services Barriers to Discharge: Continued Medical Work up   Patient Goals and CMS Choice Patient states their goals for this hospitalization and ongoing recovery are:: return home CMS Medicare.gov Compare Post Acute Care list provided to:: Patient Choice offered to / list presented to : Patient      Expected Discharge Plan and Services In-house Referral: Clinical Social Work Discharge Planning Services: CM Consult Post Acute Care Choice: Home Health Living arrangements for the past 2 months: Single Family Home                                      Prior Living Arrangements/Services Living arrangements for the past 2 months: Single Family Home Lives with:: Spouse, Adult Children Patient language and need for interpreter reviewed:: Yes Do you feel safe going back to the place where you live?: Yes      Need for Family Participation in Patient Care: Yes (Comment) Care giver support system in place?: Yes (comment) Current home services: DME Criminal Activity/Legal Involvement Pertinent to Current Situation/Hospitalization: No - Comment as needed  Activities of Daily Living Home Assistive Devices/Equipment: Cane (specify quad or straight), Walker (specify type), Shower chair with back ADL Screening (condition at time of admission) Patient's  cognitive ability adequate to safely complete daily activities?: Yes Is the patient deaf or have difficulty hearing?: Yes Does the patient have difficulty seeing, even when wearing glasses/contacts?: Yes Does the patient have difficulty concentrating, remembering, or making decisions?: Yes (at times) Patient able to express need for assistance with ADLs?: Yes Does the patient have difficulty dressing or bathing?: No Independently performs ADLs?: No Communication: Independent Dressing (OT): Needs assistance Is this a change from baseline?: Pre-admission baseline Grooming: Independent Feeding: Independent Bathing: Needs assistance Is this a change from baseline?: Pre-admission baseline Toileting: Needs assistance Is this a change from baseline?: Pre-admission baseline In/Out Bed: Needs assistance Is this a change from baseline?: Pre-admission baseline Walks in Home: Independent with device (comment) (cane) Does the patient have difficulty walking or climbing stairs?: Yes Weakness of Legs: Right Weakness of Arms/Hands: None  Permission Sought/Granted                  Emotional Assessment Appearance:: Appears stated age Attitude/Demeanor/Rapport: Engaged Affect (typically observed): Accepting Orientation: : Oriented to Self, Oriented to Place, Oriented to  Time, Oriented to Situation Alcohol / Substance Use: Not Applicable Psych Involvement: No (comment)  Admission diagnosis:  Acute diastolic CHF (congestive heart failure) (HCC) [I50.31] Systolic congestive heart failure, unspecified HF chronicity (HCC) [I50.20] Patient Active Problem List   Diagnosis Date Noted   Acute diastolic CHF (congestive heart failure) (HCC) 06/01/2023   Cerebellar edema (HCC)  05/23/2023   Hyperlipidemia 05/23/2023   DNR (do not resuscitate) 05/17/2023   Vocal cord dysfunction 05/17/2023   DNR (do not resuscitate) discussion 05/17/2023   Goals of care, counseling/discussion 05/17/2023   Right  cerebellar intraparenchymal hemorrhage due to hypertension 05/16/2023   Unsteady gait when walking 04/03/2023   Hospital discharge follow-up 03/06/2023   Bilateral kidney stones 03/06/2023   Hip swelling, right 02/27/2023   DKA (diabetic ketoacidosis) (HCC) 02/17/2023   High anion gap metabolic acidosis 02/17/2023   Pseudohyponatremia 02/17/2023   Hypoalbuminemia due to protein-calorie malnutrition (HCC) 02/17/2023   Dehydration 02/17/2023   Hip pain 02/15/2023   Encounter for Medicare annual examination with abnormal findings 10/16/2022   Vitamin D deficiency 10/16/2022   Acute exacerbation of CHF (congestive heart failure) (HCC) 09/13/2022   Acute on chronic congestive heart failure with left ventricular diastolic dysfunction (HCC) 09/13/2022   Chronic kidney disease (CKD) stage G3b/A2, moderately decreased glomerular filtration rate (GFR) between 30-44 mL/min/1.73 square meter and albuminuria creatinine ratio between 30-299 mg/g (HCC) 09/13/2022   Mild aortic valve stenosis 09/13/2022   Dyspnea on exertion 09/05/2022   CHF (congestive heart failure) (HCC) 09/05/2022   Acute on chronic diastolic (congestive) heart failure (HCC) 09/05/2022   Hyperkalemia 04/09/2022   Hyperglycemic crisis due to diabetes mellitus (HCC) 03/25/2022   Hyponatremia 03/25/2022   Acute metabolic encephalopathy 03/22/2022   Hypothermia 03/22/2022   Heart murmur 03/22/2022   Bilateral lower extremity edema 03/22/2022   Syncope and collapse 01/13/2021   Lip swelling 10/30/2020   CKD stage 4 due to type 2 diabetes mellitus (HCC) 10/30/2020   Tubular adenoma of colon 10/26/2017   Osteoporosis 07/28/2014   Diabetic hypertension-nephrosis syndrome (HCC) 03/23/2014   Type 2 diabetes mellitus with stage 3a chronic kidney disease, with long-term current use of insulin (HCC) 09/29/2013   Allergic rhinitis 01/27/2013   GERD (gastroesophageal reflux disease) 01/12/2013   CVD (cardiovascular disease) 07/17/2011    Hypokalemia 08/31/2008   Hyperlipidemia LDL goal <100 07/15/2007   Resistant hypertension 07/15/2007   PCP:  [name], MD Pharmacy:   [facility] [phone] - [place], [place] - [address] AT [facility] [address] [place] [address] Phone: [phone] Fax: [phone]  [facility]) - [place], [place] - [address] [facility] [address] [place] [address] [place] [address] Phone: [phone] Fax: [phone]  [facility] [address] [place] [address] Phone: [phone] Fax: [phone]     Social Determinants of Health (SDOH) Social History: SDOH Screenings   Food Insecurity: No Food Insecurity (06/02/2023)  Housing: Low Risk  (06/02/2023)  Transportation Needs: No Transportation Needs (06/02/2023)  Utilities: Not At Risk (06/02/2023)  Alcohol Screen: Low Risk  (11/15/2022)  Depression (PHQ2-9): Low Risk  (05/16/2023)  Financial Resource Strain: Low Risk  (12/28/2022)  Physical Activity: Sufficiently Active (11/15/2022)  Social Connections: Moderately Integrated (11/15/2022)  Stress: No Stress Concern Present (12/28/2022)  Tobacco Use: Low Risk  (06/01/2023)   SDOH Interventions:     Readmission Risk Interventions    02/19/2023    8:06 AM  Readmission Risk Prevention Plan  Transportation Screening Complete  HRI or Home Care Consult Complete  Social Work Consult for Recovery Care Planning/Counseling Complete  Palliative Care Screening Not Applicable  Medication Review Oceanographer) Complete

## 2023-06-03 DIAGNOSIS — I1A0 Resistant hypertension: Secondary | ICD-10-CM | POA: Diagnosis not present

## 2023-06-03 DIAGNOSIS — E785 Hyperlipidemia, unspecified: Secondary | ICD-10-CM | POA: Diagnosis not present

## 2023-06-03 DIAGNOSIS — I5031 Acute diastolic (congestive) heart failure: Secondary | ICD-10-CM | POA: Diagnosis not present

## 2023-06-03 DIAGNOSIS — N1832 Chronic kidney disease, stage 3b: Secondary | ICD-10-CM | POA: Diagnosis not present

## 2023-06-03 LAB — BASIC METABOLIC PANEL
Anion gap: 11 (ref 5–15)
BUN: 43 mg/dL — ABNORMAL HIGH (ref 8–23)
CO2: 27 mmol/L (ref 22–32)
Calcium: 7.9 mg/dL — ABNORMAL LOW (ref 8.9–10.3)
Chloride: 91 mmol/L — ABNORMAL LOW (ref 98–111)
Creatinine, Ser: 2.22 mg/dL — ABNORMAL HIGH (ref 0.44–1.00)
GFR, Estimated: 21 mL/min — ABNORMAL LOW (ref >60)
Glucose, Bld: 213 mg/dL — ABNORMAL HIGH (ref 70–99)
Potassium: 2.8 mmol/L — ABNORMAL LOW (ref 3.5–5.1)
Sodium: 129 mmol/L — ABNORMAL LOW (ref 135–145)

## 2023-06-03 LAB — GLUCOSE, CAPILLARY
Glucose-Capillary: 298 mg/dL — ABNORMAL HIGH (ref 70–99)
Glucose-Capillary: 349 mg/dL — ABNORMAL HIGH (ref 70–99)
Glucose-Capillary: 95 mg/dL (ref 70–99)
Glucose-Capillary: 76 mg/dL (ref 70–99)

## 2023-06-03 LAB — MAGNESIUM: Magnesium: 2.2 mg/dL (ref 1.7–2.4)

## 2023-06-03 MED ORDER — INSULIN ASPART 100 UNIT/ML IJ SOLN
5.0000 [IU] | Freq: Three times a day (TID) | INTRAMUSCULAR | Status: DC
  Administered 2023-06-03 – 2023-06-04 (×3): 5 [IU] via SUBCUTANEOUS

## 2023-06-03 MED ORDER — POTASSIUM CHLORIDE CRYS ER 20 MEQ PO TBCR
40.0000 meq | EXTENDED_RELEASE_TABLET | Freq: Two times a day (BID) | ORAL | Status: AC
  Administered 2023-06-03 (×2): 40 meq via ORAL
  Filled 2023-06-03 (×2): qty 2

## 2023-06-03 MED ORDER — INSULIN GLARGINE-YFGN 100 UNIT/ML ~~LOC~~ SOLN
8.0000 [IU] | Freq: Every day | SUBCUTANEOUS | Status: DC
  Administered 2023-06-03 – 2023-06-04 (×2): 8 [IU] via SUBCUTANEOUS
  Filled 2023-06-03 (×3): qty 0.08

## 2023-06-03 MED ORDER — INSULIN ASPART 100 UNIT/ML IJ SOLN
3.0000 [IU] | Freq: Three times a day (TID) | INTRAMUSCULAR | Status: DC
  Administered 2023-06-03: 3 [IU] via SUBCUTANEOUS

## 2023-06-03 NOTE — Hospital Course (Signed)
89 y.o. female with medical history significant of right cerebellar intraparenchymal hemorrhage due to hypertension, CKD stage III, type 2 diabetes mellitus, diastolic CHF, and more presents the ED with a chief complaint of swelling in her legs and shortness of breath.  Unfortunately, patient is not able to give me much meaningful history.  She is very somnolent, and repeats to me that she is sleeping several times.  When asked why she came into the hospital she started talking about her brain bleed from the last admission.  When I reminded her that she had told other people that her legs were swollen she then said yes she had peripheral edema and shortness of breath.  After several more times of her telling me she is sleeping I decided more meaningful history might be obtained during dayshift.  Her last admission was discharged on September 9.  That was the admission for the intraparenchymal hemorrhage due to hypertension.  She was treated with hypertonic saline and her cerebellar edema improved.  Aspirin was contraindicated due to the hemorrhage.  Her creatinine was quite high at the last admission as well.  She was advised to follow-up with nephrology for diuretic adjustment.  She had had some diuresis during that admission.  No further history obtained at this time.

## 2023-06-03 NOTE — Progress Notes (Signed)
PROGRESS NOTE   Emily Jennings  YQM:[id number] DOB: Aug 31, 1934 DOA: 06/01/2023 PCP: [name], MD   Chief Complaint  Patient presents with   Leg Swelling   Level of care: Telemetry  Brief Admission History:  89 y.o. female with medical history significant of right cerebellar intraparenchymal hemorrhage due to hypertension, CKD stage III, type 2 diabetes mellitus, diastolic CHF, and more presents the ED with a chief complaint of swelling in her legs and shortness of breath.  Unfortunately, patient is not able to give me much meaningful history.  She is very somnolent, and repeats to me that she is sleeping several times.  When asked why she came into the hospital she started talking about her brain bleed from the last admission.  When I reminded her that she had told other people that her legs were swollen she then said yes she had peripheral edema and shortness of breath.  After several more times of her telling me she is sleeping I decided more meaningful history might be obtained during dayshift.  Her last admission was discharged on September 9.  That was the admission for the intraparenchymal hemorrhage due to hypertension.  She was treated with hypertonic saline and her cerebellar edema improved.  Aspirin was contraindicated due to the hemorrhage.  Her creatinine was quite high at the last admission as well.  She was advised to follow-up with nephrology for diuretic adjustment.  She had had some diuresis during that admission.  No further history obtained at this time.    Assessment and Plan:  Acute HFpEF -pt has diuresed well after receiving IV furosemide dose   Intake/Output Summary (Last 24 hours) at 06/03/2023 1212 Last data filed at 06/03/2023 0900 Gross per 24 hour  Intake 1020 ml  Output 1450 ml  Net -430 ml   Filed Weights   06/01/23 1818 06/02/23 0500 06/03/23 0441  Weight: 52.6 kg 59 kg 58.1 kg   Hyperlipidemia -Continue statin  Chronic kidney disease (CKD) stage  G3b/A2, moderately decreased glomerular filtration rate (GFR) between 30-44 mL/min/1.73 square meter and albuminuria creatinine ratio between 30-299 mg/g (HCC) -Cr baseline 1.6 -Cr up to 2.9 at the last admission -Cr 2.2 today, holding furosemide today -recheck renal function panel in AM  Hyponatremia -hypervolemic hyponatremia -Na 127  -->129  -reassess in AM   Uncontrolled Type 2 diabetes mellitus with stage 3a chronic kidney disease, with long-term current use of insulin with hyperglycemia  -5 units of long acting insulin at baseline -glargine 8 units ordered, increased novolog to 5 units TID with meals plus SSI  CBG (last 3)  Recent Labs    06/02/23 2123 06/03/23 0803 06/03/23 1140  GLUCAP 269* 298* 349*   Resistant hypertension -continue coreg, hydralazine, procardia -continue spironolactone -hydrochlorothiazide was stopped at the last admission  DVT prophylaxis: SQ heparin Code Status: Full  Family Communication:  Disposition: anticipate DC home in 1-2 days   Consultants:   Procedures:   Antimicrobials:    Subjective: Reports she urinated quite a lot yesterday especially in the early evening  Objective: Vitals:   06/02/23 1359 06/02/23 1922 06/03/23 0440 06/03/23 0441  BP: (!) 134/55 (!) 133/58 (!) 162/60   Pulse: 72 70 73   Resp: 18  20   Temp: 98.3 F (36.8 C) 98.3 F (36.8 C) 98.4 F (36.9 C)   TempSrc:  Oral Oral   SpO2: 99% 98% 98%   Weight:    58.1 kg  Height:  Intake/Output Summary (Last 24 hours) at 06/03/2023 1210 Last data filed at 06/03/2023 0900 Gross per 24 hour  Intake 1020 ml  Output 1450 ml  Net -430 ml   Filed Weights   06/01/23 1818 06/02/23 0500 06/03/23 0441  Weight: 52.6 kg 59 kg 58.1 kg   Examination:  General exam: Appears calm and comfortable  Respiratory system: Clear to auscultation. Respiratory effort normal. Cardiovascular system: normal S1 & S2 heard. No JVD, murmurs, rubs, gallops or clicks. No pedal  edema. Gastrointestinal system: Abdomen is nondistended, soft and nontender. No organomegaly or masses felt. Normal bowel sounds heard. Central nervous system: Alert and oriented. No focal neurological deficits. Extremities: Symmetric 5 x 5 power. Skin: dimpling of skin in lower legs from volume removal. Psychiatry: Judgement and insight appear normal. Mood & affect appropriate.   Data Reviewed: I have personally reviewed following labs and imaging studies  CBC: Recent Labs  Lab 06/01/23 1856 06/02/23 0500  WBC 6.7 4.9  NEUTROABS  --  3.2  HGB 11.6* 8.9*  HCT 36.1 27.4*  MCV 95.8 94.8  PLT 253 193    Basic Metabolic Panel: Recent Labs  Lab 05/28/23 0722 06/01/23 1856 06/02/23 0500 06/03/23 0540  NA 135 127* 130* 129*  K 3.4* 3.6 2.8* 2.8*  CL 101 91* 93* 91*  CO2 26 26 27 27   GLUCOSE 80 196* 205* 213*  BUN 48* 40* 41* 43*  CREATININE 2.12* 2.08* 2.14* 2.22*  CALCIUM 8.3* 8.5* 7.8* 7.9*  MG  --  2.3 2.2 2.2    CBG: Recent Labs  Lab 06/02/23 1140 06/02/23 1621 06/02/23 2123 06/03/23 0803 06/03/23 1140  GLUCAP 283* 176* 269* 298* 349*    No results found for this or any previous visit (from the past 240 hour(s)).   Radiology Studies: DG Chest Portable 1 View  Result Date: 06/01/2023 CLINICAL DATA:  Bilateral lower extremity swelling and right upper extremity and abdominal swelling EXAM: PORTABLE CHEST 1 VIEW COMPARISON:  05/26/2023 FINDINGS: Stable cardiomegaly. Aortic atherosclerotic calcification. Pulmonary vascular congestion. Small bilateral pleural effusions. Bibasilar atelectasis. No pneumothorax. No displaced rib fractures. IMPRESSION: Stable exam since 05/26/2023. Cardiomegaly and small bilateral pleural effusions. Electronically Signed   By: Minerva Fester M.D.   On: 06/01/2023 19:56    Scheduled Meds:  [START ON 06/04/2023] calcitRIOL  0.25 mcg Oral Once per day on Monday Wednesday Friday   carvedilol  25 mg Oral BID WC   heparin  5,000 Units  Subcutaneous Q8H   hydrALAZINE  100 mg Oral TID   insulin aspart  0-15 Units Subcutaneous TID WC   insulin aspart  0-5 Units Subcutaneous QHS   insulin aspart  5 Units Subcutaneous TID WC   insulin glargine-yfgn  8 Units Subcutaneous Daily   NIFEdipine  60 mg Oral Daily   potassium chloride  40 mEq Oral BID   rosuvastatin  5 mg Oral Daily   Continuous Infusions:   LOS: 1 day   Time spent: 53 mins  Javaughn Opdahl Laural Benes, MD How to contact the [facility] Attending or Consulting provider 7A - 7P or covering provider during after hours 7P -7A, for this patient?  Check the care team in [facility] and look for a) attending/consulting [facility] provider listed and b) the [facility] team listed Log into [email or web address] and use [facility] 's universal password to access. If you do not have the password, please contact the hospital operator. Locate the [facility] provider you are looking for under [facility] and page to a number that you  can be directly reached. If you still have difficulty reaching the provider, please page the [facility] (Director on Call) for the Hospitalists listed on amion for assistance.  06/03/2023, 12:10 PM

## 2023-06-03 NOTE — Plan of Care (Signed)
  Problem: Coping: Goal: Will verbalize positive feelings about self Outcome: Completed/Met   Problem: Intracerebral Hemorrhage Tissue Perfusion: Goal: Complications of Intracerebral Hemorrhage will be minimized Outcome: Completed/Met

## 2023-06-03 NOTE — Progress Notes (Signed)
Patient has rested well this shift. Vitals stable.  No complaints of pain but still has sob with exertion, although no oxygen required.  Patient weight this am is 58 kg.

## 2023-06-04 DIAGNOSIS — N184 Chronic kidney disease, stage 4 (severe): Secondary | ICD-10-CM

## 2023-06-04 DIAGNOSIS — E871 Hypo-osmolality and hyponatremia: Secondary | ICD-10-CM | POA: Diagnosis not present

## 2023-06-04 DIAGNOSIS — I5031 Acute diastolic (congestive) heart failure: Secondary | ICD-10-CM | POA: Diagnosis not present

## 2023-06-04 DIAGNOSIS — I1A0 Resistant hypertension: Secondary | ICD-10-CM | POA: Diagnosis not present

## 2023-06-04 LAB — RENAL FUNCTION PANEL
Albumin: 2.2 g/dL — ABNORMAL LOW (ref 3.5–5.0)
Anion gap: 10 (ref 5–15)
BUN: 44 mg/dL — ABNORMAL HIGH (ref 8–23)
CO2: 27 mmol/L (ref 22–32)
Calcium: 8 mg/dL — ABNORMAL LOW (ref 8.9–10.3)
Chloride: 95 mmol/L — ABNORMAL LOW (ref 98–111)
Creatinine, Ser: 2.18 mg/dL — ABNORMAL HIGH (ref 0.44–1.00)
GFR, Estimated: 21 mL/min — ABNORMAL LOW (ref >60)
Glucose, Bld: 182 mg/dL — ABNORMAL HIGH (ref 70–99)
Phosphorus: 3.1 mg/dL (ref 2.5–4.6)
Potassium: 3.4 mmol/L — ABNORMAL LOW (ref 3.5–5.1)
Sodium: 132 mmol/L — ABNORMAL LOW (ref 135–145)

## 2023-06-04 LAB — GLUCOSE, CAPILLARY
Glucose-Capillary: 159 mg/dL — ABNORMAL HIGH (ref 70–99)
Glucose-Capillary: 229 mg/dL — ABNORMAL HIGH (ref 70–99)
Glucose-Capillary: 236 mg/dL — ABNORMAL HIGH (ref 70–99)
Glucose-Capillary: 243 mg/dL — ABNORMAL HIGH (ref 70–99)

## 2023-06-04 MED ORDER — POTASSIUM CHLORIDE CRYS ER 10 MEQ PO TBCR: 10.0000 meq | EXTENDED_RELEASE_TABLET | ORAL | 0 refills | Status: DC

## 2023-06-04 MED ORDER — FUROSEMIDE 40 MG PO TABS
40.0000 mg | ORAL_TABLET | ORAL | 0 refills | Status: AC
Start: 2023-06-05 — End: 2024-06-04

## 2023-06-04 NOTE — Discharge Summary (Signed)
microvascular disease. Vascular: Atherosclerotic calcification of the vertebral and internal carotid arteries at the skull base. No abnormal hyperdensity of the major intracranial arteries or dural venous sinuses. Skull: The visualized skull base, calvarium and extracranial soft tissues are normal. Sinuses/Orbits: Right sphenoid sinus opacification. The orbits are normal. IMPRESSION: Unchanged appearance of intraparenchymal hematoma in the right cerebellum. Electronically Signed   By: Deatra Robinson M.D.   On: 05/17/2023 23:32   MR BRAIN WO CONTRAST  Result Date: 05/17/2023 CLINICAL DATA:  Stroke, hemorrhagic EXAM: MRI HEAD WITHOUT CONTRAST TECHNIQUE: Multiplanar, multiecho pulse sequences of the brain and surrounding structures were obtained without intravenous contrast. COMPARISON:  Same day CT head FINDINGS: Brain: Redemonstrated right cerebellar hematoma, which is grossly unchanged in size measuring up to 2.1 cm. Negative for an acute infarct. There is vasogenic edema surrounding the cerebellar hemorrhage with minimal mass effect on the fourth ventricle. No evidence of hydrocephalus. No extra-axial fluid collection. There is sequela of mild overall  chronic microvascular ischemic change. Lack of IV contrast limits the ability to assess for an underlying lesion. Generalized volume loss. Vascular: Normal flow voids. Skull and upper cervical spine: Normal marrow signal. Sinuses/Orbits: No middle ear or mastoid effusion. Paranasal sinuses are clear. Orbits are unremarkable Other: None. IMPRESSION: Redemonstrated right cerebellar hematoma, which is grossly unchanged in size measuring up to 2.1 cm when compared to same day head CT. There is vasogenic edema surrounding the cerebellar hemorrhage with minimal mass effect on the fourth ventricle. No evidence of hydrocephalus. Electronically Signed   By: Lorenza Cambridge M.D.   On: 05/17/2023 19:46   DG CHEST PORT 1 VIEW  Result Date: 05/17/2023 CLINICAL DATA:  Respiratory abnormalities, intracranial hemorrhage EXAM: PORTABLE CHEST 1 VIEW COMPARISON:  05/16/2023 FINDINGS: Single frontal view of the chest demonstrates stable enlargement of the cardiac silhouette. Chronic interstitial prominence compatible with scarring. No acute airspace disease, effusion, or pneumothorax. No acute bony abnormalities. IMPRESSION: 1. Stable chest, no acute process. Electronically Signed   By: Sharlet Salina M.D.   On: 05/17/2023 19:31   ECHOCARDIOGRAM COMPLETE  Result Date: 05/17/2023    ECHOCARDIOGRAM REPORT   Patient Name:   Emily Jennings Date of Exam: 05/17/2023 Medical Rec #:  [id number]     Height:       60.0 in Accession #:    [id number]    Weight:       122.1 lb Date of Birth:  1934-02-16      BSA:          1.514 m Patient Age:    89 years      BP:           134/67 mmHg Patient Gender: F             HR:           78 bpm. Exam Location:  Inpatient Procedure: 2D Echo, Color Doppler and Cardiac Doppler Indications:    Stroke i63.9 (hemorrhagic)  History:        Patient has prior history of Echocardiogram examinations, most                 recent 09/13/2022. CHF; Risk Factors:Hypertension, Diabetes and                 Dyslipidemia.   Sonographer:    Irving Burton Senior RDCS Referring Phys: [phone] MCNEILL P KIRKPATRICK IMPRESSIONS  1. Left ventricular ejection fraction, by estimation, is 65 to 70%. The left ventricle has normal function. The left  Physician Discharge Summary  Emily Jennings WUJ:[id number] DOB: 06-18-1934 DOA: 06/01/2023  PCP: [name], MD Nephrologist: Dr. Wolfgang Phoenix  Admit date: 06/01/2023 Discharge date: 06/04/2023  Admitted From:  Home with HH  Disposition: Home with [facility]   Recommendations for Outpatient Follow-up:  Follow up with PCP as scheduled on 06/06/23  Please recheck labs to follow up renal function and electrolytes  Please follow up with Dr. Wolfgang Phoenix in 1 week PLEASE MONITOR BLOOD SUGAR AND ADJUST THERAPY AS NEEDED  Home Health:  PT/OT   Discharge Condition: STABLE   CODE STATUS: FULL DIET: renal low sodium    Brief Hospitalization Summary: Please see all hospital notes, images, labs for full details of the hospitalization. ADMISSION PROVIDER HPI:  89 y.o. female with medical history significant of right cerebellar intraparenchymal hemorrhage due to hypertension, CKD stage III, type 2 diabetes mellitus, diastolic CHF, and more presents the ED with a chief complaint of swelling in her legs and shortness of breath.  Unfortunately, patient is not able to give me much meaningful history.  She is very somnolent, and repeats to me that she is sleeping several times.  When asked why she came into the hospital she started talking about her brain bleed from the last admission.  When I reminded her that she had told other people that her legs were swollen she then said yes she had peripheral edema and shortness of breath.  After several more times of her telling me she is sleeping I decided more meaningful history might be obtained during dayshift.  Her last admission was discharged on September 9.  That was the admission for the intraparenchymal hemorrhage due to hypertension.  She was treated with hypertonic saline and her cerebellar edema improved.  Aspirin was contraindicated due to the hemorrhage.  Her creatinine was quite high at the last admission as well.  She was advised to follow-up with nephrology for  diuretic adjustment.  She had had some diuresis during that admission.  No further history obtained at this time.   HOSPITAL COURSE BY PROBLEM LISTING   Acute HFpEF -pt has diuresed well after receiving IV furosemide dose -pt reports feeling better, breathing better and leg edema has improved   -discharging on lasix 40 mg every other day with close outpatient follow up with her PCP and with nephrologist    Filed Weights   06/02/23 0500 06/03/23 0441 06/04/23 0535  Weight: 59 kg 58.1 kg 58.5 kg   Hyperlipidemia -Continue statin   (CKD) stage G3b--->PROGRESSED TO STAGE 4 CKD  - Pt strongly advised to follow up with her nephrologist Dr. Wolfgang Phoenix in next week to have labs rechecked and discuss management options as she is rapidly progressing to end stage renal disease.   - pt says she would like to discuss with her trusted nephrologist     Hyponatremia -hypervolemic hyponatremia -Na 127  -->129  -improved with diuresis    Uncontrolled Type 2 diabetes mellitus with stage 3a chronic kidney disease, with long-term current use of insulin with hyperglycemia  -5 units of long acting insulin at baseline -glargine 8 units ordered, increased novolog to 5 units TID with meals plus SSI - pt to follow up with her PCP to discuss glycemic control management   Resistant hypertension -continue coreg, hydralazine, procardia -continue spironolactone -hydrochlorothiazide was stopped at the last admission    Discharge Diagnoses:  Principal Problem:   Acute diastolic CHF (congestive heart failure) (HCC) Active Problems:   Resistant hypertension   Type 2  microvascular disease. Vascular: Atherosclerotic calcification of the vertebral and internal carotid arteries at the skull base. No abnormal hyperdensity of the major intracranial arteries or dural venous sinuses. Skull: The visualized skull base, calvarium and extracranial soft tissues are normal. Sinuses/Orbits: Right sphenoid sinus opacification. The orbits are normal. IMPRESSION: Unchanged appearance of intraparenchymal hematoma in the right cerebellum. Electronically Signed   By: Deatra Robinson M.D.   On: 05/17/2023 23:32   MR BRAIN WO CONTRAST  Result Date: 05/17/2023 CLINICAL DATA:  Stroke, hemorrhagic EXAM: MRI HEAD WITHOUT CONTRAST TECHNIQUE: Multiplanar, multiecho pulse sequences of the brain and surrounding structures were obtained without intravenous contrast. COMPARISON:  Same day CT head FINDINGS: Brain: Redemonstrated right cerebellar hematoma, which is grossly unchanged in size measuring up to 2.1 cm. Negative for an acute infarct. There is vasogenic edema surrounding the cerebellar hemorrhage with minimal mass effect on the fourth ventricle. No evidence of hydrocephalus. No extra-axial fluid collection. There is sequela of mild overall  chronic microvascular ischemic change. Lack of IV contrast limits the ability to assess for an underlying lesion. Generalized volume loss. Vascular: Normal flow voids. Skull and upper cervical spine: Normal marrow signal. Sinuses/Orbits: No middle ear or mastoid effusion. Paranasal sinuses are clear. Orbits are unremarkable Other: None. IMPRESSION: Redemonstrated right cerebellar hematoma, which is grossly unchanged in size measuring up to 2.1 cm when compared to same day head CT. There is vasogenic edema surrounding the cerebellar hemorrhage with minimal mass effect on the fourth ventricle. No evidence of hydrocephalus. Electronically Signed   By: Lorenza Cambridge M.D.   On: 05/17/2023 19:46   DG CHEST PORT 1 VIEW  Result Date: 05/17/2023 CLINICAL DATA:  Respiratory abnormalities, intracranial hemorrhage EXAM: PORTABLE CHEST 1 VIEW COMPARISON:  05/16/2023 FINDINGS: Single frontal view of the chest demonstrates stable enlargement of the cardiac silhouette. Chronic interstitial prominence compatible with scarring. No acute airspace disease, effusion, or pneumothorax. No acute bony abnormalities. IMPRESSION: 1. Stable chest, no acute process. Electronically Signed   By: Sharlet Salina M.D.   On: 05/17/2023 19:31   ECHOCARDIOGRAM COMPLETE  Result Date: 05/17/2023    ECHOCARDIOGRAM REPORT   Patient Name:   Emily Jennings Date of Exam: 05/17/2023 Medical Rec #:  [id number]     Height:       60.0 in Accession #:    [id number]    Weight:       122.1 lb Date of Birth:  1934-02-16      BSA:          1.514 m Patient Age:    89 years      BP:           134/67 mmHg Patient Gender: F             HR:           78 bpm. Exam Location:  Inpatient Procedure: 2D Echo, Color Doppler and Cardiac Doppler Indications:    Stroke i63.9 (hemorrhagic)  History:        Patient has prior history of Echocardiogram examinations, most                 recent 09/13/2022. CHF; Risk Factors:Hypertension, Diabetes and                 Dyslipidemia.   Sonographer:    Irving Burton Senior RDCS Referring Phys: [phone] MCNEILL P KIRKPATRICK IMPRESSIONS  1. Left ventricular ejection fraction, by estimation, is 65 to 70%. The left ventricle has normal function. The left  Physician Discharge Summary  Emily Jennings WUJ:[id number] DOB: 06-18-1934 DOA: 06/01/2023  PCP: [name], MD Nephrologist: Dr. Wolfgang Phoenix  Admit date: 06/01/2023 Discharge date: 06/04/2023  Admitted From:  Home with HH  Disposition: Home with [facility]   Recommendations for Outpatient Follow-up:  Follow up with PCP as scheduled on 06/06/23  Please recheck labs to follow up renal function and electrolytes  Please follow up with Dr. Wolfgang Phoenix in 1 week PLEASE MONITOR BLOOD SUGAR AND ADJUST THERAPY AS NEEDED  Home Health:  PT/OT   Discharge Condition: STABLE   CODE STATUS: FULL DIET: renal low sodium    Brief Hospitalization Summary: Please see all hospital notes, images, labs for full details of the hospitalization. ADMISSION PROVIDER HPI:  89 y.o. female with medical history significant of right cerebellar intraparenchymal hemorrhage due to hypertension, CKD stage III, type 2 diabetes mellitus, diastolic CHF, and more presents the ED with a chief complaint of swelling in her legs and shortness of breath.  Unfortunately, patient is not able to give me much meaningful history.  She is very somnolent, and repeats to me that she is sleeping several times.  When asked why she came into the hospital she started talking about her brain bleed from the last admission.  When I reminded her that she had told other people that her legs were swollen she then said yes she had peripheral edema and shortness of breath.  After several more times of her telling me she is sleeping I decided more meaningful history might be obtained during dayshift.  Her last admission was discharged on September 9.  That was the admission for the intraparenchymal hemorrhage due to hypertension.  She was treated with hypertonic saline and her cerebellar edema improved.  Aspirin was contraindicated due to the hemorrhage.  Her creatinine was quite high at the last admission as well.  She was advised to follow-up with nephrology for  diuretic adjustment.  She had had some diuresis during that admission.  No further history obtained at this time.   HOSPITAL COURSE BY PROBLEM LISTING   Acute HFpEF -pt has diuresed well after receiving IV furosemide dose -pt reports feeling better, breathing better and leg edema has improved   -discharging on lasix 40 mg every other day with close outpatient follow up with her PCP and with nephrologist    Filed Weights   06/02/23 0500 06/03/23 0441 06/04/23 0535  Weight: 59 kg 58.1 kg 58.5 kg   Hyperlipidemia -Continue statin   (CKD) stage G3b--->PROGRESSED TO STAGE 4 CKD  - Pt strongly advised to follow up with her nephrologist Dr. Wolfgang Phoenix in next week to have labs rechecked and discuss management options as she is rapidly progressing to end stage renal disease.   - pt says she would like to discuss with her trusted nephrologist     Hyponatremia -hypervolemic hyponatremia -Na 127  -->129  -improved with diuresis    Uncontrolled Type 2 diabetes mellitus with stage 3a chronic kidney disease, with long-term current use of insulin with hyperglycemia  -5 units of long acting insulin at baseline -glargine 8 units ordered, increased novolog to 5 units TID with meals plus SSI - pt to follow up with her PCP to discuss glycemic control management   Resistant hypertension -continue coreg, hydralazine, procardia -continue spironolactone -hydrochlorothiazide was stopped at the last admission    Discharge Diagnoses:  Principal Problem:   Acute diastolic CHF (congestive heart failure) (HCC) Active Problems:   Resistant hypertension   Type 2  Physician Discharge Summary  Emily Jennings WUJ:[id number] DOB: 06-18-1934 DOA: 06/01/2023  PCP: [name], MD Nephrologist: Dr. Wolfgang Phoenix  Admit date: 06/01/2023 Discharge date: 06/04/2023  Admitted From:  Home with HH  Disposition: Home with [facility]   Recommendations for Outpatient Follow-up:  Follow up with PCP as scheduled on 06/06/23  Please recheck labs to follow up renal function and electrolytes  Please follow up with Dr. Wolfgang Phoenix in 1 week PLEASE MONITOR BLOOD SUGAR AND ADJUST THERAPY AS NEEDED  Home Health:  PT/OT   Discharge Condition: STABLE   CODE STATUS: FULL DIET: renal low sodium    Brief Hospitalization Summary: Please see all hospital notes, images, labs for full details of the hospitalization. ADMISSION PROVIDER HPI:  89 y.o. female with medical history significant of right cerebellar intraparenchymal hemorrhage due to hypertension, CKD stage III, type 2 diabetes mellitus, diastolic CHF, and more presents the ED with a chief complaint of swelling in her legs and shortness of breath.  Unfortunately, patient is not able to give me much meaningful history.  She is very somnolent, and repeats to me that she is sleeping several times.  When asked why she came into the hospital she started talking about her brain bleed from the last admission.  When I reminded her that she had told other people that her legs were swollen she then said yes she had peripheral edema and shortness of breath.  After several more times of her telling me she is sleeping I decided more meaningful history might be obtained during dayshift.  Her last admission was discharged on September 9.  That was the admission for the intraparenchymal hemorrhage due to hypertension.  She was treated with hypertonic saline and her cerebellar edema improved.  Aspirin was contraindicated due to the hemorrhage.  Her creatinine was quite high at the last admission as well.  She was advised to follow-up with nephrology for  diuretic adjustment.  She had had some diuresis during that admission.  No further history obtained at this time.   HOSPITAL COURSE BY PROBLEM LISTING   Acute HFpEF -pt has diuresed well after receiving IV furosemide dose -pt reports feeling better, breathing better and leg edema has improved   -discharging on lasix 40 mg every other day with close outpatient follow up with her PCP and with nephrologist    Filed Weights   06/02/23 0500 06/03/23 0441 06/04/23 0535  Weight: 59 kg 58.1 kg 58.5 kg   Hyperlipidemia -Continue statin   (CKD) stage G3b--->PROGRESSED TO STAGE 4 CKD  - Pt strongly advised to follow up with her nephrologist Dr. Wolfgang Phoenix in next week to have labs rechecked and discuss management options as she is rapidly progressing to end stage renal disease.   - pt says she would like to discuss with her trusted nephrologist     Hyponatremia -hypervolemic hyponatremia -Na 127  -->129  -improved with diuresis    Uncontrolled Type 2 diabetes mellitus with stage 3a chronic kidney disease, with long-term current use of insulin with hyperglycemia  -5 units of long acting insulin at baseline -glargine 8 units ordered, increased novolog to 5 units TID with meals plus SSI - pt to follow up with her PCP to discuss glycemic control management   Resistant hypertension -continue coreg, hydralazine, procardia -continue spironolactone -hydrochlorothiazide was stopped at the last admission    Discharge Diagnoses:  Principal Problem:   Acute diastolic CHF (congestive heart failure) (HCC) Active Problems:   Resistant hypertension   Type 2  microvascular disease. Vascular: Atherosclerotic calcification of the vertebral and internal carotid arteries at the skull base. No abnormal hyperdensity of the major intracranial arteries or dural venous sinuses. Skull: The visualized skull base, calvarium and extracranial soft tissues are normal. Sinuses/Orbits: Right sphenoid sinus opacification. The orbits are normal. IMPRESSION: Unchanged appearance of intraparenchymal hematoma in the right cerebellum. Electronically Signed   By: Deatra Robinson M.D.   On: 05/17/2023 23:32   MR BRAIN WO CONTRAST  Result Date: 05/17/2023 CLINICAL DATA:  Stroke, hemorrhagic EXAM: MRI HEAD WITHOUT CONTRAST TECHNIQUE: Multiplanar, multiecho pulse sequences of the brain and surrounding structures were obtained without intravenous contrast. COMPARISON:  Same day CT head FINDINGS: Brain: Redemonstrated right cerebellar hematoma, which is grossly unchanged in size measuring up to 2.1 cm. Negative for an acute infarct. There is vasogenic edema surrounding the cerebellar hemorrhage with minimal mass effect on the fourth ventricle. No evidence of hydrocephalus. No extra-axial fluid collection. There is sequela of mild overall  chronic microvascular ischemic change. Lack of IV contrast limits the ability to assess for an underlying lesion. Generalized volume loss. Vascular: Normal flow voids. Skull and upper cervical spine: Normal marrow signal. Sinuses/Orbits: No middle ear or mastoid effusion. Paranasal sinuses are clear. Orbits are unremarkable Other: None. IMPRESSION: Redemonstrated right cerebellar hematoma, which is grossly unchanged in size measuring up to 2.1 cm when compared to same day head CT. There is vasogenic edema surrounding the cerebellar hemorrhage with minimal mass effect on the fourth ventricle. No evidence of hydrocephalus. Electronically Signed   By: Lorenza Cambridge M.D.   On: 05/17/2023 19:46   DG CHEST PORT 1 VIEW  Result Date: 05/17/2023 CLINICAL DATA:  Respiratory abnormalities, intracranial hemorrhage EXAM: PORTABLE CHEST 1 VIEW COMPARISON:  05/16/2023 FINDINGS: Single frontal view of the chest demonstrates stable enlargement of the cardiac silhouette. Chronic interstitial prominence compatible with scarring. No acute airspace disease, effusion, or pneumothorax. No acute bony abnormalities. IMPRESSION: 1. Stable chest, no acute process. Electronically Signed   By: Sharlet Salina M.D.   On: 05/17/2023 19:31   ECHOCARDIOGRAM COMPLETE  Result Date: 05/17/2023    ECHOCARDIOGRAM REPORT   Patient Name:   Emily Jennings Date of Exam: 05/17/2023 Medical Rec #:  [id number]     Height:       60.0 in Accession #:    [id number]    Weight:       122.1 lb Date of Birth:  1934-02-16      BSA:          1.514 m Patient Age:    89 years      BP:           134/67 mmHg Patient Gender: F             HR:           78 bpm. Exam Location:  Inpatient Procedure: 2D Echo, Color Doppler and Cardiac Doppler Indications:    Stroke i63.9 (hemorrhagic)  History:        Patient has prior history of Echocardiogram examinations, most                 recent 09/13/2022. CHF; Risk Factors:Hypertension, Diabetes and                 Dyslipidemia.   Sonographer:    Irving Burton Senior RDCS Referring Phys: [phone] MCNEILL P KIRKPATRICK IMPRESSIONS  1. Left ventricular ejection fraction, by estimation, is 65 to 70%. The left ventricle has normal function. The left  Physician Discharge Summary  Emily Jennings WUJ:[id number] DOB: 06-18-1934 DOA: 06/01/2023  PCP: [name], MD Nephrologist: Dr. Wolfgang Phoenix  Admit date: 06/01/2023 Discharge date: 06/04/2023  Admitted From:  Home with HH  Disposition: Home with [facility]   Recommendations for Outpatient Follow-up:  Follow up with PCP as scheduled on 06/06/23  Please recheck labs to follow up renal function and electrolytes  Please follow up with Dr. Wolfgang Phoenix in 1 week PLEASE MONITOR BLOOD SUGAR AND ADJUST THERAPY AS NEEDED  Home Health:  PT/OT   Discharge Condition: STABLE   CODE STATUS: FULL DIET: renal low sodium    Brief Hospitalization Summary: Please see all hospital notes, images, labs for full details of the hospitalization. ADMISSION PROVIDER HPI:  89 y.o. female with medical history significant of right cerebellar intraparenchymal hemorrhage due to hypertension, CKD stage III, type 2 diabetes mellitus, diastolic CHF, and more presents the ED with a chief complaint of swelling in her legs and shortness of breath.  Unfortunately, patient is not able to give me much meaningful history.  She is very somnolent, and repeats to me that she is sleeping several times.  When asked why she came into the hospital she started talking about her brain bleed from the last admission.  When I reminded her that she had told other people that her legs were swollen she then said yes she had peripheral edema and shortness of breath.  After several more times of her telling me she is sleeping I decided more meaningful history might be obtained during dayshift.  Her last admission was discharged on September 9.  That was the admission for the intraparenchymal hemorrhage due to hypertension.  She was treated with hypertonic saline and her cerebellar edema improved.  Aspirin was contraindicated due to the hemorrhage.  Her creatinine was quite high at the last admission as well.  She was advised to follow-up with nephrology for  diuretic adjustment.  She had had some diuresis during that admission.  No further history obtained at this time.   HOSPITAL COURSE BY PROBLEM LISTING   Acute HFpEF -pt has diuresed well after receiving IV furosemide dose -pt reports feeling better, breathing better and leg edema has improved   -discharging on lasix 40 mg every other day with close outpatient follow up with her PCP and with nephrologist    Filed Weights   06/02/23 0500 06/03/23 0441 06/04/23 0535  Weight: 59 kg 58.1 kg 58.5 kg   Hyperlipidemia -Continue statin   (CKD) stage G3b--->PROGRESSED TO STAGE 4 CKD  - Pt strongly advised to follow up with her nephrologist Dr. Wolfgang Phoenix in next week to have labs rechecked and discuss management options as she is rapidly progressing to end stage renal disease.   - pt says she would like to discuss with her trusted nephrologist     Hyponatremia -hypervolemic hyponatremia -Na 127  -->129  -improved with diuresis    Uncontrolled Type 2 diabetes mellitus with stage 3a chronic kidney disease, with long-term current use of insulin with hyperglycemia  -5 units of long acting insulin at baseline -glargine 8 units ordered, increased novolog to 5 units TID with meals plus SSI - pt to follow up with her PCP to discuss glycemic control management   Resistant hypertension -continue coreg, hydralazine, procardia -continue spironolactone -hydrochlorothiazide was stopped at the last admission    Discharge Diagnoses:  Principal Problem:   Acute diastolic CHF (congestive heart failure) (HCC) Active Problems:   Resistant hypertension   Type 2  microvascular disease. Vascular: Atherosclerotic calcification of the vertebral and internal carotid arteries at the skull base. No abnormal hyperdensity of the major intracranial arteries or dural venous sinuses. Skull: The visualized skull base, calvarium and extracranial soft tissues are normal. Sinuses/Orbits: Right sphenoid sinus opacification. The orbits are normal. IMPRESSION: Unchanged appearance of intraparenchymal hematoma in the right cerebellum. Electronically Signed   By: Deatra Robinson M.D.   On: 05/17/2023 23:32   MR BRAIN WO CONTRAST  Result Date: 05/17/2023 CLINICAL DATA:  Stroke, hemorrhagic EXAM: MRI HEAD WITHOUT CONTRAST TECHNIQUE: Multiplanar, multiecho pulse sequences of the brain and surrounding structures were obtained without intravenous contrast. COMPARISON:  Same day CT head FINDINGS: Brain: Redemonstrated right cerebellar hematoma, which is grossly unchanged in size measuring up to 2.1 cm. Negative for an acute infarct. There is vasogenic edema surrounding the cerebellar hemorrhage with minimal mass effect on the fourth ventricle. No evidence of hydrocephalus. No extra-axial fluid collection. There is sequela of mild overall  chronic microvascular ischemic change. Lack of IV contrast limits the ability to assess for an underlying lesion. Generalized volume loss. Vascular: Normal flow voids. Skull and upper cervical spine: Normal marrow signal. Sinuses/Orbits: No middle ear or mastoid effusion. Paranasal sinuses are clear. Orbits are unremarkable Other: None. IMPRESSION: Redemonstrated right cerebellar hematoma, which is grossly unchanged in size measuring up to 2.1 cm when compared to same day head CT. There is vasogenic edema surrounding the cerebellar hemorrhage with minimal mass effect on the fourth ventricle. No evidence of hydrocephalus. Electronically Signed   By: Lorenza Cambridge M.D.   On: 05/17/2023 19:46   DG CHEST PORT 1 VIEW  Result Date: 05/17/2023 CLINICAL DATA:  Respiratory abnormalities, intracranial hemorrhage EXAM: PORTABLE CHEST 1 VIEW COMPARISON:  05/16/2023 FINDINGS: Single frontal view of the chest demonstrates stable enlargement of the cardiac silhouette. Chronic interstitial prominence compatible with scarring. No acute airspace disease, effusion, or pneumothorax. No acute bony abnormalities. IMPRESSION: 1. Stable chest, no acute process. Electronically Signed   By: Sharlet Salina M.D.   On: 05/17/2023 19:31   ECHOCARDIOGRAM COMPLETE  Result Date: 05/17/2023    ECHOCARDIOGRAM REPORT   Patient Name:   Emily Jennings Date of Exam: 05/17/2023 Medical Rec #:  [id number]     Height:       60.0 in Accession #:    [id number]    Weight:       122.1 lb Date of Birth:  1934-02-16      BSA:          1.514 m Patient Age:    89 years      BP:           134/67 mmHg Patient Gender: F             HR:           78 bpm. Exam Location:  Inpatient Procedure: 2D Echo, Color Doppler and Cardiac Doppler Indications:    Stroke i63.9 (hemorrhagic)  History:        Patient has prior history of Echocardiogram examinations, most                 recent 09/13/2022. CHF; Risk Factors:Hypertension, Diabetes and                 Dyslipidemia.   Sonographer:    Irving Burton Senior RDCS Referring Phys: [phone] MCNEILL P KIRKPATRICK IMPRESSIONS  1. Left ventricular ejection fraction, by estimation, is 65 to 70%. The left ventricle has normal function. The left  microvascular disease. Vascular: Atherosclerotic calcification of the vertebral and internal carotid arteries at the skull base. No abnormal hyperdensity of the major intracranial arteries or dural venous sinuses. Skull: The visualized skull base, calvarium and extracranial soft tissues are normal. Sinuses/Orbits: Right sphenoid sinus opacification. The orbits are normal. IMPRESSION: Unchanged appearance of intraparenchymal hematoma in the right cerebellum. Electronically Signed   By: Deatra Robinson M.D.   On: 05/17/2023 23:32   MR BRAIN WO CONTRAST  Result Date: 05/17/2023 CLINICAL DATA:  Stroke, hemorrhagic EXAM: MRI HEAD WITHOUT CONTRAST TECHNIQUE: Multiplanar, multiecho pulse sequences of the brain and surrounding structures were obtained without intravenous contrast. COMPARISON:  Same day CT head FINDINGS: Brain: Redemonstrated right cerebellar hematoma, which is grossly unchanged in size measuring up to 2.1 cm. Negative for an acute infarct. There is vasogenic edema surrounding the cerebellar hemorrhage with minimal mass effect on the fourth ventricle. No evidence of hydrocephalus. No extra-axial fluid collection. There is sequela of mild overall  chronic microvascular ischemic change. Lack of IV contrast limits the ability to assess for an underlying lesion. Generalized volume loss. Vascular: Normal flow voids. Skull and upper cervical spine: Normal marrow signal. Sinuses/Orbits: No middle ear or mastoid effusion. Paranasal sinuses are clear. Orbits are unremarkable Other: None. IMPRESSION: Redemonstrated right cerebellar hematoma, which is grossly unchanged in size measuring up to 2.1 cm when compared to same day head CT. There is vasogenic edema surrounding the cerebellar hemorrhage with minimal mass effect on the fourth ventricle. No evidence of hydrocephalus. Electronically Signed   By: Lorenza Cambridge M.D.   On: 05/17/2023 19:46   DG CHEST PORT 1 VIEW  Result Date: 05/17/2023 CLINICAL DATA:  Respiratory abnormalities, intracranial hemorrhage EXAM: PORTABLE CHEST 1 VIEW COMPARISON:  05/16/2023 FINDINGS: Single frontal view of the chest demonstrates stable enlargement of the cardiac silhouette. Chronic interstitial prominence compatible with scarring. No acute airspace disease, effusion, or pneumothorax. No acute bony abnormalities. IMPRESSION: 1. Stable chest, no acute process. Electronically Signed   By: Sharlet Salina M.D.   On: 05/17/2023 19:31   ECHOCARDIOGRAM COMPLETE  Result Date: 05/17/2023    ECHOCARDIOGRAM REPORT   Patient Name:   Emily Jennings Date of Exam: 05/17/2023 Medical Rec #:  [id number]     Height:       60.0 in Accession #:    [id number]    Weight:       122.1 lb Date of Birth:  1934-02-16      BSA:          1.514 m Patient Age:    89 years      BP:           134/67 mmHg Patient Gender: F             HR:           78 bpm. Exam Location:  Inpatient Procedure: 2D Echo, Color Doppler and Cardiac Doppler Indications:    Stroke i63.9 (hemorrhagic)  History:        Patient has prior history of Echocardiogram examinations, most                 recent 09/13/2022. CHF; Risk Factors:Hypertension, Diabetes and                 Dyslipidemia.   Sonographer:    Irving Burton Senior RDCS Referring Phys: [phone] MCNEILL P KIRKPATRICK IMPRESSIONS  1. Left ventricular ejection fraction, by estimation, is 65 to 70%. The left ventricle has normal function. The left  Physician Discharge Summary  Emily Jennings WUJ:[id number] DOB: 06-18-1934 DOA: 06/01/2023  PCP: [name], MD Nephrologist: Dr. Wolfgang Phoenix  Admit date: 06/01/2023 Discharge date: 06/04/2023  Admitted From:  Home with HH  Disposition: Home with [facility]   Recommendations for Outpatient Follow-up:  Follow up with PCP as scheduled on 06/06/23  Please recheck labs to follow up renal function and electrolytes  Please follow up with Dr. Wolfgang Phoenix in 1 week PLEASE MONITOR BLOOD SUGAR AND ADJUST THERAPY AS NEEDED  Home Health:  PT/OT   Discharge Condition: STABLE   CODE STATUS: FULL DIET: renal low sodium    Brief Hospitalization Summary: Please see all hospital notes, images, labs for full details of the hospitalization. ADMISSION PROVIDER HPI:  89 y.o. female with medical history significant of right cerebellar intraparenchymal hemorrhage due to hypertension, CKD stage III, type 2 diabetes mellitus, diastolic CHF, and more presents the ED with a chief complaint of swelling in her legs and shortness of breath.  Unfortunately, patient is not able to give me much meaningful history.  She is very somnolent, and repeats to me that she is sleeping several times.  When asked why she came into the hospital she started talking about her brain bleed from the last admission.  When I reminded her that she had told other people that her legs were swollen she then said yes she had peripheral edema and shortness of breath.  After several more times of her telling me she is sleeping I decided more meaningful history might be obtained during dayshift.  Her last admission was discharged on September 9.  That was the admission for the intraparenchymal hemorrhage due to hypertension.  She was treated with hypertonic saline and her cerebellar edema improved.  Aspirin was contraindicated due to the hemorrhage.  Her creatinine was quite high at the last admission as well.  She was advised to follow-up with nephrology for  diuretic adjustment.  She had had some diuresis during that admission.  No further history obtained at this time.   HOSPITAL COURSE BY PROBLEM LISTING   Acute HFpEF -pt has diuresed well after receiving IV furosemide dose -pt reports feeling better, breathing better and leg edema has improved   -discharging on lasix 40 mg every other day with close outpatient follow up with her PCP and with nephrologist    Filed Weights   06/02/23 0500 06/03/23 0441 06/04/23 0535  Weight: 59 kg 58.1 kg 58.5 kg   Hyperlipidemia -Continue statin   (CKD) stage G3b--->PROGRESSED TO STAGE 4 CKD  - Pt strongly advised to follow up with her nephrologist Dr. Wolfgang Phoenix in next week to have labs rechecked and discuss management options as she is rapidly progressing to end stage renal disease.   - pt says she would like to discuss with her trusted nephrologist     Hyponatremia -hypervolemic hyponatremia -Na 127  -->129  -improved with diuresis    Uncontrolled Type 2 diabetes mellitus with stage 3a chronic kidney disease, with long-term current use of insulin with hyperglycemia  -5 units of long acting insulin at baseline -glargine 8 units ordered, increased novolog to 5 units TID with meals plus SSI - pt to follow up with her PCP to discuss glycemic control management   Resistant hypertension -continue coreg, hydralazine, procardia -continue spironolactone -hydrochlorothiazide was stopped at the last admission    Discharge Diagnoses:  Principal Problem:   Acute diastolic CHF (congestive heart failure) (HCC) Active Problems:   Resistant hypertension   Type 2

## 2023-06-04 NOTE — Plan of Care (Signed)
  Problem: Self-Care: Goal: Ability to participate in self-care as condition permits will improve Outcome: Progressing Goal: Verbalization of feelings and concerns over difficulty with self-care will improve Outcome: Progressing Goal: Ability to communicate needs accurately will improve Outcome: Progressing   Problem: Education: Goal: Knowledge of General Education information will improve Description: Including pain rating scale, medication(s)/side effects and non-pharmacologic comfort measures Outcome: Progressing   Problem: Activity: Goal: Risk for activity intolerance will decrease Outcome: Progressing

## 2023-06-04 NOTE — Consult Note (Signed)
[facility] Inpatient Consult   06/04/2023  Emily Jennings [birth year] [id number]  Primary Care Provider:  Dr. [name]  ([facility])  Patient is currently active with Care Management for chronic disease management services.  Patient has been engaged by a Charity fundraiser.  Our community based plan of care has focused on disease management and community resource support.   Patient will receive a post hospital call and will be evaluated for assessments and disease process education.   Plan: Pt will discharge with HHealth services  Inpatient Transition Of Care [TOC] team member to make aware that Care Management following.  Of note, Care Management services does not replace or interfere with any services that are needed or arranged by inpatient [facility] care management team.   For additional questions or referrals please contact:  Emily Cousin, RN, [facility] Liaison    Population Health Office Hours MTWF  8:00 am-6:00 pm [phone] mobile [phone] [Office toll free line] Office Hours are M-F 8:30 - 5 pm [email or web address]

## 2023-06-04 NOTE — Telephone Encounter (Signed)
Pt aware of appt change

## 2023-06-04 NOTE — Discharge Instructions (Addendum)
PLEASE FOLLOW UP WITH YOUR NEPHROLOGIST DR. Wolfgang Phoenix IN 1 WEEK TO HAVE LABS RECHECKED   IMPORTANT INFORMATION: PAY CLOSE ATTENTION   PHYSICIAN DISCHARGE INSTRUCTIONS  Follow with Primary care provider  [name], MD  and other consultants as instructed by your Hospitalist Physician  SEEK MEDICAL CARE OR RETURN TO EMERGENCY ROOM IF SYMPTOMS COME BACK, WORSEN OR NEW PROBLEM DEVELOPS   Please note: You were cared for by a hospitalist during your hospital stay. Every effort will be made to forward records to your primary care provider.  You can request that your primary care provider send for your hospital records if they have not received them.  Once you are discharged, your primary care physician will handle any further medical issues. Please note that NO REFILLS for any discharge medications will be authorized once you are discharged, as it is imperative that you return to your primary care physician (or establish a relationship with a primary care physician if you do not have one) for your post hospital discharge needs so that they can reassess your need for medications and monitor your lab values.  Please get a complete blood count and chemistry panel checked by your Primary MD at your next visit, and again as instructed by your Primary MD.  Get Medicines reviewed and adjusted: Please take all your medications with you for your next visit with your Primary MD  Laboratory/radiological data: Please request your Primary MD to go over all hospital tests and procedure/radiological results at the follow up, please ask your primary care provider to get all Hospital records sent to his/her office.  In some cases, they will be blood work, cultures and biopsy results pending at the time of your discharge. Please request that your primary care provider follow up on these results.  If you are diabetic, please bring your blood sugar readings with you to your follow up appointment with primary  care.    Please call and make your follow up appointments as soon as possible.    Also Note the following: If you experience worsening of your admission symptoms, develop shortness of breath, life threatening emergency, suicidal or homicidal thoughts you must seek medical attention immediately by calling 911 or calling your MD immediately  if symptoms less severe.  You must read complete instructions/literature along with all the possible adverse reactions/side effects for all the Medicines you take and that have been prescribed to you. Take any new Medicines after you have completely understood and accpet all the possible adverse reactions/side effects.   Do not drive when taking Pain medications or sleeping medications (Benzodiazepines)  Do not take more than prescribed Pain, Sleep and Anxiety Medications. It is not advisable to combine anxiety,sleep and pain medications without talking with your primary care practitioner  Special Instructions: If you have smoked or chewed Tobacco  in the last 2 yrs please stop smoking, stop any regular Alcohol  and or any Recreational drug use.  Wear Seat belts while driving.  Do not drive if taking any narcotic, mind altering or controlled substances or recreational drugs or alcohol.     ]

## 2023-06-04 NOTE — TOC Transition Note (Signed)
Transition of Care [facility]) - CM/SW Discharge Note   Patient Details  Name: Emily Jennings MRN: [id number] Date of Birth: 10-23-1933  Transition of Care [facility]) CM/SW Contact:  Villa Herb, LCSWA Phone Number: 06/04/2023, 11:29 AM  Clinical Narrative:    CSW confirmed with Kandee Keen with [facility] that pt is active with them for PT/OT services. CSW requested that MD place orders. CSW updated Kandee Keen that pt will likely D/C today. TOC signing off.   Final next level of care: Home w Home Health Services Barriers to Discharge: Barriers Resolved   Patient Goals and CMS Choice CMS Medicare.gov Compare Post Acute Care list provided to:: Patient Choice offered to / list presented to : Patient  Discharge Placement                         Discharge Plan and Services Additional resources added to the After Visit Summary for   In-house Referral: Clinical Social Work Discharge Planning Services: CM Consult Post Acute Care Choice: Home Health                    HH Arranged: PT, OT [facility] Agency: [facility] Date [facility] Agency Contacted: 06/04/23   Representative spoke with at [facility] Agency: Kandee Keen  Social Determinants of Health (SDOH) Interventions SDOH Screenings   Food Insecurity: No Food Insecurity (06/02/2023)  Housing: Low Risk  (06/02/2023)  Transportation Needs: No Transportation Needs (06/02/2023)  Utilities: Not At Risk (06/02/2023)  Alcohol Screen: Low Risk  (11/15/2022)  Depression (PHQ2-9): Low Risk  (05/16/2023)  Financial Resource Strain: Low Risk  (12/28/2022)  Physical Activity: Sufficiently Active (11/15/2022)  Social Connections: Moderately Integrated (11/15/2022)  Stress: No Stress Concern Present (12/28/2022)  Tobacco Use: Low Risk  (06/01/2023)     Readmission Risk Interventions    06/03/2023   11:29 AM 02/19/2023    8:06 AM  Readmission Risk Prevention Plan  Transportation Screening Complete Complete  HRI or Home Care Consult Complete Complete  Social Work Consult  for Recovery Care Planning/Counseling Complete Complete  Palliative Care Screening Not Applicable Not Applicable  Medication Review Oceanographer) Complete Complete

## 2023-06-04 NOTE — Progress Notes (Signed)
Mobility Specialist Progress Note:    06/04/23 1200  Mobility  Activity Ambulated with assistance in hallway  Level of Assistance Contact guard assist, steadying assist  Assistive Device Front wheel walker  Distance Ambulated (ft) 30 ft  Range of Motion/Exercises Active;All extremities  Activity Response Tolerated well  Mobility Referral Yes  $Mobility charge 1 Mobility  Mobility Specialist Start Time (ACUTE ONLY) 1200  Mobility Specialist Stop Time (ACUTE ONLY) 1210  Mobility Specialist Time Calculation (min) (ACUTE ONLY) 10 min   Pt received attempting to ambulate in room with no assistance. Agreeable to further ambulation, required CGA with RW. Tolerated well, asx throughout. Returned pt to chair, all needs met.   Lawerance Bach Mobility Specialist Please contact via Special educational needs teacher or  Rehab office at [phone]

## 2023-06-04 NOTE — Care Management Important Message (Signed)
Important Message  Patient Details  Name: Emily Jennings MRN: [id number] Date of Birth: November 07, 1933   Medicare Important Message Given:  Yes     Corey Harold 06/04/2023, 2:18 PM

## 2023-06-06 ENCOUNTER — Ambulatory Visit (INDEPENDENT_AMBULATORY_CARE_PROVIDER_SITE_OTHER): Payer: Medicare Other | Admitting: Family Medicine

## 2023-06-06 ENCOUNTER — Encounter: Payer: Self-pay | Admitting: Family Medicine

## 2023-06-06 ENCOUNTER — Telehealth: Payer: Self-pay

## 2023-06-06 VITALS — BP 130/82 | HR 74 | Ht 60.0 in | Wt 131.1 lb

## 2023-06-06 DIAGNOSIS — I1A0 Resistant hypertension: Secondary | ICD-10-CM | POA: Diagnosis not present

## 2023-06-06 DIAGNOSIS — N1831 Chronic kidney disease, stage 3a: Secondary | ICD-10-CM | POA: Diagnosis not present

## 2023-06-06 DIAGNOSIS — E785 Hyperlipidemia, unspecified: Secondary | ICD-10-CM

## 2023-06-06 DIAGNOSIS — Z794 Long term (current) use of insulin: Secondary | ICD-10-CM | POA: Diagnosis not present

## 2023-06-06 DIAGNOSIS — Z7689 Persons encountering health services in other specified circumstances: Secondary | ICD-10-CM | POA: Diagnosis not present

## 2023-06-06 DIAGNOSIS — R2681 Unsteadiness on feet: Secondary | ICD-10-CM

## 2023-06-06 DIAGNOSIS — G9341 Metabolic encephalopathy: Secondary | ICD-10-CM

## 2023-06-06 DIAGNOSIS — I5031 Acute diastolic (congestive) heart failure: Secondary | ICD-10-CM | POA: Diagnosis not present

## 2023-06-06 DIAGNOSIS — E1122 Type 2 diabetes mellitus with diabetic chronic kidney disease: Secondary | ICD-10-CM | POA: Diagnosis not present

## 2023-06-06 NOTE — Patient Instructions (Addendum)
F/U in October as before, call if you need me sooner  PLEASE take your 6 medications AS Directed only  Chem 7 and EGFr today  You ar e referred urgently to Cardiology for follow up  You need an appointment with Dr Wolfgang Phoenix in the next 1 week  Blood pressure and heart pills are nifedipine, carvedilol and hydralazine  Cholesterol pill is rosuvastatin  Fluid pills are furosemide and potassium which is every other day.  You are referred to [facility] case manager for help with medication and chronic disease management

## 2023-06-06 NOTE — Transitions of Care (Post Inpatient/ED Visit) (Signed)
06/06/2023  Name: TIELA FLUM MRN: [id number] DOB: 02/24/1934  Today's TOC FU Call Status: Today's TOC FU Call Status:: Unsuccessful Call (1st Attempt) Unsuccessful Call (1st Attempt) Date: 06/06/23  Attempted to reach the patient regarding the most recent Inpatient/ED visit.  Follow Up Plan: Additional outreach attempts will be made to reach the patient to complete the Transitions of Care (Post Inpatient/ED visit) call.     Antionette Fairy, RN,BSN,CCM [facility] Care Management Care Management Community Coordinator Direct Phone: [phone] Toll Free: [phone] Fax: [phone]

## 2023-06-07 ENCOUNTER — Telehealth: Payer: Self-pay

## 2023-06-07 LAB — BMP8+EGFR
BUN/Creatinine Ratio: 20 (ref 12–28)
BUN: 47 mg/dL — ABNORMAL HIGH (ref 8–27)
CO2: 27 mmol/L (ref 20–29)
Calcium: 8.9 mg/dL (ref 8.7–10.3)
Chloride: 94 mmol/L — ABNORMAL LOW (ref 96–106)
Creatinine, Ser: 2.3 mg/dL — ABNORMAL HIGH (ref 0.57–1.00)
Glucose: 94 mg/dL (ref 70–99)
Potassium: 3.7 mmol/L (ref 3.5–5.2)
Sodium: 137 mmol/L (ref 134–144)
eGFR: 20 mL/min/{1.73_m2} — ABNORMAL LOW (ref >59)

## 2023-06-07 NOTE — Transitions of Care (Post Inpatient/ED Visit) (Signed)
06/07/2023  Name: Emily Jennings MRN: [id number] DOB: [birth year]  Today's TOC FU Call Status: Today's TOC FU Call Status:: Unsuccessful Call (2nd Attempt) Unsuccessful Call (2nd Attempt) Date: 06/07/23  Attempted to reach the patient regarding the most recent Inpatient/ED visit.  Follow Up Plan: Additional outreach attempts will be made to reach the patient to complete the Transitions of Care (Post Inpatient/ED visit) call.     Antionette Fairy, RN,BSN,CCM [facility] Care Management Care Management Community Coordinator Direct Phone: [phone] Toll Free: [phone] Fax: [phone]

## 2023-06-08 ENCOUNTER — Encounter: Payer: Self-pay | Admitting: Family Medicine

## 2023-06-08 ENCOUNTER — Telehealth: Payer: Self-pay

## 2023-06-08 NOTE — Transitions of Care (Post Inpatient/ED Visit) (Signed)
06/08/2023  Name: Emily Jennings MRN: [id number] DOB: Feb 04, 1934  Today's TOC FU Call Status: Today's TOC FU Call Status:: Unsuccessful Call (3rd Attempt) Unsuccessful Call (3rd Attempt) Date: 06/08/23  Attempted to reach the patient regarding the most recent Inpatient/ED visit.  Follow Up Plan: No further outreach attempts will be made at this time. We have been unable to contact the patient.  Antionette Fairy, RN,BSN,CCM [facility] Care Management Care Management Community Coordinator Direct Phone: [phone] Toll Free: [phone] Fax: [phone]

## 2023-06-08 NOTE — Assessment & Plan Note (Signed)
Controlled on current regime , continue same

## 2023-06-08 NOTE — Assessment & Plan Note (Signed)
Resolved duroing hospitalization for cVA, back to baseline

## 2023-06-08 NOTE — Assessment & Plan Note (Signed)
Requires and uses cane for safe ambulation

## 2023-06-08 NOTE — Assessment & Plan Note (Signed)
Patient in for follow up of recent hospitalization. Discharge summary, and laboratory and radiology data are reviewed, and any questions or concerns  are discussed. Specific issues requiring follow up are specifically addressed.

## 2023-06-08 NOTE — Assessment & Plan Note (Signed)
Improved , following recent hospitalization, remain on low sodium fluid restriced diet, f/u wit cardiology

## 2023-06-08 NOTE — Assessment & Plan Note (Signed)
Hyperlipidemia:Low fat diet discussed and encouraged.   Lipid Panel  Lab Results  Component Value Date   CHOL 192 11/24/2021   HDL 118 11/24/2021   LDLCALC 65 11/24/2021   TRIG 66 05/18/2023   CHOLHDL 1.6 11/24/2021    Continue current med Controlled, no change in medication

## 2023-06-08 NOTE — Assessment & Plan Note (Signed)
Needs appt with nephrology asap,  trending towards dialysis, rept chem 7 , essentially unchanged since d/c 2 days ago

## 2023-06-08 NOTE — Progress Notes (Signed)
Emily Jennings     MRN: [id number]      DOB: 05-31-1934  Chief Complaint  Patient presents with   Follow-up    Follow up d/c 9/9 stroke    HPI Emily Jennings is here for follow up of recent hospitalizations x 2 and TOC visit Hospitalized from 8/28  to 05/28/2023 , dx stroke, HTN and AKI Re admitted  9/13 to 06/04/2023, with acute HFpEFand progressed from 3a toward stage 4 CKD, resistant hTN, type 2 DM and acute on chronic renal failure ROS Denies recent fever or chills. Denies sinus pressure, nasal congestion, ear pain or sore throat. Denies chest congestion, productive cough or wheezing. Denies chest pains, palpitations , still c/o leg edema , and SOB Denies abdominal pain, nausea, vomiting,diarrhea or constipation.   Denies dysuria, frequency, hesitancy or incontinence. Chronic  joint pain, and limitation in mobility. Denies headaches, seizures,  Denies depression, c/o mild anxiety . Denies skin break down or rash.   PE  BP 130/82   Pulse 74   Ht 5' (1.524 m)   Wt 131 lb 1.9 oz (59.5 kg)   SpO2 96%   BMI 25.61 kg/m   Patient alert and oriented and in no cardiopulmonary distress.  HEENT: No facial asymmetry, EOMI,     Neck decreased ROM.  Chest: Clear to auscultation bilaterally.  CVS: S1, S2 systolic  murmur, no S3.Regular rate.  ABD: Soft non tender.   Ext: one plus bilateral edema  MS: decreased  ROM spine, shoulders, hips and knees.  Skin: Intact, no ulcerations or rash noted.  Psych: Good eye contact, normal affect. Memory impaired  mildly  anxious not depressed appearing.  CNS: CN 2-12 intact,    Assessment & Plan  Acute diastolic CHF (congestive heart failure) (HCC) Improved , following recent hospitalization, remain on low sodium fluid restriced diet, f/u wit cardiology  Acute metabolic encephalopathy Resolved duroing hospitalization for cVA, back to baseline  CKD stage 4 due to type 2 diabetes mellitus (HCC) Needs appt with nephrology asap,   trending towards dialysis, rept chem 7 , essentially unchanged since d/c 2 days ago  Resistant hypertension Controlled on current regime, continue same  Type 2 diabetes mellitus with stage 3a chronic kidney disease, with long-term current use of insulin (HCC) Improved control, no med change Emily Jennings is reminded of the importance of commitment to daily physical activity for 30 minutes or more, as able and the need to limit carbohydrate intake to 30 to 60 grams per meal to help with blood sugar control.   The need to take medication as prescribed, test blood sugar as directed, and to call between visits if there is a concern that blood sugar is uncontrolled is also discussed.   Emily Jennings is reminded of the importance of daily foot exam, annual eye examination, and good blood sugar, blood pressure and cholesterol control.     Latest Ref Rng & Units 06/06/2023    3:21 PM 06/04/2023    4:50 AM 06/03/2023    5:40 AM 06/02/2023    5:00 AM 06/01/2023    6:56 PM  Diabetic Labs  Creatinine 0.57 - 1.00 mg/dL 4.09  8.11  9.14  7.82  2.08       06/06/2023    3:00 PM 06/06/2023    2:08 PM 06/04/2023    5:35 AM 06/04/2023    4:21 AM 06/03/2023    9:30 PM 06/03/2023    2:15 PM 06/03/2023    4:41 AM  BP/Weight  Systolic BP 130 113  158 154 114   Diastolic BP 82 52  57 63 50   Wt. (Lbs)  131.12 128.97    128.09  BMI  25.61 kg/m2 25.19 kg/m2    25.02 kg/m2      Latest Ref Rng & Units 11/10/2020    1:18 PM 11/10/2020   12:00 AM  Foot/eye exam completion dates  Eye Exam No Retinopathy No Retinopathy     No Retinopathy         This result is from an external source.        Unsteady gait when walking Requires and uses cane for safe ambulation  Hyperlipidemia LDL goal <100 Hyperlipidemia:Low fat diet discussed and encouraged.   Lipid Panel  Lab Results  Component Value Date   CHOL 192 11/24/2021   HDL 118 11/24/2021   LDLCALC 65 11/24/2021   TRIG 66 05/18/2023   CHOLHDL 1.6 11/24/2021     Continue current med Controlled, no change in medication    Encounter for support and coordination of transition of care Patient in for follow up of recent hospitalization. Discharge summary, and laboratory and radiology data are reviewed, and any questions or concerns  are discussed. Specific issues requiring follow up are specifically addressed.

## 2023-06-08 NOTE — Assessment & Plan Note (Signed)
Improved control, no med change Ms. Wilmer is reminded of the importance of commitment to daily physical activity for 30 minutes or more, as able and the need to limit carbohydrate intake to 30 to 60 grams per meal to help with blood sugar control.   The need to take medication as prescribed, test blood sugar as directed, and to call between visits if there is a concern that blood sugar is uncontrolled is also discussed.   Ms. Panda is reminded of the importance of daily foot exam, annual eye examination, and good blood sugar, blood pressure and cholesterol control.     Latest Ref Rng & Units 06/06/2023    3:21 PM 06/04/2023    4:50 AM 06/03/2023    5:40 AM 06/02/2023    5:00 AM 06/01/2023    6:56 PM  Diabetic Labs  Creatinine 0.57 - 1.00 mg/dL 4.54  0.98  1.19  1.47  2.08       06/06/2023    3:00 PM 06/06/2023    2:08 PM 06/04/2023    5:35 AM 06/04/2023    4:21 AM 06/03/2023    9:30 PM 06/03/2023    2:15 PM 06/03/2023    4:41 AM  BP/Weight  Systolic BP 130 113  158 154 114   Diastolic BP 82 52  57 63 50   Wt. (Lbs)  131.12 128.97    128.09  BMI  25.61 kg/m2 25.19 kg/m2    25.02 kg/m2      Latest Ref Rng & Units 11/10/2020    1:18 PM 11/10/2020   12:00 AM  Foot/eye exam completion dates  Eye Exam No Retinopathy No Retinopathy     No Retinopathy         This result is from an external source.

## 2023-06-09 ENCOUNTER — Encounter (HOSPITAL_COMMUNITY): Payer: Self-pay | Admitting: Emergency Medicine

## 2023-06-09 ENCOUNTER — Other Ambulatory Visit: Payer: Self-pay

## 2023-06-09 ENCOUNTER — Emergency Department (HOSPITAL_COMMUNITY): Payer: Medicare Other

## 2023-06-09 ENCOUNTER — Emergency Department (HOSPITAL_COMMUNITY)
Admission: EM | Admit: 2023-06-09 | Discharge: 2023-06-09 | Disposition: A | Discharge status: Home / Self Care | Payer: Medicare Other | Attending: Emergency Medicine | Admitting: Emergency Medicine

## 2023-06-09 DIAGNOSIS — E119 Type 2 diabetes mellitus without complications: Secondary | ICD-10-CM | POA: Insufficient documentation

## 2023-06-09 DIAGNOSIS — R0602 Shortness of breath: Secondary | ICD-10-CM | POA: Diagnosis not present

## 2023-06-09 DIAGNOSIS — Z794 Long term (current) use of insulin: Secondary | ICD-10-CM | POA: Insufficient documentation

## 2023-06-09 DIAGNOSIS — I509 Heart failure, unspecified: Secondary | ICD-10-CM | POA: Diagnosis not present

## 2023-06-09 DIAGNOSIS — J90 Pleural effusion, not elsewhere classified: Secondary | ICD-10-CM | POA: Diagnosis not present

## 2023-06-09 DIAGNOSIS — I2510 Atherosclerotic heart disease of native coronary artery without angina pectoris: Secondary | ICD-10-CM | POA: Insufficient documentation

## 2023-06-09 DIAGNOSIS — E876 Hypokalemia: Secondary | ICD-10-CM

## 2023-06-09 DIAGNOSIS — I517 Cardiomegaly: Secondary | ICD-10-CM | POA: Diagnosis not present

## 2023-06-09 DIAGNOSIS — I110 Hypertensive heart disease with heart failure: Secondary | ICD-10-CM | POA: Diagnosis not present

## 2023-06-09 DIAGNOSIS — I700 Atherosclerosis of aorta: Secondary | ICD-10-CM | POA: Diagnosis not present

## 2023-06-09 LAB — CBC WITH DIFFERENTIAL/PLATELET
Abs Immature Granulocytes: 0.01 10*3/uL (ref 0.00–0.07)
Basophils Absolute: 0 10*3/uL (ref 0.0–0.1)
Basophils Relative: 1 %
Eosinophils Absolute: 0.2 10*3/uL (ref 0.0–0.5)
Eosinophils Relative: 3 %
HCT: 29.1 % — ABNORMAL LOW (ref 36.0–46.0)
Hemoglobin: 9.5 g/dL — ABNORMAL LOW (ref 12.0–15.0)
Immature Granulocytes: 0 %
Lymphocytes Relative: 25 %
Lymphs Abs: 1.1 10*3/uL (ref 0.7–4.0)
MCH: 30.9 pg (ref 26.0–34.0)
MCHC: 32.6 g/dL (ref 30.0–36.0)
MCV: 94.8 fL (ref 80.0–100.0)
Monocytes Absolute: 0.7 10*3/uL (ref 0.1–1.0)
Monocytes Relative: 14 %
Neutro Abs: 2.6 10*3/uL (ref 1.7–7.7)
Neutrophils Relative %: 57 %
Platelets: 215 10*3/uL (ref 150–400)
RBC: 3.07 MIL/uL — ABNORMAL LOW (ref 3.87–5.11)
RDW: 13.8 % (ref 11.5–15.5)
WBC: 4.6 10*3/uL (ref 4.0–10.5)
nRBC: 0 % (ref 0.0–0.2)

## 2023-06-09 LAB — BASIC METABOLIC PANEL
Anion gap: 12 (ref 5–15)
BUN: 49 mg/dL — ABNORMAL HIGH (ref 8–23)
CO2: 27 mmol/L (ref 22–32)
Calcium: 8.2 mg/dL — ABNORMAL LOW (ref 8.9–10.3)
Chloride: 90 mmol/L — ABNORMAL LOW (ref 98–111)
Creatinine, Ser: 2.44 mg/dL — ABNORMAL HIGH (ref 0.44–1.00)
GFR, Estimated: 19 mL/min — ABNORMAL LOW (ref >60)
Glucose, Bld: 199 mg/dL — ABNORMAL HIGH (ref 70–99)
Potassium: 2.8 mmol/L — ABNORMAL LOW (ref 3.5–5.1)
Sodium: 129 mmol/L — ABNORMAL LOW (ref 135–145)

## 2023-06-09 LAB — BRAIN NATRIURETIC PEPTIDE: B Natriuretic Peptide: 503 pg/mL — ABNORMAL HIGH (ref 0.0–100.0)

## 2023-06-09 MED ORDER — FUROSEMIDE 10 MG/ML IJ SOLN
40.0000 mg | Freq: Once | INTRAMUSCULAR | Status: AC
  Administered 2023-06-09: 40 mg via INTRAVENOUS
  Filled 2023-06-09: qty 4

## 2023-06-09 MED ORDER — POTASSIUM CHLORIDE CRYS ER 20 MEQ PO TBCR
40.0000 meq | EXTENDED_RELEASE_TABLET | Freq: Once | ORAL | Status: AC
  Administered 2023-06-09: 40 meq via ORAL
  Filled 2023-06-09: qty 2

## 2023-06-09 MED ORDER — ALBUTEROL SULFATE HFA 108 (90 BASE) MCG/ACT IN AERS: 2.0000 | INHALATION_SPRAY | RESPIRATORY_TRACT | Status: DC | PRN

## 2023-06-09 NOTE — Discharge Instructions (Signed)
Start taking your Lasix daily instead of every other day.  Continue take your potassium supplements at home.  Make an appointment to follow-up with your primary care doctor to have your basic labs rechecked in the next few days.  Also make an appointment to follow back up with cardiology.  Return for any worsening shortness of breath or any new or worse symptoms.

## 2023-06-09 NOTE — ED Provider Notes (Signed)
[place] EMERGENCY DEPARTMENT AT [facility] Provider Note   CSN: [id number] Arrival date & time: 06/09/23  0400     History  Chief Complaint  Patient presents with   Shortness of Breath    Emily Jennings is a 89 y.o. female.  Patient is an 89 year old female with past medical history of prior cerebral hemorrhage, type 2 diabetes, coronary artery disease, GERD, anxiety, and recent admission for CHF/fluid retention.  Patient presents today for evaluation of shortness of breath and swelling in her legs.  She does not feel as though the Lasix is helping her urinate.  She denies any chest pain or productive cough.  No fevers or chills.  The history is provided by the patient.       Home Medications Prior to Admission medications   Medication Sig Start Date End Date Taking? Authorizing Provider  calcitRIOL (ROCALTROL) 0.25 MCG capsule Take 0.25 mcg by mouth 3 (three) times a week. Patient not taking: Reported on 06/06/2023    [provider]  carvedilol (COREG) 25 MG tablet Take 1 tablet (25 mg total) by mouth 2 (two) times daily with a meal. 05/28/23   Danford, Earl Lites, MD  cholecalciferol (VITAMIN D3) 25 MCG (1000 UNIT) tablet Take 1,000 Units by mouth daily.    [provider]  furosemide (LASIX) 40 MG tablet Take 1 tablet (40 mg total) by mouth every other day. 06/05/23 06/04/24  Johnson, Clanford L, MD  hydrALAZINE (APRESOLINE) 100 MG tablet Take 1 tablet (100 mg total) by mouth 3 (three) times daily. 05/28/23   Danford, Earl Lites, MD  insulin degludec (TRESIBA FLEXTOUCH) 100 UNIT/ML FlexTouch Pen Inject 5 Units into the skin daily. 05/01/23   Shamleffer, Konrad Dolores, MD  insulin lispro (HUMALOG KWIKPEN) 100 UNIT/ML KwikPen Inject 5 Units into the skin 3 (three) times daily. Max daily 30 units 05/01/23   Shamleffer, Konrad Dolores, MD  NIFEdipine (ADALAT CC) 60 MG 24 hr tablet Take 1 tablet (60 mg total) by mouth daily. 05/29/23   Danford,  Earl Lites, MD  potassium chloride (KLOR-CON M) 10 MEQ tablet Take 1 tablet (10 mEq total) by mouth every other day. Take only when taking furosemide 06/05/23   Johnson, Clanford L, MD  rosuvastatin (CRESTOR) 5 MG tablet TAKE 1 TABLET (5 MG TOTAL) BY MOUTH DAILY. Patient taking differently: Take 5 mg by mouth daily. 01/15/23   [name], MD  VITAMIN A-BETA CAROTENE PO Take 1 tablet by mouth daily.    [provider]      Allergies    Amlodipine, Benadryl [diphenhydramine hcl], Citalopram, Farxiga [dapagliflozin], Metformin and related, Tramadol, and Crestor [rosuvastatin]    Review of Systems   Review of Systems  All other systems reviewed and are negative.   Physical Exam Updated Vital Signs BP (!) 141/60 (BP Location: Left Arm)   Pulse 74   Temp 97.9 F (36.6 C) (Oral)   Resp 18   Ht 5' (1.524 m)   Wt 59 kg   SpO2 97%   BMI 25.40 kg/m  Physical Exam Vitals and nursing note reviewed.  Constitutional:      General: She is not in acute distress.    Appearance: She is well-developed. She is not diaphoretic.  HENT:     Head: Normocephalic and atraumatic.  Cardiovascular:     Rate and Rhythm: Normal rate and regular rhythm.     Heart sounds: No murmur heard.    No friction rub. No gallop.  Pulmonary:     Effort: Pulmonary effort is normal. No respiratory distress.     Breath sounds: Normal breath sounds. No wheezing.  Abdominal:     General: Bowel sounds are normal. There is no distension.     Palpations: Abdomen is soft.     Tenderness: There is no abdominal tenderness.  Musculoskeletal:        General: Normal range of motion.     Cervical back: Normal range of motion and neck supple.     Right lower leg: No tenderness. Edema present.     Left lower leg: No tenderness. Edema present.     Comments: There is 1+ pitting edema both lower extremities.  Skin:    General: Skin is warm and dry.  Neurological:     General: No focal deficit present.      Mental Status: She is alert and oriented to person, place, and time.     ED Results / Procedures / Treatments   Labs (all labs ordered are listed, but only abnormal results are displayed) Labs Reviewed  BASIC METABOLIC PANEL  CBC WITH DIFFERENTIAL/PLATELET  BRAIN NATRIURETIC PEPTIDE    EKG EKG Interpretation Date/Time:  Saturday June 09 2023 04:13:54 EDT Ventricular Rate:  79 PR Interval:  189 QRS Duration:  90 QT Interval:  404 QTC Calculation: 464 R Axis:   21  Text Interpretation: Sinus rhythm Probable left atrial enlargement Probable anteroseptal infarct, old Minimal ST elevation, inferior leads Confirmed by Geoffery Lyons ([id number]) on 06/09/2023 4:17:49 AM  Radiology DG Chest Portable 1 View  Result Date: 06/09/2023 CLINICAL DATA:  Shortness of breath EXAM: PORTABLE CHEST 1 VIEW COMPARISON:  06/01/2023 FINDINGS: Small pleural effusions greater on the left and stable. Chronic cardiomegaly and aortic atheromatous calcification. No edema or consolidation. No pneumothorax. Artifact from EKG leads. IMPRESSION: Cardiomegaly and small pleural effusions.  No change from prior. Electronically Signed   By: Tiburcio Pea M.D.   On: 06/09/2023 04:58    Procedures Procedures  {Document cardiac monitor, telemetry assessment procedure when appropriate:1}  Medications Ordered in ED Medications  albuterol (VENTOLIN HFA) 108 (90 Base) MCG/ACT inhaler 2 puff (has no administration in time range)  furosemide (LASIX) injection 40 mg (has no administration in time range)    ED Course/ Medical Decision Making/ A&P   {   Click here for ABCD2, HEART and other calculatorsREFRESH Note before signing :1}                              Medical Decision Making Amount and/or Complexity of Data Reviewed Labs: ordered. Radiology: ordered.  Risk Prescription drug management.   ***  {Document critical care time when appropriate:1} {Document review of labs and clinical decision tools ie  heart score, Chads2Vasc2 etc:1}  {Document your independent review of radiology images, and any outside records:1} {Document your discussion with family members, caretakers, and with consultants:1} {Document social determinants of health affecting pt's care:1} {Document your decision making why or why not admission, treatments were needed:1} Final Clinical Impression(s) / ED Diagnoses Final diagnoses:  None    Rx / DC Orders ED Discharge Orders     None

## 2023-06-09 NOTE — ED Triage Notes (Signed)
Pt with c/o SOB. Pt recently discharged from hospital.

## 2023-06-09 NOTE — ED Provider Notes (Addendum)
Patient able to ambulate to the bathroom without shortness of breath.  Patient also did diurese some with the Lasix.  Patient feeling better.  Patient followed by cardiology here in [place] Dr. Dina Rich.  Patient's BNP not significantly elevated here today.  Potassium was 2.8.  Patient given some oral potassium 40 mill equivalents.  Patient is taking potassium at home.  Patient's BNP is 500 but that is improved compared to where she is normally at.  Chest x-ray showed no significant congestive heart failure.  Patient's oxygen saturations at rest are 95%.  In addition patient's renal function not significantly worse than baseline.  Will have patient follow-up with cardiology primary care doctor to have electrolytes rechecked.  Start taking her Lasix daily instead of every other day.  And continue with her potassium supplements she has at home.   Vanetta Mulders, MD 06/09/23 1191    Vanetta Mulders, MD 06/09/23 [phone]

## 2023-06-09 NOTE — ED Notes (Signed)
Pt ambulated to bedside commode with assistance.

## 2023-06-13 ENCOUNTER — Ambulatory Visit: Payer: Medicare Other | Admitting: Nutrition

## 2023-06-14 ENCOUNTER — Other Ambulatory Visit: Payer: Self-pay

## 2023-06-14 ENCOUNTER — Ambulatory Visit (INDEPENDENT_AMBULATORY_CARE_PROVIDER_SITE_OTHER): Payer: Medicare Other | Admitting: Internal Medicine

## 2023-06-14 ENCOUNTER — Encounter: Payer: Self-pay | Admitting: Internal Medicine

## 2023-06-14 ENCOUNTER — Emergency Department (HOSPITAL_COMMUNITY): Payer: Medicare Other

## 2023-06-14 ENCOUNTER — Encounter (HOSPITAL_COMMUNITY): Payer: Self-pay | Admitting: *Deleted

## 2023-06-14 ENCOUNTER — Inpatient Hospital Stay: Payer: Medicare Other | Admitting: Internal Medicine

## 2023-06-14 ENCOUNTER — Inpatient Hospital Stay (HOSPITAL_COMMUNITY)
Admission: EM | Admit: 2023-06-14 | Discharge: 2023-06-18 | DRG: 291 | Disposition: A | Discharge status: Home Health | Payer: Medicare Other | Attending: Family Medicine | Admitting: Family Medicine

## 2023-06-14 VITALS — BP 119/49 | HR 74 | Ht 60.0 in

## 2023-06-14 DIAGNOSIS — Z79899 Other long term (current) drug therapy: Secondary | ICD-10-CM

## 2023-06-14 DIAGNOSIS — T383X5A Adverse effect of insulin and oral hypoglycemic [antidiabetic] drugs, initial encounter: Secondary | ICD-10-CM | POA: Diagnosis not present

## 2023-06-14 DIAGNOSIS — Z823 Family history of stroke: Secondary | ICD-10-CM

## 2023-06-14 DIAGNOSIS — I10 Essential (primary) hypertension: Secondary | ICD-10-CM | POA: Diagnosis not present

## 2023-06-14 DIAGNOSIS — I5033 Acute on chronic diastolic (congestive) heart failure: Secondary | ICD-10-CM | POA: Diagnosis not present

## 2023-06-14 DIAGNOSIS — N184 Chronic kidney disease, stage 4 (severe): Secondary | ICD-10-CM | POA: Diagnosis present

## 2023-06-14 DIAGNOSIS — R0600 Dyspnea, unspecified: Secondary | ICD-10-CM | POA: Diagnosis not present

## 2023-06-14 DIAGNOSIS — Z885 Allergy status to narcotic agent status: Secondary | ICD-10-CM

## 2023-06-14 DIAGNOSIS — N179 Acute kidney failure, unspecified: Secondary | ICD-10-CM | POA: Diagnosis present

## 2023-06-14 DIAGNOSIS — E876 Hypokalemia: Secondary | ICD-10-CM | POA: Diagnosis not present

## 2023-06-14 DIAGNOSIS — I130 Hypertensive heart and chronic kidney disease with heart failure and stage 1 through stage 4 chronic kidney disease, or unspecified chronic kidney disease: Principal | ICD-10-CM | POA: Diagnosis present

## 2023-06-14 DIAGNOSIS — Z9842 Cataract extraction status, left eye: Secondary | ICD-10-CM | POA: Diagnosis not present

## 2023-06-14 DIAGNOSIS — F411 Generalized anxiety disorder: Secondary | ICD-10-CM | POA: Diagnosis present

## 2023-06-14 DIAGNOSIS — Z800 Family history of malignant neoplasm of digestive organs: Secondary | ICD-10-CM

## 2023-06-14 DIAGNOSIS — Z833 Family history of diabetes mellitus: Secondary | ICD-10-CM

## 2023-06-14 DIAGNOSIS — M810 Age-related osteoporosis without current pathological fracture: Secondary | ICD-10-CM | POA: Diagnosis present

## 2023-06-14 DIAGNOSIS — Z961 Presence of intraocular lens: Secondary | ICD-10-CM | POA: Diagnosis not present

## 2023-06-14 DIAGNOSIS — N189 Chronic kidney disease, unspecified: Secondary | ICD-10-CM | POA: Diagnosis not present

## 2023-06-14 DIAGNOSIS — J90 Pleural effusion, not elsewhere classified: Secondary | ICD-10-CM | POA: Diagnosis not present

## 2023-06-14 DIAGNOSIS — I2510 Atherosclerotic heart disease of native coronary artery without angina pectoris: Secondary | ICD-10-CM | POA: Diagnosis present

## 2023-06-14 DIAGNOSIS — Z825 Family history of asthma and other chronic lower respiratory diseases: Secondary | ICD-10-CM

## 2023-06-14 DIAGNOSIS — Z888 Allergy status to other drugs, medicaments and biological substances status: Secondary | ICD-10-CM | POA: Diagnosis not present

## 2023-06-14 DIAGNOSIS — E871 Hypo-osmolality and hyponatremia: Principal | ICD-10-CM | POA: Diagnosis present

## 2023-06-14 DIAGNOSIS — N1831 Chronic kidney disease, stage 3a: Secondary | ICD-10-CM | POA: Diagnosis not present

## 2023-06-14 DIAGNOSIS — Z794 Long term (current) use of insulin: Secondary | ICD-10-CM

## 2023-06-14 DIAGNOSIS — Z83438 Family history of other disorder of lipoprotein metabolism and other lipidemia: Secondary | ICD-10-CM | POA: Diagnosis not present

## 2023-06-14 DIAGNOSIS — Z853 Personal history of malignant neoplasm of breast: Secondary | ICD-10-CM

## 2023-06-14 DIAGNOSIS — E11649 Type 2 diabetes mellitus with hypoglycemia without coma: Secondary | ICD-10-CM | POA: Diagnosis not present

## 2023-06-14 DIAGNOSIS — R0602 Shortness of breath: Secondary | ICD-10-CM

## 2023-06-14 DIAGNOSIS — Z8249 Family history of ischemic heart disease and other diseases of the circulatory system: Secondary | ICD-10-CM | POA: Diagnosis not present

## 2023-06-14 DIAGNOSIS — Z7984 Long term (current) use of oral hypoglycemic drugs: Secondary | ICD-10-CM

## 2023-06-14 DIAGNOSIS — E1122 Type 2 diabetes mellitus with diabetic chronic kidney disease: Secondary | ICD-10-CM | POA: Diagnosis present

## 2023-06-14 DIAGNOSIS — E785 Hyperlipidemia, unspecified: Secondary | ICD-10-CM | POA: Diagnosis not present

## 2023-06-14 DIAGNOSIS — Z86008 Personal history of in-situ neoplasm of other site: Secondary | ICD-10-CM

## 2023-06-14 DIAGNOSIS — E782 Mixed hyperlipidemia: Secondary | ICD-10-CM | POA: Diagnosis present

## 2023-06-14 DIAGNOSIS — Y92239 Unspecified place in hospital as the place of occurrence of the external cause: Secondary | ICD-10-CM | POA: Diagnosis not present

## 2023-06-14 DIAGNOSIS — K219 Gastro-esophageal reflux disease without esophagitis: Secondary | ICD-10-CM | POA: Diagnosis present

## 2023-06-14 LAB — BASIC METABOLIC PANEL
Anion gap: 10 (ref 5–15)
BUN: 47 mg/dL — ABNORMAL HIGH (ref 8–23)
CO2: 26 mmol/L (ref 22–32)
Calcium: 8.6 mg/dL — ABNORMAL LOW (ref 8.9–10.3)
Chloride: 84 mmol/L — ABNORMAL LOW (ref 98–111)
Creatinine, Ser: 2.59 mg/dL — ABNORMAL HIGH (ref 0.44–1.00)
GFR, Estimated: 17 mL/min — ABNORMAL LOW (ref >60)
Glucose, Bld: 196 mg/dL — ABNORMAL HIGH (ref 70–99)
Potassium: 3.6 mmol/L (ref 3.5–5.1)
Sodium: 120 mmol/L — ABNORMAL LOW (ref 135–145)

## 2023-06-14 LAB — CBC WITH DIFFERENTIAL/PLATELET
Abs Immature Granulocytes: 0.02 10*3/uL (ref 0.00–0.07)
Basophils Absolute: 0 10*3/uL (ref 0.0–0.1)
Basophils Relative: 1 %
Eosinophils Absolute: 0 10*3/uL (ref 0.0–0.5)
Eosinophils Relative: 1 %
HCT: 32.7 % — ABNORMAL LOW (ref 36.0–46.0)
Hemoglobin: 11.1 g/dL — ABNORMAL LOW (ref 12.0–15.0)
Immature Granulocytes: 1 %
Lymphocytes Relative: 14 %
Lymphs Abs: 0.6 10*3/uL — ABNORMAL LOW (ref 0.7–4.0)
MCH: 31 pg (ref 26.0–34.0)
MCHC: 33.9 g/dL (ref 30.0–36.0)
MCV: 91.3 fL (ref 80.0–100.0)
Monocytes Absolute: 0.4 10*3/uL (ref 0.1–1.0)
Monocytes Relative: 10 %
Neutro Abs: 2.9 10*3/uL (ref 1.7–7.7)
Neutrophils Relative %: 73 %
Platelets: 225 10*3/uL (ref 150–400)
RBC: 3.58 MIL/uL — ABNORMAL LOW (ref 3.87–5.11)
RDW: 13 % (ref 11.5–15.5)
WBC: 3.9 10*3/uL — ABNORMAL LOW (ref 4.0–10.5)
nRBC: 0 % (ref 0.0–0.2)

## 2023-06-14 NOTE — Progress Notes (Signed)
Established Patient Office Visit  Subjective   Patient ID: Emily Jennings, female    DOB: November 17, 1933  Age: 89 y.o. MRN: [id number]  Chief Complaint  Patient presents with   Follow-up    ER follow up    Emily Jennings presents today for ER follow-up.  She presented to [facility] ED on 9/21 endorsing shortness of breath.  Recent admission earlier this month for CHF exacerbation/fluid retention.  Labs on ED presentation were reassuring.  Chest x-ray demonstrated cardiomegaly with small pleural effusions.  Patient was treated with IV Lasix.  Diuresed well and was discharged home with close cardiology follow-up and instructions to take Lasix every day instead of every other day.  There have been no acute interval events.  Today Emily Jennings states that swelling in her legs and abdomen has worsened.  She endorses shortness of breath and can only speak in short sentences due to feeling out of breath.  She additionally endorses orthopnea.  She has continued to take Lasix every other day instead of daily as instructed.  Past Medical History:  Diagnosis Date   Allergy    Anxiety    ANXIETY DISORDER, GENERALIZED 07/15/2007   Qualifier: Diagnosis of  By: Chipper Herb     Arthritis    Cancer [facility]) 2009   breast, carcinoma in situ left   Carcinoma in situ of breast 05/21/2008   Qualifier: Diagnosis of  By: [name] MD, Margaret  Diagnosed in 2009, completed 5 year course of tamoxifen, no evidence of recurrence    Carotid stenosis    11/16/2005  mild plaque formation and stenosis proximal right ECA   Cataract    Complication of anesthesia    Coronary artery disease    cardiac catheterization on 03/20/2006  LAD mid 40% stenosis, left circumflex mild 40% stenosis, RCA mid-vessel 40% to 50% lesion   EF 60%   Diabetes mellitus    GERD (gastroesophageal reflux disease)    Hernia, inguinal    left   Hyperglycemia    Hypertension    Insomnia 11/16/2011   Low blood potassium    Non-insulin dependent type 2  diabetes mellitus (HCC)    Osteoporosis    Shortness of breath    2D Echocardiogram 01/26/2009   EF of greater than 55%, mild MR, mild TR, normal ventricular function   Thickened endometrium 10/26/2017   Noted by gyne in 2017, missed 6 month follow up, referred in 09/2017   Ventricular tachycardia, non-sustained (HCC)    developed during stress test 02/08/2006, spontaneously aborted, mild reversible apical defect   Past Surgical History:  Procedure Laterality Date   BREAST LUMPECTOMY Left 2009   Left breast 2009   CATARACT EXTRACTION W/PHACO Left 10/28/2014   Procedure: PHACO EMULSION CATARACT EXTRACTION WITH INTRAOCULAR LENS IMPLANT LEFT EYE (IOC);  Surgeon: Chalmers Guest, MD;  Location: [facility] OR;  Service: Ophthalmology;  Laterality: Left;   COLONOSCOPY     cyst removed from left foot     REFRACTIVE SURGERY Left    Social History   Tobacco Use   Smoking status: Never    Passive exposure: Never   Smokeless tobacco: Never  Vaping Use   Vaping status: Never Used  Substance Use Topics   Alcohol use: No   Drug use: No   Family History  Problem Relation Age of Onset   Hypertension Mother    Hyperlipidemia Mother    Stroke Mother    Urticaria Mother    Cancer Father  pancreatic   Colon cancer Father    Heart disease Brother 40       bypass   Heart disease Brother 67       bypass   Arthritis Other    Asthma Other    Diabetes Other    Colon cancer Paternal Aunt    Esophageal cancer Neg Hx    Stomach cancer Neg Hx    Rectal cancer Neg Hx    Allergies  Allergen Reactions   Amlodipine Swelling   Benadryl [Diphenhydramine Hcl] Hypertension   Citalopram Other (See Comments)    unknown   Farxiga [Dapagliflozin] Other (See Comments)    DKA   Metformin And Related Diarrhea    Lost appetite and weight    Tramadol Other (See Comments)    Felt light headed and dizzy   Crestor [Rosuvastatin] Other (See Comments)    "Feet swelling", makes her feel weak. Pt takes 5mg  at  home   Review of Systems  Constitutional:  Positive for malaise/fatigue.  Respiratory:  Positive for shortness of breath.   Cardiovascular:  Positive for orthopnea, leg swelling and PND.      Objective:     BP (!) 119/49 (BP Location: Left Arm, Patient Position: Sitting, Cuff Size: Normal)   Pulse 74   Ht 5' (1.524 m)   SpO2 90%   BMI 25.40 kg/m  BP Readings from Last 3 Encounters:  06/14/23 112/65  06/14/23 (!) 119/49  06/09/23 137/66   Physical Exam Vitals reviewed.  Constitutional:      Comments: Appears uncomfortable.  Only able to speak in short sentences.  HENT:     Head: Normocephalic and atraumatic.     Nose: Nose normal.     Mouth/Throat:     Mouth: Mucous membranes are moist.     Pharynx: No oropharyngeal exudate or posterior oropharyngeal erythema.  Eyes:     Conjunctiva/sclera: Conjunctivae normal.     Pupils: Pupils are equal, round, and reactive to light.  Cardiovascular:     Rate and Rhythm: Normal rate and regular rhythm.     Heart sounds: Murmur heard.  Pulmonary:     Comments: Tachypneic.  Bibasilar crackles noted on exam Abdominal:     General: Bowel sounds are normal.     Palpations: Abdomen is soft.     Tenderness: There is no abdominal tenderness.  Musculoskeletal:        General: Swelling (2+ bilateral lower extremity edema noted on exam) present.     Right lower leg: Edema present.     Left lower leg: Edema present.  Skin:    General: Skin is warm and dry.     Capillary Refill: Capillary refill takes less than 2 seconds.  Neurological:     General: No focal deficit present.     Mental Status: She is alert and oriented to person, place, and time.  Psychiatric:        Mood and Affect: Mood normal.   Last CBC Lab Results  Component Value Date   WBC 4.6 06/09/2023   HGB 9.5 (L) 06/09/2023   HCT 29.1 (L) 06/09/2023   MCV 94.8 06/09/2023   MCH 30.9 06/09/2023   RDW 13.8 06/09/2023   PLT 215 06/09/2023   Last metabolic panel Lab  Results  Component Value Date   GLUCOSE 199 (H) 06/09/2023   NA 129 (L) 06/09/2023   K 2.8 (L) 06/09/2023   CL 90 (L) 06/09/2023   CO2 27 06/09/2023   BUN 49 (  H) 06/09/2023   CREATININE 2.44 (H) 06/09/2023   GFRNONAA 19 (L) 06/09/2023   CALCIUM 8.2 (L) 06/09/2023   PHOS 3.1 06/04/2023   PROT 4.1 (L) 06/02/2023   ALBUMIN 2.2 (L) 06/04/2023   LABGLOB 2.3 09/05/2022   AGRATIO 1.7 09/05/2022   BILITOT 0.7 06/02/2023   ALKPHOS 52 06/02/2023   AST 21 06/02/2023   ALT 19 06/02/2023   ANIONGAP 12 06/09/2023   Last lipids Lab Results  Component Value Date   CHOL 192 11/24/2021   HDL 118 11/24/2021   LDLCALC 65 11/24/2021   TRIG 66 05/18/2023   CHOLHDL 1.6 11/24/2021   Last hemoglobin A1c Lab Results  Component Value Date   HGBA1C 7.5 (A) 05/01/2023   Last thyroid functions Lab Results  Component Value Date   TSH 0.678 09/05/2022   Last vitamin D Lab Results  Component Value Date   VD25OH 35 08/14/2018     Assessment & Plan:   Problem List Items Addressed This Visit       Acute on chronic diastolic (congestive) heart failure (HCC)    Presenting today for ER follow-up in the setting of recent ER presentation for volume overload.  Treated with IV diuretics and instructed to take Lasix every day.  Close cardiology follow-up was arranged.  Today she appears uncomfortable.  She is only able to speak in short sentences due to becoming short of breath.  She was not able to ambulate to our exam rooms due to becoming short of breath with exertion.  2+ bilateral lower extremity edema is present on exam.  Bibasilar crackles are present as well.  I am concerned that she remains volume overloaded given that she has not increased the frequency at which she takes Lasix.  She is not hypoxic.  Vital signs are otherwise stable. -Treatment options were reviewed with Emily Jennings.  Ultimately, I have recommended that she present to the emergency department due to volume overload.  She is in  agreement with this plan as she desires abrupt symptom relief.  Per review of previous documentation, symptoms quickly improved with IV diuresis on 9/21. -Cardiology follow-up is scheduled for 9/30       Hypokalemia    Noted during recent ED visit.  Treated with potassium supplementation.  She will need repeat labs upon ED presentation.      Return if symptoms worsen or fail to improve.   Billie Lade, MD

## 2023-06-14 NOTE — Assessment & Plan Note (Signed)
Presenting today for ER follow-up in the setting of recent ER presentation for volume overload.  Treated with IV diuretics and instructed to take Lasix every day.  Close cardiology follow-up was arranged.  Today she appears uncomfortable.  She is only able to speak in short sentences due to becoming short of breath.  She was not able to ambulate to our exam rooms due to becoming short of breath with exertion.  2+ bilateral lower extremity edema is present on exam.  Bibasilar crackles are present as well.  I am concerned that she remains volume overloaded given that she has not increased the frequency at which she takes Lasix.  She is not hypoxic.  Vital signs are otherwise stable. -Treatment options were reviewed with Emily Jennings.  Ultimately, I have recommended that she present to the emergency department due to volume overload.  She is in agreement with this plan as she desires abrupt symptom relief.  Per review of previous documentation, symptoms quickly improved with IV diuresis on 9/21. -Cardiology follow-up is scheduled for 9/30

## 2023-06-14 NOTE — Assessment & Plan Note (Signed)
Noted during recent ED visit.  Treated with potassium supplementation.  She will need repeat labs upon ED presentation.

## 2023-06-14 NOTE — Patient Instructions (Signed)
It was a pleasure to see you today.  Thank you for giving [place] the opportunity to be involved in your care.  Below is a brief recap of your visit and next steps.  We will plan to see you again in October.  Summary I recommend presenting to the emergency department for treatment given heart failure with volume overload. Follow up with Dr. Wyline Mood on Monday 9/30 and Dr. [name] on 10/8

## 2023-06-14 NOTE — ED Triage Notes (Signed)
Pt c/o sob for the last few days  Pt denies any pain  Pt able to speak in complete sentences

## 2023-06-14 NOTE — ED Provider Notes (Addendum)
[place] [facility] AT [facility] Provider Note   CSN: [id number] Arrival date & time: 06/14/23  1540     History  Chief Complaint  Patient presents with   Shortness of Breath    Emily Jennings is a 89 y.o. female.  Patient is an 89 year old female with extensive past medical history including congestive heart failure, diabetes, hyperlipidemia, chronic renal insufficiency.  Patient presenting today for evaluation of shortness of breath.  This has been occurring intermittently for the past 2 weeks.  Patient was here last week.  After workup was performed, she was discharged and advised to increase her Lasix to once daily instead of every other day, however it sounds like she has not been doing this.  She denies any chest pain, fevers, cough.  She does report consistent lower extremity swelling.  The history is provided by the patient.       Home Medications Prior to Admission medications   Medication Sig Start Date End Date Taking? Authorizing Provider  calcitRIOL (ROCALTROL) 0.25 MCG capsule Take 0.25 mcg by mouth 3 (three) times a week. Patient not taking: Reported on 06/06/2023    [provider]  carvedilol (COREG) 25 MG tablet Take 1 tablet (25 mg total) by mouth 2 (two) times daily with a meal. 05/28/23   Danford, Earl Lites, MD  cholecalciferol (VITAMIN D3) 25 MCG (1000 UNIT) tablet Take 1,000 Units by mouth daily.    [provider]  furosemide (LASIX) 40 MG tablet Take 1 tablet (40 mg total) by mouth every other day. 06/05/23 06/04/24  Johnson, Clanford L, MD  hydrALAZINE (APRESOLINE) 100 MG tablet Take 1 tablet (100 mg total) by mouth 3 (three) times daily. 05/28/23   Danford, Earl Lites, MD  insulin degludec (TRESIBA FLEXTOUCH) 100 UNIT/ML FlexTouch Pen Inject 5 Units into the skin daily. 05/01/23   Shamleffer, Konrad Dolores, MD  insulin lispro (HUMALOG KWIKPEN) 100 UNIT/ML KwikPen Inject 5 Units into the skin 3 (three) times daily.  Max daily 30 units 05/01/23   Shamleffer, Konrad Dolores, MD  NIFEdipine (ADALAT CC) 60 MG 24 hr tablet Take 1 tablet (60 mg total) by mouth daily. 05/29/23   Danford, Earl Lites, MD  potassium chloride (KLOR-CON M) 10 MEQ tablet Take 1 tablet (10 mEq total) by mouth every other day. Take only when taking furosemide 06/05/23   Johnson, Clanford L, MD  rosuvastatin (CRESTOR) 5 MG tablet TAKE 1 TABLET (5 MG TOTAL) BY MOUTH DAILY. Patient taking differently: Take 5 mg by mouth daily. 01/15/23   [name], MD  VITAMIN A-BETA CAROTENE PO Take 1 tablet by mouth daily.    [provider]      Allergies    Amlodipine, Benadryl [diphenhydramine hcl], Citalopram, Farxiga [dapagliflozin], Metformin and related, Tramadol, and Crestor [rosuvastatin]    Review of Systems   Review of Systems  All other systems reviewed and are negative.   Physical Exam Updated Vital Signs BP 112/65 (BP Location: Left Arm)   Pulse 69   Temp (!) 97.5 F (36.4 C)   Resp (!) 22   Ht 5' (1.524 m)   Wt 59 kg   SpO2 96%   BMI 25.40 kg/m  Physical Exam Vitals and nursing note reviewed.  Constitutional:      General: She is not in acute distress.    Appearance: She is well-developed. She is not diaphoretic.  HENT:     Head: Normocephalic and atraumatic.  Cardiovascular:     Rate  and Rhythm: Normal rate and regular rhythm.     Heart sounds: No murmur heard.    No friction rub. No gallop.  Pulmonary:     Effort: Pulmonary effort is normal. No respiratory distress.     Breath sounds: Rales present. No wheezing.     Comments: Light rales in the bases. Abdominal:     General: Bowel sounds are normal. There is no distension.     Palpations: Abdomen is soft.     Tenderness: There is no abdominal tenderness.  Musculoskeletal:        General: Normal range of motion.     Cervical back: Normal range of motion and neck supple.     Right lower leg: Edema present.     Left lower leg: Edema present.      Comments: There is 1-2+ pitting edema of both lower extremities.  Skin:    General: Skin is warm and dry.  Neurological:     General: No focal deficit present.     Mental Status: She is alert and oriented to person, place, and time.     ED Results / Procedures / Treatments   Labs (all labs ordered are listed, but only abnormal results are displayed) Labs Reviewed  CBC WITH DIFFERENTIAL/PLATELET - Abnormal; Notable for the following components:      Result Value   WBC 3.9 (*)    RBC 3.58 (*)    Hemoglobin 11.1 (*)    HCT 32.7 (*)    Lymphs Abs 0.6 (*)    All other components within normal limits  BASIC METABOLIC PANEL - Abnormal; Notable for the following components:   Sodium 120 (*)    Chloride 84 (*)    Glucose, Bld 196 (*)    BUN 47 (*)    Creatinine, Ser 2.59 (*)    Calcium 8.6 (*)    GFR, Estimated 17 (*)    All other components within normal limits  BRAIN NATRIURETIC PEPTIDE    EKG None  Radiology DG Chest 2 View  Result Date: 06/14/2023 CLINICAL DATA:  Dyspnea EXAM: CHEST - 2 VIEW COMPARISON:  06/09/2023 FINDINGS: Small bilateral pleural effusions are present, left greater than right. No confluent pulmonary infiltrate. No pneumothorax. Cardiac size is at the upper limits of normal. Pulmonary vascularity is normal. No acute bone abnormality. IMPRESSION: 1. Small bilateral pleural effusions, left greater than right. Electronically Signed   By: Helyn Numbers M.D.   On: 06/14/2023 18:53    Procedures Procedures    Medications Ordered in ED Medications - No data to display  ED Course/ Medical Decision Making/ A&P  Patient is a an 89 year old female with history of congestive heart failure.  Patient presenting today with complaints of shortness of breath as described in the HPI.  Patient arrives here with stable vital signs and is afebrile.  There is no hypoxia.  Patient is clinically well-appearing.  Workup initiated including CBC, basic metabolic panel, and  BNP.  CBC is unremarkable, however metabolic panel reveals a sodium of 120 as well as baseline renal insufficiency.  BNP is 588.  Chest x-ray shows small bilateral pleural effusions.  I have discussed care with the hospitalist and patient will be admitted for diuresis and correction of her sodium.  Final Clinical Impression(s) / ED Diagnoses Final diagnoses:  None    Rx / DC Orders ED Discharge Orders     None         Geoffery Lyons, MD 06/15/23 [phone]  Geoffery Lyons, MD 06/15/23 [phone]

## 2023-06-15 DIAGNOSIS — I10 Essential (primary) hypertension: Secondary | ICD-10-CM | POA: Diagnosis not present

## 2023-06-15 DIAGNOSIS — Z794 Long term (current) use of insulin: Secondary | ICD-10-CM

## 2023-06-15 DIAGNOSIS — Z86008 Personal history of in-situ neoplasm of other site: Secondary | ICD-10-CM | POA: Diagnosis not present

## 2023-06-15 DIAGNOSIS — E871 Hypo-osmolality and hyponatremia: Secondary | ICD-10-CM | POA: Diagnosis not present

## 2023-06-15 DIAGNOSIS — F411 Generalized anxiety disorder: Secondary | ICD-10-CM | POA: Diagnosis present

## 2023-06-15 DIAGNOSIS — I5033 Acute on chronic diastolic (congestive) heart failure: Secondary | ICD-10-CM

## 2023-06-15 DIAGNOSIS — E785 Hyperlipidemia, unspecified: Secondary | ICD-10-CM | POA: Diagnosis not present

## 2023-06-15 DIAGNOSIS — N1831 Chronic kidney disease, stage 3a: Secondary | ICD-10-CM | POA: Diagnosis not present

## 2023-06-15 DIAGNOSIS — E11649 Type 2 diabetes mellitus with hypoglycemia without coma: Secondary | ICD-10-CM | POA: Diagnosis not present

## 2023-06-15 DIAGNOSIS — R0602 Shortness of breath: Secondary | ICD-10-CM | POA: Diagnosis not present

## 2023-06-15 DIAGNOSIS — I509 Heart failure, unspecified: Secondary | ICD-10-CM | POA: Diagnosis not present

## 2023-06-15 DIAGNOSIS — Z833 Family history of diabetes mellitus: Secondary | ICD-10-CM | POA: Diagnosis not present

## 2023-06-15 DIAGNOSIS — N184 Chronic kidney disease, stage 4 (severe): Secondary | ICD-10-CM

## 2023-06-15 DIAGNOSIS — N179 Acute kidney failure, unspecified: Secondary | ICD-10-CM

## 2023-06-15 DIAGNOSIS — Z888 Allergy status to other drugs, medicaments and biological substances status: Secondary | ICD-10-CM | POA: Diagnosis not present

## 2023-06-15 DIAGNOSIS — E876 Hypokalemia: Secondary | ICD-10-CM | POA: Diagnosis not present

## 2023-06-15 DIAGNOSIS — I2510 Atherosclerotic heart disease of native coronary artery without angina pectoris: Secondary | ICD-10-CM | POA: Diagnosis present

## 2023-06-15 DIAGNOSIS — Z8249 Family history of ischemic heart disease and other diseases of the circulatory system: Secondary | ICD-10-CM | POA: Diagnosis not present

## 2023-06-15 DIAGNOSIS — Z825 Family history of asthma and other chronic lower respiratory diseases: Secondary | ICD-10-CM | POA: Diagnosis not present

## 2023-06-15 DIAGNOSIS — Z961 Presence of intraocular lens: Secondary | ICD-10-CM | POA: Diagnosis present

## 2023-06-15 DIAGNOSIS — I130 Hypertensive heart and chronic kidney disease with heart failure and stage 1 through stage 4 chronic kidney disease, or unspecified chronic kidney disease: Secondary | ICD-10-CM | POA: Diagnosis not present

## 2023-06-15 DIAGNOSIS — N189 Chronic kidney disease, unspecified: Secondary | ICD-10-CM | POA: Diagnosis not present

## 2023-06-15 DIAGNOSIS — Z885 Allergy status to narcotic agent status: Secondary | ICD-10-CM | POA: Diagnosis not present

## 2023-06-15 DIAGNOSIS — E1122 Type 2 diabetes mellitus with diabetic chronic kidney disease: Secondary | ICD-10-CM

## 2023-06-15 DIAGNOSIS — E8770 Fluid overload, unspecified: Secondary | ICD-10-CM | POA: Diagnosis not present

## 2023-06-15 DIAGNOSIS — Z79899 Other long term (current) drug therapy: Secondary | ICD-10-CM | POA: Diagnosis not present

## 2023-06-15 DIAGNOSIS — R0989 Other specified symptoms and signs involving the circulatory and respiratory systems: Secondary | ICD-10-CM | POA: Diagnosis not present

## 2023-06-15 DIAGNOSIS — Z823 Family history of stroke: Secondary | ICD-10-CM | POA: Diagnosis not present

## 2023-06-15 DIAGNOSIS — J90 Pleural effusion, not elsewhere classified: Secondary | ICD-10-CM | POA: Diagnosis not present

## 2023-06-15 DIAGNOSIS — Y92239 Unspecified place in hospital as the place of occurrence of the external cause: Secondary | ICD-10-CM | POA: Diagnosis not present

## 2023-06-15 DIAGNOSIS — Z9842 Cataract extraction status, left eye: Secondary | ICD-10-CM | POA: Diagnosis not present

## 2023-06-15 DIAGNOSIS — Z83438 Family history of other disorder of lipoprotein metabolism and other lipidemia: Secondary | ICD-10-CM | POA: Diagnosis not present

## 2023-06-15 DIAGNOSIS — D649 Anemia, unspecified: Secondary | ICD-10-CM | POA: Diagnosis not present

## 2023-06-15 DIAGNOSIS — Z800 Family history of malignant neoplasm of digestive organs: Secondary | ICD-10-CM | POA: Diagnosis not present

## 2023-06-15 LAB — BASIC METABOLIC PANEL
Anion gap: 12 (ref 5–15)
Anion gap: 11 (ref 5–15)
Anion gap: 10 (ref 5–15)
BUN: 48 mg/dL — ABNORMAL HIGH (ref 8–23)
BUN: 49 mg/dL — ABNORMAL HIGH (ref 8–23)
BUN: 47 mg/dL — ABNORMAL HIGH (ref 8–23)
CO2: 25 mmol/L (ref 22–32)
CO2: 28 mmol/L (ref 22–32)
CO2: 28 mmol/L (ref 22–32)
Calcium: 8.2 mg/dL — ABNORMAL LOW (ref 8.9–10.3)
Calcium: 8.3 mg/dL — ABNORMAL LOW (ref 8.9–10.3)
Calcium: 8.2 mg/dL — ABNORMAL LOW (ref 8.9–10.3)
Chloride: 85 mmol/L — ABNORMAL LOW (ref 98–111)
Chloride: 87 mmol/L — ABNORMAL LOW (ref 98–111)
Chloride: 88 mmol/L — ABNORMAL LOW (ref 98–111)
Creatinine, Ser: 2.46 mg/dL — ABNORMAL HIGH (ref 0.44–1.00)
Creatinine, Ser: 2.38 mg/dL — ABNORMAL HIGH (ref 0.44–1.00)
Creatinine, Ser: 2.42 mg/dL — ABNORMAL HIGH (ref 0.44–1.00)
GFR, Estimated: 19 mL/min — ABNORMAL LOW (ref >60)
GFR, Estimated: 19 mL/min — ABNORMAL LOW (ref >60)
GFR, Estimated: 19 mL/min — ABNORMAL LOW (ref >60)
Glucose, Bld: 218 mg/dL — ABNORMAL HIGH (ref 70–99)
Glucose, Bld: 162 mg/dL — ABNORMAL HIGH (ref 70–99)
Glucose, Bld: 184 mg/dL — ABNORMAL HIGH (ref 70–99)
Potassium: 3.2 mmol/L — ABNORMAL LOW (ref 3.5–5.1)
Potassium: 3.6 mmol/L (ref 3.5–5.1)
Potassium: 3 mmol/L — ABNORMAL LOW (ref 3.5–5.1)
Sodium: 122 mmol/L — ABNORMAL LOW (ref 135–145)
Sodium: 126 mmol/L — ABNORMAL LOW (ref 135–145)
Sodium: 126 mmol/L — ABNORMAL LOW (ref 135–145)

## 2023-06-15 LAB — CBC WITH DIFFERENTIAL/PLATELET
Abs Immature Granulocytes: 0.02 10*3/uL (ref 0.00–0.07)
Basophils Absolute: 0 10*3/uL (ref 0.0–0.1)
Basophils Relative: 1 %
Eosinophils Absolute: 0.1 10*3/uL (ref 0.0–0.5)
Eosinophils Relative: 1 %
HCT: 26.5 % — ABNORMAL LOW (ref 36.0–46.0)
Hemoglobin: 9.2 g/dL — ABNORMAL LOW (ref 12.0–15.0)
Immature Granulocytes: 1 %
Lymphocytes Relative: 20 %
Lymphs Abs: 0.8 10*3/uL (ref 0.7–4.0)
MCH: 31.6 pg (ref 26.0–34.0)
MCHC: 34.7 g/dL (ref 30.0–36.0)
MCV: 91.1 fL (ref 80.0–100.0)
Monocytes Absolute: 0.7 10*3/uL (ref 0.1–1.0)
Monocytes Relative: 16 %
Neutro Abs: 2.5 10*3/uL (ref 1.7–7.7)
Neutrophils Relative %: 61 %
Platelets: 185 10*3/uL (ref 150–400)
RBC: 2.91 MIL/uL — ABNORMAL LOW (ref 3.87–5.11)
RDW: 13 % (ref 11.5–15.5)
WBC: 4.2 10*3/uL (ref 4.0–10.5)
nRBC: 0 % (ref 0.0–0.2)

## 2023-06-15 LAB — CBG MONITORING, ED: Glucose-Capillary: 208 mg/dL — ABNORMAL HIGH (ref 70–99)

## 2023-06-15 LAB — COMPREHENSIVE METABOLIC PANEL
ALT: 14 U/L (ref 0–44)
AST: 22 U/L (ref 15–41)
Albumin: 2.4 g/dL — ABNORMAL LOW (ref 3.5–5.0)
Alkaline Phosphatase: 49 U/L (ref 38–126)
Anion gap: 9 (ref 5–15)
BUN: 49 mg/dL — ABNORMAL HIGH (ref 8–23)
CO2: 28 mmol/L (ref 22–32)
Calcium: 8 mg/dL — ABNORMAL LOW (ref 8.9–10.3)
Chloride: 85 mmol/L — ABNORMAL LOW (ref 98–111)
Creatinine, Ser: 2.41 mg/dL — ABNORMAL HIGH (ref 0.44–1.00)
GFR, Estimated: 19 mL/min — ABNORMAL LOW (ref >60)
Glucose, Bld: 159 mg/dL — ABNORMAL HIGH (ref 70–99)
Potassium: 2.9 mmol/L — ABNORMAL LOW (ref 3.5–5.1)
Sodium: 122 mmol/L — ABNORMAL LOW (ref 135–145)
Total Bilirubin: 0.6 mg/dL (ref 0.3–1.2)
Total Protein: 4.3 g/dL — ABNORMAL LOW (ref 6.5–8.1)

## 2023-06-15 LAB — GLUCOSE, CAPILLARY
Glucose-Capillary: 215 mg/dL — ABNORMAL HIGH (ref 70–99)
Glucose-Capillary: 180 mg/dL — ABNORMAL HIGH (ref 70–99)
Glucose-Capillary: 144 mg/dL — ABNORMAL HIGH (ref 70–99)
Glucose-Capillary: 204 mg/dL — ABNORMAL HIGH (ref 70–99)

## 2023-06-15 LAB — TSH
TSH: 3.678 u[IU]/mL (ref 0.350–4.500)
TSH: 3.372 u[IU]/mL (ref 0.350–4.500)

## 2023-06-15 LAB — MAGNESIUM: Magnesium: 2.3 mg/dL (ref 1.7–2.4)

## 2023-06-15 LAB — BRAIN NATRIURETIC PEPTIDE: B Natriuretic Peptide: 588 pg/mL — ABNORMAL HIGH (ref 0.0–100.0)

## 2023-06-15 MED ORDER — POTASSIUM CHLORIDE CRYS ER 20 MEQ PO TBCR
40.0000 meq | EXTENDED_RELEASE_TABLET | Freq: Once | ORAL | Status: AC
  Administered 2023-06-15: 40 meq via ORAL
  Filled 2023-06-15: qty 2

## 2023-06-15 MED ORDER — CARVEDILOL 12.5 MG PO TABS
25.0000 mg | ORAL_TABLET | Freq: Two times a day (BID) | ORAL | Status: DC
  Administered 2023-06-15 – 2023-06-18 (×7): 25 mg via ORAL
  Filled 2023-06-15 (×7): qty 2

## 2023-06-15 MED ORDER — FUROSEMIDE 10 MG/ML IJ SOLN
40.0000 mg | Freq: Two times a day (BID) | INTRAMUSCULAR | Status: DC
  Administered 2023-06-15: 40 mg via INTRAVENOUS
  Filled 2023-06-15: qty 4

## 2023-06-15 MED ORDER — OXYCODONE HCL 5 MG PO TABS: 5.0000 mg | ORAL_TABLET | ORAL | Status: DC | PRN

## 2023-06-15 MED ORDER — ROSUVASTATIN CALCIUM 10 MG PO TABS
5.0000 mg | ORAL_TABLET | Freq: Every day | ORAL | Status: DC
  Administered 2023-06-15 – 2023-06-18 (×4): 5 mg via ORAL
  Filled 2023-06-15 (×4): qty 1

## 2023-06-15 MED ORDER — ACETAMINOPHEN 325 MG PO TABS: 650.0000 mg | ORAL_TABLET | Freq: Four times a day (QID) | ORAL | Status: DC | PRN

## 2023-06-15 MED ORDER — SODIUM CHLORIDE 1 G PO TABS
1.0000 g | ORAL_TABLET | Freq: Two times a day (BID) | ORAL | Status: DC
  Administered 2023-06-15: 1 g via ORAL
  Filled 2023-06-15 (×8): qty 1

## 2023-06-15 MED ORDER — ONDANSETRON HCL 4 MG/2ML IJ SOLN: 4.0000 mg | Freq: Four times a day (QID) | INTRAMUSCULAR | Status: DC | PRN

## 2023-06-15 MED ORDER — HEPARIN SODIUM (PORCINE) 5000 UNIT/ML IJ SOLN
5000.0000 [IU] | Freq: Three times a day (TID) | INTRAMUSCULAR | Status: DC
  Administered 2023-06-15 – 2023-06-18 (×11): 5000 [IU] via SUBCUTANEOUS
  Filled 2023-06-15 (×11): qty 1

## 2023-06-15 MED ORDER — ONDANSETRON HCL 4 MG PO TABS: 4.0000 mg | ORAL_TABLET | Freq: Four times a day (QID) | ORAL | Status: DC | PRN

## 2023-06-15 MED ORDER — FUROSEMIDE 10 MG/ML IJ SOLN
80.0000 mg | Freq: Two times a day (BID) | INTRAMUSCULAR | Status: DC
  Administered 2023-06-15 – 2023-06-16 (×4): 80 mg via INTRAVENOUS
  Filled 2023-06-15 (×5): qty 8

## 2023-06-15 MED ORDER — ACETAMINOPHEN 650 MG RE SUPP: 650.0000 mg | Freq: Four times a day (QID) | RECTAL | Status: DC | PRN

## 2023-06-15 MED ORDER — HYDRALAZINE HCL 25 MG PO TABS
100.0000 mg | ORAL_TABLET | Freq: Three times a day (TID) | ORAL | Status: DC
  Administered 2023-06-15 – 2023-06-18 (×10): 100 mg via ORAL
  Filled 2023-06-15 (×11): qty 4

## 2023-06-15 MED ORDER — INSULIN ASPART 100 UNIT/ML IJ SOLN
0.0000 [IU] | Freq: Every day | INTRAMUSCULAR | Status: DC
  Administered 2023-06-15 (×2): 2 [IU] via SUBCUTANEOUS
  Filled 2023-06-15: qty 1

## 2023-06-15 MED ORDER — INSULIN ASPART 100 UNIT/ML IJ SOLN
0.0000 [IU] | Freq: Three times a day (TID) | INTRAMUSCULAR | Status: DC
  Administered 2023-06-15: 3 [IU] via SUBCUTANEOUS
  Administered 2023-06-15: 5 [IU] via SUBCUTANEOUS
  Administered 2023-06-15: 2 [IU] via SUBCUTANEOUS
  Administered 2023-06-16 (×2): 5 [IU] via SUBCUTANEOUS
  Administered 2023-06-16: 2 [IU] via SUBCUTANEOUS

## 2023-06-15 MED ORDER — CALCITRIOL 0.25 MCG PO CAPS
0.2500 ug | ORAL_CAPSULE | ORAL | Status: DC
  Administered 2023-06-15 – 2023-06-18 (×2): 0.25 ug via ORAL
  Filled 2023-06-15 (×2): qty 1

## 2023-06-15 NOTE — ED Notes (Signed)
ED TO INPATIENT HANDOFF REPORT  ED Nurse Name and Phone #: Wandra Mannan, Paramedic [phone]  S Name/Age/Gender Emily Jennings 89 y.o. female Room/Bed: APA08/APA08  Code Status   Code Status: Full Code  Home/SNF/Other Home Patient oriented to: self, place, time, and situation Is this baseline? Yes   Triage Complete: Triage complete  Chief Complaint Hyponatremia [E87.1]  Triage Note Pt c/o sob for the last few days  Pt denies any pain  Pt able to speak in complete sentences     Allergies Allergies  Allergen Reactions   Amlodipine Swelling   Benadryl [Diphenhydramine Hcl] Hypertension   Citalopram Other (See Comments)    unknown   Farxiga [Dapagliflozin] Other (See Comments)    DKA   Metformin And Related Diarrhea    Lost appetite and weight    Tramadol Other (See Comments)    Felt light headed and dizzy   Crestor [Rosuvastatin] Other (See Comments)    "Feet swelling", makes her feel weak. Pt takes 5mg  at home    Level of Care/Admitting Diagnosis ED Disposition     ED Disposition  Admit   Condition  --   Comment  Hospital Area: [facility] [[id number]]  Level of Care: Telemetry [5]  Covid Evaluation: Asymptomatic - no recent exposure (last 10 days) testing not required  Diagnosis: Hyponatremia [198519]  Admitting Physician: Lilyan Gilford [[id number]]  Attending Physician: Lilyan Gilford [[id number]]  Certification:: I certify this patient will need inpatient services for at least 2 midnights  Expected Medical Readiness: 06/18/2023          B Medical/Surgery History Past Medical History:  Diagnosis Date   Allergy    Anxiety    ANXIETY DISORDER, GENERALIZED 07/15/2007   Qualifier: Diagnosis of  By: Chipper Herb     Arthritis    Cancer [facility]) 2009   breast, carcinoma in situ left   Carcinoma in situ of breast 05/21/2008   Qualifier: Diagnosis of  By: [name] MD, Margaret  Diagnosed in 2009, completed 5 year course of  tamoxifen, no evidence of recurrence    Carotid stenosis    11/16/2005  mild plaque formation and stenosis proximal right ECA   Cataract    Complication of anesthesia    Coronary artery disease    cardiac catheterization on 03/20/2006  LAD mid 40% stenosis, left circumflex mild 40% stenosis, RCA mid-vessel 40% to 50% lesion   EF 60%   Diabetes mellitus    GERD (gastroesophageal reflux disease)    Hernia, inguinal    left   Hyperglycemia    Hypertension    Insomnia 11/16/2011   Low blood potassium    Non-insulin dependent type 2 diabetes mellitus (HCC)    Osteoporosis    Shortness of breath    2D Echocardiogram 01/26/2009   EF of greater than 55%, mild MR, mild TR, normal ventricular function   Thickened endometrium 10/26/2017   Noted by gyne in 2017, missed 6 month follow up, referred in 09/2017   Ventricular tachycardia, non-sustained (HCC)    developed during stress test 02/08/2006, spontaneously aborted, mild reversible apical defect   Past Surgical History:  Procedure Laterality Date   BREAST LUMPECTOMY Left 2009   Left breast 2009   CATARACT EXTRACTION W/PHACO Left 10/28/2014   Procedure: PHACO EMULSION CATARACT EXTRACTION WITH INTRAOCULAR LENS IMPLANT LEFT EYE (IOC);  Surgeon: Chalmers Guest, MD;  Location: [facility] OR;  Service: Ophthalmology;  Laterality: Left;   COLONOSCOPY     cyst removed from  left foot     REFRACTIVE SURGERY Left      A IV Location/Drains/Wounds Patient Lines/Drains/Airways Status     Active Line/Drains/Airways     Name Placement date Placement time Site Days   Peripheral IV 06/15/23 20 G 1" Left Antecubital 06/15/23  0203  Antecubital  less than 1   External Urinary Catheter 06/15/23  0229  --  less than 1            Intake/Output Last 24 hours No intake or output data in the 24 hours ending 06/15/23 0737  Labs/Imaging Results for orders placed or performed during the hospital encounter of 06/14/23 (from the past 48 hour(s))  CBC with  Differential     Status: Abnormal   Collection Time: 06/14/23  5:11 PM  Result Value Ref Range   WBC 3.9 (L) 4.0 - 10.5 K/uL   RBC 3.58 (L) 3.87 - 5.11 MIL/uL   Hemoglobin 11.1 (L) 12.0 - 15.0 g/dL   HCT 95.6 (L) 21.3 - 08.6 %   MCV 91.3 80.0 - 100.0 fL   MCH 31.0 26.0 - 34.0 pg   MCHC 33.9 30.0 - 36.0 g/dL   RDW 57.8 46.9 - 62.9 %   Platelets 225 150 - 400 K/uL   nRBC 0.0 0.0 - 0.2 %   Neutrophils Relative % 73 %   Neutro Abs 2.9 1.7 - 7.7 K/uL   Lymphocytes Relative 14 %   Lymphs Abs 0.6 (L) 0.7 - 4.0 K/uL   Monocytes Relative 10 %   Monocytes Absolute 0.4 0.1 - 1.0 K/uL   Eosinophils Relative 1 %   Eosinophils Absolute 0.0 0.0 - 0.5 K/uL   Basophils Relative 1 %   Basophils Absolute 0.0 0.0 - 0.1 K/uL   Immature Granulocytes 1 %   Abs Immature Granulocytes 0.02 0.00 - 0.07 K/uL    Comment: Performed at [facility], [address], [place], [place] [address]  Basic metabolic panel     Status: Abnormal   Collection Time: 06/14/23  5:11 PM  Result Value Ref Range   Sodium 120 (L) 135 - 145 mmol/L   Potassium 3.6 3.5 - 5.1 mmol/L   Chloride 84 (L) 98 - 111 mmol/L   CO2 26 22 - 32 mmol/L   Glucose, Bld 196 (H) 70 - 99 mg/dL    Comment: Glucose reference range applies only to samples taken after fasting for at least 8 hours.   BUN 47 (H) 8 - 23 mg/dL   Creatinine, Ser 3.24 (H) 0.44 - 1.00 mg/dL   Calcium 8.6 (L) 8.9 - 10.3 mg/dL   GFR, Estimated 17 (L) >60 mL/min    Comment: (NOTE) Calculated using the CKD-EPI Creatinine Equation (2021)    Anion gap 10 5 - 15    Comment: Performed at [facility], [address], [place], [place] [address]  Brain natriuretic peptide     Status: Abnormal   Collection Time: 06/14/23  5:11 PM  Result Value Ref Range   B Natriuretic Peptide 588.0 (H) 0.0 - 100.0 pg/mL    Comment: Performed at [facility], [address] [place], [place] [address]  CBG monitoring, ED     Status: Abnormal   Collection Time: 06/15/23  1:47 AM  Result Value  Ref Range   Glucose-Capillary 208 (H) 70 - 99 mg/dL    Comment: Glucose reference range applies only to samples taken after fasting for at least 8 hours.  Comprehensive metabolic panel     Status:  Abnormal   Collection Time: 06/15/23  5:04 AM  Result Value Ref Range   Sodium 122 (L) 135 - 145 mmol/L   Potassium 2.9 (L) 3.5 - 5.1 mmol/L   Chloride 85 (L) 98 - 111 mmol/L   CO2 28 22 - 32 mmol/L   Glucose, Bld 159 (H) 70 - 99 mg/dL    Comment: Glucose reference range applies only to samples taken after fasting for at least 8 hours.   BUN 49 (H) 8 - 23 mg/dL   Creatinine, Ser 1.60 (H) 0.44 - 1.00 mg/dL   Calcium 8.0 (L) 8.9 - 10.3 mg/dL   Total Protein 4.3 (L) 6.5 - 8.1 g/dL   Albumin 2.4 (L) 3.5 - 5.0 g/dL   AST 22 15 - 41 U/L   ALT 14 0 - 44 U/L   Alkaline Phosphatase 49 38 - 126 U/L   Total Bilirubin 0.6 0.3 - 1.2 mg/dL   GFR, Estimated 19 (L) >60 mL/min    Comment: (NOTE) Calculated using the CKD-EPI Creatinine Equation (2021)    Anion gap 9 5 - 15    Comment: Performed at [facility], [address] [place], [place] [address]  Magnesium     Status: None   Collection Time: 06/15/23  5:04 AM  Result Value Ref Range   Magnesium 2.3 1.7 - 2.4 mg/dL    Comment: Performed at [facility], [address], [place], [place] [address]  CBC with Differential/Platelet     Status: Abnormal   Collection Time: 06/15/23  5:04 AM  Result Value Ref Range   WBC 4.2 4.0 - 10.5 K/uL   RBC 2.91 (L) 3.87 - 5.11 MIL/uL   Hemoglobin 9.2 (L) 12.0 - 15.0 g/dL   HCT 85.4 (L) 62.7 - 03.5 %   MCV 91.1 80.0 - 100.0 fL   MCH 31.6 26.0 - 34.0 pg   MCHC 34.7 30.0 - 36.0 g/dL   RDW 00.9 38.1 - 82.9 %   Platelets 185 150 - 400 K/uL   nRBC 0.0 0.0 - 0.2 %   Neutrophils Relative % 61 %   Neutro Abs 2.5 1.7 - 7.7 K/uL   Lymphocytes Relative 20 %   Lymphs Abs 0.8 0.7 - 4.0 K/uL   Monocytes Relative 16 %   Monocytes Absolute 0.7 0.1 - 1.0 K/uL   Eosinophils Relative 1 %   Eosinophils Absolute 0.1 0.0 -  0.5 K/uL   Basophils Relative 1 %   Basophils Absolute 0.0 0.0 - 0.1 K/uL   Immature Granulocytes 1 %   Abs Immature Granulocytes 0.02 0.00 - 0.07 K/uL    Comment: Performed at [facility], [address] [place], [place] [address]  TSH     Status: None   Collection Time: 06/15/23  5:04 AM  Result Value Ref Range   TSH 3.678 0.350 - 4.500 uIU/mL    Comment: Performed by a 3rd Generation assay with a functional sensitivity of <=0.01 uIU/mL. Performed at [facility], [address], [place], [place] [address]    *Note: Due to a large number of results and/or encounters for the requested time period, some results have not been displayed. A complete set of results can be found in Results Review.   DG Chest 2 View  Result Date: 06/14/2023 CLINICAL DATA:  Dyspnea EXAM: CHEST - 2 VIEW COMPARISON:  06/09/2023 FINDINGS: Small bilateral pleural effusions are present, left greater than right. No confluent pulmonary infiltrate. No pneumothorax. Cardiac size is at the upper limits of  normal. Pulmonary vascularity is normal. No acute bone abnormality. IMPRESSION: 1. Small bilateral pleural effusions, left greater than right. Electronically Signed   By: Helyn Numbers M.D.   On: 06/14/2023 18:53    Pending Labs Unresulted Labs (From admission, onward)     Start     Ordered   06/15/23 1000  Basic metabolic panel  Now then every 6 hours,   R (with TIMED occurrences)      06/15/23 0647            Vitals/Pain Today's Vitals   06/15/23 0005 06/15/23 0215 06/15/23 0515 06/15/23 0630  BP: (!) 183/74 (!) 153/68 (!) 161/67 (!) 162/78  Pulse: 77 74 67 74  Resp: 17 17 16 18   Temp: 97.7 F (36.5 C) 97.7 F (36.5 C)    TempSrc: Oral Oral    SpO2: 99% 96% 96% 95%  Weight:      Height:      PainSc:    0-No pain    Isolation Precautions No active isolations  Medications Medications  furosemide (LASIX) injection 40 mg (40 mg Intravenous Given 06/15/23 0203)  sodium chloride tablet 1 g (has no  administration in time range)  carvedilol (COREG) tablet 25 mg (has no administration in time range)  hydrALAZINE (APRESOLINE) tablet 100 mg (has no administration in time range)  rosuvastatin (CRESTOR) tablet 5 mg (has no administration in time range)  calcitRIOL (ROCALTROL) capsule 0.25 mcg (has no administration in time range)  heparin injection 5,000 Units (5,000 Units Subcutaneous Given 06/15/23 0505)  acetaminophen (TYLENOL) tablet 650 mg (has no administration in time range)    Or  acetaminophen (TYLENOL) suppository 650 mg (has no administration in time range)  oxyCODONE (Oxy IR/ROXICODONE) immediate release tablet 5 mg (has no administration in time range)  ondansetron (ZOFRAN) tablet 4 mg (has no administration in time range)    Or  ondansetron (ZOFRAN) injection 4 mg (has no administration in time range)  insulin aspart (novoLOG) injection 0-15 Units (has no administration in time range)  insulin aspart (novoLOG) injection 0-5 Units (2 Units Subcutaneous Given 06/15/23 0205)    Mobility walks with device     Focused Assessments Cardiac Assessment Handoff:    No results found for: "CKTOTAL", "CKMB", "CKMBINDEX", "TROPONINI" No results found for: "DDIMER" Does the Patient currently have chest pain? No    R Recommendations: See Admitting Provider Note  Report given to:   Additional Notes: No pain currently. Expresses improvement in breathing.

## 2023-06-15 NOTE — Assessment & Plan Note (Signed)
Continue Crestor 

## 2023-06-15 NOTE — ED Notes (Signed)
Pt ambulated to the restroom with assistance.  

## 2023-06-15 NOTE — Assessment & Plan Note (Signed)
-  Continue Coreg and hydralazine °

## 2023-06-15 NOTE — Assessment & Plan Note (Signed)
-   Chest x-ray shows small bilateral pleural effusions, patient complains of dyspnea, patient has peripheral edema - Recently had a very similar admission and echo showed an ejection fraction of 65-70% and grade 2 diastolic dysfunction - Patient was recently seen in the ER for the same complaints and advised to take her Lasix every day instead of every other day, she was not able to make this change -Continue diuresis -monitor intake and output -Fluid restrictions -Continue to monitor

## 2023-06-15 NOTE — Assessment & Plan Note (Signed)
-   Sodium down to 120 - Hypervolemic hyponatremia - Continue diuresis

## 2023-06-15 NOTE — Assessment & Plan Note (Signed)
-   Creatinine at baseline 2.1, creatinine today 2.59 - Likely cardiorenal - Continue diuresis - Trend in the a.m.

## 2023-06-15 NOTE — Plan of Care (Signed)
  Problem: Education: Goal: Knowledge of disease or condition will improve Outcome: Progressing Goal: Knowledge of secondary prevention will improve (MUST DOCUMENT ALL) Outcome: Progressing Goal: Knowledge of patient specific risk factors will improve Loraine Leriche N/A or DELETE if not current risk factor) Outcome: Progressing   Problem: Intracerebral Hemorrhage Tissue Perfusion: Goal: Complications of Intracerebral Hemorrhage will be minimized Outcome: Progressing   Problem: Coping: Goal: Will verbalize positive feelings about self Outcome: Progressing Goal: Will identify appropriate support needs Outcome: Progressing   Problem: Health Behavior/Discharge Planning: Goal: Ability to manage health-related needs will improve Outcome: Progressing Goal: Goals will be collaboratively established with patient/family Outcome: Progressing   Problem: Self-Care: Goal: Ability to participate in self-care as condition permits will improve Outcome: Progressing Goal: Verbalization of feelings and concerns over difficulty with self-care will improve Outcome: Progressing Goal: Ability to communicate needs accurately will improve Outcome: Progressing   Problem: Nutrition: Goal: Risk of aspiration will decrease Outcome: Progressing Goal: Dietary intake will improve Outcome: Progressing   Problem: Education: Goal: Ability to demonstrate management of disease process will improve Outcome: Progressing Goal: Ability to verbalize understanding of medication therapies will improve Outcome: Progressing Goal: Individualized Educational Video(s) Outcome: Progressing   Problem: Activity: Goal: Capacity to carry out activities will improve Outcome: Progressing   Problem: Cardiac: Goal: Ability to achieve and maintain adequate cardiopulmonary perfusion will improve Outcome: Progressing   Problem: Education: Goal: Knowledge of General Education information will improve Description: Including pain  rating scale, medication(s)/side effects and non-pharmacologic comfort measures Outcome: Progressing   Problem: Health Behavior/Discharge Planning: Goal: Ability to manage health-related needs will improve Outcome: Progressing   Problem: Clinical Measurements: Goal: Ability to maintain clinical measurements within normal limits will improve Outcome: Progressing Goal: Will remain free from infection Outcome: Progressing Goal: Diagnostic test results will improve Outcome: Progressing Goal: Respiratory complications will improve Outcome: Progressing Goal: Cardiovascular complication will be avoided Outcome: Progressing   Problem: Activity: Goal: Risk for activity intolerance will decrease Outcome: Progressing   Problem: Nutrition: Goal: Adequate nutrition will be maintained Outcome: Progressing   Problem: Coping: Goal: Level of anxiety will decrease Outcome: Progressing   Problem: Elimination: Goal: Will not experience complications related to bowel motility Outcome: Progressing Goal: Will not experience complications related to urinary retention Outcome: Progressing   Problem: Pain Managment: Goal: General experience of comfort will improve Outcome: Progressing   Problem: Safety: Goal: Ability to remain free from injury will improve Outcome: Progressing   Problem: Skin Integrity: Goal: Risk for impaired skin integrity will decrease Outcome: Progressing

## 2023-06-15 NOTE — Consult Note (Signed)
[facility]  HISTORY AND PHYSICAL  Emily [facility] is an 89 y.o. female.    Chief Complaint: SOB  HPI: Pt is an 28F with a PMH sig for CHF, CKD, and h/o breast cancer who is now seen in consultation at the request of Dr Laural Benes for eval and recs re: hyponatremia.  Pt has had increasing SOB over the past several weeks.  Went to ED last week and was told to increase Lasix from every other day to every day.  She did but her SOB isn't any better.    Came to ED for eval.  CXR showing bilateral pleural effusions.  Cr 2.1-2.5.  Na is in the low 120s.  Recent RUS 05/20/23 without hydro.  Pt says she feels somewhat better.  Having a hard time using the purewick- feels like she isn't emptying all the way.    PMH: Past Medical History:  Diagnosis Date   Allergy    Anxiety    ANXIETY DISORDER, GENERALIZED 07/15/2007   Qualifier: Diagnosis of  By: Chipper Herb     Arthritis    Cancer [facility]) 2009   breast, carcinoma in situ left   Carcinoma in situ of breast 05/21/2008   Qualifier: Diagnosis of  By: [name] MD, Margaret  Diagnosed in 2009, completed 5 year course of tamoxifen, no evidence of recurrence    Carotid stenosis    11/16/2005  mild plaque formation and stenosis proximal right ECA   Cataract    Complication of anesthesia    Coronary artery disease    cardiac catheterization on 03/20/2006  LAD mid 40% stenosis, left circumflex mild 40% stenosis, RCA mid-vessel 40% to 50% lesion   EF 60%   Diabetes mellitus    GERD (gastroesophageal reflux disease)    Hernia, inguinal    left   Hyperglycemia    Hypertension    Insomnia 11/16/2011   Low blood potassium    Non-insulin dependent type 2 diabetes mellitus (HCC)    Osteoporosis    Shortness of breath    2D Echocardiogram 01/26/2009   EF of greater than 55%, mild MR, mild TR, normal ventricular function   Thickened endometrium 10/26/2017   Noted by gyne in 2017, missed 6 month follow up, referred in 09/2017   Ventricular  tachycardia, non-sustained (HCC)    developed during stress test 02/08/2006, spontaneously aborted, mild reversible apical defect   PSH: Past Surgical History:  Procedure Laterality Date   BREAST LUMPECTOMY Left 2009   Left breast 2009   CATARACT EXTRACTION W/PHACO Left 10/28/2014   Procedure: PHACO EMULSION CATARACT EXTRACTION WITH INTRAOCULAR LENS IMPLANT LEFT EYE (IOC);  Surgeon: Chalmers Guest, MD;  Location: [facility] OR;  Service: Ophthalmology;  Laterality: Left;   COLONOSCOPY     cyst removed from left foot     REFRACTIVE SURGERY Left     Past Medical History:  Diagnosis Date   Allergy    Anxiety    ANXIETY DISORDER, GENERALIZED 07/15/2007   Qualifier: Diagnosis of  By: Chipper Herb     Arthritis    Cancer [facility]) 2009   breast, carcinoma in situ left   Carcinoma in situ of breast 05/21/2008   Qualifier: Diagnosis of  By: [name] MD, Margaret  Diagnosed in 2009, completed 5 year course of tamoxifen, no evidence of recurrence    Carotid stenosis    11/16/2005  mild plaque formation and stenosis proximal right ECA   Cataract    Complication of anesthesia    Coronary  artery disease    cardiac catheterization on 03/20/2006  LAD mid 40% stenosis, left circumflex mild 40% stenosis, RCA mid-vessel 40% to 50% lesion   EF 60%   Diabetes mellitus    GERD (gastroesophageal reflux disease)    Hernia, inguinal    left   Hyperglycemia    Hypertension    Insomnia 11/16/2011   Low blood potassium    Non-insulin dependent type 2 diabetes mellitus (HCC)    Osteoporosis    Shortness of breath    2D Echocardiogram 01/26/2009   EF of greater than 55%, mild MR, mild TR, normal ventricular function   Thickened endometrium 10/26/2017   Noted by gyne in 2017, missed 6 month follow up, referred in 09/2017   Ventricular tachycardia, non-sustained (HCC)    developed during stress test 02/08/2006, spontaneously aborted, mild reversible apical defect    Medications:  Prior to Admission:  Medications  Prior to Admission  Medication Sig Dispense Refill Last Dose   calcitRIOL (ROCALTROL) 0.25 MCG capsule Take 0.25 mcg by mouth 3 (three) times a week.   Past Week   carvedilol (COREG) 25 MG tablet Take 1 tablet (25 mg total) by mouth 2 (two) times daily with a meal. 180 tablet 0 06/14/2023   cholecalciferol (VITAMIN D3) 25 MCG (1000 UNIT) tablet Take 1,000 Units by mouth daily.   Past Month   furosemide (LASIX) 40 MG tablet Take 1 tablet (40 mg total) by mouth every other day. 15 tablet 0 Past Week   hydrALAZINE (APRESOLINE) 100 MG tablet Take 1 tablet (100 mg total) by mouth 3 (three) times daily. 270 tablet 0 06/14/2023   insulin degludec (TRESIBA FLEXTOUCH) 100 UNIT/ML FlexTouch Pen Inject 5 Units into the skin daily. 15 mL 1 Past Week   insulin lispro (HUMALOG KWIKPEN) 100 UNIT/ML KwikPen Inject 5 Units into the skin 3 (three) times daily. Max daily 30 units 15 mL 3 06/14/2023   NIFEdipine (ADALAT CC) 60 MG 24 hr tablet Take 1 tablet (60 mg total) by mouth daily. 180 tablet 0 06/14/2023   potassium chloride (KLOR-CON M) 10 MEQ tablet Take 1 tablet (10 mEq total) by mouth every other day. Take only when taking furosemide 15 tablet 0 Past Week   rosuvastatin (CRESTOR) 5 MG tablet TAKE 1 TABLET (5 MG TOTAL) BY MOUTH DAILY. (Patient taking differently: Take 5 mg by mouth daily.) 90 tablet 3 06/14/2023   VITAMIN A-BETA CAROTENE PO Take 1 tablet by mouth daily.   Past Month    Medications Prior to Admission  Medication Sig Dispense Refill   calcitRIOL (ROCALTROL) 0.25 MCG capsule Take 0.25 mcg by mouth 3 (three) times a week.     carvedilol (COREG) 25 MG tablet Take 1 tablet (25 mg total) by mouth 2 (two) times daily with a meal. 180 tablet 0   cholecalciferol (VITAMIN D3) 25 MCG (1000 UNIT) tablet Take 1,000 Units by mouth daily.     furosemide (LASIX) 40 MG tablet Take 1 tablet (40 mg total) by mouth every other day. 15 tablet 0   hydrALAZINE (APRESOLINE) 100 MG tablet Take 1 tablet (100 mg total) by  mouth 3 (three) times daily. 270 tablet 0   insulin degludec (TRESIBA FLEXTOUCH) 100 UNIT/ML FlexTouch Pen Inject 5 Units into the skin daily. 15 mL 1   insulin lispro (HUMALOG KWIKPEN) 100 UNIT/ML KwikPen Inject 5 Units into the skin 3 (three) times daily. Max daily 30 units 15 mL 3   NIFEdipine (ADALAT CC) 60 MG 24 hr  tablet Take 1 tablet (60 mg total) by mouth daily. 180 tablet 0   potassium chloride (KLOR-CON M) 10 MEQ tablet Take 1 tablet (10 mEq total) by mouth every other day. Take only when taking furosemide 15 tablet 0   rosuvastatin (CRESTOR) 5 MG tablet TAKE 1 TABLET (5 MG TOTAL) BY MOUTH DAILY. (Patient taking differently: Take 5 mg by mouth daily.) 90 tablet 3   VITAMIN A-BETA CAROTENE PO Take 1 tablet by mouth daily.      ALLERGIES:   Allergies  Allergen Reactions   Amlodipine Swelling   Benadryl [Diphenhydramine Hcl] Hypertension   Citalopram Other (See Comments)    unknown   Farxiga [Dapagliflozin] Other (See Comments)    DKA   Metformin And Related Diarrhea    Lost appetite and weight    Tramadol Other (See Comments)    Felt light headed and dizzy   Crestor [Rosuvastatin] Other (See Comments)    "Feet swelling", makes her feel weak. Pt takes 5mg  at home    FAM HX: Family History  Problem Relation Age of Onset   Hypertension Mother    Hyperlipidemia Mother    Stroke Mother    Urticaria Mother    Cancer Father        pancreatic   Colon cancer Father    Heart disease Brother 40       bypass   Heart disease Brother 20       bypass   Arthritis Other    Asthma Other    Diabetes Other    Colon cancer Paternal Aunt    Esophageal cancer Neg Hx    Stomach cancer Neg Hx    Rectal cancer Neg Hx     Social History:   reports that she has never smoked. She has never been exposed to tobacco smoke. She has never used smokeless tobacco. She reports that she does not drink alcohol and does not use drugs.  ROS: ROS: all other systems reviewed and are negative  except as per HPI  Blood pressure (!) 156/67, pulse 76, temperature 97.8 F (36.6 C), temperature source Oral, resp. rate 18, height 5' (1.524 m), weight 59 kg, SpO2 99%. PHYSICAL EXAM: Physical Exam GEN nad, sitting up in bed HEENT EOMI PERRL NECK + JVD PULM muffled breath sounds CV RRR ABD soft EXT 2+ tight pitting LE edema NEURO AAO x 3 nonfocal  Results for orders placed or performed during the hospital encounter of 06/14/23 (from the past 48 hour(s))  CBC with Differential     Status: Abnormal   Collection Time: 06/14/23  5:11 PM  Result Value Ref Range   WBC 3.9 (L) 4.0 - 10.5 K/uL   RBC 3.58 (L) 3.87 - 5.11 MIL/uL   Hemoglobin 11.1 (L) 12.0 - 15.0 g/dL   HCT 27.2 (L) 53.6 - 64.4 %   MCV 91.3 80.0 - 100.0 fL   MCH 31.0 26.0 - 34.0 pg   MCHC 33.9 30.0 - 36.0 g/dL   RDW 03.4 74.2 - 59.5 %   Platelets 225 150 - 400 K/uL   nRBC 0.0 0.0 - 0.2 %   Neutrophils Relative % 73 %   Neutro Abs 2.9 1.7 - 7.7 K/uL   Lymphocytes Relative 14 %   Lymphs Abs 0.6 (L) 0.7 - 4.0 K/uL   Monocytes Relative 10 %   Monocytes Absolute 0.4 0.1 - 1.0 K/uL   Eosinophils Relative 1 %   Eosinophils Absolute 0.0 0.0 - 0.5 K/uL   Basophils  Relative 1 %   Basophils Absolute 0.0 0.0 - 0.1 K/uL   Immature Granulocytes 1 %   Abs Immature Granulocytes 0.02 0.00 - 0.07 K/uL    Comment: Performed at [facility], [address] [place], [place] [address]  Basic metabolic panel     Status: Abnormal   Collection Time: 06/14/23  5:11 PM  Result Value Ref Range   Sodium 120 (L) 135 - 145 mmol/L   Potassium 3.6 3.5 - 5.1 mmol/L   Chloride 84 (L) 98 - 111 mmol/L   CO2 26 22 - 32 mmol/L   Glucose, Bld 196 (H) 70 - 99 mg/dL    Comment: Glucose reference range applies only to samples taken after fasting for at least 8 hours.   BUN 47 (H) 8 - 23 mg/dL   Creatinine, Ser 6.21 (H) 0.44 - 1.00 mg/dL   Calcium 8.6 (L) 8.9 - 10.3 mg/dL   GFR, Estimated 17 (L) >60 mL/min    Comment: (NOTE) Calculated using the  CKD-EPI Creatinine Equation (2021)    Anion gap 10 5 - 15    Comment: Performed at [facility], [address] [place], [place] [address]  Brain natriuretic peptide     Status: Abnormal   Collection Time: 06/14/23  5:11 PM  Result Value Ref Range   B Natriuretic Peptide 588.0 (H) 0.0 - 100.0 pg/mL    Comment: Performed at [facility], [address], [place], [place] [address]  CBG monitoring, ED     Status: Abnormal   Collection Time: 06/15/23  1:47 AM  Result Value Ref Range   Glucose-Capillary 208 (H) 70 - 99 mg/dL    Comment: Glucose reference range applies only to samples taken after fasting for at least 8 hours.  Comprehensive metabolic panel     Status: Abnormal   Collection Time: 06/15/23  5:04 AM  Result Value Ref Range   Sodium 122 (L) 135 - 145 mmol/L   Potassium 2.9 (L) 3.5 - 5.1 mmol/L   Chloride 85 (L) 98 - 111 mmol/L   CO2 28 22 - 32 mmol/L   Glucose, Bld 159 (H) 70 - 99 mg/dL    Comment: Glucose reference range applies only to samples taken after fasting for at least 8 hours.   BUN 49 (H) 8 - 23 mg/dL   Creatinine, Ser 6.29 (H) 0.44 - 1.00 mg/dL   Calcium 8.0 (L) 8.9 - 10.3 mg/dL   Total Protein 4.3 (L) 6.5 - 8.1 g/dL   Albumin 2.4 (L) 3.5 - 5.0 g/dL   AST 22 15 - 41 U/L   ALT 14 0 - 44 U/L   Alkaline Phosphatase 49 38 - 126 U/L   Total Bilirubin 0.6 0.3 - 1.2 mg/dL   GFR, Estimated 19 (L) >60 mL/min    Comment: (NOTE) Calculated using the CKD-EPI Creatinine Equation (2021)    Anion gap 9 5 - 15    Comment: Performed at [facility], [address], [place], [place] [address]  Magnesium     Status: None   Collection Time: 06/15/23  5:04 AM  Result Value Ref Range   Magnesium 2.3 1.7 - 2.4 mg/dL    Comment: Performed at [facility], [address], [place], [place] [address]  CBC with Differential/Platelet     Status: Abnormal   Collection Time: 06/15/23  5:04 AM  Result Value Ref Range   WBC 4.2 4.0 - 10.5 K/uL   RBC 2.91 (L) 3.87 - 5.11 MIL/uL  Hemoglobin 9.2 (L) 12.0 - 15.0 g/dL   HCT 91.4 (L) 78.2 - 95.6 %   MCV 91.1 80.0 - 100.0 fL   MCH 31.6 26.0 - 34.0 pg   MCHC 34.7 30.0 - 36.0 g/dL   RDW 21.3 08.6 - 57.8 %   Platelets 185 150 - 400 K/uL   nRBC 0.0 0.0 - 0.2 %   Neutrophils Relative % 61 %   Neutro Abs 2.5 1.7 - 7.7 K/uL   Lymphocytes Relative 20 %   Lymphs Abs 0.8 0.7 - 4.0 K/uL   Monocytes Relative 16 %   Monocytes Absolute 0.7 0.1 - 1.0 K/uL   Eosinophils Relative 1 %   Eosinophils Absolute 0.1 0.0 - 0.5 K/uL   Basophils Relative 1 %   Basophils Absolute 0.0 0.0 - 0.1 K/uL   Immature Granulocytes 1 %   Abs Immature Granulocytes 0.02 0.00 - 0.07 K/uL    Comment: Performed at [facility], [address] [place], [place] [address]  TSH     Status: None   Collection Time: 06/15/23  5:04 AM  Result Value Ref Range   TSH 3.678 0.350 - 4.500 uIU/mL    Comment: Performed by a 3rd Generation assay with a functional sensitivity of <=0.01 uIU/mL. Performed at [facility], [address] [place], [place] [address]   TSH     Status: None   Collection Time: 06/15/23  8:35 AM  Result Value Ref Range   TSH 3.372 0.350 - 4.500 uIU/mL    Comment: Performed by a 3rd Generation assay with a functional sensitivity of <=0.01 uIU/mL. Performed at [facility], [address] [place], [place] [address]   Glucose, capillary     Status: Abnormal   Collection Time: 06/15/23 10:02 AM  Result Value Ref Range   Glucose-Capillary 215 (H) 70 - 99 mg/dL    Comment: Glucose reference range applies only to samples taken after fasting for at least 8 hours.  Basic metabolic panel     Status: Abnormal   Collection Time: 06/15/23 10:10 AM  Result Value Ref Range   Sodium 122 (L) 135 - 145 mmol/L   Potassium 3.2 (L) 3.5 - 5.1 mmol/L   Chloride 85 (L) 98 - 111 mmol/L   CO2 25 22 - 32 mmol/L   Glucose, Bld 218 (H) 70 - 99 mg/dL    Comment: Glucose reference range applies only to samples taken after fasting for at least 8 hours.   BUN 48  (H) 8 - 23 mg/dL   Creatinine, Ser 0.10 (H) 0.44 - 1.00 mg/dL   Calcium 8.2 (L) 8.9 - 10.3 mg/dL   GFR, Estimated 19 (L) >60 mL/min    Comment: (NOTE) Calculated using the CKD-EPI Creatinine Equation (2021)    Anion gap 12 5 - 15    Comment: Performed at [facility], [address] [place], [place] [address]   *Note: Due to a large number of results and/or encounters for the requested time period, some results have not been displayed. A complete set of results can be found in Results Review.    DG Chest 2 View  Result Date: 06/14/2023 CLINICAL DATA:  Dyspnea EXAM: CHEST - 2 VIEW COMPARISON:  06/09/2023 FINDINGS: Small bilateral pleural effusions are present, left greater than right. No confluent pulmonary infiltrate. No pneumothorax. Cardiac size is at the upper limits of normal. Pulmonary vascularity is normal. No acute bone abnormality. IMPRESSION: 1. Small bilateral pleural effusions, left greater than right. Electronically Signed  By: Helyn Numbers M.D.   On: 06/14/2023 18:53    Assessment/Plan  Hypervolemic hyponatremia:  in the setting of a CHF exac.  She is pretty volume overloaded.    - increase Lasix to 80 IV BID  - fluid restriction  - strict I/O  - daily weights  - d/c salt tabs- not indicated for correction of hyponatremia in volume overload  2.  AKI on CKD IV  - likely cardiorenal per notes  - follow  - will get bladder scans to make sure no urinary retention  3.  Hypokalemia:  - replete prn  4.  Dispo: inpt   Bufford Buttner 06/15/2023, 11:42 AM

## 2023-06-15 NOTE — TOC Initial Note (Signed)
Transition of Care [facility]) - Initial/Assessment Note    Patient Details  Name: Emily Jennings MRN: [id number] Date of Birth: [birth year]  [facility]) CM/SW Contact:    Leitha Bleak, RN Phone Number: 06/15/2023, 12:46 PM  Clinical Narrative:  Patient admitted with hyponatremia. Third admission this month. Per chart  she lives with her spouse and son. Her spouse is able to assist with ADLs if needed. Family provides transportation when needed. She is active with [facility].  Pt uses a walker in the home.  CHF education added to AVS. TOC to follow.                Expected Discharge Plan: Home w Home Health Services Barriers to Discharge: Continued Medical Work up  Patient Goals and CMS Choice       Activities of Daily Living   ADL Screening (condition at time of admission) Does the patient have a NEW difficulty with bathing/dressing/toileting/self-feeding that is expected to last >3 days?: No Does the patient have a NEW difficulty with getting in/out of bed, walking, or climbing stairs that is expected to last >3 days?: No Does the patient have a NEW difficulty with communication that is expected to last >3 days?: No Is the patient deaf or have difficulty hearing?: Yes Does the patient have difficulty seeing, even when wearing glasses/contacts?: No Does the patient have difficulty concentrating, remembering, or making decisions?: Yes  Permission Sought/Granted       Admission diagnosis:  Hyponatremia [E87.1] SOB (shortness of breath) [R06.02] Patient Active Problem List   Diagnosis Date Noted   Essential hypertension 06/15/2023   Acute on chronic renal failure (HCC) 06/02/2023   Acute diastolic CHF (congestive heart failure) (HCC) 06/01/2023   Cerebellar edema (HCC) 05/23/2023   Hyperlipidemia 05/23/2023   DNR (do not resuscitate) 05/17/2023   Vocal cord dysfunction 05/17/2023   DNR (do not resuscitate) discussion 05/17/2023   Goals of care,  counseling/discussion 05/17/2023   Right cerebellar intraparenchymal hemorrhage due to hypertension 05/16/2023   Unsteady gait when walking 04/03/2023   Hospital discharge follow-up 03/06/2023   Bilateral kidney stones 03/06/2023   Hip swelling, right 02/27/2023   DKA (diabetic ketoacidosis) (HCC) 02/17/2023   High anion gap metabolic acidosis 02/17/2023   Pseudohyponatremia 02/17/2023   Hypoalbuminemia due to protein-calorie malnutrition (HCC) 02/17/2023   Dehydration 02/17/2023   Hip pain 02/15/2023   Encounter for Medicare annual examination with abnormal findings 10/16/2022   Vitamin D deficiency 10/16/2022   Acute exacerbation of CHF (congestive heart failure) (HCC) 09/13/2022   Acute on chronic congestive heart failure with left ventricular diastolic dysfunction (HCC) 09/13/2022   Chronic kidney disease (CKD) stage G3b/A2, moderately decreased glomerular filtration rate (GFR) between 30-44 mL/min/1.73 square meter and albuminuria creatinine ratio between 30-299 mg/g (HCC) 09/13/2022   Mild aortic valve stenosis 09/13/2022   Dyspnea on exertion 09/05/2022   CHF (congestive heart failure) (HCC) 09/05/2022   Acute on chronic diastolic (congestive) heart failure (HCC) 09/05/2022   Hyperkalemia 04/09/2022   Encounter for support and coordination of transition of care 04/09/2022   Hyperglycemic crisis due to diabetes mellitus (HCC) 03/25/2022   Hyponatremia 03/25/2022   Acute metabolic encephalopathy 03/22/2022   Hypothermia 03/22/2022   Heart murmur 03/22/2022   Bilateral lower extremity edema 03/22/2022   Syncope and collapse 01/13/2021   Lip swelling 10/30/2020   CKD stage 4 due to type 2 diabetes mellitus (HCC) 10/30/2020   Tubular adenoma of colon 10/26/2017   Osteoporosis 07/28/2014  Diabetic hypertension-nephrosis syndrome (HCC) 03/23/2014   Type 2 diabetes mellitus with stage 3a chronic kidney disease, with long-term current use of insulin (HCC) 09/29/2013   Allergic  rhinitis 01/27/2013   GERD (gastroesophageal reflux disease) 01/12/2013   CVD (cardiovascular disease) 07/17/2011   Hypokalemia 08/31/2008   Hyperlipidemia LDL goal <100 07/15/2007   Resistant hypertension 07/15/2007   PCP:  [name], MD Pharmacy:   [facility] [phone] - [place],  - [address] AT [facility] [address] [place] [address] Phone: [phone] Fax: [phone]  [facility]) - [place], [place] - [address] [facility] [address] [place] [address] [place] [address] Phone: [phone] Fax: [phone]  [facility] [address] [place] [address] Phone: [phone] Fax: [phone]     Social Determinants of Health (SDOH) Social History: SDOH Screenings   Food Insecurity: No Food Insecurity (06/15/2023)  Housing: Low Risk  (06/15/2023)  Transportation Needs: No Transportation Needs (06/15/2023)  Utilities: Not At Risk (06/15/2023)  Alcohol Screen: Low Risk  (11/15/2022)  Depression (PHQ2-9): Low Risk  (06/14/2023)  Financial Resource Strain: Low Risk  (12/28/2022)  Physical Activity: Sufficiently Active (11/15/2022)  Social Connections: Moderately Integrated (11/15/2022)  Stress: No Stress Concern Present (12/28/2022)  Tobacco Use: Low Risk  (06/14/2023)   SDOH Interventions:    Readmission Risk Interventions    06/15/2023   12:46 PM 06/03/2023   11:29 AM 02/19/2023    8:06 AM  Readmission Risk Prevention Plan  Transportation Screening Complete Complete Complete  HRI or Home Care Consult  Complete Complete  Social Work Consult for Recovery Care Planning/Counseling  Complete Complete  Palliative Care Screening  Not Applicable Not Applicable  Medication Review Oceanographer) Complete Complete Complete  PCP or Specialist appointment within 3-5 days of discharge Not Complete    HRI or Home Care Consult Complete    SW Recovery Care/Counseling Consult Complete     Palliative Care Screening Not Applicable    Skilled Nursing Facility Not Applicable

## 2023-06-15 NOTE — Progress Notes (Signed)
ASSUMPTION OF CARE NOTE   06/15/2023 1:17 PM  Emily Jennings was seen and examined.  The H&P by the admitting provider, orders, imaging was reviewed.  Please see new orders.  Will continue to follow.   Vitals:   06/15/23 0745 06/15/23 0832  BP: (!) 156/70 (!) 156/67  Pulse: 70 76  Resp: 14 18  Temp:  97.8 F (36.6 C)  SpO2: 96% 99%    Results for orders placed or performed during the hospital encounter of 06/14/23  CBC with Differential  Result Value Ref Range   WBC 3.9 (L) 4.0 - 10.5 K/uL   RBC 3.58 (L) 3.87 - 5.11 MIL/uL   Hemoglobin 11.1 (L) 12.0 - 15.0 g/dL   HCT 75.6 (L) 43.3 - 29.5 %   MCV 91.3 80.0 - 100.0 fL   MCH 31.0 26.0 - 34.0 pg   MCHC 33.9 30.0 - 36.0 g/dL   RDW 18.8 41.6 - 60.6 %   Platelets 225 150 - 400 K/uL   nRBC 0.0 0.0 - 0.2 %   Neutrophils Relative % 73 %   Neutro Abs 2.9 1.7 - 7.7 K/uL   Lymphocytes Relative 14 %   Lymphs Abs 0.6 (L) 0.7 - 4.0 K/uL   Monocytes Relative 10 %   Monocytes Absolute 0.4 0.1 - 1.0 K/uL   Eosinophils Relative 1 %   Eosinophils Absolute 0.0 0.0 - 0.5 K/uL   Basophils Relative 1 %   Basophils Absolute 0.0 0.0 - 0.1 K/uL   Immature Granulocytes 1 %   Abs Immature Granulocytes 0.02 0.00 - 0.07 K/uL  Basic metabolic panel  Result Value Ref Range   Sodium 120 (L) 135 - 145 mmol/L   Potassium 3.6 3.5 - 5.1 mmol/L   Chloride 84 (L) 98 - 111 mmol/L   CO2 26 22 - 32 mmol/L   Glucose, Bld 196 (H) 70 - 99 mg/dL   BUN 47 (H) 8 - 23 mg/dL   Creatinine, Ser 3.01 (H) 0.44 - 1.00 mg/dL   Calcium 8.6 (L) 8.9 - 10.3 mg/dL   GFR, Estimated 17 (L) >60 mL/min   Anion gap 10 5 - 15  Brain natriuretic peptide  Result Value Ref Range   B Natriuretic Peptide 588.0 (H) 0.0 - 100.0 pg/mL  Comprehensive metabolic panel  Result Value Ref Range   Sodium 122 (L) 135 - 145 mmol/L   Potassium 2.9 (L) 3.5 - 5.1 mmol/L   Chloride 85 (L) 98 - 111 mmol/L   CO2 28 22 - 32 mmol/L   Glucose, Bld 159 (H) 70 - 99 mg/dL   BUN 49 (H) 8 - 23 mg/dL    Creatinine, Ser 6.01 (H) 0.44 - 1.00 mg/dL   Calcium 8.0 (L) 8.9 - 10.3 mg/dL   Total Protein 4.3 (L) 6.5 - 8.1 g/dL   Albumin 2.4 (L) 3.5 - 5.0 g/dL   AST 22 15 - 41 U/L   ALT 14 0 - 44 U/L   Alkaline Phosphatase 49 38 - 126 U/L   Total Bilirubin 0.6 0.3 - 1.2 mg/dL   GFR, Estimated 19 (L) >60 mL/min   Anion gap 9 5 - 15  Magnesium  Result Value Ref Range   Magnesium 2.3 1.7 - 2.4 mg/dL  CBC with Differential/Platelet  Result Value Ref Range   WBC 4.2 4.0 - 10.5 K/uL   RBC 2.91 (L) 3.87 - 5.11 MIL/uL   Hemoglobin 9.2 (L) 12.0 - 15.0 g/dL   HCT 09.3 (L) 23.5 - 57.3 %  MCV 91.1 80.0 - 100.0 fL   MCH 31.6 26.0 - 34.0 pg   MCHC 34.7 30.0 - 36.0 g/dL   RDW 16.1 09.6 - 04.5 %   Platelets 185 150 - 400 K/uL   nRBC 0.0 0.0 - 0.2 %   Neutrophils Relative % 61 %   Neutro Abs 2.5 1.7 - 7.7 K/uL   Lymphocytes Relative 20 %   Lymphs Abs 0.8 0.7 - 4.0 K/uL   Monocytes Relative 16 %   Monocytes Absolute 0.7 0.1 - 1.0 K/uL   Eosinophils Relative 1 %   Eosinophils Absolute 0.1 0.0 - 0.5 K/uL   Basophils Relative 1 %   Basophils Absolute 0.0 0.0 - 0.1 K/uL   Immature Granulocytes 1 %   Abs Immature Granulocytes 0.02 0.00 - 0.07 K/uL  TSH  Result Value Ref Range   TSH 3.678 0.350 - 4.500 uIU/mL  Basic metabolic panel  Result Value Ref Range   Sodium 122 (L) 135 - 145 mmol/L   Potassium 3.2 (L) 3.5 - 5.1 mmol/L   Chloride 85 (L) 98 - 111 mmol/L   CO2 25 22 - 32 mmol/L   Glucose, Bld 218 (H) 70 - 99 mg/dL   BUN 48 (H) 8 - 23 mg/dL   Creatinine, Ser 4.09 (H) 0.44 - 1.00 mg/dL   Calcium 8.2 (L) 8.9 - 10.3 mg/dL   GFR, Estimated 19 (L) >60 mL/min   Anion gap 12 5 - 15  TSH  Result Value Ref Range   TSH 3.372 0.350 - 4.500 uIU/mL  Glucose, capillary  Result Value Ref Range   Glucose-Capillary 215 (H) 70 - 99 mg/dL  Glucose, capillary  Result Value Ref Range   Glucose-Capillary 180 (H) 70 - 99 mg/dL  CBG monitoring, ED  Result Value Ref Range   Glucose-Capillary 208 (H) 70 - 99  mg/dL   *Note: Due to a large number of results and/or encounters for the requested time period, some results have not been displayed. A complete set of results can be found in Results Review.     Maryln Manuel, MD [facility]   06/14/2023  4:01 PM How to contact the [facility] Attending or Consulting provider 7A - 7P or covering provider during after hours 7P -7A, for this patient?  Check the care team in [facility] and look for a) attending/consulting TRH provider listed and b) the [facility] team listed Log into [email or web address] and use [place]'s universal password to access. If you do not have the password, please contact the hospital operator. Locate the [facility] provider you are looking for under [facility] and page to a number that you can be directly reached. If you still have difficulty reaching the provider, please page the [facility] (Director on Call) for the Hospitalists listed on amion for assistance.

## 2023-06-15 NOTE — Assessment & Plan Note (Signed)
-   Tresiba 5 units at baseline - No basal insulin while in the hospital - Sliding scale coverage - Monitor CBGs

## 2023-06-15 NOTE — H&P (Signed)
History and Physical    Patient: Emily Jennings:[id number] DOB: Jul 01, 1934 DOA: 06/14/2023 DOS: the patient was seen and examined on 06/15/2023 PCP: [name], MD  Patient coming from: Home  Chief Complaint:  Chief Complaint  Patient presents with   Shortness of Breath   HPI: Emily Jennings is a 89 y.o. female with medical history significant of anxiety, GERD, DMII, HTN, anxiety, and more presents to the ED with a chief complaint of dyspnea. Patient is not able to stay awake long enough to answer even a single question. After her heparin shot, she was slightly more alert, but she was trying to guess why she came into the hospital today when I asked her to tell me about it. I think she will be better able to discuss after she has had some rest. RN at beside reports that she was alert, oriented, and chatting earlier in the night. Anyways, from PCP note - it appears that after patient was seen on 9/21 in the ER, she was not able to increase her lasix from every other day to daily as she had been advised. She was short of breath at her PCP office, reportedly not speaking in full sentences. She complained that her legs and abdomen had fluid built up again. PCP advised her to present to ED. In the ED CXR shows BL pleural effusions. Her EKG was stable compared to previous. Her blood work did show an AKI. Admission was requested for inpatient diuresis.   Patient is full code by default because she is not awake enough to have this conversation at this time.   Review of Systems: unable to review all systems due to the inability of the patient to answer questions. Past Medical History:  Diagnosis Date   Allergy    Anxiety    ANXIETY DISORDER, GENERALIZED 07/15/2007   Qualifier: Diagnosis of  By: Chipper Herb     Arthritis    Cancer [facility]) 2009   breast, carcinoma in situ left   Carcinoma in situ of breast 05/21/2008   Qualifier: Diagnosis of  By: [name] MD, Margaret  Diagnosed in 2009,  completed 5 year course of tamoxifen, no evidence of recurrence    Carotid stenosis    11/16/2005  mild plaque formation and stenosis proximal right ECA   Cataract    Complication of anesthesia    Coronary artery disease    cardiac catheterization on 03/20/2006  LAD mid 40% stenosis, left circumflex mild 40% stenosis, RCA mid-vessel 40% to 50% lesion   EF 60%   Diabetes mellitus    GERD (gastroesophageal reflux disease)    Hernia, inguinal    left   Hyperglycemia    Hypertension    Insomnia 11/16/2011   Low blood potassium    Non-insulin dependent type 2 diabetes mellitus (HCC)    Osteoporosis    Shortness of breath    2D Echocardiogram 01/26/2009   EF of greater than 55%, mild MR, mild TR, normal ventricular function   Thickened endometrium 10/26/2017   Noted by gyne in 2017, missed 6 month follow up, referred in 09/2017   Ventricular tachycardia, non-sustained (HCC)    developed during stress test 02/08/2006, spontaneously aborted, mild reversible apical defect   Past Surgical History:  Procedure Laterality Date   BREAST LUMPECTOMY Left 2009   Left breast 2009   CATARACT EXTRACTION W/PHACO Left 10/28/2014   Procedure: PHACO EMULSION CATARACT EXTRACTION WITH INTRAOCULAR LENS IMPLANT LEFT EYE (IOC);  Surgeon: Chalmers Guest, MD;  Location: [facility] OR;  Service: Ophthalmology;  Laterality: Left;   COLONOSCOPY     cyst removed from left foot     REFRACTIVE SURGERY Left    Social History:  reports that she has never smoked. She has never been exposed to tobacco smoke. She has never used smokeless tobacco. She reports that she does not drink alcohol and does not use drugs.  Allergies  Allergen Reactions   Amlodipine Swelling   Benadryl [Diphenhydramine Hcl] Hypertension   Citalopram Other (See Comments)    unknown   Farxiga [Dapagliflozin] Other (See Comments)    DKA   Metformin And Related Diarrhea    Lost appetite and weight    Tramadol Other (See Comments)    Felt light headed and  dizzy   Crestor [Rosuvastatin] Other (See Comments)    "Feet swelling", makes her feel weak. Pt takes 5mg  at home    Family History  Problem Relation Age of Onset   Hypertension Mother    Hyperlipidemia Mother    Stroke Mother    Urticaria Mother    Cancer Father        pancreatic   Colon cancer Father    Heart disease Brother 40       bypass   Heart disease Brother 36       bypass   Arthritis Other    Asthma Other    Diabetes Other    Colon cancer Paternal Aunt    Esophageal cancer Neg Hx    Stomach cancer Neg Hx    Rectal cancer Neg Hx     Prior to Admission medications   Medication Sig Start Date End Date Taking? Authorizing Provider  calcitRIOL (ROCALTROL) 0.25 MCG capsule Take 0.25 mcg by mouth 3 (three) times a week. Patient not taking: Reported on 06/06/2023    [provider]  carvedilol (COREG) 25 MG tablet Take 1 tablet (25 mg total) by mouth 2 (two) times daily with a meal. 05/28/23   Danford, Earl Lites, MD  cholecalciferol (VITAMIN D3) 25 MCG (1000 UNIT) tablet Take 1,000 Units by mouth daily.    [provider]  furosemide (LASIX) 40 MG tablet Take 1 tablet (40 mg total) by mouth every other day. 06/05/23 06/04/24  Johnson, Clanford L, MD  hydrALAZINE (APRESOLINE) 100 MG tablet Take 1 tablet (100 mg total) by mouth 3 (three) times daily. 05/28/23   Danford, Earl Lites, MD  insulin degludec (TRESIBA FLEXTOUCH) 100 UNIT/ML FlexTouch Pen Inject 5 Units into the skin daily. 05/01/23   Shamleffer, Konrad Dolores, MD  insulin lispro (HUMALOG KWIKPEN) 100 UNIT/ML KwikPen Inject 5 Units into the skin 3 (three) times daily. Max daily 30 units 05/01/23   Shamleffer, Konrad Dolores, MD  NIFEdipine (ADALAT CC) 60 MG 24 hr tablet Take 1 tablet (60 mg total) by mouth daily. 05/29/23   Danford, Earl Lites, MD  potassium chloride (KLOR-CON M) 10 MEQ tablet Take 1 tablet (10 mEq total) by mouth every other day. Take only when taking furosemide 06/05/23   Johnson,  Clanford L, MD  rosuvastatin (CRESTOR) 5 MG tablet TAKE 1 TABLET (5 MG TOTAL) BY MOUTH DAILY. Patient taking differently: Take 5 mg by mouth daily. 01/15/23   [name], MD  VITAMIN A-BETA CAROTENE PO Take 1 tablet by mouth daily.    [provider]    Physical Exam: Vitals:   06/15/23 0000 06/15/23 0005 06/15/23 0215 06/15/23 0515  BP: (!) 169/63 (!) 183/74 (!) 153/68 (!) 161/67  Pulse:  77 77 74 67  Resp: 20 17 17 16   Temp:  97.7 F (36.5 C) 97.7 F (36.5 C)   TempSrc:  Oral Oral   SpO2: 96% 99% 96% 96%  Weight:      Height:       1.  General: Patient lying supine in bed,  no acute distress   2. Psychiatric: Somnolent and oriented x self, mood and behavior normal for situation, pleasant and cooperative with exam   3. Neurologic: Speech and language are normal, face is symmetric, moves all 4 extremities voluntarily, at baseline without acute deficits on limited exam   4. HEENMT:  Head is atraumatic, normocephalic, pupils reactive to light, neck is supple, trachea is midline, mucous membranes are moist   5. Respiratory : Lungs are clear to auscultation bilaterally without wheezing, rhonchi, rales, no cyanosis, no increase in work of breathing or accessory muscle use   6. Cardiovascular : Heart rate normal, rhythm is regular, no murmurs, rubs or gallops, significant peripheral edema, peripheral pulses palpated   7. Gastrointestinal:  Abdomen is soft, nondistended, nontender to palpation bowel sounds active, no masses or organomegaly palpated   8. Skin:  Skin is warm, dry and intact without rashes, acute lesions, or ulcers on limited exam   9.Musculoskeletal:  No acute deformities or trauma, no asymmetry in tone, significant peripheral edema, peripheral pulses palpated, no tenderness to palpation in the extremities  Data Reviewed: In the ED Patient is afebrile, with stable vital signs.  No leukocytosis, hgb stable Chem reveals AKI and  Hyponatremia CXR shows BL pleural effusions EKG is stable compared to previous Admission requested for inpatient diuresis  Assessment and Plan: * Hyponatremia - Sodium down to 120 - Hypervolemic hyponatremia - Continue diuresis  Acute on chronic congestive heart failure with left ventricular diastolic dysfunction (HCC) - Chest x-ray shows small bilateral pleural effusions, patient complains of dyspnea, patient has peripheral edema - Recently had a very similar admission and echo showed an ejection fraction of 65-70% and grade 2 diastolic dysfunction - Patient was recently seen in the ER for the same complaints and advised to take her Lasix every day instead of every other day, she was not able to make this change -Continue diuresis -monitor intake and output -Fluid restrictions -Continue to monitor  Essential hypertension - Continue Coreg and hydralazine  Acute on chronic renal failure (HCC) - Creatinine at baseline 2.1, creatinine today 2.59 - Likely cardiorenal - Continue diuresis - Trend in the a.m.  Hyperlipidemia - Continue Crestor  Type 2 diabetes mellitus with stage 3a chronic kidney disease, with long-term current use of insulin (HCC) - Tresiba 5 units at baseline - No basal insulin while in the hospital - Sliding scale coverage - Monitor CBGs      Advance Care Planning:   Code Status: Full Code   Consults: None at this time  Family Communication: No family at bedside  Severity of Illness: The appropriate patient status for this patient is INPATIENT. Inpatient status is judged to be reasonable and necessary in order to provide the required intensity of service to ensure the patient's safety. The patient's presenting symptoms, physical exam findings, and initial radiographic and laboratory data in the context of their chronic comorbidities is felt to place them at high risk for further clinical deterioration. Furthermore, it is not anticipated that the patient  will be medically stable for discharge from the hospital within 2 midnights of admission.   * I certify that at the point of  admission it is my clinical judgment that the patient will require inpatient hospital care spanning beyond 2 midnights from the point of admission due to high intensity of service, high risk for further deterioration and high frequency of surveillance required.*  Author: Lilyan Gilford, DO 06/15/2023 6:31 AM  For on call review [email or web address].

## 2023-06-16 DIAGNOSIS — I5033 Acute on chronic diastolic (congestive) heart failure: Secondary | ICD-10-CM | POA: Diagnosis not present

## 2023-06-16 DIAGNOSIS — E871 Hypo-osmolality and hyponatremia: Secondary | ICD-10-CM | POA: Diagnosis not present

## 2023-06-16 DIAGNOSIS — E785 Hyperlipidemia, unspecified: Secondary | ICD-10-CM | POA: Diagnosis not present

## 2023-06-16 DIAGNOSIS — N179 Acute kidney failure, unspecified: Secondary | ICD-10-CM | POA: Diagnosis not present

## 2023-06-16 LAB — GLUCOSE, CAPILLARY
Glucose-Capillary: 210 mg/dL — ABNORMAL HIGH (ref 70–99)
Glucose-Capillary: 222 mg/dL — ABNORMAL HIGH (ref 70–99)
Glucose-Capillary: 127 mg/dL — ABNORMAL HIGH (ref 70–99)
Glucose-Capillary: 206 mg/dL — ABNORMAL HIGH (ref 70–99)

## 2023-06-16 LAB — BASIC METABOLIC PANEL
Anion gap: 12 (ref 5–15)
Anion gap: 11 (ref 5–15)
BUN: 47 mg/dL — ABNORMAL HIGH (ref 8–23)
BUN: 46 mg/dL — ABNORMAL HIGH (ref 8–23)
CO2: 27 mmol/L (ref 22–32)
CO2: 29 mmol/L (ref 22–32)
Calcium: 8.1 mg/dL — ABNORMAL LOW (ref 8.9–10.3)
Calcium: 8.2 mg/dL — ABNORMAL LOW (ref 8.9–10.3)
Chloride: 88 mmol/L — ABNORMAL LOW (ref 98–111)
Chloride: 90 mmol/L — ABNORMAL LOW (ref 98–111)
Creatinine, Ser: 2.3 mg/dL — ABNORMAL HIGH (ref 0.44–1.00)
Creatinine, Ser: 2.38 mg/dL — ABNORMAL HIGH (ref 0.44–1.00)
GFR, Estimated: 20 mL/min — ABNORMAL LOW (ref >60)
GFR, Estimated: 19 mL/min — ABNORMAL LOW (ref >60)
Glucose, Bld: 145 mg/dL — ABNORMAL HIGH (ref 70–99)
Glucose, Bld: 247 mg/dL — ABNORMAL HIGH (ref 70–99)
Potassium: 2.7 mmol/L — CL (ref 3.5–5.1)
Potassium: 2.8 mmol/L — ABNORMAL LOW (ref 3.5–5.1)
Sodium: 127 mmol/L — ABNORMAL LOW (ref 135–145)
Sodium: 130 mmol/L — ABNORMAL LOW (ref 135–145)

## 2023-06-16 MED ORDER — POTASSIUM CHLORIDE CRYS ER 20 MEQ PO TBCR
40.0000 meq | EXTENDED_RELEASE_TABLET | Freq: Two times a day (BID) | ORAL | Status: AC
  Administered 2023-06-16 (×2): 40 meq via ORAL
  Filled 2023-06-16 (×2): qty 2

## 2023-06-16 MED ORDER — HYDRALAZINE HCL 20 MG/ML IJ SOLN
10.0000 mg | INTRAMUSCULAR | Status: DC | PRN
  Administered 2023-06-16 – 2023-06-17 (×3): 10 mg via INTRAVENOUS
  Filled 2023-06-16 (×3): qty 1

## 2023-06-16 MED ORDER — INSULIN GLARGINE-YFGN 100 UNIT/ML ~~LOC~~ SOLN
5.0000 [IU] | Freq: Every day | SUBCUTANEOUS | Status: DC
  Administered 2023-06-16: 5 [IU] via SUBCUTANEOUS
  Filled 2023-06-16 (×2): qty 0.05

## 2023-06-16 NOTE — Progress Notes (Signed)
PROGRESS NOTE   Emily Jennings  EAV:[id number] DOB: 1933/10/25 DOA: 06/14/2023 PCP: [name], MD   Chief Complaint  Patient presents with   Shortness of Breath   Level of care: Telemetry  Brief Admission History:   89 y.o. female with medical history significant of anxiety, GERD, DMII, HTN, anxiety, and more presents to the ED with a chief complaint of dyspnea. Patient is not able to stay awake long enough to answer even a single question. After her heparin shot, she was slightly more alert, but she was trying to guess why she came into the hospital today when I asked her to tell me about it. I think she will be better able to discuss after she has had some rest. RN at beside reports that she was alert, oriented, and chatting earlier in the night. Anyways, from PCP note - it appears that after patient was seen on 9/21 in the ER, she was not able to increase her lasix from every other day to daily as she had been advised. She was short of breath at her PCP office, reportedly not speaking in full sentences. She complained that her legs and abdomen had fluid built up again. PCP advised her to present to ED. In the ED CXR shows BL pleural effusions. Her EKG was stable compared to previous. Her blood work did show an AKI. Admission was requested for inpatient diuresis.      Assessment and Plan:  Hyponatremia - Sodium improved to 130 after diuresis - Hypervolemic hyponatremia - Continue diuresis  Essential hypertension - Continue Coreg and hydralazine  Acute on chronic renal failure  - Creatinine at baseline 2.1, creatinine today 2.38 - Likely cardiorenal - Continue diuresis - follow up repeat   Hyperlipidemia - Continue Crestor  Acute on chronic congestive heart failure with left ventricular diastolic dysfunction (H - Chest x-ray shows small bilateral pleural effusions, patient complains of dyspnea, patient has peripheral edema - Recently had a very similar admission and echo  showed an ejection fraction of 65-70% and grade 2 diastolic dysfunction - Patient was recently seen in the ER for the same complaints and advised to take her Lasix every day instead of every other day, she was not able to make this change -Continue diuresis -monitor intake and output -Fluid restrictions -Continue to monitor  Type 2 diabetes mellitus with stage 3a chronic kidney disease, with long-term current use of insulin  - add semglee 5 units daily - Sliding scale coverage - Monitor CBGs CBG (last 3)  Recent Labs    06/16/23 0725 06/16/23 1104 06/16/23 1619  GLUCAP 210* 222* 127*   DVT prophylaxis: SQ heparin Code Status: Full  Family Communication:  Disposition: anticipate home with [facility]   Consultants:  nephrology Procedures:   Antimicrobials:    Subjective: Pt reports that she was up urinating most of the night.   Objective: Vitals:   06/16/23 1032 06/16/23 1352 06/16/23 1409 06/16/23 1641  BP: (!) 191/73 (!) 194/67 (!) 182/78 (!) 180/72  Pulse:  66    Resp:  14    Temp:  98 F (36.7 C)    TempSrc:  Oral    SpO2:  99%    Weight:      Height:        Intake/Output Summary (Last 24 hours) at 06/16/2023 1807 Last data filed at 06/16/2023 1300 Gross per 24 hour  Intake 480 ml  Output 1850 ml  Net -1370 ml   [employer]   06/14/23  1557 06/16/23 0330  Weight: 59 kg 62.3 kg   Examination:  General exam: Appears calm and comfortable  Respiratory system: Clear to auscultation. Respiratory effort normal. Cardiovascular system: normal S1 & S2 heard. No JVD, murmurs, rubs, gallops or clicks. No pedal edema. Gastrointestinal system: Abdomen is nondistended, soft and nontender. No organomegaly or masses felt. Normal bowel sounds heard. Central nervous system: Alert and oriented. No focal neurological deficits. Extremities: Symmetric 5 x 5 power. Skin: No rashes, lesions or ulcers. Psychiatry: Judgement and insight appear normal. Mood & affect appropriate.    Data Reviewed: I have personally reviewed following labs and imaging studies  CBC: Recent Labs  Lab 06/14/23 1711 06/15/23 0504  WBC 3.9* 4.2  NEUTROABS 2.9 2.5  HGB 11.1* 9.2*  HCT 32.7* 26.5*  MCV 91.3 91.1  PLT 225 185    Basic Metabolic Panel: Recent Labs  Lab 06/15/23 0504 06/15/23 1010 06/15/23 1556 06/15/23 2209 06/16/23 0404 06/16/23 0959  NA 122* 122* 126* 126* 127* 130*  K 2.9* 3.2* 3.6 3.0* 2.7* 2.8*  CL 85* 85* 87* 88* 88* 90*  CO2 28 25 28 28 27 29   GLUCOSE 159* 218* 162* 184* 145* 247*  BUN 49* 48* 49* 47* 47* 46*  CREATININE 2.41* 2.46* 2.38* 2.42* 2.30* 2.38*  CALCIUM 8.0* 8.2* 8.3* 8.2* 8.1* 8.2*  MG 2.3  --   --   --   --   --    CBG: Recent Labs  Lab 06/15/23 1715 06/15/23 2107 06/16/23 0725 06/16/23 1104 06/16/23 1619  GLUCAP 144* 204* 210* 222* 127*   No results found for this or any previous visit (from the past 240 hour(s)).   Radiology Studies: No results found.  Scheduled Meds:  calcitRIOL  0.25 mcg Oral Once per day on Monday Wednesday Friday   carvedilol  25 mg Oral BID WC   furosemide  80 mg Intravenous BID   heparin  5,000 Units Subcutaneous Q8H   hydrALAZINE  100 mg Oral TID   insulin aspart  0-15 Units Subcutaneous TID WC   insulin aspart  0-5 Units Subcutaneous QHS   potassium chloride  40 mEq Oral BID   rosuvastatin  5 mg Oral Daily   Continuous Infusions:   LOS: 1 day   Time spent: 47 mins  Starsha Morning Laural Benes, MD How to contact the [facility] Attending or Consulting provider 7A - 7P or covering provider during after hours 7P -7A, for this patient?  Check the care team in [facility] and look for a) attending/consulting TRH provider listed and b) the [place] team listed Log into [email or web address] and use [place]'s universal password to access. If you do not have the password, please contact the hospital operator. Locate the [facility] provider you are looking for under [facility] and page to a number that you can be directly  reached. If you still have difficulty reaching the provider, please page the [facility] (Director on Call) for the Hospitalists listed on amion for assistance.  06/16/2023, 6:07 PM

## 2023-06-16 NOTE — Hospital Course (Signed)
89 y.o. female with medical history significant of anxiety, GERD, DMII, HTN, anxiety, and more presents to the ED with a chief complaint of dyspnea. Patient is not able to stay awake long enough to answer even a single question. After her heparin shot, she was slightly more alert, but she was trying to guess why she came into the hospital today when I asked her to tell me about it. I think she will be better able to discuss after she has had some rest. RN at beside reports that she was alert, oriented, and chatting earlier in the night. Anyways, from PCP note - it appears that after patient was seen on 9/21 in the ER, she was not able to increase her lasix from every other day to daily as she had been advised. She was short of breath at her PCP office, reportedly not speaking in full sentences. She complained that her legs and abdomen had fluid built up again. PCP advised her to present to ED. In the ED CXR shows BL pleural effusions. Her EKG was stable compared to previous. Her blood work did show an AKI. Admission was requested for inpatient diuresis.

## 2023-06-16 NOTE — Progress Notes (Signed)
Lab called critical lab potassium 2.7. Amion MD Laural Benes. New orders placed.

## 2023-06-16 NOTE — Progress Notes (Signed)
Patient alert and verbal, tolerated medications whole with no complaints. Ambulated with one assist during shift. Noted patients blood pressure elevated BP 194/67, rechecked at 1409 manually was 182/78. Patient reported she normally takes hydralazine with breakfast then after lunch and at bedtimes. MD Laural Benes made aware. Pharmacy contacted regarding changing times due to next dose not due until 1600. Patient given PO Hydralazine at 1452, rechecked blood pressure was 180/72. Patient verbalized no complaints. MD Laural Benes made aware. Patient given PRN IV Hydralazine at 1641. Rechecked patients blood pressure at 1800 prior to giving IV Lasix and PO Coreg 25 mg blood pressure was 173/73, pulse 68. MD Laural Benes made aware. No new orders.

## 2023-06-16 NOTE — Plan of Care (Signed)
  Problem: Education: Goal: Knowledge of disease or condition will improve Outcome: Progressing Goal: Knowledge of secondary prevention will improve (MUST DOCUMENT ALL) Outcome: Progressing Goal: Knowledge of patient specific risk factors will improve Loraine Leriche N/A or DELETE if not current risk factor) Outcome: Progressing   Problem: Intracerebral Hemorrhage Tissue Perfusion: Goal: Complications of Intracerebral Hemorrhage will be minimized Outcome: Progressing   Problem: Coping: Goal: Will verbalize positive feelings about self Outcome: Progressing Goal: Will identify appropriate support needs Outcome: Progressing   Problem: Health Behavior/Discharge Planning: Goal: Ability to manage health-related needs will improve Outcome: Progressing Goal: Goals will be collaboratively established with patient/family Outcome: Progressing   Problem: Self-Care: Goal: Ability to participate in self-care as condition permits will improve Outcome: Progressing Goal: Verbalization of feelings and concerns over difficulty with self-care will improve Outcome: Progressing Goal: Ability to communicate needs accurately will improve Outcome: Progressing   Problem: Nutrition: Goal: Risk of aspiration will decrease Outcome: Progressing Goal: Dietary intake will improve Outcome: Progressing   Problem: Education: Goal: Ability to demonstrate management of disease process will improve Outcome: Progressing Goal: Ability to verbalize understanding of medication therapies will improve Outcome: Progressing Goal: Individualized Educational Video(s) Outcome: Progressing   Problem: Activity: Goal: Capacity to carry out activities will improve Outcome: Progressing   Problem: Cardiac: Goal: Ability to achieve and maintain adequate cardiopulmonary perfusion will improve Outcome: Progressing   Problem: Education: Goal: Knowledge of General Education information will improve Description: Including pain  rating scale, medication(s)/side effects and non-pharmacologic comfort measures Outcome: Progressing   Problem: Health Behavior/Discharge Planning: Goal: Ability to manage health-related needs will improve Outcome: Progressing   Problem: Clinical Measurements: Goal: Ability to maintain clinical measurements within normal limits will improve Outcome: Progressing Goal: Will remain free from infection Outcome: Progressing Goal: Diagnostic test results will improve Outcome: Progressing Goal: Respiratory complications will improve Outcome: Progressing Goal: Cardiovascular complication will be avoided Outcome: Progressing   Problem: Activity: Goal: Risk for activity intolerance will decrease Outcome: Progressing   Problem: Nutrition: Goal: Adequate nutrition will be maintained Outcome: Progressing   Problem: Coping: Goal: Level of anxiety will decrease Outcome: Progressing   Problem: Elimination: Goal: Will not experience complications related to bowel motility Outcome: Progressing Goal: Will not experience complications related to urinary retention Outcome: Progressing   Problem: Pain Managment: Goal: General experience of comfort will improve Outcome: Progressing   Problem: Safety: Goal: Ability to remain free from injury will improve Outcome: Progressing   Problem: Skin Integrity: Goal: Risk for impaired skin integrity will decrease Outcome: Progressing

## 2023-06-16 NOTE — Progress Notes (Signed)
   06/16/23 0800  ReDS Vest / Clip  Station Marker A  Ruler Value 25  ReDS Value Range < 36  ReDS Actual Value 29

## 2023-06-17 DIAGNOSIS — E871 Hypo-osmolality and hyponatremia: Secondary | ICD-10-CM | POA: Diagnosis not present

## 2023-06-17 DIAGNOSIS — N179 Acute kidney failure, unspecified: Secondary | ICD-10-CM | POA: Diagnosis not present

## 2023-06-17 DIAGNOSIS — E785 Hyperlipidemia, unspecified: Secondary | ICD-10-CM | POA: Diagnosis not present

## 2023-06-17 DIAGNOSIS — I5033 Acute on chronic diastolic (congestive) heart failure: Secondary | ICD-10-CM | POA: Diagnosis not present

## 2023-06-17 LAB — BASIC METABOLIC PANEL
Anion gap: 10 (ref 5–15)
BUN: 42 mg/dL — ABNORMAL HIGH (ref 8–23)
CO2: 31 mmol/L (ref 22–32)
Calcium: 8.1 mg/dL — ABNORMAL LOW (ref 8.9–10.3)
Chloride: 90 mmol/L — ABNORMAL LOW (ref 98–111)
Creatinine, Ser: 2.05 mg/dL — ABNORMAL HIGH (ref 0.44–1.00)
GFR, Estimated: 23 mL/min — ABNORMAL LOW (ref >60)
Glucose, Bld: 96 mg/dL (ref 70–99)
Potassium: 2.8 mmol/L — ABNORMAL LOW (ref 3.5–5.1)
Sodium: 131 mmol/L — ABNORMAL LOW (ref 135–145)

## 2023-06-17 LAB — GLUCOSE, CAPILLARY
Glucose-Capillary: 90 mg/dL (ref 70–99)
Glucose-Capillary: 49 mg/dL — ABNORMAL LOW (ref 70–99)
Glucose-Capillary: 70 mg/dL (ref 70–99)
Glucose-Capillary: 275 mg/dL — ABNORMAL HIGH (ref 70–99)
Glucose-Capillary: 325 mg/dL — ABNORMAL HIGH (ref 70–99)
Glucose-Capillary: 171 mg/dL — ABNORMAL HIGH (ref 70–99)

## 2023-06-17 MED ORDER — NIFEDIPINE ER OSMOTIC RELEASE 30 MG PO TB24
60.0000 mg | ORAL_TABLET | Freq: Every day | ORAL | Status: DC
  Administered 2023-06-17 – 2023-06-18 (×2): 60 mg via ORAL
  Filled 2023-06-17 (×2): qty 2

## 2023-06-17 MED ORDER — POTASSIUM CHLORIDE CRYS ER 20 MEQ PO TBCR
40.0000 meq | EXTENDED_RELEASE_TABLET | Freq: Three times a day (TID) | ORAL | Status: AC
  Administered 2023-06-17 – 2023-06-18 (×3): 40 meq via ORAL
  Filled 2023-06-17 (×3): qty 2

## 2023-06-17 MED ORDER — INSULIN ASPART 100 UNIT/ML IJ SOLN
0.0000 [IU] | Freq: Three times a day (TID) | INTRAMUSCULAR | Status: DC
  Administered 2023-06-17: 7 [IU] via SUBCUTANEOUS
  Administered 2023-06-17: 5 [IU] via SUBCUTANEOUS

## 2023-06-17 MED ORDER — FUROSEMIDE 40 MG PO TABS
40.0000 mg | ORAL_TABLET | Freq: Every day | ORAL | Status: DC
  Administered 2023-06-17 – 2023-06-18 (×2): 40 mg via ORAL
  Filled 2023-06-17 (×2): qty 1

## 2023-06-17 MED ORDER — INSULIN ASPART 100 UNIT/ML IJ SOLN: 0.0000 [IU] | Freq: Three times a day (TID) | INTRAMUSCULAR | Status: DC

## 2023-06-17 MED ORDER — POTASSIUM CHLORIDE CRYS ER 20 MEQ PO TBCR: 60.0000 meq | EXTENDED_RELEASE_TABLET | Freq: Once | ORAL | Status: DC

## 2023-06-17 NOTE — Plan of Care (Signed)

## 2023-06-17 NOTE — Progress Notes (Signed)
PROGRESS NOTE   Emily Jennings  YNW:[id number] DOB: March 27, 1934 DOA: 06/14/2023 PCP: [name], MD   Chief Complaint  Patient presents with   Shortness of Breath   Level of care: Telemetry  Brief Admission History:   89 y.o. female with medical history significant of anxiety, GERD, DMII, HTN, anxiety, and more presents to the ED with a chief complaint of dyspnea. Patient is not able to stay awake long enough to answer even a single question. After her heparin shot, she was slightly more alert, but she was trying to guess why she came into the hospital today when I asked her to tell me about it. I think she will be better able to discuss after she has had some rest. RN at beside reports that she was alert, oriented, and chatting earlier in the night. Anyways, from PCP note - it appears that after patient was seen on 9/21 in the ER, she was not able to increase her lasix from every other day to daily as she had been advised. She was short of breath at her PCP office, reportedly not speaking in full sentences. She complained that her legs and abdomen had fluid built up again. PCP advised her to present to ED. In the ED CXR shows BL pleural effusions. Her EKG was stable compared to previous. Her blood work did show an AKI. Admission was requested for inpatient diuresis.      Assessment and Plan:  Hyponatremia - improving - Sodium improved to 131 after IV furosemide diuresis - Hypervolemic hyponatremia - Continue diuresis  Essential hypertension - Continue Coreg and hydralazine -- resumed home procardia XL 60 mg   Acute on chronic renal failure  - Creatinine at baseline 2.1, creatinine 2.02  - Likely cardiorenal - Continue diuresis - follow up repeat   Hyperlipidemia - Continue Crestor  Acute on chronic congestive heart failure with left ventricular diastolic dysfunction - Chest x-ray shows small bilateral pleural effusions, patient complains of dyspnea, patient has peripheral  edema - Recently had a very similar admission and echo showed an ejection fraction of 65-70% and grade 2 diastolic dysfunction - Patient was recently seen in the ER for the same complaints and advised to take her Lasix every day instead of every other day, she was not able to make this change -IV lasix 80 mg BID until edema resolves, then resume home oral lasix 40 mg daily and monitor response -monitor intake and output -Fluid restrictions -Continue to monitor  Type 2 diabetes mellitus with stage 3a chronic kidney disease, with long-term current use of insulin  - add semglee 5 units daily - Sliding scale coverage - Monitor CBGs CBG (last 3)  Recent Labs    06/17/23 0353 06/17/23 0734 06/17/23 0757  GLUCAP 90 49* 70   Hypoglycemia from Insulin  - DC basal insulin  - reduced SSI coverage to (renal)  - continue CBG 5x daily   DVT prophylaxis: SQ heparin Code Status: Full  Family Communication:  Disposition: anticipate home with [facility]   Consultants:  nephrology Procedures:   Antimicrobials:    Subjective: Still having some SOB but edema is improved.     Objective: Vitals:   06/17/23 0401 06/17/23 0414 06/17/23 0630 06/17/23 0808  BP:  (!) 190/73 (!) 179/68 (!) 156/65  Pulse:  65  69  Resp:  16    Temp:  98.2 F (36.8 C)    TempSrc:  Oral    SpO2:  96%    Weight: 58.4  kg     Height:        Intake/Output Summary (Last 24 hours) at 06/17/2023 1132 Last data filed at 06/17/2023 0413 Gross per 24 hour  Intake 600 ml  Output 1000 ml  Net -400 ml   Filed Weights   06/14/23 1557 06/16/23 0330 06/17/23 0401  Weight: 59 kg 62.3 kg 58.4 kg   Examination:  General exam: Appears calm and comfortable  Respiratory system: Clear to auscultation. Respiratory effort normal. Cardiovascular system: normal S1 & S2 heard. No JVD, murmurs, rubs, gallops or clicks. No pedal edema. Gastrointestinal system: Abdomen is nondistended, soft and nontender. No organomegaly or masses felt.  Normal bowel sounds heard. Central nervous system: Alert and oriented. No focal neurological deficits. Extremities: Symmetric 5 x 5 power. Skin: No rashes, lesions or ulcers. Psychiatry: Judgement and insight appear normal. Mood & affect appropriate.   Data Reviewed: I have personally reviewed following labs and imaging studies  CBC: Recent Labs  Lab 06/14/23 1711 06/15/23 0504  WBC 3.9* 4.2  NEUTROABS 2.9 2.5  HGB 11.1* 9.2*  HCT 32.7* 26.5*  MCV 91.3 91.1  PLT 225 185    Basic Metabolic Panel: Recent Labs  Lab 06/15/23 0504 06/15/23 1010 06/15/23 1556 06/15/23 2209 06/16/23 0404 06/16/23 0959 06/17/23 0816  NA 122*   < > 126* 126* 127* 130* 131*  K 2.9*   < > 3.6 3.0* 2.7* 2.8* 2.8*  CL 85*   < > 87* 88* 88* 90* 90*  CO2 28   < > 28 28 27 29 31   GLUCOSE 159*   < > 162* 184* 145* 247* 96  BUN 49*   < > 49* 47* 47* 46* 42*  CREATININE 2.41*   < > 2.38* 2.42* 2.30* 2.38* 2.05*  CALCIUM 8.0*   < > 8.3* 8.2* 8.1* 8.2* 8.1*  MG 2.3  --   --   --   --   --   --    < > = values in this interval not displayed.   CBG: Recent Labs  Lab 06/16/23 1619 06/16/23 2042 06/17/23 0353 06/17/23 0734 06/17/23 0757  GLUCAP 127* 206* 90 49* 70   No results found for this or any previous visit (from the past 240 hour(s)).   Radiology Studies: No results found.  Scheduled Meds:  calcitRIOL  0.25 mcg Oral Once per day on Monday Wednesday Friday   carvedilol  25 mg Oral BID WC   furosemide  40 mg Oral Daily   heparin  5,000 Units Subcutaneous Q8H   hydrALAZINE  100 mg Oral TID   insulin aspart  0-9 Units Subcutaneous TID WC   potassium chloride  60 mEq Oral Once   rosuvastatin  5 mg Oral Daily   Continuous Infusions:   LOS: 2 days   Time spent: 44 mins  Cleto Claggett Laural Benes, MD How to contact the [facility] Attending or Consulting provider 7A - 7P or covering provider during after hours 7P -7A, for this patient?  Check the care team in [facility] and look for a) attending/consulting  TRH provider listed and b) the [facility] team listed Log into [email or web address] and use [place]'s universal password to access. If you do not have the password, please contact the hospital operator. Locate the [facility] provider you are looking for under [facility] and page to a number that you can be directly reached. If you still have difficulty reaching the provider, please page the [facility] (Director on Call) for the Hospitalists listed on  amion for assistance.  06/17/2023, 11:32 AM

## 2023-06-18 ENCOUNTER — Ambulatory Visit: Payer: Medicare Other | Admitting: Cardiology

## 2023-06-18 ENCOUNTER — Inpatient Hospital Stay (HOSPITAL_COMMUNITY): Payer: Medicare Other

## 2023-06-18 DIAGNOSIS — E1122 Type 2 diabetes mellitus with diabetic chronic kidney disease: Secondary | ICD-10-CM | POA: Diagnosis not present

## 2023-06-18 DIAGNOSIS — N179 Acute kidney failure, unspecified: Secondary | ICD-10-CM | POA: Diagnosis not present

## 2023-06-18 DIAGNOSIS — I5033 Acute on chronic diastolic (congestive) heart failure: Secondary | ICD-10-CM | POA: Diagnosis not present

## 2023-06-18 DIAGNOSIS — E871 Hypo-osmolality and hyponatremia: Secondary | ICD-10-CM | POA: Diagnosis not present

## 2023-06-18 LAB — GLUCOSE, RANDOM: Glucose, Bld: 479 mg/dL — ABNORMAL HIGH (ref 70–99)

## 2023-06-18 LAB — RENAL FUNCTION PANEL
Albumin: 2.7 g/dL — ABNORMAL LOW (ref 3.5–5.0)
Anion gap: 11 (ref 5–15)
BUN: 41 mg/dL — ABNORMAL HIGH (ref 8–23)
CO2: 29 mmol/L (ref 22–32)
Calcium: 8.2 mg/dL — ABNORMAL LOW (ref 8.9–10.3)
Chloride: 91 mmol/L — ABNORMAL LOW (ref 98–111)
Creatinine, Ser: 2.09 mg/dL — ABNORMAL HIGH (ref 0.44–1.00)
GFR, Estimated: 23 mL/min — ABNORMAL LOW (ref >60)
Glucose, Bld: 379 mg/dL — ABNORMAL HIGH (ref 70–99)
Phosphorus: 2.5 mg/dL (ref 2.5–4.6)
Potassium: 3.6 mmol/L (ref 3.5–5.1)
Sodium: 131 mmol/L — ABNORMAL LOW (ref 135–145)

## 2023-06-18 LAB — GLUCOSE, CAPILLARY
Glucose-Capillary: 301 mg/dL — ABNORMAL HIGH (ref 70–99)
Glucose-Capillary: 424 mg/dL — ABNORMAL HIGH (ref 70–99)
Glucose-Capillary: 405 mg/dL — ABNORMAL HIGH (ref 70–99)
Glucose-Capillary: 463 mg/dL — ABNORMAL HIGH (ref 70–99)
Glucose-Capillary: 384 mg/dL — ABNORMAL HIGH (ref 70–99)
Glucose-Capillary: 282 mg/dL — ABNORMAL HIGH (ref 70–99)

## 2023-06-18 LAB — MAGNESIUM: Magnesium: 1.9 mg/dL (ref 1.7–2.4)

## 2023-06-18 MED ORDER — INSULIN ASPART 100 UNIT/ML IJ SOLN
10.0000 [IU] | Freq: Once | INTRAMUSCULAR | Status: AC
  Administered 2023-06-18: 10 [IU] via SUBCUTANEOUS

## 2023-06-18 MED ORDER — FUROSEMIDE 40 MG PO TABS
40.0000 mg | ORAL_TABLET | Freq: Every day | ORAL | 1 refills | Status: DC
Start: 2023-06-18 — End: 2023-07-06

## 2023-06-18 MED ORDER — INSULIN ASPART 100 UNIT/ML IJ SOLN: 2.0000 [IU] | Freq: Three times a day (TID) | INTRAMUSCULAR | Status: DC

## 2023-06-18 NOTE — Plan of Care (Signed)
  Problem: Coping: Goal: Will verbalize positive feelings about self Outcome: Progressing Goal: Will identify appropriate support needs Outcome: Progressing   Problem: Health Behavior/Discharge Planning: Goal: Ability to manage health-related needs will improve Outcome: Progressing Goal: Goals will be collaboratively established with patient/family Outcome: Progressing   Problem: Self-Care: Goal: Ability to communicate needs accurately will improve Outcome: Progressing   Problem: Nutrition: Goal: Adequate nutrition will be maintained Outcome: Progressing   Problem: Coping: Goal: Level of anxiety will decrease Outcome: Progressing

## 2023-06-18 NOTE — TOC Progression Note (Signed)
Transition of Care [facility]) - Progression Note    Patient Details  Name: Emily Jennings MRN: [id number] Date of Birth: Sep 13, 1934  [facility]) CM/SW Contact  Leitha Bleak, RN Phone Number: 06/18/2023, 1:11 PM  Clinical Narrative:   Discharging home, patient is  actively involved with community RNCM will collaborate on d/c disposition per [facility] liaison)    Expected Discharge Plan: Home w Home Health Services Barriers to Discharge: No Barriers Identified  Expected Discharge Plan and Services        Expected Discharge Date: 06/18/23                 Social Determinants of Health (SDOH) Interventions SDOH Screenings   Food Insecurity: No Food Insecurity (06/15/2023)  Housing: Low Risk  (06/15/2023)  Transportation Needs: No Transportation Needs (06/15/2023)  Utilities: Not At Risk (06/15/2023)  Alcohol Screen: Low Risk  (11/15/2022)  Depression (PHQ2-9): Low Risk  (06/14/2023)  Financial Resource Strain: Low Risk  (12/28/2022)  Physical Activity: Sufficiently Active (11/15/2022)  Social Connections: Moderately Integrated (11/15/2022)  Stress: No Stress Concern Present (12/28/2022)  Tobacco Use: Low Risk  (06/14/2023)    Readmission Risk Interventions    06/15/2023   12:46 PM 06/03/2023   11:29 AM 02/19/2023    8:06 AM  Readmission Risk Prevention Plan  Transportation Screening Complete Complete Complete  HRI or Home Care Consult  Complete Complete  Social Work Consult for Recovery Care Planning/Counseling  Complete Complete  Palliative Care Screening  Not Applicable Not Applicable  Medication Review Oceanographer) Complete Complete Complete  PCP or Specialist appointment within 3-5 days of discharge Not Complete    HRI or Home Care Consult Complete    SW Recovery Care/Counseling Consult Complete    Palliative Care Screening Not Applicable    Skilled Nursing Facility Not Applicable

## 2023-06-18 NOTE — Inpatient Diabetes Management (Signed)
Inpatient Diabetes Program Recommendations  AACE/ADA: New Consensus Statement on Inpatient Glycemic Control   Target Ranges:  Prepandial:   less than 140 mg/dL      Peak postprandial:   less than 180 mg/dL (1-2 hours)      Critically ill patients:  140 - 180 mg/dL    Latest Reference Range & Units 06/18/23 04:50  Glucose 70 - 99 mg/dL 161 (H)    Latest Reference Range & Units 06/17/23 03:53 06/17/23 07:34 06/17/23 07:57 06/17/23 11:30 06/17/23 16:16 06/17/23 21:11 06/18/23 02:37  Glucose-Capillary 70 - 99 mg/dL 90 49 (L) 70 096 (H) 045 (H) 171 (H) 301 (H)    Review of Glycemic Control  Diabetes history: DM2 Outpatient Diabetes medications: Tresiba 5 units daily, Humalog 5 units TID with meals Current orders for Inpatient glycemic control: Novolog 0-9 units TID with meals  Inpatient Diabetes Program Recommendations:    Insulin: Noted Semglee 5 units was given at 21:41 on 06/16/23 and glucose down to 49 mg/dl on 12/26/79 at 1:91 am. Lab glucose up to 379 mg/dl at 4:78 am today. May want to consider ordering Semglee 4 units Q24H and Novolog 2 units TID with meals for meal coverage if patient eats at least 50% of meals.   Thanks, Orlando Penner, RN, MSN, CDCES Diabetes Coordinator Inpatient Diabetes Program [phone] (Team Pager from 8am to 5pm)

## 2023-06-18 NOTE — Progress Notes (Deleted)
Clinical Summary Ms. Yearick is a 89 y.o.female   seen today as a focused follow up for history of chronic HFpEF.    1. Chronic HFpEF - -08/2022 echo: LVEF 60-65%, grade II dd, - admit 08/2022 with volume overload - home weight 122 lbs -some LE edema mild, no SOB/DOE. No orthopnea - takes lasix 40mg  once every 2 days.   - admit this month with acute chronic HFpEF complicated by hypervolemic hyponatremia -      2.HTN - home bps 130s/70s - reports some white coat HTN   3. CKD IV   4. DM2  5. LE edema - norvasc lowered to 5mg  daily to see if would help  6.Aortic stenosis - 04/2023 echo: LVEF 65-70%, no WMAs, grade II dd, AV mean grad 18.5 AVA VTI 1.33   7. HLD Past Medical History:  Diagnosis Date   Allergy    Anxiety    ANXIETY DISORDER, GENERALIZED 07/15/2007   Qualifier: Diagnosis of  By: Chipper Herb     Arthritis    Cancer [facility]) 2009   breast, carcinoma in situ left   Carcinoma in situ of breast 05/21/2008   Qualifier: Diagnosis of  By: [name] MD, Margaret  Diagnosed in 2009, completed 5 year course of tamoxifen, no evidence of recurrence    Carotid stenosis    11/16/2005  mild plaque formation and stenosis proximal right ECA   Cataract    Complication of anesthesia    Coronary artery disease    cardiac catheterization on 03/20/2006  LAD mid 40% stenosis, left circumflex mild 40% stenosis, RCA mid-vessel 40% to 50% lesion   EF 60%   Diabetes mellitus    GERD (gastroesophageal reflux disease)    Hernia, inguinal    left   Hyperglycemia    Hypertension    Insomnia 11/16/2011   Low blood potassium    Non-insulin dependent type 2 diabetes mellitus (HCC)    Osteoporosis    Shortness of breath    2D Echocardiogram 01/26/2009   EF of greater than 55%, mild MR, mild TR, normal ventricular function   Thickened endometrium 10/26/2017   Noted by gyne in 2017, missed 6 month follow up, referred in 09/2017   Ventricular tachycardia, non-sustained (HCC)     developed during stress test 02/08/2006, spontaneously aborted, mild reversible apical defect     Allergies  Allergen Reactions   Amlodipine Swelling   Benadryl [Diphenhydramine Hcl] Hypertension   Citalopram Other (See Comments)    unknown   Farxiga [Dapagliflozin] Other (See Comments)    DKA   Metformin And Related Diarrhea    Lost appetite and weight    Tramadol Other (See Comments)    Felt light headed and dizzy   Crestor [Rosuvastatin] Other (See Comments)    "Feet swelling", makes her feel weak. Pt takes 5mg  at home     No current facility-administered medications for this visit.   Current Outpatient Medications  Medication Sig Dispense Refill   furosemide (LASIX) 40 MG tablet Take 1 tablet (40 mg total) by mouth daily. 30 tablet 1   Facility-Administered Medications Ordered in Other Visits  Medication Dose Route Frequency Provider Last Rate Last Admin   acetaminophen (TYLENOL) tablet 650 mg  650 mg Oral Q6H PRN Zierle-Ghosh, Asia B, DO       Or   acetaminophen (TYLENOL) suppository 650 mg  650 mg Rectal Q6H PRN Zierle-Ghosh, Asia B, DO       calcitRIOL (ROCALTROL) capsule 0.25  mcg  0.25 mcg Oral Once per day on Monday Wednesday Friday Zierle-Ghosh, [place] B, DO   0.25 mcg at 06/18/23 1610   carvedilol (COREG) tablet 25 mg  25 mg Oral BID WC Zierle-Ghosh, Asia B, DO   25 mg at 06/18/23 9604   furosemide (LASIX) tablet 40 mg  40 mg Oral Daily Johnson, Clanford L, MD   40 mg at 06/18/23 0921   heparin injection 5,000 Units  5,000 Units Subcutaneous Q8H Zierle-Ghosh, Asia B, DO   5,000 Units at 06/18/23 0617   hydrALAZINE (APRESOLINE) injection 10 mg  10 mg Intravenous Q4H PRN Laural Benes, Clanford L, MD   10 mg at 06/17/23 0443   hydrALAZINE (APRESOLINE) tablet 100 mg  100 mg Oral TID Zierle-Ghosh, Asia B, DO   100 mg at 06/18/23 5409   insulin aspart (novoLOG) injection 0-9 Units  0-9 Units Subcutaneous TID WC Orson Aloe, RPH   7 Units at 06/17/23 1659   insulin aspart  (novoLOG) injection 2 Units  2 Units Subcutaneous TID WC Johnson, Clanford L, MD       NIFEdipine (PROCARDIA-XL/NIFEDICAL-XL) 24 hr tablet 60 mg  60 mg Oral Daily Johnson, Clanford L, MD   60 mg at 06/18/23 0921   ondansetron (ZOFRAN) tablet 4 mg  4 mg Oral Q6H PRN Zierle-Ghosh, Asia B, DO       Or   ondansetron (ZOFRAN) injection 4 mg  4 mg Intravenous Q6H PRN Zierle-Ghosh, Asia B, DO       oxyCODONE (Oxy IR/ROXICODONE) immediate release tablet 5 mg  5 mg Oral Q4H PRN Zierle-Ghosh, Asia B, DO       rosuvastatin (CRESTOR) tablet 5 mg  5 mg Oral Daily Zierle-Ghosh, Asia B, DO   5 mg at 06/18/23 8119     Past Surgical History:  Procedure Laterality Date   BREAST LUMPECTOMY Left 2009   Left breast 2009   CATARACT EXTRACTION W/PHACO Left 10/28/2014   Procedure: PHACO EMULSION CATARACT EXTRACTION WITH INTRAOCULAR LENS IMPLANT LEFT EYE (IOC);  Surgeon: Chalmers Guest, MD;  Location: [facility] OR;  Service: Ophthalmology;  Laterality: Left;   COLONOSCOPY     cyst removed from left foot     REFRACTIVE SURGERY Left      Allergies  Allergen Reactions   Amlodipine Swelling   Benadryl [Diphenhydramine Hcl] Hypertension   Citalopram Other (See Comments)    unknown   Farxiga [Dapagliflozin] Other (See Comments)    DKA   Metformin And Related Diarrhea    Lost appetite and weight    Tramadol Other (See Comments)    Felt light headed and dizzy   Crestor [Rosuvastatin] Other (See Comments)    "Feet swelling", makes her feel weak. Pt takes 5mg  at home      Family History  Problem Relation Age of Onset   Hypertension Mother    Hyperlipidemia Mother    Stroke Mother    Urticaria Mother    Cancer Father        pancreatic   Colon cancer Father    Heart disease Brother 40       bypass   Heart disease Brother 25       bypass   Arthritis Other    Asthma Other    Diabetes Other    Colon cancer Paternal Aunt    Esophageal cancer Neg Hx    Stomach cancer Neg Hx    Rectal cancer Neg Hx       Social History Ms. Rayon reports  that she has never smoked. She has never been exposed to tobacco smoke. She has never used smokeless tobacco. Ms. Lovecchio reports no history of alcohol use.   Review of Systems CONSTITUTIONAL: No weight loss, fever, chills, weakness or fatigue.  HEENT: Eyes: No visual loss, blurred vision, double vision or yellow sclerae.No hearing loss, sneezing, congestion, runny nose or sore throat.  SKIN: No rash or itching.  CARDIOVASCULAR:  RESPIRATORY: No shortness of breath, cough or sputum.  GASTROINTESTINAL: No anorexia, nausea, vomiting or diarrhea. No abdominal pain or blood.  GENITOURINARY: No burning on urination, no polyuria NEUROLOGICAL: No headache, dizziness, syncope, paralysis, ataxia, numbness or tingling in the extremities. No change in bowel or bladder control.  MUSCULOSKELETAL: No muscle, back pain, joint pain or stiffness.  LYMPHATICS: No enlarged nodes. No history of splenectomy.  PSYCHIATRIC: No history of depression or anxiety.  ENDOCRINOLOGIC: No reports of sweating, cold or heat intolerance. No polyuria or polydipsia.  Marland Kitchen   Physical Examination There were no vitals filed for this visit. There were no vitals filed for this visit.  Gen: resting comfortably, no acute distress HEENT: no scleral icterus, pupils equal round and reactive, no palptable cervical adenopathy,  CV Resp: Clear to auscultation bilaterally GI: abdomen is soft, non-tender, non-distended, normal bowel sounds, no hepatosplenomegaly MSK: extremities are warm, no edema.  Skin: warm, no rash Neuro:  no focal deficits Psych: appropriate affect   Diagnostic Studies 08/2022 echo 1. Left ventricular ejection fraction, by estimation, is 60 to 65%. The  left ventricle has normal function. The left ventricle has no regional  wall motion abnormalities. There is mild left ventricular hypertrophy.  Left ventricular diastolic parameters  are consistent with Grade II diastolic  dysfunction (pseudonormalization).  Elevated left ventricular end-diastolic pressure.   2. Right ventricular systolic function is normal. The right ventricular  size is normal. There is moderately elevated pulmonary artery systolic  pressure.   3. Right atrial size was mildly dilated.   4. 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 The mitral valve is abnormal. Trivial mitral valve  regurgitation. No evidence of mitral stenosis.   5. Higher gradients likely from high CO DVI 0.69 and AVA aas high as 1.95  by VTI suggests not truly moderate AS Acceleration time also fairly normal  . The aortic valve is tricuspid. There is moderate calcification of the  aortic valve. There is moderate  thickening of the aortic valve. Aortic valve regurgitation is not  visualized. Mild aortic valve stenosis.   6. The inferior vena cava is dilated in size with >50% respiratory  variability, suggesting right atrial pressure of 8 mmHg.     Assessment and Plan   1.Chronic HFpEF - home weights stable 120-122, no cardiopulmnoary symptoms - she has cut back on her lasix essentially taking 40mg  every 2 days, on this regimen she has actually done well. Cr has improved since January when she was likely overdiursed. She is vigilant about checking weights and will take additional prn lasix based on weight gain, ok with her self adjusting her diuretic at this time   2. HTN - history of whtie coat HTN - home bp's at goal, continue current meds     Antoine Poche, M.D., F.A.C.C.

## 2023-06-18 NOTE — Progress Notes (Signed)
Nurse at bedside, patient's blood pressure 126/53, Dr Laural Benes notified. Holding Hydralazine at this time per MD 's order. Plan of care on going .

## 2023-06-18 NOTE — Discharge Instructions (Signed)
IMPORTANT INFORMATION: PAY CLOSE ATTENTION   PHYSICIAN DISCHARGE INSTRUCTIONS  Follow with Primary care provider  [name], MD  and other consultants as instructed by your Hospitalist Physician  [place] IF SYMPTOMS COME BACK, WORSEN OR NEW PROBLEM DEVELOPS   Please note: You were cared for by a hospitalist during your hospital stay. Every effort will be made to forward records to your primary care provider.  You can request that your primary care provider send for your hospital records if they have not received them.  Once you are discharged, your primary care physician will handle any further medical issues. Please note that NO REFILLS for any discharge medications will be authorized once you are discharged, as it is imperative that you return to your primary care physician (or establish a relationship with a primary care physician if you do not have one) for your post hospital discharge needs so that they can reassess your need for medications and monitor your lab values.  Please get a complete blood count and chemistry panel checked by your Primary MD at your next visit, and again as instructed by your Primary MD.  Get Medicines reviewed and adjusted: Please take all your medications with you for your next visit with your Primary MD  Laboratory/radiological data: Please request your Primary MD to go over all hospital tests and procedure/radiological results at the follow up, please ask your primary care provider to get all Hospital records sent to his/her office.  In some cases, they will be blood work, cultures and biopsy results pending at the time of your discharge. Please request that your primary care provider follow up on these results.  If you are diabetic, please bring your blood sugar readings with you to your follow up appointment with primary care.    Please call and make your follow up appointments as soon as possible.    Also  Note the following: If you experience worsening of your admission symptoms, develop shortness of breath, life threatening emergency, suicidal or homicidal thoughts you must seek medical attention immediately by calling 911 or calling your MD immediately  if symptoms less severe.  You must read complete instructions/literature along with all the possible adverse reactions/side effects for all the Medicines you take and that have been prescribed to you. Take any new Medicines after you have completely understood and accpet all the possible adverse reactions/side effects.   Do not drive when taking Pain medications or sleeping medications (Benzodiazepines)  Do not take more than prescribed Pain, Sleep and Anxiety Medications. It is not advisable to combine anxiety,sleep and pain medications without talking with your primary care practitioner  Special Instructions: If you have smoked or chewed Tobacco  in the last 2 yrs please stop smoking, stop any regular Alcohol  and or any Recreational drug use.  Wear Seat belts while driving.  Do not drive if taking any narcotic, mind altering or controlled substances or recreational drugs or alcohol.

## 2023-06-18 NOTE — Discharge Summary (Signed)
Physician Discharge Summary  NAVREET FORINO GNF:[id number] DOB: 1933/11/22 DOA: 06/14/2023  PCP: [name], MD Nephrologist: Dr. Wolfgang Phoenix Cardiologist: [facility]  Admit date: 06/14/2023 Discharge date: 06/18/2023  Admitted From:  Home with HH  Disposition:  Home with [facility]   Recommendations for Outpatient Follow-up:  Follow up with PCP in 2 weeks Follow up with nephrologist in 1 week Follow up with cardiologist in 1-2 weeks Please obtain BMP in 1-2 weeks Please adjust insulin doses as needed for better glycemic control   Home Health:  PT/OT   Discharge Condition: STABLE   CODE STATUS: FULL DIET: renal/carb modified    Brief Hospitalization Summary: Please see all hospital notes, images, labs for full details of the hospitalization. ADMISSION PROVIDER HPI:  89 y.o. female with medical history significant of anxiety, GERD, DMII, HTN, anxiety, and more presents to the ED with a chief complaint of dyspnea. Patient is not able to stay awake long enough to answer even a single question. After her heparin shot, she was slightly more alert, but she was trying to guess why she came into the hospital today when I asked her to tell me about it. I think she will be better able to discuss after she has had some rest. RN at beside reports that she was alert, oriented, and chatting earlier in the night. Anyways, from PCP note - it appears that after patient was seen on 9/21 in the ER, she was not able to increase her lasix from every other day to daily as she had been advised. She was short of breath at her PCP office, reportedly not speaking in full sentences. She complained that her legs and abdomen had fluid built up again. PCP advised her to present to ED. In the ED CXR shows BL pleural effusions. Her EKG was stable compared to previous. Her blood work did show an AKI. Admission was requested for inpatient diuresis.    Hospital Course by Problem List  Hyponatremia - improving - Sodium  improved to 131 after IV furosemide diuresis - Hypervolemic hyponatremia - Continue diuresis with lasix 40 mg daily    Essential hypertension - Continue Coreg and hydralazine -- resumed home procardia XL 60 mg    Acute on chronic renal failure stage IV  - Creatinine at baseline 2.1, creatinine 2.02  - Likely cardiorenal - Continue diuresis - follow up repeat on outpatient basis with nephrologist -- pt advised to follow up with Dr. Wolfgang Phoenix in a week     Hyperlipidemia - Continue Crestor   Acute on chronic congestive heart failure with left ventricular diastolic dysfunction - Chest x-ray shows small bilateral pleural effusions, patient complains of dyspnea, patient has peripheral edema - Recently had a very similar admission and echo showed an ejection fraction of 65-70% and grade 2 diastolic dysfunction - Patient was recently seen in the ER for the same complaints and advised to take her Lasix every day instead of every other day, she was not able to make this change -Pt was given IV lasix 80 mg BID until edema resolved, then resumed home oral lasix 40 mg daily -she is feeling much better and edema has improved  -Fluid restrictions advised    Type 2 diabetes mellitus with stage 3a chronic kidney disease, with long-term current use of insulin  - resume home insulin therapy at discharge and encouraged frequent BS monitoring  Hypoglycemia from Insulin  - this has been treated   Discharge Diagnoses:  Principal Problem:   Hyponatremia Active  Problems:   Type 2 diabetes mellitus with stage 3a chronic kidney disease, with long-term current use of insulin (HCC)   Acute on chronic congestive heart failure with left ventricular diastolic dysfunction (HCC)   Hyperlipidemia   Acute on chronic renal failure [facility])   Essential hypertension   Discharge Instructions:  Allergies as of 06/18/2023       Reactions   Amlodipine Swelling   Benadryl [diphenhydramine Hcl] Hypertension    Citalopram Other (See Comments)   unknown   Farxiga [dapagliflozin] Other (See Comments)   DKA   Metformin And Related Diarrhea   Lost appetite and weight    Tramadol Other (See Comments)   Felt light headed and dizzy   Crestor [rosuvastatin] Other (See Comments)   "Feet swelling", makes her feel weak. Pt takes 5mg  at home        Medication List     TAKE these medications    calcitRIOL 0.25 MCG capsule Commonly known as: ROCALTROL Take 0.25 mcg by mouth 3 (three) times a week.   carvedilol 25 MG tablet Commonly known as: COREG Take 1 tablet (25 mg total) by mouth 2 (two) times daily with a meal.   cholecalciferol 25 MCG (1000 UNIT) tablet Commonly known as: VITAMIN D3 Take 1,000 Units by mouth daily.   furosemide 40 MG tablet Commonly known as: Lasix Take 1 tablet (40 mg total) by mouth daily. What changed: when to take this   hydrALAZINE 100 MG tablet Commonly known as: APRESOLINE Take 1 tablet (100 mg total) by mouth 3 (three) times daily.   insulin lispro 100 UNIT/ML KwikPen Commonly known as: HumaLOG KwikPen Inject 5 Units into the skin 3 (three) times daily. Max daily 30 units   NIFEdipine 60 MG 24 hr tablet Commonly known as: ADALAT CC Take 1 tablet (60 mg total) by mouth daily.   potassium chloride 10 MEQ tablet Commonly known as: KLOR-CON M Take 1 tablet (10 mEq total) by mouth every other day. Take only when taking furosemide   rosuvastatin 5 MG tablet Commonly known as: CRESTOR TAKE 1 TABLET (5 MG TOTAL) BY MOUTH DAILY.   Evaristo Bury FlexTouch 100 UNIT/ML FlexTouch Pen Generic drug: insulin degludec Inject 5 Units into the skin daily.   VITAMIN A-BETA CAROTENE PO Take 1 tablet by mouth daily.        Follow-up Information     Randa Lynn, MD. Schedule an appointment as soon as possible for a visit in 1 week(s).   Specialty: Nephrology Why: Hospital Follow Up Contact information: [address] [place]  [address] [phone]         [name], MD. Schedule an appointment as soon as possible for a visit in 2 week(s).   Specialty: Family Medicine Why: Hospital Follow Up Contact information: [address] [place] [address] [phone]         Antoine Poche, MD. Schedule an appointment as soon as possible for a visit in 2 week(s).   Specialty: Cardiology Why: Hospital Follow Up Contact information: [address] [place] [address] [phone]                Allergies  Allergen Reactions   Amlodipine Swelling   Benadryl [Diphenhydramine Hcl] Hypertension   Citalopram Other (See Comments)    unknown   Farxiga [Dapagliflozin] Other (See Comments)    DKA   Metformin And Related Diarrhea    Lost appetite and weight    Tramadol  Other (See Comments)    Felt light headed and dizzy   Crestor [Rosuvastatin] Other (See Comments)    "Feet swelling", makes her feel weak. Pt takes 5mg  at home   Allergies as of 06/18/2023       Reactions   Amlodipine Swelling   Benadryl [diphenhydramine Hcl] Hypertension   Citalopram Other (See Comments)   unknown   Farxiga [dapagliflozin] Other (See Comments)   DKA   Metformin And Related Diarrhea   Lost appetite and weight    Tramadol Other (See Comments)   Felt light headed and dizzy   Crestor [rosuvastatin] Other (See Comments)   "Feet swelling", makes her feel weak. Pt takes 5mg  at home        Medication List     TAKE these medications    calcitRIOL 0.25 MCG capsule Commonly known as: ROCALTROL Take 0.25 mcg by mouth 3 (three) times a week.   carvedilol 25 MG tablet Commonly known as: COREG Take 1 tablet (25 mg total) by mouth 2 (two) times daily with a meal.   cholecalciferol 25 MCG (1000 UNIT) tablet Commonly known as: VITAMIN D3 Take 1,000 Units by mouth daily.   furosemide 40 MG tablet Commonly known as: Lasix Take 1 tablet (40 mg total) by mouth daily. What changed:  when to take this   hydrALAZINE 100 MG tablet Commonly known as: APRESOLINE Take 1 tablet (100 mg total) by mouth 3 (three) times daily.   insulin lispro 100 UNIT/ML KwikPen Commonly known as: HumaLOG KwikPen Inject 5 Units into the skin 3 (three) times daily. Max daily 30 units   NIFEdipine 60 MG 24 hr tablet Commonly known as: ADALAT CC Take 1 tablet (60 mg total) by mouth daily.   potassium chloride 10 MEQ tablet Commonly known as: KLOR-CON M Take 1 tablet (10 mEq total) by mouth every other day. Take only when taking furosemide   rosuvastatin 5 MG tablet Commonly known as: CRESTOR TAKE 1 TABLET (5 MG TOTAL) BY MOUTH DAILY.   Evaristo Bury FlexTouch 100 UNIT/ML FlexTouch Pen Generic drug: insulin degludec Inject 5 Units into the skin daily.   VITAMIN A-BETA CAROTENE PO Take 1 tablet by mouth daily.        Procedures/Studies: DG CHEST PORT 1 VIEW  Result Date: 06/18/2023 CLINICAL DATA:  Shortness of breath.  Volume overload.  Dyspnea. EXAM: PORTABLE CHEST 1 VIEW COMPARISON:  06/14/2023 FINDINGS: Unchanged cardiomegaly. Stable mediastinal contours. Bilateral pleural effusions have improved from prior exam, particularly on the left. There is vascular congestion. No pneumothorax. IMPRESSION: Cardiomegaly with vascular congestion. Bilateral pleural effusions have improved from prior exam, particularly on the left. Electronically Signed   By: Narda Rutherford M.D.   On: 06/18/2023 10:52   DG Chest 2 View  Result Date: 06/14/2023 CLINICAL DATA:  Dyspnea EXAM: CHEST - 2 VIEW COMPARISON:  06/09/2023 FINDINGS: Small bilateral pleural effusions are present, left greater than right. No confluent pulmonary infiltrate. No pneumothorax. Cardiac size is at the upper limits of normal. Pulmonary vascularity is normal. No acute bone abnormality. IMPRESSION: 1. Small bilateral pleural effusions, left greater than right. Electronically Signed   By: Helyn Numbers M.D.   On: 06/14/2023 18:53   DG  Chest Portable 1 View  Result Date: 06/09/2023 CLINICAL DATA:  Shortness of breath EXAM: PORTABLE CHEST 1 VIEW COMPARISON:  06/01/2023 FINDINGS: Small pleural effusions greater on the left and stable. Chronic cardiomegaly and aortic atheromatous calcification. No edema or consolidation. No pneumothorax. Artifact from EKG leads. IMPRESSION: Cardiomegaly  and small pleural effusions.  No change from prior. Electronically Signed   By: Tiburcio Pea M.D.   On: 06/09/2023 04:58   DG Chest Portable 1 View  Result Date: 06/01/2023 CLINICAL DATA:  Bilateral lower extremity swelling and right upper extremity and abdominal swelling EXAM: PORTABLE CHEST 1 VIEW COMPARISON:  05/26/2023 FINDINGS: Stable cardiomegaly. Aortic atherosclerotic calcification. Pulmonary vascular congestion. Small bilateral pleural effusions. Bibasilar atelectasis. No pneumothorax. No displaced rib fractures. IMPRESSION: Stable exam since 05/26/2023. Cardiomegaly and small bilateral pleural effusions. Electronically Signed   By: Minerva Fester M.D.   On: 06/01/2023 19:56   DG CHEST PORT 1 VIEW  Result Date: 05/26/2023 CLINICAL DATA:  Shortness of breath EXAM: PORTABLE CHEST 1 VIEW COMPARISON:  Chest x-ray 05/18/2023 FINDINGS: Heart is enlarged. There are atherosclerotic calcifications of the aorta. There is no focal lung infiltrate, pleural effusion or pneumothorax. No acute fractures are seen. IMPRESSION: Cardiomegaly. No acute pulmonary process. Electronically Signed   By: Darliss Cheney M.D.   On: 05/26/2023 02:42   US RENAL  Result Date: 05/20/2023 CLINICAL DATA:  Acute renal insufficiency EXAM: RENAL / URINARY TRACT ULTRASOUND COMPLETE COMPARISON:  02/18/2023 FINDINGS: Right Kidney: Renal measurements: 7.1 x 3.2 x 4.2 cm = volume: 49.9 mL. Increased renal cortical echotexture and renal atrophy consistent with medical renal disease. No hydronephrosis. Subcentimeter simple cyst does not require imaging follow-up. Left Kidney: Renal  measurements: 10.6 x 3.7 x 5.0 cm = volume: 102.9 mL. Increased renal cortical echotexture consistent with medical renal disease. Simple left renal cysts again seen, largest in the upper pole measuring 2.8 cm. No specific imaging follow-up is required. 11 mm calcification upper pole left kidney. No hydronephrosis. Bladder: Bladder is minimally distended, limiting its evaluation. No gross abnormality. Other: Incidental small left pleural effusion. IMPRESSION: 1. Bilateral increased renal cortical echotexture consistent with medical renal disease. 2. Nonobstructing 11 mm left renal calculus.  No hydronephrosis. 3. Simple bilateral renal cortical cysts do not require imaging follow-up. Electronically Signed   By: Sharlet Salina M.D.   On: 05/20/2023 19:53   CT HEAD WO CONTRAST ( )  Result Date: 05/20/2023 CLINICAL DATA:  89 year old female with right cerebellar hemorrhage. EXAM: CT HEAD WITHOUT CONTRAST TECHNIQUE: Contiguous axial images were obtained from the base of the skull through the vertex without intravenous contrast. RADIATION DOSE REDUCTION: This exam was performed according to the departmental dose-optimization program which includes automated exposure control, adjustment of the mA and/or kV according to patient size and/or use of iterative reconstruction technique. COMPARISON:  Head CTs 05/17/2023 and earlier. FINDINGS: Brain: Hyperdense central right cerebellar hemorrhage now is 25 x 23 x 11 mm, stable since 05/17/2023. Regional edema remain stable. Partially effaced but patent 4th ventricle. And basilar cisterns remain patent. Stable gray-white differentiation elsewhere. No supratentorial midline shift or ventriculomegaly. No new intracranial hemorrhage. Vascular: Calcified atherosclerosis at the skull base. No suspicious intracranial vascular hyperdensity. Skull: Stable and intact. Sinuses/Orbits: Chronic right sphenoid sinusitis with mucoperiosteal thickening. Other Visualized paranasal sinuses and  mastoids are stable and well aerated. Other: No acute orbit or scalp soft tissue finding. IMPRESSION: 1. Right cerebellar hemorrhage unchanged since 05/17/2023. Stable edema with no significant intracranial mass effect or complicating features. 2. No new intracranial abnormality. Electronically Signed   By: Odessa Fleming M.D.   On: 05/20/2023 12:55     Subjective: Pt says she noticed much improvement in edema and no SOB symptoms today  Discharge Exam: Vitals:   06/18/23 0401 06/18/23 0908  BP: (!) 162/59 Marland Kitchen)  162/57  Pulse: 87 96  Resp: 19   Temp: 98.7 F (37.1 C) 98.2 F (36.8 C)  SpO2: 93% 93%   Vitals:   06/17/23 2036 06/18/23 0241 06/18/23 0401 06/18/23 0908  BP: (!) 153/58  (!) 162/59 (!) 162/57  Pulse: 64  87 96  Resp: 18  19   Temp: 98.4 F (36.9 C)  98.7 F (37.1 C) 98.2 F (36.8 C)  TempSrc: Oral  Oral Oral  SpO2: 96%  93% 93%  Weight:  57.9 kg    Height:       eneral exam: frail, elderly female, awake, alert, cooperative, Appears calm and comfortable  Respiratory system: Clear to auscultation. Respiratory effort normal. Cardiovascular system: normal S1 & S2 heard. No JVD, murmurs, rubs, gallops or clicks. No pedal edema. Gastrointestinal system: Abdomen is nondistended, soft and nontender. No organomegaly or masses felt. Normal bowel sounds heard. Central nervous system: Alert and oriented. No focal neurological deficits. Extremities: trace pretibial edema Bilateral LEs. Symmetric 5 x 5 power. Skin: No rashes, lesions or ulcers. Psychiatry: Judgement and insight appear normal. Mood & affect appropriate.    The results of significant diagnostics from this hospitalization (including imaging, microbiology, ancillary and laboratory) are listed below for reference.     Microbiology: No results found for this or any previous visit (from the past 240 hour(s)).   Labs: BNP (last 3 results) Recent Labs    06/01/23 1856 06/09/23 0557 06/14/23 1711  BNP 939.0* 503.0*  588.0*   Basic Metabolic Panel: Recent Labs  Lab 06/15/23 0504 06/15/23 1010 06/15/23 2209 06/16/23 0404 06/16/23 0959 06/17/23 0816 06/18/23 0450 06/18/23 0916  NA 122*   < > 126* 127* 130* 131* 131*  --   K 2.9*   < > 3.0* 2.7* 2.8* 2.8* 3.6  --   CL 85*   < > 88* 88* 90* 90* 91*  --   CO2 28   < > 28 27 29 31 29   --   GLUCOSE 159*   < > 184* 145* 247* 96 379* 479*  BUN 49*   < > 47* 47* 46* 42* 41*  --   CREATININE 2.41*   < > 2.42* 2.30* 2.38* 2.05* 2.09*  --   CALCIUM 8.0*   < > 8.2* 8.1* 8.2* 8.1* 8.2*  --   MG 2.3  --   --   --   --   --  1.9  --   PHOS  --   --   --   --   --   --  2.5  --    < > = values in this interval not displayed.   Liver Function Tests: Recent Labs  Lab 06/15/23 0504 06/18/23 0450  AST 22  --   ALT 14  --   ALKPHOS 49  --   BILITOT 0.6  --   PROT 4.3*  --   ALBUMIN 2.4* 2.7*   No results for input(s): "LIPASE", "AMYLASE" in the last 168 hours. No results for input(s): "AMMONIA" in the last 168 hours. CBC: Recent Labs  Lab 06/14/23 1711 06/15/23 0504  WBC 3.9* 4.2  NEUTROABS 2.9 2.5  HGB 11.1* 9.2*  HCT 32.7* 26.5*  MCV 91.3 91.1  PLT 225 185   Cardiac Enzymes: No results for input(s): "CKTOTAL", "CKMB", "CKMBINDEX", "TROPONINI" in the last 168 hours. BNP: Invalid input(s): "POCBNP" CBG: Recent Labs  Lab 06/18/23 0237 06/18/23 0756 06/18/23 1014 06/18/23 1017 06/18/23 1127  GLUCAP 301* 424* 463*  405* 384*   D-Dimer No results for input(s): "DDIMER" in the last 72 hours. Hgb A1c No results for input(s): "HGBA1C" in the last 72 hours. Lipid Profile No results for input(s): "CHOL", "HDL", "LDLCALC", "TRIG", "CHOLHDL", "LDLDIRECT" in the last 72 hours. Thyroid function studies No results for input(s): "TSH", "T4TOTAL", "T3FREE", "THYROIDAB" in the last 72 hours.  Invalid input(s): "FREET3" Anemia work up No results for input(s): "VITAMINB12", "FOLATE", "FERRITIN", "TIBC", "IRON", "RETICCTPCT" in the last 72  hours. Urinalysis    Component Value Date/Time   COLORURINE YELLOW 05/20/2023 1535   APPEARANCEUR CLEAR 05/20/2023 1535   LABSPEC 1.013 05/20/2023 1535   PHURINE 5.0 05/20/2023 1535   GLUCOSEU 50 (A) 05/20/2023 1535   HGBUR NEGATIVE 05/20/2023 1535   HGBUR trace-intact 04/28/2010 1259   BILIRUBINUR NEGATIVE 05/20/2023 1535   BILIRUBINUR neg 09/29/2013 1341   KETONESUR NEGATIVE 05/20/2023 1535   PROTEINUR >=300 (A) 05/20/2023 1535   UROBILINOGEN 0.2 09/29/2013 1341   UROBILINOGEN 0.2 12/28/2010 1743   NITRITE NEGATIVE 05/20/2023 1535   LEUKOCYTESUR NEGATIVE 05/20/2023 1535   Sepsis Labs Recent Labs  Lab 06/14/23 1711 06/15/23 0504  WBC 3.9* 4.2   Microbiology No results found for this or any previous visit (from the past 240 hour(s)).  Time coordinating discharge: 41 mins  SIGNED:  Standley Dakins, MD  [facility] 06/18/2023, 12:44 PM How to contact the [facility] Attending or Consulting provider 7A - 7P or covering provider during after hours 7P -7A, for this patient?  Check the care team in [facility] and look for a) attending/consulting [facility] provider listed and b) the [facility] team listed Log into [email or web address] and use [place]'s universal password to access. If you do not have the password, please contact the hospital operator. Locate the [facility] provider you are looking for under [facility] and page to a number that you can be directly reached. If you still have difficulty reaching the provider, please page the [facility] (Director on Call) for the Hospitalists listed on amion for assistance.

## 2023-06-18 NOTE — Progress Notes (Signed)
Admit: 06/14/2023 LOS: 3  3F with AoC HFpEF Exacerbation, hypervolemic hyponatremia, AoC KD4  Subjective:  Sodium improved to 131, creatinine stable at 2.09 At least 1.5 L urine output yesterday, weights appear to be down 4.4 kg She is on room air, has no significant dyspnea Has already been placed back on home dose furosemide  09/29 0701 - 09/30 0700 In: 360 [P.O.:360] Out: 1450 [Urine:1450]  Filed Weights   06/16/23 0330 06/17/23 0401 06/18/23 0241  Weight: 62.3 kg 58.4 kg 57.9 kg    Scheduled Meds:  calcitRIOL  0.25 mcg Oral Once per day on Monday Wednesday Friday   carvedilol  25 mg Oral BID WC   furosemide  40 mg Oral Daily   heparin  5,000 Units Subcutaneous Q8H   hydrALAZINE  100 mg Oral TID   insulin aspart  0-9 Units Subcutaneous TID WC   insulin aspart  2 Units Subcutaneous TID WC   NIFEdipine  60 mg Oral Daily   rosuvastatin  5 mg Oral Daily   Continuous Infusions: PRN Meds:.acetaminophen **OR** acetaminophen, hydrALAZINE, ondansetron **OR** ondansetron (ZOFRAN) IV, oxyCODONE  Current Labs: reviewed   Physical Exam:  Blood pressure (!) 162/57, pulse 96, temperature 98.2 F (36.8 C), temperature source Oral, resp. rate 19, height 5' (1.524 m), weight 57.9 kg, SpO2 93%. Elderly female, NAD, normal work of breathing Regular, 3/6 midsystolic murmur loudest at right upper sternal border Diminished in the bases otherwise clear without crackles Soft, nontender 1+ lower extremity edema, pitting, bilateral, symmetrical Nonfocal, CN II through XII grossly intact No rashes or lesions  A Hypervolemic hyponatremia secondary to #2, improving with diuresis AoC HFpEF exacerbation: Weights are down, decent response to diuretics, already transition back to oral regimen; she might need to take twice daily furosemide but most importantly needs very careful monitoring as an outpatient; she follows with [facility]   P No further suggestions, will sign off at the current time.  She  is to follow-up with her outpatient nephrology team Medication Issues; Preferred narcotic agents for pain control are hydromorphone, fentanyl, and methadone. Morphine should not be used.  Baclofen should be avoided Avoid oral sodium phosphate and magnesium citrate based laxatives / bowel preps    [name] Heck MD 06/18/2023, 10:02 AM  Recent Labs  Lab 06/16/23 0959 06/17/23 0816 06/18/23 0450 06/18/23 0916  NA 130* 131* 131*  --   K 2.8* 2.8* 3.6  --   CL 90* 90* 91*  --   CO2 29 31 29   --   GLUCOSE 247* 96 379* 479*  BUN 46* 42* 41*  --   CREATININE 2.38* 2.05* 2.09*  --   CALCIUM 8.2* 8.1* 8.2*  --   PHOS  --   --  2.5  --    Recent Labs  Lab 06/14/23 1711 06/15/23 0504  WBC 3.9* 4.2  NEUTROABS 2.9 2.5  HGB 11.1* 9.2*  HCT 32.7* 26.5*  MCV 91.3 91.1  PLT 225 185

## 2023-06-18 NOTE — Progress Notes (Signed)
   06/18/23 0859  Provider Notification  Provider Name/Title Dr Laural Benes  Date Provider Notified 06/18/23  Time Provider Notified [phone]  Method of Notification Page  Notification Reason Critical Result (CBG 424)  Test performed and critical result CBG  Date Critical Result Received 06/18/23  Time Critical Result Received 0840  Provider response See new orders  Date of Provider Response 06/18/23  Time of Provider Response [phone]

## 2023-06-18 NOTE — Progress Notes (Signed)
Mobility Specialist Progress Note:    06/18/23 0930  Mobility  Activity Ambulated with assistance in room;Transferred to/from [facility];Transferred from bed to chair  Level of Assistance Contact guard assist, steadying assist  Assistive Device None  Distance Ambulated (ft) 25 ft  Range of Motion/Exercises Active;All extremities  Activity Response Tolerated well  Mobility Referral Yes  $Mobility charge 1 Mobility  Mobility Specialist Start Time (ACUTE ONLY) 0930  Mobility Specialist Stop Time (ACUTE ONLY) 0940  Mobility Specialist Time Calculation (min) (ACUTE ONLY) 10 min   Pt received requesting assistance to [facility], agreeable to further mobility. Required CGA and hand held assistance with no AD to stand and ambulate. Tolerated well, pt was unsteady during ambulation. Left pt in chair, alarm on. Call bell in reach, all needs met.   Lawerance Bach Mobility Specialist Please contact via Special educational needs teacher or  Rehab office at [phone]

## 2023-06-18 NOTE — Progress Notes (Signed)
06/18/2023 12:22 PM  Attempted to call husband, only contact listed, no answer x 2.   Maryln Manuel MD

## 2023-06-18 NOTE — Care Management Important Message (Signed)
Important Message  Patient Details  Name: Emily Jennings MRN: [id number] Date of Birth: 16-Mar-1934   Important Message Given:  Yes - Medicare IM     Corey Harold 06/18/2023, 12:31 PM

## 2023-06-18 NOTE — Progress Notes (Signed)
Patient discharged home with instructions given on medications and  follow up visits, patient and family verbalized understanding. Prescriptions sent to Pharmacy of choice documented on AVS. IV discontinued,catheter. Accompanied by staff to an awaiting vehicle.

## 2023-06-18 NOTE — Progress Notes (Signed)
Nurse at bedside, CBG was 405,Dr Laural Benes ordered additional Novolog 10 units SQ to be given,orders received and given. Plan of care on going.

## 2023-06-18 NOTE — Consult Note (Signed)
[facility] Inpatient Consult   06/18/2023  Emily Jennings [birth year] [id number]  Primary Care Provider:  Dr. Lodema Jennings ([facility])  Patient is currently active with Care Management for chronic disease management services.  Patient has been engaged by a Charity fundraiser.  Our community based plan of care has focused on disease management and community resource support.   Patient will receive a post hospital call and will be evaluated for assessments and disease process education.   Plan: TOC indicates HHPT/OT.  Inpatient Transition Of Care [TOC] team member to make aware that Care Management following.  Of note, Care Management services does not replace or interfere with any services that are needed or arranged by inpatient [facility] care management team.   For additional questions or referrals please contact:   Emily Cousin, RN, [facility] Liaison [place]   Population Health Office Hours MTWF  8:00 am-6:00 pm [phone] mobile [phone] [Office toll free line] Office Hours are M-F 8:30 - 5 pm [email or web address]

## 2023-06-19 ENCOUNTER — Telehealth: Payer: Self-pay | Admitting: *Deleted

## 2023-06-19 NOTE — Progress Notes (Signed)
Care Coordination Note  06/19/2023 Name: PAMELIA MCGURL MRN: [id number] DOB: [birth year]  Len Blalock Fluet is a 89 y.o. year old female who is a primary care patient of [name], MD and is actively engaged with the care management team. I reached out to [name] by phone today to assist with scheduling a follow up visit with the RN Case Manager  Follow up plan: Unsuccessful telephone outreach attempt made. A HIPAA compliant phone message was left for the patient providing contact information and requesting a return call.   [facility]  Care Coordination Care Guide  Direct Dial: [phone]

## 2023-06-20 DIAGNOSIS — R808 Other proteinuria: Secondary | ICD-10-CM | POA: Diagnosis not present

## 2023-06-20 DIAGNOSIS — N184 Chronic kidney disease, stage 4 (severe): Secondary | ICD-10-CM | POA: Diagnosis not present

## 2023-06-20 DIAGNOSIS — N279 Small kidney, unspecified: Secondary | ICD-10-CM | POA: Diagnosis not present

## 2023-06-20 DIAGNOSIS — E1122 Type 2 diabetes mellitus with diabetic chronic kidney disease: Secondary | ICD-10-CM | POA: Diagnosis not present

## 2023-06-24 ENCOUNTER — Other Ambulatory Visit: Payer: Self-pay | Admitting: Family Medicine

## 2023-06-26 ENCOUNTER — Ambulatory Visit: Payer: Medicare Other | Admitting: Family Medicine

## 2023-06-27 ENCOUNTER — Other Ambulatory Visit: Payer: Self-pay | Admitting: Family Medicine

## 2023-06-28 NOTE — Progress Notes (Signed)
Care Coordination Note  06/28/2023 Name: Emily Jennings MRN: [id number] DOB: [birth year]  Emily Jennings is a 89 y.o. year old female who is a primary care patient of [name], MD and is actively engaged with the care management team. I reached out to [name] by phone today to assist with re-scheduling a follow up visit with the RN Case Manager  Follow up plan: Unsuccessful telephone outreach attempt made. A HIPAA compliant phone message was left for the patient providing contact information and requesting a return call.  We have been unable to make contact with the patient for follow up. The care management team is available to follow up with the patient after provider conversation with the patient regarding recommendation for care management engagement and subsequent re-referral to the care management team.   [facility] Coordination Care Guide  Direct Dial: [phone]

## 2023-06-28 NOTE — Progress Notes (Signed)
Care Coordination Note  06/28/2023 Name: RIETTA ELSNER MRN: [id number] DOB: [birth year]  Len Blalock Caird is a 89 y.o. year old female who is a primary care patient of [name], MD and is actively engaged with the care management team. I reached out to [name] by phone today to assist with re-scheduling a follow up visit with the RN Case Manager  Follow up plan: Telephone appointment with care management team member scheduled for:07/12/23  [facility] Coordination Care Guide  Direct Dial: [phone]

## 2023-07-02 DIAGNOSIS — I129 Hypertensive chronic kidney disease with stage 1 through stage 4 chronic kidney disease, or unspecified chronic kidney disease: Secondary | ICD-10-CM | POA: Diagnosis not present

## 2023-07-02 DIAGNOSIS — E211 Secondary hyperparathyroidism, not elsewhere classified: Secondary | ICD-10-CM | POA: Diagnosis not present

## 2023-07-02 DIAGNOSIS — R809 Proteinuria, unspecified: Secondary | ICD-10-CM | POA: Diagnosis not present

## 2023-07-02 DIAGNOSIS — D631 Anemia in chronic kidney disease: Secondary | ICD-10-CM | POA: Diagnosis not present

## 2023-07-02 DIAGNOSIS — N189 Chronic kidney disease, unspecified: Secondary | ICD-10-CM | POA: Diagnosis not present

## 2023-07-03 ENCOUNTER — Encounter (HOSPITAL_COMMUNITY): Payer: Self-pay

## 2023-07-03 ENCOUNTER — Emergency Department (HOSPITAL_COMMUNITY): Payer: Medicare Other

## 2023-07-03 ENCOUNTER — Inpatient Hospital Stay (HOSPITAL_COMMUNITY)
Admission: EM | Admit: 2023-07-03 | Discharge: 2023-07-06 | DRG: 177 | Disposition: A | Discharge status: Home Health | Payer: Medicare Other | Attending: Family Medicine | Admitting: Family Medicine

## 2023-07-03 ENCOUNTER — Other Ambulatory Visit: Payer: Self-pay

## 2023-07-03 DIAGNOSIS — Z888 Allergy status to other drugs, medicaments and biological substances status: Secondary | ICD-10-CM

## 2023-07-03 DIAGNOSIS — N184 Chronic kidney disease, stage 4 (severe): Secondary | ICD-10-CM | POA: Diagnosis not present

## 2023-07-03 DIAGNOSIS — Z823 Family history of stroke: Secondary | ICD-10-CM

## 2023-07-03 DIAGNOSIS — Z794 Long term (current) use of insulin: Secondary | ICD-10-CM

## 2023-07-03 DIAGNOSIS — E11649 Type 2 diabetes mellitus with hypoglycemia without coma: Secondary | ICD-10-CM | POA: Diagnosis not present

## 2023-07-03 DIAGNOSIS — Z885 Allergy status to narcotic agent status: Secondary | ICD-10-CM

## 2023-07-03 DIAGNOSIS — I509 Heart failure, unspecified: Secondary | ICD-10-CM

## 2023-07-03 DIAGNOSIS — Y95 Nosocomial condition: Secondary | ICD-10-CM | POA: Diagnosis present

## 2023-07-03 DIAGNOSIS — K219 Gastro-esophageal reflux disease without esophagitis: Secondary | ICD-10-CM | POA: Diagnosis not present

## 2023-07-03 DIAGNOSIS — E782 Mixed hyperlipidemia: Secondary | ICD-10-CM | POA: Diagnosis present

## 2023-07-03 DIAGNOSIS — Z7689 Persons encountering health services in other specified circumstances: Secondary | ICD-10-CM

## 2023-07-03 DIAGNOSIS — Z83438 Family history of other disorder of lipoprotein metabolism and other lipidemia: Secondary | ICD-10-CM

## 2023-07-03 DIAGNOSIS — E1122 Type 2 diabetes mellitus with diabetic chronic kidney disease: Secondary | ICD-10-CM | POA: Diagnosis present

## 2023-07-03 DIAGNOSIS — J9601 Acute respiratory failure with hypoxia: Secondary | ICD-10-CM | POA: Diagnosis present

## 2023-07-03 DIAGNOSIS — E8809 Other disorders of plasma-protein metabolism, not elsewhere classified: Secondary | ICD-10-CM | POA: Diagnosis not present

## 2023-07-03 DIAGNOSIS — N189 Chronic kidney disease, unspecified: Secondary | ICD-10-CM | POA: Diagnosis present

## 2023-07-03 DIAGNOSIS — I5033 Acute on chronic diastolic (congestive) heart failure: Secondary | ICD-10-CM | POA: Diagnosis present

## 2023-07-03 DIAGNOSIS — Z8261 Family history of arthritis: Secondary | ICD-10-CM

## 2023-07-03 DIAGNOSIS — Z825 Family history of asthma and other chronic lower respiratory diseases: Secondary | ICD-10-CM

## 2023-07-03 DIAGNOSIS — E041 Nontoxic single thyroid nodule: Secondary | ICD-10-CM | POA: Diagnosis not present

## 2023-07-03 DIAGNOSIS — D631 Anemia in chronic kidney disease: Secondary | ICD-10-CM | POA: Diagnosis present

## 2023-07-03 DIAGNOSIS — Z833 Family history of diabetes mellitus: Secondary | ICD-10-CM

## 2023-07-03 DIAGNOSIS — R918 Other nonspecific abnormal finding of lung field: Secondary | ICD-10-CM | POA: Diagnosis not present

## 2023-07-03 DIAGNOSIS — I130 Hypertensive heart and chronic kidney disease with heart failure and stage 1 through stage 4 chronic kidney disease, or unspecified chronic kidney disease: Secondary | ICD-10-CM | POA: Diagnosis not present

## 2023-07-03 DIAGNOSIS — E871 Hypo-osmolality and hyponatremia: Secondary | ICD-10-CM | POA: Diagnosis not present

## 2023-07-03 DIAGNOSIS — J690 Pneumonitis due to inhalation of food and vomit: Secondary | ICD-10-CM | POA: Diagnosis not present

## 2023-07-03 DIAGNOSIS — E1165 Type 2 diabetes mellitus with hyperglycemia: Secondary | ICD-10-CM | POA: Diagnosis not present

## 2023-07-03 DIAGNOSIS — Z1152 Encounter for screening for COVID-19: Secondary | ICD-10-CM | POA: Diagnosis not present

## 2023-07-03 DIAGNOSIS — J189 Pneumonia, unspecified organism: Secondary | ICD-10-CM | POA: Diagnosis not present

## 2023-07-03 DIAGNOSIS — N179 Acute kidney failure, unspecified: Secondary | ICD-10-CM | POA: Diagnosis not present

## 2023-07-03 DIAGNOSIS — R1310 Dysphagia, unspecified: Secondary | ICD-10-CM

## 2023-07-03 DIAGNOSIS — Z800 Family history of malignant neoplasm of digestive organs: Secondary | ICD-10-CM

## 2023-07-03 DIAGNOSIS — Z86000 Personal history of in-situ neoplasm of breast: Secondary | ICD-10-CM | POA: Diagnosis not present

## 2023-07-03 DIAGNOSIS — Z8249 Family history of ischemic heart disease and other diseases of the circulatory system: Secondary | ICD-10-CM

## 2023-07-03 DIAGNOSIS — J90 Pleural effusion, not elsewhere classified: Secondary | ICD-10-CM | POA: Diagnosis not present

## 2023-07-03 DIAGNOSIS — E785 Hyperlipidemia, unspecified: Secondary | ICD-10-CM | POA: Diagnosis present

## 2023-07-03 DIAGNOSIS — Z853 Personal history of malignant neoplasm of breast: Secondary | ICD-10-CM | POA: Diagnosis not present

## 2023-07-03 DIAGNOSIS — I2510 Atherosclerotic heart disease of native coronary artery without angina pectoris: Secondary | ICD-10-CM | POA: Diagnosis not present

## 2023-07-03 DIAGNOSIS — M810 Age-related osteoporosis without current pathological fracture: Secondary | ICD-10-CM | POA: Diagnosis present

## 2023-07-03 DIAGNOSIS — R0602 Shortness of breath: Principal | ICD-10-CM

## 2023-07-03 DIAGNOSIS — Z79899 Other long term (current) drug therapy: Secondary | ICD-10-CM

## 2023-07-03 DIAGNOSIS — Z7984 Long term (current) use of oral hypoglycemic drugs: Secondary | ICD-10-CM

## 2023-07-03 LAB — COMPREHENSIVE METABOLIC PANEL
ALT: 13 U/L (ref 0–44)
AST: 20 U/L (ref 15–41)
Albumin: 3.3 g/dL — ABNORMAL LOW (ref 3.5–5.0)
Alkaline Phosphatase: 61 U/L (ref 38–126)
Anion gap: 12 (ref 5–15)
BUN: 51 mg/dL — ABNORMAL HIGH (ref 8–23)
CO2: 27 mmol/L (ref 22–32)
Calcium: 9 mg/dL (ref 8.9–10.3)
Chloride: 92 mmol/L — ABNORMAL LOW (ref 98–111)
Creatinine, Ser: 2.95 mg/dL — ABNORMAL HIGH (ref 0.44–1.00)
GFR, Estimated: 15 mL/min — ABNORMAL LOW (ref >60)
Glucose, Bld: 64 mg/dL — ABNORMAL LOW (ref 70–99)
Potassium: 4.3 mmol/L (ref 3.5–5.1)
Sodium: 131 mmol/L — ABNORMAL LOW (ref 135–145)
Total Bilirubin: 0.7 mg/dL (ref 0.3–1.2)
Total Protein: 6.4 g/dL — ABNORMAL LOW (ref 6.5–8.1)

## 2023-07-03 LAB — CBC WITH DIFFERENTIAL/PLATELET
Abs Immature Granulocytes: 0.04 10*3/uL (ref 0.00–0.07)
Basophils Absolute: 0 10*3/uL (ref 0.0–0.1)
Basophils Relative: 0 %
Eosinophils Absolute: 0.1 10*3/uL (ref 0.0–0.5)
Eosinophils Relative: 2 %
HCT: 33.9 % — ABNORMAL LOW (ref 36.0–46.0)
Hemoglobin: 10.9 g/dL — ABNORMAL LOW (ref 12.0–15.0)
Immature Granulocytes: 1 %
Lymphocytes Relative: 11 %
Lymphs Abs: 0.7 10*3/uL (ref 0.7–4.0)
MCH: 30.5 pg (ref 26.0–34.0)
MCHC: 32.2 g/dL (ref 30.0–36.0)
MCV: 95 fL (ref 80.0–100.0)
Monocytes Absolute: 0.7 10*3/uL (ref 0.1–1.0)
Monocytes Relative: 12 %
Neutro Abs: 4.4 10*3/uL (ref 1.7–7.7)
Neutrophils Relative %: 74 %
Platelets: 274 10*3/uL (ref 150–400)
RBC: 3.57 MIL/uL — ABNORMAL LOW (ref 3.87–5.11)
RDW: 13 % (ref 11.5–15.5)
WBC: 5.9 10*3/uL (ref 4.0–10.5)
nRBC: 0 % (ref 0.0–0.2)

## 2023-07-03 LAB — CBG MONITORING, ED
Glucose-Capillary: 191 mg/dL — ABNORMAL HIGH (ref 70–99)
Glucose-Capillary: 189 mg/dL — ABNORMAL HIGH (ref 70–99)
Glucose-Capillary: 216 mg/dL — ABNORMAL HIGH (ref 70–99)

## 2023-07-03 LAB — PROCALCITONIN: Procalcitonin: 0.22 ng/mL

## 2023-07-03 LAB — RESP PANEL BY RT-PCR (RSV, FLU A&B, COVID)  RVPGX2
Influenza A by PCR: NEGATIVE
Influenza B by PCR: NEGATIVE
Resp Syncytial Virus by PCR: NEGATIVE
SARS Coronavirus 2 by RT PCR: NEGATIVE

## 2023-07-03 LAB — TROPONIN I (HIGH SENSITIVITY)
Troponin I (High Sensitivity): 11 ng/L (ref <18)
Troponin I (High Sensitivity): 9 ng/L (ref <18)

## 2023-07-03 LAB — BRAIN NATRIURETIC PEPTIDE: B Natriuretic Peptide: 630 pg/mL — ABNORMAL HIGH (ref 0.0–100.0)

## 2023-07-03 MED ORDER — FUROSEMIDE 10 MG/ML IJ SOLN
40.0000 mg | Freq: Once | INTRAMUSCULAR | Status: AC
  Administered 2023-07-03: 40 mg via INTRAVENOUS
  Filled 2023-07-03: qty 4

## 2023-07-03 MED ORDER — ALBUTEROL SULFATE HFA 108 (90 BASE) MCG/ACT IN AERS: 2.0000 | INHALATION_SPRAY | RESPIRATORY_TRACT | Status: DC | PRN

## 2023-07-03 MED ORDER — VANCOMYCIN HCL IN DEXTROSE 1-5 GM/200ML-% IV SOLN
1000.0000 mg | Freq: Once | INTRAVENOUS | Status: AC
  Administered 2023-07-04: 1000 mg via INTRAVENOUS
  Filled 2023-07-03: qty 200

## 2023-07-03 MED ORDER — SODIUM CHLORIDE 0.9 % IV SOLN
1.0000 g | Freq: Once | INTRAVENOUS | Status: AC
  Administered 2023-07-03: 1 g via INTRAVENOUS
  Filled 2023-07-03 (×2): qty 10

## 2023-07-03 NOTE — H&P (Signed)
History and Physical    Patient: Emily Jennings:[id number] DOB: [birth year] DOA: 07/03/2023 DOS: the patient was seen and examined on 07/03/2023 PCP: [name], MD  Patient coming from: {Point_of_Origin:[address]}  Chief Complaint:  Chief Complaint  Patient presents with   Shortness of Breath   HPI: Emily Jennings is a 89 y.o. female with medical history significant of ***  Review of Systems: {ROS_Text:26778} Past Medical History:  Diagnosis Date   Allergy    Anxiety    ANXIETY DISORDER, GENERALIZED 07/15/2007   Qualifier: Diagnosis of  By: Chipper Herb     Arthritis    Cancer [facility]) 2009   breast, carcinoma in situ left   Carcinoma in situ of breast 05/21/2008   Qualifier: Diagnosis of  By: [name] MD, Margaret  Diagnosed in 2009, completed 5 year course of tamoxifen, no evidence of recurrence    Carotid stenosis    11/16/2005  mild plaque formation and stenosis proximal right ECA   Cataract    Complication of anesthesia    Coronary artery disease    cardiac catheterization on 03/20/2006  LAD mid 40% stenosis, left circumflex mild 40% stenosis, RCA mid-vessel 40% to 50% lesion   EF 60%   Diabetes mellitus    GERD (gastroesophageal reflux disease)    Hernia, inguinal    left   Hyperglycemia    Hypertension    Insomnia 11/16/2011   Low blood potassium    Non-insulin dependent type 2 diabetes mellitus (HCC)    Osteoporosis    Shortness of breath    2D Echocardiogram 01/26/2009   EF of greater than 55%, mild MR, mild TR, normal ventricular function   Thickened endometrium 10/26/2017   Noted by gyne in 2017, missed 6 month follow up, referred in 09/2017   Ventricular tachycardia, non-sustained (HCC)    developed during stress test 02/08/2006, spontaneously aborted, mild reversible apical defect   Past Surgical History:  Procedure Laterality Date   BREAST LUMPECTOMY Left 2009   Left breast 2009   CATARACT EXTRACTION W/PHACO Left 10/28/2014   Procedure: PHACO  EMULSION CATARACT EXTRACTION WITH INTRAOCULAR LENS IMPLANT LEFT EYE (IOC);  Surgeon: Chalmers Guest, MD;  Location: [facility] OR;  Service: Ophthalmology;  Laterality: Left;   COLONOSCOPY     cyst removed from left foot     REFRACTIVE SURGERY Left    Social History:  reports that she has never smoked. She has never been exposed to tobacco smoke. She has never used smokeless tobacco. She reports that she does not drink alcohol and does not use drugs.  Allergies  Allergen Reactions   Amlodipine Swelling   Benadryl [Diphenhydramine Hcl] Hypertension   Citalopram Other (See Comments)    unknown   Farxiga [Dapagliflozin] Other (See Comments)    DKA   Metformin And Related Diarrhea    Lost appetite and weight    Tramadol Other (See Comments)    Felt light headed and dizzy   Crestor [Rosuvastatin] Other (See Comments)    "Feet swelling", makes her feel weak. Pt takes 5mg  at home    Family History  Problem Relation Age of Onset   Hypertension Mother    Hyperlipidemia Mother    Stroke Mother    Urticaria Mother    Cancer Father        pancreatic   Colon cancer Father    Heart disease Brother 40       bypass   Heart disease Brother 59  bypass   Arthritis Other    Asthma Other    Diabetes Other    Colon cancer Paternal Aunt    Esophageal cancer Neg Hx    Stomach cancer Neg Hx    Rectal cancer Neg Hx     Prior to Admission medications   Medication Sig Start Date End Date Taking? Authorizing Provider  calcitRIOL (ROCALTROL) 0.25 MCG capsule Take 0.25 mcg by mouth 3 (three) times a week.    [provider]  carvedilol (COREG) 25 MG tablet Take 1 tablet (25 mg total) by mouth 2 (two) times daily with a meal. 05/28/23   Danford, Earl Lites, MD  cholecalciferol (VITAMIN D3) 25 MCG (1000 UNIT) tablet Take 1,000 Units by mouth daily.    [provider]  furosemide (LASIX) 40 MG tablet Take 1 tablet (40 mg total) by mouth daily. 06/18/23 06/17/24  Johnson, Clanford L, MD   hydrALAZINE (APRESOLINE) 100 MG tablet Take 1 tablet (100 mg total) by mouth 3 (three) times daily. 05/28/23   Danford, Earl Lites, MD  insulin degludec (TRESIBA FLEXTOUCH) 100 UNIT/ML FlexTouch Pen Inject 5 Units into the skin daily. 05/01/23   Shamleffer, Konrad Dolores, MD  insulin lispro (HUMALOG KWIKPEN) 100 UNIT/ML KwikPen Inject 5 Units into the skin 3 (three) times daily. Max daily 30 units 05/01/23   Shamleffer, Konrad Dolores, MD  NIFEdipine (ADALAT CC) 60 MG 24 hr tablet Take 1 tablet (60 mg total) by mouth daily. 05/29/23   Danford, Earl Lites, MD  potassium chloride (KLOR-CON M) 10 MEQ tablet Take 1 tablet (10 mEq total) by mouth every other day. Take only when taking furosemide 06/05/23   Cleora Fleet, MD  Potassium Chloride ER 20 MEQ TBCR TAKE 1 TABLET DAILY BY MOUTH TAKE WHILE TAKING LASIX/FUROSEMIDE 06/27/23   [name], MD  rosuvastatin (CRESTOR) 5 MG tablet TAKE 1 TABLET (5 MG TOTAL) BY MOUTH DAILY. Patient taking differently: Take 5 mg by mouth daily. 01/15/23   [name], MD  VITAMIN A-BETA CAROTENE PO Take 1 tablet by mouth daily.    [provider]    Physical Exam: Vitals:   07/03/23 1522 07/03/23 1525 07/03/23 1945  BP:  (!) 106/56 (!) 116/55  Pulse:  69 67  Resp:  18 (!) 24  Temp:  97.7 F (36.5 C) 97.7 F (36.5 C)  TempSrc:  Oral Oral  SpO2:  97% 96%  Weight: 54.4 kg    Height: 5' (1.524 m)     *** Data Reviewed: {Tip this will not be part of the note when signed- Document your independent interpretation of telemetry tracing, EKG, lab, Radiology test or any other diagnostic tests. Add any new diagnostic test ordered today. (Optional):26781} {Results:26384}  Assessment and Plan: No notes have been filed under this hospital service. Service: Hospitalist     Advance Care Planning:   Code Status: Full Code ***  Consults: ***  Family Communication: ***  Severity of  Illness: {Observation/Inpatient:21159}  Author: Lilyan Gilford, DO 07/03/2023 10:59 PM  For on call review [email or web address].

## 2023-07-03 NOTE — Progress Notes (Signed)
ED Pharmacy Antibiotic Sign Off An antibiotic consult was received from an ED provider for vancomycin per pharmacy dosing for PNA. A chart review was completed to assess appropriateness.   The following one time order(s) were placed:  Vancomycin 1g IV x1  Further antibiotic and/or antibiotic pharmacy consults should be ordered by the admitting provider if indicated.   Thank you for allowing pharmacy to be a part of this patient's care.   Vernard Gambles, PharmD, BCPS   Clinical Pharmacist 07/03/23 10:47 PM

## 2023-07-03 NOTE — ED Triage Notes (Signed)
Pt complains of SOB x1 week  97% RA 18RR   Appears in NAD

## 2023-07-03 NOTE — Progress Notes (Signed)
Pharmacy Antibiotic Note  Emily Jennings is a 89 y.o. female admitted on 07/03/2023 with pneumonia.  Off note ESRD with noCefepime and vancomycin x1 in ED. Pharmacy has been consulted for vancomycin and cefepime dosing.  Plan: Cefepime 1g q24h Vancomycin per levels  F/u renal function, need for HD and length of therapy F/u MRSA nares and vancomycin level as needed   Height: 5' (152.4 cm) Weight: 54.4 kg (120 lb) IBW/kg (Calculated) : 45.5  Temp (24hrs), Avg:97.7 F (36.5 C), Min:97.7 F (36.5 C), Max:97.7 F (36.5 C)  Recent Labs  Lab 07/03/23 1557  WBC 5.9  CREATININE 2.95*    Estimated Creatinine Clearance: 9.7 mL/min (A) (by C-G formula based on SCr of 2.95 mg/dL (H)).    Allergies  Allergen Reactions  . Amlodipine Swelling  . Benadryl [Diphenhydramine Hcl] Hypertension  . Citalopram Other (See Comments)    unknown  . Marcelline Deist [Dapagliflozin] Other (See Comments)    DKA  . Metformin And Related Diarrhea    Lost appetite and weight   . Tramadol Other (See Comments)    Felt light headed and dizzy  . Crestor [Rosuvastatin] Other (See Comments)    "Feet swelling", makes her feel weak. Pt takes 5mg  at home    Antimicrobials this admission: Cefepime 10/15 > Vancomycin 10/15 >  Microbiology results: 10/15 resp panel: neg 10/15 MRSA nares: pending  Thank you for allowing pharmacy to be a part of this patient's care.  Marja Kays 07/03/2023 11:05 PM

## 2023-07-03 NOTE — ED Provider Notes (Signed)
[place] EMERGENCY DEPARTMENT AT [facility] Provider Note   CSN: [id number] Arrival date & time: 07/03/23  1515     History {Add pertinent medical, surgical, social history, OB history to HPI:1} Chief Complaint  Patient presents with   Shortness of Breath    Emily Jennings is a 89 y.o. female.   Shortness of Breath      Home Medications Prior to Admission medications   Medication Sig Start Date End Date Taking? Authorizing Provider  calcitRIOL (ROCALTROL) 0.25 MCG capsule Take 0.25 mcg by mouth 3 (three) times a week.    [provider]  carvedilol (COREG) 25 MG tablet Take 1 tablet (25 mg total) by mouth 2 (two) times daily with a meal. 05/28/23   Danford, Earl Lites, MD  cholecalciferol (VITAMIN D3) 25 MCG (1000 UNIT) tablet Take 1,000 Units by mouth daily.    [provider]  furosemide (LASIX) 40 MG tablet Take 1 tablet (40 mg total) by mouth daily. 06/18/23 06/17/24  Johnson, Clanford L, MD  hydrALAZINE (APRESOLINE) 100 MG tablet Take 1 tablet (100 mg total) by mouth 3 (three) times daily. 05/28/23   Danford, Earl Lites, MD  insulin degludec (TRESIBA FLEXTOUCH) 100 UNIT/ML FlexTouch Pen Inject 5 Units into the skin daily. 05/01/23   Shamleffer, Konrad Dolores, MD  insulin lispro (HUMALOG KWIKPEN) 100 UNIT/ML KwikPen Inject 5 Units into the skin 3 (three) times daily. Max daily 30 units 05/01/23   Shamleffer, Konrad Dolores, MD  NIFEdipine (ADALAT CC) 60 MG 24 hr tablet Take 1 tablet (60 mg total) by mouth daily. 05/29/23   Danford, Earl Lites, MD  potassium chloride (KLOR-CON M) 10 MEQ tablet Take 1 tablet (10 mEq total) by mouth every other day. Take only when taking furosemide 06/05/23   Cleora Fleet, MD  Potassium Chloride ER 20 MEQ TBCR TAKE 1 TABLET DAILY BY MOUTH TAKE WHILE TAKING LASIX/FUROSEMIDE 06/27/23   [name], MD  rosuvastatin (CRESTOR) 5 MG tablet TAKE 1 TABLET (5 MG TOTAL) BY MOUTH DAILY. Patient taking  differently: Take 5 mg by mouth daily. 01/15/23   [name], MD  VITAMIN A-BETA CAROTENE PO Take 1 tablet by mouth daily.    [provider]      Allergies    Amlodipine, Benadryl [diphenhydramine hcl], Citalopram, Farxiga [dapagliflozin], Metformin and related, Tramadol, and Crestor [rosuvastatin]    Review of Systems   Review of Systems  Respiratory:  Positive for shortness of breath.     Physical Exam Updated Vital Signs BP (!) 116/55 (BP Location: Left Arm)   Pulse 67   Temp 97.7 F (36.5 C) (Oral)   Resp (!) 24   Ht 5' (1.524 m)   Wt 54.4 kg   SpO2 96%   BMI 23.44 kg/m  Physical Exam  ED Results / Procedures / Treatments   Labs (all labs ordered are listed, but only abnormal results are displayed) Labs Reviewed  CBC WITH DIFFERENTIAL/PLATELET - Abnormal; Notable for the following components:      Result Value   RBC 3.57 (*)    Hemoglobin 10.9 (*)    HCT 33.9 (*)    All other components within normal limits  COMPREHENSIVE METABOLIC PANEL - Abnormal; Notable for the following components:   Sodium 131 (*)    Chloride 92 (*)    Glucose, Bld 64 (*)    BUN 51 (*)    Creatinine, Ser 2.95 (*)    Total Protein 6.4 (*)  Albumin 3.3 (*)    GFR, Estimated 15 (*)    All other components within normal limits  BRAIN NATRIURETIC PEPTIDE - Abnormal; Notable for the following components:   B Natriuretic Peptide 630.0 (*)    All other components within normal limits  CBG MONITORING, ED - Abnormal; Notable for the following components:   Glucose-Capillary 191 (*)    All other components within normal limits  RESP PANEL BY RT-PCR (RSV, FLU A&B, COVID)  RVPGX2  PROCALCITONIN  TROPONIN I (HIGH SENSITIVITY)  TROPONIN I (HIGH SENSITIVITY)    EKG None  Radiology DG Chest 2 View  Result Date: 07/03/2023 CLINICAL DATA:  Shortness of breath.  History of left breast cancer. EXAM: CHEST - 2 VIEW COMPARISON:  06/18/2023 FINDINGS: Heart size is normal. Aortic  atherosclerosis as seen previously. Small pleural effusions in both posterior costophrenic angles. Worsening infiltrate in the left lower lobe consistent with bronchopneumonia. No sign of heart failure. No focal bone lesion. IMPRESSION: Worsening left lower lobe infiltrate consistent with bronchopneumonia. Small pleural effusions in both posterior costophrenic angles. Electronically Signed   By: Paulina Fusi M.D.   On: 07/03/2023 18:11    Procedures Procedures  {Document cardiac monitor, telemetry assessment procedure when appropriate:1}  Medications Ordered in ED Medications  albuterol (VENTOLIN HFA) 108 (90 Base) MCG/ACT inhaler 2 puff (has no administration in time range)  furosemide (LASIX) injection 40 mg (has no administration in time range)  ceFEPIme (MAXIPIME) 1 g in sodium chloride 0.9 % 100 mL IVPB (has no administration in time range)    ED Course/ Medical Decision Making/ A&P   {   Click here for ABCD2, HEART and other calculatorsREFRESH Note before signing :1}                              Medical Decision Making Amount and/or Complexity of Data Reviewed Labs: ordered. Radiology: ordered.  Risk Prescription drug management. Decision regarding hospitalization.   ***  {Document critical care time when appropriate:1} {Document review of labs and clinical decision tools ie heart score, Chads2Vasc2 etc:1}  {Document your independent review of radiology images, and any outside records:1} {Document your discussion with family members, caretakers, and with consultants:1} {Document social determinants of health affecting pt's care:1} {Document your decision making why or why not admission, treatments were needed:1} Final Clinical Impression(s) / ED Diagnoses Final diagnoses:  None    Rx / DC Orders ED Discharge Orders     None

## 2023-07-03 NOTE — ED Notes (Signed)
Reassessed vitals  Noted BGL on labs was 64 FSBG 191

## 2023-07-03 NOTE — Progress Notes (Incomplete)
Pharmacy Antibiotic Note  Emily Jennings is a 89 y.o. female admitted on 07/03/2023 with pneumonia.  Off note ESRD with noCefepime and vancomycin x1 in ED. Pharmacy has been consulted for vancomycin and cefepime dosing.  Plan: Cefepime 1g q24h Vancomycin per levels  F/u renal function, need for HD and length of therapy F/u MRSA nares and vancomycin level as needed   Height: 5' (152.4 cm) Weight: 54.4 kg (120 lb) IBW/kg (Calculated) : 45.5  Temp (24hrs), Avg:97.7 F (36.5 C), Min:97.7 F (36.5 C), Max:97.7 F (36.5 C)  Recent Labs  Lab 07/03/23 1557  WBC 5.9  CREATININE 2.95*    Estimated Creatinine Clearance: 9.7 mL/min (A) (by C-G formula based on SCr of 2.95 mg/dL (H)).    Allergies  Allergen Reactions  . Amlodipine Swelling  . Benadryl [Diphenhydramine Hcl] Hypertension  . Citalopram Other (See Comments)    unknown  . Marcelline Deist [Dapagliflozin] Other (See Comments)    DKA  . Metformin And Related Diarrhea    Lost appetite and weight   . Tramadol Other (See Comments)    Felt light headed and dizzy  . Crestor [Rosuvastatin] Other (See Comments)    "Feet swelling", makes her feel weak. Pt takes 5mg  at home    Antimicrobials this admission: Cefepime 10/15 > Vancomycin 10/15 >  Microbiology results: 10/15 resp panel: neg 10/15 MRSA nares: pending  Thank you for allowing pharmacy to be a part of this patient's care.  Marja Kays 07/03/2023 11:05 PM

## 2023-07-04 ENCOUNTER — Inpatient Hospital Stay (HOSPITAL_COMMUNITY): Payer: Medicare Other

## 2023-07-04 ENCOUNTER — Other Ambulatory Visit: Payer: Self-pay

## 2023-07-04 DIAGNOSIS — J189 Pneumonia, unspecified organism: Secondary | ICD-10-CM | POA: Diagnosis not present

## 2023-07-04 DIAGNOSIS — E11649 Type 2 diabetes mellitus with hypoglycemia without coma: Secondary | ICD-10-CM

## 2023-07-04 DIAGNOSIS — R1310 Dysphagia, unspecified: Secondary | ICD-10-CM

## 2023-07-04 LAB — GLUCOSE, CAPILLARY
Glucose-Capillary: 253 mg/dL — ABNORMAL HIGH (ref 70–99)
Glucose-Capillary: 263 mg/dL — ABNORMAL HIGH (ref 70–99)
Glucose-Capillary: 219 mg/dL — ABNORMAL HIGH (ref 70–99)
Glucose-Capillary: 298 mg/dL — ABNORMAL HIGH (ref 70–99)

## 2023-07-04 LAB — CBC WITH DIFFERENTIAL/PLATELET
Abs Immature Granulocytes: 0.02 10*3/uL (ref 0.00–0.07)
Basophils Absolute: 0 10*3/uL (ref 0.0–0.1)
Basophils Relative: 1 %
Eosinophils Absolute: 0.1 10*3/uL (ref 0.0–0.5)
Eosinophils Relative: 1 %
HCT: 29 % — ABNORMAL LOW (ref 36.0–46.0)
Hemoglobin: 9.5 g/dL — ABNORMAL LOW (ref 12.0–15.0)
Immature Granulocytes: 0 %
Lymphocytes Relative: 13 %
Lymphs Abs: 0.7 10*3/uL (ref 0.7–4.0)
MCH: 31 pg (ref 26.0–34.0)
MCHC: 32.8 g/dL (ref 30.0–36.0)
MCV: 94.8 fL (ref 80.0–100.0)
Monocytes Absolute: 0.5 10*3/uL (ref 0.1–1.0)
Monocytes Relative: 10 %
Neutro Abs: 3.8 10*3/uL (ref 1.7–7.7)
Neutrophils Relative %: 75 %
Platelets: 256 10*3/uL (ref 150–400)
RBC: 3.06 MIL/uL — ABNORMAL LOW (ref 3.87–5.11)
RDW: 13 % (ref 11.5–15.5)
WBC: 5.1 10*3/uL (ref 4.0–10.5)
nRBC: 0 % (ref 0.0–0.2)

## 2023-07-04 LAB — COMPREHENSIVE METABOLIC PANEL
ALT: 15 U/L (ref 0–44)
AST: 18 U/L (ref 15–41)
Albumin: 2.9 g/dL — ABNORMAL LOW (ref 3.5–5.0)
Alkaline Phosphatase: 57 U/L (ref 38–126)
Anion gap: 12 (ref 5–15)
BUN: 53 mg/dL — ABNORMAL HIGH (ref 8–23)
CO2: 24 mmol/L (ref 22–32)
Calcium: 8.3 mg/dL — ABNORMAL LOW (ref 8.9–10.3)
Chloride: 89 mmol/L — ABNORMAL LOW (ref 98–111)
Creatinine, Ser: 2.97 mg/dL — ABNORMAL HIGH (ref 0.44–1.00)
GFR, Estimated: 15 mL/min — ABNORMAL LOW (ref >60)
Glucose, Bld: 227 mg/dL — ABNORMAL HIGH (ref 70–99)
Potassium: 4.3 mmol/L (ref 3.5–5.1)
Sodium: 125 mmol/L — ABNORMAL LOW (ref 135–145)
Total Bilirubin: 1 mg/dL (ref 0.3–1.2)
Total Protein: 5.6 g/dL — ABNORMAL LOW (ref 6.5–8.1)

## 2023-07-04 LAB — MRSA NEXT GEN BY PCR, NASAL: MRSA by PCR Next Gen: NOT DETECTED

## 2023-07-04 LAB — MAGNESIUM: Magnesium: 2.3 mg/dL (ref 1.7–2.4)

## 2023-07-04 MED ORDER — ACETAMINOPHEN 325 MG PO TABS: 650.0000 mg | ORAL_TABLET | Freq: Four times a day (QID) | ORAL | Status: DC | PRN

## 2023-07-04 MED ORDER — ALBUMIN HUMAN 25 % IV SOLN
12.5000 g | Freq: Once | INTRAVENOUS | Status: AC
  Administered 2023-07-04: 12.5 g via INTRAVENOUS
  Filled 2023-07-04: qty 50

## 2023-07-04 MED ORDER — ROSUVASTATIN CALCIUM 10 MG PO TABS
5.0000 mg | ORAL_TABLET | Freq: Every day | ORAL | Status: DC
  Administered 2023-07-04 – 2023-07-06 (×3): 5 mg via ORAL
  Filled 2023-07-04 (×3): qty 1

## 2023-07-04 MED ORDER — ONDANSETRON HCL 4 MG/2ML IJ SOLN: 4.0000 mg | Freq: Four times a day (QID) | INTRAMUSCULAR | Status: DC | PRN

## 2023-07-04 MED ORDER — NIFEDIPINE ER OSMOTIC RELEASE 30 MG PO TB24
60.0000 mg | ORAL_TABLET | Freq: Every day | ORAL | Status: DC
  Administered 2023-07-04 – 2023-07-06 (×3): 60 mg via ORAL
  Filled 2023-07-04 (×3): qty 2

## 2023-07-04 MED ORDER — ENSURE ENLIVE PO LIQD
237.0000 mL | Freq: Two times a day (BID) | ORAL | Status: DC
  Administered 2023-07-04 – 2023-07-05 (×4): 237 mL via ORAL
  Filled 2023-07-04 (×2): qty 237

## 2023-07-04 MED ORDER — ACETAMINOPHEN 650 MG RE SUPP: 650.0000 mg | Freq: Four times a day (QID) | RECTAL | Status: DC | PRN

## 2023-07-04 MED ORDER — ONDANSETRON HCL 4 MG PO TABS: 4.0000 mg | ORAL_TABLET | Freq: Four times a day (QID) | ORAL | Status: DC | PRN

## 2023-07-04 MED ORDER — HYDRALAZINE HCL 20 MG/ML IJ SOLN: 5.0000 mg | Freq: Four times a day (QID) | INTRAMUSCULAR | Status: DC | PRN

## 2023-07-04 MED ORDER — HYDRALAZINE HCL 25 MG PO TABS
100.0000 mg | ORAL_TABLET | Freq: Three times a day (TID) | ORAL | Status: DC
  Administered 2023-07-04 – 2023-07-06 (×7): 100 mg via ORAL
  Filled 2023-07-04 (×6): qty 4

## 2023-07-04 MED ORDER — FUROSEMIDE 10 MG/ML IJ SOLN
80.0000 mg | Freq: Two times a day (BID) | INTRAMUSCULAR | Status: DC
  Administered 2023-07-04 – 2023-07-05 (×3): 80 mg via INTRAVENOUS
  Filled 2023-07-04 (×3): qty 8

## 2023-07-04 MED ORDER — SODIUM CHLORIDE 0.9 % IV SOLN: INTRAVENOUS | Status: DC

## 2023-07-04 MED ORDER — INSULIN GLARGINE-YFGN 100 UNIT/ML ~~LOC~~ SOLN
15.0000 [IU] | Freq: Every day | SUBCUTANEOUS | Status: DC
  Administered 2023-07-04 – 2023-07-05 (×2): 15 [IU] via SUBCUTANEOUS
  Filled 2023-07-04 (×4): qty 0.15

## 2023-07-04 MED ORDER — FUROSEMIDE 10 MG/ML IJ SOLN: 40.0000 mg | Freq: Every day | INTRAMUSCULAR | Status: DC

## 2023-07-04 MED ORDER — CARVEDILOL 12.5 MG PO TABS
25.0000 mg | ORAL_TABLET | Freq: Two times a day (BID) | ORAL | Status: DC
  Administered 2023-07-04 – 2023-07-06 (×5): 25 mg via ORAL
  Filled 2023-07-04 (×5): qty 2

## 2023-07-04 MED ORDER — MORPHINE SULFATE (PF) 2 MG/ML IV SOLN: 2.0000 mg | INTRAVENOUS | Status: DC | PRN

## 2023-07-04 MED ORDER — SODIUM CHLORIDE 0.9 % IV SOLN
1.0000 g | INTRAVENOUS | Status: DC
  Administered 2023-07-04: 1 g via INTRAVENOUS
  Filled 2023-07-04 (×2): qty 10

## 2023-07-04 MED ORDER — VANCOMYCIN VARIABLE DOSE PER UNSTABLE RENAL FUNCTION (PHARMACIST DOSING): Status: DC

## 2023-07-04 MED ORDER — PANTOPRAZOLE SODIUM 40 MG IV SOLR
40.0000 mg | Freq: Two times a day (BID) | INTRAVENOUS | Status: DC
  Administered 2023-07-04 – 2023-07-06 (×5): 40 mg via INTRAVENOUS
  Filled 2023-07-04 (×5): qty 10

## 2023-07-04 MED ORDER — OXYCODONE HCL 5 MG PO TABS: 5.0000 mg | ORAL_TABLET | ORAL | Status: DC | PRN

## 2023-07-04 MED ORDER — INSULIN ASPART 100 UNIT/ML IJ SOLN
0.0000 [IU] | Freq: Three times a day (TID) | INTRAMUSCULAR | Status: DC
  Administered 2023-07-04 (×2): 5 [IU] via SUBCUTANEOUS
  Administered 2023-07-04: 3 [IU] via SUBCUTANEOUS
  Administered 2023-07-05: 1 [IU] via SUBCUTANEOUS
  Administered 2023-07-05 (×2): 5 [IU] via SUBCUTANEOUS

## 2023-07-04 MED ORDER — INSULIN ASPART 100 UNIT/ML IJ SOLN
0.0000 [IU] | Freq: Every day | INTRAMUSCULAR | Status: DC
  Administered 2023-07-04: 3 [IU] via SUBCUTANEOUS

## 2023-07-04 MED ORDER — ALBUMIN HUMAN 25 % IV SOLN
12.5000 g | Freq: Once | INTRAVENOUS | Status: DC
  Administered 2023-07-04: 12.5 g via INTRAVENOUS
  Filled 2023-07-04: qty 50

## 2023-07-04 MED ORDER — HEPARIN SODIUM (PORCINE) 5000 UNIT/ML IJ SOLN
5000.0000 [IU] | Freq: Three times a day (TID) | INTRAMUSCULAR | Status: DC
  Administered 2023-07-04 – 2023-07-06 (×6): 5000 [IU] via SUBCUTANEOUS
  Filled 2023-07-04 (×6): qty 1

## 2023-07-04 NOTE — Assessment & Plan Note (Signed)
Continue Crestor 

## 2023-07-04 NOTE — Evaluation (Signed)
Clinical/Bedside Swallow Evaluation Patient Details  Name: Emily Jennings MRN: [id number] Date of Birth: 09/17/1934  Today's Date: 07/04/2023 Time: SLP Start Time (ACUTE ONLY): 1455 SLP Stop Time (ACUTE ONLY): 1512 SLP Time Calculation (min) (ACUTE ONLY): 17 min  Past Medical History:  Past Medical History:  Diagnosis Date   Allergy    Anxiety    ANXIETY DISORDER, GENERALIZED 07/15/2007   Qualifier: Diagnosis of  By: Chipper Herb     Arthritis    Cancer [facility]) 2009   breast, carcinoma in situ left   Carcinoma in situ of breast 05/21/2008   Qualifier: Diagnosis of  By: [name] MD, Margaret  Diagnosed in 2009, completed 5 year course of tamoxifen, no evidence of recurrence    Carotid stenosis    11/16/2005  mild plaque formation and stenosis proximal right ECA   Cataract    Complication of anesthesia    Coronary artery disease    cardiac catheterization on 03/20/2006  LAD mid 40% stenosis, left circumflex mild 40% stenosis, RCA mid-vessel 40% to 50% lesion   EF 60%   Diabetes mellitus    GERD (gastroesophageal reflux disease)    Hernia, inguinal    left   Hyperglycemia    Hypertension    Insomnia 11/16/2011   Low blood potassium    Non-insulin dependent type 2 diabetes mellitus (HCC)    Osteoporosis    Shortness of breath    2D Echocardiogram 01/26/2009   EF of greater than 55%, mild MR, mild TR, normal ventricular function   Thickened endometrium 10/26/2017   Noted by gyne in 2017, missed 6 month follow up, referred in 09/2017   Ventricular tachycardia, non-sustained (HCC)    developed during stress test 02/08/2006, spontaneously aborted, mild reversible apical defect   Past Surgical History:  Past Surgical History:  Procedure Laterality Date   BREAST LUMPECTOMY Left 2009   Left breast 2009   CATARACT EXTRACTION W/PHACO Left 10/28/2014   Procedure: PHACO EMULSION CATARACT EXTRACTION WITH INTRAOCULAR LENS IMPLANT LEFT EYE (IOC);  Surgeon: Chalmers Guest, MD;  Location: [facility];   Service: Ophthalmology;  Laterality: Left;   COLONOSCOPY     cyst removed from left foot     REFRACTIVE SURGERY Left    HPI:  Emily Jennings is a 89 y.o. female with medical history significant of anxiety, diabetes mellitus type 2, GERD, hypertension, murmur, and more presents the ED with a chief complaint of dyspnea.  Patient reports that she has had dyspnea for several days.  She reports it seems to been getting worse actually since she got out of the hospital last time.  She reports compliance with her medication.  She denies any chest pain, abdominal pain.  She denies palpitations.  She has not had any fever.  She does have a cough, which is sometimes productive.  The sputum is clear or white.  Patient's only other complaint is dysphagia.  She reports that since she got on the hospital last she has felt like she cannot swallow properly.  It has affected her appetite.    Assessment / Plan / Recommendation  Clinical Impression  Clinical swallowing evaluation completed while Pt was sitting upright in the chair. Pt reports having "a hard time swallowing but it doesn't hurt". Pt seems to articulate globus sensation therefore question esophageal component. Pt consumed thin liquids, regular textures (graham crackers) without overt s/sx of oropharyngeal dysphagia. Pt does report typically eating a very soft diet. Recommend downgrade Pt's diet to D2/fine chop diet  and continue with thin liquids. Recommend consider GI referral. There are no further ST needs noted at this time, thank you for this referral. SLP Visit Diagnosis: Dysphagia, oropharyngeal phase (R13.12)    Aspiration Risk  Mild aspiration risk    Diet Recommendation Dysphagia 2 (Fine chop);Thin liquid    Liquid Administration via: Cup;Straw Medication Administration: Whole meds with liquid Supervision: Patient able to self feed Compensations: Minimize environmental distractions Postural Changes: Seated upright at 90 degrees    Other   Recommendations Oral Care Recommendations: Oral care BID    Recommendations for follow up therapy are one component of a multi-disciplinary discharge planning process, led by the attending physician.  Recommendations may be updated based on patient status, additional functional criteria and insurance authorization.  Follow up Recommendations No SLP follow up         Functional Status Assessment Patient has had a recent decline in their functional status and demonstrates the ability to make significant improvements in function in a reasonable and predictable amount of time.    Swallow Study   General HPI: Emily Jennings is a 89 y.o. female with medical history significant of anxiety, diabetes mellitus type 2, GERD, hypertension, murmur, and more presents the ED with a chief complaint of dyspnea.  Patient reports that she has had dyspnea for several days.  She reports it seems to been getting worse actually since she got out of the hospital last time.  She reports compliance with her medication.  She denies any chest pain, abdominal pain.  She denies palpitations.  She has not had any fever.  She does have a cough, which is sometimes productive.  The sputum is clear or white.  Patient's only other complaint is dysphagia.  She reports that since she got on the hospital last she has felt like she cannot swallow properly.  It has affected her appetite. Type of Study: Bedside Swallow Evaluation Previous Swallow Assessment: none in chart Diet Prior to this Study: Regular;Thin liquids (Level 0) Temperature Spikes Noted: No Respiratory Status: Room air History of Recent Intubation: No Behavior/Cognition: Alert;Cooperative;Pleasant mood Oral Cavity Assessment: Within Functional Limits Oral Care Completed by SLP: Yes Oral Cavity - Dentition: Adequate natural dentition Vision: Functional for self-feeding Self-Feeding Abilities: Able to feed self Patient Positioning: Upright in chair Baseline Vocal  Quality: Normal Volitional Cough: Strong Volitional Swallow: Able to elicit    Oral/Motor/Sensory Function Overall Oral Motor/Sensory Function: Within functional limits   Ice Chips Ice chips: Within functional limits   Thin Liquid Thin Liquid: Within functional limits    Nectar Thick Nectar Thick Liquid: Not tested   Honey Thick Honey Thick Liquid: Not tested   Puree Puree: Within functional limits   Solid     Solid: Within functional limits     Hendrix Yurkovich H. Romie Levee, CCC-SLP Speech Language Pathologist  Georgetta Haber 07/04/2023,3:16 PM

## 2023-07-04 NOTE — Assessment & Plan Note (Signed)
-  Continue beta-blocker and statin

## 2023-07-04 NOTE — Assessment & Plan Note (Addendum)
-   Creatinine baseline 2.09 creatinine admission 2.95 - Patient was given Lasix in the ED and that increased her creatinine to 2.97 - After Lasix her sodium also dropped from 131->125 - Holding Lasix - Starting fluids - Holding nephrotoxic agents when possible - Continue to monitor

## 2023-07-04 NOTE — Assessment & Plan Note (Signed)
-   Patient was recently hospitalized - Continue vancomycin and cefepime - Expectorated sputum culture - Legionella and strep urine antigens - Blood cultures pending - Continue to monitor

## 2023-07-04 NOTE — ED Notes (Signed)
Pt was cleaned up and changed. New purewick in place. Resting at this time.

## 2023-07-04 NOTE — Inpatient Diabetes Management (Signed)
Inpatient Diabetes Program Recommendations  AACE/ADA: New Consensus Statement on Inpatient Glycemic Control (2015)  Target Ranges:  Prepandial:   less than 140 mg/dL      Peak postprandial:   less than 180 mg/dL (1-2 hours)      Critically ill patients:  140 - 180 mg/dL   Lab Results  Component Value Date   GLUCAP 263 (H) 07/04/2023   HGBA1C 7.5 (A) 05/01/2023    Review of Glycemic Control  Latest Reference Range & Units 07/03/23 23:11 07/03/23 23:16 07/04/23 07:25 07/04/23 11:15  Glucose-Capillary 70 - 99 mg/dL 161 (H) 096 (H) 045 (H) 263 (H)   Diabetes history: DM 2 Outpatient Diabetes medications:  Tresiba 5 units daily Humalog 5 units tid with meals (only if CBG>100 mg/dL) Current orders for Inpatient glycemic control:  Novolog sensitive tid with meals and HS Semglee 15 units daily  Inpatient Diabetes Program Recommendations:   Note hypoglycemia on admit and during last admission.   Consider reducing Semglee to 7 units daily.  Also consider reducing Novolog correction to "Very Sensitive"- 0-6 units and add Novolog meal coverage 2 units tid with meals.  Thanks,  Beryl Meager, RN, BC-ADM Inpatient Diabetes Coordinator Pager [phone]  (8a-5p)

## 2023-07-04 NOTE — Progress Notes (Signed)
PROGRESS NOTE    Emily Jennings  GEX:[id number] DOB: 03/04/1934 DOA: 07/03/2023 PCP: [name], MD  89/F with diastolic CHF, CKD 4, type 2 diabetes mellitus, hypertension, dyslipidemia was recently hospitalized at [facility] with volume overload, CHF exacerbation, improved with diuretics, subsequently discharged home.  Patient reports marginal improvement in symptoms then, had worsening dyspnea for the last 3 days, presented back to [facility], ED 10/15, some symptoms of dysphagia and cough,  history of orthopnea and some lower extremity edema as well In the ED WBC 5.9, hemoglobin 10.9, troponin 11, BNP 630, creatinine 2.9, chest x-ray with worsening left lower lobe infiltration,?  Bronchopneumonia, small pleural effusions  Subjective: -Still has some dyspnea, coughing  Assessment and Plan:   Acute hypoxic respiratory failure CHF +- aspiration pneumonia -Suspect this is combination of fluid overload with?  Pneumonia as well, symptoms concerning for dysphagia, aspiration is a possibility however CHF also likely contributing -Discontinue vancomycin, continue cefepime today -Check SLP eval -Also check noncontrast CT chest -Debility, check PT OT eval as well  Acute on chronic diastolic CHF -Last echo 8/24 with EF 65%, grade 2 diastolic dysfunction, normal RV -Clinically appears volume overloaded -Add IV Lasix today, has some hypoalbuminemia as well which is contributing to third spacing -GDMT limited by CKD 4, continue beta-blocker  Hyponatremia -Likely from volume overloaded state, monitor with diuresis  AKI on CKD 4 -Baseline creatinine around 2-2.4, now 2.9 -Discontinue IV fluids, restart IV Lasix, clinically appears volume overloaded  Uncontrolled type 2 diabetes mellitus  - Hypoglycemic initially, now CBGs in the 200s, restart glargine  Hyperlipidemia - Continue Crestor  Hypertension Continue nifedipine  GERD (gastroesophageal reflux disease) - PPI   DVT  prophylaxis: Heparin subcutaneous Code Status: Full code Family Communication: No family at bedside today Disposition Plan: Home likely 2 to 3 days  Consultants:    Procedures:   Antimicrobials:    Objective: Vitals:   07/04/23 0200 07/04/23 0400 07/04/23 0501 07/04/23 0730  BP: (!) 155/67 (!) 151/72 (!) 163/71 (!) 179/83  Pulse: 80 69 84 84  Resp: (!) 22 14 18 16   Temp:   98.1 F (36.7 C) 98.3 F (36.8 C)  TempSrc:   Oral   SpO2: 96% 94% 94% 98%  Weight:  53.5 kg    Height:        Intake/Output Summary (Last 24 hours) at 07/04/2023 1012 Last data filed at 07/04/2023 0900 Gross per 24 hour  Intake 541.75 ml  Output 1350 ml  Net -808.25 ml   Filed Weights   07/03/23 1522 07/04/23 0400  Weight: 54.4 kg 53.5 kg    Examination:  General exam: Appears calm and comfortable  Respiratory system: Clear to auscultation Cardiovascular system: S1 & S2 heard, RRR.  Abd: nondistended, soft and nontender.Normal bowel sounds heard. Central nervous system: Alert and oriented. No focal neurological deficits. Extremities: no edema Skin: No rashes Psychiatry:  Mood & affect appropriate.     Data Reviewed:   CBC: Recent Labs  Lab 07/03/23 1557 07/04/23 0045  WBC 5.9 5.1  NEUTROABS 4.4 3.8  HGB 10.9* 9.5*  HCT 33.9* 29.0*  MCV 95.0 94.8  PLT 274 256   Basic Metabolic Panel: Recent Labs  Lab 07/03/23 1557 07/04/23 0045  NA 131* 125*  K 4.3 4.3  CL 92* 89*  CO2 27 24  GLUCOSE 64* 227*  BUN 51* 53*  CREATININE 2.95* 2.97*  CALCIUM 9.0 8.3*  MG  --  2.3   GFR: Estimated Creatinine Clearance:  9.6 mL/min (A) (by C-G formula based on SCr of 2.97 mg/dL (H)). Liver Function Tests: Recent Labs  Lab 07/03/23 1557 07/04/23 0045  AST 20 18  ALT 13 15  ALKPHOS 61 57  BILITOT 0.7 1.0  PROT 6.4* 5.6*  ALBUMIN 3.3* 2.9*   No results for input(s): "LIPASE", "AMYLASE" in the last 168 hours. No results for input(s): "AMMONIA" in the last 168 hours. Coagulation  Profile: No results for input(s): "INR", "PROTIME" in the last 168 hours. Cardiac Enzymes: No results for input(s): "CKTOTAL", "CKMB", "CKMBINDEX", "TROPONINI" in the last 168 hours. BNP (last 3 results) No results for input(s): "PROBNP" in the last 8760 hours. HbA1C: No results for input(s): "HGBA1C" in the last 72 hours. CBG: Recent Labs  Lab 07/03/23 1940 07/03/23 2311 07/03/23 2316 07/04/23 0725  GLUCAP 191* 216* 189* 253*   Lipid Profile: No results for input(s): "CHOL", "HDL", "LDLCALC", "TRIG", "CHOLHDL", "LDLDIRECT" in the last 72 hours. Thyroid Function Tests: No results for input(s): "TSH", "T4TOTAL", "FREET4", "T3FREE", "THYROIDAB" in the last 72 hours. Anemia Panel: No results for input(s): "VITAMINB12", "FOLATE", "FERRITIN", "TIBC", "IRON", "RETICCTPCT" in the last 72 hours. Urine analysis:    Component Value Date/Time   COLORURINE YELLOW 05/20/2023 1535   APPEARANCEUR CLEAR 05/20/2023 1535   LABSPEC 1.013 05/20/2023 1535   PHURINE 5.0 05/20/2023 1535   GLUCOSEU 50 (A) 05/20/2023 1535   HGBUR NEGATIVE 05/20/2023 1535   HGBUR trace-intact 04/28/2010 1259   BILIRUBINUR NEGATIVE 05/20/2023 1535   BILIRUBINUR neg 09/29/2013 1341   KETONESUR NEGATIVE 05/20/2023 1535   PROTEINUR >=300 (A) 05/20/2023 1535   UROBILINOGEN 0.2 09/29/2013 1341   UROBILINOGEN 0.2 12/28/2010 1743   NITRITE NEGATIVE 05/20/2023 1535   LEUKOCYTESUR NEGATIVE 05/20/2023 1535   Sepsis Labs: @LABRCNTIP (procalcitonin:4,lacticidven:4)  ) Recent Results (from the past 240 hour(s))  Resp panel by RT-PCR (RSV, Flu A&B, Covid) Anterior Nasal Swab     Status: None   Collection Time: 07/03/23  8:40 PM   Specimen: Anterior Nasal Swab  Result Value Ref Range Status   SARS Coronavirus 2 by RT PCR NEGATIVE NEGATIVE Final    Comment: (NOTE) SARS-CoV-2 target nucleic acids are NOT DETECTED.  The SARS-CoV-2 RNA is generally detectable in upper respiratory specimens during the acute phase of  infection. The lowest concentration of SARS-CoV-2 viral copies this assay can detect is 138 copies/mL. A negative result does not preclude SARS-Cov-2 infection and should not be used as the sole basis for treatment or other patient management decisions. A negative result may occur with  improper specimen collection/handling, submission of specimen other than nasopharyngeal swab, presence of viral mutation(s) within the areas targeted by this assay, and inadequate number of viral copies(<138 copies/mL). A negative result must be combined with clinical observations, patient history, and epidemiological information. The expected result is Negative.  Fact Sheet for Patients:  BloggerCourse.com  Fact Sheet for Healthcare Providers:  SeriousBroker.it  This test is no t yet approved or cleared by the [place] FDA and  has been authorized for detection and/or diagnosis of SARS-CoV-2 by FDA under an Emergency Use Authorization (EUA). This EUA will remain  in effect (meaning this test can be used) for the duration of the COVID-19 declaration under Section 564(b)(1) of the Act, 21 U.S.C.section 360bbb-3(b)(1), unless the authorization is terminated  or revoked sooner.       Influenza A by PCR NEGATIVE NEGATIVE Final   Influenza B by PCR NEGATIVE NEGATIVE Final    Comment: (NOTE) The Xpert Xpress SARS-CoV-2/FLU/RSV  plus assay is intended as an aid in the diagnosis of influenza from Nasopharyngeal swab specimens and should not be used as a sole basis for treatment. Nasal washings and aspirates are unacceptable for Xpert Xpress SARS-CoV-2/FLU/RSV testing.  Fact Sheet for Patients: BloggerCourse.com  Fact Sheet for Healthcare Providers: SeriousBroker.it  This test is not yet approved or cleared by the [place] FDA and has been authorized for detection and/or diagnosis of SARS-CoV-2  by FDA under an Emergency Use Authorization (EUA). This EUA will remain in effect (meaning this test can be used) for the duration of the COVID-19 declaration under Section 564(b)(1) of the Act, 21 U.S.C. section 360bbb-3(b)(1), unless the authorization is terminated or revoked.     Resp Syncytial Virus by PCR NEGATIVE NEGATIVE Final    Comment: (NOTE) Fact Sheet for Patients: BloggerCourse.com  Fact Sheet for Healthcare Providers: SeriousBroker.it  This test is not yet approved or cleared by the [place] FDA and has been authorized for detection and/or diagnosis of SARS-CoV-2 by FDA under an Emergency Use Authorization (EUA). This EUA will remain in effect (meaning this test can be used) for the duration of the COVID-19 declaration under Section 564(b)(1) of the Act, 21 U.S.C. section 360bbb-3(b)(1), unless the authorization is terminated or revoked.  Performed at [facility], [address], [place], [place] [address]   Culture, blood (routine x 2) Call MD if unable to obtain prior to antibiotics being given     Status: None (Preliminary result)   Collection Time: 07/04/23 12:45 AM   Specimen: BLOOD  Result Value Ref Range Status   Specimen Description BLOOD BLOOD RIGHT FOREARM  Final   Special Requests   Final    BOTTLES DRAWN AEROBIC AND ANAEROBIC Blood Culture results may not be optimal due to an excessive volume of blood received in culture bottles   Culture   Final    NO GROWTH < 12 HOURS Performed at [facility], [address], [place], [place] [address]    Report Status PENDING  Incomplete  Culture, blood (routine x 2) Call MD if unable to obtain prior to antibiotics being given     Status: None (Preliminary result)   Collection Time: 07/04/23 12:45 AM   Specimen: BLOOD  Result Value Ref Range Status   Specimen Description BLOOD BLOOD RIGHT HAND  Final   Special Requests   Final    BOTTLES DRAWN AEROBIC AND  ANAEROBIC Blood Culture results may not be optimal due to an excessive volume of blood received in culture bottles   Culture   Final    NO GROWTH < 12 HOURS Performed at [facility], [address], [place], [place] [address]    Report Status PENDING  Incomplete     Radiology Studies: DG Chest 2 View  Result Date: 07/03/2023 CLINICAL DATA:  Shortness of breath.  History of left breast cancer. EXAM: CHEST - 2 VIEW COMPARISON:  06/18/2023 FINDINGS: Heart size is normal. Aortic atherosclerosis as seen previously. Small pleural effusions in both posterior costophrenic angles. Worsening infiltrate in the left lower lobe consistent with bronchopneumonia. No sign of heart failure. No focal bone lesion. IMPRESSION: Worsening left lower lobe infiltrate consistent with bronchopneumonia. Small pleural effusions in both posterior costophrenic angles. Electronically Signed   By: Paulina Fusi M.D.   On: 07/03/2023 18:11     Scheduled Meds:  carvedilol  25 mg Oral BID WC   feeding supplement  237 mL Oral BID BM   heparin  5,000 Units Subcutaneous  Q8H   hydrALAZINE  100 mg Oral TID   insulin aspart  0-5 Units Subcutaneous QHS   insulin aspart  0-9 Units Subcutaneous TID WC   NIFEdipine  60 mg Oral Daily   pantoprazole (PROTONIX) IV  40 mg Intravenous Q12H   rosuvastatin  5 mg Oral Daily   vancomycin variable dose per unstable renal function (pharmacist dosing)   Does not apply See admin instructions   Continuous Infusions:  ceFEPime (MAXIPIME) IV       LOS: 1 day    Time spent:    Zannie Cove, MD [facility]   07/04/2023, 10:12 AM

## 2023-07-04 NOTE — Assessment & Plan Note (Signed)
-   Initial glucose was 64 - Improved to 191, but it is not known if there was intervention between the 2 - Holding basal insulin - Using sensitive sliding scale - Monitor CBGs - Continue to monitor

## 2023-07-04 NOTE — Assessment & Plan Note (Signed)
-   Patient complaining of dysphagia - May be related to her GERD - Continue PPI - Consult speech - Continue to monitor

## 2023-07-04 NOTE — ED Provider Notes (Incomplete)
[place] EMERGENCY DEPARTMENT AT [facility] Provider Note   CSN: [id number] Arrival date & time: 07/03/23  1515     History Chief Complaint  Patient presents with  . Shortness of Breath    Emily Jennings is a 89 y.o. female.   Shortness of Breath      Home Medications Prior to Admission medications   Medication Sig Start Date End Date Taking? Authorizing Provider  calcitRIOL (ROCALTROL) 0.25 MCG capsule Take 0.25 mcg by mouth 3 (three) times a week.    [provider]  carvedilol (COREG) 25 MG tablet Take 1 tablet (25 mg total) by mouth 2 (two) times daily with a meal. 05/28/23   Danford, Earl Lites, MD  cholecalciferol (VITAMIN D3) 25 MCG (1000 UNIT) tablet Take 1,000 Units by mouth daily.    [provider]  furosemide (LASIX) 40 MG tablet Take 1 tablet (40 mg total) by mouth daily. 06/18/23 06/17/24  Johnson, Clanford L, MD  hydrALAZINE (APRESOLINE) 100 MG tablet Take 1 tablet (100 mg total) by mouth 3 (three) times daily. 05/28/23   Danford, Earl Lites, MD  insulin degludec (TRESIBA FLEXTOUCH) 100 UNIT/ML FlexTouch Pen Inject 5 Units into the skin daily. 05/01/23   Shamleffer, Konrad Dolores, MD  insulin lispro (HUMALOG KWIKPEN) 100 UNIT/ML KwikPen Inject 5 Units into the skin 3 (three) times daily. Max daily 30 units 05/01/23   Shamleffer, Konrad Dolores, MD  NIFEdipine (ADALAT CC) 60 MG 24 hr tablet Take 1 tablet (60 mg total) by mouth daily. 05/29/23   Danford, Earl Lites, MD  potassium chloride (KLOR-CON M) 10 MEQ tablet Take 1 tablet (10 mEq total) by mouth every other day. Take only when taking furosemide 06/05/23   Cleora Fleet, MD  Potassium Chloride ER 20 MEQ TBCR TAKE 1 TABLET DAILY BY MOUTH TAKE WHILE TAKING LASIX/FUROSEMIDE 06/27/23   [name], MD  rosuvastatin (CRESTOR) 5 MG tablet TAKE 1 TABLET (5 MG TOTAL) BY MOUTH DAILY. Patient taking differently: Take 5 mg by mouth daily. 01/15/23   [name], MD   VITAMIN A-BETA CAROTENE PO Take 1 tablet by mouth daily.    [provider]      Allergies    Amlodipine, Benadryl [diphenhydramine hcl], Citalopram, Farxiga [dapagliflozin], Metformin and related, Tramadol, and Crestor [rosuvastatin]    Review of Systems   Review of Systems  Respiratory:  Positive for shortness of breath.     Physical Exam Updated Vital Signs BP (!) 116/55 (BP Location: Left Arm)   Pulse 67   Temp 97.7 F (36.5 C) (Oral)   Resp (!) 24   Ht 5' (1.524 m)   Wt 54.4 kg   SpO2 96%   BMI 23.44 kg/m  Physical Exam  ED Results / Procedures / Treatments   Labs (all labs ordered are listed, but only abnormal results are displayed) Labs Reviewed  CBC WITH DIFFERENTIAL/PLATELET - Abnormal; Notable for the following components:      Result Value   RBC 3.57 (*)    Hemoglobin 10.9 (*)    HCT 33.9 (*)    All other components within normal limits  COMPREHENSIVE METABOLIC PANEL - Abnormal; Notable for the following components:   Sodium 131 (*)    Chloride 92 (*)    Glucose, Bld 64 (*)    BUN 51 (*)    Creatinine, Ser 2.95 (*)    Total Protein 6.4 (*)    Albumin 3.3 (*)    GFR, Estimated 15 (*)  All other components within normal limits  BRAIN NATRIURETIC PEPTIDE - Abnormal; Notable for the following components:   B Natriuretic Peptide 630.0 (*)    All other components within normal limits  CBG MONITORING, ED - Abnormal; Notable for the following components:   Glucose-Capillary 191 (*)    All other components within normal limits  RESP PANEL BY RT-PCR (RSV, FLU A&B, COVID)  RVPGX2  PROCALCITONIN  TROPONIN I (HIGH SENSITIVITY)  TROPONIN I (HIGH SENSITIVITY)    EKG None  Radiology DG Chest 2 View  Result Date: 07/03/2023 CLINICAL DATA:  Shortness of breath.  History of left breast cancer. EXAM: CHEST - 2 VIEW COMPARISON:  06/18/2023 FINDINGS: Heart size is normal. Aortic atherosclerosis as seen previously. Small pleural effusions in both  posterior costophrenic angles. Worsening infiltrate in the left lower lobe consistent with bronchopneumonia. No sign of heart failure. No focal bone lesion. IMPRESSION: Worsening left lower lobe infiltrate consistent with bronchopneumonia. Small pleural effusions in both posterior costophrenic angles. Electronically Signed   By: Paulina Fusi M.D.   On: 07/03/2023 18:11    Procedures Procedures  {Document cardiac monitor, telemetry assessment procedure when appropriate:1}  Medications Ordered in ED Medications  albuterol (VENTOLIN HFA) 108 (90 Base) MCG/ACT inhaler 2 puff (has no administration in time range)  furosemide (LASIX) injection 40 mg (has no administration in time range)  ceFEPIme (MAXIPIME) 1 g in sodium chloride 0.9 % 100 mL IVPB (has no administration in time range)    ED Course/ Medical Decision Making/ A&P   {   Click here for ABCD2, HEART and other calculatorsREFRESH Note before signing :1}                              Medical Decision Making Amount and/or Complexity of Data Reviewed Labs: ordered. Radiology: ordered.  Risk Prescription drug management. Decision regarding hospitalization.   89 y.o. female presents to the ER for evaluation of ***. Differential diagnosis includes but is not limited to ***. Vital signs ***. Physical exam as noted above.   I independently reviewed and interpreted the patient's labs. ***.    Portions of this report may have been transcribed using voice recognition software. Every effort was made to ensure accuracy; however, inadvertent computerized transcription errors may be present.   I discussed this case with my attending physician who cosigned this note including patient's presenting symptoms, physical exam, and planned diagnostics and interventions. Attending physician stated agreement with plan or made changes to plan which were implemented.   Attending physician assessed patient at bedside.  Final Clinical Impression(s) / ED  Diagnoses Final diagnoses:  None    Rx / DC Orders ED Discharge Orders     None

## 2023-07-04 NOTE — Assessment & Plan Note (Signed)
PPI ?

## 2023-07-04 NOTE — TOC Initial Note (Signed)
Transition of Care [facility]) - Initial/Assessment Note    Patient Details  Name: Emily Jennings MRN: [id number] Date of Birth: March 15, 1934  Transition of [facility]) CM/SW Contact:    Karn Cassis, LCSW Phone Number: 07/04/2023, 8:29 AM  Clinical Narrative: Pt admitted for healthcare-associated pneumonia. Assessment completed due to high risk readmission score and CHF screening consult. Pt lives with husband and son. She is fairly independent with ADLs and ambulates with a walker at baseline. Pt plans to return home at d/c. CHF screening completed. Per husband, pt was diagnosed with CHF fairly recently. Husband indicates she is weighing herself daily, following heart healthy diet, and taking medications as prescribed. CHF education added to AVS. TOC will follow.                    Expected Discharge Plan: Home/Self Care Barriers to Discharge: Continued Medical Work up   Patient Goals and CMS Choice Patient states their goals for this hospitalization and ongoing recovery are:: return home   Choice offered to / list presented to : Spouse [place] ownership interest in [facility].provided to::  (n/a)    Expected Discharge Plan and Services In-house Referral: Clinical Social Work     Living arrangements for the past 2 months: Single Family Home                                      Prior Living Arrangements/Services Living arrangements for the past 2 months: Single Family Home Lives with:: Spouse, Adult Children Patient language and need for interpreter reviewed:: Yes Do you feel safe going back to the place where you live?: Yes      Need for Family Participation in Patient Care: No (Comment)   Current home services: DME (cane, walker, shower chair) Criminal Activity/Legal Involvement Pertinent to Current Situation/Hospitalization: No - Comment as needed  Activities of Daily Living   ADL Screening (condition at time of admission) Independently  performs ADLs?: No Does the patient have a NEW difficulty with bathing/dressing/toileting/self-feeding that is expected to last >3 days?: No Does the patient have a NEW difficulty with getting in/out of bed, walking, or climbing stairs that is expected to last >3 days?: No Does the patient have a NEW difficulty with communication that is expected to last >3 days?: No Is the patient deaf or have difficulty hearing?: No Does the patient have difficulty seeing, even when wearing glasses/contacts?: No Does the patient have difficulty concentrating, remembering, or making decisions?: No  Permission Sought/Granted                  Emotional Assessment         Alcohol / Substance Use: Not Applicable Psych Involvement: No (comment)  Admission diagnosis:  SOB (shortness of breath) [R06.02] AKI (acute kidney injury) (HCC) [N17.9] HCAP (healthcare-associated pneumonia) [J18.9] Patient Active Problem List   Diagnosis Date Noted   Uncontrolled type 2 diabetes mellitus with hypoglycemia, with long-term current use of insulin (HCC) 07/04/2023   Dysphagia 07/04/2023   HCAP (healthcare-associated pneumonia) 07/03/2023   Essential hypertension 06/15/2023   Acute on chronic renal failure (HCC) 06/02/2023   Acute diastolic CHF (congestive heart failure) (HCC) 06/01/2023   Cerebellar edema (HCC) 05/23/2023   Hyperlipidemia 05/23/2023   DNR (do not resuscitate) 05/17/2023   Vocal cord dysfunction 05/17/2023   DNR (do not resuscitate) discussion 05/17/2023   Goals of care, counseling/discussion  05/17/2023   Right cerebellar intraparenchymal hemorrhage due to hypertension 05/16/2023   Unsteady gait when walking 04/03/2023   Hospital discharge follow-up 03/06/2023   Bilateral kidney stones 03/06/2023   Hip swelling, right 02/27/2023   DKA (diabetic ketoacidosis) (HCC) 02/17/2023   High anion gap metabolic acidosis 02/17/2023   Pseudohyponatremia 02/17/2023   Hypoalbuminemia due to  protein-calorie malnutrition (HCC) 02/17/2023   Dehydration 02/17/2023   Hip pain 02/15/2023   Encounter for Medicare annual examination with abnormal findings 10/16/2022   Vitamin D deficiency 10/16/2022   Acute exacerbation of CHF (congestive heart failure) (HCC) 09/13/2022   Acute on chronic congestive heart failure with left ventricular diastolic dysfunction (HCC) 09/13/2022   Chronic kidney disease (CKD) stage G3b/A2, moderately decreased glomerular filtration rate (GFR) between 30-44 mL/min/1.73 square meter and albuminuria creatinine ratio between 30-299 mg/g (HCC) 09/13/2022   Mild aortic valve stenosis 09/13/2022   Dyspnea on exertion 09/05/2022   CHF (congestive heart failure) (HCC) 09/05/2022   Acute on chronic diastolic (congestive) heart failure (HCC) 09/05/2022   Hyperkalemia 04/09/2022   Hyperglycemic crisis due to diabetes mellitus (HCC) 03/25/2022   Hyponatremia 03/25/2022   Acute metabolic encephalopathy 03/22/2022   Hypothermia 03/22/2022   Heart murmur 03/22/2022   Bilateral lower extremity edema 03/22/2022   Syncope and collapse 01/13/2021   Lip swelling 10/30/2020   CKD stage 4 due to type 2 diabetes mellitus (HCC) 10/30/2020   Tubular adenoma of colon 10/26/2017   Osteoporosis 07/28/2014   Diabetic hypertension-nephrosis syndrome (HCC) 03/23/2014   Type 2 diabetes mellitus with stage 3a chronic kidney disease, with long-term current use of insulin (HCC) 09/29/2013   Allergic rhinitis 01/27/2013   GERD (gastroesophageal reflux disease) 01/12/2013   CVD (cardiovascular disease) 07/17/2011   Hypokalemia 08/31/2008   Hyperlipidemia LDL goal <100 07/15/2007   Resistant hypertension 07/15/2007   PCP:  [name], MD Pharmacy:   [phone] - [place], [place] - [address] AT [facility] [address] [place] [address] Phone: [phone] Fax: [phone]  [facility] - [place], [place] - [address] [facility] [address] [place] [address] [place] [address] Phone: [phone] Fax: [phone]  [facility] [address] [place] [address] Phone: [phone] Fax: [phone]     Social Determinants of Health (SDOH) Social History: SDOH Screenings   Food Insecurity: No Food Insecurity (07/04/2023)  Housing: Low Risk  (07/04/2023)  Transportation Needs: No Transportation Needs (07/04/2023)  Utilities: Not At Risk (07/04/2023)  Alcohol Screen: Low Risk  (11/15/2022)  Depression (PHQ2-9): Low Risk  (06/14/2023)  Financial Resource Strain: Low Risk  (12/28/2022)  Physical Activity: Sufficiently Active (11/15/2022)  Social Connections: Moderately Integrated (11/15/2022)  Stress: No Stress Concern Present (12/28/2022)  Tobacco Use: Low Risk  (07/03/2023)   SDOH Interventions:     Readmission Risk Interventions    07/04/2023    8:27 AM 06/15/2023   12:46 PM 06/03/2023   11:29 AM  Readmission Risk Prevention Plan  Transportation Screening Complete Complete Complete  HRI or Home Care Consult   Complete  Social Work Consult for Recovery Care Planning/Counseling   Complete  Palliative Care Screening   Not Applicable  Medication Review Oceanographer) Complete Complete Complete  PCP or Specialist appointment within 3-5 days of discharge  Not Complete   HRI or Home Care Consult Complete Complete   SW Recovery Care/Counseling Consult Complete Complete   Palliative Care Screening Not Applicable Not Applicable  Skilled Nursing Facility Not Applicable Not Applicable

## 2023-07-05 DIAGNOSIS — J189 Pneumonia, unspecified organism: Secondary | ICD-10-CM | POA: Diagnosis not present

## 2023-07-05 LAB — BASIC METABOLIC PANEL
Anion gap: 10 (ref 5–15)
BUN: 57 mg/dL — ABNORMAL HIGH (ref 8–23)
CO2: 27 mmol/L (ref 22–32)
Calcium: 8.2 mg/dL — ABNORMAL LOW (ref 8.9–10.3)
Chloride: 92 mmol/L — ABNORMAL LOW (ref 98–111)
Creatinine, Ser: 2.65 mg/dL — ABNORMAL HIGH (ref 0.44–1.00)
GFR, Estimated: 17 mL/min — ABNORMAL LOW (ref >60)
Glucose, Bld: 259 mg/dL — ABNORMAL HIGH (ref 70–99)
Potassium: 3.5 mmol/L (ref 3.5–5.1)
Sodium: 129 mmol/L — ABNORMAL LOW (ref 135–145)

## 2023-07-05 LAB — CBC
HCT: 26.3 % — ABNORMAL LOW (ref 36.0–46.0)
Hemoglobin: 8.8 g/dL — ABNORMAL LOW (ref 12.0–15.0)
MCH: 31.1 pg (ref 26.0–34.0)
MCHC: 33.5 g/dL (ref 30.0–36.0)
MCV: 92.9 fL (ref 80.0–100.0)
Platelets: 276 10*3/uL (ref 150–400)
RBC: 2.83 MIL/uL — ABNORMAL LOW (ref 3.87–5.11)
RDW: 12.7 % (ref 11.5–15.5)
WBC: 6.4 10*3/uL (ref 4.0–10.5)
nRBC: 0 % (ref 0.0–0.2)

## 2023-07-05 LAB — GLUCOSE, CAPILLARY
Glucose-Capillary: 253 mg/dL — ABNORMAL HIGH (ref 70–99)
Glucose-Capillary: 293 mg/dL — ABNORMAL HIGH (ref 70–99)
Glucose-Capillary: 139 mg/dL — ABNORMAL HIGH (ref 70–99)
Glucose-Capillary: 22 mg/dL — CL (ref 70–99)
Glucose-Capillary: 98 mg/dL (ref 70–99)

## 2023-07-05 MED ORDER — SODIUM CHLORIDE 0.9 % IV SOLN: 2.0000 g | INTRAVENOUS | Status: DC

## 2023-07-05 MED ORDER — DOXYCYCLINE HYCLATE 100 MG PO TABS
100.0000 mg | ORAL_TABLET | Freq: Two times a day (BID) | ORAL | Status: DC
  Administered 2023-07-05 – 2023-07-06 (×2): 100 mg via ORAL
  Filled 2023-07-05 (×2): qty 1

## 2023-07-05 MED ORDER — FUROSEMIDE 10 MG/ML IJ SOLN
40.0000 mg | Freq: Two times a day (BID) | INTRAMUSCULAR | Status: DC
  Administered 2023-07-06: 40 mg via INTRAVENOUS
  Filled 2023-07-05: qty 4

## 2023-07-05 NOTE — Progress Notes (Signed)
PROGRESS NOTE    Emily Jennings  ZOX:[id number] DOB: 1934/09/14 DOA: 07/03/2023 PCP: [name], MD  89/F with diastolic CHF, CKD 4, type 2 diabetes mellitus, hypertension, dyslipidemia was recently hospitalized at [facility] with volume overload, CHF exacerbation, improved with diuretics, subsequently discharged home.  Patient reports marginal improvement in symptoms then, had worsening dyspnea for the last 3 days, presented back to [facility], ED 10/15, some symptoms of dysphagia and cough,  history of orthopnea and some lower extremity edema as well In the ED WBC 5.9, hemoglobin 10.9, troponin 11, BNP 630, creatinine 2.9, chest x-ray with worsening left lower lobe infiltration,?  Bronchopneumonia, small pleural effusions  Subjective: Dyspnea persist- -cough is improving  Assessment and Plan: 1)Acute hypoxic respiratory failure  due to CHF +- aspiration pneumonia -Suspect this is combination of fluid overload and Pneumonia   CT chest shows left lingular opacity is noted concerning for atelectasis or Pneumonia, as well as 13 x 11 mm irregular density is noted in left perihilar region which may represent focal inflammation or pneumonia, but neoplasm cannot be excluded. Follow-up chest CT in 2-3 weeks is recommended to ensure stability or resolution of this abnormality. Speech pathology eval noted--- recommends dysphagia 2 (Fine chop);Thin liquid   -Received Vancomycin and Cefepime -Vancomycin discontinued De-escalate Abx to Doxy and Rocephin -Hypoxia resolving with diuresis  2)-1.7 cm Left Thyroid Nodule---Recommend thyroid US as outpt  3)Acute on chronic diastolic CHF -Last echo 8/24 with EF 65%, grade 2 diastolic dysfunction, normal RV -Clinically appears volume overloaded---diuresing well with IV Lasix -Lasix IV adjusted - has some hypoalbuminemia as well which is contributing to third spacing -GDMT limited by CKD 4, continue beta-blocker  4)Hyponatremia -Likely from volume  overloaded state,  -Sodium trending up with diuresis  5)AKI on CKD 4 -Baseline creatinine around 2-2.4,  --Creatinine trending down from a peak of 2.97 -IV Lasix as above  Uncontrolled type 2 diabetes mellitus --A1c 7.5 reflecting uncontrolled DM with hyperglycemia PTA -c/n semglee  Use Novolog/Humalog Sliding scale insulin with Accu-Cheks/Fingersticks as ordered  . Hyperlipidemia - Continue Crestor  Hypertension Continue nifedipine  GERD (gastroesophageal reflux disease) - PPI  Chronic anemia--no bleeding concerns Hgb close to baseline  DVT prophylaxis: Heparin subcutaneous Code Status: Full code Family Communication: No family at bedside today Disposition Plan: Home likely in am   Objective: Vitals:   07/04/23 2036 07/05/23 0549 07/05/23 1041 07/05/23 1307  BP: (!) 164/73 (!) 153/65 (!) 151/68 (!) 154/62  Pulse: 71 78 73 70  Resp: 20 16 12 17   Temp: 97.9 F (36.6 C) 98.9 F (37.2 C) 98.4 F (36.9 C) 97.9 F (36.6 C)  TempSrc:   Oral   SpO2: 100% 100% 100% 100%  Weight:  50.6 kg    Height:        Intake/Output Summary (Last 24 hours) at 07/05/2023 1748 Last data filed at 07/05/2023 1721 Gross per 24 hour  Intake 960 ml  Output 1901 ml  Net -941 ml   Filed Weights   07/03/23 1522 07/04/23 0400 07/05/23 0549  Weight: 54.4 kg 53.5 kg 50.6 kg   Physical Exam Gen:- Awake Alert, in no acute distress  HEENT:- .AT, No sclera icterus Neck-Supple Neck,No JVD,.  Lungs-improving air movement, no wheezing CV- S1, S2 normal, RRR Abd-  +ve B.Sounds, Abd Soft, No tenderness,    Extremity/Skin:- No  edema,   good pedal pulses  Psych-affect is appropriate, oriented x3 Neuro-generalized weakness, no new focal deficits, no tremors  Data Reviewed:  CBC: Recent Labs  Lab 07/03/23 1557 07/04/23 0045 07/05/23 0506  WBC 5.9 5.1 6.4  NEUTROABS 4.4 3.8  --   HGB 10.9* 9.5* 8.8*  HCT 33.9* 29.0* 26.3*  MCV 95.0 94.8 92.9  PLT 274 256 276   Basic Metabolic  Panel: Recent Labs  Lab 07/03/23 1557 07/04/23 0045 07/05/23 0506  NA 131* 125* 129*  K 4.3 4.3 3.5  CL 92* 89* 92*  CO2 27 24 27   GLUCOSE 64* 227* 259*  BUN 51* 53* 57*  CREATININE 2.95* 2.97* 2.65*  CALCIUM 9.0 8.3* 8.2*  MG  --  2.3  --    GFR: Estimated Creatinine Clearance: 10.7 mL/min (A) (by C-G formula based on SCr of 2.65 mg/dL (H)). Liver Function Tests: Recent Labs  Lab 07/03/23 1557 07/04/23 0045  AST 20 18  ALT 13 15  ALKPHOS 61 57  BILITOT 0.7 1.0  PROT 6.4* 5.6*  ALBUMIN 3.3* 2.9*   CBG: Recent Labs  Lab 07/04/23 1655 07/04/23 2039 07/05/23 0730 07/05/23 1101 07/05/23 1620  GLUCAP 219* 298* 253* 293* 139*   Urine analysis:    Component Value Date/Time   COLORURINE YELLOW 05/20/2023 1535   APPEARANCEUR CLEAR 05/20/2023 1535   LABSPEC 1.013 05/20/2023 1535   PHURINE 5.0 05/20/2023 1535   GLUCOSEU 50 (A) 05/20/2023 1535   HGBUR NEGATIVE 05/20/2023 1535   HGBUR trace-intact 04/28/2010 1259   BILIRUBINUR NEGATIVE 05/20/2023 1535   BILIRUBINUR neg 09/29/2013 1341   KETONESUR NEGATIVE 05/20/2023 1535   PROTEINUR >=300 (A) 05/20/2023 1535   UROBILINOGEN 0.2 09/29/2013 1341   UROBILINOGEN 0.2 12/28/2010 1743   NITRITE NEGATIVE 05/20/2023 1535   LEUKOCYTESUR NEGATIVE 05/20/2023 1535   Recent Results (from the past 240 hour(s))  Resp panel by RT-PCR (RSV, Flu A&B, Covid) Anterior Nasal Swab     Status: None   Collection Time: 07/03/23  8:40 PM   Specimen: Anterior Nasal Swab  Result Value Ref Range Status   SARS Coronavirus 2 by RT PCR NEGATIVE NEGATIVE Final    Comment: (NOTE) SARS-CoV-2 target nucleic acids are NOT DETECTED.  The SARS-CoV-2 RNA is generally detectable in upper respiratory specimens during the acute phase of infection. The lowest concentration of SARS-CoV-2 viral copies this assay can detect is 138 copies/mL. A negative result does not preclude SARS-Cov-2 infection and should not be used as the sole basis for treatment  or other patient management decisions. A negative result may occur with  improper specimen collection/handling, submission of specimen other than nasopharyngeal swab, presence of viral mutation(s) within the areas targeted by this assay, and inadequate number of viral copies(<138 copies/mL). A negative result must be combined with clinical observations, patient history, and epidemiological information. The expected result is Negative.  Fact Sheet for Patients:  BloggerCourse.com  Fact Sheet for Healthcare Providers:  SeriousBroker.it  This test is no t yet approved or cleared by the [place] FDA and  has been authorized for detection and/or diagnosis of SARS-CoV-2 by FDA under an Emergency Use Authorization (EUA). This EUA will remain  in effect (meaning this test can be used) for the duration of the COVID-19 declaration under Section 564(b)(1) of the Act, 21 U.S.C.section 360bbb-3(b)(1), unless the authorization is terminated  or revoked sooner.       Influenza A by PCR NEGATIVE NEGATIVE Final   Influenza B by PCR NEGATIVE NEGATIVE Final    Comment: (NOTE) The Xpert Xpress SARS-CoV-2/FLU/RSV plus assay is intended as an aid in the diagnosis of influenza from Nasopharyngeal  swab specimens and should not be used as a sole basis for treatment. Nasal washings and aspirates are unacceptable for Xpert Xpress SARS-CoV-2/FLU/RSV testing.  Fact Sheet for Patients: BloggerCourse.com  Fact Sheet for Healthcare Providers: SeriousBroker.it  This test is not yet approved or cleared by the [place] FDA and has been authorized for detection and/or diagnosis of SARS-CoV-2 by FDA under an Emergency Use Authorization (EUA). This EUA will remain in effect (meaning this test can be used) for the duration of the COVID-19 declaration under Section 564(b)(1) of the Act, 21 U.S.C. section  360bbb-3(b)(1), unless the authorization is terminated or revoked.     Resp Syncytial Virus by PCR NEGATIVE NEGATIVE Final    Comment: (NOTE) Fact Sheet for Patients: BloggerCourse.com  Fact Sheet for Healthcare Providers: SeriousBroker.it  This test is not yet approved or cleared by the [place] FDA and has been authorized for detection and/or diagnosis of SARS-CoV-2 by FDA under an Emergency Use Authorization (EUA). This EUA will remain in effect (meaning this test can be used) for the duration of the COVID-19 declaration under Section 564(b)(1) of the Act, 21 U.S.C. section 360bbb-3(b)(1), unless the authorization is terminated or revoked.  Performed at [facility], [address], [place], [place] [address]   Culture, blood (routine x 2) Call MD if unable to obtain prior to antibiotics being given     Status: None (Preliminary result)   Collection Time: 07/04/23 12:45 AM   Specimen: BLOOD  Result Value Ref Range Status   Specimen Description BLOOD BLOOD RIGHT FOREARM  Final   Special Requests   Final    BOTTLES DRAWN AEROBIC AND ANAEROBIC Blood Culture results may not be optimal due to an excessive volume of blood received in culture bottles   Culture   Final    NO GROWTH 1 DAY Performed at [facility], [address] [place], [place] [address]    Report Status PENDING  Incomplete  Culture, blood (routine x 2) Call MD if unable to obtain prior to antibiotics being given     Status: None (Preliminary result)   Collection Time: 07/04/23 12:45 AM   Specimen: BLOOD  Result Value Ref Range Status   Specimen Description BLOOD BLOOD RIGHT HAND  Final   Special Requests   Final    BOTTLES DRAWN AEROBIC AND ANAEROBIC Blood Culture results may not be optimal due to an excessive volume of blood received in culture bottles   Culture   Final    NO GROWTH 1 DAY Performed at [facility], [address], [place], [place]  [address]    Report Status PENDING  Incomplete  MRSA Next Gen by PCR, Nasal     Status: None   Collection Time: 07/04/23 11:15 AM   Specimen: Nasal Mucosa; Nasal Swab  Result Value Ref Range Status   MRSA by PCR Next Gen NOT DETECTED NOT DETECTED Final    Comment: (NOTE) The GeneXpert MRSA Assay (FDA approved for NASAL specimens only), is one component of a comprehensive MRSA colonization surveillance program. It is not intended to diagnose MRSA infection nor to guide or monitor treatment for MRSA infections. Test performance is not FDA approved in patients less than 43 years old. Performed at [facility], [address], [place], [place] [address]     Radiology Studies: CT CHEST WO CONTRAST  Result Date: 07/04/2023 CLINICAL DATA:  Chronic dyspnea. EXAM: CT CHEST WITHOUT CONTRAST TECHNIQUE: Multidetector CT imaging of the chest was performed following the standard protocol without  IV contrast. RADIATION DOSE REDUCTION: This exam was performed according to the departmental dose-optimization program which includes automated exposure control, adjustment of the mA and/or kV according to patient size and/or use of iterative reconstruction technique. COMPARISON:  July 03, 2023. FINDINGS: Cardiovascular: Atherosclerosis of thoracic aorta is noted without aneurysm formation. Mild cardiomegaly. No pericardial effusion. Coronary artery calcifications are noted. Mediastinum/Nodes: 1.7 cm left thyroid nodule is noted. Esophagus is unremarkable. Evaluation of mediastinum is limited due to lack of intravenous contrast, and therefore adenopathy cannot be excluded. Lungs/Pleura: No pneumothorax is noted. Minimal left pleural effusion is noted. Right lung is clear. Mild left lingular atelectasis or pneumonia is noted. 13 x 11 mm irregular density is seen in left perihilar region. Upper Abdomen: No acute abnormality. Musculoskeletal: No chest wall mass or suspicious bone lesions identified. IMPRESSION: Minimal  left pleural effusion is noted. Left lingular opacity is noted concerning for atelectasis or pneumonia. 13 x 11 mm irregular density is noted in left perihilar region which may represent focal inflammation or pneumonia, but neoplasm cannot be excluded. Follow-up chest CT in 2-3 weeks is recommended to ensure stability or resolution of this abnormality. 1.7 cm left thyroid nodule. Recommend thyroid US. (Ref: J Am Coll Radiol. 2015 Feb;12(2): 143-50). Coronary artery calcifications are noted suggesting coronary artery disease. Aortic Atherosclerosis (ICD10-I70.0). Electronically Signed   By: Lupita Raider M.D.   On: 07/04/2023 15:00    Scheduled Meds:  carvedilol  25 mg Oral BID WC   feeding supplement  237 mL Oral BID BM   [START ON 07/06/2023] furosemide  40 mg Intravenous BID   heparin  5,000 Units Subcutaneous Q8H   hydrALAZINE  100 mg Oral TID   insulin aspart  0-5 Units Subcutaneous QHS   insulin aspart  0-9 Units Subcutaneous TID WC   insulin glargine-yfgn  15 Units Subcutaneous Daily   NIFEdipine  60 mg Oral Daily   pantoprazole (PROTONIX) IV  40 mg Intravenous Q12H   rosuvastatin  5 mg Oral Daily   Continuous Infusions:  ceFEPime (MAXIPIME) IV 1 g (07/04/23 2115)     LOS: 2 days   Shon Hale, MD  [facility]   07/05/2023, 5:48 PM

## 2023-07-05 NOTE — Inpatient Diabetes Management (Signed)
Inpatient Diabetes Program Recommendations  AACE/ADA: New Consensus Statement on Inpatient Glycemic Control (2015)  Target Ranges:  Prepandial:   less than 140 mg/dL      Peak postprandial:   less than 180 mg/dL (1-2 hours)      Critically ill patients:  140 - 180 mg/dL   Lab Results  Component Value Date   GLUCAP 253 (H) 07/05/2023   HGBA1C 7.5 (A) 05/01/2023    Latest Reference Range & Units 07/04/23 07:25 07/04/23 11:15 07/04/23 16:55 07/04/23 20:39 07/05/23 07:30  Glucose-Capillary 70 - 99 mg/dL 098 (H) 119 (H) 147 (H) 298 (H) 253 (H)  (H): Data is abnormally high  Diabetes history: DM 2 Outpatient Diabetes medications:  Tresiba 5 units daily Humalog 5 units tid with meals (only if CBG>100 mg/dL) Current orders for Inpatient glycemic control:  Novolog sensitive tid with meals and HS Semglee 15 units daily  Inpatient Diabetes Program Recommendations:   Postprandial CBGs elevated. Please consider: -Novolog 2 units tid meal coverage if eats 50% meals  Thank you, Billy Fischer. Karlea Mckibbin, RN, MSN, CDE  Diabetes Coordinator Inpatient Glycemic Control Team Team Pager [phone] (8am-5pm) 07/05/2023 10:00 AM

## 2023-07-06 DIAGNOSIS — J189 Pneumonia, unspecified organism: Secondary | ICD-10-CM | POA: Diagnosis not present

## 2023-07-06 LAB — RENAL FUNCTION PANEL
Albumin: 2.6 g/dL — ABNORMAL LOW (ref 3.5–5.0)
Anion gap: 13 (ref 5–15)
BUN: 56 mg/dL — ABNORMAL HIGH (ref 8–23)
CO2: 27 mmol/L (ref 22–32)
Calcium: 7.9 mg/dL — ABNORMAL LOW (ref 8.9–10.3)
Chloride: 90 mmol/L — ABNORMAL LOW (ref 98–111)
Creatinine, Ser: 2.64 mg/dL — ABNORMAL HIGH (ref 0.44–1.00)
GFR, Estimated: 17 mL/min — ABNORMAL LOW (ref >60)
Glucose, Bld: 312 mg/dL — ABNORMAL HIGH (ref 70–99)
Phosphorus: 3 mg/dL (ref 2.5–4.6)
Potassium: 3.4 mmol/L — ABNORMAL LOW (ref 3.5–5.1)
Sodium: 130 mmol/L — ABNORMAL LOW (ref 135–145)

## 2023-07-06 LAB — GLUCOSE, CAPILLARY
Glucose-Capillary: 410 mg/dL — ABNORMAL HIGH (ref 70–99)
Glucose-Capillary: 456 mg/dL — ABNORMAL HIGH (ref 70–99)
Glucose-Capillary: 534 mg/dL (ref 70–99)
Glucose-Capillary: 431 mg/dL — ABNORMAL HIGH (ref 70–99)
Glucose-Capillary: 353 mg/dL — ABNORMAL HIGH (ref 70–99)

## 2023-07-06 LAB — STREP PNEUMONIAE URINARY ANTIGEN: Strep Pneumo Urinary Antigen: NEGATIVE

## 2023-07-06 MED ORDER — NIFEDIPINE ER 60 MG PO TB24: 60.0000 mg | ORAL_TABLET | Freq: Every day | ORAL | 2 refills | Status: DC

## 2023-07-06 MED ORDER — POTASSIUM CHLORIDE CRYS ER 20 MEQ PO TBCR
40.0000 meq | EXTENDED_RELEASE_TABLET | Freq: Once | ORAL | Status: AC
  Administered 2023-07-06: 40 meq via ORAL
  Filled 2023-07-06: qty 2

## 2023-07-06 MED ORDER — INSULIN ASPART 100 UNIT/ML IJ SOLN
22.0000 [IU] | Freq: Once | INTRAMUSCULAR | Status: AC
  Administered 2023-07-06: 22 [IU] via SUBCUTANEOUS

## 2023-07-06 MED ORDER — PANTOPRAZOLE SODIUM 40 MG PO TBEC
40.0000 mg | DELAYED_RELEASE_TABLET | Freq: Every day | ORAL | 3 refills | Status: DC
Start: 2023-07-06 — End: 2023-10-24

## 2023-07-06 MED ORDER — INSULIN GLARGINE-YFGN 100 UNIT/ML ~~LOC~~ SOLN
20.0000 [IU] | Freq: Every day | SUBCUTANEOUS | Status: DC
  Filled 2023-07-06 (×2): qty 0.2

## 2023-07-06 MED ORDER — INSULIN ASPART 100 UNIT/ML IJ SOLN
0.0000 [IU] | Freq: Three times a day (TID) | INTRAMUSCULAR | Status: DC
  Administered 2023-07-06: 15 [IU] via SUBCUTANEOUS

## 2023-07-06 MED ORDER — POTASSIUM CHLORIDE ER 10 MEQ PO TBCR: 10.0000 meq | EXTENDED_RELEASE_TABLET | Freq: Every day | ORAL | 2 refills | Status: DC

## 2023-07-06 MED ORDER — INSULIN GLARGINE-YFGN 100 UNIT/ML ~~LOC~~ SOLN
15.0000 [IU] | Freq: Every day | SUBCUTANEOUS | Status: DC
  Administered 2023-07-06: 15 [IU] via SUBCUTANEOUS
  Filled 2023-07-06 (×2): qty 0.15

## 2023-07-06 MED ORDER — INSULIN ASPART 100 UNIT/ML IJ SOLN
3.0000 [IU] | Freq: Three times a day (TID) | INTRAMUSCULAR | Status: DC
  Administered 2023-07-06: 3 [IU] via SUBCUTANEOUS

## 2023-07-06 MED ORDER — SPIRONOLACTONE 50 MG PO TABS: 50.0000 mg | ORAL_TABLET | Freq: Every day | ORAL | 5 refills | Status: DC

## 2023-07-06 MED ORDER — ACETAMINOPHEN 325 MG PO TABS: 650.0000 mg | ORAL_TABLET | Freq: Four times a day (QID) | ORAL | Status: DC | PRN

## 2023-07-06 MED ORDER — SODIUM CHLORIDE 0.9 % IV SOLN
2.0000 g | INTRAVENOUS | Status: DC
  Administered 2023-07-06: 2 g via INTRAVENOUS
  Filled 2023-07-06: qty 20

## 2023-07-06 MED ORDER — ALBUTEROL SULFATE HFA 108 (90 BASE) MCG/ACT IN AERS: 2.0000 | INHALATION_SPRAY | RESPIRATORY_TRACT | 1 refills | Status: DC | PRN

## 2023-07-06 MED ORDER — HYDRALAZINE HCL 100 MG PO TABS: 100.0000 mg | ORAL_TABLET | Freq: Three times a day (TID) | ORAL | 1 refills | Status: DC

## 2023-07-06 MED ORDER — DOXYCYCLINE HYCLATE 100 MG PO TABS
100.0000 mg | ORAL_TABLET | Freq: Two times a day (BID) | ORAL | 0 refills | Status: AC
Start: 2023-07-06 — End: 2023-07-11

## 2023-07-06 MED ORDER — FUROSEMIDE 40 MG PO TABS
40.0000 mg | ORAL_TABLET | Freq: Two times a day (BID) | ORAL | 2 refills | Status: DC
Start: 2023-07-06 — End: 2023-07-25

## 2023-07-06 MED ORDER — INSULIN ASPART 100 UNIT/ML IJ SOLN: 0.0000 [IU] | Freq: Every day | INTRAMUSCULAR | Status: DC

## 2023-07-06 MED ORDER — CEPHALEXIN 500 MG PO CAPS: 500.0000 mg | ORAL_CAPSULE | Freq: Three times a day (TID) | ORAL | 0 refills | Status: AC

## 2023-07-06 NOTE — TOC Progression Note (Signed)
Transition of Care [facility]) - Progression Note    Patient Details  Name: Emily Jennings MRN: [id number] Date of Birth: [birth year]  Transition of Care [facility]) CM/SW Contact  Jdyn Parkerson A Elea Holtzclaw, RN Phone Number: 07/06/2023, 11:16 AM  Clinical Narrative:    PT recommending HHPT but unsure if she has help at home. Spoke with patient and she assured CM that her husband and son do not leave her alone at home. Patient is agreeable to HHPT. Notified MD of PT recommendations.   Expected Discharge Plan: Home/Self Care Barriers to Discharge: Continued Medical Work up  Expected Discharge Plan and Services In-house Referral: Clinical Social Work     Living arrangements for the past 2 months: Single Family Home                                       Social Determinants of Health (SDOH) Interventions SDOH Screenings   Food Insecurity: No Food Insecurity (07/04/2023)  Housing: Low Risk  (07/04/2023)  Transportation Needs: No Transportation Needs (07/04/2023)  Utilities: Not At Risk (07/04/2023)  Alcohol Screen: Low Risk  (11/15/2022)  Depression (PHQ2-9): Low Risk  (06/14/2023)  Financial Resource Strain: Low Risk  (12/28/2022)  Physical Activity: Sufficiently Active (11/15/2022)  Social Connections: Moderately Integrated (11/15/2022)  Stress: No Stress Concern Present (12/28/2022)  Tobacco Use: Low Risk  (07/03/2023)    Readmission Risk Interventions    07/04/2023    8:27 AM 06/15/2023   12:46 PM 06/03/2023   11:29 AM  Readmission Risk Prevention Plan  Transportation Screening Complete Complete Complete  HRI or Home Care Consult   Complete  Social Work Consult for Recovery Care Planning/Counseling   Complete  Palliative Care Screening   Not Applicable  Medication Review Oceanographer) Complete Complete Complete  PCP or Specialist appointment within 3-5 days of discharge  Not Complete   HRI or Home Care Consult Complete Complete   SW Recovery Care/Counseling Consult Complete  Complete   Palliative Care Screening Not Applicable Not Applicable   Skilled Nursing Facility Not Applicable Not Applicable

## 2023-07-06 NOTE — Consult Note (Signed)
Value-Based Care Institute  [facility] Inpatient Consult   07/06/2023  MATTHEW DAGGETT 12/12/1933 [id number]  Value-Based Care Institute [facility] [[facility]]  Accountable Care Organization [ACO] Patient: [place] [facility] Medicare  Primary Care Provider:  [name], MD with [facility] is listed  Patient is currently active with [facility] [[facility]] Care Management for care coordination services.  Patient has been engaged by a [employer] .  The community based plan of care has focused on disease management and community resource support.    Patient will receive a post hospital call and will be evaluated for assessments and disease process education.    Plan: Patient home with [facility], will update Community RN Care Coordinator, with noted appointment 07/12/23   Of note, [facility] Care Management services does not replace or interfere with any services that are needed or arranged by inpatient [facility] care management team.   Charlesetta Shanks, RN, BSN, CCM [place]  [facility], [facility] [place] [facility] Liaison Direct Dial: [phone] or secure chat Website: [email or web address]

## 2023-07-06 NOTE — Plan of Care (Signed)
  Problem: Education: Goal: Knowledge of disease or condition will improve Outcome: Progressing   Problem: Intracerebral Hemorrhage Tissue Perfusion: Goal: Complications of Intracerebral Hemorrhage will be minimized Outcome: Progressing   Problem: Coping: Goal: Will identify appropriate support needs Outcome: Progressing   Problem: Self-Care: Goal: Ability to participate in self-care as condition permits will improve Outcome: Progressing Goal: Ability to communicate needs accurately will improve Outcome: Progressing   Problem: Nutrition: Goal: Risk of aspiration will decrease Outcome: Progressing   Problem: Coping: Goal: Ability to adjust to condition or change in health will improve Outcome: Progressing   Problem: Metabolic: Goal: Ability to maintain appropriate glucose levels will improve Outcome: Progressing

## 2023-07-06 NOTE — Inpatient Diabetes Management (Addendum)
Inpatient Diabetes Program Recommendations  AACE/ADA: New Consensus Statement on Inpatient Glycemic Control (2015)  Target Ranges:  Prepandial:   less than 140 mg/dL      Peak postprandial:   less than 180 mg/dL (1-2 hours)      Critically ill patients:  140 - 180 mg/dL   Lab Results  Component Value Date   GLUCAP 410 (H) 07/06/2023   HGBA1C 7.5 (A) 05/01/2023    Review of Glycemic Control  Latest Reference Range & Units 07/05/23 11:01 07/05/23 16:20 07/05/23 20:51 07/05/23 21:25 07/06/23 07:38  Glucose-Capillary 70 - 99 mg/dL 536 (H) 644 (H) 22 (LL) 98 410 (H)  (LL): Data is critically low (H): Data is abnormally high Diabetes history: DM 2 Outpatient Diabetes medications:  Tresiba 5 units daily Humalog 5 units tid with meals (only if CBG>100 mg/dL) Current orders for Inpatient glycemic control:  Novolog moderate tid with meals and HS Semglee 20 units daily Novolog 3 units TID Novolog 0-15 units TID & HS Novolog 22 units x 1   Inpatient Diabetes Program Recommendations:     Noted hypoglycemia of 22 mg/dL following Novolog 1 units x 1. No interventions documented.  Also, noted new orders increasing insulin and a one time dose of Novolog 22 units. Question if in error?  Discussed with nursing staff this AM concern for administration of Novolog 22 units x 1 and encouraged questioning dose with risk for severe hypoglycemia.   Instead consider: -Novolog 0-6 units TID & HS -Discontinuing Novolog 22 units x 1 -Decreasing meal coverage to Novolog 2 units TID  @0930 - Secure chat sent to MD. Await new orders. No new orders received.  @0952 - Secure chat sent to [place] to verify orders and attached MD to note. No additional orders.   Thanks, Lujean Rave, MSN, RNC-OB Diabetes Coordinator [phone] (8a-5p)

## 2023-07-06 NOTE — Care Management Important Message (Signed)
Important Message  Patient Details  Name: Emily Jennings MRN: [id number] Date of Birth: Jun 20, 1934   Important Message Given:  Yes - Medicare IM     Corey Harold 07/06/2023, 11:27 AM

## 2023-07-06 NOTE — Discharge Instructions (Signed)
1)Very low-salt diet advised--less than 2 g of sodium per day advised 2)Weigh yourself daily, call if you gain more than 3 pounds in 1 day or more than 5 pounds in 1 week as your diuretic medications may need to be adjusted 3)Limit your Fluid  intake to no more than 60 ounces (1.8 Liters) per day 4)Repeat BMP blood test within a week--your Farxiga/Dapagliflozin medication may have to be discontinued depending on your kidney function when your BMP blood test is rechecked next week 5)Avoid ibuprofen/Advil/Aleve/Motrin/Goody Powders/Naproxen/BC powders/Meloxicam/Diclofenac/Indomethacin and other Nonsteroidal anti-inflammatory medications as these will make you more likely to bleed and can cause stomach ulcers, can also cause Kidney problems.  7) repeat CBC blood test within a week--may benefit from Epogen/Procrit type injections if your blood count remains low --- your primary care physician can help you arrange this 8)1.7 cm Left Thyroid Nodule---Recommend thyroid ultrasound as an outpatient--- primary care physician can help to arrange this

## 2023-07-06 NOTE — Progress Notes (Signed)
CBG- 410 MD notified new orders placed

## 2023-07-06 NOTE — Progress Notes (Signed)
Nsg Discharge Note  Admit Date:  07/03/2023 Discharge date: 07/06/2023   Emily Jennings to be D/C'd Home per MD order.  AVS completed.  Copy for chart, and copy for patient signed, and dated. Patient/caregiver able to verbalize understanding.  Discharge Medication: Allergies as of 07/06/2023       Reactions   Amlodipine Swelling   Benadryl [diphenhydramine Hcl] Hypertension   Citalopram Other (See Comments)   unknown   Farxiga [dapagliflozin] Other (See Comments)   DKA   Metformin And Related Diarrhea   Lost appetite and weight    Tramadol Other (See Comments)   Felt light headed and dizzy   Crestor [rosuvastatin] Other (See Comments)   "Feet swelling", makes her feel weak. Pt takes 5mg  at home        Medication List     STOP taking these medications    hydrochlorothiazide 12.5 MG tablet Commonly known as: HYDRODIURIL   potassium chloride 10 MEQ tablet Commonly known as: KLOR-CON M       TAKE these medications    acetaminophen 325 MG tablet Commonly known as: TYLENOL Take 2 tablets (650 mg total) by mouth every 6 (six) hours as needed for mild pain (pain score 1-3) (or Fever >/= 101).   albuterol 108 (90 Base) MCG/ACT inhaler Commonly known as: VENTOLIN HFA Inhale 2 puffs into the lungs every 2 (two) hours as needed for wheezing or shortness of breath.   calcitRIOL 0.25 MCG capsule Commonly known as: ROCALTROL Take 0.25 mcg by mouth 3 (three) times a week.   carvedilol 25 MG tablet Commonly known as: COREG Take 1 tablet (25 mg total) by mouth 2 (two) times daily with a meal.   cephALEXin 500 MG capsule Commonly known as: Keflex Take 1 capsule (500 mg total) by mouth 3 (three) times daily for 5 days.   doxycycline 100 MG tablet Commonly known as: VIBRA-TABS Take 1 tablet (100 mg total) by mouth 2 (two) times daily for 5 days.   Farxiga 10 MG Tabs tablet Generic drug: dapagliflozin propanediol Take 10 mg by mouth every morning.   furosemide 40 MG  tablet Commonly known as: Lasix Take 1 tablet (40 mg total) by mouth 2 (two) times daily. What changed: when to take this   hydrALAZINE 100 MG tablet Commonly known as: APRESOLINE Take 1 tablet (100 mg total) by mouth 3 (three) times daily.   insulin lispro 100 UNIT/ML KwikPen Commonly known as: HumaLOG KwikPen Inject 5 Units into the skin 3 (three) times daily. Max daily 30 units   NIFEdipine 60 MG 24 hr tablet Commonly known as: ADALAT CC Take 1 tablet (60 mg total) by mouth daily.   pantoprazole 40 MG tablet Commonly known as: Protonix Take 1 tablet (40 mg total) by mouth daily.   potassium chloride 10 MEQ tablet Commonly known as: KLOR-CON Take 1 tablet (10 mEq total) by mouth daily. Take While taking Lasix/furosemide   rosuvastatin 5 MG tablet Commonly known as: CRESTOR TAKE 1 TABLET (5 MG TOTAL) BY MOUTH DAILY.   spironolactone 50 MG tablet Commonly known as: ALDACTONE Take 1 tablet (50 mg total) by mouth daily.   Evaristo Bury FlexTouch 100 UNIT/ML FlexTouch Pen Generic drug: insulin degludec Inject 5 Units into the skin daily.        Discharge Assessment: Vitals:   07/06/23 0535 07/06/23 1357  BP: (!) 170/62 (!) 136/52  Pulse: 93 77  Resp: 20 16  Temp: 98.6 F (37 C)   SpO2: 97% 97%  Skin clean, dry and intact without evidence of skin break down, no evidence of skin tears noted. IV catheter discontinued intact. Site without signs and symptoms of complications - no redness or edema noted at insertion site, patient denies c/o pain - only slight tenderness at site.  Dressing with slight pressure applied.  D/c Instructions-Education: Discharge instructions given to patient/family with verbalized understanding. D/c education completed with patient/family including follow up instructions, medication list, d/c activities limitations if indicated, with other d/c instructions as indicated by MD - patient able to verbalize understanding, all questions fully  answered. Patient instructed to return to ED, call 911, or call MD for any changes in condition.  Patient escorted via WC, and D/C home via private auto.  Demetrio Lapping, LPN 16/06/9603 5:40 PM

## 2023-07-06 NOTE — Evaluation (Signed)
Physical Therapy Evaluation Patient Details Name: Emily Jennings MRN: [id number] DOB: [birth year] Today's Date: 07/06/2023  History of Present Illness  Emily Jennings is a 89 y.o. female with medical history significant of anxiety, diabetes mellitus type 2, GERD, hypertension, murmur, and more presents the ED with a chief complaint of dyspnea.  Patient reports that she has had dyspnea for several days.  She reports it seems to been getting worse actually since she got out of the hospital last time.  She reports compliance with her medication.  She denies any chest pain, abdominal pain.  She denies palpitations.  She has not had any fever.  She does have a cough, which is sometimes productive.  The sputum is clear or white.  Patient's only other complaint is dysphagia.  She reports that since she got on the hospital last she has felt like she cannot swallow properly.  It has affected her appetite.  She cannot really describe it to me though.  She just says that it is hard to swallow.  It is not painful.  Patient has no other complaints at this time.   Clinical Impression  Patient demonstrates good return for sitting up at bedside with [facility] raised, slightly unsteady and fatigues easily ambulating with SPC, required use of RW for longer distances and tolerated sitting up in chair after therapy.  Patient on room air with SpO2 at 98-100%, HR at 96 BPM during ambulation.  Patient tolerated sitting up in chair after therapy. PLAN:  Patient to be discharged home today and discharged from acute physical therapy to care of nursing for ambulation as tolerated for length of stay with recommendations stated below          If plan is discharge home, recommend the following: A little help with walking and/or transfers;A little help with bathing/dressing/bathroom;Help with stairs or ramp for entrance;Assistance with cooking/housework   Can travel by private vehicle        Equipment Recommendations None recommended  by PT  Recommendations for Other Services       Functional Status Assessment Patient has had a recent decline in their functional status and demonstrates the ability to make significant improvements in function in a reasonable and predictable amount of time.     Precautions / Restrictions Precautions Precautions: Fall Restrictions Weight Bearing Restrictions: No      Mobility  Bed Mobility Overal bed mobility: Modified Independent                  Transfers Overall transfer level: Modified independent                      Ambulation/Gait Ambulation/Gait assistance: Supervision, Contact guard assist Gait Distance (Feet): 40 Feet Assistive device: Rolling walker (2 wheels), Straight cane Gait Pattern/deviations: Decreased step length - right, Decreased step length - left, Decreased stride length Gait velocity: decreased     General Gait Details: slow labored cadence with fair carryover for using SPC, but fatigues easily and required the use of a RW for returning to room  Stairs            Wheelchair Mobility     Tilt Bed    Modified Rankin (Stroke Patients Only)       Balance Overall balance assessment: Needs assistance Sitting-balance support: Feet supported, No upper extremity supported Sitting balance-Leahy Scale: Good Sitting balance - Comments: seated at EOB   Standing balance support: During functional activity, Single extremity supported Standing balance-Leahy Scale:  Fair Standing balance comment: fair/good using RW                             Pertinent Vitals/Pain Pain Assessment Pain Assessment: No/denies pain    Home Living Family/patient expects to be discharged to:: Private residence Living Arrangements: Spouse/significant other Available Help at Discharge: Family;Available 24 hours/day Type of Home: House Home Access: Stairs to enter Entrance Stairs-Rails: None Entrance Stairs-Number of Steps: 1   Home  Layout: One level Home Equipment: Cane - single point;Shower Counsellor (2 wheels)      Prior Function Prior Level of Function : Independent/Modified Independent             Mobility Comments: household ambulation using SPC ADLs Comments: independent ADls, light IADls and med mgmt (she reports not driving recently)     Extremity/Trunk Assessment   Upper Extremity Assessment Upper Extremity Assessment: Overall WFL for tasks assessed    Lower Extremity Assessment Lower Extremity Assessment: Generalized weakness    Cervical / Trunk Assessment Cervical / Trunk Assessment: Kyphotic  Communication   Communication Communication: No apparent difficulties Cueing Techniques: Verbal cues;Tactile cues  Cognition Arousal: Alert Behavior During Therapy: WFL for tasks assessed/performed Overall Cognitive Status: No family/caregiver present to determine baseline cognitive functioning                                          General Comments      Exercises     Assessment/Plan    PT Assessment All further PT needs can be met in the next venue of care  PT Problem List Decreased strength;Decreased activity tolerance;Decreased balance;Decreased mobility       PT Treatment Interventions      PT Goals (Current goals can be found in the Care Plan section)  Acute Rehab PT Goals Patient Stated Goal: return home with family to assist PT Goal Formulation: With patient Time For Goal Achievement: 07/06/23 Potential to Achieve Goals: Good    Frequency       Co-evaluation               AM-PAC PT "6 Clicks" Mobility  Outcome Measure Help needed turning from your back to your side while in a flat bed without using bedrails?: None Help needed moving from lying on your back to sitting on the side of a flat bed without using bedrails?: None Help needed moving to and from a bed to a chair (including a wheelchair)?: None Help needed standing up from a  chair using your arms (e.g., wheelchair or bedside chair)?: A Little Help needed to walk in hospital room?: A Little Help needed climbing 3-5 steps with a railing? : A Little 6 Click Score: 21    End of Session   Activity Tolerance: Patient tolerated treatment well;Patient limited by fatigue Patient left: in chair;with call bell/phone within reach Nurse Communication: Mobility status PT Visit Diagnosis: Unsteadiness on feet (R26.81);Other abnormalities of gait and mobility (R26.89);Muscle weakness (generalized) (M62.81)    Time: 1020-1046 PT Time Calculation (min) (ACUTE ONLY): 26 min   Charges:   PT Evaluation $PT Eval Moderate Complexity: 1 Mod PT Treatments $Therapeutic Activity: 23-37 mins PT General Charges $$ ACUTE PT VISIT: 1 Visit         1:49 PM, 07/06/23 Ocie Bob, MPT Physical Therapist with [facility] 336 [phone]  office [phone] mobile phone

## 2023-07-06 NOTE — TOC Transition Note (Signed)
Transition of Care [facility]) - CM/SW Discharge Note   Patient Details  Name: Emily Jennings MRN: [id number] Date of Birth: 1934/03/08  Transition of Care [facility]) CM/SW Contact:  Kaimana Lurz A Isbella Arline, RN Phone Number: 07/06/2023, 2:33 PM   Clinical Narrative:    Spoke with patient about home health, patient agreeable. No agency preference. Referral given to [facility] with [facility], referral accepted for home health PT. MD placed [facility] orders. TOC signing off.   Final next level of care: Home w Home Health Services Barriers to Discharge: Barriers Resolved   Patient Goals and CMS Choice   Choice offered to / list presented to : Spouse  Discharge Placement                         Discharge Plan and Services Additional resources added to the After Visit Summary for   In-house Referral: Clinical Social Work                        HH Arranged: PT [facility] Agency: [facility] Date HH Agency Contacted: 07/06/23   Representative spoke with at [facility] Agency: Morrie Sheldon  Social Determinants of Health (SDOH) Interventions SDOH Screenings   Food Insecurity: No Food Insecurity (07/04/2023)  Housing: Low Risk  (07/04/2023)  Transportation Needs: No Transportation Needs (07/04/2023)  Utilities: Not At Risk (07/04/2023)  Alcohol Screen: Low Risk  (11/15/2022)  Depression (PHQ2-9): Low Risk  (06/14/2023)  Financial Resource Strain: Low Risk  (12/28/2022)  Physical Activity: Sufficiently Active (11/15/2022)  Social Connections: Moderately Integrated (11/15/2022)  Stress: No Stress Concern Present (12/28/2022)  Tobacco Use: Low Risk  (07/03/2023)     Readmission Risk Interventions    07/04/2023    8:27 AM 06/15/2023   12:46 PM 06/03/2023   11:29 AM  Readmission Risk Prevention Plan  Transportation Screening Complete Complete Complete  HRI or Home Care Consult   Complete  Social Work Consult for Recovery Care Planning/Counseling   Complete  Palliative Care Screening   Not  Applicable  Medication Review [employer]) Complete Complete Complete  PCP or Specialist appointment within 3-5 days of discharge  Not Complete   HRI or Home Care Consult Complete Complete   SW Recovery Care/Counseling Consult Complete Complete   Palliative Care Screening Not Applicable Not Applicable   Skilled Nursing Facility Not Applicable Not Applicable

## 2023-07-06 NOTE — Discharge Summary (Signed)
Emily Jennings, is a 89 y.o. female  DOB 1934/06/22  MRN [id number].  Admission date:  07/03/2023  Admitting Physician  Lilyan Gilford, DO  Discharge Date:  07/06/2023   Primary MD  [name], MD  Recommendations for primary care physician for things to follow:   1)Very low-salt diet advised--less than 2 g of sodium per day advised 2)Weigh yourself daily, call if you gain more than 3 pounds in 1 day or more than 5 pounds in 1 week as your diuretic medications may need to be adjusted 3)Limit your Fluid  intake to no more than 60 ounces (1.8 Liters) per day 4)Repeat BMP blood test within a week--your Farxiga/Dapagliflozin medication may have to be discontinued depending on your kidney function when your BMP blood test is rechecked next week 5)Avoid ibuprofen/Advil/Aleve/Motrin/Goody Powders/Naproxen/BC powders/Meloxicam/Diclofenac/Indomethacin and other Nonsteroidal anti-inflammatory medications as these will make you more likely to bleed and can cause stomach ulcers, can also cause Kidney problems.  7) repeat CBC blood test within a week--may benefit from Epogen/Procrit type injections if your blood count remains low --- your primary care physician can help you arrange this 8)1.7 cm Left Thyroid Nodule---Recommend thyroid ultrasound as an outpatient--- primary care physician can help to arrange this  Admission Diagnosis  SOB (shortness of breath) [R06.02] AKI (acute kidney injury) (HCC) [N17.9] HCAP (healthcare-associated pneumonia) [J18.9]   Discharge Diagnosis  SOB (shortness of breath) [R06.02] AKI (acute kidney injury) (HCC) [N17.9] HCAP (healthcare-associated pneumonia) [J18.9]    Principal Problem:   HCAP (healthcare-associated pneumonia) Active Problems:   GERD (gastroesophageal reflux disease)   CHF (congestive heart failure) (HCC)   Hyperlipidemia   Acute on chronic renal failure  (HCC)   Uncontrolled type 2 diabetes mellitus with hypoglycemia, with long-term current use of insulin (HCC)   Dysphagia     Past Medical History:  Diagnosis Date   Allergy    Anxiety    ANXIETY DISORDER, GENERALIZED 07/15/2007   Qualifier: Diagnosis of  By: Chipper Herb     Arthritis    Cancer [facility]) 2009   breast, carcinoma in situ left   Carcinoma in situ of breast 05/21/2008   Qualifier: Diagnosis of  By: [name] MD, Margaret  Diagnosed in 2009, completed 5 year course of tamoxifen, no evidence of recurrence    Carotid stenosis    11/16/2005  mild plaque formation and stenosis proximal right ECA   Cataract    Complication of anesthesia    Coronary artery disease    cardiac catheterization on 03/20/2006  LAD mid 40% stenosis, left circumflex mild 40% stenosis, RCA mid-vessel 40% to 50% lesion   EF 60%   Diabetes mellitus    GERD (gastroesophageal reflux disease)    Hernia, inguinal    left   Hyperglycemia    Hypertension    Insomnia 11/16/2011   Low blood potassium    Non-insulin dependent type 2 diabetes mellitus (HCC)    Osteoporosis    Shortness of breath    2D Echocardiogram 01/26/2009   EF of  greater than 55%, mild MR, mild TR, normal ventricular function   Thickened endometrium 10/26/2017   Noted by gyne in 2017, missed 6 month follow up, referred in 09/2017   Ventricular tachycardia, non-sustained (HCC)    developed during stress test 02/08/2006, spontaneously aborted, mild reversible apical defect    Past Surgical History:  Procedure Laterality Date   BREAST LUMPECTOMY Left 2009   Left breast 2009   CATARACT EXTRACTION W/PHACO Left 10/28/2014   Procedure: PHACO EMULSION CATARACT EXTRACTION WITH INTRAOCULAR LENS IMPLANT LEFT EYE (IOC);  Surgeon: Chalmers Guest, MD;  Location: [facility] OR;  Service: Ophthalmology;  Laterality: Left;   COLONOSCOPY     cyst removed from left foot     REFRACTIVE SURGERY Left     HPI  from the history and physical done on the day of  admission:    HPI: Emily Jennings is a 89 y.o. female with medical history significant of anxiety, diabetes mellitus type 2, GERD, hypertension, murmur, and more presents the ED with a chief complaint of dyspnea.  Patient reports that she has had dyspnea for several days.  She reports it seems to been getting worse actually since she got out of the hospital last time.  She reports compliance with her medication.  She denies any chest pain, abdominal pain.  She denies palpitations.  She has not had any fever.  She does have a cough, which is sometimes productive.  The sputum is clear or white.  Patient's only other complaint is dysphagia.  She reports that since she got on the hospital last she has felt like she cannot swallow properly.  It has affected her appetite.  She cannot really describe it to me though.  She just says that it is hard to swallow.  It is not painful.  Patient has no other complaints at this time.   Patient does not smoke and does not drink. Review of Systems: As mentioned in the history of present illness. All other systems reviewed and are negative.   Hospital Course:   1)Acute hypoxic respiratory failure  due to CHF +- aspiration pneumonia -Suspect this is combination of fluid overload and Pneumonia   CT chest shows left lingular opacity is noted concerning for atelectasis or Pneumonia, as well as 13 x 11 mm irregular density is noted in left perihilar region which may represent focal inflammation or pneumonia, but neoplasm cannot be excluded. Follow-up chest CT in 2-3 weeks is recommended to ensure stability or resolution of this abnormality. Speech pathology eval noted--- recommends dysphagia 2 (Fine chop);Thin liquid   -Received Vancomycin and Cefepime -Vancomycin discontinued De-escalated Abx to Doxy and Rocephin -Hypoxia resolved -Clinically much improved -Okay to discharge on Keflex and doxycycline as well as Aldactone and Lasix   2)-1.7 cm Left Thyroid  Nodule---Recommend thyroid US as outpt   3)Acute on chronic diastolic CHF -Last echo 8/24 with EF 65%, grade 2 diastolic dysfunction, normal RV - has some hypoalbuminemia as well which is contributing to third spacing -GDMT limited by CKD 4, continue beta-blocker -Improved with IV Lasix -Weaned off oxygen -Okay to discharge on Aldactone and Lasix   4)Hyponatremia -Likely from volume overloaded state,  -Sodium trending up with diuresis--sodium is up to 134 mL of 125 -Patient with history of chronic hyponatremia -Repeat BMP as outpatient as advised   5)AKI on CKD 4 -Baseline creatinine around 2-2.4,  --Treated with-IV Lasix as above -Creatinine is down to 2.64 from a peak of 2.97 -Repeat BMP as outpatient as  advised   Uncontrolled type 2 diabetes mellitus --A1c 7.5 reflecting uncontrolled DM with hyperglycemia PTA -Resume PTA diabetic regimen, follow-up with PCP for adjustments . Hyperlipidemia - Continue Crestor   Hypertension Continue nifedipine, hydralazine, Coreg, Aldactone and Lasix   GERD (gastroesophageal reflux disease) - Protonix   Chronic anemia--in the setting of CKD 4  -no bleeding concerns Hgb close to baseline -Repeat CBC as outpatient  Discharge Condition: Stable  Follow UP   Follow-up Information     [name], MD. Schedule an appointment as soon as possible for a visit in 1 week(s).   Specialty: Family Medicine Why: Repeat CBC and BMP Blood Test in 1 week Contact information: [address], [address] [place] [address] [phone]                 Diet and Activity recommendation:  As advised  Discharge Instructions    Discharge Instructions     Call MD for:  difficulty breathing, headache or visual disturbances   Complete by: As directed    Call MD for:  persistant dizziness or light-headedness   Complete by: As directed    Call MD for:  temperature >100.4   Complete by: As directed    Diet - low sodium heart  healthy   Complete by: As directed    Discharge instructions   Complete by: As directed    1)Very low-salt diet advised--less than 2 g of sodium per day advised 2)Weigh yourself daily, call if you gain more than 3 pounds in 1 day or more than 5 pounds in 1 week as your diuretic medications may need to be adjusted 3)Limit your Fluid  intake to no more than 60 ounces (1.8 Liters) per day 4)Repeat BMP blood test within a week--your Farxiga/Dapagliflozin medication may have to be discontinued depending on your kidney function when your BMP blood test is rechecked next week 5)Avoid ibuprofen/Advil/Aleve/Motrin/Goody Powders/Naproxen/BC powders/Meloxicam/Diclofenac/Indomethacin and other Nonsteroidal anti-inflammatory medications as these will make you more likely to bleed and can cause stomach ulcers, can also cause Kidney problems.  7) repeat CBC blood test within a week--may benefit from Epogen/Procrit type injections if your blood count remains low --- your primary care physician can help you arrange this 8)1.7 cm Left Thyroid Nodule---Recommend thyroid ultrasound as an outpatient--- primary care physician can help to arrange this   Increase activity slowly   Complete by: As directed        Discharge Medications     Allergies as of 07/06/2023       Reactions   Amlodipine Swelling   Benadryl [diphenhydramine Hcl] Hypertension   Citalopram Other (See Comments)   unknown   Farxiga [dapagliflozin] Other (See Comments)   DKA   Metformin And Related Diarrhea   Lost appetite and weight    Tramadol Other (See Comments)   Felt light headed and dizzy   Crestor [rosuvastatin] Other (See Comments)   "Feet swelling", makes her feel weak. Pt takes 5mg  at home        Medication List     STOP taking these medications    hydrochlorothiazide 12.5 MG tablet Commonly known as: HYDRODIURIL   potassium chloride 10 MEQ tablet Commonly known as: KLOR-CON M       TAKE these medications     acetaminophen 325 MG tablet Commonly known as: TYLENOL Take 2 tablets (650 mg total) by mouth every 6 (six) hours as needed for mild pain (pain score 1-3) (or Fever >/= 101).  albuterol 108 (90 Base) MCG/ACT inhaler Commonly known as: VENTOLIN HFA Inhale 2 puffs into the lungs every 2 (two) hours as needed for wheezing or shortness of breath.   calcitRIOL 0.25 MCG capsule Commonly known as: ROCALTROL Take 0.25 mcg by mouth 3 (three) times a week.   carvedilol 25 MG tablet Commonly known as: COREG Take 1 tablet (25 mg total) by mouth 2 (two) times daily with a meal.   cephALEXin 500 MG capsule Commonly known as: Keflex Take 1 capsule (500 mg total) by mouth 3 (three) times daily for 5 days.   doxycycline 100 MG tablet Commonly known as: VIBRA-TABS Take 1 tablet (100 mg total) by mouth 2 (two) times daily for 5 days.   Farxiga 10 MG Tabs tablet Generic drug: dapagliflozin propanediol Take 10 mg by mouth every morning.   furosemide 40 MG tablet Commonly known as: Lasix Take 1 tablet (40 mg total) by mouth 2 (two) times daily. What changed: when to take this   hydrALAZINE 100 MG tablet Commonly known as: APRESOLINE Take 1 tablet (100 mg total) by mouth 3 (three) times daily.   insulin lispro 100 UNIT/ML KwikPen Commonly known as: HumaLOG KwikPen Inject 5 Units into the skin 3 (three) times daily. Max daily 30 units   NIFEdipine 60 MG 24 hr tablet Commonly known as: ADALAT CC Take 1 tablet (60 mg total) by mouth daily.   pantoprazole 40 MG tablet Commonly known as: Protonix Take 1 tablet (40 mg total) by mouth daily.   potassium chloride 10 MEQ tablet Commonly known as: KLOR-CON Take 1 tablet (10 mEq total) by mouth daily. Take While taking Lasix/furosemide   rosuvastatin 5 MG tablet Commonly known as: CRESTOR TAKE 1 TABLET (5 MG TOTAL) BY MOUTH DAILY.   spironolactone 50 MG tablet Commonly known as: ALDACTONE Take 1 tablet (50 mg total) by mouth daily.    Evaristo Bury FlexTouch 100 UNIT/ML FlexTouch Pen Generic drug: insulin degludec Inject 5 Units into the skin daily.       Major procedures and Radiology Reports - PLEASE review detailed and final reports for all details, in brief -   CT CHEST WO CONTRAST  Result Date: 07/04/2023 CLINICAL DATA:  Chronic dyspnea. EXAM: CT CHEST WITHOUT CONTRAST TECHNIQUE: Multidetector CT imaging of the chest was performed following the standard protocol without IV contrast. RADIATION DOSE REDUCTION: This exam was performed according to the departmental dose-optimization program which includes automated exposure control, adjustment of the mA and/or kV according to patient size and/or use of iterative reconstruction technique. COMPARISON:  July 03, 2023. FINDINGS: Cardiovascular: Atherosclerosis of thoracic aorta is noted without aneurysm formation. Mild cardiomegaly. No pericardial effusion. Coronary artery calcifications are noted. Mediastinum/Nodes: 1.7 cm left thyroid nodule is noted. Esophagus is unremarkable. Evaluation of mediastinum is limited due to lack of intravenous contrast, and therefore adenopathy cannot be excluded. Lungs/Pleura: No pneumothorax is noted. Minimal left pleural effusion is noted. Right lung is clear. Mild left lingular atelectasis or pneumonia is noted. 13 x 11 mm irregular density is seen in left perihilar region. Upper Abdomen: No acute abnormality. Musculoskeletal: No chest wall mass or suspicious bone lesions identified. IMPRESSION: Minimal left pleural effusion is noted. Left lingular opacity is noted concerning for atelectasis or pneumonia. 13 x 11 mm irregular density is noted in left perihilar region which may represent focal inflammation or pneumonia, but neoplasm cannot be excluded. Follow-up chest CT in 2-3 weeks is recommended to ensure stability or resolution of this abnormality. 1.7 cm left thyroid  nodule. Recommend thyroid US. (Ref: J Am Coll Radiol. 2015 Feb;12(2): 143-50).  Coronary artery calcifications are noted suggesting coronary artery disease. Aortic Atherosclerosis (ICD10-I70.0). Electronically Signed   By: Lupita Raider M.D.   On: 07/04/2023 15:00   DG Chest 2 View  Result Date: 07/03/2023 CLINICAL DATA:  Shortness of breath.  History of left breast cancer. EXAM: CHEST - 2 VIEW COMPARISON:  06/18/2023 FINDINGS: Heart size is normal. Aortic atherosclerosis as seen previously. Small pleural effusions in both posterior costophrenic angles. Worsening infiltrate in the left lower lobe consistent with bronchopneumonia. No sign of heart failure. No focal bone lesion. IMPRESSION: Worsening left lower lobe infiltrate consistent with bronchopneumonia. Small pleural effusions in both posterior costophrenic angles. Electronically Signed   By: Paulina Fusi M.D.   On: 07/03/2023 18:11   DG CHEST PORT 1 VIEW  Result Date: 06/18/2023 CLINICAL DATA:  Shortness of breath.  Volume overload.  Dyspnea. EXAM: PORTABLE CHEST 1 VIEW COMPARISON:  06/14/2023 FINDINGS: Unchanged cardiomegaly. Stable mediastinal contours. Bilateral pleural effusions have improved from prior exam, particularly on the left. There is vascular congestion. No pneumothorax. IMPRESSION: Cardiomegaly with vascular congestion. Bilateral pleural effusions have improved from prior exam, particularly on the left. Electronically Signed   By: Narda Rutherford M.D.   On: 06/18/2023 10:52   DG Chest 2 View  Result Date: 06/14/2023 CLINICAL DATA:  Dyspnea EXAM: CHEST - 2 VIEW COMPARISON:  06/09/2023 FINDINGS: Small bilateral pleural effusions are present, left greater than right. No confluent pulmonary infiltrate. No pneumothorax. Cardiac size is at the upper limits of normal. Pulmonary vascularity is normal. No acute bone abnormality. IMPRESSION: 1. Small bilateral pleural effusions, left greater than right. Electronically Signed   By: Helyn Numbers M.D.   On: 06/14/2023 18:53   DG Chest Portable 1 View  Result Date:  06/09/2023 CLINICAL DATA:  Shortness of breath EXAM: PORTABLE CHEST 1 VIEW COMPARISON:  06/01/2023 FINDINGS: Small pleural effusions greater on the left and stable. Chronic cardiomegaly and aortic atheromatous calcification. No edema or consolidation. No pneumothorax. Artifact from EKG leads. IMPRESSION: Cardiomegaly and small pleural effusions.  No change from prior. Electronically Signed   By: Tiburcio Pea M.D.   On: 06/09/2023 04:58    Micro Results   Recent Results (from the past 240 hour(s))  Resp panel by RT-PCR (RSV, Flu A&B, Covid) Anterior Nasal Swab     Status: None   Collection Time: 07/03/23  8:40 PM   Specimen: Anterior Nasal Swab  Result Value Ref Range Status   SARS Coronavirus 2 by RT PCR NEGATIVE NEGATIVE Final    Comment: (NOTE) SARS-CoV-2 target nucleic acids are NOT DETECTED.  The SARS-CoV-2 RNA is generally detectable in upper respiratory specimens during the acute phase of infection. The lowest concentration of SARS-CoV-2 viral copies this assay can detect is 138 copies/mL. A negative result does not preclude SARS-Cov-2 infection and should not be used as the sole basis for treatment or other patient management decisions. A negative result may occur with  improper specimen collection/handling, submission of specimen other than nasopharyngeal swab, presence of viral mutation(s) within the areas targeted by this assay, and inadequate number of viral copies(<138 copies/mL). A negative result must be combined with clinical observations, patient history, and epidemiological information. The expected result is Negative.  Fact Sheet for Patients:  BloggerCourse.com  Fact Sheet for Healthcare Providers:  SeriousBroker.it  This test is no t yet approved or cleared by the [place] and  has been authorized for  detection and/or diagnosis of SARS-CoV-2 by FDA under an Emergency Use Authorization (EUA). This EUA  will remain  in effect (meaning this test can be used) for the duration of the COVID-19 declaration under Section 564(b)(1) of the Act, 21 U.S.C.section 360bbb-3(b)(1), unless the authorization is terminated  or revoked sooner.       Influenza A by PCR NEGATIVE NEGATIVE Final   Influenza B by PCR NEGATIVE NEGATIVE Final    Comment: (NOTE) The Xpert Xpress SARS-CoV-2/FLU/RSV plus assay is intended as an aid in the diagnosis of influenza from Nasopharyngeal swab specimens and should not be used as a sole basis for treatment. Nasal washings and aspirates are unacceptable for Xpert Xpress SARS-CoV-2/FLU/RSV testing.  Fact Sheet for Patients: BloggerCourse.com  Fact Sheet for Healthcare Providers: SeriousBroker.it  This test is not yet approved or cleared by the [place] FDA and has been authorized for detection and/or diagnosis of SARS-CoV-2 by FDA under an Emergency Use Authorization (EUA). This EUA will remain in effect (meaning this test can be used) for the duration of the COVID-19 declaration under Section 564(b)(1) of the Act, 21 U.S.C. section 360bbb-3(b)(1), unless the authorization is terminated or revoked.     Resp Syncytial Virus by PCR NEGATIVE NEGATIVE Final    Comment: (NOTE) Fact Sheet for Patients: BloggerCourse.com  Fact Sheet for Healthcare Providers: SeriousBroker.it  This test is not yet approved or cleared by the [place] FDA and has been authorized for detection and/or diagnosis of SARS-CoV-2 by FDA under an Emergency Use Authorization (EUA). This EUA will remain in effect (meaning this test can be used) for the duration of the COVID-19 declaration under Section 564(b)(1) of the Act, 21 U.S.C. section 360bbb-3(b)(1), unless the authorization is terminated or revoked.  Performed at [facility], [address] [place], [place] [address]    Culture, blood (routine x 2) Call MD if unable to obtain prior to antibiotics being given     Status: None (Preliminary result)   Collection Time: 07/04/23 12:45 AM   Specimen: BLOOD  Result Value Ref Range Status   Specimen Description BLOOD BLOOD RIGHT FOREARM  Final   Special Requests   Final    BOTTLES DRAWN AEROBIC AND ANAEROBIC Blood Culture results may not be optimal due to an excessive volume of blood received in culture bottles   Culture   Final    NO GROWTH 2 DAYS Performed at [facility], [address] [place], [place] [address]    Report Status PENDING  Incomplete  Culture, blood (routine x 2) Call MD if unable to obtain prior to antibiotics being given     Status: None (Preliminary result)   Collection Time: 07/04/23 12:45 AM   Specimen: BLOOD  Result Value Ref Range Status   Specimen Description BLOOD BLOOD RIGHT HAND  Final   Special Requests   Final    BOTTLES DRAWN AEROBIC AND ANAEROBIC Blood Culture results may not be optimal due to an excessive volume of blood received in culture bottles   Culture   Final    NO GROWTH 2 DAYS Performed at [facility], [address] [place], [place] [address]    Report Status PENDING  Incomplete  MRSA Next Gen by PCR, Nasal     Status: None   Collection Time: 07/04/23 11:15 AM   Specimen: Nasal Mucosa; Nasal Swab  Result Value Ref Range Status   MRSA by PCR Next Gen NOT DETECTED NOT DETECTED Final    Comment: (NOTE) The GeneXpert MRSA  Assay (FDA approved for NASAL specimens only), is one component of a comprehensive MRSA colonization surveillance program. It is not intended to diagnose MRSA infection nor to guide or monitor treatment for MRSA infections. Test performance is not FDA approved in patients less than 16 years old. Performed at [facility], [address] [place], [place] [address]     Today   Subjective    Emily Jennings today has no new complaints -Voiding well -Hypoxia resolved    Patient has been  seen and examined prior to discharge   Objective   Blood pressure (!) 136/52, pulse 77, temperature 98.6 F (37 C), resp. rate 16, height 5' (1.524 m), weight 49.4 kg, SpO2 97%.   Intake/Output Summary (Last 24 hours) at 07/06/2023 1359 Last data filed at 07/06/2023 0900 Gross per 24 hour  Intake 600 ml  Output 800 ml  Net -200 ml    Exam Gen:- Awake Alert, no acute distress , speaking in complete sentences HEENT:- [place].AT, No sclera icterus Neck-Supple Neck,No JVD,.  Lungs-improved air movement, no wheezing  CV- S1, S2 normal, regular Abd-  +ve B.Sounds, Abd Soft, No tenderness,    Extremity/Skin:- No  edema,   good pulses Psych-affect is appropriate, oriented x3 Neuro-no new focal deficits, no tremors    Data Review   CBC w Diff:  Lab Results  Component Value Date   WBC 6.4 07/05/2023   HGB 8.8 (L) 07/05/2023   HGB 11.3 02/27/2023   HCT 26.3 (L) 07/05/2023   HCT 35.8 02/27/2023   PLT 276 07/05/2023   PLT 301 02/27/2023   LYMPHOPCT 13 07/04/2023   MONOPCT 10 07/04/2023   EOSPCT 1 07/04/2023   BASOPCT 1 07/04/2023    CMP:  Lab Results  Component Value Date   NA 130 (L) 07/06/2023   NA 137 06/06/2023   K 3.4 (L) 07/06/2023   CL 90 (L) 07/06/2023   CO2 27 07/06/2023   BUN 56 (H) 07/06/2023   BUN 47 (H) 06/06/2023   CREATININE 2.64 (H) 07/06/2023   CREATININE 1.70 (H) 02/01/2023   PROT 5.6 (L) 07/04/2023   PROT 6.1 09/05/2022   ALBUMIN 2.6 (L) 07/06/2023   ALBUMIN 3.8 09/05/2022   BILITOT 1.0 07/04/2023   BILITOT 0.4 09/05/2022   ALKPHOS 57 07/04/2023   AST 18 07/04/2023   ALT 15 07/04/2023   Total Discharge time is about 33 minutes  Shon Hale M.D on 07/06/2023 at 1:59 PM  Go to [email or web address] -  for contact info  [facility] - Office  [phone]

## 2023-07-06 NOTE — Progress Notes (Signed)
CBG rechecked 456 waiting for verification to give ordered 22 units of insulin.

## 2023-07-08 LAB — LEGIONELLA PNEUMOPHILA SEROGP 1 UR AG: L. pneumophila Serogp 1 Ur Ag: NEGATIVE

## 2023-07-09 ENCOUNTER — Telehealth: Payer: Self-pay

## 2023-07-09 DIAGNOSIS — M810 Age-related osteoporosis without current pathological fracture: Secondary | ICD-10-CM | POA: Diagnosis not present

## 2023-07-09 DIAGNOSIS — E041 Nontoxic single thyroid nodule: Secondary | ICD-10-CM | POA: Diagnosis not present

## 2023-07-09 DIAGNOSIS — I2510 Atherosclerotic heart disease of native coronary artery without angina pectoris: Secondary | ICD-10-CM | POA: Diagnosis not present

## 2023-07-09 DIAGNOSIS — Z794 Long term (current) use of insulin: Secondary | ICD-10-CM | POA: Diagnosis not present

## 2023-07-09 DIAGNOSIS — E871 Hypo-osmolality and hyponatremia: Secondary | ICD-10-CM | POA: Diagnosis not present

## 2023-07-09 DIAGNOSIS — R1310 Dysphagia, unspecified: Secondary | ICD-10-CM | POA: Diagnosis not present

## 2023-07-09 DIAGNOSIS — N179 Acute kidney failure, unspecified: Secondary | ICD-10-CM | POA: Diagnosis not present

## 2023-07-09 DIAGNOSIS — Z7984 Long term (current) use of oral hypoglycemic drugs: Secondary | ICD-10-CM | POA: Diagnosis not present

## 2023-07-09 DIAGNOSIS — I6521 Occlusion and stenosis of right carotid artery: Secondary | ICD-10-CM | POA: Diagnosis not present

## 2023-07-09 DIAGNOSIS — E8770 Fluid overload, unspecified: Secondary | ICD-10-CM | POA: Diagnosis not present

## 2023-07-09 DIAGNOSIS — M1990 Unspecified osteoarthritis, unspecified site: Secondary | ICD-10-CM | POA: Diagnosis not present

## 2023-07-09 DIAGNOSIS — Z853 Personal history of malignant neoplasm of breast: Secondary | ICD-10-CM | POA: Diagnosis not present

## 2023-07-09 DIAGNOSIS — E1165 Type 2 diabetes mellitus with hyperglycemia: Secondary | ICD-10-CM | POA: Diagnosis not present

## 2023-07-09 DIAGNOSIS — E785 Hyperlipidemia, unspecified: Secondary | ICD-10-CM | POA: Diagnosis not present

## 2023-07-09 DIAGNOSIS — I5033 Acute on chronic diastolic (congestive) heart failure: Secondary | ICD-10-CM | POA: Diagnosis not present

## 2023-07-09 DIAGNOSIS — D631 Anemia in chronic kidney disease: Secondary | ICD-10-CM | POA: Diagnosis not present

## 2023-07-09 DIAGNOSIS — J9601 Acute respiratory failure with hypoxia: Secondary | ICD-10-CM | POA: Diagnosis not present

## 2023-07-09 DIAGNOSIS — N184 Chronic kidney disease, stage 4 (severe): Secondary | ICD-10-CM | POA: Diagnosis not present

## 2023-07-09 DIAGNOSIS — I130 Hypertensive heart and chronic kidney disease with heart failure and stage 1 through stage 4 chronic kidney disease, or unspecified chronic kidney disease: Secondary | ICD-10-CM | POA: Diagnosis not present

## 2023-07-09 DIAGNOSIS — E1122 Type 2 diabetes mellitus with diabetic chronic kidney disease: Secondary | ICD-10-CM | POA: Diagnosis not present

## 2023-07-09 DIAGNOSIS — K219 Gastro-esophageal reflux disease without esophagitis: Secondary | ICD-10-CM | POA: Diagnosis not present

## 2023-07-09 DIAGNOSIS — J690 Pneumonitis due to inhalation of food and vomit: Secondary | ICD-10-CM | POA: Diagnosis not present

## 2023-07-09 LAB — CULTURE, BLOOD (ROUTINE X 2)
Culture: NO GROWTH
Culture: NO GROWTH

## 2023-07-09 NOTE — Transitions of Care (Post Inpatient/ED Visit) (Signed)
07/09/2023  Name: Emily Jennings MRN: [id number] DOB: [birth year]  Today's TOC FU Call Status: Today's TOC FU Call Status:: Unsuccessful Call (1st Attempt) Unsuccessful Call (1st Attempt) Date: 07/09/23  Attempted to reach the patient regarding the most recent Inpatient/ED visit.  Follow Up Plan: Additional outreach attempts will be made to reach the patient to complete the Transitions of Care (Post Inpatient/ED visit) call.   Jodelle Gross RN, BSN, CCM RN Care Manager  Transitions of Care  [facility]  [phone]

## 2023-07-10 DIAGNOSIS — J9601 Acute respiratory failure with hypoxia: Secondary | ICD-10-CM | POA: Diagnosis not present

## 2023-07-10 DIAGNOSIS — Z794 Long term (current) use of insulin: Secondary | ICD-10-CM | POA: Diagnosis not present

## 2023-07-10 DIAGNOSIS — I5033 Acute on chronic diastolic (congestive) heart failure: Secondary | ICD-10-CM | POA: Diagnosis not present

## 2023-07-10 DIAGNOSIS — I2510 Atherosclerotic heart disease of native coronary artery without angina pectoris: Secondary | ICD-10-CM | POA: Diagnosis not present

## 2023-07-10 DIAGNOSIS — K219 Gastro-esophageal reflux disease without esophagitis: Secondary | ICD-10-CM | POA: Diagnosis not present

## 2023-07-10 DIAGNOSIS — Z7984 Long term (current) use of oral hypoglycemic drugs: Secondary | ICD-10-CM | POA: Diagnosis not present

## 2023-07-10 DIAGNOSIS — E1122 Type 2 diabetes mellitus with diabetic chronic kidney disease: Secondary | ICD-10-CM | POA: Diagnosis not present

## 2023-07-10 DIAGNOSIS — I6521 Occlusion and stenosis of right carotid artery: Secondary | ICD-10-CM | POA: Diagnosis not present

## 2023-07-10 DIAGNOSIS — I130 Hypertensive heart and chronic kidney disease with heart failure and stage 1 through stage 4 chronic kidney disease, or unspecified chronic kidney disease: Secondary | ICD-10-CM | POA: Diagnosis not present

## 2023-07-10 DIAGNOSIS — J690 Pneumonitis due to inhalation of food and vomit: Secondary | ICD-10-CM | POA: Diagnosis not present

## 2023-07-10 DIAGNOSIS — M1990 Unspecified osteoarthritis, unspecified site: Secondary | ICD-10-CM | POA: Diagnosis not present

## 2023-07-10 DIAGNOSIS — M810 Age-related osteoporosis without current pathological fracture: Secondary | ICD-10-CM | POA: Diagnosis not present

## 2023-07-10 DIAGNOSIS — E785 Hyperlipidemia, unspecified: Secondary | ICD-10-CM | POA: Diagnosis not present

## 2023-07-10 DIAGNOSIS — E8770 Fluid overload, unspecified: Secondary | ICD-10-CM | POA: Diagnosis not present

## 2023-07-10 DIAGNOSIS — E871 Hypo-osmolality and hyponatremia: Secondary | ICD-10-CM | POA: Diagnosis not present

## 2023-07-10 DIAGNOSIS — E041 Nontoxic single thyroid nodule: Secondary | ICD-10-CM | POA: Diagnosis not present

## 2023-07-10 DIAGNOSIS — N184 Chronic kidney disease, stage 4 (severe): Secondary | ICD-10-CM | POA: Diagnosis not present

## 2023-07-10 DIAGNOSIS — Z853 Personal history of malignant neoplasm of breast: Secondary | ICD-10-CM | POA: Diagnosis not present

## 2023-07-10 DIAGNOSIS — E1165 Type 2 diabetes mellitus with hyperglycemia: Secondary | ICD-10-CM | POA: Diagnosis not present

## 2023-07-10 DIAGNOSIS — D631 Anemia in chronic kidney disease: Secondary | ICD-10-CM | POA: Diagnosis not present

## 2023-07-10 DIAGNOSIS — R1310 Dysphagia, unspecified: Secondary | ICD-10-CM | POA: Diagnosis not present

## 2023-07-10 DIAGNOSIS — N179 Acute kidney failure, unspecified: Secondary | ICD-10-CM | POA: Diagnosis not present

## 2023-07-12 ENCOUNTER — Ambulatory Visit: Payer: Self-pay | Admitting: *Deleted

## 2023-07-12 ENCOUNTER — Telehealth: Payer: Self-pay | Admitting: Family Medicine

## 2023-07-12 DIAGNOSIS — R1310 Dysphagia, unspecified: Secondary | ICD-10-CM | POA: Diagnosis not present

## 2023-07-12 DIAGNOSIS — I2510 Atherosclerotic heart disease of native coronary artery without angina pectoris: Secondary | ICD-10-CM | POA: Diagnosis not present

## 2023-07-12 DIAGNOSIS — J690 Pneumonitis due to inhalation of food and vomit: Secondary | ICD-10-CM | POA: Diagnosis not present

## 2023-07-12 DIAGNOSIS — K219 Gastro-esophageal reflux disease without esophagitis: Secondary | ICD-10-CM | POA: Diagnosis not present

## 2023-07-12 DIAGNOSIS — Z7984 Long term (current) use of oral hypoglycemic drugs: Secondary | ICD-10-CM | POA: Diagnosis not present

## 2023-07-12 DIAGNOSIS — E871 Hypo-osmolality and hyponatremia: Secondary | ICD-10-CM | POA: Diagnosis not present

## 2023-07-12 DIAGNOSIS — E1165 Type 2 diabetes mellitus with hyperglycemia: Secondary | ICD-10-CM | POA: Diagnosis not present

## 2023-07-12 DIAGNOSIS — J9601 Acute respiratory failure with hypoxia: Secondary | ICD-10-CM | POA: Diagnosis not present

## 2023-07-12 DIAGNOSIS — I130 Hypertensive heart and chronic kidney disease with heart failure and stage 1 through stage 4 chronic kidney disease, or unspecified chronic kidney disease: Secondary | ICD-10-CM | POA: Diagnosis not present

## 2023-07-12 DIAGNOSIS — M1990 Unspecified osteoarthritis, unspecified site: Secondary | ICD-10-CM | POA: Diagnosis not present

## 2023-07-12 DIAGNOSIS — N184 Chronic kidney disease, stage 4 (severe): Secondary | ICD-10-CM | POA: Diagnosis not present

## 2023-07-12 DIAGNOSIS — E785 Hyperlipidemia, unspecified: Secondary | ICD-10-CM | POA: Diagnosis not present

## 2023-07-12 DIAGNOSIS — Z794 Long term (current) use of insulin: Secondary | ICD-10-CM | POA: Diagnosis not present

## 2023-07-12 DIAGNOSIS — I6521 Occlusion and stenosis of right carotid artery: Secondary | ICD-10-CM | POA: Diagnosis not present

## 2023-07-12 DIAGNOSIS — N179 Acute kidney failure, unspecified: Secondary | ICD-10-CM | POA: Diagnosis not present

## 2023-07-12 DIAGNOSIS — M810 Age-related osteoporosis without current pathological fracture: Secondary | ICD-10-CM | POA: Diagnosis not present

## 2023-07-12 DIAGNOSIS — I5033 Acute on chronic diastolic (congestive) heart failure: Secondary | ICD-10-CM | POA: Diagnosis not present

## 2023-07-12 DIAGNOSIS — E1122 Type 2 diabetes mellitus with diabetic chronic kidney disease: Secondary | ICD-10-CM | POA: Diagnosis not present

## 2023-07-12 DIAGNOSIS — E041 Nontoxic single thyroid nodule: Secondary | ICD-10-CM | POA: Diagnosis not present

## 2023-07-12 DIAGNOSIS — D631 Anemia in chronic kidney disease: Secondary | ICD-10-CM | POA: Diagnosis not present

## 2023-07-12 DIAGNOSIS — E8770 Fluid overload, unspecified: Secondary | ICD-10-CM | POA: Diagnosis not present

## 2023-07-12 DIAGNOSIS — Z853 Personal history of malignant neoplasm of breast: Secondary | ICD-10-CM | POA: Diagnosis not present

## 2023-07-12 NOTE — Telephone Encounter (Signed)
Speech therapist with [facility] calling needing orders for  1 wk 1  2 wk 3 Call back # [phone] can take verbal or VM. Please advise Thank you

## 2023-07-12 NOTE — Patient Outreach (Signed)
Care Coordination   07/12/2023 Name: Emily Jennings MRN: [id number] DOB: 1933/10/26   Care Coordination Outreach Attempts:  An unsuccessful telephone outreach was attempted today to offer the patient information about available care coordination services.  Follow Up Plan:  Additional outreach attempts will be made to offer the patient care coordination information and services.   Encounter Outcome:  No Answer   Care Coordination Interventions:  No, not indicated     Seren Chaloux L. Noelle Penner, RN, BSN, [facility]  VBCI Care Management Coordinator  [phone]  Fax: [phone]

## 2023-07-13 NOTE — Telephone Encounter (Signed)
Verbal order given  

## 2023-07-15 NOTE — Progress Notes (Unsigned)
Established Patient Office Visit   Subjective  Patient ID: Emily Jennings, female    DOB: August 20, 1934  Age: 89 y.o. MRN: [id number]  No chief complaint on file.   She  has a past medical history of Allergy, Anxiety, ANXIETY DISORDER, GENERALIZED (07/15/2007), Arthritis, Cancer (HCC) (2009), Carcinoma in situ of breast (05/21/2008), Carotid stenosis, Cataract, Complication of anesthesia, Coronary artery disease, Diabetes mellitus, GERD (gastroesophageal reflux disease), Hernia, inguinal, Hyperglycemia, Hypertension, Insomnia (11/16/2011), Low blood potassium, Non-insulin dependent type 2 diabetes mellitus (HCC), Osteoporosis, Shortness of breath, Thickened endometrium (10/26/2017), and Ventricular tachycardia, non-sustained (HCC).  HPI  Patient presents to the clinic for     ROS    Objective:     There were no vitals taken for this visit. {Vitals History (Optional):23777}  Physical Exam   No results found for any visits on 07/16/23.  The ASCVD Risk score (Arnett DK, et al., 2019) failed to calculate for the following reasons:   The 2019 ASCVD risk score is only valid for ages 36 to 33    Assessment & Plan:  There are no diagnoses linked to this encounter.  No follow-ups on file.   Cruzita Lederer Newman Nip, FNP

## 2023-07-15 NOTE — Patient Instructions (Signed)

## 2023-07-16 ENCOUNTER — Ambulatory Visit (INDEPENDENT_AMBULATORY_CARE_PROVIDER_SITE_OTHER): Payer: Medicare Other | Admitting: Family Medicine

## 2023-07-16 ENCOUNTER — Encounter (INDEPENDENT_AMBULATORY_CARE_PROVIDER_SITE_OTHER): Payer: Self-pay | Admitting: *Deleted

## 2023-07-16 ENCOUNTER — Encounter: Payer: Self-pay | Admitting: Family Medicine

## 2023-07-16 VITALS — BP 163/80 | HR 73 | Ht 61.0 in | Wt 102.0 lb

## 2023-07-16 DIAGNOSIS — D509 Iron deficiency anemia, unspecified: Secondary | ICD-10-CM | POA: Diagnosis not present

## 2023-07-16 DIAGNOSIS — I5033 Acute on chronic diastolic (congestive) heart failure: Secondary | ICD-10-CM | POA: Diagnosis not present

## 2023-07-16 DIAGNOSIS — R1310 Dysphagia, unspecified: Secondary | ICD-10-CM

## 2023-07-16 DIAGNOSIS — Z794 Long term (current) use of insulin: Secondary | ICD-10-CM | POA: Diagnosis not present

## 2023-07-16 DIAGNOSIS — I10 Essential (primary) hypertension: Secondary | ICD-10-CM | POA: Diagnosis not present

## 2023-07-16 DIAGNOSIS — J9601 Acute respiratory failure with hypoxia: Secondary | ICD-10-CM | POA: Diagnosis not present

## 2023-07-16 DIAGNOSIS — D631 Anemia in chronic kidney disease: Secondary | ICD-10-CM | POA: Diagnosis not present

## 2023-07-16 DIAGNOSIS — I2510 Atherosclerotic heart disease of native coronary artery without angina pectoris: Secondary | ICD-10-CM | POA: Diagnosis not present

## 2023-07-16 DIAGNOSIS — Z853 Personal history of malignant neoplasm of breast: Secondary | ICD-10-CM | POA: Diagnosis not present

## 2023-07-16 DIAGNOSIS — M1990 Unspecified osteoarthritis, unspecified site: Secondary | ICD-10-CM | POA: Diagnosis not present

## 2023-07-16 DIAGNOSIS — Z7984 Long term (current) use of oral hypoglycemic drugs: Secondary | ICD-10-CM | POA: Diagnosis not present

## 2023-07-16 DIAGNOSIS — J690 Pneumonitis due to inhalation of food and vomit: Secondary | ICD-10-CM | POA: Diagnosis not present

## 2023-07-16 DIAGNOSIS — E1165 Type 2 diabetes mellitus with hyperglycemia: Secondary | ICD-10-CM | POA: Diagnosis not present

## 2023-07-16 DIAGNOSIS — N184 Chronic kidney disease, stage 4 (severe): Secondary | ICD-10-CM | POA: Diagnosis not present

## 2023-07-16 DIAGNOSIS — E1122 Type 2 diabetes mellitus with diabetic chronic kidney disease: Secondary | ICD-10-CM | POA: Diagnosis not present

## 2023-07-16 DIAGNOSIS — E871 Hypo-osmolality and hyponatremia: Secondary | ICD-10-CM | POA: Diagnosis not present

## 2023-07-16 DIAGNOSIS — E041 Nontoxic single thyroid nodule: Secondary | ICD-10-CM

## 2023-07-16 DIAGNOSIS — Z09 Encounter for follow-up examination after completed treatment for conditions other than malignant neoplasm: Secondary | ICD-10-CM

## 2023-07-16 DIAGNOSIS — E785 Hyperlipidemia, unspecified: Secondary | ICD-10-CM | POA: Diagnosis not present

## 2023-07-16 DIAGNOSIS — I6521 Occlusion and stenosis of right carotid artery: Secondary | ICD-10-CM | POA: Diagnosis not present

## 2023-07-16 DIAGNOSIS — N179 Acute kidney failure, unspecified: Secondary | ICD-10-CM | POA: Diagnosis not present

## 2023-07-16 DIAGNOSIS — K219 Gastro-esophageal reflux disease without esophagitis: Secondary | ICD-10-CM | POA: Diagnosis not present

## 2023-07-16 DIAGNOSIS — M810 Age-related osteoporosis without current pathological fracture: Secondary | ICD-10-CM | POA: Diagnosis not present

## 2023-07-16 DIAGNOSIS — E8770 Fluid overload, unspecified: Secondary | ICD-10-CM | POA: Diagnosis not present

## 2023-07-16 DIAGNOSIS — I130 Hypertensive heart and chronic kidney disease with heart failure and stage 1 through stage 4 chronic kidney disease, or unspecified chronic kidney disease: Secondary | ICD-10-CM | POA: Diagnosis not present

## 2023-07-16 MED ORDER — ALBUTEROL SULFATE HFA 108 (90 BASE) MCG/ACT IN AERS: 2.0000 | INHALATION_SPRAY | RESPIRATORY_TRACT | 1 refills | Status: DC | PRN

## 2023-07-16 NOTE — Assessment & Plan Note (Signed)
Left small  thyroid nodule felt on light palpation  [place] ordered for further evaluation Advise patient to continue PPI Discussed choose soft, moist foods, chew thoroughly, and take small bites. Sit upright while eating, take sips between bites, and avoid lying down afterward.

## 2023-07-16 NOTE — Assessment & Plan Note (Signed)
Patient here for Hospital follow up for Pneumonia Patient reports completing abx course Labs ordered in today's visit Advise to use Albuterol inhaler for SOB events Patient verbalize and agree plan of care

## 2023-07-17 DIAGNOSIS — E785 Hyperlipidemia, unspecified: Secondary | ICD-10-CM | POA: Diagnosis not present

## 2023-07-17 DIAGNOSIS — K219 Gastro-esophageal reflux disease without esophagitis: Secondary | ICD-10-CM | POA: Diagnosis not present

## 2023-07-17 DIAGNOSIS — Z853 Personal history of malignant neoplasm of breast: Secondary | ICD-10-CM | POA: Diagnosis not present

## 2023-07-17 DIAGNOSIS — R1310 Dysphagia, unspecified: Secondary | ICD-10-CM | POA: Diagnosis not present

## 2023-07-17 DIAGNOSIS — Z794 Long term (current) use of insulin: Secondary | ICD-10-CM | POA: Diagnosis not present

## 2023-07-17 DIAGNOSIS — E1165 Type 2 diabetes mellitus with hyperglycemia: Secondary | ICD-10-CM | POA: Diagnosis not present

## 2023-07-17 DIAGNOSIS — N179 Acute kidney failure, unspecified: Secondary | ICD-10-CM | POA: Diagnosis not present

## 2023-07-17 DIAGNOSIS — I130 Hypertensive heart and chronic kidney disease with heart failure and stage 1 through stage 4 chronic kidney disease, or unspecified chronic kidney disease: Secondary | ICD-10-CM | POA: Diagnosis not present

## 2023-07-17 DIAGNOSIS — I6521 Occlusion and stenosis of right carotid artery: Secondary | ICD-10-CM | POA: Diagnosis not present

## 2023-07-17 DIAGNOSIS — I5033 Acute on chronic diastolic (congestive) heart failure: Secondary | ICD-10-CM | POA: Diagnosis not present

## 2023-07-17 DIAGNOSIS — M810 Age-related osteoporosis without current pathological fracture: Secondary | ICD-10-CM | POA: Diagnosis not present

## 2023-07-17 DIAGNOSIS — M1990 Unspecified osteoarthritis, unspecified site: Secondary | ICD-10-CM | POA: Diagnosis not present

## 2023-07-17 DIAGNOSIS — J9601 Acute respiratory failure with hypoxia: Secondary | ICD-10-CM | POA: Diagnosis not present

## 2023-07-17 DIAGNOSIS — E041 Nontoxic single thyroid nodule: Secondary | ICD-10-CM | POA: Diagnosis not present

## 2023-07-17 DIAGNOSIS — E8770 Fluid overload, unspecified: Secondary | ICD-10-CM | POA: Diagnosis not present

## 2023-07-17 DIAGNOSIS — J690 Pneumonitis due to inhalation of food and vomit: Secondary | ICD-10-CM | POA: Diagnosis not present

## 2023-07-17 DIAGNOSIS — Z7984 Long term (current) use of oral hypoglycemic drugs: Secondary | ICD-10-CM | POA: Diagnosis not present

## 2023-07-17 DIAGNOSIS — I2510 Atherosclerotic heart disease of native coronary artery without angina pectoris: Secondary | ICD-10-CM | POA: Diagnosis not present

## 2023-07-17 DIAGNOSIS — D631 Anemia in chronic kidney disease: Secondary | ICD-10-CM | POA: Diagnosis not present

## 2023-07-17 DIAGNOSIS — N184 Chronic kidney disease, stage 4 (severe): Secondary | ICD-10-CM | POA: Diagnosis not present

## 2023-07-17 DIAGNOSIS — E871 Hypo-osmolality and hyponatremia: Secondary | ICD-10-CM | POA: Diagnosis not present

## 2023-07-17 DIAGNOSIS — E1122 Type 2 diabetes mellitus with diabetic chronic kidney disease: Secondary | ICD-10-CM | POA: Diagnosis not present

## 2023-07-17 LAB — CBC WITH DIFFERENTIAL/PLATELET
Basophils Absolute: 0 10*3/uL (ref 0.0–0.2)
Basos: 0 %
EOS (ABSOLUTE): 0 10*3/uL (ref 0.0–0.4)
Eos: 0 %
Hematocrit: 34 % (ref 34.0–46.6)
Hemoglobin: 11 g/dL — ABNORMAL LOW (ref 11.1–15.9)
Immature Grans (Abs): 0.1 10*3/uL (ref 0.0–0.1)
Immature Granulocytes: 1 %
Lymphocytes Absolute: 0.7 10*3/uL (ref 0.7–3.1)
Lymphs: 6 %
MCH: 30.6 pg (ref 26.6–33.0)
MCHC: 32.4 g/dL (ref 31.5–35.7)
MCV: 95 fL (ref 79–97)
Monocytes Absolute: 1.1 10*3/uL — ABNORMAL HIGH (ref 0.1–0.9)
Monocytes: 10 %
Neutrophils Absolute: 9.1 10*3/uL — ABNORMAL HIGH (ref 1.4–7.0)
Neutrophils: 83 %
Platelets: 319 10*3/uL (ref 150–450)
RBC: 3.59 x10E6/uL — ABNORMAL LOW (ref 3.77–5.28)
RDW: 13.1 % (ref 11.7–15.4)
WBC: 10.9 10*3/uL — ABNORMAL HIGH (ref 3.4–10.8)

## 2023-07-17 LAB — IRON,TIBC AND FERRITIN PANEL
Ferritin: 389 ng/mL — ABNORMAL HIGH (ref 15–150)
Iron Saturation: 10 % — ABNORMAL LOW (ref 15–55)
Iron: 28 ug/dL (ref 27–139)
Total Iron Binding Capacity: 288 ug/dL (ref 250–450)
UIBC: 260 ug/dL (ref 118–369)

## 2023-07-17 LAB — BMP8+EGFR
BUN/Creatinine Ratio: 17 (ref 12–28)
BUN: 54 mg/dL — ABNORMAL HIGH (ref 8–27)
CO2: 29 mmol/L (ref 20–29)
Calcium: 9.9 mg/dL (ref 8.7–10.3)
Chloride: 83 mmol/L — ABNORMAL LOW (ref 96–106)
Creatinine, Ser: 3.09 mg/dL — ABNORMAL HIGH (ref 0.57–1.00)
Glucose: 124 mg/dL — ABNORMAL HIGH (ref 70–99)
Potassium: 4.4 mmol/L (ref 3.5–5.2)
Sodium: 130 mmol/L — ABNORMAL LOW (ref 134–144)
eGFR: 14 mL/min/{1.73_m2} — ABNORMAL LOW (ref >59)

## 2023-07-18 ENCOUNTER — Other Ambulatory Visit: Payer: Self-pay

## 2023-07-18 ENCOUNTER — Inpatient Hospital Stay (HOSPITAL_COMMUNITY): Payer: Medicare Other

## 2023-07-18 ENCOUNTER — Emergency Department (HOSPITAL_COMMUNITY): Payer: Medicare Other

## 2023-07-18 ENCOUNTER — Encounter (HOSPITAL_COMMUNITY): Payer: Self-pay | Admitting: Emergency Medicine

## 2023-07-18 ENCOUNTER — Inpatient Hospital Stay (HOSPITAL_COMMUNITY)
Admission: EM | Admit: 2023-07-18 | Discharge: 2023-07-25 | DRG: 330 | Disposition: A | Discharge status: Home / Self Care | Payer: Medicare Other | Attending: Internal Medicine | Admitting: Internal Medicine

## 2023-07-18 DIAGNOSIS — E785 Hyperlipidemia, unspecified: Secondary | ICD-10-CM | POA: Diagnosis present

## 2023-07-18 DIAGNOSIS — Z7984 Long term (current) use of oral hypoglycemic drugs: Secondary | ICD-10-CM | POA: Diagnosis not present

## 2023-07-18 DIAGNOSIS — E1122 Type 2 diabetes mellitus with diabetic chronic kidney disease: Secondary | ICD-10-CM | POA: Diagnosis present

## 2023-07-18 DIAGNOSIS — D631 Anemia in chronic kidney disease: Secondary | ICD-10-CM | POA: Diagnosis present

## 2023-07-18 DIAGNOSIS — I5032 Chronic diastolic (congestive) heart failure: Secondary | ICD-10-CM | POA: Diagnosis present

## 2023-07-18 DIAGNOSIS — I110 Hypertensive heart disease with heart failure: Secondary | ICD-10-CM | POA: Diagnosis not present

## 2023-07-18 DIAGNOSIS — I10 Essential (primary) hypertension: Secondary | ICD-10-CM | POA: Diagnosis present

## 2023-07-18 DIAGNOSIS — K4030 Unilateral inguinal hernia, with obstruction, without gangrene, not specified as recurrent: Principal | ICD-10-CM | POA: Diagnosis present

## 2023-07-18 DIAGNOSIS — E041 Nontoxic single thyroid nodule: Secondary | ICD-10-CM | POA: Diagnosis not present

## 2023-07-18 DIAGNOSIS — Z4682 Encounter for fitting and adjustment of non-vascular catheter: Secondary | ICD-10-CM | POA: Diagnosis not present

## 2023-07-18 DIAGNOSIS — N200 Calculus of kidney: Secondary | ICD-10-CM | POA: Diagnosis not present

## 2023-07-18 DIAGNOSIS — Z853 Personal history of malignant neoplasm of breast: Secondary | ICD-10-CM | POA: Diagnosis not present

## 2023-07-18 DIAGNOSIS — M1990 Unspecified osteoarthritis, unspecified site: Secondary | ICD-10-CM | POA: Diagnosis not present

## 2023-07-18 DIAGNOSIS — R059 Cough, unspecified: Secondary | ICD-10-CM | POA: Diagnosis not present

## 2023-07-18 DIAGNOSIS — I2510 Atherosclerotic heart disease of native coronary artery without angina pectoris: Secondary | ICD-10-CM | POA: Diagnosis not present

## 2023-07-18 DIAGNOSIS — Z79899 Other long term (current) drug therapy: Secondary | ICD-10-CM

## 2023-07-18 DIAGNOSIS — Z825 Family history of asthma and other chronic lower respiratory diseases: Secondary | ICD-10-CM

## 2023-07-18 DIAGNOSIS — E161 Other hypoglycemia: Secondary | ICD-10-CM | POA: Diagnosis not present

## 2023-07-18 DIAGNOSIS — I130 Hypertensive heart and chronic kidney disease with heart failure and stage 1 through stage 4 chronic kidney disease, or unspecified chronic kidney disease: Secondary | ICD-10-CM | POA: Diagnosis present

## 2023-07-18 DIAGNOSIS — Z833 Family history of diabetes mellitus: Secondary | ICD-10-CM

## 2023-07-18 DIAGNOSIS — I5033 Acute on chronic diastolic (congestive) heart failure: Secondary | ICD-10-CM

## 2023-07-18 DIAGNOSIS — E162 Hypoglycemia, unspecified: Secondary | ICD-10-CM | POA: Diagnosis present

## 2023-07-18 DIAGNOSIS — N179 Acute kidney failure, unspecified: Secondary | ICD-10-CM | POA: Diagnosis present

## 2023-07-18 DIAGNOSIS — K55059 Acute (reversible) ischemia of intestine, part and extent unspecified: Secondary | ICD-10-CM | POA: Diagnosis not present

## 2023-07-18 DIAGNOSIS — Z8673 Personal history of transient ischemic attack (TIA), and cerebral infarction without residual deficits: Secondary | ICD-10-CM

## 2023-07-18 DIAGNOSIS — K219 Gastro-esophageal reflux disease without esophagitis: Secondary | ICD-10-CM | POA: Diagnosis not present

## 2023-07-18 DIAGNOSIS — K4130 Unilateral femoral hernia, with obstruction, without gangrene, not specified as recurrent: Secondary | ICD-10-CM | POA: Diagnosis not present

## 2023-07-18 DIAGNOSIS — Z823 Family history of stroke: Secondary | ICD-10-CM

## 2023-07-18 DIAGNOSIS — K922 Gastrointestinal hemorrhage, unspecified: Secondary | ICD-10-CM | POA: Diagnosis not present

## 2023-07-18 DIAGNOSIS — E11649 Type 2 diabetes mellitus with hypoglycemia without coma: Secondary | ICD-10-CM | POA: Diagnosis present

## 2023-07-18 DIAGNOSIS — R188 Other ascites: Secondary | ICD-10-CM | POA: Diagnosis not present

## 2023-07-18 DIAGNOSIS — K56609 Unspecified intestinal obstruction, unspecified as to partial versus complete obstruction: Secondary | ICD-10-CM | POA: Diagnosis present

## 2023-07-18 DIAGNOSIS — E871 Hypo-osmolality and hyponatremia: Secondary | ICD-10-CM | POA: Diagnosis not present

## 2023-07-18 DIAGNOSIS — Z8701 Personal history of pneumonia (recurrent): Secondary | ICD-10-CM

## 2023-07-18 DIAGNOSIS — I6521 Occlusion and stenosis of right carotid artery: Secondary | ICD-10-CM | POA: Diagnosis not present

## 2023-07-18 DIAGNOSIS — K559 Vascular disorder of intestine, unspecified: Secondary | ICD-10-CM

## 2023-07-18 DIAGNOSIS — N189 Chronic kidney disease, unspecified: Secondary | ICD-10-CM | POA: Diagnosis present

## 2023-07-18 DIAGNOSIS — J690 Pneumonitis due to inhalation of food and vomit: Secondary | ICD-10-CM | POA: Diagnosis not present

## 2023-07-18 DIAGNOSIS — K5730 Diverticulosis of large intestine without perforation or abscess without bleeding: Secondary | ICD-10-CM | POA: Diagnosis not present

## 2023-07-18 DIAGNOSIS — R6889 Other general symptoms and signs: Secondary | ICD-10-CM | POA: Diagnosis not present

## 2023-07-18 DIAGNOSIS — I509 Heart failure, unspecified: Secondary | ICD-10-CM | POA: Diagnosis not present

## 2023-07-18 DIAGNOSIS — E8770 Fluid overload, unspecified: Secondary | ICD-10-CM | POA: Diagnosis not present

## 2023-07-18 DIAGNOSIS — D62 Acute posthemorrhagic anemia: Secondary | ICD-10-CM | POA: Diagnosis not present

## 2023-07-18 DIAGNOSIS — E1165 Type 2 diabetes mellitus with hyperglycemia: Secondary | ICD-10-CM | POA: Diagnosis present

## 2023-07-18 DIAGNOSIS — R404 Transient alteration of awareness: Secondary | ICD-10-CM | POA: Diagnosis not present

## 2023-07-18 DIAGNOSIS — Z885 Allergy status to narcotic agent status: Secondary | ICD-10-CM

## 2023-07-18 DIAGNOSIS — J9601 Acute respiratory failure with hypoxia: Secondary | ICD-10-CM | POA: Diagnosis not present

## 2023-07-18 DIAGNOSIS — E876 Hypokalemia: Secondary | ICD-10-CM | POA: Diagnosis not present

## 2023-07-18 DIAGNOSIS — M810 Age-related osteoporosis without current pathological fracture: Secondary | ICD-10-CM | POA: Diagnosis present

## 2023-07-18 DIAGNOSIS — Z83438 Family history of other disorder of lipoprotein metabolism and other lipidemia: Secondary | ICD-10-CM

## 2023-07-18 DIAGNOSIS — I499 Cardiac arrhythmia, unspecified: Secondary | ICD-10-CM | POA: Diagnosis not present

## 2023-07-18 DIAGNOSIS — E119 Type 2 diabetes mellitus without complications: Secondary | ICD-10-CM

## 2023-07-18 DIAGNOSIS — R9389 Abnormal findings on diagnostic imaging of other specified body structures: Secondary | ICD-10-CM | POA: Diagnosis not present

## 2023-07-18 DIAGNOSIS — Z8249 Family history of ischemic heart disease and other diseases of the circulatory system: Secondary | ICD-10-CM | POA: Diagnosis not present

## 2023-07-18 DIAGNOSIS — Z794 Long term (current) use of insulin: Secondary | ICD-10-CM

## 2023-07-18 DIAGNOSIS — Z1152 Encounter for screening for COVID-19: Secondary | ICD-10-CM | POA: Diagnosis not present

## 2023-07-18 DIAGNOSIS — N184 Chronic kidney disease, stage 4 (severe): Secondary | ICD-10-CM | POA: Diagnosis present

## 2023-07-18 DIAGNOSIS — Z743 Need for continuous supervision: Secondary | ICD-10-CM | POA: Diagnosis not present

## 2023-07-18 DIAGNOSIS — Z888 Allergy status to other drugs, medicaments and biological substances status: Secondary | ICD-10-CM

## 2023-07-18 DIAGNOSIS — Z800 Family history of malignant neoplasm of digestive organs: Secondary | ICD-10-CM

## 2023-07-18 DIAGNOSIS — K4090 Unilateral inguinal hernia, without obstruction or gangrene, not specified as recurrent: Secondary | ICD-10-CM | POA: Diagnosis not present

## 2023-07-18 DIAGNOSIS — Z86000 Personal history of in-situ neoplasm of breast: Secondary | ICD-10-CM

## 2023-07-18 DIAGNOSIS — R1310 Dysphagia, unspecified: Secondary | ICD-10-CM | POA: Diagnosis not present

## 2023-07-18 LAB — CBC WITH DIFFERENTIAL/PLATELET
Abs Immature Granulocytes: 0.03 10*3/uL (ref 0.00–0.07)
Basophils Absolute: 0 10*3/uL (ref 0.0–0.1)
Basophils Relative: 0 %
Eosinophils Absolute: 0 10*3/uL (ref 0.0–0.5)
Eosinophils Relative: 0 %
HCT: 33.3 % — ABNORMAL LOW (ref 36.0–46.0)
Hemoglobin: 11.3 g/dL — ABNORMAL LOW (ref 12.0–15.0)
Immature Granulocytes: 0 %
Lymphocytes Relative: 8 %
Lymphs Abs: 0.6 10*3/uL — ABNORMAL LOW (ref 0.7–4.0)
MCH: 31.1 pg (ref 26.0–34.0)
MCHC: 33.9 g/dL (ref 30.0–36.0)
MCV: 91.7 fL (ref 80.0–100.0)
Monocytes Absolute: 0.8 10*3/uL (ref 0.1–1.0)
Monocytes Relative: 10 %
Neutro Abs: 6.2 10*3/uL (ref 1.7–7.7)
Neutrophils Relative %: 82 %
Platelets: 314 10*3/uL (ref 150–400)
RBC: 3.63 MIL/uL — ABNORMAL LOW (ref 3.87–5.11)
RDW: 13.2 % (ref 11.5–15.5)
WBC: 7.6 10*3/uL (ref 4.0–10.5)
nRBC: 0 % (ref 0.0–0.2)

## 2023-07-18 LAB — COMPREHENSIVE METABOLIC PANEL
ALT: 16 U/L (ref 0–44)
AST: 27 U/L (ref 15–41)
Albumin: 3.9 g/dL (ref 3.5–5.0)
Alkaline Phosphatase: 57 U/L (ref 38–126)
Anion gap: 16 — ABNORMAL HIGH (ref 5–15)
BUN: 77 mg/dL — ABNORMAL HIGH (ref 8–23)
CO2: 32 mmol/L (ref 22–32)
Calcium: 9.6 mg/dL (ref 8.9–10.3)
Chloride: 79 mmol/L — ABNORMAL LOW (ref 98–111)
Creatinine, Ser: 4.56 mg/dL — ABNORMAL HIGH (ref 0.44–1.00)
GFR, Estimated: 9 mL/min — ABNORMAL LOW (ref >60)
Glucose, Bld: 86 mg/dL (ref 70–99)
Potassium: 4.4 mmol/L (ref 3.5–5.1)
Sodium: 127 mmol/L — ABNORMAL LOW (ref 135–145)
Total Bilirubin: 1 mg/dL (ref 0.3–1.2)
Total Protein: 7.1 g/dL (ref 6.5–8.1)

## 2023-07-18 LAB — CBG MONITORING, ED
Glucose-Capillary: 153 mg/dL — ABNORMAL HIGH (ref 70–99)
Glucose-Capillary: 213 mg/dL — ABNORMAL HIGH (ref 70–99)

## 2023-07-18 LAB — LIPASE, BLOOD: Lipase: 39 U/L (ref 11–51)

## 2023-07-18 LAB — SARS CORONAVIRUS 2 BY RT PCR: SARS Coronavirus 2 by RT PCR: NEGATIVE

## 2023-07-18 LAB — MAGNESIUM: Magnesium: 2.5 mg/dL — ABNORMAL HIGH (ref 1.7–2.4)

## 2023-07-18 LAB — TROPONIN I (HIGH SENSITIVITY)
Troponin I (High Sensitivity): 26 ng/L — ABNORMAL HIGH (ref <18)
Troponin I (High Sensitivity): 22 ng/L — ABNORMAL HIGH (ref <18)

## 2023-07-18 LAB — BRAIN NATRIURETIC PEPTIDE: B Natriuretic Peptide: 378 pg/mL — ABNORMAL HIGH (ref 0.0–100.0)

## 2023-07-18 MED ORDER — SODIUM CHLORIDE 0.9 % IV BOLUS
1000.0000 mL | Freq: Once | INTRAVENOUS | Status: DC
Start: 2023-07-18 — End: 2023-07-18

## 2023-07-18 MED ORDER — ONDANSETRON HCL 4 MG/2ML IJ SOLN
4.0000 mg | Freq: Once | INTRAMUSCULAR | Status: AC
  Administered 2023-07-18: 4 mg via INTRAVENOUS
  Filled 2023-07-18: qty 2

## 2023-07-18 MED ORDER — FENTANYL CITRATE PF 50 MCG/ML IJ SOSY: 25.0000 ug | PREFILLED_SYRINGE | INTRAMUSCULAR | Status: DC | PRN

## 2023-07-18 MED ORDER — DEXTROSE-SODIUM CHLORIDE 5-0.9 % IV SOLN: INTRAVENOUS | Status: AC

## 2023-07-18 MED ORDER — PANTOPRAZOLE SODIUM 40 MG IV SOLR
40.0000 mg | Freq: Once | INTRAVENOUS | Status: AC
  Administered 2023-07-18: 40 mg via INTRAVENOUS
  Filled 2023-07-18: qty 10

## 2023-07-18 MED ORDER — SODIUM CHLORIDE 0.9 % IV SOLN
Freq: Once | INTRAVENOUS | Status: AC
Start: 2023-07-18 — End: 2023-07-18

## 2023-07-18 MED ORDER — FENTANYL CITRATE PF 50 MCG/ML IJ SOSY
50.0000 ug | PREFILLED_SYRINGE | Freq: Once | INTRAMUSCULAR | Status: AC
  Administered 2023-07-18: 50 ug via INTRAVENOUS
  Filled 2023-07-18: qty 1

## 2023-07-18 MED ORDER — SODIUM CHLORIDE 0.9 % IV BOLUS
500.0000 mL | Freq: Once | INTRAVENOUS | Status: AC
  Administered 2023-07-18: 500 mL via INTRAVENOUS

## 2023-07-18 NOTE — Assessment & Plan Note (Signed)
Sugar down to 50 due to poor oral intake over the past 2 weeks. -D5 fluids -CBG every 8 hourly

## 2023-07-18 NOTE — ED Triage Notes (Signed)
Pt c/o abd pain, distended stomach and poor po intake x 2 weeks. When ems arrived pt's blood sugar was 50. Ems gave pt 400cc of D 10.

## 2023-07-18 NOTE — Assessment & Plan Note (Signed)
Systolic 150s to 301S. -Hold spironolactone, nifedipine, carvedilol, Lasix, hydralazine 100 mg -As needed hydralazine 5 mg for systolic greater than 170

## 2023-07-18 NOTE — H&P (Signed)
History and Physical    Emily Jennings ZOX:[id number] DOB: Mar 05, 1934 DOA: 07/18/2023  PCP: [name], MD   Patient coming from: Home  I have personally briefly reviewed patient's old medical records in [facility]  Chief Complaint: Abdominal pain and distension  HPI: Emily Jennings is a 89 y.o. female with medical history significant for diabetes mellitus, hypertension, CKD 4, CHF, coronary artery disease. Patient was brought to the ED reports of abdominal pain and distention, and really any oral intake over the past 2 weeks.  On EMS arrival blood sugar was 50.  400 cc D10 given. On my evaluation patient is awake and alert and able to answer questions, she confirms above history.  She denies urinary symptoms.  He also has a swelling to her left hip that has been there for about a month.  Recent hospitalization 10/15 to 10/18 for pneumonia deemed aspiration pneumonia, with acute hypoxic respiratory failure and CHF.  Treated with IV antibiotics and discharged on Keflex and doxycycline.  Diuresed with IV Lasix and discharged on Aldactone and Lasix.  Weaned off O2.  ED Course: Systolic 130s to 811B.  Stable vitals.  Creatinine elevated at 4.56. CT abdomen and pelvis-high-grade small bowel obstruction transition points left inguinal hernia containing a small portion of mid to distal jejunum. EDP was able to partially reduce hernia. Gen surgeon Dr. Juliann Pulse repair pending if incarceration reduced further or not.  Review of Systems: As per HPI all other systems reviewed and negative.  Past Medical History:  Diagnosis Date   Allergy    Anxiety    ANXIETY DISORDER, GENERALIZED 07/15/2007   Qualifier: Diagnosis of  By: Chipper Herb     Arthritis    Cancer [facility]) 2009   breast, carcinoma in situ left   Carcinoma in situ of breast 05/21/2008   Qualifier: Diagnosis of  By: [name] MD, Margaret  Diagnosed in 2009, completed 5 year course of tamoxifen, no evidence  of recurrence    Carotid stenosis    11/16/2005  mild plaque formation and stenosis proximal right ECA   Cataract    Complication of anesthesia    Coronary artery disease    cardiac catheterization on 03/20/2006  LAD mid 40% stenosis, left circumflex mild 40% stenosis, RCA mid-vessel 40% to 50% lesion   EF 60%   Diabetes mellitus    GERD (gastroesophageal reflux disease)    Hernia, inguinal    left   Hyperglycemia    Hypertension    Insomnia 11/16/2011   Low blood potassium    Non-insulin dependent type 2 diabetes mellitus (HCC)    Osteoporosis    Shortness of breath    2D Echocardiogram 01/26/2009   EF of greater than 55%, mild MR, mild TR, normal ventricular function   Thickened endometrium 10/26/2017   Noted by gyne in 2017, missed 6 month follow up, referred in 09/2017   Ventricular tachycardia, non-sustained (HCC)    developed during stress test 02/08/2006, spontaneously aborted, mild reversible apical defect    Past Surgical History:  Procedure Laterality Date   BREAST LUMPECTOMY Left 2009   Left breast 2009   CATARACT EXTRACTION W/PHACO Left 10/28/2014   Procedure: PHACO EMULSION CATARACT EXTRACTION WITH INTRAOCULAR LENS IMPLANT LEFT EYE (IOC);  Surgeon: Chalmers Guest, MD;  Location: [facility] OR;  Service: Ophthalmology;  Laterality: Left;   COLONOSCOPY     cyst removed from left foot     REFRACTIVE SURGERY Left      reports that  she has never smoked. She has never been exposed to tobacco smoke. She has never used smokeless tobacco. She reports that she does not drink alcohol and does not use drugs.  Allergies  Allergen Reactions   Amlodipine Swelling   Benadryl [Diphenhydramine Hcl] Hypertension   Citalopram Other (See Comments)    unknown   Farxiga [Dapagliflozin] Other (See Comments)    DKA   Metformin And Related Diarrhea    Lost appetite and weight    Tramadol Other (See Comments)    Felt light headed and dizzy   Crestor [Rosuvastatin] Other (See Comments)    "Feet  swelling", makes her feel weak. Pt takes 5mg  at home    Family History  Problem Relation Age of Onset   Hypertension Mother    Hyperlipidemia Mother    Stroke Mother    Urticaria Mother    Cancer Father        pancreatic   Colon cancer Father    Heart disease Brother 40       bypass   Heart disease Brother 27       bypass   Arthritis Other    Asthma Other    Diabetes Other    Colon cancer Paternal Aunt    Esophageal cancer Neg Hx    Stomach cancer Neg Hx    Rectal cancer Neg Hx    Prior to Admission medications   Medication Sig Start Date End Date Taking? Authorizing Provider  acetaminophen (TYLENOL) 325 MG tablet Take 2 tablets (650 mg total) by mouth every 6 (six) hours as needed for mild pain (pain score 1-3) (or Fever >/= 101). 07/06/23   Shon Hale, MD  albuterol (VENTOLIN HFA) 108 (90 Base) MCG/ACT inhaler Inhale 2 puffs into the lungs every 2 (two) hours as needed for wheezing or shortness of breath. 07/16/23   Del Nigel Berthold, FNP  calcitRIOL (ROCALTROL) 0.25 MCG capsule Take 0.25 mcg by mouth 3 (three) times a week.    [provider]  carvedilol (COREG) 25 MG tablet Take 1 tablet (25 mg total) by mouth 2 (two) times daily with a meal. 05/28/23   Danford, Earl Lites, MD  FARXIGA 10 MG TABS tablet Take 10 mg by mouth every morning. 07/01/23   [provider]  furosemide (LASIX) 40 MG tablet Take 1 tablet (40 mg total) by mouth 2 (two) times daily. 07/06/23 07/05/24  Shon Hale, MD  hydrALAZINE (APRESOLINE) 100 MG tablet Take 1 tablet (100 mg total) by mouth 3 (three) times daily. 07/06/23   Pedram Goodchild, Courage, MD  insulin degludec (TRESIBA FLEXTOUCH) 100 UNIT/ML FlexTouch Pen Inject 5 Units into the skin daily. 05/01/23   Shamleffer, Konrad Dolores, MD  insulin lispro (HUMALOG KWIKPEN) 100 UNIT/ML KwikPen Inject 5 Units into the skin 3 (three) times daily. Max daily 30 units 05/01/23   Shamleffer, Konrad Dolores, MD  NIFEdipine (ADALAT  CC) 60 MG 24 hr tablet Take 1 tablet (60 mg total) by mouth daily. 07/06/23   Shon Hale, MD  pantoprazole (PROTONIX) 40 MG tablet Take 1 tablet (40 mg total) by mouth daily. 07/06/23 07/05/24  Shon Hale, MD  potassium chloride (KLOR-CON) 10 MEQ tablet Take 1 tablet (10 mEq total) by mouth daily. Take While taking Lasix/furosemide 07/06/23   Larron Armor, Courage, MD  rosuvastatin (CRESTOR) 5 MG tablet TAKE 1 TABLET (5 MG TOTAL) BY MOUTH DAILY. Patient not taking: Reported on 07/16/2023 01/15/23   [name], MD  spironolactone (ALDACTONE) 50 MG tablet Take 1  tablet (50 mg total) by mouth daily. 07/06/23   Shon Hale, MD    Physical Exam: Vitals:   07/18/23 1602 07/18/23 1930 07/18/23 1945  BP: (!) 151/67 (!) 172/79 (!) 171/75  Pulse: 67 73 70  Resp: 18 15 14   Temp: 97.6 F (36.4 C) 97.7 F (36.5 C) 97.7 F (36.5 C)  TempSrc: Oral Oral Oral  SpO2: 97% 97% 94%  Weight: 46.3 kg    Height: 5\' 1"  (1.549 m)      Constitutional: Frail, calm, comfortable Vitals:   07/18/23 1602 07/18/23 1930 07/18/23 1945  BP: (!) 151/67 (!) 172/79 (!) 171/75  Pulse: 67 73 70  Resp: 18 15 14   Temp: 97.6 F (36.4 C) 97.7 F (36.5 C) 97.7 F (36.5 C)  TempSrc: Oral Oral Oral  SpO2: 97% 97% 94%  Weight: 46.3 kg    Height: 5\' 1"  (1.549 m)     Eyes: PERRL, lids and conjunctivae normal ENMT: Mucous membranes are dry Neck: normal, supple, no masses, no thyromegaly Respiratory: clear to auscultation bilaterally, no wheezing, no crackles. Normal respiratory effort. No accessory muscle use.  Cardiovascular: Regular rate and rhythm, 2/6 systolic murmurs, no rubs / gallops. No extremity edema.  Extremities warm. Abdomen: Abdomen distended, firm, lightly palpated, minimal tenderness, no masses palpated. No hepatosplenomegaly.  Musculoskeletal: no clubbing / cyanosis. No joint deformity upper and lower extremities.  Skin: Nontender swelling to left hip, measuring at least 10 cm across,  soft,  no rashes, lesions, ulcers. Neurologic: No facial symmetry, moving extremities spontaneously, speech fluent.  Psychiatric: Normal judgment and insight. Alert and oriented x person, place and situation.   Labs on Admission: I have personally reviewed following labs and imaging studies  CBC: Recent Labs  Lab 07/16/23 0921 07/18/23 1702  WBC 10.9* 7.6  NEUTROABS 9.1* 6.2  HGB 11.0* 11.3*  HCT 34.0 33.3*  MCV 95 91.7  PLT 319 314   Basic Metabolic Panel: Recent Labs  Lab 07/16/23 0921 07/18/23 1702  NA 130* 127*  K 4.4 4.4  CL 83* 79*  CO2 29 32  GLUCOSE 124* 86  BUN 54* 77*  CREATININE 3.09* 4.56*  CALCIUM 9.9 9.6  MG  --  2.5*   GFR: Estimated Creatinine Clearance: 6.4 mL/min (A) (by C-G formula based on SCr of 4.56 mg/dL (H)). Liver Function Tests: Recent Labs  Lab 07/18/23 1702  AST 27  ALT 16  ALKPHOS 57  BILITOT 1.0  PROT 7.1  ALBUMIN 3.9   Recent Labs  Lab 07/18/23 1702  LIPASE 39   CBG: Recent Labs  Lab 07/18/23 1600  GLUCAP 153*   Anemia Panel: Recent Labs    07/16/23 0921  FERRITIN 389*  TIBC 288  IRON 28   Urine analysis:    Component Value Date/Time   COLORURINE YELLOW 05/20/2023 1535   APPEARANCEUR CLEAR 05/20/2023 1535   LABSPEC 1.013 05/20/2023 1535   PHURINE 5.0 05/20/2023 1535   GLUCOSEU 50 (A) 05/20/2023 1535   HGBUR NEGATIVE 05/20/2023 1535   HGBUR trace-intact 04/28/2010 1259   BILIRUBINUR NEGATIVE 05/20/2023 1535   BILIRUBINUR neg 09/29/2013 1341   KETONESUR NEGATIVE 05/20/2023 1535   PROTEINUR >=300 (A) 05/20/2023 1535   UROBILINOGEN 0.2 09/29/2013 1341   UROBILINOGEN 0.2 12/28/2010 1743   NITRITE NEGATIVE 05/20/2023 1535   LEUKOCYTESUR NEGATIVE 05/20/2023 1535    Radiological Exams on Admission: DG Chest 1 View  Result Date: 07/18/2023 CLINICAL DATA:  Cough EXAM: CHEST  1 VIEW COMPARISON:  07/03/2023, 07/04/2023 FINDINGS: Single frontal  view of the chest demonstrates an unremarkable cardiac silhouette.  Interval resolution of the left upper lobe airspace disease and effusions seen previously. No pneumothorax. No acute bony abnormalities. IMPRESSION: 1. No acute intrathoracic process. Resolution of the left lower lobe pneumonia and effusion seen previously. 2. Please refer to prior CT chest report 07/04/2023 describing recommended follow-up for an irregular left perihilar nodule, not readily apparent on this x-ray. Electronically Signed   By: Sharlet Salina M.D.   On: 07/18/2023 19:23   CT ABDOMEN PELVIS WO CONTRAST  Result Date: 07/18/2023 CLINICAL DATA:  Abdominal pain, nausea and vomiting EXAM: CT ABDOMEN AND PELVIS WITHOUT CONTRAST TECHNIQUE: Multidetector CT imaging of the abdomen and pelvis was performed following the standard protocol without IV contrast. Unenhanced CT was performed per clinician order. Lack of IV contrast limits sensitivity and specificity, especially for evaluation of abdominal/pelvic solid viscera. RADIATION DOSE REDUCTION: This exam was performed according to the departmental dose-optimization program which includes automated exposure control, adjustment of the mA and/or kV according to patient size and/or use of iterative reconstruction technique. COMPARISON:  01/02/2022 FINDINGS: Lower chest: No acute pleural or parenchymal lung disease. Cardiomegaly without pericardial effusion. Hepatobiliary: Stable hepatic cysts. Otherwise unremarkable unenhanced appearance of the liver. Gallbladder is decompressed, with no evidence of cholelithiasis or cholecystitis. Pancreas: Unremarkable unenhanced appearance. Spleen: Unremarkable unenhanced appearance. Adrenals/Urinary Tract: Bilateral nonobstructing renal calculi are again identified, largest in the left kidney measuring 14 mm. Bilateral simple appearing renal cysts are again noted, which do not require specific imaging follow-up. The adrenals are unremarkable. The bladder is decompressed, limiting its evaluation. Stomach/Bowel: Diffusely  dilated small bowel loops measuring up to 3.9 cm, with multiple gas fluid levels, consistent with small-bowel obstruction. Transition point may lie within the left lower quadrant at the site of a chronic left inguinal hernia, which appears to contain a small portion of mid to distal jejunum. Multiple decompressed loops of the jejunum and ileum are seen within the lower pelvis. Diverticulosis of the sigmoid colon without evidence of diverticulitis. No bowel wall thickening or inflammatory change. Vascular/Lymphatic: Aortic atherosclerosis. No enlarged abdominal or pelvic lymph nodes. Reproductive: Uterus is atrophic, with calcified uterine fibroids noted. No adnexal masses. Other: Trace lower pelvic ascites.  No free intraperitoneal gas. Left inguinal hernia is identified, with a small knuckle of bowel extending into the hernia sac consistent with obstruction. No evidence of bowel wall thickening to suggest ischemia. Small amount of ascites within the hernia sac. Musculoskeletal: No acute displaced fractures. Partial visualization of a subcutaneous fluid collection lateral to the left hip, measuring at least 5.7 x 5.2 cm. Please correlate with any history of prior trauma as this could reflect sequela of subcutaneous hematoma. Reconstructed images demonstrate no additional findings. IMPRESSION: 1. High-grade small bowel obstruction, with transition point at the site of a left inguinal hernia containing a small portion of mid to distal jejunum. Surgical consultation recommended. 2. Trace ascites. 3. Sigmoid diverticulosis without diverticulitis. 4. Indeterminate subcutaneous fluid collection overlying the left hip, please correlate with any history of trauma as this could reflect sequela of prior hematoma. 5. Bilateral nonobstructing renal calculi.  No obstructive uropathy. 6.  Aortic Atherosclerosis (ICD10-I70.0). Electronically Signed   By: Sharlet Salina M.D.   On: 07/18/2023 18:56    EKG: Independently reviewed.   Sinus rhythm, rate 70, QTc 449.  No significant change from prior.  Assessment/Plan Principal Problem:   Incarcerated left inguinal hernia Active Problems:   Acute kidney injury superimposed on chronic kidney disease (HCC)  Hypoglycemia   Hyponatremia   Diabetes (HCC)   CKD stage 4 due to type 2 diabetes mellitus (HCC)   CHF (congestive heart failure) (HCC)   Essential hypertension  Assessment and Plan: * Incarcerated left inguinal hernia Abdominal pain, poor oral intake for about 2 weeks.  EDP was able to partially reduce hernia in ED.  Abdomen tense and distended. - CT abdomen and pelvis-high-grade small bowel obstruction transition points left inguinal hernia containing a small portion of mid to distal jejunum. - Gen surgeon Dr. Henreitta Leber consulted- NG tube, hold Farxiga, hernia repair pending if incarceration reduced further or not. -IV Fentanyl 25mg  Q4hr prn -N.p.o. -Per ED nurse, NG tube insertion attempted, Xray confirmed that NG tube had kink in tubing causing end of tubing curl up-attempted pulling back on NG tube from 70 cm back to 60 cm and xray show kink still present.  Attempted 3 times. -Will hold off on NG tube for tonight, consider reattempting in a.m. - 500cc bolus given, continue D5 N/s 75cc/hr x 1 day  Hypoglycemia Sugar down to 50 due to poor oral intake over the past 2 weeks. -D5 fluids -CBG every 8 hourly  Acute kidney injury superimposed on chronic kidney disease (HCC) Creatinine elevated at 4.56, baseline about 2.6.  CKD 4. -Hold Lasix,  Hyponatremia Sodium 127.  Close to baseline 125-131. - Hydrate  CHF (congestive heart failure) (HCC) Diastolic CHF.  Stable and compensated.  Last echo 04/2023 EF of 65 to 70% grade 2 DD. -Lasix with significant AKI on CKD, allow for hydration and with n.p.o. status  Diabetes (HCC) - HgbA1c -Hold home Beaulah Corin   Essential hypertension Systolic 150s to 409W. -Hold spironolactone, nifedipine, carvedilol,  Lasix, hydralazine 100 mg -As needed hydralazine 5 mg for systolic greater than 170   DVT prophylaxis: SCDS Code Status: FULL code-confirmed with patient at bedside.  She wants resuscitation efforts attempted once. Family Communication: None at bedside Disposition Plan: > 2 days Consults called: Dr. Henreitta Leber Admission status: Inpt med surg I certify that at the point of admission it is my clinical judgment that the patient will require inpatient hospital care spanning beyond 2 midnights from the point of admission due to high intensity of service, high risk for further deterioration and high frequency of surveillance required.    Author: Onnie Boer, MD 07/18/2023 9:42 PM  For on call review [email or web address].

## 2023-07-18 NOTE — ED Notes (Signed)
ED Provider at bedside.Dr Charm Barges at bedside attempting to push hernia back in.

## 2023-07-18 NOTE — ED Notes (Addendum)
1st attempt NG tube placement left nare- unsuccessful 2nd attempt - unsuccessful, 3rd attempt right nare- successful placement but unable to aspirate gastric content- will place xray to confirm correct placement. Pt O2 sats are 97% on room air. NAD.

## 2023-07-18 NOTE — ED Notes (Addendum)
Xray confirmed that NG tube had kink in tubing causing end of tubing curl up-attempted pulling back on NG tube from 70 cm back to 60 cm and xray show kink still present-  NG tube removed at this time. Dr Mariea Clonts made aware- says ok, plan to try again in morning

## 2023-07-18 NOTE — ED Notes (Signed)
XR called.

## 2023-07-18 NOTE — Assessment & Plan Note (Addendum)
Abdominal pain, poor oral intake for about 2 weeks.  EDP was able to partially reduce hernia in ED.  Abdomen tense and distended. - CT abdomen and pelvis-high-grade small bowel obstruction transition points left inguinal hernia containing a small portion of mid to distal jejunum. - Gen surgeon Dr. Henreitta Leber consulted- NG tube, hold Farxiga, hernia repair pending if incarceration reduced further or not. -IV Fentanyl 25mg  Q4hr prn -N.p.o. -Per ED nurse, NG tube insertion attempted, Xray confirmed that NG tube had kink in tubing causing end of tubing curl up-attempted pulling back on NG tube from 70 cm back to 60 cm and xray show kink still present.  Attempted 3 times. -Will hold off on NG tube for tonight, consider reattempting in a.m. - 500cc bolus given, continue D5 N/s 75cc/hr x 1 day

## 2023-07-18 NOTE — Assessment & Plan Note (Signed)
Diastolic CHF.  Stable and compensated.  Last echo 04/2023 EF of 65 to 70% grade 2 DD. -Lasix with significant AKI on CKD, allow for hydration and with n.p.o. status

## 2023-07-18 NOTE — Assessment & Plan Note (Signed)
-   HgbA1c -Hold home Emily Jennings

## 2023-07-18 NOTE — ED Provider Notes (Signed)
[place] EMERGENCY DEPARTMENT AT [facility] Provider Note   CSN: [id number] Arrival date & time: 07/18/23  1542     History {Add pertinent medical, surgical, social history, OB history to HPI:1} Chief Complaint  Patient presents with   Abdominal Pain    Emily Jennings is a 89 y.o. female.  She is brought in by ambulance from home.  She was less responsive and her blood sugar was low, given D10.  She said her stomach has been distended and she has had nausea and vomiting for the last few days.  She has also had a nonproductive cough.  No fevers or chills.  She also said she has not moved her bowels for a few days.  She denies any urine symptoms.  She also has a mass on her left hip that her family says has been there for over a month since a fall.  She has been in and out of the hospital for the last few months.  Recent stroke.  The history is provided by the patient and a relative.  Emesis Severity:  Moderate Duration:  3 days Timing:  Intermittent Quality:  Stomach contents Relieved by:  None tried Worsened by:  Nothing Ineffective treatments:  None tried Associated symptoms: cough   Associated symptoms: no abdominal pain, no diarrhea and no fever        Home Medications Prior to Admission medications   Medication Sig Start Date End Date Taking? Authorizing Provider  acetaminophen (TYLENOL) 325 MG tablet Take 2 tablets (650 mg total) by mouth every 6 (six) hours as needed for mild pain (pain score 1-3) (or Fever >/= 101). 07/06/23   Shon Hale, MD  albuterol (VENTOLIN HFA) 108 (90 Base) MCG/ACT inhaler Inhale 2 puffs into the lungs every 2 (two) hours as needed for wheezing or shortness of breath. 07/16/23   Del Nigel Berthold, FNP  calcitRIOL (ROCALTROL) 0.25 MCG capsule Take 0.25 mcg by mouth 3 (three) times a week.    [provider]  carvedilol (COREG) 25 MG tablet Take 1 tablet (25 mg total) by mouth 2 (two) times daily with a meal. 05/28/23    Danford, Earl Lites, MD  FARXIGA 10 MG TABS tablet Take 10 mg by mouth every morning. 07/01/23   [provider]  furosemide (LASIX) 40 MG tablet Take 1 tablet (40 mg total) by mouth 2 (two) times daily. 07/06/23 07/05/24  Shon Hale, MD  hydrALAZINE (APRESOLINE) 100 MG tablet Take 1 tablet (100 mg total) by mouth 3 (three) times daily. 07/06/23   Emokpae, Courage, MD  insulin degludec (TRESIBA FLEXTOUCH) 100 UNIT/ML FlexTouch Pen Inject 5 Units into the skin daily. 05/01/23   Shamleffer, Konrad Dolores, MD  insulin lispro (HUMALOG KWIKPEN) 100 UNIT/ML KwikPen Inject 5 Units into the skin 3 (three) times daily. Max daily 30 units 05/01/23   Shamleffer, Konrad Dolores, MD  NIFEdipine (ADALAT CC) 60 MG 24 hr tablet Take 1 tablet (60 mg total) by mouth daily. 07/06/23   Shon Hale, MD  pantoprazole (PROTONIX) 40 MG tablet Take 1 tablet (40 mg total) by mouth daily. 07/06/23 07/05/24  Shon Hale, MD  potassium chloride (KLOR-CON) 10 MEQ tablet Take 1 tablet (10 mEq total) by mouth daily. Take While taking Lasix/furosemide 07/06/23   Emokpae, Courage, MD  rosuvastatin (CRESTOR) 5 MG tablet TAKE 1 TABLET (5 MG TOTAL) BY MOUTH DAILY. Patient not taking: Reported on 07/16/2023 01/15/23   [name], MD  spironolactone (ALDACTONE) 50 MG tablet  Take 1 tablet (50 mg total) by mouth daily. 07/06/23   Shon Hale, MD      Allergies    Amlodipine, Benadryl [diphenhydramine hcl], Citalopram, Farxiga [dapagliflozin], Metformin and related, Tramadol, and Crestor [rosuvastatin]    Review of Systems   Review of Systems  Constitutional:  Negative for fever.  Respiratory:  Positive for cough.   Gastrointestinal:  Positive for constipation and vomiting. Negative for abdominal pain and diarrhea.  Genitourinary:  Negative for dysuria.    Physical Exam Updated Vital Signs BP (!) 151/67   Pulse 67   Temp 97.6 F (36.4 C) (Oral)   Resp 18   Ht 5\' 1"  (1.549 m)   Wt  46.3 kg   SpO2 97%   BMI 19.29 kg/m  Physical Exam Vitals and nursing note reviewed.  Constitutional:      General: She is not in acute distress.    Appearance: She is well-developed.  HENT:     Head: Normocephalic and atraumatic.  Eyes:     Conjunctiva/sclera: Conjunctivae normal.  Cardiovascular:     Rate and Rhythm: Normal rate and regular rhythm.     Heart sounds: No murmur heard. Pulmonary:     Effort: Pulmonary effort is normal. No respiratory distress.     Breath sounds: Normal breath sounds.  Abdominal:     General: There is distension.     Palpations: Abdomen is soft.     Tenderness: There is no abdominal tenderness. There is no guarding or rebound.  Musculoskeletal:     Cervical back: Neck supple.     Comments: She has a soft tissue mass just lateral to her left hip, feels like a lipoma  Skin:    General: Skin is warm and dry.     Capillary Refill: Capillary refill takes less than 2 seconds.  Neurological:     General: No focal deficit present.     Mental Status: She is alert. She is disoriented.     ED Results / Procedures / Treatments   Labs (all labs ordered are listed, but only abnormal results are displayed) Labs Reviewed  CBG MONITORING, ED - Abnormal; Notable for the following components:      Result Value   Glucose-Capillary 153 (*)    All other components within normal limits  SARS CORONAVIRUS 2 BY RT PCR  COMPREHENSIVE METABOLIC PANEL  LIPASE, BLOOD  CBC WITH DIFFERENTIAL/PLATELET  URINALYSIS, ROUTINE W REFLEX MICROSCOPIC  MAGNESIUM  BRAIN NATRIURETIC PEPTIDE  TROPONIN I (HIGH SENSITIVITY)    EKG None  Radiology No results found.  Procedures Procedures  {Document cardiac monitor, telemetry assessment procedure when appropriate:1}  Medications Ordered in ED Medications  sodium chloride 0.9 % bolus 500 mL (has no administration in time range)  ondansetron (ZOFRAN) injection 4 mg (has no administration in time range)  pantoprazole  (PROTONIX) injection 40 mg (has no administration in time range)    ED Course/ Medical Decision Making/ A&P   {   Click here for ABCD2, HEART and other calculatorsREFRESH Note before signing :1}                              Medical Decision Making Amount and/or Complexity of Data Reviewed Labs: ordered. Radiology: ordered.  Risk Prescription drug management.   This patient complains of ***; this involves an extensive number of treatment Options and is a complaint that carries with it a high risk of complications and morbidity. The  differential includes ***  I ordered, reviewed and interpreted labs, which included *** I ordered medication *** and reviewed PMP when indicated. I ordered imaging studies which included *** and I independently    visualized and interpreted imaging which showed *** Additional history obtained from *** Previous records obtained and reviewed *** I consulted *** and discussed lab and imaging findings and discussed disposition.  Cardiac monitoring reviewed, *** Social determinants considered, *** Critical Interventions: ***  After the interventions stated above, I reevaluated the patient and found *** Admission and further testing considered, ***   {Document critical care time when appropriate:1} {Document review of labs and clinical decision tools ie heart score, Chads2Vasc2 etc:1}  {Document your independent review of radiology images, and any outside records:1} {Document your discussion with family members, caretakers, and with consultants:1} {Document social determinants of health affecting pt's care:1} {Document your decision making why or why not admission, treatments were needed:1} Final Clinical Impression(s) / ED Diagnoses Final diagnoses:  None    Rx / DC Orders ED Discharge Orders     None

## 2023-07-18 NOTE — ED Notes (Signed)
XR ordered for NGT placement

## 2023-07-18 NOTE — ED Notes (Signed)
Xray at bedside to confirm NG placement.

## 2023-07-18 NOTE — ED Notes (Signed)
Emily Jennings, [facility] aware of need for IV fluids from upstairs pharmacy

## 2023-07-18 NOTE — ED Notes (Signed)
Pt spouse at bedside- updated on plan of care for pt

## 2023-07-18 NOTE — ED Notes (Signed)
Dr. Denton Brick at bedside to assess pt.

## 2023-07-18 NOTE — Assessment & Plan Note (Signed)
Creatinine elevated at 4.56, baseline about 2.6.  CKD 4. -Hold Lasix,

## 2023-07-18 NOTE — Assessment & Plan Note (Signed)
Sodium 127.  Close to baseline 125-131. - Hydrate

## 2023-07-18 NOTE — Progress Notes (Signed)
[facility]  Incarcerated left inguinal hernia, with SBO partially reduced in ED.  Ng [address] [employer]. Hospitalist admit  Likely hernia repair tomorrow pending if incarceration reduced further or not.  Algis Greenhouse, MD

## 2023-07-19 ENCOUNTER — Encounter (HOSPITAL_COMMUNITY)
Admission: EM | Disposition: A | Discharge status: Home / Self Care | Payer: Self-pay | Source: Home / Self Care | Attending: Family Medicine

## 2023-07-19 ENCOUNTER — Inpatient Hospital Stay (HOSPITAL_COMMUNITY): Payer: Medicare Other

## 2023-07-19 ENCOUNTER — Other Ambulatory Visit: Payer: Self-pay

## 2023-07-19 ENCOUNTER — Ambulatory Visit (INDEPENDENT_AMBULATORY_CARE_PROVIDER_SITE_OTHER): Payer: Medicare Other | Admitting: Gastroenterology

## 2023-07-19 ENCOUNTER — Inpatient Hospital Stay (HOSPITAL_COMMUNITY): Payer: Medicare Other | Admitting: Anesthesiology

## 2023-07-19 ENCOUNTER — Other Ambulatory Visit: Payer: Self-pay | Admitting: Family Medicine

## 2023-07-19 ENCOUNTER — Encounter (HOSPITAL_COMMUNITY): Payer: Self-pay | Admitting: Internal Medicine

## 2023-07-19 DIAGNOSIS — I2510 Atherosclerotic heart disease of native coronary artery without angina pectoris: Secondary | ICD-10-CM

## 2023-07-19 DIAGNOSIS — K559 Vascular disorder of intestine, unspecified: Secondary | ICD-10-CM

## 2023-07-19 DIAGNOSIS — K4130 Unilateral femoral hernia, with obstruction, without gangrene, not specified as recurrent: Secondary | ICD-10-CM | POA: Diagnosis not present

## 2023-07-19 DIAGNOSIS — K4030 Unilateral inguinal hernia, with obstruction, without gangrene, not specified as recurrent: Secondary | ICD-10-CM | POA: Diagnosis not present

## 2023-07-19 DIAGNOSIS — K56609 Unspecified intestinal obstruction, unspecified as to partial versus complete obstruction: Secondary | ICD-10-CM | POA: Diagnosis present

## 2023-07-19 HISTORY — PX: BOWEL RESECTION: SHX1257

## 2023-07-19 HISTORY — PX: INGUINAL HERNIA REPAIR: SHX194

## 2023-07-19 LAB — GLUCOSE, CAPILLARY
Glucose-Capillary: 239 mg/dL — ABNORMAL HIGH (ref 70–99)
Glucose-Capillary: 219 mg/dL — ABNORMAL HIGH (ref 70–99)
Glucose-Capillary: 204 mg/dL — ABNORMAL HIGH (ref 70–99)

## 2023-07-19 LAB — BASIC METABOLIC PANEL
Anion gap: 18 — ABNORMAL HIGH (ref 5–15)
BUN: 82 mg/dL — ABNORMAL HIGH (ref 8–23)
CO2: 23 mmol/L (ref 22–32)
Calcium: 8.2 mg/dL — ABNORMAL LOW (ref 8.9–10.3)
Chloride: 81 mmol/L — ABNORMAL LOW (ref 98–111)
Creatinine, Ser: 4.51 mg/dL — ABNORMAL HIGH (ref 0.44–1.00)
GFR, Estimated: 9 mL/min — ABNORMAL LOW (ref >60)
Glucose, Bld: 317 mg/dL — ABNORMAL HIGH (ref 70–99)
Potassium: 3.8 mmol/L (ref 3.5–5.1)
Sodium: 122 mmol/L — ABNORMAL LOW (ref 135–145)

## 2023-07-19 LAB — CBC
HCT: 28.2 % — ABNORMAL LOW (ref 36.0–46.0)
Hemoglobin: 9.1 g/dL — ABNORMAL LOW (ref 12.0–15.0)
MCH: 29.9 pg (ref 26.0–34.0)
MCHC: 32.3 g/dL (ref 30.0–36.0)
MCV: 92.8 fL (ref 80.0–100.0)
Platelets: 236 10*3/uL (ref 150–400)
RBC: 3.04 MIL/uL — ABNORMAL LOW (ref 3.87–5.11)
RDW: 13.1 % (ref 11.5–15.5)
WBC: 6.4 10*3/uL (ref 4.0–10.5)
nRBC: 0 % (ref 0.0–0.2)

## 2023-07-19 LAB — CBG MONITORING, ED
Glucose-Capillary: 295 mg/dL — ABNORMAL HIGH (ref 70–99)
Glucose-Capillary: 294 mg/dL — ABNORMAL HIGH (ref 70–99)
Glucose-Capillary: 326 mg/dL — ABNORMAL HIGH (ref 70–99)
Glucose-Capillary: 309 mg/dL — ABNORMAL HIGH (ref 70–99)
Glucose-Capillary: 358 mg/dL — ABNORMAL HIGH (ref 70–99)
Glucose-Capillary: 298 mg/dL — ABNORMAL HIGH (ref 70–99)

## 2023-07-19 LAB — MRSA NEXT GEN BY PCR, NASAL: MRSA by PCR Next Gen: NOT DETECTED

## 2023-07-19 SURGERY — REPAIR, HERNIA, INGUINAL, INCARCERATED
Anesthesia: General | Site: Groin | Laterality: Left

## 2023-07-19 MED ORDER — ONDANSETRON HCL 4 MG PO TABS
4.0000 mg | ORAL_TABLET | Freq: Four times a day (QID) | ORAL | Status: DC | PRN
Start: 2023-07-19 — End: 2023-07-25

## 2023-07-19 MED ORDER — CHLORHEXIDINE GLUCONATE 0.12 % MT SOLN
15.0000 mL | Freq: Once | OROMUCOSAL | Status: AC
  Administered 2023-07-19: 15 mL via OROMUCOSAL

## 2023-07-19 MED ORDER — CEFAZOLIN SODIUM-DEXTROSE 2-4 GM/100ML-% IV SOLN
2.0000 g | INTRAVENOUS | Status: AC
  Administered 2023-07-19: 2 g via INTRAVENOUS
  Filled 2023-07-19: qty 100

## 2023-07-19 MED ORDER — INSULIN ASPART 100 UNIT/ML IJ SOLN
0.0000 [IU] | Freq: Every day | INTRAMUSCULAR | Status: DC
  Administered 2023-07-19: 2 [IU] via SUBCUTANEOUS

## 2023-07-19 MED ORDER — ROCURONIUM BROMIDE 10 MG/ML (PF) SYRINGE
PREFILLED_SYRINGE | INTRAVENOUS | Status: AC
  Filled 2023-07-19: qty 10

## 2023-07-19 MED ORDER — ACETAMINOPHEN 325 MG PO TABS
650.0000 mg | ORAL_TABLET | Freq: Four times a day (QID) | ORAL | Status: DC | PRN
Start: 2023-07-19 — End: 2023-07-25

## 2023-07-19 MED ORDER — PROPOFOL 10 MG/ML IV BOLUS
INTRAVENOUS | Status: DC | PRN
  Administered 2023-07-19: 70 mg via INTRAVENOUS

## 2023-07-19 MED ORDER — HYDRALAZINE HCL 20 MG/ML IJ SOLN
10.0000 mg | Freq: Four times a day (QID) | INTRAMUSCULAR | Status: DC | PRN
  Administered 2023-07-19 – 2023-07-22 (×4): 10 mg via INTRAVENOUS
  Filled 2023-07-19 (×4): qty 1

## 2023-07-19 MED ORDER — PHENYLEPHRINE HCL-NACL 20-0.9 MG/250ML-% IV SOLN
INTRAVENOUS | Status: DC | PRN
  Administered 2023-07-19: 50 ug/min via INTRAVENOUS

## 2023-07-19 MED ORDER — ACETAMINOPHEN 325 MG PO TABS: 650.0000 mg | ORAL_TABLET | Freq: Four times a day (QID) | ORAL | Status: DC | PRN

## 2023-07-19 MED ORDER — ONDANSETRON HCL 4 MG PO TABS: 4.0000 mg | ORAL_TABLET | Freq: Four times a day (QID) | ORAL | Status: DC | PRN

## 2023-07-19 MED ORDER — ACETAMINOPHEN 650 MG RE SUPP
650.0000 mg | Freq: Four times a day (QID) | RECTAL | Status: DC | PRN
Start: 2023-07-19 — End: 2023-07-25

## 2023-07-19 MED ORDER — MORPHINE SULFATE (PF) 2 MG/ML IV SOLN
2.0000 mg | INTRAVENOUS | Status: DC | PRN
  Administered 2023-07-24: 2 mg via INTRAVENOUS
  Filled 2023-07-19: qty 1

## 2023-07-19 MED ORDER — ONDANSETRON HCL 4 MG/2ML IJ SOLN
INTRAMUSCULAR | Status: DC | PRN
  Administered 2023-07-19: 4 mg via INTRAVENOUS

## 2023-07-19 MED ORDER — ONDANSETRON HCL 4 MG/2ML IJ SOLN
INTRAMUSCULAR | Status: AC
  Filled 2023-07-19: qty 2

## 2023-07-19 MED ORDER — CHLORHEXIDINE GLUCONATE CLOTH 2 % EX PADS
6.0000 | MEDICATED_PAD | Freq: Once | CUTANEOUS | Status: AC
Start: 2023-07-19 — End: 2023-07-19
  Administered 2023-07-19: 6 via TOPICAL

## 2023-07-19 MED ORDER — PHENYLEPHRINE HCL-NACL 20-0.9 MG/250ML-% IV SOLN
INTRAVENOUS | Status: AC
  Filled 2023-07-19: qty 250

## 2023-07-19 MED ORDER — SUGAMMADEX SODIUM 500 MG/5ML IV SOLN
INTRAVENOUS | Status: DC | PRN
  Administered 2023-07-19: 200 mg via INTRAVENOUS

## 2023-07-19 MED ORDER — HEPARIN SODIUM (PORCINE) 5000 UNIT/ML IJ SOLN
5000.0000 [IU] | Freq: Three times a day (TID) | INTRAMUSCULAR | Status: DC
  Administered 2023-07-20 – 2023-07-25 (×15): 5000 [IU] via SUBCUTANEOUS
  Filled 2023-07-19 (×16): qty 1

## 2023-07-19 MED ORDER — FENTANYL CITRATE (PF) 100 MCG/2ML IJ SOLN
INTRAMUSCULAR | Status: DC | PRN
  Administered 2023-07-19 (×4): 25 ug via INTRAVENOUS

## 2023-07-19 MED ORDER — LACTATED RINGERS IV SOLN: INTRAVENOUS | Status: DC | PRN

## 2023-07-19 MED ORDER — HYDRALAZINE HCL 20 MG/ML IJ SOLN
5.0000 mg | INTRAMUSCULAR | Status: DC | PRN
  Administered 2023-07-19: 5 mg via INTRAVENOUS
  Filled 2023-07-19: qty 1

## 2023-07-19 MED ORDER — SUCCINYLCHOLINE CHLORIDE 200 MG/10ML IV SOSY
PREFILLED_SYRINGE | INTRAVENOUS | Status: AC
  Filled 2023-07-19: qty 10

## 2023-07-19 MED ORDER — FENTANYL CITRATE (PF) 100 MCG/2ML IJ SOLN
INTRAMUSCULAR | Status: AC
  Filled 2023-07-19: qty 2

## 2023-07-19 MED ORDER — SODIUM CHLORIDE 0.9 % IR SOLN
Status: DC | PRN
  Administered 2023-07-19: 1000 mL

## 2023-07-19 MED ORDER — ROCURONIUM BROMIDE 100 MG/10ML IV SOLN
INTRAVENOUS | Status: DC | PRN
  Administered 2023-07-19: 50 mg via INTRAVENOUS

## 2023-07-19 MED ORDER — INSULIN ASPART 100 UNIT/ML IJ SOLN
0.0000 [IU] | Freq: Three times a day (TID) | INTRAMUSCULAR | Status: DC
  Administered 2023-07-19: 3 [IU] via SUBCUTANEOUS
  Administered 2023-07-19: 9 [IU] via SUBCUTANEOUS
  Administered 2023-07-20: 2 [IU] via SUBCUTANEOUS
  Administered 2023-07-20: 5 [IU] via SUBCUTANEOUS
  Administered 2023-07-20: 9 [IU] via SUBCUTANEOUS
  Administered 2023-07-21: 3 [IU] via SUBCUTANEOUS
  Filled 2023-07-19: qty 1

## 2023-07-19 MED ORDER — ONDANSETRON HCL 4 MG/2ML IJ SOLN: 4.0000 mg | Freq: Four times a day (QID) | INTRAMUSCULAR | Status: DC | PRN

## 2023-07-19 MED ORDER — SUCCINYLCHOLINE CHLORIDE 200 MG/10ML IV SOSY
PREFILLED_SYRINGE | INTRAVENOUS | Status: DC | PRN
  Administered 2023-07-19: 100 mg via INTRAVENOUS

## 2023-07-19 MED ORDER — FAMOTIDINE IN NACL 20-0.9 MG/50ML-% IV SOLN
20.0000 mg | INTRAVENOUS | Status: DC
  Administered 2023-07-19 – 2023-07-25 (×6): 20 mg via INTRAVENOUS
  Filled 2023-07-19 (×6): qty 50

## 2023-07-19 MED ORDER — CHLORHEXIDINE GLUCONATE CLOTH 2 % EX PADS
6.0000 | MEDICATED_PAD | Freq: Every day | CUTANEOUS | Status: DC
  Administered 2023-07-19 – 2023-07-20 (×2): 6 via TOPICAL

## 2023-07-19 MED ORDER — BUPIVACAINE HCL (PF) 0.5 % IJ SOLN
INTRAMUSCULAR | Status: DC | PRN
  Administered 2023-07-19: 30 mL

## 2023-07-19 MED ORDER — ACETAMINOPHEN 650 MG RE SUPP
650.0000 mg | Freq: Four times a day (QID) | RECTAL | Status: DC | PRN
Start: 2023-07-19 — End: 2023-07-19

## 2023-07-19 MED ORDER — PROPOFOL 10 MG/ML IV BOLUS
INTRAVENOUS | Status: AC
  Filled 2023-07-19: qty 20

## 2023-07-19 MED ORDER — BUPIVACAINE HCL (PF) 0.5 % IJ SOLN
INTRAMUSCULAR | Status: AC
  Filled 2023-07-19: qty 30

## 2023-07-19 SURGICAL SUPPLY — 39 items
ADH SKN CLS APL DERMABOND .7 (GAUZE/BANDAGES/DRESSINGS) ×1
CLOTH BEACON ORANGE TIMEOUT ST (SAFETY) ×1 IMPLANT
COVER LIGHT HANDLE STERIS (MISCELLANEOUS) ×2 IMPLANT
DERMABOND ADVANCED .7 DNX12 (GAUZE/BANDAGES/DRESSINGS) ×1 IMPLANT
DRAIN PENROSE 0.5X18 (DRAIN) ×1 IMPLANT
DRSG OPSITE POSTOP 4X8 (GAUZE/BANDAGES/DRESSINGS) IMPLANT
ELECT REM PT RETURN 9FT ADLT (ELECTROSURGICAL) ×1
ELECTRODE REM PT RTRN 9FT ADLT (ELECTROSURGICAL) ×1 IMPLANT
GAUZE SPONGE 4X4 12PLY STRL (GAUZE/BANDAGES/DRESSINGS) ×1 IMPLANT
GLOVE BIO SURGEON STRL SZ 6.5 (GLOVE) ×1 IMPLANT
GLOVE BIOGEL PI IND STRL 6.5 (GLOVE) ×1 IMPLANT
GLOVE BIOGEL PI IND STRL 7.0 (GLOVE) ×2 IMPLANT
GOWN STRL REUS W/TWL LRG LVL3 (GOWN DISPOSABLE) ×3 IMPLANT
KIT TURNOVER KIT A (KITS) ×1 IMPLANT
LIGASURE IMPACT 36 18CM CVD LR (INSTRUMENTS) IMPLANT
MANIFOLD NEPTUNE II (INSTRUMENTS) ×1 IMPLANT
NDL HYPO 21X1.5 SAFETY (NEEDLE) IMPLANT
NEEDLE HYPO 21X1.5 SAFETY (NEEDLE) ×1 IMPLANT
NS IRRIG 1000ML POUR BTL (IV SOLUTION) ×1 IMPLANT
PACK MINOR (CUSTOM PROCEDURE TRAY) ×1 IMPLANT
PAD ARMBOARD 7.5X6 YLW CONV (MISCELLANEOUS) ×1 IMPLANT
POSITIONER HEAD 8X9X4 ADT (SOFTGOODS) ×1 IMPLANT
RELOAD PROXIMATE 75MM BLUE (ENDOMECHANICALS) ×2 IMPLANT
RELOAD STAPLE 75 3.8 BLU REG (ENDOMECHANICALS) IMPLANT
SET BASIN LINEN APH (SET/KITS/TRAYS/PACK) ×1 IMPLANT
SOL PREP PROV IODINE SCRUB 4OZ (MISCELLANEOUS) ×1 IMPLANT
SPONGE T-LAP 4X18 ~~LOC~~+RFID (SPONGE) IMPLANT
STAPLER GUN LINEAR PROX 60 (STAPLE) IMPLANT
STAPLER PROXIMATE 75MM BLUE (STAPLE) IMPLANT
STAPLER VISISTAT (STAPLE) IMPLANT
SUT CHROMIC 3 0 SH 27 (SUTURE) IMPLANT
SUT MNCRL AB 4-0 PS2 18 (SUTURE) ×1 IMPLANT
SUT NOVA NAB GS-22 2 2-0 T-19 (SUTURE) IMPLANT
SUT SILK 3 0 SH CR/8 (SUTURE) IMPLANT
SUT VIC AB 2-0 CT1 27 (SUTURE) ×1
SUT VIC AB 2-0 CT1 TAPERPNT 27 (SUTURE) ×1 IMPLANT
SUT VIC AB 3-0 SH 27 (SUTURE) ×2
SUT VIC AB 3-0 SH 27X BRD (SUTURE) ×1 IMPLANT
SYR 30ML LL (SYRINGE) ×1 IMPLANT

## 2023-07-19 NOTE — Anesthesia Procedure Notes (Signed)
Procedure Name: Intubation Date/Time: 07/19/2023 2:12 PM  Performed by: Shanon Payor, CRNAPre-anesthesia Checklist: Patient identified, Emergency Drugs available, Suction available, Patient being monitored and Timeout performed Patient Re-evaluated:Patient Re-evaluated prior to induction Oxygen Delivery Method: Circle system utilized Preoxygenation: Pre-oxygenation with 100% oxygen Induction Type: Rapid sequence and Cricoid Pressure applied Grade View: Grade I Tube type: Oral Tube size: 6.5 mm Number of attempts: 1 Airway Equipment and Method: Stylet Placement Confirmation: positive ETCO2, ETT inserted through vocal cords under direct vision, CO2 detector and breath sounds checked- equal and bilateral Secured at: 22 cm Tube secured with: Tape Dental Injury: Teeth and Oropharynx as per pre-operative assessment

## 2023-07-19 NOTE — Progress Notes (Addendum)
[facility]  Came to see patient. No NG in place. This was attempted last night but then aborted after one try it sounds like for the NG was kinking back up. I do not see that a new tube was tried. She really needed this NG to help with decompression of the bowel to see if the hernia could reduce further.  Still with some incarcerated hernia. It is difficult to say how much was reduced but it does feel like some of it reduces.   NG tube needs to be placed STAT this AM.  I will be back to see if that can be reduced further once the NG is in place. She has recent admission for PNA with some CHF exacerbation, Na down trending. Surgery would be safer if she could be optimized but it can only be optimized further if the hernia can be reduced.   Updated team. Spoke with ED.   Algis Greenhouse, MD [facility] [address] [place], [place] [address] [phone] (office)

## 2023-07-19 NOTE — Progress Notes (Signed)
[facility]  Spoke with Radiology, continued bowel in the hernia, obstruction, pneumatosis, or free air, plan for repair. Reiterated to patient and family she could get much sicker, she could remain intubated and may require ICU admission, transfer to [facility]. Asked Dr. Radiology if this could be a femoral based on my review of the imaging on the coronal, he thinks that is possible femoral too.   OR now.  Algis Greenhouse, MD [facility] [address] [place], [place] [address] [phone] (office)

## 2023-07-19 NOTE — ED Notes (Signed)
Pts s/o came up to nurses station and told this RN that pts blood sugar is high and wants it check, this RN check cbg and it was 309. Pt does have a Hx of T2D and takes insulin at home for blood sugar control--MD made aware

## 2023-07-19 NOTE — Progress Notes (Signed)
PROGRESS NOTE  Emily Jennings, is a 89 y.o. female, DOB - 10/09/1933, ZOX:[id number]  Admit date - 07/18/2023   Admitting Physician Oletha Tolson Mariea Clonts, MD  Outpatient Primary MD for the patient is [name], MD  LOS - 1  Chief Complaint  Patient presents with   Abdominal Pain        Brief Narrative:  89 yo with past medical history relevant for CKD 4, diastolic CHF, CAD, DM2, , GERD, and HTN-admitted on 07/18/2023 with small bowel obstruction in the setting of incarcerated left inguinal hernia -Status post primary repair of femoral inguinal hernia, small bowel resection and primary side-to-side anastomosis on 07/19/2023    -Assessment and Plan: 1)Incarcerated left inguinal hernia -Clinically and radiologically patient had incarcerated left inguinal hernia with high-grade small bowel obstruction -General Surgery consult from Dr. Henreitta Leber appreciated patient is status post  primary repair of femoral inguinal hernia, small bowel resection and primary side-to-side anastomosis on 07/19/2023 -NG tube in situ -N.p.o., IV fluids -Further management per general surgery team  2)Acute kidney injury superimposed on chronic kidney disease IV Baseline creatinine usually between 2.6-3  -creatinine elevated at 4.56 on admission,  -Hold diuretics -Hydrate IV while n.p.o.  3) acute on chronic hyponatremia - Close to baseline 125-131. - Hydrate  4)Diastolic dysfunction CHF (congestive heart failure) (HCC) -Stable and compensated.  Last echo 04/2023 EF of 65 to 70% grade 2 DD. GDMT limited by CKD 4,  -Hold Lasix with significant AKI on CKD, allow for hydration and with n.p.o. status  5)Uncontrolled type 2 diabetes mellitus --A1c 7.5 reflecting uncontrolled DM with hyperglycemia PTA  -Hold home Beaulah Corin especially while n.p.o. Use Novolog/Humalog Sliding scale insulin with Accu-Cheks/Fingersticks as ordered   6)HTN -Hold spironolactone, nifedipine, carvedilol, Lasix, hydralazine  100 mg -Okay to use IV hydralazine as needed elevated BP  7)1.7 cm Left Thyroid Nodule---Recommend thyroid US as outpt   8)HLD--- -when oral intake resumes    9)GERD (gastroesophageal reflux disease) - Protonix   10)Chronic anemia--in the setting of CKD 4  Anticipate drop in H&H in the postoperative.   Status is: Inpatient   Disposition: The patient is from: Home              Anticipated d/c is to: Home              Anticipated d/c date is: > 3 days              Patient currently is not medically stable to d/c. Barriers: Not Clinically Stable-   Code Status :  -  Code Status: Full Code   Family Communication:    Discussed with husband at bedside  DVT Prophylaxis  :   - SCDs   SCDs Start: 07/19/23 0142   Lab Results  Component Value Date   PLT 236 07/19/2023    Inpatient Medications  Scheduled Meds:  Chlorhexidine Gluconate Cloth  6 each Topical Daily   insulin aspart  0-5 Units Subcutaneous QHS   insulin aspart  0-9 Units Subcutaneous TID WC   Continuous Infusions:  dextrose 5 % and 0.9 % NaCl 20 mL/hr at 07/19/23 1741   PRN Meds:.acetaminophen **OR** acetaminophen, fentaNYL (SUBLIMAZE) injection, hydrALAZINE, ondansetron **OR** ondansetron (ZOFRAN) IV   Anti-infectives (From admission, onward)    Start     Dose/Rate Route Frequency Ordered Stop   07/19/23 1300  ceFAZolin (ANCEF) IVPB 2g/100 mL premix        2 g 200 mL/hr over 30 Minutes Intravenous On call to  O.R. 07/19/23 1158 07/19/23 1427         Subjective: [name] today has no fevers, no emesis,  No chest pain,   - Resting comfortably postsurgery -Husband at bedside   Objective: Vitals:   07/19/23 1700 07/19/23 1715 07/19/23 1730 07/19/23 1745  BP: (!) 177/63 (!) 157/58 (!) 111/59 (!) 129/41  Pulse: 81 82 81 78  Resp: (!) 21 (!) 25 13 16   Temp:      TempSrc:      SpO2: 100% 100% 100% 100%  Weight: 48.5 kg     Height: 5' (1.524 m)       Intake/Output Summary (Last 24 hours) at  07/19/2023 1829 Last data filed at 07/19/2023 1741 Gross per 24 hour  Intake 2069.26 ml  Output 220 ml  Net 1849.26 ml   Filed Weights   07/18/23 1602 07/19/23 1236 07/19/23 1700  Weight: 46.3 kg 46.3 kg 48.5 kg    Physical Exam  Gen:- Awake Alert, no acute distress HEENT:- [place].AT, No sclera icterus Nose--NG tube in situ Neck-Supple Neck,No JVD,.  Lungs-  CTAB , fair symmetrical air movement CV- S1, S2 normal, regular  Abd-  +ve B.Sounds, Abd Soft, appropriate postoperative tenderness,    Extremity/Skin:- No  edema, pedal pulses present  Psych-affect is appropriate, oriented x3 Neuro-no new focal deficits, no tremors  Data Reviewed: I have personally reviewed following labs and imaging studies  CBC: Recent Labs  Lab 07/16/23 0921 07/18/23 1702 07/19/23 0559  WBC 10.9* 7.6 6.4  NEUTROABS 9.1* 6.2  --   HGB 11.0* 11.3* 9.1*  HCT 34.0 33.3* 28.2*  MCV 95 91.7 92.8  PLT 319 314 236   Basic Metabolic Panel: Recent Labs  Lab 07/16/23 0921 07/18/23 1702 07/19/23 0559  NA 130* 127* 122*  K 4.4 4.4 3.8  CL 83* 79* 81*  CO2 29 32 23  GLUCOSE 124* 86 317*  BUN 54* 77* 82*  CREATININE 3.09* 4.56* 4.51*  CALCIUM 9.9 9.6 8.2*  MG  --  2.5*  --    GFR: Estimated Creatinine Clearance: 6.3 mL/min (A) (by C-G formula based on SCr of 4.51 mg/dL (H)). Liver Function Tests: Recent Labs  Lab 07/18/23 1702  AST 27  ALT 16  ALKPHOS 57  BILITOT 1.0  PROT 7.1  ALBUMIN 3.9   Recent Results (from the past 240 hour(s))  SARS Coronavirus 2 by RT PCR (hospital order, performed in [facility] hospital lab) *cepheid single result test* Anterior Nasal Swab     Status: None   Collection Time: 07/18/23  6:32 PM   Specimen: Anterior Nasal Swab  Result Value Ref Range Status   SARS Coronavirus 2 by RT PCR NEGATIVE NEGATIVE Final    Comment: (NOTE) SARS-CoV-2 target nucleic acids are NOT DETECTED.  The SARS-CoV-2 RNA is generally detectable in upper and lower respiratory  specimens during the acute phase of infection. The lowest concentration of SARS-CoV-2 viral copies this assay can detect is 250 copies / mL. A negative result does not preclude SARS-CoV-2 infection and should not be used as the sole basis for treatment or other patient management decisions.  A negative result may occur with improper specimen collection / handling, submission of specimen other than nasopharyngeal swab, presence of viral mutation(s) within the areas targeted by this assay, and inadequate number of viral copies (<250 copies / mL). A negative result must be combined with clinical observations, patient history, and epidemiological information.  Fact Sheet for Patients:   RoadLapTop.co.za  Fact  Sheet for Healthcare Providers: http://kim-miller.com/  This test is not yet approved or  cleared by the [place] and has been authorized for detection and/or diagnosis of SARS-CoV-2 by FDA under an Emergency Use Authorization (EUA).  This EUA will remain in effect (meaning this test can be used) for the duration of the COVID-19 declaration under Section 564(b)(1) of the Act, 21 U.S.C. section 360bbb-3(b)(1), unless the authorization is terminated or revoked sooner.  Performed at [facility], [address], [place], [place] [address]     Radiology Studies: CT ABDOMEN PELVIS WO CONTRAST  Result Date: 07/19/2023 CLINICAL DATA:  Follow-up small-bowel obstruction. Left inguinal hernia. EXAM: CT ABDOMEN AND PELVIS WITHOUT CONTRAST TECHNIQUE: Multidetector CT imaging of the abdomen and pelvis was performed following the standard protocol without IV contrast. RADIATION DOSE REDUCTION: This exam was performed according to the departmental dose-optimization program which includes automated exposure control, adjustment of the mA and/or kV according to patient size and/or use of iterative reconstruction technique. COMPARISON:  07/18/2023  FINDINGS: Lower chest: No acute findings. Hepatobiliary: Stable small cysts in the right and left lobes. No suspicious mass visualized on this unenhanced exam. Prior cholecystectomy. No evidence of biliary obstruction. Pancreas: No mass or inflammatory process visualized on this unenhanced exam. Spleen:  Within normal limits in size. Adrenals/Urinary tract: Bilateral renal calculi are again seen, with largest measuring 11 mm in the left kidney. No evidence of urolithiasis or hydronephrosis. Unremarkable unopacified urinary bladder. Stomach/Bowel: Small left groin hernia is again seen containing a small bowel loop and some adjacent fluid. This is a transition point with moderate to severe small bowel dilatation and air-fluid levels proximally. This is consistent with a small-bowel obstruction due to left groin hernia, which may represent a femoral hernia rather than inguinal hernia. This shows no significant change compared to prior study. No evidence of pneumatosis or portal venous gas, or free intraperitoneal air. Mild diffuse mesenteric edema and small amount of ascites seen, without evidence of abscess. Diverticulosis is seen mainly involving the descending and sigmoid colon, however there is no evidence of diverticulitis. Vascular/Lymphatic: No pathologically enlarged lymph nodes identified. No evidence of abdominal aortic aneurysm. Reproductive: Several small calcified uterine fibroids again seen. Adnexal regions are unremarkable. Other:  Diffuse mesenteric and body wall edema. Musculoskeletal: No suspicious bone lesions identified. Fluid collection again seen in the subcutaneous tissues along the lateral aspect of the left hip, which measures 5.5 x 4.7 cm. IMPRESSION: No significant change in small-bowel obstruction due to left groin hernia which contains a distal small bowel loop. This may represent a femoral hernia rather than an inguinal hernia. Mild diffuse mesenteric edema and small amount of ascites.  Stable subcutaneous fluid collection along the lateral aspect of the left hip. Colonic diverticulosis, without radiographic evidence of diverticulitis. Bilateral nephrolithiasis. No evidence of urolithiasis or hydronephrosis. Small calcified uterine fibroids. Electronically Signed   By: Danae Orleans M.D.   On: 07/19/2023 13:52   DG Chest Portable 1 View  Result Date: 07/19/2023 CLINICAL DATA:  verification of NG tube placement EXAM: PORTABLE CHEST 1 VIEW COMPARISON:  07/18/2023. FINDINGS: Interval placement of enteric tube with its tip and side hole overlying left upper quadrant, within the proximal stomach. Consider further advancement by 2 inches to confidently put the side-hole within the fundus. Bilateral lung fields are clear. Again noted is elevated right hemidiaphragm. Bilateral costophrenic angles are clear. Normal cardio-mediastinal silhouette. No acute osseous abnormalities. The soft tissues are within normal limits. IMPRESSION: *No acute cardiopulmonary abnormality. *  Interval placement of enteric tube with its tip and side hole overlying left upper quadrant, within the proximal stomach. Consider further advancement by 2 inches to confidently put the side-hole within the fundus. Electronically Signed   By: Jules Schick M.D.   On: 07/19/2023 09:28   DG Abdomen 1 View  Addendum Date: 07/19/2023   ADDENDUM REPORT: 07/19/2023 01:57 ADDENDUM: These results were called by telephone at the time of interpretation on 07/18/2023 at 10:20 p.m. to provider Dr. Charm Barges, who verbally acknowledged these results. Electronically Signed   By: Darliss Cheney M.D.   On: 07/19/2023 01:57   Result Date: 07/19/2023 CLINICAL DATA:  NG tube EXAM: ABDOMEN - 1 VIEW COMPARISON:  CT abdomen and pelvis 07/18/2023 FINDINGS: The distal 13 cm of the enteric tube is folded upon itself. The folded portion enters the stomach, but distal tip of the tube is projected cranially in the mid esophagus. Dilated small bowel loops are  again seen compatible with small bowel obstruction. IMPRESSION: The distal 13 cm of the enteric tube is folded upon itself. The folded portion enters the stomach, but distal tip of the tube is projected cranially in the mid esophagus. Recommend repositioning. Electronically Signed: By: Darliss Cheney M.D. On: 07/18/2023 22:15   DG Chest 1 View  Result Date: 07/18/2023 CLINICAL DATA:  Cough EXAM: CHEST  1 VIEW COMPARISON:  07/03/2023, 07/04/2023 FINDINGS: Single frontal view of the chest demonstrates an unremarkable cardiac silhouette. Interval resolution of the left upper lobe airspace disease and effusions seen previously. No pneumothorax. No acute bony abnormalities. IMPRESSION: 1. No acute intrathoracic process. Resolution of the left lower lobe pneumonia and effusion seen previously. 2. Please refer to prior CT chest report 07/04/2023 describing recommended follow-up for an irregular left perihilar nodule, not readily apparent on this x-ray. Electronically Signed   By: Sharlet Salina M.D.   On: 07/18/2023 19:23   CT ABDOMEN PELVIS WO CONTRAST  Result Date: 07/18/2023 CLINICAL DATA:  Abdominal pain, nausea and vomiting EXAM: CT ABDOMEN AND PELVIS WITHOUT CONTRAST TECHNIQUE: Multidetector CT imaging of the abdomen and pelvis was performed following the standard protocol without IV contrast. Unenhanced CT was performed per clinician order. Lack of IV contrast limits sensitivity and specificity, especially for evaluation of abdominal/pelvic solid viscera. RADIATION DOSE REDUCTION: This exam was performed according to the departmental dose-optimization program which includes automated exposure control, adjustment of the mA and/or kV according to patient size and/or use of iterative reconstruction technique. COMPARISON:  01/02/2022 FINDINGS: Lower chest: No acute pleural or parenchymal lung disease. Cardiomegaly without pericardial effusion. Hepatobiliary: Stable hepatic cysts. Otherwise unremarkable  unenhanced appearance of the liver. Gallbladder is decompressed, with no evidence of cholelithiasis or cholecystitis. Pancreas: Unremarkable unenhanced appearance. Spleen: Unremarkable unenhanced appearance. Adrenals/Urinary Tract: Bilateral nonobstructing renal calculi are again identified, largest in the left kidney measuring 14 mm. Bilateral simple appearing renal cysts are again noted, which do not require specific imaging follow-up. The adrenals are unremarkable. The bladder is decompressed, limiting its evaluation. Stomach/Bowel: Diffusely dilated small bowel loops measuring up to 3.9 cm, with multiple gas fluid levels, consistent with small-bowel obstruction. Transition point may lie within the left lower quadrant at the site of a chronic left inguinal hernia, which appears to contain a small portion of mid to distal jejunum. Multiple decompressed loops of the jejunum and ileum are seen within the lower pelvis. Diverticulosis of the sigmoid colon without evidence of diverticulitis. No bowel wall thickening or inflammatory change. Vascular/Lymphatic: Aortic atherosclerosis. No enlarged abdominal or  pelvic lymph nodes. Reproductive: Uterus is atrophic, with calcified uterine fibroids noted. No adnexal masses. Other: Trace lower pelvic ascites.  No free intraperitoneal gas. Left inguinal hernia is identified, with a small knuckle of bowel extending into the hernia sac consistent with obstruction. No evidence of bowel wall thickening to suggest ischemia. Small amount of ascites within the hernia sac. Musculoskeletal: No acute displaced fractures. Partial visualization of a subcutaneous fluid collection lateral to the left hip, measuring at least 5.7 x 5.2 cm. Please correlate with any history of prior trauma as this could reflect sequela of subcutaneous hematoma. Reconstructed images demonstrate no additional findings. IMPRESSION: 1. High-grade small bowel obstruction, with transition point at the site of a left  inguinal hernia containing a small portion of mid to distal jejunum. Surgical consultation recommended. 2. Trace ascites. 3. Sigmoid diverticulosis without diverticulitis. 4. Indeterminate subcutaneous fluid collection overlying the left hip, please correlate with any history of trauma as this could reflect sequela of prior hematoma. 5. Bilateral nonobstructing renal calculi.  No obstructive uropathy. 6.  Aortic Atherosclerosis (ICD10-I70.0). Electronically Signed   By: Sharlet Salina M.D.   On: 07/18/2023 18:56    Scheduled Meds:  Chlorhexidine Gluconate Cloth  6 each Topical Daily   insulin aspart  0-5 Units Subcutaneous QHS   insulin aspart  0-9 Units Subcutaneous TID WC   Continuous Infusions:  dextrose 5 % and 0.9 % NaCl 20 mL/hr at 07/19/23 1741    LOS: 1 day   Shon Hale M.D on 07/19/2023 at 6:29 PM  Go to [email or web address] - for contact info  [facility] - Office  [phone]  If 7PM-7AM, please contact night-coverage [email or web address] 07/19/2023, 6:29 PM

## 2023-07-19 NOTE — ED Notes (Signed)
Short Stay here to transport pt to surgery; 300 notified that pt going to surgery

## 2023-07-19 NOTE — ED Notes (Signed)
Pt gone to CT 

## 2023-07-19 NOTE — ED Notes (Signed)
Dr Thomes Dinning aware of pt D5/NS rate decrease and current CBG. A/w further instructions to continue rate of 10ml/hr or increase back to 3ml/hr

## 2023-07-19 NOTE — ED Notes (Addendum)
X ray at bedside verifying placement of NG tube, 16 Fr R nare, return of gastric contents noted

## 2023-07-19 NOTE — Anesthesia Preprocedure Evaluation (Signed)
Anesthesia Evaluation  Patient identified by MRN, date of birth, ID band Patient awake    Reviewed: Allergy & Precautions, H&P , NPO status , Patient's Chart, lab work & pertinent test results, reviewed documented beta blocker date and time   History of Anesthesia Complications (+) history of anesthetic complications  Airway Mallampati: II  TM Distance: >3 FB Neck ROM: full    Dental no notable dental hx.    Pulmonary neg pulmonary ROS, shortness of breath, pneumonia, resolved   Pulmonary exam normal breath sounds clear to auscultation       Cardiovascular Exercise Tolerance: Good hypertension, + CAD and +CHF  negative cardio ROS + Valvular Problems/Murmurs  Rhythm:regular Rate:Normal     Neuro/Psych  PSYCHIATRIC DISORDERS Anxiety     negative neurological ROS  negative psych ROS   GI/Hepatic negative GI ROS, Neg liver ROS,GERD  ,,  Endo/Other  negative endocrine ROSdiabetes    Renal/GU Renal diseasenegative Renal ROS  negative genitourinary   Musculoskeletal   Abdominal   Peds  Hematology negative hematology ROS (+)   Anesthesia Other Findings NGT in place  Reproductive/Obstetrics negative OB ROS                             Anesthesia Physical Anesthesia Plan  ASA: 4 and emergent  Anesthesia Plan: General and General ETT   Post-op Pain Management:    Induction: Intravenous, Rapid sequence and Cricoid pressure planned  PONV Risk Score and Plan: Ondansetron  Airway Management Planned:   Additional Equipment:   Intra-op Plan:   Post-operative Plan: Post-operative intubation/ventilation and Possible Post-op intubation/ventilation  Informed Consent: I have reviewed the patients History and Physical, chart, labs and discussed the procedure including the risks, benefits and alternatives for the proposed anesthesia with the patient or authorized representative who has indicated  his/her understanding and acceptance.    Suspend DNR.   Dental Advisory Given  Plan Discussed with: CRNA and Surgeon  Anesthesia Plan Comments: (Judicious fluids Pressors available)       Anesthesia Quick Evaluation

## 2023-07-19 NOTE — Consult Note (Addendum)
I was present with the medical student for this service. I personally verified the history of present illness, performed the physical exam, and made the plan for this encounter. I have verified the medical student's documentation and made modifications where appropriately. I have personally documented in my own words a brief history, physical, and plan below.     Patient came in yesterday with nausea/vomiting and abdominal pain. She has had a recent admission for PNA and CHF and was discharged just recently. Her husband also reports a recent stroke. She had her last BM a few days ago and CT scan showed an incarcerated left inguinal hernia and SBO. Dr. Charm Barges felt like he had reduced it partially. And I asked for an NG to be placed in hopes of getting the bowel further reduced today to avoid a surgery until she was better optimized. NG was tried by report from Dr. Mariea Clonts 3 times last night but was not successful. This AM it was completed. I attempted reduction of the hernia twice and felt like I had gotten some of the bowel reduced, but could not be sure. A repeat Ct was done and on my review I think there is still intestine in the hernia.    Soft, mildly tender abdomen but more tender on the left groin with reduction attempts, no rebound or guarding   Personally reviewed CT from last night and today on repeat. Concern for continued bowel in the hernia. CT also showed a hematoma /fluid collection on the left hip area. She did report a fall in the past.   Discussed with her and her husband likely need for surgery, open repair, risk of needing a SBR, risk of laparotomy, risk of not being able to use mesh if the bowel is compromised, risk of recurrent hernia, plan to use mesh if the bowel is healthy, risk of bleeding, infection, prolonged hospital stay. Discussed that I would like to optimize her given her NA, her recent admission for PNA and CHF but that if the bowel cannot be reduced we will have to proceed  due to the risk of bowel strangulation.   Algis Greenhouse, MD [facility] [address] Vella Raring [place], [place] [address] [phone] (office)   Reason for Consult: Small bowel obstruction secondary to incarcerated hernia Referring Physician: Dr. Shon Hale, MD  Len Blalock Fracassi is an 89 y.o. female.  HPI: Patient seen and evaluated while resting in bed in the emergency department. Patient is a 89 y.o. female with a past medical history notable for CKD, CHF, TII DM and HTN presenting with abdominal swelling and vomiting. Patient reports worsening abdomen swelling for the past 3-4 weeks and no bowel movement for the past 4 days. Patient reports normal bowel movements prior every day. Patient denies diarrhea, hematochezia or abdominal pain. She endorses 3-4 days of vomiting. Patient denies prior abdominal surgeries or experiencing these symptoms before.   Of note, patient was admitted 07/03/23-07/06/23 for acute hypoxic respiratory failure due to CHF +/- aspiration pneumonia.   Past Medical History:  Diagnosis Date   Allergy    Anxiety    ANXIETY DISORDER, GENERALIZED 07/15/2007   Qualifier: Diagnosis of  By: Chipper Herb     Arthritis    Cancer [facility]) 2009   breast, carcinoma in situ left   Carcinoma in situ of breast 05/21/2008   Qualifier: Diagnosis of  By: [name] MD, Margaret  Diagnosed in 2009, completed 5 year course of tamoxifen, no evidence of recurrence  Carotid stenosis    11/16/2005  mild plaque formation and stenosis proximal right ECA   Cataract    Complication of anesthesia    Coronary artery disease    cardiac catheterization on 03/20/2006  LAD mid 40% stenosis, left circumflex mild 40% stenosis, RCA mid-vessel 40% to 50% lesion   EF 60%   Diabetes mellitus    GERD (gastroesophageal reflux disease)    Hernia, inguinal    left   Hyperglycemia    Hypertension    Insomnia 11/16/2011   Low blood potassium    Non-insulin dependent type 2  diabetes mellitus (HCC)    Osteoporosis    Shortness of breath    2D Echocardiogram 01/26/2009   EF of greater than 55%, mild MR, mild TR, normal ventricular function   Thickened endometrium 10/26/2017   Noted by gyne in 2017, missed 6 month follow up, referred in 09/2017   Ventricular tachycardia, non-sustained (HCC)    developed during stress test 02/08/2006, spontaneously aborted, mild reversible apical defect   Past Surgical History:  Procedure Laterality Date   BREAST LUMPECTOMY Left 2009   Left breast 2009   CATARACT EXTRACTION W/PHACO Left 10/28/2014   Procedure: PHACO EMULSION CATARACT EXTRACTION WITH INTRAOCULAR LENS IMPLANT LEFT EYE (IOC);  Surgeon: Chalmers Guest, MD;  Location: [facility] OR;  Service: Ophthalmology;  Laterality: Left;   COLONOSCOPY     cyst removed from left foot     REFRACTIVE SURGERY Left    Family History  Problem Relation Age of Onset   Hypertension Mother    Hyperlipidemia Mother    Stroke Mother    Urticaria Mother    Cancer Father        pancreatic   Colon cancer Father    Heart disease Brother 40       bypass   Heart disease Brother 7       bypass   Arthritis Other    Asthma Other    Diabetes Other    Colon cancer Paternal Aunt    Esophageal cancer Neg Hx    Stomach cancer Neg Hx    Rectal cancer Neg Hx    Social History:  reports that she has never smoked. She has never been exposed to tobacco smoke. She has never used smokeless tobacco. She reports that she does not drink alcohol and does not use drugs.  Allergies:  Allergies  Allergen Reactions   Amlodipine Swelling   Benadryl [Diphenhydramine Hcl] Hypertension   Citalopram Other (See Comments)    unknown   Farxiga [Dapagliflozin] Other (See Comments)    DKA   Metformin And Related Diarrhea    Lost appetite and weight    Tramadol Other (See Comments)    Felt light headed and dizzy   Crestor [Rosuvastatin] Other (See Comments)    "Feet swelling", makes her feel weak. Pt takes 5mg  at  home   Medications: I have reviewed the patient's current medications. Prior to Admission: (Not in a hospital admission)  Scheduled:  insulin aspart  0-5 Units Subcutaneous QHS   insulin aspart  0-9 Units Subcutaneous TID WC   Continuous:  dextrose 5 % and 0.9 % NaCl 20 mL/hr (07/19/23 0259)   NGE:[id number] **OR** acetaminophen, fentaNYL (SUBLIMAZE) injection, hydrALAZINE, ondansetron **OR** ondansetron (ZOFRAN) IV  Results for orders placed or performed during the hospital encounter of 07/18/23 (from the past 48 hour(s))  CBG monitoring, ED     Status: Abnormal   Collection Time: 07/18/23  4:00 PM  Result Value  Ref Range   Glucose-Capillary 153 (H) 70 - 99 mg/dL    Comment: Glucose reference range applies only to samples taken after fasting for at least 8 hours.  Comprehensive metabolic panel     Status: Abnormal   Collection Time: 07/18/23  5:02 PM  Result Value Ref Range   Sodium 127 (L) 135 - 145 mmol/L   Potassium 4.4 3.5 - 5.1 mmol/L   Chloride 79 (L) 98 - 111 mmol/L   CO2 32 22 - 32 mmol/L   Glucose, Bld 86 70 - 99 mg/dL    Comment: Glucose reference range applies only to samples taken after fasting for at least 8 hours.   BUN 77 (H) 8 - 23 mg/dL   Creatinine, Ser 1.61 (H) 0.44 - 1.00 mg/dL   Calcium 9.6 8.9 - 09.6 mg/dL   Total Protein 7.1 6.5 - 8.1 g/dL   Albumin 3.9 3.5 - 5.0 g/dL   AST 27 15 - 41 U/L   ALT 16 0 - 44 U/L   Alkaline Phosphatase 57 38 - 126 U/L   Total Bilirubin 1.0 0.3 - 1.2 mg/dL   GFR, Estimated 9 (L) >60 mL/min    Comment: (NOTE) Calculated using the CKD-EPI Creatinine Equation (2021)    Anion gap 16 (H) 5 - 15    Comment: Performed at [facility], [address] [place], [place] [address]  Lipase, blood     Status: None   Collection Time: 07/18/23  5:02 PM  Result Value Ref Range   Lipase 39 11 - 51 U/L    Comment: Performed at [facility], [address], [place], [place] [address]  CBC with Differential     Status: Abnormal    Collection Time: 07/18/23  5:02 PM  Result Value Ref Range   WBC 7.6 4.0 - 10.5 K/uL   RBC 3.63 (L) 3.87 - 5.11 MIL/uL   Hemoglobin 11.3 (L) 12.0 - 15.0 g/dL   HCT 14.7 (L) 82.9 - 56.2 %   MCV 91.7 80.0 - 100.0 fL   MCH 31.1 26.0 - 34.0 pg   MCHC 33.9 30.0 - 36.0 g/dL   RDW 13.0 86.5 - 78.4 %   Platelets 314 150 - 400 K/uL   nRBC 0.0 0.0 - 0.2 %   Neutrophils Relative % 82 %   Neutro Abs 6.2 1.7 - 7.7 K/uL   Lymphocytes Relative 8 %   Lymphs Abs 0.6 (L) 0.7 - 4.0 K/uL   Monocytes Relative 10 %   Monocytes Absolute 0.8 0.1 - 1.0 K/uL   Eosinophils Relative 0 %   Eosinophils Absolute 0.0 0.0 - 0.5 K/uL   Basophils Relative 0 %   Basophils Absolute 0.0 0.0 - 0.1 K/uL   Immature Granulocytes 0 %   Abs Immature Granulocytes 0.03 0.00 - 0.07 K/uL    Comment: Performed at [facility], [address] [place], [place] [address]  Magnesium     Status: Abnormal   Collection Time: 07/18/23  5:02 PM  Result Value Ref Range   Magnesium 2.5 (H) 1.7 - 2.4 mg/dL    Comment: Performed at [facility], [address], [place], [place] [address]  Brain natriuretic peptide     Status: Abnormal   Collection Time: 07/18/23  5:02 PM  Result Value Ref Range   B Natriuretic Peptide 378.0 (H) 0.0 - 100.0 pg/mL    Comment: Performed at [facility], [address], [place], [place] [address]  SARS Coronavirus 2 by RT PCR (hospital  order, performed in [facility] hospital lab) *cepheid single result test* Anterior Nasal Swab     Status: None   Collection Time: 07/18/23  6:32 PM   Specimen: Anterior Nasal Swab  Result Value Ref Range   SARS Coronavirus 2 by RT PCR NEGATIVE NEGATIVE    Comment: (NOTE) SARS-CoV-2 target nucleic acids are NOT DETECTED.  The SARS-CoV-2 RNA is generally detectable in upper and lower respiratory specimens during the acute phase of infection. The lowest concentration of SARS-CoV-2 viral copies this assay can detect is 250 copies / mL. A negative result does not preclude  SARS-CoV-2 infection and should not be used as the sole basis for treatment or other patient management decisions.  A negative result may occur with improper specimen collection / handling, submission of specimen other than nasopharyngeal swab, presence of viral mutation(s) within the areas targeted by this assay, and inadequate number of viral copies (<250 copies / mL). A negative result must be combined with clinical observations, patient history, and epidemiological information.  Fact Sheet for Patients:   RoadLapTop.co.za  Fact Sheet for Healthcare Providers: http://kim-miller.com/  This test is not yet approved or  cleared by the [place] FDA and has been authorized for detection and/or diagnosis of SARS-CoV-2 by FDA under an Emergency Use Authorization (EUA).  This EUA will remain in effect (meaning this test can be used) for the duration of the COVID-19 declaration under Section 564(b)(1) of the Act, 21 U.S.C. section 360bbb-3(b)(1), unless the authorization is terminated or revoked sooner.  Performed at [facility], [address], [place], [place] [address]   Troponin I (High Sensitivity)     Status: Abnormal   Collection Time: 07/18/23  7:02 PM  Result Value Ref Range   Troponin I (High Sensitivity) 26 (H) <18 ng/L    Comment: (NOTE) Elevated high sensitivity troponin I (hsTnI) values and significant  changes across serial measurements may suggest ACS but many other  chronic and acute conditions are known to elevate hsTnI results.  Refer to the "Links" section for chest pain algorithms and additional  guidance. Performed at [facility], [address] [place], [place] [address]   Troponin I (High Sensitivity)     Status: Abnormal   Collection Time: 07/18/23  7:27 PM  Result Value Ref Range   Troponin I (High Sensitivity) 22 (H) <18 ng/L    Comment: (NOTE) Elevated high sensitivity troponin I (hsTnI) values and  significant  changes across serial measurements may suggest ACS but many other  chronic and acute conditions are known to elevate hsTnI results.  Refer to the "Links" section for chest pain algorithms and additional  guidance. Performed at [facility], [address], [place], [place] [address]   CBG monitoring, ED     Status: Abnormal   Collection Time: 07/18/23  9:50 PM  Result Value Ref Range   Glucose-Capillary 213 (H) 70 - 99 mg/dL    Comment: Glucose reference range applies only to samples taken after fasting for at least 8 hours.  CBG monitoring, ED     Status: Abnormal   Collection Time: 07/19/23  1:21 AM  Result Value Ref Range   Glucose-Capillary 295 (H) 70 - 99 mg/dL    Comment: Glucose reference range applies only to samples taken after fasting for at least 8 hours.  CBG monitoring, ED     Status: Abnormal   Collection Time: 07/19/23  2:52 AM  Result Value Ref Range   Glucose-Capillary 294 (H) 70 - 99  mg/dL    Comment: Glucose reference range applies only to samples taken after fasting for at least 8 hours.  Basic metabolic panel     Status: Abnormal   Collection Time: 07/19/23  5:59 AM  Result Value Ref Range   Sodium 122 (L) 135 - 145 mmol/L   Potassium 3.8 3.5 - 5.1 mmol/L   Chloride 81 (L) 98 - 111 mmol/L   CO2 23 22 - 32 mmol/L   Glucose, Bld 317 (H) 70 - 99 mg/dL    Comment: Glucose reference range applies only to samples taken after fasting for at least 8 hours.   BUN 82 (H) 8 - 23 mg/dL   Creatinine, Ser 8.46 (H) 0.44 - 1.00 mg/dL   Calcium 8.2 (L) 8.9 - 10.3 mg/dL   GFR, Estimated 9 (L) >60 mL/min    Comment: (NOTE) Calculated using the CKD-EPI Creatinine Equation (2021)    Anion gap 18 (H) 5 - 15    Comment: Performed at [facility], [address], [place], [place] [address]  CBC     Status: Abnormal   Collection Time: 07/19/23  5:59 AM  Result Value Ref Range   WBC 6.4 4.0 - 10.5 K/uL   RBC 3.04 (L) 3.87 - 5.11 MIL/uL   Hemoglobin 9.1 (L) 12.0 -  15.0 g/dL   HCT 28.4 (L) 13.2 - 44.0 %   MCV 92.8 80.0 - 100.0 fL   MCH 29.9 26.0 - 34.0 pg   MCHC 32.3 30.0 - 36.0 g/dL   RDW 10.2 72.5 - 36.6 %   Platelets 236 150 - 400 K/uL   nRBC 0.0 0.0 - 0.2 %    Comment: Performed at [facility], [address], [place], [place] [address]  CBG monitoring, ED     Status: Abnormal   Collection Time: 07/19/23  7:44 AM  Result Value Ref Range   Glucose-Capillary 326 (H) 70 - 99 mg/dL    Comment: Glucose reference range applies only to samples taken after fasting for at least 8 hours.   *Note: Due to a large number of results and/or encounters for the requested time period, some results have not been displayed. A complete set of results can be found in Results Review.   DG Chest Portable 1 View  Result Date: 07/19/2023 CLINICAL DATA:  verification of NG tube placement EXAM: PORTABLE CHEST 1 VIEW COMPARISON:  07/18/2023. FINDINGS: Interval placement of enteric tube with its tip and side hole overlying left upper quadrant, within the proximal stomach. Consider further advancement by 2 inches to confidently put the side-hole within the fundus. Bilateral lung fields are clear. Again noted is elevated right hemidiaphragm. Bilateral costophrenic angles are clear. Normal cardio-mediastinal silhouette. No acute osseous abnormalities. The soft tissues are within normal limits. IMPRESSION: *No acute cardiopulmonary abnormality. *Interval placement of enteric tube with its tip and side hole overlying left upper quadrant, within the proximal stomach. Consider further advancement by 2 inches to confidently put the side-hole within the fundus. Electronically Signed   By: Jules Schick M.D.   On: 07/19/2023 09:28   DG Abdomen 1 View  Addendum Date: 07/19/2023   ADDENDUM REPORT: 07/19/2023 01:57 ADDENDUM: These results were called by telephone at the time of interpretation on 07/18/2023 at 10:20 p.m. to provider Dr. Charm Barges, who verbally acknowledged these results.  Electronically Signed   By: Darliss Cheney M.D.   On: 07/19/2023 01:57   Result Date: 07/19/2023 CLINICAL DATA:  NG tube EXAM: ABDOMEN - 1 VIEW COMPARISON:  CT abdomen and pelvis 07/18/2023 FINDINGS: The distal 13 cm of the enteric tube is folded upon itself. The folded portion enters the stomach, but distal tip of the tube is projected cranially in the mid esophagus. Dilated small bowel loops are again seen compatible with small bowel obstruction. IMPRESSION: The distal 13 cm of the enteric tube is folded upon itself. The folded portion enters the stomach, but distal tip of the tube is projected cranially in the mid esophagus. Recommend repositioning. Electronically Signed: By: Darliss Cheney M.D. On: 07/18/2023 22:15   DG Chest 1 View  Result Date: 07/18/2023 CLINICAL DATA:  Cough EXAM: CHEST  1 VIEW COMPARISON:  07/03/2023, 07/04/2023 FINDINGS: Single frontal view of the chest demonstrates an unremarkable cardiac silhouette. Interval resolution of the left upper lobe airspace disease and effusions seen previously. No pneumothorax. No acute bony abnormalities. IMPRESSION: 1. No acute intrathoracic process. Resolution of the left lower lobe pneumonia and effusion seen previously. 2. Please refer to prior CT chest report 07/04/2023 describing recommended follow-up for an irregular left perihilar nodule, not readily apparent on this x-ray. Electronically Signed   By: Sharlet Salina M.D.   On: 07/18/2023 19:23   CT ABDOMEN PELVIS WO CONTRAST  Result Date: 07/18/2023 CLINICAL DATA:  Abdominal pain, nausea and vomiting EXAM: CT ABDOMEN AND PELVIS WITHOUT CONTRAST TECHNIQUE: Multidetector CT imaging of the abdomen and pelvis was performed following the standard protocol without IV contrast. Unenhanced CT was performed per clinician order. Lack of IV contrast limits sensitivity and specificity, especially for evaluation of abdominal/pelvic solid viscera. RADIATION DOSE REDUCTION: This exam was performed  according to the departmental dose-optimization program which includes automated exposure control, adjustment of the mA and/or kV according to patient size and/or use of iterative reconstruction technique. COMPARISON:  01/02/2022 FINDINGS: Lower chest: No acute pleural or parenchymal lung disease. Cardiomegaly without pericardial effusion. Hepatobiliary: Stable hepatic cysts. Otherwise unremarkable unenhanced appearance of the liver. Gallbladder is decompressed, with no evidence of cholelithiasis or cholecystitis. Pancreas: Unremarkable unenhanced appearance. Spleen: Unremarkable unenhanced appearance. Adrenals/Urinary Tract: Bilateral nonobstructing renal calculi are again identified, largest in the left kidney measuring 14 mm. Bilateral simple appearing renal cysts are again noted, which do not require specific imaging follow-up. The adrenals are unremarkable. The bladder is decompressed, limiting its evaluation. Stomach/Bowel: Diffusely dilated small bowel loops measuring up to 3.9 cm, with multiple gas fluid levels, consistent with small-bowel obstruction. Transition point may lie within the left lower quadrant at the site of a chronic left inguinal hernia, which appears to contain a small portion of mid to distal jejunum. Multiple decompressed loops of the jejunum and ileum are seen within the lower pelvis. Diverticulosis of the sigmoid colon without evidence of diverticulitis. No bowel wall thickening or inflammatory change. Vascular/Lymphatic: Aortic atherosclerosis. No enlarged abdominal or pelvic lymph nodes. Reproductive: Uterus is atrophic, with calcified uterine fibroids noted. No adnexal masses. Other: Trace lower pelvic ascites.  No free intraperitoneal gas. Left inguinal hernia is identified, with a small knuckle of bowel extending into the hernia sac consistent with obstruction. No evidence of bowel wall thickening to suggest ischemia. Small amount of ascites within the hernia sac. Musculoskeletal:  No acute displaced fractures. Partial visualization of a subcutaneous fluid collection lateral to the left hip, measuring at least 5.7 x 5.2 cm. Please correlate with any history of prior trauma as this could reflect sequela of subcutaneous hematoma. Reconstructed images demonstrate no additional findings. IMPRESSION: 1. High-grade small bowel obstruction, with transition point at the site of  a left inguinal hernia containing a small portion of mid to distal jejunum. Surgical consultation recommended. 2. Trace ascites. 3. Sigmoid diverticulosis without diverticulitis. 4. Indeterminate subcutaneous fluid collection overlying the left hip, please correlate with any history of trauma as this could reflect sequela of prior hematoma. 5. Bilateral nonobstructing renal calculi.  No obstructive uropathy. 6.  Aortic Atherosclerosis (ICD10-I70.0). Electronically Signed   By: Sharlet Salina M.D.   On: 07/18/2023 18:56    ROS:  Pertinent items are noted in HPI.  Blood pressure (!) 175/70, pulse 83, temperature 98.5 F (36.9 C), temperature source Oral, resp. rate 17, height 5\' 1"  (1.549 m), weight 46.3 kg, SpO2 98%.  Physical Exam HENT:     Head: Atraumatic.  Eyes:     Extraocular Movements: Extraocular movements intact.  Cardiovascular:     Rate and Rhythm: Normal rate.  Pulmonary:     Effort: Pulmonary effort is normal.  Abdominal:     General: Abdomen is protuberant. There is distension.     Palpations: Abdomen is soft.     Tenderness: There is no abdominal tenderness.     Hernia: A hernia is present.  Musculoskeletal:     Comments: Left hip soft, nontender fluid collection; moves all extremities   Skin:    General: Skin is warm.  Neurological:     General: No focal deficit present.     Mental Status: She is alert and oriented to person, place, and time.  Psychiatric:        Behavior: Behavior normal.   Assessment/Plan: Patient is a 89 y.o. female presenting with abdominal distention,  vomiting and decreased BM with concerns for small bowel obstruction secondary to incarcerated hernia.  - CT revealed high-grade small bowel obstruction, with transition point at the site of a left inguinal hernia containing a small portion of mid to distal jejunum - NG tube placed to attempt bowel decompression to see if hernia can be reduced - Bedside reduction attempted - Repeat CT ordered to see if bowel is reduced - If bowel is reduced, will wait for hernia repair  - If bowel is not reduced, will consider urgent surgery  Bubba Hales 07/19/2023, 10:42 AM

## 2023-07-19 NOTE — TOC Initial Note (Signed)
Transition of Care [facility]) - Initial/Assessment Note    Patient Details  Name: Emily Jennings MRN: [id number] Date of Birth: Sep 07, 1934  Transition of Care [facility]) CM/SW Contact:    Leitha Bleak, RN Phone Number: 07/19/2023, 2:28 PM  Clinical Narrative:     Patient readmitted  with incarcerated left inguinal hernia. Surgery consult needs. NG is out. Patient recently assessed and set up with [facility]. Work up continuing, TOC following.               Expected Discharge Plan: Home w Home Health Services Barriers to Discharge: Continued Medical Work up  Patient Goals and CMS Choice Patient states their goals for this hospitalization and ongoing recovery are:: return home CMS Medicare.gov Compare Post Acute Care list provided to:: Patient Choice offered to / list presented to : Patient     Expected Discharge Plan and Services      Living arrangements for the past 2 months: Single Family Home                   Prior Living Arrangements/Services Living arrangements for the past 2 months: Single Family Home Lives with:: Spouse, Adult Children Patient language and need for interpreter reviewed:: Yes        Need for Family Participation in Patient Care: Yes (Comment) Care giver support system in place?: Yes (comment)   Criminal Activity/Legal Involvement Pertinent to Current Situation/Hospitalization: No - Comment as needed  Activities of Daily Living   ADL Screening (condition at time of admission) Independently performs ADLs?: Yes (appropriate for developmental age) Does the patient have a NEW difficulty with bathing/dressing/toileting/self-feeding that is expected to last >3 days?: No Does the patient have a NEW difficulty with getting in/out of bed, walking, or climbing stairs that is expected to last >3 days?: No Does the patient have a NEW difficulty with communication that is expected to last >3 days?: No Is the patient deaf or have difficulty hearing?: No Does  the patient have difficulty seeing, even when wearing glasses/contacts?: No Does the patient have difficulty concentrating, remembering, or making decisions?: No  Permission Sought/Granted                  Emotional Assessment     Affect (typically observed): Accepting Orientation: : Oriented to Self Alcohol / Substance Use: Not Applicable Psych Involvement: No (comment)  Admission diagnosis:  Hyponatremia [E87.1] Small bowel obstruction (HCC) [K56.609] Hypoglycemia [E16.2] Incarcerated inguinal hernia [K40.30] Incarcerated left inguinal hernia [K40.30] SBO (small bowel obstruction) (HCC) [K56.609] Patient Active Problem List   Diagnosis Date Noted   SBO (small bowel obstruction) (HCC) 07/19/2023   Incarcerated left inguinal hernia 07/18/2023   Acute kidney injury superimposed on chronic kidney disease (HCC) 07/18/2023   Hypoglycemia 07/18/2023   Uncontrolled type 2 diabetes mellitus with hypoglycemia, with long-term current use of insulin (HCC) 07/04/2023   Dysphagia 07/04/2023   HCAP (healthcare-associated pneumonia) 07/03/2023   Essential hypertension 06/15/2023   Acute on chronic renal failure (HCC) 06/02/2023   Acute diastolic CHF (congestive heart failure) (HCC) 06/01/2023   Cerebellar edema (HCC) 05/23/2023   Hyperlipidemia 05/23/2023   DNR (do not resuscitate) 05/17/2023   Vocal cord dysfunction 05/17/2023   DNR (do not resuscitate) discussion 05/17/2023   Goals of care, counseling/discussion 05/17/2023   Right cerebellar intraparenchymal hemorrhage due to hypertension 05/16/2023   Unsteady gait when walking 04/03/2023   Hospital discharge follow-up 03/06/2023   Bilateral kidney stones 03/06/2023   Hip swelling, right 02/27/2023  DKA (diabetic ketoacidosis) (HCC) 02/17/2023   High anion gap metabolic acidosis 02/17/2023   Pseudohyponatremia 02/17/2023   Hypoalbuminemia due to protein-calorie malnutrition (HCC) 02/17/2023   Dehydration 02/17/2023   Hip  pain 02/15/2023   Encounter for Medicare annual examination with abnormal findings 10/16/2022   Vitamin D deficiency 10/16/2022   Acute exacerbation of CHF (congestive heart failure) (HCC) 09/13/2022   Acute on chronic congestive heart failure with left ventricular diastolic dysfunction (HCC) 09/13/2022   Chronic kidney disease (CKD) stage G3b/A2, moderately decreased glomerular filtration rate (GFR) between 30-44 mL/min/1.73 square meter and albuminuria creatinine ratio between 30-299 mg/g (HCC) 09/13/2022   Mild aortic valve stenosis 09/13/2022   Dyspnea on exertion 09/05/2022   CHF (congestive heart failure) (HCC) 09/05/2022   Acute on chronic diastolic (congestive) heart failure (HCC) 09/05/2022   Hyperkalemia 04/09/2022   Hyperglycemic crisis due to diabetes mellitus (HCC) 03/25/2022   Hyponatremia 03/25/2022   Acute metabolic encephalopathy 03/22/2022   Hypothermia 03/22/2022   Heart murmur 03/22/2022   Bilateral lower extremity edema 03/22/2022   Syncope and collapse 01/13/2021   Lip swelling 10/30/2020   CKD stage 4 due to type 2 diabetes mellitus (HCC) 10/30/2020   Tubular adenoma of colon 10/26/2017   Diabetes (HCC) 07/27/2016   Osteoporosis 07/28/2014   Diabetic hypertension-nephrosis syndrome (HCC) 03/23/2014   Type 2 diabetes mellitus with stage 3a chronic kidney disease, with long-term current use of insulin (HCC) 09/29/2013   Allergic rhinitis 01/27/2013   GERD (gastroesophageal reflux disease) 01/12/2013   CVD (cardiovascular disease) 07/17/2011   Hypokalemia 08/31/2008   Hyperlipidemia LDL goal <100 07/15/2007   Resistant hypertension 07/15/2007   PCP:  [name], MD Pharmacy:   [facility] [phone] - , [place] - [address] AT [facility] [address]  [place] [address] Phone: [phone] Fax: [phone]  [facility]) - [place], [place] - [address] [facility] [address] [place] [address] [place]  [address] Phone: [phone] Fax: [phone]  [facility] [address] [place] [address] Phone: [phone] Fax: [phone]     Social Determinants of Health (SDOH) Social History: SDOH Screenings   Food Insecurity: No Food Insecurity (07/18/2023)  Housing: Low Risk  (07/18/2023)  Transportation Needs: No Transportation Needs (07/18/2023)  Utilities: Not At Risk (07/18/2023)  Alcohol Screen: Low Risk  (11/15/2022)  Depression (PHQ2-9): High Risk (07/16/2023)  Financial Resource Strain: Low Risk  (12/28/2022)  Physical Activity: Sufficiently Active (11/15/2022)  Social Connections: Moderately Integrated (11/15/2022)  Stress: No Stress Concern Present (12/28/2022)  Tobacco Use: Low Risk  (07/19/2023)   SDOH Interventions:     Readmission Risk Interventions    07/19/2023    2:27 PM 07/04/2023    8:27 AM 06/15/2023   12:46 PM  Readmission Risk Prevention Plan  Transportation Screening Complete Complete Complete  Medication Review Oceanographer) Complete Complete Complete  PCP or Specialist appointment within 3-5 days of discharge Not Complete  Not Complete  HRI or Home Care Consult Complete Complete Complete  SW Recovery Care/Counseling Consult Complete Complete Complete  Palliative Care Screening Not Applicable Not Applicable Not Applicable  Skilled Nursing Facility Not Applicable Not Applicable Not Applicable

## 2023-07-19 NOTE — Progress Notes (Signed)
[facility]  Updated her husband on phone. Updated team.  Step down PRN for pain HD ok NPO, NG in place due to SBR and SBO  Will await bowel function before removal or diet Labs in AM IVF SCDs  Algis Greenhouse, MD [facility] [address] [place], [place] [address] [phone] (office)

## 2023-07-19 NOTE — Transfer of Care (Signed)
Immediate Anesthesia Transfer of Care Note  Patient: Emily Jennings  Procedure(s) Performed: HERNIA REPAIR FEMORAL INCARCERATED (Left: Groin) SMALL BOWEL RESECTION (Left: Groin)  Patient Location: PACU  Anesthesia Type:General  Level of Consciousness: awake and patient cooperative  Airway & Oxygen Therapy: Patient Spontanous Breathing  Post-op Assessment: Report given to RN and Post -op Vital signs reviewed and stable  Post vital signs: Reviewed and stable  Last Vitals:  Vitals Value Taken Time  BP 183/62 07/19/23 1608  Temp 99 07/19/23  1611  Pulse 78 07/19/23 1610  Resp 13 07/19/23 1610  SpO2 100 % 07/19/23 1610  Vitals shown include unfiled device data.  Last Pain:  Vitals:   07/19/23 1236  TempSrc: Oral  PainSc: 0-No pain         Complications: No notable events documented.

## 2023-07-19 NOTE — Op Note (Signed)
[facility] Operative Note  07/19/23  Preoperative Diagnosis: Incarcerated left inguinal hernia, Small bowel obstruction   Postoperative Diagnosis: Incarcerated left femoral hernia, small bowel obstruction, ischemic bowel    Procedure(s) Performed: Primary repair femoral hernia, McVay approach, small bowel resection primary side to side anastomosis    Surgeon: Leatrice Jewels. Henreitta Leber, MD   Assistants: No qualified resident was available    Anesthesia: General endotracheal   Anesthesiologist: Windell Norfolk, MD    Specimens:  Small bowel, suture proximal    Estimated Blood Loss: Minimal   Blood Replacement: None    Complications: None   Wound Class: Contaminated    Operative Indications: [name] is a a 89 yo who had an incarcerated hernia that the ED felt like they got partially reduced. She was kept overnight and I tried to reduce it further this AM but could not be sure. I obtained another Ct scan and this showed continued obstruction and a loop of bowel in the hernia. On my review, I thought it was a femoral hernia and spoke to the radiologist who also agreed.  I discussed surgery with the patient and her husband and risk of bleeding, infection, hernia repair primary versus with mesh, need for bowel resection, need for laparotomy if I could not do the resection in the groin. I discussed potential need to remain intubated and ICU stay and possible transfer.   Findings: Incarcerated femoral hernia with ischemic bowel    Procedure: The patient was taken to the operating room and placed supine. General endotracheal anesthesia was induced. Intravenous antibiotics were  administered per protocol.  A nasogastric tube was already positioned to decompress the stomach. The abdomen was prepped and draped in the usual sterile fashion.   An incision was made in a natural skin crease between the pubic tubercle and the anterior superior iliac spine this was over the inguinal  bulge.  The incision was deepened with electrocautery through Scarpa's and Camper's fascia until the aponeurosis of the external oblique was encountered. I noted that the hernia sac was actually inferior the inguinal ligament, confirming my suspicion that this was a femoral hernia.  I then opted to clear the subcutaneous tissue around the sac and got down the fascia edges. I used a hemostat and carefully elevated the inguinal ligament off the hernia sac and incised this with cautery, protecting the sac the entire time. I was then able to open up the sac and I told a loop of bowel. This look did look ischemic. I opted to open the inguinal ligament more to see if the bowel was viable and eviscerated the distal and proximal bowel.  We watched the bowel for a while but did not see any changes. I opted to resect the small intestine in the standard fashion using two linear cutting staplers 75 mm loads in proximal and distal to the ischemic changes. The mesentery was taken with a Ligasure.  Allis clamps were used to bring the bowel side to side and enterotomies were made, performing a side to side anastomosis with a 75 mm linear cutting stapler in the antimesenteric position. The enterotomy was closed with a TA 60. Two crotch sutures were placed with 3-0 Silk and 3-0 sutures were Lemberted over the staple line.  The mesentery was closed with a 3-0 Chromic gut.  The bowel was carefully placed back into the abdomen. I ran my finger over the peritoneal cavity and felt no adhesions or other loops of bowel. I closed the  hernia sac with 2-0 Vicryl suture running.  The hernia sac was placed back into the abdominal cavity.  Irrigation was performed. Given the contamination with the small intestine resection, I could not do a plug, so I had to do a Barista for a primary repair.   An incision was made in the midportion of the external oblique aponeurosis in the direction of its fibers. The ilioinguinal nerve was not  identified. Flaps of the external oblique were developed cephalad and inferiorly.   No direct or indirect hernia sac was noted but the floor of the canal, which was weakened and open where I had extended the hernia defect through the inguinal ligament.  I identified the conjoint tendon which was lax and did not need a relaxing incision. I the conjoint tendon was sutured down to the shelving edge to the inguinal ligament with simple interrupted 2-0 Novafil sutures. In the area of the inguinal ligament that I had incised, I recreated the ligament with 2-0 Novafil sutures. The sutures bring the conjoint down the shelving edge started at the pubic tubercle and commenced laterally to the femoral vessels and a transition stitch was placed incorporating cooper's ligament and the anterior femoral sheath which I could see from the open part of the floor. Care was taken to not injure the vessels. The repair extended laterally closing the space and the floor.  After this was completed, I did note a thin layer of what almost felt like an additional layer of conjoint tendon laterally which I could separate from the external oblique. I then opted to pull this down to the shelving edge and secured it down in a similar fashion with 2-0 Novafil as medial to lateral as I could to reinforce the area given the additional layer that I was not expecting.  Hemostasis was adequate.  Irrigation was performed. The external oblique aponeurosis was closed with a 2-0 Vicryl suture in a running fashion. I never saw the ilioinguinal nerve. In the femoral area, I did re-approximate the fascia that had been separated by the defect to ensure no dead space. This was done with interrupted 2-0 Novafil.  The cavity above was closed with 3-0 Vicryl interrupted sutures and Scarpa's fascia was closed with 3-0 Vicryl interrupted sutures. The skin was closed with staples. A honeycomb dressing was placed.  The patient tolerated the procedure well and was  taken to the PACU in stable condition. All counts were correct at the end of the case.        Emily Greenhouse, MD [facility] [address] [place], [place] [address] [phone] (office)

## 2023-07-19 NOTE — ED Notes (Signed)
ED TO INPATIENT HANDOFF REPORT  ED Nurse Name and Phone #:  Neldon Mc RN [phone]  S Name/Age/Gender Emily Jennings 89 y.o. female Room/Bed: APA07/APA07  Code Status   Code Status: Full Code  Home/SNF/Other Home Patient oriented to: self, place, time, and situation Is this baseline? Yes   Triage Complete: Triage complete  Chief Complaint Incarcerated left inguinal hernia [K40.30]  Triage Note Pt c/o abd pain, distended stomach and poor po intake x 2 weeks. When ems arrived pt's blood sugar was 50. Ems gave pt 400cc of D 10.   Allergies Allergies  Allergen Reactions   Amlodipine Swelling   Benadryl [Diphenhydramine Hcl] Hypertension   Citalopram Other (See Comments)    unknown   Farxiga [Dapagliflozin] Other (See Comments)    DKA   Metformin And Related Diarrhea    Lost appetite and weight    Tramadol Other (See Comments)    Felt light headed and dizzy   Crestor [Rosuvastatin] Other (See Comments)    "Feet swelling", makes her feel weak. Pt takes 5mg  at home    Level of Care/Admitting Diagnosis ED Disposition     ED Disposition  Admit   Condition  --   Comment  Hospital Area: [facility] [[id number]]  Level of Care: Med-Surg [16]  Covid Evaluation: Asymptomatic - no recent exposure (last 10 days) testing not required  Diagnosis: Incarcerated left inguinal hernia [[id number]]  Admitting Physician: Onnie Boer [[id number]]  Attending Physician: Onnie Boer [id number]  Certification:: I certify this patient will need inpatient services for at least 2 midnights  Expected Medical Readiness: 07/21/2023          B Medical/Surgery History Past Medical History:  Diagnosis Date   Allergy    Anxiety    ANXIETY DISORDER, GENERALIZED 07/15/2007   Qualifier: Diagnosis of  By: Chipper Herb     Arthritis    Cancer [facility]) 2009   breast, carcinoma in situ left   Carcinoma in situ of breast 05/21/2008   Qualifier: Diagnosis of  By: [name] MD,  Margaret  Diagnosed in 2009, completed 5 year course of tamoxifen, no evidence of recurrence    Carotid stenosis    11/16/2005  mild plaque formation and stenosis proximal right ECA   Cataract    Complication of anesthesia    Coronary artery disease    cardiac catheterization on 03/20/2006  LAD mid 40% stenosis, left circumflex mild 40% stenosis, RCA mid-vessel 40% to 50% lesion   EF 60%   Diabetes mellitus    GERD (gastroesophageal reflux disease)    Hernia, inguinal    left   Hyperglycemia    Hypertension    Insomnia 11/16/2011   Low blood potassium    Non-insulin dependent type 2 diabetes mellitus (HCC)    Osteoporosis    Shortness of breath    2D Echocardiogram 01/26/2009   EF of greater than 55%, mild MR, mild TR, normal ventricular function   Thickened endometrium 10/26/2017   Noted by gyne in 2017, missed 6 month follow up, referred in 09/2017   Ventricular tachycardia, non-sustained (HCC)    developed during stress test 02/08/2006, spontaneously aborted, mild reversible apical defect   Past Surgical History:  Procedure Laterality Date   BREAST LUMPECTOMY Left 2009   Left breast 2009   CATARACT EXTRACTION W/PHACO Left 10/28/2014   Procedure: PHACO EMULSION CATARACT EXTRACTION WITH INTRAOCULAR LENS IMPLANT LEFT EYE (IOC);  Surgeon: Chalmers Guest, MD;  Location: [facility] OR;  Service: Ophthalmology;  Laterality: Left;   COLONOSCOPY     cyst removed from left foot     REFRACTIVE SURGERY Left      A IV Location/Drains/Wounds Patient Lines/Drains/Airways Status     Active Line/Drains/Airways     Name Placement date Placement time Site Days   Peripheral IV 07/18/23 Left Antecubital 07/18/23  1806  Antecubital  1   NG/OG Vented/Dual Lumen 16 Fr. Right nare External length of tube 50 cm 07/19/23  0809  Right nare  less than 1            Intake/Output Last 24 hours  Intake/Output Summary (Last 24 hours) at 07/19/2023 1218 Last data filed at 07/18/2023 2257 Gross per 24 hour   Intake 791.67 ml  Output --  Net 791.67 ml    Labs/Imaging Results for orders placed or performed during the hospital encounter of 07/18/23 (from the past 48 hour(s))  CBG monitoring, ED     Status: Abnormal   Collection Time: 07/18/23  4:00 PM  Result Value Ref Range   Glucose-Capillary 153 (H) 70 - 99 mg/dL    Comment: Glucose reference range applies only to samples taken after fasting for at least 8 hours.  Comprehensive metabolic panel     Status: Abnormal   Collection Time: 07/18/23  5:02 PM  Result Value Ref Range   Sodium 127 (L) 135 - 145 mmol/L   Potassium 4.4 3.5 - 5.1 mmol/L   Chloride 79 (L) 98 - 111 mmol/L   CO2 32 22 - 32 mmol/L   Glucose, Bld 86 70 - 99 mg/dL    Comment: Glucose reference range applies only to samples taken after fasting for at least 8 hours.   BUN 77 (H) 8 - 23 mg/dL   Creatinine, Ser 1.61 (H) 0.44 - 1.00 mg/dL   Calcium 9.6 8.9 - 09.6 mg/dL   Total Protein 7.1 6.5 - 8.1 g/dL   Albumin 3.9 3.5 - 5.0 g/dL   AST 27 15 - 41 U/L   ALT 16 0 - 44 U/L   Alkaline Phosphatase 57 38 - 126 U/L   Total Bilirubin 1.0 0.3 - 1.2 mg/dL   GFR, Estimated 9 (L) >60 mL/min    Comment: (NOTE) Calculated using the CKD-EPI Creatinine Equation (2021)    Anion gap 16 (H) 5 - 15    Comment: Performed at [facility], [address] [place], [place] [address]  Lipase, blood     Status: None   Collection Time: 07/18/23  5:02 PM  Result Value Ref Range   Lipase 39 11 - 51 U/L    Comment: Performed at [facility], [address], [place], [place] [address]  CBC with Differential     Status: Abnormal   Collection Time: 07/18/23  5:02 PM  Result Value Ref Range   WBC 7.6 4.0 - 10.5 K/uL   RBC 3.63 (L) 3.87 - 5.11 MIL/uL   Hemoglobin 11.3 (L) 12.0 - 15.0 g/dL   HCT 14.7 (L) 82.9 - 56.2 %   MCV 91.7 80.0 - 100.0 fL   MCH 31.1 26.0 - 34.0 pg   MCHC 33.9 30.0 - 36.0 g/dL   RDW 13.0 86.5 - 78.4 %   Platelets 314 150 - 400 K/uL   nRBC 0.0 0.0 - 0.2 %   Neutrophils  Relative % 82 %   Neutro Abs 6.2 1.7 - 7.7 K/uL   Lymphocytes Relative 8 %   Lymphs Abs 0.6 (L) 0.7 - 4.0 K/uL  Monocytes Relative 10 %   Monocytes Absolute 0.8 0.1 - 1.0 K/uL   Eosinophils Relative 0 %   Eosinophils Absolute 0.0 0.0 - 0.5 K/uL   Basophils Relative 0 %   Basophils Absolute 0.0 0.0 - 0.1 K/uL   Immature Granulocytes 0 %   Abs Immature Granulocytes 0.03 0.00 - 0.07 K/uL    Comment: Performed at [facility], [address], [place], [place] [address]  Magnesium     Status: Abnormal   Collection Time: 07/18/23  5:02 PM  Result Value Ref Range   Magnesium 2.5 (H) 1.7 - 2.4 mg/dL    Comment: Performed at [facility], [address], [place], [place] [address]  Brain natriuretic peptide     Status: Abnormal   Collection Time: 07/18/23  5:02 PM  Result Value Ref Range   B Natriuretic Peptide 378.0 (H) 0.0 - 100.0 pg/mL    Comment: Performed at [facility], [address] [place], [place] [address]  SARS Coronavirus 2 by RT PCR (hospital order, performed in [facility] hospital lab) *cepheid single result test* Anterior Nasal Swab     Status: None   Collection Time: 07/18/23  6:32 PM   Specimen: Anterior Nasal Swab  Result Value Ref Range   SARS Coronavirus 2 by RT PCR NEGATIVE NEGATIVE    Comment: (NOTE) SARS-CoV-2 target nucleic acids are NOT DETECTED.  The SARS-CoV-2 RNA is generally detectable in upper and lower respiratory specimens during the acute phase of infection. The lowest concentration of SARS-CoV-2 viral copies this assay can detect is 250 copies / mL. A negative result does not preclude SARS-CoV-2 infection and should not be used as the sole basis for treatment or other patient management decisions.  A negative result may occur with improper specimen collection / handling, submission of specimen other than nasopharyngeal swab, presence of viral mutation(s) within the areas targeted by this assay, and inadequate number of viral copies (<250 copies /  mL). A negative result must be combined with clinical observations, patient history, and epidemiological information.  Fact Sheet for Patients:   RoadLapTop.co.za  Fact Sheet for Healthcare Providers: http://kim-miller.com/  This test is not yet approved or  cleared by the [place] FDA and has been authorized for detection and/or diagnosis of SARS-CoV-2 by FDA under an Emergency Use Authorization (EUA).  This EUA will remain in effect (meaning this test can be used) for the duration of the COVID-19 declaration under Section 564(b)(1) of the Act, 21 U.S.C. section 360bbb-3(b)(1), unless the authorization is terminated or revoked sooner.  Performed at [facility], [address] [place], [place] [address]   Troponin I (High Sensitivity)     Status: Abnormal   Collection Time: 07/18/23  7:02 PM  Result Value Ref Range   Troponin I (High Sensitivity) 26 (H) <18 ng/L    Comment: (NOTE) Elevated high sensitivity troponin I (hsTnI) values and significant  changes across serial measurements may suggest ACS but many other  chronic and acute conditions are known to elevate hsTnI results.  Refer to the "Links" section for chest pain algorithms and additional  guidance. Performed at [facility], [address] [place], [place] [address]   Troponin I (High Sensitivity)     Status: Abnormal   Collection Time: 07/18/23  7:27 PM  Result Value Ref Range   Troponin I (High Sensitivity) 22 (H) <18 ng/L    Comment: (NOTE) Elevated high sensitivity troponin I (hsTnI) values and significant  changes across serial measurements may  suggest ACS but many other  chronic and acute conditions are known to elevate hsTnI results.  Refer to the "Links" section for chest pain algorithms and additional  guidance. Performed at [facility], [address] [place], [place] [address]   CBG monitoring, ED     Status: Abnormal   Collection Time: 07/18/23  9:50  PM  Result Value Ref Range   Glucose-Capillary 213 (H) 70 - 99 mg/dL    Comment: Glucose reference range applies only to samples taken after fasting for at least 8 hours.  CBG monitoring, ED     Status: Abnormal   Collection Time: 07/19/23  1:21 AM  Result Value Ref Range   Glucose-Capillary 295 (H) 70 - 99 mg/dL    Comment: Glucose reference range applies only to samples taken after fasting for at least 8 hours.  CBG monitoring, ED     Status: Abnormal   Collection Time: 07/19/23  2:52 AM  Result Value Ref Range   Glucose-Capillary 294 (H) 70 - 99 mg/dL    Comment: Glucose reference range applies only to samples taken after fasting for at least 8 hours.  Basic metabolic panel     Status: Abnormal   Collection Time: 07/19/23  5:59 AM  Result Value Ref Range   Sodium 122 (L) 135 - 145 mmol/L   Potassium 3.8 3.5 - 5.1 mmol/L   Chloride 81 (L) 98 - 111 mmol/L   CO2 23 22 - 32 mmol/L   Glucose, Bld 317 (H) 70 - 99 mg/dL    Comment: Glucose reference range applies only to samples taken after fasting for at least 8 hours.   BUN 82 (H) 8 - 23 mg/dL   Creatinine, Ser 6.23 (H) 0.44 - 1.00 mg/dL   Calcium 8.2 (L) 8.9 - 10.3 mg/dL   GFR, Estimated 9 (L) >60 mL/min    Comment: (NOTE) Calculated using the CKD-EPI Creatinine Equation (2021)    Anion gap 18 (H) 5 - 15    Comment: Performed at [facility], [address], [place], [place] [address]  CBC     Status: Abnormal   Collection Time: 07/19/23  5:59 AM  Result Value Ref Range   WBC 6.4 4.0 - 10.5 K/uL   RBC 3.04 (L) 3.87 - 5.11 MIL/uL   Hemoglobin 9.1 (L) 12.0 - 15.0 g/dL   HCT 15.1 (L) 76.1 - 60.7 %   MCV 92.8 80.0 - 100.0 fL   MCH 29.9 26.0 - 34.0 pg   MCHC 32.3 30.0 - 36.0 g/dL   RDW 37.1 06.2 - 69.4 %   Platelets 236 150 - 400 K/uL   nRBC 0.0 0.0 - 0.2 %    Comment: Performed at [facility], [address], [place], [place] [address]  CBG monitoring, ED     Status: Abnormal   Collection Time: 07/19/23  7:44 AM  Result  Value Ref Range   Glucose-Capillary 326 (H) 70 - 99 mg/dL    Comment: Glucose reference range applies only to samples taken after fasting for at least 8 hours.  CBG monitoring, ED     Status: Abnormal   Collection Time: 07/19/23 10:27 AM  Result Value Ref Range   Glucose-Capillary 309 (H) 70 - 99 mg/dL    Comment: Glucose reference range applies only to samples taken after fasting for at least 8 hours.  CBG monitoring, ED     Status: Abnormal   Collection Time: 07/19/23 12:03 PM  Result Value Ref Range  Glucose-Capillary 358 (H) 70 - 99 mg/dL    Comment: Glucose reference range applies only to samples taken after fasting for at least 8 hours.   *Note: Due to a large number of results and/or encounters for the requested time period, some results have not been displayed. A complete set of results can be found in Results Review.   DG Chest Portable 1 View  Result Date: 07/19/2023 CLINICAL DATA:  verification of NG tube placement EXAM: PORTABLE CHEST 1 VIEW COMPARISON:  07/18/2023. FINDINGS: Interval placement of enteric tube with its tip and side hole overlying left upper quadrant, within the proximal stomach. Consider further advancement by 2 inches to confidently put the side-hole within the fundus. Bilateral lung fields are clear. Again noted is elevated right hemidiaphragm. Bilateral costophrenic angles are clear. Normal cardio-mediastinal silhouette. No acute osseous abnormalities. The soft tissues are within normal limits. IMPRESSION: *No acute cardiopulmonary abnormality. *Interval placement of enteric tube with its tip and side hole overlying left upper quadrant, within the proximal stomach. Consider further advancement by 2 inches to confidently put the side-hole within the fundus. Electronically Signed   By: Jules Schick M.D.   On: 07/19/2023 09:28   DG Abdomen 1 View  Addendum Date: 07/19/2023   ADDENDUM REPORT: 07/19/2023 01:57 ADDENDUM: These results were called by telephone at  the time of interpretation on 07/18/2023 at 10:20 p.m. to provider Dr. Charm Barges, who verbally acknowledged these results. Electronically Signed   By: Darliss Cheney M.D.   On: 07/19/2023 01:57   Result Date: 07/19/2023 CLINICAL DATA:  NG tube EXAM: ABDOMEN - 1 VIEW COMPARISON:  CT abdomen and pelvis 07/18/2023 FINDINGS: The distal 13 cm of the enteric tube is folded upon itself. The folded portion enters the stomach, but distal tip of the tube is projected cranially in the mid esophagus. Dilated small bowel loops are again seen compatible with small bowel obstruction. IMPRESSION: The distal 13 cm of the enteric tube is folded upon itself. The folded portion enters the stomach, but distal tip of the tube is projected cranially in the mid esophagus. Recommend repositioning. Electronically Signed: By: Darliss Cheney M.D. On: 07/18/2023 22:15   DG Chest 1 View  Result Date: 07/18/2023 CLINICAL DATA:  Cough EXAM: CHEST  1 VIEW COMPARISON:  07/03/2023, 07/04/2023 FINDINGS: Single frontal view of the chest demonstrates an unremarkable cardiac silhouette. Interval resolution of the left upper lobe airspace disease and effusions seen previously. No pneumothorax. No acute bony abnormalities. IMPRESSION: 1. No acute intrathoracic process. Resolution of the left lower lobe pneumonia and effusion seen previously. 2. Please refer to prior CT chest report 07/04/2023 describing recommended follow-up for an irregular left perihilar nodule, not readily apparent on this x-ray. Electronically Signed   By: Sharlet Salina M.D.   On: 07/18/2023 19:23   CT ABDOMEN PELVIS WO CONTRAST  Result Date: 07/18/2023 CLINICAL DATA:  Abdominal pain, nausea and vomiting EXAM: CT ABDOMEN AND PELVIS WITHOUT CONTRAST TECHNIQUE: Multidetector CT imaging of the abdomen and pelvis was performed following the standard protocol without IV contrast. Unenhanced CT was performed per clinician order. Lack of IV contrast limits sensitivity and  specificity, especially for evaluation of abdominal/pelvic solid viscera. RADIATION DOSE REDUCTION: This exam was performed according to the departmental dose-optimization program which includes automated exposure control, adjustment of the mA and/or kV according to patient size and/or use of iterative reconstruction technique. COMPARISON:  01/02/2022 FINDINGS: Lower chest: No acute pleural or parenchymal lung disease. Cardiomegaly without pericardial effusion. Hepatobiliary: Stable hepatic  cysts. Otherwise unremarkable unenhanced appearance of the liver. Gallbladder is decompressed, with no evidence of cholelithiasis or cholecystitis. Pancreas: Unremarkable unenhanced appearance. Spleen: Unremarkable unenhanced appearance. Adrenals/Urinary Tract: Bilateral nonobstructing renal calculi are again identified, largest in the left kidney measuring 14 mm. Bilateral simple appearing renal cysts are again noted, which do not require specific imaging follow-up. The adrenals are unremarkable. The bladder is decompressed, limiting its evaluation. Stomach/Bowel: Diffusely dilated small bowel loops measuring up to 3.9 cm, with multiple gas fluid levels, consistent with small-bowel obstruction. Transition point may lie within the left lower quadrant at the site of a chronic left inguinal hernia, which appears to contain a small portion of mid to distal jejunum. Multiple decompressed loops of the jejunum and ileum are seen within the lower pelvis. Diverticulosis of the sigmoid colon without evidence of diverticulitis. No bowel wall thickening or inflammatory change. Vascular/Lymphatic: Aortic atherosclerosis. No enlarged abdominal or pelvic lymph nodes. Reproductive: Uterus is atrophic, with calcified uterine fibroids noted. No adnexal masses. Other: Trace lower pelvic ascites.  No free intraperitoneal gas. Left inguinal hernia is identified, with a small knuckle of bowel extending into the hernia sac consistent with obstruction.  No evidence of bowel wall thickening to suggest ischemia. Small amount of ascites within the hernia sac. Musculoskeletal: No acute displaced fractures. Partial visualization of a subcutaneous fluid collection lateral to the left hip, measuring at least 5.7 x 5.2 cm. Please correlate with any history of prior trauma as this could reflect sequela of subcutaneous hematoma. Reconstructed images demonstrate no additional findings. IMPRESSION: 1. High-grade small bowel obstruction, with transition point at the site of a left inguinal hernia containing a small portion of mid to distal jejunum. Surgical consultation recommended. 2. Trace ascites. 3. Sigmoid diverticulosis without diverticulitis. 4. Indeterminate subcutaneous fluid collection overlying the left hip, please correlate with any history of trauma as this could reflect sequela of prior hematoma. 5. Bilateral nonobstructing renal calculi.  No obstructive uropathy. 6.  Aortic Atherosclerosis (ICD10-I70.0). Electronically Signed   By: Sharlet Salina M.D.   On: 07/18/2023 18:56    Pending Labs Unresulted Labs (From admission, onward)    None       Vitals/Pain Today's Vitals   07/19/23 1200 07/19/23 1213 07/19/23 1215 07/19/23 1215  BP: (!) 179/64     Pulse: 84     Resp: 19  (!) 26   Temp:  98.9 F (37.2 C)    TempSrc:  Oral    SpO2: 97%  95%   Weight:      Height:      PainSc:    0-No pain    Isolation Precautions Airborne and Contact precautions  Medications Medications  dextrose 5 %-0.9 % sodium chloride infusion ( Intravenous Rate/Dose Verify 07/19/23 1215)  fentaNYL (SUBLIMAZE) injection 25 mcg (has no administration in time range)  hydrALAZINE (APRESOLINE) injection 5 mg (has no administration in time range)  ondansetron (ZOFRAN) tablet 4 mg (has no administration in time range)    Or  ondansetron (ZOFRAN) injection 4 mg (has no administration in time range)  acetaminophen (TYLENOL) tablet 650 mg (has no administration in time  range)    Or  acetaminophen (TYLENOL) suppository 650 mg (has no administration in time range)  insulin aspart (novoLOG) injection 0-9 Units (9 Units Subcutaneous Given 07/19/23 1210)  insulin aspart (novoLOG) injection 0-5 Units (has no administration in time range)  Chlorhexidine Gluconate Cloth 2 % PADS 6 each (has no administration in time range)  ceFAZolin (ANCEF) IVPB 2g/100 mL  premix (has no administration in time range)  sodium chloride 0.9 % bolus 500 mL (0 mLs Intravenous Stopped 07/18/23 2030)  ondansetron (ZOFRAN) injection 4 mg (4 mg Intravenous Given 07/18/23 1806)  pantoprazole (PROTONIX) injection 40 mg (40 mg Intravenous Given 07/18/23 1807)  fentaNYL (SUBLIMAZE) injection 50 mcg (50 mcg Intravenous Given 07/18/23 1938)  0.9 %  sodium chloride infusion (0 mLs Intravenous Stopped 07/18/23 2257)    Mobility walks     Focused Assessments    R Recommendations: See Admitting Provider Note  Report given to:  300 RN  Additional Notes:

## 2023-07-20 ENCOUNTER — Encounter (HOSPITAL_COMMUNITY): Payer: Self-pay | Admitting: General Surgery

## 2023-07-20 DIAGNOSIS — K4030 Unilateral inguinal hernia, with obstruction, without gangrene, not specified as recurrent: Secondary | ICD-10-CM | POA: Diagnosis not present

## 2023-07-20 LAB — CBC
HCT: 29.1 % — ABNORMAL LOW (ref 36.0–46.0)
Hemoglobin: 9.6 g/dL — ABNORMAL LOW (ref 12.0–15.0)
MCH: 30.6 pg (ref 26.0–34.0)
MCHC: 33 g/dL (ref 30.0–36.0)
MCV: 92.7 fL (ref 80.0–100.0)
Platelets: 250 10*3/uL (ref 150–400)
RBC: 3.14 MIL/uL — ABNORMAL LOW (ref 3.87–5.11)
RDW: 13.5 % (ref 11.5–15.5)
WBC: 6.3 10*3/uL (ref 4.0–10.5)
nRBC: 0 % (ref 0.0–0.2)

## 2023-07-20 LAB — GLUCOSE, CAPILLARY
Glucose-Capillary: 362 mg/dL — ABNORMAL HIGH (ref 70–99)
Glucose-Capillary: 253 mg/dL — ABNORMAL HIGH (ref 70–99)
Glucose-Capillary: 185 mg/dL — ABNORMAL HIGH (ref 70–99)
Glucose-Capillary: 134 mg/dL — ABNORMAL HIGH (ref 70–99)
Glucose-Capillary: 162 mg/dL — ABNORMAL HIGH (ref 70–99)

## 2023-07-20 LAB — RENAL FUNCTION PANEL
Albumin: 2.8 g/dL — ABNORMAL LOW (ref 3.5–5.0)
Anion gap: 21 — ABNORMAL HIGH (ref 5–15)
BUN: 88 mg/dL — ABNORMAL HIGH (ref 8–23)
CO2: 20 mmol/L — ABNORMAL LOW (ref 22–32)
Calcium: 7.9 mg/dL — ABNORMAL LOW (ref 8.9–10.3)
Chloride: 85 mmol/L — ABNORMAL LOW (ref 98–111)
Creatinine, Ser: 4.69 mg/dL — ABNORMAL HIGH (ref 0.44–1.00)
GFR, Estimated: 9 mL/min — ABNORMAL LOW (ref >60)
Glucose, Bld: 368 mg/dL — ABNORMAL HIGH (ref 70–99)
Phosphorus: 6.3 mg/dL — ABNORMAL HIGH (ref 2.5–4.6)
Potassium: 3.4 mmol/L — ABNORMAL LOW (ref 3.5–5.1)
Sodium: 126 mmol/L — ABNORMAL LOW (ref 135–145)

## 2023-07-20 LAB — MAGNESIUM: Magnesium: 2.3 mg/dL (ref 1.7–2.4)

## 2023-07-20 MED ORDER — INSULIN GLARGINE-YFGN 100 UNIT/ML ~~LOC~~ SOLN
5.0000 [IU] | Freq: Every day | SUBCUTANEOUS | Status: DC
  Administered 2023-07-20: 5 [IU] via SUBCUTANEOUS
  Filled 2023-07-20 (×3): qty 0.05

## 2023-07-20 MED ORDER — POTASSIUM CHLORIDE 10 MEQ/100ML IV SOLN
10.0000 meq | INTRAVENOUS | Status: AC
  Administered 2023-07-20 (×4): 10 meq via INTRAVENOUS
  Filled 2023-07-20 (×4): qty 100

## 2023-07-20 MED ORDER — DEXTROSE-SODIUM CHLORIDE 5-0.9 % IV SOLN: INTRAVENOUS | Status: AC

## 2023-07-20 NOTE — Plan of Care (Signed)

## 2023-07-20 NOTE — Progress Notes (Signed)
1 Day Post-Op  Subjective: Patient seen and evaluated while resting in bed. She reports she is weak but feeling better. She denies abdominal pain. She is currently NPO and reports she is thirsty. She denies nausea or vomiting. She reports she is urinating ok, unsure if had a bowel movement and does not think she has passed flatus. Per nursing team, patient did not have a bowel movement overnight.   Objective: Vital signs in last 24 hours: Temp:  [98.3 F (36.8 C)-100.7 F (38.2 C)] 98.3 F (36.8 C) (11/01 1125) Pulse Rate:  [77-97] 89 (11/01 0900) Resp:  [13-25] 15 (11/01 0900) BP: (111-218)/(39-64) 143/50 (11/01 0900) SpO2:  [93 %-100 %] 100 % (11/01 0900) Weight:  [48.5 kg] 48.5 kg (10/31 1700) Last BM Date : 07/18/23  Intake/Output from previous day: 10/31 0701 - 11/01 0700 In: 1363.8 [I.V.:1213.8; IV Piggyback:150] Out: 220 [Emesis/NG output:100; Blood:20] Intake/Output this shift: Total I/O In: 5.5 [I.V.:5.5] Out: -   Physical Exam Pulmonary:     Effort: Pulmonary effort is normal.  Abdominal:     Palpations: Abdomen is soft.     Tenderness: There is no abdominal tenderness.  Skin:    Comments: Incision covered with honeycomb dressing with slight blood on dressing; no erythema around dressing  Neurological:     Mental Status: She is alert.   NG Tube Output: 100 cc @ 0712  Lab Results:  Recent Labs    07/19/23 0559 07/20/23 0829  WBC 6.4 6.3  HGB 9.1* 9.6*  HCT 28.2* 29.1*  PLT 236 250   BMET Recent Labs    07/19/23 0559 07/20/23 0829  NA 122* 126*  K 3.8 3.4*  CL 81* 85*  CO2 23 20*  GLUCOSE 317* 368*  BUN 82* 88*  CREATININE 4.51* 4.69*  CALCIUM 8.2* 7.9*   PT/INR No results for input(s): "LABPROT", "INR" in the last 72 hours.  Studies/Results: CT ABDOMEN PELVIS WO CONTRAST  Result Date: 07/19/2023 CLINICAL DATA:  Follow-up small-bowel obstruction. Left inguinal hernia. EXAM: CT ABDOMEN AND PELVIS WITHOUT CONTRAST TECHNIQUE: Multidetector CT  imaging of the abdomen and pelvis was performed following the standard protocol without IV contrast. RADIATION DOSE REDUCTION: This exam was performed according to the departmental dose-optimization program which includes automated exposure control, adjustment of the mA and/or kV according to patient size and/or use of iterative reconstruction technique. COMPARISON:  07/18/2023 FINDINGS: Lower chest: No acute findings. Hepatobiliary: Stable small cysts in the right and left lobes. No suspicious mass visualized on this unenhanced exam. Prior cholecystectomy. No evidence of biliary obstruction. Pancreas: No mass or inflammatory process visualized on this unenhanced exam. Spleen:  Within normal limits in size. Adrenals/Urinary tract: Bilateral renal calculi are again seen, with largest measuring 11 mm in the left kidney. No evidence of urolithiasis or hydronephrosis. Unremarkable unopacified urinary bladder. Stomach/Bowel: Small left groin hernia is again seen containing a small bowel loop and some adjacent fluid. This is a transition point with moderate to severe small bowel dilatation and air-fluid levels proximally. This is consistent with a small-bowel obstruction due to left groin hernia, which may represent a femoral hernia rather than inguinal hernia. This shows no significant change compared to prior study. No evidence of pneumatosis or portal venous gas, or free intraperitoneal air. Mild diffuse mesenteric edema and small amount of ascites seen, without evidence of abscess. Diverticulosis is seen mainly involving the descending and sigmoid colon, however there is no evidence of diverticulitis. Vascular/Lymphatic: No pathologically enlarged lymph nodes identified. No evidence  of abdominal aortic aneurysm. Reproductive: Several small calcified uterine fibroids again seen. Adnexal regions are unremarkable. Other:  Diffuse mesenteric and body wall edema. Musculoskeletal: No suspicious bone lesions identified.  Fluid collection again seen in the subcutaneous tissues along the lateral aspect of the left hip, which measures 5.5 x 4.7 cm. IMPRESSION: No significant change in small-bowel obstruction due to left groin hernia which contains a distal small bowel loop. This may represent a femoral hernia rather than an inguinal hernia. Mild diffuse mesenteric edema and small amount of ascites. Stable subcutaneous fluid collection along the lateral aspect of the left hip. Colonic diverticulosis, without radiographic evidence of diverticulitis. Bilateral nephrolithiasis. No evidence of urolithiasis or hydronephrosis. Small calcified uterine fibroids. Electronically Signed   By: Danae Orleans M.D.   On: 07/19/2023 13:52   DG Chest Portable 1 View  Result Date: 07/19/2023 CLINICAL DATA:  verification of NG tube placement EXAM: PORTABLE CHEST 1 VIEW COMPARISON:  07/18/2023. FINDINGS: Interval placement of enteric tube with its tip and side hole overlying left upper quadrant, within the proximal stomach. Consider further advancement by 2 inches to confidently put the side-hole within the fundus. Bilateral lung fields are clear. Again noted is elevated right hemidiaphragm. Bilateral costophrenic angles are clear. Normal cardio-mediastinal silhouette. No acute osseous abnormalities. The soft tissues are within normal limits. IMPRESSION: *No acute cardiopulmonary abnormality. *Interval placement of enteric tube with its tip and side hole overlying left upper quadrant, within the proximal stomach. Consider further advancement by 2 inches to confidently put the side-hole within the fundus. Electronically Signed   By: Jules Schick M.D.   On: 07/19/2023 09:28   DG Abdomen 1 View  Addendum Date: 07/19/2023   ADDENDUM REPORT: 07/19/2023 01:57 ADDENDUM: These results were called by telephone at the time of interpretation on 07/18/2023 at 10:20 p.m. to provider Dr. Charm Barges, who verbally acknowledged these results. Electronically Signed    By: Darliss Cheney M.D.   On: 07/19/2023 01:57   Result Date: 07/19/2023 CLINICAL DATA:  NG tube EXAM: ABDOMEN - 1 VIEW COMPARISON:  CT abdomen and pelvis 07/18/2023 FINDINGS: The distal 13 cm of the enteric tube is folded upon itself. The folded portion enters the stomach, but distal tip of the tube is projected cranially in the mid esophagus. Dilated small bowel loops are again seen compatible with small bowel obstruction. IMPRESSION: The distal 13 cm of the enteric tube is folded upon itself. The folded portion enters the stomach, but distal tip of the tube is projected cranially in the mid esophagus. Recommend repositioning. Electronically Signed: By: Darliss Cheney M.D. On: 07/18/2023 22:15   DG Chest 1 View  Result Date: 07/18/2023 CLINICAL DATA:  Cough EXAM: CHEST  1 VIEW COMPARISON:  07/03/2023, 07/04/2023 FINDINGS: Single frontal view of the chest demonstrates an unremarkable cardiac silhouette. Interval resolution of the left upper lobe airspace disease and effusions seen previously. No pneumothorax. No acute bony abnormalities. IMPRESSION: 1. No acute intrathoracic process. Resolution of the left lower lobe pneumonia and effusion seen previously. 2. Please refer to prior CT chest report 07/04/2023 describing recommended follow-up for an irregular left perihilar nodule, not readily apparent on this x-ray. Electronically Signed   By: Sharlet Salina M.D.   On: 07/18/2023 19:23   CT ABDOMEN PELVIS WO CONTRAST  Result Date: 07/18/2023 CLINICAL DATA:  Abdominal pain, nausea and vomiting EXAM: CT ABDOMEN AND PELVIS WITHOUT CONTRAST TECHNIQUE: Multidetector CT imaging of the abdomen and pelvis was performed following the standard protocol without IV contrast.  Unenhanced CT was performed per clinician order. Lack of IV contrast limits sensitivity and specificity, especially for evaluation of abdominal/pelvic solid viscera. RADIATION DOSE REDUCTION: This exam was performed according to the departmental  dose-optimization program which includes automated exposure control, adjustment of the mA and/or kV according to patient size and/or use of iterative reconstruction technique. COMPARISON:  01/02/2022 FINDINGS: Lower chest: No acute pleural or parenchymal lung disease. Cardiomegaly without pericardial effusion. Hepatobiliary: Stable hepatic cysts. Otherwise unremarkable unenhanced appearance of the liver. Gallbladder is decompressed, with no evidence of cholelithiasis or cholecystitis. Pancreas: Unremarkable unenhanced appearance. Spleen: Unremarkable unenhanced appearance. Adrenals/Urinary Tract: Bilateral nonobstructing renal calculi are again identified, largest in the left kidney measuring 14 mm. Bilateral simple appearing renal cysts are again noted, which do not require specific imaging follow-up. The adrenals are unremarkable. The bladder is decompressed, limiting its evaluation. Stomach/Bowel: Diffusely dilated small bowel loops measuring up to 3.9 cm, with multiple gas fluid levels, consistent with small-bowel obstruction. Transition point may lie within the left lower quadrant at the site of a chronic left inguinal hernia, which appears to contain a small portion of mid to distal jejunum. Multiple decompressed loops of the jejunum and ileum are seen within the lower pelvis. Diverticulosis of the sigmoid colon without evidence of diverticulitis. No bowel wall thickening or inflammatory change. Vascular/Lymphatic: Aortic atherosclerosis. No enlarged abdominal or pelvic lymph nodes. Reproductive: Uterus is atrophic, with calcified uterine fibroids noted. No adnexal masses. Other: Trace lower pelvic ascites.  No free intraperitoneal gas. Left inguinal hernia is identified, with a small knuckle of bowel extending into the hernia sac consistent with obstruction. No evidence of bowel wall thickening to suggest ischemia. Small amount of ascites within the hernia sac. Musculoskeletal: No acute displaced fractures.  Partial visualization of a subcutaneous fluid collection lateral to the left hip, measuring at least 5.7 x 5.2 cm. Please correlate with any history of prior trauma as this could reflect sequela of subcutaneous hematoma. Reconstructed images demonstrate no additional findings. IMPRESSION: 1. High-grade small bowel obstruction, with transition point at the site of a left inguinal hernia containing a small portion of mid to distal jejunum. Surgical consultation recommended. 2. Trace ascites. 3. Sigmoid diverticulosis without diverticulitis. 4. Indeterminate subcutaneous fluid collection overlying the left hip, please correlate with any history of trauma as this could reflect sequela of prior hematoma. 5. Bilateral nonobstructing renal calculi.  No obstructive uropathy. 6.  Aortic Atherosclerosis (ICD10-I70.0). Electronically Signed   By: Sharlet Salina M.D.   On: 07/18/2023 18:56    Anti-infectives: Anti-infectives (From admission, onward)    Start     Dose/Rate Route Frequency Ordered Stop   07/19/23 1300  ceFAZolin (ANCEF) IVPB 2g/100 mL premix        2 g 200 mL/hr over 30 Minutes Intravenous On call to O.R. 07/19/23 1158 07/19/23 1427     Assessment/Plan: Patient is a 89 y.o. female presenting with abdominal distention, nausea and vomiting with concern for small bowel obstruction secondary to incarcerated hernia. Patient is s/p primary repair femoral hernia (McVay approach) and small bowel resection primary side to side anastomosis on 07/19/23.  - Patient appears to be improving - No flatus or bowel movement reported, so will keep NG tube in place and keep NPO - PRN pain  - Continue SCD - Appreciate hospitalist recommendations   LOS: 2 days   Bubba Hales 07/20/2023

## 2023-07-20 NOTE — Progress Notes (Signed)
   07/20/23 2013  Vitals  BP (!) 171/59  MAP (mmHg) 92  BP Location Left Arm  BP Method Automatic  Patient Position (if appropriate) Lying  Pulse Rate 84  Pulse Rate Source Dinamap  MEWS COLOR  MEWS Score Color Green  Oxygen Therapy  SpO2 100 %  O2 Device Room Air  MEWS Score  MEWS Temp 0  MEWS Systolic 0  MEWS Pulse 0  MEWS RR 0  MEWS LOC 0  MEWS Score 0    PRN Hydralazine given. SBP >170.

## 2023-07-20 NOTE — Inpatient Diabetes Management (Signed)
Inpatient Diabetes Program Recommendations  AACE/ADA: New Consensus Statement on Inpatient Glycemic Control (2015)  Target Ranges:  Prepandial:   less than 140 mg/dL      Peak postprandial:   less than 180 mg/dL (1-2 hours)      Critically ill patients:  140 - 180 mg/dL   Lab Results  Component Value Date   GLUCAP 362 (H) 07/20/2023   HGBA1C 7.5 (A) 05/01/2023    Latest Reference Range & Units 07/19/23 07:44 07/19/23 10:27 07/19/23 12:03 07/19/23 12:43 07/19/23 15:01 07/19/23 16:10 07/19/23 23:05 07/20/23 07:43  Glucose-Capillary 70 - 99 mg/dL 811 (H) 914 (H) 782 (H) 298 (H) 239 (H) 219 (H) 204 (H) 362 (H)  (H): Data is abnormally high  Review of Glycemic Control  Diabetes history: DM2 Outpatient Diabetes medications: Tresiba 5, Humalog 5 tid, Farxiga 10  Current orders for Inpatient glycemic control: Novolog 0-9 units tid, 0-5 units hs  Inpatient Diabetes Program Recommendations:   Please consider while NPO: -Add Semglee 5 units now and daily -Change Novolog 0-9 units to q 4 hrs.  Thank you, Billy Fischer. Aveer Bartow, RN, MSN, CDCES  Diabetes Coordinator Inpatient Glycemic Control Team Team Pager [phone] (8am-5pm) 07/20/2023 9:49 AM

## 2023-07-20 NOTE — Progress Notes (Signed)
PROGRESS NOTE  Emily Jennings, is a 89 y.o. female, DOB - Jun 25, 1934, ZOX:[id number]  Admit date - 07/18/2023   Admitting Physician Emily Bohl Mariea Clonts, MD  Outpatient Primary MD for the patient is Emily Perches, MD  LOS - 2  Chief Complaint  Patient presents with   Abdominal Pain        Brief Narrative:  89 yo with past medical history relevant for CKD 4, diastolic CHF, CAD, DM2, , GERD, and HTN-admitted on 07/18/2023 with small bowel obstruction in the setting of incarcerated left inguinal hernia -Status post primary repair of femoral inguinal hernia, small bowel resection and primary side-to-side anastomosis on 07/19/2023    -Assessment and Plan: 1)Incarcerated left inguinal hernia---s/p surgical repair -Clinically and radiologically patient had incarcerated left inguinal hernia with high-grade small bowel obstruction -General Surgery consult from Emily Jennings appreciated  -patient is status post  primary repair of femoral inguinal hernia, small bowel resection and primary side-to-side anastomosis on 07/19/2023 -NG tube in situ -N.p.o., IV fluids -Continue as needed morphine sulfate IV for pain control and as needed Zofran for nausea -Further management per general surgery team  2)Acute kidney injury superimposed on chronic kidney disease IV Baseline creatinine usually between 2.6-3  -creatinine elevated at 4.56 on admission,  -Hold diuretics -Hydrate IV while n.p.o.  3) acute on chronic hyponatremia - Close to baseline 125-131. - c/n to Hydrate  4)Diastolic dysfunction CHF (congestive heart failure) (HCC) -Stable and compensated.  Last echo 04/2023 EF of 65 to 70% grade 2 DD. GDMT limited by CKD 4,  -Hold Lasix with significant AKI on CKD, allow for hydration and with n.p.o. status  5)Uncontrolled type 2 diabetes mellitus --A1c 7.5 reflecting uncontrolled DM with hyperglycemia PTA  -Hold home Tresiba, Farxiga especially while n.p.o -Add Semglee 5 units daily Use  Novolog/Humalog Sliding scale insulin with Accu-Cheks/Fingersticks as ordered   6)HTN -Hold spironolactone, nifedipine, carvedilol, Lasix, hydralazine 100 mg -Okay to use IV hydralazine as needed elevated BP  7)1.7 cm Left Thyroid Nodule---Recommend thyroid US as outpt   8)HLD--- - resume statin when oral intake resumes    9)GERD (gastroesophageal reflux disease) - -IV Pepcid while n.p.o -. may switch to PPI once oral intake resumes   10)Chronic anemia--in the setting of CKD 4  Anticipate drop in H&H in the postoperative period  11) hypokalemia----replace and recheck   Status is: Inpatient   Disposition: The patient is from: Home              Anticipated d/c is to: Home              Anticipated d/c date is: > 3 days              Patient currently is not medically stable to d/c. Barriers: Not Clinically Stable-   Code Status :  -  Code Status: Full Code   Family Communication:    Discussed with husband at bedside  DVT Prophylaxis  :   - SCDs   heparin injection 5,000 Units Start: 07/20/23 2200 SCDs Start: 07/19/23 0142   Lab Results  Component Value Date   PLT 250 07/20/2023   Inpatient Medications  Scheduled Meds:  Chlorhexidine Gluconate Cloth  6 each Topical Daily   heparin injection (subcutaneous)  5,000 Units Subcutaneous Q8H   insulin aspart  0-5 Units Subcutaneous QHS   insulin aspart  0-9 Units Subcutaneous TID WC   insulin glargine-yfgn  5 Units Subcutaneous Daily   Continuous Infusions:  famotidine (PEPCID) IV 20  mg (07/19/23 1912)   PRN Meds:.acetaminophen **OR** acetaminophen, hydrALAZINE, morphine injection, ondansetron **OR** ondansetron (ZOFRAN) IV   Anti-infectives (From admission, onward)    Start     Dose/Rate Route Frequency Ordered Stop   07/19/23 1300  ceFAZolin (ANCEF) IVPB 2g/100 mL premix        2 g 200 mL/hr over 30 Minutes Intravenous On call to O.R. 07/19/23 1158 07/19/23 1427       Subjective: Emily Jennings today has no fevers,  no emesis,  No chest pain,   - -No new concerns -Remains n.p.o. with NG tube in situ -No BM   Objective: Vitals:   07/20/23 0432 07/20/23 0742 07/20/23 0800 07/20/23 0900  BP: (!) 188/59  (!) 162/64 (!) 143/50  Pulse: 91  97 89  Resp: 14  18 15   Temp:  98.4 F (36.9 C)    TempSrc:  Oral    SpO2: 100%  100% 100%  Weight:      Height:        Intake/Output Summary (Last 24 hours) at 07/20/2023 1054 Last data filed at 07/19/2023 2000 Gross per 24 hour  Intake 1363.84 ml  Output 220 ml  Net 1143.84 ml   Filed Weights   07/18/23 1602 07/19/23 1236 07/19/23 1700  Weight: 46.3 kg 46.3 kg 48.5 kg    Physical Exam  Gen:- Awake Alert, no acute distress HEENT:- [place].AT, No sclera icterus Nose--NG tube in situ Neck-Supple Neck,No JVD,.  Lungs-  CTAB , fair symmetrical air movement CV- S1, S2 normal, regular  Abd-  +ve B.Sounds, Abd Soft, appropriate postoperative tenderness, left inguinal area postoperative wound with honeycomb dressing Extremity/Skin:- No  edema, pedal pulses present  Psych-affect is appropriate, oriented x3 Neuro-no new focal deficits, no tremors  Data Reviewed: I have personally reviewed following labs and imaging studies  CBC: Recent Labs  Lab 07/16/23 0921 07/18/23 1702 07/19/23 0559 07/20/23 0829  WBC 10.9* 7.6 6.4 6.3  NEUTROABS 9.1* 6.2  --   --   HGB 11.0* 11.3* 9.1* 9.6*  HCT 34.0 33.3* 28.2* 29.1*  MCV 95 91.7 92.8 92.7  PLT 319 314 236 250   Basic Metabolic Panel: Recent Labs  Lab 07/16/23 0921 07/18/23 1702 07/19/23 0559 07/20/23 0829  NA 130* 127* 122* 126*  K 4.4 4.4 3.8 3.4*  CL 83* 79* 81* 85*  CO2 29 32 23 20*  GLUCOSE 124* 86 317* 368*  BUN 54* 77* 82* 88*  CREATININE 3.09* 4.56* 4.51* 4.69*  CALCIUM 9.9 9.6 8.2* 7.9*  MG  --  2.5*  --  2.3  PHOS  --   --   --  6.3*   GFR: Estimated Creatinine Clearance: 6.1 mL/min (A) (by C-G formula based on SCr of 4.69 mg/dL (H)). Liver Function Tests: Recent Labs  Lab  07/18/23 1702 07/20/23 0829  AST 27  --   ALT 16  --   ALKPHOS 57  --   BILITOT 1.0  --   PROT 7.1  --   ALBUMIN 3.9 2.8*   Recent Results (from the past 240 hour(s))  SARS Coronavirus 2 by RT PCR (hospital order, performed in [facility] hospital lab) *cepheid single result test* Anterior Nasal Swab     Status: None   Collection Time: 07/18/23  6:32 PM   Specimen: Anterior Nasal Swab  Result Value Ref Range Status   SARS Coronavirus 2 by RT PCR NEGATIVE NEGATIVE Final    Comment: (NOTE) SARS-CoV-2 target nucleic acids are NOT DETECTED.  The SARS-CoV-2 RNA is generally detectable in upper and lower respiratory specimens during the acute phase of infection. The lowest concentration of SARS-CoV-2 viral copies this assay can detect is 250 copies / mL. A negative result does not preclude SARS-CoV-2 infection and should not be used as the sole basis for treatment or other patient management decisions.  A negative result may occur with improper specimen collection / handling, submission of specimen other than nasopharyngeal swab, presence of viral mutation(s) within the areas targeted by this assay, and inadequate number of viral copies (<250 copies / mL). A negative result must be combined with clinical observations, patient history, and epidemiological information.  Fact Sheet for Patients:   RoadLapTop.co.za  Fact Sheet for Healthcare Providers: http://kim-miller.com/  This test is not yet approved or  cleared by the [place] FDA and has been authorized for detection and/or diagnosis of SARS-CoV-2 by FDA under an Emergency Use Authorization (EUA).  This EUA will remain in effect (meaning this test can be used) for the duration of the COVID-19 declaration under Section 564(b)(1) of the Act, 21 U.S.C. section 360bbb-3(b)(1), unless the authorization is terminated or revoked sooner.  Performed at [facility], [address], [place], [place] [address]   MRSA Next Gen by PCR, Nasal     Status: None   Collection Time: 07/19/23  4:51 PM   Specimen: Nasal Mucosa; Nasal Swab  Result Value Ref Range Status   MRSA by PCR Next Gen NOT DETECTED NOT DETECTED Final    Comment: (NOTE) The GeneXpert MRSA Assay (FDA approved for NASAL specimens only), is one component of a comprehensive MRSA colonization surveillance program. It is not intended to diagnose MRSA infection nor to guide or monitor treatment for MRSA infections. Test performance is not FDA approved in patients less than 63 years old. Performed at [facility], [address] [place], [place] [address]     Radiology Studies: CT ABDOMEN PELVIS WO CONTRAST  Result Date: 07/19/2023 CLINICAL DATA:  Follow-up small-bowel obstruction. Left inguinal hernia. EXAM: CT ABDOMEN AND PELVIS WITHOUT CONTRAST TECHNIQUE: Multidetector CT imaging of the abdomen and pelvis was performed following the standard protocol without IV contrast. RADIATION DOSE REDUCTION: This exam was performed according to the departmental dose-optimization program which includes automated exposure control, adjustment of the mA and/or kV according to patient size and/or use of iterative reconstruction technique. COMPARISON:  07/18/2023 FINDINGS: Lower chest: No acute findings. Hepatobiliary: Stable small cysts in the right and left lobes. No suspicious mass visualized on this unenhanced exam. Prior cholecystectomy. No evidence of biliary obstruction. Pancreas: No mass or inflammatory process visualized on this unenhanced exam. Spleen:  Within normal limits in size. Adrenals/Urinary tract: Bilateral renal calculi are again seen, with largest measuring 11 mm in the left kidney. No evidence of urolithiasis or hydronephrosis. Unremarkable unopacified urinary bladder. Stomach/Bowel: Small left groin hernia is again seen containing a small bowel loop and some adjacent fluid. This is a transition point with moderate  to severe small bowel dilatation and air-fluid levels proximally. This is consistent with a small-bowel obstruction due to left groin hernia, which may represent a femoral hernia rather than inguinal hernia. This shows no significant change compared to prior study. No evidence of pneumatosis or portal venous gas, or free intraperitoneal air. Mild diffuse mesenteric edema and small amount of ascites seen, without evidence of abscess. Diverticulosis is seen mainly involving the descending and sigmoid colon, however there is no evidence of diverticulitis. Vascular/Lymphatic: No pathologically enlarged lymph nodes  identified. No evidence of abdominal aortic aneurysm. Reproductive: Several small calcified uterine fibroids again seen. Adnexal regions are unremarkable. Other:  Diffuse mesenteric and body wall edema. Musculoskeletal: No suspicious bone lesions identified. Fluid collection again seen in the subcutaneous tissues along the lateral aspect of the left hip, which measures 5.5 x 4.7 cm. IMPRESSION: No significant change in small-bowel obstruction due to left groin hernia which contains a distal small bowel loop. This may represent a femoral hernia rather than an inguinal hernia. Mild diffuse mesenteric edema and small amount of ascites. Stable subcutaneous fluid collection along the lateral aspect of the left hip. Colonic diverticulosis, without radiographic evidence of diverticulitis. Bilateral nephrolithiasis. No evidence of urolithiasis or hydronephrosis. Small calcified uterine fibroids. Electronically Signed   By: Danae Orleans M.D.   On: 07/19/2023 13:52   DG Chest Portable 1 View  Result Date: 07/19/2023 CLINICAL DATA:  verification of NG tube placement EXAM: PORTABLE CHEST 1 VIEW COMPARISON:  07/18/2023. FINDINGS: Interval placement of enteric tube with its tip and side hole overlying left upper quadrant, within the proximal stomach. Consider further advancement by 2 inches to confidently put the  side-hole within the fundus. Bilateral lung fields are clear. Again noted is elevated right hemidiaphragm. Bilateral costophrenic angles are clear. Normal cardio-mediastinal silhouette. No acute osseous abnormalities. The soft tissues are within normal limits. IMPRESSION: *No acute cardiopulmonary abnormality. *Interval placement of enteric tube with its tip and side hole overlying left upper quadrant, within the proximal stomach. Consider further advancement by 2 inches to confidently put the side-hole within the fundus. Electronically Signed   By: Jules Schick M.D.   On: 07/19/2023 09:28   DG Abdomen 1 View  Addendum Date: 07/19/2023   ADDENDUM REPORT: 07/19/2023 01:57 ADDENDUM: These results were called by telephone at the time of interpretation on 07/18/2023 at 10:20 p.m. to provider Dr. Charm Barges, who verbally acknowledged these results. Electronically Signed   By: Darliss Cheney M.D.   On: 07/19/2023 01:57   Result Date: 07/19/2023 CLINICAL DATA:  NG tube EXAM: ABDOMEN - 1 VIEW COMPARISON:  CT abdomen and pelvis 07/18/2023 FINDINGS: The distal 13 cm of the enteric tube is folded upon itself. The folded portion enters the stomach, but distal tip of the tube is projected cranially in the mid esophagus. Dilated small bowel loops are again seen compatible with small bowel obstruction. IMPRESSION: The distal 13 cm of the enteric tube is folded upon itself. The folded portion enters the stomach, but distal tip of the tube is projected cranially in the mid esophagus. Recommend repositioning. Electronically Signed: By: Darliss Cheney M.D. On: 07/18/2023 22:15   DG Chest 1 View  Result Date: 07/18/2023 CLINICAL DATA:  Cough EXAM: CHEST  1 VIEW COMPARISON:  07/03/2023, 07/04/2023 FINDINGS: Single frontal view of the chest demonstrates an unremarkable cardiac silhouette. Interval resolution of the left upper lobe airspace disease and effusions seen previously. No pneumothorax. No acute bony abnormalities.  IMPRESSION: 1. No acute intrathoracic process. Resolution of the left lower lobe pneumonia and effusion seen previously. 2. Please refer to prior CT chest report 07/04/2023 describing recommended follow-up for an irregular left perihilar nodule, not readily apparent on this x-ray. Electronically Signed   By: Sharlet Salina M.D.   On: 07/18/2023 19:23   CT ABDOMEN PELVIS WO CONTRAST  Result Date: 07/18/2023 CLINICAL DATA:  Abdominal pain, nausea and vomiting EXAM: CT ABDOMEN AND PELVIS WITHOUT CONTRAST TECHNIQUE: Multidetector CT imaging of the abdomen and pelvis was performed following the standard protocol  without IV contrast. Unenhanced CT was performed per clinician order. Lack of IV contrast limits sensitivity and specificity, especially for evaluation of abdominal/pelvic solid viscera. RADIATION DOSE REDUCTION: This exam was performed according to the departmental dose-optimization program which includes automated exposure control, adjustment of the mA and/or kV according to patient size and/or use of iterative reconstruction technique. COMPARISON:  01/02/2022 FINDINGS: Lower chest: No acute pleural or parenchymal lung disease. Cardiomegaly without pericardial effusion. Hepatobiliary: Stable hepatic cysts. Otherwise unremarkable unenhanced appearance of the liver. Gallbladder is decompressed, with no evidence of cholelithiasis or cholecystitis. Pancreas: Unremarkable unenhanced appearance. Spleen: Unremarkable unenhanced appearance. Adrenals/Urinary Tract: Bilateral nonobstructing renal calculi are again identified, largest in the left kidney measuring 14 mm. Bilateral simple appearing renal cysts are again noted, which do not require specific imaging follow-up. The adrenals are unremarkable. The bladder is decompressed, limiting its evaluation. Stomach/Bowel: Diffusely dilated small bowel loops measuring up to 3.9 cm, with multiple gas fluid levels, consistent with small-bowel obstruction. Transition  point may lie within the left lower quadrant at the site of a chronic left inguinal hernia, which appears to contain a small portion of mid to distal jejunum. Multiple decompressed loops of the jejunum and ileum are seen within the lower pelvis. Diverticulosis of the sigmoid colon without evidence of diverticulitis. No bowel wall thickening or inflammatory change. Vascular/Lymphatic: Aortic atherosclerosis. No enlarged abdominal or pelvic lymph nodes. Reproductive: Uterus is atrophic, with calcified uterine fibroids noted. No adnexal masses. Other: Trace lower pelvic ascites.  No free intraperitoneal gas. Left inguinal hernia is identified, with a small knuckle of bowel extending into the hernia sac consistent with obstruction. No evidence of bowel wall thickening to suggest ischemia. Small amount of ascites within the hernia sac. Musculoskeletal: No acute displaced fractures. Partial visualization of a subcutaneous fluid collection lateral to the left hip, measuring at least 5.7 x 5.2 cm. Please correlate with any history of prior trauma as this could reflect sequela of subcutaneous hematoma. Reconstructed images demonstrate no additional findings. IMPRESSION: 1. High-grade small bowel obstruction, with transition point at the site of a left inguinal hernia containing a small portion of mid to distal jejunum. Surgical consultation recommended. 2. Trace ascites. 3. Sigmoid diverticulosis without diverticulitis. 4. Indeterminate subcutaneous fluid collection overlying the left hip, please correlate with any history of trauma as this could reflect sequela of prior hematoma. 5. Bilateral nonobstructing renal calculi.  No obstructive uropathy. 6.  Aortic Atherosclerosis (ICD10-I70.0). Electronically Signed   By: Sharlet Salina M.D.   On: 07/18/2023 18:56    Scheduled Meds:  Chlorhexidine Gluconate Cloth  6 each Topical Daily   heparin injection (subcutaneous)  5,000 Units Subcutaneous Q8H   insulin aspart  0-5  Units Subcutaneous QHS   insulin aspart  0-9 Units Subcutaneous TID WC   insulin glargine-yfgn  5 Units Subcutaneous Daily   Continuous Infusions:  famotidine (PEPCID) IV 20 mg (07/19/23 1912)    LOS: 2 days   Shon Hale M.D on 07/20/2023 at 10:54 AM  Go to [email or web address] - for contact info  [facility] - Office  [phone]  If 7PM-7AM, please contact night-coverage [email or web address] 07/20/2023, 10:54 AM

## 2023-07-21 DIAGNOSIS — K4030 Unilateral inguinal hernia, with obstruction, without gangrene, not specified as recurrent: Secondary | ICD-10-CM | POA: Diagnosis not present

## 2023-07-21 LAB — CBC WITH DIFFERENTIAL/PLATELET
Abs Immature Granulocytes: 0.03 10*3/uL (ref 0.00–0.07)
Basophils Absolute: 0 10*3/uL (ref 0.0–0.1)
Basophils Relative: 0 %
Eosinophils Absolute: 0 10*3/uL (ref 0.0–0.5)
Eosinophils Relative: 0 %
HCT: 25.3 % — ABNORMAL LOW (ref 36.0–46.0)
Hemoglobin: 8.3 g/dL — ABNORMAL LOW (ref 12.0–15.0)
Immature Granulocytes: 0 %
Lymphocytes Relative: 5 %
Lymphs Abs: 0.4 10*3/uL — ABNORMAL LOW (ref 0.7–4.0)
MCH: 30.6 pg (ref 26.0–34.0)
MCHC: 32.8 g/dL (ref 30.0–36.0)
MCV: 93.4 fL (ref 80.0–100.0)
Monocytes Absolute: 0.3 10*3/uL (ref 0.1–1.0)
Monocytes Relative: 4 %
Neutro Abs: 6.6 10*3/uL (ref 1.7–7.7)
Neutrophils Relative %: 91 %
Platelets: 186 10*3/uL (ref 150–400)
RBC: 2.71 MIL/uL — ABNORMAL LOW (ref 3.87–5.11)
RDW: 13.4 % (ref 11.5–15.5)
WBC: 7.3 10*3/uL (ref 4.0–10.5)
nRBC: 0 % (ref 0.0–0.2)

## 2023-07-21 LAB — GLUCOSE, CAPILLARY
Glucose-Capillary: 117 mg/dL — ABNORMAL HIGH (ref 70–99)
Glucose-Capillary: 145 mg/dL — ABNORMAL HIGH (ref 70–99)
Glucose-Capillary: 202 mg/dL — ABNORMAL HIGH (ref 70–99)
Glucose-Capillary: 216 mg/dL — ABNORMAL HIGH (ref 70–99)
Glucose-Capillary: 218 mg/dL — ABNORMAL HIGH (ref 70–99)
Glucose-Capillary: 296 mg/dL — ABNORMAL HIGH (ref 70–99)
Glucose-Capillary: 53 mg/dL — ABNORMAL LOW (ref 70–99)

## 2023-07-21 LAB — BASIC METABOLIC PANEL
Anion gap: 17 — ABNORMAL HIGH (ref 5–15)
BUN: 88 mg/dL — ABNORMAL HIGH (ref 8–23)
CO2: 21 mmol/L — ABNORMAL LOW (ref 22–32)
Calcium: 7.7 mg/dL — ABNORMAL LOW (ref 8.9–10.3)
Chloride: 92 mmol/L — ABNORMAL LOW (ref 98–111)
Creatinine, Ser: 3.95 mg/dL — ABNORMAL HIGH (ref 0.44–1.00)
GFR, Estimated: 10 mL/min — ABNORMAL LOW (ref >60)
Glucose, Bld: 301 mg/dL — ABNORMAL HIGH (ref 70–99)
Potassium: 3.5 mmol/L (ref 3.5–5.1)
Sodium: 130 mmol/L — ABNORMAL LOW (ref 135–145)

## 2023-07-21 MED ORDER — DEXTROSE 50 % IV SOLN
50.0000 mL | Freq: Once | INTRAVENOUS | Status: AC
  Administered 2023-07-21: 50 mL via INTRAVENOUS
  Filled 2023-07-21: qty 50

## 2023-07-21 MED ORDER — DEXTROSE-SODIUM CHLORIDE 5-0.9 % IV SOLN: INTRAVENOUS | Status: AC

## 2023-07-21 MED ORDER — INSULIN ASPART 100 UNIT/ML IJ SOLN: 0.0000 [IU] | Freq: Every day | INTRAMUSCULAR | Status: DC

## 2023-07-21 MED ORDER — INSULIN ASPART 100 UNIT/ML IJ SOLN
0.0000 [IU] | Freq: Three times a day (TID) | INTRAMUSCULAR | Status: DC
  Administered 2023-07-21: 2 [IU] via SUBCUTANEOUS

## 2023-07-21 MED ORDER — INSULIN GLARGINE-YFGN 100 UNIT/ML ~~LOC~~ SOLN
10.0000 [IU] | Freq: Every day | SUBCUTANEOUS | Status: DC
Start: 2023-07-21 — End: 2023-07-21
  Administered 2023-07-21: 10 [IU] via SUBCUTANEOUS
  Filled 2023-07-21 (×3): qty 0.1

## 2023-07-21 MED ORDER — INSULIN ASPART 100 UNIT/ML IJ SOLN
0.0000 [IU] | Freq: Three times a day (TID) | INTRAMUSCULAR | Status: DC
  Administered 2023-07-22 (×3): 3 [IU] via SUBCUTANEOUS

## 2023-07-21 MED ORDER — DOCUSATE SODIUM 100 MG PO CAPS
100.0000 mg | ORAL_CAPSULE | Freq: Two times a day (BID) | ORAL | Status: DC
  Administered 2023-07-21 – 2023-07-23 (×4): 100 mg via ORAL
  Filled 2023-07-21 (×5): qty 1

## 2023-07-21 MED ORDER — INSULIN ASPART 100 UNIT/ML IJ SOLN
5.0000 [IU] | Freq: Once | INTRAMUSCULAR | Status: AC
  Administered 2023-07-21: 5 [IU] via SUBCUTANEOUS

## 2023-07-21 NOTE — Progress Notes (Signed)
[facility]  NG fell out yesterday, minimal output, kept out. No flatus or Bms. Has not ambulated.  BP (!) 143/57 (BP Location: Left Arm)   Pulse 92   Temp 98.3 F (36.8 C) (Oral)   Resp 15   Ht 5' (1.524 m)   Wt 48.5 kg   SpO2 94%   BMI 20.88 kg/m  Soft, minimally distended, femoral groin staple line with no erythema or drainage, old blood   Patient s/p open femoral hernia repair, [place], SBR for incarcerated hernia with obstruction and ischemic bowel. Doing well. PRN for pain IS, OOB Can have ice Ambulate Awaiting BM/ flatus  Algis Greenhouse, MD [facility] [address] [place], [place] [address] [phone] (office)

## 2023-07-21 NOTE — Plan of Care (Signed)
  Problem: Education: Goal: Knowledge of General Education information will improve Description: Including pain rating scale, medication(s)/side effects and non-pharmacologic comfort measures Outcome: Progressing   Problem: Clinical Measurements: Goal: Will remain free from infection Outcome: Progressing Goal: Diagnostic test results will improve Outcome: Progressing   Problem: Elimination: Goal: Will not experience complications related to urinary retention Outcome: Progressing   Problem: Pain Management: Goal: General experience of comfort will improve Outcome: Progressing   Problem: Safety: Goal: Ability to remain free from injury will improve Outcome: Progressing

## 2023-07-21 NOTE — Progress Notes (Signed)
Patient CBG has been trending up throughout the night. [place], DO made aware. Patient currently 296. Received one time dose for 5 units of Novolog. Insulin given.

## 2023-07-21 NOTE — Progress Notes (Signed)
PROGRESS NOTE  Emily Jennings, is a 89 y.o. female, DOB - [birth year], [id number]  Admit date - 07/18/2023   Admitting Physician Emily Deller Mariea Clonts, MD  Outpatient Primary MD for the patient is Emily Perches, MD  LOS - 3  Chief Complaint  Patient presents with   Abdominal Pain        Brief Narrative:  89 yo with past medical history relevant for CKD 4, diastolic CHF, CAD, DM2, , GERD, and HTN-admitted on 07/18/2023 with small bowel obstruction in the setting of incarcerated left inguinal hernia -Status post primary repair of femoral inguinal hernia, small bowel resection and primary side-to-side anastomosis on 07/19/2023    -Assessment and Plan: 1)Incarcerated left inguinal hernia---s/p surgical repair -Clinically and radiologically patient had incarcerated left inguinal hernia with high-grade small bowel obstruction -General Surgery consult from Emily Jennings appreciated  -patient is status post  primary repair of femoral inguinal hernia, small bowel resection and primary side-to-side anastomosis on 07/19/2023 -NG tube out 07/20/23 -Continue as needed morphine sulfate IV for pain control and as needed Zofran for nausea -No BM, no flatus -Give IVF While n.p.o. -Further management per general surgery team  2)Acute kidney injury superimposed on chronic kidney disease IV Baseline creatinine usually between 2.6-3  -creatinine elevated at 4.56 on admission,  -Hold diuretics -IVF while n.p.o.  3) acute on chronic hyponatremia - Close to baseline 125-131. - c/n to Hydrate  4)Diastolic dysfunction CHF (congestive heart failure) (HCC) -Stable and compensated.  Last echo 04/2023 EF of 65 to 70% grade 2 DD. GDMT limited by CKD 4,  -Hold Lasix with significant AKI on CKD, allow for hydration and with n.p.o. status  5)Uncontrolled type 2 diabetes mellitus --A1c 7.5 reflecting uncontrolled DM with hyperglycemia PTA  -Hold home Tresiba, Farxiga especially while n.p.o -Episode of  symptomatic hypoglycemia on 07/21/2023, insulin adjusted Use Novolog/Humalog Sliding scale insulin with Accu-Cheks/Fingersticks as ordered   6)HTN -Hold spironolactone, nifedipine, carvedilol, Lasix, hydralazine 100 mg -Okay to use IV hydralazine as needed elevated BP  7)1.7 cm Left Thyroid Nodule---Recommend thyroid US as outpt   8)HLD--- - resume statin when oral intake resumes    9)GERD (gastroesophageal reflux disease) - -IV Pepcid while n.p.o -. may switch to PPI once oral intake resumes   10)Chronic anemia--in the setting of CKD 4  Anticipate drop in H&H in the postoperative period  11) hypokalemia----replace and recheck   Status is: Inpatient   Disposition: The patient is from: Home------- awaiting return of bowel function and tolerance of oral intake prior to discharge              Anticipated d/c is to: Home              Anticipated d/c date is: > 3 days              Patient currently is not medically stable to d/c. Barriers: Not Clinically Stable-   Code Status :  -  Code Status: Full Code   Family Communication:    Discussed with husband at bedside  DVT Prophylaxis  :   - SCDs   heparin injection 5,000 Units Start: 07/20/23 2200 SCDs Start: 07/19/23 0142   Lab Results  Component Value Date   PLT 186 07/21/2023   Inpatient Medications  Scheduled Meds:  dextrose  50 mL Intravenous Once   docusate sodium  100 mg Oral BID   heparin injection (subcutaneous)  5,000 Units Subcutaneous Q8H   insulin aspart  0-6 Units Subcutaneous TID WC  Continuous Infusions:  dextrose 5 % and 0.9 % NaCl     famotidine (PEPCID) IV 20 mg (07/20/23 1959)   PRN Meds:.acetaminophen **OR** acetaminophen, hydrALAZINE, morphine injection, ondansetron **OR** ondansetron (ZOFRAN) IV   Anti-infectives (From admission, onward)    Start     Dose/Rate Route Frequency Ordered Stop   07/19/23 1300  ceFAZolin (ANCEF) IVPB 2g/100 mL premix        2 g 200 mL/hr over 30 Minutes  Intravenous On call to O.R. 07/19/23 1158 07/19/23 1427       Subjective: Emily Jennings today has no fevers, no emesis,  No chest pain,   - Pt pulled out NG tube out last evening -- No BMs..  No flatus -- had Episode of hypoglycemia requiring IV dextrose   Objective: Vitals:   07/20/23 2056 07/20/23 2113 07/21/23 0536 07/21/23 1314  BP:  (!) 163/54 (!) 143/57 (!) 153/71  Pulse:   92 78  Resp:      Temp: 98.2 F (36.8 C)  98.3 F (36.8 C) 98.5 F (36.9 C)  TempSrc: Oral  Oral Oral  SpO2:   94% 96%  Weight:      Height:        Intake/Output Summary (Last 24 hours) at 07/21/2023 1610 Last data filed at 07/21/2023 0300 Gross per 24 hour  Intake 668.51 ml  Output --  Net 668.51 ml   Filed Weights   07/18/23 1602 07/19/23 1236 07/19/23 1700  Weight: 46.3 kg 46.3 kg 48.5 kg    Physical Exam  Gen:- Awake Alert, no acute distress HEENT:- [place].AT, No sclera icterus Nose--NG tube removed on 07/20/23 Neck-Supple Neck,No JVD,.  Lungs-  CTAB , fair symmetrical air movement CV- S1, S2 normal, regular  Abd-  +ve B.Sounds, Abd Soft, appropriate postoperative tenderness, left inguinal area postoperative wound with honeycomb dressing Extremity/Skin:- No  edema, pedal pulses present  Psych-affect is appropriate, oriented x3 Neuro-no new focal deficits, no tremors  Data Reviewed: I have personally reviewed following labs and imaging studies  CBC: Recent Labs  Lab 07/16/23 0921 07/18/23 1702 07/19/23 0559 07/20/23 0829 07/21/23 0505  WBC 10.9* 7.6 6.4 6.3 7.3  NEUTROABS 9.1* 6.2  --   --  6.6  HGB 11.0* 11.3* 9.1* 9.6* 8.3*  HCT 34.0 33.3* 28.2* 29.1* 25.3*  MCV 95 91.7 92.8 92.7 93.4  PLT 319 314 236 250 186   Basic Metabolic Panel: Recent Labs  Lab 07/16/23 0921 07/18/23 1702 07/19/23 0559 07/20/23 0829 07/21/23 0505  NA 130* 127* 122* 126* 130*  K 4.4 4.4 3.8 3.4* 3.5  CL 83* 79* 81* 85* 92*  CO2 29 32 23 20* 21*  GLUCOSE 124* 86 317* 368* 301*  BUN 54* 77*  82* 88* 88*  CREATININE 3.09* 4.56* 4.51* 4.69* 3.95*  CALCIUM 9.9 9.6 8.2* 7.9* 7.7*  MG  --  2.5*  --  2.3  --   PHOS  --   --   --  6.3*  --    GFR: Estimated Creatinine Clearance: 7.2 mL/min (A) (by C-G formula based on SCr of 3.95 mg/dL (H)). Liver Function Tests: Recent Labs  Lab 07/18/23 1702 07/20/23 0829  AST 27  --   ALT 16  --   ALKPHOS 57  --   BILITOT 1.0  --   PROT 7.1  --   ALBUMIN 3.9 2.8*   Recent Results (from the past 240 hour(s))  SARS Coronavirus 2 by RT PCR (hospital order, performed in [facility] hospital  lab) *cepheid single result test* Anterior Nasal Swab     Status: None   Collection Time: 07/18/23  6:32 PM   Specimen: Anterior Nasal Swab  Result Value Ref Range Status   SARS Coronavirus 2 by RT PCR NEGATIVE NEGATIVE Final    Comment: (NOTE) SARS-CoV-2 target nucleic acids are NOT DETECTED.  The SARS-CoV-2 RNA is generally detectable in upper and lower respiratory specimens during the acute phase of infection. The lowest concentration of SARS-CoV-2 viral copies this assay can detect is 250 copies / mL. A negative result does not preclude SARS-CoV-2 infection and should not be used as the sole basis for treatment or other patient management decisions.  A negative result may occur with improper specimen collection / handling, submission of specimen other than nasopharyngeal swab, presence of viral mutation(s) within the areas targeted by this assay, and inadequate number of viral copies (<250 copies / mL). A negative result must be combined with clinical observations, patient history, and epidemiological information.  Fact Sheet for Patients:   RoadLapTop.co.za  Fact Sheet for Healthcare Providers: http://kim-miller.com/  This test is not yet approved or  cleared by the [place] FDA and has been authorized for detection and/or diagnosis of SARS-CoV-2 by FDA under an Emergency Use Authorization  (EUA).  This EUA will remain in effect (meaning this test can be used) for the duration of the COVID-19 declaration under Section 564(b)(1) of the Act, 21 U.S.C. section 360bbb-3(b)(1), unless the authorization is terminated or revoked sooner.  Performed at [facility], [address], [place], [place] [address]   MRSA Next Gen by PCR, Nasal     Status: None   Collection Time: 07/19/23  4:51 PM   Specimen: Nasal Mucosa; Nasal Swab  Result Value Ref Range Status   MRSA by PCR Next Gen NOT DETECTED NOT DETECTED Final    Comment: (NOTE) The GeneXpert MRSA Assay (FDA approved for NASAL specimens only), is one component of a comprehensive MRSA colonization surveillance program. It is not intended to diagnose MRSA infection nor to guide or monitor treatment for MRSA infections. Test performance is not FDA approved in patients less than 19 years old. Performed at [facility], [address], [place], [place] [address]    Scheduled Meds:  dextrose  50 mL Intravenous Once   docusate sodium  100 mg Oral BID   heparin injection (subcutaneous)  5,000 Units Subcutaneous Q8H   insulin aspart  0-6 Units Subcutaneous TID WC   Continuous Infusions:  dextrose 5 % and 0.9 % NaCl     famotidine (PEPCID) IV 20 mg (07/20/23 1959)    LOS: 3 days   Shon Hale M.D on 07/21/2023 at 4:10 PM  Go to [email or web address] - for contact info  [facility] - Office  [phone]  If 7PM-7AM, please contact night-coverage [email or web address] 07/21/2023, 4:10 PM

## 2023-07-21 NOTE — Plan of Care (Signed)

## 2023-07-22 DIAGNOSIS — K4030 Unilateral inguinal hernia, with obstruction, without gangrene, not specified as recurrent: Secondary | ICD-10-CM | POA: Diagnosis not present

## 2023-07-22 LAB — GLUCOSE, CAPILLARY
Glucose-Capillary: 286 mg/dL — ABNORMAL HIGH (ref 70–99)
Glucose-Capillary: 259 mg/dL — ABNORMAL HIGH (ref 70–99)
Glucose-Capillary: 294 mg/dL — ABNORMAL HIGH (ref 70–99)
Glucose-Capillary: 295 mg/dL — ABNORMAL HIGH (ref 70–99)

## 2023-07-22 MED ORDER — HYDRALAZINE HCL 25 MG PO TABS
50.0000 mg | ORAL_TABLET | Freq: Three times a day (TID) | ORAL | Status: DC
  Administered 2023-07-22 – 2023-07-23 (×5): 50 mg via ORAL
  Filled 2023-07-22 (×5): qty 2

## 2023-07-22 MED ORDER — INSULIN ASPART 100 UNIT/ML IJ SOLN
0.0000 [IU] | Freq: Every day | INTRAMUSCULAR | Status: DC
  Administered 2023-07-22: 3 [IU] via SUBCUTANEOUS
  Administered 2023-07-23: 4 [IU] via SUBCUTANEOUS
  Administered 2023-07-24 (×2): 5 [IU] via SUBCUTANEOUS

## 2023-07-22 MED ORDER — LABETALOL HCL 5 MG/ML IV SOLN
10.0000 mg | INTRAVENOUS | Status: DC | PRN
  Administered 2023-07-23 – 2023-07-25 (×3): 10 mg via INTRAVENOUS
  Filled 2023-07-22 (×4): qty 4

## 2023-07-22 MED ORDER — INSULIN GLARGINE-YFGN 100 UNIT/ML ~~LOC~~ SOLN
5.0000 [IU] | Freq: Every day | SUBCUTANEOUS | Status: DC
Start: 2023-07-22 — End: 2023-07-25
  Administered 2023-07-22 – 2023-07-24 (×3): 5 [IU] via SUBCUTANEOUS
  Filled 2023-07-22 (×4): qty 0.05

## 2023-07-22 MED ORDER — INSULIN ASPART 100 UNIT/ML IJ SOLN
0.0000 [IU] | Freq: Three times a day (TID) | INTRAMUSCULAR | Status: DC
  Administered 2023-07-23: 3 [IU] via SUBCUTANEOUS
  Administered 2023-07-23: 1 [IU] via SUBCUTANEOUS
  Administered 2023-07-23: 2 [IU] via SUBCUTANEOUS
  Administered 2023-07-24 (×2): 5 [IU] via SUBCUTANEOUS
  Administered 2023-07-24 – 2023-07-25 (×3): 3 [IU] via SUBCUTANEOUS
  Administered 2023-07-25: 9 [IU] via SUBCUTANEOUS

## 2023-07-22 MED ORDER — ISOSORBIDE MONONITRATE ER 60 MG PO TB24
30.0000 mg | ORAL_TABLET | Freq: Every day | ORAL | Status: DC
  Administered 2023-07-22 – 2023-07-23 (×2): 30 mg via ORAL
  Filled 2023-07-22 (×2): qty 1

## 2023-07-22 NOTE — Plan of Care (Signed)

## 2023-07-22 NOTE — Progress Notes (Signed)
PROGRESS NOTE  Emily Jennings, is a 89 y.o. female, DOB - Dec 12, 1933, WUJ:[id number]  Admit date - 07/18/2023   Admitting Physician Demani Mcbrien Mariea Clonts, MD  Outpatient Primary MD for the patient is [name], MD  LOS - 4  Chief Complaint  Patient presents with   Abdominal Pain      Brief Narrative:  89 yo with past medical history relevant for CKD 4, diastolic CHF, CAD, DM2, , GERD, and HTN-admitted on 07/18/2023 with small bowel obstruction in the setting of incarcerated left inguinal hernia -Status post primary repair of femoral inguinal hernia, small bowel resection and primary side-to-side anastomosis on 07/19/2023   awaiting return of bowel function and tolerance of oral intake prior to discharge   -Assessment and Plan: 1)Incarcerated left inguinal hernia---s/p surgical repair -Clinically and radiologically patient had incarcerated left inguinal hernia with high-grade small bowel obstruction -General Surgery consult from Dr. Henreitta Leber appreciated  -patient is status post  primary repair of femoral inguinal hernia, small bowel resection and primary side-to-side anastomosis on 07/19/2023 -NG tube out 07/20/23 -Continue as needed morphine sulfate IV for pain control and as needed Zofran for nausea -- Patient had small BM -Per general surgery okay to try clear liquids Further management per general surgery team  2)Acute kidney injury superimposed on chronic kidney disease IV Baseline creatinine usually between 2.6-3  -creatinine elevated at 4.56 on admission,  -Hold diuretics -Creatinine trending down  3) acute on chronic hyponatremia - Close to baseline 125-131. - c/n to Hydrate  4)Diastolic dysfunction CHF (congestive heart failure) (HCC) -Stable and compensated.  Last echo 04/2023 EF of 65 to 70% grade 2 DD. GDMT limited by CKD 4,  -Hold Lasix with significant AKI on CKD  5)Uncontrolled type 2 diabetes mellitus --A1c 7.5 reflecting uncontrolled DM with hyperglycemia  PTA  -Hold home Tresiba, Farxiga until oral intake is more reliable -Episode of symptomatic hypoglycemia on 07/21/2023, insulin adjusted Use Novolog/Humalog Sliding scale insulin with Accu-Cheks/Fingersticks as ordered   6)HTN -Hold spironolactone, nifedipine, carvedilol, Lasix, hydralazine 100 mg -Okay to use IV hydralazine as needed elevated BP  7)1.7 cm Left Thyroid Nodule---Recommend thyroid US as outpt   8)HLD--- - resume statin when oral intake resumes    9)GERD (gastroesophageal reflux disease) - -Protonix as ordered   10)Chronic anemia--in the setting of CKD 4  Anticipate drop in H&H in the postoperative period  11) hypokalemia----replace and recheck   Status is: Inpatient   Disposition: The patient is from: Home------- awaiting return of bowel function and tolerance of oral intake prior to discharge              Anticipated d/c is to: Home              Anticipated d/c date is: 2 days              Patient currently is not medically stable to d/c. Barriers: Not Clinically Stable-   Code Status :  -  Code Status: Full Code   Family Communication:    Discussed with husband at bedside  DVT Prophylaxis  :   - SCDs   heparin injection 5,000 Units Start: 07/20/23 2200 SCDs Start: 07/19/23 0142   Lab Results  Component Value Date   PLT 186 07/21/2023   Inpatient Medications  Scheduled Meds:  docusate sodium  100 mg Oral BID   heparin injection (subcutaneous)  5,000 Units Subcutaneous Q8H   hydrALAZINE  50 mg Oral TID   insulin aspart  0-6 Units Subcutaneous  TID WC   isosorbide mononitrate  30 mg Oral Daily   Continuous Infusions:  famotidine (PEPCID) IV 20 mg (07/21/23 2124)   PRN Meds:.acetaminophen **OR** acetaminophen, labetalol, morphine injection, ondansetron **OR** ondansetron (ZOFRAN) IV   Anti-infectives (From admission, onward)    Start     Dose/Rate Route Frequency Ordered Stop   07/19/23 1300  ceFAZolin (ANCEF) IVPB 2g/100 mL premix        2  g 200 mL/hr over 30 Minutes Intravenous On call to O.R. 07/19/23 1158 07/19/23 1427       Subjective: [name] today has no fevers, no emesis,  No chest pain,   - Pt pulled out NG tube out last evening -- No BMs..  No flatus -- had Episode of hypoglycemia requiring IV dextrose   Objective: Vitals:   07/22/23 0413 07/22/23 0800 07/22/23 1322 07/22/23 1708  BP: (!) 200/66 (!) 179/71 (!) 141/74 (!) 195/75  Pulse: 79 95 83   Resp: 18  17   Temp: 98.4 F (36.9 C)  97.9 F (36.6 C)   TempSrc: Oral     SpO2: 99%  100%   Weight:      Height:        Intake/Output Summary (Last 24 hours) at 07/22/2023 1752 Last data filed at 07/22/2023 1300 Gross per 24 hour  Intake 480 ml  Output --  Net 480 ml   Filed Weights   07/18/23 1602 07/19/23 1236 07/19/23 1700  Weight: 46.3 kg 46.3 kg 48.5 kg    Physical Exam  Gen:- Awake Alert, no acute distress HEENT:- [place].AT, No sclera icterus Nose--NG tube removed on 07/20/23 Neck-Supple Neck,No JVD,.  Lungs-  CTAB , fair symmetrical air movement CV- S1, S2 normal, regular  Abd-  +ve B.Sounds, Abd Soft, appropriate postoperative tenderness, left inguinal area postoperative wound with honeycomb dressing Extremity/Skin:- No  edema, pedal pulses present  Psych-affect is appropriate, oriented x3 Neuro-no new focal deficits, no tremors  Data Reviewed: I have personally reviewed following labs and imaging studies  CBC: Recent Labs  Lab 07/16/23 0921 07/18/23 1702 07/19/23 0559 07/20/23 0829 07/21/23 0505  WBC 10.9* 7.6 6.4 6.3 7.3  NEUTROABS 9.1* 6.2  --   --  6.6  HGB 11.0* 11.3* 9.1* 9.6* 8.3*  HCT 34.0 33.3* 28.2* 29.1* 25.3*  MCV 95 91.7 92.8 92.7 93.4  PLT 319 314 236 250 186   Basic Metabolic Panel: Recent Labs  Lab 07/16/23 0921 07/18/23 1702 07/19/23 0559 07/20/23 0829 07/21/23 0505  NA 130* 127* 122* 126* 130*  K 4.4 4.4 3.8 3.4* 3.5  CL 83* 79* 81* 85* 92*  CO2 29 32 23 20* 21*  GLUCOSE 124* 86 317* 368* 301*   BUN 54* 77* 82* 88* 88*  CREATININE 3.09* 4.56* 4.51* 4.69* 3.95*  CALCIUM 9.9 9.6 8.2* 7.9* 7.7*  MG  --  2.5*  --  2.3  --   PHOS  --   --   --  6.3*  --    GFR: Estimated Creatinine Clearance: 7.2 mL/min (A) (by C-G formula based on SCr of 3.95 mg/dL (H)). Liver Function Tests: Recent Labs  Lab 07/18/23 1702 07/20/23 0829  AST 27  --   ALT 16  --   ALKPHOS 57  --   BILITOT 1.0  --   PROT 7.1  --   ALBUMIN 3.9 2.8*   Recent Results (from the past 240 hour(s))  SARS Coronavirus 2 by RT PCR (hospital order, performed in [facility] hospital  lab) *cepheid single result test* Anterior Nasal Swab     Status: None   Collection Time: 07/18/23  6:32 PM   Specimen: Anterior Nasal Swab  Result Value Ref Range Status   SARS Coronavirus 2 by RT PCR NEGATIVE NEGATIVE Final    Comment: (NOTE) SARS-CoV-2 target nucleic acids are NOT DETECTED.  The SARS-CoV-2 RNA is generally detectable in upper and lower respiratory specimens during the acute phase of infection. The lowest concentration of SARS-CoV-2 viral copies this assay can detect is 250 copies / mL. A negative result does not preclude SARS-CoV-2 infection and should not be used as the sole basis for treatment or other patient management decisions.  A negative result may occur with improper specimen collection / handling, submission of specimen other than nasopharyngeal swab, presence of viral mutation(s) within the areas targeted by this assay, and inadequate number of viral copies (<250 copies / mL). A negative result must be combined with clinical observations, patient history, and epidemiological information.  Fact Sheet for Patients:   RoadLapTop.co.za  Fact Sheet for Healthcare Providers: http://kim-miller.com/  This test is not yet approved or  cleared by the [place] FDA and has been authorized for detection and/or diagnosis of SARS-CoV-2 by FDA under an Emergency Use  Authorization (EUA).  This EUA will remain in effect (meaning this test can be used) for the duration of the COVID-19 declaration under Section 564(b)(1) of the Act, 21 U.S.C. section 360bbb-3(b)(1), unless the authorization is terminated or revoked sooner.  Performed at [facility], [address], [place], [place] [address]   MRSA Next Gen by PCR, Nasal     Status: None   Collection Time: 07/19/23  4:51 PM   Specimen: Nasal Mucosa; Nasal Swab  Result Value Ref Range Status   MRSA by PCR Next Gen NOT DETECTED NOT DETECTED Final    Comment: (NOTE) The GeneXpert MRSA Assay (FDA approved for NASAL specimens only), is one component of a comprehensive MRSA colonization surveillance program. It is not intended to diagnose MRSA infection nor to guide or monitor treatment for MRSA infections. Test performance is not FDA approved in patients less than 58 years old. Performed at [facility], [address] [place], [place] [address]    Scheduled Meds:  docusate sodium  100 mg Oral BID   heparin injection (subcutaneous)  5,000 Units Subcutaneous Q8H   hydrALAZINE  50 mg Oral TID   insulin aspart  0-6 Units Subcutaneous TID WC   isosorbide mononitrate  30 mg Oral Daily   Continuous Infusions:  famotidine (PEPCID) IV 20 mg (07/21/23 2124)    LOS: 4 days   Shon Hale M.D on 07/22/2023 at 5:52 PM  Go to [email or web address] - for contact info  [facility] - Office  [phone]  If 7PM-7AM, please contact night-coverage [email or web address] 07/22/2023, 5:52 PM

## 2023-07-22 NOTE — Progress Notes (Signed)
[facility]  Doing well. Has ambulated and worked with PT. Had a BM.  BP (!) 179/71   Pulse 95   Temp 98.4 F (36.9 C) (Oral)   Resp 18   Ht 5' (1.524 m)   Wt 48.5 kg   SpO2 99%   BMI 20.88 kg/m  Soft, right groin with honeycomb, appropriately tender, less distended  Patient s/p open femoral hernia repair, [place], SBR for incarcerated hernia with obstruction and ischemic bowel. Doing well. PRN for pain IS, OOB Clear diet for now  Algis Greenhouse, MD [facility] [address] [place], [place] [address] [phone] (office)

## 2023-07-22 NOTE — Evaluation (Signed)
Physical Therapy Evaluation Patient Details Name: Emily Jennings MRN: [id number] DOB: Feb 24, 1934 Today's Date: 07/22/2023  History of Present Illness  89 yo with past medical history relevant for CKD 4, diastolic CHF, CAD, DM2, , GERD, and HTN-admitted on 07/18/2023 with small bowel obstruction in the setting of incarcerated left inguinal hernia  -Status post primary repair of femoral inguinal hernia, small bowel resection and primary side-to-side anastomosis on 07/19/2023  Clinical Impression   Pt tolerated today's Physical Therapy Evaluation, well with good response for in room mobility, pt wishing to remain in room. Pt demonstrating mild deficits in functional level as noted by increased supervision during transfers and ambulation due to muscle weakness and balance deficits increasing risk for falls during upright mobility. Based upon these deficits/impairments, pt would benefit from skilled acute physical therapy services to address the above deficits and improve their functional status.PT recommends pt discharge to Home with HHPT services in order to improve pt's functional status, safety and independence with functional mobility and overall QOL.             If plan is discharge home, recommend the following: A little help with walking and/or transfers;A little help with bathing/dressing/bathroom;Help with stairs or ramp for entrance;Assistance with cooking/housework   Can travel by private vehicle        Equipment Recommendations None recommended by PT  Recommendations for Other Services       Functional Status Assessment Patient has had a recent decline in their functional status and demonstrates the ability to make significant improvements in function in a reasonable and predictable amount of time.     Precautions / Restrictions Precautions Precautions: Fall Restrictions Weight Bearing Restrictions: No      Mobility  Bed Mobility               General bed mobility  comments: Received in recliner Patient Response: Cooperative  Transfers Overall transfer level: Needs assistance Equipment used: Rolling walker (2 wheels) Transfers: Sit to/from Stand Sit to Stand: Supervision           General transfer comment: Sit/stand @ supervision level from recliner, powerup to stand with BUE on rails. supervision for balance.    Ambulation/Gait Ambulation/Gait assistance: Supervision, Contact guard assist Gait Distance (Feet): 20 Feet Assistive device: Rolling walker (2 wheels) Gait Pattern/deviations: Decreased step length - right, Decreased step length - left, Decreased stride length Gait velocity: decreased     General Gait Details: slow, cadence with RW, poor balance reactions with RW guidance with turning around @ 10 feet. show small staggered steps.  Stairs            Wheelchair Mobility     Tilt Bed Tilt Bed Patient Response: Cooperative  Modified Rankin (Stroke Patients Only)       Balance Overall balance assessment: Needs assistance Sitting-balance support: Feet supported, No upper extremity supported Sitting balance-Leahy Scale: Good Sitting balance - Comments: seated at EOB   Standing balance support: During functional activity, Single extremity supported Standing balance-Leahy Scale: Fair Standing balance comment: fair/good using RW                             Pertinent Vitals/Pain Pain Assessment Pain Assessment: Faces Faces Pain Scale: No hurt    Home Living Family/patient expects to be discharged to:: Private residence Living Arrangements: Spouse/significant other Available Help at Discharge: Family;Available 24 hours/day Type of Home: House Home Access: Stairs to enter Entrance Stairs-Rails: None  Entrance Stairs-Number of Steps: 1   Home Layout: One level Home Equipment: Cane - single point;Shower Counsellor (2 wheels)      Prior Function Prior Level of Function : Independent/Modified  Independent             Mobility Comments: household ambulation using SPC ADLs Comments: independent ADls, light IADls and med mgmt (she reports not driving recently)     Extremity/Trunk Assessment   Upper Extremity Assessment Upper Extremity Assessment: Overall WFL for tasks assessed    Lower Extremity Assessment Lower Extremity Assessment: Generalized weakness    Cervical / Trunk Assessment Cervical / Trunk Assessment: Kyphotic  Communication   Communication Communication: No apparent difficulties Cueing Techniques: Verbal cues  Cognition Arousal: Alert Behavior During Therapy: WFL for tasks assessed/performed Overall Cognitive Status: Within Functional Limits for tasks assessed                                 General Comments: A & O x 3; unable to orient to date        General Comments      Exercises     Assessment/Plan    PT Assessment Patient needs continued PT services  PT Problem List Decreased strength;Decreased activity tolerance;Decreased balance;Decreased mobility       PT Treatment Interventions Gait training;Functional mobility training;Therapeutic activities;Therapeutic exercise;Balance training;Neuromuscular re-education    PT Goals (Current goals can be found in the Care Plan section)  Acute Rehab PT Goals Patient Stated Goal: go hme PT Goal Formulation: With patient Time For Goal Achievement: 08/05/23 Potential to Achieve Goals: Good    Frequency Min 2X/week     Co-evaluation               AM-PAC PT "6 Clicks" Mobility  Outcome Measure Help needed turning from your back to your side while in a flat bed without using bedrails?: None Help needed moving from lying on your back to sitting on the side of a flat bed without using bedrails?: None Help needed moving to and from a bed to a chair (including a wheelchair)?: None Help needed standing up from a chair using your arms (e.g., wheelchair or bedside chair)?: A  Little Help needed to walk in hospital room?: A Little Help needed climbing 3-5 steps with a railing? : A Little 6 Click Score: 21    End of Session Equipment Utilized During Treatment: Gait belt Activity Tolerance: Patient tolerated treatment well Patient left: in chair;with call bell/phone within reach Nurse Communication: Mobility status PT Visit Diagnosis: Unsteadiness on feet (R26.81);Other abnormalities of gait and mobility (R26.89);Muscle weakness (generalized) (M62.81)    Time: 1610-9604 PT Time Calculation (min) (ACUTE ONLY): 20 min   Charges:   PT Evaluation $PT Eval Low Complexity: 1 Low   PT General Charges $$ ACUTE PT VISIT: 1 Visit         Nelida Meuse PT, DPT Physical Therapist with [facility] [phone] office  Nelida Meuse 07/22/2023, 11:01 AM

## 2023-07-22 NOTE — Plan of Care (Signed)
  Problem: Acute Rehab PT Goals(only PT should resolve) Goal: Patient Will Transfer Sit To/From Stand Flowsheets (Taken 07/22/2023 1103) Patient will transfer sit to/from stand: Independently Goal: Pt Will Perform Standing Balance Or Pre-Gait Flowsheets (Taken 07/22/2023 1103) Pt will perform standing balance or pre-gait: Independently Goal: Pt Will Ambulate Flowsheets (Taken 07/22/2023 1103) Pt will Ambulate:  50 feet  75 feet  with modified independence  with least restrictive assistive device  Nelida Meuse PT, DPT Physical Therapist with [facility] 336 [phone] office

## 2023-07-23 ENCOUNTER — Ambulatory Visit (HOSPITAL_COMMUNITY): Payer: Medicare Other

## 2023-07-23 DIAGNOSIS — K4030 Unilateral inguinal hernia, with obstruction, without gangrene, not specified as recurrent: Secondary | ICD-10-CM | POA: Diagnosis not present

## 2023-07-23 LAB — RENAL FUNCTION PANEL
Albumin: 2.4 g/dL — ABNORMAL LOW (ref 3.5–5.0)
Anion gap: 6 (ref 5–15)
BUN: 73 mg/dL — ABNORMAL HIGH (ref 8–23)
CO2: 28 mmol/L (ref 22–32)
Calcium: 8.6 mg/dL — ABNORMAL LOW (ref 8.9–10.3)
Chloride: 100 mmol/L (ref 98–111)
Creatinine, Ser: 2.82 mg/dL — ABNORMAL HIGH (ref 0.44–1.00)
GFR, Estimated: 16 mL/min — ABNORMAL LOW (ref >60)
Glucose, Bld: 150 mg/dL — ABNORMAL HIGH (ref 70–99)
Phosphorus: 2.7 mg/dL (ref 2.5–4.6)
Potassium: 3.4 mmol/L — ABNORMAL LOW (ref 3.5–5.1)
Sodium: 134 mmol/L — ABNORMAL LOW (ref 135–145)

## 2023-07-23 LAB — GLUCOSE, CAPILLARY
Glucose-Capillary: 138 mg/dL — ABNORMAL HIGH (ref 70–99)
Glucose-Capillary: 142 mg/dL — ABNORMAL HIGH (ref 70–99)
Glucose-Capillary: 167 mg/dL — ABNORMAL HIGH (ref 70–99)
Glucose-Capillary: 184 mg/dL — ABNORMAL HIGH (ref 70–99)
Glucose-Capillary: 201 mg/dL — ABNORMAL HIGH (ref 70–99)
Glucose-Capillary: 329 mg/dL — ABNORMAL HIGH (ref 70–99)

## 2023-07-23 LAB — CBC
HCT: 23.7 % — ABNORMAL LOW (ref 36.0–46.0)
Hemoglobin: 7.6 g/dL — ABNORMAL LOW (ref 12.0–15.0)
MCH: 30.4 pg (ref 26.0–34.0)
MCHC: 32.1 g/dL (ref 30.0–36.0)
MCV: 94.8 fL (ref 80.0–100.0)
Platelets: 185 10*3/uL (ref 150–400)
RBC: 2.5 MIL/uL — ABNORMAL LOW (ref 3.87–5.11)
RDW: 13.3 % (ref 11.5–15.5)
WBC: 6.5 10*3/uL (ref 4.0–10.5)
nRBC: 0 % (ref 0.0–0.2)

## 2023-07-23 LAB — SURGICAL PATHOLOGY

## 2023-07-23 MED ORDER — NIFEDIPINE ER OSMOTIC RELEASE 30 MG PO TB24
60.0000 mg | ORAL_TABLET | Freq: Every day | ORAL | Status: DC
  Administered 2023-07-23 – 2023-07-25 (×3): 60 mg via ORAL
  Filled 2023-07-23 (×3): qty 2

## 2023-07-23 MED ORDER — OXYCODONE HCL 5 MG PO TABS: 5.0000 mg | ORAL_TABLET | ORAL | Status: DC | PRN

## 2023-07-23 MED ORDER — HYDRALAZINE HCL 25 MG PO TABS
100.0000 mg | ORAL_TABLET | Freq: Three times a day (TID) | ORAL | Status: DC
  Administered 2023-07-23 – 2023-07-25 (×6): 100 mg via ORAL
  Filled 2023-07-23 (×6): qty 4

## 2023-07-23 MED ORDER — POTASSIUM CHLORIDE CRYS ER 20 MEQ PO TBCR
40.0000 meq | EXTENDED_RELEASE_TABLET | ORAL | Status: AC
  Administered 2023-07-23 (×2): 40 meq via ORAL
  Filled 2023-07-23 (×2): qty 2

## 2023-07-23 NOTE — Progress Notes (Signed)
PROGRESS NOTE  Emily Jennings, is a 89 y.o. female, DOB - [birth year], WJX:[id number]  Admit date - 07/18/2023   Admitting Physician [name] Mariea Clonts, MD  Outpatient Primary MD for the patient is [name], MD  LOS - 5  Chief Complaint  Patient presents with   Abdominal Pain      Brief Narrative:  89 yo with past medical history relevant for CKD 4, diastolic CHF, CAD, DM2, , GERD, and HTN-admitted on 07/18/2023 with small bowel obstruction in the setting of incarcerated left inguinal hernia -Status post primary repair of femoral inguinal hernia, small bowel resection and primary side-to-side anastomosis on 07/19/2023   awaiting return of bowel function and tolerance of oral intake prior to discharge   -Assessment and Plan: 1)Incarcerated left inguinal hernia---s/p surgical repair -Clinically and radiologically patient had incarcerated left inguinal hernia with high-grade small bowel obstruction -General Surgery consult from Dr. Henreitta Leber appreciated  -patient is status post  primary repair of femoral inguinal hernia, small bowel resection and primary side-to-side anastomosis on 07/19/2023 -NG tube out 07/20/23 -Continue as needed morphine sulfate IV for pain control and as needed Zofran for nausea -Diet advanced by general surgery -Having BMs -possible discharge home in the a.m. if tolerates soft diet well Further management per general surgery team  2)Acute kidney injury superimposed on chronic kidney disease IV Baseline creatinine usually between 2.6-3  -creatinine elevated at 4.56 on admission,  -Hold diuretics -Creatinine trending down--- currently down to 2.82  3) acute on chronic hyponatremia - Close to baseline 125-131. - c/n to Hydrate  4)Diastolic dysfunction CHF (congestive heart failure) (HCC) -Stable and compensated.  Last echo 04/2023 EF of 65 to 70% grade 2 DD. GDMT limited by CKD 4,  -Hold Lasix with significant AKI on CKD  5)Uncontrolled type 2 diabetes  mellitus --A1c 7.5 reflecting uncontrolled DM with hyperglycemia PTA  -Hold home Tresiba, Farxiga until oral intake is more reliable -Episode of symptomatic hypoglycemia on 07/21/2023, insulin adjusted Use Novolog/Humalog Sliding scale insulin with Accu-Cheks/Fingersticks as ordered   6)HTN -Hold spironolactone, nifedipine, carvedilol, Lasix, hydralazine 100 mg -Okay to use IV hydralazine as needed elevated BP  7)1.7 cm Left Thyroid Nodule---Recommend thyroid US as outpt   8)HLD--- - resume statin when oral intake resumes    9)GERD (gastroesophageal reflux disease) - -Protonix as ordered   10)Chronic anemia--in the setting of CKD 4  Anticipate drop in H&H in the postoperative period  11) hypokalemia----replace and recheck   Status is: Inpatient   Disposition: The patient is from: Home------- possible discharge home in the a.m. if tolerates soft diet well             anticipated d/c is to: Home              Anticipated d/c date is: 2 days              Patient currently is not medically stable to d/c. Barriers: Not Clinically Stable-   Code Status :  -  Code Status: Full Code   Family Communication:    Discussed with husband at bedside  DVT Prophylaxis  :   - SCDs   heparin injection 5,000 Units Start: 07/20/23 2200 SCDs Start: 07/19/23 0142   Lab Results  Component Value Date   PLT 185 07/23/2023   Inpatient Medications  Scheduled Meds:  heparin injection (subcutaneous)  5,000 Units Subcutaneous Q8H   hydrALAZINE  50 mg Oral TID   insulin aspart  0-5 Units Subcutaneous QHS  insulin aspart  0-9 Units Subcutaneous TID WC   insulin glargine-yfgn  5 Units Subcutaneous QHS   isosorbide mononitrate  30 mg Oral Daily   Continuous Infusions:  famotidine (PEPCID) IV Stopped (07/23/23 0820)   PRN Meds:.acetaminophen **OR** acetaminophen, labetalol, morphine injection, ondansetron **OR** ondansetron (ZOFRAN) IV, oxyCODONE   Anti-infectives (From admission, onward)     Start     Dose/Rate Route Frequency Ordered Stop   07/19/23 1300  ceFAZolin (ANCEF) IVPB 2g/100 mL premix        2 g 200 mL/hr over 30 Minutes Intravenous On call to O.R. 07/19/23 1158 07/19/23 1427       Subjective: [name] today has no fevers, no emesis,  No chest pain,   - -Eating and drinking well -Having BMs -possible discharge home in the a.m. if tolerates soft diet well   Objective: Vitals:   07/23/23 0529 07/23/23 0810 07/23/23 0819 07/23/23 1316  BP: (!) 185/80 (!) 173/81 (!) 173/81 (!) 168/74  Pulse: 83 79  90  Resp: 16     Temp: 98.6 F (37 C)   98.3 F (36.8 C)  TempSrc: Oral     SpO2: 99% 100%  99%  Weight:      Height:        Intake/Output Summary (Last 24 hours) at 07/23/2023 1404 Last data filed at 07/23/2023 0800 Gross per 24 hour  Intake 780 ml  Output --  Net 780 ml   Filed Weights   07/18/23 1602 07/19/23 1236 07/19/23 1700  Weight: 46.3 kg 46.3 kg 48.5 kg    Physical Exam  Gen:- Awake Alert, no acute distress HEENT:- [place].AT, No sclera icterus Nose--NG tube removed on 07/20/23 Neck-Supple Neck,No JVD,.  Lungs-  CTAB , fair symmetrical air movement CV- S1, S2 normal, regular  Abd-  +ve B.Sounds, Abd Soft, much improved postoperative tenderness, left inguinal area postoperative wound with honeycomb dressing Extremity/Skin:- No  edema, pedal pulses present  Psych-affect is appropriate, oriented x3 Neuro-no new focal deficits, no tremors  Data Reviewed: I have personally reviewed following labs and imaging studies  CBC: Recent Labs  Lab 07/18/23 1702 07/19/23 0559 07/20/23 0829 07/21/23 0505 07/23/23 0448  WBC 7.6 6.4 6.3 7.3 6.5  NEUTROABS 6.2  --   --  6.6  --   HGB 11.3* 9.1* 9.6* 8.3* 7.6*  HCT 33.3* 28.2* 29.1* 25.3* 23.7*  MCV 91.7 92.8 92.7 93.4 94.8  PLT 314 236 250 186 185   Basic Metabolic Panel: Recent Labs  Lab 07/18/23 1702 07/19/23 0559 07/20/23 0829 07/21/23 0505 07/23/23 0448  NA 127* 122* 126* 130* 134*   K 4.4 3.8 3.4* 3.5 3.4*  CL 79* 81* 85* 92* 100  CO2 32 23 20* 21* 28  GLUCOSE 86 317* 368* 301* 150*  BUN 77* 82* 88* 88* 73*  CREATININE 4.56* 4.51* 4.69* 3.95* 2.82*  CALCIUM 9.6 8.2* 7.9* 7.7* 8.6*  MG 2.5*  --  2.3  --   --   PHOS  --   --  6.3*  --  2.7   GFR: Estimated Creatinine Clearance: 10.1 mL/min (A) (by C-G formula based on SCr of 2.82 mg/dL (H)). Liver Function Tests: Recent Labs  Lab 07/18/23 1702 07/20/23 0829 07/23/23 0448  AST 27  --   --   ALT 16  --   --   ALKPHOS 57  --   --   BILITOT 1.0  --   --   PROT 7.1  --   --  ALBUMIN 3.9 2.8* 2.4*   Recent Results (from the past 240 hour(s))  SARS Coronavirus 2 by RT PCR (hospital order, performed in [facility] hospital lab) *cepheid single result test* Anterior Nasal Swab     Status: None   Collection Time: 07/18/23  6:32 PM   Specimen: Anterior Nasal Swab  Result Value Ref Range Status   SARS Coronavirus 2 by RT PCR NEGATIVE NEGATIVE Final    Comment: (NOTE) SARS-CoV-2 target nucleic acids are NOT DETECTED.  The SARS-CoV-2 RNA is generally detectable in upper and lower respiratory specimens during the acute phase of infection. The lowest concentration of SARS-CoV-2 viral copies this assay can detect is 250 copies / mL. A negative result does not preclude SARS-CoV-2 infection and should not be used as the sole basis for treatment or other patient management decisions.  A negative result may occur with improper specimen collection / handling, submission of specimen other than nasopharyngeal swab, presence of viral mutation(s) within the areas targeted by this assay, and inadequate number of viral copies (<250 copies / mL). A negative result must be combined with clinical observations, patient history, and epidemiological information.  Fact Sheet for Patients:   RoadLapTop.co.za  Fact Sheet for Healthcare Providers: http://kim-miller.com/  This test is  not yet approved or  cleared by the [place] FDA and has been authorized for detection and/or diagnosis of SARS-CoV-2 by FDA under an Emergency Use Authorization (EUA).  This EUA will remain in effect (meaning this test can be used) for the duration of the COVID-19 declaration under Section 564(b)(1) of the Act, 21 U.S.C. section 360bbb-3(b)(1), unless the authorization is terminated or revoked sooner.  Performed at [facility], [address] [place], [place] [address]   MRSA Next Gen by PCR, Nasal     Status: None   Collection Time: 07/19/23  4:51 PM   Specimen: Nasal Mucosa; Nasal Swab  Result Value Ref Range Status   MRSA by PCR Next Gen NOT DETECTED NOT DETECTED Final    Comment: (NOTE) The GeneXpert MRSA Assay (FDA approved for NASAL specimens only), is one component of a comprehensive MRSA colonization surveillance program. It is not intended to diagnose MRSA infection nor to guide or monitor treatment for MRSA infections. Test performance is not FDA approved in patients less than 17 years old. Performed at [facility], [address], [place], [place] [address]    Scheduled Meds:  heparin injection (subcutaneous)  5,000 Units Subcutaneous Q8H   hydrALAZINE  50 mg Oral TID   insulin aspart  0-5 Units Subcutaneous QHS   insulin aspart  0-9 Units Subcutaneous TID WC   insulin glargine-yfgn  5 Units Subcutaneous QHS   isosorbide mononitrate  30 mg Oral Daily   Continuous Infusions:  famotidine (PEPCID) IV Stopped (07/23/23 0820)    LOS: 5 days   Shon Hale M.D on 07/23/2023 at 2:04 PM  Go to [email or web address] - for contact info  [facility] - Office  [phone]  If 7PM-7AM, please contact night-coverage [email or web address] 07/23/2023, 2:04 PM

## 2023-07-23 NOTE — Progress Notes (Signed)
[facility]  Looking great this AM. Feeling great.   BP (!) 173/81   Pulse 79   Temp 98.6 F (37 C) (Oral)   Resp 16   Ht 5' (1.524 m)   Wt 48.5 kg   SpO2 100%   BMI 20.88 kg/m  Soft, non-distended, groin swelling but no erythema or drainage, staples c/d/I   Patient s/p  s/p open femoral hernia repair, [place], SBR for incarcerated hernia with obstruction and ischemic bowel. Doing well. PRN for pain IS, OOB Soft diet Having multiple loose Bms, stopped the colace Groin swelling is present from the repair but everything looks good otherwise Will not be able to lift > 10 lbs for 8 weeks to be safe given primary repair, risk of recurrence  Algis Greenhouse, MD [facility] [address] [place] [place] [place], [place] [address] [phone] (office)

## 2023-07-23 NOTE — Plan of Care (Signed)
  Problem: Clinical Measurements: Goal: Respiratory complications will improve Outcome: Progressing Goal: Cardiovascular complication will be avoided Outcome: Progressing   Problem: Nutrition: Goal: Adequate nutrition will be maintained Outcome: Progressing   Problem: Coping: Goal: Level of anxiety will decrease Outcome: Progressing   Problem: Elimination: Goal: Will not experience complications related to bowel motility Outcome: Progressing   Problem: Pain Management: Goal: General experience of comfort will improve Outcome: Progressing

## 2023-07-23 NOTE — Progress Notes (Addendum)
Yellow mews triggered. Pt denies s/sx. Pt is not in any distress. Yellow mew protocol initiated.     New orders placed at 07/23/2023 at 1845.    07/23/23 1825  Assess: MEWS Score  Temp 98 F (36.7 C)  BP (!) 209/82  MAP (mmHg) 117  Pulse Rate 92  Resp 20  SpO2 99 %  O2 Device Room Air  Assess: MEWS Score  MEWS Temp 0  MEWS Systolic 2  MEWS Pulse 0  MEWS RR 0  MEWS LOC 0  MEWS Score 2  MEWS Score Color Yellow  Assess: if the MEWS score is Yellow or Red  Were vital signs accurate and taken at a resting state? Yes  Does the patient meet 2 or more of the SIRS criteria? No  MEWS guidelines implemented  Yes, yellow  Treat  MEWS Interventions Considered administering scheduled or prn medications/treatments as ordered  Take Vital Signs  Increase Vital Sign Frequency  Yellow: Q2hr x1, continue Q4hrs until patient remains green for 12hrs  Escalate  MEWS: Escalate Yellow: Discuss with charge nurse and consider notifying provider and/or RRT  Notify: Charge Nurse/RN  Name of Charge Nurse/RN Notified Janan Ridge, RN  Provider Notification  Provider Name/Title Dr. Shon Hale  Date Provider Notified 07/23/23  Time Provider Notified 1841  Method of Notification Page  Notification Reason Other (Comment) (yellow mews)  Provider response No new orders  Date of Provider Response 07/23/23  Time of Provider Response 1842  Assess: SIRS CRITERIA  SIRS Temperature  0  SIRS Pulse 1  SIRS Respirations  0  SIRS WBC 0  SIRS Score Sum  1

## 2023-07-23 NOTE — Progress Notes (Signed)
Mobility Specialist Progress Note:    07/23/23 0930  Mobility  Activity Ambulated with assistance in room;Ambulated with assistance to bathroom  Level of Assistance Standby assist, set-up cues, supervision of patient - no hands on  Assistive Device Front wheel walker  Distance Ambulated (ft) 30 ft  Range of Motion/Exercises Active;All extremities  Activity Response Tolerated well  Mobility Referral Yes  $Mobility charge 1 Mobility  Mobility Specialist Start Time (ACUTE ONLY) 0930  Mobility Specialist Stop Time (ACUTE ONLY) 0945  Mobility Specialist Time Calculation (min) (ACUTE ONLY) 15 min   Pt received in chair, agreeable to mobility. Required SBA to stand and ambulate with RW. Tolerated well, asx throughout. Returned pt to chair, all needs met.   Lawerance Bach Mobility Specialist Please contact via Special educational needs teacher or  Rehab office at [phone]

## 2023-07-23 NOTE — TOC Progression Note (Signed)
Transition of Care [facility]) - Progression Note    Patient Details  Name: LILIANN FILE MRN: [id number] Date of Birth: [birth year]  [facility]) CM/SW Contact  Veronique Warga A Rocky Gladden, RN Phone Number: 07/23/2023, 2:21 PM  Clinical Narrative:    Transition of Care Department [facility]) has reviewed patient and no TOC needs have been identified at this time.  Plans are to discharge tomorrow if able to tolerate diet. We will continue to monitor patient advancement through interdisciplinary progression rounds. If new patient transition needs arise, please place a TOC consult.   Expected Discharge Plan: Home w Home Health Services Barriers to Discharge: Continued Medical Work up  Expected Discharge Plan and Services       Living arrangements for the past 2 months: Single Family Home                                       Social Determinants of Health (SDOH) Interventions SDOH Screenings   Food Insecurity: No Food Insecurity (07/18/2023)  Housing: Low Risk  (07/18/2023)  Transportation Needs: No Transportation Needs (07/18/2023)  Utilities: Not At Risk (07/18/2023)  Alcohol Screen: Low Risk  (11/15/2022)  Depression (PHQ2-9): High Risk (07/16/2023)  Financial Resource Strain: Low Risk  (12/28/2022)  Physical Activity: Sufficiently Active (11/15/2022)  Social Connections: Moderately Integrated (11/15/2022)  Stress: No Stress Concern Present (12/28/2022)  Tobacco Use: Low Risk  (07/19/2023)    Readmission Risk Interventions    07/19/2023    2:27 PM 07/04/2023    8:27 AM 06/15/2023   12:46 PM  Readmission Risk Prevention Plan  Transportation Screening Complete Complete Complete  Medication Review Oceanographer) Complete Complete Complete  PCP or Specialist appointment within 3-5 days of discharge Not Complete  Not Complete  HRI or Home Care Consult Complete Complete Complete  SW Recovery Care/Counseling Consult Complete Complete Complete  Palliative Care Screening Not  Applicable Not Applicable Not Applicable  Skilled Nursing Facility Not Applicable Not Applicable Not Applicable

## 2023-07-24 ENCOUNTER — Other Ambulatory Visit: Payer: Self-pay | Admitting: *Deleted

## 2023-07-24 DIAGNOSIS — K4030 Unilateral inguinal hernia, with obstruction, without gangrene, not specified as recurrent: Secondary | ICD-10-CM

## 2023-07-24 LAB — GLUCOSE, CAPILLARY
Glucose-Capillary: 237 mg/dL — ABNORMAL HIGH (ref 70–99)
Glucose-Capillary: 270 mg/dL — ABNORMAL HIGH (ref 70–99)
Glucose-Capillary: 288 mg/dL — ABNORMAL HIGH (ref 70–99)
Glucose-Capillary: 293 mg/dL — ABNORMAL HIGH (ref 70–99)
Glucose-Capillary: 355 mg/dL — ABNORMAL HIGH (ref 70–99)
Glucose-Capillary: 391 mg/dL — ABNORMAL HIGH (ref 70–99)
Glucose-Capillary: 405 mg/dL — ABNORMAL HIGH (ref 70–99)

## 2023-07-24 NOTE — Progress Notes (Signed)
[facility]  Went over plan for packing wound with iodoform wicks with daughter and husband. They are understanding. Plan to dc tomorrow after we see wound one more day. Rns report the redness on the skin was much improved.   Algis Greenhouse, MD [facility] [address] [place] [place], [place] [address] [phone] (office)

## 2023-07-24 NOTE — Progress Notes (Signed)
Physical Therapy Treatment Patient Details Name: Emily Jennings MRN: [id number] DOB: [birth year] Today's Date: 07/24/2023   History of Present Illness 89 yo with past medical history relevant for CKD 4, diastolic CHF, CAD, DM2, , GERD, and HTN-admitted on 07/18/2023 with small bowel obstruction in the setting of incarcerated left inguinal hernia  -Status post primary repair of femoral inguinal hernia, small bowel resection and primary side-to-side anastomosis on 07/19/2023    PT Comments  Pt with increased time/direction with transfers.  Tends to pull with walker rather than push self up.  Pt with forward bent posturing with ambulation taking small steps with RW.  Pt returned to chair and complete bil LE exercises.  No pain or issues during or at end of session.  Pt remained in chair with call bell.      If plan is discharge home, recommend the following: A little help with walking and/or transfers;A little help with bathing/dressing/bathroom;Help with stairs or ramp for entrance;Assistance with cooking/housework   Can travel by private vehicle        Equipment Recommendations  None recommended by PT       Precautions / Restrictions Precautions Precautions: Fall Restrictions Weight Bearing Restrictions: No     Mobility  Bed Mobility Overal bed mobility: Modified Independent             General bed mobility comments: pt uses bed rail and is slower to obtain seated position    Transfers Overall transfer level: Needs assistance Equipment used: Rolling walker (2 wheels) Transfers: Sit to/from Stand Sit to Stand: Supervision           General transfer comment: Sit/stand @ supervision level from recliner, powerup to stand with BUE on rails. supervision for balance.    Ambulation/Gait Ambulation/Gait assistance: Supervision, Contact guard assist Gait Distance (Feet): 50 Feet Assistive device: Rolling walker (2 wheels) Gait Pattern/deviations: Decreased step length - right,  Decreased step length - left, Decreased stride length Gait velocity: decreased     General Gait Details: slow, cadence with RW,forward bent posturing, small stride            Cognition Arousal: Alert Behavior During Therapy: WFL for tasks assessed/performed Overall Cognitive Status: Within Functional Limits for tasks assessed                                 General Comments: A & O x 3; unable to orient to date        Exercises General Exercises - Lower Extremity Ankle Circles/Pumps: AROM, Both, 10 reps, Seated Long Arc Quad: AROM, Both, 10 reps, Seated Hip Flexion/Marching: AROM, Both, 10 reps, Seated        Pertinent Vitals/Pain Pain Assessment Pain Assessment: No/denies pain           PT Goals (current goals can now be found in the care plan section)      Frequency    Min 2X/week      PT Plan  continue    AM-PAC PT "6 Clicks" Mobility   Outcome Measure  Help needed turning from your back to your side while in a flat bed without using bedrails?: None Help needed moving from lying on your back to sitting on the side of a flat bed without using bedrails?: None Help needed moving to and from a bed to a chair (including a wheelchair)?: None Help needed standing up from a chair using your arms (e.g., wheelchair  or bedside chair)?: A Little Help needed to walk in hospital room?: A Little Help needed climbing 3-5 steps with a railing? : A Little 6 Click Score: 21    End of Session Equipment Utilized During Treatment: Gait belt Activity Tolerance: Patient tolerated treatment well Patient left: in chair;with call bell/phone within reach Nurse Communication: Mobility status PT Visit Diagnosis: Unsteadiness on feet (R26.81);Other abnormalities of gait and mobility (R26.89);Muscle weakness (generalized) (M62.81)     Time: 1115-1140 PT Time Calculation (min) (ACUTE ONLY): 25 min  Charges:    $Gait Training: 8-22 mins $Therapeutic Exercise:  8-22 mins PT General Charges $$ ACUTE PT VISIT: 1 Visit                     Lurena [name], PTA/CLT [facility] Ph: [phone]    Lurena [name] 07/24/2023, 11:56 AM

## 2023-07-24 NOTE — Consult Note (Signed)
[facility] Note  07/24/2023  [name] [birth year] [id number]  [facility] screened the patient remotely at [facility].  Insurance: [employer]   Emily Jennings is a 89 y.o. female who is a Primary Care Patient of [name], MD-[facility]. The patient was screened for 30 day readmission hospitalization with noted extreme risk score for unplanned readmission risk with 6 IP/2 ED in 6 months.  The patient was assessed for potential Care Management service needs for post hospital transition for care coordination. Review of patient's electronic medical record reveals patient was admitted Incarcerated left inguinal hernia. Jennings attempted outreach call however unsuccessful and unable to leave a message. Will make a referral for post hospital prevention readmission and care management services related to extreme high risk for readmissions.   Plan: Emily Jennings will continue to follow progress and disposition to asess for post hospital community care coordination/management needs.  Referral request for community care coordination: Will make a referral for care management services post hospital prevention readmission follow up.   VBCI Care Management/Population Health does not replace or interfere with any arrangements made by the Inpatient Transition of Care team.   For questions contact:   Elliot Cousin, RN, [facility] [place]   [facility], Population Health Office Hours MTWF  8:00 am-6:00 pm Direct Dial: [phone] mobile [phone] [Office toll free line] Office Hours are M-F 8:30 - 5 pm [email or web address]

## 2023-07-24 NOTE — Progress Notes (Signed)
Mobility Specialist Progress Note:    07/24/23 0940  Mobility  Activity Ambulated with assistance in room;Ambulated with assistance to bathroom  Level of Assistance Contact guard assist, steadying assist  Assistive Device Front wheel walker  Distance Ambulated (ft) 30 ft  Range of Motion/Exercises Active;All extremities  Activity Response Tolerated well  Mobility Referral Yes  $Mobility charge 1 Mobility  Mobility Specialist Start Time (ACUTE ONLY) 0940  Mobility Specialist Stop Time (ACUTE ONLY) 1000  Mobility Specialist Time Calculation (min) (ACUTE ONLY) 20 min   Pt received in chair, agreeable to mobility. Required CGA to stand and ambulate with RW. Tolerated well, pt unsteady during ambulation. Pt requested to be put back in bed, left supine. Alarm on, all needs met.   Lawerance Bach Mobility Specialist Please contact via Special educational needs teacher or  Rehab office at [phone]

## 2023-07-24 NOTE — Progress Notes (Signed)
[facility]  Doing well. Eating soft diet. Having Bms.   BP (!) 157/67   Pulse (!) 104   Temp 97.8 F (36.6 C) (Oral)   Resp 16   Ht 5' (1.524 m)   Wt 48.5 kg   SpO2 100%   BMI 20.88 kg/m  Groin wound with some swelling and erythema. Removed staples and seroma drained out, wick of iodoform X 4 placed between staples    Patient s/p  s/p open femoral hernia repair, [place], SBR for incarcerated hernia with obstruction and ischemic bowel. Doing well. Wound with seroma, and this was evacuated. Will need wick packing between the staples to ensure this does not close too soon.   PRN for pain IS, OOB Soft diet   Will not be able to lift > 10 lbs for 8 weeks to be safe given primary repair, risk of recurrence   Wound Packing Daily for next ~ 5 days or until the skin cannot be packed  Iodoform packing X 4 between staples (wicks), cut iodoform about 4-5cm in length - cut 4 of this size and pack into the incision between the staples, Only pack in slightly into the skin wound 1-2 cm in depth, leaving a long tail that can be pulled out easily, cover with abd and paper tape  RN to see if the Husband can pack, and if not she may need SNF?   Algis Greenhouse, MD [facility] [address] [place], [place] [address] [phone] (office)

## 2023-07-24 NOTE — Inpatient Diabetes Management (Signed)
Inpatient Diabetes Program Recommendations  AACE/ADA: New Consensus Statement on Inpatient Glycemic Control (2015)  Target Ranges:  Prepandial:   less than 140 mg/dL      Peak postprandial:   less than 180 mg/dL (1-2 hours)      Critically ill patients:  140 - 180 mg/dL   Lab Results  Component Value Date   GLUCAP 293 (H) 07/24/2023   HGBA1C 7.5 (A) 05/01/2023    Review of Glycemic Control  Latest Reference Range & Units 07/23/23 16:15 07/23/23 19:50 07/24/23 02:22 07/24/23 05:49 07/24/23 07:19  Glucose-Capillary 70 - 99 mg/dL 696 (H) 295 (H) 284 (H) 270 (H) 293 (H)  (H): Data is abnormally high  Diabetes history: DM2 Outpatient Diabetes medications: Tresiba 5 units every day, Humalog 5 units TID, Farxiga 5 mg QD Current orders for Inpatient glycemic control: Semglee 5 units every day, Novolog 0-9 units TID and 0-5 units QHS  Inpatient Diabetes Program Recommendations:    Might consider obtaining a current A1C  Please consider:  Semglee 5 units BID  Will continue to follow while inpatient.  Thank you, Dulce Sellar, MSN, CDCES Diabetes Coordinator Inpatient Diabetes Program [phone] (team pager from 8a-5p)

## 2023-07-24 NOTE — Progress Notes (Signed)
PROGRESS NOTE  Emily Jennings, is a 89 y.o. female, DOB - [birth year], YQM:[id number]  Admit date - 07/18/2023   Admitting Physician Sanah Kraska Mariea Clonts, MD  Outpatient Primary MD for the patient is [name], MD  LOS - 6  Chief Complaint  Patient presents with   Abdominal Pain      Brief Narrative:  89 yo with past medical history relevant for CKD 4, diastolic CHF, CAD, DM2, , GERD, and HTN-admitted on 07/18/2023 with small bowel obstruction in the setting of incarcerated left inguinal hernia -Status post primary repair of femoral inguinal hernia, small bowel resection and primary side-to-side anastomosis on 07/19/2023  -Assessment and Plan: 1)Incarcerated left inguinal hernia---s/p surgical repair -Clinically and radiologically patient had incarcerated left inguinal hernia with high-grade small bowel obstruction -General Surgery consult from Dr. Henreitta Leber appreciated  -patient is status post  primary repair of femoral inguinal hernia, small bowel resection and primary side-to-side anastomosis on 07/19/2023 -NG tube out 07/20/23 -Continue as needed morphine sulfate IV for pain control and as needed Zofran for nausea -Diet advanced by general surgery -Having BMs  Further management per general surgery team  2)Acute kidney injury superimposed on chronic kidney disease IV Baseline creatinine usually between 2.6-3  -creatinine elevated at 4.56 on admission,  -Hold diuretics -Creatinine trending down--- currently down to 2.82  3) acute on chronic hyponatremia - Close to baseline 125-131. - c/n to Hydrate  4)Diastolic dysfunction CHF (congestive heart failure) (HCC) -Stable and compensated.  Last echo 04/2023 EF of 65 to 70% grade 2 DD. GDMT limited by CKD 4,  -Hold Lasix with significant AKI on CKD  5)Uncontrolled type 2 diabetes mellitus --A1c 7.5 reflecting uncontrolled DM with hyperglycemia PTA  -Hold home Tresiba, Farxiga until oral intake is more reliable -Episode of  symptomatic hypoglycemia on 07/21/2023, insulin adjusted Use Novolog/Humalog Sliding scale insulin with Accu-Cheks/Fingersticks as ordered   6)HTN -Hold spironolactone, nifedipine, carvedilol, Lasix, hydralazine 100 mg -Okay to use IV hydralazine as needed elevated BP  7)1.7 cm Left Thyroid Nodule---Recommend thyroid US as outpt   8)HLD--- - resume statin when oral intake resumes    9)GERD (gastroesophageal reflux disease) - -Protonix as ordered   10)Acute on Chronic Anemia--in the setting of CKD 4  Anticipate drop in H&H in the postoperative period -Drop in H&H is most likely due to hemodilution/IV fluids  11) hypokalemia----replace and recheck   Status is: Inpatient   Disposition: The patient is from: Home------- possible discharge home in the a.m.             anticipated d/c is to: Home              Anticipated d/c date is: 1 day              Patient currently is not medically stable to d/c. Barriers: Not Clinically Stable-   Code Status :  -  Code Status: Full Code   Family Communication:    Discussed with husband at bedside  DVT Prophylaxis  :   - SCDs   heparin injection 5,000 Units Start: 07/20/23 2200 SCDs Start: 07/19/23 0142   Lab Results  Component Value Date   PLT 185 07/23/2023   Inpatient Medications  Scheduled Meds:  heparin injection (subcutaneous)  5,000 Units Subcutaneous Q8H   hydrALAZINE  100 mg Oral TID   insulin aspart  0-5 Units Subcutaneous QHS   insulin aspart  0-9 Units Subcutaneous TID WC   insulin glargine-yfgn  5 Units Subcutaneous QHS  NIFEdipine  60 mg Oral Daily   Continuous Infusions:  famotidine (PEPCID) IV 20 mg (07/23/23 1957)   PRN Meds:.acetaminophen **OR** acetaminophen, labetalol, morphine injection, ondansetron **OR** ondansetron (ZOFRAN) IV, oxyCODONE   Anti-infectives (From admission, onward)    Start     Dose/Rate Route Frequency Ordered Stop   07/19/23 1300  ceFAZolin (ANCEF) IVPB 2g/100 mL premix        2 g 200  mL/hr over 30 Minutes Intravenous On call to O.R. 07/19/23 1158 07/19/23 1427       Subjective: [name] today has no fevers, no emesis,  No chest pain,   - -Eating and drinking well -Having BMs -Husband at bedside---learning how to do dressing change for left inguinal wound     Objective: Vitals:   07/24/23 0548 07/24/23 0832 07/24/23 1309 07/24/23 1649  BP: (!) 141/66 (!) 157/67 (!) 122/53 (!) 131/58  Pulse: (!) 103 (!) 104 96 92  Resp: 16     Temp: 99.1 F (37.3 C) 97.8 F (36.6 C) 98 F (36.7 C)   TempSrc: Oral Oral    SpO2: 100% 100% 100% 100%  Weight:      Height:        Intake/Output Summary (Last 24 hours) at 07/24/2023 1814 Last data filed at 07/24/2023 1300 Gross per 24 hour  Intake 680 ml  Output 450 ml  Net 230 ml   Filed Weights   07/18/23 1602 07/19/23 1236 07/19/23 1700  Weight: 46.3 kg 46.3 kg 48.5 kg    Physical Exam  Gen:- Awake Alert, no acute distress HEENT:- [place].AT, No sclera icterus Nose--NG tube removed on 07/20/23 Neck-Supple Neck,No JVD,.  Lungs-  CTAB , fair symmetrical air movement CV- S1, S2 normal, regular  Abd-  +ve B.Sounds, Abd Soft, much improved postoperative tenderness, left inguinal area postoperative wound with   dressing Extremity/Skin:- No  edema, pedal pulses present  Psych-affect is appropriate, oriented x3 Neuro-no new focal deficits, no tremors  Data Reviewed: I have personally reviewed following labs and imaging studies  CBC: Recent Labs  Lab 07/18/23 1702 07/19/23 0559 07/20/23 0829 07/21/23 0505 07/23/23 0448  WBC 7.6 6.4 6.3 7.3 6.5  NEUTROABS 6.2  --   --  6.6  --   HGB 11.3* 9.1* 9.6* 8.3* 7.6*  HCT 33.3* 28.2* 29.1* 25.3* 23.7*  MCV 91.7 92.8 92.7 93.4 94.8  PLT 314 236 250 186 185   Basic Metabolic Panel: Recent Labs  Lab 07/18/23 1702 07/19/23 0559 07/20/23 0829 07/21/23 0505 07/23/23 0448  NA 127* 122* 126* 130* 134*  K 4.4 3.8 3.4* 3.5 3.4*  CL 79* 81* 85* 92* 100  CO2 32 23 20* 21*  28  GLUCOSE 86 317* 368* 301* 150*  BUN 77* 82* 88* 88* 73*  CREATININE 4.56* 4.51* 4.69* 3.95* 2.82*  CALCIUM 9.6 8.2* 7.9* 7.7* 8.6*  MG 2.5*  --  2.3  --   --   PHOS  --   --  6.3*  --  2.7   GFR: Estimated Creatinine Clearance: 10.1 mL/min (A) (by C-G formula based on SCr of 2.82 mg/dL (H)). Liver Function Tests: Recent Labs  Lab 07/18/23 1702 07/20/23 0829 07/23/23 0448  AST 27  --   --   ALT 16  --   --   ALKPHOS 57  --   --   BILITOT 1.0  --   --   PROT 7.1  --   --   ALBUMIN 3.9 2.8* 2.4*   Recent  Results (from the past 240 hour(s))  SARS Coronavirus 2 by RT PCR (hospital order, performed in [facility] hospital lab) *cepheid single result test* Anterior Nasal Swab     Status: None   Collection Time: 07/18/23  6:32 PM   Specimen: Anterior Nasal Swab  Result Value Ref Range Status   SARS Coronavirus 2 by RT PCR NEGATIVE NEGATIVE Final    Comment: (NOTE) SARS-CoV-2 target nucleic acids are NOT DETECTED.  The SARS-CoV-2 RNA is generally detectable in upper and lower respiratory specimens during the acute phase of infection. The lowest concentration of SARS-CoV-2 viral copies this assay can detect is 250 copies / mL. A negative result does not preclude SARS-CoV-2 infection and should not be used as the sole basis for treatment or other patient management decisions.  A negative result may occur with improper specimen collection / handling, submission of specimen other than nasopharyngeal swab, presence of viral mutation(s) within the areas targeted by this assay, and inadequate number of viral copies (<250 copies / mL). A negative result must be combined with clinical observations, patient history, and epidemiological information.  Fact Sheet for Patients:   RoadLapTop.co.za  Fact Sheet for Healthcare Providers: http://kim-miller.com/  This test is not yet approved or  cleared by the [place] FDA and has been  authorized for detection and/or diagnosis of SARS-CoV-2 by FDA under an Emergency Use Authorization (EUA).  This EUA will remain in effect (meaning this test can be used) for the duration of the COVID-19 declaration under Section 564(b)(1) of the Act, 21 U.S.C. section 360bbb-3(b)(1), unless the authorization is terminated or revoked sooner.  Performed at [facility], [address], [place], [place] [address]   MRSA Next Gen by PCR, Nasal     Status: None   Collection Time: 07/19/23  4:51 PM   Specimen: Nasal Mucosa; Nasal Swab  Result Value Ref Range Status   MRSA by PCR Next Gen NOT DETECTED NOT DETECTED Final    Comment: (NOTE) The GeneXpert MRSA Assay (FDA approved for NASAL specimens only), is one component of a comprehensive MRSA colonization surveillance program. It is not intended to diagnose MRSA infection nor to guide or monitor treatment for MRSA infections. Test performance is not FDA approved in patients less than 37 years old. Performed at [facility], [address] [place], [place] [address]    Scheduled Meds:  heparin injection (subcutaneous)  5,000 Units Subcutaneous Q8H   hydrALAZINE  100 mg Oral TID   insulin aspart  0-5 Units Subcutaneous QHS   insulin aspart  0-9 Units Subcutaneous TID WC   insulin glargine-yfgn  5 Units Subcutaneous QHS   NIFEdipine  60 mg Oral Daily   Continuous Infusions:  famotidine (PEPCID) IV 20 mg (07/23/23 1957)    LOS: 6 days   Shon Hale M.D on 07/24/2023 at 6:14 PM  Go to [email or web address] - for contact info  [facility] - Office  [phone]  If 7PM-7AM, please contact night-coverage [email or web address] 07/24/2023, 6:14 PM

## 2023-07-24 NOTE — Discharge Instructions (Addendum)
Discharge Open Abdominal Surgery Instructions:  Wound Packing: Daily packing until unable to pack into the skin  Iodoform packing X 4 between staples (wicks), cut iodoform about 4-5cm in length - cut 4 of this size and pack into the incision between the staples, Only pack in slightly into the skin wound 1-2 cm in depth, leaving a long tail that can be pulled out easily, cover with abd and paper tape  Common Complaints: Pain at the incision site is common. This will improve with time. Take your pain medications as described below. Some nausea is common and poor appetite. The main goal is to stay hydrated the first few days after surgery.   Diet/ Activity: Diet as tolerated. You have started and tolerated a diet in the hospital, and should continue to increase what you are able to eat.   You may not have a large appetite, but it is important to stay hydrated. Drink 64 ounces of water a day. Your appetite will return with time.  Keep a dry dressing in place over your staples daily or as needed. Some minor pink/ blood tinged drainage is expected. This will stop in a few days after surgery.  Shower per your regular routine daily.  Do not take hot showers. Take warm showers that are less than 10 minutes. Path the incision dry. Wear an abdominal binder daily with activity. You do not have to wear this while sleeping or sitting.  Rest and listen to your body, but do not remain in bed all day.  Walk everyday for at least 15-20 minutes. Deep cough and move around every 1-2 hours in the first few days after surgery.  Do not lift > 10 lbs, perform excessive bending, pushing, pulling, squatting for 6-8 weeks after surgery.  The activity restrictions and the abdominal binder are to prevent hernia formation at your incision while you are healing.  Do not place lotions or balms on your incision unless instructed to specifically by Dr. Henreitta Leber.   Pain Expectations and Narcotics: -After surgery you will have  pain associated with your incisions and this is normal. The pain is muscular and nerve pain, and will get better with time. -You are encouraged and expected to take non narcotic medications like tylenol and ibuprofen (when able) to treat pain as multiple modalities can aid with pain treatment. -Narcotics are only used when pain is severe or there is breakthrough pain. -You are not expected to have a pain score of 0 after surgery, as we cannot prevent pain. A pain score of 3-4 that allows you to be functional, move, walk, and tolerate some activity is the goal. The pain will continue to improve over the days after surgery and is dependent on your surgery. -Due to [place] law, we are only able to give a certain amount of pain medication to treat post operative pain, and we only give additional narcotics on a patient by patient basis.  -For most laparoscopic surgery, studies have shown that the majority of patients only need 10-15 narcotic pills, and for open surgeries most patients only need 15-20.   -Having appropriate expectations of pain and knowledge of pain management with non narcotics is important as we do not want anyone to become addicted to narcotic pain medication.  -Using ice packs in the first 48 hours and heating pads after 48 hours, wearing an abdominal binder (when recommended), and using over the counter medications are all ways to help with pain management.   -Simple acts like meditation  and mindfulness practices after surgery can also help with pain control and research has proven the benefit of these practices.  Medication: Take tylenol and ibuprofen as needed for pain control, alternating every 4-6 hours.  Example:  Tylenol 1000mg  @ 6am, 12noon, 6pm, (Do not exceed 4000mg  of tylenol a day). Ibuprofen 800mg  @ 9am, 3pm, 9pm, 3am (Do not exceed 3600mg  of ibuprofen a day).  Take Roxicodone for breakthrough pain every 4 hours.  Take Colace for constipation related to narcotic pain  medication. If you do not have a bowel movement in 2 days, take Miralax over the counter.  Drink plenty of water to also prevent constipation.   Contact Information: If you have questions or concerns, please call our office, [phone], Monday- Thursday 8AM-5PM and Friday 8AM-12Noon.  If it is after hours or on the weekend, please call [facility] Main Number, [phone], [phone], and ask to speak to the surgeon on call for Dr. Henreitta Leber at [facility].

## 2023-07-24 NOTE — Anesthesia Postprocedure Evaluation (Signed)
Anesthesia Post Note  Patient: JAMYRIA OZANICH  Procedure(s) Performed: HERNIA REPAIR FEMORAL INCARCERATED (Left: Groin) SMALL BOWEL RESECTION (Left: Groin)  Patient location during evaluation: Phase II Anesthesia Type: General Level of consciousness: awake Pain management: pain level controlled Vital Signs Assessment: post-procedure vital signs reviewed and stable Respiratory status: spontaneous breathing and respiratory function stable Cardiovascular status: blood pressure returned to baseline and stable Postop Assessment: no headache and no apparent nausea or vomiting Anesthetic complications: no Comments: Late entry   No notable events documented.   Last Vitals:  Vitals:   07/24/23 0234 07/24/23 0548  BP: (!) 121/53 (!) 141/66  Pulse: 100 (!) 103  Resp: 18 16  Temp: 37.1 C 37.3 C  SpO2: 97% 100%    Last Pain:  Vitals:   07/24/23 0548  TempSrc: Oral  PainSc:                  Windell Norfolk

## 2023-07-24 NOTE — Care Management Important Message (Signed)
Important Message  Patient Details  Name: Emily Jennings MRN: [id number] Date of Birth: [birth year]   Important Message Given:  Yes - Medicare IM     Corey Harold 07/24/2023, 12:39 PM

## 2023-07-24 NOTE — Plan of Care (Signed)
  Problem: Clinical Measurements: Goal: Ability to maintain clinical measurements within normal limits will improve Outcome: Progressing Goal: Diagnostic test results will improve Outcome: Progressing Goal: Cardiovascular complication will be avoided Outcome: Progressing   Problem: Nutrition: Goal: Adequate nutrition will be maintained Outcome: Progressing   Problem: Coping: Goal: Level of anxiety will decrease Outcome: Progressing

## 2023-07-25 DIAGNOSIS — I10 Essential (primary) hypertension: Secondary | ICD-10-CM | POA: Diagnosis not present

## 2023-07-25 DIAGNOSIS — Z794 Long term (current) use of insulin: Secondary | ICD-10-CM

## 2023-07-25 DIAGNOSIS — K4030 Unilateral inguinal hernia, with obstruction, without gangrene, not specified as recurrent: Secondary | ICD-10-CM | POA: Diagnosis not present

## 2023-07-25 DIAGNOSIS — K559 Vascular disorder of intestine, unspecified: Secondary | ICD-10-CM | POA: Diagnosis not present

## 2023-07-25 DIAGNOSIS — I5032 Chronic diastolic (congestive) heart failure: Secondary | ICD-10-CM

## 2023-07-25 DIAGNOSIS — E11649 Type 2 diabetes mellitus with hypoglycemia without coma: Secondary | ICD-10-CM

## 2023-07-25 LAB — CBC
HCT: 24.6 % — ABNORMAL LOW (ref 36.0–46.0)
Hemoglobin: 7.7 g/dL — ABNORMAL LOW (ref 12.0–15.0)
MCH: 30.7 pg (ref 26.0–34.0)
MCHC: 31.3 g/dL (ref 30.0–36.0)
MCV: 98 fL (ref 80.0–100.0)
Platelets: 197 10*3/uL (ref 150–400)
RBC: 2.51 MIL/uL — ABNORMAL LOW (ref 3.87–5.11)
RDW: 13.4 % (ref 11.5–15.5)
WBC: 5.6 10*3/uL (ref 4.0–10.5)
nRBC: 0 % (ref 0.0–0.2)

## 2023-07-25 LAB — BASIC METABOLIC PANEL
Anion gap: 7 (ref 5–15)
BUN: 57 mg/dL — ABNORMAL HIGH (ref 8–23)
CO2: 27 mmol/L (ref 22–32)
Calcium: 9 mg/dL (ref 8.9–10.3)
Chloride: 103 mmol/L (ref 98–111)
Creatinine, Ser: 2.58 mg/dL — ABNORMAL HIGH (ref 0.44–1.00)
GFR, Estimated: 17 mL/min — ABNORMAL LOW (ref >60)
Glucose, Bld: 261 mg/dL — ABNORMAL HIGH (ref 70–99)
Potassium: 4.5 mmol/L (ref 3.5–5.1)
Sodium: 137 mmol/L (ref 135–145)

## 2023-07-25 LAB — GLUCOSE, CAPILLARY
Glucose-Capillary: 211 mg/dL — ABNORMAL HIGH (ref 70–99)
Glucose-Capillary: 207 mg/dL — ABNORMAL HIGH (ref 70–99)
Glucose-Capillary: 465 mg/dL — ABNORMAL HIGH (ref 70–99)

## 2023-07-25 MED ORDER — FUROSEMIDE 40 MG PO TABS: 40.0000 mg | ORAL_TABLET | Freq: Every day | ORAL | Status: DC

## 2023-07-25 MED ORDER — OXYCODONE HCL 5 MG PO TABS: 5.0000 mg | ORAL_TABLET | ORAL | 0 refills | Status: AC | PRN

## 2023-07-25 NOTE — Discharge Summary (Signed)
Physician Discharge Summary   Patient: Emily Jennings MRN: [id number] DOB: 01-02-1934  Admit date:     07/18/2023  Discharge date: 07/25/23  Discharge Physician: Vassie Loll   PCP: [name], MD   Recommendations at discharge:  Reassess blood pressure and adjust antihypertensive regimen as needed Repeat basic metabolic panel to follow lites renal function Repeat CBC to follow hemoglobin trend. Continue close monitoring of patient's CBGs with further adjustment to hypoglycemic regimen as required. Follow recommendations for follow-up ultrasound to further determine needed management for thyroid nodule. (TSH WNL one month ago)  Discharge Diagnoses: Principal Problem:   Incarcerated left inguinal hernia Active Problems:   Acute kidney injury superimposed on chronic kidney disease (HCC)   Hypoglycemia   Hyponatremia   Diabetes (HCC)   CKD stage 4 due to type 2 diabetes mellitus (HCC)   CHF (congestive heart failure) (HCC)   Essential hypertension   Small bowel obstruction (HCC)   Ischemic bowel disease [facility])  Brief Hospital admission course: As per H&P written by Dr. Mariea Clonts on 07/18/2023  Emily Jennings is a 89 y.o. female with medical history significant for diabetes mellitus, hypertension, CKD 4, CHF, coronary artery disease. Patient was brought to the ED reports of abdominal pain and distention, and really any oral intake over the past 2 weeks.  On EMS arrival blood sugar was 50.  400 cc D10 given. On my evaluation patient is awake and alert and able to answer questions, she confirms above history.  She denies urinary symptoms.  He also has a swelling to her left hip that has been there for about a month.   Recent hospitalization 10/15 to 10/18 for pneumonia deemed aspiration pneumonia, with acute hypoxic respiratory failure and CHF.  Treated with IV antibiotics and discharged on Keflex and doxycycline.  Diuresed with IV Lasix and discharged on Aldactone and Lasix.   Weaned off O2.   ED Course: Systolic 130s to 213Y.  Stable vitals.  Creatinine elevated at 4.56. CT abdomen and pelvis-high-grade small bowel obstruction transition points left inguinal hernia containing a small portion of mid to distal jejunum. EDP was able to partially reduce hernia. Gen surgeon Dr. Juliann Pulse repair pending if incarceration reduced further or not.  Assessment and Plan: 1)Incarcerated left inguinal hernia---s/p surgical repair -Clinically and radiologically patient had incarcerated left inguinal hernia with high-grade small bowel obstruction -General Surgery consult from Dr. Henreitta Leber appreciated  -patient is status post  primary repair of femoral inguinal hernia, small bowel resection and primary side-to-side anastomosis on 07/19/2023 -NG tube out 07/20/23 -Continue as needed morphine sulfate IV for pain control and as needed Zofran for nausea -Diet advanced as recommended by general surgery -Having bowel movements and tolerating diet; discharged home with wound care instructions and outpatient follow-up with general surgery.   2)Acute kidney injury superimposed on chronic kidney disease IV -Baseline creatinine usually between 2.6-3  -creatinine elevated at 4.56 on admission,  -Creatinine 2.5 at discharge -Safe to resume prior to admission medication -Continue patient follow-up with nephrology service.   3) acute on chronic hyponatremia -Stabilized and essentially back to baseline after hydration -Okay to resume diuretics -Continue to follow electrolytes trend.   4) chronic diastolic dysfunction CHF (congestive heart failure) (HCC) -Stable and compensated.  Last echo 04/2023 EF of 65 to 70% grade 2 DD. -GDMT limited by CKD 4,  -Safe to resume home Lasix and Aldactone -Continue patient follow-up with cardiology service.   5)Uncontrolled type 2 diabetes mellitus --A1c 7.5 reflecting uncontrolled  DM with hyperglycemia PTA  -Resume home hypoglycemic  regimen -Modified carbohydrate diet discussed with patient -Continue to follow CBG fluctuation and further adjust medication as needed.   6)HTN -Resume adjusted home antihypertensive regimen -Heart healthy/low-sodium diet discussed with patient.   7)1.7 cm Left Thyroid Nodule-- --Recommend thyroid US as outpt. -TSH within normal limits one month ago.   8)HLD--- - resume statin -Continue heart healthy diet.   9)GERD (gastroesophageal reflux disease) - Continue PPI.   10)Acute on Chronic Anemia--in the setting of CKD 4  Anticipate drop in H&H in the postoperative period. -Continue to follow hemoglobin trend and transfuse as needed.  No overt bleeding appreciated at discharge.   11) hypokalemia--- --repleted and within normal limits at discharge -Continue to follow electrolytes trend.    12) medial rectum and stage II injury/left inguinal area with postoperative wound.   -Continue wound care as instructed -Keep area clean and dry.   Consultants: General Surgery Procedures performed: See below for x-ray report; status post surgical repair will of SBO and inguinal hernia. Disposition: Home Diet recommendation: Heart healthy/modified carbohydrate diet.  DISCHARGE MEDICATION: Allergies as of 07/25/2023       Reactions   Amlodipine Swelling   Benadryl [diphenhydramine Hcl] Hypertension   Citalopram Other (See Comments)   unknown   Farxiga [dapagliflozin] Other (See Comments)   DKA   Metformin And Related Diarrhea   Lost appetite and weight    Tramadol Other (See Comments)   Felt light headed and dizzy   Crestor [rosuvastatin] Other (See Comments)   "Feet swelling", makes her feel weak. Pt takes 5mg  at home        Medication List     STOP taking these medications    potassium chloride 10 MEQ tablet Commonly known as: KLOR-CON       TAKE these medications    acetaminophen 325 MG tablet Commonly known as: TYLENOL Take 2 tablets (650 mg total) by mouth every  6 (six) hours as needed for mild pain (pain score 1-3) (or Fever >/= 101).   albuterol 108 (90 Base) MCG/ACT inhaler Commonly known as: VENTOLIN HFA Inhale 2 puffs into the lungs every 2 (two) hours as needed for wheezing or shortness of breath.   calcitRIOL 0.25 MCG capsule Commonly known as: ROCALTROL Take 0.25 mcg by mouth 3 (three) times a week.   Farxiga 10 MG Tabs tablet Generic drug: dapagliflozin propanediol Take 10 mg by mouth every morning.   furosemide 40 MG tablet Commonly known as: Lasix Take 1 tablet (40 mg total) by mouth daily. What changed: when to take this   hydrALAZINE 100 MG tablet Commonly known as: APRESOLINE Take 1 tablet (100 mg total) by mouth 3 (three) times daily.   insulin lispro 100 UNIT/ML KwikPen Commonly known as: HumaLOG KwikPen Inject 5 Units into the skin 3 (three) times daily. Max daily 30 units   NIFEdipine 60 MG 24 hr tablet Commonly known as: ADALAT CC Take 1 tablet (60 mg total) by mouth daily.   oxyCODONE 5 MG immediate release tablet Commonly known as: Oxy IR/ROXICODONE Take 1 tablet (5 mg total) by mouth every 4 (four) hours as needed for up to 7 days for severe pain (pain score 7-10).   pantoprazole 40 MG tablet Commonly known as: Protonix Take 1 tablet (40 mg total) by mouth daily.   rosuvastatin 5 MG tablet Commonly known as: CRESTOR TAKE 1 TABLET (5 MG TOTAL) BY MOUTH DAILY.   spironolactone 50 MG tablet Commonly known as:  ALDACTONE Take 1 tablet (50 mg total) by mouth daily.   Evaristo Bury FlexTouch 100 UNIT/ML FlexTouch Pen Generic drug: insulin degludec Inject 5 Units into the skin daily.        Follow-up Information     Lucretia Roers, MD Follow up on 08/01/2023.   Specialty: General Surgery Contact information: [address] [place] [facility] [address] [phone]         [name], MD. Schedule an appointment as soon as possible for a visit in 10 day(s).   Specialty: Family  Medicine Contact information: [address], [address] [place] [address] [phone]                Discharge Exam: Ceasar Mons Weights   07/18/23 1602 07/19/23 1236 07/19/23 1700  Weight: 46.3 kg 46.3 kg 48.5 kg   Gen:- Awake Alert, no acute distress; tolerating diet and having bowel movements. HEENT:- [place].AT, No sclera icterus Neck-Supple Neck,No JVD,.  Lungs-  CTAB , fair symmetrical air movement CV- S1, S2 normal, regular  Abd-  +ve B.Sounds, Abd Soft, much improved postoperative tenderness, left inguinal area postoperative wound with clean dressing in place. Extremity/Skin:- No  edema, pedal pulses present.  Stage II medial rectum pressure injury present at time of admission without signs of superimposed infection. Psych-affect is appropriate, oriented x3 Neuro-no new focal deficits, no tremors  Condition at discharge: Stable and improved.  The results of significant diagnostics from this hospitalization (including imaging, microbiology, ancillary and laboratory) are listed below for reference.   Imaging Studies: CT ABDOMEN PELVIS WO CONTRAST  Result Date: 07/19/2023 CLINICAL DATA:  Follow-up small-bowel obstruction. Left inguinal hernia. EXAM: CT ABDOMEN AND PELVIS WITHOUT CONTRAST TECHNIQUE: Multidetector CT imaging of the abdomen and pelvis was performed following the standard protocol without IV contrast. RADIATION DOSE REDUCTION: This exam was performed according to the departmental dose-optimization program which includes automated exposure control, adjustment of the mA and/or kV according to patient size and/or use of iterative reconstruction technique. COMPARISON:  07/18/2023 FINDINGS: Lower chest: No acute findings. Hepatobiliary: Stable small cysts in the right and left lobes. No suspicious mass visualized on this unenhanced exam. Prior cholecystectomy. No evidence of biliary obstruction. Pancreas: No mass or inflammatory process visualized on this unenhanced exam.  Spleen:  Within normal limits in size. Adrenals/Urinary tract: Bilateral renal calculi are again seen, with largest measuring 11 mm in the left kidney. No evidence of urolithiasis or hydronephrosis. Unremarkable unopacified urinary bladder. Stomach/Bowel: Small left groin hernia is again seen containing a small bowel loop and some adjacent fluid. This is a transition point with moderate to severe small bowel dilatation and air-fluid levels proximally. This is consistent with a small-bowel obstruction due to left groin hernia, which may represent a femoral hernia rather than inguinal hernia. This shows no significant change compared to prior study. No evidence of pneumatosis or portal venous gas, or free intraperitoneal air. Mild diffuse mesenteric edema and small amount of ascites seen, without evidence of abscess. Diverticulosis is seen mainly involving the descending and sigmoid colon, however there is no evidence of diverticulitis. Vascular/Lymphatic: No pathologically enlarged lymph nodes identified. No evidence of abdominal aortic aneurysm. Reproductive: Several small calcified uterine fibroids again seen. Adnexal regions are unremarkable. Other:  Diffuse mesenteric and body wall edema. Musculoskeletal: No suspicious bone lesions identified. Fluid collection again seen in the subcutaneous tissues along the lateral aspect of the left hip, which measures 5.5 x 4.7 cm. IMPRESSION: No significant change in small-bowel obstruction due to left  groin hernia which contains a distal small bowel loop. This may represent a femoral hernia rather than an inguinal hernia. Mild diffuse mesenteric edema and small amount of ascites. Stable subcutaneous fluid collection along the lateral aspect of the left hip. Colonic diverticulosis, without radiographic evidence of diverticulitis. Bilateral nephrolithiasis. No evidence of urolithiasis or hydronephrosis. Small calcified uterine fibroids. Electronically Signed   By: Danae Orleans  M.D.   On: 07/19/2023 13:52   DG Chest Portable 1 View  Result Date: 07/19/2023 CLINICAL DATA:  verification of NG tube placement EXAM: PORTABLE CHEST 1 VIEW COMPARISON:  07/18/2023. FINDINGS: Interval placement of enteric tube with its tip and side hole overlying left upper quadrant, within the proximal stomach. Consider further advancement by 2 inches to confidently put the side-hole within the fundus. Bilateral lung fields are clear. Again noted is elevated right hemidiaphragm. Bilateral costophrenic angles are clear. Normal cardio-mediastinal silhouette. No acute osseous abnormalities. The soft tissues are within normal limits. IMPRESSION: *No acute cardiopulmonary abnormality. *Interval placement of enteric tube with its tip and side hole overlying left upper quadrant, within the proximal stomach. Consider further advancement by 2 inches to confidently put the side-hole within the fundus. Electronically Signed   By: Jules Schick M.D.   On: 07/19/2023 09:28   DG Abdomen 1 View  Addendum Date: 07/19/2023   ADDENDUM REPORT: 07/19/2023 01:57 ADDENDUM: These results were called by telephone at the time of interpretation on 07/18/2023 at 10:20 p.m. to provider Dr. Charm Barges, who verbally acknowledged these results. Electronically Signed   By: Darliss Cheney M.D.   On: 07/19/2023 01:57   Result Date: 07/19/2023 CLINICAL DATA:  NG tube EXAM: ABDOMEN - 1 VIEW COMPARISON:  CT abdomen and pelvis 07/18/2023 FINDINGS: The distal 13 cm of the enteric tube is folded upon itself. The folded portion enters the stomach, but distal tip of the tube is projected cranially in the mid esophagus. Dilated small bowel loops are again seen compatible with small bowel obstruction. IMPRESSION: The distal 13 cm of the enteric tube is folded upon itself. The folded portion enters the stomach, but distal tip of the tube is projected cranially in the mid esophagus. Recommend repositioning. Electronically Signed: By: Darliss Cheney  M.D. On: 07/18/2023 22:15   DG Chest 1 View  Result Date: 07/18/2023 CLINICAL DATA:  Cough EXAM: CHEST  1 VIEW COMPARISON:  07/03/2023, 07/04/2023 FINDINGS: Single frontal view of the chest demonstrates an unremarkable cardiac silhouette. Interval resolution of the left upper lobe airspace disease and effusions seen previously. No pneumothorax. No acute bony abnormalities. IMPRESSION: 1. No acute intrathoracic process. Resolution of the left lower lobe pneumonia and effusion seen previously. 2. Please refer to prior CT chest report 07/04/2023 describing recommended follow-up for an irregular left perihilar nodule, not readily apparent on this x-ray. Electronically Signed   By: Sharlet Salina M.D.   On: 07/18/2023 19:23   CT ABDOMEN PELVIS WO CONTRAST  Result Date: 07/18/2023 CLINICAL DATA:  Abdominal pain, nausea and vomiting EXAM: CT ABDOMEN AND PELVIS WITHOUT CONTRAST TECHNIQUE: Multidetector CT imaging of the abdomen and pelvis was performed following the standard protocol without IV contrast. Unenhanced CT was performed per clinician order. Lack of IV contrast limits sensitivity and specificity, especially for evaluation of abdominal/pelvic solid viscera. RADIATION DOSE REDUCTION: This exam was performed according to the departmental dose-optimization program which includes automated exposure control, adjustment of the mA and/or kV according to patient size and/or use of iterative reconstruction technique. COMPARISON:  01/02/2022 FINDINGS: Lower chest:  No acute pleural or parenchymal lung disease. Cardiomegaly without pericardial effusion. Hepatobiliary: Stable hepatic cysts. Otherwise unremarkable unenhanced appearance of the liver. Gallbladder is decompressed, with no evidence of cholelithiasis or cholecystitis. Pancreas: Unremarkable unenhanced appearance. Spleen: Unremarkable unenhanced appearance. Adrenals/Urinary Tract: Bilateral nonobstructing renal calculi are again identified, largest in the  left kidney measuring 14 mm. Bilateral simple appearing renal cysts are again noted, which do not require specific imaging follow-up. The adrenals are unremarkable. The bladder is decompressed, limiting its evaluation. Stomach/Bowel: Diffusely dilated small bowel loops measuring up to 3.9 cm, with multiple gas fluid levels, consistent with small-bowel obstruction. Transition point may lie within the left lower quadrant at the site of a chronic left inguinal hernia, which appears to contain a small portion of mid to distal jejunum. Multiple decompressed loops of the jejunum and ileum are seen within the lower pelvis. Diverticulosis of the sigmoid colon without evidence of diverticulitis. No bowel wall thickening or inflammatory change. Vascular/Lymphatic: Aortic atherosclerosis. No enlarged abdominal or pelvic lymph nodes. Reproductive: Uterus is atrophic, with calcified uterine fibroids noted. No adnexal masses. Other: Trace lower pelvic ascites.  No free intraperitoneal gas. Left inguinal hernia is identified, with a small knuckle of bowel extending into the hernia sac consistent with obstruction. No evidence of bowel wall thickening to suggest ischemia. Small amount of ascites within the hernia sac. Musculoskeletal: No acute displaced fractures. Partial visualization of a subcutaneous fluid collection lateral to the left hip, measuring at least 5.7 x 5.2 cm. Please correlate with any history of prior trauma as this could reflect sequela of subcutaneous hematoma. Reconstructed images demonstrate no additional findings. IMPRESSION: 1. High-grade small bowel obstruction, with transition point at the site of a left inguinal hernia containing a small portion of mid to distal jejunum. Surgical consultation recommended. 2. Trace ascites. 3. Sigmoid diverticulosis without diverticulitis. 4. Indeterminate subcutaneous fluid collection overlying the left hip, please correlate with any history of trauma as this could reflect  sequela of prior hematoma. 5. Bilateral nonobstructing renal calculi.  No obstructive uropathy. 6.  Aortic Atherosclerosis (ICD10-I70.0). Electronically Signed   By: Sharlet Salina M.D.   On: 07/18/2023 18:56   CT CHEST WO CONTRAST  Result Date: 07/04/2023 CLINICAL DATA:  Chronic dyspnea. EXAM: CT CHEST WITHOUT CONTRAST TECHNIQUE: Multidetector CT imaging of the chest was performed following the standard protocol without IV contrast. RADIATION DOSE REDUCTION: This exam was performed according to the departmental dose-optimization program which includes automated exposure control, adjustment of the mA and/or kV according to patient size and/or use of iterative reconstruction technique. COMPARISON:  July 03, 2023. FINDINGS: Cardiovascular: Atherosclerosis of thoracic aorta is noted without aneurysm formation. Mild cardiomegaly. No pericardial effusion. Coronary artery calcifications are noted. Mediastinum/Nodes: 1.7 cm left thyroid nodule is noted. Esophagus is unremarkable. Evaluation of mediastinum is limited due to lack of intravenous contrast, and therefore adenopathy cannot be excluded. Lungs/Pleura: No pneumothorax is noted. Minimal left pleural effusion is noted. Right lung is clear. Mild left lingular atelectasis or pneumonia is noted. 13 x 11 mm irregular density is seen in left perihilar region. Upper Abdomen: No acute abnormality. Musculoskeletal: No chest wall mass or suspicious bone lesions identified. IMPRESSION: Minimal left pleural effusion is noted. Left lingular opacity is noted concerning for atelectasis or pneumonia. 13 x 11 mm irregular density is noted in left perihilar region which may represent focal inflammation or pneumonia, but neoplasm cannot be excluded. Follow-up chest CT in 2-3 weeks is recommended to ensure stability or resolution of this abnormality. 1.7  cm left thyroid nodule. Recommend thyroid US. (Ref: J Am Coll Radiol. 2015 Feb;12(2): 143-50). Coronary artery calcifications  are noted suggesting coronary artery disease. Aortic Atherosclerosis (ICD10-I70.0). Electronically Signed   By: Lupita Raider M.D.   On: 07/04/2023 15:00   DG Chest 2 View  Result Date: 07/03/2023 CLINICAL DATA:  Shortness of breath.  History of left breast cancer. EXAM: CHEST - 2 VIEW COMPARISON:  06/18/2023 FINDINGS: Heart size is normal. Aortic atherosclerosis as seen previously. Small pleural effusions in both posterior costophrenic angles. Worsening infiltrate in the left lower lobe consistent with bronchopneumonia. No sign of heart failure. No focal bone lesion. IMPRESSION: Worsening left lower lobe infiltrate consistent with bronchopneumonia. Small pleural effusions in both posterior costophrenic angles. Electronically Signed   By: Paulina Fusi M.D.   On: 07/03/2023 18:11    Microbiology: Results for orders placed or performed during the hospital encounter of 07/18/23  SARS Coronavirus 2 by RT PCR (hospital order, performed in [facility] hospital lab) *cepheid single result test* Anterior Nasal Swab     Status: None   Collection Time: 07/18/23  6:32 PM   Specimen: Anterior Nasal Swab  Result Value Ref Range Status   SARS Coronavirus 2 by RT PCR NEGATIVE NEGATIVE Final    Comment: (NOTE) SARS-CoV-2 target nucleic acids are NOT DETECTED.  The SARS-CoV-2 RNA is generally detectable in upper and lower respiratory specimens during the acute phase of infection. The lowest concentration of SARS-CoV-2 viral copies this assay can detect is 250 copies / mL. A negative result does not preclude SARS-CoV-2 infection and should not be used as the sole basis for treatment or other patient management decisions.  A negative result may occur with improper specimen collection / handling, submission of specimen other than nasopharyngeal swab, presence of viral mutation(s) within the areas targeted by this assay, and inadequate number of viral copies (<250 copies / mL). A negative result must be  combined with clinical observations, patient history, and epidemiological information.  Fact Sheet for Patients:   RoadLapTop.co.za  Fact Sheet for Healthcare Providers: http://kim-miller.com/  This test is not yet approved or  cleared by the [place] FDA and has been authorized for detection and/or diagnosis of SARS-CoV-2 by FDA under an Emergency Use Authorization (EUA).  This EUA will remain in effect (meaning this test can be used) for the duration of the COVID-19 declaration under Section 564(b)(1) of the Act, 21 U.S.C. section 360bbb-3(b)(1), unless the authorization is terminated or revoked sooner.  Performed at [facility], [address] [place], [place] [address]   MRSA Next Gen by PCR, Nasal     Status: None   Collection Time: 07/19/23  4:51 PM   Specimen: Nasal Mucosa; Nasal Swab  Result Value Ref Range Status   MRSA by PCR Next Gen NOT DETECTED NOT DETECTED Final    Comment: (NOTE) The GeneXpert MRSA Assay (FDA approved for NASAL specimens only), is one component of a comprehensive MRSA colonization surveillance program. It is not intended to diagnose MRSA infection nor to guide or monitor treatment for MRSA infections. Test performance is not FDA approved in patients less than 64 years old. Performed at [facility], [address], [place], [place] [address]    *Note: Due to a large number of results and/or encounters for the requested time period, some results have not been displayed. A complete set of results can be found in Results Review.    Labs: CBC: Recent Labs  Lab 07/19/23 0559 07/20/23  1610 07/21/23 0505 07/23/23 0448 07/25/23 0421  WBC 6.4 6.3 7.3 6.5 5.6  NEUTROABS  --   --  6.6  --   --   HGB 9.1* 9.6* 8.3* 7.6* 7.7*  HCT 28.2* 29.1* 25.3* 23.7* 24.6*  MCV 92.8 92.7 93.4 94.8 98.0  PLT 236 250 186 185 197   Basic Metabolic Panel: Recent Labs  Lab 07/19/23 0559 07/20/23 0829  07/21/23 0505 07/23/23 0448 07/25/23 0421  NA 122* 126* 130* 134* 137  K 3.8 3.4* 3.5 3.4* 4.5  CL 81* 85* 92* 100 103  CO2 23 20* 21* 28 27  GLUCOSE 317* 368* 301* 150* 261*  BUN 82* 88* 88* 73* 57*  CREATININE 4.51* 4.69* 3.95* 2.82* 2.58*  CALCIUM 8.2* 7.9* 7.7* 8.6* 9.0  MG  --  2.3  --   --   --   PHOS  --  6.3*  --  2.7  --    Liver Function Tests: Recent Labs  Lab 07/20/23 0829 07/23/23 0448  ALBUMIN 2.8* 2.4*   CBG: Recent Labs  Lab 07/24/23 2341 07/24/23 2343 07/25/23 0746 07/25/23 1128 07/25/23 1629  GLUCAP 391* 355* 211* 207* 465*    Discharge time spent: greater than 30 minutes.  Signed: Vassie Loll, MD [facility] 07/25/2023

## 2023-07-25 NOTE — Progress Notes (Signed)
Mobility Specialist Progress Note:    07/25/23 1145  Mobility  Activity Ambulated with assistance in hallway;Ambulated with assistance to bathroom  Level of Assistance Standby assist, set-up cues, supervision of patient - no hands on  Assistive Device Front wheel walker  Distance Ambulated (ft) 140 ft  Range of Motion/Exercises Active;All extremities  Activity Response Tolerated well  Mobility Referral Yes  $Mobility charge 1 Mobility  Mobility Specialist Start Time (ACUTE ONLY) 1145  Mobility Specialist Stop Time (ACUTE ONLY) 1210  Mobility Specialist Time Calculation (min) (ACUTE ONLY) 25 min   Pt received in bed, agreeable to mobility. Required SBA to stand and ambulate with RW. Tolerated well, asx throughout. Returned pt to room, left supine. Bed alarm on, all needs met.   Lawerance Bach Mobility Specialist Please contact via Special educational needs teacher or  Rehab office at [phone]

## 2023-07-25 NOTE — Inpatient Diabetes Management (Addendum)
Inpatient Diabetes Program Recommendations  AACE/ADA: New Consensus Statement on Inpatient Glycemic Control (2015)  Target Ranges:  Prepandial:   less than 140 mg/dL      Peak postprandial:   less than 180 mg/dL (1-2 hours)      Critically ill patients:  140 - 180 mg/dL    Latest Reference Range & Units 07/24/23 07:19 07/24/23 11:42 07/24/23 16:48 07/24/23 23:41 07/24/23 23:43  Glucose-Capillary 70 - 99 mg/dL 403 (H)  5 units Novolog  237 (H)  3 units Novolog  288 (H)  5 units Novolog  391 (H)  5 units Novolog  5 units Semglee 355 (H)  (H): Data is abnormally high  Latest Reference Range & Units 07/25/23 07:46  Glucose-Capillary 70 - 99 mg/dL 474 (H)  (H): Data is abnormally high    Home DM Meds: Tresiba 5 units every day      Humalog 5 units TID      Farxiga 5 mg daily  Current Orders: Semglee 5 units QHS      Novolog Sensitive Correction Scale/ SSI (0-9 units) TID AC + HS     MD- Note CBG 211 this AM.  Please consider:  1. Increase Semglee to 8 units at bedtime  2. Increase Novolog SSI to the 0-15 unit Moderate scale    --Will follow patient during hospitalization--  Ambrose Finland RN, MSN, CDCES Diabetes Coordinator Inpatient Glycemic Control Team Team Pager: [phone] (8a-5p)

## 2023-07-25 NOTE — Plan of Care (Signed)

## 2023-07-25 NOTE — Progress Notes (Signed)
Patient rested well throughout the night. Abdominal dressing changed twice PRN due to large amount of serous drainage. Pain 0/10 as stated by patient but facial grimacing and guarding noted, patient refused PRN  for pain.

## 2023-07-25 NOTE — Progress Notes (Signed)
   07/25/23 1318  Assess: MEWS Score  Temp 98.4 F (36.9 C)  BP (!) 174/72  MAP (mmHg) 103  Pulse Rate (!) 102  SpO2 98 %  O2 Device Room Air  Assess: MEWS Score  MEWS Temp 0  MEWS Systolic 0  MEWS Pulse 1  MEWS RR 1  MEWS LOC 0  MEWS Score 2  MEWS Score Color Yellow  Assess: if the MEWS score is Yellow or Red  Were vital signs accurate and taken at a resting state? Yes  Does the patient meet 2 or more of the SIRS criteria? No  MEWS guidelines implemented  No, previously yellow, continue vital signs every 4 hours  Provider Notification  Provider Name/Title MD [facility]  Date Provider Notified 07/25/23  Time Provider Notified 1329  Assess: SIRS CRITERIA  SIRS Temperature  0  SIRS Pulse 1  SIRS Respirations  0  SIRS WBC 0  SIRS Score Sum  1

## 2023-07-25 NOTE — Progress Notes (Signed)
6 Days Post-Op  Subjective: Patient seen and evaluated while resting in bed. Patient reports she is "pretty good." She reports she has not had much of an appetite; denies nausea or vomiting. Patient reports she is drinking and urinating ok and denies BM since yesterday. Patient denies abdominal pain and reports she is "better than yesterday."  Of note, nursing team reports her wound drained serosanguinous fluid. Nurse reports adding an abdominal pad that she changed twice overnight.   Objective: Vital signs in last 24 hours: Temp:  [98.3 F (36.8 C)-98.4 F (36.9 C)] 98.4 F (36.9 C) (11/06 1318) Pulse Rate:  [92-102] 102 (11/06 1318) Resp:  [12-21] 12 (11/06 0200) BP: (131-198)/(58-92) 174/72 (11/06 1318) SpO2:  [96 %-100 %] 98 % (11/06 1318) Last BM Date : 07/23/23  Intake/Output from previous day: 11/05 0701 - 11/06 0700 In: 560 [P.O.:560] Out: -  Intake/Output this shift: Total I/O In: 200.2 [IV Piggyback:200.2] Out: -   Physical Exam Pulmonary:     Effort: Pulmonary effort is normal.  Abdominal:     Palpations: Abdomen is soft.     Tenderness: There is no abdominal tenderness.  Skin:    Comments: Incision clean and intact; serosanguinous fluid draining from wound  Neurological:     Mental Status: She is alert.   Lab Results:  Recent Labs    07/23/23 0448 07/25/23 0421  WBC 6.5 5.6  HGB 7.6* 7.7*  HCT 23.7* 24.6*  PLT 185 197   BMET Recent Labs    07/23/23 0448 07/25/23 0421  NA 134* 137  K 3.4* 4.5  CL 100 103  CO2 28 27  GLUCOSE 150* 261*  BUN 73* 57*  CREATININE 2.82* 2.58*  CALCIUM 8.6* 9.0   PT/INR No results for input(s): "LABPROT", "INR" in the last 72 hours.  Studies/Results: No results found.  Anti-infectives: Anti-infectives (From admission, onward)    Start     Dose/Rate Route Frequency Ordered Stop   07/19/23 1300  ceFAZolin (ANCEF) IVPB 2g/100 mL premix        2 g 200 mL/hr over 30 Minutes Intravenous On call to O.R. 07/19/23  1158 07/19/23 1427     Assessment/Plan: Patient is a 89 y.o. female presenting with abdominal distention, nausea and vomiting with concern for small bowel obstruction secondary to incarcerated hernia. Patient is s/p primary repair femoral hernia (McVay approach) and small bowel resection primary side to side anastomosis on 07/19/23.   - Patient appears to be improving - Wound packed with iodoform wicks and abdominal pad placed at bedside - Patient stable for discharge from surgical perspective  - Appreciate hospitalist recommendations   LOS: 7 days   Bubba Hales 07/25/2023

## 2023-07-25 NOTE — Plan of Care (Signed)
  Problem: Education: Goal: Knowledge of General Education information will improve Description: Including pain rating scale, medication(s)/side effects and non-pharmacologic comfort measures Outcome: Progressing   Problem: Health Behavior/Discharge Planning: Goal: Ability to manage health-related needs will improve Outcome: Not Met (add Reason)   Problem: Clinical Measurements: Goal: Ability to maintain clinical measurements within normal limits will improve Outcome: Not Met (add Reason) Goal: Diagnostic test results will improve Outcome: Not Met (add Reason) Goal: Respiratory complications will improve Outcome: Adequate for Discharge   Problem: Activity: Goal: Risk for activity intolerance will decrease Outcome: Not Met (add Reason)

## 2023-07-25 NOTE — Progress Notes (Signed)
PT Cancellation Note  Patient Details Name: Emily Jennings MRN: [id number] DOB: 11/11/1933   Cancelled Treatment:    Reason Eval/Treat Not Completed: Other (comment) Attempted PT session, RN reports incision is bleeding and awaiting MD.  Will attempt later if appropriate and available.    Becky Sax, LPTA/CLT; CBIS [phone]  Juel Burrow 07/25/2023, 10:48 AM

## 2023-07-26 ENCOUNTER — Telehealth: Payer: Self-pay

## 2023-07-26 NOTE — Transitions of Care (Post Inpatient/ED Visit) (Signed)
   07/26/2023  Name: Emily Jennings MRN: [id number] DOB: 1934-05-25  Today's TOC FU Call Status: Today's TOC FU Call Status:: Unsuccessful Call (1st Attempt) Unsuccessful Call (1st Attempt) Date: 07/26/23  Attempted to reach the patient regarding the most recent Inpatient/ED visit.  Follow Up Plan: Additional outreach attempts will be made to reach the patient to complete the Transitions of Care (Post Inpatient/ED visit) call.   Jodelle Gross RN, BSN, CCM RN Care Manager  Transitions of Care  VBCI - [facility]  [phone]

## 2023-07-27 ENCOUNTER — Ambulatory Visit: Payer: Medicare Other | Admitting: Student

## 2023-07-27 ENCOUNTER — Telehealth: Payer: Self-pay

## 2023-07-27 ENCOUNTER — Encounter: Payer: Self-pay | Admitting: Family Medicine

## 2023-07-27 ENCOUNTER — Ambulatory Visit: Payer: Medicare Other | Admitting: Family Medicine

## 2023-07-27 VITALS — BP 148/82 | HR 95 | Ht 61.0 in | Wt 93.1 lb

## 2023-07-27 DIAGNOSIS — Z09 Encounter for follow-up examination after completed treatment for conditions other than malignant neoplasm: Secondary | ICD-10-CM | POA: Diagnosis not present

## 2023-07-27 DIAGNOSIS — N179 Acute kidney failure, unspecified: Secondary | ICD-10-CM | POA: Diagnosis not present

## 2023-07-27 DIAGNOSIS — Z794 Long term (current) use of insulin: Secondary | ICD-10-CM

## 2023-07-27 DIAGNOSIS — I10 Essential (primary) hypertension: Secondary | ICD-10-CM | POA: Diagnosis not present

## 2023-07-27 DIAGNOSIS — N184 Chronic kidney disease, stage 4 (severe): Secondary | ICD-10-CM

## 2023-07-27 DIAGNOSIS — K4030 Unilateral inguinal hernia, with obstruction, without gangrene, not specified as recurrent: Secondary | ICD-10-CM

## 2023-07-27 DIAGNOSIS — E1122 Type 2 diabetes mellitus with diabetic chronic kidney disease: Secondary | ICD-10-CM | POA: Diagnosis not present

## 2023-07-27 DIAGNOSIS — N1831 Chronic kidney disease, stage 3a: Secondary | ICD-10-CM | POA: Diagnosis not present

## 2023-07-27 LAB — CBC
Hematocrit: 28.2 % — ABNORMAL LOW (ref 34.0–46.6)
Hemoglobin: 9.2 g/dL — ABNORMAL LOW (ref 11.1–15.9)
MCH: 31.2 pg (ref 26.6–33.0)
MCHC: 32.6 g/dL (ref 31.5–35.7)
MCV: 96 fL (ref 79–97)
Platelets: 256 10*3/uL (ref 150–450)
RBC: 2.95 x10E6/uL — ABNORMAL LOW (ref 3.77–5.28)
RDW: 12.6 % (ref 11.7–15.4)
WBC: 7.4 10*3/uL (ref 3.4–10.8)

## 2023-07-27 LAB — BMP8+EGFR
BUN/Creatinine Ratio: 18 (ref 12–28)
BUN: 44 mg/dL — ABNORMAL HIGH (ref 8–27)
CO2: 26 mmol/L (ref 20–29)
Calcium: 9.2 mg/dL (ref 8.7–10.3)
Chloride: 103 mmol/L (ref 96–106)
Creatinine, Ser: 2.39 mg/dL — ABNORMAL HIGH (ref 0.57–1.00)
Glucose: 79 mg/dL (ref 70–99)
Potassium: 3.8 mmol/L (ref 3.5–5.2)
Sodium: 143 mmol/L (ref 134–144)
eGFR: 19 mL/min/{1.73_m2} — ABNORMAL LOW (ref >59)

## 2023-07-27 LAB — POCT CBG (FASTING - GLUCOSE)-MANUAL ENTRY: Glucose Fasting, POC: 101 mg/dL — AB (ref 70–99)

## 2023-07-27 NOTE — Patient Instructions (Addendum)
Please reschedule follow up to 3rd week in December, call if you need me before  GLUCERNA is the supplement for diabetics  Lungs are clear Eat small amounts often  cBC and chem 7 and eGFr stat today, we will call with result  Thanks for choosing [facility], we consider it a privelige to serve you.

## 2023-07-27 NOTE — Transitions of Care (Post Inpatient/ED Visit) (Signed)
   07/27/2023  Name: Emily Jennings MRN: [id number] DOB: 06-08-1934  Today's TOC FU Call Status: Today's TOC FU Call Status:: Unsuccessful Call (2nd Attempt) Unsuccessful Call (2nd Attempt) Date: 07/27/23  Attempted to reach the patient regarding the most recent Inpatient/ED visit.  Follow Up Plan: Additional outreach attempts will be made to reach the patient to complete the Transitions of Care (Post Inpatient/ED visit) call.   Lonia Chimera, RN, BSN, CEN [employer]- Transition of Care Team.  [facility] [phone]

## 2023-07-30 ENCOUNTER — Ambulatory Visit: Payer: Self-pay | Admitting: *Deleted

## 2023-07-30 NOTE — Patient Instructions (Signed)
Visit Information  Thank you for taking time to visit with me today. Please don't hesitate to contact me if I can be of assistance to you.   Following are the goals we discussed today:   Goals Addressed   None     Our next appointment is by telephone on 08/06/23 at 1000  Please call the care guide team at [phone] if you need to cancel or reschedule your appointment.   If you are experiencing a Mental Health or Behavioral Health Crisis or need someone to talk to, please call the Suicide and Crisis Lifeline: 988 call the [place] National Suicide Prevention Lifeline: 561-574-7596 or TTY: (774) 862-1144 TTY 3801861849) to talk to a trained counselor call 1-800-273-TALK (toll free, 24 hour hotline) call the [facility]: [phone]   No computer access, no preference for copy of AVS     The patient has been provided with contact information for the care management team and has been advised to call with any health related questions or concerns.   Santana Gosdin L. Noelle Penner, RN, BSN, [facility]  VBCI Care Management Coordinator  [phone]  Fax: [phone]

## 2023-07-30 NOTE — Patient Outreach (Incomplete)
  Care Coordination   Follow Up Visit Note   07/31/2023 Name: ANESA BOZARTH MRN: [id number] DOB: 06/26/1934  Len Blalock Kuznicki is a 89 y.o. year old female who sees [name], [name], MD for primary care. I spoke with Molly Maduro her husband  CHARRIE EIKEN by phone today.  What matters to the patients health and wellness today?  Recent hospital discharge on 07/25/23 for an incarcerated left inguinal hernia.  Transition of care services completed for 4 weeks.   Mr "bob" reports Mrs Huffstutler is doing well except sleeping a lot more. No further abdominal pain.  On Saturday, she was reported to have very loose stools that by Sunday were formed.   Pcp post hospital visit was last week 07/27/23 appetite remains poor, pcp aware requested she take in Glucerna.  Hypertension - during the last 2 days her BP value number have been within normal limits (wnl) First day home the BP was very elevated- Nadine Counts reports he believes her BP and CBG were elevated related to her she eating a slice of pie she really wanted.  Diabetes - 433 cbg value   Home health nurse in for visits active  Pneumonia- No coughing, no sputum,   congestive Heart Failure (CHF)- no swelling, coughing   Goals Addressed             This Visit's Progress    Revised goal - I will monitor for further abdominal pain, constipation & contact providers for any changes. I will manage my diet & insulin to prevent admissions.RN CM)   On track    Care Coordination Interventions: Interventions Today    Flowsheet Row Most Recent Value  Chronic Disease   Chronic disease during today's visit Diabetes, Congestive Heart Failure (CHF), Hypertension (HTN), Other  [assessed for any worsening symptoms for major medical issues plus pneumonia]  General Interventions   General Interventions Discussed/Reviewed General Interventions Reviewed, Doctor Visits  Doctor Visits Discussed/Reviewed Doctor Visits Reviewed, PCP  PCP/Specialist Visits Compliance with  follow-up visit  Exercise Interventions   Exercise Discussed/Reviewed Exercise Reviewed, Physical Activity, Weight Managment  [discussed physical activity related to constipation prevention, worsening pneumonia symptoms]  Education Interventions   Education Provided Provided Education  Provided Verbal Education On Nutrition, Exercise, Blood Sugar Monitoring, Medication  Mental Health Interventions   Mental Health Discussed/Reviewed Mental Health Reviewed, Coping Strategies  Nutrition Interventions   Nutrition Discussed/Reviewed Fluid intake, Nutrition Reviewed, Supplemental nutrition  [encouraged supplements and protein enriched foods for poor appetite. Encouraged to purchase and take in the Glucerna pcp discussed]  Pharmacy Interventions   Pharmacy Dicussed/Reviewed Pharmacy Topics Reviewed, Affording Medications              SDOH assessments and interventions completed:  Yes  SDOH Interventions Today    Flowsheet Row Most Recent Value  SDOH Interventions   Transportation Interventions Intervention Not Indicated        Care Coordination Interventions:  Yes, provided   Follow up plan: Follow up call scheduled for 08/06/23    Encounter Outcome:  Patient Visit Completed   Cala Bradford L. Noelle Penner, RN, BSN, [facility]  VBCI Care Management Coordinator  [phone]  Fax: [phone]

## 2023-07-31 NOTE — Telephone Encounter (Signed)
Message below  Copied from CRM [id number]. Topic: Clinical - Lab/Test Results >> Jul 27, 2023  2:19 PM Raven B wrote: Reason for CRM: Inetta Fermo [employer]) calling to provide lab results for PT >> Jul 30, 2023  5:49 AM Syliva Overman wrote: Labs were reviewed on 11/08 and nmurse advised to speak with pt and/ or spouse

## 2023-08-01 ENCOUNTER — Ambulatory Visit (INDEPENDENT_AMBULATORY_CARE_PROVIDER_SITE_OTHER): Payer: Medicare Other | Admitting: General Surgery

## 2023-08-01 ENCOUNTER — Encounter: Payer: Self-pay | Admitting: General Surgery

## 2023-08-01 VITALS — BP 97/55 | HR 66 | Temp 98.1°F | Resp 14 | Ht 61.0 in | Wt 95.0 lb

## 2023-08-01 DIAGNOSIS — K4130 Unilateral femoral hernia, with obstruction, without gangrene, not specified as recurrent: Secondary | ICD-10-CM

## 2023-08-01 DIAGNOSIS — K559 Vascular disorder of intestine, unspecified: Secondary | ICD-10-CM

## 2023-08-01 MED ORDER — AMOXICILLIN-POT CLAVULANATE 875-125 MG PO TABS: 1.0000 | ORAL_TABLET | Freq: Two times a day (BID) | ORAL | 0 refills | Status: AC

## 2023-08-01 NOTE — Patient Instructions (Addendum)
Continue to place the packing into the wound openings as you are able too. Drainage will continue, hopefully this will decrease each day. Call if worsening redness or pain.  Take the antibiotic to be safe to prevent any infection given the open draining wound.  No heavy lifting > 10 lbs, excessive bending, pushing, pulling, or squatting for 8 weeks after surgery.    Try metamucil or benefiber for loose stools.

## 2023-08-01 NOTE — Progress Notes (Signed)
[facility]  Doing better. Sore. Packing going well. Draining daily. Having loose stools.    BP (!) 97/55   Pulse 66   Temp 98.1 F (36.7 C) (Oral)   Resp 14   Ht 5\' 1"  (1.549 m)   Wt 95 lb (43.1 kg)   SpO2 96%   BMI 17.95 kg/m  Wound left groin less swollen, less tender, seroma evacuated with packing application, gauze applied Left staples in place for now   Patient s/p open primary repair of left femoral hernia for incarcerated hernia with SBO, SBR for ischemic bowel. Doing better.   Continue to place the packing into the wound openings as you are able too. Drainage will continue, hopefully this will decrease each day. Call if worsening redness or pain.  Take the antibiotic to be safe to prevent any infection given the open draining wound.  No heavy lifting > 10 lbs, excessive bending, pushing, pulling, or squatting for 8 weeks after surgery.    Try metamucil or benefiber for loose stools.   Future Appointments  Date Time Provider Department Center  08/06/2023 10:00 AM Clinton Gallant, RN THN-CCC None  08/09/2023  2:00 PM Lucretia Roers, MD RS-RS None  09/05/2023  2:40 PM [name], MD RPC-RPC RPC  10/08/2023  2:20 PM Shamleffer, Konrad Dolores, MD LBPC-LBENDO None  12/03/2023  9:30 AM RPC-ANNUAL WELLNESS VISIT RPC-RPC RPC   Algis Greenhouse, MD [facility] [address] Raring [place], [place] [address] [phone] (office)

## 2023-08-02 ENCOUNTER — Ambulatory Visit: Payer: Medicare Other | Admitting: Family Medicine

## 2023-08-03 ENCOUNTER — Telehealth: Payer: Self-pay

## 2023-08-03 NOTE — Telephone Encounter (Signed)
Discuss with patient how she should be taking her insulin, We went over several times and she verbalized understanding. Will callback if she gets confused again.

## 2023-08-06 ENCOUNTER — Ambulatory Visit: Payer: Self-pay | Admitting: *Deleted

## 2023-08-06 NOTE — Patient Instructions (Signed)
Visit Information  Thank you for taking time to visit with me today. Please don't hesitate to contact me if I can be of assistance to you.   Following are the goals we discussed today:   Goals Addressed             This Visit's Progress    Revised goal - I will monitor for further abdominal pain, constipation & contact providers for any changes. I will manage my diet & insulin to prevent admissions.RN CM)       Care Coordination Interventions: Interventions Today    Flowsheet Row Most Recent Value  Chronic Disease   Chronic disease during today's visit Hypertension (HTN), Other  [managing bp post stroke]  General Interventions   General Interventions Discussed/Reviewed General Interventions Reviewed, Durable Medical Equipment (DME), Doctor Visits  Doctor Visits Discussed/Reviewed Doctor Visits Reviewed, PCP, Specialist  Durable Medical Equipment (DME) BP Cuff  PCP/Specialist Visits Compliance with follow-up visit  Exercise Interventions   Exercise Discussed/Reviewed Exercise Reviewed, Physical Activity  Physical Activity Discussed/Reviewed Physical Activity Reviewed, Types of exercise  Education Interventions   Education Provided Provided Education  Provided Verbal Education On Medication, Sick Day Rules  Mental Health Interventions   Mental Health Discussed/Reviewed Mental Health Reviewed, Coping Strategies  Nutrition Interventions   Nutrition Discussed/Reviewed Nutrition Reviewed, Fluid intake  Pharmacy Interventions   Pharmacy Dicussed/Reviewed Pharmacy Topics Reviewed, Affording Medications              Our next appointment is by telephone on 08/13/23 at 1130  Please call the care guide team at [phone] if you need to cancel or reschedule your appointment.   If you are experiencing a Mental Health or Behavioral Health Crisis or need someone to talk to, please call the Suicide and Crisis Lifeline: 988 call the [place] National Suicide Prevention Lifeline:  219 337 3988 or TTY: 985-842-3724 TTY (769)527-1431) to talk to a trained counselor call 1-800-273-TALK (toll free, 24 hour hotline) call the [facility]: 281-421-1367 call 911   Patient verbalizes understanding of instructions and care plan provided today and agrees to view in MyChart. Active MyChart status and patient understanding of how to access instructions and care plan via MyChart confirmed with patient.     The patient has been provided with contact information for the care management team and has been advised to call with any health related questions or concerns.   Deklin Bieler L. Noelle Penner, RN, BSN, [facility]  VBCI Care Management Coordinator  [phone]  Fax: [phone]

## 2023-08-06 NOTE — Patient Outreach (Signed)
  Care Coordination   08/06/2023 Name: Emily Jennings MRN: [id number] DOB: 04/27/1934   Care Coordination Outreach Attempts:  An unsuccessful telephone outreach was attempted today to offer the patient information about available care coordination services.  Follow Up Plan:  Additional outreach attempts will be made to offer the patient care coordination information and services.   Encounter Outcome:  No Answer   Care Coordination Interventions:  Yes, provided    Steadman Prosperi L. Noelle Penner, RN, BSN, [facility]  VBCI Care Management Coordinator  [phone]  Fax: [phone]

## 2023-08-06 NOTE — Patient Outreach (Signed)
  Care Coordination   Follow Up Visit Note   08/06/2023 Name: BEYA ZILLMER MRN: [id number] DOB: 1934-02-01  Len Blalock Salay is a 89 y.o. year old female who sees [name], [name], MD for primary care. I spoke with  [name] by phone today.  What matters to the patients health and wellness today?  [facility] post hospital follow up outreach  Increased blood pressure management    Mrs Mccrite confirms she is working very hard to keep her blood pressure under control.   Mrs Tordoff is speaking well and is able to recall her various visits to the hospital in the last few months   Goals Addressed             This Visit's Progress    Revised goal - I will monitor for further abdominal pain, constipation & contact providers for any changes. I will manage my diet & insulin to prevent admissions.RN CM)       Care Coordination Interventions: Interventions Today    Flowsheet Row Most Recent Value  Chronic Disease   Chronic disease during today's visit Hypertension (HTN), Other  [managing bp post stroke]  General Interventions   General Interventions Discussed/Reviewed General Interventions Reviewed, Durable Medical Equipment (DME), Doctor Visits  Doctor Visits Discussed/Reviewed Doctor Visits Reviewed, PCP, Specialist  Durable Medical Equipment (DME) BP Cuff  PCP/Specialist Visits Compliance with follow-up visit  Exercise Interventions   Exercise Discussed/Reviewed Exercise Reviewed, Physical Activity  Physical Activity Discussed/Reviewed Physical Activity Reviewed, Types of exercise  Education Interventions   Education Provided Provided Education  Provided Verbal Education On Medication, Sick Day Rules  Mental Health Interventions   Mental Health Discussed/Reviewed Mental Health Reviewed, Coping Strategies  Nutrition Interventions   Nutrition Discussed/Reviewed Nutrition Reviewed, Fluid intake  Pharmacy Interventions   Pharmacy Dicussed/Reviewed Pharmacy Topics Reviewed, Affording  Medications              SDOH assessments and interventions completed:  No     Care Coordination Interventions:  Yes, provided   Follow up plan: Follow up call scheduled for 08/13/23 1130    Encounter Outcome:  Patient Visit Completed   Cala Bradford L. Noelle Penner, RN, BSN, [facility]  VBCI Care Management Coordinator  [phone]  Fax: [phone]

## 2023-08-08 ENCOUNTER — Telehealth: Payer: Self-pay

## 2023-08-08 NOTE — Telephone Encounter (Signed)
Copied from CRM [id number]. Topic: Clinical - Medication Question >> Aug 08, 2023  9:33 AM Larwance Sachs wrote: Reason for CRM: Patients representative called in regarding Dexcom g7 insulin pads to monitor wifes blood sugar. Stated she got some a few months ago and was told to request them as needed, she is out and patient called company that provides Dexcom g7 pads which told him reach out to insurance, reach insurance and was advised to reach doctors offices. States patient has been out of the pads for a few days and he would like a refill  Call back at [phone]

## 2023-08-09 ENCOUNTER — Telehealth: Payer: Self-pay | Admitting: Family Medicine

## 2023-08-09 ENCOUNTER — Encounter: Payer: Self-pay | Admitting: General Surgery

## 2023-08-09 ENCOUNTER — Ambulatory Visit: Payer: Medicare Other | Admitting: General Surgery

## 2023-08-09 VITALS — BP 92/53 | HR 64 | Temp 97.6°F | Resp 14 | Ht 61.0 in | Wt 95.0 lb

## 2023-08-09 DIAGNOSIS — K4130 Unilateral femoral hernia, with obstruction, without gangrene, not specified as recurrent: Secondary | ICD-10-CM

## 2023-08-09 NOTE — Progress Notes (Signed)
[facility]  Doing fair. Still draining from the femoral wound. Less swollen. She is getting up and moving more. Says she made some food this week.  BP (!) 92/53   Pulse 64   Temp 97.6 F (36.4 C) (Oral)   Resp 14   Ht 5\' 1"  (1.549 m)   Wt 95 lb (43.1 kg)   SpO2 94%   BMI 17.95 kg/m  Indurated and swollen left groin, remaining staples removed, minor superficial skin opening, probed and did not go deep, seroma fluid evacuated but minor, neosporin to the area and gauze bandage   Patient s/p femoral hernia repair, primary with SBR do to incarcerated SB and SBO.   Neosporin (Triple antibiotic) on the incision and gauze over the top to collect any drainage. Change at least daily. Call with any redness or pus line drainage.   Overall improving with the swelling and induration.   Future Appointments  Date Time Provider Department Center  08/13/2023 11:30 AM Clinton Gallant, RN THN-CCC None  08/14/2023 11:45 AM Lucretia Roers, MD RS-RS None  09/05/2023  2:40 PM [name], MD RPC-RPC RPC  10/08/2023  2:20 PM Shamleffer, Konrad Dolores, MD LBPC-LBENDO None  12/03/2023  9:30 AM RPC-ANNUAL WELLNESS VISIT RPC-RPC RPC   Algis Greenhouse, MD [facility] [address] R[address] [place], [place] [address] [phone] (office)

## 2023-08-09 NOTE — Patient Instructions (Signed)
Neosporin (Triple antibiotic) on the incision and gauze over the top to collect any drainage. Change at least daily. Call with any redness or pus line drainage.

## 2023-08-10 ENCOUNTER — Other Ambulatory Visit: Payer: Self-pay

## 2023-08-10 DIAGNOSIS — I10 Essential (primary) hypertension: Secondary | ICD-10-CM

## 2023-08-10 DIAGNOSIS — Z794 Long term (current) use of insulin: Secondary | ICD-10-CM

## 2023-08-10 DIAGNOSIS — E782 Mixed hyperlipidemia: Secondary | ICD-10-CM

## 2023-08-13 ENCOUNTER — Ambulatory Visit: Payer: Self-pay | Admitting: *Deleted

## 2023-08-13 NOTE — Patient Instructions (Signed)
Visit Information  Thank you for taking time to visit with me today. Please don't hesitate to contact me if I can be of assistance to you.   Following are the goals we discussed today:   Goals Addressed             This Visit's Progress    Revised goal - I will monitor for further abdominal pain, constipation & contact providers for any changes. I will manage my diet & insulin to prevent admissions.RN CM)   On track    Patient will outreach to medical staff for worsening symptoms with hypertension, hernia pain 08/13/23 denies any worsening symptoms   Care Coordination Interventions: Interventions Today    Flowsheet Row Most Recent Value  Chronic Disease   Chronic disease during today's visit Hypertension (HTN), Other  [TOC 30 follow up completion, Denies concerns]  General Interventions   General Interventions Discussed/Reviewed General Interventions Reviewed, [employer], Doctor Visits, Sick Day Rules  [Encouraged to outreach to nurse as needed during the holiday season. Reviewed pcp office hours, CM office hours, Discussed symptoms to monitor for Encouraged socialization]  Doctor Visits Discussed/Reviewed Doctor Visits Reviewed, PCP, Specialist  [Discussed 08/14/23 MD visit]  PCP/Specialist Visits Compliance with follow-up visit  Exercise Interventions   Exercise Discussed/Reviewed Exercise Reviewed, Physical Activity  [confirmed remaining active]  Physical Activity Discussed/Reviewed Physical Activity Reviewed  Education Interventions   Education Provided Provided Education  Provided Verbal Education On [employer], Other  [reviewed holiday medical services/hours encouraged use as needed]  Mental Health Interventions   Mental Health Discussed/Reviewed Mental Health Reviewed, Coping Strategies  Pharmacy Interventions   Pharmacy Dicussed/Reviewed Pharmacy Topics Reviewed, Affording Medications  [Confirmed no needs at this time]  Safety Interventions   Safety  Discussed/Reviewed Safety Reviewed, Home Safety  [Encouraged]  Home Safety Assistive Devices              Our next appointment is by telephone on 09/13/23 at 1130  Please call the care guide team at [phone] if you need to cancel or reschedule your appointment.   If you are experiencing a Mental Health or Behavioral Health Crisis or need someone to talk to, please call the Suicide and Crisis Lifeline: 988 call the [place] National Suicide Prevention Lifeline: (949) 770-7447 or TTY: (260)109-2847 TTY 6057628821) to talk to a trained counselor call 1-800-273-TALK (toll free, 24 hour hotline) call the [facility]: [phone] call 911   Patient verbalizes understanding of instructions and care plan provided today and agrees to view in MyChart. Active MyChart status and patient understanding of how to access instructions and care plan via MyChart confirmed with patient.     The patient has been provided with contact information for the care management team and has been advised to call with any health related questions or concerns.   Reana Chacko L. Noelle Penner, RN, BSN, [facility]  VBCI Care Management Coordinator  [phone]  Fax: [phone]

## 2023-08-13 NOTE — Patient Outreach (Signed)
  Care Coordination   Follow Up Visit Note   08/13/2023 Name: Emily Jennings MRN: [id number] DOB: 10/30/1933  Emily Jennings is a 89 y.o. year old female who sees Emily Jennings, Emily Mallick, MD for primary care. I spoke with  Emily Jennings by phone today.  What matters to the patients health and wellness today?  Doing well, Reports she is being more active and welcoming the holiday season. Excited about upcoming family visits Denies worsening symptoms and agrees to outreach to medical staff for any concerns during the holiday    Goals Addressed             This Visit's Progress    Revised goal - I will monitor for further abdominal pain, constipation & contact providers for any changes. I will manage my diet & insulin to prevent admissions.RN CM)   On track    Patient will outreach to medical staff for worsening symptoms with hypertension, hernia pain 08/13/23 denies any worsening symptoms   Care Coordination Interventions: Interventions Today    Flowsheet Row Most Recent Value  Chronic Disease   Chronic disease during today's visit Hypertension (HTN), Other  [TOC 30 follow up completion, Denies concerns]  General Interventions   General Interventions Discussed/Reviewed General Interventions Reviewed, [employer], Doctor Visits, Sick Day Rules  [Encouraged to outreach to nurse as needed during the holiday season. Reviewed pcp office hours, CM office hours, Discussed symptoms to monitor for Encouraged socialization]  Doctor Visits Discussed/Reviewed Doctor Visits Reviewed, PCP, Specialist  [Discussed 08/14/23 MD visit]  PCP/Specialist Visits Compliance with follow-up visit  Exercise Interventions   Exercise Discussed/Reviewed Exercise Reviewed, Physical Activity  [confirmed remaining active]  Physical Activity Discussed/Reviewed Physical Activity Reviewed  Education Interventions   Education Provided Provided Education  Provided Verbal Education On [employer], Other   [reviewed holiday medical services/hours encouraged use as needed]  Mental Health Interventions   Mental Health Discussed/Reviewed Mental Health Reviewed, Coping Strategies  Pharmacy Interventions   Pharmacy Dicussed/Reviewed Pharmacy Topics Reviewed, Affording Medications  [Confirmed no needs at this time]  Safety Interventions   Safety Discussed/Reviewed Safety Reviewed, Home Safety  [Encouraged]  Home Safety Assistive Devices              SDOH assessments and interventions completed:  Yes  SDOH Interventions Today    Flowsheet Row Most Recent Value  SDOH Interventions   Transportation Interventions Intervention Not Indicated        Care Coordination Interventions:  Yes, provided   Follow up plan: Follow up call scheduled for 09/13/23    Encounter Outcome:  Patient Visit Completed   Emily Bradford L. Noelle Penner, RN, BSN, [facility]  VBCI Care Management Coordinator  [phone]  Fax: [phone]

## 2023-08-13 NOTE — Progress Notes (Signed)
   Emily Jennings     MRN: [id number]      DOB: 05-May-1934  Chief Complaint  Patient presents with   Follow-up    Hospital follow up    HPI Emily Jennings is here for follow up of recent hospitalization from 10/30 to 07/25/2023 when she was hsopitalized with an incrcerated hiatal hernia, , had repair of femoral inguinal hernia, small bowel resection and side to side anastomosis on 07/19/2023. also AKI, hypoglycemia , uncontrolled diabetes, diastolic heart failure ROS Denies recent fever or chills. Denies sinus pressure, nasal congestion, ear pain or sore throat. Denies chest congestion, productive cough or wheezing. Denies chest pains, palpitations and leg swelling Eating small amounts , poor appetite, food does not taste good , but pushing herself to eat.  Reports good healing of surgical wound Denies headaches, seizures, numbness, or tingling.  PE  BP (!) 148/82   Pulse 95   Ht 5\' 1"  (1.549 m)   Wt 93 lb 1.9 oz (42.2 kg)   SpO2 94%   BMI 17.59 kg/m   Patient alert and oriented and in no cardiopulmonary distress.Underweight , chronically ill appearing  HEENT: No facial asymmetry, EOMI,     Neck decreased ROM  Chest: Clear to auscultation bilaterally.  CVS: S1, S2 no murmurs, no S3.Regular rate.  ABD: Soft non tender.   Ext: No edema  MS: decreased  ROM spine, shoulders, hips and knees.  Skin: Intact, no ulcerations or rash noted.  Psych: Good eye contact, normal affect. Mildly  anxious not10/30 to 07/25/2023 depressed appearing.  CNS: CN 2-12 intact, power,  normal throughout.no focal deficits noted.   Assessment & Plan  Hospital discharge follow-up Patient in for follow up of recent hospitalization from  10/30 to 07/25/2023 Discharge summary, and laboratory and radiology data are reviewed, and any questions or concerns  are discussed. Specific issues requiring follow up are specifically addressed.   Acute on chronic renal failure (HCC) Lab post discharge  to  assess stability  CKD stage 4 due to type 2 diabetes mellitus (HCC) Managed by Nephrology,improved function at time of d/c , as had acute injury during illness  Incarcerated left inguinal hernia S/p surgical resection during recent hospitalization, tolerating regular diet , however reports [poor appetite but is working on this, will drink supplements to boost intake  Type 2 diabetes mellitus with stage 3a chronic kidney disease, with long-term current use of insulin (HCC) Uncontrolled diabetes associated with hypertension, chronic kidney disease, hypertension and hyperlipidemia. Marked fluctuation in blood sugar persists, recommend at least twice daily testing , eating small amounts often and testing at least 3 times / ay. Managed by Endo and needs to keep follow up

## 2023-08-13 NOTE — Progress Notes (Incomplete)
   Emily Jennings     MRN: [id number]      DOB: 1934-04-16  Chief Complaint  Patient presents with  . Follow-up    Hospital follow up    HPI Emily Jennings is here for follow up of recent hospitalization from 10/30 to 07/25/2023 when she was hsopitalized with an incrcerated hiatal hernia, , had repair of femoral inguinal hernia, small bowel resection and side to side anastomosis on 07/19/2023. also AKI, hypoglycemia , uncontrolled diabetes, diastolic heart failure ROS Denies recent fever or chills. Denies sinus pressure, nasal congestion, ear pain or sore throat. Denies chest congestion, productive cough or wheezing. Denies chest pains, palpitations and leg swelling Eating small amoutns , poor appetite, food does not taste good , but psihing herself to eat. Denies nausea , vomit diarhrheah or constipation Reports good healing of surgical wound Denies headaches, seizures, numbness, or tingling. Denies depression, anxiety or insomnia. Denies skin break down or rash.   PE  BP (!) 148/82   Pulse 95   Ht 5\' 1"  (1.549 m)   Wt 93 lb 1.9 oz (42.2 kg)   SpO2 94%   BMI 17.59 kg/m   Patient alert and oriented and in no cardiopulmonary distress.  HEENT: No facial asymmetry, EOMI,     Neck supple .  Chest: Clear to auscultation bilaterally.  CVS: S1, S2 no murmurs, no S3.Regular rate.  ABD: Soft non tender.   Ext: No edema  MS: Adequate ROM spine, shoulders, hips and knees.  Skin: Intact, no ulcerations or rash noted.  Psych: Good eye contact, normal affect. Memory intact not anxious or depressed appearing.  CNS: CN 2-12 intact, power,  normal throughout.no focal deficits noted.   Assessment & Plan  No problem-specific Assessment & Plan notes found for this encounter.

## 2023-08-14 ENCOUNTER — Ambulatory Visit (INDEPENDENT_AMBULATORY_CARE_PROVIDER_SITE_OTHER): Payer: Medicare Other | Admitting: General Surgery

## 2023-08-14 ENCOUNTER — Ambulatory Visit: Payer: Medicare Other | Admitting: Internal Medicine

## 2023-08-14 VITALS — BP 87/49 | HR 68 | Temp 97.6°F | Resp 12 | Ht 61.0 in | Wt 97.0 lb

## 2023-08-14 DIAGNOSIS — K4130 Unilateral femoral hernia, with obstruction, without gangrene, not specified as recurrent: Secondary | ICD-10-CM

## 2023-08-14 DIAGNOSIS — N179 Acute kidney failure, unspecified: Secondary | ICD-10-CM | POA: Insufficient documentation

## 2023-08-14 NOTE — Assessment & Plan Note (Signed)
Managed by Nephrology,improved function at time of d/c , as had acute injury during illness

## 2023-08-14 NOTE — Assessment & Plan Note (Signed)
S/p surgical resection during recent hospitalization, tolerating regular diet , however reports [poor appetite but is working on this, will drink supplements to boost intake

## 2023-08-14 NOTE — Patient Instructions (Signed)
Monitor for signs of continue urination or if any new symptoms like burning. Call the office.  If you are having any burning with the urine, call the office. The office will be closing at 12:00PM on 08/15/2023 and will be closed until Monday 08/20/2023.  Continue to keep neosporin on the wound and keep it covered. The wound for the next day or two may drain a greyish drainage due to the silver nitrate stick I used on the wound.  Call with any redness or pus line drainage.  If you have issues over the holiday call [phone] or [phone] and ask to speak to Dr. Robyne Peers.

## 2023-08-14 NOTE — Assessment & Plan Note (Signed)
Uncontrolled diabetes associated with hypertension, chronic kidney disease, hypertension and hyperlipidemia. Marked fluctuation in blood sugar persists, recommend at least twice daily testing , eating small amounts often and testing at least 3 times / ay. Managed by Endo and needs to keep follow up

## 2023-08-14 NOTE — Assessment & Plan Note (Signed)
Lab post discharge  to assess stability

## 2023-08-14 NOTE — Assessment & Plan Note (Addendum)
Patient in for follow up of recent hospitalization from  10/30 to 07/25/2023 Discharge summary, and laboratory and radiology data are reviewed, and any questions or concerns  are discussed. Specific issues requiring follow up are specifically addressed.

## 2023-08-14 NOTE — Progress Notes (Unsigned)
[facility]  Future Appointments  Date Time Provider Department Center  08/20/2023  1:00 PM Roma Kayser, MD REA-REA None  08/22/2023  1:30 PM Lucretia Roers, MD RS-RS None  09/05/2023  2:40 PM [name], MD RPC-RPC [facility]  09/13/2023 11:30 AM Clinton Gallant, RN THN-CCC None  10/08/2023  2:20 PM Shamleffer, Konrad Dolores, MD LBPC-LBENDO None  12/03/2023  9:30 AM RPC-ANNUAL WELLNESS VISIT RPC-RPC RPC

## 2023-08-20 ENCOUNTER — Ambulatory Visit: Payer: Medicare Other | Admitting: "Endocrinology

## 2023-08-22 ENCOUNTER — Encounter: Payer: Self-pay | Admitting: General Surgery

## 2023-08-22 ENCOUNTER — Ambulatory Visit (INDEPENDENT_AMBULATORY_CARE_PROVIDER_SITE_OTHER): Payer: Medicare Other | Admitting: General Surgery

## 2023-08-22 VITALS — BP 97/52 | HR 67 | Temp 97.5°F | Resp 12 | Ht 61.0 in | Wt 106.0 lb

## 2023-08-22 DIAGNOSIS — K4130 Unilateral femoral hernia, with obstruction, without gangrene, not specified as recurrent: Secondary | ICD-10-CM

## 2023-08-22 NOTE — Patient Instructions (Signed)
Expect some greyish drainage on the dressing from the silver nitrate. Diet as tolerated. Continue to not lift > 10 lbs.  Call if any concerns or questions. Drainage should slow down more/ st op in the next 2 weeks. Call if worsening redness, pain or pus like drainage

## 2023-08-23 NOTE — Progress Notes (Signed)
[facility]  Patient doing well. Less drainage. No further urinary symptoms. Is getting up and moving more. Had a good thanksgiving.  BP (!) 97/52   Pulse 67   Temp (!) 97.5 F (36.4 C) (Oral)   Resp 12   Ht 5\' 1"  (1.549 m)   Wt 106 lb (48.1 kg)   SpO2 98%   BMI 20.03 kg/m  Soft, less edema in the right groin, the small superficial sites open < 2mm in size and probed, silver nitrate applied, no signs of infection or redness  Patient s/p left femoral hernia repair and SBR. Improving. Wound slowly closing.  Expect some greyish drainage on the dressing from the silver nitrate. Diet as tolerated. Continue to not lift > 10 lbs.  Call if any concerns or questions. Drainage should slow down more/ stop in the next 2 weeks. Call if worsening redness, pain or pus like drainage   Future Appointments  Date Time Provider Department Center  09/05/2023  2:40 PM [name], MD RPC-RPC [facility]  09/06/2023  1:45 PM Lucretia Roers, MD RS-RS None  09/13/2023 11:30 AM Clinton Gallant, RN THN-CCC None  10/08/2023  2:20 PM Shamleffer, Konrad Dolores, MD LBPC-LBENDO None  12/03/2023  9:30 AM RPC-ANNUAL WELLNESS VISIT RPC-RPC RPC     Algis Greenhouse, MD [facility] [address] [place], [place] [address] [phone] (office)

## 2023-09-05 ENCOUNTER — Encounter: Payer: Self-pay | Admitting: Family Medicine

## 2023-09-05 ENCOUNTER — Ambulatory Visit (INDEPENDENT_AMBULATORY_CARE_PROVIDER_SITE_OTHER): Payer: Medicare Other | Admitting: Family Medicine

## 2023-09-05 VITALS — BP 100/50 | HR 72 | Ht 61.0 in | Wt 104.1 lb

## 2023-09-05 DIAGNOSIS — E785 Hyperlipidemia, unspecified: Secondary | ICD-10-CM | POA: Diagnosis not present

## 2023-09-05 DIAGNOSIS — E1122 Type 2 diabetes mellitus with diabetic chronic kidney disease: Secondary | ICD-10-CM

## 2023-09-05 DIAGNOSIS — I10 Essential (primary) hypertension: Secondary | ICD-10-CM

## 2023-09-05 DIAGNOSIS — I5032 Chronic diastolic (congestive) heart failure: Secondary | ICD-10-CM

## 2023-09-05 DIAGNOSIS — N184 Chronic kidney disease, stage 4 (severe): Secondary | ICD-10-CM

## 2023-09-05 DIAGNOSIS — Z794 Long term (current) use of insulin: Secondary | ICD-10-CM | POA: Diagnosis not present

## 2023-09-05 DIAGNOSIS — E1169 Type 2 diabetes mellitus with other specified complication: Secondary | ICD-10-CM | POA: Diagnosis not present

## 2023-09-05 NOTE — Patient Instructions (Addendum)
Annual exam mid February, call if you need me sooner  Medications are to be taken as listed below  Limit water to 2 L/day so as not to put extra strain on your heart, and drink most of your water before 7 pm you may need to go to bathroom overnight less often if you do this  Medication is to be taken as listed below,  Nifedipine one daily Carvedilol 12.5 one twice daily Hydralazine 100 one twice daily  Furosemide 40 mg once daily Potassium one daily   Rosuvasatain 5 mg one daily  Calcitriol 0.25 mg one three times weekly  Dispose safely of medications in gloves which you are not taking  Refer for nurse  eval/ manageement   for complex med regime through  [facility] pls if not already being seen  Best dfor 2025!  Thanks for choosing [facility], we consider it a privelige to serve you.

## 2023-09-06 ENCOUNTER — Ambulatory Visit: Payer: Medicare Other | Admitting: General Surgery

## 2023-09-06 VITALS — BP 187/70 | HR 69 | Temp 97.8°F | Resp 16 | Ht 61.0 in | Wt 109.0 lb

## 2023-09-06 DIAGNOSIS — K4130 Unilateral femoral hernia, with obstruction, without gangrene, not specified as recurrent: Secondary | ICD-10-CM

## 2023-09-06 NOTE — Patient Instructions (Addendum)
May have some silver/ grey drainage on gauze. Continue gauze and ointment for about 1-2 more weeks. After that this are should be fully healed. The swelling should continue to improve.  Will see in January to ensure area swelling is improving.

## 2023-09-06 NOTE — Progress Notes (Signed)
[facility]  Doing well. Minimal to no drainage.  BP (!) 187/70   Pulse 69   Temp 97.8 F (36.6 C) (Oral)   Resp 16   Ht 5\' 1"  (1.549 m)   Wt 109 lb (49.4 kg)   SpO2 95%   BMI 20.60 kg/m  Right groin incision healing, hypertropic granulation on two areas, silver nitrate applied Swollen tissue but no obvious signs of recurrence  Patient s/p right femoral hernia repair and SBR for incarcerated hernia and SBO. Doing well.  May have some silver/ grey drainage on gauze. Continue gauze and ointment for about 1-2 more weeks. After that this are should be fully healed. The swelling should continue to improve.  Will see in January to ensure area swelling is improving.   Future Appointments  Date Time Provider Department Center  09/13/2023 11:30 AM Clinton Gallant, RN THN-CCC None  10/08/2023  2:20 PM Shamleffer, Konrad Dolores, MD LBPC-LBENDO None  10/18/2023  1:15 PM Lucretia Roers, MD RS-RS None  10/24/2023  3:00 PM [name], MD RPC-RPC [facility]  11/19/2023 10:00 AM RPC-ANNUAL WELLNESS VISIT RPC-RPC RPC    Algis Greenhouse, MD [facility] [address] [place], [place] [address] [phone] (office)

## 2023-09-13 ENCOUNTER — Ambulatory Visit: Payer: Self-pay | Admitting: *Deleted

## 2023-09-13 NOTE — Patient Outreach (Signed)
  Care Coordination   09/13/2023 Name: Emily Jennings MRN: [id number] DOB: 04-Jun-1934   Care Coordination Outreach Attempts:  An unsuccessful outreach was attempted for an appointment today.  Follow Up Plan:  Additional outreach attempts will be made to offer the patient complex care management information and services.   Encounter Outcome:  No Answer   Care Coordination Interventions:  No, not indicated     Darby Fleeman L. Noelle Penner, RN, BSN, [facility]  VBCI Care Management Coordinator  [phone]  Fax: [phone]

## 2023-09-25 ENCOUNTER — Encounter: Payer: Self-pay | Admitting: Family Medicine

## 2023-09-25 DIAGNOSIS — E1169 Type 2 diabetes mellitus with other specified complication: Secondary | ICD-10-CM | POA: Insufficient documentation

## 2023-09-25 NOTE — Assessment & Plan Note (Signed)
 Hyperlipidemia:Low fat diet discussed and encouraged.   Lipid Panel  Lab Results  Component Value Date   CHOL 192 11/24/2021   HDL 118 11/24/2021   LDLCALC 65 11/24/2021   TRIG 66 05/18/2023   CHOLHDL 1.6 11/24/2021     Updated lab needed at/ before next visit.

## 2023-09-25 NOTE — Assessment & Plan Note (Signed)
 Followed by Nephrology, EGFR 18

## 2023-09-25 NOTE — Assessment & Plan Note (Signed)
 Currently stable , no s/s of decompensation at visit

## 2023-09-25 NOTE — Assessment & Plan Note (Signed)
 Diabetes associated with hypertension, hyperlipidemia, and CKD  Emily Jennings is reminded of the importance of commitment to daily physical activity for 30 minutes or more, as able and the need to limit carbohydrate intake to 30 to 60 grams per meal to help with blood sugar control.   The need to take medication as prescribed, test blood sugar as directed, and to call between visits if there is a concern that blood sugar is uncontrolled is also discussed.   Emily Jennings is reminded of the importance of daily foot exam, annual eye examination, and good blood sugar, blood pressure and cholesterol control.     Latest Ref Rng & Units 07/27/2023    9:15 AM 07/25/2023    4:21 AM 07/23/2023    4:48 AM 07/21/2023    5:05 AM 07/20/2023    8:29 AM  Diabetic Labs  Creatinine 0.57 - 1.00 mg/dL 7.60  7.41  7.17  6.04  4.69       09/06/2023    1:38 PM 09/05/2023    3:37 PM 09/05/2023    2:36 PM 08/22/2023    1:22 PM 08/14/2023   11:28 AM 08/09/2023    3:06 PM 08/01/2023    3:46 PM  BP/Weight  Systolic BP 187 100 97 97 87 92 97  Diastolic BP 70 50 50 52 49 53 55  Wt. (Lbs) 109  104.12 106 97 95 95  BMI 20.6 kg/m2  19.67 kg/m2 20.03 kg/m2 18.33 kg/m2 17.95 kg/m2 17.95 kg/m2      Latest Ref Rng & Units 11/10/2020    1:18 PM 11/10/2020   12:00 AM  Foot/eye exam completion dates  Eye Exam No Retinopathy No Retinopathy     No Retinopathy         This result is from an external source.

## 2023-09-25 NOTE — Assessment & Plan Note (Signed)
 Over corrected , medications reviewed and she is to take as listed

## 2023-09-26 ENCOUNTER — Telehealth: Payer: Self-pay | Admitting: Cardiology

## 2023-09-26 ENCOUNTER — Ambulatory Visit: Payer: Self-pay | Admitting: *Deleted

## 2023-09-26 NOTE — Patient Outreach (Signed)
  Care Coordination   09/26/2023 Name: Emily Jennings MRN: [id number] DOB: 24-Aug-1934   Care Coordination Outreach Attempts:  An unsuccessful outreach was attempted for an appointment today.  Follow Up Plan:  Additional outreach attempts will be made to offer the patient complex care management information and services.   Encounter Outcome:  No Answer   Care Coordination Interventions:  No, not indicated    Urho Rio L. Ramonita, RN, BSN, [facility]  VBCI Care Management Coordinator  [phone]  Fax: [phone]

## 2023-09-26 NOTE — Telephone Encounter (Signed)
 Pt c/o BP issue: STAT if pt c/o blurred vision, one-sided weakness or slurred speech  1. What are your last 5 BP readings? Today systolic 221, 09/06/23 Bp was 812/29 this was all Suzen had stated pt had more readings  2. Are you having any other symptoms (ex. Dizziness, headache, blurred vision, passed out)? Headache  3. What is your BP issue? Wants meds reviewed due to elevation in BP. Effecting DM

## 2023-09-26 NOTE — Patient Instructions (Signed)
 Visit Information  Thank you for taking time to visit with me today. Please don't hesitate to contact me if I can be of assistance to you.   Following are the goals we discussed today:   Goals Addressed             This Visit's Progress    Patient will monitor for elevations in BPs, cbgs, and urine color changes, & contact providers for any changes. I will manage my diet & insulin  to prevent admissions.RN CM)   Not on track    Patient will outreach to medical staff for worsening symptoms with hypertension, hernia pain 09/26/23 having elevated BP for 40-5 weeks but did not report to any MD-  09/26/23 not having concerns with constipation at this time    Care Coordination Interventions: Interventions Today    Flowsheet Row Most Recent Value  Chronic Disease   Chronic disease during today's visit Hypertension (HTN), Diabetes, Chronic Kidney Disease/End Stage Renal Disease (ESRD)  General Interventions   General Interventions Discussed/Reviewed General Interventions Reviewed, Durable Medical Equipment (DME), Sick Day Rules, Doctor Visits, Communication with  Doctor Visits Discussed/Reviewed Doctor Visits Reviewed, Specialist  Durable Medical Equipment (DME) BP Cuff, Glucomoter  PCP/Specialist Visits --  [spoke with April at cardiology office to provide a message to Dr Alvan and/or his nurse r/t elevated BPs for 4-5 weeks APril will have a nurse reach out to patient/spouse]  Communication with PCP/Specialists, RN  Education Interventions   Education Provided Provided [employer], Provided Education  [uncontrolled levels of blood sugar in diabetes and elevated bllod pressures can directly damage the kidneys' filtering system, increasingconcerns with chronic kidney disease (CKD) Encouraged to monitor cbg, BP, urine color, increase fluids]  Provided Verbal Education On Blood Sugar Monitoring, Medication  Mental Health Interventions   Mental Health Discussed/Reviewed Coping Strategies,  Mental Health Reviewed                Our next appointment is by telephone on 10/01/23 at 2:30 pm  Please call the care guide team at [phone] if you need to cancel or reschedule your appointment.   If you are experiencing a Mental Health or Behavioral Health Crisis or need someone to talk to, please call the Suicide and Crisis Lifeline: 988 call the [place]  National Suicide Prevention Lifeline: (719)441-6156 or TTY: 504-099-3876 TTY 7808701720) to talk to a trained counselor call 1-800-273-TALK (toll free, 24 hour hotline) call the [facility]: [phone] call 911   No computer access, no preference for copy of AVS      The patient has been provided with contact information for the care management team and has been advised to call with any health related questions or concerns.   Ilianna Bown L. Ramonita, RN, BSN, [facility]  VBCI Care Management Coordinator  [phone]  Fax: [phone]

## 2023-09-26 NOTE — Patient Outreach (Signed)
 Care Coordination   Follow Up Visit Note   09/26/2023 Name: Emily Jennings MRN: [id number] DOB: [birth year]  Emily Jennings is a 89 y.o. year old female who sees Emily Jennings, Emily BRAVO, MD for primary care. I spoke with  Emily Jennings by phone today.  What matters to the patients health and wellness today?  Elevated blood pressures (BP) medical history of Chronic Kidney disease (CKD), diabetes,   This morning systolic BP was 221. She reports elevations like these for 4-5 weeks  On 09/06/23 MD visit she was 187/70 She checks her blood when she gets up in the mornings. States she uses minimal salt. Every once in a while has a headache. She has not had syncope, chest pain Last seen by urology Emily Jennings in May 2024 - on 10/10/23 is the next urology visit  Diabetes  She informs RN CM that her cbg goes up when her BP goes down, CBG goes down when BP goes up per patient She and Emily Jennings voiced understanding of the correlation of  Hypertension (HTN) & Diabetes to  Chronic Kidney disease (CKD) She will monitor and document her cbgs, BPs and check her urine color changes She reports today she had not been paying attention to my urine She reports that she is taking her cardiac medicines as ordered- spironolactone , nifedipine , hydralazine    She agrees for RN CM to speak with her pcp and/or cardiologist about her symptoms        Goals Addressed             This Visit's Progress    Patient will monitor for elevations in BPs, cbgs, and urine color changes, & contact providers for any changes. I will manage my diet & insulin  to prevent admissions.RN CM)   Not on track    Patient will outreach to medical staff for worsening symptoms with hypertension, hernia pain 09/26/23 having elevated BP for 40-5 weeks but did not report to any MD-  09/26/23 not having concerns with constipation at this time    Care Coordination Interventions: Interventions Today    Flowsheet Row Most Recent Value  Chronic Disease    Chronic disease during today's visit Hypertension (HTN), Diabetes, Chronic Kidney Disease/End Stage Renal Disease (ESRD)  General Interventions   General Interventions Discussed/Reviewed General Interventions Reviewed, Durable Medical Equipment (DME), Sick Day Rules, Doctor Visits, Communication with  Doctor Visits Discussed/Reviewed Doctor Visits Reviewed, Specialist  Durable Medical Equipment (DME) BP Cuff, Glucomoter  PCP/Specialist Visits --  [spoke with April at cardiology office to provide a message to Emily Alvan and/or his nurse r/t elevated BPs for 4-5 weeks APril will have a nurse reach out to patient/spouse]  Communication with PCP/Specialists, RN  Education Interventions   Education Provided Provided [employer], Provided Education  [uncontrolled levels of blood sugar in diabetes and elevated bllod pressures can directly damage the kidneys' filtering system, increasingconcerns with chronic kidney disease (CKD) Encouraged to monitor cbg, BP, urine color, increase fluids]  Provided Verbal Education On Blood Sugar Monitoring, Medication  Mental Health Interventions   Mental Health Discussed/Reviewed Coping Strategies, Mental Health Reviewed                SDOH assessments and interventions completed:  No     Care Coordination Interventions:  Yes, provided   Follow up plan: Follow up call scheduled for 10/01/23    Encounter Outcome:  Patient Visit Completed   Emily L. Ramonita, RN, BSN, CCM  VBCI Care Management Coordinator  [phone]  Fax: [phone]

## 2023-09-27 ENCOUNTER — Ambulatory Visit: Payer: Self-pay | Admitting: *Deleted

## 2023-09-27 MED ORDER — NIFEDIPINE ER 60 MG PO TB24: 60.0000 mg | ORAL_TABLET | Freq: Every day | ORAL | 2 refills | Status: DC

## 2023-09-27 NOTE — Telephone Encounter (Signed)
I will forward to B.Ahmed Prima, PA-C for review.

## 2023-09-27 NOTE — Telephone Encounter (Signed)
 Left message to return call

## 2023-09-27 NOTE — Telephone Encounter (Signed)
 Copied from CRM [id number]. Topic: Clinical - Medication Question >> Sep 27, 2023 10:54 AM Shelah Lewandowsky wrote: Reason for CRM: Still waiting for call back from nurse about medication for high blood pressure, please call patient [phone]

## 2023-09-27 NOTE — Telephone Encounter (Signed)
 Suzen with [facility] Care coordination states she reconciled patients medication with spouse.The only medication he could not find is Nifedipine . This was last filled in October. Per B.Strader,PA-C, this may explain why her BP is up if she is not taking medication. I [facility] refill [facility] and asked if they would switch patient to Pill[facility].

## 2023-09-27 NOTE — Telephone Encounter (Signed)
   Her last office visit with us  was in 01/2023 and she was taking Amlodipine -Olmesartan  5-40 mg daily and Coreg  12.5 mg twice daily.  In the interim, it appears she has been hospitalized multiple times and at the time of her hospitalization in 07/2023, she did have an AKI and was discharged on Hydralazine  100 mg 3 times daily, Nifedipine  60 mg daily and Spironolactone  50 mg daily. Appears she was hypotensive when she saw Dr. Antonetta in 08/2023 with BP at 100/50 and was listed as taking Nifedipine  60 mg daily, Coreg  12.5 mg twice daily and Hydralazine  100 mg twice daily.  Please confirm with the patient what she is currently taking if she wants us  to adjust medications, otherwise this needs to go to her PCP who just adjusted her medications last month.  Signed, Laymon CHRISTELLA Qua, PA-C 09/27/2023, 10:39 AM Pager: [phone]

## 2023-10-01 ENCOUNTER — Ambulatory Visit: Payer: Self-pay | Admitting: *Deleted

## 2023-10-01 NOTE — Patient Outreach (Signed)
  Care Coordination   10/01/2023 Name: Emily Jennings MRN: [id number] DOB: 29-Jan-1934   Care Coordination Outreach Attempts:  An unsuccessful outreach was attempted for an appointment today.  Follow Up Plan:  Additional outreach attempts will be made to offer the patient complex care management information and services.   Encounter Outcome:  No Answer   Care Coordination Interventions:  No, not indicated    Juluis Fitzsimmons L. Ramonita, RN, BSN, [facility]  VBCI Care Management Coordinator  [phone]  Fax: [phone]

## 2023-10-02 ENCOUNTER — Ambulatory Visit: Payer: Medicare Other | Admitting: *Deleted

## 2023-10-02 NOTE — Patient Outreach (Signed)
  Care Coordination   Follow Up Visit Note   10/03/2023 Name: Emily Jennings MRN: [id number] DOB: June 03, 1934  Emily Jennings is a 89 y.o. year old female who sees Antonetta Rollene BRAVO, MD for primary care. I spoke with  Emily Jennings by phone today.  What matters to the patients health and wellness today?  Hypertension & diabetes monitoring, medication effects  Hypertension 201 this morning 0800, took hypertension medicines, 118/54, held BP medicine  Trend of the morning SBP being elevated at night was 200+ denies headache or dizzy, no visual changes  Need parameter  Diabetes Last night & this morning SBP & cbg were elevated      Still unsure about fluids diabetics can drink   Goals Addressed             This Visit's Progress    Patient will monitor for elevations in BPs, cbgs, and urine color changes, & contact providers for any changes. I will manage my diet & insulin  to prevent admissions.RN CM)       Patient will outreach to medical staff for worsening symptoms with hypertension, hernia pain 09/26/23 having elevated BP for 4-5 weeks but did not report to any MD-  09/26/23 not having concerns with constipation at this time  10/03/23 continues to have elevations of SBP in mornings and night but reports she is taking her medications. Awaiting pill packaging from [facility]   Care Coordination Interventions: Interventions Today    Flowsheet Row Most Recent Value  Chronic Disease   Chronic disease during today's visit Diabetes, Other, Hypertension (HTN)  General Interventions   General Interventions Discussed/Reviewed General Interventions Reviewed, Doctor Visits, Communication with  Doctor Visits Discussed/Reviewed Doctor Visits Reviewed, PCP  PCP/Specialist Visits Compliance with follow-up visit  Communication with PCP/Specialists  [spoke with pcp in person about patient's hypertension, pill packaging]  Education Interventions   Education Provided Provided Education  Provided  Verbal Education On Nutrition, Blood Sugar Monitoring, Medication  Mental Health Interventions   Mental Health Discussed/Reviewed Mental Health Reviewed, Coping Strategies  Nutrition Interventions   Nutrition Discussed/Reviewed Nutrition Reviewed, Fluid intake, Decreasing sugar intake, Decreasing salt  Pharmacy Interventions   Pharmacy Dicussed/Reviewed Pharmacy Topics Reviewed, Medications and their functions, Medication Adherence  Medication Adherence Not taking medication  [question if patient is taking medications as ordered]                SDOH assessments and interventions completed:  No     Care Coordination Interventions:  Yes, provided   Follow up plan: Follow up call scheduled for 10/03/23    Encounter Outcome:  Patient Visit Completed   Suzen L. Ramonita, RN, BSN, [facility]  VBCI Care Management Coordinator  [phone]  Fax: [phone]

## 2023-10-03 ENCOUNTER — Ambulatory Visit: Payer: Medicare Other | Admitting: *Deleted

## 2023-10-03 NOTE — Patient Outreach (Signed)
  Care Coordination   Follow Up Visit Note   10/03/2023 Name: Emily Jennings MRN: [id number] DOB: 06-29-1934  Emily Jennings is a 89 y.o. year old female who sees Antonetta Rollene BRAVO, MD for primary care. I spoke with  Emily Jennings and spouse, [name] by phone today.  What matters to the patients health and wellness today?  Elevated SBP, questionable if taking medications correctly- Gives herself her own medicines    Goals Addressed             This Visit's Progress    Patient will monitor for elevations in BPs, cbgs, and urine color changes, & contact providers for any changes. I will manage my diet & insulin  to prevent admissions.RN CM)       Patient will outreach to medical staff for worsening symptoms with hypertension, hernia pain  09/26/23 having elevated BP for 4-5 weeks but did not report to any MD-  09/26/23 not having concerns with constipation at this time   Care Coordination Interventions: Interventions Today    Flowsheet Row Most Recent Value  Chronic Disease   Chronic disease during today's visit Diabetes, Other, Hypertension (HTN)  General Interventions   General Interventions Discussed/Reviewed General Interventions Reviewed, Doctor Visits, Communication with  Doctor Visits Discussed/Reviewed Doctor Visits Reviewed, PCP  PCP/Specialist Visits Compliance with follow-up visit  Communication with PCP/Specialists  [spoke with pcp in person about patient's hypertension, pill packaging]  Education Interventions   Education Provided Provided Education  Provided Verbal Education On Nutrition, Blood Sugar Monitoring, Medication  Mental Health Interventions   Mental Health Discussed/Reviewed Mental Health Reviewed, Coping Strategies  Nutrition Interventions   Nutrition Discussed/Reviewed Nutrition Reviewed, Fluid intake, Decreasing sugar intake, Decreasing salt  Pharmacy Interventions   Pharmacy Dicussed/Reviewed Pharmacy Topics Reviewed, Medications and their functions,  Medication Adherence  Medication Adherence Not taking medication  [question if patient is taking medications as ordered]                SDOH assessments and interventions completed:  No     Care Coordination Interventions:  Yes, provided   Follow up plan: Follow up call scheduled for 10/01/23    Encounter Outcome:  Patient Visit Completed    Suzen L. Ramonita, RN, BSN, [facility]  VBCI Care Management Coordinator  [phone]  Fax: [phone]

## 2023-10-03 NOTE — Patient Outreach (Addendum)
  Care Coordination   Follow Up Visit Note   10/03/2023 Name: Emily Jennings MRN: [id number] DOB: 1934/02/07  Emily Jennings is a 89 y.o. year old female who sees Towanda Fret, MD for primary care. I spoke with  [name] by phone today.  What matters to the patients health and wellness today?  Hypertension, Diabetes, pill packaging  SBP 206 this morning  She is presently napping after getting up early Continues to have an elevated SBP in the mornings  Diabetes  inquired about swelling of feet and drinking milk She reports having her feet down too much on 10/02/23 but have them elevated now She still is not able to have some memory concerns with remembering what she can drink for her diabetic diet  She agreed to await the list of foods & drinks she is able to take in the mail   Goals Addressed             This Visit's Progress    Patient will monitor for elevations in BPs, cbgs, and urine color changes, & contact providers for any changes. I will manage my diet & insulin  to prevent admissions.RN CM)   Not on track    Patient will outreach to medical staff for worsening symptoms with hypertension, hernia pain 09/26/23 having elevated BP for 4-5 weeks but did not report to any MD-  09/26/23 not having concerns with constipation at this time  10/03/23 continues to have elevations of SBP in mornings and night but reports she is taking her medications. Awaiting pill packaging from [facility]   Care Coordination Interventions: Interventions Today    Flowsheet Row Most Recent Value  Chronic Disease   Chronic disease during today's visit Diabetes, Hypertension (HTN), Other  [assessed this morning's SBP value, discussed continued elevations in the mornings.  Discussed pending pill packaging from [facility].  Reviewed drinks preferred for her diabetic diet. Encouraged elevation of her feet & rest]  General Interventions   General Interventions Discussed/Reviewed General Interventions Reviewed,  Sick Day Rules, Doctor Visits  Doctor Visits Discussed/Reviewed Doctor Visits Reviewed, PCP  PCP/Specialist Visits Compliance with follow-up visit  Education Interventions   Education Provided Provided Education, Provided Printed Education  Provided Verbal Education On Nutrition, Foot Care, Blood Sugar Monitoring, Medication, Sick Day Rules, Other, Insurance Plans  [employer], decreasing swelling of feet with elevation, encourage to take HTN meds as ordered, [facility] packs pending]  Nutrition Interventions   Nutrition Discussed/Reviewed Nutrition Reviewed, Fluid intake, Decreasing sugar intake  [discussed types of artificial sweetners]  Pharmacy Interventions   Pharmacy Dicussed/Reviewed Pharmacy Topics Reviewed, Medications and their functions, Medication Adherence  Medication Adherence Not taking medication  [encouraged to take medicines as ordered]                SDOH assessments and interventions completed:  No     Care Coordination Interventions:  Yes, provided   Follow up plan: Follow up call scheduled for 10/19/23    Encounter Outcome:  Patient Visit Completed   Jullie Oiler L. Mcarthur Speedy, RN, BSN, [facility]  VBCI Care Management Coordinator  [phone]  Fax: [phone]

## 2023-10-03 NOTE — Patient Instructions (Addendum)
 Visit Information  Thank you for taking time to visit with me today. Please don't hesitate to contact me if I can be of assistance to you.   Following are the goals we discussed today:   Goals Addressed             This Visit's Progress    Patient will monitor for elevations in BPs, cbgs, and urine color changes, & contact providers for any changes. I will manage my diet & insulin  to prevent admissions.RN CM)       Patient will outreach to medical staff for worsening symptoms with hypertension, hernia pain 09/26/23 having elevated BP for 4-5 weeks but did not report to any MD-  09/26/23 not having concerns with constipation at this time  10/03/23 continues to have elevations of SBP in mornings and night but reports she is taking her medications. Awaiting pill packaging from [facility]   Care Coordination Interventions: Interventions Today    Flowsheet Row Most Recent Value  Chronic Disease   Chronic disease during today's visit Diabetes, Hypertension (HTN), Other  [assessed this morning's SBP value, discussed continued elevations in the mornings.  Discussed pending pill packaging from [facility].  Reviewed drinks preferred for her diabetic diet. Encouraged elevation of her feet & rest]  General Interventions   General Interventions Discussed/Reviewed General Interventions Reviewed, Sick Day Rules, Doctor Visits  Doctor Visits Discussed/Reviewed Doctor Visits Reviewed, PCP  PCP/Specialist Visits Compliance with follow-up visit  Education Interventions   Education Provided Provided Education, Provided Printed Education  Provided Verbal Education On Nutrition, Foot Care, Blood Sugar Monitoring, Medication, Sick Day Rules, Other, Insurance Plans  [employer], decreasing swelling of feet with elevation, encourage to take HTN meds as ordered, [facility] packs pending]  Nutrition Interventions   Nutrition Discussed/Reviewed Nutrition Reviewed, Fluid intake, Decreasing sugar intake  [discussed types of  artificial sweetners]  Pharmacy Interventions   Pharmacy Dicussed/Reviewed Pharmacy Topics Reviewed, Medications and their functions, Medication Adherence  Medication Adherence Not taking medication  [encouraged to take medicines as ordered]                Our next appointment is by telephone on 10/19/23 at 1000  Please call the care guide team at [phone] if you need to cancel or reschedule your appointment.   If you are experiencing a Mental Health or Behavioral Health Crisis or need someone to talk to, please call the Suicide and Crisis Lifeline: 988 call the [place]  National Suicide Prevention Lifeline: 417 363 3921 or TTY: 805 427 2998 TTY 415 857 9430) to talk to a trained counselor call 1-800-273-TALK (toll free, 24 hour hotline) call the [facility]: (220)178-8289 call 911   No computer access, no preference for copy of AVS      The patient has been provided with contact information for the care management team and has been advised to call with any health related questions or concerns.    Juliza Machnik L. Mcarthur Speedy, RN, BSN, [facility]  VBCI Care Management Coordinator  [phone]  Fax: [phone]

## 2023-10-03 NOTE — Patient Instructions (Signed)
 Visit Information  Thank you for taking time to visit with me today. Please don't hesitate to contact me if I can be of assistance to you.   Following are the goals we discussed today:   Goals Addressed             This Visit's Progress    Patient will monitor for elevations in BPs, cbgs, and urine color changes, & contact providers for any changes. I will manage my diet & insulin  to prevent admissions.RN CM)       Patient will outreach to medical staff for worsening symptoms with hypertension, hernia pain 09/26/23 having elevated BP for 4-5 weeks but did not report to any MD-  09/26/23 not having concerns with constipation at this time  10/03/23 continues to have elevations of SBP in mornings and night but reports she is taking her medications. Awaiting pill packaging from [facility]   Care Coordination Interventions: Interventions Today    Flowsheet Row Most Recent Value  Chronic Disease   Chronic disease during today's visit Diabetes, Other, Hypertension (HTN)  General Interventions   General Interventions Discussed/Reviewed General Interventions Reviewed, Doctor Visits, Communication with  Doctor Visits Discussed/Reviewed Doctor Visits Reviewed, PCP  PCP/Specialist Visits Compliance with follow-up visit  Communication with PCP/Specialists  [spoke with pcp in person about patient's hypertension, pill packaging]  Education Interventions   Education Provided Provided Education  Provided Verbal Education On Nutrition, Blood Sugar Monitoring, Medication  Mental Health Interventions   Mental Health Discussed/Reviewed Mental Health Reviewed, Coping Strategies  Nutrition Interventions   Nutrition Discussed/Reviewed Nutrition Reviewed, Fluid intake, Decreasing sugar intake, Decreasing salt  Pharmacy Interventions   Pharmacy Dicussed/Reviewed Pharmacy Topics Reviewed, Medications and their functions, Medication Adherence  Medication Adherence Not taking medication  [question if patient is  taking medications as ordered]                Our next appointment is by telephone on 10/03/23 at 1030  Please call the care guide team at [phone] if you need to cancel or reschedule your appointment.   If you are experiencing a Mental Health or Behavioral Health Crisis or need someone to talk to, please call the Suicide and Crisis Lifeline: 988 call the [place]  National Suicide Prevention Lifeline: 418 441 9507 or TTY: (405)534-9832 TTY 831-114-1023) to talk to a trained counselor call 1-800-273-TALK (toll free, 24 hour hotline) call the [facility]: 315-550-3186 call 911   No computer access, no preference for copy of AVS      The patient has been provided with contact information for the care management team and has been advised to call with any health related questions or concerns.   Emily Jennings L. Ramonita, RN, BSN, [facility]  VBCI Care Management Coordinator  [phone]  Fax: [phone]

## 2023-10-03 NOTE — Patient Instructions (Signed)
 Visit Information  Thank you for taking time to visit with me today. Please don't hesitate to contact me if I can be of assistance to you.   Following are the goals we discussed today:   Goals Addressed             This Visit's Progress    Patient will monitor for elevations in BPs, cbgs, and urine color changes, & contact providers for any changes. I will manage my diet & insulin  to prevent admissions.RN CM)       Patient will outreach to medical staff for worsening symptoms with hypertension, hernia pain  09/26/23 having elevated BP for 4-5 weeks but did not report to any MD-  09/26/23 not having concerns with constipation at this time   Care Coordination Interventions: Interventions Today    Flowsheet Row Most Recent Value  Chronic Disease   Chronic disease during today's visit Diabetes, Other, Hypertension (HTN)  General Interventions   General Interventions Discussed/Reviewed General Interventions Reviewed, Doctor Visits, Communication with  Doctor Visits Discussed/Reviewed Doctor Visits Reviewed, PCP  PCP/Specialist Visits Compliance with follow-up visit  Communication with PCP/Specialists  [spoke with pcp in person about patient's hypertension, pill packaging]  Education Interventions   Education Provided Provided Education  Provided Verbal Education On Nutrition, Blood Sugar Monitoring, Medication  Mental Health Interventions   Mental Health Discussed/Reviewed Mental Health Reviewed, Coping Strategies  Nutrition Interventions   Nutrition Discussed/Reviewed Nutrition Reviewed, Fluid intake, Decreasing sugar intake, Decreasing salt  Pharmacy Interventions   Pharmacy Dicussed/Reviewed Pharmacy Topics Reviewed, Medications and their functions, Medication Adherence  Medication Adherence Not taking medication  [question if patient is taking medications as ordered]                Our next appointment is by telephone on 10/01/23 at 2:30 pm  Please call the care guide  team at [phone] if you need to cancel or reschedule your appointment.   If you are experiencing a Mental Health or Behavioral Health Crisis or need someone to talk to, please call the Suicide and Crisis Lifeline: 988 call the [place]  National Suicide Prevention Lifeline: (808) 746-8022 or TTY: 215 238 4831 TTY (910) 039-4635) to talk to a trained counselor call 1-800-273-TALK (toll free, 24 hour hotline) call the [facility]: 302 075 2298 call 911   Patient verbalizes understanding of instructions and care plan provided today and agrees to view in MyChart. Active MyChart status and patient understanding of how to access instructions and care plan via MyChart confirmed with patient.     The patient has been provided with contact information for the care management team and has been advised to call with any health related questions or concerns.   Nekia Maxham L. Ramonita, RN, BSN, [facility]  VBCI Care Management Coordinator  [phone]  Fax: [phone]

## 2023-10-08 ENCOUNTER — Ambulatory Visit: Payer: Medicare Other | Admitting: Internal Medicine

## 2023-10-08 VITALS — BP 130/68 | HR 78 | Ht 61.0 in | Wt 104.6 lb

## 2023-10-08 DIAGNOSIS — E1142 Type 2 diabetes mellitus with diabetic polyneuropathy: Secondary | ICD-10-CM | POA: Diagnosis not present

## 2023-10-08 DIAGNOSIS — I10 Essential (primary) hypertension: Secondary | ICD-10-CM | POA: Diagnosis not present

## 2023-10-08 DIAGNOSIS — N184 Chronic kidney disease, stage 4 (severe): Secondary | ICD-10-CM | POA: Diagnosis not present

## 2023-10-08 DIAGNOSIS — E1159 Type 2 diabetes mellitus with other circulatory complications: Secondary | ICD-10-CM | POA: Diagnosis not present

## 2023-10-08 DIAGNOSIS — Z794 Long term (current) use of insulin: Secondary | ICD-10-CM

## 2023-10-08 DIAGNOSIS — E1122 Type 2 diabetes mellitus with diabetic chronic kidney disease: Secondary | ICD-10-CM | POA: Diagnosis not present

## 2023-10-08 LAB — GLUCOSE, POCT (MANUAL RESULT ENTRY): POC Glucose: 194 mg/dL — AB (ref 70–99)

## 2023-10-08 LAB — POCT GLYCOSYLATED HEMOGLOBIN (HGB A1C): Hemoglobin A1C: 7 % — AB (ref 4.0–5.6)

## 2023-10-08 NOTE — Patient Instructions (Addendum)
Tresiba 6 units ONCE daily  Humalog 6 units with each meal    HOW TO TREAT LOW BLOOD SUGARS (Blood sugar LESS THAN 70 MG/DL) Please follow the RULE OF 15 for the treatment of hypoglycemia treatment (when your (blood sugars are less than 70 mg/dL)   STEP 1: Take 15 grams of carbohydrates when your blood sugar is low, which includes:  3-4 GLUCOSE TABS  OR 3-4 OZ OF JUICE OR REGULAR SODA OR ONE TUBE OF GLUCOSE GEL    STEP 2: RECHECK blood sugar in 15 MINUTES STEP 3: If your blood sugar is still low at the 15 minute recheck --> then, go back to STEP 1 and treat AGAIN with another 15 grams of carbohydrates.

## 2023-10-08 NOTE — Progress Notes (Signed)
Name: Emily Jennings  MRN/ DOB: [id number], 30-Dec-1933   Age/ Sex: 89 y.o., female    PCP: [name], MD   Reason for Endocrinology Evaluation: Type 2 Diabetes Mellitus     Date of Initial Endocrinology Visit: 12/25/2022    PATIENT IDENTIFIER: Emily Jennings is a 89 y.o. female with a past medical history of DM, anxiety, Hx breast CA. The patient presented for initial endocrinology clinic visit on 12/25/2022 for consultative assistance with her diabetes management.    HPI: Ms. Budlong was    Diagnosed with DM at age 35 Prior Medications tried/Intolerance: Metformin-weight loss            Hemoglobin A1c has ranged from 6.8% in 2022, peaking at 9.2% in 2016.   Transferred care from Dr. Isidoro Donning office   Pt with fear of hypoglycemia  She was started on Farxiga by her nephrologist 2024   Switched Humalog Mix to basal insulin 01/2023 due to recurrent hypoglycemia   Stop Marcelline Deist 04/2023 due to DKA  SUBJECTIVE:   During the last visit (05/01/2023): A1c 7.5%  Today (10/08/23): Ms. Frickey is here for follow-up on diabetes management. She accompanied by her spouse and son.  Checks her blood sugars multiple times daily. The patient has had hypoglycemic episodes since the last clinic visit, pt is symptomatic with these episodes.   Patient is s/p inguinal hernia repair 06/2023 She denies nausea or vomiting   She was diagnosed with cerebellar hematoma/hemorrhage in August 2024   HOME DIABETES REGIMEN: Evaristo Bury 6 units daily Humalog 6 units TIDQAC Spironolactone 50 mg daily     Statin: yes ACE-I/ARB: no    CONTINUOUS GLUCOSE MONITORING RECORD INTERPRETATION: N/A   DIABETIC COMPLICATIONS: Microvascular complications:  CKD IV Denies:  Last eye exam: Completed 2023  Macrovascular complications:  CVA Denies: CAD, PVD   PAST HISTORY: Past Medical History:  Past Medical History:  Diagnosis Date   Allergy    Anxiety    ANXIETY DISORDER, GENERALIZED  07/15/2007   Qualifier: Diagnosis of  By: Chipper Herb     Arthritis    Cancer [facility]) 2009   breast, carcinoma in situ left   Carcinoma in situ of breast 05/21/2008   Qualifier: Diagnosis of  By: [name] MD, Margaret  Diagnosed in 2009, completed 5 year course of tamoxifen, no evidence of recurrence    Carotid stenosis    11/16/2005  mild plaque formation and stenosis proximal right ECA   Cataract    Complication of anesthesia    Coronary artery disease    cardiac catheterization on 03/20/2006  LAD mid 40% stenosis, left circumflex mild 40% stenosis, RCA mid-vessel 40% to 50% lesion   EF 60%   Diabetes mellitus    GERD (gastroesophageal reflux disease)    Hernia, inguinal    left   Hyperglycemia    Hypertension    Insomnia 11/16/2011   Low blood potassium    Non-insulin dependent type 2 diabetes mellitus (HCC)    Osteoporosis    Shortness of breath    2D Echocardiogram 01/26/2009   EF of greater than 55%, mild MR, mild TR, normal ventricular function   Thickened endometrium 10/26/2017   Noted by gyne in 2017, missed 6 month follow up, referred in 09/2017   Ventricular tachycardia, non-sustained (HCC)    developed during stress test 02/08/2006, spontaneously aborted, mild reversible apical defect   Past Surgical History:  Past Surgical History:  Procedure Laterality Date   BOWEL RESECTION Left 07/19/2023  Procedure: SMALL BOWEL RESECTION;  Surgeon: Lucretia Roers, MD;  Location: [facility];  Service: General;  Laterality: Left;   BREAST LUMPECTOMY Left 2009   Left breast 2009   CATARACT EXTRACTION W/PHACO Left 10/28/2014   Procedure: PHACO EMULSION CATARACT EXTRACTION WITH INTRAOCULAR LENS IMPLANT LEFT EYE (IOC);  Surgeon: Chalmers Guest, MD;  Location: [facility] OR;  Service: Ophthalmology;  Laterality: Left;   COLONOSCOPY     cyst removed from left foot     INGUINAL HERNIA REPAIR Left 07/19/2023   Procedure: HERNIA REPAIR FEMORAL INCARCERATED;  Surgeon: Lucretia Roers, MD;   Location: [facility];  Service: General;  Laterality: Left;   REFRACTIVE SURGERY Left     Social History:  reports that she has never smoked. She has never been exposed to tobacco smoke. She has never used smokeless tobacco. She reports that she does not drink alcohol and does not use drugs. Family History:  Family History  Problem Relation Age of Onset   Hypertension Mother    Hyperlipidemia Mother    Stroke Mother    Urticaria Mother    Cancer Father        pancreatic   Colon cancer Father    Heart disease Brother 40       bypass   Heart disease Brother 27       bypass   Arthritis Other    Asthma Other    Diabetes Other    Colon cancer Paternal Aunt    Esophageal cancer Neg Hx    Stomach cancer Neg Hx    Rectal cancer Neg Hx      HOME MEDICATIONS: Allergies as of 10/08/2023       Reactions   Amlodipine Swelling   Benadryl [diphenhydramine Hcl] Hypertension   Citalopram Other (See Comments)   unknown   Farxiga [dapagliflozin] Other (See Comments)   DKA   Metformin And Related Diarrhea   Lost appetite and weight    Tramadol Other (See Comments)   Felt light headed and dizzy   Crestor [rosuvastatin] Other (See Comments)   "Feet swelling", makes her feel weak. Pt takes 5mg  at home        Medication List        Accurate as of October 08, 2023  2:58 PM. If you have any questions, ask your nurse or doctor.          acetaminophen 325 MG tablet Commonly known as: TYLENOL Take 2 tablets (650 mg total) by mouth every 6 (six) hours as needed for mild pain (pain score 1-3) (or Fever >/= 101).   albuterol 108 (90 Base) MCG/ACT inhaler Commonly known as: VENTOLIN HFA Inhale 2 puffs into the lungs every 2 (two) hours as needed for wheezing or shortness of breath.   calcitRIOL 0.25 MCG capsule Commonly known as: ROCALTROL Take 0.25 mcg by mouth 3 (three) times a week.   carvedilol 12.5 MG tablet Commonly known as: COREG Take 12.5 mg by mouth 2 (two) times daily  with a meal.   Farxiga 10 MG Tabs tablet Generic drug: dapagliflozin propanediol Take 10 mg by mouth every morning.   furosemide 40 MG tablet Commonly known as: Lasix Take 1 tablet (40 mg total) by mouth daily.   hydrALAZINE 100 MG tablet Commonly known as: APRESOLINE Take 1 tablet (100 mg total) by mouth 3 (three) times daily. What changed: when to take this   insulin lispro 100 UNIT/ML KwikPen Commonly known as: HumaLOG KwikPen Inject 5 Units into the skin  3 (three) times daily. Max daily 30 units   NIFEdipine 60 MG 24 hr tablet Commonly known as: ADALAT CC Take 1 tablet (60 mg total) by mouth daily.   pantoprazole 40 MG tablet Commonly known as: Protonix Take 1 tablet (40 mg total) by mouth daily.   potassium chloride SA 20 MEQ tablet Commonly known as: KLOR-CON M Take 20 mEq by mouth daily.   rosuvastatin 5 MG tablet Commonly known as: CRESTOR TAKE 1 TABLET (5 MG TOTAL) BY MOUTH DAILY.   spironolactone 50 MG tablet Commonly known as: ALDACTONE Take 1 tablet (50 mg total) by mouth daily.   Evaristo Bury FlexTouch 100 UNIT/ML FlexTouch Pen Generic drug: insulin degludec Inject 5 Units into the skin daily. What changed: how much to take         ALLERGIES: Allergies  Allergen Reactions   Amlodipine Swelling   Benadryl [Diphenhydramine Hcl] Hypertension   Citalopram Other (See Comments)    unknown   Farxiga [Dapagliflozin] Other (See Comments)    DKA   Metformin And Related Diarrhea    Lost appetite and weight    Tramadol Other (See Comments)    Felt light headed and dizzy   Crestor [Rosuvastatin] Other (See Comments)    "Feet swelling", makes her feel weak. Pt takes 5mg  at home     REVIEW OF SYSTEMS: A comprehensive ROS was conducted with the patient and is negative except as per HPI    OBJECTIVE:   VITAL SIGNS: BP 130/68   Pulse 78   Ht 5\' 1"  (1.549 m)   Wt 104 lb 9.6 oz (47.4 kg)   SpO2 98%   BMI 19.76 kg/m    PHYSICAL EXAM:  General: Pt  appears well and is in NAD  Lungs: Clear with good BS bilat   Heart: RRR   Extremities:  Lower extremities - Trace  pretibial edema.   Neuro: MS is good with appropriate affect, pt is alert and Ox3    DM Foot Exam 02/16/2023  The skin of the feet is intact without sores or ulcerations. The pedal pulses are 2+ on right and 2+ on left. The sensation is decreased  to a screening 5.07, 10 gram monofilament bilaterally   DATA REVIEWED:  Lab Results  Component Value Date   HGBA1C 7.0 (A) 10/08/2023   HGBA1C 7.5 (A) 05/01/2023   HGBA1C 6.9 (A) 12/25/2022    ASSESSMENT / PLAN / RECOMMENDATIONS:   1) Type 2 Diabetes Mellitus, Optimally controlled, With neuropathic, CKD IV and macrovascular complications - Most recent A1c of 7.0 %. Goal A1c < 7.5 %.     -Her A1c is optimal without hypoglycemia or severe hyperglycemia - Given advance age , BG goal is 90-200 mg/dL  - She was started on Farxiga through nephrology , we will discontinue due to history of hospitalization for DKA -I attempted to prescribe correction scale for her in the past but she didn't use it  -Pt advised to avoid adjusting basal insulin based on BG readings - She was also advised to avoid skipping prandial insulin if her pre-meal BG is normal     MEDICATIONS:  Continue Tresiba 6 units daily Continue Humalog 6 units with each meal Stop Farxiga  EDUCATION / INSTRUCTIONS: BG monitoring instructions: Patient is instructed to check her blood sugars 3 times a day. Call [facility] if: BG persistently < 70  I reviewed the Rule of 15 for the treatment of hypoglycemia in detail with the patient. Literature supplied.  2) Diabetic complications:  Eye: Does not have known diabetic retinopathy.  Neuro/ Feet: Does  have known diabetic peripheral neuropathy. Renal: Patient does  have known baseline CKD. She is not on an ACEI/ARB at present.  3) Resistant HTN:   -BP optimal  - I increased spironolactone  in 04/2023 due to elevated Aldo/PRA ratio    Follow-up in 4 months    Signed electronically by: Lyndle Herrlich, MD  [facility] Endocrinology  [facility] [address] [place] [place], [place] [address] Phone: [phone] FAX: [phone]   CC: [name], MD [address] [place] [address] Phone: [phone]  Fax: [phone]    Return to Endocrinology clinic as below: Future Appointments  Date Time Provider Department Center  10/18/2023  1:15 PM Lucretia Roers, MD RS-RS None  10/19/2023 10:00 AM Clinton Gallant, RN THN-CCC None  10/24/2023  3:00 PM [name], MD RPC-RPC RPC  11/19/2023 10:00 AM RPC-ANNUAL WELLNESS VISIT RPC-RPC RPC  11/23/2023  1:00 PM Iran Ouch, Lennart Pall, PA-C [facility] [place] [facility]

## 2023-10-15 ENCOUNTER — Telehealth: Payer: Self-pay | Admitting: Family Medicine

## 2023-10-15 NOTE — Telephone Encounter (Signed)
Patient came by the office and needs med refill  hydrALAZINE (APRESOLINE) 100 MG tablet [[id number]]   Pharmacy: [facility]  CALL PATIENT ONCE THIS HAS BEEN SENT TO HER PHARMACY [phone].

## 2023-10-16 ENCOUNTER — Other Ambulatory Visit: Payer: Self-pay

## 2023-10-16 MED ORDER — HYDRALAZINE HCL 100 MG PO TABS: 100.0000 mg | ORAL_TABLET | Freq: Three times a day (TID) | ORAL | 1 refills | Status: DC

## 2023-10-16 NOTE — Telephone Encounter (Signed)
Medication sent.

## 2023-10-18 ENCOUNTER — Ambulatory Visit: Payer: Medicare Other | Admitting: General Surgery

## 2023-10-18 ENCOUNTER — Encounter: Payer: Self-pay | Admitting: General Surgery

## 2023-10-18 VITALS — BP 147/65 | HR 70 | Temp 97.5°F | Resp 12 | Ht 61.0 in | Wt 109.0 lb

## 2023-10-18 DIAGNOSIS — K4130 Unilateral femoral hernia, with obstruction, without gangrene, not specified as recurrent: Secondary | ICD-10-CM

## 2023-10-18 NOTE — Patient Instructions (Signed)
Diet and activity as tolerated. Follow up with Dr. [name] for your blood pressure and blood sugar.  Call with any issues or concerns.

## 2023-10-19 ENCOUNTER — Ambulatory Visit: Payer: Self-pay | Admitting: *Deleted

## 2023-10-19 NOTE — Progress Notes (Signed)
[facility] Note   HPI:  89 y.o. Female presents to clinic for follow-up evaluation of her left femoral hernia. She is doing well and says she is eating and having Bms. No soreness. Patient reports getting back to her activities, denies major issues.  Review of Systems:  Some dizziness at times with BP All other review of systems: otherwise negative   Vital Signs:  BP (!) 147/65   Pulse 70   Temp (!) 97.5 F (36.4 C) (Oral)   Resp 12   Ht 5\' 1"  (1.549 m)   Wt 109 lb (49.4 kg)   SpO2 94%   BMI 20.60 kg/m    Physical Exam:  Physical Exam Cardiovascular:     Rate and Rhythm: Normal rate.  Pulmonary:     Effort: Pulmonary effort is normal.  Abdominal:     General: There is no distension.     Palpations: Abdomen is soft.     Tenderness: There is no abdominal tenderness.     Comments: Left femoral region, incision healed, minor induration remains but similar to right side tissue, no signs of hernia   Neurological:     General: No focal deficit present.     Mental Status: She is alert.  Psychiatric:        Mood and Affect: Mood normal.      Assessment:  89 y.o. yo Female s/p left femoral hernia and SBR. Doing well. Healed.  Plan:  Diet and activity as tolerated. Follow up with Dr. [name] for your blood pressure and blood sugar.  Call with any issues or concerns.   PRN follow up.   Algis Greenhouse, MD [facility] [address] [place], [place] [address] [phone] (office)

## 2023-10-19 NOTE — Patient Outreach (Signed)
  Care Coordination   10/19/2023 Name: DEMIAH GULLICKSON MRN: [id number] DOB: [birth year]   Care Coordination Outreach Attempts:  An unsuccessful outreach was attempted for an appointment today.  Follow Up Plan:  Additional outreach attempts will be made to offer the patient complex care management information and services.   Encounter Outcome:  No Answer   Care Coordination Interventions:  No, not indicated    Khyra Viscuso L. Noelle Penner, RN, BSN, CCM [place]  [facility], [facility] RN Care Manager Direct Dial: [phone]  Fax: [phone] Mailing Address: [address]  [place] [address] Website: [email or web address]

## 2023-10-19 NOTE — Patient Outreach (Signed)
  Care Coordination   10/19/2023 Name: Emily Jennings MRN: [id number] DOB: [birth year]   Care Coordination Outreach Attempts:  An unsuccessful outreach was attempted for an appointment today.  Follow Up Plan:  Additional outreach attempts will be made to offer the patient complex care management information and services.   Encounter Outcome:  No Answer   Care Coordination Interventions:  No, not indicated    Khyra Viscuso L. Noelle Penner, RN, BSN, CCM [place]  [facility] RN Care Manager Direct Dial: [phone]  Fax: [phone] Mailing Address: [address]  [place] [address] Website: [email or web address]

## 2023-10-19 NOTE — Patient Outreach (Signed)
  Care Coordination   Follow Up Visit Note   12/07/2023 updated note for 10/19/23 Name: Emily Jennings MRN: [id number] DOB: 1934-06-21  Emily Jennings is a 89 y.o. year old female who sees Emily Jennings, Emily Mallick, MD for primary care. I spoke with  Emily Jennings by phone today.  What matters to the patients health and wellness today?  Frequent missed calls, speaking fast, no pill packs yet  Cbg 184 this morning   Need eye md  Dr Emily Jennings does not complete diabetic eye exams   Goals Addressed             This Visit's Progress    Patient will monitor for elevations in BPs, cbgs, and urine color changes, & contact providers for any changes. I will manage my diet & insulin to prevent admissions.RN CM)       Patient will outreach to medical staff for worsening symptoms with hypertension, hernia pain   Care Coordination Interventions: Interventions Today    Flowsheet Row Most Recent Value  Chronic Disease   Chronic disease during today's visit Diabetes, Other  [eye exam]  General Interventions   General Interventions Discussed/Reviewed General Interventions Reviewed, [facility], Doctor Visits  Doctor Visits Discussed/Reviewed Doctor Visits Reviewed, Specialist  PCP/Specialist Visits Compliance with follow-up visit                SDOH assessments and interventions completed:  No     Care Coordination Interventions:  Yes, provided   Follow up plan: Follow up call scheduled for 10/24/23    Encounter Outcome:  Patient Visit Completed   Emily Bradford L. Noelle Penner, RN, BSN, CCM [place]  [facility], [facility] RN Care Manager Direct Dial: [phone]  Fax: [phone] Mailing Address: [address]  [place] [address] Website: [email or web address]

## 2023-10-24 ENCOUNTER — Ambulatory Visit (INDEPENDENT_AMBULATORY_CARE_PROVIDER_SITE_OTHER): Payer: Medicare Other | Admitting: Family Medicine

## 2023-10-24 ENCOUNTER — Ambulatory Visit: Payer: Self-pay | Admitting: *Deleted

## 2023-10-24 ENCOUNTER — Encounter: Payer: Self-pay | Admitting: Family Medicine

## 2023-10-24 VITALS — BP 138/62 | HR 80 | Ht 61.0 in | Wt 108.1 lb

## 2023-10-24 DIAGNOSIS — E785 Hyperlipidemia, unspecified: Secondary | ICD-10-CM | POA: Diagnosis not present

## 2023-10-24 DIAGNOSIS — Z794 Long term (current) use of insulin: Secondary | ICD-10-CM

## 2023-10-24 DIAGNOSIS — Z1231 Encounter for screening mammogram for malignant neoplasm of breast: Secondary | ICD-10-CM | POA: Diagnosis not present

## 2023-10-24 DIAGNOSIS — E1122 Type 2 diabetes mellitus with diabetic chronic kidney disease: Secondary | ICD-10-CM | POA: Diagnosis not present

## 2023-10-24 DIAGNOSIS — Z0001 Encounter for general adult medical examination with abnormal findings: Secondary | ICD-10-CM

## 2023-10-24 DIAGNOSIS — N1831 Chronic kidney disease, stage 3a: Secondary | ICD-10-CM | POA: Diagnosis not present

## 2023-10-24 DIAGNOSIS — I10 Essential (primary) hypertension: Secondary | ICD-10-CM | POA: Diagnosis not present

## 2023-10-24 NOTE — Progress Notes (Signed)
 Emily Jennings     MRN: [id number]      DOB: 05-08-1934  Chief Complaint  Patient presents with   Annual Exam    CPE dizzy today    HPI: Patient is in for annual physical exam. Reports feeling slightly light headed, still not getting medication pill packed Immunization is reviewed , and  updated if needed.   PE: BP 138/62   Pulse 80   Ht 5' 1 (1.549 m)   Wt 108 lb 1.9 oz (49 kg)   SpO2 94%   BMI 20.43 kg/m   Pleasant  female, alert and oriented x 3, in no cardio-pulmonary distress. Afebrile. HEENT No facial trauma or asymetry. Sinuses non tender.  Extra occullar muscles intact.. External ears normal, . Neck: supple, no adenopathy,JVD or thyromegaly.No bruits.  Chest: Clear to ascultation bilaterally.No crackles or wheezes. Non tender to palpation  Breast: Asymptomatic, not examined  Cardiovascular system; Heart sounds normal,  S1 and  S2 ,no S3.  No murmur, or thrill. Apical beat not displaced Peripheral pulses normal.  Abdomen: Soft, non tender, Bowel sounds normal. No guarding, tenderness or rebound.    Musculoskeletal exam: Decreased  ROM of spine, hips , shoulders and knees. No deformity ,swelling or crepitus noted. No muscle wasting or atrophy.   Neurologic: Cranial nerves 2 to 12 intact. Power, tone ,sensation  normal throughout. No disturbance in gait. No tremor.  Skin: Intact, no ulceration, erythema , scaling or rash noted. Pigmentation normal throughout  Psych; Normal mood and affect. Judgement and concentration normal   Assessment & Plan:  Type 2 diabetes mellitus with stage 3a chronic kidney disease, with long-term current use of insulin  (HCC) Improved, managed by endo Diabetes associated with hypertension, hyperlipidemia, and CKD  Emily Jennings is reminded of the importance of commitment to daily physical activity for 30 minutes or more, as able and the need to limit carbohydrate intake to 30 to 60 grams per meal to help with blood  sugar control.   The need to take medication as prescribed, test blood sugar as directed, and to call between visits if there is a concern that blood sugar is uncontrolled is also discussed.   Emily Jennings is reminded of the importance of daily foot exam, annual eye examination, and good blood sugar, blood pressure and cholesterol control.     Latest Ref Rng & Units 10/08/2023    2:48 PM 07/27/2023    9:15 AM 07/25/2023    4:21 AM 07/23/2023    4:48 AM 07/21/2023    5:05 AM  Diabetic Labs  HbA1c 4.0 - 5.6 % 7.0       Creatinine 0.57 - 1.00 mg/dL  7.60  7.41  7.17  6.04       10/24/2023    3:38 PM 10/24/2023    3:06 PM 10/24/2023    3:05 PM 10/18/2023    1:16 PM 10/08/2023    2:36 PM 09/06/2023    1:38 PM 09/05/2023    3:37 PM  BP/Weight  Systolic BP 138 152 151 147 130 187 100  Diastolic BP 62 67 62 65 68 70 50  Wt. (Lbs)   108.12 109 104.6 109   BMI   20.43 kg/m2 20.6 kg/m2 19.76 kg/m2 20.6 kg/m2       Latest Ref Rng & Units 11/10/2020    1:18 PM 11/10/2020   12:00 AM  Foot/eye exam completion dates  Eye Exam No Retinopathy No Retinopathy     No Retinopathy  This result is from an external source.        Encounter for Medicare annual examination with abnormal findings Annual exam as documented. Immunization and cancer screening needs are specifically addressed at this visit.

## 2023-10-24 NOTE — Patient Outreach (Signed)
  Care Coordination   In Person Provider Office Visit Note   10/24/2023 Name: Emily Jennings MRN: [id number] DOB: 12-14-1933  Emily Jennings is a 89 y.o. year old female who sees Emily Rollene BRAVO, MD for primary care. I spoke with  Emily Jennings and [name] by phone today.  What matters to the patients health and wellness today?  Review of diabetic nutrition with review of various diets, diabetic neuropathy education  Replaced items missing in mail     Goals Addressed             This Visit's Progress    Patient will monitor for elevations in BPs, cbgs, and urine color changes, & contact providers for any changes. I will manage my diet & insulin  to prevent admissions.RN CM)   On track    Patient will outreach to medical staff for worsening symptoms with hypertension, hernia pain  09/26/23 having elevated BP for 4-5 weeks but did not report to any MD-  09/26/23 not having concerns with constipation at this time   Care Coordination Interventions: Interventions Today    Flowsheet Row Most Recent Value  Chronic Disease   Chronic disease during today's visit Diabetes, Chronic Kidney Disease/End Stage Renal Disease (ESRD), Hypertension (HTN), Other  [nutritional plans]  General Interventions   General Interventions Discussed/Reviewed General Interventions Reviewed, [facility], Doctor Visits, Communication with  Doctor Visits Discussed/Reviewed Doctor Visits Reviewed, PCP  [in person discussion with pcp on pill packaging assistance]  PCP/Specialist Visits Compliance with follow-up visit  Communication with PCP/Specialists  Exercise Interventions   Exercise Discussed/Reviewed Exercise Reviewed, Physical Activity  [active with assistive device]  Physical Activity Discussed/Reviewed Physical Activity Reviewed  Education Interventions   Education Provided Provided Education, Provided Printed Education  Whole Foods plans for CKD, DM, mediterranean, low sodium, Best foods & drinks for  diabeitcs in detail with her & spouse Information on diabetic neuropathy]  Provided Verbal Education On Nutrition, Medication, When to see the doctor  Mental Health Interventions   Mental Health Discussed/Reviewed Mental Health Reviewed, Coping Strategies  Nutrition Interventions   Nutrition Discussed/Reviewed Nutrition Reviewed, Carbohydrate meal planning, Adding fruits and vegetables, Decreasing fats, Decreasing sugar intake, Decreasing salt, Portion sizes  Pharmacy Interventions   Pharmacy Dicussed/Reviewed Pharmacy Topics Reviewed, Affording Medications  Safety Interventions   Safety Discussed/Reviewed Safety Reviewed, Home Safety, Fall Risk  Home Safety Assistive Devices  Advanced Directive Interventions   Advanced Directives Discussed/Reviewed Advanced Directives Discussed                SDOH assessments and interventions completed:  No     Care Coordination Interventions:  Yes, provided   Follow up plan: Follow up call scheduled for 11/28/23    Encounter Outcome:  Patient Visit Completed   Suzen L. Ramonita, RN, BSN, CCM [place]  [facility], [facility] RN Care Manager Direct Dial : [phone]  Fax: [phone] Mailing Address: [address]  [place] [address] Website: [email or web address]

## 2023-10-24 NOTE — Patient Instructions (Addendum)
 F/U in 4 months, call if you need me sooner  Please schedule mammogram at checkout  Lipid panel, hepatic panel, today ( unless she ate high fat meal in past 4 hours which I doubt)  You are referred for eye exam to Provider of your choice, nurse please give contact information for Dr Ruthellen   ([place] in [place])  Please arrange/ coordinate  pill packing before checkout if possible  You are improved ,  keep this up!  Thanks for choosing [facility], we consider it a privelige to serve you.

## 2023-10-24 NOTE — Assessment & Plan Note (Signed)
 Improved, managed by endo Diabetes associated with hypertension, hyperlipidemia, and CKD  Emily Jennings is reminded of the importance of commitment to daily physical activity for 30 minutes or more, as able and the need to limit carbohydrate intake to 30 to 60 grams per meal to help with blood sugar control.   The need to take medication as prescribed, test blood sugar as directed, and to call between visits if there is a concern that blood sugar is uncontrolled is also discussed.   Emily Jennings is reminded of the importance of daily foot exam, annual eye examination, and good blood sugar, blood pressure and cholesterol control.     Latest Ref Rng & Units 10/08/2023    2:48 PM 07/27/2023    9:15 AM 07/25/2023    4:21 AM 07/23/2023    4:48 AM 07/21/2023    5:05 AM  Diabetic Labs  HbA1c 4.0 - 5.6 % 7.0       Creatinine 0.57 - 1.00 mg/dL  7.60  7.41  7.17  6.04       10/24/2023    3:38 PM 10/24/2023    3:06 PM 10/24/2023    3:05 PM 10/18/2023    1:16 PM 10/08/2023    2:36 PM 09/06/2023    1:38 PM 09/05/2023    3:37 PM  BP/Weight  Systolic BP 138 152 151 147 130 187 100  Diastolic BP 62 67 62 65 68 70 50  Wt. (Lbs)   108.12 109 104.6 109   BMI   20.43 kg/m2 20.6 kg/m2 19.76 kg/m2 20.6 kg/m2       Latest Ref Rng & Units 11/10/2020    1:18 PM 11/10/2020   12:00 AM  Foot/eye exam completion dates  Eye Exam No Retinopathy No Retinopathy     No Retinopathy         This result is from an external source.

## 2023-10-24 NOTE — Patient Instructions (Addendum)
 Visit Information  Thank you for taking time to visit with me today. Please don't hesitate to contact me if I can be of assistance to you.   Following are the goals we discussed today:   Goals Addressed             This Visit's Progress    Patient will monitor for elevations in BPs, cbgs, and urine color changes, & contact providers for any changes. I will manage my diet & insulin  to prevent admissions.RN CM)   On track    Patient will outreach to medical staff for worsening symptoms with hypertension, hernia pain  09/26/23 having elevated BP for 4-5 weeks but did not report to any MD-  09/26/23 not having concerns with constipation at this time   Care Coordination Interventions: Interventions Today    Flowsheet Row Most Recent Value  Chronic Disease   Chronic disease during today's visit Diabetes, Chronic Kidney Disease/End Stage Renal Disease (ESRD), Hypertension (HTN), Other  [nutritional plans]  General Interventions   General Interventions Discussed/Reviewed General Interventions Reviewed, [facility], Doctor Visits, Communication with  Doctor Visits Discussed/Reviewed Doctor Visits Reviewed, PCP  [in person discussion with pcp on pill packaging assistance]  PCP/Specialist Visits Compliance with follow-up visit  Communication with PCP/Specialists  Exercise Interventions   Exercise Discussed/Reviewed Exercise Reviewed, Physical Activity  [active with assistive device]  Physical Activity Discussed/Reviewed Physical Activity Reviewed  Education Interventions   Education Provided Provided Education, Provided Printed Education  Whole Foods plans for CKD, DM, mediterranean, low sodium, Best foods & drinks for diabeitcs in detail with her & spouse Information on diabetic neuropathy]  Provided Verbal Education On Nutrition, Medication, When to see the doctor  Mental Health Interventions   Mental Health Discussed/Reviewed Mental Health Reviewed, Coping Strategies  Nutrition  Interventions   Nutrition Discussed/Reviewed Nutrition Reviewed, Carbohydrate meal planning, Adding fruits and vegetables, Decreasing fats, Decreasing sugar intake, Decreasing salt, Portion sizes  Pharmacy Interventions   Pharmacy Dicussed/Reviewed Pharmacy Topics Reviewed, Affording Medications  Safety Interventions   Safety Discussed/Reviewed Safety Reviewed, Home Safety, Fall Risk  Home Safety Assistive Devices  Advanced Directive Interventions   Advanced Directives Discussed/Reviewed Advanced Directives Discussed                Our next appointment is by telephone on 11/28/23 at 3:15 pm  Please call the care guide team at [phone] if you need to cancel or reschedule your appointment.   If you are experiencing a Mental Health or Behavioral Health Crisis or need someone to talk to, please call the Suicide and Crisis Lifeline: 988 call the [place]  National Suicide Prevention Lifeline: 501 354 1863 or TTY: 605-139-7629 TTY 226-435-0804) to talk to a trained counselor call 1-800-273-TALK (toll free, 24 hour hotline) call the [facility]: [phone] call 911   Patient verbalizes understanding of instructions and care plan provided today and agrees to view in MyChart. Active MyChart status and patient understanding of how to access instructions and care plan via MyChart confirmed with patient.     The patient has been provided with contact information for the care management team and has been advised to call with any health related questions or concerns.   Shiva Sahagian L. Ramonita, RN, BSN, CCM [place]  [facility], [facility] RN Care Manager Direct Dial : [phone]  Fax: [phone] Mailing Address: [address]  [place] [address] Website: [email or web address]

## 2023-10-25 LAB — LIPID PANEL
Chol/HDL Ratio: 1.9 {ratio} (ref 0.0–4.4)
Cholesterol, Total: 206 mg/dL — ABNORMAL HIGH (ref 100–199)
HDL: 110 mg/dL (ref >39)
LDL Chol Calc (NIH): 78 mg/dL (ref 0–99)
Triglycerides: 105 mg/dL (ref 0–149)
VLDL Cholesterol Cal: 18 mg/dL (ref 5–40)

## 2023-10-25 LAB — HEPATIC FUNCTION PANEL
ALT: 13 [IU]/L (ref 0–32)
AST: 31 [IU]/L (ref 0–40)
Albumin: 4 g/dL (ref 3.7–4.7)
Alkaline Phosphatase: 81 [IU]/L (ref 44–121)
Bilirubin Total: 0.3 mg/dL (ref 0.0–1.2)
Bilirubin, Direct: 0.1 mg/dL (ref 0.00–0.40)
Total Protein: 6.3 g/dL (ref 6.0–8.5)

## 2023-10-29 NOTE — Assessment & Plan Note (Signed)
 Annual exam as documented. . Immunization and cancer screening needs are specifically addressed at this visit.

## 2023-11-01 LAB — HM DIABETES EYE EXAM

## 2023-11-02 DIAGNOSIS — D631 Anemia in chronic kidney disease: Secondary | ICD-10-CM | POA: Diagnosis not present

## 2023-11-02 DIAGNOSIS — N189 Chronic kidney disease, unspecified: Secondary | ICD-10-CM | POA: Diagnosis not present

## 2023-11-02 DIAGNOSIS — R809 Proteinuria, unspecified: Secondary | ICD-10-CM | POA: Diagnosis not present

## 2023-11-02 DIAGNOSIS — E211 Secondary hyperparathyroidism, not elsewhere classified: Secondary | ICD-10-CM | POA: Diagnosis not present

## 2023-11-02 DIAGNOSIS — I129 Hypertensive chronic kidney disease with stage 1 through stage 4 chronic kidney disease, or unspecified chronic kidney disease: Secondary | ICD-10-CM | POA: Diagnosis not present

## 2023-11-06 ENCOUNTER — Other Ambulatory Visit: Payer: Self-pay | Admitting: Family Medicine

## 2023-11-07 DIAGNOSIS — I5032 Chronic diastolic (congestive) heart failure: Secondary | ICD-10-CM | POA: Diagnosis not present

## 2023-11-07 DIAGNOSIS — N184 Chronic kidney disease, stage 4 (severe): Secondary | ICD-10-CM | POA: Diagnosis not present

## 2023-11-07 DIAGNOSIS — E1122 Type 2 diabetes mellitus with diabetic chronic kidney disease: Secondary | ICD-10-CM | POA: Diagnosis not present

## 2023-11-07 DIAGNOSIS — R808 Other proteinuria: Secondary | ICD-10-CM | POA: Diagnosis not present

## 2023-11-19 ENCOUNTER — Ambulatory Visit: Payer: Medicare Other

## 2023-11-23 ENCOUNTER — Encounter: Payer: Self-pay | Admitting: Student

## 2023-11-23 ENCOUNTER — Ambulatory Visit: Payer: Medicare Other | Attending: Student | Admitting: Student

## 2023-11-23 VITALS — BP 120/52 | HR 63 | Ht 60.0 in | Wt 112.4 lb

## 2023-11-23 DIAGNOSIS — N184 Chronic kidney disease, stage 4 (severe): Secondary | ICD-10-CM | POA: Diagnosis not present

## 2023-11-23 DIAGNOSIS — I10 Essential (primary) hypertension: Secondary | ICD-10-CM | POA: Diagnosis not present

## 2023-11-23 DIAGNOSIS — E785 Hyperlipidemia, unspecified: Secondary | ICD-10-CM | POA: Diagnosis not present

## 2023-11-23 DIAGNOSIS — I38 Endocarditis, valve unspecified: Secondary | ICD-10-CM | POA: Diagnosis not present

## 2023-11-23 DIAGNOSIS — I5032 Chronic diastolic (congestive) heart failure: Secondary | ICD-10-CM | POA: Diagnosis not present

## 2023-11-23 NOTE — Patient Instructions (Signed)
 Medication Instructions:  Your physician recommends that you continue on your current medications as directed. Please refer to the Current Medication list given to you today.  *If you need a refill on your cardiac medications before your next appointment, please call your pharmacy*   Lab Work: NONE   If you have labs (blood work) drawn today and your tests are completely normal, you will receive your results only by: MyChart Message (if you have MyChart) OR A paper copy in the mail If you have any lab test that is abnormal or we need to change your treatment, we will call you to review the results.   Testing/Procedures: NONE    Follow-Up: At [facility], you and your health needs are our priority.  As part of our continuing mission to provide you with exceptional heart care, we have created designated Provider Care Teams.  These Care Teams include your primary Cardiologist (physician) and Advanced Practice Providers (APPs -  Physician Assistants and Nurse Practitioners) who all work together to provide you with the care you need, when you need it.  We recommend signing up for the patient portal called "MyChart".  Sign up information is provided on this After Visit Summary.  MyChart is used to connect with patients for Virtual Visits (Telemedicine).  Patients are able to view lab/test results, encounter notes, upcoming appointments, etc.  Non-urgent messages can be sent to your provider as well.   To learn more about what you can do with MyChart, go to [email or web address].    Your next appointment:   4 -5 month(s)  Provider:   You may see Dina Rich, MD or one of the following Advanced Practice Providers on your designated Care Team:   Randall An, PA-C  Jacolyn Reedy, [place]     Other Instructions Thank you for choosing [facility]!

## 2023-11-23 NOTE — Progress Notes (Signed)
 Cardiology Office Note    Date:  11/23/2023  ID:  Emily NEAULT, DOB Mar 12, 1934, MRN [id number] Cardiologist: Dina Rich, MD    History of Present Illness:    Emily Jennings is a 89 y.o. female with past medical history of chronic HFpEF, valvular heart disease, HTN, HLD, Type II DM, Stage 3-4 CKD and GERD who presents to the office today for overdue follow-up.  She was last examined by myself in 01/2023 and reported having episodes of hypoglycemia and did have follow-up with Endocrinology in regards to this. Also had lower extremity edema and Lasix had recently been increased from 40 mg daily to 40 mg twice daily. It was felt that her edema could be due to the higher dose of Amlodipine and this was reduced from 10 mg daily to 5 mg daily. Prior echocardiogram in 08/2022 had shown mild AS and was recommend to obtain a follow-up echocardiogram at the time of her next visit.  She did have a repeat echocardiogram during her admission in 04/2023 and this showed a preserved EF of 65 to 70% with no regional wall motion abnormalities.he did have moderate LVH, grade 2 diastolic dysfunction, normal RV function, mildly elevated PASP, a small pericardial effusion and chordal systolic anterior motion of the mitral valve with mild MR and mild to moderate AS. Was hospitalized for a right cerebellar intraparenchymal hemorrhage at that time which was felt to be due to elevated BP. Hospitalized again later that month for an acute CHF exacerbation. She had a recurrent admission in 06/2023 for acute hypoxic respiratory failure in the setting of a CHF exacerbation and aspiration pneumonia. Was again admitted in 07/2023 for incarcerated left inguinal hernia and required NG placement and underwent surgical repair. Also had an AKI during admission with creatinine peaking at 4.56 and returned to baseline at the time of discharge.  In talking with the patient and her husband today, they report things have overall been stable  for the past few months.  She is mostly at home but does ambulate with a cane. Denies any specific dyspnea on exertion, orthopnea or PND.  Does experience intermittent lower extremity edema. Says she takes her Lasix at night as she feels like she would be dehydrated if she took this in the morning prior to consuming water. Denies any specific chest pain or palpitations. She does feel off balance at times but reports has been occurring since her prior stroke.  Studies Reviewed:   EKG: EKG is not ordered today.  Echocardiogram: 04/2023 IMPRESSIONS     1. Left ventricular ejection fraction, by estimation, is 65 to 70%. The  left ventricle has normal function. The left ventricle has no regional  wall motion abnormalities. There is moderate concentric left ventricular  hypertrophy. Left ventricular  diastolic parameters are consistent with Grade II diastolic dysfunction  (pseudonormalization).   2. Right ventricular systolic function is normal. The right ventricular  size is normal. There is mildly elevated pulmonary artery systolic  pressure. The estimated right ventricular systolic pressure is 38.3 mmHg.   3. Left atrial size was severely dilated.   4. Right atrial size was mildly dilated.   5. A small pericardial effusion is present. The pericardial effusion is  anterior to the right ventricle.   6. Chordal systolic anterior motion of the mitral valve. . The mitral  valve is grossly normal. Mild mitral valve regurgitation. No evidence of  mitral stenosis.   7. The aortic valve is abnormal. There is moderate calcification  of the  aortic valve. Aortic valve regurgitation is trivial. Mild to moderate  aortic valve stenosis. Aortic valve area, by VTI measures 1.33 cm. Aortic  valve mean gradient measures 18.5  mmHg. Aortic valve Vmax measures 2.88 m/s.   8. The inferior vena cava is normal in size with greater than 50%  respiratory variability, suggesting right atrial pressure of 3 mmHg.     Physical Exam:   VS:  BP (!) 120/52   Pulse 63   Ht 5' (1.524 m)   Wt 112 lb 6.4 oz (51 kg)   SpO2 99%   BMI 21.95 kg/m    Wt Readings from Last 3 Encounters:  11/23/23 112 lb 6.4 oz (51 kg)  10/24/23 108 lb 1.9 oz (49 kg)  10/18/23 109 lb (49.4 kg)     GEN: Pleasant, elderly female appearing in no acute distress NECK: No JVD; No carotid bruits CARDIAC: RRR, 2/6 SEM along RUSB.  RESPIRATORY:  Clear to auscultation without rales, wheezing or rhonchi  ABDOMEN: Appears non-distended. No obvious abdominal masses. EXTREMITIES: No clubbing or cyanosis. No pitting edema.  Distal pedal pulses are 2+ bilaterally.   Assessment and Plan:   1. Chronic HFpEF - Echocardiogram in 04/2023 showed preserved EF of 65 to 70% with grade 2 diastolic dysfunction and normal RV function. Her weight has gradually increased at home over the past few months but this has been in the setting of improvement in her appetite. No worsening respiratory issues. She does experience occasional lower extremity edema and we reviewed the importance of elevating her lower extremities when able. Continue current diuretic therapy with Lasix 40 mg daily. Would not use an SGLT2 inhibitor at this time given her variable renal function.  2. 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 Was noted on echo in 04/2023 as well with only mild MR. She did have mild to moderate aortic stenosis. Would anticipate repeat imaging in 1 to 2 years for reassessment but doubt she would be a candidate for TAVR given inability to undergo catheterization and pre-procedure CT's due to her CKD.  3. HTN - BP is well-controlled at 120/52 during today's visit. They report this was previously elevated at home but has improved recently. Unable to recall specific values. Continue current medical therapy for now with  Coreg 12.5 mg twice daily, Hydralazine 100 mg 3 times daily, Nifedipine 60 mg daily and Lasix 40 mg daily. Will defer diuretic to Nephrology given her CKD.  4. HLD - Followed by her PCP.  FLP in 10/2023 showed her LDL was at 78. Continue current medical therapy with Crestor 5 mg daily.  5. Stage 4 CKD - Creatinine was at 2.39 when checked in 07/2023. Followed by Dr. Wolfgang Phoenix and recent labs on 11/02/2023 showed her creatinine had improved to 2.28.  Signed, Ellsworth Lennox, PA-C

## 2023-11-28 ENCOUNTER — Ambulatory Visit: Payer: Self-pay | Admitting: *Deleted

## 2023-11-28 DIAGNOSIS — E782 Mixed hyperlipidemia: Secondary | ICD-10-CM | POA: Diagnosis not present

## 2023-11-28 DIAGNOSIS — I10 Essential (primary) hypertension: Secondary | ICD-10-CM | POA: Diagnosis not present

## 2023-11-28 NOTE — Patient Outreach (Unsigned)
{  CHL AMB AG OT NOTE LIST:[id number]} 

## 2023-11-29 LAB — COMPREHENSIVE METABOLIC PANEL
ALT: 12 IU/L (ref 0–32)
AST: 28 IU/L (ref 0–40)
Albumin: 4.2 g/dL (ref 3.7–4.7)
Alkaline Phosphatase: 88 IU/L (ref 44–121)
BUN/Creatinine Ratio: 26 (ref 12–28)
BUN: 61 mg/dL — ABNORMAL HIGH (ref 8–27)
Bilirubin Total: 0.4 mg/dL (ref 0.0–1.2)
CO2: 30 mmol/L — ABNORMAL HIGH (ref 20–29)
Calcium: 9.2 mg/dL (ref 8.7–10.3)
Chloride: 88 mmol/L — ABNORMAL LOW (ref 96–106)
Creatinine, Ser: 2.31 mg/dL — ABNORMAL HIGH (ref 0.57–1.00)
Globulin, Total: 1.8 g/dL (ref 1.5–4.5)
Glucose: 141 mg/dL — ABNORMAL HIGH (ref 70–99)
Potassium: 3 mmol/L — ABNORMAL LOW (ref 3.5–5.2)
Sodium: 134 mmol/L (ref 134–144)
Total Protein: 6 g/dL (ref 6.0–8.5)
eGFR: 20 mL/min/{1.73_m2} — ABNORMAL LOW (ref >59)

## 2023-11-29 LAB — LIPID PANEL
Chol/HDL Ratio: 1.8 ratio (ref 0.0–4.4)
Cholesterol, Total: 172 mg/dL (ref 100–199)
HDL: 93 mg/dL (ref >39)
LDL Chol Calc (NIH): 59 mg/dL (ref 0–99)
Triglycerides: 121 mg/dL (ref 0–149)
VLDL Cholesterol Cal: 20 mg/dL (ref 5–40)

## 2023-11-29 LAB — TSH: TSH: 2.55 u[IU]/mL (ref 0.450–4.500)

## 2023-11-29 LAB — T4, FREE: Free T4: 1.44 ng/dL (ref 0.82–1.77)

## 2023-11-29 NOTE — Patient Instructions (Signed)
 Visit Information  Thank you for taking time to visit with me today. Please don't hesitate to contact me if I can be of assistance to you.   Following are the goals we discussed today:   Goals Addressed             This Visit's Progress    Patient will monitor for elevations in BPs, cbgs, and urine color changes, & contact providers for any changes. I will manage my diet & insulin to prevent admissions.RN CM)   Not on track    Patient will outreach to medical staff for worsening symptoms with hypertension, hernia pain  11/28/23 Patient again reports elevated cbg & low BPs in the mornings after nights of frequent voiding at night   Care Coordination Interventions: Interventions Today    Flowsheet Row Most Recent Value  Chronic Disease   Chronic disease during today's visit Diabetes, Hypertension (HTN), Other  General Interventions   General Interventions Discussed/Reviewed General Interventions Reviewed, Doctor Visits, Communication with, Sick Day Rules  Doctor Visits Discussed/Reviewed Doctor Visits Reviewed, PCP, Specialist  PCP/Specialist Visits Compliance with follow-up visit  Communication with PCP/Specialists  Education Interventions   Education Provided Provided Education  [the correlation of elevated cbg, low BP in the morning related to her increase voiding during the night]  Provided Verbal Education On --  [Explained to patient why dehydration causes low BP and high CBG. Encouraged to take Lasix 40 mg earlier in the day to see if that helps.]  Mental Health Interventions   Mental Health Discussed/Reviewed Mental Health Reviewed, Coping Strategies  Nutrition Interventions   Nutrition Discussed/Reviewed Nutrition Reviewed, Fluid intake  Pharmacy Interventions   Pharmacy Dicussed/Reviewed --  [reviewed pill packaging again Confirms she agrees to pill packing (via [facility] or [employer]) for medication adherence]                Our next appointment is by  telephone on 12/06/22 at 2 Pm  Please call the care guide team at [phone] if you need to cancel or reschedule your appointment.   If you are experiencing a Mental Health or Behavioral Health Crisis or need someone to talk to, please call the Suicide and Crisis Lifeline: 988 call the [place] National Suicide Prevention Lifeline: 432-799-3205 or TTY: 360-012-6818 TTY (206) 364-4429) to talk to a trained counselor call 1-800-273-TALK (toll free, 24 hour hotline) call the [facility]: [phone] call 911   Patient verbalizes understanding of instructions and care plan provided today and agrees to view in MyChart. Active MyChart status and patient understanding of how to access instructions and care plan via MyChart confirmed with patient.     The patient has been provided with contact information for the care management team and has been advised to call with any health related questions or concerns.   Katye Valek L. Noelle Penner, RN, BSN, CCM [place]  [facility], [facility] RN Care Manager Direct Dial: [phone]  Fax: [phone]

## 2023-11-29 NOTE — Patient Outreach (Addendum)
 Care Coordination   Follow Up Visit Note   11/29/2023 Name: Emily Jennings MRN: [id number] DOB: 15-Jul-1934  Emily Jennings is a 89 y.o. year old female who sees [name], MD for primary care. I spoke with  [name] by phone today.  What matters to the patients health and wellness today?  "Feel bad today" "need refill of kidney medicine I take Mondays, Wednesdays and Fridays" She reports this is related to voiding a lot at night + BP goes up and CBG goes down and vice reversal.   Chronic Kidney disease (CKD)/hyptension/hyperglycemia She reports she takes her Lasix 40 mg daily dose in the evenings, generally goes to bed around midnight & then urinates up until 4:00 AM. This causes poor sleep. Confirms dehydration in the mornings and a dry mouth as she snores with her mouth open, Voiced understanding that as a mouth breather she may not be getting enough oxygen that leads to fatigue. She confirms she has noted some low oxygen saturations in the 70's. She states she will attempt to take her Lasix earlier, mid day to decrease the night time voiding to help with sleep and feeling better in the mornings, She agrees for RN CM to check with her in a week to see if this change has helped She voiced understanding that dehydration causes a low BP and can be related to her increase cbgs  she agrees to pill packing (via [facility] or [facility]) for medication adherence   Medications Need refill on Calcitriol from Dr. Wolfgang Phoenix, Nephrology; took last pill today, 11/28/23. Gave permission for RN CM to call this in to Dr Wolfgang Phoenix office & for RN CM to route this note to Dr Wolfgang Phoenix to get advise about Lasix dosage, time of administration to help with increase night voiding, dehydration leading to hypotension ? Elevated cbgs   Diabetes/hypertension CBG 150 this morning, sometimes up to 200, no morning BP reading today.  See Dr. Fransico Him, endocrinologist, on Wednesday 12/05/23 at 2:30 pm. Gave permission  for RN CM to route this note to  Dr Fransico Him for her concern with elevations in cbgs in the mornings & to get advise if Lasix may be causing this to occur    Goals Addressed             This Visit's Progress    Patient will monitor for elevations in BPs, cbgs, and urine color changes, & contact providers for any changes. I will manage my diet & insulin to prevent admissions.RN CM)   Not on track    Patient will outreach to medical staff for worsening symptoms with hypertension, hernia pain  11/28/23 Patient again reports elevated cbg & low BPs in the mornings after nights of frequent voiding at night   Care Coordination Interventions: Interventions Today    Flowsheet Row Most Recent Value  Chronic Disease   Chronic disease during today's visit Diabetes, Hypertension (HTN), Other  General Interventions   General Interventions Discussed/Reviewed General Interventions Reviewed, Doctor Visits, Communication with, Sick Day Rules  Doctor Visits Discussed/Reviewed Doctor Visits Reviewed, PCP, Specialist  PCP/Specialist Visits Compliance with follow-up visit  Communication with PCP/Specialists  Education Interventions   Education Provided Provided Education  [the correlation of elevated cbg, low BP in the morning related to her increase voiding during the night]  Provided Verbal Education On --  [Explained to patient why dehydration causes low BP and high CBG. Encouraged to take Lasix 40 mg earlier in the day to see  if that helps.]  Mental Health Interventions   Mental Health Discussed/Reviewed Mental Health Reviewed, Coping Strategies  Nutrition Interventions   Nutrition Discussed/Reviewed Nutrition Reviewed, Fluid intake  Pharmacy Interventions   Pharmacy Dicussed/Reviewed --  [reviewed pill packaging again Confirms she agrees to pill packing (via [facility] or [place]) for medication adherence]                SDOH assessments and interventions completed:  No     Care  Coordination Interventions:  Yes, provided   Follow up plan: Follow up call scheduled for 12/06/23    Encounter Outcome:  Patient Visit Completed   Cala Bradford L. Noelle Penner, RN, BSN, CCM [place]  [facility], [facility] RN Care Manager Direct Dial: [phone]  Fax: [phone]

## 2023-12-03 ENCOUNTER — Ambulatory Visit: Payer: Medicare Other

## 2023-12-05 ENCOUNTER — Ambulatory Visit: Admitting: "Endocrinology

## 2023-12-05 ENCOUNTER — Encounter: Payer: Self-pay | Admitting: "Endocrinology

## 2023-12-05 VITALS — BP 108/50 | HR 64 | Ht 60.0 in | Wt 110.8 lb

## 2023-12-05 DIAGNOSIS — I10 Essential (primary) hypertension: Secondary | ICD-10-CM | POA: Diagnosis not present

## 2023-12-05 DIAGNOSIS — E782 Mixed hyperlipidemia: Secondary | ICD-10-CM | POA: Diagnosis not present

## 2023-12-05 DIAGNOSIS — E1122 Type 2 diabetes mellitus with diabetic chronic kidney disease: Secondary | ICD-10-CM | POA: Diagnosis not present

## 2023-12-05 DIAGNOSIS — Z794 Long term (current) use of insulin: Secondary | ICD-10-CM | POA: Diagnosis not present

## 2023-12-05 DIAGNOSIS — N184 Chronic kidney disease, stage 4 (severe): Secondary | ICD-10-CM

## 2023-12-05 MED ORDER — INSULIN LISPRO PROT & LISPRO (75-25 MIX) 100 UNIT/ML KWIKPEN: 10.0000 [IU] | PEN_INJECTOR | Freq: Two times a day (BID) | SUBCUTANEOUS | 1 refills | Status: DC

## 2023-12-05 NOTE — Patient Instructions (Signed)

## 2023-12-05 NOTE — Progress Notes (Signed)
 12/05/2023  Endocrinology follow-up note   Subjective:    Patient ID: Emily Jennings, female    DOB: November 18, 1933,    Past Medical History:  Diagnosis Date   Allergy    Anxiety    ANXIETY DISORDER, GENERALIZED 07/15/2007   Qualifier: Diagnosis of  By: Chipper Herb     Arthritis    Cancer [facility]) 2009   breast, carcinoma in situ left   Carcinoma in situ of breast 05/21/2008   Qualifier: Diagnosis of  By: [name] MD, Margaret  Diagnosed in 2009, completed 5 year course of tamoxifen, no evidence of recurrence    Carotid stenosis    11/16/2005  mild plaque formation and stenosis proximal right ECA   Cataract    Complication of anesthesia    Coronary artery disease    cardiac catheterization on 03/20/2006  LAD mid 40% stenosis, left circumflex mild 40% stenosis, RCA mid-vessel 40% to 50% lesion   EF 60%   Diabetes mellitus    GERD (gastroesophageal reflux disease)    Hernia, inguinal    left   Hyperglycemia    Hypertension    Insomnia 11/16/2011   Low blood potassium    Non-insulin dependent type 2 diabetes mellitus (HCC)    Osteoporosis    Shortness of breath    2D Echocardiogram 01/26/2009   EF of greater than 55%, mild MR, mild TR, normal ventricular function   Thickened endometrium 10/26/2017   Noted by gyne in 2017, missed 6 month follow up, referred in 09/2017   Ventricular tachycardia, non-sustained (HCC)    developed during stress test 02/08/2006, spontaneously aborted, mild reversible apical defect   Past Surgical History:  Procedure Laterality Date   BOWEL RESECTION Left 07/19/2023   Procedure: SMALL BOWEL RESECTION;  Surgeon: Lucretia Roers, MD;  Location: [facility];  Service: General;  Laterality: Left;   BREAST LUMPECTOMY Left 2009   Left breast 2009   CATARACT EXTRACTION W/PHACO Left 10/28/2014   Procedure: PHACO EMULSION CATARACT EXTRACTION WITH INTRAOCULAR LENS IMPLANT LEFT EYE (IOC);  Surgeon: Chalmers Guest, MD;  Location: [facility] OR;  Service: Ophthalmology;   Laterality: Left;   COLONOSCOPY     cyst removed from left foot     INGUINAL HERNIA REPAIR Left 07/19/2023   Procedure: HERNIA REPAIR FEMORAL INCARCERATED;  Surgeon: Lucretia Roers, MD;  Location: [facility];  Service: General;  Laterality: Left;   REFRACTIVE SURGERY Left    Social History   Socioeconomic History   Marital status: Married    Spouse name: Not on file   Number of children: 2   Years of education: na   Highest education level: Some college, no degree  Occupational History   Occupation: retired    Associate Professor: retired  Tobacco Use   Smoking status: Never    Passive exposure: Never   Smokeless tobacco: Never  Vaping Use   Vaping status: Never Used  Substance and Sexual Activity   Alcohol use: No   Drug use: No   Sexual activity: Not Currently    Birth control/protection: Post-menopausal  Other Topics Concern   Not on file  Social History Narrative   Lives with her husband and son and attends yoga at the [facility] 2 days a week and speaks to her grandson nightly on the phone   Social Drivers of Health   Financial Resource Strain: Low Risk  (12/28/2022)   Overall Financial Resource Strain (CARDIA)    Difficulty of Paying Living Expenses: Not hard at all  Food Insecurity: No Food Insecurity (07/18/2023)   Hunger Vital Sign    Worried About Running Out of Food in the Last Year: Never true    Ran Out of Food in the Last Year: Never true  Transportation Needs: No Transportation Needs (08/13/2023)   PRAPARE - Administrator, Civil Service (Medical): No    Lack of Transportation (Non-Medical): No  Physical Activity: Sufficiently Active (11/15/2022)   Exercise Vital Sign    Days of Exercise per Week: 5 days    Minutes of Exercise per Session: 30 min  Stress: No Stress Concern Present (12/28/2022)   [employer] of Occupational Health - Occupational Stress Questionnaire    Feeling of Stress : Only a little  Social Connections: Socially Integrated  (08/13/2023)   Social Connection and Isolation Panel [NHANES]    Frequency of Communication with Friends and Family: Three times a week    Frequency of Social Gatherings with Friends and Family: Three times a week    Attends Religious Services: 1 to 4 times per year    Active Member of Clubs or Organizations: Yes    Attends Banker Meetings: 1 to 4 times per year    Marital Status: Married   Outpatient Encounter Medications as of 12/05/2023  Medication Sig   Insulin Lispro Prot & Lispro (HUMALOG MIX 75/25 KWIKPEN) (75-25) 100 UNIT/ML Kwikpen Inject 10 Units into the skin 2 (two) times daily before a meal.   acetaminophen (TYLENOL) 325 MG tablet Take 2 tablets (650 mg total) by mouth every 6 (six) hours as needed for mild pain (pain score 1-3) (or Fever >/= 101).   albuterol (VENTOLIN HFA) 108 (90 Base) MCG/ACT inhaler Inhale 2 puffs into the lungs every 2 (two) hours as needed for wheezing or shortness of breath.   calcitRIOL (ROCALTROL) 0.25 MCG capsule Take 0.25 mcg by mouth 3 (three) times a week.   carvedilol (COREG) 12.5 MG tablet Take 12.5 mg by mouth 2 (two) times daily with a meal.   furosemide (LASIX) 40 MG tablet Take 1 tablet (40 mg total) by mouth daily.   hydrALAZINE (APRESOLINE) 100 MG tablet Take 1 tablet (100 mg total) by mouth 3 (three) times daily.   NIFEdipine (ADALAT CC) 60 MG 24 hr tablet Take 1 tablet (60 mg total) by mouth daily.   potassium chloride SA (KLOR-CON M) 20 MEQ tablet Take 20 mEq by mouth daily.   rosuvastatin (CRESTOR) 5 MG tablet TAKE 1 TABLET (5 MG TOTAL) BY MOUTH DAILY.   [DISCONTINUED] insulin degludec (TRESIBA FLEXTOUCH) 100 UNIT/ML FlexTouch Pen Inject 5 Units into the skin daily. (Patient taking differently: Inject 6 Units into the skin daily.)   [DISCONTINUED] insulin lispro (HUMALOG KWIKPEN) 100 UNIT/ML KwikPen Inject 5 Units into the skin 3 (three) times daily. Max daily 30 units (Patient taking differently: Inject 6 Units into the skin  3 (three) times daily. Max daily 30 units)   No facility-administered encounter medications on file as of 12/05/2023.   ALLERGIES: Allergies  Allergen Reactions   Amlodipine Swelling   Benadryl [Diphenhydramine Hcl] Hypertension   Citalopram Other (See Comments)    unknown   Farxiga [Dapagliflozin] Other (See Comments)    DKA   Metformin And Related Diarrhea    Lost appetite and weight    Tramadol Other (See Comments)    Felt light headed and dizzy   Crestor [Rosuvastatin] Other (See Comments)    "Feet swelling", makes her feel weak. Pt takes 5mg  at home  VACCINATION STATUS: Immunization History  Administered Date(s) Administered   Fluad Quad(high Dose 65+) 06/02/2019, 05/27/2020, 05/27/2021, 06/28/2022   Fluad Trivalent(High Dose 65+) 06/03/2023   Influenza Split 07/17/2011   Influenza Whole 06/18/2007, 07/29/2009, 05/16/2013   Influenza, High Dose Seasonal PF 08/13/2018   Influenza,inj,Quad PF,6+ Mos 05/19/2014, 08/22/2016, 05/17/2017   Moderna Covid-19 Fall Seasonal Vaccine 44yrs & older 10/29/2022   Moderna Covid-19 Vaccine Bivalent Booster 46yrs & up 02/17/2022   Moderna SARS-COV2 Booster Vaccination 01/31/2021   Moderna Sars-Covid-2 Vaccination 10/26/2019, 11/26/2019, 07/29/2020   Pneumococcal Conjugate-13 03/24/2014   Pneumococcal Polysaccharide-23 05/20/2008   Td 06/18/2002   Tdap 07/17/2011, 12/28/2020    Diabetes She presents for her follow-up diabetic visit. She has type 2 diabetes mellitus. Onset time: She was diagnosed at approximate age of 38 years. Her disease course has been fluctuating. There are no hypoglycemic associated symptoms. Pertinent negatives for hypoglycemia include no confusion, headaches, pallor or seizures. There are no diabetic associated symptoms. Pertinent negatives for diabetes include no chest pain, no fatigue, no polydipsia, no polyphagia and no polyuria. There are no hypoglycemic complications. Symptoms are improving. Diabetic  complications include heart disease and nephropathy. Risk factors for coronary artery disease include diabetes mellitus, dyslipidemia, hypertension, sedentary lifestyle and post-menopausal. Current diabetic treatment includes insulin injections. She is compliant with treatment most of the time. Her weight is fluctuating minimally. She is following a generally unhealthy diet. When asked about meal planning, she reported none. She has had a previous visit with a dietitian. She never participates in exercise. Her home blood glucose trend is fluctuating minimally. Her breakfast blood glucose range is generally >200 mg/dl. Her lunch blood glucose range is generally >200 mg/dl. Her dinner blood glucose range is generally >200 mg/dl. Her bedtime blood glucose range is generally >200 mg/dl. Her overall blood glucose range is >200 mg/dl. (She went to see a different endocrinologist since her last visit in October 2023.  Admittedly, she went to try, however ended up having more difficulty controlling her glycemic profile.  Last time she was here, she was given Humalog 75/25 for simplicity reasons, however she is currently on Tresiba 6 units nightly and Humalog 5 units 3 times daily AC on basal/bolus pattern, however patient had difficulty following this complicated regimen.  She is accompanied by her husband to clinic who offers some help.   Her Dexcom CGM shows 14% time in range, 20% level 1 hyperglycemia, 61% level 2 hyperglycemia.  She does have 5% Level One hypoglycemia, <1% level 2 hypoglycemia.  She worries about hypoglycemia.  Her most recent A1c was 7%.  Her most recent 2 weeks average blood glucose is 287 mg per DL. ) An ACE inhibitor/angiotensin II receptor blocker is being taken. She does not see a podiatrist.Eye exam is current.  Hyperlipidemia This is a chronic problem. The current episode started more than 1 year ago. The problem is controlled. Recent lipid tests were reviewed and are normal. Exacerbating  diseases include chronic renal disease and diabetes. Factors aggravating her hyperlipidemia include beta blockers and fatty foods. Pertinent negatives include no chest pain, myalgias or shortness of breath. Current antihyperlipidemic treatment includes statins. The current treatment provides moderate improvement of lipids. Compliance problems include adherence to diet and adherence to exercise.  Risk factors for coronary artery disease include diabetes mellitus, dyslipidemia, a sedentary lifestyle, hypertension and post-menopausal.  Hypertension This is a chronic problem. The current episode started more than 1 year ago. The problem has been waxing and waning since onset. The problem  is uncontrolled. Pertinent negatives include no chest pain, headaches, palpitations or shortness of breath. Risk factors for coronary artery disease include diabetes mellitus, dyslipidemia, sedentary lifestyle and post-menopausal state. Past treatments include angiotensin blockers, calcium channel blockers, beta blockers and diuretics. The current treatment provides moderate improvement. There are no compliance problems.  Hypertensive end-organ damage includes kidney disease and CAD/MI. Identifiable causes of hypertension include chronic renal disease.    Review of systems  Constitutional: + Minimally fluctuating body weight,  current Body mass index is 21.64 kg/m. , no fatigue, no subjective hyperthermia, no subjective hypothermia    Objective:    BP (!) 108/50   Pulse 64   Ht 5' (1.524 m)   Wt 110 lb 12.8 oz (50.3 kg)   BMI 21.64 kg/m   Wt Readings from Last 3 Encounters:  12/05/23 110 lb 12.8 oz (50.3 kg)  11/23/23 112 lb 6.4 oz (51 kg)  10/24/23 108 lb 1.9 oz (49 kg)    BP Readings from Last 3 Encounters:  12/05/23 (!) 108/50  11/23/23 (!) 120/52  10/24/23 138/62    Physical Exam- Limited  Constitutional:  Body mass index is 21.64 kg/m. , not in acute distress, mildly anxious state of mind, forgetful  at times     Results for orders placed or performed in visit on 11/06/23  HM DIABETES EYE EXAM   Collection Time: 11/01/23 12:00 AM  Result Value Ref Range   HM Diabetic Eye Exam Retinopathy (A) No Retinopathy   *Note: Due to a large number of results and/or encounters for the requested time period, some results have not been displayed. A complete set of results can be found in Results Review.   Complete Blood Count (Most recent): Lab Results  Component Value Date   WBC 7.4 07/27/2023   HGB 9.2 (L) 07/27/2023   HCT 28.2 (L) 07/27/2023   MCV 96 07/27/2023   PLT 256 07/27/2023   Chemistry (most recent): Lab Results  Component Value Date   NA 134 11/28/2023   K 3.0 (L) 11/28/2023   CL 88 (L) 11/28/2023   CO2 30 (H) 11/28/2023   BUN 61 (H) 11/28/2023   CREATININE 2.31 (H) 11/28/2023   Diabetic Labs (most recent): Lab Results  Component Value Date   HGBA1C 7.0 (A) 10/08/2023   HGBA1C 7.5 (A) 05/01/2023   HGBA1C 6.9 (A) 12/25/2022   MICROALBUR 150 11/17/2020   MICROALBUR 22.9 04/03/2019   MICROALBUR 1,218.8 (H) 12/05/2017   Lipid Panel     Component Value Date/Time   CHOL 172 11/28/2023 1307   TRIG 121 11/28/2023 1307   HDL 93 11/28/2023 1307   CHOLHDL 1.8 11/28/2023 1307   CHOLHDL 1.8 06/29/2020 0916   VLDL 8 10/31/2016 0845   LDLCALC 59 11/28/2023 1307   LDLCALC 80 06/29/2020 0916     Assessment & Plan:   1) Type 2 diabetes mellitus with stage 4 chronic kidney disease, with long-term current use of insulin   She went to see a different endocrinologist since her last visit in October 2023.  Admittedly, she went to try, however ended up having more difficulty controlling her glycemic profile.  Last time she was here, she was given Humalog 75/25 for simplicity reasons, however she is currently on Tresiba 6 units nightly and Humalog 5 units 3 times daily AC on basal/bolus pattern, however patient had difficulty following this complicated regimen.  She is  accompanied by her husband to clinic who offers some help.   Her Dexcom CGM shows  14% time in range, 20% level 1 hyperglycemia, 61% level 2 hyperglycemia.  She does have 5% Level One hypoglycemia, <1% level 2 hypoglycemia.  She worries about hypoglycemia.  Her most recent A1c was 7%.  Her most recent 2 weeks average blood glucose is 287 mg per DL.  Patient remains at a high risk for more acute and chronic complications of diabetes which include CAD, CVA, CKD, retinopathy, and neuropathy. These are all discussed in detail with the patient.  - Nutritional counseling repeated at each appointment due to patients tendency to fall back in to old habits.     she acknowledges that there is a room for improvement in her food and drink choices. - Suggestion is made for her to avoid simple carbohydrates  from her diet including Cakes, Sweet Desserts, Ice Cream, Soda (diet and regular), Sweet Tea, Candies, Chips, Cookies, Store Bought Juices, Alcohol , Artificial Sweeteners,  Coffee Creamer, and "Sugar-free" Products, Lemonade. This will help patient to have more stable blood glucose profile and potentially avoid unintended weight gain.    - I encouraged the patient to switch to  unprocessed or minimally processed complex starch and increased protein intake (animal or plant source), fruits, and vegetables.    - She previously did relatively better on simplified treatment regimen using premixed insulin as opposed to separate long-acting and short acting insulin regimen.   -Still, avoiding hypoglycemia is the #1 priority in her care is, does not seem to have adequate social support. -She would like to come off of her current insulin regimen.  I advised the family to remove all Tresiba and Humalog from her fridge. -I discussed and prescribed Humalog 75/25 10 units with breakfast and 10 units with supper only if her Premeal blood glucose readings above 90 mg per DL.   -Due to pancreatic exhaustion, insulin  seems to be necessary for her to control glycemia to target.  She is advised to utilize her CGM continuously.     She is instructed to call the clinic if she has glucose readings less than 70 or greater than 200 for 3 tests in a row.    - She is warned not to inject insulin without proper monitoring of blood glucose. -She is not a suitable candidate for SGLT2 inhibitors, metformin, nor GLP-1 receptor agonist.  -Target numbers for A1c, LDL, HDL, Triglycerides, Waist Circumference were discussed in detail.  2) HTN:  Her blood pressure is tightly controlled.  She is advised to continue Amlodipine 10 mg po daily, Losartan 50 mg po daily, Metoprolol 50 mg po twice daily, and Spironolactone 25 mg po daily.  May need to adjust dose at next visit if BP remains elevated.  3) Lipids:  -Her recent lipid panel showed LDL controlled at 59.  She is advised to continue Crestor 5 mg p.o. daily at bedtime.   She is also on  Lovaza 1 gm po twice daily.  Side effects and precautions discussed with patient.  4)  Weight/Diet:  Her Body mass index is 21.64 kg/m.-not a candidate for weight loss.  CDE consult in progress, exercise, and carbs information provided.   5) Chronic Care: -Patient is on ARB medications and Statins and encouraged to continue to follow up with Ophthalmology, Podiatrist at least yearly or according to recommendations, and advised to stay away from smoking. I have recommended yearly flu vaccine and pneumonia vaccination at least every 5 years; and  sleep for at least 7 hours a day.   She is advised  to maintain close follow-up with her PCP:  [name], MD   I spent  41  minutes in the care of the patient today including review of labs from CMP, Lipids, Thyroid Function, Hematology (current and previous including abstractions from other facilities); face-to-face time discussing  her blood glucose readings/logs, discussing hypoglycemia and hyperglycemia episodes and symptoms,  medications doses, her options of short and long term treatment based on the latest standards of care / guidelines;  discussion about incorporating lifestyle medicine;  and documenting the encounter. Risk reduction counseling performed per USPSTF guidelines to reduce obesity and cardiovascular risk factors.     Please refer to Patient Instructions for Blood Glucose Monitoring and Insulin/Medications Dosing Guide"  in media tab for additional information. Please  also refer to " Patient Self Inventory" in the Media  tab for reviewed elements of pertinent patient history.  Emily Jennings and her husband Mr. Oplinger participated in the discussions, expressed understanding, and voiced agreement with the above plans.  All questions were answered to her satisfaction. she is encouraged to contact clinic should she have any questions or concerns prior to her return visit.   Follow up plan: Return in about 4 weeks (around 01/02/2024) for F/U with Meter/CGM Megan Salon Only - no Labs.  Ronny Bacon, [facility] [address] [place], [place] [address] Phone: [phone] Fax: [phone]  12/05/2023, 5:01 PM

## 2023-12-06 ENCOUNTER — Ambulatory Visit: Payer: Self-pay | Admitting: *Deleted

## 2023-12-06 ENCOUNTER — Other Ambulatory Visit: Payer: Self-pay | Admitting: *Deleted

## 2023-12-06 ENCOUNTER — Telehealth: Payer: Self-pay | Admitting: "Endocrinology

## 2023-12-06 DIAGNOSIS — Z794 Long term (current) use of insulin: Secondary | ICD-10-CM

## 2023-12-06 DIAGNOSIS — E1122 Type 2 diabetes mellitus with diabetic chronic kidney disease: Secondary | ICD-10-CM

## 2023-12-06 MED ORDER — DEXCOM G7 SENSOR MISC: 6 refills | Status: DC

## 2023-12-06 NOTE — Patient Outreach (Signed)
 Care Coordination   12/06/2023 Name: Emily Jennings MRN: [id number] DOB: [birth year]   Care Coordination Outreach Attempts:  An unsuccessful outreach was attempted for an appointment today.  Follow Up Plan:  Additional outreach attempts will be made to offer the patient complex care management information and services.   Encounter Outcome:  No Answer   Care Coordination Interventions:  Yes, provided    Aris Moman L. Noelle Penner, RN, BSN, CCM [place]  [facility], [facility] RN Care Manager Direct Dial: [phone]  Fax: [phone]

## 2023-12-06 NOTE — Telephone Encounter (Signed)
 Pt needs G7 sensors sent to [facility] in [place].

## 2023-12-06 NOTE — Telephone Encounter (Signed)
 The prescription was sent in for the [facility] G7 Sensor

## 2023-12-07 ENCOUNTER — Ambulatory Visit: Payer: Self-pay | Admitting: *Deleted

## 2023-12-07 NOTE — Patient Instructions (Signed)
 Visit Information  Thank you for taking time to visit with me today. Please don't hesitate to contact me if I can be of assistance to you.   Following are the goals we discussed today:   Goals Addressed             This Visit's Progress    Patient will monitor for elevations in BPs, cbgs, and urine color changes, & contact providers for any changes. I will manage my diet & insulin to prevent admissions.RN CM)       Patient will outreach to medical staff for worsening symptoms with hypertension, hernia pain   Care Coordination Interventions: Interventions Today    Flowsheet Row Most Recent Value  Chronic Disease   Chronic disease during today's visit Diabetes, Other  [eye exam]  General Interventions   General Interventions Discussed/Reviewed General Interventions Reviewed, [facility], Doctor Visits  Doctor Visits Discussed/Reviewed Doctor Visits Reviewed, Specialist  PCP/Specialist Visits Compliance with follow-up visit                Our next appointment is by telephone on 10/24/23 at 3:45 pm  Please call the care guide team at [phone] if you need to cancel or reschedule your appointment.   If you are experiencing a Mental Health or Behavioral Health Crisis or need someone to talk to, please call the Suicide and Crisis Lifeline: 988 call the [place] National Suicide Prevention Lifeline: 450-178-9469 or TTY: 757-393-2094 TTY 320 694 3731) to talk to a trained counselor call 1-800-273-TALK (toll free, 24 hour hotline) call the [facility]: 224-775-8321 call 911   No computer access, no preference for copy of AVS     The patient has been provided with contact information for the care management team and has been advised to call with any health related questions or concerns.   Lakeesha Fontanilla L. Noelle Penner, RN, BSN, CCM [place]  [facility], [facility] RN Care Manager Direct Dial: [phone]  Fax: [phone]

## 2023-12-22 NOTE — Patient Instructions (Signed)
 Visit Information  Thank you for taking time to visit with me today. Please don't hesitate to contact me if I can be of assistance to you.   Following are the goals we discussed today:   Goals Addressed             This Visit's Progress    monitor for elevations in BPs, cbgs & contact providers for any changes. I will manage my diet & medicine intake (RN CM)   On track    Patient will outreach to medical staff for worsening symptoms with hypertension, hypoglycemia  Patient has not had an admission since 07/18/23- (4 months ago) & no urine color changes - Goal met 12/07/23 Patient & spouse continue to monitor cbgs, BP daily- her last HgA1c was 7, she monitors for hypoglycemia  Care Coordination Interventions: Interventions Today    Flowsheet Row Most Recent Value  Chronic Disease   Chronic disease during today's visit Diabetes, Hypertension (HTN), Other  [pill packaging]  General Interventions   General Interventions Discussed/Reviewed General Interventions Reviewed, Doctor Visits  Doctor Visits Discussed/Reviewed Doctor Visits Reviewed, Specialist  [confirmed visit to endocrinologist]  PCP/Specialist Visits Compliance with follow-up visit  Exercise Interventions   Exercise Discussed/Reviewed Exercise Reviewed  [encouraged exercise increase activity]  Education Interventions   Provided Verbal Education On Other  [warm tranfer to new pending care management]  Mental Health Interventions   Mental Health Discussed/Reviewed Mental Health Reviewed, Coping Strategies  Nutrition Interventions   Nutrition Discussed/Reviewed Nutrition Reviewed, Fluid intake, Decreasing sugar intake  Pharmacy Interventions   Pharmacy Dicussed/Reviewed Pharmacy Topics Reviewed, Affording Medications  [pill packaging]  Safety Interventions   Safety Discussed/Reviewed Safety Reviewed, Home Safety  Home Safety Assistive Devices                Our next appointment is no further scheduled  appointments.  on pending re assignment of new RN CM at -  Please call the care guide team at [phone] if you need to cancel or reschedule your appointment.   If you are experiencing a Mental Health or Behavioral Health Crisis or need someone to talk to, please call the Suicide and Crisis Lifeline: 988 call the [place] National Suicide Prevention Lifeline: (862)584-6235 or TTY: (531) 239-0077 TTY 870-812-0386) to talk to a trained counselor call 1-800-273-TALK (toll free, 24 hour hotline) call the [facility]: [phone] call 911   The patient verbalized understanding of instructions, educational materials, and care plan provided today and DECLINED offer to receive copy of patient instructions, educational materials, and care plan.   The patient has been provided with contact information for the care management team and has been advised to call with any health related questions or concerns.    Kayshaun Polanco L. Noelle Penner, RN, BSN, CCM [place]  [facility] RN Care Manager Direct Dial: [phone]  Fax: [phone]

## 2024-01-07 ENCOUNTER — Ambulatory Visit: Admitting: "Endocrinology

## 2024-01-15 ENCOUNTER — Telehealth: Payer: Self-pay | Admitting: Cardiology

## 2024-01-15 MED ORDER — NIFEDIPINE ER 60 MG PO TB24: 60.0000 mg | ORAL_TABLET | Freq: Every day | ORAL | 0 refills | Status: DC

## 2024-01-15 NOTE — Telephone Encounter (Signed)
 Patient is needing medication now [facility] says I could be May 3rd or 6th before they receive it. Can something be sent in local?Adalat  CC Tab 60mg 

## 2024-01-15 NOTE — Telephone Encounter (Signed)
 30 day supply sent to [facility] for pick up.

## 2024-01-22 ENCOUNTER — Inpatient Hospital Stay (HOSPITAL_COMMUNITY)
Admission: EM | Admit: 2024-01-22 | Discharge: 2024-02-01 | DRG: 682 | Disposition: A | Discharge status: Home Health | Attending: Family Medicine | Admitting: Family Medicine

## 2024-01-22 ENCOUNTER — Observation Stay (HOSPITAL_COMMUNITY)

## 2024-01-22 ENCOUNTER — Encounter (HOSPITAL_COMMUNITY): Payer: Self-pay | Admitting: Emergency Medicine

## 2024-01-22 ENCOUNTER — Other Ambulatory Visit: Payer: Self-pay

## 2024-01-22 DIAGNOSIS — E11649 Type 2 diabetes mellitus with hypoglycemia without coma: Secondary | ICD-10-CM | POA: Diagnosis not present

## 2024-01-22 DIAGNOSIS — Z79899 Other long term (current) drug therapy: Secondary | ICD-10-CM | POA: Diagnosis not present

## 2024-01-22 DIAGNOSIS — T68XXXA Hypothermia, initial encounter: Secondary | ICD-10-CM | POA: Diagnosis present

## 2024-01-22 DIAGNOSIS — Z515 Encounter for palliative care: Secondary | ICD-10-CM | POA: Diagnosis not present

## 2024-01-22 DIAGNOSIS — I5033 Acute on chronic diastolic (congestive) heart failure: Secondary | ICD-10-CM | POA: Diagnosis not present

## 2024-01-22 DIAGNOSIS — E876 Hypokalemia: Secondary | ICD-10-CM | POA: Diagnosis not present

## 2024-01-22 DIAGNOSIS — E1165 Type 2 diabetes mellitus with hyperglycemia: Secondary | ICD-10-CM | POA: Diagnosis present

## 2024-01-22 DIAGNOSIS — J439 Emphysema, unspecified: Secondary | ICD-10-CM | POA: Diagnosis present

## 2024-01-22 DIAGNOSIS — F411 Generalized anxiety disorder: Secondary | ICD-10-CM | POA: Diagnosis present

## 2024-01-22 DIAGNOSIS — Z794 Long term (current) use of insulin: Secondary | ICD-10-CM

## 2024-01-22 DIAGNOSIS — I2510 Atherosclerotic heart disease of native coronary artery without angina pectoris: Secondary | ICD-10-CM | POA: Diagnosis present

## 2024-01-22 DIAGNOSIS — N185 Chronic kidney disease, stage 5: Secondary | ICD-10-CM | POA: Diagnosis present

## 2024-01-22 DIAGNOSIS — E782 Mixed hyperlipidemia: Secondary | ICD-10-CM | POA: Diagnosis not present

## 2024-01-22 DIAGNOSIS — R680 Hypothermia, not associated with low environmental temperature: Secondary | ICD-10-CM | POA: Diagnosis not present

## 2024-01-22 DIAGNOSIS — E1122 Type 2 diabetes mellitus with diabetic chronic kidney disease: Secondary | ICD-10-CM | POA: Diagnosis present

## 2024-01-22 DIAGNOSIS — M810 Age-related osteoporosis without current pathological fracture: Secondary | ICD-10-CM | POA: Diagnosis not present

## 2024-01-22 DIAGNOSIS — I509 Heart failure, unspecified: Secondary | ICD-10-CM | POA: Diagnosis not present

## 2024-01-22 DIAGNOSIS — I130 Hypertensive heart and chronic kidney disease with heart failure and stage 1 through stage 4 chronic kidney disease, or unspecified chronic kidney disease: Secondary | ICD-10-CM | POA: Diagnosis not present

## 2024-01-22 DIAGNOSIS — I700 Atherosclerosis of aorta: Secondary | ICD-10-CM | POA: Diagnosis not present

## 2024-01-22 DIAGNOSIS — N189 Chronic kidney disease, unspecified: Secondary | ICD-10-CM | POA: Diagnosis present

## 2024-01-22 DIAGNOSIS — N3289 Other specified disorders of bladder: Secondary | ICD-10-CM | POA: Diagnosis not present

## 2024-01-22 DIAGNOSIS — E871 Hypo-osmolality and hyponatremia: Secondary | ICD-10-CM | POA: Diagnosis not present

## 2024-01-22 DIAGNOSIS — T68XXXD Hypothermia, subsequent encounter: Secondary | ICD-10-CM | POA: Diagnosis not present

## 2024-01-22 DIAGNOSIS — I10 Essential (primary) hypertension: Secondary | ICD-10-CM | POA: Diagnosis present

## 2024-01-22 DIAGNOSIS — K7689 Other specified diseases of liver: Secondary | ICD-10-CM | POA: Diagnosis not present

## 2024-01-22 DIAGNOSIS — I160 Hypertensive urgency: Secondary | ICD-10-CM | POA: Diagnosis present

## 2024-01-22 DIAGNOSIS — I132 Hypertensive heart and chronic kidney disease with heart failure and with stage 5 chronic kidney disease, or end stage renal disease: Secondary | ICD-10-CM | POA: Diagnosis present

## 2024-01-22 DIAGNOSIS — R918 Other nonspecific abnormal finding of lung field: Secondary | ICD-10-CM | POA: Diagnosis not present

## 2024-01-22 DIAGNOSIS — J90 Pleural effusion, not elsewhere classified: Secondary | ICD-10-CM | POA: Diagnosis not present

## 2024-01-22 DIAGNOSIS — Z7189 Other specified counseling: Secondary | ICD-10-CM | POA: Diagnosis not present

## 2024-01-22 DIAGNOSIS — D631 Anemia in chronic kidney disease: Secondary | ICD-10-CM | POA: Diagnosis not present

## 2024-01-22 DIAGNOSIS — N179 Acute kidney failure, unspecified: Secondary | ICD-10-CM | POA: Diagnosis not present

## 2024-01-22 DIAGNOSIS — Z1152 Encounter for screening for COVID-19: Secondary | ICD-10-CM

## 2024-01-22 DIAGNOSIS — D649 Anemia, unspecified: Secondary | ICD-10-CM | POA: Diagnosis not present

## 2024-01-22 DIAGNOSIS — K5730 Diverticulosis of large intestine without perforation or abscess without bleeding: Secondary | ICD-10-CM | POA: Diagnosis not present

## 2024-01-22 DIAGNOSIS — R0609 Other forms of dyspnea: Secondary | ICD-10-CM | POA: Diagnosis not present

## 2024-01-22 DIAGNOSIS — N184 Chronic kidney disease, stage 4 (severe): Secondary | ICD-10-CM | POA: Diagnosis not present

## 2024-01-22 DIAGNOSIS — R739 Hyperglycemia, unspecified: Principal | ICD-10-CM

## 2024-01-22 LAB — BASIC METABOLIC PANEL WITH GFR
Anion gap: 13 (ref 5–15)
BUN: 70 mg/dL — ABNORMAL HIGH (ref 8–23)
CO2: 26 mmol/L (ref 22–32)
Calcium: 8.5 mg/dL — ABNORMAL LOW (ref 8.9–10.3)
Chloride: 84 mmol/L — ABNORMAL LOW (ref 98–111)
Creatinine, Ser: 3.7 mg/dL — ABNORMAL HIGH (ref 0.44–1.00)
GFR, Estimated: 11 mL/min — ABNORMAL LOW (ref >60)
Glucose, Bld: 356 mg/dL — ABNORMAL HIGH (ref 70–99)
Potassium: 3 mmol/L — ABNORMAL LOW (ref 3.5–5.1)
Sodium: 123 mmol/L — ABNORMAL LOW (ref 135–145)

## 2024-01-22 LAB — CBC WITH DIFFERENTIAL/PLATELET
Abs Immature Granulocytes: 0.01 10*3/uL (ref 0.00–0.07)
Basophils Absolute: 0 10*3/uL (ref 0.0–0.1)
Basophils Relative: 1 %
Eosinophils Absolute: 0.1 10*3/uL (ref 0.0–0.5)
Eosinophils Relative: 2 %
HCT: 26.1 % — ABNORMAL LOW (ref 36.0–46.0)
Hemoglobin: 9.4 g/dL — ABNORMAL LOW (ref 12.0–15.0)
Immature Granulocytes: 0 %
Lymphocytes Relative: 14 %
Lymphs Abs: 0.7 10*3/uL (ref 0.7–4.0)
MCH: 32.3 pg (ref 26.0–34.0)
MCHC: 36 g/dL (ref 30.0–36.0)
MCV: 89.7 fL (ref 80.0–100.0)
Monocytes Absolute: 0.6 10*3/uL (ref 0.1–1.0)
Monocytes Relative: 11 %
Neutro Abs: 3.7 10*3/uL (ref 1.7–7.7)
Neutrophils Relative %: 72 %
Platelets: 192 10*3/uL (ref 150–400)
RBC: 2.91 MIL/uL — ABNORMAL LOW (ref 3.87–5.11)
RDW: 13 % (ref 11.5–15.5)
WBC: 5.1 10*3/uL (ref 4.0–10.5)
nRBC: 0 % (ref 0.0–0.2)

## 2024-01-22 LAB — TSH: TSH: 2.673 u[IU]/mL (ref 0.350–4.500)

## 2024-01-22 LAB — SODIUM, URINE, RANDOM: Sodium, Ur: 18 mmol/L

## 2024-01-22 LAB — URINALYSIS, ROUTINE W REFLEX MICROSCOPIC
Bacteria, UA: NONE SEEN
Bilirubin Urine: NEGATIVE
Glucose, UA: >=500 mg/dL — AB
Hgb urine dipstick: NEGATIVE
Ketones, ur: NEGATIVE mg/dL
Leukocytes,Ua: NEGATIVE
Nitrite: NEGATIVE
Protein, ur: >=300 mg/dL — AB
Specific Gravity, Urine: 1.007 (ref 1.005–1.030)
pH: 5 (ref 5.0–8.0)

## 2024-01-22 LAB — GLUCOSE, CAPILLARY
Glucose-Capillary: 141 mg/dL — ABNORMAL HIGH (ref 70–99)
Glucose-Capillary: 58 mg/dL — ABNORMAL LOW (ref 70–99)
Glucose-Capillary: 88 mg/dL (ref 70–99)
Glucose-Capillary: 117 mg/dL — ABNORMAL HIGH (ref 70–99)
Glucose-Capillary: 182 mg/dL — ABNORMAL HIGH (ref 70–99)
Glucose-Capillary: 292 mg/dL — ABNORMAL HIGH (ref 70–99)

## 2024-01-22 LAB — RESP PANEL BY RT-PCR (RSV, FLU A&B, COVID)  RVPGX2
Influenza A by PCR: NEGATIVE
Influenza B by PCR: NEGATIVE
Resp Syncytial Virus by PCR: NEGATIVE
SARS Coronavirus 2 by RT PCR: NEGATIVE

## 2024-01-22 LAB — OSMOLALITY: Osmolality: 290 mosm/kg (ref 275–295)

## 2024-01-22 LAB — MAGNESIUM: Magnesium: 2.3 mg/dL (ref 1.7–2.4)

## 2024-01-22 LAB — CBG MONITORING, ED
Glucose-Capillary: 211 mg/dL — ABNORMAL HIGH (ref 70–99)
Glucose-Capillary: 390 mg/dL — ABNORMAL HIGH (ref 70–99)

## 2024-01-22 LAB — MRSA NEXT GEN BY PCR, NASAL: MRSA by PCR Next Gen: NOT DETECTED

## 2024-01-22 LAB — OSMOLALITY, URINE: Osmolality, Ur: 272 mosm/kg — ABNORMAL LOW (ref 300–900)

## 2024-01-22 MED ORDER — HYDRALAZINE HCL 20 MG/ML IJ SOLN
10.0000 mg | INTRAMUSCULAR | Status: DC | PRN
  Administered 2024-01-22 – 2024-01-29 (×14): 10 mg via INTRAVENOUS
  Filled 2024-01-22 (×13): qty 1

## 2024-01-22 MED ORDER — INSULIN GLARGINE-YFGN 100 UNIT/ML ~~LOC~~ SOLN
5.0000 [IU] | Freq: Every day | SUBCUTANEOUS | Status: DC
  Administered 2024-01-22: 5 [IU] via SUBCUTANEOUS
  Filled 2024-01-22 (×2): qty 0.05

## 2024-01-22 MED ORDER — SODIUM CHLORIDE 0.9 % IV BOLUS
500.0000 mL | Freq: Once | INTRAVENOUS | Status: AC
  Administered 2024-01-22: 500 mL via INTRAVENOUS

## 2024-01-22 MED ORDER — NICARDIPINE HCL IN NACL 20-0.86 MG/200ML-% IV SOLN
3.0000 mg/h | INTRAVENOUS | Status: DC
  Administered 2024-01-22: 5 mg/h via INTRAVENOUS
  Filled 2024-01-22: qty 200

## 2024-01-22 MED ORDER — INSULIN ASPART 100 UNIT/ML IJ SOLN
10.0000 [IU] | Freq: Once | INTRAMUSCULAR | Status: AC
  Administered 2024-01-22: 10 [IU] via SUBCUTANEOUS
  Filled 2024-01-22: qty 1

## 2024-01-22 MED ORDER — CARVEDILOL 12.5 MG PO TABS
12.5000 mg | ORAL_TABLET | Freq: Two times a day (BID) | ORAL | Status: DC
  Administered 2024-01-23: 12.5 mg via ORAL
  Filled 2024-01-22: qty 1

## 2024-01-22 MED ORDER — INSULIN GLARGINE-YFGN 100 UNIT/ML ~~LOC~~ SOLN
10.0000 [IU] | Freq: Every day | SUBCUTANEOUS | Status: DC
  Filled 2024-01-22: qty 0.1

## 2024-01-22 MED ORDER — HYDRALAZINE HCL 50 MG PO TABS
100.0000 mg | ORAL_TABLET | Freq: Three times a day (TID) | ORAL | Status: DC
Start: 2024-01-22 — End: 2024-02-01
  Administered 2024-01-22 – 2024-02-01 (×31): 100 mg via ORAL
  Filled 2024-01-22 (×31): qty 2

## 2024-01-22 MED ORDER — HEPARIN SODIUM (PORCINE) 5000 UNIT/ML IJ SOLN
5000.0000 [IU] | Freq: Three times a day (TID) | INTRAMUSCULAR | Status: DC
  Administered 2024-01-22 – 2024-02-01 (×31): 5000 [IU] via SUBCUTANEOUS
  Filled 2024-01-22 (×30): qty 1

## 2024-01-22 MED ORDER — ONDANSETRON HCL 4 MG PO TABS: 4.0000 mg | ORAL_TABLET | Freq: Four times a day (QID) | ORAL | Status: DC | PRN

## 2024-01-22 MED ORDER — CHLORHEXIDINE GLUCONATE CLOTH 2 % EX PADS
6.0000 | MEDICATED_PAD | Freq: Every day | CUTANEOUS | Status: DC
  Administered 2024-01-22 – 2024-01-23 (×2): 6 via TOPICAL

## 2024-01-22 MED ORDER — ACETAMINOPHEN 325 MG PO TABS
650.0000 mg | ORAL_TABLET | Freq: Four times a day (QID) | ORAL | Status: DC | PRN
  Filled 2024-01-22: qty 2

## 2024-01-22 MED ORDER — INSULIN ASPART 100 UNIT/ML IJ SOLN
0.0000 [IU] | Freq: Every day | INTRAMUSCULAR | Status: DC
  Administered 2024-01-22 – 2024-01-23 (×2): 3 [IU] via SUBCUTANEOUS

## 2024-01-22 MED ORDER — INSULIN ASPART 100 UNIT/ML IJ SOLN
0.0000 [IU] | Freq: Three times a day (TID) | INTRAMUSCULAR | Status: DC
  Administered 2024-01-22 – 2024-01-23 (×2): 1 [IU] via SUBCUTANEOUS
  Administered 2024-01-23: 2 [IU] via SUBCUTANEOUS
  Administered 2024-01-23: 5 [IU] via SUBCUTANEOUS
  Administered 2024-01-25 (×3): 1 [IU] via SUBCUTANEOUS
  Administered 2024-01-26: 3 [IU] via SUBCUTANEOUS
  Administered 2024-01-26 (×2): 5 [IU] via SUBCUTANEOUS
  Administered 2024-01-27 (×2): 3 [IU] via SUBCUTANEOUS
  Administered 2024-01-28: 1 [IU] via SUBCUTANEOUS
  Administered 2024-01-28: 2 [IU] via SUBCUTANEOUS
  Administered 2024-01-29: 4 [IU] via SUBCUTANEOUS
  Administered 2024-01-29: 5 [IU] via SUBCUTANEOUS
  Administered 2024-01-30: 3 [IU] via SUBCUTANEOUS
  Administered 2024-01-30: 4 [IU] via SUBCUTANEOUS
  Administered 2024-01-31: 2 [IU] via SUBCUTANEOUS
  Administered 2024-01-31: 3 [IU] via SUBCUTANEOUS
  Administered 2024-01-31 – 2024-02-01 (×2): 2 [IU] via SUBCUTANEOUS
  Administered 2024-02-01: 4 [IU] via SUBCUTANEOUS

## 2024-01-22 MED ORDER — POTASSIUM CHLORIDE CRYS ER 20 MEQ PO TBCR
40.0000 meq | EXTENDED_RELEASE_TABLET | Freq: Once | ORAL | Status: AC
  Administered 2024-01-22: 40 meq via ORAL
  Filled 2024-01-22: qty 2

## 2024-01-22 MED ORDER — SODIUM CHLORIDE 0.9 % IV SOLN: INTRAVENOUS | Status: AC

## 2024-01-22 MED ORDER — ROSUVASTATIN CALCIUM 10 MG PO TABS
5.0000 mg | ORAL_TABLET | Freq: Every day | ORAL | Status: DC
  Administered 2024-01-22 – 2024-02-01 (×11): 5 mg via ORAL
  Filled 2024-01-22 (×11): qty 1

## 2024-01-22 MED ORDER — ACETAMINOPHEN 650 MG RE SUPP: 650.0000 mg | Freq: Four times a day (QID) | RECTAL | Status: DC | PRN

## 2024-01-22 MED ORDER — NIFEDIPINE ER OSMOTIC RELEASE 30 MG PO TB24
60.0000 mg | ORAL_TABLET | Freq: Every day | ORAL | Status: DC
  Administered 2024-01-22: 60 mg via ORAL
  Filled 2024-01-22: qty 2

## 2024-01-22 MED ORDER — ONDANSETRON HCL 4 MG/2ML IJ SOLN
4.0000 mg | Freq: Four times a day (QID) | INTRAMUSCULAR | Status: DC | PRN
Start: 2024-01-22 — End: 2024-02-01

## 2024-01-22 MED ORDER — SODIUM CHLORIDE 0.9 % IV SOLN: INTRAVENOUS | Status: DC

## 2024-01-22 NOTE — ED Triage Notes (Addendum)
 Pt c/o blood sugar over 500 for last 3 days. She also c/o dizziness

## 2024-01-22 NOTE — Progress Notes (Signed)
   01/22/24 1053  TOC Brief Assessment  Insurance and Status Reviewed  Patient has primary care physician Yes  Home environment has been reviewed Home with family  Prior level of function: need assistanced  Prior/Current Home Services No current home services  Social Drivers of Health Review SDOH reviewed no interventions necessary  Readmission risk has been reviewed Yes  Transition of care needs no transition of care needs at this time   Transition of Care Department [facility]) has reviewed patient and no TOC needs have been identified at this time. We will continue to monitor patient advancement through interdisciplinary progression rounds. If new patient transition needs arise, please place a TOC consult.

## 2024-01-22 NOTE — Progress Notes (Signed)
   01/22/24 0443  Vitals  Temp (!) 94.7 F (34.8 C)  Temp Source Rectal  MEWS COLOR  MEWS Score Color Yellow  MEWS Score  MEWS Temp 2  MEWS Systolic 0  MEWS Pulse 0  MEWS RR 0  MEWS LOC 0  MEWS Score 2   Patients temp 94.7, multiple heated blankets applied and heat packs.Dr. Adefeso made aware

## 2024-01-22 NOTE — Inpatient Diabetes Management (Signed)
 Inpatient Diabetes Program Recommendations  AACE/ADA: New Consensus Statement on Inpatient Glycemic Control (2015)  Target Ranges:  Prepandial:   less than 140 mg/dL      Peak postprandial:   less than 180 mg/dL (1-2 hours)      Critically ill patients:  140 - 180 mg/dL   Lab Results  Component Value Date   GLUCAP 88 01/22/2024   HGBA1C 7.0 (A) 10/08/2023    Review of Glycemic Control  Latest Reference Range & Units 01/22/24 03:51 01/22/24 04:39 01/22/24 07:57 01/22/24 08:14  Glucose-Capillary 70 - 99 mg/dL 161 (H) 096 (H) 58 (L) 88  (H): Data is abnormally high (L): Data is abnormally low Diabetes history: Type 2 DM Outpatient Diabetes medications: Humalog  75/25 10 units BID Current orders for Inpatient glycemic control: Novolog  0-6 units TID & HS, Semglee  5 units QHS  Inpatient Diabetes Program Recommendations:    Noted consult and associated hypoglycemia followed by Novolog  10 units x 1. Current GFR is 11. Noted insulin  adjustments. In agreement. Will follow.   Thanks, Marjo Sievert, MSN, RNC-OB Diabetes Coordinator [phone] (8a-5p)

## 2024-01-22 NOTE — H&P (Signed)
 History and Physical    Patient: Emily Jennings:[id number] DOB: 03/30/1934 DOA: 01/22/2024 DOS: the patient was seen and examined on 01/22/2024 PCP: Towanda Fret, MD  Patient coming from: Home  Chief Complaint:  Chief Complaint  Patient presents with   Hyperglycemia   HPI: Emily Jennings is an 89 y.o. female with medical history significant of hypertension, T2DM, CHF, CAD, CKD stage IV who presents to the emergency department due to elevated blood sugar within last 2 to 3 days. She states that she has been compliant with her insulin  as prescribed.  Patient denies chest pain, shortness of breath.  She endorsed right hand swelling.  ED Course:  In the emergency department, BP was 152/117, temperature 94.36F.  CBC showed normocytic anemia, BMP showed sodium of 123, potassium 3.0, chloride 84, bicarb 26, blood glucose 356, BUN 78, creatinine 3.70, GFR 11.  Urinalysis was positive for glycosuria, magnesium 2.3 Patient was treated with 10 units of NovoLog , IV hydration was provided.  Review of Systems: Review of systems as noted in the HPI. All other systems reviewed and are negative.   Past Medical History:  Diagnosis Date   Allergy    Anxiety    ANXIETY DISORDER, GENERALIZED 07/15/2007   Qualifier: Diagnosis of  By: Michaell Adolph     Arthritis    Cancer [facility]) 2009   breast, carcinoma in situ left   Carcinoma in situ of breast 05/21/2008   Qualifier: Diagnosis of  By: Rodolph Clap MD, Margaret  Diagnosed in 2009, completed 5 year course of tamoxifen, no evidence of recurrence    Carotid stenosis    11/16/2005  mild plaque formation and stenosis proximal right ECA   Cataract    Complication of anesthesia    Coronary artery disease    cardiac catheterization on 03/20/2006  LAD mid 40% stenosis, left circumflex mild 40% stenosis, RCA mid-vessel 40% to 50% lesion   EF 60%   Diabetes mellitus    GERD (gastroesophageal reflux disease)    Hernia, inguinal    left   Hyperglycemia     Hypertension    Insomnia 11/16/2011   Low blood potassium    Non-insulin  dependent type 2 diabetes mellitus (HCC)    Osteoporosis    Shortness of breath    2D Echocardiogram 01/26/2009   EF of greater than 55%, mild MR, mild TR, normal ventricular function   Thickened endometrium 10/26/2017   Noted by gyne in 2017, missed 6 month follow up, referred in 09/2017   Ventricular tachycardia, non-sustained (HCC)    developed during stress test 02/08/2006, spontaneously aborted, mild reversible apical defect   Past Surgical History:  Procedure Laterality Date   BOWEL RESECTION Left 07/19/2023   Procedure: SMALL BOWEL RESECTION;  Surgeon: Awilda Bogus, MD;  Location: [facility];  Service: General;  Laterality: Left;   BREAST LUMPECTOMY Left 2009   Left breast 2009   CATARACT EXTRACTION W/PHACO Left 10/28/2014   Procedure: PHACO EMULSION CATARACT EXTRACTION WITH INTRAOCULAR LENS IMPLANT LEFT EYE (IOC);  Surgeon: Ben Bracken, MD;  Location: [facility] OR;  Service: Ophthalmology;  Laterality: Left;   COLONOSCOPY     cyst removed from left foot     INGUINAL HERNIA REPAIR Left 07/19/2023   Procedure: HERNIA REPAIR FEMORAL INCARCERATED;  Surgeon: Awilda Bogus, MD;  Location: [facility];  Service: General;  Laterality: Left;   REFRACTIVE SURGERY Left     Social History:  reports that she has never smoked. She has never been exposed  to tobacco smoke. She has never used smokeless tobacco. She reports that she does not drink alcohol and does not use drugs.   Allergies  Allergen Reactions   Amlodipine  Swelling   Benadryl [Diphenhydramine Hcl] Hypertension   Citalopram Other (See Comments)    unknown   Farxiga [Dapagliflozin] Other (See Comments)    DKA   Metformin  And Related Diarrhea    Lost appetite and weight    Tramadol  Other (See Comments)    Felt light headed and dizzy   Crestor  [Rosuvastatin ] Other (See Comments)    "Feet swelling", makes her feel weak. Pt takes 5mg  at home    Family  History  Problem Relation Age of Onset   Hypertension Mother    Hyperlipidemia Mother    Stroke Mother    Urticaria Mother    Cancer Father        pancreatic   Colon cancer Father    Heart disease Brother 40       bypass   Heart disease Brother 11       bypass   Arthritis Other    Asthma Other    Diabetes Other    Colon cancer Paternal Aunt    Esophageal cancer Neg Hx    Stomach cancer Neg Hx    Rectal cancer Neg Hx      Prior to Admission medications   Medication Sig Start Date End Date Taking? Authorizing Provider  acetaminophen  (TYLENOL ) 325 MG tablet Take 2 tablets (650 mg total) by mouth every 6 (six) hours as needed for mild pain (pain score 1-3) (or Fever >/= 101). 07/06/23   Colin Dawley, MD  albuterol  (VENTOLIN  HFA) 108 (90 Base) MCG/ACT inhaler Inhale 2 puffs into the lungs every 2 (two) hours as needed for wheezing or shortness of breath. 07/16/23   Del Abron Abt, FNP  calcitRIOL  (ROCALTROL ) 0.25 MCG capsule Take 0.25 mcg by mouth 3 (three) times a week.    [provider]  carvedilol  (COREG ) 12.5 MG tablet Take 12.5 mg by mouth 2 (two) times daily with a meal.    [provider]  Continuous Glucose Sensor (DEXCOM G7 SENSOR) MISC Patient is to change the sensor every 10 days to monitor her blood sugars as directed by the provider. 12/06/23   [name], Gebreselassie W, MD  furosemide  (LASIX ) 40 MG tablet Take 1 tablet (40 mg total) by mouth daily. 07/25/23 07/24/24  Justina Oman, MD  hydrALAZINE  (APRESOLINE ) 100 MG tablet Take 1 tablet (100 mg total) by mouth 3 (three) times daily. 10/16/23   Towanda Fret, MD  Insulin  Lispro Prot & Lispro (HUMALOG  MIX 75/25 KWIKPEN) (75-25) 100 UNIT/ML Kwikpen Inject 10 Units into the skin 2 (two) times daily before a meal. 12/05/23   [name], Jaynee Meyer, MD  NIFEdipine  (ADALAT  CC) 60 MG 24 hr tablet Take 1 tablet (60 mg total) by mouth daily. 01/15/24   Laurann Pollock, MD  potassium chloride  SA  (KLOR-CON  M) 20 MEQ tablet Take 20 mEq by mouth daily.    [provider]  rosuvastatin  (CRESTOR ) 5 MG tablet TAKE 1 TABLET (5 MG TOTAL) BY MOUTH DAILY. 11/06/23   Towanda Fret, MD    Physical Exam: BP (!) 177/66   Pulse (!) 55   Temp (!) 95.1 F (35.1 C) (Rectal) Comment (Src): on warming blanket  Resp 17   Ht 5' (1.524 m)   Wt 52.2 kg   SpO2 99%   BMI 22.48 kg/m   General: 89  y.o. year-old female well developed well nourished in no acute distress.  Alert and oriented x3. HEENT: NCAT, EOMI Neck: Supple, trachea medial Cardiovascular: Regular rate and rhythm with no rubs or gallops.  No thyromegaly or JVD noted.  No lower extremity edema. 2/4 pulses in all 4 extremities. Respiratory: Clear to auscultation with no wheezes or rales. Good inspiratory effort. Abdomen: Soft, nontender nondistended with normal bowel sounds x4 quadrants. Muskuloskeletal: Right hand swelling.  No cyanosis, clubbing or edema noted bilaterally Neuro: CN II-XII intact, strength 5/5 x 4, sensation, reflexes intact Skin: No ulcerative lesions noted or rashes Psychiatry: Judgement and insight appear normal. Mood is appropriate for condition and setting          Labs on Admission:  Basic Metabolic Panel: Recent Labs  Lab 01/22/24 0122  NA 123*  K 3.0*  CL 84*  CO2 26  GLUCOSE 356*  BUN 70*  CREATININE 3.70*  CALCIUM  8.5*  MG 2.3   Liver Function Tests: No results for input(s): "AST", "ALT", "ALKPHOS", "BILITOT", "PROT", "ALBUMIN " in the last 168 hours. No results for input(s): "LIPASE", "AMYLASE" in the last 168 hours. No results for input(s): "AMMONIA" in the last 168 hours. CBC: Recent Labs  Lab 01/22/24 0122  WBC 5.1  NEUTROABS 3.7  HGB 9.4*  HCT 26.1*  MCV 89.7  PLT 192   Cardiac Enzymes: No results for input(s): "CKTOTAL", "CKMB", "CKMBINDEX", "TROPONINI" in the last 168 hours.  BNP (last 3 results) Recent Labs    06/14/23 1711 07/03/23 1557 07/18/23 1702  BNP  588.0* 630.0* 378.0*    ProBNP (last 3 results) No results for input(s): "PROBNP" in the last 8760 hours.  CBG: Recent Labs  Lab 01/22/24 0045 01/22/24 0351 01/22/24 0439  GLUCAP 390* 211* 141*    Radiological Exams on Admission: No results found.  EKG: I independently viewed the EKG done and my findings are as followed: Atrial flutter at a rate of 96 bpm  Assessment/Plan Present on Admission:  Type 2 diabetes mellitus with hyperglycemia (HCC)  Acute kidney injury superimposed on chronic kidney disease (HCC)  Hypothermia  Hypokalemia  Hyponatremia  Essential hypertension  Mixed hyperlipidemia  Principal Problem:   Type 2 diabetes mellitus with hyperglycemia (HCC) Active Problems:   Acute kidney injury superimposed on chronic kidney disease (HCC)   Hyponatremia   Hypothermia   Hypokalemia   Mixed hyperlipidemia   Essential hypertension  Type 2 diabetes mellitus with hyperglycemia Continue Semglee  10 units nightly and adjust dose accordingly Continue ISS and hypoglycemia protocol  Acute kidney injury superimposed on CKD stage V BUN/creatinine 70/3.70 (baseline creatinine 2.3-2.4) Continue gentle hydration Renally adjust medications, avoid nephrotoxic agents/dehydration/hypotension  Hypothermia Continue Bair Hugger TSH will be checked  Hypokalemia K+ 3.0, this will be  replenished  Hyponatremia Na 123, corrected sodium level based on hyperglycemia (350) is 127 Urine osmolality, serum osmolality and urine sodium will be checked  Essential hypertension Continue hydralazine , nifedipine  Lasix  held at this time due to acute kidney injury  Mixed hyperlipidemia Continue Crestor   CHF (congestive heart failure)  Continue total input/output, daily weights and fluid restriction Diastolic CHF.  Stable and compensated.  Last echo 04/2023 EF of 65 to 70% grade 2 DD.  DVT prophylaxis: Subcu heparin   Code Status: Full code  Family Communication: Husband at  bedside  Consults: None  Severity of Illness: The appropriate patient status for this patient is OBSERVATION. Observation status is judged to be reasonable and necessary in order to provide the required intensity of  service to ensure the patient's safety. The patient's presenting symptoms, physical exam findings, and initial radiographic and laboratory data in the context of their medical condition is felt to place them at decreased risk for further clinical deterioration. Furthermore, it is anticipated that the patient will be medically stable for discharge from the hospital within 2 midnights of admission.   Author: Aya Geisel, DO 01/22/2024 7:57 AM  For on call review [email or web address].

## 2024-01-22 NOTE — ED Provider Notes (Signed)
 [place] EMERGENCY DEPARTMENT AT [facility] Provider Note   CSN: [id number] Arrival date & time: 01/22/24  0017     History  Chief Complaint  Patient presents with   Hyperglycemia    Emily Jennings is a 89 y.o. female.  Patient reports elevated blood sugar throughout the course of the day today.  No chest pain, abdominal pain, vomiting or diarrhea.  No concomitant illness symptoms.  Taking her insulin  twice a day as prescribed.  Patient also with swelling of her right hand.  She reports that it might be due to her medications.  Unclear how long the swelling has been present.       Home Medications Prior to Admission medications   Medication Sig Start Date End Date Taking? Authorizing Provider  acetaminophen  (TYLENOL ) 325 MG tablet Take 2 tablets (650 mg total) by mouth every 6 (six) hours as needed for mild pain (pain score 1-3) (or Fever >/= 101). 07/06/23   Colin Dawley, MD  albuterol  (VENTOLIN  HFA) 108 (90 Base) MCG/ACT inhaler Inhale 2 puffs into the lungs every 2 (two) hours as needed for wheezing or shortness of breath. 07/16/23   Del Abron Abt, FNP  calcitRIOL  (ROCALTROL ) 0.25 MCG capsule Take 0.25 mcg by mouth 3 (three) times a week.    [provider]  carvedilol  (COREG ) 12.5 MG tablet Take 12.5 mg by mouth 2 (two) times daily with a meal.    [provider]  Continuous Glucose Sensor (DEXCOM G7 SENSOR) MISC Patient is to change the sensor every 10 days to monitor her blood sugars as directed by the provider. 12/06/23   [name], Gebreselassie W, MD  furosemide  (LASIX ) 40 MG tablet Take 1 tablet (40 mg total) by mouth daily. 07/25/23 07/24/24  Justina Oman, MD  hydrALAZINE  (APRESOLINE ) 100 MG tablet Take 1 tablet (100 mg total) by mouth 3 (three) times daily. 10/16/23   Towanda Fret, MD  Insulin  Lispro Prot & Lispro (HUMALOG  MIX 75/25 KWIKPEN) (75-25) 100 UNIT/ML Kwikpen Inject 10 Units into the skin 2 (two) times daily before a  meal. 12/05/23   [name], Jaynee Meyer, MD  NIFEdipine  (ADALAT  CC) 60 MG 24 hr tablet Take 1 tablet (60 mg total) by mouth daily. 01/15/24   Laurann Pollock, MD  potassium chloride  SA (KLOR-CON  M) 20 MEQ tablet Take 20 mEq by mouth daily.    [provider]  rosuvastatin  (CRESTOR ) 5 MG tablet TAKE 1 TABLET (5 MG TOTAL) BY MOUTH DAILY. 11/06/23   Towanda Fret, MD      Allergies    Amlodipine , Benadryl [diphenhydramine hcl], Citalopram, Farxiga [dapagliflozin], Metformin  and related, Tramadol , and Crestor  [rosuvastatin ]    Review of Systems   Review of Systems  Physical Exam Updated Vital Signs BP (!) 169/61   Pulse 61   Ht 5' (1.524 m)   Wt 50 kg   SpO2 98%   BMI 21.53 kg/m  Physical Exam Vitals and nursing note reviewed.  Constitutional:      General: She is not in acute distress.    Appearance: She is well-developed.  HENT:     Head: Normocephalic and atraumatic.     Mouth/Throat:     Mouth: Mucous membranes are moist.  Eyes:     General: Vision grossly intact. Gaze aligned appropriately.     Extraocular Movements: Extraocular movements intact.     Conjunctiva/sclera: Conjunctivae normal.  Cardiovascular:     Rate and Rhythm: Normal rate and regular rhythm.  Pulses: Normal pulses.     Heart sounds: Normal heart sounds, S1 normal and S2 normal. No murmur heard.    No friction rub. No gallop.  Pulmonary:     Effort: Pulmonary effort is normal. No respiratory distress.     Breath sounds: Normal breath sounds.  Abdominal:     General: Bowel sounds are normal.     Palpations: Abdomen is soft.     Tenderness: There is no abdominal tenderness. There is no guarding or rebound.     Hernia: No hernia is present.  Musculoskeletal:        General: No swelling.     Right hand: Swelling present. Normal range of motion.     Cervical back: Full passive range of motion without pain, normal range of motion and neck supple. No spinous process tenderness or muscular  tenderness. Normal range of motion.     Right lower leg: No edema.     Left lower leg: No edema.  Skin:    General: Skin is warm and dry.     Capillary Refill: Capillary refill takes less than 2 seconds.     Findings: No ecchymosis, erythema, rash or wound.  Neurological:     General: No focal deficit present.     Mental Status: She is alert and oriented to person, place, and time.     GCS: GCS eye subscore is 4. GCS verbal subscore is 5. GCS motor subscore is 6.     Cranial Nerves: Cranial nerves 2-12 are intact.     Sensory: Sensation is intact.     Motor: Motor function is intact.     Coordination: Coordination is intact.  Psychiatric:        Attention and Perception: Attention normal.        Mood and Affect: Mood normal.        Speech: Speech normal.        Behavior: Behavior normal.     ED Results / Procedures / Treatments   Labs (all labs ordered are listed, but only abnormal results are displayed) Labs Reviewed  CBC WITH DIFFERENTIAL/PLATELET - Abnormal; Notable for the following components:      Result Value   RBC 2.91 (*)    Hemoglobin 9.4 (*)    HCT 26.1 (*)    All other components within normal limits  BASIC METABOLIC PANEL WITH GFR - Abnormal; Notable for the following components:   Sodium 123 (*)    Potassium 3.0 (*)    Chloride 84 (*)    Glucose, Bld 356 (*)    BUN 70 (*)    Creatinine, Ser 3.70 (*)    Calcium  8.5 (*)    GFR, Estimated 11 (*)    All other components within normal limits  URINALYSIS, ROUTINE W REFLEX MICROSCOPIC - Abnormal; Notable for the following components:   Color, Urine STRAW (*)    Glucose, UA >=500 (*)    Protein, ur >=300 (*)    All other components within normal limits  CBG MONITORING, ED - Abnormal; Notable for the following components:   Glucose-Capillary 390 (*)    All other components within normal limits  MAGNESIUM    EKG None  Radiology No results found.  Procedures Procedures    Medications Ordered in  ED Medications  sodium chloride  0.9 % bolus 500 mL (500 mLs Intravenous New Bag/Given 01/22/24 0153)  insulin  aspart (novoLOG ) injection 10 Units (10 Units Subcutaneous Given 01/22/24 0211)    ED Course/ Medical  Decision Making/ A&P                                 Medical Decision Making Amount and/or Complexity of Data Reviewed Labs: ordered.  Risk Prescription drug management.   Patient presents to the emergency department for evaluation of elevated blood sugars.  She does have history of insulin -dependent diabetes.  Reviewing records reveal some changes made by endocrinology in March.  Unclear exactly how long her sugars have been running high.  Patient with hyperglycemia, no evidence of hyperosmolar state or DKA.  Given IV fluids and insulin .  She appears well.  Patient has a history of chronic renal insufficiency.  She had labs performed in March that revealed her baseline.  Creatinine is significantly elevated today.  She has persistent proteinuria.  Will ask hospitalist to admit for management of acute kidney injury and uncontrolled diabetes.        Final Clinical Impression(s) / ED Diagnoses Final diagnoses:  Hyperglycemia  AKI (acute kidney injury) [facility])    Rx / DC Orders ED Discharge Orders     None         Jasyn Mey, Marine Sia, MD 01/22/24 [phone]

## 2024-01-22 NOTE — ED Notes (Signed)
 Pt to bathroom with via wheelchair and assistance of nurse

## 2024-01-22 NOTE — Plan of Care (Signed)
   Problem: Education: Goal: Knowledge of General Education information will improve Description: Including pain rating scale, medication(s)/side effects and non-pharmacologic comfort measures Outcome: Progressing   Problem: Health Behavior/Discharge Planning: Goal: Ability to manage health-related needs will improve Outcome: Progressing   Problem: Clinical Measurements: Goal: Ability to maintain clinical measurements within normal limits will improve Outcome: Progressing Goal: Will remain free from infection Outcome: Progressing Goal: Diagnostic test results will improve Outcome: Progressing Goal: Respiratory complications will improve Outcome: Progressing Goal: Cardiovascular complication will be avoided Outcome: Progressing   Problem: Activity: Goal: Risk for activity intolerance will decrease Outcome: Progressing   Problem: Nutrition: Goal: Adequate nutrition will be maintained Outcome: Progressing   Problem: Coping: Goal: Level of anxiety will decrease Outcome: Progressing   Problem: Elimination: Goal: Will not experience complications related to bowel motility Outcome: Progressing Goal: Will not experience complications related to urinary retention Outcome: Progressing   Problem: Pain Managment: Goal: General experience of comfort will improve and/or be controlled Outcome: Progressing   Problem: Safety: Goal: Ability to remain free from injury will improve Outcome: Progressing   Problem: Skin Integrity: Goal: Risk for impaired skin integrity will decrease Outcome: Progressing   Problem: Education: Goal: Ability to describe self-care measures that may prevent or decrease complications (Diabetes Survival Skills Education) will improve Outcome: Progressing Goal: Individualized Educational Video(s) Outcome: Progressing   Problem: Coping: Goal: Ability to adjust to condition or change in health will improve Outcome: Progressing   Problem: Fluid  Volume: Goal: Ability to maintain a balanced intake and output will improve Outcome: Progressing   Problem: Health Behavior/Discharge Planning: Goal: Ability to identify and utilize available resources and services will improve Outcome: Progressing Goal: Ability to manage health-related needs will improve Outcome: Progressing   Problem: Metabolic: Goal: Ability to maintain appropriate glucose levels will improve Outcome: Progressing   Problem: Nutritional: Goal: Maintenance of adequate nutrition will improve Outcome: Progressing Goal: Progress toward achieving an optimal weight will improve Outcome: Progressing   Problem: Skin Integrity: Goal: Risk for impaired skin integrity will decrease Outcome: Progressing   Problem: Tissue Perfusion: Goal: Adequacy of tissue perfusion will improve Outcome: Progressing   Problem: Education: Goal: Ability to describe self-care measures that may prevent or decrease complications (Diabetes Survival Skills Education) will improve Outcome: Progressing Goal: Individualized Educational Video(s) Outcome: Progressing   Problem: Coping: Goal: Ability to adjust to condition or change in health will improve Outcome: Progressing   Problem: Fluid Volume: Goal: Ability to maintain a balanced intake and output will improve Outcome: Progressing   Problem: Health Behavior/Discharge Planning: Goal: Ability to identify and utilize available resources and services will improve Outcome: Progressing Goal: Ability to manage health-related needs will improve Outcome: Progressing   Problem: Metabolic: Goal: Ability to maintain appropriate glucose levels will improve Outcome: Progressing   Problem: Nutritional: Goal: Maintenance of adequate nutrition will improve Outcome: Progressing Goal: Progress toward achieving an optimal weight will improve Outcome: Progressing   Problem: Skin Integrity: Goal: Risk for impaired skin integrity will  decrease Outcome: Progressing   Problem: Tissue Perfusion: Goal: Adequacy of tissue perfusion will improve Outcome: Progressing

## 2024-01-22 NOTE — Progress Notes (Signed)
 Patient taken to ICU to be placed on bear hugger for low temps. Report given to Suly,RN

## 2024-01-22 NOTE — Progress Notes (Addendum)
 Emily Jennings is an 89 y.o. female with medical history significant of hypertension, T2DM, CHF, CAD, CKD stage IV who presents to the emergency department due to elevated blood sugar within last 2 to 3 days. She states that she has been compliant with her insulin  as prescribed.  Patient was admitted with hyperglycemia in the setting of type 2 diabetes as well as AKI on CKD stage V.  She was also noted to be hypothermic and hyponatremic.  She has been admitted after midnight and seen and evaluated at bedside.  Blood pressures are starting to elevate and hypothermia is improving with use of Bair hugger.  TSH within normal limits.  Home antihypertensives have been initiated and she continues to remain on IV normal saline to assist with correction of sodium levels and potassium repletion has been ordered.  Urinalysis unremarkable and chest x-ray pending along with flu and COVID studies.  No other acute symptoms identified this morning.  She had developed some hypoglycemia this a.m. and insulin  dosages have been corrected to reflect that.  Diabetes coordinator consulted for assistance in management.  A.m. labs ordered.  Later in the day, patient developing uncontrolled hypertension despite IV hydralazine  pushes and use of home medications and is now noted to have hypertensive crisis requiring Cardene  drip.  Total care time: 40 minutes.

## 2024-01-22 NOTE — Plan of Care (Signed)

## 2024-01-22 NOTE — Progress Notes (Signed)
 Hypoglycemic Event  CBG: 58  Treatment: 8 oz juice/soda  Symptoms: None  Follow-up CBG: Time: 0815 CBG Result: 88  Possible Reasons for Event: Inadequate meal intake  Comments/MD notified: Dr. Leah Primus

## 2024-01-22 NOTE — Progress Notes (Signed)
   01/22/24 0443  Assess: MEWS Score  Temp (!) 94.7 F (34.8 C)  Assess: MEWS Score  MEWS Temp 2  MEWS Systolic 0  MEWS Pulse 0  MEWS RR 0  MEWS LOC 0  MEWS Score 2  MEWS Score Color Yellow  Assess: if the MEWS score is Yellow or Red  Were vital signs accurate and taken at a resting state? Yes  Does the patient meet 2 or more of the SIRS criteria? No  MEWS guidelines implemented  Yes, yellow  Treat  MEWS Interventions Considered administering scheduled or prn medications/treatments as ordered  Take Vital Signs  Increase Vital Sign Frequency  Yellow: Q2hr x1, continue Q4hrs until patient remains green for 12hrs  Escalate  MEWS: Escalate Yellow: Discuss with charge nurse and consider notifying provider and/or RRT  Notify: Charge Nurse/RN  Name of Charge Nurse/RN Notified [facility]  Provider Notification  Provider Name/Title Dr. Elyse Hand  Date Provider Notified 01/22/24  Time Provider Notified [phone]  Method of Notification Page  Notification Reason Other (Comment) (yellow mews)  Provider response No new orders  Date of Provider Response 01/22/24  Time of Provider Response 0445  Assess: SIRS CRITERIA  SIRS Temperature  1  SIRS Respirations  0  SIRS Pulse 0  SIRS WBC 0  SIRS Score Sum  1

## 2024-01-23 DIAGNOSIS — Z794 Long term (current) use of insulin: Secondary | ICD-10-CM | POA: Diagnosis not present

## 2024-01-23 DIAGNOSIS — T68XXXD Hypothermia, subsequent encounter: Secondary | ICD-10-CM

## 2024-01-23 DIAGNOSIS — E1165 Type 2 diabetes mellitus with hyperglycemia: Secondary | ICD-10-CM | POA: Diagnosis not present

## 2024-01-23 LAB — COMPREHENSIVE METABOLIC PANEL WITH GFR
ALT: 12 U/L (ref 0–44)
AST: 19 U/L (ref 15–41)
Albumin: 2.6 g/dL — ABNORMAL LOW (ref 3.5–5.0)
Alkaline Phosphatase: 80 U/L (ref 38–126)
Anion gap: 10 (ref 5–15)
BUN: 63 mg/dL — ABNORMAL HIGH (ref 8–23)
CO2: 26 mmol/L (ref 22–32)
Calcium: 8.4 mg/dL — ABNORMAL LOW (ref 8.9–10.3)
Chloride: 94 mmol/L — ABNORMAL LOW (ref 98–111)
Creatinine, Ser: 3.29 mg/dL — ABNORMAL HIGH (ref 0.44–1.00)
GFR, Estimated: 13 mL/min — ABNORMAL LOW (ref >60)
Glucose, Bld: 227 mg/dL — ABNORMAL HIGH (ref 70–99)
Potassium: 3.6 mmol/L (ref 3.5–5.1)
Sodium: 130 mmol/L — ABNORMAL LOW (ref 135–145)
Total Bilirubin: 0.4 mg/dL (ref 0.0–1.2)
Total Protein: 4.9 g/dL — ABNORMAL LOW (ref 6.5–8.1)

## 2024-01-23 LAB — GLUCOSE, CAPILLARY
Glucose-Capillary: 169 mg/dL — ABNORMAL HIGH (ref 70–99)
Glucose-Capillary: 211 mg/dL — ABNORMAL HIGH (ref 70–99)
Glucose-Capillary: 379 mg/dL — ABNORMAL HIGH (ref 70–99)
Glucose-Capillary: 279 mg/dL — ABNORMAL HIGH (ref 70–99)

## 2024-01-23 LAB — CBC
HCT: 29 % — ABNORMAL LOW (ref 36.0–46.0)
Hemoglobin: 10 g/dL — ABNORMAL LOW (ref 12.0–15.0)
MCH: 31.7 pg (ref 26.0–34.0)
MCHC: 34.5 g/dL (ref 30.0–36.0)
MCV: 92.1 fL (ref 80.0–100.0)
Platelets: 245 10*3/uL (ref 150–400)
RBC: 3.15 MIL/uL — ABNORMAL LOW (ref 3.87–5.11)
RDW: 13.3 % (ref 11.5–15.5)
WBC: 5.9 10*3/uL (ref 4.0–10.5)
nRBC: 0 % (ref 0.0–0.2)

## 2024-01-23 LAB — PHOSPHORUS: Phosphorus: 3.5 mg/dL (ref 2.5–4.6)

## 2024-01-23 LAB — MAGNESIUM: Magnesium: 2.3 mg/dL (ref 1.7–2.4)

## 2024-01-23 MED ORDER — SODIUM CHLORIDE 0.9 % IV SOLN: INTRAVENOUS | Status: DC

## 2024-01-23 MED ORDER — CARVEDILOL 12.5 MG PO TABS: 25.0000 mg | ORAL_TABLET | Freq: Two times a day (BID) | ORAL | Status: DC

## 2024-01-23 MED ORDER — SODIUM CHLORIDE 0.9 % IV SOLN: INTRAVENOUS | Status: AC

## 2024-01-23 MED ORDER — INSULIN ASPART PROT & ASPART (70-30 MIX) 100 UNIT/ML ~~LOC~~ SUSP
14.0000 [IU] | Freq: Two times a day (BID) | SUBCUTANEOUS | Status: DC
Start: 2024-01-23 — End: 2024-01-24
  Administered 2024-01-23: 14 [IU] via SUBCUTANEOUS
  Filled 2024-01-23: qty 10

## 2024-01-23 MED ORDER — LABETALOL HCL 200 MG PO TABS
200.0000 mg | ORAL_TABLET | Freq: Two times a day (BID) | ORAL | Status: DC
  Administered 2024-01-23 (×2): 200 mg via ORAL
  Filled 2024-01-23 (×3): qty 1

## 2024-01-23 NOTE — TOC Initial Note (Signed)
 Transition of Care [facility]) - Initial/Assessment Note    Patient Details  Name: Emily Jennings MRN: [id number] Date of Birth: 1934-08-14  [facility] CM/SW Contact:    Ander Katos, LCSW Phone Number: 01/23/2024, 8:21 AM  Clinical Narrative: Pt admitted for hyperglycemia. Assessment completed due to high risk readmission score. Pt reports she lives with her husband and son who assist with ADLs. She ambulates with a cane at baseline. No home health reported prior to admission. Pt's husband provides transportation to appointments. Pt plans to return home when medically stable. TOC will continue to follow.                   Expected Discharge Plan: Home/Self Care Barriers to Discharge: Continued Medical Work up   Patient Goals and CMS Choice Patient states their goals for this hospitalization and ongoing recovery are:: return home   Choice offered to / list presented to : Patient [place] ownership interest in [facility].provided to::  (n/a)    Expected Discharge Plan and Services In-house Referral: Clinical Social Work     Living arrangements for the past 2 months: Single Family Home                                      Prior Living Arrangements/Services Living arrangements for the past 2 months: Single Family Home Lives with:: Spouse, Adult Children Patient language and need for interpreter reviewed:: Yes Do you feel safe going back to the place where you live?: Yes        Care giver support system in place?: Yes (comment) Current home services: DME (cane, walker) Criminal Activity/Legal Involvement Pertinent to Current Situation/Hospitalization: No - Comment as needed  Activities of Daily Living   ADL Screening (condition at time of admission) Independently performs ADLs?: No Does the patient have a NEW difficulty with bathing/dressing/toileting/self-feeding that is expected to last >3 days?: No Does the patient have a NEW  difficulty with getting in/out of bed, walking, or climbing stairs that is expected to last >3 days?: No Does the patient have a NEW difficulty with communication that is expected to last >3 days?: No Is the patient deaf or have difficulty hearing?: No Does the patient have difficulty seeing, even when wearing glasses/contacts?: No Does the patient have difficulty concentrating, remembering, or making decisions?: No  Permission Sought/Granted                  Emotional Assessment     Affect (typically observed): Appropriate Orientation: : Oriented to Self, Oriented to Place, Oriented to  Time, Oriented to Situation Alcohol / Substance Use: Not Applicable Psych Involvement: No (comment)  Admission diagnosis:  Hyperglycemia [R73.9] Type 2 diabetes mellitus with hyperglycemia (HCC) [E11.65] AKI (acute kidney injury) (HCC) [N17.9] Patient Active Problem List   Diagnosis Date Noted   Type 2 diabetes mellitus with hyperglycemia (HCC) 01/22/2024   Insulin  long-term use (HCC) 12/05/2023   Type 2 diabetes mellitus with other specified complication (HCC) 09/25/2023   Acute kidney injury superimposed on stage 4 chronic kidney disease (HCC) 08/14/2023   Small bowel obstruction (HCC) 07/19/2023   Ischemic bowel disease (HCC) 07/19/2023   Incarcerated left inguinal hernia 07/18/2023   Acute kidney injury superimposed on chronic kidney disease (HCC) 07/18/2023   Hypoglycemia 07/18/2023   Uncontrolled type 2 diabetes mellitus with hypoglycemia, with long-term current use of insulin  (HCC) 07/04/2023  Dysphagia 07/04/2023   HCAP (healthcare-associated pneumonia) 07/03/2023   Essential hypertension 06/15/2023   Acute on chronic renal failure (HCC) 06/02/2023   Acute diastolic CHF (congestive heart failure) (HCC) 06/01/2023   Cerebellar edema (HCC) 05/23/2023   Mixed hyperlipidemia 05/23/2023   DNR (do not resuscitate) 05/17/2023   Vocal cord dysfunction 05/17/2023   DNR (do not  resuscitate) discussion 05/17/2023   Goals of care, counseling/discussion 05/17/2023   Right cerebellar intraparenchymal hemorrhage due to hypertension 05/16/2023   Unsteady gait when walking 04/03/2023   Hospital discharge follow-up 03/06/2023   Bilateral kidney stones 03/06/2023   Hip swelling, right 02/27/2023   DKA (diabetic ketoacidosis) (HCC) 02/17/2023   High anion gap metabolic acidosis 02/17/2023   Pseudohyponatremia 02/17/2023   Hypoalbuminemia due to protein-calorie malnutrition (HCC) 02/17/2023   Dehydration 02/17/2023   Hip pain 02/15/2023   Encounter for Medicare annual examination with abnormal findings 10/16/2022   Vitamin D  deficiency 10/16/2022   Acute exacerbation of CHF (congestive heart failure) (HCC) 09/13/2022   Acute on chronic congestive heart failure with left ventricular diastolic dysfunction (HCC) 09/13/2022   Chronic kidney disease (CKD) stage G3b/A2, moderately decreased glomerular filtration rate (GFR) between 30-44 mL/min/1.73 square meter and albuminuria creatinine ratio between 30-299 mg/g (HCC) 09/13/2022   Mild aortic valve stenosis 09/13/2022   Dyspnea on exertion 09/05/2022   CHF (congestive heart failure) (HCC) 09/05/2022   Acute on chronic diastolic (congestive) heart failure (HCC) 09/05/2022   Hyperkalemia 04/09/2022   Hyperglycemic crisis due to diabetes mellitus (HCC) 03/25/2022   Hyponatremia 03/25/2022   Acute metabolic encephalopathy 03/22/2022   Hypothermia 03/22/2022   Heart murmur 03/22/2022   Bilateral lower extremity edema 03/22/2022   Syncope and collapse 01/13/2021   Lip swelling 10/30/2020   CKD stage 4 due to type 2 diabetes mellitus (HCC) 10/30/2020   Tubular adenoma of colon 10/26/2017   Diabetes (HCC) 07/27/2016   Osteoporosis 07/28/2014   Diabetic hypertension-nephrosis syndrome (HCC) 03/23/2014   Type 2 diabetes mellitus with stage 3a chronic kidney disease, with long-term current use of insulin  (HCC) 09/29/2013    Allergic rhinitis 01/27/2013   GERD (gastroesophageal reflux disease) 01/12/2013   CVD (cardiovascular disease) 07/17/2011   Hypokalemia 08/31/2008   Hyperlipidemia LDL goal <100 07/15/2007   Resistant hypertension 07/15/2007   PCP:  Towanda Fret, MD Pharmacy:   [id number] - [place], [place] - [address] [facility] [address] [place] [address] Phone: [phone] Fax: [phone]  [facility] - [place], [place] - [address] [facility] [address] [place] [address] [place]  [address] Phone: [phone] Fax: [phone]  [facility] [address] [place] [address] Phone: [phone] Fax: [phone]     Social Drivers of Health (SDOH) Social History: SDOH Screenings   Food Insecurity: No Food Insecurity (01/22/2024)  Housing: Low Risk  (01/22/2024)  Transportation Needs: No Transportation Needs (01/22/2024)  Utilities: Not At Risk (01/22/2024)  Alcohol Screen: Low Risk  (11/15/2022)  Depression (PHQ2-9): Low Risk  (10/24/2023)  Financial Resource Strain: Low Risk  (12/28/2022)  Physical Activity: Sufficiently Active (11/15/2022)  Social Connections: Socially Integrated (01/22/2024)  Stress: No Stress Concern Present (12/28/2022)  Tobacco Use: Low Risk  (01/22/2024)   SDOH Interventions:     Readmission Risk Interventions    01/23/2024    8:19 AM 07/19/2023    2:27 PM 07/04/2023    8:27 AM  Readmission Risk Prevention Plan  Transportation Screening Complete Complete Complete  HRI or Home Care Consult Complete    Social Work Consult for Recovery Care Planning/Counseling Complete    Palliative Care Screening Not Applicable    Medication Review Oceanographer) Complete Complete Complete  PCP or Specialist appointment within 3-5 days of discharge  Not Complete   HRI or Home Care Consult  Complete Complete  SW Recovery Care/Counseling Consult  Complete Complete  Palliative Care  Screening  Not Applicable Not Applicable  Skilled Nursing Facility  Not Applicable Not Applicable

## 2024-01-23 NOTE — Hospital Course (Addendum)
  Emily Jennings is an 89 y.o. female with medical history significant of hypertension, T2DM, CHF, CAD, CKD stage IV who presents to the emergency department due to elevated blood sugar within last 2 to 3 days. She states that she has been compliant with her insulin  as prescribed.  Patient was admitted with hyperglycemia in the setting of type 2 diabetes as well as AKI on CKD stage V.  She was also noted to be hypothermic and hyponatremic ED Course:  BP was 152/117, temperature 94.12F.  CBC showed normocytic anemia, BMP showed sodium of 123, potassium 3.0, chloride 84, bicarb 26, blood glucose 356, BUN 78, creatinine 3.70, GFR 11.  Urinalysis was positive for glycosuria, magnesium  2.3 Patient was treated with 10 units of NovoLog , IV hydration was provided.     Assessment & Plan:   Principal Problem:   Type 2 diabetes mellitus with hyperglycemia (HCC) Active Problems:   Acute kidney injury superimposed on chronic kidney disease (HCC)   Hyponatremia   Hypothermia   Hypokalemia   Mixed hyperlipidemia   Essential hypertension   Type 2 diabetes mellitus with hyperglycemia CBG (last 3)  Recent Labs    01/24/24 2036 01/25/24 0744 01/25/24 0832  GLUCAP 79 199* 157*  - Hyperglycemia overnight CBG as low as 24-questionable accuracy patient was asymptomatic  Semglee  discontinued - Starting NovoLog  70/30 14 >> 16 >>> reducing to 10 units twice daily  and adjust dose accordingly Continue ISS and hypoglycemia protocol   Acute kidney injury superimposed on CKD stage V -Baseline creatinine 2.3-2.9 - Mild volume overload -Improving BUN/creatinine -Nephrology consulted, initiating IV Lasix  40 twice daily 0 replacing albumin  -Monitoring I's and O's  - POA: BUN/creatinine 70/3.70 (baseline creatinine 2.3-2.4) - Holding nephrotoxic,d Lab Results  Component Value Date   CREATININE 2.93 (H) 01/25/2024   CREATININE 3.09 (H) 01/24/2024   CREATININE 3.29 (H) 01/23/2024    Hypothermia  -resolved-  TSH normal at 2.6, improved glycemic control   Hypokalemia -monitoring and replacing, checking magnesium  level   Hyponatremia, hypovolemia -Improving Na 123, corrected sodium level based on hyperglycemia (350) is 127 Serum osmolality 290, -Serum sodium 123->>>> 130, 130 >> 134  - Continue with IV Lasix  per nephrology recommendations  Accelerated hypertension Essential hypertension - Elevated BP continue home medication of hydralazine   - switch Coreg  to labetalol  --- titrate dose up Nifedipine  and Lasix  held at this time due to acute kidney injury   Mixed hyperlipidemia Continue Crestor    Diastolic CHF (congestive heart failure) -preserved ejection fraction 40 grade 1 diastolic dysfunction Continue total input/output, daily weights  H/o Diastolic CHF.  Stable and compensated.   Last echo 04/2023 EF of 65 to 70% grade 2 DD.

## 2024-01-23 NOTE — Inpatient Diabetes Management (Addendum)
 Inpatient Diabetes Program Recommendations  AACE/ADA: New Consensus Statement on Inpatient Glycemic Control  Target Ranges:  Prepandial:   less than 140 mg/dL      Peak postprandial:   less than 180 mg/dL (1-2 hours)      Critically ill patients:  140 - 180 mg/dL    Latest Reference Range & Units 01/22/24 08:14 01/22/24 11:43 01/22/24 16:54 01/22/24 21:23 01/23/24 07:09  Glucose-Capillary 70 - 99 mg/dL 88 098 (H) 119 (H)  Novolog  1 unit 292 (H)  Novolog  3 units  Semglee  5 units 169 (H)  Novolog  1 unit    Latest Reference Range & Units 01/22/24 00:45 01/22/24 02:11 01/22/24 03:51 01/22/24 04:39 01/22/24 07:57  Glucose-Capillary 70 - 99 mg/dL 147 (H)   Novolog  10 units 211 (H) 141 (H) 58 (L)   Review of Glycemic Control  Diabetes history: DM2 Outpatient Diabetes medications: Humalog  75/25 10 units BID; Dexcom G7 CGM Current orders for Inpatient glycemic control: Semglee  5 units at bedtime, Novolog  0-6 units TID with meals, Novolog  0-5 units QHS  Inpatient Diabetes Program Recommendations:    Insulin : Agree with current insulin  orders.  Outpatient DM: Patient reports that since she was switched to Humalog  75/25 10 units BID on 12/05/23, her glucose has been running much higher. Patient would like to be discharged on Tresiba  at bedtime and Humalog  TID with meals. If she is discharged on Tresiba  and Humalog , she would need Rx for Tresiba  pens (#[id number]), Humalog  pens (#[id number]), and insulin  pen needles [phone]).  NOTE: In reviewing the chart, noted patient sees Dr. Monte Antonio and was seen on 12/05/23. Per office note on 12/05/23, patient had been using Tresiba  6 units daily and Humalog  5 units with meals but it was too complicated of a regimen for patient so was switched to Humalog  75/25 10 units BID. Also noted that patient uses Dexcom G7 CGM;  "14% time in range, 20% level 1 hyperglycemia, 61% level 2 hyperglycemia. She does have 5% Level One hypoglycemia, <1% level 2 hypoglycemia."   Will  plan to speak with patient today.  Addendum 01/23/24@11 :00-Spoke with patient at bedside. Patient is hard of hearing. Patient confirms that she is seeing Dr. Monte Antonio and that she has been taking Humalog  75/25 10 units BID. Patient reports that she had been taking Tresiba  and Humalog  with meals but Dr. [name] switched her to 75/25 at last visit. Patient states that her glucose was much better controlled when she was taking the Tresiba  and Humalog  insulin . She reports that since she started taking the 75/25 10 units BID her glucose has ranged from 200-400's mg/dl. Discussed that per Dr. Chrystine Crate note, her insulin  was switched because the 4 injections a day (with Tresiba  and Humalog  TID) was too complicated. Patient states that the other insulin  regimen was not too hard for her and that she would rather use the Tresiba  at night and the Humalog  TID with meals because it done a better job with DM control. Patient reports that she has an appointment with Dr. Monte Antonio on May 9th and she plans to ask him to put her back on the Tresiba  and Humalog . Patient states she is using the Dexcom G7 CGM sensor and her glucose is much higher than usual for her.  Patient states she only has Humalog  75/25 insulin  at home but she would like to be discharged on Tresiba  and Humalog . Informed patient that I would let attending provider know she would like to stop the Humalog  75/25 and use Tresiba  once at night and  Humalog  TID with meals. Asked patient if she could tell when her sugar was down to 58 mg/dl yesterday morning and patient states she could not tell and did not have any symptoms of hypoglycemia that she was aware of at the time.  Patient verbalized understanding and has no questions at this time.  Thanks, Beacher Limerick, RN, MSN, CDCES Diabetes Coordinator Inpatient Diabetes Program [phone] (Team Pager from 8am to 5pm)

## 2024-01-23 NOTE — Progress Notes (Addendum)
 PROGRESS NOTE    Patient: Emily Jennings                            PCP: Towanda Fret, MD                    DOB: November 10, 1933            DOA: 01/22/2024 RUE:[id number]             DOS: 01/23/2024, 1:10 PM   LOS: 1 day   Date of Service: The patient was seen and examined on 01/23/2024  Subjective:   The patient was seen and examined this morning. Awake alert oriented in no acute distress, hypertensive, satting 100% room air,  Brief Narrative:    Emily Jennings is an 89 y.o. female with medical history significant of hypertension, T2DM, CHF, CAD, CKD stage IV who presents to the emergency department due to elevated blood sugar within last 2 to 3 days. She states that she has been compliant with her insulin  as prescribed.  Patient was admitted with hyperglycemia in the setting of type 2 diabetes as well as AKI on CKD stage V.  She was also noted to be hypothermic and hyponatremic ED Course:  BP was 152/117, temperature 94.87F.  CBC showed normocytic anemia, BMP showed sodium of 123, potassium 3.0, chloride 84, bicarb 26, blood glucose 356, BUN 78, creatinine 3.70, GFR 11.  Urinalysis was positive for glycosuria, magnesium 2.3 Patient was treated with 10 units of NovoLog , IV hydration was provided.     Assessment & Plan:   Principal Problem:   Type 2 diabetes mellitus with hyperglycemia (HCC) Active Problems:   Acute kidney injury superimposed on chronic kidney disease (HCC)   Hyponatremia   Hypothermia   Hypokalemia   Mixed hyperlipidemia   Essential hypertension   Type 2 diabetes mellitus with hyperglycemia CBG (last 3)  Recent Labs    01/22/24 2123 01/23/24 0709 01/23/24 1107  GLUCAP 292* 169* 211*   Semglee  discontinued - Starting NovoLog  70/30 14 units twice daily  units nightly and adjust dose accordingly Continue ISS and hypoglycemia protocol   Acute kidney injury superimposed on CKD stage V POA: BUN/creatinine 70/3.70 (baseline creatinine 2.3-2.4) - Holding  nephrotoxic, gentle IVF Lab Results  Component Value Date   CREATININE 3.29 (H) 01/23/2024   CREATININE 3.70 (H) 01/22/2024   CREATININE 2.31 (H) 11/28/2023     Hypothermia -improving, TSH normal at 2.6, improved glycemic control   Hypokalemia - replaced, POA K3 0.3-3.6   Hyponatremia Na 123, corrected sodium level based on hyperglycemia (350) is 127 Serum osmolality 290, -Serum sodium 123->>>> 139   Essential hypertension - Elevated BP continue home medication of hydralazine  switch Coreg  to labetalol  Nifedipine  and Lasix  held at this time due to acute kidney injury   Mixed hyperlipidemia Continue Crestor    Diastolic CHF (congestive heart failure)  Continue total input/output, daily weights  H/o Diastolic CHF.  Stable and compensated.   Last echo 04/2023 EF of 65 to 70% grade 2 DD.    ----------------------------------------------------------------------------------------------------------------------------------------------- Nutritional status:  The patient's BMI is: Body mass index is 22.52 kg/m. I agree with the assessment and plan as outlined -----------------------------------------------------------------------------------------------------------------  DVT prophylaxis:  heparin  injection 5,000 Units Start: 01/22/24 0845 SCDs Start: 01/22/24 0746   Code Status:   Code Status: Full Code  Family Communication: No family member present at bedside- attempt will be made to update daily  -  Advance care planning has been discussed.   Admission status:   Status is: Inpatient Remains inpatient appropriate because: Needing IV fluids, electrolyte replacement   Disposition: From  - home             Planning for discharge in 1-2 days: to home with home health  Procedures:   No admission procedures for hospital encounter.   Antimicrobials:  Anti-infectives (From admission, onward)    None        Medication:   Chlorhexidine  Gluconate Cloth  6 each Topical  Daily   heparin   5,000 Units Subcutaneous Q8H   hydrALAZINE   100 mg Oral TID   insulin  aspart  0-5 Units Subcutaneous QHS   insulin  aspart  0-6 Units Subcutaneous TID WC   insulin  aspart protamine- aspart  14 Units Subcutaneous BID WC   labetalol   200 mg Oral BID   rosuvastatin   5 mg Oral Daily    acetaminophen  **OR** acetaminophen , hydrALAZINE , ondansetron  **OR** ondansetron  (ZOFRAN ) IV   Objective:   Vitals:   01/23/24 1004 01/23/24 1100 01/23/24 1105 01/23/24 1200  BP: (!) 172/72 (!) 128/59  (!) 155/77  Pulse: 80 62  66  Resp:  16 20 13   Temp:   97.6 F (36.4 C)   TempSrc:   Oral   SpO2:  98%  100%  Weight:      Height:        Intake/Output Summary (Last 24 hours) at 01/23/2024 1310 Last data filed at 01/23/2024 1252 Gross per 24 hour  Intake 1582.13 ml  Output 702 ml  Net 880.13 ml   Filed Weights   01/22/24 0044 01/22/24 0420 01/23/24 0500  Weight: 50 kg 52.2 kg 52.3 kg     Physical examination:   Constitution:  Alert, cooperative, no distress,  Appears calm and comfortable  Psychiatric:   Normal and stable mood and affect, cognition intact,   HEENT:        Normocephalic, PERRL, otherwise with in Normal limits  Chest:         Chest symmetric Cardio vascular:  S1/S2, RRR, No murmure, No Rubs or Gallops  pulmonary: Clear to auscultation bilaterally, respirations unlabored, negative wheezes / crackles Abdomen: Soft, non-tender, non-distended, bowel sounds,no masses, no organomegaly Muscular skeletal: Limited exam - in bed, able to move all 4 extremities,   Neuro: CNII-XII intact. , normal motor and sensation, reflexes intact  Extremities: No pitting edema lower extremities, +2 pulses  Skin: Dry, warm to touch, negative for any Rashes, No open wounds Wounds: per nursing documentation   ------------------------------------------------------------------------------------------------------------------------------------------    LABs:     Latest Ref Rng & Units  01/23/2024    3:26 AM 01/22/2024    1:22 AM 07/27/2023    9:15 AM  CBC  WBC 4.0 - 10.5 K/uL 5.9  5.1  7.4   Hemoglobin 12.0 - 15.0 g/dL 60.4  9.4  9.2   Hematocrit 36.0 - 46.0 % 29.0  26.1  28.2   Platelets 150 - 400 K/uL 245  192  256       Latest Ref Rng & Units 01/23/2024    3:26 AM 01/22/2024    1:22 AM 11/28/2023    1:07 PM  CMP  Glucose 70 - 99 mg/dL 540  981  191   BUN 8 - 23 mg/dL 63  70  61   Creatinine 0.44 - 1.00 mg/dL 4.78  2.95  6.21   Sodium 135 - 145 mmol/L 130  123  134  Potassium 3.5 - 5.1 mmol/L 3.6  3.0  3.0   Chloride 98 - 111 mmol/L 94  84  88   CO2 22 - 32 mmol/L 26  26  30    Calcium  8.9 - 10.3 mg/dL 8.4  8.5  9.2   Total Protein 6.5 - 8.1 g/dL 4.9   6.0   Total Bilirubin 0.0 - 1.2 mg/dL 0.4   0.4   Alkaline Phos 38 - 126 U/L 80   88   AST 15 - 41 U/L 19   28   ALT 0 - 44 U/L 12   12        Micro Results Recent Results (from the past 240 hours)  MRSA Next Gen by PCR, Nasal     Status: None   Collection Time: 01/22/24  6:40 AM   Specimen: Nasal Mucosa; Nasal Swab  Result Value Ref Range Status   MRSA by PCR Next Gen NOT DETECTED NOT DETECTED Final    Comment: (NOTE) The GeneXpert MRSA Assay (FDA approved for NASAL specimens only), is one component of a comprehensive MRSA colonization surveillance program. It is not intended to diagnose MRSA infection nor to guide or monitor treatment for MRSA infections. Test performance is not FDA approved in patients less than 19 years old. Performed at [facility], [address] [place], [place] [address]   Resp panel by RT-PCR (RSV, Flu A&B, Covid) Anterior Nasal Swab     Status: None   Collection Time: 01/22/24 12:53 PM   Specimen: Anterior Nasal Swab  Result Value Ref Range Status   SARS Coronavirus 2 by RT PCR NEGATIVE NEGATIVE Final    Comment: (NOTE) SARS-CoV-2 target nucleic acids are NOT DETECTED.  The SARS-CoV-2 RNA is generally detectable in upper respiratory specimens during the acute phase of  infection. The lowest concentration of SARS-CoV-2 viral copies this assay can detect is 138 copies/mL. A negative result does not preclude SARS-Cov-2 infection and should not be used as the sole basis for treatment or other patient management decisions. A negative result may occur with  improper specimen collection/handling, submission of specimen other than nasopharyngeal swab, presence of viral mutation(s) within the areas targeted by this assay, and inadequate number of viral copies(<138 copies/mL). A negative result must be combined with clinical observations, patient history, and epidemiological information. The expected result is Negative.  Fact Sheet for Patients:  BloggerCourse.com  Fact Sheet for Healthcare Providers:  SeriousBroker.it  This test is no t yet approved or cleared by the [place]  FDA and  has been authorized for detection and/or diagnosis of SARS-CoV-2 by FDA under an Emergency Use Authorization (EUA). This EUA will remain  in effect (meaning this test can be used) for the duration of the COVID-19 declaration under Section 564(b)(1) of the Act, 21 U.S.C.section 360bbb-3(b)(1), unless the authorization is terminated  or revoked sooner.       Influenza A by PCR NEGATIVE NEGATIVE Final   Influenza B by PCR NEGATIVE NEGATIVE Final    Comment: (NOTE) The Xpert Xpress SARS-CoV-2/FLU/RSV plus assay is intended as an aid in the diagnosis of influenza from Nasopharyngeal swab specimens and should not be used as a sole basis for treatment. Nasal washings and aspirates are unacceptable for Xpert Xpress SARS-CoV-2/FLU/RSV testing.  Fact Sheet for Patients: BloggerCourse.com  Fact Sheet for Healthcare Providers: SeriousBroker.it  This test is not yet approved or cleared by the [place]  FDA and has been authorized for detection and/or diagnosis of SARS-CoV-2  by FDA under  an Emergency Use Authorization (EUA). This EUA will remain in effect (meaning this test can be used) for the duration of the COVID-19 declaration under Section 564(b)(1) of the Act, 21 U.S.C. section 360bbb-3(b)(1), unless the authorization is terminated or revoked.     Resp Syncytial Virus by PCR NEGATIVE NEGATIVE Final    Comment: (NOTE) Fact Sheet for Patients: BloggerCourse.com  Fact Sheet for Healthcare Providers: SeriousBroker.it  This test is not yet approved or cleared by the [place]  FDA and has been authorized for detection and/or diagnosis of SARS-CoV-2 by FDA under an Emergency Use Authorization (EUA). This EUA will remain in effect (meaning this test can be used) for the duration of the COVID-19 declaration under Section 564(b)(1) of the Act, 21 U.S.C. section 360bbb-3(b)(1), unless the authorization is terminated or revoked.  Performed at [facility], [address] [place], [place] [address]     Radiology Reports No results found.   SIGNED: Bobbetta Burnet, MD, FHM. FAAFP. Arlin Benes - [facility] Time spent - 55 min.  In seeing, evaluating and examining the patient. Reviewing medical records, labs, drawn plan of care. [facility],  Pager (please use [email or web address] to page/ text) Please use Epic Secure Chat for non-urgent communication (7AM-7PM)  If 7PM-7AM, please contact night-coverage [email or web address], 01/23/2024, 1:10 PM

## 2024-01-23 NOTE — Plan of Care (Signed)
   Problem: Education: Goal: Knowledge of General Education information will improve Description: Including pain rating scale, medication(s)/side effects and non-pharmacologic comfort measures Outcome: Progressing   Problem: Health Behavior/Discharge Planning: Goal: Ability to manage health-related needs will improve Outcome: Progressing   Problem: Clinical Measurements: Goal: Ability to maintain clinical measurements within normal limits will improve Outcome: Progressing Goal: Will remain free from infection Outcome: Progressing Goal: Diagnostic test results will improve Outcome: Progressing Goal: Respiratory complications will improve Outcome: Progressing Goal: Cardiovascular complication will be avoided Outcome: Progressing   Problem: Activity: Goal: Risk for activity intolerance will decrease Outcome: Progressing   Problem: Nutrition: Goal: Adequate nutrition will be maintained Outcome: Progressing   Problem: Coping: Goal: Level of anxiety will decrease Outcome: Progressing   Problem: Elimination: Goal: Will not experience complications related to bowel motility Outcome: Progressing Goal: Will not experience complications related to urinary retention Outcome: Progressing   Problem: Pain Managment: Goal: General experience of comfort will improve and/or be controlled Outcome: Progressing   Problem: Safety: Goal: Ability to remain free from injury will improve Outcome: Progressing   Problem: Skin Integrity: Goal: Risk for impaired skin integrity will decrease Outcome: Progressing   Problem: Education: Goal: Ability to describe self-care measures that may prevent or decrease complications (Diabetes Survival Skills Education) will improve Outcome: Progressing Goal: Individualized Educational Video(s) Outcome: Progressing   Problem: Coping: Goal: Ability to adjust to condition or change in health will improve Outcome: Progressing   Problem: Fluid  Volume: Goal: Ability to maintain a balanced intake and output will improve Outcome: Progressing   Problem: Health Behavior/Discharge Planning: Goal: Ability to identify and utilize available resources and services will improve Outcome: Progressing Goal: Ability to manage health-related needs will improve Outcome: Progressing   Problem: Metabolic: Goal: Ability to maintain appropriate glucose levels will improve Outcome: Progressing   Problem: Nutritional: Goal: Maintenance of adequate nutrition will improve Outcome: Progressing Goal: Progress toward achieving an optimal weight will improve Outcome: Progressing   Problem: Skin Integrity: Goal: Risk for impaired skin integrity will decrease Outcome: Progressing   Problem: Tissue Perfusion: Goal: Adequacy of tissue perfusion will improve Outcome: Progressing   Problem: Education: Goal: Ability to describe self-care measures that may prevent or decrease complications (Diabetes Survival Skills Education) will improve Outcome: Progressing Goal: Individualized Educational Video(s) Outcome: Progressing   Problem: Coping: Goal: Ability to adjust to condition or change in health will improve Outcome: Progressing   Problem: Fluid Volume: Goal: Ability to maintain a balanced intake and output will improve Outcome: Progressing   Problem: Health Behavior/Discharge Planning: Goal: Ability to identify and utilize available resources and services will improve Outcome: Progressing Goal: Ability to manage health-related needs will improve Outcome: Progressing   Problem: Metabolic: Goal: Ability to maintain appropriate glucose levels will improve Outcome: Progressing   Problem: Nutritional: Goal: Maintenance of adequate nutrition will improve Outcome: Progressing Goal: Progress toward achieving an optimal weight will improve Outcome: Progressing   Problem: Skin Integrity: Goal: Risk for impaired skin integrity will  decrease Outcome: Progressing   Problem: Tissue Perfusion: Goal: Adequacy of tissue perfusion will improve Outcome: Progressing

## 2024-01-24 ENCOUNTER — Inpatient Hospital Stay (HOSPITAL_COMMUNITY)

## 2024-01-24 ENCOUNTER — Ambulatory Visit: Admitting: "Endocrinology

## 2024-01-24 DIAGNOSIS — I10 Essential (primary) hypertension: Secondary | ICD-10-CM | POA: Diagnosis not present

## 2024-01-24 DIAGNOSIS — Z794 Long term (current) use of insulin: Secondary | ICD-10-CM | POA: Diagnosis not present

## 2024-01-24 DIAGNOSIS — E1165 Type 2 diabetes mellitus with hyperglycemia: Secondary | ICD-10-CM | POA: Diagnosis not present

## 2024-01-24 LAB — BASIC METABOLIC PANEL WITH GFR
Anion gap: 8 (ref 5–15)
BUN: 58 mg/dL — ABNORMAL HIGH (ref 8–23)
CO2: 28 mmol/L (ref 22–32)
Calcium: 8.4 mg/dL — ABNORMAL LOW (ref 8.9–10.3)
Chloride: 94 mmol/L — ABNORMAL LOW (ref 98–111)
Creatinine, Ser: 3.09 mg/dL — ABNORMAL HIGH (ref 0.44–1.00)
GFR, Estimated: 14 mL/min — ABNORMAL LOW (ref >60)
Glucose, Bld: 98 mg/dL (ref 70–99)
Potassium: 3.1 mmol/L — ABNORMAL LOW (ref 3.5–5.1)
Sodium: 130 mmol/L — ABNORMAL LOW (ref 135–145)

## 2024-01-24 LAB — GLUCOSE, CAPILLARY
Glucose-Capillary: 107 mg/dL — ABNORMAL HIGH (ref 70–99)
Glucose-Capillary: 97 mg/dL (ref 70–99)
Glucose-Capillary: 78 mg/dL (ref 70–99)
Glucose-Capillary: 79 mg/dL (ref 70–99)

## 2024-01-24 LAB — MAGNESIUM: Magnesium: 2.1 mg/dL (ref 1.7–2.4)

## 2024-01-24 MED ORDER — POTASSIUM CHLORIDE CRYS ER 20 MEQ PO TBCR
40.0000 meq | EXTENDED_RELEASE_TABLET | Freq: Once | ORAL | Status: AC
Start: 2024-01-24 — End: 2024-01-24
  Administered 2024-01-24: 40 meq via ORAL
  Filled 2024-01-24: qty 2

## 2024-01-24 MED ORDER — PEDIALYTE PO SOLN
1000.0000 mL | Freq: Three times a day (TID) | ORAL | Status: DC
  Administered 2024-01-24 – 2024-01-25 (×2): 1000 mL via ORAL

## 2024-01-24 MED ORDER — LABETALOL HCL 200 MG PO TABS
300.0000 mg | ORAL_TABLET | Freq: Two times a day (BID) | ORAL | Status: DC
  Administered 2024-01-24 – 2024-01-30 (×13): 300 mg via ORAL
  Filled 2024-01-24 (×14): qty 1

## 2024-01-24 MED ORDER — ISOSORBIDE MONONITRATE ER 60 MG PO TB24
30.0000 mg | ORAL_TABLET | Freq: Every day | ORAL | Status: DC
  Administered 2024-01-24: 30 mg via ORAL
  Filled 2024-01-24 (×2): qty 1

## 2024-01-24 MED ORDER — ISOSORBIDE MONONITRATE ER 60 MG PO TB24
60.0000 mg | ORAL_TABLET | Freq: Every day | ORAL | Status: DC
  Administered 2024-01-25 – 2024-02-01 (×8): 60 mg via ORAL
  Filled 2024-01-24 (×8): qty 1

## 2024-01-24 MED ORDER — MAGNESIUM GLUCONATE 500 (27 MG) MG PO TABS
500.0000 mg | ORAL_TABLET | Freq: Every day | ORAL | Status: DC
Start: 2024-01-24 — End: 2024-01-24

## 2024-01-24 MED ORDER — CLONIDINE HCL 0.1 MG PO TABS: 0.1000 mg | ORAL_TABLET | Freq: Three times a day (TID) | ORAL | Status: DC

## 2024-01-24 MED ORDER — CLONIDINE HCL 0.2 MG PO TABS
0.2000 mg | ORAL_TABLET | Freq: Three times a day (TID) | ORAL | Status: DC
  Administered 2024-01-24 (×2): 0.2 mg via ORAL
  Filled 2024-01-24 (×3): qty 1

## 2024-01-24 MED ORDER — INSULIN ASPART PROT & ASPART (70-30 MIX) 100 UNIT/ML ~~LOC~~ SUSP
16.0000 [IU] | Freq: Two times a day (BID) | SUBCUTANEOUS | Status: DC
  Administered 2024-01-24 (×2): 16 [IU] via SUBCUTANEOUS
  Filled 2024-01-24: qty 10

## 2024-01-24 MED ORDER — MAGNESIUM OXIDE -MG SUPPLEMENT 400 (240 MG) MG PO TABS
400.0000 mg | ORAL_TABLET | Freq: Every day | ORAL | Status: DC
  Administered 2024-01-24 – 2024-02-01 (×9): 400 mg via ORAL
  Filled 2024-01-24 (×9): qty 1

## 2024-01-24 MED ORDER — ALPRAZOLAM 0.25 MG PO TABS
0.2500 mg | ORAL_TABLET | Freq: Three times a day (TID) | ORAL | Status: DC | PRN
  Administered 2024-01-24 – 2024-01-29 (×4): 0.25 mg via ORAL
  Filled 2024-01-24 (×4): qty 1

## 2024-01-24 MED ORDER — LABETALOL HCL 200 MG PO TABS: 300.0000 mg | ORAL_TABLET | Freq: Two times a day (BID) | ORAL | Status: DC

## 2024-01-24 NOTE — Progress Notes (Signed)
 Pt BP remained elevated throughout the shift MD made aware. BPs were collected by dinemap and manual cuff, PRN hydralazine  administered twice during shift, MD notified of consistent high BPs, and a 1 time order was placed for isosorbide , medication was administered and patients blood pressures remainsa between 230/70-235/80. Pt is not experiencing any pain, dizziness, or headaches R/T to HTN. Patient resting well at this time.

## 2024-01-24 NOTE — Progress Notes (Signed)
 PROGRESS NOTE    Patient: Emily Jennings                            PCP: Towanda Fret, MD                    DOB: 15-Nov-1933            DOA: 01/22/2024 UEA:[id number]             DOS: 01/24/2024, 11:39 AM   LOS: 2 days   Date of Service: The patient was seen and examined on 01/24/2024  Subjective:  The patient was seen and examined this morning, stable no acute distress Blood pressure this morning was elevated as high as 222/78, pulse of 65, Denies of having any chest pain, shortness of breath, headaches or visual changes Brief Narrative:    Emily Jennings is an 89 y.o. female with medical history significant of hypertension, T2DM, CHF, CAD, CKD stage IV who presents to the emergency department due to elevated blood sugar within last 2 to 3 days. She states that she has been compliant with her insulin  as prescribed.  Patient was admitted with hyperglycemia in the setting of type 2 diabetes as well as AKI on CKD stage V.  She was also noted to be hypothermic and hyponatremic ED Course:  BP was 152/117, temperature 94.47F.  CBC showed normocytic anemia, BMP showed sodium of 123, potassium 3.0, chloride 84, bicarb 26, blood glucose 356, BUN 78, creatinine 3.70, GFR 11.  Urinalysis was positive for glycosuria, magnesium 2.3 Patient was treated with 10 units of NovoLog , IV hydration was provided.     Assessment & Plan:   Principal Problem:   Type 2 diabetes mellitus with hyperglycemia (HCC) Active Problems:   Acute kidney injury superimposed on chronic kidney disease (HCC)   Hyponatremia   Hypothermia   Hypokalemia   Mixed hyperlipidemia   Essential hypertension   Type 2 diabetes mellitus with hyperglycemia CBG (last 3)  Recent Labs    01/23/24 2115 01/24/24 0746 01/24/24 1126  GLUCAP 279* 107* 97   Semglee  discontinued - Starting NovoLog  70/30 14 >> 16  units twice daily  and adjust dose accordingly Continue ISS and hypoglycemia protocol   Acute kidney injury  superimposed on CKD stage V - Improving POA: BUN/creatinine 70/3.70 (baseline creatinine 2.3-2.4) - Holding nephrotoxic, gentle IVF -discontinued Lab Results  Component Value Date   CREATININE 3.09 (H) 01/24/2024   CREATININE 3.29 (H) 01/23/2024   CREATININE 3.70 (H) 01/22/2024   -I encourage oral hydration, with Pedialyte  Hypothermia -resolved-  TSH normal at 2.6, improved glycemic control   Hypokalemia -monitoring and replacing, checking magnesium level   Hyponatremia Na 123, corrected sodium level based on hyperglycemia (350) is 127 Serum osmolality 290, -Serum sodium 123->>>> 130, 130  Accelerated hypertension Essential hypertension - Elevated BP continue home medication of hydralazine   - switch Coreg  to labetalol  --- titrate dose up Nifedipine  and Lasix  held at this time due to acute kidney injury   Mixed hyperlipidemia Continue Crestor    Diastolic CHF (congestive heart failure)  Continue total input/output, daily weights  H/o Diastolic CHF.  Stable and compensated.   Last echo 04/2023 EF of 65 to 70% grade 2 DD.    ----------------------------------------------------------------------------------------------------------------------------------------------- Nutritional status:  The patient's BMI is: Body mass index is 23.72 kg/m. I agree with the assessment and plan as outlined -----------------------------------------------------------------------------------------------------------------  DVT prophylaxis:  heparin  injection 5,000  Units Start: 01/22/24 0845 SCDs Start: 01/22/24 0746   Code Status:   Code Status: Full Code  Family Communication: No family member present at bedside- attempt will be made to update daily  -Advance care planning has been discussed.   Admission status:   Status is: Inpatient Remains inpatient appropriate because: Needing IV fluids, electrolyte replacement   Disposition: From  - home             Planning for discharge in 1-2  days: to home with home health  Procedures:   No admission procedures for hospital encounter.   Antimicrobials:  Anti-infectives (From admission, onward)    None        Medication:   heparin   5,000 Units Subcutaneous Q8H   hydrALAZINE   100 mg Oral TID   insulin  aspart  0-5 Units Subcutaneous QHS   insulin  aspart  0-6 Units Subcutaneous TID WC   insulin  aspart protamine- aspart  16 Units Subcutaneous BID WC   [START ON 01/25/2024] isosorbide  mononitrate  60 mg Oral Daily   labetalol   300 mg Oral BID   magnesium oxide  400 mg Oral Daily   Pedialyte  1,000 mL Oral Q8H   potassium chloride   40 mEq Oral Once   rosuvastatin   5 mg Oral Daily    acetaminophen  **OR** acetaminophen , ALPRAZolam , hydrALAZINE , ondansetron  **OR** ondansetron  (ZOFRAN ) IV   Objective:   Vitals:   01/24/24 0747 01/24/24 0819 01/24/24 0934 01/24/24 0935  BP: (!) 222/78 (!) 218/84 (!) 184/100 (!) 184/100  Pulse:  64 65   Resp:   16   Temp:   97.9 F (36.6 C)   TempSrc:   Oral   SpO2:   98%   Weight:      Height:        Intake/Output Summary (Last 24 hours) at 01/24/2024 1139 Last data filed at 01/24/2024 0906 Gross per 24 hour  Intake 1365.49 ml  Output 100 ml  Net 1265.49 ml   Filed Weights   01/22/24 0420 01/23/24 0500 01/24/24 0500  Weight: 52.2 kg 52.3 kg 55.1 kg     Physical examination:   General:  AAO x 3,  cooperative, no distress;   HEENT:  Normocephalic, PERRL, otherwise with in Normal limits   Neuro:  CNII-XII intact. , normal motor and sensation, reflexes intact   Lungs:   Clear to auscultation BL, Respirations unlabored,  No wheezes / crackles  Cardio:    S1/S2, RRR, No murmure, No Rubs or Gallops   Abdomen:  Soft, non-tender, bowel sounds active all four quadrants, no guarding or peritoneal signs.  Muscular  skeletal:  Limited exam -global generalized weaknesses - in bed, able to move all 4 extremities,   2+ pulses,  symmetric, No pitting edema  Skin:  Dry, warm to  touch, negative for any Rashes,  Wounds: Please see nursing documentation    -------------------------------------------------------------------------------------------------------------------------------    LABs:     Latest Ref Rng & Units 01/23/2024    3:26 AM 01/22/2024    1:22 AM 07/27/2023    9:15 AM  CBC  WBC 4.0 - 10.5 K/uL 5.9  5.1  7.4   Hemoglobin 12.0 - 15.0 g/dL 16.1  9.4  9.2   Hematocrit 36.0 - 46.0 % 29.0  26.1  28.2   Platelets 150 - 400 K/uL 245  192  256       Latest Ref Rng & Units 01/24/2024    8:47 AM 01/23/2024    3:26 AM 01/22/2024  1:22 AM  CMP  Glucose 70 - 99 mg/dL 98  161  096   BUN 8 - 23 mg/dL 58  63  70   Creatinine 0.44 - 1.00 mg/dL 0.45  4.09  8.11   Sodium 135 - 145 mmol/L 130  130  123   Potassium 3.5 - 5.1 mmol/L 3.1  3.6  3.0   Chloride 98 - 111 mmol/L 94  94  84   CO2 22 - 32 mmol/L 28  26  26    Calcium  8.9 - 10.3 mg/dL 8.4  8.4  8.5   Total Protein 6.5 - 8.1 g/dL  4.9    Total Bilirubin 0.0 - 1.2 mg/dL  0.4    Alkaline Phos 38 - 126 U/L  80    AST 15 - 41 U/L  19    ALT 0 - 44 U/L  12         Micro Results Recent Results (from the past 240 hours)  MRSA Next Gen by PCR, Nasal     Status: None   Collection Time: 01/22/24  6:40 AM   Specimen: Nasal Mucosa; Nasal Swab  Result Value Ref Range Status   MRSA by PCR Next Gen NOT DETECTED NOT DETECTED Final    Comment: (NOTE) The GeneXpert MRSA Assay (FDA approved for NASAL specimens only), is one component of a comprehensive MRSA colonization surveillance program. It is not intended to diagnose MRSA infection nor to guide or monitor treatment for MRSA infections. Test performance is not FDA approved in patients less than 92 years old. Performed at [facility], [address], [place], [place] [address]   Resp panel by RT-PCR (RSV, Flu A&B, Covid) Anterior Nasal Swab     Status: None   Collection Time: 01/22/24 12:53 PM   Specimen: Anterior Nasal Swab  Result Value Ref Range Status    SARS Coronavirus 2 by RT PCR NEGATIVE NEGATIVE Final    Comment: (NOTE) SARS-CoV-2 target nucleic acids are NOT DETECTED.  The SARS-CoV-2 RNA is generally detectable in upper respiratory specimens during the acute phase of infection. The lowest concentration of SARS-CoV-2 viral copies this assay can detect is 138 copies/mL. A negative result does not preclude SARS-Cov-2 infection and should not be used as the sole basis for treatment or other patient management decisions. A negative result may occur with  improper specimen collection/handling, submission of specimen other than nasopharyngeal swab, presence of viral mutation(s) within the areas targeted by this assay, and inadequate number of viral copies(<138 copies/mL). A negative result must be combined with clinical observations, patient history, and epidemiological information. The expected result is Negative.  Fact Sheet for Patients:  BloggerCourse.com  Fact Sheet for Healthcare Providers:  SeriousBroker.it  This test is no t yet approved or cleared by the [place]  FDA and  has been authorized for detection and/or diagnosis of SARS-CoV-2 by FDA under an Emergency Use Authorization (EUA). This EUA will remain  in effect (meaning this test can be used) for the duration of the COVID-19 declaration under Section 564(b)(1) of the Act, 21 U.S.C.section 360bbb-3(b)(1), unless the authorization is terminated  or revoked sooner.       Influenza A by PCR NEGATIVE NEGATIVE Final   Influenza B by PCR NEGATIVE NEGATIVE Final    Comment: (NOTE) The Xpert Xpress SARS-CoV-2/FLU/RSV plus assay is intended as an aid in the diagnosis of influenza from Nasopharyngeal swab specimens and should not be used as a sole basis for treatment. Nasal washings and aspirates  are unacceptable for Xpert Xpress SARS-CoV-2/FLU/RSV testing.  Fact Sheet for  Patients: BloggerCourse.com  Fact Sheet for Healthcare Providers: SeriousBroker.it  This test is not yet approved or cleared by the [place]  FDA and has been authorized for detection and/or diagnosis of SARS-CoV-2 by FDA under an Emergency Use Authorization (EUA). This EUA will remain in effect (meaning this test can be used) for the duration of the COVID-19 declaration under Section 564(b)(1) of the Act, 21 U.S.C. section 360bbb-3(b)(1), unless the authorization is terminated or revoked.     Resp Syncytial Virus by PCR NEGATIVE NEGATIVE Final    Comment: (NOTE) Fact Sheet for Patients: BloggerCourse.com  Fact Sheet for Healthcare Providers: SeriousBroker.it  This test is not yet approved or cleared by the [place]  FDA and has been authorized for detection and/or diagnosis of SARS-CoV-2 by FDA under an Emergency Use Authorization (EUA). This EUA will remain in effect (meaning this test can be used) for the duration of the COVID-19 declaration under Section 564(b)(1) of the Act, 21 U.S.C. section 360bbb-3(b)(1), unless the authorization is terminated or revoked.  Performed at [facility], [address] [place], [place] [address]     Radiology Reports No results found.   SIGNED: Bobbetta Burnet, MD, FHM. FAAFP. Arlin Benes - [facility] Time spent - 55 min.  In seeing, evaluating and examining the patient. Reviewing medical records, labs, drawn plan of care. [facility],  Pager (please use [email or web address] to page/ text) Please use Epic Secure Chat for non-urgent communication (7AM-7PM)  If 7PM-7AM, please contact night-coverage [email or web address], 01/24/2024, 11:39 AM

## 2024-01-24 NOTE — Progress Notes (Signed)
 Pt removed right arm IV herself,IV intact upon removal, 30 mins later while taking pts bp a hematoma started to form at old IV site. Ice pack applied and swelling has went down, pt has a bruise on right anterior forearm from hematoma. MD notified

## 2024-01-24 NOTE — Plan of Care (Signed)
  Problem: Acute Rehab PT Goals(only PT should resolve) Goal: Pt Will Go Supine/Side To Sit Outcome: Progressing Flowsheets (Taken 01/24/2024 1448) Pt will go Supine/Side to Sit:  with supervision  with contact guard assist Goal: Patient Will Transfer Sit To/From Stand Outcome: Progressing Flowsheets (Taken 01/24/2024 1448) Patient will transfer sit to/from stand: with supervision Goal: Pt Will Transfer Bed To Chair/Chair To Bed Outcome: Progressing Flowsheets (Taken 01/24/2024 1448) Pt will Transfer Bed to Chair/Chair to Bed: with supervision Goal: Pt Will Ambulate Outcome: Progressing Flowsheets (Taken 01/24/2024 1448) Pt will Ambulate:  50 feet  with supervision  with rolling walker   2:48 PM, 01/24/24 Walton Guppy, MPT Physical Therapist with [facility] [phone] office [phone] mobile phone

## 2024-01-24 NOTE — Evaluation (Signed)
 Physical Therapy Evaluation Patient Details Name: Emily Jennings MRN: [id number] DOB: Dec 28, 1933 Today's Date: 01/24/2024  History of Present Illness  Emily Jennings is an 89 y.o. female with medical history significant of hypertension, T2DM, CHF, CAD, CKD stage IV who presents to the emergency department due to elevated blood sugar within last 2 to 3 days. She states that she has been compliant with her insulin  as prescribed.  Patient denies chest pain, shortness of breath.  She endorsed right hand swelling.   Clinical Impression  Patient had difficulty using SPC due to poor standing balance, required use of RW with fair/good return for walking in room without loss of balance, but limited mostly due to c/o fatigue. Patient tolerated sitting up in chair after therapy. Patient will benefit from continued skilled physical therapy in hospital and recommended venue below to increase strength, balance, endurance for safe ADLs and gait.          If plan is discharge home, recommend the following: A little help with walking and/or transfers;A little help with bathing/dressing/bathroom;Help with stairs or ramp for entrance;Assistance with cooking/housework   Can travel by private vehicle        Equipment Recommendations None recommended by PT  Recommendations for Other Services       Functional Status Assessment Patient has had a recent decline in their functional status and demonstrates the ability to make significant improvements in function in a reasonable and predictable amount of time.     Precautions / Restrictions Precautions Precautions: Fall Restrictions Weight Bearing Restrictions Per Provider Order: No      Mobility  Bed Mobility Overal bed mobility: Needs Assistance Bed Mobility: Supine to Sit     Supine to sit: Contact guard, Min assist, HOB elevated     General bed mobility comments: incresed time, labored movement    Transfers Overall transfer level: Needs  assistance Equipment used: Rolling walker (2 wheels), None Transfers: Sit to/from Stand, Bed to chair/wheelchair/BSC Sit to Stand: Contact guard assist, Min assist   Step pivot transfers: Contact guard assist, Min assist       General transfer comment: poor return for using SPC due to weakness, safer using RW    Ambulation/Gait Ambulation/Gait assistance: Contact guard assist, Min assist Gait Distance (Feet): 35 Feet Assistive device: Rolling walker (2 wheels) Gait Pattern/deviations: Decreased step length - right, Decreased step length - left, Decreased stride length Gait velocity: decreased     General Gait Details: slow labored cadence without loss of balance using RW and limited mostly due to fatigue  Stairs            Wheelchair Mobility     Tilt Bed    Modified Rankin (Stroke Patients Only)       Balance Overall balance assessment: Needs assistance Sitting-balance support: Feet supported, No upper extremity supported Sitting balance-Leahy Scale: Fair Sitting balance - Comments: fair/good seated at EOB   Standing balance support: During functional activity, Single extremity supported Standing balance-Leahy Scale: Poor Standing balance comment: fair/good using RW                             Pertinent Vitals/Pain Pain Assessment Pain Assessment: Faces Faces Pain Scale: Hurts little more Pain Location: ears, chest Pain Descriptors / Indicators: Discomfort, Sore Pain Intervention(s): Limited activity within patient's tolerance, Monitored during session    Home Living Family/patient expects to be discharged to:: Private residence Living Arrangements: Spouse/significant other;Children Available Help  at Discharge: Family;Available 24 hours/day Type of Home: House Home Access: Stairs to enter Entrance Stairs-Rails: None Entrance Stairs-Number of Steps: 1   Home Layout: One level Home Equipment: Cane - single point;Shower Counsellor  (2 wheels)      Prior Function Prior Level of Function : Independent/Modified Independent             Mobility Comments: household ambulation using SPC ADLs Comments: Assisted by family     Extremity/Trunk Assessment   Upper Extremity Assessment Upper Extremity Assessment: Overall WFL for tasks assessed    Lower Extremity Assessment Lower Extremity Assessment: Generalized weakness    Cervical / Trunk Assessment Cervical / Trunk Assessment: Kyphotic  Communication   Communication Communication: No apparent difficulties    Cognition Arousal: Alert Behavior During Therapy: WFL for tasks assessed/performed   PT - Cognitive impairments: No apparent impairments                         Following commands: Intact       Cueing Cueing Techniques: Verbal cues     General Comments      Exercises     Assessment/Plan    PT Assessment Patient needs continued PT services  PT Problem List Decreased strength;Decreased activity tolerance;Decreased balance;Decreased mobility       PT Treatment Interventions DME instruction;Gait training;Stair training;Functional mobility training;Therapeutic activities;Therapeutic exercise;Balance training;Patient/family education    PT Goals (Current goals can be found in the Care Plan section)  Acute Rehab PT Goals Patient Stated Goal: return home with family to assist PT Goal Formulation: With patient Time For Goal Achievement: 01/28/24 Potential to Achieve Goals: Good    Frequency Min 3X/week     Co-evaluation               AM-PAC PT "6 Clicks" Mobility  Outcome Measure Help needed turning from your back to your side while in a flat bed without using bedrails?: A Little Help needed moving from lying on your back to sitting on the side of a flat bed without using bedrails?: A Little Help needed moving to and from a bed to a chair (including a wheelchair)?: A Little Help needed standing up from a chair using  your arms (e.g., wheelchair or bedside chair)?: A Little Help needed to walk in hospital room?: A Little Help needed climbing 3-5 steps with a railing? : A Lot 6 Click Score: 17    End of Session   Activity Tolerance: Patient tolerated treatment well;Patient limited by fatigue Patient left: in chair;with call bell/phone within reach Nurse Communication: Mobility status PT Visit Diagnosis: Unsteadiness on feet (R26.81);Other abnormalities of gait and mobility (R26.89);Muscle weakness (generalized) (M62.81)    Time: 1610-9604 PT Time Calculation (min) (ACUTE ONLY): 20 min   Charges:   PT Evaluation $PT Eval Moderate Complexity: 1 Mod PT Treatments $Therapeutic Activity: 8-22 mins PT General Charges $$ ACUTE PT VISIT: 1 Visit         2:47 PM, 01/24/24 Walton Guppy, MPT Physical Therapist with [facility] 336 [phone] office [phone] mobile phone

## 2024-01-24 NOTE — TOC Progression Note (Signed)
 Transition of Care [facility]) - Progression Note    Patient Details  Name: Emily Jennings MRN: [id number] Date of Birth: November 17, 1933  Transition of Care [facility]) CM/SW Contact  Linnea Richards, LCSW Phone Number: 01/24/2024, 12:14 PM  Clinical Narrative:     TOC following. PT recommending HHPT at dc. Spoke with pt at bedside to review.  Pt agreeable to [facility]. CMS provider options reviewed and referred as requested. Cory at [facility] accepted referral. Information added to AVS.  TOC will follow.  Expected Discharge Plan: Home w Home Health Services Barriers to Discharge: Continued Medical Work up  Expected Discharge Plan and Services In-house Referral: Clinical Social Work   Post Acute Care Choice: Home Health Living arrangements for the past 2 months: Single Family Home                           HH Arranged: PT HH Agency: [facility] Date [facility] Agency Contacted: 01/24/24   Representative spoke with at [facility] Agency: Randel Buss   Social Determinants of Health (SDOH) Interventions SDOH Screenings   Food Insecurity: No Food Insecurity (01/22/2024)  Housing: Low Risk  (01/22/2024)  Transportation Needs: No Transportation Needs (01/22/2024)  Utilities: Not At Risk (01/22/2024)  Alcohol Screen: Low Risk  (11/15/2022)  Depression (PHQ2-9): Low Risk  (10/24/2023)  Financial Resource Strain: Low Risk  (12/28/2022)  Physical Activity: Sufficiently Active (11/15/2022)  Social Connections: Socially Integrated (01/22/2024)  Stress: No Stress Concern Present (12/28/2022)  Tobacco Use: Low Risk  (01/22/2024)    Readmission Risk Interventions    01/23/2024    8:19 AM 07/19/2023    2:27 PM 07/04/2023    8:27 AM  Readmission Risk Prevention Plan  Transportation Screening Complete Complete Complete  HRI or Home Care Consult Complete    Social Work Consult for Recovery Care Planning/Counseling Complete    Palliative Care Screening Not Applicable    Medication Review Oceanographer) Complete Complete  Complete  PCP or Specialist appointment within 3-5 days of discharge  Not Complete   HRI or Home Care Consult  Complete Complete  SW Recovery Care/Counseling Consult  Complete Complete  Palliative Care Screening  Not Applicable Not Applicable  Skilled Nursing Facility  Not Applicable Not Applicable

## 2024-01-24 NOTE — Plan of Care (Signed)
  Problem: Education: Goal: Knowledge of General Education information will improve Description: Including pain rating scale, medication(s)/side effects and non-pharmacologic comfort measures Outcome: Progressing   Problem: Health Behavior/Discharge Planning: Goal: Ability to manage health-related needs will improve Outcome: Progressing   Problem: Clinical Measurements: Goal: Will remain free from infection Outcome: Progressing Goal: Diagnostic test results will improve Outcome: Progressing Goal: Respiratory complications will improve Outcome: Progressing   Problem: Activity: Goal: Risk for activity intolerance will decrease Outcome: Progressing   Problem: Nutrition: Goal: Adequate nutrition will be maintained Outcome: Progressing   Problem: Coping: Goal: Level of anxiety will decrease Outcome: Progressing   Problem: Elimination: Goal: Will not experience complications related to bowel motility Outcome: Progressing   Problem: Safety: Goal: Ability to remain free from injury will improve Outcome: Progressing   Problem: Skin Integrity: Goal: Risk for impaired skin integrity will decrease Outcome: Progressing

## 2024-01-25 DIAGNOSIS — Z794 Long term (current) use of insulin: Secondary | ICD-10-CM | POA: Diagnosis not present

## 2024-01-25 DIAGNOSIS — E1165 Type 2 diabetes mellitus with hyperglycemia: Secondary | ICD-10-CM | POA: Diagnosis not present

## 2024-01-25 LAB — BASIC METABOLIC PANEL WITH GFR
Anion gap: 5 (ref 5–15)
BUN: 64 mg/dL — ABNORMAL HIGH (ref 8–23)
CO2: 29 mmol/L (ref 22–32)
Calcium: 8.3 mg/dL — ABNORMAL LOW (ref 8.9–10.3)
Chloride: 100 mmol/L (ref 98–111)
Creatinine, Ser: 2.93 mg/dL — ABNORMAL HIGH (ref 0.44–1.00)
GFR, Estimated: 15 mL/min — ABNORMAL LOW (ref >60)
Glucose, Bld: 24 mg/dL — CL (ref 70–99)
Potassium: 4 mmol/L (ref 3.5–5.1)
Sodium: 134 mmol/L — ABNORMAL LOW (ref 135–145)

## 2024-01-25 LAB — URINALYSIS, ROUTINE W REFLEX MICROSCOPIC
Bilirubin Urine: NEGATIVE
Glucose, UA: 150 mg/dL — AB
Hgb urine dipstick: NEGATIVE
Ketones, ur: NEGATIVE mg/dL
Leukocytes,Ua: NEGATIVE
Nitrite: NEGATIVE
Protein, ur: >=300 mg/dL — AB
Specific Gravity, Urine: 1.007 (ref 1.005–1.030)
pH: 5 (ref 5.0–8.0)

## 2024-01-25 LAB — MAGNESIUM: Magnesium: 2.3 mg/dL (ref 1.7–2.4)

## 2024-01-25 LAB — GLUCOSE, CAPILLARY
Glucose-Capillary: 157 mg/dL — ABNORMAL HIGH (ref 70–99)
Glucose-Capillary: 168 mg/dL — ABNORMAL HIGH (ref 70–99)
Glucose-Capillary: 189 mg/dL — ABNORMAL HIGH (ref 70–99)
Glucose-Capillary: 199 mg/dL — ABNORMAL HIGH (ref 70–99)
Glucose-Capillary: 220 mg/dL — ABNORMAL HIGH (ref 70–99)
Glucose-Capillary: 37 mg/dL — CL (ref 70–99)
Glucose-Capillary: 43 mg/dL — CL (ref 70–99)

## 2024-01-25 LAB — BRAIN NATRIURETIC PEPTIDE: B Natriuretic Peptide: 1059 pg/mL — ABNORMAL HIGH (ref 0.0–100.0)

## 2024-01-25 MED ORDER — ALBUMIN HUMAN 25 % IV SOLN
25.0000 g | Freq: Once | INTRAVENOUS | Status: AC
  Administered 2024-01-25: 12.5 g via INTRAVENOUS
  Filled 2024-01-25: qty 100

## 2024-01-25 MED ORDER — DEXTROSE 50 % IV SOLN: 1.0000 | Freq: Once | INTRAVENOUS | Status: DC

## 2024-01-25 MED ORDER — FUROSEMIDE 10 MG/ML IJ SOLN
20.0000 mg | Freq: Four times a day (QID) | INTRAMUSCULAR | Status: DC
  Administered 2024-01-25: 20 mg via INTRAVENOUS
  Filled 2024-01-25: qty 2

## 2024-01-25 MED ORDER — FUROSEMIDE 10 MG/ML IJ SOLN: 40.0000 mg | Freq: Two times a day (BID) | INTRAMUSCULAR | Status: DC

## 2024-01-25 MED ORDER — CLONIDINE HCL 0.1 MG PO TABS
0.1000 mg | ORAL_TABLET | Freq: Three times a day (TID) | ORAL | Status: DC
  Administered 2024-01-25: 0.1 mg via ORAL
  Filled 2024-01-25: qty 1

## 2024-01-25 MED ORDER — DEXTROSE 50 % IV SOLN
INTRAVENOUS | Status: AC
  Administered 2024-01-25: 50 mL
  Filled 2024-01-25: qty 50

## 2024-01-25 MED ORDER — INSULIN ASPART PROT & ASPART (70-30 MIX) 100 UNIT/ML ~~LOC~~ SUSP
10.0000 [IU] | Freq: Two times a day (BID) | SUBCUTANEOUS | Status: DC
  Administered 2024-01-25 (×2): 10 [IU] via SUBCUTANEOUS

## 2024-01-25 MED ORDER — CLONIDINE HCL 0.2 MG PO TABS
0.3000 mg | ORAL_TABLET | Freq: Three times a day (TID) | ORAL | Status: DC
  Administered 2024-01-25: 0.3 mg via ORAL
  Filled 2024-01-25: qty 1

## 2024-01-25 MED ORDER — FUROSEMIDE 10 MG/ML IJ SOLN
40.0000 mg | Freq: Two times a day (BID) | INTRAMUSCULAR | Status: AC
  Administered 2024-01-25 – 2024-01-26 (×3): 40 mg via INTRAVENOUS
  Filled 2024-01-25 (×3): qty 4

## 2024-01-25 MED ORDER — CLONIDINE HCL 0.2 MG PO TABS
0.2000 mg | ORAL_TABLET | Freq: Three times a day (TID) | ORAL | Status: DC
  Administered 2024-01-25: 0.2 mg via ORAL
  Filled 2024-01-25: qty 1

## 2024-01-25 NOTE — Consult Note (Addendum)
 [facility] Nephrology Consultation Note  Requesting MD: Dr. Amber Bail Reason for consult: AKI on CKD  HPI:  Emily Jennings is a 89 y.o. female with past medical history significant for hypertension, type II DM, CAD, CHF with preserved EF/diastolic dysfunction, CKD stage IV who was initially presented to the ER for elevated blood sugar level over 500 and dizziness, seen as a consultation for further evaluation of AKI on CKD. In the ER, patient was initially hypothermic and hypertensive.  In room air.  The labs showed sodium level of 123, potassium 3.0, BUN 70, creatinine level 3.7, associated with elevated blood sugar level.  The patient was treated with insulin , IV hydration.  The creatinine level continued to improve to 2.93 today associated with hyperglycemia and correction of potassium level.  Patient also developed hyper tensive urgency during hospitalization with peaked blood pressure around 235 systolic.  Treating with oral and IV antihypertensives including clonidine , diuretics, hydralazine , isosorbide , labetalol .  The blood pressure is slowly trending down.  Urine showed glucosuria and proteinuria.  The proteinuria seems to be chronic in nature thought to be due to diabetic nephropathy.  The kidney ultrasound with no hydronephrosis however showed more chronicity and chronic medical renal disease.  It seems like patient follows with Dr. Carrolyn Clan from nephrology for CKD management.  The recent baseline serum creatinine level seems to be around 2.3-2.9. The patient reports mild dyspnea but denies chest pain, nausea or vomiting.  No dysgeusia.  Urine output is not accurately measured. PMHx:   Past Medical History:  Diagnosis Date   Allergy    Anxiety    ANXIETY DISORDER, GENERALIZED 07/15/2007   Qualifier: Diagnosis of  By: Michaell Adolph     Arthritis    Cancer [facility]) 2009   breast, carcinoma in situ left   Carcinoma in situ of breast 05/21/2008   Qualifier: Diagnosis  of  By: Rodolph Clap MD, Margaret  Diagnosed in 2009, completed 5 year course of tamoxifen, no evidence of recurrence    Carotid stenosis    11/16/2005  mild plaque formation and stenosis proximal right ECA   Cataract    Complication of anesthesia    Coronary artery disease    cardiac catheterization on 03/20/2006  LAD mid 40% stenosis, left circumflex mild 40% stenosis, RCA mid-vessel 40% to 50% lesion   EF 60%   Diabetes mellitus    GERD (gastroesophageal reflux disease)    Hernia, inguinal    left   Hyperglycemia    Hypertension    Insomnia 11/16/2011   Low blood potassium    Non-insulin  dependent type 2 diabetes mellitus (HCC)    Osteoporosis    Shortness of breath    2D Echocardiogram 01/26/2009   EF of greater than 55%, mild MR, mild TR, normal ventricular function   Thickened endometrium 10/26/2017   Noted by gyne in 2017, missed 6 month follow up, referred in 09/2017   Ventricular tachycardia, non-sustained (HCC)    developed during stress test 02/08/2006, spontaneously aborted, mild reversible apical defect    Past Surgical History:  Procedure Laterality Date   BOWEL RESECTION Left 07/19/2023   Procedure: SMALL BOWEL RESECTION;  Surgeon: Awilda Bogus, MD;  Location: [facility];  Service: General;  Laterality: Left;   BREAST LUMPECTOMY Left 2009   Left breast 2009   CATARACT EXTRACTION W/PHACO Left 10/28/2014   Procedure: PHACO EMULSION CATARACT EXTRACTION WITH INTRAOCULAR LENS IMPLANT LEFT EYE (IOC);  Surgeon: Ben Bracken, MD;  Location: [facility] OR;  Service:  Ophthalmology;  Laterality: Left;   COLONOSCOPY     cyst removed from left foot     INGUINAL HERNIA REPAIR Left 07/19/2023   Procedure: HERNIA REPAIR FEMORAL INCARCERATED;  Surgeon: Awilda Bogus, MD;  Location: [facility];  Service: General;  Laterality: Left;   REFRACTIVE SURGERY Left     Family Hx:  Family History  Problem Relation Age of Onset   Hypertension Mother    Hyperlipidemia Mother    Stroke Mother     Urticaria Mother    Cancer Father        pancreatic   Colon cancer Father    Heart disease Brother 40       bypass   Heart disease Brother 59       bypass   Arthritis Other    Asthma Other    Diabetes Other    Colon cancer Paternal Aunt    Esophageal cancer Neg Hx    Stomach cancer Neg Hx    Rectal cancer Neg Hx     Social History:  reports that she has never smoked. She has never been exposed to tobacco smoke. She has never used smokeless tobacco. She reports that she does not drink alcohol and does not use drugs.  Allergies:  Allergies  Allergen Reactions   Farxiga [Dapagliflozin] Other (See Comments)    DKA   Metformin  And Related Diarrhea    Lost appetite and weight    Benadryl [Diphenhydramine Hcl] Hypertension   Citalopram Other (See Comments)    Unknown    Tramadol  Other (See Comments)    Felt light headed and dizzy   Amlodipine  Swelling   Crestor  [Rosuvastatin ] Swelling and Other (See Comments)    "Feet swelling" Makes her feel weak. Pt takes 5mg  at home    Medications: Prior to Admission medications   Medication Sig Start Date End Date Taking? Authorizing Provider  albuterol  (VENTOLIN  HFA) 108 (90 Base) MCG/ACT inhaler Inhale 2 puffs into the lungs every 2 (two) hours as needed for wheezing or shortness of breath. 07/16/23  Yes Del Orbe Polanco, Rogerio Clay, FNP  carvedilol  (COREG ) 12.5 MG tablet Take 12.5 mg by mouth 2 (two) times daily with a meal.   Yes [provider]  furosemide  (LASIX ) 40 MG tablet Take 1 tablet (40 mg total) by mouth daily. Patient taking differently: Take 40 mg by mouth 2 (two) times daily. 07/25/23 07/24/24 Yes Justina Oman, MD  hydrALAZINE  (APRESOLINE ) 100 MG tablet Take 1 tablet (100 mg total) by mouth 3 (three) times daily. 10/16/23  Yes Towanda Fret, MD  Insulin  Lispro Prot & Lispro (HUMALOG  MIX 75/25 KWIKPEN) (75-25) 100 UNIT/ML Kwikpen Inject 10 Units into the skin 2 (two) times daily before a meal. 12/05/23  Yes [name],  Gebreselassie W, MD  NIFEdipine  (ADALAT  CC) 60 MG 24 hr tablet Take 1 tablet (60 mg total) by mouth daily. 01/15/24  Yes BranchJoyceann No, MD  potassium chloride  SA (KLOR-CON  M) 20 MEQ tablet Take 20 mEq by mouth daily.   Yes [provider]  RESTASIS  0.05 % ophthalmic emulsion Place 1 drop into both eyes 2 (two) times daily. 11/01/23  Yes [provider]  rosuvastatin  (CRESTOR ) 5 MG tablet TAKE 1 TABLET (5 MG TOTAL) BY MOUTH DAILY. 11/06/23  Yes Towanda Fret, MD  Continuous Glucose Sensor (DEXCOM G7 SENSOR) MISC Patient is to change the sensor every 10 days to monitor her blood sugars as directed by the provider. 12/06/23   [name], Gebreselassie W, MD  I have reviewed the patient's current medications.  Labs: Renal Panel: Recent Labs  Lab 01/22/24 0122 01/23/24 0326 01/24/24 0847 01/25/24 0513  NA 123* 130* 130* 134*  K 3.0* 3.6 3.1* 4.0  CL 84* 94* 94* 100  CO2 26 26 28 29   GLUCOSE 356* 227* 98 24*  BUN 70* 63* 58* 64*  CREATININE 3.70* 3.29* 3.09* 2.93*  CALCIUM  8.5* 8.4* 8.4* 8.3*  MG 2.3 2.3 2.1 2.3  PHOS  --  3.5  --   --      CBC:    Latest Ref Rng & Units 01/23/2024    3:26 AM 01/22/2024    1:22 AM 07/27/2023    9:15 AM  CBC  WBC 4.0 - 10.5 K/uL 5.9  5.1  7.4   Hemoglobin 12.0 - 15.0 g/dL 44.0  9.4  9.2   Hematocrit 36.0 - 46.0 % 29.0  26.1  28.2   Platelets 150 - 400 K/uL 245  192  256      Anemia Panel:  Recent Labs    07/16/23 0921 07/18/23 1702 07/23/23 0448 07/25/23 0421 07/27/23 0915 01/22/24 0122 01/23/24 0326  HGB 11.0*   < > 7.6* 7.7* 9.2* 9.4* 10.0*  MCV 95   < > 94.8 98.0 96 89.7 92.1  FERRITIN 389*  --   --   --   --   --   --   TIBC 288  --   --   --   --   --   --   IRON 28  --   --   --   --   --   --    < > = values in this interval not displayed.    Recent Labs  Lab 01/23/24 0326  AST 19  ALT 12  ALKPHOS 80  BILITOT 0.4  PROT 4.9*  ALBUMIN  2.6*    Lab Results  Component Value Date   HGBA1C 7.0 (A)  10/08/2023    ROS:  Pertinent items noted in HPI and remainder of comprehensive ROS otherwise negative.  Physical Exam: Vitals:   01/25/24 0108 01/25/24 0520  BP: (!) 168/65 (!) 191/71  Pulse: 72 66  Resp:  18  Temp:  98 F (36.7 C)  SpO2:  99%     General exam: Appears calm and comfortable  Respiratory system: Clear to auscultation. Respiratory effort normal. No wheezing or crackle Cardiovascular system: S1 & S2 heard, RRR.  pedal edema++ Gastrointestinal system: Abdomen is nondistended, soft and nontender. Normal bowel sounds heard. Central nervous system: Alert and oriented. No focal neurological deficits. Extremities: Bilateral legs pitting edema present, no cyanosis Skin: No rashes, lesions or ulcers Psychiatry: Alert awake.   Assessment/Plan:  # Acute kidney injury on CKD stage IV with chronic proteinuria likely due to acute CHF exacerbation/hypertensive urgency.  Exam consistent with fluid overload.  I will start furosemide  40 mg IV and then adjust dose daily based on the response.  Kidney ultrasound rule out hydronephrosis.  The baseline creatinine level seems to be around 2.3-2.9 lately and patient follows with Dr. Carrolyn Clan as OP.  No need for dialysis at this time.  Continue current management.  Recommended strict ins and outs and daily lab. No ACE inhibitor, ARB or SGLT2 at this time.  # Hypertensive urgency: Agree with continuing current multiple antihypertensives.  IV diuretics will help offload the fluid as well.  Avoid sudden drop in BP.  # Type II DM with hyperglycemia and hypoglycemia: Adjustment of insulin  by  primary team.  # Acute on chronic diastolic CHF with fluid overload: Echo with preserved EF however has grade 2 diastolic dysfunction.  IV diuretics as above.  # Hyponatremia, hypervolemic: IV diuretics should help.  Recommend fluid restriction.  Thank you for the consult. Please contact on-call nephrologist over the weekend with any question or concern.   The lab/chart will be reviewed by nephrology team.   Clementine Cutting 01/25/2024, 11:16 AM  [facility].

## 2024-01-25 NOTE — Plan of Care (Signed)
   Problem: Education: Goal: Knowledge of General Education information will improve Description: Including pain rating scale, medication(s)/side effects and non-pharmacologic comfort measures Outcome: Progressing   Problem: Health Behavior/Discharge Planning: Goal: Ability to manage health-related needs will improve Outcome: Progressing   Problem: Clinical Measurements: Goal: Ability to maintain clinical measurements within normal limits will improve Outcome: Progressing Goal: Will remain free from infection Outcome: Progressing Goal: Diagnostic test results will improve Outcome: Progressing Goal: Respiratory complications will improve Outcome: Progressing Goal: Cardiovascular complication will be avoided Outcome: Progressing   Problem: Activity: Goal: Risk for activity intolerance will decrease Outcome: Progressing   Problem: Nutrition: Goal: Adequate nutrition will be maintained Outcome: Progressing   Problem: Coping: Goal: Level of anxiety will decrease Outcome: Progressing   Problem: Elimination: Goal: Will not experience complications related to bowel motility Outcome: Progressing Goal: Will not experience complications related to urinary retention Outcome: Progressing   Problem: Pain Managment: Goal: General experience of comfort will improve and/or be controlled Outcome: Progressing   Problem: Safety: Goal: Ability to remain free from injury will improve Outcome: Progressing   Problem: Skin Integrity: Goal: Risk for impaired skin integrity will decrease Outcome: Progressing   Problem: Education: Goal: Ability to describe self-care measures that may prevent or decrease complications (Diabetes Survival Skills Education) will improve Outcome: Progressing Goal: Individualized Educational Video(s) Outcome: Progressing   Problem: Coping: Goal: Ability to adjust to condition or change in health will improve Outcome: Progressing   Problem: Fluid  Volume: Goal: Ability to maintain a balanced intake and output will improve Outcome: Progressing   Problem: Health Behavior/Discharge Planning: Goal: Ability to identify and utilize available resources and services will improve Outcome: Progressing Goal: Ability to manage health-related needs will improve Outcome: Progressing   Problem: Metabolic: Goal: Ability to maintain appropriate glucose levels will improve Outcome: Progressing   Problem: Nutritional: Goal: Maintenance of adequate nutrition will improve Outcome: Progressing Goal: Progress toward achieving an optimal weight will improve Outcome: Progressing   Problem: Skin Integrity: Goal: Risk for impaired skin integrity will decrease Outcome: Progressing   Problem: Tissue Perfusion: Goal: Adequacy of tissue perfusion will improve Outcome: Progressing   Problem: Education: Goal: Ability to describe self-care measures that may prevent or decrease complications (Diabetes Survival Skills Education) will improve Outcome: Progressing Goal: Individualized Educational Video(s) Outcome: Progressing   Problem: Coping: Goal: Ability to adjust to condition or change in health will improve Outcome: Progressing   Problem: Fluid Volume: Goal: Ability to maintain a balanced intake and output will improve Outcome: Progressing   Problem: Health Behavior/Discharge Planning: Goal: Ability to identify and utilize available resources and services will improve Outcome: Progressing Goal: Ability to manage health-related needs will improve Outcome: Progressing   Problem: Metabolic: Goal: Ability to maintain appropriate glucose levels will improve Outcome: Progressing   Problem: Nutritional: Goal: Maintenance of adequate nutrition will improve Outcome: Progressing Goal: Progress toward achieving an optimal weight will improve Outcome: Progressing   Problem: Skin Integrity: Goal: Risk for impaired skin integrity will  decrease Outcome: Progressing   Problem: Tissue Perfusion: Goal: Adequacy of tissue perfusion will improve Outcome: Progressing

## 2024-01-25 NOTE — Progress Notes (Signed)
 Lab called and stated that patient's blood sugar was 24. Previous shift RN assisted and admin PRN Glucagon via IV. On recheck patients blood sugar was 199 and then 30 min later blood sugar was 157. Received new orders per provider for a dosage decrease on 70/30 and was instructed to not hold ISS for morning meal. Patient ate almost all of her breakfast.

## 2024-01-25 NOTE — Progress Notes (Signed)
 [facility] Liaison Note  01/25/2024  Emily Jennings 07-Jun-1934 [id number]  Location: RN Hospital Liaison screened the patient remotely at [facility].  Insurance: [employer]   Emily Jennings is a 89 y.o. female who is a Primary Care Patient of Towanda Fret, MD [facility]. The patient was screened for  readmission hospitalization with noted high risk score for unplanned readmission risk with 1 IP in 6 months.  The patient was assessed for potential Care Management service needs for post hospital transition for care coordination. Review of patient's electronic medical record reveals patient was admitted for Hyperglycemia. Pt previous managed by [facility] with [facility] however transitioned to a new RNCM as liaison will place another referral for services.  Plan: [facility] Liaison will continue to follow progress and disposition to asess for post hospital community care coordination/management needs.  Referral request for community care coordination: Liaison will make a referral for post hospital prevention readmission follow up calls.    [facility] Care Management/Population Health does not replace or interfere with any arrangements made by the Inpatient Transition of Care team.   For questions contact:   Lilla Reichert, RN, [facility] Liaison [place]   [facility], Population Health Office Hours MTWF  8:00 am-6:00 pm Direct Dial : [phone] mobile [email or web address]

## 2024-01-25 NOTE — Progress Notes (Signed)
 Mobility Specialist Progress Note:    01/25/24 1240  Mobility  Activity Ambulated with assistance in room  Level of Assistance Contact guard assist, steadying assist  Assistive Device Front wheel walker  Distance Ambulated (ft) 40 ft  Range of Motion/Exercises Active;All extremities  Activity Response Tolerated well  Mobility Referral Yes  Mobility visit 1 Mobility  Mobility Specialist Start Time (ACUTE ONLY) 1240  Mobility Specialist Stop Time (ACUTE ONLY) 1305  Mobility Specialist Time Calculation (min) (ACUTE ONLY) 25 min   Pt received in chair, agreeable to mobility. Required CGA to stand and ambulate with RW. Tolerated well, asx throughout. Returned pt to chair, call bell in reach. All needs met.  Americus Perkey Mobility Specialist Please contact via Special educational needs teacher or  Rehab office at [phone]

## 2024-01-25 NOTE — Inpatient Diabetes Management (Addendum)
 Inpatient Diabetes Program Recommendations  AACE/ADA: New Consensus Statement on Inpatient Glycemic Control   Target Ranges:  Prepandial:   less than 140 mg/dL      Peak postprandial:   less than 180 mg/dL (1-2 hours)      Critically ill patients:  140 - 180 mg/dL    Latest Reference Range & Units 01/24/24 07:46 01/24/24 11:26 01/24/24 17:00 01/24/24 20:36 01/25/24 07:44  Glucose-Capillary 70 - 99 mg/dL 161 (H) 97 78 79 096 (H)    Latest Reference Range & Units 01/25/24 05:13  Glucose 70 - 99 mg/dL 24 (LL)    Review of Glycemic Control  Diabetes history: DM2 Outpatient Diabetes medications: Humalog  75/25 10 units BID; Dexcom G7 CGM Current orders for Inpatient glycemic control: 70/30 16 units BID, Novolog  0-6 units TID with meals, Novolog  0-5 units QHS   Inpatient Diabetes Program Recommendations:     Insulin : Lab glucose 24 mg/dl at 0:45 am today. Please consider decreasing 70/30 to 10 units BID.    Outpatient DM: Per conversation with patient on 01/23/24, patient reports that since she was switched to Humalog  75/25 10 units BID on 12/05/23, her glucose has been running much higher. Patient would like to be discharged on Tresiba  at bedtime and Humalog  TID with meals. If she is discharged on Tresiba  and Humalog , she would need Rx for Tresiba  pens (#[id number]), Humalog  pens (#[id number]), and insulin  pen needles [phone]).  Thanks, Beacher Limerick, RN, MSN, CDCES Diabetes Coordinator Inpatient Diabetes Program [phone] (Team Pager from 8am to 5pm)

## 2024-01-25 NOTE — Progress Notes (Signed)
 PROGRESS NOTE    Patient: Emily Jennings                            PCP: Towanda Fret, MD                    DOB: 09-23-1933            DOA: 01/22/2024 ZOX:[id number]             DOS: 01/25/2024, 11:49 AM   LOS: 3 days   Date of Service: The patient was seen and examined on 01/25/2024  Subjective:   The patient was seen and examined this morning.  Stable denies any headaches visual changes Still hypertensive with BP of 191/71 this morning This morning had a hypoglycemic episode blood sugars low was 24 -doubt accuracy as patient had no symptoms     Brief Narrative:    Emily Jennings is an 89 y.o. female with medical history significant of hypertension, T2DM, CHF, CAD, CKD stage IV who presents to the emergency department due to elevated blood sugar within last 2 to 3 days. She states that she has been compliant with her insulin  as prescribed.  Patient was admitted with hyperglycemia in the setting of type 2 diabetes as well as AKI on CKD stage V.  She was also noted to be hypothermic and hyponatremic ED Course:  BP was 152/117, temperature 94.79F.  CBC showed normocytic anemia, BMP showed sodium of 123, potassium 3.0, chloride 84, bicarb 26, blood glucose 356, BUN 78, creatinine 3.70, GFR 11.  Urinalysis was positive for glycosuria, magnesium  2.3 Patient was treated with 10 units of NovoLog , IV hydration was provided.     Assessment & Plan:   Principal Problem:   Type 2 diabetes mellitus with hyperglycemia (HCC) Active Problems:   Acute kidney injury superimposed on chronic kidney disease (HCC)   Hyponatremia   Hypothermia   Hypokalemia   Mixed hyperlipidemia   Essential hypertension   Type 2 diabetes mellitus with hyperglycemia CBG (last 3)  Recent Labs    01/24/24 2036 01/25/24 0744 01/25/24 0832  GLUCAP 79 199* 157*  - Hyperglycemia overnight CBG as low as 24-questionable accuracy patient was asymptomatic  Semglee  discontinued - Starting NovoLog  70/30 14 >> 16 >>>  reducing to 10 units twice daily  and adjust dose accordingly Continue ISS and hypoglycemia protocol   Acute kidney injury superimposed on CKD stage V -Baseline creatinine 2.3-2.9 - Mild volume overload -Improving BUN/creatinine -Nephrology consulted, initiating IV Lasix  40 twice daily 0 replacing albumin  -Monitoring I's and O's  - POA: BUN/creatinine 70/3.70 (baseline creatinine 2.3-2.4) - Holding nephrotoxic,d Lab Results  Component Value Date   CREATININE 2.93 (H) 01/25/2024   CREATININE 3.09 (H) 01/24/2024   CREATININE 3.29 (H) 01/23/2024    Hypothermia -resolved-  TSH normal at 2.6, improved glycemic control   Hypokalemia -monitoring and replacing, checking magnesium  level   Hyponatremia, hypovolemia -Improving Na 123, corrected sodium level based on hyperglycemia (350) is 127 Serum osmolality 290, -Serum sodium 123->>>> 130, 130 >> 134  - Continue with IV Lasix  per nephrology recommendations  Accelerated hypertension Essential hypertension - Elevated BP continue home medication of hydralazine   - switch Coreg  to labetalol  --- titrate dose up Nifedipine  and Lasix  held at this time due to acute kidney injury   Mixed hyperlipidemia Continue Crestor    Diastolic CHF (congestive heart failure) -preserved ejection fraction 40 grade 1 diastolic dysfunction Continue total  input/output, daily weights  H/o Diastolic CHF.  Stable and compensated.   Last echo 04/2023 EF of 65 to 70% grade 2 DD.    ----------------------------------------------------------------------------------------------------------------------------------------------- Nutritional status:  The patient's BMI is: Body mass index is 23.9 kg/m. I agree with the assessment and plan as outlined -----------------------------------------------------------------------------------------------------------------  DVT prophylaxis:  heparin  injection 5,000 Units Start: 01/22/24 0845 SCDs Start: 01/22/24  0746   Code Status:   Code Status: Full Code  Family Communication: No family member present at bedside- attempt will be made to update daily  -Advance care planning has been discussed.   Admission status:   Status is: Inpatient Remains inpatient appropriate because: Needing IV fluids, electrolyte replacement   Disposition: From  - home             Planning for discharge in 2 days: to home with home health  Procedures:   No admission procedures for hospital encounter.   Antimicrobials:  Anti-infectives (From admission, onward)    None        Medication:   cloNIDine   0.1 mg Oral TID   furosemide   40 mg Intravenous BID   heparin   5,000 Units Subcutaneous Q8H   hydrALAZINE   100 mg Oral TID   insulin  aspart  0-5 Units Subcutaneous QHS   insulin  aspart  0-6 Units Subcutaneous TID WC   insulin  aspart protamine- aspart  10 Units Subcutaneous BID WC   isosorbide  mononitrate  60 mg Oral Daily   labetalol   300 mg Oral BID   magnesium  oxide  400 mg Oral Daily   Pedialyte  1,000 mL Oral Q8H   rosuvastatin   5 mg Oral Daily    acetaminophen  **OR** acetaminophen , ALPRAZolam , hydrALAZINE , ondansetron  **OR** ondansetron  (ZOFRAN ) IV   Objective:   Vitals:   01/24/24 2032 01/24/24 2322 01/25/24 0108 01/25/24 0520  BP: (!) 197/76 (!) 184/73 (!) 168/65 (!) 191/71  Pulse: 70 69 72 66  Resp: 18   18  Temp: 98 F (36.7 C)   98 F (36.7 C)  TempSrc: Oral   Oral  SpO2: 94%   99%  Weight:    55.5 kg  Height:        Intake/Output Summary (Last 24 hours) at 01/25/2024 1149 Last data filed at 01/25/2024 0500 Gross per 24 hour  Intake 780 ml  Output --  Net 780 ml   Filed Weights   01/23/24 0500 01/24/24 0500 01/25/24 0520  Weight: 52.3 kg 55.1 kg 55.5 kg     Physical examination:        General:  AAO x 3,  cooperative, no distress;   HEENT:  Normocephalic, PERRL, otherwise with in Normal limits   Neuro:  CNII-XII intact. , normal motor and sensation, reflexes  intact   Lungs:   Clear to auscultation BL, Respirations unlabored,  No wheezes / crackles  Cardio:    S1/S2, RRR, No murmure, No Rubs or Gallops   Abdomen:  Soft, non-tender, bowel sounds active all four quadrants, no guarding or peritoneal signs.  Muscular  skeletal:  Limited exam -global generalized weaknesses - in bed, able to move all 4 extremities,   2+ pulses,  symmetric, +2  pitting edema  Skin:  Dry, warm to touch, negative for any Rashes,  Wounds: Please see nursing documentation            LABs:     Latest Ref Rng & Units 01/23/2024    3:26 AM 01/22/2024    1:22 AM 07/27/2023    9:15  AM  CBC  WBC 4.0 - 10.5 K/uL 5.9  5.1  7.4   Hemoglobin 12.0 - 15.0 g/dL 08.6  9.4  9.2   Hematocrit 36.0 - 46.0 % 29.0  26.1  28.2   Platelets 150 - 400 K/uL 245  192  256       Latest Ref Rng & Units 01/25/2024    5:13 AM 01/24/2024    8:47 AM 01/23/2024    3:26 AM  CMP  Glucose 70 - 99 mg/dL 24  98  578   BUN 8 - 23 mg/dL 64  58  63   Creatinine 0.44 - 1.00 mg/dL 4.69  6.29  5.28   Sodium 135 - 145 mmol/L 134  130  130   Potassium 3.5 - 5.1 mmol/L 4.0  3.1  3.6   Chloride 98 - 111 mmol/L 100  94  94   CO2 22 - 32 mmol/L 29  28  26    Calcium  8.9 - 10.3 mg/dL 8.3  8.4  8.4   Total Protein 6.5 - 8.1 g/dL   4.9   Total Bilirubin 0.0 - 1.2 mg/dL   0.4   Alkaline Phos 38 - 126 U/L   80   AST 15 - 41 U/L   19   ALT 0 - 44 U/L   12        Micro Results Recent Results (from the past 240 hours)  MRSA Next Gen by PCR, Nasal     Status: None   Collection Time: 01/22/24  6:40 AM   Specimen: Nasal Mucosa; Nasal Swab  Result Value Ref Range Status   MRSA by PCR Next Gen NOT DETECTED NOT DETECTED Final    Comment: (NOTE) The GeneXpert MRSA Assay (FDA approved for NASAL specimens only), is one component of a comprehensive MRSA colonization surveillance program. It is not intended to diagnose MRSA infection nor to guide or monitor treatment for MRSA infections. Test performance is not  FDA approved in patients less than 25 years old. Performed at [facility], [address], [place], [place] [address]   Resp panel by RT-PCR (RSV, Flu A&B, Covid) Anterior Nasal Swab     Status: None   Collection Time: 01/22/24 12:53 PM   Specimen: Anterior Nasal Swab  Result Value Ref Range Status   SARS Coronavirus 2 by RT PCR NEGATIVE NEGATIVE Final    Comment: (NOTE) SARS-CoV-2 target nucleic acids are NOT DETECTED.  The SARS-CoV-2 RNA is generally detectable in upper respiratory specimens during the acute phase of infection. The lowest concentration of SARS-CoV-2 viral copies this assay can detect is 138 copies/mL. A negative result does not preclude SARS-Cov-2 infection and should not be used as the sole basis for treatment or other patient management decisions. A negative result may occur with  improper specimen collection/handling, submission of specimen other than nasopharyngeal swab, presence of viral mutation(s) within the areas targeted by this assay, and inadequate number of viral copies(<138 copies/mL). A negative result must be combined with clinical observations, patient history, and epidemiological information. The expected result is Negative.  Fact Sheet for Patients:  BloggerCourse.com  Fact Sheet for Healthcare Providers:  SeriousBroker.it  This test is no t yet approved or cleared by the [place]  FDA and  has been authorized for detection and/or diagnosis of SARS-CoV-2 by FDA under an Emergency Use Authorization (EUA). This EUA will remain  in effect (meaning this test can be used) for the duration of the COVID-19 declaration under Section 564(b)(1)  of the Act, 21 U.S.C.section 360bbb-3(b)(1), unless the authorization is terminated  or revoked sooner.       Influenza A by PCR NEGATIVE NEGATIVE Final   Influenza B by PCR NEGATIVE NEGATIVE Final    Comment: (NOTE) The Xpert Xpress SARS-CoV-2/FLU/RSV  plus assay is intended as an aid in the diagnosis of influenza from Nasopharyngeal swab specimens and should not be used as a sole basis for treatment. Nasal washings and aspirates are unacceptable for Xpert Xpress SARS-CoV-2/FLU/RSV testing.  Fact Sheet for Patients: BloggerCourse.com  Fact Sheet for Healthcare Providers: SeriousBroker.it  This test is not yet approved or cleared by the [place]  FDA and has been authorized for detection and/or diagnosis of SARS-CoV-2 by FDA under an Emergency Use Authorization (EUA). This EUA will remain in effect (meaning this test can be used) for the duration of the COVID-19 declaration under Section 564(b)(1) of the Act, 21 U.S.C. section 360bbb-3(b)(1), unless the authorization is terminated or revoked.     Resp Syncytial Virus by PCR NEGATIVE NEGATIVE Final    Comment: (NOTE) Fact Sheet for Patients: BloggerCourse.com  Fact Sheet for Healthcare Providers: SeriousBroker.it  This test is not yet approved or cleared by the [place]  FDA and has been authorized for detection and/or diagnosis of SARS-CoV-2 by FDA under an Emergency Use Authorization (EUA). This EUA will remain in effect (meaning this test can be used) for the duration of the COVID-19 declaration under Section 564(b)(1) of the Act, 21 U.S.C. section 360bbb-3(b)(1), unless the authorization is terminated or revoked.  Performed at [facility], [address], [place], [place] [address]     Radiology Reports US  RENAL Result Date: 01/24/2024 CLINICAL DATA:  [id number] HTN (hypertension) [id number] EXAM: RENAL / URINARY TRACT ULTRASOUND COMPLETE COMPARISON:  Jan 24, 2024, July 19, 2023 FINDINGS: Technically difficult exam due to limited acoustic windows from overlying bowel gas. Right Kidney: Renal measurements: 9.3 x 3.5 x 5 cm = volume: 84 mL. Diffusely increased echogenicity.  Lower pole cyst measuring 3.2 cm. A second smaller cyst measures 1.7 cm. No hydronephrosis or nephrolithiasis. Left Kidney: Renal measurements: 10.2 x 4.1 x 4.3 cm = volume: 94 mL. Diffusely increased echogenicity. Upper pole cyst measuring 3.2 cm. A second smaller cyst measures 2.1 cm. No hydronephrosis or nephrolithiasis. Bladder: Circumferential wall thickening of the urinary bladder. Other: Trace ascites. IMPRESSION: 1. No hydronephrosis or nephrolithiasis. Renal parenchymal changes consistent with chronic medical renal disease. 2. Circumferential wall thickening of the urinary bladder, which may be due to under distension or acute cystitis. Correlation with urinalysis recommended. Electronically Signed   By: Rance Burrows M.D.   On: 01/24/2024 16:57   CT RENAL STONE STUDY Result Date: 01/24/2024 CLINICAL DATA:  Stage 4 kidney disease, hyperglycemia. "Renal ischemia or infarction" EXAM: CT ABDOMEN AND PELVIS WITHOUT CONTRAST TECHNIQUE: Multidetector CT imaging of the abdomen and pelvis was performed following the standard protocol without IV contrast. RADIATION DOSE REDUCTION: This exam was performed according to the departmental dose-optimization program which includes automated exposure control, adjustment of the mA and/or kV according to patient size and/or use of iterative reconstruction technique. COMPARISON:  07/19/2023 FINDINGS: Lower chest: Diffuse cutaneous edema and small bilateral pleural effusions favoring third spacing of fluid. This has worsened from previous. Mild to moderate cardiomegaly. Low-density blood pool favors anemia. Coronary and aortic atherosclerosis. Hepatobiliary: Mildly hyperdense liver at 81 Hounsfield units in the parenchyma, raising the possibility of hemochromatosis. Stable fluid density liver lesions favoring cysts, no surveillance imaging of these lesions is indicated.  Gallbladder poorly seen, possibly contracted. Pancreas: Ill definition of pancreatic margins due to edema  in the mesentery. No definite abnormality. Spleen: Unremarkable Adrenals/Urinary Tract: Today's exam is not considered sensitive or specific for renal infarct given the lack of IV contrast. Renal sonography with careful color Doppler assessment of the renal parenchyma could be utilized to assess for hypovascular segments. Stable fluid density renal lesions compatible with cysts. Stable nonobstructive left nephrolithiasis. Urinary bladder unremarkable. Adrenal glands are indistinct but no definite adrenal abnormality is observed. Stomach/Bowel: No dilated bowel. Postoperative findings along right lower quadrant small bowel. Formed stool in the colon. Sigmoid colon diverticulosis. Vascular/Lymphatic: Extensive atheromatous vascular calcifications. No definite adenopathy. Reproductive: Vascular calcifications and suspected calcified uterine fibroids in the uterus. Other: Diffuse subcutaneous and mesenteric edema. No compelling findings of ascites. Musculoskeletal: IMPRESSION: 1. Today's exam is not considered sensitive or specific for renal infarct given the lack of IV contrast. Renal sonography with careful color Doppler assessment of the renal parenchyma could be utilized to assess for hypovascular segments. 2. Diffuse subcutaneous and mesenteric edema. Small bilateral pleural effusions. These findings have worsened from previous and suggest third spacing of fluid. 3. Mild to moderate cardiomegaly. 4. Low-density blood pool favors anemia. 5. Mildly hyperdense liver at 81 Hounsfield units in the parenchyma, raising the possibility of hemochromatosis. 6. Nonobstructive left nephrolithiasis. 7. Sigmoid colon diverticulosis. 8. Extensive atheromatous vascular calcifications. Aortic Atherosclerosis (ICD10-I70.0). Electronically Signed   By: Freida Jes M.D.   On: 01/24/2024 16:42     SIGNED: Bobbetta Burnet, MD, FHM. FAAFP. Arlin Benes - [facility] Time spent - 55 min.  In seeing, evaluating and  examining the patient. Reviewing medical records, labs, drawn plan of care. [facility],  Pager (please use [email or web address] to page/ text) Please use Epic Secure Chat for non-urgent communication (7AM-7PM)  If 7PM-7AM, please contact night-coverage [email or web address], 01/25/2024, 11:49 AM

## 2024-01-26 DIAGNOSIS — E1165 Type 2 diabetes mellitus with hyperglycemia: Secondary | ICD-10-CM | POA: Diagnosis not present

## 2024-01-26 DIAGNOSIS — Z794 Long term (current) use of insulin: Secondary | ICD-10-CM | POA: Diagnosis not present

## 2024-01-26 LAB — BASIC METABOLIC PANEL WITH GFR
Anion gap: 9 (ref 5–15)
BUN: 66 mg/dL — ABNORMAL HIGH (ref 8–23)
CO2: 27 mmol/L (ref 22–32)
Calcium: 8.6 mg/dL — ABNORMAL LOW (ref 8.9–10.3)
Chloride: 94 mmol/L — ABNORMAL LOW (ref 98–111)
Creatinine, Ser: 2.73 mg/dL — ABNORMAL HIGH (ref 0.44–1.00)
GFR, Estimated: 16 mL/min — ABNORMAL LOW (ref >60)
Glucose, Bld: 282 mg/dL — ABNORMAL HIGH (ref 70–99)
Potassium: 3.9 mmol/L (ref 3.5–5.1)
Sodium: 130 mmol/L — ABNORMAL LOW (ref 135–145)

## 2024-01-26 LAB — GLUCOSE, CAPILLARY
Glucose-Capillary: 168 mg/dL — ABNORMAL HIGH (ref 70–99)
Glucose-Capillary: 383 mg/dL — ABNORMAL HIGH (ref 70–99)
Glucose-Capillary: 365 mg/dL — ABNORMAL HIGH (ref 70–99)
Glucose-Capillary: 269 mg/dL — ABNORMAL HIGH (ref 70–99)
Glucose-Capillary: 197 mg/dL — ABNORMAL HIGH (ref 70–99)

## 2024-01-26 LAB — BRAIN NATRIURETIC PEPTIDE: B Natriuretic Peptide: 1313 pg/mL — ABNORMAL HIGH (ref 0.0–100.0)

## 2024-01-26 MED ORDER — INSULIN ASPART PROT & ASPART (70-30 MIX) 100 UNIT/ML ~~LOC~~ SUSP
5.0000 [IU] | Freq: Two times a day (BID) | SUBCUTANEOUS | Status: DC
  Administered 2024-01-26: 5 [IU] via SUBCUTANEOUS

## 2024-01-26 MED ORDER — ALBUMIN HUMAN 25 % IV SOLN
25.0000 g | Freq: Once | INTRAVENOUS | Status: AC
  Administered 2024-01-26: 25 g via INTRAVENOUS
  Filled 2024-01-26: qty 100

## 2024-01-26 MED ORDER — DEXTROSE 50 % IV SOLN: 25.0000 g | INTRAVENOUS | Status: DC

## 2024-01-26 MED ORDER — INSULIN ASPART PROT & ASPART (70-30 MIX) 100 UNIT/ML ~~LOC~~ SUSP
10.0000 [IU] | Freq: Two times a day (BID) | SUBCUTANEOUS | Status: DC
  Administered 2024-01-26: 10 [IU] via SUBCUTANEOUS

## 2024-01-26 MED ORDER — CLONIDINE HCL 0.2 MG PO TABS
0.3000 mg | ORAL_TABLET | Freq: Three times a day (TID) | ORAL | Status: DC
  Administered 2024-01-26 – 2024-02-01 (×19): 0.3 mg via ORAL
  Filled 2024-01-26 (×21): qty 1

## 2024-01-26 MED ORDER — FUROSEMIDE 10 MG/ML IJ SOLN
40.0000 mg | Freq: Two times a day (BID) | INTRAMUSCULAR | Status: DC
  Administered 2024-01-26: 40 mg via INTRAVENOUS
  Filled 2024-01-26: qty 4

## 2024-01-26 NOTE — Progress Notes (Signed)
 CBG of 43. Patient is asymptomatic. Peanut butter crackers and a regular soda given.  2330 CBG of 37. Provider notified for orders. Patient remains asymptomatic.  2345 One amp of d50 iv given.  2350 CBG of 220.

## 2024-01-26 NOTE — Progress Notes (Signed)
   01/26/24 0102  Provider Notification  Provider response Other (Comment) (He states day team will adjust meds)  Date of Provider Response 01/26/24  Time of Provider Response [phone]

## 2024-01-26 NOTE — Plan of Care (Signed)
 Hypoglycemic and hypertensive during the shift. Provider aware. Received one amp of d50 for a blood sugar of 37 and iv hydralazine  for sbp in the 190's.  Problem: Education: Goal: Knowledge of General Education information will improve Description: Including pain rating scale, medication(s)/side effects and non-pharmacologic comfort measures Outcome: Not Progressing   Problem: Education: Goal: Ability to describe self-care measures that may prevent or decrease complications (Diabetes Survival Skills Education) will improve Outcome: Not Progressing

## 2024-01-26 NOTE — Progress Notes (Signed)
   01/26/24 0257  Vitals  BP (!) 210/68  BP Location Right Arm  BP Method Manual  Patient Position (if appropriate) Lying  Pulse Rate 66  MEWS COLOR  MEWS Score Color Yellow  MEWS Score  MEWS Temp 0  MEWS Systolic 2  MEWS Pulse 0  MEWS RR 0  MEWS LOC 0  MEWS Score 2  Provider Notification  Provider Name/Title M. Arrien  Date Provider Notified 01/26/24  Time Provider Notified [phone]  Method of Notification Page (secure chat)  Notification Reason Other (Comment) (yellow mews, sbp 210)  Provider response Other (Comment) (awaiting orders)

## 2024-01-26 NOTE — Progress Notes (Signed)
 PROGRESS NOTE    Patient: Emily Jennings                            PCP: Towanda Fret, MD                    DOB: Nov 17, 1933            DOA: 01/22/2024 XBJ:[id number]             DOS: 01/26/2024, 9:26 AM   LOS: 4 days   Date of Service: The patient was seen and examined on 01/26/2024  Subjective:   The patient was seen and examined this morning. Blood pressure still elevated, another episode of hypoglycemia overnight blood sugars lowest 37 currently 383.     Brief Narrative:    Emily Jennings is an 89 y.o. female with medical history significant of hypertension, T2DM, CHF, CAD, CKD stage IV who presents to the emergency department due to elevated blood sugar within last 2 to 3 days. She states that she has been compliant with her insulin  as prescribed.  Patient was admitted with hyperglycemia in the setting of type 2 diabetes as well as AKI on CKD stage V.  She was also noted to be hypothermic and hyponatremic ED Course:  BP was 152/117, temperature 94.58F.  CBC showed normocytic anemia, BMP showed sodium of 123, potassium 3.0, chloride 84, bicarb 26, blood glucose 356, BUN 78, creatinine 3.70, GFR 11.  Urinalysis was positive for glycosuria, magnesium  2.3 Patient was treated with 10 units of NovoLog , IV hydration was provided.     Assessment & Plan:   Principal Problem:   Type 2 diabetes mellitus with hyperglycemia (HCC) Active Problems:   Acute kidney injury superimposed on chronic kidney disease (HCC)   Hyponatremia   Hypothermia   Hypokalemia   Mixed hyperlipidemia   Essential hypertension   Type 2 diabetes mellitus with hyperglycemia - Another episode of hypoglycemia overnight, blood sugars lowest 37 CBG (last 3) 37, 220, 383  - Long-acting insulin  Semglee  discontinued - Currently on NovoLog  70/30, reduced from 16 to 10 units twice daily -Continue monitoring CBG q. ACH S with Isai coverage, no nightly coverage   Acute kidney injury superimposed on CKD stage  V -Baseline creatinine 2.3-2.9 - Mild volume overload -Improving BUN/creatinine -Nephrology consulted, containing of the day 2  IV Lasix  40 twice daily - replacing albumin  -Monitoring I's and O's  POA: BUN/creatinine 70/3.70 (baseline creatinine 2.3-2.4) - Holding nephrotoxic, Lab Results  Component Value Date   CREATININE 2.73 (H) 01/26/2024   CREATININE 2.93 (H) 01/25/2024   CREATININE 3.09 (H) 01/24/2024    Hypothermia -resolved-  TSH normal at 2.6, improved glycemic control   Hypokalemia -monitoring and replacing, checking magnesium  level   Hyponatremia, hypovolemia -Improving Na 123, corrected sodium level based on hyperglycemia (350) is 127 Serum osmolality 290, -Serum sodium 123->>>> 130, 130 >> 134 >>130 - Continue with IV Lasix  per nephrology recommendations  Accelerated hypertension w h/o hypertension - Elevated BP continue home medication of hydralazine   - switch Coreg  to  - Labetalol  300 mg p.o. twice daily,  - Clonidine  increased to 0.3 mg p.o. 3 times daily,  - Hydralazine  100 mg p.o. 3 times daily,  - Imdur  60 mg p.o. daily  Nifedipine  and Lasix  held at this time due to acute kidney injury   Mixed hyperlipidemia Continue Crestor    Diastolic CHF (congestive heart failure) -preserved ejection fraction 40 grade  1 diastolic dysfunction Continue total input/output, daily weights  H/o Diastolic CHF.  Stable and compensated.   Last echo 04/2023 EF of 65 to 70% grade 2 DD.    ----------------------------------------------------------------------------------------------------------------------------------------------- Nutritional status:  The patient's BMI is: Body mass index is 25.45 kg/m. I agree with the assessment and plan as outlined -----------------------------------------------------------------------------------------------------------------  DVT prophylaxis:  heparin  injection 5,000 Units Start: 01/22/24 0845 SCDs Start: 01/22/24 0746   Code  Status:   Code Status: Full Code  Family Communication: No family member present at bedside- attempt will be made to update daily  -Advance care planning has been discussed.   Admission status:   Status is: Inpatient Remains inpatient appropriate because: Needing IV fluids, electrolyte replacement   Disposition: From  - home             Planning for discharge in 1 days: to home with home health  Procedures:   No admission procedures for hospital encounter.   Antimicrobials:  Anti-infectives (From admission, onward)    None        Medication:   cloNIDine   0.3 mg Oral TID   furosemide   40 mg Intravenous BID   heparin   5,000 Units Subcutaneous Q8H   hydrALAZINE   100 mg Oral TID   insulin  aspart  0-5 Units Subcutaneous QHS   insulin  aspart  0-6 Units Subcutaneous TID WC   insulin  aspart protamine- aspart  10 Units Subcutaneous BID WC   isosorbide  mononitrate  60 mg Oral Daily   labetalol   300 mg Oral BID   magnesium  oxide  400 mg Oral Daily   rosuvastatin   5 mg Oral Daily    acetaminophen  **OR** acetaminophen , ALPRAZolam , hydrALAZINE , ondansetron  **OR** ondansetron  (ZOFRAN ) IV   Objective:   Vitals:   01/26/24 0500 01/26/24 0527 01/26/24 0646 01/26/24 0850  BP:  (!) 212/75 (!) 177/70 (!) 186/76  Pulse:      Resp:      Temp:      TempSrc:      SpO2:      Weight: 59.1 kg     Height:        Intake/Output Summary (Last 24 hours) at 01/26/2024 0926 Last data filed at 01/26/2024 0534 Gross per 24 hour  Intake 720 ml  Output 1500 ml  Net -780 ml   Filed Weights   01/24/24 0500 01/25/24 0520 01/26/24 0500  Weight: 55.1 kg 55.5 kg 59.1 kg     Physical examination:         General:  AAO x 3,  cooperative, no distress;   HEENT:  Normocephalic, PERRL, otherwise with in Normal limits   Neuro:  CNII-XII intact. , normal motor and sensation, reflexes intact   Lungs:   Clear to auscultation BL, Respirations unlabored,  No wheezes / crackles  Cardio:     S1/S2, RRR, No murmure, No Rubs or Gallops   Abdomen:  Soft, non-tender, bowel sounds active all four quadrants, no guarding or peritoneal signs.  Muscular  skeletal:  Limited exam -global generalized weaknesses - in bed, able to move all 4 extremities,   2+ pulses,  symmetric, +1 pitting edema  Skin:  Dry, warm to touch, negative for any Rashes,  Wounds: Please see nursing documentation          LABs:     Latest Ref Rng & Units 01/23/2024    3:26 AM 01/22/2024    1:22 AM 07/27/2023    9:15 AM  CBC  WBC 4.0 - 10.5 K/uL 5.9  5.1  7.4   Hemoglobin 12.0 - 15.0 g/dL 16.1  9.4  9.2   Hematocrit 36.0 - 46.0 % 29.0  26.1  28.2   Platelets 150 - 400 K/uL 245  192  256       Latest Ref Rng & Units 01/26/2024    4:16 AM 01/25/2024    5:13 AM 01/24/2024    8:47 AM  CMP  Glucose 70 - 99 mg/dL 096  24  98   BUN 8 - 23 mg/dL 66  64  58   Creatinine 0.44 - 1.00 mg/dL 0.45  4.09  8.11   Sodium 135 - 145 mmol/L 130  134  130   Potassium 3.5 - 5.1 mmol/L 3.9  4.0  3.1   Chloride 98 - 111 mmol/L 94  100  94   CO2 22 - 32 mmol/L 27  29  28    Calcium  8.9 - 10.3 mg/dL 8.6  8.3  8.4        Micro Results Recent Results (from the past 240 hours)  MRSA Next Gen by PCR, Nasal     Status: None   Collection Time: 01/22/24  6:40 AM   Specimen: Nasal Mucosa; Nasal Swab  Result Value Ref Range Status   MRSA by PCR Next Gen NOT DETECTED NOT DETECTED Final    Comment: (NOTE) The GeneXpert MRSA Assay (FDA approved for NASAL specimens only), is one component of a comprehensive MRSA colonization surveillance program. It is not intended to diagnose MRSA infection nor to guide or monitor treatment for MRSA infections. Test performance is not FDA approved in patients less than 22 years old. Performed at [facility], [address] [place], [place] [address]   Resp panel by RT-PCR (RSV, Flu A&B, Covid) Anterior Nasal Swab     Status: None   Collection Time: 01/22/24 12:53 PM   Specimen: Anterior Nasal  Swab  Result Value Ref Range Status   SARS Coronavirus 2 by RT PCR NEGATIVE NEGATIVE Final    Comment: (NOTE) SARS-CoV-2 target nucleic acids are NOT DETECTED.  The SARS-CoV-2 RNA is generally detectable in upper respiratory specimens during the acute phase of infection. The lowest concentration of SARS-CoV-2 viral copies this assay can detect is 138 copies/mL. A negative result does not preclude SARS-Cov-2 infection and should not be used as the sole basis for treatment or other patient management decisions. A negative result may occur with  improper specimen collection/handling, submission of specimen other than nasopharyngeal swab, presence of viral mutation(s) within the areas targeted by this assay, and inadequate number of viral copies(<138 copies/mL). A negative result must be combined with clinical observations, patient history, and epidemiological information. The expected result is Negative.  Fact Sheet for Patients:  BloggerCourse.com  Fact Sheet for Healthcare Providers:  SeriousBroker.it  This test is no t yet approved or cleared by the [place]  FDA and  has been authorized for detection and/or diagnosis of SARS-CoV-2 by FDA under an Emergency Use Authorization (EUA). This EUA will remain  in effect (meaning this test can be used) for the duration of the COVID-19 declaration under Section 564(b)(1) of the Act, 21 U.S.C.section 360bbb-3(b)(1), unless the authorization is terminated  or revoked sooner.       Influenza A by PCR NEGATIVE NEGATIVE Final   Influenza B by PCR NEGATIVE NEGATIVE Final    Comment: (NOTE) The Xpert Xpress SARS-CoV-2/FLU/RSV plus assay is intended as an aid in the diagnosis of influenza from Nasopharyngeal swab specimens and should  not be used as a sole basis for treatment. Nasal washings and aspirates are unacceptable for Xpert Xpress SARS-CoV-2/FLU/RSV testing.  Fact Sheet for  Patients: BloggerCourse.com  Fact Sheet for Healthcare Providers: SeriousBroker.it  This test is not yet approved or cleared by the [place]  FDA and has been authorized for detection and/or diagnosis of SARS-CoV-2 by FDA under an Emergency Use Authorization (EUA). This EUA will remain in effect (meaning this test can be used) for the duration of the COVID-19 declaration under Section 564(b)(1) of the Act, 21 U.S.C. section 360bbb-3(b)(1), unless the authorization is terminated or revoked.     Resp Syncytial Virus by PCR NEGATIVE NEGATIVE Final    Comment: (NOTE) Fact Sheet for Patients: BloggerCourse.com  Fact Sheet for Healthcare Providers: SeriousBroker.it  This test is not yet approved or cleared by the [place]  FDA and has been authorized for detection and/or diagnosis of SARS-CoV-2 by FDA under an Emergency Use Authorization (EUA). This EUA will remain in effect (meaning this test can be used) for the duration of the COVID-19 declaration under Section 564(b)(1) of the Act, 21 U.S.C. section 360bbb-3(b)(1), unless the authorization is terminated or revoked.  Performed at [facility], [address], [place], [place] [address]     Radiology Reports No results found.    SIGNED: Bobbetta Burnet, MD, FHM. FAAFP. Arlin Benes - [facility] Time spent - 55 min.  In seeing, evaluating and examining the patient. Reviewing medical records, labs, drawn plan of care. [facility],  Pager (please use [email or web address] to page/ text) Please use Epic Secure Chat for non-urgent communication (7AM-7PM)  If 7PM-7AM, please contact night-coverage [email or web address], 01/26/2024, 9:26 AM

## 2024-01-27 DIAGNOSIS — Z794 Long term (current) use of insulin: Secondary | ICD-10-CM | POA: Diagnosis not present

## 2024-01-27 DIAGNOSIS — E1165 Type 2 diabetes mellitus with hyperglycemia: Secondary | ICD-10-CM | POA: Diagnosis not present

## 2024-01-27 LAB — GLUCOSE, CAPILLARY
Glucose-Capillary: 121 mg/dL — ABNORMAL HIGH (ref 70–99)
Glucose-Capillary: 128 mg/dL — ABNORMAL HIGH (ref 70–99)
Glucose-Capillary: 241 mg/dL — ABNORMAL HIGH (ref 70–99)
Glucose-Capillary: 25 mg/dL — CL (ref 70–99)
Glucose-Capillary: 300 mg/dL — ABNORMAL HIGH (ref 70–99)
Glucose-Capillary: 49 mg/dL — ABNORMAL LOW (ref 70–99)
Glucose-Capillary: 98 mg/dL (ref 70–99)

## 2024-01-27 LAB — BASIC METABOLIC PANEL WITH GFR
Anion gap: 11 (ref 5–15)
BUN: 72 mg/dL — ABNORMAL HIGH (ref 8–23)
CO2: 27 mmol/L (ref 22–32)
Calcium: 8.9 mg/dL (ref 8.9–10.3)
Chloride: 93 mmol/L — ABNORMAL LOW (ref 98–111)
Creatinine, Ser: 3.23 mg/dL — ABNORMAL HIGH (ref 0.44–1.00)
GFR, Estimated: 13 mL/min — ABNORMAL LOW (ref >60)
Glucose, Bld: 196 mg/dL — ABNORMAL HIGH (ref 70–99)
Potassium: 3.5 mmol/L (ref 3.5–5.1)
Sodium: 131 mmol/L — ABNORMAL LOW (ref 135–145)

## 2024-01-27 LAB — GLUCOSE, RANDOM: Glucose, Bld: 98 mg/dL (ref 70–99)

## 2024-01-27 MED ORDER — FUROSEMIDE 40 MG PO TABS
40.0000 mg | ORAL_TABLET | Freq: Two times a day (BID) | ORAL | Status: DC
  Administered 2024-01-27: 40 mg via ORAL
  Filled 2024-01-27: qty 1

## 2024-01-27 MED ORDER — FUROSEMIDE 40 MG PO TABS: 40.0000 mg | ORAL_TABLET | Freq: Every day | ORAL | Status: DC

## 2024-01-27 MED ORDER — FUROSEMIDE 40 MG PO TABS
40.0000 mg | ORAL_TABLET | Freq: Two times a day (BID) | ORAL | Status: DC
  Administered 2024-01-28 – 2024-01-29 (×3): 40 mg via ORAL
  Filled 2024-01-27 (×3): qty 1

## 2024-01-27 MED ORDER — LABETALOL HCL 5 MG/ML IV SOLN
20.0000 mg | Freq: Once | INTRAVENOUS | Status: AC
  Administered 2024-01-27: 20 mg via INTRAVENOUS
  Filled 2024-01-27: qty 4

## 2024-01-27 MED ORDER — INSULIN ASPART PROT & ASPART (70-30 MIX) 100 UNIT/ML ~~LOC~~ SUSP
12.0000 [IU] | Freq: Two times a day (BID) | SUBCUTANEOUS | Status: DC
  Administered 2024-01-27: 12 [IU] via SUBCUTANEOUS

## 2024-01-27 MED ORDER — POLYETHYLENE GLYCOL 3350 17 G PO PACK
17.0000 g | PACK | Freq: Every day | ORAL | Status: DC
  Administered 2024-01-27 – 2024-01-29 (×3): 17 g via ORAL
  Filled 2024-01-27 (×5): qty 1

## 2024-01-27 MED ORDER — FUROSEMIDE 10 MG/ML IJ SOLN
40.0000 mg | Freq: Two times a day (BID) | INTRAMUSCULAR | Status: AC
  Administered 2024-01-27 (×2): 40 mg via INTRAVENOUS
  Filled 2024-01-27 (×2): qty 4

## 2024-01-27 MED ORDER — DEXTROSE 50 % IV SOLN
INTRAVENOUS | Status: AC
Start: 2024-01-27 — End: 2024-01-28
  Administered 2024-01-28: 50 mL via INTRAVENOUS
  Filled 2024-01-27: qty 50

## 2024-01-27 MED ORDER — ALBUMIN HUMAN 25 % IV SOLN
25.0000 g | Freq: Once | INTRAVENOUS | Status: AC
  Administered 2024-01-27: 25 g via INTRAVENOUS
  Filled 2024-01-27: qty 100

## 2024-01-27 MED ORDER — INSULIN ASPART PROT & ASPART (70-30 MIX) 100 UNIT/ML ~~LOC~~ SUSP
14.0000 [IU] | Freq: Two times a day (BID) | SUBCUTANEOUS | Status: DC
  Administered 2024-01-27: 14 [IU] via SUBCUTANEOUS

## 2024-01-27 MED ORDER — SENNOSIDES 8.8 MG/5ML PO SYRP
15.0000 mL | ORAL_SOLUTION | Freq: Two times a day (BID) | ORAL | Status: DC
  Administered 2024-01-28 – 2024-01-31 (×4): 15 mL via ORAL
  Filled 2024-01-27 (×11): qty 15

## 2024-01-27 MED ORDER — DEXTROSE 50 % IV SOLN
25.0000 g | INTRAVENOUS | Status: AC
  Administered 2024-01-27: 25 g via INTRAVENOUS

## 2024-01-27 NOTE — Plan of Care (Signed)
   Problem: Education: Goal: Knowledge of General Education information will improve Description: Including pain rating scale, medication(s)/side effects and non-pharmacologic comfort measures Outcome: Progressing   Problem: Health Behavior/Discharge Planning: Goal: Ability to manage health-related needs will improve Outcome: Progressing   Problem: Clinical Measurements: Goal: Ability to maintain clinical measurements within normal limits will improve Outcome: Progressing Goal: Will remain free from infection Outcome: Progressing Goal: Diagnostic test results will improve Outcome: Progressing Goal: Respiratory complications will improve Outcome: Progressing Goal: Cardiovascular complication will be avoided Outcome: Progressing   Problem: Activity: Goal: Risk for activity intolerance will decrease Outcome: Progressing   Problem: Nutrition: Goal: Adequate nutrition will be maintained Outcome: Progressing   Problem: Coping: Goal: Level of anxiety will decrease Outcome: Progressing   Problem: Elimination: Goal: Will not experience complications related to bowel motility Outcome: Progressing Goal: Will not experience complications related to urinary retention Outcome: Progressing   Problem: Pain Managment: Goal: General experience of comfort will improve and/or be controlled Outcome: Progressing   Problem: Safety: Goal: Ability to remain free from injury will improve Outcome: Progressing   Problem: Skin Integrity: Goal: Risk for impaired skin integrity will decrease Outcome: Progressing   Problem: Education: Goal: Ability to describe self-care measures that may prevent or decrease complications (Diabetes Survival Skills Education) will improve Outcome: Progressing Goal: Individualized Educational Video(s) Outcome: Progressing   Problem: Coping: Goal: Ability to adjust to condition or change in health will improve Outcome: Progressing   Problem: Fluid  Volume: Goal: Ability to maintain a balanced intake and output will improve Outcome: Progressing   Problem: Health Behavior/Discharge Planning: Goal: Ability to identify and utilize available resources and services will improve Outcome: Progressing Goal: Ability to manage health-related needs will improve Outcome: Progressing   Problem: Metabolic: Goal: Ability to maintain appropriate glucose levels will improve Outcome: Progressing   Problem: Nutritional: Goal: Maintenance of adequate nutrition will improve Outcome: Progressing Goal: Progress toward achieving an optimal weight will improve Outcome: Progressing   Problem: Skin Integrity: Goal: Risk for impaired skin integrity will decrease Outcome: Progressing   Problem: Tissue Perfusion: Goal: Adequacy of tissue perfusion will improve Outcome: Progressing   Problem: Education: Goal: Ability to describe self-care measures that may prevent or decrease complications (Diabetes Survival Skills Education) will improve Outcome: Progressing Goal: Individualized Educational Video(s) Outcome: Progressing   Problem: Coping: Goal: Ability to adjust to condition or change in health will improve Outcome: Progressing   Problem: Fluid Volume: Goal: Ability to maintain a balanced intake and output will improve Outcome: Progressing   Problem: Health Behavior/Discharge Planning: Goal: Ability to identify and utilize available resources and services will improve Outcome: Progressing Goal: Ability to manage health-related needs will improve Outcome: Progressing   Problem: Metabolic: Goal: Ability to maintain appropriate glucose levels will improve Outcome: Progressing   Problem: Nutritional: Goal: Maintenance of adequate nutrition will improve Outcome: Progressing Goal: Progress toward achieving an optimal weight will improve Outcome: Progressing   Problem: Skin Integrity: Goal: Risk for impaired skin integrity will  decrease Outcome: Progressing   Problem: Tissue Perfusion: Goal: Adequacy of tissue perfusion will improve Outcome: Progressing

## 2024-01-27 NOTE — Progress Notes (Signed)
 PROGRESS NOTE    Patient: Emily Jennings                            PCP: Towanda Fret, MD                    DOB: [birth year]            DOA: 01/22/2024 RUE:[id number]             DOS: 01/27/2024, 10:16 AM   LOS: 5 days   Date of Service: The patient was seen and examined on 01/27/2024  Subjective:   The patient was seen and examined this morning, awake alert oriented sitting up in chair, accompanied by her husband No issues overnight, her blood sugar up to 300 this morning Hypoglycemic episode yesterday     Brief Narrative:    Emily Jennings is an 89 y.o. female with medical history significant of hypertension, T2DM, CHF, CAD, CKD stage IV who presents to the emergency department due to elevated blood sugar within last 2 to 3 days. She states that she has been compliant with her insulin  as prescribed.  Patient was admitted with hyperglycemia in the setting of type 2 diabetes as well as AKI on CKD stage V.  She was also noted to be hypothermic and hyponatremic ED Course:  BP was 152/117, temperature 94.45F.  CBC showed normocytic anemia, BMP showed sodium of 123, potassium 3.0, chloride 84, bicarb 26, blood glucose 356, BUN 78, creatinine 3.70, GFR 11.  Urinalysis was positive for glycosuria, magnesium  2.3 Patient was treated with 10 units of NovoLog , IV hydration was provided.     Assessment & Plan:   Principal Problem:   Type 2 diabetes mellitus with hyperglycemia (HCC) Active Problems:   Acute kidney injury superimposed on chronic kidney disease (HCC)   Hyponatremia   Hypothermia   Hypokalemia   Mixed hyperlipidemia   Essential hypertension   Type 2 diabetes mellitus with hyperglycemia - Another episode of hypoglycemia overnight, blood sugars lowest 37 CBG (last 3) 37, 220, 383, 300  - Long-acting insulin  Semglee   - Currently on NovoLog  70/30, 10>>14  units twice daily -Continue monitoring CBG q. ACH S with Isai coverage, no nightly coverage   Acute kidney injury  superimposed on CKD stage V -Baseline creatinine 2.3-2.9 - Mild volume overload -Improving BUN/creatinine -Nephrology consulted, containing of the day 2  IV Lasix  40 twice daily - replacing albumin  -Monitoring I's and O's  Intake/Output Summary (Last 24 hours) at 01/27/2024 1014 Last data filed at 01/27/2024 0900 Gross per 24 hour  Intake 1250 ml  Output 950 ml  Net 300 ml    POA: BUN/creatinine 70/3.70 (baseline creatinine 2.3-2.4) - Holding nephrotoxic, Lab Results  Component Value Date   CREATININE 2.73 (H) 01/26/2024   CREATININE 2.93 (H) 01/25/2024   CREATININE 3.09 (H) 01/24/2024    Hypothermia -resolved-  TSH normal at 2.6, improved glycemic control   Hypokalemia -monitoring and replacing, checking magnesium  level   Hyponatremia, hypovolemia -Improving Na 123, corrected sodium level based on hyperglycemia (350) is 127 Serum osmolality 290, -Serum sodium 123->>>> 130, >130, 131  - Continue with IV Lasix  per nephrology recommendations  Accelerated hypertension w h/o hypertension - Elevated BP continue home medication of hydralazine   - switch Coreg  to -- Labetalol  300 mg p.o. twice daily,  - Clonidine  increased to 0.3 mg p.o. 3 times daily,  - Hydralazine  100 mg p.o. 3 times  daily,  - Imdur  60 mg p.o. daily  Nifedipine  on hold due to acute on chronic kidney injury   Mixed hyperlipidemia Continue Crestor    Diastolic CHF (congestive heart failure)  -preserved ejection fraction 40 grade 1 diastolic dysfunction Continue total input/output, daily weights  H/o Diastolic CHF.  Stable and compensated.   Last echo 04/2023 EF of 65 to 70% grade 2 DD.     ----------------------------------------------------------------------------------------------------------------------------------------------- Nutritional status:  The patient's BMI is: Body mass index is 25.02 kg/m. I agree with the assessment and plan as outlined  -----------------------------------------------------------------------------------------------------------------  DVT prophylaxis:  heparin  injection 5,000 Units Start: 01/22/24 0845 SCDs Start: 01/22/24 0746   Code Status:   Code Status: Full Code  Family Communication: Husband present at bedside-updated  -Advance care planning has been discussed.   Admission status:   Status is: Inpatient Remains inpatient appropriate because: Needing IV fluids, electrolyte replacement   Disposition: From  - home             Planning for discharge in 1 days: to home with home health  Procedures:   No admission procedures for hospital encounter.   Antimicrobials:  Anti-infectives (From admission, onward)    None        Medication:   cloNIDine   0.3 mg Oral TID   furosemide   40 mg Oral BID   heparin   5,000 Units Subcutaneous Q8H   hydrALAZINE   100 mg Oral TID   insulin  aspart  0-5 Units Subcutaneous QHS   insulin  aspart  0-6 Units Subcutaneous TID WC   insulin  aspart protamine- aspart  14 Units Subcutaneous BID WC   isosorbide  mononitrate  60 mg Oral Daily   labetalol   300 mg Oral BID   magnesium  oxide  400 mg Oral Daily   rosuvastatin   5 mg Oral Daily    acetaminophen  **OR** acetaminophen , ALPRAZolam , hydrALAZINE , ondansetron  **OR** ondansetron  (ZOFRAN ) IV   Objective:   Vitals:   01/26/24 1937 01/26/24 2002 01/26/24 2215 01/27/24 0443  BP: (!) 209/82 (!) 216/78 (!) 206/73 (!) 162/104  Pulse: 74  70 74  Resp: 18   20  Temp: 97.6 F (36.4 C)   97.7 F (36.5 C)  TempSrc: Oral   Oral  SpO2: 100%   99%  Weight:    58.1 kg  Height:        Intake/Output Summary (Last 24 hours) at 01/27/2024 1016 Last data filed at 01/27/2024 0900 Gross per 24 hour  Intake 1250 ml  Output 950 ml  Net 300 ml   Filed Weights   01/25/24 0520 01/26/24 0500 01/27/24 0443  Weight: 55.5 kg 59.1 kg 58.1 kg     Physical examination:   General:  AAO x 3,  cooperative, no distress;    HEENT:  Normocephalic, PERRL, otherwise with in Normal limits   Neuro:  CNII-XII intact. , normal motor and sensation, reflexes intact   Lungs:   Clear to auscultation BL, Respirations unlabored,  No wheezes / crackles  Cardio:    S1/S2, RRR, No murmure, No Rubs or Gallops   Abdomen:  Soft, non-tender, bowel sounds active all four quadrants, no guarding or peritoneal signs.  Muscular  skeletal:  Limited exam -global generalized weaknesses - in bed, able to move all 4 extremities,   2+ pulses,  symmetric, +2 pitting edema  Skin:  Dry, warm to touch, negative for any Rashes,  Wounds: Please see nursing documentation        LABs:     Latest Ref Rng &  Units 01/23/2024    3:26 AM 01/22/2024    1:22 AM 07/27/2023    9:15 AM  CBC  WBC 4.0 - 10.5 K/uL 5.9  5.1  7.4   Hemoglobin 12.0 - 15.0 g/dL 16.1  9.4  9.2   Hematocrit 36.0 - 46.0 % 29.0  26.1  28.2   Platelets 150 - 400 K/uL 245  192  256       Latest Ref Rng & Units 01/27/2024    4:13 AM 01/26/2024    4:16 AM 01/25/2024    5:13 AM  CMP  Glucose 70 - 99 mg/dL 096  045  24   BUN 8 - 23 mg/dL 72  66  64   Creatinine 0.44 - 1.00 mg/dL 4.09  8.11  9.14   Sodium 135 - 145 mmol/L 131  130  134   Potassium 3.5 - 5.1 mmol/L 3.5  3.9  4.0   Chloride 98 - 111 mmol/L 93  94  100   CO2 22 - 32 mmol/L 27  27  29    Calcium  8.9 - 10.3 mg/dL 8.9  8.6  8.3        Micro Results Recent Results (from the past 240 hours)  MRSA Next Gen by PCR, Nasal     Status: None   Collection Time: 01/22/24  6:40 AM   Specimen: Nasal Mucosa; Nasal Swab  Result Value Ref Range Status   MRSA by PCR Next Gen NOT DETECTED NOT DETECTED Final    Comment: (NOTE) The GeneXpert MRSA Assay (FDA approved for NASAL specimens only), is one component of a comprehensive MRSA colonization surveillance program. It is not intended to diagnose MRSA infection nor to guide or monitor treatment for MRSA infections. Test performance is not FDA approved in patients less than  49 years old. Performed at [facility], [address] [place], [place] [address]   Resp panel by RT-PCR (RSV, Flu A&B, Covid) Anterior Nasal Swab     Status: None   Collection Time: 01/22/24 12:53 PM   Specimen: Anterior Nasal Swab  Result Value Ref Range Status   SARS Coronavirus 2 by RT PCR NEGATIVE NEGATIVE Final    Comment: (NOTE) SARS-CoV-2 target nucleic acids are NOT DETECTED.  The SARS-CoV-2 RNA is generally detectable in upper respiratory specimens during the acute phase of infection. The lowest concentration of SARS-CoV-2 viral copies this assay can detect is 138 copies/mL. A negative result does not preclude SARS-Cov-2 infection and should not be used as the sole basis for treatment or other patient management decisions. A negative result may occur with  improper specimen collection/handling, submission of specimen other than nasopharyngeal swab, presence of viral mutation(s) within the areas targeted by this assay, and inadequate number of viral copies(<138 copies/mL). A negative result must be combined with clinical observations, patient history, and epidemiological information. The expected result is Negative.  Fact Sheet for Patients:  BloggerCourse.com  Fact Sheet for Healthcare Providers:  SeriousBroker.it  This test is no t yet approved or cleared by the [place]  FDA and  has been authorized for detection and/or diagnosis of SARS-CoV-2 by FDA under an Emergency Use Authorization (EUA). This EUA will remain  in effect (meaning this test can be used) for the duration of the COVID-19 declaration under Section 564(b)(1) of the Act, 21 U.S.C.section 360bbb-3(b)(1), unless the authorization is terminated  or revoked sooner.       Influenza A by PCR NEGATIVE NEGATIVE Final   Influenza B by PCR NEGATIVE  NEGATIVE Final    Comment: (NOTE) The Xpert Xpress SARS-CoV-2/FLU/RSV plus assay is intended as an aid in  the diagnosis of influenza from Nasopharyngeal swab specimens and should not be used as a sole basis for treatment. Nasal washings and aspirates are unacceptable for Xpert Xpress SARS-CoV-2/FLU/RSV testing.  Fact Sheet for Patients: BloggerCourse.com  Fact Sheet for Healthcare Providers: SeriousBroker.it  This test is not yet approved or cleared by the [place]  FDA and has been authorized for detection and/or diagnosis of SARS-CoV-2 by FDA under an Emergency Use Authorization (EUA). This EUA will remain in effect (meaning this test can be used) for the duration of the COVID-19 declaration under Section 564(b)(1) of the Act, 21 U.S.C. section 360bbb-3(b)(1), unless the authorization is terminated or revoked.     Resp Syncytial Virus by PCR NEGATIVE NEGATIVE Final    Comment: (NOTE) Fact Sheet for Patients: BloggerCourse.com  Fact Sheet for Healthcare Providers: SeriousBroker.it  This test is not yet approved or cleared by the [place]  FDA and has been authorized for detection and/or diagnosis of SARS-CoV-2 by FDA under an Emergency Use Authorization (EUA). This EUA will remain in effect (meaning this test can be used) for the duration of the COVID-19 declaration under Section 564(b)(1) of the Act, 21 U.S.C. section 360bbb-3(b)(1), unless the authorization is terminated or revoked.  Performed at [facility], [address] [place], [place] [address]     Radiology Reports No results found.    SIGNED: Bobbetta Burnet, MD, FHM. FAAFP. Arlin Benes - [facility] Time spent - 55 min.  In seeing, evaluating and examining the patient. Reviewing medical records, labs, drawn plan of care. [facility],  Pager (please use [email or web address] to page/ text) Please use Epic Secure Chat for non-urgent communication (7AM-7PM)  If 7PM-7AM, please contact  night-coverage [email or web address], 01/27/2024, 10:16 AM

## 2024-01-28 DIAGNOSIS — Z794 Long term (current) use of insulin: Secondary | ICD-10-CM | POA: Diagnosis not present

## 2024-01-28 DIAGNOSIS — E1165 Type 2 diabetes mellitus with hyperglycemia: Secondary | ICD-10-CM | POA: Diagnosis not present

## 2024-01-28 LAB — BASIC METABOLIC PANEL WITH GFR
Anion gap: 11 (ref 5–15)
BUN: 72 mg/dL — ABNORMAL HIGH (ref 8–23)
CO2: 29 mmol/L (ref 22–32)
Calcium: 9.1 mg/dL (ref 8.9–10.3)
Chloride: 92 mmol/L — ABNORMAL LOW (ref 98–111)
Creatinine, Ser: 3.17 mg/dL — ABNORMAL HIGH (ref 0.44–1.00)
GFR, Estimated: 14 mL/min — ABNORMAL LOW (ref >60)
Glucose, Bld: 115 mg/dL — ABNORMAL HIGH (ref 70–99)
Potassium: 2.9 mmol/L — ABNORMAL LOW (ref 3.5–5.1)
Sodium: 132 mmol/L — ABNORMAL LOW (ref 135–145)

## 2024-01-28 LAB — GLUCOSE, CAPILLARY
Glucose-Capillary: 155 mg/dL — ABNORMAL HIGH (ref 70–99)
Glucose-Capillary: 45 mg/dL — ABNORMAL LOW (ref 70–99)
Glucose-Capillary: 151 mg/dL — ABNORMAL HIGH (ref 70–99)
Glucose-Capillary: 515 mg/dL (ref 70–99)
Glucose-Capillary: 392 mg/dL — ABNORMAL HIGH (ref 70–99)
Glucose-Capillary: 245 mg/dL — ABNORMAL HIGH (ref 70–99)
Glucose-Capillary: 141 mg/dL — ABNORMAL HIGH (ref 70–99)

## 2024-01-28 MED ORDER — POTASSIUM CHLORIDE CRYS ER 20 MEQ PO TBCR
40.0000 meq | EXTENDED_RELEASE_TABLET | Freq: Two times a day (BID) | ORAL | Status: AC
  Administered 2024-01-28 (×2): 40 meq via ORAL
  Filled 2024-01-28 (×2): qty 2

## 2024-01-28 MED ORDER — INSULIN ASPART PROT & ASPART (70-30 MIX) 100 UNIT/ML ~~LOC~~ SUSP
10.0000 [IU] | Freq: Every day | SUBCUTANEOUS | Status: DC
Start: 2024-01-28 — End: 2024-01-28

## 2024-01-28 MED ORDER — INSULIN ASPART 100 UNIT/ML IJ SOLN
15.0000 [IU] | Freq: Once | INTRAMUSCULAR | Status: AC
Start: 2024-01-28 — End: 2024-01-28
  Administered 2024-01-28: 15 [IU] via SUBCUTANEOUS

## 2024-01-28 MED ORDER — INSULIN ASPART PROT & ASPART (70-30 MIX) 100 UNIT/ML ~~LOC~~ SUSP
15.0000 [IU] | Freq: Every day | SUBCUTANEOUS | Status: DC
  Administered 2024-01-29: 15 [IU] via SUBCUTANEOUS

## 2024-01-28 MED ORDER — DEXTROSE 50 % IV SOLN
INTRAVENOUS | Status: AC
  Filled 2024-01-28: qty 50

## 2024-01-28 NOTE — Progress Notes (Signed)
 Mobility Specialist Progress Note:    01/28/24 1030  Mobility  Activity Transferred from chair to bed  Level of Assistance Minimal assist, patient does 75% or more  Assistive Device None  Distance Ambulated (ft) 3 ft  Range of Motion/Exercises Active;All extremities  Activity Response Tolerated well  Mobility Referral Yes  Mobility visit 1 Mobility  Mobility Specialist Start Time (ACUTE ONLY) 1010  Mobility Specialist Stop Time (ACUTE ONLY) 1030  Mobility Specialist Time Calculation (min) (ACUTE ONLY) 20 min   Pt received in chair requesting assistance to bed. Required MinA to stand and transfer with no AD. Tolerated well, asx throughout. Left pt supine, alarm on. All needs met.   Glinda Lapping Mobility Specialist Please contact via Special educational needs teacher or  Rehab office at [phone]

## 2024-01-28 NOTE — Progress Notes (Signed)
 [facility] Progress Note    Assessment/ Plan:   # Acute kidney injury on CKD stage IV with chronic proteinuria likely due to acute CHF exacerbation/hypertensive urgency. Baseline Cr seems to be around 2.3-2.9. Followed by Dr. Carrolyn Clan as outpatient. No obstruction on u/s. Peak Cr 3.7. 3.17 today, nonoliguric -s/p IV lasix , now receiving lasix  40mg  BID PO (to start today) -no indication for renal replacement therapy -avoid RAAS inhibition or SGLT2 inhibition initiation -Avoid nephrotoxic medications including NSAIDs and iodinated intravenous contrast exposure unless the latter is absolutely indicated.  Preferred narcotic agents for pain control are hydromorphone, fentanyl , and methadone. Morphine  should not be used. Avoid Baclofen and avoid oral sodium phosphate  and magnesium  citrate based laxatives / bowel preps. Continue strict Input and Output monitoring. Will monitor the patient closely with you and intervene or adjust therapy as indicated by changes in clinical status/labs    # Hypertensive urgency: Agree with continuing current multiple antihypertensives.  IV diuretics will help offload the fluid as well.  Avoid sudden drop in BP. -BP improved this AM. If needed, can consider doxazosin  as next line agent   # Type II DM with hyperglycemia and hypoglycemia: Adjustment of insulin  by primary team.   # Acute on chronic diastolic CHF with fluid overload: Echo with preserved EF however has grade 2 diastolic dysfunction.  Diuretics as above   # Hyponatremia, hypervolemic: on diuretics.  Continue with fluid restriction. Follow  #Hypokalemia: to receive kcl po 40 meq X 2 doses. Agreed, caution w/ repletion  Subjective:   Patient seen and examined bedside. UOP: 1.3L + 1 unmeasured void. Weight down to 57.7kg today No complaints this AM except for episodes of hypoglycemia. She reports that her swelling is a little better, always has some swelling. Denies any chest pain, SOB,  N/V/dysgeusia, loss of appetite.   Objective:   BP (!) 156/93 (BP Location: Left Arm)   Pulse 65   Temp (!) 97.3 F (36.3 C) (Oral)   Resp 18   Ht 5' (1.524 m)   Wt 57.7 kg   SpO2 97%   BMI 24.84 kg/m   Intake/Output Summary (Last 24 hours) at 01/28/2024 0753 Last data filed at 01/27/2024 2225 Gross per 24 hour  Intake 720 ml  Output 1300 ml  Net -580 ml   Weight change: -0.4 kg  Physical Exam: Gen: NAD, sitting in chair CVS: RRR Resp: CTA B/L Abd: soft Ext: 1+ pitting edema b/l Les Neuro: awake, alert, hard of hearing  Imaging: No results found.  Labs: BMET Recent Labs  Lab 01/22/24 0122 01/23/24 0326 01/24/24 1610 01/25/24 0513 01/26/24 0416 01/27/24 0413 01/27/24 2206 01/28/24 0348  NA 123* 130* 130* 134* 130* 131*  --  132*  K 3.0* 3.6 3.1* 4.0 3.9 3.5  --  2.9*  CL 84* 94* 94* 100 94* 93*  --  92*  CO2 26 26 28 29 27 27   --  29  GLUCOSE 356* 227* 98 24* 282* 196* 98 115*  BUN 70* 63* 58* 64* 66* 72*  --  72*  CREATININE 3.70* 3.29* 3.09* 2.93* 2.73* 3.23*  --  3.17*  CALCIUM  8.5* 8.4* 8.4* 8.3* 8.6* 8.9  --  9.1  PHOS  --  3.5  --   --   --   --   --   --    CBC Recent Labs  Lab 01/22/24 0122 01/23/24 0326  WBC 5.1 5.9  NEUTROABS 3.7  --   HGB 9.4* 10.0*  HCT  26.1* 29.0*  MCV 89.7 92.1  PLT 192 245    Medications:     cloNIDine   0.3 mg Oral TID   dextrose        furosemide   40 mg Oral BID   heparin   5,000 Units Subcutaneous Q8H   hydrALAZINE   100 mg Oral TID   insulin  aspart  0-6 Units Subcutaneous TID WC   insulin  aspart protamine- aspart  10 Units Subcutaneous Q breakfast   isosorbide  mononitrate  60 mg Oral Daily   labetalol   300 mg Oral BID   magnesium  oxide  400 mg Oral Daily   polyethylene glycol  17 g Oral Daily   potassium chloride   40 mEq Oral BID   rosuvastatin   5 mg Oral Daily   sennosides  15 mL Oral BID      Cristi Donalds, MD [facility] 01/28/2024, 7:53 AM

## 2024-01-28 NOTE — Plan of Care (Signed)
   Problem: Education: Goal: Knowledge of General Education information will improve Description: Including pain rating scale, medication(s)/side effects and non-pharmacologic comfort measures Outcome: Not Progressing   Problem: Health Behavior/Discharge Planning: Goal: Ability to manage health-related needs will improve Outcome: Not Progressing

## 2024-01-28 NOTE — Progress Notes (Signed)
 PROGRESS NOTE    Patient: Emily Jennings                            PCP: Towanda Fret, MD                    DOB: 28-Oct-1933            DOA: 01/22/2024 ZOX:[id number]             DOS: 01/28/2024, 11:48 AM   LOS: 6 days   Date of Service: The patient was seen and examined on 01/28/2024  Subjective:   The patient was seen and examined this morning, stable no acute distress sitting up in chair--stating she does not feel well to go home yet  Labile blood sugars -overnight: As low as 155, currently 515 Potassium low at 2.9 elevated creatinine of 3.17     Brief Narrative:    Emily Jennings is an 89 y.o. female with medical history significant of hypertension, T2DM, CHF, CAD, CKD stage IV who presents to the emergency department due to elevated blood sugar within last 2 to 3 days. She states that she has been compliant with her insulin  as prescribed.  Patient was admitted with hyperglycemia in the setting of type 2 diabetes as well as AKI on CKD stage V.  She was also noted to be hypothermic and hyponatremic ED Course:  BP was 152/117, temperature 94.54F.  CBC showed normocytic anemia, BMP showed sodium of 123, potassium 3.0, chloride 84, bicarb 26, blood glucose 356, BUN 78, creatinine 3.70, GFR 11.  Urinalysis was positive for glycosuria, magnesium  2.3 Patient was treated with 10 units of NovoLog , IV hydration was provided.     Assessment & Plan:   Principal Problem:   Type 2 diabetes mellitus with hyperglycemia (HCC) Active Problems:   Acute kidney injury superimposed on chronic kidney disease (HCC)   Hyponatremia   Hypothermia   Hypokalemia   Mixed hyperlipidemia   Essential hypertension   Type 2 diabetes mellitus with hyperglycemia - Another episode of hypoglycemia overnight, blood sugars lowest 45 Labile blood sugars: As low as 45 overnight currently 515  - Holding-long-acting insulin  Semglee   - Currently on NovoLog  70/30, changing it from twice daily to daily - to  every morning 14 units - Continuing SSI coverage   Acute kidney injury superimposed on CKD stage V -Baseline creatinine 2.3-2.9 - Mild volume overload - IV Lasix  40 mg switch to p.o. 40 mg twice daily -Nephrology consulted, following closely - S/p placement of albumin  -Monitoring I's and O's  Intake/Output Summary (Last 24 hours) at 01/28/2024 1147 Last data filed at 01/27/2024 2225 Gross per 24 hour  Intake 480 ml  Output 1100 ml  Net -620 ml    POA: BUN/creatinine 70/3.70 (baseline creatinine 2.3-2.4) - Holding nephrotoxic, Lab Results  Component Value Date   CREATININE 3.17 (H) 01/28/2024   CREATININE 3.23 (H) 01/27/2024   CREATININE 2.73 (H) 01/26/2024    Hypothermia -resolved-  TSH normal at 2.6, improved glycemic control   Hypokalemia -monitoring, once again as low as 2.9, repleted with 40 KCl twice daily x 2 doses-checking and replacing magnesium    Hyponatremia, hypovolemia -Improving Na 123, corrected sodium level based on hyperglycemia (350) is 127 Serum osmolality 290, -Serum sodium 123->>>> 130, >130, 132 - Changed IV Lasix  to p.o.  Accelerated hypertension w h/o hypertension - Elevated BP -improving  - switch Coreg  to -- Labetalol  300  mg p.o. twice daily,  - Clonidine  increased to 0.3 mg p.o. 3 times daily,  - Hydralazine  100 mg p.o. 3 times daily,  - Imdur  60 mg p.o. daily  Nifedipine  on hold due to acute on chronic kidney injury   Mixed hyperlipidemia Continue Crestor    Diastolic CHF (congestive heart failure)  -preserved ejection fraction 40 grade 1 diastolic dysfunction Continue total input/output, daily weights  H/o Diastolic CHF.  Stable and compensated.   Last echo 04/2023 EF of 65 to 70% grade 2 DD.     ----------------------------------------------------------------------------------------------------------------------------------------------- Nutritional status:  The patient's BMI is: Body mass index is 24.84 kg/m. I agree with the  assessment and plan as outlined -----------------------------------------------------------------------------------------------------------------  DVT prophylaxis:  heparin  injection 5,000 Units Start: 01/22/24 0845 SCDs Start: 01/22/24 0746   Code Status:   Code Status: Full Code  Family Communication: Husband present at bedside-updated  -Advance care planning has been discussed.   Admission status:   Status is: Inpatient Remains inpatient appropriate because: Needing IV fluids, electrolyte replacement   Disposition: From  - home             Planning for discharge in 1 days: to home with home health  Procedures:   No admission procedures for hospital encounter.   Antimicrobials:  Anti-infectives (From admission, onward)    None        Medication:   cloNIDine   0.3 mg Oral TID   dextrose        furosemide   40 mg Oral BID   heparin   5,000 Units Subcutaneous Q8H   hydrALAZINE   100 mg Oral TID   insulin  aspart  0-6 Units Subcutaneous TID WC   insulin  aspart  15 Units Subcutaneous Once   [START ON 01/29/2024] insulin  aspart protamine- aspart  15 Units Subcutaneous Q breakfast   isosorbide  mononitrate  60 mg Oral Daily   labetalol   300 mg Oral BID   magnesium  oxide  400 mg Oral Daily   polyethylene glycol  17 g Oral Daily   potassium chloride   40 mEq Oral BID   rosuvastatin   5 mg Oral Daily   sennosides  15 mL Oral BID    acetaminophen  **OR** acetaminophen , ALPRAZolam , dextrose , hydrALAZINE , ondansetron  **OR** ondansetron  (ZOFRAN ) IV   Objective:   Vitals:   01/27/24 2330 01/28/24 0231 01/28/24 0500 01/28/24 0634  BP: (!) 181/74 (!) 198/73  (!) 156/93  Pulse: 62 62  65  Resp: 18 18  18   Temp:  (!) 97.3 F (36.3 C)    TempSrc:  Oral    SpO2: 100% 100%  97%  Weight:   57.7 kg   Height:        Intake/Output Summary (Last 24 hours) at 01/28/2024 1148 Last data filed at 01/27/2024 2225 Gross per 24 hour  Intake 480 ml  Output 1100 ml  Net -620 ml   Filed  Weights   01/26/24 0500 01/27/24 0443 01/28/24 0500  Weight: 59.1 kg 58.1 kg 57.7 kg     Physical examination:        General:  AAO x 3,  cooperative, no distress;   HEENT:  Normocephalic, PERRL, otherwise with in Normal limits   Neuro:  CNII-XII intact. , normal motor and sensation, reflexes intact   Lungs:   Clear to auscultation BL, Respirations unlabored,  No wheezes / crackles  Cardio:    S1/S2, RRR, No murmure, No Rubs or Gallops   Abdomen:  Soft, non-tender, bowel sounds active all four quadrants, no guarding or peritoneal  signs.  Muscular  skeletal:  Limited exam -global generalized weaknesses - in bed, able to move all 4 extremities,   2+ pulses,  symmetric, No pitting edema  Skin:  Dry, warm to touch, negative for any Rashes,  Wounds: Please see nursing documentation           LABs:     Latest Ref Rng & Units 01/23/2024    3:26 AM 01/22/2024    1:22 AM 07/27/2023    9:15 AM  CBC  WBC 4.0 - 10.5 K/uL 5.9  5.1  7.4   Hemoglobin 12.0 - 15.0 g/dL 56.3  9.4  9.2   Hematocrit 36.0 - 46.0 % 29.0  26.1  28.2   Platelets 150 - 400 K/uL 245  192  256       Latest Ref Rng & Units 01/28/2024    3:48 AM 01/27/2024   10:06 PM 01/27/2024    4:13 AM  CMP  Glucose 70 - 99 mg/dL 875  98  643   BUN 8 - 23 mg/dL 72   72   Creatinine 3.29 - 1.00 mg/dL 5.18   8.41   Sodium 660 - 145 mmol/L 132   131   Potassium 3.5 - 5.1 mmol/L 2.9   3.5   Chloride 98 - 111 mmol/L 92   93   CO2 22 - 32 mmol/L 29   27   Calcium  8.9 - 10.3 mg/dL 9.1   8.9        Micro Results Recent Results (from the past 240 hours)  MRSA Next Gen by PCR, Nasal     Status: None   Collection Time: 01/22/24  6:40 AM   Specimen: Nasal Mucosa; Nasal Swab  Result Value Ref Range Status   MRSA by PCR Next Gen NOT DETECTED NOT DETECTED Final    Comment: (NOTE) The GeneXpert MRSA Assay (FDA approved for NASAL specimens only), is one component of a comprehensive MRSA colonization surveillance program. It is  not intended to diagnose MRSA infection nor to guide or monitor treatment for MRSA infections. Test performance is not FDA approved in patients less than 73 years old. Performed at [facility], [address], [place], [place] [address]   Resp panel by RT-PCR (RSV, Flu A&B, Covid) Anterior Nasal Swab     Status: None   Collection Time: 01/22/24 12:53 PM   Specimen: Anterior Nasal Swab  Result Value Ref Range Status   SARS Coronavirus 2 by RT PCR NEGATIVE NEGATIVE Final    Comment: (NOTE) SARS-CoV-2 target nucleic acids are NOT DETECTED.  The SARS-CoV-2 RNA is generally detectable in upper respiratory specimens during the acute phase of infection. The lowest concentration of SARS-CoV-2 viral copies this assay can detect is 138 copies/mL. A negative result does not preclude SARS-Cov-2 infection and should not be used as the sole basis for treatment or other patient management decisions. A negative result may occur with  improper specimen collection/handling, submission of specimen other than nasopharyngeal swab, presence of viral mutation(s) within the areas targeted by this assay, and inadequate number of viral copies(<138 copies/mL). A negative result must be combined with clinical observations, patient history, and epidemiological information. The expected result is Negative.  Fact Sheet for Patients:  BloggerCourse.com  Fact Sheet for Healthcare Providers:  SeriousBroker.it  This test is no t yet approved or cleared by the [place]  FDA and  has been authorized for detection and/or diagnosis of SARS-CoV-2 by FDA under an Emergency Use Authorization (EUA). This  EUA will remain  in effect (meaning this test can be used) for the duration of the COVID-19 declaration under Section 564(b)(1) of the Act, 21 U.S.C.section 360bbb-3(b)(1), unless the authorization is terminated  or revoked sooner.       Influenza A by PCR NEGATIVE  NEGATIVE Final   Influenza B by PCR NEGATIVE NEGATIVE Final    Comment: (NOTE) The Xpert Xpress SARS-CoV-2/FLU/RSV plus assay is intended as an aid in the diagnosis of influenza from Nasopharyngeal swab specimens and should not be used as a sole basis for treatment. Nasal washings and aspirates are unacceptable for Xpert Xpress SARS-CoV-2/FLU/RSV testing.  Fact Sheet for Patients: BloggerCourse.com  Fact Sheet for Healthcare Providers: SeriousBroker.it  This test is not yet approved or cleared by the [place]  FDA and has been authorized for detection and/or diagnosis of SARS-CoV-2 by FDA under an Emergency Use Authorization (EUA). This EUA will remain in effect (meaning this test can be used) for the duration of the COVID-19 declaration under Section 564(b)(1) of the Act, 21 U.S.C. section 360bbb-3(b)(1), unless the authorization is terminated or revoked.     Resp Syncytial Virus by PCR NEGATIVE NEGATIVE Final    Comment: (NOTE) Fact Sheet for Patients: BloggerCourse.com  Fact Sheet for Healthcare Providers: SeriousBroker.it  This test is not yet approved or cleared by the [place]  FDA and has been authorized for detection and/or diagnosis of SARS-CoV-2 by FDA under an Emergency Use Authorization (EUA). This EUA will remain in effect (meaning this test can be used) for the duration of the COVID-19 declaration under Section 564(b)(1) of the Act, 21 U.S.C. section 360bbb-3(b)(1), unless the authorization is terminated or revoked.  Performed at [facility], [address] [place], [place] [address]     Radiology Reports No results found.    SIGNED: Bobbetta Burnet, MD, FHM. FAAFP. Arlin Benes - [facility] Time spent - 55 min.  In seeing, evaluating and examining the patient. Reviewing medical records, labs, drawn plan of care. [facility],  Pager  (please use [email or web address] to page/ text) Please use Epic Secure Chat for non-urgent communication (7AM-7PM)  If 7PM-7AM, please contact night-coverage [email or web address], 01/28/2024, 11:48 AM

## 2024-01-28 NOTE — Progress Notes (Signed)
 Mobility Specialist Progress Note:    01/28/24 1614  Mobility  Activity Transferred to/from [facility]  Level of Assistance Minimal assist, patient does 75% or more  Assistive Device Front wheel walker  Distance Ambulated (ft) 3 ft  Range of Motion/Exercises Active;All extremities  Activity Response Tolerated well  Mobility Referral Yes  Mobility visit 1 Mobility  Mobility Specialist Start Time (ACUTE ONLY) 1555  Mobility Specialist Stop Time (ACUTE ONLY) 1614  Mobility Specialist Time Calculation (min) (ACUTE ONLY) 19 min   Pt received on BSC ready  to transfer to chair. Assisted NT during necessary peri care. Required MinA to stand and transfer with RW. Tolerated well, asx throughout. Call bell in reach, all needs met.  Glinda Lapping Mobility Specialist Please contact via Special educational needs teacher or  Rehab office at [phone]

## 2024-01-29 ENCOUNTER — Encounter (HOSPITAL_COMMUNITY): Payer: Self-pay | Admitting: Internal Medicine

## 2024-01-29 DIAGNOSIS — E1165 Type 2 diabetes mellitus with hyperglycemia: Secondary | ICD-10-CM | POA: Diagnosis not present

## 2024-01-29 DIAGNOSIS — N179 Acute kidney failure, unspecified: Secondary | ICD-10-CM | POA: Diagnosis not present

## 2024-01-29 DIAGNOSIS — Z7189 Other specified counseling: Secondary | ICD-10-CM | POA: Diagnosis not present

## 2024-01-29 DIAGNOSIS — Z515 Encounter for palliative care: Secondary | ICD-10-CM

## 2024-01-29 DIAGNOSIS — Z794 Long term (current) use of insulin: Secondary | ICD-10-CM | POA: Diagnosis not present

## 2024-01-29 LAB — BASIC METABOLIC PANEL WITH GFR
Anion gap: 12 (ref 5–15)
BUN: 82 mg/dL — ABNORMAL HIGH (ref 8–23)
CO2: 26 mmol/L (ref 22–32)
Calcium: 9.3 mg/dL (ref 8.9–10.3)
Chloride: 93 mmol/L — ABNORMAL LOW (ref 98–111)
Creatinine, Ser: 3.6 mg/dL — ABNORMAL HIGH (ref 0.44–1.00)
GFR, Estimated: 12 mL/min — ABNORMAL LOW (ref >60)
Glucose, Bld: 290 mg/dL — ABNORMAL HIGH (ref 70–99)
Potassium: 4.6 mmol/L (ref 3.5–5.1)
Sodium: 131 mmol/L — ABNORMAL LOW (ref 135–145)

## 2024-01-29 LAB — GLUCOSE, CAPILLARY
Glucose-Capillary: 290 mg/dL — ABNORMAL HIGH (ref 70–99)
Glucose-Capillary: 367 mg/dL — ABNORMAL HIGH (ref 70–99)
Glucose-Capillary: 316 mg/dL — ABNORMAL HIGH (ref 70–99)
Glucose-Capillary: 104 mg/dL — ABNORMAL HIGH (ref 70–99)
Glucose-Capillary: 68 mg/dL — ABNORMAL LOW (ref 70–99)
Glucose-Capillary: 103 mg/dL — ABNORMAL HIGH (ref 70–99)

## 2024-01-29 MED ORDER — TORSEMIDE 20 MG PO TABS: 40.0000 mg | ORAL_TABLET | Freq: Every day | ORAL | Status: DC

## 2024-01-29 MED ORDER — TORSEMIDE 20 MG PO TABS
40.0000 mg | ORAL_TABLET | Freq: Two times a day (BID) | ORAL | Status: DC
  Administered 2024-01-30 – 2024-02-01 (×5): 40 mg via ORAL
  Filled 2024-01-29 (×5): qty 2

## 2024-01-29 MED ORDER — SPIRONOLACTONE 25 MG PO TABS
25.0000 mg | ORAL_TABLET | Freq: Every day | ORAL | Status: DC
  Administered 2024-01-29 – 2024-02-01 (×4): 25 mg via ORAL
  Filled 2024-01-29 (×4): qty 1

## 2024-01-29 MED ORDER — AMLODIPINE BESYLATE 5 MG PO TABS: 5.0000 mg | ORAL_TABLET | Freq: Every day | ORAL | Status: DC

## 2024-01-29 MED ORDER — INSULIN ASPART PROT & ASPART (70-30 MIX) 100 UNIT/ML ~~LOC~~ SUSP
20.0000 [IU] | Freq: Every day | SUBCUTANEOUS | Status: DC
Start: 2024-01-30 — End: 2024-01-30
  Administered 2024-01-30: 20 [IU] via SUBCUTANEOUS

## 2024-01-29 MED ORDER — TORSEMIDE 20 MG PO TABS: 40.0000 mg | ORAL_TABLET | Freq: Two times a day (BID) | ORAL | Status: DC

## 2024-01-29 MED ORDER — GLUCOSE 40 % PO GEL
1.0000 | ORAL | Status: AC
  Administered 2024-01-29: 31 g via ORAL
  Filled 2024-01-29: qty 1.21

## 2024-01-29 MED ORDER — AMLODIPINE BESYLATE 5 MG PO TABS
10.0000 mg | ORAL_TABLET | Freq: Every day | ORAL | Status: DC
  Administered 2024-01-29 – 2024-02-01 (×4): 10 mg via ORAL
  Filled 2024-01-29 (×4): qty 2

## 2024-01-29 NOTE — Progress Notes (Signed)
 Mobility Specialist Progress Note:    01/29/24 1602  Mobility  Activity Transferred to/from [facility];Transferred from chair to bed  Level of Assistance Minimal assist, patient does 75% or more  Assistive Device None;BSC  Distance Ambulated (ft) 7 ft  Range of Motion/Exercises Active;All extremities  Activity Response Tolerated well  Mobility Referral Yes  Mobility visit 1 Mobility  Mobility Specialist Start Time (ACUTE ONLY) 1525  Mobility Specialist Stop Time (ACUTE ONLY) 1545  Mobility Specialist Time Calculation (min) (ACUTE ONLY) 20 min   Pt received in chair requesting assistance to [facility]. Required MinA to stand and transfer with no AD. Tolerated well, asx throughout. Left pt in bed, alarm on and all needs met.  Glinda Lapping Mobility Specialist Please contact via Special educational needs teacher or  Rehab office at [phone]

## 2024-01-29 NOTE — Consult Note (Signed)
 Consultation Note Date: 01/29/2024   Patient Name: Emily Jennings  DOB: Jun 06, 1934  MRN: [id number]  Age / Sex: 89 y.o., female  PCP: Towanda Fret, MD Referring Physician: Bobbetta Burnet, MD  Reason for Consultation: Establishing goals of care  HPI/Patient Profile: 89 y.o. female  with past medical history of hypertension, T2DM, CHF, CAD, CKD stage IV admitted on 01/22/2024 with DM 2 with hyperglycemia and acute kidney injury on CKD.   Clinical Assessment and Goals of Care: I have reviewed medical records including EPIC notes, labs and imaging, received report from RN, assessed the patient.  Emily Jennings is sitting up in the Franklin chair in her room.  She appears chronically ill and frail, elderly.  She greets me, making and somewhat keeping eye contact.  She is alert, oriented x 3 if given enough time.  I do believe that she can make her basic needs known.  There is no family at bedside at this time.  Emily Jennings tells me that she lives in her own home with her husband.  She tells me that her adult son also lives in their home.  She is unable to state whether her son is disabled or retired.  Emily Jennings declines to continue our discussions.  Face-to-face conference with bedside nursing staff related to patient condition, needs.  Call to husband, Emily Jennings at home number.  No answer, left voicemail message.  Conference with attending, bedside nursing staff, transition of care team related to patient condition, needs, goals of care, disposition.   HCPOA  NEXT OF KIN -husband, Emily Jennings    SUMMARY OF RECOMMENDATIONS   Continue full scope/full code Time for outcomes Home with home health    Code Status/Advance Care Planning: Full code  Symptom Management:  Per hospitalist/nephrologist, no additional needs at this time.  Palliative Prophylaxis:  Frequent Pain Assessment and Oral Care  Additional  Recommendations (Limitations, Scope, Preferences): Full Scope Treatment  Psycho-social/Spiritual:  Desire for further Chaplaincy support:no Additional Recommendations: Caregiving  Support/Resources  Prognosis:  Unable to determine, guarded at this point.  6 months or less would not be surprising based on advanced age and chronic illness burden.  Discharge Planning: To be determined, anticipate home with home health if needed      Primary Diagnoses: Present on Admission:  Type 2 diabetes mellitus with hyperglycemia (HCC)  Acute kidney injury superimposed on chronic kidney disease (HCC)  Hypothermia  Hypokalemia  Hyponatremia  Essential hypertension  Mixed hyperlipidemia   I have reviewed the medical record, interviewed the patient and family, and examined the patient. The following aspects are pertinent.  Past Medical History:  Diagnosis Date   Allergy    Anxiety    ANXIETY DISORDER, GENERALIZED 07/15/2007   Qualifier: Diagnosis of  By: Michaell Adolph     Arthritis    Cancer [facility]) 2009   breast, carcinoma in situ left   Carcinoma in situ of breast 05/21/2008   Qualifier: Diagnosis of  By: Rodolph Clap MD, Margaret  Diagnosed in 2009, completed  5 year course of tamoxifen, no evidence of recurrence    Carotid stenosis    11/16/2005  mild plaque formation and stenosis proximal right ECA   Cataract    Complication of anesthesia    Coronary artery disease    cardiac catheterization on 03/20/2006  LAD mid 40% stenosis, left circumflex mild 40% stenosis, RCA mid-vessel 40% to 50% lesion   EF 60%   Diabetes mellitus    GERD (gastroesophageal reflux disease)    Hernia, inguinal    left   Hyperglycemia    Hypertension    Insomnia 11/16/2011   Low blood potassium    Non-insulin  dependent type 2 diabetes mellitus (HCC)    Osteoporosis    Shortness of breath    2D Echocardiogram 01/26/2009   EF of greater than 55%, mild MR, mild TR, normal ventricular function   Thickened  endometrium 10/26/2017   Noted by gyne in 2017, missed 6 month follow up, referred in 09/2017   Ventricular tachycardia, non-sustained (HCC)    developed during stress test 02/08/2006, spontaneously aborted, mild reversible apical defect   Social History   Socioeconomic History   Marital status: Married    Spouse name: Not on file   Number of children: 2   Years of education: na   Highest education level: Some college, no degree  Occupational History   Occupation: retired    Associate Professor: retired  Tobacco Use   Smoking status: Never    Passive exposure: Never   Smokeless tobacco: Never  Vaping Use   Vaping status: Never Used  Substance and Sexual Activity   Alcohol use: No   Drug use: No   Sexual activity: Not Currently    Birth control/protection: Post-menopausal  Other Topics Concern   Not on file  Social History Narrative   Lives with her husband and son and attends yoga at the [facility] 2 days a week and speaks to her grandson nightly on the phone   Social Drivers of Health   Financial Resource Strain: Low Risk  (12/28/2022)   Overall Financial Resource Strain (CARDIA)    Difficulty of Paying Living Expenses: Not hard at all  Food Insecurity: No Food Insecurity (01/22/2024)   Hunger Vital Sign    Worried About Running Out of Food in the Last Year: Never true    Ran Out of Food in the Last Year: Never true  Transportation Needs: No Transportation Needs (01/22/2024)   PRAPARE - Administrator, Civil Service (Medical): No    Lack of Transportation (Non-Medical): No  Physical Activity: Sufficiently Active (11/15/2022)   Exercise Vital Sign    Days of Exercise per Week: 5 days    Minutes of Exercise per Session: 30 min  Stress: No Stress Concern Present (12/28/2022)   [employer] of Occupational Health - Occupational Stress Questionnaire    Feeling of Stress : Only a little  Social Connections: Socially Integrated (01/22/2024)   Social Connection and Isolation Panel  [NHANES]    Frequency of Communication with Friends and Family: Three times a week    Frequency of Social Gatherings with Friends and Family: Three times a week    Attends Religious Services: More than 4 times per year    Active Member of Clubs or Organizations: Yes    Attends Banker Meetings: 1 to 4 times per year    Marital Status: Married   Family History  Problem Relation Age of Onset   Hypertension Mother  Hyperlipidemia Mother    Stroke Mother    Urticaria Mother    Cancer Father        pancreatic   Colon cancer Father    Heart disease Brother 40       bypass   Heart disease Brother 38       bypass   Arthritis Other    Asthma Other    Diabetes Other    Colon cancer Paternal Aunt    Esophageal cancer Neg Hx    Stomach cancer Neg Hx    Rectal cancer Neg Hx    Scheduled Meds:  amLODipine   5 mg Oral Daily   cloNIDine   0.3 mg Oral TID   heparin   5,000 Units Subcutaneous Q8H   hydrALAZINE   100 mg Oral TID   insulin  aspart  0-6 Units Subcutaneous TID WC   insulin  aspart protamine- aspart  15 Units Subcutaneous Q breakfast   isosorbide  mononitrate  60 mg Oral Daily   labetalol   300 mg Oral BID   magnesium  oxide  400 mg Oral Daily   polyethylene glycol  17 g Oral Daily   rosuvastatin   5 mg Oral Daily   sennosides  15 mL Oral BID   spironolactone   25 mg Oral Daily   torsemide  40 mg Oral BID   Continuous Infusions: PRN Meds:.acetaminophen  **OR** acetaminophen , ALPRAZolam , hydrALAZINE , ondansetron  **OR** ondansetron  (ZOFRAN ) IV Medications Prior to Admission:  Prior to Admission medications   Medication Sig Start Date End Date Taking? Authorizing Provider  albuterol  (VENTOLIN  HFA) 108 (90 Base) MCG/ACT inhaler Inhale 2 puffs into the lungs every 2 (two) hours as needed for wheezing or shortness of breath. 07/16/23  Yes Del Orbe Polanco, Rogerio Clay, FNP  carvedilol  (COREG ) 12.5 MG tablet Take 12.5 mg by mouth 2 (two) times daily with a meal.   Yes [provider]  furosemide  (LASIX ) 40 MG tablet Take 1 tablet (40 mg total) by mouth daily. Patient taking differently: Take 40 mg by mouth 2 (two) times daily. 07/25/23 07/24/24 Yes Justina Oman, MD  hydrALAZINE  (APRESOLINE ) 100 MG tablet Take 1 tablet (100 mg total) by mouth 3 (three) times daily. 10/16/23  Yes Towanda Fret, MD  Insulin  Lispro Prot & Lispro (HUMALOG  MIX 75/25 KWIKPEN) (75-25) 100 UNIT/ML Kwikpen Inject 10 Units into the skin 2 (two) times daily before a meal. 12/05/23  Yes [name], Gebreselassie W, MD  NIFEdipine  (ADALAT  CC) 60 MG 24 hr tablet Take 1 tablet (60 mg total) by mouth daily. 01/15/24  Yes BranchJoyceann No, MD  potassium chloride  SA (KLOR-CON  M) 20 MEQ tablet Take 20 mEq by mouth daily.   Yes [provider]  RESTASIS  0.05 % ophthalmic emulsion Place 1 drop into both eyes 2 (two) times daily. 11/01/23  Yes [provider]  rosuvastatin  (CRESTOR ) 5 MG tablet TAKE 1 TABLET (5 MG TOTAL) BY MOUTH DAILY. 11/06/23  Yes Towanda Fret, MD  Continuous Glucose Sensor (DEXCOM G7 SENSOR) MISC Patient is to change the sensor every 10 days to monitor her blood sugars as directed by the provider. 12/06/23   [name], Gebreselassie W, MD   Allergies  Allergen Reactions   Elvina Hammers [Dapagliflozin] Other (See Comments)    DKA   Metformin  And Related Diarrhea    Lost appetite and weight    Benadryl [Diphenhydramine Hcl] Hypertension   Citalopram Other (See Comments)    Unknown    Tramadol  Other (See Comments)    Felt light headed and dizzy   Amlodipine   Swelling   Crestor  [Rosuvastatin ] Swelling and Other (See Comments)    "Feet swelling" Makes her feel weak. Pt takes 5mg  at home   Review of Systems  Unable to perform ROS: Age    Physical Exam Vitals and nursing note reviewed.  Constitutional:      General: She is not in acute distress.    Appearance: She is ill-appearing.  Cardiovascular:     Rate and Rhythm: Normal rate.  Pulmonary:     Effort:  Pulmonary effort is normal. No respiratory distress.  Skin:    General: Skin is warm and dry.  Neurological:     Mental Status: She is alert and oriented to person, place, and time.  Psychiatric:        Mood and Affect: Mood normal.        Behavior: Behavior normal.     Vital Signs: BP (!) 186/73 (BP Location: Left Arm)   Pulse 86   Temp 98.7 F (37.1 C) (Oral)   Resp 18   Ht 5' (1.524 m)   Wt 57.5 kg   SpO2 97%   BMI 24.76 kg/m  Pain Scale: 0-10 POSS *See Group Information*: 1-Acceptable,Awake and alert Pain Score: 0-No pain   SpO2: SpO2: 97 % O2 Device:SpO2: 97 % O2 Flow Rate: .   IO: Intake/output summary:  Intake/Output Summary (Last 24 hours) at 01/29/2024 0908 Last data filed at 01/28/2024 2200 Gross per 24 hour  Intake 360 ml  Output --  Net 360 ml    LBM: Last BM Date : 01/29/24 Baseline Weight: Weight: 50 kg Most recent weight: Weight: 57.5 kg     Palliative Assessment/Data:     Time In: 1040   Time Out: 1120 Time Total: 40 minutes  Greater than 50%  of this time was spent counseling and coordinating care related to the above assessment and plan.  Signed by: Annabelle Barrack, NP   Please contact Palliative Medicine Team phone at [phone] for questions and concerns.  For individual provider: See Tilford Foley

## 2024-01-29 NOTE — Progress Notes (Signed)
 PROGRESS NOTE    Patient: Emily Jennings                            PCP: Towanda Fret, MD                    DOB: [birth year]            DOA: 01/22/2024 ZOX:[id number]             DOS: 01/29/2024, 12:49 PM   LOS: 7 days   Date of Service: The patient was seen and examined on 01/29/2024  Subjective:   The patient was seen and examined this morning, laying in bed comfortably. Still hypertensive, difficult to obtain optimal BP with multiple medications Labile diabetes, blood sugars fluctuating-elevated this morning   Brief Narrative:    Emily Jennings is an 89 y.o. female with medical history significant of hypertension, T2DM, CHF, CAD, CKD stage IV who presents to the emergency department due to elevated blood sugar within last 2 to 3 days. She states that she has been compliant with her insulin  as prescribed.  Patient was admitted with hyperglycemia in the setting of type 2 diabetes as well as AKI on CKD stage V.  She was also noted to be hypothermic and hyponatremic ED Course:  BP was 152/117, temperature 94.58F.  CBC showed normocytic anemia, BMP showed sodium of 123, potassium 3.0, chloride 84, bicarb 26, blood glucose 356, BUN 78, creatinine 3.70, GFR 11.  Urinalysis was positive for glycosuria, magnesium  2.3 Patient was treated with 10 units of NovoLog , IV hydration was provided.     Assessment & Plan:   Principal Problem:   Type 2 diabetes mellitus with hyperglycemia (HCC) Active Problems:   Acute kidney injury superimposed on chronic kidney disease (HCC)   Hyponatremia   Hypothermia   Hypokalemia   Mixed hyperlipidemia   Essential hypertension   Type 2 diabetes mellitus with hyperglycemia - Brittle diabetes, with extreme episode of hyper/hypoglycemia-this is exacerbated by progressive renal failure  - Holding-long-acting insulin  Semglee   - Currently on NovoLog  70/30, changed from twice daily to every morning increased to 20 units this a.m. 01/29/2024  - Continuing  SSI coverage    Acute kidney injury superimposed on CKD stage V Progressive renal failure-Per nephrology exacerbated by uncontrolled BP, volume overload -Baseline creatinine 2.3-2.9 - Changed IV Lasix  to torsemide 40 mg twice daily today by nephrology -Nephrology consulted-monitoring close follow-up and recommendations - S/p placement of albumin  -Monitoring I's and O's  Intake/Output Summary (Last 24 hours) at 01/29/2024 1246 Last data filed at 01/28/2024 2200 Gross per 24 hour  Intake 360 ml  Output --  Net 360 ml    POA: BUN/creatinine 70/3.70 (baseline creatinine 2.3-2.4) - Holding nephrotoxic, Lab Results  Component Value Date   CREATININE 3.60 (H) 01/29/2024   CREATININE 3.17 (H) 01/28/2024   CREATININE 3.23 (H) 01/27/2024    Accelerated hypertension w h/o hypertension - Elevated BP -improving  - Switch Coreg  to -- Labetalol  300 mg p.o. twice daily,  - Clonidine  increased to 0.3 mg p.o. 3 times daily,  - Hydralazine  100 mg p.o. 3 times daily,  - Imdur  60 mg p.o. daily - Norvasc  10 mg p.o. daily - Aldactone  25 mg daily   Hypothermia -resolved-  TSH normal at 2.6, improved glycemic control   Hypokalemia -monitoring, once again as low as 2.9, repleted with 40 KCl twice daily x 2 doses-checking and replacing magnesium   Hyponatremia, hypovolemia -Improving Na 123, corrected sodium level based on hyperglycemia (350) is 127 Serum osmolality 290, -Serum sodium 123->>>> 130, >> 131     Mixed hyperlipidemia Continue Crestor    Diastolic CHF (congestive heart failure)  -preserved ejection fraction 40 grade 1 diastolic dysfunction Continue total input/output, daily weights  H/o Diastolic CHF.  Stable and compensated.   Last echo 04/2023 EF of 65 to 70% grade 2 DD.  Ethics: Discussed with patient and her husband, remain full code Prognosis remain poor due to multiple qualities, progressive renal  failure    ----------------------------------------------------------------------------------------------------------------------------------------------- Nutritional status:  The patient's BMI is: Body mass index is 24.76 kg/m. I agree with the assessment and plan as outlined -----------------------------------------------------------------------------------------------------------------  DVT prophylaxis:  heparin  injection 5,000 Units Start: 01/22/24 0845 SCDs Start: 01/22/24 0746   Code Status:   Code Status: Full Code  Family Communication: Husband present at bedside-updated  -Advance care planning has been discussed.   Admission status:   Status is: Inpatient Remains inpatient appropriate because: Needing IV fluids, electrolyte replacement   Disposition: From  - home             Planning for discharge in 1 days: to home with home health  Procedures:   No admission procedures for hospital encounter.   Antimicrobials:  Anti-infectives (From admission, onward)    None        Medication:   amLODipine   10 mg Oral Daily   cloNIDine   0.3 mg Oral TID   heparin   5,000 Units Subcutaneous Q8H   hydrALAZINE   100 mg Oral TID   insulin  aspart  0-6 Units Subcutaneous TID WC   [START ON 01/30/2024] insulin  aspart protamine- aspart  20 Units Subcutaneous Q breakfast   isosorbide  mononitrate  60 mg Oral Daily   labetalol   300 mg Oral BID   magnesium  oxide  400 mg Oral Daily   polyethylene glycol  17 g Oral Daily   rosuvastatin   5 mg Oral Daily   sennosides  15 mL Oral BID   spironolactone   25 mg Oral Daily   [START ON 01/30/2024] torsemide  40 mg Oral BID    acetaminophen  **OR** acetaminophen , ALPRAZolam , hydrALAZINE , ondansetron  **OR** ondansetron  (ZOFRAN ) IV   Objective:   Vitals:   01/29/24 0608 01/29/24 0621 01/29/24 1135 01/29/24 1236  BP: (!) 172/74 (!) 186/73 (!) 178/66 (!) 169/60  Pulse: 85 86 77 68  Resp:  18    Temp:  98.7 F (37.1 C)  98.3 F (36.8  C)  TempSrc:  Oral  Oral  SpO2:  97% 96% 99%  Weight:      Height:        Intake/Output Summary (Last 24 hours) at 01/29/2024 1249 Last data filed at 01/28/2024 2200 Gross per 24 hour  Intake 360 ml  Output --  Net 360 ml   Filed Weights   01/27/24 0443 01/28/24 0500 01/29/24 0405  Weight: 58.1 kg 57.7 kg 57.5 kg     Physical examination:    General:  AAO x 3,  cooperative, no distress;   HEENT:  Normocephalic, PERRL, otherwise with in Normal limits   Neuro:  CNII-XII intact. , normal motor and sensation, reflexes intact   Lungs:   Clear to auscultation BL, Respirations unlabored,  No wheezes / crackles  Cardio:    S1/S2, RRR, No murmure, No Rubs or Gallops   Abdomen:  Soft, non-tender, bowel sounds active all four quadrants, no guarding or peritoneal signs.  Muscular  skeletal:  Limited  exam -global generalized weaknesses - in bed, able to move all 4 extremities,   2+ pulses,  symmetric, No pitting edema  Skin:  Dry, warm to touch, negative for any Rashes,  Wounds: Please see nursing documentation      LABs:     Latest Ref Rng & Units 01/23/2024    3:26 AM 01/22/2024    1:22 AM 07/27/2023    9:15 AM  CBC  WBC 4.0 - 10.5 K/uL 5.9  5.1  7.4   Hemoglobin 12.0 - 15.0 g/dL 16.1  9.4  9.2   Hematocrit 36.0 - 46.0 % 29.0  26.1  28.2   Platelets 150 - 400 K/uL 245  192  256       Latest Ref Rng & Units 01/29/2024    4:21 AM 01/28/2024    3:48 AM 01/27/2024   10:06 PM  CMP  Glucose 70 - 99 mg/dL 096  045  98   BUN 8 - 23 mg/dL 82  72    Creatinine 4.09 - 1.00 mg/dL 8.11  9.14    Sodium 782 - 145 mmol/L 131  132    Potassium 3.5 - 5.1 mmol/L 4.6  2.9    Chloride 98 - 111 mmol/L 93  92    CO2 22 - 32 mmol/L 26  29    Calcium  8.9 - 10.3 mg/dL 9.3  9.1         Micro Results Recent Results (from the past 240 hours)  MRSA Next Gen by PCR, Nasal     Status: None   Collection Time: 01/22/24  6:40 AM   Specimen: Nasal Mucosa; Nasal Swab  Result Value Ref Range Status    MRSA by PCR Next Gen NOT DETECTED NOT DETECTED Final    Comment: (NOTE) The GeneXpert MRSA Assay (FDA approved for NASAL specimens only), is one component of a comprehensive MRSA colonization surveillance program. It is not intended to diagnose MRSA infection nor to guide or monitor treatment for MRSA infections. Test performance is not FDA approved in patients less than 48 years old. Performed at [facility], [address] [place], [place] [address]   Resp panel by RT-PCR (RSV, Flu A&B, Covid) Anterior Nasal Swab     Status: None   Collection Time: 01/22/24 12:53 PM   Specimen: Anterior Nasal Swab  Result Value Ref Range Status   SARS Coronavirus 2 by RT PCR NEGATIVE NEGATIVE Final    Comment: (NOTE) SARS-CoV-2 target nucleic acids are NOT DETECTED.  The SARS-CoV-2 RNA is generally detectable in upper respiratory specimens during the acute phase of infection. The lowest concentration of SARS-CoV-2 viral copies this assay can detect is 138 copies/mL. A negative result does not preclude SARS-Cov-2 infection and should not be used as the sole basis for treatment or other patient management decisions. A negative result may occur with  improper specimen collection/handling, submission of specimen other than nasopharyngeal swab, presence of viral mutation(s) within the areas targeted by this assay, and inadequate number of viral copies(<138 copies/mL). A negative result must be combined with clinical observations, patient history, and epidemiological information. The expected result is Negative.  Fact Sheet for Patients:  BloggerCourse.com  Fact Sheet for Healthcare Providers:  SeriousBroker.it  This test is no t yet approved or cleared by the [place]  FDA and  has been authorized for detection and/or diagnosis of SARS-CoV-2 by FDA under an Emergency Use Authorization (EUA). This EUA will remain  in effect (meaning this test  can be  used) for the duration of the COVID-19 declaration under Section 564(b)(1) of the Act, 21 U.S.C.section 360bbb-3(b)(1), unless the authorization is terminated  or revoked sooner.       Influenza A by PCR NEGATIVE NEGATIVE Final   Influenza B by PCR NEGATIVE NEGATIVE Final    Comment: (NOTE) The Xpert Xpress SARS-CoV-2/FLU/RSV plus assay is intended as an aid in the diagnosis of influenza from Nasopharyngeal swab specimens and should not be used as a sole basis for treatment. Nasal washings and aspirates are unacceptable for Xpert Xpress SARS-CoV-2/FLU/RSV testing.  Fact Sheet for Patients: BloggerCourse.com  Fact Sheet for Healthcare Providers: SeriousBroker.it  This test is not yet approved or cleared by the [place]  FDA and has been authorized for detection and/or diagnosis of SARS-CoV-2 by FDA under an Emergency Use Authorization (EUA). This EUA will remain in effect (meaning this test can be used) for the duration of the COVID-19 declaration under Section 564(b)(1) of the Act, 21 U.S.C. section 360bbb-3(b)(1), unless the authorization is terminated or revoked.     Resp Syncytial Virus by PCR NEGATIVE NEGATIVE Final    Comment: (NOTE) Fact Sheet for Patients: BloggerCourse.com  Fact Sheet for Healthcare Providers: SeriousBroker.it  This test is not yet approved or cleared by the [place]  FDA and has been authorized for detection and/or diagnosis of SARS-CoV-2 by FDA under an Emergency Use Authorization (EUA). This EUA will remain in effect (meaning this test can be used) for the duration of the COVID-19 declaration under Section 564(b)(1) of the Act, 21 U.S.C. section 360bbb-3(b)(1), unless the authorization is terminated or revoked.  Performed at [facility], [address] [place], [place] [address]     Radiology Reports No results  found.    SIGNED: Bobbetta Burnet, MD, FHM. FAAFP. Arlin Benes - [facility] hospitalist Time spent - 55 min.  In seeing, evaluating and examining the patient. Reviewing medical records, labs, drawn plan of care. [facility],  Pager (please use [email or web address] to page/ text) Please use Epic Secure Chat for non-urgent communication (7AM-7PM)  If 7PM-7AM, please contact night-coverage [email or web address], 01/29/2024, 12:49 PM

## 2024-01-29 NOTE — Progress Notes (Signed)
 Emily Jennings Progress Note    Assessment/ Plan:   # Acute kidney injury on CKD stage IV with chronic proteinuria: Baseline creatinine around 2.3-2.9.  Rising creatinine likely associated with some optimization of blood pressure and volume status.  Some degree of cardiorenal syndrome as well as progression of her CKD.  Followed by Dr. Carrolyn Clan as outpatient. No obstruction on u/s.   She remains volume overloaded with extensive hypertension.  We will see her creatinine go up as we optimize her blood pressure but this is necessary and she is symptomatic from the hypertension - Switch oral Lasix  to torsemide 40 mg twice daily -Watch labs as well as ins and outs Daily -Her htn regimen is mainly with the use of medications that attempt to avoid a Creatinine rise. To better optimize her BP we will have to use appropriate meds and sacrifice some degree of GFR -Further hypertension management as below -no indication for renal replacement therapy    # Hypertensive urgency: BP remains poorly controlled -currently on hydral 100mg  tid, imdur  60mg  daily, labetalol  300mg  twice daily, furosemide  40mg  daily -some hypokalemia -her nifedipine  is on hold -start amlodipine  5mg  daily; allergy is swelling. Nifedipine  and hydral both also cause swelling as well. Not an absolute contraindication -Start spiro 25mg  daily which will help with hypokalemia and BP -switch furosemide  to torsemide 40mg  BID   # Type II DM with hyperglycemia and hypoglycemia: Adjustment of insulin  by primary team.   # Acute on chronic diastolic CHF with fluid overload: Echo with preserved EF however has grade 2 diastolic dysfunction.  Diuretics as above   # False hyponatremia: corrects to normal when accounting for glucose  #Hypokalemia: f/u labs from today. Start spiro as above  Subjective:   Patient states she is feeling short of breath but denies chest pain.  No nausea, vomiting, diarrhea.   Objective:   BP (!)  186/73 (BP Location: Left Arm)   Pulse 86   Temp 98.7 F (37.1 C) (Oral)   Resp 18   Ht 5' (1.524 m)   Wt 57.5 kg   SpO2 97%   BMI 24.76 kg/m   Intake/Output Summary (Last 24 hours) at 01/29/2024 0855 Last data filed at 01/28/2024 2200 Gross per 24 hour  Intake 360 ml  Output --  Net 360 ml   Weight change: -0.2 kg  Physical Exam: Gen: NAD, sitting up in bed CVS: Normal rate, no rub Resp: Bilateral chest rise, mild increased work of breathing Abd: soft Ext: 1+ pitting edema b/l Les, warm and well-perfused Neuro: awake, alert, hard of hearing  Imaging: No results found.  Labs: BMET Recent Labs  Lab 01/23/24 0326 01/24/24 0847 01/25/24 0513 01/26/24 0416 01/27/24 0413 01/27/24 2206 01/28/24 0348  NA 130* 130* 134* 130* 131*  --  132*  K 3.6 3.1* 4.0 3.9 3.5  --  2.9*  CL 94* 94* 100 94* 93*  --  92*  CO2 26 28 29 27 27   --  29  GLUCOSE 227* 98 24* 282* 196* 98 115*  BUN 63* 58* 64* 66* 72*  --  72*  CREATININE 3.29* 3.09* 2.93* 2.73* 3.23*  --  3.17*  CALCIUM  8.4* 8.4* 8.3* 8.6* 8.9  --  9.1  PHOS 3.5  --   --   --   --   --   --    CBC Recent Labs  Lab 01/23/24 0326  WBC 5.9  HGB 10.0*  HCT 29.0*  MCV 92.1  PLT 245  Medications:     amLODipine   5 mg Oral Daily   cloNIDine   0.3 mg Oral TID   heparin   5,000 Units Subcutaneous Q8H   hydrALAZINE   100 mg Oral TID   insulin  aspart  0-6 Units Subcutaneous TID WC   insulin  aspart protamine- aspart  15 Units Subcutaneous Q breakfast   isosorbide  mononitrate  60 mg Oral Daily   labetalol   300 mg Oral BID   magnesium  oxide  400 mg Oral Daily   polyethylene glycol  17 g Oral Daily   rosuvastatin   5 mg Oral Daily   sennosides  15 mL Oral BID   spironolactone   25 mg Oral Daily   torsemide  40 mg Oral BID     [facility] 01/29/2024, 8:55 AM

## 2024-01-29 NOTE — Plan of Care (Signed)
   Problem: Education: Goal: Knowledge of General Education information will improve Description: Including pain rating scale, medication(s)/side effects and non-pharmacologic comfort measures Outcome: Progressing   Problem: Health Behavior/Discharge Planning: Goal: Ability to manage health-related needs will improve Outcome: Progressing   Problem: Clinical Measurements: Goal: Ability to maintain clinical measurements within normal limits will improve Outcome: Progressing Goal: Will remain free from infection Outcome: Progressing Goal: Diagnostic test results will improve Outcome: Progressing Goal: Respiratory complications will improve Outcome: Progressing Goal: Cardiovascular complication will be avoided Outcome: Progressing   Problem: Activity: Goal: Risk for activity intolerance will decrease Outcome: Progressing   Problem: Nutrition: Goal: Adequate nutrition will be maintained Outcome: Progressing   Problem: Coping: Goal: Level of anxiety will decrease Outcome: Progressing   Problem: Elimination: Goal: Will not experience complications related to bowel motility Outcome: Progressing Goal: Will not experience complications related to urinary retention Outcome: Progressing   Problem: Pain Managment: Goal: General experience of comfort will improve and/or be controlled Outcome: Progressing   Problem: Safety: Goal: Ability to remain free from injury will improve Outcome: Progressing   Problem: Skin Integrity: Goal: Risk for impaired skin integrity will decrease Outcome: Progressing   Problem: Education: Goal: Ability to describe self-care measures that may prevent or decrease complications (Diabetes Survival Skills Education) will improve Outcome: Progressing Goal: Individualized Educational Video(s) Outcome: Progressing   Problem: Coping: Goal: Ability to adjust to condition or change in health will improve Outcome: Progressing   Problem: Fluid  Volume: Goal: Ability to maintain a balanced intake and output will improve Outcome: Progressing   Problem: Health Behavior/Discharge Planning: Goal: Ability to identify and utilize available resources and services will improve Outcome: Progressing Goal: Ability to manage health-related needs will improve Outcome: Progressing   Problem: Metabolic: Goal: Ability to maintain appropriate glucose levels will improve Outcome: Progressing   Problem: Nutritional: Goal: Maintenance of adequate nutrition will improve Outcome: Progressing Goal: Progress toward achieving an optimal weight will improve Outcome: Progressing   Problem: Skin Integrity: Goal: Risk for impaired skin integrity will decrease Outcome: Progressing   Problem: Tissue Perfusion: Goal: Adequacy of tissue perfusion will improve Outcome: Progressing   Problem: Education: Goal: Ability to describe self-care measures that may prevent or decrease complications (Diabetes Survival Skills Education) will improve Outcome: Progressing Goal: Individualized Educational Video(s) Outcome: Progressing   Problem: Coping: Goal: Ability to adjust to condition or change in health will improve Outcome: Progressing   Problem: Fluid Volume: Goal: Ability to maintain a balanced intake and output will improve Outcome: Progressing   Problem: Health Behavior/Discharge Planning: Goal: Ability to identify and utilize available resources and services will improve Outcome: Progressing Goal: Ability to manage health-related needs will improve Outcome: Progressing   Problem: Metabolic: Goal: Ability to maintain appropriate glucose levels will improve Outcome: Progressing   Problem: Nutritional: Goal: Maintenance of adequate nutrition will improve Outcome: Progressing Goal: Progress toward achieving an optimal weight will improve Outcome: Progressing   Problem: Skin Integrity: Goal: Risk for impaired skin integrity will  decrease Outcome: Progressing   Problem: Tissue Perfusion: Goal: Adequacy of tissue perfusion will improve Outcome: Progressing

## 2024-01-29 NOTE — Progress Notes (Signed)
 Physical Therapy Treatment Patient Details Name: Emily Jennings MRN: [id number] DOB: 08/25/1934 Today's Date: 01/29/2024   History of Present Illness Emily Jennings is an 89 y.o. female with medical history significant of hypertension, T2DM, CHF, CAD, CKD stage IV who presents to the emergency department due to elevated blood sugar within last 2 to 3 days. She states that she has been compliant with her insulin  as prescribed.  Patient denies chest pain, shortness of breath.  She endorsed right hand swelling.    PT Comments  Patient demonstrates slight decline since previous session. Pt is stuporous upon presentation with tactile cues required to awake pt. Pt continues to demonstrate decreased LE strength, decreased gait quality and balance. Patient also demonstrates decreased endurance with bed mobility, functional transfers, and therapeutic exercise during today's session. Patient demonstrates decreased clarity of voice and decreased ability to follow one step commands consistently. Patient would continue to benefit from skilled physical therapy for increased endurance with ambulation, increased LE strength, and improved balance for improved quality of life, improved independence with gait training and continued progress towards therapy goals.     If plan is discharge home, recommend the following: A lot of help with walking and/or transfers;A lot of help with bathing/dressing/bathroom;Assistance with cooking/housework;Assist for transportation;Help with stairs or ramp for entrance   Can travel by private vehicle        Equipment Recommendations  None recommended by PT    Recommendations for Other Services       Precautions / Restrictions Precautions Precautions: Fall Restrictions Weight Bearing Restrictions Per Provider Order: No     Mobility  Bed Mobility Overal bed mobility: Needs Assistance Bed Mobility: Supine to Sit     Supine to sit: Min assist, HOB elevated     General bed  mobility comments: incresed time, labored movement    Transfers Overall transfer level: Needs assistance Equipment used: Rolling walker (2 wheels), None Transfers: Sit to/from Stand, Bed to chair/wheelchair/BSC Sit to Stand: Min assist, Mod assist   Step pivot transfers: Min assist, Mod assist       General transfer comment: RW needed, posterior lean, flexed trunk    Ambulation/Gait Ambulation/Gait assistance: Mod assist Gait Distance (Feet): 3 Feet Assistive device: Rolling walker (2 wheels) Gait Pattern/deviations: Decreased step length - right, Decreased step length - left, Decreased stride length Gait velocity: decreased     General Gait Details: slow labored cadence without loss of balance using RW and limited mostly due to fatigue   Stairs             Wheelchair Mobility     Tilt Bed    Modified Rankin (Stroke Patients Only)       Balance Overall balance assessment: Needs assistance Sitting-balance support: Feet supported, No upper extremity supported Sitting balance-Leahy Scale: Fair Sitting balance - Comments: fair/good seated at EOB Postural control: Posterior lean Standing balance support: During functional activity, Single extremity supported Standing balance-Leahy Scale: Poor Standing balance comment: fair/good using RW                            Communication Communication Communication: Impaired Factors Affecting Communication: Reduced clarity of speech  Cognition Arousal: Stuporous (pt requires tactile cues to open eyes, has trouble staying awake until after functional transfer) Behavior During Therapy: [facility] for tasks assessed/performed   PT - Cognitive impairments: No apparent impairments, Difficult to assess  Following commands: Impaired Following commands impaired: Follows one step commands inconsistently    Cueing Cueing Techniques: Verbal cues, Tactile cues  Exercises General  Exercises - Lower Extremity Ankle Circles/Pumps: AROM, 10 reps Long Arc Quad: AROM, 10 reps Hip Flexion/Marching: AROM, 10 reps    General Comments        Pertinent Vitals/Pain Pain Assessment Pain Assessment: No/denies pain    Home Living                          Prior Function            PT Goals (current goals can now be found in the care plan section) Acute Rehab PT Goals Patient Stated Goal: to get stronger PT Goal Formulation: With patient Time For Goal Achievement: 01/28/24 Potential to Achieve Goals: Good Progress towards PT goals: Progressing toward goals    Frequency    Min 3X/week      PT Plan      Co-evaluation              AM-PAC PT "6 Clicks" Mobility   Outcome Measure  Help needed turning from your back to your side while in a flat bed without using bedrails?: A Little Help needed moving from lying on your back to sitting on the side of a flat bed without using bedrails?: A Lot Help needed moving to and from a bed to a chair (including a wheelchair)?: A Lot Help needed standing up from a chair using your arms (e.g., wheelchair or bedside chair)?: A Little Help needed to walk in hospital room?: A Lot Help needed climbing 3-5 steps with a railing? : A Lot 6 Click Score: 14    End of Session   Activity Tolerance: Patient tolerated treatment well;Patient limited by fatigue Patient left: in chair;with call bell/phone within reach Nurse Communication: Mobility status PT Visit Diagnosis: Unsteadiness on feet (R26.81);Other abnormalities of gait and mobility (R26.89);Muscle weakness (generalized) (M62.81)     Time: 1016-1050 PT Time Calculation (min) (ACUTE ONLY): 34 min  Charges:    $Therapeutic Exercise: 23-37 mins PT General Charges $$ ACUTE PT VISIT: 1 Visit                     Armond Bertin, PT, DPT [facility] Office: [phone] 11:55 AM, 01/29/24

## 2024-01-29 NOTE — TOC Progression Note (Signed)
 Transition of Care [facility]) - Progression Note    Patient Details  Name: Emily Jennings MRN: [id number] Date of Birth: 10/09/1933  [facility] CM/SW Contact  [place] Lax, [place] Phone Number: 01/29/2024, 1:33 PM  Clinical Narrative:    CSW spoke with pt and spouse at bedside to review SNF recommendation. Pts spouse states that they manage well and plan for return home once medically stable. Pt is not interested in SNF placement at this time. TOC to follow.   Expected Discharge Plan: Home w Home Health Services Barriers to Discharge: Continued Medical Work up  Expected Discharge Plan and Services In-house Referral: Clinical Social Work   Post Acute Care Choice: Home Health Living arrangements for the past 2 months: Single Family Home                           HH Arranged: OT, PT HH Agency: [facility] Date [facility] Agency Contacted: 01/24/24   Representative spoke with at [facility] Agency: Randel Buss   Social Determinants of Health (SDOH) Interventions SDOH Screenings   Food Insecurity: No Food Insecurity (01/22/2024)  Housing: Low Risk  (01/22/2024)  Transportation Needs: No Transportation Needs (01/22/2024)  Utilities: Not At Risk (01/22/2024)  Alcohol Screen: Low Risk  (11/15/2022)  Depression (PHQ2-9): Low Risk  (10/24/2023)  Financial Resource Strain: Low Risk  (12/28/2022)  Physical Activity: Sufficiently Active (11/15/2022)  Social Connections: Socially Integrated (01/22/2024)  Stress: No Stress Concern Present (12/28/2022)  Tobacco Use: Low Risk  (01/29/2024)    Readmission Risk Interventions    01/23/2024    8:19 AM 07/19/2023    2:27 PM 07/04/2023    8:27 AM  Readmission Risk Prevention Plan  Transportation Screening Complete Complete Complete  HRI or Home Care Consult Complete    Social Work Consult for Recovery Care Planning/Counseling Complete    Palliative Care Screening Not Applicable    Medication Review Oceanographer) Complete Complete Complete  PCP  or Specialist appointment within 3-5 days of discharge  Not Complete   HRI or Home Care Consult  Complete Complete  SW Recovery Care/Counseling Consult  Complete Complete  Palliative Care Screening  Not Applicable Not Applicable  Skilled Nursing Facility  Not Applicable Not Applicable

## 2024-01-30 DIAGNOSIS — N179 Acute kidney failure, unspecified: Secondary | ICD-10-CM | POA: Diagnosis not present

## 2024-01-30 DIAGNOSIS — N189 Chronic kidney disease, unspecified: Secondary | ICD-10-CM

## 2024-01-30 DIAGNOSIS — I10 Essential (primary) hypertension: Secondary | ICD-10-CM | POA: Diagnosis not present

## 2024-01-30 DIAGNOSIS — E1165 Type 2 diabetes mellitus with hyperglycemia: Secondary | ICD-10-CM | POA: Diagnosis not present

## 2024-01-30 DIAGNOSIS — E871 Hypo-osmolality and hyponatremia: Secondary | ICD-10-CM | POA: Diagnosis not present

## 2024-01-30 LAB — GLUCOSE, CAPILLARY
Glucose-Capillary: 218 mg/dL — ABNORMAL HIGH (ref 70–99)
Glucose-Capillary: 324 mg/dL — ABNORMAL HIGH (ref 70–99)
Glucose-Capillary: 286 mg/dL — ABNORMAL HIGH (ref 70–99)
Glucose-Capillary: 94 mg/dL (ref 70–99)
Glucose-Capillary: 68 mg/dL — ABNORMAL LOW (ref 70–99)
Glucose-Capillary: 84 mg/dL (ref 70–99)

## 2024-01-30 LAB — BASIC METABOLIC PANEL WITH GFR
Anion gap: 13 (ref 5–15)
BUN: 88 mg/dL — ABNORMAL HIGH (ref 8–23)
CO2: 26 mmol/L (ref 22–32)
Calcium: 9 mg/dL (ref 8.9–10.3)
Chloride: 92 mmol/L — ABNORMAL LOW (ref 98–111)
Creatinine, Ser: 3.58 mg/dL — ABNORMAL HIGH (ref 0.44–1.00)
GFR, Estimated: 12 mL/min — ABNORMAL LOW (ref >60)
Glucose, Bld: 219 mg/dL — ABNORMAL HIGH (ref 70–99)
Potassium: 4.4 mmol/L (ref 3.5–5.1)
Sodium: 131 mmol/L — ABNORMAL LOW (ref 135–145)

## 2024-01-30 MED ORDER — INSULIN ASPART PROT & ASPART (70-30 MIX) 100 UNIT/ML ~~LOC~~ SUSP
10.0000 [IU] | Freq: Two times a day (BID) | SUBCUTANEOUS | Status: DC
  Filled 2024-01-30: qty 10

## 2024-01-30 MED ORDER — INSULIN ASPART PROT & ASPART (70-30 MIX) 100 UNIT/ML ~~LOC~~ SUSP
10.0000 [IU] | Freq: Two times a day (BID) | SUBCUTANEOUS | Status: DC
  Administered 2024-01-30 – 2024-01-31 (×2): 10 [IU] via SUBCUTANEOUS
  Filled 2024-01-30: qty 10

## 2024-01-30 MED ORDER — CARVEDILOL 12.5 MG PO TABS
12.5000 mg | ORAL_TABLET | Freq: Two times a day (BID) | ORAL | Status: DC
  Administered 2024-01-30 – 2024-02-01 (×4): 12.5 mg via ORAL
  Filled 2024-01-30 (×4): qty 1

## 2024-01-30 NOTE — Inpatient Diabetes Management (Signed)
 Inpatient Diabetes Program Recommendations  AACE/ADA: New Consensus Statement on Inpatient Glycemic Control (2015)  Target Ranges:  Prepandial:   less than 140 mg/dL      Peak postprandial:   less than 180 mg/dL (1-2 hours)      Critically ill patients:  140 - 180 mg/dL   Lab Results  Component Value Date   GLUCAP 324 (H) 01/30/2024   HGBA1C 7.0 (A) 10/08/2023    Latest Reference Range & Units 01/29/24 07:18 01/29/24 11:31 01/29/24 16:15 01/29/24 20:23 01/29/24 21:40 01/30/24 04:33 01/30/24 07:33  Glucose-Capillary 70 - 99 mg/dL 161 (H) 096 (H) 045 (H) 68 (L) 103 (H) 218 (H) 324 (H)  (H): Data is abnormally high (L): Data is abnormally low  Diabetes history: DM2 Outpatient Diabetes medications: Humalog  75/25 10 units BID; Dexcom G7 CGM Current orders for Inpatient glycemic control: Novolog  70/30 20 units bid ac meals, Novolog  0-6 units tid correction  Inpatient Diabetes Program Recommendations:   Patient did not receive pm dose of 70/30 insulin  due to lower CBGs. Please consider on discharge: -Decrease Novolog  70/30 10 bid Followup with endocrinologist Dr. Monte Antonio as soon as possible for reevaluation of insulin  doses.  Thank you, Laramie Meissner E. Abagale Boulos, RN, MSN, CDCES  Diabetes Coordinator Inpatient Glycemic Control Team Team Pager [phone] (8am-5pm) 01/30/2024 9:05 AM

## 2024-01-30 NOTE — Progress Notes (Signed)
 PROGRESS NOTE    Patient: Emily Jennings                            PCP: Towanda Fret, MD                    DOB: 07-01-1934            DOA: 01/22/2024 ZOX:[id number]             DOS: 01/30/2024, 5:01 PM   LOS: 8 days   Date of Service: The patient was seen and examined on 01/30/2024  Subjective:   -Husband at bedside - Patient with conversational dyspnea and dyspnea on exertion when ambulating to the bathroom -- Wet bed and unmeasured urine output from time to time -- Had episode of hypoglycemia last evening today sugars running higher (erratic blood sugars) -- BP remains elevated  -Possible discharge home with home health on 01/31/2024 with better BP control, more stable with blood sugar and with additional diuresis and stable renal function   Brief Narrative:    Emily Jennings is an 89 y.o. female with medical history significant of hypertension, T2DM, CHF, CAD, CKD stage IV who presents to the emergency department due to elevated blood sugar within last 2 to 3 days. She states that she has been compliant with her insulin  as prescribed.  Patient was admitted with hyperglycemia in the setting of type 2 diabetes as well as AKI on CKD stage V.  She was also noted to be hypothermic and hyponatremic ED Course:  BP was 152/117, temperature 94.58F.  CBC showed normocytic anemia, BMP showed sodium of 123, potassium 3.0, chloride 84, bicarb 26, blood glucose 356, BUN 78, creatinine 3.70, GFR 11.  Urinalysis was positive for glycosuria, magnesium  2.3 Patient was treated with 10 units of NovoLog , IV hydration was provided.     Assessment & Plan:   Principal Problem:   Type 2 diabetes mellitus with hyperglycemia (HCC) Active Problems:   Acute kidney injury superimposed on chronic kidney disease (HCC)   Hyponatremia   Hypothermia   Hypokalemia   Mixed hyperlipidemia   Essential hypertension   1)Accelerated hypertension w h/o hypertension - BP control remains difficult -Medications  adjusted as follows--amlodipine  10 mg daily, clonidine  0.2 mg 3 times daily, hydralazine  100 mg 3 times daily along with isosorbide  60 daily, torsemide 40 twice daily, Aldactone  25 daily, restart Coreg  at 12.5 mg twice daily, labetalol  has been discontinued - 2)HFpEF--chronic diastolic dysfunction CHF  - Echo from 05/08/2023 with EF of 65 to 70% with grade 2 diastolic dysfunction  - Fluid output/measurement is inaccurate due to patient being continued in bed and void into the toilet without measuring --Conversational dyspnea and dyspnea on exertion persist -Per nephrologist okay to continue torsemide 40 twice daily and Aldactone  25 mg daily monitor renal function closely  3)AKI----acute kidney injury on CKD stage -IV - baseline creatinine = creatinine was 2.31 on 11/29/2023   , creatinine was 2.93 on 01/25/2024 --Creatinine trending up with IV Lasix  - creatinine is now= currently stable around 3.6 after bumping up with diuresis, renally adjust medications, avoid nephrotoxic agents / dehydration  / hypotension  4)Type 2 diabetes mellitus with hyperglycemia - Brittle diabetes, with extreme episode of hyper/hypoglycemia-this is exacerbated by progressive renal failure --Blood glucose remains erratic with episodes of hypoglycemia alternating with episodes of hypoglycemia----?? some degree of insulin  stacking with worsening renal function - Holding-long-acting insulin  Semglee   -  Diabetic educator input appreciated recommends 70/30 insulin  10 units twice daily -Use Novolog /Humalog  Sliding scale insulin  with Accu-Cheks/Fingersticks as ordered   Hypothermia -resolved-  TSH normal at 2.6, improved glycemic control   -Mild persistent hyponatremia--stable,  Mixed hyperlipidemia Continue Crestor    Social/Ethics--plan of care and advance directive discussed with patient and husband at bedside, she remains a full code, overall prognosis is not great given significant comorbid conditions and advanced  age  ----------------------------------------------------------------------------------------------------------------------------------------------- Nutritional status:  The patient's BMI is: Body mass index is 24.41 kg/m. I agree with the assessment and plan as outlined -----------------------------------------------------------------------------------------------------------------  DVT prophylaxis:  heparin  injection 5,000 Units Start: 01/22/24 0845 SCDs Start: 01/22/24 0746   Code Status:   Code Status: Full Code  Family Communication: Husband present at bedside-updated  -Advance care planning has been discussed.   Admission status:   Status is: Inpatient  Disposition: From  - home  - -Possible discharge home with home health on 01/31/2024 with better BP control, more stable with blood sugar and with additional diuresis and stable renal function            Planning for discharge in 1 days: to home with home health  Procedures:   Antimicrobials:  Anti-infectives (From admission, onward)    None      Medication:   amLODipine   10 mg Oral Daily   carvedilol   12.5 mg Oral BID WC   cloNIDine   0.3 mg Oral TID   heparin   5,000 Units Subcutaneous Q8H   hydrALAZINE   100 mg Oral TID   insulin  aspart  0-6 Units Subcutaneous TID WC   insulin  aspart protamine- aspart  10 Units Subcutaneous BID WC   isosorbide  mononitrate  60 mg Oral Daily   magnesium  oxide  400 mg Oral Daily   polyethylene glycol  17 g Oral Daily   rosuvastatin   5 mg Oral Daily   sennosides  15 mL Oral BID   spironolactone   25 mg Oral Daily   torsemide  40 mg Oral BID    acetaminophen  **OR** acetaminophen , ALPRAZolam , hydrALAZINE , ondansetron  **OR** ondansetron  (ZOFRAN ) IV   Objective:   Vitals:   01/29/24 2021 01/30/24 0431 01/30/24 0453 01/30/24 1402  BP: (!) 141/55 (!) 171/67  (!) 162/58  Pulse: 64 73  69  Resp: 18 20  17   Temp: (!) 97.5 F (36.4 C) 97.9 F (36.6 C)  98.2 F (36.8 C)   TempSrc: Oral Oral  Oral  SpO2: 99% 97%  99%  Weight:   56.7 kg   Height:       No intake or output data in the 24 hours ending 01/30/24 1701  Filed Weights   01/28/24 0500 01/29/24 0405 01/30/24 0453  Weight: 57.7 kg 57.5 kg 56.7 kg     Physical examination:    Physical Exam  Gen:- Awake Alert, in no acute distress , some conversational dyspnea HEENT:- [place].AT, No sclera icterus Neck-Supple Neck,No JVD,.  Lungs-  No wheezing , fair air movement bilaterally  CV- S1, S2 normal, RRR Abd-  +ve B.Sounds, Abd Soft, No tenderness,    Extremity/Skin:- +ve  edema,   good pedal pulses  Psych-affect is appropriate, oriented x3 Neuro-generalized weakness, no new focal deficits, no tremors   LABs:     Latest Ref Rng & Units 01/23/2024    3:26 AM 01/22/2024    1:22 AM 07/27/2023    9:15 AM  CBC  WBC 4.0 - 10.5 K/uL 5.9  5.1  7.4   Hemoglobin 12.0 - 15.0  g/dL 16.1  9.4  9.2   Hematocrit 36.0 - 46.0 % 29.0  26.1  28.2   Platelets 150 - 400 K/uL 245  192  256       Latest Ref Rng & Units 01/30/2024    5:10 AM 01/29/2024    4:21 AM 01/28/2024    3:48 AM  CMP  Glucose 70 - 99 mg/dL 096  045  409   BUN 8 - 23 mg/dL 88  82  72   Creatinine 0.44 - 1.00 mg/dL 8.11  9.14  7.82   Sodium 135 - 145 mmol/L 131  131  132   Potassium 3.5 - 5.1 mmol/L 4.4  4.6  2.9   Chloride 98 - 111 mmol/L 92  93  92   CO2 22 - 32 mmol/L 26  26  29    Calcium  8.9 - 10.3 mg/dL 9.0  9.3  9.1    Micro Results Recent Results (from the past 240 hours)  MRSA Next Gen by PCR, Nasal     Status: None   Collection Time: 01/22/24  6:40 AM   Specimen: Nasal Mucosa; Nasal Swab  Result Value Ref Range Status   MRSA by PCR Next Gen NOT DETECTED NOT DETECTED Final    Comment: (NOTE) The GeneXpert MRSA Assay (FDA approved for NASAL specimens only), is one component of a comprehensive MRSA colonization surveillance program. It is not intended to diagnose MRSA infection nor to guide or monitor treatment for MRSA  infections. Test performance is not FDA approved in patients less than 31 years old. Performed at [facility], [address] [place], [place] [address]   Resp panel by RT-PCR (RSV, Flu A&B, Covid) Anterior Nasal Swab     Status: None   Collection Time: 01/22/24 12:53 PM   Specimen: Anterior Nasal Swab  Result Value Ref Range Status   SARS Coronavirus 2 by RT PCR NEGATIVE NEGATIVE Final    Comment: (NOTE) SARS-CoV-2 target nucleic acids are NOT DETECTED.  The SARS-CoV-2 RNA is generally detectable in upper respiratory specimens during the acute phase of infection. The lowest concentration of SARS-CoV-2 viral copies this assay can detect is 138 copies/mL. A negative result does not preclude SARS-Cov-2 infection and should not be used as the sole basis for treatment or other patient management decisions. A negative result may occur with  improper specimen collection/handling, submission of specimen other than nasopharyngeal swab, presence of viral mutation(s) within the areas targeted by this assay, and inadequate number of viral copies(<138 copies/mL). A negative result must be combined with clinical observations, patient history, and epidemiological information. The expected result is Negative.  Fact Sheet for Patients:  BloggerCourse.com  Fact Sheet for Healthcare Providers:  SeriousBroker.it  This test is no t yet approved or cleared by the [place]  FDA and  has been authorized for detection and/or diagnosis of SARS-CoV-2 by FDA under an Emergency Use Authorization (EUA). This EUA will remain  in effect (meaning this test can be used) for the duration of the COVID-19 declaration under Section 564(b)(1) of the Act, 21 U.S.C.section 360bbb-3(b)(1), unless the authorization is terminated  or revoked sooner.       Influenza A by PCR NEGATIVE NEGATIVE Final   Influenza B by PCR NEGATIVE NEGATIVE Final    Comment:  (NOTE) The Xpert Xpress SARS-CoV-2/FLU/RSV plus assay is intended as an aid in the diagnosis of influenza from Nasopharyngeal swab specimens and should not be used as a sole basis for treatment. Nasal washings and  aspirates are unacceptable for Xpert Xpress SARS-CoV-2/FLU/RSV testing.  Fact Sheet for Patients: BloggerCourse.com  Fact Sheet for Healthcare Providers: SeriousBroker.it  This test is not yet approved or cleared by the [place]  FDA and has been authorized for detection and/or diagnosis of SARS-CoV-2 by FDA under an Emergency Use Authorization (EUA). This EUA will remain in effect (meaning this test can be used) for the duration of the COVID-19 declaration under Section 564(b)(1) of the Act, 21 U.S.C. section 360bbb-3(b)(1), unless the authorization is terminated or revoked.     Resp Syncytial Virus by PCR NEGATIVE NEGATIVE Final    Comment: (NOTE) Fact Sheet for Patients: BloggerCourse.com  Fact Sheet for Healthcare Providers: SeriousBroker.it  This test is not yet approved or cleared by the [place]  FDA and has been authorized for detection and/or diagnosis of SARS-CoV-2 by FDA under an Emergency Use Authorization (EUA). This EUA will remain in effect (meaning this test can be used) for the duration of the COVID-19 declaration under Section 564(b)(1) of the Act, 21 U.S.C. section 360bbb-3(b)(1), unless the authorization is terminated or revoked.  Performed at [facility], [address], [place], [place] [address]    SIGNED: Colin Dawley, MD,   Arlin Benes - [facility] hospitalist   If 7PM-7AM, please contact night-coverage [email or web address], 01/30/2024, 5:01 PM

## 2024-01-30 NOTE — Progress Notes (Signed)
  [facility] Progress Note    Assessment/ Plan:   # Acute kidney injury on CKD stage IV with chronic proteinuria: Baseline creatinine around 2.3-2.9.  Rising creatinine likely associated with some optimization of blood pressure and volume status.  Some degree of cardiorenal syndrome as well as progression of her CKD.  Followed by Dr. Carrolyn Clan as outpatient. No obstruction on u/s.     # Hypertensive urgency: BP remains poorly controlled Multiple antihypertensives -continue torsemide 40mg  BID - spironolactone  added yesterday- will need to keep a close eye on Cr - may need to change labetalol  to carvedilol    # Type II DM with hyperglycemia and hypoglycemia: Adjustment of insulin  by primary team.   # Acute on chronic diastolic CHF with fluid overload: Echo with preserved EF however has grade 2 diastolic dysfunction.  Diuretics as above   # False hyponatremia: corrects to normal when accounting for glucose  #Hypokalemia: f/u labs from today. Start spiro as above  Subjective:    Continued SOB.  Husband at bedside.  Cr is stable from yesterday   Objective:   BP (!) 171/67 (BP Location: Left Arm)   Pulse 73   Temp 97.9 F (36.6 C) (Oral)   Resp 20   Ht 5' (1.524 m)   Wt 56.7 kg   SpO2 97%   BMI 24.41 kg/m  No intake or output data in the 24 hours ending 01/30/24 0956  Weight change: -0.8 kg  Physical Exam: Gen: NAD, sitting up in bed CVS: Normal rate, no rub Resp: Bilateral chest rise, mild increased work of breathing Abd: soft Ext: 1+ pitting edema b/l Les, warm and well-perfused Neuro: awake, alert, hard of hearing  Imaging: No results found.  Labs: BMET Recent Labs  Lab 01/24/24 0847 01/25/24 0513 01/26/24 0416 01/27/24 0413 01/27/24 2206 01/28/24 0348 01/29/24 0421 01/30/24 0510  NA 130* 134* 130* 131*  --  132* 131* 131*  K 3.1* 4.0 3.9 3.5  --  2.9* 4.6 4.4  CL 94* 100 94* 93*  --  92* 93* 92*  CO2 28 29 27 27   --  29 26 26   GLUCOSE 98  24* 282* 196* 98 115* 290* 219*  BUN 58* 64* 66* 72*  --  72* 82* 88*  CREATININE 3.09* 2.93* 2.73* 3.23*  --  3.17* 3.60* 3.58*  CALCIUM  8.4* 8.3* 8.6* 8.9  --  9.1 9.3 9.0   CBC No results for input(s): "WBC", "NEUTROABS", "HGB", "HCT", "MCV", "PLT" in the last 168 hours.   Medications:     amLODipine   10 mg Oral Daily   cloNIDine   0.3 mg Oral TID   heparin   5,000 Units Subcutaneous Q8H   hydrALAZINE   100 mg Oral TID   insulin  aspart  0-6 Units Subcutaneous TID WC   insulin  aspart protamine- aspart  10 Units Subcutaneous BID WC   isosorbide  mononitrate  60 mg Oral Daily   labetalol   300 mg Oral BID   magnesium  oxide  400 mg Oral Daily   polyethylene glycol  17 g Oral Daily   rosuvastatin   5 mg Oral Daily   sennosides  15 mL Oral BID   spironolactone   25 mg Oral Daily   torsemide  40 mg Oral BID     [facility] 01/30/2024, 9:56 AM

## 2024-01-30 NOTE — Progress Notes (Signed)
 Palliative: Chart review completed.  Face-to-face conference with bedside nursing staff and transition of care team related to patient condition, needs, goals of care, disposition.  At this point, Emily Jennings goals are set for full scope/full code.  Return home when possible with home health services.  No charge Arla Lab, NP Palliative medicine team Team phone [phone]

## 2024-01-30 NOTE — Plan of Care (Signed)

## 2024-01-30 NOTE — Plan of Care (Addendum)
 Pt is alert to self and place. Frequent soft bm during the night. Xanax  given prn due to pt tearing and restless. Vitals stable. Had hypoglycemic episode and glucose gel given and effective. MD aware.  Problem: Education: Goal: Knowledge of General Education information will improve Description: Including pain rating scale, medication(s)/side effects and non-pharmacologic comfort measures Outcome: Progressing   Problem: Health Behavior/Discharge Planning: Goal: Ability to manage health-related needs will improve Outcome: Progressing   Problem: Clinical Measurements: Goal: Ability to maintain clinical measurements within normal limits will improve Outcome: Progressing Goal: Will remain free from infection Outcome: Progressing Goal: Diagnostic test results will improve Outcome: Progressing Goal: Respiratory complications will improve Outcome: Progressing Goal: Cardiovascular complication will be avoided Outcome: Progressing   Problem: Activity: Goal: Risk for activity intolerance will decrease Outcome: Progressing   Problem: Nutrition: Goal: Adequate nutrition will be maintained Outcome: Progressing   Problem: Coping: Goal: Level of anxiety will decrease Outcome: Progressing   Problem: Elimination: Goal: Will not experience complications related to bowel motility Outcome: Progressing Goal: Will not experience complications related to urinary retention Outcome: Progressing   Problem: Pain Managment: Goal: General experience of comfort will improve and/or be controlled Outcome: Progressing   Problem: Safety: Goal: Ability to remain free from injury will improve Outcome: Progressing   Problem: Skin Integrity: Goal: Risk for impaired skin integrity will decrease Outcome: Progressing   Problem: Education: Goal: Ability to describe self-care measures that may prevent or decrease complications (Diabetes Survival Skills Education) will improve Outcome: Progressing Goal:  Individualized Educational Video(s) Outcome: Progressing   Problem: Coping: Goal: Ability to adjust to condition or change in health will improve Outcome: Progressing   Problem: Fluid Volume: Goal: Ability to maintain a balanced intake and output will improve Outcome: Progressing   Problem: Health Behavior/Discharge Planning: Goal: Ability to identify and utilize available resources and services will improve Outcome: Progressing Goal: Ability to manage health-related needs will improve Outcome: Progressing   Problem: Metabolic: Goal: Ability to maintain appropriate glucose levels will improve Outcome: Progressing   Problem: Nutritional: Goal: Maintenance of adequate nutrition will improve Outcome: Progressing Goal: Progress toward achieving an optimal weight will improve Outcome: Progressing   Problem: Skin Integrity: Goal: Risk for impaired skin integrity will decrease Outcome: Progressing   Problem: Tissue Perfusion: Goal: Adequacy of tissue perfusion will improve Outcome: Progressing   Problem: Education: Goal: Ability to describe self-care measures that may prevent or decrease complications (Diabetes Survival Skills Education) will improve Outcome: Progressing Goal: Individualized Educational Video(s) Outcome: Progressing   Problem: Coping: Goal: Ability to adjust to condition or change in health will improve Outcome: Progressing   Problem: Fluid Volume: Goal: Ability to maintain a balanced intake and output will improve Outcome: Progressing   Problem: Health Behavior/Discharge Planning: Goal: Ability to identify and utilize available resources and services will improve Outcome: Progressing Goal: Ability to manage health-related needs will improve Outcome: Progressing   Problem: Metabolic: Goal: Ability to maintain appropriate glucose levels will improve Outcome: Progressing   Problem: Nutritional: Goal: Maintenance of adequate nutrition will  improve Outcome: Progressing Goal: Progress toward achieving an optimal weight will improve Outcome: Progressing   Problem: Skin Integrity: Goal: Risk for impaired skin integrity will decrease Outcome: Progressing   Problem: Tissue Perfusion: Goal: Adequacy of tissue perfusion will improve Outcome: Progressing

## 2024-01-30 NOTE — Progress Notes (Signed)
 Physical Therapy Treatment Patient Details Name: Emily Jennings MRN: [id number] DOB: November 16, 1933 Today's Date: 01/30/2024   History of Present Illness MERVIN SANDIFER is an 89 y.o. female with medical history significant of hypertension, T2DM, CHF, CAD, CKD stage IV who presents to the emergency department due to elevated blood sugar within last 2 to 3 days. She states that she has been compliant with her insulin  as prescribed.  Patient denies chest pain, shortness of breath.  She endorsed right hand swelling.    PT Comments  Patient demonstrates slow labored movement for sitting up at bedside, fatigues easily while completing BLE exercises seated at bedside, slightly increased endurance/distance for taking steps forward/backwards at bedside, but limited mostly due to fatigue and generalized weakness. Patient tolerated sitting up in chair after therapy - nursing staff notified. Patient will benefit from continued skilled physical therapy in hospital and recommended venue below to increase strength, balance, endurance for safe ADLs and gait.       If plan is discharge home, recommend the following: A lot of help with walking and/or transfers;A little help with bathing/dressing/bathroom;Help with stairs or ramp for entrance;Assistance with cooking/housework   Can travel by private vehicle     Yes  Equipment Recommendations  None recommended by PT    Recommendations for Other Services       Precautions / Restrictions Precautions Precautions: Fall Recall of Precautions/Restrictions: Impaired Restrictions Weight Bearing Restrictions Per Provider Order: No     Mobility  Bed Mobility Overal bed mobility: Needs Assistance Bed Mobility: Supine to Sit     Supine to sit: Min assist     General bed mobility comments: labored movement, increased time with HOB flat    Transfers Overall transfer level: Needs assistance Equipment used: Rolling walker (2 wheels) Transfers: Sit to/from Stand,  Bed to chair/wheelchair/BSC Sit to Stand: Min assist   Step pivot transfers: Min assist, Mod assist       General transfer comment: unsteady labored movement    Ambulation/Gait Ambulation/Gait assistance: Mod assist Gait Distance (Feet): 12 Feet Assistive device: Rolling walker (2 wheels) Gait Pattern/deviations: Decreased step length - right, Decreased step length - left, Decreased stride length Gait velocity: slow     General Gait Details: limited to a few unsteay labored steps forward, backwards at bedside before having to sit due to c/o fatigue, SOB   Stairs             Wheelchair Mobility     Tilt Bed    Modified Rankin (Stroke Patients Only)       Balance Overall balance assessment: Needs assistance Sitting-balance support: Feet supported, No upper extremity supported Sitting balance-Leahy Scale: Fair Sitting balance - Comments: fair/good seated at EOB   Standing balance support: Reliant on assistive device for balance, During functional activity, Bilateral upper extremity supported Standing balance-Leahy Scale: Poor Standing balance comment: fair/poor using RW                            Communication Communication Communication: Impaired Factors Affecting Communication: Reduced clarity of speech  Cognition Arousal: Alert Behavior During Therapy: WFL for tasks assessed/performed   PT - Cognitive impairments: No apparent impairments, Difficult to assess                         Following commands: Impaired Following commands impaired: Follows one step commands with increased time, Follows multi-step commands inconsistently  Cueing Cueing Techniques: Verbal cues, Tactile cues  Exercises General Exercises - Lower Extremity Long Arc Quad: Seated, AROM, Strengthening, 10 reps Hip Flexion/Marching: Seated, AROM, Strengthening, 10 reps, Both Toe Raises: Seated, AROM, Strengthening, 10 reps, Both Heel Raises: Seated, AROM,  Strengthening, 10 reps, Both    General Comments        Pertinent Vitals/Pain Pain Assessment Pain Assessment: No/denies pain    Home Living                          Prior Function            PT Goals (current goals can now be found in the care plan section) Acute Rehab PT Goals Patient Stated Goal: to get stronger PT Goal Formulation: With patient Time For Goal Achievement: 02/02/24 Potential to Achieve Goals: Good Progress towards PT goals: Progressing toward goals    Frequency    Min 3X/week      PT Plan      Co-evaluation              AM-PAC PT "6 Clicks" Mobility   Outcome Measure  Help needed turning from your back to your side while in a flat bed without using bedrails?: A Little Help needed moving from lying on your back to sitting on the side of a flat bed without using bedrails?: A Lot Help needed moving to and from a bed to a chair (including a wheelchair)?: A Lot Help needed standing up from a chair using your arms (e.g., wheelchair or bedside chair)?: A Lot Help needed to walk in hospital room?: A Lot Help needed climbing 3-5 steps with a railing? : A Lot 6 Click Score: 13    End of Session   Activity Tolerance: Patient tolerated treatment well;Patient limited by fatigue Patient left: in chair;with call bell/phone within reach;with chair alarm set Nurse Communication: Mobility status PT Visit Diagnosis: Unsteadiness on feet (R26.81);Other abnormalities of gait and mobility (R26.89);Muscle weakness (generalized) (M62.81)     Time: 0102-7253 PT Time Calculation (min) (ACUTE ONLY): 30 min  Charges:    $Therapeutic Exercise: 8-22 mins $Therapeutic Activity: 8-22 mins PT General Charges $$ ACUTE PT VISIT: 1 Visit                     3:55 PM, 01/30/24 Walton Guppy, MPT Physical Therapist with [facility] [phone] office [phone] mobile phone

## 2024-01-30 NOTE — Progress Notes (Signed)
 Hypoglycemic Event  CBG: 68  Treatment: 1 tube glucose gel  Symptoms: None  Follow-up CBG: Time 2140 CBG Result:103  Possible Reasons for Event: Inadequate meal intake  Comments/MD notified:Dr. Segars     Larence Thone F Ege Muckey

## 2024-01-31 ENCOUNTER — Inpatient Hospital Stay (HOSPITAL_COMMUNITY)

## 2024-01-31 DIAGNOSIS — E1165 Type 2 diabetes mellitus with hyperglycemia: Secondary | ICD-10-CM | POA: Diagnosis not present

## 2024-01-31 DIAGNOSIS — N179 Acute kidney failure, unspecified: Secondary | ICD-10-CM | POA: Diagnosis not present

## 2024-01-31 DIAGNOSIS — E871 Hypo-osmolality and hyponatremia: Secondary | ICD-10-CM | POA: Diagnosis not present

## 2024-01-31 DIAGNOSIS — I10 Essential (primary) hypertension: Secondary | ICD-10-CM | POA: Diagnosis not present

## 2024-01-31 LAB — RENAL FUNCTION PANEL
Albumin: 2.7 g/dL — ABNORMAL LOW (ref 3.5–5.0)
Anion gap: 9 (ref 5–15)
BUN: 93 mg/dL — ABNORMAL HIGH (ref 8–23)
CO2: 29 mmol/L (ref 22–32)
Calcium: 8.9 mg/dL (ref 8.9–10.3)
Chloride: 94 mmol/L — ABNORMAL LOW (ref 98–111)
Creatinine, Ser: 3.5 mg/dL — ABNORMAL HIGH (ref 0.44–1.00)
GFR, Estimated: 12 mL/min — ABNORMAL LOW (ref >60)
Glucose, Bld: 68 mg/dL — ABNORMAL LOW (ref 70–99)
Phosphorus: 5.5 mg/dL — ABNORMAL HIGH (ref 2.5–4.6)
Potassium: 4.1 mmol/L (ref 3.5–5.1)
Sodium: 132 mmol/L — ABNORMAL LOW (ref 135–145)

## 2024-01-31 LAB — GLUCOSE, CAPILLARY
Glucose-Capillary: 24 mg/dL — CL (ref 70–99)
Glucose-Capillary: 27 mg/dL — CL (ref 70–99)
Glucose-Capillary: 101 mg/dL — ABNORMAL HIGH (ref 70–99)
Glucose-Capillary: 78 mg/dL (ref 70–99)
Glucose-Capillary: 111 mg/dL — ABNORMAL HIGH (ref 70–99)
Glucose-Capillary: 250 mg/dL — ABNORMAL HIGH (ref 70–99)
Glucose-Capillary: 360 mg/dL — ABNORMAL HIGH (ref 70–99)
Glucose-Capillary: 285 mg/dL — ABNORMAL HIGH (ref 70–99)
Glucose-Capillary: 292 mg/dL — ABNORMAL HIGH (ref 70–99)
Glucose-Capillary: 214 mg/dL — ABNORMAL HIGH (ref 70–99)
Glucose-Capillary: 109 mg/dL — ABNORMAL HIGH (ref 70–99)
Glucose-Capillary: 78 mg/dL (ref 70–99)

## 2024-01-31 MED ORDER — INSULIN GLARGINE-YFGN 100 UNIT/ML ~~LOC~~ SOLN
6.0000 [IU] | Freq: Every day | SUBCUTANEOUS | Status: DC
  Administered 2024-02-01: 6 [IU] via SUBCUTANEOUS
  Filled 2024-01-31 (×2): qty 0.06

## 2024-01-31 MED ORDER — DEXTROSE 50 % IV SOLN
25.0000 g | INTRAVENOUS | Status: AC
  Administered 2024-01-31: 25 g via INTRAVENOUS
  Filled 2024-01-31: qty 50

## 2024-01-31 MED ORDER — FUROSEMIDE 10 MG/ML IJ SOLN
80.0000 mg | Freq: Once | INTRAMUSCULAR | Status: AC
  Administered 2024-01-31: 80 mg via INTRAVENOUS
  Filled 2024-01-31: qty 8

## 2024-01-31 NOTE — Progress Notes (Signed)
 [facility] Progress Note    Assessment/ Plan:   # Acute kidney injury on CKD stage IV with chronic proteinuria: Baseline creatinine around 2.3-2.9.  Rising creatinine likely associated with some optimization of blood pressure and volume status.  Some degree of cardiorenal syndrome as well as progression of her CKD.  Followed by Dr. Carrolyn Clan as outpatient. No obstruction on u/s.     # Hypertensive urgency: BP somewhat better Multiple antihypertensives -continue torsemide 40mg  BID - spironolactone  added- will need to keep a close eye on Cr - may need to change labetalol  to carvedilol    # Type II DM with hyperglycemia and hypoglycemia: Adjustment of insulin  by primary team.   # Acute on chronic diastolic CHF with fluid overload: Echo with preserved EF however has grade 2 diastolic dysfunction.  Diuretics as above   # False hyponatremia: corrects to normal when accounting for glucose  #Hypokalemia:better  Subjective:    SOB improved.  Low Blood sugar this AM, treated.  In chair today.  Cr stable.      Objective:   BP (!) 174/72 (BP Location: Left Arm)   Pulse (!) 57   Temp (!) 97.5 F (36.4 C) (Oral)   Resp 16   Ht 5' (1.524 m)   Wt 56.7 kg   SpO2 100%   BMI 24.41 kg/m   Intake/Output Summary (Last 24 hours) at 01/31/2024 0941 Last data filed at 01/30/2024 2101 Gross per 24 hour  Intake 240 ml  Output --  Net 240 ml    Weight change:   Physical Exam: Gen: NAD, sitting up in bed CVS: Normal rate, no rub Resp: Bilateral chest rise, mild increased work of breathing Abd: soft Ext: 1+ pitting edema b/l Les, warm and well-perfused Neuro: awake, alert, hard of hearing  Imaging: DG CHEST PORT 1 VIEW Result Date: 01/31/2024 CLINICAL DATA:  [id number].  Dyspnea and respiratory abnormalities. EXAM: PORTABLE CHEST 1 VIEW COMPARISON:  Portable chest 01/22/2024 FINDINGS: The lungs are emphysematous with mild chronic interstitial change. Stable moderate  cardiomegaly. Interval increased central vessel prominence, increased central interstitial consolidation suggesting early interstitial edema. Trace pleural effusions. Findings consistent with a mild CHF pattern. No focal pneumonia is seen. Stable mediastinum. The aorta is tortuous and calcified. Osteopenia and thoracic spondylosis. IMPRESSION: 1. Mild CHF pattern.  Trace pleural effusions. 2. Emphysema and chronic interstitial change. 3. Aortic atherosclerosis. Electronically Signed   By: Denman Fischer M.D.   On: 01/31/2024 05:35    Labs: BMET Recent Labs  Lab 01/25/24 0513 01/26/24 0416 01/27/24 0413 01/27/24 2206 01/28/24 0348 01/29/24 0421 01/30/24 0510 01/31/24 0446  NA 134* 130* 131*  --  132* 131* 131* 132*  K 4.0 3.9 3.5  --  2.9* 4.6 4.4 4.1  CL 100 94* 93*  --  92* 93* 92* 94*  CO2 29 27 27   --  29 26 26 29   GLUCOSE 24* 282* 196* 98 115* 290* 219* 68*  BUN 64* 66* 72*  --  72* 82* 88* 93*  CREATININE 2.93* 2.73* 3.23*  --  3.17* 3.60* 3.58* 3.50*  CALCIUM  8.3* 8.6* 8.9  --  9.1 9.3 9.0 8.9  PHOS  --   --   --   --   --   --   --  5.5*   CBC No results for input(s): "WBC", "NEUTROABS", "HGB", "HCT", "MCV", "PLT" in the last 168 hours.   Medications:     amLODipine   10 mg Oral Daily   carvedilol   12.5 mg Oral BID WC   cloNIDine   0.3 mg Oral TID   heparin   5,000 Units Subcutaneous Q8H   hydrALAZINE   100 mg Oral TID   insulin  aspart  0-6 Units Subcutaneous TID WC   insulin  aspart protamine- aspart  10 Units Subcutaneous BID WC   isosorbide  mononitrate  60 mg Oral Daily   magnesium  oxide  400 mg Oral Daily   polyethylene glycol  17 g Oral Daily   rosuvastatin   5 mg Oral Daily   sennosides  15 mL Oral BID   spironolactone   25 mg Oral Daily   torsemide  40 mg Oral BID     [facility] 01/31/2024, 9:41 AM

## 2024-01-31 NOTE — Evaluation (Signed)
 Clinical/Bedside Swallow Evaluation Patient Details  Name: Emily Jennings MRN: [id number] Date of Birth: 03-24-1934  Today's Date: 01/31/2024 Time: SLP Start Time (ACUTE ONLY): 1420 SLP Stop Time (ACUTE ONLY): 1438 SLP Time Calculation (min) (ACUTE ONLY): 18 min  Past Medical History:  Past Medical History:  Diagnosis Date   Allergy    Anxiety    ANXIETY DISORDER, GENERALIZED 07/15/2007   Qualifier: Diagnosis of  By: Michaell Adolph     Arthritis    [facility]   breast, carcinoma in situ left   Carcinoma in situ of breast 05/21/2008   Qualifier: Diagnosis of  By: Rodolph Clap MD, Margaret  Diagnosed in 2009, completed 5 year course of tamoxifen, no evidence of recurrence    Carotid stenosis    11/16/2005  mild plaque formation and stenosis proximal right ECA   Cataract    Complication of anesthesia    Coronary artery disease    cardiac catheterization on 03/20/2006  LAD mid 40% stenosis, left circumflex mild 40% stenosis, RCA mid-vessel 40% to 50% lesion   EF 60%   Diabetes mellitus    GERD (gastroesophageal reflux disease)    Hernia, inguinal    left   Hyperglycemia    Hypertension    Insomnia 11/16/2011   Low blood potassium    Non-insulin  dependent type 2 diabetes mellitus (HCC)    Osteoporosis    Shortness of breath    2D Echocardiogram 01/26/2009   EF of greater than 55%, mild MR, mild TR, normal ventricular function   Thickened endometrium 10/26/2017   Noted by gyne in 2017, missed 6 month follow up, referred in 09/2017   Ventricular tachycardia, non-sustained (HCC)    developed during stress test 02/08/2006, spontaneously aborted, mild reversible apical defect   Past Surgical History:  Past Surgical History:  Procedure Laterality Date   BOWEL RESECTION Left 07/19/2023   Procedure: SMALL BOWEL RESECTION;  Surgeon: Awilda Bogus, MD;  Location: [facility];  Service: General;  Laterality: Left;   BREAST LUMPECTOMY Left 2009   Left breast 2009   CATARACT EXTRACTION  W/PHACO Left 10/28/2014   Procedure: PHACO EMULSION CATARACT EXTRACTION WITH INTRAOCULAR LENS IMPLANT LEFT EYE (IOC);  Surgeon: Ben Bracken, MD;  Location: [facility];  Service: Ophthalmology;  Laterality: Left;   COLONOSCOPY     cyst removed from left foot     INGUINAL HERNIA REPAIR Left 07/19/2023   Procedure: HERNIA REPAIR FEMORAL INCARCERATED;  Surgeon: Awilda Bogus, MD;  Location: [facility];  Service: General;  Laterality: Left;   REFRACTIVE SURGERY Left    HPI:  Emily Jennings is an 89 y.o. female with medical history significant of hypertension, T2DM, CHF, CAD, CKD stage IV who presents to the emergency department due to elevated blood sugar within last 2 to 3 days. She states that she has been compliant with her insulin  as prescribed.  Patient was admitted with hyperglycemia in the setting of type 2 diabetes as well as AKI on CKD stage V.  She was also noted to be hypothermic and hyponatremic. Social/Ethics--plan of care and advance directive discussed with patient and husband at bedside, she remains a full code, overall prognosis is not great given significant comorbid conditions and advanced age. Patient with conversational dyspnea and dyspnea on exertion when ambulating to the bathroom. Current chest xray shows: Mild CHF pattern.Trace pleural effusions. Emphysema and chronic interstitial change. Aortic atherosclerosis.  BSE ordered.    Assessment / Plan / Recommendation  Clinical Impression  Clinical  swallow evaluation completed at bedside. Pt reports that her upper dentures do not fit well due to weight loss and this makes it difficult to chew her foods. She denies any other difficulties related to swallowing. Pt shows no overt signs of aspiration, however she did need to expectorate raw apple due to it gathering beneath her upper dentures. SLP obtained denture cream and assisted Pt in applying and she was able to secure well. Pt with some eructation after her PO trials and stated this is  typical for her. Recommend D3/mech soft and thin liquids, medications whole with water with standard aspiration and reflux precautions. Pt advised that she may be better off eating without her dentures if they do not fit sercurely and to consume soft solids. SLP will sign off. SLP Visit Diagnosis: Dysphagia, unspecified (R13.10)    Aspiration Risk  No limitations    Diet Recommendation Dysphagia 3 (Mech soft);Thin liquid    Liquid Administration via: Cup;Straw Medication Administration: Whole meds with liquid Supervision: Patient able to self feed;Intermittent supervision to cue for compensatory strategies Compensations: Slow rate Postural Changes: Seated upright at 90 degrees;Remain upright for at least 30 minutes after po intake    Other  Recommendations Oral Care Recommendations: Oral care BID    Recommendations for follow up therapy are one component of a multi-disciplinary discharge planning process, led by the attending physician.  Recommendations may be updated based on patient status, additional functional criteria and insurance authorization.  Follow up Recommendations No SLP follow up      Assistance Recommended at Discharge    Functional Status Assessment Patient has not had a recent decline in their functional status  Frequency and Duration            Prognosis Prognosis for improved oropharyngeal function: Good Barriers/Prognosis Comment: ill fitting dentures      Swallow Study   General Date of Onset: 01/22/24 HPI: Emily Jennings is an 89 y.o. female with medical history significant of hypertension, T2DM, CHF, CAD, CKD stage IV who presents to the emergency department due to elevated blood sugar within last 2 to 3 days. She states that she has been compliant with her insulin  as prescribed.  Patient was admitted with hyperglycemia in the setting of type 2 diabetes as well as AKI on CKD stage V.  She was also noted to be hypothermic and hyponatremic. Social/Ethics--plan  of care and advance directive discussed with patient and husband at bedside, she remains a full code, overall prognosis is not great given significant comorbid conditions and advanced age. Patient with conversational dyspnea and dyspnea on exertion when ambulating to the bathroom. Current chest xray shows: Mild CHF pattern.Trace pleural effusions. Emphysema and chronic interstitial change. Aortic atherosclerosis.  BSE ordered. Type of Study: Bedside Swallow Evaluation Previous Swallow Assessment: BSE 07/04/2023 D2/thin with suspected esophageal involvement Diet Prior to this Study: Regular;Thin liquids (Level 0) Temperature Spikes Noted: No Respiratory Status: Room air History of Recent Intubation: No Behavior/Cognition: Alert;Cooperative;Pleasant mood Oral Cavity Assessment: Within Functional Limits Oral Care Completed by SLP: Yes Oral Cavity - Dentition: Dentures, top;Dentures, bottom (ill fitting upper dentures, provided dentures cream) Vision: Functional for self-feeding Self-Feeding Abilities: Able to feed self Patient Positioning: Upright in bed Baseline Vocal Quality: Normal Volitional Cough: Strong Volitional Swallow: Able to elicit    Oral/Motor/Sensory Function Overall Oral Motor/Sensory Function: Within functional limits   Ice Chips Ice chips: Within functional limits Presentation: Spoon   Thin Liquid Thin Liquid: Within functional limits Presentation: Cup;Straw;Self Fed  Nectar Thick Nectar Thick Liquid: Not tested   Honey Thick Honey Thick Liquid: Not tested   Puree Puree: Within functional limits Presentation: Spoon   Solid     Solid: Impaired Presentation: Self Fed Oral Phase Impairments: Impaired mastication Oral Phase Functional Implications: Prolonged oral transit;Oral residue     Thank you,  Claudetta Cuba, CCC-SLP [phone]  Aly Seidenberg 01/31/2024,3:06 PM

## 2024-01-31 NOTE — Progress Notes (Signed)
 PROGRESS NOTE    Patient: Emily Jennings                            PCP: Towanda Fret, MD                    DOB: 1934/09/09            DOA: 01/22/2024 [id number]             DOS: 01/31/2024, 6:50 PM   LOS: 9 days   Date of Service: The patient was seen and examined on 01/31/2024  Subjective:   Episode of hypoglycemia glucose 24 , repeat 27,  Oral intake is fair--  No Nausea, Vomiting or Diarrhea  BP also erractic  Brief Narrative:    Emily Jennings is an 89 y.o. female with medical history significant of hypertension, T2DM, CHF, CAD, CKD stage IV who presents to the emergency department due to elevated blood sugar within last 2 to 3 days. She states that she has been compliant with her insulin  as prescribed.  Patient was admitted with hyperglycemia in the setting of type 2 diabetes as well as AKI on CKD stage V.  She was also noted to be hypothermic and hyponatremic ED Course:  BP was 152/117, temperature 94.86F.  CBC showed normocytic anemia, BMP showed sodium of 123, potassium 3.0, chloride 84, bicarb 26, blood glucose 356, BUN 78, creatinine 3.70, GFR 11.  Urinalysis was positive for glycosuria, magnesium  2.3 Patient was treated with 10 units of NovoLog , IV hydration was provided.    Assessment & Plan:   Principal Problem:   Type 2 diabetes mellitus with hyperglycemia (HCC) Active Problems:   Acute kidney injury superimposed on chronic kidney disease (HCC)   Hyponatremia   Hypothermia   Hypokalemia   Mixed hyperlipidemia   Essential hypertension   1)Accelerated hypertension w h/o hypertension - BP control remains difficult -Medications adjusted as follows--amlodipine  10 mg daily, clonidine  0.2 mg 3 times daily, hydralazine  100 mg 3 times daily along with isosorbide  60 daily, torsemide 40 twice daily, Aldactone  25 daily, restart Coreg  at 12.5 mg twice daily, labetalol  has been discontinued -BP erratic---systolic BP down from the 170s to the 110s - 2)HFpEF--chronic  diastolic dysfunction CHF  - Echo from 05/08/2023 with EF of 65 to 70% with grade 2 diastolic dysfunction  - Fluid output/measurement is inaccurate due to patient being continued in bed and void into the toilet without measuring --Conversational dyspnea and dyspnea on exertion persist -Per nephrologist okay to continue torsemide 40 twice daily and Aldactone  25 mg daily monitor renal function closely - Patient given one-time dose of IV Lasix   3)AKI----acute kidney injury on CKD stage -IV - baseline creatinine = creatinine was 2.31 on 11/29/2023   , creatinine was 2.93 on 01/25/2024 --Creatinine trending up with IV Lasix  - creatinine is now= currently stable around 3.6 after bumping up with diuresis, renally adjust medications, avoid nephrotoxic agents / dehydration  / hypotension  4)Type 2 diabetes mellitus with hyperglycemia - Brittle diabetes, with extreme episode of hyper/hypoglycemia-this is exacerbated by progressive renal failure --Blood glucose remains erratic with episodes of hypoglycemia alternating with episodes of hypoglycemia----?? some degree of insulin  stacking with worsening renal function 01/31/24 Episode of hypoglycemia glucose 24 , repeat 27,   - After further conversations with diabetic educator will discontinue 70/30 insulin  10 units twice daily, start Semglee  6 units daily instead--- at discharge plan to discharge on  Tresiba  -Use Novolog /Humalog  Sliding scale insulin  with Accu-Cheks/Fingersticks as ordered   Hypothermia -resolved-  TSH normal at 2.6, improved glycemic control   -Mild persistent hyponatremia--stable,  Mixed hyperlipidemia Continue Crestor    Social/Ethics--plan of care and advance directive discussed with patient and husband at bedside, she remains a full code, overall prognosis is not great given significant comorbid conditions and advanced  age  ----------------------------------------------------------------------------------------------------------------------------------------------- Nutritional status:  The patient's BMI is: Body mass index is 24.41 kg/m. I agree with the assessment and plan as outlined -----------------------------------------------------------------------------------------------------------------  DVT prophylaxis:  heparin  injection 5,000 Units Start: 01/22/24 0845 SCDs Start: 01/22/24 0746   Code Status:   Code Status: Full Code  Family Communication: Husband present at bedside-updated  -Advance care planning has been discussed.   Admission status:   Status is: Inpatient  Disposition: From  - home  - -Possible discharge home with home health on 02/01/2024 with better BP control, more stable with blood sugar and with additional diuresis and stable renal function            Planning for discharge in 1 days: to home with home health  Procedures:   Antimicrobials:  Anti-infectives (From admission, onward)    None      Medication:   amLODipine   10 mg Oral Daily   carvedilol   12.5 mg Oral BID WC   cloNIDine   0.3 mg Oral TID   furosemide   80 mg Intravenous Once   heparin   5,000 Units Subcutaneous Q8H   hydrALAZINE   100 mg Oral TID   insulin  aspart  0-6 Units Subcutaneous TID WC   [START ON 02/01/2024] insulin  glargine-yfgn  6 Units Subcutaneous Daily   isosorbide  mononitrate  60 mg Oral Daily   magnesium  oxide  400 mg Oral Daily   polyethylene glycol  17 g Oral Daily   rosuvastatin   5 mg Oral Daily   sennosides  15 mL Oral BID   spironolactone   25 mg Oral Daily   torsemide  40 mg Oral BID    acetaminophen  **OR** acetaminophen , ALPRAZolam , hydrALAZINE , ondansetron  **OR** ondansetron  (ZOFRAN ) IV   Objective:   Vitals:   01/30/24 1402 01/30/24 2017 01/31/24 0338 01/31/24 1303  BP: (!) 162/58 (!) 144/62 (!) 174/72 (!) 119/56  Pulse: 69 (!) 58 (!) 57 (!) 58  Resp: 17 16  17    Temp: 98.2 F (36.8 C) (!) 97.5 F (36.4 C)  97.7 F (36.5 C)  TempSrc: Oral Oral  Oral  SpO2: 99% 100%  99%  Weight:      Height:        Intake/Output Summary (Last 24 hours) at 01/31/2024 1850 Last data filed at 01/31/2024 1300 Gross per 24 hour  Intake 240 ml  Output 475 ml  Net -235 ml    Filed Weights   01/28/24 0500 01/29/24 0405 01/30/24 0453  Weight: 57.7 kg 57.5 kg 56.7 kg     Physical examination:    Physical Exam  Gen:- Awake Alert, in no acute distress , some conversational dyspnea HEENT:- [place].AT, No sclera icterus Neck-Supple Neck,No JVD,.  Lungs-  No wheezing , fair air movement bilaterally  CV- S1, S2 normal, RRR Abd-  +ve B.Sounds, Abd Soft, No tenderness,    Extremity/Skin:- +ve  edema,   good pedal pulses  Psych-affect is appropriate, oriented x3 Neuro-generalized weakness, no new focal deficits, no tremors   LABs:     Latest Ref Rng & Units 01/23/2024    3:26 AM 01/22/2024    1:22 AM 07/27/2023  9:15 AM  CBC  WBC 4.0 - 10.5 K/uL 5.9  5.1  7.4   Hemoglobin 12.0 - 15.0 g/dL 84.1  9.4  9.2   Hematocrit 36.0 - 46.0 % 29.0  26.1  28.2   Platelets 150 - 400 K/uL 245  192  256       Latest Ref Rng & Units 01/31/2024    4:46 AM 01/30/2024    5:10 AM 01/29/2024    4:21 AM  CMP  Glucose 70 - 99 mg/dL 68  324  401   BUN 8 - 23 mg/dL 93  88  82   Creatinine 0.44 - 1.00 mg/dL 0.27  2.53  6.64   Sodium 135 - 145 mmol/L 132  131  131   Potassium 3.5 - 5.1 mmol/L 4.1  4.4  4.6   Chloride 98 - 111 mmol/L 94  92  93   CO2 22 - 32 mmol/L 29  26  26    Calcium  8.9 - 10.3 mg/dL 8.9  9.0  9.3    Micro Results Recent Results (from the past 240 hours)  MRSA Next Gen by PCR, Nasal     Status: None   Collection Time: 01/22/24  6:40 AM   Specimen: Nasal Mucosa; Nasal Swab  Result Value Ref Range Status   MRSA by PCR Next Gen NOT DETECTED NOT DETECTED Final    Comment: (NOTE) The GeneXpert MRSA Assay (FDA approved for NASAL specimens only), is one component  of a comprehensive MRSA colonization surveillance program. It is not intended to diagnose MRSA infection nor to guide or monitor treatment for MRSA infections. Test performance is not FDA approved in patients less than 71 years old. Performed at [facility], [address] [place], [place] [address]   Resp panel by RT-PCR (RSV, Flu A&B, Covid) Anterior Nasal Swab     Status: None   Collection Time: 01/22/24 12:53 PM   Specimen: Anterior Nasal Swab  Result Value Ref Range Status   SARS Coronavirus 2 by RT PCR NEGATIVE NEGATIVE Final    Comment: (NOTE) SARS-CoV-2 target nucleic acids are NOT DETECTED.  The SARS-CoV-2 RNA is generally detectable in upper respiratory specimens during the acute phase of infection. The lowest concentration of SARS-CoV-2 viral copies this assay can detect is 138 copies/mL. A negative result does not preclude SARS-Cov-2 infection and should not be used as the sole basis for treatment or other patient management decisions. A negative result may occur with  improper specimen collection/handling, submission of specimen other than nasopharyngeal swab, presence of viral mutation(s) within the areas targeted by this assay, and inadequate number of viral copies(<138 copies/mL). A negative result must be combined with clinical observations, patient history, and epidemiological information. The expected result is Negative.  Fact Sheet for Patients:  BloggerCourse.com  Fact Sheet for Healthcare Providers:  SeriousBroker.it  This test is no t yet approved or cleared by the [place]  FDA and  has been authorized for detection and/or diagnosis of SARS-CoV-2 by FDA under an Emergency Use Authorization (EUA). This EUA will remain  in effect (meaning this test can be used) for the duration of the COVID-19 declaration under Section 564(b)(1) of the Act, 21 U.S.C.section 360bbb-3(b)(1), unless the authorization is  terminated  or revoked sooner.       Influenza A by PCR NEGATIVE NEGATIVE Final   Influenza B by PCR NEGATIVE NEGATIVE Final    Comment: (NOTE) The Xpert Xpress SARS-CoV-2/FLU/RSV plus assay is intended as an aid in the  diagnosis of influenza from Nasopharyngeal swab specimens and should not be used as a sole basis for treatment. Nasal washings and aspirates are unacceptable for Xpert Xpress SARS-CoV-2/FLU/RSV testing.  Fact Sheet for Patients: BloggerCourse.com  Fact Sheet for Healthcare Providers: SeriousBroker.it  This test is not yet approved or cleared by the [place]  FDA and has been authorized for detection and/or diagnosis of SARS-CoV-2 by FDA under an Emergency Use Authorization (EUA). This EUA will remain in effect (meaning this test can be used) for the duration of the COVID-19 declaration under Section 564(b)(1) of the Act, 21 U.S.C. section 360bbb-3(b)(1), unless the authorization is terminated or revoked.     Resp Syncytial Virus by PCR NEGATIVE NEGATIVE Final    Comment: (NOTE) Fact Sheet for Patients: BloggerCourse.com  Fact Sheet for Healthcare Providers: SeriousBroker.it  This test is not yet approved or cleared by the [place]  FDA and has been authorized for detection and/or diagnosis of SARS-CoV-2 by FDA under an Emergency Use Authorization (EUA). This EUA will remain in effect (meaning this test can be used) for the duration of the COVID-19 declaration under Section 564(b)(1) of the Act, 21 U.S.C. section 360bbb-3(b)(1), unless the authorization is terminated or revoked.  Performed at [facility], [address], [place], [place] [address]    SIGNED: Colin Dawley, MD,   Arlin Benes - [facility] hospitalist   If 7PM-7AM, please contact night-coverage [email or web address], 01/31/2024, 6:50 PM

## 2024-01-31 NOTE — Progress Notes (Signed)
 Mobility Specialist Progress Note:    01/31/24 1118  Mobility  Activity Ambulated with assistance in room;Transferred to/from [facility]  Level of Assistance Minimal assist, patient does 75% or more  Assistive Device Front wheel walker  Distance Ambulated (ft) 20 ft  Range of Motion/Exercises Active;All extremities  Activity Response Tolerated well  Mobility Referral Yes  Mobility visit 1 Mobility  Mobility Specialist Start Time (ACUTE ONLY) 1050  Mobility Specialist Stop Time (ACUTE ONLY) 1115  Mobility Specialist Time Calculation (min) (ACUTE ONLY) 25 min   Pt received in chair, agreeable to mobility. Required MinA to stand and ambulate with RW. Tolerated well, asx throughout. Returned to chair, all needs met.  Glinda Lapping Mobility Specialist Please contact via Special educational needs teacher or  Rehab office at [phone]

## 2024-01-31 NOTE — Care Management Important Message (Signed)
 Important Message  Patient Details  Name: Emily Jennings MRN: [id number] Date of Birth: [birth year]   Important Message Given:  Yes - Medicare IM     Laritza Vokes L Corynne Scibilia 01/31/2024, 10:08 AM

## 2024-01-31 NOTE — Inpatient Diabetes Management (Addendum)
 Inpatient Diabetes Program Recommendations  AACE/ADA: New Consensus Statement on Inpatient Glycemic Control (2015)  Target Ranges:  Prepandial:   less than 140 mg/dL      Peak postprandial:   less than 180 mg/dL (1-2 hours)      Critically ill patients:  140 - 180 mg/dL    Latest Reference Range & Units 01/30/24 07:33 01/30/24 11:06 01/30/24 16:41 01/30/24 20:20 01/30/24 21:21  Glucose-Capillary 70 - 99 mg/dL 161 (H)  4 units Novolog   20 units 70/30 Insulin  286 (H)  3 units Novolog   94     10 units 70/30 Insulin  68 (L) 84    Latest Reference Range & Units 01/31/24 03:17 01/31/24 03:23 01/31/24 04:06 01/31/24 06:07 01/31/24 07:28 01/31/24 10:03 01/31/24 12:01  Glucose-Capillary 70 - 99 mg/dL 24 (LL) 27 (LL) 096 (H) 78 111 (H)  70/30 Insulin  Held 250 (H)  2 units Novolog  @1148  360 (H)  (LL): Data is critically low (H): Data is abnormally high    Home DM Meds: Humalog  75/25 Insulin  10 units BID Dexcom G7 CGM    Current Orders: 70/30 Insulin  10 units BID Novolog  0-6 units TID    --Note Hypoglycemia yest afternoon --70/30 Insulin  doses reduced to 10 units BID --Severe Hypoglycemia at 3am today --70/30 Insulin  was HELD this AM--CBG up to 360 at 12pm due to food intake and no insulin  on board   MD- Recommend Recheck CBG now Give conservative dose of Novolog  if CBG elevated (1 unit Novolog  for every 50 mg/dl > CBG 045)  Pt told the Diabetes RN on 05/07 that she would like to be switched back to Tresiba  and Humalog  Insulin  that she was on prior (Her ENDO Dr. Monte Antonio changed her insulin  to Humalog  75/25 insulin  10 units BID on 12/05/2023)  Recommend we Stop the 70/30 Insulin  and restart Basal/Bolus regimen: Start Semglee  6 units daily (was taking Tresiba  insulin  6 units daily prior to March) Continue Novolog  0-6 units TID Start Novolog  3 units TID with meals HOLD if pt NPO HOLD if pt eats <50% meals  If pt still allowed to d/c home today and desires to resume  previous insulin  regimen, please give her Rxs for: Tresiba  pens (#[id number]) Humalog  pens (#[id number]) Insulin  pen needles (#[id number])     --Will follow patient during hospitalization--  Langston Pippins RN, MSN, CDCES Diabetes Coordinator Inpatient Glycemic Control Team Team Pager: [phone] (8a-5p)

## 2024-01-31 NOTE — Plan of Care (Signed)
   Problem: Education: Goal: Knowledge of General Education information will improve Description: Including pain rating scale, medication(s)/side effects and non-pharmacologic comfort measures Outcome: Progressing   Problem: Health Behavior/Discharge Planning: Goal: Ability to manage health-related needs will improve Outcome: Progressing   Problem: Clinical Measurements: Goal: Diagnostic test results will improve Outcome: Progressing

## 2024-01-31 NOTE — Progress Notes (Signed)
 Blood glucose was 27.  Received dextrose  IV and rose to 101.  Contacted Dr. Adefeso for changing accuchecks to Q2 hours.

## 2024-02-01 DIAGNOSIS — T68XXXA Hypothermia, initial encounter: Secondary | ICD-10-CM | POA: Diagnosis not present

## 2024-02-01 DIAGNOSIS — I10 Essential (primary) hypertension: Secondary | ICD-10-CM | POA: Diagnosis not present

## 2024-02-01 DIAGNOSIS — E876 Hypokalemia: Secondary | ICD-10-CM | POA: Diagnosis not present

## 2024-02-01 DIAGNOSIS — E1165 Type 2 diabetes mellitus with hyperglycemia: Secondary | ICD-10-CM | POA: Diagnosis not present

## 2024-02-01 LAB — GLUCOSE, CAPILLARY
Glucose-Capillary: 174 mg/dL — ABNORMAL HIGH (ref 70–99)
Glucose-Capillary: 243 mg/dL — ABNORMAL HIGH (ref 70–99)
Glucose-Capillary: 325 mg/dL — ABNORMAL HIGH (ref 70–99)
Glucose-Capillary: 328 mg/dL — ABNORMAL HIGH (ref 70–99)
Glucose-Capillary: 59 mg/dL — ABNORMAL LOW (ref 70–99)
Glucose-Capillary: 71 mg/dL (ref 70–99)
Glucose-Capillary: 89 mg/dL (ref 70–99)

## 2024-02-01 MED ORDER — TORSEMIDE 20 MG PO TABS: 60.0000 mg | ORAL_TABLET | Freq: Two times a day (BID) | ORAL | Status: DC

## 2024-02-01 MED ORDER — HYDRALAZINE HCL 100 MG PO TABS: 100.0000 mg | ORAL_TABLET | Freq: Three times a day (TID) | ORAL | 1 refills | Status: DC

## 2024-02-01 MED ORDER — TORSEMIDE 60 MG PO TABS: 60.0000 mg | ORAL_TABLET | Freq: Two times a day (BID) | ORAL | 5 refills | Status: DC

## 2024-02-01 MED ORDER — CLONIDINE HCL 0.3 MG PO TABS: 0.3000 mg | ORAL_TABLET | Freq: Two times a day (BID) | ORAL | 5 refills | Status: DC

## 2024-02-01 MED ORDER — CARVEDILOL 12.5 MG PO TABS: 12.5000 mg | ORAL_TABLET | Freq: Two times a day (BID) | ORAL | 5 refills | Status: DC

## 2024-02-01 MED ORDER — ISOSORBIDE MONONITRATE ER 60 MG PO TB24: 60.0000 mg | ORAL_TABLET | Freq: Every day | ORAL | 5 refills | Status: DC

## 2024-02-01 MED ORDER — POTASSIUM CHLORIDE ER 10 MEQ PO TBCR: 10.0000 meq | EXTENDED_RELEASE_TABLET | Freq: Every day | ORAL | 2 refills | Status: DC

## 2024-02-01 MED ORDER — INSULIN LISPRO (1 UNIT DIAL) 100 UNIT/ML (KWIKPEN): 0.0000 [IU] | PEN_INJECTOR | Freq: Three times a day (TID) | SUBCUTANEOUS | 11 refills | Status: DC

## 2024-02-01 MED ORDER — ALBUTEROL SULFATE HFA 108 (90 BASE) MCG/ACT IN AERS: 2.0000 | INHALATION_SPRAY | RESPIRATORY_TRACT | 1 refills | Status: DC | PRN

## 2024-02-01 MED ORDER — AMLODIPINE BESYLATE 10 MG PO TABS: 10.0000 mg | ORAL_TABLET | Freq: Every day | ORAL | 5 refills | Status: DC

## 2024-02-01 MED ORDER — TRESIBA FLEXTOUCH 100 UNIT/ML ~~LOC~~ SOPN: 6.0000 [IU] | PEN_INJECTOR | Freq: Every day | SUBCUTANEOUS | 2 refills | Status: DC

## 2024-02-01 MED ORDER — MAGNESIUM OXIDE -MG SUPPLEMENT 400 (240 MG) MG PO TABS: 400.0000 mg | ORAL_TABLET | Freq: Every day | ORAL | 1 refills | Status: DC

## 2024-02-01 NOTE — Progress Notes (Signed)
 [facility] Progress Note    Assessment/ Plan:   # Acute kidney injury on CKD stage IV with chronic proteinuria: Baseline creatinine around 2.3-2.9.  Rising creatinine likely associated with some optimization of blood pressure and volume status.  Some degree of cardiorenal syndrome as well as progression of her CKD.  Followed by Dr. Carrolyn Clan as outpatient. No obstruction on u/s.     # Hypertensive urgency: BP somewhat better Multiple antihypertensives - hold spironolactone  - increase torsemide to 60 mg BID - labetalol  changed to carvedilol  with good effect - continue to monitor - could consider adding back spironolactone  when at a steady state but hesitant to do this right now  # Type II DM with hyperglycemia and hypoglycemia: Adjustment of insulin  by primary team.   # Acute on chronic diastolic CHF with fluid overload: Echo with preserved EF however has grade 2 diastolic dysfunction.   - CXR 01/31/24 with mild CHF and emphysema, got IV Lasix    # False hyponatremia: corrects to normal when accounting for glucose  #Hypokalemia:better  Subjective:    Eating breakfast this AM.  Says that her SOB comes and goes.  BP is trending better.  No labs today but stable when checked yesterday.  CXR yesterday with mild CHF and emphysema, got 80 IV Lasix .      Objective:   BP (!) 166/69 (BP Location: Right Arm)   Pulse (!) 57   Temp 97.6 F (36.4 C) (Oral)   Resp 14   Ht 5' (1.524 m)   Wt 57.3 kg   SpO2 99%   BMI 24.67 kg/m   Intake/Output Summary (Last 24 hours) at 02/01/2024 0920 Last data filed at 01/31/2024 1300 Gross per 24 hour  Intake --  Output 475 ml  Net -475 ml    Weight change:   Physical Exam: Gen: NAD, sitting up in bed CVS: Normal rate, no rub Resp: clear Abd: soft Ext: trace pitting edema b/l Les, warm and well-perfused Neuro: awake, alert, hard of hearing  Imaging: DG CHEST PORT 1 VIEW Result Date: 01/31/2024 CLINICAL DATA:  [id number].   Dyspnea and respiratory abnormalities. EXAM: PORTABLE CHEST 1 VIEW COMPARISON:  Portable chest 01/22/2024 FINDINGS: The lungs are emphysematous with mild chronic interstitial change. Stable moderate cardiomegaly. Interval increased central vessel prominence, increased central interstitial consolidation suggesting early interstitial edema. Trace pleural effusions. Findings consistent with a mild CHF pattern. No focal pneumonia is seen. Stable mediastinum. The aorta is tortuous and calcified. Osteopenia and thoracic spondylosis. IMPRESSION: 1. Mild CHF pattern.  Trace pleural effusions. 2. Emphysema and chronic interstitial change. 3. Aortic atherosclerosis. Electronically Signed   By: Denman Fischer M.D.   On: 01/31/2024 05:35    Labs: BMET Recent Labs  Lab 01/26/24 0416 01/27/24 0413 01/27/24 2206 01/28/24 0348 01/29/24 0421 01/30/24 0510 01/31/24 0446  NA 130* 131*  --  132* 131* 131* 132*  K 3.9 3.5  --  2.9* 4.6 4.4 4.1  CL 94* 93*  --  92* 93* 92* 94*  CO2 27 27  --  29 26 26 29   GLUCOSE 282* 196* 98 115* 290* 219* 68*  BUN 66* 72*  --  72* 82* 88* 93*  CREATININE 2.73* 3.23*  --  3.17* 3.60* 3.58* 3.50*  CALCIUM  8.6* 8.9  --  9.1 9.3 9.0 8.9  PHOS  --   --   --   --   --   --  5.5*   CBC No results for input(s): "WBC", "NEUTROABS", "HGB", "  HCT", "MCV", "PLT" in the last 168 hours.   Medications:     amLODipine   10 mg Oral Daily   carvedilol   12.5 mg Oral BID WC   cloNIDine   0.3 mg Oral TID   heparin   5,000 Units Subcutaneous Q8H   hydrALAZINE   100 mg Oral TID   insulin  aspart  0-6 Units Subcutaneous TID WC   insulin  glargine-yfgn  6 Units Subcutaneous Daily   isosorbide  mononitrate  60 mg Oral Daily   magnesium  oxide  400 mg Oral Daily   polyethylene glycol  17 g Oral Daily   rosuvastatin   5 mg Oral Daily   sennosides  15 mL Oral BID   spironolactone   25 mg Oral Daily   torsemide  40 mg Oral BID     [facility] 02/01/2024, 9:20 AM

## 2024-02-01 NOTE — Progress Notes (Signed)
 Iv removed and DC instructions reviewed with patient and husband. Husband familiar with how to use Kwik pen as that is what they previously had at home.  New meds sent to pharmacy.  Husband to drive home.

## 2024-02-01 NOTE — Discharge Instructions (Signed)
 1)Very Low-salt diet advised---Less than 2 gm of Sodium per day advised----ok to use Mrs DASH salt substitute instead of Salt 2)Weigh yourself daily, call if you gain more than 3 pounds in 1 day or more than 5 pounds in 1 week as your diuretic medications may need to be adjusted 3)Short acting Humalog  insulin  per sliding scale 0-12 ---:-- Insulin  injection 0-12 Units 0-12 Units Subcutaneous, 3 times daily with meals CBG 70 - 120: 0 unit CBG 121 - 150: 0 unit  CBG 151 - 200: 2 unit CBG 201 - 250: 4 units CBG 251 - 300: 6 units CBG 301 - 350: 8 units  CBG 351 - 400: 10 units  CBG > 400: 12 units  4) stop Lasix /furosemide , start torsemide/Demadex instead for fluid 5) stop nifedipine  (Adalat ), start isosorbide  instead-for blood pressure 6) stop 75/25 insulin --- use Tresiba  insulin  6 units daily instead 7) follow-up with primary care physician within a week for recheck and reevaluation

## 2024-02-01 NOTE — Plan of Care (Signed)
   Problem: Education: Goal: Knowledge of General Education information will improve Description: Including pain rating scale, medication(s)/side effects and non-pharmacologic comfort measures Outcome: Progressing   Problem: Health Behavior/Discharge Planning: Goal: Ability to manage health-related needs will improve Outcome: Progressing   Problem: Clinical Measurements: Goal: Ability to maintain clinical measurements within normal limits will improve Outcome: Progressing Goal: Will remain free from infection Outcome: Progressing Goal: Diagnostic test results will improve Outcome: Progressing Goal: Respiratory complications will improve Outcome: Progressing Goal: Cardiovascular complication will be avoided Outcome: Progressing   Problem: Activity: Goal: Risk for activity intolerance will decrease Outcome: Progressing   Problem: Nutrition: Goal: Adequate nutrition will be maintained Outcome: Progressing   Problem: Coping: Goal: Level of anxiety will decrease Outcome: Progressing   Problem: Elimination: Goal: Will not experience complications related to bowel motility Outcome: Progressing Goal: Will not experience complications related to urinary retention Outcome: Progressing   Problem: Pain Managment: Goal: General experience of comfort will improve and/or be controlled Outcome: Progressing   Problem: Safety: Goal: Ability to remain free from injury will improve Outcome: Progressing   Problem: Skin Integrity: Goal: Risk for impaired skin integrity will decrease Outcome: Progressing   Problem: Education: Goal: Ability to describe self-care measures that may prevent or decrease complications (Diabetes Survival Skills Education) will improve Outcome: Progressing Goal: Individualized Educational Video(s) Outcome: Progressing   Problem: Coping: Goal: Ability to adjust to condition or change in health will improve Outcome: Progressing   Problem: Fluid  Volume: Goal: Ability to maintain a balanced intake and output will improve Outcome: Progressing   Problem: Health Behavior/Discharge Planning: Goal: Ability to identify and utilize available resources and services will improve Outcome: Progressing Goal: Ability to manage health-related needs will improve Outcome: Progressing   Problem: Metabolic: Goal: Ability to maintain appropriate glucose levels will improve Outcome: Progressing   Problem: Nutritional: Goal: Maintenance of adequate nutrition will improve Outcome: Progressing Goal: Progress toward achieving an optimal weight will improve Outcome: Progressing   Problem: Skin Integrity: Goal: Risk for impaired skin integrity will decrease Outcome: Progressing   Problem: Tissue Perfusion: Goal: Adequacy of tissue perfusion will improve Outcome: Progressing   Problem: Education: Goal: Ability to describe self-care measures that may prevent or decrease complications (Diabetes Survival Skills Education) will improve Outcome: Progressing Goal: Individualized Educational Video(s) Outcome: Progressing   Problem: Coping: Goal: Ability to adjust to condition or change in health will improve Outcome: Progressing   Problem: Fluid Volume: Goal: Ability to maintain a balanced intake and output will improve Outcome: Progressing   Problem: Health Behavior/Discharge Planning: Goal: Ability to identify and utilize available resources and services will improve Outcome: Progressing Goal: Ability to manage health-related needs will improve Outcome: Progressing   Problem: Metabolic: Goal: Ability to maintain appropriate glucose levels will improve Outcome: Progressing   Problem: Nutritional: Goal: Maintenance of adequate nutrition will improve Outcome: Progressing Goal: Progress toward achieving an optimal weight will improve Outcome: Progressing   Problem: Skin Integrity: Goal: Risk for impaired skin integrity will  decrease Outcome: Progressing   Problem: Tissue Perfusion: Goal: Adequacy of tissue perfusion will improve Outcome: Progressing

## 2024-02-01 NOTE — TOC Transition Note (Addendum)
 Transition of Care [facility]) - Discharge Note   Patient Details  Name: Emily Jennings MRN: [id number] Date of Birth: 1933-10-26  Transition of Care [facility]) CM/SW Contact:  Cyndie Dredge, LCSWA Phone Number: 02/01/2024, 10:01 AM   Clinical Narrative:     Patient is scheduled to DC today. Cory with [facility] was updated on patient DC today. Unable to reach spouse on home phone. TOC signing off.  Final next level of care: Home w Home Health Services Barriers to Discharge: Barriers Resolved   Patient Goals and CMS Choice Patient states their goals for this hospitalization and ongoing recovery are:: DC back home CMS Medicare.gov Compare Post Acute Care list provided to:: Patient Choice offered to / list presented to : Patient [place] ownership interest in [facility].provided to::  (n/a)    Discharge Placement                Patient to be transferred to facility by: family Name of family member notified: Attempted to call spouse / Audry-patient Patient and family notified of of transfer: 02/01/24  Discharge Plan and Services Additional resources added to the After Visit Summary for   In-house Referral: Clinical Social Work   Post Acute Care Choice: Home Health          DME Arranged: N/A DME Agency: [employer]       HH Arranged: PT, OT HH Agency: [facility] Date [facility] Agency Contacted: 02/01/24 Time HH Agency Contacted: 1000 Representative spoke with at [facility] Agency: Randel Buss  Social Drivers of Health (SDOH) Interventions SDOH Screenings   Food Insecurity: No Food Insecurity (01/22/2024)  Housing: Low Risk  (01/22/2024)  Transportation Needs: No Transportation Needs (01/22/2024)  Utilities: Not At Risk (01/22/2024)  Alcohol Screen: Low Risk  (11/15/2022)  Depression (PHQ2-9): Low Risk  (10/24/2023)  Financial Resource Strain: Low Risk  (12/28/2022)  Physical Activity: Sufficiently Active (11/15/2022)  Social Connections: Socially Integrated (01/22/2024)  Stress: No  Stress Concern Present (12/28/2022)  Tobacco Use: Low Risk  (01/29/2024)     Readmission Risk Interventions    02/01/2024    9:55 AM 01/30/2024   12:15 PM 01/23/2024    8:19 AM  Readmission Risk Prevention Plan  Transportation Screening Complete Complete Complete  HRI or Home Care Consult Complete Complete Complete  Social Work Consult for Recovery Care Planning/Counseling Complete Complete Complete  Palliative Care Screening Complete Complete Not Applicable  Medication Review Oceanographer) Complete Complete Complete

## 2024-02-01 NOTE — Discharge Summary (Signed)
 Emily Jennings, is a 89 y.o. female  DOB June 29, 1934  MRN [id number].  Admission date:  01/22/2024  Admitting Physician  Twilla Galea, DO  Discharge Date:  02/01/2024   Primary MD  Towanda Fret, MD  Recommendations for primary care physician for things to follow:  1)Very Low-salt diet advised---Less than 2 gm of Sodium per day advised----ok to use Mrs DASH salt substitute instead of Salt 2)Weigh yourself daily, call if you gain more than 3 pounds in 1 day or more than 5 pounds in 1 week as your diuretic medications may need to be adjusted 3)Short acting Humalog  insulin  per sliding scale 0-12 ---:-- Insulin  injection 0-12 Units 0-12 Units Subcutaneous, 3 times daily with meals CBG 70 - 120: 0 unit CBG 121 - 150: 0 unit  CBG 151 - 200: 2 unit CBG 201 - 250: 4 units CBG 251 - 300: 6 units CBG 301 - 350: 8 units  CBG 351 - 400: 10 units  CBG > 400: 12 units  4) stop Lasix /furosemide , start torsemide /Demadex  instead for fluid 5) stop nifedipine  (Adalat ), start isosorbide  instead-for blood pressure 6) stop 75/25 insulin --- use Tresiba  insulin  6 units daily instead 7) follow-up with primary care physician within a week for recheck and reevaluation  Admission Diagnosis  Hyperglycemia [R73.9] Type 2 diabetes mellitus with hyperglycemia (HCC) [E11.65] AKI (acute kidney injury) (HCC) [N17.9]   Discharge Diagnosis  Hyperglycemia [R73.9] Type 2 diabetes mellitus with hyperglycemia (HCC) [E11.65] AKI (acute kidney injury) (HCC) [N17.9]    Principal Problem:   Type 2 diabetes mellitus with hyperglycemia (HCC) Active Problems:   Acute kidney injury superimposed on chronic kidney disease (HCC)   Hyponatremia   Hypothermia   Hypokalemia   Mixed hyperlipidemia   Essential hypertension      Past Medical History:  Diagnosis Date   Allergy    Anxiety    ANXIETY DISORDER, GENERALIZED 07/15/2007   Qualifier:  Diagnosis of  By: Michaell Adolph     Arthritis    Cancer [facility]) 2009   breast, carcinoma in situ left   Carcinoma in situ of breast 05/21/2008   Qualifier: Diagnosis of  By: Rodolph Clap MD, Margaret  Diagnosed in 2009, completed 5 year course of tamoxifen, no evidence of recurrence    Carotid stenosis    11/16/2005  mild plaque formation and stenosis proximal right ECA   Cataract    Complication of anesthesia    Coronary artery disease    cardiac catheterization on 03/20/2006  LAD mid 40% stenosis, left circumflex mild 40% stenosis, RCA mid-vessel 40% to 50% lesion   EF 60%   Diabetes mellitus    GERD (gastroesophageal reflux disease)    Hernia, inguinal    left   Hyperglycemia    Hypertension    Insomnia 11/16/2011   Low blood potassium    Non-insulin  dependent type 2 diabetes mellitus (HCC)    Osteoporosis    Shortness of breath    2D Echocardiogram 01/26/2009   EF of greater than 55%, mild MR, mild TR,  normal ventricular function   Thickened endometrium 10/26/2017   Noted by gyne in 2017, missed 6 month follow up, referred in 09/2017   Ventricular tachycardia, non-sustained (HCC)    developed during stress test 02/08/2006, spontaneously aborted, mild reversible apical defect    Past Surgical History:  Procedure Laterality Date   BOWEL RESECTION Left 07/19/2023   Procedure: SMALL BOWEL RESECTION;  Surgeon: Awilda Bogus, MD;  Location: [facility];  Service: General;  Laterality: Left;   BREAST LUMPECTOMY Left 2009   Left breast 2009   CATARACT EXTRACTION W/PHACO Left 10/28/2014   Procedure: PHACO EMULSION CATARACT EXTRACTION WITH INTRAOCULAR LENS IMPLANT LEFT EYE (IOC);  Surgeon: Ben Bracken, MD;  Location: [facility] OR;  Service: Ophthalmology;  Laterality: Left;   COLONOSCOPY     cyst removed from left foot     INGUINAL HERNIA REPAIR Left 07/19/2023   Procedure: HERNIA REPAIR FEMORAL INCARCERATED;  Surgeon: Awilda Bogus, MD;  Location: [facility];  Service: General;  Laterality: Left;    REFRACTIVE SURGERY Left     HPI  from the history and physical done on the day of admission:    HPI: JACKILYN UMPHLETT is an 89 y.o. female with medical history significant of hypertension, T2DM, CHF, CAD, CKD stage IV who presents to the emergency department due to elevated blood sugar within last 2 to 3 days. She states that she has been compliant with her insulin  as prescribed.  Patient denies chest pain, shortness of breath.  She endorsed right hand swelling.   ED Course:  In the emergency department, BP was 152/117, temperature 94.74F.  CBC showed normocytic anemia, BMP showed sodium of 123, potassium 3.0, chloride 84, bicarb 26, blood glucose 356, BUN 78, creatinine 3.70, GFR 11.  Urinalysis was positive for glycosuria, magnesium  2.3 Patient was treated with 10 units of NovoLog , IV hydration was provided.   Review of Systems: Review of systems as noted in the HPI. All other systems reviewed and are negative.     Hospital Course:    1)Very Low-salt diet advised---Less than 2 gm of Sodium per day advised----ok to use Mrs DASH salt substitute instead of Salt 2)Weigh yourself daily, call if you gain more than 3 pounds in 1 day or more than 5 pounds in 1 week as your diuretic medications may need to be adjusted 3)Short acting Humalog  insulin  per sliding scale 0-12 ---:-- Insulin  injection 0-12 Units 0-12 Units Subcutaneous, 3 times daily with meals CBG 70 - 120: 0 unit CBG 121 - 150: 0 unit  CBG 151 - 200: 2 unit CBG 201 - 250: 4 units CBG 251 - 300: 6 units CBG 301 - 350: 8 units  CBG 351 - 400: 10 units  CBG > 400: 12 units  4) stop Lasix /furosemide , start torsemide /Demadex  instead for fluid 5) stop nifedipine  (Adalat ), start isosorbide  instead-for blood pressure 6) stop 75/25 insulin --- use Tresiba  insulin  6 units daily instead 7) follow-up with primary care physician within a week for recheck and reevaluation   1)Accelerated hypertension w h/o hypertension --BP control  improved -Medications adjusted as follows--amlodipine  10 mg daily (in place of nifedipine ), clonidine  0.3 mg bid, hydralazine  100 mg 3 times daily along with isosorbide  60 daily, torsemide  40 twice daily (in place of Lasix ),  Coreg  at 12.5 mg twice daily,  -See discharge med rec and discharge instructions - 2)HFpEF--chronic diastolic dysfunction CHF  - Echo from 05/08/2023 with EF of 65 to 70% with grade 2 diastolic dysfunction  --Per nephrologist okay  to continue torsemide  40 twice daily   - Low-salt diet advised   3)AKI----acute kidney injury on CKD stage -IV - baseline creatinine = creatinine was 2.31 on 11/29/2023   , creatinine was 2.93 on 01/25/2024 -Treated with IV Lasix  - creatinine is now= currently stable around 3.5 for the last 3 days after bumping up with diuresis, - Discharged on torsemide  outpatient follow-up for repeat BMP advised -Nephrology consult appreciated --renally adjust medications, avoid nephrotoxic agents / dehydration  / hypotension   4)Type 2 diabetes mellitus with hyperglycemia - Brittle diabetes, with extreme episode of hyper/hypoglycemia-this is exacerbated by progressive renal failure --Blood glucose  erratic with episodes of hypoglycemia alternating with episodes of hypoglycemia----?? some degree of insulin  stacking with worsening renal function -- After further conversations with diabetic educator will discontinue 75/25 insulin  and  discharge on Tresiba  instead- --Use Novolog /Humalog  Sliding scale insulin  with Accu-Cheks/Fingersticks as ordered    Hypothermia -resolved-  TSH normal at 2.6, improved glycemic control   -Mild persistent hyponatremia--stable, with sodium around 132  -- Advised repeat BMP as outpatient within a week given diuretic use  Chronic anemia-----suspect related to CKD -Hgb stable above 9 -Defer to nephrology service with regards to timing and dose of ESA agent/Procrit    Mixed hyperlipidemia Continue Crestor    Social/Ethics--plan  of care and advance directive discussed with patient and husband at bedside,  - Blood pressure is difficult to manage - Blood sugars are erratic - Renal function is worsened - Congestive heart failure symptoms difficult to manage with diuretics and fluid restriction -she remains a full code, overall prognosis is not great given significant comorbid conditions and advanced age  Discharge Condition: Stable, discharging her home with home health PT, as well as home health RN for medication compliance--- due to multiple medication changes as well as for cardiopulmonary assessment  Follow UP   Follow-up Information     [facility], [facility] Follow up.   Specialty: Home Health Services Why: [facility] staff will call you to schedule in home Physical Therapy visits Contact information: [address] [place] [address] [phone]         Towanda Fret, MD. Schedule an appointment as soon as possible for a visit in 1 week(s).   Specialty: Family Medicine Contact information: [address] [place] [address] [phone]                  Consults obtained -nephrology  Diet and Activity recommendation:  As advised  Discharge Instructions     Discharge Instructions     Call MD for:  difficulty breathing, headache or visual disturbances   Complete by: As directed    Call MD for:  persistant dizziness or light-headedness   Complete by: As directed    Call MD for:  persistant nausea and vomiting   Complete by: As directed    Call MD for:  temperature >100.4   Complete by: As directed    Diet - low sodium heart healthy   Complete by: As directed    Diet Carb Modified   Complete by: As directed    Discharge instructions   Complete by: As directed    1)Very Low-salt diet advised---Less than 2 gm of Sodium per day advised----ok to use Mrs DASH salt substitute instead of Salt 2)Weigh yourself daily, call if you gain more  than 3 pounds in 1 day or more than 5 pounds in 1 week as your diuretic medications  may need to be adjusted 3)Short acting Humalog  insulin  per sliding scale 0-12 ---:-- Insulin  injection 0-12 Units 0-12 Units Subcutaneous, 3 times daily with meals CBG 70 - 120: 0 unit CBG 121 - 150: 0 unit  CBG 151 - 200: 2 unit CBG 201 - 250: 4 units CBG 251 - 300: 6 units CBG 301 - 350: 8 units  CBG 351 - 400: 10 units  CBG > 400: 12 units  4) stop Lasix /furosemide , start torsemide /Demadex  instead for fluid 5) stop nifedipine  (Adalat ), start isosorbide  instead-for blood pressure 6) stop 75/25 insulin --- use Tresiba  insulin  6 units daily instead 7) follow-up with primary care physician within a week for recheck and reevaluation   Increase activity slowly   Complete by: As directed         Discharge Medications     Allergies as of 02/01/2024       Reactions   Farxiga [dapagliflozin] Other (See Comments)   DKA   Metformin  And Related Diarrhea   Lost appetite and weight    Benadryl [diphenhydramine Hcl] Hypertension   Citalopram Other (See Comments)   Unknown    Tramadol  Other (See Comments)   Felt light headed and dizzy   Amlodipine  Swelling   Crestor  [rosuvastatin ] Swelling, Other (See Comments)   "Feet swelling" Makes her feel weak. Pt takes 5mg  at home        Medication List     STOP taking these medications    furosemide  40 MG tablet Commonly known as: Lasix    Insulin  Lispro Prot & Lispro (75-25) 100 UNIT/ML Kwikpen Commonly known as: HumaLOG  Mix 75/25 KwikPen   NIFEdipine  60 MG 24 hr tablet Commonly known as: ADALAT  CC   potassium chloride  SA 20 MEQ tablet Commonly known as: KLOR-CON  M       TAKE these medications    albuterol  108 (90 Base) MCG/ACT inhaler Commonly known as: VENTOLIN  HFA Inhale 2 puffs into the lungs every 2 (two) hours as needed for wheezing or shortness of breath.   amLODipine  10 MG tablet Commonly known as: NORVASC  Take 1 tablet (10 mg total) by  mouth daily. Start taking on: Feb 02, 2024   carvedilol  12.5 MG tablet Commonly known as: COREG  Take 1 tablet (12.5 mg total) by mouth 2 (two) times daily with a meal.   cloNIDine  0.3 MG tablet Commonly known as: CATAPRES  Take 1 tablet (0.3 mg total) by mouth 2 (two) times daily.   Dexcom G7 Sensor Misc Patient is to change the sensor every 10 days to monitor her blood sugars as directed by the provider.   hydrALAZINE  100 MG tablet Commonly known as: APRESOLINE  Take 1 tablet (100 mg total) by mouth 3 (three) times daily.   insulin  lispro 100 UNIT/ML KwikPen Commonly known as: HumaLOG  KwikPen Inject 0-12 Units into the skin 3 (three) times daily. Short acting Humalog  insulin  per sliding scale 0-12 ---:-- Insulin  injection 0-12 Units 0-12 Units Subcutaneous, 3 times daily with meals CBG 70 - 120: 0 unit CBG 121 - 150: 0 unit  CBG 151 - 200: 2 unit CBG 201 - 250: 4 units CBG 251 - 300: 6 units CBG 301 - 350: 8 units  CBG 351 - 400: 10 units  CBG > 400: 12 units   isosorbide  mononitrate 60 MG 24 hr tablet Commonly known as: IMDUR  Take 1 tablet (60 mg total) by mouth daily. Start taking on: Feb 02, 2024   magnesium  oxide 400 (240 Mg) MG tablet Commonly known as: MAG-OX Take 1  tablet (400 mg total) by mouth daily. Start taking on: Feb 02, 2024   potassium chloride  10 MEQ tablet Commonly known as: KLOR-CON  Take 1 tablet (10 mEq total) by mouth daily. Take While taking Torsemide /Demadex    Restasis  0.05 % ophthalmic emulsion Generic drug: cycloSPORINE  Place 1 drop into both eyes 2 (two) times daily.   rosuvastatin  5 MG tablet Commonly known as: CRESTOR  TAKE 1 TABLET (5 MG TOTAL) BY MOUTH DAILY.   Torsemide  60 MG Tabs Take 60 mg by mouth 2 (two) times daily.   Tresiba  FlexTouch 100 UNIT/ML FlexTouch Pen Generic drug: insulin  degludec Inject 6 Units into the skin daily.        Major procedures and Radiology Reports - PLEASE review detailed and final reports for all  details, in brief -   DG CHEST PORT 1 VIEW Result Date: 01/31/2024 CLINICAL DATA:  [id number].  Dyspnea and respiratory abnormalities. EXAM: PORTABLE CHEST 1 VIEW COMPARISON:  Portable chest 01/22/2024 FINDINGS: The lungs are emphysematous with mild chronic interstitial change. Stable moderate cardiomegaly. Interval increased central vessel prominence, increased central interstitial consolidation suggesting early interstitial edema. Trace pleural effusions. Findings consistent with a mild CHF pattern. No focal pneumonia is seen. Stable mediastinum. The aorta is tortuous and calcified. Osteopenia and thoracic spondylosis. IMPRESSION: 1. Mild CHF pattern.  Trace pleural effusions. 2. Emphysema and chronic interstitial change. 3. Aortic atherosclerosis. Electronically Signed   By: Denman Fischer M.D.   On: 01/31/2024 05:35   US  RENAL Result Date: 01/24/2024 CLINICAL DATA:  [id number] HTN (hypertension) [id number] EXAM: RENAL / URINARY TRACT ULTRASOUND COMPLETE COMPARISON:  Jan 24, 2024, July 19, 2023 FINDINGS: Technically difficult exam due to limited acoustic windows from overlying bowel gas. Right Kidney: Renal measurements: 9.3 x 3.5 x 5 cm = volume: 84 mL. Diffusely increased echogenicity. Lower pole cyst measuring 3.2 cm. A second smaller cyst measures 1.7 cm. No hydronephrosis or nephrolithiasis. Left Kidney: Renal measurements: 10.2 x 4.1 x 4.3 cm = volume: 94 mL. Diffusely increased echogenicity. Upper pole cyst measuring 3.2 cm. A second smaller cyst measures 2.1 cm. No hydronephrosis or nephrolithiasis. Bladder: Circumferential wall thickening of the urinary bladder. Other: Trace ascites. IMPRESSION: 1. No hydronephrosis or nephrolithiasis. Renal parenchymal changes consistent with chronic medical renal disease. 2. Circumferential wall thickening of the urinary bladder, which may be due to under distension or acute cystitis. Correlation with urinalysis recommended. Electronically Signed   By: Rance Burrows M.D.    On: 01/24/2024 16:57   CT RENAL STONE STUDY Result Date: 01/24/2024 CLINICAL DATA:  Stage 4 kidney disease, hyperglycemia. "Renal ischemia or infarction" EXAM: CT ABDOMEN AND PELVIS WITHOUT CONTRAST TECHNIQUE: Multidetector CT imaging of the abdomen and pelvis was performed following the standard protocol without IV contrast. RADIATION DOSE REDUCTION: This exam was performed according to the departmental dose-optimization program which includes automated exposure control, adjustment of the mA and/or kV according to patient size and/or use of iterative reconstruction technique. COMPARISON:  07/19/2023 FINDINGS: Lower chest: Diffuse cutaneous edema and small bilateral pleural effusions favoring third spacing of fluid. This has worsened from previous. Mild to moderate cardiomegaly. Low-density blood pool favors anemia. Coronary and aortic atherosclerosis. Hepatobiliary: Mildly hyperdense liver at 81 Hounsfield units in the parenchyma, raising the possibility of hemochromatosis. Stable fluid density liver lesions favoring cysts, no surveillance imaging of these lesions is indicated. Gallbladder poorly seen, possibly contracted. Pancreas: Ill definition of pancreatic margins due to edema in the mesentery. No definite abnormality. Spleen: Unremarkable Adrenals/Urinary Tract: Today's exam is not considered  sensitive or specific for renal infarct given the lack of IV contrast. Renal sonography with careful color Doppler assessment of the renal parenchyma could be utilized to assess for hypovascular segments. Stable fluid density renal lesions compatible with cysts. Stable nonobstructive left nephrolithiasis. Urinary bladder unremarkable. Adrenal glands are indistinct but no definite adrenal abnormality is observed. Stomach/Bowel: No dilated bowel. Postoperative findings along right lower quadrant small bowel. Formed stool in the colon. Sigmoid colon diverticulosis. Vascular/Lymphatic: Extensive atheromatous vascular  calcifications. No definite adenopathy. Reproductive: Vascular calcifications and suspected calcified uterine fibroids in the uterus. Other: Diffuse subcutaneous and mesenteric edema. No compelling findings of ascites. Musculoskeletal: IMPRESSION: 1. Today's exam is not considered sensitive or specific for renal infarct given the lack of IV contrast. Renal sonography with careful color Doppler assessment of the renal parenchyma could be utilized to assess for hypovascular segments. 2. Diffuse subcutaneous and mesenteric edema. Small bilateral pleural effusions. These findings have worsened from previous and suggest third spacing of fluid. 3. Mild to moderate cardiomegaly. 4. Low-density blood pool favors anemia. 5. Mildly hyperdense liver at 81 Hounsfield units in the parenchyma, raising the possibility of hemochromatosis. 6. Nonobstructive left nephrolithiasis. 7. Sigmoid colon diverticulosis. 8. Extensive atheromatous vascular calcifications. Aortic Atherosclerosis (ICD10-I70.0). Electronically Signed   By: Freida Jes M.D.   On: 01/24/2024 16:42   DG CHEST PORT 1 VIEW Result Date: 01/22/2024 CLINICAL DATA:  Hypothermia EXAM: PORTABLE CHEST 1 VIEW COMPARISON:  July 19, 2023 FINDINGS: The heart size and mediastinal contours are within normal limits. Both lungs are clear. The visualized skeletal structures are unremarkable. IMPRESSION: No active disease. Prominence of the interstitial markings could correlate with chronic interstitial lung disease Atheromatous of the descending thoracic aorta No acute cardiopulmonary infiltrates Electronically Signed   By: Fredrich Jefferson M.D.   On: 01/22/2024 15:05     Today   Subjective    Verdis Glade today has no new concerns -Husband at bedside - Oral intake is fair - Blood sugars and blood pressure less erratic -no conversational dyspnea, but patient has dyspnea on exertion which has improved   Patient has been seen and examined prior to discharge    Objective   Blood pressure (!) 166/69, pulse (!) 57, temperature 97.6 F (36.4 C), temperature source Oral, resp. rate 14, height 5' (1.524 m), weight 57.3 kg, SpO2 99%.   Intake/Output Summary (Last 24 hours) at 02/01/2024 1520 Last data filed at 02/01/2024 1009 Gross per 24 hour  Intake 240 ml  Output 200 ml  Net 40 ml    Exam Gen:- Awake Alert, no acute distress , no conversational dyspnea, but patient has dyspnea on exertion which has improved Neck-Supple Neck,No JVD,.  Lungs-  CTAB , good air movement bilaterally CV- S1, S2 normal, regular, 3/6 SM Abd-  +ve B.Sounds, Abd Soft, No tenderness,    Extremity/Skin:-Improved lower extremity edema,   good pulses Psych-affect is appropriate, oriented x3 Neuro-generalized weakness, no new focal deficits, no tremors    Data Review   CBC w Diff:  Lab Results  Component Value Date   WBC 5.9 01/23/2024   HGB 10.0 (L) 01/23/2024   HGB 9.2 (L) 07/27/2023   HCT 29.0 (L) 01/23/2024   HCT 28.2 (L) 07/27/2023   PLT 245 01/23/2024   PLT 256 07/27/2023   LYMPHOPCT 14 01/22/2024   MONOPCT 11 01/22/2024   EOSPCT 2 01/22/2024   BASOPCT 1 01/22/2024   CMP:  Lab Results  Component Value Date   NA 132 (L) 01/31/2024  NA 134 11/28/2023   K 4.1 01/31/2024   CL 94 (L) 01/31/2024   CO2 29 01/31/2024   BUN 93 (H) 01/31/2024   BUN 61 (H) 11/28/2023   CREATININE 3.50 (H) 01/31/2024   CREATININE 1.70 (H) 02/01/2023   PROT 4.9 (L) 01/23/2024   PROT 6.0 11/28/2023   ALBUMIN  2.7 (L) 01/31/2024   ALBUMIN  4.2 11/28/2023   BILITOT 0.4 01/23/2024   BILITOT 0.4 11/28/2023   ALKPHOS 80 01/23/2024   AST 19 01/23/2024   ALT 12 01/23/2024   Total Discharge time is about 33 minutes  Colin Dawley M.D on 02/01/2024 at 3:20 PM  Go to [email or web address] -  for contact info  [facility] - Office  [phone]

## 2024-02-04 ENCOUNTER — Inpatient Hospital Stay (HOSPITAL_COMMUNITY)
Admission: EM | Admit: 2024-02-04 | Discharge: 2024-02-13 | DRG: 291 | Disposition: A | Discharge status: Home Health | Attending: Family Medicine | Admitting: Family Medicine

## 2024-02-04 ENCOUNTER — Emergency Department (HOSPITAL_COMMUNITY)

## 2024-02-04 ENCOUNTER — Telehealth: Payer: Self-pay

## 2024-02-04 ENCOUNTER — Telehealth: Payer: Self-pay | Admitting: Family Medicine

## 2024-02-04 ENCOUNTER — Other Ambulatory Visit: Payer: Self-pay

## 2024-02-04 ENCOUNTER — Other Ambulatory Visit (HOSPITAL_COMMUNITY): Payer: Self-pay

## 2024-02-04 ENCOUNTER — Encounter (HOSPITAL_COMMUNITY): Payer: Self-pay | Admitting: *Deleted

## 2024-02-04 ENCOUNTER — Ambulatory Visit: Payer: Self-pay

## 2024-02-04 DIAGNOSIS — E1169 Type 2 diabetes mellitus with other specified complication: Secondary | ICD-10-CM | POA: Diagnosis not present

## 2024-02-04 DIAGNOSIS — Z86000 Personal history of in-situ neoplasm of breast: Secondary | ICD-10-CM

## 2024-02-04 DIAGNOSIS — D631 Anemia in chronic kidney disease: Secondary | ICD-10-CM | POA: Diagnosis not present

## 2024-02-04 DIAGNOSIS — E876 Hypokalemia: Secondary | ICD-10-CM | POA: Diagnosis present

## 2024-02-04 DIAGNOSIS — I1A0 Resistant hypertension: Secondary | ICD-10-CM | POA: Diagnosis not present

## 2024-02-04 DIAGNOSIS — Z7984 Long term (current) use of oral hypoglycemic drugs: Secondary | ICD-10-CM | POA: Diagnosis not present

## 2024-02-04 DIAGNOSIS — Z825 Family history of asthma and other chronic lower respiratory diseases: Secondary | ICD-10-CM

## 2024-02-04 DIAGNOSIS — R54 Age-related physical debility: Secondary | ICD-10-CM | POA: Diagnosis present

## 2024-02-04 DIAGNOSIS — N184 Chronic kidney disease, stage 4 (severe): Secondary | ICD-10-CM | POA: Diagnosis present

## 2024-02-04 DIAGNOSIS — Z794 Long term (current) use of insulin: Secondary | ICD-10-CM | POA: Diagnosis not present

## 2024-02-04 DIAGNOSIS — K219 Gastro-esophageal reflux disease without esophagitis: Secondary | ICD-10-CM | POA: Diagnosis present

## 2024-02-04 DIAGNOSIS — E11649 Type 2 diabetes mellitus with hypoglycemia without coma: Secondary | ICD-10-CM | POA: Diagnosis not present

## 2024-02-04 DIAGNOSIS — R680 Hypothermia, not associated with low environmental temperature: Secondary | ICD-10-CM | POA: Diagnosis not present

## 2024-02-04 DIAGNOSIS — N19 Unspecified kidney failure: Secondary | ICD-10-CM

## 2024-02-04 DIAGNOSIS — D649 Anemia, unspecified: Secondary | ICD-10-CM | POA: Diagnosis not present

## 2024-02-04 DIAGNOSIS — E1122 Type 2 diabetes mellitus with diabetic chronic kidney disease: Secondary | ICD-10-CM | POA: Diagnosis present

## 2024-02-04 DIAGNOSIS — Z1152 Encounter for screening for COVID-19: Secondary | ICD-10-CM

## 2024-02-04 DIAGNOSIS — Z8249 Family history of ischemic heart disease and other diseases of the circulatory system: Secondary | ICD-10-CM

## 2024-02-04 DIAGNOSIS — D509 Iron deficiency anemia, unspecified: Secondary | ICD-10-CM | POA: Diagnosis not present

## 2024-02-04 DIAGNOSIS — E871 Hypo-osmolality and hyponatremia: Secondary | ICD-10-CM | POA: Diagnosis not present

## 2024-02-04 DIAGNOSIS — R1310 Dysphagia, unspecified: Secondary | ICD-10-CM | POA: Diagnosis not present

## 2024-02-04 DIAGNOSIS — E16A2 Hypoglycemia level 2: Secondary | ICD-10-CM | POA: Diagnosis not present

## 2024-02-04 DIAGNOSIS — N179 Acute kidney failure, unspecified: Principal | ICD-10-CM | POA: Diagnosis present

## 2024-02-04 DIAGNOSIS — Z66 Do not resuscitate: Secondary | ICD-10-CM | POA: Diagnosis present

## 2024-02-04 DIAGNOSIS — I10 Essential (primary) hypertension: Secondary | ICD-10-CM | POA: Diagnosis not present

## 2024-02-04 DIAGNOSIS — Z833 Family history of diabetes mellitus: Secondary | ICD-10-CM

## 2024-02-04 DIAGNOSIS — I517 Cardiomegaly: Secondary | ICD-10-CM | POA: Diagnosis not present

## 2024-02-04 DIAGNOSIS — E1165 Type 2 diabetes mellitus with hyperglycemia: Secondary | ICD-10-CM | POA: Diagnosis not present

## 2024-02-04 DIAGNOSIS — I5033 Acute on chronic diastolic (congestive) heart failure: Secondary | ICD-10-CM | POA: Diagnosis not present

## 2024-02-04 DIAGNOSIS — I440 Atrioventricular block, first degree: Secondary | ICD-10-CM | POA: Diagnosis present

## 2024-02-04 DIAGNOSIS — Z885 Allergy status to narcotic agent status: Secondary | ICD-10-CM

## 2024-02-04 DIAGNOSIS — I160 Hypertensive urgency: Secondary | ICD-10-CM | POA: Diagnosis not present

## 2024-02-04 DIAGNOSIS — Z79899 Other long term (current) drug therapy: Secondary | ICD-10-CM | POA: Diagnosis not present

## 2024-02-04 DIAGNOSIS — Z83438 Family history of other disorder of lipoprotein metabolism and other lipidemia: Secondary | ICD-10-CM

## 2024-02-04 DIAGNOSIS — J9811 Atelectasis: Secondary | ICD-10-CM | POA: Diagnosis not present

## 2024-02-04 DIAGNOSIS — E8779 Other fluid overload: Secondary | ICD-10-CM

## 2024-02-04 DIAGNOSIS — E782 Mixed hyperlipidemia: Secondary | ICD-10-CM | POA: Diagnosis present

## 2024-02-04 DIAGNOSIS — I130 Hypertensive heart and chronic kidney disease with heart failure and stage 1 through stage 4 chronic kidney disease, or unspecified chronic kidney disease: Secondary | ICD-10-CM | POA: Diagnosis not present

## 2024-02-04 DIAGNOSIS — Z515 Encounter for palliative care: Secondary | ICD-10-CM | POA: Diagnosis not present

## 2024-02-04 DIAGNOSIS — I129 Hypertensive chronic kidney disease with stage 1 through stage 4 chronic kidney disease, or unspecified chronic kidney disease: Secondary | ICD-10-CM | POA: Diagnosis not present

## 2024-02-04 DIAGNOSIS — Z7189 Other specified counseling: Secondary | ICD-10-CM | POA: Diagnosis not present

## 2024-02-04 DIAGNOSIS — N189 Chronic kidney disease, unspecified: Secondary | ICD-10-CM | POA: Diagnosis not present

## 2024-02-04 DIAGNOSIS — E878 Other disorders of electrolyte and fluid balance, not elsewhere classified: Secondary | ICD-10-CM | POA: Diagnosis present

## 2024-02-04 DIAGNOSIS — I2510 Atherosclerotic heart disease of native coronary artery without angina pectoris: Secondary | ICD-10-CM | POA: Diagnosis present

## 2024-02-04 DIAGNOSIS — J90 Pleural effusion, not elsewhere classified: Secondary | ICD-10-CM | POA: Diagnosis not present

## 2024-02-04 DIAGNOSIS — E8770 Fluid overload, unspecified: Secondary | ICD-10-CM | POA: Diagnosis not present

## 2024-02-04 DIAGNOSIS — Z823 Family history of stroke: Secondary | ICD-10-CM

## 2024-02-04 DIAGNOSIS — Z888 Allergy status to other drugs, medicaments and biological substances status: Secondary | ICD-10-CM

## 2024-02-04 DIAGNOSIS — R0602 Shortness of breath: Secondary | ICD-10-CM | POA: Diagnosis not present

## 2024-02-04 DIAGNOSIS — T68XXXA Hypothermia, initial encounter: Secondary | ICD-10-CM | POA: Diagnosis not present

## 2024-02-04 DIAGNOSIS — Z800 Family history of malignant neoplasm of digestive organs: Secondary | ICD-10-CM

## 2024-02-04 DIAGNOSIS — I5031 Acute diastolic (congestive) heart failure: Secondary | ICD-10-CM | POA: Insufficient documentation

## 2024-02-04 LAB — GLUCOSE, CAPILLARY: Glucose-Capillary: 283 mg/dL — ABNORMAL HIGH (ref 70–99)

## 2024-02-04 LAB — COMPREHENSIVE METABOLIC PANEL WITH GFR
ALT: 14 U/L (ref 0–44)
AST: 21 U/L (ref 15–41)
Albumin: 2.9 g/dL — ABNORMAL LOW (ref 3.5–5.0)
Alkaline Phosphatase: 59 U/L (ref 38–126)
Anion gap: 13 (ref 5–15)
BUN: 109 mg/dL — ABNORMAL HIGH (ref 8–23)
CO2: 23 mmol/L (ref 22–32)
Calcium: 8.6 mg/dL — ABNORMAL LOW (ref 8.9–10.3)
Chloride: 89 mmol/L — ABNORMAL LOW (ref 98–111)
Creatinine, Ser: 3.9 mg/dL — ABNORMAL HIGH (ref 0.44–1.00)
GFR, Estimated: 11 mL/min — ABNORMAL LOW (ref >60)
Glucose, Bld: 173 mg/dL — ABNORMAL HIGH (ref 70–99)
Potassium: 4.4 mmol/L (ref 3.5–5.1)
Sodium: 125 mmol/L — ABNORMAL LOW (ref 135–145)
Total Bilirubin: 0.5 mg/dL (ref 0.0–1.2)
Total Protein: 5 g/dL — ABNORMAL LOW (ref 6.5–8.1)

## 2024-02-04 LAB — CBC
HCT: 23 % — ABNORMAL LOW (ref 36.0–46.0)
Hemoglobin: 7.9 g/dL — ABNORMAL LOW (ref 12.0–15.0)
MCH: 31.9 pg (ref 26.0–34.0)
MCHC: 34.3 g/dL (ref 30.0–36.0)
MCV: 92.7 fL (ref 80.0–100.0)
Platelets: 212 10*3/uL (ref 150–400)
RBC: 2.48 MIL/uL — ABNORMAL LOW (ref 3.87–5.11)
RDW: 13.7 % (ref 11.5–15.5)
WBC: 4.3 10*3/uL (ref 4.0–10.5)
nRBC: 0 % (ref 0.0–0.2)

## 2024-02-04 LAB — BASIC METABOLIC PANEL WITH GFR
Anion gap: 12 (ref 5–15)
BUN: 111 mg/dL — ABNORMAL HIGH (ref 8–23)
CO2: 26 mmol/L (ref 22–32)
Calcium: 8.8 mg/dL — ABNORMAL LOW (ref 8.9–10.3)
Chloride: 86 mmol/L — ABNORMAL LOW (ref 98–111)
Creatinine, Ser: 4.17 mg/dL — ABNORMAL HIGH (ref 0.44–1.00)
GFR, Estimated: 10 mL/min — ABNORMAL LOW (ref >60)
Glucose, Bld: 156 mg/dL — ABNORMAL HIGH (ref 70–99)
Potassium: 4.3 mmol/L (ref 3.5–5.1)
Sodium: 124 mmol/L — ABNORMAL LOW (ref 135–145)

## 2024-02-04 LAB — TROPONIN I (HIGH SENSITIVITY): Troponin I (High Sensitivity): 15 ng/L (ref <18)

## 2024-02-04 LAB — BRAIN NATRIURETIC PEPTIDE: B Natriuretic Peptide: 1335 pg/mL — ABNORMAL HIGH (ref 0.0–100.0)

## 2024-02-04 MED ORDER — CLONIDINE HCL 0.2 MG PO TABS
0.3000 mg | ORAL_TABLET | Freq: Two times a day (BID) | ORAL | Status: DC
  Administered 2024-02-04: 0.3 mg via ORAL
  Filled 2024-02-04 (×2): qty 1

## 2024-02-04 MED ORDER — TORSEMIDE 20 MG PO TABS
60.0000 mg | ORAL_TABLET | Freq: Every day | ORAL | Status: DC
  Administered 2024-02-04: 60 mg via ORAL
  Filled 2024-02-04: qty 3

## 2024-02-04 MED ORDER — ACETAMINOPHEN 325 MG PO TABS
650.0000 mg | ORAL_TABLET | Freq: Four times a day (QID) | ORAL | Status: DC | PRN
  Administered 2024-02-11: 650 mg via ORAL
  Filled 2024-02-04: qty 2

## 2024-02-04 MED ORDER — TORSEMIDE 20 MG PO TABS
60.0000 mg | ORAL_TABLET | Freq: Every day | ORAL | Status: AC
  Administered 2024-02-05: 60 mg via ORAL
  Filled 2024-02-04: qty 3

## 2024-02-04 MED ORDER — INSULIN GLARGINE-YFGN 100 UNIT/ML ~~LOC~~ SOLN
10.0000 [IU] | Freq: Every day | SUBCUTANEOUS | Status: DC
  Administered 2024-02-04 – 2024-02-05 (×2): 10 [IU] via SUBCUTANEOUS
  Filled 2024-02-04 (×3): qty 0.1
  Filled 2024-02-04: qty 10

## 2024-02-04 MED ORDER — ONDANSETRON HCL 4 MG PO TABS: 4.0000 mg | ORAL_TABLET | Freq: Four times a day (QID) | ORAL | Status: DC | PRN

## 2024-02-04 MED ORDER — ISOSORBIDE MONONITRATE ER 60 MG PO TB24
60.0000 mg | ORAL_TABLET | Freq: Every day | ORAL | Status: DC
  Filled 2024-02-04: qty 1

## 2024-02-04 MED ORDER — POLYETHYLENE GLYCOL 3350 17 G PO PACK: 17.0000 g | PACK | Freq: Every day | ORAL | Status: DC | PRN

## 2024-02-04 MED ORDER — ONDANSETRON HCL 4 MG/2ML IJ SOLN: 4.0000 mg | Freq: Four times a day (QID) | INTRAMUSCULAR | Status: DC | PRN

## 2024-02-04 MED ORDER — CARVEDILOL 12.5 MG PO TABS
12.5000 mg | ORAL_TABLET | Freq: Two times a day (BID) | ORAL | Status: DC
  Filled 2024-02-04: qty 1

## 2024-02-04 MED ORDER — AMLODIPINE BESYLATE 5 MG PO TABS
10.0000 mg | ORAL_TABLET | Freq: Every day | ORAL | Status: DC
Start: 2024-02-05 — End: 2024-02-05
  Filled 2024-02-04: qty 2

## 2024-02-04 MED ORDER — HEPARIN SODIUM (PORCINE) 5000 UNIT/ML IJ SOLN
5000.0000 [IU] | Freq: Three times a day (TID) | INTRAMUSCULAR | Status: DC
  Administered 2024-02-04 – 2024-02-13 (×26): 5000 [IU] via SUBCUTANEOUS
  Filled 2024-02-04 (×26): qty 1

## 2024-02-04 MED ORDER — INSULIN ASPART 100 UNIT/ML IJ SOLN
0.0000 [IU] | Freq: Three times a day (TID) | INTRAMUSCULAR | Status: DC
  Administered 2024-02-05: 2 [IU] via SUBCUTANEOUS
  Administered 2024-02-05 – 2024-02-07 (×4): 5 [IU] via SUBCUTANEOUS
  Administered 2024-02-08: 2 [IU] via SUBCUTANEOUS
  Administered 2024-02-09: 1 [IU] via SUBCUTANEOUS
  Administered 2024-02-09: 2 [IU] via SUBCUTANEOUS
  Administered 2024-02-10 (×2): 9 [IU] via SUBCUTANEOUS
  Administered 2024-02-10: 7 [IU] via SUBCUTANEOUS
  Administered 2024-02-11: 1 [IU] via SUBCUTANEOUS
  Administered 2024-02-11 (×2): 2 [IU] via SUBCUTANEOUS
  Administered 2024-02-12: 7 [IU] via SUBCUTANEOUS
  Administered 2024-02-12: 9 [IU] via SUBCUTANEOUS
  Administered 2024-02-12: 1 [IU] via SUBCUTANEOUS
  Administered 2024-02-13: 7 [IU] via SUBCUTANEOUS
  Administered 2024-02-13: 5 [IU] via SUBCUTANEOUS

## 2024-02-04 MED ORDER — HYDRALAZINE HCL 50 MG PO TABS
100.0000 mg | ORAL_TABLET | Freq: Three times a day (TID) | ORAL | Status: DC
Start: 2024-02-04 — End: 2024-02-05
  Administered 2024-02-04: 100 mg via ORAL
  Filled 2024-02-04 (×7): qty 2

## 2024-02-04 MED ORDER — ALBUTEROL SULFATE (2.5 MG/3ML) 0.083% IN NEBU
2.5000 mg | INHALATION_SOLUTION | RESPIRATORY_TRACT | Status: DC | PRN
  Administered 2024-02-07: 2.5 mg via RESPIRATORY_TRACT
  Filled 2024-02-04: qty 3

## 2024-02-04 MED ORDER — ACETAMINOPHEN 650 MG RE SUPP: 650.0000 mg | Freq: Four times a day (QID) | RECTAL | Status: DC | PRN

## 2024-02-04 NOTE — Telephone Encounter (Signed)
 Patient spouse Kenzie Thoreson  came by office and needs refill torsemide  60 MG TABS [[id number]]   Pharmacy: [facility]

## 2024-02-04 NOTE — ED Notes (Signed)
 Family at bedside.

## 2024-02-04 NOTE — H&P (Signed)
 History and Physical    Patient: Emily Jennings ZOX:[id number] DOB: 1934-02-20 DOA: 02/04/2024 DOS: the patient was seen and examined on 02/04/2024 PCP: Towanda Fret, MD  Patient coming from: Home  Chief Complaint:  Chief Complaint  Patient presents with   Shortness of Breath    HPI: Emily Jennings is a 89 y.o. female with medical history significant of diastolic heart failure, insulin  dependent type 2 diabetes, coronary artery disease, CKD stage IV who presented to the ED for shortness of breath.  She states that she was not able to take the new fluid medication.  Apparently her insurance company did not pay for the 60 mg tablet.  Patient reports she feels like she is swollen up to her chest.  Denies any chest discomfort, palpitations, fever, nausea, vomiting.  Endorses some intermittent cough and diarrhea but these are not new.   ED Course: ED vitals; afebrile, HR 60, RR 17, BP 126/54 satting 98% on room air, weight 59.4 kg; BMP showing hypochloremic hyponatremia, Na 124 Cl 86, glucose 156, BUN 111, serum creatinine 4.17, GFR 10 otherwise normal.  She has some normocytic anemia, hemoglobin 7.9; troponin 15, BNP 1335 with chest x-ray findings showing cardiomegaly with small left pleural effusion.  Patient was given 60 mg oral torsemide .  EDP consulted hospitalist service for possible admission.  Review of Systems: As mentioned in the history of present illness. All other systems reviewed and are negative. Past Medical History:  Diagnosis Date   Allergy    Anxiety    ANXIETY DISORDER, GENERALIZED 07/15/2007   Qualifier: Diagnosis of  By: Michaell Adolph     Arthritis    Cancer [facility]) 2009   breast, carcinoma in situ left   Carcinoma in situ of breast 05/21/2008   Qualifier: Diagnosis of  By: Rodolph Clap MD, Margaret  Diagnosed in 2009, completed 5 year course of tamoxifen, no evidence of recurrence    Carotid stenosis    11/16/2005  mild plaque formation and stenosis proximal right ECA    Cataract    Complication of anesthesia    Coronary artery disease    cardiac catheterization on 03/20/2006  LAD mid 40% stenosis, left circumflex mild 40% stenosis, RCA mid-vessel 40% to 50% lesion   EF 60%   Diabetes mellitus    GERD (gastroesophageal reflux disease)    Hernia, inguinal    left   Hyperglycemia    Hypertension    Insomnia 11/16/2011   Low blood potassium    Non-insulin  dependent type 2 diabetes mellitus (HCC)    Osteoporosis    Shortness of breath    2D Echocardiogram 01/26/2009   EF of greater than 55%, mild MR, mild TR, normal ventricular function   Thickened endometrium 10/26/2017   Noted by gyne in 2017, missed 6 month follow up, referred in 09/2017   Ventricular tachycardia, non-sustained (HCC)    developed during stress test 02/08/2006, spontaneously aborted, mild reversible apical defect   Past Surgical History:  Procedure Laterality Date   BOWEL RESECTION Left 07/19/2023   Procedure: SMALL BOWEL RESECTION;  Surgeon: Awilda Bogus, MD;  Location: [facility];  Service: General;  Laterality: Left;   BREAST LUMPECTOMY Left 2009   Left breast 2009   CATARACT EXTRACTION W/PHACO Left 10/28/2014   Procedure: PHACO EMULSION CATARACT EXTRACTION WITH INTRAOCULAR LENS IMPLANT LEFT EYE (IOC);  Surgeon: Ben Bracken, MD;  Location: [facility] OR;  Service: Ophthalmology;  Laterality: Left;   COLONOSCOPY     cyst removed from left  foot     INGUINAL HERNIA REPAIR Left 07/19/2023   Procedure: HERNIA REPAIR FEMORAL INCARCERATED;  Surgeon: Awilda Bogus, MD;  Location: [facility];  Service: General;  Laterality: Left;   REFRACTIVE SURGERY Left    Social History:  reports that she has never smoked. She has never been exposed to tobacco smoke. She has never used smokeless tobacco. She reports that she does not drink alcohol and does not use drugs.  Allergies  Allergen Reactions   Farxiga [Dapagliflozin] Other (See Comments)    DKA   Metformin  And Related Diarrhea    Lost appetite  and weight    Benadryl [Diphenhydramine Hcl] Hypertension   Citalopram Other (See Comments)    Unknown    Tramadol  Other (See Comments)    Felt light headed and dizzy   Amlodipine  Swelling   Crestor  [Rosuvastatin ] Swelling and Other (See Comments)    "Feet swelling" Makes her feel weak. Pt takes 5mg  at home    Family History  Problem Relation Age of Onset   Hypertension Mother    Hyperlipidemia Mother    Stroke Mother    Urticaria Mother    Cancer Father        pancreatic   Colon cancer Father    Heart disease Brother 40       bypass   Heart disease Brother 22       bypass   Arthritis Other    Asthma Other    Diabetes Other    Colon cancer Paternal Aunt    Esophageal cancer Neg Hx    Stomach cancer Neg Hx    Rectal cancer Neg Hx     Prior to Admission medications   Medication Sig Start Date End Date Taking? Authorizing Provider  albuterol  (VENTOLIN  HFA) 108 (90 Base) MCG/ACT inhaler Inhale 2 puffs into the lungs every 2 (two) hours as needed for wheezing or shortness of breath. 02/01/24  Yes Emokpae, Courage, MD  amLODipine  (NORVASC ) 10 MG tablet Take 1 tablet (10 mg total) by mouth daily. 02/02/24  Yes Colin Dawley, MD  carvedilol  (COREG ) 12.5 MG tablet Take 1 tablet (12.5 mg total) by mouth 2 (two) times daily with a meal. 02/01/24  Yes Emokpae, Courage, MD  cloNIDine  (CATAPRES ) 0.3 MG tablet Take 1 tablet (0.3 mg total) by mouth 2 (two) times daily. 02/01/24  Yes Emokpae, Courage, MD  hydrALAZINE  (APRESOLINE ) 100 MG tablet Take 1 tablet (100 mg total) by mouth 3 (three) times daily. 02/01/24  Yes Colin Dawley, MD  insulin  lispro (HUMALOG  KWIKPEN) 100 UNIT/ML KwikPen Inject 0-12 Units into the skin 3 (three) times daily. Short acting Humalog  insulin  per sliding scale 0-12 ---:-- Insulin  injection 0-12 Units 0-12 Units Subcutaneous, 3 times daily with meals CBG 70 - 120: 0 unit CBG 121 - 150: 0 unit  CBG 151 - 200: 2 unit CBG 201 - 250: 4 units CBG 251 - 300: 6 units CBG  301 - 350: 8 units  CBG 351 - 400: 10 units  CBG > 400: 12 units 02/01/24  Yes Emokpae, Courage, MD  isosorbide  mononitrate (IMDUR ) 60 MG 24 hr tablet Take 1 tablet (60 mg total) by mouth daily. 02/02/24  Yes Emokpae, Courage, MD  potassium chloride  (KLOR-CON ) 10 MEQ tablet Take 1 tablet (10 mEq total) by mouth daily. Take While taking Torsemide /Demadex  02/01/24  Yes Emokpae, Courage, MD  rosuvastatin  (CRESTOR ) 5 MG tablet TAKE 1 TABLET (5 MG TOTAL) BY MOUTH DAILY. 11/06/23  Yes Towanda Fret, MD  insulin  degludec (TRESIBA  FLEXTOUCH) 100 UNIT/ML FlexTouch Pen Inject 6 Units into the skin daily. 02/01/24   Colin Dawley, MD  magnesium  oxide (MAG-OX) 400 (240 Mg) MG tablet Take 1 tablet (400 mg total) by mouth daily. 02/02/24   Colin Dawley, MD  RESTASIS  0.05 % ophthalmic emulsion Place 1 drop into both eyes 2 (two) times daily. 11/01/23   [provider]  torsemide  60 MG TABS Take 60 mg by mouth 2 (two) times daily. 02/01/24   Colin Dawley, MD      Physical Exam: Vitals:   02/04/24 1815 02/04/24 1830 02/04/24 2000 02/04/24 2046  BP: 130/61 (!) 143/70 (!) 152/64 (!) 161/61  Pulse: (!) 53 (!) 55 60 61  Resp: 13 14 (!) 25 18  Temp:    (!) 97.4 F (36.3 C)  TempSrc:    Oral  SpO2: 96% 96% 99% 96%  Weight:      Height:       GEN:     alert, cooperative female and no distress    HENT:  mucus membranes moist,  no nasal discharge  EYES:  no scleral icterus or injection NECK:  supple, good ROM RESP: Rales at the lower lung base, no wheezing or rhonchi, no increased work of breathing on room air CVS:   regular rate and rhythm, distal pulses intact   ABD:  soft, non-tender; no palpable masses, no significant abdominal distention EXT:   Non-tender, baseline ROM, atraumatic, 3+ pitting edema at least to the knee NEURO:  speech normal, alert Skin:   warm and dry Psych: Normal affect, appropriate speech and behavior    Data Reviewed: EKG sinus rhythm without acute ST or T  wave changes  Relevant notes from primary care and specialist visits, past discharge summaries as available in EHR, including Care Everywhere. Prior diagnostic testing as pertinent to current admission diagnoses Updated medications and problem lists for reconciliation ED course, including vitals, labs, imaging, treatment and response to treatment Triage notes, nursing and pharmacy notes and ED provider's notes Notable results as noted in HPI   Assessment and Plan: Principal Problem:   Acute on chronic diastolic (congestive) heart failure (HCC) Active Problems:   Hyponatremia   Resistant hypertension   Dysphagia   Acute kidney injury superimposed on stage 4 chronic kidney disease (HCC)   Type 2 diabetes mellitus with other specified complication (HCC)   Anemia due to chronic kidney disease   Assessment and Plan: No notes have been filed under this hospital service. Service: Hospitalist  Acute on chronic diastolic heart failure Echo from 05/08/2023 showed LVEF 60-70% grade 2 diastolic dysfunction TSH 2.67 on 01/22/2024.  She does have some microcytic anemia which may be dilutional. Given 60 mg po torsemide  in ED.  - Admit to telemetry  - 60 mg of oral torsemide  ordered for tomorrow morning 10 AM; may need to switch to IV Lasix  in AM if serum creatine permits  - 2g sodium restriction - Strict I's and O's - Daily weights - Obtain echo  Acute kidney injury superimposed on chronic kidney disease Serum creatinine 4.14 on admission.  On day of discharge her serum creatinine was stable at 3.50.  Baseline appears to be 2.3-2.9 prior to previous admission. Suspect cardiorenal.  -Patient discharged on 02/01/2024 with okay from nephrology to continue torsemide . - May need nephrology consult if serum creatinine worsens and unable to safely diurese  Hyponatremia Sodium 124 on admission.  Has history of hyponatremia usually in the lower  130s. Suspect dilutional  vs cardiorenal related.  -  Repeat in a.m. - Fluid restrict to 1500 mL   Type 2 diabetes with hyperglycemia Patient with history of erratic episodes of hypoglycemia and hyperglycemia. -Semglee  10 units at bedtime -Sliding scale insulin  -Monitor CBG checks -Obtain A1C  Normocytic anemia Chronic.  Hemoglobin 7.9 on admission and was 10.0 on 01/23/2024.  May be delusional. - AM CBC  Hypertension Stable for now - Continue home medications: Amlodipine , clonidine , hydralazine , isosorbide  with torsemide  as above   Goals of care Discussed DNR, DNI and full code options with patient.  She asked to remain full code but agreeable to speak with palliative care.  Son and has been her support system.  Advance Care Planning:   Code Status: Full Code   Consults: Palliative care consult placed  Family Communication: Called husband but he did not answer  Severity of Illness: The appropriate patient status for this patient is INPATIENT. Inpatient status is judged to be reasonable and necessary in order to provide the required intensity of service to ensure the patient's safety. The patient's presenting symptoms, physical exam findings, and initial radiographic and laboratory data in the context of their chronic comorbidities is felt to place them at high risk for further clinical deterioration. Furthermore, it is not anticipated that the patient will be medically stable for discharge from the hospital within 2 midnights of admission.   * I certify that at the point of admission it is my clinical judgment that the patient will require inpatient hospital care spanning beyond 2 midnights from the point of admission due to high intensity of service, high risk for further deterioration and high frequency of surveillance required.*   Author: Fidel Huddle, DO 02/04/2024 9:53 PM  For on call review [email or web address].

## 2024-02-04 NOTE — ED Triage Notes (Signed)
 Pt with c/o breathing difficultly, no distress noted in triage. Pt just discharged from here on Friday.

## 2024-02-04 NOTE — Telephone Encounter (Signed)
 Updated needs prior authorization

## 2024-02-04 NOTE — Telephone Encounter (Signed)
 Chief Complaint: sob Symptoms: sob Frequency: since Friday Pertinent Negatives: Patient denies fever Disposition: [] ED /[x] Urgent Care (no appt availability in office) / [] Appointment(In office/virtual)/ []  [place] [facility]/ [] Home Care/ [] Refused Recommended Disposition /[]  Mobile Bus/ []  Follow-up with PCP Additional Notes: Husband states that patient was recently d/c form the hospital and now she is having some issues breathing. States she is having some sob with exertion and also when siting that comes and goes.  States that its the same as it was when she was in the hospital. Spoke with Kia at [facility] to see if any sooner appt available, and informed there were not.  Scheduled Hospital f/u for 02/13/24 and given the number to [facility] services.   Copied from CRM [id number]. Topic: Clinical - Red Word Triage >> Feb 04, 2024 10:06 AM DeAngela L wrote: Red Word that prompted transfer to Nurse Triage: Patient husband calling she is having breathing concern has been a concern for a few months and she is having breathing concerns again this morning, Patient just got home Friday 02/01/24 Reason for Disposition  [1] MILD difficulty breathing (e.g., minimal/no SOB at rest, SOB with walking, pulse <100) AND [2] NEW-onset or WORSE than normal  Protocols used: Breathing Difficulty-A-AH

## 2024-02-04 NOTE — Transitions of Care (Post Inpatient/ED Visit) (Signed)
   02/04/2024  Name: [name] MRN: [id number] DOB: 04/20/1934  Today's TOC FU Call Status: Today's TOC FU Call Status:: Unsuccessful Call (1st Attempt) Unsuccessful Call (1st Attempt) Date: 02/04/24  Attempted to reach the patient regarding the most recent Inpatient/ED visit.  Follow Up Plan: Additional outreach attempts will be made to reach the patient to complete the Transitions of Care (Post Inpatient/ED visit) call.   Orpha Blade, RN, BSN, CEN [employer]- Transition of Care Team.  [facility] [phone]

## 2024-02-04 NOTE — Telephone Encounter (Signed)
 Please advise. This medication was prescribed by MD in hospital but medication no longer available.

## 2024-02-04 NOTE — ED Provider Notes (Signed)
 [place] EMERGENCY DEPARTMENT AT [facility] Provider Note   CSN: [id number] Arrival date & time: 02/04/24  1609     History  Chief Complaint  Patient presents with   Shortness of Breath    Emily Jennings is a 89 y.o. female.   Shortness of Breath  This patient is an 89 year old female, she has a history of hypertension on amlodipine , on clonidine  and hydralazine  for blood pressure, she is on torsemide  for swelling and Imdur .  She takes insulin  for diabetes.  Last echocardiogram showed ejection fraction of 65 to 70% in August 2024, there is grade 2 diastolic dysfunction consistent with congestive heart failure.  This patient was discharged from the hospital several days ago.  She was told to take a low-salt diet, daily weights, stopping Lasix  and starting torsemide , stopping nifedipine  and starting Imdur , stopping 75/25 insulin  and starting Tresiba   She was admitted with hyperglycemia and an acute kidney injury.  Ultimately the patient was admitted for 10 days during which time the patient was treated for her diabetes and hyperglycemia and acute kidney injury, hyponatremia hypothermia and hypokalemia.  The patient's baseline creatinine was 2.3 but had gone up to 3.6 after diuresis patient remains living with her husband at home here locally  The patient complains of shortness of breath and states it is been going on for several days.  Of note the patient has only been out of the hospital for 3 days.  She denies coughing fever headache back pain nausea or vomiting or diarrhea.  She is weighing in at 59.4 kg, her discharge weight was 57.3 kg meaning that she is up approximately 2 kg since discharge    Home Medications Prior to Admission medications   Medication Sig Start Date End Date Taking? Authorizing Provider  albuterol  (VENTOLIN  HFA) 108 (90 Base) MCG/ACT inhaler Inhale 2 puffs into the lungs every 2 (two) hours as needed for wheezing or shortness of breath. 02/01/24    Colin Dawley, MD  amLODipine  (NORVASC ) 10 MG tablet Take 1 tablet (10 mg total) by mouth daily. 02/02/24   Colin Dawley, MD  carvedilol  (COREG ) 12.5 MG tablet Take 1 tablet (12.5 mg total) by mouth 2 (two) times daily with a meal. 02/01/24   Emokpae, Courage, MD  cloNIDine  (CATAPRES ) 0.3 MG tablet Take 1 tablet (0.3 mg total) by mouth 2 (two) times daily. 02/01/24   Colin Dawley, MD  Continuous Glucose Sensor (DEXCOM G7 SENSOR) MISC Patient is to change the sensor every 10 days to monitor her blood sugars as directed by the provider. 12/06/23   [name], Gebreselassie W, MD  hydrALAZINE  (APRESOLINE ) 100 MG tablet Take 1 tablet (100 mg total) by mouth 3 (three) times daily. 02/01/24   Colin Dawley, MD  insulin  degludec (TRESIBA  FLEXTOUCH) 100 UNIT/ML FlexTouch Pen Inject 6 Units into the skin daily. 02/01/24   Colin Dawley, MD  insulin  lispro (HUMALOG  KWIKPEN) 100 UNIT/ML KwikPen Inject 0-12 Units into the skin 3 (three) times daily. Short acting Humalog  insulin  per sliding scale 0-12 ---:-- Insulin  injection 0-12 Units 0-12 Units Subcutaneous, 3 times daily with meals CBG 70 - 120: 0 unit CBG 121 - 150: 0 unit  CBG 151 - 200: 2 unit CBG 201 - 250: 4 units CBG 251 - 300: 6 units CBG 301 - 350: 8 units  CBG 351 - 400: 10 units  CBG > 400: 12 units 02/01/24   Emokpae, Courage, MD  isosorbide  mononitrate (IMDUR ) 60 MG 24 hr tablet Take 1 tablet (  60 mg total) by mouth daily. 02/02/24   Colin Dawley, MD  magnesium  oxide (MAG-OX) 400 (240 Mg) MG tablet Take 1 tablet (400 mg total) by mouth daily. 02/02/24   Colin Dawley, MD  potassium chloride  (KLOR-CON ) 10 MEQ tablet Take 1 tablet (10 mEq total) by mouth daily. Take While taking Torsemide /Demadex  02/01/24   Colin Dawley, MD  RESTASIS  0.05 % ophthalmic emulsion Place 1 drop into both eyes 2 (two) times daily. 11/01/23   [provider]  rosuvastatin  (CRESTOR ) 5 MG tablet TAKE 1 TABLET (5 MG TOTAL) BY MOUTH DAILY. 11/06/23   Towanda Fret, MD  torsemide  60 MG TABS Take 60 mg by mouth 2 (two) times daily. 02/01/24   Colin Dawley, MD      Allergies    Farxiga [dapagliflozin], Metformin  and related, Benadryl [diphenhydramine hcl], Citalopram, Tramadol , Amlodipine , and Crestor  [rosuvastatin ]    Review of Systems   Review of Systems  Respiratory:  Positive for shortness of breath.   All other systems reviewed and are negative.   Physical Exam Updated Vital Signs BP (!) 143/70   Pulse (!) 55   Temp (!) 97.5 F (36.4 C)   Resp 14   Ht 1.524 m (5')   Wt 59.4 kg   SpO2 96%   BMI 25.58 kg/m  Physical Exam Vitals and nursing note reviewed.  Constitutional:      General: She is not in acute distress.    Appearance: She is well-developed.  HENT:     Head: Normocephalic and atraumatic.     Mouth/Throat:     Pharynx: No oropharyngeal exudate.  Eyes:     General: No scleral icterus.       Right eye: No discharge.        Left eye: No discharge.     Conjunctiva/sclera: Conjunctivae normal.     Pupils: Pupils are equal, round, and reactive to light.  Neck:     Thyroid : No thyromegaly.     Vascular: No JVD.  Cardiovascular:     Rate and Rhythm: Normal rate and regular rhythm.     Heart sounds: Murmur heard.     No friction rub. No gallop.     Comments: Loud systolic heart murmur Pulmonary:     Effort: Pulmonary effort is normal. No respiratory distress.     Breath sounds: Normal breath sounds. No wheezing or rales.     Comments: The patient is mildly tachypneic, she does not have any rales or wheezing Abdominal:     General: Bowel sounds are normal. There is no distension.     Palpations: Abdomen is soft. There is no mass.     Tenderness: There is no abdominal tenderness.  Musculoskeletal:        General: No tenderness. Normal range of motion.     Cervical back: Normal range of motion and neck supple.     Right lower leg: Edema present.     Left lower leg: Edema present.  Lymphadenopathy:      Cervical: No cervical adenopathy.  Skin:    General: Skin is warm and dry.     Findings: No erythema or rash.  Neurological:     Mental Status: She is alert.     Coordination: Coordination normal.  Psychiatric:        Behavior: Behavior normal.     ED Results / Procedures / Treatments   Labs (all labs ordered are listed, but only abnormal results are displayed) Labs Reviewed  BASIC  METABOLIC PANEL WITH GFR - Abnormal; Notable for the following components:      Result Value   Sodium 124 (*)    Chloride 86 (*)    Glucose, Bld 156 (*)    BUN 111 (*)    Creatinine, Ser 4.17 (*)    Calcium  8.8 (*)    GFR, Estimated 10 (*)    All other components within normal limits  CBC - Abnormal; Notable for the following components:   RBC 2.48 (*)    Hemoglobin 7.9 (*)    HCT 23.0 (*)    All other components within normal limits  BRAIN NATRIURETIC PEPTIDE - Abnormal; Notable for the following components:   B Natriuretic Peptide 1,335.0 (*)    All other components within normal limits  TROPONIN I (HIGH SENSITIVITY)    EKG EKG Interpretation Date/Time:  Monday Feb 04 2024 16:39:44 EDT Ventricular Rate:  59 PR Interval:  182 QRS Duration:  92 QT Interval:  430 QTC Calculation: 426 R Axis:   64  Text Interpretation: Sinus or ectopic atrial rhythm Probable left atrial enlargement Abnormal T, consider ischemia, lateral leads Borderline ST elevation, anterior leads Confirmed by Early Glisson ([id number]) on 02/04/2024 5:32:47 PM  Radiology DG Chest Portable 1 View Result Date: 02/04/2024 CLINICAL DATA:  Shortness of breath EXAM: PORTABLE CHEST 1 VIEW COMPARISON:  01/31/2024 FINDINGS: Cardiomegaly. Aortic atherosclerosis. Small left pleural effusion with left base atelectasis. Right lung clear. No edema. No acute bony abnormality. IMPRESSION: Cardiomegaly. Small left pleural effusion with left base atelectasis. Electronically Signed   By: Janeece Mechanic M.D.   On: 02/04/2024 18:19     Procedures .Critical Care  Performed by: Early Glisson, MD Authorized by: Early Glisson, MD   Critical care provider statement:    Critical care time (minutes):  45   Critical care time was exclusive of:  Separately billable procedures and treating other patients   Critical care was necessary to treat or prevent imminent or life-threatening deterioration of the following conditions:  Respiratory failure and endocrine crisis   Critical care was time spent personally by me on the following activities:  Development of treatment plan with patient or surrogate, discussions with consultants, evaluation of patient's response to treatment, examination of patient, obtaining history from patient or surrogate, review of old charts, re-evaluation of patient's condition, pulse oximetry, ordering and review of radiographic studies, ordering and review of laboratory studies and ordering and performing treatments and interventions   I assumed direction of critical care for this patient from another provider in my specialty: no     Care discussed with: admitting provider   Comments:           Medications Ordered in ED Medications  torsemide  (DEMADEX ) tablet 60 mg (60 mg Oral Given 02/04/24 1824)    ED Course/ Medical Decision Making/ A&P                                 Medical Decision Making Amount and/or Complexity of Data Reviewed Labs: ordered. Radiology: ordered.  Risk Prescription drug management. Decision regarding hospitalization.    This patient presents to the ED for concern of shortness of breath, this involves an extensive number of treatment options, and is a complaint that carries with it a high risk of complications and morbidity.  The differential diagnosis includes congestive heart failure, pneumonia, pneumothorax   Co morbidities that complicate the patient evaluation  Diabetes, hypertension, CHF,  chronic kidney disease   Additional history obtained:  Additional  history obtained from medical record External records from outside source obtained and reviewed including prior history and discharge summary as well as her course in the hospital over the last 2 weeks   Lab Tests:  I Ordered, and personally interpreted labs.  The pertinent results include: Hyponatremic to 124, this is a new hyponatremia.  Hypochloremic to 86, progressive uremia and renal failure as well, anemic to 7.9 consistent with values in the past, BNP of 1300 similar to what it was 9 days ago.   Imaging Studies ordered:  I ordered imaging studies including pleural effusion on chest x-ray I independently visualized and interpreted imaging which showed cardiomegaly and pleural effusion I agree with the radiologist interpretation   Cardiac Monitoring: / EKG:  The patient was maintained on a cardiac monitor.  I personally viewed and interpreted the cardiac monitored which showed an underlying rhythm of: Sinus bradycardia   Problem List / ED Course / Critical interventions / Medication management  This patient presents with multiple abnormalities, she is tachypneic and appears to be fluid overloaded clinically, she has gained about 7 pounds in the last 3 days based on weight and has not been able to get her torsemide  filled at the pharmacy.  Evidently she was prescribed 60 mg a day but t patient's insurance does not cover 60 mg tablets and the pharmacy would not give her 20 mg tablets either, now she is fluid overloaded.  The hyponatremia, hypochloremia and progressive renal failure are also concerning.  She is still making urine I ordered medication including torsemide  by mouth for fluid overload Reevaluation of the patient after these medicines showed that the patient no significant change I have reviewed the patients home medicines and have made adjustments as needed   Consultations Obtained:  I requested consultation with the hospitalist,  and discussed lab and imaging findings  as well as pertinent plan - they recommend: Admission   Social Determinants of Health:  Elderly, heart failure   Test / Admission - Considered:  Admit to higher level of care         Final Clinical Impression(s) / ED Diagnoses Final diagnoses:  Acute renal failure superimposed on chronic kidney disease, unspecified acute renal failure type, unspecified CKD stage (HCC)  Uremia  Other hypervolemia  Hyponatremia    Rx / DC Orders ED Discharge Orders     None         Early Glisson, MD 02/04/24 1857

## 2024-02-05 ENCOUNTER — Other Ambulatory Visit (HOSPITAL_COMMUNITY): Payer: Self-pay | Admitting: *Deleted

## 2024-02-05 ENCOUNTER — Inpatient Hospital Stay (HOSPITAL_COMMUNITY)

## 2024-02-05 ENCOUNTER — Other Ambulatory Visit (HOSPITAL_COMMUNITY)

## 2024-02-05 DIAGNOSIS — I5033 Acute on chronic diastolic (congestive) heart failure: Secondary | ICD-10-CM | POA: Diagnosis not present

## 2024-02-05 DIAGNOSIS — E871 Hypo-osmolality and hyponatremia: Secondary | ICD-10-CM

## 2024-02-05 DIAGNOSIS — I5031 Acute diastolic (congestive) heart failure: Secondary | ICD-10-CM

## 2024-02-05 DIAGNOSIS — Z7189 Other specified counseling: Secondary | ICD-10-CM

## 2024-02-05 DIAGNOSIS — N179 Acute kidney failure, unspecified: Secondary | ICD-10-CM

## 2024-02-05 DIAGNOSIS — I130 Hypertensive heart and chronic kidney disease with heart failure and stage 1 through stage 4 chronic kidney disease, or unspecified chronic kidney disease: Principal | ICD-10-CM

## 2024-02-05 DIAGNOSIS — N184 Chronic kidney disease, stage 4 (severe): Secondary | ICD-10-CM | POA: Diagnosis not present

## 2024-02-05 DIAGNOSIS — N189 Chronic kidney disease, unspecified: Secondary | ICD-10-CM

## 2024-02-05 DIAGNOSIS — E1169 Type 2 diabetes mellitus with other specified complication: Secondary | ICD-10-CM | POA: Diagnosis not present

## 2024-02-05 LAB — ECHOCARDIOGRAM COMPLETE
AR max vel: 1.01 cm2
AV Area VTI: 1.15 cm2
AV Area mean vel: 1.09 cm2
AV Mean grad: 24 mmHg
AV Peak grad: 45.4 mmHg
Ao pk vel: 3.37 m/s
Area-P 1/2: 3.27 cm2
Height: 60 in
MV M vel: 5.85 m/s
MV Peak grad: 136.9 mmHg
MV VTI: 1.66 cm2
P 1/2 time: 666 ms
Radius: 0.5 cm
S' Lateral: 2.2 cm
Weight: 2014.12 [oz_av]

## 2024-02-05 LAB — CBC
HCT: 23.4 % — ABNORMAL LOW (ref 36.0–46.0)
Hemoglobin: 8 g/dL — ABNORMAL LOW (ref 12.0–15.0)
MCH: 31.5 pg (ref 26.0–34.0)
MCHC: 34.2 g/dL (ref 30.0–36.0)
MCV: 92.1 fL (ref 80.0–100.0)
Platelets: 202 10*3/uL (ref 150–400)
RBC: 2.54 MIL/uL — ABNORMAL LOW (ref 3.87–5.11)
RDW: 13.6 % (ref 11.5–15.5)
WBC: 4 10*3/uL (ref 4.0–10.5)
nRBC: 0 % (ref 0.0–0.2)

## 2024-02-05 LAB — IRON AND TIBC
Iron: 46 ug/dL (ref 28–170)
Saturation Ratios: 17 % (ref 10.4–31.8)
TIBC: 274 ug/dL (ref 250–450)
UIBC: 228 ug/dL

## 2024-02-05 LAB — GLUCOSE, CAPILLARY
Glucose-Capillary: 120 mg/dL — ABNORMAL HIGH (ref 70–99)
Glucose-Capillary: 189 mg/dL — ABNORMAL HIGH (ref 70–99)
Glucose-Capillary: 248 mg/dL — ABNORMAL HIGH (ref 70–99)
Glucose-Capillary: 243 mg/dL — ABNORMAL HIGH (ref 70–99)

## 2024-02-05 LAB — FERRITIN: Ferritin: 107 ng/mL (ref 11–307)

## 2024-02-05 LAB — BRAIN NATRIURETIC PEPTIDE: B Natriuretic Peptide: 1316 pg/mL — ABNORMAL HIGH (ref 0.0–100.0)

## 2024-02-05 LAB — HEMOGLOBIN A1C
Hgb A1c MFr Bld: 7.4 % — ABNORMAL HIGH (ref 4.8–5.6)
Mean Plasma Glucose: 165.68 mg/dL

## 2024-02-05 LAB — VITAMIN B12: Vitamin B-12: 1300 pg/mL — ABNORMAL HIGH (ref 180–914)

## 2024-02-05 LAB — FOLATE: Folate: 6.7 ng/mL (ref >5.9)

## 2024-02-05 MED ORDER — CHLORHEXIDINE GLUCONATE CLOTH 2 % EX PADS
6.0000 | MEDICATED_PAD | Freq: Every day | CUTANEOUS | Status: DC
Start: 2024-02-06 — End: 2024-02-13
  Administered 2024-02-06 – 2024-02-13 (×7): 6 via TOPICAL

## 2024-02-05 MED ORDER — ORAL CARE MOUTH RINSE: 15.0000 mL | OROMUCOSAL | Status: DC | PRN

## 2024-02-05 MED ORDER — FUROSEMIDE 10 MG/ML IJ SOLN
80.0000 mg | Freq: Three times a day (TID) | INTRAMUSCULAR | Status: DC
  Administered 2024-02-05 (×3): 80 mg via INTRAVENOUS
  Filled 2024-02-05 (×3): qty 8

## 2024-02-05 MED ORDER — IRON SUCROSE 200 MG IVPB - SIMPLE MED
200.0000 mg | Freq: Once | Status: DC
  Filled 2024-02-05: qty 110

## 2024-02-05 MED ORDER — AMLODIPINE BESYLATE 5 MG PO TABS
5.0000 mg | ORAL_TABLET | Freq: Every day | ORAL | Status: DC
  Administered 2024-02-05 – 2024-02-06 (×2): 5 mg via ORAL
  Filled 2024-02-05: qty 1

## 2024-02-05 MED ORDER — SODIUM CHLORIDE 0.9 % IV SOLN
200.0000 mg | Freq: Once | INTRAVENOUS | Status: AC
  Administered 2024-02-05: 200 mg via INTRAVENOUS
  Filled 2024-02-05: qty 10

## 2024-02-05 NOTE — Progress Notes (Signed)
 PROGRESS NOTE  Emily Jennings QIO:[id number] DOB: [birth year] DOA: 02/04/2024 PCP: Towanda Fret, MD  Brief History:  89 year old female with a history of HFpEF, diabetes mellitus type 2, coronary disease, CKD stage IV presenting with shortness of breath for 2 days.  She also complains of worsening lower extremity edema and orthopnea symptoms.  Patient denies any fever, chills, chest pain, headache, neck pain, nausea, vomiting, diarrhea.  The patient is discharge weight was 59.4 kg.  Admission weight 57.3 kg.  The patient's serum creatinine was 3.50 at the time of discharge  The patient is a difficult historian, but it appears that the patient has had some difficulty getting the torsemide  that she was prescribed at the time of her previous hospital discharge.  In the ED, the patient was afebrile hemodynamically stable with oxygen saturation 1% room air.  WBC 4.0, hemoglobin 8.0, platelets 202.  Sodium 125, potassium 4.4, bicarbonate 23, serum creatinine 3.90.  LFTs were unremarkable.  Chest x-ray showed increased interstitial markings.  The patient was started on IV furosemide .  Nephrology was consulted to assist with management.  Notably, the patient had recent hospital admission from 01/22/2024 to 02/01/2024 during which time she was treated for acute on chronic renal failure and accelerated hypertension.  Her serum creatinine peaked at 3.6 with previous renal baseline around 2.3.  The patient was discharged home with torsemide  60 mg twice daily, amlodipine  10 mg daily, clonidine  0.3 mg twice daily, hydralazine  100 mg 3 times daily, isosorbide  60 mg daily, carvedilol  12.5 mg twice daily.  Her 75/25 insulin  was discontinued, and she was discharged home with Tresiba  6 units daily along with lispro sliding scale.   Assessment/Plan: Acute on chronic HFpEF - 05/17/2023 EF 65-70%, grade 2 DD, mild to moderate AS - Remains clinically fluid overloaded - Start IV furosemide  - Repeat  echo  Acute on chronic renal failure--CKD 4-5 - Baseline creatinine 3.2-3.5 - Presented with serum creatinine 4.7 - Nephrology consult  Diabetes mellitus type 2 with hyperglycemia - 10/08/2023 hemoglobin A1c 7.0 - Repeat A1c - NovoLog  sliding scale  Essential hypertension - Holding carvedilol , clonidine , hydralazine , Imdur  as BPs trending down -continue amlodipine  for now - Monitor BPs  Hyponatremia - Secondary to fluid overload - Anticipate improvement with diuresis  Anemia of CKD - Check anemia panel  Mixed hyperlipidemia - Continue statin        Family Communication:  called spouse, left VM  Consultants:  renal  Code Status:  FULL   DVT Prophylaxis:  [place] Heparin    Procedures: As Listed in Progress Note Above  Antibiotics: None       Subjective:  She continues to have orthopnea and shortness of breath.  She denies any chest pain.  She denies any fevers, chills, nausea, vomiting, diarrhea or abdominal pain. Objective: Vitals:   02/05/24 0110 02/05/24 0500 02/05/24 0530 02/05/24 0530  BP: (!) 149/57  (!) 134/59 (!) 134/59  Pulse: 60  (!) 52 (!) 52  Resp: 18  18 18   Temp: (!) 97 F (36.1 C)  (!) 97.3 F (36.3 C) (!) 97.3 F (36.3 C)  TempSrc:    Oral  SpO2: 96%  100% 100%  Weight:  57.1 kg    Height:        Intake/Output Summary (Last 24 hours) at 02/05/2024 0816 Last data filed at 02/05/2024 0511 Gross per 24 hour  Intake --  Output 250 ml  Net -250 ml   Weight  change:  Exam:  General:  Pt is alert, follows commands appropriately, not in acute distress HEENT: No icterus, No thrush, No neck mass, [place]/AT Cardiovascular: RRR, S1/S2, no rubs, no gallops +JVD Respiratory: bibasilar crackles. No wheeze Abdomen: Soft/+BS, non tender, non distended, no guarding Extremities: 2+LE edema, No lymphangitis, No petechiae, No rashes, no synovitis   Data Reviewed: I have personally reviewed following labs and imaging studies Basic Metabolic  Panel: Recent Labs  Lab 01/30/24 0510 01/31/24 0446 02/04/24 1653 02/04/24 2121  NA 131* 132* 124* 125*  K 4.4 4.1 4.3 4.4  CL 92* 94* 86* 89*  CO2 26 29 26 23   GLUCOSE 219* 68* 156* 173*  BUN 88* 93* 111* 109*  CREATININE 3.58* 3.50* 4.17* 3.90*  CALCIUM  9.0 8.9 8.8* 8.6*  PHOS  --  5.5*  --   --    Liver Function Tests: Recent Labs  Lab 01/31/24 0446 02/04/24 2121  AST  --  21  ALT  --  14  ALKPHOS  --  59  BILITOT  --  0.5  PROT  --  5.0*  ALBUMIN  2.7* 2.9*   No results for input(s): "LIPASE", "AMYLASE" in the last 168 hours. No results for input(s): "AMMONIA" in the last 168 hours. Coagulation Profile: No results for input(s): "INR", "PROTIME" in the last 168 hours. CBC: Recent Labs  Lab 02/04/24 1653 02/05/24 0517  WBC 4.3 4.0  HGB 7.9* 8.0*  HCT 23.0* 23.4*  MCV 92.7 92.1  PLT 212 202   Cardiac Enzymes: No results for input(s): "CKTOTAL", "CKMB", "CKMBINDEX", "TROPONINI" in the last 168 hours. BNP: Invalid input(s): "POCBNP" CBG: Recent Labs  Lab 02/01/24 0750 02/01/24 0955 02/01/24 1216 02/04/24 2250 02/05/24 0739  GLUCAP 243* 325* 328* 283* 120*   HbA1C: No results for input(s): "HGBA1C" in the last 72 hours. Urine analysis:    Component Value Date/Time   COLORURINE STRAW (A) 01/25/2024 0821   APPEARANCEUR CLEAR 01/25/2024 0821   LABSPEC 1.007 01/25/2024 0821   PHURINE 5.0 01/25/2024 0821   GLUCOSEU 150 (A) 01/25/2024 0821   HGBUR NEGATIVE 01/25/2024 0821   HGBUR trace-intact 04/28/2010 1259   BILIRUBINUR NEGATIVE 01/25/2024 0821   BILIRUBINUR neg 09/29/2013 1341   KETONESUR NEGATIVE 01/25/2024 0821   PROTEINUR >=300 (A) 01/25/2024 0821   UROBILINOGEN 0.2 09/29/2013 1341   UROBILINOGEN 0.2 12/28/2010 1743   NITRITE NEGATIVE 01/25/2024 0821   LEUKOCYTESUR NEGATIVE 01/25/2024 0821   Sepsis Labs: @LABRCNTIP (procalcitonin:4,lacticidven:4) )No results found for this or any previous visit (from the past 240 hours).   Scheduled  Meds:  amLODipine   10 mg Oral Daily   carvedilol   12.5 mg Oral BID WC   cloNIDine   0.3 mg Oral BID   heparin   5,000 Units Subcutaneous Q8H   hydrALAZINE   100 mg Oral TID   insulin  aspart  0-9 Units Subcutaneous TID WC   insulin  glargine-yfgn  10 Units Subcutaneous QHS   isosorbide  mononitrate  60 mg Oral Daily   torsemide   60 mg Oral Daily   Continuous Infusions:  Procedures/Studies: DG Chest Portable 1 View Result Date: 02/04/2024 CLINICAL DATA:  Shortness of breath EXAM: PORTABLE CHEST 1 VIEW COMPARISON:  01/31/2024 FINDINGS: Cardiomegaly. Aortic atherosclerosis. Small left pleural effusion with left base atelectasis. Right lung clear. No edema. No acute bony abnormality. IMPRESSION: Cardiomegaly. Small left pleural effusion with left base atelectasis. Electronically Signed   By: Janeece Mechanic M.D.   On: 02/04/2024 18:19   DG CHEST PORT 1 VIEW Result Date: 01/31/2024 CLINICAL DATA:  [id number].  Dyspnea and respiratory abnormalities. EXAM: PORTABLE CHEST 1 VIEW COMPARISON:  Portable chest 01/22/2024 FINDINGS: The lungs are emphysematous with mild chronic interstitial change. Stable moderate cardiomegaly. Interval increased central vessel prominence, increased central interstitial consolidation suggesting early interstitial edema. Trace pleural effusions. Findings consistent with a mild CHF pattern. No focal pneumonia is seen. Stable mediastinum. The aorta is tortuous and calcified. Osteopenia and thoracic spondylosis. IMPRESSION: 1. Mild CHF pattern.  Trace pleural effusions. 2. Emphysema and chronic interstitial change. 3. Aortic atherosclerosis. Electronically Signed   By: Denman Fischer M.D.   On: 01/31/2024 05:35   US  RENAL Result Date: 01/24/2024 CLINICAL DATA:  [id number] HTN (hypertension) [id number] EXAM: RENAL / URINARY TRACT ULTRASOUND COMPLETE COMPARISON:  Jan 24, 2024, July 19, 2023 FINDINGS: Technically difficult exam due to limited acoustic windows from overlying bowel gas. Right Kidney:  Renal measurements: 9.3 x 3.5 x 5 cm = volume: 84 mL. Diffusely increased echogenicity. Lower pole cyst measuring 3.2 cm. A second smaller cyst measures 1.7 cm. No hydronephrosis or nephrolithiasis. Left Kidney: Renal measurements: 10.2 x 4.1 x 4.3 cm = volume: 94 mL. Diffusely increased echogenicity. Upper pole cyst measuring 3.2 cm. A second smaller cyst measures 2.1 cm. No hydronephrosis or nephrolithiasis. Bladder: Circumferential wall thickening of the urinary bladder. Other: Trace ascites. IMPRESSION: 1. No hydronephrosis or nephrolithiasis. Renal parenchymal changes consistent with chronic medical renal disease. 2. Circumferential wall thickening of the urinary bladder, which may be due to under distension or acute cystitis. Correlation with urinalysis recommended. Electronically Signed   By: Rance Burrows M.D.   On: 01/24/2024 16:57   CT RENAL STONE STUDY Result Date: 01/24/2024 CLINICAL DATA:  Stage 4 kidney disease, hyperglycemia. "Renal ischemia or infarction" EXAM: CT ABDOMEN AND PELVIS WITHOUT CONTRAST TECHNIQUE: Multidetector CT imaging of the abdomen and pelvis was performed following the standard protocol without IV contrast. RADIATION DOSE REDUCTION: This exam was performed according to the departmental dose-optimization program which includes automated exposure control, adjustment of the mA and/or kV according to patient size and/or use of iterative reconstruction technique. COMPARISON:  07/19/2023 FINDINGS: Lower chest: Diffuse cutaneous edema and small bilateral pleural effusions favoring third spacing of fluid. This has worsened from previous. Mild to moderate cardiomegaly. Low-density blood pool favors anemia. Coronary and aortic atherosclerosis. Hepatobiliary: Mildly hyperdense liver at 81 Hounsfield units in the parenchyma, raising the possibility of hemochromatosis. Stable fluid density liver lesions favoring cysts, no surveillance imaging of these lesions is indicated. Gallbladder  poorly seen, possibly contracted. Pancreas: Ill definition of pancreatic margins due to edema in the mesentery. No definite abnormality. Spleen: Unremarkable Adrenals/Urinary Tract: Today's exam is not considered sensitive or specific for renal infarct given the lack of IV contrast. Renal sonography with careful color Doppler assessment of the renal parenchyma could be utilized to assess for hypovascular segments. Stable fluid density renal lesions compatible with cysts. Stable nonobstructive left nephrolithiasis. Urinary bladder unremarkable. Adrenal glands are indistinct but no definite adrenal abnormality is observed. Stomach/Bowel: No dilated bowel. Postoperative findings along right lower quadrant small bowel. Formed stool in the colon. Sigmoid colon diverticulosis. Vascular/Lymphatic: Extensive atheromatous vascular calcifications. No definite adenopathy. Reproductive: Vascular calcifications and suspected calcified uterine fibroids in the uterus. Other: Diffuse subcutaneous and mesenteric edema. No compelling findings of ascites. Musculoskeletal: IMPRESSION: 1. Today's exam is not considered sensitive or specific for renal infarct given the lack of IV contrast. Renal sonography with careful color Doppler assessment of the renal parenchyma could be utilized to assess for hypovascular segments. 2. Diffuse  subcutaneous and mesenteric edema. Small bilateral pleural effusions. These findings have worsened from previous and suggest third spacing of fluid. 3. Mild to moderate cardiomegaly. 4. Low-density blood pool favors anemia. 5. Mildly hyperdense liver at 81 Hounsfield units in the parenchyma, raising the possibility of hemochromatosis. 6. Nonobstructive left nephrolithiasis. 7. Sigmoid colon diverticulosis. 8. Extensive atheromatous vascular calcifications. Aortic Atherosclerosis (ICD10-I70.0). Electronically Signed   By: Freida Jes M.D.   On: 01/24/2024 16:42   DG CHEST PORT 1 VIEW Result Date:  01/22/2024 CLINICAL DATA:  Hypothermia EXAM: PORTABLE CHEST 1 VIEW COMPARISON:  July 19, 2023 FINDINGS: The heart size and mediastinal contours are within normal limits. Both lungs are clear. The visualized skeletal structures are unremarkable. IMPRESSION: No active disease. Prominence of the interstitial markings could correlate with chronic interstitial lung disease Atheromatous of the descending thoracic aorta No acute cardiopulmonary infiltrates Electronically Signed   By: Fredrich Jefferson M.D.   On: 01/22/2024 15:05    Demaris Fillers, DO  [facility]  If 7PM-7AM, please contact night-coverage [email or web address] Password [email or web address] 02/05/2024, 8:16 AM   LOS: 1 day

## 2024-02-05 NOTE — Consult Note (Signed)
 Consultation Note Date: 02/05/2024   Patient Name: Emily Jennings  DOB: 1934/08/30  MRN: [id number]  Age / Sex: 89 y.o., female  PCP: Towanda Fret, MD Referring Physician: Marvia Slocumb DO  Reason for Consultation:  goals of care  HPI/Patient Profile: 89 y.o. female  with past medical history of CHF, DM2, CAD, CKD IV, HTN, recent admission 5/6-5/16 for hyperglycemia and acute kidney injury admitted on 02/04/2024 with CHF exacerbation with severe SOB due to not being able to fill torsemide  and acute kidney injury- possibly cardiorenal. She was changed to torsemide  during recent hospitalization. Palliative medicine consulted for goals of care.    Primary Decision Maker PATIENT - surrogate is spouse- Emily Jennings  Discussion: Chart reviewed including labs, progress notes, imaging from this and previous encounters.  Labs reviewed- Cr 4.17 on admission, down to 3.90.  Reviewed attending MD and nephrology note- no need for urgent dialysis- patient adamantly does not want dialysis. Starting IV lasix . ECHO pending. Chest xray reviewed- noted small pleural effusion, no acute infiltrates or edema.  Met with patient and her spouse Emily Jennings at bedside. Patient awake, alert. She is hard of hearing.  Emily Jennings lives at home with her spouse Emily Jennings. He assists in her care. 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  Deetya feels she has lived a good long life. Her mother died at a young age.  Her primary goals of care are to allow her to continue spending time and enjoying her family.  We discussed the relationship between her heart and her kidneys. If her kidneys and heart were to worsen she would possibly be at end of life. She is very clear she would not want dialysis to prolong her life.  Advanced Care Planning- 25 mins With patient and spouse's permission  Advanced Care Planning was discussed due to her significant illnesses of heart failure and chronic kidney disease and cardiorenal syndrome.  If Emily Jennings were at end of life it would be important for her to be surrounded by her family.  She does not want dialysis- and understands that if her kidney function worsens and fails to recover then she would be at end of life without dialysis.  Regarding code status- she feels that if it is her time to go- she wants to go. She does not want to undergo CPR or artificial life support. She would want to be at peace. Emily Jennings is supportive of her.  Her goal for this hospitalization is to stabilize so she can return home.    SUMMARY OF RECOMMENDATIONS --CHF with cardiorenal syndrome- hopeful for improvement, would not want HD if renal function worsens -Code status- DNR -GOC is to stabilize and return home with spouse    Code Status/Advance Care Planning:   Code Status: Limited: Do not attempt resuscitation (DNR) -DNR-LIMITED -Do Not Intubate/DNI     Prognosis:   Unable to determine  Discharge Planning: Home with Home Health  Primary Diagnoses: Present on Admission:  Type 2  diabetes mellitus with other specified complication (HCC)  Resistant hypertension  Hyponatremia  Acute on chronic diastolic (congestive) heart failure (HCC)  Acute kidney injury superimposed on stage 4 chronic kidney disease (HCC)   Review of Systems  Constitutional:  Positive for activity change.  Respiratory:  Positive for shortness of breath.     Physical Exam Vitals and nursing note reviewed.     Vital Signs: BP (!) 150/63   Pulse (!) 52   Temp (!) 97.3 F (36.3 C) (Oral)   Resp 20   Ht 5' (1.524 m)   Wt 57.1 kg   SpO2 100%   BMI 24.58 kg/m  Pain Scale: 0-10   Pain Score: 0-No pain   SpO2: SpO2: 100 % O2 Device:SpO2: 100 % O2 Flow Rate: .   IO: Intake/output summary:  Intake/Output Summary (Last 24 hours) at 02/05/2024 1341 Last data filed at 02/05/2024  0959 Gross per 24 hour  Intake 240 ml  Output 250 ml  Net -10 ml    LBM: Last BM Date : 02/04/24 Baseline Weight: Weight: 59.4 kg Most recent weight: Weight: 57.1 kg       Thank you for this consult. Palliative medicine will continue to follow and assist as needed.   Signed by: Micki Alas, AGNP-C Palliative Medicine  Time includes:   Preparing to see the patient (e.g., review of tests) Obtaining and/or reviewing separately obtained history Performing a medically necessary appropriate examination and/or evaluation Counseling and educating the patient/family/caregiver Ordering medications, tests, or procedures Referring and communicating with other health care professionals (when not reported separately) Documenting clinical information in the electronic or other health record Independently interpreting results (not reported separately) and communicating results to the patient/family/caregiver Care coordination (not reported separately) Clinical documentation   Please contact Palliative Medicine Team phone at [phone] for questions and concerns.  For individual provider: See Tilford Foley

## 2024-02-05 NOTE — Progress Notes (Signed)
 Pt transferred to ICU4. Receiving RN at bedside. Pt's husband called and informed of transfer.

## 2024-02-05 NOTE — Evaluation (Signed)
 Physical Therapy Evaluation Patient Details Name: Emily Jennings MRN: [id number] DOB: 09-12-1934 Today's Date: 02/05/2024  History of Present Illness  Emily Jennings is a 89 y.o. female with medical history significant of diastolic heart failure, insulin  dependent type 2 diabetes, coronary artery disease, CKD stage IV who presented to the ED for shortness of breath.  She states that she was not able to take the new fluid medication.  Apparently her insurance company did not pay for the 60 mg tablet.  Patient reports she feels like she is swollen up to her chest.  Denies any chest discomfort, palpitations, fever, nausea, vomiting.  Endorses some intermittent cough and diarrhea but these are not new.   Clinical Impression  Patient demonstrates slow labored movement for sitting up at bedside, very unsteady on feet requiring repeated verbal/tactile cueing to avoid loss of balance during ambulation mostly due to poor standing balance and tendency to take hands off walker to reach for walls, object for support. Patient tolerated sitting up in chair after therapy - nursing staff aware. Patient will benefit from continued skilled physical therapy in hospital and recommended venue below to increase strength, balance, endurance for safe ADLs and gait.           If plan is discharge home, recommend the following: A lot of help with walking and/or transfers;A little help with bathing/dressing/bathroom;Help with stairs or ramp for entrance;Assistance with cooking/housework;A lot of help with bathing/dressing/bathroom   Can travel by private vehicle   Yes    Equipment Recommendations None recommended by PT  Recommendations for Other Services       Functional Status Assessment Patient has had a recent decline in their functional status and demonstrates the ability to make significant improvements in function in a reasonable and predictable amount of time.     Precautions / Restrictions  Precautions Precautions: Fall Recall of Precautions/Restrictions: Impaired Restrictions Weight Bearing Restrictions Per Provider Order: No      Mobility  Bed Mobility Overal bed mobility: Needs Assistance Bed Mobility: Supine to Sit     Supine to sit: Min assist     General bed mobility comments: able to pull self sitting using RUE with Min assist    Transfers Overall transfer level: Needs assistance Equipment used: Rolling walker (2 wheels) Transfers: Sit to/from Stand, Bed to chair/wheelchair/BSC Sit to Stand: Min assist   Step pivot transfers: Min assist, Mod assist       General transfer comment: unsteady labored movement required verbal/tactile cueing for safey due to taking hands off walker and reaching for nearby objects for support    Ambulation/Gait Ambulation/Gait assistance: Mod assist Gait Distance (Feet): 22 Feet Assistive device: Rolling walker (2 wheels) Gait Pattern/deviations: Decreased step length - right, Decreased step length - left, Decreased stride length, Staggering left, Staggering right Gait velocity: decreased     General Gait Details: unsteady labored movement frequently taking hands off walker resulting in loss of balance requiring tactile cueing to avoid falling  Stairs            Wheelchair Mobility     Tilt Bed    Modified Rankin (Stroke Patients Only)       Balance Overall balance assessment: Needs assistance Sitting-balance support: Feet supported, No upper extremity supported Sitting balance-Leahy Scale: Fair Sitting balance - Comments: fair/good seated at EOB   Standing balance support: Reliant on assistive device for balance, During functional activity, Bilateral upper extremity supported Standing balance-Leahy Scale: Poor Standing balance comment: using RW  Pertinent Vitals/Pain Pain Assessment Pain Assessment: No/denies pain    Home Living Family/patient expects to  be discharged to:: Private residence Living Arrangements: Spouse/significant other;Children Available Help at Discharge: Family;Available 24 hours/day Type of Home: House Home Access: Stairs to enter Entrance Stairs-Rails: None Entrance Stairs-Number of Steps: 1   Home Layout: One level Home Equipment: Cane - single point;Shower Counsellor (2 wheels)      Prior Function Prior Level of Function : Independent/Modified Independent             Mobility Comments: household ambulation using SPC or RW ADLs Comments: Assisted by family     Extremity/Trunk Assessment   Upper Extremity Assessment Upper Extremity Assessment: Generalized weakness    Lower Extremity Assessment Lower Extremity Assessment: Generalized weakness    Cervical / Trunk Assessment Cervical / Trunk Assessment: Kyphotic  Communication   Communication Communication: No apparent difficulties Factors Affecting Communication: Difficulty expressing self    Cognition Arousal: Alert Behavior During Therapy: WFL for tasks assessed/performed   PT - Cognitive impairments: No apparent impairments, Difficult to assess                         Following commands: Impaired Following commands impaired: Follows one step commands with increased time, Follows multi-step commands inconsistently     Cueing Cueing Techniques: Verbal cues, Tactile cues     General Comments      Exercises     Assessment/Plan    PT Assessment Patient needs continued PT services  PT Problem List Decreased strength;Decreased activity tolerance;Decreased balance;Decreased mobility       PT Treatment Interventions DME instruction;Gait training;Stair training;Functional mobility training;Therapeutic activities;Therapeutic exercise;Balance training;Patient/family education    PT Goals (Current goals can be found in the Care Plan section)  Acute Rehab PT Goals Patient Stated Goal: return home with family to  assist PT Goal Formulation: With patient Time For Goal Achievement: 02/21/24 Potential to Achieve Goals: Good    Frequency Min 3X/week     Co-evaluation               AM-PAC PT "6 Clicks" Mobility  Outcome Measure Help needed turning from your back to your side while in a flat bed without using bedrails?: A Little Help needed moving from lying on your back to sitting on the side of a flat bed without using bedrails?: A Little Help needed moving to and from a bed to a chair (including a wheelchair)?: A Lot Help needed standing up from a chair using your arms (e.g., wheelchair or bedside chair)?: A Lot Help needed to walk in hospital room?: A Lot Help needed climbing 3-5 steps with a railing? : A Lot 6 Click Score: 14    End of Session   Activity Tolerance: Patient tolerated treatment well;Patient limited by fatigue Patient left: in chair;with call bell/phone within reach;with chair alarm set Nurse Communication: Mobility status PT Visit Diagnosis: Unsteadiness on feet (R26.81);Other abnormalities of gait and mobility (R26.89);Muscle weakness (generalized) (M62.81)    Time: 1610-9604 PT Time Calculation (min) (ACUTE ONLY): 22 min   Charges:   PT Evaluation $PT Eval Moderate Complexity: 1 Mod PT Treatments $Therapeutic Activity: 8-22 mins PT General Charges $$ ACUTE PT VISIT: 1 Visit         1:45 PM, 02/05/24 Walton Guppy, MPT Physical Therapist with [facility] [address] [phone] office [phone] mobile phone

## 2024-02-05 NOTE — Hospital Course (Addendum)
 89 year old female with a history of HFpEF, diabetes mellitus type 2, coronary disease, CKD stage IV presenting with shortness of breath for 2 days.  She also complains of worsening lower extremity edema and orthopnea symptoms.  Patient denies any fever, chills, chest pain, headache, neck pain, nausea, vomiting, diarrhea.  The patient is discharge weight was 59.4 kg.  Admission weight 57.3 kg.  The patient's serum creatinine was 3.50 at the time of discharge  The patient is a difficult historian, but it appears that the patient has had some difficulty getting the torsemide  that she was prescribed at the time of her previous hospital discharge.  In the ED, the patient was afebrile hemodynamically stable with oxygen saturation 1% room air.  WBC 4.0, hemoglobin 8.0, platelets 202.  Sodium 125, potassium 4.4, bicarbonate 23, serum creatinine 3.90.  LFTs were unremarkable.  Chest x-ray showed increased interstitial markings.  The patient was started on IV furosemide .  Nephrology was consulted to assist with management.  Notably, the patient had recent hospital admission from 01/22/2024 to 02/01/2024 during which time she was treated for acute on chronic renal failure and accelerated hypertension.  Her serum creatinine peaked at 3.6 with previous renal baseline around 2.3.  The patient was discharged home with torsemide  60 mg twice daily, amlodipine  10 mg daily, clonidine  0.3 mg twice daily, hydralazine  100 mg 3 times daily, isosorbide  60 mg daily, carvedilol  12.5 mg twice daily.  Her 75/25 insulin  was discontinued, and she was discharged home with Tresiba  6 units daily along with lispro sliding scale.

## 2024-02-05 NOTE — Progress Notes (Signed)
   02/05/24 2112  Assess: MEWS Score  Temp (!) 94.6 F (34.8 C)  BP (!) 148/58  MAP (mmHg) 85  Pulse Rate (!) 57  ECG Heart Rate (!) 58  Resp 18  Level of Consciousness Alert  SpO2 100 %  O2 Device Room Air  Assess: MEWS Score  MEWS Temp 2  MEWS Systolic 0  MEWS Pulse 0  MEWS RR 0  MEWS LOC 0  MEWS Score 2  MEWS Score Color Yellow  Assess: if the MEWS score is Yellow or Red  Were vital signs accurate and taken at a resting state? Yes  Does the patient meet 2 or more of the SIRS criteria? No  MEWS guidelines implemented  Yes, yellow  Treat  MEWS Interventions Considered administering scheduled or prn medications/treatments as ordered  Take Vital Signs  Increase Vital Sign Frequency  Yellow: Q2hr x1, continue Q4hrs until patient remains green for 12hrs  Escalate  MEWS: Escalate Yellow: Discuss with charge nurse and consider notifying provider and/or RRT  Notify: Charge Nurse/RN  Name of Charge Nurse/RN Notified [facility] RN  Provider Notification  Provider Name/Title Elyse Hand  Date Provider Notified 02/05/24  Time Provider Notified 2112  Method of Notification Page  Notification Reason Other (Comment) (low temp)  Provider response See new orders  Date of Provider Response 02/05/24  Time of Provider Response 2125  Assess: SIRS CRITERIA  SIRS Temperature  1  SIRS Respirations  0  SIRS Pulse 0  SIRS WBC 0  SIRS Score Sum  1

## 2024-02-05 NOTE — Progress Notes (Signed)
*  PRELIMINARY RESULTS* Echocardiogram 2D Echocardiogram has been performed.  Bernis Brisker 02/05/2024, 12:06 PM

## 2024-02-05 NOTE — TOC Initial Note (Addendum)
 Transition of Care [facility]) - Initial/Assessment Note    Patient Details  Name: Emily Jennings MRN: [id number] Date of Birth: January 10, 1934  [facility] [place] CM/SW Contact:    Orelia Binet, RN Phone Number: 02/05/2024, 11:57 AM  Clinical Narrative:          Patient admitted with acute on chronic CHF. Recently discharged home and assessed. PT is recommending SHF or supervision in the home. CM spoke with her husband. They manage well in the home. Gasper Karst has not has the chance to start services. They were scheduled to see her today. TOC will get new orders and update [facility] at discharge. They wish to return home with home health.           Barriers to Discharge: Continued Medical Work up   Patient Goals and CMS Choice Patient states their goals for this hospitalization and ongoing recovery are:: return home. CMS Medicare.gov Compare Post Acute Care list provided to:: Patient Represenative (must comment) Choice offered to / list presented to : Spouse    Expected Discharge Plan and Services      Living arrangements for the past 2 months: Single Family Home          Prior Living Arrangements/Services Living arrangements for the past 2 months: Single Family Home Lives with:: Spouse      Current home services: DME   Activities of Daily Living   ADL Screening (condition at time of admission) Independently performs ADLs?: No Does the patient have a NEW difficulty with bathing/dressing/toileting/self-feeding that is expected to last >3 days?: No Does the patient have a NEW difficulty with getting in/out of bed, walking, or climbing stairs that is expected to last >3 days?: Yes (Initiates electronic notice to provider for possible PT consult) Does the patient have a NEW difficulty with communication that is expected to last >3 days?: No Is the patient deaf or have difficulty hearing?: No Does the patient have difficulty seeing, even when wearing glasses/contacts?: No Does the  patient have difficulty concentrating, remembering, or making decisions?: No  Emotional Assessment       Orientation: : Oriented to Self, Oriented to Place, Oriented to Situation Alcohol / Substance Use: Not Applicable Psych Involvement: No (comment)  Admission diagnosis:  Acute diastolic heart failure (HCC) [I50.31] Hyponatremia [E87.1] Uremia [N19] Other hypervolemia [E87.79] Acute renal failure superimposed on chronic kidney disease, unspecified acute renal failure type, unspecified CKD stage (HCC) [N17.9, N18.9] Patient Active Problem List   Diagnosis Date Noted   Acute diastolic heart failure (HCC) 02/04/2024   Anemia due to chronic kidney disease 02/04/2024   Type 2 diabetes mellitus with hyperglycemia (HCC) 01/22/2024   Insulin  long-term use (HCC) 12/05/2023   Type 2 diabetes mellitus with other specified complication (HCC) 09/25/2023   Acute kidney injury superimposed on stage 4 chronic kidney disease (HCC) 08/14/2023   Small bowel obstruction (HCC) 07/19/2023   Ischemic bowel disease (HCC) 07/19/2023   Incarcerated left inguinal hernia 07/18/2023   Acute kidney injury superimposed on chronic kidney disease (HCC) 07/18/2023   Hypoglycemia 07/18/2023   Uncontrolled type 2 diabetes mellitus with hypoglycemia, with long-term current use of insulin  (HCC) 07/04/2023   Dysphagia 07/04/2023   HCAP (healthcare-associated pneumonia) 07/03/2023   Essential hypertension 06/15/2023   Acute on chronic renal failure (HCC) 06/02/2023   Acute diastolic CHF (congestive heart failure) (HCC) 06/01/2023   Cerebellar edema (HCC) 05/23/2023   Mixed hyperlipidemia 05/23/2023   DNR (do not resuscitate) 05/17/2023   Vocal cord dysfunction 05/17/2023  DNR (do not resuscitate) discussion 05/17/2023   Goals of care, counseling/discussion 05/17/2023   Right cerebellar intraparenchymal hemorrhage due to hypertension 05/16/2023   Unsteady gait when walking 04/03/2023   Hospital discharge  follow-up 03/06/2023   Bilateral kidney stones 03/06/2023   Hip swelling, right 02/27/2023   DKA (diabetic ketoacidosis) (HCC) 02/17/2023   High anion gap metabolic acidosis 02/17/2023   Pseudohyponatremia 02/17/2023   Hypoalbuminemia due to protein-calorie malnutrition (HCC) 02/17/2023   Dehydration 02/17/2023   Hip pain 02/15/2023   Encounter for Medicare annual examination with abnormal findings 10/16/2022   Vitamin D  deficiency 10/16/2022   Acute exacerbation of CHF (congestive heart failure) (HCC) 09/13/2022   Acute on chronic congestive heart failure with left ventricular diastolic dysfunction (HCC) 09/13/2022   Chronic kidney disease (CKD) stage G3b/A2, moderately decreased glomerular filtration rate (GFR) between 30-44 mL/min/1.73 square meter and albuminuria creatinine ratio between 30-299 mg/g (HCC) 09/13/2022   Mild aortic valve stenosis 09/13/2022   Dyspnea on exertion 09/05/2022   CHF (congestive heart failure) (HCC) 09/05/2022   Acute on chronic diastolic (congestive) heart failure (HCC) 09/05/2022   Hyperkalemia 04/09/2022   Hyperglycemic crisis due to diabetes mellitus (HCC) 03/25/2022   Hyponatremia 03/25/2022   Acute metabolic encephalopathy 03/22/2022   Hypothermia 03/22/2022   Heart murmur 03/22/2022   Bilateral lower extremity edema 03/22/2022   Syncope and collapse 01/13/2021   Lip swelling 10/30/2020   CKD stage 4 due to type 2 diabetes mellitus (HCC) 10/30/2020   Tubular adenoma of colon 10/26/2017   Diabetes (HCC) 07/27/2016   Osteoporosis 07/28/2014   Diabetic hypertension-nephrosis syndrome (HCC) 03/23/2014   Type 2 diabetes mellitus with stage 3a chronic kidney disease, with long-term current use of insulin  (HCC) 09/29/2013   Allergic rhinitis 01/27/2013   GERD (gastroesophageal reflux disease) 01/12/2013   CVD (cardiovascular disease) 07/17/2011   Hypokalemia 08/31/2008   Hyperlipidemia LDL goal <100 07/15/2007   Resistant hypertension 07/15/2007    PCP:  Towanda Fret, MD Pharmacy:   [facility] [phone] - [place], [place] - [address] AT [facility] [address] [place] [address] Phone: [phone] Fax: [phone]  [facility]) - [place], [place] - [address] [facility] [address] [place] [place] [address] Phone: [phone] Fax: [phone]  [facility] [address] [place] [address] Phone: [phone] Fax: [phone]     Social Drivers of Health (SDOH) Social History: SDOH Screenings   Food Insecurity: No Food Insecurity (02/05/2024)  Housing: Low Risk  (02/05/2024)  Transportation Needs: No Transportation Needs (02/05/2024)  Utilities: Not At Risk (02/05/2024)  Alcohol Screen: Low Risk  (11/15/2022)  Depression (PHQ2-9): Low Risk  (10/24/2023)  Financial Resource Strain: Low Risk  (12/28/2022)  Physical Activity: Sufficiently Active (11/15/2022)  Social Connections: Socially Integrated (02/05/2024)  Stress: No Stress Concern Present (12/28/2022)  Tobacco Use: Low Risk  (02/04/2024)   SDOH Interventions:     Readmission Risk Interventions    02/01/2024    9:55 AM 01/30/2024   12:15 PM 01/23/2024    8:19 AM  Readmission Risk Prevention Plan  Transportation Screening Complete Complete Complete  HRI or Home Care Consult Complete Complete Complete  Social Work Consult for Recovery Care Planning/Counseling Complete Complete Complete  Palliative Care Screening Complete Complete Not Applicable  Medication Review Oceanographer) Complete Complete Complete

## 2024-02-05 NOTE — Consult Note (Signed)
 Reason for Consult: AKI/CKD stage IV Referring Physician: Tat, MD  Emily Jennings is an 89 y.o. female with past medical history significant for hypertension, type II DM, CAD, CHF with preserved EF/diastolic dysfunction, CKD stage IV (baseline Scr 1.8-2) who was recently admitted from 5/6-5/16/25 with hypertensive urgency, hyperglycemia, and acute on chronic HFpEF complicated by AKI/CKD stage IV due to cardiorenal syndrome.  She was followed by our service until her discharge date.  Please see original consult note dated 01/25/24.  She was discharged on torsemide , however she did not pick up the prescription and presented again to [facility] ED on 02/04/24 complaining of SOB and DOE.  She also had worsening edema.  In the ED, Temp 97.5, Bp 143/70, HR 55, SpO2 96%.  Labs were notable for Na 124, Cl 86, BUN 111, Cr 4.17, Hgb 7.9, BNP 1335.  She was readmitted for acute on chronic HFpEF and we were reconsulted due to AKI/CKD stage IV due to cardiorenal syndrome. The trend in Scr is seen below.  Labs for today are currently pending.  She is followed by Dr. Carrolyn Clan as an outpatient.  She states that she does not want dialysis.  She denies any N/V, dysgeusia, anorexia, or malaise.  Trend in Creatinine: Creatinine, Ser  Date/Time Value Ref Range Status  02/04/2024 09:21 PM 3.90 (H) 0.44 - 1.00 mg/dL Final  95/62/1308 65:78 PM 4.17 (H) 0.44 - 1.00 mg/dL Final  46/96/2952 84:13 AM 3.50 (H) 0.44 - 1.00 mg/dL Final  24/40/1027 25:36 AM 3.58 (H) 0.44 - 1.00 mg/dL Final  64/40/3474 25:95 AM 3.60 (H) 0.44 - 1.00 mg/dL Final  63/87/5643 32:95 AM 3.17 (H) 0.44 - 1.00 mg/dL Final  18/84/1660 63:01 AM 3.23 (H) 0.44 - 1.00 mg/dL Final  60/06/9322 55:73 AM 2.73 (H) 0.44 - 1.00 mg/dL Final  22/10/5425 06:23 AM 2.93 (H) 0.44 - 1.00 mg/dL Final  76/28/3151 76:16 AM 3.09 (H) 0.44 - 1.00 mg/dL Final  07/37/1062 69:48 AM 3.29 (H) 0.44 - 1.00 mg/dL Final  54/62/7035 00:93 AM 3.70 (H) 0.44 - 1.00 mg/dL Final  81/82/9937  16:96 AM  1.85 (H) 0.57 - 1.00 mg/dL Final  78/93/8101 75:10 PM 2.31 (H) 0.57 - 1.00 mg/dL Final  25/85/2778 24:23 AM 2.39 (H) 0.57 - 1.00 mg/dL Final  53/61/4431 54:00 AM 2.58 (H) 0.44 - 1.00 mg/dL Final  86/76/1950 93:26 AM 2.82 (H) 0.44 - 1.00 mg/dL Final  71/24/5809 98:33 AM 3.95 (H) 0.44 - 1.00 mg/dL Final  82/50/5397 67:34 AM 4.69 (H) 0.44 - 1.00 mg/dL Final  19/37/9024 09:73 AM 4.51 (H) 0.44 - 1.00 mg/dL Final  53/29/9242 68:34 PM 4.56 (H) 0.44 - 1.00 mg/dL Final  19/62/2297 98:92 AM 3.09 (H) 0.57 - 1.00 mg/dL Final  11/94/1740 81:44 AM 2.64 (H) 0.44 - 1.00 mg/dL Final  81/85/6314 97:02 AM 2.65 (H) 0.44 - 1.00 mg/dL Final  63/78/5885 02:77 AM 2.97 (H) 0.44 - 1.00 mg/dL Final  41/28/7867 67:20 PM 2.95 (H) 0.44 - 1.00 mg/dL Final  94/70/9628 36:62 AM 2.09 (H) 0.44 - 1.00 mg/dL Final  94/76/5465 03:54 AM 2.05 (H) 0.44 - 1.00 mg/dL Final  65/68/1275 17:00 AM 2.38 (H) 0.44 - 1.00 mg/dL Final  17/49/4496 75:91 AM 2.30 (H) 0.44 - 1.00 mg/dL Final  63/84/6659 93:57 PM 2.42 (H) 0.44 - 1.00 mg/dL Final  01/77/9390 30:09 PM 2.38 (H) 0.44 - 1.00 mg/dL Final  23/30/0762 26:33 AM 2.46 (H) 0.44 - 1.00 mg/dL Final  35/45/6256 38:93 AM 2.41 (H) 0.44 - 1.00 mg/dL Final  73/42/8768  05:11 PM 2.59 (H) 0.44 - 1.00 mg/dL Final  13/04/6577 46:96 AM 2.44 (H) 0.44 - 1.00 mg/dL Final  29/52/8413 24:40 PM 2.30 (H) 0.57 - 1.00 mg/dL Final  07/15/2535 64:40 AM 2.18 (H) 0.44 - 1.00 mg/dL Final  34/74/2595 63:87 AM 2.22 (H) 0.44 - 1.00 mg/dL Final  56/43/3295 18:84 AM 2.14 (H) 0.44 - 1.00 mg/dL Final  16/60/6301 60:10 PM 2.08 (H) 0.44 - 1.00 mg/dL Final  93/23/5573 22:02 AM 2.12 (H) 0.44 - 1.00 mg/dL Final  54/27/0623 76:28 AM 2.41 (H) 0.44 - 1.00 mg/dL Final  31/51/7616 07:37 AM 2.57 (H) 0.44 - 1.00 mg/dL Final  10/62/6948 54:62 AM 2.30 (H) 0.44 - 1.00 mg/dL Final  70/35/0093 81:82 AM 2.74 (H) 0.44 - 1.00 mg/dL Final  99/37/1696 78:93 AM 2.60 (H) 0.44 - 1.00 mg/dL Final  81/09/7508 25:85 AM 2.79 (H) 0.44 - 1.00  mg/dL Final  27/78/2423 53:61 AM 2.97 (H) 0.44 - 1.00 mg/dL Final  44/31/5400 86:76 AM 2.78 (H) 0.44 - 1.00 mg/dL Final  19/50/9326 71:24 AM 2.41 (H) 0.44 - 1.00 mg/dL Final  58/05/9832 82:50 AM 2.16 (H) 0.44 - 1.00 mg/dL Final  53/97/6734 19:37 AM 1.87 (H) 0.44 - 1.00 mg/dL Final   Creat  Date/Time Value Ref Range Status  02/01/2023 01:56 PM 1.70 (H) 0.60 - 0.95 mg/dL Final  90/24/0973 53:29 AM 1.24 (H) 0.60 - 0.88 mg/dL Final  92/42/6834 19:62 PM 0.97 (H) 0.60 - 0.88 mg/dL Final  22/97/9892 11:94 AM 1.24 (H) 0.60 - 0.88 mg/dL Final  17/40/8144 81:85 AM 1.24 (H) 0.60 - 0.88 mg/dL Final  63/14/9702 63:78 AM 1.11 (H) 0.60 - 0.88 mg/dL Final  58/85/0277 41:28 AM 1.19 (H) 0.60 - 0.88 mg/dL Final  78/67/6720 94:70 AM 1.18 (H) 0.60 - 0.88 mg/dL Final  96/28/3662 94:76 AM 1.24 (H) 0.60 - 0.88 mg/dL Final  54/65/0354 65:68 AM 1.15 (H) 0.60 - 0.88 mg/dL Final  12/75/1700 17:49 AM 1.53 (H) 0.60 - 0.88 mg/dL Final  44/96/7591 63:84 AM 1.48 (H) 0.60 - 0.88 mg/dL Final  66/59/9357 01:77 AM 1.27 (H) 0.60 - 0.88 mg/dL Final  93/90/3009 23:30 AM 1.48 (H) 0.60 - 0.88 mg/dL Final  07/62/2633 35:45 AM 1.50 (H) 0.60 - 0.93 mg/dL Final  62/56/3893 73:42 AM 1.16 (H) 0.60 - 0.93 mg/dL Final  87/68/1157 26:20 AM 1.08 0.50 - 1.10 mg/dL Final  35/59/7416 38:45 PM 0.98 0.50 - 1.10 mg/dL Final  36/46/8032 12:24 PM 0.98 0.50 - 1.10 mg/dL Final  82/50/0370 48:88 PM 1.26 (H) 0.50 - 1.10 mg/dL Final  91/69/4503 88:82 AM 0.98 0.50 - 1.10 mg/dL Final  80/11/4915 91:50 AM 1.01 0.50 - 1.10 mg/dL Final  56/97/9480 16:55 AM 1.11 (H) 0.50 - 1.10 mg/dL Final  37/48/2707 86:75 AM 1.18 (H) 0.50 - 1.10 mg/dL Final  44/92/0100 71:21 AM 1.31 (H) 0.50 - 1.10 mg/dL Final  97/58/8325 49:82 AM 1.28 (H) 0.50 - 1.10 mg/dL Final  64/15/8309 40:76 PM 1.26 (H) 0.50 - 1.10 mg/dL Final  80/88/1103 15:94 PM 1.12 (H) 0.50 - 1.10 mg/dL Final  58/59/2924 46:28 PM 1.31 (H) 0.50 - 1.10 mg/dL Final  63/81/7711 65:79 PM 1.13 (H) 0.50 - 1.10  mg/dL Final  03/83/3383 29:19 AM 0.89 0.40 - 1.20 mg/dL Final  16/60/6004 59:97 PM 0.97 0.40 - 1.20 mg/dL Final    PMH:   Past Medical History:  Diagnosis Date   Allergy    Anxiety    ANXIETY DISORDER, GENERALIZED 07/15/2007   Qualifier: Diagnosis of  By: Michaell Adolph  Arthritis    Cancer (HCC) 2009   breast, carcinoma in situ left   Carcinoma in situ of breast 05/21/2008   Qualifier: Diagnosis of  By: Rodolph Clap MD, Margaret  Diagnosed in 2009, completed 5 year course of tamoxifen, no evidence of recurrence    Carotid stenosis    11/16/2005  mild plaque formation and stenosis proximal right ECA   Cataract    Complication of anesthesia    Coronary artery disease    cardiac catheterization on 03/20/2006  LAD mid 40% stenosis, left circumflex mild 40% stenosis, RCA mid-vessel 40% to 50% lesion   EF 60%   Diabetes mellitus    GERD (gastroesophageal reflux disease)    Hernia, inguinal    left   Hyperglycemia    Hypertension    Insomnia 11/16/2011   Low blood potassium    Non-insulin  dependent type 2 diabetes mellitus (HCC)    Osteoporosis    Shortness of breath    2D Echocardiogram 01/26/2009   EF of greater than 55%, mild MR, mild TR, normal ventricular function   Thickened endometrium 10/26/2017   Noted by gyne in 2017, missed 6 month follow up, referred in 09/2017   Ventricular tachycardia, non-sustained (HCC)    developed during stress test 02/08/2006, spontaneously aborted, mild reversible apical defect    PSH:   Past Surgical History:  Procedure Laterality Date   BOWEL RESECTION Left 07/19/2023   Procedure: SMALL BOWEL RESECTION;  Surgeon: Awilda Bogus, MD;  Location: [facility];  Service: General;  Laterality: Left;   BREAST LUMPECTOMY Left 2009   Left breast 2009   CATARACT EXTRACTION W/PHACO Left 10/28/2014   Procedure: PHACO EMULSION CATARACT EXTRACTION WITH INTRAOCULAR LENS IMPLANT LEFT EYE (IOC);  Surgeon: Ben Bracken, MD;  Location: [facility];  Service:  Ophthalmology;  Laterality: Left;   COLONOSCOPY     cyst removed from left foot     INGUINAL HERNIA REPAIR Left 07/19/2023   Procedure: HERNIA REPAIR FEMORAL INCARCERATED;  Surgeon: Awilda Bogus, MD;  Location: [facility];  Service: General;  Laterality: Left;   REFRACTIVE SURGERY Left     Allergies:  Allergies  Allergen Reactions   Farxiga [Dapagliflozin] Other (See Comments)    DKA   Metformin  And Related Diarrhea    Lost appetite and weight    Benadryl [Diphenhydramine Hcl] Hypertension   Citalopram Other (See Comments)    Unknown    Tramadol  Other (See Comments)    Felt light headed and dizzy   Amlodipine  Swelling   Crestor  [Rosuvastatin ] Swelling and Other (See Comments)    "Feet swelling" Makes her feel weak. Pt takes 5mg  at home    Medications:   Prior to Admission medications   Medication Sig Start Date End Date Taking? Authorizing Provider  albuterol  (VENTOLIN  HFA) 108 (90 Base) MCG/ACT inhaler Inhale 2 puffs into the lungs every 2 (two) hours as needed for wheezing or shortness of breath. 02/01/24  Yes Emokpae, Courage, MD  amLODipine  (NORVASC ) 10 MG tablet Take 1 tablet (10 mg total) by mouth daily. 02/02/24  Yes Colin Dawley, MD  carvedilol  (COREG ) 12.5 MG tablet Take 1 tablet (12.5 mg total) by mouth 2 (two) times daily with a meal. 02/01/24  Yes Emokpae, Courage, MD  cloNIDine  (CATAPRES ) 0.3 MG tablet Take 1 tablet (0.3 mg total) by mouth 2 (two) times daily. 02/01/24  Yes Emokpae, Courage, MD  hydrALAZINE  (APRESOLINE ) 100 MG tablet Take 1 tablet (100 mg total) by mouth 3 (three) times daily. 02/01/24  Yes Emokpae, Courage, MD  insulin  lispro (HUMALOG  KWIKPEN) 100 UNIT/ML KwikPen Inject 0-12 Units into the skin 3 (three) times daily. Short acting Humalog  insulin  per sliding scale 0-12 ---:-- Insulin  injection 0-12 Units 0-12 Units Subcutaneous, 3 times daily with meals CBG 70 - 120: 0 unit CBG 121 - 150: 0 unit  CBG 151 - 200: 2 unit CBG 201 - 250: 4 units CBG 251 -  300: 6 units CBG 301 - 350: 8 units  CBG 351 - 400: 10 units  CBG > 400: 12 units 02/01/24  Yes Emokpae, Courage, MD  isosorbide  mononitrate (IMDUR ) 60 MG 24 hr tablet Take 1 tablet (60 mg total) by mouth daily. 02/02/24  Yes Emokpae, Courage, MD  magnesium  oxide (MAG-OX) 400 (240 Mg) MG tablet Take 1 tablet (400 mg total) by mouth daily. 02/02/24  Yes Emokpae, Courage, MD  potassium chloride  (KLOR-CON ) 10 MEQ tablet Take 1 tablet (10 mEq total) by mouth daily. Take While taking Torsemide /Demadex  02/01/24  Yes Emokpae, Courage, MD  RESTASIS  0.05 % ophthalmic emulsion Place 1 drop into both eyes 2 (two) times daily. 11/01/23  Yes [provider]  rosuvastatin  (CRESTOR ) 5 MG tablet TAKE 1 TABLET (5 MG TOTAL) BY MOUTH DAILY. Patient not taking: Reported on 02/04/2024 11/06/23   Towanda Fret, MD  torsemide  60 MG TABS Take 60 mg by mouth 2 (two) times daily. Patient not taking: Reported on 02/04/2024 02/01/24   Colin Dawley, MD    Inpatient medications:  amLODipine   5 mg Oral Daily   heparin   5,000 Units Subcutaneous Q8H   insulin  aspart  0-9 Units Subcutaneous TID WC   insulin  glargine-yfgn  10 Units Subcutaneous QHS    Discontinued Meds:   Medications Discontinued During This Encounter  Medication Reason   Continuous Glucose Sensor (DEXCOM G7 SENSOR) MISC Inpatient Standard   insulin  degludec (TRESIBA  FLEXTOUCH) 100 UNIT/ML FlexTouch Pen Discontinued by provider   torsemide  (DEMADEX ) tablet 60 mg    cloNIDine  (CATAPRES ) tablet 0.3 mg    hydrALAZINE  (APRESOLINE ) tablet 100 mg    isosorbide  mononitrate (IMDUR ) 24 hr tablet 60 mg    amLODipine  (NORVASC ) tablet 10 mg    carvedilol  (COREG ) tablet 12.5 mg     Social History:  reports that she has never smoked. She has never been exposed to tobacco smoke. She has never used smokeless tobacco. She reports that she does not drink alcohol and does not use drugs.  Family History:   Family History  Problem Relation Age of Onset    Hypertension Mother    Hyperlipidemia Mother    Stroke Mother    Urticaria Mother    Cancer Father        pancreatic   Colon cancer Father    Heart disease Brother 40       bypass   Heart disease Brother 28       bypass   Arthritis Other    Asthma Other    Diabetes Other    Colon cancer Paternal Aunt    Esophageal cancer Neg Hx    Stomach cancer Neg Hx    Rectal cancer Neg Hx     Pertinent items are noted in HPI. Weight change:   Intake/Output Summary (Last 24 hours) at 02/05/2024 0912 Last data filed at 02/05/2024 0511 Gross per 24 hour  Intake --  Output 250 ml  Net -250 ml   BP (!) 150/63   Pulse (!) 52   Temp (!) 97.3 F (36.3 C) (Oral)  Resp 20   Ht 5' (1.524 m)   Wt 57.1 kg   SpO2 100%   BMI 24.58 kg/m  Vitals:   02/05/24 0530 02/05/24 0530 02/05/24 0818 02/05/24 0905  BP: (!) 134/59 (!) 134/59 135/65 (!) 150/63  Pulse: (!) 52 (!) 52 (!) 52   Resp: 18 18 20    Temp: (!) 97.3 F (36.3 C) (!) 97.3 F (36.3 C) (!) 97.3 F (36.3 C)   TempSrc:  Oral Oral   SpO2: 100% 100% 100%   Weight:      Height:         General appearance: alert, cooperative, and no distress Head: Normocephalic, without obvious abnormality, atraumatic Resp: rhonchi bibasilar Cardio: no rub and bradycardic GI: soft, non-tender; bowel sounds normal; no masses,  no organomegaly Extremities: edema 1-2 + bilateral lower extremity edema up to presacrum  Labs: Basic Metabolic Panel: Recent Labs  Lab 01/30/24 0510 01/31/24 0446 02/04/24 1653 02/04/24 2121  NA 131* 132* 124* 125*  K 4.4 4.1 4.3 4.4  CL 92* 94* 86* 89*  CO2 26 29 26 23   GLUCOSE 219* 68* 156* 173*  BUN 88* 93* 111* 109*  CREATININE 3.58* 3.50* 4.17* 3.90*  ALBUMIN   --  2.7*  --  2.9*  CALCIUM  9.0 8.9 8.8* 8.6*  PHOS  --  5.5*  --   --    Liver Function Tests: Recent Labs  Lab 01/31/24 0446 02/04/24 2121  AST  --  21  ALT  --  14  ALKPHOS  --  59  BILITOT  --  0.5  PROT  --  5.0*  ALBUMIN  2.7* 2.9*    No results for input(s): "LIPASE", "AMYLASE" in the last 168 hours. No results for input(s): "AMMONIA" in the last 168 hours. CBC: Recent Labs  Lab 02/04/24 1653 02/05/24 0517  WBC 4.3 4.0  HGB 7.9* 8.0*  HCT 23.0* 23.4*  MCV 92.7 92.1  PLT 212 202   PT/INR: @LABRCNTIP (inr:5) Cardiac Enzymes: )No results for input(s): "CKTOTAL", "CKMB", "CKMBINDEX", "TROPONINI" in the last 168 hours. CBG: Recent Labs  Lab 02/01/24 0750 02/01/24 0955 02/01/24 1216 02/04/24 2250 02/05/24 0739  GLUCAP 243* 325* 328* 283* 120*    Iron Studies: No results for input(s): "IRON", "TIBC", "TRANSFERRIN", "FERRITIN" in the last 168 hours.  Xrays/Other Studies: DG Chest Portable 1 View Result Date: 02/04/2024 CLINICAL DATA:  Shortness of breath EXAM: PORTABLE CHEST 1 VIEW COMPARISON:  01/31/2024 FINDINGS: Cardiomegaly. Aortic atherosclerosis. Small left pleural effusion with left base atelectasis. Right lung clear. No edema. No acute bony abnormality. IMPRESSION: Cardiomegaly. Small left pleural effusion with left base atelectasis. Electronically Signed   By: Janeece Mechanic M.D.   On: 02/04/2024 18:19     Assessment/Plan:   AKI/CKD stage III - in setting of acute on chronic HFpEF consistent with recurrent cardiorenal syndrome.  Has significant volume overload and agree with resuming IV lasix  and follow UOP and daily Scr.  No urgent indication for dialysis, which she is adamant that she would not want.    Avoid nephrotoxic medications including NSAIDs and iodinated intravenous contrast exposure unless the latter is absolutely indicated.   Preferred narcotic agents for pain control are hydromorphone, fentanyl , and methadone. Morphine  should not be used.  Avoid Baclofen and avoid oral sodium phosphate  and magnesium  citrate based laxatives / bowel preps.  Continue strict Input and Output monitoring. Will monitor the patient closely with you and intervene or adjust therapy as indicated by changes in  clinical status/labs  Acute  on chronic HFpEF - due to lack of torsemide  after discharge.  Did not respond to po torsemide  yesterday.  Will start IV lasix  80 mg tid and follow response.  For ECHO per primary svc.  Anemia of CKD stage III - TSAT 17% and low iron.  Will dose with IV iron and follow but will likely need to initiate ESA therapy. HTN - stable Hypervolemic hyponatremia - follow with diuresis.  Currently asymptomatic.  DM type 2 - per primary   Drexel Gentles A Thanya Cegielski 02/05/2024, 9:12 AM

## 2024-02-05 NOTE — Progress Notes (Signed)
   02/05/24 0800  ReDS Vest / Clip  Station Marker A  Ruler Value 31  ReDS Value Range < 36  ReDS Actual Value 39

## 2024-02-05 NOTE — Plan of Care (Signed)
  Problem: Acute Rehab PT Goals(only PT should resolve) Goal: Pt Will Go Supine/Side To Sit Outcome: Progressing Flowsheets (Taken 02/05/2024 1346) Pt will go Supine/Side to Sit: with supervision Goal: Patient Will Transfer Sit To/From Stand Outcome: Progressing Flowsheets (Taken 02/05/2024 1346) Patient will transfer sit to/from stand:  with supervision  with contact guard assist Goal: Pt Will Transfer Bed To Chair/Chair To Bed Outcome: Progressing Flowsheets (Taken 02/05/2024 1346) Pt will Transfer Bed to Chair/Chair to Bed: with contact guard assist Goal: Pt Will Ambulate Outcome: Progressing Flowsheets (Taken 02/05/2024 1346) Pt will Ambulate:  50 feet  with contact guard assist  with minimal assist  with rolling walker   1:47 PM, 02/05/24 Walton Guppy, MPT Physical Therapist with [facility] [phone] office [phone] mobile phone

## 2024-02-05 NOTE — Plan of Care (Signed)
  Problem: Education: Goal: Knowledge of General Education information will improve Description: Including pain rating scale, medication(s)/side effects and non-pharmacologic comfort measures Outcome: Progressing   Problem: Health Behavior/Discharge Planning: Goal: Ability to manage health-related needs will improve Outcome: Progressing   Problem: Clinical Measurements: Goal: Respiratory complications will improve Outcome: Progressing Goal: Cardiovascular complication will be avoided Outcome: Progressing   Problem: Activity: Goal: Risk for activity intolerance will decrease Outcome: Progressing   Problem: Fluid Volume: Goal: Ability to maintain a balanced intake and output will improve Outcome: Progressing   Problem: Metabolic: Goal: Ability to maintain appropriate glucose levels will improve Outcome: Progressing   Problem: Tissue Perfusion: Goal: Adequacy of tissue perfusion will improve Outcome: Progressing

## 2024-02-06 DIAGNOSIS — I160 Hypertensive urgency: Secondary | ICD-10-CM

## 2024-02-06 DIAGNOSIS — I5033 Acute on chronic diastolic (congestive) heart failure: Secondary | ICD-10-CM | POA: Diagnosis not present

## 2024-02-06 DIAGNOSIS — I10 Essential (primary) hypertension: Secondary | ICD-10-CM | POA: Diagnosis not present

## 2024-02-06 DIAGNOSIS — N184 Chronic kidney disease, stage 4 (severe): Secondary | ICD-10-CM | POA: Diagnosis not present

## 2024-02-06 DIAGNOSIS — Z515 Encounter for palliative care: Secondary | ICD-10-CM

## 2024-02-06 DIAGNOSIS — T68XXXA Hypothermia, initial encounter: Secondary | ICD-10-CM

## 2024-02-06 DIAGNOSIS — N179 Acute kidney failure, unspecified: Secondary | ICD-10-CM | POA: Diagnosis not present

## 2024-02-06 LAB — RENAL FUNCTION PANEL
Albumin: 2.8 g/dL — ABNORMAL LOW (ref 3.5–5.0)
Anion gap: 15 (ref 5–15)
BUN: 110 mg/dL — ABNORMAL HIGH (ref 8–23)
CO2: 20 mmol/L — ABNORMAL LOW (ref 22–32)
Calcium: 8.2 mg/dL — ABNORMAL LOW (ref 8.9–10.3)
Chloride: 92 mmol/L — ABNORMAL LOW (ref 98–111)
Creatinine, Ser: 4.06 mg/dL — ABNORMAL HIGH (ref 0.44–1.00)
GFR, Estimated: 10 mL/min — ABNORMAL LOW (ref >60)
Glucose, Bld: 131 mg/dL — ABNORMAL HIGH (ref 70–99)
Phosphorus: 6.1 mg/dL — ABNORMAL HIGH (ref 2.5–4.6)
Potassium: 4.3 mmol/L (ref 3.5–5.1)
Sodium: 127 mmol/L — ABNORMAL LOW (ref 135–145)

## 2024-02-06 LAB — GLUCOSE, CAPILLARY
Glucose-Capillary: 63 mg/dL — ABNORMAL LOW (ref 70–99)
Glucose-Capillary: 103 mg/dL — ABNORMAL HIGH (ref 70–99)
Glucose-Capillary: 266 mg/dL — ABNORMAL HIGH (ref 70–99)
Glucose-Capillary: 311 mg/dL — ABNORMAL HIGH (ref 70–99)
Glucose-Capillary: 253 mg/dL — ABNORMAL HIGH (ref 70–99)

## 2024-02-06 LAB — TSH: TSH: 3.081 u[IU]/mL (ref 0.350–4.500)

## 2024-02-06 LAB — CBC
HCT: 23.8 % — ABNORMAL LOW (ref 36.0–46.0)
Hemoglobin: 8 g/dL — ABNORMAL LOW (ref 12.0–15.0)
MCH: 31.1 pg (ref 26.0–34.0)
MCHC: 33.6 g/dL (ref 30.0–36.0)
MCV: 92.6 fL (ref 80.0–100.0)
Platelets: 202 10*3/uL (ref 150–400)
RBC: 2.57 MIL/uL — ABNORMAL LOW (ref 3.87–5.11)
RDW: 13.3 % (ref 11.5–15.5)
WBC: 3.9 10*3/uL — ABNORMAL LOW (ref 4.0–10.5)
nRBC: 0 % (ref 0.0–0.2)

## 2024-02-06 LAB — MRSA NEXT GEN BY PCR, NASAL: MRSA by PCR Next Gen: NOT DETECTED

## 2024-02-06 MED ORDER — INSULIN GLARGINE-YFGN 100 UNIT/ML ~~LOC~~ SOLN
5.0000 [IU] | Freq: Every day | SUBCUTANEOUS | Status: DC
  Administered 2024-02-06 – 2024-02-08 (×3): 5 [IU] via SUBCUTANEOUS
  Filled 2024-02-06 (×4): qty 0.05

## 2024-02-06 MED ORDER — INSULIN ASPART 100 UNIT/ML IJ SOLN
9.0000 [IU] | Freq: Once | INTRAMUSCULAR | Status: AC
  Administered 2024-02-06: 9 [IU] via SUBCUTANEOUS

## 2024-02-06 MED ORDER — FOLIC ACID 1 MG PO TABS
1.0000 mg | ORAL_TABLET | Freq: Every day | ORAL | Status: DC
  Administered 2024-02-06 – 2024-02-13 (×8): 1 mg via ORAL
  Filled 2024-02-06 (×8): qty 1

## 2024-02-06 MED ORDER — CLONIDINE HCL 0.2 MG PO TABS
0.3000 mg | ORAL_TABLET | Freq: Two times a day (BID) | ORAL | Status: DC
  Administered 2024-02-06 – 2024-02-08 (×5): 0.3 mg via ORAL
  Filled 2024-02-06 (×5): qty 1

## 2024-02-06 MED ORDER — FUROSEMIDE 10 MG/ML IJ SOLN
120.0000 mg | Freq: Four times a day (QID) | INTRAVENOUS | Status: DC
  Administered 2024-02-06 – 2024-02-09 (×12): 120 mg via INTRAVENOUS
  Filled 2024-02-06 (×9): qty 12
  Filled 2024-02-06 (×2): qty 120
  Filled 2024-02-06 (×3): qty 12
  Filled 2024-02-06 (×2): qty 120
  Filled 2024-02-06: qty 12

## 2024-02-06 MED ORDER — INSULIN ASPART 100 UNIT/ML IJ SOLN
2.0000 [IU] | Freq: Three times a day (TID) | INTRAMUSCULAR | Status: DC
  Administered 2024-02-07 (×2): 2 [IU] via SUBCUTANEOUS

## 2024-02-06 MED ORDER — HYDRALAZINE HCL 20 MG/ML IJ SOLN
10.0000 mg | Freq: Four times a day (QID) | INTRAMUSCULAR | Status: DC | PRN
  Administered 2024-02-06 – 2024-02-13 (×16): 10 mg via INTRAVENOUS
  Filled 2024-02-06 (×16): qty 1

## 2024-02-06 NOTE — Plan of Care (Signed)
 Problem: Education: Goal: Knowledge of General Education information will improve Description: Including pain rating scale, medication(s)/side effects and non-pharmacologic comfort measures 02/06/2024 0457 by Winnie Haver, RN Outcome: Progressing 02/06/2024 0457 by Winnie Haver, RN Outcome: Progressing   Problem: Health Behavior/Discharge Planning: Goal: Ability to manage health-related needs will improve 02/06/2024 0457 by Winnie Haver, RN Outcome: Progressing 02/06/2024 0457 by Winnie Haver, RN Outcome: Progressing   Problem: Clinical Measurements: Goal: Ability to maintain clinical measurements within normal limits will improve 02/06/2024 0457 by Winnie Haver, RN Outcome: Progressing 02/06/2024 0457 by Winnie Haver, RN Outcome: Progressing Goal: Will remain free from infection 02/06/2024 0457 by Winnie Haver, RN Outcome: Progressing 02/06/2024 0457 by Winnie Haver, RN Outcome: Progressing Goal: Diagnostic test results will improve 02/06/2024 0457 by Winnie Haver, RN Outcome: Progressing 02/06/2024 0457 by Winnie Haver, RN Outcome: Progressing Goal: Respiratory complications will improve 02/06/2024 0457 by Winnie Haver, RN Outcome: Progressing 02/06/2024 0457 by Winnie Haver, RN Outcome: Progressing Goal: Cardiovascular complication will be avoided 02/06/2024 0457 by Winnie Haver, RN Outcome: Progressing 02/06/2024 0457 by Winnie Haver, RN Outcome: Progressing   Problem: Activity: Goal: Risk for activity intolerance will decrease 02/06/2024 0457 by Winnie Haver, RN Outcome: Progressing 02/06/2024 0457 by Winnie Haver, RN Outcome: Progressing   Problem: Nutrition: Goal: Adequate nutrition will be maintained 02/06/2024 0457 by Winnie Haver, RN Outcome: Progressing 02/06/2024 0457 by Winnie Haver, RN Outcome: Progressing   Problem: Coping: Goal: Level of anxiety will decrease 02/06/2024 0457 by Winnie Haver, RN Outcome:  Progressing 02/06/2024 0457 by Winnie Haver, RN Outcome: Progressing   Problem: Elimination: Goal: Will not experience complications related to bowel motility 02/06/2024 0457 by Winnie Haver, RN Outcome: Progressing 02/06/2024 0457 by Winnie Haver, RN Outcome: Progressing Goal: Will not experience complications related to urinary retention 02/06/2024 0457 by Winnie Haver, RN Outcome: Progressing 02/06/2024 0457 by Winnie Haver, RN Outcome: Progressing   Problem: Pain Managment: Goal: General experience of comfort will improve and/or be controlled 02/06/2024 0457 by Winnie Haver, RN Outcome: Progressing 02/06/2024 0457 by Winnie Haver, RN Outcome: Progressing   Problem: Safety: Goal: Ability to remain free from injury will improve 02/06/2024 0457 by Winnie Haver, RN Outcome: Progressing 02/06/2024 0457 by Winnie Haver, RN Outcome: Progressing   Problem: Education: Goal: Ability to demonstrate management of disease process will improve 02/06/2024 0457 by Winnie Haver, RN Outcome: Progressing 02/06/2024 0457 by Winnie Haver, RN Outcome: Progressing Goal: Ability to verbalize understanding of medication therapies will improve 02/06/2024 0457 by Winnie Haver, RN Outcome: Progressing 02/06/2024 0457 by Winnie Haver, RN Outcome: Progressing Goal: Individualized Educational Video(s) 02/06/2024 0457 by Winnie Haver, RN Outcome: Progressing 02/06/2024 0457 by Winnie Haver, RN Outcome: Progressing   Problem: Activity: Goal: Capacity to carry out activities will improve 02/06/2024 0457 by Winnie Haver, RN Outcome: Progressing 02/06/2024 0457 by Winnie Haver, RN Outcome: Progressing   Problem: Cardiac: Goal: Ability to achieve and maintain adequate cardiopulmonary perfusion will improve 02/06/2024 0457 by Winnie Haver, RN Outcome: Progressing 02/06/2024 0457 by Winnie Haver, RN Outcome: Progressing   Problem: Education: Goal:  Ability to demonstrate management of disease process will improve 02/06/2024 0457 by Winnie Haver, RN Outcome: Progressing 02/06/2024 0457 by Winnie Haver, RN Outcome: Progressing Goal: Ability to verbalize understanding of medication therapies will improve 02/06/2024 0457 by Authur Boards,  Christeen Cousin, RN Outcome: Progressing 02/06/2024 0457 by Winnie Haver, RN Outcome: Progressing Goal: Individualized Educational Video(s) 02/06/2024 0457 by Winnie Haver, RN Outcome: Progressing 02/06/2024 0457 by Winnie Haver, RN Outcome: Progressing   Problem: Activity: Goal: Capacity to carry out activities will improve 02/06/2024 0457 by Winnie Haver, RN Outcome: Progressing 02/06/2024 0457 by Winnie Haver, RN Outcome: Progressing   Problem: Cardiac: Goal: Ability to achieve and maintain adequate cardiopulmonary perfusion will improve 02/06/2024 0457 by Winnie Haver, RN Outcome: Progressing 02/06/2024 0457 by Winnie Haver, RN Outcome: Progressing   Problem: Education: Goal: Ability to describe self-care measures that may prevent or decrease complications (Diabetes Survival Skills Education) will improve 02/06/2024 0457 by Winnie Haver, RN Outcome: Progressing 02/06/2024 0457 by Winnie Haver, RN Outcome: Progressing Goal: Individualized Educational Video(s) 02/06/2024 0457 by Winnie Haver, RN Outcome: Progressing 02/06/2024 0457 by Winnie Haver, RN Outcome: Progressing   Problem: Coping: Goal: Ability to adjust to condition or change in health will improve 02/06/2024 0457 by Winnie Haver, RN Outcome: Progressing 02/06/2024 0457 by Winnie Haver, RN Outcome: Progressing   Problem: Health Behavior/Discharge Planning: Goal: Ability to identify and utilize available resources and services will improve 02/06/2024 0457 by Winnie Haver, RN Outcome: Progressing 02/06/2024 0457 by Winnie Haver, RN Outcome: Progressing Goal: Ability to manage health-related needs  will improve 02/06/2024 0457 by Winnie Haver, RN Outcome: Progressing 02/06/2024 0457 by Winnie Haver, RN Outcome: Progressing   Problem: Fluid Volume: Goal: Ability to maintain a balanced intake and output will improve 02/06/2024 0457 by Winnie Haver, RN Outcome: Progressing 02/06/2024 0457 by Winnie Haver, RN Outcome: Progressing   Problem: Metabolic: Goal: Ability to maintain appropriate glucose levels will improve 02/06/2024 0457 by Winnie Haver, RN Outcome: Progressing 02/06/2024 0457 by Winnie Haver, RN Outcome: Progressing   Problem: Nutritional: Goal: Maintenance of adequate nutrition will improve 02/06/2024 0457 by Winnie Haver, RN Outcome: Progressing 02/06/2024 0457 by Winnie Haver, RN Outcome: Progressing Goal: Progress toward achieving an optimal weight will improve 02/06/2024 0457 by Winnie Haver, RN Outcome: Progressing 02/06/2024 0457 by Winnie Haver, RN Outcome: Progressing   Problem: Skin Integrity: Goal: Risk for impaired skin integrity will decrease 02/06/2024 0457 by Winnie Haver, RN Outcome: Progressing 02/06/2024 0457 by Winnie Haver, RN Outcome: Progressing   Problem: Tissue Perfusion: Goal: Adequacy of tissue perfusion will improve 02/06/2024 0457 by Winnie Haver, RN Outcome: Progressing 02/06/2024 0457 by Winnie Haver, RN Outcome: Progressing

## 2024-02-06 NOTE — Progress Notes (Signed)
 Daily Progress Note   Patient Name: Emily Jennings       Date: 02/06/2024 DOB: 1934-03-30  Age: 89 y.o. MRN#: [id number] Attending Physician: Wynetta Heckle, MD Primary Care Physician: Towanda Fret, MD Admit Date: 02/04/2024  Reason for Consultation/Follow-up: Establishing goals of care  Patient Profile/HPI:    89 y.o. female  with past medical history of CHF, DM2, CAD, CKD IV, HTN, recent admission 5/6-5/16 for hyperglycemia and acute kidney injury admitted on 02/04/2024 with CHF exacerbation with severe SOB due to not being able to fill torsemide  and acute kidney injury- possibly cardiorenal. She was changed to torsemide  during recent hospitalization. Palliative medicine consulted for goals of care.     Subjective: Chart reviewed including labs, progress notes, imaging from this and previous encounters.  Labs reviewed- today Cr up a little at 4.06. Nephrology note reviewed- continuing to monitor.  ECHO reviewed- L ventricle with hyperdynamic function- EF 70-75%, grade II diastolic dysfunction.  Patient had hypothermia and hypertension overnight and was moved to ICU.  On eval- Emily Jennings is awake and alert, in good spirits, eating. She is able to recall discussion with me yesterday- she has no questions or complaints.   Review of Systems  All other systems reviewed and are negative.    Physical Exam Vitals and nursing note reviewed.  Constitutional:      Comments: frail  Cardiovascular:     Rate and Rhythm: Normal rate.  Pulmonary:     Effort: Pulmonary effort is normal.  Neurological:     Mental Status: She is alert and oriented to person, place, and time.  Psychiatric:        Mood and Affect: Mood normal.             Vital Signs: BP (!) 193/62   Pulse 71   Temp (!) 96.7 F  (35.9 C) (Rectal)   Resp 16   Ht 5' (1.524 m)   Wt 61.5 kg   SpO2 100%   BMI 26.48 kg/m  SpO2: SpO2: 100 % O2 Device: O2 Device: Room Air O2 Flow Rate:    Intake/output summary:  Intake/Output Summary (Last 24 hours) at 02/06/2024 1407 Last data filed at 02/06/2024 1205 Gross per 24 hour  Intake --  Output 300 ml  Net -300 ml   LBM: Last  BM Date : 02/04/24 Baseline Weight: Weight: 59.4 kg Most recent weight: Weight: 61.5 kg       Palliative Assessment/Data: PPS: 50%      Patient Active Problem List   Diagnosis Date Noted   Acute diastolic heart failure (HCC) 02/04/2024   Anemia due to chronic kidney disease 02/04/2024   Type 2 diabetes mellitus with hyperglycemia (HCC) 01/22/2024   Insulin  long-term use (HCC) 12/05/2023   Type 2 diabetes mellitus with other specified complication (HCC) 09/25/2023   Acute kidney injury superimposed on stage 4 chronic kidney disease (HCC) 08/14/2023   Small bowel obstruction (HCC) 07/19/2023   Ischemic bowel disease (HCC) 07/19/2023   Incarcerated left inguinal hernia 07/18/2023   Acute kidney injury superimposed on chronic kidney disease (HCC) 07/18/2023   Hypoglycemia 07/18/2023   Uncontrolled type 2 diabetes mellitus with hypoglycemia, with long-term current use of insulin  (HCC) 07/04/2023   Dysphagia 07/04/2023   HCAP (healthcare-associated pneumonia) 07/03/2023   Essential hypertension 06/15/2023   Acute on chronic renal failure (HCC) 06/02/2023   Acute diastolic CHF (congestive heart failure) (HCC) 06/01/2023   Cerebellar edema (HCC) 05/23/2023   Mixed hyperlipidemia 05/23/2023   DNR (do not resuscitate) 05/17/2023   Vocal cord dysfunction 05/17/2023   DNR (do not resuscitate) discussion 05/17/2023   Goals of care, counseling/discussion 05/17/2023   Right cerebellar intraparenchymal hemorrhage due to hypertension 05/16/2023   Unsteady gait when walking 04/03/2023   Hospital discharge follow-up 03/06/2023   Bilateral kidney  stones 03/06/2023   Hip swelling, right 02/27/2023   DKA (diabetic ketoacidosis) (HCC) 02/17/2023   High anion gap metabolic acidosis 02/17/2023   Pseudohyponatremia 02/17/2023   Hypoalbuminemia due to protein-calorie malnutrition (HCC) 02/17/2023   Dehydration 02/17/2023   Hip pain 02/15/2023   Encounter for Medicare annual examination with abnormal findings 10/16/2022   Vitamin D  deficiency 10/16/2022   Acute exacerbation of CHF (congestive heart failure) (HCC) 09/13/2022   Acute on chronic congestive heart failure with left ventricular diastolic dysfunction (HCC) 09/13/2022   Chronic kidney disease (CKD) stage G3b/A2, moderately decreased glomerular filtration rate (GFR) between 30-44 mL/min/1.73 square meter and albuminuria creatinine ratio between 30-299 mg/g (HCC) 09/13/2022   Mild aortic valve stenosis 09/13/2022   Dyspnea on exertion 09/05/2022   CHF (congestive heart failure) (HCC) 09/05/2022   Acute on chronic diastolic (congestive) heart failure (HCC) 09/05/2022   Hyperkalemia 04/09/2022   Hyperglycemic crisis due to diabetes mellitus (HCC) 03/25/2022   Hyponatremia 03/25/2022   Acute metabolic encephalopathy 03/22/2022   Hypothermia 03/22/2022   Heart murmur 03/22/2022   Bilateral lower extremity edema 03/22/2022   Syncope and collapse 01/13/2021   Lip swelling 10/30/2020   CKD stage 4 due to type 2 diabetes mellitus (HCC) 10/30/2020   Tubular adenoma of colon 10/26/2017   Diabetes (HCC) 07/27/2016   Osteoporosis 07/28/2014   Diabetic hypertension-nephrosis syndrome (HCC) 03/23/2014   Type 2 diabetes mellitus with stage 3a chronic kidney disease, with long-term current use of insulin  (HCC) 09/29/2013   Allergic rhinitis 01/27/2013   GERD (gastroesophageal reflux disease) 01/12/2013   CVD (cardiovascular disease) 07/17/2011   Hypokalemia 08/31/2008   Hyperlipidemia LDL goal <100 07/15/2007   Resistant hypertension 07/15/2007    Palliative Care Assessment & Plan     Assessment/Recommendations/Plan  Continue current interventions- full scope- limit set at DNR   Code Status:   Code Status: Limited: Do not attempt resuscitation (DNR) -DNR-LIMITED -Do Not Intubate/DNI    Prognosis:  Unable to determine  Discharge Planning: To Be Determined  Thank you for allowing the Palliative Medicine Team to assist in the care of this patient.  Total time:  Prolonged billing:  Time includes:   Preparing to see the patient (e.g., review of tests) Obtaining and/or reviewing separately obtained history Performing a medically necessary appropriate examination and/or evaluation Counseling and educating the patient/family/caregiver Ordering medications, tests, or procedures Referring and communicating with other health care professionals (when not reported separately) Documenting clinical information in the electronic or other health record Independently interpreting results (not reported separately) and communicating results to the patient/family/caregiver Care coordination (not reported separately) Clinical documentation  Micki Alas, AGNP-C Palliative Medicine   Please contact Palliative Medicine Team phone at [phone] for questions and concerns.

## 2024-02-06 NOTE — Progress Notes (Signed)
 Critical note  RN called due to the patient presenting with hypothermia -94.92F.  Patient was transferred to stepdown unit, Bair hugger was provided with improvement in temperature: 97.52F TSH will be checked. Patient was in no acute distress at bedside. Blood pressure was also noted to be elevated.  IV hydralazine  10 mg every 6 hours as needed for SBP > 170 was started with a transitory improvement in BP, however, BP has gone back up to 201/91.  Pete hydralazine  will be given and home amlodipine  will also be given with hope that this will control the BP.  Otherwise, IV nicardipine  drip may be considered.  Assessment and plan Hypothermia - Resolved  Hypertensive urgency Essential hypertension Continue IV hydralazine  10 mg every 6 hours as needed Continue amlodipine  per home regimen Consider IV nicardipine  drip if patient does not respond to the above meds   Critical time: 31 minutes   Critical care personally provided  managing the patient due to high probability of clinically significant and life threatening deterioration. This critical care time included obtaining a history; examining the patient, pulse oximetry; ordering and review of studies; arranging urgent treatment with development of a management plan; evaluation of patient's response of treatment; frequent reassessment; and discussions with other providers.  This critical care time was performed to assess and manage the high probability of imminent and life threatening deterioration that could result in multi-organ failure.  Refer to admission H&P and progress notes for details regarding the care of this patient.

## 2024-02-06 NOTE — Progress Notes (Signed)
 [facility] PROGRESS NOTE  Emily Jennings (DOB: Jan 01, 1934) JXB:[id number] PCP: Towanda Fret, MD  Brief Narrative: Emily Jennings is an 89 y.o. female with a history of HFpEF, stage IV CKD, CAD who presented to the ED on 02/04/2024 with dyspnea, leg swelling, orthopnea worsening since recent discharge as she was unable to obtain torsemide  prescribed at that time. In the ED she had evidence of interstitial pulmonary edema and volume overload. Nephrology was consulted for assistance with diuresing the patient and she was admitted for acute on chronic HFpEF with cardiorenal syndrome. Transferred to SDU 5/21 due to hypothermia in the absence of infectious nidus. Palliative care is following and the patient has set limits on her care declining dialysis and she is DNR.  Subjective: No complaints, didn't get much sleep last night but never really felt ill. Just cold. Has made urine but none documented and doesn't feel it was much more than usual. Still quite swollen.   Objective: BP (!) 193/62   Pulse 71   Temp (!) 96.7 F (35.9 C) (Rectal)   Resp 16   Ht 5' (1.524 m)   Wt 61.5 kg   SpO2 100%   BMI 26.48 kg/m   Gen: Elderly frail female in no acute distress Pulm: Clear, nonlabored  CV: RRR, 2+ pitting edema GI: Soft, NT, ND, +BS  Neuro: Alert and oriented, feeble. No new focal deficits. Ext: Warm, no deformities Skin: No rashes, lesions or ulcers on visualized skin   Assessment & Plan: Acute on chronic HFpEF: Echo this admit stable from previous with hyperdynamic LV, G2DD, mild to moderate AS - Remains quite overloaded with modest UOP and slight Cr bump with lasix , will increase dose per nephrology whose assistance is appreciated. - Palliative care also consulted.    AKI on stage IV CKD:  - Baseline creatinine 3.2-3.5. BUN and Cr remain quite elevated, but no uremic symptoms. Bicarb 20, K wnl. Appreciate nephrology consultation. Pt very clear in her desire to avoid dialysis.     Hypothermia: With no infectious symptoms.  - Check screening blood cultures - Remain in SDU - Monitor off abx for now.  - TSH wnl, check AM cortisol (though severely hypERtensive)  T2DM: HbA1c 7.4%. - Continue basal insulin , decrease dose to 5u with fasting hypoglycemia this AM.  - Postprandial elevations: Add mealtime novolog  to sensitive SSI.  HTN with HTN urgency:  - Continue norvasc  5mg , restart home clonidine  to avoid rebound  - Continue prn hydralazine , avoid abrupt lowering of BP. Anticipate improvement with diuresis.   Hypervolemic hyponatremia:  - Continue efforts at diuresis, monitoring daily.   Anemia of CKD - Anemia panel wnl, though will supp folic acid (low normal at 6.7) - Defer ESA to nephrology.   HLD:  - Statin  Weakness: SNF recommended, though pt and spouse very clearly state she'll be going back home with [facility] support. Continue mobility efforts here.   Wynetta Heckle, MD [facility] [email or web address] 02/06/2024, 5:06 PM

## 2024-02-06 NOTE — Progress Notes (Signed)
 Patient ID: [name], female   DOB: Apr 09, 1934, 89 y.o.   MRN: [id number] S: No new complaints.  She was transferred to ICU overnight due to hypothermia and the development of urgent HTN.  No UOP documented overnight, however pt reports urinating. O:BP (!) 165/47   Pulse 65   Temp 97.7 F (36.5 C) (Rectal)   Resp 15   Ht 5' (1.524 m)   Wt 61.5 kg   SpO2 100%   BMI 26.48 kg/m   Intake/Output Summary (Last 24 hours) at 02/06/2024 6578 Last data filed at 02/05/2024 1308 Gross per 24 hour  Intake 350.39 ml  Output --  Net 350.39 ml   Intake/Output: I/O last 3 completed shifts: In: 350.4 [P.O.:240; IV Piggyback:110.4] Out: 250 [Urine:250]  Intake/Output this shift:  No intake/output data recorded. Weight change: 2.079 kg Gen: NAD CVS: RRR Resp:CTA Abd: +BS, soft, TN/ND Ext: 2+ pretibial and presacral edema  Recent Labs  Lab 01/31/24 0446 02/04/24 1653 02/04/24 2121 02/06/24 0322  NA 132* 124* 125* 127*  K 4.1 4.3 4.4 4.3  CL 94* 86* 89* 92*  CO2 29 26 23  20*  GLUCOSE 68* 156* 173* 131*  BUN 93* 111* 109* 110*  CREATININE 3.50* 4.17* 3.90* 4.06*  ALBUMIN  2.7*  --  2.9* 2.8*  CALCIUM  8.9 8.8* 8.6* 8.2*  PHOS 5.5*  --   --  6.1*  AST  --   --  21  --   ALT  --   --  14  --    Liver Function Tests: Recent Labs  Lab 01/31/24 0446 02/04/24 2121 02/06/24 0322  AST  --  21  --   ALT  --  14  --   ALKPHOS  --  59  --   BILITOT  --  0.5  --   PROT  --  5.0*  --   ALBUMIN  2.7* 2.9* 2.8*   No results for input(s): "LIPASE", "AMYLASE" in the last 168 hours. No results for input(s): "AMMONIA" in the last 168 hours. CBC: Recent Labs  Lab 02/04/24 1653 02/05/24 0517 02/06/24 0322  WBC 4.3 4.0 3.9*  HGB 7.9* 8.0* 8.0*  HCT 23.0* 23.4* 23.8*  MCV 92.7 92.1 92.6  PLT 212 202 202   Cardiac Enzymes: No results for input(s): "CKTOTAL", "CKMB", "CKMBINDEX", "TROPONINI" in the last 168 hours. CBG: Recent Labs  Lab 02/05/24 0739 02/05/24 1112 02/05/24 1645  02/05/24 2103 02/06/24 0746  GLUCAP 120* 189* 248* 243* 63*    Iron Studies:  Recent Labs    02/05/24 0517  IRON 46  TIBC 274  FERRITIN 107   Studies/Results: ECHOCARDIOGRAM COMPLETE Result Date: 02/05/2024    ECHOCARDIOGRAM REPORT   Patient Name:   SHARVI MOONEYHAN Date of Exam: 02/05/2024 Medical Rec #:  [id number]     Height:       60.0 in Accession #:    [id number]    Weight:       125.9 lb Date of Birth:  July 29, 1934      BSA:          1.533 m Patient Age:    89 years      BP:           150/63 mmHg Patient Gender: F             HR:           52 bpm. Exam Location:  [facility] Procedure: 2D Echo, Cardiac Doppler and Color Doppler (Both Spectral  and Color            Flow Doppler were utilized during procedure). Indications:    CHF-Acute Diastolic I50.31  History:        Patient has prior history of Echocardiogram examinations, most                 recent 04/27/2023. CHF, Aortic Valve Disease; Risk                 Factors:Hypertension, Diabetes and Dyslipidemia.  Sonographer:    Denese Finn RCS Referring Phys: [id number] VONDRA BRIMAGE IMPRESSIONS  1. Left ventricular ejection fraction, by estimation, is 70 to 75%. The left ventricle has hyperdynamic function. The left ventricle has no regional wall motion abnormalities. There is moderate asymmetric left ventricular hypertrophy. Left ventricular diastolic parameters are consistent with Grade II diastolic dysfunction (pseudonormalization).  2. Right ventricular systolic function is normal. The right ventricular size is normal. Mildly increased right ventricular wall thickness.  3. Left atrial size was severely dilated.  4. Right atrial size was moderately dilated.  5. Elongated and thickened mitral chordae There is systolic anteror motion of chord into LVOT during systole Similar to findings in echo from August 2024. Aaron Aas The mitral valve is myxomatous. Mild mitral valve regurgitation.  6. AV is thickened, calcified with restricted motion. Peak and mean  gradients through the valve are 46 and 24 mm Hg respectively. AVA (VTI) is 1.15 cm2. Dimensionless indext is 0.45 Ovreall consistent with mild to moderate AS. Aaron Aas The aortic valve is tricuspid. Aortic valve regurgitation is trivial.  7. The inferior vena cava is dilated in size with >50% respiratory variability, suggesting right atrial pressure of 8 mmHg. FINDINGS  Left Ventricle: Left ventricular ejection fraction, by estimation, is 70 to 75%. The left ventricle has hyperdynamic function. The left ventricle has no regional wall motion abnormalities. The left ventricular internal cavity size was normal in size. There is moderate asymmetric left ventricular hypertrophy. Left ventricular diastolic parameters are consistent with Grade II diastolic dysfunction (pseudonormalization). Right Ventricle: The right ventricular size is normal. Mildly increased right ventricular wall thickness. Right ventricular systolic function is normal. Left Atrium: Left atrial size was severely dilated. Right Atrium: Right atrial size was moderately dilated. Pericardium: There is no evidence of pericardial effusion. Mitral Valve: Elongated and thickened mitral chordae There is systolic anteror motion of chord into LVOT during systole Similar to findings in echo from August 2024. The mitral valve is myxomatous. Mild mitral valve regurgitation. MV peak gradient, 10.9 mmHg. The mean mitral valve gradient is 3.0 mmHg. Tricuspid Valve: The tricuspid valve is normal in structure. Tricuspid valve regurgitation is mild. Aortic Valve: AV is thickened, calcified with restricted motion. Peak and mean gradients through the valve are 46 and 24 mm Hg respectively. AVA (VTI) is 1.15 cm2. Dimensionless indext is 0.45 Ovreall consistent with mild to moderate AS. The aortic valve  is tricuspid. Aortic valve regurgitation is trivial. Aortic regurgitation PHT measures 666 msec. Aortic valve mean gradient measures 24.0 mmHg. Aortic valve peak gradient measures  45.4 mmHg. Aortic valve area, by VTI measures 1.15 cm. Pulmonic Valve: The pulmonic valve was thickened with good excursion. Pulmonic valve regurgitation is mild. Aorta: The aortic root is normal in size and structure. Venous: The inferior vena cava is dilated in size with greater than 50% respiratory variability, suggesting right atrial pressure of 8 mmHg. IAS/Shunts: No atrial level shunt detected by color flow Doppler.  LEFT VENTRICLE PLAX 2D LVIDd:  4.40 cm   Diastology LVIDs:         2.20 cm   LV e' medial:    5.98 cm/s LV PW:         1.50 cm   LV E/e' medial:  26.8 LV IVS:        1.10 cm   LV e' lateral:   6.74 cm/s LVOT diam:     1.80 cm   LV E/e' lateral: 23.7 LV SV:         101 LV SV Index:   66 LVOT Area:     2.54 cm  RIGHT VENTRICLE RV S prime:     10.00 cm/s TAPSE (M-mode): 1.7 cm LEFT ATRIUM              Index        RIGHT ATRIUM           Index LA diam:        4.20 cm  2.74 cm/m   RA Area:     20.40 cm LA Vol (A2C):   133.0 ml 86.75 ml/m  RA Volume:   51.10 ml  33.33 ml/m LA Vol (A4C):   99.4 ml  64.83 ml/m LA Biplane Vol: 118.0 ml 76.96 ml/m  AORTIC VALVE AV Area (Vmax):    1.01 cm AV Area (Vmean):   1.09 cm AV Area (VTI):     1.15 cm AV Vmax:           337.00 cm/s AV Vmean:          227.000 cm/s AV VTI:            0.877 m AV Peak Grad:      45.4 mmHg AV Mean Grad:      24.0 mmHg LVOT Vmax:         134.00 cm/s LVOT Vmean:        97.500 cm/s LVOT VTI:          0.397 m LVOT/AV VTI ratio: 0.45 AI PHT:            666 msec  AORTA Ao Root diam: 3.00 cm MITRAL VALVE                  TRICUSPID VALVE MV Area (PHT): 3.27 cm       TR Peak grad:   46.2 mmHg MV Area VTI:   1.66 cm       TR Vmax:        340.00 cm/s MV Peak grad:  10.9 mmHg MV Mean grad:  3.0 mmHg       SHUNTS MV Vmax:       1.65 m/s       Systemic VTI:  0.40 m MV Vmean:      73.9 cm/s      Systemic Diam: 1.80 cm MV Decel Time: 232 msec MR Peak grad:    136.9 mmHg MR Mean grad:    81.0 mmHg MR Vmax:         585.00 cm/s MR Vmean:         395.0 cm/s MR PISA:         1.57 cm MR PISA Eff ROA: 8 mm MR PISA Radius:  0.50 cm MV E velocity: 160.00 cm/s MV A velocity: 129.00 cm/s MV E/A ratio:  1.24 Ola Berger MD Electronically signed by Ola Berger MD Signature Date/Time: 02/05/2024/1:21:02 PM    Final    DG Chest Portable 1 View Result Date: 02/04/2024  CLINICAL DATA:  Shortness of breath EXAM: PORTABLE CHEST 1 VIEW COMPARISON:  01/31/2024 FINDINGS: Cardiomegaly. Aortic atherosclerosis. Small left pleural effusion with left base atelectasis. Right lung clear. No edema. No acute bony abnormality. IMPRESSION: Cardiomegaly. Small left pleural effusion with left base atelectasis. Electronically Signed   By: Janeece Mechanic M.D.   On: 02/04/2024 18:19    amLODipine   5 mg Oral Daily   Chlorhexidine  Gluconate Cloth  6 each Topical Daily   furosemide   80 mg Intravenous TID   heparin   5,000 Units Subcutaneous Q8H   insulin  aspart  0-9 Units Subcutaneous TID WC   insulin  glargine-yfgn  10 Units Subcutaneous QHS    BMET    Component Value Date/Time   NA 127 (L) 02/06/2024 0322   NA 134 11/28/2023 1307   K 4.3 02/06/2024 0322   CL 92 (L) 02/06/2024 0322   CO2 20 (L) 02/06/2024 0322   GLUCOSE 131 (H) 02/06/2024 0322   BUN 110 (H) 02/06/2024 0322   BUN 61 (H) 11/28/2023 1307   CREATININE 4.06 (H) 02/06/2024 0322   CREATININE 1.70 (H) 02/01/2023 1356   CALCIUM  8.2 (L) 02/06/2024 0322   GFRNONAA 10 (L) 02/06/2024 0322   GFRNONAA 40 (L) 06/29/2020 0916   GFRAA 46 (L) 06/29/2020 0916   CBC    Component Value Date/Time   WBC 3.9 (L) 02/06/2024 0322   RBC 2.57 (L) 02/06/2024 0322   HGB 8.0 (L) 02/06/2024 0322   HGB 9.2 (L) 07/27/2023 0915   HCT 23.8 (L) 02/06/2024 0322   HCT 28.2 (L) 07/27/2023 0915   PLT 202 02/06/2024 0322   PLT 256 07/27/2023 0915   MCV 92.6 02/06/2024 0322   MCV 96 07/27/2023 0915   MCH 31.1 02/06/2024 0322   MCHC 33.6 02/06/2024 0322   RDW 13.3 02/06/2024 0322   RDW 12.6 07/27/2023 0915   LYMPHSABS 0.7  01/22/2024 0122   LYMPHSABS 0.7 07/16/2023 0921   MONOABS 0.6 01/22/2024 0122   EOSABS 0.1 01/22/2024 0122   EOSABS 0.0 07/16/2023 0921   BASOSABS 0.0 01/22/2024 0122   BASOSABS 0.0 07/16/2023 1610     Assessment/Plan:   AKI/CKD stage III - in setting of acute on chronic HFpEF consistent with recurrent cardiorenal syndrome.  Has significant volume overload and started IV lasix  80 mg tid yesterday without much UOP.  Will increase dose to 120 mg and frequency to q 6 and follow UOP and daily Scr.  No urgent indication for dialysis, which she is adamant that she would not want.  "I would rather just pass away".  Avoid nephrotoxic medications including NSAIDs and iodinated intravenous contrast exposure unless the latter is absolutely indicated.   Preferred narcotic agents for pain control are hydromorphone, fentanyl , and methadone. Morphine  should not be used.  Avoid Baclofen and avoid oral sodium phosphate  and magnesium  citrate based laxatives / bowel preps.  Continue strict Input and Output monitoring. Will monitor the patient closely with you and intervene or adjust therapy as indicated by changes in clinical status/labs  Acute on chronic HFpEF - due to lack of torsemide  after discharge.  Did not respond to po torsemide  yesterday.  Will increase IV lasix  to 120 mg q6 as above.  ECHO per primary svc which again showed grade II DD.  Anemia of CKD stage III - TSAT 17% and low iron.  Dosed with IV iron and follow but will likely need to initiate ESA therapy. HTN - developed elevated BP yesterday and started on IV hydralazine  Hypervolemic hyponatremia -  follow with diuresis.  Currently asymptomatic.  DM type 2 - per primary  Benjamin Brands, MD [facility]

## 2024-02-06 NOTE — Inpatient Diabetes Management (Signed)
 Inpatient Diabetes Program Recommendations  AACE/ADA: New Consensus Statement on Inpatient Glycemic Control   Target Ranges:  Prepandial:   less than 140 mg/dL      Peak postprandial:   less than 180 mg/dL (1-2 hours)      Critically ill patients:  140 - 180 mg/dL     Latest Reference Range & Units 02/05/24 07:39 02/05/24 11:12 02/05/24 16:45 02/05/24 21:03 02/06/24 07:46  Glucose-Capillary 70 - 99 mg/dL 956 (H) 213 (H) 086 (H) 243 (H) 63 (L)   Review of Glycemic Control  Diabetes history: DM2 Outpatient Diabetes medications: Humalog  0-12 units TID Current orders for Inpatient glycemic control: Semglee  10 units at bedtime, Novolog  0-9 units TID with meals  Inpatient Diabetes Program Recommendations:    Insulin : CBG 63 mg/dl this morning and post prandial up to 248 mg/dl on 5/78/46.  Please consider decreasing Semglee  to 6 units at bedtime and adding Novolog  2 units TID with meals for meal coverage if patient eats at least 50% of meals.  Thanks, Beacher Limerick, RN, MSN, CDCES Diabetes Coordinator Inpatient Diabetes Program [phone] (Team Pager from 8am to 5pm)

## 2024-02-07 DIAGNOSIS — I5033 Acute on chronic diastolic (congestive) heart failure: Secondary | ICD-10-CM | POA: Diagnosis not present

## 2024-02-07 LAB — CBC WITH DIFFERENTIAL/PLATELET
Abs Immature Granulocytes: 0.02 10*3/uL (ref 0.00–0.07)
Basophils Absolute: 0 10*3/uL (ref 0.0–0.1)
Basophils Relative: 0 %
Eosinophils Absolute: 0 10*3/uL (ref 0.0–0.5)
Eosinophils Relative: 1 %
HCT: 25.3 % — ABNORMAL LOW (ref 36.0–46.0)
Hemoglobin: 8.4 g/dL — ABNORMAL LOW (ref 12.0–15.0)
Immature Granulocytes: 0 %
Lymphocytes Relative: 19 %
Lymphs Abs: 0.9 10*3/uL (ref 0.7–4.0)
MCH: 30.4 pg (ref 26.0–34.0)
MCHC: 33.2 g/dL (ref 30.0–36.0)
MCV: 91.7 fL (ref 80.0–100.0)
Monocytes Absolute: 0.5 10*3/uL (ref 0.1–1.0)
Monocytes Relative: 10 %
Neutro Abs: 3.3 10*3/uL (ref 1.7–7.7)
Neutrophils Relative %: 70 %
Platelets: 194 10*3/uL (ref 150–400)
RBC: 2.76 MIL/uL — ABNORMAL LOW (ref 3.87–5.11)
RDW: 13.6 % (ref 11.5–15.5)
WBC: 4.7 10*3/uL (ref 4.0–10.5)
nRBC: 0 % (ref 0.0–0.2)

## 2024-02-07 LAB — RENAL FUNCTION PANEL
Albumin: 3 g/dL — ABNORMAL LOW (ref 3.5–5.0)
Anion gap: 9 (ref 5–15)
BUN: 104 mg/dL — ABNORMAL HIGH (ref 8–23)
CO2: 28 mmol/L (ref 22–32)
Calcium: 8.2 mg/dL — ABNORMAL LOW (ref 8.9–10.3)
Chloride: 88 mmol/L — ABNORMAL LOW (ref 98–111)
Creatinine, Ser: 3.64 mg/dL — ABNORMAL HIGH (ref 0.44–1.00)
GFR, Estimated: 12 mL/min — ABNORMAL LOW (ref >60)
Glucose, Bld: 53 mg/dL — ABNORMAL LOW (ref 70–99)
Phosphorus: 5.1 mg/dL — ABNORMAL HIGH (ref 2.5–4.6)
Potassium: 3.2 mmol/L — ABNORMAL LOW (ref 3.5–5.1)
Sodium: 125 mmol/L — ABNORMAL LOW (ref 135–145)

## 2024-02-07 LAB — GLUCOSE, CAPILLARY
Glucose-Capillary: 47 mg/dL — ABNORMAL LOW (ref 70–99)
Glucose-Capillary: 123 mg/dL — ABNORMAL HIGH (ref 70–99)
Glucose-Capillary: 281 mg/dL — ABNORMAL HIGH (ref 70–99)
Glucose-Capillary: 281 mg/dL — ABNORMAL HIGH (ref 70–99)
Glucose-Capillary: 228 mg/dL — ABNORMAL HIGH (ref 70–99)

## 2024-02-07 LAB — CORTISOL-AM, BLOOD: Cortisol - AM: 16.7 ug/dL (ref 6.7–22.6)

## 2024-02-07 MED ORDER — AMLODIPINE BESYLATE 5 MG PO TABS
10.0000 mg | ORAL_TABLET | Freq: Every day | ORAL | Status: DC
  Administered 2024-02-07 – 2024-02-13 (×7): 10 mg via ORAL
  Filled 2024-02-07 (×7): qty 2

## 2024-02-07 MED ORDER — POTASSIUM CHLORIDE CRYS ER 20 MEQ PO TBCR
40.0000 meq | EXTENDED_RELEASE_TABLET | Freq: Once | ORAL | Status: AC
  Administered 2024-02-07: 40 meq via ORAL
  Filled 2024-02-07: qty 2

## 2024-02-07 NOTE — Progress Notes (Signed)
 Physical Therapy Treatment Patient Details Name: Emily Jennings MRN: [id number] DOB: 02-28-1934 Today's Date: 02/07/2024   History of Present Illness Emily Jennings is a 89 y.o. female with medical history significant of diastolic heart failure, insulin  dependent type 2 diabetes, coronary artery disease, CKD stage IV who presented to the ED for shortness of breath.  She states that she was not able to take the new fluid medication.  Apparently her insurance company did not pay for the 60 mg tablet.  Patient reports she feels like she is swollen up to her chest.  Denies any chest discomfort, palpitations, fever, nausea, vomiting.  Endorses some intermittent cough and diarrhea but these are not new.    PT Comments  Pt sitting in chair upon therapist entrance and willing to participate.  Pt stated she has some SOB, vitals checked with O2 saturation at 97% on room air.  Pt with min A with transfers and gait, required cueing to push from chair vs pulling on RW to reduce risk of posterior fall.  Pt with tendency to push walker too far ahead or stand on side of walker when turning around to return to chair, educated on proper positioning in walker.  EOS pt left in chair with call bell within reach, RN aware of status.   If plan is discharge home, recommend the following:     Can travel by private vehicle        Equipment Recommendations       Recommendations for Other Services       Precautions / Restrictions Precautions Precautions: Fall Restrictions Weight Bearing Restrictions Per Provider Order: No     Mobility  Bed Mobility               General bed mobility comments: pt sitting in chair upon therapist entrance    Transfers Overall transfer level: Needs assistance Equipment used: Rolling walker (2 wheels) Transfers: Sit to/from Stand Sit to Stand: Min assist           General transfer comment: Cueing for hand positions as tendency to pull on RW vs push from chair     Ambulation/Gait Ambulation/Gait assistance: Mod assist Gait Distance (Feet): 25 Feet Assistive device: Rolling walker (2 wheels) Gait Pattern/deviations: Decreased step length - right, Decreased step length - left, Decreased stride length, Staggering left, Staggering right Gait velocity: decreased     General Gait Details: Cueing to stand within walker, tendency to push too far or stand on side of walker   Stairs             Wheelchair Mobility     Tilt Bed    Modified Rankin (Stroke Patients Only)       Balance                                            Communication Communication Communication: No apparent difficulties  Cognition Arousal: Alert Behavior During Therapy: WFL for tasks assessed/performed   PT - Cognitive impairments: No apparent impairments, Difficult to assess                           Following commands impaired: Follows one step commands with increased time, Follows multi-step commands inconsistently    Cueing Cueing Techniques: Verbal cues, Tactile cues  Exercises      General Comments  Pertinent Vitals/Pain Pain Assessment Pain Assessment: No/denies pain    Home Living                          Prior Function            PT Goals (current goals can now be found in the care plan section)      Frequency           PT Plan      Co-evaluation              AM-PAC PT "6 Clicks" Mobility   Outcome Measure  Help needed turning from your back to your side while in a flat bed without using bedrails?: A Little Help needed moving from lying on your back to sitting on the side of a flat bed without using bedrails?: A Little Help needed moving to and from a bed to a chair (including a wheelchair)?: A Little Help needed standing up from a chair using your arms (e.g., wheelchair or bedside chair)?: A Little Help needed to walk in hospital room?: A Lot Help needed climbing 3-5  steps with a railing? : A Lot 6 Click Score: 16    End of Session Equipment Utilized During Treatment: Gait belt Activity Tolerance: Patient tolerated treatment well;Patient limited by fatigue Patient left: in chair;with call bell/phone within reach;with chair alarm set Nurse Communication: Mobility status PT Visit Diagnosis: Unsteadiness on feet (R26.81);Other abnormalities of gait and mobility (R26.89);Muscle weakness (generalized) (M62.81)     Time: 1050-1110 PT Time Calculation (min) (ACUTE ONLY): 20 min  Charges:    $Therapeutic Activity: 8-22 mins PT General Charges $$ ACUTE PT VISIT: 1 Visit                    Minor Amble, LPTA/CLT; CBIS [phone]  Alesia Anchors 02/07/2024, 11:19 AM

## 2024-02-07 NOTE — Progress Notes (Signed)
 Pt BP systolic ranging between 160 to 211 over shift MD made aware. two PRN doses of hydralazine  given.

## 2024-02-07 NOTE — Progress Notes (Signed)
 [facility] PROGRESS NOTE  Emily Jennings (DOB: 05-May-1934) ZOX:[id number] PCP: Towanda Fret, MD  Brief Narrative: Emily Jennings is Jennings 89 y.o. female with a history of HFpEF, stage IV CKD, CAD who presented to the ED on 02/04/2024 with dyspnea, leg swelling, orthopnea worsening since recent discharge as she was unable to obtain torsemide  prescribed at that time. In the ED she had evidence of interstitial pulmonary edema and volume overload. Nephrology was consulted for assistance with diuresing the patient and she was admitted for acute on chronic HFpEF with cardiorenal syndrome. Transferred to SDU 5/21 due to hypothermia in the absence of infectious nidus. Palliative care is following and the patient has set limits on her care declining dialysis and she is DNR.  Subjective: Eating ok, but low glucose this AM. No other complaints. Denies chest pain or dyspnea.   Objective: BP (!) 197/55   Pulse 68   Temp 97.9 F (36.6 C) (Oral)   Resp 18   Ht 5' (1.524 m)   Wt 61.1 kg   SpO2 97%   BMI 26.31 kg/m   Gen: Frail elderly female in no distress Pulm: Clear, nonlabored  CV: RRR, no MRG, stable significant diffuse and dependent edema. GI: Soft, NT, ND, +BS  Neuro: Alert and oriented, HOH. No new focal deficits. Ext: Warm, no deformities. Skin: No new rashes, lesions or ulcers on visualized skin   Assessment & Plan: Acute on chronic HFpEF: Echo this admit stable from previous with hyperdynamic LV, G2DD, mild to moderate AS - Continue higher dose lasix  per nephrology whose assistance is appreciated. UOP still modest as charted. - Palliative care also consulted.    AKI on stage IV CKD:  - Baseline creatinine 3.2-3.5. BUN and Cr remain quite elevated, but no uremic symptoms and slightly improving. Bicarb nl, will supp K now that loop diuretic inducing hypoK. Appreciate nephrology consultation. Pt very clear in her desire to avoid dialysis.    Hypothermia: With no infectious  symptoms. AM cortisol 16.7. TSH wnl at 3.081.  - Monitoring screening blood cultures (NGTD from 5/21) - Remain in SDU - Monitor off abx for now.    T2DM: HbA1c 7.4%. Hypoglycemia this AM after 9u novolog , decreased renal clearance and limited gluconeogenesis.  - Continue basal insulin  at 5u qHS and avoid overcorrection.  - Continue 2u novolog  + sensitive/renal SSI. Some permissive hyperglycemia anticipated.   HTN with HTN urgency:  - Continue norvasc  increase to 10mg , restarted home clonidine  to avoid rebound  - Continue prn hydralazine , avoid abrupt lowering of BP. Anticipate improvement with diuresis.   Hypervolemic hyponatremia:  - Continue efforts at diuresis, monitoring daily. Asymptomatic.   Anemia of CKD - Anemia panel wnl, though will supp folic acid (low normal at 6.7) - Defer ESA to nephrology.   HLD:  - Statin  Weakness: SNF recommended, though pt and spouse very clearly state she'll be going back home with [facility] support. Continue mobility efforts here.   Emily Heckle, MD [facility] [email or web address] 02/07/2024, 11:19 AM

## 2024-02-07 NOTE — Plan of Care (Signed)
  Problem: Education: Goal: Knowledge of General Education information will improve Description: Including pain rating scale, medication(s)/side effects and non-pharmacologic comfort measures Outcome: Progressing   Problem: Health Behavior/Discharge Planning: Goal: Ability to manage health-related needs will improve Outcome: Progressing   Problem: Clinical Measurements: Goal: Ability to maintain clinical measurements within normal limits will improve Outcome: Progressing Goal: Will remain free from infection Outcome: Progressing Goal: Diagnostic test results will improve Outcome: Progressing Goal: Respiratory complications will improve Outcome: Progressing Goal: Cardiovascular complication will be avoided Outcome: Progressing   Problem: Activity: Goal: Risk for activity intolerance will decrease Outcome: Progressing   Problem: Nutrition: Goal: Adequate nutrition will be maintained Outcome: Progressing   Problem: Coping: Goal: Level of anxiety will decrease Outcome: Progressing   Problem: Elimination: Goal: Will not experience complications related to bowel motility Outcome: Progressing Goal: Will not experience complications related to urinary retention Outcome: Progressing   Problem: Pain Managment: Goal: General experience of comfort will improve and/or be controlled Outcome: Progressing   Problem: Safety: Goal: Ability to remain free from injury will improve Outcome: Progressing   Problem: Skin Integrity: Goal: Risk for impaired skin integrity will decrease Outcome: Progressing   Problem: Education: Goal: Ability to demonstrate management of disease process will improve Outcome: Progressing Goal: Ability to verbalize understanding of medication therapies will improve Outcome: Progressing Goal: Individualized Educational Video(s) Outcome: Progressing   Problem: Activity: Goal: Capacity to carry out activities will improve Outcome: Progressing    Problem: Cardiac: Goal: Ability to achieve and maintain adequate cardiopulmonary perfusion will improve Outcome: Progressing   Problem: Education: Goal: Ability to demonstrate management of disease process will improve Outcome: Progressing Goal: Ability to verbalize understanding of medication therapies will improve Outcome: Progressing Goal: Individualized Educational Video(s) Outcome: Progressing   Problem: Activity: Goal: Capacity to carry out activities will improve Outcome: Progressing   Problem: Cardiac: Goal: Ability to achieve and maintain adequate cardiopulmonary perfusion will improve Outcome: Progressing   Problem: Education: Goal: Ability to describe self-care measures that may prevent or decrease complications (Diabetes Survival Skills Education) will improve Outcome: Progressing Goal: Individualized Educational Video(s) Outcome: Progressing   Problem: Coping: Goal: Ability to adjust to condition or change in health will improve Outcome: Progressing   Problem: Fluid Volume: Goal: Ability to maintain a balanced intake and output will improve Outcome: Progressing   Problem: Health Behavior/Discharge Planning: Goal: Ability to identify and utilize available resources and services will improve Outcome: Progressing Goal: Ability to manage health-related needs will improve Outcome: Progressing   Problem: Metabolic: Goal: Ability to maintain appropriate glucose levels will improve Outcome: Progressing   Problem: Nutritional: Goal: Maintenance of adequate nutrition will improve Outcome: Progressing Goal: Progress toward achieving an optimal weight will improve Outcome: Progressing   Problem: Skin Integrity: Goal: Risk for impaired skin integrity will decrease Outcome: Progressing   Problem: Tissue Perfusion: Goal: Adequacy of tissue perfusion will improve Outcome: Progressing

## 2024-02-07 NOTE — Plan of Care (Signed)

## 2024-02-07 NOTE — Inpatient Diabetes Management (Signed)
 Inpatient Diabetes Program Recommendations  AACE/ADA: New Consensus Statement on Inpatient Glycemic Control  Target Ranges:  Prepandial:   less than 140 mg/dL      Peak postprandial:   less than 180 mg/dL (1-2 hours)      Critically ill patients:  140 - 180 mg/dL    Latest Reference Range & Units 02/06/24 07:46 02/06/24 11:49 02/06/24 16:32 02/06/24 21:30 02/06/24 23:22 02/07/24 07:36  Glucose-Capillary 70 - 99 mg/dL 63 (L) 782 (H) 956 (H)  Novolog  5 units 311 (H)    Semglee  5 units  253 (H)  Novolog  9 units 47 (L)   Review of Glycemic Control  Diabetes history: DM2 Outpatient Diabetes medications: Humalog  0-12 units TID Current orders for Inpatient glycemic control: Semglee  5 units at bedtime, Novolog  0-9 units TID with meals, Novolog  2 units TID with meals   Inpatient Diabetes Program Recommendations:    Insulin : CBG 253 mg/dl at 21:30 last night and patient was given Novolog  9 units at 23:57. CBG 47 mg/dl this morning. Anticipate hypoglycemia this morning due to getting Novolog  9 units last night.    Thanks, Beacher Limerick, RN, MSN, CDCES Diabetes Coordinator Inpatient Diabetes Program [phone] (Team Pager from 8am to 5pm)

## 2024-02-07 NOTE — Progress Notes (Signed)
 Patient ID: Emily Jennings, female   DOB: 08/10/1934, 89 y.o.   MRN: [id number] S: No new complaints this morning. O:BP (!) 207/77   Pulse 74   Temp 97.9 F (36.6 C) (Oral)   Resp 15   Ht 5' (1.524 m)   Wt 61.1 kg   SpO2 100%   BMI 26.31 kg/m   Intake/Output Summary (Last 24 hours) at 02/07/2024 0914 Last data filed at 02/07/2024 0834 Gross per 24 hour  Intake 178.55 ml  Output 1050 ml  Net -871.45 ml   Intake/Output: I/O last 3 completed shifts: In: 178.6 [IV Piggyback:178.6] Out: 500 [Urine:500]  Intake/Output this shift:  Total I/O In: -  Out: 550 [Urine:550] Weight change: -0.4 kg Gen: NAD CVS: RRR Resp:CTA Abd:+BS, soft, NT/ND Ext: 1+ edema of bilateral lower ext to presacral region  Recent Labs  Lab 02/04/24 1653 02/04/24 2121 02/06/24 0322 02/07/24 0421  NA 124* 125* 127* 125*  K 4.3 4.4 4.3 3.2*  CL 86* 89* 92* 88*  CO2 26 23 20* 28  GLUCOSE 156* 173* 131* 53*  BUN 111* 109* 110* 104*  CREATININE 4.17* 3.90* 4.06* 3.64*  ALBUMIN   --  2.9* 2.8* 3.0*  CALCIUM  8.8* 8.6* 8.2* 8.2*  PHOS  --   --  6.1* 5.1*  AST  --  21  --   --   ALT  --  14  --   --    Liver Function Tests: Recent Labs  Lab 02/04/24 2121 02/06/24 0322 02/07/24 0421  AST 21  --   --   ALT 14  --   --   ALKPHOS 59  --   --   BILITOT 0.5  --   --   PROT 5.0*  --   --   ALBUMIN  2.9* 2.8* 3.0*   No results for input(s): "LIPASE", "AMYLASE" in the last 168 hours. No results for input(s): "AMMONIA" in the last 168 hours. CBC: Recent Labs  Lab 02/04/24 1653 02/05/24 0517 02/06/24 0322 02/07/24 0421  WBC 4.3 4.0 3.9* 4.7  NEUTROABS  --   --   --  3.3  HGB 7.9* 8.0* 8.0* 8.4*  HCT 23.0* 23.4* 23.8* 25.3*  MCV 92.7 92.1 92.6 91.7  PLT 212 202 202 194   Cardiac Enzymes: No results for input(s): "CKTOTAL", "CKMB", "CKMBINDEX", "TROPONINI" in the last 168 hours. CBG: Recent Labs  Lab 02/06/24 1632 02/06/24 2130 02/06/24 2322 02/07/24 0736 02/07/24 0832  GLUCAP 266* 311*  253* 47* 123*    Iron Studies:  Recent Labs    02/05/24 0517  IRON 46  TIBC 274  FERRITIN 107   Studies/Results: ECHOCARDIOGRAM COMPLETE Result Date: 02/05/2024    ECHOCARDIOGRAM REPORT   Patient Name:   CAROLEANN CASLER Date of Exam: 02/05/2024 Medical Rec #:  [id number]     Height:       60.0 in Accession #:    [id number]    Weight:       125.9 lb Date of Birth:  1933-12-28      BSA:          1.533 m Patient Age:    89 years      BP:           150/63 mmHg Patient Gender: F             HR:           52 bpm. Exam [facility] Procedure: 2D Echo, Cardiac Doppler and Color  Doppler (Both Spectral and Color            Flow Doppler were utilized during procedure). Indications:    CHF-Acute Diastolic I50.31  History:        Patient has prior history of Echocardiogram examinations, most                 recent 04/27/2023. CHF, Aortic Valve Disease; Risk                 Factors:Hypertension, Diabetes and Dyslipidemia.  Sonographer:    Denese Finn RCS Referring Phys: [id number] VONDRA BRIMAGE IMPRESSIONS  1. Left ventricular ejection fraction, by estimation, is 70 to 75%. The left ventricle has hyperdynamic function. The left ventricle has no regional wall motion abnormalities. There is moderate asymmetric left ventricular hypertrophy. Left ventricular diastolic parameters are consistent with Grade II diastolic dysfunction (pseudonormalization).  2. Right ventricular systolic function is normal. The right ventricular size is normal. Mildly increased right ventricular wall thickness.  3. Left atrial size was severely dilated.  4. Right atrial size was moderately dilated.  5. Elongated and thickened mitral chordae There is systolic anteror motion of chord into LVOT during systole Similar to findings in echo from August 2024. Aaron Aas The mitral valve is myxomatous. Mild mitral valve regurgitation.  6. AV is thickened, calcified with restricted motion. Peak and mean gradients through the valve are 46 and 24 mm Hg  respectively. AVA (VTI) is 1.15 cm2. Dimensionless indext is 0.45 Ovreall consistent with mild to moderate AS. Aaron Aas The aortic valve is tricuspid. Aortic valve regurgitation is trivial.  7. The inferior vena cava is dilated in size with >50% respiratory variability, suggesting right atrial pressure of 8 mmHg. FINDINGS  Left Ventricle: Left ventricular ejection fraction, by estimation, is 70 to 75%. The left ventricle has hyperdynamic function. The left ventricle has no regional wall motion abnormalities. The left ventricular internal cavity size was normal in size. There is moderate asymmetric left ventricular hypertrophy. Left ventricular diastolic parameters are consistent with Grade II diastolic dysfunction (pseudonormalization). Right Ventricle: The right ventricular size is normal. Mildly increased right ventricular wall thickness. Right ventricular systolic function is normal. Left Atrium: Left atrial size was severely dilated. Right Atrium: Right atrial size was moderately dilated. Pericardium: There is no evidence of pericardial effusion. Mitral Valve: Elongated and thickened mitral chordae There is systolic anteror motion of chord into LVOT during systole Similar to findings in echo from August 2024. The mitral valve is myxomatous. Mild mitral valve regurgitation. MV peak gradient, 10.9 mmHg. The mean mitral valve gradient is 3.0 mmHg. Tricuspid Valve: The tricuspid valve is normal in structure. Tricuspid valve regurgitation is mild. Aortic Valve: AV is thickened, calcified with restricted motion. Peak and mean gradients through the valve are 46 and 24 mm Hg respectively. AVA (VTI) is 1.15 cm2. Dimensionless indext is 0.45 Ovreall consistent with mild to moderate AS. The aortic valve  is tricuspid. Aortic valve regurgitation is trivial. Aortic regurgitation PHT measures 666 msec. Aortic valve mean gradient measures 24.0 mmHg. Aortic valve peak gradient measures 45.4 mmHg. Aortic valve area, by VTI measures  1.15 cm. Pulmonic Valve: The pulmonic valve was thickened with good excursion. Pulmonic valve regurgitation is mild. Aorta: The aortic root is normal in size and structure. Venous: The inferior vena cava is dilated in size with greater than 50% respiratory variability, suggesting right atrial pressure of 8 mmHg. IAS/Shunts: No atrial level shunt detected by color flow Doppler.  LEFT VENTRICLE PLAX 2D LVIDd:  4.40 cm   Diastology LVIDs:         2.20 cm   LV e' medial:    5.98 cm/s LV PW:         1.50 cm   LV E/e' medial:  26.8 LV IVS:        1.10 cm   LV e' lateral:   6.74 cm/s LVOT diam:     1.80 cm   LV E/e' lateral: 23.7 LV SV:         101 LV SV Index:   66 LVOT Area:     2.54 cm  RIGHT VENTRICLE RV S prime:     10.00 cm/s TAPSE (M-mode): 1.7 cm LEFT ATRIUM              Index        RIGHT ATRIUM           Index LA diam:        4.20 cm  2.74 cm/m   RA Area:     20.40 cm LA Vol (A2C):   133.0 ml 86.75 ml/m  RA Volume:   51.10 ml  33.33 ml/m LA Vol (A4C):   99.4 ml  64.83 ml/m LA Biplane Vol: 118.0 ml 76.96 ml/m  AORTIC VALVE AV Area (Vmax):    1.01 cm AV Area (Vmean):   1.09 cm AV Area (VTI):     1.15 cm AV Vmax:           337.00 cm/s AV Vmean:          227.000 cm/s AV VTI:            0.877 m AV Peak Grad:      45.4 mmHg AV Mean Grad:      24.0 mmHg LVOT Vmax:         134.00 cm/s LVOT Vmean:        97.500 cm/s LVOT VTI:          0.397 m LVOT/AV VTI ratio: 0.45 AI PHT:            666 msec  AORTA Ao Root diam: 3.00 cm MITRAL VALVE                  TRICUSPID VALVE MV Area (PHT): 3.27 cm       TR Peak grad:   46.2 mmHg MV Area VTI:   1.66 cm       TR Vmax:        340.00 cm/s MV Peak grad:  10.9 mmHg MV Mean grad:  3.0 mmHg       SHUNTS MV Vmax:       1.65 m/s       Systemic VTI:  0.40 m MV Vmean:      73.9 cm/s      Systemic Diam: 1.80 cm MV Decel Time: 232 msec MR Peak grad:    136.9 mmHg MR Mean grad:    81.0 mmHg MR Vmax:         585.00 cm/s MR Vmean:        395.0 cm/s MR PISA:         1.57 cm MR  PISA Eff ROA: 8 mm MR PISA Radius:  0.50 cm MV E velocity: 160.00 cm/s MV A velocity: 129.00 cm/s MV E/A ratio:  1.24 Ola Berger MD Electronically signed by Ola Berger MD Signature Date/Time: 02/05/2024/1:21:02 PM    Final     amLODipine   5 mg Oral Daily  Chlorhexidine  Gluconate Cloth  6 each Topical Daily   cloNIDine   0.3 mg Oral BID   folic acid  1 mg Oral Daily   heparin   5,000 Units Subcutaneous Q8H   insulin  aspart  0-9 Units Subcutaneous TID WC   insulin  aspart  2 Units Subcutaneous TID WC   insulin  glargine-yfgn  5 Units Subcutaneous QHS    BMET    Component Value Date/Time   NA 125 (L) 02/07/2024 0421   NA 134 11/28/2023 1307   K 3.2 (L) 02/07/2024 0421   CL 88 (L) 02/07/2024 0421   CO2 28 02/07/2024 0421   GLUCOSE 53 (L) 02/07/2024 0421   BUN 104 (H) 02/07/2024 0421   BUN 61 (H) 11/28/2023 1307   CREATININE 3.64 (H) 02/07/2024 0421   CREATININE 1.70 (H) 02/01/2023 1356   CALCIUM  8.2 (L) 02/07/2024 0421   GFRNONAA 12 (L) 02/07/2024 0421   GFRNONAA 40 (L) 06/29/2020 0916   GFRAA 46 (L) 06/29/2020 0916   CBC    Component Value Date/Time   WBC 4.7 02/07/2024 0421   RBC 2.76 (L) 02/07/2024 0421   HGB 8.4 (L) 02/07/2024 0421   HGB 9.2 (L) 07/27/2023 0915   HCT 25.3 (L) 02/07/2024 0421   HCT 28.2 (L) 07/27/2023 0915   PLT 194 02/07/2024 0421   PLT 256 07/27/2023 0915   MCV 91.7 02/07/2024 0421   MCV 96 07/27/2023 0915   MCH 30.4 02/07/2024 0421   MCHC 33.2 02/07/2024 0421   RDW 13.6 02/07/2024 0421   RDW 12.6 07/27/2023 0915   LYMPHSABS 0.9 02/07/2024 0421   LYMPHSABS 0.7 07/16/2023 0921   MONOABS 0.5 02/07/2024 0421   EOSABS 0.0 02/07/2024 0421   EOSABS 0.0 07/16/2023 0921   BASOSABS 0.0 02/07/2024 0421   BASOSABS 0.0 07/16/2023 1610      Assessment/Plan:   AKI/CKD stage III - in setting of acute on chronic HFpEF consistent with recurrent cardiorenal syndrome.  Has significant volume overload and started IV lasix  80 mg tid yesterday without much UOP.   She is starting to respond to increased dose of lasix  120 mg and frequency to q 6.  Scr starting to improve as well as UOP.  No urgent indication for dialysis, which she is adamant that she would not want.  "I would rather just pass away".  Avoid nephrotoxic medications including NSAIDs and iodinated intravenous contrast exposure unless the latter is absolutely indicated.   Preferred narcotic agents for pain control are hydromorphone, fentanyl , and methadone. Morphine  should not be used.  Avoid Baclofen and avoid oral sodium phosphate  and magnesium  citrate based laxatives / bowel preps.  Continue strict Input and Output monitoring. Will monitor the patient closely with you and intervene or adjust therapy as indicated by changes in clinical status/labs  Acute on chronic HFpEF - due to lack of torsemide  after discharge.  Did not respond to po torsemide  yesterday.  Will increase IV lasix  to 120 mg q6 as above.  ECHO per primary svc which again showed grade II DD.  Anemia of CKD stage III - TSAT 17% and low iron.  Dosed with IV iron and follow but will likely need to initiate ESA therapy. HTN with HTN urgency - developed markedly elevated BP on 02/05/24 and transferred to ICU.  She was started on IV hydralazine  and home clonidine  dose.  Bp still elevated.  Will increase amlodipine  to 10 mg daily and follow.  Hypervolemic hyponatremia - follow with diuresis.  Currently asymptomatic.  DM type 2 -  per primary  Benjamin [employer], MD [facility]

## 2024-02-07 NOTE — Progress Notes (Signed)
 Heart Failure Nurse Navigator Progress Note  PCP: Towanda Fret, MD PCP-Cardiologist: Armida Lander, MD Admission Diagnosis: Aute renal failure superimposed on chronic kidney disease, unspecified acute renal failure type, unspecified CKD stage (HCC) Uremia Other hypervolemia Hyproatremia Admitted from: Home  Presentation:   Emily Jennings presented with shortness of breath for several days. She has had a recent admission and was treated for diabetes (hyperglycemia) and acute kidney injury, hyponatremia, hypothermia and hypokalemia.  Her weight was up 2 kg since last admission. She reported that she was not able to take the new fluid medication because her insurance company did not pay for the 60 mg tablet.  So she felt like felt like she was swollen up to her chest. Chest x-ray:Cardiomegaly. Small left pleural effusion with left base atelectasis. BNP 1,335.0.   ECHO/ LVEF: 60-70% 04/2023 grade 2 diastolic dysfunction consistent with CHF  Clinical Course:  Past Medical History:  Diagnosis Date   Allergy    Anxiety    ANXIETY DISORDER, GENERALIZED 07/15/2007   Qualifier: Diagnosis of  By: Michaell Adolph     Arthritis    Cancer [facility]) 2009   breast, carcinoma in situ left   Carcinoma in situ of breast 05/21/2008   Qualifier: Diagnosis of  By: Rodolph Clap MD, Margaret  Diagnosed in 2009, completed 5 year course of tamoxifen, no evidence of recurrence    Carotid stenosis    11/16/2005  mild plaque formation and stenosis proximal right ECA   Cataract    Complication of anesthesia    Coronary artery disease    cardiac catheterization on 03/20/2006  LAD mid 40% stenosis, left circumflex mild 40% stenosis, RCA mid-vessel 40% to 50% lesion   EF 60%   Diabetes mellitus    GERD (gastroesophageal reflux disease)    Hernia, inguinal    left   Hyperglycemia    Hypertension    Insomnia 11/16/2011   Low blood potassium    Non-insulin  dependent type 2 diabetes mellitus (HCC)    Osteoporosis     Shortness of breath    2D Echocardiogram 01/26/2009   EF of greater than 55%, mild MR, mild TR, normal ventricular function   Thickened endometrium 10/26/2017   Noted by gyne in 2017, missed 6 month follow up, referred in 09/2017   Ventricular tachycardia, non-sustained (HCC)    developed during stress test 02/08/2006, spontaneously aborted, mild reversible apical defect     Social History   Socioeconomic History   Marital status: Married    Spouse name: Not on file   Number of children: 2   Years of education: na   Highest education level: Some college, no degree  Occupational History   Occupation: retired    Associate Professor: retired  Tobacco Use   Smoking status: Never    Passive exposure: Never   Smokeless tobacco: Never  Vaping Use   Vaping status: Never Used  Substance and Sexual Activity   Alcohol use: No   Drug use: No   Sexual activity: Not Currently    Birth control/protection: Post-menopausal  Other Topics Concern   Not on file  Social History Narrative   Lives with her husband and son and attends yoga at the [facility] 2 days a week and speaks to her grandson nightly on the phone   Social Drivers of Health   Financial Resource Strain: Low Risk  (12/28/2022)   Overall Financial Resource Strain (CARDIA)    Difficulty of Paying Living Expenses: Not hard at all  Food Insecurity:  No Food Insecurity (02/05/2024)   Hunger Vital Sign    Worried About Running Out of Food in the Last Year: Never true    Ran Out of Food in the Last Year: Never true  Transportation Needs: No Transportation Needs (02/05/2024)   PRAPARE - Administrator, Civil Service (Medical): No    Lack of Transportation (Non-Medical): No  Physical Activity: Sufficiently Active (11/15/2022)   Exercise Vital Sign    Days of Exercise per Week: 5 days    Minutes of Exercise per Session: 30 min  Stress: No Stress Concern Present (12/28/2022)   [employer] of Occupational Health - Occupational Stress  Questionnaire    Feeling of Stress : Only a little  Social Connections: Socially Integrated (02/05/2024)   Social Connection and Isolation Panel [NHANES]    Frequency of Communication with Friends and Family: Three times a week    Frequency of Social Gatherings with Friends and Family: Three times a week    Attends Religious Services: More than 4 times per year    Active Member of Clubs or Organizations: Yes    Attends Banker Meetings: 1 to 4 times per year    Marital Status: Married    High Risk Criteria for Readmission and/or Poor Patient Outcomes: Heart failure hospital admissions (last 6 months): 0  No Show rate: 10% Difficult social situation: Hearing Impaired Demonstrates medication adherence: Recently yes until after the last admission because she reported her insurance would not cover the medication. Primary Language: English Literacy level: Reading, Writing & Comprehension  Barriers of Care:   Hearing impaired  Considerations/Referrals:   Referral made to Heart Failure Pharmacist Stewardship: Yes Referral made to Heart Failure CSW/NCM TOC: No Referral made to Heart & Vascular TOC clinic: Yes.  New TOC scheduled for 02/15/24 @ 12:00 at [facility].   Items for Follow-up on DC/TOC: Diet & Fluid Restrictions Daily Weights Heart Failure Medications Continued Heart Failure Education   Celedonio Coil, RN, BSN [facility] Heart Failure Navigator Secure Chat Only

## 2024-02-07 NOTE — Progress Notes (Signed)
 Heart Failure Nurse Navigator Progress Note   Patient was called at [id number] to review Heart Failure Education with her.  Cassidy Mcbride, RN was caring for her at the time and provided her with a room telephone so I could call her.  Unfortunately when I called her she was unable to hear me. She said she was hearing impaired.  Dorisann Garre to provide patient with "Living Better with Heart Failure Booklet"  at the bedside.  She  also confirmed with patient if I could contact her husband via telephone and provide him with Heart Failure Education since she was unable to hear me.  She agreed to allowing me to call her husband Dany Harten on the telephone.   Celedonio Coil, RN, BSN [facility] Heart Failure Navigator Secure Chat Only

## 2024-02-07 NOTE — Plan of Care (Signed)
 Palliative-   Chart reviewed. Noted improvement in Cr today, however, hypertension persists.  Goals set at continued medical interventions with limit set at no HD. If patient's renal function were to continue to decline then would want to transition to comfort measures only.  Palliative will return on Monday and see patient if needed.    Micki Alas, AGNP-C Palliative Medicine  No charge

## 2024-02-08 ENCOUNTER — Ambulatory Visit: Payer: Medicare Other | Admitting: Internal Medicine

## 2024-02-08 DIAGNOSIS — I5033 Acute on chronic diastolic (congestive) heart failure: Secondary | ICD-10-CM | POA: Diagnosis not present

## 2024-02-08 LAB — GLUCOSE, CAPILLARY
Glucose-Capillary: 91 mg/dL (ref 70–99)
Glucose-Capillary: 109 mg/dL — ABNORMAL HIGH (ref 70–99)
Glucose-Capillary: 167 mg/dL — ABNORMAL HIGH (ref 70–99)
Glucose-Capillary: 105 mg/dL — ABNORMAL HIGH (ref 70–99)

## 2024-02-08 LAB — RENAL FUNCTION PANEL
Albumin: 2.7 g/dL — ABNORMAL LOW (ref 3.5–5.0)
Anion gap: 8 (ref 5–15)
BUN: 95 mg/dL — ABNORMAL HIGH (ref 8–23)
CO2: 28 mmol/L (ref 22–32)
Calcium: 8.2 mg/dL — ABNORMAL LOW (ref 8.9–10.3)
Chloride: 90 mmol/L — ABNORMAL LOW (ref 98–111)
Creatinine, Ser: 3.3 mg/dL — ABNORMAL HIGH (ref 0.44–1.00)
GFR, Estimated: 13 mL/min — ABNORMAL LOW (ref >60)
Glucose, Bld: 109 mg/dL — ABNORMAL HIGH (ref 70–99)
Phosphorus: 4.8 mg/dL — ABNORMAL HIGH (ref 2.5–4.6)
Potassium: 2.9 mmol/L — ABNORMAL LOW (ref 3.5–5.1)
Sodium: 126 mmol/L — ABNORMAL LOW (ref 135–145)

## 2024-02-08 MED ORDER — INSULIN ASPART 100 UNIT/ML IJ SOLN
4.0000 [IU] | Freq: Three times a day (TID) | INTRAMUSCULAR | Status: DC
  Administered 2024-02-08 – 2024-02-13 (×15): 4 [IU] via SUBCUTANEOUS

## 2024-02-08 MED ORDER — HYDRALAZINE HCL 50 MG PO TABS
50.0000 mg | ORAL_TABLET | Freq: Three times a day (TID) | ORAL | Status: DC
  Administered 2024-02-08 – 2024-02-09 (×3): 50 mg via ORAL
  Filled 2024-02-08 (×3): qty 1

## 2024-02-08 MED ORDER — POTASSIUM CHLORIDE CRYS ER 20 MEQ PO TBCR
40.0000 meq | EXTENDED_RELEASE_TABLET | Freq: Once | ORAL | Status: AC
  Administered 2024-02-08: 40 meq via ORAL
  Filled 2024-02-08: qty 2

## 2024-02-08 NOTE — Progress Notes (Signed)
 Patient ID: Emily Jennings, female   DOB: March 02, 1934, 89 y.o.   MRN: [id number] S:  2.4 L UOP with increased diuretics yesterday, net -2.2 L Out of bed in chair this morning without complaints, no dyspnea Continues with marked edema in the legs and proximal thighs Creatinine stable to improved, BUN down trended is a little bit.  Potassium of 2.9  O:BP (!) 199/90   Pulse 76   Temp (!) 97.5 F (36.4 C) (Oral)   Resp 15   Ht 5' (1.524 m)   Wt 60.8 kg   SpO2 94%   BMI 26.18 kg/m   Intake/Output Summary (Last 24 hours) at 02/08/2024 1140 Last data filed at 02/08/2024 0900 Gross per 24 hour  Intake 135.55 ml  Output 2500 ml  Net -2364.45 ml   Intake/Output: I/O last 3 completed shifts: In: 314.1 [IV Piggyback:314.1] Out: 2350 [Urine:2350]  Intake/Output this shift:  Total I/O In: -  Out: 700 [Urine:700] Weight change: 0 kg Gen: NAD CVS: RRR Resp:CTA Abd:+BS, soft, NT/ND Ext: 3+ edema of bilateral lower ext to presacral region  Recent Labs  Lab 02/04/24 1653 02/04/24 2121 02/06/24 0322 02/07/24 0421 02/08/24 0419  NA 124* 125* 127* 125* 126*  K 4.3 4.4 4.3 3.2* 2.9*  CL 86* 89* 92* 88* 90*  CO2 26 23 20* 28 28  GLUCOSE 156* 173* 131* 53* 109*  BUN 111* 109* 110* 104* 95*  CREATININE 4.17* 3.90* 4.06* 3.64* 3.30*  ALBUMIN   --  2.9* 2.8* 3.0* 2.7*  CALCIUM  8.8* 8.6* 8.2* 8.2* 8.2*  PHOS  --   --  6.1* 5.1* 4.8*  AST  --  21  --   --   --   ALT  --  14  --   --   --    Liver Function Tests: Recent Labs  Lab 02/04/24 2121 02/06/24 0322 02/07/24 0421 02/08/24 0419  AST 21  --   --   --   ALT 14  --   --   --   ALKPHOS 59  --   --   --   BILITOT 0.5  --   --   --   PROT 5.0*  --   --   --   ALBUMIN  2.9* 2.8* 3.0* 2.7*   No results for input(s): "LIPASE", "AMYLASE" in the last 168 hours. No results for input(s): "AMMONIA" in the last 168 hours. CBC: Recent Labs  Lab 02/04/24 1653 02/05/24 0517 02/06/24 0322 02/07/24 0421  WBC 4.3 4.0 3.9* 4.7  NEUTROABS   --   --   --  3.3  HGB 7.9* 8.0* 8.0* 8.4*  HCT 23.0* 23.4* 23.8* 25.3*  MCV 92.7 92.1 92.6 91.7  PLT 212 202 202 194   Cardiac Enzymes: No results for input(s): "CKTOTAL", "CKMB", "CKMBINDEX", "TROPONINI" in the last 168 hours. CBG: Recent Labs  Lab 02/07/24 1128 02/07/24 1639 02/07/24 2133 02/08/24 0734 02/08/24 1114  GLUCAP 281* 281* 228* 91 109*    Iron Studies:  No results for input(s): "IRON", "TIBC", "TRANSFERRIN", "FERRITIN" in the last 72 hours.  Studies/Results: No results found.   amLODipine   10 mg Oral Daily   Chlorhexidine  Gluconate Cloth  6 each Topical Daily   cloNIDine   0.3 mg Oral BID   folic acid  1 mg Oral Daily   heparin   5,000 Units Subcutaneous Q8H   insulin  aspart  0-9 Units Subcutaneous TID WC   insulin  aspart  4 Units Subcutaneous TID WC   insulin  glargine-yfgn  5 Units Subcutaneous QHS    BMET    Component Value Date/Time   NA 126 (L) 02/08/2024 0419   NA 134 11/28/2023 1307   K 2.9 (L) 02/08/2024 0419   CL 90 (L) 02/08/2024 0419   CO2 28 02/08/2024 0419   GLUCOSE 109 (H) 02/08/2024 0419   BUN 95 (H) 02/08/2024 0419   BUN 61 (H) 11/28/2023 1307   CREATININE 3.30 (H) 02/08/2024 0419   CREATININE 1.70 (H) 02/01/2023 1356   CALCIUM  8.2 (L) 02/08/2024 0419   GFRNONAA 13 (L) 02/08/2024 0419   GFRNONAA 40 (L) 06/29/2020 0916   GFRAA 46 (L) 06/29/2020 0916   CBC    Component Value Date/Time   WBC 4.7 02/07/2024 0421   RBC 2.76 (L) 02/07/2024 0421   HGB 8.4 (L) 02/07/2024 0421   HGB 9.2 (L) 07/27/2023 0915   HCT 25.3 (L) 02/07/2024 0421   HCT 28.2 (L) 07/27/2023 0915   PLT 194 02/07/2024 0421   PLT 256 07/27/2023 0915   MCV 91.7 02/07/2024 0421   MCV 96 07/27/2023 0915   MCH 30.4 02/07/2024 0421   MCHC 33.2 02/07/2024 0421   RDW 13.6 02/07/2024 0421   RDW 12.6 07/27/2023 0915   LYMPHSABS 0.9 02/07/2024 0421   LYMPHSABS 0.7 07/16/2023 0921   MONOABS 0.5 02/07/2024 0421   EOSABS 0.0 02/07/2024 0421   EOSABS 0.0 07/16/2023 0921    BASOSABS 0.0 02/07/2024 0421   BASOSABS 0.0 07/16/2023 [id number]      Assessment/Plan:   AKI/CKD stage III - in setting of acute on chronic HFpEF consistent with recurrent cardiorenal syndrome.  Has significant volume overload now responsive to increasing dosing frequency.  Continue for another 24 hours, she remains markedly hypervolemic and kidney function is tolerating.  Working with palliative.  Has chosen not to receive HD now or in the future. Acute on chronic HFpEF - due to lack of torsemide  after discharge.  As above.  ECHO per primary svc which again showed grade II DD.  Anemia of CKD stage III - TSAT 17% and low iron.  Dosed with IV iron.  Give ESA once blood pressures improved HTN with HTN urgency -remains elevated, asymptomatic.  On amlodipine  and clonidine .  Follow with volume unloading. Hypervolemic hyponatremia - follow with diuresis.  Currently asymptomatic.  DM type 2 - per primary  Charletta Cons, MD  [facility]

## 2024-02-08 NOTE — Plan of Care (Signed)
  Problem: Education: Goal: Knowledge of General Education information will improve Description: Including pain rating scale, medication(s)/side effects and non-pharmacologic comfort measures Outcome: Progressing   Problem: Health Behavior/Discharge Planning: Goal: Ability to manage health-related needs will improve Outcome: Progressing   Problem: Clinical Measurements: Goal: Ability to maintain clinical measurements within normal limits will improve Outcome: Progressing Goal: Will remain free from infection Outcome: Progressing Goal: Diagnostic test results will improve Outcome: Progressing Goal: Respiratory complications will improve Outcome: Progressing Goal: Cardiovascular complication will be avoided Outcome: Progressing   Problem: Activity: Goal: Risk for activity intolerance will decrease Outcome: Progressing   Problem: Nutrition: Goal: Adequate nutrition will be maintained Outcome: Progressing   Problem: Coping: Goal: Level of anxiety will decrease Outcome: Progressing   Problem: Elimination: Goal: Will not experience complications related to bowel motility Outcome: Progressing Goal: Will not experience complications related to urinary retention Outcome: Progressing   Problem: Pain Managment: Goal: General experience of comfort will improve and/or be controlled Outcome: Progressing   Problem: Safety: Goal: Ability to remain free from injury will improve Outcome: Progressing   Problem: Skin Integrity: Goal: Risk for impaired skin integrity will decrease Outcome: Progressing   Problem: Education: Goal: Ability to demonstrate management of disease process will improve Outcome: Progressing Goal: Ability to verbalize understanding of medication therapies will improve Outcome: Progressing Goal: Individualized Educational Video(s) Outcome: Progressing   Problem: Activity: Goal: Capacity to carry out activities will improve Outcome: Progressing    Problem: Cardiac: Goal: Ability to achieve and maintain adequate cardiopulmonary perfusion will improve Outcome: Progressing   Problem: Education: Goal: Ability to describe self-care measures that may prevent or decrease complications (Diabetes Survival Skills Education) will improve Outcome: Progressing Goal: Individualized Educational Video(s) Outcome: Progressing   Problem: Coping: Goal: Ability to adjust to condition or change in health will improve Outcome: Progressing   Problem: Fluid Volume: Goal: Ability to maintain a balanced intake and output will improve Outcome: Progressing   Problem: Health Behavior/Discharge Planning: Goal: Ability to identify and utilize available resources and services will improve Outcome: Progressing Goal: Ability to manage health-related needs will improve Outcome: Progressing   Problem: Metabolic: Goal: Ability to maintain appropriate glucose levels will improve Outcome: Progressing   Problem: Nutritional: Goal: Maintenance of adequate nutrition will improve Outcome: Progressing Goal: Progress toward achieving an optimal weight will improve Outcome: Progressing   Problem: Skin Integrity: Goal: Risk for impaired skin integrity will decrease Outcome: Progressing   Problem: Tissue Perfusion: Goal: Adequacy of tissue perfusion will improve Outcome: Progressing   Problem: Education: Goal: Ability to demonstrate management of disease process will improve Outcome: Progressing Goal: Ability to verbalize understanding of medication therapies will improve Outcome: Progressing Goal: Individualized Educational Video(s) Outcome: Progressing   Problem: Activity: Goal: Capacity to carry out activities will improve Outcome: Progressing   Problem: Cardiac: Goal: Ability to achieve and maintain adequate cardiopulmonary perfusion will improve Outcome: Progressing

## 2024-02-08 NOTE — Plan of Care (Signed)
  Problem: Education: Goal: Knowledge of General Education information will improve Description: Including pain rating scale, medication(s)/side effects and non-pharmacologic comfort measures Outcome: Progressing   Problem: Coping: Goal: Level of anxiety will decrease Outcome: Progressing   Problem: Coping: Goal: Ability to adjust to condition or change in health will improve Outcome: Progressing   Problem: Fluid Volume: Goal: Ability to maintain a balanced intake and output will improve Outcome: Progressing

## 2024-02-08 NOTE — Progress Notes (Signed)
 [facility] PROGRESS NOTE  Emily Jennings (DOB: 1933-12-03) ZOX:[id number] PCP: Towanda Fret, MD  Brief Narrative: Emily Jennings is an 89 y.o. female with a history of HFpEF, stage IV CKD, CAD who presented to the ED on 02/04/2024 with dyspnea, leg swelling, orthopnea worsening since recent discharge as she was unable to obtain torsemide  prescribed at that time. In the ED she had evidence of interstitial pulmonary edema and volume overload. Nephrology was consulted for assistance with diuresing the patient and she was admitted for acute on chronic HFpEF with cardiorenal syndrome. Transferred to SDU 5/21 due to hypothermia in the absence of infectious nidus. Palliative care is following and the patient has set limits on her care declining dialysis and she is DNR.  Subjective: No new complaints, having better urine output, still swollen. Eating ok. No headache or chest pain.   Objective: BP (!) 199/90   Pulse 76   Temp (!) 97.5 F (36.4 C) (Oral)   Resp 15   Ht 5' (1.524 m)   Wt 60.8 kg   SpO2 94%   BMI 26.18 kg/m   Gen: Frail elderly pleasant female in no distress Pulm: Clear, nonlabored.   CV: RRR, 2-3+ pitting edema throughout LE's GI: Soft, NT, ND, +BS  Neuro: Alert and oriented. No new focal deficits. Ext: Warm, no deformities. Skin: No new rashes, lesions or ulcers on visualized skin   Assessment & Plan: Acute on chronic HFpEF: Echo this admit stable from previous with hyperdynamic LV, G2DD, mild to moderate AS - Continue higher dose lasix  per nephrology whose assistance is appreciated. UOP still modest as charted. - Palliative care also consulted.    AKI on stage IV CKD:  - Baseline creatinine 3.2-3.5. BUN and Cr remain quite elevated, but no uremic symptoms and slightly improving. Bicarb nl, will supp K now that loop diuretic inducing hypoK. Appreciate nephrology consultation. Pt very clear in her desire to avoid dialysis.    Hypothermia: With no infectious  symptoms. AM cortisol 16.7. TSH wnl at 3.081.  - Monitoring screening blood cultures (NGTD from 5/21) - Remain in SDU - Monitor off abx for now.    T2DM: HbA1c 7.4%. Hypoglycemia this AM after 9u novolog , decreased renal clearance and limited gluconeogenesis.  - Continue basal insulin  at 5u qHS and avoid overcorrection.  - Continue 2u novolog  + sensitive/renal SSI. Some permissive hyperglycemia anticipated.   HTN with HTN urgency:  - Norvasc  increased to 10mg  - Restarted home clonidine  0.3mg  TID - Start scheduled hydralazine  po - HR on lower side, 1st deg AVB on monitor, so not initiating BB at this time. - Continue prn hydralazine , avoid abrupt lowering of BP. Anticipate improvement with diuresis.   Hypervolemic hyponatremia:  - Continue efforts at diuresis, monitoring daily. Asymptomatic.   Anemia of CKD - Anemia panel wnl, though will supp folic acid (low normal at 6.7) - Defer ESA to nephrology.   HLD:  - Statin  Weakness: SNF recommended, though pt and spouse very clearly state she'll be going back home with [facility] support. Continue mobility efforts here.   Wynetta Heckle, MD [facility] [email or web address] 02/08/2024, 11:52 AM

## 2024-02-08 NOTE — Progress Notes (Signed)
 PT Cancellation Note  Patient Details Name: Emily Jennings MRN: [id number] DOB: [birth year]   Cancelled Treatment:    Reason Eval/Treat Not Completed: Medical issues which prohibited therapy. Patient transferred to a higher level of care and will need new PT consult to resume therapy when patient is medically stable.  Thank you.   7:55 AM, 02/08/24 Walton Guppy, MPT Physical Therapist with [facility] [phone] office [phone] mobile phone

## 2024-02-09 DIAGNOSIS — I5033 Acute on chronic diastolic (congestive) heart failure: Secondary | ICD-10-CM | POA: Diagnosis not present

## 2024-02-09 LAB — MAGNESIUM: Magnesium: 2.1 mg/dL (ref 1.7–2.4)

## 2024-02-09 LAB — RENAL FUNCTION PANEL
Albumin: 2.6 g/dL — ABNORMAL LOW (ref 3.5–5.0)
Anion gap: 9 (ref 5–15)
BUN: 89 mg/dL — ABNORMAL HIGH (ref 8–23)
CO2: 29 mmol/L (ref 22–32)
Calcium: 8.5 mg/dL — ABNORMAL LOW (ref 8.9–10.3)
Chloride: 90 mmol/L — ABNORMAL LOW (ref 98–111)
Creatinine, Ser: 3.1 mg/dL — ABNORMAL HIGH (ref 0.44–1.00)
GFR, Estimated: 14 mL/min — ABNORMAL LOW (ref >60)
Glucose, Bld: 75 mg/dL (ref 70–99)
Phosphorus: 4.5 mg/dL (ref 2.5–4.6)
Potassium: 2.9 mmol/L — ABNORMAL LOW (ref 3.5–5.1)
Sodium: 128 mmol/L — ABNORMAL LOW (ref 135–145)

## 2024-02-09 LAB — GLUCOSE, CAPILLARY
Glucose-Capillary: 59 mg/dL — ABNORMAL LOW (ref 70–99)
Glucose-Capillary: 122 mg/dL — ABNORMAL HIGH (ref 70–99)
Glucose-Capillary: 195 mg/dL — ABNORMAL HIGH (ref 70–99)
Glucose-Capillary: 328 mg/dL — ABNORMAL HIGH (ref 70–99)

## 2024-02-09 LAB — POTASSIUM: Potassium: 2.9 mmol/L — ABNORMAL LOW (ref 3.5–5.1)

## 2024-02-09 MED ORDER — POTASSIUM CHLORIDE CRYS ER 20 MEQ PO TBCR
40.0000 meq | EXTENDED_RELEASE_TABLET | Freq: Two times a day (BID) | ORAL | Status: DC
  Administered 2024-02-09 – 2024-02-12 (×7): 40 meq via ORAL
  Filled 2024-02-09 (×7): qty 2

## 2024-02-09 MED ORDER — CLONIDINE HCL 0.1 MG PO TABS
0.3000 mg | ORAL_TABLET | Freq: Three times a day (TID) | ORAL | Status: DC
  Administered 2024-02-09 – 2024-02-13 (×13): 0.3 mg via ORAL
  Filled 2024-02-09: qty 1
  Filled 2024-02-09: qty 3
  Filled 2024-02-09 (×5): qty 1
  Filled 2024-02-09: qty 3
  Filled 2024-02-09 (×3): qty 1
  Filled 2024-02-09: qty 3
  Filled 2024-02-09 (×2): qty 1

## 2024-02-09 MED ORDER — FUROSEMIDE 10 MG/ML IJ SOLN
120.0000 mg | Freq: Three times a day (TID) | INTRAVENOUS | Status: DC
  Administered 2024-02-09 – 2024-02-11 (×6): 120 mg via INTRAVENOUS
  Filled 2024-02-09 (×9): qty 12

## 2024-02-09 MED ORDER — POTASSIUM CHLORIDE 10 MEQ/100ML IV SOLN
10.0000 meq | INTRAVENOUS | Status: AC
  Administered 2024-02-09 (×4): 10 meq via INTRAVENOUS
  Filled 2024-02-09 (×4): qty 100

## 2024-02-09 MED ORDER — HYDRALAZINE HCL 25 MG PO TABS
100.0000 mg | ORAL_TABLET | Freq: Three times a day (TID) | ORAL | Status: DC
  Administered 2024-02-09 – 2024-02-13 (×12): 100 mg via ORAL
  Filled 2024-02-09: qty 4
  Filled 2024-02-09 (×10): qty 2
  Filled 2024-02-09: qty 4

## 2024-02-09 NOTE — Plan of Care (Signed)

## 2024-02-09 NOTE — Progress Notes (Signed)
 Latest Reference Range & Units 02/09/24 20:11  Glucose-Capillary 70 - 99 mg/dL 253 (H)  (H): Data is abnormally high  This RN reached out to overnight coverage Dr Amy Kansky regarding hyperglycemia. Patient had been experiencing  hypoglycemia in the AM's so per Dr. Amy Kansky no further orders at this time. [place] RN

## 2024-02-09 NOTE — Progress Notes (Signed)
 [facility] PROGRESS NOTE  Emily WRAGG (DOB: 1934/05/14) WJX:[id number] PCP: Towanda Fret, MD  Brief Narrative: Emily Jennings is an 89 y.o. female with a history of HFpEF, stage IV CKD, CAD who presented to the ED on 02/04/2024 with dyspnea, leg swelling, orthopnea worsening since recent discharge as she was unable to obtain torsemide  prescribed at that time. In the ED she had evidence of interstitial pulmonary edema and volume overload. Nephrology was consulted for assistance with diuresing the patient and she was admitted for acute on chronic HFpEF with cardiorenal syndrome. Transferred to SDU 5/21 due to hypothermia in the absence of infectious nidus. Palliative care is following and the patient has set limits on her care declining dialysis and she is DNR.  Subjective: Feels tired and requests time to rest but plans on getting up OOB again today, has been feeling a bit better day by day. No chest pain or headache currently.   Objective: BP (!) 212/64   Pulse 63   Temp 97.9 F (36.6 C)   Resp 14   Ht 5' (1.524 m)   Wt 60.8 kg   SpO2 99%   BMI 26.18 kg/m   Gen: Pleasant, elderly frail female in no distress Pulm: Clear, nonlabored  CV: RRR, improving, now 2+ dependent edema with cutaneous evidence of volume contraction.  GI: Soft, NT, ND, +BS  Neuro: Alert and oriented. No new focal deficits. Ext: Warm, no deformities. Skin: No new rashes, lesions or ulcers on visualized skin   Assessment & Plan: Acute on chronic HFpEF: Echo this admit stable from previous with hyperdynamic LV, G2DD, mild to moderate AS - Continue lasix  120mg  q8h per nephrology whose assistance is appreciated. UOP picking up, -2.5L yesterday. Remains volume up, but improving. - Palliative care also consulted.    AKI on stage IV CKD:  - Baseline creatinine 3.2-3.5. Appreciate nephrology consultation. Pt very clear in her desire to avoid dialysis.   Hypokalemia:  - Augment supplementation to 40mEq  BID, able to take po. Continue monitoring this PM   Hypothermia: With no infectious symptoms. AM cortisol 16.7. TSH wnl at 3.081.  - Monitoring screening blood cultures (NGTD from 5/21) - Can transfer back to floor. - Monitor off abx for now.    T2DM: HbA1c 7.4%. Hypoglycemia this AM after 9u novolog , decreased renal clearance and limited gluconeogenesis.  - Fasting hypoglycemia, stop basal insulin  for now. - Continue 4u novolog  + sensitive/renal SSI.    HTN with HTN urgency:  - Norvasc  increased to 10mg  - Restarted home clonidine  0.3mg , increased to TID - Continue new scheduled hydralazine  po - HR on lower side, 1st deg AVB on monitor, so not initiating BB at this time. - Continue prn hydralazine , avoid abrupt lowering of BP. Anticipate improvement with diuresis.   Hypervolemic hyponatremia:  - Continue efforts at diuresis, monitoring daily. Asymptomatic.   Anemia of CKD - Anemia panel wnl, though will supp folic acid (low normal at 6.7) - Defer ESA to nephrology.   HLD:  - Statin  Weakness: SNF recommended, though pt and spouse very clearly state she'll be going back home with [facility] support. Continue mobility efforts here.   Emily Heckle, MD [facility] [email or web address] 02/09/2024, 10:32 AM

## 2024-02-10 DIAGNOSIS — I5033 Acute on chronic diastolic (congestive) heart failure: Secondary | ICD-10-CM | POA: Diagnosis not present

## 2024-02-10 LAB — RENAL FUNCTION PANEL
Albumin: 2.7 g/dL — ABNORMAL LOW (ref 3.5–5.0)
Anion gap: 14 (ref 5–15)
BUN: 85 mg/dL — ABNORMAL HIGH (ref 8–23)
CO2: 24 mmol/L (ref 22–32)
Calcium: 8.5 mg/dL — ABNORMAL LOW (ref 8.9–10.3)
Chloride: 86 mmol/L — ABNORMAL LOW (ref 98–111)
Creatinine, Ser: 3.05 mg/dL — ABNORMAL HIGH (ref 0.44–1.00)
GFR, Estimated: 14 mL/min — ABNORMAL LOW (ref >60)
Glucose, Bld: 465 mg/dL — ABNORMAL HIGH (ref 70–99)
Phosphorus: 4.3 mg/dL (ref 2.5–4.6)
Potassium: 4.1 mmol/L (ref 3.5–5.1)
Sodium: 124 mmol/L — ABNORMAL LOW (ref 135–145)

## 2024-02-10 LAB — GLUCOSE, CAPILLARY
Glucose-Capillary: 511 mg/dL (ref 70–99)
Glucose-Capillary: 566 mg/dL (ref 70–99)
Glucose-Capillary: 439 mg/dL — ABNORMAL HIGH (ref 70–99)
Glucose-Capillary: 437 mg/dL — ABNORMAL HIGH (ref 70–99)
Glucose-Capillary: 326 mg/dL — ABNORMAL HIGH (ref 70–99)
Glucose-Capillary: 91 mg/dL (ref 70–99)

## 2024-02-10 MED ORDER — MIDAZOLAM HCL 2 MG/2ML IJ SOLN
INTRAMUSCULAR | Status: AC
Start: 2024-02-10 — End: 2024-02-11
  Filled 2024-02-10: qty 2

## 2024-02-10 MED ORDER — ETOMIDATE 2 MG/ML IV SOLN
INTRAVENOUS | Status: AC
Start: 2024-02-10 — End: 2024-02-11
  Filled 2024-02-10: qty 20

## 2024-02-10 MED ORDER — ROCURONIUM BROMIDE 10 MG/ML (PF) SYRINGE
PREFILLED_SYRINGE | INTRAVENOUS | Status: AC
  Filled 2024-02-10: qty 10

## 2024-02-10 MED ORDER — SUCCINYLCHOLINE CHLORIDE 200 MG/10ML IV SOSY
PREFILLED_SYRINGE | INTRAVENOUS | Status: AC
  Filled 2024-02-10: qty 10

## 2024-02-10 NOTE — Progress Notes (Signed)
 [facility] PROGRESS NOTE  Emily Jennings (DOB: 1933/12/14) ZOX:[id number] PCP: Towanda Fret, MD  Brief Narrative: Emily Jennings is an 89 y.o. female with a history of HFpEF, stage IV CKD, CAD who presented to the ED on 02/04/2024 with dyspnea, leg swelling, orthopnea worsening since recent discharge as she was unable to obtain torsemide  prescribed at that time. In the ED she had evidence of interstitial pulmonary edema and volume overload. Nephrology was consulted for assistance with diuresing the patient and she was admitted for acute on chronic HFpEF with cardiorenal syndrome. Transferred to SDU 5/21 due to hypothermia in the absence of infectious nidus. Palliative care is following and the patient has set limits on her care declining dialysis and she is DNR.  Subjective: Didn't sleep well, not hungry, up in chair this morning. High blood sugars, eating breakfast well.   Objective: BP (!) 177/77   Pulse (!) 108   Temp 98.6 F (37 C) (Oral)   Resp 18   Ht 5' (1.524 m)   Wt 60.8 kg   SpO2 98%   BMI 26.18 kg/m   Gen: Elderly pleasant female in no distress Pulm: Clear and nonlabored  CV: RRR, no MRG, 2+ LE edema to thigh GI: Soft, NT, ND, +BS  Neuro: Alert and oriented. No new focal deficits. Ext: Warm, no deformities Skin: No new rashes, lesions or ulcers on visualized skin   Assessment & Plan: Acute on chronic HFpEF: Echo this admit stable from previous with hyperdynamic LV, G2DD, mild to moderate AS - Continue lasix  120mg  q8h per nephrology whose assistance is appreciated. UOP picking up, ~-2.5L again yesterday. Remains volume up, but improving. Maintain negative balance.  - Palliative care also consulted.    AKI on stage IV CKD:  - Baseline creatinine 3.2-3.5. Appreciate nephrology consultation. Pt very clear in her desire to avoid dialysis. Cr actually improving better than/at lower limit of putative baseline.   Hypokalemia:  - Continue supplementation 40mEq BID  with ongoing high dose lasix . K up into normal range with severe hyperglycemia this morning. Continue monitoring.   Hypothermia: With no infectious symptoms. AM cortisol 16.7. TSH wnl at 3.081.  - Monitoring screening blood cultures (NGTD from 5/21) - Can transfer back to floor. - Monitor off abx for now.    T2DM: HbA1c 7.4%.  - Restart low dose basal insulin  and continue 4u novolog  + sensitive/renal SSI.  Severely hyperglycemic this AM but has quickly gone drastically low in the past with modest doses of insulin . Will aim for improvement throughout the day.   HTN with HTN urgency:  - Norvasc  increased to 10mg  - Restarted home clonidine  0.3mg , increased to TID - Continue new scheduled hydralazine  po, increase dose - HR on lower side, 1st deg AVB on monitor, so not initiating BB at this time. - Continue prn hydralazine , avoid abrupt lowering of BP. Anticipate improvement with diuresis.   Hypervolemic hyponatremia:  - Continue efforts at diuresis, monitoring daily. Asymptomatic.   Anemia of CKD - Anemia panel wnl, though will supp folic acid (low normal at 6.7) - Defer ESA to nephrology.   HLD:  - Statin  Weakness: SNF recommended, though pt and spouse very clearly state she'll be going back home with [facility] support. Continue mobility efforts here.   Wynetta Heckle, MD [facility] [email or web address] 02/10/2024, 12:06 PM

## 2024-02-10 NOTE — Plan of Care (Signed)
   Problem: Activity: Goal: Risk for activity intolerance will decrease Outcome: Progressing   Problem: Nutrition: Goal: Adequate nutrition will be maintained Outcome: Progressing   Problem: Coping: Goal: Level of anxiety will decrease Outcome: Progressing

## 2024-02-11 DIAGNOSIS — I5033 Acute on chronic diastolic (congestive) heart failure: Secondary | ICD-10-CM | POA: Diagnosis not present

## 2024-02-11 LAB — GLUCOSE, CAPILLARY
Glucose-Capillary: 177 mg/dL — ABNORMAL HIGH (ref 70–99)
Glucose-Capillary: 164 mg/dL — ABNORMAL HIGH (ref 70–99)
Glucose-Capillary: 126 mg/dL — ABNORMAL HIGH (ref 70–99)
Glucose-Capillary: 20 mg/dL — CL (ref 70–99)
Glucose-Capillary: 49 mg/dL — ABNORMAL LOW (ref 70–99)
Glucose-Capillary: 83 mg/dL (ref 70–99)

## 2024-02-11 LAB — CULTURE, BLOOD (ROUTINE X 2)
Culture: NO GROWTH
Culture: NO GROWTH
Special Requests: ADEQUATE

## 2024-02-11 LAB — RENAL FUNCTION PANEL
Albumin: 2.5 g/dL — ABNORMAL LOW (ref 3.5–5.0)
Anion gap: 6 (ref 5–15)
BUN: 85 mg/dL — ABNORMAL HIGH (ref 8–23)
CO2: 31 mmol/L (ref 22–32)
Calcium: 8.7 mg/dL — ABNORMAL LOW (ref 8.9–10.3)
Chloride: 91 mmol/L — ABNORMAL LOW (ref 98–111)
Creatinine, Ser: 2.82 mg/dL — ABNORMAL HIGH (ref 0.44–1.00)
GFR, Estimated: 16 mL/min — ABNORMAL LOW (ref >60)
Glucose, Bld: 89 mg/dL (ref 70–99)
Phosphorus: 3.8 mg/dL (ref 2.5–4.6)
Potassium: 3.9 mmol/L (ref 3.5–5.1)
Sodium: 128 mmol/L — ABNORMAL LOW (ref 135–145)

## 2024-02-11 MED ORDER — TORSEMIDE 20 MG PO TABS
60.0000 mg | ORAL_TABLET | Freq: Two times a day (BID) | ORAL | Status: DC
  Administered 2024-02-11 – 2024-02-13 (×4): 60 mg via ORAL
  Filled 2024-02-11 (×4): qty 3

## 2024-02-11 NOTE — Progress Notes (Signed)
 Patient ID: Emily Jennings, female   DOB: 20-Dec-1933, 89 y.o.   MRN: [id number] S:  Tired, no complaints Continue to diurese reasonably well over the weekend on furosemide  120 mg every 8 hours; Down 7.7 L and approximately 3 kg Creatinine stable at 2.8, in fact continues to slowly improve.  Sodium 128, stable low.  K3.9. Edema appears much improved.  Blood pressures remain elevated.  O:BP (!) 188/62   Pulse 66   Temp 97.9 F (36.6 C) (Oral)   Resp 16   Ht 5' (1.524 m)   Wt 58 kg   SpO2 98%   BMI 24.97 kg/m   Intake/Output Summary (Last 24 hours) at 02/11/2024 0941 Last data filed at 02/11/2024 0500 Gross per 24 hour  Intake 147.96 ml  Output 1600 ml  Net -1452.04 ml   Intake/Output: I/O last 3 completed shifts: In: 758.8 [P.O.:480; IV Piggyback:278.8] Out: 4250 [Urine:4250]  Intake/Output this shift:  No intake/output data recorded. Weight change:  Gen: NAD CVS: RRR Resp:CTA Abd:+BS, soft, NT/ND Ext: no to trace edema of bilateral lower ext to presacral region  Recent Labs  Lab 02/04/24 2121 02/06/24 0322 02/07/24 0421 02/08/24 0419 02/09/24 0314 02/09/24 1317 02/10/24 0323 02/11/24 0242  NA 125* 127* 125* 126* 128*  --  124* 128*  K 4.4 4.3 3.2* 2.9* 2.9* 2.9* 4.1 3.9  CL 89* 92* 88* 90* 90*  --  86* 91*  CO2 23 20* 28 28 29   --  24 31  GLUCOSE 173* 131* 53* 109* 75  --  465* 89  BUN 109* 110* 104* 95* 89*  --  85* 85*  CREATININE 3.90* 4.06* 3.64* 3.30* 3.10*  --  3.05* 2.82*  ALBUMIN  2.9* 2.8* 3.0* 2.7* 2.6*  --  2.7* 2.5*  CALCIUM  8.6* 8.2* 8.2* 8.2* 8.5*  --  8.5* 8.7*  PHOS  --  6.1* 5.1* 4.8* 4.5  --  4.3 3.8  AST 21  --   --   --   --   --   --   --   ALT 14  --   --   --   --   --   --   --    Liver Function Tests: Recent Labs  Lab 02/04/24 2121 02/06/24 0322 02/09/24 0314 02/10/24 0323 02/11/24 0242  AST 21  --   --   --   --   ALT 14  --   --   --   --   ALKPHOS 59  --   --   --   --   BILITOT 0.5  --   --   --   --   PROT 5.0*  --   --    --   --   ALBUMIN  2.9*   < > 2.6* 2.7* 2.5*   < > = values in this interval not displayed.   No results for input(s): "LIPASE", "AMYLASE" in the last 168 hours. No results for input(s): "AMMONIA" in the last 168 hours. CBC: Recent Labs  Lab 02/04/24 1653 02/05/24 0517 02/06/24 0322 02/07/24 0421  WBC 4.3 4.0 3.9* 4.7  NEUTROABS  --   --   --  3.3  HGB 7.9* 8.0* 8.0* 8.4*  HCT 23.0* 23.4* 23.8* 25.3*  MCV 92.7 92.1 92.6 91.7  PLT 212 202 202 194   Cardiac Enzymes: No results for input(s): "CKTOTAL", "CKMB", "CKMBINDEX", "TROPONINI" in the last 168 hours. CBG: Recent Labs  Lab 02/10/24 [phone] 02/10/24 1137 02/10/24 1630  02/10/24 2146 02/11/24 0735  GLUCAP 439* 437* 326* 91 177*    Iron Studies:  No results for input(s): "IRON", "TIBC", "TRANSFERRIN", "FERRITIN" in the last 72 hours.  Studies/Results: No results found.   amLODipine   10 mg Oral Daily   Chlorhexidine  Gluconate Cloth  6 each Topical Daily   cloNIDine   0.3 mg Oral TID   folic acid  1 mg Oral Daily   heparin   5,000 Units Subcutaneous Q8H   hydrALAZINE   100 mg Oral Q8H   insulin  aspart  0-9 Units Subcutaneous TID WC   insulin  aspart  4 Units Subcutaneous TID WC   potassium chloride   40 mEq Oral BID    BMET    Component Value Date/Time   NA 128 (L) 02/11/2024 0242   NA 134 11/28/2023 1307   K 3.9 02/11/2024 0242   CL 91 (L) 02/11/2024 0242   CO2 31 02/11/2024 0242   GLUCOSE 89 02/11/2024 0242   BUN 85 (H) 02/11/2024 0242   BUN 61 (H) 11/28/2023 1307   CREATININE 2.82 (H) 02/11/2024 0242   CREATININE 1.70 (H) 02/01/2023 1356   CALCIUM  8.7 (L) 02/11/2024 0242   GFRNONAA 16 (L) 02/11/2024 0242   GFRNONAA 40 (L) 06/29/2020 0916   GFRAA 46 (L) 06/29/2020 0916   CBC    Component Value Date/Time   WBC 4.7 02/07/2024 0421   RBC 2.76 (L) 02/07/2024 0421   HGB 8.4 (L) 02/07/2024 0421   HGB 9.2 (L) 07/27/2023 0915   HCT 25.3 (L) 02/07/2024 0421   HCT 28.2 (L) 07/27/2023 0915   PLT 194 02/07/2024  0421   PLT 256 07/27/2023 0915   MCV 91.7 02/07/2024 0421   MCV 96 07/27/2023 0915   MCH 30.4 02/07/2024 0421   MCHC 33.2 02/07/2024 0421   RDW 13.6 02/07/2024 0421   RDW 12.6 07/27/2023 0915   LYMPHSABS 0.9 02/07/2024 0421   LYMPHSABS 0.7 07/16/2023 0921   MONOABS 0.5 02/07/2024 0421   EOSABS 0.0 02/07/2024 0421   EOSABS 0.0 07/16/2023 0921   BASOSABS 0.0 02/07/2024 0421   BASOSABS 0.0 07/16/2023 4696      Assessment/Plan:   AKI/CKD stage III - in setting of acute on chronic HFpEF consistent with recurrent cardiorenal syndrome.  Returned with significant volume overload which has responded to increasing dosing frequency.  Creatinine improving with decongestion.  Diuresing reasonably well.  Go ahead and transition to torsemide  60 mg by mouth twice daily and see how she does in the next 24 hours.  Working with palliative.  Has chosen not to receive HD now or in the future. Acute on chronic HFpEF - due to lack of torsemide  after discharge.  As above.  ECHO per primary svc which again showed grade II DD.  Anemia of CKD stage III - TSAT 17% and low iron.  Dosed with IV iron.  Give ESA once blood pressures improved HTN with HTN urgency -remains elevated, asymptomatic.  On amlodipine , hydralazine , and clonidine .  Follow with volume unloading.  Pulse in the 50s to 60s limiting nodal agents.  Low GFR limits use of RAAS inhibitors.  Consider alpha-blocker vs ongoing monitoring with adjustment of diuretics. Hypervolemic hyponatremia -improving slowly with diuresis.  Currently asymptomatic.  DM type 2 - per primary  Charletta Cons, MD  [facility]

## 2024-02-11 NOTE — Progress Notes (Signed)
 [facility] PROGRESS NOTE  Emily Jennings (DOB: 06/01/1934) [id number] PCP: Towanda Fret, MD  Brief Narrative: Emily Jennings is an 89 y.o. female with a history of HFpEF, stage IV CKD, CAD who presented to the ED on 02/04/2024 with dyspnea, leg swelling, orthopnea worsening since recent discharge as she was unable to obtain torsemide  prescribed at that time. In the ED she had evidence of interstitial pulmonary edema and volume overload. Nephrology was consulted for assistance with diuresing the patient and she was admitted for acute on chronic HFpEF with cardiorenal syndrome. Transferred to SDU 5/21 due to hypothermia in the absence of infectious nidus. Palliative care is following and the patient has set limits on her care declining dialysis and she is DNR.  Subjective: Tired of taking so many pills, particularly large potassium pill. Feels better, swelling improved.   Objective: BP (!) 188/62   Pulse 66   Temp 97.9 F (36.6 C) (Oral)   Resp 16   Ht 5' (1.524 m)   Wt 58 kg   SpO2 98%   BMI 24.97 kg/m   Gen: No distress, elderly and exceedingly pleasant Pulm: Clear, nonlabored  CV: RRR, no MRG, 1+ edema to thigh GI: Soft, NT, ND, +BS  Neuro: Alert and oriented. No new focal deficits. Ext: Warm, no deformities. Skin: No rashes, lesions or ulcers on visualized skin   Assessment & Plan: Acute on chronic HFpEF: Echo this admit stable from previous with hyperdynamic LV, G2DD, mild to moderate AS - Continue diuresis, transition to torsemide  in hopes that diuresis continues and could be discharged on this. - Palliative care also consulted.    AKI on stage IV CKD:  - Baseline creatinine thought to be 3.2-3.5. Appreciate nephrology consultation. Pt very clear in her desire to avoid dialysis. Cr actually improving better than/at lower limit of putative baseline. Dx stage IV CKD remains correct  Hypokalemia:  - Continue supplementation     Hypothermia: With no infectious  symptoms. AM cortisol 16.7. TSH wnl at 3.081.  - Monitoring screening blood cultures (NGTD from 5/21) - Can transfer back to floor. - Monitor off abx for now.    T2DM: HbA1c 7.4%.  - Restart low dose basal insulin  and continue 4u novolog  + sensitive/renal SSI.  Severely hyperglycemic this AM but has quickly gone drastically low in the past with modest doses of insulin . Will aim for improvement throughout the day.   HTN with HTN urgency:  - Continue norvasc  10mg  daily, clonidine  0.3mg  TID, hydralazine  TID. - HR on lower side, 1st deg AVB on monitor, so not initiating BB at this time. - Continue prn hydralazine , avoid abrupt lowering of BP. Anticipate improvement with diuresis.   Hypervolemic hyponatremia:  - Continue efforts at diuresis, monitoring daily. Asymptomatic.   Anemia of CKD - Anemia panel wnl, though will supp folic acid (low normal at 6.7) - Defer ESA to nephrology.   HLD:  - Statin  Weakness: SNF recommended, though pt and spouse very clearly state she'll be going back home with [facility] support. Continue mobility efforts here.   Emily Heckle, MD [facility] [email or web address] 02/11/2024, 9:49 AM

## 2024-02-11 NOTE — Progress Notes (Addendum)
 Hypoglycemic Event   CBG: 20 @ 2118 Treatment:  8oz juice Symptoms:  new onset agitation Follow-up CBG: 49 @ 2143 Treatment: peanut butter crackers Follow-up CBG: 83 @ 2212 Possible Reasons for Event: Unknown

## 2024-02-11 NOTE — Plan of Care (Signed)
  Problem: Education: Goal: Knowledge of General Education information will improve Description: Including pain rating scale, medication(s)/side effects and non-pharmacologic comfort measures Outcome: Progressing   Problem: Health Behavior/Discharge Planning: Goal: Ability to manage health-related needs will improve Outcome: Progressing   Problem: Clinical Measurements: Goal: Ability to maintain clinical measurements within normal limits will improve Outcome: Progressing Goal: Will remain free from infection Outcome: Progressing Goal: Diagnostic test results will improve Outcome: Progressing Goal: Respiratory complications will improve Outcome: Progressing Goal: Cardiovascular complication will be avoided Outcome: Progressing   Problem: Activity: Goal: Risk for activity intolerance will decrease Outcome: Progressing   Problem: Nutrition: Goal: Adequate nutrition will be maintained Outcome: Progressing   Problem: Coping: Goal: Level of anxiety will decrease Outcome: Progressing   Problem: Elimination: Goal: Will not experience complications related to bowel motility Outcome: Progressing Goal: Will not experience complications related to urinary retention Outcome: Progressing   Problem: Pain Managment: Goal: General experience of comfort will improve and/or be controlled Outcome: Progressing   Problem: Safety: Goal: Ability to remain free from injury will improve Outcome: Progressing   Problem: Skin Integrity: Goal: Risk for impaired skin integrity will decrease Outcome: Progressing   Problem: Education: Goal: Ability to demonstrate management of disease process will improve Outcome: Progressing Goal: Ability to verbalize understanding of medication therapies will improve Outcome: Progressing Goal: Individualized Educational Video(s) Outcome: Progressing   Problem: Activity: Goal: Capacity to carry out activities will improve Outcome: Progressing    Problem: Cardiac: Goal: Ability to achieve and maintain adequate cardiopulmonary perfusion will improve Outcome: Progressing   Problem: Education: Goal: Ability to describe self-care measures that may prevent or decrease complications (Diabetes Survival Skills Education) will improve Outcome: Progressing Goal: Individualized Educational Video(s) Outcome: Progressing   Problem: Coping: Goal: Ability to adjust to condition or change in health will improve Outcome: Progressing   Problem: Fluid Volume: Goal: Ability to maintain a balanced intake and output will improve Outcome: Progressing   Problem: Health Behavior/Discharge Planning: Goal: Ability to identify and utilize available resources and services will improve Outcome: Progressing Goal: Ability to manage health-related needs will improve Outcome: Progressing   Problem: Metabolic: Goal: Ability to maintain appropriate glucose levels will improve Outcome: Progressing   Problem: Nutritional: Goal: Maintenance of adequate nutrition will improve Outcome: Progressing Goal: Progress toward achieving an optimal weight will improve Outcome: Progressing   Problem: Skin Integrity: Goal: Risk for impaired skin integrity will decrease Outcome: Progressing   Problem: Tissue Perfusion: Goal: Adequacy of tissue perfusion will improve Outcome: Progressing   Problem: Education: Goal: Ability to demonstrate management of disease process will improve Outcome: Progressing Goal: Ability to verbalize understanding of medication therapies will improve Outcome: Progressing Goal: Individualized Educational Video(s) Outcome: Progressing   Problem: Activity: Goal: Capacity to carry out activities will improve Outcome: Progressing   Problem: Cardiac: Goal: Ability to achieve and maintain adequate cardiopulmonary perfusion will improve Outcome: Progressing

## 2024-02-12 DIAGNOSIS — I5033 Acute on chronic diastolic (congestive) heart failure: Secondary | ICD-10-CM | POA: Diagnosis not present

## 2024-02-12 LAB — RENAL FUNCTION PANEL
Albumin: 2.5 g/dL — ABNORMAL LOW (ref 3.5–5.0)
Anion gap: 8 (ref 5–15)
BUN: 86 mg/dL — ABNORMAL HIGH (ref 8–23)
CO2: 29 mmol/L (ref 22–32)
Calcium: 8.6 mg/dL — ABNORMAL LOW (ref 8.9–10.3)
Chloride: 90 mmol/L — ABNORMAL LOW (ref 98–111)
Creatinine, Ser: 2.77 mg/dL — ABNORMAL HIGH (ref 0.44–1.00)
GFR, Estimated: 16 mL/min — ABNORMAL LOW (ref >60)
Glucose, Bld: 241 mg/dL — ABNORMAL HIGH (ref 70–99)
Phosphorus: 4.2 mg/dL (ref 2.5–4.6)
Potassium: 4.2 mmol/L (ref 3.5–5.1)
Sodium: 127 mmol/L — ABNORMAL LOW (ref 135–145)

## 2024-02-12 LAB — GLUCOSE, CAPILLARY
Glucose-Capillary: 362 mg/dL — ABNORMAL HIGH (ref 70–99)
Glucose-Capillary: 317 mg/dL — ABNORMAL HIGH (ref 70–99)
Glucose-Capillary: 134 mg/dL — ABNORMAL HIGH (ref 70–99)
Glucose-Capillary: 116 mg/dL — ABNORMAL HIGH (ref 70–99)

## 2024-02-12 MED ORDER — POTASSIUM CHLORIDE CRYS ER 20 MEQ PO TBCR
40.0000 meq | EXTENDED_RELEASE_TABLET | Freq: Every day | ORAL | Status: DC
  Administered 2024-02-13: 40 meq via ORAL
  Filled 2024-02-12: qty 2

## 2024-02-12 NOTE — Plan of Care (Signed)
   Problem: Health Behavior/Discharge Planning: Goal: Ability to manage health-related needs will improve Outcome: Progressing   Problem: Clinical Measurements: Goal: Ability to maintain clinical measurements within normal limits will improve Outcome: Progressing Goal: Diagnostic test results will improve Outcome: Progressing

## 2024-02-12 NOTE — Progress Notes (Signed)
 [facility] PROGRESS NOTE  Emily Jennings (DOB: 1933-11-19) MVH:[id number] PCP: Emily Fret, MD  Brief Narrative: Emily Jennings is an 89 y.o. female with a history of HFpEF, stage IV CKD, CAD who presented to the ED on 02/04/2024 with dyspnea, leg swelling, orthopnea worsening since recent discharge as she was unable to obtain torsemide  prescribed at that time. In the ED she had evidence of interstitial pulmonary edema and volume overload. Nephrology was consulted for assistance with diuresing the patient and she was admitted for acute on chronic HFpEF with cardiorenal syndrome. Transferred to SDU 5/21 due to hypothermia in the absence of infectious nidus which has resolved spontaneously. Palliative care is following and the patient has set limits on her care declining dialysis and she is DNR.  With high dose IV lasix , she has diuresed well with improving creatinine. BP medications have been titrated upward, though the patient wishes to limit further addition of medications. Transitioned to torsemide  5/26. Remains volume overloaded, though with goals of care discussions, we may end up discharging home soon (instead of SNF as recommended by PT).   Subjective: Starting to feel worn out from being awakened so often. Ate 15% of dinner but was given meal coverage anyway, with resulting glucose very low now coming up with po intake. Told nephrology she doesn't want additional blood pressure medications.  Objective: BP (!) 180/49   Pulse 73   Temp 98.4 F (36.9 C) (Oral)   Resp 11   Ht 5' (1.524 m)   Wt 58 kg   SpO2 97%   BMI 24.97 kg/m   Gen: Elderly pleasant female in no distress Pulm: Clear, nonlabored  CV: RRR, no MRG, 1+ pitting LE edema to thigh.  GI: Soft, NT, ND, +BS Neuro: Alert and oriented. No new focal deficits. Ext: Warm, no deformities. Skin: No new rashes, lesions or ulcers on visualized skin   Assessment & Plan: Acute on chronic HFpEF: Echo this admit stable from  previous with hyperdynamic LV, G2DD, mild to moderate AS - Continue diuresis, transitioned to torsemide  and stable creatinine and volume status, though remains overloaded. Given our goals of care discussions, the patient may opt to discharge home prior to euvolemia, but will monitor another day.  - Palliative care also consulted.    AKI on stage IV CKD:  - Baseline creatinine thought to be 3.2-3.5. Appreciate nephrology consultation. Pt very clear in her desire to avoid dialysis. Cr actually improving better than/at lower limit of putative baseline. Dx stage IV CKD remains correct  Hypokalemia:  - Continue supplementation, with deescalation of diuretic, will decrease from 40 BID to daily.   Hypothermia: With no infectious symptoms. AM cortisol 16.7. TSH wnl at 3.081.  - Monitoring screening blood cultures (NGTD from 5/21) - Can transfer back to floor. - Monitor off abx for now.    T2DM: HbA1c 7.4%.  - Very labile CBGs. Urged staff not to give mealtime novolog  if not taking 50% or more of a meal. Continue regimen as ordered, monitor with hypoglycemic protocols.    HTN with HTN urgency:  - Continue norvasc  10mg  daily, clonidine  0.3mg  TID, hydralazine  TID. - HR on lower side, 1st deg AVB on monitor, so not initiating BB at this time. - Continue prn hydralazine , avoid abrupt lowering of BP. Anticipate improvement with diuresis. Declines additional medications.  Hypervolemic hyponatremia:  - Continue efforts at diuresis, monitoring daily. Asymptomatic.   Anemia of CKD - Anemia panel wnl, though will supp folic acid  (low normal  at 6.7) - Defer ESA to nephrology.   HLD:  - Statin  Weakness: SNF recommended, though pt and spouse very clearly state she'll be going back home with [facility] support. Continue mobility efforts here.   Wynetta Heckle, MD [facility] [email or web address] 02/12/2024, 11:09 AM

## 2024-02-12 NOTE — Progress Notes (Signed)
 Patient ID: Emily Jennings, female   DOB: 06-24-1934, 89 y.o.   MRN: [id number] S:  Patient states she is tired of getting stuck.  Otherwise continues to feel short of breath but unable to say if it is better or worse.  Denies other complaints. Creatinine stable, good urine output, blood pressure remains elevated, no new weights recorded  O:BP (!) 180/49   Pulse 73   Temp 98.4 F (36.9 C) (Oral)   Resp 11   Ht 5' (1.524 m)   Wt 58 kg   SpO2 97%   BMI 24.97 kg/m   Intake/Output Summary (Last 24 hours) at 02/12/2024 0851 Last data filed at 02/12/2024 0600 Gross per 24 hour  Intake 560 ml  Output 2100 ml  Net -1540 ml   Intake/Output: I/O last 3 completed shifts: In: 708 [P.O.:560; IV Piggyback:148] Out: 3200 [Urine:3200]  Intake/Output this shift:  No intake/output data recorded. Weight change:  Gen: NAD, sitting up in bed CVS: Normal rate with no rub Resp: Bilateral chest rise with no increased work of breathing  abd:+BS, soft, NT/ND Ext: 1+ edema at the bilateral ankles, warm and well-perfused  Recent Labs  Lab 02/06/24 0322 02/07/24 0421 02/08/24 0419 02/09/24 0314 02/09/24 1317 02/10/24 0323 02/11/24 0242 02/12/24 0420  NA 127* 125* 126* 128*  --  124* 128* 127*  K 4.3 3.2* 2.9* 2.9* 2.9* 4.1 3.9 4.2  CL 92* 88* 90* 90*  --  86* 91* 90*  CO2 20* 28 28 29   --  24 31 29   GLUCOSE 131* 53* 109* 75  --  465* 89 241*  BUN 110* 104* 95* 89*  --  85* 85* 86*  CREATININE 4.06* 3.64* 3.30* 3.10*  --  3.05* 2.82* 2.77*  ALBUMIN  2.8* 3.0* 2.7* 2.6*  --  2.7* 2.5* 2.5*  CALCIUM  8.2* 8.2* 8.2* 8.5*  --  8.5* 8.7* 8.6*  PHOS 6.1* 5.1* 4.8* 4.5  --  4.3 3.8 4.2   Liver Function Tests: Recent Labs  Lab 02/10/24 0323 02/11/24 0242 02/12/24 0420  ALBUMIN  2.7* 2.5* 2.5*   No results for input(s): "LIPASE", "AMYLASE" in the last 168 hours. No results for input(s): "AMMONIA" in the last 168 hours. CBC: Recent Labs  Lab 02/06/24 0322 02/07/24 0421  WBC 3.9* 4.7   NEUTROABS  --  3.3  HGB 8.0* 8.4*  HCT 23.8* 25.3*  MCV 92.6 91.7  PLT 202 194   Cardiac Enzymes: No results for input(s): "CKTOTAL", "CKMB", "CKMBINDEX", "TROPONINI" in the last 168 hours. CBG: Recent Labs  Lab 02/11/24 1604 02/11/24 2118 02/11/24 2143 02/11/24 2212 02/12/24 0728  GLUCAP 126* 20* 49* 83 362*    Iron Studies:  No results for input(s): "IRON", "TIBC", "TRANSFERRIN", "FERRITIN" in the last 72 hours.  Studies/Results: No results found.   amLODipine   10 mg Oral Daily   Chlorhexidine  Gluconate Cloth  6 each Topical Daily   cloNIDine   0.3 mg Oral TID   folic acid  1 mg Oral Daily   heparin   5,000 Units Subcutaneous Q8H   hydrALAZINE   100 mg Oral Q8H   insulin  aspart  0-9 Units Subcutaneous TID WC   insulin  aspart  4 Units Subcutaneous TID WC   potassium chloride   40 mEq Oral BID   torsemide   60 mg Oral BID    BMET    Component Value Date/Time   NA 127 (L) 02/12/2024 0420   NA 134 11/28/2023 1307   K 4.2 02/12/2024 0420   CL 90 (L)  02/12/2024 0420   CO2 29 02/12/2024 0420   GLUCOSE 241 (H) 02/12/2024 0420   BUN 86 (H) 02/12/2024 0420   BUN 61 (H) 11/28/2023 1307   CREATININE 2.77 (H) 02/12/2024 0420   CREATININE 1.70 (H) 02/01/2023 1356   CALCIUM  8.6 (L) 02/12/2024 0420   GFRNONAA 16 (L) 02/12/2024 0420   GFRNONAA 40 (L) 06/29/2020 0916   GFRAA 46 (L) 06/29/2020 0916   CBC    Component Value Date/Time   WBC 4.7 02/07/2024 0421   RBC 2.76 (L) 02/07/2024 0421   HGB 8.4 (L) 02/07/2024 0421   HGB 9.2 (L) 07/27/2023 0915   HCT 25.3 (L) 02/07/2024 0421   HCT 28.2 (L) 07/27/2023 0915   PLT 194 02/07/2024 0421   PLT 256 07/27/2023 0915   MCV 91.7 02/07/2024 0421   MCV 96 07/27/2023 0915   MCH 30.4 02/07/2024 0421   MCHC 33.2 02/07/2024 0421   RDW 13.6 02/07/2024 0421   RDW 12.6 07/27/2023 0915   LYMPHSABS 0.9 02/07/2024 0421   LYMPHSABS 0.7 07/16/2023 0921   MONOABS 0.5 02/07/2024 0421   EOSABS 0.0 02/07/2024 0421   EOSABS 0.0 07/16/2023  0921   BASOSABS 0.0 02/07/2024 0421   BASOSABS 0.0 07/16/2023 0921      Assessment/Plan: CKD stage 3b/4 - AKI now resolved with Crt at baseline. AKI likely cardiorenal and improved with decongestion. Recurrent issues led to palliative care involvement and patient has chosen not to receive HD now or in the future. Acute on chronic HFpEF - due to lack of torsemide  after discharge.  Has improved. Continue oral torsemide  Anemia of CKD stage III - TSAT 17% and low iron.  Dosed with IV iron.  Consider ESA once BP improves HTN with HTN urgency -remains elevated, asymptomatic.  On amlodipine , hydralazine , and clonidine .  Follow with volume unloading.  Pulse in the 50s to 60s limiting nodal agents.  Low GFR limits use of RAAS inhibitors.  I doubt we will see significant improvement except with volume optimization. And given GOC intensive BP lowering strategies may not be the best choice. I would continue current agents including diuretics. Patient expressed disinterest in additional meds given her current pill burden Hyponatremia: Corrects to near 130 when accounting for glucose.  Hopefully will improve further with volume optimization DM type 2 - per primary  Given the patient's stable creatinine, improved volume status, no desire for additional blood pressure medications or dialysis at this time we will sign off.  Levorn Reason, MD  [facility]

## 2024-02-13 ENCOUNTER — Other Ambulatory Visit (HOSPITAL_COMMUNITY): Payer: Self-pay

## 2024-02-13 ENCOUNTER — Inpatient Hospital Stay: Payer: Self-pay | Admitting: Family Medicine

## 2024-02-13 LAB — RENAL FUNCTION PANEL
Albumin: 2.6 g/dL — ABNORMAL LOW (ref 3.5–5.0)
Anion gap: 10 (ref 5–15)
BUN: 86 mg/dL — ABNORMAL HIGH (ref 8–23)
CO2: 29 mmol/L (ref 22–32)
Calcium: 8.8 mg/dL — ABNORMAL LOW (ref 8.9–10.3)
Chloride: 92 mmol/L — ABNORMAL LOW (ref 98–111)
Creatinine, Ser: 2.83 mg/dL — ABNORMAL HIGH (ref 0.44–1.00)
GFR, Estimated: 16 mL/min — ABNORMAL LOW (ref >60)
Glucose, Bld: 235 mg/dL — ABNORMAL HIGH (ref 70–99)
Phosphorus: 4.3 mg/dL (ref 2.5–4.6)
Potassium: 4.3 mmol/L (ref 3.5–5.1)
Sodium: 131 mmol/L — ABNORMAL LOW (ref 135–145)

## 2024-02-13 LAB — GLUCOSE, CAPILLARY
Glucose-Capillary: 329 mg/dL — ABNORMAL HIGH (ref 70–99)
Glucose-Capillary: 260 mg/dL — ABNORMAL HIGH (ref 70–99)

## 2024-02-13 MED ORDER — MAGNESIUM OXIDE -MG SUPPLEMENT 400 (240 MG) MG PO TABS
400.0000 mg | ORAL_TABLET | Freq: Every day | ORAL | 1 refills | Status: DC
Start: 2024-02-13 — End: 2024-05-16

## 2024-02-13 MED ORDER — INSULIN LISPRO (1 UNIT DIAL) 100 UNIT/ML (KWIKPEN): 0.0000 [IU] | PEN_INJECTOR | Freq: Three times a day (TID) | SUBCUTANEOUS | 11 refills | Status: DC

## 2024-02-13 MED ORDER — HYDRALAZINE HCL 100 MG PO TABS: 100.0000 mg | ORAL_TABLET | Freq: Three times a day (TID) | ORAL | 1 refills | Status: DC

## 2024-02-13 MED ORDER — TORSEMIDE 20 MG PO TABS: 60.0000 mg | ORAL_TABLET | Freq: Every day | ORAL | 5 refills | Status: DC

## 2024-02-13 MED ORDER — CARVEDILOL 12.5 MG PO TABS: 12.5000 mg | ORAL_TABLET | Freq: Two times a day (BID) | ORAL | 5 refills | Status: DC

## 2024-02-13 MED ORDER — ISOSORBIDE MONONITRATE ER 60 MG PO TB24
60.0000 mg | ORAL_TABLET | Freq: Every day | ORAL | 5 refills | Status: DC
Start: 2024-02-13 — End: 2024-05-16

## 2024-02-13 MED ORDER — ROSUVASTATIN CALCIUM 5 MG PO TABS
5.0000 mg | ORAL_TABLET | Freq: Every day | ORAL | 3 refills | Status: DC
Start: 2024-02-13 — End: 2024-05-16

## 2024-02-13 MED ORDER — INSULIN DEGLUDEC 200 UNIT/ML ~~LOC~~ SOPN: 6.0000 [IU] | PEN_INJECTOR | Freq: Every day | SUBCUTANEOUS | 2 refills | Status: DC

## 2024-02-13 MED ORDER — ALBUTEROL SULFATE HFA 108 (90 BASE) MCG/ACT IN AERS: 2.0000 | INHALATION_SPRAY | RESPIRATORY_TRACT | 1 refills | Status: DC | PRN

## 2024-02-13 MED ORDER — POTASSIUM CHLORIDE ER 10 MEQ PO TBCR: 10.0000 meq | EXTENDED_RELEASE_TABLET | Freq: Every day | ORAL | 2 refills | Status: DC

## 2024-02-13 MED ORDER — CLONIDINE HCL 0.3 MG PO TABS: 0.3000 mg | ORAL_TABLET | Freq: Two times a day (BID) | ORAL | 5 refills | Status: DC

## 2024-02-13 MED ORDER — AMLODIPINE BESYLATE 10 MG PO TABS: 10.0000 mg | ORAL_TABLET | Freq: Every day | ORAL | 5 refills | Status: DC

## 2024-02-13 NOTE — Progress Notes (Signed)
 Mobility Specialist Progress Note:    02/13/24 1500  Mobility  Activity Ambulated with assistance in room;Transferred from bed to chair  Level of Assistance Minimal assist, patient does 75% or more  Assistive Device Front wheel walker;None  Distance Ambulated (ft) 10 ft  Range of Motion/Exercises Active;All extremities  Activity Response Tolerated well  Mobility Referral Yes  Mobility visit 1 Mobility  Mobility Specialist Start Time (ACUTE ONLY) 1425  Mobility Specialist Stop Time (ACUTE ONLY) 1435  Mobility Specialist Time Calculation (min) (ACUTE ONLY) 10 min   Pt received in bed, assisted pt to ambulate and WC. Required MinA to stand and ambulate with RW. Tolerated well, asx throughout., Left pt with family, all needs met.   Glinda Lapping Mobility Specialist Please contact via Special educational needs teacher or  Rehab office at [phone]

## 2024-02-13 NOTE — TOC Transition Note (Signed)
 Transition of Care [facility]) - Discharge Note   Patient Details  Name: Emily Jennings MRN: [id number] Date of Birth: 1933-11-27  [facility] CM/SW Contact:  Orelia Binet, RN Phone Number: 02/13/2024, 3:23 PM   Clinical Narrative:   Discharging home with [facility] health, orders placed, [facility] updated.    Final next level of care: Home w Home Health Services Barriers to Discharge: Barriers Resolved   Patient Goals and CMS Choice Patient states their goals for this hospitalization and ongoing recovery are:: return home. CMS Medicare.gov Compare Post Acute Care list provided to:: Patient Represenative (must comment) Choice offered to / list presented to : Spouse     Discharge Placement                    Patient and family notified of of transfer: 02/13/24  Discharge Plan and Services Additional resources added to the After Visit Summary for        Social Drivers of Health (SDOH) Interventions SDOH Screenings   Food Insecurity: No Food Insecurity (02/05/2024)  Housing: Low Risk  (02/05/2024)  Transportation Needs: No Transportation Needs (02/05/2024)  Utilities: Not At Risk (02/05/2024)  Alcohol Screen: Low Risk  (11/15/2022)  Depression (PHQ2-9): Low Risk  (10/24/2023)  Financial Resource Strain: Low Risk  (12/28/2022)  Physical Activity: Sufficiently Active (11/15/2022)  Social Connections: Socially Integrated (02/05/2024)  Stress: No Stress Concern Present (12/28/2022)  Tobacco Use: Low Risk  (02/04/2024)     Readmission Risk Interventions    02/05/2024   12:02 PM 02/01/2024    9:55 AM 01/30/2024   12:15 PM  Readmission Risk Prevention Plan  Transportation Screening Complete Complete Complete  HRI or Home Care Consult  Complete Complete  Social Work Consult for Recovery Care Planning/Counseling  Complete Complete  Palliative Care Screening  Complete Complete  Medication Review [employer]) Complete Complete Complete  PCP or Specialist appointment  within 3-5 days of discharge Not Complete    HRI or Home Care Consult Complete    SW Recovery Care/Counseling Consult Complete    Palliative Care Screening Not Applicable    Skilled Nursing Facility Complete

## 2024-02-13 NOTE — Discharge Instructions (Signed)
 1)Very Low-salt diet advised---Less than 2 gm of Sodium per day advised----ok to use Mrs DASH salt substitute instead of Salt 2)Weigh yourself daily, call if you gain more than 3 pounds in 1 day or more than 5 pounds in 1 week as your diuretic medications may need to be adjusted 3)Short acting Humalog  insulin  per sliding scale 0-12 ---:-- Insulin  injection 0-12 Units 0-12 Units Subcutaneous, 3 times daily with meals CBG 70 - 120: 0 unit CBG 121 - 150: 0 unit  CBG 151 - 200: 2 unit CBG 201 - 250: 4 units CBG 251 - 300: 6 units CBG 301 - 350: 8 units  CBG 351 - 400: 10 units  CBG > 400: 12 units  4) stop Lasix /furosemide , start torsemide /Demadex  60 mg (20 mg x 3 tabs) Daily instead for fluid 5) stop nifedipine  (Adalat ), start isosorbide  instead-for blood pressure 6) stop 75/25 insulin --- use Tresiba  insulin  6 units daily instead 7) follow-up with primary care physician within a week for repeat CBC and BMP blood test on 4 recheck and reevaluation

## 2024-02-13 NOTE — Care Management Important Message (Signed)
 Important Message  Patient Details  Name: Emily Jennings MRN: [id number] Date of Birth: Nov 27, 1933   Important Message Given:  Yes - Medicare IM     Jayleon Mcfarlane L Keily Lepp 02/13/2024, 1:44 PM

## 2024-02-13 NOTE — TOC Benefit Eligibility Note (Signed)
 Pharmacy Patient Advocate Encounter  Insurance verification completed.    The patient is insured through [employer]. Patient has Medicare and is not eligible for a copay card, but may be able to apply for patient assistance or Medicare RX Payment Plan (Patient Must reach out to their plan, if eligible for payment plan), if available.    Ran test claim for Torsemide  20mg  and the current 30 day co-pay is $5.00.  Ran test claim for Bumetanide 1mg  and the current 30 day co-pay is $0.    This test claim was processed through [facility]- copay amounts may vary at other pharmacies due to [employer], or as the patient moves through the different stages of their insurance plan.

## 2024-02-13 NOTE — Progress Notes (Signed)
 Mobility Specialist Progress Note:    02/13/24 1320  Mobility  Activity Ambulated with assistance in room  Level of Assistance Minimal assist, patient does 75% or more  Assistive Device Front wheel walker  Distance Ambulated (ft) 15 ft  Range of Motion/Exercises Active;All extremities  Activity Response Tolerated well  Mobility Referral Yes  Mobility visit 1 Mobility  Mobility Specialist Start Time (ACUTE ONLY) 1300  Mobility Specialist Stop Time (ACUTE ONLY) 1320  Mobility Specialist Time Calculation (min) (ACUTE ONLY) 20 min   Pt received in bed, agreeable to mobility. Required MinA to stand and ambulate with RW. Tolerated well, had some LOB and fear of falling. Returned pt supine, alarm on. All needs met.  Glinda Lapping Mobility Specialist Please contact via Special educational needs teacher or  Rehab office at [phone]

## 2024-02-14 ENCOUNTER — Telehealth: Payer: Self-pay

## 2024-02-14 NOTE — Transitions of Care (Post Inpatient/ED Visit) (Signed)
   02/14/2024  Name: Emily Jennings MRN: [id number] DOB: 1934-05-28  Today's TOC FU Call Status: Today's TOC FU Call Status:: Unsuccessful Call (1st Attempt) Unsuccessful Call (1st Attempt) Date: 02/14/24  Attempted to reach the patient regarding the most recent Inpatient/ED visit.  Follow Up Plan: Additional outreach attempts will be made to reach the patient to complete the Transitions of Care (Post Inpatient/ED visit) call.   Orpha Blade, RN, BSN, CEN [employer]- Transition of Care Team.  [facility] [phone]

## 2024-02-15 ENCOUNTER — Telehealth: Payer: Self-pay | Admitting: *Deleted

## 2024-02-15 ENCOUNTER — Encounter: Admitting: Family

## 2024-02-15 NOTE — Transitions of Care (Post Inpatient/ED Visit) (Signed)
   02/15/2024  Name: TIFANY HIRSCH MRN: [id number] DOB: 12-06-1933  Today's TOC FU Call Status: Today's TOC FU Call Status:: Unsuccessful Call (2nd Attempt) Unsuccessful Call (2nd Attempt) Date: 02/15/24  Attempted to reach the patient regarding the most recent Inpatient/ED visit.  Follow Up Plan: Additional outreach attempts will be made to reach the patient to complete the Transitions of Care (Post Inpatient/ED visit) call.   Arna Better RN, BSN [place]  [facility] RN Care Manager [phone]

## 2024-02-18 ENCOUNTER — Telehealth: Payer: Self-pay | Admitting: *Deleted

## 2024-02-18 NOTE — Transitions of Care (Post Inpatient/ED Visit) (Signed)
   02/18/2024  Name: Emily Jennings MRN: [id number] DOB: 1934/03/30  Today's TOC FU Call Status: Today's TOC FU Call Status:: Unsuccessful Call (3rd Attempt) Unsuccessful Call (3rd Attempt) Date: 02/13/24  Attempted to reach the patient regarding the most recent Inpatient/ED visit.  Follow Up Plan: No further outreach attempts will be made at this time. We have been unable to contact the patient.  Arna Better RN, BSN [place]  [facility] RN Care Manager [phone]

## 2024-02-21 ENCOUNTER — Telehealth: Payer: Self-pay | Admitting: Family

## 2024-02-21 NOTE — Telephone Encounter (Signed)
 Called to confirm/remind patient of their appointment [facility] on 02/22/[facility].   Appointment:   [] Confirmed  [x] Left mess   [] No answer/No voice mail  [] VM Full/unable to leave message  [] Phone not in service  Patient reminded to bring all medications and/or complete list.  Confirmed patient has transportation. Gave directions, instructed to utilize valet parking.

## 2024-02-21 NOTE — Progress Notes (Deleted)
 Advanced Heart Failure Clinic Note   Referring Physician: recent admission PCP: Towanda Fret, MD Cardiologist: Armida Lander, MD   Chief Complaint:    HPI:  Ms Emily Jennings is a 89 y/o female with a history of chronic HFpEF, valvular heart disease, HTN, HLD, Type II DM, Stage 3-4 CKD, hyperlipidemia, anxiety, breast cancer, carotid stenosis, NSVT during stress test 01/2016, anemia and GERD.   Echo in 08/2022 had shown mild AS and was recommend to obtain a follow-up echocardiogram at the time of her next visit.   Echo 04/2023: EF of 65 to 70% with no regional wall motion abnormalities, moderate LVH, G2DD, normal RV function, mildly elevated PASP, a small pericardial effusion and chordal systolic anterior motion of the mitral valve with mild MR and mild to moderate AS.   Was hospitalized 08/24 for a right cerebellar intraparenchymal hemorrhage at that time which was felt to be due to elevated BP. Hospitalized again later that month for an acute CHF exacerbation. She had a recurrent admission in 06/2023 for acute hypoxic respiratory failure in the setting of a CHF exacerbation and aspiration pneumonia.   Admitted 06/2023 for incarcerated left inguinal hernia and required NG placement and underwent surgical repair. Also had an AKI during admission with creatinine peaking at 4.56 and returned to baseline at the time of discharge.   Admitted 01/22/24 due to hyperglycemia over preceding 2-3 days. Glucose 356, BUN 78, creatinine 3.70, GFR 11. Urinalysis was positive for glycosuria, magnesium  2.3. Treated with 10 units of NovoLog , IV hydration was provided. HTN meds adjusted. IV diuresed.   Admitted 02/04/24 with shortness of breath, leg swelling and orthopnea after being unable to get 60mg  torsemide  dose. BNP 1335 with chest x-ray findings showing cardiomegaly with small left pleural effusion. Creatinine 4.17, GFR 10. Nephrology was consulted for assistance with diuresing the patient and she was  admitted for acute on chronic HFpEF with cardiorenal syndrome. With high dose IV lasix , she has diuresed well with improving creatinine. BP medications have been titrated upward, though the patient wishes to limit further addition of medications. Echo this admit stable from previous with hyperdynamic LV, G2DD, mild to moderate AS.   She presents today for her initial HF visit with a chief complaint of    Review of Systems: [y] = yes, [ ]  = no   General: Weight gain [ ] ; Weight loss [ ] ; Anorexia [ ] ; Fatigue [ ] ; Fever [ ] ; Chills [ ] ; Weakness [ ]   Cardiac: Chest pain/pressure [ ] ; Resting SOB [ ] ; Exertional SOB [ ] ; Orthopnea [ ] ; Pedal Edema [ ] ; Palpitations [ ] ; Syncope [ ] ; Presyncope [ ] ; Paroxysmal nocturnal dyspnea[ ]   Pulmonary: Cough [ ] ; Wheezing[ ] ; Hemoptysis[ ] ; Sputum [ ] ; Snoring [ ]   GI: Vomiting[ ] ; Dysphagia[ ] ; Melena[ ] ; Hematochezia [ ] ; Heartburn[ ] ; Abdominal pain [ ] ; Constipation [ ] ; Diarrhea [ ] ; BRBPR [ ]   GU: Hematuria[ ] ; Dysuria [ ] ; Nocturia[ ]   Vascular: Pain in legs with walking [ ] ; Pain in feet with lying flat [ ] ; Non-healing sores [ ] ; Stroke [ ] ; TIA [ ] ; Slurred speech [ ] ;  Neuro: Headaches[ ] ; Vertigo[ ] ; Seizures[ ] ; Paresthesias[ ] ;Blurred vision [ ] ; Diplopia [ ] ; Vision changes [ ]   Ortho/Skin: Arthritis [ ] ; Joint pain [ ] ; Muscle pain [ ] ; Joint swelling [ ] ; Back Pain [ ] ; Rash [ ]   Psych: Depression[ ] ; Anxiety[ ]   Heme: Bleeding problems [ ] ; Clotting disorders [ ] ; Anemia [ ]   Endocrine: Diabetes [ ] ; Thyroid  dysfunction[ ]    Past Medical History:  Diagnosis Date   Allergy    Anxiety    ANXIETY DISORDER, GENERALIZED 07/15/2007   Qualifier: Diagnosis of  By: Michaell Adolph     Arthritis    Cancer [facility]) 2009   breast, carcinoma in situ left   Carcinoma in situ of breast 05/21/2008   Qualifier: Diagnosis of  By: Rodolph Clap MD, Margaret  Diagnosed in 2009, completed 5 year course of tamoxifen, no evidence of recurrence    Carotid stenosis     11/16/2005  mild plaque formation and stenosis proximal right ECA   Cataract    Complication of anesthesia    Coronary artery disease    cardiac catheterization on 03/20/2006  LAD mid 40% stenosis, left circumflex mild 40% stenosis, RCA mid-vessel 40% to 50% lesion   EF 60%   Diabetes mellitus    GERD (gastroesophageal reflux disease)    Hernia, inguinal    left   Hyperglycemia    Hypertension    Insomnia 11/16/2011   Low blood potassium    Non-insulin  dependent type 2 diabetes mellitus (HCC)    Osteoporosis    Shortness of breath    2D Echocardiogram 01/26/2009   EF of greater than 55%, mild MR, mild TR, normal ventricular function   Thickened endometrium 10/26/2017   Noted by gyne in 2017, missed 6 month follow up, referred in 09/2017   Ventricular tachycardia, non-sustained (HCC)    developed during stress test 02/08/2006, spontaneously aborted, mild reversible apical defect    Current Outpatient Medications  Medication Sig Dispense Refill   albuterol  (VENTOLIN  HFA) 108 (90 Base) MCG/ACT inhaler Inhale 2 puffs into the lungs every 2 (two) hours as needed for wheezing or shortness of breath. 17 g 1   amLODipine  (NORVASC ) 10 MG tablet Take 1 tablet (10 mg total) by mouth daily. 30 tablet 5   carvedilol  (COREG ) 12.5 MG tablet Take 1 tablet (12.5 mg total) by mouth 2 (two) times daily with a meal. 60 tablet 5   cloNIDine  (CATAPRES ) 0.3 MG tablet Take 1 tablet (0.3 mg total) by mouth 2 (two) times daily. 60 tablet 5   hydrALAZINE  (APRESOLINE ) 100 MG tablet Take 1 tablet (100 mg total) by mouth 3 (three) times daily. 270 tablet 1   insulin  degludec (TRESIBA ) 200 UNIT/ML FlexTouch Pen Inject 6 Units into the skin daily at 10 pm. 3 mL 2   insulin  lispro (HUMALOG  KWIKPEN) 100 UNIT/ML KwikPen Inject 0-12 Units into the skin 3 (three) times daily. Short acting Humalog  insulin  per sliding scale 0-12 ---:-- Insulin  injection 0-12 Units 0-12 Units Subcutaneous, 3 times daily with meals CBG 70 -  120: 0 unit CBG 121 - 150: 0 unit  CBG 151 - 200: 2 unit CBG 201 - 250: 4 units CBG 251 - 300: 6 units CBG 301 - 350: 8 units  CBG 351 - 400: 10 units  CBG > 400: 12 units 15 mL 11   isosorbide  mononitrate (IMDUR ) 60 MG 24 hr tablet Take 1 tablet (60 mg total) by mouth daily. 30 tablet 5   magnesium  oxide (MAG-OX) 400 (240 Mg) MG tablet Take 1 tablet (400 mg total) by mouth daily. 30 tablet 1   potassium chloride  (KLOR-CON ) 10 MEQ tablet Take 1 tablet (10 mEq total) by mouth daily. Take While taking Torsemide /Demadex  30 tablet 2   RESTASIS  0.05 % ophthalmic emulsion Place 1 drop into both eyes 2 (two) times daily.  rosuvastatin  (CRESTOR ) 5 MG tablet Take 1 tablet (5 mg total) by mouth daily. 90 tablet 3   torsemide  (DEMADEX ) 20 MG tablet Take 3 tablets (60 mg total) by mouth daily. 90 tablet 5   No current facility-administered medications for this visit.    Allergies  Allergen Reactions   Farxiga [Dapagliflozin] Other (See Comments)    DKA   Metformin  And Related Diarrhea    Lost appetite and weight    Benadryl [Diphenhydramine Hcl] Hypertension   Citalopram Other (See Comments)    Unknown    Tramadol  Other (See Comments)    Felt light headed and dizzy   Amlodipine  Swelling   Crestor  [Rosuvastatin ] Swelling and Other (See Comments)    "Feet swelling" Makes her feel weak. Pt takes 5mg  at home      Social History   Socioeconomic History   Marital status: Married    Spouse name: Not on file   Number of children: 2   Years of education: na   Highest education level: Some college, no degree  Occupational History   Occupation: retired    Associate Professor: retired  Tobacco Use   Smoking status: Never    Passive exposure: Never   Smokeless tobacco: Never  Vaping Use   Vaping status: Never Used  Substance and Sexual Activity   Alcohol use: No   Drug use: No   Sexual activity: Not Currently    Birth control/protection: Post-menopausal  Other Topics Concern   Not on file  Social  History Narrative   Lives with her husband and son and attends yoga at the [facility] 2 days a week and speaks to her grandson nightly on the phone   Social Drivers of Health   Financial Resource Strain: Low Risk  (12/28/2022)   Overall Financial Resource Strain (CARDIA)    Difficulty of Paying Living Expenses: Not hard at all  Food Insecurity: No Food Insecurity (02/05/2024)   Hunger Vital Sign    Worried About Running Out of Food in the Last Year: Never true    Ran Out of Food in the Last Year: Never true  Transportation Needs: No Transportation Needs (02/05/2024)   PRAPARE - Administrator, Civil Service (Medical): No    Lack of Transportation (Non-Medical): No  Physical Activity: Sufficiently Active (11/15/2022)   Exercise Vital Sign    Days of Exercise per Week: 5 days    Minutes of Exercise per Session: 30 min  Stress: No Stress Concern Present (12/28/2022)   [employer] of Occupational Health - Occupational Stress Questionnaire    Feeling of Stress : Only a little  Social Connections: Socially Integrated (02/05/2024)   Social Connection and Isolation Panel [NHANES]    Frequency of Communication with Friends and Family: Three times a week    Frequency of Social Gatherings with Friends and Family: Three times a week    Attends Religious Services: More than 4 times per year    Active Member of Clubs or Organizations: Yes    Attends Banker Meetings: 1 to 4 times per year    Marital Status: Married  Catering manager Violence: Not At Risk (02/05/2024)   Humiliation, Afraid, Rape, and Kick questionnaire    Fear of Current or Ex-Partner: No    Emotionally Abused: No    Physically Abused: No    Sexually Abused: No      Family History  Problem Relation Age of Onset   Hypertension Mother    Hyperlipidemia Mother  Stroke Mother    Urticaria Mother    Cancer Father        pancreatic   Colon cancer Father    Heart disease Brother 52       bypass   Heart  disease Brother 54       bypass   Arthritis Other    Asthma Other    Diabetes Other    Colon cancer Paternal Aunt    Esophageal cancer Neg Hx    Stomach cancer Neg Hx    Rectal cancer Neg Hx        PHYSICAL EXAM: General:  Well appearing. No respiratory difficulty HEENT: normal Neck: supple. no JVD. Carotids 2+ bilat; no bruits. No lymphadenopathy or thyromegaly appreciated. Cor: PMI nondisplaced. Regular rate & rhythm. No rubs, gallops or murmurs. Lungs: clear Abdomen: soft, nontender, nondistended. No hepatosplenomegaly. No bruits or masses. Good bowel sounds. Extremities: no cyanosis, clubbing, rash, edema Neuro: alert & oriented x 3, cranial nerves grossly intact. moves all 4 extremities w/o difficulty. Affect pleasant.  ECG:   ASSESSMENT & PLAN:     Charlette Console, FNP 02/21/24

## 2024-02-22 ENCOUNTER — Other Ambulatory Visit: Payer: Self-pay | Admitting: "Endocrinology

## 2024-02-22 ENCOUNTER — Telehealth: Payer: Self-pay | Admitting: Family

## 2024-02-22 ENCOUNTER — Encounter: Admitting: Family

## 2024-02-22 DIAGNOSIS — E1122 Type 2 diabetes mellitus with diabetic chronic kidney disease: Secondary | ICD-10-CM

## 2024-02-22 NOTE — Telephone Encounter (Signed)
 Patient did not show for her initial Heart Failure Clinic appointment on 02/22/24.

## 2024-03-01 NOTE — Discharge Summary (Signed)
 Emily Jennings, is a 89 y.o. female  DOB 01/15/1934  MRN [id number].  Admission date:  02/04/2024  Admitting Physician  Fidel Huddle, DO  Discharge Date:  03/01/2024   Primary MD  Towanda Fret, MD  Recommendations for primary care physician for things to follow:  1)Very Low-salt diet advised---Less than 2 gm of Sodium per day advised----ok to use Mrs DASH salt substitute instead of Salt 2)Weigh yourself daily, call if you gain more than 3 pounds in 1 day or more than 5 pounds in 1 week as your diuretic medications may need to be adjusted 3)Short acting Humalog  insulin  per sliding scale 0-12 ---:-- Insulin  injection 0-12 Units 0-12 Units Subcutaneous, 3 times daily with meals CBG 70 - 120: 0 unit CBG 121 - 150: 0 unit  CBG 151 - 200: 2 unit CBG 201 - 250: 4 units CBG 251 - 300: 6 units CBG 301 - 350: 8 units  CBG 351 - 400: 10 units  CBG > 400: 12 units  4) stop Lasix /furosemide , start torsemide /Demadex  60 mg (20 mg x 3 tabs) Daily instead for fluid 5) stop nifedipine  (Adalat ), start isosorbide  instead-for blood pressure 6) stop 75/25 insulin --- use Tresiba  insulin  6 units daily instead 7) follow-up with primary care physician within a week for repeat CBC and BMP blood test on 4 recheck and reevaluation  Admission Diagnosis  Acute diastolic heart failure (HCC) [I50.31] Hyponatremia [E87.1] Uremia [N19] Other hypervolemia [E87.79] Acute renal failure superimposed on chronic kidney disease, unspecified acute renal failure type, unspecified CKD stage (HCC) [N17.9, N18.9]   Discharge Diagnosis  Acute diastolic heart failure (HCC) [I50.31] Hyponatremia [E87.1] Uremia [N19] Other hypervolemia [E87.79] Acute renal failure superimposed on chronic kidney disease, unspecified acute renal failure type, unspecified CKD stage (HCC) [N17.9, N18.9]   Principal Problem:   Acute on chronic diastolic (congestive) heart  failure (HCC) Active Problems:   Hyponatremia   Resistant hypertension   Dysphagia   Acute kidney injury superimposed on stage 4 chronic kidney disease (HCC)   Type 2 diabetes mellitus with other specified complication (HCC)   Anemia due to chronic kidney disease      Past Medical History:  Diagnosis Date   Allergy    Anxiety    ANXIETY DISORDER, GENERALIZED 07/15/2007   Qualifier: Diagnosis of  By: Michaell Adolph     Arthritis    Cancer [facility]) 2009   breast, carcinoma in situ left   Carcinoma in situ of breast 05/21/2008   Qualifier: Diagnosis of  By: Rodolph Clap MD, Margaret  Diagnosed in 2009, completed 5 year course of tamoxifen, no evidence of recurrence    Carotid stenosis    11/16/2005  mild plaque formation and stenosis proximal right ECA   Cataract    Complication of anesthesia    Coronary artery disease    cardiac catheterization on 03/20/2006  LAD mid 40% stenosis, left circumflex mild 40% stenosis, RCA mid-vessel 40% to 50% lesion   EF 60%   Diabetes mellitus    GERD (gastroesophageal reflux disease)  Hernia, inguinal    left   Hyperglycemia    Hypertension    Insomnia 11/16/2011   Low blood potassium    Non-insulin  dependent type 2 diabetes mellitus (HCC)    Osteoporosis    Shortness of breath    2D Echocardiogram 01/26/2009   EF of greater than 55%, mild MR, mild TR, normal ventricular function   Thickened endometrium 10/26/2017   Noted by gyne in 2017, missed 6 month follow up, referred in 09/2017   Ventricular tachycardia, non-sustained (HCC)    developed during stress test 02/08/2006, spontaneously aborted, mild reversible apical defect    Past Surgical History:  Procedure Laterality Date   BOWEL RESECTION Left 07/19/2023   Procedure: SMALL BOWEL RESECTION;  Surgeon: Awilda Bogus, MD;  Location: [facility];  Service: General;  Laterality: Left;   BREAST LUMPECTOMY Left 2009   Left breast 2009   CATARACT EXTRACTION W/PHACO Left 10/28/2014   Procedure:  PHACO EMULSION CATARACT EXTRACTION WITH INTRAOCULAR LENS IMPLANT LEFT EYE (IOC);  Surgeon: Ben Bracken, MD;  Location: [facility] OR;  Service: Ophthalmology;  Laterality: Left;   COLONOSCOPY     cyst removed from left foot     INGUINAL HERNIA REPAIR Left 07/19/2023   Procedure: HERNIA REPAIR FEMORAL INCARCERATED;  Surgeon: Awilda Bogus, MD;  Location: [facility];  Service: General;  Laterality: Left;   REFRACTIVE SURGERY Left      HPI  from the history and physical done on the day of admission:   HPI: Emily Jennings is a 89 y.o. female with medical history significant of diastolic heart failure, insulin  dependent type 2 diabetes, coronary artery disease, CKD stage IV who presented to the ED for shortness of breath.  She states that she was not able to take the new fluid medication.  Apparently her insurance company did not pay for the 60 mg tablet.  Patient reports she feels like she is swollen up to her chest.  Denies any chest discomfort, palpitations, fever, nausea, vomiting.  Endorses some intermittent cough and diarrhea but these are not new.     ED Course: ED vitals; afebrile, HR 60, RR 17, BP 126/54 satting 98% on room air, weight 59.4 kg; BMP showing hypochloremic hyponatremia, Na 124 Cl 86, glucose 156, BUN 111, serum creatinine 4.17, GFR 10 otherwise normal.  She has some normocytic anemia, hemoglobin 7.9; troponin 15, BNP 1335 with chest x-ray findings showing cardiomegaly with small left pleural effusion.  Patient was given 60 mg oral torsemide .  EDP consulted hospitalist service for possible admission.   Review of Systems: As mentioned in the history of present illness. All other systems reviewed and are negative.  Hospital Course:      A/p 1)Acute on chronic HFpEF: Echo this admit stable from previous with hyperdynamic LV, G2DD, mild to moderate AS - Much improved after diuresis -Low-salt diet advised -Discharged home on torsemide  60 mg daily  2)AKI on stage IV CKD:  - Baseline  creatinine thought to be 3.2-3.5. Appreciate nephrology consultation. Pt very clear in her desire to avoid dialysis. Cr actually improving better than/at lower limit of putative baseline. Dx stage IV CKD remains correct -After diuresis creatinine has stabilized around 2.8   3)Hypokalemia: In the setting of diuresis - Normalized with replacement   4)Hypothermia: With no infectious symptoms. AM cortisol 16.7. TSH wnl at 3.081.  - Monitoring screening blood cultures (NGTD from 02/06/24) -COVID flu and influenza negative, MRSA screen negative - Temp has normalized   5)T2DM: HbA1c  7.4%.  Reflecting uncontrolled DM with hyperglycemia PTA - Very labile CBGs  -stop 75/25 insulin --- use Tresiba  insulin  6 units daily instead Use Novolog /Humalog  Sliding scale insulin  with Accu-Cheks/Fingersticks as ordered --- see discharge instructions   HTN with HTN urgency:  - Continue norvasc  10mg  daily, clonidine  0.3mg  TID, hydralazine  TID. -- BP improved with diuresis stop nifedipine  (Adalat ),  added isosorbide  and Coreg --please see discharge med rec   Hypervolemic hyponatremia:  - Continue efforts at diuresis, monitoring daily. Asymptomatic.  -Improved with diuresis--- sodium is up to 131 from 124   Chronic anemia of CKD--overall stable -Hgb currently greater than 8 - Anemia panel wnl, though will supp folic acid  (low normal at 6.7) - Defer ESA to nephrology.    HLD:  - Patient is 89 years old and on Crestor  -?? Risk Versus Benefits  - Advised patient to discuss with the primary cardiologist if Crestor  needs to be continued   Weakness: SNF recommended, though pt and spouse very clearly state she'll be going back home with [facility] support.  -- Discharge home with home health services.   Discharge Condition: stable, nephrology and  palliative care services consult appreciated  Follow UP   Follow-up Information     [facility] Follow up.   Specialty: Home Health Services Why: PT/OT will  call to schedule your home visit Contact information: [address] [place] [address] [phone]         [facility]. Go on 02/22/2024.   Specialty: Cardiology Why: Hospital Follow-Up 02/22/24 @ 12:00 [facility] [address], [place]) , [address], Second Floor Please bring all medications with you to the appointment Free Valet Parking at the door Contact information: [address]  [place]  [phone] [phone]                 Consults obtained -nephrology and  palliative care services consult appreciated  Diet and Activity recommendation:  As advised  Discharge Instructions   Discharge Instructions     Call MD for:  difficulty breathing, headache or visual disturbances   Complete by: As directed    Call MD for:  persistant dizziness or light-headedness   Complete by: As directed    Call MD for:  persistant nausea and vomiting   Complete by: As directed    Call MD for:  severe uncontrolled pain   Complete by: As directed    Call MD for:  temperature >100.4   Complete by: As directed    Diet - low sodium heart healthy   Complete by: As directed    Diet Carb Modified   Complete by: As directed    Discharge instructions   Complete by: As directed    1)Very Low-salt diet advised---Less than 2 gm of Sodium per day advised----ok to use Mrs DASH salt substitute instead of Salt 2)Weigh yourself daily, call if you gain more than 3 pounds in 1 day or more than 5 pounds in 1 week as your diuretic medications may need to be adjusted 3)Short acting Humalog  insulin  per sliding scale 0-12 ---:-- Insulin  injection 0-12 Units 0-12 Units Subcutaneous, 3 times daily with meals CBG 70 - 120: 0 unit CBG 121 - 150: 0 unit  CBG 151 - 200: 2 unit CBG 201 - 250: 4 units CBG 251 - 300: 6 units CBG 301 - 350: 8 units  CBG 351 - 400: 10 units  CBG > 400: 12 units  4) stop Lasix /furosemide , start torsemide /Demadex  60 mg (20 mg x 3 tabs) Daily instead for fluid 5) stop nifedipine  (Adalat ), start isosorbide  instead-for blood pressure 6) stop 75/25 insulin --- use Tresiba  insulin  6 units daily instead 7) follow-up with primary care physician within a week for repeat CBC and BMP blood test on 4 recheck and reevaluation   Discharge wound care:   Complete by: As directed    As above   Increase activity slowly   Complete by: As directed          Discharge Medications     Allergies as of 02/13/2024       Reactions   Farxiga [dapagliflozin] Other (See Comments)   DKA   Metformin  And Related Diarrhea   Lost appetite and weight    Benadryl [diphenhydramine Hcl] Hypertension   Citalopram Other (See Comments)   Unknown    Tramadol  Other (See Comments)   Felt light headed and dizzy   Amlodipine  Swelling   Crestor  [rosuvastatin ] Swelling, Other (See Comments)   Feet swelling Makes her feel weak. Pt takes 5mg  at home        Medication List     TAKE these medications    albuterol  108 (90 Base) MCG/ACT inhaler Commonly known as: VENTOLIN  HFA Inhale 2 puffs into the lungs every 2 (two) hours as needed for wheezing or shortness of breath.   amLODipine  10 MG tablet Commonly known as: NORVASC  Take 1 tablet (10 mg total) by mouth daily.   carvedilol  12.5 MG tablet Commonly known as: COREG  Take 1 tablet (12.5 mg total) by mouth 2 (two) times daily with a meal.   cloNIDine  0.3 MG tablet Commonly known as: CATAPRES  Take 1 tablet (0.3 mg total) by mouth 2 (two) times daily.   hydrALAZINE  100 MG tablet Commonly known as: APRESOLINE  Take 1 tablet (100 mg total) by mouth 3 (three) times daily.   insulin  degludec 200 UNIT/ML FlexTouch Pen Commonly known as: TRESIBA  Inject 6 Units into the skin daily at 10 pm.   insulin  lispro 100 UNIT/ML KwikPen Commonly known as: HumaLOG  KwikPen Inject 0-12 Units into the skin 3  (three) times daily. Short acting Humalog  insulin  per sliding scale 0-12 ---:-- Insulin  injection 0-12 Units 0-12 Units Subcutaneous, 3 times daily with meals CBG 70 - 120: 0 unit CBG 121 - 150: 0 unit  CBG 151 - 200: 2 unit CBG 201 - 250: 4 units CBG 251 - 300: 6 units CBG 301 - 350: 8 units  CBG 351 - 400: 10 units  CBG > 400: 12 units   isosorbide  mononitrate 60 MG 24 hr tablet Commonly known as: IMDUR  Take 1 tablet (60 mg total) by mouth daily.   magnesium  oxide 400 (240 Mg) MG tablet Commonly known as: MAG-OX Take 1 tablet (400 mg total) by mouth daily.   potassium chloride  10 MEQ tablet Commonly known as: KLOR-CON  Take 1 tablet (10 mEq total) by mouth daily. Take While taking Torsemide /Demadex    Restasis  0.05 % ophthalmic emulsion Generic drug: cycloSPORINE  Place 1 drop into both eyes 2 (two) times daily.   rosuvastatin  5 MG tablet Commonly known as: CRESTOR  Take 1 tablet (5 mg total) by mouth daily.   torsemide  20 MG tablet Commonly known as: DEMADEX  Take 3 tablets (60 mg total) by mouth daily. What changed:  medication strength when to take this               Discharge Care Instructions  (From admission,  onward)           Start     Ordered   02/13/24 0000  Discharge wound care:       Comments: As above   02/13/24 1444            Major procedures and Radiology Reports - PLEASE review detailed and final reports for all details, in brief -   ECHOCARDIOGRAM COMPLETE Result Date: 02/05/2024    ECHOCARDIOGRAM REPORT   Patient Name:   Emily Jennings Date of Exam: 02/05/2024 Medical Rec #:  [id number]     Height:       60.0 in Accession #:    [id number]    Weight:       125.9 lb Date of Birth:  [birth year]      BSA:          1.533 m Patient Age:    89 years      BP:           150/63 mmHg Patient Gender: F             HR:           52 bpm. Exam Location:  [facility] Procedure: 2D Echo, Cardiac Doppler and Color Doppler (Both Spectral and Color            Flow  Doppler were utilized during procedure). Indications:    CHF-Acute Diastolic I50.31  History:        Patient has prior history of Echocardiogram examinations, most                 recent 04/27/2023. CHF, Aortic Valve Disease; Risk                 Factors:Hypertension, Diabetes and Dyslipidemia.  Sonographer:    Denese Finn RCS Referring Phys: [id number] VONDRA BRIMAGE IMPRESSIONS  1. Left ventricular ejection fraction, by estimation, is 70 to 75%. The left ventricle has hyperdynamic function. The left ventricle has no regional wall motion abnormalities. There is moderate asymmetric left ventricular hypertrophy. Left ventricular diastolic parameters are consistent with Grade II diastolic dysfunction (pseudonormalization).  2. Right ventricular systolic function is normal. The right ventricular size is normal. Mildly increased right ventricular wall thickness.  3. Left atrial size was severely dilated.  4. Right atrial size was moderately dilated.  5. Elongated and thickened mitral chordae There is systolic anteror motion of chord into LVOT during systole Similar to findings in echo from August 2024. Aaron Aas The mitral valve is myxomatous. Mild mitral valve regurgitation.  6. AV is thickened, calcified with restricted motion. Peak and mean gradients through the valve are 46 and 24 mm Hg respectively. AVA (VTI) is 1.15 cm2. Dimensionless indext is 0.45 Ovreall consistent with mild to moderate AS. Aaron Aas The aortic valve is tricuspid. Aortic valve regurgitation is trivial.  7. The inferior vena cava is dilated in size with >50% respiratory variability, suggesting right atrial pressure of 8 mmHg. FINDINGS  Left Ventricle: Left ventricular ejection fraction, by estimation, is 70 to 75%. The left ventricle has hyperdynamic function. The left ventricle has no regional wall motion abnormalities. The left ventricular internal cavity size was normal in size. There is moderate asymmetric left ventricular hypertrophy. Left ventricular  diastolic parameters are consistent with Grade II diastolic dysfunction (pseudonormalization). Right Ventricle: The right ventricular size is normal. Mildly increased right ventricular wall thickness. Right ventricular systolic function is normal. Left Atrium: Left atrial size was severely dilated. Right Atrium: Right atrial size  was moderately dilated. Pericardium: There is no evidence of pericardial effusion. Mitral Valve: Elongated and thickened mitral chordae There is systolic anteror motion of chord into LVOT during systole Similar to findings in echo from August 2024. The mitral valve is myxomatous. Mild mitral valve regurgitation. MV peak gradient, 10.9 mmHg. The mean mitral valve gradient is 3.0 mmHg. Tricuspid Valve: The tricuspid valve is normal in structure. Tricuspid valve regurgitation is mild. Aortic Valve: AV is thickened, calcified with restricted motion. Peak and mean gradients through the valve are 46 and 24 mm Hg respectively. AVA (VTI) is 1.15 cm2. Dimensionless indext is 0.45 Ovreall consistent with mild to moderate AS. The aortic valve  is tricuspid. Aortic valve regurgitation is trivial. Aortic regurgitation PHT measures 666 msec. Aortic valve mean gradient measures 24.0 mmHg. Aortic valve peak gradient measures 45.4 mmHg. Aortic valve area, by VTI measures 1.15 cm. Pulmonic Valve: The pulmonic valve was thickened with good excursion. Pulmonic valve regurgitation is mild. Aorta: The aortic root is normal in size and structure. Venous: The inferior vena cava is dilated in size with greater than 50% respiratory variability, suggesting right atrial pressure of 8 mmHg. IAS/Shunts: No atrial level shunt detected by color flow Doppler.  LEFT VENTRICLE PLAX 2D LVIDd:         4.40 cm   Diastology LVIDs:         2.20 cm   LV e' medial:    5.98 cm/s LV PW:         1.50 cm   LV E/e' medial:  26.8 LV IVS:        1.10 cm   LV e' lateral:   6.74 cm/s LVOT diam:     1.80 cm   LV E/e' lateral: 23.7 LV SV:          101 LV SV Index:   66 LVOT Area:     2.54 cm  RIGHT VENTRICLE RV S prime:     10.00 cm/s TAPSE (M-mode): 1.7 cm LEFT ATRIUM              Index        RIGHT ATRIUM           Index LA diam:        4.20 cm  2.74 cm/m   RA Area:     20.40 cm LA Vol (A2C):   133.0 ml 86.75 ml/m  RA Volume:   51.10 ml  33.33 ml/m LA Vol (A4C):   99.4 ml  64.83 ml/m LA Biplane Vol: 118.0 ml 76.96 ml/m  AORTIC VALVE AV Area (Vmax):    1.01 cm AV Area (Vmean):   1.09 cm AV Area (VTI):     1.15 cm AV Vmax:           337.00 cm/s AV Vmean:          227.000 cm/s AV VTI:            0.877 m AV Peak Grad:      45.4 mmHg AV Mean Grad:      24.0 mmHg LVOT Vmax:         134.00 cm/s LVOT Vmean:        97.500 cm/s LVOT VTI:          0.397 m LVOT/AV VTI ratio: 0.45 AI PHT:            666 msec  AORTA Ao Root diam: 3.00 cm MITRAL VALVE  TRICUSPID VALVE MV Area (PHT): 3.27 cm       TR Peak grad:   46.2 mmHg MV Area VTI:   1.66 cm       TR Vmax:        340.00 cm/s MV Peak grad:  10.9 mmHg MV Mean grad:  3.0 mmHg       SHUNTS MV Vmax:       1.65 m/s       Systemic VTI:  0.40 m MV Vmean:      73.9 cm/s      Systemic Diam: 1.80 cm MV Decel Time: 232 msec MR Peak grad:    136.9 mmHg MR Mean grad:    81.0 mmHg MR Vmax:         585.00 cm/s MR Vmean:        395.0 cm/s MR PISA:         1.57 cm MR PISA Eff ROA: 8 mm MR PISA Radius:  0.50 cm MV E velocity: 160.00 cm/s MV A velocity: 129.00 cm/s MV E/A ratio:  1.24 Ola Berger MD Electronically signed by Ola Berger MD Signature Date/Time: 02/05/2024/1:21:02 PM    Final    DG Chest Portable 1 View Result Date: 02/04/2024 CLINICAL DATA:  Shortness of breath EXAM: PORTABLE CHEST 1 VIEW COMPARISON:  01/31/2024 FINDINGS: Cardiomegaly. Aortic atherosclerosis. Small left pleural effusion with left base atelectasis. Right lung clear. No edema. No acute bony abnormality. IMPRESSION: Cardiomegaly. Small left pleural effusion with left base atelectasis. Electronically Signed   By: Janeece Mechanic  M.D.   On: 02/04/2024 18:19   Today   Subjective    Emily Jennings today has no new complaints  No fever  Or chills   No Nausea, Vomiting or Diarrhea        Patient has been seen and examined prior to discharge   Objective   Blood pressure (!) 167/54, pulse 76, temperature 97.8 F (36.6 C), temperature source Axillary, resp. rate 14, height 5' (1.524 m), weight 58 kg, SpO2 96%.  No intake or output data in the 24 hours ending 03/01/24 1947  Exam Gen:- Awake Alert, no acute distress, no conversational dyspnea, dyspnea on exertion was improved HEENT:- [place].AT, No sclera icterus Neck-Supple Neck,No JVD,.  Lungs-fair air movement without wheezing CV- S1, S2 normal, regular, 3/6 SM Abd-  +ve B.Sounds, Abd Soft, No tenderness,    Extremity/Skin:-Much improved lower extremity edema,   good pulses Psych-affect is appropriate, oriented x3 Neuro-generalized weakness, no new focal deficits, no tremors    Data Review   CBC w Diff:  Lab Results  Component Value Date   WBC 4.7 02/07/2024   HGB 8.4 (L) 02/07/2024   HGB 9.2 (L) 07/27/2023   HCT 25.3 (L) 02/07/2024   HCT 28.2 (L) 07/27/2023   PLT 194 02/07/2024   PLT 256 07/27/2023   LYMPHOPCT 19 02/07/2024   MONOPCT 10 02/07/2024   EOSPCT 1 02/07/2024   BASOPCT 0 02/07/2024   CMP:  Lab Results  Component Value Date   NA 131 (L) 02/13/2024   NA 134 11/28/2023   K 4.3 02/13/2024   CL 92 (L) 02/13/2024   CO2 29 02/13/2024   BUN 86 (H) 02/13/2024   BUN 61 (H) 11/28/2023   CREATININE 2.83 (H) 02/13/2024   CREATININE 1.70 (H) 02/01/2023   PROT 5.0 (L) 02/04/2024   PROT 6.0 11/28/2023   ALBUMIN  2.6 (L) 02/13/2024   ALBUMIN  4.2 11/28/2023   BILITOT 0.5 02/04/2024   BILITOT 0.4  11/28/2023   ALKPHOS 59 02/04/2024   AST 21 02/04/2024   ALT 14 02/04/2024  .  Total Discharge time is about 33 minutes  Colin Dawley M.D on 03/01/2024 at 7:47 PM  Go to [email or web address] -  for contact info  [facility] - Office   [phone]

## 2024-03-10 ENCOUNTER — Ambulatory Visit: Admission: EM | Admit: 2024-03-10 | Discharge: 2024-03-10 | Disposition: A | Discharge status: Home / Self Care

## 2024-03-10 ENCOUNTER — Inpatient Hospital Stay (HOSPITAL_COMMUNITY)
Admission: EM | Admit: 2024-03-10 | Discharge: 2024-03-12 | DRG: 291 | Disposition: A | Discharge status: Home Health | Attending: Internal Medicine | Admitting: Internal Medicine

## 2024-03-10 ENCOUNTER — Other Ambulatory Visit: Payer: Self-pay

## 2024-03-10 ENCOUNTER — Encounter (HOSPITAL_COMMUNITY): Payer: Self-pay | Admitting: Internal Medicine

## 2024-03-10 ENCOUNTER — Emergency Department (HOSPITAL_COMMUNITY)

## 2024-03-10 ENCOUNTER — Ambulatory Visit: Payer: Self-pay

## 2024-03-10 DIAGNOSIS — Z884 Allergy status to anesthetic agent status: Secondary | ICD-10-CM | POA: Diagnosis not present

## 2024-03-10 DIAGNOSIS — Z7984 Long term (current) use of oral hypoglycemic drugs: Secondary | ICD-10-CM

## 2024-03-10 DIAGNOSIS — I2510 Atherosclerotic heart disease of native coronary artery without angina pectoris: Secondary | ICD-10-CM | POA: Diagnosis present

## 2024-03-10 DIAGNOSIS — E1122 Type 2 diabetes mellitus with diabetic chronic kidney disease: Secondary | ICD-10-CM | POA: Diagnosis not present

## 2024-03-10 DIAGNOSIS — Z825 Family history of asthma and other chronic lower respiratory diseases: Secondary | ICD-10-CM | POA: Diagnosis not present

## 2024-03-10 DIAGNOSIS — Z7189 Other specified counseling: Secondary | ICD-10-CM

## 2024-03-10 DIAGNOSIS — K224 Dyskinesia of esophagus: Secondary | ICD-10-CM | POA: Diagnosis not present

## 2024-03-10 DIAGNOSIS — M810 Age-related osteoporosis without current pathological fracture: Secondary | ICD-10-CM | POA: Diagnosis present

## 2024-03-10 DIAGNOSIS — Z66 Do not resuscitate: Secondary | ICD-10-CM | POA: Diagnosis not present

## 2024-03-10 DIAGNOSIS — Z8249 Family history of ischemic heart disease and other diseases of the circulatory system: Secondary | ICD-10-CM

## 2024-03-10 DIAGNOSIS — E1169 Type 2 diabetes mellitus with other specified complication: Secondary | ICD-10-CM | POA: Diagnosis not present

## 2024-03-10 DIAGNOSIS — Z83438 Family history of other disorder of lipoprotein metabolism and other lipidemia: Secondary | ICD-10-CM

## 2024-03-10 DIAGNOSIS — I509 Heart failure, unspecified: Secondary | ICD-10-CM | POA: Diagnosis not present

## 2024-03-10 DIAGNOSIS — Z794 Long term (current) use of insulin: Secondary | ICD-10-CM

## 2024-03-10 DIAGNOSIS — N184 Chronic kidney disease, stage 4 (severe): Secondary | ICD-10-CM | POA: Diagnosis not present

## 2024-03-10 DIAGNOSIS — Z79899 Other long term (current) drug therapy: Secondary | ICD-10-CM

## 2024-03-10 DIAGNOSIS — R1311 Dysphagia, oral phase: Secondary | ICD-10-CM | POA: Diagnosis present

## 2024-03-10 DIAGNOSIS — J9811 Atelectasis: Secondary | ICD-10-CM | POA: Diagnosis not present

## 2024-03-10 DIAGNOSIS — Z808 Family history of malignant neoplasm of other organs or systems: Secondary | ICD-10-CM

## 2024-03-10 DIAGNOSIS — R609 Edema, unspecified: Secondary | ICD-10-CM | POA: Diagnosis not present

## 2024-03-10 DIAGNOSIS — Z8261 Family history of arthritis: Secondary | ICD-10-CM

## 2024-03-10 DIAGNOSIS — Z833 Family history of diabetes mellitus: Secondary | ICD-10-CM

## 2024-03-10 DIAGNOSIS — E876 Hypokalemia: Secondary | ICD-10-CM | POA: Diagnosis not present

## 2024-03-10 DIAGNOSIS — Z832 Family history of diseases of the blood and blood-forming organs and certain disorders involving the immune mechanism: Secondary | ICD-10-CM | POA: Diagnosis not present

## 2024-03-10 DIAGNOSIS — R0602 Shortness of breath: Secondary | ICD-10-CM | POA: Diagnosis not present

## 2024-03-10 DIAGNOSIS — Z86000 Personal history of in-situ neoplasm of breast: Secondary | ICD-10-CM

## 2024-03-10 DIAGNOSIS — I5033 Acute on chronic diastolic (congestive) heart failure: Secondary | ICD-10-CM | POA: Diagnosis present

## 2024-03-10 DIAGNOSIS — Z888 Allergy status to other drugs, medicaments and biological substances status: Secondary | ICD-10-CM | POA: Diagnosis not present

## 2024-03-10 DIAGNOSIS — E871 Hypo-osmolality and hyponatremia: Secondary | ICD-10-CM | POA: Diagnosis present

## 2024-03-10 DIAGNOSIS — Z800 Family history of malignant neoplasm of digestive organs: Secondary | ICD-10-CM

## 2024-03-10 DIAGNOSIS — N189 Chronic kidney disease, unspecified: Secondary | ICD-10-CM

## 2024-03-10 DIAGNOSIS — Z823 Family history of stroke: Secondary | ICD-10-CM | POA: Diagnosis not present

## 2024-03-10 DIAGNOSIS — R140 Abdominal distension (gaseous): Secondary | ICD-10-CM | POA: Diagnosis present

## 2024-03-10 DIAGNOSIS — Z91148 Patient's other noncompliance with medication regimen for other reason: Secondary | ICD-10-CM

## 2024-03-10 DIAGNOSIS — E1165 Type 2 diabetes mellitus with hyperglycemia: Secondary | ICD-10-CM

## 2024-03-10 DIAGNOSIS — I10 Essential (primary) hypertension: Secondary | ICD-10-CM | POA: Diagnosis not present

## 2024-03-10 DIAGNOSIS — I130 Hypertensive heart and chronic kidney disease with heart failure and stage 1 through stage 4 chronic kidney disease, or unspecified chronic kidney disease: Secondary | ICD-10-CM | POA: Diagnosis not present

## 2024-03-10 DIAGNOSIS — J90 Pleural effusion, not elsewhere classified: Secondary | ICD-10-CM

## 2024-03-10 DIAGNOSIS — R1310 Dysphagia, unspecified: Secondary | ICD-10-CM | POA: Diagnosis not present

## 2024-03-10 DIAGNOSIS — M7989 Other specified soft tissue disorders: Secondary | ICD-10-CM | POA: Diagnosis not present

## 2024-03-10 LAB — COMPREHENSIVE METABOLIC PANEL WITH GFR
ALT: 11 U/L (ref 0–44)
AST: 23 U/L (ref 15–41)
Albumin: 2.9 g/dL — ABNORMAL LOW (ref 3.5–5.0)
Alkaline Phosphatase: 63 U/L (ref 38–126)
Anion gap: 11 (ref 5–15)
BUN: 66 mg/dL — ABNORMAL HIGH (ref 8–23)
CO2: 29 mmol/L (ref 22–32)
Calcium: 8.6 mg/dL — ABNORMAL LOW (ref 8.9–10.3)
Chloride: 87 mmol/L — ABNORMAL LOW (ref 98–111)
Creatinine, Ser: 2.84 mg/dL — ABNORMAL HIGH (ref 0.44–1.00)
GFR, Estimated: 15 mL/min — ABNORMAL LOW (ref >60)
Glucose, Bld: 172 mg/dL — ABNORMAL HIGH (ref 70–99)
Potassium: 3.5 mmol/L (ref 3.5–5.1)
Sodium: 127 mmol/L — ABNORMAL LOW (ref 135–145)
Total Bilirubin: 0.5 mg/dL (ref 0.0–1.2)
Total Protein: 5 g/dL — ABNORMAL LOW (ref 6.5–8.1)

## 2024-03-10 LAB — CBC WITH DIFFERENTIAL/PLATELET
Abs Immature Granulocytes: 0.01 10*3/uL (ref 0.00–0.07)
Basophils Absolute: 0 10*3/uL (ref 0.0–0.1)
Basophils Relative: 0 %
Eosinophils Absolute: 0 10*3/uL (ref 0.0–0.5)
Eosinophils Relative: 1 %
HCT: 25.3 % — ABNORMAL LOW (ref 36.0–46.0)
Hemoglobin: 8.5 g/dL — ABNORMAL LOW (ref 12.0–15.0)
Immature Granulocytes: 0 %
Lymphocytes Relative: 20 %
Lymphs Abs: 0.6 10*3/uL — ABNORMAL LOW (ref 0.7–4.0)
MCH: 30.6 pg (ref 26.0–34.0)
MCHC: 33.6 g/dL (ref 30.0–36.0)
MCV: 91 fL (ref 80.0–100.0)
Monocytes Absolute: 0.4 10*3/uL (ref 0.1–1.0)
Monocytes Relative: 12 %
Neutro Abs: 2.1 10*3/uL (ref 1.7–7.7)
Neutrophils Relative %: 67 %
Platelets: 162 10*3/uL (ref 150–400)
RBC: 2.78 MIL/uL — ABNORMAL LOW (ref 3.87–5.11)
RDW: 13.4 % (ref 11.5–15.5)
WBC: 3.2 10*3/uL — ABNORMAL LOW (ref 4.0–10.5)
nRBC: 0 % (ref 0.0–0.2)

## 2024-03-10 LAB — GLUCOSE, CAPILLARY: Glucose-Capillary: 231 mg/dL — ABNORMAL HIGH (ref 70–99)

## 2024-03-10 LAB — TROPONIN I (HIGH SENSITIVITY): Troponin I (High Sensitivity): 9 ng/L (ref <18)

## 2024-03-10 LAB — BRAIN NATRIURETIC PEPTIDE: B Natriuretic Peptide: 869 pg/mL — ABNORMAL HIGH (ref 0.0–100.0)

## 2024-03-10 MED ORDER — FUROSEMIDE 10 MG/ML IJ SOLN
60.0000 mg | Freq: Once | INTRAMUSCULAR | Status: AC
  Administered 2024-03-10: 60 mg via INTRAVENOUS
  Filled 2024-03-10: qty 6

## 2024-03-10 MED ORDER — CARVEDILOL 12.5 MG PO TABS
12.5000 mg | ORAL_TABLET | Freq: Two times a day (BID) | ORAL | Status: DC
  Administered 2024-03-11 – 2024-03-12 (×4): 12.5 mg via ORAL
  Filled 2024-03-10 (×4): qty 1

## 2024-03-10 MED ORDER — INSULIN ASPART 100 UNIT/ML IJ SOLN
0.0000 [IU] | Freq: Every day | INTRAMUSCULAR | Status: DC
  Administered 2024-03-10: 2 [IU] via SUBCUTANEOUS
  Administered 2024-03-11: 3 [IU] via SUBCUTANEOUS

## 2024-03-10 MED ORDER — ONDANSETRON HCL 4 MG PO TABS
4.0000 mg | ORAL_TABLET | Freq: Four times a day (QID) | ORAL | Status: DC | PRN
Start: 2024-03-10 — End: 2024-03-13

## 2024-03-10 MED ORDER — INSULIN ASPART 100 UNIT/ML IJ SOLN
0.0000 [IU] | Freq: Three times a day (TID) | INTRAMUSCULAR | Status: DC
  Administered 2024-03-11: 3 [IU] via SUBCUTANEOUS

## 2024-03-10 MED ORDER — AMLODIPINE BESYLATE 5 MG PO TABS
10.0000 mg | ORAL_TABLET | Freq: Every day | ORAL | Status: DC
  Administered 2024-03-11 – 2024-03-12 (×2): 10 mg via ORAL
  Filled 2024-03-10 (×2): qty 2

## 2024-03-10 MED ORDER — INSULIN GLARGINE-YFGN 100 UNIT/ML ~~LOC~~ SOLN
6.0000 [IU] | Freq: Every day | SUBCUTANEOUS | Status: DC
  Administered 2024-03-10 – 2024-03-11 (×2): 6 [IU] via SUBCUTANEOUS
  Filled 2024-03-10 (×3): qty 0.06

## 2024-03-10 MED ORDER — ONDANSETRON HCL 4 MG/2ML IJ SOLN: 4.0000 mg | Freq: Four times a day (QID) | INTRAMUSCULAR | Status: DC | PRN

## 2024-03-10 MED ORDER — POTASSIUM CHLORIDE 20 MEQ PO PACK
40.0000 meq | PACK | Freq: Once | ORAL | Status: AC
  Administered 2024-03-10: 40 meq via ORAL
  Filled 2024-03-10: qty 2

## 2024-03-10 MED ORDER — INSULIN ASPART 100 UNIT/ML IJ SOLN: 0.0000 [IU] | Freq: Three times a day (TID) | INTRAMUSCULAR | Status: DC

## 2024-03-10 MED ORDER — CYCLOSPORINE 0.05 % OP EMUL
1.0000 [drp] | Freq: Two times a day (BID) | OPHTHALMIC | Status: DC
  Administered 2024-03-10 – 2024-03-12 (×4): 1 [drp] via OPHTHALMIC
  Filled 2024-03-10 (×4): qty 30

## 2024-03-10 MED ORDER — ACETAMINOPHEN 650 MG RE SUPP: 650.0000 mg | Freq: Four times a day (QID) | RECTAL | Status: DC | PRN

## 2024-03-10 MED ORDER — CLONIDINE HCL 0.2 MG PO TABS
0.3000 mg | ORAL_TABLET | Freq: Two times a day (BID) | ORAL | Status: DC
  Administered 2024-03-10 – 2024-03-12 (×4): 0.3 mg via ORAL
  Filled 2024-03-10 (×4): qty 1

## 2024-03-10 MED ORDER — ALBUTEROL SULFATE (2.5 MG/3ML) 0.083% IN NEBU
2.5000 mg | INHALATION_SOLUTION | RESPIRATORY_TRACT | Status: DC | PRN
  Administered 2024-03-12: 2.5 mg via RESPIRATORY_TRACT
  Filled 2024-03-10: qty 3

## 2024-03-10 MED ORDER — HEPARIN SODIUM (PORCINE) 5000 UNIT/ML IJ SOLN
5000.0000 [IU] | Freq: Three times a day (TID) | INTRAMUSCULAR | Status: DC
  Administered 2024-03-10 – 2024-03-12 (×6): 5000 [IU] via SUBCUTANEOUS
  Filled 2024-03-10 (×6): qty 1

## 2024-03-10 MED ORDER — ACETAMINOPHEN 325 MG PO TABS: 650.0000 mg | ORAL_TABLET | Freq: Four times a day (QID) | ORAL | Status: DC | PRN

## 2024-03-10 MED ORDER — POLYETHYLENE GLYCOL 3350 17 G PO PACK: 17.0000 g | PACK | Freq: Every day | ORAL | Status: DC | PRN

## 2024-03-10 MED ORDER — FUROSEMIDE 10 MG/ML IJ SOLN
40.0000 mg | Freq: Two times a day (BID) | INTRAMUSCULAR | Status: DC
  Administered 2024-03-11 – 2024-03-12 (×4): 40 mg via INTRAVENOUS
  Filled 2024-03-10 (×4): qty 4

## 2024-03-10 NOTE — Assessment & Plan Note (Addendum)
 Stable.  On multiple antihypertensive agents -Resume Norvasc , carvedilol  12.5 mg twice daily, clonidine  0.3 mg twice daily,  Imdur  - Pending stable blood pressure, resume hydralazine  100 mg 3 times daily in a.m.

## 2024-03-10 NOTE — ED Notes (Signed)
 Patient is being discharged from the Urgent Care and sent to the Emergency Department via POV. Per Vernell Hammersmith, PA-C, patient is in need of higher level of care due to SOB. Patient is aware and verbalizes understanding of plan of care.  Vitals:   03/10/24 1308  BP: (!) 140/62  Pulse: 61  Resp: 18  Temp: (!) 97.5 F (36.4 C)  SpO2: 95%

## 2024-03-10 NOTE — Assessment & Plan Note (Addendum)
 Chronic hyponatremia - hypervolemic hyponatremia.  Sodium 127.  At baseline.

## 2024-03-10 NOTE — Telephone Encounter (Signed)
 FYI Only or Action Required?: FYI only for provider.  Patient was last seen in primary care on 10/24/2023 by Antonetta Rollene BRAVO, MD. Called Nurse Triage reporting Shortness of Breath. Symptoms began several days ago. Interventions attempted: Prescription medications: extra water pill. Symptoms are: unchanged.  Triage Disposition: See HCP Within 4 Hours (Or PCP Triage)  Patient/caregiver understands and will follow disposition?: Yes       Copied from CRM [id number]. Topic: Clinical - Red Word Triage >> Mar 10, 2024  8:54 AM Tiffany B wrote: Red Word that prompted transfer to Nurse Triage: Patient experiencing difficult breathing. Reason for Disposition  [1] Longstanding difficulty breathing (e.g., CHF, COPD, emphysema) AND [2] WORSE than normal  Answer Assessment - Initial Assessment Questions 1. RESPIRATORY STATUS: Describe your breathing? (e.g., wheezing, shortness of breath, unable to speak, severe coughing)      Difficulty breathing - I'm have CHF, and I'm swole up from my head to feet to my throat Endorses taking water pills twice a day - does provide some relief This AM - having problem breathing and sore throat 2. ONSET: When did this breathing problem begin?      Worsening over past few days 3. PATTERN Does the difficult breathing come and go, or has it been constant since it started?      constant 4. SEVERITY: How bad is your breathing? (e.g., mild, moderate, severe)    - MILD: No SOB at rest, mild SOB with walking, speaks normally in sentences, can lie down, no retractions, pulse < 100.    - MODERATE: SOB at rest, SOB with minimal exertion and prefers to sit, cannot lie down flat, speaks in phrases, mild retractions, audible wheezing, pulse 100-120.    - SEVERE: Very SOB at rest, speaks in single words, struggling to breathe, sitting hunched forward, retractions, pulse > 120      Mild - moderate Triager does appreciate audible SOB/wheezing during call. Pt is speaking  in partial sentences.  5. RECURRENT SYMPTOM: Have you had difficulty breathing before? If Yes, ask: When was the last time? and What happened that time?      First time 6. CARDIAC HISTORY: Do you have any history of heart disease? (e.g., heart attack, angina, bypass surgery, angioplasty)      CHF 7. LUNG HISTORY: Do you have any history of lung disease?  (e.g., pulmonary embolus, asthma, emphysema)     Negative per chart 8. CAUSE: What do you think is causing the breathing problem?      CHF 9. OTHER SYMPTOMS: Do you have any other symptoms? (e.g., dizziness, runny nose, cough, chest pain, fever)     Runny nose Chest tightness 10. O2 SATURATION MONITOR:  Do you use an oxygen saturation monitor (pulse oximeter) at home? If Yes, ask: What is your reading (oxygen level) today? What is your usual oxygen saturation reading? (e.g., 95%)       N/a 11. PREGNANCY: Is there any chance you are pregnant? When was your last menstrual period?       N/a 12. TRAVEL: Have you traveled out of the country in the last month? (e.g., travel history, exposures)       N/a  Economist initially spoke to spouse, and then spoke to pt-- of note, triager did have some difficulty understanding pt  Protocols used: Breathing Difficulty-A-AH

## 2024-03-10 NOTE — ED Provider Notes (Signed)
 [place] EMERGENCY DEPARTMENT AT [facility] Provider Note   CSN: [id number] Arrival date & time: 03/10/24  1350     Patient presents with: Shortness of Breath   Emily Jennings is a 89 y.o. female.   Patient is an 89 year old female who presents emergency department the chief complaint of shortness of breath and increased swelling to her lower extremities.  She was admitted to the hospital back in May secondary to acute CHF exacerbation.  She notes that she has been compliant with her torsemide  at home but continues to have ongoing swelling despite this.  She does note that she has had a 20 pound weight gain as well.  She denies any associated chest pain.  She denies any abdominal pain, nausea, vomiting, diarrhea.   Shortness of Breath      Prior to Admission medications   Medication Sig Start Date End Date Taking? Authorizing Provider  albuterol  (VENTOLIN  HFA) 108 (90 Base) MCG/ACT inhaler Inhale 2 puffs into the lungs every 2 (two) hours as needed for wheezing or shortness of breath. 02/13/24   Emokpae, Courage, MD  amLODipine  (NORVASC ) 10 MG tablet Take 1 tablet (10 mg total) by mouth daily. 02/13/24   Pearlean Manus, MD  calcitRIOL  (ROCALTROL ) 0.25 MCG capsule Take 0.25 mcg by mouth 3 (three) times a week. 02/28/24   [provider]  carvedilol  (COREG ) 12.5 MG tablet Take 1 tablet (12.5 mg total) by mouth 2 (two) times daily with a meal. 02/13/24   Emokpae, Courage, MD  cloNIDine  (CATAPRES ) 0.3 MG tablet Take 1 tablet (0.3 mg total) by mouth 2 (two) times daily. 02/13/24   Pearlean Manus, MD  furosemide  (LASIX ) 40 MG tablet Take 40 mg by mouth.    [provider]  hydrALAZINE  (APRESOLINE ) 100 MG tablet Take 1 tablet (100 mg total) by mouth 3 (three) times daily. 02/13/24   Pearlean Manus, MD  insulin  degludec (TRESIBA ) 200 UNIT/ML FlexTouch Pen Inject 6 Units into the skin daily at 10 pm. 02/13/24   Pearlean Manus, MD  insulin  lispro (HUMALOG  KWIKPEN)  100 UNIT/ML KwikPen Inject 0-12 Units into the skin 3 (three) times daily. Short acting Humalog  insulin  per sliding scale 0-12 ---:-- Insulin  injection 0-12 Units 0-12 Units Subcutaneous, 3 times daily with meals CBG 70 - 120: 0 unit CBG 121 - 150: 0 unit  CBG 151 - 200: 2 unit CBG 201 - 250: 4 units CBG 251 - 300: 6 units CBG 301 - 350: 8 units  CBG 351 - 400: 10 units  CBG > 400: 12 units 02/13/24   Emokpae, Courage, MD  isosorbide  mononitrate (IMDUR ) 60 MG 24 hr tablet Take 1 tablet (60 mg total) by mouth daily. 02/13/24   Pearlean Manus, MD  magnesium  oxide (MAG-OX) 400 (240 Mg) MG tablet Take 1 tablet (400 mg total) by mouth daily. 02/13/24   Pearlean Manus, MD  Potassium Chloride  ER 20 MEQ TBCR Take 1 tablet by mouth daily. 02/10/24   [provider]  RESTASIS  0.05 % ophthalmic emulsion Place 1 drop into both eyes 2 (two) times daily. 11/01/23   [provider]  rosuvastatin  (CRESTOR ) 5 MG tablet Take 1 tablet (5 mg total) by mouth daily. 02/13/24   Pearlean Manus, MD  torsemide  (DEMADEX ) 20 MG tablet Take 3 tablets (60 mg total) by mouth daily. 02/13/24   Pearlean Manus, MD    Allergies: Farxiga [dapagliflozin], Metformin  and related, Benadryl [diphenhydramine hcl], Citalopram, Tramadol , Amlodipine , and Crestor  [rosuvastatin ]    Review of  Systems  Respiratory:  Positive for shortness of breath.   Cardiovascular:  Positive for leg swelling.  All other systems reviewed and are negative.   Updated Vital Signs Ht 5' (1.524 m)   Wt 59 kg   BMI 25.39 kg/m   Physical Exam Vitals and nursing note reviewed.  Constitutional:      Appearance: Normal appearance.  HENT:     Head: Normocephalic and atraumatic.     Nose: Nose normal.     Mouth/Throat:     Mouth: Mucous membranes are moist.   Eyes:     Extraocular Movements: Extraocular movements intact.     Conjunctiva/sclera: Conjunctivae normal.     Pupils: Pupils are equal, round, and reactive to light.     Cardiovascular:     Rate and Rhythm: Normal rate and regular rhythm.     Pulses: Normal pulses.     Heart sounds: Normal heart sounds.  Pulmonary:     Effort: Pulmonary effort is normal. No tachypnea or respiratory distress.     Breath sounds: No stridor. Examination of the right-lower field reveals rales. Examination of the left-lower field reveals rales. Rales present. No decreased breath sounds, wheezing or rhonchi.  Chest:     Chest wall: No tenderness.  Abdominal:     General: Abdomen is flat. Bowel sounds are normal.     Palpations: Abdomen is soft.     Tenderness: There is no abdominal tenderness. There is no guarding.   Musculoskeletal:        General: Normal range of motion.     Cervical back: Normal range of motion and neck supple.     Right lower leg: No tenderness. Edema present.     Left lower leg: No tenderness. Edema present.   Skin:    General: Skin is warm and dry.     Findings: No ecchymosis, erythema or rash.   Neurological:     General: No focal deficit present.     Mental Status: She is alert and oriented to person, place, and time. Mental status is at baseline.   Psychiatric:        Mood and Affect: Mood normal.        Behavior: Behavior normal.        Thought Content: Thought content normal.        Judgment: Judgment normal.     (all labs ordered are listed, but only abnormal results are displayed) Labs Reviewed  CBC WITH DIFFERENTIAL/PLATELET - Abnormal; Notable for the following components:      Result Value   WBC 3.2 (*)    RBC 2.78 (*)    Hemoglobin 8.5 (*)    HCT 25.3 (*)    Lymphs Abs 0.6 (*)    All other components within normal limits  COMPREHENSIVE METABOLIC PANEL WITH GFR  BRAIN NATRIURETIC PEPTIDE  TROPONIN I (HIGH SENSITIVITY)    EKG: None  Radiology: DG Chest 2 View Result Date: 03/10/2024 CLINICAL DATA:  Shortness of breath, leg swelling EXAM: CHEST - 2 VIEW COMPARISON:  02/04/2024 FINDINGS: Small bilateral pleural  effusions. Mild cardiomegaly. Aortic atherosclerosis. Minimal bibasilar opacities, likely atelectasis. No overt edema. No acute bony abnormality. IMPRESSION: Cardiomegaly. Small bilateral pleural effusions with bibasilar atelectasis. Electronically Signed   By: Franky Crease M.D.   On: 03/10/2024 15:12     Procedures   Medications Ordered in the ED  furosemide  (LASIX ) injection 60 mg (60 mg Intravenous Given 03/10/24 1508)  Medical Decision Making Amount and/or Complexity of Data Reviewed Labs: ordered. Radiology: ordered.  Risk Prescription drug management. Decision regarding hospitalization.   This patient presents to the ED for concern of shortness of breath and lower extremity edema, this involves an extensive number of treatment options, and is a complaint that carries with it a high risk of complications and morbidity.  The differential diagnosis includes acute CHF, COPD, pneumonia, sepsis, hypoxia respiratory failure, kidney failure, pulmonary embolus   Co morbidities that complicate the patient evaluation  CHF   Additional history obtained:  Additional history obtained from family External records from outside source obtained and reviewed including medical records   Lab Tests:  I Ordered, and personally interpreted labs.  The pertinent results include: Leukopenia, anemia, hyponatremia, hypochloremia, creatinine at baseline, normal liver function, elevated BNP, normal troponin   Imaging Studies ordered:  I ordered imaging studies including chest x-ray I independently visualized and interpreted imaging which showed small bilateral pleural effusions I agree with the radiologist interpretation   Cardiac Monitoring: / EKG:  The patient was maintained on a cardiac monitor.  I personally viewed and interpreted the cardiac monitored which showed an underlying rhythm of: Normal sinus rhythm, no ST/T wave changes, no ischemic changes,  no STEMI   Consultations Obtained:  I requested consultation with the hospitalist,  and discussed lab and imaging findings as well as pertinent plan - they recommend: Admission   Problem List / ED Course / Critical interventions / Medication management  Patient is doing well at this time.  Discussed with patient we will plan for admission to the hospital service given her fluid overload.  She has been given dose of IV Lasix  in the emergency department.  Blood work is stable and at her baseline at this point except for her elevated BNP.  Chest x-ray does demonstrate small bilateral pleural effusions.  She is not requiring any oxygen supplementation at this point.  She notes that she has been compliant with her home furosemide .  Have discussed patient case with Dr. FORBES Carwin who has assessed the patient for admission at this time I ordered medication including Lasix  for fluid overload Reevaluation of the patient after these medicines showed that the patient improved I have reviewed the patients home medicines and have made adjustments as needed   Social Determinants of Health:  None   Test / Admission - Considered:  Admission     Final diagnoses:  None    ED Discharge Orders     None          Emily Jennings 03/10/24 1553    Emily [name] BROCKS, MD 03/11/24 [id number]

## 2024-03-10 NOTE — ED Triage Notes (Signed)
 Pt arrived via POV from urgent care for worsening SOB X 4 days and leg swelling X 2-3 weeks.

## 2024-03-10 NOTE — Assessment & Plan Note (Addendum)
 2+ pitting bilateral lower extremity edema, edema to upper extremities, abdominal bloating, reports 20 pound weight gain.  Chest x-ray -small bilateral pleural effusions.  Not hypoxic.  Elevated BNP 869, but she has had prior elevations to 1300s during recent very prolonged hospitalization for same.  Recent echo 01/2024, EF of 70 to 75%, G2DD.  She is currently taking Lasix  40 mg twice daily,.  Recent discharge summary she was supposed to be on torsemide  60 mg daily. - IV Lasix  60 mg x 1 given in ED, continue 40 twice daily -Strict input output, daily weight, daily BMP

## 2024-03-10 NOTE — Assessment & Plan Note (Addendum)
 A1c 7.4 - Resume long-acting insulin  Tresiba  6 units nightly - SSI- S

## 2024-03-10 NOTE — ED Provider Notes (Signed)
 [facility]    CSN: [id number] Arrival date & time: 03/10/24  1258      History   Chief Complaint Chief Complaint  Patient presents with   Leg Swelling   Shortness of Breath    HPI Emily Jennings is a 89 y.o. female.   Patient presenting today with acute on chronic diffuse edema, shortness of breath, fatigue that she notes for the last 2 to 3 weeks worse the last few days.  Per chart review was hospitalized on Jan 22, 2024 for similar issues and was found to be in a CHF exacerbation and acute on chronic renal failure.  Several of her medications were switched at time of discharge and she has tried to be compliant with these changes to include DC of Lasix  and initiation of torsemide .  Patient and husband told nurse at time of triage that they started several days ago back up on the Lasix  in addition to the torsemide  but then discussed with me just the torsemide  so unclear exactly how she is taking her diuretics at this time.  Also per chart review it appears primary care has reached out numerous times since time of discharge for a hospital follow-up visit as she was supposed to have a recheck within 1 week of discharge but these phone calls have gone unanswered.  She was also unable to attend her cardiology visit 02/22/2024.  Notes that she has not seen any healthcare providers since time of discharge.  Past medical history significant for CAD, uncontrolled diabetes, GERD, CHF, stage IV kidney disease, hypertension, hyperlipidemia.    Past Medical History:  Diagnosis Date   Allergy    Anxiety    ANXIETY DISORDER, GENERALIZED 07/15/2007   Qualifier: Diagnosis of  By: Aisha Motes     Arthritis    Cancer [facility]) 2009   breast, carcinoma in situ left   Carcinoma in situ of breast 05/21/2008   Qualifier: Diagnosis of  By: Antonetta MD, Margaret  Diagnosed in 2009, completed 5 year course of tamoxifen, no evidence of recurrence    Carotid stenosis    11/16/2005  mild plaque  formation and stenosis proximal right ECA   Cataract    Complication of anesthesia    Coronary artery disease    cardiac catheterization on 03/20/2006  LAD mid 40% stenosis, left circumflex mild 40% stenosis, RCA mid-vessel 40% to 50% lesion   EF 60%   Diabetes mellitus    GERD (gastroesophageal reflux disease)    Hernia, inguinal    left   Hyperglycemia    Hypertension    Insomnia 11/16/2011   Low blood potassium    Non-insulin  dependent type 2 diabetes mellitus (HCC)    Osteoporosis    Shortness of breath    2D Echocardiogram 01/26/2009   EF of greater than 55%, mild MR, mild TR, normal ventricular function   Thickened endometrium 10/26/2017   Noted by gyne in 2017, missed 6 month follow up, referred in 09/2017   Ventricular tachycardia, non-sustained (HCC)    developed during stress test 02/08/2006, spontaneously aborted, mild reversible apical defect    Patient Active Problem List   Diagnosis Date Noted   Acute diastolic heart failure (HCC) 02/04/2024   Anemia due to chronic kidney disease 02/04/2024   Type 2 diabetes mellitus with hyperglycemia (HCC) 01/22/2024   Insulin  long-term use (HCC) 12/05/2023   Type 2 diabetes mellitus with other specified complication (HCC) 09/25/2023   Acute kidney injury superimposed on stage 4 chronic kidney  disease (HCC) 08/14/2023   Small bowel obstruction (HCC) 07/19/2023   Ischemic bowel disease (HCC) 07/19/2023   Incarcerated left inguinal hernia 07/18/2023   Acute kidney injury superimposed on chronic kidney disease (HCC) 07/18/2023   Hypoglycemia 07/18/2023   Uncontrolled type 2 diabetes mellitus with hypoglycemia, with long-term current use of insulin  (HCC) 07/04/2023   Dysphagia 07/04/2023   HCAP (healthcare-associated pneumonia) 07/03/2023   Essential hypertension 06/15/2023   Acute on chronic renal failure (HCC) 06/02/2023   Acute diastolic CHF (congestive heart failure) (HCC) 06/01/2023   Cerebellar edema (HCC) 05/23/2023   Mixed  hyperlipidemia 05/23/2023   DNR (do not resuscitate) 05/17/2023   Vocal cord dysfunction 05/17/2023   DNR (do not resuscitate) discussion 05/17/2023   Goals of care, counseling/discussion 05/17/2023   Right cerebellar intraparenchymal hemorrhage due to hypertension 05/16/2023   Unsteady gait when walking 04/03/2023   Hospital discharge follow-up 03/06/2023   Bilateral kidney stones 03/06/2023   Hip swelling, right 02/27/2023   DKA (diabetic ketoacidosis) (HCC) 02/17/2023   High anion gap metabolic acidosis 02/17/2023   Pseudohyponatremia 02/17/2023   Hypoalbuminemia due to protein-calorie malnutrition (HCC) 02/17/2023   Dehydration 02/17/2023   Hip pain 02/15/2023   Encounter for Medicare annual examination with abnormal findings 10/16/2022   Vitamin D  deficiency 10/16/2022   Acute exacerbation of CHF (congestive heart failure) (HCC) 09/13/2022   Acute on chronic congestive heart failure with left ventricular diastolic dysfunction (HCC) 09/13/2022   Chronic kidney disease (CKD) stage G3b/A2, moderately decreased glomerular filtration rate (GFR) between 30-44 mL/min/1.73 square meter and albuminuria creatinine ratio between 30-299 mg/g (HCC) 09/13/2022   Mild aortic valve stenosis 09/13/2022   Dyspnea on exertion 09/05/2022   CHF (congestive heart failure) (HCC) 09/05/2022   Acute on chronic diastolic (congestive) heart failure (HCC) 09/05/2022   Hyperkalemia 04/09/2022   Hyperglycemic crisis due to diabetes mellitus (HCC) 03/25/2022   Hyponatremia 03/25/2022   Acute metabolic encephalopathy 03/22/2022   Hypothermia 03/22/2022   Heart murmur 03/22/2022   Bilateral lower extremity edema 03/22/2022   Syncope and collapse 01/13/2021   Lip swelling 10/30/2020   CKD stage 4 due to type 2 diabetes mellitus (HCC) 10/30/2020   Tubular adenoma of colon 10/26/2017   Diabetes (HCC) 07/27/2016   Osteoporosis 07/28/2014   Diabetic hypertension-nephrosis syndrome (HCC) 03/23/2014   Type 2  diabetes mellitus with stage 3a chronic kidney disease, with long-term current use of insulin  (HCC) 09/29/2013   Allergic rhinitis 01/27/2013   GERD (gastroesophageal reflux disease) 01/12/2013   CVD (cardiovascular disease) 07/17/2011   Hypokalemia 08/31/2008   Hyperlipidemia LDL goal <100 07/15/2007   Resistant hypertension 07/15/2007    Past Surgical History:  Procedure Laterality Date   BOWEL RESECTION Left 07/19/2023   Procedure: SMALL BOWEL RESECTION;  Surgeon: Kallie Manuelita BROCKS, MD;  Location: [facility];  Service: General;  Laterality: Left;   BREAST LUMPECTOMY Left 2009   Left breast 2009   CATARACT EXTRACTION W/PHACO Left 10/28/2014   Procedure: PHACO EMULSION CATARACT EXTRACTION WITH INTRAOCULAR LENS IMPLANT LEFT EYE (IOC);  Surgeon: Gaither Quan, MD;  Location[facility];  Service: Ophthalmology;  Laterality: Left;   COLONOSCOPY     cyst removed from left foot     INGUINAL HERNIA REPAIR Left 07/19/2023   Procedure: HERNIA REPAIR FEMORAL INCARCERATED;  Surgeon: Kallie Manuelita BROCKS, MD;  Location: [facility];  Service: General;  Laterality: Left;   REFRACTIVE SURGERY Left     OB History     Gravida  2   Para  2  Term  2   Preterm      AB      Living  2      SAB      IAB      Ectopic      Multiple      Live Births               Home Medications    Prior to Admission medications   Medication Sig Start Date End Date Taking? Authorizing Provider  albuterol  (VENTOLIN  HFA) 108 (90 Base) MCG/ACT inhaler Inhale 2 puffs into the lungs every 2 (two) hours as needed for wheezing or shortness of breath. 02/13/24  Yes Emokpae, Courage, MD  amLODipine  (NORVASC ) 10 MG tablet Take 1 tablet (10 mg total) by mouth daily. 02/13/24  Yes Pearlean Manus, MD  carvedilol  (COREG ) 12.5 MG tablet Take 1 tablet (12.5 mg total) by mouth 2 (two) times daily with a meal. 02/13/24  Yes Emokpae, Courage, MD  cloNIDine  (CATAPRES ) 0.3 MG tablet Take 1 tablet (0.3 mg total) by mouth 2 (two)  times daily. 02/13/24  Yes Emokpae, Courage, MD  furosemide  (LASIX ) 40 MG tablet Take 40 mg by mouth 2 (two) times daily.   Yes [provider]  hydrALAZINE  (APRESOLINE ) 100 MG tablet Take 1 tablet (100 mg total) by mouth 3 (three) times daily. 02/13/24  Yes Pearlean Manus, MD  insulin  degludec (TRESIBA ) 200 UNIT/ML FlexTouch Pen Inject 6 Units into the skin daily at 10 pm. 02/13/24  Yes Emokpae, Courage, MD  insulin  lispro (HUMALOG  KWIKPEN) 100 UNIT/ML KwikPen Inject 0-12 Units into the skin 3 (three) times daily. Short acting Humalog  insulin  per sliding scale 0-12 ---:-- Insulin  injection 0-12 Units 0-12 Units Subcutaneous, 3 times daily with meals CBG 70 - 120: 0 unit CBG 121 - 150: 0 unit  CBG 151 - 200: 2 unit CBG 201 - 250: 4 units CBG 251 - 300: 6 units CBG 301 - 350: 8 units  CBG 351 - 400: 10 units  CBG > 400: 12 units 02/13/24  Yes Emokpae, Courage, MD  isosorbide  mononitrate (IMDUR ) 60 MG 24 hr tablet Take 1 tablet (60 mg total) by mouth daily. 02/13/24  Yes Emokpae, Courage, MD  torsemide  (DEMADEX ) 20 MG tablet Take 3 tablets (60 mg total) by mouth daily. 02/13/24  Yes Emokpae, Courage, MD  magnesium  oxide (MAG-OX) 400 (240 Mg) MG tablet Take 1 tablet (400 mg total) by mouth daily. 02/13/24   Pearlean Manus, MD  Potassium Chloride  ER 20 MEQ TBCR Take 1 tablet by mouth daily. 02/10/24   [provider]  RESTASIS  0.05 % ophthalmic emulsion Place 1 drop into both eyes 2 (two) times daily. 11/01/23   [provider]  rosuvastatin  (CRESTOR ) 5 MG tablet Take 1 tablet (5 mg total) by mouth daily. 02/13/24   Pearlean Manus, MD    Family History Family History  Problem Relation Age of Onset   Hypertension Mother    Hyperlipidemia Mother    Stroke Mother    Urticaria Mother    Cancer Father        pancreatic   Colon cancer Father    Heart disease Brother 40       bypass   Heart disease Brother 47       bypass   Arthritis Other    Asthma Other    Diabetes Other     Colon cancer Paternal Aunt    Esophageal cancer Neg Hx    Stomach cancer Neg Hx  Rectal cancer Neg Hx     Social History Social History   Tobacco Use   Smoking status: Never    Passive exposure: Never   Smokeless tobacco: Never  Vaping Use   Vaping status: Never Used  Substance Use Topics   Alcohol use: No   Drug use: No     Allergies   Farxiga [dapagliflozin], Metformin  and related, Benadryl [diphenhydramine hcl], Citalopram, Tramadol , Amlodipine , and Crestor  [rosuvastatin ]   Review of Systems Review of Systems Per HPI  Physical Exam Triage Vital Signs ED Triage Vitals  Encounter Vitals Group     BP 03/10/24 1308 (!) 140/62     Girls Systolic BP Percentile --      Girls Diastolic BP Percentile --      Boys Systolic BP Percentile --      Boys Diastolic BP Percentile --      Pulse Rate 03/10/24 1308 61     Resp 03/10/24 1308 18     Temp 03/10/24 1308 (!) 97.5 F (36.4 C)     Temp Source 03/10/24 1308 Oral     SpO2 03/10/24 1308 95 %     Weight --      Height --      Head Circumference --      Peak Flow --      Pain Score 03/10/24 1309 0     Pain Loc --      Pain Education --      Exclude from Growth Chart --    No data found.  Updated Vital Signs BP (!) 140/62 (BP Location: Left Arm)   Pulse 61   Temp (!) 97.5 F (36.4 C) (Oral)   Resp 18   SpO2 95%   Visual Acuity Right Eye Distance:   Left Eye Distance:   Bilateral Distance:    Right Eye Near:   Left Eye Near:    Bilateral Near:     Physical Exam Vitals and nursing note reviewed.  Constitutional:      Appearance: Normal appearance. She is not ill-appearing.  HENT:     Head: Atraumatic.     Mouth/Throat:     Mouth: Mucous membranes are moist.   Eyes:     Extraocular Movements: Extraocular movements intact.     Conjunctiva/sclera: Conjunctivae normal.    Cardiovascular:     Rate and Rhythm: Normal rate.  Pulmonary:     Effort: Pulmonary effort is normal.     Comments:  Breathing comfortably, speaking in full sentences  Musculoskeletal:        General: Swelling present.     Cervical back: Normal range of motion and neck supple.     Comments: In wheelchair currently, unclear baseline ambulatory status  Appreciable edema to the right hand, bilateral lower extremities worse on the left   Skin:    General: Skin is warm and dry.   Neurological:     Mental Status: She is alert and oriented to person, place, and time.   Psychiatric:        Mood and Affect: Mood normal.        Thought Content: Thought content normal.        Judgment: Judgment normal.      UC Treatments / Results  Labs (all labs ordered are listed, but only abnormal results are displayed) Labs Reviewed - No data to display  EKG   Radiology DG Chest 2 View Result Date: 03/10/2024 CLINICAL DATA:  Shortness of breath, leg swelling EXAM:  CHEST - 2 VIEW COMPARISON:  02/04/2024 FINDINGS: Small bilateral pleural effusions. Mild cardiomegaly. Aortic atherosclerosis. Minimal bibasilar opacities, likely atelectasis. No overt edema. No acute bony abnormality. IMPRESSION: Cardiomegaly. Small bilateral pleural effusions with bibasilar atelectasis. Electronically Signed   By: Franky Crease M.D.   On: 03/10/2024 15:12    Procedures Procedures (including critical care time)  Medications Ordered in UC Medications - No data to display  Initial Impression / Assessment and Plan / UC Course  I have reviewed the triage vital signs and the nursing notes.  Pertinent labs & imaging results that were available during my care of the patient were reviewed by me and considered in my medical decision making (see chart for details).     Mildly hypertensive in triage today, otherwise vital signs within normal limits.  She is in no acute distress.  Given the duration and worsening course of symptoms as well as the complexity of her chronic conditions do feel that she would be more safely managed in the  emergency department where more resources and immediate results will be available.  Patient and husband who is with her today are agreeable to this plan and wished to go via private vehicle.  She is currently hemodynamically stable for private vehicle transport and declines EMS transport.  Final Clinical Impressions(s) / UC Diagnoses   Final diagnoses:  SOB (shortness of breath)  Edema, unspecified type  CKD (chronic kidney disease) stage 4, GFR 15-29 ml/min (HCC)  Type 2 diabetes mellitus with hyperglycemia, with long-term current use of insulin  (HCC)  Chronic congestive heart failure, unspecified heart failure type [facility])   Discharge Instructions   None    ED Prescriptions   None    PDMP not reviewed this encounter.   Stuart Vernell Norris, [place] 03/10/24 [phone]

## 2024-03-10 NOTE — Assessment & Plan Note (Signed)
 CKD stage IV.  Creatinine stable at 2.84. - Monitor with diuresis

## 2024-03-10 NOTE — H&P (Addendum)
 History and Physical    Emily Jennings FMW:[id number] DOB: 10/15/1933 DOA: 03/10/2024  PCP: Antonetta Rollene BRAVO, MD   Patient coming from: Leg swelling,   I have personally briefly reviewed patient's old medical records in [facility]  Chief Complaint: Leg swelling   HPI: Emily Jennings is a 89 y.o. female with medical history significant for diastolic CHF, hypertension, CKD, diabetes mellitus. Patient presented to the ED with complaints of lower extremity swelling and difficulty breathing.  Patient has chronic swelling to her lower extremities, but she reports over the past 2 to 3 weeks, swelling has significantly worsened, she also reports swelling to her abdomen and arms.  She reports difficulty breathing over the past 3 to 4 days.  No chest pain.  Reports mild cough.  She reports 20 pound weight gain over the past few weeks.  She reports compliance with her medication Lasix  40 twice daily-she shows me the pill bottle. She also reports difficulty swallowing over the past 3 to 4 weeks, she denies pain with swallowing, reports is harder for food to go down her throat.  No coughing or choking with meals.  Recent prolonged hospitalization 5/19 to 6/14-for decompensated CHF, AKI on CKD 4, hypervolemic per natremia, hypertensive urgency.  Per notes she was discharged on torsemide  60 mg daily.  ED Course: Temperature 97.1.  Heart rate 50s.  Respiratory rate 10 -17.  Blood pressure systolic 130s to 849d.  O2 sats 98% on room air. Sodium 127.  BNP 869.  Troponin 9. Chest x-ray shows cardiomegaly, small bilateral pleural effusions.  Review of Systems: As per HPI all other systems reviewed and negative.  Past Medical History:  Diagnosis Date   Allergy    Anxiety    ANXIETY DISORDER, GENERALIZED 07/15/2007   Qualifier: Diagnosis of  By: Aisha Motes     Arthritis    Cancer [facility]) 2009   breast, carcinoma in situ left   Carcinoma in situ of breast 05/21/2008   Qualifier: Diagnosis of  By:  Antonetta MD, Margaret  Diagnosed in 2009, completed 5 year course of tamoxifen, no evidence of recurrence    Carotid stenosis    11/16/2005  mild plaque formation and stenosis proximal right ECA   Cataract    Complication of anesthesia    Coronary artery disease    cardiac catheterization on 03/20/2006  LAD mid 40% stenosis, left circumflex mild 40% stenosis, RCA mid-vessel 40% to 50% lesion   EF 60%   Diabetes mellitus    GERD (gastroesophageal reflux disease)    Hernia, inguinal    left   Hyperglycemia    Hypertension    Insomnia 11/16/2011   Low blood potassium    Non-insulin  dependent type 2 diabetes mellitus (HCC)    Osteoporosis    Shortness of breath    2D Echocardiogram 01/26/2009   EF of greater than 55%, mild MR, mild TR, normal ventricular function   Thickened endometrium 10/26/2017   Noted by gyne in 2017, missed 6 month follow up, referred in 09/2017   Ventricular tachycardia, non-sustained (HCC)    developed during stress test 02/08/2006, spontaneously aborted, mild reversible apical defect    Past Surgical History:  Procedure Laterality Date   BOWEL RESECTION Left 07/19/2023   Procedure: SMALL BOWEL RESECTION;  Surgeon: Kallie Manuelita BROCKS, MD;  Location: [facility];  Service: General;  Laterality: Left;   BREAST LUMPECTOMY Left 2009   Left breast 2009   CATARACT EXTRACTION W/PHACO Left 10/28/2014   Procedure:  PHACO EMULSION CATARACT EXTRACTION WITH INTRAOCULAR LENS IMPLANT LEFT EYE (IOC);  Surgeon: Gaither Quan, MD;  Location: [facility] OR;  Service: Ophthalmology;  Laterality: Left;   COLONOSCOPY     cyst removed from left foot     INGUINAL HERNIA REPAIR Left 07/19/2023   Procedure: HERNIA REPAIR FEMORAL INCARCERATED;  Surgeon: Kallie Manuelita BROCKS, MD;  Location: [facility];  Service: General;  Laterality: Left;   REFRACTIVE SURGERY Left      reports that she has never smoked. She has never been exposed to tobacco smoke. She has never used smokeless tobacco. She reports that she does  not drink alcohol and does not use drugs.  Allergies  Allergen Reactions   Farxiga [Dapagliflozin] Other (See Comments)    DKA   Metformin  And Related Diarrhea    Lost appetite and weight    Benadryl [Diphenhydramine Hcl] Hypertension   Citalopram Other (See Comments)    Unknown    Tramadol  Other (See Comments)    Felt light headed and dizzy   Amlodipine  Swelling   Crestor  [Rosuvastatin ] Swelling and Other (See Comments)    Feet swelling Makes her feel weak. Pt takes 5mg  at home    Family History  Problem Relation Age of Onset   Hypertension Mother    Hyperlipidemia Mother    Stroke Mother    Urticaria Mother    Cancer Father        pancreatic   Colon cancer Father    Heart disease Brother 40       bypass   Heart disease Brother 75       bypass   Arthritis Other    Asthma Other    Diabetes Other    Colon cancer Paternal Aunt    Esophageal cancer Neg Hx    Stomach cancer Neg Hx    Rectal cancer Neg Hx     Prior to Admission medications   Medication Sig Start Date End Date Taking? Authorizing Provider  albuterol  (VENTOLIN  HFA) 108 (90 Base) MCG/ACT inhaler Inhale 2 puffs into the lungs every 2 (two) hours as needed for wheezing or shortness of breath. 02/13/24   Pearlean, Courage, MD  amLODipine  (NORVASC ) 10 MG tablet Take 1 tablet (10 mg total) by mouth daily. 02/13/24   Pearlean Manus, MD  calcitRIOL  (ROCALTROL ) 0.25 MCG capsule Take 0.25 mcg by mouth 3 (three) times a week. 02/28/24   [provider]  carvedilol  (COREG ) 12.5 MG tablet Take 1 tablet (12.5 mg total) by mouth 2 (two) times daily with a meal. 02/13/24   Virgilia Quigg, Courage, MD  cloNIDine  (CATAPRES ) 0.3 MG tablet Take 1 tablet (0.3 mg total) by mouth 2 (two) times daily. 02/13/24   Pearlean Manus, MD  furosemide  (LASIX ) 40 MG tablet Take 40 mg by mouth.    [provider]  hydrALAZINE  (APRESOLINE ) 100 MG tablet Take 1 tablet (100 mg total) by mouth 3 (three) times daily. 02/13/24   Pearlean Manus, MD  insulin  degludec (TRESIBA ) 200 UNIT/ML FlexTouch Pen Inject 6 Units into the skin daily at 10 pm. 02/13/24   Pearlean Manus, MD  insulin  lispro (HUMALOG  KWIKPEN) 100 UNIT/ML KwikPen Inject 0-12 Units into the skin 3 (three) times daily. Short acting Humalog  insulin  per sliding scale 0-12 ---:-- Insulin  injection 0-12 Units 0-12 Units Subcutaneous, 3 times daily with meals CBG 70 - 120: 0 unit CBG 121 - 150: 0 unit  CBG 151 - 200: 2 unit CBG 201 - 250: 4 units CBG 251 - 300: 6  units CBG 301 - 350: 8 units  CBG 351 - 400: 10 units  CBG > 400: 12 units 02/13/24   Malyk Girouard, Courage, MD  isosorbide  mononitrate (IMDUR ) 60 MG 24 hr tablet Take 1 tablet (60 mg total) by mouth daily. 02/13/24   Pearlean Manus, MD  magnesium  oxide (MAG-OX) 400 (240 Mg) MG tablet Take 1 tablet (400 mg total) by mouth daily. 02/13/24   Pearlean Manus, MD  Potassium Chloride  ER 20 MEQ TBCR Take 1 tablet by mouth daily. 02/10/24   [provider]  RESTASIS  0.05 % ophthalmic emulsion Place 1 drop into both eyes 2 (two) times daily. 11/01/23   [provider]  rosuvastatin  (CRESTOR ) 5 MG tablet Take 1 tablet (5 mg total) by mouth daily. 02/13/24   Pearlean Manus, MD  torsemide  (DEMADEX ) 20 MG tablet Take 3 tablets (60 mg total) by mouth daily. 02/13/24   Pearlean Manus, MD    Physical Exam: Vitals:   03/10/24 1410 03/10/24 1501  BP:  (!) 138/93  Pulse:  (!) 59  Resp:  17  SpO2:  100%  Weight: 59 kg   Height: 5' (1.524 m)     Constitutional: NAD, calm, comfortable Vitals:   03/10/24 1410 03/10/24 1501  BP:  (!) 138/93  Pulse:  (!) 59  Resp:  17  SpO2:  100%  Weight: 59 kg   Height: 5' (1.524 m)    Eyes: PERRL, lids and conjunctivae normal ENMT: Mucous membranes are moist. Neck: normal, supple, no masses, no thyromegaly Respiratory: Increased work of breathing with long sentences, No accessory muscle use.  Cardiovascular: Regular rate and rhythm, no murmurs / rubs / gallops. 2+  pitting bilateral extremity edema to knees.  Hands appear puffy.  Extremities warm. Abdomen: Soft, no tenderness, no masses palpated. No hepatosplenomegaly. Bowel sounds positive.  Musculoskeletal: no clubbing / cyanosis. No joint deformity upper and lower extremities.  Neurologic: No facial asymmetry, moving extremities spontaneously, speech fluent.    Psychiatric: Normal judgment and insight. Alert and oriented x 3. Normal mood.   Labs on Admission: I have personally reviewed following labs and imaging studies  CBC: Recent Labs  Lab 03/10/24 1447  WBC 3.2*  NEUTROABS 2.1  HGB 8.5*  HCT 25.3*  MCV 91.0  PLT 162   Basic Metabolic Panel: Recent Labs  Lab 03/10/24 1447  NA 127*  K 3.5  CL 87*  CO2 29  GLUCOSE 172*  BUN 66*  CREATININE 2.84*  CALCIUM  8.6*   Liver Function Tests: Recent Labs  Lab 03/10/24 1447  AST 23  ALT 11  ALKPHOS 63  BILITOT 0.5  PROT 5.0*  ALBUMIN  2.9*   Urine analysis:    Component Value Date/Time   COLORURINE STRAW (A) 01/25/2024 0821   APPEARANCEUR CLEAR 01/25/2024 0821   LABSPEC 1.007 01/25/2024 0821   PHURINE 5.0 01/25/2024 0821   GLUCOSEU 150 (A) 01/25/2024 0821   HGBUR NEGATIVE 01/25/2024 0821   HGBUR trace-intact 04/28/2010 1259   BILIRUBINUR NEGATIVE 01/25/2024 0821   BILIRUBINUR neg 09/29/2013 1341   KETONESUR NEGATIVE 01/25/2024 0821   PROTEINUR >=300 (A) 01/25/2024 0821   UROBILINOGEN 0.2 09/29/2013 1341   UROBILINOGEN 0.2 12/28/2010 1743   NITRITE NEGATIVE 01/25/2024 0821   LEUKOCYTESUR NEGATIVE 01/25/2024 0821    Radiological Exams on Admission: DG Chest 2 View Result Date: 03/10/2024 CLINICAL DATA:  Shortness of breath, leg swelling EXAM: CHEST - 2 VIEW COMPARISON:  02/04/2024 FINDINGS: Small bilateral pleural effusions. Mild cardiomegaly. Aortic atherosclerosis. Minimal bibasilar opacities, likely  atelectasis. No overt edema. No acute bony abnormality. IMPRESSION: Cardiomegaly. Small bilateral pleural effusions with  bibasilar atelectasis. Electronically Signed   By: Franky Crease M.D.   On: 03/10/2024 15:12    EKG: Independently reviewed.  Sinus rhythm, rate 60, QTc 431.  No significant change from prior.  Assessment/Plan Active Problems:   Acute on chronic congestive heart failure with left ventricular diastolic dysfunction (HCC)   Hyponatremia   CKD stage 4 due to type 2 diabetes mellitus (HCC)   DNR (do not resuscitate) discussion   Essential hypertension   Type 2 diabetes mellitus with other specified complication (HCC)    Assessment and Plan: Acute on chronic congestive heart failure with left ventricular diastolic dysfunction (HCC) 2+ pitting bilateral lower extremity edema, edema to upper extremities, abdominal bloating, reports 20 pound weight gain.  Chest x-ray -small bilateral pleural effusions.  Not hypoxic.  Elevated BNP 869, but she has had prior elevations to 1300s during recent very prolonged hospitalization for same.  Recent echo 01/2024, EF of 70 to 75%, G2DD.  She is currently taking Lasix  40 mg twice daily,.  Recent discharge summary she was supposed to be on torsemide  60 mg daily. - IV Lasix  60 mg x 1 given in ED, continue 40 twice daily -Strict input output, daily weight, daily BMP  Hyponatremia Chronic hyponatremia - hypervolemic hyponatremia.  Sodium 127.  At baseline.  CKD stage 4 due to type 2 diabetes mellitus (HCC) CKD stage IV.  Creatinine stable at 2.84. - Monitor with diuresis  Type 2 diabetes mellitus with other specified complication (HCC) A1c 7.4 - Resume long-acting insulin  Tresiba  6 units nightly - SSI- S  Essential hypertension Stable.  On multiple antihypertensive agents -Resume Norvasc , carvedilol  12.5 mg twice daily, clonidine  0.3 mg twice daily,  Imdur  - Pending stable blood pressure, resume hydralazine  100 mg 3 times daily in a.m.  Dysphagia -GI eval   DVT prophylaxis:  Heparin  Code Status: DNR- Confirmed with patient, spouse and son at  bedside. Family Communication: SPouse and son at bedside Disposition Plan: > 2 days Consults called: None Admission status:  Inpt Tele I certify that at the point of admission it is my clinical judgment that the patient will require inpatient hospital care spanning beyond 2 midnights from the point of admission due to high intensity of service, high risk for further deterioration and high frequency of surveillance required.    Author: Tully FORBES Carwin, MD 03/10/2024 7:02 PM  For on call review [email or web address].

## 2024-03-10 NOTE — ED Triage Notes (Signed)
 Pt c/o swelling to right arm/hand also abdomin and legs that started 2-3 weeks ago. Pt states she is also having SOB x 3-4 days that got worse today.

## 2024-03-11 DIAGNOSIS — R1310 Dysphagia, unspecified: Secondary | ICD-10-CM

## 2024-03-11 DIAGNOSIS — I5033 Acute on chronic diastolic (congestive) heart failure: Secondary | ICD-10-CM | POA: Diagnosis not present

## 2024-03-11 LAB — BASIC METABOLIC PANEL WITH GFR
Anion gap: 10 (ref 5–15)
BUN: 62 mg/dL — ABNORMAL HIGH (ref 8–23)
CO2: 28 mmol/L (ref 22–32)
Calcium: 8.2 mg/dL — ABNORMAL LOW (ref 8.9–10.3)
Chloride: 90 mmol/L — ABNORMAL LOW (ref 98–111)
Creatinine, Ser: 2.73 mg/dL — ABNORMAL HIGH (ref 0.44–1.00)
GFR, Estimated: 16 mL/min — ABNORMAL LOW (ref >60)
Glucose, Bld: 174 mg/dL — ABNORMAL HIGH (ref 70–99)
Potassium: 3.4 mmol/L — ABNORMAL LOW (ref 3.5–5.1)
Sodium: 128 mmol/L — ABNORMAL LOW (ref 135–145)

## 2024-03-11 LAB — GLUCOSE, CAPILLARY
Glucose-Capillary: 118 mg/dL — ABNORMAL HIGH (ref 70–99)
Glucose-Capillary: 208 mg/dL — ABNORMAL HIGH (ref 70–99)
Glucose-Capillary: 247 mg/dL — ABNORMAL HIGH (ref 70–99)
Glucose-Capillary: 287 mg/dL — ABNORMAL HIGH (ref 70–99)

## 2024-03-11 MED ORDER — POTASSIUM CHLORIDE 20 MEQ PO PACK
40.0000 meq | PACK | Freq: Two times a day (BID) | ORAL | Status: DC
  Administered 2024-03-11 – 2024-03-12 (×2): 40 meq via ORAL
  Filled 2024-03-11 (×2): qty 2

## 2024-03-11 MED ORDER — INSULIN ASPART 100 UNIT/ML IJ SOLN
0.0000 [IU] | Freq: Three times a day (TID) | INTRAMUSCULAR | Status: DC
  Administered 2024-03-12: 8 [IU] via SUBCUTANEOUS
  Administered 2024-03-12: 5 [IU] via SUBCUTANEOUS

## 2024-03-11 NOTE — Progress Notes (Signed)
 Mobility Specialist Progress Note:    03/11/24 1428  Mobility  Activity Ambulated with assistance in room;Stood at bedside;Transferred to/from [facility]  Level of Assistance Contact guard assist, steadying assist  Assistive Device None  Distance Ambulated (ft) 2 ft  Range of Motion/Exercises Active;All extremities  Activity Response Tolerated well  Mobility Referral Yes  Mobility visit 1 Mobility  Mobility Specialist Start Time (ACUTE ONLY) 1410  Mobility Specialist Stop Time (ACUTE ONLY) 1428  Mobility Specialist Time Calculation (min) (ACUTE ONLY) 18 min   Pt received in bed requesting assistance to [facility]. Required CGA to stand and pivot with no AD. Tolerated well, asx throughout. Left on [facility] with nurse, all needs met.   Sherrilee Ditty Mobility Specialist Please contact via Special educational needs teacher or  Rehab office at [phone]

## 2024-03-11 NOTE — Evaluation (Signed)
 Clinical/Bedside Swallow Evaluation Patient Details  Name: Emily Jennings MRN: [id number] Date of Birth: 1934/07/05  Today's Date: 03/11/2024 Time: SLP Start Time (ACUTE ONLY): 1045 SLP Stop Time (ACUTE ONLY): 1114 SLP Time Calculation (min) (ACUTE ONLY): 29 min  Past Medical History:  Past Medical History:  Diagnosis Date   Allergy    Anxiety    ANXIETY DISORDER, GENERALIZED 07/15/2007   Qualifier: Diagnosis of  By: Aisha Motes     Arthritis    Cancer [facility]) 2009   breast, carcinoma in situ left   Carcinoma in situ of breast 05/21/2008   Qualifier: Diagnosis of  By: Antonetta MD, Margaret  Diagnosed in 2009, completed 5 year course of tamoxifen, no evidence of recurrence    Carotid stenosis    11/16/2005  mild plaque formation and stenosis proximal right ECA   Cataract    Complication of anesthesia    Coronary artery disease    cardiac catheterization on 03/20/2006  LAD mid 40% stenosis, left circumflex mild 40% stenosis, RCA mid-vessel 40% to 50% lesion   EF 60%   Diabetes mellitus    GERD (gastroesophageal reflux disease)    Hernia, inguinal    left   Hyperglycemia    Hypertension    Insomnia 11/16/2011   Low blood potassium    Non-insulin  dependent type 2 diabetes mellitus (HCC)    Osteoporosis    Shortness of breath    2D Echocardiogram 01/26/2009   EF of greater than 55%, mild MR, mild TR, normal ventricular function   Thickened endometrium 10/26/2017   Noted by gyne in 2017, missed 6 month follow up, referred in 09/2017   Ventricular tachycardia, non-sustained (HCC)    developed during stress test 02/08/2006, spontaneously aborted, mild reversible apical defect   Past Surgical History:  Past Surgical History:  Procedure Laterality Date   BOWEL RESECTION Left 07/19/2023   Procedure: SMALL BOWEL RESECTION;  Surgeon: Kallie Manuelita BROCKS, MD;  Location: [facility];  Service: General;  Laterality: Left;   BREAST LUMPECTOMY Left 2009   Left breast 2009   CATARACT EXTRACTION  W/PHACO Left 10/28/2014   Procedure: PHACO EMULSION CATARACT EXTRACTION WITH INTRAOCULAR LENS IMPLANT LEFT EYE (IOC);  Surgeon: Gaither Quan, MD;  Location[facility] OR;  Service: Ophthalmology;  Laterality: Left;   COLONOSCOPY     cyst removed from left foot     INGUINAL HERNIA REPAIR Left 07/19/2023   Procedure: HERNIA REPAIR FEMORAL INCARCERATED;  Surgeon: Kallie Manuelita BROCKS, MD;  Location: [facility];  Service: General;  Laterality: Left;   REFRACTIVE SURGERY Left    HPI:  Pt is an 89 y.o. female admitted 6/23 due to acute on chronic CHF. PMHx significant for: diastolic CHF, HTN, CKD, diabetes mellitus, GERD, inguinal hernia, GAD, nontraumatic intracerebral hemorrhage R cerebellum (Aug 2024). Per H&P, pt reports difficulty swallowing over the past 3 to 4 weeks, she denies pain with swallowing, reports is harder for food to go down her throat.  No coughing or choking with meals. ENT consult during [facility] admission 04/2023 revealed no vocal cord dysfunction (see below). Pt known to ST service for c/o difficulty swallowing, last seen in 06/2023 where a Dys2/thin diet was recommended. ST order requested for a bedside swallow eval given c/o dysphagia.       GI Consult 03/11/24: New onset symptom of foods going down her esophagus slowly for the last 3 to 4 weeks.  Patient felt this was secondary to generalized edema, and feels that symptoms are improving since being  admitted and receiving IV diuretics.  No prior EGD.  No chronic GERD, nausea, vomiting.  We discussed possibility of EGD to evaluate her symptoms, but she prefers to avoid this if possible.  Will arrange barium pill esophagram for now.  If need for EGD is identified on BPE, this would need to be arranged once improved from CHF standpoint.  ENT Consult 05/18/23: She underwent a fiberoptic exam placing the scope through the left side of her nose.  Her airway looks normal with no swelling or lesions.  Vocal cords appear to move normally although there is  occasionally an inappropriate adduction and she grunts a lot.  Overall her airway is wide open while she is experiencing shortness of breath.  I never was able to visualize the her airway while the wheezing was going on.  There is definitely no evidence of anything obstructive.  It does not appear there is any subglottic narrowing.  There is a slight pseudo sulcus suggesting reflux.    Assessment / Plan / Recommendation  Clinical Impression  Pt endorses dysphagia with solids and liquids but was unable to state which foods are most difficult for her. Denies odynophagia and globus sensation. Endorses recent PNA, hx of reflux, and difficulty with mastication. Diet prior to BSE is reg/thin. RN reports no issues with meds whole with thin. She endorses baseline memory deficits and therefore may be a poor historian of her symptoms - she noted dysphagia onset was 3-4 weeks ago but later stated she felt they have been happening since her intracranial hemorrhage in 2024. Chart review shows similar complaints in October 2024 as well. She did state that her dysphagia sx have improved during her hospitalization, consistent with what she mentioned to GI specialist this AM. Oral mech revealed mildly reduced lingual strength to the R, upper dentures with natural teeth on bottom (some are missing). She consumed ice chips, thin liquid water via cup and straw, mech soft fruit (mixed consistency), and regular texture graham cracker. Prolonged but effective mastication and mild lingual/L buccal residue noted with regular textures (cleared with lingual sweep completed IND by pt). Delayed cough x1 with mixed consistency. Belching noted throughout trials, concerning for esophageal dysphagia. Otherwise, no s/x of overt aspiration.   Pt presents with a mild oral phase dysphagia leading to difficulty with mastication and s/sx of possible esophageal dysphagia (pt followed by GI).  Discussed that ST recommendation is Dys 3 with thin,  meds whole with thin (one at a time). Provided education on rationale for recommendation and pt verbalized understanding. Pt politely declined diet texture change and would like to remain on regular textures. No ST services warranted at this time. Please reconsult should any changes or concerns arise.   SLP Visit Diagnosis: Dysphagia, oral phase (R13.11)    Aspiration Risk  Risk for inadequate nutrition/hydration    Diet Recommendation Dysphagia 3 (Mech soft);Thin liquid  (Of note, pt declined recommendation as she wants to remain on Regular with thin at this time, see above)  Liquid Administration via: Straw;Cup Medication Administration: Other (Comment) (Whole meds with liquid (one at a time)) Supervision: Patient able to self feed Compensations: Minimize environmental distractions;Slow rate;Small sips/bites;Follow solids with liquid Postural Changes: Seated upright at 90 degrees;Remain upright for at least 30 minutes after po intake    Other  Recommendations Recommended Consults: Consider GI evaluation Oral Care Recommendations: Oral care BID     Assistance Recommended at Discharge  No ST services recommended at d/c   Functional Status Assessment Patient  has had a recent decline in their functional status and demonstrates the ability to make significant improvements in function in a reasonable and predictable amount of time.       Swallow Study   General HPI: Pt is an 89 y.o. female admitted 6/23 due to acute on chronic CHF. PMHx significant for: diastolic CHF, HTN, CKD, diabetes mellitus, GERD, inguinal hernia, GAD, nontraumatic intracerebral hemorrhage R cerebellum (Aug 2024). Per H&P, pt reports difficulty swallowing over the past 3 to 4 weeks, she denies pain with swallowing, reports is harder for food to go down her throat.  No coughing or choking with meals. ENT consult during [facility] admission 04/2023 revealed no vocal cord dysfunction. Pt known to ST service for c/o difficulty  swallowing, last seen in 06/2023 where a Dys2/thin diet was recommended. ST order requested for a bedside swallow eval given c/o dysphagia.  Type of Study: Bedside Swallow Evaluation Previous Swallow Assessment: yes Diet Prior to this Study: Regular;Thin liquids (Level 0) Temperature Spikes Noted: No Respiratory Status: Room air History of Recent Intubation: No Behavior/Cognition: Alert;Cooperative;Pleasant mood Oral Cavity Assessment: Other (comment) (missing dentition) Oral Care Completed by SLP: No Oral Cavity - Dentition: Dentures, top;Missing dentition Vision: Functional for self-feeding Self-Feeding Abilities: Able to feed self Patient Positioning: Upright in chair Baseline Vocal Quality: Breathy Volitional Cough: Strong Volitional Swallow: Able to elicit    Oral/Motor/Sensory Function Overall Oral Motor/Sensory Function: Mild impairment Facial ROM: Within Functional Limits Facial Symmetry: Within Functional Limits Facial Strength: Within Functional Limits Facial Sensation: Within Functional Limits Lingual ROM: Reduced left Lingual Symmetry: Within Functional Limits Lingual Strength: Reduced Velum: Within Functional Limits Mandible: Within Functional Limits   Ice Chips Ice chips: Within functional limits Presentation: Spoon   Thin Liquid Thin Liquid: Within functional limits Presentation: Cup;Spoon;Self Fed    Nectar Thick Nectar Thick Liquid: Not tested   Honey Thick Honey Thick Liquid: Not tested   Puree Puree: Not tested   Solid     Solid: Impaired Presentation: Self Fed Oral Phase Impairments: Impaired mastication Oral Phase Functional Implications: Impaired mastication Pharyngeal Phase Impairments: Cough - Delayed;Other (comments) (with mixed consistency)      Waddell JONETTA Novak, MA CCC-SLP 03/11/2024,11:23 AM

## 2024-03-11 NOTE — Progress Notes (Signed)
  Progress Note   Patient: Emily Jennings FMW:[id number] DOB: 15-Nov-1933 DOA: 03/10/2024     1 DOS: the patient was seen and examined on 03/11/2024 at 7:55AM      Brief hospital course: 89 y.o. F with dCHF, DM, CKD IV baseline 2.8 who presented with progressive lower extremity swelling and shortness of breath with exertion for several days as well as 20 pound weight gain.  During her recent admission, she was discharged on torsemide , however on presentation here, she shows her diuretic bottle of furosemide .  In the ER, found to have elevated BNP, chest x-ray with bilateral pleural effusions, admitted on IV Lasix .     Assessment and Plan: * Acute on chronic congestive heart failure with left ventricular diastolic dysfunction (HCC) Net negative only 500 cc overnight.  Creatinine stable.  Still with pitting edema in the ankles. Recent echo showed EF 70%, grade II DD - Continue IV Lasix , increase to 40 twice daily - Daily BMP -Daily weights, strict ins and outs - Would benefit from [facility] or community paramedicine given confusion re: furosemide /torsemide  after last discharge    Dysphagia Reported symptom in the ER, patient denies to me although her memory appears impaired. On my arrival to the room, she is eating breakfast comfortably, but avoiding bread and meat. In discussion with husband, he reports the main reason they presented to the hospital  was trouble swallowing' and trouble breathing because of swelling in her chest which he interpreted to mean, swelling in her throat/esophagus - Consult GI, appreciate cares - Consult speech therapy  Hyponatremia Hypervolemic, sodium up to 128 with diuresis  CKD stage 4 due to type 2 diabetes mellitus (HCC) Creatinine stable at 2.7 - Daily BMP while on diuretics  Type 2 diabetes mellitus with other specified complication (HCC) A1c 7.4 Hyperglycemic here - Continue long-acting insulin  - Continue sliding scale corrections, increase  dose  Essential hypertension Blood pressure elevated -Continue amlodipine , carvedilol , clonidine , Imdur  -Resume hydralazine   Hypokalemia - Supplement K        Subjective: Patient is in good spirits.  She has not been out of bed, but she still has edema.  She seems pleasantly confused.  No fever, no acute dsitress.     Physical Exam: BP (!) 157/65 (BP Location: Right Arm)   Pulse 63   Temp 97.7 F (36.5 C) (Axillary)   Resp 18   Ht 5' (1.524 m)   Wt 57.4 kg   SpO2 98%   BMI 24.71 kg/m   Elderly adult female, sitting up in recliner eating breakfast, interactive and appropriate Heart rate regular, soft systolic murmur, pitting edema to the midshin Respiratory rate normal, lung sounds diminished, crackles at both bases Abdomen soft, no tenderness to palpation, no ascites or distention Attention normal, affect pleasant, judgment and insight appear impaired but at baseline, face symmetric, generalized weakness    Data Reviewed: Basic metabolic panel shows persistent hyponatremia, mild hypokalemia, creatinine stable at 2.7 CBC shows chronic anemia, unchanged    Family Communication: husband by phone    Disposition: Status is: Inpatient 89 yo F with dCHF readmitted with CHF.  On diuretics.  If edema improved, breathing at baseline and GI eval done, likely home in 1-2 days        Author: Lonni SHAUNNA Dalton, MD 03/11/2024 5:39 PM  For on call review [email or web address].

## 2024-03-11 NOTE — Hospital Course (Signed)
 89 y.o. F with dCHF, DM, CKD IV baseline 2.8 who presented with progressive lower extremity swelling and shortness of breath with exertion for several days as well as 20 pound weight gain.  During her recent admission, she was discharged on torsemide , however on presentation here, she shows her diuretic bottle of furosemide .  In the ER, found to have elevated BNP, chest x-ray with bilateral pleural effusions, admitted on IV Lasix .

## 2024-03-11 NOTE — Plan of Care (Signed)
 ?  Problem: Clinical Measurements: ?Goal: Ability to maintain clinical measurements within normal limits will improve ?Outcome: Progressing ?Goal: Will remain free from infection ?Outcome: Progressing ?Goal: Diagnostic test results will improve ?Outcome: Progressing ?  ?

## 2024-03-11 NOTE — Consult Note (Signed)
 @LOGO @   Referring Provider: [facility] Primary Care Physician:  Antonetta Rollene BRAVO, MD Primary Gastroenterologist:  Dr. Aneita  Date of Admission: 03/10/24 Date of Consultation: 03/11/24  Reason for Consultation:  Dysphagia  HPI:  Emily Jennings is a 89 y.o. year old female with history of diastolic CHF, HTN, CKD, diabetes, anemia without iron  deficiency, adenomatous colon polyps, who presented to the ED with complaint of swelling in her lower and upper extremities, abdomen, and shortness of breath.  Also noted difficulty swallowing over the last 3 to 4 weeks.  Notably, patient with recent prolonged hospitalization from 5/19 - 5/28 with decompensated CHF, AKI on CKD 4, hypervolemic hyponatremia, hypertensive urgency.  Discharged on torsemide  60 mg daily.  ED course: Hemodynamically stable. Labs remarkable for hemoglobin 8.5 (stable), sodium 127, creatinine 2.84, BUN 66, BNP 869. Chest x-ray with small bilateral pleural effusions with bibasilar atelectasis.  Cardiomegaly.  She was given IV Lasix  60 mg x 1 in the ED, admitted to the hospitalist service and continued on Lasix  40 mg twice daily.  GI consulted for reports of dysphagia.  Today:  Reports she came to the ER for worsening swelling since last admission, development of shortness of breath and a tightness in her esophagus with trouble swallowing for the last few weeks which she thinks is secondary to fluid build up. States fluid was accumulating all over her body.  Feels that this is getting better since admission.  Breathing also improving.  Reports she is almost back to baseline in regards to her respiratory status.  Regarding dysphagia, she described sensation of foods and liquids going down slowly. No issues with this prior to 3-4 weeks ago.  Denies nausea, vomiting, heartburn, reflux.  Reports her swallowing problems are actually getting better since she has been admitted.   Reports chronic intermittent diarrhea with a  couple bowel movements per day but sometimes skips days between bowel movements. May skip 2 days without a BM.  No BRBPR or melena.  Prior EGD: Never.  Last colonoscopy 09/03/2018: -6 sessile polyps were found, one in the descending colon, 20 transverse colon, 3 in the ascending colon.  Polyps were 6-8 mm in size.  Polyps resected and retrieved. -Multiple medium mouth diverticula in the left colon. - Pathology with tubular adenomas. - No recommendations to repeat due to age.    Past Medical History:  Diagnosis Date   Allergy    Anxiety    ANXIETY DISORDER, GENERALIZED 07/15/2007   Qualifier: Diagnosis of  By: Aisha Motes     Arthritis    Cancer [facility]) 2009   breast, carcinoma in situ left   Carcinoma in situ of breast 05/21/2008   Qualifier: Diagnosis of  By: Antonetta MD, Margaret  Diagnosed in 2009, completed 5 year course of tamoxifen, no evidence of recurrence    Carotid stenosis    11/16/2005  mild plaque formation and stenosis proximal right ECA   Cataract    Complication of anesthesia    Coronary artery disease    cardiac catheterization on 03/20/2006  LAD mid 40% stenosis, left circumflex mild 40% stenosis, RCA mid-vessel 40% to 50% lesion   EF 60%   Diabetes mellitus    GERD (gastroesophageal reflux disease)    Hernia, inguinal    left   Hyperglycemia    Hypertension    Insomnia 11/16/2011   Low blood potassium    Non-insulin  dependent type 2 diabetes mellitus (HCC)    Osteoporosis    Shortness of breath  2D Echocardiogram 01/26/2009   EF of greater than 55%, mild MR, mild TR, normal ventricular function   Thickened endometrium 10/26/2017   Noted by gyne in 2017, missed 6 month follow up, referred in 09/2017   Ventricular tachycardia, non-sustained (HCC)    developed during stress test 02/08/2006, spontaneously aborted, mild reversible apical defect    Past Surgical History:  Procedure Laterality Date   BOWEL RESECTION Left 07/19/2023   Procedure: SMALL BOWEL  RESECTION;  Surgeon: Kallie Manuelita BROCKS, MD;  Location: [facility];  Service: General;  Laterality: Left;   BREAST LUMPECTOMY Left 2009   Left breast 2009   CATARACT EXTRACTION W/PHACO Left 10/28/2014   Procedure: PHACO EMULSION CATARACT EXTRACTION WITH INTRAOCULAR LENS IMPLANT LEFT EYE (IOC);  Surgeon: Gaither Quan, MD;  Location: [facility] OR;  Service: Ophthalmology;  Laterality: Left;   COLONOSCOPY     cyst removed from left foot     INGUINAL HERNIA REPAIR Left 07/19/2023   Procedure: HERNIA REPAIR FEMORAL INCARCERATED;  Surgeon: Kallie Manuelita BROCKS, MD;  Location: [facility];  Service: General;  Laterality: Left;   REFRACTIVE SURGERY Left     Prior to Admission medications   Medication Sig Start Date End Date Taking? Authorizing Provider  albuterol  (VENTOLIN  HFA) 108 (90 Base) MCG/ACT inhaler Inhale 2 puffs into the lungs every 2 (two) hours as needed for wheezing or shortness of breath. 02/13/24  Yes Emokpae, Courage, MD  amLODipine  (NORVASC ) 10 MG tablet Take 1 tablet (10 mg total) by mouth daily. 02/13/24  Yes Pearlean Manus, MD  carvedilol  (COREG ) 12.5 MG tablet Take 1 tablet (12.5 mg total) by mouth 2 (two) times daily with a meal. 02/13/24  Yes Emokpae, Courage, MD  cloNIDine  (CATAPRES ) 0.3 MG tablet Take 1 tablet (0.3 mg total) by mouth 2 (two) times daily. 02/13/24  Yes Emokpae, Courage, MD  furosemide  (LASIX ) 40 MG tablet Take 40 mg by mouth 2 (two) times daily.   Yes [provider]  hydrALAZINE  (APRESOLINE ) 100 MG tablet Take 1 tablet (100 mg total) by mouth 3 (three) times daily. 02/13/24  Yes Pearlean Manus, MD  insulin  degludec (TRESIBA ) 200 UNIT/ML FlexTouch Pen Inject 6 Units into the skin daily at 10 pm. 02/13/24  Yes Emokpae, Courage, MD  insulin  lispro (HUMALOG  KWIKPEN) 100 UNIT/ML KwikPen Inject 0-12 Units into the skin 3 (three) times daily. Short acting Humalog  insulin  per sliding scale 0-12 ---:-- Insulin  injection 0-12 Units 0-12 Units Subcutaneous, 3 times daily with meals  CBG 70 - 120: 0 unit CBG 121 - 150: 0 unit  CBG 151 - 200: 2 unit CBG 201 - 250: 4 units CBG 251 - 300: 6 units CBG 301 - 350: 8 units  CBG 351 - 400: 10 units  CBG > 400: 12 units 02/13/24  Yes Emokpae, Courage, MD  isosorbide  mononitrate (IMDUR ) 60 MG 24 hr tablet Take 1 tablet (60 mg total) by mouth daily. 02/13/24  Yes Emokpae, Courage, MD  magnesium  oxide (MAG-OX) 400 (240 Mg) MG tablet Take 1 tablet (400 mg total) by mouth daily. 02/13/24  Yes Pearlean Manus, MD  Potassium Chloride  ER 20 MEQ TBCR Take 1 tablet by mouth daily. 02/10/24  Yes [provider]  RESTASIS  0.05 % ophthalmic emulsion Place 1 drop into both eyes 2 (two) times daily. 11/01/23  Yes [provider]  rosuvastatin  (CRESTOR ) 5 MG tablet Take 1 tablet (5 mg total) by mouth daily. 02/13/24  Yes Emokpae, Courage, MD  torsemide  (DEMADEX ) 20 MG tablet Take 3 tablets (  60 mg total) by mouth daily. 02/13/24  Yes Pearlean Manus, MD    Current Facility-Administered Medications  Medication Dose Route Frequency Provider Last Rate Last Admin   acetaminophen  (TYLENOL ) tablet 650 mg  650 mg Oral Q6H PRN Emokpae, Ejiroghene E, MD       Or   acetaminophen  (TYLENOL ) suppository 650 mg  650 mg Rectal Q6H PRN Emokpae, Ejiroghene E, MD       albuterol  (PROVENTIL ) (2.5 MG/3ML) 0.083% nebulizer solution 2.5 mg  2.5 mg Inhalation Q2H PRN Emokpae, Ejiroghene E, MD       amLODipine  (NORVASC ) tablet 10 mg  10 mg Oral Daily Emokpae, Ejiroghene E, MD   10 mg at 03/11/24 9096   carvedilol  (COREG ) tablet 12.5 mg  12.5 mg Oral BID WC Emokpae, Ejiroghene E, MD   12.5 mg at 03/11/24 9096   cloNIDine  (CATAPRES ) tablet 0.3 mg  0.3 mg Oral BID Emokpae, Ejiroghene E, MD   0.3 mg at 03/11/24 9096   cycloSPORINE  (RESTASIS ) 0.05 % ophthalmic emulsion 1 drop  1 drop Both Eyes BID Emokpae, Ejiroghene E, MD   1 drop at 03/11/24 9095   furosemide  (LASIX ) injection 40 mg  40 mg Intravenous BID Emokpae, Ejiroghene E, MD   40 mg at 03/11/24 0904   heparin   injection 5,000 Units  5,000 Units Subcutaneous Q8H Emokpae, Ejiroghene E, MD   5,000 Units at 03/11/24 9358   insulin  aspart (novoLOG ) injection 0-5 Units  0-5 Units Subcutaneous QHS Emokpae, Ejiroghene E, MD   2 Units at 03/10/24 2123   insulin  aspart (novoLOG ) injection 0-9 Units  0-9 Units Subcutaneous TID WC Emokpae, Ejiroghene E, MD       insulin  glargine-yfgn (SEMGLEE ) injection 6 Units  6 Units Subcutaneous Q2200 Emokpae, Ejiroghene E, MD   6 Units at 03/10/24 2147   ondansetron  (ZOFRAN ) tablet 4 mg  4 mg Oral Q6H PRN Emokpae, Ejiroghene E, MD       Or   ondansetron  (ZOFRAN ) injection 4 mg  4 mg Intravenous Q6H PRN Emokpae, Ejiroghene E, MD       polyethylene glycol (MIRALAX  / GLYCOLAX ) packet 17 g  17 g Oral Daily PRN Emokpae, Ejiroghene E, MD        Allergies as of 03/10/2024 - Review Complete 03/10/2024  Allergen Reaction Noted   Farxiga [dapagliflozin] Other (See Comments) 05/01/2023   Metformin  and related Diarrhea 10/12/2013   Benadryl [diphenhydramine hcl] Hypertension 11/12/2012   Citalopram Other (See Comments) 11/02/2008   Tramadol  Other (See Comments) 09/15/2015   Amlodipine  Swelling 04/24/2023   Crestor  [rosuvastatin ] Swelling and Other (See Comments) 06/16/2021    Family History  Problem Relation Age of Onset   Hypertension Mother    Hyperlipidemia Mother    Stroke Mother    Urticaria Mother    Cancer Father        pancreatic   Colon cancer Father    Heart disease Brother 40       bypass   Heart disease Brother 4       bypass   Arthritis Other    Asthma Other    Diabetes Other    Colon cancer Paternal Aunt    Esophageal cancer Neg Hx    Stomach cancer Neg Hx    Rectal cancer Neg Hx     Social History   Socioeconomic History   Marital status: Married    Spouse name: Not on file   Number of children: 2   Years of education: na  Highest education level: Some college, no degree  Occupational History   Occupation: retired    Associate Professor: retired   Tobacco Use   Smoking status: Never    Passive exposure: Never   Smokeless tobacco: Never  Vaping Use   Vaping status: Never Used  Substance and Sexual Activity   Alcohol use: No   Drug use: No   Sexual activity: Not Currently    Birth control/protection: Post-menopausal  Other Topics Concern   Not on file  Social History Narrative   Lives with her husband and son and attends yoga at the [employer] 2 days a week and speaks to her grandson nightly on the phone   Social Drivers of Health   Financial Resource Strain: Low Risk  (12/28/2022)   Overall Financial Resource Strain (CARDIA)    Difficulty of Paying Living Expenses: Not hard at all  Food Insecurity: No Food Insecurity (03/10/2024)   Hunger Vital Sign    Worried About Running Out of Food in the Last Year: Never true    Ran Out of Food in the Last Year: Never true  Transportation Needs: No Transportation Needs (03/10/2024)   PRAPARE - Administrator, Civil Service (Medical): No    Lack of Transportation (Non-Medical): No  Physical Activity: Sufficiently Active (11/15/2022)   Exercise Vital Sign    Days of Exercise per Week: 5 days    Minutes of Exercise per Session: 30 min  Stress: No Stress Concern Present (12/28/2022)   [employer] of Occupational Health - Occupational Stress Questionnaire    Feeling of Stress : Only a little  Social Connections: Socially Integrated (03/10/2024)   Social Connection and Isolation Panel    Frequency of Communication with Friends and Family: Three times a week    Frequency of Social Gatherings with Friends and Family: Three times a week    Attends Religious Services: More than 4 times per year    Active Member of Clubs or Organizations: Yes    Attends Banker Meetings: 1 to 4 times per year    Marital Status: Married  Catering manager Violence: Not At Risk (03/10/2024)   Humiliation, Afraid, Rape, and Kick questionnaire    Fear of Current or Ex-Partner: No     Emotionally Abused: No    Physically Abused: No    Sexually Abused: No    Review of Systems: Gen: Denies fever, chills, cold or flulike symptoms, presyncope, syncope. CV: Denies chest pain, heart palpitations. Resp: Admits to mild shortness of breath.  No cough. GI: See HPI GU : Denies urinary burning, urinary frequency, urinary incontinence.  MS: Denies joint pain. Derm: Denies rash. Psych: Denies depression, anxiety. Heme: See HPI  Physical Exam: Vital signs in last 24 hours: Temp:  [97.1 F (36.2 C)-98.8 F (37.1 C)] 97.5 F (36.4 C) (06/24 0902) Pulse Rate:  [54-69] 64 (06/24 0902) Resp:  [10-18] 18 (06/24 0329) BP: (132-165)/(62-93) 165/72 (06/24 0902) SpO2:  [95 %-100 %] 100 % (06/24 0902) Weight:  [57.4 kg-59 kg] 57.4 kg (06/24 0500) Last BM Date : 03/10/24 General:   Alert,  Well-developed, well-nourished, pleasant and cooperative in NAD Head:  Normocephalic and atraumatic. Eyes:  Sclera clear, no icterus.   Conjunctiva pink. Ears: Decreased auditory acuity. Lungs:  Clear throughout to auscultation.   No wheezes, crackles, or rhonchi. No acute distress, but speaking in broken sentences. Heart:  Regular rate and rhythm.  Systolic murmur appreciated. Abdomen:  Soft, nontender and nondistended. No masses, hepatosplenomegaly or  hernias noted. Normal bowel sounds, without guarding, and without rebound.  Pitting edema in the flanks. Rectal:  Deferred .   Msk:  Symmetrical without gross deformities. Normal posture. Extremities:  With pitting edema up to the hips. Neurologic:  Alert and  oriented x3.  Unable to give the appropriate year, but oriented to person, place, situation. Skin:  Intact without significant lesions or rashes. Psych:  Normal mood and affect.  Intake/Output from previous day: 06/23 0701 - 06/24 0700 In: -  Out: 500 [Urine:500] Intake/Output this shift: Total I/O In: -  Out: 500 [Urine:500]  Lab Results: Recent Labs    03/10/24 1447  WBC 3.2*   HGB 8.5*  HCT 25.3*  PLT 162   BMET Recent Labs    03/10/24 1447 03/11/24 0409  NA 127* 128*  K 3.5 3.4*  CL 87* 90*  CO2 29 28  GLUCOSE 172* 174*  BUN 66* 62*  CREATININE 2.84* 2.73*  CALCIUM  8.6* 8.2*   LFT Recent Labs    03/10/24 1447  PROT 5.0*  ALBUMIN  2.9*  AST 23  ALT 11  ALKPHOS 63  BILITOT 0.5    Studies/Results: DG Chest 2 View Result Date: 03/10/2024 CLINICAL DATA:  Shortness of breath, leg swelling EXAM: CHEST - 2 VIEW COMPARISON:  02/04/2024 FINDINGS: Small bilateral pleural effusions. Mild cardiomegaly. Aortic atherosclerosis. Minimal bibasilar opacities, likely atelectasis. No overt edema. No acute bony abnormality. IMPRESSION: Cardiomegaly. Small bilateral pleural effusions with bibasilar atelectasis. Electronically Signed   By: Franky Crease M.D.   On: 03/10/2024 15:12    Impression: 89 y.o. year old female with history of diastolic CHF, HTN, CKD, diabetes, anemia without iron  deficiency, adenomatous colon polyps, who presented to the ED with worsening generalized edema, SOB, and complaint of dysphagia.  GI consulted for dysphagia.  Dysphagia: New onset symptom of foods going down her esophagus slowly for the last 3 to 4 weeks.  Patient felt this was secondary to generalized edema, and feels that symptoms are improving since being admitted and receiving IV diuretics.  No prior EGD.  No chronic GERD, nausea, vomiting.  We discussed possibility of EGD to evaluate her symptoms, but she prefers to avoid this if possible.  Will arrange barium pill esophagram for now.  If need for EGD is identified on BPE, this would need to be arranged once improved from CHF standpoint.  Acute on chronic CHF: Management per hospitalist.  Received IV Lasix  60 mg x 1 in the ED and now on IV Lasix  40 mg twice daily. Reports some improvement in respiratory status and feels she is close to baseline, but she is speaking in broken sentences today to take a breath. Remains volume  overloaded on exam.   Plan: Barium Pill Esophagram .  Can continue regular diet for now as she feels she is tolerating this ok.  If need for EGD identified on BPE, EGD will need to be arranged once improved from CHF standpoint. CHF management per hospitalist.    LOS: 1 day    03/11/2024, 9:16 AM   Josette Centers, PA-C [facility]

## 2024-03-11 NOTE — TOC Initial Note (Signed)
 Transition of Care [facility]) - Initial/Assessment Note    Patient Details  Name: Emily Jennings MRN: [id number] Date of Birth: 1934-04-21  Transition of Care [facility]) CM/SW Contact:    Lucie Lunger, LCSWA Phone Number: 03/11/2024, 8:31 AM  Clinical Narrative:                 Pt is high risk for readmission and well known to [facility] from past hospital admission. Pt lives with her spouse and son and they are able to assist with ADLs when needed. Pts spouse provides transportation for pt. Pt has a cane to use in the home. Pt has used [facility] services. TOC to follow.   Expected Discharge Plan: Home w Home Health Services Barriers to Discharge: Continued Medical Work up   Patient Goals and CMS Choice Patient states their goals for this hospitalization and ongoing recovery are:: return home CMS Medicare.gov Compare Post Acute Care list provided to:: Patient Choice offered to / list presented to : Patient      Expected Discharge Plan and Services In-house Referral: Clinical Social Work Discharge Planning Services: CM Consult Post Acute Care Choice: Home Health Living arrangements for the past 2 months: Single Family Home                                      Prior Living Arrangements/Services Living arrangements for the past 2 months: Single Family Home Lives with:: Spouse Patient language and need for interpreter reviewed:: Yes Do you feel safe going back to the place where you live?: Yes      Need for Family Participation in Patient Care: Yes (Comment) Care giver support system in place?: Yes (comment) Current home services: DME Criminal Activity/Legal Involvement Pertinent to Current Situation/Hospitalization: No - Comment as needed  Activities of Daily Living   ADL Screening (condition at time of admission) Independently performs ADLs?: No Does the patient have a NEW difficulty with bathing/dressing/toileting/self-feeding that is expected to last >3 days?: No Does the  patient have a NEW difficulty with getting in/out of bed, walking, or climbing stairs that is expected to last >3 days?: No Does the patient have a NEW difficulty with communication that is expected to last >3 days?: No Is the patient deaf or have difficulty hearing?: Yes Does the patient have difficulty seeing, even when wearing glasses/contacts?: No Does the patient have difficulty concentrating, remembering, or making decisions?: No  Permission Sought/Granted                  Emotional Assessment Appearance:: Appears stated age Attitude/Demeanor/Rapport: Engaged Affect (typically observed): Accepting Orientation: : Oriented to Self, Oriented to Place, Oriented to  Time, Oriented to Situation Alcohol / Substance Use: Not Applicable Psych Involvement: No (comment)  Admission diagnosis:  Hyponatremia [E87.1] Bilateral pleural effusion [J90] Acute on chronic diastolic (congestive) heart failure (HCC) [I50.33] Chronic kidney disease, unspecified CKD stage [N18.9] Acute congestive heart failure, unspecified heart failure type (HCC) [I50.9] Acute congestive heart failure (HCC) [I50.9] Patient Active Problem List   Diagnosis Date Noted   Anemia due to chronic kidney disease 02/04/2024   Type 2 diabetes mellitus with hyperglycemia (HCC) 01/22/2024   Insulin  long-term use (HCC) 12/05/2023   Type 2 diabetes mellitus with other specified complication (HCC) 09/25/2023   Acute kidney injury superimposed on stage 4 chronic kidney disease (HCC) 08/14/2023   Small bowel obstruction (HCC) 07/19/2023   Ischemic  bowel disease (HCC) 07/19/2023   Incarcerated left inguinal hernia 07/18/2023   Hypoglycemia 07/18/2023   Uncontrolled type 2 diabetes mellitus with hypoglycemia, with long-term current use of insulin  (HCC) 07/04/2023   Dysphagia 07/04/2023   HCAP (healthcare-associated pneumonia) 07/03/2023   Essential hypertension 06/15/2023   Cerebellar edema (HCC) 05/23/2023   Mixed  hyperlipidemia 05/23/2023   DNR (do not resuscitate) 05/17/2023   Vocal cord dysfunction 05/17/2023   DNR (do not resuscitate) discussion 05/17/2023   Goals of care, counseling/discussion 05/17/2023   Right cerebellar intraparenchymal hemorrhage due to hypertension 05/16/2023   Unsteady gait when walking 04/03/2023   Hospital discharge follow-up 03/06/2023   Bilateral kidney stones 03/06/2023   Hip swelling, right 02/27/2023   DKA (diabetic ketoacidosis) (HCC) 02/17/2023   High anion gap metabolic acidosis 02/17/2023   Pseudohyponatremia 02/17/2023   Hypoalbuminemia due to protein-calorie malnutrition (HCC) 02/17/2023   Dehydration 02/17/2023   Hip pain 02/15/2023   Encounter for Medicare annual examination with abnormal findings 10/16/2022   Vitamin D  deficiency 10/16/2022   Acute on chronic congestive heart failure with left ventricular diastolic dysfunction (HCC) 09/13/2022   Chronic kidney disease (CKD) stage G3b/A2, moderately decreased glomerular filtration rate (GFR) between 30-44 mL/min/1.73 square meter and albuminuria creatinine ratio between 30-299 mg/g (HCC) 09/13/2022   Mild aortic valve stenosis 09/13/2022   Dyspnea on exertion 09/05/2022   CHF (congestive heart failure) (HCC) 09/05/2022   Hyperkalemia 04/09/2022   Hyperglycemic crisis due to diabetes mellitus (HCC) 03/25/2022   Hyponatremia 03/25/2022   Acute metabolic encephalopathy 03/22/2022   Hypothermia 03/22/2022   Heart murmur 03/22/2022   Bilateral lower extremity edema 03/22/2022   Syncope and collapse 01/13/2021   Lip swelling 10/30/2020   CKD stage 4 due to type 2 diabetes mellitus (HCC) 10/30/2020   Tubular adenoma of colon 10/26/2017   Diabetes (HCC) 07/27/2016   Osteoporosis 07/28/2014   Diabetic hypertension-nephrosis syndrome (HCC) 03/23/2014   Type 2 diabetes mellitus with stage 3a chronic kidney disease, with long-term current use of insulin  (HCC) 09/29/2013   Allergic rhinitis 01/27/2013   GERD  (gastroesophageal reflux disease) 01/12/2013   CVD (cardiovascular disease) 07/17/2011   Hypokalemia 08/31/2008   Hyperlipidemia LDL goal <100 07/15/2007   Resistant hypertension 07/15/2007   PCP:  Antonetta Rollene BRAVO, MD Pharmacy:   [id number] [place], [place] - [address] AT [facility] [address] [place] [address] Phone: [phone] Fax: [phone]  [facility]) - [place], [place] - [address] [facility] [address] [place] [address] [place] [address] Phone: [phone] Fax: [phone]  [facility] [address] [place] [address] Phone: [phone] Fax: [phone]     Social Drivers of Health (SDOH) Social History: SDOH Screenings   Food Insecurity: No Food Insecurity (03/10/2024)  Housing: Low Risk  (03/10/2024)  Transportation Needs: No Transportation Needs (03/10/2024)  Utilities: Not At Risk (03/10/2024)  Alcohol Screen: Low Risk  (11/15/2022)  Depression (PHQ2-9): Low Risk  (10/24/2023)  Financial Resource Strain: Low Risk  (12/28/2022)  Physical Activity: Sufficiently Active (11/15/2022)  Social Connections: Socially Integrated (03/10/2024)  Stress: No Stress Concern Present (12/28/2022)  Tobacco Use: Low Risk  (03/10/2024)   SDOH Interventions:     Readmission Risk Interventions    03/11/2024    8:30 AM 02/05/2024   12:02 PM 02/01/2024    9:55 AM  Readmission Risk Prevention Plan  Transportation Screening Complete Complete Complete  HRI or Home Care Consult  Complete  Social Work Consult for Recovery Care Planning/Counseling   Complete  Palliative Care Screening   Complete  Medication Review Oceanographer) Complete Complete Complete  PCP or Specialist appointment within 3-5 days of discharge  Not Complete   HRI or Home Care Consult Complete Complete   SW Recovery Care/Counseling Consult Complete Complete   Palliative Care Screening Not Applicable Not  Applicable   Skilled Nursing Facility Complete Complete

## 2024-03-11 NOTE — Progress Notes (Signed)
 Nurse at bedside,patient alert to person,place and situation,confused to time.No c/o pain or discomfort noted. Patient out of  bed to chair this morning.Very pleasant.Family at bedside. Plan of care on going.

## 2024-03-11 NOTE — Evaluation (Signed)
 Physical Therapy Evaluation Patient Details Name: YEILA MORRO MRN: [id number] DOB: 07/20/1934 Today's Date: 03/11/2024  History of Present Illness  Emily Jennings is a 89 y.o. female with medical history significant for diastolic CHF, hypertension, CKD, diabetes mellitus.  Patient presented to the ED with complaints of lower extremity swelling and difficulty breathing.  Patient has chronic swelling to her lower extremities, but she reports over the past 2 to 3 weeks, swelling has significantly worsened, she also reports swelling to her abdomen and arms.  She reports difficulty breathing over the past 3 to 4 days.  No chest pain.  Reports mild cough.  She reports 20 pound weight gain over the past few weeks.  She reports compliance with her medication Lasix  40 twice daily-she shows me the pill bottle.  She also reports difficulty swallowing over the past 3 to 4 weeks, she denies pain with swallowing, reports is harder for food to go down her throat.  No coughing or choking with meals.   Clinical Impression  Patient agreeable to and tolerates PT evaluation well. Patient received sitting in recliner. Mod. Independent/supervision for STS, CGA/supervision during ambulation due to mild balance impairments observed and no SOB or dyspnea of note and mod independent with bed mobility. Patient reports having good support at home from family and necessary equipment. Patient does not present with urgent need for skilled physical therapy at this time but may benefit from PT in Home health setting to reduce risk of falls presented from mild unsteadiness. Patient discharged to care of nursing for ambulation daily as tolerated for length of stay.      If plan is discharge home, recommend the following: A little help with walking and/or transfers;A little help with bathing/dressing/bathroom;Assist for transportation;Assistance with cooking/housework;Help with stairs or ramp for entrance   Can travel by private  vehicle        Equipment Recommendations None recommended by PT  Recommendations for Other Services       Functional Status Assessment Patient has had a recent decline in their functional status and demonstrates the ability to make significant improvements in function in a reasonable and predictable amount of time.     Precautions / Restrictions Precautions Precautions: Fall Recall of Precautions/Restrictions: Intact Restrictions Weight Bearing Restrictions Per Provider Order: No      Mobility  Bed Mobility Overal bed mobility: Needs Assistance Bed Mobility: Sit to Supine       Sit to supine: Modified independent (Device/Increase time)   General bed mobility comments: Pt received in recliner. Back to bed at end of session, mod Independ for sit to supine with HOB slightly elevated and inc time needed, no physical assist    Transfers Overall transfer level: Needs assistance Equipment used: Rolling walker (2 wheels) Transfers: Sit to/from Stand, Bed to chair/wheelchair/BSC Sit to Stand: Supervision, Contact guard assist   Step pivot transfers: Contact guard assist, Supervision       General transfer comment: Slow movement, CGA for safety    Ambulation/Gait Ambulation/Gait assistance: Contact guard assist, Supervision Gait Distance (Feet): 40 Feet Assistive device: Rolling walker (2 wheels) Gait Pattern/deviations: Step-through pattern, Decreased step length - left, Decreased step length - right, Trunk flexed Gait velocity: Dec     General Gait Details: Slow labored movement at time, slow turns with multiple steps to complete, CGA/Sup for safety due to mild unsteadiness.  Stairs    Wheelchair Mobility     Tilt Bed    Modified Rankin (Stroke Patients Only)  Balance Overall balance assessment: Needs assistance Sitting-balance support: Feet supported, No upper extremity supported, Single extremity supported Sitting balance-Leahy Scale:  Good Sitting balance - Comments: good seated Edge of recliner during MMT   Standing balance support: During functional activity, Bilateral upper extremity supported Standing balance-Leahy Scale: Fair Standing balance comment: Fair during ambulation with RW           Pertinent Vitals/Pain Pain Assessment Pain Assessment: No/denies pain    Home Living Family/patient expects to be discharged to:: Private residence Living Arrangements: Spouse/significant other;Children Available Help at Discharge: Family;Available 24 hours/day Type of Home: House Home Access: Stairs to enter Entrance Stairs-Rails: None Entrance Stairs-Number of Steps: 1   Home Layout: One level Home Equipment: Cane - single point;Shower Counsellor (2 wheels) Additional Comments: Reports no change in home set up/support since last admission    Prior Function Prior Level of Function : Independent/Modified Independent       Mobility Comments: household ambulation using SPC or RW, reports son or husband provides CGA ADLs Comments: Reports independence mostly but help available     Extremity/Trunk Assessment   Upper Extremity Assessment Upper Extremity Assessment: Defer to OT evaluation    Lower Extremity Assessment Lower Extremity Assessment: Generalized weakness;Overall [facility] for tasks assessed (Ankle DF 4/5 bilat, hip flexion 4-/5 bilat. Generally weak but adequate for functional tasks)    Cervical / Trunk Assessment Cervical / Trunk Assessment: Kyphotic  Communication   Communication Communication: No apparent difficulties Factors Affecting Communication: Other (comment) (Pt becomes slightly frustrated at end of session due to difficulty with word finding. Reports since CVA shes had that issue)    Cognition Arousal: Alert Behavior During Therapy: WFL for tasks assessed/performed   PT - Cognitive impairments: No apparent impairments           Following commands: Intact       Cueing  Cueing Techniques: Verbal cues     General Comments      Exercises     Assessment/Plan    PT Assessment All further PT needs can be met in the next venue of care  PT Problem List Decreased strength;Decreased activity tolerance;Decreased balance;Decreased range of motion       PT Treatment Interventions      PT Goals (Current goals can be found in the Care Plan section)  Acute Rehab PT Goals Patient Stated Goal: return home with HHPT services as needed PT Goal Formulation: With patient Time For Goal Achievement: 03/14/24 Potential to Achieve Goals: Good    Frequency       Co-evaluation               AM-PAC PT 6 Clicks Mobility  Outcome Measure Help needed turning from your back to your side while in a flat bed without using bedrails?: A Little Help needed moving from lying on your back to sitting on the side of a flat bed without using bedrails?: A Little Help needed moving to and from a bed to a chair (including a wheelchair)?: A Little Help needed standing up from a chair using your arms (e.g., wheelchair or bedside chair)?: A Little Help needed to walk in hospital room?: A Little Help needed climbing 3-5 steps with a railing? : A Lot 6 Click Score: 17    End of Session   Activity Tolerance: Patient tolerated treatment well Patient left: in bed;with call bell/phone within reach;with bed alarm set   PT Visit Diagnosis: Unsteadiness on feet (R26.81);Other abnormalities of gait and mobility (R26.89);Muscle  weakness (generalized) (M62.81)    Time: 8680-8664 PT Time Calculation (min) (ACUTE ONLY): 16 min   Charges:   PT Evaluation $PT Eval Low Complexity: 1 Low PT Treatments $Therapeutic Activity: 8-22 mins PT General Charges $$ ACUTE PT VISIT: 1 Visit        3:31 PM, 03/11/24 Harjit Douds Powell-Butler, PT, DPT  with [facility]

## 2024-03-12 ENCOUNTER — Inpatient Hospital Stay (HOSPITAL_COMMUNITY)

## 2024-03-12 DIAGNOSIS — I5033 Acute on chronic diastolic (congestive) heart failure: Secondary | ICD-10-CM | POA: Diagnosis not present

## 2024-03-12 LAB — CBC
HCT: 23.7 % — ABNORMAL LOW (ref 36.0–46.0)
Hemoglobin: 7.9 g/dL — ABNORMAL LOW (ref 12.0–15.0)
MCH: 30.5 pg (ref 26.0–34.0)
MCHC: 33.3 g/dL (ref 30.0–36.0)
MCV: 91.5 fL (ref 80.0–100.0)
Platelets: 142 10*3/uL — ABNORMAL LOW (ref 150–400)
RBC: 2.59 MIL/uL — ABNORMAL LOW (ref 3.87–5.11)
RDW: 13.5 % (ref 11.5–15.5)
WBC: 3.5 10*3/uL — ABNORMAL LOW (ref 4.0–10.5)
nRBC: 0 % (ref 0.0–0.2)

## 2024-03-12 LAB — BASIC METABOLIC PANEL WITH GFR
Anion gap: 9 (ref 5–15)
BUN: 67 mg/dL — ABNORMAL HIGH (ref 8–23)
CO2: 29 mmol/L (ref 22–32)
Calcium: 8.4 mg/dL — ABNORMAL LOW (ref 8.9–10.3)
Chloride: 92 mmol/L — ABNORMAL LOW (ref 98–111)
Creatinine, Ser: 2.79 mg/dL — ABNORMAL HIGH (ref 0.44–1.00)
GFR, Estimated: 16 mL/min — ABNORMAL LOW (ref >60)
Glucose, Bld: 119 mg/dL — ABNORMAL HIGH (ref 70–99)
Potassium: 3.8 mmol/L (ref 3.5–5.1)
Sodium: 130 mmol/L — ABNORMAL LOW (ref 135–145)

## 2024-03-12 LAB — GLUCOSE, CAPILLARY
Glucose-Capillary: 95 mg/dL (ref 70–99)
Glucose-Capillary: 225 mg/dL — ABNORMAL HIGH (ref 70–99)
Glucose-Capillary: 283 mg/dL — ABNORMAL HIGH (ref 70–99)

## 2024-03-12 NOTE — TOC Transition Note (Signed)
 Transition of Care [facility]) - Discharge Note   Patient Details  Name: Emily Jennings MRN: [id number] Date of Birth: 09-Dec-1933  Transition of Care [facility]) CM/SW Contact:  Mcarthur Saddie Kim, LCSW Phone Number: 03/12/2024, 2:53 PM   Clinical Narrative:  Pt d/c today. Cory with [facility] notified of d/c. HHPT order in.      Final next level of care: Home w Home Health Services Barriers to Discharge: Barriers Resolved   Patient Goals and CMS Choice Patient states their goals for this hospitalization and ongoing recovery are:: return home CMS Medicare.gov Compare Post Acute Care list provided to:: Patient Choice offered to / list presented to : Patient      Discharge Placement                       Discharge Plan and Services Additional resources added to the After Visit Summary for   In-house Referral: Clinical Social Work Discharge Planning Services: CM Consult Post Acute Care Choice: Home Health                    HH Arranged: PT [facility] Agency: [facility] Date [facility] Agency Contacted: 03/12/24 Time HH Agency Contacted: [phone] Representative spoke with at [facility] Agency: Darleene  Social Drivers of Health (SDOH) Interventions SDOH Screenings   Food Insecurity: No Food Insecurity (03/10/2024)  Housing: Low Risk  (03/10/2024)  Transportation Needs: No Transportation Needs (03/10/2024)  Utilities: Not At Risk (03/10/2024)  Alcohol Screen: Low Risk  (11/15/2022)  Depression (PHQ2-9): Low Risk  (10/24/2023)  Financial Resource Strain: Low Risk  (12/28/2022)  Physical Activity: Sufficiently Active (11/15/2022)  Social Connections: Socially Integrated (03/10/2024)  Stress: No Stress Concern Present (12/28/2022)  Tobacco Use: Low Risk  (03/10/2024)     Readmission Risk Interventions    03/11/2024    8:30 AM 02/05/2024   12:02 PM 02/01/2024    9:55 AM  Readmission Risk Prevention Plan  Transportation Screening Complete Complete Complete  HRI or Home Care Consult   Complete   Social Work Consult for Recovery Care Planning/Counseling   Complete  Palliative Care Screening   Complete  Medication Review Oceanographer) Complete Complete Complete  PCP or Specialist appointment within 3-5 days of discharge  Not Complete   HRI or Home Care Consult Complete Complete   SW Recovery Care/Counseling Consult Complete Complete   Palliative Care Screening Not Applicable Not Applicable   Skilled Nursing Facility Complete Complete

## 2024-03-12 NOTE — Progress Notes (Signed)
 Briefly, patient is an 89 year old with history of diastolic CHF, HTN, CKD, diabetes, who came to the hospital after presenting worsening SOB due to acute on chronic heart failure, GI was consulted for evaluation of possible dysphagia.  Patient reported progressive fatigue, swelling to LEs, and feelings of some altered swallowing due to too much fluid in her body, she denied overt dysphagia but that foods/liquids passing slower, not causing significant issues, did not wish to undergo any procedures unless absolutely necessary  Recommended to have barium esophagram which she underwent this morning with normal passage of barium tablet, some vestibular penetration without aspiration, moderate esophageal dysmotility noted, no reflux seen, no hiatal hernia.   Will hold off on EGD at this time, could discuss further in outpatient setting if she continues to have altered swallowing, may benefit from SLP evaluation given vestibular penetration though reassuringly no aspiration noted.   Of note, patient was not seen today. GI will continue to follow peripherally for now.  Shaundrea Carrigg L. Andree Golphin, MSN, APRN, AGNP-C Adult-Gerontology Nurse Practitioner [facility] at [facility]

## 2024-03-12 NOTE — Discharge Summary (Signed)
 Physician Discharge Summary  Emily Jennings FMW:[id number] DOB: February 21, 1934 DOA: 03/10/2024  PCP: Antonetta Rollene BRAVO, MD  Admit date: 03/10/2024  Discharge date: 03/12/2024  Admitted From:Home  Disposition:  Home  Recommendations for Outpatient Follow-up:  Follow up with PCP in 1-2 weeks Follow-up with GI outpatient if swallowing issues persist Continue on home medications as prior and it has been reinforced that she needs to remain on torsemide  60 mg daily instead of Lasix  Continue to monitor weight at home Remains high risk for readmission given history of dementia  Home Health: Yes with PT  Equipment/Devices: None  Discharge Condition:Stable  CODE STATUS: DNR  Diet recommendation: Heart Healthy/carb modified with fluid restriction  Brief/Interim Summary: 89 y.o. F with dCHF, DM, CKD IV baseline 2.8 who presented with progressive lower extremity swelling and shortness of breath with exertion for several days as well as 20 pound weight gain.  She was also noted to have trouble with swallowing.   During her recent admission, she was discharged on torsemide , however on presentation here, she shows her diuretic bottle of furosemide .  Patient was admitted with acute on chronic diastolic CHF exacerbation and was started on diuresis with IV Lasix .  She was noted to have some associated hypervolemic hyponatremia which has been improving with diuresis as well.  She is now euvolemic and Reds clip scoring is at 23%.  She was noted to have trouble with dysphagia and underwent barium swallow evaluation with no significant findings noted and was seen by GI.  She is tolerating her diet without any concerns.  She has been seen by PT with recommendations for home health services.  It has been reinforced that she will need to take torsemide  as opposed to furosemide  and she voices understanding, but remains at high risk for readmission.  Discharge Diagnoses:  Principal Problem:   Acute on chronic  congestive heart failure with left ventricular diastolic dysfunction (HCC) Active Problems:   Hyponatremia   CKD stage 4 due to type 2 diabetes mellitus (HCC)   DNR (do not resuscitate) discussion   Essential hypertension   Type 2 diabetes mellitus with other specified complication (HCC)  Principal discharge diagnosis: Acute on chronic diastolic CHF exacerbation in the setting of poor medication compliance.  Dysphagia with no significant abnormalities noted on barium swallow.  Discharge Instructions  Discharge Instructions     (HEART FAILURE PATIENTS) Call MD:  Anytime you have any of the following symptoms: 1) 3 pound weight gain in 24 hours or 5 pounds in 1 week 2) shortness of breath, with or without a dry hacking cough 3) swelling in the hands, feet or stomach 4) if you have to sleep on extra pillows at night in order to breathe.   Complete by: As directed    Diet - low sodium heart healthy   Complete by: As directed    Increase activity slowly   Complete by: As directed       Allergies as of 03/12/2024       Reactions   Farxiga [dapagliflozin] Other (See Comments)   DKA   Metformin  And Related Diarrhea   Lost appetite and weight    Benadryl [diphenhydramine Hcl] Hypertension   Citalopram Other (See Comments)   Unknown    Tramadol  Other (See Comments)   Felt light headed and dizzy   Amlodipine  Swelling   Crestor  [rosuvastatin ] Swelling, Other (See Comments)   Feet swelling Makes her feel weak. Pt takes 5mg  at home  Medication List     STOP taking these medications    furosemide  40 MG tablet Commonly known as: LASIX        TAKE these medications    albuterol  108 (90 Base) MCG/ACT inhaler Commonly known as: VENTOLIN  HFA Inhale 2 puffs into the lungs every 2 (two) hours as needed for wheezing or shortness of breath.   amLODipine  10 MG tablet Commonly known as: NORVASC  Take 1 tablet (10 mg total) by mouth daily.   carvedilol  12.5 MG  tablet Commonly known as: COREG  Take 1 tablet (12.5 mg total) by mouth 2 (two) times daily with a meal.   cloNIDine  0.3 MG tablet Commonly known as: CATAPRES  Take 1 tablet (0.3 mg total) by mouth 2 (two) times daily.   hydrALAZINE  100 MG tablet Commonly known as: APRESOLINE  Take 1 tablet (100 mg total) by mouth 3 (three) times daily.   insulin  degludec 200 UNIT/ML FlexTouch Pen Commonly known as: TRESIBA  Inject 6 Units into the skin daily at 10 pm.   insulin  lispro 100 UNIT/ML KwikPen Commonly known as: HumaLOG  KwikPen Inject 0-12 Units into the skin 3 (three) times daily. Short acting Humalog  insulin  per sliding scale 0-12 ---:-- Insulin  injection 0-12 Units 0-12 Units Subcutaneous, 3 times daily with meals CBG 70 - 120: 0 unit CBG 121 - 150: 0 unit  CBG 151 - 200: 2 unit CBG 201 - 250: 4 units CBG 251 - 300: 6 units CBG 301 - 350: 8 units  CBG 351 - 400: 10 units  CBG > 400: 12 units   isosorbide  mononitrate 60 MG 24 hr tablet Commonly known as: IMDUR  Take 1 tablet (60 mg total) by mouth daily.   magnesium  oxide 400 (240 Mg) MG tablet Commonly known as: MAG-OX Take 1 tablet (400 mg total) by mouth daily.   Potassium Chloride  ER 20 MEQ Tbcr Take 1 tablet by mouth daily.   Restasis  0.05 % ophthalmic emulsion Generic drug: cycloSPORINE  Place 1 drop into both eyes 2 (two) times daily.   rosuvastatin  5 MG tablet Commonly known as: CRESTOR  Take 1 tablet (5 mg total) by mouth daily.   torsemide  20 MG tablet Commonly known as: DEMADEX  Take 3 tablets (60 mg total) by mouth daily.        Follow-up Information     [facility] Follow up.   Specialty: Home Health Services Why: Will contact you to schedule home health visits. Contact information: [address] [place] [address] [phone]         Antonetta Rollene BRAVO, MD. Schedule an appointment as soon as possible for a visit in 1 week(s).   Specialty: Family Medicine Contact  information: [address], [address] [place] [address] [phone]                Allergies  Allergen Reactions   Farxiga [Dapagliflozin] Other (See Comments)    DKA   Metformin  And Related Diarrhea    Lost appetite and weight    Benadryl [Diphenhydramine Hcl] Hypertension   Citalopram Other (See Comments)    Unknown    Tramadol  Other (See Comments)    Felt light headed and dizzy   Amlodipine  Swelling   Crestor  [Rosuvastatin ] Swelling and Other (See Comments)    Feet swelling Makes her feel weak. Pt takes 5mg  at home    Consultations: GI   Procedures/Studies: DG ESOPHAGUS W DOUBLE CM (HD) Result Date: 03/12/2024 CLINICAL DATA:  Patient with occasional dysphagia which she describes as  fluid in mid chest making it hard to swallow. She reports improvement in symptoms with diuresis. Request for esophagram for further evaluation. EXAM: ESOPHAGUS/BARIUM SWALLOW/TABLET STUDY TECHNIQUE: Single contrast examination was performed using thin liquid barium. This exam was performed by Clotilda Hesselbach, PA-C, and was supervised and interpreted by Rad name. FLUOROSCOPY: Radiation Exposure Index (as provided by the fluoroscopic device): 9.40 mGy Kerma COMPARISON:  Chest x-ray 03/10/2024 FINDINGS: Swallowing: Appears normal. Vestibular penetration without aspiration seen. Pharynx: Unremarkable. Esophagus: Normal appearance. No significant intraluminal narrowing. Esophageal motility: Moderate dysmotility with retrograde flow of contrast Hiatal Hernia: None. Gastroesophageal reflux: None seen on limited exam Ingested 13 mm barium tablet: Passed normally Other: Exam limited due to patient difficulty positioning and standing independently. She was unable to lay prone for full motility examination. IMPRESSION: Single contrast esophagram significant for: 1.  Vestibular penetration without aspiration. 2.  Moderate esophageal dysmotility. Electronically Signed   By: Ester Sides M.D.   On:  03/12/2024 12:13   DG Chest 2 View Result Date: 03/10/2024 CLINICAL DATA:  Shortness of breath, leg swelling EXAM: CHEST - 2 VIEW COMPARISON:  02/04/2024 FINDINGS: Small bilateral pleural effusions. Mild cardiomegaly. Aortic atherosclerosis. Minimal bibasilar opacities, likely atelectasis. No overt edema. No acute bony abnormality. IMPRESSION: Cardiomegaly. Small bilateral pleural effusions with bibasilar atelectasis. Electronically Signed   By: Franky Crease M.D.   On: 03/10/2024 15:12     Discharge Exam: Vitals:   03/12/24 0606 03/12/24 1344  BP: (!) 177/61 137/65  Pulse: (!) 56 63  Resp: 19   Temp: 98.9 F (37.2 C) (!) 97.5 F (36.4 C)  SpO2: 96% 99%   Vitals:   03/11/24 2026 03/12/24 0218 03/12/24 0606 03/12/24 1344  BP: (!) 151/76  (!) 177/61 137/65  Pulse: 66  (!) 56 63  Resp: 17  19   Temp: 97.7 F (36.5 C)  98.9 F (37.2 C) (!) 97.5 F (36.4 C)  TempSrc: Oral   Oral  SpO2: 98% 96% 96% 99%  Weight:   56.4 kg   Height:        General: Pt is alert, awake, not in acute distress Cardiovascular: RRR, S1/S2 +, no rubs, no gallops Respiratory: CTA bilaterally, no wheezing, no rhonchi Abdominal: Soft, NT, ND, bowel sounds + Extremities: no edema, no cyanosis    The results of significant diagnostics from this hospitalization (including imaging, microbiology, ancillary and laboratory) are listed below for reference.     Microbiology: No results found for this or any previous visit (from the past 240 hours).   Labs: BNP (last 3 results) Recent Labs    02/04/24 1653 02/05/24 0517 03/10/24 1447  BNP 1,335.0* 1,316.0* 869.0*   Basic Metabolic Panel: Recent Labs  Lab 03/10/24 1447 03/11/24 0409 03/12/24 0406  NA 127* 128* 130*  K 3.5 3.4* 3.8  CL 87* 90* 92*  CO2 29 28 29   GLUCOSE 172* 174* 119*  BUN 66* 62* 67*  CREATININE 2.84* 2.73* 2.79*  CALCIUM  8.6* 8.2* 8.4*   Liver Function Tests: Recent Labs  Lab 03/10/24 1447  AST 23  ALT 11  ALKPHOS 63   BILITOT 0.5  PROT 5.0*  ALBUMIN  2.9*   No results for input(s): LIPASE, AMYLASE in the last 168 hours. No results for input(s): AMMONIA in the last 168 hours. CBC: Recent Labs  Lab 03/10/24 1447 03/12/24 0406  WBC 3.2* 3.5*  NEUTROABS 2.1  --   HGB 8.5* 7.9*  HCT 25.3* 23.7*  MCV 91.0 91.5  PLT 162 142*  Cardiac Enzymes: No results for input(s): CKTOTAL, CKMB, CKMBINDEX, TROPONINI in the last 168 hours. BNP: Invalid input(s): POCBNP CBG: Recent Labs  Lab 03/11/24 1115 03/11/24 1639 03/11/24 2115 03/12/24 0733 03/12/24 1154  GLUCAP 208* 247* 287* 95 225*   D-Dimer No results for input(s): DDIMER in the last 72 hours. Hgb A1c No results for input(s): HGBA1C in the last 72 hours. Lipid Profile No results for input(s): CHOL, HDL, LDLCALC, TRIG, CHOLHDL, LDLDIRECT in the last 72 hours. Thyroid  function studies No results for input(s): TSH, T4TOTAL, T3FREE, THYROIDAB in the last 72 hours.  Invalid input(s): FREET3 Anemia work up No results for input(s): VITAMINB12, FOLATE, FERRITIN, TIBC, IRON , RETICCTPCT in the last 72 hours. Urinalysis    Component Value Date/Time   COLORURINE STRAW (A) 01/25/2024 0821   APPEARANCEUR CLEAR 01/25/2024 0821   LABSPEC 1.007 01/25/2024 0821   PHURINE 5.0 01/25/2024 0821   GLUCOSEU 150 (A) 01/25/2024 0821   HGBUR NEGATIVE 01/25/2024 0821   HGBUR trace-intact 04/28/2010 1259   BILIRUBINUR NEGATIVE 01/25/2024 0821   BILIRUBINUR neg 09/29/2013 1341   KETONESUR NEGATIVE 01/25/2024 0821   PROTEINUR >=300 (A) 01/25/2024 0821   UROBILINOGEN 0.2 09/29/2013 1341   UROBILINOGEN 0.2 12/28/2010 1743   NITRITE NEGATIVE 01/25/2024 0821   LEUKOCYTESUR NEGATIVE 01/25/2024 0821   Sepsis Labs Recent Labs  Lab 03/10/24 1447 03/12/24 0406  WBC 3.2* 3.5*   Microbiology No results found for this or any previous visit (from the past 240 hours).   Time coordinating discharge: 35  minutes  SIGNED:   Adron JONETTA Fairly, DO [facility] 03/12/2024, 2:37 PM  If 7PM-7AM, please contact night-coverage [email or web address]

## 2024-03-12 NOTE — Care Management Important Message (Signed)
 Important Message  Patient Details  Name: Emily Jennings MRN: [id number] Date of Birth: [birth year]   Important Message Given:  N/A - LOS <3 / Initial given by admissions     Duwaine LITTIE Ada 03/12/2024, 3:31 PM

## 2024-03-12 NOTE — Progress Notes (Signed)
 Mobility Specialist Progress Note:    03/12/24 1533  Mobility  Activity Ambulated with assistance in room;Ambulated with assistance to bathroom  Level of Assistance Contact guard assist, steadying assist  Assistive Device None  Distance Ambulated (ft) 15 ft  Range of Motion/Exercises Active;All extremities  Activity Response Tolerated well  Mobility Referral Yes  Mobility visit 1 Mobility  Mobility Specialist Start Time (ACUTE ONLY) 1504  Mobility Specialist Stop Time (ACUTE ONLY) 1522  Mobility Specialist Time Calculation (min) (ACUTE ONLY) 18 min   Pt received requesting assistance to bathroom. Required CGA to stand and ambulate. Tolerated well, asx throughout. Returned supine, alarm on. All needs met.   Sherrilee Ditty Mobility Specialist Please contact via Special educational needs teacher or  Rehab office at [phone]

## 2024-03-12 NOTE — TOC Progression Note (Signed)
 Transition of Care [facility]) - Progression Note    Patient Details  Name: Emily Jennings MRN: [id number] Date of Birth: 1934-02-05  Transition of Care [facility]) CM/SW Contact  [place] Saddie Kim, [place] Phone Number: 03/12/2024, 8:35 AM  Clinical Narrative:  LCSW discussed HHPT recommendation with pt who is agreeable. Pt recently had [facility] and is agreeable to agency again. Referred and accepted by [facility] with [facility]. TOC will follow.      Expected Discharge Plan: Home w Home Health Services Barriers to Discharge: Continued Medical Work up  Expected Discharge Plan and Services In-house Referral: Clinical Social Work Discharge Planning Services: CM Consult Post Acute Care Choice: Home Health Living arrangements for the past 2 months: Single Family Home                           HH Arranged: PT HH Agency: [facility] Date [facility] Agency Contacted: 03/12/24 Time HH Agency Contacted: [phone] Representative spoke with at [facility] Agency: Darleene   Social Determinants of Health (SDOH) Interventions SDOH Screenings   Food Insecurity: No Food Insecurity (03/10/2024)  Housing: Low Risk  (03/10/2024)  Transportation Needs: No Transportation Needs (03/10/2024)  Utilities: Not At Risk (03/10/2024)  Alcohol Screen: Low Risk  (11/15/2022)  Depression (PHQ2-9): Low Risk  (10/24/2023)  Financial Resource Strain: Low Risk  (12/28/2022)  Physical Activity: Sufficiently Active (11/15/2022)  Social Connections: Socially Integrated (03/10/2024)  Stress: No Stress Concern Present (12/28/2022)  Tobacco Use: Low Risk  (03/10/2024)    Readmission Risk Interventions    03/11/2024    8:30 AM 02/05/2024   12:02 PM 02/01/2024    9:55 AM  Readmission Risk Prevention Plan  Transportation Screening Complete Complete Complete  HRI or Home Care Consult   Complete  Social Work Consult for Recovery Care Planning/Counseling   Complete  Palliative Care Screening   Complete  Medication Review [employer]) Complete  Complete Complete  PCP or Specialist appointment within 3-5 days of discharge  Not Complete   HRI or Home Care Consult Complete Complete   SW Recovery Care/Counseling Consult Complete Complete   Palliative Care Screening Not Applicable Not Applicable   Skilled Nursing Facility Complete Complete

## 2024-03-13 ENCOUNTER — Telehealth: Payer: Self-pay

## 2024-03-13 NOTE — Transitions of Care (Post Inpatient/ED Visit) (Signed)
   03/13/2024  Name: Emily Jennings MRN: [id number] DOB: [birth year]  Today's TOC FU Call Status: Today's TOC FU Call Status:: Successful TOC FU Call Completed TOC FU Call Complete Date: 03/13/24 Patient's Name and Date of Birth confirmed.  Transition Care Management Follow-up Telephone Call Date of Discharge: 03/12/24 [facility] Type of Discharge: Inpatient Admission Primary Inpatient Discharge Diagnosis:: Acute on chronic congestive heart failure with left ventricular diastolic dysfunction How have you been since you were released from the hospital?: Better Any questions or concerns?: No  Items Reviewed: Did you receive and understand the discharge instructions provided?: Yes Dietary orders reviewed?: Yes  Placed call to patient and spoke with husband.  Husband reports that patient is doing much better. Reports that he has already gone over all instructions with nurse and states that he understands.   Declines need to review again.  Encouraged patient to follow low salt diet and weigh daily. Explained reason low salt diet and weights.  Husband voiced understanding. Encouraged husband to call MD for any changes in condition.   Husband declined to review discharge instructions and states he has already done this for 45 minutes with a nurse.   Alan Ee, RN, BSN, CEN [employer]- Transition of Care Team.  [facility] [phone]

## 2024-03-17 DIAGNOSIS — I5033 Acute on chronic diastolic (congestive) heart failure: Secondary | ICD-10-CM | POA: Diagnosis not present

## 2024-03-17 DIAGNOSIS — J9811 Atelectasis: Secondary | ICD-10-CM | POA: Diagnosis not present

## 2024-03-17 DIAGNOSIS — R1310 Dysphagia, unspecified: Secondary | ICD-10-CM | POA: Diagnosis not present

## 2024-03-17 DIAGNOSIS — G4700 Insomnia, unspecified: Secondary | ICD-10-CM | POA: Diagnosis not present

## 2024-03-17 DIAGNOSIS — Z86000 Personal history of in-situ neoplasm of breast: Secondary | ICD-10-CM | POA: Diagnosis not present

## 2024-03-17 DIAGNOSIS — M810 Age-related osteoporosis without current pathological fracture: Secondary | ICD-10-CM | POA: Diagnosis not present

## 2024-03-17 DIAGNOSIS — E876 Hypokalemia: Secondary | ICD-10-CM | POA: Diagnosis not present

## 2024-03-17 DIAGNOSIS — Z794 Long term (current) use of insulin: Secondary | ICD-10-CM | POA: Diagnosis not present

## 2024-03-17 DIAGNOSIS — E871 Hypo-osmolality and hyponatremia: Secondary | ICD-10-CM | POA: Diagnosis not present

## 2024-03-17 DIAGNOSIS — E1122 Type 2 diabetes mellitus with diabetic chronic kidney disease: Secondary | ICD-10-CM | POA: Diagnosis not present

## 2024-03-17 DIAGNOSIS — K219 Gastro-esophageal reflux disease without esophagitis: Secondary | ICD-10-CM | POA: Diagnosis not present

## 2024-03-17 DIAGNOSIS — I4729 Other ventricular tachycardia: Secondary | ICD-10-CM | POA: Diagnosis not present

## 2024-03-17 DIAGNOSIS — D631 Anemia in chronic kidney disease: Secondary | ICD-10-CM | POA: Diagnosis not present

## 2024-03-17 DIAGNOSIS — I110 Hypertensive heart disease with heart failure: Secondary | ICD-10-CM | POA: Diagnosis not present

## 2024-03-17 DIAGNOSIS — E861 Hypovolemia: Secondary | ICD-10-CM | POA: Diagnosis not present

## 2024-03-17 DIAGNOSIS — I2510 Atherosclerotic heart disease of native coronary artery without angina pectoris: Secondary | ICD-10-CM | POA: Diagnosis not present

## 2024-03-17 DIAGNOSIS — Z9181 History of falling: Secondary | ICD-10-CM | POA: Diagnosis not present

## 2024-03-17 DIAGNOSIS — N184 Chronic kidney disease, stage 4 (severe): Secondary | ICD-10-CM | POA: Diagnosis not present

## 2024-03-20 ENCOUNTER — Ambulatory Visit: Payer: Self-pay | Admitting: Family Medicine

## 2024-03-20 ENCOUNTER — Encounter: Payer: Self-pay | Admitting: Family Medicine

## 2024-03-20 VITALS — BP 137/78 | Resp 12 | Ht 60.0 in | Wt 124.0 lb

## 2024-03-20 DIAGNOSIS — Z7689 Persons encountering health services in other specified circumstances: Secondary | ICD-10-CM | POA: Diagnosis not present

## 2024-03-20 DIAGNOSIS — N1831 Chronic kidney disease, stage 3a: Secondary | ICD-10-CM

## 2024-03-20 DIAGNOSIS — N1832 Chronic kidney disease, stage 3b: Secondary | ICD-10-CM | POA: Diagnosis not present

## 2024-03-20 DIAGNOSIS — E1122 Type 2 diabetes mellitus with diabetic chronic kidney disease: Secondary | ICD-10-CM | POA: Diagnosis not present

## 2024-03-20 DIAGNOSIS — Z66 Do not resuscitate: Secondary | ICD-10-CM

## 2024-03-20 DIAGNOSIS — Z794 Long term (current) use of insulin: Secondary | ICD-10-CM | POA: Diagnosis not present

## 2024-03-20 DIAGNOSIS — I5033 Acute on chronic diastolic (congestive) heart failure: Secondary | ICD-10-CM | POA: Diagnosis not present

## 2024-03-20 MED ORDER — DEXCOM G7 SENSOR MISC: 0 refills | Status: DC

## 2024-03-20 MED ORDER — DEXCOM G7 RECEIVER DEVI: 0 refills | Status: DC

## 2024-03-20 NOTE — Patient Instructions (Signed)
 F/U in 4 months, call if you need me sooner  DNR form signed is to be kept at home in a place where you can locate it  Continue to take medications as  you are taking them, and to limit sALT AND WATER INTAKE  Thanks for choosing [facility], we consider it a privelige to serve you.

## 2024-03-23 ENCOUNTER — Encounter: Payer: Self-pay | Admitting: Family Medicine

## 2024-03-23 NOTE — Progress Notes (Signed)
   DESIRRE EICKHOFF     MRN: [id number]      DOB: [birth year]  Chief Complaint  Patient presents with   Hospitalization Follow-up    Admitted 06/23 for SOB and lower extremity swelling     HPI Ms. Rando is here for [facility] visit, hospitalized from 6/23 to 03/12/2024, with acute on chronic heart failure and  stage 4 CKD  ROS Denies recent fever or chills. Denies sinus pressure, nasal congestion, ear pain or sore throat. Denies chest congestion, productive cough or wheezing. Denies chest pains, palpitations and leg swelling Denies abdominal pain, nausea, vomiting,diarrhea or constipation.  Appetite is fair, denies dysphagia currently, only has difficulty swallowing pills and does not want then crushed Denies dysuria, frequency, hesitancy or incontinence. Denies uncontrolled  joint pain, swelling and limitation in mobility. Denies headaches, seizures, Denies depression, anxiety or insomnia. Denies skin break down or rash.   PE BP 137/78   Resp 12   Ht 5' (1.524 m)   Wt 124 lb (56.2 kg)   SpO2 95%   BMI 24.22 kg/m    Patient alert and oriented and in no cardiopulmonary distress.  HEENT: No facial asymmetry, EOMI,     Neck supple .  Chest: decreased though adequate air entry, bi basilar crackles , no wheezes CVS: S1, S2 no murmurs, no S3.Regular rate.  ABD: Soft non tender.   Ext: trace  edema  MS: Adequate  though reduced ROM spine, shoulders, hips and knees.  Skin: Intact, no ulcerations or rash noted.  Psych: Good eye contact, normal affect. not anxious or depressed appearing.  CNS: CN 2-12 intact, power,  normal throughout.no focal deficits noted.   Assessment & Plan  DNR (do not resuscitate) Pt and spouse both understand and fully agree that in the event of cardiopulmonary arrest, no attempt at resuscitation must be attempted. They are not interested in hospice or palliative care  involvement at this time  Acute on chronic congestive heart failure with left ventricular  diastolic dysfunction (HCC) Resolved post recent hospitalization, at high risk for recurrence given stage 4 CKD as well Pt compliant with medication and attempts to monitor water and sodium intake Spouse assists in manageing meds, request that [facility] put out pills weekly, will reach out   Chronic kidney disease (CKD) stage G3b/A2, moderately decreased glomerular filtration rate (GFR) between 30-44 mL/min/1.73 square meter and albuminuria creatinine ratio between 30-299 mg/g (HCC) Pt has no interest in or plan to accept dialysis, supportive management only at this time   Encounter for support and coordination of transition of care Patient in for follow up of recent hospitalization for [facility] visit Discharge summary, and laboratory and radiology data are reviewed, and any questions or concerns  are discussed. Specific issues requiring follow up are specifically addressed.

## 2024-03-23 NOTE — Assessment & Plan Note (Signed)
 Resolved post recent hospitalization, at high risk for recurrence given stage 4 CKD as well Pt compliant with medication and attempts to monitor water and sodium intake Spouse assists in manageing meds, request that [facility] put out pills weekly, will reach out

## 2024-03-23 NOTE — Assessment & Plan Note (Signed)
 Pt has no interest in or plan to accept dialysis, supportive management only at this time

## 2024-03-23 NOTE — Assessment & Plan Note (Addendum)
Patient in for follow up of recent hospitalization.for [facility] visit Discharge summary, and laboratory and radiology data are reviewed, and any questions or concerns  are discussed. Specific issues requiring follow up are specifically addressed.

## 2024-03-23 NOTE — Assessment & Plan Note (Signed)
 Pt and spouse both understand and fully agree that in the event of cardiopulmonary arrest, no attempt at resuscitation must be attempted. They are not interested in hospice or palliative care  involvement at this time

## 2024-03-24 DIAGNOSIS — I4729 Other ventricular tachycardia: Secondary | ICD-10-CM | POA: Diagnosis not present

## 2024-03-24 DIAGNOSIS — R1310 Dysphagia, unspecified: Secondary | ICD-10-CM | POA: Diagnosis not present

## 2024-03-24 DIAGNOSIS — I110 Hypertensive heart disease with heart failure: Secondary | ICD-10-CM | POA: Diagnosis not present

## 2024-03-24 DIAGNOSIS — E861 Hypovolemia: Secondary | ICD-10-CM | POA: Diagnosis not present

## 2024-03-24 DIAGNOSIS — K219 Gastro-esophageal reflux disease without esophagitis: Secondary | ICD-10-CM | POA: Diagnosis not present

## 2024-03-24 DIAGNOSIS — Z9181 History of falling: Secondary | ICD-10-CM | POA: Diagnosis not present

## 2024-03-24 DIAGNOSIS — D631 Anemia in chronic kidney disease: Secondary | ICD-10-CM | POA: Diagnosis not present

## 2024-03-24 DIAGNOSIS — E876 Hypokalemia: Secondary | ICD-10-CM | POA: Diagnosis not present

## 2024-03-24 DIAGNOSIS — I5033 Acute on chronic diastolic (congestive) heart failure: Secondary | ICD-10-CM | POA: Diagnosis not present

## 2024-03-24 DIAGNOSIS — Z86000 Personal history of in-situ neoplasm of breast: Secondary | ICD-10-CM | POA: Diagnosis not present

## 2024-03-24 DIAGNOSIS — Z794 Long term (current) use of insulin: Secondary | ICD-10-CM | POA: Diagnosis not present

## 2024-03-24 DIAGNOSIS — I2510 Atherosclerotic heart disease of native coronary artery without angina pectoris: Secondary | ICD-10-CM | POA: Diagnosis not present

## 2024-03-24 DIAGNOSIS — N184 Chronic kidney disease, stage 4 (severe): Secondary | ICD-10-CM | POA: Diagnosis not present

## 2024-03-24 DIAGNOSIS — G4700 Insomnia, unspecified: Secondary | ICD-10-CM | POA: Diagnosis not present

## 2024-03-24 DIAGNOSIS — J9811 Atelectasis: Secondary | ICD-10-CM | POA: Diagnosis not present

## 2024-03-24 DIAGNOSIS — E871 Hypo-osmolality and hyponatremia: Secondary | ICD-10-CM | POA: Diagnosis not present

## 2024-03-24 DIAGNOSIS — E1122 Type 2 diabetes mellitus with diabetic chronic kidney disease: Secondary | ICD-10-CM | POA: Diagnosis not present

## 2024-03-24 DIAGNOSIS — M810 Age-related osteoporosis without current pathological fracture: Secondary | ICD-10-CM | POA: Diagnosis not present

## 2024-03-27 ENCOUNTER — Telehealth: Payer: Self-pay

## 2024-03-27 DIAGNOSIS — E871 Hypo-osmolality and hyponatremia: Secondary | ICD-10-CM | POA: Diagnosis not present

## 2024-03-27 DIAGNOSIS — Z9181 History of falling: Secondary | ICD-10-CM | POA: Diagnosis not present

## 2024-03-27 DIAGNOSIS — Z86000 Personal history of in-situ neoplasm of breast: Secondary | ICD-10-CM | POA: Diagnosis not present

## 2024-03-27 DIAGNOSIS — G4700 Insomnia, unspecified: Secondary | ICD-10-CM | POA: Diagnosis not present

## 2024-03-27 DIAGNOSIS — K219 Gastro-esophageal reflux disease without esophagitis: Secondary | ICD-10-CM | POA: Diagnosis not present

## 2024-03-27 DIAGNOSIS — I2510 Atherosclerotic heart disease of native coronary artery without angina pectoris: Secondary | ICD-10-CM | POA: Diagnosis not present

## 2024-03-27 DIAGNOSIS — I110 Hypertensive heart disease with heart failure: Secondary | ICD-10-CM | POA: Diagnosis not present

## 2024-03-27 DIAGNOSIS — N184 Chronic kidney disease, stage 4 (severe): Secondary | ICD-10-CM | POA: Diagnosis not present

## 2024-03-27 DIAGNOSIS — M810 Age-related osteoporosis without current pathological fracture: Secondary | ICD-10-CM | POA: Diagnosis not present

## 2024-03-27 DIAGNOSIS — I5033 Acute on chronic diastolic (congestive) heart failure: Secondary | ICD-10-CM | POA: Diagnosis not present

## 2024-03-27 DIAGNOSIS — Z794 Long term (current) use of insulin: Secondary | ICD-10-CM | POA: Diagnosis not present

## 2024-03-27 DIAGNOSIS — R1310 Dysphagia, unspecified: Secondary | ICD-10-CM | POA: Diagnosis not present

## 2024-03-27 DIAGNOSIS — E876 Hypokalemia: Secondary | ICD-10-CM | POA: Diagnosis not present

## 2024-03-27 DIAGNOSIS — D631 Anemia in chronic kidney disease: Secondary | ICD-10-CM | POA: Diagnosis not present

## 2024-03-27 DIAGNOSIS — E1122 Type 2 diabetes mellitus with diabetic chronic kidney disease: Secondary | ICD-10-CM | POA: Diagnosis not present

## 2024-03-27 DIAGNOSIS — E861 Hypovolemia: Secondary | ICD-10-CM | POA: Diagnosis not present

## 2024-03-27 DIAGNOSIS — J9811 Atelectasis: Secondary | ICD-10-CM | POA: Diagnosis not present

## 2024-03-27 DIAGNOSIS — I4729 Other ventricular tachycardia: Secondary | ICD-10-CM | POA: Diagnosis not present

## 2024-03-27 NOTE — Telephone Encounter (Signed)
 Tried calling number in note, could not complete call. Aslo tried 336 area code instead, number not in service

## 2024-03-27 NOTE — Telephone Encounter (Signed)
 I recommned reaching out to Dr Archie for any adjustment of her medications from swelling and weight gain

## 2024-03-27 NOTE — Telephone Encounter (Signed)
 Copied from CRM [id number]. Topic: General - Other >> Mar 27, 2024  1:00 PM Kevelyn M wrote: Reason for CRM: [facility] calling in because her weight is up 5 pounds today. Yesterday she was 120lbs and today she is 125lbs. She has two plus edema to both her legs including her thighs and her right hand. Her lungs are clear. Current dosage of the Torsemide  20mg  tablets, 3 tablets once a day. [phone]

## 2024-03-28 ENCOUNTER — Ambulatory Visit: Payer: Self-pay

## 2024-03-28 ENCOUNTER — Telehealth: Payer: Self-pay | Admitting: Family Medicine

## 2024-03-28 DIAGNOSIS — I2510 Atherosclerotic heart disease of native coronary artery without angina pectoris: Secondary | ICD-10-CM | POA: Diagnosis not present

## 2024-03-28 DIAGNOSIS — E876 Hypokalemia: Secondary | ICD-10-CM | POA: Diagnosis not present

## 2024-03-28 DIAGNOSIS — R1310 Dysphagia, unspecified: Secondary | ICD-10-CM | POA: Diagnosis not present

## 2024-03-28 DIAGNOSIS — E1122 Type 2 diabetes mellitus with diabetic chronic kidney disease: Secondary | ICD-10-CM | POA: Diagnosis not present

## 2024-03-28 DIAGNOSIS — N184 Chronic kidney disease, stage 4 (severe): Secondary | ICD-10-CM | POA: Diagnosis not present

## 2024-03-28 DIAGNOSIS — E871 Hypo-osmolality and hyponatremia: Secondary | ICD-10-CM | POA: Diagnosis not present

## 2024-03-28 DIAGNOSIS — I4729 Other ventricular tachycardia: Secondary | ICD-10-CM | POA: Diagnosis not present

## 2024-03-28 DIAGNOSIS — I5033 Acute on chronic diastolic (congestive) heart failure: Secondary | ICD-10-CM | POA: Diagnosis not present

## 2024-03-28 DIAGNOSIS — J9811 Atelectasis: Secondary | ICD-10-CM | POA: Diagnosis not present

## 2024-03-28 DIAGNOSIS — D631 Anemia in chronic kidney disease: Secondary | ICD-10-CM | POA: Diagnosis not present

## 2024-03-28 DIAGNOSIS — E861 Hypovolemia: Secondary | ICD-10-CM | POA: Diagnosis not present

## 2024-03-28 DIAGNOSIS — K219 Gastro-esophageal reflux disease without esophagitis: Secondary | ICD-10-CM | POA: Diagnosis not present

## 2024-03-28 DIAGNOSIS — Z9181 History of falling: Secondary | ICD-10-CM | POA: Diagnosis not present

## 2024-03-28 DIAGNOSIS — M810 Age-related osteoporosis without current pathological fracture: Secondary | ICD-10-CM | POA: Diagnosis not present

## 2024-03-28 DIAGNOSIS — Z86000 Personal history of in-situ neoplasm of breast: Secondary | ICD-10-CM | POA: Diagnosis not present

## 2024-03-28 DIAGNOSIS — I110 Hypertensive heart disease with heart failure: Secondary | ICD-10-CM | POA: Diagnosis not present

## 2024-03-28 DIAGNOSIS — Z794 Long term (current) use of insulin: Secondary | ICD-10-CM | POA: Diagnosis not present

## 2024-03-28 DIAGNOSIS — G4700 Insomnia, unspecified: Secondary | ICD-10-CM | POA: Diagnosis not present

## 2024-03-28 NOTE — Telephone Encounter (Signed)
 Copied from CRM [id number]. Topic: General - Other >> Mar 27, 2024  1:00 PM Kevelyn M wrote: Reason for CRM: [facility] calling in because her weight is up 5 pounds today. Yesterday she was 120lbs and today she is 125lbs. She has two plus edema to both her legs including her thighs and her right hand. Her lungs are clear. Current dosage of the Torsemide  20mg  tablets, 3 tablets once a day. #[phone] >> Mar 28, 2024 12:52 PM Zebedee SAUNDERS wrote: Received call from [facility]  #[phone] calling in because her weight is up 5 pounds today. Yesterday she was 125lbs and today 128 lbs. She has two plus edema to both her legs including her thighs and her right hand. Her lungs are clear. Current dosage of the Torsemide  20mg  tablets, 3 tablets once a day.

## 2024-03-28 NOTE — Telephone Encounter (Signed)
 FYI Only or Action Required?: FYI only for provider.  Patient was last seen in primary care on 03/20/2024 by Antonetta Rollene BRAVO, MD.  Called Nurse Triage reporting Leg Swelling.  Symptoms began several days ago.  Interventions attempted: Nothing.  Symptoms are: gradually worsening.  Triage Disposition: See Physician Within 24 Hours  Patient/caregiver understands and will follow disposition?: No, wishes to speak with PCP  weights 128 today  125 Thursday  120 wednesday     Copied from CRM #[id number]. Topic: Clinical - Red Word Triage >> Mar 28, 2024 12:53 PM Zebedee SAUNDERS wrote: Red Word that prompted transfer to Nurse Triage: [facility]  [phone] calling in because pt's weight is up 5 pounds 125lbs the day before and now 3 lbs up. She has two plus edema to both her legs including her thighs and her right hand. Her lungs are clear. Current dosage of the Torsemide  20mg  tablets, 3 tablets once a day. Reason for Disposition  [1] MODERATE leg swelling (e.g., swelling extends up to knees) AND [2] new-onset or getting worse  Answer Assessment - Initial Assessment Questions 1. ONSET: When did the swelling start? (e.g., minutes, hours, days)     About 4 days ago 2. LOCATION: What part of the leg is swollen?  Are both legs swollen or just one leg?     Both legs 3. SEVERITY: How bad is the swelling? (e.g., localized; mild, moderate, severe)     mod 4. REDNESS: Is there redness or signs of infection?     no  7. CAUSE: What do you think is causing the leg swelling?     Fluid rention 8. MEDICAL HISTORY: Do you have a history of blood clots (e.g., DVT), cancer, heart failure, kidney disease, or liver failure?     ckd  10. OTHER SYMPTOMS: Do you have any other symptoms? (e.g., chest pain, difficulty breathing)       no  Protocols used: Leg Swelling and Edema-A-AH

## 2024-03-28 NOTE — Telephone Encounter (Signed)
 Please advise Copied from CRM [id number]. Topic: General - Other >> Mar 27, 2024  1:00 PM Kevelyn M wrote: Reason for CRM: [facility] calling in because her weight is up 5 pounds today. Yesterday she was 120lbs and today she is 125lbs. She has two plus edema to both her legs including her thighs and her right hand. Her lungs are clear. Current dosage of the Torsemide  20mg  tablets, 3 tablets once a day. #[phone] >> Mar 28, 2024 12:52 PM Zebedee SAUNDERS wrote: Received call from [facility]  #[phone] calling in because her weight is up 5 pounds today. Yesterday she was 125lbs and today 128 lbs. She has two plus edema to both her legs including her thighs and her right hand. Her lungs are clear. Current dosage of the Torsemide  20mg  tablets, 3 tablets once a day.

## 2024-03-31 DIAGNOSIS — I110 Hypertensive heart disease with heart failure: Secondary | ICD-10-CM | POA: Diagnosis not present

## 2024-03-31 DIAGNOSIS — D631 Anemia in chronic kidney disease: Secondary | ICD-10-CM | POA: Diagnosis not present

## 2024-03-31 DIAGNOSIS — E861 Hypovolemia: Secondary | ICD-10-CM | POA: Diagnosis not present

## 2024-03-31 DIAGNOSIS — J9811 Atelectasis: Secondary | ICD-10-CM | POA: Diagnosis not present

## 2024-03-31 DIAGNOSIS — E871 Hypo-osmolality and hyponatremia: Secondary | ICD-10-CM | POA: Diagnosis not present

## 2024-03-31 DIAGNOSIS — E1122 Type 2 diabetes mellitus with diabetic chronic kidney disease: Secondary | ICD-10-CM | POA: Diagnosis not present

## 2024-03-31 DIAGNOSIS — N184 Chronic kidney disease, stage 4 (severe): Secondary | ICD-10-CM | POA: Diagnosis not present

## 2024-03-31 DIAGNOSIS — Z86000 Personal history of in-situ neoplasm of breast: Secondary | ICD-10-CM | POA: Diagnosis not present

## 2024-03-31 DIAGNOSIS — K219 Gastro-esophageal reflux disease without esophagitis: Secondary | ICD-10-CM | POA: Diagnosis not present

## 2024-03-31 DIAGNOSIS — M810 Age-related osteoporosis without current pathological fracture: Secondary | ICD-10-CM | POA: Diagnosis not present

## 2024-03-31 DIAGNOSIS — E876 Hypokalemia: Secondary | ICD-10-CM | POA: Diagnosis not present

## 2024-03-31 DIAGNOSIS — I2510 Atherosclerotic heart disease of native coronary artery without angina pectoris: Secondary | ICD-10-CM | POA: Diagnosis not present

## 2024-03-31 DIAGNOSIS — R1310 Dysphagia, unspecified: Secondary | ICD-10-CM | POA: Diagnosis not present

## 2024-03-31 DIAGNOSIS — G4700 Insomnia, unspecified: Secondary | ICD-10-CM | POA: Diagnosis not present

## 2024-03-31 DIAGNOSIS — I4729 Other ventricular tachycardia: Secondary | ICD-10-CM | POA: Diagnosis not present

## 2024-03-31 DIAGNOSIS — I5033 Acute on chronic diastolic (congestive) heart failure: Secondary | ICD-10-CM | POA: Diagnosis not present

## 2024-03-31 DIAGNOSIS — Z794 Long term (current) use of insulin: Secondary | ICD-10-CM | POA: Diagnosis not present

## 2024-03-31 DIAGNOSIS — Z9181 History of falling: Secondary | ICD-10-CM | POA: Diagnosis not present

## 2024-03-31 NOTE — Progress Notes (Unsigned)
 Cardiology Office Note:  .   Date:  04/01/2024  ID:  Emily Jennings, DOB 07-25-1934, MRN [id number] PCP: Antonetta Rollene BRAVO, MD  [facility] Providers Cardiologist:  Alvan Carrier, MD {  History of Present Illness: .   Emily Jennings is a 89 y.o. female  with PMHx of chronic HFpEF (07/2022 & 08/2022 60-65%, 04/2023 65-70%, 01/2024 70-75%), valvular heart disease (ECHO 01/2024: mild MVR, mild to moderate AS), HTN, HLD, Type II DM, Stage 3-4 CKD and GERD who reports to [place] office for follow up.   Last seen in heartcare 11/23/2023 with Emily Qua, PA-C for overdue follow up. Reports intermittent LE edema. Denied any other cardiac complaints. Discussed importance of leg elevation. Continue on current regimen.   Recent hospitalization 6/23-25/2025 for progressive LE swelling, shortness of breath with exertion for several days, 20 pound weight gain and trouble with swallowing. Admitted with acute on chronic HFpEF.  Complicated by Anemia Hgb 8.5 >7.9. GI was consulted and underwent barium swallow evaluation with no significant findings. Diuresed with IV lasix , discontinued lasix  and discharged on Torsemide  60 mg daily.  Continued on amlodipine  10 mg daily, Coreg  12.5 mg BID, clonidine  0.3 mg BID, hydralazine  100 mg TID, Imdur  60 mg daily.   Today, patient is accompanied by husband and history is obtained from both. Report chronic SOB with minimal exertion and relieved with rest that has got worse x 2 weeks. Report LE edema and orthopnea since leaving hospital. Reports no improvement since being released from hospital. Noted approximately 3 days ago the home nurse increased Torsemide  to 40 mg in the am and 40 mg in pm. Denies any changes in SOB or edema since medication change. Denies CP, palpitations, syncope. Reports home SBP 160's. Reports low sodium diet. Lives at home with husband. She is not sure what she meds she take but states they are managed by her husband and he reports compliance  with medications based on last hospital discharge. Ambulates with cane or walker. Denies tobacco use/Binging ETOH/drug use   ROS: 10 point review of system has been reviewed and considered negative except ones been listed in the HPI.   Studies Reviewed: [name]   ECHO 02/05/2024 IMPRESSIONS   1. Left ventricular ejection fraction, by estimation, is 70 to 75%. The  left ventricle has hyperdynamic function. The left ventricle has no  regional wall motion abnormalities. There is moderate asymmetric left  ventricular hypertrophy. Left ventricular  diastolic parameters are consistent with Grade II diastolic dysfunction  (pseudonormalization).   2. Right ventricular systolic function is normal. The right ventricular  size is normal. Mildly increased right ventricular wall thickness.   3. Left atrial size was severely dilated.   4. Right atrial size was moderately dilated.   5. Elongated and thickened mitral chordae There is systolic anteror  motion of chord into LVOT during systole Similar to findings in echo from  August 2024. [name] The mitral valve is myxomatous. Mild mitral valve  regurgitation.   6. AV is thickened, calcified with restricted motion. Peak and mean  gradients through the valve are 46 and 24 mm Hg respectively. AVA (VTI) is  1.15 cm2. Dimensionless indext is 0.45 Ovreall consistent with mild to  moderate AS. [name] The aortic valve is  tricuspid. Aortic valve regurgitation is trivial.   7. The inferior vena cava is dilated in size with >50% respiratory  variability, suggesting right atrial pressure of 8 mmHg.   Physical Exam:   VS:  BP (!) 160/78 (  BP Location: Right Arm, Patient Position: Sitting, Cuff Size: Normal)   Pulse 60   Ht 5' (1.524 m)   Wt 131 lb (59.4 kg)   SpO2 93%   BMI 25.58 kg/m    Wt Readings from Last 3 Encounters:  04/01/24 131 lb (59.4 kg)  03/20/24 124 lb (56.2 kg)  03/12/24 124 lb 5.4 oz (56.4 kg)    GEN: Well nourished, well developed in no acute distress  while sitting in chair.  NECK: No JVD; No carotid bruits CARDIAC: RRR, systolic murmur in left upper sternal border 3/6; no rubs, gallops RESPIRATORY:  Rales in bilateral bases ABDOMEN: Soft, non-tender, non-distended EXTREMITIES:  2+ pitting edema; right hand edema; No deformity   ASSESSMENT AND PLAN: .   Chronic heart failure with preserved ejection fraction (HCC) 07/2022 & 08/2022 60-65%, 04/2023 65-70%, 01/2024 70-75% ECHO 01/2024: EF 70 to 75%, moderate asymmetric LVH, G2 DD, severely dilated LA, moderately dilated RA Report chronic SOB with minimal exertion and relieved with rest that has got worse x 2 weeks. Report LE edema and orthopnea since leaving hospital. Reports no improvement since being released from hospital. Noted approximately 3 days ago the home nurse increased Torsemide  to 40 mg in the am and 40 mg in pm. Denies any changes in SOB or edema since medication change Volume overloaded on exam with 2+ pitting edema bilateral, right hand edema, and bilateral rales in lung bases.  Increase Torsemide  to 60 mg am and 40 mg in pm.  Order BMP and BNP. If Cr and BNP remains elevated or higher than last hospital visit, then would encourage ER visit.  Reviewed labs: BNP 839 (vs 869 03/10/2024); Na 127, K 3.8, Cr 2.53 (vs 2.79 on 03/12/2024). Will order 1 week follow up BMP.  Continue Coreg  12.5 mg BID, KCL 20 MEQ daily  Defer SGLT2i given her renal function as below.   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 Also noted on echo in 04/2023 as well with only mild MR and mild to moderate aortic stenosis.  ECHO 01/2024: mild MVR, mild to moderate AS Will plan to repeat ECHO in 01/2025.  Previously noted, she most likely not a candidate for TAVR given inability to undergo catheterization and pre-procedure CT's due to her CKD.   Essential  hypertension Reports home SBP mainly in 160.  BP in OV is elevated today: 178/63, repeat BP 168/78 Suspect amlodipine  can be contributing to edema.  Discontinue Amlodipine  and Order Nifedipine  60 mg daily.  Torsemide  increased as above.  Continue on Coreg  12.5 mg twice daily, Clonidine  0.3 mg BID, Hydralazine  100 mg 3 times daily, and Imdur  60 mg daily Encourage to continue to monitor BP at home.  Will defer diuretic to Nephrology given her CKD.   Hyperlipidemia LDL goal <70 11/2023 LDL 59 Managed by her PCP  Continue on Crestor  5 mg daily.   CKD (chronic kidney disease) stage 4, GFR 15-29 ml/min (HCC) 02/2024 Cr 2.79 Followed by Dr. Rachele. Upcoming appt on 7/24. Defer diuretic management to nephorology.   Chronic Anemia  Recent hospitalization in 02/2024 with Hgb 8.5 >7.9 Order CBC.  Reviewed lab:  Hgb 9.3 (vs 7.9 on 03/12/2024). Improvement in Hemoglobin, RBC and HCT.     Dispo: Follow up in 1 month   Signed, Lorette CINDERELLA Kapur, PA-C

## 2024-03-31 NOTE — Telephone Encounter (Signed)
 Per Elida she reached out to Dr Rachele per Dr Kit request

## 2024-04-01 ENCOUNTER — Other Ambulatory Visit (HOSPITAL_COMMUNITY)
Admission: RE | Admit: 2024-04-01 | Discharge: 2024-04-01 | Disposition: A | Discharge status: Home / Self Care | Source: Ambulatory Visit | Attending: Physician Assistant | Admitting: Physician Assistant

## 2024-04-01 ENCOUNTER — Encounter: Payer: Self-pay | Admitting: Physician Assistant

## 2024-04-01 ENCOUNTER — Ambulatory Visit: Payer: Self-pay | Admitting: Physician Assistant

## 2024-04-01 ENCOUNTER — Ambulatory Visit: Attending: Student | Admitting: Physician Assistant

## 2024-04-01 VITALS — BP 160/78 | HR 60 | Ht 60.0 in | Wt 131.0 lb

## 2024-04-01 DIAGNOSIS — N184 Chronic kidney disease, stage 4 (severe): Secondary | ICD-10-CM

## 2024-04-01 DIAGNOSIS — E785 Hyperlipidemia, unspecified: Secondary | ICD-10-CM

## 2024-04-01 DIAGNOSIS — I38 Endocarditis, valve unspecified: Secondary | ICD-10-CM

## 2024-04-01 DIAGNOSIS — I10 Essential (primary) hypertension: Secondary | ICD-10-CM | POA: Diagnosis not present

## 2024-04-01 DIAGNOSIS — Z79899 Other long term (current) drug therapy: Secondary | ICD-10-CM | POA: Insufficient documentation

## 2024-04-01 DIAGNOSIS — D631 Anemia in chronic kidney disease: Secondary | ICD-10-CM | POA: Diagnosis not present

## 2024-04-01 DIAGNOSIS — I5032 Chronic diastolic (congestive) heart failure: Secondary | ICD-10-CM | POA: Diagnosis not present

## 2024-04-01 LAB — CBC
HCT: 27.2 % — ABNORMAL LOW (ref 36.0–46.0)
Hemoglobin: 9.3 g/dL — ABNORMAL LOW (ref 12.0–15.0)
MCH: 31.8 pg (ref 26.0–34.0)
MCHC: 34.2 g/dL (ref 30.0–36.0)
MCV: 93.2 fL (ref 80.0–100.0)
Platelets: 173 K/uL (ref 150–400)
RBC: 2.92 MIL/uL — ABNORMAL LOW (ref 3.87–5.11)
RDW: 13.6 % (ref 11.5–15.5)
WBC: 4.1 K/uL (ref 4.0–10.5)
nRBC: 0 % (ref 0.0–0.2)

## 2024-04-01 LAB — BASIC METABOLIC PANEL WITH GFR
Anion gap: 12 (ref 5–15)
BUN: 46 mg/dL — ABNORMAL HIGH (ref 8–23)
CO2: 29 mmol/L (ref 22–32)
Calcium: 8.7 mg/dL — ABNORMAL LOW (ref 8.9–10.3)
Chloride: 86 mmol/L — ABNORMAL LOW (ref 98–111)
Creatinine, Ser: 2.53 mg/dL — ABNORMAL HIGH (ref 0.44–1.00)
GFR, Estimated: 18 mL/min — ABNORMAL LOW (ref >60)
Glucose, Bld: 165 mg/dL — ABNORMAL HIGH (ref 70–99)
Potassium: 3.8 mmol/L (ref 3.5–5.1)
Sodium: 127 mmol/L — ABNORMAL LOW (ref 135–145)

## 2024-04-01 LAB — BRAIN NATRIURETIC PEPTIDE: B Natriuretic Peptide: 839 pg/mL — ABNORMAL HIGH (ref 0.0–100.0)

## 2024-04-01 MED ORDER — TORSEMIDE 20 MG PO TABS: ORAL_TABLET | ORAL | 11 refills | Status: DC

## 2024-04-01 MED ORDER — NIFEDIPINE ER OSMOTIC RELEASE 60 MG PO TB24: 60.0000 mg | ORAL_TABLET | Freq: Every day | ORAL | 3 refills | Status: DC

## 2024-04-01 NOTE — Patient Instructions (Signed)
 Medication Instructions:  Stop taking Norvasc   Start Nifedipine  60 mg Daily  Take Torsemide  60 mg in the Morning and 40 the Evening    *If you need a refill on your cardiac medications before your next appointment, please call your pharmacy*  Lab Work: Your physician recommends that you return for lab work in: Today ( CBC, BMET, BNP)   If you have labs (blood work) drawn today and your tests are completely normal, you will receive your results only by: MyChart Message (if you have MyChart) OR A paper copy in the mail If you have any lab test that is abnormal or we need to change your treatment, we will call you to review the results.  Testing/Procedures: NONE   Follow-Up: At [facility], you and your health needs are our priority.  As part of our continuing mission to provide you with exceptional heart care, our providers are all part of one team.  This team includes your primary Cardiologist (physician) and Advanced Practice Providers or APPs (Physician Assistants and Nurse Practitioners) who all work together to provide you with the care you need, when you need it.  Your next appointment:   6 -8  week(s)  Provider:   You may see Alvan Carrier, MD or one of the following Advanced Practice Providers on your designated Care Team:   [name], PA-C  [place], [place] Olivia [place], [place]     We recommend signing up for the patient portal called MyChart.  Sign up information is provided on this After Visit Summary.  MyChart is used to connect with patients for Virtual Visits (Telemedicine).  Patients are able to view lab/test results, encounter notes, upcoming appointments, etc.  Non-urgent messages can be sent to your provider as well.   To learn more about what you can do with MyChart, go to [email or web address].   Other Instructions Thank you for choosing [facility]!

## 2024-04-02 ENCOUNTER — Other Ambulatory Visit: Payer: Self-pay

## 2024-04-02 DIAGNOSIS — E1122 Type 2 diabetes mellitus with diabetic chronic kidney disease: Secondary | ICD-10-CM | POA: Diagnosis not present

## 2024-04-02 DIAGNOSIS — R1310 Dysphagia, unspecified: Secondary | ICD-10-CM | POA: Diagnosis not present

## 2024-04-02 DIAGNOSIS — E876 Hypokalemia: Secondary | ICD-10-CM | POA: Diagnosis not present

## 2024-04-02 DIAGNOSIS — D631 Anemia in chronic kidney disease: Secondary | ICD-10-CM | POA: Diagnosis not present

## 2024-04-02 DIAGNOSIS — G4700 Insomnia, unspecified: Secondary | ICD-10-CM | POA: Diagnosis not present

## 2024-04-02 DIAGNOSIS — I5033 Acute on chronic diastolic (congestive) heart failure: Secondary | ICD-10-CM | POA: Diagnosis not present

## 2024-04-02 DIAGNOSIS — I4729 Other ventricular tachycardia: Secondary | ICD-10-CM | POA: Diagnosis not present

## 2024-04-02 DIAGNOSIS — Z794 Long term (current) use of insulin: Secondary | ICD-10-CM | POA: Diagnosis not present

## 2024-04-02 DIAGNOSIS — Z86000 Personal history of in-situ neoplasm of breast: Secondary | ICD-10-CM | POA: Diagnosis not present

## 2024-04-02 DIAGNOSIS — J9811 Atelectasis: Secondary | ICD-10-CM | POA: Diagnosis not present

## 2024-04-02 DIAGNOSIS — E871 Hypo-osmolality and hyponatremia: Secondary | ICD-10-CM | POA: Diagnosis not present

## 2024-04-02 DIAGNOSIS — I110 Hypertensive heart disease with heart failure: Secondary | ICD-10-CM | POA: Diagnosis not present

## 2024-04-02 DIAGNOSIS — M810 Age-related osteoporosis without current pathological fracture: Secondary | ICD-10-CM | POA: Diagnosis not present

## 2024-04-02 DIAGNOSIS — Z9181 History of falling: Secondary | ICD-10-CM | POA: Diagnosis not present

## 2024-04-02 DIAGNOSIS — N184 Chronic kidney disease, stage 4 (severe): Secondary | ICD-10-CM | POA: Diagnosis not present

## 2024-04-02 DIAGNOSIS — E861 Hypovolemia: Secondary | ICD-10-CM | POA: Diagnosis not present

## 2024-04-02 DIAGNOSIS — I2510 Atherosclerotic heart disease of native coronary artery without angina pectoris: Secondary | ICD-10-CM | POA: Diagnosis not present

## 2024-04-02 DIAGNOSIS — Z79899 Other long term (current) drug therapy: Secondary | ICD-10-CM

## 2024-04-02 DIAGNOSIS — K219 Gastro-esophageal reflux disease without esophagitis: Secondary | ICD-10-CM | POA: Diagnosis not present

## 2024-04-04 DIAGNOSIS — Z9181 History of falling: Secondary | ICD-10-CM | POA: Diagnosis not present

## 2024-04-04 DIAGNOSIS — I2510 Atherosclerotic heart disease of native coronary artery without angina pectoris: Secondary | ICD-10-CM | POA: Diagnosis not present

## 2024-04-04 DIAGNOSIS — N184 Chronic kidney disease, stage 4 (severe): Secondary | ICD-10-CM | POA: Diagnosis not present

## 2024-04-04 DIAGNOSIS — I110 Hypertensive heart disease with heart failure: Secondary | ICD-10-CM | POA: Diagnosis not present

## 2024-04-04 DIAGNOSIS — R1310 Dysphagia, unspecified: Secondary | ICD-10-CM | POA: Diagnosis not present

## 2024-04-04 DIAGNOSIS — E871 Hypo-osmolality and hyponatremia: Secondary | ICD-10-CM | POA: Diagnosis not present

## 2024-04-04 DIAGNOSIS — Z794 Long term (current) use of insulin: Secondary | ICD-10-CM | POA: Diagnosis not present

## 2024-04-04 DIAGNOSIS — I4729 Other ventricular tachycardia: Secondary | ICD-10-CM | POA: Diagnosis not present

## 2024-04-04 DIAGNOSIS — J9811 Atelectasis: Secondary | ICD-10-CM | POA: Diagnosis not present

## 2024-04-04 DIAGNOSIS — K219 Gastro-esophageal reflux disease without esophagitis: Secondary | ICD-10-CM | POA: Diagnosis not present

## 2024-04-04 DIAGNOSIS — E861 Hypovolemia: Secondary | ICD-10-CM | POA: Diagnosis not present

## 2024-04-04 DIAGNOSIS — E876 Hypokalemia: Secondary | ICD-10-CM | POA: Diagnosis not present

## 2024-04-04 DIAGNOSIS — D631 Anemia in chronic kidney disease: Secondary | ICD-10-CM | POA: Diagnosis not present

## 2024-04-04 DIAGNOSIS — Z86000 Personal history of in-situ neoplasm of breast: Secondary | ICD-10-CM | POA: Diagnosis not present

## 2024-04-04 DIAGNOSIS — I5033 Acute on chronic diastolic (congestive) heart failure: Secondary | ICD-10-CM | POA: Diagnosis not present

## 2024-04-04 DIAGNOSIS — E1122 Type 2 diabetes mellitus with diabetic chronic kidney disease: Secondary | ICD-10-CM | POA: Diagnosis not present

## 2024-04-04 DIAGNOSIS — M810 Age-related osteoporosis without current pathological fracture: Secondary | ICD-10-CM | POA: Diagnosis not present

## 2024-04-04 DIAGNOSIS — G4700 Insomnia, unspecified: Secondary | ICD-10-CM | POA: Diagnosis not present

## 2024-04-07 ENCOUNTER — Emergency Department (HOSPITAL_COMMUNITY)

## 2024-04-07 ENCOUNTER — Inpatient Hospital Stay (HOSPITAL_COMMUNITY)
Admission: EM | Admit: 2024-04-07 | Discharge: 2024-04-21 | DRG: 291 | Disposition: A | Discharge status: Home Health | Attending: Family Medicine | Admitting: Family Medicine

## 2024-04-07 ENCOUNTER — Other Ambulatory Visit: Payer: Self-pay

## 2024-04-07 ENCOUNTER — Ambulatory Visit: Payer: Self-pay

## 2024-04-07 ENCOUNTER — Encounter (HOSPITAL_COMMUNITY): Payer: Self-pay | Admitting: *Deleted

## 2024-04-07 DIAGNOSIS — D631 Anemia in chronic kidney disease: Secondary | ICD-10-CM | POA: Diagnosis not present

## 2024-04-07 DIAGNOSIS — I129 Hypertensive chronic kidney disease with stage 1 through stage 4 chronic kidney disease, or unspecified chronic kidney disease: Secondary | ICD-10-CM | POA: Diagnosis not present

## 2024-04-07 DIAGNOSIS — I69198 Other sequelae of nontraumatic intracerebral hemorrhage: Secondary | ICD-10-CM | POA: Diagnosis not present

## 2024-04-07 DIAGNOSIS — E861 Hypovolemia: Secondary | ICD-10-CM | POA: Diagnosis not present

## 2024-04-07 DIAGNOSIS — I959 Hypotension, unspecified: Secondary | ICD-10-CM | POA: Diagnosis not present

## 2024-04-07 DIAGNOSIS — K5900 Constipation, unspecified: Secondary | ICD-10-CM | POA: Diagnosis present

## 2024-04-07 DIAGNOSIS — Z6826 Body mass index (BMI) 26.0-26.9, adult: Secondary | ICD-10-CM

## 2024-04-07 DIAGNOSIS — Z823 Family history of stroke: Secondary | ICD-10-CM

## 2024-04-07 DIAGNOSIS — I10 Essential (primary) hypertension: Secondary | ICD-10-CM | POA: Diagnosis not present

## 2024-04-07 DIAGNOSIS — Z83438 Family history of other disorder of lipoprotein metabolism and other lipidemia: Secondary | ICD-10-CM

## 2024-04-07 DIAGNOSIS — T68XXXA Hypothermia, initial encounter: Secondary | ICD-10-CM | POA: Diagnosis not present

## 2024-04-07 DIAGNOSIS — Z86000 Personal history of in-situ neoplasm of breast: Secondary | ICD-10-CM | POA: Diagnosis not present

## 2024-04-07 DIAGNOSIS — E785 Hyperlipidemia, unspecified: Secondary | ICD-10-CM | POA: Diagnosis present

## 2024-04-07 DIAGNOSIS — R001 Bradycardia, unspecified: Secondary | ICD-10-CM | POA: Diagnosis not present

## 2024-04-07 DIAGNOSIS — I130 Hypertensive heart and chronic kidney disease with heart failure and stage 1 through stage 4 chronic kidney disease, or unspecified chronic kidney disease: Secondary | ICD-10-CM | POA: Diagnosis not present

## 2024-04-07 DIAGNOSIS — I160 Hypertensive urgency: Secondary | ICD-10-CM | POA: Diagnosis not present

## 2024-04-07 DIAGNOSIS — E1110 Type 2 diabetes mellitus with ketoacidosis without coma: Secondary | ICD-10-CM | POA: Diagnosis not present

## 2024-04-07 DIAGNOSIS — I2510 Atherosclerotic heart disease of native coronary artery without angina pectoris: Secondary | ICD-10-CM | POA: Diagnosis present

## 2024-04-07 DIAGNOSIS — E8779 Other fluid overload: Secondary | ICD-10-CM | POA: Diagnosis not present

## 2024-04-07 DIAGNOSIS — D649 Anemia, unspecified: Secondary | ICD-10-CM | POA: Diagnosis not present

## 2024-04-07 DIAGNOSIS — J9811 Atelectasis: Secondary | ICD-10-CM | POA: Diagnosis not present

## 2024-04-07 DIAGNOSIS — G9349 Other encephalopathy: Secondary | ICD-10-CM | POA: Diagnosis present

## 2024-04-07 DIAGNOSIS — G319 Degenerative disease of nervous system, unspecified: Secondary | ICD-10-CM | POA: Diagnosis not present

## 2024-04-07 DIAGNOSIS — I6782 Cerebral ischemia: Secondary | ICD-10-CM | POA: Diagnosis not present

## 2024-04-07 DIAGNOSIS — Z794 Long term (current) use of insulin: Secondary | ICD-10-CM

## 2024-04-07 DIAGNOSIS — I110 Hypertensive heart disease with heart failure: Secondary | ICD-10-CM | POA: Diagnosis not present

## 2024-04-07 DIAGNOSIS — N184 Chronic kidney disease, stage 4 (severe): Secondary | ICD-10-CM | POA: Diagnosis present

## 2024-04-07 DIAGNOSIS — R0602 Shortness of breath: Secondary | ICD-10-CM | POA: Diagnosis not present

## 2024-04-07 DIAGNOSIS — I169 Hypertensive crisis, unspecified: Secondary | ICD-10-CM | POA: Diagnosis not present

## 2024-04-07 DIAGNOSIS — E11649 Type 2 diabetes mellitus with hypoglycemia without coma: Secondary | ICD-10-CM | POA: Diagnosis not present

## 2024-04-07 DIAGNOSIS — S31000S Unspecified open wound of lower back and pelvis without penetration into retroperitoneum, sequela: Secondary | ICD-10-CM

## 2024-04-07 DIAGNOSIS — Z800 Family history of malignant neoplasm of digestive organs: Secondary | ICD-10-CM

## 2024-04-07 DIAGNOSIS — Z833 Family history of diabetes mellitus: Secondary | ICD-10-CM

## 2024-04-07 DIAGNOSIS — E1322 Other specified diabetes mellitus with diabetic chronic kidney disease: Secondary | ICD-10-CM | POA: Diagnosis not present

## 2024-04-07 DIAGNOSIS — R1310 Dysphagia, unspecified: Secondary | ICD-10-CM | POA: Diagnosis not present

## 2024-04-07 DIAGNOSIS — N189 Chronic kidney disease, unspecified: Secondary | ICD-10-CM | POA: Diagnosis not present

## 2024-04-07 DIAGNOSIS — E1169 Type 2 diabetes mellitus with other specified complication: Secondary | ICD-10-CM | POA: Diagnosis present

## 2024-04-07 DIAGNOSIS — Z79899 Other long term (current) drug therapy: Secondary | ICD-10-CM

## 2024-04-07 DIAGNOSIS — I5033 Acute on chronic diastolic (congestive) heart failure: Secondary | ICD-10-CM | POA: Diagnosis not present

## 2024-04-07 DIAGNOSIS — I1A0 Resistant hypertension: Secondary | ICD-10-CM | POA: Diagnosis present

## 2024-04-07 DIAGNOSIS — M810 Age-related osteoporosis without current pathological fracture: Secondary | ICD-10-CM | POA: Diagnosis not present

## 2024-04-07 DIAGNOSIS — I517 Cardiomegaly: Secondary | ICD-10-CM | POA: Diagnosis not present

## 2024-04-07 DIAGNOSIS — I509 Heart failure, unspecified: Principal | ICD-10-CM

## 2024-04-07 DIAGNOSIS — L89150 Pressure ulcer of sacral region, unstageable: Secondary | ICD-10-CM | POA: Diagnosis not present

## 2024-04-07 DIAGNOSIS — Z8249 Family history of ischemic heart disease and other diseases of the circulatory system: Secondary | ICD-10-CM

## 2024-04-07 DIAGNOSIS — N179 Acute kidney failure, unspecified: Secondary | ICD-10-CM | POA: Diagnosis not present

## 2024-04-07 DIAGNOSIS — E876 Hypokalemia: Secondary | ICD-10-CM | POA: Diagnosis present

## 2024-04-07 DIAGNOSIS — K219 Gastro-esophageal reflux disease without esophagitis: Secondary | ICD-10-CM | POA: Diagnosis present

## 2024-04-07 DIAGNOSIS — E1122 Type 2 diabetes mellitus with diabetic chronic kidney disease: Secondary | ICD-10-CM | POA: Diagnosis not present

## 2024-04-07 DIAGNOSIS — R54 Age-related physical debility: Secondary | ICD-10-CM | POA: Diagnosis present

## 2024-04-07 DIAGNOSIS — E874 Mixed disorder of acid-base balance: Secondary | ICD-10-CM | POA: Diagnosis not present

## 2024-04-07 DIAGNOSIS — I350 Nonrheumatic aortic (valve) stenosis: Secondary | ICD-10-CM | POA: Diagnosis present

## 2024-04-07 DIAGNOSIS — Z7984 Long term (current) use of oral hypoglycemic drugs: Secondary | ICD-10-CM

## 2024-04-07 DIAGNOSIS — Z825 Family history of asthma and other chronic lower respiratory diseases: Secondary | ICD-10-CM

## 2024-04-07 DIAGNOSIS — E119 Type 2 diabetes mellitus without complications: Secondary | ICD-10-CM

## 2024-04-07 DIAGNOSIS — Z66 Do not resuscitate: Secondary | ICD-10-CM | POA: Diagnosis present

## 2024-04-07 DIAGNOSIS — N183 Chronic kidney disease, stage 3 unspecified: Secondary | ICD-10-CM | POA: Diagnosis not present

## 2024-04-07 DIAGNOSIS — R680 Hypothermia, not associated with low environmental temperature: Secondary | ICD-10-CM | POA: Diagnosis present

## 2024-04-07 DIAGNOSIS — E871 Hypo-osmolality and hyponatremia: Secondary | ICD-10-CM | POA: Diagnosis present

## 2024-04-07 DIAGNOSIS — I614 Nontraumatic intracerebral hemorrhage in cerebellum: Secondary | ICD-10-CM | POA: Diagnosis not present

## 2024-04-07 DIAGNOSIS — Z885 Allergy status to narcotic agent status: Secondary | ICD-10-CM

## 2024-04-07 DIAGNOSIS — F411 Generalized anxiety disorder: Secondary | ICD-10-CM | POA: Diagnosis present

## 2024-04-07 DIAGNOSIS — I4729 Other ventricular tachycardia: Secondary | ICD-10-CM | POA: Diagnosis not present

## 2024-04-07 DIAGNOSIS — Z9181 History of falling: Secondary | ICD-10-CM | POA: Diagnosis not present

## 2024-04-07 DIAGNOSIS — J90 Pleural effusion, not elsewhere classified: Secondary | ICD-10-CM | POA: Diagnosis not present

## 2024-04-07 DIAGNOSIS — G4700 Insomnia, unspecified: Secondary | ICD-10-CM | POA: Diagnosis not present

## 2024-04-07 DIAGNOSIS — Z7189 Other specified counseling: Secondary | ICD-10-CM | POA: Diagnosis not present

## 2024-04-07 DIAGNOSIS — Z515 Encounter for palliative care: Secondary | ICD-10-CM | POA: Diagnosis not present

## 2024-04-07 DIAGNOSIS — Z888 Allergy status to other drugs, medicaments and biological substances status: Secondary | ICD-10-CM

## 2024-04-07 DIAGNOSIS — Z91158 Patient's noncompliance with renal dialysis for other reason: Secondary | ICD-10-CM

## 2024-04-07 LAB — COMPREHENSIVE METABOLIC PANEL WITH GFR
ALT: 15 U/L (ref 0–44)
AST: 23 U/L (ref 15–41)
Albumin: 2.9 g/dL — ABNORMAL LOW (ref 3.5–5.0)
Alkaline Phosphatase: 74 U/L (ref 38–126)
Anion gap: 10 (ref 5–15)
BUN: 51 mg/dL — ABNORMAL HIGH (ref 8–23)
CO2: 27 mmol/L (ref 22–32)
Calcium: 8.2 mg/dL — ABNORMAL LOW (ref 8.9–10.3)
Chloride: 82 mmol/L — ABNORMAL LOW (ref 98–111)
Creatinine, Ser: 2.66 mg/dL — ABNORMAL HIGH (ref 0.44–1.00)
GFR, Estimated: 17 mL/min — ABNORMAL LOW (ref >60)
Glucose, Bld: 162 mg/dL — ABNORMAL HIGH (ref 70–99)
Potassium: 4.1 mmol/L (ref 3.5–5.1)
Sodium: 119 mmol/L — CL (ref 135–145)
Total Bilirubin: 0.7 mg/dL (ref 0.0–1.2)
Total Protein: 5.2 g/dL — ABNORMAL LOW (ref 6.5–8.1)

## 2024-04-07 LAB — CBC WITH DIFFERENTIAL/PLATELET
Abs Immature Granulocytes: 0.01 K/uL (ref 0.00–0.07)
Basophils Absolute: 0 K/uL (ref 0.0–0.1)
Basophils Relative: 0 %
Eosinophils Absolute: 0 K/uL (ref 0.0–0.5)
Eosinophils Relative: 1 %
HCT: 24.7 % — ABNORMAL LOW (ref 36.0–46.0)
Hemoglobin: 8.8 g/dL — ABNORMAL LOW (ref 12.0–15.0)
Immature Granulocytes: 0 %
Lymphocytes Relative: 12 %
Lymphs Abs: 0.4 K/uL — ABNORMAL LOW (ref 0.7–4.0)
MCH: 32.2 pg (ref 26.0–34.0)
MCHC: 35.6 g/dL (ref 30.0–36.0)
MCV: 90.5 fL (ref 80.0–100.0)
Monocytes Absolute: 0.3 K/uL (ref 0.1–1.0)
Monocytes Relative: 9 %
Neutro Abs: 2.4 K/uL (ref 1.7–7.7)
Neutrophils Relative %: 78 %
Platelets: 163 K/uL (ref 150–400)
RBC: 2.73 MIL/uL — ABNORMAL LOW (ref 3.87–5.11)
RDW: 13.4 % (ref 11.5–15.5)
WBC: 3.1 K/uL — ABNORMAL LOW (ref 4.0–10.5)
nRBC: 0 % (ref 0.0–0.2)

## 2024-04-07 LAB — GLUCOSE, CAPILLARY
Glucose-Capillary: 204 mg/dL — ABNORMAL HIGH (ref 70–99)
Glucose-Capillary: 284 mg/dL — ABNORMAL HIGH (ref 70–99)

## 2024-04-07 LAB — TROPONIN I (HIGH SENSITIVITY)
Troponin I (High Sensitivity): 6 ng/L (ref <18)
Troponin I (High Sensitivity): 7 ng/L (ref <18)

## 2024-04-07 LAB — BRAIN NATRIURETIC PEPTIDE: B Natriuretic Peptide: 962 pg/mL — ABNORMAL HIGH (ref 0.0–100.0)

## 2024-04-07 LAB — MRSA NEXT GEN BY PCR, NASAL: MRSA by PCR Next Gen: NOT DETECTED

## 2024-04-07 MED ORDER — ISOSORBIDE MONONITRATE ER 60 MG PO TB24
60.0000 mg | ORAL_TABLET | Freq: Every day | ORAL | Status: DC
  Administered 2024-04-08 – 2024-04-13 (×6): 60 mg via ORAL
  Filled 2024-04-07 (×6): qty 1

## 2024-04-07 MED ORDER — HYDRALAZINE HCL 50 MG PO TABS
100.0000 mg | ORAL_TABLET | Freq: Three times a day (TID) | ORAL | Status: DC
  Administered 2024-04-08 – 2024-04-13 (×18): 100 mg via ORAL
  Filled 2024-04-07 (×18): qty 2

## 2024-04-07 MED ORDER — FUROSEMIDE 10 MG/ML IJ SOLN
60.0000 mg | Freq: Once | INTRAMUSCULAR | Status: AC
  Administered 2024-04-07: 60 mg via INTRAVENOUS
  Filled 2024-04-07: qty 6

## 2024-04-07 MED ORDER — INSULIN GLARGINE-YFGN 100 UNIT/ML ~~LOC~~ SOLN
6.0000 [IU] | Freq: Every day | SUBCUTANEOUS | Status: DC
  Administered 2024-04-08 – 2024-04-15 (×8): 6 [IU] via SUBCUTANEOUS
  Filled 2024-04-07 (×9): qty 0.06

## 2024-04-07 MED ORDER — CARVEDILOL 12.5 MG PO TABS: 12.5000 mg | ORAL_TABLET | Freq: Two times a day (BID) | ORAL | Status: DC

## 2024-04-07 MED ORDER — NIFEDIPINE ER OSMOTIC RELEASE 30 MG PO TB24
60.0000 mg | ORAL_TABLET | Freq: Every day | ORAL | Status: DC
  Administered 2024-04-08: 60 mg via ORAL
  Filled 2024-04-07 (×2): qty 2

## 2024-04-07 MED ORDER — INSULIN ASPART 100 UNIT/ML IJ SOLN
0.0000 [IU] | Freq: Every day | INTRAMUSCULAR | Status: DC
  Administered 2024-04-07: 3 [IU] via SUBCUTANEOUS
  Administered 2024-04-10 – 2024-04-14 (×4): 2 [IU] via SUBCUTANEOUS
  Administered 2024-04-15: 3 [IU] via SUBCUTANEOUS

## 2024-04-07 MED ORDER — INSULIN DEGLUDEC 200 UNIT/ML ~~LOC~~ SOPN: 6.0000 [IU] | PEN_INJECTOR | Freq: Every day | SUBCUTANEOUS | Status: DC

## 2024-04-07 MED ORDER — CLONIDINE HCL 0.2 MG PO TABS
0.3000 mg | ORAL_TABLET | Freq: Two times a day (BID) | ORAL | Status: DC
  Administered 2024-04-08: 0.3 mg via ORAL
  Filled 2024-04-07: qty 1

## 2024-04-07 MED ORDER — INSULIN ASPART 100 UNIT/ML IJ SOLN
0.0000 [IU] | Freq: Three times a day (TID) | INTRAMUSCULAR | Status: DC
  Administered 2024-04-08: 2 [IU] via SUBCUTANEOUS
  Administered 2024-04-08: 1 [IU] via SUBCUTANEOUS
  Administered 2024-04-09: 2 [IU] via SUBCUTANEOUS
  Administered 2024-04-09: 3 [IU] via SUBCUTANEOUS
  Administered 2024-04-10 – 2024-04-11 (×3): 2 [IU] via SUBCUTANEOUS
  Administered 2024-04-11: 3 [IU] via SUBCUTANEOUS
  Administered 2024-04-11: 2 [IU] via SUBCUTANEOUS
  Administered 2024-04-12: 1 [IU] via SUBCUTANEOUS
  Administered 2024-04-12 – 2024-04-13 (×2): 2 [IU] via SUBCUTANEOUS
  Administered 2024-04-13 – 2024-04-14 (×2): 1 [IU] via SUBCUTANEOUS
  Administered 2024-04-14: 3 [IU] via SUBCUTANEOUS
  Administered 2024-04-14: 5 [IU] via SUBCUTANEOUS

## 2024-04-07 NOTE — H&P (Signed)
 History and Physical    Emily Jennings FMW:[id number] DOB: [birth year] DOA: 04/07/2024  PCP: Antonetta Rollene BRAVO, MD   Patient coming from: Home  I have personally briefly reviewed patient's old medical records in [facility] Link  Chief Complaint: Leg swelling and weight gain  HPI: Emily Jennings is a 89 y.o. female with medical history significant for CHF, diabetes mellitus, hypertension, CKD 4. Patient presented to the ED with complaints of difficulty breathing over the past 2 days, swelling to bilateral lower extremities up to her thighs, swelling of her arms.  She reports weight gain from 128-135 today.  She reports compliance with her medication-torsemide  60/40mg . Torsemide  dose was increased 7/15.  Recent hospitalizations for same-most recent 6/23 to 6/25.  ED Course: Temperature 92.8.  Heart rate 56-64.  Respirate rate 11-25.  Blood pressure systolic 143-174.  O2 sat greater than 95% on room air.  BNP 962.  Troponin 6 > 7. Chest x-ray small bilateral pleural effusions. Lasix  60 mg x 1 given  Review of Systems: As per HPI all other systems reviewed and negative.  Past Medical History:  Diagnosis Date   Allergy    Anxiety    ANXIETY DISORDER, GENERALIZED 07/15/2007   Qualifier: Diagnosis of  By: Aisha Motes     Arthritis    Cancer [facility]) 2009   breast, carcinoma in situ left   Carcinoma in situ of breast 05/21/2008   Qualifier: Diagnosis of  By: Antonetta MD, Margaret  Diagnosed in 2009, completed 5 year course of tamoxifen, no evidence of recurrence    Carotid stenosis    11/16/2005  mild plaque formation and stenosis proximal right ECA   Cataract    Complication of anesthesia    Coronary artery disease    cardiac catheterization on 03/20/2006  LAD mid 40% stenosis, left circumflex mild 40% stenosis, RCA mid-vessel 40% to 50% lesion   EF 60%   Diabetes mellitus    GERD (gastroesophageal reflux disease)    Hernia, inguinal    left   Hyperglycemia    Hypertension     Insomnia 11/16/2011   Low blood potassium    Non-insulin  dependent type 2 diabetes mellitus (HCC)    Osteoporosis    Shortness of breath    2D Echocardiogram 01/26/2009   EF of greater than 55%, mild MR, mild TR, normal ventricular function   Thickened endometrium 10/26/2017   Noted by gyne in 2017, missed 6 month follow up, referred in 09/2017   Ventricular tachycardia, non-sustained (HCC)    developed during stress test 02/08/2006, spontaneously aborted, mild reversible apical defect    Past Surgical History:  Procedure Laterality Date   BOWEL RESECTION Left 07/19/2023   Procedure: SMALL BOWEL RESECTION;  Surgeon: Kallie Manuelita BROCKS, MD;  Location: [facility];  Service: General;  Laterality: Left;   BREAST LUMPECTOMY Left 2009   Left breast 2009   CATARACT EXTRACTION W/PHACO Left 10/28/2014   Procedure: PHACO EMULSION CATARACT EXTRACTION WITH INTRAOCULAR LENS IMPLANT LEFT EYE (IOC);  Surgeon: Gaither Quan, MD;  Location: [facility] OR;  Service: Ophthalmology;  Laterality: Left;   COLONOSCOPY     cyst removed from left foot     INGUINAL HERNIA REPAIR Left 07/19/2023   Procedure: HERNIA REPAIR FEMORAL INCARCERATED;  Surgeon: Kallie Manuelita BROCKS, MD;  Location: [facility] ORS;  Service: General;  Laterality: Left;   REFRACTIVE SURGERY Left      reports that she has never smoked. She has never been exposed to tobacco smoke. She has  never used smokeless tobacco. She reports that she does not drink alcohol and does not use drugs.  Allergies  Allergen Reactions   Farxiga [Dapagliflozin] Other (See Comments)    DKA   Metformin  And Related Diarrhea    Lost appetite and weight    Benadryl [Diphenhydramine Hcl] Hypertension   Citalopram Other (See Comments)    Unknown    Tramadol  Other (See Comments)    Felt light headed and dizzy   Amlodipine  Swelling   Crestor  [Rosuvastatin ] Swelling and Other (See Comments)    Feet swelling Makes her feel weak. Pt takes 5mg  at home    Family History  Problem  Relation Age of Onset   Hypertension Mother    Hyperlipidemia Mother    Stroke Mother    Urticaria Mother    Cancer Father        pancreatic   Colon cancer Father    Heart disease Brother 40       bypass   Heart disease Brother 45       bypass   Arthritis Other    Asthma Other    Diabetes Other    Colon cancer Paternal Aunt    Esophageal cancer Neg Hx    Stomach cancer Neg Hx    Rectal cancer Neg Hx    Prior to Admission medications   Medication Sig Start Date End Date Taking? Authorizing Provider  albuterol  (VENTOLIN  HFA) 108 (90 Base) MCG/ACT inhaler Inhale 2 puffs into the lungs every 2 (two) hours as needed for wheezing or shortness of breath. 02/13/24   Pearlean Manus, MD  carvedilol  (COREG ) 12.5 MG tablet Take 1 tablet (12.5 mg total) by mouth 2 (two) times daily with a meal. 02/13/24   Shanece Cochrane, Courage, MD  cloNIDine  (CATAPRES ) 0.3 MG tablet Take 1 tablet (0.3 mg total) by mouth 2 (two) times daily. 02/13/24   Pearlean Manus, MD  Continuous Glucose Receiver (DEXCOM G7 RECEIVER) DEVI Use as directed 03/20/24   Antonetta Rollene BRAVO, MD  Continuous Glucose Sensor (DEXCOM G7 SENSOR) MISC Use 1 every 10 days 03/20/24   Antonetta Rollene BRAVO, MD  hydrALAZINE  (APRESOLINE ) 100 MG tablet Take 1 tablet (100 mg total) by mouth 3 (three) times daily. 02/13/24   Pearlean Manus, MD  insulin  degludec (TRESIBA ) 200 UNIT/ML FlexTouch Pen Inject 6 Units into the skin daily at 10 pm. 02/13/24   Pearlean Manus, MD  insulin  lispro (HUMALOG  KWIKPEN) 100 UNIT/ML KwikPen Inject 0-12 Units into the skin 3 (three) times daily. Short acting Humalog  insulin  per sliding scale 0-12 ---:-- Insulin  injection 0-12 Units 0-12 Units Subcutaneous, 3 times daily with meals CBG 70 - 120: 0 unit CBG 121 - 150: 0 unit  CBG 151 - 200: 2 unit CBG 201 - 250: 4 units CBG 251 - 300: 6 units CBG 301 - 350: 8 units  CBG 351 - 400: 10 units  CBG > 400: 12 units 02/13/24   Calhoun Reichardt, Courage, MD  isosorbide  mononitrate (IMDUR ) 60 MG  24 hr tablet Take 1 tablet (60 mg total) by mouth daily. 02/13/24   Pearlean Manus, MD  magnesium  oxide (MAG-OX) 400 (240 Mg) MG tablet Take 1 tablet (400 mg total) by mouth daily. 02/13/24   Pearlean Manus, MD  NIFEdipine  (PROCARDIA  XL/NIFEDICAL XL) 60 MG 24 hr tablet Take 1 tablet (60 mg total) by mouth daily. 04/01/24   Sheron Lorette GRADE, PA-C  Potassium Chloride  ER 20 MEQ TBCR Take 1 tablet by mouth daily. 02/10/24   [provider]  RESTASIS  0.05 % ophthalmic emulsion Place 1 drop into both eyes 2 (two) times daily. 11/01/23   [provider]  rosuvastatin  (CRESTOR ) 5 MG tablet Take 1 tablet (5 mg total) by mouth daily. 02/13/24   Pearlean Manus, MD  torsemide  (DEMADEX ) 20 MG tablet Take 60 mg ( 3 Tablets) in the Morning and Take 40 mg ( 2 Tablets) in the Afternoon. 04/01/24   Sheron Lorette GRADE, PA-C    Physical Exam: Vitals:   04/07/24 1404 04/07/24 1430 04/07/24 1500 04/07/24 1600  BP: (!) 174/62 (!) 172/59 (!) 149/63 (!) 143/88  Pulse: 62 (!) 59 (!) 56 (!) 59  Resp: 16 18 11 18   SpO2: 97% 98% 97% 95%  Weight:      Height:        Constitutional: NAD, calm, comfortable Vitals:   04/07/24 1404 04/07/24 1430 04/07/24 1500 04/07/24 1600  BP: (!) 174/62 (!) 172/59 (!) 149/63 (!) 143/88  Pulse: 62 (!) 59 (!) 56 (!) 59  Resp: 16 18 11 18   SpO2: 97% 98% 97% 95%  Weight:      Height:       Eyes: PERRL, lids and conjunctivae normal ENMT: Mucous membranes are moist.  Neck: normal, supple, no masses, no thyromegaly Respiratory: clear to auscultation bilaterally, no wheezing, no crackles. Normal respiratory effort. No accessory muscle use.  Cardiovascular: Regular rate and rhythm, no murmurs / rubs / gallops.  2+ pitting bilateral lower extremity edema to the knees, pitting to the lateral aspect of thighs, swelling to upper extremities right more than left. Abdomen: no tenderness, no masses palpated. No hepatosplenomegaly.  Musculoskeletal: no clubbing / cyanosis. No  joint deformity upper and lower extremities.   Skin: no rashes, lesions, ulcers. No induration Neurologic:  No facial asymmetry, moving extremities spontaneously, speech fluent Psychiatric: Normal judgment and insight. Alert and oriented x 3. Normal mood.   Labs on Admission: I have personally reviewed following labs and imaging studies  CBC: Recent Labs  Lab 04/01/24 1548 04/07/24 1400  WBC 4.1 3.1*  NEUTROABS  --  2.4  HGB 9.3* 8.8*  HCT 27.2* 24.7*  MCV 93.2 90.5  PLT 173 163   Basic Metabolic Panel: Recent Labs  Lab 04/01/24 1548 04/07/24 1400  NA 127* 119*  K 3.8 4.1  CL 86* 82*  CO2 29 27  GLUCOSE 165* 162*  BUN 46* 51*  CREATININE 2.53* 2.66*  CALCIUM  8.7* 8.2*   GFR: Estimated Creatinine Clearance: 11.8 mL/min (A) (by C-G formula based on SCr of 2.66 mg/dL (H)). Liver Function Tests: Recent Labs  Lab 04/07/24 1400  AST 23  ALT 15  ALKPHOS 74  BILITOT 0.7  PROT 5.2*  ALBUMIN  2.9*   Urine analysis:    Component Value Date/Time   COLORURINE STRAW (A) 01/25/2024 0821   APPEARANCEUR CLEAR 01/25/2024 0821   LABSPEC 1.007 01/25/2024 0821   PHURINE 5.0 01/25/2024 0821   GLUCOSEU 150 (A) 01/25/2024 0821   HGBUR NEGATIVE 01/25/2024 0821   HGBUR trace-intact 04/28/2010 1259   BILIRUBINUR NEGATIVE 01/25/2024 0821   BILIRUBINUR neg 09/29/2013 1341   KETONESUR NEGATIVE 01/25/2024 0821   PROTEINUR >=300 (A) 01/25/2024 0821   UROBILINOGEN 0.2 09/29/2013 1341   UROBILINOGEN 0.2 12/28/2010 1743   NITRITE NEGATIVE 01/25/2024 0821   LEUKOCYTESUR NEGATIVE 01/25/2024 0821    Radiological Exams on Admission: DG Chest Port 1 View Result Date: 04/07/2024 CLINICAL DATA:  Shortness of breath for several days. EXAM: PORTABLE CHEST 1 VIEW COMPARISON:  03/10/2024 FINDINGS: Stable mild  cardiomegaly. Stable small bilateral pleural effusions. Both lungs are otherwise clear. IMPRESSION: Stable mild cardiomegaly and small bilateral pleural effusions. No new findings.  Electronically Signed   By: Norleen DELENA Kil M.D.   On: 04/07/2024 15:43    EKG: Independently reviewed.  Sinus rhythm, rate 58, QTc 441.  No significant change from prior.  Assessment/Plan Principal Problem:   Acute on chronic congestive heart failure with left ventricular diastolic dysfunction (HCC) Active Problems:   Hypothermia   Hyponatremia   Diabetes (HCC)   CKD stage 4 due to type 2 diabetes mellitus (HCC)   DNR (do not resuscitate)   Essential hypertension  Assessment and Plan:  Acute on chronic diastolic congestive heart failure - ~~16 pound weight gain per chart since discharge from the hospital, 2+ pitting bilateral lower extremity edema, edema to upper extremities. Chest x-ray -small bilateral pleural effusions.  Not hypoxic.  Elevated BNP 962, but she has had prior elevations to 1300s during prior hospitalizations for same.  Recent echo 01/2024, EF of 70 to 75%, G2DD.  Reports compliance with torsemide  60- a.m and 40 p.m, recently adjusted - IV Lasix  60 mg x 1 given in ED, continue 40 twice daily -Strict input output, daily weight, daily BMP   Hyponatremia-acute on chronic hyponatremia.  Down to 119 today, recent baseline 127-131.  Likely combination of hypervolemia and diuretics.  Hypothermia-rectal temp 92.6, vitals otherwise stable.  No signs or symptoms to suggest infectious etiology at this time.  WBC 3.1.  Recent TSH 5/21 normal at 3.08 -Apply warming blankets   CKD stage 4 due to type 2 diabetes mellitus (HCC) Creatinine 2.66, stable.   Type 2 diabetes mellitus with other specified complication (HCC) A1c 7.4 - Resume long-acting insulin  Tresiba  6 units nightly - SSI- S   Essential hypertension Elevated.  On multiple antihypertensive agents -Resume Norvasc , carvedilol  12.5 mg twice daily, clonidine  0.3 mg twice daily,  Imdur  - Pending stable blood pressure, resume hydralazine  100 mg 3 times daily in a.m.  DVT prophylaxis: Heparin  Code Status: DNR.  ACP  documents reviewed. Family Communication: Spouse at bedside Disposition Plan: ~ 2 days Consults called: None Admission status: Inpt Stepdown I certify that at the point of admission it is my clinical judgment that the patient will require inpatient hospital care spanning beyond 2 midnights from the point of admission due to high intensity of service, high risk for further deterioration and high frequency of surveillance required.  Author: Tully FORBES Carwin, MD 04/07/2024 8:16 PM  For on call review [email or web address].

## 2024-04-07 NOTE — Telephone Encounter (Signed)
 Call dropped prior to transfer - Agent called back both Pt and PT Alonso) both went to VM. Will place in call backs.           Copied from CRM [id number]. Topic: Clinical - Red Word Triage >> Apr 07, 2024 12:02 PM Abigail D wrote: Red Word that prompted transfer to Nurse Triage: High Blood Pressure/Having a hard time breathing: Oneil a PT with [facility] calling to speak with a nurse regarding high blood pressure and wanted to note significant weight gain, her weight is currently 135 and her bp is 180/90.  [phone] [place] Oneil PT

## 2024-04-07 NOTE — Telephone Encounter (Signed)
 Patient is at the ER.

## 2024-04-07 NOTE — ED Triage Notes (Signed)
 Pt c/o feeling sob for the last couple of days; pt denies any chest pain  Pt denies any pain

## 2024-04-07 NOTE — ED Provider Notes (Signed)
 [place] EMERGENCY DEPARTMENT AT [facility] Provider Note   CSN: [id number] Arrival date & time: 04/07/24  1330     Patient presents with: Shortness of Breath   Emily Jennings is a 89 y.o. female.   Patient is an 89 year old female who presents emergency department with a chief complaint of increased lower extremity edema, edema to her abdomen, shortness of breath which has been worsening over the past few days.  She does admit to a 7 pound weight gain over the past few days.  She does have a history of CHF and has required recent admissions.  Patient notes that she has been compliant with her home diuretics.  She notes that she has had decreased urine output as well.  She denies any associated chest pain.  She denies any nausea, vomiting, diarrhea.   Shortness of Breath      Prior to Admission medications   Medication Sig Start Date End Date Taking? Authorizing Provider  albuterol  (VENTOLIN  HFA) 108 (90 Base) MCG/ACT inhaler Inhale 2 puffs into the lungs every 2 (two) hours as needed for wheezing or shortness of breath. 02/13/24   Pearlean Manus, MD  carvedilol  (COREG ) 12.5 MG tablet Take 1 tablet (12.5 mg total) by mouth 2 (two) times daily with a meal. 02/13/24   Emokpae, Courage, MD  cloNIDine  (CATAPRES ) 0.3 MG tablet Take 1 tablet (0.3 mg total) by mouth 2 (two) times daily. 02/13/24   Pearlean Manus, MD  Continuous Glucose Receiver (DEXCOM G7 RECEIVER) DEVI Use as directed 03/20/24   Antonetta Rollene BRAVO, MD  Continuous Glucose Sensor (DEXCOM G7 SENSOR) MISC Use 1 every 10 days 03/20/24   Antonetta Rollene BRAVO, MD  hydrALAZINE  (APRESOLINE ) 100 MG tablet Take 1 tablet (100 mg total) by mouth 3 (three) times daily. 02/13/24   Pearlean Manus, MD  insulin  degludec (TRESIBA ) 200 UNIT/ML FlexTouch Pen Inject 6 Units into the skin daily at 10 pm. 02/13/24   Pearlean Manus, MD  insulin  lispro (HUMALOG  KWIKPEN) 100 UNIT/ML KwikPen Inject 0-12 Units into the skin 3 (three) times  daily. Short acting Humalog  insulin  per sliding scale 0-12 ---:-- Insulin  injection 0-12 Units 0-12 Units Subcutaneous, 3 times daily with meals CBG 70 - 120: 0 unit CBG 121 - 150: 0 unit  CBG 151 - 200: 2 unit CBG 201 - 250: 4 units CBG 251 - 300: 6 units CBG 301 - 350: 8 units  CBG 351 - 400: 10 units  CBG > 400: 12 units 02/13/24   Emokpae, Courage, MD  isosorbide  mononitrate (IMDUR ) 60 MG 24 hr tablet Take 1 tablet (60 mg total) by mouth daily. 02/13/24   Pearlean Manus, MD  magnesium  oxide (MAG-OX) 400 (240 Mg) MG tablet Take 1 tablet (400 mg total) by mouth daily. 02/13/24   Pearlean Manus, MD  NIFEdipine  (PROCARDIA  XL/NIFEDICAL XL) 60 MG 24 hr tablet Take 1 tablet (60 mg total) by mouth daily. 04/01/24   Sheron Lorette GRADE, PA-C  Potassium Chloride  ER 20 MEQ TBCR Take 1 tablet by mouth daily. 02/10/24   [provider]  RESTASIS  0.05 % ophthalmic emulsion Place 1 drop into both eyes 2 (two) times daily. 11/01/23   [provider]  rosuvastatin  (CRESTOR ) 5 MG tablet Take 1 tablet (5 mg total) by mouth daily. 02/13/24   Pearlean Manus, MD  torsemide  (DEMADEX ) 20 MG tablet Take 60 mg ( 3 Tablets) in the Morning and Take 40 mg ( 2 Tablets) in the Afternoon. 04/01/24   Dunlap, Scotesia  Y, PA-C    Allergies: Farxiga [dapagliflozin], Metformin  and related, Benadryl [diphenhydramine hcl], Citalopram, Tramadol , Amlodipine , and Crestor  [rosuvastatin ]    Review of Systems  Respiratory:  Positive for shortness of breath.   All other systems reviewed and are negative.   Updated Vital Signs BP (!) 143/88   Pulse (!) 59   Resp 18   Ht 5' (1.524 m)   Wt 59.4 kg   SpO2 95%   BMI 25.58 kg/m   Physical Exam Vitals and nursing note reviewed.  Constitutional:      General: She is not in acute distress.    Appearance: Normal appearance. She is not ill-appearing.  HENT:     Head: Normocephalic and atraumatic.     Nose: Nose normal.     Mouth/Throat:     Mouth: Mucous membranes are  moist.  Eyes:     Extraocular Movements: Extraocular movements intact.     Conjunctiva/sclera: Conjunctivae normal.     Pupils: Pupils are equal, round, and reactive to light.  Cardiovascular:     Rate and Rhythm: Normal rate and regular rhythm.     Pulses: Normal pulses.     Heart sounds: Normal heart sounds. No murmur heard.    No gallop.  Pulmonary:     Effort: Pulmonary effort is normal. No tachypnea.     Breath sounds: Rales present. No decreased breath sounds, wheezing or rhonchi.  Chest:     Chest wall: No mass or tenderness.  Abdominal:     General: Abdomen is flat. Bowel sounds are normal.     Palpations: Abdomen is soft. There is no mass.     Tenderness: There is no abdominal tenderness.  Musculoskeletal:        General: Normal range of motion.     Cervical back: Normal range of motion and neck supple.     Right lower leg: Edema present.     Left lower leg: Edema present.  Skin:    General: Skin is warm and dry.  Neurological:     General: No focal deficit present.     Mental Status: She is alert and oriented to person, place, and time. Mental status is at baseline.  Psychiatric:        Mood and Affect: Mood normal.        Behavior: Behavior normal.        Thought Content: Thought content normal.        Judgment: Judgment normal.     (all labs ordered are listed, but only abnormal results are displayed) Labs Reviewed  BRAIN NATRIURETIC PEPTIDE - Abnormal; Notable for the following components:      Result Value   B Natriuretic Peptide 962.0 (*)    All other components within normal limits  CBC WITH DIFFERENTIAL/PLATELET - Abnormal; Notable for the following components:   WBC 3.1 (*)    RBC 2.73 (*)    Hemoglobin 8.8 (*)    HCT 24.7 (*)    Lymphs Abs 0.4 (*)    All other components within normal limits  COMPREHENSIVE METABOLIC PANEL WITH GFR  URINALYSIS, ROUTINE W REFLEX MICROSCOPIC  TROPONIN I (HIGH SENSITIVITY)    EKG: EKG  Interpretation Date/Time:  Monday April 07 2024 15:11:57 EDT Ventricular Rate:  58 PR Interval:  232 QRS Duration:  94 QT Interval:  449 QTC Calculation: 441 R Axis:   33  Text Interpretation: Sinus rhythm Prolonged PR interval Confirmed by Francesca Fallow ([id number]) on 04/07/2024 3:53:17 PM  Radiology:  DG Chest Port 1 View Result Date: 04/07/2024 CLINICAL DATA:  Shortness of breath for several days. EXAM: PORTABLE CHEST 1 VIEW COMPARISON:  03/10/2024 FINDINGS: Stable mild cardiomegaly. Stable small bilateral pleural effusions. Both lungs are otherwise clear. IMPRESSION: Stable mild cardiomegaly and small bilateral pleural effusions. No new findings. Electronically Signed   By: Norleen DELENA Kil M.D.   On: 04/07/2024 15:43     .Critical Care  Performed by: Daralene Lonni BIRCH, PA-C Authorized by: Daralene Lonni BIRCH, PA-C   Critical care provider statement:    Critical care time (minutes):  35   Critical care was necessary to treat or prevent imminent or life-threatening deterioration of the following conditions: CHF, hyponatremia.   Critical care was time spent personally by me on the following activities:  Development of treatment plan with patient or surrogate, discussions with consultants, evaluation of patient's response to treatment, examination of patient, ordering and review of laboratory studies, ordering and review of radiographic studies, ordering and performing treatments and interventions, pulse oximetry, re-evaluation of patient's condition and review of old charts   I assumed direction of critical care for this patient from another provider in my specialty: no     Care discussed with: admitting provider      Medications Ordered in the ED  furosemide  (LASIX ) injection 60 mg (60 mg Intravenous Given 04/07/24 1553)                                    Medical Decision Making Amount and/or Complexity of Data Reviewed Labs: ordered. Radiology: ordered.  Risk Prescription  drug management. Decision regarding hospitalization.  This patient presents to the ED for concern of edema, dyspnea, this involves an extensive number of treatment options, and is a complaint that carries with it a high risk of complications and morbidity.  The differential diagnosis includes CHF, ACS, pulmonary embolus, pericarditis, myocarditis, endocarditis, sepsis, pneumonia, pneumothorax, hemothorax, electrolyte derangement, acute kidney failure   Co morbidities that complicate the patient evaluation  Chronic kidney disease and CHF   Additional history obtained:  Additional history obtained from family External records from outside source obtained and reviewed including medical records   Lab Tests:  I Ordered, and personally interpreted labs.  The pertinent results include: Leukopenia, anemia, hyponatremia, elevated creatinine but at baseline, normal liver function, normal troponin, elevated BNP   Imaging Studies ordered:  I ordered imaging studies including chest x-ray I independently visualized and interpreted imaging which showed cardiomegaly with bilateral pleural effusions I agree with the radiologist interpretation   Cardiac Monitoring: / EKG:  The patient was maintained on a cardiac monitor.  I personally viewed and interpreted the cardiac monitored which showed an underlying rhythm of: Normal sinus rhythm, no ST/T wave changes, no ischemic changes, no STEMI   Consultations Obtained:  I requested consultation with the hospitalist,  and discussed lab and imaging findings as well as pertinent plan - they recommend: Admission   Problem List / ED Course / Critical interventions / Medication management  Patient does remain stable at this time.  She has maintained her oxygen saturations on room air.  Blood pressure has remained stable while in the emergency department.  She has been started on IV Lasix .  Will avoid any IV fluids as do suspect that her hyponatremia is  secondary to fluid overload.  Chest x-ray demonstrated small bilateral pleural effusions.  Blood work is at her baseline at this time  other than her hyponatremia.  She does have a troponin which is within normal limits but an elevated BNP.  Do not suspect ACS at this time.  Have discussed patient case with Dr. FORBES Carwin with the hospitalist service who has excepted for admission. I ordered medication including Lasix  for CHF and edema Reevaluation of the patient after these medicines showed that the patient improved I have reviewed the patients home medicines and have made adjustments as needed   Social Determinants of Health:  None   Test / Admission - Considered:  Admission     Final diagnoses:  None    ED Discharge Orders     None          Daralene Lonni JONETTA DEVONNA 04/07/24 1804    Francesca Elsie CROME, MD 04/08/24 1606

## 2024-04-07 NOTE — Telephone Encounter (Signed)
 This RN contacted patient and spoke with Emily Jennings, who stated he is in the process of taking the patient to the ED for difficulty breathing. Patient will follow up afterwarsd.

## 2024-04-08 DIAGNOSIS — E1322 Other specified diabetes mellitus with diabetic chronic kidney disease: Secondary | ICD-10-CM

## 2024-04-08 DIAGNOSIS — E871 Hypo-osmolality and hyponatremia: Secondary | ICD-10-CM | POA: Diagnosis not present

## 2024-04-08 DIAGNOSIS — I5033 Acute on chronic diastolic (congestive) heart failure: Secondary | ICD-10-CM | POA: Diagnosis not present

## 2024-04-08 DIAGNOSIS — I10 Essential (primary) hypertension: Secondary | ICD-10-CM | POA: Diagnosis not present

## 2024-04-08 LAB — BASIC METABOLIC PANEL WITH GFR
Anion gap: 8 (ref 5–15)
Anion gap: 11 (ref 5–15)
Anion gap: 12 (ref 5–15)
BUN: 51 mg/dL — ABNORMAL HIGH (ref 8–23)
BUN: 52 mg/dL — ABNORMAL HIGH (ref 8–23)
BUN: 53 mg/dL — ABNORMAL HIGH (ref 8–23)
CO2: 29 mmol/L (ref 22–32)
CO2: 28 mmol/L (ref 22–32)
CO2: 28 mmol/L (ref 22–32)
Calcium: 8 mg/dL — ABNORMAL LOW (ref 8.9–10.3)
Calcium: 8 mg/dL — ABNORMAL LOW (ref 8.9–10.3)
Calcium: 8.1 mg/dL — ABNORMAL LOW (ref 8.9–10.3)
Chloride: 86 mmol/L — ABNORMAL LOW (ref 98–111)
Chloride: 84 mmol/L — ABNORMAL LOW (ref 98–111)
Chloride: 83 mmol/L — ABNORMAL LOW (ref 98–111)
Creatinine, Ser: 2.74 mg/dL — ABNORMAL HIGH (ref 0.44–1.00)
Creatinine, Ser: 2.89 mg/dL — ABNORMAL HIGH (ref 0.44–1.00)
Creatinine, Ser: 2.8 mg/dL — ABNORMAL HIGH (ref 0.44–1.00)
GFR, Estimated: 16 mL/min — ABNORMAL LOW (ref >60)
GFR, Estimated: 15 mL/min — ABNORMAL LOW (ref >60)
GFR, Estimated: 16 mL/min — ABNORMAL LOW (ref >60)
Glucose, Bld: 205 mg/dL — ABNORMAL HIGH (ref 70–99)
Glucose, Bld: 155 mg/dL — ABNORMAL HIGH (ref 70–99)
Glucose, Bld: 194 mg/dL — ABNORMAL HIGH (ref 70–99)
Potassium: 3.5 mmol/L (ref 3.5–5.1)
Potassium: 3.8 mmol/L (ref 3.5–5.1)
Potassium: 4.3 mmol/L (ref 3.5–5.1)
Sodium: 123 mmol/L — ABNORMAL LOW (ref 135–145)
Sodium: 123 mmol/L — ABNORMAL LOW (ref 135–145)
Sodium: 123 mmol/L — ABNORMAL LOW (ref 135–145)

## 2024-04-08 LAB — URINALYSIS, ROUTINE W REFLEX MICROSCOPIC
Bilirubin Urine: NEGATIVE
Glucose, UA: >=500 mg/dL — AB
Hgb urine dipstick: NEGATIVE
Ketones, ur: NEGATIVE mg/dL
Leukocytes,Ua: NEGATIVE
Nitrite: NEGATIVE
Protein, ur: >=300 mg/dL — AB
Specific Gravity, Urine: 1.006 (ref 1.005–1.030)
pH: 7 (ref 5.0–8.0)

## 2024-04-08 LAB — CBC
HCT: 22.6 % — ABNORMAL LOW (ref 36.0–46.0)
Hemoglobin: 7.8 g/dL — ABNORMAL LOW (ref 12.0–15.0)
MCH: 31.2 pg (ref 26.0–34.0)
MCHC: 34.5 g/dL (ref 30.0–36.0)
MCV: 90.4 fL (ref 80.0–100.0)
Platelets: 129 K/uL — ABNORMAL LOW (ref 150–400)
RBC: 2.5 MIL/uL — ABNORMAL LOW (ref 3.87–5.11)
RDW: 13.1 % (ref 11.5–15.5)
WBC: 2.9 K/uL — ABNORMAL LOW (ref 4.0–10.5)
nRBC: 0 % (ref 0.0–0.2)

## 2024-04-08 LAB — GLUCOSE, CAPILLARY
Glucose-Capillary: 168 mg/dL — ABNORMAL HIGH (ref 70–99)
Glucose-Capillary: 142 mg/dL — ABNORMAL HIGH (ref 70–99)
Glucose-Capillary: 95 mg/dL (ref 70–99)
Glucose-Capillary: 194 mg/dL — ABNORMAL HIGH (ref 70–99)

## 2024-04-08 MED ORDER — CARVEDILOL 12.5 MG PO TABS
25.0000 mg | ORAL_TABLET | Freq: Two times a day (BID) | ORAL | Status: DC
  Administered 2024-04-08 – 2024-04-10 (×5): 25 mg via ORAL
  Filled 2024-04-08 (×5): qty 2

## 2024-04-08 MED ORDER — CLONIDINE HCL 0.2 MG PO TABS
0.3000 mg | ORAL_TABLET | Freq: Three times a day (TID) | ORAL | Status: DC
  Administered 2024-04-08 – 2024-04-13 (×17): 0.3 mg via ORAL
  Filled 2024-04-08 (×17): qty 1

## 2024-04-08 MED ORDER — FUROSEMIDE 10 MG/ML IJ SOLN: 40.0000 mg | Freq: Two times a day (BID) | INTRAMUSCULAR | Status: DC

## 2024-04-08 MED ORDER — NICARDIPINE HCL IN NACL 20-0.86 MG/200ML-% IV SOLN
3.0000 mg/h | INTRAVENOUS | Status: DC
  Administered 2024-04-08: 5 mg/h via INTRAVENOUS
  Filled 2024-04-08: qty 200

## 2024-04-08 MED ORDER — IPRATROPIUM-ALBUTEROL 0.5-2.5 (3) MG/3ML IN SOLN
3.0000 mL | Freq: Three times a day (TID) | RESPIRATORY_TRACT | Status: DC | PRN
  Administered 2024-04-15: 3 mL via RESPIRATORY_TRACT
  Filled 2024-04-08 (×2): qty 3

## 2024-04-08 MED ORDER — HYDRALAZINE HCL 50 MG PO TABS
50.0000 mg | ORAL_TABLET | Freq: Four times a day (QID) | ORAL | Status: DC | PRN
  Administered 2024-04-08: 50 mg via ORAL
  Filled 2024-04-08: qty 1

## 2024-04-08 MED ORDER — FUROSEMIDE 10 MG/ML IJ SOLN
40.0000 mg | Freq: Two times a day (BID) | INTRAMUSCULAR | Status: DC
  Administered 2024-04-08 – 2024-04-09 (×3): 40 mg via INTRAVENOUS
  Filled 2024-04-08 (×3): qty 4

## 2024-04-08 MED ORDER — HEPARIN SODIUM (PORCINE) 5000 UNIT/ML IJ SOLN
5000.0000 [IU] | Freq: Three times a day (TID) | INTRAMUSCULAR | Status: DC
  Administered 2024-04-08 – 2024-04-21 (×39): 5000 [IU] via SUBCUTANEOUS
  Filled 2024-04-08 (×38): qty 1

## 2024-04-08 MED ORDER — CHLORHEXIDINE GLUCONATE CLOTH 2 % EX PADS
6.0000 | MEDICATED_PAD | Freq: Every day | CUTANEOUS | Status: DC
  Administered 2024-04-08: 6 via TOPICAL

## 2024-04-08 NOTE — TOC Initial Note (Signed)
 Transition of Care [facility]) - Initial/Assessment Note    Patient Details  Name: Emily Jennings MRN: [id number] Date of Birth: [birth year]  [facility] CM/SW Contact:    Sharlyne Stabs, RN Phone Number: 04/08/2024, 10:51 AM  Clinical Narrative:    Patient admitted with Acute on chronic congestive heart failure.  Readmitted and assessed last month due to high risk for readmission and well known to [facility] from past hospital admission.  She lives with her spouse and son and they are able to assist with ADLs when needed. Spouse provides transportation for pt. Uses a cane in the home. Active with  [facility] services. TOC to follow.              Expected Discharge Plan: Home w Home Health Services Barriers to Discharge: Continued Medical Work up   Patient Goals and CMS Choice Patient states their goals for this hospitalization and ongoing recovery are:: return home CMS Medicare.gov Compare Post Acute Care list provided to:: Patient Choice offered to / list presented to : Patient        Prior Living Arrangements/Services   Lives with:: Spouse, Adult Children Patient language and need for interpreter reviewed:: Yes        Need for Family Participation in Patient Care: Yes (Comment) Care giver support system in place?: Yes (comment) Current home services: DME Criminal Activity/Legal Involvement Pertinent to Current Situation/Hospitalization: No - Comment as needed  Emotional Assessment     Affect (typically observed): Accepting Orientation: : Oriented to Place, Oriented to Situation, Oriented to Self Alcohol / Substance Use: Not Applicable Psych Involvement: No (comment)  Admission diagnosis:  Hyponatremia [E87.1] Acute on chronic diastolic (congestive) heart failure (HCC) [I50.33] Anemia, unspecified type [D64.9] Chronic kidney disease, unspecified CKD stage [N18.9] Acute congestive heart failure, unspecified heart failure type (HCC) [I50.9] Patient Active Problem  List   Diagnosis Date Noted   Anemia due to chronic kidney disease 02/04/2024   Type 2 diabetes mellitus with hyperglycemia (HCC) 01/22/2024   Insulin  long-term use (HCC) 12/05/2023   Type 2 diabetes mellitus with other specified complication (HCC) 09/25/2023   Acute kidney injury superimposed on stage 4 chronic kidney disease (HCC) 08/14/2023   Small bowel obstruction (HCC) 07/19/2023   Ischemic bowel disease (HCC) 07/19/2023   Incarcerated left inguinal hernia 07/18/2023   Hypoglycemia 07/18/2023   Uncontrolled type 2 diabetes mellitus with hypoglycemia, with long-term current use of insulin  (HCC) 07/04/2023   Dysphagia 07/04/2023   HCAP (healthcare-associated pneumonia) 07/03/2023   Essential hypertension 06/15/2023   Cerebellar edema (HCC) 05/23/2023   Mixed hyperlipidemia 05/23/2023   DNR (do not resuscitate) 05/17/2023   Vocal cord dysfunction 05/17/2023   DNR (do not resuscitate) discussion 05/17/2023   Goals of care, counseling/discussion 05/17/2023   Right cerebellar intraparenchymal hemorrhage due to hypertension 05/16/2023   Unsteady gait when walking 04/03/2023   Hospital discharge follow-up 03/06/2023   Bilateral kidney stones 03/06/2023   Hip swelling, right 02/27/2023   DKA (diabetic ketoacidosis) (HCC) 02/17/2023   High anion gap metabolic acidosis 02/17/2023   Pseudohyponatremia 02/17/2023   Hypoalbuminemia due to protein-calorie malnutrition (HCC) 02/17/2023   Dehydration 02/17/2023   Hip pain 02/15/2023   Encounter for Medicare annual examination with abnormal findings 10/16/2022   Vitamin D  deficiency 10/16/2022   Acute on chronic congestive heart failure with left ventricular diastolic dysfunction (HCC) 09/13/2022   Chronic kidney disease (CKD) stage G3b/A2, moderately decreased glomerular filtration rate (GFR) between 30-44 mL/min/1.73 square meter and albuminuria creatinine  ratio between 30-299 mg/g (HCC) 09/13/2022   Mild aortic valve stenosis 09/13/2022    Dyspnea on exertion 09/05/2022   CHF (congestive heart failure) (HCC) 09/05/2022   Hyperkalemia 04/09/2022   Encounter for support and coordination of transition of care 04/09/2022   Hyperglycemic crisis due to diabetes mellitus (HCC) 03/25/2022   Hyponatremia 03/25/2022   Acute metabolic encephalopathy 03/22/2022   Hypothermia 03/22/2022   Heart murmur 03/22/2022   Bilateral lower extremity edema 03/22/2022   Syncope and collapse 01/13/2021   Lip swelling 10/30/2020   CKD stage 4 due to type 2 diabetes mellitus (HCC) 10/30/2020   Tubular adenoma of colon 10/26/2017   Diabetes (HCC) 07/27/2016   Osteoporosis 07/28/2014   Diabetic hypertension-nephrosis syndrome (HCC) 03/23/2014   Type 2 diabetes mellitus with stage 3a chronic kidney disease, with long-term current use of insulin  (HCC) 09/29/2013   Allergic rhinitis 01/27/2013   GERD (gastroesophageal reflux disease) 01/12/2013   CVD (cardiovascular disease) 07/17/2011   Hypokalemia 08/31/2008   Hyperlipidemia LDL goal <100 07/15/2007   Resistant hypertension 07/15/2007   PCP:  Antonetta Rollene BRAVO, MD Pharmacy:   [facility] [phone] - [place],  - [address] [facility] [address] [place] [address] Phone: [phone] Fax: [phone]  [facility]) - [place], [place] - [address] [facility] [address] [place] [address] [place] [address] Phone: [phone] Fax: [phone]  [facility] [address] [place] [address] Phone: [phone] Fax: [phone]     Social Drivers of Health (SDOH) Social History: SDOH Screenings   Food Insecurity: No Food Insecurity (04/07/2024)  Housing: Low Risk  (04/07/2024)  Transportation Needs: No Transportation Needs (04/07/2024)  Utilities: Not At Risk (04/07/2024)  Alcohol Screen: Low Risk  (11/15/2022)  Depression (PHQ2-9): Low Risk  (03/20/2024)  Financial Resource Strain: Low Risk  (12/28/2022)   Physical Activity: Sufficiently Active (11/15/2022)  Social Connections: Socially Integrated (04/07/2024)  Stress: No Stress Concern Present (12/28/2022)  Tobacco Use: Low Risk  (04/07/2024)   SDOH Interventions:     Readmission Risk Interventions    04/08/2024   10:49 AM 03/11/2024    8:30 AM 02/05/2024   12:02 PM  Readmission Risk Prevention Plan  Transportation Screening Complete Complete Complete  Medication Review Oceanographer) Complete Complete Complete  PCP or Specialist appointment within 3-5 days of discharge Not Complete  Not Complete  HRI or Home Care Consult Complete Complete Complete  SW Recovery Care/Counseling Consult  Complete Complete  Palliative Care Screening Not Applicable Not Applicable Not Applicable  Skilled Nursing Facility Not Applicable Complete Complete

## 2024-04-08 NOTE — Plan of Care (Signed)
   Problem: Education: Goal: Knowledge of General Education information will improve Description Including pain rating scale, medication(s)/side effects and non-pharmacologic comfort measures Outcome: Progressing   Problem: Education: Goal: Knowledge of General Education information will improve Description Including pain rating scale, medication(s)/side effects and non-pharmacologic comfort measures Outcome: Progressing

## 2024-04-08 NOTE — Plan of Care (Signed)

## 2024-04-08 NOTE — Progress Notes (Signed)
  Progress Note   Patient: Emily Jennings FMW:[id number] DOB: 11-29-1933 DOA: 04/07/2024     1 DOS: the patient was seen and examined on 04/08/2024   Brief hospital admission narrative: Emily Jennings is a 89 y.o. female with medical history significant for CHF, diabetes mellitus, hypertension, CKD 4. Patient presented to the ED with complaints of difficulty breathing over the past 2 days, swelling to bilateral lower extremities up to her thighs, swelling of her arms.  She reports weight gain from 128-135 today.  She reports compliance with her medication-torsemide  60/40mg . Torsemide  dose was increased 7/15.   Recent hospitalizations for same-most recent 6/23 to 6/25.   ED Course: Temperature 92.8.  Heart rate 56-64.  Respirate rate 11-25.  Blood pressure systolic 143-174.  O2 sat greater than 95% on room air.  BNP 962.  Troponin 6 > 7. Chest x-ray small bilateral pleural effusions. Lasix  60 mg x 1 given  Assessment and plan 1-acute on chronic diastolic heart failure -Continue IV diuresis -Follow daily weights, strict I's and O's, Reds clip measurements and low-sodium diet. - Recent 2D echo demonstrating ejection fraction 70 to 75% with grade 2 diastolic dysfunction (no need to be repeated). - Significant fluid overload appreciated on exam and patient has presence of orthopnea.  2-hypertensive crisis - Will continue adjusted antihypertensive agents - Patient has been transition off Cardene  drip initially if required and demonstrating a stable vital signs. - Continue monitoring.  3-type 2 diabetes mellitus with nephropathy - A1c 7.4 - Continue to follow CBG fluctuation and as hypoglycemic regimen as required. - Continue sliding scale insulin  and the use of Semglee .  4-chronic kidney disease stage IV - Currently stable -Continue to follow renal function trend stability especially with ongoing IV diuresis. - Follow electrolytes and replete them as needed.  5-hyponatremia - Appears to  be associated with fluid overload - Continue treatment with diuretics - Follow electrolytes trend - Patient asymptomatic.  Subjective:  Complaining of orthopnea and demonstrating fluid overload - No chest pain, no nausea, no vomiting.  Off Cardene  drip with stable vital signs at the moment.  Physical Exam: Vitals:   04/08/24 1300 04/08/24 1400 04/08/24 1500 04/08/24 1502  BP: (!) 173/65 (!) 144/58  (!) 144/58  Pulse: (!) 57 (!) 56 60 60  Resp: 10  17   Temp:      TempSrc:      SpO2: 99% 100% 97%   Weight:      Height:       General exam: Alert, awake, oriented x 3; following commands appropriately.  Chronically ill in appearance. Respiratory system: Fine crackles at the bases; no using accessory muscle.  No wheezing. Cardiovascular system: Rate controlled, no rubs, no gallops, no JVD. Gastrointestinal system: Abdomen is nondistended, soft and nontender.  Positive bowel sounds. Central nervous system: Alert and oriented. No focal neurological deficits. Extremities: No cyanosis or clubbing; 2-3+ edema appreciated bilaterally all the way to patient's abdomen. Skin: No petechiae. Psychiatry: Judgement and insight appear normal. Mood & affect appropriate.    Data Reviewed: CBC: WBCs 2.9, hemoglobin 7.8 and platelet count 1 29K Basic metabolic panel: Sodium 123, potassium 3.5, chloride 86, bicarb 29, BUN 51, creatinine 2.7 and GFR 16  Family Communication: No family at bedside.  Disposition: Status is: Inpatient Remains inpatient appropriate because: Continue IV diuresis.  Time spent: 50 minutes  Author: Eric Nunnery, MD 04/08/2024 3:59 PM  For on call review [email or web address].

## 2024-04-08 NOTE — Progress Notes (Signed)
 Patient remains severely hypertensive with SBP >200 despite clonidine , hydralazine , and IV Lasix . Plan to start nicardipine  infusion.

## 2024-04-09 DIAGNOSIS — Z7189 Other specified counseling: Secondary | ICD-10-CM | POA: Diagnosis not present

## 2024-04-09 DIAGNOSIS — N179 Acute kidney failure, unspecified: Secondary | ICD-10-CM | POA: Diagnosis not present

## 2024-04-09 DIAGNOSIS — I5033 Acute on chronic diastolic (congestive) heart failure: Secondary | ICD-10-CM | POA: Diagnosis not present

## 2024-04-09 DIAGNOSIS — E1122 Type 2 diabetes mellitus with diabetic chronic kidney disease: Secondary | ICD-10-CM | POA: Diagnosis not present

## 2024-04-09 DIAGNOSIS — Z515 Encounter for palliative care: Secondary | ICD-10-CM

## 2024-04-09 DIAGNOSIS — N184 Chronic kidney disease, stage 4 (severe): Secondary | ICD-10-CM

## 2024-04-09 LAB — FERRITIN: Ferritin: 92 ng/mL (ref 11–307)

## 2024-04-09 LAB — BASIC METABOLIC PANEL WITH GFR
Anion gap: 8 (ref 5–15)
Anion gap: 10 (ref 5–15)
BUN: 55 mg/dL — ABNORMAL HIGH (ref 8–23)
BUN: 55 mg/dL — ABNORMAL HIGH (ref 8–23)
CO2: 30 mmol/L (ref 22–32)
CO2: 29 mmol/L (ref 22–32)
Calcium: 8 mg/dL — ABNORMAL LOW (ref 8.9–10.3)
Calcium: 8.3 mg/dL — ABNORMAL LOW (ref 8.9–10.3)
Chloride: 87 mmol/L — ABNORMAL LOW (ref 98–111)
Chloride: 85 mmol/L — ABNORMAL LOW (ref 98–111)
Creatinine, Ser: 2.77 mg/dL — ABNORMAL HIGH (ref 0.44–1.00)
Creatinine, Ser: 2.67 mg/dL — ABNORMAL HIGH (ref 0.44–1.00)
GFR, Estimated: 16 mL/min — ABNORMAL LOW (ref >60)
GFR, Estimated: 17 mL/min — ABNORMAL LOW (ref >60)
Glucose, Bld: 117 mg/dL — ABNORMAL HIGH (ref 70–99)
Glucose, Bld: 202 mg/dL — ABNORMAL HIGH (ref 70–99)
Potassium: 3.4 mmol/L — ABNORMAL LOW (ref 3.5–5.1)
Potassium: 3.6 mmol/L (ref 3.5–5.1)
Sodium: 125 mmol/L — ABNORMAL LOW (ref 135–145)
Sodium: 124 mmol/L — ABNORMAL LOW (ref 135–145)

## 2024-04-09 LAB — IRON AND TIBC
Iron: 60 ug/dL (ref 28–170)
Saturation Ratios: 20 % (ref 10.4–31.8)
TIBC: 306 ug/dL (ref 250–450)
UIBC: 246 ug/dL

## 2024-04-09 LAB — GLUCOSE, CAPILLARY
Glucose-Capillary: 113 mg/dL — ABNORMAL HIGH (ref 70–99)
Glucose-Capillary: 186 mg/dL — ABNORMAL HIGH (ref 70–99)
Glucose-Capillary: 229 mg/dL — ABNORMAL HIGH (ref 70–99)
Glucose-Capillary: 190 mg/dL — ABNORMAL HIGH (ref 70–99)

## 2024-04-09 LAB — CBC
HCT: 22.8 % — ABNORMAL LOW (ref 36.0–46.0)
Hemoglobin: 7.9 g/dL — ABNORMAL LOW (ref 12.0–15.0)
MCH: 31.5 pg (ref 26.0–34.0)
MCHC: 34.6 g/dL (ref 30.0–36.0)
MCV: 90.8 fL (ref 80.0–100.0)
Platelets: 149 K/uL — ABNORMAL LOW (ref 150–400)
RBC: 2.51 MIL/uL — ABNORMAL LOW (ref 3.87–5.11)
RDW: 13.1 % (ref 11.5–15.5)
WBC: 2.9 K/uL — ABNORMAL LOW (ref 4.0–10.5)
nRBC: 0 % (ref 0.0–0.2)

## 2024-04-09 LAB — MAGNESIUM: Magnesium: 2.2 mg/dL (ref 1.7–2.4)

## 2024-04-09 MED ORDER — POTASSIUM CHLORIDE CRYS ER 20 MEQ PO TBCR
40.0000 meq | EXTENDED_RELEASE_TABLET | Freq: Once | ORAL | Status: AC
  Administered 2024-04-09: 40 meq via ORAL
  Filled 2024-04-09: qty 2

## 2024-04-09 MED ORDER — NIFEDIPINE ER OSMOTIC RELEASE 30 MG PO TB24
90.0000 mg | ORAL_TABLET | Freq: Every day | ORAL | Status: DC
  Administered 2024-04-09: 60 mg via ORAL
  Administered 2024-04-10: 90 mg via ORAL
  Filled 2024-04-09: qty 3

## 2024-04-09 MED ORDER — DARBEPOETIN ALFA 60 MCG/0.3ML IJ SOSY
60.0000 ug | PREFILLED_SYRINGE | INTRAMUSCULAR | Status: DC
  Administered 2024-04-09 – 2024-04-16 (×2): 60 ug via SUBCUTANEOUS
  Filled 2024-04-09 (×2): qty 0.3

## 2024-04-09 MED ORDER — NIFEDIPINE ER OSMOTIC RELEASE 30 MG PO TB24
30.0000 mg | ORAL_TABLET | Freq: Once | ORAL | Status: AC
  Administered 2024-04-09: 30 mg via ORAL
  Filled 2024-04-09: qty 1

## 2024-04-09 MED ORDER — SODIUM BICARBONATE 650 MG PO TABS
650.0000 mg | ORAL_TABLET | Freq: Two times a day (BID) | ORAL | Status: DC
  Administered 2024-04-09 – 2024-04-13 (×9): 650 mg via ORAL
  Filled 2024-04-09 (×10): qty 1

## 2024-04-09 MED ORDER — ENSURE PLUS HIGH PROTEIN PO LIQD
237.0000 mL | Freq: Two times a day (BID) | ORAL | Status: DC
  Administered 2024-04-10 – 2024-04-21 (×19): 237 mL via ORAL

## 2024-04-09 MED ORDER — FUROSEMIDE 10 MG/ML IJ SOLN
80.0000 mg | Freq: Two times a day (BID) | INTRAMUSCULAR | Status: DC
  Administered 2024-04-09 – 2024-04-10 (×2): 80 mg via INTRAVENOUS
  Filled 2024-04-09 (×2): qty 8

## 2024-04-09 NOTE — Progress Notes (Signed)
   04/09/24 1500  ReDS Vest / Clip  Station Marker A  Ruler Value 32  ReDS Value Range < 36  ReDS Actual Value 26  Anatomical Comments sitting in chair

## 2024-04-09 NOTE — Consult Note (Signed)
 [facility]  HISTORY AND PHYSICAL  Emily Jennings is an 89 y.o. female.    Chief Complaint: SOB and swelling  HPI: Pt is an 70 F with a PMH for HTN, HLD CHF, CKD IV, CAD, and DM II who is seen in consultation at the request of Dr Willette for eval and recs re: AKI on CKD and volume overload.  Came to [facility] for several day history of worsening SOB, leg swelling and increased weights. OP torsemide  had been increased on 7/15 but was ineffective.  Presented to [facility] on 7/21.     Initial cr was 2.66, up to 2.77 today.  Initial Na was 114, now up to 125.  On Lasix  40 IV BID with some improvement in weights but still quite SOB.    In this setting we are asked to see,  Pt speaking in short, brief sentences.  Sitting in chair.  Says her legs are better.     PMH: Past Medical History:  Diagnosis Date   Allergy    Anxiety    ANXIETY DISORDER, GENERALIZED 07/15/2007   Qualifier: Diagnosis of  By: Aisha Motes     Arthritis    Cancer [facility]) 2009   breast, carcinoma in situ left   Carcinoma in situ of breast 05/21/2008   Qualifier: Diagnosis of  By: Antonetta MD, Margaret  Diagnosed in 2009, completed 5 year course of tamoxifen, no evidence of recurrence    Carotid stenosis    11/16/2005  mild plaque formation and stenosis proximal right ECA   Cataract    Complication of anesthesia    Coronary artery disease    cardiac catheterization on 03/20/2006  LAD mid 40% stenosis, left circumflex mild 40% stenosis, RCA mid-vessel 40% to 50% lesion   EF 60%   Diabetes mellitus    GERD (gastroesophageal reflux disease)    Hernia, inguinal    left   Hyperglycemia    Hypertension    Insomnia 11/16/2011   Low blood potassium    Non-insulin  dependent type 2 diabetes mellitus (HCC)    Osteoporosis    Shortness of breath    2D Echocardiogram 01/26/2009   EF of greater than 55%, mild MR, mild TR, normal ventricular function   Thickened endometrium 10/26/2017   Noted by gyne in 2017, missed 6  month follow up, referred in 09/2017   Ventricular tachycardia, non-sustained (HCC)    developed during stress test 02/08/2006, spontaneously aborted, mild reversible apical defect   PSH: Past Surgical History:  Procedure Laterality Date   BOWEL RESECTION Left 07/19/2023   Procedure: SMALL BOWEL RESECTION;  Surgeon: Kallie Manuelita BROCKS, MD;  Location: [facility];  Service: General;  Laterality: Left;   BREAST LUMPECTOMY Left 2009   Left breast 2009   CATARACT EXTRACTION W/PHACO Left 10/28/2014   Procedure: PHACO EMULSION CATARACT EXTRACTION WITH INTRAOCULAR LENS IMPLANT LEFT EYE (IOC);  Surgeon: Gaither Quan, MD;  Location[facility] OR;  Service: Ophthalmology;  Laterality: Left;   COLONOSCOPY     cyst removed from left foot     INGUINAL HERNIA REPAIR Left 07/19/2023   Procedure: HERNIA REPAIR FEMORAL INCARCERATED;  Surgeon: Kallie Manuelita BROCKS, MD;  Location: [facility];  Service: General;  Laterality: Left;   REFRACTIVE SURGERY Left     Past Medical History:  Diagnosis Date   Allergy    Anxiety    ANXIETY DISORDER, GENERALIZED 07/15/2007   Qualifier: Diagnosis of  By: Aisha Motes     Arthritis    Cancer (  HCC) 2009   breast, carcinoma in situ left   Carcinoma in situ of breast 05/21/2008   Qualifier: Diagnosis of  By: Antonetta MD, Margaret  Diagnosed in 2009, completed 5 year course of tamoxifen, no evidence of recurrence    Carotid stenosis    11/16/2005  mild plaque formation and stenosis proximal right ECA   Cataract    Complication of anesthesia    Coronary artery disease    cardiac catheterization on 03/20/2006  LAD mid 40% stenosis, left circumflex mild 40% stenosis, RCA mid-vessel 40% to 50% lesion   EF 60%   Diabetes mellitus    GERD (gastroesophageal reflux disease)    Hernia, inguinal    left   Hyperglycemia    Hypertension    Insomnia 11/16/2011   Low blood potassium    Non-insulin  dependent type 2 diabetes mellitus (HCC)    Osteoporosis    Shortness of breath    2D  Echocardiogram 01/26/2009   EF of greater than 55%, mild MR, mild TR, normal ventricular function   Thickened endometrium 10/26/2017   Noted by gyne in 2017, missed 6 month follow up, referred in 09/2017   Ventricular tachycardia, non-sustained (HCC)    developed during stress test 02/08/2006, spontaneously aborted, mild reversible apical defect    Medications:  Scheduled:  carvedilol   25 mg Oral BID WC   Chlorhexidine  Gluconate Cloth  6 each Topical Daily   cloNIDine   0.3 mg Oral TID   darbepoetin (ARANESP ) injection - DIALYSIS  60 mcg Subcutaneous Q Wed-1800   furosemide   80 mg Intravenous BID   heparin  injection (subcutaneous)  5,000 Units Subcutaneous Q8H   hydrALAZINE   100 mg Oral TID   insulin  aspart  0-5 Units Subcutaneous QHS   insulin  aspart  0-9 Units Subcutaneous TID WC   insulin  glargine-yfgn  6 Units Subcutaneous QHS   isosorbide  mononitrate  60 mg Oral Daily   NIFEdipine   90 mg Oral Daily   potassium chloride   40 mEq Oral Once   sodium bicarbonate   650 mg Oral BID    Medications Prior to Admission  Medication Sig Dispense Refill   albuterol  (VENTOLIN  HFA) 108 (90 Base) MCG/ACT inhaler Inhale 2 puffs into the lungs every 2 (two) hours as needed for wheezing or shortness of breath. 17 g 1   carvedilol  (COREG ) 12.5 MG tablet Take 1 tablet (12.5 mg total) by mouth 2 (two) times daily with a meal. 60 tablet 5   cloNIDine  (CATAPRES ) 0.3 MG tablet Take 1 tablet (0.3 mg total) by mouth 2 (two) times daily. 60 tablet 5   Continuous Glucose Receiver (DEXCOM G7 RECEIVER) DEVI Use as directed 1 each 0   Continuous Glucose Sensor (DEXCOM G7 SENSOR) MISC Use 1 every 10 days 3 each 0   hydrALAZINE  (APRESOLINE ) 100 MG tablet Take 1 tablet (100 mg total) by mouth 3 (three) times daily. 270 tablet 1   insulin  degludec (TRESIBA ) 200 UNIT/ML FlexTouch Pen Inject 6 Units into the skin daily at 10 pm. 3 mL 2   insulin  lispro (HUMALOG  KWIKPEN) 100 UNIT/ML KwikPen Inject 0-12 Units into the skin  3 (three) times daily. Short acting Humalog  insulin  per sliding scale 0-12 ---:-- Insulin  injection 0-12 Units 0-12 Units Subcutaneous, 3 times daily with meals CBG 70 - 120: 0 unit CBG 121 - 150: 0 unit  CBG 151 - 200: 2 unit CBG 201 - 250: 4 units CBG 251 - 300: 6 units CBG 301 - 350: 8 units  CBG 351 -  400: 10 units  CBG > 400: 12 units 15 mL 11   isosorbide  mononitrate (IMDUR ) 60 MG 24 hr tablet Take 1 tablet (60 mg total) by mouth daily. 30 tablet 5   magnesium  oxide (MAG-OX) 400 (240 Mg) MG tablet Take 1 tablet (400 mg total) by mouth daily. 30 tablet 1   NIFEdipine  (PROCARDIA  XL/NIFEDICAL XL) 60 MG 24 hr tablet Take 1 tablet (60 mg total) by mouth daily. 90 tablet 3   Potassium Chloride  ER 20 MEQ TBCR Take 1 tablet by mouth daily.     RESTASIS  0.05 % ophthalmic emulsion Place 1 drop into both eyes 2 (two) times daily.     rosuvastatin  (CRESTOR ) 5 MG tablet Take 1 tablet (5 mg total) by mouth daily. 90 tablet 3   torsemide  (DEMADEX ) 20 MG tablet Take 60 mg ( 3 Tablets) in the Morning and Take 40 mg ( 2 Tablets) in the Afternoon. 150 tablet 11    ALLERGIES:   Allergies  Allergen Reactions   Farxiga [Dapagliflozin] Other (See Comments)    DKA   Metformin  And Related Diarrhea    Lost appetite and weight    Benadryl [Diphenhydramine Hcl] Hypertension   Citalopram Other (See Comments)    Unknown    Tramadol  Other (See Comments)    Felt light headed and dizzy   Amlodipine  Swelling   Crestor  [Rosuvastatin ] Swelling and Other (See Comments)    Feet swelling Makes her feel weak. Pt takes 5mg  at home    FAM HX: Family History  Problem Relation Age of Onset   Hypertension Mother    Hyperlipidemia Mother    Stroke Mother    Urticaria Mother    Cancer Father        pancreatic   Colon cancer Father    Heart disease Brother 40       bypass   Heart disease Brother 22       bypass   Arthritis Other    Asthma Other    Diabetes Other    Colon cancer Paternal Aunt    Esophageal  cancer Neg Hx    Stomach cancer Neg Hx    Rectal cancer Neg Hx     Social History:   reports that she has never smoked. She has never been exposed to tobacco smoke. She has never used smokeless tobacco. She reports that she does not drink alcohol and does not use drugs.  ROS: ROS: all other systems reviewed and are negative except as per HPI  Blood pressure (!) 176/60, pulse (!) 58, temperature 98.3 F (36.8 C), resp. rate 18, height 5' (1.524 m), weight 58.9 kg, SpO2 100%. PHYSICAL EXAM: Physical Exam GEN sitting up in chair HEENT EOMI PERRL NECK + JVD PULM bibasilar crackles CV RRR + III/VI murmur ABD soft EXT 3+ LE edema to the thighs NEURO alert SKIN warm and dry   Results for orders placed or performed during the hospital encounter of 04/07/24 (from the past 48 hours)  Brain natriuretic peptide     Status: Abnormal   Collection Time: 04/07/24  2:00 PM  Result Value Ref Range   B Natriuretic Peptide 962.0 (H) 0.0 - 100.0 pg/mL    Comment: Performed at [facility], [address], [place], [place] [address]  Comprehensive metabolic panel     Status: Abnormal   Collection Time: 04/07/24  2:00 PM  Result Value Ref Range   Sodium 119 (LL) 135 - 145 mmol/L    Comment: CRITICAL RESULT CALLED  TO, READ BACK BY AND VERIFIED WITH JOYCE,A ON 04/07/24 AT 1624 BY LOY,C   Potassium 4.1 3.5 - 5.1 mmol/L   Chloride 82 (L) 98 - 111 mmol/L   CO2 27 22 - 32 mmol/L   Glucose, Bld 162 (H) 70 - 99 mg/dL    Comment: Glucose reference range applies only to samples taken after fasting for at least 8 hours.   BUN 51 (H) 8 - 23 mg/dL   Creatinine, Ser 7.33 (H) 0.44 - 1.00 mg/dL   Calcium  8.2 (L) 8.9 - 10.3 mg/dL   Total Protein 5.2 (L) 6.5 - 8.1 g/dL   Albumin  2.9 (L) 3.5 - 5.0 g/dL   AST 23 15 - 41 U/L   ALT 15 0 - 44 U/L   Alkaline Phosphatase 74 38 - 126 U/L   Total Bilirubin 0.7 0.0 - 1.2 mg/dL   GFR, Estimated 17 (L) >60 mL/min    Comment: (NOTE) Calculated using the CKD-EPI  Creatinine Equation (2021)    Anion gap 10 5 - 15    Comment: Performed at [facility], [address], [place], [place] [address]  Troponin I (High Sensitivity)     Status: None   Collection Time: 04/07/24  2:00 PM  Result Value Ref Range   Troponin I (High Sensitivity) 6 <18 ng/L    Comment: (NOTE) Elevated high sensitivity troponin I (hsTnI) values and significant  changes across serial measurements may suggest ACS but many other  chronic and acute conditions are known to elevate hsTnI results.  Refer to the Links section for chest pain algorithms and additional  guidance. Performed at [facility], [address] [place], [place] [address]   CBC with Differential     Status: Abnormal   Collection Time: 04/07/24  2:00 PM  Result Value Ref Range   WBC 3.1 (L) 4.0 - 10.5 K/uL   RBC 2.73 (L) 3.87 - 5.11 MIL/uL   Hemoglobin 8.8 (L) 12.0 - 15.0 g/dL   HCT 75.2 (L) 63.9 - 53.9 %   MCV 90.5 80.0 - 100.0 fL   MCH 32.2 26.0 - 34.0 pg   MCHC 35.6 30.0 - 36.0 g/dL   RDW 86.5 88.4 - 84.4 %   Platelets 163 150 - 400 K/uL   nRBC 0.0 0.0 - 0.2 %   Neutrophils Relative % 78 %   Neutro Abs 2.4 1.7 - 7.7 K/uL   Lymphocytes Relative 12 %   Lymphs Abs 0.4 (L) 0.7 - 4.0 K/uL   Monocytes Relative 9 %   Monocytes Absolute 0.3 0.1 - 1.0 K/uL   Eosinophils Relative 1 %   Eosinophils Absolute 0.0 0.0 - 0.5 K/uL   Basophils Relative 0 %   Basophils Absolute 0.0 0.0 - 0.1 K/uL   Immature Granulocytes 0 %   Abs Immature Granulocytes 0.01 0.00 - 0.07 K/uL    Comment: Performed at [facility], [address], [place], [place] [address]  MRSA Next Gen by PCR, Nasal     Status: None   Collection Time: 04/07/24  5:48 PM   Specimen: Nasal Mucosa; Nasal Swab  Result Value Ref Range   MRSA by PCR Next Gen NOT DETECTED NOT DETECTED    Comment: (NOTE) The GeneXpert MRSA Assay (FDA approved for NASAL specimens only), is one component of a comprehensive MRSA colonization surveillance program. It is not  intended to diagnose MRSA infection nor to guide or monitor treatment for MRSA infections. Test performance is not FDA approved in patients less  than 25 years old. Performed at [facility], [address] [place], [place] [address]   Troponin I (High Sensitivity)     Status: None   Collection Time: 04/07/24  5:50 PM  Result Value Ref Range   Troponin I (High Sensitivity) 7 <18 ng/L    Comment: (NOTE) Elevated high sensitivity troponin I (hsTnI) values and significant  changes across serial measurements may suggest ACS but many other  chronic and acute conditions are known to elevate hsTnI results.  Refer to the Links section for chest pain algorithms and additional  guidance. Performed at [facility], [address], [place], [place] [address]   Glucose, capillary     Status: Abnormal   Collection Time: 04/07/24  8:05 PM  Result Value Ref Range   Glucose-Capillary 204 (H) 70 - 99 mg/dL    Comment: Glucose reference range applies only to samples taken after fasting for at least 8 hours.  Glucose, capillary     Status: Abnormal   Collection Time: 04/07/24 10:11 PM  Result Value Ref Range   Glucose-Capillary 284 (H) 70 - 99 mg/dL    Comment: Glucose reference range applies only to samples taken after fasting for at least 8 hours.  CBC     Status: Abnormal   Collection Time: 04/08/24  4:30 AM  Result Value Ref Range   WBC 2.9 (L) 4.0 - 10.5 K/uL   RBC 2.50 (L) 3.87 - 5.11 MIL/uL   Hemoglobin 7.8 (L) 12.0 - 15.0 g/dL   HCT 77.3 (L) 63.9 - 53.9 %   MCV 90.4 80.0 - 100.0 fL   MCH 31.2 26.0 - 34.0 pg   MCHC 34.5 30.0 - 36.0 g/dL   RDW 86.8 88.4 - 84.4 %   Platelets 129 (L) 150 - 400 K/uL   nRBC 0.0 0.0 - 0.2 %    Comment: Performed at [facility], [address] [place], [place] [address]  Basic metabolic panel     Status: Abnormal   Collection Time: 04/08/24  4:30 AM  Result Value Ref Range   Sodium 123 (L) 135 - 145 mmol/L   Potassium 3.5 3.5 - 5.1 mmol/L   Chloride 86 (L)  98 - 111 mmol/L   CO2 29 22 - 32 mmol/L   Glucose, Bld 205 (H) 70 - 99 mg/dL    Comment: Glucose reference range applies only to samples taken after fasting for at least 8 hours.   BUN 51 (H) 8 - 23 mg/dL   Creatinine, Ser 7.25 (H) 0.44 - 1.00 mg/dL   Calcium  8.0 (L) 8.9 - 10.3 mg/dL   GFR, Estimated 16 (L) >60 mL/min    Comment: (NOTE) Calculated using the CKD-EPI Creatinine Equation (2021)    Anion gap 8 5 - 15    Comment: Performed at [facility], [address], [place], [place] [address]  Glucose, capillary     Status: Abnormal   Collection Time: 04/08/24  7:31 AM  Result Value Ref Range   Glucose-Capillary 168 (H) 70 - 99 mg/dL    Comment: Glucose reference range applies only to samples taken after fasting for at least 8 hours.  Urinalysis, Routine w reflex microscopic -Urine, Clean Catch     Status: Abnormal   Collection Time: 04/08/24  7:57 AM  Result Value Ref Range   Color, Urine STRAW (A) YELLOW   APPearance CLEAR CLEAR   Specific Gravity, Urine 1.006 1.005 - 1.030   pH 7.0 5.0 - 8.0   Glucose, UA >=  500 (A) NEGATIVE mg/dL   Hgb urine dipstick NEGATIVE NEGATIVE   Bilirubin Urine NEGATIVE NEGATIVE   Ketones, ur NEGATIVE NEGATIVE mg/dL   Protein, ur >=699 (A) NEGATIVE mg/dL   Nitrite NEGATIVE NEGATIVE   Leukocytes,Ua NEGATIVE NEGATIVE   RBC / HPF 0-5 0 - 5 RBC/hpf   WBC, UA 0-5 0 - 5 WBC/hpf   Bacteria, UA RARE (A) NONE SEEN   Squamous Epithelial / HPF 0-5 0 - 5 /HPF    Comment: Performed at [facility], [address], [place], [place] [address]  Glucose, capillary     Status: Abnormal   Collection Time: 04/08/24 11:19 AM  Result Value Ref Range   Glucose-Capillary 142 (H) 70 - 99 mg/dL    Comment: Glucose reference range applies only to samples taken after fasting for at least 8 hours.   Comment 1 Notify RN    Comment 2 Document in Chart   Basic metabolic panel     Status: Abnormal   Collection Time: 04/08/24  1:08 PM  Result Value Ref Range   Sodium 123 (L)  135 - 145 mmol/L   Potassium 3.8 3.5 - 5.1 mmol/L   Chloride 84 (L) 98 - 111 mmol/L   CO2 28 22 - 32 mmol/L   Glucose, Bld 155 (H) 70 - 99 mg/dL    Comment: Glucose reference range applies only to samples taken after fasting for at least 8 hours.   BUN 52 (H) 8 - 23 mg/dL   Creatinine, Ser 7.10 (H) 0.44 - 1.00 mg/dL   Calcium  8.0 (L) 8.9 - 10.3 mg/dL   GFR, Estimated 15 (L) >60 mL/min    Comment: (NOTE) Calculated using the CKD-EPI Creatinine Equation (2021)    Anion gap 11 5 - 15    Comment: Performed at [facility], [address] [place], [place] [address]  Glucose, capillary     Status: None   Collection Time: 04/08/24  4:35 PM  Result Value Ref Range   Glucose-Capillary 95 70 - 99 mg/dL    Comment: Glucose reference range applies only to samples taken after fasting for at least 8 hours.   Comment 1 Notify RN    Comment 2 Document in Chart   Glucose, capillary     Status: Abnormal   Collection Time: 04/08/24  9:10 PM  Result Value Ref Range   Glucose-Capillary 194 (H) 70 - 99 mg/dL    Comment: Glucose reference range applies only to samples taken after fasting for at least 8 hours.  Basic metabolic panel     Status: Abnormal   Collection Time: 04/08/24  9:23 PM  Result Value Ref Range   Sodium 123 (L) 135 - 145 mmol/L   Potassium 4.3 3.5 - 5.1 mmol/L   Chloride 83 (L) 98 - 111 mmol/L   CO2 28 22 - 32 mmol/L   Glucose, Bld 194 (H) 70 - 99 mg/dL    Comment: Glucose reference range applies only to samples taken after fasting for at least 8 hours.   BUN 53 (H) 8 - 23 mg/dL   Creatinine, Ser 7.19 (H) 0.44 - 1.00 mg/dL   Calcium  8.1 (L) 8.9 - 10.3 mg/dL   GFR, Estimated 16 (L) >60 mL/min    Comment: (NOTE) Calculated using the CKD-EPI Creatinine Equation (2021)    Anion gap 12 5 - 15    Comment: Performed at [facility], [address], [place], [place] [address]  Basic metabolic panel     Status: Abnormal  Collection Time: 04/09/24  4:15 AM  Result Value Ref Range    Sodium 125 (L) 135 - 145 mmol/L   Potassium 3.4 (L) 3.5 - 5.1 mmol/L   Chloride 87 (L) 98 - 111 mmol/L   CO2 30 22 - 32 mmol/L   Glucose, Bld 117 (H) 70 - 99 mg/dL    Comment: Glucose reference range applies only to samples taken after fasting for at least 8 hours.   BUN 55 (H) 8 - 23 mg/dL   Creatinine, Ser 7.22 (H) 0.44 - 1.00 mg/dL   Calcium  8.0 (L) 8.9 - 10.3 mg/dL   GFR, Estimated 16 (L) >60 mL/min    Comment: (NOTE) Calculated using the CKD-EPI Creatinine Equation (2021)    Anion gap 8 5 - 15    Comment: Performed at [facility], [address], [place], [place] [address]  CBC     Status: Abnormal   Collection Time: 04/09/24  4:15 AM  Result Value Ref Range   WBC 2.9 (L) 4.0 - 10.5 K/uL   RBC 2.51 (L) 3.87 - 5.11 MIL/uL   Hemoglobin 7.9 (L) 12.0 - 15.0 g/dL   HCT 77.1 (L) 63.9 - 53.9 %   MCV 90.8 80.0 - 100.0 fL   MCH 31.5 26.0 - 34.0 pg   MCHC 34.6 30.0 - 36.0 g/dL   RDW 86.8 88.4 - 84.4 %   Platelets 149 (L) 150 - 400 K/uL    Comment: REPEATED TO VERIFY   nRBC 0.0 0.0 - 0.2 %    Comment: Performed at [facility], [address], [place], [place] [address]  Magnesium      Status: None   Collection Time: 04/09/24  4:15 AM  Result Value Ref Range   Magnesium  2.2 1.7 - 2.4 mg/dL    Comment: Performed at [facility], [address], [place], [place] [address]  Glucose, capillary     Status: Abnormal   Collection Time: 04/09/24  7:29 AM  Result Value Ref Range   Glucose-Capillary 113 (H) 70 - 99 mg/dL    Comment: Glucose reference range applies only to samples taken after fasting for at least 8 hours.   *Note: Due to a large number of results and/or encounters for the requested time period, some results have not been displayed. A complete set of results can be found in Results Review.    DG Chest Port 1 View Result Date: 04/07/2024 CLINICAL DATA:  Shortness of breath for several days. EXAM: PORTABLE CHEST 1 VIEW COMPARISON:  03/10/2024 FINDINGS: Stable mild cardiomegaly.  Stable small bilateral pleural effusions. Both lungs are otherwise clear. IMPRESSION: Stable mild cardiomegaly and small bilateral pleural effusions. No new findings. Electronically Signed   By: Norleen DELENA Kil M.D.   On: 04/07/2024 15:43    Assessment/Plan  CKD IV/V: appears to be near/ at baseline, a little better than previous admission in June.    - quite volume overloaded- increase lasix  to 80 IV BID, anticipate that she will need even increased dosing depending on response +/- metolazone  - no uremic symptoms  - appears to be improving already- no indication for HD   - poor dialysis candidate- may need to have ACP discussion sooner rather than later  2.  Anemia:  - add on iron  panel   - suspect anemia of renal disease  - adding aranesp  60 q week  3.  Hyponatremia:  - hypervolemic hyponatremia  - improving  - strict I/O  - added 1200 mL fluid restriction  4: Hypertension  - on multiple meds  - suspect we may be able to peel some off as volume status improves  5.  Metabolic acidosis  - na biCarb  6.  Hypokalemia  - replete prn  7.  Diabetes:  - on insulin   8.  Dispo: admitted  Selim Durden, ALMARIE 04/09/2024, 9:44 AM

## 2024-04-09 NOTE — Plan of Care (Signed)

## 2024-04-09 NOTE — Evaluation (Signed)
 Physical Therapy Evaluation Patient Details Name: Emily Jennings MRN: [id number] DOB: October 29, 1933 Today's Date: 04/09/2024  History of Present Illness  Emily Jennings is a 89 y.o. female with medical history significant for CHF, diabetes mellitus, hypertension, CKD 4.  Patient presented to the ED with complaints of difficulty breathing over the past 2 days, swelling to bilateral lower extremities up to her thighs, swelling of her arms.  She reports weight gain from 128-135 today.  She reports compliance with her medication-torsemide  60/40mg . Torsemide  dose was increased 7/15. Recent hospitalizations for same-most recent 6/23 to 6/25.   Clinical Impression  Patient demonstrates slow labored movement for sitting up at bedside. Patient able to ambulate in the hospital room with heavy reliance on the RW for balance demonstrating slow labored, unsteady movement and is mainly limited by fatigue and decreased activity tolerance. She was kept on 2 LPM O2 throughout the session and O2 saturation held at 96%. Patient tolerated sitting in the chair at the end of the session with chair alarm activated and nurse at bedside. Patient will benefit from continued skilled physical therapy in hospital and recommended venue below to increase strength, balance, endurance for safe ADLs and gait.         If plan is discharge home, recommend the following: Help with stairs or ramp for entrance;Assist for transportation;Assistance with cooking/housework;A little help with walking and/or transfers;A little help with bathing/dressing/bathroom   Can travel by private vehicle   No    Equipment Recommendations None recommended by PT  Recommendations for Other Services       Functional Status Assessment Patient has had a recent decline in their functional status and demonstrates the ability to make significant improvements in function in a reasonable and predictable amount of time.     Precautions / Restrictions  Precautions Precautions: Fall Recall of Precautions/Restrictions: Intact Restrictions Weight Bearing Restrictions Per Provider Order: No      Mobility  Bed Mobility Overal bed mobility: Needs Assistance Bed Mobility: Supine to Sit     Supine to sit: HOB elevated, Min assist     General bed mobility comments: used hand assist to pull to EOB    Transfers Overall transfer level: Needs assistance Equipment used: Rolling walker (2 wheels) Transfers: Sit to/from Stand, Bed to chair/wheelchair/BSC Sit to Stand: Min assist, Mod assist   Step pivot transfers: Min assist       General transfer comment: slow labored movement, unsteady    Ambulation/Gait Ambulation/Gait assistance: Contact guard assist, Min assist Gait Distance (Feet): 15 Feet Assistive device: Rolling walker (2 wheels) Gait Pattern/deviations: Decreased step length - right, Decreased step length - left Gait velocity: slow     General Gait Details: slow labored movement, relies heavily on walker  Stairs            Wheelchair Mobility     Tilt Bed    Modified Rankin (Stroke Patients Only)       Balance Overall balance assessment: Needs assistance Sitting-balance support: Feet supported, No upper extremity supported Sitting balance-Leahy Scale: Good Sitting balance - Comments: fair/good EOB   Standing balance support: Reliant on assistive device for balance, During functional activity, Bilateral upper extremity supported Standing balance-Leahy Scale: Fair Standing balance comment: fair using RW                             Pertinent Vitals/Pain Pain Assessment Pain Assessment: No/denies pain    Home Living Family/patient  expects to be discharged to:: Private residence Living Arrangements: Spouse/significant other;Children Available Help at Discharge: Family;Available 24 hours/day Type of Home: House Home Access: Stairs to enter Entrance Stairs-Rails: None Entrance  Stairs-Number of Steps: 1   Home Layout: One level Home Equipment: Cane - single point;Shower Counsellor (2 wheels) Additional Comments: Reports no change in home set up/support since last admission    Prior Function Prior Level of Function : Independent/Modified Independent             Mobility Comments: household ambulation using SPC or RW, reports son or husband provides CGA ADLs Comments: Reports independence mostly but help available     Extremity/Trunk Assessment        Lower Extremity Assessment Lower Extremity Assessment: Generalized weakness    Cervical / Trunk Assessment Cervical / Trunk Assessment: Kyphotic  Communication   Communication Communication: No apparent difficulties    Cognition Arousal: Alert Behavior During Therapy: WFL for tasks assessed/performed   PT - Cognitive impairments: No apparent impairments, Orientation   Orientation impairments: Situation (Able to self correct)                     Following commands: Intact       Cueing Cueing Techniques: Verbal cues, Tactile cues     General Comments      Exercises     Assessment/Plan    PT Assessment Patient needs continued PT services  PT Problem List Decreased strength;Decreased activity tolerance;Decreased balance;Decreased mobility       PT Treatment Interventions DME instruction;Gait training;Stair training;Functional mobility training;Therapeutic activities;Therapeutic exercise;Balance training;Patient/family education    PT Goals (Current goals can be found in the Care Plan section)  Acute Rehab PT Goals Patient Stated Goal: return home PT Goal Formulation: With patient Time For Goal Achievement: 04/23/24 Potential to Achieve Goals: Good    Frequency Min 3X/week     Co-evaluation               AM-PAC PT 6 Clicks Mobility  Outcome Measure Help needed turning from your back to your side while in a flat bed without using bedrails?: A  Little Help needed moving from lying on your back to sitting on the side of a flat bed without using bedrails?: A Little Help needed moving to and from a bed to a chair (including a wheelchair)?: A Little Help needed standing up from a chair using your arms (e.g., wheelchair or bedside chair)?: A Little Help needed to walk in hospital room?: A Lot Help needed climbing 3-5 steps with a railing? : A Lot 6 Click Score: 16    End of Session Equipment Utilized During Treatment: Gait belt Activity Tolerance: Patient tolerated treatment well;Patient limited by fatigue Patient left: in chair;with nursing/sitter in room;with chair alarm set;with call bell/phone within reach Nurse Communication: Mobility status PT Visit Diagnosis: Unsteadiness on feet (R26.81);Other abnormalities of gait and mobility (R26.89);Muscle weakness (generalized) (M62.81)    Time: 1500-1530 PT Time Calculation (min) (ACUTE ONLY): 30 min   Charges:   PT Evaluation $PT Eval Moderate Complexity: 1 Mod PT Treatments $Therapeutic Activity: 23-37 mins PT General Charges $$ ACUTE PT VISIT: 1 Visit       3:53 PM, 04/09/24,  Rodriguez Aguinaldo, SPT

## 2024-04-09 NOTE — TOC Progression Note (Addendum)
 Transition of Care [facility]) - Progression Note    Patient Details  Name: Emily Jennings MRN: [id number] Date of Birth: [birth year]  [facility] CM/SW Contact  Sharlyne Stabs, RN Phone Number: 04/09/2024, 3:48 PM  Clinical Narrative:   PT is recommending SNF if family can not assist at home. Patient is active with [facility] home [facility]. CM spoke with the patient, she wants to go home but she will let her husband and son decide. CM left a message with her husband to call back to discuss SNF. TOC following.   ADDENDUM:  Spouse called back to discuss discharge plan. They will continue to care for her in the home with [facility] services.  He has family and friends to assist.   Expected Discharge Plan: Home w Home Health Services Barriers to Discharge: Continued Medical Work up        Social Drivers of Health (SDOH) Interventions SDOH Screenings   Food Insecurity: No Food Insecurity (04/07/2024)  Housing: Low Risk  (04/07/2024)  Transportation Needs: No Transportation Needs (04/07/2024)  Utilities: Not At Risk (04/07/2024)  Alcohol Screen: Low Risk  (11/15/2022)  Depression (PHQ2-9): Low Risk  (03/20/2024)  Financial Resource Strain: Low Risk  (12/28/2022)  Physical Activity: Sufficiently Active (11/15/2022)  Social Connections: Socially Integrated (04/07/2024)  Stress: No Stress Concern Present (12/28/2022)  Tobacco Use: Low Risk  (04/07/2024)    Readmission Risk Interventions    04/08/2024   10:49 AM 03/11/2024    8:30 AM 02/05/2024   12:02 PM  Readmission Risk Prevention Plan  Transportation Screening Complete Complete Complete  Medication Review Oceanographer) Complete Complete Complete  PCP or Specialist appointment within 3-5 days of discharge Not Complete  Not Complete  HRI or Home Care Consult Complete Complete Complete  SW Recovery Care/Counseling Consult  Complete Complete  Palliative Care Screening Not Applicable Not Applicable Not Applicable  Skilled Nursing  Facility Not Applicable Complete Complete

## 2024-04-09 NOTE — Progress Notes (Signed)
 Nurse at bedside,patient out of bed to chair at this time.Patient has no c/o pain or discomfort noted.Plan of care on going.

## 2024-04-09 NOTE — Progress Notes (Addendum)
 Progress Note   Patient: Emily Jennings FMW:[id number] DOB: [birth year] DOA: 04/07/2024     2 DOS: the patient was seen and examined on 04/09/2024   Brief hospital admission narrative: JALA DUNDON is a 89 y.o. female with medical history significant for CHF, diabetes mellitus, hypertension, CKD 4. Patient presented to the ED with complaints of difficulty breathing over the past 2 days, swelling to bilateral lower extremities up to her thighs, swelling of her arms.  She reports weight gain from 128-135 today.  She reports compliance with her medication-torsemide  60/40mg . Torsemide  dose was increased 7/15.   Recent hospitalizations for same-most recent 6/23 to 6/25.   ED Course: Temperature 92.8.  Heart rate 56-64.  Respirate rate 11-25.  Blood pressure systolic 143-174.  O2 sat greater than 95% on room air.  BNP 962.  Troponin 6 > 7. Chest x-ray small bilateral pleural effusions. Lasix  60 mg x 1 given =========================================================================   Subjective:  The patient was seen and examined this morning, still requiring 2 L of oxygen, satting 100%, shortness of breath with minimal exertion, elevated blood pressure 186/70, heart rate 58, sodium 125  Denies any chest pain    Assessment and plan  Acute on chronic diastolic heart failure --Continue IV diuretics Lasixinc. To 80 mg IV twice daily  -Follow daily weights, strict I's and O's, Reds clip measurements and low-sodium diet. - Recent 2D echo demonstrating ejection fraction 70 to 75% with grade 2 diastolic dysfunction (no need to be repeated). - Significant fluid overload appreciated on exam and patient has presence of orthopnea.  Hypertensive crisis - Will continue adjusted antihypertensive agents - Increasing nicardipine  90 mg daily - Continue: Coreg  25 mg twice daily, clonidine  0.3 mg 3 times daily, hydralazine  100 mg 3 times daily - On IV Lasix  80 mg twice daily  Type 2 diabetes mellitus with  nephropathy - A1c 7.4 - Continue to follow CBG fluctuation and as hypoglycemic regimen as required. - Continue sliding scale insulin  and the use of Semglee .  Chronic kidney disease stage IV - Currently stable -Continue to follow renal function trend stability especially with ongoing IV diuresis. - Follow electrolytes and replete them as needed. Lab Results  Component Value Date   CREATININE 2.77 (H) 04/09/2024   CREATININE 2.80 (H) 04/08/2024   CREATININE 2.89 (H) 04/08/2024  Nephrology consulted, appreciate close follow-up and observation -Not a candidate for hemodialysis   Hyponatremia - Appears to be associated with fluid overload - Continue treatment with diuretics - Follow electrolytes trend - Patient asymptomatic. - Serum sodium level: 123, 123, 125 today  - Fluid restriction 1200 mL per day  Hypokalemia - Monitoring and repleting orally   Anemia of chronic disease due to chronic kidney disease -Monitoring H&H closely - Follow-up with iron  studies - Nephrology added Aranesp  60 mg q. weekly   Ethics: Chart reviewed, ACP reviewed, patient has goldenrod, patient is DNR Given the above comorbidities, continued decline-poor prognosis noted Consulting palliative care for further discussion regarding goals of care     Physical Exam: Blood pressure (!) 176/60, pulse (!) 58, temperature 98.3 F (36.8 C), resp. rate 18, height 5' (1.524 m), weight 58.9 kg, SpO2 100%.   General:  AAO x 3,  cooperative, no distress;   HEENT:  Normocephalic, PERRL, otherwise with in Normal limits   Neuro:  CNII-XII intact. , normal motor and sensation, reflexes intact   Lungs:   Clear to auscultation BL, Respirations unlabored,  No wheezes / crackles  Cardio:  S1/S2, RRR, No murmure, No Rubs or Gallops   Abdomen:  Soft, non-tender, bowel sounds active all four quadrants, no guarding or peritoneal signs.  Muscular  skeletal:  Limited exam -global generalized weaknesses - in bed,  able to move all 4 extremities,   2+ pulses,  symmetric, +3  pitting edema  Skin:  Dry, warm to touch, negative for any Rashes,  Wounds: Please see nursing documentation  Pressure Injury 02/06/24 Coccyx Medial;Upper Stage 2 -  Partial thickness loss of dermis presenting as a shallow open injury with a red, pink wound bed without slough. 2x2 (Active)  02/06/24 1200  Location: Coccyx  Location Orientation: Medial;Upper  Staging: Stage 2 -  Partial thickness loss of dermis presenting as a shallow open injury with a red, pink wound bed without slough.  Wound Description (Comments): 2x2  DO NOT USE:  Present on Admission:           Data Reviewed:    Latest Ref Rng & Units 04/09/2024    4:15 AM 04/08/2024    4:30 AM 04/07/2024    2:00 PM  CBC  WBC 4.0 - 10.5 K/uL 2.9  2.9  3.1   Hemoglobin 12.0 - 15.0 g/dL 7.9  7.8  8.8   Hematocrit 36.0 - 46.0 % 22.8  22.6  24.7   Platelets 150 - 400 K/uL 149  129  163       Latest Ref Rng & Units 04/09/2024    4:15 AM 04/08/2024    9:23 PM 04/08/2024    1:08 PM  BMP  Glucose 70 - 99 mg/dL 882  805  844   BUN 8 - 23 mg/dL 55  53  52   Creatinine 0.44 - 1.00 mg/dL 7.22  7.19  7.10   Sodium 135 - 145 mmol/L 125  123  123   Potassium 3.5 - 5.1 mmol/L 3.4  4.3  3.8   Chloride 98 - 111 mmol/L 87  83  84   CO2 22 - 32 mmol/L 30  28  28    Calcium  8.9 - 10.3 mg/dL 8.0  8.1  8.0     Family Communication: No family at bedside.  Disposition: Status is: Inpatient Remains inpatient appropriate because: Continue IV diuresis.  Time spent: 55 minutes  Author: Adriana DELENA Grams, MD 04/09/2024 10:58 AM  For on call review [email or web address].

## 2024-04-09 NOTE — Plan of Care (Signed)
  Problem: Acute Rehab PT Goals(only PT should resolve) Goal: Pt Will Go Supine/Side To Sit Outcome: Progressing Flowsheets (Taken 04/09/2024 1554) Pt will go Supine/Side to Sit:  with contact guard assist  with supervision Goal: Patient Will Transfer Sit To/From Stand Outcome: Progressing Flowsheets (Taken 04/09/2024 1554) Patient will transfer sit to/from stand:  with contact guard assist  with supervision Goal: Pt Will Transfer Bed To Chair/Chair To Bed Outcome: Progressing Flowsheets (Taken 04/09/2024 1554) Pt will Transfer Bed to Chair/Chair to Bed:  with supervision  with contact guard assist Goal: Pt Will Ambulate Outcome: Progressing Flowsheets (Taken 04/09/2024 1554) Pt will Ambulate:  with rolling walker  25 feet  with supervision

## 2024-04-09 NOTE — Progress Notes (Signed)
 Nurse at bedside,patient alert to person,place,and situation,a little confused to time.Patient out of bed to chair this am. No c/o pain or discomfort noted.Patient was able to take her medications whole with no issues. Blood pressure 176/60,heart rate 58,Dr Shahmehdi notified.[name]

## 2024-04-09 NOTE — Consult Note (Signed)
 Consultation Note Date: 04/09/2024   Patient Name: Emily Jennings  DOB: 13-Sep-1934  MRN: [id number]  Age / Sex: 89 y.o., female  PCP: Emily Rollene BRAVO, MD Referring Physician: Willette Adriana LABOR, MD  Reason for Consultation:  To determine goals of care, CODE STATUS  HPI/Patient Profile: 89 y.o. female  with past medical history of CHF, DM, CKD4, DM2  admitted on 04/07/2024 with SOB and diffuse swelling. Admitted for IV diuresis and treatment of acute on chronic heart failure and acute on chronic kidney injury. Palliative medicine consulted for goals of care and code status.    Primary Decision Maker PATIENT  Discussion: Chart reviewed including labs, progress notes, imaging from this and previous encounters.  Last ECHO in May- EF 70-75%, mod L ventricular hypertrophy, Grade II diastolic dysfunction. Mild mitral regurgitation. Mild to mod aortic stenosis.  Labs reviewed- Cr is down today at 2.77, BUN is trending up- 55.  ACP documents reviewed- has DNR form in chart as well as GOC documentation stating- No dialysis, no CPR, no artificial life support. Per family medicine note- discussed hospice with her and her husband on 7/6 and they were not interested.  Case discussed with attending Dr. Leanne.  She has had recurrent hospitalizations 5/19-6/14 and 6/23-6/25 for heart failure.  On evaluation Emily Jennings was awake, alert, oriented to her situation. She notes she's not been doing well. No family at bedside.  She was sitting up in bed and eating her lunch- although reports it doesn't really taste good.  She tells me she is swelling again. We discussed her heart failure and her kidney injury.  She reiterated that if her kidneys did not improve she would prefer transition to comfort and would not want dialysis. She confirmed her code status is DNR.  Her goals of care are to continue diuresis and discharge home  where she lives with her husband and has home health services. She would be ok with re hospitalization if she declined again. She is not interested in hospice at this time.   SUMMARY OF RECOMMENDATIONS -Code status- change to DNR- limited interventions -GOC- continue diuresis and d/c home with home health, would not want dialysis- would prefer comfort measures over dialysis if kidneys worsened -Ok with continued hospitalizations- does not want hospice  Code Status/Advance Care Planning:   Code Status: Limited: Do not attempt resuscitation (DNR) -DNR-LIMITED -Do Not Intubate/DNI     Prognosis:   Unable to determine  Discharge Planning: Home with Home Health  Primary Diagnoses: Present on Admission:  (Resolved) Acute on chronic diastolic (congestive) heart failure (HCC)  Acute on chronic congestive heart failure with left ventricular diastolic dysfunction (HCC)  CKD stage 4 due to type 2 diabetes mellitus (HCC)  DNR (do not resuscitate)  Essential hypertension  Hypothermia  Hyponatremia   Review of Systems  Respiratory:  Positive for shortness of breath.     Physical Exam Vitals and nursing note reviewed.  Cardiovascular:     Rate and Rhythm: Normal rate.     Comments: BLE  edema, swelling around bilateral orbits, BUE edema Pulmonary:     Effort: Pulmonary effort is normal.  Musculoskeletal:     Right lower leg: Edema present.     Left lower leg: Edema present.  Neurological:     Mental Status: She is alert and oriented to person, place, and time. Mental status is at baseline.     Vital Signs: BP (!) 176/60 (BP Location: Right Arm)   Pulse (!) 58   Temp 98.3 F (36.8 C)   Resp 18   Ht 5' (1.524 m)   Wt 58.9 kg   SpO2 100%   BMI 25.36 kg/m  Pain Scale: 0-10   Pain Score: 0-No pain   SpO2: SpO2: 100 % O2 Device:SpO2: 100 % O2 Flow Rate: .O2 Flow Rate (L/min): 2 L/min  IO: Intake/output summary:  Intake/Output Summary (Last 24 hours) at 04/09/2024 1220 Last  data filed at 04/09/2024 0900 Gross per 24 hour  Intake 480 ml  Output 1100 ml  Net -620 ml    LBM: Last BM Date : 04/06/24 Baseline Weight: Weight: 59.4 kg Most recent weight: Weight: 58.9 kg       Thank you for this consult. Palliative medicine will continue to follow and assist as needed.  Time Total: 85 mins  Signed by: Cassondra Stain, AGNP-C Palliative Medicine  Time includes:   Preparing to see the patient (e.g., review of tests) Obtaining and/or reviewing separately obtained history Performing a medically necessary appropriate examination and/or evaluation Counseling and educating the patient/family/caregiver Ordering medications, tests, or procedures Referring and communicating with other health care professionals (when not reported separately) Documenting clinical information in the electronic or other health record Independently interpreting results (not reported separately) and communicating results to the patient/family/caregiver Care coordination (not reported separately) Clinical documentation   Please contact Palliative Medicine Team phone at [phone] for questions and concerns.  For individual provider: See Tracey

## 2024-04-10 DIAGNOSIS — I5033 Acute on chronic diastolic (congestive) heart failure: Secondary | ICD-10-CM | POA: Diagnosis not present

## 2024-04-10 LAB — GLUCOSE, CAPILLARY
Glucose-Capillary: 172 mg/dL — ABNORMAL HIGH (ref 70–99)
Glucose-Capillary: 111 mg/dL — ABNORMAL HIGH (ref 70–99)
Glucose-Capillary: 168 mg/dL — ABNORMAL HIGH (ref 70–99)
Glucose-Capillary: 234 mg/dL — ABNORMAL HIGH (ref 70–99)

## 2024-04-10 LAB — BASIC METABOLIC PANEL WITH GFR
Anion gap: 10 (ref 5–15)
BUN: 59 mg/dL — ABNORMAL HIGH (ref 8–23)
CO2: 28 mmol/L (ref 22–32)
Calcium: 8.4 mg/dL — ABNORMAL LOW (ref 8.9–10.3)
Chloride: 88 mmol/L — ABNORMAL LOW (ref 98–111)
Creatinine, Ser: 2.63 mg/dL — ABNORMAL HIGH (ref 0.44–1.00)
GFR, Estimated: 17 mL/min — ABNORMAL LOW (ref >60)
Glucose, Bld: 89 mg/dL (ref 70–99)
Potassium: 3.5 mmol/L (ref 3.5–5.1)
Sodium: 126 mmol/L — ABNORMAL LOW (ref 135–145)

## 2024-04-10 MED ORDER — FUROSEMIDE 10 MG/ML IJ SOLN
120.0000 mg | Freq: Three times a day (TID) | INTRAVENOUS | Status: DC
  Administered 2024-04-10 – 2024-04-13 (×9): 120 mg via INTRAVENOUS
  Filled 2024-04-10: qty 12
  Filled 2024-04-10: qty 120
  Filled 2024-04-10 (×2): qty 12
  Filled 2024-04-10: qty 120
  Filled 2024-04-10 (×8): qty 12

## 2024-04-10 MED ORDER — LABETALOL HCL 200 MG PO TABS
200.0000 mg | ORAL_TABLET | Freq: Two times a day (BID) | ORAL | Status: DC
  Administered 2024-04-10 – 2024-04-13 (×7): 200 mg via ORAL
  Filled 2024-04-10 (×7): qty 1

## 2024-04-10 MED ORDER — NIFEDIPINE ER OSMOTIC RELEASE 30 MG PO TB24
60.0000 mg | ORAL_TABLET | Freq: Every day | ORAL | Status: DC
Start: 2024-04-11 — End: 2024-04-11
  Filled 2024-04-10: qty 2

## 2024-04-10 NOTE — Progress Notes (Signed)
 Progress Note   Patient: Emily Jennings FMW:[id number] DOB: [birth year] DOA: 04/07/2024     3 DOS: the patient was seen and examined on 04/10/2024   Brief hospital admission narrative: Emily Jennings is a 89 y.o. female with medical history significant for CHF, diabetes mellitus, hypertension, CKD 4. Patient presented to the ED with complaints of difficulty breathing over the past 2 days, swelling to bilateral lower extremities up to her thighs, swelling of her arms.  She reports weight gain from 128-135 today.  She reports compliance with her medication-torsemide  60/40mg . Torsemide  dose was increased 7/15.   Recent hospitalizations for same-most recent 6/23 to 6/25.   ED Course: Temperature 92.8.  Heart rate 56-64.  Respirate rate 11-25.  Blood pressure systolic 143-174.  O2 sat greater than 95% on room air.  BNP 962.  Troponin 6 > 7. Chest x-ray small bilateral pleural effusions. Lasix  60 mg x 1 given =========================================================================   Subjective:  The patient was seen and examined this morning, stable still complain shortness of breath at rest Despite the records patient saying she has been urinating well Still having bilateral lower extremity swelling, generalized weakness is Denies any chest pain    Assessment and plan  Acute on chronic diastolic heart failure --Continue IV diuretics Lasix . 80 mg >>> increased to 120 every 8 hours per nephrology   Intake/Output Summary (Last 24 hours) at 04/10/2024 1047 Last data filed at 04/10/2024 0841 Gross per 24 hour  Intake 938.7 ml  Output 1000 ml  Net -61.3 ml   -Follow daily weights, strict I's and O's, Reds clip measurements and low-sodium diet. - Recent 2D echo demonstrating ejection fraction 70 to 75% with grade 2 diastolic dysfunction (no need to be repeated). - Still significant fluid overload appreciated on exam and patient has presence of orthopnea.  Hypertensive crisis - Will continue  adjusted antihypertensive agents - Increasing nicardipine  90 mg daily - Continue:, clonidine  0.3 mg 3 times daily, hydralazine  100 mg 3 times daily Discontinue Coreg , adding labetalol  200 mg twice daily today 04/10/2024 --On IV Lasix  increased to 120 mg IV Q8 hrs   Type 2 diabetes mellitus with nephropathy - A1c 7.4 - Continue to follow CBG fluctuation and as hypoglycemic regimen as required. - Continue sliding scale insulin  and the use of Semglee .  Chronic kidney disease stage IV - Currently stable -Continue to follow renal function trend stability especially with ongoing IV diuresis. - Follow electrolytes and replete them as needed. Lab Results  Component Value Date   CREATININE 2.63 (H) 04/10/2024   CREATININE 2.67 (H) 04/09/2024   CREATININE 2.77 (H) 04/09/2024  Nephrology consulted, appreciate close follow-up and observation -Not a candidate for hemodialysis   Hyponatremia - Appears to be associated with fluid overload - Continue treatment with diuretics - Follow electrolytes trend - Patient asymptomatic. - Serum sodium level: 123, 123, 125, 126 today  - Fluid restriction 1200 mL per day  Hypokalemia - Monitoring and repleting orally   Anemia of chronic disease due to chronic kidney disease -Monitoring H&H closely - Follow-up with iron  studies - Nephrology added Aranesp  60 mg q. weekly   Ethics: Chart reviewed, ACP reviewed, patient has goldenrod, patient is DNR Given the above comorbidities, continued decline-poor prognosis noted Consulting palliative care for further discussion regarding goals of care     =========================================================================Physical Exam: Blood pressure (!) 167/56, pulse 61, temperature 97.6 F (36.4 C), temperature source Oral, resp. rate 16, height 5' (1.524 m), weight 62 kg, SpO2 92%.  General:  AAO x 3,  cooperative, no distress;   HEENT:  Normocephalic, PERRL, otherwise with in Normal limits    Neuro:  CNII-XII intact. , normal motor and sensation, reflexes intact   Lungs:   Clear to auscultation BL, Respirations unlabored,  No wheezes / crackles  Cardio:    S1/S2, RRR, No murmure, No Rubs or Gallops   Abdomen:  Soft, non-tender, bowel sounds active all four quadrants, no guarding or peritoneal signs.  Muscular  skeletal:  Limited exam -global generalized weaknesses - in bed, able to move all 4 extremities,   2+ pulses,  symmetric, No pitting edema  Skin:  Dry, warm to touch, negative for any Rashes,  Wounds: Please see nursing documentation  Pressure Injury 02/06/24 Coccyx Medial;Upper Stage 2 -  Partial thickness loss of dermis presenting as a shallow open injury with a red, pink wound bed without slough. 2x2 (Active)  02/06/24 1200  Location: Coccyx  Location Orientation: Medial;Upper  Staging: Stage 2 -  Partial thickness loss of dermis presenting as a shallow open injury with a red, pink wound bed without slough.  Wound Description (Comments): 2x2  DO NOT USE:  Present on Admission:      Data Reviewed:    Latest Ref Rng & Units 04/09/2024    4:15 AM 04/08/2024    4:30 AM 04/07/2024    2:00 PM  CBC  WBC 4.0 - 10.5 K/uL 2.9  2.9  3.1   Hemoglobin 12.0 - 15.0 g/dL 7.9  7.8  8.8   Hematocrit 36.0 - 46.0 % 22.8  22.6  24.7   Platelets 150 - 400 K/uL 149  129  163       Latest Ref Rng & Units 04/10/2024    8:20 AM 04/09/2024   12:23 PM 04/09/2024    4:15 AM  BMP  Glucose 70 - 99 mg/dL 89  797  882   BUN 8 - 23 mg/dL 59  55  55   Creatinine 0.44 - 1.00 mg/dL 7.36  7.32  7.22   Sodium 135 - 145 mmol/L 126  124  125   Potassium 3.5 - 5.1 mmol/L 3.5  3.6  3.4   Chloride 98 - 111 mmol/L 88  85  87   CO2 22 - 32 mmol/L 28  29  30    Calcium  8.9 - 10.3 mg/dL 8.4  8.3  8.0     Family Communication: No family at bedside.  Disposition: Status is: Inpatient Remains inpatient appropriate because: Continue IV diuresis.  Time spent: 55 minutes  Author: Adriana DELENA Grams, MD 04/10/2024 10:47 AM  For on call review [email or web address].

## 2024-04-10 NOTE — Plan of Care (Signed)

## 2024-04-10 NOTE — Progress Notes (Signed)
 Emily Jennings is an 89 y.o. female with a history of hypertension, hyperlipidemia, CHF, coronary arthrosclerotic heart disease, diabetes, CKD 4 presenting with worsening shortness of breath, leg swelling and increasing weights.  Outpatient torsemide  increased with poor response.  Initial creatinine 2.66 and sodium 114.  Assessment/Plan: CKD IV/V: appears to be near/ at baseline, a little better than previous admission in June.               - quite volume overloaded- increased lasix  to 80 IV BID on 7/23; will increase to 120 mg every 8 hours (response suboptimal).  Still may need metolazone.             - no uremic symptoms             - appears to be improving already- no indication for HD              - poor dialysis candidate- may need to have ACP discussion sooner rather than later   2.  Anemia:             - add on iron  panel                   - suspect anemia of renal disease             - aranesp  60 q week given 7/23   3.  Hyponatremia:             - hypervolemic hyponatremia             - improving             - strict I/O             - added 1200 mL fluid restriction   4: Hypertension             - on multiple meds             - suspect we may be able to peel some off as volume status improves; blood pressure still remains elevated   5.  Metabolic acidosis             - na biCarb   6.  Hypokalemia             - replete prn   7.  Diabetes:             - on insulin   Subjective: States that her breathing does not feel any better but is not worse.  Denies any shortness of breath, chest pain, nausea, vomiting.   Chemistry and CBC: Creat  Date/Time Value Ref Range Status  02/01/2023 01:56 PM 1.70 (H) 0.60 - 0.95 mg/dL Final  89/87/7978 90:83 AM 1.24 (H) 0.60 - 0.88 mg/dL Final    Comment:    For patients >60 years of age, the reference limit for Creatinine is approximately 13% higher for people identified as African-American. [name]   03/24/2020 02:40 PM 0.97 (H) 0.60 -  0.88 mg/dL Final    Comment:    For patients >2 years of age, the reference limit for Creatinine is approximately 13% higher for people identified as African-American. .   08/01/2019 10:07 AM 1.24 (H) 0.60 - 0.88 mg/dL Final    Comment:    For patients >22 years of age, the reference limit for Creatinine is approximately 13% higher for people identified as African-American. [name]   05/27/2019 08:53 AM 1.24 (H) 0.60 - 0.88 mg/dL Final    Comment:  For patients >86 years of age, the reference limit for Creatinine is approximately 13% higher for people identified as African-American. .   04/03/2019 08:52 AM 1.11 (H) 0.60 - 0.88 mg/dL Final    Comment:    For patients >12 years of age, the reference limit for Creatinine is approximately 13% higher for people identified as African-American. [name]   11/25/2018 10:21 AM 1.19 (H) 0.60 - 0.88 mg/dL Final    Comment:    For patients >83 years of age, the reference limit for Creatinine is approximately 13% higher for people identified as African-American. .   08/14/2018 09:45 AM 1.18 (H) 0.60 - 0.88 mg/dL Final    Comment:    For patients >43 years of age, the reference limit for Creatinine is approximately 13% higher for people identified as African-American. .   11/27/2017 09:42 AM 1.24 (H) 0.60 - 0.88 mg/dL Final    Comment:    For patients >55 years of age, the reference limit for Creatinine is approximately 13% higher for people identified as African-American. [name]   05/16/2017 09:02 AM 1.15 (H) 0.60 - 0.88 mg/dL Final    Comment:      For patients > or = 88 years of age: The upper reference limit for Creatinine is approximately 13% higher for people identified as African-American.     01/26/2017 09:41 AM 1.53 (H) 0.60 - 0.88 mg/dL Final    Comment:      For patients > or = 88 years of age: The upper reference limit for Creatinine is approximately 13% higher for people identified as African-American.     10/31/2016  08:45 AM 1.48 (H) 0.60 - 0.88 mg/dL Final    Comment:      For patients > or = 88 years of age: The upper reference limit for Creatinine is approximately 13% higher for people identified as African-American.     07/26/2016 09:14 AM 1.27 (H) 0.60 - 0.88 mg/dL Final    Comment:      For patients > or = 88 years of age: The upper reference limit for Creatinine is approximately 13% higher for people identified as African-American.     04/06/2016 08:31 AM 1.48 (H) 0.60 - 0.88 mg/dL Final    Comment:      For patients > or = 88 years of age: The upper reference limit for Creatinine is approximately 13% higher for people identified as African-American.     12/22/2015 08:45 AM 1.50 (H) 0.60 - 0.93 mg/dL Final  87/71/7983 90:79 AM 1.16 (H) 0.60 - 0.93 mg/dL Final  94/81/7983 90:96 AM 1.08 0.50 - 1.10 mg/dL Final  97/90/7983 87:98 PM 0.98 0.50 - 1.10 mg/dL Final  89/71/7984 98:91 PM 0.98 0.50 - 1.10 mg/dL Final  92/97/7984 95:51 PM 1.26 (H) 0.50 - 1.10 mg/dL Final  98/87/7984 88:49 AM 0.98 0.50 - 1.10 mg/dL Final  89/79/7985 90:58 AM 1.01 0.50 - 1.10 mg/dL Final  93/83/7985 89:64 AM 1.11 (H) 0.50 - 1.10 mg/dL Final  97/87/7985 90:94 AM 1.18 (H) 0.50 - 1.10 mg/dL Final  88/88/7986 88:57 AM 1.31 (H) 0.50 - 1.10 mg/dL Final  92/88/7986 90:94 AM 1.28 (H) 0.50 - 1.10 mg/dL Final  97/71/7986 96:49 PM 1.26 (H) 0.50 - 1.10 mg/dL Final  89/70/7987 98:64 PM 1.12 (H) 0.50 - 1.10 mg/dL Final  91/92/7987 95:49 PM 1.31 (H) 0.50 - 1.10 mg/dL Final  92/76/7987 96:59 PM 1.13 (H) 0.50 - 1.10 mg/dL Final  94/70/7987 87:99 AM 0.89 0.40 - 1.20 mg/dL Final  01/16/2011 12:25 PM 0.97 0.40 - 1.20 mg/dL Final   Creatinine, Ser  Date/Time Value Ref Range Status  04/09/2024 12:23 PM 2.67 (H) 0.44 - 1.00 mg/dL Final  92/76/7974 95:84 AM 2.77 (H) 0.44 - 1.00 mg/dL Final  92/77/7974 90:76 PM 2.80 (H) 0.44 - 1.00 mg/dL Final  92/77/7974 98:91 PM 2.89 (H) 0.44 - 1.00 mg/dL Final  92/77/7974 95:69 AM 2.74 (H)  0.44 - 1.00 mg/dL Final  92/78/7974 97:99 PM 2.66 (H) 0.44 - 1.00 mg/dL Final  92/84/7974 96:51 PM 2.53 (H) 0.44 - 1.00 mg/dL Final  93/74/7974 95:93 AM 2.79 (H) 0.44 - 1.00 mg/dL Final  93/75/7974 95:90 AM 2.73 (H) 0.44 - 1.00 mg/dL Final  93/76/7974 97:52 PM 2.84 (H) 0.44 - 1.00 mg/dL Final  94/71/7974 95:78 AM 2.83 (H) 0.44 - 1.00 mg/dL Final  94/72/7974 95:79 AM 2.77 (H) 0.44 - 1.00 mg/dL Final  94/73/7974 97:57 AM 2.82 (H) 0.44 - 1.00 mg/dL Final  94/74/7974 96:76 AM 3.05 (H) 0.44 - 1.00 mg/dL Final  94/75/7974 96:85 AM 3.10 (H) 0.44 - 1.00 mg/dL Final  94/76/7974 95:80 AM 3.30 (H) 0.44 - 1.00 mg/dL Final  94/77/7974 95:78 AM 3.64 (H) 0.44 - 1.00 mg/dL Final  94/78/7974 96:77 AM 4.06 (H) 0.44 - 1.00 mg/dL Final  94/80/7974 90:78 PM 3.90 (H) 0.44 - 1.00 mg/dL Final  94/80/7974 95:46 PM 4.17 (H) 0.44 - 1.00 mg/dL Final  94/84/7974 95:53 AM 3.50 (H) 0.44 - 1.00 mg/dL Final  94/85/7974 94:89 AM 3.58 (H) 0.44 - 1.00 mg/dL Final  94/86/7974 95:78 AM 3.60 (H) 0.44 - 1.00 mg/dL Final  94/87/7974 96:51 AM 3.17 (H) 0.44 - 1.00 mg/dL Final  94/88/7974 95:86 AM 3.23 (H) 0.44 - 1.00 mg/dL Final  94/89/7974 95:83 AM 2.73 (H) 0.44 - 1.00 mg/dL Final  94/90/7974 94:86 AM 2.93 (H) 0.44 - 1.00 mg/dL Final  94/91/7974 91:52 AM 3.09 (H) 0.44 - 1.00 mg/dL Final  94/92/7974 96:73 AM 3.29 (H) 0.44 - 1.00 mg/dL Final  94/93/7974 98:77 AM 3.70 (H) 0.44 - 1.00 mg/dL Final  96/87/7974 98:92 PM 2.31 (H) 0.57 - 1.00 mg/dL Final  88/91/7975 90:84 AM 2.39 (H) 0.57 - 1.00 mg/dL Final  88/93/7975 95:78 AM 2.58 (H) 0.44 - 1.00 mg/dL Final  88/95/7975 95:51 AM 2.82 (H) 0.44 - 1.00 mg/dL Final  88/97/7975 94:94 AM 3.95 (H) 0.44 - 1.00 mg/dL Final  88/98/7975 91:70 AM 4.69 (H) 0.44 - 1.00 mg/dL Final  89/68/7975 94:40 AM 4.51 (H) 0.44 - 1.00 mg/dL Final  89/69/7975 94:97 PM 4.56 (H) 0.44 - 1.00 mg/dL Final  89/71/7975 90:78 AM 3.09 (H) 0.57 - 1.00 mg/dL Final  89/81/7975 94:99 AM 2.64 (H) 0.44 - 1.00 mg/dL Final   89/82/7975 94:93 AM 2.65 (H) 0.44 - 1.00 mg/dL Final  89/83/7975 87:54 AM 2.97 (H) 0.44 - 1.00 mg/dL Final  89/84/7975 96:42 PM 2.95 (H) 0.44 - 1.00 mg/dL Final  90/69/7975 95:49 AM 2.09 (H) 0.44 - 1.00 mg/dL Final  90/70/7975 91:83 AM 2.05 (H) 0.44 - 1.00 mg/dL Final  90/71/7975 90:40 AM 2.38 (H) 0.44 - 1.00 mg/dL Final  90/71/7975 95:95 AM 2.30 (H) 0.44 - 1.00 mg/dL Final  90/72/7975 89:90 PM 2.42 (H) 0.44 - 1.00 mg/dL Final  90/72/7975 96:43 PM 2.38 (H) 0.44 - 1.00 mg/dL Final  90/72/7975 89:89 AM 2.46 (H) 0.44 - 1.00 mg/dL Final  90/72/7975 94:95 AM 2.41 (H) 0.44 - 1.00 mg/dL Final  90/73/7975 94:88 PM 2.59 (H) 0.44 - 1.00 mg/dL Final   Recent Labs  Lab 04/07/24  1400 04/08/24 0430 04/08/24 1308 04/08/24 2123 04/09/24 0415 04/09/24 1223  NA 119* 123* 123* 123* 125* 124*  K 4.1 3.5 3.8 4.3 3.4* 3.6  CL 82* 86* 84* 83* 87* 85*  CO2 27 29 28 28 30 29   GLUCOSE 162* 205* 155* 194* 117* 202*  BUN 51* 51* 52* 53* 55* 55*  CREATININE 2.66* 2.74* 2.89* 2.80* 2.77* 2.67*  CALCIUM  8.2* 8.0* 8.0* 8.1* 8.0* 8.3*   Recent Labs  Lab 04/07/24 1400 04/08/24 0430 04/09/24 0415  WBC 3.1* 2.9* 2.9*  NEUTROABS 2.4  --   --   HGB 8.8* 7.8* 7.9*  HCT 24.7* 22.6* 22.8*  MCV 90.5 90.4 90.8  PLT 163 129* 149*   Liver Function Tests: Recent Labs  Lab 04/07/24 1400  AST 23  ALT 15  ALKPHOS 74  BILITOT 0.7  PROT 5.2*  ALBUMIN  2.9*   No results for input(s): LIPASE, AMYLASE in the last 168 hours. No results for input(s): AMMONIA in the last 168 hours. Cardiac Enzymes: No results for input(s): CKTOTAL, CKMB, CKMBINDEX, TROPONINI in the last 168 hours. Iron  Studies:  Recent Labs    04/09/24 0947  IRON  60  TIBC 306  FERRITIN 92   PT/INR: @LABRCNTIP (inr:5)  Xrays/Other Studies: ) Results for orders placed or performed during the hospital encounter of 04/07/24 (from the past 48 hours)  Glucose, capillary     Status: Abnormal   Collection Time: 04/08/24 11:19 AM   Result Value Ref Range   Glucose-Capillary 142 (H) 70 - 99 mg/dL    Comment: Glucose reference range applies only to samples taken after fasting for at least 8 hours.   Comment 1 Notify RN    Comment 2 Document in Chart   Basic metabolic panel     Status: Abnormal   Collection Time: 04/08/24  1:08 PM  Result Value Ref Range   Sodium 123 (L) 135 - 145 mmol/L   Potassium 3.8 3.5 - 5.1 mmol/L   Chloride 84 (L) 98 - 111 mmol/L   CO2 28 22 - 32 mmol/L   Glucose, Bld 155 (H) 70 - 99 mg/dL    Comment: Glucose reference range applies only to samples taken after fasting for at least 8 hours.   BUN 52 (H) 8 - 23 mg/dL   Creatinine, Ser 7.10 (H) 0.44 - 1.00 mg/dL   Calcium  8.0 (L) 8.9 - 10.3 mg/dL   GFR, Estimated 15 (L) >60 mL/min    Comment: (NOTE) Calculated using the CKD-EPI Creatinine Equation (2021)    Anion gap 11 5 - 15    Comment: Performed at [facility], [address] [place], [place] [address]  Glucose, capillary     Status: None   Collection Time: 04/08/24  4:35 PM  Result Value Ref Range   Glucose-Capillary 95 70 - 99 mg/dL    Comment: Glucose reference range applies only to samples taken after fasting for at least 8 hours.   Comment 1 Notify RN    Comment 2 Document in Chart   Glucose, capillary     Status: Abnormal   Collection Time: 04/08/24  9:10 PM  Result Value Ref Range   Glucose-Capillary 194 (H) 70 - 99 mg/dL    Comment: Glucose reference range applies only to samples taken after fasting for at least 8 hours.  Basic metabolic panel     Status: Abnormal   Collection Time: 04/08/24  9:23 PM  Result Value Ref Range   Sodium 123 (L) 135 - 145  mmol/L   Potassium 4.3 3.5 - 5.1 mmol/L   Chloride 83 (L) 98 - 111 mmol/L   CO2 28 22 - 32 mmol/L   Glucose, Bld 194 (H) 70 - 99 mg/dL    Comment: Glucose reference range applies only to samples taken after fasting for at least 8 hours.   BUN 53 (H) 8 - 23 mg/dL   Creatinine, Ser 7.19 (H) 0.44 - 1.00 mg/dL   Calcium  8.1  (L) 8.9 - 10.3 mg/dL   GFR, Estimated 16 (L) >60 mL/min    Comment: (NOTE) Calculated using the CKD-EPI Creatinine Equation (2021)    Anion gap 12 5 - 15    Comment: Performed at [facility], [address], [place], [place] [address]  Basic metabolic panel     Status: Abnormal   Collection Time: 04/09/24  4:15 AM  Result Value Ref Range   Sodium 125 (L) 135 - 145 mmol/L   Potassium 3.4 (L) 3.5 - 5.1 mmol/L   Chloride 87 (L) 98 - 111 mmol/L   CO2 30 22 - 32 mmol/L   Glucose, Bld 117 (H) 70 - 99 mg/dL    Comment: Glucose reference range applies only to samples taken after fasting for at least 8 hours.   BUN 55 (H) 8 - 23 mg/dL   Creatinine, Ser 7.22 (H) 0.44 - 1.00 mg/dL   Calcium  8.0 (L) 8.9 - 10.3 mg/dL   GFR, Estimated 16 (L) >60 mL/min    Comment: (NOTE) Calculated using the CKD-EPI Creatinine Equation (2021)    Anion gap 8 5 - 15    Comment: Performed at [facility], [address] [place], [place] [address]  CBC     Status: Abnormal   Collection Time: 04/09/24  4:15 AM  Result Value Ref Range   WBC 2.9 (L) 4.0 - 10.5 K/uL   RBC 2.51 (L) 3.87 - 5.11 MIL/uL   Hemoglobin 7.9 (L) 12.0 - 15.0 g/dL   HCT 77.1 (L) 63.9 - 53.9 %   MCV 90.8 80.0 - 100.0 fL   MCH 31.5 26.0 - 34.0 pg   MCHC 34.6 30.0 - 36.0 g/dL   RDW 86.8 88.4 - 84.4 %   Platelets 149 (L) 150 - 400 K/uL    Comment: REPEATED TO VERIFY   nRBC 0.0 0.0 - 0.2 %    Comment: Performed at [facility], [address], [place], [place] [address]  Magnesium      Status: None   Collection Time: 04/09/24  4:15 AM  Result Value Ref Range   Magnesium  2.2 1.7 - 2.4 mg/dL    Comment: Performed at [facility], [address] [place], [place] [address]  Glucose, capillary     Status: Abnormal   Collection Time: 04/09/24  7:29 AM  Result Value Ref Range   Glucose-Capillary 113 (H) 70 - 99 mg/dL    Comment: Glucose reference range applies only to samples taken after fasting for at least 8 hours.  Iron  and TIBC     Status:  None   Collection Time: 04/09/24  9:47 AM  Result Value Ref Range   Iron  60 28 - 170 ug/dL   TIBC 693 749 - 549 ug/dL   Saturation Ratios 20 10.4 - 31.8 %   UIBC 246 ug/dL    Comment: Performed at [facility], [address] [place], [place] [address]  Ferritin     Status: None   Collection Time: 04/09/24  9:47 AM  Result Value Ref Range  Ferritin 92 11 - 307 ng/mL    Comment: Performed at [facility], [address] [place], [place] [address]  Glucose, capillary     Status: Abnormal   Collection Time: 04/09/24 11:06 AM  Result Value Ref Range   Glucose-Capillary 186 (H) 70 - 99 mg/dL    Comment: Glucose reference range applies only to samples taken after fasting for at least 8 hours.  Basic metabolic panel     Status: Abnormal   Collection Time: 04/09/24 12:23 PM  Result Value Ref Range   Sodium 124 (L) 135 - 145 mmol/L   Potassium 3.6 3.5 - 5.1 mmol/L   Chloride 85 (L) 98 - 111 mmol/L   CO2 29 22 - 32 mmol/L   Glucose, Bld 202 (H) 70 - 99 mg/dL    Comment: Glucose reference range applies only to samples taken after fasting for at least 8 hours.   BUN 55 (H) 8 - 23 mg/dL   Creatinine, Ser 7.32 (H) 0.44 - 1.00 mg/dL   Calcium  8.3 (L) 8.9 - 10.3 mg/dL   GFR, Estimated 17 (L) >60 mL/min    Comment: (NOTE) Calculated using the CKD-EPI Creatinine Equation (2021)    Anion gap 10 5 - 15    Comment: Performed at [facility], [address], [place], [place] [address]  Glucose, capillary     Status: Abnormal   Collection Time: 04/09/24  4:27 PM  Result Value Ref Range   Glucose-Capillary 229 (H) 70 - 99 mg/dL    Comment: Glucose reference range applies only to samples taken after fasting for at least 8 hours.  Glucose, capillary     Status: Abnormal   Collection Time: 04/09/24  8:54 PM  Result Value Ref Range   Glucose-Capillary 190 (H) 70 - 99 mg/dL    Comment: Glucose reference range applies only to samples taken after fasting for at least 8 hours.  Glucose, capillary      Status: Abnormal   Collection Time: 04/10/24  7:32 AM  Result Value Ref Range   Glucose-Capillary 172 (H) 70 - 99 mg/dL    Comment: Glucose reference range applies only to samples taken after fasting for at least 8 hours.   *Note: Due to a large number of results and/or encounters for the requested time period, some results have not been displayed. A complete set of results can be found in Results Review.   No results found.  PMH:   Past Medical History:  Diagnosis Date   Allergy    Anxiety    ANXIETY DISORDER, GENERALIZED 07/15/2007   Qualifier: Diagnosis of  By: Aisha Motes     Arthritis    Cancer [facility]) 2009   breast, carcinoma in situ left   Carcinoma in situ of breast 05/21/2008   Qualifier: Diagnosis of  By: Antonetta MD, Margaret  Diagnosed in 2009, completed 5 year course of tamoxifen, no evidence of recurrence    Carotid stenosis    11/16/2005  mild plaque formation and stenosis proximal right ECA   Cataract    Complication of anesthesia    Coronary artery disease    cardiac catheterization on 03/20/2006  LAD mid 40% stenosis, left circumflex mild 40% stenosis, RCA mid-vessel 40% to 50% lesion   EF 60%   Diabetes mellitus    GERD (gastroesophageal reflux disease)    Hernia, inguinal    left   Hyperglycemia    Hypertension    Insomnia 11/16/2011   Low blood potassium  Non-insulin  dependent type 2 diabetes mellitus (HCC)    Osteoporosis    Shortness of breath    2D Echocardiogram 01/26/2009   EF of greater than 55%, mild MR, mild TR, normal ventricular function   Thickened endometrium 10/26/2017   Noted by gyne in 2017, missed 6 month follow up, referred in 09/2017   Ventricular tachycardia, non-sustained (HCC)    developed during stress test 02/08/2006, spontaneously aborted, mild reversible apical defect    PSH:   Past Surgical History:  Procedure Laterality Date   BOWEL RESECTION Left 07/19/2023   Procedure: SMALL BOWEL RESECTION;  Surgeon: Kallie Manuelita BROCKS,  MD;  Location: [facility];  Service: General;  Laterality: Left;   BREAST LUMPECTOMY Left 2009   Left breast 2009   CATARACT EXTRACTION W/PHACO Left 10/28/2014   Procedure: PHACO EMULSION CATARACT EXTRACTION WITH INTRAOCULAR LENS IMPLANT LEFT EYE (IOC);  Surgeon: Gaither Quan, MD;  Location: [facility] OR;  Service: Ophthalmology;  Laterality: Left;   COLONOSCOPY     cyst removed from left foot     INGUINAL HERNIA REPAIR Left 07/19/2023   Procedure: HERNIA REPAIR FEMORAL INCARCERATED;  Surgeon: Kallie Manuelita BROCKS, MD;  Location: [facility];  Service: General;  Laterality: Left;   REFRACTIVE SURGERY Left     Allergies:  Allergies  Allergen Reactions   Farxiga [Dapagliflozin] Other (See Comments)    DKA   Metformin  And Related Diarrhea    Lost appetite and weight    Benadryl [Diphenhydramine Hcl] Hypertension   Citalopram Other (See Comments)    Unknown    Tramadol  Other (See Comments)    Felt light headed and dizzy   Amlodipine  Swelling   Crestor  [Rosuvastatin ] Swelling and Other (See Comments)    Feet swelling Makes her feel weak. Pt takes 5mg  at home    Medications:   Prior to Admission medications   Medication Sig Start Date End Date Taking? Authorizing Provider  albuterol  (VENTOLIN  HFA) 108 (90 Base) MCG/ACT inhaler Inhale 2 puffs into the lungs every 2 (two) hours as needed for wheezing or shortness of breath. 02/13/24   Pearlean Manus, MD  carvedilol  (COREG ) 12.5 MG tablet Take 1 tablet (12.5 mg total) by mouth 2 (two) times daily with a meal. 02/13/24   Emokpae, Courage, MD  cloNIDine  (CATAPRES ) 0.3 MG tablet Take 1 tablet (0.3 mg total) by mouth 2 (two) times daily. 02/13/24   Pearlean Manus, MD  Continuous Glucose Receiver (DEXCOM G7 RECEIVER) DEVI Use as directed 03/20/24   Antonetta Rollene BRAVO, MD  Continuous Glucose Sensor (DEXCOM G7 SENSOR) MISC Use 1 every 10 days 03/20/24   Antonetta Rollene BRAVO, MD  hydrALAZINE  (APRESOLINE ) 100 MG tablet Take 1 tablet (100 mg total) by mouth 3  (three) times daily. 02/13/24   Pearlean Manus, MD  insulin  degludec (TRESIBA ) 200 UNIT/ML FlexTouch Pen Inject 6 Units into the skin daily at 10 pm. 02/13/24   Pearlean Manus, MD  insulin  lispro (HUMALOG  KWIKPEN) 100 UNIT/ML KwikPen Inject 0-12 Units into the skin 3 (three) times daily. Short acting Humalog  insulin  per sliding scale 0-12 ---:-- Insulin  injection 0-12 Units 0-12 Units Subcutaneous, 3 times daily with meals CBG 70 - 120: 0 unit CBG 121 - 150: 0 unit  CBG 151 - 200: 2 unit CBG 201 - 250: 4 units CBG 251 - 300: 6 units CBG 301 - 350: 8 units  CBG 351 - 400: 10 units  CBG > 400: 12 units 02/13/24   Emokpae, Courage, MD  isosorbide  mononitrate (IMDUR ) 60  MG 24 hr tablet Take 1 tablet (60 mg total) by mouth daily. 02/13/24   Pearlean Manus, MD  magnesium  oxide (MAG-OX) 400 (240 Mg) MG tablet Take 1 tablet (400 mg total) by mouth daily. 02/13/24   Pearlean Manus, MD  NIFEdipine  (PROCARDIA  XL/NIFEDICAL XL) 60 MG 24 hr tablet Take 1 tablet (60 mg total) by mouth daily. 04/01/24   Sheron Lorette GRADE, PA-C  Potassium Chloride  ER 20 MEQ TBCR Take 1 tablet by mouth daily. 02/10/24   [provider]  RESTASIS  0.05 % ophthalmic emulsion Place 1 drop into both eyes 2 (two) times daily. 11/01/23   [provider]  rosuvastatin  (CRESTOR ) 5 MG tablet Take 1 tablet (5 mg total) by mouth daily. 02/13/24   Pearlean Manus, MD  torsemide  (DEMADEX ) 20 MG tablet Take 60 mg ( 3 Tablets) in the Morning and Take 40 mg ( 2 Tablets) in the Afternoon. 04/01/24   Sheron Lorette GRADE, PA-C    Discontinued Meds:   Medications Discontinued During This Encounter  Medication Reason   insulin  degludec (TRESIBA ) 200 UNIT/ML FlexTouch Pen SOPN 6 Units P&T Policy: Therapeutic Substitute   furosemide  (LASIX ) injection 40 mg    hydrALAZINE  (APRESOLINE ) tablet 50 mg    nicardipine  (CARDENE ) 20mg  in 0.86% saline IV infusion (0.1 mg/ml)    carvedilol  (COREG ) tablet 12.5 mg    cloNIDine  (CATAPRES ) tablet  0.3 mg    NIFEdipine  (PROCARDIA -XL/NIFEDICAL-XL) 24 hr tablet 60 mg    furosemide  (LASIX ) injection 40 mg    Chlorhexidine  Gluconate Cloth 2 % PADS 6 each     Social History:  reports that she has never smoked. She has never been exposed to tobacco smoke. She has never used smokeless tobacco. She reports that she does not drink alcohol and does not use drugs.  Family History:   Family History  Problem Relation Age of Onset   Hypertension Mother    Hyperlipidemia Mother    Stroke Mother    Urticaria Mother    Cancer Father        pancreatic   Colon cancer Father    Heart disease Brother 40       bypass   Heart disease Brother 58       bypass   Arthritis Other    Asthma Other    Diabetes Other    Colon cancer Paternal Aunt    Esophageal cancer Neg Hx    Stomach cancer Neg Hx    Rectal cancer Neg Hx     Blood pressure (!) 161/51, pulse 61, temperature 97.6 F (36.4 C), temperature source Oral, resp. rate 16, height 5' (1.524 m), weight 62 kg, SpO2 92%. Physical Exam: GEN supine in bed at a 30 degree angle   HEENT EOMI  NECK + JVD PULM bibasilar crackles CV RRR + III/VI murmur ABD soft EXT 2-3+ LE edema to the thighs NEURO alert SKIN warm and dry   Britnay Magnussen, LYNWOOD ORN, MD 04/10/2024, 8:42 AM

## 2024-04-10 NOTE — Plan of Care (Signed)
  Problem: Clinical Measurements: Goal: Ability to maintain clinical measurements within normal limits will improve Outcome: Progressing Goal: Will remain free from infection Outcome: Progressing Goal: Diagnostic test results will improve Outcome: Progressing Goal: Respiratory complications will improve Outcome: Progressing   Problem: Elimination: Goal: Will not experience complications related to bowel motility Outcome: Progressing Goal: Will not experience complications related to urinary retention Outcome: Progressing

## 2024-04-11 DIAGNOSIS — I5033 Acute on chronic diastolic (congestive) heart failure: Secondary | ICD-10-CM | POA: Diagnosis not present

## 2024-04-11 LAB — GLUCOSE, CAPILLARY
Glucose-Capillary: 181 mg/dL — ABNORMAL HIGH (ref 70–99)
Glucose-Capillary: 167 mg/dL — ABNORMAL HIGH (ref 70–99)
Glucose-Capillary: 229 mg/dL — ABNORMAL HIGH (ref 70–99)
Glucose-Capillary: 172 mg/dL — ABNORMAL HIGH (ref 70–99)

## 2024-04-11 LAB — BASIC METABOLIC PANEL WITH GFR
Anion gap: 8 (ref 5–15)
BUN: 61 mg/dL — ABNORMAL HIGH (ref 8–23)
CO2: 30 mmol/L (ref 22–32)
Calcium: 8.5 mg/dL — ABNORMAL LOW (ref 8.9–10.3)
Chloride: 88 mmol/L — ABNORMAL LOW (ref 98–111)
Creatinine, Ser: 2.53 mg/dL — ABNORMAL HIGH (ref 0.44–1.00)
GFR, Estimated: 18 mL/min — ABNORMAL LOW (ref >60)
Glucose, Bld: 188 mg/dL — ABNORMAL HIGH (ref 70–99)
Potassium: 3.2 mmol/L — ABNORMAL LOW (ref 3.5–5.1)
Sodium: 126 mmol/L — ABNORMAL LOW (ref 135–145)

## 2024-04-11 MED ORDER — SODIUM CHLORIDE 0.9 % IV SOLN
250.0000 mg | Freq: Once | INTRAVENOUS | Status: AC
  Administered 2024-04-11: 250 mg via INTRAVENOUS
  Filled 2024-04-11: qty 12.5

## 2024-04-11 MED ORDER — POTASSIUM CHLORIDE CRYS ER 20 MEQ PO TBCR: 40.0000 meq | EXTENDED_RELEASE_TABLET | Freq: Once | ORAL | Status: DC

## 2024-04-11 MED ORDER — NIFEDIPINE ER OSMOTIC RELEASE 30 MG PO TB24
90.0000 mg | ORAL_TABLET | Freq: Every day | ORAL | Status: DC
  Administered 2024-04-11 – 2024-04-13 (×3): 90 mg via ORAL
  Filled 2024-04-11 (×2): qty 3

## 2024-04-11 MED ORDER — POTASSIUM CHLORIDE CRYS ER 20 MEQ PO TBCR
40.0000 meq | EXTENDED_RELEASE_TABLET | Freq: Two times a day (BID) | ORAL | Status: AC
  Administered 2024-04-11 (×2): 40 meq via ORAL
  Filled 2024-04-11 (×2): qty 2

## 2024-04-11 MED ORDER — METOLAZONE 5 MG PO TABS
2.5000 mg | ORAL_TABLET | Freq: Once | ORAL | Status: AC
  Administered 2024-04-11: 2.5 mg via ORAL
  Filled 2024-04-11: qty 1

## 2024-04-11 NOTE — Plan of Care (Signed)
  Problem: Education: Goal: Knowledge of General Education information will improve Description: Including pain rating scale, medication(s)/side effects and non-pharmacologic comfort measures Outcome: Progressing   Problem: Coping: Goal: Level of anxiety will decrease Outcome: Progressing   Problem: Coping: Goal: Ability to adjust to condition or change in health will improve Outcome: Progressing

## 2024-04-11 NOTE — Plan of Care (Signed)
  Problem: Education: Goal: Knowledge of General Education information will improve Description: Including pain rating scale, medication(s)/side effects and non-pharmacologic comfort measures Outcome: Progressing   Problem: Health Behavior/Discharge Planning: Goal: Ability to manage health-related needs will improve Outcome: Progressing   Problem: Clinical Measurements: Goal: Ability to maintain clinical measurements within normal limits will improve Outcome: Progressing Goal: Will remain free from infection Outcome: Progressing Goal: Respiratory complications will improve Outcome: Progressing Goal: Cardiovascular complication will be avoided Outcome: Progressing   Problem: Activity: Goal: Risk for activity intolerance will decrease Outcome: Progressing   Problem: Nutrition: Goal: Adequate nutrition will be maintained Outcome: Progressing   Problem: Elimination: Goal: Will not experience complications related to urinary retention Outcome: Progressing   Problem: Pain Managment: Goal: General experience of comfort will improve and/or be controlled Outcome: Progressing

## 2024-04-11 NOTE — Progress Notes (Signed)
 Progress Note   Patient: Emily Jennings [id number] DOB: Mar 15, 1934 DOA: 04/07/2024     4 DOS: the patient was seen and examined on 04/11/2024   Brief hospital admission narrative: Emily Jennings is a 89 y.o. female with medical history significant for CHF, diabetes mellitus, hypertension, CKD 4. Patient presented to the ED with complaints of difficulty breathing over the past 2 days, swelling to bilateral lower extremities up to her thighs, swelling of her arms.  She reports weight gain from 128-135 today.  She reports compliance with her medication-torsemide  60/40mg . Torsemide  dose was increased 7/15.   Recent hospitalizations for same-most recent 6/23 to 6/25.   ED Course: Temperature 92.8.  Heart rate 56-64.  Respirate rate 11-25.  Blood pressure systolic 143-174.  O2 sat greater than 95% on room air.  BNP 962.  Troponin 6 > 7. Chest x-ray small bilateral pleural effusions. Lasix  60 mg x 1 given ========================================================================   Subjective:  The patient was seen and examined, diuresing well and no acute distress Reporting improved shortness of breath Remaining on 2 L of oxygen, satting 100% blood pressure still elevated 173/57 Sodium 126, potassium 3.2   Assessment and plan  Acute on chronic diastolic heart failure -Nephrology following closely assisting with diuretics -- Continue Lasix  IV>> increased to 120 every 8 hours per nephrology   Intake/Output Summary (Last 24 hours) at 04/11/2024 1221 Last data filed at 04/11/2024 0900 Gross per 24 hour  Intake 964.55 ml  Output 1900 ml  Net -935.45 ml   -Follow daily weights, strict I's and O's, Reds clip measurements and low-sodium diet. - Recent 2D echo demonstrating ejection fraction 70 to 75% with grade 2 diastolic dysfunction (no need to be repeated). - Still significant fluid overload appreciated on exam and patient has presence of orthopnea.  Hypertensive crisis - Will continue  adjusted antihypertensive agents Blood pressure still elevated 173/57 this morning - Increasing nicardipine  90 mg daily - Continue:, clonidine  0.3 mg 3 times daily, hydralazine  100 mg 3 times daily Discontinue Coreg , adding labetalol  200 mg twice daily  --On IV Lasix  increased to 120 mg IV Q8 hrs   Type 2 diabetes mellitus with nephropathy - A1c 7.4 - Continue to follow CBG fluctuation and as hypoglycemic regimen as required. - Continue sliding scale insulin  and the use of Semglee .  Chronic kidney disease stage IV - Currently stable -improving -Continue to follow renal function trend stability especially with ongoing IV diuresis. - Follow electrolytes and replete them as needed. Lab Results  Component Value Date   CREATININE 2.53 (H) 04/11/2024   CREATININE 2.63 (H) 04/10/2024   CREATININE 2.67 (H) 04/09/2024  Nephrology consulted, appreciate close follow-up and observation -Not a candidate for hemodialysis   Hyponatremia - Appears to be associated with fluid overload - Continue treatment with diuretics - Follow electrolytes trend - Patient asymptomatic. - Serum sodium level: 123, 123, 125, 126 today  - Fluid restriction 1200 mL per day  Hypokalemia - Monitoring and replacing daily   Anemia of chronic disease due to chronic kidney disease -Monitoring H&H closely - Follow-up with iron  studies - Nephrology added Aranesp  60 mg q. weekly   Ethics: Chart reviewed, ACP reviewed, patient has goldenrod, patient is DNR Given the above comorbidities, continued decline-poor prognosis noted Consulting palliative care for further discussion regarding goals of care     =========================================================================Physical Exam: Blood pressure (!) 173/57, pulse 63, temperature 98 F (36.7 C), temperature source Oral, resp. rate 16, height 5' (1.524 m), weight 61 kg,  SpO2 100%.   General:  AAO x 3,  cooperative, no distress;   HEENT:  Normocephalic,  PERRL, otherwise with in Normal limits   Neuro:  CNII-XII intact. , normal motor and sensation, reflexes intact   Lungs:   Clear to auscultation BL, Respirations unlabored,  No wheezes / crackles  Cardio:    S1/S2, RRR, No murmure, No Rubs or Gallops   Abdomen:  Soft, non-tender, bowel sounds active all four quadrants, no guarding or peritoneal signs.  Muscular  skeletal:  Limited exam -global generalized weaknesses - in bed, able to move all 4 extremities,   2+ pulses,  symmetric, +2  pitting edema  Skin:  Dry, warm to touch, negative for any Rashes,  Wounds: Please see nursing documentation  Pressure Injury 02/06/24 Coccyx Medial;Upper Stage 2 -  Partial thickness loss of dermis presenting as a shallow open injury with a red, pink wound bed without slough. 2x2 (Active)  02/06/24 1200  Location: Coccyx  Location Orientation: Medial;Upper  Staging: Stage 2 -  Partial thickness loss of dermis presenting as a shallow open injury with a red, pink wound bed without slough.  Wound Description (Comments): 2x2  DO NOT USE:  Present on Admission:           Data Reviewed:    Latest Ref Rng & Units 04/09/2024    4:15 AM 04/08/2024    4:30 AM 04/07/2024    2:00 PM  CBC  WBC 4.0 - 10.5 K/uL 2.9  2.9  3.1   Hemoglobin 12.0 - 15.0 g/dL 7.9  7.8  8.8   Hematocrit 36.0 - 46.0 % 22.8  22.6  24.7   Platelets 150 - 400 K/uL 149  129  163       Latest Ref Rng & Units 04/11/2024    4:22 AM 04/10/2024    8:20 AM 04/09/2024   12:23 PM  BMP  Glucose 70 - 99 mg/dL 811  89  797   BUN 8 - 23 mg/dL 61  59  55   Creatinine 0.44 - 1.00 mg/dL 7.46  7.36  7.32   Sodium 135 - 145 mmol/L 126  126  124   Potassium 3.5 - 5.1 mmol/L 3.2  3.5  3.6   Chloride 98 - 111 mmol/L 88  88  85   CO2 22 - 32 mmol/L 30  28  29    Calcium  8.9 - 10.3 mg/dL 8.5  8.4  8.3     Family Communication: No family at bedside.  Disposition: Status is: Inpatient Remains inpatient appropriate because: Continue IV  diuresis.  Time spent: 55 minutes  Author: Adriana DELENA Grams, MD 04/11/2024 12:21 PM  For on call review [email or web address].

## 2024-04-11 NOTE — Progress Notes (Signed)
 Emily Jennings is an 89 y.o. female with a history of hypertension, hyperlipidemia, CHF, coronary arthrosclerotic heart disease, diabetes, CKD 4 presenting with worsening shortness of breath, leg swelling and increasing weights.  Outpatient torsemide  increased with poor response.  Initial creatinine 2.66 and sodium 114.  Assessment/Plan: CKD IV/V: appears to be near/ at baseline, a little better than previous admission in June.               - still quite volume overloaded- increased lasix  to 80 IV BID on 7/23; increased to 120 mg every 8 hours on 7/24 with better response. Will continue for another day + one dose of metolazone 2.5mg . May have to decrease Lasix  to 80mg  q8hrs over the weekend if she develops AKI, metabolic alkalosis              - no uremic symptoms             - appears to be improving already- no indication for HD              - poor dialysis candidate- may need to have ACP discussion sooner rather than later   2.  Anemia:             - TSAT 20% F92 -> will load with IV iron               - suspect anemia of renal disease             - aranesp  60 q week given 7/23   3.  Hyponatremia:             - hypervolemic hyponatremia             - improving             - strict I/O             - added 1200 mL fluid restriction   4: Hypertension             - on multiple meds             - suspect we may be able to peel some off as volume status improves; blood pressure still remains elevated   5.  Metabolic acidosis             - na biCarb   6.  Hypokalemia             - replete prn   7.  Diabetes:             - on insulin   Subjective: States that her breathing is a little better.  Denies any shortness of breath, chest pain, nausea, vomiting.   Chemistry and CBC: Creat  Date/Time Value Ref Range Status  02/01/2023 01:56 PM 1.70 (H) 0.60 - 0.95 mg/dL Final  89/87/7978 90:83 AM 1.24 (H) 0.60 - 0.88 mg/dL Final    Comment:    For patients >63 years of age, the reference  limit for Creatinine is approximately 13% higher for people identified as African-American. [name]   03/24/2020 02:40 PM 0.97 (H) 0.60 - 0.88 mg/dL Final    Comment:    For patients >58 years of age, the reference limit for Creatinine is approximately 13% higher for people identified as African-American. .   08/01/2019 10:07 AM 1.24 (H) 0.60 - 0.88 mg/dL Final    Comment:    For patients >34 years of age, the reference limit for Creatinine is approximately 13% higher for  people identified as African-American. [name]   05/27/2019 08:53 AM 1.24 (H) 0.60 - 0.88 mg/dL Final    Comment:    For patients >65 years of age, the reference limit for Creatinine is approximately 13% higher for people identified as African-American. .   04/03/2019 08:52 AM 1.11 (H) 0.60 - 0.88 mg/dL Final    Comment:    For patients >12 years of age, the reference limit for Creatinine is approximately 13% higher for people identified as African-American. [name]   11/25/2018 10:21 AM 1.19 (H) 0.60 - 0.88 mg/dL Final    Comment:    For patients >47 years of age, the reference limit for Creatinine is approximately 13% higher for people identified as African-American. .   08/14/2018 09:45 AM 1.18 (H) 0.60 - 0.88 mg/dL Final    Comment:    For patients >69 years of age, the reference limit for Creatinine is approximately 13% higher for people identified as African-American. .   11/27/2017 09:42 AM 1.24 (H) 0.60 - 0.88 mg/dL Final    Comment:    For patients >32 years of age, the reference limit for Creatinine is approximately 13% higher for people identified as African-American. [name]   05/16/2017 09:02 AM 1.15 (H) 0.60 - 0.88 mg/dL Final    Comment:      For patients > or = 88 years of age: The upper reference limit for Creatinine is approximately 13% higher for people identified as African-American.     01/26/2017 09:41 AM 1.53 (H) 0.60 - 0.88 mg/dL Final    Comment:      For patients > or = 88 years of  age: The upper reference limit for Creatinine is approximately 13% higher for people identified as African-American.     10/31/2016 08:45 AM 1.48 (H) 0.60 - 0.88 mg/dL Final    Comment:      For patients > or = 88 years of age: The upper reference limit for Creatinine is approximately 13% higher for people identified as African-American.     07/26/2016 09:14 AM 1.27 (H) 0.60 - 0.88 mg/dL Final    Comment:      For patients > or = 88 years of age: The upper reference limit for Creatinine is approximately 13% higher for people identified as African-American.     04/06/2016 08:31 AM 1.48 (H) 0.60 - 0.88 mg/dL Final    Comment:      For patients > or = 88 years of age: The upper reference limit for Creatinine is approximately 13% higher for people identified as African-American.     12/22/2015 08:45 AM 1.50 (H) 0.60 - 0.93 mg/dL Final  87/71/7983 90:79 AM 1.16 (H) 0.60 - 0.93 mg/dL Final  94/81/7983 90:96 AM 1.08 0.50 - 1.10 mg/dL Final  97/90/7983 87:98 PM 0.98 0.50 - 1.10 mg/dL Final  89/71/7984 98:91 PM 0.98 0.50 - 1.10 mg/dL Final  92/97/7984 95:51 PM 1.26 (H) 0.50 - 1.10 mg/dL Final  98/87/7984 88:49 AM 0.98 0.50 - 1.10 mg/dL Final  89/79/7985 90:58 AM 1.01 0.50 - 1.10 mg/dL Final  93/83/7985 89:64 AM 1.11 (H) 0.50 - 1.10 mg/dL Final  97/87/7985 90:94 AM 1.18 (H) 0.50 - 1.10 mg/dL Final  88/88/7986 88:57 AM 1.31 (H) 0.50 - 1.10 mg/dL Final  92/88/7986 90:94 AM 1.28 (H) 0.50 - 1.10 mg/dL Final  97/71/7986 96:49 PM 1.26 (H) 0.50 - 1.10 mg/dL Final  89/70/7987 98:64 PM 1.12 (H) 0.50 - 1.10 mg/dL Final  91/92/7987 95:49 PM 1.31 (H) 0.50 - 1.10  mg/dL Final  92/76/7987 96:59 PM 1.13 (H) 0.50 - 1.10 mg/dL Final  94/70/7987 87:99 AM 0.89 0.40 - 1.20 mg/dL Final  95/69/7987 87:74 PM 0.97 0.40 - 1.20 mg/dL Final   Creatinine, Ser  Date/Time Value Ref Range Status  04/11/2024 04:22 AM 2.53 (H) 0.44 - 1.00 mg/dL Final  92/75/7974 91:79 AM 2.63 (H) 0.44 - 1.00 mg/dL Final   92/76/7974 87:76 PM 2.67 (H) 0.44 - 1.00 mg/dL Final  92/76/7974 95:84 AM 2.77 (H) 0.44 - 1.00 mg/dL Final  92/77/7974 90:76 PM 2.80 (H) 0.44 - 1.00 mg/dL Final  92/77/7974 98:91 PM 2.89 (H) 0.44 - 1.00 mg/dL Final  92/77/7974 95:69 AM 2.74 (H) 0.44 - 1.00 mg/dL Final  92/78/7974 97:99 PM 2.66 (H) 0.44 - 1.00 mg/dL Final  92/84/7974 96:51 PM 2.53 (H) 0.44 - 1.00 mg/dL Final  93/74/7974 95:93 AM 2.79 (H) 0.44 - 1.00 mg/dL Final  93/75/7974 95:90 AM 2.73 (H) 0.44 - 1.00 mg/dL Final  93/76/7974 97:52 PM 2.84 (H) 0.44 - 1.00 mg/dL Final  94/71/7974 95:78 AM 2.83 (H) 0.44 - 1.00 mg/dL Final  94/72/7974 95:79 AM 2.77 (H) 0.44 - 1.00 mg/dL Final  94/73/7974 97:57 AM 2.82 (H) 0.44 - 1.00 mg/dL Final  94/74/7974 96:76 AM 3.05 (H) 0.44 - 1.00 mg/dL Final  94/75/7974 96:85 AM 3.10 (H) 0.44 - 1.00 mg/dL Final  94/76/7974 95:80 AM 3.30 (H) 0.44 - 1.00 mg/dL Final  94/77/7974 95:78 AM 3.64 (H) 0.44 - 1.00 mg/dL Final  94/78/7974 96:77 AM 4.06 (H) 0.44 - 1.00 mg/dL Final  94/80/7974 90:78 PM 3.90 (H) 0.44 - 1.00 mg/dL Final  94/80/7974 95:46 PM 4.17 (H) 0.44 - 1.00 mg/dL Final  94/84/7974 95:53 AM 3.50 (H) 0.44 - 1.00 mg/dL Final  94/85/7974 94:89 AM 3.58 (H) 0.44 - 1.00 mg/dL Final  94/86/7974 95:78 AM 3.60 (H) 0.44 - 1.00 mg/dL Final  94/87/7974 96:51 AM 3.17 (H) 0.44 - 1.00 mg/dL Final  94/88/7974 95:86 AM 3.23 (H) 0.44 - 1.00 mg/dL Final  94/89/7974 95:83 AM 2.73 (H) 0.44 - 1.00 mg/dL Final  94/90/7974 94:86 AM 2.93 (H) 0.44 - 1.00 mg/dL Final  94/91/7974 91:52 AM 3.09 (H) 0.44 - 1.00 mg/dL Final  94/92/7974 96:73 AM 3.29 (H) 0.44 - 1.00 mg/dL Final  94/93/7974 98:77 AM 3.70 (H) 0.44 - 1.00 mg/dL Final  96/87/7974 98:92 PM 2.31 (H) 0.57 - 1.00 mg/dL Final  88/91/7975 90:84 AM 2.39 (H) 0.57 - 1.00 mg/dL Final  88/93/7975 95:78 AM 2.58 (H) 0.44 - 1.00 mg/dL Final  88/95/7975 95:51 AM 2.82 (H) 0.44 - 1.00 mg/dL Final  88/97/7975 94:94 AM 3.95 (H) 0.44 - 1.00 mg/dL Final  88/98/7975 91:70 AM  4.69 (H) 0.44 - 1.00 mg/dL Final  89/68/7975 94:40 AM 4.51 (H) 0.44 - 1.00 mg/dL Final  89/69/7975 94:97 PM 4.56 (H) 0.44 - 1.00 mg/dL Final  89/71/7975 90:78 AM 3.09 (H) 0.57 - 1.00 mg/dL Final  89/81/7975 94:99 AM 2.64 (H) 0.44 - 1.00 mg/dL Final  89/82/7975 94:93 AM 2.65 (H) 0.44 - 1.00 mg/dL Final  89/83/7975 87:54 AM 2.97 (H) 0.44 - 1.00 mg/dL Final  89/84/7975 96:42 PM 2.95 (H) 0.44 - 1.00 mg/dL Final  90/69/7975 95:49 AM 2.09 (H) 0.44 - 1.00 mg/dL Final  90/70/7975 91:83 AM 2.05 (H) 0.44 - 1.00 mg/dL Final  90/71/7975 90:40 AM 2.38 (H) 0.44 - 1.00 mg/dL Final  90/71/7975 95:95 AM 2.30 (H) 0.44 - 1.00 mg/dL Final  90/72/7975 89:90 PM 2.42 (H) 0.44 - 1.00 mg/dL Final  90/72/7975 96:43 PM 2.38 (  H) 0.44 - 1.00 mg/dL Final  90/72/7975 89:89 AM 2.46 (H) 0.44 - 1.00 mg/dL Final   Recent Labs  Lab 04/08/24 0430 04/08/24 1308 04/08/24 2123 04/09/24 0415 04/09/24 1223 04/10/24 0820 04/11/24 0422  NA 123* 123* 123* 125* 124* 126* 126*  K 3.5 3.8 4.3 3.4* 3.6 3.5 3.2*  CL 86* 84* 83* 87* 85* 88* 88*  CO2 29 28 28 30 29 28 30   GLUCOSE 205* 155* 194* 117* 202* 89 188*  BUN 51* 52* 53* 55* 55* 59* 61*  CREATININE 2.74* 2.89* 2.80* 2.77* 2.67* 2.63* 2.53*  CALCIUM  8.0* 8.0* 8.1* 8.0* 8.3* 8.4* 8.5*   Recent Labs  Lab 04/07/24 1400 04/08/24 0430 04/09/24 0415  WBC 3.1* 2.9* 2.9*  NEUTROABS 2.4  --   --   HGB 8.8* 7.8* 7.9*  HCT 24.7* 22.6* 22.8*  MCV 90.5 90.4 90.8  PLT 163 129* 149*   Liver Function Tests: Recent Labs  Lab 04/07/24 1400  AST 23  ALT 15  ALKPHOS 74  BILITOT 0.7  PROT 5.2*  ALBUMIN  2.9*   No results for input(s): LIPASE, AMYLASE in the last 168 hours. No results for input(s): AMMONIA in the last 168 hours. Cardiac Enzymes: No results for input(s): CKTOTAL, CKMB, CKMBINDEX, TROPONINI in the last 168 hours. Iron  Studies:  Recent Labs    04/09/24 0947  IRON  60  TIBC 306  FERRITIN 92   PT/INR: @LABRCNTIP (inr:5)  Xrays/Other  Studies: ) Results for orders placed or performed during the hospital encounter of 04/07/24 (from the past 48 hours)  Iron  and TIBC     Status: None   Collection Time: 04/09/24  9:47 AM  Result Value Ref Range   Iron  60 28 - 170 ug/dL   TIBC 693 749 - 549 ug/dL   Saturation Ratios 20 10.4 - 31.8 %   UIBC 246 ug/dL    Comment: Performed at [facility], [address], [place], [place] [address]  Ferritin     Status: None   Collection Time: 04/09/24  9:47 AM  Result Value Ref Range   Ferritin 92 11 - 307 ng/mL    Comment: Performed at [facility], [address] [place], [place] [address]  Glucose, capillary     Status: Abnormal   Collection Time: 04/09/24 11:06 AM  Result Value Ref Range   Glucose-Capillary 186 (H) 70 - 99 mg/dL    Comment: Glucose reference range applies only to samples taken after fasting for at least 8 hours.  Basic metabolic panel     Status: Abnormal   Collection Time: 04/09/24 12:23 PM  Result Value Ref Range   Sodium 124 (L) 135 - 145 mmol/L   Potassium 3.6 3.5 - 5.1 mmol/L   Chloride 85 (L) 98 - 111 mmol/L   CO2 29 22 - 32 mmol/L   Glucose, Bld 202 (H) 70 - 99 mg/dL    Comment: Glucose reference range applies only to samples taken after fasting for at least 8 hours.   BUN 55 (H) 8 - 23 mg/dL   Creatinine, Ser 7.32 (H) 0.44 - 1.00 mg/dL   Calcium  8.3 (L) 8.9 - 10.3 mg/dL   GFR, Estimated 17 (L) >60 mL/min    Comment: (NOTE) Calculated using the CKD-EPI Creatinine Equation (2021)    Anion gap 10 5 - 15    Comment: Performed at [facility], [address], [place], [place] [address]  Glucose, capillary     Status: Abnormal   Collection Time: 04/09/24  4:27 PM  Result Value Ref Range   Glucose-Capillary 229 (H) 70 - 99 mg/dL    Comment: Glucose reference range applies only to samples taken after fasting for at least 8 hours.  Glucose, capillary     Status: Abnormal   Collection Time: 04/09/24  8:54 PM  Result Value Ref Range   Glucose-Capillary 190  (H) 70 - 99 mg/dL    Comment: Glucose reference range applies only to samples taken after fasting for at least 8 hours.  Glucose, capillary     Status: Abnormal   Collection Time: 04/10/24  7:32 AM  Result Value Ref Range   Glucose-Capillary 172 (H) 70 - 99 mg/dL    Comment: Glucose reference range applies only to samples taken after fasting for at least 8 hours.  Basic metabolic panel     Status: Abnormal   Collection Time: 04/10/24  8:20 AM  Result Value Ref Range   Sodium 126 (L) 135 - 145 mmol/L   Potassium 3.5 3.5 - 5.1 mmol/L   Chloride 88 (L) 98 - 111 mmol/L   CO2 28 22 - 32 mmol/L   Glucose, Bld 89 70 - 99 mg/dL    Comment: Glucose reference range applies only to samples taken after fasting for at least 8 hours.   BUN 59 (H) 8 - 23 mg/dL   Creatinine, Ser 7.36 (H) 0.44 - 1.00 mg/dL   Calcium  8.4 (L) 8.9 - 10.3 mg/dL   GFR, Estimated 17 (L) >60 mL/min    Comment: (NOTE) Calculated using the CKD-EPI Creatinine Equation (2021)    Anion gap 10 5 - 15    Comment: Performed at [facility], [address] [place], [place] [address]  Glucose, capillary     Status: Abnormal   Collection Time: 04/10/24 11:16 AM  Result Value Ref Range   Glucose-Capillary 111 (H) 70 - 99 mg/dL    Comment: Glucose reference range applies only to samples taken after fasting for at least 8 hours.  Glucose, capillary     Status: Abnormal   Collection Time: 04/10/24  4:08 PM  Result Value Ref Range   Glucose-Capillary 168 (H) 70 - 99 mg/dL    Comment: Glucose reference range applies only to samples taken after fasting for at least 8 hours.  Glucose, capillary     Status: Abnormal   Collection Time: 04/10/24  8:10 PM  Result Value Ref Range   Glucose-Capillary 234 (H) 70 - 99 mg/dL    Comment: Glucose reference range applies only to samples taken after fasting for at least 8 hours.  Basic metabolic panel     Status: Abnormal   Collection Time: 04/11/24  4:22 AM  Result Value Ref Range   Sodium 126  (L) 135 - 145 mmol/L   Potassium 3.2 (L) 3.5 - 5.1 mmol/L   Chloride 88 (L) 98 - 111 mmol/L   CO2 30 22 - 32 mmol/L   Glucose, Bld 188 (H) 70 - 99 mg/dL    Comment: Glucose reference range applies only to samples taken after fasting for at least 8 hours.   BUN 61 (H) 8 - 23 mg/dL   Creatinine, Ser 7.46 (H) 0.44 - 1.00 mg/dL   Calcium  8.5 (L) 8.9 - 10.3 mg/dL   GFR, Estimated 18 (L) >60 mL/min    Comment: (NOTE) Calculated using the CKD-EPI Creatinine Equation (2021)    Anion gap 8 5 - 15    Comment: Performed at [facility], [address], [place],  [place] [address]  Glucose, capillary     Status: Abnormal   Collection Time: 04/11/24  7:05 AM  Result Value Ref Range   Glucose-Capillary 181 (H) 70 - 99 mg/dL    Comment: Glucose reference range applies only to samples taken after fasting for at least 8 hours.   *Note: Due to a large number of results and/or encounters for the requested time period, some results have not been displayed. A complete set of results can be found in Results Review.   No results found.  PMH:   Past Medical History:  Diagnosis Date   Allergy    Anxiety    ANXIETY DISORDER, GENERALIZED 07/15/2007   Qualifier: Diagnosis of  By: Aisha Motes     Arthritis    Cancer [facility]) 2009   breast, carcinoma in situ left   Carcinoma in situ of breast 05/21/2008   Qualifier: Diagnosis of  By: Antonetta MD, Margaret  Diagnosed in 2009, completed 5 year course of tamoxifen, no evidence of recurrence    Carotid stenosis    11/16/2005  mild plaque formation and stenosis proximal right ECA   Cataract    Complication of anesthesia    Coronary artery disease    cardiac catheterization on 03/20/2006  LAD mid 40% stenosis, left circumflex mild 40% stenosis, RCA mid-vessel 40% to 50% lesion   EF 60%   Diabetes mellitus    GERD (gastroesophageal reflux disease)    Hernia, inguinal    left   Hyperglycemia    Hypertension    Insomnia 11/16/2011   Low blood potassium     Non-insulin  dependent type 2 diabetes mellitus (HCC)    Osteoporosis    Shortness of breath    2D Echocardiogram 01/26/2009   EF of greater than 55%, mild MR, mild TR, normal ventricular function   Thickened endometrium 10/26/2017   Noted by gyne in 2017, missed 6 month follow up, referred in 09/2017   Ventricular tachycardia, non-sustained (HCC)    developed during stress test 02/08/2006, spontaneously aborted, mild reversible apical defect    PSH:   Past Surgical History:  Procedure Laterality Date   BOWEL RESECTION Left 07/19/2023   Procedure: SMALL BOWEL RESECTION;  Surgeon: Kallie Manuelita BROCKS, MD;  Location: [facility];  Service: General;  Laterality: Left;   BREAST LUMPECTOMY Left 2009   Left breast 2009   CATARACT EXTRACTION W/PHACO Left 10/28/2014   Procedure: PHACO EMULSION CATARACT EXTRACTION WITH INTRAOCULAR LENS IMPLANT LEFT EYE (IOC);  Surgeon: Gaither Quan, MD;  Location[facility];  Service: Ophthalmology;  Laterality: Left;   COLONOSCOPY     cyst removed from left foot     INGUINAL HERNIA REPAIR Left 07/19/2023   Procedure: HERNIA REPAIR FEMORAL INCARCERATED;  Surgeon: Kallie Manuelita BROCKS, MD;  Location: [facility];  Service: General;  Laterality: Left;   REFRACTIVE SURGERY Left     Allergies:  Allergies  Allergen Reactions   Farxiga [Dapagliflozin] Other (See Comments)    DKA   Metformin  And Related Diarrhea    Lost appetite and weight    Benadryl [Diphenhydramine Hcl] Hypertension   Citalopram Other (See Comments)    Unknown    Tramadol  Other (See Comments)    Felt light headed and dizzy   Amlodipine  Swelling   Crestor  [Rosuvastatin ] Swelling and Other (See Comments)    Feet swelling Makes her feel weak. Pt takes 5mg  at home    Medications:   Prior to Admission medications   Medication Sig Start Date End Date  Taking? Authorizing Provider  albuterol  (VENTOLIN  HFA) 108 (90 Base) MCG/ACT inhaler Inhale 2 puffs into the lungs every 2 (two) hours as needed for wheezing  or shortness of breath. 02/13/24   Pearlean Manus, MD  carvedilol  (COREG ) 12.5 MG tablet Take 1 tablet (12.5 mg total) by mouth 2 (two) times daily with a meal. 02/13/24   Emokpae, Courage, MD  cloNIDine  (CATAPRES ) 0.3 MG tablet Take 1 tablet (0.3 mg total) by mouth 2 (two) times daily. 02/13/24   Pearlean Manus, MD  Continuous Glucose Receiver (DEXCOM G7 RECEIVER) DEVI Use as directed 03/20/24   Antonetta Rollene BRAVO, MD  Continuous Glucose Sensor (DEXCOM G7 SENSOR) MISC Use 1 every 10 days 03/20/24   Antonetta Rollene BRAVO, MD  hydrALAZINE  (APRESOLINE ) 100 MG tablet Take 1 tablet (100 mg total) by mouth 3 (three) times daily. 02/13/24   Pearlean Manus, MD  insulin  degludec (TRESIBA ) 200 UNIT/ML FlexTouch Pen Inject 6 Units into the skin daily at 10 pm. 02/13/24   Pearlean Manus, MD  insulin  lispro (HUMALOG  KWIKPEN) 100 UNIT/ML KwikPen Inject 0-12 Units into the skin 3 (three) times daily. Short acting Humalog  insulin  per sliding scale 0-12 ---:-- Insulin  injection 0-12 Units 0-12 Units Subcutaneous, 3 times daily with meals CBG 70 - 120: 0 unit CBG 121 - 150: 0 unit  CBG 151 - 200: 2 unit CBG 201 - 250: 4 units CBG 251 - 300: 6 units CBG 301 - 350: 8 units  CBG 351 - 400: 10 units  CBG > 400: 12 units 02/13/24   Emokpae, Courage, MD  isosorbide  mononitrate (IMDUR ) 60 MG 24 hr tablet Take 1 tablet (60 mg total) by mouth daily. 02/13/24   Pearlean Manus, MD  magnesium  oxide (MAG-OX) 400 (240 Mg) MG tablet Take 1 tablet (400 mg total) by mouth daily. 02/13/24   Pearlean Manus, MD  NIFEdipine  (PROCARDIA  XL/NIFEDICAL XL) 60 MG 24 hr tablet Take 1 tablet (60 mg total) by mouth daily. 04/01/24   Sheron Lorette GRADE, PA-C  Potassium Chloride  ER 20 MEQ TBCR Take 1 tablet by mouth daily. 02/10/24   [provider]  RESTASIS  0.05 % ophthalmic emulsion Place 1 drop into both eyes 2 (two) times daily. 11/01/23   [provider]  rosuvastatin  (CRESTOR ) 5 MG tablet Take 1 tablet (5 mg total) by mouth daily.  02/13/24   Pearlean Manus, MD  torsemide  (DEMADEX ) 20 MG tablet Take 60 mg ( 3 Tablets) in the Morning and Take 40 mg ( 2 Tablets) in the Afternoon. 04/01/24   Sheron Lorette GRADE, PA-C    Discontinued Meds:   Medications Discontinued During This Encounter  Medication Reason   insulin  degludec (TRESIBA ) 200 UNIT/ML FlexTouch Pen SOPN 6 Units P&T Policy: Therapeutic Substitute   furosemide  (LASIX ) injection 40 mg    hydrALAZINE  (APRESOLINE ) tablet 50 mg    nicardipine  (CARDENE ) 20mg  in 0.86% saline IV infusion (0.1 mg/ml)    carvedilol  (COREG ) tablet 12.5 mg    cloNIDine  (CATAPRES ) tablet 0.3 mg    NIFEdipine  (PROCARDIA -XL/NIFEDICAL-XL) 24 hr tablet 60 mg    furosemide  (LASIX ) injection 40 mg    Chlorhexidine  Gluconate Cloth 2 % PADS 6 each    furosemide  (LASIX ) injection 80 mg    carvedilol  (COREG ) tablet 25 mg    NIFEdipine  (PROCARDIA -XL/NIFEDICAL-XL) 24 hr tablet 90 mg    NIFEdipine  (PROCARDIA -XL/NIFEDICAL-XL) 24 hr tablet 60 mg     Social History:  reports that she has never smoked. She has never been exposed to tobacco smoke.  She has never used smokeless tobacco. She reports that she does not drink alcohol and does not use drugs.  Family History:   Family History  Problem Relation Age of Onset   Hypertension Mother    Hyperlipidemia Mother    Stroke Mother    Urticaria Mother    Cancer Father        pancreatic   Colon cancer Father    Heart disease Brother 40       bypass   Heart disease Brother 29       bypass   Arthritis Other    Asthma Other    Diabetes Other    Colon cancer Paternal Aunt    Esophageal cancer Neg Hx    Stomach cancer Neg Hx    Rectal cancer Neg Hx     Blood pressure (!) 173/57, pulse 63, temperature 98 F (36.7 C), temperature source Oral, resp. rate 16, height 5' (1.524 m), weight 61 kg, SpO2 100%. Physical Exam: GEN sitting in the chair  HEENT EOMI  NECK + JVD PULM bibasilar crackles CV RRR + III/VI murmur ABD soft EXT 2+ LE edema  to the thighs NEURO alert SKIN warm and dry   Kaidan Spengler, LYNWOOD ORN, MD 04/11/2024, 9:23 AM

## 2024-04-11 NOTE — Progress Notes (Signed)
 Physical Therapy Treatment Patient Details Name: Emily Jennings MRN: [id number] DOB: 05-16-1934 Today's Date: 04/11/2024   History of Present Illness Emily Jennings is a 89 y.o. female with medical history significant for CHF, diabetes mellitus, hypertension, CKD 4.  Patient presented to the ED with complaints of difficulty breathing over the past 2 days, swelling to bilateral lower extremities up to her thighs, swelling of her arms.  She reports weight gain from 128-135 today.  She reports compliance with her medication-torsemide  60/40mg . Torsemide  dose was increased 7/15. Recent hospitalizations for same-most recent 6/23 to 6/25.    PT Comments  Patient tolerated PT session well.  Received patient in recliner with husband present initially but he leaves prior to starting mobility. Patient requesting to use commode. Due to urgency, patient prefers BSC. Min A for STS and transfer with RW due to unsteadiness and general weakness. Patient performs pericare in standing. Returns to recliner and performs LE exercises in sitting with back elevated from backrest for core engagement. Patient remains in recliner and PT assists with lunch tray set up. Nursing present at EOS to administer meds. Patient will benefit from continued skilled physical therapy acutely and in recommended venue in order to address current functional level.    If plan is discharge home, recommend the following: Help with stairs or ramp for entrance;Assist for transportation;Assistance with cooking/housework;A little help with walking and/or transfers;A little help with bathing/dressing/bathroom   Can travel by private vehicle     No  Equipment Recommendations  None recommended by PT    Recommendations for Other Services       Precautions / Restrictions Precautions Precautions: Fall Recall of Precautions/Restrictions: Intact Restrictions Weight Bearing Restrictions Per Provider Order: No     Mobility  Bed Mobility        General bed mobility comments: Pt received sitting in recliner at start of session    Transfers Overall transfer level: Needs assistance Equipment used: Rolling walker (2 wheels) Transfers: Sit to/from Stand, Bed to chair/wheelchair/BSC Sit to Stand: Min assist   Step pivot transfers: Min assist       General transfer comment: Cont slow labored movement and unsteadiness on feet, inc forward lean once standing with RW    Ambulation/Gait Ambulation/Gait assistance: Min assist, Contact guard assist Gait Distance (Feet): 5 Feet Assistive device: Rolling walker (2 wheels) Gait Pattern/deviations: Decreased step length - right, Decreased step length - left, Trunk flexed, Narrow base of support Gait velocity: Dec     General Gait Details: Cont slow labored movement. pt unsteady on feet, inc forward lean throughout mobility, short and narrow steps   Stairs             Wheelchair Mobility     Tilt Bed    Modified Rankin (Stroke Patients Only)       Balance Overall balance assessment: Needs assistance Sitting-balance support: Feet supported, No upper extremity supported Sitting balance-Leahy Scale: Good Sitting balance - Comments: fair/good EOB   Standing balance support: Reliant on assistive device for balance, During functional activity, Bilateral upper extremity supported Standing balance-Leahy Scale: Fair Standing balance comment: fair using RW          Communication Communication Communication: No apparent difficulties  Cognition Arousal: Alert Behavior During Therapy: WFL for tasks assessed/performed       Following commands: Intact      Cueing Cueing Techniques: Verbal cues, Tactile cues, Visual cues  Exercises General Exercises - Lower Extremity Ankle Circles/Pumps: AROM, Strengthening, Both, 10 reps,  Seated Long Arc Quad: AROM, Strengthening, Both, 10 reps, Seated Hip ABduction/ADduction: AROM, Strengthening, Both, 10 reps, Seated Hip  Flexion/Marching: AROM, Strengthening, 10 reps, Both, Seated    General Comments        Pertinent Vitals/Pain Pain Assessment Pain Assessment: No/denies pain    Home Living                          Prior Function            PT Goals (current goals can now be found in the care plan section) Acute Rehab PT Goals Patient Stated Goal: return home PT Goal Formulation: With patient Time For Goal Achievement: 04/23/24 Potential to Achieve Goals: Good Progress towards PT goals: Progressing toward goals    Frequency    Min 3X/week      PT Plan      Co-evaluation              AM-PAC PT 6 Clicks Mobility   Outcome Measure  Help needed turning from your back to your side while in a flat bed without using bedrails?: A Little Help needed moving from lying on your back to sitting on the side of a flat bed without using bedrails?: A Little Help needed moving to and from a bed to a chair (including a wheelchair)?: A Little Help needed standing up from a chair using your arms (e.g., wheelchair or bedside chair)?: A Little Help needed to walk in hospital room?: A Lot Help needed climbing 3-5 steps with a railing? : A Lot 6 Click Score: 16    End of Session Equipment Utilized During Treatment: Gait belt Activity Tolerance: Patient tolerated treatment well;Patient limited by fatigue Patient left: in chair;with nursing/sitter in room;with chair alarm set;with call bell/phone within reach Nurse Communication: Mobility status PT Visit Diagnosis: Unsteadiness on feet (R26.81);Other abnormalities of gait and mobility (R26.89);Muscle weakness (generalized) (M62.81)     Time: 8868-8848 PT Time Calculation (min) (ACUTE ONLY): 20 min  Charges:    $Therapeutic Activity: 8-22 mins PT General Charges $$ ACUTE PT VISIT: 1 Visit                     12:17 PM, 04/11/24 Rael Tilly Powell-Butler, PT, DPT [place] with [facility]

## 2024-04-12 DIAGNOSIS — I5033 Acute on chronic diastolic (congestive) heart failure: Secondary | ICD-10-CM | POA: Diagnosis not present

## 2024-04-12 LAB — CBC
HCT: 23.7 % — ABNORMAL LOW (ref 36.0–46.0)
Hemoglobin: 8.2 g/dL — ABNORMAL LOW (ref 12.0–15.0)
MCH: 32.2 pg (ref 26.0–34.0)
MCHC: 34.6 g/dL (ref 30.0–36.0)
MCV: 92.9 fL (ref 80.0–100.0)
Platelets: 140 K/uL — ABNORMAL LOW (ref 150–400)
RBC: 2.55 MIL/uL — ABNORMAL LOW (ref 3.87–5.11)
RDW: 13.3 % (ref 11.5–15.5)
WBC: 4.1 K/uL (ref 4.0–10.5)
nRBC: 0 % (ref 0.0–0.2)

## 2024-04-12 LAB — GLUCOSE, CAPILLARY
Glucose-Capillary: 73 mg/dL (ref 70–99)
Glucose-Capillary: 138 mg/dL — ABNORMAL HIGH (ref 70–99)
Glucose-Capillary: 184 mg/dL — ABNORMAL HIGH (ref 70–99)
Glucose-Capillary: 205 mg/dL — ABNORMAL HIGH (ref 70–99)

## 2024-04-12 LAB — BASIC METABOLIC PANEL WITH GFR
Anion gap: 12 (ref 5–15)
BUN: 60 mg/dL — ABNORMAL HIGH (ref 8–23)
CO2: 31 mmol/L (ref 22–32)
Calcium: 8.9 mg/dL (ref 8.9–10.3)
Chloride: 86 mmol/L — ABNORMAL LOW (ref 98–111)
Creatinine, Ser: 2.59 mg/dL — ABNORMAL HIGH (ref 0.44–1.00)
GFR, Estimated: 17 mL/min — ABNORMAL LOW (ref >60)
Glucose, Bld: 104 mg/dL — ABNORMAL HIGH (ref 70–99)
Potassium: 2.8 mmol/L — ABNORMAL LOW (ref 3.5–5.1)
Sodium: 129 mmol/L — ABNORMAL LOW (ref 135–145)

## 2024-04-12 LAB — MAGNESIUM: Magnesium: 2.2 mg/dL (ref 1.7–2.4)

## 2024-04-12 MED ORDER — HYDRALAZINE HCL 20 MG/ML IJ SOLN
10.0000 mg | Freq: Once | INTRAMUSCULAR | Status: AC
  Administered 2024-04-12: 10 mg via INTRAVENOUS
  Filled 2024-04-12: qty 1

## 2024-04-12 MED ORDER — POTASSIUM CHLORIDE CRYS ER 10 MEQ PO TBCR
10.0000 meq | EXTENDED_RELEASE_TABLET | Freq: Two times a day (BID) | ORAL | Status: AC
Start: 2024-04-12 — End: 2024-04-12
  Administered 2024-04-12 (×2): 10 meq via ORAL
  Filled 2024-04-12 (×2): qty 1

## 2024-04-12 MED ORDER — LABETALOL HCL 5 MG/ML IV SOLN
10.0000 mg | INTRAVENOUS | Status: DC | PRN
  Administered 2024-04-12 – 2024-04-20 (×12): 10 mg via INTRAVENOUS
  Filled 2024-04-12 (×13): qty 4

## 2024-04-12 MED ORDER — POTASSIUM CHLORIDE 10 MEQ/100ML IV SOLN
10.0000 meq | INTRAVENOUS | Status: AC
  Administered 2024-04-12 (×4): 10 meq via INTRAVENOUS
  Filled 2024-04-12 (×5): qty 100

## 2024-04-12 MED ORDER — SENNOSIDES 8.8 MG/5ML PO SYRP
10.0000 mL | ORAL_SOLUTION | Freq: Two times a day (BID) | ORAL | Status: DC
  Administered 2024-04-12 – 2024-04-14 (×5): 10 mL via ORAL
  Filled 2024-04-12 (×9): qty 10

## 2024-04-12 MED ORDER — LABETALOL HCL 5 MG/ML IV SOLN
20.0000 mg | Freq: Once | INTRAVENOUS | Status: AC
  Administered 2024-04-12: 20 mg via INTRAVENOUS
  Filled 2024-04-12: qty 4

## 2024-04-12 NOTE — Progress Notes (Signed)
   04/12/24 0426  Assess: MEWS Score  Temp 98 F (36.7 C)  BP (!) 220/74  MAP (mmHg) 116  Pulse Rate 62  Resp 16  SpO2 99 %  O2 Device Room Air  Assess: MEWS Score  MEWS Temp 0  MEWS Systolic 2  MEWS Pulse 0  MEWS RR 0  MEWS LOC 0  MEWS Score 2  MEWS Score Color Yellow  Assess: if the MEWS score is Yellow or Red  Were vital signs accurate and taken at a resting state? Yes  Does the patient meet 2 or more of the SIRS criteria? No  MEWS guidelines implemented  Yes, yellow  Treat  MEWS Interventions Considered administering scheduled or prn medications/treatments as ordered  Take Vital Signs  Increase Vital Sign Frequency  Yellow: Q2hr x1, continue Q4hrs until patient remains green for 12hrs  Escalate  MEWS: Escalate Yellow: Discuss with charge nurse and consider notifying provider and/or RRT  Notify: Charge Nurse/RN  Name of Charge Nurse/RN Notified Geneticist, molecular  Provider Notification  Provider Name/Title Dr.Dorrell  Date Provider Notified 04/12/24  Time Provider Notified [phone]  Method of Notification Page  Notification Reason Other (Comment)  Provider response See new orders  Date of Provider Response 04/12/24  Time of Provider Response 0430  Assess: SIRS CRITERIA  SIRS Temperature  0  SIRS Respirations  0  SIRS Pulse 0  SIRS WBC 0  SIRS Score Sum  0

## 2024-04-12 NOTE — Plan of Care (Signed)

## 2024-04-12 NOTE — Plan of Care (Signed)
  Problem: Education: Goal: Knowledge of General Education information will improve Description: Including pain rating scale, medication(s)/side effects and non-pharmacologic comfort measures Outcome: Progressing   Problem: Health Behavior/Discharge Planning: Goal: Ability to manage health-related needs will improve Outcome: Progressing   Problem: Clinical Measurements: Goal: Ability to maintain clinical measurements within normal limits will improve Outcome: Progressing Goal: Respiratory complications will improve Outcome: Progressing Goal: Cardiovascular complication will be avoided Outcome: Progressing   Problem: Elimination: Goal: Will not experience complications related to bowel motility Outcome: Progressing Goal: Will not experience complications related to urinary retention Outcome: Progressing   Problem: Pain Managment: Goal: General experience of comfort will improve and/or be controlled Outcome: Progressing

## 2024-04-12 NOTE — Progress Notes (Signed)
 Progress Note   Patient: Emily Jennings FMW:[id number] DOB: Apr 03, 1934 DOA: 04/07/2024     5 DOS: the patient was seen and examined on 04/12/2024   Subjective:  The patient was seen and examined this morning, awake alert oriented, denies any headaches visual changes symmetric weakness denies any chest pain or shortness of breath Blood pressure elevated to 220/74.  Complain of constipation difficulty having BM      -----------------------------------------------------------------------------------------------------------------------------Brief hospital admission narrative: Emily Jennings is a 89 y.o. female with medical history significant for CHF, diabetes mellitus, hypertension, CKD 4. Patient presented to the ED with complaints of difficulty breathing over the past 2 days, swelling to bilateral lower extremities up to her thighs, swelling of her arms.  She reports weight gain from 128-135 today.  She reports compliance with her medication-torsemide  60/40mg . Torsemide  dose was increased 7/15.   Recent hospitalizations for same-most recent 6/23 to 6/25.   ED Course: Temperature 92.8.  Heart rate 56-64.  Respirate rate 11-25.  Blood pressure systolic 143-174.  O2 sat greater than 95% on room air.  BNP 962.  Troponin 6 > 7. Chest x-ray small bilateral pleural effusions. Lasix  60 mg x 1 given ========================================================================    Assessment and plan  Acute on chronic diastolic heart failure -Improving -Nephrology following closely assisting with diuretics -- Continue Lasix  IV>> increased to 120 every 8 hours per nephrology   Intake/Output Summary (Last 24 hours) at 04/12/2024 1026 Last data filed at 04/12/2024 0857 Gross per 24 hour  Intake 988.06 ml  Output 4550 ml  Net -3561.94 ml   -Follow daily weights, strict I's and O's, Reds clip measurements and low-sodium diet. - Recent 2D echo demonstrating ejection fraction 70 to 75% with grade 2  diastolic dysfunction (no need to be repeated). - Still significant fluid overload appreciated on exam and patient has presence of orthopnea.  Hypertensive crisis -Blood pressure 220/74 - Will continue adjusted antihypertensive agents Blood pressure still elevated 173/57 this morning - Increasing nicardipine  90 mg daily - Continue:, clonidine  0.3 mg 3 times daily, hydralazine  100 mg 3 times daily Discontinue Coreg , adding labetalol  200 mg twice daily  --On IV Lasix  increased to 120 mg IV Q8 hrs  - Administrating extra dose of 20 mg of IV labetalol  as blood pressure   Type 2 diabetes mellitus with nephropathy - A1c 7.4 - Continue to follow CBG fluctuation and as hypoglycemic regimen as required. - Continue sliding scale insulin  and the use of Semglee .  Chronic kidney disease stage IV - Stabilizing,-monitoring -Continue to follow renal function trend stability especially with ongoing IV diuresis. - Follow electrolytes and replete them as needed. Lab Results  Component Value Date   CREATININE 2.59 (H) 04/12/2024   CREATININE 2.53 (H) 04/11/2024   CREATININE 2.63 (H) 04/10/2024  Nephrology consulted, appreciate close follow-up and observation -Not a candidate for hemodialysis   Hyponatremia - Appears to be associated with fluid overload -Improving - Continue treatment with diuretics - Follow electrolytes trend - Patient asymptomatic. - Serum sodium level: 123, 123, 125, 126 t on 29 oday  - Fluid restriction 1200 mL per day  Hypokalemia Persistent -repleting orally and with IV -Likely aggressive diuretics (IV Lasix ) - Monitoring and replacing daily - Replacing with magnesium  2 g   Anemia of chronic disease due to chronic kidney disease -Monitoring H&H closely - Follow-up with iron  studies - Nephrology added Aranesp  60 mg q. weekly   Ethics: Chart reviewed, ACP reviewed, patient has goldenrod, patient is DNR Given the above  comorbidities, continued decline-poor  prognosis noted Consulting palliative care for further discussion regarding goals of care     =========================================================================Physical Exam:Blood pressure (!) 193/67, pulse 66, temperature 98.4 F (36.9 C), temperature source Oral, resp. rate 14, height 5' (1.524 m), weight 57.9 kg, SpO2 100%.      General:  AAO x 3,  cooperative, no distress;   HEENT:  Normocephalic, PERRL, otherwise with in Normal limits   Neuro:  CNII-XII intact. , normal motor and sensation, reflexes intact   Lungs:   Clear to auscultation BL, Respirations unlabored,  No wheezes / crackles  Cardio:    S1/S2, RRR, No murmure, No Rubs or Gallops   Abdomen:  Soft, non-tender, bowel sounds active all four quadrants, no guarding or peritoneal signs.  Muscular  skeletal:  Limited exam -global generalized weaknesses - in bed, able to move all 4 extremities,   2+ pulses,  symmetric, +2 pitting edema  Skin:  Dry, warm to touch, negative for any Rashes,  Wounds: Please see nursing documentation  Pressure Injury 02/06/24 Coccyx Medial;Upper Stage 2 -  Partial thickness loss of dermis presenting as a shallow open injury with a red, pink wound bed without slough. 2x2 (Active)  02/06/24 1200  Location: Coccyx  Location Orientation: Medial;Upper  Staging: Stage 2 -  Partial thickness loss of dermis presenting as a shallow open injury with a red, pink wound bed without slough.  Wound Description (Comments): 2x2  DO NOT USE:  Present on Admission:              Data Reviewed:    Latest Ref Rng & Units 04/12/2024    4:16 AM 04/09/2024    4:15 AM 04/08/2024    4:30 AM  CBC  WBC 4.0 - 10.5 K/uL 4.1  2.9  2.9   Hemoglobin 12.0 - 15.0 g/dL 8.2  7.9  7.8   Hematocrit 36.0 - 46.0 % 23.7  22.8  22.6   Platelets 150 - 400 K/uL 140  149  129       Latest Ref Rng & Units 04/12/2024    4:16 AM 04/11/2024    4:22 AM 04/10/2024    8:20 AM  BMP  Glucose 70 - 99 mg/dL 895  811  89    BUN 8 - 23 mg/dL 60  61  59   Creatinine 0.44 - 1.00 mg/dL 7.40  7.46  7.36   Sodium 135 - 145 mmol/L 129  126  126   Potassium 3.5 - 5.1 mmol/L 2.8  3.2  3.5   Chloride 98 - 111 mmol/L 86  88  88   CO2 22 - 32 mmol/L 31  30  28    Calcium  8.9 - 10.3 mg/dL 8.9  8.5  8.4     Family Communication: No family at bedside.  Disposition: Status is: Inpatient Remains inpatient appropriate because: Continue IV diuresis.  Time spent: 55 minutes  Author: Adriana DELENA Grams, MD 04/12/2024 10:26 AM  For on call review [email or web address].

## 2024-04-13 DIAGNOSIS — I5033 Acute on chronic diastolic (congestive) heart failure: Secondary | ICD-10-CM | POA: Diagnosis not present

## 2024-04-13 LAB — CBC
HCT: 23.5 % — ABNORMAL LOW (ref 36.0–46.0)
Hemoglobin: 7.8 g/dL — ABNORMAL LOW (ref 12.0–15.0)
MCH: 31 pg (ref 26.0–34.0)
MCHC: 33.2 g/dL (ref 30.0–36.0)
MCV: 93.3 fL (ref 80.0–100.0)
Platelets: 140 K/uL — ABNORMAL LOW (ref 150–400)
RBC: 2.52 MIL/uL — ABNORMAL LOW (ref 3.87–5.11)
RDW: 13.3 % (ref 11.5–15.5)
WBC: 4.3 K/uL (ref 4.0–10.5)
nRBC: 0 % (ref 0.0–0.2)

## 2024-04-13 LAB — GLUCOSE, CAPILLARY
Glucose-Capillary: 49 mg/dL — ABNORMAL LOW (ref 70–99)
Glucose-Capillary: 123 mg/dL — ABNORMAL HIGH (ref 70–99)
Glucose-Capillary: 62 mg/dL — ABNORMAL LOW (ref 70–99)
Glucose-Capillary: 77 mg/dL (ref 70–99)
Glucose-Capillary: 160 mg/dL — ABNORMAL HIGH (ref 70–99)
Glucose-Capillary: 110 mg/dL — ABNORMAL HIGH (ref 70–99)
Glucose-Capillary: 238 mg/dL — ABNORMAL HIGH (ref 70–99)

## 2024-04-13 LAB — BASIC METABOLIC PANEL WITH GFR
Anion gap: 13 (ref 5–15)
BUN: 60 mg/dL — ABNORMAL HIGH (ref 8–23)
CO2: 34 mmol/L — ABNORMAL HIGH (ref 22–32)
Calcium: 8.9 mg/dL (ref 8.9–10.3)
Chloride: 86 mmol/L — ABNORMAL LOW (ref 98–111)
Creatinine, Ser: 2.6 mg/dL — ABNORMAL HIGH (ref 0.44–1.00)
GFR, Estimated: 17 mL/min — ABNORMAL LOW (ref >60)
Glucose, Bld: 51 mg/dL — ABNORMAL LOW (ref 70–99)
Potassium: 2.5 mmol/L — CL (ref 3.5–5.1)
Sodium: 133 mmol/L — ABNORMAL LOW (ref 135–145)

## 2024-04-13 MED ORDER — SODIUM CHLORIDE 0.9 % IV BOLUS
500.0000 mL | Freq: Once | INTRAVENOUS | Status: AC
  Administered 2024-04-13: 500 mL via INTRAVENOUS

## 2024-04-13 MED ORDER — FUROSEMIDE 10 MG/ML IJ SOLN: 40.0000 mg | Freq: Three times a day (TID) | INTRAMUSCULAR | Status: DC

## 2024-04-13 MED ORDER — GLUCOSE 40 % PO GEL: 2.0000 | ORAL | Status: AC

## 2024-04-13 MED ORDER — ATROPINE SULFATE 1 MG/ML IV SOLN
0.4000 mg | Freq: Once | INTRAVENOUS | Status: AC
  Filled 2024-04-13: qty 1

## 2024-04-13 MED ORDER — ATROPINE SULFATE 1 MG/ML IV SOLN
INTRAVENOUS | Status: AC
  Administered 2024-04-13: 0.4 mg via INTRAVENOUS
  Filled 2024-04-13: qty 1

## 2024-04-13 MED ORDER — DILTIAZEM HCL 60 MG PO TABS: 60.0000 mg | ORAL_TABLET | Freq: Four times a day (QID) | ORAL | Status: DC

## 2024-04-13 MED ORDER — POTASSIUM CHLORIDE 20 MEQ PO PACK
40.0000 meq | PACK | ORAL | Status: AC
  Administered 2024-04-13 (×3): 40 meq via ORAL
  Filled 2024-04-13 (×3): qty 2

## 2024-04-13 MED ORDER — MIDODRINE HCL 5 MG PO TABS
10.0000 mg | ORAL_TABLET | Freq: Once | ORAL | Status: AC
  Administered 2024-04-13: 10 mg via ORAL
  Filled 2024-04-13: qty 2

## 2024-04-13 MED ORDER — MAGNESIUM SULFATE 2 GM/50ML IV SOLN
2.0000 g | Freq: Once | INTRAVENOUS | Status: AC
  Administered 2024-04-13: 2 g via INTRAVENOUS
  Filled 2024-04-13: qty 50

## 2024-04-13 MED ORDER — DILTIAZEM HCL 60 MG PO TABS
90.0000 mg | ORAL_TABLET | Freq: Four times a day (QID) | ORAL | Status: DC
  Administered 2024-04-13: 90 mg via ORAL
  Filled 2024-04-13 (×2): qty 1

## 2024-04-13 MED ORDER — GLUCOSE 40 % PO GEL
ORAL | Status: AC
  Administered 2024-04-13: 37.5 g via ORAL
  Filled 2024-04-13: qty 1.21

## 2024-04-13 MED ORDER — CHLORHEXIDINE GLUCONATE CLOTH 2 % EX PADS
6.0000 | MEDICATED_PAD | Freq: Every day | CUTANEOUS | Status: DC
  Administered 2024-04-13 – 2024-04-21 (×7): 6 via TOPICAL

## 2024-04-13 MED ORDER — ATROPINE SULFATE 0.4 MG/ML IV SOLN
0.4000 mg | Freq: Once | INTRAVENOUS | Status: DC
  Filled 2024-04-13: qty 1

## 2024-04-13 MED ORDER — TORSEMIDE 20 MG PO TABS
40.0000 mg | ORAL_TABLET | Freq: Two times a day (BID) | ORAL | Status: DC
  Filled 2024-04-13: qty 2

## 2024-04-13 NOTE — Progress Notes (Signed)
 Hypoglycemic Event  CBG: 49  Treatment: 1 tube glucose gel, apple sauce, graham crackers  Symptoms: None  Follow-up CBG: Upfz:[id number] CBG Result:77  Possible Reasons for Event: Inadequate meal intake  Comments: Patient refused second tube of glucose gel so patient ate applesauce and a pack of graham crackers.     Emily Jennings

## 2024-04-13 NOTE — Plan of Care (Signed)
  Problem: Clinical Measurements: Goal: Ability to maintain clinical measurements within normal limits will improve Outcome: Progressing   Problem: Clinical Measurements: Goal: Respiratory complications will improve Outcome: Progressing   Problem: Coping: Goal: Level of anxiety will decrease Outcome: Progressing   Problem: Nutrition: Goal: Adequate nutrition will be maintained Outcome: Progressing

## 2024-04-13 NOTE — Plan of Care (Signed)
   Problem: Coping: Goal: Level of anxiety will decrease Outcome: Progressing   Problem: Pain Managment: Goal: General experience of comfort will improve and/or be controlled Outcome: Progressing

## 2024-04-13 NOTE — Progress Notes (Signed)
 Patient transferred to ICU 04, per Dr Rendall. Report given to Tiffany RN at bedside.

## 2024-04-13 NOTE — Progress Notes (Signed)
 Amnion sent at 1719 requesting Dr to the bedside per SOB and HR in 30-40's.

## 2024-04-13 NOTE — Progress Notes (Signed)
 Progress Note   Patient: Emily Jennings FMW:[id number] DOB: 05/02/1934 DOA: 04/07/2024     6 DOS: the patient was seen and examined on 04/13/2024   Subjective:   The patient was seen and examined this morning, stable sitting up in chair. Per nursing staff satting 98% on room air but more comfortable on 2 L of oxygen Denies any chest pain Reporting some swelling in upper thigh area    -----------------------------------------------------------------------------------------------------------------------------Brief hospital admission narrative: Emily Jennings is a 89 y.o. female with medical history significant for CHF, diabetes mellitus, hypertension, CKD 4. Patient presented to the ED with complaints of difficulty breathing over the past 2 days, swelling to bilateral lower extremities up to her thighs, swelling of her arms.  She reports weight gain from 128-135 today.  She reports compliance with her medication-torsemide  60/40mg . Torsemide  dose was increased 7/15.   Recent hospitalizations for same-most recent 6/23 to 6/25.   ED Course: Temperature 92.8.  Heart rate 56-64.  Respirate rate 11-25.  Blood pressure systolic 143-174.  O2 sat greater than 95% on room air.  BNP 962.  Troponin 6 > 7. Chest x-ray small bilateral pleural effusions. Lasix  60 mg x 1 given ========================================================================    Assessment and plan  Acute on chronic diastolic heart failure - Continue to improve -Nephrology following closely assisting with diuretics -- Continue Lasix  IV>> increased to 120 every 8 hours -switch to p.o. Demadex  40 twice daily 7/27 Due to persistent hypokalemia, hypochloride anemia, acute on CKD   Intake/Output Summary (Last 24 hours) at 04/13/2024 1122 Last data filed at 04/13/2024 0925 Gross per 24 hour  Intake 566 ml  Output 2250 ml  Net -1684 ml   -Follow daily weights, strict I's and O's, Reds clip measurements and low-sodium diet. -  Recent 2D echo demonstrating ejection fraction 70 to 75% with grade 2 diastolic dysfunction (no need to be repeated).  - Improved anasarca, improved orthopnea  Hypertensive crisis -Persistent/resistant hypertension - Will continue adjusted antihypertensive agents -Blood pressure 174/62 this a.m. (212/67)  - Nicardipine  90 mg D/Ced  7/27 substituted with Cardizem  -started on 90 mg every 6 hours  - Continue:  Clonidine  0.3 mg 3 times daily,  Hydralazine  100 mg 3 times daily Labetalol  200 mg twice daily (discontinued Coreg  -Demadex  40 mg p.o. twice daily - D/Ced IV Lasix    Type 2 diabetes mellitus with nephropathy - A1c 7.4 - Continue to follow CBG fluctuation and as hypoglycemic regimen as required. - Continue sliding scale insulin  and the use of Semglee .  Chronic kidney disease stage IV - Monitoring closely -Nephrology following-no mass dialysis -Continue to follow renal function trend stability especially with ongoing IV diuresis. - Follow electrolytes and replete them as needed. Lab Results  Component Value Date   CREATININE 2.60 (H) 04/13/2024   CREATININE 2.59 (H) 04/12/2024   CREATININE 2.53 (H) 04/11/2024  Nephrology -appreciate follow-up and recommendations   Hyponatremia -Much improved - Continue treatment with diuretics - Follow electrolytes trend - Patient asymptomatic. - Serum sodium level: 123, 123, 125, 126 t on 29 >>> 133 today - Fluid restriction 1200 mL per day  Hypokalemia K 3.2, 2.8, 2.5, -Mag 2.2 Persistent -repleting orally and with IV -Likely aggressive diuretics (IV Lasix ) - Monitoring and replacing daily - Replacing with magnesium  2 g   Anemia of chronic disease due to chronic kidney disease -Monitoring H&H closely - Follow-up with iron  studies - Nephrology added Aranesp  60 mg q. weekly   Ethics: Chart reviewed, ACP reviewed, patient has goldenrod,  patient is DNR Given the above comorbidities, continued decline-poor prognosis  noted Consulting palliative care for further discussion regarding goals of care     ========================================================================= Physical Exam: Blood pressure (!) 174/62, pulse 68, temperature (!) 97.4 F (36.3 C), temperature source Oral, resp. rate 18, height 5' (1.524 m), weight 56.6 kg, SpO2 98%.   General:  AAO x 3,  cooperative, no distress;   HEENT:  Normocephalic, PERRL, otherwise with in Normal limits   Neuro:  CNII-XII intact. , normal motor and sensation, reflexes intact   Lungs:   Clear to auscultation BL, Respirations unlabored,  No wheezes / crackles  Cardio:    S1/S2, RRR, No murmure, No Rubs or Gallops   Abdomen:  Soft, non-tender, bowel sounds active all four quadrants, no guarding or peritoneal signs.  Muscular  skeletal:  Limited exam -global generalized weaknesses - in bed, able to move all 4 extremities,   2+ pulses,  symmetric, No pitting edema  Skin:  Dry, warm to touch, negative for any Rashes,  Wounds: Please see nursing documentation  Pressure Injury 02/06/24 Coccyx Medial;Upper Stage 2 -  Partial thickness loss of dermis presenting as a shallow open injury with a red, pink wound bed without slough. 2x2 (Active)  02/06/24 1200  Location: Coccyx  Location Orientation: Medial;Upper  Staging: Stage 2 -  Partial thickness loss of dermis presenting as a shallow open injury with a red, pink wound bed without slough.  Wound Description (Comments): 2x2  DO NOT USE:  Present on Admission:              Data Reviewed:    Latest Ref Rng & Units 04/13/2024    4:29 AM 04/12/2024    4:16 AM 04/09/2024    4:15 AM  CBC  WBC 4.0 - 10.5 K/uL 4.3  4.1  2.9   Hemoglobin 12.0 - 15.0 g/dL 7.8  8.2  7.9   Hematocrit 36.0 - 46.0 % 23.5  23.7  22.8   Platelets 150 - 400 K/uL 140  140  149       Latest Ref Rng & Units 04/13/2024    4:29 AM 04/12/2024    4:16 AM 04/11/2024    4:22 AM  BMP  Glucose 70 - 99 mg/dL 51  895  811   BUN 8 -  23 mg/dL 60  60  61   Creatinine 0.44 - 1.00 mg/dL 7.39  7.40  7.46   Sodium 135 - 145 mmol/L 133  129  126   Potassium 3.5 - 5.1 mmol/L 2.5  2.8  3.2   Chloride 98 - 111 mmol/L 86  86  88   CO2 22 - 32 mmol/L 34  31  30   Calcium  8.9 - 10.3 mg/dL 8.9  8.9  8.5     Family Communication: 04/13/2024 husband present at bedside, discussed CODE STATUS goals of care with patient and her husband in detail   Disposition: -Likely home with home health palliative care in next 24-48 hours Status is: Inpatient Remains inpatient appropriate because: Continue IV diuresis.  Time spent: 55 minutes  Author: Adriana DELENA Grams, MD 04/13/2024 11:22 AM  For on call review [email or web address].

## 2024-04-13 NOTE — Progress Notes (Addendum)
  Pt with symptomatic bradycardia and hypotension with heart rate around 40 and blood pressure of 77/38 -Patient complains of dyspnea, no chest pain - -Twelve-lead EKG reviewed Transferred to stepdown unit - Give atropine  IV x 1 -Give midodrine  10 mg p.o. x 1 -Give normal saline 500 mL bolus x 1 STOP Hydralazine /isosorbide , labatalol , Cardizem , clonidine  and torsemide  at this time - Total critical care time 34 minutes Discussed with primary attending and RN at bedside  Rendall Carwin, MD

## 2024-04-14 DIAGNOSIS — N183 Chronic kidney disease, stage 3 unspecified: Secondary | ICD-10-CM

## 2024-04-14 DIAGNOSIS — I350 Nonrheumatic aortic (valve) stenosis: Secondary | ICD-10-CM

## 2024-04-14 DIAGNOSIS — R001 Bradycardia, unspecified: Secondary | ICD-10-CM

## 2024-04-14 DIAGNOSIS — I5033 Acute on chronic diastolic (congestive) heart failure: Secondary | ICD-10-CM | POA: Diagnosis not present

## 2024-04-14 DIAGNOSIS — I509 Heart failure, unspecified: Secondary | ICD-10-CM

## 2024-04-14 DIAGNOSIS — I10 Essential (primary) hypertension: Secondary | ICD-10-CM | POA: Diagnosis not present

## 2024-04-14 LAB — URINALYSIS, ROUTINE W REFLEX MICROSCOPIC
Bilirubin Urine: NEGATIVE
Glucose, UA: 150 mg/dL — AB
Hgb urine dipstick: NEGATIVE
Ketones, ur: NEGATIVE mg/dL
Leukocytes,Ua: NEGATIVE
Nitrite: NEGATIVE
Protein, ur: 100 mg/dL — AB
Specific Gravity, Urine: 1.009 (ref 1.005–1.030)
pH: 6 (ref 5.0–8.0)

## 2024-04-14 LAB — GLUCOSE, CAPILLARY
Glucose-Capillary: 126 mg/dL — ABNORMAL HIGH (ref 70–99)
Glucose-Capillary: 275 mg/dL — ABNORMAL HIGH (ref 70–99)
Glucose-Capillary: 227 mg/dL — ABNORMAL HIGH (ref 70–99)
Glucose-Capillary: 316 mg/dL — ABNORMAL HIGH (ref 70–99)
Glucose-Capillary: 249 mg/dL — ABNORMAL HIGH (ref 70–99)

## 2024-04-14 LAB — CBC
HCT: 23 % — ABNORMAL LOW (ref 36.0–46.0)
Hemoglobin: 7.5 g/dL — ABNORMAL LOW (ref 12.0–15.0)
MCH: 31.1 pg (ref 26.0–34.0)
MCHC: 32.6 g/dL (ref 30.0–36.0)
MCV: 95.4 fL (ref 80.0–100.0)
Platelets: 139 K/uL — ABNORMAL LOW (ref 150–400)
RBC: 2.41 MIL/uL — ABNORMAL LOW (ref 3.87–5.11)
RDW: 13.7 % (ref 11.5–15.5)
WBC: 4 K/uL (ref 4.0–10.5)
nRBC: 0 % (ref 0.0–0.2)

## 2024-04-14 LAB — BASIC METABOLIC PANEL WITH GFR
Anion gap: 10 (ref 5–15)
BUN: 64 mg/dL — ABNORMAL HIGH (ref 8–23)
CO2: 33 mmol/L — ABNORMAL HIGH (ref 22–32)
Calcium: 8.8 mg/dL — ABNORMAL LOW (ref 8.9–10.3)
Chloride: 86 mmol/L — ABNORMAL LOW (ref 98–111)
Creatinine, Ser: 2.83 mg/dL — ABNORMAL HIGH (ref 0.44–1.00)
GFR, Estimated: 16 mL/min — ABNORMAL LOW (ref >60)
Glucose, Bld: 141 mg/dL — ABNORMAL HIGH (ref 70–99)
Potassium: 3.9 mmol/L (ref 3.5–5.1)
Sodium: 129 mmol/L — ABNORMAL LOW (ref 135–145)

## 2024-04-14 LAB — PREPARE RBC (CROSSMATCH)

## 2024-04-14 LAB — BRAIN NATRIURETIC PEPTIDE: B Natriuretic Peptide: 31 pg/mL (ref 0.0–100.0)

## 2024-04-14 LAB — ABO/RH: ABO/RH(D): O POS

## 2024-04-14 LAB — PREALBUMIN: Prealbumin: 30 mg/dL (ref 18–38)

## 2024-04-14 MED ORDER — CARVEDILOL 3.125 MG PO TABS
3.1250 mg | ORAL_TABLET | Freq: Two times a day (BID) | ORAL | Status: DC
  Administered 2024-04-14 (×2): 3.125 mg via ORAL
  Filled 2024-04-14 (×2): qty 1

## 2024-04-14 MED ORDER — HYDRALAZINE HCL 50 MG PO TABS
75.0000 mg | ORAL_TABLET | Freq: Three times a day (TID) | ORAL | Status: DC
  Administered 2024-04-14 – 2024-04-15 (×2): 75 mg via ORAL
  Filled 2024-04-14 (×2): qty 1

## 2024-04-14 MED ORDER — HYDRALAZINE HCL 50 MG PO TABS
50.0000 mg | ORAL_TABLET | Freq: Three times a day (TID) | ORAL | Status: DC
  Administered 2024-04-14: 50 mg via ORAL
  Filled 2024-04-14: qty 1

## 2024-04-14 MED ORDER — SODIUM CHLORIDE 0.9% IV SOLUTION: Freq: Once | INTRAVENOUS | Status: AC

## 2024-04-14 MED ORDER — FUROSEMIDE 10 MG/ML IJ SOLN
40.0000 mg | Freq: Two times a day (BID) | INTRAMUSCULAR | Status: DC
  Administered 2024-04-14: 40 mg via INTRAVENOUS
  Filled 2024-04-14: qty 4

## 2024-04-14 MED ORDER — CARVEDILOL 3.125 MG PO TABS
6.2500 mg | ORAL_TABLET | Freq: Two times a day (BID) | ORAL | Status: DC
  Administered 2024-04-14: 6.25 mg via ORAL
  Filled 2024-04-14: qty 2

## 2024-04-14 NOTE — Progress Notes (Signed)
 Progress Note   Patient: AMIAYAH GIEBEL FMW:[id number] DOB: [birth year] DOA: 04/07/2024     7 DOS: the patient was seen and examined on 04/14/2024   Subjective:   The patient was seen and examined this morning, stable, blood pressure and heart rate has much improved. Awake alert oriented, denies any chest pain, mild shortness of breath at rest  Overnight around 6:30 PM yesterday 04/13/2024 patient became bradycardic, hypotensive Was subsequently received 1 dose of atropine , midodrine -and transferred to ICU for close observation   -----------------------------------------------------------------------------------------------------------------------------Brief hospital admission narrative: ANNLOUISE GERETY is a 89 y.o. female with medical history significant for CHF, diabetes mellitus, hypertension, CKD 4. Patient presented to the ED with complaints of difficulty breathing over the past 2 days, swelling to bilateral lower extremities up to her thighs, swelling of her arms.  She reports weight gain from 128-135 today.  She reports compliance with her medication-torsemide  60/40mg . Torsemide  dose was increased 7/15.   Recent hospitalizations for same-most recent 6/23 to 6/25.   ED Course: Temperature 92.8.  Heart rate 56-64.  Respirate rate 11-25.  Blood pressure systolic 143-174.  O2 sat greater than 95% on room air.  BNP 962.  Troponin 6 > 7. Chest x-ray small bilateral pleural effusions. Lasix  60 mg x 1 given ========================================================================    Assessment and plan  Decompensated overnight with hypotension, bradycardia -on 04/13/2024 around 1830 - Patient's heart rate was around 40, blood pressure 77/38 - Patient was given atropine  x 1, midodrine  10 mg p.o. x 1, 500 mL of bolus saline - Was transferred to ICU overnight for close observation  -Patient received diltiazem  90 mg x 1 on 04/13/2024 - Current medication of hydralazine /isosorbide , labetalol  and  clonidine  and torsemide  was discontinued  04/14/2024 -in ICU setting Blood pressure (!) 188/58, pulse 77, temperature (!) 97.5 F (36.4 C), temperature source Oral, resp. rate 16, height 5' (1.524 m), weight 61 kg, SpO2 90%.  Cardiology and nephrology following the patient closely: Restarted patient on:  Coreg  3.125 mg p.o. twice daily,  hydralazine  50 mg 3 times daily  Lasix  40 mg IV twice daily      Acute on chronic diastolic heart failure - HFpEF-  progressive cardiorenal syndrome - Progressive heart failure-  - Developing progressive cardiorenal syndrome -Cardiology nephrology consulted and following closely  -During this admission patient was treated aggressively with diuretics  Lasix  120 mg every 8 hours Till 04/12/24  switched to Demadex  40 mg twice daily on 7/27 Back to IV Lasix  40 mg twice daily started 04/14/2024  Filed Weights   04/13/24 0500 04/13/24 1839 04/14/24 0430  Weight: 56.6 kg 61 kg 61 kg     Intake/Output Summary (Last 24 hours) at 04/14/2024 1055 Last data filed at 04/14/2024 0600 Gross per 24 hour  Intake 1489.2 ml  Output 450 ml  Net 1039.2 ml   -Follow daily weights, strict I's and O's, Reds clip measurements and low-sodium diet. - Recent 2D echo demonstrating ejection fraction 70 to 75% with grade 2 diastolic dysfunction (no need to be repeated).  - Improved anasarca, improved orthopnea  Hypertensive crisis -Persistent/resistant hypertension Was on clonidine /hydralazine /labetalol /Cardizem -was discontinued 04/13/2024 - Restarting Coreg , hydralazine , IV Lasix    Type 2 diabetes mellitus with nephropathy - A1c 7.4 - CBG q. ACHS, SSI coverage - Titrating long-acting insulin  - Semglee .  Chronic kidney disease stage IV - Monitoring closely -Nephrology following-no mass dialysis -Continue to follow renal function trend stability especially with ongoing IV diuresis. - Follow electrolytes and replete them as needed. Lab Results  Component Value  Date   CREATININE 2.83 (H) 04/14/2024   CREATININE 2.60 (H) 04/13/2024   CREATININE 2.59 (H) 04/12/2024  Nephrology -appreciate follow-up and recommendations   Hyponatremia -Much improved - Continue treatment with diuretics - Follow electrolytes trend - Patient asymptomatic. - Serum sodium level: 123, 123, 125, 126 t --->  129 - Fluid restriction 1200 mL per day  Hypokalemia/hypomagnesemia K 3.2, 2.8, 2.5,>>> 3.9  -Mag 2.2 -Monitoring and repleting   Anemia of chronic disease due to chronic kidney disease -Monitoring H&H closely - Follow-up with iron  studies - Nephrology added Aranesp  60 mg q. Weekly    Latest Ref Rng & Units 04/14/2024    4:14 AM 04/13/2024    4:29 AM 04/12/2024    4:16 AM  CBC  WBC 4.0 - 10.5 K/uL 4.0  4.3  4.1   Hemoglobin 12.0 - 15.0 g/dL 7.5  7.8  8.2   Hematocrit 36.0 - 46.0 % 23.0  23.5  23.7   Platelets 150 - 400 K/uL 139  140  140    - Due to cardiorenal syndrome, hypotension - 04/14/2024 transfusing 1U PRBC   Ethics: Chart reviewed, ACP reviewed, patient has goldenrod, patient is DNR Given the above comorbidities, continued decline-poor prognosis noted Consulting palliative care for further discussion regarding goals of care     Consultant : Nephrology/cardiology/palliative care team   ========================================================================= Physical Exam: Blood pressure (!) 188/58, pulse 77, temperature (!) 97.5 F (36.4 C), temperature source Oral, resp. rate 16, height 5' (1.524 m), weight 61 kg, SpO2 90%.   General:  AAO x 3,  cooperative, no distress;   HEENT:  Normocephalic, PERRL, otherwise with in Normal limits   Neuro:  CNII-XII intact. , normal motor and sensation, reflexes intact   Lungs:   Clear to auscultation BL, Respirations unlabored,  No wheezes / crackles  Cardio:    S1/S2, RRR, No murmure, No Rubs or Gallops   Abdomen:  Soft, non-tender, bowel sounds active all four quadrants, no guarding or  peritoneal signs.  Muscular  skeletal:  Limited exam -global generalized weaknesses - in bed, able to move all 4 extremities,   2+ pulses,  symmetric, +2  pitting edema  Skin:  Dry, warm to touch, negative for any Rashes,  Wounds: Please see nursing documentation  Pressure Injury 02/06/24 Coccyx Medial;Upper Stage 2 -  Partial thickness loss of dermis presenting as a shallow open injury with a red, pink wound bed without slough. 2x2 (Active)  02/06/24 1200  Location: Coccyx  Location Orientation: Medial;Upper  Staging: Stage 2 -  Partial thickness loss of dermis presenting as a shallow open injury with a red, pink wound bed without slough.  Wound Description (Comments): 2x2  DO NOT USE:  Present on Admission:                 Data Reviewed:    Latest Ref Rng & Units 04/14/2024    4:14 AM 04/13/2024    4:29 AM 04/12/2024    4:16 AM  CBC  WBC 4.0 - 10.5 K/uL 4.0  4.3  4.1   Hemoglobin 12.0 - 15.0 g/dL 7.5  7.8  8.2   Hematocrit 36.0 - 46.0 % 23.0  23.5  23.7   Platelets 150 - 400 K/uL 139  140  140       Latest Ref Rng & Units 04/14/2024    4:14 AM 04/13/2024    4:29 AM 04/12/2024    4:16 AM  BMP  Glucose 70 -  99 mg/dL 858  51  895   BUN 8 - 23 mg/dL 64  60  60   Creatinine 0.44 - 1.00 mg/dL 7.16  7.39  7.40   Sodium 135 - 145 mmol/L 129  133  129   Potassium 3.5 - 5.1 mmol/L 3.9  2.5  2.8   Chloride 98 - 111 mmol/L 86  86  86   CO2 22 - 32 mmol/L 33  34  31   Calcium  8.9 - 10.3 mg/dL 8.8  8.9  8.9     Family Communication: 04/13/2024 husband present at bedside, discussed CODE STATUS goals of care with patient and her husband in detail   Disposition: -Likely home with home health palliative care in next 24-48 hours Status is: Inpatient Remains inpatient appropriate because: Continue IV diuresis.  Critical care time spent: 55 minutes Was spent in seeing evaluate the patient in ICU settings, reviewing medications, labs, drawn plan of care, discussion with consultants  and family  Author: Adriana DELENA Grams, MD 04/14/2024 10:55 AM  For on call review [email or web address].

## 2024-04-14 NOTE — Progress Notes (Signed)
 [facility] Progress Note    Assessment/ Plan:   AKI on CKD4-5 -AKI likely secondary to cardiorenal. Cr currently not far off as compared to baseline. C/w diuresis -poor dialysis candidate (age, functional status). Followed by Dr. Rachele as an outpatient. Most recent palliative care's note reviewed from 7/23-patient would prefer comfort measures over dialysis. -Avoid nephrotoxic medications including NSAIDs and iodinated intravenous contrast exposure unless the latter is absolutely indicated.  Preferred narcotic agents for pain control are hydromorphone, fentanyl , and methadone. Morphine  should not be used. Avoid Baclofen and avoid oral sodium phosphate  and magnesium  citrate based laxatives / bowel preps. Continue strict Input and Output monitoring. Will monitor the patient closely with you and intervene or adjust therapy as indicated by changes in clinical status/labs   Acute on chronic HFpEF -c/w diuresis: lasix  40mg  IV BID  Hypertensive Crisis Hypotension-improved Bradycardia-improved -BP meds now on hold. Possible iatrogenic secondary to starting cardizem ? Now back to being hypertensive. Given yesterday's events, recommend reintroducing meds--would start with hydralazine  -c/w lasix   Anemia of CKD -transfuse PRN for Hgb <7- Agree with 1U PRBC today (hgb 7.5) -c/w aranesp  60 mcg qweekly  Hypervolemic Hyponatremia -c/w lasix  40mg  IV BID. Fluid restrict ~1.2L/day  Acidosis -stop nahco3 as is exhibiting some contraction alkalosis  DM2 -per primary  DNR.  Discussed with primary service.  Ephriam Stank, MD [facility]   Subjective:   Patient seen and examined bedside. Patient moved to ICU for symptomatic bradycardia and hypotension (HR ~40, BP 77/38 respectively). BP meds held, received NS 500cc bolus, atropine  10mg  x 1, midodrine  10mg  x 1. UOP ~0.7L + 3 unmeasured voids. Net neg ~6L since admit   Objective:   BP (!) 161/49   Pulse 68   Temp  97.8 F (36.6 C) (Oral)   Resp 10   Ht 5' (1.524 m)   Wt 61 kg   SpO2 99%   BMI 26.26 kg/m   Intake/Output Summary (Last 24 hours) at 04/14/2024 0747 Last data filed at 04/14/2024 0600 Gross per 24 hour  Intake 1729.2 ml  Output 750 ml  Net 979.2 ml   Weight change: 4.4 kg  Physical Exam: Gen: NAD CVS: RRR Resp: unlabored, normal wob, CTA b/l Abd: soft Ext: 1+ pitting edema B/L Les Neuro: awake, alert  Imaging: No results found.  Labs: BMET Recent Labs  Lab 04/09/24 0415 04/09/24 1223 04/10/24 0820 04/11/24 0422 04/12/24 0416 04/13/24 0429 04/14/24 0414  NA 125* 124* 126* 126* 129* 133* 129*  K 3.4* 3.6 3.5 3.2* 2.8* 2.5* 3.9  CL 87* 85* 88* 88* 86* 86* 86*  CO2 30 29 28 30 31  34* 33*  GLUCOSE 117* 202* 89 188* 104* 51* 141*  BUN 55* 55* 59* 61* 60* 60* 64*  CREATININE 2.77* 2.67* 2.63* 2.53* 2.59* 2.60* 2.83*  CALCIUM  8.0* 8.3* 8.4* 8.5* 8.9 8.9 8.8*   CBC Recent Labs  Lab 04/07/24 1400 04/08/24 0430 04/09/24 0415 04/12/24 0416 04/13/24 0429 04/14/24 0414  WBC 3.1*   < > 2.9* 4.1 4.3 4.0  NEUTROABS 2.4  --   --   --   --   --   HGB 8.8*   < > 7.9* 8.2* 7.8* 7.5*  HCT 24.7*   < > 22.8* 23.7* 23.5* 23.0*  MCV 90.5   < > 90.8 92.9 93.3 95.4  PLT 163   < > 149* 140* 140* 139*   < > = values in this interval not displayed.    Medications:  sodium chloride    Intravenous Once   Chlorhexidine  Gluconate Cloth  6 each Topical Daily   darbepoetin (ARANESP ) injection - DIALYSIS  60 mcg Subcutaneous Q Wed-1800   feeding supplement  237 mL Oral BID BM   furosemide   40 mg Intravenous Q12H   heparin  injection (subcutaneous)  5,000 Units Subcutaneous Q8H   insulin  aspart  0-5 Units Subcutaneous QHS   insulin  aspart  0-9 Units Subcutaneous TID WC   insulin  glargine-yfgn  6 Units Subcutaneous QHS   sennosides  10 mL Oral BID   sodium bicarbonate   650 mg Oral BID      Ephriam Stank, MD [facility] 04/14/2024, 7:47 AM

## 2024-04-14 NOTE — Progress Notes (Signed)
 Daily Progress Note   Patient Name: Emily Jennings       Date: 04/14/2024 DOB: 07/02/1934  Age: 89 y.o. MRN#: [id number] Attending Physician: Willette Adriana LABOR, MD Primary Care Physician: Antonetta Rollene BRAVO, MD Admit Date: 04/07/2024  Reason for Consultation/Follow-up: Establishing goals of care  Patient Profile/HPI:   89 y.o. female  with past medical history of CHF, DM, CKD4, DM2  admitted on 04/07/2024 with SOB and diffuse swelling. Admitted for IV diuresis and treatment of acute on chronic heart failure and acute on chronic kidney injury. Palliative medicine consulted for goals of care and code status.    Subjective: Chart reviewed including labs, progress notes, imaging from this and previous encounters.  [name] became hypotensive last night and is in ICU. Resolved now. Nephrology note reviewed- Cr is close to baseline. Responding to diuretics. Hgb today is 7.5 and she is receiving blood transfusion.  Today she is sitting up at bedside- eating lunch. Reports feeling much better. She notes that she has always been cold and believes this is due to having low blood.  Reviewed previous GOC discussion- treat what is treatable. She would not want HD.  She does not want hospice. She is ok with rehospitalization if needed.    Review of Systems  Respiratory:  Negative for shortness of breath.      Physical Exam Vitals and nursing note reviewed.  Cardiovascular:     Rate and Rhythm: Normal rate.  Pulmonary:     Effort: Pulmonary effort is normal.  Neurological:     Mental Status: She is alert and oriented to person, place, and time.  Psychiatric:        Mood and Affect: Mood normal.             Vital Signs: BP (!) 185/54   Pulse 80   Temp (!) 97.5 F (36.4 C) (Oral)   Resp 14   Ht  5' (1.524 m)   Wt 61 kg   SpO2 94%   BMI 26.26 kg/m  SpO2: SpO2: 94 % O2 Device: O2 Device: Room Air O2 Flow Rate: O2 Flow Rate (L/min): 3 L/min  Intake/output summary:  Intake/Output Summary (Last 24 hours) at 04/14/2024 1241 Last data filed at 04/14/2024 1148 Gross per 24 hour  Intake 1768.03 ml  Output 650 ml  Net  1118.03 ml   LBM: Last BM Date : 04/14/24 Baseline Weight: Weight: 59.4 kg Most recent weight: Weight: 61 kg       Palliative Assessment/Data: PPS: 50%      Patient Active Problem List   Diagnosis Date Noted   Anemia due to chronic kidney disease 02/04/2024   Type 2 diabetes mellitus with hyperglycemia (HCC) 01/22/2024   Insulin  long-term use (HCC) 12/05/2023   Type 2 diabetes mellitus with other specified complication (HCC) 09/25/2023   Acute kidney injury superimposed on stage 4 chronic kidney disease (HCC) 08/14/2023   Small bowel obstruction (HCC) 07/19/2023   Ischemic bowel disease (HCC) 07/19/2023   Incarcerated left inguinal hernia 07/18/2023   Hypoglycemia 07/18/2023   Uncontrolled type 2 diabetes mellitus with hypoglycemia, with long-term current use of insulin  (HCC) 07/04/2023   Dysphagia 07/04/2023   HCAP (healthcare-associated pneumonia) 07/03/2023   Essential hypertension 06/15/2023   Cerebellar edema (HCC) 05/23/2023   Mixed hyperlipidemia 05/23/2023   DNR (do not resuscitate) 05/17/2023   Vocal cord dysfunction 05/17/2023   DNR (do not resuscitate) discussion 05/17/2023   Goals of care, counseling/discussion 05/17/2023   Right cerebellar intraparenchymal hemorrhage due to hypertension 05/16/2023   Unsteady gait when walking 04/03/2023   Hospital discharge follow-up 03/06/2023   Bilateral kidney stones 03/06/2023   Hip swelling, right 02/27/2023   DKA (diabetic ketoacidosis) (HCC) 02/17/2023   High anion gap metabolic acidosis 02/17/2023   Pseudohyponatremia 02/17/2023   Hypoalbuminemia due to protein-calorie malnutrition (HCC)  02/17/2023   Dehydration 02/17/2023   Hip pain 02/15/2023   Encounter for Medicare annual examination with abnormal findings 10/16/2022   Vitamin D  deficiency 10/16/2022   Acute on chronic congestive heart failure with left ventricular diastolic dysfunction (HCC) 09/13/2022   Chronic kidney disease (CKD) stage G3b/A2, moderately decreased glomerular filtration rate (GFR) between 30-44 mL/min/1.73 square meter and albuminuria creatinine ratio between 30-299 mg/g (HCC) 09/13/2022   Mild aortic valve stenosis 09/13/2022   Dyspnea on exertion 09/05/2022   CHF (congestive heart failure) (HCC) 09/05/2022   Hyperkalemia 04/09/2022   Encounter for support and coordination of transition of care 04/09/2022   Hyperglycemic crisis due to diabetes mellitus (HCC) 03/25/2022   Hyponatremia 03/25/2022   Acute metabolic encephalopathy 03/22/2022   Hypothermia 03/22/2022   Heart murmur 03/22/2022   Bilateral lower extremity edema 03/22/2022   Syncope and collapse 01/13/2021   Lip swelling 10/30/2020   CKD stage 4 due to type 2 diabetes mellitus (HCC) 10/30/2020   Tubular adenoma of colon 10/26/2017   Diabetes (HCC) 07/27/2016   Osteoporosis 07/28/2014   Diabetic hypertension-nephrosis syndrome (HCC) 03/23/2014   Type 2 diabetes mellitus with stage 3a chronic kidney disease, with long-term current use of insulin  (HCC) 09/29/2013   Allergic rhinitis 01/27/2013   GERD (gastroesophageal reflux disease) 01/12/2013   CVD (cardiovascular disease) 07/17/2011   Hypokalemia 08/31/2008   Hyperlipidemia LDL goal <100 07/15/2007   Resistant hypertension 07/15/2007    Palliative Care Assessment & Plan    Assessment/Recommendations/Plan  GOC- continue diuresis and discharge home- would not want dialysis, not ready for hospice   Code Status:   Code Status: Limited: Do not attempt resuscitation (DNR) -DNR-LIMITED -Do Not Intubate/DNI    Prognosis:  Unable to determine  Discharge Planning: Home with  Home Health  Care plan was discussed with patient.  Thank you for allowing the Palliative Medicine Team to assist in the care of this patient.  Total time:  45 mins Prolonged billing:  Time includes:   Preparing to  see the patient (e.g., review of tests) Obtaining and/or reviewing separately obtained history Performing a medically necessary appropriate examination and/or evaluation Counseling and educating the patient/family/caregiver Ordering medications, tests, or procedures Referring and communicating with other health care professionals (when not reported separately) Documenting clinical information in the electronic or other health record Independently interpreting results (not reported separately) and communicating results to the patient/family/caregiver Care coordination (not reported separately) Clinical documentation  Cassondra Stain, AGNP-C Palliative Medicine   Please contact Palliative Medicine Team phone at [phone] for questions and concerns.

## 2024-04-14 NOTE — Consult Note (Addendum)
 Cardiology Consultation   Patient ID: Emily Jennings MRN: [id number]; DOB: [birth year]  Admit date: 04/07/2024 Date of Consult: 04/14/2024  PCP:  Antonetta Rollene BRAVO, MD   [facility] Providers Cardiologist:  Alvan Carrier, MD    Patient Profile: Emily Jennings is a 89 y.o. female with a hx of  chronic HFpEF (07/2022 & 08/2022 60-65%, 04/2023 65-70%, 01/2024 70-75%), valvular heart disease (ECHO 01/2024: mild MVR, mild to moderate AS), HTN, HLD, Type II DM, Stage 3-4 CKD and GERD who is being seen 04/14/2024 for the evaluation of acute HF at the request of Dr. Willette.  History of Present Illness: [name] had a recent hospitalization 6/23-25/2025 for progressive LE swelling, shortness of breath with exertion for several days, 20 pound weight gain and trouble with swallowing. Admitted with acute on chronic HFpEF.  Complicated by Anemia Hgb 8.5 >7.9. GI was consulted and underwent barium swallow evaluation with no significant findings. Diuresed with IV lasix , discontinued lasix  and discharged on Torsemide  60 mg daily.  Continued on amlodipine  10 mg daily, Coreg  12.5 mg BID, clonidine  0.3 mg BID, hydralazine  100 mg TID, Imdur  60 mg daily.   Patient was last seen in heart care OV with me on 04/01/2024 accompanied by husband.  Reported chronic shortness of breath with minimal exertion and relieved with rest that got worse X 2 weeks.  Reported LE edema and orthopnea since leaving hospital.  Reports no improvement since being released from hospital.  Noted approximately 3 days prior to this OV the home nurse increase torsemide  to 40 mg in the a.m. and 40 mg in the p.m.  Medications managed by her husband who reported compliance with medication based off last hospital discharge.  Volume overloaded on exam with 2+ pitting edema bilaterally, right hand edema and bilateral rales in lung bases.  Increase torsemide  to 60 mg a.m. and 40 mg in the p.m.  Also reported home SBP mainly in 160s.  BP in OV was  elevated 178/63 with repeat BP 168/78.  Suspect amlodipine  contributing to edema.  Discontinued amlodipine  and ordered nifedipine  60 mg daily.  Follow-up labs for BMP and kidney function showed still elevated but improved a little and blood count still low but increased.  Presented to [facility] ED 04/07/2024 with complaints of difficulty breathing over the past 2 days, bilateral LE swelling up to the thighs and her arms, and weight gain from 128 lb to 135 lbs. K 4.1 now 3.9, CR 2.66 now 2.83, albumin  2.9, BNP 962 now 31 (04/01/2024 839), TN 6>7, Hgb 8.8 now 7.5.  CXR showed stable mild cardiomegaly and small bilateral pleural effusion.  No new findings. ED treated with IV Lasix  60 mg X1, which was increased to furosemide  80 mg then Lasix  drip which was DC'd on 7/27.  Scheduled to start Lasix  40 mg IV today.  Also hospital course complicated by hypertensive crisis. Treated with the following BP meds which was discontinued: Coreg , clonidine , Cardizem , hydralazine  p.o. and IV, labetalol  p.o. and IV, and nicardipine .  Noted on 04/13/2024 patient with symptomatic bradycardia and hypotension with HR around 40 and BP 77/38 with complaints of dyspnea.  Treated with atropine , midodrine  10 mg p.o. X1 and normal saline bolus, which is when majority of BP medications was discontinued.  On interview, not able to obtain much information from patient when asked about symptoms prior to hospitalization.  Per patient, she has been experiencing shortness of breath and swelling to lower extremity up to thighs and abdomen since last  hospitalization.  She notes remarkable improvement with the breathing and swelling.  Denied any CP, dizziness, palpitations, syncope.  She reports daily compliance with her medications.  Reports low-sodium diet.  Denies any recent illnesses.  Attempted to call husband to obtain more information but did not answer phone.   Past Medical History:  Diagnosis Date   Allergy    Anxiety    ANXIETY DISORDER,  GENERALIZED 07/15/2007   Qualifier: Diagnosis of  By: Aisha Motes     Arthritis    Cancer [facility]) 2009   breast, carcinoma in situ left   Carcinoma in situ of breast 05/21/2008   Qualifier: Diagnosis of  By: Antonetta MD, Margaret  Diagnosed in 2009, completed 5 year course of tamoxifen, no evidence of recurrence    Carotid stenosis    11/16/2005  mild plaque formation and stenosis proximal right ECA   Cataract    Complication of anesthesia    Coronary artery disease    cardiac catheterization on 03/20/2006  LAD mid 40% stenosis, left circumflex mild 40% stenosis, RCA mid-vessel 40% to 50% lesion   EF 60%   Diabetes mellitus    GERD (gastroesophageal reflux disease)    Hernia, inguinal    left   Hyperglycemia    Hypertension    Insomnia 11/16/2011   Low blood potassium    Non-insulin  dependent type 2 diabetes mellitus (HCC)    Osteoporosis    Shortness of breath    2D Echocardiogram 01/26/2009   EF of greater than 55%, mild MR, mild TR, normal ventricular function   Thickened endometrium 10/26/2017   Noted by gyne in 2017, missed 6 month follow up, referred in 09/2017   Ventricular tachycardia, non-sustained (HCC)    developed during stress test 02/08/2006, spontaneously aborted, mild reversible apical defect    Past Surgical History:  Procedure Laterality Date   BOWEL RESECTION Left 07/19/2023   Procedure: SMALL BOWEL RESECTION;  Surgeon: Kallie Manuelita BROCKS, MD;  Location: [facility] ORS;  Service: General;  Laterality: Left;   BREAST LUMPECTOMY Left 2009   Left breast 2009   CATARACT EXTRACTION W/PHACO Left 10/28/2014   Procedure: PHACO EMULSION CATARACT EXTRACTION WITH INTRAOCULAR LENS IMPLANT LEFT EYE (IOC);  Surgeon: Gaither Quan, MD;  Location[facility];  Service: Ophthalmology;  Laterality: Left;   COLONOSCOPY     cyst removed from left foot     INGUINAL HERNIA REPAIR Left 07/19/2023   Procedure: HERNIA REPAIR FEMORAL INCARCERATED;  Surgeon: Kallie Manuelita BROCKS, MD;  Location: [facility] ORS;   Service: General;  Laterality: Left;   REFRACTIVE SURGERY Left      Home Medications:  Prior to Admission medications   Medication Sig Start Date End Date Taking? Authorizing Provider  albuterol  (VENTOLIN  HFA) 108 (90 Base) MCG/ACT inhaler Inhale 2 puffs into the lungs every 2 (two) hours as needed for wheezing or shortness of breath. 02/13/24  Yes Emokpae, Courage, MD  carvedilol  (COREG ) 12.5 MG tablet Take 1 tablet (12.5 mg total) by mouth 2 (two) times daily with a meal. 02/13/24  Yes Emokpae, Courage, MD  cloNIDine  (CATAPRES ) 0.3 MG tablet Take 1 tablet (0.3 mg total) by mouth 2 (two) times daily. 02/13/24  Yes Emokpae, Courage, MD  hydrALAZINE  (APRESOLINE ) 100 MG tablet Take 1 tablet (100 mg total) by mouth 3 (three) times daily. 02/13/24  Yes Pearlean Manus, MD  insulin  degludec (TRESIBA ) 200 UNIT/ML FlexTouch Pen Inject 6 Units into the skin daily at 10 pm. 02/13/24  Yes Pearlean Manus, MD  insulin   lispro (HUMALOG  KWIKPEN) 100 UNIT/ML KwikPen Inject 0-12 Units into the skin 3 (three) times daily. Short acting Humalog  insulin  per sliding scale 0-12 ---:-- Insulin  injection 0-12 Units 0-12 Units Subcutaneous, 3 times daily with meals CBG 70 - 120: 0 unit CBG 121 - 150: 0 unit  CBG 151 - 200: 2 unit CBG 201 - 250: 4 units CBG 251 - 300: 6 units CBG 301 - 350: 8 units  CBG 351 - 400: 10 units  CBG > 400: 12 units 02/13/24  Yes Emokpae, Courage, MD  isosorbide  mononitrate (IMDUR ) 60 MG 24 hr tablet Take 1 tablet (60 mg total) by mouth daily. 02/13/24  Yes Emokpae, Courage, MD  NIFEdipine  (PROCARDIA  XL/NIFEDICAL XL) 60 MG 24 hr tablet Take 1 tablet (60 mg total) by mouth daily. 04/01/24  Yes Sheron Lorette GRADE, PA-C  Potassium Chloride  ER 20 MEQ TBCR Take 1 tablet by mouth daily. 02/10/24  Yes [provider]  RESTASIS  0.05 % ophthalmic emulsion Place 1 drop into both eyes 2 (two) times daily. 11/01/23  Yes [provider]  rosuvastatin  (CRESTOR ) 5 MG tablet Take 1 tablet (5 mg total)  by mouth daily. 02/13/24  Yes Emokpae, Courage, MD  torsemide  (DEMADEX ) 20 MG tablet Take 60 mg ( 3 Tablets) in the Morning and Take 40 mg ( 2 Tablets) in the Afternoon. Patient taking differently: Take 40-60 mg by mouth See admin instructions. Take 60 mg ( 3 Tablets) in the Morning and Take 40 mg ( 2 Tablets) in the Afternoon. 04/01/24  Yes Sye Schroepfer, Lorette GRADE, PA-C  magnesium  oxide (MAG-OX) 400 (240 Mg) MG tablet Take 1 tablet (400 mg total) by mouth daily. 02/13/24   Pearlean Manus, MD    Scheduled Meds:  sodium chloride    Intravenous Once   Chlorhexidine  Gluconate Cloth  6 each Topical Daily   darbepoetin (ARANESP ) injection - DIALYSIS  60 mcg Subcutaneous Q Wed-1800   feeding supplement  237 mL Oral BID BM   furosemide   40 mg Intravenous Q12H   heparin  injection (subcutaneous)  5,000 Units Subcutaneous Q8H   insulin  aspart  0-5 Units Subcutaneous QHS   insulin  aspart  0-9 Units Subcutaneous TID WC   insulin  glargine-yfgn  6 Units Subcutaneous QHS   sennosides  10 mL Oral BID   sodium bicarbonate   650 mg Oral BID   Continuous Infusions:  PRN Meds: ipratropium-albuterol , labetalol   Allergies:    Allergies  Allergen Reactions   Farxiga [Dapagliflozin] Other (See Comments)    DKA   Metformin  And Related Diarrhea    Lost appetite and weight    Benadryl [Diphenhydramine Hcl] Hypertension   Citalopram Other (See Comments)    Unknown    Tramadol  Other (See Comments)    Felt light headed and dizzy   Amlodipine  Swelling   Crestor  [Rosuvastatin ] Swelling and Other (See Comments)    Feet swelling Makes her feel weak. Pt takes 5mg  at home    Social History:   Social History   Socioeconomic History   Marital status: Married    Spouse name: Not on file   Number of children: 2   Years of education: na   Highest education level: Some college, no degree  Occupational History   Occupation: retired    Associate Professor: retired  Tobacco Use   Smoking status: Never    Passive exposure:  Never   Smokeless tobacco: Never  Vaping Use   Vaping status: Never Used  Substance and Sexual Activity   Alcohol use: No  Drug use: No   Sexual activity: Not Currently    Birth control/protection: Post-menopausal  Other Topics Concern   Not on file  Social History Narrative   Lives with her husband and son and attends yoga at the [facility] 2 days a week and speaks to her grandson nightly on the phone      Family History:   Family History  Problem Relation Age of Onset   Hypertension Mother    Hyperlipidemia Mother    Stroke Mother    Urticaria Mother    Cancer Father        pancreatic   Colon cancer Father    Heart disease Brother 40       bypass   Heart disease Brother 52       bypass   Arthritis Other    Asthma Other    Diabetes Other    Colon cancer Paternal Aunt    Esophageal cancer Neg Hx    Stomach cancer Neg Hx    Rectal cancer Neg Hx      ROS:  Please see the history of present illness.  All other ROS reviewed and negative.     Physical Exam/Data: Vitals:   04/14/24 0500 04/14/24 0608 04/14/24 0700 04/14/24 0721  BP: (!) 167/52 (!) 167/49 (!) 161/49   Pulse: 60 69 68   Resp: 11 16 10    Temp:    97.8 F (36.6 C)  TempSrc:    Oral  SpO2: 99% 99% 99%   Weight:      Height:        Intake/Output Summary (Last 24 hours) at 04/14/2024 0747 Last data filed at 04/14/2024 0600 Gross per 24 hour  Intake 1729.2 ml  Output 750 ml  Net 979.2 ml      04/14/2024    4:30 AM 04/13/2024    6:39 PM 04/13/2024    5:00 AM  Last 3 Weights  Weight (lbs) 134 lb 7.7 oz 134 lb 7.7 oz 124 lb 12.5 oz  Weight (kg) 61 kg 61 kg 56.6 kg     Body mass index is 26.26 kg/m.  General:  Well nourished, well developed, in no acute distress HEENT: normal Neck: + JVD Vascular: No carotid bruits; Distal pulses 2+ bilaterally Cardiac:  normal S1, S2; RRR; systolic murmur in left upper sternal border 3/6  Lungs:  diminished lung  sounds in bases  Abd: soft, nontender, no  hepatomegaly  Ext: 1+ pitting edema Musculoskeletal:  No deformities, BUE and BLE strength normal and equal Skin: warm and dry  Neuro:  CNs 2-12 intact, no focal abnormalities noted Psych:  Normal affect   EKG:  The EKG was personally reviewed and demonstrates:  no new EKG  Reviewed 04/07/2024 EKG: Sinus bradycardia, HR 58, prolonged PR 232 MS Telemetry:  Telemetry was personally reviewed and demonstrates:  NSR and SB with 1st degree AV block with HR as low as 35 bpm on 7/27 at 1845 and 2100. This am mainly 60-70's   Relevant CV Studies: ECHO 02/05/2024 IMPRESSIONS   1. Left ventricular ejection fraction, by estimation, is 70 to 75%. The  left ventricle has hyperdynamic function. The left ventricle has no  regional wall motion abnormalities. There is moderate asymmetric left  ventricular hypertrophy. Left ventricular  diastolic parameters are consistent with Grade II diastolic dysfunction  (pseudonormalization).   2. Right ventricular systolic function is normal. The right ventricular  size is normal. Mildly increased right ventricular wall thickness.   3. Left atrial  size was severely dilated.   4. Right atrial size was moderately dilated.   5. Elongated and thickened mitral chordae There is systolic anteror  motion of chord into LVOT during systole Similar to findings in echo from  August 2024. [name] The mitral valve is myxomatous. Mild mitral valve  regurgitation.   6. AV is thickened, calcified with restricted motion. Peak and mean  gradients through the valve are 46 and 24 mm Hg respectively. AVA (VTI) is  1.15 cm2. Dimensionless indext is 0.45 Ovreall consistent with mild to  moderate AS. [name] The aortic valve is  tricuspid. Aortic valve regurgitation is trivial.   7. The inferior vena cava is dilated in size with >50% respiratory  variability, suggesting right atrial pressure of 8 mmHg.   Laboratory Data: High Sensitivity Troponin:   Recent Labs  Lab 04/07/24 1400 04/07/24 1750   TROPONINIHS 6 7     Chemistry Recent Labs  Lab 04/09/24 0415 04/09/24 1223 04/12/24 0416 04/13/24 0429 04/14/24 0414  NA 125*   < > 129* 133* 129*  K 3.4*   < > 2.8* 2.5* 3.9  CL 87*   < > 86* 86* 86*  CO2 30   < > 31 34* 33*  GLUCOSE 117*   < > 104* 51* 141*  BUN 55*   < > 60* 60* 64*  CREATININE 2.77*   < > 2.59* 2.60* 2.83*  CALCIUM  8.0*   < > 8.9 8.9 8.8*  MG 2.2  --  2.2  --   --   GFRNONAA 16*   < > 17* 17* 16*  ANIONGAP 8   < > 12 13 10    < > = values in this interval not displayed.    Recent Labs  Lab 04/07/24 1400  PROT 5.2*  ALBUMIN  2.9*  AST 23  ALT 15  ALKPHOS 74  BILITOT 0.7   Lipids No results for input(s): CHOL, TRIG, HDL, LABVLDL, LDLCALC, CHOLHDL in the last 168 hours.  Hematology Recent Labs  Lab 04/12/24 0416 04/13/24 0429 04/14/24 0414  WBC 4.1 4.3 4.0  RBC 2.55* 2.52* 2.41*  HGB 8.2* 7.8* 7.5*  HCT 23.7* 23.5* 23.0*  MCV 92.9 93.3 95.4  MCH 32.2 31.0 31.1  MCHC 34.6 33.2 32.6  RDW 13.3 13.3 13.7  PLT 140* 140* 139*   Thyroid  No results for input(s): TSH, FREET4 in the last 168 hours.  BNP Recent Labs  Lab 04/07/24 1400  BNP 962.0*    DDimer No results for input(s): DDIMER in the last 168 hours.  Radiology/Studies:  No results found.   Assessment and Plan: Acute on Chronic heart failure with preserved ejection fraction (HCC) 07/2022 & 08/2022 60-65%, 04/2023 65-70%, 01/2024 70-75% ECHO 01/2024: EF 70 to 75%, moderate asymmetric LVH, G2 DD, severely dilated LA, moderately dilated RA Last OV 04/01/2024 reported worsening SOB, LE edema and orthopnea.  Increased torsemide  to 60 mg a.m. and 40 mg in the p.m. Continued on Coreg  12.5 mg twice daily and KCl 20 mEq.  On interview, not able to obtain much information from patient when asked about symptoms prior to hospitalization.  Per patient, she has been experiencing shortness of breath and swelling to lower extremity up to thighs and abdomen since last hospitalization.   She notes remarkable improvement with the breathing and swelling.  Attempted to contact husband for more information but no answer and left voicemail. BNP 962 now 31 (04/01/2024 839)  ED treated with IV Lasix  60 mg X1, which was  increased to furosemide  80 mg then Lasix  drip which was DC'd on 7/27.  Scheduled to start Lasix  40 mg IV today.   I/O's -5252, wt 136 >134 (11/2023: 112 lbs, 7/15 130 lbs), Cr 2.83. REDS this am 29 WNL.  On exam, appears volume overloaded with 1+ pitting edema and JVD.  Continue with Lasix  as planned above. Can consider limited echo to reassess EF and LV function.  Will discuss with MD. Order Coreg  3.125 mg. Based on response, can consider increasing dose  Continue to follow I's/O's, daily weights, renal function and reds clips measurement Defer SGLT2i given her renal function as below.     Hypertensive crisis Essential hypertension Last OV 04/01/2024 BP elevated.  Discontinued amlodipine  since suspected contributing to edema. Ordered nifedipine  60 mg daily.  Increase torsemide  to 60 mg a.m. and 40 mg in the p.m.  Continued on Coreg  12.5 mg twice daily, clonidine  0.3 mg twice daily, hydralazine  100 mg 3 times daily and Imdur  60 mg daily. BP this a.m. 181/68 Treated with the following BP meds which was discontinued: Coreg , clonidine , Cardizem , hydralazine  p.o. and IV, labetalol  p.o. and IV, and nicardipine  Order Hydralazine  50 mg TID. Continue to reassess BP. Based on response, can consider increasing dose  or adding Clonidine   Will defer diuretic to Nephrology given her CKD.    Bradycardia  Noted on 04/13/2024 patient with symptomatic bradycardia and hypotension with HR around 40 and BP 77/38 with complaints of dyspnea.  Treated with atropine , midodrine  10 mg p.o. X1 and normal saline bolus, which is when majority of BP medications was discontinued. Denies any dizziness or syncope.   NSR and SB with 1st degree AV block with HR as low as 35 bpm on 7/27 at 1845 and 2100.  This am mainly 60-70's since midnight.  No signs of 2nd or 3rd degree AV block. Can consider heart monitor upon discharge. Will discuss with MD.  Continue to monitor response with restarting BP medication as above.   Hypokalemia, hypomagnesemia, resolved  Treated with KCl supplement and mag. This a.m. K3.9.  Mg 2.2 on 04/12/24.   Hyperlipidemia LDL goal <70 11/2023 LDL 59 Managed by her PCP  Continue on Crestor  5 mg daily.   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 Also noted on echo in 04/2023 as well with only mild MR and mild to moderate aortic stenosis.  ECHO 01/2024: mild MVR, mild to moderate AS Will plan to repeat ECHO in 01/2025.  Previously noted, she most likely not a candidate for TAVR given inability to undergo catheterization and pre-procedure CT's due to her CKD.    CKD (chronic kidney disease) stage 4, GFR 15-29 ml/min (HCC) This a.m. CR 2.83 Followed by Dr. Rachele. Defer diuretic management to nephorology.    Chronic Anemia  Recent hospitalization in 02/2024 with Hgb 8.5 >7.9 This hospitalization Hgb initially 8.8 now 7.5 Managed per primary team.   [place]  Heart Association (NYHA) Functional Class NYHA Class III       For questions or updates, please contact [facility] Please consult [email or web address] for contact info under    Signed, Lorette CINDERELLA Kapur, PA-C  04/14/2024 7:47 AM

## 2024-04-15 LAB — COMPREHENSIVE METABOLIC PANEL WITH GFR
ALT: 29 U/L (ref 0–44)
AST: 41 U/L (ref 15–41)
Albumin: 3.1 g/dL — ABNORMAL LOW (ref 3.5–5.0)
Alkaline Phosphatase: 89 U/L (ref 38–126)
Anion gap: 11 (ref 5–15)
BUN: 68 mg/dL — ABNORMAL HIGH (ref 8–23)
CO2: 36 mmol/L — ABNORMAL HIGH (ref 22–32)
Calcium: 9.2 mg/dL (ref 8.9–10.3)
Chloride: 85 mmol/L — ABNORMAL LOW (ref 98–111)
Creatinine, Ser: 2.6 mg/dL — ABNORMAL HIGH (ref 0.44–1.00)
GFR, Estimated: 17 mL/min — ABNORMAL LOW (ref >60)
Glucose, Bld: 90 mg/dL (ref 70–99)
Potassium: 3 mmol/L — ABNORMAL LOW (ref 3.5–5.1)
Sodium: 132 mmol/L — ABNORMAL LOW (ref 135–145)
Total Bilirubin: 0.8 mg/dL (ref 0.0–1.2)
Total Protein: 5.5 g/dL — ABNORMAL LOW (ref 6.5–8.1)

## 2024-04-15 LAB — GLUCOSE, CAPILLARY
Glucose-Capillary: 35 mg/dL — CL (ref 70–99)
Glucose-Capillary: 42 mg/dL — CL (ref 70–99)
Glucose-Capillary: 49 mg/dL — ABNORMAL LOW (ref 70–99)
Glucose-Capillary: 99 mg/dL (ref 70–99)
Glucose-Capillary: 131 mg/dL — ABNORMAL HIGH (ref 70–99)
Glucose-Capillary: 102 mg/dL — ABNORMAL HIGH (ref 70–99)
Glucose-Capillary: 251 mg/dL — ABNORMAL HIGH (ref 70–99)

## 2024-04-15 LAB — TYPE AND SCREEN
ABO/RH(D): O POS
Antibody Screen: NEGATIVE
Unit division: 0

## 2024-04-15 LAB — BPAM RBC
Blood Product Expiration Date: 202508212359
ISSUE DATE / TIME: 202507280933
Unit Type and Rh: 5100

## 2024-04-15 LAB — CBC
HCT: 29.8 % — ABNORMAL LOW (ref 36.0–46.0)
Hemoglobin: 9.9 g/dL — ABNORMAL LOW (ref 12.0–15.0)
MCH: 30.6 pg (ref 26.0–34.0)
MCHC: 33.2 g/dL (ref 30.0–36.0)
MCV: 92 fL (ref 80.0–100.0)
Platelets: 120 K/uL — ABNORMAL LOW (ref 150–400)
RBC: 3.24 MIL/uL — ABNORMAL LOW (ref 3.87–5.11)
RDW: 14.3 % (ref 11.5–15.5)
WBC: 5.1 K/uL (ref 4.0–10.5)
nRBC: 0 % (ref 0.0–0.2)

## 2024-04-15 LAB — BRAIN NATRIURETIC PEPTIDE: B Natriuretic Peptide: 2257 pg/mL — ABNORMAL HIGH (ref 0.0–100.0)

## 2024-04-15 MED ORDER — POTASSIUM CHLORIDE CRYS ER 20 MEQ PO TBCR
40.0000 meq | EXTENDED_RELEASE_TABLET | Freq: Once | ORAL | Status: AC
  Administered 2024-04-15: 40 meq via ORAL
  Filled 2024-04-15: qty 2

## 2024-04-15 MED ORDER — DILTIAZEM HCL 60 MG PO TABS
60.0000 mg | ORAL_TABLET | Freq: Four times a day (QID) | ORAL | Status: AC
  Administered 2024-04-15 – 2024-04-21 (×22): 60 mg via ORAL
  Filled 2024-04-15 (×21): qty 1

## 2024-04-15 MED ORDER — DILTIAZEM HCL 30 MG PO TABS
30.0000 mg | ORAL_TABLET | Freq: Four times a day (QID) | ORAL | Status: DC
  Administered 2024-04-15 (×2): 30 mg via ORAL
  Filled 2024-04-15 (×3): qty 1

## 2024-04-15 MED ORDER — FUROSEMIDE 10 MG/ML IJ SOLN: 80.0000 mg | Freq: Two times a day (BID) | INTRAMUSCULAR | Status: DC

## 2024-04-15 MED ORDER — FUROSEMIDE 10 MG/ML IJ SOLN
80.0000 mg | Freq: Two times a day (BID) | INTRAMUSCULAR | Status: AC
  Administered 2024-04-15 (×2): 80 mg via INTRAVENOUS
  Filled 2024-04-15 (×2): qty 8

## 2024-04-15 MED ORDER — HYDRALAZINE HCL 20 MG/ML IJ SOLN
10.0000 mg | Freq: Once | INTRAMUSCULAR | Status: AC
  Administered 2024-04-15: 10 mg via INTRAVENOUS
  Filled 2024-04-15: qty 1

## 2024-04-15 MED ORDER — FUROSEMIDE 10 MG/ML IJ SOLN
80.0000 mg | Freq: Once | INTRAMUSCULAR | Status: AC
  Administered 2024-04-15: 80 mg via INTRAVENOUS
  Filled 2024-04-15: qty 8

## 2024-04-15 MED ORDER — HYDRALAZINE HCL 50 MG PO TABS
100.0000 mg | ORAL_TABLET | Freq: Three times a day (TID) | ORAL | Status: DC
  Administered 2024-04-15 – 2024-04-21 (×18): 100 mg via ORAL
  Filled 2024-04-15 (×20): qty 2

## 2024-04-15 NOTE — Progress Notes (Signed)
   04/15/24 0119  Vitals  BP (!) 240/110  BP Method Manual  MEWS COLOR  MEWS Score Color Yellow  MEWS Score  MEWS Temp 0  MEWS Systolic 2  MEWS Pulse 0  MEWS RR 0  MEWS LOC 0  MEWS Score 2   Order to give PRN labetalol  and Dr. Steffani placed order for IV hydralazine , ordered to give both.

## 2024-04-15 NOTE — Plan of Care (Signed)

## 2024-04-15 NOTE — Progress Notes (Signed)
 Mobility Specialist Progress Note:    04/15/24 1300  Mobility  Activity Ambulated with assistance;Pivoted/transferred from chair to bed  Level of Assistance Minimal assist, patient does 75% or more  Assistive Device Front wheel walker  Distance Ambulated (ft) 15 ft  Range of Motion/Exercises Active;All extremities  Activity Response Tolerated well  Mobility Referral Yes  Mobility visit 1 Mobility  Mobility Specialist Start Time (ACUTE ONLY) 1232  Mobility Specialist Stop Time (ACUTE ONLY) 1250  Mobility Specialist Time Calculation (min) (ACUTE ONLY) 18 min   Pt received requesting assistance to bathroom. Required MinA to stand and ambulate with RW. Tolerated well, asx throughout. Left pt supine, alarm on. NT in room, all needs met.   Sherrilee Ditty Mobility Specialist Please contact via Special educational needs teacher or  Rehab office at [phone]

## 2024-04-15 NOTE — Progress Notes (Signed)
   04/15/24 1708  Vitals  BP (!) 254/87  MAP (mmHg) 133  BP Method Automatic  Pulse Rate 70  Pulse Rate Source Monitor  MEWS COLOR  MEWS Score Color Yellow  Oxygen Therapy  SpO2 96 %  MEWS Score  MEWS Temp 0  MEWS Systolic 2  MEWS Pulse 0  MEWS RR 0  MEWS LOC 0  MEWS Score 2  Provider Notification  Provider Name/Title MD Shahmehdi  Date Provider Notified 04/15/24  Time Provider Notified [phone]  Provider response No new orders   Worsening swelling/edema and wheezing, unable to obtain true bladder scan reading, patient has not urinated in several hours. MD Notified.

## 2024-04-15 NOTE — Inpatient Diabetes Management (Signed)
 Inpatient Diabetes Program Recommendations  AACE/ADA: New Consensus Statement on Inpatient Glycemic Control   Target Ranges:  Prepandial:   less than 140 mg/dL      Peak postprandial:   less than 180 mg/dL (1-2 hours)      Critically ill patients:  140 - 180 mg/dL     Latest Reference Range & Units 04/15/24 07:21 04/15/24 07:30 04/15/24 08:27  Glucose-Capillary 70 - 99 mg/dL 35 (LL) 42 (LL) 49 (L)   Review of Glycemic Control  Diabetes history: DM2 Outpatient Diabetes medications: Tresiba  6 units at bedtime, Humalog  0-12 units TID with meals Current orders for Inpatient glycemic control: Semglee  6 units at bedtime, Novolog  0-5 units QHS  Inpatient Diabetes Program Recommendations:    Insulin : CBG 35 mg/dl at 2:78 am today.  Please consider discontinuing Semglee  and adding Novolog  0-6 units TID with meals.  Thanks, Earnie Gainer, RN, MSN, CDCES Diabetes Coordinator Inpatient Diabetes Program [phone] (Team Pager from 8am to 5pm)'

## 2024-04-15 NOTE — Progress Notes (Signed)
 Physical Therapy Treatment Patient Details Name: Emily Jennings MRN: [id number] DOB: 08-Jul-1934 Today's Date: 04/15/2024   History of Present Illness Emily Jennings is a 89 y.o. female with medical history significant for CHF, diabetes mellitus, hypertension, CKD 4.  Patient presented to the ED with complaints of difficulty breathing over the past 2 days, swelling to bilateral lower extremities up to her thighs, swelling of her arms.  She reports weight gain from 128-135 today.  She reports compliance with her medication-torsemide  60/40mg . Torsemide  dose was increased 7/15. Recent hospitalizations for same-most recent 6/23 to 6/25.    PT Comments  REASSESSMENT: Patient demonstrates good return for sitting up at bedside and can complete sit to stands without AD, but safer using RW when taking steps, tolerated walking to bathroom and transferring to/from commode without loss of balance using RW, able to wash hands while standing in front of sink using RW and tolerated sitting up in chair after therapy. Patient noted to have BP of 239/91 after therapy with no c/o headaches or discomfort - nurse notified. Patient will benefit from continued skilled physical therapy in hospital and recommended venue below to increase strength, balance, endurance for safe ADLs and gait.     If plan is discharge home, recommend the following: Help with stairs or ramp for entrance;Assist for transportation;Assistance with cooking/housework;A little help with walking and/or transfers;A little help with bathing/dressing/bathroom   Can travel by private vehicle     Yes  Equipment Recommendations  None recommended by PT    Recommendations for Other Services       Precautions / Restrictions Precautions Precautions: Fall Recall of Precautions/Restrictions: Intact Restrictions Weight Bearing Restrictions Per Provider Order: No     Mobility  Bed Mobility Overal bed mobility: Needs Assistance Bed Mobility: Supine to  Sit     Supine to sit: Supervision, HOB elevated     General bed mobility comments: good return for sitting up at bedside with HOB partially raised    Transfers Overall transfer level: Needs assistance Equipment used: Rolling walker (2 wheels) Transfers: Sit to/from Stand, Bed to chair/wheelchair/BSC Sit to Stand: Contact guard assist   Step pivot transfers: Contact guard assist       General transfer comment: slightly labored movement without loss of balance using RW    Ambulation/Gait Ambulation/Gait assistance: Contact guard assist, Min assist Gait Distance (Feet): 15 Feet Assistive device: Rolling walker (2 wheels) Gait Pattern/deviations: Decreased step length - right, Decreased step length - left, Trunk flexed, Narrow base of support, Decreased stride length Gait velocity: decreased     General Gait Details: slow slightly labored movement without loss of balance, limited mostly due to fatigue   Stairs             Wheelchair Mobility     Tilt Bed    Modified Rankin (Stroke Patients Only)       Balance Overall balance assessment: Needs assistance Sitting-balance support: Feet supported, No upper extremity supported Sitting balance-Leahy Scale: Good Sitting balance - Comments: seated at EOB   Standing balance support: During functional activity, No upper extremity supported Standing balance-Leahy Scale: Poor Standing balance comment: fair/good using RW                            Communication Communication Communication: No apparent difficulties  Cognition Arousal: Alert Behavior During Therapy: WFL for tasks assessed/performed   PT - Cognitive impairments: No apparent impairments  Following commands: Intact      Cueing Cueing Techniques: Verbal cues, Tactile cues  Exercises General Exercises - Lower Extremity Long Arc Quad: AROM, Strengthening, Both, 10 reps, Seated Hip Flexion/Marching:  Seated, AROM, Strengthening, Both, 10 reps Toe Raises: Seated, AROM, Strengthening, Both, 10 reps Heel Raises: Seated, AROM, Strengthening, Both, 10 reps    General Comments        Pertinent Vitals/Pain Pain Assessment Pain Assessment: No/denies pain    Home Living                          Prior Function            PT Goals (current goals can now be found in the care plan section) Acute Rehab PT Goals Patient Stated Goal: return home PT Goal Formulation: With patient Time For Goal Achievement: 04/23/24 Potential to Achieve Goals: Good Progress towards PT goals: Progressing toward goals    Frequency    Min 3X/week      PT Plan      Co-evaluation              AM-PAC PT 6 Clicks Mobility   Outcome Measure  Help needed turning from your back to your side while in a flat bed without using bedrails?: None Help needed moving from lying on your back to sitting on the side of a flat bed without using bedrails?: A Little Help needed moving to and from a bed to a chair (including a wheelchair)?: A Little Help needed standing up from a chair using your arms (e.g., wheelchair or bedside chair)?: None Help needed to walk in hospital room?: A Little Help needed climbing 3-5 steps with a railing? : A Lot 6 Click Score: 19    End of Session   Activity Tolerance: Patient tolerated treatment well;Patient limited by fatigue Patient left: in chair;with call bell/phone within reach;with chair alarm set Nurse Communication: Mobility status PT Visit Diagnosis: Unsteadiness on feet (R26.81);Other abnormalities of gait and mobility (R26.89);Muscle weakness (generalized) (M62.81)     Time: 8990-8956 PT Time Calculation (min) (ACUTE ONLY): 34 min  Charges:    $Therapeutic Exercise: 8-22 mins $Therapeutic Activity: 8-22 mins PT General Charges $$ ACUTE PT VISIT: 1 Visit                     12:25 PM, 04/15/24 Lynwood Music, MPT Physical Therapist with  [facility] 336 [phone] office [phone] mobile phone

## 2024-04-15 NOTE — Progress Notes (Signed)
   Progress Note  Patient Name: Emily Jennings Date of Encounter: 04/15/2024  Primary Cardiologist: Alvan Carrier, MD  Acute on chronic diastolic heart failure and hypertensive urgency management per nephrology due to CKD stage IV.  Per nephrology and palliative, not a candidate for dialysis due to poor functional status but patient also refused dialysis and prefer comfort measures.  Cardiology will sign off.  Please call us  back if you have any new questions.  Signed, Diannah SHAUNNA [place]wood, MD  04/15/2024, 9:56 AM

## 2024-04-15 NOTE — Progress Notes (Signed)
 [facility] Progress Note    Assessment/ Plan:   AKI on CKD4-5 -AKI likely secondary to cardiorenal. Cr currently not far off as compared to baseline, down to 2.6 today. C/w diuresis -poor dialysis candidate (age, functional status). Followed by Dr. Rachele as an outpatient. Palliative care has seen--patient confirms that she does NOT want dialysis. -Avoid nephrotoxic medications including NSAIDs and iodinated intravenous contrast exposure unless the latter is absolutely indicated.  Preferred narcotic agents for pain control are hydromorphone, fentanyl , and methadone. Morphine  should not be used. Avoid Baclofen and avoid oral sodium phosphate  and magnesium  citrate based laxatives / bowel preps. Continue strict Input and Output monitoring. Will monitor the patient closely with you and intervene or adjust therapy as indicated by changes in clinical status/labs   Acute on chronic HFpEF -lasix  held 7/28 given normal reds vest reading of 29% however clinically she is volume overloaded and is exhibiting some SOB, BNP back up from 31 yesterday to 2,257 today. Lasix  80mg  IV BID x 1 day today. Hopefully can transition to torsemide  60mg  BID eventually  Hypertensive Crisis Hypotension-resolved Bradycardia-resolved -on cardizem  30mg  every 6 hours -hydralazine  increased to 100mg  q8h -lasix  80mg  iv BID x 1 day as above -if BP is still high then recommend restarting clonidine  and then Imdur   Anemia of CKD -transfuse PRN for Hgb <7- s/p 1u prbc 7/28 -c/w aranesp  60 mcg qweekly  Hypervolemic Hyponatremia -Fluid restrict ~1.2L/day -na up to 132 today. Diuresis as above  Acidosis -stopped nahco3 as labs are exhibiting contraction alkalosis  DM2 -per primary  Hypokalemia -replete PRN, to receive kcl 40meq x 1 today  DNR.  Discussed with primary service.  Ephriam Stank, MD [facility]   Subjective:   Patient seen and examined bedside. Hypertensive this AM,  received meds. Lasix  held yesterday by cardiology since reds vest was 29%. Uop 0.2L + 4 unmeasured voids Patient reports feeling a little more SOB this AM, required breathing treatment and lasix  overnight. No other complaints.   Objective:   BP (!) 241/88 (BP Location: Left Arm)   Pulse 69   Temp 97.8 F (36.6 C) (Oral)   Resp 18   Ht 5' (1.524 m)   Wt 59 kg   SpO2 100%   BMI 25.40 kg/m   Intake/Output Summary (Last 24 hours) at 04/15/2024 0751 Last data filed at 04/14/2024 1338 Gross per 24 hour  Intake 423 ml  Output 200 ml  Net 223 ml   Weight change: -1.2 kg  Physical Exam: Gen: NAD CVS: RRR Resp: fine rales bibasilar, [place] in place Abd: soft Ext: 1+ pitting edema B/L Les Neuro: awake, alert  Imaging: No results found.  Labs: BMET Recent Labs  Lab 04/09/24 1223 04/10/24 0820 04/11/24 0422 04/12/24 0416 04/13/24 0429 04/14/24 0414 04/15/24 0458  NA 124* 126* 126* 129* 133* 129* 132*  K 3.6 3.5 3.2* 2.8* 2.5* 3.9 3.0*  CL 85* 88* 88* 86* 86* 86* 85*  CO2 29 28 30 31  34* 33* 36*  GLUCOSE 202* 89 188* 104* 51* 141* 90  BUN 55* 59* 61* 60* 60* 64* 68*  CREATININE 2.67* 2.63* 2.53* 2.59* 2.60* 2.83* 2.60*  CALCIUM  8.3* 8.4* 8.5* 8.9 8.9 8.8* 9.2   CBC Recent Labs  Lab 04/12/24 0416 04/13/24 0429 04/14/24 0414 04/15/24 0458  WBC 4.1 4.3 4.0 5.1  HGB 8.2* 7.8* 7.5* 9.9*  HCT 23.7* 23.5* 23.0* 29.8*  MCV 92.9 93.3 95.4 92.0  PLT 140* 140* 139* 120*    Medications:  Chlorhexidine  Gluconate Cloth  6 each Topical Daily   darbepoetin (ARANESP ) injection - DIALYSIS  60 mcg Subcutaneous Q Wed-1800   diltiazem   30 mg Oral Q6H   feeding supplement  237 mL Oral BID BM   heparin  injection (subcutaneous)  5,000 Units Subcutaneous Q8H   hydrALAZINE   100 mg Oral Q8H   insulin  aspart  0-5 Units Subcutaneous QHS   insulin  glargine-yfgn  6 Units Subcutaneous QHS   potassium chloride   40 mEq Oral Once   sennosides  10 mL Oral BID      Ephriam Stank,  MD [facility] 04/15/2024, 7:51 AM

## 2024-04-15 NOTE — Plan of Care (Signed)
   Problem: Activity: Goal: Risk for activity intolerance will decrease Outcome: Progressing   Problem: Coping: Goal: Level of anxiety will decrease Outcome: Progressing

## 2024-04-16 ENCOUNTER — Encounter (HOSPITAL_COMMUNITY): Payer: Self-pay | Admitting: Internal Medicine

## 2024-04-16 ENCOUNTER — Inpatient Hospital Stay (HOSPITAL_COMMUNITY)

## 2024-04-16 DIAGNOSIS — I5033 Acute on chronic diastolic (congestive) heart failure: Secondary | ICD-10-CM | POA: Diagnosis not present

## 2024-04-16 DIAGNOSIS — Z66 Do not resuscitate: Secondary | ICD-10-CM | POA: Diagnosis not present

## 2024-04-16 DIAGNOSIS — I169 Hypertensive crisis, unspecified: Secondary | ICD-10-CM

## 2024-04-16 DIAGNOSIS — E1322 Other specified diabetes mellitus with diabetic chronic kidney disease: Secondary | ICD-10-CM | POA: Diagnosis not present

## 2024-04-16 DIAGNOSIS — E871 Hypo-osmolality and hyponatremia: Secondary | ICD-10-CM | POA: Diagnosis not present

## 2024-04-16 LAB — COMPREHENSIVE METABOLIC PANEL WITH GFR
ALT: 26 U/L (ref 0–44)
AST: 33 U/L (ref 15–41)
Albumin: 3 g/dL — ABNORMAL LOW (ref 3.5–5.0)
Alkaline Phosphatase: 83 U/L (ref 38–126)
Anion gap: 16 — ABNORMAL HIGH (ref 5–15)
BUN: 72 mg/dL — ABNORMAL HIGH (ref 8–23)
CO2: 30 mmol/L (ref 22–32)
Calcium: 9.2 mg/dL (ref 8.9–10.3)
Chloride: 87 mmol/L — ABNORMAL LOW (ref 98–111)
Creatinine, Ser: 2.68 mg/dL — ABNORMAL HIGH (ref 0.44–1.00)
GFR, Estimated: 17 mL/min — ABNORMAL LOW (ref >60)
Glucose, Bld: 190 mg/dL — ABNORMAL HIGH (ref 70–99)
Potassium: 2.9 mmol/L — ABNORMAL LOW (ref 3.5–5.1)
Sodium: 133 mmol/L — ABNORMAL LOW (ref 135–145)
Total Bilirubin: 0.5 mg/dL (ref 0.0–1.2)
Total Protein: 5.3 g/dL — ABNORMAL LOW (ref 6.5–8.1)

## 2024-04-16 LAB — GLUCOSE, CAPILLARY
Glucose-Capillary: 232 mg/dL — ABNORMAL HIGH (ref 70–99)
Glucose-Capillary: 262 mg/dL — ABNORMAL HIGH (ref 70–99)
Glucose-Capillary: 278 mg/dL — ABNORMAL HIGH (ref 70–99)
Glucose-Capillary: 332 mg/dL — ABNORMAL HIGH (ref 70–99)

## 2024-04-16 LAB — CBC
HCT: 31.9 % — ABNORMAL LOW (ref 36.0–46.0)
Hemoglobin: 10.5 g/dL — ABNORMAL LOW (ref 12.0–15.0)
MCH: 30.7 pg (ref 26.0–34.0)
MCHC: 32.9 g/dL (ref 30.0–36.0)
MCV: 93.3 fL (ref 80.0–100.0)
Platelets: 119 K/uL — ABNORMAL LOW (ref 150–400)
RBC: 3.42 MIL/uL — ABNORMAL LOW (ref 3.87–5.11)
RDW: 14.5 % (ref 11.5–15.5)
WBC: 5.2 K/uL (ref 4.0–10.5)
nRBC: 0 % (ref 0.0–0.2)

## 2024-04-16 LAB — BRAIN NATRIURETIC PEPTIDE: B Natriuretic Peptide: 2765 pg/mL — ABNORMAL HIGH (ref 0.0–100.0)

## 2024-04-16 MED ORDER — INSULIN ASPART 100 UNIT/ML IJ SOLN
0.0000 [IU] | Freq: Three times a day (TID) | INTRAMUSCULAR | Status: DC
  Administered 2024-04-16 – 2024-04-17 (×3): 5 [IU] via SUBCUTANEOUS
  Administered 2024-04-17: 3 [IU] via SUBCUTANEOUS
  Administered 2024-04-17: 2 [IU] via SUBCUTANEOUS
  Administered 2024-04-18: 5 [IU] via SUBCUTANEOUS
  Administered 2024-04-18: 2 [IU] via SUBCUTANEOUS
  Administered 2024-04-18: 5 [IU] via SUBCUTANEOUS
  Administered 2024-04-19: 7 [IU] via SUBCUTANEOUS
  Administered 2024-04-19: 5 [IU] via SUBCUTANEOUS
  Administered 2024-04-19: 2 [IU] via SUBCUTANEOUS
  Administered 2024-04-20 (×2): 9 [IU] via SUBCUTANEOUS
  Administered 2024-04-20 – 2024-04-21 (×3): 3 [IU] via SUBCUTANEOUS

## 2024-04-16 MED ORDER — POTASSIUM CHLORIDE CRYS ER 20 MEQ PO TBCR
60.0000 meq | EXTENDED_RELEASE_TABLET | Freq: Once | ORAL | Status: AC
  Administered 2024-04-16: 60 meq via ORAL
  Filled 2024-04-16: qty 3

## 2024-04-16 MED ORDER — ISOSORBIDE MONONITRATE ER 60 MG PO TB24
60.0000 mg | ORAL_TABLET | Freq: Every day | ORAL | Status: DC
  Administered 2024-04-16 – 2024-04-21 (×6): 60 mg via ORAL
  Filled 2024-04-16 (×6): qty 1

## 2024-04-16 MED ORDER — FUROSEMIDE 10 MG/ML IJ SOLN
120.0000 mg | Freq: Two times a day (BID) | INTRAVENOUS | Status: AC
  Administered 2024-04-16 (×2): 120 mg via INTRAVENOUS
  Filled 2024-04-16 (×2): qty 12

## 2024-04-16 MED ORDER — INSULIN ASPART 100 UNIT/ML IJ SOLN
2.0000 [IU] | Freq: Three times a day (TID) | INTRAMUSCULAR | Status: DC
  Administered 2024-04-16 – 2024-04-21 (×13): 2 [IU] via SUBCUTANEOUS

## 2024-04-16 NOTE — Plan of Care (Signed)
   Problem: Health Behavior/Discharge Planning: Goal: Ability to manage health-related needs will improve Outcome: Progressing

## 2024-04-16 NOTE — Progress Notes (Signed)
 Physical Therapy Treatment Patient Details Name: Emily Jennings MRN: [id number] DOB: 1934-02-18 Today's Date: 04/16/2024   History of Present Illness Emily Jennings is a 89 y.o. female with medical history significant for CHF, diabetes mellitus, hypertension, CKD 4.  Patient presented to the ED with complaints of difficulty breathing over the past 2 days, swelling to bilateral lower extremities up to her thighs, swelling of her arms.  She reports weight gain from 128-135 today.  She reports compliance with her medication-torsemide  60/40mg . Torsemide  dose was increased 7/15. Recent hospitalizations for same-most recent 6/23 to 6/25.    PT Comments  Patient agreeable to PT session, but appeared more restless and disengaged than with previous sessions. Patient tolerated LE there ex well but required greater assistance with bed mobility and ambulation secondary to BIL LE weakness and fatigue. She was limited to a few steps forward and backward at bedside and demonstrates poor dynamic balance with heavy reliance on RW. Blood pressure reading 188/82- reported to nursing. Patient will benefit from continued skilled physical therapy in hospital and recommended venue below to increase strength, balance, endurance for safe ADLs and gait.     If plan is discharge home, recommend the following: Help with stairs or ramp for entrance;Assist for transportation;Assistance with cooking/housework;A lot of help with bathing/dressing/bathroom;A lot of help with walking and/or transfers   Can travel by private vehicle     Yes  Equipment Recommendations  None recommended by PT    Recommendations for Other Services       Precautions / Restrictions Precautions Precautions: Fall Recall of Precautions/Restrictions: Intact Restrictions Weight Bearing Restrictions Per Provider Order: No     Mobility  Bed Mobility Overal bed mobility: Needs Assistance Bed Mobility: Supine to Sit     Supine to sit: HOB elevated,  Used rails, Min assist, Mod assist     General bed mobility comments: ModA required to move BIL LE and scoot EOB, Min A required to pull to sit    Transfers Overall transfer level: Needs assistance Equipment used: Rolling walker (2 wheels) Transfers: Sit to/from Stand, Bed to chair/wheelchair/BSC Sit to Stand: Min assist   Step pivot transfers: Min assist       General transfer comment: labored movement and increased effort to perform sit to stand; unsteady on feet using RW    Ambulation/Gait Ambulation/Gait assistance: Contact guard assist, Min assist Gait Distance (Feet): 5 Feet Assistive device: Rolling walker (2 wheels) Gait Pattern/deviations: Decreased step length - right, Decreased step length - left, Trunk flexed, Narrow base of support, Decreased stride length Gait velocity: gait     General Gait Details: labored movement, increased effort, limited to fatigue and BIL LE weakness,   Stairs             Wheelchair Mobility     Tilt Bed    Modified Rankin (Stroke Patients Only)       Balance Overall balance assessment: Needs assistance Sitting-balance support: Feet supported, No upper extremity supported Sitting balance-Leahy Scale: Good Sitting balance - Comments: seated at EOB   Standing balance support: Bilateral upper extremity supported, Reliant on assistive device for balance, During functional activity Standing balance-Leahy Scale: Poor Standing balance comment: poor using RW                            Communication Communication Communication: No apparent difficulties  Cognition Arousal: Alert Behavior During Therapy: Restless   PT - Cognitive impairments: No apparent  impairments                       PT - Cognition Comments: Inconsistently following commands, disinterest Following commands: Impaired Following commands impaired: Follows one step commands inconsistently    Cueing Cueing Techniques: Verbal cues,  Tactile cues  Exercises General Exercises - Lower Extremity Long Arc Quad: AROM, Strengthening, Both, 10 reps, Seated Hip Flexion/Marching: Seated, AROM, Strengthening, Both, 10 reps Toe Raises: Seated, AROM, Strengthening, Both, 10 reps Heel Raises: Seated, AROM, Strengthening, Both, 10 reps    General Comments        Pertinent Vitals/Pain Pain Assessment Pain Assessment: No/denies pain    Home Living                          Prior Function            PT Goals (current goals can now be found in the care plan section) Acute Rehab PT Goals Patient Stated Goal: return home PT Goal Formulation: With patient Time For Goal Achievement: 04/23/24 Potential to Achieve Goals: Good Progress towards PT goals: Progressing toward goals    Frequency    Min 3X/week      PT Plan      Co-evaluation              AM-PAC PT 6 Clicks Mobility   Outcome Measure  Help needed turning from your back to your side while in a flat bed without using bedrails?: A Little Help needed moving from lying on your back to sitting on the side of a flat bed without using bedrails?: A Little Help needed moving to and from a bed to a chair (including a wheelchair)?: A Lot Help needed standing up from a chair using your arms (e.g., wheelchair or bedside chair)?: A Lot Help needed to walk in hospital room?: A Little Help needed climbing 3-5 steps with a railing? : A Lot 6 Click Score: 15    End of Session Equipment Utilized During Treatment: Gait belt Activity Tolerance: Patient limited by fatigue;Other (comment) Patient left: in chair;with call bell/phone within reach;with chair alarm set Nurse Communication: Mobility status (RN notified of BP taken during session) PT Visit Diagnosis: Unsteadiness on feet (R26.81);Other abnormalities of gait and mobility (R26.89);Muscle weakness (generalized) (M62.81)     Time: 8497-8477 PT Time Calculation (min) (ACUTE ONLY): 20  min  Charges:    $Therapeutic Exercise: 8-22 mins $Therapeutic Activity: 8-22 mins PT General Charges $$ ACUTE PT VISIT: 1 Visit                     3:43 PM, 04/16/24,  Onnie Como, SPT

## 2024-04-16 NOTE — Inpatient Diabetes Management (Signed)
 Inpatient Diabetes Program Recommendations  AACE/ADA: New Consensus Statement on Inpatient Glycemic Control (2015)  Target Ranges:  Prepandial:   less than 140 mg/dL      Peak postprandial:   less than 180 mg/dL (1-2 hours)      Critically ill patients:  140 - 180 mg/dL    Latest Reference Range & Units 04/15/24 07:21 04/15/24 07:30 04/15/24 08:27 04/15/24 09:04 04/15/24 11:27 04/15/24 16:37 04/15/24 21:19 04/16/24 07:31  Glucose-Capillary 70 - 99 mg/dL 35 (LL) 42 (LL) 49 (L) 99 131 (H) 102 (H) 251 (H) 232 (H)   Review of Glycemic Control  Diabetes history: DM2 Outpatient Diabetes medications: Tresiba  6 units at bedtime, Humalog  0-12 units TID with meals Current orders for Inpatient glycemic control: Novolog  0-5 units QHS   Inpatient Diabetes Program Recommendations:     Insulin : CBG down to 35 mg/dl yesterday and Semglee  was discontinued.  Please consider ordering Novolog  0-6 units TID with meals.   Thanks, Earnie Gainer, RN, MSN, CDCES Diabetes Coordinator Inpatient Diabetes Program [phone] (Team Pager from 8am to 5pm)

## 2024-04-16 NOTE — Progress Notes (Signed)
 [facility] Progress Note    Assessment/ Plan:   AKI on CKD4-5 -AKI likely secondary to cardiorenal. Cr currently not far off as compared to baseline, stable at 2.68 today. C/w diuresis -poor dialysis candidate (age, functional status). Followed by Dr. Rachele as an outpatient. Palliative care has seen--patient confirms that she does NOT want dialysis. -Avoid nephrotoxic medications including NSAIDs and iodinated intravenous contrast exposure unless the latter is absolutely indicated.  Preferred narcotic agents for pain control are hydromorphone, fentanyl , and methadone. Morphine  should not be used. Avoid Baclofen and avoid oral sodium phosphate  and magnesium  citrate based laxatives / bowel preps. Continue strict Input and Output monitoring. Will monitor the patient closely with you and intervene or adjust therapy as indicated by changes in clinical status/labs   Encephalopathy -confused this AM, this is different as compared to the last 2 days I have seen her. Recommend possible MRI to evaluate for PRES vs CVA? Discussed with primary  Acute on chronic HFpEF -given that she is still volume up will start her on lasix  120mg  BID x 1 day and then reassess  Hypertensive Crisis Hypotension-resolved Bradycardia-resolved -on cardizem  30mg  every 6 hours, hydralazine  100mg  q8h -adding back imdur  60mg  daily -if needed, can consider adding back clonidine --can consider patch -lasix  as above   Anemia of CKD -transfuse PRN for Hgb <7- s/p 1u prbc 7/28, hgb up to 10.5 -c/w aranesp  60 mcg qweekly  Hypervolemic Hyponatremia -Fluid restrict ~1.2L/day -na up to 133 today. Diuresis as above  Acidosis -stopped nahco3 due to contraction alkalosis, monitor for now, bicarb acceptable  DM2 -per primary  Hypokalemia -replete PRN, to receive kcl 40meq x 1 today  DNR.  Discussed with primary service.  Ephriam Stank, MD [facility]   Subjective:   Patient seen and  examined bedside.  Remains hypertensive, trend improved however. UOP charted:only 0.5L Patient very slow to respond and sometimes. Not answering some questions.   Objective:   BP (!) 229/77 (BP Location: Left Arm)   Pulse 76   Temp 98.6 F (37 C) (Oral)   Resp 15   Ht 5' (1.524 m)   Wt 56.5 kg   SpO2 95%   BMI 24.33 kg/m   Intake/Output Summary (Last 24 hours) at 04/16/2024 0813 Last data filed at 04/16/2024 0446 Gross per 24 hour  Intake 720 ml  Output 500 ml  Net 220 ml   Weight change: -3.3 kg  Physical Exam: Gen: ill appearing, NAD, laying flat in bed CVS: RRR Resp: diminished air entry bibasilar, on RA Abd: soft Ext: 1+ pitting edema B/L Les Neuro: confused  Imaging: No results found.  Labs: BMET Recent Labs  Lab 04/10/24 0820 04/11/24 0422 04/12/24 0416 04/13/24 0429 04/14/24 0414 04/15/24 0458 04/16/24 0418  NA 126* 126* 129* 133* 129* 132* 133*  K 3.5 3.2* 2.8* 2.5* 3.9 3.0* 2.9*  CL 88* 88* 86* 86* 86* 85* 87*  CO2 28 30 31  34* 33* 36* 30  GLUCOSE 89 188* 104* 51* 141* 90 190*  BUN 59* 61* 60* 60* 64* 68* 72*  CREATININE 2.63* 2.53* 2.59* 2.60* 2.83* 2.60* 2.68*  CALCIUM  8.4* 8.5* 8.9 8.9 8.8* 9.2 9.2   CBC Recent Labs  Lab 04/13/24 0429 04/14/24 0414 04/15/24 0458 04/16/24 0418  WBC 4.3 4.0 5.1 5.2  HGB 7.8* 7.5* 9.9* 10.5*  HCT 23.5* 23.0* 29.8* 31.9*  MCV 93.3 95.4 92.0 93.3  PLT 140* 139* 120* 119*    Medications:     Chlorhexidine  Gluconate Cloth  6 each Topical Daily   darbepoetin (ARANESP ) injection - DIALYSIS  60 mcg Subcutaneous Q Wed-1800   diltiazem   60 mg Oral Q6H   feeding supplement  237 mL Oral BID BM   heparin  injection (subcutaneous)  5,000 Units Subcutaneous Q8H   hydrALAZINE   100 mg Oral Q8H   insulin  aspart  0-5 Units Subcutaneous QHS   isosorbide  mononitrate  60 mg Oral Daily      Ephriam Stank, MD [facility] 04/16/2024, 8:13 AM

## 2024-04-16 NOTE — Plan of Care (Signed)
   Problem: Education: Goal: Knowledge of General Education information will improve Description Including pain rating scale, medication(s)/side effects and non-pharmacologic comfort measures Outcome: Progressing

## 2024-04-16 NOTE — Hospital Course (Signed)
 89 y.o. female with medical history significant for CHF, diabetes mellitus, hypertension, CKD 4.  Patient presented to the ED with complaints of difficulty breathing over the past 2 days, swelling to bilateral lower extremities up to her thighs, swelling of her arms.  She reports weight gain from 128-135 today.  She reports compliance with her medication-torsemide  60/40mg . Torsemide  dose was increased 7/15.   Recent hospitalizations for same-most recent 6/23 to 6/25.   ED Course: Temperature 92.8.  Heart rate 56-64.  Respirate rate 11-25.  Blood pressure systolic 143-174.  O2 sat greater than 95% on room air.  BNP 962.  Troponin 6 > 7.  Chest x-ray small bilateral pleural effusions.  Lasix  60 mg x 1 given

## 2024-04-16 NOTE — Progress Notes (Signed)
 PROGRESS NOTE   Emily Jennings  FMW:[id number] DOB: Sep 27, 1933 DOA: 04/07/2024 PCP: Antonetta Rollene BRAVO, MD   Chief Complaint  Patient presents with   Shortness of Breath   Level of care: Telemetry  Brief Admission History:  89 y.o. female with medical history significant for CHF, diabetes mellitus, hypertension, CKD 4.  Patient presented to the ED with complaints of difficulty breathing over the past 2 days, swelling to bilateral lower extremities up to her thighs, swelling of her arms.  She reports weight gain from 128-135 today.  She reports compliance with her medication-torsemide  60/40mg . Torsemide  dose was increased 7/15.   Recent hospitalizations for same-most recent 6/23 to 6/25.   ED Course: Temperature 92.8.  Heart rate 56-64.  Respirate rate 11-25.  Blood pressure systolic 143-174.  O2 sat greater than 95% on room air.  BNP 962.  Troponin 6 > 7.  Chest x-ray small bilateral pleural effusions.  Lasix  60 mg x 1 given   Assessment and Plan:  Decompensated  bradycardia  and hypotension -on 04/13/2024 around 1830 - Patient's heart rate was around 40, blood pressure 77/38 - Patient was given atropine  x 1, midodrine  10 mg p.o. x 1, 500 mL of bolus saline - Was transferred to ICU overnight for close observation   -Patient received diltiazem  90 mg x 1 on 04/13/2024 - Current medication of hydralazine /isosorbide , labetalol  and clonidine  and torsemide  was discontinued   Cardiology and nephrology following the patient closely: Restarted patient on:  Coreg  3.125 mg p.o. twice daily,  hydralazine  50 mg 3 times daily  Lasix  40 mg IV twice daily    Acute HFpEF  Question of cardiorenal syndrome - Progressive heart failure-  - Developing progressive cardiorenal syndrome -Cardiology nephrology consulted   -During this admission patient was treated aggressively with diuretics  Lasix  120 mg every 8 hours Till 04/12/24  switched to Demadex  40 mg twice daily on 7/27 Back to IV Lasix  40 mg  twice daily started 04/14/2024, increased to 120 mg BID by nephrologist on 7/30   Filed Weights   04/14/24 1445 04/15/24 0500 04/16/24 0436  Weight: 59.8 kg 59 kg 56.5 kg    Intake/Output Summary (Last 24 hours) at 04/16/2024 1318 Last data filed at 04/16/2024 0903 Gross per 24 hour  Intake 310 ml  Output 700 ml  Net -390 ml    -Following daily weights, strict I's and O's, Reds clip measurements and low-sodium diet. - Recent 2D echo demonstrating ejection fraction 70 to 75% with grade 2 diastolic dysfunction (no need to be repeated).   - Improved anasarca, improved orthopnea   Hypertensive crisis -Persistent/resistant hypertension Was on clonidine /hydralazine /labetalol /Cardizem -was discontinued 04/13/2024 - Restarting Coreg , hydralazine , IV Lasix    Acute Encephalopathy - agree with nephrologist, ordered MRI brain to rule out PRES, CVA   Type 2 diabetes mellitus with nephropathy - A1c 7.4 - CBG q. ACHS, SSI coverage - discontinued semglee  due to severe hypoglycemia - added renal dose prandial coverage novolog  (2 units TID with meals eaten>50%)  CBG (last 3)  Recent Labs    04/15/24 2119 04/16/24 0731 04/16/24 1058  GLUCAP 251* 232* 262*   Chronic kidney disease stage IV - Monitoring closely -Nephrology following-no mass dialysis -Continue to follow renal function trend stability especially with ongoing IV diuresis. - Follow electrolytes and replete them as needed. Recent Labs       Lab Results  Component Value Date    CREATININE 2.83 (H) 04/14/2024    CREATININE 2.60 (H) 04/13/2024  CREATININE 2.59 (H) 04/12/2024    Nephrology -appreciate follow-up and recommendations    Hyponatremia -Much improved - Continue treatment with diuretics - Follow electrolytes trend - Patient asymptomatic. - Serum sodium level: 123, 123, 125, 126 t --->  129 - Fluid restriction 1200 mL per day   Hypokalemia/hypomagnesemia K 3.2, 2.8, 2.5,>>> 3.9  -Mag 2.2 -Monitoring and  repleting   Anemia of chronic disease due to chronic kidney disease -Monitoring H&H closely - Follow-up with iron  studies - Nephrology added Aranesp  60 mg q. Weekly     Latest Ref Rng & Units 04/14/2024    4:14 AM 04/13/2024    4:29 AM 04/12/2024    4:16 AM  CBC  WBC 4.0 - 10.5 K/uL 4.0  4.3  4.1   Hemoglobin 12.0 - 15.0 g/dL 7.5  7.8  8.2   Hematocrit 36.0 - 46.0 % 23.0  23.5  23.7   Platelets 150 - 400 K/uL 139  140  140     - Due to cardiorenal syndrome, hypotension - 04/14/2024 transfusing 1U PRBC   DVT prophylaxis: sq  heparin  Code Status: DNR  Family Communication:  Disposition: TBD    Consultants:  Nephrology Cardiology - signed off  Procedures:   Antimicrobials:    Subjective: Pt was difficult to arouse this morning and confused.   Objective: Vitals:   04/16/24 0100 04/16/24 0425 04/16/24 0436 04/16/24 0817  BP: (!) 216/67 (!) 229/77  (!) 213/69  Pulse: 66 76  75  Resp:    17  Temp: 99.6 F (37.6 C) 98.6 F (37 C)  98.6 F (37 C)  TempSrc: Oral Oral  Oral  SpO2: 96% 95%  98%  Weight:   56.5 kg   Height:        Intake/Output Summary (Last 24 hours) at 04/16/2024 1314 Last data filed at 04/16/2024 0903 Gross per 24 hour  Intake 310 ml  Output 700 ml  Net -390 ml   Filed Weights   04/14/24 1445 04/15/24 0500 04/16/24 0436  Weight: 59.8 kg 59 kg 56.5 kg   Examination:  General exam: Appeared somnolent and confused.   Respiratory system: Clear to auscultation. Respiratory effort normal. Cardiovascular system: normal S1 & S2 heard. No JVD, murmurs, rubs, gallops or clicks. No pedal edema. Gastrointestinal system: Abdomen is nondistended, soft and nontender. No organomegaly or masses felt. Normal bowel sounds heard. Central nervous system: somnolent and disoriented. No focal neurological deficits. Extremities: Symmetric 5 x 5 power. Skin: No rashes, lesions or ulcers. Psychiatry: Judgement and insight appear diminished. Mood & affect flat.   Data  Reviewed: I have personally reviewed following labs and imaging studies  CBC: Recent Labs  Lab 04/12/24 0416 04/13/24 0429 04/14/24 0414 04/15/24 0458 04/16/24 0418  WBC 4.1 4.3 4.0 5.1 5.2  HGB 8.2* 7.8* 7.5* 9.9* 10.5*  HCT 23.7* 23.5* 23.0* 29.8* 31.9*  MCV 92.9 93.3 95.4 92.0 93.3  PLT 140* 140* 139* 120* 119*    Basic Metabolic Panel: Recent Labs  Lab 04/12/24 0416 04/13/24 0429 04/14/24 0414 04/15/24 0458 04/16/24 0418  NA 129* 133* 129* 132* 133*  K 2.8* 2.5* 3.9 3.0* 2.9*  CL 86* 86* 86* 85* 87*  CO2 31 34* 33* 36* 30  GLUCOSE 104* 51* 141* 90 190*  BUN 60* 60* 64* 68* 72*  CREATININE 2.59* 2.60* 2.83* 2.60* 2.68*  CALCIUM  8.9 8.9 8.8* 9.2 9.2  MG 2.2  --   --   --   --     CBG:  Recent Labs  Lab 04/15/24 1127 04/15/24 1637 04/15/24 2119 04/16/24 0731 04/16/24 1058  GLUCAP 131* 102* 251* 232* 262*    Recent Results (from the past 240 hours)  MRSA Next Gen by PCR, Nasal     Status: None   Collection Time: 04/07/24  5:48 PM   Specimen: Nasal Mucosa; Nasal Swab  Result Value Ref Range Status   MRSA by PCR Next Gen NOT DETECTED NOT DETECTED Final    Comment: (NOTE) The GeneXpert MRSA Assay (FDA approved for NASAL specimens only), is one component of a comprehensive MRSA colonization surveillance program. It is not intended to diagnose MRSA infection nor to guide or monitor treatment for MRSA infections. Test performance is not FDA approved in patients less than 59 years old. Performed at [facility], [address], [place], [place] [address]      Radiology Studies: No results found.  Scheduled Meds:  Chlorhexidine  Gluconate Cloth  6 each Topical Daily   darbepoetin (ARANESP ) injection - DIALYSIS  60 mcg Subcutaneous Q Wed-1800   diltiazem   60 mg Oral Q6H   feeding supplement  237 mL Oral BID BM   heparin  injection (subcutaneous)  5,000 Units Subcutaneous Q8H   hydrALAZINE   100 mg Oral Q8H   insulin  aspart  0-9 Units Subcutaneous TID WC    insulin  aspart  2 Units Subcutaneous TID WC   isosorbide  mononitrate  60 mg Oral Daily   Continuous Infusions:  furosemide  120 mg (04/16/24 1031)     LOS: 9 days   Time spent: 55 mins  Emmalynne Courtney Vicci, MD How to contact the [facility] Attending or Consulting provider 7A - 7P or covering provider during after hours 7P -7A, for this patient?  Check the care team in [facility] and look for a) attending/consulting TRH provider listed and b) the TRH team listed Log into [email or web address] to find provider on call.  Locate the TRH provider you are looking for under [facility] and page to a number that you can be directly reached. If you still have difficulty reaching the provider, please page the [facility] (Director on Call) for the Hospitalists listed on amion for assistance.  04/16/2024, 1:14 PM

## 2024-04-17 DIAGNOSIS — E871 Hypo-osmolality and hyponatremia: Secondary | ICD-10-CM | POA: Diagnosis not present

## 2024-04-17 DIAGNOSIS — I5033 Acute on chronic diastolic (congestive) heart failure: Secondary | ICD-10-CM | POA: Diagnosis not present

## 2024-04-17 DIAGNOSIS — E1322 Other specified diabetes mellitus with diabetic chronic kidney disease: Secondary | ICD-10-CM | POA: Diagnosis not present

## 2024-04-17 DIAGNOSIS — Z66 Do not resuscitate: Secondary | ICD-10-CM | POA: Diagnosis not present

## 2024-04-17 LAB — COMPREHENSIVE METABOLIC PANEL WITH GFR
ALT: 21 U/L (ref 0–44)
AST: 25 U/L (ref 15–41)
Albumin: 3 g/dL — ABNORMAL LOW (ref 3.5–5.0)
Alkaline Phosphatase: 79 U/L (ref 38–126)
Anion gap: 12 (ref 5–15)
BUN: 80 mg/dL — ABNORMAL HIGH (ref 8–23)
CO2: 30 mmol/L (ref 22–32)
Calcium: 8.8 mg/dL — ABNORMAL LOW (ref 8.9–10.3)
Chloride: 90 mmol/L — ABNORMAL LOW (ref 98–111)
Creatinine, Ser: 2.94 mg/dL — ABNORMAL HIGH (ref 0.44–1.00)
GFR, Estimated: 15 mL/min — ABNORMAL LOW (ref >60)
Glucose, Bld: 261 mg/dL — ABNORMAL HIGH (ref 70–99)
Potassium: 3.1 mmol/L — ABNORMAL LOW (ref 3.5–5.1)
Sodium: 132 mmol/L — ABNORMAL LOW (ref 135–145)
Total Bilirubin: 1 mg/dL (ref 0.0–1.2)
Total Protein: 5.2 g/dL — ABNORMAL LOW (ref 6.5–8.1)

## 2024-04-17 LAB — GLUCOSE, CAPILLARY
Glucose-Capillary: 291 mg/dL — ABNORMAL HIGH (ref 70–99)
Glucose-Capillary: 257 mg/dL — ABNORMAL HIGH (ref 70–99)
Glucose-Capillary: 241 mg/dL — ABNORMAL HIGH (ref 70–99)
Glucose-Capillary: 165 mg/dL — ABNORMAL HIGH (ref 70–99)
Glucose-Capillary: 143 mg/dL — ABNORMAL HIGH (ref 70–99)

## 2024-04-17 LAB — CBC
HCT: 28.3 % — ABNORMAL LOW (ref 36.0–46.0)
Hemoglobin: 9.6 g/dL — ABNORMAL LOW (ref 12.0–15.0)
MCH: 31.8 pg (ref 26.0–34.0)
MCHC: 33.9 g/dL (ref 30.0–36.0)
MCV: 93.7 fL (ref 80.0–100.0)
Platelets: 116 K/uL — ABNORMAL LOW (ref 150–400)
RBC: 3.02 MIL/uL — ABNORMAL LOW (ref 3.87–5.11)
RDW: 14.6 % (ref 11.5–15.5)
WBC: 5.5 K/uL (ref 4.0–10.5)
nRBC: 0 % (ref 0.0–0.2)

## 2024-04-17 LAB — BRAIN NATRIURETIC PEPTIDE: B Natriuretic Peptide: 2269 pg/mL — ABNORMAL HIGH (ref 0.0–100.0)

## 2024-04-17 MED ORDER — INSULIN GLARGINE-YFGN 100 UNIT/ML ~~LOC~~ SOLN
3.0000 [IU] | Freq: Every day | SUBCUTANEOUS | Status: DC
  Administered 2024-04-17 – 2024-04-18 (×2): 3 [IU] via SUBCUTANEOUS
  Filled 2024-04-17 (×3): qty 0.03

## 2024-04-17 MED ORDER — TORSEMIDE 20 MG PO TABS
40.0000 mg | ORAL_TABLET | Freq: Every day | ORAL | Status: DC
  Administered 2024-04-17 – 2024-04-21 (×5): 40 mg via ORAL
  Filled 2024-04-17 (×5): qty 2

## 2024-04-17 MED ORDER — POTASSIUM CHLORIDE CRYS ER 20 MEQ PO TBCR
40.0000 meq | EXTENDED_RELEASE_TABLET | Freq: Two times a day (BID) | ORAL | Status: AC
  Administered 2024-04-17 – 2024-04-18 (×3): 40 meq via ORAL
  Filled 2024-04-17 (×3): qty 2

## 2024-04-17 MED ORDER — CLONIDINE HCL 0.1 MG PO TABS
0.1000 mg | ORAL_TABLET | Freq: Three times a day (TID) | ORAL | Status: DC
  Administered 2024-04-17 (×3): 0.1 mg via ORAL
  Filled 2024-04-17 (×4): qty 1

## 2024-04-17 NOTE — Progress Notes (Signed)
 PROGRESS NOTE   Emily Jennings  FMW:[id number] DOB: 01-13-1934 DOA: 04/07/2024 PCP: Antonetta Rollene BRAVO, MD   Chief Complaint  Patient presents with   Shortness of Breath   Level of care: Telemetry  Brief Admission History:  89 y.o. female with medical history significant for CHF, diabetes mellitus, hypertension, CKD 4.  Patient presented to the ED with complaints of difficulty breathing over the past 2 days, swelling to bilateral lower extremities up to her thighs, swelling of her arms.  She reports weight gain from 128-135 today.  She reports compliance with her medication-torsemide  60/40mg . Torsemide  dose was increased 7/15.   Recent hospitalizations for same-most recent 6/23 to 6/25.   ED Course: Temperature 92.8.  Heart rate 56-64.  Respirate rate 11-25.  Blood pressure systolic 143-174.  O2 sat greater than 95% on room air.  BNP 962.  Troponin 6 > 7.  Chest x-ray small bilateral pleural effusions.  Lasix  60 mg x 1 given   Assessment and Plan:  Decompensated bradycardia and hypotension -on 04/13/2024 around 1830 - Patient's heart rate was around 40, blood pressure 77/38 - RESOLVED  - Patient was given atropine  x 1, midodrine  10 mg p.o. x 1, 500 mL of bolus saline - Was transferred to ICU overnight for close observation, now she is back on telemetry unit   -Patient received diltiazem  90 mg x 1 on 04/13/2024 - Current medication of hydralazine /isosorbide , labetalol  and clonidine  and torsemide  was discontinued    Acute HFpEF  Question of cardiorenal syndrome - Progressive heart failure-  - Developing progressive cardiorenal syndrome - Cardiology and nephrology consulted, cardio signed off   -During this admission patient has been treated aggressively with diuretics  Lasix  120 mg every 8 hours Till 04/12/24  switched to Demadex  40 mg twice daily on 7/27 Back to IV Lasix  40 mg twice daily started 04/14/2024, increased to 120 mg BID by nephrologist on  7/31: nephrology team starting  torsemide  40 mg daily    Filed Weights   04/15/24 0500 04/16/24 0436 04/17/24 0419  Weight: 59 kg 56.5 kg 52 kg    Intake/Output Summary (Last 24 hours) at 04/17/2024 1229 Last data filed at 04/17/2024 0900 Gross per 24 hour  Intake 872 ml  Output 925 ml  Net -53 ml    -Following daily weights, strict I's and O's, Reds clip measurements and low-sodium diet. - Recent 2D echo demonstrating ejection fraction 70 to 75% with grade 2 diastolic dysfunction (no need to be repeated).   - Improved anasarca, improved orthopnea   Hypertensive crisis -Persistent/resistant hypertension Was on clonidine /hydralazine /labetalol /Cardizem -was discontinued 04/13/2024 - now she is on hydralazine , diltiazem , imdur , torsemide , clonidine    Acute Encephalopathy - agree with nephrologist, ordered MRI brain to rule out PRES, CVA - no acute findings   Type 2 diabetes mellitus with nephropathy - A1c 7.4 - CBG q. ACHS, SSI coverage - restarted semglee  3 units daily with plan to slowly titrate doses  - added renal dose prandial coverage novolog  (2 units TID with meals eaten>50%)  CBG (last 3)  Recent Labs    04/17/24 0258 04/17/24 0820 04/17/24 1120  GLUCAP 291* 257* 241*   Chronic kidney disease stage IV - Monitoring closely -Nephrology following-no mass dialysis -Continue to follow renal function trend stability especially with ongoing IV diuresis. - Follow electrolytes and replete them as needed. Recent Labs       Lab Results  Component Value Date    CREATININE 2.83 (H) 04/14/2024    CREATININE 2.60 (H)  04/13/2024    CREATININE 2.59 (H) 04/12/2024    Nephrology -appreciate follow-up and recommendations    Hyponatremia - Much improved - Continue treatment with diuretics - Follow electrolytes trend - Patient asymptomatic. - Serum sodium level: 123, 123, 125, 126>>132 - Fluid restriction 1200 mL per day   Hypokalemia/hypomagnesemia K 3.2, 2.8, 2.5,>>> 3.9  -Mag 2.2 -Monitoring and  repleting   Anemia of chronic disease due to chronic kidney disease -Monitoring H&H closely - Follow-up with iron  studies - Nephrology added Aranesp  60 mg q. Weekly     Latest Ref Rng & Units 04/14/2024    4:14 AM 04/13/2024    4:29 AM 04/12/2024    4:16 AM  CBC  WBC 4.0 - 10.5 K/uL 4.0  4.3  4.1   Hemoglobin 12.0 - 15.0 g/dL 7.5  7.8  8.2   Hematocrit 36.0 - 46.0 % 23.0  23.5  23.7   Platelets 150 - 400 K/uL 139  140  140     - Due to cardiorenal syndrome, hypotension - 04/14/2024 transfusing 1U PRBC   DVT prophylaxis: sq  heparin  Code Status: DNR  Family Communication:  Disposition: anticipating home     Consultants:  Nephrology Cardiology - signed off  Procedures:   Antimicrobials:    Subjective: Pt remains extremely frail, not eating much; less confused today. More interactive, no specific complaints.   Objective: Vitals:   04/17/24 0824 04/17/24 1154 04/17/24 1155 04/17/24 1211  BP: (!) 196/74 (!) 221/80 (!) 221/80 (!) 221/80  Pulse: 77 80    Resp: 20 (!) 24    Temp: 98.6 F (37 C) 98.6 F (37 C)    TempSrc: Oral Oral    SpO2: 98% 98%    Weight:      Height:        Intake/Output Summary (Last 24 hours) at 04/17/2024 1229 Last data filed at 04/17/2024 0900 Gross per 24 hour  Intake 872 ml  Output 925 ml  Net -53 ml   Filed Weights   04/15/24 0500 04/16/24 0436 04/17/24 0419  Weight: 59 kg 56.5 kg 52 kg   Examination:  General exam: frail, elderly female, severely deconditioned. Being fed breakfast.   Respiratory system: Clear to auscultation. Respiratory effort normal. Cardiovascular system: normal S1 & S2 heard. No JVD, murmurs, rubs, gallops or clicks. No pedal edema. Gastrointestinal system: Abdomen is nondistended, soft and nontender. No organomegaly or masses felt. Normal bowel sounds heard. Central nervous system: somnolent and disoriented. No focal neurological deficits. Extremities: persistent peripheral edema seen. Symmetric 5 x 5  power. Skin: No rashes, lesions or ulcers. Psychiatry: Judgement and insight appear diminished. Mood & affect flat.   Data Reviewed: I have personally reviewed following labs and imaging studies  CBC: Recent Labs  Lab 04/13/24 0429 04/14/24 0414 04/15/24 0458 04/16/24 0418 04/17/24 0433  WBC 4.3 4.0 5.1 5.2 5.5  HGB 7.8* 7.5* 9.9* 10.5* 9.6*  HCT 23.5* 23.0* 29.8* 31.9* 28.3*  MCV 93.3 95.4 92.0 93.3 93.7  PLT 140* 139* 120* 119* 116*    Basic Metabolic Panel: Recent Labs  Lab 04/12/24 0416 04/13/24 0429 04/14/24 0414 04/15/24 0458 04/16/24 0418 04/17/24 0433  NA 129* 133* 129* 132* 133* 132*  K 2.8* 2.5* 3.9 3.0* 2.9* 3.1*  CL 86* 86* 86* 85* 87* 90*  CO2 31 34* 33* 36* 30 30  GLUCOSE 104* 51* 141* 90 190* 261*  BUN 60* 60* 64* 68* 72* 80*  CREATININE 2.59* 2.60* 2.83* 2.60* 2.68* 2.94*  CALCIUM   8.9 8.9 8.8* 9.2 9.2 8.8*  MG 2.2  --   --   --   --   --     CBG: Recent Labs  Lab 04/16/24 1630 04/16/24 2023 04/17/24 0258 04/17/24 0820 04/17/24 1120  GLUCAP 278* 332* 291* 257* 241*    Recent Results (from the past 240 hours)  MRSA Next Gen by PCR, Nasal     Status: None   Collection Time: 04/07/24  5:48 PM   Specimen: Nasal Mucosa; Nasal Swab  Result Value Ref Range Status   MRSA by PCR Next Gen NOT DETECTED NOT DETECTED Final    Comment: (NOTE) The GeneXpert MRSA Assay (FDA approved for NASAL specimens only), is one component of a comprehensive MRSA colonization surveillance program. It is not intended to diagnose MRSA infection nor to guide or monitor treatment for MRSA infections. Test performance is not FDA approved in patients less than 33 years old. Performed at [facility] [address] [place], [place] [address]      Radiology Studies: MR BRAIN WO CONTRAST Result Date: 04/16/2024 CLINICAL DATA:  Mental status change, unknown cause EXAM: MRI HEAD WITHOUT CONTRAST TECHNIQUE: Multiplanar, multiecho pulse sequences of the brain and  surrounding structures were obtained without intravenous contrast. COMPARISON:  CT head 05/20/2023. FINDINGS: Brain: Hemosiderin and mild T2 hypointensity in the right cerebellum without edema or mass effect, compatible with the sequela of known prior hemorrhage. No evidence of acute hemorrhage, acute infarct, midline shift or hydrocephalus. Patchy T2/FLAIR hyperintensities the white matter, compatible with chronic microvascular ischemic disease. Cerebral atrophy. Vascular: Major arterial flow voids are maintained at the skull base. Skull and upper cervical spine: Normal marrow signal. Sinuses/Orbits: Partially opacified right sphenoid sinus. No acute orbital findings. Other: No mastoid effusions. IMPRESSION: 1. No evidence of acute intracranial abnormality. 2. Sequela of prior right cerebellar hemorrhage. 3. Chronic microvascular ischemic disease and cerebral atrophy (ICD10-.9). Electronically Signed   By: Gilmore GORMAN Molt M.D.   On: 04/16/2024 22:47    Scheduled Meds:  Chlorhexidine  Gluconate Cloth  6 each Topical Daily   cloNIDine   0.1 mg Oral TID   darbepoetin (ARANESP ) injection - DIALYSIS  60 mcg Subcutaneous Q Wed-1800   diltiazem   60 mg Oral Q6H   feeding supplement  237 mL Oral BID BM   heparin  injection (subcutaneous)  5,000 Units Subcutaneous Q8H   hydrALAZINE   100 mg Oral Q8H   insulin  aspart  0-9 Units Subcutaneous TID WC   insulin  aspart  2 Units Subcutaneous TID WC   insulin  glargine-yfgn  3 Units Subcutaneous Daily   isosorbide  mononitrate  60 mg Oral Daily   potassium chloride   40 mEq Oral BID   torsemide   40 mg Oral Daily   Continuous Infusions:   LOS: 10 days   Time spent: 55 mins  Jordon Kristiansen Vicci, MD How to contact the [facility] Attending or Consulting provider 7A - 7P or covering provider during after hours 7P -7A, for this patient?  Check the care team in [facility] and look for a) attending/consulting TRH provider listed and b) the TRH team listed Log into [email or web address] to find  provider on call.  Locate the TRH provider you are looking for under [facility] and page to a number that you can be directly reached. If you still have difficulty reaching the provider, please page the [facility] (Director on Call) for the Hospitalists listed on amion for assistance.  04/17/2024, 12:29 PM

## 2024-04-17 NOTE — Progress Notes (Signed)
 [facility] NEPHROLOGY PROGRESS NOTE  Assessment/ Plan: Pt is a 89 y.o. yo female with past medical history significant for HTN, DM, CHF, CKD 4 who was presented with shortness of breath and evidence of peripheral edema, seen as a consultation for AKI on CKD.  # Acute kidney injury on CKD IV-V b/l cr around 2.5-2.8: AKI presumably due to cardiorenal syndrome.  Received IV diuretics yesterday with decent urine output of around 1.5 L. Poor dialysis candidate because of age and reduced functional status.  She was followed by Dr. Rachele as an OP, Seen by palliative care team when she confirms that she does not want dialysis. - Noted both BUN and creatinine level uptrending, transition to oral diuretics especially since she is still fluid overloaded. - Strict ins and outs and daily lab.  # Hypokalemia: Replete potassium chloride , check magnesium  level.  # Acute metabolic encephalopathy: MRI of the brain with sequela of prior hemorrhage and chronic microvascular ischemic change with cerebral atrophy.  # Acute on chronic heart failure with preserved EF: Requiring intermittent IV diuretics.  Switching to oral diuretics today, torsemide  40 mg.  # Hypertensive urgency: Currently on multiple antihypertensives including diltiazem , hydralazine , Imdur  and as needed labetalol .  Diuretics is helping with offloading volume.  Since BP remains above 200 I will start clonidine  3 times daily.  Monitor BP.  Subjective: Seen and examined at bedside.  Urine output of 1.5 L in 24 hours.  Patient is complaining of shortness of breath and looks somewhat confused.  No other event overnight.  Reviewed her medication and lab results. Objective Vital signs in last 24 hours: Vitals:   04/17/24 0148 04/17/24 0419 04/17/24 0530 04/17/24 0824  BP: (!) 204/69 (!) 225/75 (!) 224/69 (!) 196/74  Pulse: 76 79  77  Resp: 19 (!) 22  20  Temp: 98.5 F (36.9 C) 98.8 F (37.1 C)  98.6 F (37 C)  TempSrc: Oral Oral   Oral  SpO2: 99% 98%  98%  Weight:  52 kg    Height:       Weight change: -4.5 kg  Intake/Output Summary (Last 24 hours) at 04/17/2024 0929 Last data filed at 04/17/2024 0422 Gross per 24 hour  Intake 632 ml  Output 1325 ml  Net -693 ml       Labs: RENAL PANEL Recent Labs  Lab 04/12/24 0416 04/13/24 0429 04/14/24 0414 04/15/24 0458 04/16/24 0418 04/17/24 0433  NA 129* 133* 129* 132* 133* 132*  K 2.8* 2.5* 3.9 3.0* 2.9* 3.1*  CL 86* 86* 86* 85* 87* 90*  CO2 31 34* 33* 36* 30 30  GLUCOSE 104* 51* 141* 90 190* 261*  BUN 60* 60* 64* 68* 72* 80*  CREATININE 2.59* 2.60* 2.83* 2.60* 2.68* 2.94*  CALCIUM  8.9 8.9 8.8* 9.2 9.2 8.8*  MG 2.2  --   --   --   --   --   ALBUMIN   --   --   --  3.1* 3.0* 3.0*    Liver Function Tests: Recent Labs  Lab 04/15/24 0458 04/16/24 0418 04/17/24 0433  AST 41 33 25  ALT 29 26 21   ALKPHOS 89 83 79  BILITOT 0.8 0.5 1.0  PROT 5.5* 5.3* 5.2*  ALBUMIN  3.1* 3.0* 3.0*   No results for input(s): LIPASE, AMYLASE in the last 168 hours. No results for input(s): AMMONIA in the last 168 hours. CBC: Recent Labs    07/16/23 9078 07/18/23 1702 02/05/24 0517 02/05/24 9062 02/06/24 0322 04/09/24 0947 04/12/24  9583 04/13/24 0429 04/14/24 0414 04/15/24 0458 04/16/24 0418 04/17/24 0433  HGB 11.0*   < > 8.0*  --    < >  --    < > 7.8* 7.5* 9.9* 10.5* 9.6*  MCV 95   < > 92.1  --    < >  --    < > 93.3 95.4 92.0 93.3 93.7  VITAMINB12  --   --  1,300*  --   --   --   --   --   --   --   --   --   FOLATE  --   --   --  6.7  --   --   --   --   --   --   --   --   FERRITIN 389*  --  107  --   --  92  --   --   --   --   --   --   TIBC 288  --  274  --   --  306  --   --   --   --   --   --   IRON  28  --  46  --   --  60  --   --   --   --   --   --    < > = values in this interval not displayed.    Cardiac Enzymes: No results for input(s): CKTOTAL, CKMB, CKMBINDEX, TROPONINI in the last 168 hours. CBG: Recent Labs  Lab  04/16/24 1058 04/16/24 1630 04/16/24 2023 04/17/24 0258 04/17/24 0820  GLUCAP 262* 278* 332* 291* 257*    Iron  Studies: No results for input(s): IRON , TIBC, TRANSFERRIN, FERRITIN in the last 72 hours. Studies/Results: MR BRAIN WO CONTRAST Result Date: 04/16/2024 CLINICAL DATA:  Mental status change, unknown cause EXAM: MRI HEAD WITHOUT CONTRAST TECHNIQUE: Multiplanar, multiecho pulse sequences of the brain and surrounding structures were obtained without intravenous contrast. COMPARISON:  CT head 05/20/2023. FINDINGS: Brain: Hemosiderin and mild T2 hypointensity in the right cerebellum without edema or mass effect, compatible with the sequela of known prior hemorrhage. No evidence of acute hemorrhage, acute infarct, midline shift or hydrocephalus. Patchy T2/FLAIR hyperintensities the white matter, compatible with chronic microvascular ischemic disease. Cerebral atrophy. Vascular: Major arterial flow voids are maintained at the skull base. Skull and upper cervical spine: Normal marrow signal. Sinuses/Orbits: Partially opacified right sphenoid sinus. No acute orbital findings. Other: No mastoid effusions. IMPRESSION: 1. No evidence of acute intracranial abnormality. 2. Sequela of prior right cerebellar hemorrhage. 3. Chronic microvascular ischemic disease and cerebral atrophy (ICD10-.9). Electronically Signed   By: Gilmore GORMAN Molt M.D.   On: 04/16/2024 22:47    Medications: Infusions:   Scheduled Medications:  Chlorhexidine  Gluconate Cloth  6 each Topical Daily   darbepoetin (ARANESP ) injection - DIALYSIS  60 mcg Subcutaneous Q Wed-1800   diltiazem   60 mg Oral Q6H   feeding supplement  237 mL Oral BID BM   heparin  injection (subcutaneous)  5,000 Units Subcutaneous Q8H   hydrALAZINE   100 mg Oral Q8H   insulin  aspart  0-9 Units Subcutaneous TID WC   insulin  aspart  2 Units Subcutaneous TID WC   insulin  glargine-yfgn  3 Units Subcutaneous Daily   isosorbide  mononitrate  60 mg Oral  Daily    have reviewed scheduled and prn medications.  Physical Exam: General: Elderly ill looking female sitting on bed Heart:RRR, s1s2 nl Lungs: Bilateral coarse crackles, Abdomen:soft, Non-tender,  non-distended Extremities: Bilateral lower extremities pitting edema+ Neurology: Alert awake but confused female.  Azizi Bally Prasad Kilani Joffe 04/17/2024,9:29 AM  LOS: 10 days

## 2024-04-17 NOTE — Progress Notes (Signed)
 Mobility Specialist Progress Note:    04/17/24 1337  Therapy Vitals  Pulse Rate 74  Resp 20  BP (!) 209/69  Patient Position (if appropriate) Sitting  Oxygen Therapy  SpO2 99 %  O2 Device Nasal Cannula  O2 Flow Rate (L/min) 2 L/min  Mobility  Activity Pivoted/transferred from chair to bed  Level of Assistance Moderate assist, patient does 50-74%  Assistive Device Front wheel walker  Range of Motion/Exercises Active;All extremities  Activity Response Tolerated well  Mobility Referral Yes  Mobility visit 1 Mobility  Mobility Specialist Start Time (ACUTE ONLY) 1320  Mobility Specialist Stop Time (ACUTE ONLY) 1335  Mobility Specialist Time Calculation (min) (ACUTE ONLY) 15 min   Pt received requesting assistance to bed. Required ModA to stand and transfer with RW. Tolerated well, pt had LOB with standing, leaning backwards but denies any symptoms. BP 209/69, notified RN. Left pt supine, alarm on. All needs met.   Sherrilee Ditty Mobility Specialist Please contact via Special educational needs teacher or  Rehab office at [phone]

## 2024-04-17 NOTE — Progress Notes (Signed)
 Mobility Specialist Progress Note:    04/17/24 1211  Therapy Vitals  BP (!) 221/80  Mobility  Activity Pivoted/transferred from bed to chair;Ambulated with assistance  Level of Assistance Minimal assist, patient does 75% or more  Assistive Device  (HHA)  Distance Ambulated (ft) 5 ft  Range of Motion/Exercises Active;All extremities  Activity Response Tolerated well  Mobility Referral Yes  Mobility visit 1 Mobility  Mobility Specialist Start Time (ACUTE ONLY) 1156  Mobility Specialist Stop Time (ACUTE ONLY) 1211  Mobility Specialist Time Calculation (min) (ACUTE ONLY) 15 min   Pt received in bed, RN at bedside. Agreeable to mobility, required MinA to stand and ambulate with one person hand-held assist. Tolerated well, asx throughout. Left pt in chair, RN still in room. All needs met.   Sherrilee Ditty Mobility Specialist Please contact via Special educational needs teacher or  Rehab office at [phone]

## 2024-04-17 NOTE — Plan of Care (Signed)
   Problem: Education: Goal: Knowledge of General Education information will improve Description: Including pain rating scale, medication(s)/side effects and non-pharmacologic comfort measures Outcome: Not Met (add Reason)   Problem: Health Behavior/Discharge Planning: Goal: Ability to manage health-related needs will improve Outcome: Not Met (add Reason)   Problem: Clinical Measurements: Goal: Ability to maintain clinical measurements within normal limits will improve Outcome: Not Met (add Reason)

## 2024-04-17 NOTE — Inpatient Diabetes Management (Signed)
 Inpatient Diabetes Program Recommendations  AACE/ADA: New Consensus Statement on Inpatient Glycemic Control (2015)  Target Ranges:  Prepandial:   less than 140 mg/dL      Peak postprandial:   less than 180 mg/dL (1-2 hours)      Critically ill patients:  140 - 180 mg/dL    Review of Glycemic Control  Latest Reference Range & Units 04/16/24 07:31 04/16/24 10:58 04/16/24 16:30 04/16/24 20:23 04/17/24 02:58 04/17/24 08:20  Glucose-Capillary 70 - 99 mg/dL 767 (H) 737 (H) 721 (H) 332 (H) 291 (H) 257 (H)   Diabetes history: DM2 Outpatient Diabetes medications: Tresiba  6 units at bedtime, Humalog  0-12 units TID with meals Current orders for Inpatient glycemic control:  Semglee  3 units qhs Novolog  0-9 units tid Novolog  2 units tid meal coverage   Inpatient Diabetes Program Recommendations:     -   consider also reducing scale to Novolog  0-6 units TID with meals   Thanks, Clotilda Bull RN, MSN, BC-ADM Inpatient Diabetes Coordinator Team Pager [phone] (8a-5p)

## 2024-04-18 DIAGNOSIS — Z66 Do not resuscitate: Secondary | ICD-10-CM | POA: Diagnosis not present

## 2024-04-18 DIAGNOSIS — I5033 Acute on chronic diastolic (congestive) heart failure: Secondary | ICD-10-CM | POA: Diagnosis not present

## 2024-04-18 DIAGNOSIS — E871 Hypo-osmolality and hyponatremia: Secondary | ICD-10-CM | POA: Diagnosis not present

## 2024-04-18 DIAGNOSIS — E1322 Other specified diabetes mellitus with diabetic chronic kidney disease: Secondary | ICD-10-CM | POA: Diagnosis not present

## 2024-04-18 LAB — GLUCOSE, CAPILLARY
Glucose-Capillary: 214 mg/dL — ABNORMAL HIGH (ref 70–99)
Glucose-Capillary: 287 mg/dL — ABNORMAL HIGH (ref 70–99)
Glucose-Capillary: 259 mg/dL — ABNORMAL HIGH (ref 70–99)
Glucose-Capillary: 153 mg/dL — ABNORMAL HIGH (ref 70–99)
Glucose-Capillary: 47 mg/dL — ABNORMAL LOW (ref 70–99)
Glucose-Capillary: 163 mg/dL — ABNORMAL HIGH (ref 70–99)

## 2024-04-18 LAB — MAGNESIUM: Magnesium: 2.2 mg/dL (ref 1.7–2.4)

## 2024-04-18 LAB — RENAL FUNCTION PANEL
Albumin: 2.8 g/dL — ABNORMAL LOW (ref 3.5–5.0)
Anion gap: 16 — ABNORMAL HIGH (ref 5–15)
BUN: 80 mg/dL — ABNORMAL HIGH (ref 8–23)
CO2: 29 mmol/L (ref 22–32)
Calcium: 8.8 mg/dL — ABNORMAL LOW (ref 8.9–10.3)
Chloride: 90 mmol/L — ABNORMAL LOW (ref 98–111)
Creatinine, Ser: 3.01 mg/dL — ABNORMAL HIGH (ref 0.44–1.00)
GFR, Estimated: 14 mL/min — ABNORMAL LOW (ref >60)
Glucose, Bld: 220 mg/dL — ABNORMAL HIGH (ref 70–99)
Phosphorus: 4 mg/dL (ref 2.5–4.6)
Potassium: 3.5 mmol/L (ref 3.5–5.1)
Sodium: 135 mmol/L (ref 135–145)

## 2024-04-18 LAB — CBC
HCT: 28.1 % — ABNORMAL LOW (ref 36.0–46.0)
Hemoglobin: 9.5 g/dL — ABNORMAL LOW (ref 12.0–15.0)
MCH: 32.5 pg (ref 26.0–34.0)
MCHC: 33.8 g/dL (ref 30.0–36.0)
MCV: 96.2 fL (ref 80.0–100.0)
Platelets: 103 K/uL — ABNORMAL LOW (ref 150–400)
RBC: 2.92 MIL/uL — ABNORMAL LOW (ref 3.87–5.11)
RDW: 14.8 % (ref 11.5–15.5)
WBC: 6.3 K/uL (ref 4.0–10.5)
nRBC: 0 % (ref 0.0–0.2)

## 2024-04-18 LAB — BRAIN NATRIURETIC PEPTIDE: B Natriuretic Peptide: 2036 pg/mL — ABNORMAL HIGH (ref 0.0–100.0)

## 2024-04-18 MED ORDER — POTASSIUM CHLORIDE CRYS ER 20 MEQ PO TBCR
20.0000 meq | EXTENDED_RELEASE_TABLET | Freq: Every day | ORAL | Status: DC
  Administered 2024-04-19 – 2024-04-21 (×3): 20 meq via ORAL
  Filled 2024-04-18 (×3): qty 1

## 2024-04-18 MED ORDER — DEXTROSE 50 % IV SOLN
25.0000 g | INTRAVENOUS | Status: AC
  Administered 2024-04-18: 25 g via INTRAVENOUS

## 2024-04-18 MED ORDER — CLONIDINE HCL 0.2 MG PO TABS
0.2000 mg | ORAL_TABLET | Freq: Three times a day (TID) | ORAL | Status: DC
  Administered 2024-04-18 – 2024-04-21 (×11): 0.2 mg via ORAL
  Filled 2024-04-18 (×10): qty 1

## 2024-04-18 MED ORDER — INSULIN GLARGINE-YFGN 100 UNIT/ML ~~LOC~~ SOLN
6.0000 [IU] | Freq: Every day | SUBCUTANEOUS | Status: DC
  Filled 2024-04-18 (×2): qty 0.06

## 2024-04-18 MED ORDER — DEXTROSE 50 % IV SOLN
INTRAVENOUS | Status: AC
  Filled 2024-04-18: qty 50

## 2024-04-18 NOTE — Plan of Care (Signed)
   Problem: Education: Goal: Knowledge of General Education information will improve Description: Including pain rating scale, medication(s)/side effects and non-pharmacologic comfort measures Outcome: Not Progressing

## 2024-04-18 NOTE — Plan of Care (Signed)
   Problem: Education: Goal: Knowledge of General Education information will improve Description: Including pain rating scale, medication(s)/side effects and non-pharmacologic comfort measures Outcome: Progressing   Problem: Clinical Measurements: Goal: Ability to maintain clinical measurements within normal limits will improve Outcome: Progressing Goal: Diagnostic test results will improve Outcome: Progressing

## 2024-04-18 NOTE — Progress Notes (Addendum)
 [facility] NEPHROLOGY PROGRESS NOTE  Assessment/ Plan: Pt is a 89 y.o. yo female with past medical history significant for HTN, DM, CHF, CKD 4 who was presented with shortness of breath and evidence of peripheral edema, seen as a consultation for AKI on CKD.  # Acute kidney injury on CKD IV-V b/l cr around 2.5-2.8: AKI presumably due to cardiorenal syndrome.  Required intermittent IV diuretics and switch to oral torsemide  on 7/31.  She is nonoliguric. Poor dialysis candidate because of age and reduced functional status.  She was followed by Dr. Rachele as an OP, Seen by palliative care team when she confirms that she does not want dialysis.  Recommend care focused on palliative and comfort measures. - Strict ins and outs and daily lab.  # Hypokalemia: Required potassium chloride  repletion, magnesium  level acceptable.  I will start p.o. KCl especially since she is on diuretics.    # Acute metabolic encephalopathy: MRI of the brain with sequela of prior hemorrhage and chronic microvascular ischemic change with cerebral atrophy.  # Acute on chronic heart failure with preserved EF: Requiring intermittent IV diuretics.  Switched to oral diuretics, torsemide  40 mg.  # Hypertensive urgency: Blood pressure remains elevated even with multiple antihypertensives including diltiazem , hydralazine , Imdur  and as needed labetalol .  Diuretics is helping with offloading volume.  Started clonidine  on 7/31, I will increase dose to 0.2 mg 3 times daily.   Monitor BP.  Avoid hypotensive episode in this elderly female.  Please call nephrology on-call with any question over the weekend.  Subjective: Seen and examined at bedside.  Urine output is around 700 cc.  No event overnight.  Family member including her nephew and his family's presented at the bedside.  Objective Vital signs in last 24 hours: Vitals:   04/18/24 0130 04/18/24 0215 04/18/24 0525 04/18/24 0630  BP: (!) 215/67 (!) 198/67 (!)  224/80 (!) 190/79  Pulse:   79   Resp: 20  20   Temp:   99.1 F (37.3 C)   TempSrc:   Oral   SpO2: 93%  97%   Weight:   55 kg   Height:       Weight change: 3 kg  Intake/Output Summary (Last 24 hours) at 04/18/2024 1111 Last data filed at 04/18/2024 0518 Gross per 24 hour  Intake 320 ml  Output 650 ml  Net -330 ml       Labs: RENAL PANEL Recent Labs  Lab 04/12/24 0416 04/13/24 0429 04/14/24 0414 04/15/24 0458 04/16/24 0418 04/17/24 0433 04/18/24 0453  NA 129*   < > 129* 132* 133* 132* 135  K 2.8*   < > 3.9 3.0* 2.9* 3.1* 3.5  CL 86*   < > 86* 85* 87* 90* 90*  CO2 31   < > 33* 36* 30 30 29   GLUCOSE 104*   < > 141* 90 190* 261* 220*  BUN 60*   < > 64* 68* 72* 80* 80*  CREATININE 2.59*   < > 2.83* 2.60* 2.68* 2.94* 3.01*  CALCIUM  8.9   < > 8.8* 9.2 9.2 8.8* 8.8*  MG 2.2  --   --   --   --   --  2.2  PHOS  --   --   --   --   --   --  4.0  ALBUMIN   --   --   --  3.1* 3.0* 3.0* 2.8*   < > = values in this interval not displayed.  Liver Function Tests: Recent Labs  Lab 04/15/24 0458 04/16/24 0418 04/17/24 0433 04/18/24 0453  AST 41 33 25  --   ALT 29 26 21   --   ALKPHOS 89 83 79  --   BILITOT 0.8 0.5 1.0  --   PROT 5.5* 5.3* 5.2*  --   ALBUMIN  3.1* 3.0* 3.0* 2.8*   No results for input(s): LIPASE, AMYLASE in the last 168 hours. No results for input(s): AMMONIA in the last 168 hours. CBC: Recent Labs    07/16/23 0921 07/18/23 1702 02/05/24 0517 02/05/24 9062 02/06/24 0322 04/09/24 0947 04/12/24 0416 04/14/24 0414 04/15/24 0458 04/16/24 0418 04/17/24 0433 04/18/24 0453  HGB 11.0*   < > 8.0*  --    < >  --    < > 7.5* 9.9* 10.5* 9.6* 9.5*  MCV 95   < > 92.1  --    < >  --    < > 95.4 92.0 93.3 93.7 96.2  VITAMINB12  --   --  1,300*  --   --   --   --   --   --   --   --   --   FOLATE  --   --   --  6.7  --   --   --   --   --   --   --   --   FERRITIN 389*  --  107  --   --  92  --   --   --   --   --   --   TIBC 288  --  274  --   --   306  --   --   --   --   --   --   IRON  28  --  46  --   --  60  --   --   --   --   --   --    < > = values in this interval not displayed.    Cardiac Enzymes: No results for input(s): CKTOTAL, CKMB, CKMBINDEX, TROPONINI in the last 168 hours. CBG: Recent Labs  Lab 04/17/24 1120 04/17/24 1622 04/17/24 2029 04/18/24 0238 04/18/24 0723  GLUCAP 241* 165* 143* 214* 287*    Iron  Studies: No results for input(s): IRON , TIBC, TRANSFERRIN, FERRITIN in the last 72 hours. Studies/Results: MR BRAIN WO CONTRAST Result Date: 04/16/2024 CLINICAL DATA:  Mental status change, unknown cause EXAM: MRI HEAD WITHOUT CONTRAST TECHNIQUE: Multiplanar, multiecho pulse sequences of the brain and surrounding structures were obtained without intravenous contrast. COMPARISON:  CT head 05/20/2023. FINDINGS: Brain: Hemosiderin and mild T2 hypointensity in the right cerebellum without edema or mass effect, compatible with the sequela of known prior hemorrhage. No evidence of acute hemorrhage, acute infarct, midline shift or hydrocephalus. Patchy T2/FLAIR hyperintensities the white matter, compatible with chronic microvascular ischemic disease. Cerebral atrophy. Vascular: Major arterial flow voids are maintained at the skull base. Skull and upper cervical spine: Normal marrow signal. Sinuses/Orbits: Partially opacified right sphenoid sinus. No acute orbital findings. Other: No mastoid effusions. IMPRESSION: 1. No evidence of acute intracranial abnormality. 2. Sequela of prior right cerebellar hemorrhage. 3. Chronic microvascular ischemic disease and cerebral atrophy (ICD10-.9). Electronically Signed   By: Gilmore GORMAN Molt M.D.   On: 04/16/2024 22:47    Medications: Infusions:   Scheduled Medications:  Chlorhexidine  Gluconate Cloth  6 each Topical Daily   cloNIDine   0.1 mg Oral TID   darbepoetin (ARANESP ) injection -  DIALYSIS  60 mcg Subcutaneous Q Wed-1800   diltiazem   60 mg Oral Q6H   feeding  supplement  237 mL Oral BID BM   heparin  injection (subcutaneous)  5,000 Units Subcutaneous Q8H   hydrALAZINE   100 mg Oral Q8H   insulin  aspart  0-9 Units Subcutaneous TID WC   insulin  aspart  2 Units Subcutaneous TID WC   insulin  glargine-yfgn  3 Units Subcutaneous Daily   isosorbide  mononitrate  60 mg Oral Daily   potassium chloride   40 mEq Oral BID   torsemide   40 mg Oral Daily    have reviewed scheduled and prn medications.  Physical Exam: General: Elderly ill looking female sitting on bed Heart:RRR, s1s2 nl Lungs: Bilateral coarse crackles, Abdomen:soft, Non-tender, non-distended Extremities: Bilateral lower extremities pitting edema+ Neurology: Alert awake but confused female.  Emily [facility] Prasad Malerie Eakins 04/18/2024,11:11 AM  LOS: 11 days

## 2024-04-18 NOTE — Progress Notes (Signed)
 PROGRESS NOTE   Emily Jennings  FMW:[id number] DOB: [birth year] DOA: 04/07/2024 PCP: Emily Rollene BRAVO, MD   Chief Complaint  Patient presents with   Shortness of Breath   Level of care: Telemetry  Brief Admission History:  89 y.o. female with medical history significant for CHF, diabetes mellitus, hypertension, CKD 4.  Patient presented to the ED with complaints of difficulty breathing over the past 2 days, swelling to bilateral lower extremities up to her thighs, swelling of her arms.  She reports weight gain from 128-135 today.  She reports compliance with her medication-torsemide  60/40mg . Torsemide  dose was increased 7/15.   Recent hospitalizations for same-most recent 6/23 to 6/25.   ED Course: Temperature 92.8.  Heart rate 56-64.  Respirate rate 11-25.  Blood pressure systolic 143-174.  O2 sat greater than 95% on room air.  BNP 962.  Troponin 6 > 7.  Chest x-ray small bilateral pleural effusions.  Lasix  60 mg x 1 given   Assessment and Plan:  Decompensated bradycardia and hypotension -on 04/13/2024 around 1830 - Patient's heart rate was around 40, blood pressure 77/38 - RESOLVED  - Patient was given atropine  x 1, midodrine  10 mg p.o. x 1, 500 mL of bolus saline - Was transferred to ICU overnight for close observation, now she is back on telemetry unit   -Patient received diltiazem  90 mg x 1 on 04/13/2024 - Current medication of hydralazine /isosorbide , labetalol  and clonidine  and torsemide  was discontinued    Acute HFpEF  Question of cardiorenal syndrome - Progressive heart failure-  - Developing progressive cardiorenal syndrome - Cardiology and nephrology consulted, cardio signed off   -During this admission patient has been treated aggressively with diuretics  Lasix  120 mg every 8 hours Till 04/12/24  switched to Demadex  40 mg twice daily on 7/27 Back to IV Lasix  40 mg twice daily started 04/14/2024, increased to 120 mg BID by nephrologist on  7/31: nephrology team starting  torsemide  40 mg daily    Filed Weights   04/16/24 0436 04/17/24 0419 04/18/24 0525  Weight: 56.5 kg 52 kg 55 kg    Intake/Output Summary (Last 24 hours) at 04/18/2024 1527 Last data filed at 04/18/2024 0518 Gross per 24 hour  Intake 320 ml  Output 650 ml  Net -330 ml    -Following daily weights, strict I's and O's, Reds clip measurements and low-sodium diet. - Recent 2D echo demonstrating ejection fraction 70 to 75% with grade 2 diastolic dysfunction (no need to be repeated).   - Improved anasarca, improved orthopnea   Hypertensive crisis -Persistent/resistant hypertension Was on clonidine /hydralazine /labetalol /Cardizem -was discontinued 04/13/2024 - now she is on hydralazine , diltiazem , imdur , torsemide , clonidine    Acute Encephalopathy - agree with nephrologist, ordered MRI brain to rule out PRES, CVA - no acute findings   Type 2 diabetes mellitus with nephropathy - A1c 7.4 - CBG q. ACHS, SSI coverage - increasing semglee  to 6 units daily with plan to slowly titrate doses  - added renal dose prandial coverage novolog  (3 units TID with meals eaten>50%)  CBG (last 3)  Recent Labs    04/18/24 0238 04/18/24 0723 04/18/24 1126  GLUCAP 214* 287* 259*   Chronic kidney disease stage IV - Monitoring closely -Nephrology following-no mass dialysis -Continue to follow renal function trend stability especially with ongoing IV diuresis. - Follow electrolytes and replete them as needed. Recent Labs       Lab Results  Component Value Date    CREATININE 2.83 (H) 04/14/2024    CREATININE 2.60 (  H) 04/13/2024    CREATININE 2.59 (H) 04/12/2024    Nephrology -appreciate follow-up and recommendations    Hyponatremia - Much improved - Continue treatment with diuretics - Follow electrolytes trend - Patient asymptomatic. - Serum sodium level: 123, 123, 125, 126>>132 - Fluid restriction 1200 mL per day   Hypokalemia/hypomagnesemia K 3.2, 2.8, 2.5,>>> 3.9  -Mag 2.2 -Monitoring and  repleting   Anemia of chronic disease due to chronic kidney disease -Monitoring H&H closely - Follow-up with iron  studies - Nephrology added Aranesp  60 mg q. Weekly     Latest Ref Rng & Units 04/14/2024    4:14 AM 04/13/2024    4:29 AM 04/12/2024    4:16 AM  CBC  WBC 4.0 - 10.5 K/uL 4.0  4.3  4.1   Hemoglobin 12.0 - 15.0 g/dL 7.5  7.8  8.2   Hematocrit 36.0 - 46.0 % 23.0  23.5  23.7   Platelets 150 - 400 K/uL 139  140  140     - Due to cardiorenal syndrome, hypotension - 04/14/2024 transfusing 1U PRBC   DVT prophylaxis: sq  heparin  Code Status: DNR  Family Communication:  Disposition: anticipating home with hospice, family refusing SNF at this time   Consultants:  Nephrology Cardiology - signed off  Procedures:   Antimicrobials:    Subjective: Pt eating a little better today with assistance. Denies CP and SOB.    Objective: Vitals:   04/18/24 0215 04/18/24 0525 04/18/24 0630 04/18/24 1250  BP: (!) 198/67 (!) 224/80 (!) 190/79 (!) 193/80  Pulse:  79  82  Resp:  20  14  Temp:  99.1 F (37.3 C)  98.9 F (37.2 C)  TempSrc:  Oral  Oral  SpO2:  97%  100%  Weight:  55 kg    Height:        Intake/Output Summary (Last 24 hours) at 04/18/2024 1527 Last data filed at 04/18/2024 0518 Gross per 24 hour  Intake 320 ml  Output 650 ml  Net -330 ml   Filed Weights   04/16/24 0436 04/17/24 0419 04/18/24 0525  Weight: 56.5 kg 52 kg 55 kg   Examination:  General exam: frail, elderly female, severely deconditioned. Calm, NAD.    Respiratory system: Clear to auscultation. Respiratory effort normal. Cardiovascular system: normal S1 & S2 heard. No JVD, murmurs, rubs, gallops or clicks. No pedal edema. Gastrointestinal system: Abdomen is nondistended, soft and nontender. No organomegaly or masses felt. Normal bowel sounds heard. Central nervous system:  No focal neurological deficits. Extremities: persistent peripheral edema seen. Symmetric 5 x 5 power. Skin: No rashes, lesions  or ulcers. Psychiatry: Judgement and insight appear diminished. Mood & affect normal.   Data Reviewed: I have personally reviewed following labs and imaging studies  CBC: Recent Labs  Lab 04/14/24 0414 04/15/24 0458 04/16/24 0418 04/17/24 0433 04/18/24 0453  WBC 4.0 5.1 5.2 5.5 6.3  HGB 7.5* 9.9* 10.5* 9.6* 9.5*  HCT 23.0* 29.8* 31.9* 28.3* 28.1*  MCV 95.4 92.0 93.3 93.7 96.2  PLT 139* 120* 119* 116* 103*    Basic Metabolic Panel: Recent Labs  Lab 04/12/24 0416 04/13/24 0429 04/14/24 0414 04/15/24 0458 04/16/24 0418 04/17/24 0433 04/18/24 0453  NA 129*   < > 129* 132* 133* 132* 135  K 2.8*   < > 3.9 3.0* 2.9* 3.1* 3.5  CL 86*   < > 86* 85* 87* 90* 90*  CO2 31   < > 33* 36* 30 30 29   GLUCOSE 104*   < >  141* 90 190* 261* 220*  BUN 60*   < > 64* 68* 72* 80* 80*  CREATININE 2.59*   < > 2.83* 2.60* 2.68* 2.94* 3.01*  CALCIUM  8.9   < > 8.8* 9.2 9.2 8.8* 8.8*  MG 2.2  --   --   --   --   --  2.2  PHOS  --   --   --   --   --   --  4.0   < > = values in this interval not displayed.    CBG: Recent Labs  Lab 04/17/24 1622 04/17/24 2029 04/18/24 0238 04/18/24 0723 04/18/24 1126  GLUCAP 165* 143* 214* 287* 259*    No results found for this or any previous visit (from the past 240 hours).    Radiology Studies: MR BRAIN WO CONTRAST Result Date: 04/16/2024 CLINICAL DATA:  Mental status change, unknown cause EXAM: MRI HEAD WITHOUT CONTRAST TECHNIQUE: Multiplanar, multiecho pulse sequences of the brain and surrounding structures were obtained without intravenous contrast. COMPARISON:  CT head 05/20/2023. FINDINGS: Brain: Hemosiderin and mild T2 hypointensity in the right cerebellum without edema or mass effect, compatible with the sequela of known prior hemorrhage. No evidence of acute hemorrhage, acute infarct, midline shift or hydrocephalus. Patchy T2/FLAIR hyperintensities the white matter, compatible with chronic microvascular ischemic disease. Cerebral atrophy.  Vascular: Major arterial flow voids are maintained at the skull base. Skull and upper cervical spine: Normal marrow signal. Sinuses/Orbits: Partially opacified right sphenoid sinus. No acute orbital findings. Other: No mastoid effusions. IMPRESSION: 1. No evidence of acute intracranial abnormality. 2. Sequela of prior right cerebellar hemorrhage. 3. Chronic microvascular ischemic disease and cerebral atrophy (ICD10-.9). Electronically Signed   By: Gilmore GORMAN Molt M.D.   On: 04/16/2024 22:47    Scheduled Meds:  Chlorhexidine  Gluconate Cloth  6 each Topical Daily   cloNIDine   0.2 mg Oral TID   darbepoetin (ARANESP ) injection - DIALYSIS  60 mcg Subcutaneous Q Wed-1800   diltiazem   60 mg Oral Q6H   feeding supplement  237 mL Oral BID BM   heparin  injection (subcutaneous)  5,000 Units Subcutaneous Q8H   hydrALAZINE   100 mg Oral Q8H   insulin  aspart  0-9 Units Subcutaneous TID WC   insulin  aspart  2 Units Subcutaneous TID WC   [START ON 04/19/2024] insulin  glargine-yfgn  6 Units Subcutaneous Daily   isosorbide  mononitrate  60 mg Oral Daily   [START ON 04/19/2024] potassium chloride   20 mEq Oral Daily   torsemide   40 mg Oral Daily   Continuous Infusions:   LOS: 11 days   Time spent: 55 mins  Terre Hanneman Vicci, MD How to contact the [facility] Attending or Consulting provider 7A - 7P or covering provider during after hours 7P -7A, for this patient?  Check the care team in [facility] and look for a) attending/consulting TRH provider listed and b) the TRH team listed Log into [email or web address] to find provider on call.  Locate the TRH provider you are looking for under [facility] and page to a number that you can be directly reached. If you still have difficulty reaching the provider, please page the [facility] (Director on Call) for the Hospitalists listed on amion for assistance.  04/18/2024, 3:27 PM

## 2024-04-19 DIAGNOSIS — E1322 Other specified diabetes mellitus with diabetic chronic kidney disease: Secondary | ICD-10-CM | POA: Diagnosis not present

## 2024-04-19 DIAGNOSIS — I5033 Acute on chronic diastolic (congestive) heart failure: Secondary | ICD-10-CM | POA: Diagnosis not present

## 2024-04-19 DIAGNOSIS — Z66 Do not resuscitate: Secondary | ICD-10-CM | POA: Diagnosis not present

## 2024-04-19 DIAGNOSIS — E871 Hypo-osmolality and hyponatremia: Secondary | ICD-10-CM | POA: Diagnosis not present

## 2024-04-19 LAB — CBC
HCT: 27 % — ABNORMAL LOW (ref 36.0–46.0)
Hemoglobin: 8.9 g/dL — ABNORMAL LOW (ref 12.0–15.0)
MCH: 32.1 pg (ref 26.0–34.0)
MCHC: 33 g/dL (ref 30.0–36.0)
MCV: 97.5 fL (ref 80.0–100.0)
Platelets: 98 K/uL — ABNORMAL LOW (ref 150–400)
RBC: 2.77 MIL/uL — ABNORMAL LOW (ref 3.87–5.11)
RDW: 14.7 % (ref 11.5–15.5)
WBC: 7 K/uL (ref 4.0–10.5)
nRBC: 0 % (ref 0.0–0.2)

## 2024-04-19 LAB — RENAL FUNCTION PANEL
Albumin: 2.5 g/dL — ABNORMAL LOW (ref 3.5–5.0)
Anion gap: 13 (ref 5–15)
BUN: 82 mg/dL — ABNORMAL HIGH (ref 8–23)
CO2: 31 mmol/L (ref 22–32)
Calcium: 8.6 mg/dL — ABNORMAL LOW (ref 8.9–10.3)
Chloride: 92 mmol/L — ABNORMAL LOW (ref 98–111)
Creatinine, Ser: 3.1 mg/dL — ABNORMAL HIGH (ref 0.44–1.00)
GFR, Estimated: 14 mL/min — ABNORMAL LOW (ref >60)
Glucose, Bld: 153 mg/dL — ABNORMAL HIGH (ref 70–99)
Phosphorus: 3.9 mg/dL (ref 2.5–4.6)
Potassium: 3.8 mmol/L (ref 3.5–5.1)
Sodium: 136 mmol/L (ref 135–145)

## 2024-04-19 LAB — GLUCOSE, CAPILLARY
Glucose-Capillary: 83 mg/dL (ref 70–99)
Glucose-Capillary: 184 mg/dL — ABNORMAL HIGH (ref 70–99)
Glucose-Capillary: 326 mg/dL — ABNORMAL HIGH (ref 70–99)
Glucose-Capillary: 278 mg/dL — ABNORMAL HIGH (ref 70–99)
Glucose-Capillary: 215 mg/dL — ABNORMAL HIGH (ref 70–99)

## 2024-04-19 MED ORDER — ONDANSETRON HCL 4 MG/2ML IJ SOLN
4.0000 mg | Freq: Four times a day (QID) | INTRAMUSCULAR | Status: DC | PRN
  Administered 2024-04-19: 4 mg via INTRAVENOUS
  Filled 2024-04-19: qty 2

## 2024-04-19 MED ORDER — INSULIN GLARGINE-YFGN 100 UNIT/ML ~~LOC~~ SOLN
3.0000 [IU] | Freq: Every day | SUBCUTANEOUS | Status: DC
  Administered 2024-04-19: 3 [IU] via SUBCUTANEOUS
  Filled 2024-04-19 (×3): qty 0.03

## 2024-04-19 NOTE — Progress Notes (Signed)
 PROGRESS NOTE   Emily Jennings  FMW:[id number] DOB: [birth year] DOA: 04/07/2024 PCP: Antonetta Rollene BRAVO, MD   Chief Complaint  Patient presents with   Shortness of Breath   Level of care: Telemetry  Brief Admission History:  89 y.o. female with medical history significant for CHF, diabetes mellitus, hypertension, CKD 4.  Patient presented to the ED with complaints of difficulty breathing over the past 2 days, swelling to bilateral lower extremities up to her thighs, swelling of her arms.  She reports weight gain from 128-135 today.  She reports compliance with her medication-torsemide  60/40mg . Torsemide  dose was increased 7/15.   Recent hospitalizations for same-most recent 6/23 to 6/25.   ED Course: Temperature 92.8.  Heart rate 56-64.  Respirate rate 11-25.  Blood pressure systolic 143-174.  O2 sat greater than 95% on room air.  BNP 962.  Troponin 6 > 7.  Chest x-ray small bilateral pleural effusions.  Lasix  60 mg x 1 given   Assessment and Plan:  Decompensated bradycardia and hypotension -on 04/13/2024 around 1830 - Patient's heart rate was around 40, blood pressure 77/38 - RESOLVED  - Patient was given atropine  x 1, midodrine  10 mg p.o. x 1, 500 mL of bolus saline - Was transferred to ICU overnight for close observation, now she is back on telemetry unit  -Patient received diltiazem  90 mg x 1 on 04/13/2024    Acute HFpEF  Question of cardiorenal syndrome - Progressive heart failure-  - Developing progressive cardiorenal syndrome - Cardiology and nephrology signed off   -During this admission patient has been treated aggressively with diuretics  Lasix  120 mg every 8 hours Till 04/12/24  switched to Demadex  40 mg twice daily on 7/27 Back to IV Lasix  40 mg twice daily started 04/14/2024, increased to 120 mg BID by nephrologist on  7/31: nephrology team started torsemide  40 mg daily    Filed Weights   04/17/24 0419 04/18/24 0525 04/19/24 0420  Weight: 52 kg 55 kg 55.1 kg     Intake/Output Summary (Last 24 hours) at 04/19/2024 1311 Last data filed at 04/19/2024 0848 Gross per 24 hour  Intake 720 ml  Output 700 ml  Net 20 ml    -Following daily weights, strict I's and O's, Reds clip measurements and low-sodium diet. - Recent 2D echo demonstrating ejection fraction 70 to 75% with grade 2 diastolic dysfunction (no need to be repeated).   - Improved anasarca, improved orthopnea   Hypertensive crisis -Persistent/resistant hypertension Was on clonidine /hydralazine /labetalol /Cardizem -was discontinued 04/13/2024 - now she is on hydralazine , diltiazem , imdur , torsemide , clonidine    Acute Encephalopathy - agree with nephrologist, ordered MRI brain to rule out PRES, CVA - no acute findings   Type 2 diabetes mellitus with nephropathy - A1c 7.4 - CBG q. ACHS, SSI coverage - increasing semglee  to 6 units daily with plan to slowly titrate doses  - added renal dose prandial coverage novolog  (3 units TID with meals eaten>50%)  CBG (last 3)  Recent Labs    04/19/24 0233 04/19/24 0745 04/19/24 1114  GLUCAP 83 184* 326*   Chronic kidney disease stage IV - Monitoring closely -Nephrology following-no mass dialysis -Continue to follow renal function trend stability especially with ongoing IV diuresis. - Follow electrolytes and replete them as needed. Recent Labs       Lab Results  Component Value Date    CREATININE 2.83 (H) 04/14/2024    CREATININE 2.60 (H) 04/13/2024    CREATININE 2.59 (H) 04/12/2024    Nephrology -appreciate follow-up  and recommendations, now signed off -if renal function worsens per goals of care discussions would transition to full comfort care     Hyponatremia - RESOLVED  - Much improved with diuresis - Continue treatment with diuretics - Follow electrolytes trend - Patient asymptomatic. - Serum sodium level: 123, 123, 125, 126>>132>136 - Fluid restriction 1200 mL per day   Hypokalemia/hypomagnesemia K 3.2, 2.8, 2.5,>>> 3.9  -Mag  2.2 -repleted   Anemia of chronic disease due to chronic kidney disease -Monitoring H&H closely - Follow-up with iron  studies - Nephrology added Aranesp  60 mg q. Weekly   - Due to cardiorenal syndrome, hypotension - 04/14/2024 transfused 1U PRBC   DVT prophylaxis: sq  heparin  Code Status: DNR  Family Communication:  Disposition: anticipating home with HH in next 1-2 days, family declines SNF at this time   Consultants:  Nephrology Cardiology - signed off  Procedures:   Antimicrobials:    Subjective: Pt reports her legs seem less swollen today, denies headache, SOB and CP.     Objective: Vitals:   04/18/24 2342 04/19/24 0420 04/19/24 0526 04/19/24 0614  BP: (!) 180/66 (!) 211/78 (!) 209/73 (!) 158/54  Pulse: 75 77  65  Resp:  20  20  Temp:  98.5 F (36.9 C)  98 F (36.7 C)  TempSrc:  Oral  Oral  SpO2:  100%  100%  Weight:  55.1 kg    Height:        Intake/Output Summary (Last 24 hours) at 04/19/2024 1311 Last data filed at 04/19/2024 0848 Gross per 24 hour  Intake 720 ml  Output 700 ml  Net 20 ml   Filed Weights   04/17/24 0419 04/18/24 0525 04/19/24 0420  Weight: 52 kg 55 kg 55.1 kg   Examination:  General exam: frail, elderly female, severely deconditioned. Calm, NAD.    Respiratory system: Clear to auscultation. Respiratory effort normal. Cardiovascular system: normal S1 & S2 heard. No JVD, murmurs, rubs, gallops or clicks. No pedal edema. Gastrointestinal system: Abdomen is nondistended, soft and nontender. No organomegaly or masses felt. Normal bowel sounds heard. Central nervous system:  No focal neurological deficits. Extremities: persistent peripheral edema with some skin wrinkling and mild improvement in edema seen. Symmetric 5 x 5 power. Skin: No rashes, lesions or ulcers. Psychiatry: Judgement and insight appear diminished. Mood & affect normal.   Data Reviewed: I have personally reviewed following labs and imaging studies  CBC: Recent Labs   Lab 04/15/24 0458 04/16/24 0418 04/17/24 0433 04/18/24 0453 04/19/24 0609  WBC 5.1 5.2 5.5 6.3 7.0  HGB 9.9* 10.5* 9.6* 9.5* 8.9*  HCT 29.8* 31.9* 28.3* 28.1* 27.0*  MCV 92.0 93.3 93.7 96.2 97.5  PLT 120* 119* 116* 103* 98*    Basic Metabolic Panel: Recent Labs  Lab 04/15/24 0458 04/16/24 0418 04/17/24 0433 04/18/24 0453 04/19/24 0609  NA 132* 133* 132* 135 136  K 3.0* 2.9* 3.1* 3.5 3.8  CL 85* 87* 90* 90* 92*  CO2 36* 30 30 29 31   GLUCOSE 90 190* 261* 220* 153*  BUN 68* 72* 80* 80* 82*  CREATININE 2.60* 2.68* 2.94* 3.01* 3.10*  CALCIUM  9.2 9.2 8.8* 8.8* 8.6*  MG  --   --   --  2.2  --   PHOS  --   --   --  4.0 3.9    CBG: Recent Labs  Lab 04/18/24 2126 04/18/24 2155 04/19/24 0233 04/19/24 0745 04/19/24 1114  GLUCAP 47* 163* 83 184* 326*   No results  found for this or any previous visit (from the past 240 hours).   Radiology Studies: No results found.  Scheduled Meds:  Chlorhexidine  Gluconate Cloth  6 each Topical Daily   cloNIDine   0.2 mg Oral TID   darbepoetin (ARANESP ) injection - DIALYSIS  60 mcg Subcutaneous Q Wed-1800   diltiazem   60 mg Oral Q6H   feeding supplement  237 mL Oral BID BM   heparin  injection (subcutaneous)  5,000 Units Subcutaneous Q8H   hydrALAZINE   100 mg Oral Q8H   insulin  aspart  0-9 Units Subcutaneous TID WC   insulin  aspart  2 Units Subcutaneous TID WC   insulin  glargine-yfgn  3 Units Subcutaneous Daily   isosorbide  mononitrate  60 mg Oral Daily   potassium chloride   20 mEq Oral Daily   torsemide   40 mg Oral Daily   Continuous Infusions:   LOS: 12 days   Time spent: 55 mins  Ishmeal Rorie Vicci, MD How to contact the [facility] Attending or Consulting provider 7A - 7P or covering provider during after hours 7P -7A, for this patient?  Check the care team in [facility] and look for a) attending/consulting TRH provider listed and b) the TRH team listed Log into [email or web address] to find provider on call.  Locate the TRH provider you are  looking for under [facility] and page to a number that you can be directly reached. If you still have difficulty reaching the provider, please page the [facility] (Director on Call) for the Hospitalists listed on amion for assistance.  04/19/2024, 1:11 PM

## 2024-04-19 NOTE — Progress Notes (Signed)
 Pt blood glucose at 2126 was 47. 25 g of Dextrose  IV given per protocol. Rechecked pt blood glucose now and it was 163. On call provider notified and aware.

## 2024-04-19 NOTE — Progress Notes (Signed)
   04/19/24 0420  Assess: MEWS Score  Temp 98.5 F (36.9 C)  BP (!) 211/78  MAP (mmHg) 114  Pulse Rate 77  Resp 20  SpO2 100 %  O2 Device Nasal Cannula  O2 Flow Rate (L/min) 2 L/min  Assess: MEWS Score  MEWS Temp 0  MEWS Systolic 2  MEWS Pulse 0  MEWS RR 0  MEWS LOC 0  MEWS Score 2  MEWS Score Color Yellow  Assess: if the MEWS score is Yellow or Red  Were vital signs accurate and taken at a resting state? Yes  Does the patient meet 2 or more of the SIRS criteria? No  MEWS guidelines implemented  Yes, yellow  Treat  MEWS Interventions Considered administering scheduled or prn medications/treatments as ordered  Take Vital Signs  Increase Vital Sign Frequency  Yellow: Q2hr x1, continue Q4hrs until patient remains green for 12hrs  Escalate  MEWS: Escalate Yellow: Discuss with charge nurse and consider notifying provider and/or RRT  Notify: Charge Nurse/RN  Name of Charge Nurse/RN Notified Gerard LOISE Schneiders, RN  Provider Notification  Provider Name/Title Mauricio Toribio Furnace, MD  Date Provider Notified 04/19/24  Time Provider Notified 0500  Method of Notification Page  Notification Reason Change in status (Yellow MEWS due to BP 211/78)  Provider response See new orders  Date of Provider Response 04/19/24  Time of Provider Response 0525  Assess: SIRS CRITERIA  SIRS Temperature  0  SIRS Respirations  0  SIRS Pulse 0  SIRS WBC 0  SIRS Score Sum  0

## 2024-04-19 NOTE — Progress Notes (Signed)
 Brief Nephrology Note  Patient Crt stable essentially at BL so no AKI. BP remains very high despite aggressive efforts. No plans for HD based on GOC conversations. Not much to add at this time. Only other than I could suggest for HTN would be trial of a thiazide diuretic with the potential of causing worsening GFR and further compromise of kidney disease. Best to focus on symptom management.  We will sign off.

## 2024-04-20 DIAGNOSIS — I5033 Acute on chronic diastolic (congestive) heart failure: Secondary | ICD-10-CM | POA: Diagnosis not present

## 2024-04-20 DIAGNOSIS — E871 Hypo-osmolality and hyponatremia: Secondary | ICD-10-CM | POA: Diagnosis not present

## 2024-04-20 DIAGNOSIS — E1322 Other specified diabetes mellitus with diabetic chronic kidney disease: Secondary | ICD-10-CM | POA: Diagnosis not present

## 2024-04-20 DIAGNOSIS — Z66 Do not resuscitate: Secondary | ICD-10-CM | POA: Diagnosis not present

## 2024-04-20 LAB — GLUCOSE, CAPILLARY
Glucose-Capillary: 278 mg/dL — ABNORMAL HIGH (ref 70–99)
Glucose-Capillary: 354 mg/dL — ABNORMAL HIGH (ref 70–99)
Glucose-Capillary: 378 mg/dL — ABNORMAL HIGH (ref 70–99)
Glucose-Capillary: 222 mg/dL — ABNORMAL HIGH (ref 70–99)
Glucose-Capillary: 179 mg/dL — ABNORMAL HIGH (ref 70–99)

## 2024-04-20 MED ORDER — DILTIAZEM HCL ER COATED BEADS 240 MG PO CP24
240.0000 mg | ORAL_CAPSULE | Freq: Every day | ORAL | Status: DC
  Administered 2024-04-21: 240 mg via ORAL
  Filled 2024-04-20: qty 1

## 2024-04-20 MED ORDER — INSULIN GLARGINE-YFGN 100 UNIT/ML ~~LOC~~ SOLN
5.0000 [IU] | Freq: Every day | SUBCUTANEOUS | Status: DC
  Administered 2024-04-20 – 2024-04-21 (×2): 5 [IU] via SUBCUTANEOUS
  Filled 2024-04-20 (×3): qty 0.05

## 2024-04-20 NOTE — Plan of Care (Signed)

## 2024-04-20 NOTE — Progress Notes (Signed)
 Patient BP 220/110 at 0315, asymptomatic, slightly confused.  Labetalol  10 mg IV given, recheck BP 200/100 (manually for both pressures).  Dr Noralee made aware, order received to give AM BP meds now.  This has been completed, will document 0600 BP.

## 2024-04-20 NOTE — Plan of Care (Signed)
   Problem: Education: Goal: Knowledge of General Education information will improve Description: Including pain rating scale, medication(s)/side effects and non-pharmacologic comfort measures Outcome: Progressing   Problem: Clinical Measurements: Goal: Ability to maintain clinical measurements within normal limits will improve Outcome: Progressing Goal: Diagnostic test results will improve Outcome: Progressing

## 2024-04-20 NOTE — Progress Notes (Signed)
 PROGRESS NOTE   Emily Jennings  FMW:[id number] DOB: Jun 24, 1934 DOA: 04/07/2024 PCP: Antonetta Rollene BRAVO, MD   Chief Complaint  Patient presents with   Shortness of Breath   Level of care: Telemetry  Brief Admission History:  89 y.o. female with medical history significant for CHF, diabetes mellitus, hypertension, CKD 4.  Patient presented to the ED with complaints of difficulty breathing over the past 2 days, swelling to bilateral lower extremities up to her thighs, swelling of her arms.  She reports weight gain from 128-135 today.  She reports compliance with her medication-torsemide  60/40mg . Torsemide  dose was increased 7/15.   Recent hospitalizations for same-most recent 6/23 to 6/25.   ED Course: Temperature 92.8.  Heart rate 56-64.  Respirate rate 11-25.  Blood pressure systolic 143-174.  O2 sat greater than 95% on room air.  BNP 962.  Troponin 6 > 7.  Chest x-ray small bilateral pleural effusions.  Lasix  60 mg x 1 given   Assessment and Plan:  Decompensated bradycardia and hypotension -on 04/13/2024 around 1830- RESOLVED - Patient's heart rate was around 40, blood pressure 77/38 - RESOLVED  - Patient was given atropine  x 1, midodrine  10 mg p.o. x 1, 500 mL of bolus saline - Was transferred to ICU overnight for close observation, now she is back on telemetry unit  -Patient received diltiazem  90 mg x 1 on 04/13/2024    Acute HFpEF  Question of cardiorenal syndrome - Progressive heart failure-  - Developing progressive cardiorenal syndrome - Cardiology and nephrology signed off   -During this admission patient has been treated aggressively with diuretics  Lasix  120 mg every 8 hours Till 04/12/24  switched to Demadex  40 mg twice daily on 7/27 Back to IV Lasix  40 mg twice daily started 04/14/2024, increased to 120 mg BID by nephrologist on  7/31: nephrology team started torsemide  40 mg daily    Filed Weights   04/18/24 0525 04/19/24 0420 04/20/24 0331  Weight: 55 kg 55.1 kg 56.3 kg     Intake/Output Summary (Last 24 hours) at 04/20/2024 0925 Last data filed at 04/20/2024 0334 Gross per 24 hour  Intake 960 ml  Output 850 ml  Net 110 ml    -Following daily weights, strict I's and O's, Reds clip measurements and low-sodium diet. - Recent 2D echo demonstrating ejection fraction 70 to 75% with grade 2 diastolic dysfunction (no need to be repeated).   - Improved anasarca, improved orthopnea - with no further medical options available plan is for patient to return home tomorrow with [facility] per patient request with goal of eventually accepting palliative care services.     Hypertensive crisis -Persistent/resistant hypertension Was on clonidine /hydralazine /labetalol /Cardizem -was discontinued 04/13/2024 - now she is on hydralazine , diltiazem , imdur , torsemide , clonidine    Acute Encephalopathy - agree with nephrologist, ordered MRI brain to rule out PRES, CVA - no acute findings   Type 2 diabetes mellitus with nephropathy - A1c 7.4 - CBG q. ACHS, SSI coverage - increasing semglee  to 6 units daily with plan to slowly titrate doses  - added renal dose prandial coverage novolog  (3 units TID with meals eaten>50%)  CBG (last 3)  Recent Labs    04/19/24 2039 04/20/24 0330 04/20/24 0717  GLUCAP 215* 278* 354*   Chronic kidney disease stage IV - Monitoring closely -Nephrology following-no mass dialysis -Continue to follow renal function trend stability especially with ongoing IV diuresis. - Follow electrolytes and replete them as needed. Recent Labs       Lab Results  Component Value Date    CREATININE 2.83 (H) 04/14/2024    CREATININE 2.60 (H) 04/13/2024    CREATININE 2.59 (H) 04/12/2024    Nephrology -appreciate follow-up and recommendations, now signed off -if renal function worsens per goals of care discussions would transition to full comfort care     Hyponatremia - RESOLVED  - Much improved with diuresis - Continue treatment with diuretics - Follow electrolytes  trend - Patient asymptomatic. - Serum sodium level: 123, 123, 125, 126>>132>136 - Fluid restriction 1200 mL per day   Hypokalemia/hypomagnesemia K 3.2, 2.8, 2.5,>>> 3.9  -Mag 2.2 -repleted   Anemia of chronic disease due to chronic kidney disease -Monitoring H&H closely - Follow-up with iron  studies - Nephrology added Aranesp  60 mg q. Weekly   - Due to cardiorenal syndrome, hypotension - 04/14/2024 transfused 1U PRBC   DVT prophylaxis: sq  heparin  Code Status: DNR  Family Communication:  Disposition: anticipating home with HH in next 1-2 days, family declines SNF at this time   Consultants:  Nephrology Cardiology - signed off  Procedures:   Antimicrobials:    Subjective: Pt reports her legs seem less swollen today, denies headache, SOB and CP.     Objective: Vitals:   04/20/24 0331 04/20/24 0335 04/20/24 0358 04/20/24 0634  BP:  (!) 220/110 (!) 200/100 (!) 170/86  Pulse: 92     Resp: 14     Temp: 98.4 F (36.9 C)     TempSrc: Oral     SpO2: 99%     Weight: 56.3 kg     Height:        Intake/Output Summary (Last 24 hours) at 04/20/2024 0925 Last data filed at 04/20/2024 0334 Gross per 24 hour  Intake 960 ml  Output 850 ml  Net 110 ml   Filed Weights   04/18/24 0525 04/19/24 0420 04/20/24 0331  Weight: 55 kg 55.1 kg 56.3 kg   Examination:  General exam: frail, elderly female, severely deconditioned. Calm, NAD.    Respiratory system: Clear to auscultation. Respiratory effort normal. Cardiovascular system: normal S1 & S2 heard. No JVD, murmurs, rubs, gallops or clicks. No pedal edema. Gastrointestinal system: Abdomen is nondistended, soft and nontender. No organomegaly or masses felt. Normal bowel sounds heard. Central nervous system:  No focal neurological deficits. Extremities: persistent peripheral edema with some skin wrinkling and mild improvement in edema seen. Symmetric 5 x 5 power. Skin: No rashes, lesions or ulcers. Psychiatry: Judgement and insight  appear diminished. Mood & affect normal.   Data Reviewed: I have personally reviewed following labs and imaging studies  CBC: Recent Labs  Lab 04/15/24 0458 04/16/24 0418 04/17/24 0433 04/18/24 0453 04/19/24 0609  WBC 5.1 5.2 5.5 6.3 7.0  HGB 9.9* 10.5* 9.6* 9.5* 8.9*  HCT 29.8* 31.9* 28.3* 28.1* 27.0*  MCV 92.0 93.3 93.7 96.2 97.5  PLT 120* 119* 116* 103* 98*    Basic Metabolic Panel: Recent Labs  Lab 04/15/24 0458 04/16/24 0418 04/17/24 0433 04/18/24 0453 04/19/24 0609  NA 132* 133* 132* 135 136  K 3.0* 2.9* 3.1* 3.5 3.8  CL 85* 87* 90* 90* 92*  CO2 36* 30 30 29 31   GLUCOSE 90 190* 261* 220* 153*  BUN 68* 72* 80* 80* 82*  CREATININE 2.60* 2.68* 2.94* 3.01* 3.10*  CALCIUM  9.2 9.2 8.8* 8.8* 8.6*  MG  --   --   --  2.2  --   PHOS  --   --   --  4.0 3.9  CBG: Recent Labs  Lab 04/19/24 1114 04/19/24 1626 04/19/24 2039 04/20/24 0330 04/20/24 0717  GLUCAP 326* 278* 215* 278* 354*   No results found for this or any previous visit (from the past 240 hours).   Radiology Studies: No results found.  Scheduled Meds:  Chlorhexidine  Gluconate Cloth  6 each Topical Daily   cloNIDine   0.2 mg Oral TID   darbepoetin (ARANESP ) injection - DIALYSIS  60 mcg Subcutaneous Q Wed-1800   [START ON 04/21/2024] diltiazem   240 mg Oral Daily   diltiazem   60 mg Oral Q6H   feeding supplement  237 mL Oral BID BM   heparin  injection (subcutaneous)  5,000 Units Subcutaneous Q8H   hydrALAZINE   100 mg Oral Q8H   insulin  aspart  0-9 Units Subcutaneous TID WC   insulin  aspart  2 Units Subcutaneous TID WC   insulin  glargine-yfgn  5 Units Subcutaneous Daily   isosorbide  mononitrate  60 mg Oral Daily   potassium chloride   20 mEq Oral Daily   torsemide   40 mg Oral Daily   Continuous Infusions:   LOS: 13 days   Time spent: 55 mins  Kathleen Likins Vicci, MD How to contact the [facility] Attending or Consulting provider 7A - 7P or covering provider during after hours 7P -7A, for this patient?   Check the care team in [facility] and look for a) attending/consulting TRH provider listed and b) the TRH team listed Log into [email or web address] to find provider on call.  Locate the TRH provider you are looking for under [facility] and page to a number that you can be directly reached. If you still have difficulty reaching the provider, please page the [facility] (Director on Call) for the Hospitalists listed on amion for assistance.  04/20/2024, 9:25 AM

## 2024-04-21 DIAGNOSIS — I10 Essential (primary) hypertension: Secondary | ICD-10-CM | POA: Diagnosis not present

## 2024-04-21 DIAGNOSIS — Z515 Encounter for palliative care: Secondary | ICD-10-CM

## 2024-04-21 DIAGNOSIS — I509 Heart failure, unspecified: Secondary | ICD-10-CM | POA: Diagnosis not present

## 2024-04-21 DIAGNOSIS — I5033 Acute on chronic diastolic (congestive) heart failure: Secondary | ICD-10-CM | POA: Diagnosis not present

## 2024-04-21 DIAGNOSIS — Z66 Do not resuscitate: Secondary | ICD-10-CM | POA: Diagnosis not present

## 2024-04-21 DIAGNOSIS — E1122 Type 2 diabetes mellitus with diabetic chronic kidney disease: Secondary | ICD-10-CM | POA: Diagnosis not present

## 2024-04-21 LAB — RENAL FUNCTION PANEL
Albumin: 2.6 g/dL — ABNORMAL LOW (ref 3.5–5.0)
Anion gap: 11 (ref 5–15)
BUN: 91 mg/dL — ABNORMAL HIGH (ref 8–23)
CO2: 30 mmol/L (ref 22–32)
Calcium: 8.7 mg/dL — ABNORMAL LOW (ref 8.9–10.3)
Chloride: 94 mmol/L — ABNORMAL LOW (ref 98–111)
Creatinine, Ser: 2.99 mg/dL — ABNORMAL HIGH (ref 0.44–1.00)
GFR, Estimated: 15 mL/min — ABNORMAL LOW (ref >60)
Glucose, Bld: 112 mg/dL — ABNORMAL HIGH (ref 70–99)
Phosphorus: 3.9 mg/dL (ref 2.5–4.6)
Potassium: 3.9 mmol/L (ref 3.5–5.1)
Sodium: 135 mmol/L (ref 135–145)

## 2024-04-21 LAB — GLUCOSE, CAPILLARY
Glucose-Capillary: 109 mg/dL — ABNORMAL HIGH (ref 70–99)
Glucose-Capillary: 215 mg/dL — ABNORMAL HIGH (ref 70–99)
Glucose-Capillary: 238 mg/dL — ABNORMAL HIGH (ref 70–99)

## 2024-04-21 MED ORDER — TORSEMIDE 20 MG PO TABS: 40.0000 mg | ORAL_TABLET | Freq: Every day | ORAL | Status: DC

## 2024-04-21 MED ORDER — DILTIAZEM HCL ER COATED BEADS 240 MG PO CP24: 240.0000 mg | ORAL_CAPSULE | Freq: Every day | ORAL | 1 refills | Status: DC

## 2024-04-21 MED ORDER — COLLAGENASE 250 UNIT/GM EX OINT
TOPICAL_OINTMENT | Freq: Every day | CUTANEOUS | Status: DC
  Filled 2024-04-21: qty 30

## 2024-04-21 MED ORDER — CLONIDINE HCL 0.3 MG PO TABS: 0.3000 mg | ORAL_TABLET | Freq: Three times a day (TID) | ORAL | Status: DC

## 2024-04-21 NOTE — Discharge Instructions (Addendum)
 Sacral Wound Care Instructions:   Cleanse buttock wound with saline, pat dry Cover buttock wound with single layer of xeroform gauze, top with foam. Change xeroform daily, ok to lift foam and change foam every 3 days if not soiled.    2. Cleanse sacral wound with saline, pat dry Cover with layer of Santyl , top with saline damp 2x2 gauze, top with dry dressing. Change daily    3. Low air loss mattress for moisture management and pressure redistribution 4. Consult RD for wound supplements.  5. Chair pressure redistribution pad if up in chair.    IMPORTANT INFORMATION: PAY CLOSE ATTENTION   PHYSICIAN DISCHARGE INSTRUCTIONS  Follow with Primary care provider  Antonetta Rollene BRAVO, MD  and other consultants as instructed by your Hospitalist Physician  SEEK MEDICAL CARE OR RETURN TO EMERGENCY ROOM IF SYMPTOMS COME BACK, WORSEN OR NEW PROBLEM DEVELOPS   Please note: You were cared for by a hospitalist during your hospital stay. Every effort will be made to forward records to your primary care provider.  You can request that your primary care provider send for your hospital records if they have not received them.  Once you are discharged, your primary care physician will handle any further medical issues. Please note that NO REFILLS for any discharge medications will be authorized once you are discharged, as it is imperative that you return to your primary care physician (or establish a relationship with a primary care physician if you do not have one) for your post hospital discharge needs so that they can reassess your need for medications and monitor your lab values.  Please get a complete blood count and chemistry panel checked by your Primary MD at your next visit, and again as instructed by your Primary MD.  Get Medicines reviewed and adjusted: Please take all your medications with you for your next visit with your Primary MD  Laboratory/radiological data: Please request your Primary MD to  go over all hospital tests and procedure/radiological results at the follow up, please ask your primary care provider to get all Hospital records sent to his/her office.  In some cases, they will be blood work, cultures and biopsy results pending at the time of your discharge. Please request that your primary care provider follow up on these results.  If you are diabetic, please bring your blood sugar readings with you to your follow up appointment with primary care.    Please call and make your follow up appointments as soon as possible.    Also Note the following: If you experience worsening of your admission symptoms, develop shortness of breath, life threatening emergency, suicidal or homicidal thoughts you must seek medical attention immediately by calling 911 or calling your MD immediately  if symptoms less severe.  You must read complete instructions/literature along with all the possible adverse reactions/side effects for all the Medicines you take and that have been prescribed to you. Take any new Medicines after you have completely understood and accpet all the possible adverse reactions/side effects.   Do not drive when taking Pain medications or sleeping medications (Benzodiazepines)  Do not take more than prescribed Pain, Sleep and Anxiety Medications. It is not advisable to combine anxiety,sleep and pain medications without talking with your primary care practitioner  Special Instructions: If you have smoked or chewed Tobacco  in the last 2 yrs please stop smoking, stop any regular Alcohol  and or any Recreational drug use.  Wear Seat belts while driving.  Do not  drive if taking any narcotic, mind altering or controlled substances or recreational drugs or alcohol.

## 2024-04-21 NOTE — Plan of Care (Signed)

## 2024-04-21 NOTE — Consult Note (Signed)
 WOC Nurse Consult Note: Reason for Consult: sacral wound Wound type: Sacral: Unstageable Pressure Injury Right buttock: Deep Tissue Pressure Injury Pressure Injury POA: No Measurement:see nursing flow sheets Wound bed: Sacrum; 90% black/yellow/10% pink at 12 o'clock Right buttock: 100% dark purple; non blanchable; in evolution with blister formation  Drainage (amount, consistency, odor) see nursing flow sheets Periwound: intact, discoloration of bilateral buttocks at the time of admission normal for skin tone Dressing procedure/placement/frequency:  Cleanse buttock wound with saline, pat dry Cover buttock wound with single layer of xeroform gauze, top with foam. Change xeroform daily, ok to lift foam and change foam every 3 days if not soiled.   2. Cleanse sacral wound with saline, pat dry  Cover with layer of Santyl , top with saline damp 2x2 gauze, top with dry dressing.  Change daily   3. Low air loss mattress for moisture management and pressure redistribution 4. Consult RD for wound supplements.  5. Chair pressure redistribution pad if up in chair.   WOC Nurse team will follow with you and see patient within 10 days for wound assessments.  Please notify WOC nurses of any acute changes in the wounds or any new areas of concern Inara Dike [facility] MSN, RN,CWOCN, CNS, CWON-AP [phone]

## 2024-04-21 NOTE — Progress Notes (Signed)
 Daily Progress Note   Patient Name: Emily Jennings       Date: 04/21/2024 DOB: [birth year]  Age: 89 y.o. MRN#: [id number] Attending Physician: Vicci Afton CROME, MD Primary Care Physician: Antonetta Rollene BRAVO, MD Admit Date: 04/07/2024  Reason for Consultation/Follow-up: Establishing goals of care  Subjective: 89 y.o. female with medical history significant for CHF, diabetes mellitus, hypertension, CKD 4.    Patient was admitted to the hospital for acute on chronic CHF exacerbation with volume overload.  She was treated with aggressive diuretics.  Treatment course complicated by her acute on chronic CKD.    Palliative was consulted.  Patient indicated she was not ready for hospice.  She did verbalize her desire to be DNR/DNI and not to undergo dialysis.  She was uncertain about having palliative follow her as an outpatient.  She was resistant to SNF discharge.  She preferred to return home with family support.  Today, patient states she is tired.  She does report less shortness of breath.  She denies any pain.  She indicates that her goal and desire is to return home.  Chart review/care coordination:  Completed extensive chart review including EPIC notes, labs, prior hospital encounters, intake and output, and vital signs.    Length of Stay: 14   Physical Exam Constitutional:      General: She is not in acute distress. Pulmonary:     Effort: Pulmonary effort is normal. No tachypnea or accessory muscle usage.  Skin:    General: Skin is warm and dry.  Neurological:     Comments: Appears fatigued but arousable             Vital Signs: BP (!) 220/83 (BP Location: Right Arm)   Pulse 80   Temp 98.1 F (36.7 C) (Oral)   Resp 18   Ht 5' (1.524 m)   Wt 60.4 kg   SpO2 99%   BMI 26.01 kg/m  SpO2: SpO2: 99 % O2 Device: O2 Device:  Nasal Cannula O2 Flow Rate: O2 Flow Rate (L/min): 2 L/min      Palliative Assessment/Data: 50%   Palliative Care Assessment & Plan   Patient Profile/Assessment:  89 year old female with multiple chronic medical problems admitted to the hospital for acute on chronic CHF.  Underwent aggressive diuresis.  Attempt to optimize fluid status were made but limited by her CKD stage IV.  She does not desire dialysis.  She wants to return home with family.  Reports some improvement in her breathing.  She is hesitant to consider outpatient palliative support.  Declines hospice support.  I did attempt to contact patient's  husband to discuss outpatient palliative referral.  No answer.  Recommendations/Plan: DNR/DNI Encouraged ongoing goals of care discussion as an outpatient and highly recommend outpatient palliative follow-up  Discharge home with home health per patient's wish   Symptom management:  Symptoms well managed at present  Prognosis:  guarded    Discharge Planning: Home with Home Health    Detailed review of medical records (labs, imaging, vital signs), medically appropriate exam, discussed with treatment team, counseling and education to patient, family, & staff, documenting clinical information, medication management, coordination of care   Total time: I spent 40 minutes in the care of the patient today in the above activities and documenting the encounter.   Laymon CHRISTELLA Pinal, NP  Palliative Medicine Team Team phone # [phone]  Thank you for allowing the Palliative Medicine Team to assist in the care of this patient. Please utilize secure chat with additional questions, if there is no response within 30 minutes please call the above phone number.  Palliative Medicine Team providers are available by phone from 7am to 7pm daily and can be reached through the team cell phone.  Should this patient require assistance outside of these hours, please call the patient's  attending physician.

## 2024-04-21 NOTE — Discharge Summary (Signed)
 Physician Discharge Summary  Emily Jennings FMW:[id number] DOB: [birth year] DOA: 04/07/2024  PCP: Antonetta Rollene BRAVO, MD  Admit date: 04/07/2024 Discharge date: 04/21/2024  Admitted From:  Home with HH  Disposition: Home with HH (declined SNF and palliative)  Recommendations for Outpatient Follow-up:  Follow up with PCP in 1 weeks Please obtain BMP in one week From Palliative consult goals of care discussions: no hemodialysis, if renal function worsens proceed with full comfort measures, ok to continue diuresis with torsemide  as long as it is still working  Please work with family to consider an outpatient palliative medicine consultation as patient is approaching ESRD and declining dialysis.   Home Health: PT/RN/SW (declined SNF and palliative which was strongly recommended)  Sacral Wound Care Instructions Cleanse buttock wound with saline, pat dry Cover buttock wound with single layer of xeroform gauze, top with foam. Change xeroform daily, ok to lift foam and change foam every 3 days if not soiled.    2. Cleanse sacral wound with saline, pat dry  Cover with layer of Santyl , top with saline damp 2x2 gauze, top with dry dressing.  Change daily    3. Low air loss mattress for moisture management and pressure redistribution 4. Consult RD for wound supplements.  5. Chair pressure redistribution pad if up in chair.   Discharge Condition: Guarded  CODE STATUS: DNR  DIET: low sodium foods recommended   Brief Hospitalization Summary: Please see all hospital notes, images, labs for full details of the hospitalization. Admission provider HPI:  89 y.o. female with medical history significant for CHF, diabetes mellitus, hypertension, CKD 4.  Patient presented to the ED with complaints of difficulty breathing over the past 2 days, swelling to bilateral lower extremities up to her thighs, swelling of her arms.  She reports weight gain from 128-135 today.  She reports compliance with her  medication-torsemide  60/40mg . Torsemide  dose was increased 7/15.   Recent hospitalizations for same-most recent 6/23 to 6/25.   ED Course: Temperature 92.8.  Heart rate 56-64.  Respirate rate 11-25.  Blood pressure systolic 143-174.  O2 sat greater than 95% on room air.  BNP 962.  Troponin 6 > 7.  Chest x-ray small bilateral pleural effusions.  Lasix  60 mg x 1 given  Hospital Course by listed problems addressed  Decompensated bradycardia and hypotension -on 04/13/2024 around 1830- RESOLVED - Patient's heart rate was around 40, blood pressure 77/38 - RESOLVED  - Patient was given atropine  x 1, midodrine  10 mg p.o. x 1, 500 mL of bolus saline - Was transferred to ICU overnight for close observation, then back to telemetry unit    Acute HFpEF  Question of cardiorenal syndrome - Progressive heart failure-  - Developing progressive cardiorenal syndrome - Cardiology and nephrology signed off - she remains on torsemide  40 mg daily which she is tolerating   -During this admission patient was treated aggressively with diuretics per nephrology team Lasix  120 mg every 8 hours Till 04/12/24  switched to Demadex  40 mg twice daily on 7/27 Back to IV Lasix  40 mg twice daily started 04/14/2024, increased to 120 mg BID by nephrologist on  7/31: nephrology team started torsemide  40 mg daily    Filed Weights   04/19/24 0420 04/20/24 0331 04/20/24 2311  Weight: 55.1 kg 56.3 kg 60.4 kg    Intake/Output Summary (Last 24 hours) at 04/21/2024 1321 Last data filed at 04/21/2024 0500 Gross per 24 hour  Intake 240 ml  Output 650 ml  Net -410 ml   -  Following daily weights, strict I's and O's, Reds clip measurements and low-sodium diet. - Recent 2D echo demonstrating ejection fraction 70 to 75% with grade 2 diastolic dysfunction (no need to be repeated).   - Improved anasarca, improved orthopnea - with no further medical options available plan is for patient to return home tomorrow with [facility] per patient request  with goal of eventually accepting palliative care services.     Hypertensive crisis -Persistent/resistant hypertension Was on clonidine /hydralazine /labetalol /Cardizem -was discontinued 04/13/2024 - now she is on hydralazine , diltiazem , imdur , torsemide , clonidine    Acute Encephalopathy - agree with nephrologist, ordered MRI brain to rule out PRES, CVA - no acute findings   Type 2 diabetes mellitus with nephropathy - A1c 7.4 - CBG q. ACHS, SSI coverage - increasing semglee  to 6 units daily with plan to slowly titrate doses  - added renal dose prandial coverage novolog  (3 units TID with meals eaten>50%) CBG (last 3)  Recent Labs    04/21/24 0325 04/21/24 0801 04/21/24 1125  GLUCAP 109* 215* 238*    Chronic kidney disease stage IV - Monitoring closely -Nephrology following-no mass dialysis -Continued to follow renal function trend stability especially with ongoing IV diuresis. - Followed electrolytes and replete them as needed. Nephrology -appreciate follow-up and recommendations, now signed off -if renal function worsens per goals of care discussions would transition to full comfort care     Hyponatremia - RESOLVED  - Much improved with diuresis - Continue treatment with diuretics - Follow electrolytes trend - Patient asymptomatic. - Serum sodium level: 123, 123, 125, 126>>132>136 - Fluid restriction 1200 mL per day   Hypokalemia/hypomagnesemia K 3.2, 2.8, 2.5,>>> 3.9  -Mag 2.2 -repleted   Anemia of chronic disease due to chronic kidney disease -Monitoring H&H closely - Follow-up with iron  studies - Nephrology added Aranesp  60 mg q. Weekly   - Due to cardiorenal syndrome, hypotension - 04/14/2024 transfused 1U PRBC   Sacral Pressure Wound -- unstageable  -- see WOC RN wound care recommendations above -- HH RN ordered to assist with wound care at home -- ambulatory referral made to outpatient wound care center  Sacral Wound Care Instructions Cleanse buttock wound  with saline, pat dry Cover buttock wound with single layer of xeroform gauze, top with foam. Change xeroform daily, ok to lift foam and change foam every 3 days if not soiled.    2. Cleanse sacral wound with saline, pat dry  Cover with layer of Santyl , top with saline damp 2x2 gauze, top with dry dressing.  Change daily    3. Low air loss mattress for moisture management and pressure redistribution 4. Consult RD for wound supplements.  5. Chair pressure redistribution pad if up in chair.   Discharge Diagnoses:  Principal Problem:   Acute on chronic congestive heart failure with left ventricular diastolic dysfunction (HCC) Active Problems:   Diabetes (HCC)   CKD stage 4 due to type 2 diabetes mellitus (HCC)   Hypothermia   Hyponatremia   DNR (do not resuscitate)   Essential hypertension   Discharge Instructions: Discharge Instructions     AMB referral to wound care center   Complete by: As directed       Allergies as of 04/21/2024       Reactions   Farxiga [dapagliflozin] Other (See Comments)   DKA   Metformin  And Related Diarrhea   Lost appetite and weight    Benadryl [diphenhydramine Hcl] Hypertension   Citalopram Other (See Comments)   Unknown    Tramadol  Other (  See Comments)   Felt light headed and dizzy   Amlodipine  Swelling   Crestor  [rosuvastatin ] Swelling, Other (See Comments)   Feet swelling Makes her feel weak. Pt takes 5mg  at home        Medication List     STOP taking these medications    carvedilol  12.5 MG tablet Commonly known as: COREG    NIFEdipine  60 MG 24 hr tablet Commonly known as: PROCARDIA  XL/NIFEDICAL XL   Potassium Chloride  ER 20 MEQ Tbcr       TAKE these medications    albuterol  108 (90 Base) MCG/ACT inhaler Commonly known as: VENTOLIN  HFA Inhale 2 puffs into the lungs every 2 (two) hours as needed for wheezing or shortness of breath.   cloNIDine  0.3 MG tablet Commonly known as: CATAPRES  Take 1 tablet (0.3 mg total) by  mouth 3 (three) times daily. What changed: when to take this   diltiazem  240 MG 24 hr capsule Commonly known as: CARDIZEM  CD Take 1 capsule (240 mg total) by mouth daily. Start taking on: April 22, 2024   hydrALAZINE  100 MG tablet Commonly known as: APRESOLINE  Take 1 tablet (100 mg total) by mouth 3 (three) times daily.   insulin  degludec 200 UNIT/ML FlexTouch Pen Commonly known as: TRESIBA  Inject 6 Units into the skin daily at 10 pm.   insulin  lispro 100 UNIT/ML KwikPen Commonly known as: HumaLOG  KwikPen Inject 0-12 Units into the skin 3 (three) times daily. Short acting Humalog  insulin  per sliding scale 0-12 ---:-- Insulin  injection 0-12 Units 0-12 Units Subcutaneous, 3 times daily with meals CBG 70 - 120: 0 unit CBG 121 - 150: 0 unit  CBG 151 - 200: 2 unit CBG 201 - 250: 4 units CBG 251 - 300: 6 units CBG 301 - 350: 8 units  CBG 351 - 400: 10 units  CBG > 400: 12 units   isosorbide  mononitrate 60 MG 24 hr tablet Commonly known as: IMDUR  Take 1 tablet (60 mg total) by mouth daily.   magnesium  oxide 400 (240 Mg) MG tablet Commonly known as: MAG-OX Take 1 tablet (400 mg total) by mouth daily.   Restasis  0.05 % ophthalmic emulsion Generic drug: cycloSPORINE  Place 1 drop into both eyes 2 (two) times daily.   rosuvastatin  5 MG tablet Commonly known as: CRESTOR  Take 1 tablet (5 mg total) by mouth daily.   torsemide  20 MG tablet Commonly known as: DEMADEX  Take 2 tablets (40 mg total) by mouth daily. What changed:  how much to take how to take this when to take this additional instructions        Follow-up Information     Antonetta Rollene BRAVO, MD. Schedule an appointment as soon as possible for a visit in 1 week(s).   Specialty: Family Medicine Why: Hospital Follow Up Contact information: [address], [address] [place] [address] [phone]                Allergies  Allergen Reactions   Farxiga [Dapagliflozin] Other (See Comments)    DKA    Metformin  And Related Diarrhea    Lost appetite and weight    Benadryl [Diphenhydramine Hcl] Hypertension   Citalopram Other (See Comments)    Unknown    Tramadol  Other (See Comments)    Felt light headed and dizzy   Amlodipine  Swelling   Crestor  [Rosuvastatin ] Swelling and Other (See Comments)    Feet swelling Makes her feel weak. Pt takes 5mg  at home   Allergies as of 04/21/2024  Reactions   Farxiga [dapagliflozin] Other (See Comments)   DKA   Metformin  And Related Diarrhea   Lost appetite and weight    Benadryl [diphenhydramine Hcl] Hypertension   Citalopram Other (See Comments)   Unknown    Tramadol  Other (See Comments)   Felt light headed and dizzy   Amlodipine  Swelling   Crestor  [rosuvastatin ] Swelling, Other (See Comments)   Feet swelling Makes her feel weak. Pt takes 5mg  at home        Medication List     STOP taking these medications    carvedilol  12.5 MG tablet Commonly known as: COREG    NIFEdipine  60 MG 24 hr tablet Commonly known as: PROCARDIA  XL/NIFEDICAL XL   Potassium Chloride  ER 20 MEQ Tbcr       TAKE these medications    albuterol  108 (90 Base) MCG/ACT inhaler Commonly known as: VENTOLIN  HFA Inhale 2 puffs into the lungs every 2 (two) hours as needed for wheezing or shortness of breath.   cloNIDine  0.3 MG tablet Commonly known as: CATAPRES  Take 1 tablet (0.3 mg total) by mouth 3 (three) times daily. What changed: when to take this   diltiazem  240 MG 24 hr capsule Commonly known as: CARDIZEM  CD Take 1 capsule (240 mg total) by mouth daily. Start taking on: April 22, 2024   hydrALAZINE  100 MG tablet Commonly known as: APRESOLINE  Take 1 tablet (100 mg total) by mouth 3 (three) times daily.   insulin  degludec 200 UNIT/ML FlexTouch Pen Commonly known as: TRESIBA  Inject 6 Units into the skin daily at 10 pm.   insulin  lispro 100 UNIT/ML KwikPen Commonly known as: HumaLOG  KwikPen Inject 0-12 Units into the skin 3 (three) times  daily. Short acting Humalog  insulin  per sliding scale 0-12 ---:-- Insulin  injection 0-12 Units 0-12 Units Subcutaneous, 3 times daily with meals CBG 70 - 120: 0 unit CBG 121 - 150: 0 unit  CBG 151 - 200: 2 unit CBG 201 - 250: 4 units CBG 251 - 300: 6 units CBG 301 - 350: 8 units  CBG 351 - 400: 10 units  CBG > 400: 12 units   isosorbide  mononitrate 60 MG 24 hr tablet Commonly known as: IMDUR  Take 1 tablet (60 mg total) by mouth daily.   magnesium  oxide 400 (240 Mg) MG tablet Commonly known as: MAG-OX Take 1 tablet (400 mg total) by mouth daily.   Restasis  0.05 % ophthalmic emulsion Generic drug: cycloSPORINE  Place 1 drop into both eyes 2 (two) times daily.   rosuvastatin  5 MG tablet Commonly known as: CRESTOR  Take 1 tablet (5 mg total) by mouth daily.   torsemide  20 MG tablet Commonly known as: DEMADEX  Take 2 tablets (40 mg total) by mouth daily. What changed:  how much to take how to take this when to take this additional instructions        Procedures/Studies: MR BRAIN WO CONTRAST Result Date: 04/16/2024 CLINICAL DATA:  Mental status change, unknown cause EXAM: MRI HEAD WITHOUT CONTRAST TECHNIQUE: Multiplanar, multiecho pulse sequences of the brain and surrounding structures were obtained without intravenous contrast. COMPARISON:  CT head 05/20/2023. FINDINGS: Brain: Hemosiderin and mild T2 hypointensity in the right cerebellum without edema or mass effect, compatible with the sequela of known prior hemorrhage. No evidence of acute hemorrhage, acute infarct, midline shift or hydrocephalus. Patchy T2/FLAIR hyperintensities the white matter, compatible with chronic microvascular ischemic disease. Cerebral atrophy. Vascular: Major arterial flow voids are maintained at the skull base. Skull and upper cervical spine: Normal marrow signal. Sinuses/Orbits:  Partially opacified right sphenoid sinus. No acute orbital findings. Other: No mastoid effusions. IMPRESSION: 1. No evidence of acute  intracranial abnormality. 2. Sequela of prior right cerebellar hemorrhage. 3. Chronic microvascular ischemic disease and cerebral atrophy (ICD10-.9). Electronically Signed   By: Gilmore GORMAN Molt M.D.   On: 04/16/2024 22:47   DG Chest Port 1 View Result Date: 04/07/2024 CLINICAL DATA:  Shortness of breath for several days. EXAM: PORTABLE CHEST 1 VIEW COMPARISON:  03/10/2024 FINDINGS: Stable mild cardiomegaly. Stable small bilateral pleural effusions. Both lungs are otherwise clear. IMPRESSION: Stable mild cardiomegaly and small bilateral pleural effusions. No new findings. Electronically Signed   By: Norleen DELENA Kil M.D.   On: 04/07/2024 15:43     Subjective: Pt agreeable to going home, says she understands that her kidney function continues to worsen but wants to be home.  Discharge Exam: Vitals:   04/20/24 2006 04/21/24 0604  BP: (!) 174/62 (!) 220/83  Pulse: 62 80  Resp: 17 18  Temp: 97.7 F (36.5 C) 98.1 F (36.7 C)  SpO2: 100% 99%   Vitals:   04/20/24 1331 04/20/24 2006 04/20/24 2311 04/21/24 0604  BP: (!) 160/70 (!) 174/62  (!) 220/83  Pulse: 75 62  80  Resp: 20 17  18   Temp: 99.8 F (37.7 C) 97.7 F (36.5 C)  98.1 F (36.7 C)  TempSrc: Axillary Oral  Oral  SpO2: 100% 100%  99%  Weight:   60.4 kg   Height:       General: Pt is alert, awake, not in acute distress Cardiovascular: normal S1/S2 +, no rubs, no gallops Respiratory: CTA bilaterally, no wheezing, no rhonchi Abdominal: Soft, NT, ND, bowel sounds + Extremities: no edema, no cyanosis Skin: sacral pressure wound unstageable    The results of significant diagnostics from this hospitalization (including imaging, microbiology, ancillary and laboratory) are listed below for reference.     Microbiology: No results found for this or any previous visit (from the past 240 hours).   Labs: BNP (last 3 results) Recent Labs    04/16/24 0418 04/17/24 0433 04/18/24 0453  BNP 2,765.0* 2,269.0* 2,036.0*   Basic  Metabolic Panel: Recent Labs  Lab 04/16/24 0418 04/17/24 0433 04/18/24 0453 04/19/24 0609 04/21/24 0430  NA 133* 132* 135 136 135  K 2.9* 3.1* 3.5 3.8 3.9  CL 87* 90* 90* 92* 94*  CO2 30 30 29 31 30   GLUCOSE 190* 261* 220* 153* 112*  BUN 72* 80* 80* 82* 91*  CREATININE 2.68* 2.94* 3.01* 3.10* 2.99*  CALCIUM  9.2 8.8* 8.8* 8.6* 8.7*  MG  --   --  2.2  --   --   PHOS  --   --  4.0 3.9 3.9   Liver Function Tests: Recent Labs  Lab 04/15/24 0458 04/16/24 0418 04/17/24 0433 04/18/24 0453 04/19/24 0609 04/21/24 0430  AST 41 33 25  --   --   --   ALT 29 26 21   --   --   --   ALKPHOS 89 83 79  --   --   --   BILITOT 0.8 0.5 1.0  --   --   --   PROT 5.5* 5.3* 5.2*  --   --   --   ALBUMIN  3.1* 3.0* 3.0* 2.8* 2.5* 2.6*   No results for input(s): LIPASE, AMYLASE in the last 168 hours. No results for input(s): AMMONIA in the last 168 hours. CBC: Recent Labs  Lab 04/15/24 0458 04/16/24 9581 04/17/24 9566 04/18/24 9546  04/19/24 0609  WBC 5.1 5.2 5.5 6.3 7.0  HGB 9.9* 10.5* 9.6* 9.5* 8.9*  HCT 29.8* 31.9* 28.3* 28.1* 27.0*  MCV 92.0 93.3 93.7 96.2 97.5  PLT 120* 119* 116* 103* 98*   Cardiac Enzymes: No results for input(s): CKTOTAL, CKMB, CKMBINDEX, TROPONINI in the last 168 hours. BNP: Invalid input(s): POCBNP CBG: Recent Labs  Lab 04/20/24 1641 04/20/24 2006 04/21/24 0325 04/21/24 0801 04/21/24 1125  GLUCAP 222* 179* 109* 215* 238*   D-Dimer No results for input(s): DDIMER in the last 72 hours. Hgb A1c No results for input(s): HGBA1C in the last 72 hours. Lipid Profile No results for input(s): CHOL, HDL, LDLCALC, TRIG, CHOLHDL, LDLDIRECT in the last 72 hours. Thyroid  function studies No results for input(s): TSH, T4TOTAL, T3FREE, THYROIDAB in the last 72 hours.  Invalid input(s): FREET3 Anemia work up No results for input(s): VITAMINB12, FOLATE, FERRITIN, TIBC, IRON , RETICCTPCT in the last 72  hours. Urinalysis    Component Value Date/Time   COLORURINE YELLOW 04/14/2024 1130   APPEARANCEUR CLEAR 04/14/2024 1130   LABSPEC 1.009 04/14/2024 1130   PHURINE 6.0 04/14/2024 1130   GLUCOSEU 150 (A) 04/14/2024 1130   HGBUR NEGATIVE 04/14/2024 1130   HGBUR trace-intact 04/28/2010 1259   BILIRUBINUR NEGATIVE 04/14/2024 1130   BILIRUBINUR neg 09/29/2013 1341   KETONESUR NEGATIVE 04/14/2024 1130   PROTEINUR 100 (A) 04/14/2024 1130   UROBILINOGEN 0.2 09/29/2013 1341   UROBILINOGEN 0.2 12/28/2010 1743   NITRITE NEGATIVE 04/14/2024 1130   LEUKOCYTESUR NEGATIVE 04/14/2024 1130   Sepsis Labs Recent Labs  Lab 04/16/24 0418 04/17/24 0433 04/18/24 0453 04/19/24 0609  WBC 5.2 5.5 6.3 7.0   Microbiology No results found for this or any previous visit (from the past 240 hours).  Time coordinating discharge: 40 mins  SIGNED:  Afton Louder, MD  [facility] 04/21/2024, 1:21 PM How to contact the [facility] Attending or Consulting provider 7A - 7P or covering provider during after hours 7P -7A, for this patient?  Check the care team in [facility] and look for a) attending/consulting [facility] provider listed and b) the TR[facility] team listed Log into [email or web address] and use [place]'s universal password to access. If you do not have the password, please contact the hospital operator. Locate the TR[facility] provider you are looking for under [facility] and page to a number that you can be directly reached. If you still have difficulty reaching the provider, please page the [facility] (Director on Call) for the Hospitalists listed on amion for assistance.

## 2024-04-22 ENCOUNTER — Other Ambulatory Visit: Payer: Self-pay

## 2024-04-22 ENCOUNTER — Telehealth: Payer: Self-pay

## 2024-04-22 DIAGNOSIS — E1122 Type 2 diabetes mellitus with diabetic chronic kidney disease: Secondary | ICD-10-CM

## 2024-04-22 MED ORDER — ACCU-CHEK GUIDE ME W/DEVICE KIT: 1.0000 | PACK | 0 refills | Status: DC

## 2024-04-22 MED ORDER — ACCU-CHEK GUIDE TEST VI STRP: 1.0000 | ORAL_STRIP | 2 refills | Status: DC | PRN

## 2024-04-22 MED ORDER — ACCU-CHEK SOFTCLIX LANCETS MISC: 1.0000 | 2 refills | Status: DC

## 2024-04-22 NOTE — Transitions of Care (Post Inpatient/ED Visit) (Signed)
   04/22/2024  Name: [name] MRN: [id number] DOB: 02-04-1934  Today's TOC FU Call Status: Today's TOC FU Call Status:: Unsuccessful Call (1st Attempt) Unsuccessful Call (1st Attempt) Date: 04/22/24  Attempted to reach the patient regarding the most recent Inpatient/ED visit.  Follow Up Plan: Additional outreach attempts will be made to reach the patient to complete the Transitions of Care (Post Inpatient/ED visit) call.   Alan Ee, RN, BSN, CEN [employer]- Transition of Care Team.  [facility] [phone]

## 2024-04-23 ENCOUNTER — Telehealth: Payer: Self-pay

## 2024-04-23 DIAGNOSIS — I2510 Atherosclerotic heart disease of native coronary artery without angina pectoris: Secondary | ICD-10-CM | POA: Diagnosis not present

## 2024-04-23 DIAGNOSIS — N184 Chronic kidney disease, stage 4 (severe): Secondary | ICD-10-CM | POA: Diagnosis not present

## 2024-04-23 DIAGNOSIS — E871 Hypo-osmolality and hyponatremia: Secondary | ICD-10-CM | POA: Diagnosis not present

## 2024-04-23 DIAGNOSIS — I4729 Other ventricular tachycardia: Secondary | ICD-10-CM | POA: Diagnosis not present

## 2024-04-23 DIAGNOSIS — R1310 Dysphagia, unspecified: Secondary | ICD-10-CM | POA: Diagnosis not present

## 2024-04-23 DIAGNOSIS — Z86000 Personal history of in-situ neoplasm of breast: Secondary | ICD-10-CM | POA: Diagnosis not present

## 2024-04-23 DIAGNOSIS — E876 Hypokalemia: Secondary | ICD-10-CM | POA: Diagnosis not present

## 2024-04-23 DIAGNOSIS — J9811 Atelectasis: Secondary | ICD-10-CM | POA: Diagnosis not present

## 2024-04-23 DIAGNOSIS — D631 Anemia in chronic kidney disease: Secondary | ICD-10-CM | POA: Diagnosis not present

## 2024-04-23 DIAGNOSIS — K219 Gastro-esophageal reflux disease without esophagitis: Secondary | ICD-10-CM | POA: Diagnosis not present

## 2024-04-23 DIAGNOSIS — M810 Age-related osteoporosis without current pathological fracture: Secondary | ICD-10-CM | POA: Diagnosis not present

## 2024-04-23 DIAGNOSIS — E861 Hypovolemia: Secondary | ICD-10-CM | POA: Diagnosis not present

## 2024-04-23 DIAGNOSIS — Z9181 History of falling: Secondary | ICD-10-CM | POA: Diagnosis not present

## 2024-04-23 DIAGNOSIS — I110 Hypertensive heart disease with heart failure: Secondary | ICD-10-CM | POA: Diagnosis not present

## 2024-04-23 DIAGNOSIS — Z794 Long term (current) use of insulin: Secondary | ICD-10-CM | POA: Diagnosis not present

## 2024-04-23 DIAGNOSIS — G4700 Insomnia, unspecified: Secondary | ICD-10-CM | POA: Diagnosis not present

## 2024-04-23 DIAGNOSIS — I5033 Acute on chronic diastolic (congestive) heart failure: Secondary | ICD-10-CM | POA: Diagnosis not present

## 2024-04-23 DIAGNOSIS — E1122 Type 2 diabetes mellitus with diabetic chronic kidney disease: Secondary | ICD-10-CM | POA: Diagnosis not present

## 2024-04-23 NOTE — Transitions of Care (Post Inpatient/ED Visit) (Signed)
   04/23/2024  Name: Emily Jennings MRN: [id number] DOB: 01/30/1934  Today's TOC FU Call Status: Today's TOC FU Call Status:: Unsuccessful Call (2nd Attempt) Unsuccessful Call (2nd Attempt) Date: 04/23/24  Attempted to reach the patient regarding the most recent Inpatient/ED visit.  Follow Up Plan: Additional outreach attempts will be made to reach the patient to complete the Transitions of Care (Post Inpatient/ED visit) call.   Alan Ee, RN, BSN, CEN [employer]- Transition of Care Team.  [facility] [phone]

## 2024-04-23 NOTE — Telephone Encounter (Signed)
 Copied from CRM #[id number]. Topic: Clinical - Medical Advice >> Apr 23, 2024  4:28 PM Sasha H wrote: Reason for CRM: Case Manager from [facility] is reuqesting that someone from the office call and schedule the pt a in office visit for a pressure wound on cheek

## 2024-04-23 NOTE — Telephone Encounter (Signed)
LVM to try and schedule patient

## 2024-04-23 NOTE — Telephone Encounter (Signed)
 Copied from CRM #[id number]. Topic: Clinical - Home Health Verbal Orders >> Apr 23, 2024  4:25 PM Sasha H wrote: Caller/Agency: Rawland Cella [facility] Callback Number: [phone] Service Requested: Skilled Nursing Frequency: 1 week 3 Any new concerns about the patient? Yes, pressure injury on right cheek that needs to be staged .[name] Wants to keep clean , wound dressing

## 2024-04-24 ENCOUNTER — Telehealth: Payer: Self-pay

## 2024-04-24 DIAGNOSIS — E876 Hypokalemia: Secondary | ICD-10-CM | POA: Diagnosis not present

## 2024-04-24 DIAGNOSIS — J9811 Atelectasis: Secondary | ICD-10-CM | POA: Diagnosis not present

## 2024-04-24 DIAGNOSIS — G4700 Insomnia, unspecified: Secondary | ICD-10-CM | POA: Diagnosis not present

## 2024-04-24 DIAGNOSIS — Z9181 History of falling: Secondary | ICD-10-CM | POA: Diagnosis not present

## 2024-04-24 DIAGNOSIS — I2510 Atherosclerotic heart disease of native coronary artery without angina pectoris: Secondary | ICD-10-CM | POA: Diagnosis not present

## 2024-04-24 DIAGNOSIS — R1310 Dysphagia, unspecified: Secondary | ICD-10-CM | POA: Diagnosis not present

## 2024-04-24 DIAGNOSIS — K219 Gastro-esophageal reflux disease without esophagitis: Secondary | ICD-10-CM | POA: Diagnosis not present

## 2024-04-24 DIAGNOSIS — D631 Anemia in chronic kidney disease: Secondary | ICD-10-CM | POA: Diagnosis not present

## 2024-04-24 DIAGNOSIS — I110 Hypertensive heart disease with heart failure: Secondary | ICD-10-CM | POA: Diagnosis not present

## 2024-04-24 DIAGNOSIS — N184 Chronic kidney disease, stage 4 (severe): Secondary | ICD-10-CM | POA: Diagnosis not present

## 2024-04-24 DIAGNOSIS — E871 Hypo-osmolality and hyponatremia: Secondary | ICD-10-CM | POA: Diagnosis not present

## 2024-04-24 DIAGNOSIS — I4729 Other ventricular tachycardia: Secondary | ICD-10-CM | POA: Diagnosis not present

## 2024-04-24 DIAGNOSIS — Z86000 Personal history of in-situ neoplasm of breast: Secondary | ICD-10-CM | POA: Diagnosis not present

## 2024-04-24 DIAGNOSIS — E1122 Type 2 diabetes mellitus with diabetic chronic kidney disease: Secondary | ICD-10-CM | POA: Diagnosis not present

## 2024-04-24 NOTE — Transitions of Care (Post Inpatient/ED Visit) (Signed)
   04/24/2024  Name: Emily Jennings MRN: [id number] DOB: 04/30/1934  Today's TOC FU Call Status: Today's TOC FU Call Status:: Successful TOC FU Call Completed TOC FU Call Complete Date: 04/24/24 Patient's Name and Date of Birth confirmed.  Transition Care Management Follow-up Telephone Call How have you been since you were released from the hospital?: Better Any questions or concerns?: No  Items Reviewed: Did you receive and understand the discharge instructions provided?: Yes Medications obtained,verified, and reconciled?: No (patient decline and husband states in the he understands medications and does not need to review.)   Placed call to patient and spoke with patient. Patient reports that she is better.  Reviewed with patient home health nurses concern for pressure area on face and patient was unaware.   Explained reason for call.  Patient declined to review instructions. Husband states ( in the background) no need to review that he understands. Husband also states in the background that home health nurse is coming out again today.   Encouraged patient to call MD for any concerns.  Patient requested phone number and husband states in the back ground that they don't need the phone number.  Then patient declines phone number.   Placed call to home health nurse [facility]. LVM Return call from [place] ( home health nurse with [facility])  she reports that she was able to go over medication changed with patient and husband yesterday.  She reports pressure ulcer is on the right BUTT cheek.    I reviewed with [place] that I tired to get patient to see PCP sooner for staging and husband states they have an appointment on 04/30/2024   [facility] states that this is a tele visit.  [facility] Case manager is working on getting patient an in person visit.   Appreciate the collaboration with home health nurse.  No assessments completed and TOC template not complete as patient and husband decline.  Informed home health nurse  of this as well.   Alan Ee, RN, BSN, CEN [employer]- Transition of Care Team.  [facility] [phone]

## 2024-04-24 NOTE — Telephone Encounter (Signed)
 Verbal orders given

## 2024-04-29 ENCOUNTER — Emergency Department (HOSPITAL_COMMUNITY)

## 2024-04-29 ENCOUNTER — Inpatient Hospital Stay (HOSPITAL_COMMUNITY)
Admission: EM | Admit: 2024-04-29 | Discharge: 2024-05-04 | DRG: 291 | Disposition: A | Discharge status: Home / Self Care | Attending: Internal Medicine | Admitting: Internal Medicine

## 2024-04-29 DIAGNOSIS — F419 Anxiety disorder, unspecified: Secondary | ICD-10-CM | POA: Diagnosis present

## 2024-04-29 DIAGNOSIS — Z7984 Long term (current) use of oral hypoglycemic drugs: Secondary | ICD-10-CM | POA: Diagnosis not present

## 2024-04-29 DIAGNOSIS — Z800 Family history of malignant neoplasm of digestive organs: Secondary | ICD-10-CM | POA: Diagnosis not present

## 2024-04-29 DIAGNOSIS — Z888 Allergy status to other drugs, medicaments and biological substances status: Secondary | ICD-10-CM

## 2024-04-29 DIAGNOSIS — D631 Anemia in chronic kidney disease: Secondary | ICD-10-CM | POA: Diagnosis not present

## 2024-04-29 DIAGNOSIS — Z83438 Family history of other disorder of lipoprotein metabolism and other lipidemia: Secondary | ICD-10-CM | POA: Diagnosis not present

## 2024-04-29 DIAGNOSIS — I509 Heart failure, unspecified: Secondary | ICD-10-CM | POA: Diagnosis not present

## 2024-04-29 DIAGNOSIS — I5033 Acute on chronic diastolic (congestive) heart failure: Secondary | ICD-10-CM | POA: Diagnosis not present

## 2024-04-29 DIAGNOSIS — Z8261 Family history of arthritis: Secondary | ICD-10-CM | POA: Diagnosis not present

## 2024-04-29 DIAGNOSIS — M810 Age-related osteoporosis without current pathological fracture: Secondary | ICD-10-CM | POA: Diagnosis not present

## 2024-04-29 DIAGNOSIS — E871 Hypo-osmolality and hyponatremia: Secondary | ICD-10-CM | POA: Diagnosis not present

## 2024-04-29 DIAGNOSIS — I110 Hypertensive heart disease with heart failure: Secondary | ICD-10-CM | POA: Diagnosis not present

## 2024-04-29 DIAGNOSIS — Z79899 Other long term (current) drug therapy: Secondary | ICD-10-CM

## 2024-04-29 DIAGNOSIS — E1122 Type 2 diabetes mellitus with diabetic chronic kidney disease: Secondary | ICD-10-CM | POA: Diagnosis present

## 2024-04-29 DIAGNOSIS — J9811 Atelectasis: Secondary | ICD-10-CM | POA: Diagnosis not present

## 2024-04-29 DIAGNOSIS — Z825 Family history of asthma and other chronic lower respiratory diseases: Secondary | ICD-10-CM

## 2024-04-29 DIAGNOSIS — E876 Hypokalemia: Secondary | ICD-10-CM | POA: Diagnosis not present

## 2024-04-29 DIAGNOSIS — Z8249 Family history of ischemic heart disease and other diseases of the circulatory system: Secondary | ICD-10-CM

## 2024-04-29 DIAGNOSIS — I2510 Atherosclerotic heart disease of native coronary artery without angina pectoris: Secondary | ICD-10-CM | POA: Diagnosis present

## 2024-04-29 DIAGNOSIS — E11649 Type 2 diabetes mellitus with hypoglycemia without coma: Secondary | ICD-10-CM | POA: Diagnosis not present

## 2024-04-29 DIAGNOSIS — G4700 Insomnia, unspecified: Secondary | ICD-10-CM | POA: Diagnosis not present

## 2024-04-29 DIAGNOSIS — R0989 Other specified symptoms and signs involving the circulatory and respiratory systems: Secondary | ICD-10-CM | POA: Diagnosis not present

## 2024-04-29 DIAGNOSIS — L89150 Pressure ulcer of sacral region, unstageable: Secondary | ICD-10-CM | POA: Diagnosis not present

## 2024-04-29 DIAGNOSIS — Z66 Do not resuscitate: Secondary | ICD-10-CM | POA: Diagnosis not present

## 2024-04-29 DIAGNOSIS — I5030 Unspecified diastolic (congestive) heart failure: Secondary | ICD-10-CM | POA: Diagnosis present

## 2024-04-29 DIAGNOSIS — R1310 Dysphagia, unspecified: Secondary | ICD-10-CM | POA: Diagnosis not present

## 2024-04-29 DIAGNOSIS — N184 Chronic kidney disease, stage 4 (severe): Secondary | ICD-10-CM | POA: Diagnosis present

## 2024-04-29 DIAGNOSIS — K219 Gastro-esophageal reflux disease without esophagitis: Secondary | ICD-10-CM | POA: Diagnosis not present

## 2024-04-29 DIAGNOSIS — Z9181 History of falling: Secondary | ICD-10-CM | POA: Diagnosis not present

## 2024-04-29 DIAGNOSIS — Z833 Family history of diabetes mellitus: Secondary | ICD-10-CM | POA: Diagnosis not present

## 2024-04-29 DIAGNOSIS — Z794 Long term (current) use of insulin: Secondary | ICD-10-CM

## 2024-04-29 DIAGNOSIS — R5381 Other malaise: Secondary | ICD-10-CM | POA: Diagnosis not present

## 2024-04-29 DIAGNOSIS — J90 Pleural effusion, not elsewhere classified: Secondary | ICD-10-CM | POA: Diagnosis not present

## 2024-04-29 DIAGNOSIS — I517 Cardiomegaly: Secondary | ICD-10-CM | POA: Diagnosis not present

## 2024-04-29 DIAGNOSIS — Z823 Family history of stroke: Secondary | ICD-10-CM

## 2024-04-29 DIAGNOSIS — R601 Generalized edema: Secondary | ICD-10-CM | POA: Diagnosis not present

## 2024-04-29 DIAGNOSIS — Z86000 Personal history of in-situ neoplasm of breast: Secondary | ICD-10-CM

## 2024-04-29 DIAGNOSIS — Z885 Allergy status to narcotic agent status: Secondary | ICD-10-CM

## 2024-04-29 DIAGNOSIS — I4729 Other ventricular tachycardia: Secondary | ICD-10-CM | POA: Diagnosis not present

## 2024-04-29 DIAGNOSIS — R0602 Shortness of breath: Secondary | ICD-10-CM | POA: Diagnosis not present

## 2024-04-29 DIAGNOSIS — E861 Hypovolemia: Secondary | ICD-10-CM | POA: Diagnosis not present

## 2024-04-29 DIAGNOSIS — I130 Hypertensive heart and chronic kidney disease with heart failure and stage 1 through stage 4 chronic kidney disease, or unspecified chronic kidney disease: Principal | ICD-10-CM | POA: Diagnosis present

## 2024-04-29 LAB — CBC
HCT: 29.2 % — ABNORMAL LOW (ref 36.0–46.0)
Hemoglobin: 9.5 g/dL — ABNORMAL LOW (ref 12.0–15.0)
MCH: 31.7 pg (ref 26.0–34.0)
MCHC: 32.5 g/dL (ref 30.0–36.0)
MCV: 97.3 fL (ref 80.0–100.0)
Platelets: 293 K/uL (ref 150–400)
RBC: 3 MIL/uL — ABNORMAL LOW (ref 3.87–5.11)
RDW: 14.8 % (ref 11.5–15.5)
WBC: 7.8 K/uL (ref 4.0–10.5)
nRBC: 0 % (ref 0.0–0.2)

## 2024-04-29 LAB — HEPATIC FUNCTION PANEL
ALT: 15 U/L (ref 0–44)
AST: 28 U/L (ref 15–41)
Albumin: 2.8 g/dL — ABNORMAL LOW (ref 3.5–5.0)
Alkaline Phosphatase: 91 U/L (ref 38–126)
Bilirubin, Direct: 0.1 mg/dL (ref 0.0–0.2)
Indirect Bilirubin: 0.5 mg/dL (ref 0.3–0.9)
Total Bilirubin: 0.6 mg/dL (ref 0.0–1.2)
Total Protein: 5.6 g/dL — ABNORMAL LOW (ref 6.5–8.1)

## 2024-04-29 LAB — BRAIN NATRIURETIC PEPTIDE: B Natriuretic Peptide: 2065 pg/mL — ABNORMAL HIGH (ref 0.0–100.0)

## 2024-04-29 LAB — TROPONIN I (HIGH SENSITIVITY)
Troponin I (High Sensitivity): 45 ng/L — ABNORMAL HIGH (ref <18)
Troponin I (High Sensitivity): 42 ng/L — ABNORMAL HIGH (ref <18)

## 2024-04-29 LAB — BASIC METABOLIC PANEL WITH GFR
Anion gap: 14 (ref 5–15)
BUN: 71 mg/dL — ABNORMAL HIGH (ref 8–23)
CO2: 30 mmol/L (ref 22–32)
Calcium: 8.7 mg/dL — ABNORMAL LOW (ref 8.9–10.3)
Chloride: 91 mmol/L — ABNORMAL LOW (ref 98–111)
Creatinine, Ser: 2.98 mg/dL — ABNORMAL HIGH (ref 0.44–1.00)
GFR, Estimated: 15 mL/min — ABNORMAL LOW (ref >60)
Glucose, Bld: 161 mg/dL — ABNORMAL HIGH (ref 70–99)
Potassium: 3.2 mmol/L — ABNORMAL LOW (ref 3.5–5.1)
Sodium: 135 mmol/L (ref 135–145)

## 2024-04-29 LAB — MAGNESIUM: Magnesium: 2.4 mg/dL (ref 1.7–2.4)

## 2024-04-29 LAB — CBG MONITORING, ED: Glucose-Capillary: 162 mg/dL — ABNORMAL HIGH (ref 70–99)

## 2024-04-29 MED ORDER — IPRATROPIUM-ALBUTEROL 0.5-2.5 (3) MG/3ML IN SOLN
3.0000 mL | RESPIRATORY_TRACT | Status: DC | PRN
  Administered 2024-04-29 – 2024-04-30 (×6): 3 mL via RESPIRATORY_TRACT
  Filled 2024-04-29 (×3): qty 3

## 2024-04-29 MED ORDER — HYDRALAZINE HCL 50 MG PO TABS
100.0000 mg | ORAL_TABLET | Freq: Three times a day (TID) | ORAL | Status: DC
  Administered 2024-04-29 – 2024-05-04 (×18): 100 mg via ORAL
  Filled 2024-04-29 (×15): qty 2

## 2024-04-29 MED ORDER — CARVEDILOL 12.5 MG PO TABS
12.5000 mg | ORAL_TABLET | Freq: Two times a day (BID) | ORAL | Status: DC
  Administered 2024-04-29 – 2024-05-01 (×7): 12.5 mg via ORAL
  Filled 2024-04-29 (×4): qty 1

## 2024-04-29 MED ORDER — POTASSIUM CHLORIDE CRYS ER 20 MEQ PO TBCR
40.0000 meq | EXTENDED_RELEASE_TABLET | Freq: Once | ORAL | Status: AC
  Administered 2024-04-29 (×2): 40 meq via ORAL
  Filled 2024-04-29: qty 2

## 2024-04-29 MED ORDER — FUROSEMIDE 10 MG/ML IJ SOLN
40.0000 mg | Freq: Two times a day (BID) | INTRAMUSCULAR | Status: DC
  Administered 2024-04-30 – 2024-05-02 (×8): 40 mg via INTRAVENOUS
  Filled 2024-04-29 (×6): qty 4

## 2024-04-29 MED ORDER — HEPARIN SODIUM (PORCINE) 5000 UNIT/ML IJ SOLN
5000.0000 [IU] | Freq: Three times a day (TID) | INTRAMUSCULAR | Status: DC
  Administered 2024-04-29 – 2024-05-04 (×19): 5000 [IU] via SUBCUTANEOUS
  Filled 2024-04-29 (×15): qty 1

## 2024-04-29 MED ORDER — ROSUVASTATIN CALCIUM 10 MG PO TABS
5.0000 mg | ORAL_TABLET | Freq: Every day | ORAL | Status: DC
  Administered 2024-04-30 – 2024-05-04 (×6): 5 mg via ORAL
  Filled 2024-04-29 (×5): qty 1

## 2024-04-29 MED ORDER — INSULIN ASPART 100 UNIT/ML IJ SOLN: 0.0000 [IU] | Freq: Three times a day (TID) | INTRAMUSCULAR | Status: DC

## 2024-04-29 MED ORDER — FUROSEMIDE 10 MG/ML IJ SOLN
80.0000 mg | INTRAMUSCULAR | Status: AC
  Administered 2024-04-29 (×2): 80 mg via INTRAVENOUS

## 2024-04-29 MED ORDER — CLONIDINE HCL 0.2 MG PO TABS
0.3000 mg | ORAL_TABLET | Freq: Three times a day (TID) | ORAL | Status: DC
  Administered 2024-04-29 – 2024-05-04 (×18): 0.3 mg via ORAL
  Filled 2024-04-29 (×15): qty 1

## 2024-04-29 MED ORDER — ISOSORBIDE MONONITRATE ER 60 MG PO TB24
60.0000 mg | ORAL_TABLET | Freq: Every day | ORAL | Status: DC
  Administered 2024-04-30 – 2024-05-04 (×6): 60 mg via ORAL
  Filled 2024-04-29 (×5): qty 1

## 2024-04-29 MED ORDER — FUROSEMIDE 10 MG/ML IJ SOLN
80.0000 mg | Freq: Once | INTRAMUSCULAR | Status: DC
  Filled 2024-04-29: qty 8

## 2024-04-29 MED ORDER — DILTIAZEM HCL ER COATED BEADS 240 MG PO CP24
240.0000 mg | ORAL_CAPSULE | Freq: Every day | ORAL | Status: DC
  Administered 2024-04-30 – 2024-05-04 (×6): 240 mg via ORAL
  Filled 2024-04-29 (×5): qty 1

## 2024-04-29 MED ORDER — INSULIN ASPART 100 UNIT/ML IJ SOLN: 0.0000 [IU] | Freq: Every day | INTRAMUSCULAR | Status: DC

## 2024-04-29 MED ORDER — INSULIN GLARGINE-YFGN 100 UNIT/ML ~~LOC~~ SOLN
10.0000 [IU] | Freq: Every day | SUBCUTANEOUS | Status: DC
  Administered 2024-04-29 (×2): 10 [IU] via SUBCUTANEOUS
  Filled 2024-04-29 (×2): qty 0.1

## 2024-04-29 NOTE — ED Notes (Signed)
 Pt wheezing at this time. MD notified.

## 2024-04-29 NOTE — ED Notes (Signed)
 Sacral dressing placed.

## 2024-04-29 NOTE — ED Provider Notes (Addendum)
 [place] EMERGENCY DEPARTMENT AT [facility] Provider Note   CSN: [id number] Arrival date & time: 04/29/24  1548     Patient presents with: Shortness of Breath and Leg Swelling (Bilaterally/)   Emily Jennings is a 89 y.o. female.    Shortness of Breath  Patient is a an 89 year old female with past medical history significant for anxiety, DM, HTN  Patient presents emergency room today with complaints of dyspnea for the past 24 to 48 hours.  Seems that she has been significantly more short of breath has had trouble walking has had more swelling in her legs over this period of time.  More dyspneic and now unable to walk.  Family reports increase in weight over the past couple days as well.  Has had goals of care discussion at previous hospital stay that included transition to full comfort care should her renal function worsen.      Prior to Admission medications   Medication Sig Start Date End Date Taking? Authorizing Provider  amLODipine  (NORVASC ) 10 MG tablet Take 10 mg by mouth daily. 04/26/24  Yes [provider]  carvedilol  (COREG ) 12.5 MG tablet Take 12.5 mg by mouth 2 (two) times daily. 04/26/24  Yes [provider]  potassium chloride  (KLOR-CON ) 10 MEQ tablet Take 10 mEq by mouth daily. 04/26/24  Yes [provider]  Accu-Chek Softclix Lancets lancets 1 each by Other route as directed. Use to monitor blood glucose twice daily as instructed 04/22/24   [name], Gebreselassie W, MD  albuterol  (VENTOLIN  HFA) 108 (90 Base) MCG/ACT inhaler Inhale 2 puffs into the lungs every 2 (two) hours as needed for wheezing or shortness of breath. 02/13/24   Pearlean, Courage, MD  Blood Glucose Monitoring Suppl (ACCU-CHEK GUIDE ME) w/Device KIT 1 each by Does not apply route as directed. Use to monitor blood glucose twice daily as directed. 04/22/24   [name], Gebreselassie W, MD  cloNIDine  (CATAPRES ) 0.3 MG tablet Take 1 tablet (0.3 mg total) by mouth 3 (three) times daily.  04/21/24   Johnson, Clanford L, MD  diltiazem  (CARDIZEM  CD) 240 MG 24 hr capsule Take 1 capsule (240 mg total) by mouth daily. 04/22/24   Johnson, Clanford L, MD  glucose blood (ACCU-CHEK GUIDE TEST) test strip 1 each by Other route as needed for other. Use to monitor glucose twice daily as instructed 04/22/24   [name], Gebreselassie W, MD  hydrALAZINE  (APRESOLINE ) 100 MG tablet Take 1 tablet (100 mg total) by mouth 3 (three) times daily. 02/13/24   Pearlean Manus, MD  insulin  degludec (TRESIBA ) 200 UNIT/ML FlexTouch Pen Inject 6 Units into the skin daily at 10 pm. 02/13/24   Pearlean Manus, MD  insulin  lispro (HUMALOG  KWIKPEN) 100 UNIT/ML KwikPen Inject 0-12 Units into the skin 3 (three) times daily. Short acting Humalog  insulin  per sliding scale 0-12 ---:-- Insulin  injection 0-12 Units 0-12 Units Subcutaneous, 3 times daily with meals CBG 70 - 120: 0 unit CBG 121 - 150: 0 unit  CBG 151 - 200: 2 unit CBG 201 - 250: 4 units CBG 251 - 300: 6 units CBG 301 - 350: 8 units  CBG 351 - 400: 10 units  CBG > 400: 12 units 02/13/24   Emokpae, Courage, MD  isosorbide  mononitrate (IMDUR ) 60 MG 24 hr tablet Take 1 tablet (60 mg total) by mouth daily. 02/13/24   Pearlean Manus, MD  magnesium  oxide (MAG-OX) 400 (240 Mg) MG tablet Take 1 tablet (400 mg total) by mouth daily. 02/13/24  Pearlean Manus, MD  RESTASIS  0.05 % ophthalmic emulsion Place 1 drop into both eyes 2 (two) times daily. 11/01/23   [provider]  rosuvastatin  (CRESTOR ) 5 MG tablet Take 1 tablet (5 mg total) by mouth daily. 02/13/24   Pearlean Manus, MD  torsemide  (DEMADEX ) 20 MG tablet Take 2 tablets (40 mg total) by mouth daily. 04/21/24   Vicci Afton CROME, MD    Allergies: Farxiga [dapagliflozin], Metformin  and related, Benadryl [diphenhydramine hcl], Citalopram, Tramadol , Amlodipine , and Crestor  [rosuvastatin ]    Review of Systems  Respiratory:  Positive for shortness of breath.     Updated Vital Signs BP (!) 170/65   Pulse 63    Temp 97.6 F (36.4 C)   Resp 17   Ht 5' (1.524 m)   Wt 60.3 kg   SpO2 94%   BMI 25.97 kg/m   Physical Exam Vitals and nursing note reviewed.  Constitutional:      General: She is not in acute distress. HENT:     Head: Normocephalic and atraumatic.     Nose: Nose normal.  Eyes:     General: No scleral icterus. Cardiovascular:     Rate and Rhythm: Normal rate and regular rhythm.     Pulses: Normal pulses.     Heart sounds: Normal heart sounds.  Pulmonary:     Effort: Pulmonary effort is normal. No respiratory distress.     Breath sounds: Examination of the right-middle field reveals rales. Examination of the left-middle field reveals rales. Examination of the right-lower field reveals rales. Examination of the left-lower field reveals rales. Rales present. No wheezing.     Comments: JVP present Abdominal:     Palpations: Abdomen is soft.     Tenderness: There is no abdominal tenderness.  Musculoskeletal:     Cervical back: Normal range of motion.     Right lower leg: Edema present.     Left lower leg: Edema present.     Comments: Pitting edema to the hips  Right upper extremity with notable edema consistent with her laying on right side  Skin:    General: Skin is warm and dry.     Capillary Refill: Capillary refill takes less than 2 seconds.     Comments: Sacral wounds present (pictured below)  Neurological:     Mental Status: She is alert. Mental status is at baseline.  Psychiatric:        Mood and Affect: Mood normal.        Behavior: Behavior normal.     (all labs ordered are listed, but only abnormal results are displayed) Labs Reviewed  CBC - Abnormal; Notable for the following components:      Result Value   RBC 3.00 (*)    Hemoglobin 9.5 (*)    HCT 29.2 (*)    All other components within normal limits  BASIC METABOLIC PANEL WITH GFR - Abnormal; Notable for the following components:   Potassium 3.2 (*)    Chloride 91 (*)    Glucose, Bld 161 (*)    BUN  71 (*)    Creatinine, Ser 2.98 (*)    Calcium  8.7 (*)    GFR, Estimated 15 (*)    All other components within normal limits  BRAIN NATRIURETIC PEPTIDE - Abnormal; Notable for the following components:   B Natriuretic Peptide 2,065.0 (*)    All other components within normal limits  HEPATIC FUNCTION PANEL - Abnormal; Notable for the following components:   Total Protein 5.6 (*)  Albumin  2.8 (*)    All other components within normal limits  TROPONIN I (HIGH SENSITIVITY) - Abnormal; Notable for the following components:   Troponin I (High Sensitivity) 45 (*)    All other components within normal limits  MAGNESIUM   TROPONIN I (HIGH SENSITIVITY)    EKG: EKG Interpretation Date/Time:  Tuesday April 29 2024 16:00:05 EDT Ventricular Rate:  71 PR Interval:  166 QRS Duration:  80 QT Interval:  436 QTC Calculation: 473 R Axis:   22  Text Interpretation: Normal sinus rhythm Septal infarct , age undetermined Abnormal ECG When compared with ECG of 13-Apr-2024 17:49, Sinus rhythm has replaced Junctional rhythm Vent. rate has increased BY  29 BPM Septal infarct is now Present QT has lengthened Confirmed by Cleotilde Rogue ([id number]) on 04/29/2024 4:01:07 PM  Radiology: DG Chest Portable 1 View Result Date: 04/29/2024 CLINICAL DATA:  Shortness of breath and rales. EXAM: PORTABLE CHEST 1 VIEW COMPARISON:  Chest x-ray 04/07/2024 FINDINGS: The heart is enlarged. There are atherosclerotic calcifications of the aorta. There are small bilateral pleural effusions. There is no lung consolidation or pneumothorax. No acute fractures are seen. IMPRESSION: Cardiomegaly with small bilateral pleural effusions. Electronically Signed   By: Greig Pique M.D.   On: 04/29/2024 17:08     Procedures   Medications Ordered in the ED  furosemide  (LASIX ) injection 80 mg (80 mg Intravenous Given 04/29/24 1747)  potassium chloride  SA (KLOR-CON  M) CR tablet 40 mEq (40 mEq Oral Given 04/29/24 1827)                                     Medical Decision Making Amount and/or Complexity of Data Reviewed Labs: ordered. Radiology: ordered.  Risk Prescription drug management. Decision regarding hospitalization.   This patient presents to the ED for concern of SOB, this involves a number of treatment options, and is a complaint that carries with it a high risk of complications and morbidity. A differential diagnosis was considered for the patient's symptoms which is discussed below:   The causes for shortness of breath include but are not limited to Cardiac (AHF, pericardial effusion and tamponade, arrhythmias, ischemia, etc) Respiratory (COPD, asthma, pneumonia, pneumothorax, primary pulmonary hypertension, PE/VQ mismatch) Hematological (anemia) Neuromuscular (ALS, Guillain-Barr, etc)    Co morbidities: Discussed in HPI   Brief History:  Patient is a an 89 year old female with past medical history significant for anxiety, DM, HTN  Patient presents emergency room today with complaints of dyspnea for the past 24 to 48 hours.  Seems that she has been significantly more short of breath has had trouble walking has had more swelling in her legs over this period of time.  More dyspneic and now unable to walk.  Family reports increase in weight over the past couple days as well.  Has had goals of care discussion at previous hospital stay that included transition to full comfort care should her renal function worsen.     EMR reviewed including pt PMHx, past surgical history and past visits to ER.   See HPI for more details   Lab Tests:   I ordered and independently interpreted labs. Labs notable for CBC with baseline anemia no acute changes.  BMP mild hypokalemia creatinine at baseline LFTs unremarkable troponin elevated consistent with CHF no acute chest pain.  Second troponin pending.  BNP elevated at greater than 2000 consistent with CHF exacerbation magnesium  2.4    Imaging  Studies:  Pleural  effusions, cardiomegaly    Cardiac Monitoring:  The patient was maintained on a cardiac monitor.  I personally viewed and interpreted the cardiac monitored which showed an underlying rhythm of: NSR EKG non-ischemic   Medicines ordered:  I ordered medication including p.o. potassium, IV Lasix  for diuresis Reevaluation of the patient after these medicines showed that the patient stayed the same I have reviewed the patients home medicines and have made adjustments as needed   Critical Interventions:   admission   Consults/Attending Physician   I discussed this case with my attending physician who cosigned this note including patient's presenting symptoms, physical exam, and planned diagnostics and interventions. Attending physician stated agreement with plan or made changes to plan which were implemented.  Patient admitted to hospitalist service  Reevaluation:  After the interventions noted above I re-evaluated patient and found that they have :stayed the same   Social Determinants of Health:      Problem List / ED Course:  Patient with acute on chronic heart failure worsening dyspnea yesterday not hypoxic in the emergency department but more dyspneic.  Treated with IV Lasix  80 mg given p.o. potassium repletion given mild hypokalemia currently.  No chest pain, admitted to hospitalist service. Patient with right upper extremity edema however it does seem to preferentially lay on her right side which leaves right upper extremity in dependent position with likely decreased venous return    Dispostion:  After consideration of the diagnostic results and the patients response to treatment, I feel that the patent would benefit from admission  Final diagnoses:  Acute on chronic congestive heart failure, unspecified heart failure type [facility])  Anasarca    ED Discharge Orders     None          Neldon Hamp RAMAN, [place] 04/29/24 1925    Neldon Hamp RAMAN, [place] 04/29/24  1946    Cleotilde Rogue, MD 04/29/24 2057

## 2024-04-29 NOTE — ED Triage Notes (Signed)
 Pt comes in for SOB. Pt has been on going. Lately the SOB has been going on for a while. Pt was in the hospital last month for stroke. Family has been having issues with the swelling in the legs. Pt is on lasix  and has been taking them. Pt is unable to stand after the stroke. Pt does have confusion at times then is completely a&Ox4. Husband states its been that way since the stroke.   Pt does have bed sores on her bottom

## 2024-04-29 NOTE — H&P (Signed)
 History and Physical    Patient: Emily Jennings FMW:[id number] DOB: 1934/01/06 DOA: 04/29/2024 DOS: the patient was seen and examined on 04/29/2024 PCP: Antonetta Rollene BRAVO, MD  Patient coming from: Home  Chief Complaint: Generalized swelling Chief Complaint  Patient presents with   Shortness of Breath   Leg Swelling    Bilaterally    HPI: Emily Jennings is a 89 y.o. female with medical history significant of anxiety disorder, HFpEF, previous history of breast cancer, coronary artery disease, diabetes mellitus type 2, hypertension, GERD who was recently admitted here from 04/07/2024 to 04/21/2024 for acute on chronic HFpEF comes in again today with complaints of progressive worsening lower extremity swelling as well as shortness of breath.  During her recent admission patient was seen by palliative medicine with plans to continue with diuresis as long as it is working but no plans for hemodialysis and if patient becomes unresponsive to diuresis to consider transitioning to comfort care measures. Shortness of breath became more pronounced within the last 2 weeks with associated worsening of bilateral lower extremity swelling. She denies chest pain, nausea vomiting, abdominal pain or urinary complaints.  ED course Upon arrival to the emergency room temperature 97.6, respiratory rate 17, pulse 69, BP 176/66 saturating 93% on room air.  BNP greater than 2000, chest x-ray showing cardiomegaly as well as small bilateral pleural effusion.  Troponin 45 with repeat of 42.  Given above findings hospitalist service was contacted to admit patient for further management  Review of Systems: As mentioned in the history of present illness. All other systems reviewed and are negative. Past Medical History:  Diagnosis Date   Allergy    Anxiety    ANXIETY DISORDER, GENERALIZED 07/15/2007   Qualifier: Diagnosis of  By: Aisha Motes     Arthritis    Cancer [facility]) 2009   breast, carcinoma in situ left    Carcinoma in situ of breast 05/21/2008   Qualifier: Diagnosis of  By: Antonetta MD, Margaret  Diagnosed in 2009, completed 5 year course of tamoxifen, no evidence of recurrence    Carotid stenosis    11/16/2005  mild plaque formation and stenosis proximal right ECA   Cataract    Complication of anesthesia    Coronary artery disease    cardiac catheterization on 03/20/2006  LAD mid 40% stenosis, left circumflex mild 40% stenosis, RCA mid-vessel 40% to 50% lesion   EF 60%   Diabetes mellitus    GERD (gastroesophageal reflux disease)    Hernia, inguinal    left   Hyperglycemia    Hypertension    Insomnia 11/16/2011   Low blood potassium    Non-insulin  dependent type 2 diabetes mellitus (HCC)    Osteoporosis    Shortness of breath    2D Echocardiogram 01/26/2009   EF of greater than 55%, mild MR, mild TR, normal ventricular function   Thickened endometrium 10/26/2017   Noted by gyne in 2017, missed 6 month follow up, referred in 09/2017   Ventricular tachycardia, non-sustained (HCC)    developed during stress test 02/08/2006, spontaneously aborted, mild reversible apical defect   Past Surgical History:  Procedure Laterality Date   BOWEL RESECTION Left 07/19/2023   Procedure: SMALL BOWEL RESECTION;  Surgeon: Kallie Manuelita BROCKS, MD;  Location: [facility];  Service: General;  Laterality: Left;   BREAST LUMPECTOMY Left 2009   Left breast 2009   CATARACT EXTRACTION W/PHACO Left 10/28/2014   Procedure: PHACO EMULSION CATARACT EXTRACTION WITH INTRAOCULAR LENS IMPLANT LEFT EYE (  IOC);  Surgeon: Gaither Quan, MD;  Location[facility] OR;  Service: Ophthalmology;  Laterality: Left;   COLONOSCOPY     cyst removed from left foot     INGUINAL HERNIA REPAIR Left 07/19/2023   Procedure: HERNIA REPAIR FEMORAL INCARCERATED;  Surgeon: Kallie Manuelita BROCKS, MD;  Location: [facility];  Service: General;  Laterality: Left;   REFRACTIVE SURGERY Left    Social History:  reports that she has never smoked. She has never been exposed  to tobacco smoke. She has never used smokeless tobacco. She reports that she does not drink alcohol and does not use drugs.  Allergies  Allergen Reactions   Farxiga [Dapagliflozin] Other (See Comments)    DKA   Metformin  And Related Diarrhea    Lost appetite and weight    Benadryl [Diphenhydramine Hcl] Hypertension   Citalopram Other (See Comments)    Unknown    Tramadol  Other (See Comments)    Felt light headed and dizzy   Amlodipine  Swelling   Crestor  [Rosuvastatin ] Swelling and Other (See Comments)    Feet swelling Makes her feel weak. Pt takes 5mg  at home    Family History  Problem Relation Age of Onset   Hypertension Mother    Hyperlipidemia Mother    Stroke Mother    Urticaria Mother    Cancer Father        pancreatic   Colon cancer Father    Heart disease Brother 40       bypass   Heart disease Brother 44       bypass   Arthritis Other    Asthma Other    Diabetes Other    Colon cancer Paternal Aunt    Esophageal cancer Neg Hx    Stomach cancer Neg Hx    Rectal cancer Neg Hx     Prior to Admission medications   Medication Sig Start Date End Date Taking? Authorizing Provider  amLODipine  (NORVASC ) 10 MG tablet Take 10 mg by mouth daily. 04/26/24  Yes [provider]  carvedilol  (COREG ) 12.5 MG tablet Take 12.5 mg by mouth 2 (two) times daily. 04/26/24  Yes [provider]  potassium chloride  (KLOR-CON ) 10 MEQ tablet Take 10 mEq by mouth daily. 04/26/24  Yes [provider]  Accu-Chek Softclix Lancets lancets 1 each by Other route as directed. Use to monitor blood glucose twice daily as instructed 04/22/24   [name], Gebreselassie W, MD  albuterol  (VENTOLIN  HFA) 108 (90 Base) MCG/ACT inhaler Inhale 2 puffs into the lungs every 2 (two) hours as needed for wheezing or shortness of breath. 02/13/24   Pearlean, Courage, MD  Blood Glucose Monitoring Suppl (ACCU-CHEK GUIDE ME) w/Device KIT 1 each by Does not apply route as directed. Use to monitor blood  glucose twice daily as directed. 04/22/24   [name], Gebreselassie W, MD  cloNIDine  (CATAPRES ) 0.3 MG tablet Take 1 tablet (0.3 mg total) by mouth 3 (three) times daily. 04/21/24   Johnson, Clanford L, MD  diltiazem  (CARDIZEM  CD) 240 MG 24 hr capsule Take 1 capsule (240 mg total) by mouth daily. 04/22/24   Johnson, Clanford L, MD  glucose blood (ACCU-CHEK GUIDE TEST) test strip 1 each by Other route as needed for other. Use to monitor glucose twice daily as instructed 04/22/24   [name], Gebreselassie W, MD  hydrALAZINE  (APRESOLINE ) 100 MG tablet Take 1 tablet (100 mg total) by mouth 3 (three) times daily. 02/13/24   Pearlean Manus, MD  insulin  degludec (TRESIBA ) 200 UNIT/ML FlexTouch Pen Inject 6  Units into the skin daily at 10 pm. 02/13/24   Pearlean Manus, MD  insulin  lispro (HUMALOG  KWIKPEN) 100 UNIT/ML KwikPen Inject 0-12 Units into the skin 3 (three) times daily. Short acting Humalog  insulin  per sliding scale 0-12 ---:-- Insulin  injection 0-12 Units 0-12 Units Subcutaneous, 3 times daily with meals CBG 70 - 120: 0 unit CBG 121 - 150: 0 unit  CBG 151 - 200: 2 unit CBG 201 - 250: 4 units CBG 251 - 300: 6 units CBG 301 - 350: 8 units  CBG 351 - 400: 10 units  CBG > 400: 12 units 02/13/24   Emokpae, Courage, MD  isosorbide  mononitrate (IMDUR ) 60 MG 24 hr tablet Take 1 tablet (60 mg total) by mouth daily. 02/13/24   Pearlean Manus, MD  magnesium  oxide (MAG-OX) 400 (240 Mg) MG tablet Take 1 tablet (400 mg total) by mouth daily. 02/13/24   Pearlean Manus, MD  RESTASIS  0.05 % ophthalmic emulsion Place 1 drop into both eyes 2 (two) times daily. 11/01/23   [provider]  rosuvastatin  (CRESTOR ) 5 MG tablet Take 1 tablet (5 mg total) by mouth daily. 02/13/24   Pearlean Manus, MD  torsemide  (DEMADEX ) 20 MG tablet Take 2 tablets (40 mg total) by mouth daily. 04/21/24   Vicci Afton CROME, MD    Physical Exam: Vitals:   04/29/24 1745 04/29/24 1800 04/29/24 1830 04/29/24 1900  BP: (!) 160/58 (!) 170/65 (!)  193/71 (!) 193/80  Pulse: (!) 58 63 67 69  Resp:    15  Temp:      SpO2: 95% 94% 96% 97%  Weight:      Height:       General: Patient seen laying in bed chronically ill-appearing Resp: Bilateral basal crackles Abdomen: Full, obese nondistended no masses palpable CVS: S1, S2 present, pansystolic murmur loudest in the mitral area Msk: Extensive pitting edema in bilateral lower extremity today abdominal wall CNS: Patient is awake and alert and communicative Psych:  normal mood  Data Reviewed: I have reviewed patient's chest x-ray showing cardiomegaly with small bilateral pleural effusion.     Latest Ref Rng & Units 04/29/2024    4:34 PM 04/19/2024    6:09 AM 04/18/2024    4:53 AM  CBC  WBC 4.0 - 10.5 K/uL 7.8  7.0  6.3   Hemoglobin 12.0 - 15.0 g/dL 9.5  8.9  9.5   Hematocrit 36.0 - 46.0 % 29.2  27.0  28.1   Platelets 150 - 400 K/uL 293  98  103        Latest Ref Rng & Units 04/29/2024    4:34 PM 04/21/2024    4:30 AM 04/19/2024    6:09 AM  BMP  Glucose 70 - 99 mg/dL 838  887  846   BUN 8 - 23 mg/dL 71  91  82   Creatinine 0.44 - 1.00 mg/dL 7.01  7.00  6.89   Sodium 135 - 145 mmol/L 135  135  136   Potassium 3.5 - 5.1 mmol/L 3.2  3.9  3.8   Chloride 98 - 111 mmol/L 91  94  92   CO2 22 - 32 mmol/L 30  30  31    Calcium  8.9 - 10.3 mg/dL 8.7  8.7  8.6      Assessment and Plan:  Acute on chronic HFpEF Echo in May 2025 showed EF 70 to 75% BNP 2065, chest x-ray showing findings of cardiomegaly and small bilateral pleural effusion Continue IV diuresis Monitor renal  function while on diuresis Monitor input and output as well as daily weight According to patient and the family she has been compliant with her Coreg  and so we will continue  Hypokalemia Presented with potassium 3.2 Received potassium repletion in the emergency room Continue monitoring  Normocytic anemia likely secondary to anemia of chronic disease No indication for transfusion at this time Monitor CBC  closely  Unstageable sacral wound Wound care consulted  Anxiety disorder Does not appear to be on any home medications  History of breast cancer No acute intervention at this time  Coronary artery disease Continue statin therapy  Diabetes mellitus type 2 Continue insulin  therapy Monitor glucose level closely  CKD stage IV Patient with baseline creatinine of 2.6 to 3 Presents with creatinine of 2.9 Follows up with nephrology as an outpatient Continue to monitor renal function while on diuresis  Essential hypertension Continue Coreg , Imdur , hydralazine , clonidine , Lasix  Monitor blood pressure closely  DVT prophylaxis-continue heparin   Disposition: Patient may need rehab placement in the setting of generalized weakness and ambulatory dysfunction PT OT consulted    Advance Care Planning:   Code Status: Prior discussed with husband as well as son at bedside who confirmed patient is DNI DNR  Consults: None  Family Communication: Discussed with husband as well as son at bedside  Severity of Illness: The appropriate patient status for this patient is INPATIENT. Inpatient status is judged to be reasonable and necessary in order to provide the required intensity of service to ensure the patient's safety. The patient's presenting symptoms, physical exam findings, and initial radiographic and laboratory data in the context of their chronic comorbidities is felt to place them at high risk for further clinical deterioration. Furthermore, it is not anticipated that the patient will be medically stable for discharge from the hospital within 2 midnights of admission.   * I certify that at the point of admission it is my clinical judgment that the patient will require inpatient hospital care spanning beyond 2 midnights from the point of admission due to high intensity of service, high risk for further deterioration and high frequency of surveillance required.*  Author: Drue ONEIDA Potter,  MD 04/29/2024 7:34 PM  For on call review [email or web address].

## 2024-04-30 ENCOUNTER — Ambulatory Visit

## 2024-04-30 DIAGNOSIS — R601 Generalized edema: Secondary | ICD-10-CM | POA: Diagnosis not present

## 2024-04-30 DIAGNOSIS — I5033 Acute on chronic diastolic (congestive) heart failure: Secondary | ICD-10-CM | POA: Diagnosis not present

## 2024-04-30 LAB — GLUCOSE, CAPILLARY
Glucose-Capillary: 13 mg/dL — CL (ref 70–99)
Glucose-Capillary: 37 mg/dL — CL (ref 70–99)
Glucose-Capillary: <10 mg/dL — CL (ref 70–99)
Glucose-Capillary: 128 mg/dL — ABNORMAL HIGH (ref 70–99)
Glucose-Capillary: 92 mg/dL (ref 70–99)
Glucose-Capillary: 125 mg/dL — ABNORMAL HIGH (ref 70–99)
Glucose-Capillary: 226 mg/dL — ABNORMAL HIGH (ref 70–99)

## 2024-04-30 LAB — BASIC METABOLIC PANEL WITH GFR
Anion gap: 11 (ref 5–15)
BUN: 69 mg/dL — ABNORMAL HIGH (ref 8–23)
CO2: 30 mmol/L (ref 22–32)
Calcium: 8.3 mg/dL — ABNORMAL LOW (ref 8.9–10.3)
Chloride: 94 mmol/L — ABNORMAL LOW (ref 98–111)
Creatinine, Ser: 3.04 mg/dL — ABNORMAL HIGH (ref 0.44–1.00)
GFR, Estimated: 14 mL/min — ABNORMAL LOW (ref >60)
Glucose, Bld: 53 mg/dL — ABNORMAL LOW (ref 70–99)
Potassium: 3.2 mmol/L — ABNORMAL LOW (ref 3.5–5.1)
Sodium: 135 mmol/L (ref 135–145)

## 2024-04-30 LAB — CBC
HCT: 26.3 % — ABNORMAL LOW (ref 36.0–46.0)
Hemoglobin: 8.3 g/dL — ABNORMAL LOW (ref 12.0–15.0)
MCH: 30.9 pg (ref 26.0–34.0)
MCHC: 31.6 g/dL (ref 30.0–36.0)
MCV: 97.8 fL (ref 80.0–100.0)
Platelets: 266 K/uL (ref 150–400)
RBC: 2.69 MIL/uL — ABNORMAL LOW (ref 3.87–5.11)
RDW: 14.7 % (ref 11.5–15.5)
WBC: 7 K/uL (ref 4.0–10.5)
nRBC: 0 % (ref 0.0–0.2)

## 2024-04-30 LAB — GLUCOSE, RANDOM: Glucose, Bld: 54 mg/dL — ABNORMAL LOW (ref 70–99)

## 2024-04-30 MED ORDER — INSULIN ASPART 100 UNIT/ML IJ SOLN
0.0000 [IU] | Freq: Three times a day (TID) | INTRAMUSCULAR | Status: DC
  Administered 2024-05-01: 1 [IU] via SUBCUTANEOUS
  Administered 2024-05-01 (×2): 4 [IU] via SUBCUTANEOUS
  Administered 2024-05-02: 3 [IU] via SUBCUTANEOUS
  Administered 2024-05-02 – 2024-05-03 (×4): 5 [IU] via SUBCUTANEOUS
  Administered 2024-05-03: 3 [IU] via SUBCUTANEOUS
  Administered 2024-05-04: 2 [IU] via SUBCUTANEOUS

## 2024-04-30 MED ORDER — DEXTROSE 50 % IV SOLN
INTRAVENOUS | Status: AC
  Administered 2024-04-30 (×2): 50 mL
  Filled 2024-04-30: qty 50

## 2024-04-30 MED ORDER — COLLAGENASE 250 UNIT/GM EX OINT
TOPICAL_OINTMENT | Freq: Every day | CUTANEOUS | Status: DC
  Administered 2024-05-03: 1 via TOPICAL
  Filled 2024-04-30: qty 30

## 2024-04-30 MED ORDER — LABETALOL HCL 5 MG/ML IV SOLN
20.0000 mg | Freq: Once | INTRAVENOUS | Status: AC
  Administered 2024-04-30 (×2): 20 mg via INTRAVENOUS
  Filled 2024-04-30: qty 4

## 2024-04-30 MED ORDER — DEXTROSE 50 % IV SOLN
1.0000 | Freq: Once | INTRAVENOUS | Status: AC
  Administered 2024-04-30 (×2): 50 mL via INTRAVENOUS
  Filled 2024-04-30: qty 50

## 2024-04-30 NOTE — TOC Initial Note (Signed)
 Transition of Care [facility]) - Initial/Assessment Note    Patient Details  Name: Emily Jennings MRN: [id number] Date of Birth: Feb 14, 1934  Transition of Care [facility]) CM/SW Contact:    Sharlyne Stabs, RN Phone Number: 04/30/2024, 11:08 AM  Clinical Narrative:       Patient admitted readmitted with heart failure, well known to [facility], PT is recommending SNF. CM at the beside. Patient states she will return home. She is acitve with [facility] outpatient team also following. Need diuresing will discharge in 2-3 days.             Expected Discharge Plan: Home w Home Health Services Barriers to Discharge: Continued Medical Work up   Patient Goals and CMS Choice Patient states their goals for this hospitalization and ongoing recovery are:: Return home CMS Medicare.gov Compare Post Acute Care list provided to:: Patient Choice offered to / list presented to : Patient Prospect ownership interest in [facility].provided to:: Patient    Expected Discharge Plan and Services       Living arrangements for the past 2 months: Single Family Home                                      Prior Living Arrangements/Services Living arrangements for the past 2 months: Single Family Home Lives with:: Spouse Patient language and need for interpreter reviewed:: Yes Do you feel safe going back to the place where you live?: Yes      Need for Family Participation in Patient Care: Yes (Comment) Care giver support system in place?: Yes (comment) Current home services: DME Criminal Activity/Legal Involvement Pertinent to Current Situation/Hospitalization: No - Comment as needed  Activities of Daily Living   ADL Screening (condition at time of admission) Independently performs ADLs?: No Does the patient have a NEW difficulty with bathing/dressing/toileting/self-feeding that is expected to last >3 days?: No Does the patient have a NEW difficulty with getting in/out of bed, walking, or climbing  stairs that is expected to last >3 days?: No Does the patient have a NEW difficulty with communication that is expected to last >3 days?: No  Permission Sought/Granted            Permission granted to share info w Relationship: Spouse     Emotional Assessment       Orientation: : Oriented to Self, Oriented to Place, Oriented to Situation Alcohol / Substance Use: Not Applicable Psych Involvement: No (comment)  Admission diagnosis:  Anasarca [R60.1] (HFpEF) heart failure with preserved ejection fraction (HCC) [I50.30] Acute on chronic congestive heart failure, unspecified heart failure type (HCC) [I50.9] Patient Active Problem List   Diagnosis Date Noted   (HFpEF) heart failure with preserved ejection fraction (HCC) 04/29/2024   Palliative care by specialist 04/21/2024   Anemia due to chronic kidney disease 02/04/2024   Type 2 diabetes mellitus with hyperglycemia (HCC) 01/22/2024   Insulin  long-term use (HCC) 12/05/2023   Type 2 diabetes mellitus with other specified complication (HCC) 09/25/2023   Acute kidney injury superimposed on stage 4 chronic kidney disease (HCC) 08/14/2023   Small bowel obstruction (HCC) 07/19/2023   Ischemic bowel disease (HCC) 07/19/2023   Incarcerated left inguinal hernia 07/18/2023   Hypoglycemia 07/18/2023   Uncontrolled type 2 diabetes mellitus with hypoglycemia, with long-term current use of insulin  (HCC) 07/04/2023   Dysphagia 07/04/2023   HCAP (healthcare-associated pneumonia) 07/03/2023   Essential hypertension 06/15/2023  Cerebellar edema (HCC) 05/23/2023   Mixed hyperlipidemia 05/23/2023   DNR (do not resuscitate) 05/17/2023   Vocal cord dysfunction 05/17/2023   DNR (do not resuscitate) discussion 05/17/2023   Goals of care, counseling/discussion 05/17/2023   Right cerebellar intraparenchymal hemorrhage due to hypertension 05/16/2023   Unsteady gait when walking 04/03/2023   Hospital discharge follow-up 03/06/2023   Bilateral kidney  stones 03/06/2023   Hip swelling, right 02/27/2023   DKA (diabetic ketoacidosis) (HCC) 02/17/2023   High anion gap metabolic acidosis 02/17/2023   Pseudohyponatremia 02/17/2023   Hypoalbuminemia due to protein-calorie malnutrition (HCC) 02/17/2023   Dehydration 02/17/2023   Hip pain 02/15/2023   Encounter for Medicare annual examination with abnormal findings 10/16/2022   Vitamin D  deficiency 10/16/2022   Acute on chronic congestive heart failure with left ventricular diastolic dysfunction (HCC) 09/13/2022   Chronic kidney disease (CKD) stage G3b/A2, moderately decreased glomerular filtration rate (GFR) between 30-44 mL/min/1.73 square meter and albuminuria creatinine ratio between 30-299 mg/g (HCC) 09/13/2022   Mild aortic valve stenosis 09/13/2022   Dyspnea on exertion 09/05/2022   CHF (congestive heart failure) (HCC) 09/05/2022   Hyperkalemia 04/09/2022   Encounter for support and coordination of transition of care 04/09/2022   Hyperglycemic crisis due to diabetes mellitus (HCC) 03/25/2022   Hyponatremia 03/25/2022   Acute metabolic encephalopathy 03/22/2022   Hypothermia 03/22/2022   Heart murmur 03/22/2022   Bilateral lower extremity edema 03/22/2022   Syncope and collapse 01/13/2021   Lip swelling 10/30/2020   CKD stage 4 due to type 2 diabetes mellitus (HCC) 10/30/2020   Tubular adenoma of colon 10/26/2017   Diabetes (HCC) 07/27/2016   Osteoporosis 07/28/2014   Diabetic hypertension-nephrosis syndrome (HCC) 03/23/2014   Type 2 diabetes mellitus with stage 3a chronic kidney disease, with long-term current use of insulin  (HCC) 09/29/2013   Allergic rhinitis 01/27/2013   GERD (gastroesophageal reflux disease) 01/12/2013   CVD (cardiovascular disease) 07/17/2011   Hypokalemia 08/31/2008   Hyperlipidemia LDL goal <100 07/15/2007   Resistant hypertension 07/15/2007   PCP:  Antonetta Rollene BRAVO, MD Pharmacy:   [facility] [phone] - [place], [place] - [address] [facility] [address] [place] [address] Phone: [phone] Fax: [phone]  [facility]) - [place] - [address] [facility] [address] [place] [address] [place] [address] Phone: [phone] Fax: [phone]  [facility] [address] [place] [address] Phone: [phone] Fax: [phone]     Social Drivers of Health (SDOH) Social History: SDOH Screenings   Food Insecurity: No Food Insecurity (04/30/2024)  Housing: Low Risk  (04/07/2024)  Transportation Needs: No Transportation Needs (04/07/2024)  Utilities: Not At Risk (04/07/2024)  Alcohol Screen: Low Risk  (11/15/2022)  Depression (PHQ2-9): Low Risk  (03/20/2024)  Financial Resource Strain: Low Risk  (12/28/2022)  Physical Activity: Sufficiently Active (11/15/2022)  Social Connections: Socially Integrated (04/07/2024)  Stress: No Stress Concern Present (12/28/2022)  Tobacco Use: Low Risk  (04/07/2024)   SDOH Interventions:     Readmission Risk Interventions    04/19/2024    9:40 AM 04/18/2024    9:45 AM 04/08/2024   10:49 AM  Readmission Risk Prevention Plan  Transportation Screening Complete Complete Complete  Medication Review Oceanographer) Complete Complete Complete  PCP or Specialist appointment within 3-5 days of discharge   Not Complete  HRI or Home Care Consult Complete Complete Complete  SW Recovery Care/Counseling Consult Complete Complete   Palliative Care Screening Patient  Refused Patient Refused Not Applicable  Skilled Nursing Facility Patient Refused Patient Refused Not Applicable

## 2024-04-30 NOTE — Progress Notes (Signed)
 Nurse at  bedside,patient has a decubitus ulcer to sacrum the size was 4.8 X 4.5 in length to width,ulcer had some greenish drainage noted with odor. Wound cleaned and dressing was applied.Dr Ricky notified.Plan of care on going.

## 2024-04-30 NOTE — Progress Notes (Signed)
 Progress Note   Patient: Emily Jennings FMW:[id number] DOB: 1933-10-30 DOA: 04/29/2024     1 DOS: the patient was seen and examined on 04/30/2024   Brief hospital admission narrative: As per H&P written by Dr. Dorinda on 04/29/2024 Emily Jennings is a 89 y.o. female with medical history significant of anxiety disorder, HFpEF, previous history of breast cancer, coronary artery disease, diabetes mellitus type 2, hypertension, GERD who was recently admitted here from 04/07/2024 to 04/21/2024 for acute on chronic HFpEF comes in again today with complaints of progressive worsening lower extremity swelling as well as shortness of breath.  During her recent admission patient was seen by palliative medicine with plans to continue with diuresis as long as it is working but no plans for hemodialysis and if patient becomes unresponsive to diuresis to consider transitioning to comfort care measures. Shortness of breath became more pronounced within the last 2 weeks with associated worsening of bilateral lower extremity swelling. She denies chest pain, nausea vomiting, abdominal pain or urinary complaints.   ED course Upon arrival to the emergency room temperature 97.6, respiratory rate 17, pulse 69, BP 176/66 saturating 93% on room air.  BNP greater than 2000, chest x-ray showing cardiomegaly as well as small bilateral pleural effusion.  Troponin 45 with repeat of 42.  Given above findings hospitalist service was contacted to admit patient for further management  Assessment and plan 1-acute on chronic diastolic heart failure - Continue IV diuresis - Follow daily weights, strict I's and O's and low-sodium diet - Apply TED hoses - Check REDs clip measurement -Follow clinical response. - Recent echo in May demonstrating ejection fraction 70 to 75% without wall motion abnormality; will not repeat.  2-hypokalemia - Continue electrolyte repletion and follow trend - Continue telemetry monitoring. - Will check magnesium   level  3-chronic kidney disease stage IV - Creatinine appears to be at baseline at the moment - Continue close monitoring of renal function especially while actively diuresing patient - Follow volume status and urine output response.  4-type 2 diabetes mellitus with nephropathy - Continue adjusted dose of sliding scale insulin  - Holding long-acting insulin  at the moment - Patient advised not to skip any meals - Follow CBG fluctuation - Episode of hypoglycemic appreciated overnight.  5-chronic anemia - No overt bleeding appreciated - Follow hemoglobin trend.  6-unstageable sacral wound present at time of admission - No signs of superimposed infection; continue consult repositioning and local care.  7-essential hypertension - Continue treatment with Coreg , Imdur , hydralazine , clonidine  and IV Lasix  at the moment - Follow-up blood pressure stability and will adjust medications as needed.  8-history of coronary artery disease - Chest pain-free currently - Continue medical therapy - Continue patient follow-up with cardiology service.  9-history of breast cancer - Appears to be in remission - Continue outpatient follow-up with oncology service  10-anxiety disorder - Continue supportive care - Patient currently not taking any anxiolytic therapy prior to admission.   Subjective:  Afebrile, no nausea or vomiting.  Still demonstrating signs of fluid overload and complaining of short winded sensation with minimal activity.  Physical Exam: Vitals:   04/30/24 1100 04/30/24 1303 04/30/24 1353 04/30/24 1650  BP:  (!) 182/73 (!) 178/72 (!) 194/76  Pulse:  (!) 57    Resp:      Temp:      TempSrc:      SpO2:  99%    Weight: 53.7 kg     Height:       General  exam: Alert, awake, oriented x 3; with signs of fluid overload and short winded sensation with minimal activity. Respiratory system: Fine crackles appreciated at the bases; not using accessory muscles. Cardiovascular  system:RRR. No rubs or gallop; no JVD on exam. Gastrointestinal system: Abdomen is nondistended, soft and nontender. No organomegaly or masses felt. Normal bowel sounds heard.No focal neurological deficits. Extremities: No cyanosis or clubbing; 1-2+ edema appreciated bilaterally. Skin: No petechiae.  Unstageable sacral pressure injury present at time of admission; no signs of superimposed infection appreciated. Psychiatry: Judgement and insight appear normal.  Following commands appropriately.   Data Reviewed: Basic metabolic panel: Sodium 135, potassium 3.2, chloride 94, bicarb 30, BUN 69, creatinine 3.04 GFR 14 CBC: White blood cell 7.0, hemoglobin 8.3 and platelet count 266K.  Family Communication: No family at bedside.  Disposition: Status is: Inpatient Remains inpatient appropriate because: Continue IV diuresis.  Dissipating discharge plan home once medically stable.  Time spent: 50 minutes  Author: Eric Nunnery, MD 04/30/2024 6:22 PM  For on call review [email or web address].

## 2024-04-30 NOTE — Consult Note (Signed)
 WOC Nurse Consult Note: this patient known to [facility] team from previous admission with deep tissue pressure injury (consult 04/21/2024), see media from that date as well  Reason for Consult: sacral wound  Wound type: Unstageable Pressure Injury sacrum/upper buttocks  Pressure Injury POA: Yes  Measurement: see nursing flowsheet  Wound bed: 100% tan necrotic  Drainage (amount, consistency, odor) see nursing flowsheet  Periwound: intact  Dressing procedure/placement/frequency: Cleanse sacral and buttocks wounds with Vashe wound cleanser Soila [id number]) do not rinse and allow to air dry.  Apply 1/4 thick layer of Santyl  to wound bed, top with saline moistened gauze, dry gauze and secure with silicone foam or ABD pad and tape whichever is preferred.   Patient should be placed on low air loss mattress for pressure redistribution and moisture management.    POC discussed with bedside nurse. WOC team will not follow. Re-consult if further needs arise.   Thank you,    Powell Bar MSN, RN-BC, [employer]

## 2024-04-30 NOTE — Plan of Care (Signed)
  Problem: Acute Rehab OT Goals (only OT should resolve) Goal: Pt. Will Perform Grooming Flowsheets (Taken 04/30/2024 1055) Pt Will Perform Grooming:  with supervision  standing Goal: Pt. Will Perform Lower Body Bathing Flowsheets (Taken 04/30/2024 1055) Pt Will Perform Lower Body Bathing:  with min assist  sitting/lateral leans  with adaptive equipment Goal: Pt. Will Perform Upper Body Dressing Flowsheets (Taken 04/30/2024 1055) Pt Will Perform Upper Body Dressing: with modified independence Goal: Pt. Will Perform Lower Body Dressing Flowsheets (Taken 04/30/2024 1055) Pt Will Perform Lower Body Dressing:  with contact guard assist  with min assist  with adaptive equipment  sitting/lateral leans Goal: Pt. Will Transfer To Toilet Flowsheets (Taken 04/30/2024 1055) Pt Will Transfer to Toilet:  with contact guard assist  with supervision  ambulating  stand pivot transfer Goal: Pt. Will Perform Toileting-Clothing Manipulation Flowsheets (Taken 04/30/2024 1055) Pt Will Perform Toileting - Clothing Manipulation and hygiene:  with min assist  sitting/lateral leans  with contact guard assist Goal: Pt/Caregiver Will Perform Home Exercise Program Flowsheets (Taken 04/30/2024 1055) Pt/caregiver will Perform Home Exercise Program:  Increased strength  Both right and left upper extremity  Independently  Jayjay Littles OT, MOT

## 2024-04-30 NOTE — Progress Notes (Signed)
 Nurse at bedside,patient alert and oriented to person,place,and situation,confused to time.Blood glucose level 128 now.Patient is more alert and talking to me.Patient sitting up eating her breakfast now.Plan of care ongoing.Length of time in room one hour and 20 minutes.[name]

## 2024-04-30 NOTE — Evaluation (Signed)
 Occupational Therapy Evaluation Patient Details Name: Emily Jennings MRN: [id number] DOB: [birth year] Today's Date: 04/30/2024   History of Present Illness   Emily Jennings is a 89 y.o. female with medical history significant of anxiety disorder, HFpEF, previous history of breast cancer, coronary artery disease, diabetes mellitus type 2, hypertension, GERD who was recently admitted here from 04/07/2024 to 04/21/2024 for acute on chronic HFpEF comes in again today with complaints of progressive worsening lower extremity swelling as well as shortness of breath.  During her recent admission patient was seen by palliative medicine with plans to continue with diuresis as long as it is working but no plans for hemodialysis and if patient becomes unresponsive to diuresis to consider transitioning to comfort care measures. Shortness of breath became more pronounced within the last 2 weeks with associated worsening of bilateral lower extremity swelling.  She denies chest pain, nausea vomiting, abdominal pain or urinary complaints. (per MD)     Clinical Impressions Pt agreeable to OT and PT co-evaluation. Pt reports that she has not been mobile at home since returning from the SNF and reports assist for ADL's since being home. Pt required min to mod A for bed mobility and mod A for transfer to the chair with RW. B UE generally weak. Max to total assist for lower body ADL's per pt's inability to manage socks today. Set up assist likely for upper body ADL's. No family present to confirm level of assist or support at home. Pt left in the chair with the chair alarm set and the call bell within reach. Pt will benefit from continued OT in the hospital and recommended venue below to increase strength, balance, and endurance for safe ADL's.        If plan is discharge home, recommend the following:   A lot of help with walking and/or transfers;A lot of help with bathing/dressing/bathroom;Assistance with  cooking/housework;Assist for transportation;Help with stairs or ramp for entrance;Direct supervision/assist for medications management     Functional Status Assessment   Patient has had a recent decline in their functional status and demonstrates the ability to make significant improvements in function in a reasonable and predictable amount of time.     Equipment Recommendations   None recommended by OT             Precautions/Restrictions   Precautions Precautions: Fall Recall of Precautions/Restrictions: Impaired Restrictions Weight Bearing Restrictions Per Provider Order: No     Mobility Bed Mobility Overal bed mobility: Needs Assistance Bed Mobility: Supine to Sit     Supine to sit: Min assist, Mod assist     General bed mobility comments: labored movement; assist to pull to sit    Transfers Overall transfer level: Needs assistance Equipment used: Rolling walker (2 wheels) Transfers: Sit to/from Stand, Bed to chair/wheelchair/BSC Sit to Stand: Mod assist     Step pivot transfers: Mod assist     General transfer comment: unstead, labored movement      Balance Overall balance assessment: Needs assistance Sitting-balance support: Feet supported, No upper extremity supported Sitting balance-Leahy Scale: Good Sitting balance - Comments: fair to good seated at EOB   Standing balance support: Bilateral upper extremity supported, Reliant on assistive device for balance, During functional activity Standing balance-Leahy Scale: Poor Standing balance comment: poor to fair with RW                           ADL either performed or assessed  with clinical judgement   ADL Overall ADL's : Needs assistance/impaired     Grooming: Set up;Sitting   Upper Body Bathing: Set up;Sitting   Lower Body Bathing: Maximal assistance;Total assistance;Sitting/lateral leans   Upper Body Dressing : Set up;Sitting   Lower Body Dressing: Maximal  assistance;Total assistance;Bed level Lower Body Dressing Details (indicate cue type and reason): Pt uanble to doff socks; asked to try seated in the recliner but pt stated she could not do so. Toilet Transfer: Moderate assistance;Stand-pivot;Rolling walker (2 wheels) Toilet Transfer Details (indicate cue type and reason): EOB to chair with RW Toileting- Clothing Manipulation and Hygiene: Maximal assistance;Sitting/lateral lean       Functional mobility during ADLs: Moderate assistance;Rolling walker (2 wheels) General ADL Comments: Able to ambulate beside the bed for a total of ~6 to 8 feet forward and backward with RW.     Vision Baseline Vision/History: 1 Wears glasses Ability to See in Adequate Light: 1 Impaired Patient Visual Report: No change from baseline Vision Assessment?: No apparent visual deficits     Perception Perception: Not tested       Praxis Praxis: Not tested       Pertinent Vitals/Pain Pain Assessment Pain Assessment: Faces Faces Pain Scale: Hurts little more Pain Location: gluteal pain Pain Descriptors / Indicators: Discomfort Pain Intervention(s): Monitored during session, Repositioned     Extremity/Trunk Assessment Upper Extremity Assessment Upper Extremity Assessment: Generalized weakness (3-/5 bilateral shoulder flexion; generally weak otherwise.)   Lower Extremity Assessment Lower Extremity Assessment: Defer to PT evaluation   Cervical / Trunk Assessment Cervical / Trunk Assessment: Kyphotic   Communication Communication Communication: Impaired Factors Affecting Communication: Difficulty expressing self   Cognition Arousal: Alert Behavior During Therapy: WFL for tasks assessed/performed Cognition: No family/caregiver present to determine baseline             OT - Cognition Comments: Pt able to follow commands but did seem possibly confused. Mild expressive difficulties.                 Following commands: Intact        Cueing  General Comments   Cueing Techniques: Verbal cues;Tactile cues                 Home Living Family/patient expects to be discharged to:: Private residence Living Arrangements: Spouse/significant other;Children Available Help at Discharge: Family;Available 24 hours/day Type of Home: House Home Access: Stairs to enter [employer] of Steps: 1 Entrance Stairs-Rails: None Home Layout: One level     Bathroom Shower/Tub: Chief Strategy Officer: Standard Bathroom Accessibility: Yes   Home Equipment: Cane - single point;Shower Counsellor (2 wheels)   Additional Comments: Reports no change in home set up/support since last admission      Prior Functioning/Environment Prior Level of Function : Needs assist       Physical Assist : Mobility (physical);ADLs (physical) Mobility (physical): Transfers;Gait;Stairs;Bed mobility ADLs (physical): Grooming;Bathing;Toileting;Dressing;IADLs Mobility Comments: Pt reports she has not ambulated since returning home from SNF. ADLs Comments: Pt reports assist for all ADL's other than feeding.    OT Problem List: Decreased strength;Decreased activity tolerance;Impaired balance (sitting and/or standing);Decreased cognition;Pain   OT Treatment/Interventions: Self-care/ADL training;Therapeutic exercise;Therapeutic activities;Patient/family education;Balance training;DME and/or AE instruction      OT Goals(Current goals can be found in the care plan section)   Acute Rehab OT Goals Patient Stated Goal: improve function OT Goal Formulation: With patient Time For Goal Achievement: 05/14/24 Potential to Achieve Goals: Good   OT  Frequency:  Min 3X/week    Co-evaluation PT/OT/SLP Co-Evaluation/Treatment: Yes Reason for Co-Treatment: To address functional/ADL transfers   OT goals addressed during session: ADL's and self-care                       End of Session Equipment Utilized During  Treatment: Rolling walker (2 wheels);Gait belt;Oxygen  Activity Tolerance: Patient tolerated treatment well Patient left: in chair;with call bell/phone within reach;with chair alarm set  OT Visit Diagnosis: Unsteadiness on feet (R26.81);Other abnormalities of gait and mobility (R26.89);Muscle weakness (generalized) (M62.81)                Time: 9064-9046 OT Time Calculation (min): 18 min Charges:  OT General Charges $OT Visit: 1 Visit OT Evaluation $OT Eval Low Complexity: 1 Low  Lailah Marcelli OT, MOT   Jayson Person 04/30/2024, 10:52 AM

## 2024-04-30 NOTE — Progress Notes (Addendum)
 Nurse at bedside,NT notified me that patient's blood glucose level was 13.Patient was clammy,and patient was restless,StatesI feel so bad. IV started 22 gauge to left anterior forearm times one attempt,patient tolerated procedure. Dextrose  one ampule given IV per orders..Dr Ricky notified.Orders received and given. Recheck of patient's blood glucose was now 37.Labs ordered.Plan of care on going.

## 2024-04-30 NOTE — Progress Notes (Signed)
 Mobility Specialist Progress Note:    04/30/24 1108  Mobility  Activity Stood at bedside;Pivoted/transferred from chair to bed  Level of Assistance Moderate assist, patient does 50-74%  Assistive Device Other (Comment) (HHA)  Distance Ambulated (ft) 2 ft  Range of Motion/Exercises Active;All extremities  Activity Response Tolerated well  Mobility Referral Yes  Mobility visit 1 Mobility  Mobility Specialist Start Time (ACUTE ONLY) 1046  Mobility Specialist Stop Time (ACUTE ONLY) 1104  Mobility Specialist Time Calculation (min) (ACUTE ONLY) 18 min   Pt received requesting assistance. Required ModA to stand and pivot with hand-held assist. Tolerated well, asx throughout. Left supine with nursing staff, all needs met.   Sherrilee Ditty Mobility Specialist Please contact via Special educational needs teacher or  Rehab office at [phone]

## 2024-04-30 NOTE — Inpatient Diabetes Management (Signed)
 Inpatient Diabetes Program Recommendations  AACE/ADA: New Consensus Statement on Inpatient Glycemic Control  Target Ranges:  Prepandial:   less than 140 mg/dL      Peak postprandial:   less than 180 mg/dL (1-2 hours)      Critically ill patients:  140 - 180 mg/dL    Latest Reference Range & Units 04/29/24 21:05 04/30/24 07:30  Glucose-Capillary 70 - 99 mg/dL 837 (H) <89 (LL)    Latest Reference Range & Units 04/30/24 04:03  Glucose 70 - 99 mg/dL 53 (L)   Review of Glycemic Control  Diabetes history: DM2 Outpatient Diabetes medications: Tresiba  6 units at bedtime, Humalog  0-12 units TID with meals Current orders for Inpatient glycemic control: Semglee  10 units at bedtime, Novolog  0-9 units TID with meals, Novolog  0-5 units QHS  Inpatient Diabetes Program Recommendations:    Insulin : CBG <10 mg/dl at 2:69 am today. Patient received Semglee  10 units last night. Please discontinue Semglee  for now and change CBGs to Q4H and SSI to Novolog  0-6 units Q4H.  Thanks, Earnie Gainer, RN, MSN, CDCES Diabetes Coordinator Inpatient Diabetes Program [phone] (Team Pager from 8am to 5pm)

## 2024-04-30 NOTE — Plan of Care (Signed)
  Problem: Acute Rehab PT Goals(only PT should resolve) Goal: Pt Will Go Supine/Side To Sit Outcome: Progressing Flowsheets (Taken 04/30/2024 1349) Pt will go Supine/Side to Sit: with minimal assist Goal: Patient Will Transfer Sit To/From Stand Outcome: Progressing Flowsheets (Taken 04/30/2024 1349) Patient will transfer sit to/from stand: with minimal assist Goal: Pt Will Transfer Bed To Chair/Chair To Bed Outcome: Progressing Flowsheets (Taken 04/30/2024 1349) Pt will Transfer Bed to Chair/Chair to Bed: with min assist Goal: Pt Will Ambulate Outcome: Progressing Flowsheets (Taken 04/30/2024 1349) Pt will Ambulate:  15 feet  with minimal assist  with rolling walker

## 2024-04-30 NOTE — Plan of Care (Signed)
  Problem: Skin Integrity: Goal: Risk for impaired skin integrity will decrease Outcome: Not Progressing   Problem: Tissue Perfusion: Goal: Adequacy of tissue perfusion will improve Outcome: Not Progressing

## 2024-04-30 NOTE — Evaluation (Signed)
 Physical Therapy Evaluation Patient Details Name: Emily Jennings MRN: [id number] DOB: [birth year] Today's Date: 04/30/2024  History of Present Illness  Emily Jennings is a 89 y.o. female with medical history significant of anxiety disorder, HFpEF, previous history of breast cancer, coronary artery disease, diabetes mellitus type 2, hypertension, GERD who was recently admitted here from 04/07/2024 to 04/21/2024 for acute on chronic HFpEF comes in again today with complaints of progressive worsening lower extremity swelling as well as shortness of breath.  During her recent admission patient was seen by palliative medicine with plans to continue with diuresis as long as it is working but no plans for hemodialysis and if patient becomes unresponsive to diuresis to consider transitioning to comfort care measures. Shortness of breath became more pronounced within the last 2 weeks with associated worsening of bilateral lower extremity swelling.  She denies chest pain, nausea vomiting, abdominal pain or urinary complaints.   Clinical Impression  Patient demonstrates slow labored movement for sitting up at bedside and completing functional transfers requiring min/modA to successfully complete despite. Patient with audible wheezing throughout session but SpO2 maintained ~97% on supplemental O2. Patient able to take a few steps forward and back at bedside but very limited due to poor activity tolerance and generalized weakness. Patient left in recliner with chair alarm set and call bell in reach Patient will benefit from continued skilled physical therapy in hospital and recommended venue below to increase strength, balance, endurance for safe ADLs and gait.         If plan is discharge home, recommend the following: Help with stairs or ramp for entrance;Assist for transportation;Assistance with cooking/housework;A lot of help with bathing/dressing/bathroom;A lot of help with walking and/or transfers   Can travel by  private vehicle        Equipment Recommendations None recommended by PT  Recommendations for Other Services       Functional Status Assessment Patient has had a recent decline in their functional status and demonstrates the ability to make significant improvements in function in a reasonable and predictable amount of time.     Precautions / Restrictions Precautions Precautions: Fall Recall of Precautions/Restrictions: Impaired Restrictions Weight Bearing Restrictions Per Provider Order: No      Mobility  Bed Mobility Overal bed mobility: Needs Assistance Bed Mobility: Supine to Sit     Supine to sit: Min assist, Mod assist     General bed mobility comments: labored movement; assist to pull to sit    Transfers Overall transfer level: Needs assistance Equipment used: Rolling walker (2 wheels) Transfers: Sit to/from Stand, Bed to chair/wheelchair/BSC Sit to Stand: Mod assist   Step pivot transfers: Mod assist       General transfer comment: unsteady, labored movement    Ambulation/Gait Ambulation/Gait assistance: Min assist, Mod assist Gait Distance (Feet): 5 Feet Assistive device: Rolling walker (2 wheels) Gait Pattern/deviations: Decreased step length - right, Decreased step length - left, Trunk flexed, Decreased stride length Gait velocity: slow     General Gait Details: labored movement, increased effort, limited to fatigue and BIL LE weakness; audible wheezing SpO02 97%  Stairs            Wheelchair Mobility     Tilt Bed    Modified Rankin (Stroke Patients Only)       Balance Overall balance assessment: Needs assistance Sitting-balance support: Feet supported, No upper extremity supported Sitting balance-Leahy Scale: Good Sitting balance - Comments: fair to good seated at EOB   Standing balance  support: Bilateral upper extremity supported, Reliant on assistive device for balance, During functional activity Standing balance-Leahy Scale:  Poor Standing balance comment: poor to fair with RW                             Pertinent Vitals/Pain Pain Assessment Pain Assessment: Faces Faces Pain Scale: Hurts little more Pain Location: gluteal pain Pain Descriptors / Indicators: Discomfort Pain Intervention(s): Monitored during session, Repositioned    Home Living Family/patient expects to be discharged to:: Private residence Living Arrangements: Spouse/significant other;Children Available Help at Discharge: Family;Available 24 hours/day Type of Home: House Home Access: Stairs to enter Entrance Stairs-Rails: None Entrance Stairs-Number of Steps: 1   Home Layout: One level Home Equipment: Cane - single point;Shower Counsellor (2 wheels) Additional Comments: Reports no change in home set up/support since last admission    Prior Function Prior Level of Function : Needs assist       Physical Assist : Mobility (physical);ADLs (physical) Mobility (physical): Transfers;Gait;Stairs;Bed mobility ADLs (physical): Grooming;Bathing;Toileting;Dressing;IADLs Mobility Comments: Pt reports she has not ambulated since returning home from last admission ADLs Comments: Pt reports assist for all ADL's other than feeding.     Extremity/Trunk Assessment   Upper Extremity Assessment Upper Extremity Assessment: Defer to OT evaluation    Lower Extremity Assessment Lower Extremity Assessment: Generalized weakness    Cervical / Trunk Assessment Cervical / Trunk Assessment: Kyphotic  Communication   Communication Communication: Impaired Factors Affecting Communication: Difficulty expressing self    Cognition Arousal: Alert Behavior During Therapy: WFL for tasks assessed/performed   PT - Cognitive impairments: No family/caregiver present to determine baseline                       PT - Cognition Comments: Some difficulty recalling home set up, unable to recall place Following commands: Intact        Cueing Cueing Techniques: Verbal cues, Tactile cues     General Comments      Exercises     Assessment/Plan    PT Assessment Patient needs continued PT services  PT Problem List Decreased strength;Decreased activity tolerance;Decreased balance;Decreased mobility       PT Treatment Interventions DME instruction;Gait training;Functional mobility training;Therapeutic activities;Therapeutic exercise;Balance training;Patient/family education    PT Goals (Current goals can be found in the Care Plan section)  Acute Rehab PT Goals Patient Stated Goal: return home PT Goal Formulation: With patient Time For Goal Achievement: 05/14/24 Potential to Achieve Goals: Good    Frequency Min 3X/week     Co-evaluation PT/OT/SLP Co-Evaluation/Treatment: Yes Reason for Co-Treatment: To address functional/ADL transfers PT goals addressed during session: Mobility/safety with mobility;Balance;Proper use of DME OT goals addressed during session: ADL's and self-care       AM-PAC PT 6 Clicks Mobility  Outcome Measure Help needed turning from your back to your side while in a flat bed without using bedrails?: A Little Help needed moving from lying on your back to sitting on the side of a flat bed without using bedrails?: A Little Help needed moving to and from a bed to a chair (including a wheelchair)?: A Lot Help needed standing up from a chair using your arms (e.g., wheelchair or bedside chair)?: A Lot Help needed to walk in hospital room?: A Little Help needed climbing 3-5 steps with a railing? : Total 6 Click Score: 14    End of Session Equipment Utilized During Treatment: Gait belt Activity Tolerance:  Patient limited by fatigue Patient left: in chair;with call bell/phone within reach;with chair alarm set Nurse Communication: Mobility status PT Visit Diagnosis: Unsteadiness on feet (R26.81);Other abnormalities of gait and mobility (R26.89);Muscle weakness (generalized) (M62.81)     Time: 9071-9042 PT Time Calculation (min) (ACUTE ONLY): 29 min   Charges:   PT Evaluation $PT Eval Moderate Complexity: 1 Mod PT Treatments $Therapeutic Activity: 23-37 mins PT General Charges $$ ACUTE PT VISIT: 1 Visit         1:47 PM, 04/30/24,  Zorianna Taliaferro, SPT

## 2024-05-01 ENCOUNTER — Encounter (HOSPITAL_COMMUNITY): Payer: Self-pay | Admitting: Internal Medicine

## 2024-05-01 ENCOUNTER — Other Ambulatory Visit: Payer: Self-pay

## 2024-05-01 DIAGNOSIS — I5033 Acute on chronic diastolic (congestive) heart failure: Secondary | ICD-10-CM | POA: Diagnosis not present

## 2024-05-01 DIAGNOSIS — R601 Generalized edema: Secondary | ICD-10-CM | POA: Diagnosis not present

## 2024-05-01 LAB — GLUCOSE, CAPILLARY
Glucose-Capillary: 323 mg/dL — ABNORMAL HIGH (ref 70–99)
Glucose-Capillary: 304 mg/dL — ABNORMAL HIGH (ref 70–99)
Glucose-Capillary: 180 mg/dL — ABNORMAL HIGH (ref 70–99)
Glucose-Capillary: 202 mg/dL — ABNORMAL HIGH (ref 70–99)

## 2024-05-01 MED ORDER — CARVEDILOL 12.5 MG PO TABS
25.0000 mg | ORAL_TABLET | Freq: Two times a day (BID) | ORAL | Status: DC
  Administered 2024-05-01 – 2024-05-04 (×6): 25 mg via ORAL
  Filled 2024-05-01 (×6): qty 2

## 2024-05-01 MED ORDER — LABETALOL HCL 5 MG/ML IV SOLN
20.0000 mg | INTRAVENOUS | Status: DC | PRN
  Administered 2024-05-01 – 2024-05-03 (×3): 20 mg via INTRAVENOUS
  Filled 2024-05-01 (×4): qty 4

## 2024-05-01 NOTE — Plan of Care (Signed)
  Problem: Education: Goal: Ability to describe self-care measures that may prevent or decrease complications (Diabetes Survival Skills Education) will improve Outcome: Progressing Goal: Individualized Educational Video(s) Outcome: Progressing   Problem: Coping: Goal: Ability to adjust to condition or change in health will improve Outcome: Not Met (add Reason)   Problem: Fluid Volume: Goal: Ability to maintain a balanced intake and output will improve Outcome: Progressing   Problem: Health Behavior/Discharge Planning: Goal: Ability to identify and utilize available resources and services will improve Outcome: Progressing Goal: Ability to manage health-related needs will improve Outcome: Not Met (add Reason)

## 2024-05-01 NOTE — Progress Notes (Signed)
   04/30/24 2149  Assess: MEWS Score  Temp 97.7 F (36.5 C)  BP (!) 220/75  MAP (mmHg) 116  Pulse Rate 74  Level of Consciousness Alert  SpO2 96 %  O2 Device Nasal Cannula  O2 Flow Rate (L/min) 2.5 L/min  Assess: MEWS Score  MEWS Temp 0  MEWS Systolic 2  MEWS Pulse 0  MEWS RR 0  MEWS LOC 0  MEWS Score 2  MEWS Score Color Yellow  Assess: if the MEWS score is Yellow or Red  Were vital signs accurate and taken at a resting state? Yes  Does the patient meet 2 or more of the SIRS criteria? No  MEWS guidelines implemented  No, previously yellow, continue vital signs every 4 hours  Notify: Charge Nurse/RN  Name of Charge Nurse/RN Notified Ellouise Sar RN  Provider Notification  Provider Name/Title Dr Charlton  Date Provider Notified 04/30/24  Time Provider Notified 2152  Method of Notification  (Secure Chat)  Notification Reason Change in status  Provider response See new orders  Date of Provider Response 04/30/24  Time of Provider Response 2158  Assess: SIRS CRITERIA  SIRS Temperature  0  SIRS Respirations  0  SIRS Pulse 0  SIRS WBC 0  SIRS Score Sum  0

## 2024-05-01 NOTE — Inpatient Diabetes Management (Signed)
 Inpatient Diabetes Program Recommendations  AACE/ADA: New Consensus Statement on Inpatient Glycemic Control  Target Ranges:  Prepandial:   less than 140 mg/dL      Peak postprandial:   less than 180 mg/dL (1-2 hours)      Critically ill patients:  140 - 180 mg/dL    Latest Reference Range & Units 04/30/24 07:30 04/30/24 07:32 04/30/24 07:51 04/30/24 08:47 04/30/24 11:12 04/30/24 16:14 04/30/24 21:13 05/01/24 07:14  Glucose-Capillary 70 - 99 mg/dL <89 (LL) 13 (LL) 37 (LL) 128 (H) 92 125 (H) 226 (H) 323 (H)    Review of Glycemic Control  Diabetes history: DM2 Outpatient Diabetes medications: Tresiba  6 units at bedtime, Humalog  0-12 units TID with meals Current orders for Inpatient glycemic control: Novolog  0-6 units TID with meals   Inpatient Diabetes Program Recommendations:    Insulin : Patient received Semglee  10 units on 04/29/24 at 22:02 and patient experienced hypoglycemia yesterday morning. CBG this morning is 323 mg/dl. Please consider ordering Semglee  3 units Q24H starting now.    Thanks, Earnie Gainer, RN, MSN, CDCES Diabetes Coordinator Inpatient Diabetes Program [phone] (Team Pager from 8am to 5pm)

## 2024-05-01 NOTE — Progress Notes (Signed)
   05/01/24 0806  Vitals  Temp 98.1 F (36.7 C)  Temp Source Oral  BP (!) 204/71  MAP (mmHg) 108  BP Method Automatic  Pulse Rate 73  Level of Consciousness  Level of Consciousness Alert  Oxygen Therapy  SpO2 96 %  O2 Device Nasal Cannula  O2 Flow Rate (L/min) 2.5 L/min  Pain Assessment  Pain Scale 0-10  Pain Score 4  Pain Type Acute pain  Pain Location Buttocks  Pain Intervention(s) Repositioned  Provider Notification  Provider Name/Title MD [facility]  Date Provider Notified 05/01/24  Time Provider Notified [phone]

## 2024-05-01 NOTE — Progress Notes (Signed)
 Occupational Therapy Treatment Patient Details Name: Emily Jennings MRN: [id number] DOB: [birth year] Today's Date: 05/01/2024   History of present illness Emily Jennings is a 89 y.o. female with medical history significant of anxiety disorder, HFpEF, previous history of breast cancer, coronary artery disease, diabetes mellitus type 2, hypertension, GERD who was recently admitted here from 04/07/2024 to 04/21/2024 for acute on chronic HFpEF comes in again today with complaints of progressive worsening lower extremity swelling as well as shortness of breath.  During her recent admission patient was seen by palliative medicine with plans to continue with diuresis as long as it is working but no plans for hemodialysis and if patient becomes unresponsive to diuresis to consider transitioning to comfort care measures. Shortness of breath became more pronounced within the last 2 weeks with associated worsening of bilateral lower extremity swelling.  She denies chest pain, nausea vomiting, abdominal pain or urinary complaints.   OT comments  Pt agreeable to OT treatment. Pt required min A for bed mobility and min to mod A for step pivot transfer to the chair. Pt able to improve level of independence with lower body dressing to min A per observation of pt attempting the task seated in the chair. Pt able to ambulate to the bathroom with min to mod A using RW. Assist for peri-care to ensure cleanliness, but pt was able to wipe herself as well. Pt left in the chair with chair alarm set and call bell within reach. Pt will benefit from continued OT in the hospital and recommended venue below to increase strength, balance, and endurance for safe ADL's.         If plan is discharge home, recommend the following:  A lot of help with walking and/or transfers;A lot of help with bathing/dressing/bathroom;Assistance with cooking/housework;Assist for transportation;Help with stairs or ramp for entrance;Direct supervision/assist  for medications management   Equipment Recommendations  None recommended by OT          Precautions / Restrictions Precautions Precautions: Fall Recall of Precautions/Restrictions: Impaired Restrictions Weight Bearing Restrictions Per Provider Order: No       Mobility Bed Mobility Overal bed mobility: Needs Assistance Bed Mobility: Supine to Sit     Supine to sit: HOB elevated, Used rails, Contact guard, Min assist     General bed mobility comments: labored effort    Transfers Overall transfer level: Needs assistance Equipment used: Rolling walker (2 wheels) Transfers: Sit to/from Stand, Bed to chair/wheelchair/BSC Sit to Stand: Mod assist     Step pivot transfers: Min assist, Mod assist     General transfer comment: usnteady movement; assist to boost from the EOB before taking steps to the chair.     Balance Overall balance assessment: Needs assistance Sitting-balance support: Feet supported, No upper extremity supported Sitting balance-Leahy Scale: Good Sitting balance - Comments: fair to good seated at EOB   Standing balance support: Bilateral upper extremity supported, Reliant on assistive device for balance, During functional activity Standing balance-Leahy Scale: Poor Standing balance comment: poor to fair with RW                           ADL either performed or assessed with clinical judgement   ADL Overall ADL's : Needs assistance/impaired                     Lower Body Dressing: Minimal assistance;Sitting/lateral leans Lower Body Dressing Details (indicate cue type and reason): Pt  able to doff L LE sock with verbal cuing. Pt was able to don L LE sock and seemed able to do so with the R LE as well, but did not fully remove the sock. Completed in the recliner with legs elevated. Toilet Transfer: Moderate assistance;Minimal assistance;Rolling walker (2 wheels);Ambulation Toilet Transfer Details (indicate cue type and reason): EOB to  toilet with RW; unsteady with difficulty maintaining balance at times. Toileting- Clothing Manipulation and Hygiene: Moderate assistance;Sitting/lateral lean Toileting - Clothing Manipulation Details (indicate cue type and reason): Pt able to wipe herself while standing with mod A to stand with RW. This therapist went back over the area with a wash cloth to ensure it was clean.     Functional mobility during ADLs: Minimal assistance;Moderate assistance;Rolling walker (2 wheels) General ADL Comments: Ambulated to the toilet and then to the chair with RW.    Extremity/Trunk Assessment Upper Extremity Assessment Upper Extremity Assessment: Generalized weakness                             Communication Communication Communication: No apparent difficulties   Cognition Arousal: Alert Behavior During Therapy: WFL for tasks assessed/performed Cognition: No family/caregiver present to determine baseline                               Following commands: Intact        Cueing   Cueing Techniques: Verbal cues, Tactile cues  Exercises                   Pertinent Vitals/ Pain       Pain Assessment Pain Assessment: Faces Faces Pain Scale: Hurts little more Pain Location: gluteal pain Pain Descriptors / Indicators: Discomfort Pain Intervention(s): Monitored during session, Repositioned                                                          Frequency  Min 3X/week        Progress Toward Goals  OT Goals(current goals can now be found in the care plan section)  Progress towards OT goals: Progressing toward goals  Acute Rehab OT Goals Patient Stated Goal: improve function OT Goal Formulation: With patient Time For Goal Achievement: 05/14/24 Potential to Achieve Goals: Good ADL Goals Pt Will Perform Grooming: with supervision;standing Pt Will Perform Lower Body Bathing: with min assist;sitting/lateral leans;with adaptive  equipment Pt Will Perform Upper Body Dressing: with modified independence Pt Will Perform Lower Body Dressing: with contact guard assist;with min assist;with adaptive equipment;sitting/lateral leans Pt Will Transfer to Toilet: with contact guard assist;with supervision;ambulating;stand pivot transfer Pt Will Perform Toileting - Clothing Manipulation and hygiene: with min assist;sitting/lateral leans;with contact guard assist Pt/caregiver will Perform Home Exercise Program: Increased strength;Both right and left upper extremity;Independently  Plan                                      End of Session Equipment Utilized During Treatment: Gait belt;Rolling walker (2 wheels)  OT Visit Diagnosis: Unsteadiness on feet (R26.81);Other abnormalities of gait and mobility (R26.89);Muscle weakness (generalized) (M62.81)   Activity Tolerance Patient tolerated treatment well   Patient  Left in chair;with call bell/phone within reach;with chair alarm set   Nurse Communication Mobility status;Other (comment) (notified pt wanted more food)        Time: 8658-8590 OT Time Calculation (min): 28 min  Charges: OT General Charges $OT Visit: 1 Visit OT Treatments $Self Care/Home Management : 23-37 mins  Tyna Huertas OT, MOT  Jayson Person 05/01/2024, 2:54 PM

## 2024-05-01 NOTE — Progress Notes (Signed)
 Progress Note   Patient: Emily Jennings FMW:[id number] DOB: 06-Dec-1933 DOA: 04/29/2024     2 DOS: the patient was seen and examined on 05/01/2024   Brief hospital admission narrative: As per H&P written by Dr. Dorinda on 04/29/2024 Emily Jennings is a 89 y.o. female with medical history significant of anxiety disorder, HFpEF, previous history of breast cancer, coronary artery disease, diabetes mellitus type 2, hypertension, GERD who was recently admitted here from 04/07/2024 to 04/21/2024 for acute on chronic HFpEF comes in again today with complaints of progressive worsening lower extremity swelling as well as shortness of breath.  During her recent admission patient was seen by palliative medicine with plans to continue with diuresis as long as it is working but no plans for hemodialysis and if patient becomes unresponsive to diuresis to consider transitioning to comfort care measures. Shortness of breath became more pronounced within the last 2 weeks with associated worsening of bilateral lower extremity swelling. She denies chest pain, nausea vomiting, abdominal pain or urinary complaints.   ED course Upon arrival to the emergency room temperature 97.6, respiratory rate 17, pulse 69, BP 176/66 saturating 93% on room air.  BNP greater than 2000, chest x-ray showing cardiomegaly as well as small bilateral pleural effusion.  Troponin 45 with repeat of 42.  Given above findings hospitalist service was contacted to admit patient for further management  Assessment and plan 1-acute on chronic diastolic heart failure - Continue IV diuresis - Follow daily weights, strict I's and O's and low-sodium diet - Apply TED hoses - Check REDs clip measurement -Follow clinical response. - Recent echo in May demonstrating ejection fraction 70 to 75% without wall motion abnormality; will not repeat.  2-hypokalemia - Continue electrolyte repletion and follow trend - Continue telemetry monitoring. - Will check magnesium   level  3-chronic kidney disease stage IV - Creatinine appears to be at baseline at the moment - Continue close monitoring of renal function especially while actively diuresing patient - Follow volume status and urine output response.  4-type 2 diabetes mellitus with nephropathy - Continue adjusted dose of sliding scale insulin  - Holding long-acting insulin  at the moment - Patient advised not to skip any meals - Follow CBG fluctuation - Episode of hypoglycemic appreciated overnight.  5-chronic anemia - No overt bleeding appreciated - Follow hemoglobin trend.  6-unstageable sacral wound present at time of admission - No signs of superimposed infection; continue consult repositioning and local care.  7-essential hypertension - Continue treatment with Coreg , Imdur , hydralazine , clonidine  and IV Lasix  at the moment - Follow-up blood pressure stability and will adjust medications as needed.  8-history of coronary artery disease - Chest pain-free currently - Continue medical therapy - Continue patient follow-up with cardiology service.  9-history of breast cancer - Appears to be in remission - Continue outpatient follow-up with oncology service  10-anxiety disorder - Continue supportive care - Patient currently not taking any anxiolytic therapy prior to admission.   Subjective:  No fever, no chest pain, no nausea or vomiting.  Patient no using accessory muscles on exam.  2 L nasal cannula in place.  Reported good urine output and overall improvement in swelling.  Physical Exam: Vitals:   05/01/24 0406 05/01/24 0806 05/01/24 1204 05/01/24 1719  BP: (!) 160/103 (!) 204/71 (!) 171/63 (!) 178/72  Pulse: 77 73 (!) 57   Resp:      Temp: 98.3 F (36.8 C) 98.1 F (36.7 C)    TempSrc: Oral Oral    SpO2:  99% 96%    Weight:      Height:       General exam: Alert, awake, feeling better and expressing improvement in her breathing.  Less swelling in her legs and good urine output  reported. Respiratory system: Fine crackles appreciated at the bases; no tachypnea.  Improved air movement bilaterally. Cardiovascular system:RRR. No murmurs, rubs, gallops. Gastrointestinal system: Abdomen is nondistended, soft and nontender.  Positive bowel sounds.  Increased abdominal girth on exam. Central nervous system: Alert and oriented. No focal neurological deficits. Extremities: No cyanosis or clubbing; 1+ edema appreciated bilaterally. Skin: No petechiae; unstageable sacral pressure injury present at time of admission; no signs of superimposed infection.  Protective dressing in place at time of exam. Psychiatry: Judgement and insight appear normal. Mood & affect appropriate.   Latest data Reviewed: Basic metabolic panel: Sodium 135, potassium 3.2, chloride 94, bicarb 30, BUN 69, creatinine 3.04 GFR 14 CBC: White blood cell 7.0, hemoglobin 8.3 and platelet count 266K.  Family Communication: No family at bedside.  Disposition: Status is: Inpatient Remains inpatient appropriate because: Continue IV diuresis.  Dissipating discharge plan home once medically stable.  Time spent: 50 minutes  Author: Eric Nunnery, MD 05/01/2024 5:29 PM  For on call review [email or web address].

## 2024-05-01 NOTE — Plan of Care (Signed)
  Problem: Tissue Perfusion: Goal: Adequacy of tissue perfusion will improve Outcome: Progressing   Problem: Activity: Goal: Risk for activity intolerance will decrease Outcome: Progressing   Problem: Nutrition: Goal: Adequate nutrition will be maintained Outcome: Progressing

## 2024-05-02 DIAGNOSIS — R601 Generalized edema: Secondary | ICD-10-CM | POA: Diagnosis not present

## 2024-05-02 DIAGNOSIS — I5033 Acute on chronic diastolic (congestive) heart failure: Secondary | ICD-10-CM | POA: Diagnosis not present

## 2024-05-02 LAB — GLUCOSE, CAPILLARY
Glucose-Capillary: 322 mg/dL — ABNORMAL HIGH (ref 70–99)
Glucose-Capillary: 374 mg/dL — ABNORMAL HIGH (ref 70–99)
Glucose-Capillary: 384 mg/dL — ABNORMAL HIGH (ref 70–99)
Glucose-Capillary: 297 mg/dL — ABNORMAL HIGH (ref 70–99)
Glucose-Capillary: 174 mg/dL — ABNORMAL HIGH (ref 70–99)

## 2024-05-02 LAB — BASIC METABOLIC PANEL WITH GFR
Anion gap: 12 (ref 5–15)
BUN: 78 mg/dL — ABNORMAL HIGH (ref 8–23)
CO2: 28 mmol/L (ref 22–32)
Calcium: 8.3 mg/dL — ABNORMAL LOW (ref 8.9–10.3)
Chloride: 95 mmol/L — ABNORMAL LOW (ref 98–111)
Creatinine, Ser: 3.29 mg/dL — ABNORMAL HIGH (ref 0.44–1.00)
GFR, Estimated: 13 mL/min — ABNORMAL LOW (ref >60)
Glucose, Bld: 329 mg/dL — ABNORMAL HIGH (ref 70–99)
Potassium: 3.2 mmol/L — ABNORMAL LOW (ref 3.5–5.1)
Sodium: 135 mmol/L (ref 135–145)

## 2024-05-02 MED ORDER — INSULIN GLARGINE-YFGN 100 UNIT/ML ~~LOC~~ SOLN
3.0000 [IU] | Freq: Every day | SUBCUTANEOUS | Status: DC
  Administered 2024-05-02 – 2024-05-03 (×2): 3 [IU] via SUBCUTANEOUS
  Filled 2024-05-02 (×3): qty 0.03

## 2024-05-02 MED ORDER — POTASSIUM CHLORIDE CRYS ER 20 MEQ PO TBCR
40.0000 meq | EXTENDED_RELEASE_TABLET | Freq: Once | ORAL | Status: AC
  Administered 2024-05-02: 40 meq via ORAL
  Filled 2024-05-02: qty 2

## 2024-05-02 MED ORDER — FUROSEMIDE 10 MG/ML IJ SOLN
40.0000 mg | Freq: Every day | INTRAMUSCULAR | Status: DC
  Administered 2024-05-03 – 2024-05-04 (×2): 40 mg via INTRAVENOUS
  Filled 2024-05-02 (×2): qty 4

## 2024-05-02 NOTE — Progress Notes (Signed)
 Progress Note   Patient: Emily Jennings FMW:[id number] DOB: Jun 29, 1934 DOA: 04/29/2024     3 DOS: the patient was seen and examined on 05/02/2024   Brief hospital admission narrative: As per H&P written by Dr. Dorinda on 04/29/2024 Emily Jennings is a 89 y.o. female with medical history significant of anxiety disorder, HFpEF, previous history of breast cancer, coronary artery disease, diabetes mellitus type 2, hypertension, GERD who was recently admitted here from 04/07/2024 to 04/21/2024 for acute on chronic HFpEF comes in again today with complaints of progressive worsening lower extremity swelling as well as shortness of breath.  During her recent admission patient was seen by palliative medicine with plans to continue with diuresis as long as it is working but no plans for hemodialysis and if patient becomes unresponsive to diuresis to consider transitioning to comfort care measures. Shortness of breath became more pronounced within the last 2 weeks with associated worsening of bilateral lower extremity swelling. She denies chest pain, nausea vomiting, abdominal pain or urinary complaints.   ED course Upon arrival to the emergency room temperature 97.6, respiratory rate 17, pulse 69, BP 176/66 saturating 93% on room air.  BNP greater than 2000, chest x-ray showing cardiomegaly as well as small bilateral pleural effusion.  Troponin 45 with repeat of 42.  Given above findings hospitalist service was contacted to admit patient for further management  Assessment and plan 1-acute on chronic diastolic heart failure - Continue IV diuresis - Follow daily weights, strict I's and O's and low-sodium diet - Apply TED hoses - Check REDs clip measurement -Follow clinical response. - Recent echo in May demonstrating ejection fraction 70 to 75% without wall motion abnormality; will not repeat.  2-hypokalemia - Continue electrolyte repletion and follow trend - Continue telemetry monitoring. - Magnesium  within  normal limits.  3-chronic kidney disease stage IV - Creatinine appears to be close to baseline at the moment - Continue close monitoring of renal function especially while actively diuresing patient - Continue to follow volume status and urine output response.  4-type 2 diabetes mellitus with nephropathy - Continue adjusted dose of sliding scale insulin  - Patient CBGs ranging up to 320; low-dose Semglee  daily will be added. - Patient advised not to skip any meals - Continue to follow CBG fluctuation - No further episodes of hypoglycemia appreciated.  5-chronic anemia - No overt bleeding appreciated - Follow hemoglobin trend.  6-unstageable sacral wound present at time of admission - No signs of superimposed infection; continue consult repositioning and local care.  7-essential hypertension - Continue treatment with Coreg , Imdur , hydralazine , clonidine  and IV Lasix  at the moment - Follow-up blood pressure stability and will adjust medications as needed.  8-history of coronary artery disease - Chest pain-free currently - Continue medical therapy - Continue patient follow-up with cardiology service.  9-history of breast cancer - Appears to be in remission - Continue outpatient follow-up with oncology service  10-anxiety disorder - Continue supportive care - Patient currently not taking any anxiolytic therapy prior to admission.   Subjective:  No fever, no chest pain, no nausea or vomiting.  Patient no using accessory muscles on exam.  2 L nasal cannula in place.  Reported good urine output and overall improvement in swelling.  Physical Exam: Vitals:   05/01/24 2211 05/02/24 0042 05/02/24 0443 05/02/24 1631  BP: (!) 206/80 (!) 191/67 (!) 188/63 (!) 180/62  Pulse: 62 63 68 (!) 58  Resp:      Temp: (!) 97.5 F (36.4 C) 97.7  F (36.5 C) 98.4 F (36.9 C) 97.8 F (36.6 C)  TempSrc: Oral  Oral   SpO2: 98% 99%  96%  Weight:   53.1 kg   Height:       General exam: Alert,  awake and following commands appropriately.  Denies chest pain, no nausea, no vomiting, and no palpitations.  Still short winded with activity and using 2 L supplementation with good saturation on exam. Respiratory system: Decreased breath sounds at the bases; no wheezing, no using accessory muscle. Cardiovascular system: Rate controlled, no rubs,, no JVD Gastrointestinal system: Abdomen is nondistended, soft and nontender. No organomegaly or masses felt. Normal bowel sounds heard.  Positive increased abdominal girth on exam (better than on previous examination). Central nervous system: Generally weak; no focal neurological deficits.  Moving 4 limbs spontaneously. Extremities: No cyanosis or clubbing; trace to 1+ edema appreciated bilaterally. Skin: No petechiae; unstageable sacral pressure injury present at time of admission without signs of superimposed infection.  Dressing in place. Psychiatry: Judgement and insight appear normal. Mood & affect appropriate.   Latest data Reviewed: Basic metabolic panel: Sodium 135, potassium 3.2, chloride 95, bicarb 28, glucose 329, BUN 78, creatinine 3.21 GFR 13 CBC: White blood cell 7.0, hemoglobin 8.3 and platelet count 266K.  Family Communication: No family at bedside.  Disposition: Status is: Inpatient Remains inpatient appropriate because: Continue IV diuresis.  Dissipating discharge plan home once medically stable.  Time spent: 50 minutes  Author: Eric Nunnery, MD 05/02/2024 4:52 PM  For on call review [email or web address].

## 2024-05-02 NOTE — Plan of Care (Signed)
  Problem: Coping: Goal: Ability to adjust to condition or change in health will improve Outcome: Not Met (add Reason)   Problem: Fluid Volume: Goal: Ability to maintain a balanced intake and output will improve Outcome: Not Met (add Reason)   Problem: Health Behavior/Discharge Planning: Goal: Ability to manage health-related needs will improve Outcome: Not Met (add Reason)   Problem: Metabolic: Goal: Ability to maintain appropriate glucose levels will improve Outcome: Not Met (add Reason)

## 2024-05-02 NOTE — Inpatient Diabetes Management (Signed)
 Inpatient Diabetes Program Recommendations  AACE/ADA: New Consensus Statement on Inpatient Glycemic Control (2015)  Target Ranges:  Prepandial:   less than 140 mg/dL      Peak postprandial:   less than 180 mg/dL (1-2 hours)      Critically ill patients:  140 - 180 mg/dL   Lab Results  Component Value Date   GLUCAP 374 (H) 05/02/2024   HGBA1C 7.4 (H) 02/05/2024    Review of Glycemic Control  Latest Reference Range & Units 04/30/24 21:13 05/01/24 07:14 05/01/24 11:14 05/01/24 16:25 05/01/24 19:24 05/02/24 04:38 05/02/24 07:17  Glucose-Capillary 70 - 99 mg/dL 773 (H) 676 (H) 695 (H) 180 (H) 202 (H) 322 (H) 374 (H)  (H): Data is abnormally high Diabetes history: DM2 Outpatient Diabetes medications: Tresiba  6 units at bedtime, Humalog  0-12 units TID with meals Current orders for Inpatient glycemic control: Novolog  0-6 units TID with meals    Inpatient Diabetes Program Recommendations:     CBG this morning is 374 mg/dl. Consider ordering Semglee  4 units Q24H starting now.    Thanks, Tinnie Minus, MSN, RNC-OB Diabetes Coordinator [phone] (8a-5p)

## 2024-05-02 NOTE — Care Management Important Message (Signed)
 Important Message  Patient Details  Name: Emily Jennings MRN: [id number] Date of Birth: [birth year]   Important Message Given:  Yes - Medicare IM     Shevy Yaney L Suhaas [employer] 05/02/2024, 12:29 PM

## 2024-05-02 NOTE — Progress Notes (Addendum)
 Physical Therapy Treatment Patient Details Name: Emily Jennings MRN: [id number] DOB: [birth year] Today's Date: 05/02/2024   History of Present Illness Emily Jennings is a 89 y.o. female with medical history significant of anxiety disorder, HFpEF, previous history of breast cancer, coronary artery disease, diabetes mellitus type 2, hypertension, GERD who was recently admitted here from 04/07/2024 to 04/21/2024 for acute on chronic HFpEF comes in again today with complaints of progressive worsening lower extremity swelling as well as shortness of breath.  During her recent admission patient was seen by palliative medicine with plans to continue with diuresis as long as it is working but no plans for hemodialysis and if patient becomes unresponsive to diuresis to consider transitioning to comfort care measures. Shortness of breath became more pronounced within the last 2 weeks with associated worsening of bilateral lower extremity swelling.  She denies chest pain, nausea vomiting, abdominal pain or urinary complaints.    PT Comments  Patient tolerated PT treatment overall well, although limited some due to pain. Upon arrival for session, nursing made PT aware that pt had just been placed back in bed after having sat in recliner. Patient agreeable to UE/LE exercises in bed. Pt in semi-reclined position throughout session. Performs ankle pumps, SAQ, and all UE exercises well with tactile and visual cueing. Pt required some assist during heel slides due to pain on bottom. Educated pt and husband on pressure relief. Min/mod assist to roll to place pillows under pt bottom. Max assist for boosting in bed with assist of nursing staff. Pt given print out of seated LE exercises. Pt left in bed with call button within reach, bed alarm set, and husband present. Patient will benefit from continued skilled physical therapy acutely and in recommended venue in order to address current deficits and improve function.     If plan is  discharge home, recommend the following: Help with stairs or ramp for entrance;Assist for transportation;Assistance with cooking/housework;A lot of help with bathing/dressing/bathroom;A lot of help with walking and/or transfers   Can travel by private vehicle     No  Equipment Recommendations  None recommended by PT    Recommendations for Other Services       Precautions / Restrictions Precautions Precautions: Fall Recall of Precautions/Restrictions: Impaired Restrictions Weight Bearing Restrictions Per Provider Order: No     Mobility  Bed Mobility Overal bed mobility: Needs Assistance Bed Mobility: Rolling Rolling: Mod assist, Min assist         General bed mobility comments: Pt req min/mod assist with rolling R/L with use of railings to place pillows under bottom for pressure relief. Max assist for boosting to [facility] with assist from nursing secondary to sacral pressure wound    Transfers     General transfer comment: Upon arrival for PT session, nursing had just placed pt in bed from sitting in chair. deferred for todays session    Ambulation/Gait   General Gait Details: Upon arrival for PT session, nursing had just placed pt in bed from sitting in chair. deferred for todays session   Stairs       Wheelchair Mobility     Tilt Bed    Modified Rankin (Stroke Patients Only)       Balance                                            Communication Communication  Communication: No apparent difficulties  Cognition Arousal: Alert Behavior During Therapy: WFL for tasks assessed/performed     Following commands: Intact Following commands impaired: Follows one step commands inconsistently    Cueing Cueing Techniques: Verbal cues, Tactile cues, Visual cues  Exercises General Exercises - Upper Extremity Shoulder Flexion: AROM, Self ROM, Strengthening, Both, 10 reps, Other (comment), Supine Elbow Flexion: AROM, Self ROM, Strengthening, Both,  10 reps, Supine Elbow Extension: AROM, Self ROM, Strengthening, Both, 10 reps, Supine General Exercises - Lower Extremity Ankle Circles/Pumps: AROM, Strengthening, Both, 10 reps, Supine Short Arc Quad: AROM, Strengthening, Both, 10 reps, Supine Heel Slides: AROM, AAROM, Strengthening, Both, 10 reps, Supine    General Comments        Pertinent Vitals/Pain Pain Assessment Pain Assessment: Faces Faces Pain Scale: Hurts even more Pain Location: gluteal pain Pain Descriptors / Indicators: Discomfort Pain Intervention(s): Repositioned, Limited activity within patient's tolerance    Home Living                          Prior Function            PT Goals (current goals can now be found in the care plan section) Acute Rehab PT Goals Patient Stated Goal: return home PT Goal Formulation: With patient Time For Goal Achievement: 05/14/24 Potential to Achieve Goals: Good Progress towards PT goals: Progressing toward goals    Frequency    Min 3X/week      PT Plan      Co-evaluation              AM-PAC PT 6 Clicks Mobility   Outcome Measure  Help needed turning from your back to your side while in a flat bed without using bedrails?: A Little Help needed moving from lying on your back to sitting on the side of a flat bed without using bedrails?: A Little Help needed moving to and from a bed to a chair (including a wheelchair)?: A Lot Help needed standing up from a chair using your arms (e.g., wheelchair or bedside chair)?: A Lot Help needed to walk in hospital room?: A Little Help needed climbing 3-5 steps with a railing? : Total 6 Click Score: 14    End of Session Equipment Utilized During Treatment: Oxygen Activity Tolerance: Patient limited by pain;Patient tolerated treatment well Patient left: in bed;with call bell/phone within reach;with bed alarm set;with nursing/sitter in room;with family/visitor present Nurse Communication: Mobility status PT  Visit Diagnosis: Unsteadiness on feet (R26.81);Other abnormalities of gait and mobility (R26.89);Muscle weakness (generalized) (M62.81)     Time: 8659-8643 PT Time Calculation (min) (ACUTE ONLY): 16 min  Charges:    $Therapeutic Exercise: 8-22 mins PT General Charges $$ ACUTE PT VISIT: 1 Visit                     3:36 PM, 05/02/24 Tationna Fullard Powell-Butler, PT, DPT [place] with [facility]

## 2024-05-02 NOTE — Plan of Care (Signed)
   Problem: Nutritional: Goal: Maintenance of adequate nutrition will improve Outcome: Progressing   Problem: Skin Integrity: Goal: Risk for impaired skin integrity will decrease Outcome: Progressing   Problem: Tissue Perfusion: Goal: Adequacy of tissue perfusion will improve Outcome: Progressing

## 2024-05-02 NOTE — Progress Notes (Addendum)
   05/01/24 2211  Assess: MEWS Score  Temp (!) 97.5 F (36.4 C)  BP (!) 206/80  MAP (mmHg) 116  Pulse Rate 62  Level of Consciousness Alert  SpO2 98 %  O2 Device Nasal Cannula  O2 Flow Rate (L/min) 2.5 L/min  Assess: MEWS Score  MEWS Temp 0  MEWS Systolic 2  MEWS Pulse 0  MEWS RR 0  MEWS LOC 0  MEWS Score 2  MEWS Score Color Yellow  Assess: if the MEWS score is Yellow or Red  Were vital signs accurate and taken at a resting state? Yes  Does the patient meet 2 or more of the SIRS criteria? No  MEWS guidelines implemented  No, previously yellow, continue vital signs every 4 hours  Notify: Charge Nurse/RN  Name of Charge Nurse/RN Notified Earla Gully, RN  Provider Notification  Provider Name/Title Dr Charlton  Date Provider Notified 05/01/24  Time Provider Notified 2320  Method of Notification  (Secure Chat)  Notification Reason  (Yellow Mews)  Provider response No new orders (IV Labetalol  given.)  Assess: SIRS CRITERIA  SIRS Temperature  0  SIRS Respirations  0  SIRS Pulse 0  SIRS WBC 0  SIRS Score Sum  0   IV Labetalol  given at 2220.

## 2024-05-03 DIAGNOSIS — R601 Generalized edema: Secondary | ICD-10-CM | POA: Diagnosis not present

## 2024-05-03 DIAGNOSIS — I5033 Acute on chronic diastolic (congestive) heart failure: Secondary | ICD-10-CM | POA: Diagnosis not present

## 2024-05-03 LAB — GLUCOSE, CAPILLARY
Glucose-Capillary: 229 mg/dL — ABNORMAL HIGH (ref 70–99)
Glucose-Capillary: 234 mg/dL — ABNORMAL HIGH (ref 70–99)
Glucose-Capillary: 241 mg/dL — ABNORMAL HIGH (ref 70–99)
Glucose-Capillary: 279 mg/dL — ABNORMAL HIGH (ref 70–99)
Glucose-Capillary: 299 mg/dL — ABNORMAL HIGH (ref 70–99)
Glucose-Capillary: 364 mg/dL — ABNORMAL HIGH (ref 70–99)
Glucose-Capillary: 391 mg/dL — ABNORMAL HIGH (ref 70–99)

## 2024-05-03 LAB — BASIC METABOLIC PANEL WITH GFR
Anion gap: 15 (ref 5–15)
BUN: 81 mg/dL — ABNORMAL HIGH (ref 8–23)
CO2: 28 mmol/L (ref 22–32)
Calcium: 8.5 mg/dL — ABNORMAL LOW (ref 8.9–10.3)
Chloride: 92 mmol/L — ABNORMAL LOW (ref 98–111)
Creatinine, Ser: 3.45 mg/dL — ABNORMAL HIGH (ref 0.44–1.00)
GFR, Estimated: 12 mL/min — ABNORMAL LOW (ref >60)
Glucose, Bld: 377 mg/dL — ABNORMAL HIGH (ref 70–99)
Potassium: 3.5 mmol/L (ref 3.5–5.1)
Sodium: 135 mmol/L (ref 135–145)

## 2024-05-03 NOTE — Progress Notes (Signed)
 MEWS Progress Note  Patient Details Name: Emily Jennings MRN: [id number] DOB: 01-Jan-1934 Today's Date: 05/03/2024   MEWS Flowsheet Documentation:  Assess: MEWS Score Temp: 97.7 F (36.5 C) BP: (!) 199/102 MAP (mmHg): 131 Pulse Rate: 64 ECG Heart Rate: 65 Resp: 20 Level of Consciousness: Alert SpO2: 97 % O2 Device: Nasal Cannula Patient Activity (if Appropriate): In bed O2 Flow Rate (L/min): 2 L/min FiO2 (%): 21 % Assess: MEWS Score MEWS Temp: 0 MEWS Systolic: 0 MEWS Pulse: 0 MEWS RR: 0 MEWS LOC: 0 MEWS Score: 0 MEWS Score Color: Green Assess: SIRS CRITERIA SIRS Temperature : 0 SIRS Respirations : 0 SIRS Pulse: 0 SIRS WBC: 0 SIRS Score Sum : 0 SIRS Temperature : 0 SIRS Pulse: 0 SIRS Respirations : 0 SIRS WBC: 0 SIRS Score Sum : 0 Provider Notification Provider Name/Title: Marissa Ely, MD Date Provider Notified: 05/03/24 Time Provider Notified: 0430 Method of Notification: Page Notification Reason: Other (Comment) (Elevated BP Greater than 180) Provider response: Other (Comment), No new orders (IV Labetalol ) Date of Provider Response: 05/03/24      [name] 05/03/2024, 4:34 AM

## 2024-05-03 NOTE — Progress Notes (Signed)
 MEWS Progress Note  Patient Details Name: Emily Jennings MRN: [id number] DOB: 1933-11-17 Today's Date: 05/03/2024   MEWS Flowsheet Documentation:  Assess: MEWS Score Temp: 97.7 F (36.5 C) BP: (!) 228/75 MAP (mmHg): 131 Pulse Rate: 64 ECG Heart Rate: 65 Resp: 20 Level of Consciousness: Alert SpO2: 97 % O2 Device: Nasal Cannula Patient Activity (if Appropriate): In bed O2 Flow Rate (L/min): 2 L/min FiO2 (%): 21 % Assess: MEWS Score MEWS Temp: 0 MEWS Systolic: 2 MEWS Pulse: 0 MEWS RR: 0 MEWS LOC: 0 MEWS Score: 2 MEWS Score Color: Yellow Assess: SIRS CRITERIA SIRS Temperature : 0 SIRS Respirations : 0 SIRS Pulse: 0 SIRS WBC: 0 SIRS Score Sum : 0 SIRS Temperature : 0 SIRS Pulse: 0 SIRS Respirations : 0 SIRS WBC: 0 SIRS Score Sum : 0 Assess: if the MEWS score is Yellow or Red Were vital signs accurate and taken at a resting state?: Yes Does the patient meet 2 or more of the SIRS criteria?: No MEWS guidelines implemented : Yes, yellow Treat MEWS Interventions: Considered administering scheduled or prn medications/treatments as ordered Take Vital Signs Increase Vital Sign Frequency : Yellow: Q2hr x1, continue Q4hrs until patient remains green for 12hrs Escalate MEWS: Escalate: Yellow: Discuss with charge nurse and consider notifying provider and/or RRT Notify: Charge Nurse/RN Name of Charge Nurse/RN Notified: Metta Sar, RN Provider Notification Provider Name/Title: Marissa Ely, MD Date Provider Notified: 05/03/24 Time Provider Notified: [phone] Method of Notification: Page Notification Reason: Other (Comment) (Continuous Elevated BP Greater than 200) Provider response: No new orders, Other (Comment) (Instructed by provider to give ten am cardiac meds) Date of Provider Response: 05/03/24 Time of Provider Response: 0636      [name] 05/03/2024, 6:39 AM

## 2024-05-03 NOTE — Plan of Care (Signed)

## 2024-05-03 NOTE — Progress Notes (Signed)
 SATURATION QUALIFICATIONS: (This note is used to comply with regulatory documentation for home oxygen)  Patient Saturations on Room Air at Rest = 96%  Patient Saturations on Room Air while Ambulating = 98%  Patient Saturations on 1 Liters of oxygen while Ambulating = 99%  Please briefly explain why patient needs home oxygen: no oxygen needed.  Pt able to walk 15 ft total with FWW and standby assist, tolerated well.

## 2024-05-03 NOTE — Plan of Care (Signed)
  Problem: Metabolic: Goal: Ability to maintain appropriate glucose levels will improve Outcome: Progressing   Problem: Skin Integrity: Goal: Risk for impaired skin integrity will decrease Outcome: Progressing   Problem: Tissue Perfusion: Goal: Adequacy of tissue perfusion will improve Outcome: Progressing   Problem: Health Behavior/Discharge Planning: Goal: Ability to manage health-related needs will improve Outcome: Progressing   Problem: Clinical Measurements: Goal: Will remain free from infection Outcome: Progressing Goal: Respiratory complications will improve Outcome: Progressing

## 2024-05-03 NOTE — Progress Notes (Signed)
 Progress Note   Patient: Emily Jennings FMW:[id number] DOB: 1934-01-18 DOA: 04/29/2024     4 DOS: the patient was seen and examined on 05/03/2024   Brief hospital admission narrative: As per H&P written by Dr. Dorinda on 04/29/2024 Emily Jennings is a 89 y.o. female with medical history significant of anxiety disorder, HFpEF, previous history of breast cancer, coronary artery disease, diabetes mellitus type 2, hypertension, GERD who was recently admitted here from 04/07/2024 to 04/21/2024 for acute on chronic HFpEF comes in again today with complaints of progressive worsening lower extremity swelling as well as shortness of breath.  During her recent admission patient was seen by palliative medicine with plans to continue with diuresis as long as it is working but no plans for hemodialysis and if patient becomes unresponsive to diuresis to consider transitioning to comfort care measures. Shortness of breath became more pronounced within the last 2 weeks with associated worsening of bilateral lower extremity swelling. She denies chest pain, nausea vomiting, abdominal pain or urinary complaints.   ED course Upon arrival to the emergency room temperature 97.6, respiratory rate 17, pulse 69, BP 176/66 saturating 93% on room air.  BNP greater than 2000, chest x-ray showing cardiomegaly as well as small bilateral pleural effusion.  Troponin 45 with repeat of 42.  Given above findings hospitalist service was contacted to admit patient for further management  Assessment and plan 1-acute on chronic diastolic heart failure - Continue IV diuresis - Follow daily weights, strict I's and O's and low-sodium diet - Apply TED hoses - Check REDs clip measurement -Follow clinical response. - Recent echo in May demonstrating ejection fraction 70 to 75% without wall motion abnormality; will not repeat.  2-hypokalemia - Continue electrolyte repletion and follow trend - Continue telemetry monitoring. - Magnesium  within  normal limits.  3-chronic kidney disease stage IV - Creatinine appears to be close to baseline at the moment - Continue close monitoring of renal function especially while actively diuresing patient - Continue to follow volume status and urine output response.  4-type 2 diabetes mellitus with nephropathy - Continue adjusted dose of sliding scale insulin  - Patient CBGs ranging up to 320; low-dose Semglee  daily will be added. - Patient advised not to skip any meals - Continue to follow CBG fluctuation - No further episodes of hypoglycemia appreciated.  5-chronic anemia - No overt bleeding appreciated - Follow hemoglobin trend.  6-unstageable sacral wound present at time of admission - No signs of superimposed infection; continue consult repositioning and local care.  7-essential hypertension - Continue treatment with Coreg , Imdur , hydralazine , clonidine  and IV Lasix  at the moment - Follow-up blood pressure stability and will adjust medications as needed.  8-history of coronary artery disease - Chest pain-free currently - Continue medical therapy - Continue patient follow-up with cardiology service.  9-history of breast cancer - Appears to be in remission - Continue outpatient follow-up with oncology service  10-anxiety disorder - Continue supportive care - Patient currently not taking any anxiolytic therapy prior to admission.   Subjective:  No fever, no chest pain, no nausea vomiting.  3 L nasal cannula placed.  Still demonstrating pedal edema and increased abdominal girth.  Patient reports feeling short of breath with exertion.  Physical Exam: Vitals:   05/03/24 0627 05/03/24 1006 05/03/24 1134 05/03/24 1338  BP: (!) 228/75 (!) 194/68 (!) 191/71 (!) 205/70  Pulse:  65  66  Resp:      Temp:  97.8 F (36.6 C)  (!) 97.3 F (  36.3 C)  TempSrc:  Oral  Oral  SpO2:  93%  98%  Weight:      Height:       General exam: Alert, awake, oriented x 3; still complaining of  shortness of breath with activity.  No nausea, no vomiting, no chest pain. Respiratory system: 2 L nasal cannula in place; oxygen saturation 97%.  Decreased breath sounds at the bases without frank crackles.  No wheezing. Cardiovascular system:RRR. No rubs or gallops; no JVD. Gastrointestinal system: Abdomen is nondistended, soft and nontender. No organomegaly or masses felt. Normal bowel sounds heard. Central nervous system: Alert and oriented. No focal neurological deficits. Extremities: No cyanosis or clubbing; trace to 1+ edema appreciated bilaterally. Skin: No petechiae.  Unstageable sacral pressure injury present at time of admission without signs of superimposed infection. Psychiatry: Judgement and insight appear normal. Mood & affect appropriate.   Latest data Reviewed: Basic metabolic panel: Sodium 135, potassium 3.5, chloride 92, bicarb 28, glucose 377, BUN 81, creatinine 3.45 and GFR 12. CBC: White blood cell 7.0, hemoglobin 8.3 and platelet count 266K.  Family Communication: Cousin at bedside.  Disposition: Status is: Inpatient Remains inpatient appropriate because: Continue IV diuresis.  Dissipating discharge plan home once medically stable.  Time spent: 50 minutes  Author: Eric Nunnery, MD 05/03/2024 3:50 PM  For on call review [email or web address].

## 2024-05-03 NOTE — Progress Notes (Signed)
 MEWS Progress Note  Patient Details Name: Emily Jennings MRN: [id number] DOB: 28-May-1934 Today's Date: 05/03/2024   MEWS Flowsheet Documentation:  Assess: MEWS Score Temp: 97.7 F (36.5 C) BP: (!) 228/75 MAP (mmHg): 131 Pulse Rate: 64 ECG Heart Rate: 65 Resp: 20 Level of Consciousness: Alert SpO2: 97 % O2 Device: Nasal Cannula Patient Activity (if Appropriate): In bed O2 Flow Rate (L/min): 2 L/min FiO2 (%): 21 % Assess: MEWS Score MEWS Temp: 0 MEWS Systolic: 2 MEWS Pulse: 0 MEWS RR: 0 MEWS LOC: 0 MEWS Score: 2 MEWS Score Color: Yellow Assess: SIRS CRITERIA SIRS Temperature : 0 SIRS Respirations : 0 SIRS Pulse: 0 SIRS WBC: 0 SIRS Score Sum : 0 SIRS Temperature : 0 SIRS Pulse: 0 SIRS Respirations : 0 SIRS WBC: 0 SIRS Score Sum : 0 Provider Notification Provider Name/Title: Marissa Ely, MD Date Provider Notified: 05/03/24 Time Provider Notified: [id number] Method of Notification: Page Notification Reason: Other (Comment) (Continuous Elevated BP Greater than 200) Provider response: No new orders, Other (Comment) (Instructed by provider to give ten am cardiac meds) Date of Provider Response: 05/03/24 Time of Provider Response: 0636      [name] 05/03/2024, 6:38 AM

## 2024-05-04 DIAGNOSIS — R601 Generalized edema: Secondary | ICD-10-CM | POA: Diagnosis not present

## 2024-05-04 DIAGNOSIS — I509 Heart failure, unspecified: Secondary | ICD-10-CM

## 2024-05-04 DIAGNOSIS — R5381 Other malaise: Secondary | ICD-10-CM | POA: Diagnosis not present

## 2024-05-04 DIAGNOSIS — E876 Hypokalemia: Secondary | ICD-10-CM

## 2024-05-04 DIAGNOSIS — N184 Chronic kidney disease, stage 4 (severe): Secondary | ICD-10-CM

## 2024-05-04 DIAGNOSIS — I5033 Acute on chronic diastolic (congestive) heart failure: Secondary | ICD-10-CM | POA: Diagnosis not present

## 2024-05-04 LAB — BASIC METABOLIC PANEL WITH GFR
Anion gap: 9 (ref 5–15)
BUN: 82 mg/dL — ABNORMAL HIGH (ref 8–23)
CO2: 31 mmol/L (ref 22–32)
Calcium: 8.3 mg/dL — ABNORMAL LOW (ref 8.9–10.3)
Chloride: 95 mmol/L — ABNORMAL LOW (ref 98–111)
Creatinine, Ser: 3.38 mg/dL — ABNORMAL HIGH (ref 0.44–1.00)
GFR, Estimated: 13 mL/min — ABNORMAL LOW (ref >60)
Glucose, Bld: 167 mg/dL — ABNORMAL HIGH (ref 70–99)
Potassium: 3.1 mmol/L — ABNORMAL LOW (ref 3.5–5.1)
Sodium: 135 mmol/L (ref 135–145)

## 2024-05-04 LAB — GLUCOSE, CAPILLARY
Glucose-Capillary: 154 mg/dL — ABNORMAL HIGH (ref 70–99)
Glucose-Capillary: 147 mg/dL — ABNORMAL HIGH (ref 70–99)
Glucose-Capillary: 230 mg/dL — ABNORMAL HIGH (ref 70–99)
Glucose-Capillary: 240 mg/dL — ABNORMAL HIGH (ref 70–99)

## 2024-05-04 MED ORDER — COLLAGENASE 250 UNIT/GM EX OINT: TOPICAL_OINTMENT | Freq: Every day | CUTANEOUS | 0 refills | Status: DC

## 2024-05-04 MED ORDER — TORSEMIDE 20 MG PO TABS: 20.0000 mg | ORAL_TABLET | Freq: Two times a day (BID) | ORAL | 2 refills | Status: DC

## 2024-05-04 MED ORDER — POTASSIUM CHLORIDE CRYS ER 20 MEQ PO TBCR
40.0000 meq | EXTENDED_RELEASE_TABLET | Freq: Once | ORAL | Status: AC
  Administered 2024-05-04: 40 meq via ORAL
  Filled 2024-05-04: qty 2

## 2024-05-04 NOTE — Progress Notes (Signed)
 Called and left a message for Emily Jennings to give me a call back concerning discharge.

## 2024-05-04 NOTE — Discharge Summary (Signed)
 Physician Discharge Summary   Patient: Emily Jennings MRN: [id number] DOB: 07-29-1934  Admit date:     04/29/2024  Discharge date: 05/04/24  Discharge Physician: Eric Nunnery   PCP: Antonetta Rollene BRAVO, MD   Recommendations at discharge:  Repeat basic metabolic panel to evaluate electrolytes and renal function. Repeat CBC to follow Hgb trend/stability  Close follow-up patient CBG/A1c with further adjustment to hypoglycemia regimen as required. Reassess patient blood pressure/volume status and further adjust medications as needed.  Discharge Diagnoses: Principal Problem:   (HFpEF) heart failure with preserved ejection fraction (HCC) Active Problems:   CKD (chronic kidney disease) stage 4, GFR 15-29 ml/min [facility])   Physical deconditioning   Anasarca  Brief Hospital admission narrative: As per H&P written by Dr. Dorinda on 04/29/2024 Emily Jennings is a 89 y.o. female with medical history significant of anxiety disorder, HFpEF, previous history of breast cancer, coronary artery disease, diabetes mellitus type 2, hypertension, GERD who was recently admitted here from 04/07/2024 to 04/21/2024 for acute on chronic HFpEF comes in again today with complaints of progressive worsening lower extremity swelling as well as shortness of breath.  During her recent admission patient was seen by palliative medicine with plans to continue with diuresis as long as it is working but no plans for hemodialysis and if patient becomes unresponsive to diuresis to consider transitioning to comfort care measures. Shortness of breath became more pronounced within the last 2 weeks with associated worsening of bilateral lower extremity swelling. She denies chest pain, nausea vomiting, abdominal pain or urinary complaints.   ED course Upon arrival to the emergency room temperature 97.6, respiratory rate 17, pulse 69, BP 176/66 saturating 93% on room air.  BNP greater than 2000, chest x-ray showing cardiomegaly as well as small  bilateral pleural effusion.  Troponin 45 with repeat of 42.  Given above findings hospitalist service was contacted to admit patient for further management  Assessment and Plan: 1-acute on chronic diastolic heart failure - Discharged home on torsemide  20 mg twice a day - Follow daily weights adequate hydration and low-sodium diet. - Continue the use of TED hoses. -Follow volume status and clinical response with further adjustment of diuretics as needed. - Recent echo in May demonstrating ejection fraction 70 to 75% without wall motion abnormality; will not repeat during this admission. -Overall further GDMT management restricted given patient's renal failure.   2-hypokalemia - Continue electrolyte repletion and follow trend - Patient has been discharged on daily maintenance supplementation of potassium.   3-chronic kidney disease stage IV - Creatinine appears to be close to baseline at the moment - Continue close monitoring of renal function with recent adjustment to diuretic regimen. - Continue to follow volume status and urine output response.   4-type 2 diabetes mellitus with nephropathy - Continue to follow CBG fluctuation and further adjust hyperglycemia regimen as required -Resume home hypoglycemic regimen.   5-chronic anemia - No overt bleeding appreciated - Follow hemoglobin trend.   6-unstageable sacral wound present at time of admission - No signs of superimposed infection; continue consult repositioning and local care.   7-essential hypertension - Continue treatment with Coreg , Imdur , hydralazine , clonidine  and torsemide   - continue to Follow-up blood pressure stability and will adjust medications as needed.   8-history of coronary artery disease - Chest pain-free currently - Continue medical therapy - Continue outpatient follow-up with cardiology service.   9-history of breast cancer - Appears to be in remission - Continue outpatient follow-up with oncology  service  10-anxiety disorder - Continue supportive care - Patient currently not taking any anxiolytic therapy prior to admission.  Consultants: None Procedures performed: See below for x-ray report. Disposition: Home Diet recommendation: Heart healthy/low-sodium, modify carbohydrates diet.  DISCHARGE MEDICATION: Allergies as of 05/04/2024       Reactions   Farxiga [dapagliflozin] Other (See Comments)   DKA   Metformin  And Related Diarrhea   Lost appetite and weight    Benadryl [diphenhydramine Hcl] Hypertension   Citalopram Other (See Comments)   Unknown    Tramadol  Other (See Comments)   Felt light headed and dizzy   Amlodipine  Swelling   Crestor  [rosuvastatin ] Swelling, Other (See Comments)   Feet swelling Makes her feel weak. Pt takes 5mg  at home        Medication List     TAKE these medications    Accu-Chek Guide Me w/Device Kit 1 each by Does not apply route as directed. Use to monitor blood glucose twice daily as directed.   Accu-Chek Guide Test test strip Generic drug: glucose blood 1 each by Other route as needed for other. Use to monitor glucose twice daily as instructed   Accu-Chek Softclix Lancets lancets 1 each by Other route as directed. Use to monitor blood glucose twice daily as instructed   albuterol  108 (90 Base) MCG/ACT inhaler Commonly known as: VENTOLIN  HFA Inhale 2 puffs into the lungs every 2 (two) hours as needed for wheezing or shortness of breath.   amLODipine  10 MG tablet Commonly known as: NORVASC  Take 10 mg by mouth daily.   carvedilol  12.5 MG tablet Commonly known as: COREG  Take 12.5 mg by mouth 2 (two) times daily.   cloNIDine  0.3 MG tablet Commonly known as: CATAPRES  Take 1 tablet (0.3 mg total) by mouth 3 (three) times daily.   collagenase  250 UNIT/GM ointment Commonly known as: SANTYL  Apply topically daily. Apply to 1/4 sacral/buttocks wounds. Start taking on: May 05, 2024   diltiazem  240 MG 24 hr  capsule Commonly known as: CARDIZEM  CD Take 1 capsule (240 mg total) by mouth daily.   hydrALAZINE  100 MG tablet Commonly known as: APRESOLINE  Take 1 tablet (100 mg total) by mouth 3 (three) times daily.   insulin  degludec 200 UNIT/ML FlexTouch Pen Commonly known as: TRESIBA  Inject 6 Units into the skin daily at 10 pm.   insulin  lispro 100 UNIT/ML KwikPen Commonly known as: HumaLOG  KwikPen Inject 0-12 Units into the skin 3 (three) times daily. Short acting Humalog  insulin  per sliding scale 0-12 ---:-- Insulin  injection 0-12 Units 0-12 Units Subcutaneous, 3 times daily with meals CBG 70 - 120: 0 unit CBG 121 - 150: 0 unit  CBG 151 - 200: 2 unit CBG 201 - 250: 4 units CBG 251 - 300: 6 units CBG 301 - 350: 8 units  CBG 351 - 400: 10 units  CBG > 400: 12 units   isosorbide  mononitrate 60 MG 24 hr tablet Commonly known as: IMDUR  Take 1 tablet (60 mg total) by mouth daily.   magnesium  oxide 400 (240 Mg) MG tablet Commonly known as: MAG-OX Take 1 tablet (400 mg total) by mouth daily.   potassium chloride  10 MEQ tablet Commonly known as: KLOR-CON  Take 10 mEq by mouth daily.   Restasis  0.05 % ophthalmic emulsion Generic drug: cycloSPORINE  Place 1 drop into both eyes daily.   rosuvastatin  5 MG tablet Commonly known as: CRESTOR  Take 1 tablet (5 mg total) by mouth daily.   torsemide  20 MG tablet Commonly known as: DEMADEX  Take 1  tablet (20 mg total) by mouth 2 (two) times daily. What changed:  how much to take when to take this               Discharge Care Instructions  (From admission, onward)           Start     Ordered   05/04/24 0000  Discharge wound care:       Comments: Cleanse sacral and buttocks wounds with Vashe wound cleanser Soila ([phone]) do not rinse and allow to air dry.  Apply 1/4 thick layer of Santyl  to wound bed, top with saline moistened gauze, dry gauze and secure with silicone foam or ABD pad and tape whichever is preferred.   05/04/24 1522             Follow-up Information     Antonetta Rollene BRAVO, MD. Schedule an appointment as soon as possible for a visit in 10 day(s).   Specialty: Family Medicine Contact information: [address], [address] [place] [address] [phone]                Discharge Exam: Fredricka Weights   05/02/24 0443 05/03/24 0401 05/04/24 0500  Weight: 53.1 kg 53.7 kg 54.6 kg   General exam: Alert, awake, oriented x 3; still complaining of short winded sensation with activity.  No nausea, no vomiting, no chest pain.  No oxygen supplementation require 13 desaturation screening. Respiratory system: No frank crackles, no using accessory muscle.  Good air movement bilaterally. Cardiovascular system:RRR. No rubs or gallops; no JVD. Gastrointestinal system: Abdomen is nondistended, soft and nontender. No organomegaly or masses felt. Normal bowel sounds heard. Central nervous system: Alert and oriented. No focal neurological deficits. Extremities: No cyanosis or clubbing; trace edema appreciated bilaterally. Skin: No petechiae.  Unstageable sacral pressure injury present at time of admission without signs of superimposed infection. Psychiatry: Judgement and insight appear normal. Mood & affect appropriate.   Condition at discharge: Stable and improved.  The results of significant diagnostics from this hospitalization (including imaging, microbiology, ancillary and laboratory) are listed below for reference.   Imaging Studies: DG Chest Portable 1 View Result Date: 04/29/2024 CLINICAL DATA:  Shortness of breath and rales. EXAM: PORTABLE CHEST 1 VIEW COMPARISON:  Chest x-ray 04/07/2024 FINDINGS: The heart is enlarged. There are atherosclerotic calcifications of the aorta. There are small bilateral pleural effusions. There is no lung consolidation or pneumothorax. No acute fractures are seen. IMPRESSION: Cardiomegaly with small bilateral pleural effusions. Electronically Signed   By: Greig Pique  M.D.   On: 04/29/2024 17:08   MR BRAIN WO CONTRAST Result Date: 04/16/2024 CLINICAL DATA:  Mental status change, unknown cause EXAM: MRI HEAD WITHOUT CONTRAST TECHNIQUE: Multiplanar, multiecho pulse sequences of the brain and surrounding structures were obtained without intravenous contrast. COMPARISON:  CT head 05/20/2023. FINDINGS: Brain: Hemosiderin and mild T2 hypointensity in the right cerebellum without edema or mass effect, compatible with the sequela of known prior hemorrhage. No evidence of acute hemorrhage, acute infarct, midline shift or hydrocephalus. Patchy T2/FLAIR hyperintensities the white matter, compatible with chronic microvascular ischemic disease. Cerebral atrophy. Vascular: Major arterial flow voids are maintained at the skull base. Skull and upper cervical spine: Normal marrow signal. Sinuses/Orbits: Partially opacified right sphenoid sinus. No acute orbital findings. Other: No mastoid effusions. IMPRESSION: 1. No evidence of acute intracranial abnormality. 2. Sequela of prior right cerebellar hemorrhage. 3. Chronic microvascular ischemic disease and cerebral atrophy (ICD10-.9). Electronically Signed   By: Gilmore GORMAN Joshua CHRISTELLA.D.  On: 04/16/2024 22:47   DG Chest Port 1 View Result Date: 04/07/2024 CLINICAL DATA:  Shortness of breath for several days. EXAM: PORTABLE CHEST 1 VIEW COMPARISON:  03/10/2024 FINDINGS: Stable mild cardiomegaly. Stable small bilateral pleural effusions. Both lungs are otherwise clear. IMPRESSION: Stable mild cardiomegaly and small bilateral pleural effusions. No new findings. Electronically Signed   By: Norleen DELENA Kil M.D.   On: 04/07/2024 15:43    Microbiology: Results for orders placed or performed during the hospital encounter of 04/07/24  MRSA Next Gen by PCR, Nasal     Status: None   Collection Time: 04/07/24  5:48 PM   Specimen: Nasal Mucosa; Nasal Swab  Result Value Ref Range Status   MRSA by PCR Next Gen NOT DETECTED NOT DETECTED Final    Comment:  (NOTE) The GeneXpert MRSA Assay (FDA approved for NASAL specimens only), is one component of a comprehensive MRSA colonization surveillance program. It is not intended to diagnose MRSA infection nor to guide or monitor treatment for MRSA infections. Test performance is not FDA approved in patients less than 32 years old. Performed at [facility], [address] [place], [place] [address]    *Note: Due to a large number of results and/or encounters for the requested time period, some results have not been displayed. A complete set of results can be found in Results Review.    Labs: CBC: Recent Labs  Lab 04/29/24 1634 04/30/24 0403  WBC 7.8 7.0  HGB 9.5* 8.3*  HCT 29.2* 26.3*  MCV 97.3 97.8  PLT 293 266   Basic Metabolic Panel: Recent Labs  Lab 04/29/24 1634 04/30/24 0403 04/30/24 0804 05/02/24 0403 05/03/24 1005 05/04/24 0333  NA 135 135  --  135 135 135  K 3.2* 3.2*  --  3.2* 3.5 3.1*  CL 91* 94*  --  95* 92* 95*  CO2 30 30  --  28 28 31   GLUCOSE 161* 53* 54* 329* 377* 167*  BUN 71* 69*  --  78* 81* 82*  CREATININE 2.98* 3.04*  --  3.29* 3.45* 3.38*  CALCIUM  8.7* 8.3*  --  8.3* 8.5* 8.3*  MG 2.4  --   --   --   --   --    Liver Function Tests: Recent Labs  Lab 04/29/24 1646  AST 28  ALT 15  ALKPHOS 91  BILITOT 0.6  PROT 5.6*  ALBUMIN  2.8*   CBG: Recent Labs  Lab 05/03/24 2134 05/03/24 2344 05/04/24 0407 05/04/24 0743 05/04/24 1114  GLUCAP 241* 234* 154* 147* 230*    Discharge time spent:  35 minutes.  Signed: Eric Nunnery, MD [facility] 05/04/2024

## 2024-05-05 ENCOUNTER — Telehealth: Payer: Self-pay

## 2024-05-05 NOTE — Transitions of Care (Post Inpatient/ED Visit) (Signed)
   05/05/2024  Name: Emily Jennings MRN: [id number] DOB: March 03, 1934  Today's TOC FU Call Status: Today's TOC FU Call Status:: Unsuccessful Call (1st Attempt) Unsuccessful Call (1st Attempt) Date: 05/05/24  Attempted to reach the patient regarding the most recent Inpatient/ED visit. TOC RN CM left confidential voice mail with call back number.   Follow Up Plan: Additional outreach attempts will be made to reach the patient to complete the Transitions of Care (Post Inpatient/ED visit) call.    Bing Edison MSN, RN RN Case Sales executive Health  [facility] Office Hours M-F [phone] Direct Dial : [phone] Main Phone [phone]  Fax: [phone] [email or web address]

## 2024-05-06 ENCOUNTER — Telehealth: Payer: Self-pay | Admitting: *Deleted

## 2024-05-06 NOTE — Transitions of Care (Post Inpatient/ED Visit) (Signed)
   05/06/2024  Name: Emily Jennings MRN: [id number] DOB: 06-13-1934  Today's TOC FU Call Status: Today's TOC FU Call Status:: Unsuccessful Call (2nd Attempt) Unsuccessful Call (2nd Attempt) Date: 05/06/24  Attempted to reach the patient regarding the most recent Inpatient/ED visit.  Follow Up Plan: Additional outreach attempts will be made to reach the patient to complete the Transitions of Care (Post Inpatient/ED visit) call.   Cathlean Headland BSN RN [facility] Health Care Management Coordinator [email or web address] Direct Dial : [phone]  Fax: [phone] Website: [email or web address]

## 2024-05-06 NOTE — Transitions of Care (Post Inpatient/ED Visit) (Signed)
   05/06/2024  Name: Emily Jennings MRN: [id number] DOB: [birth year]  Today's TOC FU Call Status: Today's TOC FU Call Status:: Successful TOC FU Call Completed TOC FU Call Complete Date: 05/06/24 Patient's Name and Date of Birth confirmed.  Transition Care Management Follow-up Telephone Call Date of Discharge: 05/04/24 [facility] Primary Inpatient Discharge Diagnosis:: heart failure with preserved ejection fraction How have you been since you were released from the hospital?: Better Any questions or concerns?: No  Items Reviewed: Did you receive and understand the discharge instructions provided?: Yes Medications obtained,verified, and reconciled?: No Medications Not Reviewed Reasons:: Other: (Husband understood all medicines/ Declined going over) Any new allergies since your discharge?: No Dietary orders reviewed?: Yes Type of Diet Ordered:: low sodium heart healthy Do you have support at home?: Yes People in Home [RPT]: spouse Name of Support/Comfort Primary Source: [name]  Medications Reviewed Today: Medications Reviewed Today   Medications were not reviewed in this encounter     Home Care and Equipment/Supplies: Were Home Health Services Ordered?: NA Any new equipment or medical supplies ordered?: NA  Functional Questionnaire: Do you need assistance with bathing/showering or dressing?: Yes Do you need assistance with meal preparation?: Yes Do you need assistance with eating?: No Do you have difficulty maintaining continence: No Do you need assistance with getting out of bed/getting out of a chair/moving?: Yes Do you have difficulty managing or taking your medications?: No  Follow up appointments reviewed: PCP Follow-up appointment confirmed?: No MD Provider Line Number:[phone] Given: Yes (Husband will follow up with making PCP appointment) Specialist Hospital Follow-up appointment confirmed?: Yes Date of Specialist follow-up appointment?:  05/21/24 Follow-Up Specialty Provider:: [name] Do you need transportation to your follow-up appointment?: No Do you understand care options if your condition(s) worsen?: Yes-patient verbalized understanding  SDOH Interventions Today    Flowsheet Row Most Recent Value  SDOH Interventions   Food Insecurity Interventions Intervention Not Indicated  Housing Interventions Intervention Not Indicated  Transportation Interventions Intervention Not Indicated, [name]

## 2024-05-08 NOTE — Progress Notes (Signed)
 PROGRESS NOTE    Patient: Emily Jennings                            PCP: Antonetta Rollene BRAVO, MD                    DOB: Dec 31, 1933            DOA: 04/07/2024 FMW:[id number]             DOS: 04/15/2024   LOS: 14 days   Late entry progress note  Date of Service: The patient was seen and examined on 04/15/2024  Subjective:   The patient was seen and examined this morning. Hemodynamically stable. No issues overnight .  Brief Narrative:   89 y.o. female with medical history significant for CHF, diabetes mellitus, hypertension, CKD 4.  Patient presented to the ED with complaints of difficulty breathing over the past 2 days, swelling to bilateral lower extremities up to her thighs, swelling of her arms.  She reports weight gain from 128-135 today.  She reports compliance with her medication-torsemide  60/40mg . Torsemide  dose was increased 7/15.   Recent hospitalizations for same-most recent 6/23 to 6/25.   ED Course: Temperature 92.8.  Heart rate 56-64.  Respirate rate 11-25.  Blood pressure systolic 143-174.  O2 sat greater than 95% on room air.  BNP 962.  Troponin 6 > 7.  Chest x-ray small bilateral pleural effusions.  Lasix  60 mg x 1 given    Assessment & Plan:   Principal Problem:   Acute on chronic congestive heart failure with left ventricular diastolic dysfunction (HCC) Active Problems:   Hypothermia   Hyponatremia   Diabetes (HCC)   CKD (chronic kidney disease) stage 4, GFR 15-29 ml/min (HCC)   DNR (do not resuscitate)   Essential hypertension   Palliative care by specialist   Assessment and Plan:   Decompensated  bradycardia  and hypotension  -on 04/13/2024 around 1830 Patient's heart rate was around 40, blood pressure 77/38 - Patient was given atropine  x 1, midodrine  10 mg p.o. x 1, 500 mL of bolus saline - Was transferred to ICU overnight for close observation   -Patient received diltiazem  90 mg x 1 on 04/13/2024 - Current medication of hydralazine /isosorbide ,  labetalol  and clonidine  and torsemide  was discontinued   Cardiology and nephrology following the patient closely: Restarted patient on:  Coreg  3.125 mg p.o. twice daily,  hydralazine  50 mg 3 times daily  Lasix  40 mg IV twice daily    Acute on chronic HFpEF  Question of cardiorenal syndrome - Progressive heart failure-  - Developing progressive cardiorenal syndrome -Cardiology nephrology consulted   -During this admission patient was treated aggressively with diuretics  Lasix  120 mg every 8 hours Till 04/12/24  switched to Demadex  40 mg twice daily on 7/27 Back to IV Lasix  40 mg twice daily started 04/14/2024, increased to 120 mg BID by nephrologist on 7/30        Filed Weights    04/14/24 1445 04/15/24 0500   Weight: 59.8 kg 59 kg        -Following daily weights, strict I's and O's, Reds clip measurements and low-sodium diet. - Recent 2D echo demonstrating ejection fraction 70 to 75% with grade 2 diastolic dysfunction (no need to be repeated).   - Improved anasarca, improved orthopnea   Hypertensive crisis -Persistent/resistant hypertension with bradycardia -Much improved -Patient continued on Cardizem  30 mg every 6 hours, hydralazine  increased  to 100 mg every 8 hours, continue Lasix  80 mg IV twice daily x 1 day  Was on clonidine /hydralazine /labetalol /Cardizem -was discontinued 04/13/2024 - Restarting Coreg , hydralazine , IV Lasix       Type 2 diabetes mellitus with nephropathy - A1c 7.4 - CBG q. ACHS, SSI coverage - discontinued semglee  due to severe hypoglycemia - added renal dose prandial coverage novolog  (2 units TID with meals eaten>50%)  CBG (last 3)  Recent Labs (last 2 labs)       Recent Labs    04/15/24 2119    GLUCAP 251*        Chronic kidney disease stage IV - Monitoring closely -Nephrology following-no mass dialysis -Continue to follow renal function trend stability especially with ongoing IV diuresis. - Follow electrolytes and replete them as  needed. Recent Labs           Lab Results  Component Value Date    CREATININE 2.83 (H) 04/14/2024    CREATININE 2.60 (H) 04/13/2024    CREATININE 2.59 (H) 04/12/2024    Nephrology -appreciate follow-up and recommendations    Hyponatremia -Much improved - Continue treatment with diuretics - Follow electrolytes trend - Patient asymptomatic. - Serum sodium level: 123, 123, 125, 126, 129, 132  - Fluid restriction 1200 mL per day   Hypokalemia/hypomagnesemia K 3.2, 2.8, 2.5,>>> 3.9, 3.0 -Mag 2.2 -Monitoring and repleting   Anemia of chronic disease due to chronic kidney disease -Monitoring H&H closely - Follow-up with iron  studies - Nephrology added Aranesp  60 mg q. Weekly  - 04/14/2024 transfusing 1U PRBC     Ethics: Chart reviewed, ACP reviewed, patient has goldenrod, patient is DNR Given the above comorbidities, continued decline-poor prognosis noted Consulting palliative care for further discussion regarding goals of care         Consultant : Nephrology/cardiology/palliative care team       ----------------------------------------------------------------------------------------------------------------------------------------------- Nutritional status:  The patient's BMI is: Body mass index is 26.01 kg/m. I agree with the assessment and plan as outlined  Nutrition Status:           Skin Assessment: I have examined the patient's skin and I agree with the wound assessment as performed by wound care team As outlined belowe: Pressure Injury 02/06/24 Coccyx Medial;Upper Stage 2 -  Partial thickness loss of dermis presenting as a shallow open injury with a red, pink wound bed without slough. 2x2 (Active)  02/06/24 1200  Location: Coccyx  Location Orientation: Medial;Upper  Staging: Stage 2 -  Partial thickness loss of dermis presenting as a shallow open injury with a red, pink wound bed without slough.  Wound Description (Comments): 2x2  DO NOT USE:  Present  on Admission:   Dressing Type Foam - Lift dressing to assess site every shift 05/04/24 0500        ------------------------------------------------------------------------------------------------------------------------------------------------  DVT prophylaxis:     Code Status:   Code Status: Prior  Family Communication: No family member present at bedside-  -Advance care planning has been discussed.   Admission status:   Inpatient   Disposition: From  - home             Planning for discharge in 1-2 days   Procedures:   No admission procedures for hospital encounter.   Antimicrobials:  Anti-infectives (From admission, onward)    None        Medication:       Objective:   Vitals:   04/20/24 2006 04/20/24 2311 04/21/24 0604 04/21/24 1354  BP: (!) 174/62  (!) 220/83 ROLLEN)  188/71  Pulse: 62  80 71  Resp: 17  18 14   Temp: 97.7 F (36.5 C)  98.1 F (36.7 C) 98.6 F (37 C)  TempSrc: Oral  Oral Oral  SpO2: 100%  99% 100%  Weight:  60.4 kg    Height:       No intake or output data in the 24 hours ending 05/08/24 1125 Filed Weights   04/19/24 0420 04/20/24 0331 04/20/24 2311  Weight: 55.1 kg 56.3 kg 60.4 kg     Physical examination:   General:  AAO x 3,  cooperative, no distress;   HEENT:  Normocephalic, PERRL, otherwise with in Normal limits   Neuro:  CNII-XII intact. , normal motor and sensation, reflexes intact   Lungs:   Clear to auscultation BL, Respirations unlabored,  No wheezes / crackles  Cardio:    S1/S2, RRR, No murmure, No Rubs or Gallops   Abdomen:  Soft, non-tender, bowel sounds active all four quadrants, no guarding or peritoneal signs.  Muscular  skeletal:  Limited exam -global generalized weaknesses - in bed, able to move all 4 extremities,   2+ pulses,  symmetric, No pitting edema  Skin:  Dry, warm to touch, negative for any Rashes,  Wounds: Please see nursing documentation  Pressure Injury 02/06/24 Coccyx Medial;Upper Stage  2 -  Partial thickness loss of dermis presenting as a shallow open injury with a red, pink wound bed without slough. 2x2 (Active)  02/06/24 1200  Location: Coccyx  Location Orientation: Medial;Upper  Staging: Stage 2 -  Partial thickness loss of dermis presenting as a shallow open injury with a red, pink wound bed without slough.  Wound Description (Comments): 2x2  DO NOT USE:  Present on Admission:   Dressing Type Foam - Lift dressing to assess site every shift 05/04/24 0500       ------------------------------------------------------------------------------------------------------------------------------------------    LABs:     Latest Ref Rng & Units 04/30/2024    4:03 AM 04/29/2024    4:34 PM 04/19/2024    6:09 AM  CBC  WBC 4.0 - 10.5 K/uL 7.0  7.8  7.0   Hemoglobin 12.0 - 15.0 g/dL 8.3  9.5  8.9   Hematocrit 36.0 - 46.0 % 26.3  29.2  27.0   Platelets 150 - 400 K/uL 266  293  98       Latest Ref Rng & Units 05/04/2024    3:33 AM 05/03/2024   10:05 AM 05/02/2024    4:03 AM  CMP  Glucose 70 - 99 mg/dL 832  622  670   BUN 8 - 23 mg/dL 82  81  78   Creatinine 0.44 - 1.00 mg/dL 6.61  6.54  6.70   Sodium 135 - 145 mmol/L 135  135  135   Potassium 3.5 - 5.1 mmol/L 3.1  3.5  3.2   Chloride 98 - 111 mmol/L 95  92  95   CO2 22 - 32 mmol/L 31  28  28    Calcium  8.9 - 10.3 mg/dL 8.3  8.5  8.3        Micro Results No results found for this or any previous visit (from the past 240 hours).  Radiology Reports No results found.  SIGNED: Adriana DELENA Grams, MD, FHM. FAAFP. Jolynn Pack - [facility] Time spent - 55 min.  In seeing, evaluating and examining the patient. Reviewing medical records, labs, drawn plan of care. [facility],  Pager (please use [email or web address] to page/ text) Please use Epic  Secure Chat for non-urgent communication (7AM-7PM)  If 7PM-7AM, please contact night-coverage [email or web address], This is a late entry note for 04/15/2024

## 2024-05-14 DIAGNOSIS — Z794 Long term (current) use of insulin: Secondary | ICD-10-CM | POA: Diagnosis not present

## 2024-05-14 DIAGNOSIS — J9811 Atelectasis: Secondary | ICD-10-CM | POA: Diagnosis not present

## 2024-05-14 DIAGNOSIS — N184 Chronic kidney disease, stage 4 (severe): Secondary | ICD-10-CM | POA: Diagnosis not present

## 2024-05-14 DIAGNOSIS — E1122 Type 2 diabetes mellitus with diabetic chronic kidney disease: Secondary | ICD-10-CM | POA: Diagnosis not present

## 2024-05-14 DIAGNOSIS — I110 Hypertensive heart disease with heart failure: Secondary | ICD-10-CM | POA: Diagnosis not present

## 2024-05-14 DIAGNOSIS — Z9181 History of falling: Secondary | ICD-10-CM | POA: Diagnosis not present

## 2024-05-14 DIAGNOSIS — K219 Gastro-esophageal reflux disease without esophagitis: Secondary | ICD-10-CM | POA: Diagnosis not present

## 2024-05-14 DIAGNOSIS — E861 Hypovolemia: Secondary | ICD-10-CM | POA: Diagnosis not present

## 2024-05-14 DIAGNOSIS — E871 Hypo-osmolality and hyponatremia: Secondary | ICD-10-CM | POA: Diagnosis not present

## 2024-05-14 DIAGNOSIS — M810 Age-related osteoporosis without current pathological fracture: Secondary | ICD-10-CM | POA: Diagnosis not present

## 2024-05-14 DIAGNOSIS — E876 Hypokalemia: Secondary | ICD-10-CM | POA: Diagnosis not present

## 2024-05-14 DIAGNOSIS — R1310 Dysphagia, unspecified: Secondary | ICD-10-CM | POA: Diagnosis not present

## 2024-05-14 DIAGNOSIS — G4700 Insomnia, unspecified: Secondary | ICD-10-CM | POA: Diagnosis not present

## 2024-05-14 DIAGNOSIS — I5033 Acute on chronic diastolic (congestive) heart failure: Secondary | ICD-10-CM | POA: Diagnosis not present

## 2024-05-14 DIAGNOSIS — Z86000 Personal history of in-situ neoplasm of breast: Secondary | ICD-10-CM | POA: Diagnosis not present

## 2024-05-14 DIAGNOSIS — I2510 Atherosclerotic heart disease of native coronary artery without angina pectoris: Secondary | ICD-10-CM | POA: Diagnosis not present

## 2024-05-14 DIAGNOSIS — I4729 Other ventricular tachycardia: Secondary | ICD-10-CM | POA: Diagnosis not present

## 2024-05-14 DIAGNOSIS — D631 Anemia in chronic kidney disease: Secondary | ICD-10-CM | POA: Diagnosis not present

## 2024-05-15 ENCOUNTER — Encounter (HOSPITAL_COMMUNITY): Payer: Self-pay | Admitting: *Deleted

## 2024-05-15 ENCOUNTER — Other Ambulatory Visit: Payer: Self-pay

## 2024-05-15 ENCOUNTER — Telehealth: Payer: Self-pay | Admitting: Family Medicine

## 2024-05-15 ENCOUNTER — Emergency Department (HOSPITAL_COMMUNITY)
Admission: EM | Admit: 2024-05-15 | Discharge: 2024-05-19 | Disposition: E | Attending: Emergency Medicine | Admitting: Emergency Medicine

## 2024-05-15 DIAGNOSIS — I2510 Atherosclerotic heart disease of native coronary artery without angina pectoris: Secondary | ICD-10-CM | POA: Insufficient documentation

## 2024-05-15 DIAGNOSIS — I469 Cardiac arrest, cause unspecified: Secondary | ICD-10-CM | POA: Diagnosis not present

## 2024-05-15 DIAGNOSIS — E119 Type 2 diabetes mellitus without complications: Secondary | ICD-10-CM | POA: Insufficient documentation

## 2024-05-15 DIAGNOSIS — Z853 Personal history of malignant neoplasm of breast: Secondary | ICD-10-CM | POA: Diagnosis not present

## 2024-05-15 DIAGNOSIS — I10 Essential (primary) hypertension: Secondary | ICD-10-CM | POA: Insufficient documentation

## 2024-05-18 DIAGNOSIS — L89150 Pressure ulcer of sacral region, unstageable: Secondary | ICD-10-CM | POA: Diagnosis not present

## 2024-05-19 NOTE — ED Notes (Signed)
 Pt's family states they would like to use [facility] in [place], [place]

## 2024-05-19 NOTE — ED Notes (Addendum)
 Pt placement called and pt transported to the morgue

## 2024-05-19 NOTE — ED Triage Notes (Signed)
 Pt BIB family from home; family reports pt was unresponsive; ED staff found pt pulseless in car and started compressions upon arrival  Pt brought to room and CPR continued  Epi 1mg  given at 1313 Pulse check 1313, no pulse Bicarb given 1314 Pulse check 1315, no pulse Epi 1mg  given at 1316  Family arrived and pt found to be on pallative care and no other lifesaving measures wanted by family   TOD at 28

## 2024-05-19 NOTE — ED Provider Notes (Signed)
 [place] EMERGENCY DEPARTMENT AT [facility] Provider Note  CSN: [id number] Arrival date & time: 06-05-24 1313  Chief Complaint(s) Cardiac Arrest  HPI Emily Jennings is a 89 y.o. female history of CHF, diabetes presenting with cardiac arrest.  Patient per husband has been sick for a while.  Thinks that he last saw her around 9.  He then saw her again right before coming to the hospital and thinks that she might of been deceased at that time.  He then drove to the hospital.  Patient was moved to a room and CPR was immediately started by nursing.  History limited due to acuity of condition.  Past Medical History Past Medical History:  Diagnosis Date   Allergy    Anxiety    ANXIETY DISORDER, GENERALIZED 07/15/2007   Qualifier: Diagnosis of  By: Aisha Motes     Arthritis    Cancer [facility]) 2009   breast, carcinoma in situ left   Carcinoma in situ of breast 05/21/2008   Qualifier: Diagnosis of  By: Antonetta MD, Margaret  Diagnosed in 2009, completed 5 year course of tamoxifen, no evidence of recurrence    Carotid stenosis    11/16/2005  mild plaque formation and stenosis proximal right ECA   Cataract    Complication of anesthesia    Coronary artery disease    cardiac catheterization on 03/20/2006  LAD mid 40% stenosis, left circumflex mild 40% stenosis, RCA mid-vessel 40% to 50% lesion   EF 60%   Diabetes mellitus    GERD (gastroesophageal reflux disease)    Hernia, inguinal    left   Hyperglycemia    Hypertension    Insomnia 11/16/2011   Low blood potassium    Non-insulin  dependent type 2 diabetes mellitus (HCC)    Osteoporosis    Shortness of breath    2D Echocardiogram 01/26/2009   EF of greater than 55%, mild MR, mild TR, normal ventricular function   Thickened endometrium 10/26/2017   Noted by gyne in 2017, missed 6 month follow up, referred in 09/2017   Ventricular tachycardia, non-sustained (HCC)    developed during stress test 02/08/2006, spontaneously aborted,  mild reversible apical defect   Patient Active Problem List   Diagnosis Date Noted   Anasarca 05/04/2024   (HFpEF) heart failure with preserved ejection fraction (HCC) 04/29/2024   Palliative care by specialist 04/21/2024   Anemia due to chronic kidney disease 02/04/2024   Type 2 diabetes mellitus with hyperglycemia (HCC) 01/22/2024   Insulin  long-term use (HCC) 12/05/2023   Type 2 diabetes mellitus with other specified complication (HCC) 09/25/2023   Acute kidney injury superimposed on stage 4 chronic kidney disease (HCC) 08/14/2023   Small bowel obstruction (HCC) 07/19/2023   Ischemic bowel disease (HCC) 07/19/2023   Incarcerated left inguinal hernia 07/18/2023   Hypoglycemia 07/18/2023   Uncontrolled type 2 diabetes mellitus with hypoglycemia, with long-term current use of insulin  (HCC) 07/04/2023   Dysphagia 07/04/2023   HCAP (healthcare-associated pneumonia) 07/03/2023   Essential hypertension 06/15/2023   Cerebellar edema (HCC) 05/23/2023   Mixed hyperlipidemia 05/23/2023   DNR (do not resuscitate) 05/17/2023   Vocal cord dysfunction 05/17/2023   DNR (do not resuscitate) discussion 05/17/2023   Goals of care, counseling/discussion 05/17/2023   Right cerebellar intraparenchymal hemorrhage due to hypertension 05/16/2023   Unsteady gait when walking 04/03/2023   Hospital discharge follow-up 03/06/2023   Bilateral kidney stones 03/06/2023   Hip swelling, right 02/27/2023   DKA (diabetic ketoacidosis) (HCC) 02/17/2023   High  anion gap metabolic acidosis 02/17/2023   Pseudohyponatremia 02/17/2023   Hypoalbuminemia due to protein-calorie malnutrition (HCC) 02/17/2023   Dehydration 02/17/2023   Hip pain 02/15/2023   Encounter for Medicare annual examination with abnormal findings 10/16/2022   Vitamin D  deficiency 10/16/2022   Acute on chronic congestive heart failure with left ventricular diastolic dysfunction (HCC) 09/13/2022   Chronic kidney disease (CKD) stage G3b/A2,  moderately decreased glomerular filtration rate (GFR) between 30-44 mL/min/1.73 square meter and albuminuria creatinine ratio between 30-299 mg/g (HCC) 09/13/2022   Mild aortic valve stenosis 09/13/2022   Dyspnea on exertion 09/05/2022   CHF (congestive heart failure) (HCC) 09/05/2022   Hyperkalemia 04/09/2022   Encounter for support and coordination of transition of care 04/09/2022   Hyperglycemic crisis due to diabetes mellitus (HCC) 03/25/2022   Hyponatremia 03/25/2022   Acute metabolic encephalopathy 03/22/2022   Hypothermia 03/22/2022   Heart murmur 03/22/2022   Bilateral lower extremity edema 03/22/2022   Syncope and collapse 01/13/2021   Lip swelling 10/30/2020   CKD (chronic kidney disease) stage 4, GFR 15-29 ml/min (HCC) 10/30/2020   Tubular adenoma of colon 10/26/2017   Diabetes (HCC) 07/27/2016   Osteoporosis 07/28/2014   Diabetic hypertension-nephrosis syndrome (HCC) 03/23/2014   Type 2 diabetes mellitus with stage 3a chronic kidney disease, with long-term current use of insulin  (HCC) 09/29/2013   Allergic rhinitis 01/27/2013   GERD (gastroesophageal reflux disease) 01/12/2013   CVD (cardiovascular disease) 07/17/2011   Physical deconditioning 03/04/2010   Hypokalemia 08/31/2008   Hyperlipidemia LDL goal <100 07/15/2007   Resistant hypertension 07/15/2007   Home Medication(s) Prior to Admission medications   Medication Sig Start Date End Date Taking? Authorizing Provider  Accu-Chek Softclix Lancets lancets 1 each by Other route as directed. Use to monitor blood glucose twice daily as instructed 04/22/24   [name], Gebreselassie W, MD  albuterol  (VENTOLIN  HFA) 108 (90 Base) MCG/ACT inhaler Inhale 2 puffs into the lungs every 2 (two) hours as needed for wheezing or shortness of breath. 02/13/24   Pearlean, Courage, MD  amLODipine  (NORVASC ) 10 MG tablet Take 10 mg by mouth daily. 04/26/24   [provider]  Blood Glucose Monitoring Suppl (ACCU-CHEK GUIDE ME) w/Device KIT 1  each by Does not apply route as directed. Use to monitor blood glucose twice daily as directed. 04/22/24   [name], Gebreselassie W, MD  carvedilol  (COREG ) 12.5 MG tablet Take 12.5 mg by mouth 2 (two) times daily. 04/26/24   [provider]  cloNIDine  (CATAPRES ) 0.3 MG tablet Take 1 tablet (0.3 mg total) by mouth 3 (three) times daily. 04/21/24   Johnson, Clanford L, MD  collagenase  (SANTYL ) 250 UNIT/GM ointment Apply topically daily. Apply to 1/4 sacral/buttocks wounds. 05/05/24   Ricky Fines, MD  diltiazem  (CARDIZEM  CD) 240 MG 24 hr capsule Take 1 capsule (240 mg total) by mouth daily. 04/22/24   Johnson, Clanford L, MD  glucose blood (ACCU-CHEK GUIDE TEST) test strip 1 each by Other route as needed for other. Use to monitor glucose twice daily as instructed 04/22/24   [name], Gebreselassie W, MD  hydrALAZINE  (APRESOLINE ) 100 MG tablet Take 1 tablet (100 mg total) by mouth 3 (three) times daily. 02/13/24   Pearlean Manus, MD  insulin  degludec (TRESIBA ) 200 UNIT/ML FlexTouch Pen Inject 6 Units into the skin daily at 10 pm. 02/13/24   Pearlean Manus, MD  insulin  lispro (HUMALOG  KWIKPEN) 100 UNIT/ML KwikPen Inject 0-12 Units into the skin 3 (three) times daily. Short acting Humalog  insulin  per sliding scale 0-12 ---:-- Insulin   injection 0-12 Units 0-12 Units Subcutaneous, 3 times daily with meals CBG 70 - 120: 0 unit CBG 121 - 150: 0 unit  CBG 151 - 200: 2 unit CBG 201 - 250: 4 units CBG 251 - 300: 6 units CBG 301 - 350: 8 units  CBG 351 - 400: 10 units  CBG > 400: 12 units 02/13/24   Emokpae, Courage, MD  isosorbide  mononitrate (IMDUR ) 60 MG 24 hr tablet Take 1 tablet (60 mg total) by mouth daily. 02/13/24   Pearlean Manus, MD  magnesium  oxide (MAG-OX) 400 (240 Mg) MG tablet Take 1 tablet (400 mg total) by mouth daily. 02/13/24   Pearlean Manus, MD  potassium chloride  (KLOR-CON ) 10 MEQ tablet Take 10 mEq by mouth daily. 04/26/24   [provider]  RESTASIS  0.05 % ophthalmic emulsion Place 1 drop  into both eyes daily. 11/01/23   [provider]  rosuvastatin  (CRESTOR ) 5 MG tablet Take 1 tablet (5 mg total) by mouth daily. 02/13/24   Pearlean Manus, MD  torsemide  (DEMADEX ) 20 MG tablet Take 1 tablet (20 mg total) by mouth 2 (two) times daily. 05/04/24   Ricky Fines, MD                                                                                                                                    Past Surgical History Past Surgical History:  Procedure Laterality Date   BOWEL RESECTION Left 07/19/2023   Procedure: SMALL BOWEL RESECTION;  Surgeon: Kallie Manuelita BROCKS, MD;  Location: [facility];  Service: General;  Laterality: Left;   BREAST LUMPECTOMY Left 2009   Left breast 2009   CATARACT EXTRACTION W/PHACO Left 10/28/2014   Procedure: PHACO EMULSION CATARACT EXTRACTION WITH INTRAOCULAR LENS IMPLANT LEFT EYE (IOC);  Surgeon: Gaither Quan, MD;  Location: [facility] OR;  Service: Ophthalmology;  Laterality: Left;   COLONOSCOPY     cyst removed from left foot     INGUINAL HERNIA REPAIR Left 07/19/2023   Procedure: HERNIA REPAIR FEMORAL INCARCERATED;  Surgeon: Kallie Manuelita BROCKS, MD;  Location: [facility];  Service: General;  Laterality: Left;   REFRACTIVE SURGERY Left    Family History Family History  Problem Relation Age of Onset   Hypertension Mother    Hyperlipidemia Mother    Stroke Mother    Urticaria Mother    Cancer Father        pancreatic   Colon cancer Father    Heart disease Brother 40       bypass   Heart disease Brother 73       bypass   Arthritis Other    Asthma Other    Diabetes Other    Colon cancer Paternal Aunt    Esophageal cancer Neg Hx    Stomach cancer Neg Hx    Rectal cancer Neg Hx     Social History Social History   Tobacco Use   Smoking status: Never  Passive exposure: Never   Smokeless tobacco: Never  Vaping Use   Vaping status: Never Used  Substance Use Topics   Alcohol use: No   Drug use: No   Allergies Farxiga [dapagliflozin],  Metformin  and related, Benadryl [diphenhydramine hcl], Citalopram, Tramadol , Amlodipine , and Crestor  [rosuvastatin ]  Review of Systems Review of Systems  Unable to perform ROS: Acuity of condition    Physical Exam Vital Signs  I have reviewed the triage vital signs There were no vitals taken for this visit. Physical Exam Vitals and nursing note reviewed.  HENT:     Head: Normocephalic and atraumatic.     Right Ear: External ear normal.     Left Ear: External ear normal.     Nose: Nose normal.     Mouth/Throat:     Mouth: Mucous membranes are dry.  Eyes:     Comments: Pupils fixed and dilated  Cardiovascular:     Comments: Asystole, no palpable pulses Pulmonary:     Comments: Coarse breath sounds bilaterally Abdominal:     General: Abdomen is flat.  Skin:    General: Skin is dry.  Neurological:     Comments: Unresponsive     ED Results and Treatments Labs (all labs ordered are listed, but only abnormal results are displayed) Labs Reviewed - No data to display                                                                                                                        Radiology No results found.  Pertinent labs & imaging results that were available during my care of the patient were reviewed by me and considered in my medical decision making (see MDM for details).  Medications Ordered in ED Medications - No data to display                                                                                                                                   Procedures .Critical Care  Performed by: Francesca Elsie CROME, MD Authorized by: Francesca Elsie CROME, MD   Critical care provider statement:    Critical care time (minutes):  30   Critical care was necessary to treat or prevent imminent or life-threatening deterioration of the following conditions:  Cardiac failure   Critical care was time spent personally by me on the following activities:  Development of  treatment plan with patient or surrogate, discussions with  consultants, evaluation of patient's response to treatment, examination of patient, ordering and review of laboratory studies, ordering and review of radiographic studies, ordering and performing treatments and interventions, pulse oximetry, re-evaluation of patient's condition and review of old charts   (including critical care time)  Medical Decision Making / ED Course   MDM:  89 year old brought in by family via POV as cardiac arrest.  Patient was immediately brought to treatment room and CPR was initiated by nursing.  IV was placed and ACLS was initiated.  Patient received 3 rounds of CPR, multiple doses of epi.  Patient remained pulseless.  Airway attempted by anesthesia but unsuccessful on first attempt due to secretion so bagging continued.  Monitor showed asystole. Husband was brought back and notified chaplain that she would not want aggressive treatment or aggressive measures.  This was confirmed with husband who reported that he would not want us  to perform further CPR.  At this point further resuscitative efforts were terminated and patient declared dead around 1:24 PM.      Additional history obtained: -Additional history obtained from family -External records from outside source obtained and reviewed including: Chart review including previous notes, labs, imaging, consultation notes including prior notes     Medicines ordered and prescription drug management: No orders of the defined types were placed in this encounter.   -I have reviewed the patients home medicines and have made adjustments as needed    Cardiac Monitoring: The patient was maintained on a cardiac monitor.  I personally viewed and interpreted the cardiac monitored which showed an underlying rhythm of: asystole  Co morbidities that complicate the patient evaluation  Past Medical History:  Diagnosis Date   Allergy    Anxiety    ANXIETY  DISORDER, GENERALIZED 07/15/2007   Qualifier: Diagnosis of  By: Aisha Motes     Arthritis    Cancer [facility]) 2009   breast, carcinoma in situ left   Carcinoma in situ of breast 05/21/2008   Qualifier: Diagnosis of  By: Antonetta MD, Margaret  Diagnosed in 2009, completed 5 year course of tamoxifen, no evidence of recurrence    Carotid stenosis    11/16/2005  mild plaque formation and stenosis proximal right ECA   Cataract    Complication of anesthesia    Coronary artery disease    cardiac catheterization on 03/20/2006  LAD mid 40% stenosis, left circumflex mild 40% stenosis, RCA mid-vessel 40% to 50% lesion   EF 60%   Diabetes mellitus    GERD (gastroesophageal reflux disease)    Hernia, inguinal    left   Hyperglycemia    Hypertension    Insomnia 11/16/2011   Low blood potassium    Non-insulin  dependent type 2 diabetes mellitus (HCC)    Osteoporosis    Shortness of breath    2D Echocardiogram 01/26/2009   EF of greater than 55%, mild MR, mild TR, normal ventricular function   Thickened endometrium 10/26/2017   Noted by gyne in 2017, missed 6 month follow up, referred in 09/2017   Ventricular tachycardia, non-sustained (HCC)    developed during stress test 02/08/2006, spontaneously aborted, mild reversible apical defect      Dispostion: Disposition decision including need for hospitalization was considered, and patient Expired    Final Clinical Impression(s) / ED Diagnoses Final diagnoses:  Cardiac arrest [facility])     This chart was dictated using voice recognition software.  Despite best efforts to proofread,  errors can occur which can change the documentation meaning.  Francesca Elsie CROME, MD May 27, 2024 1329

## 2024-05-19 NOTE — ED Notes (Signed)
 Pt arrived with only top part of dentures, dentures placed in denture cup with patient label and placed in body bag with pt--pts family has a funeral home they would like pt to be transported to

## 2024-05-19 NOTE — ED Notes (Addendum)
 [facility] called per protocol--[facility] stated pt may be a potential candidate for possible tissue donation, call back number given to [facility] per request. Case/reference number given: [id number]

## 2024-05-19 NOTE — Telephone Encounter (Signed)
 We have been alerted by [facility] that Ms. Bamford has passed away due to cardiac. Her time of death was 13:18 on June 06, 2024

## 2024-05-19 DEATH — deceased

## 2024-05-21 ENCOUNTER — Ambulatory Visit: Admitting: Student

## 2024-07-22 ENCOUNTER — Ambulatory Visit

## 2024-07-23 ENCOUNTER — Ambulatory Visit: Admitting: Family Medicine
